# Patient Record
Sex: Male | Born: 1955
Health system: Southern US, Community
[De-identification: ages and names within clinical notes are randomized; demographics above are authoritative.]

## PROBLEM LIST (undated history)

## (undated) DIAGNOSIS — I10 Essential (primary) hypertension: Secondary | ICD-10-CM

## (undated) DIAGNOSIS — F32A Depression, unspecified: Secondary | ICD-10-CM

## (undated) DIAGNOSIS — I4821 Permanent atrial fibrillation: Secondary | ICD-10-CM

## (undated) DIAGNOSIS — I2510 Atherosclerotic heart disease of native coronary artery without angina pectoris: Secondary | ICD-10-CM

## (undated) DIAGNOSIS — Z9889 Other specified postprocedural states: Secondary | ICD-10-CM

## (undated) DIAGNOSIS — E119 Type 2 diabetes mellitus without complications: Secondary | ICD-10-CM

## (undated) DIAGNOSIS — I499 Cardiac arrhythmia, unspecified: Secondary | ICD-10-CM

## (undated) DIAGNOSIS — C4490 Unspecified malignant neoplasm of skin, unspecified: Secondary | ICD-10-CM

## (undated) DIAGNOSIS — I5030 Unspecified diastolic (congestive) heart failure: Secondary | ICD-10-CM

## (undated) DIAGNOSIS — I4891 Unspecified atrial fibrillation: Secondary | ICD-10-CM

## (undated) DIAGNOSIS — K5792 Diverticulitis of intestine, part unspecified, without perforation or abscess without bleeding: Secondary | ICD-10-CM

## (undated) DIAGNOSIS — C801 Malignant (primary) neoplasm, unspecified: Secondary | ICD-10-CM

## (undated) DIAGNOSIS — J45909 Unspecified asthma, uncomplicated: Secondary | ICD-10-CM

## (undated) DIAGNOSIS — R112 Nausea with vomiting, unspecified: Secondary | ICD-10-CM

## (undated) DIAGNOSIS — D649 Anemia, unspecified: Secondary | ICD-10-CM

## (undated) DIAGNOSIS — G2581 Restless legs syndrome: Secondary | ICD-10-CM

## (undated) DIAGNOSIS — I509 Heart failure, unspecified: Secondary | ICD-10-CM

## (undated) DIAGNOSIS — G4733 Obstructive sleep apnea (adult) (pediatric): Secondary | ICD-10-CM

## (undated) DIAGNOSIS — M1990 Unspecified osteoarthritis, unspecified site: Secondary | ICD-10-CM

## (undated) DIAGNOSIS — E785 Hyperlipidemia, unspecified: Secondary | ICD-10-CM

## (undated) DIAGNOSIS — I209 Angina pectoris, unspecified: Secondary | ICD-10-CM

## (undated) DIAGNOSIS — H269 Unspecified cataract: Secondary | ICD-10-CM

## (undated) DIAGNOSIS — F419 Anxiety disorder, unspecified: Secondary | ICD-10-CM

## (undated) DIAGNOSIS — G4700 Insomnia, unspecified: Secondary | ICD-10-CM

## (undated) DIAGNOSIS — K635 Polyp of colon: Secondary | ICD-10-CM

## (undated) DIAGNOSIS — K279 Peptic ulcer, site unspecified, unspecified as acute or chronic, without hemorrhage or perforation: Secondary | ICD-10-CM

## (undated) DIAGNOSIS — R251 Tremor, unspecified: Secondary | ICD-10-CM

## (undated) DIAGNOSIS — R0600 Dyspnea, unspecified: Secondary | ICD-10-CM

## (undated) DIAGNOSIS — J189 Pneumonia, unspecified organism: Secondary | ICD-10-CM

## (undated) DIAGNOSIS — J329 Chronic sinusitis, unspecified: Secondary | ICD-10-CM

## (undated) DIAGNOSIS — Z87442 Personal history of urinary calculi: Secondary | ICD-10-CM

## (undated) DIAGNOSIS — I480 Paroxysmal atrial fibrillation: Secondary | ICD-10-CM

## (undated) DIAGNOSIS — J449 Chronic obstructive pulmonary disease, unspecified: Secondary | ICD-10-CM

## (undated) DIAGNOSIS — T8859XA Other complications of anesthesia, initial encounter: Secondary | ICD-10-CM

## (undated) DIAGNOSIS — K219 Gastro-esophageal reflux disease without esophagitis: Secondary | ICD-10-CM

## (undated) HISTORY — DX: Insomnia, unspecified: G47.00

## (undated) HISTORY — DX: Polyp of colon: K63.5

## (undated) HISTORY — DX: Restless legs syndrome: G25.81

## (undated) HISTORY — PX: ROTATOR CUFF REPAIR: SHX139

## (undated) HISTORY — DX: Atherosclerotic heart disease of native coronary artery without angina pectoris: I25.10

## (undated) HISTORY — DX: Anxiety disorder, unspecified: F41.9

## (undated) HISTORY — DX: Unspecified cataract: H26.9

## (undated) HISTORY — DX: Chronic sinusitis, unspecified: J32.9

## (undated) HISTORY — DX: Unspecified asthma, uncomplicated: J45.909

## (undated) HISTORY — DX: Hyperlipidemia, unspecified: E78.5

## (undated) HISTORY — DX: Essential (primary) hypertension: I10

## (undated) HISTORY — DX: Obstructive sleep apnea (adult) (pediatric): G47.33

## (undated) HISTORY — DX: Peptic ulcer, site unspecified, unspecified as acute or chronic, without hemorrhage or perforation: K27.9

## (undated) HISTORY — DX: Paroxysmal atrial fibrillation: I48.0

---

## 1984-08-29 HISTORY — PX: HERNIA REPAIR: SHX51

## 1998-11-16 ENCOUNTER — Encounter: Payer: Self-pay | Admitting: Emergency Medicine

## 1998-11-16 ENCOUNTER — Inpatient Hospital Stay (HOSPITAL_COMMUNITY): Admission: EM | Admit: 1998-11-16 | Discharge: 1998-11-17 | Payer: Self-pay | Admitting: Emergency Medicine

## 2001-01-29 ENCOUNTER — Emergency Department (HOSPITAL_COMMUNITY): Admission: EM | Admit: 2001-01-29 | Discharge: 2001-01-29 | Payer: Self-pay | Admitting: *Deleted

## 2001-05-30 ENCOUNTER — Emergency Department (HOSPITAL_COMMUNITY): Admission: EM | Admit: 2001-05-30 | Discharge: 2001-05-31 | Payer: Self-pay | Admitting: Emergency Medicine

## 2004-10-27 DIAGNOSIS — I480 Paroxysmal atrial fibrillation: Secondary | ICD-10-CM

## 2004-10-27 HISTORY — DX: Paroxysmal atrial fibrillation: I48.0

## 2004-10-28 ENCOUNTER — Observation Stay (HOSPITAL_COMMUNITY): Admission: EM | Admit: 2004-10-28 | Discharge: 2004-10-29 | Payer: Self-pay | Admitting: Emergency Medicine

## 2004-10-28 ENCOUNTER — Encounter (INDEPENDENT_AMBULATORY_CARE_PROVIDER_SITE_OTHER): Payer: Self-pay | Admitting: *Deleted

## 2004-10-29 ENCOUNTER — Encounter (INDEPENDENT_AMBULATORY_CARE_PROVIDER_SITE_OTHER): Payer: Self-pay | Admitting: *Deleted

## 2004-10-29 ENCOUNTER — Ambulatory Visit: Payer: Self-pay | Admitting: Cardiology

## 2004-10-29 LAB — CONVERTED CEMR LAB
Cholesterol: 169 mg/dL
Free T4: 1.15 ng/dL
LDL Cholesterol: 109 mg/dL
TSH: 1.196 microintl units/mL

## 2004-11-04 ENCOUNTER — Encounter (HOSPITAL_COMMUNITY): Admission: RE | Admit: 2004-11-04 | Discharge: 2004-12-04 | Payer: Self-pay | Admitting: Family Medicine

## 2004-11-15 ENCOUNTER — Ambulatory Visit: Payer: Self-pay | Admitting: Cardiology

## 2005-11-10 ENCOUNTER — Emergency Department (HOSPITAL_COMMUNITY): Admission: EM | Admit: 2005-11-10 | Discharge: 2005-11-10 | Payer: Self-pay | Admitting: Emergency Medicine

## 2005-12-06 ENCOUNTER — Emergency Department (HOSPITAL_COMMUNITY): Admission: EM | Admit: 2005-12-06 | Discharge: 2005-12-07 | Payer: Self-pay | Admitting: Emergency Medicine

## 2005-12-09 ENCOUNTER — Ambulatory Visit (HOSPITAL_COMMUNITY): Admission: RE | Admit: 2005-12-09 | Discharge: 2005-12-09 | Payer: Self-pay | Admitting: Orthopedic Surgery

## 2008-10-09 ENCOUNTER — Emergency Department (HOSPITAL_COMMUNITY): Admission: EM | Admit: 2008-10-09 | Discharge: 2008-10-10 | Payer: Self-pay | Admitting: Emergency Medicine

## 2008-10-14 ENCOUNTER — Emergency Department (HOSPITAL_COMMUNITY): Admission: EM | Admit: 2008-10-14 | Discharge: 2008-10-14 | Payer: Self-pay | Admitting: Family Medicine

## 2008-10-20 ENCOUNTER — Ambulatory Visit: Payer: Self-pay | Admitting: Cardiology

## 2009-01-27 ENCOUNTER — Encounter (INDEPENDENT_AMBULATORY_CARE_PROVIDER_SITE_OTHER): Payer: Self-pay | Admitting: *Deleted

## 2009-05-27 ENCOUNTER — Emergency Department (HOSPITAL_COMMUNITY): Admission: EM | Admit: 2009-05-27 | Discharge: 2009-05-27 | Payer: Self-pay | Admitting: Family Medicine

## 2009-10-05 ENCOUNTER — Emergency Department (HOSPITAL_COMMUNITY): Admission: EM | Admit: 2009-10-05 | Discharge: 2009-10-05 | Payer: Self-pay | Admitting: Family Medicine

## 2009-12-27 DIAGNOSIS — G4733 Obstructive sleep apnea (adult) (pediatric): Secondary | ICD-10-CM

## 2009-12-27 HISTORY — DX: Obstructive sleep apnea (adult) (pediatric): G47.33

## 2010-01-13 ENCOUNTER — Encounter (INDEPENDENT_AMBULATORY_CARE_PROVIDER_SITE_OTHER): Payer: Self-pay | Admitting: *Deleted

## 2010-01-15 ENCOUNTER — Encounter: Payer: Self-pay | Admitting: Adult Health

## 2010-01-15 ENCOUNTER — Ambulatory Visit: Payer: Self-pay | Admitting: Cardiology

## 2010-01-16 ENCOUNTER — Encounter: Payer: Self-pay | Admitting: Adult Health

## 2010-01-16 ENCOUNTER — Encounter (INDEPENDENT_AMBULATORY_CARE_PROVIDER_SITE_OTHER): Payer: Self-pay | Admitting: *Deleted

## 2010-01-16 LAB — CONVERTED CEMR LAB
ALT: 26 units/L
AST: 18 units/L
AST: 18 units/L
Albumin: 4.2 g/dL
Alkaline Phosphatase: 69 units/L
HDL: 32 mg/dL
LDL Cholesterol: 100 mg/dL
Total Protein: 6.5 g/dL
Triglycerides: 190 mg/dL

## 2010-01-19 LAB — CONVERTED CEMR LAB
ALT: 26 units/L (ref 0–53)
AST: 18 units/L (ref 0–37)
Albumin: 4.2 g/dL (ref 3.5–5.2)
Alkaline Phosphatase: 69 units/L (ref 39–117)
Bilirubin, Direct: 0.1 mg/dL (ref 0.0–0.3)
Indirect Bilirubin: 0.4 mg/dL (ref 0.0–0.9)
Total Protein: 6.5 g/dL (ref 6.0–8.3)
VLDL: 38 mg/dL (ref 0–40)

## 2010-01-21 ENCOUNTER — Ambulatory Visit: Payer: Self-pay | Admitting: Cardiology

## 2010-01-21 ENCOUNTER — Ambulatory Visit (HOSPITAL_COMMUNITY): Admission: RE | Admit: 2010-01-21 | Discharge: 2010-01-21 | Payer: Self-pay | Admitting: Cardiology

## 2010-01-21 ENCOUNTER — Encounter: Payer: Self-pay | Admitting: Cardiology

## 2010-01-26 ENCOUNTER — Ambulatory Visit: Admission: RE | Admit: 2010-01-26 | Discharge: 2010-01-26 | Payer: Self-pay | Admitting: Cardiology

## 2010-01-26 ENCOUNTER — Encounter: Payer: Self-pay | Admitting: Pulmonary Disease

## 2010-01-27 ENCOUNTER — Ambulatory Visit: Payer: Self-pay | Admitting: Cardiology

## 2010-02-01 ENCOUNTER — Encounter: Payer: Self-pay | Admitting: Cardiology

## 2010-02-02 ENCOUNTER — Ambulatory Visit: Payer: Self-pay | Admitting: Pulmonary Disease

## 2010-02-05 ENCOUNTER — Encounter (INDEPENDENT_AMBULATORY_CARE_PROVIDER_SITE_OTHER): Payer: Self-pay

## 2010-02-23 ENCOUNTER — Encounter (INDEPENDENT_AMBULATORY_CARE_PROVIDER_SITE_OTHER): Payer: Self-pay | Admitting: *Deleted

## 2010-02-23 DIAGNOSIS — I482 Chronic atrial fibrillation, unspecified: Secondary | ICD-10-CM | POA: Insufficient documentation

## 2010-02-23 DIAGNOSIS — E785 Hyperlipidemia, unspecified: Secondary | ICD-10-CM

## 2010-02-24 ENCOUNTER — Ambulatory Visit: Payer: Self-pay | Admitting: Cardiology

## 2010-03-12 ENCOUNTER — Encounter: Payer: Self-pay | Admitting: Cardiology

## 2010-04-15 ENCOUNTER — Ambulatory Visit: Payer: Self-pay | Admitting: Cardiology

## 2010-04-30 ENCOUNTER — Encounter (INDEPENDENT_AMBULATORY_CARE_PROVIDER_SITE_OTHER): Payer: Self-pay | Admitting: *Deleted

## 2010-05-14 ENCOUNTER — Ambulatory Visit: Payer: Self-pay | Admitting: Internal Medicine

## 2010-05-25 ENCOUNTER — Encounter: Payer: Self-pay | Admitting: Internal Medicine

## 2010-06-02 ENCOUNTER — Emergency Department (HOSPITAL_COMMUNITY): Admission: EM | Admit: 2010-06-02 | Discharge: 2010-06-02 | Payer: Self-pay | Admitting: Family Medicine

## 2010-06-28 ENCOUNTER — Encounter: Payer: Self-pay | Admitting: Internal Medicine

## 2010-07-04 ENCOUNTER — Inpatient Hospital Stay (HOSPITAL_COMMUNITY): Admission: EM | Admit: 2010-07-04 | Discharge: 2010-07-06 | Payer: Self-pay | Admitting: Emergency Medicine

## 2010-07-04 ENCOUNTER — Encounter (INDEPENDENT_AMBULATORY_CARE_PROVIDER_SITE_OTHER): Payer: Self-pay | Admitting: *Deleted

## 2010-07-05 ENCOUNTER — Encounter (INDEPENDENT_AMBULATORY_CARE_PROVIDER_SITE_OTHER): Payer: Self-pay | Admitting: Internal Medicine

## 2010-07-05 LAB — CONVERTED CEMR LAB
BUN: 12 mg/dL
Calcium: 8.3 mg/dL
Chloride: 107 meq/L
Creatinine, Ser: 0.83 mg/dL
GFR calc non Af Amer: >60 mL/min
Glomerular Filtration Rate, Af Am: >60 mL/min/{1.73_m2}
Glucose, Bld: 117 mg/dL
HCT: 41 %
MCV: 84 fL
TSH: 0.848 microintl units/mL

## 2010-08-02 ENCOUNTER — Encounter: Payer: Self-pay | Admitting: Internal Medicine

## 2010-08-06 ENCOUNTER — Encounter (INDEPENDENT_AMBULATORY_CARE_PROVIDER_SITE_OTHER): Payer: Self-pay | Admitting: *Deleted

## 2010-08-09 ENCOUNTER — Encounter: Payer: Self-pay | Admitting: Internal Medicine

## 2010-08-25 ENCOUNTER — Telehealth (INDEPENDENT_AMBULATORY_CARE_PROVIDER_SITE_OTHER): Payer: Self-pay | Admitting: *Deleted

## 2010-09-07 ENCOUNTER — Encounter (INDEPENDENT_AMBULATORY_CARE_PROVIDER_SITE_OTHER): Payer: Self-pay | Admitting: *Deleted

## 2010-09-08 ENCOUNTER — Ambulatory Visit
Admission: RE | Admit: 2010-09-08 | Discharge: 2010-09-08 | Payer: Self-pay | Source: Home / Self Care | Attending: Cardiology | Admitting: Cardiology

## 2010-09-08 ENCOUNTER — Encounter: Payer: Self-pay | Admitting: Cardiology

## 2010-09-09 ENCOUNTER — Encounter: Payer: Self-pay | Admitting: Cardiology

## 2010-09-17 ENCOUNTER — Telehealth (INDEPENDENT_AMBULATORY_CARE_PROVIDER_SITE_OTHER): Payer: Self-pay

## 2010-09-28 NOTE — Miscellaneous (Signed)
Summary: holter moniter 01/21/2010  Clinical Lists Changes  Observations: Added new observation of ECHOINTERP:  Study Conclusions    - Left ventricle: The cavity size was normal. Wall thickness was     increased in a pattern of mild LVH. Systolic function was     vigorous. The estimated ejection fraction was in the range of 65%     to 70%. Wall motion was normal; there were no regional wall motion     abnormalities. Doppler parameters are consistent with abnormal     left ventricular relaxation (grade 1 diastolic dysfunction).   - Mitral valve: Trivial regurgitation.   - Left atrium: The atrium was mildly dilated.   - Tricuspid valve: Trivial regurgitation.   - Pericardium, extracardiac: There was no pericardial effusion.   Transthoracic echocardiography. M-mode, complete 2D, spectral   Doppler, and color Doppler. Height: Height: 172.7cm. Height: 68in.   Weight: Weight: 95.3kg. Weight: 209.6lb. Body mass index: BMI:   31.9kg/m^2. Body surface area: BSA: 2.21m^2. Patient status:   Outpatient. Location: Echo laboratory.    ------------------------------------------------------- (01/21/2010 14:03) Added new observation of HOLTERFIND:      HOLTER MONITOR      DATE OF RECORDING:  Jan 21, 2010 - Jan 23, 2010.      REFERRING PHYSICIAN:  Donna Bernard, MD      CLINICAL DATA:  A 55 year old gentleman with paroxysmal atrial   fibrillation.   1. Continuous electrocardiographic recording was maintained for 48       hours, during which the predominant rhythm was normal sinus with       modest sinus tachycardia.  There were episodes of paroxysmal atrial       fibrillation, sustained at times, with generally high heart rates       in the range of 120-150.  A peak heart rate of 177 was reached.       The lowest heart rate was 60 bpm in sinus rhythm.   2. PVCs occurred at rate between 0 and 100 per hour with a mean of 13       per hour.  Some complexes could have represented atrial   fibrillation with aberrancy, but there were definite PVCs as well       including one 3-beat run of ventricular tachycardia.   3. Frequent premature supraventricular complexes were also present       whenever the patient was in sinus rhythm.   4. The patient returned a complete diary of activity, but no symptoms       were reported.  No significant ST-segment elevation or depression       was identified.      IMPRESSION:  Abnormal continuous electrocardiographic recording,   revealing paroxysmal atrial fibrillation, lasting 4 hours at times, with   uncontrolled heart rate while in atrial fibrillation.  Otherwise,   insignificant arrhythmias are noted.  Other findings as described.               Gerrit Friends. Dietrich Pates, MD, [facility]  (01/21/2010 14:03)      Holter Monitor  Procedure date:  01/21/2010  Findings:           HOLTER MONITOR      DATE OF RECORDING:  Jan 21, 2010 - Jan 23, 2010.      REFERRING PHYSICIAN:  Donna Bernard, MD      CLINICAL DATA:  A 55 year old gentleman with paroxysmal atrial   fibrillation.   1. Continuous electrocardiographic recording was maintained for  48       hours, during which the predominant rhythm was normal sinus with       modest sinus tachycardia.  There were episodes of paroxysmal atrial       fibrillation, sustained at times, with generally high heart rates       in the range of 120-150.  A peak heart rate of 177 was reached.       The lowest heart rate was 60 bpm in sinus rhythm.   2. PVCs occurred at rate between 0 and 100 per hour with a mean of 13       per hour.  Some complexes could have represented atrial       fibrillation with aberrancy, but there were definite PVCs as well       including one 3-beat run of ventricular tachycardia.   3. Frequent premature supraventricular complexes were also present       whenever the patient was in sinus rhythm.   4. The patient returned a complete diary of activity, but no symptoms       were  reported.  No significant ST-segment elevation or depression       was identified.      IMPRESSION:  Abnormal continuous electrocardiographic recording,   revealing paroxysmal atrial fibrillation, lasting 4 hours at times, with   uncontrolled heart rate while in atrial fibrillation.  Otherwise,   insignificant arrhythmias are noted.  Other findings as described.               Gerrit Friends. Dietrich Pates, MD, [facility]   Echocardiogram  Procedure date:  01/21/2010  Findings:       Study Conclusions    - Left ventricle: The cavity size was normal. Wall thickness was     increased in a pattern of mild LVH. Systolic function was     vigorous. The estimated ejection fraction was in the range of 65%     to 70%. Wall motion was normal; there were no regional wall motion     abnormalities. Doppler parameters are consistent with abnormal     left ventricular relaxation (grade 1 diastolic dysfunction).   - Mitral valve: Trivial regurgitation.   - Left atrium: The atrium was mildly dilated.   - Tricuspid valve: Trivial regurgitation.   - Pericardium, extracardiac: There was no pericardial effusion.   Transthoracic echocardiography. M-mode, complete 2D, spectral   Doppler, and color Doppler. Height: Height: 172.7cm. Height: 68in.   Weight: Weight: 95.3kg. Weight: 209.6lb. Body mass index: BMI:   31.9kg/m^2. Body surface area: BSA: 2.34m^2. Patient status:   Outpatient. Location: Echo laboratory.    -------------------------------------------------------

## 2010-09-28 NOTE — Letter (Signed)
Summary: DME CPAP/[facility] Care Management  DME CPAP/[facility] Care Management   Imported By: Lester Kasota 06/22/2010 07:34:08  _____________________________________________________________________  External Attachment:    Type:   Image     Comment:   External Document

## 2010-09-28 NOTE — Letter (Signed)
Summary: Generic Letter  Architectural technologist at [facility]  [address], [place] [address]   Phone: [phone]  Fax: [phone]        April 30, 2010 MRN: [id number]    Shaun [name] [address] [place], [place]  [address]    Dear [name],       This is the written information that I promised you.  I hope it will help you in your efforts to stop smoking.    Sincerely, Teressa Lower RN  This letter has been electronically signed by your physician.

## 2010-09-28 NOTE — Letter (Signed)
Summary: [employer] Authorization Notification   [facility] Insurance Authorization Notification   Imported By: Roderic Ovens 03/16/2010 12:09:35  _____________________________________________________________________  External Attachment:    Type:   Image     Comment:   External Document

## 2010-09-28 NOTE — Assessment & Plan Note (Signed)
Summary: sleep apnea/apc   Primary Provider/Referring Provider:  Dr. Lubertha South  CC:  Sleep Consult-Dr. Meda Klinefelter study attached. and CHF Management.  History of Present Illness: May 14, 2010-  55 yo M referred courtesy of Dr Dietrich Pates concerned about sleep apnea. His wife is here and helps with hx. He reports years of waking short of breath- has to sit up to catch his breath. denies choking, wheezing, heartburn with this sensation. As he sits, breathing is unobstructed. he can't sleep on is back without gasping. told that he snores "terribly", falls asleep as he lies down, visibly stops breathing, jerks and gasps without waking. He denies dyspnea, cough or wheeze during the day. Bedtime 10-11PM, up at 530AM. Weight up 20 lbs in last 2 years.  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 Denies sleepiness driving car.  Dr Dietrich Pates treats for HBP and PAF, no MI or CHF. No ENT surgery. NPSG at [facility] on 01/26/10- Severe obstructive apnea, AHI 83/hr   Preventive Screening-Counseling & Management  Alcohol-Tobacco     Smoking Status: current     Smoking Cessation Counseling: yes     Packs/Day: 1ppd     Year Started: age 89     Tobacco Counseling: to quit use of tobacco products  Current Medications (verified): 1)  Diltiazem Hcl Er Beads 240 Mg Xr24h-Cap (Diltiazem Hcl Er Beads) .... Take One Capsule By Mouth Daily 2)  Daily Multi  Tabs (Multiple Vitamins-Minerals) .... Take 1 Tab Daily 3)  Metoprolol Succinate 25 Mg Xr24h-Tab (Metoprolol Succinate) .... Take One Tablet By Mouth Daily 4)  Aspirin 81 Mg Tbec (Aspirin) .... Take One Tablet By Mouth Daily 5)  Claritin 10 Mg Tabs (Loratadine) .... 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 1 pack daily x 40 yrs Alcohol Use - no Drug Use - no  Risk Factors: Smoking Status: current (05/14/2010) Packs/Day: 1ppd (05/14/2010)  Past Medical History: Paroxysmal atrial fibrillation-onset in 3/06; recurring and 09/2008 ASCVD: Cath in 2000-30--40% mid LAD and proximal RCA; normal EF.  Stress nuclear in 2006-subtle      inferoseptal hypoperfusion with reversibility; negative stress EKG; good exercise tolerance Hypertension Tobacco abuse: 40 pack years Nephrolithiasis Sinusitis Insomnia Obstructive Sleep Apnea- 01/26/10- AHI 83/hr  Past Surgical History: Left inguinal herniorrhaphy-1986 Cardiac cath Right shoulder repair  Family History: Father: Myocardial infarction at age 50 treated with primary stenting Mother: Hypertension Siblings: One brother with diabetes and nephrolithiasis; OSA  one sister   Social History: Packs/Day:  1ppd  Review of Systems      See HPI       The patient complains of shortness of breath with activity, irregular heartbeats, acid heartburn, tooth/dental problems, anxiety, hand/feet swelling, and joint stiffness or pain.  The patient denies shortness of breath at rest, productive cough, non-productive cough, coughing up blood, chest pain, indigestion, loss of appetite, weight change, abdominal pain, difficulty swallowing, sore throat, headaches, nasal congestion/difficulty breathing through nose, sneezing, itching, ear ache, depression, rash, change in color of mucus, and fever.    Vital Signs:  Patient profile:   55 year old male  Height:      68 inches Weight:      218.38 pounds BMI:     33.32 O2 Sat:      94 % on Room air Pulse rate:   86 / minute BP sitting:   112 / 72  (left arm) Cuff size:   large  Vitals Entered By: Reynaldo Minium CMA (May 14, 2010 9:20 AM)  O2 Flow:  Room air CC: Sleep Consult-Dr. Rothbart-sleep study attached., CHF Management   Physical Exam  Additional Exam:  General: A/Ox3; pleasant and cooperative, NAD, SKIN: no rash, lesions NODES: no lymphadenopathy HEENT: /AT, EOM- WNL, Conjuctivae- clear, PERRLA, TM-WNL, Nose- clear, Throat- red, tonsils 2-3+., Mallampati  III NECK: Supple w/ fair ROM, JVD- none, normal carotid impulses w/o bruits Thyroid- normal to palpation CHEST: Clear to P&A, slow, quiet airflow HEART: RRR, no m/g/r heard ABDOMEN: Soft and nl; nml bowel sounds; no organomegaly or masses noted, overweight EAV:WUJW, nl pulses, no edema  NEURO: Grossly intact to observation      Impression & Recommendations:  Problem # 1:  SLEEP APNEA (ICD-780.57)  Severe obstructive sleep apnea with insomnia complaint reflecting his frequent arousals and awakenings. Educational talk with him and wife about sleep hygeine, weight loss, driving safety, treatment and medical issues. We will try CPAP autotitration. 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 Discussed.  Problem # 2:  TOBACCO ABUSE (ICD-305.1)  Emphasis on smoking cessation. Support offered. We will get PFT for baseline pulmonary assessment.   Medications Added to Medication List This Visit: 1)  Claritin 10 Mg Tabs (Loratadine) .... Take 1 by mouth once daily 2)  Cpap Start With Auto   Other Orders: Consultation Level IV (11914) DME Referral (DME) Admin 1st Vaccine (78295) Flu Vaccine 26yrs + (62130)  CHF Assessment/Plan:      The patient's current weight is 218.38 pounds.  His previous weight was 213 pounds.    Patient Instructions: 1)  Please schedule a follow-up appointment in 1 month. 2)  See [facility] to set up CPAP and to schedule PFT 3)  It's time to work on getting off those cigarettes. Nicotine 21 mg patches may  help. 4)  cc Dr Dietrich Pates, Dr Gerda Diss Flu Vaccine Consent Questions     Do you have a history of severe allergic reactions to this vaccine? no    Any prior history of allergic reactions to egg and/or gelatin? no    Do you have a sensitivity to the preservative Thimersol? no    Do you have a past history of Guillan-Barre Syndrome? no    Do you currently have an acute febrile illness? no    Have you ever had a severe reaction to latex? no    Vaccine information given and explained to patient? yes    Are you currently pregnant? no    Lot Number:[id number]   Exp Date:02/26/2011   Site Given  Left Deltoid IMease schedule a follow-up appointment in 1 month. 2)  See [facility] to set up CPAP and to schedule PFT 3)  It's time to work on getting off those cigarettes. Nicotine 21 mg patches may help.     Marland Kitchenlbflu

## 2010-09-28 NOTE — Miscellaneous (Signed)
Summary: LABS LIPIDS,TSH,T4 10/29/2004 AND 10/28/2004  Clinical Lists Changes  Observations: Added new observation of LDL: 109 mg/dL (04/54/0981 19:14) Added new observation of HDL: 28 mg/dL (78/29/5621 30:86) Added new observation of TRIGLYC TOT: 158 mg/dL (57/84/6962 95:28) Added new observation of CHOLESTEROL: 169 mg/dL (41/32/4401 02:72) Added new observation of TSH: 1.196 microintl units/mL (10/29/2004 10:29) Added new observation of T4, FREE: 1.15 ng/dL (53/66/4403 47:42)

## 2010-09-28 NOTE — Letter (Signed)
Summary: Appointment - Reminder 2  [facility]. [address]   [place], [place] [address]   Phone: [phone]  Fax: [phone]     August 06, 2010 MRN: [id number]   Shaun [name] [address] [place], [place]  [address]   Dear [name],  Our records indicate that it is time to schedule a follow-up appointment.  Dr. Dietrich Pates         recommended that you follow up with [place] in    10.2011 PAST DUE        . It is very important that we reach you to schedule this appointment. We look forward to participating in your health care needs. Please contact us at the number listed above at your earliest convenience to schedule your appointment.  If you are unable to make an appointment at this time, give [place] a call so we can update our records.     Sincerely,   Glass blower/designer

## 2010-09-28 NOTE — Letter (Signed)
Summary: [place] Results Engineer, agricultural at [facility]  [address], [place] [address]   Phone: [phone]  Fax: [phone]      February 01, 2010 MRN: [id number]   [facility] [address] [place], [place]  [address]   Dear Mr. Beer,  Your test ordered by Selena Batten has been reviewed by your physician (or physician assistant) and was found to be normal or stable. Your physician (or physician assistant) felt no changes were needed at this time.  __X__ Echocardiogram  ____ Cardiac Stress Test  __X__ Lab Work  ____ Peripheral vascular study of arms, legs or neck  ____ CT scan or X-ray  ____ Lung or Breathing test  __X__ Other: Holter Monitor Please continue on current medical treatment.  Thank you.   [place] Bing, MD, F.A.C.C

## 2010-09-28 NOTE — Miscellaneous (Signed)
Summary: labs lipids,liver,01/16/2010  Clinical Lists Changes  Observations: Added new observation of ALBUMIN: 4.2 g/dL (16/05/9603 54:09) Added new observation of PROTEIN, TOT: 6.5 g/dL (81/19/1478 29:56) Added new observation of SGPT (ALT): 26 units/L (01/16/2010 13:59) Added new observation of SGOT (AST): 18 units/L (01/16/2010 13:59) Added new observation of ALK PHOS: 69 units/L (01/16/2010 13:59) Added new observation of BILI DIRECT: 0.1 mg/dL (21/30/8657 84:69) Added new observation of LDL: 100 mg/dL (62/95/2841 32:44) Added new observation of HDL: 32 mg/dL (08/31/7251 66:44) Added new observation of TRIGLYC TOT: 190 mg/dL (03/47/4259 56:38) Added new observation of CHOLESTEROL: 170 mg/dL (75/64/3329 51:88)

## 2010-09-28 NOTE — Assessment & Plan Note (Signed)
Summary: bp check /tmj  Nurse Visit   Vital Signs:  Patient profile:   55 year old male Height:      68 inches Weight:      213 pounds O2 Sat:      96 % on Room air Temp:     97.8 degrees F oral Pulse rate:   77 / minute BP sitting:   149 / 95  (left arm)  Vitals Entered By: Teressa Lower RN (April 15, 2010 9:43 AM)  O2 Flow:  Room air  Current Medications (verified): 1)  Diltiazem Hcl Er Beads 240 Mg Xr24h-Cap (Diltiazem Hcl Er Beads) .... Take One Capsule By Mouth Daily 2)  Daily Multi  Tabs (Multiple Vitamins-Minerals) .... Take 1 Tab Daily 3)  Metoprolol Succinate 25 Mg Xr24h-Tab (Metoprolol Succinate) .... Take One Tablet By Mouth Daily 4)  Aspirin 81 Mg Tbec (Aspirin) .... Take One Tablet By Mouth Daily  Allergies (verified): No Known Drug Allergies  Visit Type:  6 wk nurse visit Primary Provider:  Dr. Lubertha South   History of Present Illness: S: 6 week nurse visit B: no c/o, states heart out of rhythm last week A: ekg performed, NSR, pt didn't bring bp diary, did not stop smoking or start nicotine patches R: asked to bring bp diary tomorrow, handouts on nicotine step program, [place] quit line and tips on quitting smoking to be given to pt on Friday when he returns bp diary __________________________________   04/20/10      Noted.                  [place] Bing, M.D.  04/21/10 I called pt and asked him to bring in bp diary, he stated he would bring it by Friday   Teressa Lower RN  April 21, 2010 8:30 AM 04/23/10 [facility] to remind pt to bring bp diary to office   Teressa Lower RN  April 23, 2010 9:30 AM

## 2010-09-28 NOTE — Assessment & Plan Note (Signed)
Summary: f/u echo and holter monitor/tg  Medications Added DAILY MULTI  TABS (MULTIPLE VITAMINS-MINERALS) take 1 tab daily METOPROLOL SUCCINATE 25 MG XR24H-TAB (METOPROLOL SUCCINATE) Take one tablet by mouth daily ASPIRIN 81 MG TBEC (ASPIRIN) Take one tablet by mouth daily      Allergies Added: NKDA  Visit Type:  Follow-up Primary Provider:  Dr. Lubertha South   History of Present Illness: [name] returns to the office for further assessment and treatment of possible sleep apnea, paroxysmal atrial fibrillation and tobacco abuse.  Since his last visit, there has been no change in his health status.  He continues to experience daytime somnolence and fatigue.  He has no wheezing nor dyspnea.  He smokes one pack of cigarettes per day, but has stopped for up to 3 months in the past.  He has not been taking aspirin or any other antiplatelet agent or anticoagulant.  He has no risk factors for thromboembolism.  Current Medications (verified): 1)  Diltiazem Hcl Er Beads 240 Mg Xr24h-Cap (Diltiazem Hcl Er Beads) .... Take One Capsule By Mouth Daily 2)  Daily Multi  Tabs (Multiple Vitamins-Minerals) .... Take 1 Tab Daily 3)  Metoprolol Succinate 25 Mg Xr24h-Tab (Metoprolol Succinate) .... Take One Tablet By Mouth Daily 4)  Aspirin 81 Mg Tbec (Aspirin) .... Take One Tablet By Mouth Daily  Allergies (verified): No Known Drug Allergies  Past History:  PMH, FH, and Social History reviewed and updated.  Past Medical History: Paroxysmal atrial fibrillation-onset in 3/06; recurring and 09/2008 ASCVD: Cath in 2000-30--40% mid LAD and proximal RCA; normal EF.  Stress nuclear in 2006-subtle      inferoseptal hypoperfusion with reversibility; negative stress EKG; good exercise tolerance Hypertension Tobacco abuse: 40 pack years Nephrolithiasis Sinusitis Insomnia  Social History: Employment: Hospital doctor and landscaping Married with 5 children Tobacco Use - Yes. 1 pack daily x 40  yrs Alcohol Use - no Drug Use - no  Review of Systems  The patient denies anorexia, weight loss, weight gain, hoarseness, chest pain, syncope, dyspnea on exertion, peripheral edema, prolonged cough, headaches, and abdominal pain.    Vital Signs:  Patient profile:   55 year old male Weight:      211 pounds Pulse rate:   84 / minute BP sitting:   158 / 92  (right arm)  Vitals Entered By: Dreama Saa, CNA (February 24, 2010 1:15 PM)  Physical Exam  General:  Mildly overweight; well developed; no acute distress:   Neck-No JVD; no carotid bruits: Lungs-No tachypnea, no rales; mild expiratory rhonchi and prolongation of the expiratory phase Cardiovascular-normal PMI; normal S1 and S2; S4 present Abdomen-BS normal; soft and non-tender without masses or organomegaly:  Musculoskeletal-No deformities, no cyanosis or clubbing: Neurologic-Normal cranial nerves; symmetric strength and tone:  Skin-Warm, no significant lesions: Extremities-Nl distal pulses; no edema:     Impression & Recommendations:  Problem # 1:  HYPERTENSION (ICD-401.1) Blood pressure control is somewhat suboptimal at this visit, but has been fine when checked previously.  Patient will collect additional values at home or at his local pharmacy and return in one month for reassessment by the cardiology nurses.  Problem # 2:  HYPERLIPIDEMIA (ICD-272.4)  Lipid profile was acceptable last month with total cholesterol 170, triglycerides 190, HDL 32 and LDL of 100.  No pharmacologic therapy is required in the absence of documented vascular disease.     Problem # 3:  SLEEP APNEA (ICD-780.57) Sleep study was impressively positive.  Patient requests referral to a specialist  with [facility] and will be seen by Dr. Maple Hudson, who has cared for other members of his family.  Problem # 4:  ATRIAL FIBRILLATION (ICD-427.31) Holter examination revealed asymptomatic episodes of AF with a rapid ventricular response.  Episodes lasted up to 4  hours, and heart rates approached 180 beats per minute.  Metoprolol succinate 50 mg q.d. will be added to his medical regime.  He will call for recurrent symptoms.  My hope is that with treatment of sleep apnea, both paroxysmal atrial fibrillation and hypertension will improve.  Problem # 5:  TOBACCO ABUSE (ICD-305.1) Mrs. Skog  requested our assistance in encouraging her husband to discontinue  use of tobacco products.  Patient agrees to try to accomplish this.  In the past, he relapsed due to anxiety.  We will use transdermal nicotine patches for the first week and then as needed thereafter.  I will reassess this very nice gentleman in 4 months.  Other Orders: Misc. Referral (Misc. Ref)  Patient Instructions: 1)  Your physician recommends that you schedule a follow-up appointment in: 4 MONTHS 2)  You have been referred to DR. YOUNG FOR SLEEP APNEA 3)  Your physician has requested that you regularly monitor and record your blood pressure readings at home.  Please use the same machine at the same time of day to check your readings and record them to bring to your follow-up visit. 4)  Your physician discussed the hazards of tobacco use.  Tobacco use cessation is recommended and techniques and options to help you quit were discussed. 5)  [facility] QUIT LINE 6)  USE NICOTINE PATCH DAILY 21 MG X 1 WEEK  AND AS NEEDED 7)  NURSE VISIT IN 1 MONTH FOR BP CHECK- PLEASE BRING BP DIARY TO NURSE VISIT Prescriptions: METOPROLOL SUCCINATE 25 MG XR24H-TAB (METOPROLOL SUCCINATE) Take one tablet by mouth daily  #30 x 3   Entered by:   Teressa Lower RN   Authorized by:   Kathlen Brunswick, MD, [facility]   Signed by:   Teressa Lower RN on 02/24/2010   Method used:   Electronically to        [facility]* (retail)       [address]       [place], [place]  [address]       Ph: [phone]       Fax: [phone]   RxID:   [id number]   Prevention & Chronic  Care Immunizations   Influenza vaccine: Not documented    Tetanus booster: Not documented    Pneumococcal vaccine: Not documented  Colorectal Screening   Hemoccult: Not documented    Colonoscopy: Not documented  Other Screening   PSA: Not documented   Smoking status: current  (02/24/2009)  Lipids   Total Cholesterol: 170  (01/16/2010)   LDL: 100  (01/16/2010)   LDL Direct: Not documented   HDL: 32  (01/16/2010)   Triglycerides: 190  (01/16/2010)    SGOT (AST): 18  (01/16/2010)   SGPT (ALT): 26  (01/16/2010)   Alkaline phosphatase: 69  (01/16/2010)   Total bilirubin: 0.5  (01/16/2010)  Hypertension   Last Blood Pressure: 158 / 92  (02/24/2010)   Serum creatinine: Not documented   Serum potassium Not documented  Self-Management Support :    Hypertension self-management support: Not documented    Lipid self-management support: Not documented

## 2010-09-28 NOTE — Assessment & Plan Note (Signed)
Summary: rov palpitations    Visit Type:  Follow-up Primary Provider:  Drucilla Chalet  CC:  SOME CHEST DISCOMFORT.  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  He has not been followed by primary physician as well.  On last visit he was found not to need Coumandin as his CHAD's score was 0.  He was advised to have cholesterol studies completed and echo for LV fx.  He did not have those done.  He returns today with complaints of rapid iheart beat intermiitantly occuring everyday.  He knows of no precipitating or alleviating fators.  He states it occurs with and without exertion.  Sometimes occuring at night which awakens him.  He continues to take the diltiazem 240mg  XL daily but has noticed that he is having "break through" irregular HR more often which has become concerning to him.  He denies SOB, but has soreness after the episode, and mild dizziness when this occurs.  He has not had symptoms bad enough to elicit ER visit.  Current Medications (verified): 1)  Diltiazem Hcl Er Beads 240 Mg Xr24h-Cap (Diltiazem Hcl Er Beads) .... Take One Capsule By Mouth Daily PMH-FH-SH reviewed-no changes except otherwise noted  Review of Systems       Rapid HR, intermittant with chest soreness.  All other systems have been reviewed and are negative unless stated above.   Vital Signs:  Patient profile:   55 year old male Height:      68 inches Weight:      210 pounds BMI:     32.05 Pulse rate:   87 / minute BP sitting:   137 / 87  (right arm)  Vitals Entered By: Dreama Saa, CNA (Jan 15, 2010 2:51 PM)  Physical Exam  General:  Well developed, well nourished, in no acute distress. Head:  normocephalic and atraumatic Eyes:  PERRLA/EOM intact; conjunctiva and lids normal. Ears:  TM's intact and clear with normal canals and hearing Nose:  no deformity, discharge, inflammation, or lesions Mouth:  Teeth,  gums and palate normal. Oral mucosa normal. Lungs:  Clear bilaterally to auscultation and percussion. Heart:  RRR with occasional extra systole. No MRG. No carotid bruits. Abdomen:  Bowel sounds positive; abdomen soft and non-tender without masses, organomegaly, or hernias noted. No hepatosplenomegaly. Msk:  Back normal, normal gait. Muscle strength and tone normal. Extremities:  No clubbing or cyanosis. Neurologic:  Alert and oriented x 3. Psych:  Normal affect.   EKG  Procedure date:  01/15/2010  Findings:      Normal sinus rhythm with rate of 88bpm.:  Right bundle branch block.    Impression & Recommendations:  Problem # 1:  ATRIAL FIBRILLATION (ICD-427.31) He has been complaining of frequent episodes of rapid HR despite current medications.  I will place a holter moniitor on him to evaluate rhythum and rates.  He states this occurs everyday, therefore 24-48 hr monitor should be sufficient.  No medication changes at this time.  Echocardiogram will be done for LV fx to r/o tachycardic CM and to check the size fo the right atrium. If abnormaliites warrent stsess test will proceed with this.  May need EP study at some point. Orders: Holter Monitor (Holter Monitor) 2-D Echocardiogram (2D Echo)  Problem # 2:  SLEEP APNEA (ICD-780.57) He and his wife request a sleep study as he is having symptoms of apnea during the night which  is concerning to them.  We will have this conmpleted. Orders: Sleep Study Other (Sleep Study Other)  Problem # 3:  R/O HYPERLIPIDEMIA (ICD-272.4) Lipids and LFT's will be completed as he should have had these done in the past and was lost to follow-up, for resk stratification. Future Orders: T-Lipid Profile [phone]) ... 01/16/2010 T-Hepatic Function [phone]) ... 01/16/2010  Patient Instructions: 1)  Your physician recommends that you schedule a follow-up appointment in: 2 to 3 weeks 2)  Your physician recommends that you return for lab work on  Monday 3)  Your physician has requested that you have an echocardiogram.  Echocardiography is a painless test that uses sound waves to create images of your heart. It provides your doctor with information about the size and shape of your heart and how well your heart's chambers and valves are working.  This procedure takes approximately one hour. There are no restrictions for this procedure. 4)  Your physician has recommended that you wear a holter monitor.  Holter monitors are medical devices that record the heart's electrical activity. Doctors most often use these monitors to diagnose arrhythmias. Arrhythmias are problems with the speed or rhythm of the heartbeat. The monitor is a small, portable device. You can wear one while you do your normal daily activities. This is usually used to diagnose what is causing palpitations/syncope (passing out). 5)  Your physician has recommended that you have a sleep study.  This test records several body functions during sleep, including:  brain activity, eye movement, oxygen and carbon dioxide blood levels, heart rate and rhythm, breathing rate and rhythm, the flow of air through your mouth and nose, snoring, body muscle movements, and chest and belly movement.

## 2010-09-28 NOTE — Miscellaneous (Signed)
**Note De-Identified  Obfuscation** Summary: Sleep Study  Clinical Lists Changes  Observations: Added new observation of SLEEP STUDY: IMPRESSIONS-RECOMMENDATIONS:   1. Severe obstructive sleep apnea/hypopnea syndrome with an apnea-       hypopnea index of 83 events per hour and oxygen desaturation as low       as 81%.  Treatment for this degree of sleep apnea should focus       primarily on       weight loss as well as continuous positive airway pressure.   2. Frequent premature atrial contractions noted throughout.               Barbaraann Share, MD,FCCP  (01/26/2010 16:14)      Sleep Study  Procedure date:  01/26/2010  Findings:      IMPRESSIONS-RECOMMENDATIONS:   1. Severe obstructive sleep apnea/hypopnea syndrome with an apnea-       hypopnea index of 83 events per hour and oxygen desaturation as low       as 81%.  Treatment for this degree of sleep apnea should focus       primarily on       weight loss as well as continuous positive airway pressure.   2. Frequent premature atrial contractions noted throughout.               Barbaraann Share, MD,FCCP

## 2010-09-30 NOTE — Assessment & Plan Note (Signed)
Summary: F4M  Medications Added DILTIAZEM HCL ER BEADS 360 MG XR24H-CAP (DILTIAZEM HCL ER BEADS) Take one capsule by mouth daily CLARITIN 10 MG TABS (LORATADINE) take as needed DIGOXIN 0.25 MG TABS (DIGOXIN) take 2 tablets daily x3 then Take 1 tablet by mouth once a day CHLORTHALIDONE 25 MG TABS (CHLORTHALIDONE) Take 1/2  tablet by mouth daily COMBIVENT 18-103 MCG/ACT AERO (IPRATROPIUM-ALBUTEROL) 2 puffs three times a day      Allergies Added: NKDA  Visit Type:  Follow-up Primary Provider:  Dr. Lubertha South   History of Present Illness: [name] returns to the office as scheduled for continued assessment and treatment of hypertension and paroxysmal atrial fibrillation.  Since last visit, he was seen in the emergency department.  Those records were obtained and reviewed.  He presented in October with bronchitis and in November for atrial fibrillation with a rapid ventricular response.  Plans were made for hospital admission, but this never occurred, I assume because he converted spontaneously out of AF.  His medication was not changed.  Symptomatically, he is doing well.  He is able to detect episodes of atrial fibrillation, but cannot say exactly what he notices.  It sounds as if he is experiencing very minor palpitations.  He has had dyspnea when he bends over to tie his shoes, but is able to walk at a reasonable pace without symptoms.  He's had no chest discomfort, orthopnea nor PND.  He is congratulated on refraining from smoking cigarettes for the past 2 months.     EKG  Procedure date:  09/08/2010  Findings:      Rhythm Strip  Atrial fibrillation with a rapid ventricular response Heart rate of 110 bpm   Current Medications (verified): 1)  Diltiazem Hcl Er Beads 360 Mg Xr24h-Cap (Diltiazem Hcl Er Beads) .... Take One Capsule By Mouth Daily 2)  Daily Multi  Tabs (Multiple Vitamins-Minerals) .... Take 1 Tab Daily 3)  Aspirin 81 Mg Tbec (Aspirin) .... Take One  Tablet By Mouth Daily 4)  Claritin 10 Mg Tabs (Loratadine) .... Take As Needed 5)  Cpap Start With Auto 6)  Digoxin 0.25 Mg Tabs (Digoxin) .... Take 2 Tablets Daily X3 Then Take 1 Tablet By Mouth Once A Day 7)  Chlorthalidone 25 Mg Tabs (Chlorthalidone) .... Take 1/2  Tablet By Mouth Daily 8)  Combivent 18-103 Mcg/act Aero (Ipratropium-Albuterol) .... 2 Puffs Three Times A Day  Allergies (verified): No Known Drug Allergies  Comments:  Nurse/Medical Assistant: patient and i reviewed meds pharmacy is [facility]  Past History:  PMH, FH, and Social History reviewed and updated.  Review of Systems       See history of present illness.  Vital Signs:  Patient profile:   55 year old male Weight:      231 pounds BMI:     35.25 O2 Sat:      95 % Pulse rate:   83 / minute BP sitting:   151 / 79  (left arm)  Vitals Entered By: Dreama Saa, CNA (September 08, 2010 1:04 PM)  Physical Exam  General:  Overweight; well developed; no acute distress:   Neck-No JVD; no carotid bruits: Lungs-No tachypnea, no rales Cardiovascular-irregular rhythm; normal S1 and S2; Abdomen-BS normal; soft and non-tender without masses or organomegaly:  Musculoskeletal-No deformities, no cyanosis or clubbing: Neurologic-Normal cranial nerves; symmetric strength and tone:  Skin-Warm, no significant lesions: Extremities-Nl distal pulses; trace edema:     Impression & Recommendations:  Problem # 1:  TOBACCO ABUSE (ICD-305.1) Patient has discontinued cigarette smoking without pharmacologic assistance.  Unfortunately, he has gained a substantial amount of weight.  I advised him not to worry about his weight until he is clearly finished with tobacco use.  He appears to have a component of chronic obstructive pulmonary disease.  I've advised increased activity, but this may be limited by his lung problems.  I have provided him with a Combivent inhaler to be used 2 puffs t.i.d.  Metoprolol will be  discontinued in case this is causing any significant bronchospasm, although at the low dose being utilized, this is unlikely.  Problem # 2:  HYPERTENSION (ICD-401.1) Blood pressure control is fairly good, but not ideal.  Chlorthalidone 12.5 mg q.d. will be added to his medical regime.  A chemistry profile will be obtained in one month.  Problem # 3:  SLEEP APNEA (ICD-780.57) Patient has been seen by Dr. Maple Hudson, is using a CPAP device at night and notes some improvement in daytime fatigue.  Unfortunately, there does not appear to have been an effect on atrial fibrillation or hypertension.  Problem # 4:  ATRIAL FIBRILLATION (ICD-427.31) Patient is apparently experiencing frequent episodes of atrial fibrillation.  Duration is uncertain.  His risk factors include only hypertension.  In this setting, full anticoagulation is not absolutely mandatory.  I will discuss the pros and cons with him at his next office visit in one month.  Digoxin will be added in an effort to achieve better control of heart rate.  In addition, diltiazem will be increased to 360 mg q.d.  Other Orders: Future Orders: T-Comprehensive Metabolic Panel ([id number]) ... 10/11/2010  Patient Instructions: 1)  Your physician recommends that you schedule a follow-up appointment in: 2)  Your physician has recommended you make the following change in your medication: STOP METOPROLOL, INCREASE DILTIAZEM TO 360MG  DAILY WITH NEXT REFILL, DIGOXIN 0.25MG  TAKE 2 TABLET DAILY X 3 DAYS THEN 1 TABLET DAILY THEREAFTER, CHLORTHALIDONE 25MG    1/2 TABLET DAILY, COMBIVENT INHALER 2 PUFFS three times a day 3)  Your physician has requested that you regularly monitor and record your blood pressure readings at home.  Please use the same machine at the same time of day to check your readings and record them to bring to your follow-up visit. Prescriptions: COMBIVENT 18-103 MCG/ACT AERO (IPRATROPIUM-ALBUTEROL) 2 puffs three times a day  #1 x 3    Entered by:   Teressa Lower RN   Authorized by:   Kathlen Brunswick, MD, [facility]   Signed by:   Teressa Lower RN on 09/08/2010   Method used:   Electronically to        [facility]* (retail)       [address]       [address] [employer] [employer]       [place], [place]  [address]       Ph: [phone]       Fax: [phone]   RxID:   [id number] CHLORTHALIDONE 25 MG TABS (CHLORTHALIDONE) Take 1/2  tablet by mouth daily  #15 x 3   Entered by:   Teressa Lower RN   Authorized by:   Kathlen Brunswick, MD, [facility]   Signed by:   Teressa Lower RN on 09/08/2010   Method used:   Electronically to        [facility]* (retail)       [address]       [address]  Shipping/mailing       [place], [place]  [address]       Ph: [phone]       Fax: [phone]   RxID:   [id number] DIGOXIN 0.25 MG TABS (DIGOXIN) take 2 tablets daily x3 then Take 1 tablet by mouth once a day  #45 x 3   Entered by:   Teressa Lower RN   Authorized by:   Kathlen Brunswick, MD, [facility]   Signed by:   Teressa Lower RN on 09/08/2010   Method used:   Electronically to        [facility]* (retail)       [address]       [address] Shipping/mailing       [place], [place]  [address]       Ph: [phone]       Fax: [phone]   RxID:   [id number] DILTIAZEM HCL ER BEADS 360 MG XR24H-CAP (DILTIAZEM HCL ER BEADS) Take one capsule by mouth daily  #30 x 3   Entered by:   Teressa Lower RN   Authorized by:   Kathlen Brunswick, MD, [facility]   Signed by:   Teressa Lower RN on 09/08/2010   Method used:   Electronically to        [facility]* (retail)       [address]       [address] [place] [employer]mail[address]       [place], [place]  [address]       Ph: [phone]       Fax: [phone]   RxID:   [id number]

## 2010-09-30 NOTE — Progress Notes (Signed)
Summary: RX REFILL PT IS OUT  Medications Added DILT-CD 240 MG XR24H-CAP (DILTIAZEM HCL COATED BEADS) Take 1 tablet by mouth once a day       Phone Note Call from Patient Call back at Home Phone [phone]   Caller: PT Reason for Call: Refill Medication Summary of Call: PT IS OUT OF MEDS AND HAS BEEN FOR A COUPLE DAY, STATES THAT THE PHARMACY HAS BEEN TRYING TO GET THEM FILLED FOR A WHILE. DILTIAZEM 240MG  AND METOPROLOL 25MG  [facility] OUT PT PHARMACY [phone] PHONE NUMBER. Initial call taken by: Faythe Ghee,  August 25, 2010 8:49 AM    New/Updated Medications: DILT-CD 240 MG XR24H-CAP (DILTIAZEM HCL COATED BEADS) Take 1 tablet by mouth once a day Prescriptions: METOPROLOL SUCCINATE 25 MG XR24H-TAB (METOPROLOL SUCCINATE) Take one tablet by mouth daily  #30 x 1   Entered by:   Teressa Lower RN   Authorized by:   Kathlen Brunswick, MD, [facility]   Signed by:   Teressa Lower RN on 08/25/2010   Method used:   Electronically to        [facility]* (retail)       [address]       [address] [place] [employer]mailing       [place], [place]  [address]       Ph: [phone]       Fax: [phone]   RxID:   [id number] DILT-CD 240 MG XR24H-CAP (DILTIAZEM HCL COATED BEADS) Take 1 tablet by mouth once a day  #30 x 1   Entered by:   Teressa Lower RN   Authorized by:   Kathlen Brunswick, MD, [facility]   Signed by:   Teressa Lower RN on 08/25/2010   Method used:   Electronically to        [facility]* (retail)       [address]       [address] [employer]mailing       [place], [place]  [address]       Ph: [phone]       Fax: [phone]   RxID:   [id number]

## 2010-09-30 NOTE — Progress Notes (Signed)
**Note De-Identified Shaun [name] Obfuscation** Summary: cramping in legs and arms   Phone Note Call from Patient   Reason for Call: Talk to Nurse Summary of Call: S: Pt. c/o leg and arm cramps. B: On last OV with Dr. Jana Hakim on 09-08-10 pt. was advised to stop taking Metoprolol, start taking Digoxin 0.25mg  (take 2 tablets po daily X 3 days then 1 tablet daily thereafter, Chlorthalidone 12.5mg  po qd, Combivent inhaler 2 puffs tid and to increase Diltiazem to 360mg  by mouth once daily  A: Pt. states that he has been having cramps in arms and legs that has been bothersome and thinks it maybe a side effect of one of the new medications started at last visit.  R: Pt. advised that we will contact him with Dr. Marvel Plan recommendations. Initial call taken by: Larita Fife Baine Decesare LPN,  September 17, 2010 4:47 PM  Follow-up for Phone Call        Change chlorthalidone to every other day.   Follow-up by: Kathlen Brunswick, MD, Shaun [name],  September 19, 2010 3:45 PM     Appended Document: cramping in legs and arms Medications Added CHLORTHALIDONE 25 MG TABS (CHLORTHALIDONE) take 1/2 tablet by mouth every other day          Phone Note Outgoing Call   Call placed by: Larita Fife Lillian Tigges LPN,  September 20, 2010 8:52 AM Summary of Call: Shaun [name]. Initial call taken by: Larita Fife Abdulwahab Demelo LPN,  September 20, 2010 8:53 AM  Follow-up for Phone Call        Shaun [name]. Follow-up by: Larita Fife Alessander Sikorski LPN,  September 21, 2010 9:34 AM  Additional Follow-up for Phone Call Additional follow up Details #1::        LMOM. Additional Follow-up by: Larita Fife Sunita Demond LPN,  September 21, 2010 2:59 PM    Additional Follow-up for Phone Call Additional follow up Details #2::    spoke with pt, verbalized understanding in med change Follow-up by: Teressa Lower RN,  September 22, 2010 9:21 AM  New/Updated Medications: CHLORTHALIDONE 25 MG TABS (CHLORTHALIDONE) take 1/2 tablet by mouth every other day

## 2010-09-30 NOTE — Letter (Signed)
Summary: [place] [facility], agricultural at [employer]  [address], [place] [address]   Phone: [phone]  Fax: [phone]     September 08, 2010 MRN: [id number]   MCCOY TESTA [address] [place], [place]  [address]      YOUR LAB WORK IS DUE  October 11, 2010 _________________________________________  Please go to [facility], located across the street from [facility] on the second floor.  Hours are Monday - Friday 7am until 7:30pm         Saturday 8am until 12noon    __  DO NOT EAT OR DRINK AFTER MIDNIGHT EVENING PRIOR TO LABWORK  _X_ YOUR LABWORK IS NOT FASTING --YOU MAY EAT PRIOR TO LABWORK

## 2010-09-30 NOTE — Miscellaneous (Signed)
Summary: LABS LIPIDS,LIVER5/21/2011,TSH 07/04/2010  Clinical Lists Changes  Observations: Added new observation of TSH: 0.848 microintl units/mL (07/04/2010 9:48) Added new observation of ALBUMIN: 4.2 g/dL (16/05/9603 5:40) Added new observation of PROTEIN, TOT: 6.5 g/dL (98/06/9146 8:29) Added new observation of SGPT (ALT): 26 units/L (01/16/2010 9:48) Added new observation of SGOT (AST): 18 units/L (01/16/2010 9:48) Added new observation of ALK PHOS: 69 units/L (01/16/2010 9:48) Added new observation of BILI DIRECT: 0.1 mg/dL (56/21/3086 5:78) Added new observation of LDL: 100 mg/dL (46/96/2952 8:41) Added new observation of HDL: 32 mg/dL (32/44/0102 7:25) Added new observation of TRIGLYC TOT: 190 mg/dL (36/64/4034 7:42) Added new observation of CHOLESTEROL: 170 mg/dL (59/56/3875 6:43)

## 2010-10-07 ENCOUNTER — Encounter: Payer: Self-pay | Admitting: Cardiology

## 2010-10-07 LAB — CONVERTED CEMR LAB
ALT: 30 units/L
Albumin: 4.1 g/dL
Alkaline Phosphatase: 59 units/L
Alkaline Phosphatase: 59 units/L (ref 39–117)
BUN: 20 mg/dL (ref 6–23)
CO2: 27 meq/L
Calcium: 8.5 mg/dL
Chloride: 101 meq/L
Chloride: 101 meq/L (ref 96–112)
Creatinine, Ser: 0.87 mg/dL (ref 0.40–1.50)
Glucose, Bld: 180 mg/dL
Sodium: 138 meq/L
Total Protein: 6 g/dL
Total Protein: 6 g/dL (ref 6.0–8.3)

## 2010-10-11 ENCOUNTER — Ambulatory Visit (INDEPENDENT_AMBULATORY_CARE_PROVIDER_SITE_OTHER): Payer: 59 | Admitting: Cardiology

## 2010-10-11 ENCOUNTER — Encounter: Payer: Self-pay | Admitting: Cardiology

## 2010-10-11 ENCOUNTER — Encounter (INDEPENDENT_AMBULATORY_CARE_PROVIDER_SITE_OTHER): Payer: Self-pay | Admitting: *Deleted

## 2010-10-11 DIAGNOSIS — I4891 Unspecified atrial fibrillation: Secondary | ICD-10-CM

## 2010-10-12 ENCOUNTER — Encounter (INDEPENDENT_AMBULATORY_CARE_PROVIDER_SITE_OTHER): Payer: Self-pay | Admitting: *Deleted

## 2010-10-12 ENCOUNTER — Encounter: Payer: Self-pay | Admitting: Cardiology

## 2010-10-20 NOTE — Letter (Signed)
Summary: BP LOG  BP LOG   Imported By: Faythe Ghee 10/12/2010 12:52:31  _____________________________________________________________________  External Attachment:    Type:   Image     Comment:   External Document

## 2010-10-20 NOTE — Letter (Signed)
Summary: [place] [facility] Work [facility], agricultural at [employer]  [address], [place] [address]   Phone: [phone]  Fax: [phone]     October 11, 2010 MRN: [id number]   Shaun [name] [address] [place], [place]  [address]      YOUR LAB WORK IS DUE   December 10, 2010  Please go to [facility], located across the street from [facility] on the second floor.  Hours are Monday - Friday 7am until 7:30pm         Saturday 8am until 12noon      _X_ YOUR LABWORK IS NOT FASTING --YOU MAY EAT PRIOR TO LABWORK

## 2010-10-20 NOTE — Letter (Signed)
Summary: [place] Results Engineer, agricultural at [facility]  [address] [place] [address]   Phone: [phone]  Fax: [phone]      October 12, 2010 MRN: [id number]   Shaun [name] [address] [place], [place]  [address]   Dear Mr. Napoles,  Your test ordered by Selena Batten has been reviewed by your physician (or physician assistant) and was found to be normal or stable. Your physician (or physician assistant) felt no changes were needed at this time.  ____ Echocardiogram  ____ Cardiac Stress Test  __x__ Lab Work  ____ Peripheral vascular study of arms, legs or neck  ____ CT scan or X-ray  ____ Lung or Breathing test  ____ Other:  No change in medical treatment at this time, per Dr. Dietrich Pates.  Thank you, Eban Weick Allyne Gee RN    [place] Bing, MD, Lenise Arena.C.Gaylord Shih, MD, F.A.C.C Lewayne Bunting, MD, F.A.C.C Nona Dell, MD, F.A.C.C Charlton Haws, MD, Lenise Arena.C.C

## 2010-10-20 NOTE — Assessment & Plan Note (Signed)
Summary: 1 mth f/u per checkout on 09/08/10/tg/lv  Medications Added DIGOXIN 0.25 MG TABS (DIGOXIN) take 1 tab daily      Allergies Added: NKDA  Visit Type:  Follow-up Primary Provider:  Dr. Lubertha South   History of Present Illness: [name] returns to the office for continued assessment and treatment of paroxysmal atrial fibrillation, hypertension and hyperlipidemia.  Since his last visit, he has done quite well.  He reports continuing fatigue but no dyspnea.  He has had no orthopnea, PND nor peripheral edema.  He notes occasional palpitations  An increase in diltiazem dose from 240 mg to 360 mg daily was prescribed at his last visit.  Unfortunately, he exhausted his supply of this drug 10 days ago, but has not obtained a new prescription.  He quotes the pharmacist as advising him that it was unusual to take diltiazem and digoxin together.  Current Medications (verified): 1)  Diltiazem Hcl Er Beads 360 Mg Xr24h-Cap (Diltiazem Hcl Er Beads) .... Take One Capsule By Mouth Daily 2)  Daily Multi  Tabs (Multiple Vitamins-Minerals) .... Take 1 Tab Daily 3)  Aspirin 81 Mg Tbec (Aspirin) .... Take One Tablet By Mouth Daily 4)  Claritin 10 Mg Tabs (Loratadine) .... Take As Needed 5)  Cpap Start With Auto 6)  Digoxin 0.25 Mg Tabs (Digoxin) .... Take 1 Tab Daily 7)  Chlorthalidone 25 Mg Tabs (Chlorthalidone) .... Take 1/2 Tablet By Mouth Every Other Day 8)  Combivent 18-103 Mcg/act Aero (Ipratropium-Albuterol) .... 2 Puffs Three Times A Day  Allergies (verified): No Known Drug Allergies  Comments:  Nurse/Medical Assistant: no meds no list patient and i reviewed meds [facility]  Past History:  PMH, FH, and Social History reviewed and updated.  Review of Systems       See history of present illness.  Vital Signs:  Patient profile:   55 year old male Weight:      230 pounds BMI:     35.10 Pulse rate:   55 / minute BP sitting:   141 / 74  (left  arm)  Vitals Entered By: Dreama Saa, CNA (October 11, 2010 2:56 PM)  Physical Exam  General:  Overweight; well developed; no acute distress:   Neck-No JVD; no carotid bruits: Lungs-No tachypnea, no rales Cardiovascular-regular rhythm; normal S1 and S2; modest systolic murmur at the cardiac base Abdomen-BS normal; soft and non-tender without masses or organomegaly:  Musculoskeletal-No deformities, no cyanosis or clubbing: Neurologic-Normal cranial nerves; symmetric strength and tone:  Skin-Warm, no significant lesions: Extremities-Nl distal pulses; trace edema:     Impression & Recommendations:  Problem # 1:  TOBACCO ABUSE (ICD-305.1) Patient has continued to refrain from tobacco use and was warmly congratulated on this accomplishment.  He has gained 20 pounds as a result and now plans to turn his attention to that problem.  Problem # 2:  HYPERTENSION (ICD-401.1) Blood pressure control is good; current medications will be continued.  It is surprising that values are so good despite not taking diltiazem over a period more than 5 times as long as the drug half-life.  A few of his home measurements have been elevated, but values are almost all acceptable.  He will continue to monitor and report significant elevations.  BP today: 141/74 Prior BP: 151/79 (09/08/2010)  Labs Reviewed: K+: 3.7 (10/07/2010)  Creat: : 0.87 (10/07/2010)    Problem # 3:  HYPERLIPIDEMIA (ICD-272.4) Control of hyperlipidemia is excellent in the absence of pharmacologic therapy.  CHOL: 170 (  01/16/2010)   LDL: 100 (01/16/2010)   HDL: 32 (01/16/2010)   TG: 190 (01/16/2010)  Problem # 4:  SLEEP APNEA (ICD-780.57) Patient has been using a CPAP device with benefit, but continues to be troubled by insomnia.  He will discuss the possible use of hypnotics with Dr. Gerda Diss.  Problem # 5:  ATRIAL FIBRILLATION (ICD-427.31) Despite stopping diltiazem, heart rate has not been elevated; however, he appears to be back  in sinus rhythm.  He was cautioned to call for any episodes of dizziness and certainly for falls, hypotension or dizziness.  Other Orders: Future Orders: T-Basic Metabolic Panel [id number]) ... 12/10/2010 T-Digoxin ([id number]) ... 12/10/2010  Patient Instructions: 1)  Your physician recommends that you schedule a follow-up appointment in: 4 MONTHS 2)  Your physician recommends that you return for lab work in:2 MONTHS 3)  Your physician has recommended you make the following change in your medication: DILTIAZEM 360MG  DIALY 4)  Your physician has requested that you increase the amount of potassium in your diet. Please see [facility] handout. 5)  Your physician has requested that you regularly monitor and record your blood pressure readings at home.  Please use the same machine at the same time of day to check your readings and record them to bring to your follow-up visit. 6)  Your physician discussed the importance of regular exercise and recommended that you start or continue a regular exercise program for good health. Prescriptions: DILTIAZEM HCL ER BEADS 360 MG XR24H-CAP (DILTIAZEM HCL ER BEADS) Take one capsule by mouth daily  #30 x 3   Entered by:   Teressa Lower RN   Authorized by:   Kathlen Brunswick, MD, [facility]   Signed by:   Teressa Lower RN on 10/11/2010   Method used:   Electronically to        [facility]* (retail)       [address]       [address] [employer]       [place], [place]  [address]       Ph: [phone]       Fax: [phone]   RxID:   [id number]

## 2010-11-09 LAB — CBC
MCV: 83.5 fL (ref 78.0–100.0)
MCV: 84 fL (ref 78.0–100.0)
Platelets: 160 10*3/uL (ref 150–400)
RBC: 4.87 MIL/uL (ref 4.22–5.81)
RBC: 5.32 MIL/uL (ref 4.22–5.81)
WBC: 6.4 10*3/uL (ref 4.0–10.5)

## 2010-11-09 LAB — COMPREHENSIVE METABOLIC PANEL
AST: 24 U/L (ref 0–37)
Albumin: 3.6 g/dL (ref 3.5–5.2)
Alkaline Phosphatase: 66 U/L (ref 39–117)
Chloride: 107 mEq/L (ref 96–112)
GFR calc Af Amer: >60 mL/min (ref >60)
Potassium: 3.7 mEq/L (ref 3.5–5.1)
Total Bilirubin: 0.6 mg/dL (ref 0.3–1.2)

## 2010-11-09 LAB — CARDIAC PANEL(CRET KIN+CKTOT+MB+TROPI)
CK, MB: 3.1 ng/mL (ref 0.3–4.0)
CK, MB: 3.2 ng/mL (ref 0.3–4.0)
Relative Index: 2.5 (ref 0.0–2.5)
Total CK: 127 U/L (ref 7–232)
Troponin I: 0.02 ng/mL (ref 0.00–0.06)

## 2010-11-09 LAB — DIFFERENTIAL
Basophils Absolute: 0 10*3/uL (ref 0.0–0.1)
Basophils Absolute: 0 10*3/uL (ref 0.0–0.1)
Basophils Relative: 0 % (ref 0–1)
Basophils Relative: 1 % (ref 0–1)
Eosinophils Absolute: 0.1 10*3/uL (ref 0.0–0.7)
Eosinophils Relative: 1 % (ref 0–5)
Lymphocytes Relative: 18 % (ref 12–46)
Lymphs Abs: 1.4 10*3/uL (ref 0.7–4.0)
Monocytes Absolute: 0.7 10*3/uL (ref 0.1–1.0)
Monocytes Absolute: 0.7 10*3/uL (ref 0.1–1.0)
Monocytes Relative: 9 % (ref 3–12)

## 2010-11-09 LAB — BASIC METABOLIC PANEL
Calcium: 8.9 mg/dL (ref 8.4–10.5)
Creatinine, Ser: 0.93 mg/dL (ref 0.4–1.5)
GFR calc Af Amer: >60 mL/min (ref >60)
Potassium: 4.2 mEq/L (ref 3.5–5.1)
Sodium: 138 mEq/L (ref 135–145)

## 2010-11-09 LAB — APTT: aPTT: 28 seconds (ref 24–37)

## 2010-11-09 LAB — POCT CARDIAC MARKERS
CKMB, poc: 1.1 ng/mL (ref 1.0–8.0)
Myoglobin, poc: 41.5 ng/mL (ref 12–200)

## 2010-11-29 ENCOUNTER — Telehealth: Payer: Self-pay

## 2010-11-30 ENCOUNTER — Encounter: Payer: Self-pay | Admitting: Adult Health

## 2010-11-30 ENCOUNTER — Ambulatory Visit (INDEPENDENT_AMBULATORY_CARE_PROVIDER_SITE_OTHER): Payer: 59 | Admitting: Adult Health

## 2010-11-30 DIAGNOSIS — R600 Localized edema: Secondary | ICD-10-CM | POA: Insufficient documentation

## 2010-11-30 DIAGNOSIS — R609 Edema, unspecified: Secondary | ICD-10-CM

## 2010-11-30 DIAGNOSIS — I4891 Unspecified atrial fibrillation: Secondary | ICD-10-CM

## 2010-11-30 DIAGNOSIS — I10 Essential (primary) hypertension: Secondary | ICD-10-CM

## 2010-11-30 MED ORDER — POTASSIUM CHLORIDE CRYS ER 20 MEQ PO TBCR: 20.0000 meq | EXTENDED_RELEASE_TABLET | Freq: Every day | ORAL | Status: DC | PRN

## 2010-11-30 NOTE — Patient Instructions (Signed)
Your physician recommends that you schedule a follow-up appointment in: 3 MONTHS Your physician has recommended you make the following change in your medication:POTASSIUM 1 TABLET TODAY AND AS NEEDED WITH EXTRA DOSE OF DIURETIC Your physician recommends that you weigh, daily, at the same time every day, and in the same amount of clothing. Please record your daily weights on the handout provided and bring it to your next appointment.  LOW SODIUM DIET INSTRUCTIONS .

## 2010-11-30 NOTE — Progress Notes (Signed)
Subjective:    Patient ID: Shaun [name], male    DOB: May 25, 1956, 55 y.o.   MRN: [id number]  HPI Shaun [name] comes today with complaints of fluid retention and swelling in his feet and hands over the weekend. He has a history of hypertension, hyperlipidemia,and atrial fibrillation.  He states that he had swelling in his lower extremities for two days and then in his hands for one day. He called our office and was told to take a full tablet of chlorathalidone in addition to the 1/2 tablet he takes daily.  He admits to some dietary noncompliance to include eating chicken wings and sauce the day before the symptoms started.  He denies dyspnea or chest pain, increased heart rate or palpatations. The extra dose of the diuretic helped with the swelling and he is feeling back to normal.   Review of Systems    Review of systems complete and found to be negative unless listed above Objective:   Physical Exam General: Well developed, well nourished, in no acute distress Head: Eyes PERRLA, No xanthomas.   Normal cephalic and atramatic  Lungs: Clear bilaterally to auscultation and percussion. Heart: HRRR S1 S2, .  Pulses are 2+ & equal.            No carotid bruit. No JVD.  No abdominal bruits. No femoral bruits. Abdomen: Bowel sounds are positive, abdomen soft and non-tender without masses or                  Hernia's noted. Msk:  Back normal, normal gait. Normal strength and tone for age. Extremities: No clubbing, cyanosis or edema.  DP +1 Neuro: Alert and oriented X 3. Psych:  Good affect, responds appropriately    Current Outpatient Prescriptions on File Prior to Visit  Medication Sig Dispense Refill  . potassium chloride SA (K-DUR,KLOR-CON) 20 MEQ tablet Take 1 tablet (20 mEq total) by mouth daily as needed (WITH EXTRA DOSE OF DIURETIC).  30 tablet  1  . Current Outpatient Prescriptions  Medication Sig Dispense Refill  . albuterol-ipratropium (COMBIVENT) 18-103 MCG/ACT inhaler Inhale 2  puffs into the lungs every 6 (six) hours as needed.        Marland Kitchen aspirin 81 MG tablet Take 81 mg by mouth daily.        . chlorthalidone (HYGROTON) 25 MG tablet Take 25 mg by mouth daily. 1/2 tab every other day       . digoxin (LANOXIN) 0.25 MG tablet Take 250 mcg by mouth daily.        Marland Kitchen diltiazem (CARDIZEM CD) 360 MG 24 hr capsule Take 360 mg by mouth daily.        Marland Kitchen loratadine (CLARITIN) 10 MG tablet Take 10 mg by mouth as needed.        . Multiple Vitamins-Minerals (MULTIVITAMIN WITH MINERALS) tablet Take 1 tablet by mouth daily.        . potassium chloride SA (K-DUR,KLOR-CON) 20 MEQ tablet Take 1 tablet (20 mEq total) by mouth daily as needed (WITH EXTRA DOSE OF DIURETIC).  30 tablet  1  . DISCONTD: TAZTIA XT 360 MG 24 hr capsule        Past Medical History  Diagnosis Date  . Paroxysmal a-fib     on set 10/2004 ;recurring 09/2008  . ASCVD (arteriosclerotic cardiovascular disease)     cath in 2000-30-40% mid LAD and proximal RCA;normal EF. stress nuclear in 2006-subtleinferoseptal  . Hypertension   . Tobacco abuse  40 pack years  . Sinusitis   . Insomnia   . Obstructive sleep apnea     01/26/2010 AHI 83/hr   Family History  Problem Relation Age of Onset  . Hypertension Mother   . Heart attack Father   . Diabetes Brother   .Su Assessment & Plan:

## 2010-11-30 NOTE — Assessment & Plan Note (Signed)
Rate is controlled at present.  No changes in his medications.

## 2010-12-14 LAB — CBC
HCT: 46.1 % (ref 39.0–52.0)
Hemoglobin: 15.9 g/dL (ref 13.0–17.0)
RBC: 5.43 MIL/uL (ref 4.22–5.81)
RDW: 13.2 % (ref 11.5–15.5)

## 2010-12-14 LAB — DIFFERENTIAL
Basophils Absolute: 0 10*3/uL (ref 0.0–0.1)
Eosinophils Relative: 1 % (ref 0–5)
Lymphocytes Relative: 26 % (ref 12–46)
Monocytes Absolute: 0.9 10*3/uL (ref 0.1–1.0)
Monocytes Relative: 9 % (ref 3–12)

## 2010-12-14 LAB — BASIC METABOLIC PANEL
CO2: 24 mEq/L (ref 19–32)
GFR calc non Af Amer: >60 mL/min (ref >60)
Glucose, Bld: 85 mg/dL (ref 70–99)
Potassium: 3.8 mEq/L (ref 3.5–5.1)
Sodium: 141 mEq/L (ref 135–145)

## 2011-01-11 NOTE — Assessment & Plan Note (Signed)
[facility]                       [place] CARDIOLOGY OFFICE NOTE   NAME:Schenk, AVYAY COGER                   MRN:          [id number]  DATE:10/20/2008                            DOB:          Sep 11, 1955    PRIMARY CARE PHYSICIAN:  Dr. Lacretia Nicks. Simone Curia, MD   REASON FOR VISIT:  Post ER visit.   HISTORY OF PRESENT ILLNESS:  Mr. Ciampi is a 55 year old male patient  with history of paroxysmal atrial fibrillation who saw Dr. Dietrich Pates back  in March 2006.  At that time, he was noted to have had a cardiac  catheterization by Dr. Elsie Lincoln in March 2000 that demonstrated 30-40% mid  LAD stenosis and 30% proximal RCA stenosis.  He had been set up for a  stress Myoview study in 2006 after presenting with paroxysmal atrial  fibrillation.  This demonstrated a subtle area of reversible decreased  myocardial perfusion inferior septal wall of the ventricle with normal  LV wall motion.  Dr. Dietrich Pates reviewed this and felt that this was  likely representative of diaphragmatic attenuation.  The patient was  instructed to take an aspirin only as his risk for thromboembolism was  low in regards to his paroxysmal atrial fibrillation.  He was told to  come back on a p.r.n. basis.   He recently presented to the emergency room at [facility] with  shortness of breath and coughing.  He was diagnosed with bronchitis and  placed on p.r.n. albuterol as well as Tussionex cough syrup.  He  presented back a few days later with complaints of shortness of breath.  He was found to be in atrial fibrillation with rapid ventricular rate.  His blood pressure was noted to be 135/88 according to the ER records.  He was seen by Dr. Antonietta Barcelona one of our cardiology fellows.  Upon his  evaluation, the patient converted to normal sinus rhythm spontaneously.  He recommended the ER physician place him on diltiazem 240 mg a day and  he was asked to continue on aspirin.  He follows  up today for further  evaluation.   The patient notes occasional palpitations.  These have been occurring  over the last 6 months.  He describes a fluttering sensation.  He then  develops a feeling of tachy palpitations.  This will go on for probably  an hour or so and then subside.  He notes some left-sided chest  discomfort associated with this that does go up into his jaw.  He cannot  really describe it.  He notes shortness of breath with this as well.  He  denies syncope.  He does note some shortness of breath with exertion,  since he was diagnosed with bronchitis.  Typically, he is NYHA class II.  He sleeps on 1 pillow.  He does occasionally awaken short of breath.  This has only been ongoing, since his recent diagnosis of bronchitis.  He notes that his coughing and shortness of breath are improving on the  therapy that he is on at the moment.  He has had no more fever.  His  highest fever was 101.9.  His temperatures have been normal of late.   PAST MEDICAL HISTORY:  He denies any history of congestive heart  failure, diabetes, or stroke.  He has never had a formal diagnosis of  hypertension as outlined above.  His blood pressure in the emergency  room was really in the pre-hypertension range.  He is status post right  rotator cuff repair and left inguinal herniorrhaphy.   MEDICATIONS:  1. Diltiazem CD 240 mg a day.  2. Albuterol p.r.n.  3. Tussionex p.r.n.  4. Aspirin 81 mg daily.   ALLERGIES:  No known drug allergies.   SOCIAL HISTORY:  He is a smoker.  He smoked 1 pack per day for last 30  years.  He denies alcohol abuse.  He is unemployed.  He is married and  has 5 children.   FAMILY HISTORY:  Significant for CAD.  His father had myocardial  infarction, age 39.   REVIEW OF SYSTEMS:  Please see HPI.  Denies melena, hematochezia,  hematuria, or dysuria.  Rest of the review of systems are negative.   PHYSICAL EXAMINATION:  GENERAL:  He is a well-nourished and well-   developed male in no distress.  VITAL SIGNS:  Blood pressure 122/80, pulse 83, weight 211 pounds.  HEENT:  Normal.  NECK:  Without JVD.  LYMPH:  Without lymphadenopathy.  ENDOCRINE:  Without any thyromegaly.  CARDIAC:  Normal S1 and S2.  Regular rate and rhythm without murmur.  LUNGS:  Clear to auscultation bilaterally.  ABDOMEN:  Soft and nontender.  EXTREMITIES:  Without edema.  NEUROLOGIC:  He is alert and oriented x3.  Cranial nerves II through XII  are grossly intact.  VASCULAR:  Without carotid bruits bilaterally.   Electrocardiogram reveals sinus rhythm with a heart rate of 83.  Normal  axis.  No acute changes.   ASSESSMENT/PLAN:  Mr. Schulenburg is a 55 year old male with paroxysmal  atrial fibrillation.  His CHADS2 score is possibly 1.  However, he has  never really had a formal diagnosis of hypertension.  His blood pressure  is stable on his current dose of diltiazem.  Given his low  thromboembolic risk factors, he only requires continued aspirin therapy.  We will continue him on his diltiazem.  The patient was also interviewed  and examined by Dr. Dietrich Pates.  We plan to obtain a TSH to check his  thyroid function as well as fasting lipid panel.  Given his prior  history of mild nonobstructive coronary disease, if his LDL is greater  than 70, we should consider possibly placing him on a statin.  His last  echocardiogram in March 2006 demonstrated normal LV function and mild  left atrial enlargement.  He had mild LVH as well.  After further review  with  Dr. Dietrich Pates, it is not felt that repeat echocardiography is required at  this time.  He will follow up with Dr. Dietrich Pates in 6 months or sooner  p.r.n.  He has been asked to discontinue his smoking.      Tereso Newcomer, PA-C  Electronically Signed      Gerrit Friends. Dietrich Pates, MD, [facility]  Electronically Signed   SW/MedQ  DD: 10/20/2008  DT: 10/21/2008  Job #: [id number]   cc:   Donna Bernard, M.D.

## 2011-01-11 NOTE — Assessment & Plan Note (Signed)
[facility]                                 ON-CALL NOTE   NAME:PASCHALLColen, Eltzroth                     MRN:          [id number]  DATE:10/14/2008                            DOB:          [birth year]    Patient was a previous patient of Dr. Dietrich Pates.   I was called to see Mr. Moll in the emergency department for the  onset of rapid palpitations which occurred earlier in the afternoon.  Patient states that he was diagnosed with bronchitis approximately 2  weeks ago, and since that time had been treated with oral beta agonist.  Patient states that while he was at rest, he had the onset of very  rapid, fluttering sensation in his chest.  This seemed to be somewhat  paroxysmal in nature and would come and go.  This evening, an episode  came on that lasted longer, and he presented to the emergency department  for evaluation.   On arrival, his EKG revealed atrial fibrillation with rapid ventricular  response.  Prior to any specific therapy, the patient spontaneously  converted and has remained in normal sinus rhythm.  The patient does  have a prior history of atrial fibrillation; however, is currently on no  AV nodal blocking agent or anti-arrhythmic.  He has continued to take a  baby aspirin daily.   The patient does state that he occasionally feels short, brief episodes  of palpitations, maybe once a month.   RECOMMENDATIONS:  After further discussion with Mr. Perazzo, the  patient requested that he follow up as an outpatient.  I recommended  that patient be initiated on Diltiazem 240 mg Extended Release once  daily.  In addition, I have left a voice mail with the answering service  to establish an outpatient visit within the next 2-3 days.  I also  encouraged Mr. Gill to increase his aspirin dose at 325 mg once  daily.  I also instructed that he should only use his albuterol inhaler  on an as-needed basis.     Audery Amel, MD  Electronically Signed    SHG/MedQ  DD: 10/14/2008  DT: 10/14/2008  Job #: [id number]

## 2011-01-14 NOTE — Procedures (Signed)
[name], [name] NO.:  [id number]   MEDICAL RECORD NO.:  [id number]          PATIENT TYPE:  OBV   LOCATION:  IC10                          FACILITY:  [facility]   PHYSICIAN:  [place] Bing, M.D.  DATE OF BIRTH:  1955-12-04   DATE OF PROCEDURE:  10/29/2004  DATE OF DISCHARGE:  10/29/2004                                  ECHOCARDIOGRAM   REFERRING PHYSICIAN:  1.  Donna Bernard, M.D.  2.  Bricelyn Bing, M.D.   CLINICAL DATA:  A 55 year old gentleman with atrial fibrillation and  coronary disease.   M-MODE:  Aorta 3.2, left atrium 4.2, septum 1.5, posterior wall 1.2, LV  diastole 4.3, LV systole 2.5.   RESULTS:  1.  Technically adequate echocardiographic study.  2.  Mild left atrial enlargement;  normal right atrial size.  3.  Normal right ventricular size;  mild hypertrophy;  normal systolic      function.  4.  Minimal aortic valvular sclerosis.  5.  Normal tricuspid valve;  normal pulmonic valve and proximal pulmonary      artery.  6.  Delicate mitral valve;  flat coaptation without definite prolapse;  mild      to moderate annular calcification;  no regurgitation.  7.  Normal left ventricular size;  mild hypertrophy with disproportionate      involvement of the septum.  Normal regional and global left ventricular      systolic function.      RR/MEDQ  D:  10/31/2004  T:  10/31/2004  Job:  [id number]

## 2011-01-14 NOTE — H&P (Signed)
Shaun [name], Shaun [name] NO.:  [id number]   MEDICAL RECORD NO.:  [id number]          PATIENT TYPE:  OBV   LOCATION:  IC10                          FACILITY:  [facility]   PHYSICIAN:  Vania Rea, M.D. DATE OF BIRTH:  07/10/1956   DATE OF ADMISSION:  10/28/2004  DATE OF DISCHARGE:  LH                                HISTORY & PHYSICAL   CHIEF COMPLAINT:  Irregular heartbeat.   HISTORY OF PRESENT ILLNESS:  This is a 55 year old obese Caucasian man with  a past medical history significant only for chronic insomnia for which he  has been on trazodone for the past 6 to 7 years and also has a history of  consumption of large quantities of caffeinated drinks, although he takes his  coffee decaffeinated.  Was in his baseline state of health, a fairly active  gentleman, when he began to notice irregular beating of his heart with  skipping of the beat this evening.  He called his primary physician's  answering service and they advised him to come to the emergency room.  In  the emergency room, the patient was found to be in atrial fibrillation with  a rate in the 130s but cardiovascularly stable.  He was started on a  Cardizem drip and his rate is currently controlled on the same.   Patient denies any chest pain or shortness of breath.  He denies any heat  intolerance or change in appetite or weight.  He denies any fever, cough or  cold.  He does have occasional sinusitis.  He denies any alcohol  consumption.  He denies any family history of irregular heartbeat.   PAST MEDICAL HISTORY:  1.  Chronic insomnia.  2.  History of kidney stones, remote.  3.  History of herniorrhaphy, remote.  4.  Occasional sinusitis.   MEDICATIONS:  1.  Trazodone 150 mg at bedtime.  2.  Xanax episodically.  3.  Ibuprofen episodically for sinusitis.   ALLERGIES:  No known drug allergies.   SOCIAL HISTORY:  Lives with his wife in [place].  Works at [employer]  and also does  Aeronautical engineer.  Has been smoking one pack of tobacco per day for  about 30 years.  Denies any alcohol or illicit drug use.   FAMILY HISTORY:  Significant for mother living at age 48 with no medical  problems.  Father, age 3, with coronary artery disease status post MI and  diabetes.  He has one brother with diabetes and kidney stones and one sister  is healthy.  Has 5 children in good health.   REVIEW OF SYSTEMS:  CARDIAC:  A 10-point review of systems was significant  only for irregular heartbeat and chronic insomnia, as noted in the admission  history and physical.  GASTROINTESTINAL:  He has no increased stool  frequency.  No blood in the stool.  RESPIRATORY:  No chest pains or  shortness of breath.  NEUROLOGIC:  He has never had syncope or focal  weakness.  GENERAL:  He is having no pains at all currently.   PHYSICAL EXAMINATION:  GENERAL:  An obese middle-aged  Caucasian man.  Looks  somewhat older than his age.  Lying in bed, hoping to be discharged home.  VITALS:  His temperature is 97.  When first arriving in the emergency room,  it is documented as blood pressure of 170/80.  Currently, it is 104/54 on  the Cardizem drip going at 12 mg per hour.  He is saturating at 100% on two  liters.  HEENT:  His pupils are round, equal and reactive to light.  His mucous  membranes are pink, moist and anicteric.  NECK:  His neck is thick.  There is no lymphadenopathy, jugular venous  distention or thyromegaly.  CHEST:  Clear to auscultation.  He does seem to have somewhat prolonged  expirations.  CARDIOVASCULAR:  Irregular rhythm.  ABDOMEN:  Obese, soft and nontender.  EXTREMITIES:  Without edema.  There is no joint deformity.   LABORATORY:  He has a mild leukocytosis, white count of 12.3, with an  absolute neutrophil count of 9.3.  His hemoglobin is 14.8, his MCV is 8.7.  There are no other abnormalities on his CBC.  His BMET and chem-7 are  unremarkable.  His potassium is 3.6.   His  chest x-ray shows no acute abnormalities.   His EKG shows atrial fibrillation with a rate of 132 at the time it was  done.   ASSESSMENT:  1.  New-onset atrial fibrillation with no hemodynamic compromise.  2.  Chronic insomnia.  3.  Obesity.  4.  Tobacco abuse.   PLAN:  1.  Will get his coag indices and D-dimer.  If there is no contraindication,      will start him on Lovenox and Coumadin simultaneously with the plan to      discharge him home on Coumadin in the morning if there are no      contraindications.  2.  Will get a D-dimer.  If it is elevated, will go ahead and get a CT scan      of the chest, although he has no shortness of breath or chest pains.  If      it is normal, a CT scan is really not indicated.  3.  Will complete a set of cardiac enzymes and order a 2D echo.  Will leave      it to his primary care physician whether he wishes to consider a      cardiology consult in the morning for possible cardioversion if we feel      this is new-onset or whether he will be discharged on anticoagulation,      to follow up with a cardiologist in the coming week.  Will also, of      course, check his thyroid function.   Addendum:  D-dimer is normal; coags are normal;     LC/MEDQ  D:  10/28/2004  T:  10/29/2004  Job:  [id number]

## 2011-01-14 NOTE — Procedures (Signed)
NAME:  PRINTICE, HELLMER NO.:  [id number]   MEDICAL RECORD NO.:  [id number]           PATIENT TYPE:   LOCATION:                                 FACILITY:   PHYSICIAN:  Donna Bernard, M.D.     DATE OF BIRTH:   DATE OF PROCEDURE:  11/04/2004  DATE OF DISCHARGE:                                    STRESS TEST   INDICATIONS FOR PROCEDURE:  This patient is a 50-something-year-old white  male with a history of tobacco abuse, family history of heart disease, and a  recent admission to the hospital for new-onset of atrial fibrillation.  The  stress test was performed for risk stratification purposes.   The stress test was performed with a standard Bruce protocol with Cardiolite  augmentation.  Resting EKG revealed a normal sinus rhythm with no  significant ST/T changes.  The patient tolerated the first several stages  surprisingly well.  He had a hypertensive response, as expected.  During the  fourth stage, he reached his submax predicted heart rate.  Approximately one  minute prior to this, the patient was given an infusion of Cardiolite.  His  peak heart rate, which was submax predicted at 146.  There was no  significant ST changes at 0.08 seconds past the J point.   IMPRESSION:  Negative adequate stress test.   PLAN:  Await Cardiolite augmentation.      WSL/MEDQ  D:  11/04/2004  T:  11/04/2004  Job:  [id number]

## 2011-01-20 ENCOUNTER — Other Ambulatory Visit: Payer: Self-pay | Admitting: *Deleted

## 2011-01-20 MED ORDER — CHLORTHALIDONE 25 MG PO TABS: 25.0000 mg | ORAL_TABLET | Freq: Every day | ORAL | Status: DC

## 2011-01-31 ENCOUNTER — Other Ambulatory Visit: Payer: Self-pay | Admitting: *Deleted

## 2011-01-31 MED ORDER — CHLORTHALIDONE 25 MG PO TABS: ORAL_TABLET | ORAL | Status: DC

## 2011-02-03 ENCOUNTER — Emergency Department (HOSPITAL_COMMUNITY): Payer: 59

## 2011-02-03 ENCOUNTER — Inpatient Hospital Stay (HOSPITAL_COMMUNITY)
Admission: EM | Admit: 2011-02-03 | Discharge: 2011-02-05 | DRG: 558 | Disposition: A | Discharge status: Home / Self Care | Payer: 59 | Source: Ambulatory Visit | Attending: Internal Medicine | Admitting: Internal Medicine

## 2011-02-03 DIAGNOSIS — K219 Gastro-esophageal reflux disease without esophagitis: Secondary | ICD-10-CM | POA: Diagnosis present

## 2011-02-03 DIAGNOSIS — I4891 Unspecified atrial fibrillation: Secondary | ICD-10-CM | POA: Diagnosis present

## 2011-02-03 DIAGNOSIS — I10 Essential (primary) hypertension: Secondary | ICD-10-CM | POA: Diagnosis present

## 2011-02-03 DIAGNOSIS — G4733 Obstructive sleep apnea (adult) (pediatric): Secondary | ICD-10-CM | POA: Diagnosis present

## 2011-02-03 DIAGNOSIS — M6282 Rhabdomyolysis: Principal | ICD-10-CM | POA: Diagnosis present

## 2011-02-03 DIAGNOSIS — E119 Type 2 diabetes mellitus without complications: Secondary | ICD-10-CM | POA: Diagnosis present

## 2011-02-03 LAB — URINALYSIS, ROUTINE W REFLEX MICROSCOPIC
Hgb urine dipstick: NEGATIVE
Ketones, ur: NEGATIVE mg/dL
Leukocytes, UA: NEGATIVE
Protein, ur: NEGATIVE mg/dL
Urobilinogen, UA: 0.2 mg/dL (ref 0.0–1.0)

## 2011-02-03 LAB — CBC
HCT: 40.1 % (ref 39.0–52.0)
Hemoglobin: 14 g/dL (ref 13.0–17.0)
MCH: 27 pg (ref 26.0–34.0)
MCV: 77.3 fL — ABNORMAL LOW (ref 78.0–100.0)
RBC: 5.19 MIL/uL (ref 4.22–5.81)
WBC: 12.4 10*3/uL — ABNORMAL HIGH (ref 4.0–10.5)

## 2011-02-03 LAB — HEPATIC FUNCTION PANEL
ALT: 31 U/L (ref 0–53)
AST: 29 U/L (ref 0–37)
Alkaline Phosphatase: 70 U/L (ref 39–117)
Bilirubin, Direct: 0.1 mg/dL (ref 0.0–0.3)
Indirect Bilirubin: 0.4 mg/dL (ref 0.3–0.9)
Total Bilirubin: 0.5 mg/dL (ref 0.3–1.2)

## 2011-02-03 LAB — HEMOGLOBIN A1C: Mean Plasma Glucose: 206 mg/dL — ABNORMAL HIGH (ref <117)

## 2011-02-03 LAB — RAPID URINE DRUG SCREEN, HOSP PERFORMED
Amphetamines: NOT DETECTED
Benzodiazepines: NOT DETECTED
Cocaine: NOT DETECTED

## 2011-02-03 LAB — TSH: TSH: 0.55 u[IU]/mL (ref 0.350–4.500)

## 2011-02-03 LAB — BASIC METABOLIC PANEL
Chloride: 97 mEq/L (ref 96–112)
GFR calc Af Amer: >60 mL/min (ref >60)
GFR calc non Af Amer: >60 mL/min (ref >60)
Potassium: 4.1 mEq/L (ref 3.5–5.1)
Sodium: 133 mEq/L — ABNORMAL LOW (ref 135–145)

## 2011-02-03 LAB — DIFFERENTIAL
Eosinophils Absolute: 0.2 10*3/uL (ref 0.0–0.7)
Lymphocytes Relative: 16 % (ref 12–46)
Lymphs Abs: 1.9 10*3/uL (ref 0.7–4.0)
Monocytes Relative: 7 % (ref 3–12)
Neutro Abs: 9.3 10*3/uL — ABNORMAL HIGH (ref 1.7–7.7)
Neutrophils Relative %: 75 % (ref 43–77)

## 2011-02-03 LAB — CARDIAC PANEL(CRET KIN+CKTOT+MB+TROPI)
CK, MB: 7.7 ng/mL (ref 0.3–4.0)
CK, MB: 7 ng/mL (ref 0.3–4.0)
Total CK: 1408 U/L — ABNORMAL HIGH (ref 7–232)
Total CK: 1765 U/L — ABNORMAL HIGH (ref 7–232)
Troponin I: <0.3 ng/mL (ref <0.30)

## 2011-02-03 LAB — GLUCOSE, CAPILLARY: Glucose-Capillary: 205 mg/dL — ABNORMAL HIGH (ref 70–99)

## 2011-02-03 LAB — PROTIME-INR
INR: 0.92 (ref 0.00–1.49)
Prothrombin Time: 12.6 seconds (ref 11.6–15.2)

## 2011-02-03 LAB — URINE MICROSCOPIC-ADD ON: Urine-Other: NONE SEEN

## 2011-02-03 LAB — TROPONIN I: Troponin I: <0.3 ng/mL (ref <0.30)

## 2011-02-03 LAB — CK TOTAL AND CKMB (NOT AT ARMC): Relative Index: 0.6 (ref 0.0–2.5)

## 2011-02-04 LAB — CBC
HCT: 34.6 % — ABNORMAL LOW (ref 39.0–52.0)
MCH: 27.4 pg (ref 26.0–34.0)
MCHC: 34.1 g/dL (ref 30.0–36.0)
MCV: 80.5 fL (ref 78.0–100.0)
Platelets: 183 10*3/uL (ref 150–400)
RDW: 13.9 % (ref 11.5–15.5)
WBC: 6.9 10*3/uL (ref 4.0–10.5)

## 2011-02-04 LAB — COMPREHENSIVE METABOLIC PANEL
Albumin: 3.3 g/dL — ABNORMAL LOW (ref 3.5–5.2)
Alkaline Phosphatase: 67 U/L (ref 39–117)
BUN: 15 mg/dL (ref 6–23)
CO2: 28 mEq/L (ref 19–32)
Chloride: 102 mEq/L (ref 96–112)
Creatinine, Ser: 0.86 mg/dL (ref 0.4–1.5)
GFR calc non Af Amer: >60 mL/min (ref >60)
Glucose, Bld: 167 mg/dL — ABNORMAL HIGH (ref 70–99)
Potassium: 4.1 mEq/L (ref 3.5–5.1)
Total Bilirubin: 0.3 mg/dL (ref 0.3–1.2)

## 2011-02-04 LAB — DIFFERENTIAL
Eosinophils Absolute: 0.1 10*3/uL (ref 0.0–0.7)
Eosinophils Relative: 2 % (ref 0–5)
Lymphocytes Relative: 25 % (ref 12–46)
Lymphs Abs: 1.7 10*3/uL (ref 0.7–4.0)
Monocytes Absolute: 0.6 10*3/uL (ref 0.1–1.0)

## 2011-02-04 LAB — LIPID PANEL
Cholesterol: 157 mg/dL (ref 0–200)
HDL: 23 mg/dL — ABNORMAL LOW (ref >39)
Total CHOL/HDL Ratio: 6.8 RATIO

## 2011-02-04 LAB — MAGNESIUM: Magnesium: 2.2 mg/dL (ref 1.5–2.5)

## 2011-02-04 LAB — GLUCOSE, CAPILLARY: Glucose-Capillary: 169 mg/dL — ABNORMAL HIGH (ref 70–99)

## 2011-02-05 LAB — GLUCOSE, CAPILLARY

## 2011-02-05 LAB — BASIC METABOLIC PANEL
Calcium: 8.9 mg/dL (ref 8.4–10.5)
Chloride: 104 mEq/L (ref 96–112)
Creatinine, Ser: 0.9 mg/dL (ref 0.4–1.5)
GFR calc Af Amer: >60 mL/min (ref >60)

## 2011-02-05 LAB — CK: Total CK: 1024 U/L — ABNORMAL HIGH (ref 7–232)

## 2011-02-05 NOTE — Discharge Summary (Signed)
NAMEALFORD, Shaun [name] NO.:  [id number]  MEDICAL RECORD NO.:  [id number]  LOCATION:  A308                          FACILITY:  [facility]  PHYSICIAN:  Isidor Holts, M.D.  DATE OF BIRTH:  1956-03-13  DATE OF ADMISSION:  02/03/2011 DATE OF DISCHARGE:  06/09/2012LH                              DISCHARGE SUMMARY   PRIMARY MD:  Donna Bernard, MD  PRIMARY CARDIOLOGIST:  Gerrit Friends. Dietrich Pates, MD, [facility]  DISCHARGE DIAGNOSES: 1. Mild acute rhabdomyolysis, etiology unclear. Likely secondary to     physical exertion. 2. Newly diagnosed type 2 diabetes mellitus. 3. History of paroxysmal atrial fibrillation. 4. Hypertension. 5. Gastroesophageal reflux disease. 6. Obstructive sleep apnea syndrome, on nocturnal continuous positive     airway pressure.  DISCHARGE MEDICATIONS: 1. Glipizide 5 mg p.o. b.i.d. 2. Metformin 500 mg p.o. b.i.d. 3. Advil 200 mg 2 tablets p.o. p.r.n. q.6 h. for pain. 4. Enteric-coated aspirin 81 mg p.o. daily. 5. Coricidin HBP cold and flu 1 tablet p.o. p.r.n. daily for cold     symptoms. 6. Combivent inhaler 2 puffs p.r.n. b.i.d. 7. Digoxin 250 mcg p.o. daily. 8. Diltiazem CD 360 mg p.o. daily. 9. Multivitamins therapeutic 1 tablet p.o. daily.  Note: Potassium chloride and chlorthalidone have been discontinued, until reevaluated by primary MD.  PROCEDURE:  Chest x-ray on February 03, 2011, this showed no evidence of active pulmonary disease.  CONSULTATIONS:  None.  ADMISSION HISTORY:  As in H and P notes of February 03, 2011, dictated by Dr. Osvaldo Shipper.  However, in brief, this is a 55 year old male, with known history of paroxysmal atrial fibrillation, hypertension, GERD, obstructive sleep apnea syndrome on nocturnal CPAP, who works as a Museum/gallery exhibitions officer, presenting with generalized muscle cramps, worse in the lower extremities, in the thigh and right arm, of approximately 2 weeks duration.  On initial evaluation in the emergency department, he  was found to have a total CK of 1362, BUN was 19, and creatinine 0.9.  He was admitted further evaluation, investigation, and management.  CLINICAL COURSE: 1. Acute rhabdomyolysis.  The patient presented as described above and     was found to have mildly elevated total CK of 1362.  There were no     immediate obvious precipitating factors.  No evidence of viral     infection.  He has no history of drug abuse.  Urine drug screen was     negative.  HIV test was negative.  The patient was not on any     obvious culprit medications.  Likely etiology is due to physical     exertion, against background of inadequate hydration and warm     weather.  He was managed with intravenous fluid hydration.  Renal     function remained stable, he showed no evidence of metabolic     acidosis and although there was an initial trend upwards in     creatinine kinase levels to a maximum of 1849 on February 04, 2011, by     the February 05, 2011, there was a definite downward trend with total CK     of 1024.  And the patient was now asymptomatic.  During the course  of his hospitalization, he was treated symptomatically for muscle     aches with NSAIDS, with good effect.  By February 05, 2011, he was     asymptomatic, very keen to be discharged.  We believe that downward     trending CK will continue and would like his primary MD to recheck     his CK levels in the next few days.  2. Newly diagnosed type 2 diabetes mellitus.  The patient has no known     history of diabetes mellitus.  His random blood glucose at     the time of presentation was 281.  His hemoglobin A1c was 8.8.     Therefore, we were confident in establishing a diagnosis of diabetes     mellitus.  He was placed on a carbohydrate-modified diet, sliding-     scale insulin coverage and we were subsequently able to start him on     oral hypoglycemics, with good effect.  He has undergone diabetic     teaching and will follow up with outpatient diabetic  classes on     discharge.  Of note, his lipid profile was as follows, total     cholesterol 157, triglyceride 291, HDL 23, LDL 76 i.e. reasonable     lipid profile has been reassured accordingly.  3. History of paroxysmal atrial fibrillation.  The patient was in     sinus rhythm throughout the course of his hospitalization.  4. Hypertension.  He remained normotensive.  5. GERD.  There were no symptoms referable to this.  6. Obstructive sleep apnea syndrome.  The patient was stable on     nocturnal CPA.  DISPOSITION:  The patient was on February 05, 2011, asymptomatic, very keen to be discharged.  He was considered stable enough for discharge and was discharged accordingly.  ACTIVITY:  As tolerated.  Recommended to increase activity slowly.  DIET:  Heart-healthy/carbohydrate modified. recommended 2-3 liters of oral fluid intake/day.  FOLLOWUP INSTRUCTIONS:  The patient is to follow up with his primary MD, Dr. Simone Curia, in the coming week.  He is instructed to call for an appointment and has verbalized understanding.  SPECIAL INSTRUCTIONS:  Outpatient diabetic teaching classes have been arranged.  Also the patient is recommended to have his primary MD, Dr. Simone Curia, recheck his muscle enzymes in a few days.  This patient is recommended to return to regular duties, on 02/08/2011.    Isidor Holts, M.D.     CO/MEDQ  D:  02/05/2011  T:  02/05/2011  Job:  [id number]  cc:   Donna Bernard, M.D. Fax: [phone]  Gerrit Friends. Dietrich Pates, MD, [facility] [address] [place], [place] [address]  Electronically Signed by Isidor Holts M.D. on 02/05/2011 06:49:33 PM

## 2011-02-06 NOTE — H&P (Signed)
NAMETHEADORE, BLUNCK NO.:  [id number]  MEDICAL RECORD NO.:  [id number]  LOCATION:  A308                          FACILITY:  [facility]  PHYSICIAN:  Osvaldo Shipper, MD     DATE OF BIRTH:  12-02-1955  DATE OF ADMISSION:  02/03/2011 DATE OF DISCHARGE:  LH                             HISTORY & PHYSICAL   PRIMARY CARE PHYSICIAN:  Donna Bernard, MD  CARDIOLOGIST:  Gerrit Friends. Dietrich Pates, MD, [facility]  ADMISSION DIAGNOSES: 1. Acute rhabdomyolysis, etiology unclear. 2. History of atrial fibrillation, not on Coumadin. 3. History of hypertension. 4. Hyperglycemia.  CHIEF COMPLAINT:  Muscle cramps for the past 2 days.  HISTORY OF PRESENT ILLNESS:  The patient is a 55 year old Caucasian male who has a history of AFib, hypertension who was in his usual state of health until about 2 days ago when he started having pain and cramping he describes all over and since last evening they have been affecting mostly his legs, especially in the thigh area and his right arm.  The pain has been severe enough that he has had some nausea but denies any emesis.  The pain is 8/10 in intensity.  He has been having some chest pain too but he feels that it is part of the overall symptoms that he has been experiencing.  He has been a little bit short of breath. Denies any dysuria or any blood in the urine.  Denies any fever, cough, or chills recently.  Denies any exertion or extreme physical activity recently.  Denies any new medications recently.  The pain gets worse when he tries to move or walk.  Denies any trauma.  MEDICATIONS AT HOME: 1. Digoxin 250 mcg daily. 2. Aspirin 81 mg daily. 3. Taztia XT 360 mg daily. 4. Klor-Con 20 mEq. 5. Combivent 2 puffs twice daily. 6. Chlorthalidone 12.5 mg once daily.  He also takes over-the-counter vitamins and occasionally he takes cold medicine, although he has not taken it recently.  He denies being on a statin and he is not on  Coumadin.  ALLERGIES:  No known drug allergies.  PAST MEDICAL HISTORY:  Positive for acid reflux, atrial fibrillation, and hypertension.  PAST SURGICAL HISTORY:  Hernia repair in the 1980s and shoulder surgery in 2007.  SOCIAL HISTORY:  He lives in [place] with his wife and family and works as a Lobbyist.  Stopped smoking in November 2011. Occasional alcohol use.  No illicit drug use.  Does not do any exercise  FAMILY HISTORY:  Positive for heart disease in the form of coronary artery disease in his father.  Mother had breast cancer.  There is history of diabetes in the family.  REVIEW OF SYSTEMS:  GENERAL:  Positive for weakness, malaise.  HEENT: Unremarkable.  CARDIOVASCULAR:  As in HPI.  RESPIRATORY:  Unremarkable. GI:  Unremarkable.  GU:  Unremarkable.  MUSCULOSKELETAL:  As in HPI. NEUROLOGICAL:  Unremarkable.  PSYCHIATRIC:  Unremarkable. DERMATOLOGICAL:  Unremarkable.  Other systems were reviewed and found to be negative.  PHYSICAL EXAMINATION:  VITAL SIGNS:  Temperature is 98.5, blood pressure 142/98, heart rate 87, respiratory rate 22, and saturation 98% on room air. GENERAL:  This is a well-developed,  well-nourished white male in no distress. HEENT:  Head is normocephalic and atraumatic.  Pupils are equal and reacting.  No pallor, no icterus.  Mucous membranes moist.  No oral lesions noted. NECK:  Soft and supple.  No thyromegaly is appreciated. LUNGS:  Clear to auscultation bilaterally with no wheezing, rales, or rhonchi. CARDIOVASCULAR:  S1-S2 is normal and regular.  No S3, S4, rubs, murmurs, or bruits. ABDOMEN:  Soft, nontender, and nondistended.  Bowel sounds present.  No masses or organomegaly is appreciated. GU:  Deferred. MUSCULOSKELETAL:  There is tenderness over both thighs.  No rashes are present.  Peripheral pulses are palpable.  No obvious swelling is noted. NEUROLOGIC:  He is alert.  No focal neurological deficits are present. SKIN:  Does  not reveal any rashes.  LABORATORY DATA:  His white cell count is 12.4, hemoglobin is 14.0, MCV is 77, and platelet count is 231.  Sodium is 133, potassium is 4.1, chloride is 97, bicarb is 24, glucose is 281, BUN is 19, and creatinine is 0.9.  Total CK is 1362, troponin is less than 0.3, and CK-MB 7.7.  IMAGING:  He had a chest x-ray which was nonacute.  He had an EKG and there appears to be a sinus rhythm with first-degree AV block, rate is 91, intervals appear to be in the normal range, no Q-waves, no concerning ST or T-wave changes noted on this EKG.  Actually there is one ST inversion in lead I that is appreciated.  This EKG was compared to one from November and the November EKG showed AFib with RVR.  ASSESSMENT:  This is a 55 year old Caucasian male with a history of atrial fibrillation, hypertension, acid reflux who presents with muscle pain for the last 2 days.  Etiology for rhabdomyolysis is not entirely clear.  None of the medication that he is on has known to cause this. He has not had any recent illnesses or sickness.  He also has hyperglycemia which is new for him.  He does not have a history of diabetes.  PLAN: 1. Acute rhabdomyolysis will be treated with IV fluids.  The cause is     unclear.  We will check a phosphorus level.  We will check LFTs and     check CK levels on a daily basis.  We will check a urine drug     screen as well. 2. Hyperglycemia.  We will check HbA1c, monitor sugar on a daily     basis.  He could have diabetes, so he will need to be treated. 3. History of AFib, he is in sinus rhythm currently.  Continue with     Cardizem and digoxin.  We will check a digoxin level. 4. History of hypertension.  Continue with Peyton Bottoms for now and hold off     on the chlorthalidone for now. 5. DVT prophylaxis will be initiated.  Further management decisions will depend on results of further testing and patient's response to treatment.  Osvaldo Shipper,  MD     GK/MEDQ  D:  02/03/2011  T:  02/03/2011  Job:  [id number]  cc:   Donna Bernard, M.D. Fax: [phone]  Gerrit Friends. Dietrich Pates, MD, [facility] [address] [place], [place] [address]  Electronically Signed by Osvaldo Shipper MD on 02/06/2011 07:29:49 PM

## 2011-02-09 ENCOUNTER — Ambulatory Visit (INDEPENDENT_AMBULATORY_CARE_PROVIDER_SITE_OTHER): Payer: 59 | Admitting: Cardiology

## 2011-02-09 ENCOUNTER — Encounter: Payer: Self-pay | Admitting: Cardiology

## 2011-02-09 DIAGNOSIS — E1122 Type 2 diabetes mellitus with diabetic chronic kidney disease: Secondary | ICD-10-CM | POA: Insufficient documentation

## 2011-02-09 DIAGNOSIS — R252 Cramp and spasm: Secondary | ICD-10-CM | POA: Insufficient documentation

## 2011-02-09 DIAGNOSIS — I10 Essential (primary) hypertension: Secondary | ICD-10-CM

## 2011-02-09 DIAGNOSIS — F17200 Nicotine dependence, unspecified, uncomplicated: Secondary | ICD-10-CM | POA: Insufficient documentation

## 2011-02-09 DIAGNOSIS — I2510 Atherosclerotic heart disease of native coronary artery without angina pectoris: Secondary | ICD-10-CM | POA: Insufficient documentation

## 2011-02-09 DIAGNOSIS — E1165 Type 2 diabetes mellitus with hyperglycemia: Secondary | ICD-10-CM | POA: Insufficient documentation

## 2011-02-09 DIAGNOSIS — G4700 Insomnia, unspecified: Secondary | ICD-10-CM

## 2011-02-09 DIAGNOSIS — G4733 Obstructive sleep apnea (adult) (pediatric): Secondary | ICD-10-CM

## 2011-02-09 DIAGNOSIS — Z720 Tobacco use: Secondary | ICD-10-CM | POA: Insufficient documentation

## 2011-02-09 DIAGNOSIS — I480 Paroxysmal atrial fibrillation: Secondary | ICD-10-CM

## 2011-02-09 DIAGNOSIS — J329 Chronic sinusitis, unspecified: Secondary | ICD-10-CM

## 2011-02-09 DIAGNOSIS — I4891 Unspecified atrial fibrillation: Secondary | ICD-10-CM

## 2011-02-09 DIAGNOSIS — E119 Type 2 diabetes mellitus without complications: Secondary | ICD-10-CM | POA: Insufficient documentation

## 2011-02-09 DIAGNOSIS — E785 Hyperlipidemia, unspecified: Secondary | ICD-10-CM

## 2011-02-09 MED ORDER — DOXYCYCLINE HYCLATE 100 MG PO TABS: 100.0000 mg | ORAL_TABLET | Freq: Every day | ORAL | Status: DC

## 2011-02-09 MED ORDER — GUAIFENESIN-CODEINE 300-10 MG/5ML PO LIQD: 5.0000 mL | ORAL | Status: DC | PRN

## 2011-02-09 NOTE — Assessment & Plan Note (Signed)
No symptoms to suggest symptomatic coronary disease.  Our approach to this problem will continue to be optimal management of cardiovascular risk factors.

## 2011-02-09 NOTE — Assessment & Plan Note (Signed)
Lipid values have been acceptable in the past in the absence of specific pharmacologic therapy; however, with the development of diabetes, treatment with statins would be desirable.  Until his issues with possible myositis are resolved, I would not be inclined to start these medications.

## 2011-02-09 NOTE — Assessment & Plan Note (Addendum)
Blood pressure control is good, but may worsen now the diuretic has been discontinued.  In light of the patient's problems with muscle cramping, I am not inclined to restart that drug.  He will follow blood pressures at home and call for significant  elevations at which time a non-diuretic antihypertensive medication can be added.

## 2011-02-09 NOTE — Assessment & Plan Note (Addendum)
Although there were no frank abnormalities in serum electrolytes, magnesium was not assessed, and cramping may have resulted from sudden ionic shifts rather than low or elevated absolute levels.  I suspect that CPK elevation reflected damage from excessive muscular contractions rather than an underlying myopathy.  CPK will be repeated in one week to verify a return to normal levels.

## 2011-02-09 NOTE — Progress Notes (Signed)
HPI : Shaun [name] is seen as scheduled for continuing assessment and treatment of hypertension and paroxysmal atrial fibrillation, but was hospitalized just a few days ago for muscle cramping and elevated CPK.  Hospital records were retrieved and reviewed.  He was also found to have new onset diabetes with glucose values above 200 and a hemoglobin A1c of 8.8.  Appropriate therapy has been initiated.  He was advised to discontinue chlorthalidone and potassium chloride.  Except for the CPK elevation, laboratory studies were benign.  Specific gravity of his urine was low suggesting that he was not dehydrated.  He now feels as if he is back to baseline, but has developed a productive cough.  He has chronic class 2-3 dyspnea on exertion.  Current Outpatient Prescriptions on File Prior to Visit  Medication Sig Dispense Refill  . albuterol-ipratropium (COMBIVENT) 18-103 MCG/ACT inhaler Inhale 2 puffs into the lungs every 6 (six) hours as needed.        Marland Kitchen aspirin 81 MG tablet Take 81 mg by mouth daily.        . digoxin (LANOXIN) 0.25 MG tablet Take 250 mcg by mouth daily.        Marland Kitchen diltiazem (CARDIZEM CD) 360 MG 24 hr capsule Take 360 mg by mouth daily.        Marland Kitchen loratadine (CLARITIN) 10 MG tablet Take 10 mg by mouth as needed.        . Multiple Vitamins-Minerals (MULTIVITAMIN WITH MINERALS) tablet Take 1 tablet by mouth daily.        Marland Kitchen DISCONTD: chlorthalidone (HYGROTON) 25 MG tablet 1/2 tab every other day  15 tablet  3  . DISCONTD: potassium chloride SA (K-DUR,KLOR-CON) 20 MEQ tablet Take 1 tablet (20 mEq total) by mouth daily as needed (WITH EXTRA DOSE OF DIURETIC).  30 tablet  1     No Known Allergies    Past medical history, social history, and family history reviewed and updated.  ROS: See history of present illness.  PHYSICAL EXAM: BP 129/71  Pulse 73  Temp 97.8 F (36.6 C)  Ht 5\' 8"  (1.727 m)  Wt 227 lb (102.967 kg)  BMI 34.52 kg/m2  SpO2 95%  General-Well developed; no acute distress;  somewhat disheveled Body habitus-overweight Neck-No JVD; no carotid bruits Lungs-clear lung fields; resonant to percussion Cardiovascular-normal PMI; normal S1 and S2 Abdomen-normal bowel sounds; soft and non-tender without masses or organomegaly Musculoskeletal-No deformities, no cyanosis or clubbing; no muscle tenderness or fasciculations Neurologic-Normal cranial nerves; symmetric strength and tone Skin-Warm, no significant lesions Extremities-distal pulses intact; 1+ ankle edema  ASSESSMENT AND PLAN:

## 2011-02-09 NOTE — Assessment & Plan Note (Signed)
Patient is congratulated on abstaining from cigarette smoking for the past 8 months and encouraged to continue.  Unfortunately, chronic dyspnea has not improved and he now appears to have bronchitis.  A 5 day course of doxycycline was prescribed as well as an antitussive preparation.

## 2011-02-09 NOTE — Patient Instructions (Signed)
Your physician recommends that you schedule a follow-up appointment in: 5 months Your physician has recommended you make the following change in your medication: stop chlorthalidone and potassium, start  doxycycline 100mg  daily x5 days and robitussin with codiene 5cc every 3 hours as needed for cough Your physician recommends that you return for lab work in: 1 week Your physician discussed the importance of regular exercise and recommended that you start or continue a regular exercise program for good health.  Your physician has requested that you regularly monitor and record your blood pressure readings at home. Please use the same machine at the same time of day to check your readings and record them to bring to your follow-up visit. Record on your blood pressure diary- call if more than occasionally >135systolic or >85 diastolic

## 2011-02-09 NOTE — Assessment & Plan Note (Signed)
>>  ASSESSMENT AND PLAN FOR TOBACCO USE DISORDER WRITTEN ON 02/09/2011 12:41 PM BY Kathlen Brunswick, MD  Patient is congratulated on abstaining from cigarette smoking for the past 8 months and encouraged to continue.  Unfortunately, chronic dyspnea has not improved and he now appears to have bronchitis.  A 5 day course of doxycycline was prescribed as well as an antitussive preparation.

## 2011-02-09 NOTE — Assessment & Plan Note (Addendum)
Clinically apparent episodes continue to be rare.  Risk factors for thromboembolism include hypertension and diabetes.  With apparently very infrequent and brief episodes, I would not be inclined to start full anticoagulation; he will continue to take aspirin.

## 2011-03-03 ENCOUNTER — Other Ambulatory Visit: Payer: Self-pay | Admitting: Cardiology

## 2011-05-03 ENCOUNTER — Other Ambulatory Visit: Payer: Self-pay | Admitting: Cardiology

## 2011-07-08 ENCOUNTER — Other Ambulatory Visit: Payer: Self-pay | Admitting: Cardiology

## 2011-07-12 ENCOUNTER — Ambulatory Visit: Payer: 59 | Admitting: Cardiology

## 2011-09-07 ENCOUNTER — Ambulatory Visit (INDEPENDENT_AMBULATORY_CARE_PROVIDER_SITE_OTHER): Payer: 59 | Admitting: Cardiology

## 2011-09-07 ENCOUNTER — Encounter: Payer: Self-pay | Admitting: Cardiology

## 2011-09-07 ENCOUNTER — Encounter: Payer: Self-pay | Admitting: *Deleted

## 2011-09-07 DIAGNOSIS — I2510 Atherosclerotic heart disease of native coronary artery without angina pectoris: Secondary | ICD-10-CM

## 2011-09-07 DIAGNOSIS — E785 Hyperlipidemia, unspecified: Secondary | ICD-10-CM

## 2011-09-07 DIAGNOSIS — I4891 Unspecified atrial fibrillation: Secondary | ICD-10-CM

## 2011-09-07 DIAGNOSIS — Z87891 Personal history of nicotine dependence: Secondary | ICD-10-CM

## 2011-09-07 DIAGNOSIS — E119 Type 2 diabetes mellitus without complications: Secondary | ICD-10-CM

## 2011-09-07 DIAGNOSIS — I10 Essential (primary) hypertension: Secondary | ICD-10-CM

## 2011-09-07 DIAGNOSIS — F17201 Nicotine dependence, unspecified, in remission: Secondary | ICD-10-CM

## 2011-09-07 MED ORDER — SILDENAFIL CITRATE 50 MG PO TABS: ORAL_TABLET | ORAL | Status: DC

## 2011-09-07 MED ORDER — PRAVASTATIN SODIUM 20 MG PO TABS: 20.0000 mg | ORAL_TABLET | Freq: Every day | ORAL | Status: DC

## 2011-09-07 NOTE — Assessment & Plan Note (Addendum)
Home systolics reported as 130 or less. Blood pressure control is excellent; current medications will be continued.

## 2011-09-07 NOTE — Patient Instructions (Signed)
Your physician recommends that you schedule a follow-up appointment in: 1 year  Your physician has recommended you make the following change in your medication:  Viagra 50 mg (1/2 to 2 tablets) as needed Pravastatin 20 mg daily  Your physician recommends that you return for lab work in: 1 month (you will receive a letter)

## 2011-09-07 NOTE — Assessment & Plan Note (Signed)
Palpitations may or may not represent recurrent arrhythmia.  With brief episodes and symptoms limited to palpitations, no further testing or treatment is required.

## 2011-09-07 NOTE — Assessment & Plan Note (Addendum)
Despite diabetes and nonobstructive coronary disease, patient is receiving no lipid lower agent at present.  High triglycerides and low HDL level likely have improved since lipids were last assessed 7 months ago as the result of better control of diabetes.  He also apparently has a myopathy with muscle pain than minimally elevated CPK.  Nonetheless, treatment with statin would afford him benefits.  We will start with a small dose of pravastatin, 20 mg per day and follow lipids and CPK.

## 2011-09-07 NOTE — Assessment & Plan Note (Signed)
Patient continues to have no symptoms referable to coronary artery disease and may well still have nonobstructive disease.  No further diagnostic testing is required, and optimal management of cardiovascular risk factors will continue to be pursued

## 2011-09-07 NOTE — Progress Notes (Signed)
Patient ID: Shaun [name], male   DOB: 1956-01-14, 56 y.o.   MRN: [id number] HPI: Scheduled return visit for this very nice gentleman with nonobstructive coronary disease when last assessed in 2009, paroxysmal atrial fibrillation and multiple cardiovascular risk factors.  Since his last visit, he has done generally well.  He reports class II dyspnea on exertion but no chest discomfort.  Blood pressure control and control of diabetes has been excellent.  Unfortunately, he has resumed smoking cigarettes at the rate of 3/4 pack per day.  He reports recent difficulty achieving adequate erections and wonders if he is healthy enough to use Viagra.  He describes episodic palpitations, none lasting more than a few hours.    Prior to Admission medications   Medication Sig Start Date End Date Taking? Authorizing Provider  albuterol-ipratropium (COMBIVENT) 18-103 MCG/ACT inhaler Inhale 2 puffs into the lungs every 6 (six) hours as needed.     Yes Historical Provider, MD  aspirin 81 MG tablet Take 81 mg by mouth daily.     Yes Historical Provider, MD  Chlorphen-PE-Acetaminophen (CORICIDIN D COLD/FLU/SINUS PO) Take 1 tablet by mouth as needed.     Yes Historical Provider, MD  digoxin (LANOXIN) 0.25 MG tablet Take 1 tablet (250 mcg total) by mouth daily. 05/03/11  Yes Gerrit Friends. Lew Prout, MD  diltiazem (CARDIZEM CD) 360 MG 24 hr capsule Take 360 mg by mouth daily.     Yes Historical Provider, MD  ibuprofen (ADVIL,MOTRIN) 200 MG tablet Take 200 mg by mouth every 6 (six) hours as needed.     Yes Historical Provider, MD  metFORMIN (GLUCOPHAGE) 500 MG tablet Take 500 mg by mouth 2 (two) times daily.  02/05/11  Yes Historical Provider, MD  Multiple Vitamins-Minerals (MULTIVITAMIN WITH MINERALS) tablet Take 1 tablet by mouth daily.     Yes Historical Provider, MD  potassium chloride SA (K-DUR,KLOR-CON) 20 MEQ tablet Take 20 mEq by mouth as needed.   Yes Historical Provider, MD  doxycycline (VIBRA-TABS) 100 MG tablet Take 1  tablet (100 mg total) by mouth daily. 02/09/11 02/19/11  Gerrit Friends. Dietrich Pates, MD    No Known Allergies    Past medical history, social history, and family history reviewed and updated.  ROS: Denies orthopnea, PND, pedal edema, lightheadedness or syncope.  He experiences coughing and sputum production each morning and episodes of bronchitis each winter.  PHYSICAL EXAM: BP 139/72  Pulse 73  Resp 16  Ht 5\' 8"  (1.727 m)  Wt 101.606 kg (224 lb)  BMI 34.06 kg/m2  General-Well developed; no acute distress Body habitus-mildly obese Neck-No JVD; no carotid bruits Lungs-clear lung fields; resonant to percussion; normal I:E ratio Cardiovascular-normal PMI; normal S1 and S2 Abdomen-normal bowel sounds; soft and non-tender without masses or organomegaly Musculoskeletal-No deformities, no cyanosis or clubbing Neurologic-Normal cranial nerves; symmetric strength and tone Skin-Warm, no significant lesions Extremities-distal pulses intact; no edema  ASSESSMENT AND PLAN:  Tingley Bing, MD 09/07/2011 9:45 AM

## 2011-09-07 NOTE — Assessment & Plan Note (Signed)
Patient reports excellent control with CBGs consistently less than 130 and a recent A1c level of 6.6.

## 2011-12-09 ENCOUNTER — Emergency Department (HOSPITAL_COMMUNITY)
Admission: EM | Admit: 2011-12-09 | Discharge: 2011-12-10 | Disposition: A | Discharge status: Home / Self Care | Payer: 59 | Attending: Emergency Medicine | Admitting: Emergency Medicine

## 2011-12-09 ENCOUNTER — Encounter (HOSPITAL_COMMUNITY): Payer: Self-pay | Admitting: Emergency Medicine

## 2011-12-09 DIAGNOSIS — R030 Elevated blood-pressure reading, without diagnosis of hypertension: Secondary | ICD-10-CM | POA: Insufficient documentation

## 2011-12-09 DIAGNOSIS — Z79899 Other long term (current) drug therapy: Secondary | ICD-10-CM | POA: Insufficient documentation

## 2011-12-09 DIAGNOSIS — G4733 Obstructive sleep apnea (adult) (pediatric): Secondary | ICD-10-CM | POA: Insufficient documentation

## 2011-12-09 DIAGNOSIS — R51 Headache: Secondary | ICD-10-CM

## 2011-12-09 DIAGNOSIS — IMO0001 Reserved for inherently not codable concepts without codable children: Secondary | ICD-10-CM

## 2011-12-09 MED ORDER — ACETAMINOPHEN 500 MG PO TABS
1000.0000 mg | ORAL_TABLET | Freq: Once | ORAL | Status: AC
  Administered 2011-12-09: 1000 mg via ORAL
  Filled 2011-12-09: qty 2

## 2011-12-09 NOTE — ED Notes (Signed)
Patient placed on monitor, waiting for edp to see pt.

## 2011-12-09 NOTE — ED Provider Notes (Signed)
History  This chart was scribed for [employer]. Colon Branch, MD by Bennett Scrape and Temilola Ajibulu. This patient was seen in room APA07/APA07 and the patient's care was started at 11:45PM.  CSN: [id number]  Arrival date & time 12/09/11  2158   First MD Initiated Contact with Patient 12/09/11 2319      Chief Complaint  Patient presents with  . Headache     The history is provided by the patient. No language interpreter was used.   Shaun [name] is a 56 y.o. male who presents to the Emergency Department with a h/o HTN, insomnia, ASCVD and paroxysmal atrial fibrillation complaining of approximately 10 hours of gradual onset, gradually improving, constant HA described as throbbing that started while he was in a meeting. The HA is radiating from the top of the head to the frontal area. He reports taking ibuprofen approximately 3 hours after the HA started with gradual improvement in his pain. He also c/o HTN symptoms, stating that he measured his BP at 186/106 during the onset of the HA. BP was measured at 159/86 in the ED. Pt states that he normally takes medications for HTN and reports taking them today as prescribed. He denies any modifying factors but states that he hasn't eaten since breakfast this morning. He reports that he has experienced previous frontally located HAs before but states that this HA was more severe in quality. He denies fever, congestion, visual disturbance, SOB, chest pain, dysuria, back pain, abdominal pain, rash, trouble swallowing, speech difficulty, weakness and confusion as associated symptoms. Pt is an everyday smoker but has no h/o alcohol use.  Pt's PCP is Dr. Lubertha South.   Past Medical History  Diagnosis Date  . Paroxysmal atrial fibrillation 10/2004    onset in 10/2004; recurred 09/2008  . Arteriosclerotic cardiovascular disease (ASCVD)     Nonobstructive; cath in 2000: 30-40% mid LAD and proximal RCA;normal EF. stress nuclear in 2006-subtle inferoseptal  hypoperfusion with reversibility; negative stress EKG; good exercise tolerance  . Hypertension   . Tobacco abuse     40 pack years  . Sinusitis   . Insomnia   . Obstructive sleep apnea 12/2009    01/26/2010 AHI 83/hr    Past Surgical History  Procedure Date  . Hernia repair 1986    Left inguinal  . Rotator cuff repair     Right    Family History  Problem Relation Age of Onset  . Hypertension Mother   . Heart attack Father   . Diabetes Brother     History  Substance Use Topics  . Smoking status: Current Everyday Smoker -- 1.0 packs/day for 32 years    Types: Cigarettes    Last Attempt to Quit: 07/04/2010  . Smokeless tobacco: Never Used  . Alcohol Use: No      Review of Systems   A complete 10 system review of systems was obtained and all systems are negative except as noted in the HPI and PMH.   Allergies  Review of patient's allergies indicates no known allergies.  Home Medications   Current Outpatient Rx  Name Route Sig Dispense Refill  . IPRATROPIUM-ALBUTEROL 18-103 MCG/ACT IN AERO Inhalation Inhale 2 puffs into the lungs every 6 (six) hours as needed.      . ASPIRIN 81 MG PO TABS Oral Take 81 mg by mouth every morning.     Marland Kitchen DIGOXIN 0.25 MG PO TABS Oral Take 250 mcg by mouth every morning.    Marland Kitchen DILTIAZEM HCL  ER COATED BEADS 360 MG PO CP24 Oral Take 360 mg by mouth every evening.     Marland Kitchen METFORMIN HCL 500 MG PO TABS Oral Take 500 mg by mouth 2 (two) times daily.     Marland Kitchen PRAVASTATIN SODIUM 20 MG PO TABS Oral Take 20 mg by mouth every evening.    Merrilee Seashore D COLD/FLU/SINUS PO Oral Take 1 tablet by mouth as needed.      Marland Kitchen DOXYCYCLINE HYCLATE 100 MG PO TABS Oral Take 1 tablet (100 mg total) by mouth daily. 5 tablet 0  . IBUPROFEN 200 MG PO TABS Oral Take 200 mg by mouth every 6 (six) hours as needed.     . MULTI-VITAMIN/MINERALS PO TABS Oral Take 1 tablet by mouth daily.      Marland Kitchen POTASSIUM CHLORIDE CRYS ER 20 MEQ PO TBCR Oral Take 20 mEq by mouth as needed.    Marland Kitchen  SILDENAFIL CITRATE 50 MG PO TABS  Take 1/2 to 2 tablets as needed 10 tablet 6    Triage Vitals: BP 142/76  Pulse 72  Temp(Src) 97.8 F (36.6 C) (Oral)  Resp 15  Ht 5\' 8"  (1.727 m)  Wt 228 lb (103.42 kg)  BMI 34.67 kg/m2  SpO2 94%  Physical Exam  Nursing note and vitals reviewed. Constitutional: He is oriented to person, place, and time. He appears well-developed and well-nourished.  HENT:  Head: Normocephalic and atraumatic.  Eyes: Conjunctivae and EOM are normal.  Neck: Normal range of motion. Neck supple.  Cardiovascular: Normal rate and regular rhythm.  Exam reveals no friction rub.   No murmur heard. Pulmonary/Chest: Effort normal and breath sounds normal.  Abdominal: Soft. Bowel sounds are normal. There is no tenderness. There is no rebound and no guarding.  Musculoskeletal: Normal range of motion. He exhibits no edema and no tenderness.       Pt is right-handed, right hand is mildly stronger than left arm as expected  Neurological: He is alert and oriented to person, place, and time.  Skin: Skin is warm and dry.  Psychiatric: He has a normal mood and affect. His behavior is normal.    ED Course  Procedures (including critical care time)  DIAGNOSTIC STUDIES: Oxygen Saturation is 97% on room air, adequate by my interpretation.    COORDINATION OF CARE: 11:54PM: Discussed ambulating pt to see how it affects his HA and BP and pt agreed. Results for orders placed during the hospital encounter of 12/09/11  CBC      Component Value Range   WBC 9.8  4.0 - 10.5 (K/uL)   RBC 4.67  4.22 - 5.81 (MIL/uL)   Hemoglobin 12.6 (*) 13.0 - 17.0 (g/dL)   HCT 16.1 (*) 09.6 - 52.0 (%)   MCV 80.9  78.0 - 100.0 (fL)   MCH 27.0  26.0 - 34.0 (pg)   MCHC 33.3  30.0 - 36.0 (g/dL)   RDW 04.5  40.9 - 81.1 (%)   Platelets 218  150 - 400 (K/uL)  DIFFERENTIAL      Component Value Range   Neutrophils Relative 62  43 - 77 (%)   Neutro Abs 6.1  1.7 - 7.7 (K/uL)   Lymphocytes Relative 29  12 -  46 (%)   Lymphs Abs 2.9  0.7 - 4.0 (K/uL)   Monocytes Relative 7  3 - 12 (%)   Monocytes Absolute 0.7  0.1 - 1.0 (K/uL)   Eosinophils Relative 2  0 - 5 (%)   Eosinophils Absolute 0.2  0.0 - 0.7 (K/uL)   Basophils Relative 0  0 - 1 (%)   Basophils Absolute 0.0  0.0 - 0.1 (K/uL)  BASIC METABOLIC PANEL      Component Value Range   Sodium 139  135 - 145 (mEq/L)   Potassium 3.5  3.5 - 5.1 (mEq/L)   Chloride 104  96 - 112 (mEq/L)   CO2 25  19 - 32 (mEq/L)   Glucose, Bld 112 (*) 70 - 99 (mg/dL)   BUN 16  6 - 23 (mg/dL)   Creatinine, Ser 1.61  0.50 - 1.35 (mg/dL)   Calcium 8.9  8.4 - 09.6 (mg/dL)   GFR calc non Af Amer >90  >90 (mL/min)   GFR calc Af Amer >90  >90 (mL/min)  URINALYSIS, ROUTINE W REFLEX MICROSCOPIC      Component Value Range   Color, Urine YELLOW  YELLOW    APPearance CLEAR  CLEAR    Specific Gravity, Urine 1.015  1.005 - 1.030    pH 6.0  5.0 - 8.0    Glucose, UA NEGATIVE  NEGATIVE (mg/dL)   Hgb urine dipstick TRACE (*) NEGATIVE    Bilirubin Urine NEGATIVE  NEGATIVE    Ketones, ur NEGATIVE  NEGATIVE (mg/dL)   Protein, ur NEGATIVE  NEGATIVE (mg/dL)   Urobilinogen, UA 1.0  0.0 - 1.0 (mg/dL)   Nitrite NEGATIVE  NEGATIVE    Leukocytes, UA NEGATIVE  NEGATIVE   URINE MICROSCOPIC-ADD ON      Component Value Range   Squamous Epithelial / LPF RARE  RARE    WBC, UA 0-2  <3 (WBC/hpf)   RBC / HPF 0-2  <3 (RBC/hpf)   Bacteria, UA RARE  RARE      MDM  The patient presented with a headache and elevated blood pressure. Headache had improved markedly prior to arrival having taken ibuprofen. There were no focal neurologic associated findings with the headache. Blood pressure has normalized. Patient was ambulated without difficulty. Labs were unremarkable.Pt feels improved after observation and/or treatment in ED.Pt stable in ED with no significant deterioration in condition.The patient appears reasonably screened and/or stabilized for discharge and I doubt any other medical  condition or other [facility] requiring further screening, evaluation, or treatment in the ED at this time prior to discharge.  I personally performed the services described in this documentation, which was scribed in my presence. The recorded information has been reviewed and considered.   MDM Reviewed: nursing note and vitals Interpretation: labs         Nicoletta Dress. Colon Branch, MD 12/10/11 0454

## 2011-12-09 NOTE — ED Notes (Signed)
Pt with headache starting at 1430

## 2011-12-10 LAB — DIFFERENTIAL
Basophils Absolute: 0 10*3/uL (ref 0.0–0.1)
Basophils Relative: 0 % (ref 0–1)
Eosinophils Relative: 2 % (ref 0–5)
Monocytes Absolute: 0.7 10*3/uL (ref 0.1–1.0)

## 2011-12-10 LAB — URINALYSIS, ROUTINE W REFLEX MICROSCOPIC
Glucose, UA: NEGATIVE mg/dL
Ketones, ur: NEGATIVE mg/dL
Leukocytes, UA: NEGATIVE
Nitrite: NEGATIVE
Protein, ur: NEGATIVE mg/dL

## 2011-12-10 LAB — URINE MICROSCOPIC-ADD ON

## 2011-12-10 LAB — CBC
HCT: 37.8 % — ABNORMAL LOW (ref 39.0–52.0)
MCHC: 33.3 g/dL (ref 30.0–36.0)
MCV: 80.9 fL (ref 78.0–100.0)
RDW: 14.2 % (ref 11.5–15.5)

## 2011-12-10 LAB — BASIC METABOLIC PANEL
Calcium: 8.9 mg/dL (ref 8.4–10.5)
Creatinine, Ser: 0.81 mg/dL (ref 0.50–1.35)
GFR calc Af Amer: >90 mL/min (ref >90)

## 2011-12-10 MED ORDER — HYDROCODONE-ACETAMINOPHEN 5-325 MG PO TABS
ORAL_TABLET | ORAL | Status: AC
  Filled 2011-12-10: qty 2

## 2011-12-10 MED ORDER — HYDROCODONE-ACETAMINOPHEN 5-325 MG PO TABS: 1.0000 | ORAL_TABLET | ORAL | Status: AC | PRN

## 2011-12-10 MED ORDER — HYDROCODONE-ACETAMINOPHEN 5-325 MG PO TABS
2.0000 | ORAL_TABLET | Freq: Once | ORAL | Status: AC
  Administered 2011-12-10: 2 via ORAL

## 2011-12-10 NOTE — Discharge Instructions (Signed)
Your bloodwork her today was normal. Blood pressure was in the normal range without any intervention. Continuing her current home medicines. Followup with your doctor.   General Headache, Without Cause A general headache has no specific cause. These headaches are not life-threatening. They will not lead to other types of headaches. HOME CARE   Make and keep follow-up visits with your doctor.   Only take medicine as told by your doctor.   Try to relax, get a massage, or use your thoughts to control your body (biofeedback).   Apply cold or heat to the head and neck. Apply 3 or 4 times a day or as needed.  Finding out the results of your test Ask when your test results will be ready. Make sure you get your test results. GET HELP RIGHT AWAY IF:   You have problems with medicine.   Your medicine does not help relieve pain.   Your headache changes or becomes worse.   You feel sick to your stomach (nauseous) or throw up (vomit).   You have a temperature by mouth above 102 F (38.9 C), not controlled by medicine.   Your have a stiff neck.   You have vision loss.   You have muscle weakness.   You lose control of your muscles.   You lose balance or have trouble walking.   You feel like you are going to pass out (faint).  MAKE SURE YOU:   Understand these instructions.   Will watch this condition.   Will get help right away if you are not doing well or get worse.  Document Released: 05/24/2008 Document Revised: 08/04/2011 Document Reviewed: 05/24/2008 [facility] Patient Information 2012 [place], [place].

## 2012-01-06 ENCOUNTER — Other Ambulatory Visit: Payer: Self-pay | Admitting: *Deleted

## 2012-01-06 MED ORDER — DILTIAZEM HCL ER COATED BEADS 360 MG PO CP24: 360.0000 mg | ORAL_CAPSULE | Freq: Every evening | ORAL | Status: DC

## 2012-01-06 MED ORDER — DIGOXIN 250 MCG PO TABS: 250.0000 ug | ORAL_TABLET | ORAL | Status: DC

## 2012-04-11 ENCOUNTER — Ambulatory Visit (HOSPITAL_COMMUNITY)
Admission: RE | Admit: 2012-04-11 | Discharge: 2012-04-11 | Disposition: A | Discharge status: Home / Self Care | Payer: 59 | Source: Ambulatory Visit | Attending: Family Medicine | Admitting: Family Medicine

## 2012-04-11 ENCOUNTER — Other Ambulatory Visit: Payer: Self-pay | Admitting: Family Medicine

## 2012-04-11 DIAGNOSIS — R059 Cough, unspecified: Secondary | ICD-10-CM | POA: Insufficient documentation

## 2012-04-11 DIAGNOSIS — R05 Cough: Secondary | ICD-10-CM | POA: Insufficient documentation

## 2012-08-20 ENCOUNTER — Other Ambulatory Visit: Payer: Self-pay | Admitting: Cardiology

## 2012-08-20 NOTE — Telephone Encounter (Signed)
rx sent to pharmacy by e-script Per noted recall in pt chart to set up follow up apt for 08-2012

## 2012-09-17 ENCOUNTER — Other Ambulatory Visit: Payer: Self-pay | Admitting: Cardiology

## 2012-09-17 NOTE — Telephone Encounter (Signed)
rx sent to pharmacy by e-script To last pt till f/u noted in chart as one year

## 2012-10-02 ENCOUNTER — Encounter: Payer: Self-pay | Admitting: Adult Health

## 2012-10-02 ENCOUNTER — Ambulatory Visit (INDEPENDENT_AMBULATORY_CARE_PROVIDER_SITE_OTHER): Payer: 59 | Admitting: Adult Health

## 2012-10-02 ENCOUNTER — Encounter: Payer: Self-pay | Admitting: *Deleted

## 2012-10-02 VITALS — BP 160/90 | HR 84 | Wt 223.0 lb

## 2012-10-02 DIAGNOSIS — Z87891 Personal history of nicotine dependence: Secondary | ICD-10-CM

## 2012-10-02 DIAGNOSIS — I2510 Atherosclerotic heart disease of native coronary artery without angina pectoris: Secondary | ICD-10-CM

## 2012-10-02 DIAGNOSIS — E785 Hyperlipidemia, unspecified: Secondary | ICD-10-CM

## 2012-10-02 DIAGNOSIS — F17201 Nicotine dependence, unspecified, in remission: Secondary | ICD-10-CM

## 2012-10-02 DIAGNOSIS — I7090 Unspecified atherosclerosis: Secondary | ICD-10-CM

## 2012-10-02 DIAGNOSIS — I10 Essential (primary) hypertension: Secondary | ICD-10-CM

## 2012-10-02 DIAGNOSIS — I4891 Unspecified atrial fibrillation: Secondary | ICD-10-CM

## 2012-10-02 MED ORDER — VARENICLINE TARTRATE 0.5 MG PO TABS: 0.5000 mg | ORAL_TABLET | Freq: Two times a day (BID) | ORAL | Status: DC

## 2012-10-02 MED ORDER — DILTIAZEM HCL ER BEADS 360 MG PO CP24: 360.0000 mg | ORAL_CAPSULE | Freq: Every day | ORAL | Status: DC

## 2012-10-02 MED ORDER — VARENICLINE TARTRATE 1 MG PO TABS: 1.0000 mg | ORAL_TABLET | Freq: Two times a day (BID) | ORAL | Status: DC

## 2012-10-02 NOTE — Assessment & Plan Note (Signed)
>>  ASSESSMENT AND PLAN FOR TOBACCO USE DISORDER WRITTEN ON 10/02/2012  4:23 PM BY Jodelle Gross, NP  He unfortunately continues to smoke. He is requesting help in cessation. We have discussed at length the different strategies, and health benefits to quitting. He will be placed on Chantix graduated dosing for 3 months. He has no history of seizures. I have spoken to him about the side effects of nightmares associated with use. He verbalizes understanding. He is to quit smoking 10 days after starting the medication. We also spoke about electronic cigarettes to help him with the habit of hand mouth addition. He may try this as well.

## 2012-10-02 NOTE — Assessment & Plan Note (Signed)
Elevated initially. I rechecked it in the exam room manually. BP 138/84. Will make no changes on current tx regimen.

## 2012-10-02 NOTE — Assessment & Plan Note (Signed)
Currently in NSR with PAC's. He is not on anticoagulation at this time, but takes ASA daily. Will refill Taztia 360 mg daily. He is advised to cut down on caffeine intake to avoid frequent palpitations. He verbalizes understanding.

## 2012-10-02 NOTE — Progress Notes (Deleted)
Name: Shaun [name]    DOB: [birth year]  Age: 57 y.o.  MR#: [id number]       PCP:  Harlow Asa, MD      Insurance: @PAYORNAME @   CC:   No chief complaint on file.   VS BP 160/90  Pulse 84  Wt 223 lb (101.152 kg)  Weights Current Weight  10/02/12 223 lb (101.152 kg)  12/09/11 228 lb (103.42 kg)  09/07/11 224 lb (101.606 kg)    Blood Pressure  BP Readings from Last 3 Encounters:  10/02/12 160/90  12/10/11 138/75  09/07/11 139/72     Admit date:  (Not on file) Last encounter with RMR:  Visit date not found   Allergy No Known Allergies  Current Outpatient Prescriptions  Medication Sig Dispense Refill  . albuterol-ipratropium (COMBIVENT) 18-103 MCG/ACT inhaler Inhale 2 puffs into the lungs every 6 (six) hours as needed.        Marland Kitchen aspirin 81 MG tablet Take 81 mg by mouth every morning.       . Chlorphen-PE-Acetaminophen (CORICIDIN D COLD/FLU/SINUS PO) Take 1 tablet by mouth as needed.        Marland Kitchen DIGOX 0.25 MG tablet TAKE 1 TABLET BY MOUTH EVERY MORNING  30 tablet  11  . ibuprofen (ADVIL,MOTRIN) 200 MG tablet Take 200 mg by mouth every 6 (six) hours as needed.       . metFORMIN (GLUCOPHAGE) 500 MG tablet Take 500 mg by mouth 2 (two) times daily.       . Multiple Vitamins-Minerals (MULTIVITAMIN WITH MINERALS) tablet Take 1 tablet by mouth daily.        . potassium chloride SA (K-DUR,KLOR-CON) 20 MEQ tablet Take 20 mEq by mouth as needed.      . pravastatin (PRAVACHOL) 20 MG tablet TAKE 1 TABLET BY MOUTH ONCE DAILY  30 tablet  11  . rOPINIRole (REQUIP) 2 MG tablet       . sildenafil (VIAGRA) 50 MG tablet Take 1/2 to 2 tablets as needed  10 tablet  6  . TAZTIA XT 360 MG 24 hr capsule TAKE 1 CAPSULE BY MOUTH EVERY EVENING.  30 capsule  1  . [DISCONTINUED] diltiazem (CARDIZEM CD) 360 MG 24 hr capsule Take 1 capsule (360 mg total) by mouth every evening.  30 capsule  6    Discontinued Meds:    Medications Discontinued During This Encounter  Medication Reason  . doxycycline  (VIBRA-TABS) 100 MG tablet Completed Course    Patient Active Problem List  Diagnosis  . HYPERLIPIDEMIA  . Atrial fibrillation  . Arteriosclerotic cardiovascular disease (ASCVD)  . Tobacco abuse, in remission  . Obstructive sleep apnea  . Hypertension  . Diabetes mellitus    LABS No visits with results within 3 Month(s) from this visit. Latest known visit with results is:  Admission on 12/09/2011, Discharged on 12/10/2011  Component Date Value  . WBC 12/09/2011 9.8   . RBC 12/09/2011 4.67   . Hemoglobin 12/09/2011 12.6*  . HCT 12/09/2011 37.8*  . MCV 12/09/2011 80.9   . [place] 12/09/2011 27.0   . MCHC 12/09/2011 33.3   . RDW 12/09/2011 14.2   . Platelets 12/09/2011 218   . Neutrophils Relative 12/09/2011 62   . Neutro Abs 12/09/2011 6.1   . Lymphocytes Relative 12/09/2011 29   . Lymphs Abs 12/09/2011 2.9   . Monocytes Relative 12/09/2011 7   . Monocytes Absolute 12/09/2011 0.7   . Eosinophils Relative 12/09/2011 2   . Eosinophils Absolute 12/09/2011  0.2   . Basophils Relative 12/09/2011 0   . Basophils Absolute 12/09/2011 0.0   . Sodium 12/09/2011 139   . Potassium 12/09/2011 3.5   . Chloride 12/09/2011 104   . CO2 12/09/2011 25   . Glucose, Bld 12/09/2011 112*  . BUN 12/09/2011 16   . Creatinine, Ser 12/09/2011 0.81   . Calcium 12/09/2011 8.9   . GFR calc non Af Amer 12/09/2011 >90   . GFR calc Af Amer 12/09/2011 >90   . Color, Urine 12/10/2011 YELLOW   . APPearance 12/10/2011 CLEAR   . Specific Gravity, Urine 12/10/2011 1.015   . pH 12/10/2011 6.0   . Glucose, UA 12/10/2011 NEGATIVE   . Hgb urine dipstick 12/10/2011 TRACE*  . Bilirubin Urine 12/10/2011 NEGATIVE   . Ketones, ur 12/10/2011 NEGATIVE   . Protein, ur 12/10/2011 NEGATIVE   . Urobilinogen, UA 12/10/2011 1.0   . Nitrite 12/10/2011 NEGATIVE   . Leukocytes, UA 12/10/2011 NEGATIVE   . Squamous Epithelial / LPF 12/10/2011 RARE   . WBC, UA 12/10/2011 0-2   . RBC / HPF 12/10/2011 0-2   . Bacteria, UA  12/10/2011 RARE      Results for this Opt Visit:     Results for orders placed during the hospital encounter of 12/09/11  CBC      Component Value Range   WBC 9.8  4.0 - 10.5 K/uL   RBC 4.67  4.22 - 5.81 MIL/uL   Hemoglobin 12.6 (*) 13.0 - 17.0 g/dL   HCT 16.1 (*) 09.6 - 04.5 %   MCV 80.9  78.0 - 100.0 fL   MCH 27.0  26.0 - 34.0 pg   MCHC 33.3  30.0 - 36.0 g/dL   RDW 40.9  81.1 - 91.4 %   Platelets 218  150 - 400 K/uL  DIFFERENTIAL      Component Value Range   Neutrophils Relative 62  43 - 77 %   Neutro Abs 6.1  1.7 - 7.7 K/uL   Lymphocytes Relative 29  12 - 46 %   Lymphs Abs 2.9  0.7 - 4.0 K/uL   Monocytes Relative 7  3 - 12 %   Monocytes Absolute 0.7  0.1 - 1.0 K/uL   Eosinophils Relative 2  0 - 5 %   Eosinophils Absolute 0.2  0.0 - 0.7 K/uL   Basophils Relative 0  0 - 1 %   Basophils Absolute 0.0  0.0 - 0.1 K/uL  BASIC METABOLIC PANEL      Component Value Range   Sodium 139  135 - 145 mEq/L   Potassium 3.5  3.5 - 5.1 mEq/L   Chloride 104  96 - 112 mEq/L   CO2 25  19 - 32 mEq/L   Glucose, Bld 112 (*) 70 - 99 mg/dL   BUN 16  6 - 23 mg/dL   Creatinine, Ser 7.82  0.50 - 1.35 mg/dL   Calcium 8.9  8.4 - 95.6 mg/dL   GFR calc non Af Amer >90  >90 mL/min   GFR calc Af Amer >90  >90 mL/min  URINALYSIS, ROUTINE W REFLEX MICROSCOPIC      Component Value Range   Color, Urine YELLOW  YELLOW   APPearance CLEAR  CLEAR   Specific Gravity, Urine 1.015  1.005 - 1.030   pH 6.0  5.0 - 8.0   Glucose, UA NEGATIVE  NEGATIVE mg/dL   Hgb urine dipstick TRACE (*) NEGATIVE   Bilirubin Urine NEGATIVE  NEGATIVE   Ketones,  ur NEGATIVE  NEGATIVE mg/dL   Protein, ur NEGATIVE  NEGATIVE mg/dL   Urobilinogen, UA 1.0  0.0 - 1.0 mg/dL   Nitrite NEGATIVE  NEGATIVE   Leukocytes, UA NEGATIVE  NEGATIVE  URINE MICROSCOPIC-ADD ON      Component Value Range   Squamous Epithelial / LPF RARE  RARE   WBC, UA 0-2  <3 WBC/hpf   RBC / HPF 0-2  <3 RBC/hpf   Bacteria, UA RARE  RARE    EKG Orders  placed during the hospital encounter of 02/03/11  . EKG  . EKG     Prior Assessment and Plan Problem List as of 10/02/2012            Cardiology Problems   HYPERLIPIDEMIA   Last Assessment & Plan Note   09/07/2011 Office Visit Addendum 09/07/2011 11:15 AM by Kathlen Brunswick, MD    Despite diabetes and nonobstructive coronary disease, patient is receiving no lipid lower agent at present.  High triglycerides and low HDL level likely have improved since lipids were last assessed 7 months ago as the result of better control of diabetes.  He also apparently has a myopathy with muscle pain than minimally elevated CPK.  Nonetheless, treatment with statin would afford him benefits.  We will start with a small dose of pravastatin, 20 mg per day and follow lipids and CPK.    Atrial fibrillation   Last Assessment & Plan Note   09/07/2011 Office Visit Signed 09/07/2011 11:08 AM by Kathlen Brunswick, MD    Palpitations may or may not represent recurrent arrhythmia.  With brief episodes and symptoms limited to palpitations, no further testing or treatment is required.    Arteriosclerotic cardiovascular disease (ASCVD)   Last Assessment & Plan Note   09/07/2011 Office Visit Signed 09/07/2011 11:08 AM by Kathlen Brunswick, MD    Patient continues to have no symptoms referable to coronary artery disease and may well still have nonobstructive disease.  No further diagnostic testing is required, and optimal management of cardiovascular risk factors will continue to be pursued    Hypertension   Last Assessment & Plan Note   09/07/2011 Office Visit Addendum 09/07/2011 11:13 AM by Kathlen Brunswick, MD    Home systolics reported as 130 or less. Blood pressure control is excellent; current medications will be continued.      Other   Tobacco abuse, in remission   Last Assessment & Plan Note   02/09/2011 Office Visit Signed 02/09/2011 12:41 PM by Kathlen Brunswick, MD    Patient is congratulated on abstaining from cigarette  smoking for the past 8 months and encouraged to continue.  Unfortunately, chronic dyspnea has not improved and he now appears to have bronchitis.  A 5 day course of doxycycline was prescribed as well as an antitussive preparation.    Obstructive sleep apnea   Diabetes mellitus   Last Assessment & Plan Note   09/07/2011 Office Visit Signed 09/07/2011 11:09 AM by Kathlen Brunswick, MD    Patient reports excellent control with CBGs consistently less than 130 and a recent A1c level of 6.6.        Imaging: No results found.   FRS Calculation: Score not calculated. Missing: Total Cholesterol

## 2012-10-02 NOTE — Assessment & Plan Note (Signed)
He offers no complaints of recurrent chest pain. Will concentrate on risk factor management. Most importantly smoking cessation.

## 2012-10-02 NOTE — Assessment & Plan Note (Signed)
Labs are followed by Dr. Gerda Diss. He is advised to increase exercise and activity to lose weight to increase HDL level and decrease LDL.

## 2012-10-02 NOTE — Assessment & Plan Note (Signed)
He unfortunately continues to smoke. He is requesting help in cessation. We have discussed at length the different strategies, and health benefits to quitting. He will be placed on Chantix graduated dosing for 3 months. He has no history of seizures. I have spoken to him about the side effects of nightmares associated with use. He verbalizes understanding. He is to quit smoking 10 days after starting the medication. We also spoke about electronic cigarettes to help him with the habit of hand mouth addition. He may try this as well.

## 2012-10-02 NOTE — Progress Notes (Signed)
HPI: [name] is a 57 y/o patient of Dr. Dietrich Pates we are following for non-obstructive CAD, PAF, and hypertension. He unfortunately continues to smoke, with 35 pk/yr history. He comes today without cardiac complaint with the exception occasional frequent palpitations. He states he drinks a lot of caffeine, and is under a lot of stress at work. He wants to quit smoking and is requesting help with this. He denies chest pain, opr significant DOE or weakness.   No Known Allergies  Current Outpatient Prescriptions  Medication Sig Dispense Refill  . albuterol-ipratropium (COMBIVENT) 18-103 MCG/ACT inhaler Inhale 2 puffs into the lungs every 6 (six) hours as needed.        Marland Kitchen aspirin 81 MG tablet Take 81 mg by mouth every morning.       . Chlorphen-PE-Acetaminophen (CORICIDIN D COLD/FLU/SINUS PO) Take 1 tablet by mouth as needed.        Marland Kitchen DIGOX 0.25 MG tablet TAKE 1 TABLET BY MOUTH EVERY MORNING  30 tablet  11  . diltiazem (TAZTIA XT) 360 MG 24 hr capsule Take 1 capsule (360 mg total) by mouth daily.  30 capsule  6  . ibuprofen (ADVIL,MOTRIN) 200 MG tablet Take 200 mg by mouth every 6 (six) hours as needed.       . metFORMIN (GLUCOPHAGE) 500 MG tablet Take 500 mg by mouth 2 (two) times daily.       . Multiple Vitamins-Minerals (MULTIVITAMIN WITH MINERALS) tablet Take 1 tablet by mouth daily.        . potassium chloride SA (K-DUR,KLOR-CON) 20 MEQ tablet Take 20 mEq by mouth as needed.      . pravastatin (PRAVACHOL) 20 MG tablet TAKE 1 TABLET BY MOUTH ONCE DAILY  30 tablet  11  . rOPINIRole (REQUIP) 2 MG tablet       . sildenafil (VIAGRA) 50 MG tablet Take 1/2 to 2 tablets as needed  10 tablet  6  . varenicline (CHANTIX) 0.5 MG tablet Take 1 tablet (0.5 mg total) by mouth 2 (two) times daily.  60 tablet  0  . varenicline (CHANTIX) 1 MG tablet Take 1 tablet (1 mg total) by mouth 2 (two) times daily.  60 tablet  1  . [DISCONTINUED] diltiazem (CARDIZEM CD) 360 MG 24 hr capsule Take 1 capsule (360 mg  total) by mouth every evening.  30 capsule  6    Past Medical History  Diagnosis Date  . Paroxysmal atrial fibrillation 10/2004    onset in 10/2004; recurred 09/2008  . Arteriosclerotic cardiovascular disease (ASCVD)     Nonobstructive; cath in 2000: 30-40% mid LAD and proximal RCA;normal EF. stress nuclear in 2006-subtle inferoseptal hypoperfusion with reversibility; negative stress EKG; good exercise tolerance  . Hypertension   . Tobacco abuse     40 pack years  . Sinusitis   . Insomnia   . Obstructive sleep apnea 12/2009    01/26/2010 AHI 83/hr    Past Surgical History  Procedure Date  . Hernia repair 1986    Left inguinal  . Rotator cuff repair     Right    ROS: Review of systems complete and found to be negative unless listed above  PHYSICAL EXAM BP 160/90  Pulse 84  Wt 223 lb (101.152 kg)  General: Well developed, well nourished, in no acute distress Head: Eyes PERRLA, No xanthomas.   Normal cephalic and atramatic  Lungs: Clear bilaterally to auscultation and percussion. Heart: HRRR S1 S2, bradycardic with occasional irregular heart rhythm without MRG.  Pulses are 2+ & equal.            No carotid bruit. No JVD.  No abdominal bruits. No femoral bruits. Abdomen: Bowel sounds are positive, abdomen soft and non-tender without masses or                  Hernia's noted. Msk:  Back normal, normal gait. Normal strength and tone for age. Extremities: No clubbing, cyanosis or edema.  DP +1 Neuro: Alert and oriented X 3. Psych:  Good affect, responds appropriately  EKG: SR with frequent PAC's, with nonspecific T-Wave abnormalities in the lateral leads. Rate of 84 bpm.  ASSESSMENT AND PLAN

## 2012-10-03 NOTE — Addendum Note (Signed)
Addended by: Derry Lory A on: 10/03/2012 01:12 PM   Modules accepted: Orders

## 2012-12-11 ENCOUNTER — Other Ambulatory Visit: Payer: Self-pay | Admitting: *Deleted

## 2012-12-11 MED ORDER — METFORMIN HCL 500 MG PO TABS: 500.0000 mg | ORAL_TABLET | Freq: Two times a day (BID) | ORAL | Status: DC

## 2012-12-19 ENCOUNTER — Emergency Department (HOSPITAL_COMMUNITY): Payer: 59

## 2012-12-19 ENCOUNTER — Encounter (HOSPITAL_COMMUNITY): Payer: Self-pay

## 2012-12-19 ENCOUNTER — Emergency Department (HOSPITAL_COMMUNITY)
Admission: EM | Admit: 2012-12-19 | Discharge: 2012-12-20 | Disposition: A | Discharge status: Home / Self Care | Payer: 59 | Attending: Emergency Medicine | Admitting: Emergency Medicine

## 2012-12-19 DIAGNOSIS — Z7982 Long term (current) use of aspirin: Secondary | ICD-10-CM | POA: Insufficient documentation

## 2012-12-19 DIAGNOSIS — Z8679 Personal history of other diseases of the circulatory system: Secondary | ICD-10-CM | POA: Insufficient documentation

## 2012-12-19 DIAGNOSIS — R0602 Shortness of breath: Secondary | ICD-10-CM | POA: Insufficient documentation

## 2012-12-19 DIAGNOSIS — G4733 Obstructive sleep apnea (adult) (pediatric): Secondary | ICD-10-CM | POA: Insufficient documentation

## 2012-12-19 DIAGNOSIS — R079 Chest pain, unspecified: Secondary | ICD-10-CM

## 2012-12-19 DIAGNOSIS — I4891 Unspecified atrial fibrillation: Secondary | ICD-10-CM | POA: Insufficient documentation

## 2012-12-19 DIAGNOSIS — E119 Type 2 diabetes mellitus without complications: Secondary | ICD-10-CM | POA: Insufficient documentation

## 2012-12-19 DIAGNOSIS — Z79899 Other long term (current) drug therapy: Secondary | ICD-10-CM | POA: Insufficient documentation

## 2012-12-19 DIAGNOSIS — F17200 Nicotine dependence, unspecified, uncomplicated: Secondary | ICD-10-CM | POA: Insufficient documentation

## 2012-12-19 DIAGNOSIS — I10 Essential (primary) hypertension: Secondary | ICD-10-CM | POA: Insufficient documentation

## 2012-12-19 HISTORY — DX: Type 2 diabetes mellitus without complications: E11.9

## 2012-12-19 LAB — COMPREHENSIVE METABOLIC PANEL
ALT: 30 U/L (ref 0–53)
AST: 19 U/L (ref 0–37)
Calcium: 9.5 mg/dL (ref 8.4–10.5)
Creatinine, Ser: 0.78 mg/dL (ref 0.50–1.35)
GFR calc Af Amer: >90 mL/min (ref >90)
Sodium: 138 mEq/L (ref 135–145)
Total Protein: 6.8 g/dL (ref 6.0–8.3)

## 2012-12-19 LAB — CBC WITH DIFFERENTIAL/PLATELET
Basophils Absolute: 0 10*3/uL (ref 0.0–0.1)
Eosinophils Absolute: 0.2 10*3/uL (ref 0.0–0.7)
Eosinophils Relative: 2 % (ref 0–5)
HCT: 40.5 % (ref 39.0–52.0)
MCH: 27.7 pg (ref 26.0–34.0)
MCHC: 34.6 g/dL (ref 30.0–36.0)
MCV: 80 fL (ref 78.0–100.0)
Monocytes Absolute: 0.7 10*3/uL (ref 0.1–1.0)
Platelets: 201 10*3/uL (ref 150–400)
RDW: 14.1 % (ref 11.5–15.5)
WBC: 11.2 10*3/uL — ABNORMAL HIGH (ref 4.0–10.5)

## 2012-12-19 MED ORDER — MORPHINE SULFATE 4 MG/ML IJ SOLN: 2.0000 mg | Freq: Once | INTRAMUSCULAR | Status: DC

## 2012-12-19 MED ORDER — ONDANSETRON HCL 4 MG/2ML IJ SOLN: 4.0000 mg | Freq: Once | INTRAMUSCULAR | Status: DC

## 2012-12-19 NOTE — ED Notes (Signed)
Complaining of chest pain, history of atrial fib, also having shortness of breath per pt. Chest pain started last night.

## 2012-12-19 NOTE — ED Provider Notes (Addendum)
History  This chart was scribed for [employer]. Colon Branch, MD by Shari Heritage and Lacey Jensen, ED Scribes. The patient was seen in room APA05/APA05. Patient's care was started at 2258.   CSN: [id number]  Arrival date & time 12/19/12  2204   First MD Initiated Contact with Patient 12/19/12 2258      Chief Complaint  Patient presents with  . Chest Pain  . Shortness of Breath  . Atrial Fibrillation     The history is provided by the patient. No language interpreter was used.     HPI Comments: Shaun [name] is a 57 y.o. male with history of atrial fibrillation who presents to the Emergency Department complaining of sharp and pressure like, persistent left chest pain onset yesterday night. His pain level is a 2-3/10 currently. There is associated shortness of breath. Patient states that there are no aggravating or relieving factors for symptoms. Pain is worse with deep breathing that causes tightness in the chest. Patient denies cough, fever or chills. He is not on any anticoagulants to treat atrial fibrillation. Other medical history includes hypertension, ASCVD, diabetes and sleep apnea. He is a current smoker. He does not use alcohol.  PCP Dr. Gerda Diss  Past Medical History  Diagnosis Date  . Paroxysmal atrial fibrillation 10/2004    onset in 10/2004; recurred 09/2008  . Arteriosclerotic cardiovascular disease (ASCVD)     Nonobstructive; cath in 2000: 30-40% mid LAD and proximal RCA;normal EF. stress nuclear in 2006-subtle inferoseptal hypoperfusion with reversibility; negative stress EKG; good exercise tolerance  . Hypertension   . Tobacco abuse     40 pack years  . Sinusitis   . Insomnia   . Obstructive sleep apnea 12/2009    01/26/2010 AHI 83/hr  . Diabetes mellitus without complication     Past Surgical History  Procedure Laterality Date  . Hernia repair  1986    Left inguinal  . Rotator cuff repair      Right    Family History  Problem Relation Age of Onset  .  Hypertension Mother   . Heart attack Father   . Diabetes Brother     History  Substance Use Topics  . Smoking status: Current Every Day Smoker -- 1.00 packs/day for 32 years    Types: Cigarettes    Last Attempt to Quit: 07/04/2010  . Smokeless tobacco: Never Used  . Alcohol Use: No      Review of Systems  Constitutional: Negative for fever.       10 Systems reviewed and are negative for acute change except as noted in the HPI.  HENT: Negative for congestion.   Eyes: Negative for discharge and redness.  Respiratory: Negative for cough and shortness of breath.   Cardiovascular: Positive for chest pain.  Gastrointestinal: Negative for vomiting and abdominal pain.  Musculoskeletal: Negative for back pain.  Skin: Negative for rash.  Neurological: Negative for syncope, numbness and headaches.  Psychiatric/Behavioral:       No behavior change.  A complete 10 system review of systems was obtained and all systems are negative except as noted in the HPI and PMH.    Allergies  Review of patient's allergies indicates no known allergies.  Home Medications   Current Outpatient Rx  Name  Route  Sig  Dispense  Refill  . albuterol-ipratropium (COMBIVENT) 18-103 MCG/ACT inhaler   Inhalation   Inhale 2 puffs into the lungs every 6 (six) hours as needed.           Marland Kitchen  aspirin 81 MG tablet   Oral   Take 81 mg by mouth every morning.          . chlorthalidone (HYGROTON) 25 MG tablet   Oral   Take 25 mg by mouth as needed (fluid retention).         Marland Kitchen DIGOX 0.25 MG tablet      TAKE 1 TABLET BY MOUTH EVERY MORNING   30 tablet   11   . diltiazem (TAZTIA XT) 360 MG 24 hr capsule   Oral   Take 1 capsule (360 mg total) by mouth daily.   30 capsule   6   . ibuprofen (ADVIL,MOTRIN) 200 MG tablet   Oral   Take 200 mg by mouth every 6 (six) hours as needed.          . loratadine (CLARITIN) 10 MG tablet   Oral   Take 10 mg by mouth daily.         . metFORMIN (GLUCOPHAGE)  500 MG tablet   Oral   Take 1 tablet (500 mg total) by mouth 2 (two) times daily.   60 tablet   5   . Multiple Vitamins-Minerals (MULTIVITAMIN WITH MINERALS) tablet   Oral   Take 1 tablet by mouth daily.           . potassium chloride SA (K-DUR,KLOR-CON) 20 MEQ tablet   Oral   Take 20 mEq by mouth as needed.         . pravastatin (PRAVACHOL) 20 MG tablet      TAKE 1 TABLET BY MOUTH ONCE DAILY   30 tablet   11   . ranitidine (ZANTAC) 150 MG tablet   Oral   Take 150 mg by mouth daily as needed for heartburn.         Marland Kitchen rOPINIRole (REQUIP) 2 MG tablet   Oral   Take 2 mg by mouth at bedtime.          . sildenafil (VIAGRA) 50 MG tablet      Take 1/2 to 2 tablets as needed   10 tablet   6     Triage Vitals: BP 154/88  Pulse 76  Temp(Src) 97 F (36.1 C) (Oral)  Resp 16  Ht 5\' 8"  (1.727 m)  Wt 224 lb (101.606 kg)  BMI 34.07 kg/m2  SpO2 96%  Physical Exam  Constitutional: He appears well-developed and well-nourished.  HENT:  Head: Normocephalic and atraumatic.  Eyes: Conjunctivae and EOM are normal. Pupils are equal, round, and reactive to light.  Neck: Normal range of motion. Neck supple.  Cardiovascular: Normal rate, regular rhythm and normal heart sounds.   Pulmonary/Chest: Effort normal and breath sounds normal.  Abdominal: Soft. Bowel sounds are normal. There is no tenderness.  Musculoskeletal: Normal range of motion.  Neurological: He is alert.  Skin: Skin is warm and dry.    ED Course  Procedures (including critical care time) Results for orders placed during the hospital encounter of 12/19/12  CBC WITH DIFFERENTIAL      Result Value Range   WBC 11.2 (*) 4.0 - 10.5 K/uL   RBC 5.06  4.22 - 5.81 MIL/uL   Hemoglobin 14.0  13.0 - 17.0 g/dL   HCT 16.1  09.6 - 04.5 %   MCV 80.0  78.0 - 100.0 fL   MCH 27.7  26.0 - 34.0 pg   MCHC 34.6  30.0 - 36.0 g/dL   RDW 40.9  81.1 - 91.4 %   Platelets  201  150 - 400 K/uL   Neutrophils Relative 63  43 - 77 %    Neutro Abs 7.1  1.7 - 7.7 K/uL   Lymphocytes Relative 29  12 - 46 %   Lymphs Abs 3.3  0.7 - 4.0 K/uL   Monocytes Relative 6  3 - 12 %   Monocytes Absolute 0.7  0.1 - 1.0 K/uL   Eosinophils Relative 2  0 - 5 %   Eosinophils Absolute 0.2  0.0 - 0.7 K/uL   Basophils Relative 0  0 - 1 %   Basophils Absolute 0.0  0.0 - 0.1 K/uL  COMPREHENSIVE METABOLIC PANEL      Result Value Range   Sodium 138  135 - 145 mEq/L   Potassium 3.4 (*) 3.5 - 5.1 mEq/L   Chloride 102  96 - 112 mEq/L   CO2 24  19 - 32 mEq/L   Glucose, Bld 149 (*) 70 - 99 mg/dL   BUN 25 (*) 6 - 23 mg/dL   Creatinine, Ser 1.61  0.50 - 1.35 mg/dL   Calcium 9.5  8.4 - 09.6 mg/dL   Total Protein 6.8  6.0 - 8.3 g/dL   Albumin 3.7  3.5 - 5.2 g/dL   AST 19  0 - 37 U/L   ALT 30  0 - 53 U/L   Alkaline Phosphatase 71  39 - 117 U/L   Total Bilirubin 0.2 (*) 0.3 - 1.2 mg/dL   GFR calc non Af Amer >90  >90 mL/min   GFR calc Af Amer >90  >90 mL/min  TROPONIN I      Result Value Range   Troponin I <0.30  <0.30 ng/mL   DIAGNOSTIC STUDIES: Oxygen Saturation is 96% on room air, adequate by my interpretation.    COORDINATION OF CARE: 11:20 PM- Patient informed of current plan for treatment and evaluation and agrees with plan at this time.    Date: 12/20/2012   2215  Rate:109  Rhythm: atrial fibrillation  QRS Axis: normal  Intervals: atrial fib  ST/T Wave abnormalities: nonspecific ST changes  Conduction Disutrbances:none  Narrative Interpretation:   Old EKG Reviewed: changes noted c/w 02/03/11 now in atrial fib Dg Chest Portable 1 View  12/19/2012  *RADIOLOGY REPORT*  Clinical Data: Chest pain shortness of breath.  History of atrial fibrillation.  Diabetes.  Smoker.  PORTABLE CHEST - 1 VIEW  Comparison: 02/03/2011  Findings: Moderate hemidiaphragm elevation. Midline trachea.  Mild cardiomegaly.  The chin overlies the apices minimally. No pleural effusion or pneumothorax. No congestive failure.  Clear lungs.  IMPRESSION: Cardiomegaly  without congestive failure.   Original Report Authenticated By: Jeronimo Greaves, M.D.    Medications  morphine 4 MG/ML injection 2 mg (2 mg Intravenous Not Given 12/20/12 0004)  ondansetron (ZOFRAN) injection 4 mg (4 mg Intravenous Not Given 12/20/12 0004)    MDM  Patient with left sided chest pain since yesterday night (27 hours). Pain has been intermittent. Labs unremarkable. Troponin negative. EKG normal. Chest xray normal. Patient has had atrial fibrillation for three years and is not on a blood thinner. Reviewed results with patient and his wife.  Pt feels improved after observation and/or treatment in ED.Pt stable in ED with no significant deterioration in condition.The patient appears reasonably screened and/or stabilized for discharge and I doubt any other medical condition or other [facility] requiring further screening, evaluation, or treatment in the ED at this time prior to discharge.  I personally performed the services described in this  documentation, which was scribed in my presence. The recorded information has been reviewed and considered.   MDM Reviewed: nursing note and vitals Interpretation: labs, ECG and x-ray           Nicoletta Dress. Colon Branch, MD 12/20/12 Rich Fuchs  Nicoletta Dress. Colon Branch, MD 12/20/12 0024  Nicoletta Dress. Colon Branch, MD 12/20/12 [phone]

## 2013-03-08 ENCOUNTER — Encounter: Payer: Self-pay | Admitting: *Deleted

## 2013-03-13 ENCOUNTER — Ambulatory Visit (INDEPENDENT_AMBULATORY_CARE_PROVIDER_SITE_OTHER): Payer: 59 | Admitting: Family Medicine

## 2013-03-13 ENCOUNTER — Encounter: Payer: Self-pay | Admitting: Family Medicine

## 2013-03-13 VITALS — BP 120/78 | Wt 227.6 lb

## 2013-03-13 DIAGNOSIS — Z125 Encounter for screening for malignant neoplasm of prostate: Secondary | ICD-10-CM

## 2013-03-13 DIAGNOSIS — I4891 Unspecified atrial fibrillation: Secondary | ICD-10-CM

## 2013-03-13 DIAGNOSIS — E119 Type 2 diabetes mellitus without complications: Secondary | ICD-10-CM

## 2013-03-13 DIAGNOSIS — Z79899 Other long term (current) drug therapy: Secondary | ICD-10-CM

## 2013-03-13 DIAGNOSIS — G4733 Obstructive sleep apnea (adult) (pediatric): Secondary | ICD-10-CM

## 2013-03-13 DIAGNOSIS — I10 Essential (primary) hypertension: Secondary | ICD-10-CM

## 2013-03-13 DIAGNOSIS — E785 Hyperlipidemia, unspecified: Secondary | ICD-10-CM

## 2013-03-13 DIAGNOSIS — E782 Mixed hyperlipidemia: Secondary | ICD-10-CM

## 2013-03-13 LAB — POCT GLYCOSYLATED HEMOGLOBIN (HGB A1C): Hemoglobin A1C: 7.3

## 2013-03-13 MED ORDER — METFORMIN HCL 500 MG PO TABS: 1000.0000 mg | ORAL_TABLET | Freq: Two times a day (BID) | ORAL | Status: DC

## 2013-03-13 NOTE — Progress Notes (Signed)
  Subjective:    Patient ID: Shaun [name], male    DOB: October 13, 1955, 57 y.o.   MRN: [id number] Restless legs overall better.  Diabetes He has type 2 diabetes mellitus. His disease course has been worsening. Hypoglycemia symptoms include nervousness/anxiousness. Associated symptoms include fatigue and polydipsia. Pertinent negatives for diabetes include no blurred vision and no foot ulcerations. Pertinent negatives for hypoglycemia complications include no blackouts. Symptoms are worsening. Risk factors for coronary artery disease include diabetes mellitus, hypertension, dyslipidemia and sedentary lifestyle. Current diabetic treatment includes oral agent (monotherapy) and diet. His weight is increasing steadily. He is following a generally unhealthy and low fat/cholesterol diet. Meal planning includes avoidance of concentrated sweets. He has not had a previous visit with a dietician. He participates in exercise three times a week. His home blood glucose trend is increasing steadily. His breakfast blood glucose range is generally 130-140 mg/dl. An ACE inhibitor/angiotensin II receptor blocker is being taken. Eye exam is not current.   Results for orders placed in visit on 03/13/13  POCT GLYCOSYLATED HEMOGLOBIN (HGB A1C)      Result Value Range   Hemoglobin A1C 7.3      patient claims compliance with all his medications.  Dr Dietrich Pates seeing for a fib Review of Systems  Constitutional: Positive for fatigue.  Eyes: Negative for blurred vision.  Endocrine: Positive for polydipsia.  Psychiatric/Behavioral: The patient is nervous/anxious.    no chest pain no abdominal pain ROS otherwise negative     Objective:   Physical Exam  Alert no acute distress. HEENT normal. Vitals reviewed. Lungs clear. Heart rhythm irregular but controlled. Abdomen soft. Feet pulses good. Sensation intact. Trace edema with some changes of venous stasis      Assessment & Plan:  In impression #1 venous stasis  discussed. #2 type 2 diabetes suboptimum control. #3 hypertension good control. #4 atrial fibrillation stable. #5 peripheral neuropathy appears for now resolved. #6 sleep apnea. #7 hyperlipidemia. Status uncertain. Plan appropriate blood work. Diet exercise discussed. Increase metformin to 2 twice a day. Yearly eye exams. Check in 4 months. WSL

## 2013-03-16 LAB — LIPID PANEL
Cholesterol: 134 mg/dL (ref 0–200)
Total CHOL/HDL Ratio: 5.2 Ratio
Triglycerides: 191 mg/dL — ABNORMAL HIGH (ref <150)
VLDL: 38 mg/dL (ref 0–40)

## 2013-03-16 LAB — HEPATIC FUNCTION PANEL
ALT: 32 U/L (ref 0–53)
AST: 18 U/L (ref 0–37)
Bilirubin, Direct: 0.1 mg/dL (ref 0.0–0.3)
Indirect Bilirubin: 0.3 mg/dL (ref 0.0–0.9)
Total Protein: 6.3 g/dL (ref 6.0–8.3)

## 2013-03-16 LAB — BASIC METABOLIC PANEL
CO2: 24 mEq/L (ref 19–32)
Calcium: 9 mg/dL (ref 8.4–10.5)
Creat: 0.73 mg/dL (ref 0.50–1.35)
Glucose, Bld: 149 mg/dL — ABNORMAL HIGH (ref 70–99)
Sodium: 139 mEq/L (ref 135–145)

## 2013-03-16 LAB — MICROALBUMIN, URINE: Microalb, Ur: 1 mg/dL (ref 0.00–1.89)

## 2013-04-01 ENCOUNTER — Telehealth: Payer: Self-pay | Admitting: Family Medicine

## 2013-04-01 MED ORDER — METFORMIN HCL 500 MG PO TABS: 1000.0000 mg | ORAL_TABLET | Freq: Two times a day (BID) | ORAL | Status: DC

## 2013-04-01 MED ORDER — IPRATROPIUM-ALBUTEROL 18-103 MCG/ACT IN AERO: 2.0000 | INHALATION_SPRAY | Freq: Four times a day (QID) | RESPIRATORY_TRACT | Status: DC | PRN

## 2013-04-01 NOTE — Telephone Encounter (Signed)
Calling to check on patients BW results..   Also, Dr Brett Canales was supposed to call in some medications for him but [facility] states they dont have it. Metformin Inhaler and cream for legs

## 2013-04-01 NOTE — Telephone Encounter (Signed)
Reassure patient that his blood work looked good. Dr. Brett Canales did review it. If he would like a copy of it please offer to mail him a copy. As for the medication the best I can tell is that this had to do with metformin increasing it to take 2 tablets twice a day. This was reflected in his last note. Possibly it just was not sent in. You may have to go fishing with the patient regarding this to be certain that is what Dr. Brett Canales was discussing with him.

## 2013-04-01 NOTE — Telephone Encounter (Signed)
Blood work results discussed and meds sent electronically to [facility] in [place].

## 2013-04-01 NOTE — Telephone Encounter (Signed)
Patient stated that Dr. Brett Canales dx a rash that comes and goes on his legs and was going to prescribe some cream. Stated they wanted Dr. Brett Canales to see this message next week and prescribe the cream he talked to them about.

## 2013-04-08 ENCOUNTER — Other Ambulatory Visit: Payer: Self-pay

## 2013-04-08 MED ORDER — CLOTRIMAZOLE-BETAMETHASONE 1-0.05 % EX CREA: TOPICAL_CREAM | Freq: Two times a day (BID) | CUTANEOUS | Status: DC

## 2013-04-08 NOTE — Telephone Encounter (Signed)
lotrisone cr 30 g apply bid to affected area

## 2013-04-08 NOTE — Telephone Encounter (Signed)
RX sent into pharmacy. Wife was notified.  

## 2013-04-27 ENCOUNTER — Other Ambulatory Visit: Payer: Self-pay | Admitting: Family Medicine

## 2013-05-17 ENCOUNTER — Other Ambulatory Visit: Payer: Self-pay | Admitting: Adult Health

## 2013-06-07 ENCOUNTER — Ambulatory Visit (INDEPENDENT_AMBULATORY_CARE_PROVIDER_SITE_OTHER): Payer: 59 | Admitting: Adult Health

## 2013-06-07 ENCOUNTER — Encounter: Payer: Self-pay | Admitting: Adult Health

## 2013-06-07 VITALS — BP 158/89 | HR 113 | Ht 68.0 in | Wt 227.0 lb

## 2013-06-07 DIAGNOSIS — F17201 Nicotine dependence, unspecified, in remission: Secondary | ICD-10-CM

## 2013-06-07 DIAGNOSIS — I10 Essential (primary) hypertension: Secondary | ICD-10-CM

## 2013-06-07 DIAGNOSIS — I4891 Unspecified atrial fibrillation: Secondary | ICD-10-CM

## 2013-06-07 DIAGNOSIS — Z87891 Personal history of nicotine dependence: Secondary | ICD-10-CM

## 2013-06-07 NOTE — Assessment & Plan Note (Signed)
He has restarted smoking, and is unhappy about it. He is considering electronic cigarettes. I have encouraged him to do whatever he can to quit smoking.

## 2013-06-07 NOTE — Assessment & Plan Note (Signed)
>>  ASSESSMENT AND PLAN FOR TOBACCO USE DISORDER WRITTEN ON 06/07/2013  4:47 PM BY Jodelle Gross, NP  He has restarted smoking, and is unhappy about it. He is considering electronic cigarettes. I have encouraged him to do whatever he can to quit smoking.

## 2013-06-07 NOTE — Progress Notes (Signed)
HPI: [name] is a 57 year old former patient Dr. Dietrich [name], who will now be assigned to Dr. Christiane [name] branch, that we follow for nonobstructive CAD, paroxysmal atrial fibrillation, and hypertension. The patient was last seen in the office in February 2014. The patient was without cardiac complaint with the exception of occasional palpitations. Unfortunately continued to smoke. He was placed on Chantix graduated dosing for 3 months. He had had no history of seizures. Other than that, the patient was hemodynamically stable, and without any further complaints.    He has recently returned from [place], and has gained some fluid weight. He states that he came home and noticed swelling in his lower extremities, to a dose of chlorthalidone and a dose of potassium. He did get some relief of lower extremity edema but remains mildly edematous in his abdomen with weight gain of approximately 4 pounds. He denies any chest pain, dyspnea on exertion. His heart rate and BP is up a little today do to a stressful event prior to coming in.      No Known Allergies  Current Outpatient Prescriptions  Medication Sig Dispense Refill  . albuterol-ipratropium (COMBIVENT) 18-103 MCG/ACT inhaler Inhale 2 puffs into the lungs every 6 (six) hours as needed.  14.7 g  5  . aspirin 81 MG tablet Take 81 mg by mouth every morning.       . chlorthalidone (HYGROTON) 25 MG tablet Take 25 mg by mouth as needed (fluid retention).      . clotrimazole-betamethasone (LOTRISONE) cream Apply topically 2 (two) times daily.  30 g  0  . DIGOX 0.25 MG tablet TAKE 1 TABLET BY MOUTH EVERY MORNING  30 tablet  11  . ibuprofen (ADVIL,MOTRIN) 200 MG tablet Take 200 mg by mouth every 6 (six) hours as needed.       . loratadine (CLARITIN) 10 MG tablet Take 10 mg by mouth daily.      . metFORMIN (GLUCOPHAGE) 500 MG tablet Take 2 tablets (1,000 mg total) by mouth 2 (two) times daily.  60 tablet  5  . Multiple  Vitamins-Minerals (MULTIVITAMIN WITH MINERALS) tablet Take 1 tablet by mouth daily.        . mupirocin ointment (BACTROBAN) 2 %       . potassium chloride SA (K-DUR,KLOR-CON) 20 MEQ tablet Take 20 mEq by mouth as needed.      . pravastatin (PRAVACHOL) 20 MG tablet TAKE 1 TABLET BY MOUTH ONCE DAILY  30 tablet  11  . ranitidine (ZANTAC) 150 MG tablet Take 150 mg by mouth daily as needed for heartburn.      Marland Kitchen rOPINIRole (REQUIP) 2 MG tablet TAKE 1 TABLET BY MOUTH EVERY NIGHT AT BEDTIME  30 tablet  5  . sildenafil (VIAGRA) 50 MG tablet Take 1/2 to 2 tablets as needed  10 tablet  6  . TAZTIA XT 360 MG 24 hr capsule TAKE 1 CAPSULE BY MOUTH DAILY  30 capsule  1  . [DISCONTINUED] diltiazem (CARDIZEM CD) 360 MG 24 hr capsule Take 1 capsule (360 mg total) by mouth every evening.  30 capsule  6   No current facility-administered medications for this visit.    Past Medical History  Diagnosis Date  . Paroxysmal atrial fibrillation 10/2004    onset in 10/2004; recurred 09/2008  . Arteriosclerotic cardiovascular disease (ASCVD)     Nonobstructive; cath in 2000: 30-40% mid LAD and proximal RCA;normal EF. stress nuclear in 2006-subtle inferoseptal hypoperfusion with reversibility;  negative stress EKG; good exercise tolerance  . Hypertension   . Tobacco abuse     40 pack years  . Sinusitis   . Insomnia   . Obstructive sleep apnea 12/2009    01/26/2010 AHI 83/hr  . Diabetes mellitus without complication   . PUD (peptic ulcer disease)   . Hyperlipidemia   . RLS (restless legs syndrome)   . Asthma     Past Surgical History  Procedure Laterality Date  . Hernia repair  1986    Left inguinal  . Rotator cuff repair      Right    ZOX:WRUEAV of systems complete and found to be negative unless listed above   PHYSICAL EXAM BP 158/89  Pulse 113  Ht 5\' 8"  (1.727 m)  Wt 227 lb (102.967 kg)  BMI 34.52 kg/m2  General: Well developed, well nourished, in no acute distress Head: Eyes PERRLA, No xanthomas.    Normal cephalic and atramatic  Lungs: Clear bilaterally to auscultation and percussion. Heart: HRRR S1 S2, without MRG.  Pulses are 2+ & equal.            No carotid bruit. No JVD.  No abdominal bruits. No femoral bruits. Abdomen: Bowel sounds are positive, abdomen soft and non-tender without masses or                  Hernia's noted. Msk:  Back normal, normal gait. Normal strength and tone for age. Extremities: No clubbing, cyanosis or edema.  DP +1 Neuro: Alert and oriented X 3. Psych:  Good affect, responds appropriately  EKG: Atrial fib rate of 99 bpm. Incomplete RBBB. Nonspecific T-wave abnormalities.   ASSESSMENT AND PLAN

## 2013-06-07 NOTE — Assessment & Plan Note (Signed)
Slightly elevated today.He will have extra doses of chlorthalidone today and tomorrow 25 mg each day for two days with potassium, as he still has evidence of fluid retention. He will follow up in 3 months unless symptomatic.

## 2013-06-07 NOTE — Progress Notes (Deleted)
Name: Shaun [name]    DOB: 06-02-1956  Age: 57 y.o.  MR#: [id number]       PCP:  Harlow Asa, MD      Insurance: Payor: Cleatrice Burke / Plan: Advertising copywriter / Product Type: *No Product type* /   CC:    Chief Complaint  Patient presents with  . Atrial Fibrillation  . Hypertension    VS Filed Vitals:   06/07/13 1511  BP: 158/89  Pulse: 113  Height: 5\' 8"  (1.727 m)  Weight: 227 lb (102.967 kg)    Weights Current Weight  06/07/13 227 lb (102.967 kg)  03/13/13 227 lb 9.6 oz (103.239 kg)  12/19/12 224 lb (101.606 kg)    Blood Pressure  BP Readings from Last 3 Encounters:  06/07/13 158/89  03/13/13 120/78  12/20/12 119/72     Admit date:  (Not on file) Last encounter with RMR:  05/17/2013   Allergy Review of patient's allergies indicates no known allergies.  Current Outpatient Prescriptions  Medication Sig Dispense Refill  . albuterol-ipratropium (COMBIVENT) 18-103 MCG/ACT inhaler Inhale 2 puffs into the lungs every 6 (six) hours as needed.  14.7 g  5  . aspirin 81 MG tablet Take 81 mg by mouth every morning.       . chlorthalidone (HYGROTON) 25 MG tablet Take 25 mg by mouth as needed (fluid retention).      . clotrimazole-betamethasone (LOTRISONE) cream Apply topically 2 (two) times daily.  30 g  0  . DIGOX 0.25 MG tablet TAKE 1 TABLET BY MOUTH EVERY MORNING  30 tablet  11  . ibuprofen (ADVIL,MOTRIN) 200 MG tablet Take 200 mg by mouth every 6 (six) hours as needed.       . loratadine (CLARITIN) 10 MG tablet Take 10 mg by mouth daily.      . metFORMIN (GLUCOPHAGE) 500 MG tablet Take 2 tablets (1,000 mg total) by mouth 2 (two) times daily.  60 tablet  5  . Multiple Vitamins-Minerals (MULTIVITAMIN WITH MINERALS) tablet Take 1 tablet by mouth daily.        . mupirocin ointment (BACTROBAN) 2 %       . potassium chloride SA (K-DUR,KLOR-CON) 20 MEQ tablet Take 20 mEq by mouth as needed.      . pravastatin (PRAVACHOL) 20 MG tablet TAKE 1 TABLET BY MOUTH ONCE DAILY  30  tablet  11  . ranitidine (ZANTAC) 150 MG tablet Take 150 mg by mouth daily as needed for heartburn.      Marland Kitchen rOPINIRole (REQUIP) 2 MG tablet TAKE 1 TABLET BY MOUTH EVERY NIGHT AT BEDTIME  30 tablet  5  . sildenafil (VIAGRA) 50 MG tablet Take 1/2 to 2 tablets as needed  10 tablet  6  . TAZTIA XT 360 MG 24 hr capsule TAKE 1 CAPSULE BY MOUTH DAILY  30 capsule  1  . [DISCONTINUED] diltiazem (CARDIZEM CD) 360 MG 24 hr capsule Take 1 capsule (360 mg total) by mouth every evening.  30 capsule  6   No current facility-administered medications for this visit.    Discontinued Meds:   There are no discontinued medications.  Patient Active Problem List   Diagnosis Date Noted  . Diabetes mellitus 02/09/2011  . Arteriosclerotic cardiovascular disease (ASCVD)   . Tobacco abuse, in remission   . Hypertension   . HYPERLIPIDEMIA 02/23/2010  . Atrial fibrillation 02/23/2010  . Obstructive sleep apnea 12/27/2009    LABS    Component Value Date/Time   NA 139 03/16/2013  0835   NA 138 12/19/2012 2242   NA 139 12/09/2011 2351   K 4.3 03/16/2013 0835   K 3.4* 12/19/2012 2242   K 3.5 12/09/2011 2351   CL 106 03/16/2013 0835   CL 102 12/19/2012 2242   CL 104 12/09/2011 2351   CO2 24 03/16/2013 0835   CO2 24 12/19/2012 2242   CO2 25 12/09/2011 2351   GLUCOSE 149* 03/16/2013 0835   GLUCOSE 149* 12/19/2012 2242   GLUCOSE 112* 12/09/2011 2351   BUN 12 03/16/2013 0835   BUN 25* 12/19/2012 2242   BUN 16 12/09/2011 2351   CREATININE 0.73 03/16/2013 0835   CREATININE 0.78 12/19/2012 2242   CREATININE 0.81 12/09/2011 2351   CREATININE 0.90 02/05/2011 0700   CALCIUM 9.0 03/16/2013 0835   CALCIUM 9.5 12/19/2012 2242   CALCIUM 8.9 12/09/2011 2351   GFRNONAA >90 12/19/2012 2242   GFRNONAA >90 12/09/2011 2351   GFRNONAA >60 02/05/2011 0700   GFRAA >90 12/19/2012 2242   GFRAA >90 12/09/2011 2351   GFRAA >60 02/05/2011 0700   CMP     Component Value Date/Time   NA 139 03/16/2013 0835   K 4.3 03/16/2013 0835   CL 106 03/16/2013 0835    CO2 24 03/16/2013 0835   GLUCOSE 149* 03/16/2013 0835   BUN 12 03/16/2013 0835   CREATININE 0.73 03/16/2013 0835   CREATININE 0.78 12/19/2012 2242   CALCIUM 9.0 03/16/2013 0835   PROT 6.3 03/16/2013 0835   ALBUMIN 4.2 03/16/2013 0835   AST 18 03/16/2013 0835   ALT 32 03/16/2013 0835   ALKPHOS 68 03/16/2013 0835   BILITOT 0.4 03/16/2013 0835   GFRNONAA >90 12/19/2012 2242   GFRAA >90 12/19/2012 2242       Component Value Date/Time   WBC 11.2* 12/19/2012 2242   WBC 9.8 12/09/2011 2351   WBC 6.9 02/04/2011 0457   HGB 14.0 12/19/2012 2242   HGB 12.6* 12/09/2011 2351   HGB 11.8* 02/04/2011 0457   HCT 40.5 12/19/2012 2242   HCT 37.8* 12/09/2011 2351   HCT 34.6* 02/04/2011 0457   MCV 80.0 12/19/2012 2242   MCV 80.9 12/09/2011 2351   MCV 80.5 02/04/2011 0457    Lipid Panel     Component Value Date/Time   CHOL 134 03/16/2013 0835   TRIG 191* 03/16/2013 0835   HDL 26* 03/16/2013 0835   CHOLHDL 5.2 03/16/2013 0835   VLDL 38 03/16/2013 0835   LDLCALC 70 03/16/2013 0835    ABG No results found for this basename: phart, pco2, pco2art, po2, po2art, hco3, tco2, acidbasedef, o2sat     Lab Results  Component Value Date   TSH 0.550 02/03/2011   BNP (last 3 results) No results found for this basename: PROBNP,  in the last 8760 hours Cardiac Panel (last 3 results) No results found for this basename: CKTOTAL, CKMB, TROPONINI, RELINDX,  in the last 72 hours  Iron/TIBC/Ferritin No results found for this basename: iron, tibc, ferritin     EKG Orders placed in visit on 06/07/13  . EKG 12-LEAD     Prior Assessment and Plan Problem List as of 06/07/2013     Cardiovascular and Mediastinum   Atrial fibrillation   Last Assessment & Plan   10/02/2012 Office Visit Written 10/02/2012  4:20 PM by Jodelle Gross, NP     Currently in NSR with PAC's. He is not on anticoagulation at this time, but takes ASA daily. Will refill Taztia 360 mg daily. He is advised to cut  down on caffeine intake to avoid frequent  palpitations. He verbalizes understanding.     Arteriosclerotic cardiovascular disease (ASCVD)   Last Assessment & Plan   10/02/2012 Office Visit Written 10/02/2012  4:26 PM by Jodelle Gross, NP     He offers no complaints of recurrent chest pain. Will concentrate on risk factor management. Most importantly smoking cessation.    Hypertension   Last Assessment & Plan   10/02/2012 Office Visit Written 10/02/2012  4:25 PM by Jodelle Gross, NP     Elevated initially. I rechecked it in the exam room manually. BP 138/84. Will make no changes on current tx regimen.      Respiratory   Obstructive sleep apnea     Endocrine   Diabetes mellitus   Last Assessment & Plan   09/07/2011 Office Visit Written 09/07/2011 11:09 AM by Kathlen Brunswick, MD     Patient reports excellent control with CBGs consistently less than 130 and a recent A1c level of 6.6.      Other   HYPERLIPIDEMIA   Last Assessment & Plan   10/02/2012 Office Visit Written 10/02/2012  4:24 PM by Jodelle Gross, NP     Labs are followed by Dr. Gerda Diss. He is advised to increase exercise and activity to lose weight to increase HDL level and decrease LDL.     Tobacco abuse, in remission   Last Assessment & Plan   10/02/2012 Office Visit Written 10/02/2012  4:23 PM by Jodelle Gross, NP     He unfortunately continues to smoke. He is requesting help in cessation. We have discussed at length the different strategies, and health benefits to quitting. He will be placed on Chantix graduated dosing for 3 months. He has no history of seizures. I have spoken to him about the side effects of nightmares associated with use. He verbalizes understanding. He is to quit smoking 10 days after starting the medication. We also spoke about electronic cigarettes to help him with the habit of hand mouth addition. He may try this as well.         Imaging: No results found.

## 2013-06-07 NOTE — Patient Instructions (Addendum)
Your physician has recommended you make the following change in your medication: 6 months You will receive a reminder letter two months in advance reminding you to call and schedule your appointment. If you don't receive this letter, please contact our office.  Your physician has recommended you make the following change in your medication:  1. Take Chlorthalidone 25 mg 1 tablet today and 1 tablet tomorrow with Potassium.  Please call the office on Monday to see how much you have lost.  Atrial Fibrillation Your caregiver has diagnosed you with atrial fibrillation (AFib). The heart normally beats very regularly; AFib is a type of irregular heartbeat. The heart rate may be faster or slower than normal. This can prevent your heart from pumping as well as it should. AFib can be constant (chronic) or intermittent (paroxysmal). CAUSES  Atrial fibrillation may be caused by:  Heart disease, including heart attack, coronary artery disease, heart failure, diseases of the heart valves, and others.  Blood clot in the lungs (pulmonary embolism).  Pneumonia or other infections.  Chronic lung disease.  Thyroid disease.  Toxins. These include alcohol, some medications (such as decongestant medications or diet pills), and caffeine. In some people, no cause for AFib can be found. This is referred to as Lone Atrial Fibrillation. SYMPTOMS   Palpitations or a fluttering in your chest.  A vague sense of chest discomfort.  Shortness of breath.  Sudden onset of lightheadedness or weakness. Sometimes, the first sign of AFib can be a complication of the condition. This could be a stroke or heart failure. DIAGNOSIS  Your description of your condition may make your caregiver suspicious of atrial fibrillation. Your caregiver will examine your pulse to determine if fibrillation is present. An EKG (electrocardiogram) will confirm the diagnosis. Further testing may help determine what caused you to have atrial  fibrillation. This may include chest x-ray, echocardiogram, blood tests, or CT scans. PREVENTION  If you have previously had atrial fibrillation, your caregiver may advise you to avoid substances known to cause the condition (such as stimulant medications, and possibly caffeine or alcohol). You may be advised to use medications to prevent recurrence. Proper treatment of any underlying condition is important to help prevent recurrence. PROGNOSIS  Atrial fibrillation does tend to become a chronic condition over time. It can cause significant complications (see below). Atrial fibrillation is not usually immediately life-threatening, but it can shorten your life expectancy. This seems to be worse in women. If you have lone atrial fibrillation and are under 34 years old, the risk of complications is very low, and life expectancy is not shortened. RISKS AND COMPLICATIONS  Complications of atrial fibrillation can include stroke, chest pain, and heart failure. Your caregiver will recommend treatments for the atrial fibrillation, as well as for any underlying conditions, to help minimize risk of complications. TREATMENT  Treatment for AFib is divided into several categories:  Treatment of any underlying condition.  Converting you out of AFib into a regular (sinus) rhythm.  Controlling rapid heart rate.  Prevention of blood clots and stroke. Medications and procedures are available to convert your atrial fibrillation to sinus rhythm. However, recent studies have shown that this may not offer you any advantage, and cardiac experts are continuing research and debate on this topic. More important is controlling your rapid heartbeat. The rapid heartbeat causes more symptoms, and places strain on your heart. Your caregiver will advise you on the use of medications that can control your heart rate. Atrial fibrillation is a strong stroke  risk. You can lessen this risk by taking blood thinning medications such as  Coumadin (warfarin), or sometimes aspirin. These medications need close monitoring by your caregiver. Over-medication can cause bleeding. Too little medication may not protect against stroke. HOME CARE INSTRUCTIONS   If your caregiver prescribed medicine to make your heartbeat more normally, take as directed.  If blood thinners were prescribed by your caregiver, take EXACTLY as directed.  Perform blood tests EXACTLY as directed.  Quit smoking. Smoking increases your cardiac and lung (pulmonary) risks.  DO NOT drink alcohol.  DO NOT drink caffeinated drinks (e.g. coffee, soda, chocolate, and leaf teas). You may drink decaffeinated coffee, soda or tea.  If you are overweight, you should choose a reduced calorie diet to lose weight. Please see a registered dietitian if you need more information about healthy weight loss. DO NOT USE DIET PILLS as they may aggravate heart problems.  If you have other heart problems that are causing AFib, you may need to eat a low salt, fat, and cholesterol diet. Your caregiver will tell you if this is necessary.  Exercise every day to improve your physical fitness. Stay active unless advised otherwise.  If your caregiver has given you a follow-up appointment, it is very important to keep that appointment. Not keeping the appointment could result in heart failure or stroke. If there is any problem keeping the appointment, you must call back to this facility for assistance. SEEK MEDICAL CARE IF:  You notice a change in the rate, rhythm or strength of your heartbeat.  You develop an infection or any other change in your overall health status. SEEK IMMEDIATE MEDICAL CARE IF:   You develop chest pain, abdominal pain, sweating, weakness or feel sick to your stomach (nausea).  You develop shortness of breath.  You develop swollen feet and ankles.  You develop dizziness, numbness, or weakness of your face or limbs, or any change in vision or speech. MAKE SURE  YOU:   Understand these instructions.  Will watch your condition.  Will get help right away if you are not doing well or get worse. Document Released: 08/15/2005 Document Revised: 11/07/2011 Document Reviewed: 03/24/2010 ExitCare Patient Information 2014 ExitCare, [place]. 1.5 Gram Low Sodium Diet A 1.5 gram sodium diet restricts the amount of sodium in the diet to no more than 1.5 g or 1500 mg daily. The American Heart Association recommends Americans over the age of 40 to consume no more than 1500 mg of sodium each day to reduce the risk of developing high blood pressure. Research also shows that limiting sodium may reduce heart attack and stroke risk. Many foods contain sodium for flavor and sometimes as a preservative. When the amount of sodium in a diet needs to be low, it is important to know what to look for when choosing foods and drinks. The following includes some information and guidelines to help make it easier for you to adapt to a low sodium diet. QUICK TIPS  Do not add salt to food.  Avoid convenience items and fast food.  Choose unsalted snack foods.  Buy lower sodium products, often labeled as "lower sodium" or "no salt added."  Check food labels to learn how much sodium is in 1 serving.  When eating at a restaurant, ask that your food be prepared with less salt or none, if possible. READING FOOD LABELS FOR SODIUM INFORMATION The nutrition facts label is a good place to find how much sodium is in foods. Look for products with  no more than 400 mg of sodium per serving. Remember that 1.5 g = 1500 mg. The food label may also list foods as:  Sodium-free: Less than 5 mg in a serving.  Very low sodium: 35 mg or less in a serving.  Low-sodium: 140 mg or less in a serving.  Light in sodium: 50% less sodium in a serving. For example, if a food that usually has 300 mg of sodium is changed to become light in sodium, it will have 150 mg of sodium.  Reduced sodium: 25% less  sodium in a serving. For example, if a food that usually has 400 mg of sodium is changed to reduced sodium, it will have 300 mg of sodium. CHOOSING FOODS Grains  Avoid: Salted crackers and snack items. Some cereals, including instant hot cereals. Bread stuffing and biscuit mixes. Seasoned rice or pasta mixes.  Choose: Unsalted snack items. Low-sodium cereals, oats, puffed wheat and rice, shredded wheat. English muffins and bread. Pasta. Meats  Avoid: Salted, canned, smoked, spiced, pickled meats, including fish and poultry. Bacon, ham, sausage, cold cuts, hot dogs, anchovies.  Choose: Low-sodium canned tuna and salmon. Fresh or frozen meat, poultry, and fish. Dairy  Avoid: Processed cheese and spreads. Cottage cheese. Buttermilk and condensed milk. Regular cheese.  Choose: Milk. Low-sodium cottage cheese. Yogurt. Sour cream. Low-sodium cheese. Fruits and Vegetables  Avoid: Regular canned vegetables. Regular canned tomato sauce and paste. Frozen vegetables in sauces. Olives. Rosita Fire. Relishes. Sauerkraut.  Choose: Low-sodium canned vegetables. Low-sodium tomato sauce and paste. Frozen or fresh vegetables. Fresh and frozen fruit. Condiments  Avoid: Canned and packaged gravies. Worcestershire sauce. Tartar sauce. Barbecue sauce. Soy sauce. Steak sauce. Ketchup. Onion, garlic, and table salt. Meat flavorings and tenderizers.  Choose: Fresh and dried herbs and spices. Low-sodium varieties of mustard and ketchup. Lemon juice. Tabasco sauce. Horseradish. SAMPLE 1.5 GRAM SODIUM MEAL PLAN Breakfast / Sodium (mg)  1 cup low-fat milk / 143 mg  1 whole-wheat English muffin / 240 mg  1 tbs heart-healthy margarine / 153 mg  1 hard-boiled egg / 139 mg  1 small orange / 0 mg Lunch / Sodium (mg)  1 cup raw carrots / 76 mg  2 tbs no salt added peanut butter / 5 mg  2 slices whole-wheat bread / 270 mg  1 tbs jelly / 6 mg   cup red grapes / 2 mg Dinner / Sodium (mg)  1 cup  whole-wheat pasta / 2 mg  1 cup low-sodium tomato sauce / 73 mg  3 oz lean ground beef / 57 mg  1 small side salad (1 cup raw spinach leaves,  cup cucumber,  cup yellow bell pepper) with 1 tsp olive oil and 1 tsp red wine vinegar / 25 mg Snack / Sodium (mg)  1 container low-fat vanilla yogurt / 107 mg  3 graham cracker squares / 127 mg Nutrient Analysis  Calories: 1745  Protein: 75 g  Carbohydrate: 237 g  Fat: 57 g  Sodium: 1425 mg Document Released: 08/15/2005 Document Revised: 11/07/2011 Document Reviewed: 11/16/2009 ExitCare Patient Information 2014 [place], [place].

## 2013-06-07 NOTE — Assessment & Plan Note (Signed)
Rate is well controlled. He is a little stressed today from an event prior to coming in. Will not make any changes in his medications at this time.  Continue to watch salt intake. I have spent a good bit of time talking with him about salt intake.

## 2013-06-09 ENCOUNTER — Other Ambulatory Visit: Payer: Self-pay | Admitting: Family Medicine

## 2013-07-13 ENCOUNTER — Other Ambulatory Visit: Payer: Self-pay | Admitting: Family Medicine

## 2013-07-13 ENCOUNTER — Other Ambulatory Visit: Payer: Self-pay | Admitting: Adult Health

## 2013-08-14 ENCOUNTER — Other Ambulatory Visit: Payer: Self-pay | Admitting: Family Medicine

## 2013-08-19 ENCOUNTER — Telehealth: Payer: Self-pay | Admitting: *Deleted

## 2013-08-19 MED ORDER — CHLORTHALIDONE 25 MG PO TABS: 25.0000 mg | ORAL_TABLET | ORAL | Status: DC | PRN

## 2013-08-19 NOTE — Telephone Encounter (Signed)
Pt needs rx called in for chlorthalidone to [facility] in [place]/[facility]

## 2013-08-19 NOTE — Telephone Encounter (Signed)
Medication sent via escribe.  

## 2013-08-29 ENCOUNTER — Observation Stay (HOSPITAL_COMMUNITY)
Admission: EM | Admit: 2013-08-29 | Discharge: 2013-08-30 | Disposition: A | Discharge status: Home / Self Care | Payer: 59 | Attending: Internal Medicine | Admitting: Internal Medicine

## 2013-08-29 ENCOUNTER — Encounter (HOSPITAL_COMMUNITY): Payer: Self-pay | Admitting: Emergency Medicine

## 2013-08-29 DIAGNOSIS — Z87891 Personal history of nicotine dependence: Secondary | ICD-10-CM | POA: Insufficient documentation

## 2013-08-29 DIAGNOSIS — I2510 Atherosclerotic heart disease of native coronary artery without angina pectoris: Secondary | ICD-10-CM

## 2013-08-29 DIAGNOSIS — I482 Chronic atrial fibrillation, unspecified: Secondary | ICD-10-CM | POA: Diagnosis present

## 2013-08-29 DIAGNOSIS — Z9861 Coronary angioplasty status: Secondary | ICD-10-CM

## 2013-08-29 DIAGNOSIS — G4733 Obstructive sleep apnea (adult) (pediatric): Secondary | ICD-10-CM | POA: Insufficient documentation

## 2013-08-29 DIAGNOSIS — E119 Type 2 diabetes mellitus without complications: Secondary | ICD-10-CM

## 2013-08-29 DIAGNOSIS — E1165 Type 2 diabetes mellitus with hyperglycemia: Secondary | ICD-10-CM | POA: Diagnosis present

## 2013-08-29 DIAGNOSIS — I4891 Unspecified atrial fibrillation: Secondary | ICD-10-CM

## 2013-08-29 DIAGNOSIS — R079 Chest pain, unspecified: Principal | ICD-10-CM

## 2013-08-29 DIAGNOSIS — F17200 Nicotine dependence, unspecified, uncomplicated: Secondary | ICD-10-CM

## 2013-08-29 DIAGNOSIS — I10 Essential (primary) hypertension: Secondary | ICD-10-CM

## 2013-08-29 DIAGNOSIS — Z720 Tobacco use: Secondary | ICD-10-CM

## 2013-08-29 LAB — CBC WITH DIFFERENTIAL/PLATELET
BASOS PCT: 1 % (ref 0–1)
Basophils Absolute: 0 10*3/uL (ref 0.0–0.1)
Eosinophils Absolute: 0.2 10*3/uL (ref 0.0–0.7)
Eosinophils Relative: 2 % (ref 0–5)
HCT: 39.5 % (ref 39.0–52.0)
Hemoglobin: 13.4 g/dL (ref 13.0–17.0)
Lymphocytes Relative: 28 % (ref 12–46)
Lymphs Abs: 2.4 10*3/uL (ref 0.7–4.0)
MCH: 27.5 pg (ref 26.0–34.0)
MCHC: 33.9 g/dL (ref 30.0–36.0)
MCV: 81.1 fL (ref 78.0–100.0)
MONOS PCT: 6 % (ref 3–12)
Monocytes Absolute: 0.5 10*3/uL (ref 0.1–1.0)
NEUTROS ABS: 5.2 10*3/uL (ref 1.7–7.7)
NEUTROS PCT: 63 % (ref 43–77)
Platelets: 211 10*3/uL (ref 150–400)
RBC: 4.87 MIL/uL (ref 4.22–5.81)
RDW: 13.4 % (ref 11.5–15.5)
WBC: 8.3 10*3/uL (ref 4.0–10.5)

## 2013-08-29 LAB — TROPONIN I: Troponin I: <0.3 ng/mL (ref <0.30)

## 2013-08-29 LAB — PROTIME-INR
INR: 0.92 (ref 0.00–1.49)
PROTHROMBIN TIME: 12.2 s (ref 11.6–15.2)

## 2013-08-29 LAB — BASIC METABOLIC PANEL
BUN: 15 mg/dL (ref 6–23)
CHLORIDE: 98 meq/L (ref 96–112)
CO2: 25 mEq/L (ref 19–32)
Calcium: 9.1 mg/dL (ref 8.4–10.5)
Creatinine, Ser: 0.78 mg/dL (ref 0.50–1.35)
GFR calc Af Amer: >90 mL/min (ref >90)
GFR calc non Af Amer: >90 mL/min (ref >90)
Glucose, Bld: 202 mg/dL — ABNORMAL HIGH (ref 70–99)
POTASSIUM: 3.9 meq/L (ref 3.7–5.3)
Sodium: 138 mEq/L (ref 137–147)

## 2013-08-29 LAB — DIGOXIN LEVEL: DIGOXIN LVL: 0.8 ng/mL (ref 0.8–2.0)

## 2013-08-29 NOTE — ED Provider Notes (Signed)
CSN: [id number]     Arrival date & time 08/29/13  2153 History  This chart was scribed for Shaun Pollack, MD by Roxan Diesel, ED scribe.  This patient was seen in room APA14/APA14 and the patient's care was started at 10:44 PM.   Chief Complaint  Patient presents with  . Chest Pain    Patient is a 58 y.o. male presenting with chest pain. The history is provided by the patient. No language interpreter was used.  Chest Pain Pain location:  L chest Pain quality: sharp   Radiates to: across left chest. Pain radiates to the back: no   Pain severity:  Moderate Duration:  2 days Timing:  Intermittent Progression:  Worsening Chronicity:  New Associated symptoms: shortness of breath   Associated symptoms: no fever, no nausea and not vomiting     HPI Comments: Shaun [name] is a 58 y.o. male with h/o paroxysmal A-fib, DM, HTN, and hyperlipidemia who presents to the Emergency Department complaining of 2 days of intermittent worsening left-sided chest pain with associated mild SOB.  Pt describes pain as sharp and denies prior h/o the same.  He states it was coming and going initially but has been more persistent since today around 3 PM.  It occasionally radiates across the left chest.  Currently he denies pain.  He denies fevers, chills, nausea, or vomiting.  He denies h/o MI or prior heart-related pain.  He has had no recent catheterization.  Pt denies h/o DVT/PE.  He is on aspirin but is not on anticoagulants.. He took his aspirin today.  He does not know why he has never been placed on anticoagulants.  PCP is Rubbie Battiest, MD Cardiologist is Dr. Lattie Haw   Past Medical History  Diagnosis Date  . Paroxysmal atrial fibrillation 10/2004    onset in 10/2004; recurred 09/2008  . Arteriosclerotic cardiovascular disease (ASCVD)     Nonobstructive; cath in 2000: 30-40% mid LAD and proximal RCA;normal EF. stress nuclear in 2006-subtle inferoseptal hypoperfusion with reversibility; negative  stress EKG; good exercise tolerance  . Hypertension   . Tobacco abuse     40 pack years  . Sinusitis   . Insomnia   . Obstructive sleep apnea 12/2009    01/26/2010 AHI 83/hr  . Diabetes mellitus without complication   . PUD (peptic ulcer disease)   . Hyperlipidemia   . RLS (restless legs syndrome)   . Asthma     Past Surgical History  Procedure Laterality Date  . Hernia repair  1986    Left inguinal  . Rotator cuff repair      Right    Family History  Problem Relation Age of Onset  . Hypertension Mother   . Heart attack Father   . Diabetes Brother     History  Substance Use Topics  . Smoking status: Former Smoker -- 1.00 packs/day for 32 years    Types: Cigarettes    Quit date: 07/04/2010  . Smokeless tobacco: Never Used  . Alcohol Use: No     Review of Systems  Constitutional: Negative for fever and chills.  Respiratory: Positive for shortness of breath.   Cardiovascular: Positive for chest pain.  Gastrointestinal: Negative for nausea and vomiting.  All other systems reviewed and are negative.     Allergies  Review of patient's allergies indicates no known allergies.  Home Medications   Current Outpatient Rx  Name  Route  Sig  Dispense  Refill  . aspirin 81 MG tablet  Oral   Take 81 mg by mouth every morning.          . chlorthalidone (HYGROTON) 25 MG tablet   Oral   Take 1 tablet (25 mg total) by mouth as needed (fluid retention).   30 tablet   2   . digoxin (LANOXIN) 0.25 MG tablet   Oral   Take 0.25 mg by mouth daily.         Marland Kitchen diltiazem (TIAZAC) 360 MG 24 hr capsule   Oral   Take 360 mg by mouth daily.         Marland Kitchen ibuprofen (ADVIL,MOTRIN) 200 MG tablet   Oral   Take 600 mg by mouth every 6 (six) hours as needed.          . metFORMIN (GLUCOPHAGE) 500 MG tablet   Oral   Take 500 mg by mouth 2 (two) times daily.         . Multiple Vitamins-Minerals (MULTIVITAMIN WITH MINERALS) tablet   Oral   Take 1 tablet by mouth daily.            . Omega-3 Fatty Acids (FISH OIL PO)   Oral   Take 1 tablet by mouth daily.         Marland Kitchen omeprazole (PRILOSEC) 20 MG capsule   Oral   Take 20 mg by mouth daily as needed (Acid Reflux).         . potassium chloride SA (K-DUR,KLOR-CON) 20 MEQ tablet   Oral   Take 20 mEq by mouth as needed (Takes with chlorthalidone).          Marland Kitchen rOPINIRole (REQUIP) 2 MG tablet   Oral   Take 2 mg by mouth daily.         . sodium chloride (OCEAN) 0.65 % SOLN nasal spray   Each Nare   Place 1 spray into both nostrils as needed for congestion.         Marland Kitchen albuterol-ipratropium (COMBIVENT) 18-103 MCG/ACT inhaler   Inhalation   Inhale 2 puffs into the lungs every 6 (six) hours as needed.   14.7 g   5    BP 140/87  Pulse 79  Temp(Src) 97.8 F (36.6 C) (Oral)  Resp 20  SpO2 96%  Physical Exam  Nursing note and vitals reviewed. Constitutional: He is oriented to person, place, and time. He appears well-developed and well-nourished. No distress.  HENT:  Head: Normocephalic and atraumatic.  Eyes: EOM are normal.  Neck: Neck supple. No tracheal deviation present.  Cardiovascular: Normal rate and normal heart sounds.  An irregularly irregular rhythm present.  No murmur heard. Pulmonary/Chest: Effort normal and breath sounds normal. No respiratory distress. He has no wheezes. He has no rales.  Musculoskeletal: Normal range of motion.  Neurological: He is alert and oriented to person, place, and time.  Skin: Skin is warm and dry.  Psychiatric: He has a normal mood and affect. His behavior is normal.    ED Course  Procedures (including critical care time)  DIAGNOSTIC STUDIES: Oxygen Saturation is 96% on room air, normal by my interpretation.    COORDINATION OF CARE: 10:51 PM-Discussed treatment plan which includes EKG and labs with pt at bedside and pt agreed to plan.    Labs Review Labs Reviewed  BASIC METABOLIC PANEL - Abnormal; Notable for the following:    Glucose, Bld  202 (*)    All other components within normal limits  CBC WITH DIFFERENTIAL  TROPONIN I  PROTIME-INR  DIGOXIN  LEVEL    Imaging Review No results found.   EKG Interpretation   None       Date: 08/29/2013  Rate: 78  Rhythm: atrial fibrillation  QRS Axis: normal  Intervals: normal  ST/T Wave abnormalities: nonspecific ST changes  Conduction Disutrbances:none  Narrative Interpretation:   Old EKG Reviewed: unchanged    MDM  No diagnosis found. 58 y.o. Male with sharp left sided chest pain.  Patient with history of atrial fibrillation and continues in atrial fibrillation.  He is not anticoagulated.  He is pain free on my evaluation.  Plan further assessment in hospital to rule out mi and cardiology consult.  He has previously been evaluated by [facility] cardiology.    I personally performed the services described in this documentation, which was scribed in my presence. The recorded information has been reviewed and considered.   Shaun Pollack, MD 08/29/13 [phone]

## 2013-08-29 NOTE — ED Notes (Signed)
Started having chest pain 2 days ago per s/o

## 2013-08-30 ENCOUNTER — Encounter (HOSPITAL_COMMUNITY): Payer: Self-pay | Admitting: Internal Medicine

## 2013-08-30 ENCOUNTER — Encounter: Payer: Self-pay | Admitting: *Deleted

## 2013-08-30 ENCOUNTER — Encounter: Payer: Self-pay | Admitting: Family Medicine

## 2013-08-30 ENCOUNTER — Observation Stay (HOSPITAL_COMMUNITY): Payer: 59

## 2013-08-30 DIAGNOSIS — I10 Essential (primary) hypertension: Secondary | ICD-10-CM

## 2013-08-30 DIAGNOSIS — I4891 Unspecified atrial fibrillation: Secondary | ICD-10-CM

## 2013-08-30 DIAGNOSIS — E119 Type 2 diabetes mellitus without complications: Secondary | ICD-10-CM

## 2013-08-30 DIAGNOSIS — R079 Chest pain, unspecified: Secondary | ICD-10-CM | POA: Diagnosis present

## 2013-08-30 DIAGNOSIS — I2510 Atherosclerotic heart disease of native coronary artery without angina pectoris: Secondary | ICD-10-CM

## 2013-08-30 DIAGNOSIS — I7090 Unspecified atherosclerosis: Secondary | ICD-10-CM

## 2013-08-30 LAB — TROPONIN I
Troponin I: <0.3 ng/mL (ref <0.30)
Troponin I: <0.3 ng/mL (ref <0.30)

## 2013-08-30 LAB — HEPATIC FUNCTION PANEL
ALT: 38 U/L (ref 0–53)
AST: 24 U/L (ref 0–37)
Albumin: 3.9 g/dL (ref 3.5–5.2)
Alkaline Phosphatase: 59 U/L (ref 39–117)
Total Bilirubin: 0.3 mg/dL (ref 0.3–1.2)
Total Protein: 7.2 g/dL (ref 6.0–8.3)

## 2013-08-30 LAB — GLUCOSE, CAPILLARY
Glucose-Capillary: 133 mg/dL — ABNORMAL HIGH (ref 70–99)
Glucose-Capillary: 134 mg/dL — ABNORMAL HIGH (ref 70–99)
Glucose-Capillary: 134 mg/dL — ABNORMAL HIGH (ref 70–99)
Glucose-Capillary: 128 mg/dL — ABNORMAL HIGH (ref 70–99)

## 2013-08-30 MED ORDER — SODIUM CHLORIDE 0.9 % IV SOLN: 250.0000 mL | INTRAVENOUS | Status: DC | PRN

## 2013-08-30 MED ORDER — LEVOFLOXACIN 500 MG PO TABS
500.0000 mg | ORAL_TABLET | Freq: Every day | ORAL | Status: DC
  Administered 2013-08-30: 500 mg via ORAL
  Filled 2013-08-30: qty 1

## 2013-08-30 MED ORDER — PANTOPRAZOLE SODIUM 40 MG PO TBEC
40.0000 mg | DELAYED_RELEASE_TABLET | Freq: Every day | ORAL | Status: DC
  Administered 2013-08-30: 40 mg via ORAL
  Filled 2013-08-30: qty 1

## 2013-08-30 MED ORDER — ACETAMINOPHEN 325 MG PO TABS: 650.0000 mg | ORAL_TABLET | Freq: Four times a day (QID) | ORAL | Status: DC | PRN

## 2013-08-30 MED ORDER — IPRATROPIUM-ALBUTEROL 18-103 MCG/ACT IN AERO
2.0000 | INHALATION_SPRAY | Freq: Four times a day (QID) | RESPIRATORY_TRACT | Status: DC | PRN
  Filled 2013-08-30: qty 14.7

## 2013-08-30 MED ORDER — SODIUM CHLORIDE 0.9 % IJ SOLN
3.0000 mL | Freq: Two times a day (BID) | INTRAMUSCULAR | Status: DC
  Administered 2013-08-30 (×2): 3 mL via INTRAVENOUS

## 2013-08-30 MED ORDER — ROPINIROLE HCL 1 MG PO TABS
2.0000 mg | ORAL_TABLET | Freq: Every day | ORAL | Status: DC
  Administered 2013-08-30: 2 mg via ORAL
  Filled 2013-08-30 (×4): qty 2

## 2013-08-30 MED ORDER — DILTIAZEM HCL ER COATED BEADS 180 MG PO CP24
360.0000 mg | ORAL_CAPSULE | Freq: Every day | ORAL | Status: DC
  Administered 2013-08-30: 360 mg via ORAL
  Filled 2013-08-30: qty 2

## 2013-08-30 MED ORDER — SODIUM CHLORIDE 0.9 % IJ SOLN
3.0000 mL | INTRAMUSCULAR | Status: DC | PRN
Start: 2013-08-30 — End: 2013-08-30

## 2013-08-30 MED ORDER — SODIUM CHLORIDE 0.9 % IJ SOLN
3.0000 mL | Freq: Two times a day (BID) | INTRAMUSCULAR | Status: DC
  Administered 2013-08-30: 3 mL via INTRAVENOUS

## 2013-08-30 MED ORDER — GI COCKTAIL ~~LOC~~
30.0000 mL | Freq: Once | ORAL | Status: AC
  Administered 2013-08-30: 30 mL via ORAL

## 2013-08-30 MED ORDER — ONDANSETRON HCL 4 MG/2ML IJ SOLN: 4.0000 mg | Freq: Four times a day (QID) | INTRAMUSCULAR | Status: DC | PRN

## 2013-08-30 MED ORDER — ENOXAPARIN SODIUM 40 MG/0.4ML ~~LOC~~ SOLN
40.0000 mg | SUBCUTANEOUS | Status: DC
Start: 2013-08-30 — End: 2013-08-30
  Administered 2013-08-30: 40 mg via SUBCUTANEOUS
  Filled 2013-08-30: qty 0.4

## 2013-08-30 MED ORDER — DILTIAZEM HCL ER BEADS 240 MG PO CP24
360.0000 mg | ORAL_CAPSULE | Freq: Every day | ORAL | Status: DC
  Filled 2013-08-30: qty 1

## 2013-08-30 MED ORDER — ALBUTEROL SULFATE (2.5 MG/3ML) 0.083% IN NEBU
2.5000 mg | INHALATION_SOLUTION | RESPIRATORY_TRACT | Status: DC | PRN
Start: 2013-08-30 — End: 2013-08-30

## 2013-08-30 MED ORDER — INSULIN ASPART 100 UNIT/ML ~~LOC~~ SOLN
0.0000 [IU] | Freq: Three times a day (TID) | SUBCUTANEOUS | Status: DC
  Administered 2013-08-30 (×2): 2 [IU] via SUBCUTANEOUS

## 2013-08-30 MED ORDER — ASPIRIN 81 MG PO TABS
81.0000 mg | ORAL_TABLET | ORAL | Status: DC
  Administered 2013-08-30: 81 mg via ORAL
  Filled 2013-08-30 (×7): qty 1

## 2013-08-30 MED ORDER — DIGOXIN 125 MCG PO TABS
0.2500 mg | ORAL_TABLET | Freq: Every day | ORAL | Status: DC
  Administered 2013-08-30: 0.25 mg via ORAL
  Filled 2013-08-30: qty 2

## 2013-08-30 MED ORDER — INFLUENZA VAC SPLIT QUAD 0.5 ML IM SUSP
0.5000 mL | INTRAMUSCULAR | Status: DC
  Filled 2013-08-30: qty 0.5

## 2013-08-30 MED ORDER — ACETAMINOPHEN 650 MG RE SUPP: 650.0000 mg | Freq: Four times a day (QID) | RECTAL | Status: DC | PRN

## 2013-08-30 MED ORDER — ROPINIROLE HCL 1 MG PO TABS
ORAL_TABLET | ORAL | Status: AC
  Filled 2013-08-30: qty 2

## 2013-08-30 MED ORDER — ONDANSETRON HCL 4 MG PO TABS: 4.0000 mg | ORAL_TABLET | Freq: Four times a day (QID) | ORAL | Status: DC | PRN

## 2013-08-30 MED ORDER — GI COCKTAIL ~~LOC~~
ORAL | Status: AC
  Filled 2013-08-30: qty 30

## 2013-08-30 NOTE — Discharge Summary (Signed)
Physician Discharge Summary  Shaun [name] NOM:[id number] DOB: [birth year] DOA: 08/29/2013  PCP: Rubbie Battiest, MD  Admit date: 08/29/2013 Discharge date: 08/30/2013  Time spent: >35 minutes  Recommendations for Outpatient Follow-up:  F/u with PCP next week F/u with cardiology in 1 week as scheduled  Discharge Diagnoses:  Principal Problem:   Chest pain Active Problems:   Atrial fibrillation   Arteriosclerotic cardiovascular disease (ASCVD)   Tobacco abuse, in remission   Obstructive sleep apnea   Diabetes mellitus   Discharge Condition: stable   Diet recommendation: heart healthy   Filed Weights   08/30/13 0114  Weight: 103 kg (227 lb 1.2 oz)    History of present illness:  58 y.o. male with a past medical history of atrial fibrillation, not on anticoagulation, diabetes mellitus, type II, hypertension, who was in his usual state of health till a day ago, when he started noticing on and off chest pain.    Hospital Course:  Patient is seen, evaluated by cardiology who recommended outpatient stress  Test and follow up; ECG, trop remains neg; chest pain resolved; cardiology plan to discuss possible long term anticoagulation as outpatient, defer to cardiologist;    Procedures:  None  (i.e. Studies not automatically included, echos, thoracentesis, etc; not x-rays)  Consultations:  Cardiology   Discharge Exam: Filed Vitals:   08/30/13 1356  BP: 144/82  Pulse: 63  Temp: 97.5 F (36.4 C)  Resp: 18    General: alert  Cardiovascular: s1,s2 rrr Respiratory: CTA BL  Discharge Instructions  Discharge Orders   Future Appointments Provider Department Dept Phone   09/03/2013 11:00 AM Ap-[facility] Stress Lab [place] [phone]   09/05/2013 1:00 PM Ap-Cardiopul Echo Lab [place] [phone]   09/17/2013 2:20 PM Arnoldo Lenis, MD [facility] [place] [phone]   Future Orders Complete By Expires   Diet - low  sodium heart healthy  As directed    Discharge instructions  As directed    Comments:     See your cardiologist as OP Follow up with their rec's   Increase activity slowly  As directed        Medication List    STOP taking these medications       ibuprofen 200 MG tablet  Commonly known as:  ADVIL,MOTRIN      TAKE these medications       albuterol-ipratropium 18-103 MCG/ACT inhaler  Commonly known as:  COMBIVENT  Inhale 2 puffs into the lungs every 6 (six) hours as needed.     aspirin 81 MG tablet  Take 81 mg by mouth every morning.     chlorthalidone 25 MG tablet  Commonly known as:  HYGROTON  Take 1 tablet (25 mg total) by mouth as needed (fluid retention).     digoxin 0.25 MG tablet  Commonly known as:  LANOXIN  Take 0.25 mg by mouth daily.     diltiazem 360 MG 24 hr capsule  Commonly known as:  TIAZAC  Take 360 mg by mouth daily.     FISH OIL PO  Take 1 tablet by mouth daily.     metFORMIN 500 MG tablet  Commonly known as:  GLUCOPHAGE  Take 500 mg by mouth 2 (two) times daily.     multivitamin with minerals tablet  Take 1 tablet by mouth daily.     omeprazole 20 MG capsule  Commonly known as:  PRILOSEC  Take 20 mg by mouth daily as needed (Acid Reflux).  potassium chloride SA 20 MEQ tablet  Commonly known as:  K-DUR,KLOR-CON  Take 20 mEq by mouth as needed (Takes with chlorthalidone).     rOPINIRole 2 MG tablet  Commonly known as:  REQUIP  Take 2 mg by mouth daily.     sodium chloride 0.65 % Soln nasal spray  Commonly known as:  OCEAN  Place 1 spray into both nostrils as needed for congestion.       No Known Allergies     Follow-up Information   Follow up with Rubbie Battiest, MD In 1 week.   Specialty:  Family Medicine   Contact information:   [address] [place] [address] [phone]        The results of significant diagnostics from this hospitalization (including imaging, microbiology, ancillary and laboratory) are  listed below for reference.    Significant Diagnostic Studies: Dg Chest Port 1 View  08/30/2013   CLINICAL DATA:  Chest pain for the past 2 days. History of atrial fibrillation.  EXAM: PORTABLE CHEST - 1 VIEW  COMPARISON:  Chest x-ray 12/19/2012.  FINDINGS: Diffuse peribronchial cuffing. No acute consolidative airspace disease. No pleural effusions. Cephalization of the pulmonary vasculature, without frank pulmonary edema. Mild cardiomegaly. Upper mediastinal contours are within normal limits.  IMPRESSION: 1. Diffuse peribronchial cuffing suggestive of acute bronchitis. 2. Cardiomegaly with pulmonary venous congestion, but no frank pulmonary edema.   Electronically Signed   By: Vinnie Langton M.D.   On: 08/30/2013 00:34    Microbiology: No results found for this or any previous visit (from the past 240 hour(s)).   Labs: Basic Metabolic Panel:  Recent Labs Lab 08/29/13 2242  NA 138  K 3.9  CL 98  CO2 25  GLUCOSE 202*  BUN 15  CREATININE 0.78  CALCIUM 9.1   Liver Function Tests:  Recent Labs Lab 08/30/13 0034  AST 24  ALT 38  ALKPHOS 59  BILITOT 0.3  PROT 7.2  ALBUMIN 3.9   No results found for this basename: LIPASE, AMYLASE,  in the last 168 hours No results found for this basename: AMMONIA,  in the last 168 hours CBC:  Recent Labs Lab 08/29/13 2242  WBC 8.3  NEUTROABS 5.2  HGB 13.4  HCT 39.5  MCV 81.1  PLT 211   Cardiac Enzymes:  Recent Labs Lab 08/29/13 2242 08/30/13 0114 08/30/13 0703 08/30/13 1338  TROPONINI <0.30 <0.30 <0.30 <0.30   BNP: BNP (last 3 results) No results found for this basename: PROBNP,  in the last 8760 hours CBG:  Recent Labs Lab 08/30/13 0139 08/30/13 0621 08/30/13 0731 08/30/13 1134  GLUCAP 133* 134* 134* 128*       Signed:  Rowe Clack N  [facility] 08/30/2013, 3:19 PM

## 2013-08-30 NOTE — Progress Notes (Signed)
[facility] PROGRESS NOTE  Shaun [name] DSK:[id number] DOB: 12-13-1955 DOA: 08/29/2013 PCP: Shaun Battiest, MD  Assessment/Plan: Patient is seen examined; patient is pain free, not in distress; awaiting cardiology opinion; add atx for chronic COPD:  -plesase see H&P for details;   Shaun [name]  [facility] Pager [phone]. If 7PM-7AM, please contact night-coverage at [email or web address], password [facility] 08/30/2013, 8:34 AM  LOS: 1 day

## 2013-08-30 NOTE — Discharge Planning (Signed)
Pt stated he was ready to be [facility] and he had no pain.  Pt's IV was removed.  With family in room, pt was given DC instructions and told of FU appointments.  Pt was also educated on S/sx of a heart attack and when to call the doctor or return to the hospital.  Pt also educated on infection prevention.  Pt given note for work (to refraining from working  till 09/02/13.)  Pt will be wheeled to car by PCT and family once ready.

## 2013-08-30 NOTE — Consult Note (Signed)
Primary cardiologist: Dr. Carlyle Dolly Consulting cardiologist: Dr. Satira Sark  Clinical Summary [name] is a 58 y.o.male, former patient of Dr. Lattie Haw who is now establishing with Dr. Harl Bowie. Cardiac history is noted below. He was last seen in the office by Ms. Lawrence NP in October. He is now admitted to the hospital reporting a recent "cold" a few weeks ago, continued problems with cough and chest congestion since then. More recently he had a "sharp," very focal left lower chest discomfort associated with these symptoms. He has also had some associated belching. No clear-cut exertional component. His chest x-ray is suggestive of bronchitis.  ECG shows rate-controlled atrial fibrillation with no acute ST segment changes. This is a known diagnosis. It does not appear that he has been anticoagulated long-term.  He is being treated with antibiotics by the primary team. Chest pain has resolved, and his cardiac markers are normal.  No Known Allergies  Medications Scheduled Medications: . aspirin  81 mg Oral BH-q7a  . digoxin  0.25 mg Oral Daily  . diltiazem  360 mg Oral Daily  . enoxaparin (LOVENOX) injection  40 mg Subcutaneous Q24H  . [START ON 08/31/2013] influenza vac split quadrivalent PF  0.5 mL Intramuscular Tomorrow-1000  . insulin aspart  0-15 Units Subcutaneous TID WC  . levofloxacin  500 mg Oral Daily  . pantoprazole  40 mg Oral Daily  . rOPINIRole  2 mg Oral Daily  . sodium chloride  3 mL Intravenous Q12H  . sodium chloride  3 mL Intravenous Q12H     PRN Medications: sodium chloride, acetaminophen, acetaminophen, albuterol, albuterol-ipratropium, ondansetron (ZOFRAN) IV, ondansetron, sodium chloride   Past Medical History  Diagnosis Date  . Paroxysmal atrial fibrillation 10/2004    Onset in 10/2004; recurred 09/2008  . Arteriosclerotic cardiovascular disease (ASCVD)     Nonobstructive; cath in 2000: 30-40% mid LAD and proximal RCA;normal EF. stress  nuclear in 2006-subtle inferoseptal hypoperfusion with reversibility; negative stress EKG; good exercise tolerance  . Hypertension   . Sinusitis   . Insomnia   . Obstructive sleep apnea 12/2009    01/26/2010 AHI 83/hr  . Diabetes mellitus without complication   . PUD (peptic ulcer disease)   . Hyperlipidemia   . RLS (restless legs syndrome)   . Asthma     Past Surgical History  Procedure Laterality Date  . Hernia repair  1986    Left inguinal  . Rotator cuff repair      Right    Family History  Problem Relation Age of Onset  . Hypertension Mother   . Heart attack Father   . Diabetes Brother     Social History Mr. Conley reports that he quit smoking about 8 weeks ago. His smoking use included Cigarettes. He has a 32 pack-year smoking history. He has never used smokeless tobacco. Mr. Bergquist reports that he does not drink alcohol.  Review of Systems No claudication, does state that his sleep cycle is disturbed, he has been working nights recently. Feels fatigued. No reproducible exertional chest pain. Otherwise as outlined above.  Physical Examination Blood pressure 138/82, pulse 68, temperature 97.2 F (36.2 C), temperature source Oral, resp. rate 17, height 5\' 8"  (1.727 m), weight 227 lb 1.2 oz (103 kg), SpO2 97.00%.  Intake/Output Summary (Last 24 hours) at 08/30/13 1319 Last data filed at 08/30/13 1200  Gross per 24 hour  Intake      0 ml  Output    250 ml  Net   -  250 ml   Overweight male, no distress.  HEENT: Conjunctiva and lids normal, oropharynx clear. Neck: Supple, no elevated JVP or carotid bruits, no thyromegaly. Lungs: Diminished breath sounds, no egophony, nonlabored breathing at rest. Cardiac: Irregularly irregular, no S3 or significant systolic murmur, no pericardial rub. Abdomen: Soft, nontender, bowel sounds present, no guarding or rebound. Extremities: No pitting edema, distal pulses 2+. Skin: Warm and dry. Musculoskeletal: No  kyphosis. Neuropsychiatric: Alert and oriented x3, affect grossly appropriate.   Lab Results  Basic Metabolic Panel:  Recent Labs Lab 08/29/13 2242  NA 138  K 3.9  CL 98  CO2 25  GLUCOSE 202*  BUN 15  CREATININE 0.78  CALCIUM 9.1    Liver Function Tests:  Recent Labs Lab 08/30/13 0034  AST 24  ALT 38  ALKPHOS 59  BILITOT 0.3  PROT 7.2  ALBUMIN 3.9    CBC:  Recent Labs Lab 08/29/13 2242  WBC 8.3  NEUTROABS 5.2  HGB 13.4  HCT 39.5  MCV 81.1  PLT 211    Cardiac Enzymes:  Recent Labs Lab 08/29/13 2242 08/30/13 0114 08/30/13 0703  TROPONINI <0.30 <0.30 <0.30    Imaging PORTABLE CHEST - 1 VIEW  COMPARISON: Chest x-ray 12/19/2012.  FINDINGS: Diffuse peribronchial cuffing. No acute consolidative airspace disease. No pleural effusions. Cephalization of the pulmonary vasculature, without frank pulmonary edema. Mild cardiomegaly. Upper mediastinal contours are within normal limits.  IMPRESSION: 1. Diffuse peribronchial cuffing suggestive of acute bronchitis. 2. Cardiomegaly with pulmonary venous congestion, but no frank pulmonary edema.   Impression  1. Atypical chest pain as outlined. No clear evidence of ACS with normal cardiac markers and no significant ST segment changes by ECG.  2. Possible bronchitis, now on antibiotics.  3. Atrial fibrillation, presently not anticoagulated. History suggests PAF, although may be persistent. CHADSVASC score 3. Rate controlled.  4. History of mild nonobstructive CAD at cardiac catheterization in 2000. Myoview in 2006 indicated a small ischemic region in the inferoseptal wall, LVEF 71%. He has been managed medically.   Recommendations  Discussed history with the patient and family members present. He has ruled out for ACS and is clinically stable today, will likely be discharged. For now would continue current regimen which includes aspirin, Lanoxin, Cardizem CD. He will be scheduled for a followup  outpatient Lexiscan Cardiolite when he is over his bronchitis, also an echocardiogram. Office followup will then be arranged with Dr. Harl Bowie to review the studies, and discuss potential indications for considering long-term anticoagulation instead of aspirin.   Satira Sark, M.D., F.A.C.C.

## 2013-08-30 NOTE — H&P (Addendum)
[facility] History and Physical  Shaun [name] AXE:[id number] DOB: [birth year] DOA: 08/29/2013   PCP: Rubbie Battiest, MD  Specialists: Followed by [facility]  Chief Complaint: Chest pain since yesterday  HPI: Shaun [name] is a 58 y.o. male with a past medical history of atrial fibrillation, not on anticoagulation, diabetes mellitus, type II, hypertension, who was in his usual state of health till a day ago, when he started noticing on and off chest pain. He is a poor historian and is unable to describe his symptoms well, but he tells me that he felt "uncomfortable" in the left side of his chest. And then it became a sharp type pain, which was 4 of 10 in intensity. There was no radiation. No shortness of breath. No nausea, vomiting, or cough. Denies any fever or chills. Denies any leg swelling. By the time I evaluated the patient, his sharp pain had resolved and he again mentioned this uncomfortable feeling in the left side of his chest. There was no diaphoresis. No dizziness or lightheadedness. He takes aspirin on a daily basis, but didn't take any other medications apart from his usual home medications. His wife also mentioned, that along with this chest discomfort he has had a lot of belching and burping as well. Denies any abdominal pain. He reports a stress test about 4 years ago, which was unremarkable. His records suggest that he had a stress test in 2006. He tells me that his cardiac catheterization was in 1999-2000 which showed minimal coronary artery disease.  Home Medications: Prior to Admission medications   Medication Sig Start Date End Date Taking? Authorizing Provider  aspirin 81 MG tablet Take 81 mg by mouth every morning.    Yes Historical Provider, MD  chlorthalidone (HYGROTON) 25 MG tablet Take 1 tablet (25 mg total) by mouth as needed (fluid retention). 08/19/13  Yes Lendon Colonel, NP  digoxin (LANOXIN) 0.25 MG tablet Take 0.25 mg by mouth daily.   Yes  Historical Provider, MD  diltiazem (TIAZAC) 360 MG 24 hr capsule Take 360 mg by mouth daily.   Yes Historical Provider, MD  ibuprofen (ADVIL,MOTRIN) 200 MG tablet Take 600 mg by mouth every 6 (six) hours as needed.    Yes Historical Provider, MD  metFORMIN (GLUCOPHAGE) 500 MG tablet Take 500 mg by mouth 2 (two) times daily. 04/01/13  Yes Kathyrn Drown, MD  Multiple Vitamins-Minerals (MULTIVITAMIN WITH MINERALS) tablet Take 1 tablet by mouth daily.     Yes Historical Provider, MD  Omega-3 Fatty Acids (FISH OIL PO) Take 1 tablet by mouth daily.   Yes Historical Provider, MD  omeprazole (PRILOSEC) 20 MG capsule Take 20 mg by mouth daily as needed (Acid Reflux).   Yes Historical Provider, MD  potassium chloride SA (K-DUR,KLOR-CON) 20 MEQ tablet Take 20 mEq by mouth as needed (Takes with chlorthalidone).    Yes Historical Provider, MD  rOPINIRole (REQUIP) 2 MG tablet Take 2 mg by mouth daily.   Yes Historical Provider, MD  sodium chloride (OCEAN) 0.65 % SOLN nasal spray Place 1 spray into both nostrils as needed for congestion.   Yes Historical Provider, MD  albuterol-ipratropium (COMBIVENT) 18-103 MCG/ACT inhaler Inhale 2 puffs into the lungs every 6 (six) hours as needed. 04/01/13   Kathyrn Drown, MD    Allergies: No Known Allergies  Past Medical History: Past Medical History  Diagnosis Date  . Paroxysmal atrial fibrillation 10/2004    onset in 10/2004; recurred 09/2008  . Arteriosclerotic cardiovascular disease (  ASCVD)     Nonobstructive; cath in 2000: 30-40% mid LAD and proximal RCA;normal EF. stress nuclear in 2006-subtle inferoseptal hypoperfusion with reversibility; negative stress EKG; good exercise tolerance  . Hypertension   . Tobacco abuse     40 pack years  . Sinusitis   . Insomnia   . Obstructive sleep apnea 12/2009    01/26/2010 AHI 83/hr  . Diabetes mellitus without complication   . PUD (peptic ulcer disease)   . Hyperlipidemia   . RLS (restless legs syndrome)   . Asthma      Past Surgical History  Procedure Laterality Date  . Hernia repair  1986    Left inguinal  . Rotator cuff repair      Right    Social History: Patient lives with his wife. He had quit smoking but then resumed again and for the last few months he has been using the vapor ecigarettes. Denies any alcohol use. No illicit drug use independent with daily activities.  Family History:  Family History  Problem Relation Age of Onset  . Hypertension Mother   . Heart attack Father   . Diabetes Brother      Review of Systems - History obtained from the patient General ROS: negative Psychological ROS: negative Ophthalmic ROS: negative ENT ROS: negative Allergy and Immunology ROS: negative Hematological and Lymphatic ROS: negative Endocrine ROS: negative Respiratory ROS: no cough, shortness of breath, or wheezing Cardiovascular ROS: as in hpi Gastrointestinal ROS: no abdominal pain, change in bowel habits, or black or bloody stools Genito-Urinary ROS: no dysuria, trouble voiding, or hematuria Musculoskeletal ROS: negative Neurological ROS: no TIA or stroke symptoms Dermatological ROS: negative  Physical Examination  Filed Vitals:   08/29/13 2215 08/30/13 0013  BP: 140/87 137/63  Pulse: 79 66  Temp: 97.8 F (36.6 C) 97.7 F (36.5 C)  TempSrc: Oral Oral  Resp: 20 16  SpO2: 96% 96%    General appearance: alert, cooperative, appears stated age and no distress Head: Normocephalic, without obvious abnormality, atraumatic Eyes: conjunctivae/corneas clear. PERRL, EOM's intact. . Throat: lips, mucosa, and tongue normal; teeth and gums normal Neck: no adenopathy, no carotid bruit, no JVD, supple, symmetrical, trachea midline and thyroid not enlarged, symmetric, no tenderness/mass/nodules Back: symmetric, no curvature. ROM normal. No CVA tenderness. Resp: clear to auscultation bilaterally Cardio: S1-S2 is irregularly irregular. No S3, S4. No rubs, murmurs, or bruit. No pedal edema.  Chest wall, was slightly tender to palpation on the left side. However, it did not reproduce his symptoms GI: soft, non-tender; bowel sounds normal; no masses,  no organomegaly Pulses: 2+ and symmetric Skin: Skin color, texture, turgor normal. No rashes or lesions Lymph nodes: Cervical, supraclavicular, and axillary nodes normal. Neurologic: He is alert and oriented x3. No focal neurological deficits are present.  Laboratory Data: Results for orders placed during the hospital encounter of 08/29/13 (from the past 48 hour(s))  CBC WITH DIFFERENTIAL     Status: None   Collection Time    08/29/13 10:42 PM      Result Value Range   WBC 8.3  4.0 - 10.5 K/uL   RBC 4.87  4.22 - 5.81 MIL/uL   Hemoglobin 13.4  13.0 - 17.0 g/dL   HCT 39.5  39.0 - 52.0 %   MCV 81.1  78.0 - 100.0 fL   MCH 27.5  26.0 - 34.0 pg   MCHC 33.9  30.0 - 36.0 g/dL   RDW 13.4  11.5 - 15.5 %   Platelets 211  150 - 400 K/uL   Neutrophils Relative % 63  43 - 77 %   Neutro Abs 5.2  1.7 - 7.7 K/uL   Lymphocytes Relative 28  12 - 46 %   Lymphs Abs 2.4  0.7 - 4.0 K/uL   Monocytes Relative 6  3 - 12 %   Monocytes Absolute 0.5  0.1 - 1.0 K/uL   Eosinophils Relative 2  0 - 5 %   Eosinophils Absolute 0.2  0.0 - 0.7 K/uL   Basophils Relative 1  0 - 1 %   Basophils Absolute 0.0  0.0 - 0.1 K/uL  BASIC METABOLIC PANEL     Status: Abnormal   Collection Time    08/29/13 10:42 PM      Result Value Range   Sodium 138  137 - 147 mEq/L   Comment: Please note change in reference range.   Potassium 3.9  3.7 - 5.3 mEq/L   Comment: Please note change in reference range.   Chloride 98  96 - 112 mEq/L   CO2 25  19 - 32 mEq/L   Glucose, Bld 202 (*) 70 - 99 mg/dL   BUN 15  6 - 23 mg/dL   Creatinine, Ser 0.78  0.50 - 1.35 mg/dL   Calcium 9.1  8.4 - 10.5 mg/dL   GFR calc non Af Amer >90  >90 mL/min   GFR calc Af Amer >90  >90 mL/min   Comment: (NOTE)     The eGFR has been calculated using the CKD EPI equation.     This calculation has  not been validated in all clinical situations.     eGFR's persistently <90 mL/min signify possible Chronic Kidney     Disease.  TROPONIN I     Status: None   Collection Time    08/29/13 10:42 PM      Result Value Range   Troponin I <0.30  <0.30 ng/mL   Comment:            Due to the release kinetics of cTnI,     a negative result within the first hours     of the onset of symptoms does not rule out     myocardial infarction with certainty.     If myocardial infarction is still suspected,     repeat the test at appropriate intervals.  PROTIME-INR     Status: None   Collection Time    08/29/13 10:42 PM      Result Value Range   Prothrombin Time 12.2  11.6 - 15.2 seconds   INR 0.92  0.00 - 1.49  DIGOXIN LEVEL     Status: None   Collection Time    08/29/13 10:53 PM      Result Value Range   Digoxin Level 0.8  0.8 - 2.0 ng/mL    Radiology Reports: Dg Chest Port 1 View  08/30/2013   CLINICAL DATA:  Chest pain for the past 2 days. History of atrial fibrillation.  EXAM: PORTABLE CHEST - 1 VIEW  COMPARISON:  Chest x-ray 12/19/2012.  FINDINGS: Diffuse peribronchial cuffing. No acute consolidative airspace disease. No pleural effusions. Cephalization of the pulmonary vasculature, without frank pulmonary edema. Mild cardiomegaly. Upper mediastinal contours are within normal limits.  IMPRESSION: 1. Diffuse peribronchial cuffing suggestive of acute bronchitis. 2. Cardiomegaly with pulmonary venous congestion, but no frank pulmonary edema.   Electronically Signed   By: Vinnie Langton M.D.   On: 08/30/2013 00:34    Electrocardiogram: EKG shows  atrial fibrillation at 78 beats per minute. There are some flutter waves. Normal axis. Intervals are normal. No Q waves. No concerning ST or T-wave changes are noted  Problem List  Principal Problem:   Chest pain Active Problems:   Atrial fibrillation   Arteriosclerotic cardiovascular disease (ASCVD)   Tobacco abuse, in remission   Obstructive sleep  apnea   Diabetes mellitus   Assessment: This is a 58 year old, Caucasian male, with a past medical history as stated earlier, who presents with on and off chest pain since yesterday. Pain is quite atypical for cardiac etiology. Actually, it is more suggestive of a GI etiology considering his burping and belching. Pain is also not characteristic of a thromboembolic event. But he does have a history of atrial fibrillation and merits observation in the hospital.  Plan: #1 chest pain in the setting of atrial fibrillation: He will be observed overnight. Serial troponin will be obtained. EKG will be repeated in the morning. He'll be given GI cocktail and PPI. Cardiology will be consulted. He'll be kept n.p.o. in case they want to do a stress test. LFTs will be checked. He does not have any epigastric tenderness.  #2 atrial fibrillation on rate control medications: This will be continued. Aspirin will be continued. He's not on anticoagulation, presumably due to low CHADS2 score. Digoxin level was normal.  #3 history of hypertension: Monitor blood pressure closely.  #4 history of diabetes mellitus, type II: He'll be placed on sliding scale coverage. HbA1c will be obtained.  #5 history of obstructive sleep apnea: Continue with CPAP at night.   DVT Prophylaxis: Lovenox Code Status: Full code Family Communication: Discussed with the patient and his wife  Disposition Plan: Observe to telemetry   Further management decisions will depend on results of further testing and patient's response to treatment.  [facility]  [facility] Pager [phone]  If 7PM-7AM, please contact night-coverage [email or web address] Password [email or web address]  08/30/2013, 1:03 AM

## 2013-08-30 NOTE — Progress Notes (Signed)
Utilization Review Complete  

## 2013-09-03 ENCOUNTER — Encounter (HOSPITAL_COMMUNITY)
Admission: RE | Admit: 2013-09-03 | Discharge: 2013-09-03 | Disposition: A | Discharge status: Home / Self Care | Payer: 59 | Source: Ambulatory Visit | Attending: Cardiovascular Disease | Admitting: Cardiovascular Disease

## 2013-09-03 ENCOUNTER — Telehealth: Payer: Self-pay

## 2013-09-03 ENCOUNTER — Encounter (HOSPITAL_COMMUNITY): Payer: Self-pay

## 2013-09-03 ENCOUNTER — Other Ambulatory Visit: Payer: Self-pay | Admitting: Cardiovascular Disease

## 2013-09-03 DIAGNOSIS — I2510 Atherosclerotic heart disease of native coronary artery without angina pectoris: Secondary | ICD-10-CM

## 2013-09-03 DIAGNOSIS — R079 Chest pain, unspecified: Secondary | ICD-10-CM

## 2013-09-03 DIAGNOSIS — R0789 Other chest pain: Secondary | ICD-10-CM | POA: Insufficient documentation

## 2013-09-03 DIAGNOSIS — I4891 Unspecified atrial fibrillation: Secondary | ICD-10-CM

## 2013-09-03 HISTORY — DX: Heart failure, unspecified: I50.9

## 2013-09-03 MED ORDER — TECHNETIUM TC 99M SESTAMIBI - CARDIOLITE
30.0000 | Freq: Once | INTRAVENOUS | Status: AC | PRN
  Administered 2013-09-03: 10:00:00 30 via INTRAVENOUS

## 2013-09-03 MED ORDER — REGADENOSON 0.4 MG/5ML IV SOLN
INTRAVENOUS | Status: AC
  Administered 2013-09-03: 0.4 mg via INTRAVENOUS
  Filled 2013-09-03: qty 5

## 2013-09-03 MED ORDER — SODIUM CHLORIDE 0.9 % IJ SOLN
INTRAMUSCULAR | Status: AC
  Administered 2013-09-03: 10 mL via INTRAVENOUS
  Filled 2013-09-03: qty 10

## 2013-09-03 MED ORDER — AZITHROMYCIN 500 MG PO TABS: ORAL_TABLET | ORAL | Status: DC

## 2013-09-03 MED ORDER — TECHNETIUM TC 99M SESTAMIBI GENERIC - CARDIOLITE
10.0000 | Freq: Once | INTRAVENOUS | Status: AC | PRN
  Administered 2013-09-03: 10 via INTRAVENOUS

## 2013-09-03 NOTE — Telephone Encounter (Signed)
Clarified with Scot pharm d at [facility] dosing for z-pack   rx to be filled as prescribed

## 2013-09-03 NOTE — Telephone Encounter (Signed)
Verbal per K.lawrenc NP, z-pack to [facility] for bronchitis

## 2013-09-03 NOTE — Progress Notes (Signed)
Stress Lab Nurses Notes - Thang Flett [place] 09/03/2013 Reason for doing test: CAD and Chest Pain Type of test: Wille Glaser Nurse performing test: Gerrit Halls, RN Nuclear Medicine Tech: Dyanne Carrel Echo Tech: Not Applicable MD performing test: Dr. Lynann Beaver.Lawrence NP Family MD: Dr. Wolfgang Phoenix Test explained and consent signed: yes IV started: 22g jelco, Saline lock flushed, No redness or edema and Saline lock started in radiology Symptoms: Chest pain Treatment/Intervention: None Reason test stopped: protocol completed After recovery IV was: Discontinued via X-ray tech and No redness or edema Patient to return to [place]. Med at : 10:45 Patient discharged: Home Patient's Condition upon discharge was: stable Comments: During stress test BP 137/83 & HR 96.  Recovery BP 126/83 & HR 76.  Symptoms resolved in recovery. Geanie Cooley T

## 2013-09-05 ENCOUNTER — Ambulatory Visit (HOSPITAL_COMMUNITY)
Admit: 2013-09-05 | Discharge: 2013-09-05 | Disposition: A | Discharge status: Home / Self Care | Payer: 59 | Source: Ambulatory Visit | Attending: Cardiology | Admitting: Cardiology

## 2013-09-05 DIAGNOSIS — I10 Essential (primary) hypertension: Secondary | ICD-10-CM | POA: Insufficient documentation

## 2013-09-05 DIAGNOSIS — I4891 Unspecified atrial fibrillation: Secondary | ICD-10-CM | POA: Insufficient documentation

## 2013-09-05 DIAGNOSIS — E785 Hyperlipidemia, unspecified: Secondary | ICD-10-CM | POA: Insufficient documentation

## 2013-09-05 DIAGNOSIS — I2510 Atherosclerotic heart disease of native coronary artery without angina pectoris: Secondary | ICD-10-CM | POA: Insufficient documentation

## 2013-09-05 DIAGNOSIS — Z6834 Body mass index (BMI) 34.0-34.9, adult: Secondary | ICD-10-CM | POA: Insufficient documentation

## 2013-09-05 DIAGNOSIS — I517 Cardiomegaly: Secondary | ICD-10-CM

## 2013-09-05 DIAGNOSIS — Z87891 Personal history of nicotine dependence: Secondary | ICD-10-CM | POA: Insufficient documentation

## 2013-09-05 DIAGNOSIS — E119 Type 2 diabetes mellitus without complications: Secondary | ICD-10-CM | POA: Insufficient documentation

## 2013-09-05 NOTE — Progress Notes (Signed)
*  PRELIMINARY RESULTS* Echocardiogram 2D Echocardiogram has been performed.  [place], [place] 09/05/2013, 2:19 PM

## 2013-09-13 ENCOUNTER — Other Ambulatory Visit: Payer: Self-pay | Admitting: Family Medicine

## 2013-09-13 ENCOUNTER — Other Ambulatory Visit: Payer: Self-pay | Admitting: Cardiology

## 2013-09-17 ENCOUNTER — Ambulatory Visit (INDEPENDENT_AMBULATORY_CARE_PROVIDER_SITE_OTHER): Payer: 59 | Admitting: Cardiology

## 2013-09-17 ENCOUNTER — Encounter: Payer: Self-pay | Admitting: Cardiology

## 2013-09-17 VITALS — BP 143/78 | HR 90 | Ht 68.0 in | Wt 229.0 lb

## 2013-09-17 DIAGNOSIS — I2510 Atherosclerotic heart disease of native coronary artery without angina pectoris: Secondary | ICD-10-CM

## 2013-09-17 DIAGNOSIS — E785 Hyperlipidemia, unspecified: Secondary | ICD-10-CM

## 2013-09-17 DIAGNOSIS — I10 Essential (primary) hypertension: Secondary | ICD-10-CM

## 2013-09-17 DIAGNOSIS — I4891 Unspecified atrial fibrillation: Secondary | ICD-10-CM

## 2013-09-17 MED ORDER — APIXABAN 5 MG PO TABS: 5.0000 mg | ORAL_TABLET | Freq: Two times a day (BID) | ORAL | Status: DC

## 2013-09-17 MED ORDER — LISINOPRIL 5 MG PO TABS: 5.0000 mg | ORAL_TABLET | Freq: Every day | ORAL | Status: DC

## 2013-09-17 NOTE — Progress Notes (Signed)
Clinical Summary [name] is a 58 y.o.male seen today as a hopsital follow up appointment.  1. Paroxysmal afib - he has not previouslyl been on anticoag from previous cardiologist - reports palpititions, typically 2-3 times a day. Mild. + SOB. Compliant with digoxin and diltiazem. Symptoms are tolerable per his report - CHADS2Vasc score is 2, he has only been on ASA  2. CAD - recent admission with URI, also noted sharp chest pain - mild non-obstructive cath in 2000, recent MPI Jan 2015 without any ischemia - has not had any more chest pain  3. HTN - does not check regularly - compliant with meds  4. Tobacco - quit Nov 1  5. Hyperlipidemia 02/2013 TC 134 TG 191 HDL 26 LDL 70 - compliant with statin Past Medical History  Diagnosis Date  . Paroxysmal atrial fibrillation 10/2004    Onset in 10/2004; recurred 09/2008  . Arteriosclerotic cardiovascular disease (ASCVD)     Nonobstructive; cath in 2000: 30-40% mid LAD and proximal RCA;normal EF. stress nuclear in 2006-subtle inferoseptal hypoperfusion with reversibility; negative stress EKG; good exercise tolerance  . Hypertension   . Sinusitis   . Insomnia   . Obstructive sleep apnea 12/2009    01/26/2010 AHI 83/hr  . Diabetes mellitus without complication   . PUD (peptic ulcer disease)   . Hyperlipidemia   . RLS (restless legs syndrome)   . Asthma   . CHF (congestive heart failure)      No Known Allergies   Current Outpatient Prescriptions  Medication Sig Dispense Refill  . albuterol-ipratropium (COMBIVENT) 18-103 MCG/ACT inhaler Inhale 2 puffs into the lungs every 6 (six) hours as needed.  14.7 g  5  . aspirin 81 MG tablet Take 81 mg by mouth every morning.       Marland Kitchen azithromycin (ZITHROMAX) 500 MG tablet 500 mg po bid  Day 1, then 500 mg daily  6 tablet  0  . chlorthalidone (HYGROTON) 25 MG tablet Take 1 tablet (25 mg total) by mouth as needed (fluid retention).  30 tablet  2  . DIGOX 250 MCG tablet TAKE 1 TABLET  BY MOUTH EVERY MORNING  30 tablet  3  . digoxin (LANOXIN) 0.25 MG tablet Take 0.25 mg by mouth daily.      Marland Kitchen diltiazem (TIAZAC) 360 MG 24 hr capsule Take 360 mg by mouth daily.      . metFORMIN (GLUCOPHAGE) 500 MG tablet Take 500 mg by mouth 2 (two) times daily.      . metFORMIN (GLUCOPHAGE) 500 MG tablet TAKE 1 TABLET BY MOUTH TWICE DAILY  60 tablet  0  . Multiple Vitamins-Minerals (MULTIVITAMIN WITH MINERALS) tablet Take 1 tablet by mouth daily.        . Omega-3 Fatty Acids (FISH OIL PO) Take 1 tablet by mouth daily.      Marland Kitchen omeprazole (PRILOSEC) 20 MG capsule Take 20 mg by mouth daily as needed (Acid Reflux).      . potassium chloride SA (K-DUR,KLOR-CON) 20 MEQ tablet Take 20 mEq by mouth as needed (Takes with chlorthalidone).       . pravastatin (PRAVACHOL) 20 MG tablet TAKE 1 TABLET BY MOUTH ONCE DAILY  30 tablet  3  . rOPINIRole (REQUIP) 2 MG tablet Take 2 mg by mouth daily.      . sodium chloride (OCEAN) 0.65 % SOLN nasal spray Place 1 spray into both nostrils as needed for congestion.      . [DISCONTINUED] diltiazem (CARDIZEM CD)  360 MG 24 hr capsule Take 1 capsule (360 mg total) by mouth every evening.  30 capsule  6   No current facility-administered medications for this visit.     Past Surgical History  Procedure Laterality Date  . Hernia repair  1986    Left inguinal  . Rotator cuff repair      Right     No Known Allergies    Family History  Problem Relation Age of Onset  . Hypertension Mother   . Heart attack Father   . Diabetes Brother      Social History Shaun [name] reports that he quit smoking about 2 months ago. His smoking use included Cigarettes. He has a 32 pack-year smoking history. He has never used smokeless tobacco. Shaun [name] reports that he does not drink alcohol.   Review of Systems CONSTITUTIONAL: No weight loss, fever, chills, weakness or fatigue.  HEENT: Eyes: No visual loss, blurred vision, double vision or yellow sclerae.No hearing loss,  sneezing, congestion, runny nose or sore throat.  SKIN: No rash or itching.  CARDIOVASCULAR: per HPI RESPIRATORY: No shortness of breath, cough or sputum.  GASTROINTESTINAL: No anorexia, nausea, vomiting or diarrhea. No abdominal pain or blood.  GENITOURINARY: No burning on urination, no polyuria NEUROLOGICAL: No headache, dizziness, syncope, paralysis, ataxia, numbness or tingling in the extremities. No change in bowel or bladder control.  MUSCULOSKELETAL: No muscle, back pain, joint pain or stiffness.  LYMPHATICS: No enlarged nodes. No history of splenectomy.  PSYCHIATRIC: No history of depression or anxiety.  ENDOCRINOLOGIC: No reports of sweating, cold or heat intolerance. No polyuria or polydipsia.  Marland Kitchen   Physical Examination p 90 bp 150/90 Wt 229 lbs BMI 35 Gen: resting comfortably, no acute distress HEENT: no scleral icterus, pupils equal round and reactive, no palptable cervical adenopathy,  CV Resp: Clear to auscultation bilaterally GI: abdomen is soft, non-tender, non-distended, normal bowel sounds, no hepatosplenomegaly MSK: extremities are warm, no edema.  Skin: warm, no rash Neuro:  no focal deficits Psych: appropriate affect   Diagnostic Studies Jan 2015 MPI  IMPRESSION: 1. Normal Lexiscan Cardiolite stress test. 2. No evidence of ischemia or scar. 3. Normal left ventricular systolic function, calculated LVEF 62%.      Assessment and Plan  1. Paroxysmal afib - reports palpitations at times, discussed adding beta blocker however he reports symptoms are tolerable at this point and elects to hold off - talked in detail about risk of stroke and afib, the risk and benefits of ASA vs anticoagulation. His CHADs2Vasc score is 2. Will start eliquis 5mg  bid  2. HTN - above goal, in setting of diabetes goal bp <130/80. Will start lisnopril 5mg  daily with BMET in 4 weeks.   3. Hyperlipidemia - at goal, continue current statin. Consider changing to high dose statin  (atorva 80 or crestor 20) at future visits per newest lipid gudinelines.  4. CAD - no current symptoms, prevoiusly had atypical chest pain. Recent negative MPI for ischemia - continue risk factor modification, continued ASA with anticoagulation is debated with no clear consensus . I elect to continue ASA for now.    Arnoldo Lenis, M.D., F.A.C.C.

## 2013-09-17 NOTE — Patient Instructions (Addendum)
Your physician recommends that you schedule a follow-up appointment in: 3 MONTHS WITH Dr Harl Bowie AND ONE MONTH WITH LISA REID  Your physician recommends that you return for lab work in: [place], YOU WILL NOT NEED TO BE FASTING  Your physician has recommended you make the following change in your medication:   1) START LISINOPRIL 5MG  ONCE DAILY 2) START ELIQUIS 5MG  TWICE DAILY 3) CONTINUE TO TAKE YOUR ASPIRIN 81MG  ONCE DAILY

## 2013-10-15 ENCOUNTER — Other Ambulatory Visit: Payer: Self-pay | Admitting: Family Medicine

## 2013-10-17 ENCOUNTER — Ambulatory Visit (INDEPENDENT_AMBULATORY_CARE_PROVIDER_SITE_OTHER): Payer: 59 | Admitting: *Deleted

## 2013-10-17 DIAGNOSIS — Z5181 Encounter for therapeutic drug level monitoring: Secondary | ICD-10-CM

## 2013-10-17 DIAGNOSIS — I4891 Unspecified atrial fibrillation: Secondary | ICD-10-CM

## 2013-10-17 NOTE — Patient Instructions (Signed)
1 month Eliquis follow up  Labs 08/29/13 Prior to starting Eliquis:  SrCr0.78 / Hbg13.4 / Hct 39.5 / CrCl 153.52  Pt was started on Eliquis for atrial fib on 09/17/13 by Dr Harl Bowie.  Reviewed patients medication list.  Pt is not currently on any combined P-gp and strong CYP3A4 inhibitors/inducers (ketoconazole, traconazole, ritonavir, carbamazepine, phenytoin, rifampin, St. John's wort).  Reviewed labs from 09/20/13.  SCr , Weight 103.9kg, CrCl-   Dose is apropriate based on CrCl.   Hgb and HCT   A full discussion of the nature of anticoagulants has been carried out.  A benefit/risk analysis has been presented to the patient, so that they understand the justification for choosing anticoagulation with Eliquis at this time.  The need for compliance is stressed.  Pt is aware to take the medication twice daily.  Side effects of potential bleeding are discussed, including unusual colored urine or stools, coughing up blood or coffee ground emesis, nose bleeds or serious fall or head trauma.  Discussed signs and symptoms of stroke. The patient should avoid any OTC items containing aspirin or ibuprofen.  Avoid alcohol consumption.   Call if any signs of abnormal bleeding.  Discussed financial obligations and resolved any difficulty in obtaining medication.  Next lab test test in 6 months.

## 2013-10-23 ENCOUNTER — Other Ambulatory Visit: Payer: Self-pay | Admitting: Family Medicine

## 2013-11-20 ENCOUNTER — Ambulatory Visit (INDEPENDENT_AMBULATORY_CARE_PROVIDER_SITE_OTHER): Payer: 59 | Admitting: Family Medicine

## 2013-11-20 ENCOUNTER — Encounter: Payer: Self-pay | Admitting: Family Medicine

## 2013-11-20 VITALS — BP 122/74 | Ht 68.0 in | Wt 228.0 lb

## 2013-11-20 DIAGNOSIS — E119 Type 2 diabetes mellitus without complications: Secondary | ICD-10-CM

## 2013-11-20 DIAGNOSIS — Z125 Encounter for screening for malignant neoplasm of prostate: Secondary | ICD-10-CM

## 2013-11-20 DIAGNOSIS — Z79899 Other long term (current) drug therapy: Secondary | ICD-10-CM

## 2013-11-20 DIAGNOSIS — E785 Hyperlipidemia, unspecified: Secondary | ICD-10-CM

## 2013-11-20 DIAGNOSIS — I10 Essential (primary) hypertension: Secondary | ICD-10-CM

## 2013-11-20 DIAGNOSIS — I4891 Unspecified atrial fibrillation: Secondary | ICD-10-CM

## 2013-11-20 LAB — POCT GLYCOSYLATED HEMOGLOBIN (HGB A1C): HEMOGLOBIN A1C: 8.1

## 2013-11-20 MED ORDER — METFORMIN HCL 500 MG PO TABS: 1000.0000 mg | ORAL_TABLET | Freq: Two times a day (BID) | ORAL | Status: DC

## 2013-11-20 NOTE — Progress Notes (Signed)
   Subjective:    Patient ID: Shaun [name], male    DOB: Aug 02, 1956, 58 y.o.   MRN: [id number]  HPI Comments: Pt c/o of not sleeping well. He works nights now.   Diabetes He presents for his follow-up diabetic visit. He has type 2 diabetes mellitus. Hypoglycemia symptoms include sweats. There are no diabetic associated symptoms. Current diabetic treatment includes oral agent (monotherapy). He is compliant with treatment all of the time. He participates in exercise daily. He monitors blood glucose at home 1-2 x per day. Blood glucose monitoring compliance is excellent. His home blood glucose trend is increasing steadily. His breakfast blood glucose range is generally 130-140 mg/dl.   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 No obvious side effects. Taking faithfully. Admits to not watching his diet the best.  On medicine for blood pressure. Handling it well. No obvious side effects.  Sees a specialist for rapid heart rate. Associated with atrial fibrillation. Currently stable. On blood thinning medicine.  Review of Systems No chest pain no headache no back pain no abdominal pain no change in bowel habits no blood in stool    Objective:   Physical Exam Alert no apparent distress. HEENT normal. Lungs clear. Heart regular rate and rhythm. Ankles without edema. C. foot exam.       Assessment & Plan:  Impression #1 type 2 diabetes control suboptimal in discussed. Need to increase medication. #2 hypertension good control. #3 hyperlipidemia status uncertain. #4 obstructive sleep apnea ongoing plan increase metformin to 502 twice a day. Start  exercising regularly. Diet discussed. Blood work. Further recommendations based results. Recheck in several months. For a wellness exam. WSL

## 2013-11-22 ENCOUNTER — Other Ambulatory Visit: Payer: Self-pay | Admitting: Family Medicine

## 2013-12-13 ENCOUNTER — Other Ambulatory Visit: Payer: Self-pay | Admitting: Family Medicine

## 2014-01-13 ENCOUNTER — Other Ambulatory Visit: Payer: Self-pay | Admitting: Adult Health

## 2014-02-11 ENCOUNTER — Other Ambulatory Visit: Payer: Self-pay | Admitting: Adult Health

## 2014-02-19 ENCOUNTER — Ambulatory Visit (INDEPENDENT_AMBULATORY_CARE_PROVIDER_SITE_OTHER): Payer: 59 | Admitting: Family Medicine

## 2014-02-19 ENCOUNTER — Encounter: Payer: Self-pay | Admitting: Family Medicine

## 2014-02-19 VITALS — BP 130/72 | Temp 98.3°F | Ht 68.0 in | Wt 230.0 lb

## 2014-02-19 DIAGNOSIS — M542 Cervicalgia: Secondary | ICD-10-CM

## 2014-02-19 MED ORDER — HYDROCODONE-ACETAMINOPHEN 5-325 MG PO TABS: ORAL_TABLET | ORAL | Status: DC

## 2014-02-19 MED ORDER — CHLORZOXAZONE 500 MG PO TABS: 500.0000 mg | ORAL_TABLET | Freq: Three times a day (TID) | ORAL | Status: DC | PRN

## 2014-02-19 NOTE — Progress Notes (Signed)
   Subjective:    Patient ID: Shaun [name], male    DOB: 1956-04-17, 58 y.o.   MRN: [id number]  Neck Pain  This is a new problem. The current episode started today. The problem occurs constantly. The problem has been gradually worsening. The quality of the pain is described as aching and burning. The pain is at a severity of 7/10. The pain is moderate. The symptoms are aggravated by position. The pain is worse during the night. Stiffness is present at night. He has tried acetaminophen for the symptoms. The treatment provided no relief.   Patient states he has no other concerns at this time.  Recalls no injury. No loss of strength.  Review of Systems  Musculoskeletal: Positive for neck pain.   No chest pain no abdominal pain no change in bowel habits ROS otherwise negative    Objective:   Physical Exam Alert no apparent distress. Lungs clear heart rare rhythm. Pain right posterior neck to palpation. Pain with rotation. Supraclavicular and supra-scapular tenderness to deep palpation. Shoulder decent range of motion. Lungs clear heart regular in rhythm.       Assessment & Plan:  Impression para cervical strain plan local measures discussed. Hydrocodone when necessary. Chlorzoxazone 3 times a day. Avoid anti-inflammatories 22 blood thinning medicine WSL

## 2014-02-24 ENCOUNTER — Encounter: Payer: Self-pay | Admitting: Family Medicine

## 2014-02-24 ENCOUNTER — Ambulatory Visit (INDEPENDENT_AMBULATORY_CARE_PROVIDER_SITE_OTHER): Payer: 59 | Admitting: Family Medicine

## 2014-02-24 VITALS — BP 128/82 | Ht 68.0 in | Wt 224.8 lb

## 2014-02-24 DIAGNOSIS — Z79899 Other long term (current) drug therapy: Secondary | ICD-10-CM

## 2014-02-24 DIAGNOSIS — Z125 Encounter for screening for malignant neoplasm of prostate: Secondary | ICD-10-CM

## 2014-02-24 DIAGNOSIS — Z23 Encounter for immunization: Secondary | ICD-10-CM

## 2014-02-24 DIAGNOSIS — E782 Mixed hyperlipidemia: Secondary | ICD-10-CM

## 2014-02-24 DIAGNOSIS — Z0000 Encounter for general adult medical examination without abnormal findings: Secondary | ICD-10-CM

## 2014-02-24 DIAGNOSIS — E119 Type 2 diabetes mellitus without complications: Secondary | ICD-10-CM

## 2014-02-24 MED ORDER — TRAZODONE HCL 50 MG PO TABS: ORAL_TABLET | ORAL | Status: DC

## 2014-02-24 NOTE — Progress Notes (Signed)
   Subjective:    Patient ID: Shaun [name], male    DOB: 12/09/1955, 58 y.o.   MRN: [id number]  HPI Patient arrives for his routine wellness exam. Patient reports no problems or concerns.  Results for orders placed in visit on 11/20/13  POCT GLYCOSYLATED HEMOGLOBIN (HGB A1C)      Result Value Ref Range   Hemoglobin A1C 8.1    morning sugars mid 90sd tp 165  Reg exercise overall pretty good  Watches diet well  Diet plus minus not so good at work, ht and miss and junk food there Eye doc--ck up no diabetes damage  No hx of recent tet shot  Missed med rarely once out of a month or so  Notes a lot of difficulty sleeping. Also diminished energy. Also diminished reviewed. A lot of stress. Took trazodone in the past which helped considerably.  Review of Systems  Constitutional: Negative for fever, activity change and appetite change.  HENT: Negative for congestion and rhinorrhea.   Eyes: Negative for discharge.  Respiratory: Negative for cough and wheezing.   Cardiovascular: Negative for chest pain.  Gastrointestinal: Negative for vomiting, abdominal pain and blood in stool.  Genitourinary: Negative for frequency and difficulty urinating.  Musculoskeletal: Negative for neck pain.  Skin: Negative for rash.  Allergic/Immunologic: Negative for environmental allergies and food allergies.  Neurological: Negative for weakness and headaches.  Psychiatric/Behavioral: Negative for agitation.  All other systems reviewed and are negative.      Objective:   Physical Exam  Constitutional: He appears well-developed and well-nourished.  HENT:  Head: Normocephalic and atraumatic.  Right Ear: External ear normal.  Left Ear: External ear normal.  Nose: Nose normal.  Mouth/Throat: Oropharynx is clear and moist.  Eyes: EOM are normal. Pupils are equal, round, and reactive to light.  Neck: Normal range of motion. Neck supple. No thyromegaly present.  Cardiovascular: Normal rate,  regular rhythm and normal heart sounds.   No murmur heard. Pulmonary/Chest: Effort normal and breath sounds normal. No respiratory distress. He has no wheezes.  Abdominal: Soft. Bowel sounds are normal. He exhibits no distension and no mass. There is no tenderness.  Genitourinary: Penis normal.  Prostate within normal limits  Musculoskeletal: Normal range of motion. He exhibits no edema.  Lymphadenopathy:    He has no cervical adenopathy.  Neurological: He is alert. He exhibits normal muscle tone.  Skin: Skin is warm and dry. No erythema.  Psychiatric: He has a normal mood and affect. His behavior is normal. Judgment normal.          Assessment & Plan:  Impression 1 wellness exam #2 type 2 diabetes. #3 insomnia. #4 hypertension. #5 atrial fibrillation. Plan colonoscopy sheet given and strongly encouraged patient states we'll do. Tetanus vaccination. Diet exercise discussed. Medications refilled. Recheck in 4 months. Initiate trazodone to assist sleep WSL

## 2014-03-12 LAB — BASIC METABOLIC PANEL
BUN: 22 mg/dL (ref 6–23)
CHLORIDE: 102 meq/L (ref 96–112)
CO2: 24 mEq/L (ref 19–32)
Calcium: 9 mg/dL (ref 8.4–10.5)
Creat: 0.81 mg/dL (ref 0.50–1.35)
Glucose, Bld: 137 mg/dL — ABNORMAL HIGH (ref 70–99)
Potassium: 4.2 mEq/L (ref 3.5–5.3)
SODIUM: 137 meq/L (ref 135–145)

## 2014-03-12 LAB — HEPATIC FUNCTION PANEL
ALBUMIN: 4.2 g/dL (ref 3.5–5.2)
ALK PHOS: 50 U/L (ref 39–117)
ALT: 31 U/L (ref 0–53)
AST: 24 U/L (ref 0–37)
BILIRUBIN TOTAL: 0.4 mg/dL (ref 0.2–1.2)
Bilirubin, Direct: 0.1 mg/dL (ref 0.0–0.3)
Indirect Bilirubin: 0.3 mg/dL (ref 0.2–1.2)
Total Protein: 6.4 g/dL (ref 6.0–8.3)

## 2014-03-12 LAB — LIPID PANEL
CHOL/HDL RATIO: 4.3 ratio
Cholesterol: 125 mg/dL (ref 0–200)
HDL: 29 mg/dL — AB (ref >39)
LDL CALC: 58 mg/dL (ref 0–99)
Triglycerides: 192 mg/dL — ABNORMAL HIGH (ref <150)
VLDL: 38 mg/dL (ref 0–40)

## 2014-03-12 LAB — HEMOGLOBIN A1C
Hgb A1c MFr Bld: 7.2 % — ABNORMAL HIGH (ref <5.7)
Mean Plasma Glucose: 160 mg/dL — ABNORMAL HIGH (ref <117)

## 2014-03-13 LAB — PSA: PSA: 1.08 ng/mL (ref <=4.00)

## 2014-03-13 LAB — MICROALBUMIN, URINE: MICROALB UR: 1.33 mg/dL (ref 0.00–1.89)

## 2014-03-17 ENCOUNTER — Encounter: Payer: Self-pay | Admitting: Family Medicine

## 2014-03-30 LAB — HM DIABETES EYE EXAM

## 2014-04-03 ENCOUNTER — Emergency Department (HOSPITAL_COMMUNITY)
Admission: EM | Admit: 2014-04-03 | Discharge: 2014-04-03 | Disposition: A | Discharge status: Home / Self Care | Payer: 59 | Attending: Emergency Medicine | Admitting: Emergency Medicine

## 2014-04-03 ENCOUNTER — Encounter (HOSPITAL_COMMUNITY): Payer: Self-pay | Admitting: Emergency Medicine

## 2014-04-03 DIAGNOSIS — E119 Type 2 diabetes mellitus without complications: Secondary | ICD-10-CM | POA: Insufficient documentation

## 2014-04-03 DIAGNOSIS — K029 Dental caries, unspecified: Secondary | ICD-10-CM | POA: Diagnosis not present

## 2014-04-03 DIAGNOSIS — J45909 Unspecified asthma, uncomplicated: Secondary | ICD-10-CM | POA: Diagnosis not present

## 2014-04-03 DIAGNOSIS — I10 Essential (primary) hypertension: Secondary | ICD-10-CM | POA: Diagnosis not present

## 2014-04-03 DIAGNOSIS — Z8669 Personal history of other diseases of the nervous system and sense organs: Secondary | ICD-10-CM | POA: Diagnosis not present

## 2014-04-03 DIAGNOSIS — R220 Localized swelling, mass and lump, head: Secondary | ICD-10-CM | POA: Insufficient documentation

## 2014-04-03 DIAGNOSIS — Z79899 Other long term (current) drug therapy: Secondary | ICD-10-CM | POA: Diagnosis not present

## 2014-04-03 DIAGNOSIS — Z7982 Long term (current) use of aspirin: Secondary | ICD-10-CM | POA: Insufficient documentation

## 2014-04-03 DIAGNOSIS — R221 Localized swelling, mass and lump, neck: Secondary | ICD-10-CM

## 2014-04-03 DIAGNOSIS — Z87891 Personal history of nicotine dependence: Secondary | ICD-10-CM | POA: Diagnosis not present

## 2014-04-03 DIAGNOSIS — K279 Peptic ulcer, site unspecified, unspecified as acute or chronic, without hemorrhage or perforation: Secondary | ICD-10-CM | POA: Insufficient documentation

## 2014-04-03 DIAGNOSIS — I509 Heart failure, unspecified: Secondary | ICD-10-CM | POA: Diagnosis not present

## 2014-04-03 DIAGNOSIS — E785 Hyperlipidemia, unspecified: Secondary | ICD-10-CM | POA: Diagnosis not present

## 2014-04-03 MED ORDER — ONDANSETRON 4 MG PO TBDP
4.0000 mg | ORAL_TABLET | Freq: Once | ORAL | Status: AC
  Administered 2014-04-03: 4 mg via ORAL
  Filled 2014-04-03: qty 1

## 2014-04-03 MED ORDER — CLINDAMYCIN HCL 150 MG PO CAPS: 150.0000 mg | ORAL_CAPSULE | Freq: Four times a day (QID) | ORAL | Status: DC

## 2014-04-03 MED ORDER — STERILE WATER FOR INJECTION IJ SOLN
INTRAMUSCULAR | Status: AC
  Administered 2014-04-03: 10 mL
  Filled 2014-04-03: qty 10

## 2014-04-03 MED ORDER — HYDROCODONE-ACETAMINOPHEN 7.5-325 MG PO TABS
1.0000 | ORAL_TABLET | ORAL | Status: DC | PRN
Start: 2014-04-03 — End: 2014-04-29

## 2014-04-03 MED ORDER — MORPHINE SULFATE 4 MG/ML IJ SOLN
8.0000 mg | Freq: Once | INTRAMUSCULAR | Status: AC
  Administered 2014-04-03: 8 mg via INTRAMUSCULAR
  Filled 2014-04-03: qty 2

## 2014-04-03 MED ORDER — IBUPROFEN 800 MG PO TABS: 800.0000 mg | ORAL_TABLET | Freq: Once | ORAL | Status: DC

## 2014-04-03 MED ORDER — CEFTRIAXONE SODIUM 1 G IJ SOLR
1.0000 g | Freq: Once | INTRAMUSCULAR | Status: AC
  Administered 2014-04-03: 1 g via INTRAMUSCULAR
  Filled 2014-04-03: qty 10

## 2014-04-03 NOTE — ED Provider Notes (Signed)
CSN: [id number]     Arrival date & time 04/03/14  2057 History   None    Chief Complaint  Patient presents with  . Facial Swelling     (Consider location/radiation/quality/duration/timing/severity/associated sxs/prior Treatment) HPI Comments: Patient presents to the emergency department with complaint of left-sided face pain. The patient states this is been going on for 2 or 3 days. He has a dental caries present. He states he thinks he's had some temperature elevation, but is unsure as to how high the temperature may of been. Today he noticed that only pain but swelling involving the right face. He's not had any problems with breathing or swallowing. He has not been vomiting. It is of note that he has a history of diabetes mellitus, he also has a history of atrial fibrillation.  The history is provided by the patient.    Past Medical History  Diagnosis Date  . Paroxysmal atrial fibrillation 10/2004    Onset in 10/2004; recurred 09/2008  . Arteriosclerotic cardiovascular disease (ASCVD)     Nonobstructive; cath in 2000: 30-40% mid LAD and proximal RCA;normal EF. stress nuclear in 2006-subtle inferoseptal hypoperfusion with reversibility; negative stress EKG; good exercise tolerance  . Hypertension   . Sinusitis   . Insomnia   . Obstructive sleep apnea 12/2009    01/26/2010 AHI 83/hr  . Diabetes mellitus without complication   . PUD (peptic ulcer disease)   . Hyperlipidemia   . RLS (restless legs syndrome)   . Asthma   . CHF (congestive heart failure)    Past Surgical History  Procedure Laterality Date  . Hernia repair  1986    Left inguinal  . Rotator cuff repair      Right   Family History  Problem Relation Age of Onset  . Hypertension Mother   . Heart attack Father   . Diabetes Brother    History  Substance Use Topics  . Smoking status: Former Smoker -- 1.00 packs/day for 32 years    Types: Cigarettes    Quit date: 07/04/2013  . Smokeless tobacco: Never Used  . Alcohol  Use: No    Review of Systems  Constitutional: Negative for activity change.       All ROS Neg except as noted in HPI  HENT: Positive for dental problem. Negative for nosebleeds.   Eyes: Negative for photophobia and discharge.  Respiratory: Negative for cough, shortness of breath and wheezing.   Cardiovascular: Positive for palpitations. Negative for chest pain.  Gastrointestinal: Negative for abdominal pain and blood in stool.  Genitourinary: Negative for dysuria, frequency and hematuria.  Musculoskeletal: Negative for arthralgias, back pain and neck pain.  Skin: Negative.   Neurological: Negative for dizziness, seizures and speech difficulty.  Psychiatric/Behavioral: Negative for hallucinations and confusion.      Allergies  Review of patient's allergies indicates no known allergies.  Home Medications   Prior to Admission medications   Medication Sig Start Date End Date Taking? Authorizing Provider  albuterol-ipratropium (COMBIVENT) 18-103 MCG/ACT inhaler Inhale 2 puffs into the lungs every 6 (six) hours as needed. 04/01/13   Kathyrn Drown, MD  apixaban (ELIQUIS) 5 MG TABS tablet Take 1 tablet (5 mg total) by mouth 2 (two) times daily. 09/17/13   Arnoldo Lenis, MD  aspirin 81 MG tablet Take 81 mg by mouth every morning.     Historical Provider, MD  chlorthalidone (HYGROTON) 25 MG tablet Take 1 tablet (25 mg total) by mouth as needed (fluid retention). 08/19/13  Lendon Colonel, NP  chlorzoxazone (PARAFON) 500 MG tablet Take 1 tablet (500 mg total) by mouth 3 (three) times daily as needed for muscle spasms. 02/19/14   Mikey Kirschner, MD  [place] 250 MCG tablet TAKE 1 TABLET BY MOUTH EVERY MORNING 01/13/14   Lendon Colonel, NP  diltiazem [facility]) 360 MG 24 hr capsule TAKE 1 CAPSULE BY MOUTH EVERY DAY 02/11/14   Lendon Colonel, NP  HYDROcodone-acetaminophen (NORCO/VICODIN) 5-325 MG per tablet Take 1 tablet every 4-6 hours prn pain 02/19/14   Mikey Kirschner, MD  lisinopril  (PRINIVIL,ZESTRIL) 5 MG tablet Take 1 tablet (5 mg total) by mouth daily. 09/17/13   Arnoldo Lenis, MD  metFORMIN (GLUCOPHAGE) 500 MG tablet Take 2 tablets (1,000 mg total) by mouth 2 (two) times daily with a meal. 11/20/13   Mikey Kirschner, MD  Multiple Vitamins-Minerals (MULTIVITAMIN WITH MINERALS) tablet Take 1 tablet by mouth daily.      Historical Provider, MD  Omega-3 Fatty Acids (FISH OIL PO) Take 1 tablet by mouth daily.    Historical Provider, MD  omeprazole (PRILOSEC) 20 MG capsule Take 20 mg by mouth daily as needed (Acid Reflux).    Historical Provider, MD  potassium chloride SA (K-DUR,KLOR-CON) 20 MEQ tablet Take 20 mEq by mouth as needed (Takes with chlorthalidone).     Historical Provider, MD  pravastatin (PRAVACHOL) 20 MG tablet TAKE 1 TABLET BY MOUTH EVERY DAY 01/13/14   Lendon Colonel, NP  rOPINIRole (REQUIP) 2 MG tablet TAKE 1 TABLET BY MOUTH EVERY NIGHT AT BEDTIME 12/13/13   Mikey Kirschner, MD  sodium chloride (OCEAN) 0.65 % SOLN nasal spray Place 1 spray into both nostrils as needed for congestion.    Historical Provider, MD  traZODone (DESYREL) 50 MG tablet Take one tab at bedtime for 7 days then 2 tablets at bedtime 02/24/14   Mikey Kirschner, MD   BP 157/90  Pulse 93  Temp(Src) 99.6 F (37.6 C) (Oral)  Resp 20  Ht 5\' 8"  (1.727 m)  Wt 225 lb (102.059 kg)  BMI 34.22 kg/m2  SpO2 97% Physical Exam  Nursing note and vitals reviewed. Constitutional: He is oriented to person, place, and time. He appears well-developed and well-nourished.  Non-toxic appearance.  HENT:  Head: Normocephalic.    Right Ear: Tympanic membrane and external ear normal.  Left Ear: Tympanic membrane and external ear normal.  There multiple dental caries present. There are dental caries decayed to the area below the gum line on the right. There is increased swelling of the gum. There is not visible abscess. There is no swelling under the tongue. The airway is patent. The uvula is in the  midline.  Eyes: EOM and lids are normal. Pupils are equal, round, and reactive to light.  Neck: Normal range of motion. Neck supple. Carotid bruit is not present.  There is palpable lymphadenopathy involving the submental area and the cervical chain.  Cardiovascular: Normal rate, regular rhythm, normal heart sounds, intact distal pulses and normal pulses.   The patient has an irregularly irregular rhythm at 98 beats per minute.  Pulmonary/Chest: Breath sounds normal. No respiratory distress.  Abdominal: Soft. Bowel sounds are normal. There is no tenderness. There is no guarding.  Musculoskeletal: Normal range of motion.  Lymphadenopathy:       Head (right side): No submandibular adenopathy present.       Head (left side): No submandibular adenopathy present.    He has no cervical adenopathy.  Neurological: He is alert and oriented to person, place, and time. He has normal strength. No cranial nerve deficit or sensory deficit.  Skin: Skin is warm and dry.  Psychiatric: He has a normal mood and affect. His speech is normal.    ED Course  Procedures (including critical care time) Labs Review Labs Reviewed - No data to display  Imaging Review No results found.   EKG Interpretation None      MDM Temperature is 99.6, pulse rate is 93, respiratory rate is 20, blood pressure is 157/90. The pulse oximetry is 97% on room air. Within normal limits by my interpretation. The patient has multiple upper and lower jaw dental caries. There dental caries below the gum line on the right. There is swelling of the face on the right side. There is tenderness to palpation. The patient was treated in the emergency department with intramuscular Rocephin and intramuscular morphine. A prescription for clindamycin and Norco 7.5 mg given to the patient. Patient strongly encouraged to see the dentist as sone as possible especially given his history of diabetes mellitus. The patient is to see his primary physician  or return if any temperature elevations that would not respond to Tylenol, general deterioration in his condition.    Final diagnoses:  None    **I have reviewed nursing notes, vital signs, and all appropriate lab and imaging results for this patient.Lenox Ahr, PA-C 04/03/14 2217

## 2014-04-03 NOTE — Discharge Instructions (Signed)
You have a rather severe infection involving your teeth and gums. With respect to your history of diabetes, it is very important that you see a dentist as sone as possible. You were treated tonight with intramuscular Rocephin. Please start clindamycin tomorrow with each meal and at bedtime. Please use Tylenol for mild pain, use Norco for more severe pain. Norco may cause drowsiness, please use with caution. Please see Dr.Luking, or return to the emergency department if any temperature elevations that would not respond to Tylenol, or any general deterioration in your overall condition. Dental Caries Dental caries (also called tooth decay) is the most common oral disease. It can occur at any age but is more common in children and young adults.  HOW DENTAL CARIES DEVELOPS  The process of decay begins when bacteria and foods (particularly sugars and starches) combine in your mouth to produce plaque. Plaque is a substance that sticks to the hard, outer surface of a tooth (enamel). The bacteria in plaque produce acids that attack enamel. These acids may also attack the root surface of a tooth (cementum) if it is exposed. Repeated attacks dissolve these surfaces and create holes in the tooth (cavities). If left untreated, the acids destroy the other layers of the tooth.  RISK FACTORS  Frequent sipping of sugary beverages.   Frequent snacking on sugary and starchy foods, especially those that easily get stuck in the teeth.   Poor oral hygiene.   Dry mouth.   Substance abuse such as methamphetamine abuse.   Broken or poor-fitting dental restorations.   Eating disorders.   Gastroesophageal reflux disease (GERD).   Certain radiation treatments to the head and neck. SYMPTOMS In the early stages of dental caries, symptoms are seldom present. Sometimes white, chalky areas may be seen on the enamel or other tooth layers. In later stages, symptoms may include:  Pits and holes on the  enamel.  Toothache after sweet, hot, or cold foods or drinks are consumed.  Pain around the tooth.  Swelling around the tooth. DIAGNOSIS  Most of the time, dental caries is detected during a regular dental checkup. A diagnosis is made after a thorough medical and dental history is taken and the surfaces of your teeth are checked for signs of dental caries. Sometimes special instruments, such as lasers, are used to check for dental caries. Dental X-ray exams may be taken so that areas not visible to the eye (such as between the contact areas of the teeth) can be checked for cavities.  TREATMENT  If dental caries is in its early stages, it may be reversed with a fluoride treatment or an application of a remineralizing agent at the dental office. Thorough brushing and flossing at home is needed to aid these treatments. If it is in its later stages, treatment depends on the location and extent of tooth destruction:   If a small area of the tooth has been destroyed, the destroyed area will be removed and cavities will be filled with a material such as gold, silver amalgam, or composite resin.   If a large area of the tooth has been destroyed, the destroyed area will be removed and a cap (crown) will be fitted over the remaining tooth structure.   If the center part of the tooth (pulp) is affected, a procedure called a root canal will be needed before a filling or crown can be placed.   If most of the tooth has been destroyed, the tooth may need to be pulled (extracted). HOME  CARE INSTRUCTIONS You can prevent, stop, or reverse dental caries at home by practicing good oral hygiene. Good oral hygiene includes:  Thoroughly cleaning your teeth at least twice a day with a toothbrush and dental floss.   Using a fluoride toothpaste. A fluoride mouth rinse may also be used if recommended by your dentist or health care provider.   Restricting the amount of sugary and starchy foods and sugary liquids  you consume.   Avoiding frequent snacking on these foods and sipping of these liquids.   Keeping regular visits with a dentist for checkups and cleanings. PREVENTION   Practice good oral hygiene.  Consider a dental sealant. A dental sealant is a coating material that is applied by your dentist to the pits and grooves of teeth. The sealant prevents food from being trapped in them. It may protect the teeth for several years.  Ask about fluoride supplements if you live in a community without fluorinated water or with water that has a low fluoride content. Use fluoride supplements as directed by your dentist or health care provider.  Allow fluoride varnish applications to teeth if directed by your dentist or health care provider. Document Released: 05/07/2002 Document Revised: 12/30/2013 Document Reviewed: 08/17/2012 [facility] Patient Information 2015 [place], [place]. This information is not intended to replace advice given to you by your health care provider. Make sure you discuss any questions you have with your health care provider.

## 2014-04-03 NOTE — ED Notes (Signed)
Pt c/o left sided mouth pain with facial swelling on the left side x 2 days.

## 2014-04-04 ENCOUNTER — Telehealth: Payer: Self-pay | Admitting: Family Medicine

## 2014-04-04 NOTE — Telephone Encounter (Signed)
Pt seen at ER last night for abscess tooth, they told him to get in touch with [place] to refer him to an oral surgeon, explained to pt that a referral like that may need to come from his dentist, states he don't have a dentist, please advise

## 2014-04-04 NOTE — ED Provider Notes (Signed)
Medical screening examination/treatment/procedure(s) were performed by non-physician practitioner and as supervising physician I was immediately available for consultation/collaboration.   EKG Interpretation None       Ezequiel Essex, MD 04/04/14 [phone]

## 2014-04-06 NOTE — Telephone Encounter (Signed)
Please provide this patient with a phone number for 2 to 3 dentists  in this region. Typically they can take care of this type of problem. If he needs additional antibiotics we can send these in. Dr Sheryn Bison or Dr Wallace Going would do well. If they could not handle this they would set him up with oral surgeon

## 2014-04-07 NOTE — Telephone Encounter (Signed)
Provided patient with Dr. Delcie Roch and Dr. Larrie Kass phone number. Patient will call if more antibiotics are needed.

## 2014-04-14 ENCOUNTER — Ambulatory Visit (INDEPENDENT_AMBULATORY_CARE_PROVIDER_SITE_OTHER): Payer: 59 | Admitting: *Deleted

## 2014-04-14 DIAGNOSIS — I159 Secondary hypertension, unspecified: Secondary | ICD-10-CM

## 2014-04-14 DIAGNOSIS — I4891 Unspecified atrial fibrillation: Secondary | ICD-10-CM

## 2014-04-14 DIAGNOSIS — I158 Other secondary hypertension: Secondary | ICD-10-CM

## 2014-04-14 MED ORDER — APIXABAN 5 MG PO TABS: 5.0000 mg | ORAL_TABLET | Freq: Two times a day (BID) | ORAL | Status: DC

## 2014-04-14 NOTE — Patient Instructions (Addendum)
6 month Eliquis follow up:  Pt was started on Eliquis for atrial fib on 09/17/13 by Dr Harl Bowie.   Reviewed patients medication list. Pt is not currently on any combined P-gp and strong CYP3A4 inhibitors/inducers (ketoconazole, traconazole, ritonavir, carbamazepine, phenytoin, rifampin, St. John's wort). Labs 03/12/14 : SrCr 0.81  CrCl 142.62  Wt. 221  Lab 04/14/14 :  Hbg  / Hct   Dose is apropriate based on CrCl.   A full discussion of the nature of anticoagulants has been carried out. A benefit/risk analysis has been presented to the patient, so that they understand the justification for choosing anticoagulation with Eliquis at this time. The need for compliance is stressed. Pt is aware to take the medication twice daily. Side effects of potential bleeding are discussed, including unusual colored urine or stools, coughing up blood or coffee ground emesis, nose bleeds or serious fall or head trauma. Discussed signs and symptoms of stroke. The patient should avoid any OTC items containing aspirin or ibuprofen. Avoid alcohol consumption. Call if any signs of abnormal bleeding. Discussed financial obligations and resolved any difficulty in obtaining medication. Next lab test test in 6 months.   07/14/14  Pt never went to lab for CBC.

## 2014-04-22 ENCOUNTER — Other Ambulatory Visit: Payer: Self-pay | Admitting: Family Medicine

## 2014-04-29 ENCOUNTER — Ambulatory Visit (INDEPENDENT_AMBULATORY_CARE_PROVIDER_SITE_OTHER): Payer: 59 | Admitting: Gastroenterology

## 2014-04-29 ENCOUNTER — Encounter: Payer: Self-pay | Admitting: Gastroenterology

## 2014-04-29 ENCOUNTER — Telehealth: Payer: Self-pay

## 2014-04-29 VITALS — BP 134/80 | HR 71 | Temp 98.1°F | Ht 68.0 in | Wt 227.2 lb

## 2014-04-29 DIAGNOSIS — Z1211 Encounter for screening for malignant neoplasm of colon: Secondary | ICD-10-CM

## 2014-04-29 NOTE — Telephone Encounter (Signed)
Will forward to Dr. Harl Bowie please advise

## 2014-04-29 NOTE — Progress Notes (Signed)
Primary Care Physician:  Rubbie Battiest, MD Primary Gastroenterologist:  Dr. Oneida Alar   Chief Complaint  Patient presents with  . Colonoscopy    HPI:   Shaun [name] presents today to schedule an initial screening colonoscopy. On Eliquis for afib since early 2015. No abdominal pain. No constipation, diarrhea. No rectal bleeding. No melena. Rare heartburn, takes Prilosec as needed. No dysphagia. No loss of appetite or weight loss.   Past Medical History  Diagnosis Date  . Paroxysmal atrial fibrillation 10/2004    Onset in 10/2004; recurred 09/2008  . Arteriosclerotic cardiovascular disease (ASCVD)     Nonobstructive; cath in 2000: 30-40% mid LAD and proximal RCA;normal EF. stress nuclear in 2006-subtle inferoseptal hypoperfusion with reversibility; negative stress EKG; good exercise tolerance  . Hypertension   . Sinusitis   . Insomnia   . Obstructive sleep apnea 12/2009    01/26/2010 AHI 83/hr  . Diabetes mellitus without complication   . PUD (peptic ulcer disease)     1980s  . Hyperlipidemia   . RLS (restless legs syndrome)   . Asthma   . CHF (congestive heart failure)     Past Surgical History  Procedure Laterality Date  . Hernia repair  1986    Left inguinal  . Rotator cuff repair      Right    Current Outpatient Prescriptions  Medication Sig Dispense Refill  . ADVOCATE REDI-CODE test strip use once daily as directed to test blood sugar  100 each  11  . albuterol-ipratropium (COMBIVENT) 18-103 MCG/ACT inhaler Inhale 2 puffs into the lungs every 6 (six) hours as needed.  14.7 g  5  . apixaban (ELIQUIS) 5 MG TABS tablet Take 1 tablet (5 mg total) by mouth 2 (two) times daily.  60 tablet  6  . aspirin 81 MG tablet Take 81 mg by mouth every morning.       . chlorthalidone (HYGROTON) 25 MG tablet Take 1 tablet (25 mg total) by mouth as needed (fluid retention).  30 tablet  2  . chlorzoxazone (PARAFON) 500 MG tablet Take 1 tablet (500 mg total) by mouth 3 (three) times  daily as needed for muscle spasms.  30 tablet  0  . DIGOX 250 MCG tablet TAKE 1 TABLET BY MOUTH EVERY MORNING  30 tablet  6  . diltiazem (TIAZAC) 360 MG 24 hr capsule TAKE 1 CAPSULE BY MOUTH EVERY DAY  30 capsule  9  . lisinopril (PRINIVIL,ZESTRIL) 5 MG tablet Take 1 tablet (5 mg total) by mouth daily.  90 tablet  3  . metFORMIN (GLUCOPHAGE) 500 MG tablet Take 2 tablets (1,000 mg total) by mouth 2 (two) times daily with a meal.  120 tablet  2  . Multiple Vitamins-Minerals (MULTIVITAMIN WITH MINERALS) tablet Take 1 tablet by mouth daily.        . Omega-3 Fatty Acids (FISH OIL PO) Take 1 tablet by mouth daily.      Marland Kitchen omeprazole (PRILOSEC) 20 MG capsule Take 20 mg by mouth daily as needed (Acid Reflux).      . potassium chloride SA (K-DUR,KLOR-CON) 20 MEQ tablet Take 20 mEq by mouth as needed (Takes with chlorthalidone).       . pravastatin (PRAVACHOL) 20 MG tablet TAKE 1 TABLET BY MOUTH EVERY DAY  60 tablet  3  . rOPINIRole (REQUIP) 2 MG tablet TAKE 1 TABLET BY MOUTH EVERY NIGHT AT BEDTIME  30 tablet  5  . sodium chloride (OCEAN) 0.65 % SOLN  nasal spray Place 1 spray into both nostrils as needed for congestion.      . traZODone (DESYREL) 50 MG tablet Take one tab at bedtime for 7 days then 2 tablets at bedtime  60 tablet  3  . [DISCONTINUED] diltiazem (CARDIZEM CD) 360 MG 24 hr capsule Take 1 capsule (360 mg total) by mouth every evening.  30 capsule  6   No current facility-administered medications for this visit.    Allergies as of 04/29/2014  . (No Known Allergies)    Family History  Problem Relation Age of Onset  . Hypertension Mother   . Heart attack Father   . Diabetes Brother   . Colon cancer Neg Hx     History   Social History  . Marital Status: Married    Spouse Name: N/A    Number of Children: N/A  . Years of Education: N/A   Occupational History  . employed     full-time   Social History Main Topics  . Smoking status: Former Smoker -- 1.00 packs/day for 32 years     Types: Cigarettes    Quit date: 07/04/2013  . Smokeless tobacco: Never Used  . Alcohol Use: No  . Drug Use: No  . Sexual Activity: Not on file   Other Topics Concern  . Not on file   Social History Narrative  . No narrative on file    Review of Systems: Gen: see HPI  CV: +palpitations at times, known afib Resp: occasional SOB with afib  GI: see HPI GU : Denies urinary burning, urinary frequency, urinary hesitancy MS: +joint pain Derm: Denies rash, itching, dry skin Psych: Denies depression, anxiety, memory loss, and confusion Heme: Denies bruising, bleeding, and enlarged lymph nodes.  Physical Exam: BP 134/80  Pulse 71  Temp(Src) 98.1 F (36.7 C) (Oral)  Ht 5\' 8"  (1.727 m)  Wt 227 lb 3.2 oz (103.057 kg)  BMI 34.55 kg/m2 General:   Alert and oriented. Pleasant and cooperative. Well-nourished and well-developed.  Head:  Normocephalic and atraumatic. Eyes:  Without icterus, sclera clear and conjunctiva pink.  Ears:  Normal auditory acuity. Nose:  No deformity, discharge,  or lesions. Mouth:  No deformity or lesions, oral mucosa pink.  Lungs:  Clear to auscultation bilaterally. No wheezes, rales, or rhonchi. No distress.  Heart:  S1, S2 present, irregularly irregular Abdomen:  +BS, soft, non-tender and non-distended. No HSM noted. No guarding or rebound. No masses appreciated.  Rectal:  Deferred  Msk:  Symmetrical without gross deformities. Normal posture. Extremities:  Without clubbing or edema. Neurologic:  Alert and  oriented x4;  grossly normal neurologically. Skin:  Intact without significant lesions or rashes. Psych:  Alert and cooperative. Normal mood and affect.

## 2014-04-29 NOTE — Telephone Encounter (Signed)
Dr. Harl Bowie,   This pt was seen by Laban Emperor, NP in our office today to get scheduled for screening colonoscopy.  He will be scheduled in the near future with Dr. Barney Drain.  Please advise if is is OK to hold the Eliquis 48-72 hours prior to colonoscopy.

## 2014-04-29 NOTE — Patient Instructions (Signed)
We have scheduled you for a colonoscopy with Dr. Oneida Alar in the near future.  We are making sure it is ok to hold Eliquis for 3 days prior to the colonoscopy. We will let you know for sure.

## 2014-04-30 DIAGNOSIS — Z1211 Encounter for screening for malignant neoplasm of colon: Secondary | ICD-10-CM | POA: Insufficient documentation

## 2014-04-30 NOTE — Assessment & Plan Note (Addendum)
58 year old male due for initial screening colonoscopy without any upper or lower concerning GI symptoms. On Eliquis for afib. Will be requesting approval from cardiology to hold X 3 days.   Proceed with colonoscopy with Dr. Oneida Alar in the near future. The risks, benefits, and alternatives have been discussed in detail with the patient. They state understanding and desire to proceed.  Likely hold Eliquis X 3 days; discuss with cardiology  ADDENDUM: ELIQUIS WILL BE HOLD 48 HOURS PER CARDIOLOGY

## 2014-05-01 ENCOUNTER — Encounter: Payer: Self-pay | Admitting: Family Medicine

## 2014-05-01 NOTE — Progress Notes (Signed)
CC TO PCP °

## 2014-05-07 ENCOUNTER — Encounter (HOSPITAL_COMMUNITY): Payer: Self-pay | Admitting: Pharmacy Technician

## 2014-05-07 ENCOUNTER — Telehealth: Payer: Self-pay | Admitting: Cardiology

## 2014-05-07 ENCOUNTER — Ambulatory Visit (INDEPENDENT_AMBULATORY_CARE_PROVIDER_SITE_OTHER): Payer: 59 | Admitting: Cardiology

## 2014-05-07 ENCOUNTER — Encounter: Payer: Self-pay | Admitting: Cardiology

## 2014-05-07 ENCOUNTER — Other Ambulatory Visit: Payer: Self-pay | Admitting: Gastroenterology

## 2014-05-07 VITALS — BP 150/82 | HR 93 | Ht 68.0 in | Wt 225.0 lb

## 2014-05-07 DIAGNOSIS — I4891 Unspecified atrial fibrillation: Secondary | ICD-10-CM

## 2014-05-07 DIAGNOSIS — I10 Essential (primary) hypertension: Secondary | ICD-10-CM

## 2014-05-07 DIAGNOSIS — E785 Hyperlipidemia, unspecified: Secondary | ICD-10-CM

## 2014-05-07 DIAGNOSIS — Z1211 Encounter for screening for malignant neoplasm of colon: Secondary | ICD-10-CM

## 2014-05-07 MED ORDER — CHLORTHALIDONE 25 MG PO TABS: 25.0000 mg | ORAL_TABLET | ORAL | Status: DC | PRN

## 2014-05-07 MED ORDER — ATORVASTATIN CALCIUM 40 MG PO TABS: 40.0000 mg | ORAL_TABLET | Freq: Every day | ORAL | Status: DC

## 2014-05-07 MED ORDER — SILDENAFIL CITRATE 100 MG PO TABS: 100.0000 mg | ORAL_TABLET | Freq: Every day | ORAL | Status: DC | PRN

## 2014-05-07 MED ORDER — PEG 3350-KCL-NA BICARB-NACL 420 G PO SOLR: 4000.0000 mL | ORAL | Status: DC

## 2014-05-07 NOTE — Patient Instructions (Signed)
Your physician wants you to follow-up in: 6 months You will receive a reminder letter in the mail two months in advance. If you don't receive a letter, please call our office to schedule the follow-up appointment.      Your physician has recommended you make the following change in your medication:     STOP Pravastatin    START Lipitor 40 mg daily   Please keep BP log and return in 2 weeks for MD to review readings         Thank you for choosing [facility] !

## 2014-05-07 NOTE — Telephone Encounter (Signed)
Ok to hold eliquis as needed for planned colonoscopy. Typically held 48 hours prior and resumed the day after for low to intermediate bleeding risk procedures.   Zandra Abts MD

## 2014-05-07 NOTE — Telephone Encounter (Signed)
Routing to Laban Emperor, NP and Darius Bump.

## 2014-05-07 NOTE — Progress Notes (Signed)
Clinical Summary Mr. Ke is a 58 y.o.male seen today for follow up of the following medical problems.   1. Paroxysmal afib  - just occasional palpitations, which are tolerable.  - CHADS2Vasc score is 2, on eliquis without any bleeding troubles  2. Non-obstructive CAD  - mild non-obstructive cath in 2000, recent MPI Jan 2015 without any ischemia  - has not had any recent chest pain   3. HTN  - checks at home regularly, typically 130/80s - compliant with meds   4. Tobacco  - quit Nov 1, has not restarted  5. Hyperlipidemia   - compliant with statin   Past Medical History  Diagnosis Date  . Paroxysmal atrial fibrillation 10/2004    Onset in 10/2004; recurred 09/2008  . Arteriosclerotic cardiovascular disease (ASCVD)     Nonobstructive; cath in 2000: 30-40% mid LAD and proximal RCA;normal EF. stress nuclear in 2006-subtle inferoseptal hypoperfusion with reversibility; negative stress EKG; good exercise tolerance  . Hypertension   . Sinusitis   . Insomnia   . Obstructive sleep apnea 12/2009    01/26/2010 AHI 83/hr  . Diabetes mellitus without complication   . PUD (peptic ulcer disease)     1980s  . Hyperlipidemia   . RLS (restless legs syndrome)   . Asthma   . CHF (congestive heart failure)      No Known Allergies   Current Outpatient Prescriptions  Medication Sig Dispense Refill  . albuterol-ipratropium (COMBIVENT) 18-103 MCG/ACT inhaler Inhale 2 puffs into the lungs every 6 (six) hours as needed.  14.7 g  5  . apixaban (ELIQUIS) 5 MG TABS tablet Take 1 tablet (5 mg total) by mouth 2 (two) times daily.  60 tablet  6  . aspirin 81 MG tablet Take 81 mg by mouth every morning.       . chlorthalidone (HYGROTON) 25 MG tablet Take 1 tablet (25 mg total) by mouth as needed (fluid retention).  30 tablet  2  . chlorzoxazone (PARAFON) 500 MG tablet Take 1 tablet (500 mg total) by mouth 3 (three) times daily as needed for muscle spasms.  30 tablet  0  . DIGOX 250 MCG  tablet TAKE 1 TABLET BY MOUTH EVERY MORNING  30 tablet  6  . diltiazem (TIAZAC) 360 MG 24 hr capsule TAKE 1 CAPSULE BY MOUTH EVERY DAY  30 capsule  9  . lisinopril (PRINIVIL,ZESTRIL) 5 MG tablet Take 1 tablet (5 mg total) by mouth daily.  90 tablet  3  . metFORMIN (GLUCOPHAGE) 500 MG tablet Take 2 tablets (1,000 mg total) by mouth 2 (two) times daily with a meal.  120 tablet  2  . Multiple Vitamins-Minerals (MULTIVITAMIN WITH MINERALS) tablet Take 1 tablet by mouth daily.        . Omega-3 Fatty Acids (FISH OIL PO) Take 1 tablet by mouth daily.      Marland Kitchen omeprazole (PRILOSEC) 20 MG capsule Take 20 mg by mouth daily as needed (Acid Reflux).      . potassium chloride SA (K-DUR,KLOR-CON) 20 MEQ tablet Take 20 mEq by mouth as needed (Takes with chlorthalidone).       . pravastatin (PRAVACHOL) 20 MG tablet TAKE 1 TABLET BY MOUTH EVERY DAY  60 tablet  3  . rOPINIRole (REQUIP) 2 MG tablet TAKE 1 TABLET BY MOUTH EVERY NIGHT AT BEDTIME  30 tablet  5  . sodium chloride (OCEAN) 0.65 % SOLN nasal spray Place 1 spray into both nostrils as needed for congestion.      Marland Kitchen  traZODone (DESYREL) 50 MG tablet Take one tab at bedtime for 7 days then 2 tablets at bedtime  60 tablet  3  . [DISCONTINUED] diltiazem (CARDIZEM CD) 360 MG 24 hr capsule Take 1 capsule (360 mg total) by mouth every evening.  30 capsule  6   No current facility-administered medications for this visit.     Past Surgical History  Procedure Laterality Date  . Hernia repair  1986    Left inguinal  . Rotator cuff repair      Right     No Known Allergies    Family History  Problem Relation Age of Onset  . Hypertension Mother   . Heart attack Father   . Diabetes Brother   . Colon cancer Neg Hx      Social History Mr. Shirer reports that he quit smoking about 10 months ago. His smoking use included Cigarettes. He has a 32 pack-year smoking history. He has never used smokeless tobacco. Mr. Tippin reports that he does not drink  alcohol.   Review of Systems CONSTITUTIONAL: No weight loss, fever, chills, weakness or fatigue.  HEENT: Eyes: No visual loss, blurred vision, double vision or yellow sclerae.No hearing loss, sneezing, congestion, runny nose or sore throat.  SKIN: No rash or itching.  CARDIOVASCULAR: per HPI RESPIRATORY: No shortness of breath, cough or sputum.  GASTROINTESTINAL: No anorexia, nausea, vomiting or diarrhea. No abdominal pain or blood.  GENITOURINARY: No burning on urination, no polyuria NEUROLOGICAL: No headache, dizziness, syncope, paralysis, ataxia, numbness or tingling in the extremities. No change in bowel or bladder control.  MUSCULOSKELETAL: No muscle, back pain, joint pain or stiffness.  LYMPHATICS: No enlarged nodes. No history of splenectomy.  PSYCHIATRIC: No history of depression or anxiety.  ENDOCRINOLOGIC: No reports of sweating, cold or heat intolerance. No polyuria or polydipsia.  Marland Kitchen   Physical Examination Filed Vitals:   05/07/14 1610  BP: 150/82  Pulse: 93   Filed Weights   05/07/14 1610  Weight: 225 lb (102.059 kg)    Gen: resting comfortably, no acute distress HEENT: no scleral icterus, pupils equal round and reactive, no palptable cervical adenopathy,  CV: RRR, no m/r/g, no JVD, no carotid bruits Resp: Clear to auscultation bilaterally GI: abdomen is soft, non-tender, non-distended, normal bowel sounds, no hepatosplenomegaly MSK: extremities are warm, no edema.  Skin: warm, no rash Neuro:  no focal deficits Psych: appropriate affect   Diagnostic Studies Jan 2015 MPI  IMPRESSION: 1. Normal Lexiscan Cardiolite stress test. 2. No evidence of ischemia or scar. 3. Normal left ventricular systolic function, calculated LVEF 62%.      Assessment and Plan  1. Paroxysmal afib  - mild symptoms at times, overall not bothersome - continue current meds including eliquis   2. HTN  - home numbers are borderline given his DM, he is elevated here in [place] - he  will keep bp log x 2 weeks and drop off, if consistently above 130/80 will increase lisinopril to 10mg  daily  3. Hyperlipidemia  - change to atorvastatin 40mg  daily, given his hx of DM recommended dose based on most recent guidelines.             Arnoldo Lenis, M.D., F.A.C.C.

## 2014-05-07 NOTE — Telephone Encounter (Signed)
Spoke to [place] and she will address with Dr. Harl Bowie.

## 2014-05-07 NOTE — Telephone Encounter (Signed)
TCS is scheduled with [facility] on 9/21 and he is aware of intructions and I have also mailed him instructions

## 2014-05-19 ENCOUNTER — Encounter (HOSPITAL_COMMUNITY)
Admission: RE | Disposition: A | Discharge status: Home / Self Care | Payer: Self-pay | Source: Ambulatory Visit | Attending: Gastroenterology

## 2014-05-19 ENCOUNTER — Encounter (HOSPITAL_COMMUNITY): Payer: Self-pay | Admitting: *Deleted

## 2014-05-19 ENCOUNTER — Ambulatory Visit (HOSPITAL_COMMUNITY)
Admission: RE | Admit: 2014-05-19 | Discharge: 2014-05-19 | Disposition: A | Discharge status: Home / Self Care | Payer: 59 | Source: Ambulatory Visit | Attending: Gastroenterology | Admitting: Gastroenterology

## 2014-05-19 DIAGNOSIS — D128 Benign neoplasm of rectum: Secondary | ICD-10-CM | POA: Diagnosis not present

## 2014-05-19 DIAGNOSIS — K5730 Diverticulosis of large intestine without perforation or abscess without bleeding: Secondary | ICD-10-CM | POA: Diagnosis not present

## 2014-05-19 DIAGNOSIS — E119 Type 2 diabetes mellitus without complications: Secondary | ICD-10-CM | POA: Diagnosis not present

## 2014-05-19 DIAGNOSIS — Z1211 Encounter for screening for malignant neoplasm of colon: Secondary | ICD-10-CM | POA: Diagnosis present

## 2014-05-19 DIAGNOSIS — I10 Essential (primary) hypertension: Secondary | ICD-10-CM | POA: Insufficient documentation

## 2014-05-19 DIAGNOSIS — Q438 Other specified congenital malformations of intestine: Secondary | ICD-10-CM | POA: Insufficient documentation

## 2014-05-19 DIAGNOSIS — K644 Residual hemorrhoidal skin tags: Secondary | ICD-10-CM | POA: Diagnosis not present

## 2014-05-19 DIAGNOSIS — Z87891 Personal history of nicotine dependence: Secondary | ICD-10-CM | POA: Diagnosis not present

## 2014-05-19 DIAGNOSIS — Z79899 Other long term (current) drug therapy: Secondary | ICD-10-CM | POA: Insufficient documentation

## 2014-05-19 DIAGNOSIS — E785 Hyperlipidemia, unspecified: Secondary | ICD-10-CM | POA: Insufficient documentation

## 2014-05-19 DIAGNOSIS — D126 Benign neoplasm of colon, unspecified: Secondary | ICD-10-CM | POA: Insufficient documentation

## 2014-05-19 DIAGNOSIS — D129 Benign neoplasm of anus and anal canal: Secondary | ICD-10-CM

## 2014-05-19 DIAGNOSIS — I4891 Unspecified atrial fibrillation: Secondary | ICD-10-CM | POA: Insufficient documentation

## 2014-05-19 DIAGNOSIS — G4733 Obstructive sleep apnea (adult) (pediatric): Secondary | ICD-10-CM | POA: Insufficient documentation

## 2014-05-19 HISTORY — PX: COLONOSCOPY: SHX5424

## 2014-05-19 LAB — GLUCOSE, CAPILLARY: GLUCOSE-CAPILLARY: 136 mg/dL — AB (ref 70–99)

## 2014-05-19 SURGERY — COLONOSCOPY
Anesthesia: Moderate Sedation

## 2014-05-19 MED ORDER — MIDAZOLAM HCL 5 MG/5ML IJ SOLN
INTRAMUSCULAR | Status: DC | PRN
  Administered 2014-05-19 (×2): 1 mg via INTRAVENOUS
  Administered 2014-05-19 (×3): 2 mg via INTRAVENOUS

## 2014-05-19 MED ORDER — SODIUM CHLORIDE 0.9 % IV SOLN
INTRAVENOUS | Status: DC
  Administered 2014-05-19: 11:00:00 via INTRAVENOUS

## 2014-05-19 MED ORDER — MEPERIDINE HCL 100 MG/ML IJ SOLN
INTRAMUSCULAR | Status: DC | PRN
  Administered 2014-05-19 (×4): 25 mg via INTRAVENOUS

## 2014-05-19 MED ORDER — MIDAZOLAM HCL 5 MG/5ML IJ SOLN
INTRAMUSCULAR | Status: AC
  Filled 2014-05-19: qty 10

## 2014-05-19 MED ORDER — MEPERIDINE HCL 100 MG/ML IJ SOLN
INTRAMUSCULAR | Status: AC
  Filled 2014-05-19: qty 2

## 2014-05-19 MED ORDER — STERILE WATER FOR IRRIGATION IR SOLN
Status: DC | PRN
  Administered 2014-05-19: 11:00:00

## 2014-05-19 NOTE — Discharge Instructions (Signed)
You had 30 polyps removed. You have external hemorrhoids and diverticulosis IN YOUR RIGHT AND LEFT COLON.   RE-START ELIQUIS ON SEP 26.  FOLLOW A HIGH FIBER DIET. AVOID ITEMS THAT CAUSE BLOATING. SEE INFO BELOW.  YOUR BIOPSY RESULTS SHOULD BE BACK IN 14 DAYS.  YOUR SISTERS, BROTHERS, CHILDREN, AND PARENTS NEED TO HAVE A COLONOSCOPY STARTING AT THE AGE OF 40.   Next colonoscopy in 1-3 years.    Colonoscopy Care After Read the instructions outlined below and refer to this sheet in the next week. These discharge instructions provide you with general information on caring for yourself after you leave the hospital. While your treatment has been planned according to the most current medical practices available, unavoidable complications occasionally occur. If you have any problems or questions after discharge, call DR. Johnnathan Hagemeister, [phone].  ACTIVITY  You may resume your regular activity, but move at a slower pace for the next 24 hours.   Take frequent rest periods for the next 24 hours.   Walking will help get rid of the air and reduce the bloated feeling in your belly (abdomen).   No driving for 24 hours (because of the medicine (anesthesia) used during the test).   You may shower.   Do not sign any important legal documents or operate any machinery for 24 hours (because of the anesthesia used during the test).    NUTRITION  Drink plenty of fluids.   You may resume your normal diet as instructed by your doctor.   Begin with a light meal and progress to your normal diet. Heavy or fried foods are harder to digest and may make you feel sick to your stomach (nauseated).   Avoid alcoholic beverages for 24 hours or as instructed.    MEDICATIONS  You may resume your normal medications.   WHAT YOU CAN EXPECT TODAY  Some feelings of bloating in the abdomen.   Passage of more gas than usual.   Spotting of blood in your stool or on the toilet paper  .  IF YOU HAD POLYPS  REMOVED DURING THE COLONOSCOPY:  Eat a soft diet IF YOU HAVE NAUSEA, BLOATING, ABDOMINAL PAIN, OR VOMITING.    FINDING OUT THE RESULTS OF YOUR TEST Not all test results are available during your visit. DR. Oneida Alar WILL CALL YOU WITHIN 7 DAYS OF YOUR PROCEDUE WITH YOUR RESULTS. Do not assume everything is normal if you have not heard from DR. Tanylah Schnoebelen IN ONE WEEK, CALL HER OFFICE AT [phone].  SEEK IMMEDIATE MEDICAL ATTENTION AND CALL THE OFFICE: [phone] IF:  You have more than a spotting of blood in your stool.   Your belly is swollen (abdominal distention).   You are nauseated or vomiting.   You have a temperature over 101F.   You have abdominal pain or discomfort that is severe or gets worse throughout the day.  Polyps, Colon  A polyp is extra tissue that grows inside your body. Colon polyps grow in the large intestine. The large intestine, also called the colon, is part of your digestive system. It is a long, hollow tube at the end of your digestive tract where your body makes and stores stool. Most polyps are not dangerous. They are benign. This means they are not cancerous. But over time, some types of polyps can turn into cancer. Polyps that are smaller than a pea are usually not harmful. But larger polyps could someday become or may already be cancerous. To be safe, doctors remove all polyps and  test them.   WHO GETS POLYPS? Anyone can get polyps, but certain people are more likely than others. You may have a greater chance of getting polyps if:  You are over 50.   You have had polyps before.   Someone in your family has had polyps.   Someone in your family has had cancer of the large intestine.   Find out if someone in your family has had polyps. You may also be more likely to get polyps if you:   Eat a lot of fatty foods   Smoke   Drink alcohol   Do not exercise  Eat too much   TREATMENT  The caregiver will remove the polyp during sigmoidoscopy or  colonoscopy.  PREVENTION There is not one sure way to prevent polyps. You might be able to lower your risk of getting them if you:  Eat more fruits and vegetables and less fatty food.   Do not smoke.   Avoid alcohol.   Exercise every day.   Lose weight if you are overweight.   Eating more calcium and folate can also lower your risk of getting polyps. Some foods that are rich in calcium are milk, cheese, and broccoli. Some foods that are rich in folate are chickpeas, kidney beans, and spinach.   High-Fiber Diet A high-fiber diet changes your normal diet to include more whole grains, legumes, fruits, and vegetables. Changes in the diet involve replacing refined carbohydrates with unrefined foods. The calorie level of the diet is essentially unchanged. The Dietary Reference Intake (recommended amount) for adult males is 38 grams per day. For adult females, it is 25 grams per day. Pregnant and lactating women should consume 28 grams of fiber per day. Fiber is the intact part of a plant that is not broken down during digestion. Functional fiber is fiber that has been isolated from the plant to provide a beneficial effect in the body. PURPOSE  Increase stool bulk.   Ease and regulate bowel movements.   Lower cholesterol.  INDICATIONS THAT YOU NEED MORE FIBER  Constipation and hemorrhoids.   Uncomplicated diverticulosis (intestine condition) and irritable bowel syndrome.   Weight management.   As a protective measure against hardening of the arteries (atherosclerosis), diabetes, and cancer.   GUIDELINES FOR INCREASING FIBER IN THE DIET  Start adding fiber to the diet slowly. A gradual increase of about 5 more grams (2 slices of whole-wheat bread, 2 servings of most fruits or vegetables, or 1 bowl of high-fiber cereal) per day is best. Too rapid an increase in fiber may result in constipation, flatulence, and bloating.   Drink enough water and fluids to keep your urine clear or pale  yellow. Water, juice, or caffeine-free drinks are recommended. Not drinking enough fluid may cause constipation.   Eat a variety of high-fiber foods rather than one type of fiber.   Try to increase your intake of fiber through using high-fiber foods rather than fiber pills or supplements that contain small amounts of fiber.   The goal is to change the types of food eaten. Do not supplement your present diet with high-fiber foods, but replace foods in your present diet.  INCLUDE A VARIETY OF FIBER SOURCES  Replace refined and processed grains with whole grains, canned fruits with fresh fruits, and incorporate other fiber sources. White rice, white breads, and most bakery goods contain little or no fiber.   Brown whole-grain rice, buckwheat oats, and many fruits and vegetables are all good sources of fiber.  These include: broccoli, Brussels sprouts, cabbage, cauliflower, beets, sweet potatoes, white potatoes (skin on), carrots, tomatoes, eggplant, squash, berries, fresh fruits, and dried fruits.   Cereals appear to be the richest source of fiber. Cereal fiber is found in whole grains and bran. Bran is the fiber-rich outer coat of cereal grain, which is largely removed in refining. In whole-grain cereals, the bran remains. In breakfast cereals, the largest amount of fiber is found in those with "bran" in their names. The fiber content is sometimes indicated on the label.   You may need to include additional fruits and vegetables each day.   In baking, for 1 cup white flour, you may use the following substitutions:   1 cup whole-wheat flour minus 2 tablespoons.   1/2 cup white flour plus 1/2 cup whole-wheat flour.   Diverticulosis Diverticulosis is a common condition that develops when small pouches (diverticula) form in the wall of the colon. The risk of diverticulosis increases with age. It happens more often in people who eat a low-fiber diet. Most individuals with diverticulosis have no  symptoms. Those individuals with symptoms usually experience belly (abdominal) pain, constipation, or loose stools (diarrhea).  HOME CARE INSTRUCTIONS  Increase the amount of fiber in your diet as directed by your caregiver or dietician. This may reduce symptoms of diverticulosis.   Drink at least 6 to 8 glasses of water each day to prevent constipation.   Try not to strain when you have a bowel movement.   Avoiding nuts and seeds to prevent complications is still an uncertain benefit.       FOODS HAVING HIGH FIBER CONTENT INCLUDE:  Fruits. Apple, peach, pear, tangerine, raisins, prunes.   Vegetables. Brussels sprouts, asparagus, broccoli, cabbage, carrot, cauliflower, romaine lettuce, spinach, summer squash, tomato, winter squash, zucchini.   Starchy Vegetables. Baked beans, kidney beans, lima beans, split peas, lentils, potatoes (with skin).   Grains. Whole wheat bread, brown rice, bran flake cereal, plain oatmeal, white rice, shredded wheat, bran muffins.    SEEK IMMEDIATE MEDICAL CARE IF:  You develop increasing pain or severe bloating.   You have an oral temperature above 101F.   You develop vomiting or bowel movements that are bloody or black.   Hemorrhoids Hemorrhoids are dilated (enlarged) veins around the rectum. Sometimes clots will form in the veins. This makes them swollen and painful. These are called thrombosed hemorrhoids. Causes of hemorrhoids include:  Constipation.   Straining to have a bowel movement.   HEAVY LIFTING HOME CARE INSTRUCTIONS  Eat a well balanced diet and drink 6 to 8 glasses of water every day to avoid constipation. You may also use a bulk laxative.   Avoid straining to have bowel movements.   Keep anal area dry and clean.   Do not use a donut shaped pillow or sit on the toilet for long periods. This increases blood pooling and pain.   Move your bowels when your body has the urge; this will require less straining and will decrease  pain and pressure.

## 2014-05-19 NOTE — Progress Notes (Signed)
REVIEWED-NO ADDITIONAL RECOMMENDATIONS. 

## 2014-05-19 NOTE — H&P (Signed)
Primary Care Physician:  Rubbie Battiest, MD Primary Gastroenterologist:  Dr. Oneida Alar  Pre-Procedure History & Physical: HPI:  Shaun [name] is a 58 y.o. male here for COLON CANCER SCREENING.  Past Medical History  Diagnosis Date  . Paroxysmal atrial fibrillation 10/2004    Onset in 10/2004; recurred 09/2008  . Arteriosclerotic cardiovascular disease (ASCVD)     Nonobstructive; cath in 2000: 30-40% mid LAD and proximal RCA;normal EF. stress nuclear in 2006-subtle inferoseptal hypoperfusion with reversibility; negative stress EKG; good exercise tolerance  . Hypertension   . Sinusitis   . Insomnia   . Obstructive sleep apnea 12/2009    01/26/2010 AHI 83/hr  . Diabetes mellitus without complication   . PUD (peptic ulcer disease)     1980s  . Hyperlipidemia   . RLS (restless legs syndrome)   . Asthma   . CHF (congestive heart failure)     Past Surgical History  Procedure Laterality Date  . Hernia repair  1986    Left inguinal  . Rotator cuff repair      Right    Prior to Admission medications   Medication Sig Start Date End Date Taking? Authorizing Provider  apixaban (ELIQUIS) 5 MG TABS tablet Take 1 tablet (5 mg total) by mouth 2 (two) times daily. 04/14/14  Yes Lendon Colonel, NP  aspirin 81 MG tablet Take 81 mg by mouth every morning.    Yes Historical Provider, MD  atorvastatin (LIPITOR) 40 MG tablet Take 1 tablet (40 mg total) by mouth daily. 05/07/14  Yes Arnoldo Lenis, MD  chlorthalidone (HYGROTON) 25 MG tablet Take 1 tablet (25 mg total) by mouth as needed (fluid retention). 05/07/14  Yes Arnoldo Lenis, MD  chlorzoxazone (PARAFON) 500 MG tablet Take 1 tablet (500 mg total) by mouth 3 (three) times daily as needed for muscle spasms. 02/19/14  Yes Mikey Kirschner, MD  [place] 250 MCG tablet TAKE 1 TABLET BY MOUTH EVERY MORNING 01/13/14  Yes Lendon Colonel, NP  diltiazem [facility]) 360 MG 24 hr capsule TAKE 1 CAPSULE BY MOUTH EVERY DAY 02/11/14  Yes Lendon Colonel, NP   lisinopril (PRINIVIL,ZESTRIL) 5 MG tablet Take 1 tablet (5 mg total) by mouth daily. 09/17/13  Yes Arnoldo Lenis, MD  metFORMIN (GLUCOPHAGE) 500 MG tablet Take 2 tablets (1,000 mg total) by mouth 2 (two) times daily with a meal. 11/20/13  Yes Mikey Kirschner, MD  Multiple Vitamins-Minerals (MULTIVITAMIN WITH MINERALS) tablet Take 1 tablet by mouth daily.     Yes Historical Provider, MD  Omega-3 Fatty Acids (FISH OIL PO) Take 1 tablet by mouth daily.   Yes Historical Provider, MD  omeprazole (PRILOSEC) 20 MG capsule Take 20 mg by mouth daily as needed (Acid Reflux).   Yes Historical Provider, MD  rOPINIRole (REQUIP) 2 MG tablet TAKE 1 TABLET BY MOUTH EVERY NIGHT AT BEDTIME 12/13/13  Yes Mikey Kirschner, MD  sildenafil (VIAGRA) 100 MG tablet Take 1 tablet (100 mg total) by mouth daily as needed for erectile dysfunction. 05/07/14  Yes Arnoldo Lenis, MD  sodium chloride (OCEAN) 0.65 % SOLN nasal spray Place 1 spray into both nostrils as needed for congestion.   Yes Historical Provider, MD  traZODone (DESYREL) 50 MG tablet Take one tab at bedtime for 7 days then 2 tablets at bedtime 02/24/14  Yes Mikey Kirschner, MD  albuterol-ipratropium (COMBIVENT) 18-103 MCG/ACT inhaler Inhale 2 puffs into the lungs every 6 (six) hours as needed. 04/01/13   Scott  A Luking, MD  potassium chloride SA (K-DUR,KLOR-CON) 20 MEQ tablet Take 20 mEq by mouth as needed (Takes with chlorthalidone).     Historical Provider, MD    Allergies as of 05/07/2014  . (No Known Allergies)    Family History  Problem Relation Age of Onset  . Hypertension Mother   . Heart attack Father   . Diabetes Brother   . Colon cancer Neg Hx     History   Social History  . Marital Status: Married    Spouse Name: N/A    Number of Children: N/A  . Years of Education: N/A   Occupational History  . employed     full-time   Social History Main Topics  . Smoking status: Former Smoker -- 1.00 packs/day for 32 years    Types:  Cigarettes    Quit date: 07/04/2013  . Smokeless tobacco: Never Used  . Alcohol Use: No  . Drug Use: No  . Sexual Activity: Not on file   Other Topics Concern  . Not on file   Social History Narrative  . No narrative on file    Review of Systems: See HPI, otherwise negative ROS   Physical Exam: BP 124/72  Pulse 77  Temp(Src) 98 F (36.7 C) (Oral)  Resp 18  SpO2 98% General:   Alert,  pleasant and cooperative in NAD Head:  Normocephalic and atraumatic. Neck:  Supple; Lungs:  Clear throughout to auscultation.    Heart:  Regular rate and rhythm. Abdomen:  Soft, nontender and nondistended. Normal bowel sounds, without guarding, and without rebound.   Neurologic:  Alert and  oriented x4;  grossly normal neurologically.  Impression/Plan:     SCREENING  Plan:  1. TCS TODAY

## 2014-05-20 NOTE — Op Note (Signed)
[place], [id number]   COLONOSCOPY PROCEDURE REPORT     EXAM DATE: 05/19/2014  PATIENT NAME:      Shaun [name], Shaun [name]           MR #: [id number] BIRTHDATE:       [birth year]      VISIT #:     [id number] ATTENDING:     Barney Drain, MD     STATUS:     outpatient ASSISTANT:  INDICATIONS:  The patient is a 58 yr old male here for a colonoscopy due to average risk for colon cancer. PROCEDURE PERFORMED:     Colonoscopy with snare polypectomy and Colonoscopy with cold biopsy polypectomy MEDICATIONS:     Meperidine (Demerol) 100 mg IV and Versed 8 mg IV  ESTIMATED BLOOD LOSS:     None  CONSENT: The patient understands the risks and benefits of the procedure and understands that these risks include, but are not limited to: sedation, allergic reaction, infection, perforation and/or bleeding. Alternative means of evaluation and treatment include, among others: physical exam, x-rays, and/or surgical intervention. The patient elects to proceed with this endoscopic procedure.  DESCRIPTION OF PROCEDURE: During intra-op preparation period all mechanical & medical equipment was checked for proper function. Hand hygiene and appropriate measures for infection prevention was taken. After the risks, benefits and alternatives of the procedure were thoroughly explained, Informed consent was verified, confirmed and timeout was successfully executed by the treatment team. A digital exam revealed no rectal mass.      The    endoscope was introduced through the anus and advanced to the cecum. The prep was good.. The instrument was then slowly withdrawn as the colon was fully examined.   COLON FINDINGS: Multiple polyps ranging between 5-32mm in size were found in the distal transverse colon, proximal transverse colon, sigmoid colon, descending colon, rectum, at the hepatic flexure, and in the ascending colon.  A polypectomy was performed using snare cautery.    Three polyps ranging from 2 to 47mm in size were found in the rectum.  A polypectomy was performed with cold forceps.   There was mild diverticulosis noted in the sigmoid colon and descending colon with associated muscular hypertrophy and tortuosity.   Moderate sized external hemorrhoids were found.   The colon was redundant.  Manual abdominal counter-pressure was used to reach the cecum.  The patient was moved on to their back to reach the cecum.     The scope was then completely withdrawn from the patient and the procedure terminated.    ADVERSE EVENTS:      There were no complications.      CECAL WITHDRAWAL TIME: 1 HR 7 MINS IMPRESSIONS:     1.  30 POLYPS REMOVED 3.  Mild diverticulosis in the sigmoid colon and descending colon 4.  Moderate sized external hemorrhoids 5.  The SIGMOID AND TRANSVERSE colon ARE redundant  RECOMMENDATIONS:     RE-START ELIQUIS ON SEP 26. FOLLOW A HIGH FIBER DIET.  AVOID ITEMS THAT CAUSE BLOATING. BIOPSY RESULTS SHOULD BE BACK IN 14 DAYS. ALL SISTERS, BROTHERS, CHILDREN, AND PARENTS NEED TO HAVE A COLONOSCOPY STARTING AT THE AGE OF 40. Next colonoscopy in 1-3 years WITH AN OVERTUBE. RECALL:  Barney Drain, MD eSigned:  Barney Drain, MD 05/20/2014 8:38 PM   cc:  Rosemary Holms, M.D.     PATIENT NAME:  Shaun [name], Shaun [name] MR#: [id number]

## 2014-05-21 ENCOUNTER — Encounter (HOSPITAL_COMMUNITY): Payer: Self-pay | Admitting: Gastroenterology

## 2014-05-26 ENCOUNTER — Other Ambulatory Visit: Payer: Self-pay | Admitting: Family Medicine

## 2014-05-27 ENCOUNTER — Telehealth: Payer: Self-pay | Admitting: Cardiology

## 2014-05-27 MED ORDER — APIXABAN 5 MG PO TABS: 5.0000 mg | ORAL_TABLET | Freq: Two times a day (BID) | ORAL | Status: DC

## 2014-05-27 NOTE — Telephone Encounter (Signed)
Received fax refill request  Rx # [id number] Medication:  Eliquis 5 mg tablets Qty 60 Sig:  Take one tablet by mouth twice daily Physician:  Harl Bowie

## 2014-06-01 ENCOUNTER — Other Ambulatory Visit: Payer: Self-pay | Admitting: Family Medicine

## 2014-06-19 ENCOUNTER — Telehealth: Payer: Self-pay | Admitting: Gastroenterology

## 2014-06-19 NOTE — Telephone Encounter (Signed)
Reminder in epic °

## 2014-06-19 NOTE — Telephone Encounter (Signed)
Called patient TO DISCUSS RESULTS. LVM-CALL [phone] TO DISCUSS. HE had >20 simple adenomas removed.  FOLLOW A HIGH FIBER DIET. AVOID ITEMS THAT CAUSE BLOATING.  ALL SISTERS, BROTHERS, CHILDREN, AND PARENTS NEED TO HAVE A COLONOSCOPY STARTING AT THE AGE OF 40 AND EVERY 5 YEARS  HE NEEDS TO SEE A GENETIC COUNSELOR TO BE TESTED FOR FAP (FAMILAL ADENOMATOUS POLYPOSIS SYNDROME).  Next colonoscopy in 1 year WITH AN OVERTUBE.

## 2014-06-23 NOTE — Telephone Encounter (Signed)
Pt called and was informed. OK to make the referral to [facility].

## 2014-06-30 ENCOUNTER — Ambulatory Visit (INDEPENDENT_AMBULATORY_CARE_PROVIDER_SITE_OTHER): Payer: 59 | Admitting: *Deleted

## 2014-06-30 ENCOUNTER — Encounter: Payer: Self-pay | Admitting: Family Medicine

## 2014-06-30 ENCOUNTER — Ambulatory Visit (INDEPENDENT_AMBULATORY_CARE_PROVIDER_SITE_OTHER): Payer: 59 | Admitting: Family Medicine

## 2014-06-30 ENCOUNTER — Other Ambulatory Visit: Payer: Self-pay

## 2014-06-30 VITALS — BP 122/74 | Ht 68.0 in | Wt 235.0 lb

## 2014-06-30 DIAGNOSIS — I10 Essential (primary) hypertension: Secondary | ICD-10-CM

## 2014-06-30 DIAGNOSIS — E782 Mixed hyperlipidemia: Secondary | ICD-10-CM

## 2014-06-30 DIAGNOSIS — E119 Type 2 diabetes mellitus without complications: Secondary | ICD-10-CM

## 2014-06-30 DIAGNOSIS — Z23 Encounter for immunization: Secondary | ICD-10-CM

## 2014-06-30 LAB — POCT GLYCOSYLATED HEMOGLOBIN (HGB A1C): HEMOGLOBIN A1C: 8

## 2014-06-30 MED ORDER — ROPINIROLE HCL 2 MG PO TABS: ORAL_TABLET | ORAL | Status: DC

## 2014-06-30 MED ORDER — TRIAMCINOLONE ACETONIDE 0.1 % EX CREA: 1.0000 "application " | TOPICAL_CREAM | Freq: Two times a day (BID) | CUTANEOUS | Status: DC

## 2014-06-30 MED ORDER — GLIPIZIDE 5 MG PO TABS: 5.0000 mg | ORAL_TABLET | Freq: Every day | ORAL | Status: DC

## 2014-06-30 NOTE — Telephone Encounter (Signed)
Referral made 

## 2014-06-30 NOTE — Progress Notes (Signed)
   Subjective:    Patient ID: Shaun [name], male    DOB: [birth year], 58 y.o.   MRN: [id number]  Diabetes He presents for his follow-up diabetic visit. He has type 2 diabetes mellitus. Hypoglycemia symptoms include sweats. Associated symptoms include fatigue and weakness. Current diabetic treatment includes oral agent (monotherapy). He is compliant with treatment all of the time. He monitors blood glucose at home 1-2 x per day. Blood glucose monitoring compliance is good. His overall blood glucose range is 130-140 mg/dl. He does not see a podiatrist.Eye exam is current.   Results for orders placed or performed in visit on 06/30/14  POCT glycosylated hemoglobin (Hb A1C)  Result Value Ref Range   Hemoglobin A1C 8.0     Numbers are up in the morn 180n to 200  Pt had multiple polyps, now on yrly sched  Exercise not going   Compliant with lipid medication. No obvious side effects. Handling it well.  Restless leg syndrome stable on current medication.  Compliantwith blood pressure medicine. Watching salt intake.   Breathing slightly wheezy at times   Review of Systems  Constitutional: Positive for fatigue.  Neurological: Positive for weakness.       Objective:   Physical Exam Alert no apparent distress. HEENT normal. Lungs clear. Heartirregular but controlled rate and rhythm. Ankles without edema       Assessment & Plan:  Impression #1 type 2 diabetes suboptimum control #2 hypertension good control #3 hyperlipidemia good control discussed plan diet discussed. Exercise discussed. Add Glucotrol 5 mg daily. Recheck in several months. Maintain same blood pressure medicine. Maintain same lipid medication. W SL

## 2014-07-08 ENCOUNTER — Other Ambulatory Visit: Payer: Self-pay | Admitting: Family Medicine

## 2014-07-15 ENCOUNTER — Telehealth: Payer: Self-pay | Admitting: Genetic Counselor

## 2014-07-15 ENCOUNTER — Other Ambulatory Visit: Payer: Self-pay | Admitting: Family Medicine

## 2014-07-15 NOTE — Telephone Encounter (Signed)
LEFT MESSAGE FOR PATIENT TO RETURN CALL TO SCHEDULE GENETIC APPT.  °

## 2014-07-23 ENCOUNTER — Telehealth: Payer: Self-pay | Admitting: Genetic Counselor

## 2014-07-23 NOTE — Telephone Encounter (Signed)
S/W PATIENT AND GAVE GENETIC APPT FOR 12/14 @ 9 W/KAREN [employer]ACK[employer] MAILED [employer]

## 2014-07-28 ENCOUNTER — Telehealth: Payer: Self-pay | Admitting: Family Medicine

## 2014-07-28 ENCOUNTER — Ambulatory Visit (HOSPITAL_COMMUNITY)
Admission: RE | Admit: 2014-07-28 | Discharge: 2014-07-28 | Disposition: A | Discharge status: Home / Self Care | Payer: 59 | Source: Ambulatory Visit | Attending: Family Medicine | Admitting: Family Medicine

## 2014-07-28 ENCOUNTER — Ambulatory Visit (INDEPENDENT_AMBULATORY_CARE_PROVIDER_SITE_OTHER): Payer: 59 | Admitting: Family Medicine

## 2014-07-28 ENCOUNTER — Encounter: Payer: Self-pay | Admitting: Family Medicine

## 2014-07-28 VITALS — BP 126/88 | Temp 98.6°F | Ht 68.0 in | Wt 229.0 lb

## 2014-07-28 DIAGNOSIS — M79674 Pain in right toe(s): Secondary | ICD-10-CM | POA: Diagnosis present

## 2014-07-28 DIAGNOSIS — S99921A Unspecified injury of right foot, initial encounter: Secondary | ICD-10-CM

## 2014-07-28 DIAGNOSIS — M7989 Other specified soft tissue disorders: Secondary | ICD-10-CM | POA: Diagnosis present

## 2014-07-28 DIAGNOSIS — S92424A Nondisplaced fracture of distal phalanx of right great toe, initial encounter for closed fracture: Secondary | ICD-10-CM | POA: Diagnosis not present

## 2014-07-28 DIAGNOSIS — E119 Type 2 diabetes mellitus without complications: Secondary | ICD-10-CM | POA: Insufficient documentation

## 2014-07-28 DIAGNOSIS — W208XXA Other cause of strike by thrown, projected or falling object, initial encounter: Secondary | ICD-10-CM | POA: Insufficient documentation

## 2014-07-28 MED ORDER — AMOXICILLIN-POT CLAVULANATE 875-125 MG PO TABS: 1.0000 | ORAL_TABLET | Freq: Two times a day (BID) | ORAL | Status: DC

## 2014-07-28 NOTE — Telephone Encounter (Signed)
Patient believes that he broke his toe yesterday, did not go to ER.  He is a diabetic and just wants a nurse to call him back to give him signs to look for since he is a diabetic.

## 2014-07-28 NOTE — Telephone Encounter (Signed)
Patient was transferred to front desk to schedule appointment for today.

## 2014-07-28 NOTE — Progress Notes (Signed)
   Subjective:    Patient ID: Shaun [name], male    DOB: [birth year], 58 y.o.   MRN: [id number]  HPI Patient is here today for toe pain.  Right, greater toe.  Was taking down Christmas decorations, and the lid to an iron skillet dropped and fell on his toe, yesterday.  tx: ice and soaked in [facility]  Also c/o of sinus infection that started today  Sinus congestion and drainage  Got worse tod after sev days   Also cough congestion drainage headache frontal nasal congestion.  Review of Systems No vomiting no diarrhea no rash    Objective:   Physical Exam  Alert no acute distress. Vital stable lungs clear heart rare rhythm great toe impressively swollen nasal discharge congestion evident      Assessment & Plan:  Impression sinusitis with foot injury plan sent for x-rays addendum true fracture transverse distal phalanx no articular involvement. Augmentin twice a day. Surgical shoe. Expect slow resolution. WSL

## 2014-07-29 ENCOUNTER — Encounter: Payer: Self-pay | Admitting: Family Medicine

## 2014-07-29 ENCOUNTER — Telehealth: Payer: Self-pay

## 2014-07-29 MED ORDER — HYDROCODONE-ACETAMINOPHEN 5-325 MG PO TABS: 1.0000 | ORAL_TABLET | Freq: Four times a day (QID) | ORAL | Status: DC | PRN

## 2014-07-29 NOTE — Telephone Encounter (Signed)
Please give patient a reduced work excuse for the next couple of weeks. Patient will be in after 3 pm to pick this up along with his hydrocodone script.

## 2014-08-11 ENCOUNTER — Ambulatory Visit (HOSPITAL_BASED_OUTPATIENT_CLINIC_OR_DEPARTMENT_OTHER): Payer: 59 | Admitting: Genetic Counselor

## 2014-08-11 ENCOUNTER — Encounter: Payer: Self-pay | Admitting: Genetic Counselor

## 2014-08-11 ENCOUNTER — Other Ambulatory Visit: Payer: 59

## 2014-08-11 DIAGNOSIS — Z808 Family history of malignant neoplasm of other organs or systems: Secondary | ICD-10-CM

## 2014-08-11 DIAGNOSIS — Z86010 Personal history of colonic polyps: Secondary | ICD-10-CM | POA: Insufficient documentation

## 2014-08-11 DIAGNOSIS — C4490 Unspecified malignant neoplasm of skin, unspecified: Secondary | ICD-10-CM

## 2014-08-11 DIAGNOSIS — Z803 Family history of malignant neoplasm of breast: Secondary | ICD-10-CM

## 2014-08-11 DIAGNOSIS — K635 Polyp of colon: Secondary | ICD-10-CM | POA: Insufficient documentation

## 2014-08-11 DIAGNOSIS — Z315 Encounter for genetic counseling: Secondary | ICD-10-CM

## 2014-08-11 NOTE — Progress Notes (Signed)
REFERRING PROVIDER: Merlyn Albert, MD [address] [place], [place] [address]  PRIMARY PROVIDER:  Harlow Asa, MD  PRIMARY REASON FOR VISIT:  1. Colon polyps   2. Family history of breast cancer   3. Skin cancer   4. Family history of skin cancer      HISTORY OF PRESENT ILLNESS:   [name], a 58 y.o. male, was seen for a [facility] cancer genetics consultation at the request of Dr. Darrick Penna due to a personal history of colon polyps and family history of cancer.  Mr. Galicia presents to clinic today to discuss the possibility of a hereditary predisposition to cancer, genetic testing, and to further clarify his future cancer risks, as well as potential cancer risks for family members.   CANCER HISTORY:   No history exists.     HISTORY OF PRESENT ILLNESS:  Mr. Soloway is a 58 y.o. male with no personal history of cancer.  He had his first colonoscopy this past fall and was found to have a total of 30 colon polyps.  He has notified his siblings of this finding and recommended that they also get a colonoscopy.    HORMONAL RISK FACTORS:  Smoking history: 36 year history of smoking.,  Currently down to 0.25 ppd. Colonoscopy: yes; abnormal.   Past Medical History  Diagnosis Date  . Paroxysmal atrial fibrillation 10/2004    Onset in 10/2004; recurred 09/2008  . Arteriosclerotic cardiovascular disease (ASCVD)     Nonobstructive; cath in 2000: 30-40% mid LAD and proximal RCA;normal EF. stress nuclear in 2006-subtle inferoseptal hypoperfusion with reversibility; negative stress EKG; good exercise tolerance  . Hypertension   . Sinusitis   . Insomnia   . Obstructive sleep apnea 12/2009    01/26/2010 AHI 83/hr  . Diabetes mellitus without complication   . PUD (peptic ulcer disease)     1980s  . Hyperlipidemia   . RLS (restless legs syndrome)   . Asthma   . CHF (congestive heart failure)   . Colon polyps     30 colon polyps found on first colonoscopy    Past Surgical  History  Procedure Laterality Date  . Hernia repair  1986    Left inguinal  . Rotator cuff repair      Right  . Colonoscopy N/A 05/19/2014    Procedure: COLONOSCOPY;  Surgeon: West Bali, MD;  Location: [facility];  Service: Endoscopy;  Laterality: N/A;  10:45    History   Social History  . Marital Status: Married    Spouse Name: N/A    Number of Children: 5  . Years of Education: N/A   Occupational History  . employed     full-time   Social History Main Topics  . Smoking status: Former Smoker -- 0.25 packs/day for 32 years    Types: Cigarettes    Quit date: 07/04/2013  . Smokeless tobacco: Never Used  . Alcohol Use: No  . Drug Use: No  . Sexual Activity: None   Other Topics Concern  . None   Social History Narrative     FAMILY HISTORY:  We obtained a detailed, 4-generation family history.  Significant diagnoses are listed below: Family History  Problem Relation Age of Onset  . Hypertension Mother   . Breast cancer Mother 15  . Heart attack Father   . Diabetes Brother   . Colon cancer Neg Hx   . Skin cancer Sister 73  . Brain cancer Maternal Uncle   .  Cancer Maternal Uncle     NOS  . Breast cancer Cousin     maternal cousin dx <50   The patient has 5 children, one brother and one sister.  His sister had skin cancer dx around age 60.  His mother had breast cancer, and had seven brothers and sisters.  One brother had a brain tumor, and another had cancer NOS.  One maternal cousin has breast cancer dx under age 93.  His father is living. He had several siblings, but nothing is known about the siblings or their children.  Patient's maternal ancestors are of [place] descent, and paternal ancestors are of [place] descent. There is no reported Ashkenazi Jewish ancestry. There is no known consanguinity.  GENETIC COUNSELING ASSESSMENT: Shaun [name] is a 58 y.o. male with a personal history of colon polyps and family history of breast cancer and skin cancer  which somewhat suggestive of a hereditary cancer syndrome and predisposition to cancer. We, therefore, discussed and recommended the following at today's visit.   DISCUSSION: We reviewed the characteristics, features and inheritance patterns of hereditary cancer syndromes. We also discussed genetic testing, including the appropriate family members to test, the process of testing, insurance coverage and turn-around-time for results. Based on Ms. Ligas's personal history of 30 colon polyps we reviewed polyposis syndromes including APC and MUTYH.  We also discussed HNPCC/Lynch syndrome based on the family history of breast cancer and brain tumor.  We discussed the implications of a negative, positive and/or variant of uncertain significant result. We recommended Mr. Ogata pursue genetic testing for the Colonext gene panel and MSH2 inversion testing.  The genes involved in this testing include: APC, BMPR1A, CDH1, CHEK2, EPCAM, GREM1, MLH1, MSH2, MSH6, MUTYH, PMS2, POLD1, POLE, PTEN, SMAD4, STK11, and TP53.     PLAN: After considering the risks, benefits, and limitations,Mr. Brasel  provided informed consent to pursue genetic testing and the blood sample was sent to [facility] for analysis of the Colonext panel testing, including MSH2 inv testing. Results should be available within approximately 3-4 weeks' time, at which point they will be disclosed by telephone to Mr. Limes, as will any additional recommendations warranted by these results. Mr. Xiao will receive a summary of his genetic counseling visit and a copy of his results once available. This information will also be available in Epic. We encouraged Mr. Tammen to remain in contact with cancer genetics annually so that we can continuously update the family history and inform him of any changes in cancer genetics and testing that may be of benefit for his family. Mr. Lichtman questions were answered to his satisfaction today.  Our contact information was provided should additional questions or concerns arise.  Lastly, we encouraged Mr. Kiner to remain in contact with cancer genetics annually so that we can continuously update the family history and inform him of any changes in cancer genetics and testing that may be of benefit for this family.   Mr.  Vetrano questions were answered to his satisfaction today. Our contact information was provided should additional questions or concerns arise. Thank you for the referral and allowing [place] to share in the care of your patient.   Lonzy Mato P. Florene Glen, [place], [facility] Certified Genetic Counselor [email or web address] phone: [phone]  The patient was seen for a total of 60 minutes in face-to-face genetic counseling.  This patient was discussed with Drs. Magrinat, Lindi Adie and/or Burr Medico who agrees with the above.    _______________________________________________________________________ For Office Staff:  Number of people  involved in session: 1 Was an Intern/ student involved with case: no

## 2014-08-16 ENCOUNTER — Other Ambulatory Visit: Payer: Self-pay | Admitting: Family Medicine

## 2014-08-26 ENCOUNTER — Other Ambulatory Visit: Payer: Self-pay | Admitting: Adult Health

## 2014-09-09 ENCOUNTER — Encounter: Payer: Self-pay | Admitting: Genetic Counselor

## 2014-09-09 ENCOUNTER — Telehealth: Payer: Self-pay | Admitting: Genetic Counselor

## 2014-09-09 DIAGNOSIS — Z1379 Encounter for other screening for genetic and chromosomal anomalies: Secondary | ICD-10-CM

## 2014-09-09 DIAGNOSIS — Z0000 Encounter for general adult medical examination without abnormal findings: Secondary | ICD-10-CM | POA: Insufficient documentation

## 2014-09-09 HISTORY — DX: Encounter for other screening for genetic and chromosomal anomalies: Z13.79

## 2014-09-09 NOTE — Telephone Encounter (Signed)
LM with male that I called and to please call back in regards to test results.  Left phone number.

## 2014-09-10 ENCOUNTER — Encounter: Payer: Self-pay | Admitting: Genetic Counselor

## 2014-09-10 ENCOUNTER — Telehealth: Payer: Self-pay | Admitting: Genetic Counselor

## 2014-09-10 NOTE — Progress Notes (Signed)
HPI: Mr. Heacock was previously seen in the [place] clinic due to a personal history of colon polyps and family history of cancer and concerns regarding a hereditary predisposition to cancer. Please refer to our prior cancer genetics clinic note for more information regarding Mr. Lecomte medical, social and family histories, and our assessment and recommendations, at the time. Mr. Thibault recent genetic test results were disclosed to him, as were recommendations warranted by these results. These results and recommendations are discussed in more detail below.  GENETIC TEST RESULTS: At the time of Mr. Beckles's visit, we recommended he pursue genetic testing of the ColoNext gene panel. The ColoNext gene panel offered by [facility] and includes sequencing and rearrangement analysis for the following 17 genes: APC, BMPR1A, CDH1, CHEK2, EPCAM, GREM1, MLH1, MSH2, MSH6, MUTYH, PMS2, POLD1, POLE, PTEN, SMAD4, STK11, and TP53.   The report date is September 08, 2014.  Testing was performed at [facility]. Genetic testing was normal, and did not reveal a deleterious mutation in these genes. The test report has been scanned into EPIC and is located under the Media tab.   We discussed with Mr. Marvin that since the current genetic testing is not perfect, it is possible there may be a gene mutation in one of these genes that current testing cannot detect, but that chance is small. We also discussed, that it is possible that another gene that has not yet been discovered, or that we have not yet tested, is responsible for the cancer diagnoses in the family, and it is, therefore, important to remain in touch with cancer genetics in the future so that we can continue to offer Mr. Willner the most up to date genetic testing.   CANCER SCREENING RECOMMENDATIONS: This result is reassuring and suggests that Mr. Agcaoili cancer was most likely not due to an inherited  predisposition associated with one of these genes. Most cancers happen by chance and this negative test, along with details of his family history, suggests that his cancer falls into this category. We, therefore, recommended he continue to follow the cancer management and screening guidelines provided by his oncology and primary providers.   RECOMMENDATIONS FOR FAMILY MEMBERS: Women in this family might be at some increased risk of developing cancer, over the general population risk, simply due to the family history of cancer. We recommended women in this family have a yearly mammogram beginning at age 45, an an annual clinical breast exam, and perform monthly breast self-exams. Women in this family should also have a gynecological exam as recommended by their primary provider. All family members should have a colonoscopy by age 45.  FOLLOW-UP: Lastly, we discussed with Mr. Majette that cancer genetics is a rapidly advancing field and it is possible that new genetic tests will be appropriate for him and/or his family members in the future. We encouraged him to remain in contact with cancer genetics on an annual basis so we can update his personal and family histories and let him know of advances in cancer genetics that may benefit this family.   Our contact number was provided. Mr.. Muckle's questions were answered to his satisfaction, and she knows he is welcome to call us at anytime with additional questions or concerns.   Roma Kayser, MS, [facility] Certified Genetic Counselor [email or web address]

## 2014-09-10 NOTE — Telephone Encounter (Signed)
Revealed negative genetic testing on the ColoNext panel testing.

## 2014-09-24 ENCOUNTER — Other Ambulatory Visit: Payer: Self-pay | Admitting: Cardiology

## 2014-09-24 ENCOUNTER — Other Ambulatory Visit: Payer: Self-pay | Admitting: Adult Health

## 2014-09-30 ENCOUNTER — Encounter: Payer: Self-pay | Admitting: Family Medicine

## 2014-09-30 ENCOUNTER — Ambulatory Visit (INDEPENDENT_AMBULATORY_CARE_PROVIDER_SITE_OTHER): Payer: 59 | Admitting: Family Medicine

## 2014-09-30 ENCOUNTER — Other Ambulatory Visit: Payer: Self-pay | Admitting: *Deleted

## 2014-09-30 VITALS — BP 122/76 | Ht 68.0 in | Wt 227.0 lb

## 2014-09-30 DIAGNOSIS — E119 Type 2 diabetes mellitus without complications: Secondary | ICD-10-CM

## 2014-09-30 DIAGNOSIS — E785 Hyperlipidemia, unspecified: Secondary | ICD-10-CM

## 2014-09-30 DIAGNOSIS — Z79899 Other long term (current) drug therapy: Secondary | ICD-10-CM

## 2014-09-30 DIAGNOSIS — I10 Essential (primary) hypertension: Secondary | ICD-10-CM

## 2014-09-30 LAB — POCT GLYCOSYLATED HEMOGLOBIN (HGB A1C): Hemoglobin A1C: 6.8

## 2014-09-30 NOTE — Progress Notes (Signed)
   Subjective:    Patient ID: Shaun [name], male    DOB: 07/31/1956, 59 y.o.   MRN: [id number]  Diabetes He presents for his follow-up diabetic visit. He has type 2 diabetes mellitus. He is compliant with treatment all of the time. He is following a diabetic diet. He participates in exercise intermittently. His breakfast blood glucose range is generally 130-140 mg/dl. His dinner blood glucose range is generally 110-130 mg/dl. He does not see a podiatrist.Eye exam is current.    Results for orders placed or performed in visit on 09/30/14  POCT glycosylated hemoglobin (Hb A1C)  Result Value Ref Range   Hemoglobin A1C 6.8    pt stopped glipizide because blood sugar was getting low.  No concerns.   Doesn't ck bp elsewhere  Morn numbers generally good, glipizide caused trouble and stopped  Mostly walking as exercise, most five out of seven da  Pt moving to night shift  Saw an eye doc in aug neg fro dr Gershon Crane  Sticks with meds faithfully  Patient was experiencing low sugars so he stopped the Glucotrol numbers still running good.  Compliant with blood pressure medication. No obvious side effects has cut down salt intake.  Maintains compliance with atrial fibrillation medication. Notes substantial exercise intolerance.  Compliant with lipid medicine. Watching fat intake. No side effects from medicine  Review of Systems Some exertional shortness of breath no headache no chest pain no back pain abdominal pain    Objective:   Physical Exam  Alert vital stable lungs clear heart rare rhythm H&T normal ankles without edema      Assessment & Plan:  Impression 1 type 2 diabetes good control #2 hypertension good control #3 hyperlipidemia status uncertain #4 atrial fibrillation discussed plan diet exercise discussed recheck in several months. Appropriate blood work further recommendations based results. WSL

## 2014-10-09 ENCOUNTER — Emergency Department (HOSPITAL_COMMUNITY): Payer: 59

## 2014-10-09 ENCOUNTER — Encounter (HOSPITAL_COMMUNITY): Payer: Self-pay | Admitting: Emergency Medicine

## 2014-10-09 ENCOUNTER — Emergency Department (HOSPITAL_COMMUNITY)
Admission: EM | Admit: 2014-10-09 | Discharge: 2014-10-09 | Disposition: A | Discharge status: Home / Self Care | Payer: 59 | Attending: Emergency Medicine | Admitting: Emergency Medicine

## 2014-10-09 DIAGNOSIS — E119 Type 2 diabetes mellitus without complications: Secondary | ICD-10-CM | POA: Insufficient documentation

## 2014-10-09 DIAGNOSIS — J45909 Unspecified asthma, uncomplicated: Secondary | ICD-10-CM | POA: Diagnosis not present

## 2014-10-09 DIAGNOSIS — G4700 Insomnia, unspecified: Secondary | ICD-10-CM | POA: Insufficient documentation

## 2014-10-09 DIAGNOSIS — E785 Hyperlipidemia, unspecified: Secondary | ICD-10-CM | POA: Insufficient documentation

## 2014-10-09 DIAGNOSIS — Z7901 Long term (current) use of anticoagulants: Secondary | ICD-10-CM | POA: Insufficient documentation

## 2014-10-09 DIAGNOSIS — I10 Essential (primary) hypertension: Secondary | ICD-10-CM | POA: Insufficient documentation

## 2014-10-09 DIAGNOSIS — I480 Paroxysmal atrial fibrillation: Secondary | ICD-10-CM | POA: Insufficient documentation

## 2014-10-09 DIAGNOSIS — G2581 Restless legs syndrome: Secondary | ICD-10-CM | POA: Insufficient documentation

## 2014-10-09 DIAGNOSIS — Z79899 Other long term (current) drug therapy: Secondary | ICD-10-CM | POA: Diagnosis not present

## 2014-10-09 DIAGNOSIS — R079 Chest pain, unspecified: Secondary | ICD-10-CM | POA: Diagnosis present

## 2014-10-09 DIAGNOSIS — R110 Nausea: Secondary | ICD-10-CM | POA: Diagnosis not present

## 2014-10-09 DIAGNOSIS — Z87891 Personal history of nicotine dependence: Secondary | ICD-10-CM | POA: Insufficient documentation

## 2014-10-09 DIAGNOSIS — Z86010 Personal history of colonic polyps: Secondary | ICD-10-CM | POA: Insufficient documentation

## 2014-10-09 DIAGNOSIS — I509 Heart failure, unspecified: Secondary | ICD-10-CM | POA: Diagnosis not present

## 2014-10-09 DIAGNOSIS — R51 Headache: Secondary | ICD-10-CM | POA: Insufficient documentation

## 2014-10-09 DIAGNOSIS — Z8711 Personal history of peptic ulcer disease: Secondary | ICD-10-CM | POA: Insufficient documentation

## 2014-10-09 DIAGNOSIS — Z7982 Long term (current) use of aspirin: Secondary | ICD-10-CM | POA: Insufficient documentation

## 2014-10-09 DIAGNOSIS — R519 Headache, unspecified: Secondary | ICD-10-CM

## 2014-10-09 DIAGNOSIS — I2510 Atherosclerotic heart disease of native coronary artery without angina pectoris: Secondary | ICD-10-CM | POA: Insufficient documentation

## 2014-10-09 LAB — CBC WITH DIFFERENTIAL/PLATELET
Basophils Absolute: 0 10*3/uL (ref 0.0–0.1)
Basophils Relative: 0 % (ref 0–1)
EOS PCT: 2 % (ref 0–5)
Eosinophils Absolute: 0.1 10*3/uL (ref 0.0–0.7)
HCT: 37.3 % — ABNORMAL LOW (ref 39.0–52.0)
Hemoglobin: 12.1 g/dL — ABNORMAL LOW (ref 13.0–17.0)
LYMPHS ABS: 2.2 10*3/uL (ref 0.7–4.0)
LYMPHS PCT: 25 % (ref 12–46)
MCH: 25.6 pg — ABNORMAL LOW (ref 26.0–34.0)
MCHC: 32.4 g/dL (ref 30.0–36.0)
MCV: 79 fL (ref 78.0–100.0)
MONO ABS: 0.6 10*3/uL (ref 0.1–1.0)
Monocytes Relative: 7 % (ref 3–12)
Neutro Abs: 5.9 10*3/uL (ref 1.7–7.7)
Neutrophils Relative %: 66 % (ref 43–77)
Platelets: 217 10*3/uL (ref 150–400)
RBC: 4.72 MIL/uL (ref 4.22–5.81)
RDW: 14.9 % (ref 11.5–15.5)
WBC: 8.9 10*3/uL (ref 4.0–10.5)

## 2014-10-09 LAB — BASIC METABOLIC PANEL
Anion gap: 8 (ref 5–15)
BUN: 13 mg/dL (ref 6–23)
CALCIUM: 8.8 mg/dL (ref 8.4–10.5)
CO2: 25 mmol/L (ref 19–32)
Chloride: 105 mmol/L (ref 96–112)
Creatinine, Ser: 0.98 mg/dL (ref 0.50–1.35)
GFR, EST NON AFRICAN AMERICAN: 89 mL/min — AB (ref >90)
Glucose, Bld: 123 mg/dL — ABNORMAL HIGH (ref 70–99)
Potassium: 4.1 mmol/L (ref 3.5–5.1)
SODIUM: 138 mmol/L (ref 135–145)

## 2014-10-09 LAB — TROPONIN I: Troponin I: <0.03 ng/mL (ref <0.031)

## 2014-10-09 LAB — BRAIN NATRIURETIC PEPTIDE: B NATRIURETIC PEPTIDE 5: 52 pg/mL (ref 0.0–100.0)

## 2014-10-09 NOTE — ED Notes (Signed)
Patient verbalizes understanding of discharge instructions, home care, and follow up care if needed. Patient ambulatory out of department at this time with spouse.

## 2014-10-09 NOTE — Discharge Instructions (Signed)
Chest Pain (Nonspecific) °It is often hard to give a specific diagnosis for the cause of chest pain. There is always a chance that your pain could be related to something serious, such as a heart attack or a blood clot in the lungs. You need to follow up with your health care provider for further evaluation. °CAUSES  °· Heartburn. °· Pneumonia or bronchitis. °· Anxiety or stress. °· Inflammation around your heart (pericarditis) or lung (pleuritis or pleurisy). °· A blood clot in the lung. °· A collapsed lung (pneumothorax). It can develop suddenly on its own (spontaneous pneumothorax) or from trauma to the chest. °· Shingles infection (herpes zoster virus). °The chest wall is composed of bones, muscles, and cartilage. Any of these can be the source of the pain. °· The bones can be bruised by injury. °· The muscles or cartilage can be strained by coughing or overwork. °· The cartilage can be affected by inflammation and become sore (costochondritis). °DIAGNOSIS  °Lab tests or other studies may be needed to find the cause of your pain. Your health care provider may have you take a test called an ambulatory electrocardiogram (ECG). An ECG records your heartbeat patterns over a 24-hour period. You may also have other tests, such as: °· Transthoracic echocardiogram (TTE). During echocardiography, sound waves are used to evaluate how blood flows through your heart. °· Transesophageal echocardiogram (TEE). °· Cardiac monitoring. This allows your health care provider to monitor your heart rate and rhythm in real time. °· Holter monitor. This is a portable device that records your heartbeat and can help diagnose heart arrhythmias. It allows your health care provider to track your heart activity for several days, if needed. °· Stress tests by exercise or by giving medicine that makes the heart beat faster. °TREATMENT  °· Treatment depends on what may be causing your chest pain. Treatment may include: °· Acid blockers for  heartburn. °· Anti-inflammatory medicine. °· Pain medicine for inflammatory conditions. °· Antibiotics if an infection is present. °· You may be advised to change lifestyle habits. This includes stopping smoking and avoiding alcohol, caffeine, and chocolate. °· You may be advised to keep your head raised (elevated) when sleeping. This reduces the chance of acid going backward from your stomach into your esophagus. °Most of the time, nonspecific chest pain will improve within 2-3 days with rest and mild pain medicine.  °HOME CARE INSTRUCTIONS  °· If antibiotics were prescribed, take them as directed. Finish them even if you start to feel better. °· For the next few days, avoid physical activities that bring on chest pain. Continue physical activities as directed. °· Do not use any tobacco products, including cigarettes, chewing tobacco, or electronic cigarettes. °· Avoid drinking alcohol. °· Only take medicine as directed by your health care provider. °· Follow your health care provider's suggestions for further testing if your chest pain does not go away. °· Keep any follow-up appointments you made. If you do not go to an appointment, you could develop lasting (chronic) problems with pain. If there is any problem keeping an appointment, call to reschedule. °SEEK MEDICAL CARE IF:  °· Your chest pain does not go away, even after treatment. °· You have a rash with blisters on your chest. °· You have a fever. °SEEK IMMEDIATE MEDICAL CARE IF:  °· You have increased chest pain or pain that spreads to your arm, neck, jaw, back, or abdomen. °· You have shortness of breath. °· You have an increasing cough, or you cough   up blood.  You have severe back or abdominal pain.  You feel nauseous or vomit.  You have severe weakness.  You faint.  You have chills. This is an emergency. Do not wait to see if the pain will go away. Get medical help at once. Call your local emergency services (911 in [place] Do not drive  yourself to the hospital. MAKE SURE YOU:   Understand these instructions.  Will watch your condition.  Will get help right away if you are not doing well or get worse. Document Released: 05/25/2005 Document Revised: 08/20/2013 Document Reviewed: 03/20/2008 [facility] Patient Information 2015 [place], [place]. This information is not intended to replace advice given to you by your health care provider. Make sure you discuss any questions you have with your health care provider.  Migraine Headache A migraine headache is an intense, throbbing pain on one or both sides of your head. A migraine can last for 30 minutes to several hours. CAUSES  The exact cause of a migraine headache is not always known. However, a migraine may be caused when nerves in the brain become irritated and release chemicals that cause inflammation. This causes pain. Certain things may also trigger migraines, such as:  Alcohol.  Smoking.  Stress.  Menstruation.  Aged cheeses.  Foods or drinks that contain nitrates, glutamate, aspartame, or tyramine.  Lack of sleep.  Chocolate.  Caffeine.  Hunger.  Physical exertion.  Fatigue.  Medicines used to treat chest pain (nitroglycerine), birth control pills, estrogen, and some blood pressure medicines. SIGNS AND SYMPTOMS  Pain on one or both sides of your head.  Pulsating or throbbing pain.  Severe pain that prevents daily activities.  Pain that is aggravated by any physical activity.  Nausea, vomiting, or both.  Dizziness.  Pain with exposure to bright lights, loud noises, or activity.  General sensitivity to bright lights, loud noises, or smells. Before you get a migraine, you may get warning signs that a migraine is coming (aura). An aura may include:  Seeing flashing lights.  Seeing bright spots, halos, or zigzag lines.  Having tunnel vision or blurred vision.  Having feelings of numbness or tingling.  Having trouble talking.  Having  muscle weakness. DIAGNOSIS  A migraine headache is often diagnosed based on:  Symptoms.  Physical exam.  A CT scan or MRI of your head. These imaging tests cannot diagnose migraines, but they can help rule out other causes of headaches. TREATMENT Medicines may be given for pain and nausea. Medicines can also be given to help prevent recurrent migraines.  HOME CARE INSTRUCTIONS  Only take over-the-counter or prescription medicines for pain or discomfort as directed by your health care provider. The use of long-term narcotics is not recommended.  Lie down in a dark, quiet room when you have a migraine.  Keep a journal to find out what may trigger your migraine headaches. For example, write down:  What you eat and drink.  How much sleep you get.  Any change to your diet or medicines.  Limit alcohol consumption.  Quit smoking if you smoke.  Get 7-9 hours of sleep, or as recommended by your health care provider.  Limit stress.  Keep lights dim if bright lights bother you and make your migraines worse. SEEK IMMEDIATE MEDICAL CARE IF:   Your migraine becomes severe.  You have a fever.  You have a stiff neck.  You have vision loss.  You have muscular weakness or loss of muscle control.  You start losing  your balance or have trouble walking.  You feel faint or pass out.  You have severe symptoms that are different from your first symptoms. MAKE SURE YOU:   Understand these instructions.  Will watch your condition.  Will get help right away if you are not doing well or get worse. Document Released: 08/15/2005 Document Revised: 12/30/2013 Document Reviewed: 04/22/2013 [facility] Patient Information 2015 [place], [place]. This information is not intended to replace advice given to you by your health care provider. Make sure you discuss any questions you have with your health care provider.

## 2014-10-09 NOTE — ED Notes (Signed)
Patient reports left-sided chest pain that started earlier today and has worsened. Describes pain as a heaviness. Also reports shortness of breath that worsens with exertion. States chest pain is intermittent. Also reports nausea and diaphoresis at times.

## 2014-10-09 NOTE — ED Provider Notes (Signed)
CSN: [id number]     Arrival date & time 10/09/14  2126 History  This chart was scribed for [employer]. Alvino Chapel, MD by Rayfield Citizen, ED Scribe. This patient was seen in room APA12/[facility]12 and the patient's care was started at 9:45 PM.    Chief Complaint  Patient presents with  . Chest Pain   Patient is a 59 y.o. male presenting with chest pain. The history is provided by the patient and the spouse. No language interpreter was used.  Chest Pain Associated symptoms: headache and nausea   Associated symptoms: no shortness of breath      HPI Comments: Shaun [name] is a 59 y.o. male with past medical history of paroxysmal afib, ASCVD, HTN, DM, HLD, CHF who presents to the Emergency Department complaining of headache beginning earlier this afternoon. He states he couldn't "get his eyes to focus" and felt generally unwell, so he called his son-in-law to take his blood pressure, which resulted 180/102 - he gets headaches with high blood pressure, but today's symptoms felt unusual to him.   Patient also reports his "afib was really out of whack," with a slight heaviness or pressure in his chest. He explains he is "normally in afib," but that the pressure has been mild and intermittent for the past several days. He also notes nausea.   He denies SOB, confusion. Wife denies abnormal behavior.   He is still on Eliquis; he denies recent medication changes. Last stress test one year PTA; all normal.   Past Medical History  Diagnosis Date  . Paroxysmal atrial fibrillation 10/2004    Onset in 10/2004; recurred 09/2008  . Arteriosclerotic cardiovascular disease (ASCVD)     Nonobstructive; cath in 2000: 30-40% mid LAD and proximal RCA;normal EF. stress nuclear in 2006-subtle inferoseptal hypoperfusion with reversibility; negative stress EKG; good exercise tolerance  . Hypertension   . Sinusitis   . Insomnia   . Obstructive sleep apnea 12/2009    01/26/2010 AHI 83/hr  . Diabetes mellitus without  complication   . PUD (peptic ulcer disease)     1980s  . Hyperlipidemia   . RLS (restless legs syndrome)   . Asthma   . CHF (congestive heart failure)   . Colon polyps     30 colon polyps found on first colonoscopy   Past Surgical History  Procedure Laterality Date  . Hernia repair  1986    Left inguinal  . Rotator cuff repair      Right  . Colonoscopy N/A 05/19/2014    Procedure: COLONOSCOPY;  Surgeon: Danie Binder, MD;  Location: [facility];  Service: Endoscopy;  Laterality: N/A;  10:45   Family History  Problem Relation Age of Onset  . Hypertension Mother   . Breast cancer Mother 26  . Heart attack Father   . Diabetes Brother   . Colon cancer Neg Hx   . Skin cancer Sister 53  . Brain cancer Maternal Uncle   . Cancer Maternal Uncle     NOS  . Breast cancer Cousin     maternal cousin dx <50   History  Substance Use Topics  . Smoking status: Former Smoker -- 0.25 packs/day for 32 years    Types: Cigarettes    Quit date: 07/04/2013  . Smokeless tobacco: Never Used  . Alcohol Use: No    Review of Systems  Respiratory: Negative for shortness of breath.   Cardiovascular: Positive for chest pain.  Gastrointestinal: Positive for nausea.  Neurological: Positive for  headaches.  Psychiatric/Behavioral: Negative for confusion.      Allergies  Review of patient's allergies indicates no known allergies.  Home Medications   Prior to Admission medications   Medication Sig Start Date End Date Taking? Authorizing Provider  albuterol-ipratropium (COMBIVENT) 18-103 MCG/ACT inhaler Inhale 2 puffs into the lungs every 6 (six) hours as needed. Patient taking differently: Inhale 2 puffs into the lungs every 6 (six) hours as needed for wheezing or shortness of breath.  04/01/13  Yes Kathyrn Drown, MD  apixaban (ELIQUIS) 5 MG TABS tablet Take 1 tablet (5 mg total) by mouth 2 (two) times daily. 05/27/14  Yes Arnoldo Lenis, MD  aspirin 81 MG tablet Take 81 mg by mouth every  evening.    Yes Historical Provider, MD  atorvastatin (LIPITOR) 40 MG tablet Take 1 tablet (40 mg total) by mouth daily. 05/07/14  Yes Arnoldo Lenis, MD  chlorthalidone (HYGROTON) 25 MG tablet Take 1 tablet (25 mg total) by mouth as needed (fluid retention). 05/07/14  Yes Arnoldo Lenis, MD  [place] 250 MCG tablet TAKE 1 TABLET BY MOUTH EVERY MORNING 08/26/14  Yes Arnoldo Lenis, MD  diltiazem [facility]) 360 MG 24 hr capsule TAKE 1 CAPSULE BY MOUTH EVERY DAY 02/11/14  Yes Lendon Colonel, NP  lisinopril (PRINIVIL,ZESTRIL) 5 MG tablet TAKE 1 TABLET BY MOUTH DAILY 09/24/14  Yes Arnoldo Lenis, MD  metFORMIN (GLUCOPHAGE) 500 MG tablet TAKE 2 TABLETS BY MOUTH TWICE DAILY WITH A MEAL 07/09/14  Yes Mikey Kirschner, MD  Multiple Vitamins-Minerals (MULTIVITAMIN WITH MINERALS) tablet Take 1 tablet by mouth daily.     Yes Historical Provider, MD  Omega-3 Fatty Acids (FISH OIL PO) Take 1 tablet by mouth daily.   Yes Historical Provider, MD  omeprazole (PRILOSEC) 20 MG capsule Take 20 mg by mouth daily as needed (Acid Reflux).   Yes Historical Provider, MD  rOPINIRole (REQUIP) 2 MG tablet TAKE 1 TABLET BY MOUTH EVERY NIGHT AT BEDTIME 06/30/14  Yes Mikey Kirschner, MD  sildenafil (VIAGRA) 100 MG tablet Take 1 tablet (100 mg total) by mouth daily as needed for erectile dysfunction. 05/07/14  Yes Arnoldo Lenis, MD  traZODone (DESYREL) 50 MG tablet TAKE 1 TABLET BY MOUTH EVERY NIGHT AT BEDTIME FOR 7 DAYS THEN TAKE 2 TABLETS BY MOUTH EVERY NIGHT AT BEDTIME Patient taking differently: TAKE TWO TABLETS BY MOUTH DAILY AT BEDTIME 08/18/14  Yes Mikey Kirschner, MD  sodium chloride (OCEAN) 0.65 % SOLN nasal spray Place 1 spray into both nostrils as needed for congestion.    Historical Provider, MD  triamcinolone cream (KENALOG) 0.1 % Apply 1 application topically 2 (two) times daily. Patient not taking: Reported on 10/09/2014 06/30/14   Mikey Kirschner, MD   BP 121/77 mmHg  Pulse 77  Temp(Src) 98.4 F (36.9  C) (Oral)  Resp 11  Ht 5\' 8"  (1.727 m)  Wt 227 lb (102.967 kg)  BMI 34.52 kg/m2  SpO2 98% Physical Exam  Constitutional: He is oriented to person, place, and time. He appears well-developed and well-nourished. No distress.  HENT:  [name]: Normocephalic and atraumatic.  Mouth/Throat: Oropharynx is clear and moist. No oropharyngeal exudate.  No tenderness on [name]  Eyes: EOM are normal. Pupils are equal, round, and reactive to light.  Neck: Normal range of motion. Neck supple.  Cardiovascular: Normal rate, regular rhythm and normal heart sounds.  Exam reveals no gallop and no friction rub.   No murmur heard. Irregular rhythm, regular  rate  Pulmonary/Chest: Effort normal. No respiratory distress. He has no wheezes. He has no rales.  Abdominal: Soft. There is no tenderness. There is no rebound and no guarding.  Musculoskeletal: Normal range of motion. He exhibits no edema.  Neurological: He is alert and oriented to person, place, and time.  Skin: Skin is warm and dry. No rash noted.  Psychiatric: He has a normal mood and affect. His behavior is normal.  Nursing note and vitals reviewed.   ED Course  Procedures   DIAGNOSTIC STUDIES: Oxygen Saturation is 99% on RA, normal by my interpretation.    COORDINATION OF CARE: 9:51 PM Discussed treatment plan with pt at bedside and pt agreed to plan.   Labs Review Labs Reviewed  CBC WITH DIFFERENTIAL/PLATELET - Abnormal; Notable for the following:    Hemoglobin 12.1 (*)    HCT 37.3 (*)    MCH 25.6 (*)    All other components within normal limits  BASIC METABOLIC PANEL - Abnormal; Notable for the following:    Glucose, Bld 123 (*)    GFR calc non Af Amer 89 (*)    All other components within normal limits  BRAIN NATRIURETIC PEPTIDE  TROPONIN I    Imaging Review Dg Chest 2 View  10/09/2014   CLINICAL DATA:  Left-sided chest pain and shortness of breath  EXAM: CHEST  2 VIEW  COMPARISON:  08/30/2013  FINDINGS: Cardiac shadow is again  mildly enlarged. The lungs are well aerated bilaterally. Elevation of the right hemidiaphragm is again seen. No sizable effusion is noted. No bony abnormality is seen.  IMPRESSION: No acute abnormality noted.   Electronically Signed   By: Inez Catalina M.D.   On: 10/09/2014 22:47   Ct [name] Wo Contrast  10/09/2014   CLINICAL DATA:  Headache, nausea, blurry vision, and high blood pressure. History of hypertension and diabetes.  EXAM: CT [name] WITHOUT CONTRAST  TECHNIQUE: Contiguous axial images were obtained from the base of the skull through the vertex without intravenous contrast.  COMPARISON:  None.  FINDINGS: Mild diffuse cerebral atrophy. Focal low-attenuation in the left thalamus suggesting old lacunar infarct. No mass effect or midline shift. No abnormal extra-axial fluid collections. Gray-white matter junctions are distinct. Basal cisterns are not effaced. No evidence of acute intracranial hemorrhage. No depressed skull fractures. Visualized paranasal sinuses and mastoid air cells are not opacified.  IMPRESSION: No acute intracranial abnormalities. Mild diffuse atrophy. Old lacunar infarct in the left thalamus.   Electronically Signed   By: Lucienne Capers M.D.   On: 10/09/2014 22:37     EKG Interpretation   Date/Time:  Thursday October 09 2014 21:54:15 EST Ventricular Rate:  82 PR Interval:    QRS Duration: 109 QT Interval:  339 QTC Calculation: 396 R Axis:   5 Text Interpretation:  Atrial fibrillation RSR' in V1 or V2, right VCD or  RVH arm leads no longer reversed Confirmed by Alvino Chapel  MD, Ovid Curd  [phone]) on 10/09/2014 11:09:02 PM      MDM   Final diagnoses:  Chest pain, unspecified chest pain type  Acute nonintractable headache, unspecified headache type   Patient with headache and chest discomfort. Patient feels much better on reexamination. Initial hypertension had improved. Had felt his heart racing. EKG reassuring. His had symptoms since around 1:00 today. Blood pressures  improved and may have been somewhat of the cause. His artery on anticoagulation for his A. fib. Will discharge home. he did have a negative stress test around a year  ago.  I personally performed the services described in this documentation, which was scribed in my presence. The recorded information has been reviewed and is accurate.       Jasper Riling. Alvino Chapel, MD 10/09/14 2333

## 2014-10-10 ENCOUNTER — Other Ambulatory Visit: Payer: Self-pay | Admitting: *Deleted

## 2014-10-10 ENCOUNTER — Telehealth: Payer: Self-pay | Admitting: *Deleted

## 2014-10-10 NOTE — Telephone Encounter (Signed)
Per Dr. Richardson Landry, sounds viral. Increase fluids, take OTC ibu/Tylenol and imodium. Return to ER if s/s worsen. Pt notified and verbalized understanding.

## 2014-10-10 NOTE — Telephone Encounter (Signed)
Pt states I'm "doing ok" BP 122/80  Did not check HR.States he stays in afib.Has HA and then BP rises to 180/sys,confirmed by EMS ,no changes in ED   Made fu apt to see Arnold Long NP on 10/13/14 at 1:30 pm

## 2014-10-10 NOTE — Telephone Encounter (Signed)
Pt seen in ER last night for chest pain and high BP.  Today, still having high BPs, HA, and body aches.

## 2014-10-10 NOTE — Telephone Encounter (Signed)
Pt was told to touch base with [place] today. He was seen in emergency room for hight BP and AFIB.

## 2014-10-10 NOTE — Telephone Encounter (Signed)
He just started to have diarrhea and feels warm, 15 mins ago. No cough.

## 2014-10-10 NOTE — Telephone Encounter (Signed)
Pt called his card this morn at ten a m with symptoms and was given appt mon. , calls here 2:30 pm--any fever? Andy cough? Any stomach symptoms??

## 2014-10-13 ENCOUNTER — Ambulatory Visit: Payer: 59 | Admitting: Adult Health

## 2014-10-14 ENCOUNTER — Encounter: Payer: Self-pay | Admitting: Adult Health

## 2014-10-14 ENCOUNTER — Ambulatory Visit (INDEPENDENT_AMBULATORY_CARE_PROVIDER_SITE_OTHER): Payer: 59 | Admitting: Adult Health

## 2014-10-14 VITALS — BP 122/86 | HR 93 | Ht 68.0 in | Wt 228.0 lb

## 2014-10-14 DIAGNOSIS — I2510 Atherosclerotic heart disease of native coronary artery without angina pectoris: Secondary | ICD-10-CM

## 2014-10-14 NOTE — Progress Notes (Deleted)
Name: Shaun [name]    DOB: [birth year]  Age: 59 y.o.  MR#: [id number]       PCP:  Rubbie Battiest, MD      Insurance: Payor: [employer] / Plan: [employer] OTHER / Product Type: *No Product type* /   CC:    Chief Complaint  Patient presents with  . Atrial Fibrillation    Paroxysmal  . Coronary Artery Disease    Nonobstructive  . Hypertension    VS Filed Vitals:   10/14/14 1353  BP: 122/86  Pulse: 93  Height: 5\' 8"  (1.727 m)  Weight: 228 lb (103.42 kg)  SpO2: 95%    Weights Current Weight  10/14/14 228 lb (103.42 kg)  10/09/14 227 lb (102.967 kg)  09/30/14 227 lb (102.967 kg)    Blood Pressure  BP Readings from Last 3 Encounters:  10/14/14 122/86  10/09/14 125/60  09/30/14 122/76     Admit date:  (Not on file) Last encounter with RMR:  09/24/2014   Allergy Review of patient's allergies indicates no known allergies.  Current Outpatient Prescriptions  Medication Sig Dispense Refill  . albuterol-ipratropium (COMBIVENT) 18-103 MCG/ACT inhaler Inhale 2 puffs into the lungs every 6 (six) hours as needed. (Patient taking differently: Inhale 2 puffs into the lungs every 6 (six) hours as needed for wheezing or shortness of breath. ) 14.7 g 5  . apixaban (ELIQUIS) 5 MG TABS tablet Take 1 tablet (5 mg total) by mouth 2 (two) times daily. 60 tablet 3  . aspirin 81 MG tablet Take 81 mg by mouth every evening.     Marland Kitchen atorvastatin (LIPITOR) 40 MG tablet Take 1 tablet (40 mg total) by mouth daily. 30 tablet 11  . chlorthalidone (HYGROTON) 25 MG tablet Take 1 tablet (25 mg total) by mouth as needed (fluid retention). 30 tablet 11  . DIGOX 250 MCG tablet TAKE 1 TABLET BY MOUTH EVERY MORNING 30 tablet 3  . diltiazem (TIAZAC) 360 MG 24 hr capsule TAKE 1 CAPSULE BY MOUTH EVERY DAY 30 capsule 9  . lisinopril (PRINIVIL,ZESTRIL) 5 MG tablet TAKE 1 TABLET BY MOUTH DAILY 90 tablet 1  . metFORMIN (GLUCOPHAGE) 500 MG tablet TAKE 2 TABLETS BY MOUTH TWICE DAILY WITH A MEAL 120 tablet 5   . Multiple Vitamins-Minerals (MULTIVITAMIN WITH MINERALS) tablet Take 1 tablet by mouth daily.      . Omega-3 Fatty Acids (FISH OIL PO) Take 1 tablet by mouth daily.    Marland Kitchen omeprazole (PRILOSEC) 20 MG capsule Take 20 mg by mouth daily as needed (Acid Reflux).    Marland Kitchen rOPINIRole (REQUIP) 2 MG tablet TAKE 1 TABLET BY MOUTH EVERY NIGHT AT BEDTIME 30 tablet 11  . sildenafil (VIAGRA) 100 MG tablet Take 1 tablet (100 mg total) by mouth daily as needed for erectile dysfunction. 6 tablet 6  . sodium chloride (OCEAN) 0.65 % SOLN nasal spray Place 1 spray into both nostrils as needed for congestion.    . traZODone (DESYREL) 50 MG tablet TAKE 1 TABLET BY MOUTH EVERY NIGHT AT BEDTIME FOR 7 DAYS THEN TAKE 2 TABLETS BY MOUTH EVERY NIGHT AT BEDTIME (Patient taking differently: TAKE TWO TABLETS BY MOUTH DAILY AT BEDTIME) 60 tablet 5  . triamcinolone cream (KENALOG) 0.1 % Apply 1 application topically 2 (two) times daily. 60 g 1  . [DISCONTINUED] diltiazem (CARDIZEM CD) 360 MG 24 hr capsule Take 1 capsule (360 mg total) by mouth every evening. 30 capsule 6   No current facility-administered medications for this visit.  Discontinued Meds:   There are no discontinued medications.  Patient Active Problem List   Diagnosis Date Noted  . Genetic testing 09/09/2014  . History of colonic polyps 08/11/2014  . Colon polyps   . Encounter for screening colonoscopy 04/30/2014  . Chest pain 08/30/2013  . Diabetes mellitus 02/09/2011  . Arteriosclerotic cardiovascular disease (ASCVD)   . Tobacco abuse, in remission   . Hypertension   . Hyperlipidemia LDL goal <100 02/23/2010  . Atrial fibrillation 02/23/2010  . Obstructive sleep apnea 12/27/2009    LABS    Component Value Date/Time   NA 138 10/09/2014 2159   NA 137 03/12/2014 0704   NA 138 08/29/2013 2242   K 4.1 10/09/2014 2159   K 4.2 03/12/2014 0704   K 3.9 08/29/2013 2242   CL 105 10/09/2014 2159   CL 102 03/12/2014 0704   CL 98 08/29/2013 2242   CO2  25 10/09/2014 2159   CO2 24 03/12/2014 0704   CO2 25 08/29/2013 2242   GLUCOSE 123* 10/09/2014 2159   GLUCOSE 137* 03/12/2014 0704   GLUCOSE 202* 08/29/2013 2242   BUN 13 10/09/2014 2159   BUN 22 03/12/2014 0704   BUN 15 08/29/2013 2242   CREATININE 0.98 10/09/2014 2159   CREATININE 0.81 03/12/2014 0704   CREATININE 0.78 08/29/2013 2242   CREATININE 0.73 03/16/2013 0835   CREATININE 0.78 12/19/2012 2242   CALCIUM 8.8 10/09/2014 2159   CALCIUM 9.0 03/12/2014 0704   CALCIUM 9.1 08/29/2013 2242   GFRNONAA 89* 10/09/2014 2159   GFRNONAA >90 08/29/2013 2242   GFRNONAA >90 12/19/2012 2242   GFRAA >90 10/09/2014 2159   GFRAA >90 08/29/2013 2242   GFRAA >90 12/19/2012 2242   CMP     Component Value Date/Time   NA 138 10/09/2014 2159   K 4.1 10/09/2014 2159   CL 105 10/09/2014 2159   CO2 25 10/09/2014 2159   GLUCOSE 123* 10/09/2014 2159   BUN 13 10/09/2014 2159   CREATININE 0.98 10/09/2014 2159   CREATININE 0.81 03/12/2014 0704   CALCIUM 8.8 10/09/2014 2159   PROT 6.4 03/12/2014 0704   ALBUMIN 4.2 03/12/2014 0704   AST 24 03/12/2014 0704   ALT 31 03/12/2014 0704   ALKPHOS 50 03/12/2014 0704   BILITOT 0.4 03/12/2014 0704   GFRNONAA 89* 10/09/2014 2159   GFRAA >90 10/09/2014 2159       Component Value Date/Time   WBC 8.9 10/09/2014 2159   WBC 8.3 08/29/2013 2242   WBC 11.2* 12/19/2012 2242   HGB 12.1* 10/09/2014 2159   HGB 13.4 08/29/2013 2242   HGB 14.0 12/19/2012 2242   HCT 37.3* 10/09/2014 2159   HCT 39.5 08/29/2013 2242   HCT 40.5 12/19/2012 2242   MCV 79.0 10/09/2014 2159   MCV 81.1 08/29/2013 2242   MCV 80.0 12/19/2012 2242    Lipid Panel     Component Value Date/Time   CHOL 125 03/12/2014 0704   TRIG 192* 03/12/2014 0704   HDL 29* 03/12/2014 0704   CHOLHDL 4.3 03/12/2014 0704   VLDL 38 03/12/2014 0704   LDLCALC 58 03/12/2014 0704    ABG No results found for: PHART, PCO2ART, PO2ART, HCO3, TCO2, ACIDBASEDEF, O2SAT   Lab Results  Component Value  Date   TSH 0.550 02/03/2011   BNP (last 3 results)  Recent Labs  10/09/14 2159  BNP 52.0    ProBNP (last 3 results) No results for input(s): PROBNP in the last 8760 hours.  Cardiac Panel (last 3 results)  No results for input(s): CKTOTAL, CKMB, TROPONINI, RELINDX in the last 72 hours.  Iron/TIBC/Ferritin/ %Sat No results found for: IRON, TIBC, FERRITIN, IRONPCTSAT   EKG Orders placed or performed during the hospital encounter of 10/09/14  . EKG 12-Lead  . EKG 12-Lead  . EKG 12-Lead  . EKG 12-Lead  . EKG     Prior Assessment and Plan Problem List as of 10/14/2014      Cardiovascular and Mediastinum   Atrial fibrillation   Last Assessment & Plan 06/07/2013 Office Visit Written 06/07/2013  4:41 PM by Lendon Colonel, NP    Rate is well controlled. He is a little stressed today from an event prior to coming in. Will not make any changes in his medications at this time.  Continue to watch salt intake. I have spent a good bit of time talking with him about salt intake.       Arteriosclerotic cardiovascular disease (ASCVD)   Last Assessment & Plan 10/02/2012 Office Visit Written 10/02/2012  4:26 PM by Lendon Colonel, NP    He offers no complaints of recurrent chest pain. Will concentrate on risk factor management. Most importantly smoking cessation.      Hypertension   Last Assessment & Plan 06/07/2013 Office Visit Written 06/07/2013  4:46 PM by Lendon Colonel, NP    Slightly elevated today.He will have extra doses of chlorthalidone today and tomorrow 25 mg each day for two days with potassium, as he still has evidence of fluid retention. He will follow up in 3 months unless symptomatic.        Respiratory   Obstructive sleep apnea     Digestive   Colon polyps     Endocrine   Diabetes mellitus   Last Assessment & Plan 09/07/2011 Office Visit Written 09/07/2011 11:09 AM by Yehuda Savannah, MD    Patient reports excellent control with CBGs consistently less than 130  and a recent A1c level of 6.6.        Other   Hyperlipidemia LDL goal <100   Last Assessment & Plan 10/02/2012 Office Visit Written 10/02/2012  4:24 PM by Lendon Colonel, NP    Labs are followed by Dr. Wolfgang Phoenix. He is advised to increase exercise and activity to lose weight to increase HDL level and decrease LDL.       Tobacco abuse, in remission   Last Assessment & Plan 06/07/2013 Office Visit Written 06/07/2013  4:47 PM by Lendon Colonel, NP    He has restarted smoking, and is unhappy about it. He is considering electronic cigarettes. I have encouraged him to do whatever he can to quit smoking.      Chest pain   Encounter for screening colonoscopy   Last Assessment & Plan 04/29/2014 Office Visit Edited 05/07/2014  3:11 PM by Orvil Feil, NP    59 year old male due for initial screening colonoscopy without any upper or lower concerning GI symptoms. On Eliquis for afib. Will be requesting approval from cardiology to hold X 3 days.   Proceed with colonoscopy with Dr. Oneida Alar in the near future. The risks, benefits, and alternatives have been discussed in detail with the patient. They state understanding and desire to proceed.  Likely hold Eliquis X 3 days; discuss with cardiology  ADDENDUM: ELIQUIS WILL BE HOLD 48 HOURS PER CARDIOLOGY      History of colonic polyps   Genetic testing       Imaging: Dg Chest 2 View  10/09/2014  CLINICAL DATA:  Left-sided chest pain and shortness of breath  EXAM: CHEST  2 VIEW  COMPARISON:  08/30/2013  FINDINGS: Cardiac shadow is again mildly enlarged. The lungs are well aerated bilaterally. Elevation of the right hemidiaphragm is again seen. No sizable effusion is noted. No bony abnormality is seen.  IMPRESSION: No acute abnormality noted.   Electronically Signed   By: Inez Catalina M.D.   On: 10/09/2014 22:47   Ct Head Wo Contrast  10/09/2014   CLINICAL DATA:  Headache, nausea, blurry vision, and high blood pressure. History of hypertension and  diabetes.  EXAM: CT HEAD WITHOUT CONTRAST  TECHNIQUE: Contiguous axial images were obtained from the base of the skull through the vertex without intravenous contrast.  COMPARISON:  None.  FINDINGS: Mild diffuse cerebral atrophy. Focal low-attenuation in the left thalamus suggesting old lacunar infarct. No mass effect or midline shift. No abnormal extra-axial fluid collections. Gray-white matter junctions are distinct. Basal cisterns are not effaced. No evidence of acute intracranial hemorrhage. No depressed skull fractures. Visualized paranasal sinuses and mastoid air cells are not opacified.  IMPRESSION: No acute intracranial abnormalities. Mild diffuse atrophy. Old lacunar infarct in the left thalamus.   Electronically Signed   By: Lucienne Capers M.D.   On: 10/09/2014 22:37

## 2014-10-14 NOTE — Assessment & Plan Note (Signed)
Currently controlled on medication regimen. Heart rate is a little here in the office. Continue watch and wait. He will have no changes in his medicines at this time to see him in 3 months or sooner if he is symptomatic.

## 2014-10-14 NOTE — Assessment & Plan Note (Signed)
Currently well controlled.  

## 2014-10-14 NOTE — Assessment & Plan Note (Signed)
We will consider repeat stress test should he have recurrence of symptoms. Evaluate for progression of CAD.

## 2014-10-14 NOTE — Progress Notes (Signed)
Cardiology Office Note   Date:  10/14/2014   ID:  Shaun [name], DOB 10-Aug-1956, MRN [id number]  PCP:  Rubbie Battiest, MD  Cardiologist:  Cloria Spring, NP   Chief Complaint  Patient presents with  . Atrial Fibrillation    Paroxysmal  . Coronary Artery Disease    Nonobstructive  . Hypertension      History of Present Illness: Shaun [name] is a 59 y.o. male who presents for ongoing assessment and management of paroxysmal atrial fibrillation, nonobstructive CAD per cath in 2000, with recent MPI in January 2015, negative for ischemia, hypertension, hyperlipidemia, and ongoing tobacco abuse. He was last seen by Dr. Harl Bowie in September 2000 seen. He was to continue eloquence, keep a BP log, his atorvastatin was changed to 40 mg..    The patient was seen in the emergency room on 10/09/2014, with complaints of chest pain. He states initially that he had a headache and could not get his eyes to focus, he was found to be hypertensive, he also complained "A. Fib, was out of whack" with heaviness in chest pressure. Lab data his chest x-ray and EKG were negative and unremarkable.  He is better today, uncertain what triggered the event. He has had a lot of stress at work, not sleeping well, but he is medically compliant. He denies any bleeding issues.his had no recurrence of rapid heart rate or elevated blood pressure sent to ER evaluation.    Past Medical History  Diagnosis Date  . Paroxysmal atrial fibrillation 10/2004    Onset in 10/2004; recurred 09/2008  . Arteriosclerotic cardiovascular disease (ASCVD)     Nonobstructive; cath in 2000: 30-40% mid LAD and proximal RCA;normal EF. stress nuclear in 2006-subtle inferoseptal hypoperfusion with reversibility; negative stress EKG; good exercise tolerance  . Hypertension   . Sinusitis   . Insomnia   . Obstructive sleep apnea 12/2009    01/26/2010 AHI 83/hr  . Diabetes mellitus without complication   . PUD (peptic ulcer  disease)     1980s  . Hyperlipidemia   . RLS (restless legs syndrome)   . Asthma   . CHF (congestive heart failure)   . Colon polyps     30 colon polyps found on first colonoscopy    Past Surgical History  Procedure Laterality Date  . Hernia repair  1986    Left inguinal  . Rotator cuff repair      Right  . Colonoscopy N/A 05/19/2014    Procedure: COLONOSCOPY;  Surgeon: Danie Binder, MD;  [facility]: [facility];  Service: Endoscopy;  Laterality: N/A;  10:45     Current Outpatient Prescriptions  Medication Sig Dispense Refill  . albuterol-ipratropium (COMBIVENT) 18-103 MCG/ACT inhaler Inhale 2 puffs into the lungs every 6 (six) hours as needed. (Patient taking differently: Inhale 2 puffs into the lungs every 6 (six) hours as needed for wheezing or shortness of breath. ) 14.7 g 5  . apixaban (ELIQUIS) 5 MG TABS tablet Take 1 tablet (5 mg total) by mouth 2 (two) times daily. 60 tablet 3  . aspirin 81 MG tablet Take 81 mg by mouth every evening.     Marland Kitchen atorvastatin (LIPITOR) 40 MG tablet Take 1 tablet (40 mg total) by mouth daily. 30 tablet 11  . chlorthalidone (HYGROTON) 25 MG tablet Take 1 tablet (25 mg total) by mouth as needed (fluid retention). 30 tablet 11  . DIGOX 250 MCG tablet TAKE 1 TABLET BY MOUTH EVERY MORNING 30 tablet  3  . diltiazem (TIAZAC) 360 MG 24 hr capsule TAKE 1 CAPSULE BY MOUTH EVERY DAY 30 capsule 9  . lisinopril (PRINIVIL,ZESTRIL) 5 MG tablet TAKE 1 TABLET BY MOUTH DAILY 90 tablet 1  . metFORMIN (GLUCOPHAGE) 500 MG tablet TAKE 2 TABLETS BY MOUTH TWICE DAILY WITH A MEAL 120 tablet 5  . Multiple Vitamins-Minerals (MULTIVITAMIN WITH MINERALS) tablet Take 1 tablet by mouth daily.      . Omega-3 Fatty Acids (FISH OIL PO) Take 1 tablet by mouth daily.    Marland Kitchen omeprazole (PRILOSEC) 20 MG capsule Take 20 mg by mouth daily as needed (Acid Reflux).    Marland Kitchen rOPINIRole (REQUIP) 2 MG tablet TAKE 1 TABLET BY MOUTH EVERY NIGHT AT BEDTIME 30 tablet 11  . sildenafil (VIAGRA) 100 MG  tablet Take 1 tablet (100 mg total) by mouth daily as needed for erectile dysfunction. 6 tablet 6  . sodium chloride (OCEAN) 0.65 % SOLN nasal spray Place 1 spray into both nostrils as needed for congestion.    . traZODone (DESYREL) 50 MG tablet TAKE 1 TABLET BY MOUTH EVERY NIGHT AT BEDTIME FOR 7 DAYS THEN TAKE 2 TABLETS BY MOUTH EVERY NIGHT AT BEDTIME (Patient taking differently: TAKE TWO TABLETS BY MOUTH DAILY AT BEDTIME) 60 tablet 5  . triamcinolone cream (KENALOG) 0.1 % Apply 1 application topically 2 (two) times daily. 60 g 1  . [DISCONTINUED] diltiazem (CARDIZEM CD) 360 MG 24 hr capsule Take 1 capsule (360 mg total) by mouth every evening. 30 capsule 6   No current facility-administered medications for this visit.    Allergies:   Review of patient's allergies indicates no known allergies.    Social History:  The patient  reports that he quit smoking about 15 months ago. His smoking use included Cigarettes. He started smoking about 44 years ago. He has a 8 pack-year smoking history. He has never used smokeless tobacco. He reports that he does not drink alcohol or use illicit drugs.   Family History:  The patient's family history includes Brain cancer in his maternal uncle; Breast cancer in his cousin; Breast cancer (age of onset: 51) in his mother; Cancer in his maternal uncle; Diabetes in his brother; Heart attack in his father; Hypertension in his mother; Skin cancer (age of onset: 80) in his sister. There is no history of Colon cancer.    ROS:  Please see the history of present illness.   All other systems are reviewed and negative.    PHYSICAL EXAM: VS:  BP 122/86 mmHg  Pulse 93  Ht 5\' 8"  (1.727 m)  Wt 228 lb (103.42 kg)  BMI 34.68 kg/m2  SpO2 95% , BMI Body mass index is 34.68 kg/(m^2). GEN: Well nourished, well developed, in no acute distress HEENT: normal Neck: no JVD, carotid bruits, or masses Cardiac: IRRR; no murmurs, rubs, or gallops,no edema  Respiratory:  clear to  auscultation bilaterally, normal work of breathing GI: soft, nontender, nondistended, + BS. obese MS: no deformity or atrophy Skin: warm and dry, no rash Neuro:  Strength and sensation are intact Psych: euthymic mood, full affect  Recent Labs: 03/12/2014: ALT 31 10/09/2014: B Natriuretic Peptide 52.0; BUN 13; Creatinine 0.98; Hemoglobin 12.1*; Platelets 217; Potassium 4.1; Sodium 138    Lipid Panel    Component Value Date/Time   CHOL 125 03/12/2014 0704   TRIG 192* 03/12/2014 0704   HDL 29* 03/12/2014 0704   CHOLHDL 4.3 03/12/2014 0704   VLDL 38 03/12/2014 0704   LDLCALC 58 03/12/2014  8088      Wt Readings from Last 3 Encounters:  10/14/14 228 lb (103.42 kg)  10/09/14 227 lb (102.967 kg)  09/30/14 227 lb (102.967 kg)      Other studies Reviewed: Additional studies/ records that were reviewed today include: records from the emergency room, including labs Review of the above records demonstrates: all within normal limits. Blood pressure returned to normal.   ASSESSMENT AND PLAN:  1.  Atrial fibrillation with RVR: Heart rate is mildly elevated, in the 90s in this office visit. I reviewed his medications. He is on digoxin 25 mcg daily, diltiazem 360 mg daily for rate control. He remains on Eliquis 5 mg twice a day. He does have obstructive sleep apnea, but has been using his CPAP. I will not make any changes today. Uncertain etiology of his symptoms at time of ER visit. This could be exterior stresses at work, family issues, and lack of sleep. We will continue to monitor this as he is return to normal and is not symptomatic. He is advised to take his medicines daily, and let us know if he has recurrence of symptoms. May need to consider amiodarone or even low-dose metoprolol. We will see him again in 3 months unless he is symptomatic.  2. Hypertension: Excellent control of blood pressure here in the office. It was noted on review of ER records. His blood pressure was extremely  elevated.he is to keep track of this at home, and make sure he uses his CPAP and avoid salty foods. He has recurrence of rapid heart rhythm, or hypertension, would consider repeating stress test to evaluate for ischemic component as etiology.   Current medicines are reviewed at length with the patient today.    Disposition:   FU with 3 months  Signed, Jory Sims, NP  10/14/2014 2:11 PM    [facility] [place], [place], [place]  [address] Phone: [phone]; Fax: [phone]

## 2014-10-14 NOTE — Patient Instructions (Signed)
Your physician recommends that you schedule a follow-up appointment in:  3 months    Your physician recommends that you continue on your current medications as directed. Please refer to the Current Medication list given to you today.     Thank you for choosing [facility] !   

## 2014-11-26 ENCOUNTER — Other Ambulatory Visit: Payer: Self-pay | Admitting: Cardiology

## 2014-12-21 ENCOUNTER — Other Ambulatory Visit: Payer: Self-pay | Admitting: Adult Health

## 2014-12-25 ENCOUNTER — Ambulatory Visit: Payer: 59 | Admitting: Family Medicine

## 2014-12-25 ENCOUNTER — Other Ambulatory Visit: Payer: Self-pay | Admitting: Cardiology

## 2014-12-26 ENCOUNTER — Ambulatory Visit (INDEPENDENT_AMBULATORY_CARE_PROVIDER_SITE_OTHER): Payer: 59 | Admitting: Family Medicine

## 2014-12-26 ENCOUNTER — Encounter: Payer: Self-pay | Admitting: Family Medicine

## 2014-12-26 VITALS — BP 120/82 | Ht 68.0 in | Wt 220.2 lb

## 2014-12-26 DIAGNOSIS — E119 Type 2 diabetes mellitus without complications: Secondary | ICD-10-CM | POA: Diagnosis not present

## 2014-12-26 DIAGNOSIS — E785 Hyperlipidemia, unspecified: Secondary | ICD-10-CM

## 2014-12-26 DIAGNOSIS — I10 Essential (primary) hypertension: Secondary | ICD-10-CM | POA: Diagnosis not present

## 2014-12-26 LAB — POCT GLYCOSYLATED HEMOGLOBIN (HGB A1C): Hemoglobin A1C: 7

## 2014-12-26 MED ORDER — OMEPRAZOLE 20 MG PO CPDR: 20.0000 mg | DELAYED_RELEASE_CAPSULE | Freq: Every day | ORAL | Status: DC | PRN

## 2014-12-26 MED ORDER — HYDROCODONE-ACETAMINOPHEN 5-325 MG PO TABS: 0.5000 | ORAL_TABLET | Freq: Every day | ORAL | Status: DC | PRN

## 2014-12-26 MED ORDER — ROPINIROLE HCL 2 MG PO TABS: ORAL_TABLET | ORAL | Status: DC

## 2014-12-26 MED ORDER — TRAZODONE HCL 50 MG PO TABS: 100.0000 mg | ORAL_TABLET | Freq: Every day | ORAL | Status: DC

## 2014-12-26 MED ORDER — METFORMIN HCL 500 MG PO TABS: ORAL_TABLET | ORAL | Status: DC

## 2014-12-26 NOTE — Progress Notes (Signed)
   Subjective:    Patient ID: Shaun [name], male    DOB: 1955-11-10, 59 y.o.   MRN: [id number]  Diabetes He presents for his follow-up diabetic visit. He has type 2 diabetes mellitus. Risk factors for coronary artery disease include diabetes mellitus and hypertension. Current diabetic treatment includes oral agent (monotherapy). He is compliant with treatment all of the time. He does not see a podiatrist.Eye exam is current.   Having nausea in the early morn while at work on the night shift  Has led to troubles with sleep  Maybe four hrs sleep at night  (during the day),  Chronic a fib under decent control . Claims compliance with medication. Slight lightheadedness with medicine.  Compliant blood pressure medicine. Watching salt intake.   Review of Systems No headache no chest pain no back pain no abdominal pain no change in bowel habits    Objective:   Physical Exam  Alert no apparent distress vital stable blood pressure good on repeat HEENT normal. Neck supple. Lungs clear. Heart rhythm irregular but good control ankles trace edema some venous stasis changes      Assessment & Plan:  Impression 1 hypertension good control #2 type 2 diabetes recent control not as good. A1c still within good limits at 7.0 discussed a true fibrillation good control #4 fatigue and insomnia with nightshifts discussed #5 hyperlipidemia status uncertain plan maintain all medication. Appropriate blood work. Diet exercise discussed. Follow-up in several months. WSL

## 2015-01-04 ENCOUNTER — Other Ambulatory Visit: Payer: Self-pay | Admitting: Family Medicine

## 2015-01-05 ENCOUNTER — Other Ambulatory Visit: Payer: Self-pay | Admitting: Adult Health

## 2015-01-06 ENCOUNTER — Other Ambulatory Visit: Payer: Self-pay | Admitting: Cardiology

## 2015-01-30 ENCOUNTER — Encounter: Payer: Self-pay | Admitting: Cardiology

## 2015-01-30 ENCOUNTER — Ambulatory Visit (INDEPENDENT_AMBULATORY_CARE_PROVIDER_SITE_OTHER): Payer: 59 | Admitting: Cardiology

## 2015-01-30 VITALS — BP 104/64 | HR 81 | Ht 68.0 in | Wt 212.0 lb

## 2015-01-30 DIAGNOSIS — E785 Hyperlipidemia, unspecified: Secondary | ICD-10-CM

## 2015-01-30 DIAGNOSIS — I10 Essential (primary) hypertension: Secondary | ICD-10-CM

## 2015-01-30 DIAGNOSIS — I4891 Unspecified atrial fibrillation: Secondary | ICD-10-CM | POA: Diagnosis not present

## 2015-01-30 NOTE — Patient Instructions (Signed)
Your physician wants you to follow-up in:  6 months with Dr.Branch You will receive a reminder letter in the mail two months in advance. If you don't receive a letter, please call our office to schedule the follow-up appointment.  Stop Asprin   Thanks for choosing [facility]!!!

## 2015-01-30 NOTE — Progress Notes (Signed)
Clinical Summary Shaun [name] is a 59 y.o.male seen today for follow up of the following medical problems.   1. Paroxysmal afib  - denies any recent palpitations. Denies any bleeding issues on eliquis  2. Non-obstructive CAD  - mild non-obstructive cath in 2000, recent MPI Jan 2015 without any ischemia  - has not had any recent chest pain   3. HTN  - checks at home regularly, typically 120/80s - compliant with meds   4. Tobacco  - quit Nov 1, has not restarted  5. Hyperlipidemia  - compliant with statin - has labs pending with Dr Wolfgang Phoenix Past Medical History  Diagnosis Date  . Paroxysmal atrial fibrillation 10/2004    Onset in 10/2004; recurred 09/2008  . Arteriosclerotic cardiovascular disease (ASCVD)     Nonobstructive; cath in 2000: 30-40% mid LAD and proximal RCA;normal EF. stress nuclear in 2006-subtle inferoseptal hypoperfusion with reversibility; negative stress EKG; good exercise tolerance  . Hypertension   . Sinusitis   . Insomnia   . Obstructive sleep apnea 12/2009    01/26/2010 AHI 83/hr  . Diabetes mellitus without complication   . PUD (peptic ulcer disease)     1980s  . Hyperlipidemia   . RLS (restless legs syndrome)   . Asthma   . CHF (congestive heart failure)   . Colon polyps     30 colon polyps found on first colonoscopy     No Known Allergies   Current Outpatient Prescriptions  Medication Sig Dispense Refill  . albuterol-ipratropium (COMBIVENT) 18-103 MCG/ACT inhaler Inhale 2 puffs into the lungs every 6 (six) hours as needed. (Patient taking differently: Inhale 2 puffs into the lungs every 6 (six) hours as needed for wheezing or shortness of breath. ) 14.7 g 5  . apixaban (ELIQUIS) 5 MG TABS tablet Take 1 tablet (5 mg total) by mouth 2 (two) times daily. 60 tablet 3  . aspirin 81 MG tablet Take 81 mg by mouth every evening.     Marland Kitchen atorvastatin (LIPITOR) 40 MG tablet Take 1 tablet (40 mg total) by mouth daily. 30 tablet 11  .  chlorthalidone (HYGROTON) 25 MG tablet Take 1 tablet (25 mg total) by mouth as needed (fluid retention). 30 tablet 11  . DIGOX 250 MCG tablet TAKE 1 TABLET BY MOUTH EVERY MORNING 30 tablet 6  . HYDROcodone-acetaminophen (NORCO/VICODIN) 5-325 MG per tablet Take 0.5-1 tablets by mouth daily as needed. 30 tablet 0  . lisinopril (PRINIVIL,ZESTRIL) 5 MG tablet TAKE 1 TABLET BY MOUTH DAILY 90 tablet 1  . metFORMIN (GLUCOPHAGE) 500 MG tablet TAKE 2 TABLETS BY MOUTH TWICE DAILY WITH A MEAL 120 tablet 0  . Multiple Vitamins-Minerals (MULTIVITAMIN WITH MINERALS) tablet Take 1 tablet by mouth daily.      . Omega-3 Fatty Acids (FISH OIL PO) Take 1 tablet by mouth daily.    Marland Kitchen omeprazole (PRILOSEC) 20 MG capsule Take 1 capsule (20 mg total) by mouth daily as needed (Acid Reflux). 30 capsule 5  . rOPINIRole (REQUIP) 2 MG tablet TAKE 1 TABLET BY MOUTH EVERY NIGHT AT BEDTIME 30 tablet 5  . sodium chloride (OCEAN) 0.65 % SOLN nasal spray Place 1 spray into both nostrils as needed for congestion.    . TAZTIA XT 360 MG 24 hr capsule TAKE 1 CAPSULE BY MOUTH EVERY DAY 30 capsule 6  . traZODone (DESYREL) 50 MG tablet Take 2 tablets (100 mg total) by mouth at bedtime. 60 tablet 5  . triamcinolone cream (KENALOG) 0.1 % Apply  1 application topically 2 (two) times daily. 60 g 1  . VIAGRA 100 MG tablet TAKE 1 TABLET BY MOUTH EVERY DAY AS NEEDED FOR ERECTILE DYSFUNCTION 6 tablet 3  . [DISCONTINUED] diltiazem (CARDIZEM CD) 360 MG 24 hr capsule Take 1 capsule (360 mg total) by mouth every evening. 30 capsule 6   No current facility-administered medications for this visit.     Past Surgical History  Procedure Laterality Date  . Hernia repair  1986    Left inguinal  . Rotator cuff repair      Right  . Colonoscopy N/A 05/19/2014    Procedure: COLONOSCOPY;  Surgeon: Danie Binder, MD;  Location: [facility];  Service: Endoscopy;  Laterality: N/A;  10:45     No Known Allergies    Family History  Problem Relation  Age of Onset  . Hypertension Mother   . Breast cancer Mother 81  . Heart attack Father   . Diabetes Brother   . Colon cancer Neg Hx   . Skin cancer Sister 60  . Brain cancer Maternal Uncle   . Cancer Maternal Uncle     NOS  . Breast cancer Cousin     maternal cousin dx <50     Social History Shaun [name] reports that he quit smoking about 18 months ago. His smoking use included Cigarettes. He started smoking about 44 years ago. He has a 8 pack-year smoking history. He has never used smokeless tobacco. Shaun [name] reports that he does not drink alcohol.   Review of Systems CONSTITUTIONAL: No weight loss, fever, chills, weakness or fatigue.  HEENT: Eyes: No visual loss, blurred vision, double vision or yellow sclerae.No hearing loss, sneezing, congestion, runny nose or sore throat.  SKIN: No rash or itching.  CARDIOVASCULAR: per HPI RESPIRATORY: No shortness of breath, cough or sputum.  GASTROINTESTINAL: No anorexia, nausea, vomiting or diarrhea. No abdominal pain or blood.  GENITOURINARY: No burning on urination, no polyuria NEUROLOGICAL: No headache, dizziness, syncope, paralysis, ataxia, numbness or tingling in the extremities. No change in bowel or bladder control.  MUSCULOSKELETAL: No muscle, back pain, joint pain or stiffness.  LYMPHATICS: No enlarged nodes. No history of splenectomy.  PSYCHIATRIC: No history of depression or anxiety.  ENDOCRINOLOGIC: No reports of sweating, cold or heat intolerance. No polyuria or polydipsia.  Marland Kitchen   Physical Examination There were no vitals filed for this visit. Filed Weights   01/30/15 0806  Weight: 212 lb (96.163 kg)    Gen: resting comfortably, no acute distress HEENT: no scleral icterus, pupils equal round and reactive, no palptable cervical adenopathy,  CV: irreg, no m/r/g, no JVD Resp: Clear to auscultation bilaterally GI: abdomen is soft, non-tender, non-distended, normal bowel sounds, no hepatosplenomegaly MSK: extremities  are warm, no edema.  Skin: warm, no rash Neuro:  no focal deficits Psych: appropriate affect   Diagnostic Studies  Jan 2015 MPI  IMPRESSION: 1. Normal Lexiscan Cardiolite stress test. 2. No evidence of ischemia or scar. 3. Normal left ventricular systolic function, calculated LVEF 62%.   Assessment and Plan  1. Paroxysmal afib  -no current symptoms - continue current meds including eliquis   2. HTN  - at goal, continue current meds  3. Hyperlipidemia  - f/u upcoming panel with pcp, continue current statin    F/u 6 months    Arnoldo Lenis, M.D.

## 2015-02-17 ENCOUNTER — Other Ambulatory Visit: Payer: Self-pay | Admitting: Family Medicine

## 2015-03-06 ENCOUNTER — Telehealth: Payer: Self-pay | Admitting: Family Medicine

## 2015-03-06 MED ORDER — HYDROCODONE-ACETAMINOPHEN 5-325 MG PO TABS: 0.5000 | ORAL_TABLET | Freq: Every day | ORAL | Status: DC | PRN

## 2015-03-06 NOTE — Telephone Encounter (Signed)
Patient may have one refill keep appointment with Dr. Richardson Landry

## 2015-03-06 NOTE — Telephone Encounter (Signed)
Patient has appt scheduled 03/27/15

## 2015-03-06 NOTE — Telephone Encounter (Signed)
HYDROcodone-acetaminophen (NORCO/VICODIN) 5-325 MG per tablet  Pt needs refill on this med please has upcoming appt but will be out before the appt

## 2015-03-06 NOTE — Telephone Encounter (Signed)
Rx up front for patient pick up. Patient notified. 

## 2015-03-08 LAB — LIPID PANEL
Chol/HDL Ratio: 5.8 ratio units — ABNORMAL HIGH (ref 0.0–5.0)
Cholesterol, Total: 163 mg/dL (ref 100–199)
HDL: 28 mg/dL — AB (ref >39)
LDL Calculated: 102 mg/dL — ABNORMAL HIGH (ref 0–99)
Triglycerides: 165 mg/dL — ABNORMAL HIGH (ref 0–149)
VLDL Cholesterol Cal: 33 mg/dL (ref 5–40)

## 2015-03-08 LAB — HEPATIC FUNCTION PANEL
ALK PHOS: 58 IU/L (ref 39–117)
ALT: 31 IU/L (ref 0–44)
AST: 21 IU/L (ref 0–40)
Albumin: 4.4 g/dL (ref 3.5–5.5)
BILIRUBIN TOTAL: 0.2 mg/dL (ref 0.0–1.2)
Bilirubin, Direct: 0.08 mg/dL (ref 0.00–0.40)
Total Protein: 6.7 g/dL (ref 6.0–8.5)

## 2015-03-27 ENCOUNTER — Ambulatory Visit (INDEPENDENT_AMBULATORY_CARE_PROVIDER_SITE_OTHER): Payer: 59 | Admitting: Family Medicine

## 2015-03-27 VITALS — BP 122/82 | Ht 68.0 in | Wt 223.2 lb

## 2015-03-27 DIAGNOSIS — E785 Hyperlipidemia, unspecified: Secondary | ICD-10-CM | POA: Diagnosis not present

## 2015-03-27 DIAGNOSIS — E119 Type 2 diabetes mellitus without complications: Secondary | ICD-10-CM

## 2015-03-27 DIAGNOSIS — I482 Chronic atrial fibrillation, unspecified: Secondary | ICD-10-CM

## 2015-03-27 DIAGNOSIS — I10 Essential (primary) hypertension: Secondary | ICD-10-CM

## 2015-03-27 LAB — POCT GLYCOSYLATED HEMOGLOBIN (HGB A1C): Hemoglobin A1C: 6.4

## 2015-03-27 NOTE — Progress Notes (Signed)
   Subjective:    Patient ID: Shaun [name], male    DOB: February 14, 1956, 59 y.o.   MRN: [id number]  Diabetes He presents for his follow-up diabetic visit. He has type 2 diabetes mellitus. Risk factors for coronary artery disease include diabetes mellitus, dyslipidemia, obesity and hypertension. Current diabetic treatment includes oral agent (monotherapy). He is compliant with treatment all of the time. His weight is stable. He is following a diabetic diet. He has not had a previous visit with a dietitian. He does not see a podiatrist.Eye exam current: appt end of august.    Results for orders placed or performed in visit on 12/26/14  Lipid panel  Result Value Ref Range   Cholesterol, Total 163 100 - 199 mg/dL   Triglycerides 165 (H) 0 - 149 mg/dL   HDL 28 (L) >39 mg/dL   VLDL Cholesterol Cal 33 5 - 40 mg/dL   LDL Calculated 102 (H) 0 - 99 mg/dL   Chol/HDL Ratio 5.8 (H) 0.0 - 5.0 ratio units  Hepatic function panel  Result Value Ref Range   Total Protein 6.7 6.0 - 8.5 g/dL   Albumin 4.4 3.5 - 5.5 g/dL   Bilirubin Total 0.2 0.0 - 1.2 mg/dL   Bilirubin, Direct 0.08 0.00 - 0.40 mg/dL   Alkaline Phosphatase 58 39 - 117 IU/L   AST 21 0 - 40 IU/L   ALT 31 0 - 44 IU/L  POCT glycosylated hemoglobin (Hb A1C)  Result Value Ref Range   Hemoglobin A1C 7    Sleeping off and on, and a moving target  BP numbers good with the bp.  134 over 84  Sleep apnea doing well with the machine  Compliant with blood pressure medicine. No obvious side effects. Watching salt intake. Next  Compliant with lipid medicines. Watching fat intake. No obvious side effects.  Ongoing fatigue and tiredness. Him difficulty getting used to nightshifts. Uses trazodone still. Definitely seems to help.   Glu's morn numbers usually around 120 or 130 ish Review of Systems No headache no chest pain no back pain abdominal pain no change about habits no blood in stool    Objective:   Physical Exam  Alert vitals  stable H&T normal. Lungs clear. Heart rhythm irregular but good control ankles without edema.  C diabetic foot exam    Assessment & Plan:  Impression 1 hypertension good control #2 type 2 diabetes good control #3 hyperlipidemia discussed #4 asthma stable #5 A. fib stable plan recheck in 3 months. A1c then plus wellness exam. WSL

## 2015-03-30 ENCOUNTER — Encounter: Payer: Self-pay | Admitting: Gastroenterology

## 2015-04-07 ENCOUNTER — Other Ambulatory Visit: Payer: Self-pay | Admitting: Cardiology

## 2015-04-23 ENCOUNTER — Other Ambulatory Visit: Payer: Self-pay | Admitting: Adult Health

## 2015-05-06 ENCOUNTER — Other Ambulatory Visit: Payer: Self-pay | Admitting: Cardiology

## 2015-05-13 ENCOUNTER — Telehealth: Payer: Self-pay | Admitting: Family Medicine

## 2015-05-13 NOTE — Telephone Encounter (Signed)
Needing Rx for HYDROcodone-acetaminophen (NORCO/VICODIN) 5-325 MG per tablet

## 2015-05-14 ENCOUNTER — Other Ambulatory Visit: Payer: Self-pay | Admitting: *Deleted

## 2015-05-14 MED ORDER — HYDROCODONE-ACETAMINOPHEN 5-325 MG PO TABS: 0.5000 | ORAL_TABLET | Freq: Every day | ORAL | Status: DC | PRN

## 2015-05-14 NOTE — Telephone Encounter (Signed)
Last seen 7.29.16.

## 2015-05-14 NOTE — Telephone Encounter (Signed)
Ok times one 

## 2015-05-14 NOTE — Telephone Encounter (Signed)
Script ready. Pt notified.  

## 2015-05-20 LAB — HM DIABETES EYE EXAM

## 2015-05-21 ENCOUNTER — Encounter: Payer: Self-pay | Admitting: *Deleted

## 2015-05-21 ENCOUNTER — Other Ambulatory Visit: Payer: Self-pay | Admitting: Cardiology

## 2015-06-02 ENCOUNTER — Other Ambulatory Visit: Payer: Self-pay | Admitting: Family Medicine

## 2015-06-04 ENCOUNTER — Encounter: Payer: Self-pay | Admitting: Gastroenterology

## 2015-06-04 ENCOUNTER — Ambulatory Visit (INDEPENDENT_AMBULATORY_CARE_PROVIDER_SITE_OTHER): Payer: Self-pay | Admitting: Gastroenterology

## 2015-06-04 VITALS — BP 123/72 | HR 88 | Temp 97.6°F | Ht 68.0 in | Wt 222.2 lb

## 2015-06-04 DIAGNOSIS — K635 Polyp of colon: Secondary | ICD-10-CM

## 2015-06-04 NOTE — Progress Notes (Signed)
  Shaun [name] is a 59 y.o. male presenting today with a history of multiple (>20) simple adenomas removed in Sept 2015. He is due for a 1 year surveillance now. In the interim, he has seen a Dietitian and found to have negative genetic testing.   He tells me today that he is not able to pursue a colonoscopy until December. He has absolutely no GI complaints. For this reason, we will cancel his office visit today and consider triage for his next colonoscopy. However, he is on Eliquis due to a history of afib, This was held for 48 hours prior to last procedure. Will discuss with Dr. Oneida Alar triage vs office visit in December for 1 year surveillance colonoscopy.

## 2015-06-04 NOTE — Progress Notes (Signed)
CC'ED TO PCP 

## 2015-06-09 ENCOUNTER — Ambulatory Visit (INDEPENDENT_AMBULATORY_CARE_PROVIDER_SITE_OTHER): Payer: 59 | Admitting: Family Medicine

## 2015-06-09 ENCOUNTER — Encounter: Payer: Self-pay | Admitting: Family Medicine

## 2015-06-09 VITALS — BP 134/82 | Temp 98.8°F | Ht 68.0 in | Wt 224.1 lb

## 2015-06-09 DIAGNOSIS — R0600 Dyspnea, unspecified: Secondary | ICD-10-CM | POA: Diagnosis not present

## 2015-06-09 NOTE — Progress Notes (Signed)
   Subjective:    Patient ID: Shaun [name], male    DOB: 1956-03-31, 59 y.o.   MRN: [id number]  Shortness of Breath This is a new problem. The current episode started yesterday. Associated symptoms include leg swelling.   Started [employer], felt sob. yest aft hae a lot of swelling both ankles  Worsened at work  h a , felt bad, du n energy  Patient in today for edema to bilateral lower extremities with c/o sob. Onset of symptoms yesterday.   No sig wheezing    Good headache  No sig ahciness elsewhere  Patient states no other concerns this visit.  Review of Systems  Respiratory: Positive for shortness of breath.   Cardiovascular: Positive for leg swelling.       Objective:   Physical Exam Alert no apparent distress vital stable. Lungs completely clear heart irregular in rhythm but good control rate ankles trace to 1+ edema       Assessment & Plan:  Impression headache diminished energy since of shortness of breath lungs however crystal-clear. Does not appear to be frank CHF rationale discussed plan maintain same hydrochlorothiazide as needed symptom care discussed follow-up as scheduled

## 2015-06-10 NOTE — Progress Notes (Signed)
Reviewed with Dr. Oneida Alar. May triage for colonoscopy in December. HE IS ON ELIQUIS. THIS WILL NEED TO BE HELD X 48 HOURS PRIOR>

## 2015-06-10 NOTE — Progress Notes (Signed)
Noted. On my list to schedule for Dec.

## 2015-06-18 ENCOUNTER — Other Ambulatory Visit: Payer: Self-pay | Admitting: Family Medicine

## 2015-06-18 ENCOUNTER — Encounter: Payer: Self-pay | Admitting: Family Medicine

## 2015-06-18 ENCOUNTER — Ambulatory Visit (INDEPENDENT_AMBULATORY_CARE_PROVIDER_SITE_OTHER): Payer: 59 | Admitting: Family Medicine

## 2015-06-18 VITALS — BP 122/78 | Ht 68.0 in | Wt 221.2 lb

## 2015-06-18 DIAGNOSIS — E119 Type 2 diabetes mellitus without complications: Secondary | ICD-10-CM | POA: Diagnosis not present

## 2015-06-18 DIAGNOSIS — Z125 Encounter for screening for malignant neoplasm of prostate: Secondary | ICD-10-CM | POA: Diagnosis not present

## 2015-06-18 DIAGNOSIS — I4819 Other persistent atrial fibrillation: Secondary | ICD-10-CM

## 2015-06-18 DIAGNOSIS — Z23 Encounter for immunization: Secondary | ICD-10-CM | POA: Diagnosis not present

## 2015-06-18 DIAGNOSIS — E785 Hyperlipidemia, unspecified: Secondary | ICD-10-CM

## 2015-06-18 DIAGNOSIS — I481 Persistent atrial fibrillation: Secondary | ICD-10-CM | POA: Diagnosis not present

## 2015-06-18 DIAGNOSIS — Z0000 Encounter for general adult medical examination without abnormal findings: Secondary | ICD-10-CM

## 2015-06-18 MED ORDER — HYDROCODONE-ACETAMINOPHEN 5-325 MG PO TABS: 0.5000 | ORAL_TABLET | Freq: Every day | ORAL | Status: DC | PRN

## 2015-06-18 NOTE — Progress Notes (Signed)
   Subjective:    Patient ID: Shaun [name], male    DOB: 29-Mar-1956, 59 y.o.   MRN: [id number]  HPI The patient comes in today for a wellness visit.  Felt sob  A review of their health history was completed.  A review of medications was also completed.  Any needed refills; no  Eating habits: trying to eat good  Falls/  MVA accidents in past few months: none  Regular exercise: yes  Results for orders placed or performed in visit on 05/21/15  HM DIABETES EYE EXAM  Result Value Ref Range   HM Diabetic Eye Exam No Retinopathy No Retinopathy    Specialist pt sees on regular basis: dr branch twice a year  Preventative health issues were discussed.   Additional concerns: problems with right leg,lasct couple months and , sharp in nature and uncomfortable, took hard shot to ant thight a couple yrs ago. Swelled up still huritng at tie mes  Also ongoing frustration regarding atrial fibrillation. Compliant with medications. Has multiple questions about the nature of and long-term challenges with    Review of Systems  Constitutional: Negative for fever, activity change and appetite change.  HENT: Negative for congestion and rhinorrhea.   Eyes: Negative for discharge.  Respiratory: Negative for cough and wheezing.   Cardiovascular: Negative for chest pain.  Gastrointestinal: Negative for vomiting, abdominal pain and blood in stool.  Genitourinary: Negative for frequency and difficulty urinating.  Musculoskeletal: Negative for neck pain.  Skin: Negative for rash.  Allergic/Immunologic: Negative for environmental allergies and food allergies.  Neurological: Negative for weakness and headaches.  Psychiatric/Behavioral: Negative for agitation.  All other systems reviewed and are negative.      Objective:   Physical Exam  Constitutional: He appears well-developed and well-nourished.  HENT:  Head: Normocephalic and atraumatic.  Right Ear: External ear normal.  Left Ear:  External ear normal.  Nose: Nose normal.  Mouth/Throat: Oropharynx is clear and moist.  Eyes: EOM are normal. Pupils are equal, round, and reactive to light.  Neck: Normal range of motion. Neck supple. No thyromegaly present.  Cardiovascular: Normal rate and normal heart sounds.   No murmur heard. Irregular rhythm in decent control as far as rate  Pulmonary/Chest: Effort normal and breath sounds normal. No respiratory distress. He has no wheezes.  Abdominal: Soft. Bowel sounds are normal. He exhibits no distension and no mass. There is no tenderness.  Genitourinary: Penis normal.  Musculoskeletal: Normal range of motion. He exhibits no edema.  Right anterior thigh some pain to deep palpation skin sensation completely normal hip and knee exam within normal limits  Lymphadenopathy:    He has no cervical adenopathy.  Neurological: He is alert. He exhibits normal muscle tone.  Skin: Skin is warm and dry. No erythema.  Psychiatric: He has a normal mood and affect. His behavior is normal. Judgment normal.  Vitals reviewed.         Assessment & Plan:  Impression #1 wellness exam #2 atrial fibrillation at least half a dozen questions answered in regards this #3 right mid thigh pain nonspecific does not appear to be neuralgia paresthetica rather deep muscular pain discussed #3 type 2 diabetes plan appropriate blood work. Flu shot today. Diet exercise discussed. Follow-up as scheduled in several months [facility]

## 2015-06-20 LAB — MICROALBUMIN / CREATININE URINE RATIO
Creatinine, Urine: 122.9 mg/dL
MICROALB/CREAT RATIO: 4.3 mg/g{creat} (ref 0.0–30.0)
Microalbumin, Urine: 5.3 ug/mL

## 2015-06-20 LAB — HEPATIC FUNCTION PANEL
ALT: 28 IU/L (ref 0–44)
AST: 22 IU/L (ref 0–40)
Albumin: 4.5 g/dL (ref 3.5–5.5)
Alkaline Phosphatase: 64 IU/L (ref 39–117)
BILIRUBIN, DIRECT: 0.09 mg/dL (ref 0.00–0.40)
Bilirubin Total: 0.3 mg/dL (ref 0.0–1.2)
TOTAL PROTEIN: 6.8 g/dL (ref 6.0–8.5)

## 2015-06-20 LAB — LIPID PANEL
Chol/HDL Ratio: 6 ratio units — ABNORMAL HIGH (ref 0.0–5.0)
Cholesterol, Total: 163 mg/dL (ref 100–199)
HDL: 27 mg/dL — ABNORMAL LOW (ref >39)
LDL Calculated: 99 mg/dL (ref 0–99)
TRIGLYCERIDES: 186 mg/dL — AB (ref 0–149)
VLDL Cholesterol Cal: 37 mg/dL (ref 5–40)

## 2015-06-20 LAB — PSA: Prostate Specific Ag, Serum: 1 ng/mL (ref 0.0–4.0)

## 2015-06-21 ENCOUNTER — Encounter: Payer: Self-pay | Admitting: Family Medicine

## 2015-06-30 ENCOUNTER — Ambulatory Visit (INDEPENDENT_AMBULATORY_CARE_PROVIDER_SITE_OTHER): Payer: 59 | Admitting: Adult Health

## 2015-06-30 ENCOUNTER — Encounter: Payer: Self-pay | Admitting: Adult Health

## 2015-06-30 VITALS — BP 158/80 | HR 79 | Ht 68.0 in | Wt 224.0 lb

## 2015-06-30 DIAGNOSIS — R072 Precordial pain: Secondary | ICD-10-CM | POA: Diagnosis not present

## 2015-06-30 DIAGNOSIS — I2510 Atherosclerotic heart disease of native coronary artery without angina pectoris: Secondary | ICD-10-CM | POA: Diagnosis not present

## 2015-06-30 DIAGNOSIS — Z79899 Other long term (current) drug therapy: Secondary | ICD-10-CM

## 2015-06-30 DIAGNOSIS — I4819 Other persistent atrial fibrillation: Secondary | ICD-10-CM

## 2015-06-30 DIAGNOSIS — I481 Persistent atrial fibrillation: Secondary | ICD-10-CM

## 2015-06-30 DIAGNOSIS — I10 Essential (primary) hypertension: Secondary | ICD-10-CM | POA: Diagnosis not present

## 2015-06-30 MED ORDER — NITROGLYCERIN 0.4 MG SL SUBL: 0.4000 mg | SUBLINGUAL_TABLET | SUBLINGUAL | Status: DC | PRN

## 2015-06-30 NOTE — Patient Instructions (Signed)
Your physician recommends that you schedule a follow-up appointment in: with Jory Sims NP after tests   Take Nitroglycerine as directed DO NOT TAKE VIAGRA WITH NITROGLYCERINE   Nitroglycerin sublingual tablets What is this medicine? NITROGLYCERIN (nye troe GLI ser in) is a type of vasodilator. It relaxes blood vessels, increasing the blood and oxygen supply to your heart. This medicine is used to relieve chest pain caused by angina. It is also used to prevent chest pain before activities like climbing stairs, going outdoors in cold weather, or sexual activity. This medicine may be used for other purposes; ask your health care provider or pharmacist if you have questions. What should I tell my health care provider before I take this medicine? They need to know if you have any of these conditions: -anemia -head injury, recent stroke, or bleeding in the brain -liver disease -previous heart attack -an unusual or allergic reaction to nitroglycerin, other medicines, foods, dyes, or preservatives -pregnant or trying to get pregnant -breast-feeding How should I use this medicine? Take this medicine by mouth as needed. At the first sign of an angina attack (chest pain or tightness) place one tablet under your tongue. You can also take this medicine 5 to 10 minutes before an event likely to produce chest pain. Follow the directions on the prescription label. Let the tablet dissolve under the tongue. Do not swallow whole. Replace the dose if you accidentally swallow it. It will help if your mouth is not dry. Saliva around the tablet will help it to dissolve more quickly. Do not eat or drink, smoke or chew tobacco while a tablet is dissolving. If you are not better within 5 minutes after taking ONE dose of nitroglycerin, call 9-1-1 immediately to seek emergency medical care. Do not take more than 3 nitroglycerin tablets over 15 minutes. If you take this medicine often to relieve symptoms of angina,  your doctor or health care professional may provide you with different instructions to manage your symptoms. If symptoms do not go away after following these instructions, it is important to call 9-1-1 immediately. Do not take more than 3 nitroglycerin tablets over 15 minutes. Talk to your pediatrician regarding the use of this medicine in children. Special care may be needed. Overdosage: If you think you have taken too much of this medicine contact a poison control center or emergency room at once. NOTE: This medicine is only for you. Do not share this medicine with others. What if I miss a dose? This does not apply. This medicine is only used as needed. What may interact with this medicine? Do not take this medicine with any of the following medications: -certain migraine medicines like ergotamine and dihydroergotamine (DHE) -medicines used to treat erectile dysfunction like sildenafil, tadalafil, and vardenafil -riociguat This medicine may also interact with the following medications: -alteplase -aspirin -heparin -medicines for high blood pressure -medicines for mental depression -other medicines used to treat angina -phenothiazines like chlorpromazine, mesoridazine, prochlorperazine, thioridazine This list may not describe all possible interactions. Give your health care provider a list of all the medicines, herbs, non-prescription drugs, or dietary supplements you use. Also tell them if you smoke, drink alcohol, or use illegal drugs. Some items may interact with your medicine. What should I watch for while using this medicine? Tell your doctor or health care professional if you feel your medicine is no longer working. Keep this medicine with you at all times. Sit or lie down when you take your medicine to prevent falling  if you feel dizzy or faint after using it. Try to remain calm. This will help you to feel better faster. If you feel dizzy, take several deep breaths and lie down with  your feet propped up, or bend forward with your head resting between your knees. You may get drowsy or dizzy. Do not drive, use machinery, or do anything that needs mental alertness until you know how this drug affects you. Do not stand or sit up quickly, especially if you are an older patient. This reduces the risk of dizzy or fainting spells. Alcohol can make you more drowsy and dizzy. Avoid alcoholic drinks. Do not treat yourself for coughs, colds, or pain while you are taking this medicine without asking your doctor or health care professional for advice. Some ingredients may increase your blood pressure. What side effects may I notice from receiving this medicine? Side effects that you should report to your doctor or health care professional as soon as possible: -blurred vision -dry mouth -skin rash -sweating -the feeling of extreme pressure in the head -unusually weak or tired Side effects that usually do not require medical attention (report to your doctor or health care professional if they continue or are bothersome): -flushing of the face or neck -headache -irregular heartbeat, palpitations -nausea, vomiting This list may not describe all possible side effects. Call your doctor for medical advice about side effects. You may report side effects to FDA at 1-800-FDA-1088. Where should I keep my medicine? Keep out of the reach of children. Store at room temperature between 20 and 25 degrees C (68 and 77 degrees F). Store in [facility]. Protect from light and moisture. Keep tightly closed. Throw away any unused medicine after the expiration date. NOTE: This sheet is a summary. It may not cover all possible information. If you have questions about this medicine, talk to your doctor, pharmacist, or health care provider.    2016, Elsevier/Gold Standard. (2013-06-13 17:57:36)  Your physician recommends that you return for lab work in: CBC,BMET    Your physician has requested  that you have an echocardiogram. Echocardiography is a painless test that uses sound waves to create images of your heart. It provides your doctor with information about the size and shape of your heart and how well your heart's chambers and valves are working. This procedure takes approximately one hour. There are no restrictions for this procedure.    Your physician has requested that you have a lexiscan myoview. For further information please visit [email or web address]. Please follow instruction sheet, as given.      Thank you for choosing [facility] !

## 2015-06-30 NOTE — Progress Notes (Signed)
Cardiology Office Note   Date:  06/30/2015   ID:  Shaun [name], DOB 06-Aug-1956, MRN [id number]  PCP:  Shaun Hillier, MD  Cardiologist: Shaun Spring, NP   No chief complaint on file.     History of Present Illness: Shaun [name] is a 59 y.o. male who presents for ongoing assessment and management of PAF on Elquis, , CHADS VASC Score of 4, non-obstructive CAD, Hypertension and hyperlipidemia. He comes today with increasing DOE, exertional chest pain and fatigue. He states that he can do minimal exertion he begins to have pain and has to stop and rest. This is becoming more frequent. He is medically compliant.   Past Medical History  Diagnosis Date  . Paroxysmal atrial fibrillation ([place]) 10/2004    Onset in 10/2004; recurred 09/2008  . Arteriosclerotic cardiovascular disease (ASCVD)     Nonobstructive; cath in 2000: 30-40% mid LAD and proximal RCA;normal EF. stress nuclear in 2006-subtle inferoseptal hypoperfusion with reversibility; negative stress EKG; good exercise tolerance  . Hypertension   . Sinusitis   . Insomnia   . Obstructive sleep apnea 12/2009    01/26/2010 AHI 83/hr  . Diabetes mellitus without complication ([place])   . PUD (peptic ulcer disease)     1980s  . Hyperlipidemia   . RLS (restless legs syndrome)   . Asthma   . CHF (congestive heart failure) ([place])   . Colon polyps     30 colon polyps found on first colonoscopy    Past Surgical History  Procedure Laterality Date  . Hernia repair  1986    Left inguinal  . Rotator cuff repair      Right  . Colonoscopy N/A 05/19/2014    Dr. Barnie [name] diverticulosis/moderate external hemorrhoids, >20 simple adenomas. Genetic screening negative.      Current Outpatient Prescriptions  Medication Sig Dispense Refill  . albuterol-ipratropium (COMBIVENT) 18-103 MCG/ACT inhaler Inhale 2 puffs into the lungs every 6 (six) hours as needed. (Patient taking differently: Inhale 2 puffs into the lungs every 6 (six)  hours as needed for wheezing or shortness of breath. ) 14.7 g 5  . atorvastatin (LIPITOR) 40 MG tablet Take 1 tablet (40 mg total) by mouth daily. 30 tablet 11  . chlorthalidone (HYGROTON) 25 MG tablet TAKE 1 TABLET BY MOUTH AS NEEDED FOR FLUID RETENTION 30 tablet 3  . DIGOX 250 MCG tablet TAKE 1 TABLET BY MOUTH EVERY MORNING 30 tablet 6  . ELIQUIS 5 MG TABS tablet TAKE 1 TABLET BY MOUTH TWICE DAILY 60 tablet 3  . HYDROcodone-acetaminophen (NORCO/VICODIN) 5-325 MG tablet Take 0.5-1 tablets by mouth daily as needed. 30 tablet 0  . lisinopril (PRINIVIL,ZESTRIL) 5 MG tablet TAKE 1 TABLET BY MOUTH DAILY 90 tablet 3  . metFORMIN (GLUCOPHAGE) 500 MG tablet TAKE 2 TABLETS BY MOUTH TWICE DAILY WITH A MEAL 120 tablet 0  . Multiple Vitamins-Minerals (MULTIVITAMIN WITH MINERALS) tablet Take 1 tablet by mouth daily.      . Omega-3 Fatty Acids (FISH OIL PO) Take 1 tablet by mouth daily.    Marland Kitchen omeprazole (PRILOSEC) 20 MG capsule TAKE 1 CAPSULE(20 MG) BY MOUTH DAILY AS NEEDED FOR ACID REFLUX 30 capsule 5  . rOPINIRole (REQUIP) 2 MG tablet TAKE 1 TABLET BY MOUTH EVERY NIGHT AT BEDTIME 30 tablet 5  . sodium chloride (OCEAN) 0.65 % SOLN nasal spray Place 1 spray into both nostrils as needed for congestion.    . TAZTIA XT 360 MG 24 hr capsule TAKE 1 CAPSULE BY  MOUTH EVERY DAY 30 capsule 6  . traZODone (DESYREL) 50 MG tablet Take 2 tablets (100 mg total) by mouth at bedtime. 60 tablet 5  . triamcinolone cream (KENALOG) 0.1 % Apply 1 application topically 2 (two) times daily. 60 g 1  . VIAGRA 100 MG tablet TAKE 1 TABLET BY MOUTH EVERY DAY AS NEEDED FOR ERECTILE DYSFUNCTION 6 tablet 1  . [DISCONTINUED] diltiazem (CARDIZEM CD) 360 MG 24 hr capsule Take 1 capsule (360 mg total) by mouth every evening. 30 capsule 6   No current facility-administered medications for this visit.    Allergies:   Review of patient's allergies indicates no known allergies.    Social History:  The patient  reports that he quit smoking  about 1 years ago. His smoking use included Cigarettes. He started smoking about 44 years ago. He has a 8 pack-year smoking history. He has never used smokeless tobacco. He reports that he does not drink alcohol or use illicit drugs.   Family History:  The patient's family history includes Brain cancer in his maternal uncle; Breast cancer in his cousin; Breast cancer (age of onset: 67) in his mother; Cancer in his maternal uncle; Diabetes in his brother; Heart attack in his father; Hypertension in his mother; Skin cancer (age of onset: 40) in his sister. There is no history of Colon cancer.    ROS: All other systems are reviewed and negative. Unless otherwise mentioned in H&P    PHYSICAL EXAM: VS:  There were no vitals taken for this visit. , BMI There is no weight on file to calculate BMI. GEN: Well nourished, well developed, in no acute distress HEENT: normal Neck: no JVD, carotid bruits, or masses Cardiac: IRRR; no murmurs, rubs, or gallops,no edema  Respiratory:  clear to auscultation bilaterally, normal work of breathing GI: soft, nontender, nondistended, + BS MS: no deformity or atrophy Skin: warm and dry, no rash Neuro:  Strength and sensation are intact Psych: euthymic mood, full affect   EKG:  The ekg ordered today demonstrates Atrial fibrillation rate of 94 bpm with non-specific ST changes inferior. Lateral.    Recent Labs: 10/09/2014: B Natriuretic Peptide 52.0; BUN 13; Creatinine, Ser 0.98; Hemoglobin 12.1*; Platelets 217; Potassium 4.1; Sodium 138 06/19/2015: ALT 28    Lipid Panel    Component Value Date/Time   CHOL 163 06/19/2015 0810   CHOL 125 03/12/2014 0704   TRIG 186* 06/19/2015 0810   HDL 27* 06/19/2015 0810   HDL 29* 03/12/2014 0704   CHOLHDL 6.0* 06/19/2015 0810   CHOLHDL 4.3 03/12/2014 0704   VLDL 38 03/12/2014 0704   LDLCALC 99 06/19/2015 0810   LDLCALC 58 03/12/2014 0704      Wt Readings from Last 3 Encounters:  06/18/15 221 lb 3.2 oz (100.336  kg)  06/09/15 224 lb 2 oz (101.662 kg)  06/04/15 222 lb 3.2 oz (100.789 kg)      Other studies Reviewed: Additional studies/ records that were reviewed today include: Echo and stress test in January of 2015 Review of the above records demonstrates: Normal stress test, Echo with LVEF of 70% and LVH.    ASSESSMENT AND PLAN:  1. Chest Pain: Most recent cardiac cath in 2000 demonstrated non-obstructive CAD. With CVRF of diabetes, hypercholesterolemia, hypertension, obesity, I will repeat his stress test and echo. He is given Rx for NTG. If chest pain returns he is to take NTG and call EMS to be transported to ER. EKG does show some non-specific T wave abnormalities. He  may need repeat cath if he has positive ischemic testing.   2. Hypertension: States its been more difficult to control. This may be from progressive ischemia. Will continue current medications for now. Check BMET for kidney function  3. Atrial fib: Heart rate is controlled on diltiazem. He remains on Eliquis without complaints of bleeding. Will check CBC to assess for anemia.   4. Obesity: He is unable to be more active due to chest discomfort. One ischemic testing is completed, we will recommend increased activity if tests are negative.    Current medicines are reviewed at length with the patient today.    Labs/ tests ordered today include: BMET, CBC, Lexiscan Cardiolite and Echocardiogram.  No orders of the defined types were placed in this encounter.     Disposition:   FU with 2 weeks post tests.    Signed, Jory Sims, NP  06/30/2015 11:56 AM    [facility] [address] [place], [place] [address] Phone: [phone]; Fax: [phone]

## 2015-06-30 NOTE — Progress Notes (Signed)
Name: Shaun [name]    DOB: 15-Nov-1955  Age: 59 y.o.  MR#: [id number]       PCP:  Mickie Hillier, MD      Insurance: Payor: Onnie Boer / Plan: [employer] OTHER / Product Type: *No Product type* /   CC:    Chief Complaint  Patient presents with  . Atrial Fibrillation  . Hypertension    VS Filed Vitals:   06/30/15 1455  BP: 158/80  Pulse: 79  Height: 5\' 8"  (1.727 m)  Weight: 224 lb (101.606 kg)  SpO2: 97%    Weights Current Weight  06/30/15 224 lb (101.606 kg)  06/18/15 221 lb 3.2 oz (100.336 kg)  06/09/15 224 lb 2 oz (101.662 kg)    Blood Pressure  BP Readings from Last 3 Encounters:  06/30/15 158/80  06/18/15 122/78  06/09/15 134/82     Admit date:  (Not on file) Last encounter with RMR:  04/23/2015   Allergy Review of patient's allergies indicates no known allergies.  Current Outpatient Prescriptions  Medication Sig Dispense Refill  . albuterol-ipratropium (COMBIVENT) 18-103 MCG/ACT inhaler Inhale 2 puffs into the lungs every 6 (six) hours as needed. (Patient taking differently: Inhale 2 puffs into the lungs every 6 (six) hours as needed for wheezing or shortness of breath. ) 14.7 g 5  . atorvastatin (LIPITOR) 40 MG tablet Take 1 tablet (40 mg total) by mouth daily. 30 tablet 11  . chlorthalidone (HYGROTON) 25 MG tablet TAKE 1 TABLET BY MOUTH AS NEEDED FOR FLUID RETENTION 30 tablet 3  . DIGOX 250 MCG tablet TAKE 1 TABLET BY MOUTH EVERY MORNING 30 tablet 6  . ELIQUIS 5 MG TABS tablet TAKE 1 TABLET BY MOUTH TWICE DAILY 60 tablet 3  . HYDROcodone-acetaminophen (NORCO/VICODIN) 5-325 MG tablet Take 0.5-1 tablets by mouth daily as needed. 30 tablet 0  . lisinopril (PRINIVIL,ZESTRIL) 5 MG tablet TAKE 1 TABLET BY MOUTH DAILY 90 tablet 3  . metFORMIN (GLUCOPHAGE) 500 MG tablet TAKE 2 TABLETS BY MOUTH TWICE DAILY WITH A MEAL 120 tablet 0  . Multiple Vitamins-Minerals (MULTIVITAMIN WITH MINERALS) tablet Take 1 tablet by mouth daily.      . Omega-3 Fatty Acids  (FISH OIL PO) Take 1 tablet by mouth daily.    Marland Kitchen omeprazole (PRILOSEC) 20 MG capsule TAKE 1 CAPSULE(20 MG) BY MOUTH DAILY AS NEEDED FOR ACID REFLUX 30 capsule 5  . rOPINIRole (REQUIP) 2 MG tablet TAKE 1 TABLET BY MOUTH EVERY NIGHT AT BEDTIME 30 tablet 5  . sodium chloride (OCEAN) 0.65 % SOLN nasal spray Place 1 spray into both nostrils as needed for congestion.    . TAZTIA XT 360 MG 24 hr capsule TAKE 1 CAPSULE BY MOUTH EVERY DAY 30 capsule 6  . traZODone (DESYREL) 50 MG tablet Take 2 tablets (100 mg total) by mouth at bedtime. 60 tablet 5  . triamcinolone cream (KENALOG) 0.1 % Apply 1 application topically 2 (two) times daily. 60 g 1  . VIAGRA 100 MG tablet TAKE 1 TABLET BY MOUTH EVERY DAY AS NEEDED FOR ERECTILE DYSFUNCTION 6 tablet 1  . [DISCONTINUED] diltiazem (CARDIZEM CD) 360 MG 24 hr capsule Take 1 capsule (360 mg total) by mouth every evening. 30 capsule 6   No current facility-administered medications for this visit.    Discontinued Meds:   There are no discontinued medications.  Patient Active Problem List   Diagnosis Date Noted  . Genetic testing 09/09/2014  . History of colonic polyps 08/11/2014  . Colon polyps   .  Encounter for screening colonoscopy 04/30/2014  . Chest pain 08/30/2013  . Diabetes mellitus ([place]) 02/09/2011  . Arteriosclerotic cardiovascular disease (ASCVD)   . Tobacco abuse, in remission   . Hypertension   . Hyperlipidemia LDL goal <100 02/23/2010  . Atrial fibrillation ([place]) 02/23/2010  . Obstructive sleep apnea 12/27/2009    LABS    Component Value Date/Time   NA 138 10/09/2014 2159   NA 137 03/12/2014 0704   NA 138 08/29/2013 2242   K 4.1 10/09/2014 2159   K 4.2 03/12/2014 0704   K 3.9 08/29/2013 2242   CL 105 10/09/2014 2159   CL 102 03/12/2014 0704   CL 98 08/29/2013 2242   CO2 25 10/09/2014 2159   CO2 24 03/12/2014 0704   CO2 25 08/29/2013 2242   GLUCOSE 123* 10/09/2014 2159   GLUCOSE 137* 03/12/2014 0704   GLUCOSE 202* 08/29/2013  2242   BUN 13 10/09/2014 2159   BUN 22 03/12/2014 0704   BUN 15 08/29/2013 2242   CREATININE 0.98 10/09/2014 2159   CREATININE 0.81 03/12/2014 0704   CREATININE 0.78 08/29/2013 2242   CREATININE 0.73 03/16/2013 0835   CREATININE 0.78 12/19/2012 2242   CALCIUM 8.8 10/09/2014 2159   CALCIUM 9.0 03/12/2014 0704   CALCIUM 9.1 08/29/2013 2242   GFRNONAA 89* 10/09/2014 2159   GFRNONAA >90 08/29/2013 2242   GFRNONAA >90 12/19/2012 2242   GFRAA >90 10/09/2014 2159   GFRAA >90 08/29/2013 2242   GFRAA >90 12/19/2012 2242   CMP     Component Value Date/Time   NA 138 10/09/2014 2159   K 4.1 10/09/2014 2159   CL 105 10/09/2014 2159   CO2 25 10/09/2014 2159   GLUCOSE 123* 10/09/2014 2159   BUN 13 10/09/2014 2159   CREATININE 0.98 10/09/2014 2159   CREATININE 0.81 03/12/2014 0704   CALCIUM 8.8 10/09/2014 2159   PROT 6.8 06/19/2015 0810   PROT 6.4 03/12/2014 0704   ALBUMIN 4.5 06/19/2015 0810   ALBUMIN 4.2 03/12/2014 0704   AST 22 06/19/2015 0810   ALT 28 06/19/2015 0810   ALKPHOS 64 06/19/2015 0810   BILITOT 0.3 06/19/2015 0810   BILITOT 0.4 03/12/2014 0704   GFRNONAA 89* 10/09/2014 2159   GFRAA >90 10/09/2014 2159       Component Value Date/Time   WBC 8.9 10/09/2014 2159   WBC 8.3 08/29/2013 2242   WBC 11.2* 12/19/2012 2242   HGB 12.1* 10/09/2014 2159   HGB 13.4 08/29/2013 2242   HGB 14.0 12/19/2012 2242   HCT 37.3* 10/09/2014 2159   HCT 39.5 08/29/2013 2242   HCT 40.5 12/19/2012 2242   MCV 79.0 10/09/2014 2159   MCV 81.1 08/29/2013 2242   MCV 80.0 12/19/2012 2242    Lipid Panel     Component Value Date/Time   CHOL 163 06/19/2015 0810   CHOL 125 03/12/2014 0704   TRIG 186* 06/19/2015 0810   HDL 27* 06/19/2015 0810   HDL 29* 03/12/2014 0704   CHOLHDL 6.0* 06/19/2015 0810   CHOLHDL 4.3 03/12/2014 0704   VLDL 38 03/12/2014 0704   LDLCALC 99 06/19/2015 0810   LDLCALC 58 03/12/2014 0704    ABG No results found for: PHART, PCO2ART, PO2ART, HCO3, TCO2,  ACIDBASEDEF, O2SAT   Lab Results  Component Value Date   TSH 0.550 02/03/2011   BNP (last 3 results)  Recent Labs  10/09/14 2159  BNP 52.0    ProBNP (last 3 results) No results for input(s): PROBNP in the last 8760 hours.  Cardiac Panel (last 3 results) No results for input(s): CKTOTAL, CKMB, TROPONINI, RELINDX in the last 72 hours.  Iron/TIBC/Ferritin/ %Sat No results found for: IRON, TIBC, FERRITIN, IRONPCTSAT   EKG Orders placed or performed during the hospital encounter of 10/09/14  . EKG 12-Lead  . EKG 12-Lead  . EKG 12-Lead  . EKG 12-Lead  . EKG     Prior Assessment and Plan Problem List as of 06/30/2015      Cardiovascular and Mediastinum   Atrial fibrillation [facility])   Last Assessment & Plan 10/14/2014 Office Visit Written 10/14/2014  2:18 PM by Lendon Colonel, NP    Currently controlled on medication regimen. Heart rate is a little here in the office. Continue watch and wait. He will have no changes in his medicines at this time to see him in 3 months or sooner if he is symptomatic.      Arteriosclerotic cardiovascular disease (ASCVD)   Last Assessment & Plan 10/14/2014 Office Visit Written 10/14/2014  2:19 PM by Lendon Colonel, NP    We will consider repeat stress test should he have recurrence of symptoms. Evaluate for progression of CAD.      Hypertension   Last Assessment & Plan 10/14/2014 Office Visit Written 10/14/2014  2:18 PM by Lendon Colonel, NP    Currently well-controlled.        Respiratory   Obstructive sleep apnea     Digestive   Colon polyps     Endocrine   Diabetes mellitus [facility])   Last Assessment & Plan 09/07/2011 Office Visit Written 09/07/2011 11:09 AM by Yehuda Savannah, MD    Patient reports excellent control with CBGs consistently less than 130 and a recent A1c level of 6.6.        Other   Hyperlipidemia LDL goal <100   Last Assessment & Plan 10/02/2012 Office Visit Written 10/02/2012  4:24 PM by Lendon Colonel, NP     Labs are followed by Dr. Wolfgang Phoenix. He is advised to increase exercise and activity to lose weight to increase HDL level and decrease LDL.       Tobacco abuse, in remission   Last Assessment & Plan 06/07/2013 Office Visit Written 06/07/2013  4:47 PM by Lendon Colonel, NP    He has restarted smoking, and is unhappy about it. He is considering electronic cigarettes. I have encouraged him to do whatever he can to quit smoking.      Chest pain   Encounter for screening colonoscopy   Last Assessment & Plan 04/29/2014 Office Visit Edited 05/07/2014  3:11 PM by Orvil Feil, NP    59 year old male due for initial screening colonoscopy without any upper or lower concerning GI symptoms. On Eliquis for afib. Will be requesting approval from cardiology to hold X 3 days.   Proceed with colonoscopy with Dr. Oneida Alar in the near future. The risks, benefits, and alternatives have been discussed in detail with the patient. They state understanding and desire to proceed.  Likely hold Eliquis X 3 days; discuss with cardiology  ADDENDUM: ELIQUIS WILL BE HOLD 48 HOURS PER CARDIOLOGY      History of colonic polyps   Genetic testing       Imaging: No results found.

## 2015-07-01 ENCOUNTER — Other Ambulatory Visit: Payer: Self-pay | Admitting: Family Medicine

## 2015-07-09 ENCOUNTER — Ambulatory Visit (HOSPITAL_COMMUNITY)
Admission: RE | Admit: 2015-07-09 | Discharge: 2015-07-09 | Disposition: A | Discharge status: Home / Self Care | Payer: 59 | Source: Ambulatory Visit | Attending: Adult Health | Admitting: Adult Health

## 2015-07-09 ENCOUNTER — Encounter (HOSPITAL_COMMUNITY): Payer: Self-pay

## 2015-07-09 ENCOUNTER — Encounter (HOSPITAL_COMMUNITY)
Admission: RE | Admit: 2015-07-09 | Discharge: 2015-07-09 | Disposition: A | Discharge status: Home / Self Care | Payer: 59 | Source: Ambulatory Visit | Attending: Adult Health | Admitting: Adult Health

## 2015-07-09 ENCOUNTER — Inpatient Hospital Stay (HOSPITAL_COMMUNITY): Admission: RE | Admit: 2015-07-09 | Payer: 59 | Source: Ambulatory Visit

## 2015-07-09 DIAGNOSIS — R072 Precordial pain: Secondary | ICD-10-CM | POA: Diagnosis not present

## 2015-07-09 DIAGNOSIS — I4891 Unspecified atrial fibrillation: Secondary | ICD-10-CM | POA: Insufficient documentation

## 2015-07-09 DIAGNOSIS — I10 Essential (primary) hypertension: Secondary | ICD-10-CM | POA: Diagnosis not present

## 2015-07-09 LAB — NM MYOCAR MULTI W/SPECT W/WALL MOTION / EF
CHL CUP NUCLEAR SDS: 8
CHL CUP RESTING HR STRESS: 60 {beats}/min
LVDIAVOL: 132 mL
LVSYSVOL: 52 mL
NUC STRESS TID: 1.08
Peak HR: 96 {beats}/min
RATE: 0.35
SRS: 2
SSS: 10

## 2015-07-09 MED ORDER — TECHNETIUM TC 99M SESTAMIBI GENERIC - CARDIOLITE
10.0000 | Freq: Once | INTRAVENOUS | Status: AC | PRN
  Administered 2015-07-09: 10 via INTRAVENOUS

## 2015-07-09 MED ORDER — REGADENOSON 0.4 MG/5ML IV SOLN
INTRAVENOUS | Status: AC
  Administered 2015-07-09: 0.4 mg via INTRAVENOUS
  Filled 2015-07-09: qty 5

## 2015-07-09 MED ORDER — TECHNETIUM TC 99M SESTAMIBI - CARDIOLITE
30.0000 | Freq: Once | INTRAVENOUS | Status: AC | PRN
  Administered 2015-07-09: 11:00:00 30 via INTRAVENOUS

## 2015-07-09 MED ORDER — SODIUM CHLORIDE 0.9 % IJ SOLN
INTRAMUSCULAR | Status: AC
  Administered 2015-07-09: 10 mL via INTRAVENOUS
  Filled 2015-07-09: qty 3

## 2015-07-13 ENCOUNTER — Encounter: Payer: Self-pay | Admitting: Gastroenterology

## 2015-07-13 ENCOUNTER — Telehealth: Payer: Self-pay | Admitting: Adult Health

## 2015-07-13 NOTE — Telephone Encounter (Signed)
Test results / tg  °

## 2015-07-14 NOTE — Telephone Encounter (Signed)
Wife informed that both tests nuc and echo are normal even though we lm on pt's private vm of the same

## 2015-07-18 ENCOUNTER — Other Ambulatory Visit: Payer: Self-pay | Admitting: Family Medicine

## 2015-07-19 ENCOUNTER — Inpatient Hospital Stay (HOSPITAL_COMMUNITY)
Admission: EM | Admit: 2015-07-19 | Discharge: 2015-07-22 | DRG: 247 | Disposition: A | Discharge status: Home / Self Care | Payer: 59 | Attending: Internal Medicine | Admitting: Internal Medicine

## 2015-07-19 ENCOUNTER — Emergency Department (HOSPITAL_COMMUNITY): Payer: 59

## 2015-07-19 ENCOUNTER — Encounter (HOSPITAL_COMMUNITY): Payer: Self-pay | Admitting: General Practice

## 2015-07-19 DIAGNOSIS — J439 Emphysema, unspecified: Secondary | ICD-10-CM | POA: Diagnosis not present

## 2015-07-19 DIAGNOSIS — R079 Chest pain, unspecified: Secondary | ICD-10-CM | POA: Diagnosis present

## 2015-07-19 DIAGNOSIS — Z7901 Long term (current) use of anticoagulants: Secondary | ICD-10-CM

## 2015-07-19 DIAGNOSIS — E785 Hyperlipidemia, unspecified: Secondary | ICD-10-CM | POA: Diagnosis present

## 2015-07-19 DIAGNOSIS — I200 Unstable angina: Secondary | ICD-10-CM | POA: Diagnosis not present

## 2015-07-19 DIAGNOSIS — E118 Type 2 diabetes mellitus with unspecified complications: Secondary | ICD-10-CM | POA: Diagnosis not present

## 2015-07-19 DIAGNOSIS — E1165 Type 2 diabetes mellitus with hyperglycemia: Secondary | ICD-10-CM

## 2015-07-19 DIAGNOSIS — E1122 Type 2 diabetes mellitus with diabetic chronic kidney disease: Secondary | ICD-10-CM | POA: Diagnosis present

## 2015-07-19 DIAGNOSIS — I10 Essential (primary) hypertension: Secondary | ICD-10-CM | POA: Diagnosis present

## 2015-07-19 DIAGNOSIS — J449 Chronic obstructive pulmonary disease, unspecified: Secondary | ICD-10-CM | POA: Diagnosis present

## 2015-07-19 DIAGNOSIS — G4733 Obstructive sleep apnea (adult) (pediatric): Secondary | ICD-10-CM | POA: Diagnosis present

## 2015-07-19 DIAGNOSIS — I25118 Atherosclerotic heart disease of native coronary artery with other forms of angina pectoris: Secondary | ICD-10-CM | POA: Insufficient documentation

## 2015-07-19 DIAGNOSIS — I480 Paroxysmal atrial fibrillation: Secondary | ICD-10-CM | POA: Diagnosis present

## 2015-07-19 DIAGNOSIS — K219 Gastro-esophageal reflux disease without esophagitis: Secondary | ICD-10-CM | POA: Diagnosis present

## 2015-07-19 DIAGNOSIS — I482 Chronic atrial fibrillation, unspecified: Secondary | ICD-10-CM | POA: Diagnosis present

## 2015-07-19 DIAGNOSIS — I340 Nonrheumatic mitral (valve) insufficiency: Secondary | ICD-10-CM | POA: Diagnosis present

## 2015-07-19 DIAGNOSIS — E119 Type 2 diabetes mellitus without complications: Secondary | ICD-10-CM | POA: Diagnosis present

## 2015-07-19 DIAGNOSIS — Z87891 Personal history of nicotine dependence: Secondary | ICD-10-CM

## 2015-07-19 DIAGNOSIS — G2581 Restless legs syndrome: Secondary | ICD-10-CM | POA: Diagnosis present

## 2015-07-19 DIAGNOSIS — I25110 Atherosclerotic heart disease of native coronary artery with unstable angina pectoris: Secondary | ICD-10-CM | POA: Diagnosis present

## 2015-07-19 LAB — BASIC METABOLIC PANEL
Anion gap: 10 (ref 5–15)
BUN: 12 mg/dL (ref 6–20)
CHLORIDE: 105 mmol/L (ref 101–111)
CO2: 24 mmol/L (ref 22–32)
CREATININE: 0.88 mg/dL (ref 0.61–1.24)
Calcium: 8.6 mg/dL — ABNORMAL LOW (ref 8.9–10.3)
GFR calc Af Amer: >60 mL/min (ref >60)
GFR calc non Af Amer: >60 mL/min (ref >60)
GLUCOSE: 118 mg/dL — AB (ref 65–99)
POTASSIUM: 4 mmol/L (ref 3.5–5.1)
Sodium: 139 mmol/L (ref 135–145)

## 2015-07-19 LAB — CBC WITH DIFFERENTIAL/PLATELET
Basophils Absolute: 0 10*3/uL (ref 0.0–0.1)
Basophils Relative: 0 %
EOS PCT: 1 %
Eosinophils Absolute: 0.1 10*3/uL (ref 0.0–0.7)
HCT: 36.8 % — ABNORMAL LOW (ref 39.0–52.0)
HEMOGLOBIN: 11.9 g/dL — AB (ref 13.0–17.0)
LYMPHS ABS: 1.8 10*3/uL (ref 0.7–4.0)
Lymphocytes Relative: 27 %
MCH: 25.4 pg — AB (ref 26.0–34.0)
MCHC: 32.3 g/dL (ref 30.0–36.0)
MCV: 78.5 fL (ref 78.0–100.0)
MONOS PCT: 6 %
Monocytes Absolute: 0.4 10*3/uL (ref 0.1–1.0)
NEUTROS PCT: 66 %
Neutro Abs: 4.4 10*3/uL (ref 1.7–7.7)
Platelets: 108 10*3/uL — ABNORMAL LOW (ref 150–400)
RBC: 4.69 MIL/uL (ref 4.22–5.81)
RDW: 14.9 % (ref 11.5–15.5)
WBC: 6.7 10*3/uL (ref 4.0–10.5)

## 2015-07-19 LAB — I-STAT TROPONIN, ED
Troponin i, poc: 0.02 ng/mL (ref 0.00–0.08)
Troponin i, poc: 0.03 ng/mL (ref 0.00–0.08)

## 2015-07-19 LAB — GLUCOSE, CAPILLARY: GLUCOSE-CAPILLARY: 102 mg/dL — AB (ref 65–99)

## 2015-07-19 MED ORDER — NITROGLYCERIN 0.4 MG SL SUBL
0.4000 mg | SUBLINGUAL_TABLET | SUBLINGUAL | Status: DC | PRN
  Administered 2015-07-19 (×2): 0.4 mg via SUBLINGUAL
  Filled 2015-07-19: qty 1

## 2015-07-19 MED ORDER — ASPIRIN 325 MG PO TABS
325.0000 mg | ORAL_TABLET | Freq: Every day | ORAL | Status: DC
  Administered 2015-07-19 – 2015-07-21 (×3): 325 mg via ORAL
  Filled 2015-07-19 (×3): qty 1

## 2015-07-19 NOTE — ED Notes (Signed)
Pt presents with left sided chest pain that started today at 1230. Pt reports pain as pressure and is currently rating pain a 3/10. Pt took 3 nitroglycerin tablets at 1610. Pt is A/O. Pt is reporting some left neck/jaw and left shoulder pain. Pt is A/O. Pt also reporting some nausea. Pt recently underwent a stress test, and an echo that did not show any signs of heart trouble per patient. Pt has a history of a-fib and takes eliqius.

## 2015-07-19 NOTE — Consult Note (Signed)
  Primary cardiologist: Dr Branch  HPI: 58-year-old male with past medical history of paroxysmal atrial fibrillation, hypertension, diabetes mellitus, hyperlipidemia, sleep apnea for evaluation of chest pain. Patient had nonobstructive disease by cardiac catheterization in 2000. Echocardiogram in November 2016 showed normal LV function, moderate left ventricular hypertrophy, mild mitral regurgitation and mild left atrial enlargement. Nuclear study November 2016 showed ejection fraction 60%. There was a small moderate intensity inferior defect that was reversible suggestive of inferior ischemia. Patient states that for the past 2 months he has had intermittent chest pain. It is in the left chest area radiating to the neck and mouth. It is described as a sharp pain with associated pressure. There is nausea and dyspnea but no diaphoresis. The pain typically occurs with exertion and is relieved with rest. The pain awakened him from sleep today. He has very minimal pain at present. Cardiology is now asked to evaluate.   (Not in a hospital admission)  No Known Allergies  Past Medical History  Diagnosis Date  . Paroxysmal atrial fibrillation (HCC) 10/2004    Onset in 10/2004; recurred 09/2008  . Arteriosclerotic cardiovascular disease (ASCVD)     Nonobstructive; cath in 2000: 30-40% mid LAD and proximal RCA;normal EF. stress nuclear in 2006-subtle inferoseptal hypoperfusion with reversibility; negative stress EKG; good exercise tolerance  . Hypertension   . Sinusitis   . Insomnia   . Obstructive sleep apnea 12/2009    01/26/2010 AHI 83/hr  . Diabetes mellitus without complication (HCC)   . PUD (peptic ulcer disease)     1980s  . Hyperlipidemia   . RLS (restless legs syndrome)   . Asthma   . CHF (congestive heart failure) (HCC)   . Colon polyps     30 colon polyps found on first colonoscopy    Past Surgical History  Procedure Laterality Date  . Hernia repair  1986    Left inguinal  .  Rotator cuff repair      Right  . Colonoscopy N/A 05/19/2014    Dr. Fields:mild diverticulosis/moderate external hemorrhoids, >20 simple adenomas. Genetic screening negative.     Social History   Social History  . Marital Status: Married    Spouse Name: N/A  . Number of Children: 5  . Years of Education: N/A   Occupational History  . employed     full-time   Social History Main Topics  . Smoking status: Former Smoker -- 0.25 packs/day for 32 years    Types: Cigarettes    Start date: 07/24/1970    Quit date: 07/04/2013  . Smokeless tobacco: Never Used  . Alcohol Use: No  . Drug Use: No  . Sexual Activity:    Partners: Female   Other Topics Concern  . Not on file   Social History Narrative    Family History  Problem Relation Age of Onset  . Hypertension Mother   . Breast cancer Mother 68  . Heart attack Father   . Diabetes Brother   . Colon cancer Neg Hx   . Skin cancer Sister 45  . Brain cancer Maternal Uncle   . Cancer Maternal Uncle     NOS  . Breast cancer Cousin     maternal cousin dx <50    ROS:  no fevers or chills, productive cough, hemoptysis, dysphasia, odynophagia, melena, hematochezia, dysuria, hematuria, rash, seizure activity, orthopnea, PND, pedal edema, claudication. Remaining systems are negative.  Physical Exam:   Blood pressure 149/101, pulse 81, resp. rate 12, SpO2 96 %.    General:  Well developed/well nourished in NAD Skin warm/dry Patient not depressed No peripheral clubbing Back-normal HEENT-normal/normal eyelids Neck supple/normal carotid upstroke bilaterally; no bruits; no JVD; no thyromegaly chest - CTA/ normal expansion CV - RRR/normal S1 and S2; no murmurs, rubs or gallops;  PMI nondisplaced Abdomen -NT/ND, no HSM, no mass, + bowel sounds, no bruit 2+ femoral pulses, no bruits Ext-no edema, chords, 2+ DP Neuro-grossly nonfocal  ECG Atrial fibrillation, RV conduction delay, septal infarct, nonspecific ST changes.  Results  for orders placed or performed during the hospital encounter of 07/19/15 (from the past 48 hour(s))  CBC with Differential     Status: Abnormal (Preliminary result)   Collection Time: 07/19/15  5:31 PM  Result Value Ref Range   WBC 6.7 4.0 - 10.5 K/uL   RBC 4.69 4.22 - 5.81 MIL/uL   Hemoglobin 11.9 (L) 13.0 - 17.0 g/dL   HCT 36.8 (L) 39.0 - 52.0 %   MCV 78.5 78.0 - 100.0 fL   MCH 25.4 (L) 26.0 - 34.0 pg   MCHC 32.3 30.0 - 36.0 g/dL   RDW 14.9 11.5 - 15.5 %   Platelets PENDING 150 - 400 K/uL   Neutrophils Relative % 66 %   Neutro Abs 4.4 1.7 - 7.7 K/uL   Lymphocytes Relative 27 %   Lymphs Abs 1.8 0.7 - 4.0 K/uL   Monocytes Relative 6 %   Monocytes Absolute 0.4 0.1 - 1.0 K/uL   Eosinophils Relative 1 %   Eosinophils Absolute 0.1 0.0 - 0.7 K/uL   Basophils Relative 0 %   Basophils Absolute 0.0 0.0 - 0.1 K/uL  Basic metabolic panel     Status: Abnormal   Collection Time: 07/19/15  5:31 PM  Result Value Ref Range   Sodium 139 135 - 145 mmol/L   Potassium 4.0 3.5 - 5.1 mmol/L   Chloride 105 101 - 111 mmol/L   CO2 24 22 - 32 mmol/L   Glucose, Bld 118 (H) 65 - 99 mg/dL   BUN 12 6 - 20 mg/dL   Creatinine, Ser 0.88 0.61 - 1.24 mg/dL   Calcium 8.6 (L) 8.9 - 10.3 mg/dL   GFR calc non Af Amer >60 >60 mL/min   GFR calc Af Amer >60 >60 mL/min    Comment: (NOTE) The eGFR has been calculated using the CKD EPI equation. This calculation has not been validated in all clinical situations. eGFR's persistently <60 mL/min signify possible Chronic Kidney Disease.    Anion gap 10 5 - 15  I-Stat Troponin, ED - 0, 3, 6 hours (not at [facility])     Status: None   Collection Time: 07/19/15  5:37 PM  Result Value Ref Range   Troponin i, poc 0.02 0.00 - 0.08 ng/mL   Comment 3            Comment: Due to the release kinetics of cTnI, a negative result within the first hours of the onset of symptoms does not rule out myocardial infarction with certainty. If myocardial infarction is still  suspected, repeat the test at appropriate intervals.     Dg Chest 2 View  07/19/2015  CLINICAL DATA:  Left-sided chest pain with onset earlier today. Initial encounter. EXAM: CHEST  2 VIEW COMPARISON:  10/09/2014 and 08/30/2013 radiographs. FINDINGS: The heart size and mediastinal contours are stable. There is stable mild asymmetric elevation of the right hemidiaphragm. The lungs are clear. There is no pleural effusion or pneumothorax. No acute osseous findings are demonstrated. There is   evidence of previous distal right clavicle resection. IMPRESSION: Stable chest.  No active cardiopulmonary process. Electronically Signed   By: William  Veazey M.D.   On: 07/19/2015 17:45    Assessment/Plan 1 unstable angina-the patient presents with chest pain that is concerning. He also had a recent nuclear study that was abnormal. We will plan to hold his apixaban. Begin IV heparin tomorrow morning. Proceed with cardiac catheterization most likely Tuesday morning after apixaban wears off. The risks and benefits were discussed and he agrees to proceed. Add enteric-coated aspirin 81 mg daily. Continue Lipitor. 2 permanent atrial fibrillation-the patient is in atrial fibrillation and his rate is controlled. Continue Cardizem and digoxin for rate control. Hold apixaban as outlined above. We will resume once all procedures are complete. 3 hypertension-Continue preadmission medications. 4 hyperlipidemia-continue statin. 5 diabetes mellitus-management per primary care. Hold Glucophage now and for 48 hours following catheterization.    MD 07/19/2015, 6:35 PM   

## 2015-07-19 NOTE — ED Provider Notes (Signed)
CSN: [id number]     Arrival date & time 07/19/15  1644 History   First MD Initiated Contact with Patient 07/19/15 1707     Chief Complaint  Patient presents with  . Chest Pain     (Consider location/radiation/quality/duration/timing/severity/associated sxs/prior Treatment) Patient is a 59 y.o. male presenting with chest pain.  Chest Pain Pain location:  L chest Pain quality: pressure   Pain radiates to:  L jaw and L shoulder Pain radiates to the back: no   Pain severity:  Severe Onset quality:  Sudden Duration:  5 hours Timing:  Constant Progression:  Partially resolved Chronicity:  Recurrent Context: movement and at rest   Relieved by:  Nitroglycerin Worsened by:  Exertion Ineffective treatments:  Leaning forward and rest Associated symptoms: diaphoresis, nausea, shortness of breath and vomiting   Associated symptoms: no abdominal pain, no back pain, no dizziness, no fever, no headache, no near-syncope, no numbness, no palpitations, no syncope and no weakness   Risk factors: diabetes mellitus, high cholesterol, hypertension and male sex   Risk factors: no coronary artery disease and no prior DVT/PE   Risk factors comment:  Recent stress test and echo that were unremarkable   Past Medical History  Diagnosis Date  . Paroxysmal atrial fibrillation ([place]) 10/2004    Onset in 10/2004; recurred 09/2008  . Arteriosclerotic cardiovascular disease (ASCVD)     Nonobstructive; cath in 2000: 30-40% mid LAD and proximal RCA;normal EF. stress nuclear in 2006-subtle inferoseptal hypoperfusion with reversibility; negative stress EKG; good exercise tolerance  . Hypertension   . Sinusitis   . Insomnia   . Obstructive sleep apnea 12/2009    01/26/2010 AHI 83/hr  . Diabetes mellitus without complication ([place])   . PUD (peptic ulcer disease)     1980s  . Hyperlipidemia   . RLS (restless legs syndrome)   . Asthma   . CHF (congestive heart failure) ([place])   . Colon polyps     30 colon polyps  found on first colonoscopy   Past Surgical History  Procedure Laterality Date  . Hernia repair  1986    Left inguinal  . Rotator cuff repair      Right  . Colonoscopy N/A 05/19/2014    Dr. Barnie Alderman diverticulosis/moderate external hemorrhoids, >20 simple adenomas. Genetic screening negative.    Family History  Problem Relation Age of Onset  . Hypertension Mother   . Breast cancer Mother 43  . Heart attack Father   . Diabetes Brother   . Colon cancer Neg Hx   . Skin cancer Sister 72  . Brain cancer Maternal Uncle   . Cancer Maternal Uncle     NOS  . Breast cancer Cousin     maternal cousin dx <50   Social History  Substance Use Topics  . Smoking status: Former Smoker -- 1.00 packs/day for 32 years    Types: Cigarettes    Start date: 07/24/1970    Quit date: 07/04/2013  . Smokeless tobacco: Never Used  . Alcohol Use: No    Review of Systems  Constitutional: Positive for diaphoresis. Negative for fever and appetite change.  HENT: Negative for congestion.   Eyes: Negative for visual disturbance.  Respiratory: Positive for chest tightness and shortness of breath.   Cardiovascular: Negative for chest pain, palpitations, syncope and near-syncope.  Gastrointestinal: Positive for nausea and vomiting. Negative for abdominal pain and blood in stool.  Genitourinary: Negative for dysuria, hematuria, flank pain and decreased urine volume.  Musculoskeletal: Negative for back  pain and neck pain.  Skin: Negative for rash.  Neurological: Negative for dizziness, seizures, syncope, weakness, light-headedness, numbness and headaches.  Psychiatric/Behavioral: Negative for behavioral problems.      Allergies  Review of patient's allergies indicates no known allergies.  Home Medications   Prior to Admission medications   Medication Sig Start Date End Date Taking? Authorizing Provider  albuterol-ipratropium (COMBIVENT) 18-103 MCG/ACT inhaler Inhale 2 puffs into the lungs every 6  (six) hours as needed. Patient taking differently: Inhale 2 puffs into the lungs every 6 (six) hours as needed for wheezing or shortness of breath.  04/01/13  Yes Kathyrn Drown, MD  atorvastatin (LIPITOR) 40 MG tablet Take 1 tablet (40 mg total) by mouth daily. 05/07/14  Yes Arnoldo Lenis, MD  chlorthalidone (HYGROTON) 25 MG tablet TAKE 1 TABLET BY MOUTH AS NEEDED FOR FLUID RETENTION 05/22/15  Yes Arnoldo Lenis, MD  [place] 250 MCG tablet TAKE 1 TABLET BY MOUTH EVERY MORNING 01/06/15  Yes Arnoldo Lenis, MD  ELIQUIS 5 MG TABS tablet TAKE 1 TABLET BY MOUTH TWICE DAILY 05/06/15  Yes Arnoldo Lenis, MD  HYDROcodone-acetaminophen (NORCO/VICODIN) 5-325 MG tablet Take 0.5-1 tablets by mouth daily as needed. Patient taking differently: Take 0.5-1 tablets by mouth daily as needed for moderate pain.  06/18/15  Yes Mikey Kirschner, MD  lisinopril (PRINIVIL,ZESTRIL) 5 MG tablet TAKE 1 TABLET BY MOUTH DAILY 04/07/15  Yes Arnoldo Lenis, MD  metFORMIN (GLUCOPHAGE) 500 MG tablet TAKE 2 TABLETS BY MOUTH TWICE DAILY WITH A MEAL 01/05/15  Yes Mikey Kirschner, MD  Multiple Vitamins-Minerals (MULTIVITAMIN WITH MINERALS) tablet Take 1 tablet by mouth daily.     Yes Historical Provider, MD  nitroGLYCERIN (NITROSTAT) 0.4 MG SL tablet Place 1 tablet (0.4 mg total) under the tongue every 5 (five) minutes as needed. Patient taking differently: Place 0.4 mg under the tongue every 5 (five) minutes as needed for chest pain.  06/30/15  Yes Lendon Colonel, NP  Omega-3 Fatty Acids (FISH OIL PO) Take 1 tablet by mouth daily.   Yes Historical Provider, MD  omeprazole (PRILOSEC) 20 MG capsule TAKE 1 CAPSULE(20 MG) BY MOUTH DAILY AS NEEDED FOR ACID REFLUX 06/18/15  Yes Mikey Kirschner, MD  rOPINIRole (REQUIP) 2 MG tablet TAKE 1 TABLET BY MOUTH EVERY NIGHT AT BEDTIME 12/26/14  Yes Mikey Kirschner, MD  sodium chloride (OCEAN) 0.65 % SOLN nasal spray Place 1 spray into both nostrils as needed for congestion.   Yes Historical  Provider, MD  traZODone (DESYREL) 50 MG tablet Take 2 tablets (100 mg total) by mouth at bedtime. 12/26/14  Yes Mikey Kirschner, MD  VIAGRA 100 MG tablet TAKE 1 TABLET BY MOUTH EVERY DAY AS NEEDED FOR ERECTILE DYSFUNCTION 04/23/15  Yes Arnoldo Lenis, MD  rOPINIRole (REQUIP) 2 MG tablet TAKE 1 TABLET BY MOUTH EVERY NIGHT AT BEDTIME Patient not taking: Reported on 07/19/2015 07/01/15   Mikey Kirschner, MD  TAZTIA XT 360 MG 24 hr capsule TAKE 1 CAPSULE BY MOUTH EVERY DAY Patient not taking: Reported on 07/19/2015 12/22/14   Lendon Colonel, NP  triamcinolone cream (KENALOG) 0.1 % Apply 1 application topically 2 (two) times daily. Patient not taking: Reported on 07/19/2015 06/30/14   Mikey Kirschner, MD   BP 159/103 mmHg  Pulse 87  Temp(Src) 98.7 F (37.1 C) (Oral)  Resp 20  Ht 5\' 8"  (1.727 m)  Wt 220 lb 3.2 oz (99.882 kg)  BMI 33.49 kg/m2  SpO2 97% Physical Exam  Constitutional: He is oriented to person, place, and time. He appears well-developed and well-nourished.  HENT:  Head: Atraumatic.  Mouth/Throat: Oropharynx is clear and moist.  Eyes: Conjunctivae and EOM are normal. Pupils are equal, round, and reactive to light.  Neck: Normal range of motion. Neck supple. No JVD present. No tracheal deviation present.  Cardiovascular: Regular rhythm, normal heart sounds and intact distal pulses.   Pulmonary/Chest: Effort normal and breath sounds normal. No respiratory distress. He has no wheezes. He exhibits no tenderness.  Abdominal: Soft. There is no tenderness.  Musculoskeletal: Normal range of motion.  Neurological: He is alert and oriented to person, place, and time.  Skin: Skin is warm.  Psychiatric: He has a normal mood and affect.    ED Course  Procedures (including critical care time) Labs Review Labs Reviewed  CBC WITH DIFFERENTIAL/PLATELET - Abnormal; Notable for the following:    Hemoglobin 11.9 (*)    HCT 36.8 (*)    MCH 25.4 (*)    Platelets 108 (*)    All other  components within normal limits  BASIC METABOLIC PANEL - Abnormal; Notable for the following:    Glucose, Bld 118 (*)    Calcium 8.6 (*)    All other components within normal limits  GLUCOSE, CAPILLARY - Abnormal; Notable for the following:    Glucose-Capillary 102 (*)    All other components within normal limits  I-STAT TROPOININ, ED  Randolm Idol, ED    Imaging Review Dg Chest 2 View  07/19/2015  CLINICAL DATA:  Left-sided chest pain with onset earlier today. Initial encounter. EXAM: CHEST  2 VIEW COMPARISON:  10/09/2014 and 08/30/2013 radiographs. FINDINGS: The heart size and mediastinal contours are stable. There is stable mild asymmetric elevation of the right hemidiaphragm. The lungs are clear. There is no pleural effusion or pneumothorax. No acute osseous findings are demonstrated. There is evidence of previous distal right clavicle resection. IMPRESSION: Stable chest.  No active cardiopulmonary process. Electronically Signed   By: Richardean Sale M.D.   On: 07/19/2015 17:45   I have personally reviewed and evaluated these images and lab results as part of my medical decision-making.   EKG Interpretation   Date/Time:  Sunday July 19 2015 16:53:06 EST Ventricular Rate:  92 PR Interval:    QRS Duration: 98 QT Interval:  338 QTC Calculation: 418 R Axis:   37 Text Interpretation:  Atrial fibrillation Probable anteroseptal infarct,  old Borderline ST depression, diffuse leads Confirmed by ZAVITZ  MD,  JOSHUA ([id number]) on 07/19/2015 5:32:41 PM      MDM   Final diagnoses:  None    Patient is a 59 year old male with past medical history significant for HTN, DM, HLD, who presents to the emergency department with chest pain. Typical chest pain starting 4 hours ago. Resolved with nitroglycerin. On arrival no acute distress, not ill-appearing, chest pain-free. Afebrile, hemodynamically stable, SpO2 > 92% on room air.  Differential diagnosis includes ACS, pericarditis,  pulmonary embolus, pneumonia, pneumothorax, aortic dissection, cardiac tamponade, PUD. Basic labs including troponin obtained. A stress test and echo obtained a couple of months ago, showed no acute findings. Has not received a cardiac cath in multiple years. CXR showed no acute findings. EKG with no ST depression in the inferior and lateral leads, no ST elevation. Normal intervals. No chamber enlargement.   initial troponin 0.02.  Low risk Well's criteria, doubt pulmonary embolism. No back pain, intact distal pulses, doubt aortic dissection. Do not feel  that CT chest is necessary at this time.  Discussed the patient's case with cardiology who recommended admission to medicine. Patient admitted to medicine for ACS rule out. Patient is stable for the floor. Transferred to the floor in stable condition.     Nathaniel Man, MD 07/19/15 2344  Elnora Morrison, MD 07/23/15 [phone]

## 2015-07-19 NOTE — ED Notes (Addendum)
Attempted report, bed has not been assigned to a nurse yet

## 2015-07-20 DIAGNOSIS — J449 Chronic obstructive pulmonary disease, unspecified: Secondary | ICD-10-CM | POA: Diagnosis present

## 2015-07-20 DIAGNOSIS — E118 Type 2 diabetes mellitus with unspecified complications: Secondary | ICD-10-CM

## 2015-07-20 LAB — GLUCOSE, CAPILLARY
GLUCOSE-CAPILLARY: 108 mg/dL — AB (ref 65–99)
GLUCOSE-CAPILLARY: 112 mg/dL — AB (ref 65–99)
Glucose-Capillary: 132 mg/dL — ABNORMAL HIGH (ref 65–99)
Glucose-Capillary: 137 mg/dL — ABNORMAL HIGH (ref 65–99)

## 2015-07-20 LAB — CBC WITH DIFFERENTIAL/PLATELET
Basophils Absolute: 0 10*3/uL (ref 0.0–0.1)
Basophils Relative: 0 %
EOS PCT: 1 %
Eosinophils Absolute: 0.1 10*3/uL (ref 0.0–0.7)
HCT: 35.4 % — ABNORMAL LOW (ref 39.0–52.0)
Hemoglobin: 11.4 g/dL — ABNORMAL LOW (ref 13.0–17.0)
LYMPHS ABS: 2.5 10*3/uL (ref 0.7–4.0)
LYMPHS PCT: 29 %
MCH: 25.3 pg — AB (ref 26.0–34.0)
MCHC: 32.2 g/dL (ref 30.0–36.0)
MCV: 78.5 fL (ref 78.0–100.0)
MONO ABS: 0.6 10*3/uL (ref 0.1–1.0)
Monocytes Relative: 7 %
Neutro Abs: 5.4 10*3/uL (ref 1.7–7.7)
Neutrophils Relative %: 63 %
PLATELETS: 187 10*3/uL (ref 150–400)
RBC: 4.51 MIL/uL (ref 4.22–5.81)
RDW: 14.8 % (ref 11.5–15.5)
WBC: 8.6 10*3/uL (ref 4.0–10.5)

## 2015-07-20 LAB — PROTIME-INR
INR: 1.13 (ref 0.00–1.49)
Prothrombin Time: 14.7 seconds (ref 11.6–15.2)

## 2015-07-20 LAB — TROPONIN I
TROPONIN I: 0.03 ng/mL (ref <0.031)
Troponin I: 0.03 ng/mL (ref <0.031)

## 2015-07-20 LAB — APTT
APTT: 45 s — AB (ref 24–37)
APTT: 63 s — AB (ref 24–37)
aPTT: 28 seconds (ref 24–37)

## 2015-07-20 LAB — HEPARIN LEVEL (UNFRACTIONATED): Heparin Unfractionated: 0.63 IU/mL (ref 0.30–0.70)

## 2015-07-20 LAB — MAGNESIUM: Magnesium: 1.8 mg/dL (ref 1.7–2.4)

## 2015-07-20 LAB — DIGOXIN LEVEL: DIGOXIN LVL: 0.8 ng/mL (ref 0.8–2.0)

## 2015-07-20 LAB — PHOSPHORUS: Phosphorus: 3.4 mg/dL (ref 2.5–4.6)

## 2015-07-20 MED ORDER — CHLORTHALIDONE 25 MG PO TABS
25.0000 mg | ORAL_TABLET | Freq: Every day | ORAL | Status: DC
  Administered 2015-07-20 – 2015-07-21 (×2): 25 mg via ORAL
  Filled 2015-07-20 (×4): qty 1

## 2015-07-20 MED ORDER — MULTI-VITAMIN/MINERALS PO TABS: 1.0000 | ORAL_TABLET | Freq: Every day | ORAL | Status: DC

## 2015-07-20 MED ORDER — SODIUM CHLORIDE 0.9 % IJ SOLN: 3.0000 mL | INTRAMUSCULAR | Status: DC | PRN

## 2015-07-20 MED ORDER — METFORMIN HCL 500 MG PO TABS: 500.0000 mg | ORAL_TABLET | Freq: Two times a day (BID) | ORAL | Status: DC

## 2015-07-20 MED ORDER — IPRATROPIUM-ALBUTEROL 18-103 MCG/ACT IN AERO: 2.0000 | INHALATION_SPRAY | Freq: Four times a day (QID) | RESPIRATORY_TRACT | Status: DC | PRN

## 2015-07-20 MED ORDER — IPRATROPIUM-ALBUTEROL 0.5-2.5 (3) MG/3ML IN SOLN: 3.0000 mL | Freq: Four times a day (QID) | RESPIRATORY_TRACT | Status: DC | PRN

## 2015-07-20 MED ORDER — SODIUM CHLORIDE 0.9 % IJ SOLN
3.0000 mL | Freq: Two times a day (BID) | INTRAMUSCULAR | Status: DC
  Administered 2015-07-20 (×2): 3 mL via INTRAVENOUS

## 2015-07-20 MED ORDER — CETYLPYRIDINIUM CHLORIDE 0.05 % MT LIQD
7.0000 mL | Freq: Two times a day (BID) | OROMUCOSAL | Status: DC
  Administered 2015-07-20 – 2015-07-21 (×3): 7 mL via OROMUCOSAL

## 2015-07-20 MED ORDER — ROPINIROLE HCL 1 MG PO TABS
2.0000 mg | ORAL_TABLET | Freq: Every day | ORAL | Status: DC
  Administered 2015-07-20 – 2015-07-21 (×3): 2 mg via ORAL
  Filled 2015-07-20 (×4): qty 2

## 2015-07-20 MED ORDER — HEPARIN (PORCINE) IN NACL 100-0.45 UNIT/ML-% IJ SOLN
1600.0000 [IU]/h | INTRAMUSCULAR | Status: DC
  Administered 2015-07-20: 1300 [IU]/h via INTRAVENOUS
  Administered 2015-07-20: 1600 [IU]/h via INTRAVENOUS
  Filled 2015-07-20 (×2): qty 250

## 2015-07-20 MED ORDER — SODIUM CHLORIDE 0.9 % IV SOLN: 250.0000 mL | INTRAVENOUS | Status: DC | PRN

## 2015-07-20 MED ORDER — ATORVASTATIN CALCIUM 40 MG PO TABS
40.0000 mg | ORAL_TABLET | Freq: Every day | ORAL | Status: DC
  Administered 2015-07-20 – 2015-07-21 (×2): 40 mg via ORAL
  Filled 2015-07-20 (×2): qty 1

## 2015-07-20 MED ORDER — ONDANSETRON HCL 4 MG/2ML IJ SOLN: 4.0000 mg | Freq: Four times a day (QID) | INTRAMUSCULAR | Status: DC | PRN

## 2015-07-20 MED ORDER — SODIUM CHLORIDE 0.9 % WEIGHT BASED INFUSION
3.0000 mL/kg/h | INTRAVENOUS | Status: DC
  Administered 2015-07-21: 3 mL/kg/h via INTRAVENOUS

## 2015-07-20 MED ORDER — INSULIN ASPART 100 UNIT/ML ~~LOC~~ SOLN: 0.0000 [IU] | Freq: Every day | SUBCUTANEOUS | Status: DC

## 2015-07-20 MED ORDER — DIGOXIN 250 MCG PO TABS
250.0000 ug | ORAL_TABLET | Freq: Every morning | ORAL | Status: DC
  Administered 2015-07-20 – 2015-07-22 (×3): 250 ug via ORAL
  Filled 2015-07-20 (×3): qty 1

## 2015-07-20 MED ORDER — DILTIAZEM HCL 30 MG PO TABS
30.0000 mg | ORAL_TABLET | Freq: Four times a day (QID) | ORAL | Status: DC
  Administered 2015-07-20 – 2015-07-21 (×3): 30 mg via ORAL
  Filled 2015-07-20 (×3): qty 1

## 2015-07-20 MED ORDER — LISINOPRIL 5 MG PO TABS
5.0000 mg | ORAL_TABLET | Freq: Every day | ORAL | Status: DC
  Administered 2015-07-20 – 2015-07-22 (×3): 5 mg via ORAL
  Filled 2015-07-20 (×3): qty 1

## 2015-07-20 MED ORDER — ACETAMINOPHEN 325 MG PO TABS: 650.0000 mg | ORAL_TABLET | ORAL | Status: DC | PRN

## 2015-07-20 MED ORDER — SODIUM CHLORIDE 0.9 % IJ SOLN
3.0000 mL | Freq: Two times a day (BID) | INTRAMUSCULAR | Status: DC
  Administered 2015-07-20: 3 mL via INTRAVENOUS

## 2015-07-20 MED ORDER — ADULT MULTIVITAMIN W/MINERALS CH: 1.0000 | ORAL_TABLET | Freq: Every day | ORAL | Status: DC

## 2015-07-20 MED ORDER — TRAZODONE HCL 50 MG PO TABS
100.0000 mg | ORAL_TABLET | Freq: Every day | ORAL | Status: DC
  Administered 2015-07-20 – 2015-07-21 (×3): 100 mg via ORAL
  Filled 2015-07-20 (×2): qty 1
  Filled 2015-07-20: qty 2

## 2015-07-20 MED ORDER — PANTOPRAZOLE SODIUM 40 MG PO TBEC
40.0000 mg | DELAYED_RELEASE_TABLET | Freq: Every day | ORAL | Status: DC
  Administered 2015-07-20 – 2015-07-22 (×3): 40 mg via ORAL
  Filled 2015-07-20 (×3): qty 1

## 2015-07-20 MED ORDER — SODIUM CHLORIDE 0.9 % IJ SOLN: 3.0000 mL | Freq: Two times a day (BID) | INTRAMUSCULAR | Status: DC

## 2015-07-20 MED ORDER — ASPIRIN 81 MG PO CHEW
81.0000 mg | CHEWABLE_TABLET | ORAL | Status: AC
  Administered 2015-07-21: 81 mg via ORAL
  Filled 2015-07-20: qty 1

## 2015-07-20 MED ORDER — INSULIN ASPART 100 UNIT/ML ~~LOC~~ SOLN
0.0000 [IU] | Freq: Three times a day (TID) | SUBCUTANEOUS | Status: DC
  Administered 2015-07-21: 1 [IU] via SUBCUTANEOUS

## 2015-07-20 MED ORDER — SODIUM CHLORIDE 0.9 % WEIGHT BASED INFUSION
1.0000 mL/kg/h | INTRAVENOUS | Status: DC
  Administered 2015-07-21: 1 mL/kg/h via INTRAVENOUS

## 2015-07-20 NOTE — Progress Notes (Signed)
UR Completed Idali Lafever Graves-Bigelow, RN,BSN [phone]  

## 2015-07-20 NOTE — Progress Notes (Signed)
ANTICOAGULATION CONSULT NOTE - Initial Consult  Pharmacy Consult for heparin Indication: Afib and USAP  No Known Allergies  Patient Measurements: Height: 5\' 8"  (172.7 cm) Weight: 220 lb 3.2 oz (99.882 kg) IBW/kg (Calculated) : 68.4 Heparin Dosing Weight: 90kg  Vital Signs: Temp: 97.8 F (36.6 C) (11/21 0020) Temp Source: Oral (11/21 0020) BP: 141/90 mmHg (11/21 0020) Pulse Rate: 89 (11/21 0020)  Labs:  Recent Labs  07/19/15 1731  HGB 11.9*  HCT 36.8*  PLT 108*  CREATININE 0.88    Estimated Creatinine Clearance: 104.8 mL/min (by C-G formula based on Cr of 0.88).   Medical History: Past Medical History  Diagnosis Date  . Paroxysmal atrial fibrillation ([place]) 10/2004    Onset in 10/2004; recurred 09/2008  . Arteriosclerotic cardiovascular disease (ASCVD)     Nonobstructive; cath in 2000: 30-40% mid LAD and proximal RCA;normal EF. stress nuclear in 2006-subtle inferoseptal hypoperfusion with reversibility; negative stress EKG; good exercise tolerance  . Hypertension   . Sinusitis   . Insomnia   . Obstructive sleep apnea 12/2009    01/26/2010 AHI 83/hr  . Diabetes mellitus without complication ([place])   . PUD (peptic ulcer disease)     1980s  . Hyperlipidemia   . RLS (restless legs syndrome)   . Asthma   . CHF (congestive heart failure) ([place])   . Colon polyps     30 colon polyps found on first colonoscopy    Medications:  Prescriptions prior to admission  Medication Sig Dispense Refill Last Dose  . albuterol-ipratropium (COMBIVENT) 18-103 MCG/ACT inhaler Inhale 2 puffs into the lungs every 6 (six) hours as needed. (Patient taking differently: Inhale 2 puffs into the lungs every 6 (six) hours as needed for wheezing or shortness of breath. ) 14.7 g 5 Past Week at Unknown time  . atorvastatin (LIPITOR) 40 MG tablet Take 1 tablet (40 mg total) by mouth daily. 30 tablet 11 07/18/2015 at Unknown time  . chlorthalidone (HYGROTON) 25 MG tablet TAKE 1 TABLET BY MOUTH AS NEEDED  FOR FLUID RETENTION 30 tablet 3 Past Week at Unknown time  . [place]OX 250 MCG tablet TAKE 1 TABLET BY MOUTH EVERY MORNING 30 tablet 6 07/19/2015 at Unknown time  . ELIQUIS 5 MG TABS tablet TAKE 1 TABLET BY MOUTH TWICE DAILY 60 tablet 3 07/19/2015 at Unknown time  . HYDROcodone-acetaminophen (NORCO/VICODIN) 5-325 MG tablet Take 0.5-1 tablets by mouth daily as needed. (Patient taking differently: Take 0.5-1 tablets by mouth daily as needed for moderate pain. ) 30 tablet 0 Past Week at Unknown time  . lisinopril (PRINIVIL,ZESTRIL) 5 MG tablet TAKE 1 TABLET BY MOUTH DAILY 90 tablet 3 07/18/2015 at Unknown time  . metFORMIN (GLUCOPHAGE) 500 MG tablet TAKE 2 TABLETS BY MOUTH TWICE DAILY WITH A MEAL 120 tablet 0 07/19/2015 at Unknown time  . Multiple Vitamins-Minerals (MULTIVITAMIN WITH MINERALS) tablet Take 1 tablet by mouth daily.     07/19/2015 at Unknown time  . nitroGLYCERIN (NITROSTAT) 0.4 MG SL tablet Place 1 tablet (0.4 mg total) under the tongue every 5 (five) minutes as needed. (Patient taking differently: Place 0.4 mg under the tongue every 5 (five) minutes as needed for chest pain. ) 25 tablet 3 07/19/2015 at Unknown time  . Omega-3 Fatty Acids (FISH OIL PO) Take 1 tablet by mouth daily.   07/19/2015 at Unknown time  . omeprazole (PRILOSEC) 20 MG capsule TAKE 1 CAPSULE(20 MG) BY MOUTH DAILY AS NEEDED FOR ACID REFLUX 30 capsule 5 07/18/2015 at Unknown time  .  rOPINIRole (REQUIP) 2 MG tablet TAKE 1 TABLET BY MOUTH EVERY NIGHT AT BEDTIME 30 tablet 5 07/19/2015 at Unknown time  . sodium chloride (OCEAN) 0.65 % SOLN nasal spray Place 1 spray into both nostrils as needed for congestion.   07/19/2015 at Unknown time  . traZODone (DESYREL) 50 MG tablet Take 2 tablets (100 mg total) by mouth at bedtime. 60 tablet 5 07/19/2015 at Unknown time  . VIAGRA 100 MG tablet TAKE 1 TABLET BY MOUTH EVERY DAY AS NEEDED FOR ERECTILE DYSFUNCTION 6 tablet 1 unknown at unknown  . rOPINIRole (REQUIP) 2 MG tablet TAKE 1 TABLET  BY MOUTH EVERY NIGHT AT BEDTIME (Patient not taking: Reported on 07/19/2015) 30 tablet 5 Not Taking at Unknown time  . TAZTIA XT 360 MG 24 hr capsule TAKE 1 CAPSULE BY MOUTH EVERY DAY (Patient not taking: Reported on 07/19/2015) 30 capsule 6 Not Taking at Unknown time  . triamcinolone cream (KENALOG) 0.1 % Apply 1 application topically 2 (two) times daily. (Patient not taking: Reported on 07/19/2015) 60 g 1 Not Taking at Unknown time   Scheduled:  . aspirin  325 mg Oral Daily  . atorvastatin  40 mg Oral q1800  . chlorthalidone  25 mg Oral Daily  . digoxin  250 mcg Oral q morning - 10a  . lisinopril  5 mg Oral Daily  . metFORMIN  500 mg Oral BID WC  . multivitamin with minerals  1 tablet Oral Daily  . pantoprazole  40 mg Oral Daily  . rOPINIRole  2 mg Oral QHS  . sodium chloride  3 mL Intravenous Q12H  . sodium chloride  3 mL Intravenous Q12H  . traZODone  100 mg Oral QHS    Assessment: 59yo male c/o left-sided CP radiating to left neck/jaw/shoulder, had recent stress test, admitted for USAP, to begin heparin when Eliquis for Afib clears in preparation for cath likely on Tuesday; pt states last dose was taken 11/20 at 0530, MD requests to wait for heparin until am.  Goal of Therapy:  Heparin level 0.3-0.7 units/ml aPTT 66-102 seconds Monitor platelets by anticoagulation protocol: Yes   Plan:  Will obtain baseline heparin level w/ am labs, expect to need to use PTT for dosing initially.  Will start heparin gtt at 1300 units/hr at 0600 this am and monitor heparin levels, PTT, and CBC.  Wynona Neat, PharmD, BCPS  07/20/2015,12:52 AM

## 2015-07-20 NOTE — Progress Notes (Signed)
    Subjective:  Denies CP or dyspnea   Objective:  Filed Vitals:   07/20/15 0724 07/20/15 1127 07/20/15 1129 07/20/15 1552  BP: 155/85 154/86  148/97  Pulse: 88 76 77 78  Temp: 97.8 F (36.6 C) 97.9 F (36.6 C)  98.1 F (36.7 C)  TempSrc: Oral Oral  Oral  Resp: 16 15  15   Height:      Weight:      SpO2: 97% 98%  99%    Intake/Output from previous day:  Intake/Output Summary (Last 24 hours) at 07/20/15 1649 Last data filed at 07/20/15 1605  Gross per 24 hour  Intake  308.9 ml  Output    450 ml  Net -141.1 ml    Physical Exam: Physical exam: Well-developed well-nourished in no acute distress.  Skin is warm and dry.  HEENT is normal.  Neck is supple. No thyromegaly.  Chest is clear to auscultation with normal expansion.  Cardiovascular exam is regular rate and rhythm.  Abdominal exam nontender or distended. No masses palpated. Extremities show no edema. neuro grossly intact    Lab Results: Basic Metabolic Panel:  Recent Labs  07/19/15 1731 07/20/15 0114  NA 139  --   K 4.0  --   CL 105  --   CO2 24  --   GLUCOSE 118*  --   BUN 12  --   CREATININE 0.88  --   CALCIUM 8.6*  --   MG  --  1.8  PHOS  --  3.4   CBC:  Recent Labs  07/19/15 1731 07/20/15 0114  WBC 6.7 8.6  NEUTROABS 4.4 5.4  HGB 11.9* 11.4*  HCT 36.8* 35.4*  MCV 78.5 78.5  PLT 108* 187   Cardiac Enzymes:  Recent Labs  07/20/15 0114  TROPONINI 0.03     Assessment/Plan:  1 unstable angina-the patient presents with chest pain that is concerning. He also had a recent nuclear study that was abnormal. Apixaban on hold. Continue IV heparin. Proceed with cardiac catheterization in AM. The risks and benefits were discussed and he agrees to proceed. Continue ASA. Continue Lipitor. 2 permanent atrial fibrillation-the patient is in atrial fibrillation and rate mildly elevated at times. Add cardizem 30 mg every 6 and increase as needed. Continue digoxin. Hold apixaban as outlined above.  We will resume once all procedures are complete. 3 hypertension-BP elevated; add cardizem and follow. 4 hyperlipidemia-continue statin. 5 diabetes mellitus-management per primary care. Hold Glucophage now and for 48 hours following catheterization.  Kirk Ruths 07/20/2015, 4:49 PM

## 2015-07-20 NOTE — H&P (Signed)
[facility] History and Physical  Shaun [name] [id number] DOB: Dec 22, 1955 DOA: 07/19/2015  Referring physician: Nathaniel Man, MD PCP: Mickie Hillier, MD   Chief Complaint: Chest pain  HPI: Shaun [name] is a 59 y.o. male with a past medical history of atrial fibrillation, hypertension, diastolic CHF, mild CAD on the cardiac cath done in 2000 treated medically, hyperlipidemia, type 2 diabetes, sleep apnea, COPD, restless leg syndrome who comes to the emergency department due to chest pain that woke him up from sleep around 12:30 PM described as a pressure, radiates to his back neck and left shoulder, associated with dyspnea, palpitations, mild dizziness and nausea. He denies diaphoresis, orthopnea or pitting edema all the lower extremities. Earlier this month the patient had a echocardiogram with an ejection fraction of 60-65% and indeterminate diastolic dysfunction. Nuclear study done earlier showed an ejection fraction of 60% with a small partially reversible defect in the RCA distribution.   Patient is being admitted to rule out ischemia and perform cardiac catheterization on Tuesday.   Review of Systems:  Constitutional:  No weight loss, night sweats, Fevers, chills, fatigue.  HEENT:  No headaches, Difficulty swallowing,Tooth/dental problems,Sore throat,  No sneezing, itching, ear ache, nasal congestion, post nasal drip,  Cardio-vascular:  As above mentioned. GI:  No heartburn, indigestion, abdominal pain, nausea, vomiting, diarrhea, change in bowel habits, loss of appetite  Resp:  No shortness of breath with exertion or at rest. No excess mucus, no productive cough, No non-productive cough, No coughing up of blood.No change in color of mucus.No wheezing.No chest wall deformity  Skin:  no rash or lesions.  GU:  no dysuria, change in color of urine, no urgency or frequency. No flank pain.  Musculoskeletal:  No joint pain or swelling. No decreased range of  motion. No back pain.  Psych:  No change in mood or affect. No depression or anxiety. No memory loss. Positive frequent insomnia.  Past Medical History  Diagnosis Date  . Paroxysmal atrial fibrillation ([place]) 10/2004    Onset in 10/2004; recurred 09/2008  . Arteriosclerotic cardiovascular disease (ASCVD)     Nonobstructive; cath in 2000: 30-40% mid LAD and proximal RCA;normal EF. stress nuclear in 2006-subtle inferoseptal hypoperfusion with reversibility; negative stress EKG; good exercise tolerance  . Hypertension   . Sinusitis   . Insomnia   . Obstructive sleep apnea 12/2009    01/26/2010 AHI 83/hr  . Diabetes mellitus without complication ([place])   . PUD (peptic ulcer disease)     1980s  . Hyperlipidemia   . RLS (restless legs syndrome)   . Asthma   . CHF (congestive heart failure) ([place])   . Colon polyps     30 colon polyps found on first colonoscopy   Past Surgical History  Procedure Laterality Date  . Hernia repair  1986    Left inguinal  . Rotator cuff repair      Right  . Colonoscopy N/A 05/19/2014    Dr. Barnie Alderman diverticulosis/moderate external hemorrhoids, >20 simple adenomas. Genetic screening negative.    Social History:  reports that he quit smoking about 2 years ago. His smoking use included Cigarettes. He started smoking about 45 years ago. He has a 32 pack-year smoking history. He has never used smokeless tobacco. He reports that he does not drink alcohol or use illicit drugs.  No Known Allergies  Family History  Problem Relation Age of Onset  . Hypertension Mother   . Breast cancer Mother 24  . Heart attack Father   .  Diabetes Brother   . Colon cancer Neg Hx   . Skin cancer Sister 3  . Brain cancer Maternal Uncle   . Cancer Maternal Uncle     NOS  . Breast cancer Cousin     maternal cousin dx <50    Prior to Admission medications   Medication Sig Start Date End Date Taking? Authorizing Provider  albuterol-ipratropium (COMBIVENT) 18-103 MCG/ACT  inhaler Inhale 2 puffs into the lungs every 6 (six) hours as needed. Patient taking differently: Inhale 2 puffs into the lungs every 6 (six) hours as needed for wheezing or shortness of breath.  04/01/13  Yes Kathyrn Drown, MD  atorvastatin (LIPITOR) 40 MG tablet Take 1 tablet (40 mg total) by mouth daily. 05/07/14  Yes Arnoldo Lenis, MD  chlorthalidone (HYGROTON) 25 MG tablet TAKE 1 TABLET BY MOUTH AS NEEDED FOR FLUID RETENTION 05/22/15  Yes Arnoldo Lenis, MD  Porcupine 250 MCG tablet TAKE 1 TABLET BY MOUTH EVERY MORNING 01/06/15  Yes Arnoldo Lenis, MD  ELIQUIS 5 MG TABS tablet TAKE 1 TABLET BY MOUTH TWICE DAILY 05/06/15  Yes Arnoldo Lenis, MD  HYDROcodone-acetaminophen (NORCO/VICODIN) 5-325 MG tablet Take 0.5-1 tablets by mouth daily as needed. Patient taking differently: Take 0.5-1 tablets by mouth daily as needed for moderate pain.  06/18/15  Yes Mikey Kirschner, MD  lisinopril (PRINIVIL,ZESTRIL) 5 MG tablet TAKE 1 TABLET BY MOUTH DAILY 04/07/15  Yes Arnoldo Lenis, MD  metFORMIN (GLUCOPHAGE) 500 MG tablet TAKE 2 TABLETS BY MOUTH TWICE DAILY WITH A MEAL 01/05/15  Yes Mikey Kirschner, MD  Multiple Vitamins-Minerals (MULTIVITAMIN WITH MINERALS) tablet Take 1 tablet by mouth daily.     Yes Historical Provider, MD  nitroGLYCERIN (NITROSTAT) 0.4 MG SL tablet Place 1 tablet (0.4 mg total) under the tongue every 5 (five) minutes as needed. Patient taking differently: Place 0.4 mg under the tongue every 5 (five) minutes as needed for chest pain.  06/30/15  Yes Lendon Colonel, NP  Omega-3 Fatty Acids (FISH OIL PO) Take 1 tablet by mouth daily.   Yes Historical Provider, MD  omeprazole (PRILOSEC) 20 MG capsule TAKE 1 CAPSULE(20 MG) BY MOUTH DAILY AS NEEDED FOR ACID REFLUX 06/18/15  Yes Mikey Kirschner, MD  rOPINIRole (REQUIP) 2 MG tablet TAKE 1 TABLET BY MOUTH EVERY NIGHT AT BEDTIME 12/26/14  Yes Mikey Kirschner, MD  sodium chloride (OCEAN) 0.65 % SOLN nasal spray Place 1 spray into both nostrils  as needed for congestion.   Yes Historical Provider, MD  traZODone (DESYREL) 50 MG tablet Take 2 tablets (100 mg total) by mouth at bedtime. 12/26/14  Yes Mikey Kirschner, MD  VIAGRA 100 MG tablet TAKE 1 TABLET BY MOUTH EVERY DAY AS NEEDED FOR ERECTILE DYSFUNCTION 04/23/15  Yes Arnoldo Lenis, MD  rOPINIRole (REQUIP) 2 MG tablet TAKE 1 TABLET BY MOUTH EVERY NIGHT AT BEDTIME Patient not taking: Reported on 07/19/2015 07/01/15   Mikey Kirschner, MD  TAZTIA XT 360 MG 24 hr capsule TAKE 1 CAPSULE BY MOUTH EVERY DAY Patient not taking: Reported on 07/19/2015 12/22/14   Lendon Colonel, NP  triamcinolone cream (KENALOG) 0.1 % Apply 1 application topically 2 (two) times daily. Patient not taking: Reported on 07/19/2015 06/30/14   Mikey Kirschner, MD   Physical Exam: Filed Vitals:   07/19/15 2045 07/19/15 2100 07/19/15 2134 07/20/15 0020  BP: 149/92 149/94 159/103 141/90  Pulse: 85 96 87 89  Temp:   98.7 F (37.1  C) 97.8 F (36.6 C)  TempSrc:   Oral Oral  Resp: 23 17 20 16   Height:   5\' 8"  (1.727 m)   Weight:   99.882 kg (220 lb 3.2 oz)   SpO2: 96% 98% 97% 99%    Wt Readings from Last 3 Encounters:  07/19/15 99.882 kg (220 lb 3.2 oz)  06/30/15 101.606 kg (224 lb)  06/18/15 100.336 kg (221 lb 3.2 oz)    General:  Appears calm and comfortable Eyes: PERRL, normal lids, irises & conjunctiva ENT: grossly normal hearing, lips & tongue Neck: no LAD, masses or thyromegaly Cardiovascular: Irregularly irregular, no m/r/g. No LE edema. Telemetry: Atrial fibrillation Respiratory: CTA bilaterally, no w/r/r. Normal respiratory effort. Abdomen: soft, ntnd Skin: no rash or induration seen on limited exam Musculoskeletal: grossly normal tone BUE/BLE Psychiatric: grossly normal mood and affect, speech fluent and appropriate Neurologic: grossly non-focal.          Labs on Admission:  Basic Metabolic Panel:  Recent Labs Lab 07/19/15 1731  NA 139  K 4.0  CL 105  CO2 24  GLUCOSE 118*    BUN 12  CREATININE 0.88  CALCIUM 8.6*   CBC:  Recent Labs Lab 07/19/15 1731  WBC 6.7  NEUTROABS 4.4  HGB 11.9*  HCT 36.8*  MCV 78.5  PLT 108*    BNP (last 3 results)  Recent Labs  10/09/14 2159  BNP 52.0    CBG:  Recent Labs Lab 07/19/15 2144  GLUCAP 102*    Radiological Exams on Admission: Dg Chest 2 View  07/19/2015  CLINICAL DATA:  Left-sided chest pain with onset earlier today. Initial encounter. EXAM: CHEST  2 VIEW COMPARISON:  10/09/2014 and 08/30/2013 radiographs. FINDINGS: The heart size and mediastinal contours are stable. There is stable mild asymmetric elevation of the right hemidiaphragm. The lungs are clear. There is no pleural effusion or pneumothorax. No acute osseous findings are demonstrated. There is evidence of previous distal right clavicle resection. IMPRESSION: Stable chest.  No active cardiopulmonary process. Electronically Signed   By: Richardean Sale M.D.   On: 07/19/2015 17:45    Echocardiogram 07/09/2015 LV EF: 60% -  65%  ------------------------------------------------------------------- Indications:   Hypertension. Precordial pain 786.51.  ------------------------------------------------------------------- History:  PMH: OSA  Atrial fibrillation. Risk factors: Diabetes mellitus. Dyslipidemia.  ------------------------------------------------------------------- Study Conclusions  - Left ventricle: The cavity size was normal. Wall thickness was increased in a pattern of moderate LVH. Systolic function was normal. The estimated ejection fraction was in the range of 60% to 65%. Wall motion was normal; there were no regional wall motion abnormalities. The study is not technically sufficient to allow evaluation of LV diastolic function. - Mitral valve: Mildly thickened leaflets . There was mild regurgitation. - Left atrium: The atrium was mildly dilated. - Right atrium: Central venous pressure (est): 3 mm  Hg. - Tricuspid valve: There was trivial regurgitation. - Pulmonary arteries: Systolic pressure could not be accurately estimated. - Pericardium, extracardiac: A trivial pericardial effusion was identified.  Impressions:  - Moderate LVH with LVEF 60-65%. Indeterminate diastolic function. Mild left atrial enlargement. Mildly thickened mitral leaflets with mild mitral regurgitation. Trivial tricuspid regurgitation. Trivial pericardial effusion.  Nuclear medicine: Study Result      No diagnostic ST segment abnormalities to indicate ischemia with Lexiscan. Atrial fibrillation present throughout.  Small, moderate intensity, apical to basal inferior defect is partially reversible and suggestive of ischemia most likely in the RCA distribution.  Low to intermediate risk.  Nuclear stress EF: 60%.  EKG: Independently reviewed. Vent. rate 84 BPM PR interval * ms QRS duration 102 ms QT/QTc 332/392 ms P-R-T axes -1 35 16 Atrial fibrillation Probable anteroseptal infarct, old Minimal ST depression, diffuse leads  Assessment/Plan Principal Problem:   Unstable angina (HCC)   Acute chest pain Admit to telemetry. Hold Eliquis.   Active Problems:   Atrial fibrillation (HCC) Continue digoxin for rate control. Digoxin level.    Obstructive sleep apnea Continue nocturnal CPAP. His relatives will bring his own CPAP machine from home.    Hypertension Continue current antihypertensive therapy. Monitor blood pressure.    Diabetes mellitus ([place]) Continue metformin.  CBG monitoring while patient is in the hospital. Start regular insulin sliding scale if needed.     COPD (chronic obstructive pulmonary disease) (HCC) Continue bronchodilators as needed.   Cardiology has been consulted by the emergency department.  Code Status: Full code. DVT Prophylaxis:On full anticoagulation at home. Family Communication: Relatives were at bedside. Disposition Plan: Admit to  the hospital for cardiac catheterization on Tuesday.  Time spent: Over 70 minutes were spent during the process of this admission.  Reubin Milan [facility] Pager [phone].

## 2015-07-20 NOTE — Progress Notes (Signed)
ANTICOAGULATION CONSULT NOTE - Follow Up Consult  Pharmacy Consult for Heparin Indication: chest pain/ACS and atrial fibrillation  No Known Allergies  Patient Measurements: Height: 5\' 8"  (172.7 cm) Weight: 219 lb 8 oz (99.565 kg) IBW/kg (Calculated) : 68.4 Heparin Dosing Weight: 90 kg  Vital Signs: Temp: 98.1 F (36.7 C) (11/21 1552) Temp Source: Oral (11/21 1552) BP: 150/83 mmHg (11/21 1757) Pulse Rate: 78 (11/21 1552)  Labs:  Recent Labs  07/19/15 1731 07/20/15 0114 07/20/15 1410 07/20/15 1640 07/20/15 2205  HGB 11.9* 11.4*  --   --   --   HCT 36.8* 35.4*  --   --   --   PLT 108* 187  --   --   --   APTT  --  28 45*  --  63*  LABPROT  --  14.7  --   --   --   INR  --  1.13  --   --   --   HEPARINUNFRC  --  0.63  --   --   --   CREATININE 0.88  --   --   --   --   TROPONINI  --  0.03  --  0.03  --     Estimated Creatinine Clearance: 104.7 mL/min (by C-G formula based on Cr of 0.88).  Assessment:   2nd aPTT is subtherapeutic (63 seconds) on heparin drip at 1500 units/hr.  Last Eliquis dose 11/19 at 0530am.  On hold for cardiac cath, expected on 07/21/15.  Checking aPTTs since Eliquis skews heparin levels.  Goal of Therapy:  Heparin level 0.3-0.7 units/ml aPTT 66-102 seconds Monitor platelets by anticoagulation protocol: Yes   Plan:    Increase heparin drip to 1600 units/hr.   Next HL with AM labs (~ 6 hrs)   Daily aPTT and CBC while on heparin.     Heparin level in am to see if it's correlating with aPTT.   Monitor for s/sx of bleeding  Levester Fresh, PharmD, BCPS, [facility] Clinical Pharmacist Pager [phone] 07/20/2015 10:42 PM

## 2015-07-20 NOTE — Care Management Note (Signed)
Case Management Note  Patient Details  Name: BRYER ROLLI MRN: [id number] Date of Birth: Aug 12, 1956  Subjective/Objective:      Pt admitted for cp. Holding Apixaban for washout and plan for cath 07-21-15.               Action/Plan: CM will continue to monitor for disposition needs.   Expected Discharge Date:  07/21/15               Expected Discharge Plan:  Home/Self Care  In-House Referral:  NA  Discharge planning Services  CM Consult  Post Acute Care Choice:    Choice offered to:     DME Arranged:    DME Agency:     HH Arranged:    HH Agency:     Status of Service:  In process, will continue to follow  Medicare Important Message Given:    Date Medicare IM Given:    Medicare IM give by:    Date Additional Medicare IM Given:    Additional Medicare Important Message give by:     If discussed at [place] of Stay Meetings, dates discussed:    Additional Comments:  Bethena Roys, RN 07/20/2015, 1:54 PM

## 2015-07-20 NOTE — Progress Notes (Signed)
Patient watching Cath Video

## 2015-07-20 NOTE — Progress Notes (Signed)
ANTICOAGULATION CONSULT NOTE - Follow Up Consult  Pharmacy Consult for Heparin Indication: chest pain/ACS and atrial fibrillation  No Known Allergies  Patient Measurements: Height: 5\' 8"  (172.7 cm) Weight: 219 lb 8 oz (99.565 kg) IBW/kg (Calculated) : 68.4 Heparin Dosing Weight: 90 kg  Vital Signs: Temp: 97.9 F (36.6 C) (11/21 1127) Temp Source: Oral (11/21 1127) BP: 154/86 mmHg (11/21 1127) Pulse Rate: 77 (11/21 1129)  Labs:  Recent Labs  07/19/15 1731 07/20/15 0114 07/20/15 1410  HGB 11.9* 11.4*  --   HCT 36.8* 35.4*  --   PLT 108* 187  --   APTT  --  28 45*  LABPROT  --  14.7  --   INR  --  1.13  --   HEPARINUNFRC  --  0.63  --   CREATININE 0.88  --   --   TROPONINI  --  0.03  --     Estimated Creatinine Clearance: 104.7 mL/min (by C-G formula based on Cr of 0.88).  Assessment:   Initial aPTT is subtherapeutic (45 seconds) on heparin drip at 1300 units/hr.  Last Eliquis dose 11/19 at 0530am.  On hold for cardiac cath, expected on 07/21/15.  Checking aPTTs since Eliquis skews heparin levels.  Goal of Therapy:  Heparin level 0.3-0.7 units/ml aPTT 66-102 seconds Monitor platelets by anticoagulation protocol: Yes   Plan:    Increase heparin drip to 1500 units/hr.   Next aPTT in ~ 6hrs.   Daily aPTT and CBC while on heparin.     Heparin level in am to see if it's correlating with aPTT.  Arty Baumgartner, [place] Pager: [phone] 07/20/2015,3:40 PM

## 2015-07-20 NOTE — Progress Notes (Signed)
[facility] PLAN OF CARE NOTE  Patient: Shaun [name]   [id number]  PCP: Mickie Hillier, MD   DOB: 11-09-1955  DOA: 07/19/2015   DOS: 07/20/2015   Plan of care: Patient was admitted earlier in the morning on 07/20/2015. Presented with complaint of chest pain, suspicious for unstable angina. Cardiology consulted and the patient is planned to have a cardiac catheterization on 07/21/2015. Currently on heparin. Ellik was on hold. We'll also hold metformin and place the patient on sliding scale. Continuing rest of the medication and as mentioned by Dr. Tennis Must in his H&P.  Author: Berle Mull, MD [facility] Pager: [phone] 07/20/2015 2:42 PM   If 7PM-7AM, please contact night-coverage at [email or web address], password [facility]

## 2015-07-21 ENCOUNTER — Encounter (HOSPITAL_COMMUNITY)
Admission: EM | Disposition: A | Discharge status: Home / Self Care | Payer: Self-pay | Source: Home / Self Care | Attending: Internal Medicine

## 2015-07-21 DIAGNOSIS — G4733 Obstructive sleep apnea (adult) (pediatric): Secondary | ICD-10-CM

## 2015-07-21 DIAGNOSIS — I10 Essential (primary) hypertension: Secondary | ICD-10-CM

## 2015-07-21 DIAGNOSIS — R079 Chest pain, unspecified: Secondary | ICD-10-CM

## 2015-07-21 DIAGNOSIS — I482 Chronic atrial fibrillation: Secondary | ICD-10-CM

## 2015-07-21 DIAGNOSIS — I25110 Atherosclerotic heart disease of native coronary artery with unstable angina pectoris: Principal | ICD-10-CM

## 2015-07-21 HISTORY — PX: CARDIAC CATHETERIZATION: SHX172

## 2015-07-21 LAB — GLUCOSE, CAPILLARY
GLUCOSE-CAPILLARY: 132 mg/dL — AB (ref 65–99)
GLUCOSE-CAPILLARY: 111 mg/dL — AB (ref 65–99)
GLUCOSE-CAPILLARY: 131 mg/dL — AB (ref 65–99)
Glucose-Capillary: 94 mg/dL (ref 65–99)

## 2015-07-21 LAB — CBC
HCT: 37.4 % — ABNORMAL LOW (ref 39.0–52.0)
Hemoglobin: 11.9 g/dL — ABNORMAL LOW (ref 13.0–17.0)
MCH: 25 pg — ABNORMAL LOW (ref 26.0–34.0)
MCHC: 31.8 g/dL (ref 30.0–36.0)
MCV: 78.6 fL (ref 78.0–100.0)
PLATELETS: 189 10*3/uL (ref 150–400)
RBC: 4.76 MIL/uL (ref 4.22–5.81)
RDW: 14.5 % (ref 11.5–15.5)
WBC: 8.6 10*3/uL (ref 4.0–10.5)

## 2015-07-21 LAB — BASIC METABOLIC PANEL
Anion gap: 10 (ref 5–15)
BUN: 16 mg/dL (ref 6–20)
CALCIUM: 9 mg/dL (ref 8.9–10.3)
CHLORIDE: 102 mmol/L (ref 101–111)
CO2: 25 mmol/L (ref 22–32)
CREATININE: 0.97 mg/dL (ref 0.61–1.24)
GFR calc non Af Amer: >60 mL/min (ref >60)
Glucose, Bld: 139 mg/dL — ABNORMAL HIGH (ref 65–99)
Potassium: 3.9 mmol/L (ref 3.5–5.1)
SODIUM: 137 mmol/L (ref 135–145)

## 2015-07-21 LAB — PROTIME-INR
INR: 1.09 (ref 0.00–1.49)
PROTHROMBIN TIME: 14.3 s (ref 11.6–15.2)

## 2015-07-21 LAB — POCT ACTIVATED CLOTTING TIME: Activated Clotting Time: 663 seconds

## 2015-07-21 LAB — MAGNESIUM: Magnesium: 1.9 mg/dL (ref 1.7–2.4)

## 2015-07-21 LAB — HEPARIN LEVEL (UNFRACTIONATED): HEPARIN UNFRACTIONATED: 0.62 [IU]/mL (ref 0.30–0.70)

## 2015-07-21 SURGERY — LEFT HEART CATH AND CORONARY ANGIOGRAPHY

## 2015-07-21 MED ORDER — SODIUM CHLORIDE 0.9 % IV SOLN: 250.0000 mL | INTRAVENOUS | Status: DC | PRN

## 2015-07-21 MED ORDER — ONDANSETRON HCL 4 MG/2ML IJ SOLN
INTRAMUSCULAR | Status: AC
  Filled 2015-07-21: qty 2

## 2015-07-21 MED ORDER — ANGIOPLASTY BOOK
Freq: Once | Status: AC
  Administered 2015-07-21: 21:00:00
  Filled 2015-07-21: qty 1

## 2015-07-21 MED ORDER — FENTANYL CITRATE (PF) 100 MCG/2ML IJ SOLN
INTRAMUSCULAR | Status: AC
  Filled 2015-07-21: qty 2

## 2015-07-21 MED ORDER — BIVALIRUDIN 250 MG IV SOLR
INTRAVENOUS | Status: AC
  Filled 2015-07-21: qty 250

## 2015-07-21 MED ORDER — HEPARIN SODIUM (PORCINE) 1000 UNIT/ML IJ SOLN
INTRAMUSCULAR | Status: DC | PRN
  Administered 2015-07-21: 5000 [IU] via INTRAVENOUS

## 2015-07-21 MED ORDER — LIDOCAINE HCL (PF) 1 % IJ SOLN
INTRAMUSCULAR | Status: AC
  Filled 2015-07-21: qty 30

## 2015-07-21 MED ORDER — CLOPIDOGREL BISULFATE 300 MG PO TABS
ORAL_TABLET | ORAL | Status: DC | PRN
  Administered 2015-07-21: 600 mg via ORAL

## 2015-07-21 MED ORDER — SODIUM CHLORIDE 0.9 % IJ SOLN: 3.0000 mL | Freq: Two times a day (BID) | INTRAMUSCULAR | Status: DC

## 2015-07-21 MED ORDER — HEPARIN SODIUM (PORCINE) 1000 UNIT/ML IJ SOLN
INTRAMUSCULAR | Status: AC
  Filled 2015-07-21: qty 1

## 2015-07-21 MED ORDER — SODIUM CHLORIDE 0.9 % IJ SOLN: 3.0000 mL | INTRAMUSCULAR | Status: DC | PRN

## 2015-07-21 MED ORDER — BIVALIRUDIN BOLUS VIA INFUSION - CUPID
INTRAVENOUS | Status: DC | PRN
  Administered 2015-07-21: 73.725 mg via INTRAVENOUS

## 2015-07-21 MED ORDER — IOHEXOL 350 MG/ML SOLN
INTRAVENOUS | Status: DC | PRN
  Administered 2015-07-21: 175 mL via INTRACARDIAC

## 2015-07-21 MED ORDER — MIDAZOLAM HCL 2 MG/2ML IJ SOLN
INTRAMUSCULAR | Status: DC | PRN
  Administered 2015-07-21 (×2): 1 mg via INTRAVENOUS

## 2015-07-21 MED ORDER — HEPARIN (PORCINE) IN NACL 2-0.9 UNIT/ML-% IJ SOLN
INTRAMUSCULAR | Status: AC
  Filled 2015-07-21: qty 1000

## 2015-07-21 MED ORDER — FENTANYL CITRATE (PF) 100 MCG/2ML IJ SOLN
INTRAMUSCULAR | Status: DC | PRN
  Administered 2015-07-21: 50 ug via INTRAVENOUS
  Administered 2015-07-21 (×3): 25 ug via INTRAVENOUS

## 2015-07-21 MED ORDER — SODIUM CHLORIDE 0.9 % WEIGHT BASED INFUSION: 3.0000 mL/kg/h | INTRAVENOUS | Status: AC

## 2015-07-21 MED ORDER — NITROGLYCERIN 1 MG/10 ML FOR IR/CATH LAB
INTRA_ARTERIAL | Status: AC
  Filled 2015-07-21: qty 10

## 2015-07-21 MED ORDER — VERAPAMIL HCL 2.5 MG/ML IV SOLN
INTRAVENOUS | Status: DC | PRN
  Administered 2015-07-21: 17:00:00 via INTRA_ARTERIAL
  Administered 2015-07-21: 2 mL via INTRA_ARTERIAL

## 2015-07-21 MED ORDER — ASPIRIN 81 MG PO CHEW
81.0000 mg | CHEWABLE_TABLET | Freq: Every day | ORAL | Status: DC
  Administered 2015-07-22: 09:00:00 81 mg via ORAL
  Filled 2015-07-21: qty 1

## 2015-07-21 MED ORDER — MIDAZOLAM HCL 2 MG/2ML IJ SOLN
INTRAMUSCULAR | Status: AC
  Filled 2015-07-21: qty 2

## 2015-07-21 MED ORDER — VERAPAMIL HCL 2.5 MG/ML IV SOLN
INTRAVENOUS | Status: AC
  Filled 2015-07-21: qty 2

## 2015-07-21 MED ORDER — SODIUM CHLORIDE 0.9 % IV SOLN
250.0000 mg | INTRAVENOUS | Status: DC | PRN
  Administered 2015-07-21: 1.75 mg/kg/h via INTRAVENOUS

## 2015-07-21 MED ORDER — DILTIAZEM HCL ER COATED BEADS 120 MG PO CP24
120.0000 mg | ORAL_CAPSULE | Freq: Every day | ORAL | Status: DC
  Administered 2015-07-22: 11:00:00 120 mg via ORAL
  Filled 2015-07-21: qty 1

## 2015-07-21 MED ORDER — CLOPIDOGREL BISULFATE 75 MG PO TABS
75.0000 mg | ORAL_TABLET | Freq: Every day | ORAL | Status: DC
  Administered 2015-07-22: 09:00:00 75 mg via ORAL
  Filled 2015-07-21: qty 1

## 2015-07-21 MED ORDER — LIVING WELL WITH DIABETES BOOK
Freq: Once | Status: AC
  Administered 2015-07-21: 21:00:00
  Filled 2015-07-21: qty 1

## 2015-07-21 MED ORDER — NITROGLYCERIN 1 MG/10 ML FOR IR/CATH LAB
INTRA_ARTERIAL | Status: DC | PRN
  Administered 2015-07-21: 18:00:00

## 2015-07-21 MED ORDER — ONDANSETRON HCL 4 MG/2ML IJ SOLN
INTRAMUSCULAR | Status: DC | PRN
  Administered 2015-07-21: 4 mg via INTRAVENOUS

## 2015-07-21 SURGICAL SUPPLY — 20 items
BALLN EMERGE MR 2.0X12 (BALLOONS) ×2
BALLN ~~LOC~~ EMERGE MR 2.75X12 (BALLOONS) ×2
BALLOON EMERGE MR 2.0X12 (BALLOONS) ×1 IMPLANT
BALLOON ~~LOC~~ EMERGE MR 2.75X12 (BALLOONS) ×1 IMPLANT
CATH INFINITI 5 FR JL3.5 (CATHETERS) ×2 IMPLANT
CATH INFINITI 5FR ANG PIGTAIL (CATHETERS) ×2 IMPLANT
CATH INFINITI JR4 5F (CATHETERS) ×2 IMPLANT
DEVICE RAD COMP TR BAND LRG (VASCULAR PRODUCTS) ×2 IMPLANT
GLIDESHEATH SLEND SS 6F .021 (SHEATH) ×2 IMPLANT
GUIDE CATH RUNWAY 6FR CLS3.5 (CATHETERS) ×2 IMPLANT
KIT ENCORE 26 ADVANTAGE (KITS) ×2 IMPLANT
KIT HEART LEFT (KITS) ×2 IMPLANT
PACK CARDIAC CATHETERIZATION (CUSTOM PROCEDURE TRAY) ×2 IMPLANT
STENT SYNERGY DES 2.5X24 (Permanent Stent) ×2 IMPLANT
SYR MEDRAD MARK V 150ML (SYRINGE) ×2 IMPLANT
TRANSDUCER W/STOPCOCK (MISCELLANEOUS) ×2 IMPLANT
TUBING CIL FLEX 10 FLL-RA (TUBING) ×2 IMPLANT
WIRE ASAHI PROWATER 180CM (WIRE) ×2 IMPLANT
WIRE PT2 MS 185 (WIRE) ×2 IMPLANT
WIRE SAFE-T 1.5MM-J .035X260CM (WIRE) ×2 IMPLANT

## 2015-07-21 NOTE — H&P (View-Only) (Signed)
    Subjective:  Denies CP or dyspnea   Objective:  Filed Vitals:   07/21/15 0052 07/21/15 0507 07/21/15 0611 07/21/15 0727  BP: 117/74 128/71 138/72 134/74  Pulse: 89 59  71  Temp: 97.5 F (36.4 C) 97.8 F (36.6 C)  97.8 F (36.6 C)  TempSrc: Oral Oral  Oral  Resp:  18  17  Height:      Weight:  98.294 kg (216 lb 11.2 oz)    SpO2: 98% 96%  97%    Intake/Output from previous day:  Intake/Output Summary (Last 24 hours) at 07/21/15 1035 Last data filed at 07/20/15 2324  Gross per 24 hour  Intake 1191.9 ml  Output    900 ml  Net  291.9 ml    Physical Exam: Physical exam: Well-developed well-nourished in no acute distress.  Skin is warm and dry.  HEENT is normal.  Neck is supple.  Chest is clear to auscultation with normal expansion.  Cardiovascular exam is irregular Abdominal exam nontender or distended. No masses palpated. Extremities show no edema. neuro grossly intact    Lab Results: Basic Metabolic Panel:  Recent Labs  07/19/15 1731 07/20/15 0114 07/21/15 0444  NA 139  --  137  K 4.0  --  3.9  CL 105  --  102  CO2 24  --  25  GLUCOSE 118*  --  139*  BUN 12  --  16  CREATININE 0.88  --  0.97  CALCIUM 8.6*  --  9.0  MG  --  1.8 1.9  PHOS  --  3.4  --    CBC:  Recent Labs  07/19/15 1731 07/20/15 0114 07/21/15 0444  WBC 6.7 8.6 8.6  NEUTROABS 4.4 5.4  --   HGB 11.9* 11.4* 11.9*  HCT 36.8* 35.4* 37.4*  MCV 78.5 78.5 78.6  PLT 108* 187 189   Cardiac Enzymes:  Recent Labs  07/20/15 0114 07/20/15 1640  TROPONINI 0.03 0.03     Assessment/Plan:  1 unstable angina-the patient presents with chest pain that is concerning. He also had a recent nuclear study that was abnormal. Apixaban on hold. Continue IV heparin. Proceed with cardiac catheterization. The risks and benefits were discussed and he agrees to proceed. Continue ASA. Continue Lipitor. 2 permanent atrial fibrillation-the patient is in atrial fibrillation and rate controlled.  Change cardizem to 120 mg daily. Continue digoxin. Hold apixaban as outlined above. We will resume once all procedures are complete. 3 hypertension-continue present meds. 4 hyperlipidemia-continue statin. 5 diabetes mellitus-management per primary care. Hold Glucophage now and for 48 hours following catheterization.  Kirk Ruths 07/21/2015, 10:35 AM

## 2015-07-21 NOTE — Interval H&P Note (Signed)
History and Physical Interval Note:  07/21/2015 4:31 PM  Shaun [name]  has presented today for surgery, with the diagnosis of cp  The various methods of treatment have been discussed with the patient and family. After consideration of risks, benefits and other options for treatment, the patient has consented to  Procedure(s): Left Heart Cath and Coronary Angiography (N/A) as a surgical intervention .  The patient's history has been reviewed, patient examined, no change in status, stable for surgery.  I have reviewed the patient's chart and labs.  Questions were answered to the patient's satisfaction.   Cath Lab Visit (complete for each Cath Lab visit)  Clinical Evaluation Leading to the Procedure:   ACS: Yes.    Non-ACS:    Anginal Classification: CCS IV  Anti-ischemic medical therapy: Minimal Therapy (1 class of medications)  Non-Invasive Test Results: Intermediate-risk stress test findings: cardiac mortality 1-3%/year  Prior CABG: No previous CABG       Collier [facility]ina [facility] 07/21/2015 4:31 PM

## 2015-07-21 NOTE — Progress Notes (Signed)
TR BAND REMOVAL  LOCATION:    right radial  DEFLATED PER PROTOCOL:    Yes.    TIME BAND OFF / DRESSING APPLIED:    21:45   SITE UPON ARRIVAL:    Level 0  SITE AFTER BAND REMOVAL:    Level 0  CIRCULATION SENSATION AND MOVEMENT:    Within Normal Limits   Yes.    COMMENTS:

## 2015-07-21 NOTE — Progress Notes (Signed)
Patient placed on CPAP of 7 HS. 2L O2 bleed in. Patient tolerating well. RT will continue to monitor as needed.

## 2015-07-21 NOTE — Progress Notes (Signed)
Patient placed on CPAP of 7 HS. No O2 bleed in needed. Patient tolerating well. RT will continue to monitor as needed.

## 2015-07-21 NOTE — Progress Notes (Signed)
    Subjective:  Denies CP or dyspnea   Objective:  Filed Vitals:   07/21/15 0052 07/21/15 0507 07/21/15 0611 07/21/15 0727  BP: 117/74 128/71 138/72 134/74  Pulse: 89 59  71  Temp: 97.5 F (36.4 C) 97.8 F (36.6 C)  97.8 F (36.6 C)  TempSrc: Oral Oral  Oral  Resp:  18  17  Height:      Weight:  98.294 kg (216 lb 11.2 oz)    SpO2: 98% 96%  97%    Intake/Output from previous day:  Intake/Output Summary (Last 24 hours) at 07/21/15 1035 Last data filed at 07/20/15 2324  Gross per 24 hour  Intake 1191.9 ml  Output    900 ml  Net  291.9 ml    Physical Exam: Physical exam: Well-developed well-nourished in no acute distress.  Skin is warm and dry.  HEENT is normal.  Neck is supple.  Chest is clear to auscultation with normal expansion.  Cardiovascular exam is irregular Abdominal exam nontender or distended. No masses palpated. Extremities show no edema. neuro grossly intact    Lab Results: Basic Metabolic Panel:  Recent Labs  07/19/15 1731 07/20/15 0114 07/21/15 0444  NA 139  --  137  K 4.0  --  3.9  CL 105  --  102  CO2 24  --  25  GLUCOSE 118*  --  139*  BUN 12  --  16  CREATININE 0.88  --  0.97  CALCIUM 8.6*  --  9.0  MG  --  1.8 1.9  PHOS  --  3.4  --    CBC:  Recent Labs  07/19/15 1731 07/20/15 0114 07/21/15 0444  WBC 6.7 8.6 8.6  NEUTROABS 4.4 5.4  --   HGB 11.9* 11.4* 11.9*  HCT 36.8* 35.4* 37.4*  MCV 78.5 78.5 78.6  PLT 108* 187 189   Cardiac Enzymes:  Recent Labs  07/20/15 0114 07/20/15 1640  TROPONINI 0.03 0.03     Assessment/Plan:  1 unstable angina-the patient presents with chest pain that is concerning. He also had a recent nuclear study that was abnormal. Apixaban on hold. Continue IV heparin. Proceed with cardiac catheterization. The risks and benefits were discussed and he agrees to proceed. Continue ASA. Continue Lipitor. 2 permanent atrial fibrillation-the patient is in atrial fibrillation and rate controlled.  Change cardizem to 120 mg daily. Continue digoxin. Hold apixaban as outlined above. We will resume once all procedures are complete. 3 hypertension-continue present meds. 4 hyperlipidemia-continue statin. 5 diabetes mellitus-management per primary care. Hold Glucophage now and for 48 hours following catheterization.  Kirk Ruths 07/21/2015, 10:35 AM

## 2015-07-21 NOTE — Progress Notes (Signed)
[facility] Progress Note    Patient: Shaun [name]     [id number]  DOB: 07-Nov-1955     DOA: 07/19/2015 Date of Service: the patient was seen and examined on 07/21/2015 Day 2 of admission.  Subjective: No compressive chest and abdominal pain. No shortness of breath and no dizziness or lightheadedness. No active bleeding on heparin Nutrition: Able to tolerate oral diet Activity: Ambulating fairly Last BM: 07/20/2015  Assessment and Plan: 1. Unstable angina [facility]) Patient presented with complaint of chest pain. Cartilages consulted and the patient was felt to be high risk and had an abnormal nuclear stress test. Patient underwent cardiac catheterization today. The results are still pending. We will follow cartilage recommendation.  2. Atrial fibrillation. Patient is on Apixaban which has been currently on hold for the procedure. Continuing heparin for therapeutic anticoagulation. Continue Cardizem and digoxin.  3  Obstructive sleep apnea Continuing C Pap.  4  Hypertension Continue home medication.  5  Diabetes mellitus ([place]) On metformin at home which is on hold. Patient will be continued on sliding scale insulin.  DVT Prophylaxis: on therapeutic anticoagulation. Nutrition: Nothing by mouth after midnight Advance goals of care discussion: Full code  Brief Summary of Hospitalization:  HPI: From the H&P "Shaun [name] is a 59 y.o. male with a past medical history of atrial fibrillation, hypertension, diastolic CHF, mild CAD on the cardiac cath done in 2000 treated medically, hyperlipidemia, type 2 diabetes, sleep apnea, COPD, restless leg syndrome who comes to the emergency department due to chest pain that woke him up from sleep around 12:30 PM described as a pressure, radiates to his back neck and left shoulder, associated with dyspnea, palpitations, mild dizziness and nausea. He denies diaphoresis, orthopnea or pitting edema all the lower extremities. Earlier  this month the patient had a echocardiogram with an ejection fraction of 60-65% and indeterminate diastolic dysfunction. Nuclear study done earlier showed an ejection fraction of 60% with a small partially reversible defect in the RCA distribution.   Patient is being admitted to rule out ischemia and perform cardiac catheterization on Tuesday" Daily update: Underwent cardiac catheterization on 07/21/2015 Consultants: Cardiology Procedures: Cardiac catheterization Antibiotics: None  Family Communication: family was present at bedside, opportunity was given to ask question and all questions were answered satisfactorily at the time of interview.  Disposition:  Expected discharge date: 07/22/2015 Barriers to safe discharge: Cardiac catheterization results   Intake/Output Summary (Last 24 hours) at 07/21/15 1902 Last data filed at 07/20/15 2324  Gross per 24 hour  Intake    240 ml  Output      0 ml  Net    240 ml   Filed Weights   07/19/15 2134 07/20/15 0517 07/21/15 0507  Weight: 99.882 kg (220 lb 3.2 oz) 99.565 kg (219 lb 8 oz) 98.294 kg (216 lb 11.2 oz)    Objective: Physical Exam: Filed Vitals:   07/21/15 1752 07/21/15 1757 07/21/15 1802 07/21/15 1817  BP:    117/84  Pulse: 0 0 0 83  Temp:    97.7 F (36.5 C)  TempSrc:    Oral  Resp: 7 33 34 20  Height:      Weight:      SpO2: 0% 0% 0% 20%     General: Appear in mild distress, no Rash; Oral Mucosa moist. Cardiovascular: S1 and S2 Present, no Murmur, no JVD Respiratory: Bilateral Air entry present and Clear to Auscultation, no Crackles, no wheezes Abdomen: Bowel Sound present, Soft and  no tenderness Extremities: no Pedal edema, no calf tenderness Neurology: Grossly no focal neuro deficit.  Data Reviewed: CBC:  Recent Labs Lab 07/19/15 1731 07/20/15 0114 07/21/15 0444  WBC 6.7 8.6 8.6  NEUTROABS 4.4 5.4  --   HGB 11.9* 11.4* 11.9*  HCT 36.8* 35.4* 37.4*  MCV 78.5 78.5 78.6  PLT 108* 187 [id number]   Basic  Metabolic Panel:  Recent Labs Lab 07/19/15 1731 07/20/15 0114 07/21/15 0444  NA 139  --  137  K 4.0  --  3.9  CL 105  --  102  CO2 24  --  25  GLUCOSE 118*  --  139*  BUN 12  --  16  CREATININE 0.88  --  0.97  CALCIUM 8.6*  --  9.0  MG  --  1.8 1.9  PHOS  --  3.4  --    Liver Function Tests: No results for input(s): AST, ALT, ALKPHOS, BILITOT, PROT, ALBUMIN in the last 168 hours. No results for input(s): LIPASE, AMYLASE in the last 168 hours. No results for input(s): AMMONIA in the last 168 hours.  Cardiac Enzymes:  Recent Labs Lab 07/20/15 0114 07/20/15 1640  TROPONINI 0.03 0.03   BNP (last 3 results)  Recent Labs  10/09/14 2159  BNP 52.0    ProBNP (last 3 results) No results for input(s): PROBNP in the last 8760 hours.   CBG:  Recent Labs Lab 07/20/15 1636 07/20/15 2246 07/21/15 0729 07/21/15 1143 07/21/15 1857  GLUCAP 112* 137* 132* 111* 94    No results found for this or any previous visit (from the past 240 hour(s)).   Studies: No results found.   Scheduled Meds: . antiseptic oral rinse  7 mL Mouth Rinse BID  . [START ON 07/22/2015] aspirin  81 mg Oral Daily  . atorvastatin  40 mg Oral q1800  . chlorthalidone  25 mg Oral Daily  . [START ON 07/22/2015] clopidogrel  75 mg Oral Q breakfast  . digoxin  250 mcg Oral q morning - 10a  . diltiazem  120 mg Oral Daily  . insulin aspart  0-5 Units Subcutaneous QHS  . insulin aspart  0-9 Units Subcutaneous TID WC  . lisinopril  5 mg Oral Daily  . pantoprazole  40 mg Oral Daily  . rOPINIRole  2 mg Oral QHS  . sodium chloride  3 mL Intravenous Q12H  . sodium chloride  3 mL Intravenous Q12H  . sodium chloride  3 mL Intravenous Q12H  . traZODone  100 mg Oral QHS   Continuous Infusions: . sodium chloride      Time spent: 30 minutes  Author: Berle Mull, MD [facility] Pager: [phone] 07/21/2015 7:02 PM  If 7PM-7AM, please contact night-coverage at [email or web address], password [facility]

## 2015-07-22 ENCOUNTER — Encounter (HOSPITAL_COMMUNITY): Payer: Self-pay | Admitting: Cardiology

## 2015-07-22 DIAGNOSIS — J439 Emphysema, unspecified: Secondary | ICD-10-CM

## 2015-07-22 LAB — BASIC METABOLIC PANEL
ANION GAP: 9 (ref 5–15)
BUN: 14 mg/dL (ref 6–20)
CALCIUM: 8.5 mg/dL — AB (ref 8.9–10.3)
CO2: 24 mmol/L (ref 22–32)
Chloride: 104 mmol/L (ref 101–111)
Creatinine, Ser: 0.9 mg/dL (ref 0.61–1.24)
GFR calc Af Amer: >60 mL/min (ref >60)
GFR calc non Af Amer: >60 mL/min (ref >60)
GLUCOSE: 118 mg/dL — AB (ref 65–99)
POTASSIUM: 4.3 mmol/L (ref 3.5–5.1)
Sodium: 137 mmol/L (ref 135–145)

## 2015-07-22 LAB — GLUCOSE, CAPILLARY: Glucose-Capillary: 112 mg/dL — ABNORMAL HIGH (ref 65–99)

## 2015-07-22 LAB — CBC
HCT: 35.4 % — ABNORMAL LOW (ref 39.0–52.0)
Hemoglobin: 11.5 g/dL — ABNORMAL LOW (ref 13.0–17.0)
MCH: 25.5 pg — AB (ref 26.0–34.0)
MCHC: 32.5 g/dL (ref 30.0–36.0)
MCV: 78.5 fL (ref 78.0–100.0)
PLATELETS: 193 10*3/uL (ref 150–400)
RBC: 4.51 MIL/uL (ref 4.22–5.81)
RDW: 14.7 % (ref 11.5–15.5)
WBC: 8.9 10*3/uL (ref 4.0–10.5)

## 2015-07-22 MED ORDER — APIXABAN 5 MG PO TABS
5.0000 mg | ORAL_TABLET | Freq: Two times a day (BID) | ORAL | Status: DC
  Administered 2015-07-22: 09:00:00 5 mg via ORAL
  Filled 2015-07-22: qty 1

## 2015-07-22 MED ORDER — CLOPIDOGREL BISULFATE 75 MG PO TABS: 75.0000 mg | ORAL_TABLET | Freq: Every day | ORAL | Status: DC

## 2015-07-22 MED ORDER — ASPIRIN 81 MG PO CHEW: 81.0000 mg | CHEWABLE_TABLET | Freq: Every day | ORAL | Status: DC

## 2015-07-22 MED ORDER — DILTIAZEM HCL ER COATED BEADS 120 MG PO CP24: 120.0000 mg | ORAL_CAPSULE | Freq: Every day | ORAL | Status: DC

## 2015-07-22 MED ORDER — ATORVASTATIN CALCIUM 80 MG PO TABS: 80.0000 mg | ORAL_TABLET | Freq: Every day | ORAL | Status: DC

## 2015-07-22 MED ORDER — PANTOPRAZOLE SODIUM 40 MG PO TBEC: 40.0000 mg | DELAYED_RELEASE_TABLET | Freq: Every day | ORAL | Status: DC

## 2015-07-22 MED ORDER — METFORMIN HCL 500 MG PO TABS: 1000.0000 mg | ORAL_TABLET | Freq: Two times a day (BID) | ORAL | Status: DC

## 2015-07-22 NOTE — Progress Notes (Signed)
CARDIAC REHAB PHASE I   PRE:  Rate/Rhythm: 85 a. fib  BP:  Sitting: 114/71        SaO2: 97 RA  MODE:  Ambulation: 1000 ft   POST:  Rate/Rhythm: 96 a.fib  BP:  Sitting: 149/82         SaO2: 98 RA  Pt ambulated 1000 ft on RA, independent, steady gait, tolerated well.  Pt denies cp, dizziness, DOE, declined rest stop, states "I feel really good." Completed PCI/stent education.  Reviewed risk factors, anti-platelet therapy, stent card, activity restrictions, ntg, exercise, heart healthy diet, carb counting, portion control, and phase 2 cardiac rehab. Pt verbalized understanding, receptive to education. Pt agrees to phase 2 cardiac rehab. Will send referral to [facility].  Pt to recliner after walk, call bell within reach.   PR:[phone]  Lenna Sciara, RN, BSN 07/22/2015 9:31 AM

## 2015-07-22 NOTE — Discharge Summary (Signed)
Physician Discharge Summary   Patient ID: Shaun [name] MRN: [id number] DOB/AGE: [birth year] 59 y.o.  Admit date: 07/19/2015 Discharge date: 07/22/2015  Primary Care Physician:  Mickie Hillier, MD  Discharge Diagnoses:     . Unstable angina (HCC) status post PCI of left circumflex  . GERD  . Atrial fibrillation ([place]) . Hypertension . Obstructive sleep apnea . COPD (chronic obstructive pulmonary disease) ([place])  Consults:  Cardiology, Dr. Stanford Breed  Recommendations for Outpatient Follow-up:  1. Patient started on aspirin and Plavix for one month then continue Plavix daily 2. Please repeat CBC/BMET at next visit   TESTS THAT NEED FOLLOW-UP  CBC, BMET   DIET: Heart healthy diet    Allergies:  No Known Allergies   DISCHARGE MEDICATIONS: Current Discharge Medication List    START taking these medications   Details  aspirin 81 MG chewable tablet Chew 1 tablet (81 mg total) by mouth daily. For 1 month with Plavix. Qty: 30 tablet, Refills: 0    clopidogrel (PLAVIX) 75 MG tablet Take 1 tablet (75 mg total) by mouth daily with breakfast. Qty: 90 tablet, Refills: 3    diltiazem (CARDIZEM CD) 120 MG 24 hr capsule Take 1 capsule (120 mg total) by mouth daily. Qty: 30 capsule, Refills: 3    pantoprazole (PROTONIX) 40 MG tablet Take 1 tablet (40 mg total) by mouth daily. Qty: 30 tablet, Refills: 3      CONTINUE these medications which have CHANGED   Details  metFORMIN (GLUCOPHAGE) 500 MG tablet Take 2 tablets (1,000 mg total) by mouth 2 (two) times daily with a meal. Qty: 120 tablet, Refills: 0      CONTINUE these medications which have NOT CHANGED   Details  albuterol-ipratropium (COMBIVENT) 18-103 MCG/ACT inhaler Inhale 2 puffs into the lungs every 6 (six) hours as needed. Qty: 14.7 g, Refills: 5    atorvastatin (LIPITOR) 40 MG tablet Take 1 tablet (40 mg total) by mouth daily. Qty: 30 tablet, Refills: 11    chlorthalidone (HYGROTON) 25 MG tablet TAKE 1  TABLET BY MOUTH AS NEEDED FOR FLUID RETENTION Qty: 30 tablet, Refills: 3    DIGOX 250 MCG tablet TAKE 1 TABLET BY MOUTH EVERY MORNING Qty: 30 tablet, Refills: 6    ELIQUIS 5 MG TABS tablet TAKE 1 TABLET BY MOUTH TWICE DAILY Qty: 60 tablet, Refills: 3    HYDROcodone-acetaminophen (NORCO/VICODIN) 5-325 MG tablet Take 0.5-1 tablets by mouth daily as needed. Qty: 30 tablet, Refills: 0    lisinopril (PRINIVIL,ZESTRIL) 5 MG tablet TAKE 1 TABLET BY MOUTH DAILY Qty: 90 tablet, Refills: 3    Multiple Vitamins-Minerals (MULTIVITAMIN WITH MINERALS) tablet Take 1 tablet by mouth daily.      nitroGLYCERIN (NITROSTAT) 0.4 MG SL tablet Place 1 tablet (0.4 mg total) under the tongue every 5 (five) minutes as needed. Qty: 25 tablet, Refills: 3    Omega-3 Fatty Acids (FISH OIL PO) Take 1 tablet by mouth daily.    rOPINIRole (REQUIP) 2 MG tablet TAKE 1 TABLET BY MOUTH EVERY NIGHT AT BEDTIME Qty: 30 tablet, Refills: 5    sodium chloride (OCEAN) 0.65 % SOLN nasal spray Place 1 spray into both nostrils as needed for congestion.    traZODone (DESYREL) 50 MG tablet Take 2 tablets (100 mg total) by mouth at bedtime. Qty: 60 tablet, Refills: 5    triamcinolone cream (KENALOG) 0.1 % Apply 1 application topically 2 (two) times daily. Qty: 60 g, Refills: 1      STOP taking these medications  omeprazole (PRILOSEC) 20 MG capsule      VIAGRA 100 MG tablet      TAZTIA XT 360 MG 24 hr capsule          Brief H and P: For complete details please refer to admission H and P, but in brief patient is a 59 year old male with a past medical history of atrial fibrillation, hypertension, diastolic CHF, mild CAD on the cardiac cath done in 2000 treated medically, hyperlipidemia, type 2 diabetes, sleep apnea, COPD, restless leg syndrome presented to ED with chest pain that woke him up from sleep around 12:30 PM. He described as a pressure, radiates to his back neck and left shoulder, associated with dyspnea,  palpitations, mild dizziness and nausea. He denied diaphoresis, orthopnea or pitting edema all the lower extremities. Earlier this month the patient had a echocardiogram with an ejection fraction of 60-65% and indeterminate diastolic dysfunction. Nuclear study done earlier showed an ejection fraction of 60% with a small partially reversible defect in the RCA distribution. Patient was admitted as chest pain rule out.   Hospital Course:   1. Unstable angina [facility]) Patient presented with complaint of chest pain. Cardiology was consulted, patient was felt to be high risk, had an abnormal nuclear stress test. Patient underwent cardiac catheterization which showed mid circumflex lesion 95%, status post PCI of left circumflex. Per cardiology recommendations, placed on aspirin and Plavix dual antiplatelet therapy for one month and continue Plavix only. Patient will have follow-up appointment with Dr. Harl Bowie in 2 weeks. Outpatient cardiac rehabilitation referral was sent.  2. Atrial fibrillation. Per cardiology, continue Cardizem and digoxin, resume Apixaban   3 Obstructive sleep apnea Continuing CPAP.  4 Hypertension Continue home medication.  5 Diabetes mellitus ([place]) On metformin at home which is on hold due to contrast for the cardiac cath, patient will resume in 48 hours. He was placed on sliding scale insulin while inpatient.  6. GERD Omeprazole discontinued (started on Plavix this admission), placed on Protonix    Day of Discharge BP 149/82 mmHg  Pulse 80  Temp(Src) 98 F (36.7 C) (Oral)  Resp 21  Ht 5\' 8"  (1.727 m)  Wt 100 kg (220 lb 7.4 oz)  BMI 33.53 kg/m2  SpO2 98%  Physical Exam: General: Alert and awake oriented x3 not in any acute distress. HEENT: anicteric sclera, pupils reactive to light and accommodation CVS: S1-S2 clear no murmur rubs or gallops Chest: clear to auscultation bilaterally, no wheezing rales or rhonchi Abdomen: soft nontender, nondistended, normal  bowel sounds Extremities: no cyanosis, clubbing or edema noted bilaterally Neuro: Cranial nerves II-XII intact, no focal neurological deficits   The results of significant diagnostics from this hospitalization (including imaging, microbiology, ancillary and laboratory) are listed below for reference.    LAB RESULTS: Basic Metabolic Panel:  Recent Labs Lab 07/20/15 0114 07/21/15 0444 07/22/15 0451  NA  --  137 137  K  --  3.9 4.3  CL  --  102 104  CO2  --  25 24  GLUCOSE  --  139* 118*  BUN  --  16 14  CREATININE  --  0.97 0.90  CALCIUM  --  9.0 8.5*  MG 1.8 1.9  --   PHOS 3.4  --   --    Liver Function Tests: No results for input(s): AST, ALT, ALKPHOS, BILITOT, PROT, ALBUMIN in the last 168 hours. No results for input(s): LIPASE, AMYLASE in the last 168 hours. No results for input(s): AMMONIA in the last  168 hours. CBC:  Recent Labs Lab 07/20/15 0114 07/21/15 0444 07/22/15 0451  WBC 8.6 8.6 8.9  NEUTROABS 5.4  --   --   HGB 11.4* 11.9* 11.5*  HCT 35.4* 37.4* 35.4*  MCV 78.5 78.6 78.5  PLT 187 189 193   Cardiac Enzymes:  Recent Labs Lab 07/20/15 0114 07/20/15 1640  TROPONINI 0.03 0.03   BNP: Invalid input(s): POCBNP CBG:  Recent Labs Lab 07/21/15 2128 07/22/15 0637  GLUCAP 131* 112*    Significant Diagnostic Studies:  Dg Chest 2 View  07/19/2015  CLINICAL DATA:  Left-sided chest pain with onset earlier today. Initial encounter. EXAM: CHEST  2 VIEW COMPARISON:  10/09/2014 and 08/30/2013 radiographs. FINDINGS: The heart size and mediastinal contours are stable. There is stable mild asymmetric elevation of the right hemidiaphragm. The lungs are clear. There is no pleural effusion or pneumothorax. No acute osseous findings are demonstrated. There is evidence of previous distal right clavicle resection. IMPRESSION: Stable chest.  No active cardiopulmonary process. Electronically Signed   By: Richardean Sale M.D.   On: 07/19/2015 17:45    2D  ECHO:   Disposition and Follow-up: Discharge Instructions    AMB Referral to Cardiac Rehabilitation - Phase II    Complete by:  As directed   Diagnosis:  PCI     Amb Referral to Cardiac Rehabilitation    Complete by:  As directed   Diagnosis:  PCI     Diet - low sodium heart healthy    Complete by:  As directed      Diet Carb Modified    Complete by:  As directed      Discharge instructions    Complete by:  As directed   Please continue Aspirin and Plavix for 1 month. Then discontinue Aspirin and stay on plavix indefinitely or as instructed by your cardiologist.     Increase activity slowly    Complete by:  As directed             DISPOSITION: Home   DISCHARGE FOLLOW-UP Follow-up Information    Follow up with Carlyle Dolly, MD. Schedule an appointment as soon as possible for a visit in 2 weeks.   Specialty:  Cardiology   Why:  for hospital follow-up. Office will call you with appt.    Contact information:   [address] [place] [address] [phone]       Follow up with Mickie Hillier, MD. Schedule an appointment as soon as possible for a visit in 2 weeks.   Specialty:  Family Medicine   Why:  for hospital follow-up   Contact information:   [place] [address] [phone]        Time spent on Discharge: 35 minutes  Signed:   Blaike Newburn M.D. [facility] 07/22/2015, 10:33 AM Pager: [phone]

## 2015-07-22 NOTE — Progress Notes (Signed)
    Subjective:  Denies CP or dyspnea   Objective:  Filed Vitals:   07/21/15 2000 07/21/15 2111 07/22/15 0347 07/22/15 0747  BP:  130/81 123/72 114/71  Pulse: 86 76 87 80  Temp:  98.7 F (37.1 C) 97.6 F (36.4 C) 98 F (36.7 C)  TempSrc:  Oral Oral Oral  Resp: 19 17 20 21   Height:      Weight:   100 kg (220 lb 7.4 oz)   SpO2: 96% 98% 93% 98%    Intake/Output from previous day:  Intake/Output Summary (Last 24 hours) at 07/22/15 0810 Last data filed at 07/22/15 0749  Gross per 24 hour  Intake    240 ml  Output   1400 ml  Net  -1160 ml    Physical Exam: Physical exam: Well-developed well-nourished in no acute distress.  Skin is warm and dry.  HEENT is normal.  Neck is supple.  Chest is clear to auscultation with normal expansion.  Cardiovascular exam is irregular Abdominal exam nontender or distended. No masses palpated. Extremities show no edema. Radial cath site with no hematoma neuro grossly intact    Lab Results: Basic Metabolic Panel:  Recent Labs  07/20/15 0114 07/21/15 0444 07/22/15 0451  NA  --  137 137  K  --  3.9 4.3  CL  --  102 104  CO2  --  25 24  GLUCOSE  --  139* 118*  BUN  --  16 14  CREATININE  --  0.97 0.90  CALCIUM  --  9.0 8.5*  MG 1.8 1.9  --   PHOS 3.4  --   --    CBC:  Recent Labs  07/19/15 1731 07/20/15 0114 07/21/15 0444 07/22/15 0451  WBC 6.7 8.6 8.6 8.9  NEUTROABS 4.4 5.4  --   --   HGB 11.9* 11.4* 11.9* 11.5*  HCT 36.8* 35.4* 37.4* 35.4*  MCV 78.5 78.5 78.6 78.5  PLT 108* 187 189 193   Cardiac Enzymes:  Recent Labs  07/20/15 0114 07/20/15 1640  TROPONINI 0.03 0.03     Assessment/Plan:  1 unstable angina-cath results noted; s/p PCI of Lcx; Continue ASA 81 mg daily, plavix 75 mg daily for one month then DC ASA and continue plavix. Continue Lipitor. 2 permanent atrial fibrillation-the patient is in atrial fibrillation and rate controlled. Continue cardizem. Continue digoxin. Resume apixaban. 3  hypertension-continue present meds. 4 hyperlipidemia-continue lipitor but increase to 80 mg daily; check lipids and liver in 4 weeks. 5 diabetes mellitus-management per primary care. Hold Glucophage for 48 hours following catheterization. Patient can be DCed from a cardiac standpoint and fu with Dr Harl Bowie in [place] in 2 weeks Gwendolyn Fill 07/22/2015, 8:10 AM

## 2015-07-24 ENCOUNTER — Other Ambulatory Visit: Payer: Self-pay | Admitting: Adult Health

## 2015-07-27 ENCOUNTER — Other Ambulatory Visit: Payer: Self-pay | Admitting: Family Medicine

## 2015-07-30 ENCOUNTER — Telehealth: Payer: Self-pay | Admitting: Family Medicine

## 2015-07-30 ENCOUNTER — Encounter: Payer: Self-pay | Admitting: Adult Health

## 2015-07-30 ENCOUNTER — Other Ambulatory Visit: Payer: Self-pay | Admitting: *Deleted

## 2015-07-30 ENCOUNTER — Ambulatory Visit (INDEPENDENT_AMBULATORY_CARE_PROVIDER_SITE_OTHER): Payer: 59 | Admitting: Adult Health

## 2015-07-30 VITALS — BP 142/78 | HR 76 | Ht 68.0 in | Wt 217.8 lb

## 2015-07-30 DIAGNOSIS — I2510 Atherosclerotic heart disease of native coronary artery without angina pectoris: Secondary | ICD-10-CM | POA: Diagnosis not present

## 2015-07-30 DIAGNOSIS — E7800 Pure hypercholesterolemia, unspecified: Secondary | ICD-10-CM | POA: Diagnosis not present

## 2015-07-30 DIAGNOSIS — I481 Persistent atrial fibrillation: Secondary | ICD-10-CM

## 2015-07-30 DIAGNOSIS — I4819 Other persistent atrial fibrillation: Secondary | ICD-10-CM

## 2015-07-30 MED ORDER — HYDROCODONE-ACETAMINOPHEN 5-325 MG PO TABS: 0.5000 | ORAL_TABLET | Freq: Every day | ORAL | Status: DC | PRN

## 2015-07-30 MED ORDER — CLOPIDOGREL BISULFATE 75 MG PO TABS: 75.0000 mg | ORAL_TABLET | Freq: Every day | ORAL | Status: DC

## 2015-07-30 NOTE — Progress Notes (Signed)
Cardiology Office Note   Date:  07/30/2015   ID:  MACK CZAJA, DOB October 14, 1955, MRN [id number]  PCP:  Mickie Hillier, MD  Cardiologist: Cloria Spring, NP   Chief Complaint  Patient presents with  . Coronary Artery Disease  . Atrial Fibrillation      History of Present Illness: Shaun [name] is a 59 y.o. male who presents for ongoing assessment and management of atrial fibrillation, hypertension, diastolic CHF, mild CAD on the cardiac cath done in 2000 treated medically, hyperlipidemia, with recent admission for unstable angina. He underwent cardiac cath 07/19/2015, which demonstrated 95% mid circumflex stenosis, with subsequent PCI. He was continued on apixaban for atrial fib, and placed on DAPT with plavix for one month and then discontinue ASA but continue plavix. He was discharged on 07/22/2015.   He comes today tired but actually feeling better. He is without complaints of chest pain or DOE. He has not yet been started in cardiac rehab. He is medically complaint. He has questions about his cardiac cath and they are answered. I have printed him a copy of his cath report.     Past Medical History  Diagnosis Date  . Paroxysmal atrial fibrillation ([place]) 10/2004    Onset in 10/2004; recurred 09/2008  . Arteriosclerotic cardiovascular disease (ASCVD)     Nonobstructive; cath in 2000: 30-40% mid LAD and proximal RCA;normal EF. stress nuclear in 2006-subtle inferoseptal hypoperfusion with reversibility; negative stress EKG; good exercise tolerance  . Hypertension   . Sinusitis   . Insomnia   . Obstructive sleep apnea 12/2009    01/26/2010 AHI 83/hr  . Diabetes mellitus without complication ([place])   . PUD (peptic ulcer disease)     1980s  . Hyperlipidemia   . RLS (restless legs syndrome)   . Asthma   . CHF (congestive heart failure) ([place])   . Colon polyps     30 colon polyps found on first colonoscopy    Past Surgical History  Procedure Laterality Date  .  Hernia repair  1986    Left inguinal  . Rotator cuff repair      Right  . Colonoscopy N/A 05/19/2014    Dr. Barnie Alderman diverticulosis/moderate external hemorrhoids, >20 simple adenomas. Genetic screening negative.   . Cardiac catheterization N/A 07/21/2015    Procedure: Left Heart Cath and Coronary Angiography;  Surgeon: Peter M Martinique, MD;  Location: [place] [facility];  Service: Cardiovascular;  Laterality: N/A;  . Cardiac catheterization N/A 07/21/2015    Procedure: Coronary Stent Intervention;  Surgeon: Peter M Martinique, MD;  Location: [place];  Service: Cardiovascular;  Laterality: N/A;     Current Outpatient Prescriptions  Medication Sig Dispense Refill  . albuterol-ipratropium (COMBIVENT) 18-103 MCG/ACT inhaler Inhale 2 puffs into the lungs every 6 (six) hours as needed. (Patient taking differently: Inhale 2 puffs into the lungs every 6 (six) hours as needed for wheezing or shortness of breath. ) 14.7 g 5  . atorvastatin (LIPITOR) 40 MG tablet Take 1 tablet (40 mg total) by mouth daily. 30 tablet 11  . chlorthalidone (HYGROTON) 25 MG tablet TAKE 1 TABLET BY MOUTH AS NEEDED FOR FLUID RETENTION 30 tablet 3  . clopidogrel (PLAVIX) 75 MG tablet Take 1 tablet (75 mg total) by mouth daily with breakfast. 90 tablet 3  . DIGOX 250 MCG tablet TAKE 1 TABLET BY MOUTH EVERY MORNING 30 tablet 6  . diltiazem (CARDIZEM CD) 120 MG 24 hr capsule Take 1 capsule (120 mg total)  by mouth daily. 30 capsule 3  . ELIQUIS 5 MG TABS tablet TAKE 1 TABLET BY MOUTH TWICE DAILY 60 tablet 3  . HYDROcodone-acetaminophen (NORCO/VICODIN) 5-325 MG tablet Take 0.5-1 tablets by mouth daily as needed. 30 tablet 0  . lisinopril (PRINIVIL,ZESTRIL) 5 MG tablet TAKE 1 TABLET BY MOUTH DAILY 90 tablet 3  . metFORMIN (GLUCOPHAGE) 500 MG tablet Take 2 tablets (1,000 mg total) by mouth 2 (two) times daily with a meal. 120 tablet 0  . Multiple Vitamins-Minerals (MULTIVITAMIN WITH MINERALS) tablet Take 1 tablet by mouth  daily.      . nitroGLYCERIN (NITROSTAT) 0.4 MG SL tablet Place 1 tablet (0.4 mg total) under the tongue every 5 (five) minutes as needed. (Patient taking differently: Place 0.4 mg under the tongue every 5 (five) minutes as needed for chest pain. ) 25 tablet 3  . Omega-3 Fatty Acids (FISH OIL PO) Take 1 tablet by mouth daily.    . pantoprazole (PROTONIX) 40 MG tablet Take 1 tablet (40 mg total) by mouth daily. 30 tablet 3  . rOPINIRole (REQUIP) 2 MG tablet TAKE 1 TABLET BY MOUTH EVERY NIGHT AT BEDTIME 30 tablet 5  . sodium chloride (OCEAN) 0.65 % SOLN nasal spray Place 1 spray into both nostrils as needed for congestion.    . TAZTIA XT 360 MG 24 hr capsule TAKE 1 CAPSULE BY MOUTH EVERY DAY 30 capsule 3  . traZODone (DESYREL) 50 MG tablet TAKE 1 TABLET BY MOUTH EVERY NIGHT AT BEDTIME FOR 7 DAYS THEN TAKE 2 TABLETS BY MOUTH EVERY NIGHT AT BEDTIME 60 tablet 0  . triamcinolone cream (KENALOG) 0.1 % Apply 1 application topically 2 (two) times daily. 60 g 1   No current facility-administered medications for this visit.    Allergies:   Review of patient's allergies indicates no known allergies.    Social History:  The patient  reports that he quit smoking about 2 years ago. His smoking use included Cigarettes. He started smoking about 45 years ago. He has a 32 pack-year smoking history. He has never used smokeless tobacco. He reports that he does not drink alcohol or use illicit drugs.   Family History:  The patient's family history includes Brain cancer in his maternal uncle; Breast cancer in his cousin; Breast cancer (age of onset: 41) in his mother; Cancer in his maternal uncle; Diabetes in his brother; Heart attack in his father; Hypertension in his mother; Skin cancer (age of onset: 20) in his sister. There is no history of Colon cancer.    ROS: All other systems are reviewed and negative. Unless otherwise mentioned in H&P    PHYSICAL EXAM: VS:  BP 142/78 mmHg  Pulse 76  Ht 5\' 8"  (1.727 m)   Wt 217 lb 12.8 oz (98.793 kg)  BMI 33.12 kg/m2  SpO2 97% , BMI Body mass index is 33.12 kg/(m^2). GEN: Well nourished, well developed, in no acute distress HEENT: normal Neck: no JVD, carotid bruits, or masses Cardiac: IRRR; no murmurs, rubs, or gallops,no edema  Respiratory:  clear to auscultation bilaterally, normal work of breathing GI: soft, nontender, nondistended, + BS MS: no deformity or atrophy Skin: warm and dry, no rash Neuro:  Strength and sensation are intact Psych: euthymic mood, full affect   Recent Labs: 10/09/2014: B Natriuretic Peptide 52.0 06/19/2015: ALT 28 07/21/2015: Magnesium 1.9 07/22/2015: BUN 14; Creatinine, Ser 0.90; Hemoglobin 11.5*; Platelets 193; Potassium 4.3; Sodium 137    Lipid Panel    Component Value Date/Time  CHOL 163 06/19/2015 0810   CHOL 125 03/12/2014 0704   TRIG 186* 06/19/2015 0810   HDL 27* 06/19/2015 0810   HDL 29* 03/12/2014 0704   CHOLHDL 6.0* 06/19/2015 0810   CHOLHDL 4.3 03/12/2014 0704   VLDL 38 03/12/2014 0704   LDLCALC 99 06/19/2015 0810   LDLCALC 58 03/12/2014 0704      Wt Readings from Last 3 Encounters:  07/30/15 217 lb 12.8 oz (98.793 kg)  07/22/15 220 lb 7.4 oz (100 kg)  06/30/15 224 lb (101.606 kg)     ASSESSMENT AND PLAN:  1. CAD: S/P angioplasty of the mid Cx for in-stenosis. He will continue on DAPT with ASA, Plavix for another 2 weeks and then stop the ASA. He is referred to cardiac rehab. He is to be seen again in 3 months. Questions are answered and copy of cath report is provided.   2. Atrial fib: Heart rate is controlled. He is to stop the TAZTIA XT on continue the diltiazem along with Eliquis and Digoxin. He is to stop ASA in 2 weeks. CHADS VASC Score of 3.   3. Hypercholesterolemia: Continue statin therapy    Current medicines are reviewed at length with the patient today.    Labs/ tests ordered today include: none No orders of the defined types were placed in this encounter.      Disposition:   FU with 3 months.  Olena Heckle, NP  07/30/2015 2:50 PM    [facility] [address], [place],  [address] Phone: [phone]; Fax: [phone]

## 2015-07-30 NOTE — Telephone Encounter (Signed)
May have 30 tablets follow-up on a regular basis

## 2015-07-30 NOTE — Telephone Encounter (Signed)
Pt is needing a refill on his hydrocodone.    

## 2015-07-30 NOTE — Progress Notes (Deleted)
Name: Shaun [name]    DOB: 1955-11-21  Age: 59 y.o.  MR#: [id number]       PCP:  Mickie Hillier, MD      Insurance: Payor: Onnie Boer / Plan: [employer] OTHER / Product Type: *No Product type* /   CC:   No chief complaint on file.   VS Filed Vitals:   07/30/15 1402  BP: 142/78  Pulse: 76  Height: 5\' 8"  (1.727 m)  Weight: 217 lb 12.8 oz (98.793 kg)  SpO2: 97%    Weights Current Weight  07/30/15 217 lb 12.8 oz (98.793 kg)  07/22/15 220 lb 7.4 oz (100 kg)  06/30/15 224 lb (101.606 kg)    Blood Pressure  BP Readings from Last 3 Encounters:  07/30/15 142/78  07/22/15 149/82  06/30/15 158/80     Admit date:  (Not on file) Last encounter with RMR:  07/24/2015   Allergy Review of patient's allergies indicates no known allergies.  Current Outpatient Prescriptions  Medication Sig Dispense Refill  . albuterol-ipratropium (COMBIVENT) 18-103 MCG/ACT inhaler Inhale 2 puffs into the lungs every 6 (six) hours as needed. (Patient taking differently: Inhale 2 puffs into the lungs every 6 (six) hours as needed for wheezing or shortness of breath. ) 14.7 g 5  . aspirin 81 MG chewable tablet Chew 1 tablet (81 mg total) by mouth daily. For 1 month with Plavix. 30 tablet 0  . atorvastatin (LIPITOR) 40 MG tablet Take 1 tablet (40 mg total) by mouth daily. 30 tablet 11  . chlorthalidone (HYGROTON) 25 MG tablet TAKE 1 TABLET BY MOUTH AS NEEDED FOR FLUID RETENTION 30 tablet 3  . clopidogrel (PLAVIX) 75 MG tablet Take 1 tablet (75 mg total) by mouth daily with breakfast. 90 tablet 3  . DIGOX 250 MCG tablet TAKE 1 TABLET BY MOUTH EVERY MORNING 30 tablet 6  . diltiazem (CARDIZEM CD) 120 MG 24 hr capsule Take 1 capsule (120 mg total) by mouth daily. 30 capsule 3  . ELIQUIS 5 MG TABS tablet TAKE 1 TABLET BY MOUTH TWICE DAILY 60 tablet 3  . HYDROcodone-acetaminophen (NORCO/VICODIN) 5-325 MG tablet Take 0.5-1 tablets by mouth daily as needed. 30 tablet 0  . lisinopril (PRINIVIL,ZESTRIL)  5 MG tablet TAKE 1 TABLET BY MOUTH DAILY 90 tablet 3  . metFORMIN (GLUCOPHAGE) 500 MG tablet Take 2 tablets (1,000 mg total) by mouth 2 (two) times daily with a meal. 120 tablet 0  . Multiple Vitamins-Minerals (MULTIVITAMIN WITH MINERALS) tablet Take 1 tablet by mouth daily.      . nitroGLYCERIN (NITROSTAT) 0.4 MG SL tablet Place 1 tablet (0.4 mg total) under the tongue every 5 (five) minutes as needed. (Patient taking differently: Place 0.4 mg under the tongue every 5 (five) minutes as needed for chest pain. ) 25 tablet 3  . Omega-3 Fatty Acids (FISH OIL PO) Take 1 tablet by mouth daily.    . pantoprazole (PROTONIX) 40 MG tablet Take 1 tablet (40 mg total) by mouth daily. 30 tablet 3  . rOPINIRole (REQUIP) 2 MG tablet TAKE 1 TABLET BY MOUTH EVERY NIGHT AT BEDTIME 30 tablet 5  . sodium chloride (OCEAN) 0.65 % SOLN nasal spray Place 1 spray into both nostrils as needed for congestion.    . TAZTIA XT 360 MG 24 hr capsule TAKE 1 CAPSULE BY MOUTH EVERY DAY 30 capsule 3  . traZODone (DESYREL) 50 MG tablet TAKE 1 TABLET BY MOUTH EVERY NIGHT AT BEDTIME FOR 7 DAYS THEN TAKE 2 TABLETS BY  MOUTH EVERY NIGHT AT BEDTIME 60 tablet 0  . triamcinolone cream (KENALOG) 0.1 % Apply 1 application topically 2 (two) times daily. 60 g 1   No current facility-administered medications for this visit.    Discontinued Meds:   There are no discontinued medications.  Patient Active Problem List   Diagnosis Date Noted  . COPD (chronic obstructive pulmonary disease) ([place]) 07/20/2015  . Unstable angina ([place]) 07/19/2015  . Acute chest pain 07/19/2015  . Genetic testing 09/09/2014  . History of colonic polyps 08/11/2014  . Colon polyps   . Encounter for screening colonoscopy 04/30/2014  . Chest pain 08/30/2013  . Diabetes mellitus ([place]) 02/09/2011  . Arteriosclerotic cardiovascular disease (ASCVD)   . Tobacco abuse, in remission   . Hypertension   . Hyperlipidemia LDL goal <100 02/23/2010  . Atrial fibrillation ([place])  02/23/2010  . Obstructive sleep apnea 12/27/2009    LABS    Component Value Date/Time   NA 137 07/22/2015 0451   NA 137 07/21/2015 0444   NA 139 07/19/2015 1731   K 4.3 07/22/2015 0451   K 3.9 07/21/2015 0444   K 4.0 07/19/2015 1731   CL 104 07/22/2015 0451   CL 102 07/21/2015 0444   CL 105 07/19/2015 1731   CO2 24 07/22/2015 0451   CO2 25 07/21/2015 0444   CO2 24 07/19/2015 1731   GLUCOSE 118* 07/22/2015 0451   GLUCOSE 139* 07/21/2015 0444   GLUCOSE 118* 07/19/2015 1731   BUN 14 07/22/2015 0451   BUN 16 07/21/2015 0444   BUN 12 07/19/2015 1731   CREATININE 0.90 07/22/2015 0451   CREATININE 0.97 07/21/2015 0444   CREATININE 0.88 07/19/2015 1731   CREATININE 0.81 03/12/2014 0704   CREATININE 0.73 03/16/2013 0835   CALCIUM 8.5* 07/22/2015 0451   CALCIUM 9.0 07/21/2015 0444   CALCIUM 8.6* 07/19/2015 1731   GFRNONAA >60 07/22/2015 0451   GFRNONAA >60 07/21/2015 0444   GFRNONAA >60 07/19/2015 1731   GFRAA >60 07/22/2015 0451   GFRAA >60 07/21/2015 0444   GFRAA >60 07/19/2015 1731   CMP     Component Value Date/Time   NA 137 07/22/2015 0451   K 4.3 07/22/2015 0451   CL 104 07/22/2015 0451   CO2 24 07/22/2015 0451   GLUCOSE 118* 07/22/2015 0451   BUN 14 07/22/2015 0451   CREATININE 0.90 07/22/2015 0451   CREATININE 0.81 03/12/2014 0704   CALCIUM 8.5* 07/22/2015 0451   PROT 6.8 06/19/2015 0810   PROT 6.4 03/12/2014 0704   ALBUMIN 4.5 06/19/2015 0810   ALBUMIN 4.2 03/12/2014 0704   AST 22 06/19/2015 0810   ALT 28 06/19/2015 0810   ALKPHOS 64 06/19/2015 0810   BILITOT 0.3 06/19/2015 0810   BILITOT 0.4 03/12/2014 0704   GFRNONAA >60 07/22/2015 0451   GFRAA >60 07/22/2015 0451       Component Value Date/Time   WBC 8.9 07/22/2015 0451   WBC 8.6 07/21/2015 0444   WBC 8.6 07/20/2015 0114   HGB 11.5* 07/22/2015 0451   HGB 11.9* 07/21/2015 0444   HGB 11.4* 07/20/2015 0114   HCT 35.4* 07/22/2015 0451   HCT 37.4* 07/21/2015 0444   HCT 35.4* 07/20/2015 0114    MCV 78.5 07/22/2015 0451   MCV 78.6 07/21/2015 0444   MCV 78.5 07/20/2015 0114    Lipid Panel     Component Value Date/Time   CHOL 163 06/19/2015 0810   CHOL 125 03/12/2014 0704   TRIG 186* 06/19/2015 0810   HDL 27* 06/19/2015  0810   HDL 29* 03/12/2014 0704   CHOLHDL 6.0* 06/19/2015 0810   CHOLHDL 4.3 03/12/2014 0704   VLDL 38 03/12/2014 0704   LDLCALC 99 06/19/2015 0810   LDLCALC 58 03/12/2014 0704    ABG No results found for: PHART, PCO2ART, PO2ART, HCO3, TCO2, ACIDBASEDEF, O2SAT   Lab Results  Component Value Date   TSH 0.550 02/03/2011   BNP (last 3 results)  Recent Labs  10/09/14 2159  BNP 52.0    ProBNP (last 3 results) No results for input(s): PROBNP in the last 8760 hours.  Cardiac Panel (last 3 results) No results for input(s): CKTOTAL, CKMB, TROPONINI, RELINDX in the last 72 hours.  Iron/TIBC/Ferritin/ %Sat No results found for: IRON, TIBC, FERRITIN, IRONPCTSAT   EKG Orders placed or performed during the hospital encounter of 07/19/15  . EKG 12-Lead  . EKG 12-Lead  . ED EKG  . ED EKG  . EKG 12-Lead  . EKG 12-Lead  . EKG  . EKG 12-Lead immediately post procedure  . EKG 12-Lead  . EKG 12-Lead immediately post procedure  . EKG 12-Lead  . EKG 12-Lead  . EKG 12-Lead  . EKG 12-Lead  . EKG 12-Lead     Prior Assessment and Plan Problem List as of 07/30/2015      Cardiovascular and Mediastinum   Atrial fibrillation [facility])   Last Assessment & Plan 10/14/2014 Office Visit Written 10/14/2014  2:18 PM by Lendon Colonel, NP    Currently controlled on medication regimen. Heart rate is a little here in the office. Continue watch and wait. He will have no changes in his medicines at this time to see him in 3 months or sooner if he is symptomatic.      Arteriosclerotic cardiovascular disease (ASCVD)   Last Assessment & Plan 10/14/2014 Office Visit Written 10/14/2014  2:19 PM by Lendon Colonel, NP    We will consider repeat stress test should he have  recurrence of symptoms. Evaluate for progression of CAD.      Hypertension   Last Assessment & Plan 10/14/2014 Office Visit Written 10/14/2014  2:18 PM by Lendon Colonel, NP    Currently well-controlled.      Unstable angina (HCC)     Respiratory   Obstructive sleep apnea   COPD (chronic obstructive pulmonary disease) ([place])     Digestive   Colon polyps     Endocrine   Diabetes mellitus [facility])   Last Assessment & Plan 09/07/2011 Office Visit Written 09/07/2011 11:09 AM by Yehuda Savannah, MD    Patient reports excellent control with CBGs consistently less than 130 and a recent A1c level of 6.6.        Other   Hyperlipidemia LDL goal <100   Last Assessment & Plan 10/02/2012 Office Visit Written 10/02/2012  4:24 PM by Lendon Colonel, NP    Labs are followed by Dr. Wolfgang Phoenix. He is advised to increase exercise and activity to lose weight to increase HDL level and decrease LDL.       Tobacco abuse, in remission   Last Assessment & Plan 06/07/2013 Office Visit Written 06/07/2013  4:47 PM by Lendon Colonel, NP    He has restarted smoking, and is unhappy about it. He is considering electronic cigarettes. I have encouraged him to do whatever he can to quit smoking.      Chest pain   Encounter for screening colonoscopy   Last Assessment & Plan 04/29/2014 Office Visit Edited 05/07/2014  3:11  PM by Orvil Feil, NP    59 year old male due for initial screening colonoscopy without any upper or lower concerning GI symptoms. On Eliquis for afib. Will be requesting approval from cardiology to hold X 3 days.   Proceed with colonoscopy with Dr. Oneida Alar in the near future. The risks, benefits, and alternatives have been discussed in detail with the patient. They state understanding and desire to proceed.  Likely hold Eliquis X 3 days; discuss with cardiology  ADDENDUM: ELIQUIS WILL BE HOLD 48 HOURS PER CARDIOLOGY      History of colonic polyps   Genetic testing   Acute chest pain        Imaging: Dg Chest 2 View  07/19/2015  CLINICAL DATA:  Left-sided chest pain with onset earlier today. Initial encounter. EXAM: CHEST  2 VIEW COMPARISON:  10/09/2014 and 08/30/2013 radiographs. FINDINGS: The heart size and mediastinal contours are stable. There is stable mild asymmetric elevation of the right hemidiaphragm. The lungs are clear. There is no pleural effusion or pneumothorax. No acute osseous findings are demonstrated. There is evidence of previous distal right clavicle resection. IMPRESSION: Stable chest.  No active cardiopulmonary process. Electronically Signed   By: Richardean Sale M.D.   On: 07/19/2015 17:45   Nm Myocar Multi W/spect W/wall Motion / Ef  07/09/2015   No diagnostic ST segment abnormalities to indicate ischemia with Lexiscan. Atrial fibrillation present throughout.  Small, moderate intensity, apical to basal inferior defect is partially reversible and suggestive of ischemia most likely in the RCA distribution.  Low to intermediate risk.  Nuclear stress EF: 60%.

## 2015-07-30 NOTE — Patient Instructions (Addendum)
Your physician wants you to follow-up in: 3 months with Shaun Long NP You will receive a reminder letter in the mail two months in advance. If you don't receive a letter, please call our office to schedule the follow-up appointment.   STOP Aspirin in 2 1/2 weeks    If you need a refill on your cardiac medications before your next appointment, please call your pharmacy.       Thank you for choosing [facility] !

## 2015-07-30 NOTE — Telephone Encounter (Signed)
Script ready for pickup. Pt notified on vm script ready to to keep regular follow up visits.

## 2015-08-06 ENCOUNTER — Encounter: Payer: Self-pay | Admitting: Family Medicine

## 2015-08-06 ENCOUNTER — Ambulatory Visit (INDEPENDENT_AMBULATORY_CARE_PROVIDER_SITE_OTHER): Payer: 59 | Admitting: Family Medicine

## 2015-08-06 VITALS — BP 110/76 | Ht 68.0 in | Wt 216.4 lb

## 2015-08-06 DIAGNOSIS — I482 Chronic atrial fibrillation, unspecified: Secondary | ICD-10-CM

## 2015-08-06 DIAGNOSIS — Z79899 Other long term (current) drug therapy: Secondary | ICD-10-CM | POA: Diagnosis not present

## 2015-08-06 DIAGNOSIS — I2510 Atherosclerotic heart disease of native coronary artery without angina pectoris: Secondary | ICD-10-CM | POA: Diagnosis not present

## 2015-08-06 DIAGNOSIS — E785 Hyperlipidemia, unspecified: Secondary | ICD-10-CM

## 2015-08-06 DIAGNOSIS — E119 Type 2 diabetes mellitus without complications: Secondary | ICD-10-CM

## 2015-08-06 NOTE — Progress Notes (Signed)
   Subjective:    Patient ID: Shaun [name], male    DOB: 1955-12-03, 59 y.o.   MRN: [id number] Patient arrives office with follow-up from the hospital with multiple questions and concerns.   Chest Pain  This is a recurrent problem. The current episode started 1 to 4 weeks ago.   in drip number urgency room had a high-grade stenosis this was stented. Patient on blood thinning medications.  Patient is developed also atrial fibrillation. Currently on medicine for that. Feels like it's leading to some fatigue. Off plavix and now and asa  Uses pain med for hip and leg pain, helps a lot in the night as far as mobility  Glu's overlal prety good . Trying to watch diet. No low sugar spells.  Rehab begins shortly, back up the 21st, pt to be careful with     Patient in today for an ER follow up from 07/19/15 for angina  (see ER note).   States no other concerns this visit.  Review of Systems  Cardiovascular: Positive for chest pain.   no chest pain currently improved no headache no abdominal pain     Objective:   Physical Exam Alert vitals stable. HEENT normal blood pressure good on repeat lungs clear heart controlled rate but irregular rhythm ankles without edema no crackles       Assessment & Plan:  Impression 1 coronary artery disease discussed patient states somewhat frustrated that this was not picked up earlier due to his symptomatology. I reminded him how extremely difficult that can be on the front end, and gave him some samples from my own experience. Patient expressed improved understanding October #2 type 2 diabetes control good #3 hypertension controlled. Plan easily 25 minutes spent most in discussion and review of hospital records. Diet exercise discussed. Gradual exercise increase discuss lipid status uncertain will check lipid and liver panel make changes if necessary.

## 2015-08-18 ENCOUNTER — Other Ambulatory Visit: Payer: Self-pay | Admitting: Cardiology

## 2015-08-18 ENCOUNTER — Encounter (HOSPITAL_COMMUNITY): Payer: 59

## 2015-08-24 ENCOUNTER — Other Ambulatory Visit: Payer: Self-pay | Admitting: Cardiology

## 2015-08-26 ENCOUNTER — Telehealth: Payer: Self-pay | Admitting: Gastroenterology

## 2015-08-26 ENCOUNTER — Other Ambulatory Visit: Payer: Self-pay | Admitting: Family Medicine

## 2015-08-26 NOTE — Telephone Encounter (Signed)
Pt called to say he received a letter from DS to set up colonoscopy. He wanted to know if having a shunt/stent being put in recently. Please call [phone]

## 2015-08-27 NOTE — Telephone Encounter (Signed)
Tried to call pt. Many rings and no answer.  

## 2015-08-27 NOTE — Telephone Encounter (Signed)
PLEASE CALL PT. I SPOKE TO KATHRYN. HE NEEDS TO BE ON BLOOD THINNER FOR ONE YEAR BEFORE WE COULD STOP IT FOR AN ELECTIVE PROCEDURE. HE SHOULD CONSIDER TCS IN DEC 2016 UNLESS HE STARTS TO HAVE PERSISTENT RECTAL BLEEDING OR A CHANGE IN HIS BOWEL HABITS. THE BENEFITS OF STOPPING THE BLOOD THINNER SHOULD OUTWEIGH THE RISKS OF HIS HEART STENT GETTING A CLOT IN IT.

## 2015-08-27 NOTE — Telephone Encounter (Signed)
I called pt and he is concerned if he should have the colonoscopy at this time or does he need to wait a little longer. He recently had a 95% blockage and had to have a stent. Shaun [name] he is feeling great now. Please advise if OK to triage and schedule for the near future.

## 2015-09-01 NOTE — Telephone Encounter (Signed)
LMOM to call.

## 2015-09-01 NOTE — Telephone Encounter (Signed)
PT is aware. Please nic for Dec 2017.

## 2015-09-02 ENCOUNTER — Other Ambulatory Visit: Payer: Self-pay | Admitting: Family Medicine

## 2015-09-02 MED ORDER — HYDROCODONE-ACETAMINOPHEN 5-325 MG PO TABS: 0.5000 | ORAL_TABLET | Freq: Every day | ORAL | Status: DC | PRN

## 2015-09-02 NOTE — Telephone Encounter (Signed)
Last seen 09/25/14

## 2015-09-02 NOTE — Telephone Encounter (Signed)
Reminder in epic °

## 2015-09-02 NOTE — Telephone Encounter (Signed)
Patient would like refill on hydrocodone 5/325

## 2015-09-02 NOTE — Telephone Encounter (Signed)
Patient notified script is ready for pick up

## 2015-09-02 NOTE — Telephone Encounter (Signed)
Ok times one 

## 2015-09-11 ENCOUNTER — Ambulatory Visit: Payer: 59 | Admitting: Family Medicine

## 2015-09-16 ENCOUNTER — Other Ambulatory Visit: Payer: Self-pay | Admitting: Family Medicine

## 2015-09-18 ENCOUNTER — Ambulatory Visit: Payer: 59 | Admitting: Family Medicine

## 2015-09-19 ENCOUNTER — Other Ambulatory Visit: Payer: Self-pay | Admitting: Cardiology

## 2015-09-19 ENCOUNTER — Other Ambulatory Visit: Payer: Self-pay | Admitting: Adult Health

## 2015-09-19 LAB — HEPATIC FUNCTION PANEL
ALBUMIN: 4.4 g/dL (ref 3.5–5.5)
ALT: 33 IU/L (ref 0–44)
AST: 20 IU/L (ref 0–40)
Alkaline Phosphatase: 61 IU/L (ref 39–117)
BILIRUBIN TOTAL: 0.2 mg/dL (ref 0.0–1.2)
BILIRUBIN, DIRECT: 0.1 mg/dL (ref 0.00–0.40)
Total Protein: 6.9 g/dL (ref 6.0–8.5)

## 2015-09-19 LAB — LIPID PANEL
CHOLESTEROL TOTAL: 175 mg/dL (ref 100–199)
Chol/HDL Ratio: 6.3 ratio units — ABNORMAL HIGH (ref 0.0–5.0)
HDL: 28 mg/dL — ABNORMAL LOW (ref >39)
LDL CALC: 100 mg/dL — AB (ref 0–99)
TRIGLYCERIDES: 235 mg/dL — AB (ref 0–149)
VLDL Cholesterol Cal: 47 mg/dL — ABNORMAL HIGH (ref 5–40)

## 2015-09-24 ENCOUNTER — Ambulatory Visit (INDEPENDENT_AMBULATORY_CARE_PROVIDER_SITE_OTHER): Payer: 59 | Admitting: Family Medicine

## 2015-09-24 ENCOUNTER — Encounter: Payer: Self-pay | Admitting: Family Medicine

## 2015-09-24 VITALS — BP 106/80 | Ht 68.0 in | Wt 223.2 lb

## 2015-09-24 DIAGNOSIS — F32A Depression, unspecified: Secondary | ICD-10-CM

## 2015-09-24 DIAGNOSIS — E119 Type 2 diabetes mellitus without complications: Secondary | ICD-10-CM | POA: Diagnosis not present

## 2015-09-24 DIAGNOSIS — F329 Major depressive disorder, single episode, unspecified: Secondary | ICD-10-CM

## 2015-09-24 DIAGNOSIS — I10 Essential (primary) hypertension: Secondary | ICD-10-CM | POA: Diagnosis not present

## 2015-09-24 DIAGNOSIS — E785 Hyperlipidemia, unspecified: Secondary | ICD-10-CM

## 2015-09-24 LAB — POCT GLYCOSYLATED HEMOGLOBIN (HGB A1C): Hemoglobin A1C: 5.9

## 2015-09-24 MED ORDER — ESCITALOPRAM OXALATE 10 MG PO TABS: 10.0000 mg | ORAL_TABLET | Freq: Every day | ORAL | Status: DC

## 2015-09-24 MED ORDER — ATORVASTATIN CALCIUM 80 MG PO TABS: 80.0000 mg | ORAL_TABLET | Freq: Every day | ORAL | Status: DC

## 2015-09-24 NOTE — Progress Notes (Signed)
   Subjective:    Patient ID: Shaun [name], male    DOB: 09/19/1955, 60 y.o.   MRN: [id number]  patient arrives office with several distinct concerns. Diabetes He presents for his follow-up diabetic visit. He has type 2 diabetes mellitus. There are no hypoglycemic associated symptoms. There are no diabetic associated symptoms. There are no hypoglycemic complications. There are no diabetic complications. There are no known risk factors for coronary artery disease. Current diabetic treatment includes oral agent (monotherapy). He is compliant with treatment all of the time.   Patient states that he experiencing anxiety and is very quick tempered now. He would like to discuss this with the doctor today.  More irritable at times. More. Feeling down. Film more stressed out.  No suicidal thoughts no homicidal thoughts  Compliant blood pressure medicine. No obvious side effects. Meds reviewed today. Watching salt intake.  Results for orders placed or performed in visit on 09/24/15  POCT glycosylated hemoglobin (Hb A1C)  Result Value Ref Range   Hemoglobin A1C 5.9      Review of Systems  no headache no chest pain some shortness of breath with exertion no nausea no diaphoresis    Objective:   Physical Exam  alert slight depressed affect. HEENT normal. Lungs clear heart rhythm irregular but good control ankles trace edema bilateral       Assessment & Plan:   impression 1 depression/anxiety. Common postcardiac intervention discussed at length #2 type 2 diabetes good control to maintain same #3 hypertension good control maintain same meds #4 atrial fibrillation discussed #5 hyperlipidemia. LDL suboptimal for situation discussed plan increase Lipitor to 80. Symptom care discussed. Initiate Lexapro 10 daily. Recheck in several months. WSL

## 2015-10-01 ENCOUNTER — Telehealth: Payer: Self-pay | Admitting: Family Medicine

## 2015-10-01 MED ORDER — HYDROCODONE-ACETAMINOPHEN 5-325 MG PO TABS: 0.5000 | ORAL_TABLET | Freq: Every day | ORAL | Status: DC | PRN

## 2015-10-01 NOTE — Telephone Encounter (Signed)
Left message on voicemail notifying patient that script is ready for pickup.  

## 2015-10-01 NOTE — Telephone Encounter (Signed)
HYDROcodone-acetaminophen (NORCO/VICODIN) 5-325 MG tablet   Please refill an call when ready for pick up

## 2015-10-01 NOTE — Telephone Encounter (Signed)
Ref times one 

## 2015-10-29 ENCOUNTER — Telehealth: Payer: Self-pay | Admitting: Family Medicine

## 2015-10-29 NOTE — Telephone Encounter (Signed)
HYDROcodone-acetaminophen (NORCO/VICODIN) 5-325 MG tablet ° °Refill please  °

## 2015-10-30 ENCOUNTER — Other Ambulatory Visit: Payer: Self-pay | Admitting: Cardiology

## 2015-11-05 ENCOUNTER — Telehealth: Payer: Self-pay | Admitting: Family Medicine

## 2015-11-05 MED ORDER — HYDROCODONE-ACETAMINOPHEN 5-325 MG PO TABS: 0.5000 | ORAL_TABLET | Freq: Every day | ORAL | Status: DC | PRN

## 2015-11-05 NOTE — Telephone Encounter (Signed)
Patient may have a prescription for the hydrocodone but he must go ahead and schedule an office visit with Dr. Richardson Landry somewhere in the next 3 weeks to 4 weeks so that he can discuss with Dr. Richardson Landry his ongoing needs of this medication and get on a regular approach to minimize frequent calls for prescription

## 2015-11-05 NOTE — Telephone Encounter (Signed)
Rx up front for patient pick up. Patient notified and advised he needs office visit for further refills.

## 2015-11-05 NOTE — Telephone Encounter (Signed)
Pt called 3/2 I sent back message, does not seem to have been answered and was  Closed out from what I can tell. Pt would like to know if we can go ahead an get this taken  Care of today please

## 2015-11-05 NOTE — Telephone Encounter (Signed)
Message was a request for refill of hydrocodone- message was routed to Dr Richardson Landry and he did not respond-patient also requested hydrocodone last month by phone and was given script and told refill one by phone and needs office visit for pain management

## 2015-11-20 ENCOUNTER — Other Ambulatory Visit: Payer: Self-pay | Admitting: Family Medicine

## 2015-12-03 ENCOUNTER — Encounter: Payer: Self-pay | Admitting: Cardiology

## 2015-12-03 ENCOUNTER — Ambulatory Visit (INDEPENDENT_AMBULATORY_CARE_PROVIDER_SITE_OTHER): Payer: 59 | Admitting: Cardiology

## 2015-12-03 VITALS — BP 110/68 | HR 81 | Ht 68.0 in | Wt 217.0 lb

## 2015-12-03 DIAGNOSIS — I2510 Atherosclerotic heart disease of native coronary artery without angina pectoris: Secondary | ICD-10-CM | POA: Diagnosis not present

## 2015-12-03 DIAGNOSIS — I4891 Unspecified atrial fibrillation: Secondary | ICD-10-CM | POA: Diagnosis not present

## 2015-12-03 DIAGNOSIS — E785 Hyperlipidemia, unspecified: Secondary | ICD-10-CM

## 2015-12-03 DIAGNOSIS — I10 Essential (primary) hypertension: Secondary | ICD-10-CM | POA: Diagnosis not present

## 2015-12-03 MED ORDER — METOPROLOL TARTRATE 50 MG PO TABS: 75.0000 mg | ORAL_TABLET | Freq: Two times a day (BID) | ORAL | Status: DC

## 2015-12-03 NOTE — Progress Notes (Signed)
Patient ID: Shaun [name], male   DOB: November 28, 1955, 60 y.o.   MRN: [id number]     Clinical Summary Shaun [name] is a 60 y.o.male seen today for follow up of the following medical problems.    1. Paroxysmal afib  - reports just occasional palpitations, infrequent. Lasts 5-10 minutes.  - no bleeding troubles on eliquis   2. CAD - mild non-obstructive cath in 2000, recent MPI Jan 2015 without any ischemia  - repeat cath 06/2015 in setting of UA, received DES to LCX. Echo at that time showed LVEF 60-65%.   - denies any chest. No SOB or DOE - compliant with meds  3. HTN  - checks at home regularly, typically 130s/80s - compliant with meds    4. Hyperlipidemia  - compliant with statin - Jan 2017 TC 175 TG 235 HDL 28 LDL 100    SH: works as Librarian, academic at [employer] Past Medical History  Diagnosis Date  . Paroxysmal atrial fibrillation ([place]) 10/2004    Onset in 10/2004; recurred 09/2008  . Arteriosclerotic cardiovascular disease (ASCVD)     Nonobstructive; cath in 2000: 30-40% mid LAD and proximal RCA;normal EF. stress nuclear in 2006-subtle inferoseptal hypoperfusion with reversibility; negative stress EKG; good exercise tolerance  . Hypertension   . Sinusitis   . Insomnia   . Obstructive sleep apnea 12/2009    01/26/2010 AHI 83/hr  . Diabetes mellitus without complication ([place])   . PUD (peptic ulcer disease)     1980s  . Hyperlipidemia   . RLS (restless legs syndrome)   . Asthma   . CHF (congestive heart failure) ([place])   . Colon polyps     30 colon polyps found on first colonoscopy     No Known Allergies   Current Outpatient Prescriptions  Medication Sig Dispense Refill  . albuterol-ipratropium (COMBIVENT) 18-103 MCG/ACT inhaler Inhale 2 puffs into the lungs every 6 (six) hours as needed. (Patient taking differently: Inhale 2 puffs into the lungs every 6 (six) hours as needed for wheezing or shortness of breath. ) 14.7 g 5  . aspirin 81 MG  tablet Take 81 mg by mouth daily.    Marland Kitchen atorvastatin (LIPITOR) 80 MG tablet Take 1 tablet (80 mg total) by mouth daily. 30 tablet 5  . chlorthalidone (HYGROTON) 25 MG tablet TAKE 1 TABLET BY MOUTH EVERY DAY AS NEEDED FOR FLUID RETENTION 30 tablet 6  . clopidogrel (PLAVIX) 75 MG tablet Take 1 tablet (75 mg total) by mouth daily with breakfast. 90 tablet 3  . DIGOX 250 MCG tablet TAKE 1 TABLET BY MOUTH EVERY MORNING 30 tablet 6  . diltiazem (CARDIZEM CD) 120 MG 24 hr capsule Take 1 capsule (120 mg total) by mouth daily. 30 capsule 3  . ELIQUIS 5 MG TABS tablet TAKE 1 TABLET BY MOUTH TWICE DAILY 60 tablet 6  . escitalopram (LEXAPRO) 10 MG tablet Take 1 tablet (10 mg total) by mouth daily. 30 tablet 5  . HYDROcodone-acetaminophen (NORCO/VICODIN) 5-325 MG tablet Take 0.5-1 tablets by mouth daily as needed. 30 tablet 0  . lisinopril (PRINIVIL,ZESTRIL) 5 MG tablet TAKE 1 TABLET BY MOUTH DAILY 90 tablet 3  . metFORMIN (GLUCOPHAGE) 500 MG tablet TAKE 2 TABLETS BY MOUTH TWICE DAILY WITH A MEAL 120 tablet 5  . Multiple Vitamins-Minerals (MULTIVITAMIN WITH MINERALS) tablet Take 1 tablet by mouth daily.      . nitroGLYCERIN (NITROSTAT) 0.4 MG SL tablet Place 1 tablet (0.4 mg total) under the tongue every 5 (five)  minutes as needed. (Patient taking differently: Place 0.4 mg under the tongue every 5 (five) minutes as needed for chest pain. ) 25 tablet 3  . Omega-3 Fatty Acids (FISH OIL PO) Take 1 tablet by mouth daily.    . pantoprazole (PROTONIX) 40 MG tablet Take 1 tablet (40 mg total) by mouth daily. 30 tablet 3  . rOPINIRole (REQUIP) 2 MG tablet TAKE 1 TABLET BY MOUTH EVERY NIGHT AT BEDTIME 30 tablet 5  . sodium chloride (OCEAN) 0.65 % SOLN nasal spray Place 1 spray into both nostrils as needed for congestion.    . TAZTIA XT 360 MG 24 hr capsule TAKE 1 CAPSULE BY MOUTH EVERY DAY 30 capsule 3  . traZODone (DESYREL) 50 MG tablet TAKE 1 TABLET BY MOUTH EVERY NIGHT AT BEDTIME FOR 7 DAYS THEN TAKE 2 TABLETS BY  MOUTH EVERY NIGHT AT BEDTIME 60 tablet 0  . traZODone (DESYREL) 50 MG tablet TAKE 2 TABLETS(100 MG) BY MOUTH AT BEDTIME 60 tablet 0  . triamcinolone cream (KENALOG) 0.1 % Apply 1 application topically 2 (two) times daily. 60 g 1  . VIAGRA 100 MG tablet TAKE 1 TABLET BY MOUTH EVERY DAY AS NEEDED FOR ERECTILE DYSFUNCTION 6 tablet 3   No current facility-administered medications for this visit.     Past Surgical History  Procedure Laterality Date  . Hernia repair  1986    Left inguinal  . Rotator cuff repair      Right  . Colonoscopy N/A 05/19/2014    Dr. Barnie Alderman diverticulosis/moderate external hemorrhoids, >20 simple adenomas. Genetic screening negative.   . Cardiac catheterization N/A 07/21/2015    Procedure: Left Heart Cath and Coronary Angiography;  Surgeon: Peter M Martinique, MD;  Location: [facility];  Service: Cardiovascular;  Laterality: N/A;  . Cardiac catheterization N/A 07/21/2015    Procedure: Coronary Stent Intervention;  Surgeon: Peter M Martinique, MD;  Location: [facility];  Service: Cardiovascular;  Laterality: N/A;     No Known Allergies    Family History  Problem Relation Age of Onset  . Hypertension Mother   . Breast cancer Mother 65  . Heart attack Father   . Diabetes Brother   . Colon cancer Neg Hx   . Skin cancer Sister 66  . Brain cancer Maternal Uncle   . Cancer Maternal Uncle     NOS  . Breast cancer Cousin     maternal cousin dx <50     Social History Shaun [name] reports that he quit smoking about 2 years ago. His smoking use included Cigarettes. He started smoking about 45 years ago. He has a 32 pack-year smoking history. He has never used smokeless tobacco. Shaun [name] reports that he does not drink alcohol.   Review of Systems CONSTITUTIONAL: No weight loss, fever, chills, weakness or fatigue.  HEENT: Eyes: No visual loss, blurred vision, double vision or yellow sclerae.No hearing loss, sneezing, congestion, runny nose or  sore throat.  SKIN: No rash or itching.  CARDIOVASCULAR: per HPI RESPIRATORY: No shortness of breath, cough or sputum.  GASTROINTESTINAL: No anorexia, nausea, vomiting or diarrhea. No abdominal pain or blood.  GENITOURINARY: No burning on urination, no polyuria NEUROLOGICAL: No headache, dizziness, syncope, paralysis, ataxia, numbness or tingling in the extremities. No change in bowel or bladder control.  MUSCULOSKELETAL: No muscle, back pain, joint pain or stiffness.  LYMPHATICS: No enlarged nodes. No history of splenectomy.  PSYCHIATRIC: No history of depression or anxiety.  ENDOCRINOLOGIC: No reports of sweating,  cold or heat intolerance. No polyuria or polydipsia.  Marland Kitchen   Physical Examination Filed Vitals:   12/03/15 0940  BP: 110/68  Pulse: 81   Filed Vitals:   12/03/15 0940  Height: 5\' 8"  (1.727 m)  Weight: 217 lb (98.431 kg)    Gen: resting comfortably, no acute distress HEENT: no scleral icterus, pupils equal round and reactive, no palptable cervical adenopathy,  CV: RRR, no m/r/g, no Resp: Clear to auscultation bilaterally GI: abdomen is soft, non-tender, non-distended, normal bowel sounds, no hepatosplenomegaly MSK: extremities are warm, no edema.  Skin: warm, no rash Neuro:  no focal deficits Psych: appropriate affect   Diagnostic Studies Jan 2015 MPI  IMPRESSION: 1. Normal Lexiscan Cardiolite stress test. 2. No evidence of ischemia or scar. 3. Normal left ventricular systolic function, calculated LVEF 62%.   06/2015 Cath Prox RCA lesion, 20% stenosed.  Prox LAD lesion, 20% stenosed.  Ost 1st Diag to 1st Diag lesion, 30% stenosed.  Mid Cx lesion, 95% stenosed. Post intervention, there is a 0% residual stenosis. 1. Single vessel obstructive CAD. Critical stenosis in the mid LCx at the bifurcation of the second and third OM branches.  2. Successful stenting of the LCx into the third OM with a DES. Continued patency of the second OM  Plan: DAPT with ASA and  Plavix for one month then stop ASA. Continue Plavix for one year. May resume Eliquis in the am. Anticipate DC tomorrow.    06/2015 echo Study Conclusions  - Left ventricle: The cavity size was normal. Wall thickness was  increased in a pattern of moderate LVH. Systolic function was  normal. The estimated ejection fraction was in the range of 60%  to 65%. Wall motion was normal; there were no regional wall  motion abnormalities. The study is not technically sufficient to  allow evaluation of LV diastolic function. - Mitral valve: Mildly thickened leaflets . There was mild  regurgitation. - Left atrium: The atrium was mildly dilated. - Right atrium: Central venous pressure (est): 3 mm Hg. - Tricuspid valve: There was trivial regurgitation. - Pulmonary arteries: Systolic pressure could not be accurately  estimated. - Pericardium, extracardiac: A trivial pericardial effusion was  identified.  Impressions:  - Moderate LVH with LVEF 60-65%. Indeterminate diastolic function.  Mild left atrial enlargement. Mildly thickened mitral leaflets  with mild mitral regurgitation. Trivial tricuspid regurgitation.  Trivial pericardial effusion.  Assessment and Plan   1. Paroxysmal afib  -no current symptoms - given history of CAD and unstable angina would prefer beta blocker as opposed to CCB, will d/c dilt and digoxin and start lopressor 75mg  bid.   2. HTN  - at goal, follow with changes described above  3. Hyperlipidemia  - at goal, continue current meds   F/u 6 months     Arnoldo Lenis, M.D.

## 2015-12-03 NOTE — Patient Instructions (Signed)
Your physician wants you to follow-up in: 6 months with Dr Bryna Colander will receive a reminder letter in the mail two months in advance. If you don't receive a letter, please call our office to schedule the follow-up appointment.    STOP Digoxin  STOP Diltiazem   START  Lopressor 75 mg ( 1 1/2 tablets) twice a day    If you need a refill on your cardiac medications before your next appointment, please call your pharmacy.    Thank you for choosing [facility] !

## 2015-12-04 ENCOUNTER — Ambulatory Visit (HOSPITAL_COMMUNITY): Admission: RE | Admit: 2015-12-04 | Payer: 59 | Source: Ambulatory Visit

## 2015-12-04 ENCOUNTER — Ambulatory Visit (INDEPENDENT_AMBULATORY_CARE_PROVIDER_SITE_OTHER): Payer: 59 | Admitting: Family Medicine

## 2015-12-04 ENCOUNTER — Encounter: Payer: Self-pay | Admitting: Family Medicine

## 2015-12-04 ENCOUNTER — Ambulatory Visit (HOSPITAL_COMMUNITY)
Admission: RE | Admit: 2015-12-04 | Discharge: 2015-12-04 | Disposition: A | Discharge status: Home / Self Care | Payer: 59 | Source: Ambulatory Visit | Attending: Family Medicine | Admitting: Family Medicine

## 2015-12-04 VITALS — BP 130/82 | Ht 68.0 in | Wt 219.0 lb

## 2015-12-04 DIAGNOSIS — E785 Hyperlipidemia, unspecified: Secondary | ICD-10-CM

## 2015-12-04 DIAGNOSIS — E119 Type 2 diabetes mellitus without complications: Secondary | ICD-10-CM

## 2015-12-04 DIAGNOSIS — Z79899 Other long term (current) drug therapy: Secondary | ICD-10-CM

## 2015-12-04 DIAGNOSIS — F32A Depression, unspecified: Secondary | ICD-10-CM

## 2015-12-04 DIAGNOSIS — M25551 Pain in right hip: Secondary | ICD-10-CM | POA: Insufficient documentation

## 2015-12-04 DIAGNOSIS — F329 Major depressive disorder, single episode, unspecified: Secondary | ICD-10-CM | POA: Diagnosis not present

## 2015-12-04 MED ORDER — HYDROCODONE-ACETAMINOPHEN 5-325 MG PO TABS: 0.5000 | ORAL_TABLET | Freq: Three times a day (TID) | ORAL | Status: DC | PRN

## 2015-12-04 MED ORDER — ESCITALOPRAM OXALATE 20 MG PO TABS: 20.0000 mg | ORAL_TABLET | Freq: Every day | ORAL | Status: DC

## 2015-12-04 NOTE — Progress Notes (Signed)
   Subjective:    Patient ID: Shaun [name], male    DOB: Jan 12, 1956, 60 y.o.   MRN: [id number] Patient arrives office with multiple concerns Hyperlipidemia This is a chronic problem. The current episode started more than 1 year ago. Treatments tried: lipitor. There are no compliance problems.  Risk factors for coronary artery disease include dyslipidemia, hypertension and diabetes mellitus.   Results for orders placed or performed in visit on 09/24/15  POCT glycosylated hemoglobin (Hb A1C)  Result Value Ref Range   Hemoglobin A1C 5.9     Lipitor increased at last visit and, patient states overall tolerating well, no obvious side effects. Next  Patient trying hard to work on his diet. Scott sugar down.   lexapro initiated., eased some but not a lot, exercising reg. States Lexapro has helped a bit was hopeful that it would help more.  Right hip pain progressive over the last five yrs  Certain activities experiences more pain, then hurts and gels after sitting, hurts more with extension of the hip   Left leg pain is getting worse.   Review of Systems No headache, no major weight loss or weight gain, no chest pain no back pain abdominal pain no change in bowel habits complete ROS otherwise negative     Objective:   Physical Exam  Alert vital stable lungs clear heart not regular in rhythm, but controlled rate blood pressure good on repeat, right hip positive pain with internal and external rotation and flexion ankles without edema      Assessment & Plan:  Impression 1 type 2 diabetes control good discuss #2 right hip pain progressive likely arthritis discuss #3 depression ongoing challenge somewhat improved #4 chronic right hip pain and back with current amount of pain medicine not enough. Plan check appropriate blood work. Right hip x-ray. Increased pain medicine rationale discussed diet exercise discussed recheck in several months WSL

## 2015-12-22 ENCOUNTER — Other Ambulatory Visit: Payer: Self-pay | Admitting: Family Medicine

## 2015-12-24 ENCOUNTER — Ambulatory Visit: Payer: 59 | Admitting: Family Medicine

## 2015-12-28 ENCOUNTER — Other Ambulatory Visit: Payer: Self-pay | Admitting: Family Medicine

## 2016-01-01 ENCOUNTER — Encounter: Payer: Self-pay | Admitting: Family Medicine

## 2016-01-01 ENCOUNTER — Ambulatory Visit (INDEPENDENT_AMBULATORY_CARE_PROVIDER_SITE_OTHER): Payer: 59 | Admitting: Family Medicine

## 2016-01-01 VITALS — BP 122/82 | Temp 98.9°F | Ht 68.0 in | Wt 214.2 lb

## 2016-01-01 DIAGNOSIS — J329 Chronic sinusitis, unspecified: Secondary | ICD-10-CM | POA: Diagnosis not present

## 2016-01-01 DIAGNOSIS — J209 Acute bronchitis, unspecified: Secondary | ICD-10-CM

## 2016-01-01 MED ORDER — BENZONATATE 100 MG PO CAPS: 100.0000 mg | ORAL_CAPSULE | Freq: Two times a day (BID) | ORAL | Status: DC | PRN

## 2016-01-01 MED ORDER — LEVOFLOXACIN 500 MG PO TABS: 500.0000 mg | ORAL_TABLET | Freq: Every day | ORAL | Status: AC

## 2016-01-01 MED ORDER — IPRATROPIUM-ALBUTEROL 18-103 MCG/ACT IN AERO: 2.0000 | INHALATION_SPRAY | Freq: Four times a day (QID) | RESPIRATORY_TRACT | Status: DC | PRN

## 2016-01-01 NOTE — Progress Notes (Signed)
   Subjective:    Patient ID: Shaun [name], male    DOB: [birth year], 60 y.o.   MRN: [id number]  Cough This is a new problem. The current episode started in the past 7 days. Associated symptoms include a fever, headaches, nasal congestion, a sore throat and wheezing. Associated symptoms comments: vomiting. Treatments tried: cloricedn.   tue night multi vomiting spells  Sig nausea stopped  Legs cramping an dsore   Cough off and on and bad with coughing head off a bit   clora cidin prn for cough   No know n exposures at home, but some at work  Review of Systems  Constitutional: Positive for fever.  HENT: Positive for sore throat.   Respiratory: Positive for cough and wheezing.   Neurological: Positive for headaches.       Objective:   Physical Exam        Assessment & Plan:  Alert vital stable moderate malaise positive nasal congestion frontal tenderness pharynx normal lungs wheezy cough no crackles heart regular in rhythm  Impression rhinosinusitis/bronchitis doubt pneumonia positive reactive airway component plan antibiotics prescribed. Albuterol when necessary symptom care discussed For Cough Warning Signs Discussed Carefully

## 2016-01-07 ENCOUNTER — Other Ambulatory Visit: Payer: Self-pay | Admitting: Cardiology

## 2016-01-17 ENCOUNTER — Other Ambulatory Visit: Payer: Self-pay | Admitting: Family Medicine

## 2016-01-24 ENCOUNTER — Other Ambulatory Visit: Payer: Self-pay | Admitting: Family Medicine

## 2016-02-23 ENCOUNTER — Other Ambulatory Visit: Payer: Self-pay | Admitting: Family Medicine

## 2016-03-04 ENCOUNTER — Encounter: Payer: Self-pay | Admitting: Family Medicine

## 2016-03-04 ENCOUNTER — Ambulatory Visit (INDEPENDENT_AMBULATORY_CARE_PROVIDER_SITE_OTHER): Payer: 59 | Admitting: Family Medicine

## 2016-03-04 VITALS — BP 118/72 | Ht 68.0 in | Wt 220.2 lb

## 2016-03-04 DIAGNOSIS — E119 Type 2 diabetes mellitus without complications: Secondary | ICD-10-CM

## 2016-03-04 LAB — POCT GLYCOSYLATED HEMOGLOBIN (HGB A1C): Hemoglobin A1C: 6.6

## 2016-03-04 MED ORDER — HYDROCODONE-ACETAMINOPHEN 5-325 MG PO TABS: 0.5000 | ORAL_TABLET | Freq: Three times a day (TID) | ORAL | Status: DC | PRN

## 2016-03-04 MED ORDER — AZITHROMYCIN 250 MG PO TABS
ORAL_TABLET | ORAL | Status: DC
Start: 2016-03-04 — End: 2016-05-17

## 2016-03-04 NOTE — Progress Notes (Signed)
   Subjective:    Patient ID: Shaun [name], male    DOB: 1956/01/04, 60 y.o.   MRN: [id number]  Diabetes He presents for his follow-up diabetic visit. He has type 2 diabetes mellitus. No MedicAlert identification noted. He has not had a previous visit with a dietitian. He does not see a podiatrist.Eye exam is current.    Patient has concerns of congestion, and cough. Onset today. greensih phlegm, prod of gunky cough. No fever. Uses inhaler around once per wk  Patient notes ongoing compliance with antidepressant medication. No obvious side effects. Reports does not miss a dose. Overall continues to help depression substantially. No thoughts of homicide or suicide. Would like to maintain medication.  Htn overall good control, overall numbers 126 or so, usually 128 or so. Highest low number is generally good    Results for orders placed or performed in visit on 03/04/16  POCT HgB A1C  Result Value Ref Range   Hemoglobin A1C 6.6     Review of Systems No headache, no major weight loss or weight gain, no chest pain no back pain abdominal pain no change in bowel habits complete ROS otherwise negative     Objective:   Physical Exam  Alert vitals stable. No acute distress. Blood pressure good on repeat. HEENT normal. Lungs clear. Heart rhythm irregular but good controlled rate ankles no significant edema      Assessment & Plan:  Impression and plan 1 type 2 diabetes discussed maintain same meds meds reviewed #2 hypertension good control maintain same meds meds reviewed #3 depression ongoing to maintain meds discussed and reviewed #4 hyperlipidemia status uncertain prior blood work reviewed further blood work needed. Pain medication prescribed. Patient states hydrocodone definitely helping. No obvious side effects

## 2016-03-08 ENCOUNTER — Ambulatory Visit (INDEPENDENT_AMBULATORY_CARE_PROVIDER_SITE_OTHER): Payer: 59 | Admitting: Family Medicine

## 2016-03-08 ENCOUNTER — Encounter: Payer: Self-pay | Admitting: Family Medicine

## 2016-03-08 VITALS — BP 124/84 | Temp 97.5°F | Ht 68.0 in | Wt 214.0 lb

## 2016-03-08 DIAGNOSIS — J441 Chronic obstructive pulmonary disease with (acute) exacerbation: Secondary | ICD-10-CM | POA: Diagnosis not present

## 2016-03-08 DIAGNOSIS — J683 Other acute and subacute respiratory conditions due to chemicals, gases, fumes and vapors: Secondary | ICD-10-CM

## 2016-03-08 DIAGNOSIS — J209 Acute bronchitis, unspecified: Secondary | ICD-10-CM

## 2016-03-08 DIAGNOSIS — J4520 Mild intermittent asthma, uncomplicated: Secondary | ICD-10-CM | POA: Diagnosis not present

## 2016-03-08 MED ORDER — PREDNISONE 20 MG PO TABS: ORAL_TABLET | ORAL | Status: DC

## 2016-03-08 MED ORDER — LEVOFLOXACIN 500 MG PO TABS: 500.0000 mg | ORAL_TABLET | Freq: Every day | ORAL | Status: DC

## 2016-03-08 NOTE — Progress Notes (Signed)
   Subjective:    Patient ID: Shaun [name], male    DOB: 03/21/1956, 60 y.o.   MRN: [id number]  Cough This is a new problem. The current episode started in the past 7 days. The cough is productive of sputum. Associated symptoms include a fever, headaches, rhinorrhea and wheezing. Associated symptoms comments: Congestion. Treatments tried: Z Pak.   Patient states no other concerns this visit.  Review of Systems  Constitutional: Positive for fever.  HENT: Positive for rhinorrhea.   Respiratory: Positive for cough and wheezing.   Neurological: Positive for headaches.       Objective:   Physical Exam  Alert no apparent distress. Moderate malaise H&T slight nasal congestion pharynx normal lungs bilateral rhonchi plus reactive airways      Assessment & Plan:  Impression rhinosinusitis/bronchitis with reactive airways also history of COPD discussed between flares does fine so no need for long-term medicine at this time. Plan prednisone taper. Moved to Levaquin. Use Combivent every 4 hours of note patient quit smoking several years ago and has no plans to start again [facility]

## 2016-03-24 ENCOUNTER — Other Ambulatory Visit: Payer: Self-pay | Admitting: Family Medicine

## 2016-04-18 ENCOUNTER — Other Ambulatory Visit: Payer: Self-pay | Admitting: Family Medicine

## 2016-04-20 ENCOUNTER — Other Ambulatory Visit: Payer: Self-pay | Admitting: Cardiology

## 2016-04-27 ENCOUNTER — Telehealth: Payer: Self-pay | Admitting: Gastroenterology

## 2016-04-27 NOTE — Telephone Encounter (Signed)
Pt is on the Oct recall list as OV vs Triage for a colonoscopy in Dec 2017. Which one does the patient need?

## 2016-05-04 NOTE — Telephone Encounter (Signed)
I PERSONALLY REVIEWED TCS REPORT.Pt needs OPV FOR TCS IF HE'S ON ELIQUIS. OTHERWISE HE CAN BE TRIAGED FOR TCS WITHIN THE NEXT 30 DAYS. WAS SUPPOSE TO HAVE TCS DEC 2016.

## 2016-05-04 NOTE — Telephone Encounter (Signed)
Forwarding to Dr. Fields to advise! 

## 2016-05-09 NOTE — Telephone Encounter (Signed)
LMOM to call.

## 2016-05-17 ENCOUNTER — Ambulatory Visit (INDEPENDENT_AMBULATORY_CARE_PROVIDER_SITE_OTHER): Payer: 59 | Admitting: Family Medicine

## 2016-05-17 ENCOUNTER — Encounter: Payer: Self-pay | Admitting: Family Medicine

## 2016-05-17 VITALS — BP 126/78 | Ht 68.0 in | Wt 218.4 lb

## 2016-05-17 DIAGNOSIS — Z23 Encounter for immunization: Secondary | ICD-10-CM

## 2016-05-17 DIAGNOSIS — M1990 Unspecified osteoarthritis, unspecified site: Secondary | ICD-10-CM | POA: Diagnosis not present

## 2016-05-17 DIAGNOSIS — M25551 Pain in right hip: Secondary | ICD-10-CM

## 2016-05-17 DIAGNOSIS — M1611 Unilateral primary osteoarthritis, right hip: Secondary | ICD-10-CM

## 2016-05-17 MED ORDER — ETODOLAC 400 MG PO TABS: 400.0000 mg | ORAL_TABLET | Freq: Two times a day (BID) | ORAL | 0 refills | Status: DC

## 2016-05-17 NOTE — Progress Notes (Signed)
   Subjective:    Patient ID: Shaun [name], male    DOB: [birth year], 60 y.o.   MRN: [id number]  HPI Patient arrives with c/o right hip pain for several years. Right hip more painful  Had to jump pn a lifttrouble working  Deep sharp paoin and deep achey and superior thight  No night pain, worse withn omtotion   ocer did it as far as pressure on hp    Review of Systems No headache, no major weight loss or weight gain, no chest pain no back pain abdominal pain no change in bowel habits complete ROS otherwise negative     Objective:   Physical Exam Alert vitals stable, NAD. Blood pressure good on repeat. HEENT normal. Lungs clear. Heart regular rate and rhythm. Right hip negative straight leg raise positive pain with rotation no trochanteric tenderness no sciatic notch tenderness       Assessment & Plan:  Impression flare of right hip arthritis known arthritis confirmed on x-ray plan Lodine twice a day just for 5 days. Patient just got off steroids would like to not repeat with diabetes, realize patient is on L requests a just a few days of anti-inflammatory should be tolerated tolerated and couldhelp

## 2016-05-18 ENCOUNTER — Other Ambulatory Visit: Payer: Self-pay | Admitting: Family Medicine

## 2016-05-19 NOTE — Telephone Encounter (Signed)
Letter mailed to pt to call.  

## 2016-05-30 ENCOUNTER — Other Ambulatory Visit: Payer: Self-pay | Admitting: Family Medicine

## 2016-06-03 ENCOUNTER — Ambulatory Visit: Payer: 59 | Admitting: Family Medicine

## 2016-06-07 ENCOUNTER — Other Ambulatory Visit: Payer: Self-pay | Admitting: Family Medicine

## 2016-06-10 ENCOUNTER — Ambulatory Visit (INDEPENDENT_AMBULATORY_CARE_PROVIDER_SITE_OTHER): Payer: 59 | Admitting: Family Medicine

## 2016-06-10 ENCOUNTER — Encounter: Payer: Self-pay | Admitting: Family Medicine

## 2016-06-10 VITALS — BP 118/78 | Ht 68.0 in | Wt 216.1 lb

## 2016-06-10 DIAGNOSIS — F339 Major depressive disorder, recurrent, unspecified: Secondary | ICD-10-CM | POA: Diagnosis not present

## 2016-06-10 DIAGNOSIS — M1611 Unilateral primary osteoarthritis, right hip: Secondary | ICD-10-CM

## 2016-06-10 DIAGNOSIS — I10 Essential (primary) hypertension: Secondary | ICD-10-CM

## 2016-06-10 DIAGNOSIS — E785 Hyperlipidemia, unspecified: Secondary | ICD-10-CM | POA: Diagnosis not present

## 2016-06-10 DIAGNOSIS — E119 Type 2 diabetes mellitus without complications: Secondary | ICD-10-CM

## 2016-06-10 DIAGNOSIS — J4520 Mild intermittent asthma, uncomplicated: Secondary | ICD-10-CM | POA: Diagnosis not present

## 2016-06-10 DIAGNOSIS — Z125 Encounter for screening for malignant neoplasm of prostate: Secondary | ICD-10-CM | POA: Diagnosis not present

## 2016-06-10 DIAGNOSIS — J683 Other acute and subacute respiratory conditions due to chemicals, gases, fumes and vapors: Secondary | ICD-10-CM

## 2016-06-10 LAB — POCT GLYCOSYLATED HEMOGLOBIN (HGB A1C): Hemoglobin A1C: 5.5

## 2016-06-10 MED ORDER — HYDROCODONE-ACETAMINOPHEN 5-325 MG PO TABS: 0.5000 | ORAL_TABLET | Freq: Three times a day (TID) | ORAL | 0 refills | Status: DC | PRN

## 2016-06-10 MED ORDER — ESCITALOPRAM OXALATE 20 MG PO TABS: ORAL_TABLET | ORAL | 5 refills | Status: DC

## 2016-06-10 NOTE — Progress Notes (Signed)
   Subjective:    Patient ID: Shaun [name], male    DOB: [birth year], 60 y.o.   MRN: [id number]  Diabetes  He presents for his follow-up diabetic visit. He has type 2 diabetes mellitus. No MedicAlert identification noted. Eye exam is not current.   Patient states no other concerns this visit.   Results for orders placed or performed in visit on 06/10/16  POCT HgB A1C  Result Value Ref Range   Hemoglobin A1C 5.5    Hip has calmed down  Patient claims compliance with diabetes medication. No obvious side effects. Reports no substantial low sugar spells. Most numbers are generally in good range when checked fasting. Generally does not miss a dose of medication. Watching diabetic diet closely  Blood pressure medicine and blood pressure levels reviewed today with patient. Compliant with blood pressure medicine. States does not miss a dose. No obvious side effects. Blood pressure generally good when checked elsewhere. Watching salt intake.  Patient states still definitely needs to take his pain medicine. Patient notes ongoing compliance with antidepressant medication. No obvious side effects. Reports does not miss a dose. Overall continues to help depression substantially. No thoughts of homicide or suicide. Would like to maintain medication.  Went on web MD and got a hip   Walking reg off and on, Blood sugars geernally in good control 108 to 112  Highest number ususally see I genr decet  No sig low sugar splells  Results for orders placed or performed in visit on 06/10/16  POCT HgB A1C  Result Value Ref Range   Hemoglobin A1C 5.5     Notes pain medicine is crucial for ongoing function. Review of Systems No headache, no major weight loss or weight gain, no chest pain no back pain abdominal pain no change in bowel habits complete ROS otherwise negative     Objective:   Physical Exam  Alert vitals stable, NAD. Blood pressure good on repeat. HEENT normal. Lungs clear. Heart  regular rate and rhythm. Ankles trace edema pulses good sensation intact      Assessment & Plan:  Impression 1 type 2 diabetes overall good control discussed maintain same #2 hypertension good control discussed maintain same meds #3 hyperlipidemia status uncertain discuss we'll check #4 restless leg syndrome ongoing #5 depression has worsened somewhat. States feels he needs stronger dose than the 20 mg Lexapro. #6 reflux ongoing with current need for medications #7 hip pain discuss plan appropriate blood work. Pain medication refill. Diet exercise discussed. Increase Lexapro rationale discussed. Decrease metformin rationale discussed. 40 minutes spent most in discussion of this extremely complicated but nice gentleman

## 2016-07-17 ENCOUNTER — Other Ambulatory Visit: Payer: Self-pay | Admitting: Family Medicine

## 2016-08-18 ENCOUNTER — Other Ambulatory Visit: Payer: Self-pay | Admitting: Family Medicine

## 2016-08-20 NOTE — Progress Notes (Signed)
REVIEWED-NO ADDITIONAL RECOMMENDATIONS. 

## 2016-09-13 ENCOUNTER — Ambulatory Visit (INDEPENDENT_AMBULATORY_CARE_PROVIDER_SITE_OTHER): Payer: 59 | Admitting: Family Medicine

## 2016-09-13 VITALS — BP 138/90 | Temp 98.6°F | Wt 227.6 lb

## 2016-09-13 DIAGNOSIS — R6889 Other general symptoms and signs: Secondary | ICD-10-CM

## 2016-09-13 DIAGNOSIS — J209 Acute bronchitis, unspecified: Secondary | ICD-10-CM

## 2016-09-13 MED ORDER — BENZONATATE 100 MG PO CAPS: 100.0000 mg | ORAL_CAPSULE | Freq: Three times a day (TID) | ORAL | 0 refills | Status: DC | PRN

## 2016-09-13 MED ORDER — AMOXICILLIN-POT CLAVULANATE 875-125 MG PO TABS: 1.0000 | ORAL_TABLET | Freq: Two times a day (BID) | ORAL | 0 refills | Status: DC

## 2016-09-13 NOTE — Progress Notes (Signed)
   Subjective:    Patient ID: Shaun [name], male    DOB: April 28, 1956, 61 y.o.   MRN: [id number]  HPI Patient presents today c/o cough, fever (<100-101 this am), and nasal congestion x 1 week. He state symptoms have worsened over the past week. He states Corecedin has not helped. Patient denies wheezing shortness of breath. Relates a lot of head congestion sinus pressure denies high fever sweats currently states he did have some low-grade fever the past couple days. Some body aches with and fatigue and tiredness Review of Systems Relates cough head congestion sinus pressure relates some sweats and low-grade fevers denies vomiting diarrhea    Objective:   Physical Exam  No crackles no respiratory distress HEENT is benign neck no masses heart regular      Assessment & Plan:  Viral syndrome Secondary rhinosinusitis Possible flulike illness Progressive measures discussed in detail. If he has ongoing troubles or worsening illness he needs a follow-up immediately here or ER

## 2016-09-16 ENCOUNTER — Ambulatory Visit: Payer: 59 | Admitting: Family Medicine

## 2016-09-16 ENCOUNTER — Other Ambulatory Visit: Payer: Self-pay | Admitting: Family Medicine

## 2016-09-16 MED ORDER — HYDROCODONE-ACETAMINOPHEN 5-325 MG PO TABS: 0.5000 | ORAL_TABLET | Freq: Three times a day (TID) | ORAL | 0 refills | Status: DC | PRN

## 2016-09-16 NOTE — Telephone Encounter (Signed)
Ok times one 

## 2016-09-16 NOTE — Telephone Encounter (Signed)
Prescription upfront for pick up. Patient notified. 

## 2016-09-16 NOTE — Telephone Encounter (Signed)
Patient requesting refill on hydrocodone has appointment 2/2 for [facility]

## 2016-09-24 ENCOUNTER — Other Ambulatory Visit: Payer: Self-pay | Admitting: Family Medicine

## 2016-09-30 ENCOUNTER — Ambulatory Visit: Payer: 59 | Admitting: Family Medicine

## 2016-10-06 ENCOUNTER — Encounter: Payer: Self-pay | Admitting: Family Medicine

## 2016-10-06 ENCOUNTER — Ambulatory Visit (INDEPENDENT_AMBULATORY_CARE_PROVIDER_SITE_OTHER): Payer: 59 | Admitting: Family Medicine

## 2016-10-06 VITALS — BP 122/82 | Ht 68.0 in | Wt 226.6 lb

## 2016-10-06 DIAGNOSIS — I10 Essential (primary) hypertension: Secondary | ICD-10-CM

## 2016-10-06 DIAGNOSIS — E119 Type 2 diabetes mellitus without complications: Secondary | ICD-10-CM

## 2016-10-06 DIAGNOSIS — E785 Hyperlipidemia, unspecified: Secondary | ICD-10-CM

## 2016-10-06 DIAGNOSIS — M1611 Unilateral primary osteoarthritis, right hip: Secondary | ICD-10-CM | POA: Diagnosis not present

## 2016-10-06 MED ORDER — HYDROCODONE-ACETAMINOPHEN 5-325 MG PO TABS: 0.5000 | ORAL_TABLET | Freq: Four times a day (QID) | ORAL | 0 refills | Status: DC | PRN

## 2016-10-06 NOTE — Progress Notes (Signed)
   Subjective:    Patient ID: Shaun [name], male    DOB: [birth year], 61 y.o.   MRN: [id number]  Diabetes  He presents for his follow-up diabetic visit. He has type 2 diabetes mellitus. Risk factors for coronary artery disease include dyslipidemia, diabetes mellitus and hypertension. Current diabetic treatment includes oral agent (monotherapy). He is compliant with treatment all of the time. His weight is stable. He is following a diabetic diet. He has not had a previous visit with a dietitian. He does not see a podiatrist.Eye exam is not current.   Results for orders placed or performed in visit on 06/10/16  POCT HgB A1C  Result Value Ref Range   Hemoglobin A1C 5.5     Patient claims compliance with diabetes medication. No obvious side effects. Reports no substantial low sugar spells. Most numbers are generally in good range when checked fasting. Generally does not miss a dose of medication. Watching diabetic diet closely  Patient notes ongoing compliance with antidepressant medication. No obvious side effects. Reports does not miss a dose. Overall continues to help depression substantially. No thoughts of homicide or suicide. Would like to maintain medication.  Blood pressure medicine and blood pressure levels reviewed today with patient. Compliant with blood pressure medicine. States does not miss a dose. No obvious side effects. Blood pressure generally good when checked elsewhere. Watching salt intake.  Patient continues to take lipid medication regularly. No obvious side effects from it. Generally does not miss a dose. Prior blood work results are reviewed with patient. Patient continues to work on fat intake in diet  [id number] systolic most days  Good amnt of exercise  Chronic pain meds   Review of Systems No headache, no major weight loss or weight gain, no chest pain no back pain abdominal pain no change in bowel habits complete ROS otherwise negative     Objective:   Physical  Exam  Alert vitals stable, NAD. Blood pressure good on repeat. HEENT normal. Lungs clear. Heart regular rate and rhythm.       Assessment & Plan:  Impression 1 type 2 diabetes glucose better no low spells. #2 hypertension good control discussed maintain same meds #3 hyperlipidemia prior blood work reviewed needs further blood work discussed maintain same pending #4 chronic pain suboptimum patient is a 7 positive troubles #5 depression clinically stable handling meds well plan increase hydrocodone as noted one half 4 times a day. Follow-up in one month to delve into this appropriate blood work. Maintain other medications diet exercise discussed WSL

## 2016-10-13 ENCOUNTER — Other Ambulatory Visit: Payer: Self-pay | Admitting: Adult Health

## 2016-10-18 ENCOUNTER — Emergency Department (HOSPITAL_COMMUNITY)
Admission: EM | Admit: 2016-10-18 | Discharge: 2016-10-18 | Disposition: A | Discharge status: Home / Self Care | Payer: 59 | Attending: Emergency Medicine | Admitting: Emergency Medicine

## 2016-10-18 ENCOUNTER — Encounter: Payer: Self-pay | Admitting: Family Medicine

## 2016-10-18 ENCOUNTER — Ambulatory Visit (INDEPENDENT_AMBULATORY_CARE_PROVIDER_SITE_OTHER): Payer: 59 | Admitting: Family Medicine

## 2016-10-18 ENCOUNTER — Other Ambulatory Visit: Payer: Self-pay

## 2016-10-18 ENCOUNTER — Encounter (HOSPITAL_COMMUNITY): Payer: Self-pay | Admitting: Emergency Medicine

## 2016-10-18 ENCOUNTER — Emergency Department (HOSPITAL_COMMUNITY): Payer: 59

## 2016-10-18 VITALS — BP 124/84 | Temp 98.2°F | Ht 68.0 in | Wt 227.1 lb

## 2016-10-18 DIAGNOSIS — Z87891 Personal history of nicotine dependence: Secondary | ICD-10-CM | POA: Diagnosis not present

## 2016-10-18 DIAGNOSIS — R51 Headache: Secondary | ICD-10-CM | POA: Diagnosis present

## 2016-10-18 DIAGNOSIS — R42 Dizziness and giddiness: Secondary | ICD-10-CM

## 2016-10-18 DIAGNOSIS — Z7984 Long term (current) use of oral hypoglycemic drugs: Secondary | ICD-10-CM | POA: Diagnosis not present

## 2016-10-18 DIAGNOSIS — Z79899 Other long term (current) drug therapy: Secondary | ICD-10-CM | POA: Diagnosis not present

## 2016-10-18 DIAGNOSIS — R269 Unspecified abnormalities of gait and mobility: Secondary | ICD-10-CM

## 2016-10-18 DIAGNOSIS — I509 Heart failure, unspecified: Secondary | ICD-10-CM | POA: Insufficient documentation

## 2016-10-18 DIAGNOSIS — J45909 Unspecified asthma, uncomplicated: Secondary | ICD-10-CM | POA: Diagnosis not present

## 2016-10-18 DIAGNOSIS — E119 Type 2 diabetes mellitus without complications: Secondary | ICD-10-CM | POA: Insufficient documentation

## 2016-10-18 DIAGNOSIS — I110 Hypertensive heart disease with heart failure: Secondary | ICD-10-CM | POA: Diagnosis not present

## 2016-10-18 DIAGNOSIS — I2510 Atherosclerotic heart disease of native coronary artery without angina pectoris: Secondary | ICD-10-CM | POA: Diagnosis not present

## 2016-10-18 LAB — COMPREHENSIVE METABOLIC PANEL
ALT: 22 U/L (ref 17–63)
AST: 22 U/L (ref 15–41)
Albumin: 4 g/dL (ref 3.5–5.0)
Alkaline Phosphatase: 88 U/L (ref 38–126)
Anion gap: 7 (ref 5–15)
BUN: 15 mg/dL (ref 6–20)
CO2: 28 mmol/L (ref 22–32)
Calcium: 9 mg/dL (ref 8.9–10.3)
Chloride: 105 mmol/L (ref 101–111)
Creatinine, Ser: 0.9 mg/dL (ref 0.61–1.24)
GFR calc Af Amer: >60 mL/min (ref >60)
GFR calc non Af Amer: >60 mL/min (ref >60)
Glucose, Bld: 117 mg/dL — ABNORMAL HIGH (ref 65–99)
Potassium: 3.6 mmol/L (ref 3.5–5.1)
Sodium: 140 mmol/L (ref 135–145)
Total Bilirubin: 0.5 mg/dL (ref 0.3–1.2)
Total Protein: 7 g/dL (ref 6.5–8.1)

## 2016-10-18 LAB — URINALYSIS, ROUTINE W REFLEX MICROSCOPIC
BILIRUBIN URINE: NEGATIVE
Glucose, UA: NEGATIVE mg/dL
HGB URINE DIPSTICK: NEGATIVE
KETONES UR: NEGATIVE mg/dL
Leukocytes, UA: NEGATIVE
NITRITE: NEGATIVE
PH: 6 (ref 5.0–8.0)
Protein, ur: NEGATIVE mg/dL
Specific Gravity, Urine: 1.009 (ref 1.005–1.030)

## 2016-10-18 LAB — I-STAT CHEM 8, ED
BUN: 15 mg/dL (ref 6–20)
CHLORIDE: 102 mmol/L (ref 101–111)
Calcium, Ion: 1.09 mmol/L — ABNORMAL LOW (ref 1.15–1.40)
Creatinine, Ser: 1 mg/dL (ref 0.61–1.24)
Glucose, Bld: 115 mg/dL — ABNORMAL HIGH (ref 65–99)
HEMATOCRIT: 37 % — AB (ref 39.0–52.0)
Hemoglobin: 12.6 g/dL — ABNORMAL LOW (ref 13.0–17.0)
Potassium: 3.6 mmol/L (ref 3.5–5.1)
SODIUM: 143 mmol/L (ref 135–145)
TCO2: 25 mmol/L (ref 0–100)

## 2016-10-18 LAB — CBC
HCT: 37.2 % — ABNORMAL LOW (ref 39.0–52.0)
Hemoglobin: 11.9 g/dL — ABNORMAL LOW (ref 13.0–17.0)
MCH: 23.8 pg — ABNORMAL LOW (ref 26.0–34.0)
MCHC: 32 g/dL (ref 30.0–36.0)
MCV: 74.4 fL — ABNORMAL LOW (ref 78.0–100.0)
Platelets: 178 10*3/uL (ref 150–400)
RBC: 5 MIL/uL (ref 4.22–5.81)
RDW: 17.6 % — ABNORMAL HIGH (ref 11.5–15.5)
WBC: 9.7 10*3/uL (ref 4.0–10.5)

## 2016-10-18 LAB — DIFFERENTIAL
Basophils Absolute: 0 10*3/uL (ref 0.0–0.1)
Basophils Relative: 0 %
Eosinophils Absolute: 0.2 10*3/uL (ref 0.0–0.7)
Eosinophils Relative: 2 %
Lymphocytes Relative: 25 %
Lymphs Abs: 2.4 10*3/uL (ref 0.7–4.0)
Monocytes Absolute: 0.8 10*3/uL (ref 0.1–1.0)
Monocytes Relative: 9 %
Neutro Abs: 6.3 10*3/uL (ref 1.7–7.7)
Neutrophils Relative %: 64 %

## 2016-10-18 LAB — RAPID URINE DRUG SCREEN, HOSP PERFORMED
Amphetamines: NOT DETECTED
Barbiturates: NOT DETECTED
Benzodiazepines: NOT DETECTED
Cocaine: NOT DETECTED
Opiates: POSITIVE — AB
Tetrahydrocannabinol: NOT DETECTED

## 2016-10-18 LAB — PROTIME-INR
INR: 0.95
PROTHROMBIN TIME: 12.7 s (ref 11.4–15.2)

## 2016-10-18 LAB — I-STAT TROPONIN, ED: Troponin i, poc: 0 ng/mL (ref 0.00–0.08)

## 2016-10-18 LAB — ETHANOL: Alcohol, Ethyl (B): <5 mg/dL (ref <5)

## 2016-10-18 LAB — CBG MONITORING, ED: Glucose-Capillary: 128 mg/dL — ABNORMAL HIGH (ref 65–99)

## 2016-10-18 LAB — APTT: aPTT: 28 seconds (ref 24–36)

## 2016-10-18 MED ORDER — MECLIZINE HCL 25 MG PO TABS: 25.0000 mg | ORAL_TABLET | Freq: Three times a day (TID) | ORAL | 0 refills | Status: DC | PRN

## 2016-10-18 NOTE — Discharge Instructions (Signed)
Get plenty of rest, and drink a lot of fluids.  Used the  prescription medicine if needed, to help you with dizziness.

## 2016-10-18 NOTE — ED Provider Notes (Addendum)
[facility] DEPT Provider Note   CSN: [id number] Arrival date & time: 10/18/16  1652     History   Chief Complaint Chief Complaint  Patient presents with  . Blurred Vision  . Weakness    L side    HPI Shaun [name] is a 61 y.o. male.  He presents for evaluation of headache for several days, following which he developed blurred vision and dizziness upon awakening this morning at around 5 AM.  He tried to drive to work but felt bad, so went to see his doctor.  His doctor was concerned about the possibility of a CVA, so sent him here for evaluation.  Family members state that the patient has slurred speech, different from baseline.  Patient states that when he walks he feels like he drifts to the left.  His blurred vision, is mild.  He denies diplopia.  No prior similar problems.  No recent changes of medications.  He works as a Banker.  No prior similar symptoms.  He does not smoke cigarettes.  He works as a Banker.  There are no other known modifying factors.    HPI  Past Medical History:  Diagnosis Date  . Arteriosclerotic cardiovascular disease (ASCVD)    Nonobstructive; cath in 2000: 30-40% mid LAD and proximal RCA;normal EF. stress nuclear in 2006-subtle inferoseptal hypoperfusion with reversibility; negative stress EKG; good exercise tolerance  . Asthma   . CHF (congestive heart failure) ([place])   . Colon polyps    30 colon polyps found on first colonoscopy  . Diabetes mellitus without complication ([place])   . Hyperlipidemia   . Hypertension   . Insomnia   . Obstructive sleep apnea 12/2009   01/26/2010 AHI 83/hr  . Paroxysmal atrial fibrillation ([place]) 10/2004   Onset in 10/2004; recurred 09/2008  . PUD (peptic ulcer disease)    1980s  . RLS (restless legs syndrome)   . Sinusitis     Patient Active Problem List   Diagnosis Date Noted  . Depression 09/24/2015  . COPD (chronic obstructive pulmonary disease) ([place]) 07/20/2015  . Unstable  angina ([place]) 07/19/2015  . Acute chest pain 07/19/2015  . Genetic testing 09/09/2014  . History of colonic polyps 08/11/2014  . Colon polyps   . Encounter for screening colonoscopy 04/30/2014  . Chest pain 08/30/2013  . Diabetes mellitus ([place]) 02/09/2011  . Arteriosclerotic cardiovascular disease (ASCVD)   . Tobacco abuse, in remission   . Hypertension   . Hyperlipidemia LDL goal <100 02/23/2010  . Atrial fibrillation ([place]) 02/23/2010  . Obstructive sleep apnea 12/27/2009    Past Surgical History:  Procedure Laterality Date  . CARDIAC CATHETERIZATION N/A 07/21/2015   Procedure: Left Heart Cath and Coronary Angiography;  Surgeon: Peter M Martinique, MD;  Location: [facility];  Service: Cardiovascular;  Laterality: N/A;  . CARDIAC CATHETERIZATION N/A 07/21/2015   Procedure: Coronary Stent Intervention;  Surgeon: Peter M Martinique, MD;  Location: [facility];  Service: Cardiovascular;  Laterality: N/A;  . COLONOSCOPY N/A 05/19/2014   Dr. Barnie Alderman diverticulosis/moderate external hemorrhoids, >20 simple adenomas. Genetic screening negative.   Marland Kitchen HERNIA REPAIR  1986   Left inguinal  . ROTATOR CUFF REPAIR     Right       Home Medications    Prior to Admission medications   Medication Sig Start Date End Date Taking? Authorizing Provider  atorvastatin (LIPITOR) 80 MG tablet TAKE 1 TABLET BY MOUTH DAILY 09/26/16  Yes Mikey Kirschner, MD  ELIQUIS 5 MG TABS tablet TAKE 1 TABLET BY MOUTH TWICE DAILY 09/21/15  Yes Lendon Colonel, NP  escitalopram (LEXAPRO) 20 MG tablet Take 1.5 tablets by mouth daily 06/10/16  Yes Mikey Kirschner, MD  HYDROcodone-acetaminophen (NORCO/VICODIN) 5-325 MG tablet Take 0.5 tablets by mouth 4 (four) times daily as needed. Patient taking differently: Take 0.5 tablets by mouth 4 (four) times daily as needed for moderate pain or severe pain.  10/06/16  Yes Mikey Kirschner, MD  lisinopril (PRINIVIL,ZESTRIL) 5 MG tablet TAKE 1 TABLET BY MOUTH DAILY  04/20/16  Yes Arnoldo Lenis, MD  metFORMIN (GLUCOPHAGE) 500 MG tablet TAKE 2 TABLETS BY MOUTH TWICE DAILY WITH A MEAL 09/16/15  Yes Mikey Kirschner, MD  metoprolol (LOPRESSOR) 50 MG tablet Take 1.5 tablets (75 mg total) by mouth 2 (two) times daily. 12/03/15  Yes Arnoldo Lenis, MD  nitroGLYCERIN (NITROSTAT) 0.4 MG SL tablet Place 1 tablet (0.4 mg total) under the tongue every 5 (five) minutes as needed. Patient taking differently: Place 0.4 mg under the tongue every 5 (five) minutes as needed for chest pain.  06/30/15  Yes Lendon Colonel, NP  Omega-3 Fatty Acids (FISH OIL PO) Take 1 capsule by mouth daily.    Yes Historical Provider, MD  pantoprazole (PROTONIX) 40 MG tablet TAKE 1 TABLET BY MOUTH DAILY 09/16/16  Yes Mikey Kirschner, MD  rOPINIRole (REQUIP) 2 MG tablet TAKE 1 TABLET BY MOUTH EVERY NIGHT AT BEDTIME 05/31/16  Yes Mikey Kirschner, MD  sodium chloride (OCEAN) 0.65 % SOLN nasal spray Place 1 spray into both nostrils as needed for congestion.   Yes Historical Provider, MD  traZODone (DESYREL) 50 MG tablet TAKE 2 TABLETS(100 MG) BY MOUTH AT BEDTIME 09/16/16  Yes Mikey Kirschner, MD  triamcinolone cream (KENALOG) 0.1 % Apply 1 application topically 2 (two) times daily. Patient taking differently: Apply 1 application topically daily as needed (for irritation).  06/30/14  Yes Mikey Kirschner, MD  VIAGRA 100 MG tablet TAKE 1 TABLET BY MOUTH EVERY DAY AS NEEDED FOR ERECTILE DYSFUNCTION 10/30/15  Yes Lendon Colonel, NP  meclizine (ANTIVERT) 25 MG tablet Take 1 tablet (25 mg total) by mouth 3 (three) times daily as needed for dizziness. 10/18/16   Daleen Bo, MD    Family History Family History  Problem Relation Age of Onset  . Hypertension Mother   . Breast cancer Mother 21  . Heart attack Father   . Diabetes Brother   . Skin cancer Sister 11  . Brain cancer Maternal Uncle   . Cancer Maternal Uncle     NOS  . Breast cancer Cousin     maternal cousin dx <50  . Colon cancer  Neg Hx     Social History Social History  Substance Use Topics  . Smoking status: Former Smoker    Packs/day: 1.00    Years: 32.00    Types: Cigarettes    Start date: 07/24/1970    Quit date: 07/04/2013  . Smokeless tobacco: Never Used  . Alcohol use No     Allergies   Patient has no known allergies.   Review of Systems Review of Systems  All other systems reviewed and are negative.    Physical Exam Updated Vital Signs BP (!) 153/109   Pulse 82   Temp 98.5 F (36.9 C)   Resp 17   Ht 5\' 8"  (1.727 m)   Wt 227 lb (103 kg)   SpO2 96%  BMI 34.52 kg/m   Physical Exam  Constitutional: He is oriented to person, place, and time. He appears well-developed and well-nourished.  HENT:  Head: Normocephalic and atraumatic.  Right Ear: External ear normal.  Left Ear: External ear normal.  Eyes: Conjunctivae and EOM are normal. Pupils are equal, round, and reactive to light.  Neck: Normal range of motion and phonation normal. Neck supple.  Cardiovascular: Normal rate, regular rhythm and normal heart sounds.   Pulmonary/Chest: Effort normal and breath sounds normal. He exhibits no bony tenderness.  Abdominal: Soft. There is no tenderness.  Musculoskeletal: Normal range of motion.  Normal strength arms and legs bilaterally.  Neurological: He is alert and oriented to person, place, and time. No cranial nerve deficit or sensory deficit. He exhibits normal muscle tone. Coordination normal.  No dysarthria or aphasia or nystagmus.  Skin: Skin is warm, dry and intact.  Psychiatric: He has a normal mood and affect. His behavior is normal. Judgment and thought content normal.  Nursing note and vitals reviewed.    ED Treatments / Results  Labs (all labs ordered are listed, but only abnormal results are displayed) Labs Reviewed  CBC - Abnormal; Notable for the following:       Result Value   Hemoglobin 11.9 (*)    HCT 37.2 (*)    MCV 74.4 (*)    MCH 23.8 (*)    RDW 17.6 (*)     All other components within normal limits  COMPREHENSIVE METABOLIC PANEL - Abnormal; Notable for the following:    Glucose, Bld 117 (*)    All other components within normal limits  RAPID URINE DRUG SCREEN, HOSP PERFORMED - Abnormal; Notable for the following:    Opiates POSITIVE (*)    All other components within normal limits  I-STAT CHEM 8, ED - Abnormal; Notable for the following:    Glucose, Bld 115 (*)    Calcium, Ion 1.09 (*)    Hemoglobin 12.6 (*)    HCT 37.0 (*)    All other components within normal limits  CBG MONITORING, ED - Abnormal; Notable for the following:    Glucose-Capillary 128 (*)    All other components within normal limits  ETHANOL  PROTIME-INR  APTT  DIFFERENTIAL  URINALYSIS, ROUTINE W REFLEX MICROSCOPIC  I-STAT TROPOININ, ED    EKG  EKG Interpretation None        Date: 10/18/16  Rate: 96  Rhythm: atrial fibrillation  QRS Axis: normal  PR and QT Intervals: QT normal  ST/T Wave abnormalities: normal  PR and QRS Conduction Disutrbances:none  Narrative Interpretation:   Old EKG Reviewed: unchanged   Radiology Ct Head Wo Contrast  Result Date: 10/18/2016 CLINICAL DATA:  Headache. Blurred vision and left-sided weakness with slurred speech since this morning. EXAM: CT HEAD WITHOUT CONTRAST TECHNIQUE: Contiguous axial images were obtained from the base of the skull through the vertex without intravenous contrast. COMPARISON:  None. FINDINGS: Brain: No evidence of acute infarction, hemorrhage, hydrocephalus, extra-axial collection or mass lesion/mass effect. Vascular: No hyperdense vessel or unexpected calcification. Skull: Normal. Negative for fracture or focal lesion. Sinuses/Orbits: No acute finding. Other: None. IMPRESSION: No acute intracranial abnormality. Electronically Signed   By: Fidela Salisbury M.D.   On: 10/18/2016 18:12   Mr Brain Wo Contrast  Result Date: 10/18/2016 CLINICAL DATA:  Headaches, blurry vision and dizziness. History of  atrial fibrillation, hyperlipidemia. EXAM: MRI HEAD WITHOUT CONTRAST TECHNIQUE: Multiplanar, multiecho pulse sequences of the brain and surrounding structures were obtained  without intravenous contrast. COMPARISON:  CT HEAD October 26, 2016 at 5:54 p.m. FINDINGS: BRAIN: No reduced diffusion to suggest acute ischemia. No susceptibility artifact to suggest hemorrhage. The ventricles and sulci are normal for patient's age. No suspicious parenchymal signal, masses or mass effect. No abnormal extra-axial fluid collections. LEFT inferior basal ganglia perivascular space. VASCULAR: Normal major intracranial vascular flow voids present at skull base. SKULL AND UPPER CERVICAL SPINE: No abnormal sellar expansion. No suspicious calvarial bone marrow signal. Craniocervical junction maintained. SINUSES/ORBITS: Trace paranasal sinus mucosal thickening. Mastoid air cells are well aerated. The included ocular globes and orbital contents are non-suspicious. Small RIGHT periorbital lipoma. OTHER: None. IMPRESSION: Normal noncontrast MRI of the head for age. Electronically Signed   By: Elon Alas M.D.   On: 10/18/2016 18:25    Procedures Procedures (including critical care time)  Medications Ordered in ED Medications - No data to display   Initial Impression / Assessment and Plan / ED Course  I have reviewed the triage vital signs and the nursing notes.  Pertinent labs & imaging results that were available during my care of the patient were reviewed by me and considered in my medical decision making (see chart for details).  Clinical Course as of Oct 18 2345  Tue Oct 18, 2016  1955 Ambulation trial-he was able to ambulate independently but reported that he was feeling like he was drifting to the left, but was not ataxic.  [EW]    Clinical Course User Index [EW] Daleen Bo, MD    Medications - No data to display  Patient Vitals for the past 24 hrs:  BP Temp Temp src Pulse Resp SpO2 Height Weight    10/18/16 2000 (!) 153/109 - - 82 17 96 % - -  10/18/16 1934 - - - - - - 5\' 8"  (1.727 m) 227 lb (103 kg)  10/18/16 1930 149/88 - - 83 13 96 % - -  10/18/16 1900 144/92 - - 91 13 95 % - -  10/18/16 1832 144/95 - - - - - - -  10/18/16 1730 - 98.5 F (36.9 C) - - - - - -  10/18/16 1730 142/96 - - 91 17 95 % - -  10/18/16 1714 146/91 - - - 18 - - -  10/18/16 1659 - - - - - - - 227 lb (103 kg)  10/18/16 1657 161/97 98.5 F (36.9 C) Oral 105 18 97 % - -      8:10 PM Reevaluation with update and discussion. After initial assessment and treatment, an updated evaluation reveals no change in clinical status.  Findings discussed with the patient and all questions were answered. Kesley Mullens L    Final Clinical Impressions(s) / ED Diagnoses   Final diagnoses:  Dizziness     Nonspecific dizziness, and headache.  Most likely peripheral vertigo.  Mild disability at this time.  Doubt CVA, ACS, PE or pneumonia.  Nursing Notes Reviewed/ Care Coordinated Applicable Imaging Reviewed Interpretation of Laboratory Data incorporated into ED treatment  The patient appears reasonably screened and/or stabilized for discharge and I doubt any other medical condition or other [facility] requiring further screening, evaluation, or treatment in the ED at this time prior to discharge.  Plan: Home Medications-continue usual medications; Home Treatments-rest, fluids; return here if the recommended treatment, does not improve the symptoms; Recommended follow up-PCP if not better in 2 or 3 days       New Prescriptions Discharge Medication List as of 10/18/2016  8:05 PM  START taking these medications   Details  meclizine (ANTIVERT) 25 MG tablet Take 1 tablet (25 mg total) by mouth 3 (three) times daily as needed for dizziness., Starting Tue 10/18/2016, Print         Daleen Bo, MD 10/18/16 2013    Daleen Bo, MD 10/18/16 [phone]

## 2016-10-18 NOTE — ED Notes (Signed)
Ambulated pt around nurses station. Pt had an unsteady gait and stated he felt he was "drifting over to the left" as he walked.

## 2016-10-18 NOTE — ED Triage Notes (Signed)
Pt has had HA since Saturday, went to sleep at 2200 last night, woke this morning at 0500 with blurred vision and states he feels like he drifts to the L. Family at bedside states he sounds like he has slurred speech. L sided weakness, does not feel touch to L side, vision deficit to L side.

## 2016-10-18 NOTE — ED Notes (Signed)
Electronic signature pad not working, patient agree to discharge orders.

## 2016-10-18 NOTE — ED Notes (Addendum)
Pt state he now remembers coughing after breakfast this morning, MD aware, pt and family aware of NPO

## 2016-10-18 NOTE — Progress Notes (Signed)
   Subjective:    Patient ID: Shaun [name], male    DOB: 1955/09/29, 61 y.o.   MRN: [id number]  Dizziness  This is a new problem. The current episode started in the past 7 days. The problem occurs intermittently. The problem has been unchanged. Associated symptoms include headaches. Associated symptoms comments: Runny nose. Nothing aggravates the symptoms. He has tried nothing for the symptoms. The treatment provided no relief.   Patient has no other concerns at this time.   Sat startd bumping into thngs  Pt notes pulling to the left   Notes trouble with equilibrium after standiing  Trouble walking, then wobbles after walking  Some spinning at times  No queazziness  Some headache ,    No hx inner ear problems    Patient has numerous risk factors for stroke, on chronic anticoagulation, also with chronic atrial fibrillation    Review of Systems  Neurological: Positive for dizziness and headaches.       Objective:   Physical Exam Slightly Cloudy sensorium compared to baseline Oriented 3 Pos cerebellar dfindings with finger to nose, somewhat unsteady  Unsteady with walking with observation of gait Alert vitals stable, NAD. Blood pressure good on repeat. HEENT normal. Lungs clear. Heart control rate and irregular rhythm.       Assessment & Plan:  Acute onset unsteadiness, and trouble walking with arial fibrillation and chronic eliquis and plavix, suspect potential ceerebral hemorrhage vs ischemic stroke. Long discussion with patient. Also I spoke with emergency room. On to ER for urgent workup

## 2016-10-27 DIAGNOSIS — E119 Type 2 diabetes mellitus without complications: Secondary | ICD-10-CM | POA: Diagnosis not present

## 2016-10-27 DIAGNOSIS — I10 Essential (primary) hypertension: Secondary | ICD-10-CM | POA: Diagnosis not present

## 2016-10-27 DIAGNOSIS — E785 Hyperlipidemia, unspecified: Secondary | ICD-10-CM | POA: Diagnosis not present

## 2016-10-28 LAB — LIPID PANEL
CHOLESTEROL TOTAL: 138 mg/dL (ref 100–199)
Chol/HDL Ratio: 4.8 ratio units (ref 0.0–5.0)
HDL: 29 mg/dL — AB (ref >39)
LDL CALC: 69 mg/dL (ref 0–99)
Triglycerides: 199 mg/dL — ABNORMAL HIGH (ref 0–149)
VLDL CHOLESTEROL CAL: 40 mg/dL (ref 5–40)

## 2016-10-28 LAB — BASIC METABOLIC PANEL
BUN/Creatinine Ratio: 18 (ref 10–24)
BUN: 17 mg/dL (ref 8–27)
CALCIUM: 8.6 mg/dL (ref 8.6–10.2)
CHLORIDE: 100 mmol/L (ref 96–106)
CO2: 24 mmol/L (ref 18–29)
Creatinine, Ser: 0.96 mg/dL (ref 0.76–1.27)
GFR calc Af Amer: 99 mL/min/{1.73_m2} (ref >59)
GFR calc non Af Amer: 86 mL/min/{1.73_m2} (ref >59)
GLUCOSE: 105 mg/dL — AB (ref 65–99)
POTASSIUM: 4.3 mmol/L (ref 3.5–5.2)
SODIUM: 142 mmol/L (ref 134–144)

## 2016-10-28 LAB — HEPATIC FUNCTION PANEL
ALT: 23 IU/L (ref 0–44)
AST: 19 IU/L (ref 0–40)
Albumin: 4.3 g/dL (ref 3.6–4.8)
Alkaline Phosphatase: 98 IU/L (ref 39–117)
Bilirubin Total: 0.6 mg/dL (ref 0.0–1.2)
Bilirubin, Direct: 0.15 mg/dL (ref 0.00–0.40)
Total Protein: 6.5 g/dL (ref 6.0–8.5)

## 2016-10-28 LAB — PSA: Prostate Specific Ag, Serum: 0.5 ng/mL (ref 0.0–4.0)

## 2016-10-28 LAB — MICROALBUMIN / CREATININE URINE RATIO
CREATININE, UR: 110.4 mg/dL
MICROALBUM., U, RANDOM: 6.1 ug/mL
Microalb/Creat Ratio: 5.5 mg/g creat (ref 0.0–30.0)

## 2016-10-28 LAB — HGB A1C W/O EAG: HEMOGLOBIN A1C: 6.3 % — AB (ref 4.8–5.6)

## 2016-11-03 ENCOUNTER — Ambulatory Visit (INDEPENDENT_AMBULATORY_CARE_PROVIDER_SITE_OTHER): Payer: 59 | Admitting: Family Medicine

## 2016-11-03 DIAGNOSIS — M1611 Unilateral primary osteoarthritis, right hip: Secondary | ICD-10-CM

## 2016-11-03 DIAGNOSIS — M7061 Trochanteric bursitis, right hip: Secondary | ICD-10-CM

## 2016-11-03 DIAGNOSIS — R42 Dizziness and giddiness: Secondary | ICD-10-CM

## 2016-11-03 MED ORDER — HYDROCODONE-ACETAMINOPHEN 5-325 MG PO TABS: 0.5000 | ORAL_TABLET | Freq: Four times a day (QID) | ORAL | 0 refills | Status: DC | PRN

## 2016-11-03 NOTE — Progress Notes (Signed)
120 Subjective:     Patient ID: Shaun [name], male   DOB: 1956-05-27, 62 y.o.   MRN: [id number]  HPI Patient also presents stating, "I'm not sure what happened when I went into emergency room a couple weeks ago" testing patient reports dizziness lasted several days. Also unsteadiness. Worse with certain changes of motion. Had never had this before.   sTONG FAM HX OF JOINT PAIN.RIGHT HIP RADIATS TO THE RIGHT GROIN. STIFF AND PAINFUL RIGHT OFF THE BAT. 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 Pain seems s a little   Takes a half of narcotic tab up to four times per day  No hx of trouble with advil or aleave  Tries to stay active withexercise and such  Left hip painfu at times   Day number [id number] Review of Systems No headache, no major weight loss or weight gain, no chest pain no back pain abdominal pain no change in bowel habits complete ROS otherwise negative     Objective:   Physical Exam Alert vital stable HEENT normal neurological exam negative cerebellar function testing. Normal fine motor testing reflexes intact. Lungs clear. Heart rare rhythm right hip positive pain with external rotation. Positive lateral trochanteric tenderness to deep palpation left hip good range of motion minimal pain    Assessment:Greater than 50% of this 25 minute visit was spent in counseling and discussion and coordination of care regarding the above diagnosis/diagnoses        Greater than 50% of this 25 minute face to face visit was spent in counseling and discussion and coordination of care regarding the above diagnosis/diagnosies  Plan:     Impression 1 workup for potential stroke ended up being likely in her ear. Discussed at great length. All results discussed ER report discussed subsequent recovery discussed #2 right hip pain progressive with element of both of arthritis and trochanteric bursitis. Based on this. Recommend orthopedic referral. Long discussion held  in this regard.  Greater than 50% of this 25 minute face to face visit was spent in counseling and discussion and coordination of care regarding the above diagnosis/diagnosies

## 2016-11-03 NOTE — Patient Instructions (Signed)
Trochanteric Bursitis Trochanteric bursitis is a condition that causes hip pain. Trochanteric bursitis happens when fluid-filled sacs (bursae) in the hip get irritated. Normally these sacs absorb shock and help strong bands of tissue (tendons) in your hip glide smoothly over each other and over your hip bones. What are the causes? This condition results from increased friction between the hip bones and the tendons that go over them. This condition can happen if you:  Have weak hips.  Use your hip muscles too much (overuse).  Get hit in the hip. What increases the risk? This condition is more likely to develop in:  Women.  Adults who are middle-aged or older.  People with arthritis or a spinal condition.  People with weak buttocks muscles (gluteal muscles).  People who have one leg that is shorter than the other.  People who participate in certain kinds of athletic activities, such as:  Running sports, especially long-distance running.  Contact sports, like football or martial arts.  Sports in which falls may occur, like skiing. What are the signs or symptoms? The main symptom of this condition is pain and tenderness over the point of your hip. The pain may be:  Sharp and intense.  Dull and achy.  Felt on the outside of your thigh. It may increase when you:  Lie on your side.  Walk or run.  Go up on stairs.  Sit.  Stand up after sitting.  Stand for long periods of time. How is this diagnosed? This condition may be diagnosed based on:  Your symptoms.  Your medical history.  A physical exam.  Imaging tests, such as:  X-rays to check your bones.  An MRI or ultrasound to check your tendons and muscles. During your physical exam, your health care provider will check the movement and strength of your hip. He or she may press on the point of your hip to check for pain. How is this treated? This condition may be treated by:  Resting.  Reducing your  activity.  Avoiding activities that cause pain.  Using crutches, a cane, or a walker to decrease the strain on your hip.  Taking medicine to help with swelling.  Having medicine injected into the bursae to help with swelling.  Using ice, heat, and massage therapy for pain relief.  Physical therapy exercises for strength and flexibility.  Surgery (rare). Follow these instructions at home: Activity   Rest.  Avoid activities that cause pain.  Return to your normal activities as told by your health care provider. Ask your health care provider what activities are safe for you. Managing pain, stiffness, and swelling   Take over-the-counter and prescription medicines only as told by your health care provider.  If directed, apply heat to the injured area as told by your health care provider.  Place a towel between your skin and the heat source.  Leave the heat on for 20-30 minutes.  Remove the heat if your skin turns bright red. This is especially important if you are unable to feel pain, heat, or cold. You may have a greater risk of getting burned.  If directed, apply ice to the injured area:  Put ice in a plastic bag.  Place a towel between your skin and the bag.  Leave the ice on for 20 minutes, 2-3 times a day. General instructions   If the affected leg is one that you use for driving, ask your health care provider when it is safe to drive.  Use crutches, a  cane, or a walker as told by your health care provider.  If one of your legs is shorter than the other, get fitted for a shoe insert.  Lose weight if you are overweight. How is this prevented?  Wear supportive footwear that is appropriate for your sport.  If you have hip pain, start any new exercise or sport slowly.  Maintain physical fitness, including:  Strength.  Flexibility. Contact a health care provider if:  Your pain does not improve with 2-4 weeks. Get help right away if:  You develop severe  pain.  You have a fever.  You develop increased redness over your hip.  You have a change in your bowel function or bladder function.  You cannot control the muscles in your feet. This information is not intended to replace advice given to you by your health care provider. Make sure you discuss any questions you have with your health care provider. Document Released: 09/22/2004 Document Revised: 04/20/2016 Document Reviewed: 07/31/2015 Elsevier Interactive Patient Education  2017 [employer].

## 2016-11-04 ENCOUNTER — Other Ambulatory Visit: Payer: Self-pay | Admitting: Family Medicine

## 2016-11-09 ENCOUNTER — Encounter: Payer: Self-pay | Admitting: Family Medicine

## 2016-11-17 DIAGNOSIS — M7061 Trochanteric bursitis, right hip: Secondary | ICD-10-CM | POA: Diagnosis not present

## 2016-11-23 ENCOUNTER — Other Ambulatory Visit: Payer: Self-pay | Admitting: *Deleted

## 2016-11-23 ENCOUNTER — Other Ambulatory Visit: Payer: Self-pay | Admitting: Family Medicine

## 2016-11-23 MED ORDER — METOPROLOL TARTRATE 50 MG PO TABS: 75.0000 mg | ORAL_TABLET | Freq: Two times a day (BID) | ORAL | 0 refills | Status: DC

## 2016-11-24 DIAGNOSIS — M25551 Pain in right hip: Secondary | ICD-10-CM | POA: Diagnosis not present

## 2016-11-28 ENCOUNTER — Other Ambulatory Visit: Payer: Self-pay

## 2016-11-28 MED ORDER — APIXABAN 5 MG PO TABS: 5.0000 mg | ORAL_TABLET | Freq: Two times a day (BID) | ORAL | 6 refills | Status: DC

## 2016-11-28 NOTE — Telephone Encounter (Signed)
Refilled fax request for eliquis

## 2016-11-28 NOTE — Telephone Encounter (Signed)
Refilled fax for eliquis

## 2016-12-01 DIAGNOSIS — M25551 Pain in right hip: Secondary | ICD-10-CM | POA: Diagnosis not present

## 2016-12-08 DIAGNOSIS — M25551 Pain in right hip: Secondary | ICD-10-CM | POA: Diagnosis not present

## 2016-12-15 DIAGNOSIS — M25551 Pain in right hip: Secondary | ICD-10-CM | POA: Diagnosis not present

## 2016-12-23 DIAGNOSIS — M25551 Pain in right hip: Secondary | ICD-10-CM | POA: Diagnosis not present

## 2016-12-29 DIAGNOSIS — M25551 Pain in right hip: Secondary | ICD-10-CM | POA: Diagnosis not present

## 2017-01-05 DIAGNOSIS — M25551 Pain in right hip: Secondary | ICD-10-CM | POA: Diagnosis not present

## 2017-01-13 DIAGNOSIS — S92534A Nondisplaced fracture of distal phalanx of right lesser toe(s), initial encounter for closed fracture: Secondary | ICD-10-CM | POA: Diagnosis not present

## 2017-01-13 DIAGNOSIS — M7061 Trochanteric bursitis, right hip: Secondary | ICD-10-CM | POA: Diagnosis not present

## 2017-01-13 DIAGNOSIS — S99821A Other specified injuries of right foot, initial encounter: Secondary | ICD-10-CM | POA: Diagnosis not present

## 2017-01-19 DIAGNOSIS — M25551 Pain in right hip: Secondary | ICD-10-CM | POA: Diagnosis not present

## 2017-01-25 ENCOUNTER — Other Ambulatory Visit: Payer: Self-pay | Admitting: Cardiology

## 2017-01-26 ENCOUNTER — Other Ambulatory Visit: Payer: Self-pay | Admitting: Cardiology

## 2017-01-26 DIAGNOSIS — Z1283 Encounter for screening for malignant neoplasm of skin: Secondary | ICD-10-CM | POA: Diagnosis not present

## 2017-01-26 DIAGNOSIS — M25551 Pain in right hip: Secondary | ICD-10-CM | POA: Diagnosis not present

## 2017-01-26 DIAGNOSIS — D225 Melanocytic nevi of trunk: Secondary | ICD-10-CM | POA: Diagnosis not present

## 2017-01-26 DIAGNOSIS — X32XXXD Exposure to sunlight, subsequent encounter: Secondary | ICD-10-CM | POA: Diagnosis not present

## 2017-01-26 DIAGNOSIS — L570 Actinic keratosis: Secondary | ICD-10-CM | POA: Diagnosis not present

## 2017-01-27 ENCOUNTER — Ambulatory Visit: Payer: 59 | Admitting: Cardiology

## 2017-01-27 ENCOUNTER — Ambulatory Visit (INDEPENDENT_AMBULATORY_CARE_PROVIDER_SITE_OTHER): Payer: 59 | Admitting: Cardiology

## 2017-01-27 ENCOUNTER — Encounter: Payer: Self-pay | Admitting: Cardiology

## 2017-01-27 DIAGNOSIS — I10 Essential (primary) hypertension: Secondary | ICD-10-CM | POA: Diagnosis not present

## 2017-01-27 DIAGNOSIS — E785 Hyperlipidemia, unspecified: Secondary | ICD-10-CM

## 2017-01-27 DIAGNOSIS — I482 Chronic atrial fibrillation, unspecified: Secondary | ICD-10-CM

## 2017-01-27 DIAGNOSIS — G4733 Obstructive sleep apnea (adult) (pediatric): Secondary | ICD-10-CM | POA: Diagnosis not present

## 2017-01-27 DIAGNOSIS — I2510 Atherosclerotic heart disease of native coronary artery without angina pectoris: Secondary | ICD-10-CM

## 2017-01-27 DIAGNOSIS — D6859 Other primary thrombophilia: Secondary | ICD-10-CM | POA: Insufficient documentation

## 2017-01-27 DIAGNOSIS — Z9861 Coronary angioplasty status: Secondary | ICD-10-CM

## 2017-01-27 DIAGNOSIS — Z7901 Long term (current) use of anticoagulants: Secondary | ICD-10-CM

## 2017-01-27 MED ORDER — SILDENAFIL CITRATE 100 MG PO TABS: ORAL_TABLET | ORAL | 0 refills | Status: DC

## 2017-01-27 NOTE — Assessment & Plan Note (Signed)
Appears to be chronic AF. He does have some exertional symptoms and asked about treatment options.

## 2017-01-27 NOTE — Assessment & Plan Note (Signed)
B/P controlled 

## 2017-01-27 NOTE — Assessment & Plan Note (Signed)
[place] Nov 2016- cath [facility] lesion-DES

## 2017-01-27 NOTE — Assessment & Plan Note (Signed)
Eliquis. CHA2DS2 VASc=3

## 2017-01-27 NOTE — Assessment & Plan Note (Signed)
Type 2 NIDDM 

## 2017-01-27 NOTE — Assessment & Plan Note (Signed)
LDL 69 March 2018

## 2017-01-27 NOTE — Assessment & Plan Note (Signed)
Reports compliance with C-pap 

## 2017-01-27 NOTE — Patient Instructions (Addendum)
Medication Instructions:   Your physician recommends that you continue on your current medications as directed. Please refer to the Current Medication list given to you today.  Labwork:  NONE  Testing/Procedures:  NONE  Follow-Up:  Your physician recommends that you schedule a follow-up appointment in: 1 year with Dr. Harl Bowie. You will receive a reminder letter in the mail in about 10 months reminding you to call and schedule your appointment. If you don't receive this letter, please contact our office.  Any Other Special Instructions Will Be Listed Below (If Applicable).  Please call our office if your indigestion symptoms change.  Please contact your family doctor about refilling your lexapro.  If you need a refill on your cardiac medications before your next appointment, please call your pharmacy.

## 2017-01-27 NOTE — Assessment & Plan Note (Signed)
>>  ASSESSMENT AND PLAN FOR CHRONIC ANTICOAGULATION WRITTEN ON 01/27/2017  2:49 PM BY Diona FantiKILROY, LUKE K, PA-C  Eliquis. CHA2DS2 VASc=3

## 2017-01-27 NOTE — Progress Notes (Signed)
01/27/2017 Shaun [name]   1956-05-12  [id number]  Primary Physician Mikey Kirschner, MD Primary Cardiologist: Dr Harl Bowie  HPI:  61 y/o male with a history of CAF, HTN, DM, and CAD. He had a mCFX PCI with DES in Nov 2016. His LOV was April 2017. In Feb of this year he was seen in the ED with :balance problems". MRI and CT were negative. He was treated with Antivert and his symptoms resolved in a week.   He has long standing atrial fibrillation. He tells me it prevents him from being as active as he would like. He feels he is limited in doing yard work and other moderately exertional activities. He asked if there was anything that could be done about his AF and I told him I would discuss this with dr Harl Bowie.  He has intermittent "indigestion", relieved with antacids. No associated arm or jaw pain but he will let us know if his symptoms change.     Current Outpatient Prescriptions  Medication Sig Dispense Refill  . apixaban (ELIQUIS) 5 MG TABS tablet Take 1 tablet (5 mg total) by mouth 2 (two) times daily. 60 tablet 6  . atorvastatin (LIPITOR) 80 MG tablet TAKE 1 TABLET BY MOUTH DAILY 90 tablet 0  . HYDROcodone-acetaminophen (NORCO/VICODIN) 5-325 MG tablet Take 0.5 tablets by mouth 4 (four) times daily as needed for moderate pain or severe pain. 60 tablet 0  . lisinopril (PRINIVIL,ZESTRIL) 5 MG tablet TAKE 1 TABLET BY MOUTH DAILY 90 tablet 3  . meclizine (ANTIVERT) 25 MG tablet Take 1 tablet (25 mg total) by mouth 3 (three) times daily as needed for dizziness. 30 tablet 0  . metFORMIN (GLUCOPHAGE) 500 MG tablet TAKE 2 TABLETS BY MOUTH TWICE DAILY WITH A MEAL 360 tablet 0  . metoprolol tartrate (LOPRESSOR) 50 MG tablet TAKE 1 AND 1/2 TABLETS BY MOUTH TWICE DAILY 270 tablet 1  . nitroGLYCERIN (NITROSTAT) 0.4 MG SL tablet Place 1 tablet (0.4 mg total) under the tongue every 5 (five) minutes as needed. (Patient taking differently: Place 0.4 mg under the tongue every 5 (five) minutes as  needed for chest pain. ) 25 tablet 3  . Omega-3 Fatty Acids (FISH OIL PO) Take 1 capsule by mouth daily.     . pantoprazole (PROTONIX) 40 MG tablet TAKE 1 TABLET BY MOUTH DAILY 30 tablet 5  . rOPINIRole (REQUIP) 2 MG tablet TAKE 1 TABLET BY MOUTH EVERY NIGHT AT BEDTIME 30 tablet 5  . sildenafil (VIAGRA) 100 MG tablet TAKE 1 TABLET BY MOUTH EVERY DAY AS NEEDED FOR ERECTILE DYSFUNCTION 10 tablet 0  . sodium chloride (OCEAN) 0.65 % SOLN nasal spray Place 1 spray into both nostrils as needed for congestion.    . traZODone (DESYREL) 50 MG tablet TAKE 2 TABLETS(100 MG) BY MOUTH AT BEDTIME 60 tablet 5  . triamcinolone cream (KENALOG) 0.1 % Apply 1 application topically 2 (two) times daily. (Patient taking differently: Apply 1 application topically daily as needed (for irritation). ) 60 g 1   No current facility-administered medications for this visit.     No Known Allergies  Past Medical History:  Diagnosis Date  . Arteriosclerotic cardiovascular disease (ASCVD)    Nonobstructive; cath in 2000: 30-40% mid LAD and proximal RCA;normal EF. stress nuclear in 2006-subtle inferoseptal hypoperfusion with reversibility; negative stress EKG; good exercise tolerance  . Asthma   . CHF (congestive heart failure) ([place])   . Colon polyps    30 colon polyps found on first  colonoscopy  . Diabetes mellitus without complication ([place])   . Hyperlipidemia   . Hypertension   . Insomnia   . Obstructive sleep apnea 12/2009   01/26/2010 AHI 83/hr  . Paroxysmal atrial fibrillation ([place]) 10/2004   Onset in 10/2004; recurred 09/2008  . PUD (peptic ulcer disease)    1980s  . RLS (restless legs syndrome)   . Sinusitis     Social History   Social History  . Marital status: Married    Spouse name: N/A  . Number of children: 5  . Years of education: N/A   Occupational History  . employed Musician   Social History Main Topics  . Smoking status: Current Every Day Smoker    Packs/day: 1.00    Years:  32.00    Types: Cigarettes    Start date: 07/24/1970  . Smokeless tobacco: Never Used  . Alcohol use No  . Drug use: No  . Sexual activity: Yes    Partners: Female   Other Topics Concern  . Not on file   Social History Narrative  . No narrative on file     Family History  Problem Relation Age of Onset  . Hypertension Mother   . Breast cancer Mother 24  . Heart attack Father   . Diabetes Brother   . Skin cancer Sister 9  . Brain cancer Maternal Uncle   . Cancer Maternal Uncle        NOS  . Breast cancer Cousin        maternal cousin dx <50  . Colon cancer Neg Hx      Review of Systems: General: negative for chills, fever, night sweats or weight changes.  Cardiovascular: negative for chest pain, dyspnea on exertion, edema, orthopnea, palpitations, paroxysmal nocturnal dyspnea or shortness of breath Dermatological: negative for rash Respiratory: negative for cough or wheezing Urologic: negative for hematuria Abdominal: negative for nausea, vomiting, diarrhea, bright red blood per rectum, melena, or hematemesis Neurologic: negative for visual changes, syncope, or dizziness All other systems reviewed and are otherwise negative except as noted above.    Blood pressure 129/74, pulse 74, height 5\' 8"  (1.727 m), weight 229 lb (103.9 kg).  General appearance: alert, cooperative, no distress, moderately obese and poor dentition Neck: no carotid bruit and no JVD Lungs: clear to auscultation bilaterally Heart: irregularly irregular rhythm Extremities: extremities normal, atraumatic, no cyanosis or edema Neurologic: Grossly normal  EKG Feb 2018- AF with CVR-90's  ASSESSMENT AND PLAN:   CAD S/P percutaneous coronary angioplasty [place] Nov 2016- cath mCFX lesion-DES  Hypertension B/P controlled  Obstructive sleep apnea Reports compliance with C-pap  Non-insulin treated type 2 diabetes mellitus ([place]) Type 2 NIDDM  Atrial fibrillation ([place]) Appears to be chronic AF.  He does have some exertional symptoms and asked about treatment options.  Hyperlipidemia LDL goal <100 LDL 69 March 2018  Chronic anticoagulation Eliquis. CHA2DS2 VASc=3   PLAN  Will discuss AF clinic referral with Dr Harl Bowie. Pt asked fo Viagra refill which I provided with the instructions to never take if has taken NTG.   Kerin Ransom PA-C 01/27/2017 2:49 PM

## 2017-01-30 NOTE — Progress Notes (Signed)
Ok to refer to afib clinic to consider rhythm strategy given his symptoms with appropriate rate control   Zandra Abts MD

## 2017-01-31 ENCOUNTER — Telehealth (HOSPITAL_COMMUNITY): Payer: Self-pay | Admitting: *Deleted

## 2017-01-31 NOTE — Telephone Encounter (Signed)
Left message for patient to return call to set up appt.

## 2017-01-31 NOTE — Telephone Encounter (Signed)
-----   Message from Erlene Quan, [place] sent at 01/30/2017  1:42 PM EDT ----- Delrae Sawyers, could you contact this pt for evaluation in the AF clinic- referred by Dr Harl Bowie.  Kerin Ransom PA-C 01/30/2017 1:43 PM

## 2017-02-02 ENCOUNTER — Encounter (HOSPITAL_COMMUNITY): Payer: Self-pay | Admitting: *Deleted

## 2017-02-03 ENCOUNTER — Ambulatory Visit: Payer: 59 | Admitting: Family Medicine

## 2017-02-09 ENCOUNTER — Ambulatory Visit (INDEPENDENT_AMBULATORY_CARE_PROVIDER_SITE_OTHER): Payer: 59 | Admitting: Family Medicine

## 2017-02-09 ENCOUNTER — Other Ambulatory Visit: Payer: Self-pay | Admitting: Family Medicine

## 2017-02-09 ENCOUNTER — Encounter: Payer: Self-pay | Admitting: Family Medicine

## 2017-02-09 VITALS — BP 128/74 | Ht 68.0 in | Wt 232.6 lb

## 2017-02-09 DIAGNOSIS — E785 Hyperlipidemia, unspecified: Secondary | ICD-10-CM | POA: Diagnosis not present

## 2017-02-09 DIAGNOSIS — E119 Type 2 diabetes mellitus without complications: Secondary | ICD-10-CM | POA: Diagnosis not present

## 2017-02-09 DIAGNOSIS — F321 Major depressive disorder, single episode, moderate: Secondary | ICD-10-CM

## 2017-02-09 DIAGNOSIS — I10 Essential (primary) hypertension: Secondary | ICD-10-CM

## 2017-02-09 DIAGNOSIS — I482 Chronic atrial fibrillation, unspecified: Secondary | ICD-10-CM

## 2017-02-09 DIAGNOSIS — M1611 Unilateral primary osteoarthritis, right hip: Secondary | ICD-10-CM | POA: Diagnosis not present

## 2017-02-09 LAB — POCT GLYCOSYLATED HEMOGLOBIN (HGB A1C): Hemoglobin A1C: 5.7

## 2017-02-09 MED ORDER — HYDROCODONE-ACETAMINOPHEN 5-325 MG PO TABS: 0.5000 | ORAL_TABLET | Freq: Four times a day (QID) | ORAL | 0 refills | Status: DC | PRN

## 2017-02-09 MED ORDER — ESCITALOPRAM OXALATE 20 MG PO TABS: 20.0000 mg | ORAL_TABLET | Freq: Every day | ORAL | 2 refills | Status: DC

## 2017-02-09 MED ORDER — OMEPRAZOLE 40 MG PO CPDR: 40.0000 mg | DELAYED_RELEASE_CAPSULE | Freq: Every day | ORAL | 3 refills | Status: DC

## 2017-02-09 NOTE — Progress Notes (Signed)
Subjective:    Patient ID: Shaun [name], male    DOB: [birth year], 61 y.o.   MRN: [id number] Patient arrives office with a tremendous number of concerns many of which could keep him busy for a whole visit with the specialist HPI This patient was seen today for chronic pain  The medication list was reviewed and updated.   -Compliance with medication: yes  - Number patient states they take daily: one daily  -when was the last dose patient took? today  The patient was advised the importance of maintaining medication and not using illegal substances with these.  Refills needed: yes  The patient was educated that we can provide 3 monthly scripts for their medication, it is their responsibility to follow the instructions.  Side effects or complications from medications: none  Patient is aware that pain medications are meant to minimize the severity of the pain to allow their pain levels to improve to allow for better function. They are aware of that pain medications cannot totally remove their pain.  Due for UDT ( at least once per year) : due today. Pt states he cannot get sample today. Pain management contract given. Pt states he had [facility] drug screen [facility] last week.   Needs refills on lexapro 20mg . Takes one and a half daily. Pt states he has been out of med for over one month. States the medicine definitely helped her   Results for orders placed or performed in visit on 02/09/17  POCT glycosylated hemoglobin (Hb A1C)  Result Value Ref Range   Hemoglobin A1C 5.7    Patient claims compliance with diabetes medication. No obvious side effects. Reports no substantial low sugar spells. Most numbers are generally in good range when checked fasting. Generally does not miss a dose of medication. Watching diabetic diet closely  Blood pressure medicine and blood pressure levels reviewed today with patient. Compliant with blood pressure medicine. States does not miss a dose. No  obvious side effects. Blood pressure generally good when checked elsewhere. Watching salt intake.  Patient continues to take lipid medication regularly. No obvious side effects from it. Generally does not miss a dose. Prior blood work results are reviewed with patient. Patient continues to work on fat intake in diet  Chronic pain overall doing better, woith therapy has helped tremendously, taking just a pill a day, sometimetime one an a half per day   Patient notes ongoing compliance with antidepressant medication. No obvious side effects. Reports does not miss a dose. Overall continues to help depression substantially. No thoughts of homicide or suicide. Would like to maintain medication.  Eye doc last yr , no trouble with diab fro tht vision good  Patient compliant with pain medication. Continues to experience the pain which led to initiation of analgesic intervention. No significant negative side effects. States definitely needs the pain medication to maintain current level of functioning. Does not receive controlled substance pain medication elsewhere.   On anticoagulant and heart rate suppressant meds for chronic atrial fibrillation. States overall doing well. Review of Systems No headache, no major weight loss or weight gain, no chest pain no back pain abdominal pain no change in bowel habits complete ROS otherwise negative     Objective:   Physical Exam  Alert and oriented, vitals reviewed and stable, NAD ENT-TM's and ext canals WNL bilat via otoscopic exam Soft palate, tonsils and post pharynx WNL via oropharyngeal exam Neck-symmetric, no masses; thyroid nonpalpable and nontender Pulmonary-no tachypnea or accessory  muscle use; Clear without wheezes via auscultation Card--no abnrml murmurs, rhythm not reg and but controlled rate WNL Carotid pulses symmetric, without bruits  C diabetic foot exam      Assessment & Plan:  Impression 1 type 2 diabetes good control discussed  maintain same meds and approach diet exercise discussed #2 hypertension good control discussed maintain same meds #3 erectile dysfunction ongoing need for meds discussed reviewed #4 chronic anxiety/depression. Worsening recently. Out of meds importance of compliance discussed and resume Lexapro 5 chronic pain long discussion held need for medications. Meds refilled proper use discussed  Greater than 50% of this 40 minute face to face visit was spent in counseling and discussion and coordination of care regarding the above diagnosis/diagnosies

## 2017-02-16 ENCOUNTER — Ambulatory Visit (HOSPITAL_COMMUNITY)
Admission: RE | Admit: 2017-02-16 | Discharge: 2017-02-16 | Disposition: A | Discharge status: Home / Self Care | Payer: 59 | Source: Ambulatory Visit | Attending: Nurse Practitioner | Admitting: Nurse Practitioner

## 2017-02-16 ENCOUNTER — Encounter (HOSPITAL_COMMUNITY): Payer: Self-pay | Admitting: Nurse Practitioner

## 2017-02-16 VITALS — BP 114/76 | HR 73 | Ht 68.0 in | Wt 231.6 lb

## 2017-02-16 DIAGNOSIS — G4733 Obstructive sleep apnea (adult) (pediatric): Secondary | ICD-10-CM | POA: Diagnosis not present

## 2017-02-16 DIAGNOSIS — E119 Type 2 diabetes mellitus without complications: Secondary | ICD-10-CM | POA: Diagnosis not present

## 2017-02-16 DIAGNOSIS — I110 Hypertensive heart disease with heart failure: Secondary | ICD-10-CM | POA: Diagnosis not present

## 2017-02-16 DIAGNOSIS — Z7901 Long term (current) use of anticoagulants: Secondary | ICD-10-CM | POA: Diagnosis not present

## 2017-02-16 DIAGNOSIS — G2581 Restless legs syndrome: Secondary | ICD-10-CM | POA: Insufficient documentation

## 2017-02-16 DIAGNOSIS — I482 Chronic atrial fibrillation: Secondary | ICD-10-CM | POA: Insufficient documentation

## 2017-02-16 DIAGNOSIS — I2510 Atherosclerotic heart disease of native coronary artery without angina pectoris: Secondary | ICD-10-CM | POA: Diagnosis not present

## 2017-02-16 DIAGNOSIS — Z79899 Other long term (current) drug therapy: Secondary | ICD-10-CM | POA: Diagnosis not present

## 2017-02-16 DIAGNOSIS — I480 Paroxysmal atrial fibrillation: Secondary | ICD-10-CM | POA: Insufficient documentation

## 2017-02-16 DIAGNOSIS — Z8711 Personal history of peptic ulcer disease: Secondary | ICD-10-CM | POA: Diagnosis not present

## 2017-02-16 DIAGNOSIS — F17210 Nicotine dependence, cigarettes, uncomplicated: Secondary | ICD-10-CM | POA: Insufficient documentation

## 2017-02-16 DIAGNOSIS — I4891 Unspecified atrial fibrillation: Secondary | ICD-10-CM | POA: Diagnosis present

## 2017-02-16 DIAGNOSIS — Z7984 Long term (current) use of oral hypoglycemic drugs: Secondary | ICD-10-CM | POA: Diagnosis not present

## 2017-02-16 DIAGNOSIS — E785 Hyperlipidemia, unspecified: Secondary | ICD-10-CM | POA: Diagnosis not present

## 2017-02-16 DIAGNOSIS — I509 Heart failure, unspecified: Secondary | ICD-10-CM | POA: Diagnosis not present

## 2017-02-16 NOTE — Progress Notes (Signed)
Primary Care Physician: Mikey Kirschner, MD Referring Physician: Dr. Georgette Shell [name] is a 61 y.o. male with a h/o CAD, OSA tx with cpap,CHF, DM, HTN, longstanding chronic Afib x 8 years in the  afib clinic for evaluation. He had recently asked if anything could be done to restore SR. He is not symptomatic at rest but with moderate activities he gets shortness of breath. He has never had a cardioversion or tried antiarrythmic's. Had symptoms of sleep apnea for years before it was treated with cpap which may have been the trigger for afib. He does use every night now. No alcohol or excessive caffeine but he does some cigarettes. He is on Eliquis for a CHA2DS2VASc score of 3 and is on metoprolol for rate control. He is rate controlled.  Today, he denies symptoms of palpitations, chest pain, shortness of breath, orthopnea, PND, lower extremity edema, dizziness, presyncope, syncope, or neurologic sequela. The patient is tolerating medications without difficulties and is otherwise without complaint today.   Past Medical History:  Diagnosis Date  . Arteriosclerotic cardiovascular disease (ASCVD)    Nonobstructive; cath in 2000: 30-40% mid LAD and proximal RCA;normal EF. stress nuclear in 2006-subtle inferoseptal hypoperfusion with reversibility; negative stress EKG; good exercise tolerance  . Asthma   . CHF (congestive heart failure) ([place])   . Colon polyps    30 colon polyps found on first colonoscopy  . Diabetes mellitus without complication ([place])   . Hyperlipidemia   . Hypertension   . Insomnia   . Obstructive sleep apnea 12/2009   01/26/2010 AHI 83/hr  . Paroxysmal atrial fibrillation ([place]) 10/2004   Onset in 10/2004; recurred 09/2008  . PUD (peptic ulcer disease)    1980s  . RLS (restless legs syndrome)   . Sinusitis    Past Surgical History:  Procedure Laterality Date  . CARDIAC CATHETERIZATION N/A 07/21/2015   Procedure: Left Heart Cath and Coronary Angiography;  Surgeon:  Peter M Martinique, MD;  Location: [facility];  Service: Cardiovascular;  Laterality: N/A;  . CARDIAC CATHETERIZATION N/A 07/21/2015   Procedure: Coronary Stent Intervention;  Surgeon: Peter M Martinique, MD;  Location: [facility];  Service: Cardiovascular;  Laterality: N/A;  . COLONOSCOPY N/A 05/19/2014   Dr. Barnie Alderman diverticulosis/moderate external hemorrhoids, >20 simple adenomas. Genetic screening negative.   Marland Kitchen HERNIA REPAIR  1986   Left inguinal  . ROTATOR CUFF REPAIR     Right    Current Outpatient Prescriptions  Medication Sig Dispense Refill  . apixaban (ELIQUIS) 5 MG TABS tablet Take 1 tablet (5 mg total) by mouth 2 (two) times daily. 60 tablet 6  . atorvastatin (LIPITOR) 80 MG tablet TAKE 1 TABLET BY MOUTH DAILY 90 tablet 0  . escitalopram (LEXAPRO) 20 MG tablet Take 1 tablet (20 mg total) by mouth daily. 30 tablet 2  . HYDROcodone-acetaminophen (NORCO/VICODIN) 5-325 MG tablet Take 0.5 tablets by mouth 4 (four) times daily as needed for moderate pain or severe pain. 45 tablet 0  . lisinopril (PRINIVIL,ZESTRIL) 5 MG tablet TAKE 1 TABLET BY MOUTH DAILY 90 tablet 3  . meclizine (ANTIVERT) 25 MG tablet Take 1 tablet (25 mg total) by mouth 3 (three) times daily as needed for dizziness. 30 tablet 0  . metFORMIN (GLUCOPHAGE) 500 MG tablet Take 500 mg by mouth 2 (two) times daily with a meal.    . metoprolol tartrate (LOPRESSOR) 50 MG tablet TAKE 1 AND 1/2 TABLETS BY MOUTH TWICE DAILY 270 tablet 1  .  nitroGLYCERIN (NITROSTAT) 0.4 MG SL tablet Place 1 tablet (0.4 mg total) under the tongue every 5 (five) minutes as needed. (Patient taking differently: Place 0.4 mg under the tongue every 5 (five) minutes as needed for chest pain. ) 25 tablet 3  . Omega-3 Fatty Acids (FISH OIL PO) Take 1 capsule by mouth daily.     Marland Kitchen omeprazole (PRILOSEC) 40 MG capsule TAKE 1 CAPSULE BY MOUTH DAILY 90 capsule 3  . rOPINIRole (REQUIP) 2 MG tablet TAKE 1 TABLET BY MOUTH EVERY NIGHT AT BEDTIME 30  tablet 5  . sildenafil (VIAGRA) 100 MG tablet TAKE 1 TABLET BY MOUTH EVERY DAY AS NEEDED FOR ERECTILE DYSFUNCTION 10 tablet 0  . sodium chloride (OCEAN) 0.65 % SOLN nasal spray Place 1 spray into both nostrils as needed for congestion.    . traZODone (DESYREL) 50 MG tablet TAKE 2 TABLETS(100 MG) BY MOUTH AT BEDTIME 60 tablet 5  . triamcinolone cream (KENALOG) 0.1 % Apply 1 application topically 2 (two) times daily. (Patient taking differently: Apply 1 application topically daily as needed (for irritation). ) 60 g 1   No current facility-administered medications for this encounter.     No Known Allergies  Social History   Social History  . Marital status: Married    Spouse name: N/A  . Number of children: 5  . Years of education: N/A   Occupational History  . employed Musician   Social History Main Topics  . Smoking status: Current Every Day Smoker    Packs/day: 1.00    Years: 32.00    Types: Cigarettes    Start date: 07/24/1970  . Smokeless tobacco: Never Used  . Alcohol use No  . Drug use: No  . Sexual activity: Yes    Partners: Female   Other Topics Concern  . Not on file   Social History Narrative  . No narrative on file    Family History  Problem Relation Age of Onset  . Hypertension Mother   . Breast cancer Mother 65  . Heart attack Father   . Diabetes Brother   . Skin cancer Sister 70  . Brain cancer Maternal Uncle   . Cancer Maternal Uncle        NOS  . Breast cancer Cousin        maternal cousin dx <50  . Colon cancer Neg Hx     ROS- All systems are reviewed and negative except as per the HPI above  Physical Exam: Vitals:   02/16/17 0943  BP: 114/76  Pulse: 73  Weight: 231 lb 9.6 oz (105.1 kg)  Height: 5\' 8"  (1.727 m)   Wt Readings from Last 3 Encounters:  02/16/17 231 lb 9.6 oz (105.1 kg)  02/09/17 232 lb 9.6 oz (105.5 kg)  01/27/17 229 lb (103.9 kg)    Labs: Lab Results  Component Value Date   NA 142 10/27/2016   K  4.3 10/27/2016   CL 100 10/27/2016   CO2 24 10/27/2016   GLUCOSE 105 (H) 10/27/2016   BUN 17 10/27/2016   CREATININE 0.96 10/27/2016   CALCIUM 8.6 10/27/2016   PHOS 3.4 07/20/2015   MG 1.9 07/21/2015   Lab Results  Component Value Date   INR 0.95 10/18/2016   Lab Results  Component Value Date   CHOL 138 10/27/2016   HDL 29 (L) 10/27/2016   LDLCALC 69 10/27/2016   TRIG 199 (H) 10/27/2016     GEN- The patient is well appearing,  alert and oriented x 3 today.   Head- normocephalic, atraumatic Eyes-  Sclera clear, conjunctiva pink Ears- hearing intact Oropharynx- clear Neck- supple, no JVP Lymph- no cervical lymphadenopathy Lungs- Clear to ausculation bilaterally, normal work of breathing Heart-irregular rate and rhythm, no murmurs, rubs or gallops, PMI not laterally displaced GI- soft, NT, ND, + BS Extremities- no clubbing, cyanosis, or edema MS- no significant deformity or atrophy Skin- no rash or lesion Psych- euthymic mood, full affect Neuro- strength and sensation are intact  EKG-afib at 73 bpm, qrs int 90 ms, qtc 409 ms Epic records reviewed Echo-Study Conclusions  - Left ventricle: The cavity size was normal. Wall thickness was   increased in a pattern of moderate LVH. Systolic function was   normal. The estimated ejection fraction was in the range of 60%   to 65%. Wall motion was normal; there were no regional wall   motion abnormalities. The study is not technically sufficient to   allow evaluation of LV diastolic function. - Mitral valve: Mildly thickened leaflets . There was mild   regurgitation. - Left atrium: The atrium was mildly dilated. - Right atrium: Central venous pressure (est): 3 mm Hg. - Tricuspid valve: There was trivial regurgitation. - Pulmonary arteries: Systolic pressure could not be accurately   estimated. - Pericardium, extracardiac: A trivial pericardial effusion was   identified.  Impressions:  - Moderate LVH with LVEF 60-65%.  Indeterminate diastolic function.   Mild left atrial enlargement. Mildly thickened mitral leaflets   with mild mitral regurgitation. Trivial tricuspid regurgitation.   Trivial pericardial effusion.    Assessment and Plan: 1. Longstanding chronic afib with rate control Unfortunately, pt has been in afib so long( 8 yrs) with a Left atrial size of 50, that efforts to restore SR would probably be ineffective or unable to keep pt in SR long term. He is not an ablation candidate due to persistence of afib and left atrial size. I am not against trying Tikosyn but I really doubt that this would be successful to restore/ maintain SR Could consider stand alone Maze procedure but I am not sure if his symptoms are significant enough to go thru with  this extensive surgery Therefore, he will continue rate control meds and anticoagulation with Eliquis  Continue treatment of sleep apnea with cpap Smoking cessation encouraged  Butch Penny C. Caridad Silveira, [facility] [address] [place], [place] [address] [phone]

## 2017-02-23 ENCOUNTER — Other Ambulatory Visit: Payer: Self-pay | Admitting: Family Medicine

## 2017-02-27 DIAGNOSIS — M25551 Pain in right hip: Secondary | ICD-10-CM | POA: Diagnosis not present

## 2017-03-16 ENCOUNTER — Other Ambulatory Visit: Payer: Self-pay | Admitting: Family Medicine

## 2017-03-24 IMAGING — NM NM MYOCAR MULTI W/SPECT W/WALL MOTION & EF
2 series · 12 of 12 positions shown · non-contrast
Comparison: none

[Series 1: rest · 8.28mm/px · 6 of 64 frames shown]
[frame 6/64]
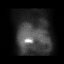
[frame 16/64]
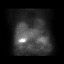
[frame 27/64]
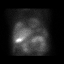
[frame 38/64]
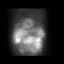
[frame 48/64]
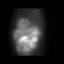
[frame 59/64]
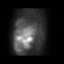

[Series 2: stress gated · 8.28mm/px · 6 of 64 frames shown]
[frame 6/64]
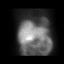
[frame 16/64]
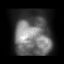
[frame 27/64]
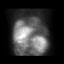
[frame 38/64]
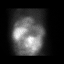
[frame 48/64]
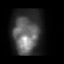
[frame 59/64]
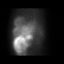

[12 of 12 positions shown; findings below may reference images not displayed]

Canned report from images found in remote index.

Refer to host system for actual result text.

## 2017-04-14 ENCOUNTER — Other Ambulatory Visit: Payer: Self-pay | Admitting: Family Medicine

## 2017-04-16 NOTE — Telephone Encounter (Signed)
Ok six mo worth 

## 2017-04-25 ENCOUNTER — Other Ambulatory Visit: Payer: Self-pay | Admitting: Family Medicine

## 2017-05-03 ENCOUNTER — Other Ambulatory Visit: Payer: Self-pay | Admitting: Cardiology

## 2017-05-08 ENCOUNTER — Other Ambulatory Visit: Payer: Self-pay | Admitting: Family Medicine

## 2017-05-08 NOTE — Telephone Encounter (Signed)
Last seen 02/09/17

## 2017-05-11 ENCOUNTER — Encounter: Payer: Self-pay | Admitting: Family Medicine

## 2017-05-11 ENCOUNTER — Ambulatory Visit (INDEPENDENT_AMBULATORY_CARE_PROVIDER_SITE_OTHER): Payer: 59 | Admitting: Family Medicine

## 2017-05-11 VITALS — BP 122/80 | Ht 68.0 in | Wt 237.0 lb

## 2017-05-11 DIAGNOSIS — I482 Chronic atrial fibrillation, unspecified: Secondary | ICD-10-CM

## 2017-05-11 DIAGNOSIS — Z79891 Long term (current) use of opiate analgesic: Secondary | ICD-10-CM | POA: Diagnosis not present

## 2017-05-11 DIAGNOSIS — Z23 Encounter for immunization: Secondary | ICD-10-CM

## 2017-05-11 DIAGNOSIS — I10 Essential (primary) hypertension: Secondary | ICD-10-CM

## 2017-05-11 DIAGNOSIS — E119 Type 2 diabetes mellitus without complications: Secondary | ICD-10-CM | POA: Diagnosis not present

## 2017-05-11 LAB — POCT GLYCOSYLATED HEMOGLOBIN (HGB A1C): Hemoglobin A1C: 5.7

## 2017-05-11 MED ORDER — HYDROCODONE-ACETAMINOPHEN 5-325 MG PO TABS: 0.5000 | ORAL_TABLET | Freq: Four times a day (QID) | ORAL | 0 refills | Status: DC | PRN

## 2017-05-11 NOTE — Progress Notes (Signed)
Subjective:    Patient ID: Shaun [name], male    DOB: 03-May-1956, 61 y.o.   MRN: [id number]  HPI This patient was seen today for chronic pain  The medication list was reviewed and updated. Takes for right hip pain   -Compliance with medication: yes  - Number patient states they take daily: one and a half   -when was the last dose patient took? Today.   The patient was advised the importance of maintaining medication and not using illegal substances with these.  Refills needed: yes - still has one of the three scripts he got last time he has not filled.   The patient was educated that we can provide 3 monthly scripts for their medication, it is their responsibility to follow the instructions.  Side effects or complications from medications: none  Patient is aware that pain medications are meant to minimize the severity of the pain to allow their pain levels to improve to allow for better function. They are aware of that pain medications cannot totally remove their pain.  Due for UDT ( at least once per year) : due today. Signed pain contract at last visit.   Diabetes. Pt states glucose has been running good. A1C done today. Pt sees eye doctor next month.  Results for orders placed or performed in visit on 05/11/17  POCT glycosylated hemoglobin (Hb A1C)  Result Value Ref Range   Hemoglobin A1C 5.7    RLS worsening somewhat, has been on dose for a long time, wonders if can incr the med, does not take til night, some days starts.  Patient claims compliance with diabetes medication. No obvious side effects. Reports no substantial low sugar spells. Most numbers are generally in good range when checked fasting. Generally does not miss a dose of medication. Watching diabetic diet closely  Blood pressure medicine and blood pressure levels reviewed today with patient. Compliant with blood pressure medicine. States does not miss a dose. No obvious side effects. Blood pressure  generally good when checked elsewhere. Watching salt intake.   Patient continues to take lipid medication regularly. No obvious side effects from it. Generally does not miss a dose. Prior blood work results are reviewed with patient. Patient continues to work on fat intake in diet  Patient notes ongoing compliance with antidepressant medication. No obvious side effects. Reports does not miss a dose. Overall continues to help depression substantially. No thoughts of homicide or suicide. Would like to maintain medication.  Patient compliant with pain medication. Continues to experience the pain which led to initiation of analgesic intervention. No significant negative side effects. States definitely needs the pain medication to maintain current level of functioning. Does not receive controlled substance pain medication elsewhere. Right hip pain th worse  Flu vaccine today.       Review of Systems No headache, no major weight loss or weight gain, no chest pain no back pain abdominal pain no change in bowel habits complete ROS otherwise negative     Objective:   Physical Exam  Alert and oriented, vitals reviewed and stable, NAD ENT-TM's and ext canals WNL bilat via otoscopic exam Soft palate, tonsils and post pharynx WNL via oropharyngeal exam Neck-symmetric, no masses; thyroid nonpalpable and nontender Pulmonary-no tachypnea or accessory muscle use; Clear without wheezes via auscultation Card--no abnrml murmurs, rhythm reg and rate WNL Carotid pulses symmetric, without bruits       Assessment & Plan:  Impression #1nd and type 2 diabetes good control discussed  maintain same meds #2 hypertension good control discussed compliance discussed blood pressures numbers discussed maintain same #3 depression clinically stable. Will maintain same medication. Diet exercise discussed. Flu shot given. Appropriate blood work.  Patient compliant with pain medication. Continues to experience the pain  which led to initiation of analgesic intervention. No significant negative side effects. States definitely needs the pain medication to maintain current level of functioning. Does not receive controlled substance pain medication elsewhere.

## 2017-05-18 LAB — TOXASSURE SELECT 13 (MW), URINE

## 2017-05-21 ENCOUNTER — Other Ambulatory Visit: Payer: Self-pay | Admitting: Family Medicine

## 2017-06-01 ENCOUNTER — Other Ambulatory Visit: Payer: Self-pay | Admitting: Family Medicine

## 2017-06-05 ENCOUNTER — Ambulatory Visit
Admission: RE | Admit: 2017-06-05 | Discharge: 2017-06-05 | Disposition: A | Discharge status: Home / Self Care | Payer: 59 | Source: Ambulatory Visit | Attending: Cardiology | Admitting: Cardiology

## 2017-06-05 ENCOUNTER — Other Ambulatory Visit: Payer: Self-pay | Admitting: Cardiology

## 2017-06-05 ENCOUNTER — Ambulatory Visit (INDEPENDENT_AMBULATORY_CARE_PROVIDER_SITE_OTHER): Payer: 59 | Admitting: Cardiology

## 2017-06-05 ENCOUNTER — Encounter: Payer: Self-pay | Admitting: Cardiology

## 2017-06-05 ENCOUNTER — Telehealth: Payer: Self-pay | Admitting: *Deleted

## 2017-06-05 VITALS — BP 124/84 | HR 73 | Ht 68.0 in | Wt 239.0 lb

## 2017-06-05 DIAGNOSIS — R0602 Shortness of breath: Secondary | ICD-10-CM | POA: Diagnosis not present

## 2017-06-05 DIAGNOSIS — R079 Chest pain, unspecified: Secondary | ICD-10-CM

## 2017-06-05 DIAGNOSIS — I482 Chronic atrial fibrillation, unspecified: Secondary | ICD-10-CM

## 2017-06-05 DIAGNOSIS — E7800 Pure hypercholesterolemia, unspecified: Secondary | ICD-10-CM

## 2017-06-05 DIAGNOSIS — R0600 Dyspnea, unspecified: Secondary | ICD-10-CM

## 2017-06-05 DIAGNOSIS — I10 Essential (primary) hypertension: Secondary | ICD-10-CM

## 2017-06-05 LAB — BASIC METABOLIC PANEL
BUN/Creatinine Ratio: 18 (ref 10–24)
BUN: 16 mg/dL (ref 8–27)
CALCIUM: 9.1 mg/dL (ref 8.6–10.2)
CHLORIDE: 102 mmol/L (ref 96–106)
CO2: 20 mmol/L (ref 20–29)
CREATININE: 0.89 mg/dL (ref 0.76–1.27)
GFR calc Af Amer: 107 mL/min/{1.73_m2} (ref >59)
GFR calc non Af Amer: 93 mL/min/{1.73_m2} (ref >59)
GLUCOSE: 90 mg/dL (ref 65–99)
Potassium: 4.4 mmol/L (ref 3.5–5.2)
Sodium: 139 mmol/L (ref 134–144)

## 2017-06-05 LAB — CBC WITH DIFFERENTIAL/PLATELET
Basophils Absolute: 0 10*3/uL (ref 0.0–0.2)
Basos: 0 %
EOS (ABSOLUTE): 0.2 10*3/uL (ref 0.0–0.4)
EOS: 2 %
HEMATOCRIT: 34.7 % — AB (ref 37.5–51.0)
Hemoglobin: 11.2 g/dL — ABNORMAL LOW (ref 13.0–17.7)
Immature Grans (Abs): 0 10*3/uL (ref 0.0–0.1)
Immature Granulocytes: 0 %
LYMPHS ABS: 2 10*3/uL (ref 0.7–3.1)
Lymphs: 25 %
MCH: 23 pg — ABNORMAL LOW (ref 26.6–33.0)
MCHC: 32.3 g/dL (ref 31.5–35.7)
MCV: 71 fL — ABNORMAL LOW (ref 79–97)
MONOCYTES: 9 %
Monocytes Absolute: 0.7 10*3/uL (ref 0.1–0.9)
NEUTROS ABS: 5 10*3/uL (ref 1.4–7.0)
Neutrophils: 64 %
PLATELETS: 214 10*3/uL (ref 150–379)
RBC: 4.88 x10E6/uL (ref 4.14–5.80)
RDW: 16.6 % — ABNORMAL HIGH (ref 12.3–15.4)
WBC: 7.9 10*3/uL (ref 3.4–10.8)

## 2017-06-05 LAB — PROTIME-INR
INR: 1 (ref 0.8–1.2)
PROTHROMBIN TIME: 10.7 s (ref 9.1–12.0)

## 2017-06-05 LAB — APTT: aPTT: 26 s (ref 24–33)

## 2017-06-05 NOTE — Telephone Encounter (Signed)
Patient was seen by Dr Stanford Breed this am. He needed to get an Echo, 8 week f/u with PA/NP, and a 3-4 month f/u with Dr Stanford Breed scheduled when he left. Patient did not schedule. Left message to call back

## 2017-06-05 NOTE — Progress Notes (Signed)
HPI: 61 year old male for follow-up of history of permanent atrial fibrillation, coronary artery disease, diabetes mellitus and hypertension. Patient is followed by Dr. Harl Bowie. Patient is status post PCI of his circumflex in November 2016. He otherwise had nonobstructive disease. Echocardiogram November 2016 showed normal LV systolic function, moderate left ventricular hypertrophy, mild mitral regurgitation and mild left atrial enlargement. Patient was seen in atrial fibrillation clinic in June 2018 to see if there are options at restoring sinus rhythm. Given long-standing atrial fibrillation and left atrial enlargement it was felt that rate control and anticoagulation indicated. Since last seen,  patient notes increasing dyspnea on exertion for one month. He has a mild chest discomfort with exertion relieved with rest. No palpitations or syncope. No pedal edema. Symptoms are similar to those prior to his previous PCI.  Current Outpatient Prescriptions  Medication Sig Dispense Refill  . apixaban (ELIQUIS) 5 MG TABS tablet Take 1 tablet (5 mg total) by mouth 2 (two) times daily. 60 tablet 6  . atorvastatin (LIPITOR) 80 MG tablet TAKE 1 TABLET BY MOUTH EVERY DAY 90 tablet 1  . escitalopram (LEXAPRO) 20 MG tablet TAKE 1 TABLET(20 MG) BY MOUTH DAILY 30 tablet 0  . HYDROcodone-acetaminophen (NORCO/VICODIN) 5-325 MG tablet Take 0.5 tablets by mouth 4 (four) times daily as needed for moderate pain or severe pain. 45 tablet 0  . lisinopril (PRINIVIL,ZESTRIL) 5 MG tablet TAKE 1 TABLET BY MOUTH DAILY 90 tablet 0  . meclizine (ANTIVERT) 25 MG tablet Take 1 tablet (25 mg total) by mouth 3 (three) times daily as needed for dizziness. 30 tablet 0  . metFORMIN (GLUCOPHAGE) 500 MG tablet Take 500 mg by mouth 2 (two) times daily with a meal.    . metFORMIN (GLUCOPHAGE) 500 MG tablet TAKE 2 TABLETS BY MOUTH TWICE DAILY WITH A MEAL 360 tablet 0  . metoprolol tartrate (LOPRESSOR) 50 MG tablet TAKE 1 AND 1/2  TABLETS BY MOUTH TWICE DAILY 270 tablet 1  . nitroGLYCERIN (NITROSTAT) 0.4 MG SL tablet Place 1 tablet (0.4 mg total) under the tongue every 5 (five) minutes as needed. (Patient taking differently: Place 0.4 mg under the tongue every 5 (five) minutes as needed for chest pain. ) 25 tablet 3  . Omega-3 Fatty Acids (FISH OIL PO) Take 1 capsule by mouth daily.     Marland Kitchen omeprazole (PRILOSEC) 40 MG capsule TAKE 1 CAPSULE BY MOUTH DAILY 90 capsule 3  . rOPINIRole (REQUIP) 2 MG tablet TAKE 1 TABLET BY MOUTH EVERY NIGHT AT BEDTIME 30 tablet 5  . sildenafil (VIAGRA) 100 MG tablet TAKE 1 TABLET BY MOUTH EVERY DAY AS NEEDED FOR ERECTILE DYSFUNCTION 10 tablet 0  . sodium chloride (OCEAN) 0.65 % SOLN nasal spray Place 1 spray into both nostrils as needed for congestion.    . traZODone (DESYREL) 50 MG tablet TAKE 2 TABLETS(100 MG) BY MOUTH AT BEDTIME 60 tablet 5  . triamcinolone cream (KENALOG) 0.1 % Apply 1 application topically 2 (two) times daily. (Patient taking differently: Apply 1 application topically daily as needed (for irritation). ) 60 g 1   No current facility-administered medications for this visit.      Past Medical History:  Diagnosis Date  . Arteriosclerotic cardiovascular disease (ASCVD)    Nonobstructive; cath in 2000: 30-40% mid LAD and proximal RCA;normal EF. stress nuclear in 2006-subtle inferoseptal hypoperfusion with reversibility; negative stress EKG; good exercise tolerance  . Asthma   . CHF (congestive heart failure) ([place])   . Colon polyps  30 colon polyps found on first colonoscopy  . Diabetes mellitus without complication ([place])   . Hyperlipidemia   . Hypertension   . Insomnia   . Obstructive sleep apnea 12/2009   01/26/2010 AHI 83/hr  . Paroxysmal atrial fibrillation ([place]) 10/2004   Onset in 10/2004; recurred 09/2008  . PUD (peptic ulcer disease)    1980s  . RLS (restless legs syndrome)   . Sinusitis     Past Surgical History:  Procedure Laterality Date  . CARDIAC  CATHETERIZATION N/A 07/21/2015   Procedure: Left Heart Cath and Coronary Angiography;  Surgeon: Peter M Martinique, MD;  Location: [facility];  Service: Cardiovascular;  Laterality: N/A;  . CARDIAC CATHETERIZATION N/A 07/21/2015   Procedure: Coronary Stent Intervention;  Surgeon: Peter M Martinique, MD;  Location: [facility];  Service: Cardiovascular;  Laterality: N/A;  . COLONOSCOPY N/A 05/19/2014   Dr. Barnie Alderman diverticulosis/moderate external hemorrhoids, >20 simple adenomas. Genetic screening negative.   Marland Kitchen HERNIA REPAIR  1986   Left inguinal  . ROTATOR CUFF REPAIR     Right    Social History   Social History  . Marital status: Married    Spouse name: N/A  . Number of children: 5  . Years of education: N/A   Occupational History  . employed Musician   Social History Main Topics  . Smoking status: Current Every Day Smoker    Packs/day: 1.00    Years: 32.00    Types: Cigarettes    Start date: 07/24/1970  . Smokeless tobacco: Never Used  . Alcohol use No  . Drug use: No  . Sexual activity: Yes    Partners: Female   Other Topics Concern  . Not on file   Social History Narrative  . No narrative on file    Family History  Problem Relation Age of Onset  . Hypertension Mother   . Breast cancer Mother 46  . Heart attack Father   . Diabetes Brother   . Skin cancer Sister 67  . Brain cancer Maternal Uncle   . Cancer Maternal Uncle        NOS  . Breast cancer Cousin        maternal cousin dx <50  . Colon cancer Neg Hx     ROS: no fevers or chills, productive cough, hemoptysis, dysphasia, odynophagia, melena, hematochezia, dysuria, hematuria, rash, seizure activity, orthopnea, PND, pedal edema, claudication. Remaining systems are negative.  Physical Exam: Well-developed obese in no acute distress.  Skin is warm and dry.  HEENT is normal.  Neck is supple.  Chest is clear to auscultation with normal expansion.  Cardiovascular exam is  irregular Abdominal exam nontender or distended. No masses palpated. Extremities show no edema. neuro grossly intact  ECG- atrial fibrillation at a rate of 75. No ST changes. personally reviewed  A/P  1 Permanent atrial fibrillation-plan is for rate control and anticoagulation. Continue metoprolol. Continue apixaban. Check hemoglobin and renal function.  2 coronary artery disease-continue statin. No aspirin given need for anticoagulation. Patient is describing increasing dyspnea on exertion and chest discomfort with exertion relieved with rest similar to those prior to his PCI. He has had no rest symptoms. Plan cardiac catheterization for definitive evaluation. The risks and benefits including myocardial infarction, CVA and death discussed and he agrees to proceed. Hold apixban 2 days prior to procedure and resume after. Hold metformin for 48 hours following procedure. Note approximately 20 minutes spent reviewing chart prior to  patient arrival. Schedule echocardiogram to assess LV function.  3 hyperlipidemia-continue statin. Laboratories from March 1 personally reviewed and showed total cholesterol 138, triglyceride 199 and LDL 69.  4 hypertension-blood pressure is controlled. Continue present medications.  5 obstructive sleep apnea-continue CPAP.  6 tobacco abuse-patient counseled on discontinuing.  Kirk Ruths, MD

## 2017-06-05 NOTE — Patient Instructions (Addendum)
Medication Instructions:  Your physician recommends that you continue on your current medications as directed. Please refer to the Current Medication list given to you today.  Labwork: CBC/BMET/PT/INT/PTT TODAY   Testing/Procedures: Your physician has requested that you have an echocardiogram. Echocardiography is a painless test that uses sound waves to create images of your heart. It provides your doctor with information about the size and shape of your heart and how well your heart's chambers and valves are working. This procedure takes approximately one hour. There are no restrictions for this procedure. [place] [address]  Your physician has requested that you have a cardiac catheterization. Cardiac catheterization is used to diagnose and/or treat various heart conditions. Doctors may recommend this procedure for a number of different reasons. The most common reason is to evaluate chest pain. Chest pain can be a symptom of coronary artery disease (CAD), and cardiac catheterization can show whether plaque is narrowing or blocking your heart's arteries. This procedure is also used to evaluate the valves, as well as measure the blood flow and oxygen levels in different parts of your heart. For further information please visit [email or web address]. Please follow instruction sheet, as given.  A chest x-ray takes a picture of the organs and structures inside the chest, including the heart, lungs, and blood vessels. This test can show several things, including, whether the heart is enlarges; whether fluid is building up in the lungs; and whether pacemaker / defibrillator leads are still in place. [facility]   Follow-Up: Your physician recommends that you schedule a follow-up appointment in: [place]  Your physician recommends that you schedule a follow-up appointment in: [address] [place] [address] [place] [address] Dept: [phone] Loc: [place]  06/05/2017  You are scheduled for a Cardiac Catheterization on Thursday, October 11 with Dr. Harrell Gave End.  1. Please arrive at the [facility] (Main Entrance A) at [facility]: [place], [place] [address] at 5:30 AM (two hours before your procedure to ensure your preparation). Free valet parking service is available.   Special note: Every effort is made to have your procedure done on time. Please understand that emergencies sometimes delay scheduled procedures.  2. Diet: Do not eat or drink anything after midnight prior to your procedure except sips of water to take medications.  3. Labs: TODAY   4. Medication instructions in preparation for your procedure:  HOLD YOUR LISINOPRIL MORNING OF   HOLD ELIQUIS 2 DAYS PRIOR  HOLD YOUR METFORMIN 1 DAY BEFORE AND 2 DAYS AFTER CATH  On the morning of your procedure, take your any morning medicines NOT listed above.  You may use sips of water.  5. Plan for one night stay--bring personal belongings. 6. Bring a current list of your medications and current insurance cards. 7. You MUST have a responsible person to drive you home. 8. Someone MUST be with you the first 24 hours after you arrive home or your discharge will be delayed. 9. Please wear clothes that are easy to get on and off and wear slip-on shoes.  Thank you for allowing [place] to care for you!   -- [facility]

## 2017-06-05 NOTE — Telephone Encounter (Signed)
Appointments for patient scheduled

## 2017-06-06 ENCOUNTER — Telehealth: Payer: Self-pay | Admitting: Cardiology

## 2017-06-06 NOTE — Telephone Encounter (Signed)
-----   Message from Lelon Perla, MD sent at 06/05/2017 12:45 PM EDT ----- No significant abnormality Kirk Ruths

## 2017-06-06 NOTE — Telephone Encounter (Signed)
-----   Message from Lelon Perla, MD sent at 06/05/2017  5:12 PM EDT ----- Pt should fu with his primary care for further WU of mild microcytic anemia Kirk Ruths

## 2017-06-06 NOTE — Telephone Encounter (Signed)
Advised patient of labs and chest xray Labs sent to [facility] and he will follow up

## 2017-06-06 NOTE — Telephone Encounter (Signed)
F/u Message  Pt states he is returning [place] call. Please call back to discuss

## 2017-06-07 ENCOUNTER — Telehealth: Payer: Self-pay

## 2017-06-07 ENCOUNTER — Other Ambulatory Visit: Payer: Self-pay | Admitting: Family Medicine

## 2017-06-07 NOTE — Telephone Encounter (Signed)
Patient contacted pre-catheterization at [facility] scheduled for:  06/08/2017 @ 0730 Verified arrival time and place:  [facility] @ 0530 Confirmed AM meds to be taken pre-cath with sip of water: Take ASA Am of procedure hold lisinopril Last dose Eliquis 10/8 evening dose Last dose Metformin 10/9 evening dose Confirmed patient has responsible person to drive home post procedure and observe patient for 24 hours:  yes Addl concerns:  Confirmed Pt had taken all medications as instructed

## 2017-06-07 NOTE — Telephone Encounter (Signed)
Last seen 05/11/17  

## 2017-06-08 ENCOUNTER — Ambulatory Visit (HOSPITAL_COMMUNITY)
Admission: RE | Admit: 2017-06-08 | Discharge: 2017-06-08 | Disposition: A | Discharge status: Home / Self Care | Payer: 59 | Source: Ambulatory Visit | Attending: Internal Medicine | Admitting: Internal Medicine

## 2017-06-08 ENCOUNTER — Other Ambulatory Visit: Payer: Self-pay

## 2017-06-08 ENCOUNTER — Other Ambulatory Visit: Payer: Self-pay | Admitting: Family Medicine

## 2017-06-08 ENCOUNTER — Encounter (HOSPITAL_COMMUNITY)
Admission: RE | Disposition: A | Discharge status: Home / Self Care | Payer: Self-pay | Source: Ambulatory Visit | Attending: Internal Medicine

## 2017-06-08 DIAGNOSIS — Z8711 Personal history of peptic ulcer disease: Secondary | ICD-10-CM | POA: Diagnosis not present

## 2017-06-08 DIAGNOSIS — G2581 Restless legs syndrome: Secondary | ICD-10-CM | POA: Insufficient documentation

## 2017-06-08 DIAGNOSIS — E119 Type 2 diabetes mellitus without complications: Secondary | ICD-10-CM | POA: Diagnosis not present

## 2017-06-08 DIAGNOSIS — F17210 Nicotine dependence, cigarettes, uncomplicated: Secondary | ICD-10-CM | POA: Insufficient documentation

## 2017-06-08 DIAGNOSIS — Z7984 Long term (current) use of oral hypoglycemic drugs: Secondary | ICD-10-CM | POA: Insufficient documentation

## 2017-06-08 DIAGNOSIS — Z7901 Long term (current) use of anticoagulants: Secondary | ICD-10-CM | POA: Diagnosis not present

## 2017-06-08 DIAGNOSIS — I509 Heart failure, unspecified: Secondary | ICD-10-CM | POA: Insufficient documentation

## 2017-06-08 DIAGNOSIS — E785 Hyperlipidemia, unspecified: Secondary | ICD-10-CM | POA: Diagnosis not present

## 2017-06-08 DIAGNOSIS — G4733 Obstructive sleep apnea (adult) (pediatric): Secondary | ICD-10-CM | POA: Diagnosis not present

## 2017-06-08 DIAGNOSIS — I340 Nonrheumatic mitral (valve) insufficiency: Secondary | ICD-10-CM | POA: Insufficient documentation

## 2017-06-08 DIAGNOSIS — J45909 Unspecified asthma, uncomplicated: Secondary | ICD-10-CM | POA: Diagnosis not present

## 2017-06-08 DIAGNOSIS — Z955 Presence of coronary angioplasty implant and graft: Secondary | ICD-10-CM | POA: Diagnosis not present

## 2017-06-08 DIAGNOSIS — I2584 Coronary atherosclerosis due to calcified coronary lesion: Secondary | ICD-10-CM | POA: Insufficient documentation

## 2017-06-08 DIAGNOSIS — I25118 Atherosclerotic heart disease of native coronary artery with other forms of angina pectoris: Secondary | ICD-10-CM | POA: Diagnosis present

## 2017-06-08 DIAGNOSIS — G4700 Insomnia, unspecified: Secondary | ICD-10-CM | POA: Diagnosis not present

## 2017-06-08 DIAGNOSIS — Z8249 Family history of ischemic heart disease and other diseases of the circulatory system: Secondary | ICD-10-CM | POA: Insufficient documentation

## 2017-06-08 DIAGNOSIS — I25110 Atherosclerotic heart disease of native coronary artery with unstable angina pectoris: Secondary | ICD-10-CM | POA: Diagnosis present

## 2017-06-08 DIAGNOSIS — I110 Hypertensive heart disease with heart failure: Secondary | ICD-10-CM | POA: Insufficient documentation

## 2017-06-08 DIAGNOSIS — I482 Chronic atrial fibrillation: Secondary | ICD-10-CM | POA: Diagnosis not present

## 2017-06-08 DIAGNOSIS — I200 Unstable angina: Secondary | ICD-10-CM | POA: Diagnosis present

## 2017-06-08 HISTORY — PX: LEFT HEART CATH AND CORONARY ANGIOGRAPHY: CATH118249

## 2017-06-08 HISTORY — PX: INTRAVASCULAR PRESSURE WIRE/FFR STUDY: CATH118243

## 2017-06-08 LAB — POCT ACTIVATED CLOTTING TIME
Activated Clotting Time: 219 seconds
Activated Clotting Time: 224 seconds

## 2017-06-08 LAB — GLUCOSE, CAPILLARY
GLUCOSE-CAPILLARY: 111 mg/dL — AB (ref 65–99)
Glucose-Capillary: 151 mg/dL — ABNORMAL HIGH (ref 65–99)

## 2017-06-08 SURGERY — LEFT HEART CATH AND CORONARY ANGIOGRAPHY
Anesthesia: LOCAL

## 2017-06-08 MED ORDER — APIXABAN 5 MG PO TABS: 5.0000 mg | ORAL_TABLET | Freq: Two times a day (BID) | ORAL | 6 refills | Status: DC

## 2017-06-08 MED ORDER — VERAPAMIL HCL 2.5 MG/ML IV SOLN
INTRAVENOUS | Status: AC
  Filled 2017-06-08: qty 2

## 2017-06-08 MED ORDER — SODIUM CHLORIDE 0.9 % WEIGHT BASED INFUSION: 1.0000 mL/kg/h | INTRAVENOUS | Status: DC

## 2017-06-08 MED ORDER — HYDRALAZINE HCL 20 MG/ML IJ SOLN: 5.0000 mg | INTRAMUSCULAR | Status: AC | PRN

## 2017-06-08 MED ORDER — SODIUM CHLORIDE 0.9% FLUSH: 3.0000 mL | Freq: Two times a day (BID) | INTRAVENOUS | Status: DC

## 2017-06-08 MED ORDER — NITROGLYCERIN 1 MG/10 ML FOR IR/CATH LAB
INTRA_ARTERIAL | Status: DC | PRN
  Administered 2017-06-08: 100 ug via INTRACORONARY

## 2017-06-08 MED ORDER — FENTANYL CITRATE (PF) 100 MCG/2ML IJ SOLN
INTRAMUSCULAR | Status: AC
  Filled 2017-06-08: qty 2

## 2017-06-08 MED ORDER — LABETALOL HCL 5 MG/ML IV SOLN: 10.0000 mg | INTRAVENOUS | Status: AC | PRN

## 2017-06-08 MED ORDER — NITROGLYCERIN 0.4 MG SL SUBL
SUBLINGUAL_TABLET | SUBLINGUAL | Status: DC | PRN
  Administered 2017-06-08: .4 mg via SUBLINGUAL

## 2017-06-08 MED ORDER — SODIUM CHLORIDE 0.9 % IV SOLN: INTRAVENOUS | Status: AC

## 2017-06-08 MED ORDER — ADENOSINE 12 MG/4ML IV SOLN
INTRAVENOUS | Status: AC
  Filled 2017-06-08: qty 16

## 2017-06-08 MED ORDER — HEPARIN SODIUM (PORCINE) 1000 UNIT/ML IJ SOLN
INTRAMUSCULAR | Status: AC
  Filled 2017-06-08: qty 1

## 2017-06-08 MED ORDER — IOPAMIDOL (ISOVUE-370) INJECTION 76%
INTRAVENOUS | Status: AC
  Filled 2017-06-08: qty 50

## 2017-06-08 MED ORDER — MIDAZOLAM HCL 2 MG/2ML IJ SOLN
INTRAMUSCULAR | Status: AC
  Filled 2017-06-08: qty 2

## 2017-06-08 MED ORDER — MIDAZOLAM HCL 2 MG/2ML IJ SOLN
INTRAMUSCULAR | Status: DC | PRN
  Administered 2017-06-08 (×3): 1 mg via INTRAVENOUS

## 2017-06-08 MED ORDER — MORPHINE SULFATE (PF) 10 MG/ML IV SOLN
INTRAVENOUS | Status: AC
  Filled 2017-06-08: qty 1

## 2017-06-08 MED ORDER — HEPARIN (PORCINE) IN NACL 2-0.9 UNIT/ML-% IJ SOLN
INTRAMUSCULAR | Status: AC
  Filled 2017-06-08: qty 1000

## 2017-06-08 MED ORDER — NITROGLYCERIN 0.4 MG SL SUBL
SUBLINGUAL_TABLET | SUBLINGUAL | Status: AC
  Filled 2017-06-08: qty 1

## 2017-06-08 MED ORDER — SODIUM CHLORIDE 0.9 % WEIGHT BASED INFUSION
3.0000 mL/kg/h | INTRAVENOUS | Status: AC
  Administered 2017-06-08: 3 mL/kg/h via INTRAVENOUS

## 2017-06-08 MED ORDER — FENTANYL CITRATE (PF) 100 MCG/2ML IJ SOLN
INTRAMUSCULAR | Status: DC | PRN
  Administered 2017-06-08 (×2): 25 ug via INTRAVENOUS
  Administered 2017-06-08: 50 ug via INTRAVENOUS

## 2017-06-08 MED ORDER — SODIUM CHLORIDE 0.9 % IV SOLN: 250.0000 mL | INTRAVENOUS | Status: DC | PRN

## 2017-06-08 MED ORDER — IOPAMIDOL (ISOVUE-370) INJECTION 76%
INTRAVENOUS | Status: DC | PRN
  Administered 2017-06-08: 155 mL via INTRA_ARTERIAL

## 2017-06-08 MED ORDER — ASPIRIN 81 MG PO CHEW: 81.0000 mg | CHEWABLE_TABLET | ORAL | Status: DC

## 2017-06-08 MED ORDER — LIDOCAINE HCL 2 % IJ SOLN
INTRAMUSCULAR | Status: AC
  Filled 2017-06-08: qty 10

## 2017-06-08 MED ORDER — ADENOSINE (DIAGNOSTIC) 140MCG/KG/MIN
INTRAVENOUS | Status: DC | PRN
  Administered 2017-06-08: 140 ug/kg/min via INTRAVENOUS

## 2017-06-08 MED ORDER — HEPARIN (PORCINE) IN NACL 2-0.9 UNIT/ML-% IJ SOLN
INTRAMUSCULAR | Status: DC | PRN
  Administered 2017-06-08: 10 mL via INTRA_ARTERIAL

## 2017-06-08 MED ORDER — IOPAMIDOL (ISOVUE-370) INJECTION 76%
INTRAVENOUS | Status: AC
  Filled 2017-06-08: qty 100

## 2017-06-08 MED ORDER — HEPARIN SODIUM (PORCINE) 1000 UNIT/ML IJ SOLN
INTRAMUSCULAR | Status: DC | PRN
  Administered 2017-06-08: 5000 [IU] via INTRAVENOUS
  Administered 2017-06-08: 3000 [IU] via INTRAVENOUS
  Administered 2017-06-08: 5000 [IU] via INTRAVENOUS

## 2017-06-08 MED ORDER — METFORMIN HCL 500 MG PO TABS: 500.0000 mg | ORAL_TABLET | Freq: Two times a day (BID) | ORAL | 0 refills | Status: DC

## 2017-06-08 MED ORDER — LIDOCAINE HCL 2 % IJ SOLN
INTRAMUSCULAR | Status: DC | PRN
  Administered 2017-06-08: 2 mL

## 2017-06-08 MED ORDER — SODIUM CHLORIDE 0.9% FLUSH: 3.0000 mL | INTRAVENOUS | Status: DC | PRN

## 2017-06-08 MED ORDER — NITROGLYCERIN 1 MG/10 ML FOR IR/CATH LAB
INTRA_ARTERIAL | Status: AC
  Filled 2017-06-08: qty 10

## 2017-06-08 MED ORDER — MORPHINE SULFATE (PF) 10 MG/ML IV SOLN
INTRAVENOUS | Status: DC | PRN
  Administered 2017-06-08: 2 mg via INTRAVENOUS

## 2017-06-08 MED ORDER — HEPARIN (PORCINE) IN NACL 2-0.9 UNIT/ML-% IJ SOLN
INTRAMUSCULAR | Status: AC | PRN
  Administered 2017-06-08: 1000 mL

## 2017-06-08 SURGICAL SUPPLY — 16 items
CATH EXPO 5FR ANG PIGTAIL 145 (CATHETERS) ×3 IMPLANT
CATH INFINITI 5 FR JL3.5 (CATHETERS) ×6 IMPLANT
CATH INFINITI JR4 5F (CATHETERS) ×6 IMPLANT
CATH LAUNCHER 6FR EBU3.5 (CATHETERS) ×3 IMPLANT
DEVICE RAD COMP TR BAND LRG (VASCULAR PRODUCTS) ×3 IMPLANT
GLIDESHEATH SLEND SS 6F .021 (SHEATH) ×3 IMPLANT
GUIDEWIRE INQWIRE 1.5J.035X260 (WIRE) ×2 IMPLANT
GUIDEWIRE PRESSURE COMET II (WIRE) ×3 IMPLANT
INQWIRE 1.5J .035X260CM (WIRE) ×3
KIT ESSENTIALS PG (KITS) ×3 IMPLANT
KIT HEART LEFT (KITS) ×3 IMPLANT
PACK CARDIAC CATHETERIZATION (CUSTOM PROCEDURE TRAY) ×3 IMPLANT
SYR MEDRAD MARK V 150ML (SYRINGE) ×3 IMPLANT
TRANSDUCER W/STOPCOCK (MISCELLANEOUS) ×3 IMPLANT
TUBING CIL FLEX 10 FLL-RA (TUBING) ×3 IMPLANT
WIRE HI TORQ VERSACORE-J 145CM (WIRE) ×3 IMPLANT

## 2017-06-08 NOTE — Brief Op Note (Signed)
Brief Cardiac Catheterization Note  Date: 06/08/2017 Time: 9:34 AM  PATIENT:  Shaun [name]  61 y.o. male  PRE-OPERATIVE DIAGNOSIS:  CAD  POST-OPERATIVE DIAGNOSIS:  Same  PROCEDURE:  Procedure(s) with comments: LEFT HEART CATH AND CORONARY ANGIOGRAPHY (N/A) INTRAVASCULAR PRESSURE WIRE/FFR STUDY (Left) - LAD and CFX  SURGEON:  Surgeon(s) and Role:    * Johney Perotti, MD - Primary  FINDINGS: 1. Non-obstructive CAD, including 50% mid LAD and mid LCx lesions that are not hemodynamically significant, as well as 40% proximal/mid RCA disease. 2. Patent stent in distal LCx. 3. Normal left ventricular filling pressure and contraction. 4. Significant chest pain occurred during diagnostic catheterization and FFR. Repeat coronary angiography did not show any change in non-obstructive CAD. Serial EKGs showed a-fib without acute ST/T changes.  RECOMMENDATIONS: 1. Medical management. 2. If acute chest pain persists in 30-60 minutes, consider CTA chest to exclude other etiology for chest pain.  Nelva Bush, MD [facility] Pager: [phone]

## 2017-06-08 NOTE — H&P (View-Only) (Signed)
HPI: 61 year old male for follow-up of history of permanent atrial fibrillation, coronary artery disease, diabetes mellitus and hypertension. Patient is followed by Dr. Harl Bowie. Patient is status post PCI of his circumflex in November 2016. He otherwise had nonobstructive disease. Echocardiogram November 2016 showed normal LV systolic function, moderate left ventricular hypertrophy, mild mitral regurgitation and mild left atrial enlargement. Patient was seen in atrial fibrillation clinic in June 2018 to see if there are options at restoring sinus rhythm. Given long-standing atrial fibrillation and left atrial enlargement it was felt that rate control and anticoagulation indicated. Since last seen,  patient notes increasing dyspnea on exertion for one month. He has a mild chest discomfort with exertion relieved with rest. No palpitations or syncope. No pedal edema. Symptoms are similar to those prior to his previous PCI.  Current Outpatient Prescriptions  Medication Sig Dispense Refill  . apixaban (ELIQUIS) 5 MG TABS tablet Take 1 tablet (5 mg total) by mouth 2 (two) times daily. 60 tablet 6  . atorvastatin (LIPITOR) 80 MG tablet TAKE 1 TABLET BY MOUTH EVERY DAY 90 tablet 1  . escitalopram (LEXAPRO) 20 MG tablet TAKE 1 TABLET(20 MG) BY MOUTH DAILY 30 tablet 0  . HYDROcodone-acetaminophen (NORCO/VICODIN) 5-325 MG tablet Take 0.5 tablets by mouth 4 (four) times daily as needed for moderate pain or severe pain. 45 tablet 0  . lisinopril (PRINIVIL,ZESTRIL) 5 MG tablet TAKE 1 TABLET BY MOUTH DAILY 90 tablet 0  . meclizine (ANTIVERT) 25 MG tablet Take 1 tablet (25 mg total) by mouth 3 (three) times daily as needed for dizziness. 30 tablet 0  . metFORMIN (GLUCOPHAGE) 500 MG tablet Take 500 mg by mouth 2 (two) times daily with a meal.    . metFORMIN (GLUCOPHAGE) 500 MG tablet TAKE 2 TABLETS BY MOUTH TWICE DAILY WITH A MEAL 360 tablet 0  . metoprolol tartrate (LOPRESSOR) 50 MG tablet TAKE 1 AND 1/2  TABLETS BY MOUTH TWICE DAILY 270 tablet 1  . nitroGLYCERIN (NITROSTAT) 0.4 MG SL tablet Place 1 tablet (0.4 mg total) under the tongue every 5 (five) minutes as needed. (Patient taking differently: Place 0.4 mg under the tongue every 5 (five) minutes as needed for chest pain. ) 25 tablet 3  . Omega-3 Fatty Acids (FISH OIL PO) Take 1 capsule by mouth daily.     Marland Kitchen omeprazole (PRILOSEC) 40 MG capsule TAKE 1 CAPSULE BY MOUTH DAILY 90 capsule 3  . rOPINIRole (REQUIP) 2 MG tablet TAKE 1 TABLET BY MOUTH EVERY NIGHT AT BEDTIME 30 tablet 5  . sildenafil (VIAGRA) 100 MG tablet TAKE 1 TABLET BY MOUTH EVERY DAY AS NEEDED FOR ERECTILE DYSFUNCTION 10 tablet 0  . sodium chloride (OCEAN) 0.65 % SOLN nasal spray Place 1 spray into both nostrils as needed for congestion.    . traZODone (DESYREL) 50 MG tablet TAKE 2 TABLETS(100 MG) BY MOUTH AT BEDTIME 60 tablet 5  . triamcinolone cream (KENALOG) 0.1 % Apply 1 application topically 2 (two) times daily. (Patient taking differently: Apply 1 application topically daily as needed (for irritation). ) 60 g 1   No current facility-administered medications for this visit.      Past Medical History:  Diagnosis Date  . Arteriosclerotic cardiovascular disease (ASCVD)    Nonobstructive; cath in 2000: 30-40% mid LAD and proximal RCA;normal EF. stress nuclear in 2006-subtle inferoseptal hypoperfusion with reversibility; negative stress EKG; good exercise tolerance  . Asthma   . CHF (congestive heart failure) ([place])   . Colon polyps  30 colon polyps found on first colonoscopy  . Diabetes mellitus without complication ([place])   . Hyperlipidemia   . Hypertension   . Insomnia   . Obstructive sleep apnea 12/2009   01/26/2010 AHI 83/hr  . Paroxysmal atrial fibrillation ([place]) 10/2004   Onset in 10/2004; recurred 09/2008  . PUD (peptic ulcer disease)    1980s  . RLS (restless legs syndrome)   . Sinusitis     Past Surgical History:  Procedure Laterality Date  . CARDIAC  CATHETERIZATION N/A 07/21/2015   Procedure: Left Heart Cath and Coronary Angiography;  Surgeon: Peter M Martinique, MD;  Location: [place] [facility];  Service: Cardiovascular;  Laterality: N/A;  . CARDIAC CATHETERIZATION N/A 07/21/2015   Procedure: Coronary Stent Intervention;  Surgeon: Peter M Martinique, MD;  Location: [place] [facility];  Service: Cardiovascular;  Laterality: N/A;  . COLONOSCOPY N/A 05/19/2014   Dr. Barnie Alderman diverticulosis/moderate external hemorrhoids, >20 simple adenomas. Genetic screening negative.   Marland Kitchen HERNIA REPAIR  1986   Left inguinal  . ROTATOR CUFF REPAIR     Right    Social History   Social History  . Marital status: Married    Spouse name: N/A  . Number of children: 5  . Years of education: N/A   Occupational History  . employed Musician   Social History Main Topics  . Smoking status: Current Every Day Smoker    Packs/day: 1.00    Years: 32.00    Types: Cigarettes    Start date: 07/24/1970  . Smokeless tobacco: Never Used  . Alcohol use No  . Drug use: No  . Sexual activity: Yes    Partners: Female   Other Topics Concern  . Not on file   Social History Narrative  . No narrative on file    Family History  Problem Relation Age of Onset  . Hypertension Mother   . Breast cancer Mother 9  . Heart attack Father   . Diabetes Brother   . Skin cancer Sister 58  . Brain cancer Maternal Uncle   . Cancer Maternal Uncle        NOS  . Breast cancer Cousin        maternal cousin dx <50  . Colon cancer Neg Hx     ROS: no fevers or chills, productive cough, hemoptysis, dysphasia, odynophagia, melena, hematochezia, dysuria, hematuria, rash, seizure activity, orthopnea, PND, pedal edema, claudication. Remaining systems are negative.  Physical Exam: Well-developed obese in no acute distress.  Skin is warm and dry.  HEENT is normal.  Neck is supple.  Chest is clear to auscultation with normal expansion.  Cardiovascular exam is  irregular Abdominal exam nontender or distended. No masses palpated. Extremities show no edema. neuro grossly intact  ECG- atrial fibrillation at a rate of 75. No ST changes. personally reviewed  A/P  1 Permanent atrial fibrillation-plan is for rate control and anticoagulation. Continue metoprolol. Continue apixaban. Check hemoglobin and renal function.  2 coronary artery disease-continue statin. No aspirin given need for anticoagulation. Patient is describing increasing dyspnea on exertion and chest discomfort with exertion relieved with rest similar to those prior to his PCI. He has had no rest symptoms. Plan cardiac catheterization for definitive evaluation. The risks and benefits including myocardial infarction, CVA and death discussed and he agrees to proceed. Hold apixban 2 days prior to procedure and resume after. Hold metformin for 48 hours following procedure. Note approximately 20 minutes spent reviewing chart prior to  patient arrival. Schedule echocardiogram to assess LV function.  3 hyperlipidemia-continue statin. Laboratories from March 1 personally reviewed and showed total cholesterol 138, triglyceride 199 and LDL 69.  4 hypertension-blood pressure is controlled. Continue present medications.  5 obstructive sleep apnea-continue CPAP.  6 tobacco abuse-patient counseled on discontinuing.  Kirk Ruths, MD

## 2017-06-08 NOTE — Interval H&P Note (Signed)
History and Physical Interval Note:  06/08/2017 6:33 AM  Shaun [name]  has presented today for cardiac catheterization, with the diagnosis of accelerating angina. The various methods of treatment have been discussed with the patient and family. After consideration of risks, benefits and other options for treatment, the patient has consented to  Procedure(s): LEFT HEART CATH AND CORONARY ANGIOGRAPHY (N/A) as a surgical intervention .  The patient's history has been reviewed, patient examined, no change in status, stable for surgery.  I have reviewed the patient's chart and labs.  Questions were answered to the patient's satisfaction.    Cath Lab Visit (complete for each Cath Lab visit)  Clinical Evaluation Leading to the Procedure:   ACS: No.  Non-ACS:    Anginal Classification: CCS III (progressive over last few months)  Anti-ischemic medical therapy: Minimal Therapy (1 class of medications)  Non-Invasive Test Results: No non-invasive testing performed  Prior CABG: No previous CABG  Shaun [name]

## 2017-06-08 NOTE — Discharge Instructions (Signed)
HOLD METFORMIN FOR 48 HOURS. MAY RESUME SUN MORNING.  Radial Site Care Refer to this sheet in the next few weeks. These instructions provide you with information about caring for yourself after your procedure. Your health care provider may also give you more specific instructions. Your treatment has been planned according to current medical practices, but problems sometimes occur. Call your health care provider if you have any problems or questions after your procedure. What can I expect after the procedure? After your procedure, it is typical to have the following:  Bruising at the radial site that usually fades within 1-2 weeks.  Blood collecting in the tissue (hematoma) that may be painful to the touch. It should usually decrease in size and tenderness within 1-2 weeks.  Follow these instructions at home:  Take medicines only as directed by your health care provider.  You may shower 24-48 hours after the procedure or as directed by your health care provider. Remove the bandage (dressing) and gently wash the site with plain soap and water. Pat the area dry with a clean towel. Do not rub the site, because this may cause bleeding.  Do not take baths, swim, or use a hot tub until your health care provider approves.  Check your insertion site every day for redness, swelling, or drainage.  Do not apply powder or lotion to the site.  Do not flex or bend the affected arm for 24 hours or as directed by your health care provider.  Do not push or pull heavy objects with the affected arm for 24 hours or as directed by your health care provider.  Do not lift over 10 lb (4.5 kg) for 5 days after your procedure or as directed by your health care provider.  Ask your health care provider when it is okay to: ? Return to work or school. ? Resume usual physical activities or sports. ? Resume sexual activity.  Do not drive home if you are discharged the same day as the procedure. Have someone else  drive you.  You may drive 24 hours after the procedure unless otherwise instructed by your health care provider.  Do not operate machinery or power tools for 24 hours after the procedure.  If your procedure was done as an outpatient procedure, which means that you went home the same day as your procedure, a responsible adult should be with you for the first 24 hours after you arrive home.  Keep all follow-up visits as directed by your health care provider. This is important. Contact a health care provider if:  You have a fever.  You have chills.  You have increased bleeding from the radial site. Hold pressure on the site. Get help right away if:  You have unusual pain at the radial site.  You have redness, warmth, or swelling at the radial site.  You have drainage (other than a small amount of blood on the dressing) from the radial site.  The radial site is bleeding, and the bleeding does not stop after 30 minutes of holding steady pressure on the site.  Your arm or hand becomes pale, cool, tingly, or numb. This information is not intended to replace advice given to you by your health care provider. Make sure you discuss any questions you have with your health care provider. Document Released: 09/17/2010 Document Revised: 01/21/2016 Document Reviewed: 03/03/2014 Elsevier Interactive Patient Education  2018 [employer].

## 2017-06-09 ENCOUNTER — Encounter (HOSPITAL_COMMUNITY): Payer: Self-pay | Admitting: Internal Medicine

## 2017-06-12 ENCOUNTER — Emergency Department (HOSPITAL_COMMUNITY)
Admission: EM | Admit: 2017-06-12 | Discharge: 2017-06-12 | Disposition: A | Discharge status: Home / Self Care | Payer: 59 | Attending: Emergency Medicine | Admitting: Emergency Medicine

## 2017-06-12 ENCOUNTER — Encounter (HOSPITAL_COMMUNITY): Payer: Self-pay | Admitting: Emergency Medicine

## 2017-06-12 ENCOUNTER — Telehealth: Payer: Self-pay

## 2017-06-12 ENCOUNTER — Emergency Department (HOSPITAL_COMMUNITY): Payer: 59

## 2017-06-12 DIAGNOSIS — F17210 Nicotine dependence, cigarettes, uncomplicated: Secondary | ICD-10-CM | POA: Insufficient documentation

## 2017-06-12 DIAGNOSIS — I509 Heart failure, unspecified: Secondary | ICD-10-CM

## 2017-06-12 DIAGNOSIS — R0609 Other forms of dyspnea: Secondary | ICD-10-CM

## 2017-06-12 DIAGNOSIS — E119 Type 2 diabetes mellitus without complications: Secondary | ICD-10-CM | POA: Diagnosis not present

## 2017-06-12 DIAGNOSIS — I10 Essential (primary) hypertension: Secondary | ICD-10-CM | POA: Insufficient documentation

## 2017-06-12 DIAGNOSIS — I110 Hypertensive heart disease with heart failure: Secondary | ICD-10-CM | POA: Diagnosis not present

## 2017-06-12 DIAGNOSIS — R0600 Dyspnea, unspecified: Secondary | ICD-10-CM | POA: Diagnosis not present

## 2017-06-12 DIAGNOSIS — Z79899 Other long term (current) drug therapy: Secondary | ICD-10-CM | POA: Diagnosis not present

## 2017-06-12 DIAGNOSIS — J449 Chronic obstructive pulmonary disease, unspecified: Secondary | ICD-10-CM | POA: Diagnosis not present

## 2017-06-12 DIAGNOSIS — R0602 Shortness of breath: Secondary | ICD-10-CM | POA: Diagnosis not present

## 2017-06-12 LAB — CBC
HEMATOCRIT: 33.7 % — AB (ref 39.0–52.0)
HEMOGLOBIN: 10.2 g/dL — AB (ref 13.0–17.0)
MCH: 22.3 pg — ABNORMAL LOW (ref 26.0–34.0)
MCHC: 30.3 g/dL (ref 30.0–36.0)
MCV: 73.6 fL — ABNORMAL LOW (ref 78.0–100.0)
Platelets: 179 10*3/uL (ref 150–400)
RBC: 4.58 MIL/uL (ref 4.22–5.81)
RDW: 17.2 % — ABNORMAL HIGH (ref 11.5–15.5)
WBC: 8.7 10*3/uL (ref 4.0–10.5)

## 2017-06-12 LAB — BASIC METABOLIC PANEL
ANION GAP: 9 (ref 5–15)
BUN: 13 mg/dL (ref 6–20)
CO2: 23 mmol/L (ref 22–32)
Calcium: 8.7 mg/dL — ABNORMAL LOW (ref 8.9–10.3)
Chloride: 107 mmol/L (ref 101–111)
Creatinine, Ser: 0.91 mg/dL (ref 0.61–1.24)
GLUCOSE: 112 mg/dL — AB (ref 65–99)
POTASSIUM: 3.7 mmol/L (ref 3.5–5.1)
Sodium: 139 mmol/L (ref 135–145)

## 2017-06-12 LAB — I-STAT TROPONIN, ED: TROPONIN I, POC: 0 ng/mL (ref 0.00–0.08)

## 2017-06-12 LAB — BRAIN NATRIURETIC PEPTIDE: B NATRIURETIC PEPTIDE 5: 109.8 pg/mL — AB (ref 0.0–100.0)

## 2017-06-12 MED ORDER — FUROSEMIDE 20 MG PO TABS: 20.0000 mg | ORAL_TABLET | Freq: Every day | ORAL | 0 refills | Status: DC

## 2017-06-12 MED ORDER — POTASSIUM CHLORIDE CRYS ER 20 MEQ PO TBCR: 20.0000 meq | EXTENDED_RELEASE_TABLET | Freq: Every day | ORAL | 0 refills | Status: DC

## 2017-06-12 MED ORDER — ROPINIROLE HCL 1 MG PO TABS
2.0000 mg | ORAL_TABLET | Freq: Once | ORAL | Status: AC
  Administered 2017-06-12: 2 mg via ORAL
  Filled 2017-06-12: qty 2

## 2017-06-12 MED ORDER — POTASSIUM CHLORIDE CRYS ER 20 MEQ PO TBCR
40.0000 meq | EXTENDED_RELEASE_TABLET | Freq: Once | ORAL | Status: AC
  Administered 2017-06-12: 40 meq via ORAL
  Filled 2017-06-12: qty 2

## 2017-06-12 MED ORDER — FUROSEMIDE 10 MG/ML IJ SOLN
40.0000 mg | INTRAMUSCULAR | Status: AC
  Administered 2017-06-12: 40 mg via INTRAVENOUS
  Filled 2017-06-12: qty 4

## 2017-06-12 NOTE — Telephone Encounter (Signed)
ok 

## 2017-06-12 NOTE — ED Notes (Signed)
Cardiology at bedside.

## 2017-06-12 NOTE — Progress Notes (Signed)
Cardiology Overnight Fellow Note  Patient seen in ED after presenting with shortness of breath, abdominal distention, and bilateral lower extremity edema worsening in the past few days. Had a cath earlier this week which was negative for obstructive disease. Denies orthopnea (sleeps with CPAP mask). No PND. No history of heart failure symptoms. Takes chlorthalidone PRN for abdominal distention. Thinks he may have been eating more sodium lately.   On exam, patient has JVP elevation to ~12 cm H2O, bilateral crackles at the lung bases, abdominal distention, and pitting edema to the knee bilaterally. His weight is up 35 pounds in the past year.   Patient expressed preference against being admitted. Has an echo scheduled for tomorrow. I recommended that patient gets 1 dose IV lasix with 40 mEq of K while in the ED. Also recommended he is given a 14 day prescription for oral lasix 20mg  daily and oral potassium 20 mEq daily. He should return for a BMP on Friday of this week. He stated that he has follow up with his cardiologist (cc'd on this note) within 1 to 2 weeks, however I do not see this appointment in [place].   Marcie Mowers, MD Cardiology Fellow, PGY-5

## 2017-06-12 NOTE — Telephone Encounter (Signed)
Patient called stating he had a heart cath preformed by Dr.End on Thursday last week, now experiencing fluid on stomach and ankles and some SOB upon exertion.He spoke with the [facility] and they told him to call PCP.I spoke w Dr. Nicki Reaper he had me tell the pt to have someone take pt to the ED or call 911 could be developing CHF. Pt aware.

## 2017-06-12 NOTE — ED Notes (Signed)
Daughter up to [facility] asking about wait time, they've been here over 5h. Reviewed Lobby and explained more rooms will be open soon, due to Admits going upstairs. VS ok. Explained Acuity 2 and patient will been getting room soon.

## 2017-06-12 NOTE — ED Notes (Signed)
Pt up to restroom. Steady gait. VSS. Family at bedside.

## 2017-06-12 NOTE — ED Notes (Signed)
Pts 02 dropped to 89 while ambulating. Pt walked independently with a steady gait.

## 2017-06-12 NOTE — ED Triage Notes (Signed)
Pt states he had had a worsening of sob and swelling for the last 2 days, pt had a clean cardiac cath on Thursday and went back to work today. Pt has a dry non productive cough with exertional sob.

## 2017-06-12 NOTE — ED Notes (Signed)
Patient denies pain and is resting comfortably.  

## 2017-06-12 NOTE — ED Provider Notes (Signed)
[facility] EMERGENCY DEPARTMENT Provider Note   CSN: [id number] Arrival date & time: 06/12/17  2024     History   Chief Complaint Chief Complaint  Patient presents with  . Shortness of Breath    HPI Shaun [name] is a 61 y.o. male.  The history is provided by the patient and medical records.    61 year old male with history ofA. Fib on [place] with, coronary artery disease with recent catheterization, depression, COPD, hypertension, hyperlipidemia, diabetes, presenting to the ED with shortness of breath.  Patient states he had a catheterization on 06/05/2017 with Dr. Saunders Revel. States this went well, showed mostly nonobstructive disease, treated medically. States he was doing well until yesterday when he became increasingly more short of breath. States this occurs with exertional activity. He does not have any exertional pain. Does report a dry cough but denies any fever or chills. Has not had any diaphoresis, nausea, or vomiting. States he does feel like he is starting to retain fluid in his abdomen and in his lower legs. He was prescribed chlorthalidone to take as needed for extremity edema, however has not taken in the past 2 days. States he did return to work today and had a hard time with this. He did call the cardiologist's office who recommended he be evaluated in the ED with concern for development of CHF. He has no history of CHF. His most recent echo was in 2016 with estimated 60-65% EF. He is scheduled for repeat echocardiogram tomorrow.  LHC on 06/05/17 FINDINGS: 1. Non-obstructive CAD, including 50% mid LAD and mid LCx lesions that are not hemodynamically significant, as well as 40% proximal/mid RCA disease. 2. Patent stent in distal LCx. 3. Normal left ventricular filling pressure and contraction. 4. Significant chest pain occurred during diagnostic catheterization and FFR. Repeat coronary angiography did not show any change in non-obstructive CAD. Serial  EKGs showed a-fib without acute ST/T changes.  RECOMMENDATIONS: 1. Medical management. 2. If acute chest pain persists in 30-60 minutes, consider CTA chest to exclude other etiology for chest pain.  Past Medical History:  Diagnosis Date  . Arteriosclerotic cardiovascular disease (ASCVD)    Nonobstructive; cath in 2000: 30-40% mid LAD and proximal RCA;normal EF. stress nuclear in 2006-subtle inferoseptal hypoperfusion with reversibility; negative stress EKG; good exercise tolerance  . Asthma   . CHF (congestive heart failure) ([place])   . Colon polyps    30 colon polyps found on first colonoscopy  . Diabetes mellitus without complication ([place])   . Hyperlipidemia   . Hypertension   . Insomnia   . Obstructive sleep apnea 12/2009   01/26/2010 AHI 83/hr  . Paroxysmal atrial fibrillation ([place]) 10/2004   Onset in 10/2004; recurred 09/2008  . PUD (peptic ulcer disease)    1980s  . RLS (restless legs syndrome)   . Sinusitis     Patient Active Problem List   Diagnosis Date Noted  . Chronic anticoagulation 01/27/2017  . Depression 09/24/2015  . COPD (chronic obstructive pulmonary disease) ([place]) 07/20/2015  . Accelerating angina ([place]) 07/19/2015  . Acute chest pain 07/19/2015  . Genetic testing 09/09/2014  . History of colonic polyps 08/11/2014  . Colon polyps   . Encounter for screening colonoscopy 04/30/2014  . Chest pain 08/30/2013  . Non-insulin treated type 2 diabetes mellitus ([place]) 02/09/2011  . CAD S/P percutaneous coronary angioplasty   . Tobacco abuse, in remission   . Hypertension   . Hyperlipidemia LDL goal <100 02/23/2010  . Atrial fibrillation ([place])  02/23/2010  . Obstructive sleep apnea 12/27/2009    Past Surgical History:  Procedure Laterality Date  . CARDIAC CATHETERIZATION N/A 07/21/2015   Procedure: Left Heart Cath and Coronary Angiography;  Surgeon: Peter M Martinique, MD;  Location: [facility];  Service: Cardiovascular;  Laterality: N/A;  . CARDIAC  CATHETERIZATION N/A 07/21/2015   Procedure: Coronary Stent Intervention;  Surgeon: Peter M Martinique, MD;  Location: [facility];  Service: Cardiovascular;  Laterality: N/A;  . COLONOSCOPY N/A 05/19/2014   Dr. Barnie Alderman diverticulosis/moderate external hemorrhoids, >20 simple adenomas. Genetic screening negative.   Marland Kitchen HERNIA REPAIR  1986   Left inguinal  . INTRAVASCULAR PRESSURE WIRE/FFR STUDY Left 06/08/2017   Procedure: INTRAVASCULAR PRESSURE WIRE/FFR STUDY;  Surgeon: Nelva Bush, MD;  Location: [facility];  Service: Cardiovascular;  Laterality: Left;  LAD and CFX  . LEFT HEART CATH AND CORONARY ANGIOGRAPHY N/A 06/08/2017   Procedure: LEFT HEART CATH AND CORONARY ANGIOGRAPHY;  Surgeon: Nelva Bush, MD;  Location: [facility];  Service: Cardiovascular;  Laterality: N/A;  . ROTATOR CUFF REPAIR     Right       Home Medications    Prior to Admission medications   Medication Sig Start Date End Date Taking? Authorizing Provider  albuterol (PROVENTIL HFA;VENTOLIN HFA) 108 (90 Base) MCG/ACT inhaler Inhale 2 puffs into the lungs every 6 (six) hours as needed for wheezing or shortness of breath.    [provider]  apixaban (ELIQUIS) 5 MG TABS tablet Take 1 tablet (5 mg total) by mouth 2 (two) times daily. 06/09/17   End, Harrell Gave, MD  atorvastatin (LIPITOR) 80 MG tablet TAKE 1 TABLET BY MOUTH EVERY DAY Patient taking differently: TAKE 1 TABLET BY MOUTH EVERY EVENING 06/01/17   Mikey Kirschner, MD  escitalopram (LEXAPRO) 20 MG tablet TAKE 1 TABLET(20 MG) BY MOUTH DAILY 06/07/17   Mikey Kirschner, MD  HYDROcodone-acetaminophen (NORCO/VICODIN) 5-325 MG tablet Take 0.5 tablets by mouth 4 (four) times daily as needed for moderate pain or severe pain. 05/11/17   Mikey Kirschner, MD  lisinopril (PRINIVIL,ZESTRIL) 5 MG tablet TAKE 1 TABLET BY MOUTH DAILY 05/03/17   Arnoldo Lenis, MD  meclizine (ANTIVERT) 25 MG tablet Take 1 tablet (25 mg total) by mouth 3  (three) times daily as needed for dizziness. 10/18/16   Daleen Bo, MD  metFORMIN (GLUCOPHAGE) 500 MG tablet Take 1 tablet (500 mg total) by mouth 2 (two) times daily with a meal. 06/11/17   End, Harrell Gave, MD  metoprolol tartrate (LOPRESSOR) 50 MG tablet TAKE 1 AND 1/2 TABLETS BY MOUTH TWICE DAILY 01/26/17   Arnoldo Lenis, MD  nitroGLYCERIN (NITROSTAT) 0.4 MG SL tablet Place 1 tablet (0.4 mg total) under the tongue every 5 (five) minutes as needed. Patient taking differently: Place 0.4 mg under the tongue every 5 (five) minutes as needed for chest pain.  06/30/15   Lendon Colonel, NP  Omega-3 Fatty Acids (FISH OIL PO) Take 1 capsule by mouth daily.     [provider]  omeprazole (PRILOSEC) 40 MG capsule TAKE 1 CAPSULE BY MOUTH DAILY 02/09/17   Mikey Kirschner, MD  omeprazole (PRILOSEC) 40 MG capsule TAKE 1 CAPSULE BY MOUTH DAILY 06/08/17   Kathyrn Drown, MD  rOPINIRole (REQUIP) 2 MG tablet TAKE 1 TABLET BY MOUTH EVERY NIGHT AT BEDTIME 05/22/17   Luking, Elayne Snare, MD  sildenafil (VIAGRA) 100 MG tablet TAKE 1 TABLET BY MOUTH EVERY DAY AS NEEDED FOR ERECTILE  DYSFUNCTION Patient taking differently: Take 50 mg by mouth as needed for erectile dysfunction. TAKE 1 TABLET BY MOUTH EVERY DAY AS NEEDED FOR ERECTILE DYSFUNCTION 01/27/17   Erlene Quan, PA-C  sodium chloride (OCEAN) 0.65 % SOLN nasal spray Place 1 spray into both nostrils as needed for congestion.    [provider]  traZODone (DESYREL) 50 MG tablet TAKE 2 TABLETS(100 MG) BY MOUTH AT BEDTIME 04/17/17   Mikey Kirschner, MD  triamcinolone cream (KENALOG) 0.1 % Apply 1 application topically 2 (two) times daily. Patient taking differently: Apply 1 application topically daily as needed (for irritation).  06/30/14   Mikey Kirschner, MD    Family History Family History  Problem Relation Age of Onset  . Hypertension Mother   . Breast cancer Mother 70  . Heart attack Father   . Diabetes Brother   . Skin cancer  Sister 67  . Brain cancer Maternal Uncle   . Cancer Maternal Uncle        NOS  . Breast cancer Cousin        maternal cousin dx <50  . Colon cancer Neg Hx     Social History Social History  Substance Use Topics  . Smoking status: Current Every Day Smoker    Packs/day: 1.00    Years: 32.00    Types: Cigarettes    Start date: 07/24/1970  . Smokeless tobacco: Never Used  . Alcohol use No     Allergies   Patient has no known allergies.   Review of Systems Review of Systems  Respiratory: Positive for cough and shortness of breath.   Cardiovascular: Positive for leg swelling (ankle edema).  All other systems reviewed and are negative.    Physical Exam Updated Vital Signs BP (!) 142/79 (BP Location: Left Arm)   Pulse 88   Temp 98.1 F (36.7 C) (Oral)   Resp 16   SpO2 96%   Physical Exam  Constitutional: He is oriented to person, place, and time. He appears well-developed and well-nourished.  HENT:  Head: Normocephalic and atraumatic.  Mouth/Throat: Oropharynx is clear and moist.  Eyes: Pupils are equal, round, and reactive to light. Conjunctivae and EOM are normal.  Neck: Normal range of motion.  No JVD  Cardiovascular: Normal rate, regular rhythm and normal heart sounds.   Pulmonary/Chest: Effort normal. He has rales.  Fine rales at bases  Abdominal: Soft. Bowel sounds are normal.  Musculoskeletal: Normal range of motion.  1+ pitting edema at the ankles  Neurological: He is alert and oriented to person, place, and time.  Skin: Skin is warm and dry.  Psychiatric: He has a normal mood and affect.  Nursing note and vitals reviewed.    ED Treatments / Results  Labs (all labs ordered are listed, but only abnormal results are displayed) Labs Reviewed  BASIC METABOLIC PANEL - Abnormal; Notable for the following:       Result Value   Glucose, Bld 112 (*)    Calcium 8.7 (*)    All other components within normal limits  CBC - Abnormal; Notable for the  following:    Hemoglobin 10.2 (*)    HCT 33.7 (*)    MCV 73.6 (*)    MCH 22.3 (*)    RDW 17.2 (*)    All other components within normal limits  BRAIN NATRIURETIC PEPTIDE  I-STAT TROPONIN, ED    EKG  EKG Interpretation  Date/Time:  Monday June 12 2017 15:03:52 EDT Ventricular Rate:  88 PR Interval:    QRS Duration: 90 QT Interval:  372 QTC Calculation: 450 R Axis:   40 Text Interpretation:  Atrial fibrillation Abnormal ECG Confirmed by Davonna Belling [phone]) on 06/12/2017 8:53:43 PM       Radiology Dg Chest 2 View  Result Date: 06/12/2017 CLINICAL DATA:  Shortness of breath. EXAM: CHEST  2 VIEW COMPARISON:  Radiographs of June 05, 2017. FINDINGS: Stable cardiomediastinal silhouette. No pneumothorax or pleural effusion is noted. Minimal bibasilar subsegmental atelectasis is noted. The visualized skeletal structures are unremarkable. IMPRESSION: Minimal bibasilar subsegmental atelectasis. Electronically Signed   By: Marijo Conception, M.D.   On: 06/12/2017 15:41    Procedures Procedures (including critical care time)  Medications Ordered in ED Medications - No data to display   Initial Impression / Assessment and Plan / ED Course  I have reviewed the triage vital signs and the nursing notes.  Pertinent labs & imaging results that were available during my care of the patient were reviewed by me and considered in my medical decision making (see chart for details).  61 year old male here with exertional shortness of breath. Has been worsening over the past 2 days. He is afebrile and nontoxic. On exam, he clinically is starting to show signs of fluid overload with some fine rales at the bases and pitting edema at the ankles. His vitals are stable on room air while sitting. Screening labs obtained from triage and are overall reassuring. Given his symptoms and exam findings, will add BNP and ambulate with pulse ox with concern of possible CHF development. Clinical suspicion  for PE is less likely given no chest pain, tachycardia, and is anticoagulated with Eliquis.  Have spoken with cardiology, Dr. Emilio Aspen-- appreciate his input.  We have discussed lab/exam findings today.  Seems weight has been trending up the past few months.  SOB may be multifactorial.  He will come evaluate in the ED and provide recommendations.  Cardiology has evaluated patient-- agrees he is volume overloaded. Patient does want to be admitted if possible.  Was given options of IP vs OP management and risks/benefits of each, patient really wants to go home.  Will plan for 40mg  IV lasix, 40mg  Kdur.  Plan to d/c home with 20mg  lasix daily with 20 MEq Kdur daily.  Has scheduled follow-up with cardiology within the next week.  11:25 PM Patient has received his lasix, urinated a few times.  Feeling better.  VS remain stable on RA.  Will d/c home with plan her cardiology.  He and wife are comfortable with this.  He understands to return here for any new/worsneing symptoms.  Patient discharged home in stable condition.  Final Clinical Impressions(s) / ED Diagnoses   Final diagnoses:  Congestive heart failure, unspecified HF chronicity, unspecified heart failure type ([place])  DOE (dyspnea on exertion)    New Prescriptions Discharge Medication List as of 06/12/2017 11:27 PM    START taking these medications   Details  furosemide (LASIX) 20 MG tablet Take 1 tablet (20 mg total) by mouth daily., Starting Mon 06/12/2017, Print    potassium chloride SA (K-DUR,KLOR-CON) 20 MEQ tablet Take 1 tablet (20 mEq total) by mouth daily., Starting Mon 06/12/2017, Print         Larene Pickett, PA-C 06/13/17 Marylou Mccoy, MD 06/13/17 [phone]

## 2017-06-12 NOTE — Discharge Instructions (Signed)
Take the prescribed medication as directed.  Still go for your echo tomorrow. Follow-up with cardiology as scheduled.   Return to the ED for new or worsening symptoms.

## 2017-06-13 ENCOUNTER — Other Ambulatory Visit: Payer: Self-pay

## 2017-06-13 ENCOUNTER — Ambulatory Visit (HOSPITAL_COMMUNITY): Payer: 59 | Attending: Cardiovascular Disease

## 2017-06-13 DIAGNOSIS — I2510 Atherosclerotic heart disease of native coronary artery without angina pectoris: Secondary | ICD-10-CM | POA: Insufficient documentation

## 2017-06-13 DIAGNOSIS — E785 Hyperlipidemia, unspecified: Secondary | ICD-10-CM | POA: Insufficient documentation

## 2017-06-13 DIAGNOSIS — J449 Chronic obstructive pulmonary disease, unspecified: Secondary | ICD-10-CM | POA: Insufficient documentation

## 2017-06-13 DIAGNOSIS — E119 Type 2 diabetes mellitus without complications: Secondary | ICD-10-CM | POA: Insufficient documentation

## 2017-06-13 DIAGNOSIS — G4733 Obstructive sleep apnea (adult) (pediatric): Secondary | ICD-10-CM | POA: Insufficient documentation

## 2017-06-13 DIAGNOSIS — R079 Chest pain, unspecified: Secondary | ICD-10-CM

## 2017-06-13 DIAGNOSIS — I119 Hypertensive heart disease without heart failure: Secondary | ICD-10-CM | POA: Diagnosis not present

## 2017-06-13 DIAGNOSIS — Z720 Tobacco use: Secondary | ICD-10-CM | POA: Diagnosis not present

## 2017-06-13 DIAGNOSIS — R0600 Dyspnea, unspecified: Secondary | ICD-10-CM | POA: Diagnosis not present

## 2017-06-13 DIAGNOSIS — I4891 Unspecified atrial fibrillation: Secondary | ICD-10-CM | POA: Insufficient documentation

## 2017-06-16 ENCOUNTER — Telehealth: Payer: Self-pay | Admitting: Cardiology

## 2017-06-16 NOTE — Telephone Encounter (Signed)
Closed Encounter  °

## 2017-07-07 ENCOUNTER — Other Ambulatory Visit: Payer: Self-pay | Admitting: Family Medicine

## 2017-07-07 NOTE — Telephone Encounter (Signed)
Last seen 05/11/17  

## 2017-07-08 NOTE — Telephone Encounter (Signed)
This plus 4 refills

## 2017-08-03 ENCOUNTER — Other Ambulatory Visit: Payer: Self-pay | Admitting: Family Medicine

## 2017-08-10 ENCOUNTER — Ambulatory Visit: Payer: 59 | Admitting: Family Medicine

## 2017-08-10 ENCOUNTER — Ambulatory Visit: Payer: 59 | Admitting: Cardiology

## 2017-08-11 ENCOUNTER — Encounter: Payer: Self-pay | Admitting: Family Medicine

## 2017-08-11 ENCOUNTER — Ambulatory Visit: Payer: 59 | Admitting: Cardiology

## 2017-08-11 ENCOUNTER — Other Ambulatory Visit: Payer: Self-pay

## 2017-08-11 ENCOUNTER — Ambulatory Visit: Payer: 59 | Admitting: Family Medicine

## 2017-08-11 VITALS — Temp 98.3°F | Ht 68.0 in | Wt 240.2 lb

## 2017-08-11 DIAGNOSIS — J329 Chronic sinusitis, unspecified: Secondary | ICD-10-CM

## 2017-08-11 MED ORDER — LISINOPRIL 5 MG PO TABS: 5.0000 mg | ORAL_TABLET | Freq: Every day | ORAL | 0 refills | Status: DC

## 2017-08-11 MED ORDER — CEFDINIR 300 MG PO CAPS: 300.0000 mg | ORAL_CAPSULE | Freq: Two times a day (BID) | ORAL | 0 refills | Status: DC

## 2017-08-11 NOTE — Patient Instructions (Signed)
Robitussin DM diabetic

## 2017-08-11 NOTE — Progress Notes (Signed)
   Subjective:    Patient ID: Shaun [name], male    DOB: May 03, 1956, 61 y.o.   MRN: [id number]  Cough  This is a new problem. The current episode started in the past 7 days. Associated symptoms include a fever, headaches, nasal congestion, a sore throat and wheezing. Treatments tried: otc meds.    Nasal disch and cough   Frontal headache  Cough prod and gunky   Dim energy/low gr fever possible  Felt warm noted some hi temp  Review of Systems  Constitutional: Positive for fever.  HENT: Positive for sore throat.   Respiratory: Positive for cough and wheezing.   Neurological: Positive for headaches.       Objective:   Physical Exam  Alert, mild malaise. Hydration good Vitals stable. frontal/ maxillary tenderness evident positive nasal congestion. pharynx normal neck supple  lungs clear/no crackles or wheezes. heart regular in rhythm       Assessment & Plan:  Impression rhinosinusitis/bronchitis likely post viral, discussed with patient. plan antibiotics prescribed. Questions answered. Symptomatic care discussed. warning signs discussed. WSL

## 2017-08-17 ENCOUNTER — Encounter: Payer: Self-pay | Admitting: Family Medicine

## 2017-08-17 ENCOUNTER — Ambulatory Visit: Payer: 59 | Admitting: Cardiology

## 2017-08-17 ENCOUNTER — Encounter: Payer: Self-pay | Admitting: Cardiology

## 2017-08-17 VITALS — BP 137/92 | HR 69 | Ht 68.0 in | Wt 242.4 lb

## 2017-08-17 DIAGNOSIS — I5033 Acute on chronic diastolic (congestive) heart failure: Secondary | ICD-10-CM | POA: Diagnosis not present

## 2017-08-17 DIAGNOSIS — I2510 Atherosclerotic heart disease of native coronary artery without angina pectoris: Secondary | ICD-10-CM

## 2017-08-17 DIAGNOSIS — E119 Type 2 diabetes mellitus without complications: Secondary | ICD-10-CM | POA: Diagnosis not present

## 2017-08-17 DIAGNOSIS — I482 Chronic atrial fibrillation, unspecified: Secondary | ICD-10-CM

## 2017-08-17 DIAGNOSIS — E785 Hyperlipidemia, unspecified: Secondary | ICD-10-CM

## 2017-08-17 DIAGNOSIS — Z7901 Long term (current) use of anticoagulants: Secondary | ICD-10-CM | POA: Diagnosis not present

## 2017-08-17 DIAGNOSIS — Z9861 Coronary angioplasty status: Secondary | ICD-10-CM

## 2017-08-17 DIAGNOSIS — I10 Essential (primary) hypertension: Secondary | ICD-10-CM | POA: Diagnosis not present

## 2017-08-17 DIAGNOSIS — G4733 Obstructive sleep apnea (adult) (pediatric): Secondary | ICD-10-CM

## 2017-08-17 DIAGNOSIS — Z720 Tobacco use: Secondary | ICD-10-CM

## 2017-08-17 MED ORDER — FUROSEMIDE 20 MG PO TABS: 20.0000 mg | ORAL_TABLET | Freq: Every day | ORAL | 3 refills | Status: DC

## 2017-08-17 MED ORDER — POTASSIUM CHLORIDE CRYS ER 20 MEQ PO TBCR: 20.0000 meq | EXTENDED_RELEASE_TABLET | Freq: Every day | ORAL | 3 refills | Status: DC

## 2017-08-17 NOTE — Assessment & Plan Note (Signed)
>>  ASSESSMENT AND PLAN FOR TOBACCO USE DISORDER WRITTEN ON 08/17/2017  2:17 PM BY Corine Shelter K, PA-C  He has cut back to less than 1/4 a pack a day

## 2017-08-17 NOTE — Assessment & Plan Note (Signed)
Eliquis. CHA2DS2 VASc=3

## 2017-08-17 NOTE — Patient Instructions (Addendum)
Follow low sodium diet   Start Lasix ( Furosemide) 20 mg every morning   Start Kdur 20 meq every morning   Lab work ( bmet ) in 4 weeks   Your physician recommends that you schedule a follow-up appointment with [facility]   Monday 11/20/17 9:20 am

## 2017-08-17 NOTE — Progress Notes (Signed)
08/17/2017 Shaun [name]   1955/12/19  [id number]  Primary Physician Mikey Kirschner, MD Primary Cardiologist: Dr Stanford Breed  HPI:  61 y/o male with a history of CAD, s/p CFX PCI in Nov 2016, NIDDM, HTN, HLD, smoking, and OSA on C-pap. He saw Dr Stanford Breed in October and complained of chest pain suspicious for angina. He was admitted for elective cath 06/08/17 which revealed a patent CFX stent and 50% mLAD. His LVF was normal, 55-65%. He was then seen in the ED on 06/12/17 with SOB. It was felt he had diastolic CHF. Lasix was added with improvement in his symptoms. He is in the office today for follow up. He has nit had any Lasix in > 4 weeks and notes increasing DOE and wgt. He denies orthopnea or PND.  Current Outpatient Medications  Medication Sig Dispense Refill  . albuterol (PROVENTIL HFA;VENTOLIN HFA) 108 (90 Base) MCG/ACT inhaler Inhale 2 puffs into the lungs every 6 (six) hours as needed for wheezing or shortness of breath.    Marland Kitchen apixaban (ELIQUIS) 5 MG TABS tablet Take 1 tablet (5 mg total) by mouth 2 (two) times daily. 60 tablet 6  . atorvastatin (LIPITOR) 80 MG tablet TAKE 1 TABLET BY MOUTH EVERY DAY (Patient taking differently: TAKE 1 TABLET BY MOUTH EVERY EVENING) 90 tablet 1  . cefdinir (OMNICEF) 300 MG capsule Take 1 capsule (300 mg total) by mouth 2 (two) times daily. 20 capsule 0  . escitalopram (LEXAPRO) 20 MG tablet TAKE 1 TABLET(20 MG) BY MOUTH DAILY 30 tablet 4  . furosemide (LASIX) 20 MG tablet Take 1 tablet (20 mg total) by mouth daily. 14 tablet 0  . HYDROcodone-acetaminophen (NORCO/VICODIN) 5-325 MG tablet Take 0.5 tablets by mouth 4 (four) times daily as needed for moderate pain or severe pain. 45 tablet 0  . lisinopril (PRINIVIL,ZESTRIL) 5 MG tablet Take 1 tablet (5 mg total) by mouth daily. 90 tablet 0  . meclizine (ANTIVERT) 25 MG tablet Take 1 tablet (25 mg total) by mouth 3 (three) times daily as needed for dizziness. 30 tablet 0  . metFORMIN (GLUCOPHAGE)  500 MG tablet Take 1 tablet (500 mg total) by mouth 2 (two) times daily with a meal. 360 tablet 0  . metFORMIN (GLUCOPHAGE) 500 MG tablet TAKE 2 TABLETS BY MOUTH TWICE DAILY WITH A MEAL 120 tablet 0  . metoprolol tartrate (LOPRESSOR) 50 MG tablet TAKE 1 AND 1/2 TABLETS BY MOUTH TWICE DAILY 270 tablet 1  . nitroGLYCERIN (NITROSTAT) 0.4 MG SL tablet Place 1 tablet (0.4 mg total) under the tongue every 5 (five) minutes as needed. (Patient taking differently: Place 0.4 mg under the tongue every 5 (five) minutes as needed for chest pain. ) 25 tablet 3  . Omega-3 Fatty Acids (FISH OIL PO) Take 1 capsule by mouth daily.     Marland Kitchen omeprazole (PRILOSEC) 40 MG capsule TAKE 1 CAPSULE BY MOUTH DAILY 30 capsule 5  . potassium chloride SA (K-DUR,KLOR-CON) 20 MEQ tablet Take 1 tablet (20 mEq total) by mouth daily. 14 tablet 0  . rOPINIRole (REQUIP) 2 MG tablet TAKE 1 TABLET BY MOUTH EVERY NIGHT AT BEDTIME 30 tablet 5  . sildenafil (VIAGRA) 100 MG tablet TAKE 1 TABLET BY MOUTH EVERY DAY AS NEEDED FOR ERECTILE DYSFUNCTION (Patient taking differently: Take 50 mg by mouth as needed for erectile dysfunction. TAKE 1 TABLET BY MOUTH EVERY DAY AS NEEDED FOR ERECTILE DYSFUNCTION) 10 tablet 0  . sodium chloride (OCEAN) 0.65 % SOLN nasal spray Place  1 spray into both nostrils as needed for congestion.    . traZODone (DESYREL) 50 MG tablet TAKE 2 TABLETS(100 MG) BY MOUTH AT BEDTIME 60 tablet 5  . triamcinolone cream (KENALOG) 0.1 % Apply 1 application topically 2 (two) times daily. (Patient taking differently: Apply 1 application topically daily as needed (for irritation). ) 60 g 1  . furosemide (LASIX) 20 MG tablet Take 1 tablet (20 mg total) by mouth daily. 90 tablet 3  . potassium chloride SA (K-DUR,KLOR-CON) 20 MEQ tablet Take 1 tablet (20 mEq total) by mouth daily. 90 tablet 3   No current facility-administered medications for this visit.     No Known Allergies  Past Medical History:  Diagnosis Date  . Arteriosclerotic  cardiovascular disease (ASCVD)    Nonobstructive; cath in 2000: 30-40% mid LAD and proximal RCA;normal EF. stress nuclear in 2006-subtle inferoseptal hypoperfusion with reversibility; negative stress EKG; good exercise tolerance  . Asthma   . CHF (congestive heart failure) ([place])   . Colon polyps    30 colon polyps found on first colonoscopy  . Diabetes mellitus without complication ([place])   . Hyperlipidemia   . Hypertension   . Insomnia   . Obstructive sleep apnea 12/2009   01/26/2010 AHI 83/hr  . Paroxysmal atrial fibrillation ([place]) 10/2004   Onset in 10/2004; recurred 09/2008  . PUD (peptic ulcer disease)    1980s  . RLS (restless legs syndrome)   . Sinusitis     Social History   Socioeconomic History  . Marital status: Married    Spouse name: Not on file  . Number of children: 5  . Years of education: Not on file  . Highest education level: Not on file  Social Needs  . Financial resource strain: Not on file  . Food insecurity - worry: Not on file  . Food insecurity - inability: Not on file  . Transportation needs - medical: Not on file  . Transportation needs - non-medical: Not on file  Occupational History  . Occupation: employed    Fish farm manager: [employer]    Comment: full-time  Tobacco Use  . Smoking status: Current Every Day Smoker    Packs/day: 1.00    Years: 32.00    Pack years: 32.00    Types: Cigarettes    Start date: 07/24/1970  . Smokeless tobacco: Never Used  Substance and Sexual Activity  . Alcohol use: No    Alcohol/week: 0.0 oz  . Drug use: No  . Sexual activity: Yes    Partners: Female  Other Topics Concern  . Not on file  Social History Narrative  . Not on file     Family History  Problem Relation Age of Onset  . Hypertension Mother   . Breast cancer Mother 69  . Heart attack Father   . Diabetes Brother   . Skin cancer Sister 56  . Brain cancer Maternal Uncle   . Cancer Maternal Uncle        NOS  . Breast cancer Cousin        maternal cousin  dx <50  . Colon cancer Neg Hx      Review of Systems: General: negative for chills, fever, night sweats or weight changes.  Cardiovascular: negative for chest pain, dyspnea on exertion, edema, orthopnea, palpitations, paroxysmal nocturnal dyspnea or shortness of breath Dermatological: negative for rash Respiratory: negative for cough or wheezing Urologic: negative for hematuria Abdominal: negative for nausea, vomiting, diarrhea, bright red blood per rectum, melena, or hematemesis Neurologic:  negative for visual changes, syncope, or dizziness All other systems reviewed and are otherwise negative except as noted above.    Blood pressure (!) 137/92, pulse 69, height 5\' 8"  (1.727 m), weight 242 lb 6.4 oz (110 kg).  General appearance: alert, cooperative, no distress and moderately obese Neck: no carotid bruit and no JVD Lungs: clear to auscultation bilaterally Heart: regular rate and rhythm Extremities: no edema  EKG AF with VR-70  ASSESSMENT AND PLAN:   Acute on chronic diastolic CHF (congestive heart failure) (H[facility]) Pt sen in the ED 06/12/17 with dyspnea and felt to have diastolic CHF (BNP was 950)  CAD S/P percutaneous coronary angioplasty [place] Nov 2016- cath [facility] lesion-DES Re look cath for chest pain 06/08/17- non obstructive CAD  Chronic atrial fibrillation ([place]) onset in 10/2004; recurred 09/2008, now permanent  Chronic anticoagulation Eliquis. CHA2DS2 VASc=3  Hypertension EF 60-65% with moderate LVH, (unable to evaluate diastolic dysfunction), and severe LAE on echo Oct 2018  Obstructive sleep apnea Compliant with C-pap  Non-insulin treated type 2 diabetes mellitus ([place]) Followed by PCP  Tobacco abuse He has cut back to less than 1/4 a pack a day   PLAN  Resume Lasix 20 mg daily with K+. Check BMP in 4 weeks, OV 3 months. We discussed smoking cessation and low sodium diet.   Kerin Ransom PA-C 08/17/2017 2:18 PM

## 2017-08-17 NOTE — Assessment & Plan Note (Signed)
>>  ASSESSMENT AND PLAN FOR CHRONIC ANTICOAGULATION WRITTEN ON 08/17/2017  2:14 PM BY Diona FantiKILROY, LUKE K, PA-C  Eliquis. CHA2DS2 VASc=3

## 2017-08-17 NOTE — Assessment & Plan Note (Signed)
He has cut back to less than 1/4 a pack a day

## 2017-08-17 NOTE — Assessment & Plan Note (Signed)
Compliant with C-pap 

## 2017-08-17 NOTE — Assessment & Plan Note (Signed)
Followed by PCP

## 2017-08-17 NOTE — Assessment & Plan Note (Signed)
onset in 10/2004; recurred 09/2008, now permanent

## 2017-08-17 NOTE — Assessment & Plan Note (Signed)
[place] Nov 2016- cath [facility] lesion-DES Re look cath for chest pain 06/08/17- non obstructive CAD

## 2017-08-17 NOTE — Assessment & Plan Note (Signed)
EF 60-65% with moderate LVH, (unable to evaluate diastolic dysfunction), and severe LAE on echo Oct 2018

## 2017-08-17 NOTE — Assessment & Plan Note (Signed)
Pt sen in the ED 06/12/17 with dyspnea and felt to have diastolic CHF (BNP was 404)

## 2017-08-23 ENCOUNTER — Encounter: Payer: Self-pay | Admitting: Family Medicine

## 2017-08-23 ENCOUNTER — Ambulatory Visit: Payer: 59 | Admitting: Family Medicine

## 2017-08-23 VITALS — BP 128/84 | Ht 68.0 in | Wt 244.0 lb

## 2017-08-23 DIAGNOSIS — I2510 Atherosclerotic heart disease of native coronary artery without angina pectoris: Secondary | ICD-10-CM | POA: Insufficient documentation

## 2017-08-23 DIAGNOSIS — G4700 Insomnia, unspecified: Secondary | ICD-10-CM | POA: Insufficient documentation

## 2017-08-23 DIAGNOSIS — I10 Essential (primary) hypertension: Secondary | ICD-10-CM

## 2017-08-23 DIAGNOSIS — I482 Chronic atrial fibrillation, unspecified: Secondary | ICD-10-CM

## 2017-08-23 DIAGNOSIS — Z79899 Other long term (current) drug therapy: Secondary | ICD-10-CM | POA: Diagnosis not present

## 2017-08-23 DIAGNOSIS — J45909 Unspecified asthma, uncomplicated: Secondary | ICD-10-CM | POA: Insufficient documentation

## 2017-08-23 DIAGNOSIS — I5032 Chronic diastolic (congestive) heart failure: Secondary | ICD-10-CM | POA: Insufficient documentation

## 2017-08-23 DIAGNOSIS — E785 Hyperlipidemia, unspecified: Secondary | ICD-10-CM

## 2017-08-23 DIAGNOSIS — K279 Peptic ulcer, site unspecified, unspecified as acute or chronic, without hemorrhage or perforation: Secondary | ICD-10-CM | POA: Insufficient documentation

## 2017-08-23 DIAGNOSIS — E119 Type 2 diabetes mellitus without complications: Secondary | ICD-10-CM | POA: Diagnosis not present

## 2017-08-23 DIAGNOSIS — J329 Chronic sinusitis, unspecified: Secondary | ICD-10-CM | POA: Insufficient documentation

## 2017-08-23 DIAGNOSIS — G2581 Restless legs syndrome: Secondary | ICD-10-CM | POA: Insufficient documentation

## 2017-08-23 DIAGNOSIS — F321 Major depressive disorder, single episode, moderate: Secondary | ICD-10-CM

## 2017-08-23 LAB — POCT GLYCOSYLATED HEMOGLOBIN (HGB A1C): Hemoglobin A1C: 6.3

## 2017-08-23 MED ORDER — HYDROCODONE-ACETAMINOPHEN 5-325 MG PO TABS: 0.5000 | ORAL_TABLET | Freq: Four times a day (QID) | ORAL | 0 refills | Status: DC | PRN

## 2017-08-23 MED ORDER — ATORVASTATIN CALCIUM 80 MG PO TABS: 80.0000 mg | ORAL_TABLET | Freq: Every day | ORAL | 1 refills | Status: DC

## 2017-08-23 MED ORDER — HYDROCODONE-ACETAMINOPHEN 5-325 MG PO TABS
0.5000 | ORAL_TABLET | Freq: Four times a day (QID) | ORAL | 0 refills | Status: DC | PRN
Start: 2017-08-23 — End: 2017-08-23

## 2017-08-23 MED ORDER — TRAZODONE HCL 50 MG PO TABS: ORAL_TABLET | ORAL | 5 refills | Status: DC

## 2017-08-23 MED ORDER — ESCITALOPRAM OXALATE 20 MG PO TABS: ORAL_TABLET | ORAL | 5 refills | Status: DC

## 2017-08-23 MED ORDER — OMEPRAZOLE 40 MG PO CPDR: 40.0000 mg | DELAYED_RELEASE_CAPSULE | Freq: Every day | ORAL | 5 refills | Status: DC

## 2017-08-23 MED ORDER — ROPINIROLE HCL 3 MG PO TABS: 3.0000 mg | ORAL_TABLET | Freq: Every day | ORAL | 1 refills | Status: DC

## 2017-08-23 NOTE — Progress Notes (Signed)
Subjective:    Patient ID: Shaun [name], male    DOB: December 02, 1955, 61 y.o.   MRN: [id number]  HPI This patient was seen today for chronic pain.  Takes pain meds for right hip pain.   The medication list was reviewed and updated.   -Compliance with medication: yes  - Number patient states they take daily: one- half bid   -when was the last dose patient took? This morning  The patient was advised the importance of maintaining medication and not using illegal substances with these.  Refills needed: yes  The patient was educated that we can provide 3 monthly scripts for their medication, it is their responsibility to follow the instructions.  Side effects or complications from medications: none  Patient is aware that pain medications are meant to minimize the severity of the pain to allow their pain levels to improve to allow for better function. They are aware of that pain medications cannot totally remove their pain.  Due for UDT ( at least once per year) : last one sept 2018  Pt states no concerns today.   Results for orders placed or performed in visit on 08/23/17  POCT glycosylated hemoglobin (Hb A1C)  Result Value Ref Range   Hemoglobin A1C 6.3    Patient claims compliance with diabetes medication. No obvious side effects. Reports no substantial low sugar spells. Most numbers are generally in good range when checked fasting. Generally does not miss a dose of medication. Watching diabetic diet closely  Blood pressure medicine and blood pressure levels reviewed today with patient. Compliant with blood pressure medicine. States does not miss a dose. No obvious side effects. Blood pressure generally good when checked elsewhere. Watching salt intake.   Patient continues to take lipid medication regularly. No obvious side effects from it. Generally does not miss a dose. Prior blood work results are reviewed with patient. Patient continues to work on fat intake in  diet    Patient compliant with insomnia medication. Generally takes most nights. No obvious morning drowsiness. Definitely helps patient sleep. Without it patient states would not get a good nights rest.   Patient compliant with pain medication. Continues to experience the pain which led to initiation of analgesic intervention. No significant negative side effects. States definitely needs the pain medication to maintain current level of functioning. Does not receive controlled substance pain medication elsewhere.    Review of Systems No headache, no major weight loss or weight gain, no chest pain no back pain abdominal pain no change in bowel habits complete ROS otherwise negative     Objective:   Physical Exam  Alert and oriented, vitals reviewed and stable, NAD ENT-TM's and ext canals WNL bilat via otoscopic exam Soft palate, tonsils and post pharynx WNL via oropharyngeal exam Neck-symmetric, no masses; thyroid nonpalpable and nontender Pulmonary-no tachypnea or accessory muscle use; Clear without wheezes via auscultation Card--no abnrml murmurs, rhythm reg and rate WNL Carotid pulses symmetric, without bruits       Assessment & Plan:  I impression #1 type 2 diabetes good control.  Compliance discussed to maintain same meds  2.  Hyperlipidemia.  Current control uncertain.  Patient did not get blood work discussed prior blood work reviewed to maintain same  3.  Hypertension good control discussed to maintain same meds  4.  Chronic pain.  With ongoing need for medications pain medicines refilled  Proper blood work.  Medications refilled.  Impression: Chronic pain. Patient compliant with medication. No substantial  side effects. [place] controlled substance registry reviewed to ensure compliance and proper use of medication. Patient aware goal of medicine is not complete resolution of pain but to control his symptoms to improve his functional capacity. Aware of potential  adverse side effects   Recheck in several months for chronic visit plus wellness

## 2017-09-01 ENCOUNTER — Other Ambulatory Visit: Payer: Self-pay | Admitting: Family Medicine

## 2017-09-04 ENCOUNTER — Other Ambulatory Visit: Payer: Self-pay

## 2017-09-04 MED ORDER — APIXABAN 5 MG PO TABS: 5.0000 mg | ORAL_TABLET | Freq: Two times a day (BID) | ORAL | 6 refills | Status: DC

## 2017-09-06 NOTE — Progress Notes (Signed)
HPI: FU permanent atrial fibrillation, coronary artery disease, diabetes mellitus and hypertension. Patient is status post PCI of his circumflex in November 2016. Patient was seen in atrial fibrillation clinic in June 2018 to see if there are options at restoring sinus rhythm. Given long-standing atrial fibrillation and left atrial enlargement it was felt that rate control and anticoagulation indicated.  Cardiac catheterization October 2018 showed a 30% distal circumflex.  LV function was normal with normal LV filling pressure.  Echocardiogram October 2018 showed normal LV function.  There was severe left atrial enlargement.  Patient seen in October with increased dyspnea and felt to have diastolic congestive heart failure.  Lasix initiated.  Since last seen,  the patient has dyspnea with more extreme activities but not with routine activities. It is relieved with rest. It is not associated with chest pain. There is no orthopnea, PND or pedal edema. There is no syncope or palpitations. There is no exertional chest pain.   Current Outpatient Medications  Medication Sig Dispense Refill  . albuterol (PROVENTIL HFA;VENTOLIN HFA) 108 (90 Base) MCG/ACT inhaler Inhale 2 puffs into the lungs every 6 (six) hours as needed for wheezing or shortness of breath.    Marland Kitchen apixaban (ELIQUIS) 5 MG TABS tablet Take 1 tablet (5 mg total) by mouth 2 (two) times daily. 60 tablet 6  . atorvastatin (LIPITOR) 80 MG tablet Take 1 tablet (80 mg total) by mouth daily. 90 tablet 1  . cefdinir (OMNICEF) 300 MG capsule Take 1 capsule (300 mg total) by mouth 2 (two) times daily. 20 capsule 0  . escitalopram (LEXAPRO) 20 MG tablet TAKE 1 TABLET(20 MG) BY MOUTH DAILY 30 tablet 5  . furosemide (LASIX) 20 MG tablet Take 1 tablet (20 mg total) by mouth daily. 90 tablet 3  . HYDROcodone-acetaminophen (NORCO/VICODIN) 5-325 MG tablet Take 0.5 tablets by mouth 4 (four) times daily as needed for moderate pain or severe pain. 45 tablet 0    . lisinopril (PRINIVIL,ZESTRIL) 5 MG tablet Take 1 tablet (5 mg total) by mouth daily. 90 tablet 0  . meclizine (ANTIVERT) 25 MG tablet Take 1 tablet (25 mg total) by mouth 3 (three) times daily as needed for dizziness. 30 tablet 0  . metFORMIN (GLUCOPHAGE) 500 MG tablet Take 1 tablet (500 mg total) by mouth 2 (two) times daily with a meal. 360 tablet 0  . metoprolol tartrate (LOPRESSOR) 50 MG tablet TAKE 1 AND 1/2 TABLETS BY MOUTH TWICE DAILY 270 tablet 1  . nitroGLYCERIN (NITROSTAT) 0.4 MG SL tablet Place 1 tablet (0.4 mg total) under the tongue every 5 (five) minutes as needed. (Patient taking differently: Place 0.4 mg under the tongue every 5 (five) minutes as needed for chest pain. ) 25 tablet 3  . Omega-3 Fatty Acids (FISH OIL PO) Take 1 capsule by mouth daily.     Marland Kitchen omeprazole (PRILOSEC) 40 MG capsule Take 1 capsule (40 mg total) by mouth daily. 30 capsule 5  . potassium chloride SA (K-DUR,KLOR-CON) 20 MEQ tablet Take 1 tablet (20 mEq total) by mouth daily. 14 tablet 0  . rOPINIRole (REQUIP) 3 MG tablet Take 1 tablet (3 mg total) by mouth at bedtime. 90 tablet 1  . sildenafil (VIAGRA) 100 MG tablet TAKE 1 TABLET BY MOUTH EVERY DAY AS NEEDED FOR ERECTILE DYSFUNCTION (Patient taking differently: Take 50 mg by mouth as needed for erectile dysfunction. TAKE 1 TABLET BY MOUTH EVERY DAY AS NEEDED FOR ERECTILE DYSFUNCTION) 10 tablet 0  . sodium  chloride (OCEAN) 0.65 % SOLN nasal spray Place 1 spray into both nostrils as needed for congestion.    . traZODone (DESYREL) 50 MG tablet TAKE 2 TABLETS(100 MG) BY MOUTH AT BEDTIME 60 tablet 5  . triamcinolone cream (KENALOG) 0.1 % Apply 1 application topically 2 (two) times daily. (Patient taking differently: Apply 1 application topically daily as needed (for irritation). ) 60 g 1   No current facility-administered medications for this visit.      Past Medical History:  Diagnosis Date  . Arteriosclerotic cardiovascular disease (ASCVD)     Nonobstructive; cath in 2000: 30-40% mid LAD and proximal RCA;normal EF. stress nuclear in 2006-subtle inferoseptal hypoperfusion with reversibility; negative stress EKG; good exercise tolerance  . Asthma   . CHF (congestive heart failure) ([place])   . Colon polyps    30 colon polyps found on first colonoscopy  . Diabetes mellitus without complication ([place])   . Hyperlipidemia   . Hypertension   . Insomnia   . Obstructive sleep apnea 12/2009   01/26/2010 AHI 83/hr  . Paroxysmal atrial fibrillation ([place]) 10/2004   Onset in 10/2004; recurred 09/2008  . PUD (peptic ulcer disease)    1980s  . RLS (restless legs syndrome)   . Sinusitis     Past Surgical History:  Procedure Laterality Date  . CARDIAC CATHETERIZATION N/A 07/21/2015   Procedure: Left Heart Cath and Coronary Angiography;  Surgeon: Peter M Martinique, MD;  Location: [facility];  Service: Cardiovascular;  Laterality: N/A;  . CARDIAC CATHETERIZATION N/A 07/21/2015   Procedure: Coronary Stent Intervention;  Surgeon: Peter M Martinique, MD;  Location: [facility];  Service: Cardiovascular;  Laterality: N/A;  . COLONOSCOPY N/A 05/19/2014   Dr. Barnie Alderman diverticulosis/moderate external hemorrhoids, >20 simple adenomas. Genetic screening negative.   Marland Kitchen HERNIA REPAIR  1986   Left inguinal  . INTRAVASCULAR PRESSURE WIRE/FFR STUDY Left 06/08/2017   Procedure: INTRAVASCULAR PRESSURE WIRE/FFR STUDY;  Surgeon: Nelva Bush, MD;  Location: [facility];  Service: Cardiovascular;  Laterality: Left;  LAD and CFX  . LEFT HEART CATH AND CORONARY ANGIOGRAPHY N/A 06/08/2017   Procedure: LEFT HEART CATH AND CORONARY ANGIOGRAPHY;  Surgeon: Nelva Bush, MD;  Location: [facility];  Service: Cardiovascular;  Laterality: N/A;  . ROTATOR CUFF REPAIR     Right    Social History   Socioeconomic History  . Marital status: Married    Spouse name: Not on file  . Number of children: 5  . Years of education: Not on file  .  Highest education level: Not on file  Social Needs  . Financial resource strain: Not on file  . Food insecurity - worry: Not on file  . Food insecurity - inability: Not on file  . Transportation needs - medical: Not on file  . Transportation needs - non-medical: Not on file  Occupational History  . Occupation: employed    Fish farm manager: [employer]    Comment: full-time  Tobacco Use  . Smoking status: Current Every Day Smoker    Packs/day: 1.00    Years: 32.00    Pack years: 32.00    Types: Cigarettes    Start date: 07/24/1970  . Smokeless tobacco: Never Used  Substance and Sexual Activity  . Alcohol use: No    Alcohol/week: 0.0 oz  . Drug use: No  . Sexual activity: Yes    Partners: Female  Other Topics Concern  . Not on file  Social History Narrative  . Not on  file    Family History  Problem Relation Age of Onset  . Hypertension Mother   . Breast cancer Mother 79  . Heart attack Father   . Diabetes Brother   . Skin cancer Sister 25  . Brain cancer Maternal Uncle   . Cancer Maternal Uncle        NOS  . Breast cancer Cousin        maternal cousin dx <50  . Colon cancer Neg Hx     ROS: no fevers or chills, productive cough, hemoptysis, dysphasia, odynophagia, melena, hematochezia, dysuria, hematuria, rash, seizure activity, orthopnea, PND, claudication. Remaining systems are negative.  Physical Exam: Well-developed well-nourished in no acute distress.  Skin is warm and dry.  HEENT is normal.  Neck is supple.  Chest is clear to auscultation with normal expansion.  Cardiovascular exam is irregular Abdominal exam nontender or distended. No masses palpated. Extremities show trace edema. neuro grossly intact   A/P  1 permanent atrial fibrillation-plan to continue beta blocker for rate control. Continue apixaban. Check hemoglobin and renal function.  2 coronary artery disease-last catheterization as outlined. Plan medical therapy. Continue statin. No aspirin given  need for anticoagulation.  3 hyperlipidemia-continue statin. Check lipids and liver.  4 hypertension-pressure is controlled. Continue present medications.  5 chronic diastolic congestive heart failure-patient is euvolemic on examination. Continue present dose of diuretic. Check potassium and renal function.  6 obstructive sleep apnea-patient is using CPAP.  7 tobacco abuse-patient counseled on discontinuing.  Kirk Ruths, MD

## 2017-09-13 ENCOUNTER — Ambulatory Visit: Payer: 59 | Admitting: Cardiology

## 2017-09-13 ENCOUNTER — Encounter: Payer: Self-pay | Admitting: Cardiology

## 2017-09-13 VITALS — BP 125/76 | HR 82 | Ht 68.0 in | Wt 244.0 lb

## 2017-09-13 DIAGNOSIS — I2510 Atherosclerotic heart disease of native coronary artery without angina pectoris: Secondary | ICD-10-CM

## 2017-09-13 DIAGNOSIS — Z720 Tobacco use: Secondary | ICD-10-CM

## 2017-09-13 DIAGNOSIS — I5032 Chronic diastolic (congestive) heart failure: Secondary | ICD-10-CM

## 2017-09-13 DIAGNOSIS — I482 Chronic atrial fibrillation: Secondary | ICD-10-CM | POA: Diagnosis not present

## 2017-09-13 DIAGNOSIS — I4821 Permanent atrial fibrillation: Secondary | ICD-10-CM

## 2017-09-13 NOTE — Patient Instructions (Signed)
Medication Instructions:   NO CHANGE  Labwork:  Your physician recommends that you HAVE LAB WORK WITH DR Wolfgang Phoenix  Follow-Up:  Your physician wants you to follow-up in: [place] will receive a reminder letter in the mail two months in advance. If you don't receive a letter, please call our office to schedule the follow-up appointment.   If you need a refill on your cardiac medications before your next appointment, please call your pharmacy.

## 2017-09-15 ENCOUNTER — Encounter: Payer: Self-pay | Admitting: Family Medicine

## 2017-09-15 ENCOUNTER — Ambulatory Visit: Payer: 59 | Admitting: Family Medicine

## 2017-09-15 VITALS — BP 132/90 | Temp 98.4°F | Ht 68.0 in | Wt 242.0 lb

## 2017-09-15 DIAGNOSIS — I482 Chronic atrial fibrillation, unspecified: Secondary | ICD-10-CM

## 2017-09-15 DIAGNOSIS — J441 Chronic obstructive pulmonary disease with (acute) exacerbation: Secondary | ICD-10-CM

## 2017-09-15 DIAGNOSIS — J019 Acute sinusitis, unspecified: Secondary | ICD-10-CM

## 2017-09-15 MED ORDER — AMOXICILLIN-POT CLAVULANATE 875-125 MG PO TABS
1.0000 | ORAL_TABLET | Freq: Two times a day (BID) | ORAL | 0 refills | Status: DC
Start: 2017-09-15 — End: 2017-10-20

## 2017-09-15 MED ORDER — PREDNISONE 20 MG PO TABS: ORAL_TABLET | ORAL | 0 refills | Status: DC

## 2017-09-15 MED ORDER — BENZONATATE 100 MG PO CAPS: 100.0000 mg | ORAL_CAPSULE | Freq: Three times a day (TID) | ORAL | 0 refills | Status: DC | PRN

## 2017-09-15 NOTE — Progress Notes (Addendum)
   Subjective:    Patient ID: Shaun [name], male    DOB: Jul 05, 1956, 62 y.o.   MRN: [id number]  Sinusitis  This is a new problem. Episode onset: 4 days. Associated symptoms include congestion, coughing, headaches and a sore throat. Pertinent negatives include no ear pain. (Fever, wheezing) Treatments tried: otc meds.  Symptoms over the past for 5 days head congestion sore throat drainage coughing sinus pressure pain discomfort denies high fever chills but does relate low-grade fevers relates some fatigue he has a history of COPD and heart disease    Review of Systems  Constitutional: Positive for fatigue and fever. Negative for activity change.  HENT: Positive for congestion, rhinorrhea and sore throat. Negative for ear pain.   Eyes: Negative for discharge.  Respiratory: Positive for cough. Negative for wheezing.   Cardiovascular: Negative for chest pain.  Neurological: Positive for headaches.       Objective:   Physical Exam  Constitutional: He appears well-developed.  HENT:  Head: Normocephalic.  Mouth/Throat: Oropharynx is clear and moist. No oropharyngeal exudate.  Neck: Normal range of motion.  Cardiovascular: Normal rate and normal heart sounds.  No murmur heard. Pulmonary/Chest: Effort normal and breath sounds normal. He has no wheezes.  Lymphadenopathy:    He has no cervical adenopathy.  Neurological: He exhibits normal muscle tone.  Skin: Skin is warm and dry.  Nursing note and vitals reviewed.   Patient has history of atrial fibrillation he is still in atrial fib he is on his blood thinner no bleeding issues heart rate controlled Patient does not appear toxic     Assessment & Plan:  Viral syndrome Secondary sinus.salsin Patient was seen today for upper respiratory illness. It is felt that the patient is dealing with sinusitis. Antibiotics were prescribed today. Importance of compliance with medication was discussed. Symptoms should gradually resolve over  the course of the next several days. If high fevers, progressive illness, difficulty breathing, worsening condition or failure for symptoms to improve over the next several days then the patient is to follow-up. If any emergent conditions the patient is to follow-up in the emergency department otherwise to follow-up in the office. Patient was warned regarding progression of illness x-rays lab work not indicated Continue albuterol prednisone taper because of COPD history and frequent use of albuterol  Atrial fib continue his blood thinner no tachycardia with all these other medicines currently warnings were discussed

## 2017-09-18 ENCOUNTER — Encounter: Payer: Self-pay | Admitting: Family Medicine

## 2017-10-02 ENCOUNTER — Telehealth: Payer: Self-pay | Admitting: Family Medicine

## 2017-10-02 NOTE — Telephone Encounter (Signed)
Rx prior auth APPROVED for pt's HYDROcodone-acetaminophen (NORCO/VICODIN) 5-325 MG tablet Take 0.5 tablets by mouth 4 (four) times daily as needed for moderate pain or severe pain #45/30 days  Auth# [id number], valid 09/29/17-03/29/18 through [employer]  Faxed approval to [place], sent to be scanned & filed

## 2017-10-05 ENCOUNTER — Other Ambulatory Visit: Payer: Self-pay | Admitting: Cardiology

## 2017-10-05 NOTE — Telephone Encounter (Signed)
REFILL 

## 2017-10-12 ENCOUNTER — Other Ambulatory Visit: Payer: Self-pay | Admitting: Family Medicine

## 2017-10-20 ENCOUNTER — Encounter: Payer: Self-pay | Admitting: Family Medicine

## 2017-10-20 ENCOUNTER — Ambulatory Visit: Payer: 59 | Admitting: Family Medicine

## 2017-10-20 VITALS — BP 148/90 | Ht 68.0 in | Wt 239.1 lb

## 2017-10-20 DIAGNOSIS — R300 Dysuria: Secondary | ICD-10-CM

## 2017-10-20 DIAGNOSIS — J111 Influenza due to unidentified influenza virus with other respiratory manifestations: Secondary | ICD-10-CM | POA: Diagnosis not present

## 2017-10-20 LAB — POCT URINALYSIS DIPSTICK
Blood, UA: NEGATIVE
LEUKOCYTES UA: NEGATIVE
PH UA: 6 (ref 5.0–8.0)
Spec Grav, UA: 1.02 (ref 1.010–1.025)

## 2017-10-20 MED ORDER — OSELTAMIVIR PHOSPHATE 75 MG PO CAPS: 75.0000 mg | ORAL_CAPSULE | Freq: Two times a day (BID) | ORAL | 0 refills | Status: DC

## 2017-10-20 MED ORDER — HYDROCODONE-HOMATROPINE 5-1.5 MG/5ML PO SYRP: 5.0000 mL | ORAL_SOLUTION | Freq: Four times a day (QID) | ORAL | 0 refills | Status: DC | PRN

## 2017-10-20 MED ORDER — ALBUTEROL SULFATE HFA 108 (90 BASE) MCG/ACT IN AERS: 2.0000 | INHALATION_SPRAY | Freq: Four times a day (QID) | RESPIRATORY_TRACT | 0 refills | Status: DC | PRN

## 2017-10-20 NOTE — Progress Notes (Signed)
   Subjective:    Patient ID: Shaun [name], male    DOB: 02-16-1956, 62 y.o.   MRN: [id number]  HPI  Patient is here today with complaints of cough,wheezing,diarrhea,runny nose,headache,painful urination,sore throat,fever for two days. He has been taking otc medications for this.  Drainage got bad  Then developed into sig cough   Used the iinhaker   dysuriea with urintation   Headache severe achie thruou out      Wheezing with the cough  Review of Systems     Results for orders placed or performed in visit on 10/20/17  POCT urinalysis dipstick  Result Value Ref Range   Color, UA     Clarity, UA     Glucose, UA     Bilirubin, UA     Ketones, UA     Spec Grav, UA 1.020 1.010 - 1.025   Blood, UA Negative    pH, UA 6.0 5.0 - 8.0   Protein, UA     Urobilinogen, UA  0.2 or 1.0 E.U./dL   Nitrite, UA     Leukocytes, UA Negative Negative   Appearance     Odor      Objective:   Physical Exam  Alert vitals reviewed, moderate malaise. Hydration good. Positive nasal congestion lungs no crackles or wheezes, no tachypnea, intermittent bronchial cough during exam heart regular rate and rhythm.       Assessment & Plan:  Impression influenza discussed at length. Petra Kuba of illness and potential sequela discussed. Plan Tamiflu prescribed if indicated and timing appropriate. Symptom care discussed. Warning signs discussed. WSL

## 2017-11-03 ENCOUNTER — Other Ambulatory Visit: Payer: Self-pay | Admitting: Family Medicine

## 2017-11-03 NOTE — Telephone Encounter (Signed)
I called and left a message asked that she r/c. 

## 2017-11-09 ENCOUNTER — Other Ambulatory Visit: Payer: Self-pay | Admitting: Cardiology

## 2017-11-10 ENCOUNTER — Other Ambulatory Visit: Payer: Self-pay | Admitting: Family Medicine

## 2017-11-11 ENCOUNTER — Other Ambulatory Visit: Payer: Self-pay | Admitting: Family Medicine

## 2017-11-20 ENCOUNTER — Ambulatory Visit: Payer: 59 | Admitting: Cardiology

## 2017-11-23 ENCOUNTER — Ambulatory Visit: Payer: 59 | Admitting: Family Medicine

## 2017-11-23 ENCOUNTER — Encounter: Payer: Self-pay | Admitting: Family Medicine

## 2017-11-23 ENCOUNTER — Encounter: Payer: 59 | Admitting: Family Medicine

## 2017-11-23 VITALS — Ht 68.0 in

## 2017-11-23 DIAGNOSIS — Z79891 Long term (current) use of opiate analgesic: Secondary | ICD-10-CM

## 2017-11-23 DIAGNOSIS — E119 Type 2 diabetes mellitus without complications: Secondary | ICD-10-CM | POA: Diagnosis not present

## 2017-11-23 DIAGNOSIS — F321 Major depressive disorder, single episode, moderate: Secondary | ICD-10-CM | POA: Diagnosis not present

## 2017-11-23 DIAGNOSIS — I10 Essential (primary) hypertension: Secondary | ICD-10-CM | POA: Diagnosis not present

## 2017-11-23 DIAGNOSIS — Z0001 Encounter for general adult medical examination with abnormal findings: Secondary | ICD-10-CM

## 2017-11-23 DIAGNOSIS — Z0000 Encounter for general adult medical examination without abnormal findings: Secondary | ICD-10-CM

## 2017-11-23 DIAGNOSIS — Z125 Encounter for screening for malignant neoplasm of prostate: Secondary | ICD-10-CM

## 2017-11-23 DIAGNOSIS — E785 Hyperlipidemia, unspecified: Secondary | ICD-10-CM

## 2017-11-23 LAB — POCT GLYCOSYLATED HEMOGLOBIN (HGB A1C): Hemoglobin A1C: 6.1

## 2017-11-23 MED ORDER — ZOSTER VAC RECOMB ADJUVANTED 50 MCG/0.5ML IM SUSR: 0.5000 mL | Freq: Once | INTRAMUSCULAR | 1 refills | Status: AC

## 2017-11-23 MED ORDER — HYDROCODONE-ACETAMINOPHEN 5-325 MG PO TABS: 0.5000 | ORAL_TABLET | Freq: Four times a day (QID) | ORAL | 0 refills | Status: DC | PRN

## 2017-11-23 NOTE — Progress Notes (Signed)
Subjective:    Patient ID: Shaun [name], male    DOB: 04-06-1956, 62 y.o.   MRN: [id number]  HPI The patient comes in today for a wellness visit.    A review of their health history was completed.  A review of medications was also completed.  Any needed refills; yes  Eating habits: eating healthy at times  Falls/  MVA accidents in past few months: no  Regular exercise: yes-walking  Specialist pt sees on regular basis: Dr Stanford Breed- heart dr  Preventative health issues were discussed.   Additional concerns: been ill and anxious lately Results for orders placed or performed in visit on 11/23/17  POCT glycosylated hemoglobin (Hb A1C)  Result Value Ref Range   Hemoglobin A1C 6.1     Blood pressure medicine and blood pressure levels reviewed today with patient. Compliant with blood pressure medicine. States does not miss a dose. No obvious side effects. Blood pressure generally good when checked elsewhere. Watching salt intake.   Patient claims compliance with diabetes medication. No obvious side effects. Reports no substantial low sugar spells. Most numbers are generally in good range when checked fasting. Generally does not miss a dose of medication. Watching diabetic diet closely  Patient continues to take lipid medication regularly. No obvious side effects from it. Generally does not miss a dose. Prior blood work results are reviewed with patient. Patient continues to work on fat intake in diet   numbrs up a bit at times  Exercise has strted slking three d per week    Patient compliant with pain medication. Continues to experience the pain which led to initiation of analgesic intervention. No significant negative side effects. States definitely needs the pain medication to maintain current level of functioning. Does not receive controlled substance pain medication elsewhere.  Patient notes ongoing compliance with antidepressant medication. No obvious side effects.  Reports does not miss a dose. Overall continues to help depression substantially. No thoughts of homicide or suicide. Would like to maintain medication.   Review of Systems No headache, no major weight loss or weight gain, no chest pain no back pain abdominal pain no change in bowel habits complete ROS otherwise negative     Objective:   Physical Exam  Constitutional: He appears well-developed and well-nourished.  HENT:  Head: Normocephalic and atraumatic.  Right Ear: External ear normal.  Left Ear: External ear normal.  Nose: Nose normal.  Mouth/Throat: Oropharynx is clear and moist.  Eyes: Right eye exhibits no discharge. Left eye exhibits no discharge. No scleral icterus.  Neck: Normal range of motion. Neck supple. No thyromegaly present.  Cardiovascular: Normal rate, regular rhythm and normal heart sounds.  No murmur heard. Pulmonary/Chest: Effort normal and breath sounds normal. No respiratory distress. He has no wheezes.  Abdominal: Soft. Bowel sounds are normal. He exhibits no distension and no mass. There is no tenderness.  Genitourinary: Penis normal.  Musculoskeletal: Normal range of motion. He exhibits no edema.  Lymphadenopathy:    He has no cervical adenopathy.  Neurological: He is alert. He exhibits normal muscle tone. Coordination normal.  Skin: Skin is warm and dry. No erythema.  Psychiatric: He has a normal mood and affect. His behavior is normal. Judgment normal.  Vitals reviewed.         Assessment & Plan:  Last colonoscopy 2015 Impression #1 wellness exam.  Last colonoscopy reviewed numerous polyps discussed definitely needs one will work to help him out with this get it scheduled.  Diet exercise  discussed.  Shingles vaccine discussed and prescription given  2.  Type 2 diabetes A1c good discussed maintain same meds  3.  Chronic pain discussed  Impression: Chronic pain. Patient compliant with medication. No substantial side effects. [place]  controlled substance registry reviewed to ensure compliance and proper use of medication. Patient aware goal of medicine is not complete resolution of pain but to control his symptoms to improve his functional capacity. Aware of potential adverse side effects   4.  Depression clinically stable ongoing challenges benefits from medicines  5 hypertension discussed good control compliance discussed maintain same  Further recommendations based on blood work results.  GI referral.  Medications refilled.  Diet exercise discussed shingles vaccine   5.  Hypertension good control hyperlipidemia good control discussed maintain same

## 2017-12-04 ENCOUNTER — Ambulatory Visit: Payer: 59 | Admitting: Family Medicine

## 2017-12-04 ENCOUNTER — Encounter: Payer: Self-pay | Admitting: Family Medicine

## 2017-12-04 VITALS — BP 126/90 | HR 92 | Temp 98.3°F | Ht 68.0 in | Wt 236.0 lb

## 2017-12-04 DIAGNOSIS — J329 Chronic sinusitis, unspecified: Secondary | ICD-10-CM | POA: Diagnosis not present

## 2017-12-04 DIAGNOSIS — I480 Paroxysmal atrial fibrillation: Secondary | ICD-10-CM | POA: Diagnosis not present

## 2017-12-04 DIAGNOSIS — J310 Chronic rhinitis: Secondary | ICD-10-CM

## 2017-12-04 DIAGNOSIS — I10 Essential (primary) hypertension: Secondary | ICD-10-CM | POA: Diagnosis not present

## 2017-12-04 MED ORDER — AMOXICILLIN-POT CLAVULANATE 875-125 MG PO TABS: ORAL_TABLET | ORAL | 0 refills | Status: DC

## 2017-12-04 NOTE — Progress Notes (Signed)
   Subjective:    Patient ID: Shaun [name], male    DOB: 1956/01/23, 62 y.o.   MRN: [id number]  Sinusitis  This is a new problem. Episode onset: one week. Associated symptoms include congestion, coughing and headaches. (Wheezing) Treatments tried: zyrtec.   Bilateral foot swelling for the past 2 days.   Shortness of breath for about one week. 02 96%.    Review of Systems  HENT: Positive for congestion.   Respiratory: Positive for cough.   Neurological: Positive for headaches.       Objective:   Physical Exam Alert, mild malaise. Hydration good Vitals stable. frontal/ maxillary tenderness evident positive nasal congestion. pharynx normal neck supple  lungs clear/no crackles or wheezes. heart regular in rhythm  Base of the lungs clear.  Atrial fib in good control.  Trace edema.  Review of last echocardiogram reveals excellent ejection fraction      Assessment & Plan:  Impression rhinosinusitis likely post viral, discussed with patient. plan antibiotics prescribed. Questions answered. Symptomatic care discussed. warning signs discussed. WSL Patient here to get blood work as orderedb patient noting cramps, importance of getting tests encouraged.  Patient.  A bit diagnostically tricky.  Old records reviewed at length.  Despite trace swelling in both ankles doubt exacerbation of CHF.  Lungs completely clear.  No orthopnea.  Patient reports most of his respiratory discomfort from upper respiratory congestion will hold off on CHF workup rationale discussed  Greater than 50% of this 25 minute face to face visit was spent in counseling and discussion and coordination of care regarding the above diagnosis/diagnosies

## 2017-12-06 ENCOUNTER — Other Ambulatory Visit: Payer: Self-pay | Admitting: Family Medicine

## 2017-12-07 ENCOUNTER — Other Ambulatory Visit: Payer: Self-pay | Admitting: Family Medicine

## 2017-12-09 ENCOUNTER — Other Ambulatory Visit: Payer: Self-pay | Admitting: Family Medicine

## 2017-12-15 ENCOUNTER — Other Ambulatory Visit: Payer: Self-pay | Admitting: Family Medicine

## 2017-12-15 DIAGNOSIS — E119 Type 2 diabetes mellitus without complications: Secondary | ICD-10-CM | POA: Diagnosis not present

## 2017-12-15 DIAGNOSIS — Z125 Encounter for screening for malignant neoplasm of prostate: Secondary | ICD-10-CM | POA: Diagnosis not present

## 2017-12-15 DIAGNOSIS — I10 Essential (primary) hypertension: Secondary | ICD-10-CM | POA: Diagnosis not present

## 2017-12-15 DIAGNOSIS — E785 Hyperlipidemia, unspecified: Secondary | ICD-10-CM | POA: Diagnosis not present

## 2017-12-16 LAB — BASIC METABOLIC PANEL WITH GFR
BUN: 12 mg/dL (ref 7–25)
CALCIUM: 9.1 mg/dL (ref 8.6–10.3)
CO2: 28 mmol/L (ref 20–32)
CREATININE: 0.92 mg/dL (ref 0.70–1.25)
Chloride: 105 mmol/L (ref 98–110)
GFR, EST AFRICAN AMERICAN: 104 mL/min/{1.73_m2} (ref >=60)
GFR, EST NON AFRICAN AMERICAN: 89 mL/min/{1.73_m2} (ref >=60)
Glucose, Bld: 142 mg/dL — ABNORMAL HIGH (ref 65–99)
POTASSIUM: 4.7 mmol/L (ref 3.5–5.3)
Sodium: 140 mmol/L (ref 135–146)

## 2017-12-16 LAB — HEPATIC FUNCTION PANEL
AG RATIO: 2 (calc) (ref 1.0–2.5)
ALBUMIN MSPROF: 4 g/dL (ref 3.6–5.1)
ALT: 18 U/L (ref 9–46)
AST: 18 U/L (ref 10–35)
Alkaline phosphatase (APISO): 97 U/L (ref 40–115)
BILIRUBIN DIRECT: 0.1 mg/dL (ref 0.0–0.2)
BILIRUBIN TOTAL: 0.5 mg/dL (ref 0.2–1.2)
GLOBULIN: 2 g/dL (ref 1.9–3.7)
Indirect Bilirubin: 0.4 mg/dL (calc) (ref 0.2–1.2)
Total Protein: 6 g/dL — ABNORMAL LOW (ref 6.1–8.1)

## 2017-12-16 LAB — LIPID PANEL
CHOL/HDL RATIO: 4.8 (calc) (ref <5.0)
CHOLESTEROL: 133 mg/dL (ref <200)
HDL: 28 mg/dL — AB (ref >40)
LDL Cholesterol (Calc): 75 mg/dL (calc)
Non-HDL Cholesterol (Calc): 105 mg/dL (calc) (ref <130)
Triglycerides: 206 mg/dL — ABNORMAL HIGH (ref <150)

## 2017-12-16 LAB — MICROALBUMIN / CREATININE URINE RATIO
CREATININE, URINE: 282 mg/dL (ref 20–320)
MICROALB UR: 3.5 mg/dL
MICROALB/CREAT RATIO: 12 ug/mg{creat} (ref <30)

## 2017-12-16 LAB — PSA: PSA: 0.4 ng/mL (ref <=4.0)

## 2017-12-24 ENCOUNTER — Encounter: Payer: Self-pay | Admitting: Family Medicine

## 2018-01-04 ENCOUNTER — Other Ambulatory Visit: Payer: Self-pay | Admitting: Family Medicine

## 2018-01-26 ENCOUNTER — Other Ambulatory Visit: Payer: Self-pay | Admitting: Family Medicine

## 2018-02-05 ENCOUNTER — Other Ambulatory Visit: Payer: Self-pay | Admitting: Family Medicine

## 2018-02-13 ENCOUNTER — Encounter: Payer: Self-pay | Admitting: Family Medicine

## 2018-02-13 ENCOUNTER — Ambulatory Visit: Payer: 59 | Admitting: Family Medicine

## 2018-02-13 VITALS — BP 128/86 | Temp 98.2°F | Ht 68.0 in | Wt 241.6 lb

## 2018-02-13 DIAGNOSIS — J329 Chronic sinusitis, unspecified: Secondary | ICD-10-CM

## 2018-02-13 MED ORDER — BENZONATATE 100 MG PO CAPS: 100.0000 mg | ORAL_CAPSULE | Freq: Two times a day (BID) | ORAL | 0 refills | Status: DC | PRN

## 2018-02-13 MED ORDER — AMOXICILLIN-POT CLAVULANATE 875-125 MG PO TABS: ORAL_TABLET | ORAL | 0 refills | Status: DC

## 2018-02-13 NOTE — Progress Notes (Signed)
   Subjective:    Patient ID: Shaun [name], male    DOB: November 23, 1955, 62 y.o.   MRN: [id number]  Cough  This is a new problem. The current episode started yesterday. Associated symptoms include a fever, headaches, nasal congestion, a sore throat and wheezing. Treatments tried: tylenol.   Pos cough and cong and headache  Frontal headache   Used inhaler prn for wheezing   Dim energy  Bad cough triggering vomiting , dry heave      Review of Systems  Constitutional: Positive for fever.  HENT: Positive for sore throat.   Respiratory: Positive for cough and wheezing.   Neurological: Positive for headaches.       Objective:   Physical Exam  Alert, mild malaise. Hydration good Vitals stable. frontal/ maxillary tenderness evident positive nasal congestion. pharynx normal neck supple  lungs clear/no crackles or wheezes. heart regular in rhythm       Assessment & Plan:  Impression rhinosinusitis likely post viral, discussed with patient. plan antibiotics prescribed. Questions answered. Symptomatic care discussed. warning signs discussed. WSL

## 2018-02-16 ENCOUNTER — Encounter: Payer: Self-pay | Admitting: Family Medicine

## 2018-02-23 ENCOUNTER — Ambulatory Visit: Payer: 59 | Admitting: Family Medicine

## 2018-02-26 DIAGNOSIS — H40013 Open angle with borderline findings, low risk, bilateral: Secondary | ICD-10-CM | POA: Diagnosis not present

## 2018-02-26 LAB — HM DIABETES EYE EXAM

## 2018-02-27 ENCOUNTER — Ambulatory Visit: Payer: 59 | Admitting: Family Medicine

## 2018-02-27 ENCOUNTER — Encounter: Payer: Self-pay | Admitting: Family Medicine

## 2018-02-27 VITALS — BP 128/76 | Ht 68.0 in | Wt 237.0 lb

## 2018-02-27 DIAGNOSIS — E119 Type 2 diabetes mellitus without complications: Secondary | ICD-10-CM | POA: Diagnosis not present

## 2018-02-27 DIAGNOSIS — I10 Essential (primary) hypertension: Secondary | ICD-10-CM | POA: Diagnosis not present

## 2018-02-27 DIAGNOSIS — E785 Hyperlipidemia, unspecified: Secondary | ICD-10-CM | POA: Diagnosis not present

## 2018-02-27 DIAGNOSIS — I480 Paroxysmal atrial fibrillation: Secondary | ICD-10-CM | POA: Diagnosis not present

## 2018-02-27 LAB — POCT GLYCOSYLATED HEMOGLOBIN (HGB A1C): Hemoglobin A1C: 6.8 % — AB (ref 4.0–5.6)

## 2018-02-27 MED ORDER — HYDROCODONE-ACETAMINOPHEN 5-325 MG PO TABS: ORAL_TABLET | ORAL | 0 refills | Status: DC

## 2018-02-27 NOTE — Progress Notes (Signed)
   Subjective:    Patient ID: Shaun [name], male    DOB: 12/04/1955, 62 y.o.   MRN: [id number]  Diabetes  He presents for his follow-up diabetic visit. He has type 2 diabetes mellitus. Home blood sugar record trend: low 100's. He does not see a podiatrist.Eye exam is current (yesterday).   Results for orders placed or performed in visit on 02/27/18  POCT glycosylated hemoglobin (Hb A1C)  Result Value Ref Range   Hemoglobin A1C 6.8 (A) 4.0 - 5.6 %   HbA1c POC (<> result, manual entry)  4.0 - 5.6 %   HbA1c, POC (prediabetic range)  5.7 - 6.4 %   HbA1c, POC (controlled diabetic range)  0.0 - 7.0 %  HM DIABETES EYE EXAM  Result Value Ref Range   HM Diabetic Eye Exam No Retinopathy No Retinopathy    Patient claims compliance with diabetes medication. No obvious side effects. Reports no substantial low sugar spells. Most numbers are generally in good range when checked fasting. Generally does not miss a dose of medication. Watching diabetic diet closely  Blood pressure medicine and blood pressure levels reviewed today with patient. Compliant with blood pressure medicine. States does not miss a dose. No obvious side effects. Blood pressure generally good when checked elsewhere. Watching salt intake.   Patient continues to take lipid medication regularly. No obvious side effects from it. Generally does not miss a dose. Prior blood work results are reviewed with patient. Patient continues to work on fat intake in diet  Glu a little high   Seemed hgher on the abx,   More eercise with work   Review of Systems No headache, no major weight loss or weight gain, no chest pain no back pain abdominal pain no change in bowel habits complete ROS otherwise negative     Objective:   Physical Exam  Alert and oriented, vitals reviewed and stable, NAD ENT-TM's and ext canals WNL bilat via otoscopic exam Soft palate, tonsils and post pharynx WNL via oropharyngeal exam Neck-symmetric, no  masses; thyroid nonpalpable and nontender Pulmonary-no tachypnea or accessory muscle use; Clear without wheezes via auscultation Card--no abnrml murmurs, rhythm reg and rate WNL Carotid pulses symmetric, without bruits       Assessment & Plan:  Pain med, needs to go back to two per day Impression: Chronic pain. Patient compliant with medication. No substantial side effects. [place] controlled substance registry reviewed to ensure compliance and proper use of medication. Patient aware goal of medicine is not complete resolution of pain but to control his symptoms to improve his functional capacity. Aware of potential adverse side effects  Type 2 diabetes.  Good control discussed to maintain same meds rationale discussed  Hypertension good control discussed medication compliance discussed maintain same  Hyperlipidemia.  Patient concerned about numbers discussed overall good to maintain same  Atrial fibrillation discussed with patient answers given  Greater than 50% of this 25 minute face to face visit was spent in counseling and discussion and coordination of care regarding the above diagnosis/diagnosies

## 2018-03-07 ENCOUNTER — Other Ambulatory Visit: Payer: Self-pay | Admitting: Family Medicine

## 2018-04-06 ENCOUNTER — Other Ambulatory Visit: Payer: Self-pay | Admitting: Cardiology

## 2018-04-13 DIAGNOSIS — H40013 Open angle with borderline findings, low risk, bilateral: Secondary | ICD-10-CM | POA: Diagnosis not present

## 2018-04-16 ENCOUNTER — Ambulatory Visit: Payer: 59 | Admitting: Family Medicine

## 2018-04-16 ENCOUNTER — Encounter: Payer: Self-pay | Admitting: Family Medicine

## 2018-04-16 VITALS — BP 140/92 | Ht 68.0 in | Wt 240.6 lb

## 2018-04-16 DIAGNOSIS — R1310 Dysphagia, unspecified: Secondary | ICD-10-CM | POA: Diagnosis not present

## 2018-04-16 DIAGNOSIS — E119 Type 2 diabetes mellitus without complications: Secondary | ICD-10-CM | POA: Diagnosis not present

## 2018-04-16 DIAGNOSIS — F339 Major depressive disorder, recurrent, unspecified: Secondary | ICD-10-CM

## 2018-04-16 DIAGNOSIS — R079 Chest pain, unspecified: Secondary | ICD-10-CM

## 2018-04-16 DIAGNOSIS — R110 Nausea: Secondary | ICD-10-CM | POA: Diagnosis not present

## 2018-04-16 DIAGNOSIS — I10 Essential (primary) hypertension: Secondary | ICD-10-CM

## 2018-04-16 MED ORDER — ONDANSETRON 4 MG PO TBDP: ORAL_TABLET | ORAL | 0 refills | Status: DC

## 2018-04-16 MED ORDER — OMEPRAZOLE 40 MG PO CPDR: DELAYED_RELEASE_CAPSULE | ORAL | 5 refills | Status: DC

## 2018-04-16 MED ORDER — SUCRALFATE 1 G PO TABS: ORAL_TABLET | ORAL | 0 refills | Status: DC

## 2018-04-16 MED ORDER — BUPROPION HCL ER (SR) 150 MG PO TB12: ORAL_TABLET | ORAL | 5 refills | Status: DC

## 2018-04-16 NOTE — Progress Notes (Signed)
   Subjective:    Patient ID: Shaun [name], male    DOB: Feb 02, 1956, 62 y.o.   MRN: [id number] Patient arrives with numerous concerns HPI Pt here today for nausea and sweating going on for about one week; has became worse. Wheezing and abdominal pain, gas,fatigue, mood swings  Pt feeling sig nausea and burping ane gas  Twice so bad almost thought about the ambulance  Has been occasional in the pas but now severe   Pt gets to feeling dizzy with it   And also   Sweating sensation  Pt notes epigastric discomfort  Pt physically active at work, not hankling well   Pt has had an upper endoccpy  Pt occsionally gets food dysphagia, swallow s water   Felt really bad when the spell hits   Loses his color   Least littl thing "set him off "      Review of Systems No headache, no major weight loss or weight gain, no chest pain no back pain abdominal pain no change in bowel habits complete ROS otherwise negative     Objective:   Physical Exam  Alert and oriented, vitals reviewed and stable, intermittent belching burping during exam with not serious distress but clearly concerned and aggravated by ENT-TM's and ext canals WNL bilat via otoscopic exam Soft palate, tonsils and post pharynx WNL via oropharyngeal exam Neck-symmetric, no masses; thyroid nonpalpable and nontender Pulmonary-no tachypnea or accessory muscle use; Clear without wheezes via auscultation Card--no abnrml murmurs, rhythm reg and rate WNL Carotid pulses symmetric, without bruits Distinct epigastric discomfort.      Assessment & Plan:  Impression progressive GI symptomatology.  Solid food dysphasia.  Epigastric tenderness.  Worsening despite omeprazole.  Long discussion held.  Will double omeprazole.  Add Zofran and Carafate.  GI referral rationale discussed  2.  Depression clinically worsening discussed we will add Wellbutrin benefits discussed  Greater than 50% of this 25 minute face to face visit  was spent in counseling and discussion and coordination of care regarding the above diagnosis/diagnosies  Of note EKG stable atrial fibrillation controlled rate no significant ST changes

## 2018-04-17 ENCOUNTER — Telehealth: Payer: Self-pay | Admitting: *Deleted

## 2018-04-17 ENCOUNTER — Other Ambulatory Visit: Payer: Self-pay | Admitting: *Deleted

## 2018-04-17 MED ORDER — OMEPRAZOLE 40 MG PO CPDR: 40.0000 mg | DELAYED_RELEASE_CAPSULE | Freq: Every day | ORAL | 5 refills | Status: DC

## 2018-04-17 MED ORDER — RANITIDINE HCL 300 MG PO TABS: 300.0000 mg | ORAL_TABLET | Freq: Every day | ORAL | 5 refills | Status: DC

## 2018-04-17 NOTE — Telephone Encounter (Signed)
Fax from [employer]. Omeprazole 40mg  2 capsules daily is denied. His plan only covers 1 capsule per day. See decision note in dr steve's folder

## 2018-04-17 NOTE — Telephone Encounter (Signed)
Ok add zantac 300 qd and cont omep just once per day plus the carafate tid

## 2018-04-17 NOTE — Telephone Encounter (Signed)
Discussed with pt. Pt verbalized understanding. New rx for omeprazole one daily and zantac 300mg  sent to pharm.

## 2018-04-19 ENCOUNTER — Ambulatory Visit: Payer: 59 | Admitting: Family Medicine

## 2018-04-20 ENCOUNTER — Encounter: Payer: Self-pay | Admitting: Family Medicine

## 2018-04-27 ENCOUNTER — Other Ambulatory Visit: Payer: Self-pay | Admitting: Family Medicine

## 2018-05-03 ENCOUNTER — Other Ambulatory Visit: Payer: Self-pay | Admitting: Family Medicine

## 2018-05-05 ENCOUNTER — Other Ambulatory Visit: Payer: Self-pay | Admitting: Family Medicine

## 2018-05-05 ENCOUNTER — Other Ambulatory Visit: Payer: Self-pay | Admitting: Cardiology

## 2018-05-06 ENCOUNTER — Other Ambulatory Visit: Payer: Self-pay | Admitting: Family Medicine

## 2018-05-07 NOTE — Telephone Encounter (Signed)
Rx request sent to pharmacy.  

## 2018-05-07 NOTE — Telephone Encounter (Signed)
Due to medication interactions please forward this to Dr. Richardson Landry for his review

## 2018-05-08 NOTE — Telephone Encounter (Signed)
Please review per Dr. Nicki Reaper.

## 2018-05-14 ENCOUNTER — Other Ambulatory Visit: Payer: Self-pay | Admitting: Family Medicine

## 2018-05-21 ENCOUNTER — Encounter: Payer: Self-pay | Admitting: Gastroenterology

## 2018-05-21 ENCOUNTER — Telehealth: Payer: Self-pay | Admitting: *Deleted

## 2018-05-21 ENCOUNTER — Ambulatory Visit: Payer: 59 | Admitting: Gastroenterology

## 2018-05-21 ENCOUNTER — Encounter: Payer: Self-pay | Admitting: *Deleted

## 2018-05-21 ENCOUNTER — Telehealth: Payer: Self-pay | Admitting: Gastroenterology

## 2018-05-21 ENCOUNTER — Other Ambulatory Visit: Payer: Self-pay | Admitting: *Deleted

## 2018-05-21 VITALS — BP 121/83 | HR 74 | Temp 96.9°F | Ht 68.0 in | Wt 244.6 lb

## 2018-05-21 DIAGNOSIS — K219 Gastro-esophageal reflux disease without esophagitis: Secondary | ICD-10-CM

## 2018-05-21 DIAGNOSIS — Z86010 Personal history of colonic polyps: Secondary | ICD-10-CM | POA: Diagnosis not present

## 2018-05-21 DIAGNOSIS — R1310 Dysphagia, unspecified: Secondary | ICD-10-CM

## 2018-05-21 DIAGNOSIS — D649 Anemia, unspecified: Secondary | ICD-10-CM

## 2018-05-21 DIAGNOSIS — R1013 Epigastric pain: Secondary | ICD-10-CM

## 2018-05-21 DIAGNOSIS — K279 Peptic ulcer, site unspecified, unspecified as acute or chronic, without hemorrhage or perforation: Secondary | ICD-10-CM | POA: Insufficient documentation

## 2018-05-21 DIAGNOSIS — R1319 Other dysphagia: Secondary | ICD-10-CM

## 2018-05-21 MED ORDER — NA SULFATE-K SULFATE-MG SULF 17.5-3.13-1.6 GM/177ML PO SOLN: 1.0000 | ORAL | 0 refills | Status: DC

## 2018-05-21 NOTE — Telephone Encounter (Signed)
PA for procedures has been approved via [facility] website. reference number is [id number]

## 2018-05-21 NOTE — Assessment & Plan Note (Signed)
Several month history of epigastric pain, reflux, dysphagia.  Symptoms reminiscent to when he had a previous ulcer years ago.  Plan for EGD with possible esophageal dilation with deep sedation given polypharmacy.  I have discussed the risks, alternatives, benefits with regards to but not limited to the risk of reaction to medication, bleeding, infection, perforation and the patient is agreeable to proceed. Written consent to be obtained.  Patient is on Eliquis.  He has permanent A. fib, last cardiac stent 2016.  Plan to hold for 48 hours prior to EGD.  We will also discuss plans with his cardiologist due to history of numerous colon polyps, see assessment and plan for adenomatous colon polyps.

## 2018-05-21 NOTE — Telephone Encounter (Signed)
PT's wife, Jeannene Patella, is aware.

## 2018-05-21 NOTE — Progress Notes (Addendum)
Primary Care Physician:  Mikey Kirschner, MD  Primary Gastroenterologist:  Barney Drain, MD  REVIEWED-NO ADDITIONAL RECOMMENDATIONS.  Chief Complaint  Patient presents with  . Colonoscopy    consult  . Abdominal Pain    mid upper; Omeprazole and Carafate helping    HPI:  Shaun [name] is a 62 y.o. male here at the request of Dr. Wolfgang Phoenix for further evaluation of epigastric pain, dysphagia.  Patient is also overdue for surveillance colonoscopy.  His last colonoscopy was in September 2015.  He had 30 polyps removed.  Multiple tubular adenomas.  He was advised to have another colonoscopy in October 2016.  He was referred to genetic counseling where his genetic work-up was negative.  Patient presented back for additional colonoscopy in October 2016 however due to recent cardiac stenting it was advised to wait at least one year to avoid interruption of blood thinner.  Fortunately patient was lost to follow-up and was not able to be reached in August 2017.  He presents with couple month history of epigastric pain, reflux, indigestion.  Similar symptoms 20 years ago when he was diagnosed with an ulcer.  Epigastric pain worse with meals.  Associated with nausea but no vomiting.  Somewhat afraid to eat.  Some improvement on omeprazole and Carafate.  No unintentional weight loss.  Complains of difficulty swallowing with raw vegetables and sometimes pills.  Does okay with liquids.  Bowel movements are regular.  No melena or rectal bleeding.  Last CBC 1 year ago, microcytic anemia noted.    Current Outpatient Medications  Medication Sig Dispense Refill  . apixaban (ELIQUIS) 5 MG TABS tablet Take 1 tablet (5 mg total) by mouth 2 (two) times daily. 60 tablet 6  . atorvastatin (LIPITOR) 80 MG tablet Take 1 tablet (80 mg total) by mouth daily. 90 tablet 1  . benzonatate (TESSALON) 100 MG capsule Take 1 capsule (100 mg total) by mouth 2 (two) times daily as needed for cough. 30 capsule 0  . buPROPion  (WELLBUTRIN SR) 150 MG 12 hr tablet Take one each morning for 3 days then take one twice daily 60 tablet 5  . escitalopram (LEXAPRO) 20 MG tablet TAKE 1 TABLET(20 MG) BY MOUTH DAILY 30 tablet 5  . furosemide (LASIX) 20 MG tablet Take 1 tablet (20 mg total) by mouth daily. 90 tablet 3  . HYDROcodone-acetaminophen (NORCO/VICODIN) 5-325 MG tablet Take one bid prn pain 60 tablet 0  . HYDROcodone-homatropine (HYCODAN) 5-1.5 MG/5ML syrup Take 5 mLs by mouth every 6 (six) hours as needed for cough. 90 mL 0  . lisinopril (PRINIVIL,ZESTRIL) 5 MG tablet TAKE 1 TABLET BY MOUTH EVERY DAY 90 tablet 1  . meclizine (ANTIVERT) 25 MG tablet Take 1 tablet (25 mg total) by mouth 3 (three) times daily as needed for dizziness. 30 tablet 0  . metFORMIN (GLUCOPHAGE) 500 MG tablet Take 1 tablet (500 mg total) by mouth 2 (two) times daily with a meal. 360 tablet 0  . metFORMIN (GLUCOPHAGE) 500 MG tablet TAKE 1 TABLET(500 MG) BY MOUTH TWICE DAILY WITH A MEAL 60 tablet 5  . metoprolol tartrate (LOPRESSOR) 50 MG tablet TAKE 1 AND 1/2 TABLETS BY MOUTH TWICE DAILY 270 tablet 3  . nitroGLYCERIN (NITROSTAT) 0.4 MG SL tablet Place 1 tablet (0.4 mg total) under the tongue every 5 (five) minutes as needed. (Patient taking differently: Place 0.4 mg under the tongue every 5 (five) minutes as needed for chest pain. ) 25 tablet 3  . Omega-3 Fatty Acids (  FISH OIL PO) Take 1 capsule by mouth daily.     Marland Kitchen omeprazole (PRILOSEC) 40 MG capsule Take 1 capsule (40 mg total) by mouth daily. 30 capsule 5  . ondansetron (ZOFRAN ODT) 4 MG disintegrating tablet Take one every 8 hours as needed for nausea 20 tablet 0  . potassium chloride SA (K-DUR,KLOR-CON) 20 MEQ tablet Take 1 tablet (20 mEq total) by mouth daily. 14 tablet 0  . ranitidine (ZANTAC) 300 MG tablet Take 1 tablet (300 mg total) by mouth daily. 30 tablet 5  . rOPINIRole (REQUIP) 3 MG tablet TAKE 1 TABLET BY MOUTH EVERY NIGHT AT BEDTIME 90 tablet 1  . sildenafil (VIAGRA) 100 MG tablet  TAKE 1 TABLET BY MOUTH EVERY DAY AS NEEDED FOR ERECTILE DYSFUNCTION (Patient taking differently: Take 50 mg by mouth as needed for erectile dysfunction. TAKE 1 TABLET BY MOUTH EVERY DAY AS NEEDED FOR ERECTILE DYSFUNCTION) 10 tablet 0  . sodium chloride (OCEAN) 0.65 % SOLN nasal spray Place 1 spray into both nostrils as needed for congestion.    . sucralfate (CARAFATE) 1 g tablet TAKE 1 TABLET BY MOUTH THREE TIMES DAILY BEFORE MEALS 42 tablet 0  . traZODone (DESYREL) 50 MG tablet TAKE 2 TABLETS(100 MG) BY MOUTH AT BEDTIME 60 tablet 0  . traZODone (DESYREL) 50 MG tablet TAKE 2 TABLETS(100 MG) BY MOUTH AT BEDTIME 60 tablet 5  . triamcinolone cream (KENALOG) 0.1 % Apply 1 application topically 2 (two) times daily. (Patient taking differently: Apply 1 application topically daily as needed (for irritation). ) 60 g 1  . VENTOLIN HFA 108 (90 Base) MCG/ACT inhaler INHALE 2 PUFFS INTO THE LUNGS EVERY 6 HOURS AS NEEDED FOR WHEEZING OR SHORTNESS OF BREATH 18 g 0   No current facility-administered medications for this visit.     Allergies as of 05/21/2018  . (No Known Allergies)    Past Medical History:  Diagnosis Date  . Arteriosclerotic cardiovascular disease (ASCVD)    Nonobstructive; cath in 2000: 30-40% mid LAD and proximal RCA;normal EF. stress nuclear in 2006-subtle inferoseptal hypoperfusion with reversibility; negative stress EKG; good exercise tolerance  . Asthma   . CHF (congestive heart failure) ([place])   . Colon polyps    30 colon polyps found on first colonoscopy  . Diabetes mellitus without complication ([place])   . Hyperlipidemia   . Hypertension   . Insomnia   . Obstructive sleep apnea 12/2009   01/26/2010 AHI 83/hr  . Paroxysmal atrial fibrillation ([place]) 10/2004   Onset in 10/2004; recurred 09/2008  . PUD (peptic ulcer disease)    1980s  . RLS (restless legs syndrome)   . Sinusitis     Past Surgical History:  Procedure Laterality Date  . CARDIAC CATHETERIZATION N/A 07/21/2015    Procedure: Left Heart Cath and Coronary Angiography;  Surgeon: Peter M Martinique, MD;  Location: [facility];  Service: Cardiovascular;  Laterality: N/A;  . CARDIAC CATHETERIZATION N/A 07/21/2015   Procedure: Coronary Stent Intervention;  Surgeon: Peter M Martinique, MD;  Location: [facility];  Service: Cardiovascular;  Laterality: N/A;  . COLONOSCOPY N/A 05/19/2014   Dr. Barnie Alderman diverticulosis/moderate external hemorrhoids, >20 simple adenomas. Genetic screening negative.   Marland Kitchen HERNIA REPAIR  1986   Left inguinal  . INTRAVASCULAR PRESSURE WIRE/FFR STUDY Left 06/08/2017   Procedure: INTRAVASCULAR PRESSURE WIRE/FFR STUDY;  Surgeon: Nelva Bush, MD;  Location: [facility];  Service: Cardiovascular;  Laterality: Left;  LAD and CFX  . LEFT HEART CATH AND CORONARY  ANGIOGRAPHY N/A 06/08/2017   Procedure: LEFT HEART CATH AND CORONARY ANGIOGRAPHY;  Surgeon: Nelva Bush, MD;  Location: [facility];  Service: Cardiovascular;  Laterality: N/A;  . ROTATOR CUFF REPAIR     Right    Family History  Problem Relation Age of Onset  . Hypertension Mother   . Breast cancer Mother 21  . Heart attack Father   . Diabetes Brother   . Skin cancer Sister 3  . Brain cancer Maternal Uncle   . Cancer Maternal Uncle        NOS  . Breast cancer Cousin        maternal cousin dx <50  . Colon cancer Neg Hx     Social History   Socioeconomic History  . Marital status: Married    Spouse name: Not on file  . Number of children: 5  . Years of education: Not on file  . Highest education level: Not on file  Occupational History  . Occupation: employed    Fish farm manager: [employer]    Comment: full-time  Social Needs  . Financial resource strain: Not on file  . Food insecurity:    Worry: Not on file    Inability: Not on file  . Transportation needs:    Medical: Not on file    Non-medical: Not on file  Tobacco Use  . Smoking status: Current Every Day Smoker    Packs/day: 1.00     Years: 32.00    Pack years: 32.00    Types: Cigarettes    Start date: 07/24/1970  . Smokeless tobacco: Never Used  Substance and Sexual Activity  . Alcohol use: No    Alcohol/week: 0.0 standard drinks  . Drug use: No  . Sexual activity: Yes    Partners: Female  Lifestyle  . Physical activity:    Days per week: Not on file    Minutes per session: Not on file  . Stress: Not on file  Relationships  . Social connections:    Talks on phone: Not on file    Gets together: Not on file    Attends religious service: Not on file    Active member of club or organization: Not on file    Attends meetings of clubs or organizations: Not on file    Relationship status: Not on file  . Intimate partner violence:    Fear of current or ex partner: Not on file    Emotionally abused: Not on file    Physically abused: Not on file    Forced sexual activity: Not on file  Other Topics Concern  . Not on file  Social History Narrative  . Not on file      ROS:  General: Negative for anorexia, weight loss, fever, chills, fatigue, weakness. Eyes: Negative for vision changes.  ENT: Negative for hoarseness,nasal congestion.  See HPI CV: Negative for chest pain, angina, palpitations, dyspnea on exertion, peripheral edema.  Respiratory: Negative for dyspnea at rest, dyspnea on exertion, cough, sputum, wheezing.  GI: See history of present illness. GU:  Negative for dysuria, hematuria, urinary incontinence, urinary frequency, nocturnal urination.  MS: Negative for joint pain, low back pain.  Derm: Negative for rash or itching.  Neuro: Negative for weakness, abnormal sensation, seizure, frequent headaches, memory loss, confusion.  Psych: Negative for anxiety, depression, suicidal ideation, hallucinations.  Endo: Negative for unusual weight change.  Heme: Negative for bruising or bleeding. Allergy: Negative for rash or hives.    Physical Examination:  BP  121/83   Pulse 74   Temp (!) 96.9 F (36.1  C) (Oral)   Ht 5\' 8"  (1.727 m)   Wt 244 lb 9.6 oz (110.9 kg)   BMI 37.19 kg/m    General: Well-nourished, well-developed in no acute distress.  Head: Normocephalic, atraumatic.   Eyes: Conjunctiva pink, no icterus. Mouth: Oropharyngeal mucosa moist and pink , no lesions erythema or exudate. Neck: Supple without thyromegaly, masses, or lymphadenopathy.  Lungs: Clear to auscultation bilaterally.  Heart: irregularly irregular rate and rhythm, no murmurs rubs or gallops.  Abdomen: Bowel sounds are normal, mild epigastric tenderness, nondistended, no hepatosplenomegaly or masses, no abdominal bruits or    hernia , no rebound or guarding.   Rectal: Not performed Extremities: No lower extremity edema. No clubbing or deformities.  Neuro: Alert and oriented x 4 , grossly normal neurologically.  Skin: Warm and dry, no rash or jaundice.   Psych: Alert and cooperative, normal mood and affect.  Labs: Lab Results  Component Value Date   CREATININE 0.92 12/15/2017   BUN 12 12/15/2017   NA 140 12/15/2017   K 4.7 12/15/2017   CL 105 12/15/2017   CO2 28 12/15/2017   Lab Results  Component Value Date   ALT 18 12/15/2017   AST 18 12/15/2017   ALKPHOS 98 10/27/2016   BILITOT 0.5 12/15/2017   Lab Results  Component Value Date   WBC 8.7 06/12/2017   HGB 10.2 (L) 06/12/2017   HCT 33.7 (L) 06/12/2017   MCV 73.6 (L) 06/12/2017   PLT 179 06/12/2017   Lab Results  Component Value Date   HGBA1C 6.8 (A) 02/27/2018     Imaging Studies: No results found.

## 2018-05-21 NOTE — Assessment & Plan Note (Signed)
>>  ASSESSMENT AND PLAN FOR H/O ADENOMATOUS POLYP OF COLON WRITTEN ON 05/21/2018 10:27 AM BY LEWIS, LESLIE S, PA-C  62 year old gentleman adenomatous colon polyps, greater than 20 polyps removed at time of colonoscopy in 2015, overdue for surveillance procedure.  He presented in 2016 for one-year follow-up but due to recent cardiac stent at that time and the need to avoid interrupted blood thinners, we had to postpone his procedure.  He was lost to follow-up for 1 year follow-up in 2017.  One year ago he had microcytic anemia.  Will update labs.  Plan to pursue colonoscopy in the near future.  Plan for deep sedation due to polypharmacy.  I have discussed the risks, alternatives, benefits with regards to but not limited to the risk of reaction to medication, bleeding, infection, perforation and the patient is agreeable to proceed. Written consent to be obtained.  He is on Eliquis, permanent A. fib.  Last cardiac stent in 2016.  He would need to hold Eliquis 48 hours prior to colonoscopy and based on colonoscopy findings recommendations for resuming Eliquis to be provided. Potential delay if polypectomy performed. Will address with his cardiologist.    Microcytic anemia last year.  Update labs.

## 2018-05-21 NOTE — Telephone Encounter (Signed)
Patient is scheduled to have upper endoscopy and colonoscopy in November. We need him to be off Eliquis 48 hours prior to the procedure and possibly longer after the procedure if he has numerous colon polyps removed (last time he had 30 removed). Because of his permanent Afib, I am reaching out to cardiology to make aware of anticoagulation disruptions.

## 2018-05-21 NOTE — Patient Instructions (Signed)
Colonoscopy and upper endoscopy as scheduled. See separate instructions.

## 2018-05-21 NOTE — Telephone Encounter (Signed)
Please let patient know that Dr. Stanford Breed is aware of our plans to hold Eliquis 48 hours prior to procedure.

## 2018-05-21 NOTE — Progress Notes (Signed)
cc'd  Too pcp

## 2018-05-21 NOTE — Assessment & Plan Note (Addendum)
62 year old gentleman adenomatous colon polyps, greater than 20 polyps removed at time of colonoscopy in 2015, overdue for surveillance procedure.  He presented in 2016 for one-year follow-up but due to recent cardiac stent at that time and the need to avoid interrupted blood thinners, we had to postpone his procedure.  He was lost to follow-up for 1 year follow-up in 2017.  One year ago he had microcytic anemia.  Will update labs.  Plan to pursue colonoscopy in the near future.  Plan for deep sedation due to polypharmacy.  I have discussed the risks, alternatives, benefits with regards to but not limited to the risk of reaction to medication, bleeding, infection, perforation and the patient is agreeable to proceed. Written consent to be obtained.  He is on Eliquis, permanent A. fib.  Last cardiac stent in 2016.  He would need to hold Eliquis 48 hours prior to colonoscopy and based on colonoscopy findings recommendations for resuming Eliquis to be provided. Potential delay if polypectomy performed. Will address with his cardiologist.    Microcytic anemia last year.  Update labs.

## 2018-05-21 NOTE — Telephone Encounter (Signed)
Pre-op scheduled for 07/10/18 at 9:00am. Letter mailed. LMOVM.

## 2018-05-21 NOTE — Telephone Encounter (Signed)
DC apixaban 2 days prior to procedure and resume following as soon as possible Kirk Ruths

## 2018-05-25 ENCOUNTER — Other Ambulatory Visit: Payer: Self-pay | Admitting: Family Medicine

## 2018-06-05 ENCOUNTER — Other Ambulatory Visit: Payer: Self-pay | Admitting: Family Medicine

## 2018-06-05 DIAGNOSIS — M1611 Unilateral primary osteoarthritis, right hip: Secondary | ICD-10-CM | POA: Diagnosis not present

## 2018-06-06 ENCOUNTER — Telehealth: Payer: Self-pay | Admitting: Family Medicine

## 2018-06-06 ENCOUNTER — Other Ambulatory Visit: Payer: Self-pay | Admitting: Family Medicine

## 2018-06-06 NOTE — Telephone Encounter (Signed)
Patient needs refill on hydrocodone 5/325 ,he had to reschedule appointment from 10/10 to 10/24 for 3 month follow up

## 2018-06-07 ENCOUNTER — Ambulatory Visit: Payer: 59 | Admitting: Family Medicine

## 2018-06-07 ENCOUNTER — Other Ambulatory Visit: Payer: Self-pay | Admitting: *Deleted

## 2018-06-07 MED ORDER — HYDROCODONE-ACETAMINOPHEN 5-325 MG PO TABS: ORAL_TABLET | ORAL | 0 refills | Status: DC

## 2018-06-07 NOTE — Telephone Encounter (Signed)
Script printed. Await dr Serena Croissant

## 2018-06-07 NOTE — Telephone Encounter (Signed)
Pt notified script at the front for pickup.

## 2018-06-07 NOTE — Telephone Encounter (Signed)
One mo ok 

## 2018-06-10 ENCOUNTER — Other Ambulatory Visit: Payer: Self-pay | Admitting: Family Medicine

## 2018-06-20 DIAGNOSIS — M1611 Unilateral primary osteoarthritis, right hip: Secondary | ICD-10-CM | POA: Insufficient documentation

## 2018-06-21 ENCOUNTER — Ambulatory Visit: Payer: 59 | Admitting: Family Medicine

## 2018-06-21 ENCOUNTER — Encounter: Payer: Self-pay | Admitting: Family Medicine

## 2018-06-21 VITALS — BP 126/82 | Ht 68.0 in | Wt 241.0 lb

## 2018-06-21 DIAGNOSIS — E785 Hyperlipidemia, unspecified: Secondary | ICD-10-CM

## 2018-06-21 DIAGNOSIS — F321 Major depressive disorder, single episode, moderate: Secondary | ICD-10-CM

## 2018-06-21 DIAGNOSIS — Z23 Encounter for immunization: Secondary | ICD-10-CM

## 2018-06-21 DIAGNOSIS — Z79899 Other long term (current) drug therapy: Secondary | ICD-10-CM

## 2018-06-21 DIAGNOSIS — I10 Essential (primary) hypertension: Secondary | ICD-10-CM | POA: Diagnosis not present

## 2018-06-21 DIAGNOSIS — E119 Type 2 diabetes mellitus without complications: Secondary | ICD-10-CM | POA: Diagnosis not present

## 2018-06-21 LAB — POCT GLYCOSYLATED HEMOGLOBIN (HGB A1C): HEMOGLOBIN A1C: 6.9 % — AB (ref 4.0–5.6)

## 2018-06-21 MED ORDER — OMEPRAZOLE 40 MG PO CPDR: 40.0000 mg | DELAYED_RELEASE_CAPSULE | Freq: Every day | ORAL | 5 refills | Status: DC

## 2018-06-21 MED ORDER — HYDROCODONE-ACETAMINOPHEN 5-325 MG PO TABS: ORAL_TABLET | ORAL | 0 refills | Status: DC

## 2018-06-21 MED ORDER — BUPROPION HCL ER (SR) 150 MG PO TB12: ORAL_TABLET | ORAL | 5 refills | Status: DC

## 2018-06-21 MED ORDER — ATORVASTATIN CALCIUM 80 MG PO TABS: 80.0000 mg | ORAL_TABLET | Freq: Every day | ORAL | 1 refills | Status: DC

## 2018-06-21 MED ORDER — ROPINIROLE HCL 3 MG PO TABS: 3.0000 mg | ORAL_TABLET | Freq: Every day | ORAL | 1 refills | Status: DC

## 2018-06-21 NOTE — Progress Notes (Signed)
Subjective:    Patient ID: Shaun [name], male    DOB: [birth year], 62 y.o.   MRN: [id number]  Diabetes  He presents for his follow-up diabetic visit. He has type 2 diabetes mellitus. Current diabetic treatment includes oral agent (monotherapy). He is compliant with treatment all of the time. His weight is stable. He is following a diabetic diet. He has not had a previous visit with a dietitian. He does not see a podiatrist.Eye exam is current.   Results for orders placed or performed in visit on 06/21/18  POCT glycosylated hemoglobin (Hb A1C)  Result Value Ref Range   Hemoglobin A1C 6.9 (A) 4.0 - 5.6 %   HbA1c POC (<> result, manual entry)     HbA1c, POC (prediabetic range)     HbA1c, POC (controlled diabetic range)      Rising up to numbers fasting such as 120s and 130s , a bit higher than usual    Patient continues to take lipid medication regularly. No obvious side effects from it. Generally does not miss a dose. Prior blood work results are reviewed with patient. Patient continues to work on fat intake in diet  Blood pressure medicine and blood pressure levels reviewed today with patient. Compliant with blood pressure medicine. States does not miss a dose. No obvious side effects. Blood pressure generally good when checked elsewhere. Watching salt intake.   Patient notes ongoing compliance with antidepressant medication. No obvious side effects. Reports does not miss a dose. Overall continues to help depression substantially. No thoughts of homicide or suicide. Would like to maintain medication.  Patient claims compliance with diabetes medication. No obvious side effects. Reports no substantial low sugar spells. Most numbers are generally in good range when checked fasting. Generally does not miss a dose of medication. Watching diabetic diet closely  Diet overall is better   Exercise not the best  Seeing ortho for the hip and hoping for improvement   Work oderately  stressful, leaning  Pt a bit   Patient compliant with pain medication. Continues to experience the pain which led to initiation of analgesic intervention. No significant negative side effects. States definitely needs the pain medication to maintain current level of functioning. Does not receive controlled substance pain medication elsewhere.   gkids busy, but poveral fine, has developed more of  Review of Systems No headache, no major weight loss or weight gain, no chest pain no back pain abdominal pain no change in bowel habits complete ROS otherwise negative     Objective:   Physical Exam Alert and oriented, vitals reviewed and stable, NAD ENT-TM's and ext canals WNL bilat via otoscopic exam Soft palate, tonsils and post pharynx WNL via oropharyngeal exam Neck-symmetric, no masses; thyroid nonpalpable and nontender Pulmonary-no tachypnea or accessory muscle use; Clear without wheezes via auscultation Card--no abnrml murmurs, rhythm not reg and rate controlledWNL Carotid pulses symmetric, without bruits        Assessment & Plan:  Impression 1 type 2 diabetes.  Good control discussed maintain same therapy  2.  Hypertension.  Good control.  Compliance discussed maintain same  3.  Hyperlipidemia.  Prior blood work reviewed appropriate blood work ordered compliance discussed maintain meds  4.  Depression clinically stable but not ideal.  To medication.  Discussion held.  Advised patient if worsens concern to psychiatrist.  Currently working full-time plus health issues also taking care of 2 g children no suicidal thoughts  5.Impression: Chronic pain. Patient compliant with medication. No substantial  side effects. [place] controlled substance registry reviewed to ensure compliance and proper use of medication. Patient aware goal of medicine is not complete resolution of pain but to control his symptoms to improve his functional capacity. Aware of potential adverse side  effects  6.  Chronic paroxysmal atrial fibrillation.  Followed by cardiologist  Greater than 50% of this 25 minute face to face visit was spent in counseling and discussion and coordination of care regarding the above diagnosis/diagnosies  Further recommendations based on blood work

## 2018-06-22 ENCOUNTER — Other Ambulatory Visit: Payer: Self-pay | Admitting: Family Medicine

## 2018-07-04 NOTE — Patient Instructions (Signed)
Shaun [name]  07/04/2018     @PREFPERIOPPHARMACY @   Your procedure is scheduled on  07/17/2018 .  Report to [facility] at  [place]   A.M.  Call this number if you have problems the morning of surgery:  [phone]   Remember:                        Take these medicines the morning of surgery with A SIP OF WATER  Wellbutrin, lexapro, hydrocodone ( if needed), lisinopril, antivert ( if needed), metoprolol, prilosec, zofran( if needed), zantac. Use your inhaler before you come. Follow any instructions given to you by Dr Nona Dell office.    Do not wear jewelry, make-up or nail polish.  Do not wear lotions, powders, or perfumes, or deodorant.  Do not shave 48 hours prior to surgery.  Men may shave face and neck.  Do not bring valuables to the hospital.  [facility] is not responsible for any belongings or valuables.  Contacts, dentures or bridgework may not be worn into surgery.  Leave your suitcase in the car.  After surgery it may be brought to your room.  For patients admitted to the hospital, discharge time will be determined by your treatment team.  Patients discharged the day of surgery will not be allowed to drive home.   Name and phone number of your driver:   None Special instructions:  None  Please read over the following fact sheets that you were given. Anesthesia Post-op Instructions and Care and Recovery After Surgery       Esophagogastroduodenoscopy Esophagogastroduodenoscopy (EGD) is a procedure to examine the lining of the esophagus, stomach, and first part of the small intestine (duodenum). This procedure is done to check for problems such as inflammation, bleeding, ulcers, or growths. During this procedure, a long, flexible, lighted tube with a camera attached (endoscope) is inserted down the throat. Tell a health care provider about:  Any allergies you have.  All medicines you are taking, including vitamins, herbs, eye drops,  creams, and over-the-counter medicines.  Any problems you or family members have had with anesthetic medicines.  Any blood disorders you have.  Any surgeries you have had.  Any medical conditions you have.  Whether you are pregnant or may be pregnant. What are the risks? Generally, this is a safe procedure. However, problems may occur, including:  Infection.  Bleeding.  A tear (perforation) in the esophagus, stomach, or duodenum.  Trouble breathing.  Excessive sweating.  Spasms of the larynx.  A slowed heartbeat.  Low blood pressure.  What happens before the procedure?  Follow instructions from your health care provider about eating or drinking restrictions.  Ask your health care provider about: ? Changing or stopping your regular medicines. This is especially important if you are taking diabetes medicines or blood thinners. ? Taking medicines such as aspirin and ibuprofen. These medicines can thin your blood. Do not take these medicines before your procedure if your health care provider instructs you not to.  Plan to have someone take you home after the procedure.  If you wear dentures, be ready to remove them before the procedure. What happens during the procedure?  To reduce your risk of infection, your health care team will wash or sanitize their hands.  An IV tube will be put in a vein in your hand or arm. You will get  medicines and fluids through this tube.  You will be given one or more of the following: ? A medicine to help you relax (sedative). ? A medicine to numb the area (local anesthetic). This medicine may be sprayed into your throat. It will make you feel more comfortable and keep you from gagging or coughing during the procedure. ? A medicine for pain.  A mouth guard may be placed in your mouth to protect your teeth and to keep you from biting on the endoscope.  You will be asked to lie on your left side.  The endoscope will be lowered down your  throat into your esophagus, stomach, and duodenum.  Air will be put into the endoscope. This will help your health care provider see better.  The lining of your esophagus, stomach, and duodenum will be examined.  Your health care provider may: ? Take a tissue sample so it can be looked at in a lab (biopsy). ? Remove growths. ? Remove objects (foreign bodies) that are stuck. ? Treat any bleeding with medicines or other devices that stop tissue from bleeding. ? Widen (dilate) or stretch narrowed areas of your esophagus and stomach.  The endoscope will be taken out. The procedure may vary among health care providers and hospitals. What happens after the procedure?  Your blood pressure, heart rate, breathing rate, and blood oxygen level will be monitored often until the medicines you were given have worn off.  Do not eat or drink anything until the numbing medicine has worn off and your gag reflex has returned. This information is not intended to replace advice given to you by your health care provider. Make sure you discuss any questions you have with your health care provider. Document Released: 12/16/2004 Document Revised: 01/21/2016 Document Reviewed: 07/09/2015 Elsevier Interactive Patient Education  2018 [employer]. Esophagogastroduodenoscopy, Care After Refer to this sheet in the next few weeks. These instructions provide you with information about caring for yourself after your procedure. Your health care provider may also give you more specific instructions. Your treatment has been planned according to current medical practices, but problems sometimes occur. Call your health care provider if you have any problems or questions after your procedure. What can I expect after the procedure? After the procedure, it is common to have:  A sore throat.  Nausea.  Bloating.  Dizziness.  Fatigue.  Follow these instructions at home:  Do not eat or drink anything until the numbing  medicine (local anesthetic) has worn off and your gag reflex has returned. You will know that the local anesthetic has worn off when you can swallow comfortably.  Do not drive for 24 hours if you received a medicine to help you relax (sedative).  If your health care provider took a tissue sample for testing during the procedure, make sure to get your test results. This is your responsibility. Ask your health care provider or the department performing the test when your results will be ready.  Keep all follow-up visits as told by your health care provider. This is important. Contact a health care provider if:  You cannot stop coughing.  You are not urinating.  You are urinating less than usual. Get help right away if:  You have trouble swallowing.  You cannot eat or drink.  You have throat or chest pain that gets worse.  You are dizzy or light-headed.  You faint.  You have nausea or vomiting.  You have chills.  You have a fever.  You  have severe abdominal pain.  You have black, tarry, or bloody stools. This information is not intended to replace advice given to you by your health care provider. Make sure you discuss any questions you have with your health care provider. Document Released: 08/01/2012 Document Revised: 01/21/2016 Document Reviewed: 07/09/2015 Elsevier Interactive Patient Education  2018 [employer].  Esophageal Dilatation Esophageal dilatation is a procedure to open a blocked or narrowed part of the esophagus. The esophagus is the long tube in your throat that carries food and liquid from your mouth to your stomach. The procedure is also called esophageal dilation. You may need this procedure if you have a buildup of scar tissue in your esophagus that makes it difficult, painful, or even impossible to swallow. This can be caused by gastroesophageal reflux disease (GERD). In rare cases, people need this procedure because they have cancer of the esophagus or a  problem with the way food moves through the esophagus. Sometimes you may need to have another dilatation to enlarge the opening of the esophagus gradually. Tell a health care provider about:  Any allergies you have.  All medicines you are taking, including vitamins, herbs, eye drops, creams, and over-the-counter medicines.  Any problems you or family members have had with anesthetic medicines.  Any blood disorders you have.  Any surgeries you have had.  Any medical conditions you have.  Any antibiotic medicines you are required to take before dental procedures. What are the risks? Generally, this is a safe procedure. However, problems can occur and include:  Bleeding from a tear in the lining of the esophagus.  A hole (perforation) in the esophagus.  What happens before the procedure?  Do not eat or drink anything after midnight on the night before the procedure or as directed by your health care provider.  Ask your health care provider about changing or stopping your regular medicines. This is especially important if you are taking diabetes medicines or blood thinners.  Plan to have someone take you home after the procedure. What happens during the procedure?  You will be given a medicine that makes you relaxed and sleepy (sedative).  A medicine may be sprayed or gargled to numb the back of the throat.  Your health care provider can use various instruments to do an esophageal dilatation. During the procedure, the instrument used will be placed in your mouth and passed down into your esophagus. Options include: ? Simple dilators. This instrument is carefully placed in the esophagus to stretch it. ? Guided wire bougies. In this method, a flexible tube (endoscope) is used to insert a wire into the esophagus. The dilator is passed over this wire to enlarge the esophagus. Then the wire is removed. ? Balloon dilators. An endoscope with a small balloon at the end is passed down into  the esophagus. Inflating the balloon gently stretches the esophagus and opens it up. What happens after the procedure?  Your blood pressure, heart rate, breathing rate, and blood oxygen level will be monitored often until the medicines you were given have worn off.  Your throat may feel slightly sore and will probably still feel numb. This will improve slowly over time.  You will not be allowed to eat or drink until the throat numbness has resolved.  If this is a same-day procedure, you may be allowed to go home once you have been able to drink, urinate, and sit on the edge of the bed without nausea or dizziness.  If this is a  same-day procedure, you should have a friend or family member with you for the next 24 hours after the procedure. This information is not intended to replace advice given to you by your health care provider. Make sure you discuss any questions you have with your health care provider. Document Released: 10/06/2005 Document Revised: 01/21/2016 Document Reviewed: 12/25/2013 Elsevier Interactive Patient Education  [employer].  Colonoscopy, Adult A colonoscopy is an exam to look at the large intestine. It is done to check for problems, such as:  Lumps (tumors).  Growths (polyps).  Swelling (inflammation).  Bleeding.  What happens before the procedure? Eating and drinking Follow instructions from your doctor about eating and drinking. These instructions may include:  A few days before the procedure - follow a low-fiber diet. ? Avoid nuts. ? Avoid seeds. ? Avoid dried fruit. ? Avoid raw fruits. ? Avoid vegetables.  1-3 days before the procedure - follow a clear liquid diet. Avoid liquids that have red or purple dye. Drink only clear liquids, such as: ? Clear broth or bouillon. ? Black coffee or tea. ? Clear juice. ? Clear soft drinks or sports drinks. ? Gelatin dessert. ? Popsicles.  On the day of the procedure - do not eat or drink anything  during the 2 hours before the procedure.  Bowel prep If you were prescribed an oral bowel prep:  Take it as told by your doctor. Starting the day before your procedure, you will need to drink a lot of liquid. The liquid will cause you to poop (have bowel movements) until your poop is almost clear or light green.  If your skin or butt gets irritated from diarrhea, you may: ? Wipe the area with wipes that have medicine in them, such as adult wet wipes with aloe and vitamin E. ? Put something on your skin that soothes the area, such as petroleum jelly.  If you throw up (vomit) while drinking the bowel prep, take a break for up to 60 minutes. Then begin the bowel prep again. If you keep throwing up and you cannot take the bowel prep without throwing up, call your doctor.  General instructions  Ask your doctor about changing or stopping your normal medicines. This is important if you take diabetes medicines or blood thinners.  Plan to have someone take you home from the hospital or clinic. What happens during the procedure?  An IV tube may be put into one of your veins.  You will be given medicine to help you relax (sedative).  To reduce your risk of infection: ? Your doctors will wash their hands. ? Your anal area will be washed with soap.  You will be asked to lie on your side with your knees bent.  Your doctor will get a long, thin, flexible tube ready. The tube will have a camera and a light on the end.  The tube will be put into your anus.  The tube will be gently put into your large intestine.  Air will be delivered into your large intestine to keep it open. You may feel some pressure or cramping.  The camera will be used to take photos.  A small tissue sample may be removed from your body to be looked at under a microscope (biopsy). If any possible problems are found, the tissue will be sent to a lab for testing.  If small growths are found, your doctor may remove them and  have them checked for cancer.  The tube that was  put into your anus will be slowly removed. The procedure may vary among doctors and hospitals. What happens after the procedure?  Your doctor will check on you often until the medicines you were given have worn off.  Do not drive for 24 hours after the procedure.  You may have a small amount of blood in your poop.  You may pass gas.  You may have mild cramps or bloating in your belly (abdomen).  It is up to you to get the results of your procedure. Ask your doctor, or the department performing the procedure, when your results will be ready. This information is not intended to replace advice given to you by your health care provider. Make sure you discuss any questions you have with your health care provider. Document Released: 09/17/2010 Document Revised: 06/15/2016 Document Reviewed: 10/27/2015 Elsevier Interactive Patient Education  2017 [employer]  Colonoscopy, Adult, Care After This sheet gives you information about how to care for yourself after your procedure. Your health care provider may also give you more specific instructions. If you have problems or questions, contact your health care provider. What can I expect after the procedure? After the procedure, it is common to have:  A small amount of blood in your stool for 24 hours after the procedure.  Some gas.  Mild abdominal cramping or bloating.  Follow these instructions at home: General instructions   For the first 24 hours after the procedure: ? Do not drive or use machinery. ? Do not sign important documents. ? Do not drink alcohol. ? Do your regular daily activities at a slower pace than normal. ? Eat soft, easy-to-digest foods. ? Rest often.  Take over-the-counter or prescription medicines only as told by your health care provider.  It is up to you to get the results of your procedure. Ask your health care provider, or the department performing the  procedure, when your results will be ready. Relieving cramping and bloating  Try walking around when you have cramps or feel bloated.  Apply heat to your abdomen as told by your health care provider. Use a heat source that your health care provider recommends, such as a moist heat pack or a heating pad. ? Place a towel between your skin and the heat source. ? Leave the heat on for 20-30 minutes. ? Remove the heat if your skin turns bright red. This is especially important if you are unable to feel pain, heat, or cold. You may have a greater risk of getting burned. Eating and drinking  Drink enough fluid to keep your urine clear or pale yellow.  Resume your normal diet as instructed by your health care provider. Avoid heavy or fried foods that are hard to digest.  Avoid drinking alcohol for as long as instructed by your health care provider. Contact a health care provider if:  You have blood in your stool 2-3 days after the procedure. Get help right away if:  You have more than a small spotting of blood in your stool.  You pass large blood clots in your stool.  Your abdomen is swollen.  You have nausea or vomiting.  You have a fever.  You have increasing abdominal pain that is not relieved with medicine. This information is not intended to replace advice given to you by your health care provider. Make sure you discuss any questions you have with your health care provider. Document Released: 03/29/2004 Document Revised: 05/09/2016 Document Reviewed: 10/27/2015 Elsevier Interactive Patient Education  2018  [place] Anesthesia is a term that refers to techniques, procedures, and medicines that help a person stay safe and comfortable during a medical procedure. Monitored anesthesia care, or sedation, is one type of anesthesia. Your anesthesia specialist may recommend sedation if you will be having a procedure that does not require you to be unconscious,  such as:  Cataract surgery.  A dental procedure.  A biopsy.  A colonoscopy.  During the procedure, you may receive a medicine to help you relax (sedative). There are three levels of sedation:  Mild sedation. At this level, you may feel awake and relaxed. You will be able to follow directions.  Moderate sedation. At this level, you will be sleepy. You may not remember the procedure.  Deep sedation. At this level, you will be asleep. You will not remember the procedure.  The more medicine you are given, the deeper your level of sedation will be. Depending on how you respond to the procedure, the anesthesia specialist may change your level of sedation or the type of anesthesia to fit your needs. An anesthesia specialist will monitor you closely during the procedure. Let your health care provider know about:  Any allergies you have.  All medicines you are taking, including vitamins, herbs, eye drops, creams, and over-the-counter medicines.  Any use of steroids (by mouth or as a cream).  Any problems you or family members have had with sedatives and anesthetic medicines.  Any blood disorders you have.  Any surgeries you have had.  Any medical conditions you have, such as sleep apnea.  Whether you are pregnant or may be pregnant.  Any use of cigarettes, alcohol, or street drugs. What are the risks? Generally, this is a safe procedure. However, problems may occur, including:  Getting too much medicine (oversedation).  Nausea.  Allergic reaction to medicines.  Trouble breathing. If this happens, a breathing tube may be used to help with breathing. It will be removed when you are awake and breathing on your own.  Heart trouble.  Lung trouble.  Before the procedure Staying hydrated Follow instructions from your health care provider about hydration, which may include:  Up to 2 hours before the procedure - you may continue to drink clear liquids, such as water, clear  fruit juice, black coffee, and plain tea.  Eating and drinking restrictions Follow instructions from your health care provider about eating and drinking, which may include:  8 hours before the procedure - stop eating heavy meals or foods such as meat, fried foods, or fatty foods.  6 hours before the procedure - stop eating light meals or foods, such as toast or cereal.  6 hours before the procedure - stop drinking milk or drinks that contain milk.  2 hours before the procedure - stop drinking clear liquids.  Medicines Ask your health care provider about:  Changing or stopping your regular medicines. This is especially important if you are taking diabetes medicines or blood thinners.  Taking medicines such as aspirin and ibuprofen. These medicines can thin your blood. Do not take these medicines before your procedure if your health care provider instructs you not to.  Tests and exams  You will have a physical exam.  You may have blood tests done to show: ? How well your kidneys and liver are working. ? How well your blood can clot.  General instructions  Plan to have someone take you home from the hospital or clinic.  If you will be going  home right after the procedure, plan to have someone with you for 24 hours.  What happens during the procedure?  Your blood pressure, heart rate, breathing, level of pain and overall condition will be monitored.  An IV tube will be inserted into one of your veins.  Your anesthesia specialist will give you medicines as needed to keep you comfortable during the procedure. This may mean changing the level of sedation.  The procedure will be performed. After the procedure  Your blood pressure, heart rate, breathing rate, and blood oxygen level will be monitored until the medicines you were given have worn off.  Do not drive for 24 hours if you received a sedative.  You may: ? Feel sleepy, clumsy, or nauseous. ? Feel forgetful about what  happened after the procedure. ? Have a sore throat if you had a breathing tube during the procedure. ? Vomit. This information is not intended to replace advice given to you by your health care provider. Make sure you discuss any questions you have with your health care provider. Document Released: 05/11/2005 Document Revised: 01/22/2016 Document Reviewed: 12/06/2015 Elsevier Interactive Patient Education  2018 [place], Care After These instructions provide you with information about caring for yourself after your procedure. Your health care provider may also give you more specific instructions. Your treatment has been planned according to current medical practices, but problems sometimes occur. Call your health care provider if you have any problems or questions after your procedure. What can I expect after the procedure? After your procedure, it is common to:  Feel sleepy for several hours.  Feel clumsy and have poor balance for several hours.  Feel forgetful about what happened after the procedure.  Have poor judgment for several hours.  Feel nauseous or vomit.  Have a sore throat if you had a breathing tube during the procedure.  Follow these instructions at home: For at least 24 hours after the procedure:   Do not: ? Participate in activities in which you could fall or become injured. ? Drive. ? Use heavy machinery. ? Drink alcohol. ? Take sleeping pills or medicines that cause drowsiness. ? Make important decisions or sign legal documents. ? Take care of children on your own.  Rest. Eating and drinking  Follow the diet that is recommended by your health care provider.  If you vomit, drink water, juice, or soup when you can drink without vomiting.  Make sure you have little or no nausea before eating solid foods. General instructions  Have a responsible adult stay with you until you are awake and alert.  Take over-the-counter and  prescription medicines only as told by your health care provider.  If you smoke, do not smoke without supervision.  Keep all follow-up visits as told by your health care provider. This is important. Contact a health care provider if:  You keep feeling nauseous or you keep vomiting.  You feel light-headed.  You develop a rash.  You have a fever. Get help right away if:  You have trouble breathing. This information is not intended to replace advice given to you by your health care provider. Make sure you discuss any questions you have with your health care provider. Document Released: 12/06/2015 Document Revised: 04/06/2016 Document Reviewed: 12/06/2015 Elsevier Interactive Patient Education  [employer].

## 2018-07-06 DIAGNOSIS — R1013 Epigastric pain: Secondary | ICD-10-CM | POA: Diagnosis not present

## 2018-07-06 DIAGNOSIS — D649 Anemia, unspecified: Secondary | ICD-10-CM | POA: Diagnosis not present

## 2018-07-06 DIAGNOSIS — K219 Gastro-esophageal reflux disease without esophagitis: Secondary | ICD-10-CM | POA: Diagnosis not present

## 2018-07-06 DIAGNOSIS — R1310 Dysphagia, unspecified: Secondary | ICD-10-CM | POA: Diagnosis not present

## 2018-07-09 LAB — CBC WITH DIFFERENTIAL/PLATELET
BASOS ABS: 0.1 10*3/uL (ref 0.0–0.2)
BASOS: 1 %
EOS (ABSOLUTE): 0.1 10*3/uL (ref 0.0–0.4)
Eos: 1 %
HEMOGLOBIN: 10.8 g/dL — AB (ref 13.0–17.7)
Hematocrit: 34.1 % — ABNORMAL LOW (ref 37.5–51.0)
IMMATURE GRANS (ABS): 0 10*3/uL (ref 0.0–0.1)
IMMATURE GRANULOCYTES: 0 %
Lymphocytes Absolute: 2.3 10*3/uL (ref 0.7–3.1)
Lymphs: 27 %
MCH: 22.2 pg — AB (ref 26.6–33.0)
MCHC: 31.7 g/dL (ref 31.5–35.7)
MCV: 70 fL — AB (ref 79–97)
MONOCYTES: 7 %
Monocytes Absolute: 0.6 10*3/uL (ref 0.1–0.9)
NEUTROS ABS: 5.2 10*3/uL (ref 1.4–7.0)
NEUTROS PCT: 64 %
PLATELETS: 190 10*3/uL (ref 150–450)
RBC: 4.86 x10E6/uL (ref 4.14–5.80)
RDW: 18.1 % — ABNORMAL HIGH (ref 12.3–15.4)
WBC: 8.3 10*3/uL (ref 3.4–10.8)

## 2018-07-09 LAB — IRON,TIBC AND FERRITIN PANEL
FERRITIN: 6 ng/mL — AB (ref 30–400)
IRON: 40 ug/dL (ref 38–169)
Iron Saturation: 9 % — CL (ref 15–55)
TIBC: 467 ug/dL — AB (ref 250–450)
UIBC: 427 ug/dL — AB (ref 111–343)

## 2018-07-10 ENCOUNTER — Encounter (HOSPITAL_COMMUNITY): Payer: Self-pay

## 2018-07-10 ENCOUNTER — Encounter (HOSPITAL_COMMUNITY)
Admission: RE | Admit: 2018-07-10 | Discharge: 2018-07-10 | Disposition: A | Discharge status: Home / Self Care | Payer: 59 | Source: Ambulatory Visit | Attending: Gastroenterology | Admitting: Gastroenterology

## 2018-07-10 ENCOUNTER — Other Ambulatory Visit: Payer: Self-pay

## 2018-07-10 DIAGNOSIS — Z01812 Encounter for preprocedural laboratory examination: Secondary | ICD-10-CM | POA: Diagnosis not present

## 2018-07-10 HISTORY — DX: Personal history of urinary calculi: Z87.442

## 2018-07-10 HISTORY — DX: Gastro-esophageal reflux disease without esophagitis: K21.9

## 2018-07-10 LAB — HEPATIC FUNCTION PANEL
ALT: 22 U/L (ref 0–44)
AST: 18 U/L (ref 15–41)
Albumin: 3.8 g/dL (ref 3.5–5.0)
Alkaline Phosphatase: 90 U/L (ref 38–126)
BILIRUBIN INDIRECT: 0.5 mg/dL (ref 0.3–0.9)
Bilirubin, Direct: 0.1 mg/dL (ref 0.0–0.2)
Total Bilirubin: 0.6 mg/dL (ref 0.3–1.2)
Total Protein: 6.9 g/dL (ref 6.5–8.1)

## 2018-07-10 LAB — BASIC METABOLIC PANEL
ANION GAP: 9 (ref 5–15)
BUN: 14 mg/dL (ref 8–23)
CALCIUM: 8.7 mg/dL — AB (ref 8.9–10.3)
CO2: 22 mmol/L (ref 22–32)
Chloride: 107 mmol/L (ref 98–111)
Creatinine, Ser: 1.06 mg/dL (ref 0.61–1.24)
GLUCOSE: 150 mg/dL — AB (ref 70–99)
Potassium: 4.3 mmol/L (ref 3.5–5.1)
SODIUM: 138 mmol/L (ref 135–145)

## 2018-07-10 LAB — CBC
HCT: 35.8 % — ABNORMAL LOW (ref 39.0–52.0)
Hemoglobin: 10.3 g/dL — ABNORMAL LOW (ref 13.0–17.0)
MCH: 21.2 pg — AB (ref 26.0–34.0)
MCHC: 28.8 g/dL — ABNORMAL LOW (ref 30.0–36.0)
MCV: 73.7 fL — ABNORMAL LOW (ref 80.0–100.0)
NRBC: 0 % (ref 0.0–0.2)
PLATELETS: 211 10*3/uL (ref 150–400)
RBC: 4.86 MIL/uL (ref 4.22–5.81)
RDW: 19.2 % — ABNORMAL HIGH (ref 11.5–15.5)
WBC: 6.9 10*3/uL (ref 4.0–10.5)

## 2018-07-10 LAB — LIPID PANEL
Cholesterol: 136 mg/dL (ref 0–200)
HDL: 28 mg/dL — ABNORMAL LOW (ref >40)
LDL Cholesterol: 59 mg/dL (ref 0–99)
TRIGLYCERIDES: 247 mg/dL — AB (ref <150)
Total CHOL/HDL Ratio: 4.9 RATIO
VLDL: 49 mg/dL — ABNORMAL HIGH (ref 0–40)

## 2018-07-10 LAB — IRON AND TIBC
Iron: 27 ug/dL — ABNORMAL LOW (ref 45–182)
SATURATION RATIOS: 5 % — AB (ref 17.9–39.5)
TIBC: 524 ug/dL — AB (ref 250–450)
UIBC: 497 ug/dL

## 2018-07-10 LAB — FERRITIN: FERRITIN: 4 ng/mL — AB (ref 24–336)

## 2018-07-10 NOTE — Progress Notes (Signed)
PT is aware.

## 2018-07-11 ENCOUNTER — Telehealth: Payer: Self-pay | Admitting: Gastroenterology

## 2018-07-11 NOTE — Progress Notes (Signed)
PT is aware.

## 2018-07-11 NOTE — Telephone Encounter (Signed)
See result note. Pt is aware.  

## 2018-07-11 NOTE — Progress Notes (Signed)
LMOM to call.

## 2018-07-11 NOTE — Telephone Encounter (Signed)
Pt was returning a call from [place]. [phone]

## 2018-07-13 ENCOUNTER — Other Ambulatory Visit: Payer: Self-pay | Admitting: Cardiology

## 2018-07-17 ENCOUNTER — Encounter (HOSPITAL_COMMUNITY)
Admission: RE | Disposition: A | Discharge status: Home / Self Care | Payer: Self-pay | Source: Ambulatory Visit | Attending: Gastroenterology

## 2018-07-17 ENCOUNTER — Other Ambulatory Visit: Payer: Self-pay

## 2018-07-17 ENCOUNTER — Ambulatory Visit (HOSPITAL_COMMUNITY): Payer: 59 | Admitting: Anesthesiology

## 2018-07-17 ENCOUNTER — Encounter (HOSPITAL_COMMUNITY): Payer: Self-pay | Admitting: *Deleted

## 2018-07-17 ENCOUNTER — Ambulatory Visit (HOSPITAL_COMMUNITY)
Admission: RE | Admit: 2018-07-17 | Discharge: 2018-07-17 | Disposition: A | Discharge status: Home / Self Care | Payer: 59 | Source: Ambulatory Visit | Attending: Gastroenterology | Admitting: Gastroenterology

## 2018-07-17 DIAGNOSIS — D123 Benign neoplasm of transverse colon: Secondary | ICD-10-CM | POA: Diagnosis not present

## 2018-07-17 DIAGNOSIS — G4700 Insomnia, unspecified: Secondary | ICD-10-CM | POA: Insufficient documentation

## 2018-07-17 DIAGNOSIS — Z7901 Long term (current) use of anticoagulants: Secondary | ICD-10-CM | POA: Insufficient documentation

## 2018-07-17 DIAGNOSIS — K219 Gastro-esophageal reflux disease without esophagitis: Secondary | ICD-10-CM | POA: Diagnosis not present

## 2018-07-17 DIAGNOSIS — K319 Disease of stomach and duodenum, unspecified: Secondary | ICD-10-CM | POA: Diagnosis not present

## 2018-07-17 DIAGNOSIS — G4733 Obstructive sleep apnea (adult) (pediatric): Secondary | ICD-10-CM | POA: Insufficient documentation

## 2018-07-17 DIAGNOSIS — Z86010 Personal history of colonic polyps: Secondary | ICD-10-CM | POA: Insufficient documentation

## 2018-07-17 DIAGNOSIS — F17210 Nicotine dependence, cigarettes, uncomplicated: Secondary | ICD-10-CM | POA: Insufficient documentation

## 2018-07-17 DIAGNOSIS — I509 Heart failure, unspecified: Secondary | ICD-10-CM | POA: Insufficient documentation

## 2018-07-17 DIAGNOSIS — R1319 Other dysphagia: Secondary | ICD-10-CM

## 2018-07-17 DIAGNOSIS — J45909 Unspecified asthma, uncomplicated: Secondary | ICD-10-CM | POA: Diagnosis not present

## 2018-07-17 DIAGNOSIS — Z48815 Encounter for surgical aftercare following surgery on the digestive system: Secondary | ICD-10-CM | POA: Insufficient documentation

## 2018-07-17 DIAGNOSIS — I480 Paroxysmal atrial fibrillation: Secondary | ICD-10-CM | POA: Insufficient documentation

## 2018-07-17 DIAGNOSIS — D122 Benign neoplasm of ascending colon: Secondary | ICD-10-CM | POA: Diagnosis not present

## 2018-07-17 DIAGNOSIS — Z7984 Long term (current) use of oral hypoglycemic drugs: Secondary | ICD-10-CM | POA: Insufficient documentation

## 2018-07-17 DIAGNOSIS — K2970 Gastritis, unspecified, without bleeding: Secondary | ICD-10-CM | POA: Diagnosis not present

## 2018-07-17 DIAGNOSIS — R1310 Dysphagia, unspecified: Secondary | ICD-10-CM | POA: Diagnosis not present

## 2018-07-17 DIAGNOSIS — K222 Esophageal obstruction: Secondary | ICD-10-CM | POA: Diagnosis not present

## 2018-07-17 DIAGNOSIS — E119 Type 2 diabetes mellitus without complications: Secondary | ICD-10-CM | POA: Diagnosis not present

## 2018-07-17 DIAGNOSIS — D125 Benign neoplasm of sigmoid colon: Secondary | ICD-10-CM | POA: Insufficient documentation

## 2018-07-17 DIAGNOSIS — K30 Functional dyspepsia: Secondary | ICD-10-CM | POA: Insufficient documentation

## 2018-07-17 DIAGNOSIS — I2510 Atherosclerotic heart disease of native coronary artery without angina pectoris: Secondary | ICD-10-CM | POA: Insufficient documentation

## 2018-07-17 DIAGNOSIS — K648 Other hemorrhoids: Secondary | ICD-10-CM | POA: Insufficient documentation

## 2018-07-17 DIAGNOSIS — D124 Benign neoplasm of descending colon: Secondary | ICD-10-CM | POA: Insufficient documentation

## 2018-07-17 DIAGNOSIS — D509 Iron deficiency anemia, unspecified: Secondary | ICD-10-CM | POA: Diagnosis not present

## 2018-07-17 DIAGNOSIS — K5730 Diverticulosis of large intestine without perforation or abscess without bleeding: Secondary | ICD-10-CM | POA: Diagnosis not present

## 2018-07-17 DIAGNOSIS — Z6836 Body mass index (BMI) 36.0-36.9, adult: Secondary | ICD-10-CM | POA: Insufficient documentation

## 2018-07-17 DIAGNOSIS — E785 Hyperlipidemia, unspecified: Secondary | ICD-10-CM | POA: Insufficient documentation

## 2018-07-17 DIAGNOSIS — Z955 Presence of coronary angioplasty implant and graft: Secondary | ICD-10-CM | POA: Insufficient documentation

## 2018-07-17 DIAGNOSIS — K644 Residual hemorrhoidal skin tags: Secondary | ICD-10-CM | POA: Diagnosis not present

## 2018-07-17 DIAGNOSIS — E669 Obesity, unspecified: Secondary | ICD-10-CM | POA: Insufficient documentation

## 2018-07-17 DIAGNOSIS — I110 Hypertensive heart disease with heart failure: Secondary | ICD-10-CM | POA: Insufficient documentation

## 2018-07-17 DIAGNOSIS — G2581 Restless legs syndrome: Secondary | ICD-10-CM | POA: Insufficient documentation

## 2018-07-17 DIAGNOSIS — Z8711 Personal history of peptic ulcer disease: Secondary | ICD-10-CM | POA: Insufficient documentation

## 2018-07-17 DIAGNOSIS — R1013 Epigastric pain: Secondary | ICD-10-CM

## 2018-07-17 HISTORY — PX: BIOPSY: SHX5522

## 2018-07-17 HISTORY — PX: COLONOSCOPY WITH PROPOFOL: SHX5780

## 2018-07-17 HISTORY — PX: ESOPHAGOGASTRODUODENOSCOPY (EGD) WITH PROPOFOL: SHX5813

## 2018-07-17 HISTORY — PX: POLYPECTOMY: SHX5525

## 2018-07-17 HISTORY — PX: SAVORY DILATION: SHX5439

## 2018-07-17 LAB — GLUCOSE, CAPILLARY
GLUCOSE-CAPILLARY: 102 mg/dL — AB (ref 70–99)
Glucose-Capillary: 112 mg/dL — ABNORMAL HIGH (ref 70–99)

## 2018-07-17 SURGERY — COLONOSCOPY WITH PROPOFOL
Anesthesia: General

## 2018-07-17 MED ORDER — MINERAL OIL PO OIL
TOPICAL_OIL | ORAL | Status: AC
  Filled 2018-07-17: qty 30

## 2018-07-17 MED ORDER — EPHEDRINE SULFATE 50 MG/ML IJ SOLN
INTRAMUSCULAR | Status: AC
  Filled 2018-07-17: qty 1

## 2018-07-17 MED ORDER — RANITIDINE HCL 300 MG PO TABS: 300.0000 mg | ORAL_TABLET | Freq: Every day | ORAL | 5 refills | Status: DC

## 2018-07-17 MED ORDER — STERILE WATER FOR IRRIGATION IR SOLN
Status: DC | PRN
  Administered 2018-07-17: 1.5 mL

## 2018-07-17 MED ORDER — PROPOFOL 500 MG/50ML IV EMUL
INTRAVENOUS | Status: DC | PRN
  Administered 2018-07-17: 12:00:00 via INTRAVENOUS
  Administered 2018-07-17: 75 ug/kg/min via INTRAVENOUS
  Administered 2018-07-17: 13:00:00 via INTRAVENOUS

## 2018-07-17 MED ORDER — PROPOFOL 10 MG/ML IV BOLUS
INTRAVENOUS | Status: DC | PRN
  Administered 2018-07-17: 15 mg via INTRAVENOUS
  Administered 2018-07-17: 20 mg via INTRAVENOUS
  Administered 2018-07-17: 15 mg via INTRAVENOUS

## 2018-07-17 MED ORDER — SODIUM CHLORIDE (PF) 0.9 % IJ SOLN
PREFILLED_SYRINGE | INTRAMUSCULAR | Status: DC | PRN
  Administered 2018-07-17: 2 mL

## 2018-07-17 MED ORDER — CHLORHEXIDINE GLUCONATE CLOTH 2 % EX PADS: 6.0000 | MEDICATED_PAD | Freq: Once | CUTANEOUS | Status: DC

## 2018-07-17 MED ORDER — HYDROCODONE-ACETAMINOPHEN 7.5-325 MG PO TABS: 1.0000 | ORAL_TABLET | Freq: Once | ORAL | Status: DC | PRN

## 2018-07-17 MED ORDER — HYDROMORPHONE HCL 1 MG/ML IJ SOLN: 0.2500 mg | INTRAMUSCULAR | Status: DC | PRN

## 2018-07-17 MED ORDER — PROMETHAZINE HCL 25 MG/ML IJ SOLN: 6.2500 mg | INTRAMUSCULAR | Status: DC | PRN

## 2018-07-17 MED ORDER — KETAMINE HCL 10 MG/ML IJ SOLN
INTRAMUSCULAR | Status: DC | PRN
  Administered 2018-07-17 (×4): 10 mg via INTRAVENOUS

## 2018-07-17 MED ORDER — EPHEDRINE SULFATE 50 MG/ML IJ SOLN
INTRAMUSCULAR | Status: DC | PRN
  Administered 2018-07-17: 5 mg via INTRAVENOUS

## 2018-07-17 MED ORDER — MIDAZOLAM HCL 2 MG/2ML IJ SOLN: 0.5000 mg | Freq: Once | INTRAMUSCULAR | Status: DC | PRN

## 2018-07-17 MED ORDER — KETAMINE HCL 50 MG/5ML IJ SOSY
PREFILLED_SYRINGE | INTRAMUSCULAR | Status: AC
  Filled 2018-07-17: qty 5

## 2018-07-17 MED ORDER — PROPOFOL 10 MG/ML IV BOLUS
INTRAVENOUS | Status: AC
  Filled 2018-07-17: qty 40

## 2018-07-17 MED ORDER — SODIUM CHLORIDE (PF) 0.9 % IJ SOLN
INTRAMUSCULAR | Status: AC
  Filled 2018-07-17: qty 10

## 2018-07-17 MED ORDER — EPINEPHRINE PF 1 MG/10ML IJ SOSY
PREFILLED_SYRINGE | INTRAMUSCULAR | Status: AC
  Filled 2018-07-17: qty 10

## 2018-07-17 MED ORDER — MIDAZOLAM HCL 5 MG/5ML IJ SOLN
INTRAMUSCULAR | Status: DC | PRN
  Administered 2018-07-17 (×2): 1 mg via INTRAVENOUS

## 2018-07-17 MED ORDER — POLYSACCHARIDE IRON COMPLEX 150 MG PO CAPS: ORAL_CAPSULE | ORAL | 0 refills | Status: DC

## 2018-07-17 MED ORDER — LACTATED RINGERS IV SOLN
INTRAVENOUS | Status: DC
  Administered 2018-07-17 (×2): via INTRAVENOUS

## 2018-07-17 MED ORDER — MIDAZOLAM HCL 2 MG/2ML IJ SOLN
INTRAMUSCULAR | Status: AC
  Filled 2018-07-17: qty 2

## 2018-07-17 NOTE — Op Note (Addendum)
[facility] Patient Name: Shaun [name] Procedure Date: 07/17/2018 12:51 PM MRN: [id number] Date of Birth: [birth year] Attending MD: Barney Drain MD, MD CSN: [id number] Age: 62 Admit Type: Outpatient Procedure:                Upper GI endoscopy WITH COLD FORCEPS/ESOPHAGEAL                            DILATION Indications:              Functional Dyspepsia, Dysphagia Providers:                Barney Drain MD, MD, Gwenlyn Fudge RN, RN, Janeece Riggers, RN, Gerome Sam, RN, Randa Spike,                            Technician, Nelma Rothman, Technician Referring MD:             Rosemary Holms, MD Medicines:                Propofol per Anesthesia Complications:            No immediate complications. Estimated Blood Loss:     Estimated blood loss was minimal. Procedure:                Pre-Anesthesia Assessment:                           - Prior to the procedure, a History and Physical                            was performed, and patient medications and                            allergies were reviewed. The patient's tolerance of                            previous anesthesia was also reviewed. The risks                            and benefits of the procedure and the sedation                            options and risks were discussed with the patient.                            All questions were answered, and informed consent                            was obtained. Prior Anticoagulants: The patient has                            taken Eliquis (apixaban), last dose was 2 days  prior to procedure. ASA Grade Assessment: II - A                            patient with mild systemic disease. After reviewing                            the risks and benefits, the patient was deemed in                            satisfactory condition to undergo the procedure.                            After obtaining informed consent, the endoscope was                             passed under direct vision. Throughout the                            procedure, the patient's blood pressure, pulse, and                            oxygen saturations were monitored continuously. The                            [id number]-H190 ([id number]) scope was introduced through the                            mouth, and advanced to the second part of duodenum.                            The upper GI endoscopy was accomplished without                            difficulty. The patient tolerated the procedure                            well. Scope In: 12:55:54 PM Scope Out: 1:02:06 PM Total Procedure Duration: 0 hours 6 minutes 12 seconds  Findings:      A low-grade of narrowing Schatzki ring was found at the gastroesophageal       junction. A guidewire was placed and the scope was withdrawn. Dilation       was performed with a Savary dilator with mild resistance at 16 mm and 17       mm. NO HEME,      Patchy mild inflammation characterized by congestion (edema) and       erythema was found in the gastric antrum. Biopsies were taken with a       cold forceps for Helicobacter pylori testing.      The examined duodenum was normal. Impression:               - DYSPHAGIA DUE TO Low-grade of narrowing Schatzki                            ring/GERD. Dilated.                           -  Gastritis. Biopsied. Moderate Sedation:      Per Anesthesia Care Recommendation:           - Patient has a contact number available for                            emergencies. The signs and symptoms of potential                            delayed complications were discussed with the                            patient. Return to normal activities tomorrow.                            Written discharge instructions were provided to the                            patient.                           - High fiber diet and low fat diet.                           - Continue present medications.  RESTART ELIQUIS NOV                            20                           - Await pathology results.                           - Return to my office in 4 months. NEEDS MANOMETRY                            IF DYSPHAGIA PERSISTS. Procedure Code(s):        --- Professional ---                           [phone], Esophagogastroduodenoscopy, flexible,                            transoral; with insertion of guide wire followed by                            passage of dilator(s) through esophagus over guide                            wire                           72536, 55, Esophagogastroduodenoscopy, flexible,                            transoral; with biopsy, single or multiple Diagnosis Code(s):        --- Professional ---  K22.2, Esophageal obstruction                           K29.70, Gastritis, unspecified, without bleeding                           K30, Functional dyspepsia                           R13.10, Dysphagia, unspecified CPT copyright 2018 American Medical Association. All rights reserved. The codes documented in this report are preliminary and upon coder review may  be revised to meet current compliance requirements. Barney Drain, MD Barney Drain MD, MD 07/17/2018 1:41:00 PM This report has been signed electronically. Number of Addenda: 0

## 2018-07-17 NOTE — Anesthesia Preprocedure Evaluation (Signed)
Anesthesia Evaluation  Patient identified by MRN, date of birth, ID band Patient awake    Reviewed: Allergy & Precautions, NPO status , Patient's Chart, lab work & pertinent test results  Airway Mallampati: III     Mouth opening: Limited Mouth Opening  Dental  (+) Poor Dentition, Missing   Pulmonary asthma , sleep apnea and Continuous Positive Airway Pressure Ventilation , COPD,  COPD inhaler, Current Smoker,  C/w smoking  Smoked earlier  today  Rarely uses inhalers -states last ~4 months ago    Pulmonary exam normal        Cardiovascular Exercise Tolerance: Poor hypertension, Pt. on medications + angina with exertion + CAD and +CHF  + dysrhythmias Atrial Fibrillation II  Limited ET States never cardioverted  Latest EF 10/18 ~ 60% States last stent 2016 States last SL NTG over a year ago    Neuro/Psych Depression negative neurological ROS  negative psych ROS   GI/Hepatic Neg liver ROS, PUD, GERD  Medicated and Controlled,  Endo/Other  negative endocrine ROSdiabetes, Type 2  Renal/GU negative Renal ROS  negative genitourinary   Musculoskeletal negative musculoskeletal ROS (+)   Abdominal (+) + obese,   Peds negative pediatric ROS (+)  Hematology negative hematology ROS (+)   Anesthesia Other Findings   Reproductive/Obstetrics negative OB ROS                             Anesthesia Physical Anesthesia Plan  ASA: IV  Anesthesia Plan: General   Post-op Pain Management:    Induction: Intravenous  PONV Risk Score and Plan:   Airway Management Planned: Nasal Cannula and Simple Face Mask  Additional Equipment:   Intra-op Plan:   Post-operative Plan:   Informed Consent: I have reviewed the patients History and Physical, chart, labs and discussed the procedure including the risks, benefits and alternatives for the proposed anesthesia with the patient or authorized representative  who has indicated his/her understanding and acceptance.   Dental advisory given  Plan Discussed with: CRNA  Anesthesia Plan Comments:         Anesthesia Quick Evaluation

## 2018-07-17 NOTE — H&P (Signed)
Primary Care Physician:  Mikey Kirschner, MD Primary Gastroenterologist:  Dr. Oneida Alar  Pre-Procedure History & Physical: HPI:  Shaun [name] is a 62 y.o. male here for [facility] DEFICIENCY ANEMIA.  Past Medical History:  Diagnosis Date  . Arteriosclerotic cardiovascular disease (ASCVD)    Nonobstructive; cath in 2000: 30-40% mid LAD and proximal RCA;normal EF. stress nuclear in 2006-subtle inferoseptal hypoperfusion with reversibility; negative stress EKG; good exercise tolerance  . Asthma   . CHF (congestive heart failure) ([place])   . Colon polyps    30 colon polyps found on first colonoscopy  . Diabetes mellitus without complication ([place])   . GERD (gastroesophageal reflux disease)   . History of kidney stones   . Hyperlipidemia   . Hypertension   . Insomnia   . Obstructive sleep apnea 12/2009   01/26/2010 AHI 83/hr  . Paroxysmal atrial fibrillation ([place]) 10/2004   Onset in 10/2004; recurred 09/2008  . PUD (peptic ulcer disease)    1980s  . RLS (restless legs syndrome)   . Sinusitis     Past Surgical History:  Procedure Laterality Date  . CARDIAC CATHETERIZATION N/A 07/21/2015   Procedure: Left Heart Cath and Coronary Angiography;  Surgeon: Peter M Martinique, MD;  Location: [place] [facility];  Service: Cardiovascular;  Laterality: N/A;  . CARDIAC CATHETERIZATION N/A 07/21/2015   Procedure: Coronary Stent Intervention;  Surgeon: Peter M Martinique, MD;  Location: [place] [facility];  Service: Cardiovascular;  Laterality: N/A;  . COLONOSCOPY N/A 05/19/2014   Dr. Barnie Alderman diverticulosis/moderate external hemorrhoids, >20 simple adenomas. Genetic screening negative.   Marland Kitchen HERNIA REPAIR  1986   Left inguinal  . INTRAVASCULAR PRESSURE WIRE/FFR STUDY Left 06/08/2017   Procedure: INTRAVASCULAR PRESSURE WIRE/FFR STUDY;  Surgeon: Nelva Bush, MD;  Location: [place] [facility];  Service: Cardiovascular;  Laterality: Left;  LAD and CFX  . LEFT HEART CATH AND CORONARY  ANGIOGRAPHY N/A 06/08/2017   Procedure: LEFT HEART CATH AND CORONARY ANGIOGRAPHY;  Surgeon: Nelva Bush, MD;  Location: [place] [facility];  Service: Cardiovascular;  Laterality: N/A;  . ROTATOR CUFF REPAIR     Right    Prior to Admission medications   Medication Sig Start Date End Date Taking? Authorizing Provider  apixaban (ELIQUIS) 5 MG TABS tablet Take 1 tablet (5 mg total) by mouth 2 (two) times daily. 09/04/17  Yes BranchAlphonse Guild, MD  atorvastatin (LIPITOR) 80 MG tablet Take 1 tablet (80 mg total) by mouth daily. 06/21/18  Yes Mikey Kirschner, MD  buPROPion [facility] SR) 150 MG 12 hr tablet Take one each morning for 3 days then take one twice daily Patient taking differently: Take 150 mg by mouth 2 (two) times daily.  06/21/18  Yes Mikey Kirschner, MD  escitalopram (LEXAPRO) 20 MG tablet TAKE 1 TABLET(20 MG) BY MOUTH DAILY Patient taking differently: Take 20 mg by mouth daily.  06/07/18  Yes Mikey Kirschner, MD  furosemide (LASIX) 20 MG tablet Take 1 tablet (20 mg total) by mouth daily. 08/17/17 07/04/18 Yes Kilroy, Doreene Burke, PA-C  HYDROcodone-acetaminophen (NORCO/VICODIN) 5-325 MG tablet Take one bid prn pain Patient taking differently: Take 0.5 tablets by mouth 2 (two) times daily as needed for moderate pain.  06/21/18  Yes Mikey Kirschner, MD  lisinopril (PRINIVIL,ZESTRIL) 5 MG tablet TAKE 1 TABLET BY MOUTH EVERY DAY Patient taking differently: Take 5 mg by mouth daily.  05/07/18  Yes Lelon Perla, MD  meclizine (ANTIVERT) 25 MG tablet Take 1 tablet (25  mg total) by mouth 3 (three) times daily as needed for dizziness. 10/18/16  Yes Daleen Bo, MD  metFORMIN (GLUCOPHAGE) 500 MG tablet Take 1 tablet (500 mg total) by mouth 2 (two) times daily with a meal. 06/11/17  Yes End, Harrell Gave, MD  metoprolol tartrate (LOPRESSOR) 50 MG tablet TAKE 1 AND 1/2 TABLETS BY MOUTH TWICE DAILY Patient taking differently: Take 75 mg by mouth 2 (two) times daily.  04/06/18  Yes Branch,  Alphonse Guild, MD  Na Sulfate-K Sulfate-Mg Sulf 17.5-3.13-1.6 GM/177ML SOLN Take 1 kit by mouth as directed. 05/21/18  Yes Rettie Laird, Marga Melnick, MD  Omega-3 Fatty Acids (FISH OIL PO) Take 1 capsule by mouth daily.    Yes [provider]  omeprazole (PRILOSEC) 40 MG capsule Take 1 capsule (40 mg total) by mouth daily. 06/21/18  Yes Mikey Kirschner, MD  ondansetron (ZOFRAN ODT) 4 MG disintegrating tablet Take one every 8 hours as needed for nausea Patient taking differently: Take 4 mg by mouth every 8 (eight) hours as needed for nausea or vomiting.  04/16/18  Yes Mikey Kirschner, MD  potassium chloride SA (K-DUR,KLOR-CON) 20 MEQ tablet Take 1 tablet (20 mEq total) by mouth daily. 06/12/17  Yes Larene Pickett, PA-C  rOPINIRole (REQUIP) 3 MG tablet Take 1 tablet (3 mg total) by mouth at bedtime. 06/21/18  Yes Mikey Kirschner, MD  sildenafil (VIAGRA) 100 MG tablet TAKE 1 TABLET BY MOUTH EVERY DAY AS NEEDED FOR ERECTILE DYSFUNCTION 07/13/18  Yes Crenshaw, Denice Bors, MD  sodium chloride (OCEAN) 0.65 % SOLN nasal spray Place 1 spray into both nostrils daily as needed for congestion.    Yes [provider]  sucralfate (CARAFATE) 1 g tablet TAKE 1 TABLET BY MOUTH THREE TIMES DAILY BEFORE MEALS Patient taking differently: Take 1 g by mouth 3 (three) times daily before meals.  06/22/18  Yes Mikey Kirschner, MD  tetrahydrozoline (VISINE) 0.05 % ophthalmic solution Place 2 drops into both eyes daily as needed (for dry eyes).   Yes [provider]  traZODone (DESYREL) 50 MG tablet TAKE 2 TABLETS(100 MG) BY MOUTH AT BEDTIME Patient taking differently: Take 100 mg by mouth at bedtime.  05/08/18  Yes Mikey Kirschner, MD  triamcinolone cream (KENALOG) 0.1 % Apply 1 application topically 2 (two) times daily. Patient taking differently: Apply 1 application topically daily as needed (for irritation).  06/30/14  Yes Mikey Kirschner, MD  VENTOLIN HFA 108 (90 Base) MCG/ACT inhaler INHALE 2 PUFFS  INTO THE LUNGS EVERY 6 HOURS AS NEEDED FOR WHEEZING OR SHORTNESS OF BREATH Patient taking differently: Inhale 2 puffs into the lungs every 6 (six) hours as needed for wheezing or shortness of breath.  01/26/18  Yes Mikey Kirschner, MD  nitroGLYCERIN (NITROSTAT) 0.4 MG SL tablet Place 1 tablet (0.4 mg total) under the tongue every 5 (five) minutes as needed. Patient taking differently: Place 0.4 mg under the tongue every 5 (five) minutes as needed for chest pain.  06/30/15   Lendon Colonel, NP  ranitidine (ZANTAC) 300 MG tablet Take 1 tablet (300 mg total) by mouth daily. Patient not taking: Reported on 07/04/2018 04/17/18   Mikey Kirschner, MD    Allergies as of 05/21/2018  . (No Known Allergies)    Family History  Problem Relation Age of Onset  . Hypertension Mother   . Breast cancer Mother 61  . Heart attack Father   . Diabetes Brother   . Skin cancer Sister 43  .  Brain cancer Maternal Uncle   . Cancer Maternal Uncle        NOS  . Breast cancer Cousin        maternal cousin dx <50  . Colon cancer Neg Hx     Social History   Socioeconomic History  . Marital status: Married    Spouse name: Not on file  . Number of children: 5  . Years of education: Not on file  . Highest education level: Not on file  Occupational History  . Occupation: employed    Fish farm manager: [employer]    Comment: full-time  Social Needs  . Financial resource strain: Not on file  . Food insecurity:    Worry: Not on file    Inability: Not on file  . Transportation needs:    Medical: Not on file    Non-medical: Not on file  Tobacco Use  . Smoking status: Current Every Day Smoker    Packs/day: 0.50    Years: 32.00    Pack years: 16.00    Types: Cigarettes    Start date: 07/24/1970  . Smokeless tobacco: Never Used  Substance and Sexual Activity  . Alcohol use: No    Alcohol/week: 0.0 standard drinks  . Drug use: No  . Sexual activity: Yes    Partners: Female  Lifestyle  . Physical activity:     Days per week: Not on file    Minutes per session: Not on file  . Stress: Not on file  Relationships  . Social connections:    Talks on phone: Not on file    Gets together: Not on file    Attends religious service: Not on file    Active member of club or organization: Not on file    Attends meetings of clubs or organizations: Not on file    Relationship status: Not on file  . Intimate partner violence:    Fear of current or ex partner: Not on file    Emotionally abused: Not on file    Physically abused: Not on file    Forced sexual activity: Not on file  Other Topics Concern  . Not on file  Social History Narrative  . Not on file    Review of Systems: See HPI, otherwise negative ROS   Physical Exam: BP 133/83   Pulse 77   Temp 98.3 F (36.8 C) (Oral)   Resp (!) 21   Ht 5' 8"  (1.727 m)   Wt 109.3 kg   SpO2 98%   BMI 36.64 kg/m  General:   Alert,  pleasant and cooperative in NAD Head:  Normocephalic and atraumatic. Neck:  Supple; Lungs:  Clear throughout to auscultation.    Heart:  Regular rate and rhythm. Abdomen:  Soft, nontender and nondistended. Normal bowel sounds, without guarding, and without rebound.   Neurologic:  Alert and  oriented x4;  grossly normal neurologically.  Impression/Plan:     DYSPHAGIA/dyspepsia/IRON DEFICIENCY ANEMIA.   PLAN:  EGD/DIL/tcs TODAY. DISCUSSED PROCEDURE, BENEFITS, & RISKS: < 1% chance of medication reaction, bleeding, perforation, or rupture of spleen/liver.

## 2018-07-17 NOTE — Op Note (Addendum)
[facility] Patient Name: Shaun [name] Procedure Date: 07/17/2018 11:23 AM MRN: [id number] Date of Birth: [birth year] Attending MD: Barney Drain MD, MD CSN: [id number] Age: 62 Admit Type: Outpatient Procedure:                Colonoscopy with COLD FORCEPS/SNARE & SNARE                            POLYPECTOMY/INJECTION/CLIP x1 Indications:              Personal history of colonic polyps Providers:                Barney Drain MD, MD, Otis Peak B. Sharon Seller, RN,                            Randa Spike, Technician Referring MD:             Rosemary Holms, MD Medicines:                Propofol per Anesthesia Complications:            No immediate complications. Estimated Blood Loss:     Estimated blood loss was minimal. Procedure:                Pre-Anesthesia Assessment:                           - Prior to the procedure, a History and Physical                            was performed, and patient medications and                            allergies were reviewed. The patient's tolerance of                            previous anesthesia was also reviewed. The risks                            and benefits of the procedure and the sedation                            options and risks were discussed with the patient.                            All questions were answered, and informed consent                            was obtained. Prior Anticoagulants: The patient has                            taken Eliquis (apixaban), last dose was 2 days                            prior to procedure. ASA Grade Assessment: II - A  patient with mild systemic disease. After reviewing                            the risks and benefits, the patient was deemed in                            satisfactory condition to undergo the procedure.                            After obtaining informed consent, the colonoscope                            was passed under direct vision.  Throughout the                            procedure, the patient's blood pressure, pulse, and                            oxygen saturations were monitored continuously. The                            [id number]-[id number] ([id number] scope was introduced through                            the anus and advanced to the the cecum, identified                            by appendiceal orifice and ileocecal valve. The                            colonoscopy was somewhat difficult due to a                            tortuous colon. Successful completion of the                            procedure was aided by straightening and shortening                            the scope to obtain bowel loop reduction and                            COLOWRAP. The patient tolerated the procedure well.                            The quality of the bowel preparation was good. The                            ileocecal valve, appendiceal orifice, and rectum                            were photographed. Scope In: 11:58:41 AM Scope Out: 12:47:24 PM Total Procedure Duration: 0 hours 48 minutes 43 seconds  Findings:      Two sessile polyps were found in the transverse colon and ascending       colon. The polyps were 2 to 3 mm in size. These polyps were removed with       a cold biopsy forceps. Resection and retrieval were complete.      A 10 mm polyp was found in the ascending colon. The polyp was       carpet-like and sessile. Area was successfully injected with 2 mL of a       1:10,000 solution of epinephrine for drug delivery. The polyp was       removed with a hot snare. Resection and retrieval were complete. To       prevent bleeding after the polypectomy, one hemostatic clip was       successfully placed (MR conditional). There was no bleeding at the end       of the procedure.      Multiple sessile polyps(29) were found in the sigmoid colon(1_,       transverse colon(1) and ascending colon. The polyps were 3 to 8 mm in        size. These polyps were removed with a cold snare. Resection and       retrieval were complete.      Multiple small and large-mouthed diverticula were found in the entire       colon.      External and internal hemorrhoids were found. The hemorrhoids were       moderate. Impression:               - 32 COLON POLYPS Resected and retrieved.                           - MODERATE Diverticulosis in the entire examined                            colon.                           - External and internal hemorrhoids. Moderate Sedation:      Per Anesthesia Care Recommendation:           - Patient has a contact number available for                            emergencies. The signs and symptoms of potential                            delayed complications were discussed with the                            patient. Return to normal activities tomorrow.                            Written discharge instructions were provided to the                            patient.                           - High fiber  diet and low fat diet.                           - Continue present medications. RESTART ELIQUIS NOV                            20.                           - Await pathology results.                           - Repeat colonoscopy 1-3 YEARS for surveillance.                            .ALL FIRST DEGREE RELATIVES NEED TCS AT AGE 60.                           - Return to my office in 4 months. Procedure Code(s):        --- Professional ---                           [id number], Colonoscopy, flexible; with removal of                            tumor(s), polyp(s), or other lesion(s) by snare                            technique                           45380, 59, Colonoscopy, flexible; with biopsy,                            single or multiple                           45381, Colonoscopy, flexible; with directed                            submucosal injection(s), any substance Diagnosis Code(s):        ---  Professional ---                           D12.5, Benign neoplasm of sigmoid colon                           D12.3, Benign neoplasm of transverse colon (hepatic                            flexure or splenic flexure)                           D12.2, Benign neoplasm of ascending colon                           K64.8, Other hemorrhoids  Z86.010, Personal history of colonic polyps                           K57.30, Diverticulosis of large intestine without                            perforation or abscess without bleeding CPT copyright 2018 American Medical Association. All rights reserved. The codes documented in this report are preliminary and upon coder review may  be revised to meet current compliance requirements. Barney Drain, MD Barney Drain MD, MD 07/17/2018 1:26:56 PM This report has been signed electronically. Number of Addenda: 0

## 2018-07-17 NOTE — Discharge Instructions (Signed)
You had 32 polyps removed. You have moderate internal hemorrhoids and diverticulosis IN YOUR RIGHT AND LEFT COLON. I dilated your esophagus DUE TO A STRICTURE. You have MILD gastritis. I biopsied your stomach.   HOLD ELIQUIS. RE-START NOV 20.  DRINK WATER TO KEEP YOUR URINE LIGHT YELLOW.  CONTINUE YOUR WEIGHT LOSS EFFORTS. YOUR BODY MASS INDEX IS OVER 30 WHICH MEANS YOU ARE OBESE. OBESITY IS ASSOCIATED WITH AN INCREASED FOR CIRRHOSIS AND ALL CANCERS, INCLUDING ESOPHAGEAL AND COLON CANCER. A WEIGHT OF 195 LBS OR LESS  WILL GET YOUR BODY MASS INDEX(BMI) UNDER 30.  FOLLOW A HIGH FIBER/LOW FAT DIET. AVOID ITEMS THAT CAUSE BLOATING. SEE INFO BELOW.  AVOID REFLUX TRIGGERS. SEE INFO BELOW.   TAKE IRON TWICE DAILY FOR 3 MOS.  CONTINUE OMEPRAZOLE. TAKE 30 MINUTES PRIOR TO A MEAL ONCE DAILY.   YOUR BIOPSY RESULTS WILL BE BACK IN 5 BUSINESS DAYS.  FOLLOW UP IN 3 MOS.  Next colonoscopy in 1-3 years. YOUR SISTERS, BROTHERS, CHILDREN, AND PARENTS NEED TO HAVE A COLONOSCOPY STARTING AT THE AGE OF 40.   ENDOSCOPY Care After Read the instructions outlined below and refer to this sheet in the next week. These discharge instructions provide you with general information on caring for yourself after you leave the hospital. While your treatment has been planned according to the most current medical practices available, unavoidable complications occasionally occur. If you have any problems or questions after discharge, call DR. Henrietta Cieslewicz, [phone].  ACTIVITY  You may resume your regular activity, but move at a slower pace for the next 24 hours.   Take frequent rest periods for the next 24 hours.   Walking will help get rid of the air and reduce the bloated feeling in your belly (abdomen).   No driving for 24 hours (because of the medicine (anesthesia) used during the test).   You may shower.   Do not sign any important legal documents or operate any machinery for 24 hours (because of the  anesthesia used during the test).    NUTRITION  Drink plenty of fluids.   You may resume your normal diet as instructed by your doctor.   Begin with a light meal and progress to your normal diet. Heavy or fried foods are harder to digest and may make you feel sick to your stomach (nauseated).   Avoid alcoholic beverages for 24 hours or as instructed.    MEDICATIONS  You may resume your normal medications.   WHAT YOU CAN EXPECT TODAY  Some feelings of bloating in the abdomen.   Passage of more gas than usual.   Spotting of blood in your stool or on the toilet paper  .  IF YOU HAD POLYPS REMOVED DURING THE ENDOSCOPY:  Eat a soft diet IF YOU HAVE NAUSEA, BLOATING, ABDOMINAL PAIN, OR VOMITING.    FINDING OUT THE RESULTS OF YOUR TEST Not all test results are available during your visit. DR. Oneida Alar WILL CALL YOU WITHIN 14 DAYS OF YOUR PROCEDUE WITH YOUR RESULTS. Do not assume everything is normal if you have not heard from DR. Darrel Baroni, CALL HER OFFICE AT [phone].  SEEK IMMEDIATE MEDICAL ATTENTION AND CALL THE OFFICE: [phone] IF:  You have more than a spotting of blood in your stool.   Your belly is swollen (abdominal distention).   You are nauseated or vomiting.   You have a temperature over 101F.   You have abdominal pain or discomfort that is severe or gets worse throughout the day.  Lifestyle  and home remedies TO MANAGE REFLUX/CHEST PAIN  You may eliminate or reduce the frequency of heartburn by making the following lifestyle changes:   Control your weight. Being overweight is a major risk factor for heartburn and GERD. Excess pounds put pressure on your abdomen, pushing up your stomach and causing acid to back up into your esophagus.    Eat smaller meals. 4 TO 6 MEALS A DAY. This reduces pressure on the lower esophageal sphincter, helping to prevent the valve from opening and acid from washing back into your esophagus.    Loosen your belt. Clothes  that fit tightly around your waist put pressure on your abdomen and the lower esophageal sphincter.    Eliminate heartburn triggers. Everyone has specific triggers. Common triggers such as fatty or fried foods, spicy food, tomato sauce, carbonated beverages, alcohol, chocolate, mint, garlic, onion, caffeine and nicotine may make heartburn worse.    Avoid stooping or bending. Tying your shoes is OK. Bending over for longer periods to weed your garden isn't, especially soon after eating.    Don't lie down after a meal. Wait at least three to four hours after eating before going to bed, and don't lie down right after eating.    PUT THE HEAD OF YOUR BED ON 6 INCH BLOCKS.   Alternative medicine  Several home remedies exist for treating GERD, but they provide only temporary relief. They include drinking baking soda (sodium bicarbonate) added to water or drinking other fluids such as baking soda mixed with cream of tartar and water.   Although these liquids create temporary relief by neutralizing, washing away or buffering acids, eventually they aggravate the situation by adding gas and fluid to your stomach, increasing pressure and causing more acid reflux. Further, adding more sodium to your diet may increase your blood pressure and add stress to your heart, and excessive bicarbonate ingestion can alter the acid-base balance in your body.   Low-Fat Diet BREADS, CEREALS, PASTA, RICE, DRIED PEAS, AND BEANS These products are high in carbohydrates and most are low in fat. Therefore, they can be increased in the diet as substitutes for fatty foods. They too, however, contain calories and should not be eaten in excess. Cereals can be eaten for snacks as well as for breakfast.   FRUITS AND VEGETABLES It is good to eat fruits and vegetables. Besides being sources of fiber, both are rich in vitamins and some minerals. They help you get the daily allowances of these nutrients. Fruits and vegetables  can be used for snacks and desserts.  MEATS Limit lean meat, chicken, [place], and fish to no more than 6 ounces per day. Beef, Pork, and Lamb Use lean cuts of beef, pork, and lamb. Lean cuts include:  Extra-lean ground beef.  Arm roast.  Sirloin tip.  Center-cut ham.  Round steak.  Loin chops.  Rump roast.  Tenderloin.  Trim all fat off the outside of meats before cooking. It is not necessary to severely decrease the intake of red meat, but lean choices should be made. Lean meat is rich in protein and contains a highly absorbable form of iron. Premenopausal women, in particular, should avoid reducing lean red meat because this could increase the risk for low red blood cells (iron-deficiency anemia).  Chicken and [place] These are good sources of protein. The fat of poultry can be reduced by removing the skin and underlying fat layers before cooking. Chicken and [place] can be substituted for lean red meat in the diet. Poultry  should not be fried or covered with high-fat sauces. Fish and Shellfish Fish is a good source of protein. Shellfish contain cholesterol, but they usually are low in saturated fatty acids. The preparation of fish is important. Like chicken and [place], they should not be fried or covered with high-fat sauces. EGGS Egg whites contain no fat or cholesterol. They can be eaten often. Try 1 to 2 egg whites instead of whole eggs in recipes or use egg substitutes that do not contain yolk. MILK AND DAIRY PRODUCTS Use skim or 1% milk instead of 2% or whole milk. Decrease whole milk, natural, and processed cheeses. Use nonfat or low-fat (2%) cottage cheese or low-fat cheeses made from vegetable oils. Choose nonfat or low-fat (1 to 2%) yogurt. Experiment with evaporated skim milk in recipes that call for heavy cream. Substitute low-fat yogurt or low-fat cottage cheese for sour cream in dips and salad dressings. Have at least 2 servings of low-fat dairy products, such as 2 glasses of  skim (or 1%) milk each day to help get your daily calcium intake. FATS AND OILS Reduce the total intake of fats, especially saturated fat. Butterfat, lard, and beef fats are high in saturated fat and cholesterol. These should be avoided as much as possible. Vegetable fats do not contain cholesterol, but certain vegetable fats, such as coconut oil, palm oil, and palm kernel oil are very high in saturated fats. These should be limited. These fats are often used in bakery goods, processed foods, popcorn, oils, and nondairy creamers. Vegetable shortenings and some peanut butters contain hydrogenated oils, which are also saturated fats. Read the labels on these foods and check for saturated vegetable oils. Unsaturated vegetable oils and fats do not raise blood cholesterol. However, they should be limited because they are fats and are high in calories. Total fat should still be limited to 30% of your daily caloric intake. Desirable liquid vegetable oils are corn oil, cottonseed oil, olive oil, canola oil, safflower oil, soybean oil, and sunflower oil. Peanut oil is not as good, but small amounts are acceptable. Buy a heart-healthy tub margarine that has no partially hydrogenated oils in the ingredients. Mayonnaise and salad dressings often are made from unsaturated fats, but they should also be limited because of their high calorie and fat content. Seeds, nuts, peanut butter, olives, and avocados are high in fat, but the fat is mainly the unsaturated type. These foods should be limited mainly to avoid excess calories and fat. OTHER EATING TIPS Snacks  Most sweets should be limited as snacks. They tend to be rich in calories and fats, and their caloric content outweighs their nutritional value. Some good choices in snacks are graham crackers, melba toast, soda crackers, bagels (no egg), English muffins, fruits, and vegetables. These snacks are preferable to snack crackers, [place] fries, TORTILLA CHIPS, and POTATO  chips. Popcorn should be air-popped or cooked in small amounts of liquid vegetable oil. Desserts Eat fruit, low-fat yogurt, and fruit ices instead of pastries, cake, and cookies. Sherbet, angel food cake, gelatin dessert, frozen low-fat yogurt, or other frozen products that do not contain saturated fat (pure fruit juice bars, frozen ice pops) are also acceptable.  COOKING METHODS Choose those methods that use little or no fat. They include: Poaching.  Braising.  Steaming.  Grilling.  Baking.  Stir-frying.  Broiling.  Microwaving.  Foods can be cooked in a nonstick pan without added fat, or use a nonfat cooking spray in regular cookware. Limit fried foods and avoid frying  in saturated fat. Add moisture to lean meats by using water, broth, cooking wines, and other nonfat or low-fat sauces along with the cooking methods mentioned above. Soups and stews should be chilled after cooking. The fat that forms on top after a few hours in the refrigerator should be skimmed off. When preparing meals, avoid using excess salt. Salt can contribute to raising blood pressure in some people.  EATING AWAY FROM HOME Order entres, potatoes, and vegetables without sauces or butter. When meat exceeds the size of a deck of cards (3 to 4 ounces), the rest can be taken home for another meal. Choose vegetable or fruit salads and ask for low-calorie salad dressings to be served on the side. Use dressings sparingly. Limit high-fat toppings, such as bacon, crumbled eggs, cheese, sunflower seeds, and olives. Ask for heart-healthy tub margarine instead of butter.   High-Fiber Diet A high-fiber diet changes your normal diet to include more whole grains, legumes, fruits, and vegetables. Changes in the diet involve replacing refined carbohydrates with unrefined foods. The calorie level of the diet is essentially unchanged. The Dietary Reference Intake (recommended amount) for adult males is 38 grams per day. For adult females,  it is 25 grams per day. Pregnant and lactating women should consume 28 grams of fiber per day. Fiber is the intact part of a plant that is not broken down during digestion. Functional fiber is fiber that has been isolated from the plant to provide a beneficial effect in the body.  PURPOSE  Increase stool bulk.   Ease and regulate bowel movements.   Lower cholesterol.  REDUCE RISK OF COLON CANCER  INDICATIONS THAT YOU NEED MORE FIBER  Constipation and hemorrhoids.   Uncomplicated diverticulosis (intestine condition) and irritable bowel syndrome.   Weight management.   As a protective measure against hardening of the arteries (atherosclerosis), diabetes, and cancer.   GUIDELINES FOR INCREASING FIBER IN THE DIET  Start adding fiber to the diet slowly. A gradual increase of about 5 more grams (2 slices of whole-wheat bread, 2 servings of most fruits or vegetables, or 1 bowl of high-fiber cereal) per day is best. Too rapid an increase in fiber may result in constipation, flatulence, and bloating.   Drink enough water and fluids to keep your urine clear or pale yellow. Water, juice, or caffeine-free drinks are recommended. Not drinking enough fluid may cause constipation.   Eat a variety of high-fiber foods rather than one type of fiber.   Try to increase your intake of fiber through using high-fiber foods rather than fiber pills or supplements that contain small amounts of fiber.   The goal is to change the types of food eaten. Do not supplement your present diet with high-fiber foods, but replace foods in your present diet.   INCLUDE A VARIETY OF FIBER SOURCES  Replace refined and processed grains with whole grains, canned fruits with fresh fruits, and incorporate other fiber sources. White rice, white breads, and most bakery goods contain little or no fiber.   Brown whole-grain rice, buckwheat oats, and many fruits and vegetables are all good sources of fiber. These include:  broccoli, Brussels sprouts, cabbage, cauliflower, beets, sweet potatoes, white potatoes (skin on), carrots, tomatoes, eggplant, squash, berries, fresh fruits, and dried fruits.   Cereals appear to be the richest source of fiber. Cereal fiber is found in whole grains and bran. Bran is the fiber-rich outer coat of cereal grain, which is largely removed in refining. In whole-grain cereals, the bran remains.  In breakfast cereals, the largest amount of fiber is found in those with "bran" in their names. The fiber content is sometimes indicated on the label.   You may need to include additional fruits and vegetables each day.   In baking, for 1 cup white flour, you may use the following substitutions:   1 cup whole-wheat flour minus 2 tablespoons.   1/2 cup white flour plus 1/2 cup whole-wheat flour.    Polyps, Colon  A polyp is extra tissue that grows inside your body. Colon polyps grow in the large intestine. The large intestine, also called the colon, is part of your digestive system. It is a long, hollow tube at the end of your digestive tract where your body makes and stores stool. Most polyps are not dangerous. They are benign. This means they are not cancerous. But over time, some types of polyps can turn into cancer. Polyps that are smaller than a pea are usually not harmful. But larger polyps could someday become or may already be cancerous. To be safe, doctors remove all polyps and test them.    PREVENTION There is not one sure way to prevent polyps. You might be able to lower your risk of getting them if you:  Eat more fruits and vegetables and less fatty food.   Do not smoke.   Avoid alcohol.   Exercise every day.   Lose weight if you are overweight.   Eating more calcium and folate can also lower your risk of getting polyps. Some foods that are rich in calcium are milk, cheese, and broccoli. Some foods that are rich in folate are chickpeas, kidney beans, and spinach.    Diverticulosis Diverticulosis is a common condition that develops when small pouches (diverticula) form in the wall of the colon. The risk of diverticulosis increases with age. It happens more often in people who eat a low-fiber diet. Most individuals with diverticulosis have no symptoms. Those individuals with symptoms usually experience belly (abdominal) pain, constipation, or loose stools (diarrhea).  HOME CARE INSTRUCTIONS  Increase the amount of fiber in your diet as directed by your caregiver or dietician. This may reduce symptoms of diverticulosis.   Drink at least 6 to 8 glasses of water each day to prevent constipation.   Try not to strain when you have a bowel movement.   Avoiding nuts and seeds to prevent complications is NOT NECESSARY.    SEEK IMMEDIATE MEDICAL CARE IF:  You develop increasing pain or severe bloating.   You have an oral temperature above 101F.   You develop vomiting or bowel movements that are bloody or black.   Gastritis  Gastritis is an inflammation (the body's way of reacting to injury and/or infection) of the stomach. It is often caused by viral or bacterial (germ) infections. It can also be caused BY ASPIRIN, BC/GOODY POWDER'S, (IBUPROFEN) MOTRIN, OR ALEVE (NAPROXEN), chemicals (including alcohol), SPICY FOODS, and medications. This illness may be associated with generalized malaise (feeling tired, not well), UPPER ABDOMINAL STOMACH cramps, and fever. One common bacterial cause of gastritis is an organism known as H. Pylori. This can be treated with antibiotics.

## 2018-07-17 NOTE — Transfer of Care (Signed)
Immediate Anesthesia Transfer of Care Note  Patient: Shaun [name]  Procedure(s) Performed: COLONOSCOPY WITH PROPOFOL (N/A ) ESOPHAGOGASTRODUODENOSCOPY (EGD) WITH PROPOFOL (N/A ) SAVORY DILATION (N/A ) POLYPECTOMY BIOPSY  Patient Location: PACU  Anesthesia Type:General  Level of Consciousness: awake and patient cooperative  Airway & Oxygen Therapy: Patient Spontanous Breathing and Patient connected to nasal cannula oxygen  Post-op Assessment: Report given to RN, Post -op Vital signs reviewed and stable and Patient moving all extremities  Post vital signs: Reviewed and stable  Last Vitals:  Vitals Value Taken Time  BP    Temp    Pulse 52 07/17/2018  1:10 PM  Resp    SpO2 84 % 07/17/2018  1:10 PM  Vitals shown include unvalidated device data.  Last Pain:  Vitals:   07/17/18 1146  TempSrc:   PainSc: 3          Complications: No apparent anesthesia complications

## 2018-07-18 DIAGNOSIS — Z1283 Encounter for screening for malignant neoplasm of skin: Secondary | ICD-10-CM | POA: Diagnosis not present

## 2018-07-18 DIAGNOSIS — X32XXXD Exposure to sunlight, subsequent encounter: Secondary | ICD-10-CM | POA: Diagnosis not present

## 2018-07-18 DIAGNOSIS — D225 Melanocytic nevi of trunk: Secondary | ICD-10-CM | POA: Diagnosis not present

## 2018-07-18 DIAGNOSIS — B078 Other viral warts: Secondary | ICD-10-CM | POA: Diagnosis not present

## 2018-07-18 DIAGNOSIS — L570 Actinic keratosis: Secondary | ICD-10-CM | POA: Diagnosis not present

## 2018-07-19 NOTE — Anesthesia Postprocedure Evaluation (Signed)
Anesthesia Post Note  Patient: Shaun [name]  Procedure(s) Performed: COLONOSCOPY WITH PROPOFOL (N/A ) ESOPHAGOGASTRODUODENOSCOPY (EGD) WITH PROPOFOL (N/A ) SAVORY DILATION (N/A ) POLYPECTOMY BIOPSY  Patient location during evaluation: PACU Anesthesia Type: General Level of consciousness: awake and alert and oriented Pain management: pain level controlled Vital Signs Assessment: post-procedure vital signs reviewed and stable Respiratory status: spontaneous breathing Cardiovascular status: blood pressure returned to baseline and stable Postop Assessment: no apparent nausea or vomiting Anesthetic complications: no     Last Vitals:  Vitals:   07/17/18 1330 07/17/18 1338  BP: (!) 128/95 (!) 144/88  Pulse: 74 85  Resp: 18 18  Temp:  36.9 C  SpO2: 94% 93%    Last Pain:  Vitals:   07/17/18 1338  TempSrc: Oral  PainSc: 0-No pain                 [name]

## 2018-07-19 NOTE — Progress Notes (Signed)
LMOM to call.

## 2018-07-20 ENCOUNTER — Encounter (HOSPITAL_COMMUNITY): Payer: Self-pay | Admitting: Gastroenterology

## 2018-07-23 NOTE — Progress Notes (Signed)
PT is aware.

## 2018-07-24 DIAGNOSIS — M1611 Unilateral primary osteoarthritis, right hip: Secondary | ICD-10-CM | POA: Diagnosis not present

## 2018-08-13 ENCOUNTER — Other Ambulatory Visit: Payer: Self-pay | Admitting: Cardiology

## 2018-08-13 MED ORDER — POTASSIUM CHLORIDE CRYS ER 20 MEQ PO TBCR: 20.0000 meq | EXTENDED_RELEASE_TABLET | Freq: Every day | ORAL | 0 refills | Status: DC

## 2018-08-13 MED ORDER — FUROSEMIDE 20 MG PO TABS: 20.0000 mg | ORAL_TABLET | Freq: Every day | ORAL | 0 refills | Status: DC

## 2018-08-30 ENCOUNTER — Encounter: Payer: Self-pay | Admitting: Family Medicine

## 2018-08-30 ENCOUNTER — Ambulatory Visit (INDEPENDENT_AMBULATORY_CARE_PROVIDER_SITE_OTHER): Payer: 59 | Admitting: Family Medicine

## 2018-08-30 VITALS — BP 118/78 | Temp 98.1°F | Ht 68.0 in | Wt 237.4 lb

## 2018-08-30 DIAGNOSIS — J111 Influenza due to unidentified influenza virus with other respiratory manifestations: Secondary | ICD-10-CM | POA: Diagnosis not present

## 2018-08-30 MED ORDER — HYDROCODONE-HOMATROPINE 5-1.5 MG/5ML PO SYRP: 5.0000 mL | ORAL_SOLUTION | Freq: Four times a day (QID) | ORAL | 0 refills | Status: DC | PRN

## 2018-08-30 MED ORDER — OSELTAMIVIR PHOSPHATE 75 MG PO CAPS: 75.0000 mg | ORAL_CAPSULE | Freq: Two times a day (BID) | ORAL | 0 refills | Status: DC

## 2018-08-30 NOTE — Progress Notes (Signed)
   Subjective:    Patient ID: Shaun [name], male    DOB: [birth year], 63 y.o.   MRN: [id number]  Fever   This is a new problem. The current episode started in the past 7 days. Associated symptoms include abdominal pain, congestion, coughing, headaches, a sore throat and wheezing. Associated symptoms comments: fever. Treatments tried: otc cold meds.   Started tue morn    Had body aches   Fever , cough   Dim enrgy    No energy  No appetite   Cough off and on sore, felt real bad with it      Review of Systems  Constitutional: Positive for fever.  HENT: Positive for congestion and sore throat.   Respiratory: Positive for cough and wheezing.   Gastrointestinal: Positive for abdominal pain.  Neurological: Positive for headaches.       Objective:   Physical Exam  Alert vitals reviewed, moderate malaise. Hydration good. Positive nasal congestion lungs no crackles or wheezes, no tachypnea, intermittent bronchial cough during exam heart regular rate and rhythm.       Assessment & Plan:  Impression influenza discussed at length. Petra Kuba of illness and potential sequela discussed. Plan Tamiflu prescribed if indicated and timing appropriate. Symptom care discussed. Warning signs discussed. WSL Encouraged patient to use albuterol faithfully  Hycodan 1 teaspoon every 6 as needed for cough symptom care discussed

## 2018-08-31 DIAGNOSIS — I4891 Unspecified atrial fibrillation: Secondary | ICD-10-CM | POA: Diagnosis not present

## 2018-08-31 DIAGNOSIS — R0789 Other chest pain: Secondary | ICD-10-CM | POA: Diagnosis not present

## 2018-08-31 DIAGNOSIS — R079 Chest pain, unspecified: Secondary | ICD-10-CM | POA: Diagnosis not present

## 2018-09-03 ENCOUNTER — Encounter: Payer: Self-pay | Admitting: Family Medicine

## 2018-09-03 ENCOUNTER — Telehealth: Payer: Self-pay | Admitting: Family Medicine

## 2018-09-03 MED ORDER — AZITHROMYCIN 250 MG PO TABS: ORAL_TABLET | ORAL | 0 refills | Status: DC

## 2018-09-03 NOTE — Telephone Encounter (Signed)
Prescription sent electronically to pharmacy. Patient notified. 

## 2018-09-03 NOTE — Telephone Encounter (Signed)
Pt was seen and diagnosed with the flu on Jan 2nd. Since then he has been running a fever between 100.4 -101. He just wants to make sure it is normal for him to still have a fever. He is starting to feel better but cant get his fever to break. CB# [phone]

## 2018-09-03 NOTE — Telephone Encounter (Signed)
Could still be the flu, add z pk just in case some secondary bact illness

## 2018-09-09 ENCOUNTER — Other Ambulatory Visit: Payer: Self-pay | Admitting: Cardiology

## 2018-09-17 ENCOUNTER — Encounter: Payer: Self-pay | Admitting: Family Medicine

## 2018-09-17 ENCOUNTER — Emergency Department (HOSPITAL_COMMUNITY): Payer: 59 | Admitting: Anesthesiology

## 2018-09-17 ENCOUNTER — Other Ambulatory Visit: Payer: Self-pay

## 2018-09-17 ENCOUNTER — Encounter (HOSPITAL_COMMUNITY)
Admission: EM | Disposition: A | Discharge status: Home / Self Care | Payer: Self-pay | Source: Home / Self Care | Attending: General Surgery

## 2018-09-17 ENCOUNTER — Encounter (HOSPITAL_COMMUNITY): Payer: Self-pay

## 2018-09-17 ENCOUNTER — Emergency Department (HOSPITAL_COMMUNITY): Payer: 59

## 2018-09-17 ENCOUNTER — Inpatient Hospital Stay (HOSPITAL_COMMUNITY)
Admission: EM | Admit: 2018-09-17 | Discharge: 2018-09-22 | DRG: 853 | Disposition: A | Discharge status: Home / Self Care | Payer: 59 | Attending: General Surgery | Admitting: General Surgery

## 2018-09-17 ENCOUNTER — Ambulatory Visit: Payer: 59 | Admitting: Family Medicine

## 2018-09-17 VITALS — BP 122/70 | Temp 98.2°F | Ht 68.0 in | Wt 231.0 lb

## 2018-09-17 DIAGNOSIS — R100 Acute abdomen: Secondary | ICD-10-CM | POA: Diagnosis not present

## 2018-09-17 DIAGNOSIS — K56609 Unspecified intestinal obstruction, unspecified as to partial versus complete obstruction: Secondary | ICD-10-CM

## 2018-09-17 DIAGNOSIS — G4733 Obstructive sleep apnea (adult) (pediatric): Secondary | ICD-10-CM | POA: Diagnosis present

## 2018-09-17 DIAGNOSIS — Z87891 Personal history of nicotine dependence: Secondary | ICD-10-CM | POA: Diagnosis not present

## 2018-09-17 DIAGNOSIS — G2581 Restless legs syndrome: Secondary | ICD-10-CM | POA: Diagnosis present

## 2018-09-17 DIAGNOSIS — K651 Peritoneal abscess: Secondary | ICD-10-CM | POA: Diagnosis not present

## 2018-09-17 DIAGNOSIS — K631 Perforation of intestine (nontraumatic): Secondary | ICD-10-CM | POA: Diagnosis present

## 2018-09-17 DIAGNOSIS — J45909 Unspecified asthma, uncomplicated: Secondary | ICD-10-CM | POA: Diagnosis not present

## 2018-09-17 DIAGNOSIS — Z8249 Family history of ischemic heart disease and other diseases of the circulatory system: Secondary | ICD-10-CM | POA: Diagnosis not present

## 2018-09-17 DIAGNOSIS — K219 Gastro-esophageal reflux disease without esophagitis: Secondary | ICD-10-CM | POA: Diagnosis present

## 2018-09-17 DIAGNOSIS — A419 Sepsis, unspecified organism: Secondary | ICD-10-CM | POA: Diagnosis not present

## 2018-09-17 DIAGNOSIS — K4090 Unilateral inguinal hernia, without obstruction or gangrene, not specified as recurrent: Secondary | ICD-10-CM | POA: Diagnosis not present

## 2018-09-17 DIAGNOSIS — J449 Chronic obstructive pulmonary disease, unspecified: Secondary | ICD-10-CM | POA: Diagnosis present

## 2018-09-17 DIAGNOSIS — I4891 Unspecified atrial fibrillation: Secondary | ICD-10-CM | POA: Diagnosis not present

## 2018-09-17 DIAGNOSIS — Z7901 Long term (current) use of anticoagulants: Secondary | ICD-10-CM

## 2018-09-17 DIAGNOSIS — Z79899 Other long term (current) drug therapy: Secondary | ICD-10-CM

## 2018-09-17 DIAGNOSIS — R0602 Shortness of breath: Secondary | ICD-10-CM | POA: Diagnosis not present

## 2018-09-17 DIAGNOSIS — E785 Hyperlipidemia, unspecified: Secondary | ICD-10-CM | POA: Diagnosis present

## 2018-09-17 DIAGNOSIS — K529 Noninfective gastroenteritis and colitis, unspecified: Secondary | ICD-10-CM | POA: Diagnosis present

## 2018-09-17 DIAGNOSIS — R188 Other ascites: Secondary | ICD-10-CM | POA: Diagnosis present

## 2018-09-17 DIAGNOSIS — I2510 Atherosclerotic heart disease of native coronary artery without angina pectoris: Secondary | ICD-10-CM | POA: Diagnosis present

## 2018-09-17 DIAGNOSIS — E119 Type 2 diabetes mellitus without complications: Secondary | ICD-10-CM | POA: Diagnosis present

## 2018-09-17 DIAGNOSIS — R0902 Hypoxemia: Secondary | ICD-10-CM

## 2018-09-17 DIAGNOSIS — Z7951 Long term (current) use of inhaled steroids: Secondary | ICD-10-CM

## 2018-09-17 DIAGNOSIS — K5730 Diverticulosis of large intestine without perforation or abscess without bleeding: Secondary | ICD-10-CM | POA: Diagnosis not present

## 2018-09-17 DIAGNOSIS — N471 Phimosis: Secondary | ICD-10-CM | POA: Diagnosis present

## 2018-09-17 DIAGNOSIS — I480 Paroxysmal atrial fibrillation: Secondary | ICD-10-CM | POA: Diagnosis present

## 2018-09-17 DIAGNOSIS — G8929 Other chronic pain: Secondary | ICD-10-CM | POA: Diagnosis present

## 2018-09-17 DIAGNOSIS — I110 Hypertensive heart disease with heart failure: Secondary | ICD-10-CM | POA: Diagnosis present

## 2018-09-17 DIAGNOSIS — K659 Peritonitis, unspecified: Secondary | ICD-10-CM | POA: Diagnosis not present

## 2018-09-17 DIAGNOSIS — I509 Heart failure, unspecified: Secondary | ICD-10-CM | POA: Diagnosis present

## 2018-09-17 DIAGNOSIS — R109 Unspecified abdominal pain: Secondary | ICD-10-CM | POA: Diagnosis not present

## 2018-09-17 DIAGNOSIS — I482 Chronic atrial fibrillation, unspecified: Secondary | ICD-10-CM | POA: Diagnosis present

## 2018-09-17 DIAGNOSIS — Z7984 Long term (current) use of oral hypoglycemic drugs: Secondary | ICD-10-CM

## 2018-09-17 DIAGNOSIS — Z833 Family history of diabetes mellitus: Secondary | ICD-10-CM | POA: Diagnosis not present

## 2018-09-17 DIAGNOSIS — G4730 Sleep apnea, unspecified: Secondary | ICD-10-CM | POA: Diagnosis not present

## 2018-09-17 DIAGNOSIS — K55029 Acute infarction of small intestine, extent unspecified: Secondary | ICD-10-CM | POA: Diagnosis not present

## 2018-09-17 HISTORY — DX: Unspecified osteoarthritis, unspecified site: M19.90

## 2018-09-17 HISTORY — PX: LAPAROTOMY: SHX154

## 2018-09-17 HISTORY — DX: Diverticulitis of intestine, part unspecified, without perforation or abscess without bleeding: K57.92

## 2018-09-17 HISTORY — PX: BOWEL RESECTION: SHX1257

## 2018-09-17 LAB — URINALYSIS, ROUTINE W REFLEX MICROSCOPIC
Bilirubin Urine: NEGATIVE
Glucose, UA: NEGATIVE mg/dL
HGB URINE DIPSTICK: NEGATIVE
KETONES UR: NEGATIVE mg/dL
Leukocytes, UA: NEGATIVE
Nitrite: NEGATIVE
PH: 7 (ref 5.0–8.0)
PROTEIN: NEGATIVE mg/dL
Specific Gravity, Urine: 1.008 (ref 1.005–1.030)

## 2018-09-17 LAB — COMPREHENSIVE METABOLIC PANEL
ALT: 28 U/L (ref 0–44)
AST: 19 U/L (ref 15–41)
Albumin: 3.2 g/dL — ABNORMAL LOW (ref 3.5–5.0)
Alkaline Phosphatase: 95 U/L (ref 38–126)
Anion gap: 11 (ref 5–15)
BUN: 10 mg/dL (ref 8–23)
CO2: 23 mmol/L (ref 22–32)
CREATININE: 0.91 mg/dL (ref 0.61–1.24)
Calcium: 8.6 mg/dL — ABNORMAL LOW (ref 8.9–10.3)
Chloride: 101 mmol/L (ref 98–111)
GFR calc non Af Amer: >60 mL/min (ref >60)
GLUCOSE: 132 mg/dL — AB (ref 70–99)
Potassium: 3.7 mmol/L (ref 3.5–5.1)
SODIUM: 135 mmol/L (ref 135–145)
Total Bilirubin: 1.3 mg/dL — ABNORMAL HIGH (ref 0.3–1.2)
Total Protein: 6.9 g/dL (ref 6.5–8.1)

## 2018-09-17 LAB — CBC
HEMATOCRIT: 34 % — AB (ref 39.0–52.0)
Hemoglobin: 10.4 g/dL — ABNORMAL LOW (ref 13.0–17.0)
MCH: 22.5 pg — AB (ref 26.0–34.0)
MCHC: 30.6 g/dL (ref 30.0–36.0)
MCV: 73.4 fL — ABNORMAL LOW (ref 80.0–100.0)
Platelets: 278 10*3/uL (ref 150–400)
RBC: 4.63 MIL/uL (ref 4.22–5.81)
RDW: 19 % — AB (ref 11.5–15.5)
WBC: 13.1 10*3/uL — ABNORMAL HIGH (ref 4.0–10.5)
nRBC: 0 % (ref 0.0–0.2)

## 2018-09-17 LAB — LIPASE, BLOOD: LIPASE: 17 U/L (ref 11–51)

## 2018-09-17 LAB — ABO/RH: ABO/RH(D): B POS

## 2018-09-17 LAB — LACTIC ACID, PLASMA
Lactic Acid, Venous: 1.9 mmol/L (ref 0.5–1.9)
Lactic Acid, Venous: 1.6 mmol/L (ref 0.5–1.9)

## 2018-09-17 LAB — GLUCOSE, CAPILLARY: Glucose-Capillary: 129 mg/dL — ABNORMAL HIGH (ref 70–99)

## 2018-09-17 LAB — PREPARE RBC (CROSSMATCH)

## 2018-09-17 SURGERY — LAPAROTOMY, EXPLORATORY
Anesthesia: General | Site: Abdomen

## 2018-09-17 MED ORDER — DEXMEDETOMIDINE HCL IN NACL 400 MCG/100ML IV SOLN
INTRAVENOUS | Status: DC | PRN
  Administered 2018-09-17: 20 ug/kg/h via INTRAVENOUS

## 2018-09-17 MED ORDER — METOPROLOL TARTRATE 5 MG/5ML IV SOLN
INTRAVENOUS | Status: AC
  Filled 2018-09-17: qty 5

## 2018-09-17 MED ORDER — FENTANYL CITRATE (PF) 100 MCG/2ML IJ SOLN
50.0000 ug | INTRAMUSCULAR | Status: DC | PRN
  Administered 2018-09-17 (×2): 50 ug via INTRAVENOUS
  Filled 2018-09-17 (×2): qty 2

## 2018-09-17 MED ORDER — SUCCINYLCHOLINE CHLORIDE 200 MG/10ML IV SOSY
PREFILLED_SYRINGE | INTRAVENOUS | Status: AC
  Filled 2018-09-17: qty 10

## 2018-09-17 MED ORDER — FENTANYL CITRATE (PF) 250 MCG/5ML IJ SOLN
INTRAMUSCULAR | Status: AC
  Filled 2018-09-17: qty 5

## 2018-09-17 MED ORDER — PANTOPRAZOLE SODIUM 40 MG IV SOLR
40.0000 mg | Freq: Every day | INTRAVENOUS | Status: DC
  Administered 2018-09-17 – 2018-09-21 (×5): 40 mg via INTRAVENOUS
  Filled 2018-09-17 (×5): qty 40

## 2018-09-17 MED ORDER — IPRATROPIUM-ALBUTEROL 0.5-2.5 (3) MG/3ML IN SOLN
3.0000 mL | Freq: Three times a day (TID) | RESPIRATORY_TRACT | Status: DC
  Administered 2018-09-18 – 2018-09-22 (×13): 3 mL via RESPIRATORY_TRACT
  Filled 2018-09-17 (×13): qty 3

## 2018-09-17 MED ORDER — SODIUM CHLORIDE 0.9 % IV BOLUS (SEPSIS)
1000.0000 mL | Freq: Once | INTRAVENOUS | Status: AC
  Administered 2018-09-17: 1000 mL via INTRAVENOUS

## 2018-09-17 MED ORDER — SODIUM CHLORIDE 0.9 % IV SOLN
2.0000 g | Freq: Two times a day (BID) | INTRAVENOUS | Status: AC
  Administered 2018-09-18 – 2018-09-19 (×3): 2 g via INTRAVENOUS
  Filled 2018-09-17 (×3): qty 2

## 2018-09-17 MED ORDER — ONDANSETRON HCL 4 MG/2ML IJ SOLN: 4.0000 mg | Freq: Once | INTRAMUSCULAR | Status: DC | PRN

## 2018-09-17 MED ORDER — LACTATED RINGERS IV SOLN
INTRAVENOUS | Status: DC
  Administered 2018-09-17: 17:00:00 via INTRAVENOUS

## 2018-09-17 MED ORDER — GLYCOPYRROLATE PF 0.2 MG/ML IJ SOSY
PREFILLED_SYRINGE | INTRAMUSCULAR | Status: AC
  Filled 2018-09-17: qty 1

## 2018-09-17 MED ORDER — KETOROLAC TROMETHAMINE 30 MG/ML IJ SOLN: 30.0000 mg | Freq: Once | INTRAMUSCULAR | Status: DC | PRN

## 2018-09-17 MED ORDER — BUPIVACAINE LIPOSOME 1.3 % IJ SUSP
INTRAMUSCULAR | Status: AC
  Filled 2018-09-17: qty 20

## 2018-09-17 MED ORDER — SUCCINYLCHOLINE CHLORIDE 20 MG/ML IJ SOLN
INTRAMUSCULAR | Status: DC | PRN
  Administered 2018-09-17: 100 mg via INTRAVENOUS

## 2018-09-17 MED ORDER — MORPHINE SULFATE (PF) 2 MG/ML IV SOLN
2.0000 mg | INTRAVENOUS | Status: DC | PRN
  Administered 2018-09-17 – 2018-09-20 (×17): 2 mg via INTRAVENOUS
  Filled 2018-09-17 (×17): qty 1

## 2018-09-17 MED ORDER — DIPHENHYDRAMINE HCL 12.5 MG/5ML PO ELIX: 12.5000 mg | ORAL_SOLUTION | Freq: Four times a day (QID) | ORAL | Status: DC | PRN

## 2018-09-17 MED ORDER — CHLORHEXIDINE GLUCONATE CLOTH 2 % EX PADS: 6.0000 | MEDICATED_PAD | Freq: Once | CUTANEOUS | Status: DC

## 2018-09-17 MED ORDER — ONDANSETRON HCL 4 MG/2ML IJ SOLN: 4.0000 mg | Freq: Four times a day (QID) | INTRAMUSCULAR | Status: DC | PRN

## 2018-09-17 MED ORDER — DEXAMETHASONE SODIUM PHOSPHATE 4 MG/ML IJ SOLN
INTRAMUSCULAR | Status: DC | PRN
  Administered 2018-09-17: 8 mg via INTRAVENOUS

## 2018-09-17 MED ORDER — METOPROLOL TARTRATE 5 MG/5ML IV SOLN
INTRAVENOUS | Status: DC | PRN
  Administered 2018-09-17: 5 mg via INTRAVENOUS

## 2018-09-17 MED ORDER — DEXAMETHASONE SODIUM PHOSPHATE 10 MG/ML IJ SOLN
INTRAMUSCULAR | Status: AC
  Filled 2018-09-17: qty 1

## 2018-09-17 MED ORDER — ROCURONIUM BROMIDE 100 MG/10ML IV SOLN
INTRAVENOUS | Status: DC | PRN
  Administered 2018-09-17 (×2): 20 mg via INTRAVENOUS

## 2018-09-17 MED ORDER — MEPERIDINE HCL 50 MG/ML IJ SOLN: 6.2500 mg | INTRAMUSCULAR | Status: DC | PRN

## 2018-09-17 MED ORDER — NITROGLYCERIN 0.4 MG SL SUBL: 0.4000 mg | SUBLINGUAL_TABLET | SUBLINGUAL | Status: DC | PRN

## 2018-09-17 MED ORDER — METRONIDAZOLE IN NACL 5-0.79 MG/ML-% IV SOLN
500.0000 mg | Freq: Three times a day (TID) | INTRAVENOUS | Status: DC
  Administered 2018-09-17: 500 mg via INTRAVENOUS
  Filled 2018-09-17: qty 100

## 2018-09-17 MED ORDER — NEOSTIGMINE METHYLSULFATE 3 MG/3ML IV SOSY
PREFILLED_SYRINGE | INTRAVENOUS | Status: AC
  Filled 2018-09-17: qty 3

## 2018-09-17 MED ORDER — LIDOCAINE 2% (20 MG/ML) 5 ML SYRINGE
INTRAMUSCULAR | Status: AC
  Filled 2018-09-17: qty 5

## 2018-09-17 MED ORDER — PROPOFOL 10 MG/ML IV BOLUS
INTRAVENOUS | Status: DC | PRN
  Administered 2018-09-17: 150 mg via INTRAVENOUS
  Administered 2018-09-17: 50 mg via INTRAVENOUS

## 2018-09-17 MED ORDER — HYDROMORPHONE HCL 1 MG/ML IJ SOLN: 0.2500 mg | INTRAMUSCULAR | Status: DC | PRN

## 2018-09-17 MED ORDER — ESMOLOL HCL 100 MG/10ML IV SOLN
INTRAVENOUS | Status: AC
  Filled 2018-09-17: qty 10

## 2018-09-17 MED ORDER — ONDANSETRON 4 MG PO TBDP: 4.0000 mg | ORAL_TABLET | Freq: Four times a day (QID) | ORAL | Status: DC | PRN

## 2018-09-17 MED ORDER — SODIUM CHLORIDE 0.9 % IV SOLN
Freq: Once | INTRAVENOUS | Status: AC
  Administered 2018-09-17: 16:00:00 via INTRAVENOUS

## 2018-09-17 MED ORDER — KETOROLAC TROMETHAMINE 30 MG/ML IJ SOLN
INTRAMUSCULAR | Status: AC
  Filled 2018-09-17: qty 1

## 2018-09-17 MED ORDER — KETOROLAC TROMETHAMINE 30 MG/ML IJ SOLN
INTRAMUSCULAR | Status: DC | PRN
  Administered 2018-09-17: 30 mg via INTRAVENOUS

## 2018-09-17 MED ORDER — SODIUM CHLORIDE 0.9 % IV BOLUS (SEPSIS)
500.0000 mL | Freq: Once | INTRAVENOUS | Status: AC
  Administered 2018-09-17: 500 mL via INTRAVENOUS

## 2018-09-17 MED ORDER — METOPROLOL TARTRATE 5 MG/5ML IV SOLN
5.0000 mg | Freq: Four times a day (QID) | INTRAVENOUS | Status: DC
  Administered 2018-09-17 – 2018-09-20 (×13): 5 mg via INTRAVENOUS
  Filled 2018-09-17 (×13): qty 5

## 2018-09-17 MED ORDER — SODIUM CHLORIDE 0.9 % IV BOLUS: 1000.0000 mL | Freq: Once | INTRAVENOUS | Status: DC

## 2018-09-17 MED ORDER — BUPIVACAINE LIPOSOME 1.3 % IJ SUSP
INTRAMUSCULAR | Status: DC | PRN
  Administered 2018-09-17: 20 mL

## 2018-09-17 MED ORDER — FENTANYL CITRATE (PF) 100 MCG/2ML IJ SOLN
INTRAMUSCULAR | Status: DC | PRN
  Administered 2018-09-17: 150 ug via INTRAVENOUS
  Administered 2018-09-17 (×2): 50 ug via INTRAVENOUS

## 2018-09-17 MED ORDER — ESMOLOL HCL 100 MG/10ML IV SOLN
INTRAVENOUS | Status: DC | PRN
  Administered 2018-09-17: 50 mg via INTRAVENOUS

## 2018-09-17 MED ORDER — PHENYLEPHRINE 40 MCG/ML (10ML) SYRINGE FOR IV PUSH (FOR BLOOD PRESSURE SUPPORT)
PREFILLED_SYRINGE | INTRAVENOUS | Status: AC
  Filled 2018-09-17: qty 50

## 2018-09-17 MED ORDER — LIDOCAINE HCL (CARDIAC) PF 100 MG/5ML IV SOSY
PREFILLED_SYRINGE | INTRAVENOUS | Status: DC | PRN
  Administered 2018-09-17: 50 mg via INTRAVENOUS

## 2018-09-17 MED ORDER — DIPHENHYDRAMINE HCL 50 MG/ML IJ SOLN: 12.5000 mg | Freq: Four times a day (QID) | INTRAMUSCULAR | Status: DC | PRN

## 2018-09-17 MED ORDER — SODIUM CHLORIDE 0.9 % IV SOLN
2.0000 g | Freq: Three times a day (TID) | INTRAVENOUS | Status: DC
  Administered 2018-09-17 – 2018-09-18 (×3): 2 g via INTRAVENOUS
  Filled 2018-09-17 (×9): qty 2

## 2018-09-17 MED ORDER — ALBUTEROL SULFATE (2.5 MG/3ML) 0.083% IN NEBU: 2.5000 mg | INHALATION_SOLUTION | RESPIRATORY_TRACT | Status: DC | PRN

## 2018-09-17 MED ORDER — IOPAMIDOL (ISOVUE-300) INJECTION 61%
100.0000 mL | Freq: Once | INTRAVENOUS | Status: AC | PRN
  Administered 2018-09-17: 100 mL via INTRAVENOUS

## 2018-09-17 MED ORDER — HYDROCODONE-ACETAMINOPHEN 7.5-325 MG PO TABS: 1.0000 | ORAL_TABLET | Freq: Once | ORAL | Status: DC | PRN

## 2018-09-17 MED ORDER — IPRATROPIUM-ALBUTEROL 0.5-2.5 (3) MG/3ML IN SOLN
RESPIRATORY_TRACT | Status: AC
  Filled 2018-09-17: qty 3

## 2018-09-17 MED ORDER — PHENYLEPHRINE HCL 10 MG/ML IJ SOLN
INTRAMUSCULAR | Status: DC | PRN
  Administered 2018-09-17 (×3): 100 ug via INTRAVENOUS

## 2018-09-17 MED ORDER — NEOSTIGMINE METHYLSULFATE 10 MG/10ML IV SOLN
INTRAVENOUS | Status: DC | PRN
  Administered 2018-09-17: 3 mg via INTRAVENOUS

## 2018-09-17 MED ORDER — SODIUM CHLORIDE 0.9 % IR SOLN
Status: DC | PRN
  Administered 2018-09-17 (×2): 1000 mL

## 2018-09-17 MED ORDER — GLYCOPYRROLATE 0.2 MG/ML IJ SOLN
INTRAMUSCULAR | Status: DC | PRN
  Administered 2018-09-17: 0.2 mg via INTRAVENOUS

## 2018-09-17 MED ORDER — ONDANSETRON HCL 4 MG/2ML IJ SOLN
INTRAMUSCULAR | Status: AC
  Filled 2018-09-17: qty 2

## 2018-09-17 MED ORDER — SODIUM CHLORIDE 0.9 % IV SOLN
2.0000 g | INTRAVENOUS | Status: AC
  Administered 2018-09-17: 2 g via INTRAVENOUS
  Filled 2018-09-17: qty 2

## 2018-09-17 MED ORDER — IPRATROPIUM-ALBUTEROL 0.5-2.5 (3) MG/3ML IN SOLN
3.0000 mL | RESPIRATORY_TRACT | Status: DC
  Administered 2018-09-17: 3 mL via RESPIRATORY_TRACT

## 2018-09-17 MED ORDER — LACTATED RINGERS IV SOLN
INTRAVENOUS | Status: DC
  Administered 2018-09-17 – 2018-09-19 (×4): via INTRAVENOUS

## 2018-09-17 MED ORDER — MORPHINE SULFATE (PF) 4 MG/ML IV SOLN
4.0000 mg | Freq: Once | INTRAVENOUS | Status: AC
  Administered 2018-09-17: 4 mg via INTRAVENOUS
  Filled 2018-09-17: qty 1

## 2018-09-17 MED ORDER — PROPOFOL 10 MG/ML IV BOLUS
INTRAVENOUS | Status: AC
  Filled 2018-09-17: qty 20

## 2018-09-17 MED ORDER — ONDANSETRON HCL 4 MG/2ML IJ SOLN
INTRAMUSCULAR | Status: DC | PRN
  Administered 2018-09-17: 4 mg via INTRAVENOUS

## 2018-09-17 MED ORDER — ROCURONIUM BROMIDE 10 MG/ML (PF) SYRINGE
PREFILLED_SYRINGE | INTRAVENOUS | Status: AC
  Filled 2018-09-17: qty 10

## 2018-09-17 MED ORDER — ONDANSETRON HCL 4 MG/2ML IJ SOLN
4.0000 mg | Freq: Once | INTRAMUSCULAR | Status: AC
  Administered 2018-09-17: 4 mg via INTRAVENOUS
  Filled 2018-09-17: qty 2

## 2018-09-17 MED ORDER — METOPROLOL TARTRATE 5 MG/5ML IV SOLN
5.0000 mg | Freq: Four times a day (QID) | INTRAVENOUS | Status: DC | PRN
  Filled 2018-09-17: qty 5

## 2018-09-17 SURGICAL SUPPLY — 79 items
APPLIER CLIP 11 MED OPEN (CLIP)
APPLIER CLIP 13 LRG OPEN (CLIP)
APR CLP MED 11 20 MLT OPN (CLIP)
BARRIER SKIN 2 3/4 (OSTOMY) IMPLANT
BARRIER SKIN OD2.25 2 3/4 FLNG (OSTOMY) IMPLANT
BRR SKN FLT 2.75X2.25 2 PC (OSTOMY)
CELLS DAT CNTRL 66122 CELL SVR (MISCELLANEOUS) IMPLANT
CHLORAPREP W/TINT 26ML (MISCELLANEOUS) ×3 IMPLANT
CLAMP POUCH DRAINAGE QUIET (OSTOMY) IMPLANT
CLIP APPLIE 11 MED OPEN (CLIP) IMPLANT
CLIP APPLIE 13 LRG OPEN (CLIP) IMPLANT
CLOTH BEACON ORANGE TIMEOUT ST (SAFETY) ×3 IMPLANT
COVER LIGHT HANDLE STERIS (MISCELLANEOUS) ×6 IMPLANT
COVER WAND RF STERILE (DRAPES) ×1 IMPLANT
DRAPE WARM FLUID 44X44 (DRAPE) ×3 IMPLANT
DRSG OPSITE POSTOP 4X10 (GAUZE/BANDAGES/DRESSINGS) ×3 IMPLANT
ELECT BLADE 6 FLAT ULTRCLN (ELECTRODE) IMPLANT
ELECT REM PT RETURN 9FT ADLT (ELECTROSURGICAL) ×3
ELECTRODE REM PT RTRN 9FT ADLT (ELECTROSURGICAL) ×2 IMPLANT
GAUZE SPONGE 4X4 12PLY STRL (GAUZE/BANDAGES/DRESSINGS) ×3 IMPLANT
GLOVE BIO SURGEON STRL SZ 6.5 (GLOVE) ×4 IMPLANT
GLOVE BIO SURGEON STRL SZ7 (GLOVE) ×1 IMPLANT
GLOVE BIOGEL M 7.0 STRL (GLOVE) ×1 IMPLANT
GLOVE BIOGEL PI IND STRL 6.5 (GLOVE) ×2 IMPLANT
GLOVE BIOGEL PI IND STRL 7.0 (GLOVE) ×4 IMPLANT
GLOVE BIOGEL PI IND STRL 7.5 (GLOVE) IMPLANT
GLOVE BIOGEL PI INDICATOR 6.5 (GLOVE) ×2
GLOVE BIOGEL PI INDICATOR 7.0 (GLOVE) ×3
GLOVE BIOGEL PI INDICATOR 7.5 (GLOVE) ×1
GLOVE SURG SS PI 6.5 STRL IVOR (GLOVE) ×1 IMPLANT
GLOVE SURG SS PI 7.5 STRL IVOR (GLOVE) ×3 IMPLANT
GOWN STRL REUS W/TWL LRG LVL3 (GOWN DISPOSABLE) ×9 IMPLANT
HANDLE SUCTION POOLE (INSTRUMENTS) ×2 IMPLANT
INST SET MAJOR GENERAL (KITS) ×3 IMPLANT
KIT REMOVER STAPLE SKIN (MISCELLANEOUS) IMPLANT
KIT TURNOVER KIT A (KITS) ×3 IMPLANT
LIGASURE IMPACT 36 18CM CVD LR (INSTRUMENTS) ×3 IMPLANT
MANIFOLD NEPTUNE II (INSTRUMENTS) ×3 IMPLANT
NDL HYPO 18GX1.5 BLUNT FILL (NEEDLE) ×2 IMPLANT
NDL HYPO 21X1.5 SAFETY (NEEDLE) ×2 IMPLANT
NEEDLE HYPO 18GX1.5 BLUNT FILL (NEEDLE) ×3 IMPLANT
NEEDLE HYPO 21X1.5 SAFETY (NEEDLE) ×3 IMPLANT
NS IRRIG 1000ML POUR BTL (IV SOLUTION) ×7 IMPLANT
PACK ABDOMINAL MAJOR (CUSTOM PROCEDURE TRAY) ×3 IMPLANT
PAD ABD 5X9 TENDERSORB (GAUZE/BANDAGES/DRESSINGS) ×1 IMPLANT
PAD ARMBOARD 7.5X6 YLW CONV (MISCELLANEOUS) ×3 IMPLANT
POUCH OSTOMY 2 3/4  H 3804 (WOUND CARE)
POUCH OSTOMY 2 3/4 H 3804 (WOUND CARE)
POUCH OSTOMY 2 PC DRNBL 2.75 (WOUND CARE) IMPLANT
RELOAD LINEAR CUT PROX 55 BLUE (ENDOMECHANICALS) IMPLANT
RELOAD PROXIMATE 75MM BLUE (ENDOMECHANICALS) ×9 IMPLANT
RELOAD STAPLE 55 3.8 BLU REG (ENDOMECHANICALS) IMPLANT
RELOAD STAPLE 75 3.8 BLU REG (ENDOMECHANICALS) IMPLANT
RETRACTOR WND ALEXIS 18 MED (MISCELLANEOUS) ×2 IMPLANT
RETRACTOR WND ALEXIS 25 LRG (MISCELLANEOUS) IMPLANT
RETRACTOR WND ALEXIS-O 25 LRG (MISCELLANEOUS) IMPLANT
RTRCTR WOUND ALEXIS 18CM MED (MISCELLANEOUS)
RTRCTR WOUND ALEXIS 25CM LRG (MISCELLANEOUS)
RTRCTR WOUND ALEXIS O 25CM LRG (MISCELLANEOUS) ×3
SET BASIN LINEN APH (SET/KITS/TRAYS/PACK) ×3 IMPLANT
SPONGE LAP 18X18 RF (DISPOSABLE) ×3 IMPLANT
STAPLER GUN LINEAR PROX 60 (STAPLE) ×1 IMPLANT
STAPLER PROXIMATE 55 BLUE (STAPLE) IMPLANT
STAPLER PROXIMATE 75MM BLUE (STAPLE) ×1 IMPLANT
STAPLER VISISTAT (STAPLE) ×3 IMPLANT
SUCTION POOLE HANDLE (INSTRUMENTS) ×3
SUT CHROMIC 0 SH (SUTURE) IMPLANT
SUT CHROMIC 2 0 SH (SUTURE) IMPLANT
SUT CHROMIC 3 0 SH 27 (SUTURE) ×3 IMPLANT
SUT PDS AB CT VIOLET #0 27IN (SUTURE) ×6 IMPLANT
SUT PROLENE 2 0 SH 30 (SUTURE) ×3 IMPLANT
SUT SILK 2 0 (SUTURE) ×3
SUT SILK 2-0 18XBRD TIE 12 (SUTURE) ×2 IMPLANT
SUT SILK 3 0 SH CR/8 (SUTURE) ×1 IMPLANT
SUT VIC AB 3-0 SH 27 (SUTURE) ×6
SUT VIC AB 3-0 SH 27X BRD (SUTURE) IMPLANT
SYR 20CC LL (SYRINGE) ×3 IMPLANT
TRAY FOLEY W/BAG SLVR 16FR (SET/KITS/TRAYS/PACK) ×3
TRAY FOLEY W/BAG SLVR 16FR ST (SET/KITS/TRAYS/PACK) ×2 IMPLANT

## 2018-09-17 NOTE — Anesthesia Preprocedure Evaluation (Addendum)
Anesthesia Evaluation  Patient identified by MRN, date of birth, ID band Patient awake    Reviewed: Allergy & Precautions, NPO status , Patient's Chart, lab work & pertinent test results  Airway Mallampati: III     Mouth opening: Limited Mouth Opening  Dental  (+) Poor Dentition, Missing   Pulmonary asthma , sleep apnea and Continuous Positive Airway Pressure Ventilation , COPD,  COPD inhaler, Current Smoker, former smoker,  C/w smoking  Smoked earlier  today  Rarely uses inhalers -states last ~4 months ago    Pulmonary exam normal        Cardiovascular Exercise Tolerance: Poor hypertension, Pt. on medications + angina with exertion + CAD and +CHF  + dysrhythmias Atrial Fibrillation II Rhythm:Irregular  Limited ET States never cardioverted  Latest EF 10/18 ~ 60% States last stent 2016 States last SL NTG over a year ago    Neuro/Psych PSYCHIATRIC DISORDERS Depression negative neurological ROS     GI/Hepatic Neg liver ROS, PUD, GERD  Medicated and Controlled,  Endo/Other  negative endocrine ROSdiabetes, Type 2  Renal/GU negative Renal ROS  negative genitourinary   Musculoskeletal negative musculoskeletal ROS (+)   Abdominal (+) + obese,   Peds negative pediatric ROS (+)  Hematology negative hematology ROS (+)   Anesthesia Other Findings   Reproductive/Obstetrics negative OB ROS                            Anesthesia Physical  Anesthesia Plan  ASA: IV and emergent  Anesthesia Plan: General   Post-op Pain Management:    Induction: Intravenous  PONV Risk Score and Plan:   Airway Management Planned: Oral ETT  Additional Equipment:   Intra-op Plan:   Post-operative Plan:   Informed Consent: I have reviewed the patients History and Physical, chart, labs and discussed the procedure including the risks, benefits and alternatives for the proposed anesthesia with the patient or  authorized representative who has indicated his/her understanding and acceptance.     Dental advisory given  Plan Discussed with: CRNA  Anesthesia Plan Comments:         Anesthesia Quick Evaluation

## 2018-09-17 NOTE — Progress Notes (Signed)
   Subjective:    Patient ID: Shaun [name], male    DOB: [birth year], 63 y.o.   MRN: [id number]  HPI Patient is here today with complaints of what he think is a torn hernia.He has some mid and lower abd pain. He states he thinks he tore in on Friday as this was when the pain started.He states he was in horrible pain yesterday after picking up a large object.   He states he is nauseated and that started yesterday,he has not appetite and has not ate since Saturday.  He states he did not go to the ed as he was hard headed.  He states on a scale of 1-10 pain scale he says it will be a 5 and jump to 8. He has been using Hydrocodone 5-325 one po bid prn pain.   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   [employer] ic occur in the [place] of hernia   Has felt nause no vom, appetite off    Last nigth pain was rough, felt horrible, felt soe relief with propping up,  Using hydrocod  Worked full day yest er Review of Systems No headache, no major weight loss or weight gain, no chest pain no back pain a no change in bowel habits complete ROS otherwise negative     Objective:   Physical Exam  Alert and oriented, vitals reviewed and stable, patient in clear distress hunched over table ENT-TM's and ext canals WNL bilat via otoscopic exam Soft palate, tonsils and post pharynx WNL via oropharyngeal exam Neck-symmetric, no masses; thyroid nonpalpable and nontender Pulmonary-no tachypnea or accessory muscle use; Clear without wheezes via auscultation Card--no abnrml murmurs, rhythm reg and rate WNL Carotid pulses symmetric, without bruits Abdomen bowel sounds present diffuse substantial tenderness all quadrants.  Increased in the lower abdomen  Groin area no palpable incarcerated hernia, though region is somewhat compromised by obesity.   Historical addendum positive history of diverticulosis      Assessment & Plan:  #1 #1 acute abdomen by both exam and  description.  Very concerning.  Discussed with patient.  Discussed with family.  I called the ER physician.  Will get a get patient over for any emergent work-up.  Rationale discussed with family.  Greater than 50% of this 25 minute face to face visit was spent in counseling and discussion and coordination of care regarding the above diagnosis/diagnosies

## 2018-09-17 NOTE — ED Notes (Signed)
Patient transported to CT 

## 2018-09-17 NOTE — Progress Notes (Signed)
Pharmacy Antibiotic Note  Shaun [name] is a 63 y.o. male admitted on 09/17/2018 with intra-abdominal infection.  Pharmacy has been consulted for Cefepime dosing.  Plan: Cefepime 2000 mg IV every 8 hours.  Flagyl 500 mg IV every 8 hours. Monitor labs, c/s, and patient improvement.  Height: 5\' 8"  (172.7 cm) Weight: 230 lb (104.3 kg) IBW/kg (Calculated) : 68.4  Temp (24hrs), Avg:98.9 F (37.2 C), Min:98.2 F (36.8 C), Max:99.6 F (37.6 C)  Recent Labs  Lab 09/17/18 1229  WBC 13.1*  CREATININE 0.91    Estimated Creatinine Clearance: 98.6 mL/min (by C-G formula based on SCr of 0.91 mg/dL).    No Known Allergies  Antimicrobials this admission: Cefepime 1/20 >>  Flagyl 1/20 >>   Dose adjustments this admission: N/A  Microbiology results: 1/20 BCx: pending 1/20 UCx: pending    Thank you for allowing pharmacy to be a part of this patient's care.  Ramond Craver 09/17/2018 1:30 PM

## 2018-09-17 NOTE — Transfer of Care (Signed)
Immediate Anesthesia Transfer of Care Note  Patient: Shaun [name]  Procedure(s) Performed: EXPLORATORY LAPAROTOMY (N/A ) SMALL BOWEL RESECTION (Abdomen)  Patient Location: PACU  Anesthesia Type:General  Level of Consciousness: awake, alert , oriented and drowsy  Airway & Oxygen Therapy: Patient Spontanous Breathing and Patient connected to nasal cannula oxygen  Post-op Assessment: Report given to RN and Post -op Vital signs reviewed and stable  Post vital signs: Reviewed  Last Vitals:  Vitals Value Taken Time  BP 109/70 09/17/2018  7:15 PM  Temp    Pulse 85 09/17/2018  7:16 PM  Resp 19 09/17/2018  7:16 PM  SpO2 91 % 09/17/2018  7:16 PM  Vitals shown include unvalidated device data.  Last Pain:  Vitals:   09/17/18 1216  TempSrc: Oral  PainSc:          Complications: No apparent anesthesia complications

## 2018-09-17 NOTE — H&P (Addendum)
[facility] History and Physical  Reason for Referral: Perforated bowel  Referring Physician:  Dr. Eulis Foster   Chief Complaint    Abdominal Pain      Shaun [name] is a 63 y.o. male.  HPI: Shaun [name] is a 63 yo who has been having some abdominal pain and melena since Friday but the pain worsened and he went to his PCP, Dr. Wolfgang Phoenix. The pain is throughout his abdomen and his sharp and severe in nature. He was thought to possibly have diverticulitis and was sent to the ED for evaluation. He has had a poor appetite and weakness but no reported nausea/vomiting.   He reports regular BMs and he had a colonoscopy recently with findings of diverticulosis. He takes Eliquis for A fib and has not had a MI but had a stent placed in 2016. He denies any recent Chest pain or SOB.   Three weeks ago he and his wife had the flu and completed tamiflu.  Past Medical History:  Diagnosis Date  . Arteriosclerotic cardiovascular disease (ASCVD)    Nonobstructive; cath in 2000: 30-40% mid LAD and proximal RCA;normal EF. stress nuclear in 2006-subtle inferoseptal hypoperfusion with reversibility; negative stress EKG; good exercise tolerance  . Asthma   . CHF (congestive heart failure) ([place])   . Colon polyps    30 colon polyps found on first colonoscopy  . Diabetes mellitus without complication ([place])   . Diverticulitis   . DJD (degenerative joint disease)   . GERD (gastroesophageal reflux disease)   . History of kidney stones   . Hyperlipidemia   . Hypertension   . Insomnia   . Obstructive sleep apnea 12/2009   01/26/2010 AHI 83/hr  . Paroxysmal atrial fibrillation ([place]) 10/2004   Onset in 10/2004; recurred 09/2008  . PUD (peptic ulcer disease)    1980s  . RLS (restless legs syndrome)   . Sinusitis     Past Surgical History:  Procedure Laterality Date  . BIOPSY  07/17/2018   Procedure: BIOPSY;  Surgeon: Danie Binder, MD;  [facility];  Service: Endoscopy;;  colon  .  CARDIAC CATHETERIZATION N/A 07/21/2015   Procedure: Left Heart Cath and Coronary Angiography;  Surgeon: Peter M Martinique, MD;  Location: [place] [facility];  Service: Cardiovascular;  Laterality: N/A;  . CARDIAC CATHETERIZATION N/A 07/21/2015   Procedure: Coronary Stent Intervention;  Surgeon: Peter M Martinique, MD;  Location: [place] [facility];  Service: Cardiovascular;  Laterality: N/A;  . COLONOSCOPY N/A 05/19/2014   Dr. Barnie Alderman diverticulosis/moderate external hemorrhoids, >20 simple adenomas. Genetic screening negative.   . COLONOSCOPY WITH PROPOFOL N/A 07/17/2018   Procedure: COLONOSCOPY WITH PROPOFOL;  Surgeon: Danie Binder, MD;  [facility];  Service: Endoscopy;  Laterality: N/A;  12:15pm  . ESOPHAGOGASTRODUODENOSCOPY (EGD) WITH PROPOFOL N/A 07/17/2018   Procedure: ESOPHAGOGASTRODUODENOSCOPY (EGD) WITH PROPOFOL;  Surgeon: Danie Binder, MD;  [facility];  Service: Endoscopy;  Laterality: N/A;  . HERNIA REPAIR  1986   Left inguinal  . INTRAVASCULAR PRESSURE WIRE/FFR STUDY Left 06/08/2017   Procedure: INTRAVASCULAR PRESSURE WIRE/FFR STUDY;  Surgeon: Nelva Bush, MD;  Location: [place] [facility];  Service: Cardiovascular;  Laterality: Left;  LAD and CFX  . LEFT HEART CATH AND CORONARY ANGIOGRAPHY N/A 06/08/2017   Procedure: LEFT HEART CATH AND CORONARY ANGIOGRAPHY;  Surgeon: Nelva Bush, MD;  Location: [place] [facility];  Service: Cardiovascular;  Laterality: N/A;  . POLYPECTOMY  07/17/2018   Procedure: POLYPECTOMY;  Surgeon: Danie Binder, MD;  [facility];  Service: Endoscopy;;  colon  . ROTATOR CUFF REPAIR     Right  . SAVORY DILATION N/A 07/17/2018   Procedure: SAVORY DILATION;  Surgeon: Danie Binder, MD;  [facility];  Service: Endoscopy;  Laterality: N/A;    Family History  Problem Relation Age of Onset  . Hypertension Mother   . Breast cancer Mother 32  . Heart attack Father   . Diabetes Brother   . Skin  cancer Sister 3  . Brain cancer Maternal Uncle   . Cancer Maternal Uncle        NOS  . Breast cancer Cousin        maternal cousin dx <50  . Colon cancer Neg Hx     Social History   Tobacco Use  . Smoking status: Former Smoker    Packs/day: 0.50    Years: 32.00    Pack years: 16.00    Types: Cigarettes    Start date: 07/24/1970    Last attempt to quit: 08/29/2018    Years since quitting: 0.0  . Smokeless tobacco: Never Used  Substance Use Topics  . Alcohol use: No    Alcohol/week: 0.0 standard drinks  . Drug use: No    Medications: I have reviewed the patient's current medications. Current Facility-Administered Medications  Medication Dose Route Frequency Provider Last Rate Last Dose  . ceFEPIme (MAXIPIME) 2 g in sodium chloride 0.9 % 100 mL IVPB  2 g Intravenous Q8H Daleen Bo, MD   Stopped at 09/17/18 1416  . [START ON 09/18/2018] cefoTEtan (CEFOTAN) 2 g in sodium chloride 0.9 % 100 mL IVPB  2 g Intravenous On Call to [place], MD      . Chlorhexidine Gluconate Cloth 2 % PADS 6 each  6 each Topical Once Virl Cagey, MD      . fentaNYL (SUBLIMAZE) injection 50 mcg  50 mcg Intravenous Q20 Min PRN Daleen Bo, MD   50 mcg at 09/17/18 1458  . metroNIDAZOLE (FLAGYL) IVPB 500 mg  500 mg Intravenous Q8H Daleen Bo, MD   Stopped at 09/17/18 1523   Current Outpatient Medications  Medication Sig Dispense Refill Last Dose  . apixaban (ELIQUIS) 5 MG TABS tablet Take 1 tablet (5 mg total) by mouth 2 (two) times daily. 60 tablet 6 09/16/2018 at 1930  . atorvastatin (LIPITOR) 80 MG tablet Take 1 tablet (80 mg total) by mouth daily. 90 tablet 1 09/16/2018 at Unknown time  . buPROPion (WELLBUTRIN SR) 150 MG 12 hr tablet Take one each morning for 3 days then take one twice daily (Patient taking differently: Take 150 mg by mouth 2 (two) times daily. ) 60 tablet 5 09/16/2018 at Unknown time  . escitalopram (LEXAPRO) 20 MG tablet TAKE 1 TABLET(20 MG) BY MOUTH DAILY  (Patient taking differently: Take 20 mg by mouth daily. ) 30 tablet 5 09/16/2018 at Unknown time  . furosemide (LASIX) 20 MG tablet TAKE 1 TABLET BY MOUTH EVERY DAY 90 tablet 1 09/16/2018 at Unknown time  . HYDROcodone-acetaminophen (NORCO/VICODIN) 5-325 MG tablet Take one bid prn pain (Patient taking differently: Take 0.5 tablets by mouth 2 (two) times daily as needed for moderate pain. ) 60 tablet 0 09/17/2018 at Unknown time  . iron polysaccharides (NU-IRON) 150 MG capsule 1 PO BID FOR 3 MOS 90 capsule 0 09/16/2018 at Unknown time  . lisinopril (PRINIVIL,ZESTRIL) 5 MG tablet TAKE 1 TABLET BY MOUTH EVERY  DAY (Patient taking differently: Take 5 mg by mouth daily. ) 90 tablet 1 09/16/2018 at Unknown time  . meclizine (ANTIVERT) 25 MG tablet Take 1 tablet (25 mg total) by mouth 3 (three) times daily as needed for dizziness. 30 tablet 0 Taking  . metFORMIN (GLUCOPHAGE) 500 MG tablet Take 1 tablet (500 mg total) by mouth 2 (two) times daily with a meal. 360 tablet 0 09/16/2018 at Unknown time  . metoprolol tartrate (LOPRESSOR) 50 MG tablet TAKE 1 AND 1/2 TABLETS BY MOUTH TWICE DAILY (Patient taking differently: Take 75 mg by mouth 2 (two) times daily. ) 270 tablet 3 09/16/2018 at 2130  . potassium chloride SA (K-DUR,KLOR-CON) 20 MEQ tablet TAKE 1 TABLET BY MOUTH EVERY DAY 90 tablet 1 09/16/2018 at Unknown time  . ranitidine (ZANTAC) 300 MG tablet Take 1 tablet (300 mg total) by mouth daily. WHEN NEEDED TO CONTROL HEARTBURN. 30 tablet 5 09/16/2018 at Unknown time  . rOPINIRole (REQUIP) 3 MG tablet Take 1 tablet (3 mg total) by mouth at bedtime. 90 tablet 1 09/16/2018 at Unknown time  . sildenafil (VIAGRA) 100 MG tablet TAKE 1 TABLET BY MOUTH EVERY DAY AS NEEDED FOR ERECTILE DYSFUNCTION 10 tablet 0 Taking  . sodium chloride (OCEAN) 0.65 % SOLN nasal spray Place 1 spray into both nostrils daily as needed for congestion.    09/16/2018 at Unknown time  . tetrahydrozoline (VISINE) 0.05 % ophthalmic solution Place 2 drops  into both eyes daily as needed (for dry eyes).   09/16/2018 at Unknown time  . traZODone (DESYREL) 50 MG tablet TAKE 2 TABLETS(100 MG) BY MOUTH AT BEDTIME (Patient taking differently: Take 100 mg by mouth at bedtime. ) 60 tablet 5 09/16/2018 at Unknown time  . triamcinolone cream (KENALOG) 0.1 % Apply 1 application topically 2 (two) times daily. (Patient taking differently: Apply 1 application topically daily as needed (for irritation). ) 60 g 1 Taking  . VENTOLIN HFA 108 (90 Base) MCG/ACT inhaler INHALE 2 PUFFS INTO THE LUNGS EVERY 6 HOURS AS NEEDED FOR WHEEZING OR SHORTNESS OF BREATH (Patient taking differently: Inhale 2 puffs into the lungs every 6 (six) hours as needed for wheezing or shortness of breath. ) 18 g 0 Past Week at Unknown time  . azithromycin (ZITHROMAX Z-PAK) 250 MG tablet Take 2 tablets (500 mg) on  Day 1,  followed by 1 tablet (250 mg) once daily on Days 2 through 5. (Patient not taking: Reported on 09/17/2018) 6 each 0 Completed Course at Unknown time  . HYDROcodone-homatropine (HYCODAN) 5-1.5 MG/5ML syrup Take 5 mLs by mouth every 6 (six) hours as needed for cough. (Patient not taking: Reported on 09/17/2018) 90 mL 0 Completed Course at Unknown time  . Na Sulfate-K Sulfate-Mg Sulf 17.5-3.13-1.6 GM/177ML SOLN Take 1 kit by mouth as directed. (Patient not taking: Reported on 09/17/2018) 1 Bottle 0 Completed Course at Unknown time  . nitroGLYCERIN (NITROSTAT) 0.4 MG SL tablet Place 1 tablet (0.4 mg total) under the tongue every 5 (five) minutes as needed. (Patient taking differently: Place 0.4 mg under the tongue every 5 (five) minutes as needed for chest pain. ) 25 tablet 3 Taking  . omeprazole (PRILOSEC) 40 MG capsule Take 1 capsule (40 mg total) by mouth daily. (Patient not taking: Reported on 09/17/2018) 30 capsule 5 Not Taking at Unknown time  . ondansetron (ZOFRAN ODT) 4 MG disintegrating tablet Take one every 8 hours as needed for nausea (Patient taking differently: Take 4 mg by mouth  every 8 (eight) hours  as needed for nausea or vomiting. ) 20 tablet 0 Taking  . oseltamivir (TAMIFLU) 75 MG capsule Take 1 capsule (75 mg total) by mouth 2 (two) times daily. (Patient not taking: Reported on 09/17/2018) 10 capsule 0 Completed Course at Unknown time    No Known Allergies    ROS:  A comprehensive review of systems was negative except for: Constitutional: positive for anorexia and weight loss Gastrointestinal: positive for abdominal pain and melena  Blood pressure (!) 144/83, pulse (!) 129, temperature 99.6 F (37.6 C), temperature source Oral, resp. rate 20, height 5' 8"  (1.727 m), weight 104.3 kg, SpO2 96 %. Physical Exam Vitals signs reviewed.  HENT:     Head: Normocephalic.  Eyes:     Extraocular Movements: Extraocular movements intact.  Cardiovascular:     Rate and Rhythm: Tachycardia present. Rhythm irregular.  Pulmonary:     Effort: Pulmonary effort is normal.  Abdominal:     General: There is distension.     Palpations: Abdomen is soft.     Tenderness: There is generalized abdominal tenderness. There is guarding.  Musculoskeletal: Normal range of motion.     Right lower leg: No edema.     Left lower leg: No edema.  Skin:    General: Skin is warm and dry.  Neurological:     General: No focal deficit present.     Mental Status: He is alert and oriented to person, place, and time.  Psychiatric:        Mood and Affect: Mood normal.        Behavior: Behavior normal.     Results: Results for orders placed or performed during the hospital encounter of 09/17/18 (from the past 48 hour(s))  Lipase, blood     Status: None   Collection Time: 09/17/18 12:29 PM  Result Value Ref Range   Lipase 17 11 - 51 U/L    Comment: Performed at [facility], [address], [place], [place] [address]  Comprehensive metabolic panel     Status: Abnormal   Collection Time: 09/17/18 12:29 PM  Result Value Ref Range   Sodium 135 135 - 145 mmol/L   Potassium 3.7 3.5 - 5.1  mmol/L   Chloride 101 98 - 111 mmol/L   CO2 23 22 - 32 mmol/L   Glucose, Bld 132 (H) 70 - 99 mg/dL   BUN 10 8 - 23 mg/dL   Creatinine, Ser 0.91 0.61 - 1.24 mg/dL   Calcium 8.6 (L) 8.9 - 10.3 mg/dL   Total Protein 6.9 6.5 - 8.1 g/dL   Albumin 3.2 (L) 3.5 - 5.0 g/dL   AST 19 15 - 41 U/L   ALT 28 0 - 44 U/L   Alkaline Phosphatase 95 38 - 126 U/L   Total Bilirubin 1.3 (H) 0.3 - 1.2 mg/dL   GFR calc non Af Amer >60 >60 mL/min   GFR calc Af Amer >60 >60 mL/min   Anion gap 11 5 - 15    Comment: Performed at [facility], [address], [place], [place] [address]  CBC     Status: Abnormal   Collection Time: 09/17/18 12:29 PM  Result Value Ref Range   WBC 13.1 (H) 4.0 - 10.5 K/uL   RBC 4.63 4.22 - 5.81 MIL/uL   Hemoglobin 10.4 (L) 13.0 - 17.0 g/dL   HCT 34.0 (L) 39.0 - 52.0 %   MCV 73.4 (L) 80.0 - 100.0 fL   MCH 22.5 (L) 26.0 - 34.0 pg  MCHC 30.6 30.0 - 36.0 g/dL   RDW 19.0 (H) 11.5 - 15.5 %   Platelets 278 150 - 400 K/uL   nRBC 0.0 0.0 - 0.2 %    Comment: Performed at [facility], [address], [place], [place] [address]  Blood Culture (routine x 2)     Status: None (Preliminary result)   Collection Time: 09/17/18  1:01 PM  Result Value Ref Range   Specimen Description LEFT ANTECUBITAL    Special Requests      BOTTLES DRAWN AEROBIC AND ANAEROBIC Blood Culture adequate volume Performed at [facility], [address], [place], [place] [address]    Culture PENDING    Report Status PENDING   Lactic acid, plasma     Status: None   Collection Time: 09/17/18  1:01 PM  Result Value Ref Range   Lactic Acid, Venous 1.9 0.5 - 1.9 mmol/L    Comment: Performed at [facility], [address] [place], [place] [address]  Blood Culture (routine x 2)     Status: None (Preliminary result)   Collection Time: 09/17/18  1:03 PM  Result Value Ref Range   Specimen Description RIGHT ANTECUBITAL    Special Requests      BOTTLES DRAWN AEROBIC AND ANAEROBIC Blood Culture adequate volume Performed  at [facility], [address] [place], [place] [address]    Culture PENDING    Report Status PENDING   Urinalysis, Routine w reflex microscopic     Status: None   Collection Time: 09/17/18  1:47 PM  Result Value Ref Range   Color, Urine YELLOW YELLOW   APPearance CLEAR CLEAR   Specific Gravity, Urine 1.008 1.005 - 1.030   pH 7.0 5.0 - 8.0   Glucose, UA NEGATIVE NEGATIVE mg/dL   Hgb urine dipstick NEGATIVE NEGATIVE   Bilirubin Urine NEGATIVE NEGATIVE   Ketones, ur NEGATIVE NEGATIVE mg/dL   Protein, ur NEGATIVE NEGATIVE mg/dL   Nitrite NEGATIVE NEGATIVE   Leukocytes, UA NEGATIVE NEGATIVE    Comment: Performed at [facility], [address] [place],  [address]  Lactic acid, plasma     Status: None   Collection Time: 09/17/18  2:34 PM  Result Value Ref Range   Lactic Acid, Venous 1.6 0.5 - 1.9 mmol/L    Comment: Performed at [facility], [address], [place],  [address]   Reviewed CT with Dr. Thornton Papas- looks too proximal to be meckel's no obvious foreign body, could have some closed loop too  Ct Abdomen Pelvis W Contrast  Result Date: 09/17/2018 CLINICAL DATA:  Lower abdominal pain, fever and nausea since 09/14/2018. EXAM: CT ABDOMEN AND PELVIS WITH CONTRAST TECHNIQUE: Multidetector CT imaging of the abdomen and pelvis was performed using the standard protocol following bolus administration of intravenous contrast. CONTRAST:  100 mL ISOVUE-300 IOPAMIDOL (ISOVUE-300) INJECTION 61% COMPARISON:  None. FINDINGS: Lower chest: Mild dependent atelectasis is seen in the lung bases. Heart size is mildly enlarged. Calcific coronary artery disease is noted. No pleural or pericardial effusion. Hepatobiliary: 2-3 tiny stones are seen layering dependently in the gallbladder. No cholecystitis. 2 simple hepatic cysts are identified. The larger is 3.3 cm in diameter. The liver is otherwise unremarkable. Biliary tree appears. Pancreas: Unremarkable. No pancreatic ductal dilatation or  surrounding inflammatory changes. Spleen: Normal in size without focal abnormality. Adrenals/Urinary Tract: Tiny right adrenal adenoma is noted. The left adrenal gland is normal. 3-4 left renal cysts are identified measuring up to 5 cm in diameter. A single small cyst  in the lower pole of the right kidney is noted. Stomach/Bowel: A cluster of loops of jejunum are adhesed together in the left upper quadrant of the abdomen with extensive surrounding inflammatory change. There is a focal perforation of a loop of bowel with contained extraluminal air. More superiorly and to the left, there is an abscess measuring 2.2 cm AP by 3.3 cm transverse by 2.1 cm craniocaudal. Inferior to these inflamed loops, infiltration mesenteric fat without focal fluid collection is consistent with phlegmon. There is no pneumatosis or portal venous gas. There is fairly extensive colonic diverticulosis, most notable in the descending and sigmoid without diverticulitis. The appendix is normal. Stomach appears normal. Vascular/Lymphatic: Aortic atherosclerosis. No enlarged abdominal or pelvic lymph nodes. Reproductive: Prostate is unremarkable. Other: Fat containing bilateral inguinal hernias are seen. There is a small volume of free pelvic fluid. Musculoskeletal: No acute or focal bony abnormality. Lumbar spondylosis and right much worse than left hip osteoarthritis are seen. IMPRESSION: Contained small bowel perforation in the left upper quadrant involving loops of jejunum with both an associated small abscess and phlegmon. No CT signs of bowel ischemia. Calcific aortic and coronary atherosclerosis. Mild cholelithiasis without cholecystitis. Diverticulosis without diverticulitis. Fat containing inguinal hernias. Critical Value/emergent results were called by telephone at the time of interpretation on 09/17/2018 at 3:06 pm to Dr. Daleen Bo , who verbally acknowledged these results. Electronically Signed   By: Inge Rise M.D.   On:  09/17/2018 15:11   Dg Chest Port 1 View  Result Date: 09/17/2018 CLINICAL DATA:  Severe abdominal pain worsening since this past Saturday. Nausea. EXAM: PORTABLE CHEST 1 VIEW COMPARISON:  06/13/2017; 06/05/2017 FINDINGS: Examination is degraded secondary to patient body habitus and portable technique Grossly unchanged enlarged cardiac silhouette and mediastinal contours given persistently reduced lung volumes. There is persistent mild elevation/eventration of the right hemidiaphragm. No focal airspace opacities. No evidence of edema. No pleural effusion or pneumothorax. No acute osseous abnormalities. The bilateral humeral heads appear high-riding in relation to the undersurface of the acromion, unchanged, as could be seen in setting of rotator cuff pathology. IMPRESSION: Cardiomegaly without superimposed acute cardiopulmonary disease on this AP portable examination. Electronically Signed   By: Sandi Mariscal M.D.   On: 09/17/2018 13:44     Assessment & Plan:  KAMEL HAVEN is a 63 y.o. male with a small bowel perforation that is contained but he is fairly tender on exam. He is tachycardic with Chronic A fib and currently BP 694W systolic.  He says he is in considerable pain. We discussed the possible etiologies of this perforation including but not limited to Crohn's or infection, foreign body, Meckel's, Volvulus / closed loop, or a mass/ cancer. We discussed the risk of surgery including but not limited to bleeding, infection, risk of needing resection.  We have crossed him for blood given the Eliquis use.    OR for Exploration  Consent for OR and Blood obtained   All questions were answered to the satisfaction of the patient and family.   Virl Cagey 09/17/2018, 4:18 PM

## 2018-09-17 NOTE — ED Triage Notes (Addendum)
Pt reports severe abdominal pain. Pain began on Saturday and has increased. No vomiting just nauseated. Tender to touch lower abd. Reports black stool. Sent from Dr Malachy Moan office

## 2018-09-17 NOTE — ED Provider Notes (Signed)
[facility] EMERGENCY DEPARTMENT Provider Note   CSN: [id number] Arrival date & time: 09/17/18  1145     History   Chief Complaint Chief Complaint  Patient presents with  . Abdominal Pain    HPI Shaun [name] is a 63 y.o. male.  HPI   Patient presents for evaluation from his doctor's office because of abdominal pain and suspected "diverticulitis."  Reports decreased appetite for several days, neuro weakness, and severe abdominal pain.  He states his stool has been black in color.  No prior similar problems.  No history of diverticulitis.  He has been told that he has diverticulosis, in the past.  He has chronic hip pain requiring daily use of hydrocodone 1/2 pill in the morning and one half in the evening.  No recent constipation.  He has been taking his medicines as prescribed.  There are no other known modifying factors. Past Medical History:  Diagnosis Date  . Arteriosclerotic cardiovascular disease (ASCVD)    Nonobstructive; cath in 2000: 30-40% mid LAD and proximal RCA;normal EF. stress nuclear in 2006-subtle inferoseptal hypoperfusion with reversibility; negative stress EKG; good exercise tolerance  . Asthma   . CHF (congestive heart failure) ([place])   . Colon polyps    30 colon polyps found on first colonoscopy  . Diabetes mellitus without complication ([place])   . Diverticulitis   . DJD (degenerative joint disease)   . GERD (gastroesophageal reflux disease)   . History of kidney stones   . Hyperlipidemia   . Hypertension   . Insomnia   . Obstructive sleep apnea 12/2009   01/26/2010 AHI 83/hr  . Paroxysmal atrial fibrillation ([place]) 10/2004   Onset in 10/2004; recurred 09/2008  . PUD (peptic ulcer disease)    1980s  . RLS (restless legs syndrome)   . Sinusitis     Patient Active Problem List   Diagnosis Date Noted  . Small bowel perforation ([place])   . Peritonitis ([place])   . Abdominal pain, epigastric 05/21/2018  . Esophageal dysphagia 05/21/2018  . GERD  (gastroesophageal reflux disease) 05/21/2018  . Sinusitis   . RLS (restless legs syndrome)   . PUD (peptic ulcer disease)   . Insomnia   . Hyperlipidemia   . Diabetes mellitus without complication ([place])   . CHF (congestive heart failure) ([place])   . Asthma   . Arteriosclerotic cardiovascular disease (ASCVD)   . Acute on chronic diastolic CHF (congestive heart failure) ([place]) 08/17/2017  . Chronic anticoagulation 01/27/2017  . Depression 09/24/2015  . COPD (chronic obstructive pulmonary disease) ([place]) 07/20/2015  . Accelerating angina ([place]) 07/19/2015  . Acute chest pain 07/19/2015  . Genetic testing 09/09/2014  . H/O adenomatous polyp of colon 08/11/2014  . Colon polyps   . Encounter for screening colonoscopy 04/30/2014  . Chest pain 08/30/2013  . Non-insulin treated type 2 diabetes mellitus ([place]) 02/09/2011  . CAD S/P percutaneous coronary angioplasty   . Tobacco abuse   . Hypertension   . Hyperlipidemia LDL goal <100 02/23/2010  . Chronic atrial fibrillation 02/23/2010  . Obstructive sleep apnea 12/27/2009  . Paroxysmal atrial fibrillation ([place]) 10/27/2004    Past Surgical History:  Procedure Laterality Date  . BIOPSY  07/17/2018   Procedure: BIOPSY;  Surgeon: Danie Binder, MD;  Location[facility];  Service: Endoscopy;;  colon  . CARDIAC CATHETERIZATION N/A 07/21/2015   Procedure: Left Heart Cath and Coronary Angiography;  Surgeon: Peter M Martinique, MD;  Location: [place] CV LAB;  Service: Cardiovascular;  Laterality:  N/A;  . CARDIAC CATHETERIZATION N/A 07/21/2015   Procedure: Coronary Stent Intervention;  Surgeon: Peter M Martinique, MD;  Location: [place] CV LAB;  Service: Cardiovascular;  Laterality: N/A;  . COLONOSCOPY N/A 05/19/2014   Dr. Barnie Alderman diverticulosis/moderate external hemorrhoids, >20 simple adenomas. Genetic screening negative.   . COLONOSCOPY WITH PROPOFOL N/A 07/17/2018   Procedure: COLONOSCOPY WITH PROPOFOL;  Surgeon: Danie Binder, MD;   Location: [facility] END[facility] SU[facility];  Service: Endoscopy;  Laterality: N/A;  12:15pm  . ESOPHAGOGASTRODUODENOSCOPY (EGD) WITH PROPOFOL N/A 07/17/2018   Procedure: ESOPHAGOGASTRODUODENOSCOPY (EGD) WITH PROPOFOL;  Surgeon: Danie Binder, MD;  Location: [facility];  Service: Endoscopy;  Laterality: N/A;  . HERNIA REPAIR  1986   Left inguinal  . INTRAVASCULAR PRESSURE WIRE/FFR STUDY Left 06/08/2017   Procedure: INTRAVASCULAR PRESSURE WIRE/FFR STUDY;  Surgeon: Nelva Bush, MD;  Location: [place] CV LAB;  Service: Cardiovascular;  Laterality: Left;  LAD and CFX  . LEFT HEART CATH AND CORONARY ANGIOGRAPHY N/A 06/08/2017   Procedure: LEFT HEART CATH AND CORONARY ANGIOGRAPHY;  Surgeon: Nelva Bush, MD;  Location: [place] CV LAB;  Service: Cardiovascular;  Laterality: N/A;  . POLYPECTOMY  07/17/2018   Procedure: POLYPECTOMY;  Surgeon: Danie Binder, MD;  Location[facility];  Service: Endoscopy;;  colon  . ROTATOR CUFF REPAIR     Right  . SAVORY DILATION N/A 07/17/2018   Procedure: SAVORY DILATION;  Surgeon: Danie Binder, MD;  Location[facility];  Service: Endoscopy;  Laterality: N/A;        Home Medications    Prior to Admission medications   Medication Sig Start Date End Date Taking? Authorizing Provider  apixaban (ELIQUIS) 5 MG TABS tablet Take 1 tablet (5 mg total) by mouth 2 (two) times daily. 09/04/17  Yes BranchAlphonse Guild, MD  atorvastatin (LIPITOR) 80 MG tablet Take 1 tablet (80 mg total) by mouth daily. 06/21/18  Yes Mikey Kirschner, MD  buPROPion [facility] SR) 150 MG 12 hr tablet Take one each morning for 3 days then take one twice daily Patient taking differently: Take 150 mg by mouth 2 (two) times daily.  06/21/18  Yes Mikey Kirschner, MD  escitalopram (LEXAPRO) 20 MG tablet TAKE 1 TABLET(20 MG) BY MOUTH DAILY Patient taking differently: Take 20 mg by mouth daily.  06/07/18  Yes Mikey Kirschner, MD  furosemide (LASIX) 20 MG tablet TAKE 1 TABLET BY  MOUTH EVERY DAY 09/11/18  Yes Crenshaw, Denice Bors, MD  HYDROcodone-acetaminophen (NORCO/VICODIN) 5-325 MG tablet Take one bid prn pain Patient taking differently: Take 0.5 tablets by mouth 2 (two) times daily as needed for moderate pain.  06/21/18  Yes Mikey Kirschner, MD  iron polysaccharides (NU-IRON) 150 MG capsule 1 PO BID FOR 3 MOS 07/17/18  Yes Fields, Sandi L, MD  lisinopril (PRINIVIL,ZESTRIL) 5 MG tablet TAKE 1 TABLET BY MOUTH EVERY DAY Patient taking differently: Take 5 mg by mouth daily.  05/07/18  Yes Lelon Perla, MD  meclizine (ANTIVERT) 25 MG tablet Take 1 tablet (25 mg total) by mouth 3 (three) times daily as needed for dizziness. 10/18/16  Yes Daleen Bo, MD  metFORMIN (GLUCOPHAGE) 500 MG tablet Take 1 tablet (500 mg total) by mouth 2 (two) times daily with a meal. 06/11/17  Yes End, Harrell Gave, MD  metoprolol tartrate (LOPRESSOR) 50 MG tablet TAKE 1 AND 1/2 TABLETS BY MOUTH TWICE DAILY Patient taking differently: Take 75 mg by mouth 2 (two) times daily.  04/06/18  Yes  Arnoldo Lenis, MD  potassium chloride SA (K-DUR,KLOR-CON) 20 MEQ tablet TAKE 1 TABLET BY MOUTH EVERY DAY 09/11/18  Yes Lelon Perla, MD  ranitidine (ZANTAC) 300 MG tablet Take 1 tablet (300 mg total) by mouth daily. WHEN NEEDED TO CONTROL HEARTBURN. 07/17/18  Yes Fields, Sandi L, MD  rOPINIRole (REQUIP) 3 MG tablet Take 1 tablet (3 mg total) by mouth at bedtime. 06/21/18  Yes Mikey Kirschner, MD  sildenafil (VIAGRA) 100 MG tablet TAKE 1 TABLET BY MOUTH EVERY DAY AS NEEDED FOR ERECTILE DYSFUNCTION 07/13/18  Yes Crenshaw, Denice Bors, MD  sodium chloride (OCEAN) 0.65 % SOLN nasal spray Place 1 spray into both nostrils daily as needed for congestion.    Yes [provider]  tetrahydrozoline (VISINE) 0.05 % ophthalmic solution Place 2 drops into both eyes daily as needed (for dry eyes).   Yes [provider]  traZODone (DESYREL) 50 MG tablet TAKE 2 TABLETS(100 MG) BY MOUTH AT BEDTIME Patient  taking differently: Take 100 mg by mouth at bedtime.  05/08/18  Yes Mikey Kirschner, MD  triamcinolone cream (KENALOG) 0.1 % Apply 1 application topically 2 (two) times daily. Patient taking differently: Apply 1 application topically daily as needed (for irritation).  06/30/14  Yes Mikey Kirschner, MD  VENTOLIN HFA 108 (90 Base) MCG/ACT inhaler INHALE 2 PUFFS INTO THE LUNGS EVERY 6 HOURS AS NEEDED FOR WHEEZING OR SHORTNESS OF BREATH Patient taking differently: Inhale 2 puffs into the lungs every 6 (six) hours as needed for wheezing or shortness of breath.  01/26/18  Yes Mikey Kirschner, MD  azithromycin (ZITHROMAX Z-PAK) 250 MG tablet Take 2 tablets (500 mg) on  Day 1,  followed by 1 tablet (250 mg) once daily on Days 2 through 5. Patient not taking: Reported on 09/17/2018 09/03/18   Mikey Kirschner, MD  HYDROcodone-homatropine [facility]) 5-1.5 MG/5ML syrup Take 5 mLs by mouth every 6 (six) hours as needed for cough. Patient not taking: Reported on 09/17/2018 08/30/18   Mikey Kirschner, MD  Na Sulfate-K Sulfate-Mg Sulf 17.5-3.13-1.6 GM/177ML SOLN Take 1 kit by mouth as directed. Patient not taking: Reported on 09/17/2018 05/21/18   Danie Binder, MD  nitroGLYCERIN (NITROSTAT) 0.4 MG SL tablet Place 1 tablet (0.4 mg total) under the tongue every 5 (five) minutes as needed. Patient taking differently: Place 0.4 mg under the tongue every 5 (five) minutes as needed for chest pain.  06/30/15   Lendon Colonel, NP  omeprazole (PRILOSEC) 40 MG capsule Take 1 capsule (40 mg total) by mouth daily. Patient not taking: Reported on 09/17/2018 06/21/18   Mikey Kirschner, MD  ondansetron (ZOFRAN ODT) 4 MG disintegrating tablet Take one every 8 hours as needed for nausea Patient taking differently: Take 4 mg by mouth every 8 (eight) hours as needed for nausea or vomiting.  04/16/18   Mikey Kirschner, MD  oseltamivir (TAMIFLU) 75 MG capsule Take 1 capsule (75 mg total) by mouth 2 (two) times daily. Patient  not taking: Reported on 09/17/2018 08/30/18   Mikey Kirschner, MD    Family History Family History  Problem Relation Age of Onset  . Hypertension Mother   . Breast cancer Mother 49  . Heart attack Father   . Diabetes Brother   . Skin cancer Sister 41  . Brain cancer Maternal Uncle   . Cancer Maternal Uncle        NOS  . Breast cancer Cousin  maternal cousin dx <50  . Colon cancer Neg Hx     Social History Social History   Tobacco Use  . Smoking status: Former Smoker    Packs/day: 0.50    Years: 32.00    Pack years: 16.00    Types: Cigarettes    Start date: 07/24/1970    Last attempt to quit: 08/29/2018    Years since quitting: 0.0  . Smokeless tobacco: Never Used  Substance Use Topics  . Alcohol use: No    Alcohol/week: 0.0 standard drinks  . Drug use: No     Allergies   Patient has no known allergies.   Review of Systems Review of Systems  All other systems reviewed and are negative.    Physical Exam Updated Vital Signs BP (!) 134/120   Pulse (!) 126   Temp 99.6 F (37.6 C) (Oral)   Resp 19   Ht _0  (1.727 m)   Wt 104.3 kg   SpO2 94%   BMI 34.97 kg/m   Physical Exam Vitals signs and nursing note reviewed.  Constitutional:      General: He is in acute distress.     Appearance: He is well-developed. He is obese. He is ill-appearing. He is not diaphoretic.  HENT:     Head: Normocephalic and atraumatic.     Right Ear: External ear normal.     Left Ear: External ear normal.     Mouth/Throat:     Mouth: Mucous membranes are moist.     Pharynx: No pharyngeal swelling or oropharyngeal exudate.  Eyes:     General: No scleral icterus.    Extraocular Movements: Extraocular movements intact.     Conjunctiva/sclera: Conjunctivae normal.     Pupils: Pupils are equal, round, and reactive to light.  Neck:     Musculoskeletal: Normal range of motion and neck supple.     Trachea: Phonation normal.  Cardiovascular:     Rate and Rhythm: Regular  rhythm. Tachycardia present.     Heart sounds: Normal heart sounds.  Pulmonary:     Effort: Pulmonary effort is normal.     Breath sounds: Normal breath sounds.  Abdominal:     General: There is distension.     Palpations: Abdomen is soft. There is no mass.     Tenderness: There is abdominal tenderness (Diffuse, moderate). There is guarding. There is no rebound.     Hernia: No hernia is present.     Comments: No pulsatile mass.  Hypoactive bowel sounds.  Musculoskeletal: Normal range of motion.  Skin:    General: Skin is warm and dry.     Coloration: Skin is not pale.     Findings: No erythema.  Neurological:     Mental Status: He is alert and oriented to person, place, and time.     Cranial Nerves: No cranial nerve deficit.     Sensory: No sensory deficit.     Motor: No abnormal muscle tone.     Coordination: Coordination normal.  Psychiatric:        Mood and Affect: Mood normal.        Behavior: Behavior normal.        Thought Content: Thought content normal.        Judgment: Judgment normal.      ED Treatments / Results  Labs (all labs ordered are listed, but only abnormal results are displayed) Labs Reviewed  COMPREHENSIVE METABOLIC PANEL - Abnormal; Notable for the following components:  Result Value   Glucose, Bld 132 (*)    Calcium 8.6 (*)    Albumin 3.2 (*)    Total Bilirubin 1.3 (*)    All other components within normal limits  CBC - Abnormal; Notable for the following components:   WBC 13.1 (*)    Hemoglobin 10.4 (*)    HCT 34.0 (*)    MCV 73.4 (*)    MCH 22.5 (*)    RDW 19.0 (*)    All other components within normal limits  CULTURE, BLOOD (ROUTINE X 2)  CULTURE, BLOOD (ROUTINE X 2)  URINE CULTURE  LIPASE, BLOOD  URINALYSIS, ROUTINE W REFLEX MICROSCOPIC  LACTIC ACID, PLASMA  LACTIC ACID, PLASMA  POC OCCULT BLOOD, ED  PREPARE RBC (CROSSMATCH)  TYPE AND SCREEN  ABO/RH    EKG EKG Interpretation  Date/Time:  Monday September 17 2018 12:16:14  EST Ventricular Rate:  155 PR Interval:    QRS Duration: 96 QT Interval:  300 QTC Calculation: 482 R Axis:   1 Text Interpretation:  Atrial fibrillation ST depression, probably rate related Borderline prolonged QT interval Baseline wander in lead(s) V3 Since last tracing rate faster Confirmed by Daleen Bo [id number]) on 09/17/2018 12:59:22 PM   Radiology Ct Abdomen Pelvis W Contrast  Result Date: 09/17/2018 CLINICAL DATA:  Lower abdominal pain, fever and nausea since 09/14/2018. EXAM: CT ABDOMEN AND PELVIS WITH CONTRAST TECHNIQUE: Multidetector CT imaging of the abdomen and pelvis was performed using the standard protocol following bolus administration of intravenous contrast. CONTRAST:  100 mL ISOVUE-300 IOPAMIDOL (ISOVUE-300) INJECTION 61% COMPARISON:  None. FINDINGS: Lower chest: Mild dependent atelectasis is seen in the lung bases. Heart size is mildly enlarged. Calcific coronary artery disease is noted. No pleural or pericardial effusion. Hepatobiliary: 2-3 tiny stones are seen layering dependently in the gallbladder. No cholecystitis. 2 simple hepatic cysts are identified. The larger is 3.3 cm in diameter. The liver is otherwise unremarkable. Biliary tree appears. Pancreas: Unremarkable. No pancreatic ductal dilatation or surrounding inflammatory changes. Spleen: Normal in size without focal abnormality. Adrenals/Urinary Tract: Tiny right adrenal adenoma is noted. The left adrenal gland is normal. 3-4 left renal cysts are identified measuring up to 5 cm in diameter. A single small cyst in the lower pole of the right kidney is noted. Stomach/Bowel: A cluster of loops of jejunum are adhesed together in the left upper quadrant of the abdomen with extensive surrounding inflammatory change. There is a focal perforation of a loop of bowel with contained extraluminal air. More superiorly and to the left, there is an abscess measuring 2.2 cm AP by 3.3 cm transverse by 2.1 cm craniocaudal. Inferior to  these inflamed loops, infiltration mesenteric fat without focal fluid collection is consistent with phlegmon. There is no pneumatosis or portal venous gas. There is fairly extensive colonic diverticulosis, most notable in the descending and sigmoid without diverticulitis. The appendix is normal. Stomach appears normal. Vascular/Lymphatic: Aortic atherosclerosis. No enlarged abdominal or pelvic lymph nodes. Reproductive: Prostate is unremarkable. Other: Fat containing bilateral inguinal hernias are seen. There is a small volume of free pelvic fluid. Musculoskeletal: No acute or focal bony abnormality. Lumbar spondylosis and right much worse than left hip osteoarthritis are seen. IMPRESSION: Contained small bowel perforation in the left upper quadrant involving loops of jejunum with both an associated small abscess and phlegmon. No CT signs of bowel ischemia. Calcific aortic and coronary atherosclerosis. Mild cholelithiasis without cholecystitis. Diverticulosis without diverticulitis. Fat containing inguinal hernias. Critical Value/emergent results were called by telephone at  the time of interpretation on 09/17/2018 at 3:06 pm to Dr. Daleen Bo , who verbally acknowledged these results. Electronically Signed   By: Inge Rise M.D.   On: 09/17/2018 15:11   Dg Chest Port 1 View  Result Date: 09/17/2018 CLINICAL DATA:  Severe abdominal pain worsening since this past Saturday. Nausea. EXAM: PORTABLE CHEST 1 VIEW COMPARISON:  06/13/2017; 06/05/2017 FINDINGS: Examination is degraded secondary to patient body habitus and portable technique Grossly unchanged enlarged cardiac silhouette and mediastinal contours given persistently reduced lung volumes. There is persistent mild elevation/eventration of the right hemidiaphragm. No focal airspace opacities. No evidence of edema. No pleural effusion or pneumothorax. No acute osseous abnormalities. The bilateral humeral heads appear high-riding in relation to the  undersurface of the acromion, unchanged, as could be seen in setting of rotator cuff pathology. IMPRESSION: Cardiomegaly without superimposed acute cardiopulmonary disease on this AP portable examination. Electronically Signed   By: Sandi Mariscal M.D.   On: 09/17/2018 13:44    Procedures .Critical Care Performed by: Daleen Bo, MD Authorized by: Daleen Bo, MD   Critical care provider statement:    Critical care time (minutes):  35   Critical care start time:  09/17/2018 12:20 PM   Critical care end time:  09/17/2018 3:39 PM   Critical care time was exclusive of:  Separately billable procedures and treating other patients   Critical care was necessary to treat or prevent imminent or life-threatening deterioration of the following conditions:  Sepsis   Critical care was time spent personally by me on the following activities:  Blood draw for specimens, development of treatment plan with patient or surrogate, discussions with consultants, evaluation of patient's response to treatment, examination of patient, obtaining history from patient or surrogate, ordering and performing treatments and interventions, ordering and review of laboratory studies, pulse oximetry, re-evaluation of patient's condition, review of old charts and ordering and review of radiographic studies   (including critical care time)  Medications Ordered in ED Medications  metroNIDAZOLE (FLAGYL) IVPB 500 mg ( Intravenous Automatically Held 09/25/18 2045)  ceFEPIme (MAXIPIME) 2 g in sodium chloride 0.9 % 100 mL IVPB ( Intravenous Automatically Held 09/25/18 2200)  Chlorhexidine Gluconate Cloth 2 % PADS 6 each (has no administration in time range)  sodium chloride 0.9 % bolus 1,000 mL (0 mLs Intravenous Stopped 09/17/18 1415)    And  sodium chloride 0.9 % bolus 1,000 mL (0 mLs Intravenous Stopped 09/17/18 1346)    And  sodium chloride 0.9 % bolus 1,000 mL (0 mLs Intravenous Stopped 09/17/18 1500)    And  sodium chloride 0.9 %  bolus 500 mL (0 mLs Intravenous Stopped 09/17/18 1523)  ondansetron (ZOFRAN) injection 4 mg (4 mg Intravenous Given 09/17/18 1310)  iopamidol (ISOVUE-300) 61 % injection 100 mL (100 mLs Intravenous Contrast Given 09/17/18 1350)  0.9 %  sodium chloride infusion ( Intravenous New Bag/Given 09/17/18 1550)  cefoTEtan (CEFOTAN) 2 g in sodium chloride 0.9 % 100 mL IVPB (2 g Intravenous New Bag/Given 09/17/18 1728)  morphine 4 MG/ML injection 4 mg (4 mg Intravenous Given 09/17/18 1626)     Initial Impression / Assessment and Plan / ED Course  I have reviewed the triage vital signs and the nursing notes.  Pertinent labs & imaging results that were available during my care of the patient were reviewed by me and considered in my medical decision making (see chart for details).  Clinical Course as of Sep 17 1746  Mon Sep 17, 2018  1300 No  perforation, CHF or infiltrate  DG Chest Port 1 View [EW]  1516 Normal  Lactic acid, plasma [EW]  1516 Normal except glucose high, calcium low, albumin low, total bilirubin high  Comprehensive metabolic panel(!) [EW]  6629 Normal  Urinalysis, Routine w reflex microscopic [EW]  1516 Normal except white count high, hemoglobin low  CBC(!) [EW]  1517 Normal  Lipase, blood [EW]  1517 No pneumonia or perforation, images reviewed by me  EKG 12-Lead [EW]  1517 Small bowel perforation with small bowel inflammatory changes and abscess, images reviewed by me  CT Abdomen Pelvis W Contrast [EW]  1528 Discussed with general surgery, Dr. Constance Haw who will the patient in the ED and arrange for operative management.   [EW]    Clinical Course User Index [EW] Daleen Bo, MD     Patient Vitals for the past 24 hrs:  BP Temp Temp src Pulse Resp SpO2 Height Weight  09/17/18 1630 (!) 134/120 - - (!) 126 19 94 % - -  09/17/18 1600 (!) 136/122 - - (!) 150 (!) 22 95 % - -  09/17/18 1530 (!) 144/83 - - (!) 129 20 96 % - -  09/17/18 1500 137/77 - - (!) 133 (!) 23 95 % - -    09/17/18 1451 137/83 - - (!) 126 (!) 24 98 % - -  09/17/18 1427 (!) 133/99 - - (!) 135 15 96 % - -  09/17/18 1400 (!) 133/99 - - (!) 139 20 95 % - -  09/17/18 1330 125/87 - - (!) 135 16 93 % - -  09/17/18 1305 (!) 130/101 - - - 15 - - -  09/17/18 1219 99/71 - - - - - - -  09/17/18 1216 (!) 75/43 99.6 F (37.6 C) Oral (!) 144 (!) 22 97 % - -  09/17/18 1212 - - - - - - _0  (1.727 m) 104.3 kg    3:30 PM Reevaluation with update and discussion. After initial assessment and treatment, an updated evaluation reveals he is more comfortable.  Findings discussed with the patient and his wife, all questions answered. Daleen Bo   Medical Decision Making: Acute abdominal pain with peritonitis, and bowel perforation on CT imaging.  Also phlegmon with partial small bowel obstruction.  Patient acutely ill, requires aggressive management and treatment by general surgery.  Perforated viscus will dispose the patient to potential sepsis and hemodynamic instability.  CRITICAL CARE-yes Performed by: Daleen Bo  Nursing Notes Reviewed/ Care Coordinated Applicable Imaging Reviewed Interpretation of Laboratory Data incorporated into ED treatment  Plan: Admit  Final Clinical Impressions(s) / ED Diagnoses   Final diagnoses:  Sepsis, due to unspecified organism, unspecified whether acute organ dysfunction present [facility])  Perforated intestine ([place])  Small bowel obstruction [facility])    ED Discharge Orders    None       Daleen Bo, MD 09/17/18 1749

## 2018-09-17 NOTE — ED Notes (Signed)
O2 sats ranging 88-89% following fentanyl. O2 applied per [place] at 2L. O2 sats now 92-95%

## 2018-09-17 NOTE — Op Note (Signed)
[facility] Operative Note  09/17/18  Preoperative Diagnosis: Small bowel perforation, peritonitis    Postoperative Diagnosis: Small bowel perforation with abscess and purulent drainage, phimosis of the penis    Procedure(s) Performed: Exploratory laparotomy, small bowel resection, primary side to side stapled anastomosis    Surgeon: Lanell Matar. Constance Haw, MD   Assistants: No qualified resident was available    Anesthesia: General endotracheal   Anesthesiologist: Nicanor Alcon, MD    Specimens: Jejunum   Estimated Blood Loss: 100cc    Blood Replacement: None    Complications: None   Wound Class: Dirty/ Infected    Operative Indications: Mr. Baby is a 63 yo who presented to the Ed with abdominal pain and some reported melena since Friday. He had been suspected of having diverticulitis and underwent a CT scan that actually showed some dilated small bowel with abscess and perforation. He was peritoneal on exam and tachycardic (A fib +/- septic response). Given these findings and his exam, he was taken to the OR for exploration. We discussed the unknown etiology of the perforation and the possibility of a Meckel's diverticulum, foreign body, mass/ cancer, or Crohn's. He had no prior history of any IBD/ Crohn's or UC in himself or his family.  We discussed the risk and benefits of surgery including but not limited to bleeding, especially given his Eliquis, infection, risk of needing a bowel resection, and possibility of septic shock and ICU admission .  Findings: Inflamed small bowel with purulent drainage and abscess, hard mass in a conglomeration of bowel loops    Procedure: The patient was taken to the operating room and placed supine. General endotracheal anesthesia was induced. Intravenous antibiotics were  administered per protocol.  An orogastric tube positioned to decompress the stomach.  A foley catheter was placed after some difficulty as he has phimosis of  the penis. The abdomen was prepared and draped in the usual sterile fashion.   A midline incision was made and carried down through the fascia with electrocautery. The abdomen was entered bluntly with a hemostat through a fatty peritoneal lining.  On entry, murky ascites was noted.  The small bowel was eviscerated and ran. A proximal loop of jejunum was noted to dilated and stuck together was a hardened mass between the loops. There was some nodular deposits on the mesentery of unknown significance.  The remaining distal bowel was ran and no further pathology was noted aside from the nodular deposits on the mesentery.    A linear cutting stapler was used to transect healthy bowel proximally and distally to the conglomeration of jejunum with the that was affected by the perforation.  The mesentery was ligated with a Ligasure.  The two ends of small bowel were re-approximated and enterotomies were made after laparotomy pads were laid down to prevent spillage. In proper alignment without any twisting of the mesentery a linear cutting stapler was used to create a side to side anastomosis. A TIA stapler was used to close the common enterotomy. The staple line was oversewn with 3-0 silk suture and a crotch stitch was placed.   The abdomen was irrigated. Hemostasis was confirmed. The small bowel was ran again and no additional pathology was noted. There was a small mesenteric rent at the distal ileum but the ileum as healthy and no signs of any ischemia were noted.  The bowel was placed in the abdomen without twisting. The fascia was closed with running 0 PDS suture. The skin was closed at  the umbilicus to create two open wounds due to the patient's body habitus and the purulent drainage.  The wounds were packed with Kerlix and an ABD pad was placed over the area.   Final inspection revealed acceptable hemostasis. All counts were correct at the end of the case. The patient was awakened from anesthesia and extubate  without complication.  The patient went to the PACU in stable condition.   Curlene Labrum, MD [facility] [address] [place], [place] [address] [phone] (office)

## 2018-09-17 NOTE — Anesthesia Procedure Notes (Signed)
Procedure Name: Intubation Date/Time: 09/17/2018 5:40 PM Performed by: Nicanor Alcon, MD Pre-anesthesia Checklist: Patient identified, Patient being monitored, Timeout performed, Emergency Drugs available and Suction available Patient Re-evaluated:Patient Re-evaluated prior to induction Oxygen Delivery Method: Circle System Utilized Preoxygenation: Pre-oxygenation with 100% oxygen Induction Type: IV induction Ventilation: Mask ventilation without difficulty Laryngoscope Size: Mac and 4 Grade View: Grade II Tube type: Oral Tube size: 7.0 mm Number of attempts: 1 Airway Equipment and Method: Stylet Placement Confirmation: ETT inserted through vocal cords under direct vision,  positive ETCO2 and breath sounds checked- equal and bilateral Secured at: 21 cm Tube secured with: Tape Dental Injury: Teeth and Oropharynx as per pre-operative assessment

## 2018-09-17 NOTE — Anesthesia Postprocedure Evaluation (Signed)
Anesthesia Post Note  Patient: Shaun [name]  Procedure(s) Performed: EXPLORATORY LAPAROTOMY (N/A ) SMALL BOWEL RESECTION (Abdomen)  Patient location during evaluation: PACU Anesthesia Type: General Level of consciousness: awake and alert Pain management: pain level controlled Vital Signs Assessment: post-procedure vital signs reviewed and stable Respiratory status: spontaneous breathing, nonlabored ventilation, respiratory function stable and patient connected to nasal cannula oxygen Cardiovascular status: blood pressure returned to baseline and stable Postop Assessment: no apparent nausea or vomiting Anesthetic complications: no     Last Vitals:  Vitals:   09/17/18 1909 09/17/18 1925  BP: 109/70   Pulse: 90   Resp: 17   Temp: 37.1 C   SpO2: 92% 93%    Last Pain:  Vitals:   09/17/18 1216  TempSrc: Oral  PainSc:                  Nicanor Alcon

## 2018-09-18 ENCOUNTER — Encounter (HOSPITAL_COMMUNITY): Payer: Self-pay | Admitting: General Surgery

## 2018-09-18 LAB — CBC WITH DIFFERENTIAL/PLATELET
Abs Immature Granulocytes: 0.07 10*3/uL (ref 0.00–0.07)
BASOS ABS: 0 10*3/uL (ref 0.0–0.1)
Basophils Relative: 0 %
Eosinophils Absolute: 0 10*3/uL (ref 0.0–0.5)
Eosinophils Relative: 0 %
HEMATOCRIT: 31 % — AB (ref 39.0–52.0)
Hemoglobin: 9.1 g/dL — ABNORMAL LOW (ref 13.0–17.0)
Immature Granulocytes: 1 %
LYMPHS ABS: 0.7 10*3/uL (ref 0.7–4.0)
Lymphocytes Relative: 7 %
MCH: 22.7 pg — ABNORMAL LOW (ref 26.0–34.0)
MCHC: 29.4 g/dL — ABNORMAL LOW (ref 30.0–36.0)
MCV: 77.3 fL — ABNORMAL LOW (ref 80.0–100.0)
Monocytes Absolute: 0.5 10*3/uL (ref 0.1–1.0)
Monocytes Relative: 5 %
Neutro Abs: 9.4 10*3/uL — ABNORMAL HIGH (ref 1.7–7.7)
Neutrophils Relative %: 87 %
Platelets: 230 10*3/uL (ref 150–400)
RBC: 4.01 MIL/uL — ABNORMAL LOW (ref 4.22–5.81)
RDW: 18.9 % — ABNORMAL HIGH (ref 11.5–15.5)
WBC: 10.7 10*3/uL — ABNORMAL HIGH (ref 4.0–10.5)
nRBC: 0 % (ref 0.0–0.2)

## 2018-09-18 LAB — BASIC METABOLIC PANEL
Anion gap: 8 (ref 5–15)
BUN: 15 mg/dL (ref 8–23)
CO2: 21 mmol/L — ABNORMAL LOW (ref 22–32)
Calcium: 7.6 mg/dL — ABNORMAL LOW (ref 8.9–10.3)
Chloride: 110 mmol/L (ref 98–111)
Creatinine, Ser: 0.94 mg/dL (ref 0.61–1.24)
GFR calc Af Amer: >60 mL/min (ref >60)
GFR calc non Af Amer: >60 mL/min (ref >60)
Glucose, Bld: 170 mg/dL — ABNORMAL HIGH (ref 70–99)
Potassium: 4.6 mmol/L (ref 3.5–5.1)
Sodium: 139 mmol/L (ref 135–145)

## 2018-09-18 LAB — PHOSPHORUS: Phosphorus: 2.6 mg/dL (ref 2.5–4.6)

## 2018-09-18 LAB — MRSA PCR SCREENING: MRSA BY PCR: NEGATIVE

## 2018-09-18 LAB — MAGNESIUM: Magnesium: 1.9 mg/dL (ref 1.7–2.4)

## 2018-09-18 NOTE — Progress Notes (Signed)
[facility] Progress Note  1 Day Post-Op  Subjective: No major issues. NG in place. Some minor flatus but distended. Foley in place.  HD doing well.   Objective: Vital signs in last 24 hours: Temp:  [97.5 F (36.4 C)-99.4 F (37.4 C)] 99.4 F (37.4 C) (01/21 1658) Pulse Rate:  [41-156] 90 (01/21 1842) Resp:  [13-20] 19 (01/21 1842) BP: (98-134)/(64-88) 131/87 (01/21 1842) SpO2:  [88 %-98 %] 93 % (01/21 1842) FiO2 (%):  [0 %] 0 % (01/20 1958) Weight:  [106.9 kg] 106.9 kg (01/21 0500) Last BM Date: 09/17/18  Intake/Output from previous day: 01/20 0701 - 01/21 0700 In: 6180.2 [I.V.:2264.5; IV Piggyback:3915.7] Out: 625 [Urine:550; Blood:75] Intake/Output this shift: No intake/output data recorded.  General appearance: alert, cooperative and no distress Resp: normal work breathing GI: soft, distended, wound clean and fascia intact, abd binder  Lab Results:  Recent Labs    09/17/18 1229 09/18/18 1038  WBC 13.1* 10.7*  HGB 10.4* 9.1*  HCT 34.0* 31.0*  PLT 278 230   BMET Recent Labs    09/17/18 1229 09/18/18 0427  NA 135 139  K 3.7 4.6  CL 101 110  CO2 23 21*  GLUCOSE 132* 170*  BUN 10 15  CREATININE 0.91 0.94  CALCIUM 8.6* 7.6*    Studies/Results: Ct Abdomen Pelvis W Contrast  Result Date: 09/17/2018 CLINICAL DATA:  Lower abdominal pain, fever and nausea since 09/14/2018. EXAM: CT ABDOMEN AND PELVIS WITH CONTRAST TECHNIQUE: Multidetector CT imaging of the abdomen and pelvis was performed using the standard protocol following bolus administration of intravenous contrast. CONTRAST:  100 mL ISOVUE-300 IOPAMIDOL (ISOVUE-300) INJECTION 61% COMPARISON:  None. FINDINGS: Lower chest: Mild dependent atelectasis is seen in the lung bases. Heart size is mildly enlarged. Calcific coronary artery disease is noted. No pleural or pericardial effusion. Hepatobiliary: 2-3 tiny stones are seen layering dependently in the gallbladder. No cholecystitis. 2 simple  hepatic cysts are identified. The larger is 3.3 cm in diameter. The liver is otherwise unremarkable. Biliary tree appears. Pancreas: Unremarkable. No pancreatic ductal dilatation or surrounding inflammatory changes. Spleen: Normal in size without focal abnormality. Adrenals/Urinary Tract: Tiny right adrenal adenoma is noted. The left adrenal gland is normal. 3-4 left renal cysts are identified measuring up to 5 cm in diameter. A single small cyst in the lower pole of the right kidney is noted. Stomach/Bowel: A cluster of loops of jejunum are adhesed together in the left upper quadrant of the abdomen with extensive surrounding inflammatory change. There is a focal perforation of a loop of bowel with contained extraluminal air. More superiorly and to the left, there is an abscess measuring 2.2 cm AP by 3.3 cm transverse by 2.1 cm craniocaudal. Inferior to these inflamed loops, infiltration mesenteric fat without focal fluid collection is consistent with phlegmon. There is no pneumatosis or portal venous gas. There is fairly extensive colonic diverticulosis, most notable in the descending and sigmoid without diverticulitis. The appendix is normal. Stomach appears normal. Vascular/Lymphatic: Aortic atherosclerosis. No enlarged abdominal or pelvic lymph nodes. Reproductive: Prostate is unremarkable. Other: Fat containing bilateral inguinal hernias are seen. There is a small volume of free pelvic fluid. Musculoskeletal: No acute or focal bony abnormality. Lumbar spondylosis and right much worse than left hip osteoarthritis are seen. IMPRESSION: Contained small bowel perforation in the left upper quadrant involving loops of jejunum with both an associated small abscess and phlegmon. No CT signs of bowel ischemia. Calcific aortic and coronary atherosclerosis. Mild cholelithiasis without cholecystitis. Diverticulosis  without diverticulitis. Fat containing inguinal hernias. Critical Value/emergent results were called by  telephone at the time of interpretation on 09/17/2018 at 3:06 pm to Dr. Daleen Bo , who verbally acknowledged these results. Electronically Signed   By: Inge Rise M.D.   On: 09/17/2018 15:11   Dg Chest Port 1 View  Result Date: 09/17/2018 CLINICAL DATA:  Severe abdominal pain worsening since this past Saturday. Nausea. EXAM: PORTABLE CHEST 1 VIEW COMPARISON:  06/13/2017; 06/05/2017 FINDINGS: Examination is degraded secondary to patient body habitus and portable technique Grossly unchanged enlarged cardiac silhouette and mediastinal contours given persistently reduced lung volumes. There is persistent mild elevation/eventration of the right hemidiaphragm. No focal airspace opacities. No evidence of edema. No pleural effusion or pneumothorax. No acute osseous abnormalities. The bilateral humeral heads appear high-riding in relation to the undersurface of the acromion, unchanged, as could be seen in setting of rotator cuff pathology. IMPRESSION: Cardiomegaly without superimposed acute cardiopulmonary disease on this AP portable examination. Electronically Signed   By: Sandi Mariscal M.D.   On: 09/17/2018 13:44    Anti-infectives: Anti-infectives (From admission, onward)   Start     Dose/Rate Route Frequency Ordered Stop   09/18/18 0600  cefoTEtan (CEFOTAN) 2 g in sodium chloride 0.9 % 100 mL IVPB     2 g 200 mL/hr over 30 Minutes Intravenous On call to O.R. 09/17/18 1621 09/18/18 0626   09/18/18 0600  cefoTEtan (CEFOTAN) 2 g in sodium chloride 0.9 % 100 mL IVPB     2 g 200 mL/hr over 30 Minutes Intravenous Every 12 hours 09/17/18 1958 09/19/18 1759   09/17/18 1300  ceFEPIme (MAXIPIME) 2 g in sodium chloride 0.9 % 100 mL IVPB  Status:  Discontinued     2 g 200 mL/hr over 30 Minutes Intravenous Every 8 hours 09/17/18 1249 09/18/18 0940   09/17/18 1245  metroNIDAZOLE (FLAGYL) IVPB 500 mg  Status:  Discontinued     500 mg 100 mL/hr over 60 Minutes Intravenous Every 8 hours 09/17/18 1233 09/17/18  1958      Assessment/Plan: Mr. Traweek is POD 1 s/p Ex lap, SBR for unknown perforation. Doing fair. NO signs of bleeding. NG in place.  Continue cardiac monitor NPO, NG until bowel function H&H drifting, monitor / holding anticoagulation for now  PRN for pain IVF running  Cefotetan for post operative prophylaxis    LOS: 1 day    Virl Cagey 09/18/2018

## 2018-09-18 NOTE — Progress Notes (Addendum)
Pt abdominal incision was changed and packed with wet to dry dressing. Wound bed looked good and showed no sign of infection. abd binder was reapplied and pt received pain medication. Family member was at bed side and watched and verbalized understanding of dressing.

## 2018-09-19 ENCOUNTER — Ambulatory Visit: Payer: 59 | Admitting: Family Medicine

## 2018-09-19 ENCOUNTER — Inpatient Hospital Stay (HOSPITAL_COMMUNITY): Payer: 59

## 2018-09-19 LAB — BLOOD GAS, ARTERIAL
Acid-Base Excess: 0.9 mmol/L (ref 0.0–2.0)
Bicarbonate: 25.3 mmol/L (ref 20.0–28.0)
O2 Content: 10 L/min
O2 Saturation: 92.7 %
PATIENT TEMPERATURE: 37
PO2 ART: 69.9 mmHg — AB (ref 83.0–108.0)
pCO2 arterial: 35.5 mmHg (ref 32.0–48.0)
pH, Arterial: 7.452 — ABNORMAL HIGH (ref 7.350–7.450)

## 2018-09-19 LAB — URINE CULTURE: Culture: NO GROWTH

## 2018-09-19 LAB — CBC WITH DIFFERENTIAL/PLATELET
Abs Immature Granulocytes: 0.06 10*3/uL (ref 0.00–0.07)
Basophils Absolute: 0 10*3/uL (ref 0.0–0.1)
Basophils Relative: 0 %
Eosinophils Absolute: 0 10*3/uL (ref 0.0–0.5)
Eosinophils Relative: 0 %
HCT: 31.5 % — ABNORMAL LOW (ref 39.0–52.0)
Hemoglobin: 8.9 g/dL — ABNORMAL LOW (ref 13.0–17.0)
Immature Granulocytes: 1 %
Lymphocytes Relative: 12 %
Lymphs Abs: 1.5 10*3/uL (ref 0.7–4.0)
MCH: 22.1 pg — ABNORMAL LOW (ref 26.0–34.0)
MCHC: 28.3 g/dL — ABNORMAL LOW (ref 30.0–36.0)
MCV: 78.4 fL — ABNORMAL LOW (ref 80.0–100.0)
MONO ABS: 0.7 10*3/uL (ref 0.1–1.0)
Monocytes Relative: 6 %
Neutro Abs: 10.1 10*3/uL — ABNORMAL HIGH (ref 1.7–7.7)
Neutrophils Relative %: 81 %
Platelets: 249 10*3/uL (ref 150–400)
RBC: 4.02 MIL/uL — ABNORMAL LOW (ref 4.22–5.81)
RDW: 19.3 % — ABNORMAL HIGH (ref 11.5–15.5)
WBC: 12.4 10*3/uL — AB (ref 4.0–10.5)
nRBC: 0 % (ref 0.0–0.2)

## 2018-09-19 LAB — BASIC METABOLIC PANEL
Anion gap: 10 (ref 5–15)
BUN: 21 mg/dL (ref 8–23)
CO2: 23 mmol/L (ref 22–32)
Calcium: 7.9 mg/dL — ABNORMAL LOW (ref 8.9–10.3)
Chloride: 105 mmol/L (ref 98–111)
Creatinine, Ser: 0.92 mg/dL (ref 0.61–1.24)
GFR calc Af Amer: >60 mL/min (ref >60)
GFR calc non Af Amer: >60 mL/min (ref >60)
Glucose, Bld: 109 mg/dL — ABNORMAL HIGH (ref 70–99)
Potassium: 3.8 mmol/L (ref 3.5–5.1)
SODIUM: 138 mmol/L (ref 135–145)

## 2018-09-19 LAB — PHOSPHORUS: Phosphorus: 3.3 mg/dL (ref 2.5–4.6)

## 2018-09-19 LAB — MAGNESIUM: Magnesium: 2 mg/dL (ref 1.7–2.4)

## 2018-09-19 MED ORDER — HEPARIN SODIUM (PORCINE) 5000 UNIT/ML IJ SOLN
5000.0000 [IU] | Freq: Three times a day (TID) | INTRAMUSCULAR | Status: DC
  Administered 2018-09-19 – 2018-09-20 (×3): 5000 [IU] via SUBCUTANEOUS
  Filled 2018-09-19 (×3): qty 1

## 2018-09-19 MED ORDER — METOPROLOL TARTRATE 5 MG/5ML IV SOLN
7.5000 mg | Freq: Four times a day (QID) | INTRAVENOUS | Status: DC | PRN
  Administered 2018-09-19: 7.5 mg via INTRAVENOUS
  Filled 2018-09-19: qty 10

## 2018-09-19 MED ORDER — FUROSEMIDE 10 MG/ML IJ SOLN
20.0000 mg | Freq: Once | INTRAMUSCULAR | Status: AC
  Administered 2018-09-19: 20 mg via INTRAVENOUS
  Filled 2018-09-19: qty 2

## 2018-09-19 MED ORDER — TRAZODONE HCL 50 MG PO TABS
100.0000 mg | ORAL_TABLET | Freq: Every day | ORAL | Status: DC
  Administered 2018-09-19 – 2018-09-21 (×3): 100 mg via ORAL
  Filled 2018-09-19 (×4): qty 2

## 2018-09-19 MED ORDER — ORAL CARE MOUTH RINSE
15.0000 mL | Freq: Two times a day (BID) | OROMUCOSAL | Status: DC
  Administered 2018-09-19 – 2018-09-22 (×7): 15 mL via OROMUCOSAL

## 2018-09-19 MED ORDER — ROPINIROLE HCL 1 MG PO TABS
3.0000 mg | ORAL_TABLET | Freq: Every day | ORAL | Status: DC
  Administered 2018-09-19 – 2018-09-21 (×3): 3 mg via ORAL
  Filled 2018-09-19 (×6): qty 3

## 2018-09-19 NOTE — Progress Notes (Signed)
Pt requested use of a hospital CPAP with his home settings so he could try to sleep. Pt placed on CPAP with O2 inline

## 2018-09-19 NOTE — Addendum Note (Signed)
Addendum  created 09/19/18 1531 by Vista Deck, CRNA   Clinical Note Signed

## 2018-09-19 NOTE — Progress Notes (Signed)
Abdominal dressing changed with no issues. Patient tolerated well. Small amount of drainage on gauze, serosanguinous. Abdominal binder in place.

## 2018-09-19 NOTE — Progress Notes (Addendum)
[facility] Progress Note  2 Days Post-Op  Subjective: Requiring O2 esp at night. NG output but no flatus or BM. Feels like he needs to have BM. Up in the chair position of the bed. Looks some what uncomfortable.   Objective: Vital signs in last 24 hours: Temp:  [97.7 F (36.5 C)-99.4 F (37.4 C)] 98.3 F (36.8 C) (01/22 1123) Pulse Rate:  [65-138] 102 (01/22 1200) Resp:  [13-25] 22 (01/22 1200) BP: (113-157)/(73-104) 140/85 (01/22 1200) SpO2:  [86 %-94 %] 86 % (01/22 1200) Weight:  [107.2 kg] 107.2 kg (01/22 0500) Last BM Date: 09/17/18  Intake/Output from previous day: 01/21 0701 - 01/22 0700 In: 2563 [I.V.:2278.7; IV Piggyback:284.4] Out: 2919 [Urine:1275] Intake/Output this shift: No intake/output data recorded.  General appearance: alert, cooperative and no distress Resp: increased work breathing; low lung volumes GI: soft, distended, dressing packed, healthy and fascia intact, appropriately tender Extremities: extremities normal, atraumatic, no cyanosis or edema  Lab Results:  Recent Labs    09/18/18 1038 09/19/18 0422  WBC 10.7* 12.4*  HGB 9.1* 8.9*  HCT 31.0* 31.5*  PLT 230 249   BMET Recent Labs    09/18/18 0427 09/19/18 0422  NA 139 138  K 4.6 3.8  CL 110 105  CO2 21* 23  GLUCOSE 170* 109*  BUN 15 21  CREATININE 0.94 0.92  CALCIUM 7.6* 7.9*     Assessment/Plan: Mr. Blodgett is a 63 POD 2 s/p Ex lap, SBR for perforation from enteritis and abscess. Pathology without malignancy.  PRN For pain IS, OOB/ to chair position HD with A fib, rate control not adequate, home med is metoprolol 75 BID, on lopressor 5q6, this is not equivalent, spoke with pharmacy and changed to metoprolol 7.5q6 IV  BP wnl  NPO, NG in place until have bowel function See GI in a few weeks, will ask them to weigh in on any if they have any concern for any Crohn's but nothing on the pathology points to this  WBC up slightly, H&H stable, monitor  Resolving  sepsis related to small bowel perforation with intraabdominal abscess, completed post operative antibiotics  SCDs, adding heparin sq for now, will hold on restarting Eliquis until taking po and one more day of H&H stable    LOS: 2 days    Virl Cagey 09/19/2018

## 2018-09-19 NOTE — Anesthesia Postprocedure Evaluation (Signed)
Anesthesia Post Note  Patient: Shaun [name]  Procedure(s) Performed: EXPLORATORY LAPAROTOMY (N/A ) SMALL BOWEL RESECTION (Abdomen)  Patient location during evaluation: ICU Anesthesia Type: General Level of consciousness: awake and alert and patient cooperative Pain management: satisfactory to patient Vital Signs Assessment: post-procedure vital signs reviewed and stable Respiratory status: spontaneous breathing and patient connected to nasal cannula oxygen Cardiovascular status: stable Postop Assessment: no apparent nausea or vomiting Anesthetic complications: no     Last Vitals:  Vitals:   09/19/18 1200 09/19/18 1511  BP: 140/85   Pulse: (!) 102   Resp: (!) 22   Temp:    SpO2: (!) 86% 94%    Last Pain:  Vitals:   09/19/18 1143  TempSrc:   PainSc: 2                  [name]

## 2018-09-20 LAB — CBC WITH DIFFERENTIAL/PLATELET
Abs Immature Granulocytes: 0.04 10*3/uL (ref 0.00–0.07)
Basophils Absolute: 0 10*3/uL (ref 0.0–0.1)
Basophils Relative: 0 %
EOS ABS: 0.1 10*3/uL (ref 0.0–0.5)
Eosinophils Relative: 1 %
HEMATOCRIT: 30.2 % — AB (ref 39.0–52.0)
Hemoglobin: 9 g/dL — ABNORMAL LOW (ref 13.0–17.0)
Immature Granulocytes: 1 %
Lymphocytes Relative: 16 %
Lymphs Abs: 1.3 10*3/uL (ref 0.7–4.0)
MCH: 22.8 pg — ABNORMAL LOW (ref 26.0–34.0)
MCHC: 29.8 g/dL — ABNORMAL LOW (ref 30.0–36.0)
MCV: 76.5 fL — AB (ref 80.0–100.0)
Monocytes Absolute: 0.7 10*3/uL (ref 0.1–1.0)
Monocytes Relative: 9 %
Neutro Abs: 5.9 10*3/uL (ref 1.7–7.7)
Neutrophils Relative %: 73 %
PLATELETS: 244 10*3/uL (ref 150–400)
RBC: 3.95 MIL/uL — ABNORMAL LOW (ref 4.22–5.81)
RDW: 19 % — AB (ref 11.5–15.5)
WBC: 8 10*3/uL (ref 4.0–10.5)
nRBC: 0 % (ref 0.0–0.2)

## 2018-09-20 LAB — BASIC METABOLIC PANEL
Anion gap: 7 (ref 5–15)
BUN: 22 mg/dL (ref 8–23)
CO2: 28 mmol/L (ref 22–32)
Calcium: 8.1 mg/dL — ABNORMAL LOW (ref 8.9–10.3)
Chloride: 105 mmol/L (ref 98–111)
Creatinine, Ser: 0.9 mg/dL (ref 0.61–1.24)
GFR calc Af Amer: >60 mL/min (ref >60)
Glucose, Bld: 120 mg/dL — ABNORMAL HIGH (ref 70–99)
POTASSIUM: 4.4 mmol/L (ref 3.5–5.1)
Sodium: 140 mmol/L (ref 135–145)

## 2018-09-20 LAB — MAGNESIUM: Magnesium: 2.1 mg/dL (ref 1.7–2.4)

## 2018-09-20 LAB — PHOSPHORUS: Phosphorus: 4 mg/dL (ref 2.5–4.6)

## 2018-09-20 MED ORDER — BUPROPION HCL ER (SR) 150 MG PO TB12
150.0000 mg | ORAL_TABLET | Freq: Two times a day (BID) | ORAL | Status: DC
  Administered 2018-09-20 – 2018-09-22 (×4): 150 mg via ORAL
  Filled 2018-09-20 (×4): qty 1

## 2018-09-20 MED ORDER — FUROSEMIDE 10 MG/ML IJ SOLN
20.0000 mg | Freq: Once | INTRAMUSCULAR | Status: AC
  Administered 2018-09-20: 20 mg via INTRAVENOUS
  Filled 2018-09-20: qty 2

## 2018-09-20 MED ORDER — FUROSEMIDE 20 MG PO TABS
20.0000 mg | ORAL_TABLET | Freq: Every day | ORAL | Status: DC
  Administered 2018-09-20 – 2018-09-22 (×3): 20 mg via ORAL
  Filled 2018-09-20 (×3): qty 1

## 2018-09-20 MED ORDER — APIXABAN 5 MG PO TABS
5.0000 mg | ORAL_TABLET | Freq: Two times a day (BID) | ORAL | Status: DC
  Administered 2018-09-20 – 2018-09-22 (×4): 5 mg via ORAL
  Filled 2018-09-20 (×4): qty 1

## 2018-09-20 MED ORDER — ESCITALOPRAM OXALATE 10 MG PO TABS
20.0000 mg | ORAL_TABLET | Freq: Every day | ORAL | Status: DC
  Administered 2018-09-20 – 2018-09-22 (×3): 20 mg via ORAL
  Filled 2018-09-20 (×3): qty 2

## 2018-09-20 MED ORDER — METOPROLOL TARTRATE 50 MG PO TABS
75.0000 mg | ORAL_TABLET | Freq: Two times a day (BID) | ORAL | Status: DC
  Administered 2018-09-20 – 2018-09-22 (×4): 75 mg via ORAL
  Filled 2018-09-20 (×4): qty 1

## 2018-09-20 MED ORDER — HYDROCODONE-ACETAMINOPHEN 5-325 MG PO TABS
1.0000 | ORAL_TABLET | ORAL | Status: DC | PRN
  Administered 2018-09-21: 1 via ORAL
  Filled 2018-09-20: qty 1

## 2018-09-20 NOTE — Progress Notes (Signed)
[facility] Progress Note  3 Days Post-Op  Subjective: Still requiring some O2. Giving more lasix. Doing well otherwise, having flatus, NG output minimal.   Objective: Vital signs in last 24 hours: Temp:  [97.4 F (36.3 C)-98.9 F (37.2 C)] 98 F (36.7 C) (01/23 1619) Pulse Rate:  [64-118] 96 (01/23 1619) Resp:  [12-24] 24 (01/23 1619) BP: (114-153)/(74-100) 142/98 (01/23 1100) SpO2:  [88 %-98 %] 91 % (01/23 1619) Last BM Date: 09/17/18  Intake/Output from previous day: 01/22 0701 - 01/23 0700 In: 1537.4 [I.V.:1537.4] Out: 1100 [Urine:1100] Intake/Output this shift: Total I/O In: 138.3 [I.V.:138.3] Out: 1450 [Urine:1450]  General appearance: alert, cooperative and no distress Resp: increased work of breathing, O2 in place GI: soft, minimally distended, appropriately tender, wound with packing, clean and intact Extremities: extremities normal, atraumatic, no cyanosis or edema  Lab Results:  Recent Labs    09/19/18 0422 09/20/18 0443  WBC 12.4* 8.0  HGB 8.9* 9.0*  HCT 31.5* 30.2*  PLT 249 244   BMET Recent Labs    09/19/18 0422 09/20/18 0443  NA 138 140  K 3.8 4.4  CL 105 105  CO2 23 28  GLUCOSE 109* 120*  BUN 21 22  CREATININE 0.92 0.90  CALCIUM 7.9* 8.1*   PT/INR No results for input(s): LABPROT, INR in the last 72 hours.  Studies/Results: Dg Chest Port 1 View  Result Date: 09/19/2018 CLINICAL DATA:  Hypoxia EXAM: PORTABLE CHEST 1 VIEW COMPARISON:  09/17/2018 FINDINGS: Cardiac shadow is stable. Gastric catheter is noted extending towards the stomach. The tip is not well visualized. Central vascular congestion and hypoinflation is noted. No bony abnormality is seen. IMPRESSION: Gastric catheter extending towards the stomach. Mild central vascular congestion and poor inspiratory effort. Electronically Signed   By: Inez Catalina M.D.   On: 09/19/2018 10:41    Anti-infectives: Anti-infectives (From admission, onward)   Start      Dose/Rate Route Frequency Ordered Stop   09/18/18 0600  cefoTEtan (CEFOTAN) 2 g in sodium chloride 0.9 % 100 mL IVPB     2 g 200 mL/hr over 30 Minutes Intravenous On call to O.R. 09/17/18 1621 09/18/18 0626   09/18/18 0600  cefoTEtan (CEFOTAN) 2 g in sodium chloride 0.9 % 100 mL IVPB     2 g 200 mL/hr over 30 Minutes Intravenous Every 12 hours 09/17/18 1958 09/19/18 0601   09/17/18 1300  ceFEPIme (MAXIPIME) 2 g in sodium chloride 0.9 % 100 mL IVPB  Status:  Discontinued     2 g 200 mL/hr over 30 Minutes Intravenous Every 8 hours 09/17/18 1249 09/18/18 0940   09/17/18 1245  metroNIDAZOLE (FLAGYL) IVPB 500 mg  Status:  Discontinued     500 mg 100 mL/hr over 60 Minutes Intravenous Every 8 hours 09/17/18 1233 09/17/18 1958      Assessment/Plan: Mr. Casher is a 63 yo POD 3 s/p Ex lap, SBR for small bowel perforation related to enteritis Tresa Garter. Doing fair.  PRN for pain IS, OOB, ambulate HD w/ A fib, rate control better on IV metoprolol, adding back home oral meds  NG out, clears today Will get GI follow up as outpatient to determine any concern for IBD  Leukocytosis down, monitor  Resolving sepsis related to intraabdominal abscess SCDs, will start back Eliquis, H&H in AM    LOS: 3 days    Virl Cagey 09/20/2018

## 2018-09-20 NOTE — Progress Notes (Signed)
RN informed RT that patient requested to come off CPAP 30 minutes after he was put on due to mask being too uncomfortable with NG tube and machine alarming from leak. Patient was placed back on HFNC by RN.

## 2018-09-21 ENCOUNTER — Inpatient Hospital Stay (HOSPITAL_COMMUNITY): Payer: 59

## 2018-09-21 LAB — TYPE AND SCREEN
ABO/RH(D): B POS
Antibody Screen: NEGATIVE
Unit division: 0
Unit division: 0

## 2018-09-21 LAB — BPAM RBC
Blood Product Expiration Date: 202002222359
Blood Product Expiration Date: 202002222359
Unit Type and Rh: 5100
Unit Type and Rh: 5100

## 2018-09-21 MED ORDER — LISINOPRIL 5 MG PO TABS
5.0000 mg | ORAL_TABLET | Freq: Every day | ORAL | Status: DC
  Administered 2018-09-22: 5 mg via ORAL
  Filled 2018-09-21: qty 1

## 2018-09-21 MED ORDER — FUROSEMIDE 10 MG/ML IJ SOLN
20.0000 mg | Freq: Once | INTRAMUSCULAR | Status: AC
  Administered 2018-09-21: 20 mg via INTRAVENOUS
  Filled 2018-09-21: qty 2

## 2018-09-21 NOTE — Progress Notes (Signed)
[facility] Progress Note  4 Days Post-Op  Subjective: Weaned off O2. Feeling better. Ambulated. Tolerating diet and had BMs. More lasix this AM after CXR with congestion.  Objective: Vital signs in last 24 hours: Temp:  [98 F (36.7 C)-98.3 F (36.8 C)] 98.3 F (36.8 C) (01/24 0800) Pulse Rate:  [65-134] 75 (01/24 1137) Resp:  [11-24] 15 (01/24 1137) BP: (139-154)/(80-95) 154/95 (01/24 1100) SpO2:  [89 %-98 %] 93 % (01/24 1452) Last BM Date: 09/21/18  Intake/Output from previous day: 01/23 0701 - 01/24 0700 In: 545 [I.V.:545] Out: 1650 [Urine:1650] Intake/Output this shift: Total I/O In: 895.2 [I.V.:895.2] Out: 500 [Urine:500]  General appearance: alert, cooperative and no distress Resp: normal work breathing GI: soft, dressing in place  Lab Results:  Recent Labs    09/19/18 0422 09/20/18 0443  WBC 12.4* 8.0  HGB 8.9* 9.0*  HCT 31.5* 30.2*  PLT 249 244   BMET Recent Labs    09/19/18 0422 09/20/18 0443  NA 138 140  K 3.8 4.4  CL 105 105  CO2 23 28  GLUCOSE 109* 120*  BUN 21 22  CREATININE 0.92 0.90  CALCIUM 7.9* 8.1*    Studies/Results: Dg Chest Port 1 View  Result Date: 09/21/2018 CLINICAL DATA:  Shortness of breath. EXAM: PORTABLE CHEST 1 VIEW COMPARISON:  09/19/2018 FINDINGS: Interval removal of orogastric tube. Low volumes and interstitial coarsening which is likely vascular congestion. No effusion or pneumothorax. Normal heart size. Asymmetric elevation of the right diaphragm. IMPRESSION: Stable vascular congestion and low lung volumes. Electronically Signed   By: Monte Fantasia M.D.   On: 09/21/2018 07:00    Anti-infectives: Anti-infectives (From admission, onward)   Start     Dose/Rate Route Frequency Ordered Stop   09/18/18 0600  cefoTEtan (CEFOTAN) 2 g in sodium chloride 0.9 % 100 mL IVPB     2 g 200 mL/hr over 30 Minutes Intravenous On call to O.R. 09/17/18 1621 09/18/18 0626   09/18/18 0600  cefoTEtan (CEFOTAN) 2 g in  sodium chloride 0.9 % 100 mL IVPB     2 g 200 mL/hr over 30 Minutes Intravenous Every 12 hours 09/17/18 1958 09/19/18 0601   09/17/18 1300  ceFEPIme (MAXIPIME) 2 g in sodium chloride 0.9 % 100 mL IVPB  Status:  Discontinued     2 g 200 mL/hr over 30 Minutes Intravenous Every 8 hours 09/17/18 1249 09/18/18 0940   09/17/18 1245  metroNIDAZOLE (FLAGYL) IVPB 500 mg  Status:  Discontinued     500 mg 100 mL/hr over 60 Minutes Intravenous Every 8 hours 09/17/18 1233 09/17/18 1958      Assessment/Plan: Mr. Trageser is a  63 yo POD 4 s/p Ex lap, SBR for small bowel perforation related to enteritis Tresa Garter.  PRN for pain Ambulate, IS Lasix Home meds Soft diet Dressing BID Leukocytosis resolved  SCDs, Eliquis, H&H stable DC IVF, lytes good Home likely in next 2 days   LOS: 4 days    Virl Cagey 09/21/2018

## 2018-09-21 NOTE — Care Management Note (Signed)
Case Management Note  Patient Details  Name: Shaun [name] MRN: [id number] Date of Birth: 08/29/1956  Subjective/Objective:                  3 s/p Ex lap, SBR for small bowel perforation related to enteritis Tresa Garter. From home. Independent. Works Full time. Up to chair currently on room air.   Action/Plan: Anticipate no needs.   Expected Discharge Date:     unk             Expected Discharge Plan:  Home/Self Care  In-House Referral:     Discharge planning Services  CM Consult  Post Acute Care Choice:  NA Choice offered to:  NA  DME Arranged:    DME Agency:     HH Arranged:    HH Agency:     Status of Service:  Completed, signed off  If discussed at [employer] of Stay Meetings, dates discussed:    Additional Comments:  Samaya Boardley, Chauncey Reading, RN 09/21/2018, 11:24 AM

## 2018-09-22 MED ORDER — DOCUSATE SODIUM 100 MG PO CAPS: 100.0000 mg | ORAL_CAPSULE | Freq: Two times a day (BID) | ORAL | 2 refills | Status: DC

## 2018-09-22 MED ORDER — HYDROCODONE-ACETAMINOPHEN 5-325 MG PO TABS: 1.0000 | ORAL_TABLET | ORAL | 0 refills | Status: DC | PRN

## 2018-09-22 MED ORDER — ONDANSETRON 4 MG PO TBDP: 4.0000 mg | ORAL_TABLET | Freq: Four times a day (QID) | ORAL | 0 refills | Status: DC | PRN

## 2018-09-22 NOTE — Discharge Instructions (Signed)
Discharge Open Abdominal Surgery Instructions:  Common Complaints: Pain at the incision site is common. This will improve with time. Take your pain medications as described below. Some nausea is common and poor appetite. The main goal is to stay hydrated the first few days after surgery.   Diet/ Activity: Diet as tolerated. You have started and tolerated a diet in the hospital, and should continue to increase what you are able to eat.   You may not have a large appetite, but it is important to stay hydrated. Drink 64 ounces of water a day. Your appetite will return with time.  Pack your wound with damp to dry gauze daily to twice daily as shown.  Cover with a pad and tape.   Shower per your regular routine daily.  Keep the incision covered and replace the packing and dressing after the shower.   Pat the wound dry after showering and before replacing the gauze.  Wear an abdominal binder daily with activity. You do not have to wear this while sleeping or sitting.  Rest and listen to your body, but do not remain in bed all day.  Walk everyday for at least 15-20 minutes. Deep cough and move around every 1-2 hours in the first few days after surgery.  Do not lift > 10 lbs, perform excessive bending, pushing, pulling, squatting for 6-8 weeks after surgery.  The activity restrictions and the abdominal binder are to prevent hernia formation at your incision while you are healing.  Do not place lotions or balms on your incision unless instructed to specifically by Dr. Constance Haw.   Medication: Take tylenol and ibuprofen as needed for pain control, alternating every 4-6 hours.  Example:  Tylenol 1000mg  @ 6am, 12noon, 6pm, 14midnight (Do not exceed 4000mg  of tylenol a day). Ibuprofen 800mg  @ 9am, 3pm, 9pm, 3am (Do not exceed 3600mg  of ibuprofen a day).  Take your Norco for breakthrough pain every 4 hours. Your Norco has 325 mg of Tylenol in it, so if you take Tylenol in addition, do not exceed 4000mg )    Take Colace for constipation related to narcotic pain medication. If you do not have a bowel movement in 2 days, take Miralax over the counter.  Drink plenty of water to also prevent constipation.   Contact Information: If you have questions or concerns, please call our office, [phone], Monday- Thursday 8AM-5PM and Friday 8AM-12Noon.  If it is after hours or on the weekend, please call [facility], [phone], and ask to speak to the surgeon on call for Dr. Constance Haw at [facility].    Open Small Bowel Resection, Care After This sheet gives you information about how to care for yourself after your procedure. Your health care provider may also give you more specific instructions. If you have problems or questions, contact your health care provider. What can I expect after the procedure? After the procedure, it is common to have:  Pain in your abdomen, especially along your incision. You will be given pain medicines to control this.  Tiredness. This is a normal part of the recovery process. Your energy level will return to normal over the next several weeks.  Constipation. You may be given a stool softener to help prevent this. Follow these instructions at home: Medicines  Take over-the-counter and prescription medicines only as told by your health care provider.  Do not drive or use heavy machinery while taking prescription pain medicine.  If you were prescribed an antibiotic medicine, use it as told  by your health care provider. Do not stop using the antibiotic even if you start to feel better. Activity  Return to your normal activities as told by your health care provider. Ask your health care provider what activities are safe for you.  Do not lift anything that is heavier than 10 lb (4.5 kg) until your health care provider says that it is safe.  Take frequent rest breaks during the day as needed.  Try to take short walks every day for the amount of time that your  health care provider suggests.  Avoid activities that require a lot of effort (are strenuous) for as long as told by your health care provider. Incision care   Follow instructions from your health care provider about how to take care of your incision. Make sure you: ? Wash your hands with soap and water before you change your bandage (dressing). If soap and water are not available, use hand sanitizer. ? Change your dressing as told by your health care provider.  Check your incision area every day for signs of infection. Check for: ? More redness, swelling, or pain. ? More fluid or blood. ? Warmth. ? Pus or a bad smell.  Do not take baths, swim, or use a hot tub until your health care provider approves. Ask your health care provider if you can take showers. You may only be allowed to take sponge baths for bathing. General instructions  Continue to practice deep breathing and coughing. If it hurts to cough, try holding a pillow against your abdomen as you cough.  To prevent or treat constipation while you are taking prescription pain medicine, your health care provider may recommend that you: ? Drink enough fluid to keep your urine clear or pale yellow. ? Take over-the-counter or prescription medicines. ? Eat foods that are high in fiber, such as fresh fruits and vegetables, whole grains, and beans. ? Limit foods that are high in fat and processed sugars, such as fried and sweet foods.  Do not use any products that contain nicotine or tobacco as told by your health care provider. These include cigarettes and e-cigarettes. If you need help quitting, ask your health care provider.  Keep all follow-up visits as told by your health care provider. This is important. Contact a health care provider if:  You have pain that is not relieved with medicine.  You do not feel like eating.  You feel nauseous or you vomit.  You have constipation that is not relieved with prescribed stool  softeners.  You have more redness, swelling, or pain around your incision.  You have more fluid or blood coming from your incision.  Your incision feels warm to the touch.  You have pus or a bad smell coming from your incision.  You have a fever. Get help right away if:  Your pain gets worse, even after you take pain medicine.  Your legs or arms hurt or become red or swollen.  You have chest pain.  You have trouble breathing. This information is not intended to replace advice given to you by your health care provider. Make sure you discuss any questions you have with your health care provider. Document Released: 01/17/2011 Document Revised: 05/24/2016 Document Reviewed: 05/16/2016 Elsevier Interactive Patient Education  [employer].

## 2018-09-22 NOTE — Discharge Summary (Signed)
Physician Discharge Summary  Patient ID: Shaun [name] MRN: [id number] DOB/AGE: Jan 07, 1956 63 y.o.  Admit date: 09/17/2018 Discharge date: 09/22/2018  Admission Diagnoses: Pneumoperitoneum   Discharge Diagnoses:  Active Problems:   Small bowel perforation [facility])   Discharged Condition: Good   Hospital Course: [name] is 63 yo who presented with abdominal pain, nausea/vomiting and findings of small bowel perforation with abscess. He was very tender on exam and was taken to the OR for exploration and small bowel resection. The pathology came back with inflammation but no signs of malignancy or foreign body.  The area was more jejunal in nature and he had no prior evidence or other signs of anything like Crohn's disease.   Post operatively, he did well and was transitioned from an NG to a clear diet to a soft diet. He was having bowel function, ambulating, and urinating after surgery prior to going home.  He was packing the wound due to the purulent contamination.  During his stay, he received some lasix due to desaturations and need for oxygen.  As he diuresed, the oxygen came off.   Consults: None   Significant Diagnostic Studies: CT a/p - perforated small bowl with abscess   Treatments: Ex lap, SBR with primary anastomosis   Discharge Exam: Blood pressure 139/84, pulse 63, temperature 97.6 F (36.4 C), temperature source Oral, resp. rate (!) 21, height 5' 8"  (1.727 m), weight 103.3 kg, SpO2 94 %. General appearance: alert, cooperative and no distress Resp: normal work of breathing GI: soft, minimally distended, appropriately tender, wound packed with beefy pink tissue at base, no drainage or erythema Extremities: extremities normal, atraumatic, no cyanosis or edema  Disposition:    Discharge Instructions    Call MD for:  difficulty breathing, headache or visual disturbances   Complete by:  As directed    Call MD for:  extreme fatigue   Complete by:  As directed    Call  MD for:  persistant dizziness or light-headedness   Complete by:  As directed    Call MD for:  persistant nausea and vomiting   Complete by:  As directed    Call MD for:  redness, tenderness, or signs of infection (pain, swelling, redness, odor or green/yellow discharge around incision site)   Complete by:  As directed    Call MD for:  severe uncontrolled pain   Complete by:  As directed    Call MD for:  temperature >100.4   Complete by:  As directed    Diet - low sodium heart healthy   Complete by:  As directed    Driving Restrictions   Complete by:  As directed    No driving until seen by Dr. Constance [name] in clinic   Increase activity slowly   Complete by:  As directed      Allergies as of 09/22/2018   No Known Allergies     Medication List    STOP taking these medications   oseltamivir 75 MG capsule Commonly known as:  TAMIFLU     TAKE these medications   apixaban 5 MG Tabs tablet Commonly known as:  ELIQUIS Take 1 tablet (5 mg total) by mouth 2 (two) times daily.   atorvastatin 80 MG tablet Commonly known as:  LIPITOR Take 1 tablet (80 mg total) by mouth daily.   azithromycin 250 MG tablet Commonly known as:  ZITHROMAX Z-PAK Take 2 tablets (500 mg) on  Day 1,  followed by 1 tablet (250 mg) once daily  on Days 2 through 5.   buPROPion 150 MG 12 hr tablet Commonly known as:  WELLBUTRIN SR Take one each morning for 3 days then take one twice daily What changed:    how much to take  how to take this  when to take this  additional instructions   docusate sodium 100 MG capsule Commonly known as:  COLACE Take 1 capsule (100 mg total) by mouth 2 (two) times daily.   escitalopram 20 MG tablet Commonly known as:  LEXAPRO TAKE 1 TABLET(20 MG) BY MOUTH DAILY What changed:  See the new instructions.   furosemide 20 MG tablet Commonly known as:  LASIX TAKE 1 TABLET BY MOUTH EVERY DAY   HYDROcodone-acetaminophen 5-325 MG tablet Commonly known as:   NORCO/VICODIN Take 1-2 tablets by mouth every 4 (four) hours as needed for severe pain. What changed:    how much to take  how to take this  when to take this  reasons to take this  additional instructions   HYDROcodone-homatropine 5-1.5 MG/5ML syrup Commonly known as:  HYCODAN Take 5 mLs by mouth every 6 (six) hours as needed for cough.   iron polysaccharides 150 MG capsule Commonly known as:  NU-IRON 1 PO BID FOR 3 MOS   lisinopril 5 MG tablet Commonly known as:  PRINIVIL,ZESTRIL TAKE 1 TABLET BY MOUTH EVERY DAY   meclizine 25 MG tablet Commonly known as:  ANTIVERT Take 1 tablet (25 mg total) by mouth 3 (three) times daily as needed for dizziness.   metFORMIN 500 MG tablet Commonly known as:  GLUCOPHAGE Take 1 tablet (500 mg total) by mouth 2 (two) times daily with a meal.   metoprolol tartrate 50 MG tablet Commonly known as:  LOPRESSOR TAKE 1 AND 1/2 TABLETS BY MOUTH TWICE DAILY   Na Sulfate-K Sulfate-Mg Sulf 17.5-3.13-1.6 GM/177ML Soln Take 1 kit by mouth as directed.   nitroGLYCERIN 0.4 MG SL tablet Commonly known as:  NITROSTAT Place 1 tablet (0.4 mg total) under the tongue every 5 (five) minutes as needed. What changed:  reasons to take this   omeprazole 40 MG capsule Commonly known as:  PRILOSEC Take 1 capsule (40 mg total) by mouth daily.   ondansetron 4 MG disintegrating tablet Commonly known as:  ZOFRAN-ODT Take 1 tablet (4 mg total) by mouth every 6 (six) hours as needed for nausea. What changed:    how much to take  how to take this  when to take this  reasons to take this  additional instructions   potassium chloride SA 20 MEQ tablet Commonly known as:  K-DUR,KLOR-CON TAKE 1 TABLET BY MOUTH EVERY DAY   ranitidine 300 MG tablet Commonly known as:  ZANTAC Take 1 tablet (300 mg total) by mouth daily. WHEN NEEDED TO CONTROL HEARTBURN.   rOPINIRole 3 MG tablet Commonly known as:  REQUIP Take 1 tablet (3 mg total) by mouth at  bedtime.   sildenafil 100 MG tablet Commonly known as:  VIAGRA TAKE 1 TABLET BY MOUTH EVERY DAY AS NEEDED FOR ERECTILE DYSFUNCTION   sodium chloride 0.65 % Soln nasal spray Commonly known as:  OCEAN Place 1 spray into both nostrils daily as needed for congestion.   traZODone 50 MG tablet Commonly known as:  DESYREL TAKE 2 TABLETS(100 MG) BY MOUTH AT BEDTIME What changed:  See the new instructions.   triamcinolone cream 0.1 % Commonly known as:  KENALOG Apply 1 application topically 2 (two) times daily. What changed:    when to take  this  reasons to take this   VENTOLIN HFA 108 (90 Base) MCG/ACT inhaler Generic drug:  albuterol INHALE 2 PUFFS INTO THE LUNGS EVERY 6 HOURS AS NEEDED FOR WHEEZING OR SHORTNESS OF BREATH What changed:  See the new instructions.   VISINE 0.05 % ophthalmic solution Generic drug:  tetrahydrozoline Place 2 drops into both eyes daily as needed (for dry eyes).      Follow-up Information    Virl Cagey, MD Follow up on 09/27/2018.   Specialty:  General Surgery Contact information: [address] [place] [address] [phone]           Signed: Virl Cagey 09/22/2018, 5:33 PM

## 2018-09-23 LAB — CULTURE, BLOOD (ROUTINE X 2)
CULTURE: NO GROWTH
Culture: NO GROWTH
Special Requests: ADEQUATE
Special Requests: ADEQUATE

## 2018-09-27 ENCOUNTER — Ambulatory Visit (INDEPENDENT_AMBULATORY_CARE_PROVIDER_SITE_OTHER): Payer: Self-pay | Admitting: General Surgery

## 2018-09-27 ENCOUNTER — Encounter: Payer: Self-pay | Admitting: General Surgery

## 2018-09-27 VITALS — BP 105/62 | HR 64 | Temp 97.1°F | Resp 20 | Wt 226.8 lb

## 2018-09-27 DIAGNOSIS — K631 Perforation of intestine (nontraumatic): Secondary | ICD-10-CM

## 2018-09-27 DIAGNOSIS — N471 Phimosis: Secondary | ICD-10-CM

## 2018-09-27 NOTE — Patient Instructions (Addendum)
Do not lift > 10 lbs, perform excessive bending, pushing, pulling, squatting for 6-8 weeks after surgery.  The activity restrictions and the abdominal binder are to prevent hernia formation at your incision while you are healing.  Continue daily dressing changes as you are doing.  Diet as tolerated. Keep stools soft. Wife to go back to work on Wednesday, October 03, 2018.

## 2018-09-27 NOTE — Progress Notes (Signed)
[facility] Note   HPI:  63 y.o. Male presents to clinic for post-op follow-up evaluation after small bowel resection for perforation with abscess. He has been doing well. No fever or chills. Moving more but sleeping in the chair. He has been tolerating his diet and having BMs. Wound packing is going well.   Review of Systems:  NO fever or chills NO drainage from wound All other review of systems: otherwise negative   Vital Signs:  BP 105/62 (BP Location: Left Arm, Patient Position: Sitting, Cuff Size: Large)   Pulse 64   Temp (!) 97.1 F (36.2 C) (Temporal)   Resp 20   Wt 226 lb 12.8 oz (102.9 kg)   BMI 34.48 kg/m    Physical Exam:  Physical Exam Vitals signs reviewed.  Constitutional:      Appearance: Normal appearance.  Neck:     Musculoskeletal: Normal range of motion.  Cardiovascular:     Rate and Rhythm: Normal rate.  Pulmonary:     Effort: Pulmonary effort is normal.  Abdominal:     General: There is no distension.     Palpations: Abdomen is soft.     Tenderness: There is no abdominal tenderness.     Comments: Midline repacked, great granulation tissue, no erythema or drainage, staples removed and steri placed to the mid portion of the wound  Neurological:     Mental Status: He is alert.     Assessment:  63 y.o. yo Male s/p Ex lap and SBR for perforation with abscess. No history of any Crohn's or IBD, unsure of etiology other than enteritis. Will make Neil Crouch aware of this for future appointments with GI.   Plan:  Do not lift > 10 lbs, perform excessive bending, pushing, pulling, squatting for 6-8 weeks after surgery.  The activity restrictions and the abdominal binder are to prevent hernia formation at your incision while you are healing.  Continue daily dressing changes as you are doing.  Diet as tolerated. Keep stools soft. Wife to go back to work on Wednesday, October 03, 2018.   All of the above recommendations were discussed  with the patient and patient's family, and all of patient's and family's questions were answered to their expressed satisfaction.  Future Appointments  Date Time Provider [place]  10/11/2018  1:30 PM Virl Cagey, MD [facility] None  10/17/2018  2:30 PM Mahala Menghini, PA-C [facility] [place], MD [facility] [address] [place], [place] [address] [phone] (office)

## 2018-09-28 ENCOUNTER — Encounter: Payer: Self-pay | Admitting: Gastroenterology

## 2018-10-01 ENCOUNTER — Other Ambulatory Visit: Payer: Self-pay | Admitting: Cardiology

## 2018-10-09 ENCOUNTER — Ambulatory Visit: Payer: 59 | Admitting: Family Medicine

## 2018-10-09 ENCOUNTER — Encounter: Payer: Self-pay | Admitting: Family Medicine

## 2018-10-09 VITALS — BP 114/68 | Ht 68.0 in | Wt 223.0 lb

## 2018-10-09 DIAGNOSIS — I10 Essential (primary) hypertension: Secondary | ICD-10-CM | POA: Diagnosis not present

## 2018-10-09 DIAGNOSIS — E119 Type 2 diabetes mellitus without complications: Secondary | ICD-10-CM | POA: Diagnosis not present

## 2018-10-09 DIAGNOSIS — Z79891 Long term (current) use of opiate analgesic: Secondary | ICD-10-CM

## 2018-10-09 LAB — POCT GLYCOSYLATED HEMOGLOBIN (HGB A1C): Hemoglobin A1C: 5.5 % (ref 4.0–5.6)

## 2018-10-09 MED ORDER — HYDROCODONE-ACETAMINOPHEN 5-325 MG PO TABS: 1.0000 | ORAL_TABLET | ORAL | 0 refills | Status: DC | PRN

## 2018-10-09 MED ORDER — HYDROCODONE-ACETAMINOPHEN 5-325 MG PO TABS: ORAL_TABLET | ORAL | 0 refills | Status: DC

## 2018-10-09 NOTE — Progress Notes (Signed)
Subjective:    Patient ID: Shaun [name], male    DOB: 1955/09/23, 63 y.o.   MRN: [id number]  HPI This patient was seen today for chronic pain  Takes hydrocodone 5/325 one bid for right hip pain.   The medication list was reviewed and updated.   -Compliance with medication: takes two daily  - Number patient states they take daily: 2  -when was the last dose patient took? today  The patient was advised the importance of maintaining medication and not using illegal substances with these.  Here for refills and follow up  The patient was educated that we can provide 3 monthly scripts for their medication, it is their responsibility to follow the instructions.  Side effects or complications from medications: none  Patient is aware that pain medications are meant to minimize the severity of the pain to allow their pain levels to improve to allow for better function. They are aware of that pain medications cannot totally remove their pain.  Due for UDT ( at least once per year) : 04/2017. One done today.   Results for orders placed or performed in visit on 10/09/18  POCT glycosylated hemoglobin (Hb A1C)  Result Value Ref Range   Hemoglobin A1C 5.5 4.0 - 5.6 %   HbA1c POC (<> result, manual entry)     HbA1c, POC (prediabetic range)     HbA1c, POC (controlled diabetic range)     Pt seems like recovering form the surgery, still drainin pt wa hoing it would uit by now  Following up with the [facility] folks on the  19th, due to se dr bridges  thurs   Patient claims compliance with diabetes medication. No obvious side effects. Reports no substantial low sugar spells. Most numbers are generally in good range when checked fasting. Generally does not miss a dose of medication. Watching diabetic diet closely   Blood pressure medicine and blood pressure levels reviewed today with patient. Compliant with blood pressure medicine. States does not miss a dose. No obvious side effects. Blood  pressure generally good when checked elsewhere. Watching salt intake.    Blood pressure medicine and blood pressure levels reviewed today with patient. Compliant with blood pressure medicine. States does not miss a dose. No obvious side effects. Blood pressure generally good when checked elsewhere. Watching salt intake.   Impression: Chronic pain. Patient compliant with medication. No substantial side effects. [place] controlled substance registry reviewed to ensure compliance and proper use of medication. Patient aware goal of medicine is not complete resolution of pain but to control his symptoms to improve his functional capacity. Aware of potential adverse side effects             Review of Systems No headache, no major weight loss or weight gain, no chest pain no change in bowel habits complete ROS otherwise negative     Objective:   Physical Exam  Alert and oriented, vitals reviewed and stable, NAD ENT-TM's and ext canals WNL bilat via otoscopic exam Soft palate, tonsils and post pharynx WNL via oropharyngeal exam Neck-symmetric, no masses; thyroid nonpalpable and nontender Pulmonary-no tachypnea or accessory muscle use; Clear without wheezes via auscultation Card--no abnrml murmurs, rhythm reg and rate WNL Carotid pulses symmetric, without bruits       Assessment & Plan:  Impression #1 type 2 diabetes.  Tight control discussed to maintain same meds  2.  Hypertension.  Control good maintain same meds  3.  Status post hospitalization for  bowel obstruction secondary to enteritis.  Patient having slow improvement of energy.  Still has draining and wound currently followed by specialist.  GI visit to next week.  4.  Chronic pain.  Ongoing need for refills on medications  5.  Disability question.  Patient wonders with all his medical concerns if he would qualify for disability.  He had serious potentially yes however this is very much of bureaucratic  determination he states.  Discussed with patient  Follow-up in several months

## 2018-10-11 ENCOUNTER — Telehealth: Payer: Self-pay | Admitting: *Deleted

## 2018-10-11 ENCOUNTER — Other Ambulatory Visit: Payer: Self-pay | Admitting: *Deleted

## 2018-10-11 ENCOUNTER — Ambulatory Visit (INDEPENDENT_AMBULATORY_CARE_PROVIDER_SITE_OTHER): Payer: Self-pay | Admitting: General Surgery

## 2018-10-11 ENCOUNTER — Encounter: Payer: Self-pay | Admitting: General Surgery

## 2018-10-11 VITALS — BP 105/66 | HR 86 | Temp 98.7°F | Resp 18 | Wt 226.8 lb

## 2018-10-11 DIAGNOSIS — N471 Phimosis: Secondary | ICD-10-CM

## 2018-10-11 DIAGNOSIS — K631 Perforation of intestine (nontraumatic): Secondary | ICD-10-CM

## 2018-10-11 MED ORDER — HYDROCODONE-ACETAMINOPHEN 5-325 MG PO TABS: ORAL_TABLET | ORAL | 0 refills | Status: DC

## 2018-10-11 MED ORDER — TAMSULOSIN HCL 0.4 MG PO CAPS: 0.4000 mg | ORAL_CAPSULE | Freq: Every day | ORAL | 2 refills | Status: DC

## 2018-10-11 NOTE — Progress Notes (Signed)
[facility] Note   HPI:  63 y.o. Male presents to clinic for a wound check. He has been doing well. He is eating and having Bms. He is feeling stronger each day. He is complaining of some urinary dribbling and getting up frequently at night. No burning or irritation.   Review of Systems:  No fever or chills Wound not draining  Some constipation at times  All other review of systems: otherwise negative   Vital Signs:  BP 105/66 (BP Location: Left Arm, Patient Position: Sitting, Cuff Size: Normal)   Pulse 86   Temp 98.7 F (37.1 C) (Temporal)   Resp 18   Wt 226 lb 12.8 oz (102.9 kg)   BMI 34.48 kg/m    Physical Exam:  Physical Exam Vitals signs reviewed.  HENT:     Head: Normocephalic.  Cardiovascular:     Rate and Rhythm: Normal rate.  Pulmonary:     Effort: Pulmonary effort is normal.  Abdominal:     General: There is no distension.     Tenderness: There is no abdominal tenderness.     Comments: Wound with great granulation, healing and shrinking, no erythema or drainage, repacked with saline dampened gauze   Neurological:     Mental Status: He is alert.     Assessment:  63 y.o. yo Male s/p Ex lap and SBR for a small bowel perforation. Wound is healing with wet to dry dressings. He is doing well overall.   Plan:  Continue wound packing daily and can transition to a dry dressing over the wound as the wound becomes more shallow.  Diet as tolerated. Metamucil/ benefiber for constipation. Miralax if needed if no BM in 2 days.  Flomax for BPH symptoms, still needs urology follow up for Phimosis, and not for BPH symptoms, Our staff are looking into this referral  Future Appointments  Date Time Provider [place]  10/17/2018  2:30 PM Mahala Menghini, PA-C [facility]  10/25/2018  1:00 PM Virl Cagey, MD [facility] None  01/17/2019 10:00 AM Mikey Kirschner, MD [facility] [place]     All of the above recommendations were discussed with the patient  and patient's family, and all of patient's and family's questions were answered to their expressed satisfaction.  Curlene Labrum, MD [facility] [address] [place], [place] [address] [phone] (office)

## 2018-10-11 NOTE — Telephone Encounter (Signed)
Call from [facility] [address]  3 scripts of hydrocodone 5/325 sent in with 3 different directions  First one had take one to two q 4 prn pain and take one bid prn pain #45  2nd script with directions of one bid pain and the 3rd script had no directions just may fill 90 days ( sorry Dr. Richardson Landry I sent these in wrong) pharm canceled all three scripts and I pended three new scripts to be signed.

## 2018-10-11 NOTE — Patient Instructions (Addendum)
Continue wound packing daily and can transition to a dry dressing over the wound as the wound becomes more shallow.  Diet as tolerated. Metamucil/ benefiber for constipation. Miralax if needed if no BM in 2 days.   Tamsulosin capsules What is this medicine? TAMSULOSIN (tam SOO loe sin) is an alpha blocker. It is used to treat the signs and symptoms of an enlarged prostate in men. This condition is also called benign prostatic hyperplasia (BPH). This medicine may be used for other purposes; ask your health care provider or pharmacist if you have questions. COMMON BRAND NAME(S): Flomax What should I tell my health care provider before I take this medicine? They need to know if you have any of the following conditions: -advanced kidney disease -advanced liver disease -low blood pressure -prostate cancer -an unusual or allergic reaction to tamsulosin, sulfa drugs, other medicines, foods, dyes, or preservatives -pregnant or trying to get pregnant -breast-feeding How should I use this medicine? Take this medicine by mouth about 30 minutes after the same meal every day. Follow the directions on the prescription label. Swallow the capsules whole with a glass of water. Do not crush, chew, or open capsules. Do not take your medicine more often than directed. Do not stop taking your medicine unless your doctor tells you to. Talk to your pediatrician regarding the use of this medicine in children. Special care may be needed. Overdosage: If you think you have taken too much of this medicine contact a poison control center or emergency room at once. NOTE: This medicine is only for you. Do not share this medicine with others. What if I miss a dose? If you miss a dose, take it as soon as you can. If it is almost time for your next dose, take only that dose. Do not take double or extra doses. If you stop taking your medicine for several days or more, ask your doctor or health care professional what dose you  should start back on. What may interact with this medicine? -cimetidine -fluoxetine -ketoconazole -medicines for erectile disfunction like sildenafil, tadalafil, vardenafil -medicines for high blood pressure -other alpha-blockers like alfuzosin, doxazosin, phentolamine, phenoxybenzamine, prazosin, terazosin -warfarin This list may not describe all possible interactions. Give your health care provider a list of all the medicines, herbs, non-prescription drugs, or dietary supplements you use. Also tell them if you smoke, drink alcohol, or use illegal drugs. Some items may interact with your medicine. What should I watch for while using this medicine? Visit your doctor or health care professional for regular check ups. You will need lab work done before you start this medicine and regularly while you are taking it. Check your blood pressure as directed. Ask your health care professional what your blood pressure should be, and when you should contact him or her. This medicine may make you feel dizzy or lightheaded. This is more likely to happen after the first dose, after an increase in dose, or during hot weather or exercise. Drinking alcohol and taking some medicines can make this worse. Do not drive, use machinery, or do anything that needs mental alertness until you know how this medicine affects you. Do not sit or stand up quickly. If you begin to feel dizzy, sit down until you feel better. These effects can decrease once your body adjusts to the medicine. Contact your doctor or health care professional right away if you have an erection that lasts longer than 4 hours or if it becomes painful. This may be a  sign of a serious problem and must be treated right away to prevent permanent damage. If you are thinking of having cataract surgery, tell your eye surgeon that you have taken this medicine. What side effects may I notice from receiving this medicine? Side effects that you should report to your  doctor or health care professional as soon as possible: -allergic reactions like skin rash or itching, hives, swelling of the lips, mouth, tongue, or throat -breathing problems -change in vision -feeling faint or lightheaded -irregular heartbeat -prolonged or painful erection -weakness Side effects that usually do not require medical attention (report to your doctor or health care professional if they continue or are bothersome): -back pain -change in sex drive or performance -constipation, nausea or vomiting -cough -drowsy -runny or stuffy nose -trouble sleeping This list may not describe all possible side effects. Call your doctor for medical advice about side effects. You may report side effects to FDA at 1-800-FDA-1088. Where should I keep my medicine? Keep out of the reach of children. Store at room temperature between 15 and 30 degrees C (59 and 86 degrees F). Throw away any unused medicine after the expiration date. NOTE: This sheet is a summary. It may not cover all possible information. If you have questions about this medicine, talk to your doctor, pharmacist, or health care provider.  2019 Elsevier/Gold Standard (2018-01-18 12:54:06) Benign Prostatic Hyperplasia  Benign prostatic hyperplasia (BPH) is an enlarged prostate gland that is caused by the normal aging process and not by cancer. The prostate is a walnut-sized gland that is involved in the production of semen. It is located in front of the rectum and below the bladder. The bladder stores urine and the urethra is the tube that carries the urine out of the body. The prostate may get bigger as a man gets older. An enlarged prostate can press on the urethra. This can make it harder to pass urine. The build-up of urine in the bladder can cause infection. Back pressure and infection may progress to bladder damage and kidney (renal) failure. What are the causes? This condition is part of a normal aging process. However, not  all men develop problems from this condition. If the prostate enlarges away from the urethra, urine flow will not be blocked. If it enlarges toward the urethra and compresses it, there will be problems passing urine. What increases the risk? This condition is more likely to develop in men over the age of 39 years. What are the signs or symptoms? Symptoms of this condition include:  Getting up often during the night to urinate.  Needing to urinate frequently during the day.  Difficulty starting urine flow.  Decrease in size and strength of your urine stream.  Leaking (dribbling) after urinating.  Inability to pass urine. This needs immediate treatment.  Inability to completely empty your bladder.  Pain when you pass urine. This is more common if there is also an infection.  Urinary tract infection (UTI). How is this diagnosed? This condition is diagnosed based on your medical history, a physical exam, and your symptoms. Tests will also be done, such as:  A post-void bladder scan. This measures any amount of urine that may remain in your bladder after you finish urinating.  A digital rectal exam. In a rectal exam, your health care provider checks your prostate by putting a lubricated, gloved finger into your rectum to feel the back of your prostate gland. This exam detects the size of your gland and any abnormal  lumps or growths.  An exam of your urine (urinalysis).  A prostate specific antigen (PSA) screening. This is a blood test used to screen for prostate cancer.  An ultrasound. This test uses sound waves to electronically produce a picture of your prostate gland. Your health care provider may refer you to a specialist in kidney and prostate diseases (urologist). How is this treated? Once symptoms begin, your health care provider will monitor your condition (active surveillance or watchful waiting). Treatment for this condition will depend on the severity of your condition.  Treatment may include:  Observation and yearly exams. This may be the only treatment needed if your condition and symptoms are mild.  Medicines to relieve your symptoms, including: ? Medicines to shrink the prostate. ? Medicines to relax the muscle of the prostate.  Surgery in severe cases. Surgery may include: ? Prostatectomy. In this procedure, the prostate tissue is removed completely through an open incision or with a laparascope or robotics. ? Transurethral resection of the prostate (TURP). In this procedure, a tool is inserted through the opening at the tip of the penis (urethra). It is used to cut away tissue of the inner core of the prostate. The pieces are removed through the same opening of the penis. This removes the blockage. ? Transurethral incision (TUIP). In this procedure, small cuts are made in the prostate. This lessens the prostate's pressure on the urethra. ? Transurethral microwave thermotherapy (TUMT). This procedure uses microwaves to create heat. The heat destroys and removes a small amount of prostate tissue. ? Transurethral needle ablation (TUNA). This procedure uses radio frequencies to destroy and remove a small amount of prostate tissue. ? Interstitial laser coagulation ([place]). This procedure uses a laser to destroy and remove a small amount of prostate tissue. ? Transurethral electrovaporization (TUVP). This procedure uses electrodes to destroy and remove a small amount of prostate tissue. ? Prostatic urethral lift. This procedure inserts an implant to push the lobes of the prostate away from the urethra. Follow these instructions at home:  Take over-the-counter and prescription medicines only as told by your health care provider.  Monitor your symptoms for any changes. Contact your health care provider with any changes.  Avoid drinking large amounts of liquid before going to bed or out in public.  Avoid or reduce how much caffeine or alcohol you drink.  Give  yourself time when you urinate.  Keep all follow-up visits as told by your health care provider. This is important. Contact a health care provider if:  You have unexplained back pain.  Your symptoms do not get better with treatment.  You develop side effects from the medicine you are taking.  Your urine becomes very dark or has a bad smell.  Your lower abdomen becomes distended and you have trouble passing your urine. Get help right away if:  You have a fever or chills.  You suddenly cannot urinate.  You feel lightheaded, or very dizzy, or you faint.  There are large amounts of blood or clots in the urine.  Your urinary problems become hard to manage.  You develop moderate to severe low back or flank pain. The flank is the side of your body between the ribs and the hip. These symptoms may represent a serious problem that is an emergency. Do not wait to see if the symptoms will go away. Get medical help right away. Call your local emergency services (911 in the [place] Do not drive yourself to the hospital. Summary  Benign  prostatic hyperplasia (BPH) is an enlarged prostate that is caused by the normal aging process and not by cancer.  An enlarged prostate can press on the urethra. This can make it hard to pass urine.  This condition is part of a normal aging process and is more likely to develop in men over the age of 33 years.  Get help right away if you suddenly cannot urinate. This information is not intended to replace advice given to you by your health care provider. Make sure you discuss any questions you have with your health care provider. Document Released: 08/15/2005 Document Revised: 09/19/2016 Document Reviewed: 09/19/2016 Elsevier Interactive Patient Education  2019 [employer].

## 2018-10-13 ENCOUNTER — Other Ambulatory Visit: Payer: Self-pay | Admitting: Family Medicine

## 2018-10-13 LAB — TOXASSURE SELECT 13 (MW), URINE

## 2018-10-13 LAB — SPECIMEN STATUS REPORT

## 2018-10-15 ENCOUNTER — Other Ambulatory Visit: Payer: Self-pay | Admitting: Family Medicine

## 2018-10-17 ENCOUNTER — Ambulatory Visit: Payer: 59 | Admitting: Gastroenterology

## 2018-10-17 ENCOUNTER — Encounter: Payer: Self-pay | Admitting: Gastroenterology

## 2018-10-17 VITALS — BP 113/72 | HR 92 | Temp 98.4°F | Ht 68.0 in | Wt 228.8 lb

## 2018-10-17 DIAGNOSIS — K219 Gastro-esophageal reflux disease without esophagitis: Secondary | ICD-10-CM

## 2018-10-17 DIAGNOSIS — Z86010 Personal history of colonic polyps: Secondary | ICD-10-CM | POA: Diagnosis not present

## 2018-10-17 DIAGNOSIS — R1319 Other dysphagia: Secondary | ICD-10-CM

## 2018-10-17 DIAGNOSIS — R1310 Dysphagia, unspecified: Secondary | ICD-10-CM | POA: Diagnosis not present

## 2018-10-17 DIAGNOSIS — D649 Anemia, unspecified: Secondary | ICD-10-CM | POA: Insufficient documentation

## 2018-10-17 NOTE — Patient Instructions (Addendum)
1. Continue omeprazole once daily before breakfast.  2. Continue iron.  3. Recheck anemia labs in 10/2018. 4. For gas you can try Gas-X or Beano which are bought over the counter.  5. You can try a probiotic for gas as well. Good options are Restora, [employer], or [employer].  6. Limit foods from the HIGH FODMAP column to see if this helps your gas.  7. Do not suck on candies, chew gum, use straws as these behaviors will increase swallowed air and belching. Carbonated beverages also cause increased air/belching. 8. Return to the office in six months.        Low-FODMAP Eating Plan  FODMAPs (fermentable oligosaccharides, disaccharides, monosaccharides, and polyols) are sugars that are hard for some people to digest. A low-FODMAP eating plan may help some people who have bowel (intestinal) diseases to manage their symptoms. This meal plan can be complicated to follow. Work with a diet and nutrition specialist (dietitian) to make a low-FODMAP eating plan that is right for you. A dietitian can make sure that you get enough nutrition from this diet. What are tips for following this plan? Reading food labels  Check labels for hidden FODMAPs such as: ? High-fructose syrup. ? Honey. ? Agave. ? Natural fruit flavors. ? Onion or garlic powder.  Choose low-FODMAP foods that contain 3-4 grams of fiber per serving.  Check food labels for serving sizes. Eat only one serving at a time to make sure FODMAP levels stay low. Meal planning  Follow a low-FODMAP eating plan for up to 6 weeks, or as told by your health care provider or dietitian.  To follow the eating plan: 1. Eliminate high-FODMAP foods from your diet completely. 2. Gradually reintroduce high-FODMAP foods into your diet one at a time. Most people should wait a few days after introducing one high-FODMAP food before they introduce the next high-FODMAP food. Your dietitian can recommend how quickly you may reintroduce foods. 3. Keep  a daily record of what you eat and drink, and make note of any symptoms that you have after eating. 4. Review your daily record with a dietitian regularly. Your dietitian can help you identify which foods you can eat and which foods you should avoid. General tips  Drink enough fluid each day to keep your urine pale yellow.  Avoid processed foods. These often have added sugar and may be high in FODMAPs.  Avoid most dairy products, whole grains, and sweeteners.  Work with a dietitian to make sure you get enough fiber in your diet. Recommended foods Grains  Gluten-free grains, such as rice, oats, buckwheat, quinoa, corn, polenta, and millet. Gluten-free pasta, bread, or cereal. Rice noodles. Corn tortillas. Vegetables  Eggplant, zucchini, cucumber, peppers, green beans, Brussels sprouts, bean sprouts, lettuce, arugula, kale, Swiss chard, spinach, collard greens, bok choy, summer squash, potato, and tomato. Limited amounts of corn, carrot, and sweet potato. Green parts of scallions. Fruits  Bananas, oranges, lemons, limes, blueberries, raspberries, strawberries, grapes, cantaloupe, honeydew melon, kiwi, papaya, passion fruit, and pineapple. Limited amounts of dried cranberries, banana chips, and shredded coconut. Dairy  Lactose-free milk, yogurt, and kefir. Lactose-free cottage cheese and ice cream. Non-dairy milks, such as almond, coconut, hemp, and rice milk. Yogurts made of non-dairy milks. Limited amounts of goat cheese, brie, mozzarella, parmesan, swiss, and other hard cheeses. Meats and other protein foods  Unseasoned beef, pork, poultry, or fish. Eggs. Berniece Salines. Tofu (firm) and tempeh. Limited amounts of nuts and seeds, such as almonds, walnuts, [place] nuts, pecans,  peanuts, pumpkin seeds, chia seeds, and sunflower seeds. Fats and oils  Butter-free spreads. Vegetable oils, such as olive, canola, and sunflower oil. Seasoning and other foods  Artificial sweeteners with names that do not  end in "ol" such as aspartame, saccharine, and stevia. Maple syrup, white table sugar, raw sugar, brown sugar, and molasses. Fresh basil, coriander, parsley, rosemary, and thyme. Beverages  Water and mineral water. Sugar-sweetened soft drinks. Small amounts of orange juice or cranberry juice. Black and green tea. Most dry wines. Coffee. This may not be a complete list of low-FODMAP foods. Talk with your dietitian for more information. Foods to avoid Grains  Wheat, including kamut, durum, and semolina. Barley and bulgur. Couscous. Wheat-based cereals. Wheat noodles, bread, crackers, and pastries. Vegetables  Chicory root, artichoke, asparagus, cabbage, snow peas, sugar snap peas, mushrooms, and cauliflower. Onions, garlic, leeks, and the white part of scallions. Fruits  Fresh, dried, and juiced forms of apple, pear, watermelon, peach, plum, cherries, apricots, blackberries, boysenberries, figs, nectarines, and mango. Avocado. Dairy  Milk, yogurt, ice cream, and soft cheese. Cream and sour cream. Milk-based sauces. Custard. Meats and other protein foods  Fried or fatty meat. Sausage. Cashews and pistachios. Soybeans, baked beans, black beans, chickpeas, kidney beans, fava beans, navy beans, lentils, and split peas. Seasoning and other foods  Any sugar-free gum or candy. Foods that contain artificial sweeteners such as sorbitol, mannitol, isomalt, or xylitol. Foods that contain honey, high-fructose corn syrup, or agave. Bouillon, vegetable stock, beef stock, and chicken stock. Garlic and onion powder. Condiments made with onion, such as hummus, chutney, pickles, relish, salad dressing, and salsa. Tomato paste. Beverages  Chicory-based drinks. Coffee substitutes. Chamomile tea. Fennel tea. Sweet or fortified wines such as port or sherry. Diet soft drinks made with isomalt, mannitol, maltitol, sorbitol, or xylitol. Apple, pear, and mango juice. Juices with high-fructose corn syrup. This may not  be a complete list of high-FODMAP foods. Talk with your dietitian to discuss what dietary choices are best for you.  Summary  A low-FODMAP eating plan is a short-term diet that eliminates FODMAPs from your diet to help ease symptoms of certain bowel diseases.  The eating plan usually lasts up to 6 weeks. After that, high-FODMAP foods are restarted gradually, one at a time, so you can find out which may be causing symptoms.  A low-FODMAP eating plan can be complicated. It is best to work with a dietitian who has experience with this type of plan. This information is not intended to replace advice given to you by your health care provider. Make sure you discuss any questions you have with your health care provider. Document Released: 04/11/2017 Document Revised: 04/11/2017 Document Reviewed: 04/11/2017 Elsevier Interactive Patient Education  [employer].

## 2018-10-17 NOTE — Assessment & Plan Note (Addendum)
Next colonoscopy due November 2022.  Advised patient to let his siblings and children know their first colonoscopy should have started at age 63 and every 5 years to follow. Patient underwent genetic testing in 2015 which was negative

## 2018-10-17 NOTE — Assessment & Plan Note (Signed)
Iron deficiency IDA.  He will continue iron for now.  We will plan to follow-up labs next month now that he has recovered from surgery as well as had numerous colon polyps removed.  Hopefully we will see an improvement in his microcytic anemia.  Further recommendations to follow.

## 2018-10-17 NOTE — Assessment & Plan Note (Signed)
Status post dilation of Schatzki ring.

## 2018-10-17 NOTE — Progress Notes (Signed)
Primary Care Physician: Mikey Kirschner, MD  Primary Gastroenterologist:  Barney Drain, MD   Chief Complaint  Patient presents with  . Dysphagia    pp f/u; doing ok    HPI: Shaun [name] is a 63 y.o. male here for follow-up.  He was seen in September for epigastric pain and dysphagia.  Also overdue for surveillance colonoscopy for history of multiple tubular adenomas removed, over 30 polyps total.  He underwent EGD and colonoscopy.  He was found to have a low-grade narrowing Schatzki ring which was dilated.  Gastritis.  32 polyps removed from the colon, 10 were tubular adenomas.  Next colonoscopy planned for 3 years.  Since we last saw him he presented to the emergency department January 20 abdominal pain, black stools.  He was determined to have a small bowel perforation that was contained, fairly tender on exam.  He underwent emergent exploratory laparotomy with small bowel resection, primary side-to-side stapled anastomosis.  Overall he is recovering from his surgery.  Really not having any abdominal discomfort.  Complains of excessive belching.  Seems to be worse in the evening.  No nausea or vomiting.  Dysphagia resolved after dilation of his esophagus.  Bowel movements are okay.  No blood in the stool or melena.  We discussed EGD and colonoscopy findings at length.  He was reminded that his siblings, children should all have colonoscopy starting at age 4 and then every 5 years.  He also has a history of chronic microcytic anemia.  In November his hemoglobin was 10.3, MCV 73.7.  January 20 his hemoglobin was 10.4, MCV 73.4.  Postoperatively and at time of discharge his hemoglobin was 9, MCV 76.5.  Anemia labs back in November consistent with IDA.  Current Outpatient Medications  Medication Sig Dispense Refill  . atorvastatin (LIPITOR) 80 MG tablet Take 1 tablet (80 mg total) by mouth daily. 90 tablet 1  . buPROPion (WELLBUTRIN SR) 150 MG 12 hr tablet TAKE 1 TABLET BY  MOUTH EVERY MORNING FOR 3 DAYS THEN TAKE 1 TABLET TWICE DAILY 60 tablet 5  . docusate sodium (COLACE) 100 MG capsule Take 1 capsule (100 mg total) by mouth 2 (two) times daily. 60 capsule 2  . ELIQUIS 5 MG TABS tablet TAKE 1 TABLET(5 MG) BY MOUTH TWICE DAILY 60 tablet 6  . escitalopram (LEXAPRO) 20 MG tablet TAKE 1 TABLET(20 MG) BY MOUTH DAILY (Patient taking differently: Take 20 mg by mouth daily. ) 30 tablet 5  . furosemide (LASIX) 20 MG tablet TAKE 1 TABLET BY MOUTH EVERY DAY 90 tablet 1  . HYDROcodone-acetaminophen (NORCO/VICODIN) 5-325 MG tablet Take one tablet bid prn pain 60 tablet 0  . HYDROcodone-acetaminophen (NORCO/VICODIN) 5-325 MG tablet Take tablet bid prn pain 60 tablet 0  . iron polysaccharides (NU-IRON) 150 MG capsule 1 PO BID FOR 3 MOS (Patient taking differently: Take 150 mg by mouth daily. 1 PO BID FOR 3 MOS) 90 capsule 0  . lisinopril (PRINIVIL,ZESTRIL) 5 MG tablet TAKE 1 TABLET BY MOUTH EVERY DAY (Patient taking differently: Take 5 mg by mouth daily. ) 90 tablet 1  . meclizine (ANTIVERT) 25 MG tablet Take 1 tablet (25 mg total) by mouth 3 (three) times daily as needed for dizziness. 30 tablet 0  . metFORMIN (GLUCOPHAGE) 500 MG tablet Take 1 tablet (500 mg total) by mouth 2 (two) times daily with a meal. 360 tablet 0  . metoprolol tartrate (LOPRESSOR) 50 MG tablet TAKE 1 AND 1/2 TABLETS BY  MOUTH TWICE DAILY (Patient taking differently: Take 75 mg by mouth 2 (two) times daily. ) 270 tablet 3  . nitroGLYCERIN (NITROSTAT) 0.4 MG SL tablet Place 1 tablet (0.4 mg total) under the tongue every 5 (five) minutes as needed. (Patient taking differently: Place 0.4 mg under the tongue every 5 (five) minutes as needed for chest pain. ) 25 tablet 3  . omeprazole (PRILOSEC) 40 MG capsule Take 1 capsule (40 mg total) by mouth daily. 30 capsule 5  . ondansetron (ZOFRAN-ODT) 4 MG disintegrating tablet Take 1 tablet (4 mg total) by mouth every 6 (six) hours as needed for nausea. 20 tablet 0  .  potassium chloride SA (K-DUR,KLOR-CON) 20 MEQ tablet TAKE 1 TABLET BY MOUTH EVERY DAY 90 tablet 1  . rOPINIRole (REQUIP) 3 MG tablet Take 1 tablet (3 mg total) by mouth at bedtime. 90 tablet 1  . sildenafil (VIAGRA) 100 MG tablet TAKE 1 TABLET BY MOUTH EVERY DAY AS NEEDED FOR ERECTILE DYSFUNCTION 10 tablet 0  . sodium chloride (OCEAN) 0.65 % SOLN nasal spray Place 1 spray into both nostrils daily as needed for congestion.     . tamsulosin (FLOMAX) 0.4 MG CAPS capsule Take 1 capsule (0.4 mg total) by mouth daily after breakfast. 30 capsule 2  . tetrahydrozoline (VISINE) 0.05 % ophthalmic solution Place 2 drops into both eyes daily as needed (for dry eyes).    . traZODone (DESYREL) 50 MG tablet TAKE 2 TABLETS(100 MG) BY MOUTH AT BEDTIME (Patient taking differently: Take 100 mg by mouth at bedtime. ) 60 tablet 5  . triamcinolone cream (KENALOG) 0.1 % Apply 1 application topically 2 (two) times daily. (Patient taking differently: Apply 1 application topically daily as needed (for irritation). ) 60 g 1  . VENTOLIN HFA 108 (90 Base) MCG/ACT inhaler INHALE 2 PUFFS INTO THE LUNGS EVERY 6 HOURS AS NEEDED FOR WHEEZING OR SHORTNESS OF BREATH (Patient taking differently: Inhale 2 puffs into the lungs every 6 (six) hours as needed for wheezing or shortness of breath. ) 18 g 0   No current facility-administered medications for this visit.     Allergies as of 10/17/2018  . (No Known Allergies)    ROS:  General: Negative for anorexia, weight loss, fever, chills, fatigue, weakness. ENT: Negative for hoarseness, difficulty swallowing , nasal congestion. CV: Negative for chest pain, angina, palpitations, dyspnea on exertion, peripheral edema.  Respiratory: Negative for dyspnea at rest, dyspnea on exertion, cough, sputum, wheezing.  GI: See history of present illness. GU:  Negative for dysuria, hematuria, urinary incontinence, urinary frequency, nocturnal urination.  Endo: Negative for unusual weight change.     Physical Examination:   BP 113/72   Pulse 92   Temp 98.4 F (36.9 C) (Oral)   Ht 5\' 8"  (1.727 m)   Wt 228 lb 12.8 oz (103.8 kg)   BMI 34.79 kg/m   General: Well-nourished, well-developed in no acute distress.  Eyes: No icterus. Mouth: Oropharyngeal mucosa moist and pink , no lesions erythema or exudate. Lungs: Clear to auscultation bilaterally.  Heart: Regular rate and rhythm, no murmurs rubs or gallops.  Abdomen: Bowel sounds are normal, nontender, nondistended, no hepatosplenomegaly or masses, no abdominal bruits or hernia , no rebound or guarding.   Extremities: No lower extremity edema. No clubbing or deformities. Neuro: Alert and oriented x 4   Skin: Warm and dry, no jaundice.   Psych: Alert and cooperative, normal mood and affect.  Labs:  Lab Results  Component Value Date  CREATININE 0.90 09/20/2018   BUN 22 09/20/2018   NA 140 09/20/2018   K 4.4 09/20/2018   CL 105 09/20/2018   CO2 28 09/20/2018   Lab Results  Component Value Date   ALT 28 09/17/2018   AST 19 09/17/2018   ALKPHOS 95 09/17/2018   BILITOT 1.3 (H) 09/17/2018   Lab Results  Component Value Date   WBC 8.0 09/20/2018   HGB 9.0 (L) 09/20/2018   HCT 30.2 (L) 09/20/2018   MCV 76.5 (L) 09/20/2018   PLT 244 09/20/2018   Lab Results  Component Value Date   IRON 27 (L) 07/10/2018   TIBC 524 (H) 07/10/2018   FERRITIN 4 (L) 07/10/2018    Imaging Studies: Dg Chest Port 1 View  Result Date: 09/21/2018 CLINICAL DATA:  Shortness of breath. EXAM: PORTABLE CHEST 1 VIEW COMPARISON:  09/19/2018 FINDINGS: Interval removal of orogastric tube. Low volumes and interstitial coarsening which is likely vascular congestion. No effusion or pneumothorax. Normal heart size. Asymmetric elevation of the right diaphragm. IMPRESSION: Stable vascular congestion and low lung volumes. Electronically Signed   By: Monte Fantasia M.D.   On: 09/21/2018 07:00   Dg Chest Port 1 View  Result Date: 09/19/2018 CLINICAL DATA:   Hypoxia EXAM: PORTABLE CHEST 1 VIEW COMPARISON:  09/17/2018 FINDINGS: Cardiac shadow is stable. Gastric catheter is noted extending towards the stomach. The tip is not well visualized. Central vascular congestion and hypoinflation is noted. No bony abnormality is seen. IMPRESSION: Gastric catheter extending towards the stomach. Mild central vascular congestion and poor inspiratory effort. Electronically Signed   By: Inez Catalina M.D.   On: 09/19/2018 10:41

## 2018-10-17 NOTE — Assessment & Plan Note (Signed)
Typical heartburn symptoms well controlled on omeprazole.  He complains of excessive belching.  No other alarm symptoms.  Discussed lifestyle factors that can increase swallowed air and contribute to belching.  Limit high FODMAP foods.  Trial of probiotic, Gas-X or Beano.

## 2018-10-17 NOTE — Assessment & Plan Note (Signed)
>>  ASSESSMENT AND PLAN FOR GERD (GASTROESOPHAGEAL REFLUX DISEASE) WRITTEN ON 10/17/2018  4:26 PM BY LEWIS, LESLIE S, PA-C  Typical heartburn symptoms well controlled on omeprazole.  He complains of excessive belching.  No other alarm symptoms.  Discussed lifestyle factors that can increase swallowed air and contribute to belching.  Limit high FODMAP foods.  Trial of probiotic, Gas-X or Beano.

## 2018-10-18 NOTE — Progress Notes (Signed)
cc'ed to pcp °

## 2018-10-22 ENCOUNTER — Other Ambulatory Visit: Payer: Self-pay | Admitting: Family Medicine

## 2018-10-25 ENCOUNTER — Encounter: Payer: Self-pay | Admitting: General Surgery

## 2018-10-25 ENCOUNTER — Ambulatory Visit (INDEPENDENT_AMBULATORY_CARE_PROVIDER_SITE_OTHER): Payer: Self-pay | Admitting: General Surgery

## 2018-10-25 VITALS — BP 128/92 | HR 93 | Temp 98.0°F | Resp 18 | Wt 232.0 lb

## 2018-10-25 DIAGNOSIS — K631 Perforation of intestine (nontraumatic): Secondary | ICD-10-CM

## 2018-10-25 NOTE — Progress Notes (Signed)
[facility] Note   HPI:  63 y.o. Male presents to clinic for follow-up evaluation for his wound. It is healing nicely. He is starting to feel less pain and is having Bms that are sort of irregular. He is still taking colace and not on metamucil / fiber.   Review of Systems:  No fever or chills Some upper epigastric reflux/ fullness Irregular BM from soft/ loose and multiple a day to nothing the next  All other review of systems: otherwise negative   Vital Signs:  BP (!) 128/92 (BP Location: Left Arm, Patient Position: Sitting, Cuff Size: Large)   Pulse 93   Temp 98 F (36.7 C) (Temporal)   Resp 18   Wt 232 lb (105.2 kg)   BMI 35.28 kg/m    Physical Exam:  Physical Exam Vitals signs reviewed.  Cardiovascular:     Rate and Rhythm: Normal rate.  Pulmonary:     Effort: Pulmonary effort is normal.  Abdominal:     General: There is no distension.     Palpations: Abdomen is soft.     Tenderness: There is no abdominal tenderness.     Comments: Midline almost healed, superficial granulation line, no drainage or erythema  Neurological:     Mental Status: He is alert.     Assessment:  63 y.o. yo Male with a history of a small bowel perforation s/p resection and primary anastomosis for enteritis.   Plan:  Dry dressing the the abdomen daily. Start to increase activity slowly. Still no lifting > 10 lbs until 6 weeks post op.  Take metamucil/ benefiber and get off the colace.  Follow up in 2 weeks to discuss return to work plan (patient works 12-13 hr shifts).    All of the above recommendations were discussed with the patient and all of patient's were answered to his expressed satisfaction.  Curlene Labrum, MD [facility] [address] [place], [place] [address] [phone] (office)

## 2018-10-25 NOTE — Patient Instructions (Signed)
Dry dressing the the abdomen daily. Start to increase activity slowly. Still no lifting > 10 lbs until 6 weeks post op.  Take metamucil/ benefiber and get off the colace.

## 2018-10-30 ENCOUNTER — Other Ambulatory Visit: Payer: Self-pay | Admitting: Cardiology

## 2018-10-30 ENCOUNTER — Other Ambulatory Visit: Payer: Self-pay | Admitting: Family Medicine

## 2018-10-31 ENCOUNTER — Telehealth: Payer: Self-pay | Admitting: Family Medicine

## 2018-10-31 MED ORDER — HYDROCODONE-ACETAMINOPHEN 5-325 MG PO TABS: ORAL_TABLET | ORAL | 0 refills | Status: DC

## 2018-10-31 NOTE — Telephone Encounter (Signed)
Patient notified

## 2018-10-31 NOTE — Telephone Encounter (Signed)
Patient requesting refill for HYDROcodone-acetaminophen (NORCO/VICODIN) 5-325 MG tablet   Next refill for 60 days after 10/03/18. Patient states only has enough medication to last today only.  Pharmacy:  [facility] [place], [place] [address]

## 2018-10-31 NOTE — Telephone Encounter (Signed)
The prescription says may fill in 60 and 90 days but no script sent for 30 days from 10/03/18

## 2018-10-31 NOTE — Telephone Encounter (Signed)
done

## 2018-10-31 NOTE — Telephone Encounter (Signed)
May do two more rx's plz

## 2018-11-05 ENCOUNTER — Other Ambulatory Visit: Payer: Self-pay | Admitting: Family Medicine

## 2018-11-08 ENCOUNTER — Encounter: Payer: Self-pay | Admitting: General Surgery

## 2018-11-08 ENCOUNTER — Other Ambulatory Visit: Payer: Self-pay

## 2018-11-08 ENCOUNTER — Ambulatory Visit (INDEPENDENT_AMBULATORY_CARE_PROVIDER_SITE_OTHER): Payer: Self-pay | Admitting: General Surgery

## 2018-11-08 VITALS — BP 108/72 | HR 76 | Temp 96.4°F | Resp 20 | Wt 233.0 lb

## 2018-11-08 DIAGNOSIS — K631 Perforation of intestine (nontraumatic): Secondary | ICD-10-CM

## 2018-11-08 NOTE — Progress Notes (Signed)
[facility] Note   HPI:  63 y.o. Male presents to clinic for follow-up evaluation after his SBR. He is doing fair. His stools are now hard. He is getting more active but is not doing much and has not lifted anything.   Review of Systems:  Pain improving but still some with activity  Stools hard Weakness generalized  All other review of systems: otherwise negative   Vital Signs:  BP 108/72 (BP Location: Left Arm, Patient Position: Sitting, Cuff Size: Large)   Pulse 76   Temp (!) 96.4 F (35.8 C) (Temporal)   Resp 20   Wt 233 lb (105.7 kg)   BMI 35.43 kg/m    Physical Exam:  Physical Exam Vitals signs reviewed.  Cardiovascular:     Rate and Rhythm: Normal rate.  Pulmonary:     Effort: Pulmonary effort is normal.  Abdominal:     General: There is no distension.     Palpations: Abdomen is soft.     Tenderness: There is no abdominal tenderness.     Comments: Midline healed, no hernia noted   Musculoskeletal:        General: No swelling.  Skin:    General: Skin is warm and dry.  Neurological:     Mental Status: He is alert.     Assessment:  63 y.o. yo Male with a history of a small bowel perforation s/p resection and primary repair for enteritis. Doing fair but having hard stools. He has not gotten in metamucil.   Plan:  - Increase your activity, you can start to lift > 10 lbs, improve your stamina  - Take metamucil and drink 64 ounces of water to get stools regular and soft  - Follow up in the next 2 weeks to see if getting stronger and pain improving, will make plan for return to work as he is just 6-7 weeks out at this time   Future Appointments  Date Time Provider [place]  11/20/2018  9:45 AM Virl Cagey, MD [facility] None  01/17/2019 10:00 AM Mikey Kirschner, MD [facility]  04/17/2019  1:30 PM Mahala Menghini, PA-C [facility]     All of the above recommendations were discussed with the patient and patient's family, and all of  patient's and family's questions were answered to their expressed satisfaction.  Curlene Labrum, MD [facility] [address] [place], [place] [address] [phone] (office)

## 2018-11-08 NOTE — Patient Instructions (Signed)
Take Metamucil daily to see if that helps with the stools. Drink 64 ounces of water a day. Activity as tolerated. You can lift as you are able to.

## 2018-11-13 ENCOUNTER — Telehealth: Payer: Self-pay | Admitting: Cardiology

## 2018-11-13 DIAGNOSIS — I5032 Chronic diastolic (congestive) heart failure: Secondary | ICD-10-CM

## 2018-11-13 NOTE — Telephone Encounter (Signed)
Needs follow-up office visit.  Increase Lasix to 40 mg daily.  Check potassium and renal function in 1 week. Kirk Ruths, MD

## 2018-11-13 NOTE — Telephone Encounter (Signed)
New message   Pt c/o medication issue:  1. Name of Medication: furosemide (LASIX) 20 MG tablet  2. How are you currently taking this medication (dosage and times per day)? 1 time daily  3. Are you having a reaction (difficulty breathing--STAT)?no   4. What is your medication issue? Patient wants to know if he should take 2 of the tablets due to additional fluid buildup. Please advise.

## 2018-11-13 NOTE — Telephone Encounter (Signed)
Returned call to pt. He states he has been experiencing swelling around his abdomen and ankles. He report he does weigh himself daily and has gained 12 pounds since 3/11.  He reports SOB only with activity but denies any other symptoms. Will route to MD

## 2018-11-13 NOTE — Telephone Encounter (Signed)
New Message        Pt c/o swelling: STAT is pt has developed SOB within 24 hours  1) How much weight have you gained and in what time span? 10.6 pds in From the 11th 13  2) If swelling, where is the swelling located? Belly   3) Are you currently taking a fluid pill?Yes   4) Are you currently SOB? Yes  5) Do you have a log of your daily weights (if so, list)? Yes  6) Have you gained 3 pounds in a day or 5 pounds in a week? Yes  7) Have you traveled recently? Unknown          Juliann Pulse with [employer] heart failure program called and will fax over information, she would like someone to call the patient.

## 2018-11-14 MED ORDER — FUROSEMIDE 20 MG PO TABS: 40.0000 mg | ORAL_TABLET | Freq: Every day | ORAL | 1 refills | Status: DC

## 2018-11-14 NOTE — Telephone Encounter (Signed)
Spoke with pt, Aware of dr crenshaw's recommendations.  Lab orders mailed to the pt  

## 2018-11-18 ENCOUNTER — Other Ambulatory Visit: Payer: Self-pay | Admitting: Family Medicine

## 2018-11-19 ENCOUNTER — Telehealth: Payer: Self-pay | Admitting: General Surgery

## 2018-11-19 NOTE — Telephone Encounter (Signed)
[facility]  The patient is being called or seen in a Webex encounter due to the current Covid 19 virus and attempts at limiting patient clinic visits in the spirit of social distancing.    Patient s/p Exploratory laparotomy, small bowel resection, primary side to side stapled anastomosis 09/17/2018.  He has been healing his midline wound but this was looking almost healed at the last visit. Scab in the area now.  Stools still somewhat hard. Hasn't tried fiber yet. Patient has been out of work since the surgery. He does 13 hour shifts at his work.  He works 3 -13 hour shifts, and then has off about 3 days off and then works 4 days -13 hour shifts.  At this time we are a little worried about his stamina but he thinks he can go back to work.  Mr. Arnone can return to work starting 11/19/2018.  He will need to be on a restricted schedule for 8 hours for the first 3 shifts he is back at work.  He can then graduate to his normal 13 hour shifts.  He may need more frequent breaks for those first 3 shifts back.  He otherwise has no lifting restrictions or physical activity restrictions.    We cancel the appointment for tomorrow as he is doing well. Will get this paperwork filled out for going back to work.   Mr. Amadon has a form that must be filled out, and they will bring this to the office.  Curlene Labrum, MD [facility] [address] [place], [place] [address] [phone] (office)

## 2018-11-20 ENCOUNTER — Ambulatory Visit: Payer: Self-pay | Admitting: General Surgery

## 2018-11-21 ENCOUNTER — Telehealth: Payer: Self-pay | Admitting: Cardiology

## 2018-11-21 NOTE — Telephone Encounter (Signed)
   Primary Cardiologist:  Kirk Ruths, MD   Patient contacted.  History reviewed.  No symptoms to suggest any unstable cardiac conditions.  Based on discussion, with current pandemic situation, we will be postponing this appointment for Shaun [name] with a plan for f/u in 6-12 wks or sooner if feasible/necessary.  If symptoms change, he has been instructed to contact our office.   Routing to C19 CANCEL pool for tracking (P CV DIV CV19 CANCEL - reason for visit "other.") and assigning priority (1 = 4-6 wks, 2 = 6-12 wks, 3 = >12 wks).   Kerin Ransom, [place]  11/21/2018 10:37 AM         .

## 2018-11-22 ENCOUNTER — Ambulatory Visit: Payer: 59 | Admitting: Cardiology

## 2018-11-28 ENCOUNTER — Other Ambulatory Visit: Payer: Self-pay | Admitting: Family Medicine

## 2018-11-29 ENCOUNTER — Telehealth: Payer: Self-pay | Admitting: Family Medicine

## 2018-11-29 NOTE — Telephone Encounter (Signed)
Patient is requesting refill on hydrocodone 5/325 to [facility]

## 2018-11-29 NOTE — Telephone Encounter (Signed)
Last office visit for pain management 10/09/2018. Please advise. Thank you

## 2018-11-30 NOTE — Telephone Encounter (Signed)
Pharmacy does have the prescription ready to fill. Patient notified.

## 2018-11-30 NOTE — Telephone Encounter (Signed)
Did we not do three r's?

## 2018-12-02 ENCOUNTER — Other Ambulatory Visit: Payer: Self-pay | Admitting: Family Medicine

## 2018-12-03 ENCOUNTER — Telehealth: Payer: Self-pay | Admitting: Family Medicine

## 2018-12-03 NOTE — Telephone Encounter (Signed)
Left message to return call 

## 2018-12-03 NOTE — Telephone Encounter (Signed)
Pt wants to know if he should be working right now with everything going on he works for a [employer] and [employer].   CB# [phone].

## 2018-12-04 ENCOUNTER — Other Ambulatory Visit: Payer: Self-pay | Admitting: Cardiology

## 2018-12-04 NOTE — Telephone Encounter (Signed)
Lisinopril refilled.

## 2018-12-04 NOTE — Telephone Encounter (Signed)
Pt returned call and states that his work place has had confirmed cases, one was yesterday. Pt states he is not having any symptoms at this time but he doesn't want to catch anything. Pt states he has several health conditions. Pt states he does not know if his company has FMLA or are allowing people to take off time due to virus. Please advise. Thank you

## 2018-12-05 ENCOUNTER — Telehealth: Payer: Self-pay | Admitting: *Deleted

## 2018-12-05 NOTE — Telephone Encounter (Signed)
Pt's letter for work is ready. I called pt and he said to mail it. Letter placed in mail and copy sent to medical records to be scanned into chart.

## 2018-12-07 ENCOUNTER — Telehealth: Payer: Self-pay | Admitting: Cardiology

## 2018-12-07 NOTE — Telephone Encounter (Signed)
12/07/18 @ 2:40 pm lm for patient to call & reschedule 11/22/18 visit that was cancelled due to the pandemic

## 2018-12-11 ENCOUNTER — Telehealth: Payer: Self-pay

## 2018-12-11 ENCOUNTER — Ambulatory Visit: Payer: 59 | Admitting: Cardiology

## 2018-12-11 NOTE — Telephone Encounter (Signed)
   Cardiac Questionnaire:    Since your last visit or hospitalization:    1. Have you been having new or worsening chest pain? NO   2. Have you been having new or worsening shortness of breath? NO 3. Have you been having new or worsening leg swelling, wt gain, or increase in abdominal girth (pants fitting more tightly)? NO   4. Have you had any passing out spells? NO    *A YES to any of these questions would result in the appointment being kept. *If all the answers to these questions are NO, we should indicate that given the current situation regarding the worldwide coronarvirus pandemic, at the recommendation of the CDC, we are looking to limit gatherings in our waiting area, and thus will reschedule their appointment beyond four weeks from today.   _____________   COVID-19 Pre-Screening Questions:  . Do you currently have a fever? NO (yes = cancel and refer to pcp for e-visit) . Have you recently travelled on a cruise, internationally, or to [place], [place], [place], [place], [place], or [place], [place] ([employer]) ? NO (yes = cancel, stay home, monitor symptoms, and contact pcp or initiate e-visit if symptoms develop) . Have you been in contact with someone that is currently pending confirmation of Covid19 testing or has been confirmed to have the Covid19 virus?  NO (yes = cancel, stay home, away from tested individual, monitor symptoms, and contact pcp or initiate e-visit if symptoms develop) . Are you currently experiencing fatigue or cough? NO (yes = pt should be prepared to have a mask placed at the time of their visit).           

## 2018-12-11 NOTE — Telephone Encounter (Signed)
Virtual Visit Pre-Appointment Phone Call  Steps For Call:  1. Confirm consent - "In the setting of the current Covid19 crisis, you are scheduled for a PHONE visit with your provider on 12/12/2018 at 9:00AM.  Just as we do with many in-office visits, in order for you to participate in this visit, we must obtain consent.  If you'd like, I can send this to your mychart (if signed up) or email for you to review.  Otherwise, I can obtain your verbal consent now.  All virtual visits are billed to your insurance company just like a normal visit would be.  By agreeing to a virtual visit, we'd like you to understand that the technology does not allow for your provider to perform an examination, and thus may limit your provider's ability to fully assess your condition.  Finally, though the technology is pretty good, we cannot assure that it will always work on either your or our end, and in the setting of a video visit, we may have to convert it to a phone-only visit.  In either situation, we cannot ensure that we have a secure connection.  Are you willing to proceed?" STAFF: Did the patient verbally acknowledge consent to telehealth visit? Document YES/NO: YES  2. Confirm the BEST phone number to call the day of the visit by including in appointment notes 8:45AM  3. Give patient instructions for WebEx/MyChart download to smartphone as below or Doximity/Doxy.me if video visit (depending on what platform provider is using)  4. Advise patient to be prepared with their blood pressure, heart rate, weight, any heart rhythm information, their current medicines, and a piece of paper and pen handy for any instructions they may receive the day of their visit  5. Inform patient they will receive a phone call 15 minutes prior to their appointment time (may be from unknown caller ID) so they should be prepared to answer  6. Confirm that appointment type is correct in Epic appointment notes (VIDEO vs PHONE)      TELEPHONE CALL NOTE  Shaun [name] has been deemed a candidate for a follow-up tele-health visit to limit community exposure during the Covid-19 pandemic. I spoke with the patient via phone to ensure availability of phone/video source, confirm preferred email & phone number, and discuss instructions and expectations.  I reminded Shaun [name] to be prepared with any vital sign and/or heart rhythm information that could potentially be obtained via home monitoring, at the time of his visit. I reminded Shaun [name] to expect a phone call at the time of his visit if his visit.  Harold Hedge, CMA 12/11/2018 3:39 PM   INSTRUCTIONS FOR DOWNLOADING THE [place] APP TO SMARTPHONE  - If Apple, ask patient to go to [employer] and type in WebEx in the search bar. [place] [employer], the blue/green circle. If Android, go to [employer] and type in [employer] in the search bar. The app is free but as with any other app downloads, their phone may require them to verify saved payment information or Apple/Android password.  - The patient does NOT have to create an account. - On the day of the visit, the assist will walk the patient through joining the meeting with the meeting number/password.  INSTRUCTIONS FOR DOWNLOADING THE MYCHART APP TO SMARTPHONE  - The patient must first make sure to have activated MyChart and know their login information - If Apple, go to [employer] and type in [employer]  in the search bar and download the app. If Android, ask patient to go to [employer] and type in [place] in the search bar and download the app. The app is free but as with any other app downloads, their phone may require them to verify saved payment information or Apple/Android password.  - The patient will need to then log into the app with their MyChart username and password, and select [facility] as their healthcare provider to link the account. When it is time for your visit, go to the  MyChart app, find appointments, and click Begin Video Visit. Be sure to Select Allow for your device to access the Microphone and Camera for your visit. You will then be connected, and your provider will be with you shortly.  **If they have any issues connecting, or need assistance please contact MyChart service desk [phone])**  **If using a computer, in order to ensure the best quality for their visit they will need to use either of the following Internet Browsers: [employer], or Google Chrome**  IF USING DOXIMITY or DOXY.ME - The patient will receive a link just prior to their visit, either by text or email (to be determined day of appointment depending on if it's doxy.me or Doximity).     FULL LENGTH CONSENT FOR TELE-HEALTH VISIT   I hereby voluntarily request, consent and authorize [facility] and its employed or contracted physicians, physician assistants, nurse practitioners or other licensed health care professionals (the Practitioner), to provide me with telemedicine health care services (the "Services") as deemed necessary by the treating Practitioner. I acknowledge and consent to receive the Services by the Practitioner via telemedicine. I understand that the telemedicine visit will involve communicating with the Practitioner through live audiovisual communication technology and the disclosure of certain medical information by electronic transmission. I acknowledge that I have been given the opportunity to request an in-person assessment or other available alternative prior to the telemedicine visit and am voluntarily participating in the telemedicine visit.  I understand that I have the right to withhold or withdraw my consent to the use of telemedicine in the course of my care at any time, without affecting my right to future care or treatment, and that the Practitioner or I may terminate the telemedicine visit at any time. I understand that I have the right to  inspect all information obtained and/or recorded in the course of the telemedicine visit and may receive copies of available information for a reasonable fee.  I understand that some of the potential risks of receiving the Services via telemedicine include:  Marland Kitchen Delay or interruption in medical evaluation due to technological equipment failure or disruption; . Information transmitted may not be sufficient (e.g. poor resolution of images) to allow for appropriate medical decision making by the Practitioner; and/or  . In rare instances, security protocols could fail, causing a breach of personal health information.  Furthermore, I acknowledge that it is my responsibility to provide information about my medical history, conditions and care that is complete and accurate to the best of my ability. I acknowledge that Practitioner's advice, recommendations, and/or decision may be based on factors not within their control, such as incomplete or inaccurate data provided by me or distortions of diagnostic images or specimens that may result from electronic transmissions. I understand that the practice of medicine is not an exact science and that Practitioner makes no warranties or guarantees regarding treatment outcomes. I acknowledge that I will receive a copy of  this consent concurrently upon execution via email to the email address I last provided but may also request a printed copy by calling the office of [facility].    I understand that my insurance will be billed for this visit.   I have read or had this consent read to me. . I understand the contents of this consent, which adequately explains the benefits and risks of the Services being provided via telemedicine.  . I have been provided ample opportunity to ask questions regarding this consent and the Services and have had my questions answered to my satisfaction. . I give my informed consent for the services to be provided through the use of  telemedicine in my medical care  By participating in this telemedicine visit I agree to the above.

## 2018-12-12 ENCOUNTER — Telehealth: Payer: Self-pay

## 2018-12-12 ENCOUNTER — Encounter: Payer: Self-pay | Admitting: Cardiology

## 2018-12-12 ENCOUNTER — Telehealth (INDEPENDENT_AMBULATORY_CARE_PROVIDER_SITE_OTHER): Payer: 59 | Admitting: Cardiology

## 2018-12-12 ENCOUNTER — Other Ambulatory Visit: Payer: Self-pay | Admitting: Family Medicine

## 2018-12-12 VITALS — BP 106/82 | HR 76 | Ht 68.0 in | Wt 230.0 lb

## 2018-12-12 DIAGNOSIS — D649 Anemia, unspecified: Secondary | ICD-10-CM

## 2018-12-12 DIAGNOSIS — I482 Chronic atrial fibrillation, unspecified: Secondary | ICD-10-CM

## 2018-12-12 DIAGNOSIS — I2510 Atherosclerotic heart disease of native coronary artery without angina pectoris: Secondary | ICD-10-CM | POA: Diagnosis not present

## 2018-12-12 DIAGNOSIS — E119 Type 2 diabetes mellitus without complications: Secondary | ICD-10-CM

## 2018-12-12 DIAGNOSIS — Z9861 Coronary angioplasty status: Secondary | ICD-10-CM

## 2018-12-12 DIAGNOSIS — E785 Hyperlipidemia, unspecified: Secondary | ICD-10-CM

## 2018-12-12 DIAGNOSIS — I10 Essential (primary) hypertension: Secondary | ICD-10-CM

## 2018-12-12 DIAGNOSIS — Z7901 Long term (current) use of anticoagulants: Secondary | ICD-10-CM

## 2018-12-12 MED ORDER — SILDENAFIL CITRATE 100 MG PO TABS: ORAL_TABLET | ORAL | 3 refills | Status: DC

## 2018-12-12 NOTE — Patient Instructions (Signed)
Medication Instructions:  Your physician recommends that you continue on your current medications as directed. Please refer to the Current Medication list given to you today. If you need a refill on your cardiac medications before your next appointment, please call your pharmacy.   Lab work: Your physician recommends that you return for lab work in: [place]: BMET, CBC If you have labs (blood work) drawn today and your tests are completely normal, you will receive your results only by: Marland Kitchen MyChart Message (if you have MyChart) OR . A paper copy in the mail If you have any lab test that is abnormal or we need to change your treatment, we will call you to review the results.  Testing/Procedures: None   Follow-Up: At [facility], you and your health needs are our priority.  As part of our continuing mission to provide you with exceptional heart care, we have created designated Provider Care Teams.  These Care Teams include your primary Cardiologist (physician) and Advanced Practice Providers (APPs -  Physician Assistants and Nurse Practitioners) who all work together to provide you with the care you need, when you need it. You will need a follow up appointment in 3-4 months.  Please call our office 2 months in advance to schedule this appointment.  You may see Kirk Ruths, MD or one of the following Advanced Practice Providers on your designated Care Team:   Kerin Ransom, PA-C Roby Lofts, [place] . Sande Rives, PA-C  Any Other Special Instructions Will Be Listed Below (If Applicable).

## 2018-12-12 NOTE — Telephone Encounter (Signed)
3 mo both

## 2018-12-12 NOTE — Telephone Encounter (Signed)
Called patient to review AVS instructions. Patient requested his AVS not be mailed to him and that he pick it up when he comes to the office on Friday for Lab work. Advised patient that I will have someone place AVS at the front desk. He voiced understanding.

## 2018-12-12 NOTE — Addendum Note (Signed)
Addended by: Ulice Brilliant T on: 12/12/2018 10:10 AM   Modules accepted: Orders

## 2018-12-12 NOTE — Progress Notes (Signed)
Virtual Visit via Telephone Note   This visit type was conducted due to national recommendations for restrictions regarding the COVID-19 Pandemic (e.g. social distancing) in an effort to limit this patient's exposure and mitigate transmission in our community.  Due to his co-morbid illnesses, this patient is at least at moderate risk for complications without adequate follow up.  This format is felt to be most appropriate for this patient at this time.  The patient did not have access to video technology/had technical difficulties with video requiring transitioning to audio format only (telephone).  All issues noted in this document were discussed and addressed.  No physical exam could be performed with this format.  Please refer to the patient's chart for his  consent to telehealth for Shaun [name].  Evaluation Performed:  Follow-up visit  This visit type was conducted due to national recommendations for restrictions regarding the COVID-19 Pandemic (e.g. social distancing).  This format is felt to be most appropriate for this patient at this time.  All issues noted in this document were discussed and addressed.  No physical exam was performed (except for noted visual exam findings with Video Visits).  Please refer to the patient's chart (Shaun [name] for video visits and phone note for telephone visits) for the patient's consent to telehealth for Shaun [name].  Date:  12/12/2018   ID:  Shaun [name], DOB [birth year], MRN [id number]  Patient Location: [place] [address]   Provider location:   Home- [place]  PCP:  Shaun Kirschner, MD  Cardiologist:  Shaun Ruths, MD  Electrophysiologist:  None   Chief Complaint:  F/U from 11/13/18 OV  History of Present Illness:    Shaun [name] is a 63 y.o. male who presents via audio/video conferencing for a telehealth visit today.  The patient is a pleasant 63 year old male who works in Scientist, research (life sciences) and  receiving for [employer].  He has a history of chronic atrial fibrillation on Eliquis, hypertension, non-insulin-dependent diabetes, and coronary disease.  He had a circumflex stent placed in November 2016.  Catheterization in October 2018 revealed a patent circumflex stent and a 50% LAD.  His ejection fraction at that time was 55 to 65%.  In addition to the above he has a history of gastroesophageal reflux disease.  In September 2019 he had a Schatzki's ring dilated.  He had documented gastritis and multiple polyps removed.  In January 2020 he had a small bowel perforation and underwent surgery for that.  He is followed by Shaun [name] in [place].  The patient was seen by Shaun [name] November 13, 2018.  At that time Shaun [name] felt he was volume overloaded and his Lasix was increased.  The patient's contacted today for follow-up.  He tells me that his edema has resolved.  He denies any chest pain or shortness of breath.  He has not used SL NTG. He also told me that his primary care provider had arranged for him to be out of work.  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  The patient does not symptoms concerning for COVID-19 infection (fever, chills, cough, or new SHORTNESS OF BREATH).    Prior CV studies:   The following studies were reviewed today:   Past Medical History:  Diagnosis Date  . Arteriosclerotic cardiovascular disease (ASCVD)    Nonobstructive; cath in 2000: 30-40% mid LAD and  proximal RCA;normal EF. stress nuclear in 2006-subtle inferoseptal hypoperfusion with reversibility; negative stress EKG; good exercise tolerance  . Asthma   . CHF (congestive heart failure) ([place])   . Colon polyps    30 colon polyps found on first colonoscopy  . Diabetes mellitus without complication ([place])   . Diverticulitis   . DJD (degenerative joint disease)   . GERD (gastroesophageal reflux  disease)   . History of kidney stones   . Hyperlipidemia   . Hypertension   . Insomnia   . Obstructive sleep apnea 12/2009   01/26/2010 AHI 83/hr  . Paroxysmal atrial fibrillation ([place]) 10/2004   Onset in 10/2004; recurred 09/2008  . PUD (peptic ulcer disease)    1980s  . RLS (restless legs syndrome)   . Sinusitis    Past Surgical History:  Procedure Laterality Date  . BIOPSY  07/17/2018   Procedure: BIOPSY;  Surgeon: Shaun Binder, MD;  [facility];  Service: Endoscopy;;  colon  . BOWEL RESECTION  09/17/2018   SMALL BOWEL RESECTION: 71 CM   . CARDIAC CATHETERIZATION N/A 07/21/2015   Procedure: Left Heart Cath and Coronary Angiography;  Surgeon: Shaun M Martinique, MD;  Location: [facility] CV LAB;  Service: Cardiovascular;  Laterality: N/A;  . CARDIAC CATHETERIZATION N/A 07/21/2015   Procedure: Coronary Stent Intervention;  Surgeon: Shaun M Martinique, MD;  Location: [facility] CV LAB;  Service: Cardiovascular;  Laterality: N/A;  . COLONOSCOPY N/A 05/19/2014   Dr. Barnie [name] diverticulosis/moderate external hemorrhoids, >20 simple adenomas. Genetic screening negative.   . COLONOSCOPY WITH PROPOFOL N/A 07/17/2018   Shaun [name]: Diverticulosis, external/internal hemorrhoids, 32 colon polyps removed.  ten tubular adenomas removed with no high-grade dysplasia.  Advised to have surveillance colonoscopy in 3 years.  . ESOPHAGOGASTRODUODENOSCOPY (EGD) WITH PROPOFOL N/A 07/17/2018   Shaun [name]: Low-grade narrowing Schatzki ring at the GE junction status post dilation.  Gastritis.  Biopsy with mild nonspecific reactive gastropathy.  No H. pylori.  Marland Kitchen HERNIA REPAIR  1986   Left inguinal  . INTRAVASCULAR PRESSURE WIRE/FFR STUDY Left 06/08/2017   Procedure: INTRAVASCULAR PRESSURE WIRE/FFR STUDY;  Surgeon: Shaun Bush, MD;  Location: [facility] CV LAB;  Service: Cardiovascular;  Laterality: Left;  LAD and CFX  . LAPAROTOMY N/A 09/17/2018   Procedure: EXPLORATORY LAPAROTOMY;  Surgeon:  Shaun Cagey, MD;  Location: [facility] ORS;  Service: General;  Laterality: N/A;  . LEFT HEART CATH AND CORONARY ANGIOGRAPHY N/A 06/08/2017   Procedure: LEFT HEART CATH AND CORONARY ANGIOGRAPHY;  Surgeon: Shaun Bush, MD;  Location: [facility] CV LAB;  Service: Cardiovascular;  Laterality: N/A;  . POLYPECTOMY  07/17/2018   Procedure: POLYPECTOMY;  Surgeon: Shaun Binder, MD;  [facility];  Service: Endoscopy;;  colon  . ROTATOR CUFF REPAIR     Right  . SAVORY DILATION N/A 07/17/2018   Procedure: SAVORY DILATION;  Surgeon: Shaun Binder, MD;  [facility];  Service: Endoscopy;  Laterality: N/A;     Current Meds  Medication Sig  . atorvastatin (LIPITOR) 80 MG tablet TAKE 1 TABLET(80 MG) BY MOUTH EVERY EVENING  . buPROPion (WELLBUTRIN SR) 150 MG 12 hr tablet Take 150 mg by mouth 2 (two) times daily.  Marland Kitchen docusate sodium (COLACE) 100 MG capsule Take 1 capsule (100 mg total) by mouth 2 (two) times daily.  Marland Kitchen ELIQUIS 5 MG TABS tablet TAKE 1 TABLET(5 MG) BY MOUTH TWICE DAILY  . escitalopram (LEXAPRO) 20 MG tablet TAKE 1 TABLET(20 MG) BY MOUTH  DAILY  . furosemide (LASIX) 20 MG tablet Take 2 tablets (40 mg total) by mouth daily.  Marland Kitchen HYDROcodone-acetaminophen (NORCO/VICODIN) 5-325 MG tablet Take one tablet bid prn pain  . iron polysaccharides (NU-IRON) 150 MG capsule 1 PO BID FOR 3 MOS (Patient taking differently: Take 150 mg by mouth daily. 1 PO BID FOR 3 MOS)  . lisinopril (PRINIVIL,ZESTRIL) 5 MG tablet TAKE 1 TABLET(5 MG) BY MOUTH DAILY MUST MAKE APPOINTMENT  . meclizine (ANTIVERT) 25 MG tablet Take 1 tablet (25 mg total) by mouth 3 (three) times daily as needed for dizziness.  . metFORMIN (GLUCOPHAGE) 500 MG tablet TAKE 1 TABLET(500 MG) BY MOUTH TWICE DAILY WITH A MEAL  . metoprolol tartrate (LOPRESSOR) 50 MG tablet TAKE 1 AND 1/2 TABLETS BY MOUTH TWICE DAILY (Patient taking differently: Take 75 mg by mouth 2 (two) times daily. )  . nitroGLYCERIN (NITROSTAT) 0.4 MG SL  tablet Place 1 tablet (0.4 mg total) under the tongue every 5 (five) minutes as needed. (Patient taking differently: Place 0.4 mg under the tongue every 5 (five) minutes as needed for chest pain. )  . omeprazole (PRILOSEC) 40 MG capsule Take 1 capsule (40 mg total) by mouth daily.  . ondansetron (ZOFRAN-ODT) 4 MG disintegrating tablet Take 1 tablet (4 mg total) by mouth every 6 (six) hours as needed for nausea.  . potassium chloride SA (K-DUR,KLOR-CON) 20 MEQ tablet TAKE 1 TABLET BY MOUTH EVERY DAY  . rOPINIRole (REQUIP) 3 MG tablet Take 1 tablet (3 mg total) by mouth at bedtime.  . sildenafil (VIAGRA) 100 MG tablet TAKE 1 TABLET BY MOUTH EVERY DAY AS NEEDED FOR ERECTILE DYSFUNCTION  . sodium chloride (OCEAN) 0.65 % SOLN nasal spray Place 1 spray into both nostrils daily as needed for congestion.   . sucralfate (CARAFATE) 1 g tablet TAKE 1 TABLET BY MOUTH THREE TIMES DAILY BEFORE MEALS  . tamsulosin (FLOMAX) 0.4 MG CAPS capsule Take 1 capsule (0.4 mg total) by mouth daily after breakfast.  . tetrahydrozoline (VISINE) 0.05 % ophthalmic solution Place 2 drops into both eyes daily as needed (for dry eyes).  . traZODone (DESYREL) 50 MG tablet TAKE 2 TABLETS(100 MG) BY MOUTH AT BEDTIME (Patient taking differently: Take 100 mg by mouth at bedtime. )  . triamcinolone cream (KENALOG) 0.1 % Apply 1 application topically 2 (two) times daily. (Patient taking differently: Apply 1 application topically daily as needed (for irritation). )  . VENTOLIN HFA 108 (90 Base) MCG/ACT inhaler INHALE 2 PUFFS INTO THE LUNGS EVERY 6 HOURS AS NEEDED FOR WHEEZING OR SHORTNESS OF BREATH (Patient taking differently: Inhale 2 puffs into the lungs every 6 (six) hours as needed for wheezing or shortness of breath. )     Allergies:   Patient has no known allergies.   Social History   Tobacco Use  . Smoking status: Current Every Day Smoker    Packs/day: 0.50    Years: 32.00    Pack years: 16.00    Types: Cigarettes    Start  date: 07/24/1970    Last attempt to quit: 08/29/2018    Years since quitting: 0.2  . Smokeless tobacco: Never Used  Substance Use Topics  . Alcohol use: No    Alcohol/week: 0.0 standard drinks  . Drug use: No     Family Hx: The patient's family history includes Brain cancer in his maternal uncle; Breast cancer in his cousin; Breast cancer (age of onset: 40) in his mother; Cancer in his cousin and maternal uncle; Diabetes in  his brother; Heart attack in his father; Hypertension in his mother; Skin cancer (age of onset: 23) in his sister. There is no history of Colon cancer.  ROS:   Please see the history of present illness.    All other systems reviewed and are negative.   Labs/Other Tests and Data Reviewed:    Recent Labs: 09/17/2018: ALT 28 09/20/2018: BUN 22; Creatinine, Ser 0.90; Hemoglobin 9.0; Magnesium 2.1; Platelets 244; Potassium 4.4; Sodium 140   Recent Lipid Panel Lab Results  Component Value Date/Time   CHOL 136 07/10/2018 09:23 AM   CHOL 138 10/27/2016 08:16 AM   TRIG 247 (H) 07/10/2018 09:23 AM   HDL 28 (L) 07/10/2018 09:23 AM   HDL 29 (L) 10/27/2016 08:16 AM   CHOLHDL 4.9 07/10/2018 09:23 AM   LDLCALC 59 07/10/2018 09:23 AM   LDLCALC 75 12/15/2017 07:44 AM    Wt Readings from Last 3 Encounters:  12/12/18 230 lb (104.3 kg)  11/08/18 233 lb (105.7 kg)  10/25/18 232 lb (105.2 kg)     Exam:    Vital Signs:  BP 106/82   Pulse 76   Ht 5\' 8"  (1.727 m)   Wt 230 lb (104.3 kg)   BMI 34.97 kg/m     ASSESSMENT & PLAN:    Chronic diastolic CHF (congestive heart failure) (HCC) Pt sen in the ED 06/12/17 with dyspnea and felt to have diastolic CHF (BNP was 741). Lasix recently increased for LE edema.  No symptoms currently.   CAD S/P percutaneous coronary angioplasty [place] Nov 2016- cath [name] lesion-DES Re look cath for chest pain 06/08/17- non obstructive CAD  Chronic atrial fibrillation ([place]) onset in 10/2004; recurred 09/2008, now permanent  Chronic  anticoagulation Eliquis. CHA2DS2 VASc=3  Hypertension EF 60-65% with moderate LVH, (unable to evaluate diastolic dysfunction), and severe LAE on echo Oct 2018  Obstructive sleep apnea Compliant with C-pap  Non-insulin treated type 2 diabetes mellitus ([place]) Followed by PCP  COVID-19 Education: The signs and symptoms of COVID-19 were discussed with the patient and how to seek care for testing (follow up with PCP or arrange E-visit).  The importance of social distancing was discussed today.  Patient Risk:   After full review of this patients clinical status, I feel that they are at least moderate risk at this time.  Time:   Today, I have spent 15 minutes with the patient with telehealth technology discussing CHF.     Medication Adjustments/Labs and Tests Ordered: Current medicines are reviewed at length with the patient today.  Concerns regarding medicines are outlined above.  Tests Ordered: No orders of the defined types were placed in this encounter.  Medication Changes: No orders of the defined types were placed in this encounter.   Disposition:  Pt asked for a refill of his Viagra which I provided.  He needs a BMP and CBC.  F/U with Dr Shaun [name] in 3-4 months.   Angelena Form, PA-C  12/12/2018 9:01 AM    [place]

## 2018-12-14 DIAGNOSIS — I482 Chronic atrial fibrillation, unspecified: Secondary | ICD-10-CM | POA: Diagnosis not present

## 2018-12-14 DIAGNOSIS — Z9861 Coronary angioplasty status: Secondary | ICD-10-CM | POA: Diagnosis not present

## 2018-12-14 DIAGNOSIS — I2510 Atherosclerotic heart disease of native coronary artery without angina pectoris: Secondary | ICD-10-CM | POA: Diagnosis not present

## 2018-12-14 LAB — CBC
Hematocrit: 35 % — ABNORMAL LOW (ref 37.5–51.0)
Hemoglobin: 10.9 g/dL — ABNORMAL LOW (ref 13.0–17.7)
MCH: 23.4 pg — ABNORMAL LOW (ref 26.6–33.0)
MCHC: 31.1 g/dL — ABNORMAL LOW (ref 31.5–35.7)
MCV: 75 fL — ABNORMAL LOW (ref 79–97)
Platelets: 186 10*3/uL (ref 150–450)
RBC: 4.66 x10E6/uL (ref 4.14–5.80)
RDW: 17.2 % — ABNORMAL HIGH (ref 11.6–15.4)
WBC: 7.2 10*3/uL (ref 3.4–10.8)

## 2018-12-14 LAB — BASIC METABOLIC PANEL
BUN/Creatinine Ratio: 10 (ref 10–24)
BUN: 9 mg/dL (ref 8–27)
CO2: 20 mmol/L (ref 20–29)
Calcium: 8.9 mg/dL (ref 8.6–10.2)
Chloride: 104 mmol/L (ref 96–106)
Creatinine, Ser: 0.9 mg/dL (ref 0.76–1.27)
GFR calc Af Amer: 105 mL/min/{1.73_m2} (ref >59)
GFR calc non Af Amer: 91 mL/min/{1.73_m2} (ref >59)
Glucose: 118 mg/dL — ABNORMAL HIGH (ref 65–99)
Potassium: 4.1 mmol/L (ref 3.5–5.2)
Sodium: 140 mmol/L (ref 134–144)

## 2018-12-19 ENCOUNTER — Telehealth: Payer: Self-pay | Admitting: Family Medicine

## 2018-12-19 DIAGNOSIS — Z029 Encounter for administrative examinations, unspecified: Secondary | ICD-10-CM

## 2018-12-19 NOTE — Telephone Encounter (Signed)
Patient brought in FMLA to be filled out but was last seen 10/09/18.He states was told by you over the phone that he could get a letter stating needing to be out of work for the next 45 days and now his job is wanting to to fill out FMLA for being out for Covid 19 their are no notes for me to go by only a letter that attacked to paper work. Please advise. In your yellow folder.

## 2018-12-22 ENCOUNTER — Other Ambulatory Visit: Payer: Self-pay | Admitting: Family Medicine

## 2018-12-25 ENCOUNTER — Other Ambulatory Visit: Payer: Self-pay | Admitting: Family Medicine

## 2018-12-25 DIAGNOSIS — M1611 Unilateral primary osteoarthritis, right hip: Secondary | ICD-10-CM | POA: Diagnosis not present

## 2018-12-26 NOTE — Telephone Encounter (Signed)
Six mo worth 

## 2018-12-27 ENCOUNTER — Other Ambulatory Visit: Payer: Self-pay | Admitting: Cardiology

## 2018-12-27 DIAGNOSIS — I5032 Chronic diastolic (congestive) heart failure: Secondary | ICD-10-CM

## 2018-12-27 MED ORDER — FUROSEMIDE 20 MG PO TABS: 40.0000 mg | ORAL_TABLET | Freq: Every day | ORAL | 1 refills | Status: DC

## 2018-12-27 NOTE — Telephone Encounter (Signed)
Furosemide 20 mg refilled. 

## 2018-12-27 NOTE — Telephone Encounter (Signed)
°*  STAT* If patient is at the pharmacy, call can be transferred to refill team.   1. Which medications need to be refilled? (please list name of each medication and dose if known) furosemide (LASIX) 20 MG tablet  2. Which pharmacy/location (including street and city if local pharmacy) is medication to be sent to? [id number] - [place], [place] - [address]  3. Do they need a 30 day or 90 day supply? 90 days   Patient has run out of medication

## 2018-12-31 ENCOUNTER — Telehealth: Payer: Self-pay | Admitting: Family Medicine

## 2018-12-31 MED ORDER — HYDROCODONE-ACETAMINOPHEN 5-325 MG PO TABS: ORAL_TABLET | ORAL | 0 refills | Status: DC

## 2018-12-31 NOTE — Telephone Encounter (Signed)
Ok times one 

## 2018-12-31 NOTE — Telephone Encounter (Signed)
done

## 2018-12-31 NOTE — Telephone Encounter (Signed)
Patient notified

## 2018-12-31 NOTE — Telephone Encounter (Signed)
Patient is requesting refill on hydrocodone 5/325

## 2018-12-31 NOTE — Telephone Encounter (Signed)
Med pended ready for signature then will call and let pt know

## 2019-01-05 ENCOUNTER — Other Ambulatory Visit: Payer: Self-pay | Admitting: Family Medicine

## 2019-01-07 ENCOUNTER — Other Ambulatory Visit: Payer: Self-pay | Admitting: General Surgery

## 2019-01-16 ENCOUNTER — Other Ambulatory Visit: Payer: Self-pay

## 2019-01-16 ENCOUNTER — Ambulatory Visit: Payer: 59 | Admitting: Family Medicine

## 2019-01-16 ENCOUNTER — Encounter: Payer: Self-pay | Admitting: Family Medicine

## 2019-01-16 VITALS — BP 118/80 | Temp 98.3°F | Ht 68.0 in | Wt 232.0 lb

## 2019-01-16 DIAGNOSIS — H9190 Unspecified hearing loss, unspecified ear: Secondary | ICD-10-CM

## 2019-01-16 DIAGNOSIS — E119 Type 2 diabetes mellitus without complications: Secondary | ICD-10-CM | POA: Diagnosis not present

## 2019-01-16 DIAGNOSIS — Z79891 Long term (current) use of opiate analgesic: Secondary | ICD-10-CM | POA: Diagnosis not present

## 2019-01-16 DIAGNOSIS — H9319 Tinnitus, unspecified ear: Secondary | ICD-10-CM

## 2019-01-16 DIAGNOSIS — J111 Influenza due to unidentified influenza virus with other respiratory manifestations: Secondary | ICD-10-CM

## 2019-01-16 DIAGNOSIS — F339 Major depressive disorder, recurrent, unspecified: Secondary | ICD-10-CM

## 2019-01-16 DIAGNOSIS — I10 Essential (primary) hypertension: Secondary | ICD-10-CM

## 2019-01-16 MED ORDER — HYDROCODONE-ACETAMINOPHEN 5-325 MG PO TABS: ORAL_TABLET | ORAL | 0 refills | Status: DC

## 2019-01-16 NOTE — Progress Notes (Signed)
   Subjective:    Patient ID: Shaun [name], male    DOB: March 31, 1956, 63 y.o.   MRN: [id number]  HPI This patient was seen today for chronic pain. Takes for right hip pain  The medication list was reviewed and updated.   -Compliance with medication: takes one bid  - Number patient states they take daily: 2  -when was the last dose patient took? yesterday  The patient was advised the importance of maintaining medication and not using illegal substances with these.  Here for refills and follow up  The patient was educated that we can provide 3 monthly scripts for their medication, it is their responsibility to follow the instructions.  Side effects or complications from medications: none  Patient is aware that pain medications are meant to minimize the severity of the pain to allow their pain levels to improve to allow for better function. They are aware of that pain medications cannot totally remove their pain.  Due for UDT ( at least once per year) : last one 10/09/18  Pt states no concerns today.   Patient notes pain has worsened substantially.  Feels that he needs to take more pain medicine.  Also notes progressive hearing in both ears.  Worse over the past year.  Now with accompanying difficulty hearing   Patient claims compliance with diabetes medication. No obvious side effects. Reports no substantial low sugar spells. Most numbers are generally in good range when checked fasting. Generally does not miss a dose of medication. Watching diabetic diet closely  Patient compliant with insomnia medication. Generally takes most nights. No obvious morning drowsiness. Definitely helps patient sleep. Without it patient states would not get a good nights rest.      Review of Systems No headache, no major weight loss or weight gain, no chest pain no back pain abdominal pain no change in bowel habits complete ROS otherwise negative     Objective:   Physical Exam  Alert and  oriented, vitals reviewed and stable, NAD ENT-TM's and ext canals WNL bilat via otoscopic exam Soft palate, tonsils and post pharynx WNL via oropharyngeal exam Neck-symmetric, no masses; thyroid nonpalpable and nontender Pulmonary-no tachypnea or accessory muscle use; Clear without wheezes via auscultation Card--no abnrml murmurs, irregular rhythm not reg and controlled rate WNL Carotid pulses symmetric, without bruits       Assessment & Plan:  Impression 1 chronic pain.  Suboptimum management discussed will increase narcotics rationale discussed.  2.  Progressive tinnitus with hearing loss discussed at length time for ENT referral  3.  Type 2 diabetes disease control discussed maintain same  4.  Chronic atrial fibrillation decent control of heart rate.  5.  General disability concerns patient really is pretty much worn out with all his chronic difficulties.  I supported 100% is desire to attain disabled declaration.  I hope the patient will qualify because he should  6.  Obstructive sleep apnea ongoing utilizes machine faithfully  7.  Handicap placard discussion would like 1 discussed we will go ahead and intervene with this  Greater than 50% of this 40 minute face to face visit was spent in counseling and discussion and coordination of care regarding the above diagnosis/diagnosies

## 2019-01-17 ENCOUNTER — Ambulatory Visit: Payer: 59 | Admitting: Family Medicine

## 2019-01-23 ENCOUNTER — Other Ambulatory Visit: Payer: Self-pay | Admitting: Family Medicine

## 2019-01-23 ENCOUNTER — Telehealth: Payer: Self-pay | Admitting: Family Medicine

## 2019-01-23 NOTE — Telephone Encounter (Signed)
Patient brought in [place] back in  April and was out of work for 45 days and now has to go back on Sunday 5/30 and he is having no issues but wants to know if its ok to return to work. Please advise.

## 2019-01-23 NOTE — Telephone Encounter (Signed)
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 I would advise he not do it, but I will honor his choices. The choice for many is econmic and I understand that, but from a health standpoint it is a risky proposition. we can extend this another 90 d

## 2019-01-23 NOTE — Telephone Encounter (Signed)
Please advise 

## 2019-01-23 NOTE — Telephone Encounter (Signed)
I spoke with the patient he states he would like to extend it for another 90 days please.

## 2019-01-25 NOTE — Telephone Encounter (Signed)
Letter for work ready for pickup and pt notified.

## 2019-02-03 ENCOUNTER — Other Ambulatory Visit: Payer: Self-pay | Admitting: Family Medicine

## 2019-02-11 ENCOUNTER — Encounter: Payer: Self-pay | Admitting: Family Medicine

## 2019-02-18 ENCOUNTER — Other Ambulatory Visit: Payer: Self-pay | Admitting: Family Medicine

## 2019-02-20 ENCOUNTER — Other Ambulatory Visit: Payer: Self-pay | Admitting: Family Medicine

## 2019-03-04 ENCOUNTER — Other Ambulatory Visit: Payer: Self-pay | Admitting: Cardiology

## 2019-03-04 NOTE — Telephone Encounter (Signed)
Rx(s) sent to pharmacy electronically.  

## 2019-03-15 ENCOUNTER — Ambulatory Visit (INDEPENDENT_AMBULATORY_CARE_PROVIDER_SITE_OTHER): Payer: 59 | Admitting: Urology

## 2019-03-15 ENCOUNTER — Other Ambulatory Visit: Payer: Self-pay

## 2019-03-15 DIAGNOSIS — N471 Phimosis: Secondary | ICD-10-CM

## 2019-03-16 ENCOUNTER — Other Ambulatory Visit: Payer: Self-pay | Admitting: Family Medicine

## 2019-03-19 ENCOUNTER — Telehealth: Payer: Self-pay | Admitting: Cardiology

## 2019-03-19 ENCOUNTER — Other Ambulatory Visit: Payer: Self-pay | Admitting: Urology

## 2019-03-19 ENCOUNTER — Other Ambulatory Visit: Payer: Self-pay | Admitting: Cardiology

## 2019-03-19 NOTE — Telephone Encounter (Signed)
° °  [facility] Pre-operative Risk Assessment    Request for surgical clearance:  1. What type of surgery is being performed?  Circumcision   2. When is this surgery scheduled? TBD   3. What type of clearance is required (medical clearance vs. Pharmacy clearance to hold med vs. Both)? Both*  4. Are there any medications that need to be held prior to surgery and how long? Eliquis   5. Practice name and name of physician performing surgery? Dr Irine Seal   6. What is your office phone number [phone]    7.   What is your office fax number [phone]  8.   Anesthesia type (None, local, MAC, general) ? General   Shaun [name] 03/19/2019, 9:33 AM  _________________________________________________________________   (provider comments below)

## 2019-03-19 NOTE — Telephone Encounter (Signed)
Patient with diagnosis of Afib on Eliquis for anticoagulation.    Procedure: Circumcision  Date of procedure: TBD  CHADS2-VASc score of  4 (CHF, HTN,  DM2, CAD)  CrCl 100 ml/min  Per office protocol, patient can hold Eliquis for 1-2 days prior to procedure.

## 2019-03-19 NOTE — Telephone Encounter (Signed)
   Primary Cardiologist: Kirk Ruths, MD  Chart reviewed as part of pre-operative protocol coverage. Patient was contacted 03/19/2019 in reference to pre-operative risk assessment for pending surgery as outlined below.  Shaun [name] was last seen on 12/12/2018 by Kerin Ransom, PA. At that time,  Shaun [name] was doing well from a cardiac perspective. His previous edema had improved and he was without complaints of chest pain or SOB. He had not used his SL NTG. Last cath 06/08/2017 reassuring with non-obstructive CAD. His AF was noted to ne controlled and he was compliant with his Eliquis.   Per pharmacy: Procedure: Circumcision Date of procedure: TBD  CHADS2-VASc score of  4 (CHF, HTN,  DM2, CAD)  CrCl 100 ml/min  Per office protocol and pharmacy recommendations, patient can hold Eliquis for 1-2 days prior to procedure.  Therefore, based on ACC/AHA guidelines, the patient would be at acceptable risk for the planned procedure without further cardiovascular testing.   I will route this recommendation to the requesting party via Epic fax function and remove from pre-op pool.  Please call with questions.  Kathyrn Drown, NP 03/19/2019, 1:09 PM

## 2019-03-22 ENCOUNTER — Other Ambulatory Visit: Payer: Self-pay | Admitting: Cardiology

## 2019-03-22 ENCOUNTER — Telehealth: Payer: Self-pay | Admitting: Family Medicine

## 2019-03-22 DIAGNOSIS — I5032 Chronic diastolic (congestive) heart failure: Secondary | ICD-10-CM

## 2019-03-22 NOTE — Telephone Encounter (Signed)
Patient called [facility] on [address] and they said he needed approval to fill his hydrocodone.  I show he should have one refill left.  Patient said he talked to someone and didn't do the automated refill thing and they said he needed to call us.  Next appt is august 12th.

## 2019-03-22 NOTE — Telephone Encounter (Signed)
Called [facility] and they did have the script on file and told me they would get it ready for him. Pt notified.

## 2019-03-25 NOTE — Progress Notes (Signed)
HPI: FU permanent atrial fibrillation, coronary artery disease, diabetes mellitus and hypertension. Patient is status post PCI of his circumflex in November 2016. Patient was seen in atrial fibrillation clinic in June 2018 to see if there were options for restoring sinus rhythm. Given long-standing atrial fibrillation and left atrial enlargement it was felt that rate control and anticoagulation indicated.  Cardiac catheterization October 2018 showed a 30% distal circumflex.  LV function was normal with normal LV filling pressure.  Echocardiogram October 2018 showed normal LV function.  There was severe left atrial enlargement.  Also with chronic diastolic CHF. Since last seen,patient states that when he ambulates he feels his heart rate race which causes him to be fatigued and dyspneic.  He does not have orthopnea, PND or pedal edema.  No chest pain or syncope.  Current Outpatient Medications  Medication Sig Dispense Refill   atorvastatin (LIPITOR) 80 MG tablet TAKE 1 TABLET(80 MG) BY MOUTH EVERY EVENING 90 tablet 1   buPROPion (WELLBUTRIN SR) 150 MG 12 hr tablet Take 150 mg by mouth 2 (two) times daily.     docusate sodium (COLACE) 100 MG capsule Take 1 capsule (100 mg total) by mouth 2 (two) times daily. 60 capsule 2   ELIQUIS 5 MG TABS tablet TAKE 1 TABLET(5 MG) BY MOUTH TWICE DAILY 60 tablet 6   escitalopram (LEXAPRO) 20 MG tablet TAKE 1 TABLET(20 MG) BY MOUTH DAILY 30 tablet 5   furosemide (LASIX) 20 MG tablet TAKE 2 TABLETS(40 MG) BY MOUTH DAILY 90 tablet 1   HYDROcodone-acetaminophen (NORCO/VICODIN) 5-325 MG tablet Take one tablet tid prn pain 90 tablet 0   HYDROcodone-acetaminophen (NORCO/VICODIN) 5-325 MG tablet Take one tid prn pain 90 tablet 0   HYDROcodone-acetaminophen (NORCO/VICODIN) 5-325 MG tablet Take one tid prn pain 90 tablet 0   iron polysaccharides (NU-IRON) 150 MG capsule 1 PO BID FOR 3 MOS (Patient taking differently: Take 150 mg by mouth daily. 1 PO BID FOR 3  MOS) 90 capsule 0   lisinopril (PRINIVIL,ZESTRIL) 5 MG tablet TAKE 1 TABLET(5 MG) BY MOUTH DAILY MUST MAKE APPOINTMENT 30 tablet 3   meclizine (ANTIVERT) 25 MG tablet Take 1 tablet (25 mg total) by mouth 3 (three) times daily as needed for dizziness. 30 tablet 0   metFORMIN (GLUCOPHAGE) 500 MG tablet TAKE 1 TABLET(500 MG) BY MOUTH TWICE DAILY WITH A MEAL 60 tablet 5   metoprolol tartrate (LOPRESSOR) 50 MG tablet TAKE 1 AND 1/2 TABLETS BY MOUTH TWICE DAILY 270 tablet 1   nitroGLYCERIN (NITROSTAT) 0.4 MG SL tablet Place 1 tablet (0.4 mg total) under the tongue every 5 (five) minutes as needed. (Patient taking differently: Place 0.4 mg under the tongue every 5 (five) minutes as needed for chest pain. ) 25 tablet 3   omeprazole (PRILOSEC) 40 MG capsule Take 1 capsule (40 mg total) by mouth daily. 30 capsule 5   ondansetron (ZOFRAN-ODT) 4 MG disintegrating tablet Take 1 tablet (4 mg total) by mouth every 6 (six) hours as needed for nausea. 20 tablet 0   potassium chloride SA (K-DUR) 20 MEQ tablet Take 1 tablet (20 mEq total) by mouth daily. 90 tablet 2   rOPINIRole (REQUIP) 3 MG tablet TAKE 1 TABLET(3 MG) BY MOUTH AT BEDTIME 90 tablet 0   sildenafil (VIAGRA) 100 MG tablet Take 1 tablet 30 minutes before activity 10 tablet 3   sodium chloride (OCEAN) 0.65 % SOLN nasal spray Place 1 spray into both nostrils daily as needed for congestion.  sucralfate (CARAFATE) 1 g tablet TAKE 1 TABLET BY MOUTH THREE TIMES DAILY BEFORE MEALS 42 tablet 1   tamsulosin (FLOMAX) 0.4 MG CAPS capsule Take 1 capsule (0.4 mg total) by mouth daily after breakfast. 30 capsule 2   tetrahydrozoline (VISINE) 0.05 % ophthalmic solution Place 2 drops into both eyes daily as needed (for dry eyes).     traZODone (DESYREL) 50 MG tablet TAKE 2 TABLETS(100 MG) BY MOUTH AT BEDTIME 60 tablet 2   triamcinolone cream (KENALOG) 0.1 % Apply 1 application topically 2 (two) times daily. (Patient taking differently: Apply 1  application topically daily as needed (for irritation). ) 60 g 1   VENTOLIN HFA 108 (90 Base) MCG/ACT inhaler INHALE 2 PUFFS INTO THE LUNGS EVERY 6 HOURS AS NEEDED FOR WHEEZING OR SHORTNESS OF BREATH (Patient taking differently: Inhale 2 puffs into the lungs every 6 (six) hours as needed for wheezing or shortness of breath. ) 18 g 0   No current facility-administered medications for this visit.      Past Medical History:  Diagnosis Date   Arteriosclerotic cardiovascular disease (ASCVD)    Nonobstructive; cath in 2000: 30-40% mid LAD and proximal RCA;normal EF. stress nuclear in 2006-subtle inferoseptal hypoperfusion with reversibility; negative stress EKG; good exercise tolerance   Asthma    CHF (congestive heart failure) (HCC)    Colon polyps    30 colon polyps found on first colonoscopy   Diabetes mellitus without complication (HCC)    Diverticulitis    DJD (degenerative joint disease)    GERD (gastroesophageal reflux disease)    History of kidney stones    Hyperlipidemia    Hypertension    Insomnia    Obstructive sleep apnea 12/2009   01/26/2010 AHI 83/hr   Paroxysmal atrial fibrillation ([place]) 10/2004   Onset in 10/2004; recurred 09/2008   PUD (peptic ulcer disease)    1980s   RLS (restless legs syndrome)    Sinusitis     Past Surgical History:  Procedure Laterality Date   BIOPSY  07/17/2018   Procedure: BIOPSY;  Surgeon: Danie Binder, MD;  [facility];  Service: Endoscopy;;  colon   BOWEL RESECTION  09/17/2018   SMALL BOWEL RESECTION: 71 CM    CARDIAC CATHETERIZATION N/A 07/21/2015   Procedure: Left Heart Cath and Coronary Angiography;  Surgeon: Peter M Martinique, MD;  Location: [place] [facility];  Service: Cardiovascular;  Laterality: N/A;   CARDIAC CATHETERIZATION N/A 07/21/2015   Procedure: Coronary Stent Intervention;  Surgeon: Peter M Martinique, MD;  Location: [place] [facility];  Service: Cardiovascular;  Laterality: N/A;   COLONOSCOPY  N/A 05/19/2014   Dr. Barnie Alderman diverticulosis/moderate external hemorrhoids, >20 simple adenomas. Genetic screening negative.    COLONOSCOPY WITH PROPOFOL N/A 07/17/2018   Dr. Oneida Alar: Diverticulosis, external/internal hemorrhoids, 32 colon polyps removed.  ten tubular adenomas removed with no high-grade dysplasia.  Advised to have surveillance colonoscopy in 3 years.   ESOPHAGOGASTRODUODENOSCOPY (EGD) WITH PROPOFOL N/A 07/17/2018   Dr. Oneida Alar: Low-grade narrowing Schatzki ring at the GE junction status post dilation.  Gastritis.  Biopsy with mild nonspecific reactive gastropathy.  No H. pylori.   HERNIA REPAIR  1986   Left inguinal   INTRAVASCULAR PRESSURE WIRE/FFR STUDY Left 06/08/2017   Procedure: INTRAVASCULAR PRESSURE WIRE/FFR STUDY;  Surgeon: Nelva Bush, MD;  Location: [place] [facility];  Service: Cardiovascular;  Laterality: Left;  LAD and CFX   LAPAROTOMY N/A 09/17/2018   Procedure: EXPLORATORY LAPAROTOMY;  Surgeon: Virl Cagey, MD;  Location: [facility];  Service: General;  Laterality: N/A;   LEFT HEART CATH AND CORONARY ANGIOGRAPHY N/A 06/08/2017   Procedure: LEFT HEART CATH AND CORONARY ANGIOGRAPHY;  Surgeon: Nelva Bush, MD;  Location: [place] [facility];  Service: Cardiovascular;  Laterality: N/A;   POLYPECTOMY  07/17/2018   Procedure: POLYPECTOMY;  Surgeon: Danie Binder, MD;  [facility];  Service: Endoscopy;;  colon   ROTATOR CUFF REPAIR     Right   SAVORY DILATION N/A 07/17/2018   Procedure: SAVORY DILATION;  Surgeon: Danie Binder, MD;  [facility];  Service: Endoscopy;  Laterality: N/A;    Social History   Socioeconomic History   Marital status: Married    Spouse name: Not on file   Number of children: 5   Years of education: Not on file   Highest education level: Not on file  Occupational History   Occupation: employed    Employer: [employer]: full-time  Scientist, product/process development strain:  Not on file   Food insecurity    Worry: Not on file    Inability: Not on file   Transportation needs    Medical: Not on file    Non-medical: Not on file  Tobacco Use   Smoking status: Current Every Day Smoker    Packs/day: 0.50    Years: 32.00    Pack years: 16.00    Types: Cigarettes    Start date: 07/24/1970    Last attempt to quit: 08/29/2018    Years since quitting: 0.5   Smokeless tobacco: Never Used  Substance and Sexual Activity   Alcohol use: No    Alcohol/week: 0.0 standard drinks   Drug use: No   Sexual activity: Yes    Partners: Female  Lifestyle   Physical activity    Days per week: Not on file    Minutes per session: Not on file   Stress: Not on file  Relationships   Social connections    Talks on phone: Not on file    Gets together: Not on file    Attends religious service: Not on file    Active member of club or organization: Not on file    Attends meetings of clubs or organizations: Not on file    Relationship status: Not on file   Intimate partner violence    Fear of current or ex partner: Not on file    Emotionally abused: Not on file    Physically abused: Not on file    Forced sexual activity: Not on file  Other Topics Concern   Not on file  Social History Narrative   Not on file    Family History  Problem Relation Age of Onset   Hypertension Mother    Breast cancer Mother 75       brain/bone    Heart attack Father    Diabetes Brother    Skin cancer Sister 65   Brain cancer Maternal Uncle    Cancer Maternal Uncle        NOS   Breast cancer Cousin        maternal cousin dx <50   Cancer Cousin    Colon cancer Neg Hx     ROS: Back pain and fatigue but no fevers or chills, productive cough, hemoptysis, dysphasia, odynophagia, melena, hematochezia, dysuria, hematuria, rash, seizure activity, orthopnea, PND, pedal edema, claudication. Remaining systems are negative.  Physical Exam: Well-developed well-nourished in no  acute  distress.  Skin is warm and dry.  HEENT is normal.  Neck is supple.  Chest is clear to auscultation with normal expansion.  Cardiovascular exam is irregular Abdominal exam nontender or distended. No masses palpated. Extremities show no edema. neuro grossly intact  ECG-atrial fibrillation at a rate of 88, no ST changes.  Personally reviewed  A/P  1 permanent atrial fibrillation-patient complains of elevated heart rate with ambulation which leads to fatigue and dyspnea on exertion.  I will increase metoprolol to 100 mg twice daily.  In 1 to 2 weeks we will check 24-hour Holter monitor to make sure that he is adequately controlled.  Continue apixaban.  I will repeat echocardiogram.  Check BNP given dyspnea.  2 coronary artery disease-plan medical therapy as patient is not having symptoms. Continue statin.  He is not on aspirin given need for apixaban.  3 hyperlipidemia-continue statin.  Check lipids and liver.  4 hypertension-patient's blood pressure is controlled.  Occasional orthostatic symptoms.  Since we are increasing his metoprolol I will decrease lisinopril to 2.5 mg daily.  Follow blood pressure and adjust regimen as needed.  5 chronic diastolic congestive heart failure-patient appears to be doing well from a symptomatic standpoint.  He is not volume overloaded on examination.  Continue present dose of diuretic.  Check BNP given dyspnea on exertion.  6 tobacco abuse-patient counseled on discontinuing.  7 obstructive sleep apnea-continue CPAP.  Kirk Ruths, MD

## 2019-03-28 ENCOUNTER — Other Ambulatory Visit: Payer: Self-pay

## 2019-03-28 ENCOUNTER — Telehealth: Payer: Self-pay | Admitting: *Deleted

## 2019-03-28 ENCOUNTER — Encounter: Payer: Self-pay | Admitting: Cardiology

## 2019-03-28 ENCOUNTER — Ambulatory Visit (INDEPENDENT_AMBULATORY_CARE_PROVIDER_SITE_OTHER): Payer: 59 | Admitting: Cardiology

## 2019-03-28 VITALS — BP 124/74 | HR 88 | Temp 98.2°F | Ht 68.0 in | Wt 227.0 lb

## 2019-03-28 DIAGNOSIS — R0600 Dyspnea, unspecified: Secondary | ICD-10-CM

## 2019-03-28 DIAGNOSIS — I2510 Atherosclerotic heart disease of native coronary artery without angina pectoris: Secondary | ICD-10-CM

## 2019-03-28 DIAGNOSIS — E785 Hyperlipidemia, unspecified: Secondary | ICD-10-CM | POA: Diagnosis not present

## 2019-03-28 DIAGNOSIS — I4821 Permanent atrial fibrillation: Secondary | ICD-10-CM

## 2019-03-28 DIAGNOSIS — I10 Essential (primary) hypertension: Secondary | ICD-10-CM

## 2019-03-28 DIAGNOSIS — Z9861 Coronary angioplasty status: Secondary | ICD-10-CM

## 2019-03-28 LAB — HEPATIC FUNCTION PANEL
ALT: 20 IU/L (ref 0–44)
AST: 18 IU/L (ref 0–40)
Albumin: 4.3 g/dL (ref 3.8–4.8)
Alkaline Phosphatase: 111 IU/L (ref 39–117)
Bilirubin Total: 0.4 mg/dL (ref 0.0–1.2)
Bilirubin, Direct: 0.13 mg/dL (ref 0.00–0.40)
Total Protein: 6.5 g/dL (ref 6.0–8.5)

## 2019-03-28 LAB — LIPID PANEL
Chol/HDL Ratio: 5 ratio (ref 0.0–5.0)
Cholesterol, Total: 134 mg/dL (ref 100–199)
HDL: 27 mg/dL — ABNORMAL LOW (ref >39)
LDL Calculated: 67 mg/dL (ref 0–99)
Triglycerides: 202 mg/dL — ABNORMAL HIGH (ref 0–149)
VLDL Cholesterol Cal: 40 mg/dL (ref 5–40)

## 2019-03-28 LAB — PRO B NATRIURETIC PEPTIDE: NT-Pro BNP: 177 pg/mL (ref 0–210)

## 2019-03-28 MED ORDER — METOPROLOL TARTRATE 100 MG PO TABS: 100.0000 mg | ORAL_TABLET | Freq: Two times a day (BID) | ORAL | 3 refills | Status: DC

## 2019-03-28 MED ORDER — LISINOPRIL 2.5 MG PO TABS: 2.5000 mg | ORAL_TABLET | Freq: Every day | ORAL | 3 refills | Status: DC

## 2019-03-28 NOTE — Patient Instructions (Signed)
Medication Instructions:  INCREASE METOPROLOL TO 100 MG TWICE DAILY=2 OF THE 50 MG TABLETS TWICE DAILY  DECREASE LISINOPRIL TO 2.5 MG ONCE DAILY= 1/2 OF THE 5 MG TABLET ONCE DAILY  If you need a refill on your cardiac medications before your next appointment, please call your pharmacy.   Lab work: Your physician recommends that you HAVE LAB WORK TODAY If you have labs (blood work) drawn today and your tests are completely normal, you will receive your results only by: [facility] MyChart Message (if you have MyChart) OR . A paper copy in the mail If you have any lab test that is abnormal or we need to change your treatment, we will call you to review the results.  Testing/Procedures: Your physician has requested that you have an echocardiogram. Echocardiography is a painless test that uses sound waves to create images of your heart. It provides your doctor with information about the size and shape of your heart and how well your heart's chambers and valves are working. This procedure takes approximately one hour. There are no restrictions for this procedure.[place] has recommended that you wear a 24 HOURholter monitor. Holter monitors are medical devices that record the heart's electrical activity. Doctors most often use these monitors to diagnose arrhythmias. Arrhythmias are problems with the speed or rhythm of the heartbeat. The monitor is a small, portable device. You can wear one while you do your normal daily activities. This is usually used to diagnose what is causing palpitations/syncope (passing out).SCHEDULE IN 2 WEEKS  Follow-Up: At [facility], you and your health needs are our priority.  As part of our continuing mission to provide you with exceptional heart care, we have created designated Provider Care Teams.  These Care Teams include your primary Cardiologist (physician) and Advanced Practice Providers (APPs -  Physician Assistants and Nurse Practitioners)  who all work together to provide you with the care you need, when you need it. . Your physician recommends that you schedule a follow-up appointment in: [place] physician recommends that you schedule a follow-up appointment in: [place]

## 2019-03-28 NOTE — Telephone Encounter (Signed)
3 day ZIO XT long term holter monitor to be mailed to patients home.  Patient to apply monitor in 2 weeks (start date 04/12/2019), and wear at least 24 hours.  Instructions briefly reviewed as they are included in the monitor kit.

## 2019-03-29 ENCOUNTER — Encounter: Payer: Self-pay | Admitting: *Deleted

## 2019-04-01 NOTE — Patient Instructions (Signed)
Shaun [name]  04/01/2019     @PREFPERIOPPHARMACY @   Your procedure is scheduled on 04/05/19  Report to [facility] at 10:45 A.M.  Call this number if you have problems the morning of surgery:  [phone]   Remember:  Do not eat or drink after midnight.      Take these medicines the morning of surgery with A SIP OF WATER Wellbutrin, Lexapro, Norco, Lopressor, Prilosec, Requip, Carafate, Flomax    Do not wear jewelry, make-up or nail polish.  Do not wear lotions, powders, or perfumes, or deodorant.  Do not shave 48 hours prior to surgery.  Men may shave face and neck.  Do not bring valuables to the hospital.  [facility] is not responsible for any belongings or valuables.  Contacts, dentures or bridgework may not be worn into surgery.  Leave your suitcase in the car.  After surgery it may be brought to your room.  For patients admitted to the hospital, discharge time will be determined by your treatment team.  Patients discharged the day of surgery will not be allowed to drive home.    Please read over the following fact sheets that you were given. Surgical Site Infection Prevention and Anesthesia Post-op Instructions     PATIENT INSTRUCTIONS POST-ANESTHESIA  IMMEDIATELY FOLLOWING SURGERY:  Do not drive or operate machinery for the first twenty four hours after surgery.  Do not make any important decisions for twenty four hours after surgery or while taking narcotic pain medications or sedatives.  If you develop intractable nausea and vomiting or a severe headache please notify your doctor immediately.  FOLLOW-UP:  Please make an appointment with your surgeon as instructed. You do not need to follow up with anesthesia unless specifically instructed to do so.  WOUND CARE INSTRUCTIONS (if applicable):  Keep a dry clean dressing on the anesthesia/puncture wound site if there is drainage.  Once the wound has quit draining you may leave it open to air.  Generally you should  leave the bandage intact for twenty four hours unless there is drainage.  If the epidural site drains for more than 36-48 hours please call the anesthesia department.  QUESTIONS?:  Please feel free to call your physician or the hospital operator if you have any questions, and they will be happy to assist you.      Circumcision, Adult  Circumcision is a surgery to remove the foreskin of the penis or to cut the foreskin so the space between skin and the tip of the penis is larger. When only the foreskin is cut, it is called a dorsal incision. A dorsal circumcision leaves the entire foreskin but makes the end of the foreskin looser so it can be pulled back over the head of the penis. Tell a health care provider about:  Any allergies you have.  All medicines you are taking, including vitamins, herbs, eye drops, creams, and over-the-counter medicines.  Any problems you or family members have had with anesthetic medicines.  Any blood disorders you have.  Any surgeries you have had.  Any medical conditions you have, including a cold or other infection. What are the risks? Generally, this is a safe procedure. However, problems may occur, including:  Bleeding.  Infection.  Pain.  Urethral injury. The urethra is a tube that ends at the tip of the penis and carries urine out of your body.  Opening of the surgical wound. This can occur from an unwanted erection after surgery.  Allergic reactions  to medicines. What happens before the procedure? Staying hydrated Follow instructions from your health care provider about hydration, which may include:  Up to 2 hours before the procedure - you may continue to drink clear liquids, such as water, clear fruit juice, black coffee, and plain tea. Eating and drinking restrictions Follow instructions from your health care provider about eating and drinking, which may include:  8 hours before the procedure - stop eating heavy meals or foods such as  meat, fried foods, or fatty foods.  6 hours before the procedure - stop eating light meals or foods, such as toast or cereal.  6 hours before the procedure - stop drinking milk or drinks that contain milk.  2 hours before the procedure - stop drinking clear liquids. Medicines  Ask your health care provider about: ? Changing or stopping your regular medicines. This is especially important if you take diabetes medicines or blood thinners. ? Taking medicines such as aspirin and ibuprofen. These medicines can thin your blood. Do not take these medicines before your procedure if your doctor instructs you not to.  You may be given antibiotic medicine to help prevent infection. General instructions  If you will be going home right after the procedure, plan to have someone with you for 24 hours.  Plan to have someone take you home from the hospital or clinic.  Ask your health care provider how your surgical site will be marked or identified.  You may be asked to shower with a germ-killing soap. What happens during the procedure?  To reduce your risk of infection: ? Your health care team will wash or sanitize their hands. ? Your skin will be washed with soap.  An IV tube may be inserted into one of your veins.  You may be given medicine to help you relax (sedative).  An anesthetic will be injected with a needle into the skin of your penis (local anesthetic) to numb the nerves of your foreskin.  An incision will be made to remove or cut the foreskin.  Absorbable stitches (sutures) may be used to close the incision.  A bandage (dressing) will be applied to the incision site. The procedure may vary among health care providers and hospitals. What happens after the procedure?  Your blood pressure, heart rate, breathing rate, and blood oxygen level will be monitored until the medicines you were given have worn off.  Do not get out of bed until your health care provider approves.  Do  not drive for 24 hours after the procedure if you were given a sedative. Summary  Circumcision is a surgery to remove the foreskin of the penis or to cut the foreskin so the opening is larger.  Absorbable sutures may be used to close the incision after the foreskin has been removed or cut.  If you will be going home right after the procedure, plan to have someone with you for 24 hours. This information is not intended to replace advice given to you by your health care provider. Make sure you discuss any questions you have with your health care provider. Document Released: 09/04/2007 Document Revised: 07/28/2017 Document Reviewed: 07/04/2016 Elsevier Patient Education  2020 [employer].

## 2019-04-03 ENCOUNTER — Encounter (HOSPITAL_COMMUNITY): Payer: Self-pay

## 2019-04-03 ENCOUNTER — Encounter (HOSPITAL_COMMUNITY)
Admission: RE | Admit: 2019-04-03 | Discharge: 2019-04-03 | Disposition: A | Discharge status: Home / Self Care | Payer: 59 | Source: Ambulatory Visit | Attending: Urology | Admitting: Urology

## 2019-04-03 ENCOUNTER — Other Ambulatory Visit (HOSPITAL_COMMUNITY)
Admission: RE | Admit: 2019-04-03 | Discharge: 2019-04-03 | Disposition: A | Discharge status: Home / Self Care | Payer: 59 | Source: Ambulatory Visit | Attending: Urology | Admitting: Urology

## 2019-04-03 ENCOUNTER — Ambulatory Visit (INDEPENDENT_AMBULATORY_CARE_PROVIDER_SITE_OTHER): Payer: 59

## 2019-04-03 ENCOUNTER — Other Ambulatory Visit: Payer: Self-pay

## 2019-04-03 DIAGNOSIS — F329 Major depressive disorder, single episode, unspecified: Secondary | ICD-10-CM | POA: Diagnosis not present

## 2019-04-03 DIAGNOSIS — Z79891 Long term (current) use of opiate analgesic: Secondary | ICD-10-CM | POA: Diagnosis not present

## 2019-04-03 DIAGNOSIS — I2510 Atherosclerotic heart disease of native coronary artery without angina pectoris: Secondary | ICD-10-CM | POA: Diagnosis not present

## 2019-04-03 DIAGNOSIS — E119 Type 2 diabetes mellitus without complications: Secondary | ICD-10-CM | POA: Diagnosis not present

## 2019-04-03 DIAGNOSIS — I509 Heart failure, unspecified: Secondary | ICD-10-CM | POA: Diagnosis not present

## 2019-04-03 DIAGNOSIS — K219 Gastro-esophageal reflux disease without esophagitis: Secondary | ICD-10-CM | POA: Diagnosis not present

## 2019-04-03 DIAGNOSIS — Z7984 Long term (current) use of oral hypoglycemic drugs: Secondary | ICD-10-CM | POA: Diagnosis not present

## 2019-04-03 DIAGNOSIS — J449 Chronic obstructive pulmonary disease, unspecified: Secondary | ICD-10-CM | POA: Diagnosis not present

## 2019-04-03 DIAGNOSIS — G4730 Sleep apnea, unspecified: Secondary | ICD-10-CM | POA: Diagnosis not present

## 2019-04-03 DIAGNOSIS — Z87891 Personal history of nicotine dependence: Secondary | ICD-10-CM | POA: Diagnosis not present

## 2019-04-03 DIAGNOSIS — Z7901 Long term (current) use of anticoagulants: Secondary | ICD-10-CM | POA: Diagnosis not present

## 2019-04-03 DIAGNOSIS — I4821 Permanent atrial fibrillation: Secondary | ICD-10-CM

## 2019-04-03 DIAGNOSIS — F419 Anxiety disorder, unspecified: Secondary | ICD-10-CM | POA: Diagnosis not present

## 2019-04-03 DIAGNOSIS — N480 Leukoplakia of penis: Secondary | ICD-10-CM | POA: Diagnosis not present

## 2019-04-03 DIAGNOSIS — N471 Phimosis: Secondary | ICD-10-CM | POA: Diagnosis not present

## 2019-04-03 DIAGNOSIS — Z9049 Acquired absence of other specified parts of digestive tract: Secondary | ICD-10-CM | POA: Diagnosis not present

## 2019-04-03 DIAGNOSIS — Z79899 Other long term (current) drug therapy: Secondary | ICD-10-CM | POA: Diagnosis not present

## 2019-04-03 DIAGNOSIS — I4891 Unspecified atrial fibrillation: Secondary | ICD-10-CM | POA: Diagnosis not present

## 2019-04-03 DIAGNOSIS — I252 Old myocardial infarction: Secondary | ICD-10-CM | POA: Diagnosis not present

## 2019-04-03 DIAGNOSIS — Z20828 Contact with and (suspected) exposure to other viral communicable diseases: Secondary | ICD-10-CM | POA: Diagnosis not present

## 2019-04-03 DIAGNOSIS — Z955 Presence of coronary angioplasty implant and graft: Secondary | ICD-10-CM | POA: Diagnosis not present

## 2019-04-03 DIAGNOSIS — I110 Hypertensive heart disease with heart failure: Secondary | ICD-10-CM | POA: Diagnosis not present

## 2019-04-03 HISTORY — DX: Anemia, unspecified: D64.9

## 2019-04-03 HISTORY — DX: Chronic obstructive pulmonary disease, unspecified: J44.9

## 2019-04-03 HISTORY — DX: Malignant (primary) neoplasm, unspecified: C80.1

## 2019-04-03 LAB — BASIC METABOLIC PANEL
Anion gap: 9 (ref 5–15)
BUN: 21 mg/dL (ref 8–23)
CO2: 21 mmol/L — ABNORMAL LOW (ref 22–32)
Calcium: 8.9 mg/dL (ref 8.9–10.3)
Chloride: 106 mmol/L (ref 98–111)
Creatinine, Ser: 1.08 mg/dL (ref 0.61–1.24)
GFR calc Af Amer: >60 mL/min (ref >60)
GFR calc non Af Amer: >60 mL/min (ref >60)
Glucose, Bld: 140 mg/dL — ABNORMAL HIGH (ref 70–99)
Potassium: 4.2 mmol/L (ref 3.5–5.1)
Sodium: 136 mmol/L (ref 135–145)

## 2019-04-03 LAB — SARS CORONAVIRUS 2 (TAT 6-24 HRS): SARS Coronavirus 2: NEGATIVE

## 2019-04-03 LAB — HEMOGLOBIN A1C
Hgb A1c MFr Bld: 6.5 % — ABNORMAL HIGH (ref 4.8–5.6)
Mean Plasma Glucose: 139.85 mg/dL

## 2019-04-04 NOTE — Pre-Procedure Instructions (Signed)
HgbA1C routed to PCP. 

## 2019-04-05 ENCOUNTER — Ambulatory Visit (HOSPITAL_COMMUNITY): Payer: 59 | Admitting: Anesthesiology

## 2019-04-05 ENCOUNTER — Ambulatory Visit (HOSPITAL_COMMUNITY)
Admission: RE | Admit: 2019-04-05 | Discharge: 2019-04-05 | Disposition: A | Discharge status: Home / Self Care | Payer: 59 | Attending: Urology | Admitting: Urology

## 2019-04-05 ENCOUNTER — Encounter (HOSPITAL_COMMUNITY)
Admission: RE | Disposition: A | Discharge status: Home / Self Care | Payer: Self-pay | Source: Home / Self Care | Attending: Urology

## 2019-04-05 DIAGNOSIS — E119 Type 2 diabetes mellitus without complications: Secondary | ICD-10-CM | POA: Insufficient documentation

## 2019-04-05 DIAGNOSIS — J449 Chronic obstructive pulmonary disease, unspecified: Secondary | ICD-10-CM | POA: Insufficient documentation

## 2019-04-05 DIAGNOSIS — G4730 Sleep apnea, unspecified: Secondary | ICD-10-CM | POA: Insufficient documentation

## 2019-04-05 DIAGNOSIS — Z955 Presence of coronary angioplasty implant and graft: Secondary | ICD-10-CM | POA: Insufficient documentation

## 2019-04-05 DIAGNOSIS — I4891 Unspecified atrial fibrillation: Secondary | ICD-10-CM | POA: Insufficient documentation

## 2019-04-05 DIAGNOSIS — I2510 Atherosclerotic heart disease of native coronary artery without angina pectoris: Secondary | ICD-10-CM | POA: Insufficient documentation

## 2019-04-05 DIAGNOSIS — Z87891 Personal history of nicotine dependence: Secondary | ICD-10-CM | POA: Insufficient documentation

## 2019-04-05 DIAGNOSIS — N471 Phimosis: Secondary | ICD-10-CM | POA: Diagnosis not present

## 2019-04-05 DIAGNOSIS — F329 Major depressive disorder, single episode, unspecified: Secondary | ICD-10-CM | POA: Insufficient documentation

## 2019-04-05 DIAGNOSIS — I252 Old myocardial infarction: Secondary | ICD-10-CM | POA: Insufficient documentation

## 2019-04-05 DIAGNOSIS — Z79891 Long term (current) use of opiate analgesic: Secondary | ICD-10-CM | POA: Insufficient documentation

## 2019-04-05 DIAGNOSIS — I110 Hypertensive heart disease with heart failure: Secondary | ICD-10-CM | POA: Insufficient documentation

## 2019-04-05 DIAGNOSIS — F419 Anxiety disorder, unspecified: Secondary | ICD-10-CM | POA: Insufficient documentation

## 2019-04-05 DIAGNOSIS — Z79899 Other long term (current) drug therapy: Secondary | ICD-10-CM | POA: Insufficient documentation

## 2019-04-05 DIAGNOSIS — Z7901 Long term (current) use of anticoagulants: Secondary | ICD-10-CM | POA: Insufficient documentation

## 2019-04-05 DIAGNOSIS — K219 Gastro-esophageal reflux disease without esophagitis: Secondary | ICD-10-CM | POA: Insufficient documentation

## 2019-04-05 DIAGNOSIS — Z7984 Long term (current) use of oral hypoglycemic drugs: Secondary | ICD-10-CM | POA: Insufficient documentation

## 2019-04-05 DIAGNOSIS — Z9049 Acquired absence of other specified parts of digestive tract: Secondary | ICD-10-CM | POA: Insufficient documentation

## 2019-04-05 DIAGNOSIS — Z20828 Contact with and (suspected) exposure to other viral communicable diseases: Secondary | ICD-10-CM | POA: Insufficient documentation

## 2019-04-05 DIAGNOSIS — I509 Heart failure, unspecified: Secondary | ICD-10-CM | POA: Insufficient documentation

## 2019-04-05 DIAGNOSIS — N480 Leukoplakia of penis: Secondary | ICD-10-CM | POA: Insufficient documentation

## 2019-04-05 HISTORY — PX: CIRCUMCISION: SHX1350

## 2019-04-05 LAB — GLUCOSE, CAPILLARY
Glucose-Capillary: 132 mg/dL — ABNORMAL HIGH (ref 70–99)
Glucose-Capillary: 110 mg/dL — ABNORMAL HIGH (ref 70–99)

## 2019-04-05 SURGERY — CIRCUMCISION, ADULT
Anesthesia: General | Site: Penis

## 2019-04-05 MED ORDER — LIDOCAINE HCL (PF) 1 % IJ SOLN
INTRAMUSCULAR | Status: AC
  Filled 2019-04-05: qty 30

## 2019-04-05 MED ORDER — SODIUM CHLORIDE 0.9% FLUSH: 3.0000 mL | INTRAVENOUS | Status: DC | PRN

## 2019-04-05 MED ORDER — METOPROLOL TARTRATE 5 MG/5ML IV SOLN
INTRAVENOUS | Status: AC
  Filled 2019-04-05: qty 5

## 2019-04-05 MED ORDER — LACTATED RINGERS IV SOLN
INTRAVENOUS | Status: DC | PRN
  Administered 2019-04-05: 12:00:00 via INTRAVENOUS

## 2019-04-05 MED ORDER — MORPHINE SULFATE (PF) 2 MG/ML IV SOLN: 2.0000 mg | INTRAVENOUS | Status: DC | PRN

## 2019-04-05 MED ORDER — SODIUM CHLORIDE 0.9 % IV SOLN: 250.0000 mL | INTRAVENOUS | Status: DC | PRN

## 2019-04-05 MED ORDER — BUPIVACAINE HCL (PF) 0.25 % IJ SOLN
INTRAMUSCULAR | Status: AC
  Filled 2019-04-05: qty 30

## 2019-04-05 MED ORDER — LIDOCAINE HCL 1 % IJ SOLN
INTRAMUSCULAR | Status: DC | PRN
  Administered 2019-04-05: 60 mg via INTRADERMAL

## 2019-04-05 MED ORDER — OXYCODONE HCL 5 MG PO TABS: 5.0000 mg | ORAL_TABLET | ORAL | Status: DC | PRN

## 2019-04-05 MED ORDER — ACETAMINOPHEN 650 MG RE SUPP
650.0000 mg | RECTAL | Status: DC | PRN
  Filled 2019-04-05: qty 1

## 2019-04-05 MED ORDER — BUPIVACAINE HCL (PF) 0.25 % IJ SOLN
INTRAMUSCULAR | Status: DC | PRN
  Administered 2019-04-05: 7 mL

## 2019-04-05 MED ORDER — METOPROLOL TARTRATE 5 MG/5ML IV SOLN
INTRAVENOUS | Status: DC | PRN
  Administered 2019-04-05 (×2): 2.5 mg via INTRAVENOUS

## 2019-04-05 MED ORDER — MIDAZOLAM HCL 5 MG/5ML IJ SOLN
INTRAMUSCULAR | Status: DC | PRN
  Administered 2019-04-05: 2 mg via INTRAVENOUS

## 2019-04-05 MED ORDER — FENTANYL CITRATE (PF) 100 MCG/2ML IJ SOLN: 25.0000 ug | INTRAMUSCULAR | Status: DC | PRN

## 2019-04-05 MED ORDER — SODIUM CHLORIDE 0.9% FLUSH: 3.0000 mL | Freq: Two times a day (BID) | INTRAVENOUS | Status: DC

## 2019-04-05 MED ORDER — FENTANYL CITRATE (PF) 100 MCG/2ML IJ SOLN
INTRAMUSCULAR | Status: AC
  Filled 2019-04-05: qty 2

## 2019-04-05 MED ORDER — FENTANYL CITRATE (PF) 100 MCG/2ML IJ SOLN
INTRAMUSCULAR | Status: DC | PRN
  Administered 2019-04-05 (×3): 50 ug via INTRAVENOUS

## 2019-04-05 MED ORDER — PROPOFOL 10 MG/ML IV BOLUS
INTRAVENOUS | Status: DC | PRN
  Administered 2019-04-05: 150 mg via INTRAVENOUS

## 2019-04-05 MED ORDER — MIDAZOLAM HCL 2 MG/2ML IJ SOLN
INTRAMUSCULAR | Status: AC
  Filled 2019-04-05: qty 2

## 2019-04-05 MED ORDER — ONDANSETRON HCL 4 MG/2ML IJ SOLN: 4.0000 mg | Freq: Once | INTRAMUSCULAR | Status: DC | PRN

## 2019-04-05 MED ORDER — MEPERIDINE HCL 50 MG/ML IJ SOLN: 6.2500 mg | INTRAMUSCULAR | Status: DC | PRN

## 2019-04-05 MED ORDER — LIDOCAINE 2% (20 MG/ML) 5 ML SYRINGE
INTRAMUSCULAR | Status: AC
  Filled 2019-04-05: qty 5

## 2019-04-05 MED ORDER — METOCLOPRAMIDE HCL 5 MG/ML IJ SOLN
INTRAMUSCULAR | Status: DC | PRN
  Administered 2019-04-05: 10 mg via INTRAVENOUS

## 2019-04-05 MED ORDER — SODIUM CHLORIDE 0.9 % IR SOLN
Status: DC | PRN
  Administered 2019-04-05: 1000 mL

## 2019-04-05 MED ORDER — METOCLOPRAMIDE HCL 5 MG/ML IJ SOLN
INTRAMUSCULAR | Status: AC
  Filled 2019-04-05: qty 2

## 2019-04-05 MED ORDER — CEFAZOLIN SODIUM-DEXTROSE 2-4 GM/100ML-% IV SOLN
2.0000 g | INTRAVENOUS | Status: AC
  Administered 2019-04-05: 13:00:00 2 g via INTRAVENOUS
  Filled 2019-04-05: qty 100

## 2019-04-05 MED ORDER — ACETAMINOPHEN 325 MG PO TABS: 650.0000 mg | ORAL_TABLET | ORAL | Status: DC | PRN

## 2019-04-05 SURGICAL SUPPLY — 22 items
BANDAGE COBAN STERILE 2 (GAUZE/BANDAGES/DRESSINGS) ×2 IMPLANT
BNDG CONFORM 2 STRL LF (GAUZE/BANDAGES/DRESSINGS) ×2 IMPLANT
CLOTH BEACON ORANGE TIMEOUT ST (SAFETY) ×2 IMPLANT
COVER LIGHT HANDLE STERIS (MISCELLANEOUS) ×4 IMPLANT
DRSG XEROFORM 1X8 (GAUZE/BANDAGES/DRESSINGS) ×2 IMPLANT
GLOVE BIOGEL M 7.0 STRL (GLOVE) ×1 IMPLANT
GLOVE BIOGEL PI IND STRL 7.0 (GLOVE) ×1 IMPLANT
GLOVE BIOGEL PI INDICATOR 7.0 (GLOVE) ×1
GLOVE SURG SS PI 8.0 STRL IVOR (GLOVE) ×2 IMPLANT
GOWN STRL REUS W/TWL LRG LVL3 (GOWN DISPOSABLE) ×2 IMPLANT
GOWN STRL REUS W/TWL XL LVL3 (GOWN DISPOSABLE) ×2 IMPLANT
KIT TURNOVER CYSTO (KITS) ×2 IMPLANT
MANIFOLD NEPTUNE II (INSTRUMENTS) ×2 IMPLANT
NDL HYPO 25X1 1.5 SAFETY (NEEDLE) ×1 IMPLANT
NEEDLE HYPO 25X1 1.5 SAFETY (NEEDLE) ×2 IMPLANT
NS IRRIG 1000ML POUR BTL (IV SOLUTION) ×2 IMPLANT
PACK MINOR (CUSTOM PROCEDURE TRAY) ×2 IMPLANT
PAD ARMBOARD 7.5X6 YLW CONV (MISCELLANEOUS) ×2 IMPLANT
SET BASIN LINEN APH (SET/KITS/TRAYS/PACK) ×2 IMPLANT
SOL PREP PROV IODINE SCRUB 4OZ (MISCELLANEOUS) ×2 IMPLANT
SUT CHROMIC 4 0 PS 2 18 (SUTURE) ×4 IMPLANT
SYR CONTROL 10ML LL (SYRINGE) ×2 IMPLANT

## 2019-04-05 NOTE — Discharge Instructions (Signed)
Circumcision, Adult, Care After This sheet gives you information about how to care for yourself after your procedure. Your doctor may also give you more specific instructions. If you have problems or questions, contact your doctor. Follow these instructions at home: Cut care   Follow instructions from your doctor about how to take care of your cut from surgery (incision). Make sure you: ? Wash your hands with soap and water before you change your bandage (dressing). If you cannot use soap and water, use hand sanitizer. ? Change your bandage as told by your doctor. ? Leave stitches (sutures), skin glue, or skin tape (adhesive) strips in place. They may need to stay in place for 2 weeks or longer. If tape strips get loose and curl up, you may trim the loose edges. Do not remove tape strips completely unless your doctor says it is okay  Check your cut area every day for signs of infection. Check for: ? More redness, swelling, or pain. ? More fluid or blood. ? Warmth. ? Pus or a bad smell. Bathing  Do not take baths, swim, or use a hot tub until your doctor says it is okay. Ask your doctor if you can take showers. You may only be allowed to take sponge baths for bathing.  Do not get your cut area wet during the 24 hours after the procedure, or as long as told by your doctor.  When you can shower, do not rub the cut area. Gently pat it dry. General instructions  Avoid heavy lifting, contact sports, biking, or swimming until your doctor says it is okay.  Do not have sex until your doctor says it is okay.  Take or apply over-the-counter and prescription medicines only as told by your doctor.  Keep all follow-up visits as told by your doctor. This is important. Contact a doctor if:  Medicine does not help your pain.  You have more redness, swelling, or pain around your cut.  You have more fluid or blood coming from your cut.  Your cut feels warm to the touch.  You have pus or a bad  smell coming from your cut.  You have a fever. Get help right away if:  You cannot pee (urinate).  It hurts to pee.  Your pain is not helped by medicines.  There is redness, swelling, and soreness that spreads up the shaft of your penis, your thighs, or your lower belly (abdomen).  You have bleeding that does not stop when you press on it. Summary  Follow instructions from your doctor about how to take care of your cut from surgery (incision).  Check your cut area every day for signs of infection.  Do not have sex until your doctor says it is okay. This information is not intended to replace advice given to you by your health care provider. Make sure you discuss any questions you have with your health care provider. Document Released: 02/01/2008 Document Revised: 07/28/2017 Document Reviewed: 08/23/2016 Elsevier Patient Education  2020 [employer].

## 2019-04-05 NOTE — Anesthesia Postprocedure Evaluation (Signed)
Anesthesia Post Note  Patient: Shaun [name]  Procedure(s) Performed: CIRCUMCISION ADULT (N/A Penis)  Patient location during evaluation: PACU Anesthesia Type: General Level of consciousness: awake and alert and patient cooperative Pain management: satisfactory to patient Vital Signs Assessment: post-procedure vital signs reviewed and stable Respiratory status: spontaneous breathing Cardiovascular status: stable Postop Assessment: no apparent nausea or vomiting Anesthetic complications: no     Last Vitals:  Vitals:   04/05/19 1411 04/05/19 1415  BP: 127/88 123/79  Pulse: 91 60  Resp: 18 14  Temp:    SpO2: 93% 96%    Last Pain:  Vitals:   04/05/19 1415  PainSc: (P) Asleep                 Deboraha Goar

## 2019-04-05 NOTE — H&P (Signed)
CC/HPI: I have trouble rolling my foreskin back.     Shaun [name] is a 63 yo WM who is sent in consultation by Dr. Blake Divine for phimosis. He has had a problem with the phimosis for a couple of years. He can't retract the foreskin. He has no irritation or bleeding. He had a recent small bowel resection for a perforated viscus. He had stones in the 80's but no other GU history. He has been diabetic for about 8 years.     CC: AUA Questions Scoring.  HPI: Shaun [name] is a 63 year-old male patient who was referred by Dr. Curlene Labrum, MD who is here for evaluation.      AUA Symptom Score: Less than 50% of the time he has the sensation of not emptying his bladder completely when finished urinating. 50% of the time he has to urinate again fewer than two hours after he has finished urinating. Less than 50% of the time he has to start and stop again several times when he urinates. More than 50% of the time he finds it difficult to postpone urination. Less than 50% of the time he has a weak urinary stream. Less than 50% of the time he has to push or strain to begin urination. He has to get up to urinate 3 times from the time he goes to bed until the time he gets up in the morning.   Calculated AUA Symptom Score: 18    ALLERGIES: None   MEDICATIONS: Lisinopril 5 mg tablet  Metformin Hcl 500 mg tablet  Metoprolol Tartrate 50 mg tablet  Omeprazole 40 mg capsule,delayed release  Tamsulosin Hcl 0.4 mg capsule  Albuterol Sulfate  Atorvastatin Calcium 80 mg tablet  Bupropion Hcl Sr 150 mg tablet, extended release 12 hr  Docusate Sodium 100 mg capsule  Eliquis 5 mg tablet  Escitalopram Oxalate 20 mg tablet  Furosemide 20 mg tablet  Hydrocodone-Acetaminophen 5 mg-325 mg tablet  Meclizine Hcl 25 mg tablet  Nitroglycerin 0.4 mg tablet, sublingual  Ondansetron Hcl 4 mg tablet  Polysaccharide Iron 150 mg iron capsule  Potassium Chloride 20 meq tablet, extended release  Ropinirole Hcl 3 mg  tablet  Sildenafil Citrate 100 mg tablet  Sucralfate 1 gram tablet  Tetrahydrozoline Hcl 0.05 % drops  Trazodone Hcl 50 mg tablet  Triamcinolone Acetonide 0.1 % cream     GU PSH: None     PSH Notes: Intestinal surgery to repair lesion.  Cardiac stent   NON-GU PSH: None   GU PMH: History of urolithiasis    NON-GU PMH: Anxiety Arrhythmia Asthma Atrial Fibrillation Congestive heart failure Coronary Artery Disease Depression Diabetes Type 2 GERD Hypertension Myocardial Infarction Sleep Apnea    FAMILY HISTORY: 2 daughters - Runs in Family 3 Son's - Runs in Family Cancer - Mother Death of family member - Father, Mother Heart Disease - Father, Mother Kidney Stones - Runs in Family   SOCIAL HISTORY: Marital Status: Married Preferred Language: English; Ethnicity: Not Hispanic Or Latino; Race: White Current Smoking Status: Patient does not smoke anymore. Has not smoked since 02/27/2016. Smoked for 35 years. Smoked 1/2 pack per day.   Tobacco Use Assessment Completed: Used Tobacco in last 30 days? Has never drank.  Drinks 2 caffeinated drinks per day.    REVIEW OF SYSTEMS:    GU Review Male:   Patient reports frequent urination, hard to postpone urination, get up at night to urinate, leakage of urine, stream starts and stops, and erection problems. Patient denies  burning/ pain with urination, trouble starting your stream, have to strain to urinate , and penile pain.  Gastrointestinal (Upper):   Patient denies nausea, vomiting, and indigestion/ heartburn.  Gastrointestinal (Lower):   Patient denies diarrhea and constipation.  Constitutional:   Patient reports fatigue. Patient denies fever, night sweats, and weight loss.  Skin:   Patient reports itching. Patient denies skin rash/ lesion.  Eyes:   Patient denies blurred vision and double vision.  Ears/ Nose/ Throat:   Patient denies sore throat and sinus problems.  Hematologic/Lymphatic:   Patient reports easy bruising.  Patient denies swollen glands.  Cardiovascular:   Patient denies leg swelling and chest pains.  Respiratory:   Patient reports shortness of breath. Patient denies cough.  Endocrine:   Patient denies excessive thirst.  Musculoskeletal:   Patient reports back pain and joint pain.   Neurological:   Patient denies headaches and dizziness.  Psychologic:   Patient reports depression and anxiety.    VITAL SIGNS:      03/15/2019 08:56 AM  Weight 227 lb / 102.97 kg  Height 68 in / 172.72 cm  BP 130/73 mmHg  Heart Rate 83 /min  Temperature 98.9 F / 37.1 C  BMI 34.5 kg/m   GU PHYSICAL EXAMINATION:    Scrotum: No lesions. No edema. No cysts. No warts.  Epididymides: Right: no spermatocele, no masses, no cysts, no tenderness, no induration, no enlargement. Left: no spermatocele, no masses, no cysts, no tenderness, no induration, no enlargement.  Testes: No tenderness, no swelling, no enlargement left testes. No tenderness, no swelling, no enlargement right testes. Normal location left testes. Normal location right testes. No mass, no cyst, no varicocele, no hydrocele left testes. No mass, no cyst, no varicocele, no hydrocele right testes.  Penis: Penis uncircumcised, phimosis severe with a moderately buried penis. No foreskin warts, no cracks. No balanitis, Meatus not seen.    MULTI-SYSTEM PHYSICAL EXAMINATION:    Constitutional: Obese. No physical deformities. Normally developed. Good grooming.   Neck: Neck symmetrical, not swollen. Normal tracheal position.  Respiratory: Normal breath sounds. No labored breathing, no use of accessory muscles.   Cardiovascular: Regular rate and rhythm. No murmur, no gallop. no swelling, no varicosities.   Lymphatic: No enlargement, no tenderness of supraclavicular or neck lymph nodes.  Skin: No paleness, no jaundice, no cyanosis. No lesion, no ulcer, no rash.  Neurologic / Psychiatric: Oriented to time, oriented to place, oriented to person. No depression, no  anxiety, no agitation.  Gastrointestinal: Obese abdomen. No mass, no tenderness, no rigidity.   Musculoskeletal: Normal gait and station of head and neck.     PAST DATA REVIEWED:  Source Of History:  Patient  Lab Test Review:   PSA  Records Review:   AUA Symptom Score  Notes:                     Recent labs reviewed. PSA is 0.4.    PROCEDURES: None   ASSESSMENT:      ICD-10 Details  1 GU:   Phimosis - N47.1 He has severe phimosis without balanitis and will need a circumcision. He has obesity and the penis is somewhat buried as a result. I have reviewed the risks of the procedure including bleeding, infection, penile injury, scarring, insufficient residual skin, redundant residual skin, thrombotic events and anesthetic risks. I will get him cleared to come off of eliquis by Dr. Stanford Breed.    PLAN:  Schedule Return Visit/Planned Activity: Next Available Appointment - Schedule Surgery

## 2019-04-05 NOTE — Interval H&P Note (Signed)
History and Physical Interval Note:  04/05/2019 12:33 PM  Shaun [name]  has presented today for surgery, with the diagnosis of PHIMOSIS.  The various methods of treatment have been discussed with the patient and family. After consideration of risks, benefits and other options for treatment, the patient has consented to  Procedure(s): CIRCUMCISION ADULT (N/A) as a surgical intervention.  The patient's history has been reviewed, patient examined, no change in status, stable for surgery.  I have reviewed the patient's chart and labs.  Questions were answered to the patient's satisfaction.     Irine Seal

## 2019-04-05 NOTE — Op Note (Addendum)
Procedure: Circumcision.  Preop diagnosis: Phimosis.  Postop diagnosis: Same.  Surgeon: Dr. Irine Seal.  Anesthesia: General.  Specimen: Foreskin.  Drain: None.  EBL: Minimal.  Complications: None.  Indications: The patient is a 63 year old male with severe phimosis who is elected circumcision for management.  Procedure: He was given 2 g of Ancef.  He was placed on the table in the supine position and a general anesthetic was induced.  He was fitted with PAS hose.  His genitalia was prepped with Betadine solution and he was draped in usual sterile fashion.  A penile block was performed with quarter percent Marcaine.  Dorsal and ventral slits were performed and the redundant foreskin was then excised.  Hemostasis was achieved.  The skin edges were reapproximated with running 4-0 chromic tied at the quadrants. He had a moderately buried penis which will cause some redundancy of the residual foreskin but the skin was taylored to the stretched penile length to avoid excess excision.  A dressing of Xeroform, Kling and Coban was applied.  His anesthetic was reversed and he was moved to the recovery room in stable condition.  There were no complications.

## 2019-04-05 NOTE — Anesthesia Preprocedure Evaluation (Addendum)
Anesthesia Evaluation  Patient identified by MRN, date of birth, ID band Patient awake    Reviewed: Allergy & Precautions, NPO status , Patient's Chart, lab work & pertinent test results, reviewed documented beta blocker date and time   Airway Mallampati: III  TM Distance: >3 FB Neck ROM: Full    Dental  (+) Poor Dentition, Missing, Chipped,    Pulmonary asthma , sleep apnea , COPD,  COPD inhaler, Current Smoker and Patient abstained from smoking.,    breath sounds clear to auscultation       Cardiovascular Exercise Tolerance: Poor hypertension (Last dose of metoprolol - 04/04/19), Pt. on medications and Pt. on home beta blockers + angina + CAD, + Cardiac Stents (stent in 4 years ago) and +CHF  Normal cardiovascular exam+ dysrhythmias   05/2017 - Conclusions: 1.Mild to moderate non-obstructive coronary artery disease, as detailed below. LAD and LCx disease is not hemodynamically significant by FFR. 2.Patent stent in the distal LCx. 3.Normal left ventricular contraction and filling pressure. 4.Severe pain at the end of diagnostic catheterization without clear etiology. EKG's were unchanged from baseline, and pain resolved after the patient was able to sit up.  Recommendations: 1.Medical management and secondary prevention. 2.Consider evaluation for non-cardiac causes of chest pain.  Nelva Bush, MD [facility] Pager: [phone]   Neuro/Psych PSYCHIATRIC DISORDERS Depression    GI/Hepatic PUD, GERD (took omeprazole - 08/06.20)  Medicated and Controlled,  Endo/Other  diabetes, Well Controlled, Type 2  Renal/GU      Musculoskeletal  (+) Arthritis  (back pain), Osteoarthritis,    Abdominal   Peds  Hematology  (+) anemia ,   Anesthesia Other Findings GU PSH: None     PSH Notes: Intestinal surgery to repair lesion.  Cardiac stent   NON-GU PSH: None   GU PMH: History of urolithiasis    NON-GU PMH:  Anxiety Arrhythmia Asthma Atrial Fibrillation Congestive heart failure Coronary Artery Disease Depression Diabetes Type 2 GERD Hypertension Myocardial Infarction Sleep Apnea  Reproductive/Obstetrics                            Anesthesia Physical Anesthesia Plan  ASA: III  Anesthesia Plan: General   Post-op Pain Management:    Induction: Intravenous  PONV Risk Score and Plan: 2 and Ondansetron and Metaclopromide  Airway Management Planned: LMA  Additional Equipment:   Intra-op Plan:   Post-operative Plan:   Informed Consent: I have reviewed the patients History and Physical, chart, labs and discussed the procedure including the risks, benefits and alternatives for the proposed anesthesia with the patient or authorized representative who has indicated his/her understanding and acceptance.     Dental advisory given  Plan Discussed with: CRNA  Anesthesia Plan Comments:         Anesthesia Quick Evaluation

## 2019-04-05 NOTE — Anesthesia Procedure Notes (Signed)
Procedure Name: LMA Insertion Date/Time: 04/05/2019 1:04 PM Performed by: Charmaine Downs, CRNA Pre-anesthesia Checklist: Patient identified, Patient being monitored, Emergency Drugs available, Timeout performed and Suction available Patient Re-evaluated:Patient Re-evaluated prior to induction Oxygen Delivery Method: Circle System Utilized Preoxygenation: Pre-oxygenation with 100% oxygen Induction Type: IV induction Ventilation: Mask ventilation without difficulty LMA: LMA inserted LMA Size: 4.0 Number of attempts: 1 Placement Confirmation: positive ETCO2 and breath sounds checked- equal and bilateral Tube secured with: Tape Dental Injury: Teeth and Oropharynx as per pre-operative assessment

## 2019-04-05 NOTE — Transfer of Care (Signed)
Immediate Anesthesia Transfer of Care Note  Patient: Shaun [name]  Procedure(s) Performed: CIRCUMCISION ADULT (N/A Penis)  Patient Location: PACU  Anesthesia Type:General  Level of Consciousness: drowsy and patient cooperative  Airway & Oxygen Therapy: Patient Spontanous Breathing and Patient connected to face mask oxygen  Post-op Assessment: Report given to RN, Post -op Vital signs reviewed and stable and Patient moving all extremities  Post vital signs: Reviewed and stable  Last Vitals:  Vitals Value Taken Time  BP    Temp    Pulse    Resp    SpO2      Last Pain:  Vitals:   04/05/19 1127  PainSc: 0-No pain         Complications: No apparent anesthesia complications

## 2019-04-08 ENCOUNTER — Ambulatory Visit (HOSPITAL_COMMUNITY): Payer: 59 | Attending: Cardiology

## 2019-04-08 ENCOUNTER — Other Ambulatory Visit: Payer: Self-pay

## 2019-04-08 ENCOUNTER — Encounter (HOSPITAL_COMMUNITY): Payer: Self-pay | Admitting: Urology

## 2019-04-08 DIAGNOSIS — R0600 Dyspnea, unspecified: Secondary | ICD-10-CM | POA: Diagnosis not present

## 2019-04-10 ENCOUNTER — Other Ambulatory Visit: Payer: Self-pay | Admitting: Family Medicine

## 2019-04-10 ENCOUNTER — Encounter: Payer: Self-pay | Admitting: Family Medicine

## 2019-04-10 ENCOUNTER — Other Ambulatory Visit: Payer: Self-pay

## 2019-04-10 ENCOUNTER — Ambulatory Visit (INDEPENDENT_AMBULATORY_CARE_PROVIDER_SITE_OTHER): Payer: 59 | Admitting: Family Medicine

## 2019-04-10 VITALS — BP 120/74 | Temp 98.1°F | Ht 68.0 in | Wt 224.6 lb

## 2019-04-10 DIAGNOSIS — E119 Type 2 diabetes mellitus without complications: Secondary | ICD-10-CM

## 2019-04-10 DIAGNOSIS — R062 Wheezing: Secondary | ICD-10-CM | POA: Diagnosis not present

## 2019-04-10 DIAGNOSIS — I10 Essential (primary) hypertension: Secondary | ICD-10-CM

## 2019-04-10 DIAGNOSIS — R0600 Dyspnea, unspecified: Secondary | ICD-10-CM | POA: Diagnosis not present

## 2019-04-10 DIAGNOSIS — F339 Major depressive disorder, recurrent, unspecified: Secondary | ICD-10-CM

## 2019-04-10 DIAGNOSIS — Z79891 Long term (current) use of opiate analgesic: Secondary | ICD-10-CM

## 2019-04-10 MED ORDER — HYDROCODONE-ACETAMINOPHEN 5-325 MG PO TABS: ORAL_TABLET | ORAL | 0 refills | Status: DC

## 2019-04-10 MED ORDER — ROPINIROLE HCL 3 MG PO TABS: ORAL_TABLET | ORAL | 1 refills | Status: DC

## 2019-04-10 MED ORDER — OMEPRAZOLE 40 MG PO CPDR: 40.0000 mg | DELAYED_RELEASE_CAPSULE | Freq: Every day | ORAL | 1 refills | Status: DC

## 2019-04-10 MED ORDER — ATORVASTATIN CALCIUM 80 MG PO TABS: ORAL_TABLET | ORAL | 1 refills | Status: DC

## 2019-04-10 MED ORDER — ESCITALOPRAM OXALATE 20 MG PO TABS: 20.0000 mg | ORAL_TABLET | Freq: Every day | ORAL | 1 refills | Status: DC

## 2019-04-10 MED ORDER — METFORMIN HCL 500 MG PO TABS: ORAL_TABLET | ORAL | 1 refills | Status: DC

## 2019-04-10 MED ORDER — BUPROPION HCL ER (XL) 150 MG PO TB24: ORAL_TABLET | ORAL | 1 refills | Status: DC

## 2019-04-10 NOTE — Progress Notes (Signed)
Subjective:    Patient ID: Shaun [name], male    DOB: 1956/07/06, 63 y.o.   MRN: [id number]  HPI This patient was seen today for chronic pain.   Takes hydrocodone 5/325mg  for right hip pain  The medication list was reviewed and updated.   -Compliance with medication: takes 3 a day  - Number patient states they take daily: 3   -when was the last dose patient took? yesterday  The patient was advised the importance of maintaining medication and not using illegal substances with these.  Here for refills and follow up  The patient was educated that we can provide 3 monthly scripts for their medication, it is their responsibility to follow the instructions.  Side effects or complications from medications: none  Patient is aware that pain medications are meant to minimize the severity of the pain to allow their pain levels to improve to allow for better function. They are aware of that pain medications cannot totally remove their pain.  Due for UDT ( at least once per year) : last one 10/09/18  A1C done in hospital on 04/03/19. Result was 6.5.  Results for orders placed or performed during the hospital encounter of 04/05/19  Glucose, capillary  Result Value Ref Range   Glucose-Capillary 132 (H) 70 - 99 mg/dL  Glucose, capillary  Result Value Ref Range   Glucose-Capillary 110 (H) 70 - 99 mg/dL   Patient claims compliance with diabetes medication. No obvious side effects. Reports no substantial low sugar spells. Most numbers are generally in good range when checked fasting. Generally does not miss a dose of medication. Watching diabetic diet closely  Patient notes ongoing compliance with antidepressant medication. No obvious side effects. Reports does not miss a dose. Overall continues to help depression substantially. No thoughts of homicide or suicide. Would like to maintain medication.  Blood pressure medicine and blood pressure levels reviewed today with patient. Compliant  with blood pressure medicine. States does not miss a dose. No obvious side effects. Blood pressure generally good when checked elsewhere. Watching salt intake.   Patient notes ongoing challenges with dyspnea.  Complicated by his chronic atrial fibrillation and reactive airways and general deconditioning   Patient compliant with pain medication. Continues to experience the pain which led to initiation of analgesic intervention. No significant negative side effects. States definitely needs the pain medication to maintain current level of functioning. Does not receive controlled substance pain medication elsewhere.    Review of Systems No headache, no major weight loss or weight gain, no chest pain no back pain abdominal pain no change in bowel habits complete ROS otherwise negative     Objective:   Physical Exam  Alert and oriented, vitals reviewed and stable, NAD ENT-TM's and ext canals WNL bilat via otoscopic exam Soft palate, tonsils and post pharynx WNL via oropharyngeal exam Neck-symmetric, no masses; thyroid nonpalpable and nontender Pulmonary-no tachypnea or accessory muscle use; Clear without wheezes via auscultation Card--no abnrml murmurs, rhythm reg and rate WNL Carotid pulses symmetric, without bruits       Assessment & Plan:  Impression 1 type 2 diabetes.  Control good discussed to maintain same meds  2.  Hypertension.  Good control discussed to maintain same meds  3.  Depression.  Patient states a bit more irritability of late.  Long discussion held in this regard.  Options discussed in this regard.  Patient elects to increase his Wellbutrin to 450 mg daily.  Hold off on mental health referral  at this time.  4.  Exertional dyspnea.  Likely multifactorial.  Reactive airways certainly component.  But also shows atrial fibrillation and deconditioning and perhaps even his suboptimum social situation with her grandchildren  5.  Chronic pain  Impression: Chronic pain.  Patient compliant with medication. No substantial side effects. [place] controlled substance registry reviewed to ensure compliance and proper use of medication. Patient aware goal of medicine is not complete resolution of pain but to control his symptoms to improve his functional capacity. Aware of potential adverse side effects   Remarkably full 40 minutes spent with this patient discussing his numerous numerous issues.  I have advised him I need some help in order to try to help maintain his health appropriately.  I will be referring him to a pulmonologist to take over his breathing issues and further investigate and make recommendations

## 2019-04-12 ENCOUNTER — Other Ambulatory Visit: Payer: Self-pay | Admitting: Cardiology

## 2019-04-12 ENCOUNTER — Ambulatory Visit: Payer: 59 | Admitting: Family Medicine

## 2019-04-17 ENCOUNTER — Other Ambulatory Visit: Payer: Self-pay

## 2019-04-17 ENCOUNTER — Encounter: Payer: Self-pay | Admitting: Gastroenterology

## 2019-04-17 ENCOUNTER — Ambulatory Visit: Payer: 59 | Admitting: Gastroenterology

## 2019-04-17 VITALS — BP 102/68 | HR 72 | Temp 97.0°F | Ht 68.0 in | Wt 226.2 lb

## 2019-04-17 DIAGNOSIS — D509 Iron deficiency anemia, unspecified: Secondary | ICD-10-CM

## 2019-04-17 DIAGNOSIS — K219 Gastro-esophageal reflux disease without esophagitis: Secondary | ICD-10-CM

## 2019-04-17 NOTE — Progress Notes (Signed)
Primary Care Physician: Mikey Kirschner, MD  Primary Gastroenterologist:  Barney Drain, MD   Chief Complaint  Patient presents with  . ida  . Gastroesophageal Reflux    occas flare    HPI: Shaun [name] is a 63 y.o. male here for follow-up. Last seen in 09/2018. EGD/TCS 09/2018, he had low-grade narrowing Schatzki ring which was dilated.  Gastritis.  32 polyps removed from the colon, 10 were tubular adenomas.  Next colonoscopy 3 years.  History of small bowel perforation requiring exploratory laparotomy with small bowel resection in January 2020.  History of chronic microcytic anemia labs consistent with IDA in November 2019.  Anemia dating back at least to 2018.  Hemoglobin improved in April, 10.9, MCV low at 75.  Complains of fatigue.  Gives out after walking 20 to 30 yards.  Some dyspnea on exertion as well.  Denies any abdominal pain.  Appetite is good.  Intentional 14 pound weight loss recently.  Bowel movements regular.  No blood in the stool or melena.  Heartburn is reasonably well controlled.  Rare dysphagia.  No vomiting. No longer on iron.     Current Outpatient Medications  Medication Sig Dispense Refill  . atorvastatin (LIPITOR) 80 MG tablet TAKE 1 TABLET(80 MG) BY MOUTH EVERY EVENING 90 tablet 1  . buPROPion (WELLBUTRIN XL) 150 MG 24 hr tablet Take 3 tablets once daily 270 tablet 1  . docusate sodium (COLACE) 100 MG capsule Take 1 capsule (100 mg total) by mouth 2 (two) times daily. (Patient taking differently: Take 100 mg by mouth daily as needed for mild constipation. ) 60 capsule 2  . ELIQUIS 5 MG TABS tablet TAKE 1 TABLET(5 MG) BY MOUTH TWICE DAILY (Patient taking differently: Take 5 mg by mouth 2 (two) times daily. ) 60 tablet 6  . escitalopram (LEXAPRO) 20 MG tablet Take 1 tablet (20 mg total) by mouth daily. TAKE 1 TABLET(20 MG) BY MOUTH DAILY 90 tablet 1  . furosemide (LASIX) 20 MG tablet TAKE 2 TABLETS(40 MG) BY MOUTH DAILY (Patient taking differently:  Take 40 mg by mouth daily. ) 90 tablet 1  . HYDROcodone-acetaminophen (NORCO/VICODIN) 5-325 MG tablet Take one tid prn pain 90 tablet 0  . HYDROcodone-acetaminophen (NORCO/VICODIN) 5-325 MG tablet Take one tid prn pain 90 tablet 0  . HYDROcodone-acetaminophen (NORCO/VICODIN) 5-325 MG tablet Take one tid prn pain 90 tablet 0  . lisinopril (ZESTRIL) 2.5 MG tablet Take 1 tablet (2.5 mg total) by mouth daily. 90 tablet 3  . meclizine (ANTIVERT) 25 MG tablet Take 1 tablet (25 mg total) by mouth 3 (three) times daily as needed for dizziness. 30 tablet 0  . metFORMIN (GLUCOPHAGE) 500 MG tablet TAKE 1 TABLET(500 MG) BY MOUTH TWICE DAILY WITH A MEAL 180 tablet 1  . metoprolol tartrate (LOPRESSOR) 100 MG tablet Take 1 tablet (100 mg total) by mouth 2 (two) times daily. 180 tablet 3  . nitroGLYCERIN (NITROSTAT) 0.4 MG SL tablet Place 1 tablet (0.4 mg total) under the tongue every 5 (five) minutes as needed. (Patient taking differently: Place 0.4 mg under the tongue every 5 (five) minutes as needed for chest pain. ) 25 tablet 3  . omeprazole (PRILOSEC) 40 MG capsule Take 1 capsule (40 mg total) by mouth daily. 90 capsule 1  . ondansetron (ZOFRAN-ODT) 4 MG disintegrating tablet Take 1 tablet (4 mg total) by mouth every 6 (six) hours as needed for nausea. 20 tablet 0  . potassium chloride SA (K-DUR) 20  MEQ tablet Take 1 tablet (20 mEq total) by mouth daily. (Patient taking differently: Take 20 mEq by mouth 2 (two) times daily. ) 90 tablet 2  . rOPINIRole (REQUIP) 3 MG tablet TAKE 1 TABLET(3 MG) BY MOUTH AT BEDTIME 270 tablet 1  . sildenafil (VIAGRA) 100 MG tablet Take 1 tablet 30 minutes before activity 10 tablet 3  . sodium chloride (OCEAN) 0.65 % SOLN nasal spray Place 1 spray into both nostrils daily as needed for congestion.     . sucralfate (CARAFATE) 1 g tablet TAKE 1 TABLET BY MOUTH THREE TIMES DAILY BEFORE MEALS 42 tablet 1  . tetrahydrozoline (VISINE) 0.05 % ophthalmic solution Place 2 drops into both  eyes daily as needed (for dry eyes).    . traZODone (DESYREL) 50 MG tablet TAKE 2 TABLETS(100 MG) BY MOUTH AT BEDTIME (Patient taking differently: Take 100 mg by mouth at bedtime. ) 60 tablet 2  . triamcinolone cream (KENALOG) 0.1 % Apply 1 application topically 2 (two) times daily. (Patient taking differently: Apply 1 application topically daily as needed (for irritation). ) 60 g 1  . VENTOLIN HFA 108 (90 Base) MCG/ACT inhaler INHALE 2 PUFFS INTO THE LUNGS EVERY 6 HOURS AS NEEDED FOR WHEEZING OR SHORTNESS OF BREATH (Patient taking differently: Inhale 2 puffs into the lungs every 6 (six) hours as needed for wheezing or shortness of breath. ) 18 g 0   No current facility-administered medications for this visit.     Allergies as of 04/17/2019  . (No Known Allergies)    ROS:  General: Negative for anorexia, weight loss, fever, chills, fatigue, weakness. ENT: Negative for hoarseness, difficulty swallowing , nasal congestion. CV: Negative for chest pain, angina, palpitations, dyspnea on exertion, peripheral edema.  Respiratory: Negative for dyspnea at rest, dyspnea on exertion, cough, sputum, wheezing.  GI: See history of present illness. GU:  Negative for dysuria, hematuria, urinary incontinence, urinary frequency, nocturnal urination.  Endo: Negative for unusual weight change.    Physical Examination:   BP 102/68   Pulse 72   Temp (!) 97 F (36.1 C) (Oral)   Ht 5\' 8"  (1.727 m)   Wt 226 lb 3.2 oz (102.6 kg)   BMI 34.39 kg/m   General: Well-nourished, well-developed in no acute distress.  Eyes: No icterus. Mouth: Oropharyngeal mucosa moist and pink , no lesions erythema or exudate. Abdomen: Bowel sounds are normal, nontender, nondistended, no hepatosplenomegaly or masses, no abdominal bruits or hernia , no rebound or guarding.   Extremities: No lower extremity edema. No clubbing or deformities. Neuro: Alert and oriented x 4   Skin: Warm and dry, no jaundice.   Psych: Alert and  cooperative, normal mood and affect.  Labs:  Lab Results  Component Value Date   CREATININE 1.08 04/03/2019   BUN 21 04/03/2019   NA 136 04/03/2019   K 4.2 04/03/2019   CL 106 04/03/2019   CO2 21 (L) 04/03/2019  ' Lab Results  Component Value Date   ALT 20 03/28/2019   AST 18 03/28/2019   ALKPHOS 111 03/28/2019   BILITOT 0.4 03/28/2019   Lab Results  Component Value Date   WBC 7.2 12/14/2018   HGB 10.9 (L) 12/14/2018   HCT 35.0 (L) 12/14/2018   MCV 75 (L) 12/14/2018   PLT 186 12/14/2018   Lab Results  Component Value Date   IRON 27 (L) 07/10/2018   TIBC 524 (H) 07/10/2018   FERRITIN 4 (L) 07/10/2018     Imaging Studies: No results  found.

## 2019-04-17 NOTE — Assessment & Plan Note (Signed)
>>  ASSESSMENT AND PLAN FOR GERD (GASTROESOPHAGEAL REFLUX DISEASE) WRITTEN ON 04/17/2019  1:58 PM BY LEWIS, LESLIE S, PA-C  Relatively well controlled.  Continue omeprazole 20 mg daily.  Reinforced antireflux measures.

## 2019-04-17 NOTE — Assessment & Plan Note (Signed)
Relatively well controlled.  Continue omeprazole 20 mg daily.  Reinforced antireflux measures.

## 2019-04-17 NOTE — Patient Instructions (Signed)
1. Go for labs anytime in the next two weeks to follow up on your anemia. 2. Continue omeprazole daily.

## 2019-04-17 NOTE — Progress Notes (Signed)
CC'ED TO PCP 

## 2019-04-17 NOTE — Assessment & Plan Note (Signed)
Iron deficiency anemia, no longer on oral iron supplement.  On chronic Eliquis.  Repeat labs in the near future.  Persistent IDA, would recommend small bowel capsule endoscopy to complete GI work-up.

## 2019-04-20 ENCOUNTER — Other Ambulatory Visit: Payer: Self-pay | Admitting: Cardiology

## 2019-04-24 ENCOUNTER — Telehealth: Payer: Self-pay | Admitting: Family Medicine

## 2019-04-24 DIAGNOSIS — Z029 Encounter for administrative examinations, unspecified: Secondary | ICD-10-CM

## 2019-04-24 NOTE — Telephone Encounter (Signed)
Patient brought in disability form to be completed by physican .Last seen 04/18/19  Virtually. Form is in your box.

## 2019-04-26 ENCOUNTER — Ambulatory Visit (INDEPENDENT_AMBULATORY_CARE_PROVIDER_SITE_OTHER): Payer: 59 | Admitting: Urology

## 2019-04-26 DIAGNOSIS — N471 Phimosis: Secondary | ICD-10-CM | POA: Diagnosis not present

## 2019-05-02 ENCOUNTER — Ambulatory Visit (INDEPENDENT_AMBULATORY_CARE_PROVIDER_SITE_OTHER): Payer: 59 | Admitting: Family Medicine

## 2019-05-02 ENCOUNTER — Other Ambulatory Visit: Payer: Self-pay

## 2019-05-02 DIAGNOSIS — R0600 Dyspnea, unspecified: Secondary | ICD-10-CM

## 2019-05-02 DIAGNOSIS — E119 Type 2 diabetes mellitus without complications: Secondary | ICD-10-CM | POA: Diagnosis not present

## 2019-05-02 DIAGNOSIS — I10 Essential (primary) hypertension: Secondary | ICD-10-CM

## 2019-05-02 NOTE — Progress Notes (Signed)
   Subjective:  Audio only  Patient ID: SHAPHAN KENDRICK, male    DOB: [birth year], 63 y.o.   MRN: [id number]  HPIpt needs forms for work completed. Pt states no concerns today.   Virtual Visit via Telephone Note  I connected with Kyros Kremin Lafoy on 05/02/19 at 11:00 AM EDT by telephone and verified that I am speaking with the correct person using two identifiers.  Location: Patient: home Provider: office   I discussed the limitations, risks, security and privacy concerns of performing an evaluation and management service by telephone and the availability of in person appointments. I also discussed with the patient that there may be a patient responsible charge related to this service. The patient expressed understanding and agreed to proceed.   History of Present Illness:    Observations/Objective:   Assessment and Plan:   Follow Up Instructions:    I discussed the assessment and treatment plan with the patient. The patient was provided an opportunity to ask questions and all were answered. The patient agreed with the plan and demonstrated an understanding of the instructions.   The patient was advised to call back or seek an in-person evaluation if the symptoms worsen or if the condition fails to improve as anticipated.  I provided 28 minutes of non-face-to-face time during this encounter.   Patient arrives to the office for discussion regarding his form.  Patient has multiple risk factors for coronavirus.  See assessment below.  His company is now insisting that he does not FMLA assessment in this regard.     Review of Systems No headache, no major weight loss or weight gain, no chest pain no back pain abdominal pain no change in bowel habits complete ROS otherwise negative     Objective:   Physical Exam Virtual       Assessment & Plan:  Impression patient has tremendous number of risk factors for serious COVID-19.  Does include a history of congestive  heart failure.  Known atrial fibrillation.  Known coronary artery disease.  Known asthma.  Known diabetes.  And now progressive dyspnea etiology unclear.  Pulmonary referral pending.  Very long discussion held.  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  He would be a very high risk for hospitalization or death.  Discussed.  Form filled out in patient's presence.  30 minutes total spent with patient today.  Greater than 50% of this greater than 25 minute face to face visit was spent in counseling and discussion and coordination of care regarding the above diagnosis/diagnosies

## 2019-05-03 NOTE — Telephone Encounter (Signed)
Patient forms are up front for pickup and they have been faxed

## 2019-05-05 ENCOUNTER — Other Ambulatory Visit: Payer: Self-pay | Admitting: Family Medicine

## 2019-05-07 ENCOUNTER — Encounter: Payer: Self-pay | Admitting: Pulmonary Disease

## 2019-05-07 ENCOUNTER — Ambulatory Visit: Payer: 59 | Admitting: Pulmonary Disease

## 2019-05-07 ENCOUNTER — Other Ambulatory Visit: Payer: Self-pay

## 2019-05-07 VITALS — BP 140/82 | HR 96 | Temp 97.8°F | Ht 68.0 in | Wt 228.0 lb

## 2019-05-07 DIAGNOSIS — R062 Wheezing: Secondary | ICD-10-CM | POA: Diagnosis not present

## 2019-05-07 DIAGNOSIS — Z720 Tobacco use: Secondary | ICD-10-CM

## 2019-05-07 DIAGNOSIS — R0600 Dyspnea, unspecified: Secondary | ICD-10-CM

## 2019-05-07 DIAGNOSIS — Z7901 Long term (current) use of anticoagulants: Secondary | ICD-10-CM

## 2019-05-07 DIAGNOSIS — R0609 Other forms of dyspnea: Secondary | ICD-10-CM

## 2019-05-07 MED ORDER — ANORO ELLIPTA 62.5-25 MCG/INH IN AEPB: 1.0000 | INHALATION_SPRAY | Freq: Every day | RESPIRATORY_TRACT | 0 refills | Status: DC

## 2019-05-07 NOTE — Progress Notes (Signed)
Patient seen in the office today and instructed on use of Anoro.  Patient expressed understanding and demonstrated technique. 

## 2019-05-07 NOTE — Progress Notes (Signed)
Synopsis: Referred in September 2020 for dyspnea wheezing by Mikey Kirschner, MD  Subjective:   PATIENT ID: Shaun [name] GENDER: male DOB: 09/16/1955, MRN: [id number]  Chief Complaint  Patient presents with  . Consult    Consult for SOB. He reports he developed DOE about a year ago. He reports sometimes at rest. Denies chest pain. Reports heaviness, tightnes,, and pressure. Also increased wheezing with productive cough with clear mucous. Currently using ventolin 1-2x/day. Smokes about 1/2 pack/day per pt.      63 year old gentleman past medical history CAD, CHF, asthma/COPD no prior pulmonary function tests August 2028, echocardiogram preserved ejection fraction, severe basal septal hypertrophy normal right ventricular function.  History of small bowel perforation requiring exploratory laparotomy and resection in January 2020.  OV 05/07/2019: SOB, wheezing for the past year, wheezing in early morning and sometimes at night. He can walk about 30 yards before he feels like he has to rest. He works as a Banker at [employer], [employer]. Not working currently due to covid risk at work and not wanting be exposed. No allergies. Never hospitalized for respiratory illness. Brother and mother asthma. Mother with lung cancer, she was a non-smoker. Father DMII and heart disease. Associated with coughing and sputum production. Current smoker, 41 years of smoking, 1-2 years stopped, >30 pack year history, highest 1 ppd.    Past Medical History:  Diagnosis Date  . Anemia   . Arteriosclerotic cardiovascular disease (ASCVD)    Nonobstructive; cath in 2000: 30-40% mid LAD and proximal RCA;normal EF. stress nuclear in 2006-subtle inferoseptal hypoperfusion with reversibility; negative stress EKG; good exercise tolerance  . Asthma   . Cancer ([place])    skin cancer  . CHF (congestive heart failure) ([place])   . Colon polyps    30 colon polyps found on first colonoscopy  . COPD (chronic  obstructive pulmonary disease) ([place])   . Diabetes mellitus without complication ([place])   . Diverticulitis   . DJD (degenerative joint disease)   . GERD (gastroesophageal reflux disease)   . History of kidney stones   . Hyperlipidemia   . Hypertension   . Insomnia   . Obstructive sleep apnea 12/2009   01/26/2010 AHI 83/hr  . Paroxysmal atrial fibrillation ([place]) 10/2004   Onset in 10/2004; recurred 09/2008  . PUD (peptic ulcer disease)    1980s  . RLS (restless legs syndrome)   . Sinusitis      Family History  Problem Relation Age of Onset  . Hypertension Mother   . Breast cancer Mother 27       brain/bone   . Heart attack Father   . Diabetes Brother   . Skin cancer Sister 53  . Brain cancer Maternal Uncle   . Cancer Maternal Uncle        NOS  . Breast cancer Cousin        maternal cousin dx <50  . Cancer Cousin   . Colon cancer Neg Hx      Past Surgical History:  Procedure Laterality Date  . BIOPSY  07/17/2018   Procedure: BIOPSY;  Surgeon: Danie Binder, MD;  [facility];  Service: Endoscopy;;  colon  . BOWEL RESECTION  09/17/2018   SMALL BOWEL RESECTION: 71 CM   . CARDIAC CATHETERIZATION N/A 07/21/2015   Procedure: Left Heart Cath and Coronary Angiography;  Surgeon: Peter M Martinique, MD;  Location: [place] [facility];  Service: Cardiovascular;  Laterality: N/A;  . CARDIAC CATHETERIZATION N/A  07/21/2015   Procedure: Coronary Stent Intervention;  Surgeon: Peter M Martinique, MD;  Location: [facility];  Service: Cardiovascular;  Laterality: N/A;  . CIRCUMCISION N/A 04/05/2019   Procedure: CIRCUMCISION ADULT;  Surgeon: Irine Seal, MD;  Location: [facility];  Service: Urology;  Laterality: N/A;  . COLONOSCOPY N/A 05/19/2014   Dr. Barnie Alderman diverticulosis/moderate external hemorrhoids, >20 simple adenomas. Genetic screening negative.   . COLONOSCOPY WITH PROPOFOL N/A 07/17/2018   Dr. Oneida Alar: Diverticulosis, external/internal hemorrhoids, 32 colon polyps removed.   ten tubular adenomas removed with no high-grade dysplasia.  Advised to have surveillance colonoscopy in 3 years.  . ESOPHAGOGASTRODUODENOSCOPY (EGD) WITH PROPOFOL N/A 07/17/2018   Dr. Oneida Alar: Low-grade narrowing Schatzki ring at the GE junction status post dilation.  Gastritis.  Biopsy with mild nonspecific reactive gastropathy.  No H. pylori.  Marland Kitchen HERNIA REPAIR  1986   Left inguinal  . INTRAVASCULAR PRESSURE WIRE/FFR STUDY Left 06/08/2017   Procedure: INTRAVASCULAR PRESSURE WIRE/FFR STUDY;  Surgeon: Nelva Bush, MD;  Location: [facility];  Service: Cardiovascular;  Laterality: Left;  LAD and CFX  . LAPAROTOMY N/A 09/17/2018   Procedure: EXPLORATORY LAPAROTOMY;  Surgeon: Virl Cagey, MD;  Location: [facility];  Service: General;  Laterality: N/A;  . LEFT HEART CATH AND CORONARY ANGIOGRAPHY N/A 06/08/2017   Procedure: LEFT HEART CATH AND CORONARY ANGIOGRAPHY;  Surgeon: Nelva Bush, MD;  Location: [facility];  Service: Cardiovascular;  Laterality: N/A;  . POLYPECTOMY  07/17/2018   Procedure: POLYPECTOMY;  Surgeon: Danie Binder, MD;  [facility];  Service: Endoscopy;;  colon  . ROTATOR CUFF REPAIR     Right  . SAVORY DILATION N/A 07/17/2018   Procedure: SAVORY DILATION;  Surgeon: Danie Binder, MD;  [facility];  Service: Endoscopy;  Laterality: N/A;    Social History   Socioeconomic History  . Marital status: Married    Spouse name: Not on file  . Number of children: 5  . Years of education: Not on file  . Highest education level: Not on file  Occupational History  . Occupation: employed    Fish farm manager: [employer]    Comment: full-time  Social Needs  . Financial resource strain: Not on file  . Food insecurity    Worry: Not on file    Inability: Not on file  . Transportation needs    Medical: Not on file    Non-medical: Not on file  Tobacco Use  . Smoking status: Current Every Day Smoker    Packs/day: 0.50    Years: 32.00     Pack years: 16.00    Types: Cigarettes    Start date: 07/24/1970  . Smokeless tobacco: Never Used  Substance and Sexual Activity  . Alcohol use: No    Alcohol/week: 0.0 standard drinks  . Drug use: No  . Sexual activity: Yes    Partners: Female  Lifestyle  . Physical activity    Days per week: Not on file    Minutes per session: Not on file  . Stress: Not on file  Relationships  . Social Herbalist on phone: Not on file    Gets together: Not on file    Attends religious service: Not on file    Active member of club or organization: Not on file    Attends meetings of clubs or organizations: Not on file    Relationship status: Not on file  . Intimate partner violence  Fear of current or ex partner: Not on file    Emotionally abused: Not on file    Physically abused: Not on file    Forced sexual activity: Not on file  Other Topics Concern  . Not on file  Social History Narrative  . Not on file     No Known Allergies   Outpatient Medications Prior to Visit  Medication Sig Dispense Refill  . atorvastatin (LIPITOR) 80 MG tablet TAKE 1 TABLET(80 MG) BY MOUTH EVERY EVENING 90 tablet 1  . buPROPion (WELLBUTRIN XL) 150 MG 24 hr tablet Take 3 tablets once daily 270 tablet 1  . docusate sodium (COLACE) 100 MG capsule Take 1 capsule (100 mg total) by mouth 2 (two) times daily. (Patient taking differently: Take 100 mg by mouth daily as needed for mild constipation. ) 60 capsule 2  . ELIQUIS 5 MG TABS tablet TAKE 1 TABLET(5 MG) BY MOUTH TWICE DAILY 60 tablet 6  . escitalopram (LEXAPRO) 20 MG tablet Take 1 tablet (20 mg total) by mouth daily. TAKE 1 TABLET(20 MG) BY MOUTH DAILY 90 tablet 1  . furosemide (LASIX) 20 MG tablet TAKE 2 TABLETS(40 MG) BY MOUTH DAILY (Patient taking differently: Take 40 mg by mouth daily. ) 90 tablet 1  . HYDROcodone-acetaminophen (NORCO/VICODIN) 5-325 MG tablet Take one tid prn pain 90 tablet 0  . lisinopril (ZESTRIL) 2.5 MG tablet Take 1 tablet  (2.5 mg total) by mouth daily. 90 tablet 3  . meclizine (ANTIVERT) 25 MG tablet Take 1 tablet (25 mg total) by mouth 3 (three) times daily as needed for dizziness. 30 tablet 0  . metFORMIN (GLUCOPHAGE) 500 MG tablet TAKE 1 TABLET(500 MG) BY MOUTH TWICE DAILY WITH A MEAL 180 tablet 1  . metoprolol tartrate (LOPRESSOR) 100 MG tablet Take 1 tablet (100 mg total) by mouth 2 (two) times daily. 180 tablet 3  . nitroGLYCERIN (NITROSTAT) 0.4 MG SL tablet Place 1 tablet (0.4 mg total) under the tongue every 5 (five) minutes as needed. (Patient taking differently: Place 0.4 mg under the tongue every 5 (five) minutes as needed for chest pain. ) 25 tablet 3  . omeprazole (PRILOSEC) 40 MG capsule Take 1 capsule (40 mg total) by mouth daily. 90 capsule 1  . ondansetron (ZOFRAN-ODT) 4 MG disintegrating tablet Take 1 tablet (4 mg total) by mouth every 6 (six) hours as needed for nausea. 20 tablet 0  . potassium chloride SA (K-DUR) 20 MEQ tablet Take 1 tablet (20 mEq total) by mouth daily. (Patient taking differently: Take 20 mEq by mouth 2 (two) times daily. ) 90 tablet 2  . rOPINIRole (REQUIP) 3 MG tablet TAKE 1 TABLET(3 MG) BY MOUTH AT BEDTIME 270 tablet 1  . sildenafil (VIAGRA) 100 MG tablet Take 1 tablet 30 minutes before activity 10 tablet 3  . sodium chloride (OCEAN) 0.65 % SOLN nasal spray Place 1 spray into both nostrils daily as needed for congestion.     . sucralfate (CARAFATE) 1 g tablet TAKE 1 TABLET BY MOUTH THREE TIMES DAILY BEFORE MEALS 42 tablet 1  . tetrahydrozoline (VISINE) 0.05 % ophthalmic solution Place 2 drops into both eyes daily as needed (for dry eyes).    . traZODone (DESYREL) 50 MG tablet TAKE 2 TABLETS(100 MG) BY MOUTH AT BEDTIME (Patient taking differently: Take 100 mg by mouth at bedtime. ) 60 tablet 2  . triamcinolone cream (KENALOG) 0.1 % Apply 1 application topically 2 (two) times daily. (Patient taking differently: Apply 1 application topically daily as  needed (for irritation). ) 60 g  1  . VENTOLIN HFA 108 (90 Base) MCG/ACT inhaler INHALE 2 PUFFS INTO THE LUNGS EVERY 6 HOURS AS NEEDED FOR WHEEZING OR SHORTNESS OF BREATH (Patient taking differently: Inhale 2 puffs into the lungs every 6 (six) hours as needed for wheezing or shortness of breath. ) 18 g 0  . HYDROcodone-acetaminophen (NORCO/VICODIN) 5-325 MG tablet Take one tid prn pain (Patient not taking: Reported on 05/07/2019) 90 tablet 0  . HYDROcodone-acetaminophen (NORCO/VICODIN) 5-325 MG tablet Take one tid prn pain (Patient not taking: Reported on 05/07/2019) 90 tablet 0   No facility-administered medications prior to visit.     Review of Systems  Constitutional: Negative for chills, fever, malaise/fatigue and weight loss.  HENT: Negative for hearing loss, sore throat and tinnitus.   Eyes: Negative for blurred vision and double vision.  Respiratory: Positive for cough, sputum production, shortness of breath and wheezing. Negative for hemoptysis and stridor.   Cardiovascular: Negative for chest pain, palpitations, orthopnea, leg swelling and PND.  Gastrointestinal: Negative for abdominal pain, constipation, diarrhea, heartburn, nausea and vomiting.  Genitourinary: Negative for dysuria, hematuria and urgency.  Musculoskeletal: Negative for joint pain and myalgias.  Skin: Negative for itching and rash.  Neurological: Negative for dizziness, tingling, weakness and headaches.  Endo/Heme/Allergies: Negative for environmental allergies. Does not bruise/bleed easily.  Psychiatric/Behavioral: Negative for depression. The patient is not nervous/anxious and does not have insomnia.   All other systems reviewed and are negative.    Objective:  Physical Exam Vitals signs reviewed.  Constitutional:      General: He is not in acute distress.    Appearance: He is well-developed. He is obese.  HENT:     Head: Normocephalic and atraumatic.  Eyes:     General: No scleral icterus.    Conjunctiva/sclera: Conjunctivae normal.      Pupils: Pupils are equal, round, and reactive to light.  Neck:     Musculoskeletal: Neck supple.     Vascular: No JVD.     Trachea: No tracheal deviation.  Cardiovascular:     Rate and Rhythm: Normal rate and regular rhythm.     Heart sounds: Normal heart sounds. No murmur.  Pulmonary:     Effort: Pulmonary effort is normal. No tachypnea, accessory muscle usage or respiratory distress.     Breath sounds: Normal breath sounds. No stridor. No wheezing, rhonchi or rales.  Abdominal:     General: Bowel sounds are normal. There is no distension.     Palpations: Abdomen is soft.     Tenderness: There is no abdominal tenderness.  Musculoskeletal:        General: No tenderness.  Lymphadenopathy:     Cervical: No cervical adenopathy.  Skin:    General: Skin is warm and dry.     Capillary Refill: Capillary refill takes less than 2 seconds.     Findings: No rash.  Neurological:     Mental Status: He is alert and oriented to person, place, and time.  Psychiatric:        Behavior: Behavior normal.      Vitals:   05/07/19 0908  BP: 140/82  Pulse: 96  Temp: 97.8 F (36.6 C)  TempSrc: Temporal  SpO2: 98%  Weight: 228 lb (103.4 kg)  Height: 5\' 8"  (1.727 m)   98% on RA BMI Readings from Last 3 Encounters:  05/07/19 34.67 kg/m  04/17/19 34.39 kg/m  04/10/19 34.15 kg/m   Wt Readings from Last 3 Encounters:  05/07/19 228 lb (103.4 kg)  04/17/19 226 lb 3.2 oz (102.6 kg)  04/10/19 224 lb 9.6 oz (101.9 kg)     CBC    Component Value Date/Time   WBC 7.2 12/14/2018 0832   WBC 8.0 09/20/2018 0443   RBC 4.66 12/14/2018 0832   RBC 3.95 (L) 09/20/2018 0443   HGB 10.9 (L) 12/14/2018 0832   HCT 35.0 (L) 12/14/2018 0832   PLT 186 12/14/2018 0832   MCV 75 (L) 12/14/2018 0832   MCH 23.4 (L) 12/14/2018 0832   MCH 22.8 (L) 09/20/2018 0443   MCHC 31.1 (L) 12/14/2018 0832   MCHC 29.8 (L) 09/20/2018 0443   RDW 17.2 (H) 12/14/2018 0832   LYMPHSABS 1.3 09/20/2018 0443   LYMPHSABS 2.3  07/06/2018 1127   MONOABS 0.7 09/20/2018 0443   EOSABS 0.1 09/20/2018 0443   EOSABS 0.1 07/06/2018 1127   BASOSABS 0.0 09/20/2018 0443   BASOSABS 0.1 07/06/2018 1127      Chest Imaging: Chest x-ray 09/21/2018: Vascular congestion, taken during hospitalization. The patient's images have been independently reviewed by me.    Pulmonary Functions Testing Results: No flowsheet data found.  FeNO: None   Pathology: None   Echocardiogram:  August 2020: Preserved ejection fraction, severe basal hypertrophy, normal right ventricular function.  Heart Catheterization:   October 2018: Dr. Saunders Revel  Dist Cx lesion, 30 %stenosed. Conclusions: 1. Mild to moderate non-obstructive coronary artery disease, as detailed below. LAD and LCx disease is not hemodynamically significant by FFR. 2. Patent stent in the distal LCx. 3. Normal left ventricular contraction and filling pressure. 4. Severe pain at the end of diagnostic catheterization without clear etiology. EKG's were unchanged from baseline, and pain resolved after the patient was able to sit up. Recommendations: 1. Medical management and secondary prevention. 2. Consider evaluation for non-cardiac causes of chest pain.    Assessment & Plan:     ICD-10-CM   1. DOE (dyspnea on exertion)  R06.09 Pulmonary Function Test  2. Wheezing  R06.2 Pulmonary Function Test  3. On continuous oral anticoagulation  Z79.01   4. Tobacco abuse  Z72.0 Ambulatory Referral for Lung Cancer Scre    Pulmonary Function Test    Discussion:  63 year old gentleman ongoing dyspnea on exertion, wheezing on occasion.  Significant tobacco abuse history.  Mother with lung cancer history.  Likely has underlying smoking-related lung disease such as COPD.  No significant atopic or allergy history.  Plan: We will obtain full PFTs prior to next office visit. He will need prescreening COVID testing. Due to family history of lung cancer and tobacco abuse history will  refer to our lung cancer screening program. Start Anoro Ellipta once daily.  Samples given.  Inhaler training complete. Continue albuterol as needed for shortness of breath and wheezing We will supply refills as needed. Patient to follow-up in our clinic in 4 weeks Can be seen by app or myself on next office visit.  Greater than 50% of this patient's 45-minute of visit was been face-to-face discussing the recommendations and treatment plan.   Current Outpatient Medications:  .  atorvastatin (LIPITOR) 80 MG tablet, TAKE 1 TABLET(80 MG) BY MOUTH EVERY EVENING, Disp: 90 tablet, Rfl: 1 .  buPROPion (WELLBUTRIN XL) 150 MG 24 hr tablet, Take 3 tablets once daily, Disp: 270 tablet, Rfl: 1 .  docusate sodium (COLACE) 100 MG capsule, Take 1 capsule (100 mg total) by mouth 2 (two) times daily. (Patient taking differently: Take 100 mg by mouth daily as needed for mild  constipation. ), Disp: 60 capsule, Rfl: 2 .  ELIQUIS 5 MG TABS tablet, TAKE 1 TABLET(5 MG) BY MOUTH TWICE DAILY, Disp: 60 tablet, Rfl: 6 .  escitalopram (LEXAPRO) 20 MG tablet, Take 1 tablet (20 mg total) by mouth daily. TAKE 1 TABLET(20 MG) BY MOUTH DAILY, Disp: 90 tablet, Rfl: 1 .  furosemide (LASIX) 20 MG tablet, TAKE 2 TABLETS(40 MG) BY MOUTH DAILY (Patient taking differently: Take 40 mg by mouth daily. ), Disp: 90 tablet, Rfl: 1 .  HYDROcodone-acetaminophen (NORCO/VICODIN) 5-325 MG tablet, Take one tid prn pain, Disp: 90 tablet, Rfl: 0 .  lisinopril (ZESTRIL) 2.5 MG tablet, Take 1 tablet (2.5 mg total) by mouth daily., Disp: 90 tablet, Rfl: 3 .  meclizine (ANTIVERT) 25 MG tablet, Take 1 tablet (25 mg total) by mouth 3 (three) times daily as needed for dizziness., Disp: 30 tablet, Rfl: 0 .  metFORMIN (GLUCOPHAGE) 500 MG tablet, TAKE 1 TABLET(500 MG) BY MOUTH TWICE DAILY WITH A MEAL, Disp: 180 tablet, Rfl: 1 .  metoprolol tartrate (LOPRESSOR) 100 MG tablet, Take 1 tablet (100 mg total) by mouth 2 (two) times daily., Disp: 180 tablet, Rfl:  3 .  nitroGLYCERIN (NITROSTAT) 0.4 MG SL tablet, Place 1 tablet (0.4 mg total) under the tongue every 5 (five) minutes as needed. (Patient taking differently: Place 0.4 mg under the tongue every 5 (five) minutes as needed for chest pain. ), Disp: 25 tablet, Rfl: 3 .  omeprazole (PRILOSEC) 40 MG capsule, Take 1 capsule (40 mg total) by mouth daily., Disp: 90 capsule, Rfl: 1 .  ondansetron (ZOFRAN-ODT) 4 MG disintegrating tablet, Take 1 tablet (4 mg total) by mouth every 6 (six) hours as needed for nausea., Disp: 20 tablet, Rfl: 0 .  potassium chloride SA (K-DUR) 20 MEQ tablet, Take 1 tablet (20 mEq total) by mouth daily. (Patient taking differently: Take 20 mEq by mouth 2 (two) times daily. ), Disp: 90 tablet, Rfl: 2 .  rOPINIRole (REQUIP) 3 MG tablet, TAKE 1 TABLET(3 MG) BY MOUTH AT BEDTIME, Disp: 270 tablet, Rfl: 1 .  sildenafil (VIAGRA) 100 MG tablet, Take 1 tablet 30 minutes before activity, Disp: 10 tablet, Rfl: 3 .  sodium chloride (OCEAN) 0.65 % SOLN nasal spray, Place 1 spray into both nostrils daily as needed for congestion. , Disp: , Rfl:  .  sucralfate (CARAFATE) 1 g tablet, TAKE 1 TABLET BY MOUTH THREE TIMES DAILY BEFORE MEALS, Disp: 42 tablet, Rfl: 1 .  tetrahydrozoline (VISINE) 0.05 % ophthalmic solution, Place 2 drops into both eyes daily as needed (for dry eyes)., Disp: , Rfl:  .  traZODone (DESYREL) 50 MG tablet, TAKE 2 TABLETS(100 MG) BY MOUTH AT BEDTIME (Patient taking differently: Take 100 mg by mouth at bedtime. ), Disp: 60 tablet, Rfl: 2 .  triamcinolone cream (KENALOG) 0.1 %, Apply 1 application topically 2 (two) times daily. (Patient taking differently: Apply 1 application topically daily as needed (for irritation). ), Disp: 60 g, Rfl: 1 .  VENTOLIN HFA 108 (90 Base) MCG/ACT inhaler, INHALE 2 PUFFS INTO THE LUNGS EVERY 6 HOURS AS NEEDED FOR WHEEZING OR SHORTNESS OF BREATH (Patient taking differently: Inhale 2 puffs into the lungs every 6 (six) hours as needed for wheezing or  shortness of breath. ), Disp: 18 g, Rfl: 0   Garner Nash, DO [facility] 05/07/2019 9:14 AM

## 2019-05-07 NOTE — Patient Instructions (Addendum)
Thank you for visiting Dr. Valeta Harms at [facility]. Today we recommend the following:  Orders Placed This Encounter  Procedures  . Ambulatory Referral for Lung Cancer Scre  . Pulmonary Function Test   Start Anoro Ellipta.  [place] will show you how to use this today in the office.  Please continue use of albuterol for shortness of breath and wheezing as needed.  Return in about 4 weeks (around 06/04/2019) for with APP or Dr. Valeta Harms.    Please do your part to reduce the spread of COVID-19.

## 2019-05-10 ENCOUNTER — Other Ambulatory Visit: Payer: Self-pay | Admitting: *Deleted

## 2019-05-10 DIAGNOSIS — Z87891 Personal history of nicotine dependence: Secondary | ICD-10-CM

## 2019-05-10 DIAGNOSIS — Z122 Encounter for screening for malignant neoplasm of respiratory organs: Secondary | ICD-10-CM

## 2019-05-10 DIAGNOSIS — F17210 Nicotine dependence, cigarettes, uncomplicated: Secondary | ICD-10-CM

## 2019-05-21 LAB — CBC WITH DIFFERENTIAL/PLATELET
Absolute Monocytes: 453 cells/uL (ref 200–950)
Basophils Absolute: 29 cells/uL (ref 0–200)
Basophils Relative: 0.4 %
Eosinophils Absolute: 73 cells/uL (ref 15–500)
Eosinophils Relative: 1 %
HCT: 37.2 % — ABNORMAL LOW (ref 38.5–50.0)
Hemoglobin: 12 g/dL — ABNORMAL LOW (ref 13.2–17.1)
Lymphs Abs: 1497 cells/uL (ref 850–3900)
MCH: 25.1 pg — ABNORMAL LOW (ref 27.0–33.0)
MCHC: 32.3 g/dL (ref 32.0–36.0)
MCV: 77.7 fL — ABNORMAL LOW (ref 80.0–100.0)
MPV: 10.9 fL (ref 7.5–12.5)
Monocytes Relative: 6.2 %
Neutro Abs: 5249 cells/uL (ref 1500–7800)
Neutrophils Relative %: 71.9 %
Platelets: 171 10*3/uL (ref 140–400)
RBC: 4.79 10*6/uL (ref 4.20–5.80)
RDW: 16.2 % — ABNORMAL HIGH (ref 11.0–15.0)
Total Lymphocyte: 20.5 %
WBC: 7.3 10*3/uL (ref 3.8–10.8)

## 2019-05-21 LAB — IRON,TIBC AND FERRITIN PANEL
%SAT: 9 % (calc) — ABNORMAL LOW (ref 20–48)
Ferritin: 7 ng/mL — ABNORMAL LOW (ref 24–380)
Iron: 44 ug/dL — ABNORMAL LOW (ref 50–180)
TIBC: 464 mcg/dL (calc) — ABNORMAL HIGH (ref 250–425)

## 2019-05-22 ENCOUNTER — Other Ambulatory Visit: Payer: Self-pay

## 2019-05-22 ENCOUNTER — Ambulatory Visit
Admission: RE | Admit: 2019-05-22 | Discharge: 2019-05-22 | Disposition: A | Discharge status: Home / Self Care | Payer: 59 | Source: Ambulatory Visit | Attending: Acute Care | Admitting: Acute Care

## 2019-05-22 ENCOUNTER — Encounter: Payer: Self-pay | Admitting: Acute Care

## 2019-05-22 ENCOUNTER — Ambulatory Visit: Payer: 59 | Admitting: Acute Care

## 2019-05-22 VITALS — BP 128/68 | HR 95 | Temp 97.8°F | Ht 68.0 in | Wt 227.8 lb

## 2019-05-22 DIAGNOSIS — Z122 Encounter for screening for malignant neoplasm of respiratory organs: Secondary | ICD-10-CM

## 2019-05-22 DIAGNOSIS — Z87891 Personal history of nicotine dependence: Secondary | ICD-10-CM

## 2019-05-22 DIAGNOSIS — F17210 Nicotine dependence, cigarettes, uncomplicated: Secondary | ICD-10-CM

## 2019-05-22 NOTE — Patient Instructions (Signed)
Thank you for participating in the [facility]. It was our pleasure to meet you today. We will call you with the results of your scan within the next few days. Your scan will be assigned a Lung RADS category score by the physicians reading the scans.  This Lung RADS score determines follow up scanning.  See below for description of categories, and follow up screening recommendations. We will be in touch to schedule your follow up screening annually or based on recommendations of our providers. We will fax a copy of your scan results to your Primary Care Physician, or the physician who referred you to the program, to ensure they have the results. Please call the office if you have any questions or concerns regarding your scanning experience or results.  Our office number is [phone]. Please speak with Denise Phelps, RN. She is our Lung Cancer Screening RN. If she is unavailable when you call, please have the office staff send her a message. She will return your call at her earliest convenience. Remember, if your scan is normal, we will scan you annually as long as you continue to meet the criteria for the program. (Age 55-77, Current smoker or smoker who has quit within the last 15 years). If you are a smoker, remember, quitting is the single most powerful action that you can take to decrease your risk of lung cancer and other pulmonary, breathing related problems. We know quitting is hard, and we are here to help.  Please let us know if there is anything we can do to help you meet your goal of quitting. If you are a former smoker, congratulations. We are proud of you! Remain smoke free! Remember you can refer friends or family members through the number above.  We will screen them to make sure they meet criteria for the program. Thank you for helping us take better care of you by participating in Lung Screening.  Lung RADS Categories:  Lung RADS 1: no nodules  or definitely non-concerning nodules.  Recommendation is for a repeat annual scan in 12 months.  Lung RADS 2:  nodules that are non-concerning in appearance and behavior with a very low likelihood of becoming an active cancer. Recommendation is for a repeat annual scan in 12 months.  Lung RADS 3: nodules that are probably non-concerning , includes nodules with a low likelihood of becoming an active cancer.  Recommendation is for a 6-month repeat screening scan. Often noted after an upper respiratory illness. We will be in touch to make sure you have no questions, and to schedule your 6-month scan.  Lung RADS 4 A: nodules with concerning findings, recommendation is most often for a follow up scan in 3 months or additional testing based on our provider's assessment of the scan. We will be in touch to make sure you have no questions and to schedule the recommended 3 month follow up scan.  Lung RADS 4 B:  indicates findings that are concerning. We will be in touch with you to schedule additional diagnostic testing based on our provider's  assessment of the scan.   

## 2019-05-22 NOTE — Progress Notes (Signed)
Shared Decision Making Visit Lung Cancer Screening Program [phone])   Eligibility:  Age 63 y.o.  Pack Years Smoking History Calculation 39 pack year smoking history (# packs/per year x # years smoked)  Recent History of coughing up blood  no  Unexplained weight loss? no ( >Than 15 pounds within the last 6 months )  Prior History Lung / other cancer no (Diagnosis within the last 5 years already requiring surveillance chest CT Scans).  Smoking Status Current Smoker  Former Smokers: Years since quit: NA  Quit Date: NA  Visit Components:  Discussion included one or more decision making aids. yes  Discussion included risk/benefits of screening. yes  Discussion included potential follow up diagnostic testing for abnormal scans. yes  Discussion included meaning and risk of over diagnosis. yes  Discussion included meaning and risk of False Positives. yes  Discussion included meaning of total radiation exposure. yes  Counseling Included:  Importance of adherence to annual lung cancer LDCT screening. yes  Impact of comorbidities on ability to participate in the program. yes  Ability and willingness to under diagnostic treatment. yes  Smoking Cessation Counseling:  Current Smokers:   Discussed importance of smoking cessation. yes  Information about tobacco cessation classes and interventions provided to patient. yes  Patient provided with "ticket" for LDCT Scan. yes  Symptomatic Patient. no  CounselingNA  Diagnosis Code: Tobacco Use Z72.0  Asymptomatic Patient yes  Counseling (Intermediate counseling: > three minutes counseling) [id number]  Former Smokers:   Discussed the importance of maintaining cigarette abstinence. yes  Diagnosis Code: Personal History of Nicotine Dependence. [id number]  Information about tobacco cessation classes and interventions provided to patient. Yes  Patient provided with "ticket" for LDCT Scan. yes  Written Order for Lung Cancer  Screening with LDCT placed in Epic. Yes (CT Chest Lung Cancer Screening Low Dose W/O CM) [id number] Z12.2-Screening of respiratory organs Z87.891-Personal history of nicotine dependence  BP 128/68 (BP Location: Left Arm, Cuff Size: Large)   Pulse 95   Temp 97.8 F (36.6 C) (Oral)   Ht 5\' 8"  (1.727 m)   Wt 227 lb 12.8 oz (103.3 kg)   SpO2 97%   BMI 34.64 kg/m    I have spent 25 minutes of face to face time with Shaun [name] discussing the risks and benefits of lung cancer screening. We viewed a power point together that explained in detail the above noted topics. We paused at intervals to allow for questions to be asked and answered to ensure understanding.We discussed that the single most powerful action that he can take to decrease his risk of developing lung cancer is to quit smoking. We discussed whether or not he is ready to commit to setting a quit date. We discussed options for tools to aid in quitting smoking including nicotine replacement therapy, non-nicotine medications, support groups, Quit Smart classes, and behavior modification. We discussed that often times setting smaller, more achievable goals, such as eliminating 1 cigarette a day for a week and then 2 cigarettes a day for a week can be helpful in slowly decreasing the number of cigarettes smoked. This allows for a sense of accomplishment as well as providing a clinical benefit. I gave him the " Be Stronger Than Your Excuses" card with contact information for community resources, classes, free nicotine replacement therapy, and access to mobile apps, text messaging, and on-line smoking cessation help. I have also given him my card and contact information in the event he needs to contact me. We discussed the  time and location of the scan, and that either Doroteo Glassman RN or I will call with the results within 24-48 hours of receiving them. I have offered him  a copy of the power point we viewed  as a resource in the event they need  reinforcement of the concepts we discussed today in the office. The patient verbalized understanding of [name] of  the above and had no further questions upon leaving the office. They have my contact information in the event they have any further questions.  I spent 3 minutes counseling on smoking cessation and the health risks of continued tobacco abuse.  I explained to the patient that there has been a high incidence of coronary artery disease noted on these exams. I explained that this is a non-gated exam therefore degree or severity cannot be determined. This patient is currently on statin therapy. I have asked the patient to follow-up with their PCP regarding any incidental finding of coronary artery disease and management with diet or medication as their PCP  feels is clinically indicated. The patient verbalized understanding of the above and had no further questions upon completion of the visit.      Magdalen Spatz, NP 05/22/2019 9:40 AM

## 2019-05-23 ENCOUNTER — Telehealth: Payer: Self-pay | Admitting: Pulmonary Disease

## 2019-05-23 MED ORDER — ANORO ELLIPTA 62.5-25 MCG/INH IN AEPB: 1.0000 | INHALATION_SPRAY | Freq: Every day | RESPIRATORY_TRACT | 3 refills | Status: DC

## 2019-05-23 NOTE — Telephone Encounter (Signed)
Call made to patient, confirmed DOB, medication, and pharmacy. Refill sent. Nothing further needed at this time.

## 2019-05-24 ENCOUNTER — Encounter: Payer: Self-pay | Admitting: Acute Care

## 2019-05-27 ENCOUNTER — Ambulatory Visit: Payer: 59 | Admitting: Cardiology

## 2019-06-03 ENCOUNTER — Telehealth: Payer: Self-pay | Admitting: *Deleted

## 2019-06-03 ENCOUNTER — Other Ambulatory Visit: Payer: Self-pay

## 2019-06-03 ENCOUNTER — Telehealth: Payer: Self-pay | Admitting: Gastroenterology

## 2019-06-03 ENCOUNTER — Other Ambulatory Visit: Payer: Self-pay | Admitting: Family Medicine

## 2019-06-03 ENCOUNTER — Other Ambulatory Visit: Payer: Self-pay | Admitting: *Deleted

## 2019-06-03 DIAGNOSIS — D509 Iron deficiency anemia, unspecified: Secondary | ICD-10-CM

## 2019-06-03 NOTE — Telephone Encounter (Signed)
Pt called for his results ( see lab results). He was informed of results and plan.  Ok to schedule the [employer]. He is aware to go back on iron ( Ferrous Sulfate 325 mg bid). We will repeat CBC and Ferritin in 8 weeks and mail those orders to him and he is aware. He is also aware not to take the iron within one week of capsule study.  Forwarding to [facility] to schedule G[facility].

## 2019-06-03 NOTE — Telephone Encounter (Signed)
Pt called asking for DS. Please call him back at [phone]

## 2019-06-03 NOTE — Telephone Encounter (Signed)
REVIEWED-NO ADDITIONAL RECOMMENDATIONS. 

## 2019-06-03 NOTE — Telephone Encounter (Signed)
Called patient. Offered several dates for GIVENS. Patient decided to schedule for 10/26 at 7:30am. Patient aware will need to stop iron 1 week prior. I have mailed instructions to him (confirmed mailing address). FYI to Dr. Oneida Alar.

## 2019-06-03 NOTE — Addendum Note (Signed)
Addended by: Cheron Every on: 06/03/2019 11:01 AM   Modules accepted: Orders

## 2019-06-03 NOTE — Telephone Encounter (Signed)
Notes recorded by Mahala Menghini, PA-C on 05/27/2019 at 5:16 PM EDT  Some improvement in anemia but persistent IDA. Iron stores remain quite low.  Would recommend Givens capsule to complete gi work up of Shaun [name].  He should go back on iron (but none within one week of capsule study). FERROUS SULFATE 325MG  BID.  Repeat cbc, ferritin in 8 weeks.

## 2019-06-03 NOTE — Telephone Encounter (Signed)
Per [facility] website for [facility]: Notification or Prior Authorization is not required for the requested services  This [employer] plan does not currently require a prior authorization for these services. If you have general questions about the prior authorization requirements, please call us at [phone] or visit [email or web address] > Clinician Resources > Advance and Admission Notification Requirements. The number above acknowledges your notification. Please write this number down for future reference. Notification is not a guarantee of coverage or payment.  Decision ID [id number]

## 2019-06-04 ENCOUNTER — Telehealth: Payer: Self-pay | Admitting: Pulmonary Disease

## 2019-06-04 DIAGNOSIS — F17210 Nicotine dependence, cigarettes, uncomplicated: Secondary | ICD-10-CM

## 2019-06-04 DIAGNOSIS — Z87891 Personal history of nicotine dependence: Secondary | ICD-10-CM

## 2019-06-04 DIAGNOSIS — Z122 Encounter for screening for malignant neoplasm of respiratory organs: Secondary | ICD-10-CM

## 2019-06-04 NOTE — Telephone Encounter (Signed)
Pt informed of CT results per Eric Form, NP.  PT verbalized understanding.  Copy sent to PCP.  Order placed for 1 yr f/u CT. Televisit scheduled with Eric Form, NP to discuss CT results.

## 2019-06-05 ENCOUNTER — Other Ambulatory Visit: Payer: Self-pay

## 2019-06-05 ENCOUNTER — Ambulatory Visit (INDEPENDENT_AMBULATORY_CARE_PROVIDER_SITE_OTHER): Payer: 59 | Admitting: Acute Care

## 2019-06-05 ENCOUNTER — Encounter: Payer: Self-pay | Admitting: Acute Care

## 2019-06-05 DIAGNOSIS — F17210 Nicotine dependence, cigarettes, uncomplicated: Secondary | ICD-10-CM

## 2019-06-05 DIAGNOSIS — J439 Emphysema, unspecified: Secondary | ICD-10-CM | POA: Diagnosis not present

## 2019-06-05 NOTE — Patient Instructions (Addendum)
It was good to talk with you today We will schedule PFT's November 20th at 2 pm  You will be instructed regarding COVID testing prior Continue Anoro Ellipta once daily.   Rinse after use  Continue albuterol as needed for shortness of breath and wheezing We will schedule you with Dr. Valeta Harms  Nov 20th at 3 pm.  Sputum Culture to be collected Nov 20th .for baseline Follow up with GI Please contact office for sooner follow up if symptoms do not improve or worsen or seek emergency care   Follow Up Instructions: PFT's July 10, 2019 at 2 pm Follow up with Dr. Valeta Harms Jul 10, 2019 at 3 pm

## 2019-06-05 NOTE — Progress Notes (Signed)
Virtual Visit via Telephone Note  I connected with Shaun [name] on 06/05/19 at  3:30 PM EDT by telephone and verified that I am speaking with the correct person using two identifiers.  Location: Patient: At home Provider: At the office at [address], [place], [place], [address]   I discussed the limitations, risks, security and privacy concerns of performing an evaluation and management service by telephone and the availability of in person appointments. I also discussed with the patient that there may be a patient responsible charge related to this service. The patient expressed understanding and agreed to proceed.   History of Present Illness: Pt seen through the lung cancer screening program.He is a current every day smoker with a 39 pack year smoking history. His scan showed Lung RADS 2: nodules that are benign in appearance and behavior with a very low likelihood of becoming a clinically active cancer due to size or lack of growth. Recommendation per radiology is for a repeat LDCT in 12 months. There was an incidental finding of some abnormal clusters of micro nodularity in the deep posterior right costophrenic sulcus, likely sequelae of atypical infection. I called him today to discuss this, as I was concerned he may be silently aspirating. 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 He states that since he had this done he has had no further issues with his swallow. He does go for regular follow up.  He denies any clinical symptoms concerning for infection. We will culture sputum for micro ID . He has follow up with Dr. Valeta Harms 07/19/2019, with PFT's first. He endorses improvement in his dyspnea with initiation of Anoro Therapy.He denies fever, chest pain, orthopnea or hemoptysis.   Observations/Objective: 05/22/2019 CT Chest for Lung Cancer Screening Lung-RADS 2, benign  appearance or behavior. Continue annual screening with low-dose chest CT without contrast in 12 months. 2. Small subtle cluster of micro nodularity in the deep posterior right costophrenic sulcus, likely sequelae of atypical infection. Aspiration could have this appearance in the appropriate clinical setting. 3.  Aortic Atherosclerois (ICD10-170.0) 4.  Emphysema. [id number])  Assessment and Plan: Dyspnea on Exertion  Plan Please work on quitting smoking We will schedule PFT's November 20th at 2 pm  You will be instructed regarding COVID testing prior Continue Anoro Ellipta once daily.   Rinse after use  Continue albuterol as needed for shortness of breath and wheezing We will schedule you with Dr. Valeta Harms  Nov 20th at 3 pm.  Sputum Culture to be collected Nov 20th .for baseline  Follow Up Instructions: PFT's July 10, 2019 at 2 pm Follow up with Dr. Valeta Harms Jul 10, 2019 at 3 pm   I discussed the assessment and treatment plan with the patient. The patient was provided an opportunity to ask questions and all were answered. The patient agreed with the plan and demonstrated an understanding of the instructions.   The patient was advised to call back or seek an in-person evaluation if the symptoms worsen or if the condition fails to improve as anticipated.  I provided 30 minutes of non-face-to-face time during this encounter.   Magdalen Spatz, NP 06/05/2019 5:19 PM

## 2019-06-10 ENCOUNTER — Ambulatory Visit: Payer: 59 | Admitting: Family Medicine

## 2019-06-13 ENCOUNTER — Other Ambulatory Visit: Payer: Self-pay | Admitting: Family Medicine

## 2019-06-18 ENCOUNTER — Other Ambulatory Visit: Payer: Self-pay

## 2019-06-18 ENCOUNTER — Encounter: Payer: Self-pay | Admitting: Cardiology

## 2019-06-18 ENCOUNTER — Ambulatory Visit: Payer: 59 | Admitting: Cardiology

## 2019-06-18 VITALS — BP 118/72 | HR 82 | Ht 68.0 in | Wt 230.0 lb

## 2019-06-18 DIAGNOSIS — I10 Essential (primary) hypertension: Secondary | ICD-10-CM

## 2019-06-18 DIAGNOSIS — I2510 Atherosclerotic heart disease of native coronary artery without angina pectoris: Secondary | ICD-10-CM | POA: Diagnosis not present

## 2019-06-18 DIAGNOSIS — E785 Hyperlipidemia, unspecified: Secondary | ICD-10-CM

## 2019-06-18 DIAGNOSIS — Z7901 Long term (current) use of anticoagulants: Secondary | ICD-10-CM | POA: Diagnosis not present

## 2019-06-18 DIAGNOSIS — I482 Chronic atrial fibrillation, unspecified: Secondary | ICD-10-CM | POA: Diagnosis not present

## 2019-06-18 DIAGNOSIS — I5032 Chronic diastolic (congestive) heart failure: Secondary | ICD-10-CM

## 2019-06-18 DIAGNOSIS — K219 Gastro-esophageal reflux disease without esophagitis: Secondary | ICD-10-CM

## 2019-06-18 DIAGNOSIS — J439 Emphysema, unspecified: Secondary | ICD-10-CM

## 2019-06-18 DIAGNOSIS — Z9861 Coronary angioplasty status: Secondary | ICD-10-CM

## 2019-06-18 DIAGNOSIS — E119 Type 2 diabetes mellitus without complications: Secondary | ICD-10-CM

## 2019-06-18 DIAGNOSIS — G4733 Obstructive sleep apnea (adult) (pediatric): Secondary | ICD-10-CM

## 2019-06-18 MED ORDER — FUROSEMIDE 20 MG PO TABS: ORAL_TABLET | ORAL | 1 refills | Status: DC

## 2019-06-18 NOTE — Patient Instructions (Signed)
Medication Instructions:  Your physician recommends that you continue on your current medications as directed. Please refer to the Current Medication list given to you today. *If you need a refill on your cardiac medications before your next appointment, please call your pharmacy*  Lab Work: NONE  If you have labs (blood work) drawn today and your tests are completely normal, you will receive your results only by: Marland Kitchen MyChart Message (if you have MyChart) OR . A paper copy in the mail If you have any lab test that is abnormal or we need to change your treatment, we will call you to review the results.  Testing/Procedures: NONE  Follow-Up: At [facility], you and your health needs are our priority.  As part of our continuing mission to provide you with exceptional heart care, we have created designated Provider Care Teams.  These Care Teams include your primary Cardiologist (physician) and Advanced Practice Providers (APPs -  Physician Assistants and Nurse Practitioners) who all work together to provide you with the care you need, when you need it.  Your next appointment:   6 months  The format for your next appointment:   In Person  Provider:   You may see Kirk Ruths, MD or one of the following Advanced Practice Providers on your designated Care Team:    Kerin Ransom, PA-C  [place], [place]  Coletta Memos, [place]   Other Instructions

## 2019-06-18 NOTE — Assessment & Plan Note (Signed)
On C-pap 

## 2019-06-18 NOTE — Assessment & Plan Note (Signed)
Eliquis. CHA2DS2 VASc=3

## 2019-06-18 NOTE — Assessment & Plan Note (Signed)
Dr Valeta Harms saw in  Sept 2020- CT and PFTs pending

## 2019-06-18 NOTE — Assessment & Plan Note (Signed)
Followed by Dr Oneida Alar in [place]- pt to have capsul endoscopy for persistent anemia.

## 2019-06-18 NOTE — Assessment & Plan Note (Signed)
PCP follows 

## 2019-06-18 NOTE — Assessment & Plan Note (Addendum)
onset in 10/2004; recurred 09/2008- rate and symptoms of DOE improved on increased beta blocker

## 2019-06-18 NOTE — Assessment & Plan Note (Signed)
[place] Nov 2016- cath [facility] lesion-DES Re look cath for chest pain 06/08/17- non obstructive CAD

## 2019-06-18 NOTE — Assessment & Plan Note (Signed)
>>  ASSESSMENT AND PLAN FOR CHRONIC ANTICOAGULATION WRITTEN ON 06/18/2019 10:26 AM BY KILROY, LUKE K, PA-C  Eliquis. [id number] VASc=3

## 2019-06-18 NOTE — Assessment & Plan Note (Signed)
>>  ASSESSMENT AND PLAN FOR GERD (GASTROESOPHAGEAL REFLUX DISEASE) WRITTEN ON 06/18/2019 10:28 AM BY Kerin Ransom K, PA-C  Followed by Dr Oneida Alar in [place]- pt to have capsul endoscopy for persistent anemia.

## 2019-06-18 NOTE — Progress Notes (Signed)
Cardiology Office Note:    Date:  06/18/2019   ID:  Shaun [name], DOB May 18, 1956, MRN [id number]  PCP:  Mikey Kirschner, MD  Cardiologist:  Kirk Ruths, MD  Electrophysiologist:  None   Referring MD: Mikey Kirschner, MD   No chief complaint on file.   History of Present Illness:    Shaun [name] is a 63 y.o. male with a hx of chronic atrial fibrillation on Eliquis, hypertension, non-insulin-dependent diabetes, COPD, and coronary disease.  He had a circumflex stent placed in November 2016.  Catheterization in October 2018 revealed a patent circumflex stent and a 50% LAD.  In addition to the above he has a history of gastroesophageal reflux disease.  In September 2019 he had a Schatzki's ring dilated.  He had documented gastritis and multiple polyps removed.  In January 2020 he had a small bowel perforation and underwent surgery for that.  He is followed by Dr. Oneida Alar in [place].   The patient last saw Dr. Stanford Breed in July 2020.  He had been having some dyspnea on exertion.  Previously this had been attributed to to diastolic heart failure although his proBNP was not elevated.  Dr. Stanford Breed increase the patient's metoprolol.  Follow-up echocardiogram was done in August 2020 which showed his ejection fraction to be 60 to 65% with severe basilar septal hypertrophy and mild to moderate LA enlargement.  An outpatient Holter was done in August 2020 after his metoprolol was increased.  Dr. Stanford Breed reviewed this and felt like his rate was pretty well controlled.  Since we saw him last the patient has done well from a cardiac standpoint.  He has had other issues, he is being seen by [facility] for COPD, and is being followed closely by GI for anemia.  He tells me he will be scheduled for a capsule endoscopy.  He also told me he is decided to apply for disability because of his multiple medical problems.  Past Medical History:  Diagnosis Date  . Anemia   .  Arteriosclerotic cardiovascular disease (ASCVD)    Nonobstructive; cath in 2000: 30-40% mid LAD and proximal RCA;normal EF. stress nuclear in 2006-subtle inferoseptal hypoperfusion with reversibility; negative stress EKG; good exercise tolerance  . Asthma   . Cancer ([place])    skin cancer  . CHF (congestive heart failure) ([place])   . Colon polyps    30 colon polyps found on first colonoscopy  . COPD (chronic obstructive pulmonary disease) ([place])   . Diabetes mellitus without complication ([place])   . Diverticulitis   . DJD (degenerative joint disease)   . GERD (gastroesophageal reflux disease)   . History of kidney stones   . Hyperlipidemia   . Hypertension   . Insomnia   . Obstructive sleep apnea 12/2009   01/26/2010 AHI 83/hr  . Paroxysmal atrial fibrillation ([place]) 10/2004   Onset in 10/2004; recurred 09/2008  . PUD (peptic ulcer disease)    1980s  . RLS (restless legs syndrome)   . Sinusitis     Past Surgical History:  Procedure Laterality Date  . BIOPSY  07/17/2018   Procedure: BIOPSY;  Surgeon: Danie Binder, MD;  [facility];  Service: Endoscopy;;  colon  . BOWEL RESECTION  09/17/2018   SMALL BOWEL RESECTION: 71 CM   . CARDIAC CATHETERIZATION N/A 07/21/2015   Procedure: Left Heart Cath and Coronary Angiography;  Surgeon: Peter M Martinique, MD;  Location: [place] [facility];  Service: Cardiovascular;  Laterality: N/A;  .  CARDIAC CATHETERIZATION N/A 07/21/2015   Procedure: Coronary Stent Intervention;  Surgeon: Peter M Martinique, MD;  Location: [place] [facility];  Service: Cardiovascular;  Laterality: N/A;  . CIRCUMCISION N/A 04/05/2019   Procedure: CIRCUMCISION ADULT;  Surgeon: Irine Seal, MD;  Location: [facility];  Service: Urology;  Laterality: N/A;  . COLONOSCOPY N/A 05/19/2014   Dr. Barnie Alderman diverticulosis/moderate external hemorrhoids, >20 simple adenomas. Genetic screening negative.   . COLONOSCOPY WITH PROPOFOL N/A 07/17/2018   Dr. Oneida Alar: Diverticulosis,  external/internal hemorrhoids, 32 colon polyps removed.  ten tubular adenomas removed with no high-grade dysplasia.  Advised to have surveillance colonoscopy in 3 years.  . ESOPHAGOGASTRODUODENOSCOPY (EGD) WITH PROPOFOL N/A 07/17/2018   Dr. Oneida Alar: Low-grade narrowing Schatzki ring at the GE junction status post dilation.  Gastritis.  Biopsy with mild nonspecific reactive gastropathy.  No H. pylori.  Marland Kitchen HERNIA REPAIR  1986   Left inguinal  . INTRAVASCULAR PRESSURE WIRE/FFR STUDY Left 06/08/2017   Procedure: INTRAVASCULAR PRESSURE WIRE/FFR STUDY;  Surgeon: Nelva Bush, MD;  Location: [place] [facility];  Service: Cardiovascular;  Laterality: Left;  LAD and CFX  . LAPAROTOMY N/A 09/17/2018   Procedure: EXPLORATORY LAPAROTOMY;  Surgeon: Virl Cagey, MD;  Location: [facility];  Service: General;  Laterality: N/A;  . LEFT HEART CATH AND CORONARY ANGIOGRAPHY N/A 06/08/2017   Procedure: LEFT HEART CATH AND CORONARY ANGIOGRAPHY;  Surgeon: Nelva Bush, MD;  Location: [place] [facility];  Service: Cardiovascular;  Laterality: N/A;  . POLYPECTOMY  07/17/2018   Procedure: POLYPECTOMY;  Surgeon: Danie Binder, MD;  [facility];  Service: Endoscopy;;  colon  . ROTATOR CUFF REPAIR     Right  . SAVORY DILATION N/A 07/17/2018   Procedure: SAVORY DILATION;  Surgeon: Danie Binder, MD;  [facility];  Service: Endoscopy;  Laterality: N/A;    Current Medications: Current Meds  Medication Sig  . atorvastatin (LIPITOR) 80 MG tablet TAKE 1 TABLET(80 MG) BY MOUTH EVERY EVENING  . buPROPion (WELLBUTRIN XL) 150 MG 24 hr tablet Take 3 tablets once daily  . docusate sodium (COLACE) 100 MG capsule Take 100 mg by mouth as needed for mild constipation.  Marland Kitchen ELIQUIS 5 MG TABS tablet TAKE 1 TABLET(5 MG) BY MOUTH TWICE DAILY  . escitalopram (LEXAPRO) 20 MG tablet Take 1 tablet (20 mg total) by mouth daily. TAKE 1 TABLET(20 MG) BY MOUTH DAILY  . furosemide (LASIX) 20 MG tablet TAKE 2  TABLETS(40 MG) BY MOUTH DAILY  . HYDROcodone-acetaminophen (NORCO/VICODIN) 5-325 MG tablet Take one tid prn pain  . lisinopril (ZESTRIL) 2.5 MG tablet Take 1 tablet (2.5 mg total) by mouth daily.  . meclizine (ANTIVERT) 25 MG tablet Take 1 tablet (25 mg total) by mouth 3 (three) times daily as needed for dizziness.  . metFORMIN (GLUCOPHAGE) 500 MG tablet TAKE 1 TABLET(500 MG) BY MOUTH TWICE DAILY WITH A MEAL  . metoprolol tartrate (LOPRESSOR) 100 MG tablet Take 1 tablet (100 mg total) by mouth 2 (two) times daily.  . nitroGLYCERIN (NITROSTAT) 0.4 MG SL tablet Place 1 tablet (0.4 mg total) under the tongue every 5 (five) minutes as needed.  Marland Kitchen omeprazole (PRILOSEC) 40 MG capsule Take 1 capsule (40 mg total) by mouth daily.  . ondansetron (ZOFRAN-ODT) 4 MG disintegrating tablet Take 1 tablet (4 mg total) by mouth every 6 (six) hours as needed for nausea.  . potassium chloride SA (K-DUR) 20 MEQ tablet Take 1 tablet (20 mEq total) by mouth daily.  Marland Kitchen rOPINIRole (  REQUIP) 3 MG tablet TAKE 1 TABLET(3 MG) BY MOUTH AT BEDTIME  . sildenafil (VIAGRA) 100 MG tablet Take 1 tablet 30 minutes before activity  . sodium chloride (OCEAN) 0.65 % SOLN nasal spray Place 1 spray into both nostrils daily as needed for congestion.   . sucralfate (CARAFATE) 1 g tablet TAKE 1 TABLET BY MOUTH THREE TIMES DAILY BEFORE MEALS  . tetrahydrozoline (VISINE) 0.05 % ophthalmic solution Place 2 drops into both eyes daily as needed (for dry eyes).  . traZODone (DESYREL) 50 MG tablet TAKE 2 TABLETS(100 MG) BY MOUTH AT BEDTIME  . triamcinolone cream (KENALOG) 0.1 % Apply 1 application topically 2 (two) times daily.  Marland Kitchen umeclidinium-vilanterol (ANORO ELLIPTA) 62.5-25 MCG/INH AEPB Inhale 1 puff into the lungs daily.  . VENTOLIN HFA 108 (90 Base) MCG/ACT inhaler INHALE 2 PUFFS INTO THE LUNGS EVERY 6 HOURS AS NEEDED FOR WHEEZING OR SHORTNESS OF BREATH     Allergies:   Patient has no known allergies.   Social History   Socioeconomic  History  . Marital status: Married    Spouse name: Not on file  . Number of children: 5  . Years of education: Not on file  . Highest education level: Not on file  Occupational History  . Occupation: employed    Fish farm manager: [employer]    Comment: full-time  Social Needs  . Financial resource strain: Not on file  . Food insecurity    Worry: Not on file    Inability: Not on file  . Transportation needs    Medical: Not on file    Non-medical: Not on file  Tobacco Use  . Smoking status: Current Every Day Smoker    Packs/day: 1.00    Years: 39.00    Pack years: 39.00    Types: Cigarettes    Start date: 07/24/1970  . Smokeless tobacco: Never Used  Substance and Sexual Activity  . Alcohol use: No    Alcohol/week: 0.0 standard drinks  . Drug use: No  . Sexual activity: Yes    Partners: Female  Lifestyle  . Physical activity    Days per week: Not on file    Minutes per session: Not on file  . Stress: Not on file  Relationships  . Social Herbalist on phone: Not on file    Gets together: Not on file    Attends religious service: Not on file    Active member of club or organization: Not on file    Attends meetings of clubs or organizations: Not on file    Relationship status: Not on file  Other Topics Concern  . Not on file  Social History Narrative  . Not on file     Family History: The patient's family history includes Brain cancer in his maternal uncle; Breast cancer in his cousin; Breast cancer (age of onset: 80) in his mother; Cancer in his cousin and maternal uncle; Diabetes in his brother; Heart attack in his father; Hypertension in his mother; Skin cancer (age of onset: 70) in his sister. There is no history of Colon cancer.  ROS:   Please see the history of present illness.     All other systems reviewed and are negative.  EKGs/Labs/Other Studies Reviewed:    The following studies were reviewed today: Echo Aug 2020 Holter Aug 2020  EKG:  EKG is  ordered today.  The ekg ordered today demonstrates AF with CVR- 82  Recent Labs: 09/20/2018: Magnesium 2.1 03/28/2019: ALT 20; NT-Pro  BNP 177 04/03/2019: BUN 21; Creatinine, Ser 1.08; Potassium 4.2; Sodium 136 05/21/2019: Hemoglobin 12.0; Platelets 171  Recent Lipid Panel    Component Value Date/Time   CHOL 134 03/28/2019 0830   TRIG 202 (H) 03/28/2019 0830   HDL 27 (L) 03/28/2019 0830   CHOLHDL 5.0 03/28/2019 0830   CHOLHDL 4.9 07/10/2018 0923   VLDL 49 (H) 07/10/2018 0923   LDLCALC 67 03/28/2019 0830   LDLCALC 75 12/15/2017 0744    Physical Exam:    VS:  BP 118/72   Pulse 82   Ht 5\' 8"  (1.727 m)   Wt 230 lb (104.3 kg)   SpO2 98%   BMI 34.97 kg/m     Wt Readings from Last 3 Encounters:  06/18/19 230 lb (104.3 kg)  05/22/19 227 lb 12.8 oz (103.3 kg)  05/07/19 228 lb (103.4 kg)     GEN: Overweight Caucasian male, well developed in no acute distress HEENT: Normal NECK: No JVD; No carotid bruits LYMPHATICS: No lymphadenopathy CARDIAC: RRR, no murmurs, rubs, gallops RESPIRATORY:  Clear to auscultation without rales, wheezing or rhonchi  ABDOMEN: Soft, non-tender, non-distended MUSCULOSKELETAL:  No edema; No deformity  SKIN: Warm and dry NEUROLOGIC:  Alert and oriented x 3 PSYCHIATRIC:  Normal affect   ASSESSMENT:    Chronic atrial fibrillation ([place]) onset in 10/2004; recurred 09/2008- rate and symptoms of DOE improved on increased beta blocker   Chronic anticoagulation Eliquis. CHA2DS2 VASc=3  CAD S/P percutaneous coronary angioplasty [place] Nov 2016- cath [facility] lesion-DES Re look cath for chest pain 06/08/17- non obstructive CAD  COPD (chronic obstructive pulmonary disease) ([place]) Dr Valeta Harms saw in  Sept 2020- CT and PFTs pending  Non-insulin treated type 2 diabetes mellitus ([place]) PCP follows  Obstructive sleep apnea On C-pap  GERD (gastroesophageal reflux disease) Followed by Dr Oneida Alar in [place]- pt to have capsul endoscopy for persistent anemia.   PLAN:     Same cardiac Rx.  Hopefully he will continue to tolerate current beta blocker from a pulmonary standpoint.  F/U Dr Stanford Breed in 6 months.   Medication Adjustments/Labs and Tests Ordered: Current medicines are reviewed at length with the patient today.  Concerns regarding medicines are outlined above.  Orders Placed This Encounter  Procedures  . EKG 12-Lead   No orders of the defined types were placed in this encounter.   Patient Instructions  Medication Instructions:  Your physician recommends that you continue on your current medications as directed. Please refer to the Current Medication list given to you today. *If you need a refill on your cardiac medications before your next appointment, please call your pharmacy*  Lab Work: NONE  If you have labs (blood work) drawn today and your tests are completely normal, you will receive your results only by: Marland Kitchen MyChart Message (if you have MyChart) OR . A paper copy in the mail If you have any lab test that is abnormal or we need to change your treatment, we will call you to review the results.  Testing/Procedures: NONE  Follow-Up: At [facility], you and your health needs are our priority.  As part of our continuing mission to provide you with exceptional heart care, we have created designated Provider Care Teams.  These Care Teams include your primary Cardiologist (physician) and Advanced Practice Providers (APPs -  Physician Assistants and Nurse Practitioners) who all work together to provide you with the care you need, when you need it.  Your next appointment:   6 months  The format for  your next appointment:   In Person  Provider:   You may see Kirk Ruths, MD or one of the following Advanced Practice Providers on your designated Care Team:    Kerin Ransom, PA-C  [place], [place]  Coletta Memos, FNP   Other Instructions     Signed, Kerin Ransom, [place]  06/18/2019 10:28 AM    [place]

## 2019-06-24 ENCOUNTER — Encounter (HOSPITAL_COMMUNITY)
Admission: RE | Disposition: A | Discharge status: Home / Self Care | Payer: Self-pay | Source: Home / Self Care | Attending: Gastroenterology

## 2019-06-24 ENCOUNTER — Other Ambulatory Visit: Payer: Self-pay

## 2019-06-24 ENCOUNTER — Ambulatory Visit (HOSPITAL_COMMUNITY)
Admission: RE | Admit: 2019-06-24 | Discharge: 2019-06-24 | Disposition: A | Discharge status: Home / Self Care | Payer: 59 | Attending: Gastroenterology | Admitting: Gastroenterology

## 2019-06-24 DIAGNOSIS — D509 Iron deficiency anemia, unspecified: Secondary | ICD-10-CM | POA: Diagnosis present

## 2019-06-24 DIAGNOSIS — K635 Polyp of colon: Secondary | ICD-10-CM | POA: Insufficient documentation

## 2019-06-24 DIAGNOSIS — K922 Gastrointestinal hemorrhage, unspecified: Secondary | ICD-10-CM | POA: Diagnosis present

## 2019-06-24 HISTORY — PX: GIVENS CAPSULE STUDY: SHX5432

## 2019-06-24 SURGERY — IMAGING PROCEDURE, GI TRACT, INTRALUMINAL, VIA CAPSULE

## 2019-06-25 ENCOUNTER — Other Ambulatory Visit: Payer: Self-pay | Admitting: *Deleted

## 2019-06-25 MED ORDER — SILDENAFIL CITRATE 100 MG PO TABS: ORAL_TABLET | ORAL | 3 refills | Status: DC

## 2019-06-27 ENCOUNTER — Encounter (HOSPITAL_COMMUNITY): Payer: Self-pay | Admitting: Gastroenterology

## 2019-06-29 ENCOUNTER — Telehealth: Payer: Self-pay | Admitting: Gastroenterology

## 2019-06-29 NOTE — Procedures (Addendum)
INDICATION:  OBSCURE GI BLEED/ANEMIA  PATIENT DATA: GASTRIC PASSAGE TIME: 10 m, SB PASSAGE TIME: 1 H 76 m  RESULTS: LIMITED views of gastric mucosa due to retained contents.  No ULCERS, masses or AVMs seen.  LIMITED VIEWS OF THE COLON DUE TO RETAINED CONTENTS. No old blood or fresh blood in the stomach, small bowel, or colon.  DIAGNOSIS: NORMAL GIVENS STUDY. MICROCYTIC ANEMIA DUE TO COLON POLYPS IN THE SETTING OF NOACs.  Plan: 1. CBC IN 3 MOS 2. OPV IN  MOS.

## 2019-06-29 NOTE — Telephone Encounter (Signed)
PLEASE CALL PT. HIS GIVENS STUDY IS NORMAL. HIS LAST HB WAS 12(NORMAL) IN SEP 2020. CHECK CBC IN JAN 2021. FOLLOW UP IN 6 MOS.

## 2019-07-01 ENCOUNTER — Other Ambulatory Visit: Payer: Self-pay

## 2019-07-01 ENCOUNTER — Encounter: Payer: Self-pay | Admitting: Gastroenterology

## 2019-07-01 DIAGNOSIS — D649 Anemia, unspecified: Secondary | ICD-10-CM

## 2019-07-01 NOTE — Telephone Encounter (Signed)
PT is aware of results. Lab order on file for Jan 2021.

## 2019-07-01 NOTE — Progress Notes (Signed)
cbc

## 2019-07-01 NOTE — Telephone Encounter (Signed)
PATIENT SCHEDULED AND LETTER SENT  °

## 2019-07-05 ENCOUNTER — Other Ambulatory Visit: Payer: Self-pay | Admitting: Family Medicine

## 2019-07-10 ENCOUNTER — Telehealth: Payer: Self-pay | Admitting: Family Medicine

## 2019-07-10 MED ORDER — HYDROCODONE-ACETAMINOPHEN 5-325 MG PO TABS: ORAL_TABLET | ORAL | 0 refills | Status: DC

## 2019-07-10 NOTE — Telephone Encounter (Signed)
Patient is requesting refill on hydrocodone 5/325 will be out before his appointment on 11/13. [facility] Please Advise

## 2019-07-10 NOTE — Telephone Encounter (Signed)
Last seen 05/02/19

## 2019-07-10 NOTE — Telephone Encounter (Signed)
Script pended and ready to sign and then I will call pt

## 2019-07-10 NOTE — Telephone Encounter (Signed)
ok 

## 2019-07-11 ENCOUNTER — Ambulatory Visit: Payer: 59 | Admitting: Family Medicine

## 2019-07-12 ENCOUNTER — Ambulatory Visit (INDEPENDENT_AMBULATORY_CARE_PROVIDER_SITE_OTHER): Payer: 59 | Admitting: Family Medicine

## 2019-07-12 ENCOUNTER — Other Ambulatory Visit: Payer: Self-pay

## 2019-07-12 DIAGNOSIS — Z79891 Long term (current) use of opiate analgesic: Secondary | ICD-10-CM | POA: Diagnosis not present

## 2019-07-12 MED ORDER — ROPINIROLE HCL 3 MG PO TABS: ORAL_TABLET | ORAL | 1 refills | Status: DC

## 2019-07-12 MED ORDER — ATORVASTATIN CALCIUM 80 MG PO TABS: ORAL_TABLET | ORAL | 1 refills | Status: DC

## 2019-07-12 MED ORDER — METFORMIN HCL 500 MG PO TABS: ORAL_TABLET | ORAL | 1 refills | Status: DC

## 2019-07-12 MED ORDER — BUPROPION HCL ER (XL) 150 MG PO TB24: ORAL_TABLET | ORAL | 1 refills | Status: DC

## 2019-07-12 MED ORDER — HYDROCODONE-ACETAMINOPHEN 5-325 MG PO TABS: ORAL_TABLET | ORAL | 0 refills | Status: DC

## 2019-07-12 MED ORDER — ESCITALOPRAM OXALATE 20 MG PO TABS: 20.0000 mg | ORAL_TABLET | Freq: Every day | ORAL | 1 refills | Status: DC

## 2019-07-12 MED ORDER — OMEPRAZOLE 40 MG PO CPDR: 40.0000 mg | DELAYED_RELEASE_CAPSULE | Freq: Every day | ORAL | 1 refills | Status: DC

## 2019-07-12 NOTE — Progress Notes (Signed)
   Subjective:    Patient ID: Shaun [name], male    DOB: Sep 19, 1955, 63 y.o.   MRN: [id number]  HPI This patient was seen today for chronic pain. Takes  For hip and back pain  The medication list was reviewed and updated.   -Compliance with medication: takes 1 -3 a day  - Number patient states they take daily: 1 -3   -when was the last dose patient took? Yesterday  The patient was advised the importance of maintaining medication and not using illegal substances with these.  Here for refills and follow up  The patient was educated that we can provide 3 monthly scripts for their medication, it is their responsibility to follow the instructions.  Side effects or complications from medications: none  Patient is aware that pain medications are meant to minimize the severity of the pain to allow their pain levels to improve to allow for better function. They are aware of that pain medications cannot totally remove their pain.  Due for UDT ( at least once per year) : last one 10/09/18  Wants to extend work note. Has been out of work since covid started here and he was told it was worse at his job.   Blood sugar has been running good. Highest was 142. Normally 122-128.  Virtual Visit via Telephone Note  I connected with Allah Slaten Swarm on 07/12/19 at 10:00 AM EST by telephone and verified that I am speaking with the correct person using two identifiers.  Location: Patient: home Provider: office    I discussed the limitations, risks, security and privacy concerns of performing an evaluation and management service by telephone and the availability of in person appointments. I also discussed with the patient that there may be a patient responsible charge related to this service. The patient expressed understanding and agreed to proceed.   History of Present Illness:    Observations/Objective:   Assessment and Plan:   Follow Up Instructions:    I discussed the  assessment and treatment plan with the patient. The patient was provided an opportunity to ask questions and all were answered. The patient agreed with the plan and demonstrated an understanding of the instructions.   The patient was advised to call back or seek an in-person evaluation if the symptoms worsen or if the condition fails to improve as anticipated.  I provided 20 minutes of non-face-to-face time during this encounter.        Review of Systems No headache, no major weight loss or weight gain, no chest pain no back pain abdominal pain no change in bowel habits complete ROS otherwise negative     Objective:   Physical Exam  Virtual      Assessment & Plan:  Impression chronic pain  Impression: Chronic pain. Patient compliant with medication. No substantial side effects. [place] controlled substance registry reviewed to ensure compliance and proper use of medication. Patient aware goal of medicine is not complete resolution of pain but to control his symptoms to improve his functional capacity. Aware of potential adverse side effects/follow-up in 3 months

## 2019-07-14 ENCOUNTER — Encounter: Payer: Self-pay | Admitting: Family Medicine

## 2019-07-16 ENCOUNTER — Other Ambulatory Visit: Payer: Self-pay

## 2019-07-16 ENCOUNTER — Other Ambulatory Visit (HOSPITAL_COMMUNITY)
Admission: RE | Admit: 2019-07-16 | Discharge: 2019-07-16 | Disposition: A | Discharge status: Home / Self Care | Payer: 59 | Source: Ambulatory Visit | Attending: Pulmonary Disease | Admitting: Pulmonary Disease

## 2019-07-16 DIAGNOSIS — Z01812 Encounter for preprocedural laboratory examination: Secondary | ICD-10-CM | POA: Insufficient documentation

## 2019-07-16 DIAGNOSIS — Z20828 Contact with and (suspected) exposure to other viral communicable diseases: Secondary | ICD-10-CM | POA: Insufficient documentation

## 2019-07-16 LAB — SARS CORONAVIRUS 2 (TAT 6-24 HRS): SARS Coronavirus 2: NEGATIVE

## 2019-07-19 ENCOUNTER — Ambulatory Visit: Payer: 59 | Admitting: Pulmonary Disease

## 2019-07-19 ENCOUNTER — Ambulatory Visit (INDEPENDENT_AMBULATORY_CARE_PROVIDER_SITE_OTHER): Payer: 59 | Admitting: Pulmonary Disease

## 2019-07-19 ENCOUNTER — Other Ambulatory Visit: Payer: Self-pay

## 2019-07-19 VITALS — BP 118/78 | HR 100 | Temp 97.2°F | Ht 68.0 in | Wt 228.0 lb

## 2019-07-19 DIAGNOSIS — R0609 Other forms of dyspnea: Secondary | ICD-10-CM

## 2019-07-19 DIAGNOSIS — R0600 Dyspnea, unspecified: Secondary | ICD-10-CM

## 2019-07-19 DIAGNOSIS — Z720 Tobacco use: Secondary | ICD-10-CM

## 2019-07-19 DIAGNOSIS — Z9989 Dependence on other enabling machines and devices: Secondary | ICD-10-CM

## 2019-07-19 DIAGNOSIS — R062 Wheezing: Secondary | ICD-10-CM

## 2019-07-19 DIAGNOSIS — I482 Chronic atrial fibrillation, unspecified: Secondary | ICD-10-CM | POA: Diagnosis not present

## 2019-07-19 DIAGNOSIS — R0602 Shortness of breath: Secondary | ICD-10-CM | POA: Diagnosis not present

## 2019-07-19 DIAGNOSIS — Z7901 Long term (current) use of anticoagulants: Secondary | ICD-10-CM

## 2019-07-19 DIAGNOSIS — J439 Emphysema, unspecified: Secondary | ICD-10-CM

## 2019-07-19 DIAGNOSIS — G4733 Obstructive sleep apnea (adult) (pediatric): Secondary | ICD-10-CM

## 2019-07-19 LAB — PULMONARY FUNCTION TEST
DL/VA % pred: 86 %
DL/VA: 3.65 ml/min/mmHg/L
DLCO unc % pred: 78 %
DLCO unc: 20.18 ml/min/mmHg
FEF 25-75 Post: 2.71 L/sec
FEF 25-75 Pre: 1.88 L/sec
FEF2575-%Change-Post: 43 %
FEF2575-%Pred-Post: 100 %
FEF2575-%Pred-Pre: 70 %
FEV1-%Change-Post: 8 %
FEV1-%Pred-Post: 92 %
FEV1-%Pred-Pre: 85 %
FEV1-Post: 3.05 L
FEV1-Pre: 2.8 L
FEV1FVC-%Change-Post: 7 %
FEV1FVC-%Pred-Pre: 94 %
FEV6-%Change-Post: 3 %
FEV6-%Pred-Post: 96 %
FEV6-%Pred-Pre: 92 %
FEV6-Post: 4 L
FEV6-Pre: 3.85 L
FEV6FVC-%Change-Post: 1 %
FEV6FVC-%Pred-Post: 105 %
FEV6FVC-%Pred-Pre: 103 %
FVC-%Change-Post: 1 %
FVC-%Pred-Post: 91 %
FVC-%Pred-Pre: 89 %
FVC-Post: 4 L
FVC-Pre: 3.93 L
Post FEV1/FVC ratio: 76 %
Post FEV6/FVC ratio: 100 %
Pre FEV1/FVC ratio: 71 %
Pre FEV6/FVC Ratio: 99 %
RV % pred: 80 %
RV: 1.76 L
TLC % pred: 87 %
TLC: 5.76 L

## 2019-07-19 MED ORDER — ALBUTEROL SULFATE HFA 108 (90 BASE) MCG/ACT IN AERS: INHALATION_SPRAY | RESPIRATORY_TRACT | 11 refills | Status: DC

## 2019-07-19 NOTE — Patient Instructions (Addendum)
Thank you for visiting Dr. Valeta Harms at [facility]. Today we recommend the following:  Please set patient up with Dr. Ander Slade or [facility] for next available sleep consultation for OSA on CPAP. Needs new machine has afib.   Continue albuterol as needed but be in mind that it may effect your heart rate. [facility].   Stop anoro ellipta. Reviewed PFTs today and you do not have COPD.   Return in about 10 months (around 05/18/2020), or if symptoms worsen or fail to improve. After LDCT completed.     Please do your part to reduce the spread of COVID-19.

## 2019-07-19 NOTE — Progress Notes (Signed)
Full PFT performed today. °

## 2019-07-19 NOTE — Progress Notes (Signed)
Synopsis: Referred in September 2020 for dyspnea wheezing by Mikey Kirschner, MD  Subjective:   PATIENT ID: Shaun [name] GENDER: male DOB: 10-Jul-1956, MRN: [id number]  Chief Complaint  Patient presents with  . Follow-up    PFT today. Reports being dizzy. States the PFT may have aggravated his A-Fib. He feels very faint and light headed.     63 year old gentleman past medical history CAD, CHF, asthma/COPD no prior pulmonary function tests August 2028, echocardiogram preserved ejection fraction, severe basal septal hypertrophy normal right ventricular function.  History of small bowel perforation requiring exploratory laparotomy and resection in January 2020.  OV 05/07/2019: SOB, wheezing for the past year, wheezing in early morning and sometimes at night. He can walk about 30 yards before he feels like he has to rest. He works as a Banker at [employer], [employer]. Not working currently due to covid risk at work and not wanting be exposed. No allergies. Never hospitalized for respiratory illness. Brother and mother asthma. Mother with lung cancer, she was a non-smoker. Father DMII and heart disease. Associated with coughing and sputum production. Current smoker, 41 years of smoking, 1-2 years stopped, >30 pack year history, highest 1 ppd.   OV 07/19/2019: Patient is in the office today following pulmonary function test.  PFTs completed prior to the office visit which reveals FEV1 3.0 L, 92% predicted, no significant bronchodilator response normal ratio, TLC 87% predicted, ERV 70% predicted, DLCO 78% predicted.  Today in the office otherwise breathing is stable in comparison to where he has been before.  He states that his atrial fibrillation has been having difficulty with changes in his heart rate recently.  Patient has a prior history of asthma.  And currently stable with albuterol use.  He was using Anoro Ellipta as samples did not see much difference in his symptoms.   Past  Medical History:  Diagnosis Date  . Anemia   . Arteriosclerotic cardiovascular disease (ASCVD)    Nonobstructive; cath in 2000: 30-40% mid LAD and proximal RCA;normal EF. stress nuclear in 2006-subtle inferoseptal hypoperfusion with reversibility; negative stress EKG; good exercise tolerance  . Asthma   . Cancer ([place])    skin cancer  . CHF (congestive heart failure) ([place])   . Colon polyps    30 colon polyps found on first colonoscopy  . COPD (chronic obstructive pulmonary disease) ([place])   . Diabetes mellitus without complication ([place])   . Diverticulitis   . DJD (degenerative joint disease)   . GERD (gastroesophageal reflux disease)   . History of kidney stones   . Hyperlipidemia   . Hypertension   . Insomnia   . Obstructive sleep apnea 12/2009   01/26/2010 AHI 83/hr  . Paroxysmal atrial fibrillation ([place]) 10/2004   Onset in 10/2004; recurred 09/2008  . PUD (peptic ulcer disease)    1980s  . RLS (restless legs syndrome)   . Sinusitis      Family History  Problem Relation Age of Onset  . Hypertension Mother   . Breast cancer Mother 6       brain/bone   . Heart attack Father   . Diabetes Brother   . Skin cancer Sister 24  . Brain cancer Maternal Uncle   . Cancer Maternal Uncle        NOS  . Breast cancer Cousin        maternal cousin dx <50  . Cancer Cousin   . Colon cancer Neg Hx  Past Surgical History:  Procedure Laterality Date  . BIOPSY  07/17/2018   Procedure: BIOPSY;  Surgeon: Danie Binder, MD;  [facility];  Service: Endoscopy;;  colon  . BOWEL RESECTION  09/17/2018   SMALL BOWEL RESECTION: 71 CM   . CARDIAC CATHETERIZATION N/A 07/21/2015   Procedure: Left Heart Cath and Coronary Angiography;  Surgeon: Peter M Martinique, MD;  Location: [facility];  Service: Cardiovascular;  Laterality: N/A;  . CARDIAC CATHETERIZATION N/A 07/21/2015   Procedure: Coronary Stent Intervention;  Surgeon: Peter M Martinique, MD;  Location: [facility];   Service: Cardiovascular;  Laterality: N/A;  . CIRCUMCISION N/A 04/05/2019   Procedure: CIRCUMCISION ADULT;  Surgeon: Irine Seal, MD;  Location: [facility];  Service: Urology;  Laterality: N/A;  . COLONOSCOPY N/A 05/19/2014   Dr. Barnie Alderman diverticulosis/moderate external hemorrhoids, >20 simple adenomas. Genetic screening negative.   . COLONOSCOPY WITH PROPOFOL N/A 07/17/2018   Dr. Oneida Alar: Diverticulosis, external/internal hemorrhoids, 32 colon polyps removed.  ten tubular adenomas removed with no high-grade dysplasia.  Advised to have surveillance colonoscopy in 3 years.  . ESOPHAGOGASTRODUODENOSCOPY (EGD) WITH PROPOFOL N/A 07/17/2018   Dr. Oneida Alar: Low-grade narrowing Schatzki ring at the GE junction status post dilation.  Gastritis.  Biopsy with mild nonspecific reactive gastropathy.  No H. pylori.  Marland Kitchen GIVENS CAPSULE STUDY N/A 06/24/2019   Procedure: GIVENS CAPSULE STUDY;  Surgeon: Danie Binder, MD;  [facility];  Service: Endoscopy;  Laterality: N/A;  7:30am  . HERNIA REPAIR  1986   Left inguinal  . INTRAVASCULAR PRESSURE WIRE/FFR STUDY Left 06/08/2017   Procedure: INTRAVASCULAR PRESSURE WIRE/FFR STUDY;  Surgeon: Nelva Bush, MD;  Location: [facility];  Service: Cardiovascular;  Laterality: Left;  LAD and CFX  . LAPAROTOMY N/A 09/17/2018   Procedure: EXPLORATORY LAPAROTOMY;  Surgeon: Virl Cagey, MD;  Location: [facility];  Service: General;  Laterality: N/A;  . LEFT HEART CATH AND CORONARY ANGIOGRAPHY N/A 06/08/2017   Procedure: LEFT HEART CATH AND CORONARY ANGIOGRAPHY;  Surgeon: Nelva Bush, MD;  Location: [facility];  Service: Cardiovascular;  Laterality: N/A;  . POLYPECTOMY  07/17/2018   Procedure: POLYPECTOMY;  Surgeon: Danie Binder, MD;  [facility];  Service: Endoscopy;;  colon  . ROTATOR CUFF REPAIR     Right  . SAVORY DILATION N/A 07/17/2018   Procedure: SAVORY DILATION;  Surgeon: Danie Binder, MD;  [facility];   Service: Endoscopy;  Laterality: N/A;    Social History   Socioeconomic History  . Marital status: Married    Spouse name: Not on file  . Number of children: 5  . Years of education: Not on file  . Highest education level: Not on file  Occupational History  . Occupation: employed    Fish farm manager: [employer]    Comment: full-time  Social Needs  . Financial resource strain: Not on file  . Food insecurity    Worry: Not on file    Inability: Not on file  . Transportation needs    Medical: Not on file    Non-medical: Not on file  Tobacco Use  . Smoking status: Current Every Day Smoker    Packs/day: 1.00    Years: 39.00    Pack years: 39.00    Types: Cigarettes    Start date: 07/24/1970  . Smokeless tobacco: Never Used  Substance and Sexual Activity  . Alcohol use: No    Alcohol/week: 0.0 standard drinks  .  Drug use: No  . Sexual activity: Yes    Partners: Female  Lifestyle  . Physical activity    Days per week: Not on file    Minutes per session: Not on file  . Stress: Not on file  Relationships  . Social Herbalist on phone: Not on file    Gets together: Not on file    Attends religious service: Not on file    Active member of club or organization: Not on file    Attends meetings of clubs or organizations: Not on file    Relationship status: Not on file  . Intimate partner violence    Fear of current or ex partner: Not on file    Emotionally abused: Not on file    Physically abused: Not on file    Forced sexual activity: Not on file  Other Topics Concern  . Not on file  Social History Narrative  . Not on file     No Known Allergies   Outpatient Medications Prior to Visit  Medication Sig Dispense Refill  . atorvastatin (LIPITOR) 80 MG tablet TAKE 1 TABLET(80 MG) BY MOUTH EVERY EVENING 90 tablet 1  . buPROPion (WELLBUTRIN XL) 150 MG 24 hr tablet Take 3 tablets once daily 270 tablet 1  . docusate sodium (COLACE) 100 MG capsule Take 100 mg by mouth as  needed for mild constipation.    Marland Kitchen ELIQUIS 5 MG TABS tablet TAKE 1 TABLET(5 MG) BY MOUTH TWICE DAILY 60 tablet 6  . escitalopram (LEXAPRO) 20 MG tablet Take 1 tablet (20 mg total) by mouth daily. TAKE 1 TABLET(20 MG) BY MOUTH DAILY 90 tablet 1  . furosemide (LASIX) 20 MG tablet TAKE 2 TABLETS(40 MG) BY MOUTH DAILY 90 tablet 1  . HYDROcodone-acetaminophen (NORCO/VICODIN) 5-325 MG tablet Take one tid prn pain 90 tablet 0  . HYDROcodone-acetaminophen (NORCO/VICODIN) 5-325 MG tablet Take one tablet tid prn pain 90 tablet 0  . HYDROcodone-acetaminophen (NORCO/VICODIN) 5-325 MG tablet Take one tablet tid prn pain 90 tablet 0  . lisinopril (ZESTRIL) 2.5 MG tablet Take 1 tablet (2.5 mg total) by mouth daily. 90 tablet 3  . meclizine (ANTIVERT) 25 MG tablet Take 1 tablet (25 mg total) by mouth 3 (three) times daily as needed for dizziness. 30 tablet 0  . metFORMIN (GLUCOPHAGE) 500 MG tablet TAKE 1 TABLET(500 MG) BY MOUTH TWICE DAILY WITH A MEAL 180 tablet 1  . metoprolol tartrate (LOPRESSOR) 100 MG tablet Take 1 tablet (100 mg total) by mouth 2 (two) times daily. 180 tablet 3  . nitroGLYCERIN (NITROSTAT) 0.4 MG SL tablet Place 1 tablet (0.4 mg total) under the tongue every 5 (five) minutes as needed. 25 tablet 3  . omeprazole (PRILOSEC) 40 MG capsule Take 1 capsule (40 mg total) by mouth daily. 90 capsule 1  . ondansetron (ZOFRAN-ODT) 4 MG disintegrating tablet Take 1 tablet (4 mg total) by mouth every 6 (six) hours as needed for nausea. 20 tablet 0  . potassium chloride SA (K-DUR) 20 MEQ tablet Take 1 tablet (20 mEq total) by mouth daily. 90 tablet 2  . rOPINIRole (REQUIP) 3 MG tablet TAKE 1 TABLET(3 MG) BY MOUTH AT BEDTIME 270 tablet 1  . sildenafil (VIAGRA) 100 MG tablet Take 1 tablet 30 minutes before activity 10 tablet 3  . sodium chloride (OCEAN) 0.65 % SOLN nasal spray Place 1 spray into both nostrils daily as needed for congestion.     . sucralfate (CARAFATE) 1 g tablet TAKE  1 TABLET BY MOUTH THREE  TIMES DAILY BEFORE MEALS 42 tablet 1  . tetrahydrozoline (VISINE) 0.05 % ophthalmic solution Place 2 drops into both eyes daily as needed (for dry eyes).    . traZODone (DESYREL) 50 MG tablet TAKE 2 TABLETS(100 MG) BY MOUTH AT BEDTIME 60 tablet 2  . triamcinolone cream (KENALOG) 0.1 % Apply 1 application topically 2 (two) times daily. 60 g 1  . umeclidinium-vilanterol (ANORO ELLIPTA) 62.5-25 MCG/INH AEPB Inhale 1 puff into the lungs daily. 90 each 3  . VENTOLIN HFA 108 (90 Base) MCG/ACT inhaler INHALE 2 PUFFS INTO THE LUNGS EVERY 6 HOURS AS NEEDED FOR WHEEZING OR SHORTNESS OF BREATH 18 g 0   No facility-administered medications prior to visit.     Review of Systems  Constitutional: Negative for chills, fever, malaise/fatigue and weight loss.  HENT: Negative for hearing loss, sore throat and tinnitus.   Eyes: Negative for blurred vision and double vision.  Respiratory: Positive for shortness of breath. Negative for cough, hemoptysis, sputum production, wheezing and stridor.   Cardiovascular: Negative for chest pain, palpitations, orthopnea, leg swelling and PND.  Gastrointestinal: Negative for abdominal pain, constipation, diarrhea, heartburn, nausea and vomiting.  Genitourinary: Negative for dysuria, hematuria and urgency.  Musculoskeletal: Negative for joint pain and myalgias.  Skin: Negative for itching and rash.  Neurological: Negative for dizziness, tingling, weakness and headaches.  Endo/Heme/Allergies: Negative for environmental allergies. Does not bruise/bleed easily.  Psychiatric/Behavioral: Negative for depression. The patient is not nervous/anxious and does not have insomnia.   All other systems reviewed and are negative.    Objective:  Physical Exam Vitals signs reviewed.  Constitutional:      General: He is not in acute distress.    Appearance: He is well-developed. He is obese.  HENT:     Head: Normocephalic and atraumatic.  Eyes:     General: No scleral icterus.     Conjunctiva/sclera: Conjunctivae normal.     Pupils: Pupils are equal, round, and reactive to light.  Neck:     Musculoskeletal: Neck supple.     Vascular: No JVD.     Trachea: No tracheal deviation.  Cardiovascular:     Rate and Rhythm: Normal rate. Rhythm irregular.     Heart sounds: Normal heart sounds. No murmur.  Pulmonary:     Effort: Pulmonary effort is normal. No tachypnea, accessory muscle usage or respiratory distress.     Breath sounds: Normal breath sounds. No stridor. No wheezing, rhonchi or rales.  Abdominal:     General: Bowel sounds are normal. There is no distension.     Palpations: Abdomen is soft.     Tenderness: There is no abdominal tenderness.  Musculoskeletal:        General: No tenderness.  Lymphadenopathy:     Cervical: No cervical adenopathy.  Skin:    General: Skin is warm and dry.     Capillary Refill: Capillary refill takes less than 2 seconds.     Findings: No rash.  Neurological:     Mental Status: He is alert and oriented to person, place, and time.  Psychiatric:        Behavior: Behavior normal.      Vitals:   07/19/19 1454  BP: 118/78  Pulse: 100  Temp: (!) 97.2 F (36.2 C)  TempSrc: Temporal  SpO2: 98%  Weight: 228 lb (103.4 kg)  Height: 5\' 8"  (1.727 m)   98% on RA BMI Readings from Last 3 Encounters:  07/19/19 34.67 kg/m  06/24/19 34.30 kg/m  06/18/19 34.97 kg/m   Wt Readings from Last 3 Encounters:  07/19/19 228 lb (103.4 kg)  06/24/19 225 lb 9.6 oz (102.3 kg)  06/18/19 230 lb (104.3 kg)     CBC    Component Value Date/Time   WBC 7.3 05/21/2019 0718   RBC 4.79 05/21/2019 0718   HGB 12.0 (L) 05/21/2019 0718   HGB 10.9 (L) 12/14/2018 0832   HCT 37.2 (L) 05/21/2019 0718   HCT 35.0 (L) 12/14/2018 0832   PLT 171 05/21/2019 0718   PLT 186 12/14/2018 0832   MCV 77.7 (L) 05/21/2019 0718   MCV 75 (L) 12/14/2018 0832   MCH 25.1 (L) 05/21/2019 0718   MCHC 32.3 05/21/2019 0718   RDW 16.2 (H) 05/21/2019 0718   RDW 17.2  (H) 12/14/2018 0832   LYMPHSABS 1,497 05/21/2019 0718   LYMPHSABS 2.3 07/06/2018 1127   MONOABS 0.7 09/20/2018 0443   EOSABS 73 05/21/2019 0718   EOSABS 0.1 07/06/2018 1127   BASOSABS 29 05/21/2019 0718   BASOSABS 0.1 07/06/2018 1127      Chest Imaging: Chest x-ray 09/21/2018: Vascular congestion, taken during hospitalization. The patient's images have been independently reviewed by me.    Pulmonary Functions Testing Results: PFT Results Latest Ref Rng & Units 07/19/2019  FVC-Pre L 3.93  FVC-Predicted Pre % 89  FVC-Post L 4.00  FVC-Predicted Post % 91  Pre FEV1/FVC % % 71  Post FEV1/FCV % % 76  FEV1-Pre L 2.80  FEV1-Predicted Pre % 85  FEV1-Post L 3.05  DLCO UNC% % 78  DLCO COR %Predicted % 86  TLC L 5.76  TLC % Predicted % 87  RV % Predicted % 80    FeNO: None   Pathology: None   Echocardiogram:  August 2020: Preserved ejection fraction, severe basal hypertrophy, normal right ventricular function.  Heart Catheterization:   October 2018: Dr. Saunders Revel  Dist Cx lesion, 30 %stenosed. Conclusions: 1. Mild to moderate non-obstructive coronary artery disease, as detailed below. LAD and LCx disease is not hemodynamically significant by FFR. 2. Patent stent in the distal LCx. 3. Normal left ventricular contraction and filling pressure. 4. Severe pain at the end of diagnostic catheterization without clear etiology. EKG's were unchanged from baseline, and pain resolved after the patient was able to sit up. Recommendations: 1. Medical management and secondary prevention. 2. Consider evaluation for non-cardiac causes of chest pain.    Assessment & Plan:     ICD-10-CM   1. DOE (dyspnea on exertion)  R06.00   2. SOB (shortness of breath)  R06.02   3. Pulmonary emphysema, unspecified emphysema type ([place])  J43.9   4. On continuous oral anticoagulation  Z79.01   5. Chronic atrial fibrillation (HCC)  I48.20   6. OSA on CPAP  G47.33    Z99.89     Discussion:  63 year old  gentleman still with ongoing dyspnea on exertion.  Albuterol improves his wheeze.  Has a history of asthma from a long time ago.  Pulmonary function test with no COPD.  Normal FEV1.  Recent lung cancer screening CT lung RADS 2.  With a planned 1 year follow-up.  Plan: I do not believe patient needs to be on any long maintenance inhalers. She he can continue use of albuterol as needed for shortness of breath and wheezing. Patient can follow-up with cardiology and primary care regarding atrial fibrillation management. Encouraged weight loss and diet modifications. Encouraged him to attempt to increase his exercise threshold as much as he  could include walking. Patient was informed that albuterol may in fact increase the risk of more tachycardia related events with his atrial fibrillation. He has obstructive sleep apnea and is a CPAP machine that is greater than 48 years old.  He would like to have a new machine. Therefore I will refer to one of our sleep physicians here in the office to get established for a new machine and possible sleep study.  Patient return in approximately 1 year following his low-dose lung cancer screening CT.  Greater than 50% of this patient's 15-minute office visit was been face-to-face discussing above recommendations and treatment plan.     Current Outpatient Medications:  .  albuterol (VENTOLIN HFA) 108 (90 Base) MCG/ACT inhaler, INHALE 2 PUFFS INTO THE LUNGS EVERY 6 HOURS AS NEEDED FOR WHEEZING OR SHORTNESS OF BREATH, Disp: 18 g, Rfl: 11 .  atorvastatin (LIPITOR) 80 MG tablet, TAKE 1 TABLET(80 MG) BY MOUTH EVERY EVENING, Disp: 90 tablet, Rfl: 1 .  buPROPion (WELLBUTRIN XL) 150 MG 24 hr tablet, Take 3 tablets once daily, Disp: 270 tablet, Rfl: 1 .  docusate sodium (COLACE) 100 MG capsule, Take 100 mg by mouth as needed for mild constipation., Disp: , Rfl:  .  ELIQUIS 5 MG TABS tablet, TAKE 1 TABLET(5 MG) BY MOUTH TWICE DAILY, Disp: 60 tablet, Rfl: 6 .  escitalopram  (LEXAPRO) 20 MG tablet, Take 1 tablet (20 mg total) by mouth daily. TAKE 1 TABLET(20 MG) BY MOUTH DAILY, Disp: 90 tablet, Rfl: 1 .  furosemide (LASIX) 20 MG tablet, TAKE 2 TABLETS(40 MG) BY MOUTH DAILY, Disp: 90 tablet, Rfl: 1 .  HYDROcodone-acetaminophen (NORCO/VICODIN) 5-325 MG tablet, Take one tid prn pain, Disp: 90 tablet, Rfl: 0 .  HYDROcodone-acetaminophen (NORCO/VICODIN) 5-325 MG tablet, Take one tablet tid prn pain, Disp: 90 tablet, Rfl: 0 .  HYDROcodone-acetaminophen (NORCO/VICODIN) 5-325 MG tablet, Take one tablet tid prn pain, Disp: 90 tablet, Rfl: 0 .  lisinopril (ZESTRIL) 2.5 MG tablet, Take 1 tablet (2.5 mg total) by mouth daily., Disp: 90 tablet, Rfl: 3 .  meclizine (ANTIVERT) 25 MG tablet, Take 1 tablet (25 mg total) by mouth 3 (three) times daily as needed for dizziness., Disp: 30 tablet, Rfl: 0 .  metFORMIN (GLUCOPHAGE) 500 MG tablet, TAKE 1 TABLET(500 MG) BY MOUTH TWICE DAILY WITH A MEAL, Disp: 180 tablet, Rfl: 1 .  metoprolol tartrate (LOPRESSOR) 100 MG tablet, Take 1 tablet (100 mg total) by mouth 2 (two) times daily., Disp: 180 tablet, Rfl: 3 .  nitroGLYCERIN (NITROSTAT) 0.4 MG SL tablet, Place 1 tablet (0.4 mg total) under the tongue every 5 (five) minutes as needed., Disp: 25 tablet, Rfl: 3 .  omeprazole (PRILOSEC) 40 MG capsule, Take 1 capsule (40 mg total) by mouth daily., Disp: 90 capsule, Rfl: 1 .  ondansetron (ZOFRAN-ODT) 4 MG disintegrating tablet, Take 1 tablet (4 mg total) by mouth every 6 (six) hours as needed for nausea., Disp: 20 tablet, Rfl: 0 .  potassium chloride SA (K-DUR) 20 MEQ tablet, Take 1 tablet (20 mEq total) by mouth daily., Disp: 90 tablet, Rfl: 2 .  rOPINIRole (REQUIP) 3 MG tablet, TAKE 1 TABLET(3 MG) BY MOUTH AT BEDTIME, Disp: 270 tablet, Rfl: 1 .  sildenafil (VIAGRA) 100 MG tablet, Take 1 tablet 30 minutes before activity, Disp: 10 tablet, Rfl: 3 .  sodium chloride (OCEAN) 0.65 % SOLN nasal spray, Place 1 spray into both nostrils daily as needed for  congestion. , Disp: , Rfl:  .  sucralfate (CARAFATE) 1 g  tablet, TAKE 1 TABLET BY MOUTH THREE TIMES DAILY BEFORE MEALS, Disp: 42 tablet, Rfl: 1 .  tetrahydrozoline (VISINE) 0.05 % ophthalmic solution, Place 2 drops into both eyes daily as needed (for dry eyes)., Disp: , Rfl:  .  traZODone (DESYREL) 50 MG tablet, TAKE 2 TABLETS(100 MG) BY MOUTH AT BEDTIME, Disp: 60 tablet, Rfl: 2 .  triamcinolone cream (KENALOG) 0.1 %, Apply 1 application topically 2 (two) times daily., Disp: 60 g, Rfl: 1   Garner Nash, DO [facility] 07/19/2019 3:11 PM

## 2019-07-23 ENCOUNTER — Other Ambulatory Visit: Payer: Self-pay

## 2019-07-23 ENCOUNTER — Ambulatory Visit (INDEPENDENT_AMBULATORY_CARE_PROVIDER_SITE_OTHER): Payer: 59 | Admitting: Pulmonary Disease

## 2019-07-23 ENCOUNTER — Encounter: Payer: Self-pay | Admitting: Pulmonary Disease

## 2019-07-23 VITALS — BP 122/70 | HR 85 | Temp 97.3°F | Ht 68.0 in | Wt 227.6 lb

## 2019-07-23 DIAGNOSIS — G4733 Obstructive sleep apnea (adult) (pediatric): Secondary | ICD-10-CM

## 2019-07-23 NOTE — Progress Notes (Signed)
home

## 2019-07-23 NOTE — Patient Instructions (Signed)
History of obstructive sleep apnea  Obtain a home sleep study Set you up with a medical supply company to supply a new machine  We will see you back in the office in 3 months Sleep Apnea Sleep apnea is a condition in which breathing pauses or becomes shallow during sleep. Episodes of sleep apnea usually last 10 seconds or longer, and they may occur as many as 20 times an hour. Sleep apnea disrupts your sleep and keeps your body from getting the rest that it needs. This condition can increase your risk of certain health problems, including:  Heart attack.  Stroke.  Obesity.  Diabetes.  Heart failure.  Irregular heartbeat. What are the causes? There are three kinds of sleep apnea:  Obstructive sleep apnea. This kind is caused by a blocked or collapsed airway.  Central sleep apnea. This kind happens when the part of the brain that controls breathing does not send the correct signals to the muscles that control breathing.  Mixed sleep apnea. This is a combination of obstructive and central sleep apnea. The most common cause of this condition is a collapsed or blocked airway. An airway can collapse or become blocked if:  Your throat muscles are abnormally relaxed.  Your tongue and tonsils are larger than normal.  You are overweight.  Your airway is smaller than normal. What increases the risk? You are more likely to develop this condition if you:  Are overweight.  Smoke.  Have a smaller than normal airway.  Are elderly.  Are male.  Drink alcohol.  Take sedatives or tranquilizers.  Have a family history of sleep apnea. What are the signs or symptoms? Symptoms of this condition include:  Trouble staying asleep.  Daytime sleepiness and tiredness.  Irritability.  Loud snoring.  Morning headaches.  Trouble concentrating.  Forgetfulness.  Decreased interest in sex.  Unexplained sleepiness.  Mood swings.  Personality changes.  Feelings of  depression.  Waking up often during the night to urinate.  Dry mouth.  Sore throat. How is this diagnosed? This condition may be diagnosed with:  A medical history.  A physical exam.  A series of tests that are done while you are sleeping (sleep study). These tests are usually done in a sleep lab, but they may also be done at home. How is this treated? Treatment for this condition aims to restore normal breathing and to ease symptoms during sleep. It may involve managing health issues that can affect breathing, such as high blood pressure or obesity. Treatment may include:  Sleeping on your side.  Using a decongestant if you have nasal congestion.  Avoiding the use of depressants, including alcohol, sedatives, and narcotics.  Losing weight if you are overweight.  Making changes to your diet.  Quitting smoking.  Using a device to open your airway while you sleep, such as: ? An oral appliance. This is a custom-made mouthpiece that shifts your lower jaw forward. ? A continuous positive airway pressure (CPAP) device. This device blows air through a mask when you breathe out (exhale). ? A nasal expiratory positive airway pressure (EPAP) device. This device has valves that you put into each nostril. ? A bi-level positive airway pressure (BPAP) device. This device blows air through a mask when you breathe in (inhale) and breathe out (exhale).  Having surgery if other treatments do not work. During surgery, excess tissue is removed to create a wider airway. It is important to get treatment for sleep apnea. Without treatment, this condition can lead  to:  High blood pressure.  Coronary artery disease.  In men, an inability to achieve or maintain an erection (impotence).  Reduced thinking abilities. Follow these instructions at home: Lifestyle  Make any lifestyle changes that your health care provider recommends.  Eat a healthy, well-balanced diet.  Take steps to lose weight  if you are overweight.  Avoid using depressants, including alcohol, sedatives, and narcotics.  Do not use any products that contain nicotine or tobacco, such as cigarettes, e-cigarettes, and chewing tobacco. If you need help quitting, ask your health care provider. General instructions  Take over-the-counter and prescription medicines only as told by your health care provider.  If you were given a device to open your airway while you sleep, use it only as told by your health care provider.  If you are having surgery, make sure to tell your health care provider you have sleep apnea. You may need to bring your device with you.  Keep all follow-up visits as told by your health care provider. This is important. Contact a health care provider if:  The device that you received to open your airway during sleep is uncomfortable or does not seem to be working.  Your symptoms do not improve.  Your symptoms get worse. Get help right away if:  You develop: ? Chest pain. ? Shortness of breath. ? Discomfort in your back, arms, or stomach.  You have: ? Trouble speaking. ? Weakness on one side of your body. ? Drooping in your face. These symptoms may represent a serious problem that is an emergency. Do not wait to see if the symptoms will go away. Get medical help right away. Call your local emergency services (911 in the [place] Do not drive yourself to the hospital. Summary  Sleep apnea is a condition in which breathing pauses or becomes shallow during sleep.  The most common cause is a collapsed or blocked airway.  The goal of treatment is to restore normal breathing and to ease symptoms during sleep. This information is not intended to replace advice given to you by your health care provider. Make sure you discuss any questions you have with your health care provider. Document Released: 08/05/2002 Document Revised: 06/01/2018 Document Reviewed: 04/10/2018 Elsevier Patient Education   2020 [employer].

## 2019-07-23 NOTE — Progress Notes (Signed)
Shaun [name]    [id number]    1956-02-19  Primary Care Physician:Luking, Grace Bushy, MD  Referring Physician: Mikey Kirschner, [address] [place],  [place] [address]  Chief complaint:   Patient with a history of obstructive sleep apnea  HPI:  Previous sleep study at [facility] over 7 years ago has been on CPAP since Machine is becoming dated and does not feel he is benefiting as he used to  Usually goes to bed about 10 to 10:30 PM Falls asleep in about 15 minutes 2-3 awakenings at night Final awakening time about 5:45 AM  Denies any dryness of his mouth in the mornings No headaches in the mornings Memory is fair  Father had obstructive sleep apnea  Benefited significantly from using CPAP in the past and still benefits  Multiple comorbidities including hypertension, angina, heart failure, emphysema  Outpatient Encounter Medications as of 07/23/2019  Medication Sig  . albuterol (VENTOLIN HFA) 108 (90 Base) MCG/ACT inhaler INHALE 2 PUFFS INTO THE LUNGS EVERY 6 HOURS AS NEEDED FOR WHEEZING OR SHORTNESS OF BREATH  . atorvastatin (LIPITOR) 80 MG tablet TAKE 1 TABLET(80 MG) BY MOUTH EVERY EVENING  . buPROPion (WELLBUTRIN XL) 150 MG 24 hr tablet Take 3 tablets once daily  . docusate sodium (COLACE) 100 MG capsule Take 100 mg by mouth as needed for mild constipation.  Marland Kitchen ELIQUIS 5 MG TABS tablet TAKE 1 TABLET(5 MG) BY MOUTH TWICE DAILY  . escitalopram (LEXAPRO) 20 MG tablet Take 1 tablet (20 mg total) by mouth daily. TAKE 1 TABLET(20 MG) BY MOUTH DAILY  . furosemide (LASIX) 20 MG tablet TAKE 2 TABLETS(40 MG) BY MOUTH DAILY  . HYDROcodone-acetaminophen (NORCO/VICODIN) 5-325 MG tablet Take one tid prn pain  . HYDROcodone-acetaminophen (NORCO/VICODIN) 5-325 MG tablet Take one tablet tid prn pain  . HYDROcodone-acetaminophen (NORCO/VICODIN) 5-325 MG tablet Take one tablet tid prn pain  . lisinopril (ZESTRIL) 2.5 MG tablet Take 1 tablet (2.5 mg total) by  mouth daily.  . meclizine (ANTIVERT) 25 MG tablet Take 1 tablet (25 mg total) by mouth 3 (three) times daily as needed for dizziness.  . metFORMIN (GLUCOPHAGE) 500 MG tablet TAKE 1 TABLET(500 MG) BY MOUTH TWICE DAILY WITH A MEAL  . metoprolol tartrate (LOPRESSOR) 100 MG tablet Take 1 tablet (100 mg total) by mouth 2 (two) times daily.  . nitroGLYCERIN (NITROSTAT) 0.4 MG SL tablet Place 1 tablet (0.4 mg total) under the tongue every 5 (five) minutes as needed.  Marland Kitchen omeprazole (PRILOSEC) 40 MG capsule Take 1 capsule (40 mg total) by mouth daily.  . ondansetron (ZOFRAN-ODT) 4 MG disintegrating tablet Take 1 tablet (4 mg total) by mouth every 6 (six) hours as needed for nausea.  . potassium chloride SA (K-DUR) 20 MEQ tablet Take 1 tablet (20 mEq total) by mouth daily.  Marland Kitchen rOPINIRole (REQUIP) 3 MG tablet TAKE 1 TABLET(3 MG) BY MOUTH AT BEDTIME  . sildenafil (VIAGRA) 100 MG tablet Take 1 tablet 30 minutes before activity  . sodium chloride (OCEAN) 0.65 % SOLN nasal spray Place 1 spray into both nostrils daily as needed for congestion.   . sucralfate (CARAFATE) 1 g tablet TAKE 1 TABLET BY MOUTH THREE TIMES DAILY BEFORE MEALS  . tetrahydrozoline (VISINE) 0.05 % ophthalmic solution Place 2 drops into both eyes daily as needed (for dry eyes).  . traZODone (DESYREL) 50 MG tablet TAKE 2 TABLETS(100 MG) BY MOUTH AT BEDTIME  . triamcinolone cream (KENALOG)  0.1 % Apply 1 application topically 2 (two) times daily.   No facility-administered encounter medications on file as of 07/23/2019.     Allergies as of 07/23/2019  . (No Known Allergies)    Past Medical History:  Diagnosis Date  . Anemia   . Arteriosclerotic cardiovascular disease (ASCVD)    Nonobstructive; cath in 2000: 30-40% mid LAD and proximal RCA;normal EF. stress nuclear in 2006-subtle inferoseptal hypoperfusion with reversibility; negative stress EKG; good exercise tolerance  . Asthma   . Cancer ([place])    skin cancer  . CHF (congestive heart  failure) ([place])   . Colon polyps    30 colon polyps found on first colonoscopy  . COPD (chronic obstructive pulmonary disease) ([place])   . Diabetes mellitus without complication ([place])   . Diverticulitis   . DJD (degenerative joint disease)   . GERD (gastroesophageal reflux disease)   . History of kidney stones   . Hyperlipidemia   . Hypertension   . Insomnia   . Obstructive sleep apnea 12/2009   01/26/2010 AHI 83/hr  . Paroxysmal atrial fibrillation ([place]) 10/2004   Onset in 10/2004; recurred 09/2008  . PUD (peptic ulcer disease)    1980s  . RLS (restless legs syndrome)   . Sinusitis     Past Surgical History:  Procedure Laterality Date  . BIOPSY  07/17/2018   Procedure: BIOPSY;  Surgeon: Danie Binder, MD;  [facility];  Service: Endoscopy;;  colon  . BOWEL RESECTION  09/17/2018   SMALL BOWEL RESECTION: 71 CM   . CARDIAC CATHETERIZATION N/A 07/21/2015   Procedure: Left Heart Cath and Coronary Angiography;  Surgeon: Peter M Martinique, MD;  Location: [facility];  Service: Cardiovascular;  Laterality: N/A;  . CARDIAC CATHETERIZATION N/A 07/21/2015   Procedure: Coronary Stent Intervention;  Surgeon: Peter M Martinique, MD;  Location: [facility];  Service: Cardiovascular;  Laterality: N/A;  . CIRCUMCISION N/A 04/05/2019   Procedure: CIRCUMCISION ADULT;  Surgeon: Irine Seal, MD;  Location: [facility];  Service: Urology;  Laterality: N/A;  . COLONOSCOPY N/A 05/19/2014   Dr. Barnie Alderman diverticulosis/moderate external hemorrhoids, >20 simple adenomas. Genetic screening negative.   . COLONOSCOPY WITH PROPOFOL N/A 07/17/2018   Dr. Oneida Alar: Diverticulosis, external/internal hemorrhoids, 32 colon polyps removed.  ten tubular adenomas removed with no high-grade dysplasia.  Advised to have surveillance colonoscopy in 3 years.  . ESOPHAGOGASTRODUODENOSCOPY (EGD) WITH PROPOFOL N/A 07/17/2018   Dr. Oneida Alar: Low-grade narrowing Schatzki ring at the GE junction status post dilation.   Gastritis.  Biopsy with mild nonspecific reactive gastropathy.  No H. pylori.  Marland Kitchen GIVENS CAPSULE STUDY N/A 06/24/2019   Procedure: GIVENS CAPSULE STUDY;  Surgeon: Danie Binder, MD;  [facility];  Service: Endoscopy;  Laterality: N/A;  7:30am  . HERNIA REPAIR  1986   Left inguinal  . INTRAVASCULAR PRESSURE WIRE/FFR STUDY Left 06/08/2017   Procedure: INTRAVASCULAR PRESSURE WIRE/FFR STUDY;  Surgeon: Nelva Bush, MD;  Location: [facility];  Service: Cardiovascular;  Laterality: Left;  LAD and CFX  . LAPAROTOMY N/A 09/17/2018   Procedure: EXPLORATORY LAPAROTOMY;  Surgeon: Virl Cagey, MD;  Location: [facility];  Service: General;  Laterality: N/A;  . LEFT HEART CATH AND CORONARY ANGIOGRAPHY N/A 06/08/2017   Procedure: LEFT HEART CATH AND CORONARY ANGIOGRAPHY;  Surgeon: Nelva Bush, MD;  Location: [facility];  Service: Cardiovascular;  Laterality: N/A;  . POLYPECTOMY  07/17/2018   Procedure: POLYPECTOMY;  Surgeon: Danie Binder,  MD;  [facility];  Service: Endoscopy;;  colon  . ROTATOR CUFF REPAIR     Right  . SAVORY DILATION N/A 07/17/2018   Procedure: SAVORY DILATION;  Surgeon: Danie Binder, MD;  [facility];  Service: Endoscopy;  Laterality: N/A;    Family History  Problem Relation Age of Onset  . Hypertension Mother   . Breast cancer Mother 21       brain/bone   . Heart attack Father   . Diabetes Brother   . Skin cancer Sister 71  . Brain cancer Maternal Uncle   . Cancer Maternal Uncle        NOS  . Breast cancer Cousin        maternal cousin dx <50  . Cancer Cousin   . Colon cancer Neg Hx     Social History   Socioeconomic History  . Marital status: Married    Spouse name: Not on file  . Number of children: 5  . Years of education: Not on file  . Highest education level: Not on file  Occupational History  . Occupation: employed    Fish farm manager: [employer]    Comment: full-time  Social Needs  . Financial  resource strain: Not on file  . Food insecurity    Worry: Not on file    Inability: Not on file  . Transportation needs    Medical: Not on file    Non-medical: Not on file  Tobacco Use  . Smoking status: Current Every Day Smoker    Packs/day: 1.00    Years: 39.00    Pack years: 39.00    Types: Cigarettes    Start date: 07/24/1970  . Smokeless tobacco: Never Used  Substance and Sexual Activity  . Alcohol use: No    Alcohol/week: 0.0 standard drinks  . Drug use: No  . Sexual activity: Yes    Partners: Female  Lifestyle  . Physical activity    Days per week: Not on file    Minutes per session: Not on file  . Stress: Not on file  Relationships  . Social Herbalist on phone: Not on file    Gets together: Not on file    Attends religious service: Not on file    Active member of club or organization: Not on file    Attends meetings of clubs or organizations: Not on file    Relationship status: Not on file  . Intimate partner violence    Fear of current or ex partner: Not on file    Emotionally abused: Not on file    Physically abused: Not on file    Forced sexual activity: Not on file  Other Topics Concern  . Not on file  Social History Narrative  . Not on file    Review of Systems  Constitutional: Negative.   HENT: Negative.   Respiratory: Positive for apnea.   Cardiovascular: Negative.   Psychiatric/Behavioral: Positive for sleep disturbance.  All other systems reviewed and are negative.   Vitals:   07/23/19 1501  BP: 122/70  Pulse: 85  Temp: (!) 97.3 F (36.3 C)  SpO2: 95%     Physical Exam  Constitutional: He appears well-developed and well-nourished.  HENT:  Head: Normocephalic and atraumatic.  Eyes: Pupils are equal, round, and reactive to light. Right eye exhibits no discharge. Left eye exhibits no discharge.  Neck: Normal range of motion. Neck supple. No tracheal deviation present. No thyromegaly present.  Cardiovascular: Normal rate  and regular rhythm.  Pulmonary/Chest: No respiratory distress. He has no wheezes. He has no rales.  Abdominal: Soft. Bowel sounds are normal. He exhibits no distension. There is no abdominal tenderness.     Results of the Epworth flowsheet 07/23/2019  Sitting and reading 2  Watching TV 2  Sitting, inactive in a public place (e.g. a theatre or a meeting) 2  As a passenger in a car for an hour without a break 2  Lying down to rest in the afternoon when circumstances permit 3  Sitting and talking to someone 2  Sitting quietly after a lunch without alcohol 2  In a car, while stopped for a few minutes in traffic 1  Total score 16   Assessment:   History of obstructive sleep apnea -Compliant with CPAP -No significant difficulty with CPAP tolerance  Excessive daytime sleepiness -May be related to undertreated sleep disordered breathing  History of emphysema History of hypertension History of heart failure  Pathophysiology of sleep disordered breathing discussed  Treatment options for sleep disordered breathing discussed  Plan/Recommendations:  Schedule patient for home sleep study  We will set the patient up with CPAP therapy once study becomes available  Importance of weight loss and regular exercise discussed  I will see the patient back in the office in about 3 months  Encouraged to call with any significant concerns  Sherrilyn Rist MD [facility] 07/23/2019, 3:17 PM  CC: Mikey Kirschner, MD

## 2019-08-03 ENCOUNTER — Other Ambulatory Visit: Payer: Self-pay | Admitting: Family Medicine

## 2019-08-04 NOTE — Progress Notes (Signed)
REVIEWED-NO ADDITIONAL RECOMMENDATIONS. 

## 2019-08-12 ENCOUNTER — Other Ambulatory Visit: Payer: Self-pay

## 2019-08-12 DIAGNOSIS — D649 Anemia, unspecified: Secondary | ICD-10-CM

## 2019-08-21 ENCOUNTER — Telehealth: Payer: Self-pay | Admitting: Pulmonary Disease

## 2019-08-21 NOTE — Telephone Encounter (Signed)
Pt's order for hst was placed on 11/24.  I called him and told him pt's we are scheduling now had orders placed 2nd week in Nov and we will try to get to him as quick as we can but probably won't be before end of year.  He states ok.  Nothing further needed.

## 2019-08-27 ENCOUNTER — Telehealth: Payer: Self-pay | Admitting: Family Medicine

## 2019-08-27 NOTE — Telephone Encounter (Signed)
Let's do 

## 2019-08-27 NOTE — Telephone Encounter (Signed)
Gosh then im not sure whyu pt would even  call, plz explain to pt that it would be best to allo0w yhe process to carry thru for a more accurate rx

## 2019-08-27 NOTE — Telephone Encounter (Signed)
Patient advised and will let Pulmonology handle prescription. Pulmonology is in the process of setting patient up for a new sleep study since it has been 7 years since his last test. The note from pulmonology states they will order new CPAP when results are back. Patient verbalized understanding.

## 2019-08-27 NOTE — Telephone Encounter (Signed)
Script was wrote earlier this year for new cpap and patient was unable to get it at the time. He is now able to but the prescription has expired. He would like an new script sent to [facility], [place]

## 2019-09-01 ENCOUNTER — Other Ambulatory Visit: Payer: Self-pay | Admitting: Family Medicine

## 2019-09-11 ENCOUNTER — Other Ambulatory Visit: Payer: Self-pay | Admitting: Family Medicine

## 2019-09-16 ENCOUNTER — Ambulatory Visit: Payer: 59

## 2019-09-16 ENCOUNTER — Other Ambulatory Visit: Payer: Self-pay

## 2019-09-16 DIAGNOSIS — G4733 Obstructive sleep apnea (adult) (pediatric): Secondary | ICD-10-CM

## 2019-09-18 ENCOUNTER — Other Ambulatory Visit: Payer: Self-pay

## 2019-09-18 DIAGNOSIS — G4733 Obstructive sleep apnea (adult) (pediatric): Secondary | ICD-10-CM

## 2019-09-18 DIAGNOSIS — I5032 Chronic diastolic (congestive) heart failure: Secondary | ICD-10-CM

## 2019-09-18 MED ORDER — FUROSEMIDE 20 MG PO TABS: ORAL_TABLET | ORAL | 1 refills | Status: DC

## 2019-09-20 ENCOUNTER — Telehealth: Payer: Self-pay | Admitting: General Surgery

## 2019-09-20 DIAGNOSIS — G4733 Obstructive sleep apnea (adult) (pediatric): Secondary | ICD-10-CM

## 2019-09-20 NOTE — Telephone Encounter (Signed)
Called the patient to make him aware of the results from his sleep study dated 09/16/19.  Results "recommened CPAP therapy for moderate OSA. Auto titrating CPAP with pressure settings of 5-15.  Patient scheduled for video visit with Rexene Edison, NP 11/04/19 at 10:30 for compliance follow up.  Patient voiced understanding. Order placed with [facility] at the patient's request. Nothing further needed at this time.

## 2019-10-02 ENCOUNTER — Other Ambulatory Visit: Payer: Self-pay | Admitting: Family Medicine

## 2019-10-03 ENCOUNTER — Encounter: Payer: Self-pay | Admitting: Family Medicine

## 2019-10-03 ENCOUNTER — Other Ambulatory Visit: Payer: Self-pay | Admitting: Family Medicine

## 2019-10-04 ENCOUNTER — Telehealth: Payer: Self-pay | Admitting: Family Medicine

## 2019-10-04 MED ORDER — HYDROCODONE-ACETAMINOPHEN 5-325 MG PO TABS: ORAL_TABLET | ORAL | 0 refills | Status: DC

## 2019-10-04 NOTE — Telephone Encounter (Signed)
Ok times one 

## 2019-10-04 NOTE — Telephone Encounter (Signed)
Last seen 07/12/2019. Please advise. Thank you

## 2019-10-04 NOTE — Telephone Encounter (Signed)
Medication pended and pt is aware 

## 2019-10-04 NOTE — Telephone Encounter (Signed)
Pt is requesting refill on HYDROcodone-acetaminophen (NORCO/VICODIN) 5-325 MG tablet. Pt has virtual set up for med check on 10/08/19  [facility], [place] - [address]

## 2019-10-08 ENCOUNTER — Ambulatory Visit (INDEPENDENT_AMBULATORY_CARE_PROVIDER_SITE_OTHER): Payer: 59 | Admitting: Family Medicine

## 2019-10-08 DIAGNOSIS — E119 Type 2 diabetes mellitus without complications: Secondary | ICD-10-CM | POA: Diagnosis not present

## 2019-10-08 DIAGNOSIS — I10 Essential (primary) hypertension: Secondary | ICD-10-CM

## 2019-10-08 DIAGNOSIS — E785 Hyperlipidemia, unspecified: Secondary | ICD-10-CM

## 2019-10-08 DIAGNOSIS — Z79899 Other long term (current) drug therapy: Secondary | ICD-10-CM

## 2019-10-08 MED ORDER — HYDROCODONE-ACETAMINOPHEN 5-325 MG PO TABS: ORAL_TABLET | ORAL | 0 refills | Status: DC

## 2019-10-08 NOTE — Progress Notes (Signed)
   Subjective:    Patient ID: Shaun [name], male    DOB: 22-Jun-1956, 64 y.o.   MRN: [id number]  HPI This patient was seen today for chronic pain Chronic hip n back The medication list was reviewed and updated.   -Compliance with medication: Hydrocodone 5-325  - Number patient states they take daily: 3-4  -when was the last dose patient took? 6 pm last night  The patient was advised the importance of maintaining medication and not using illegal substances with these.  Here for refills and follow up  The patient was educated that we can provide 3 monthly scripts for their medication, it is their responsibility to follow the instructions.  Side effects or complications from medications: none  Patient is aware that pain medications are meant to minimize the severity of the pain to allow their pain levels to improve to allow for better function. They are aware of that pain medications cannot totally remove their pain.  Due for UDT ( at least once per year) :   Virtual Visit via Video Note  I connected with Shaun [name] on 10/08/19 at  8:30 AM EST by a video enabled telemedicine application and verified that I am speaking with the correct person using two identifiers.  Location: Patient: home Provider: office   I discussed the limitations of evaluation and management by telemedicine and the availability of in person appointments. The patient expressed understanding and agreed to proceed.  History of Present Illness:    Observations/Objective:   Assessment and Plan:   Follow Up Instructions:    I discussed the assessment and treatment plan with the patient. The patient was provided an opportunity to ask questions and all were answered. The patient agreed with the plan and demonstrated an understanding of the instructions.   The patient was advised to call back or seek an in-person evaluation if the symptoms worsen or if the condition fails to improve as  anticipated.  I provided67minutes of non-face-to-face time during this encounter.    Patient compliant with pain medication. Continues to experience the pain which led to initiation of analgesic intervention. No significant negative side effects. States definitely needs the pain medication to maintain current level of functioning. Does not receive controlled substance pain medication elsewhere. Patient reports his 3 pain tablets per day to control his pain.  Definitely needs 4/day.      Review of Systems No headache no chest pain no shortness of breath    Objective:   Physical Exam  Virtual      Assessment & Plan:  Impression: Chronic pain. Patient compliant with medication. No substantial side effects. [place] controlled substance registry reviewed to ensure compliance and proper use of medication. Patient aware goal of medicine is not complete resolution of pain but to control his symptoms to improve his functional capacity. Aware of potential adverse side effects Pain medication increased to 120 tablets/month may average one 4 times per day rationale discussed follow-up in 3 months for wellness plus chronic/time for blood work discussed

## 2019-10-10 ENCOUNTER — Telehealth: Payer: Self-pay | Admitting: Family Medicine

## 2019-10-10 NOTE — Telephone Encounter (Signed)
Patient is needing labs done for physical coming up in May.

## 2019-10-10 NOTE — Telephone Encounter (Signed)
Blood work was ordered at office visit 10/08/2019. Left message to return call to notify patient.

## 2019-10-10 NOTE — Telephone Encounter (Signed)
Pt called back, I informed him that the labs were ordered, pt verbalized understanding

## 2019-11-04 ENCOUNTER — Encounter: Payer: Self-pay | Admitting: Adult Health

## 2019-11-04 ENCOUNTER — Telehealth (INDEPENDENT_AMBULATORY_CARE_PROVIDER_SITE_OTHER): Payer: 59 | Admitting: Adult Health

## 2019-11-04 ENCOUNTER — Other Ambulatory Visit: Payer: Self-pay | Admitting: Family Medicine

## 2019-11-04 ENCOUNTER — Telehealth: Payer: Self-pay | Admitting: Adult Health

## 2019-11-04 DIAGNOSIS — G4733 Obstructive sleep apnea (adult) (pediatric): Secondary | ICD-10-CM

## 2019-11-04 DIAGNOSIS — Z9989 Dependence on other enabling machines and devices: Secondary | ICD-10-CM

## 2019-11-04 NOTE — Progress Notes (Signed)
Virtual Visit via Telephone Note  I connected with Shaun [name] on 11/04/19 at 10:30 AM EST by telephone and verified that I am speaking with the correct person using two identifiers.  Location: Patient: Home  Provider: Office    I discussed the limitations, risks, security and privacy concerns of performing an evaluation and management service by telephone and the availability of in person appointments. I also discussed with the patient that there may be a patient responsible charge related to this service. The patient expressed understanding and agreed to proceed.   History of Present Illness: 64 year old male former smoker with known emphysema and obstructive sleep apnea  Today's televisit is a 33-month follow-up for sleep apnea.  Patient established for sleep in November 2020 , he was set up for a home sleep study that showed moderate sleep apnea and started on CPAP.  Patient says he is doing very well on CPAP.  He says he feels rested with no significant daytime sleepiness.  CPAP download shows excellent compliance with 100% usage.  Daily average usage at 9 hours.  Patient is on auto CPAP 5 to 15 cm H2O.  Daily average CPAP pressure 10 cm H2O.  AHI 0.4.   Observations/Objective: Speaks in full sentences.   Assessment and Plan: Moderate obstructive sleep apnea with excellent control and compliance on nocturnal CPAP.  Plan  Patient Instructions  Continue on CPAP, keep up the good work Work on healthy weight Do not drive if sleepy Follow up with Dr. Hermina Staggers in 1 year and As needed       Follow Up Instructions:    I discussed the assessment and treatment plan with the patient. The patient was provided an opportunity to ask questions and all were answered. The patient agreed with the plan and demonstrated an understanding of the instructions.   The patient was advised to call back or seek an in-person evaluation if the symptoms worsen or if the condition fails to improve as  anticipated.  I provided 15  minutes of non-face-to-face time during this encounter.   Rexene Edison, NP

## 2019-11-04 NOTE — Telephone Encounter (Signed)
Seen for med check up 10/08/19

## 2019-11-04 NOTE — Patient Instructions (Signed)
Continue on CPAP, keep up the good work Work on [employer] Do not drive if sleepy Follow up with Dr. Hermina Staggers in 1 year and As needed

## 2019-11-05 NOTE — Telephone Encounter (Signed)
Nothing noted in pts chart. Will sign off on encounter as an error.

## 2019-11-19 ENCOUNTER — Other Ambulatory Visit: Payer: Self-pay | Admitting: Cardiology

## 2019-11-27 ENCOUNTER — Other Ambulatory Visit: Payer: Self-pay

## 2019-11-27 MED ORDER — POTASSIUM CHLORIDE CRYS ER 20 MEQ PO TBCR: 20.0000 meq | EXTENDED_RELEASE_TABLET | Freq: Every day | ORAL | 0 refills | Status: DC

## 2019-11-28 ENCOUNTER — Telehealth: Payer: Self-pay | Admitting: Family Medicine

## 2019-11-28 NOTE — Telephone Encounter (Signed)
Patient is wanting another extension on work note from 07/14/2019 because of Covid. His letter from 2020 is in your folder or will patient need a follow up visit to renew letter. Please advise

## 2019-11-28 NOTE — Telephone Encounter (Signed)
Contacted patient to get dates. Pt will call back with dates

## 2019-11-28 NOTE — Telephone Encounter (Signed)
Pt states that he has a work excuse that ends on 12/10/2019 that he received from [employer]. Please advise. Thank you

## 2019-11-28 NOTE — Telephone Encounter (Signed)
Pt wants extension on work note because he is high risk for covid.

## 2019-12-04 ENCOUNTER — Encounter: Payer: Self-pay | Admitting: Family Medicine

## 2019-12-05 ENCOUNTER — Other Ambulatory Visit: Payer: Self-pay | Admitting: Family Medicine

## 2019-12-10 ENCOUNTER — Other Ambulatory Visit: Payer: Self-pay | Admitting: Family Medicine

## 2019-12-11 NOTE — Progress Notes (Signed)
HPI: FUpermanent atrial fibrillation, coronary artery disease, diabetes mellitus and hypertension. Patient is status post PCI of his circumflex in November 2016. Patient was seen in atrial fibrillation clinic in June 2018 to see if there were options for restoring sinus rhythm. Given long-standing atrial fibrillation and left atrial enlargement it was felt that rate control and anticoagulation indicated.Cardiac catheterization October 2018 showed a 30% distal circumflex. LV function was normal with normal LV filling pressure. Monitor 8/20 showed atrial fibrillation with PVCs or aberrantly conducted beats, rate controlled.  Echo 8/10 showed normal LV function, mild to moderate LAE. Also with chronic diastolic CHF.Since last seen,he has dyspnea on exertion but no orthopnea or PND.  No pedal edema.  Occasional sharp pain in his chest for 1 to 2 seconds but no exertional chest pain.  No syncope or bleeding.  Current Outpatient Medications  Medication Sig Dispense Refill  . albuterol (VENTOLIN HFA) 108 (90 Base) MCG/ACT inhaler INHALE 2 PUFFS INTO THE LUNGS EVERY 6 HOURS AS NEEDED FOR WHEEZING OR SHORTNESS OF BREATH 18 g 11  . atorvastatin (LIPITOR) 80 MG tablet TAKE 1 TABLET(80 MG) BY MOUTH EVERY EVENING 90 tablet 1  . buPROPion (WELLBUTRIN XL) 150 MG 24 hr tablet Take 3 tablets once daily 270 tablet 1  . docusate sodium (COLACE) 100 MG capsule Take 100 mg by mouth as needed for mild constipation.    Marland Kitchen ELIQUIS 5 MG TABS tablet TAKE 1 TABLET(5 MG) BY MOUTH TWICE DAILY 60 tablet 6  . escitalopram (LEXAPRO) 20 MG tablet TAKE 1 TABLET(20 MG) BY MOUTH DAILY 90 tablet 1  . furosemide (LASIX) 20 MG tablet TAKE 2 TABLETS(40 MG) BY MOUTH DAILY 90 tablet 1  . HYDROcodone-acetaminophen (NORCO/VICODIN) 5-325 MG tablet Take one tablet up to qid prn pain 120 tablet 0  . HYDROcodone-acetaminophen (NORCO/VICODIN) 5-325 MG tablet Take one tablet up to qid prn pain 120 tablet 0  . HYDROcodone-acetaminophen  (NORCO/VICODIN) 5-325 MG tablet Take one up to qid prn pain 120 tablet 0  . lisinopril (ZESTRIL) 2.5 MG tablet Take 1 tablet (2.5 mg total) by mouth daily. 90 tablet 3  . meclizine (ANTIVERT) 25 MG tablet Take 1 tablet (25 mg total) by mouth 3 (three) times daily as needed for dizziness. 30 tablet 0  . metFORMIN (GLUCOPHAGE) 500 MG tablet TAKE 1 TABLET(500 MG) BY MOUTH TWICE DAILY WITH A MEAL 180 tablet 1  . metoprolol tartrate (LOPRESSOR) 100 MG tablet Take 1 tablet (100 mg total) by mouth 2 (two) times daily. 180 tablet 3  . nitroGLYCERIN (NITROSTAT) 0.4 MG SL tablet Place 1 tablet (0.4 mg total) under the tongue every 5 (five) minutes as needed. 25 tablet 3  . omeprazole (PRILOSEC) 40 MG capsule TAKE 1 CAPSULE(40 MG) BY MOUTH DAILY 90 capsule 1  . ondansetron (ZOFRAN-ODT) 4 MG disintegrating tablet Take 1 tablet (4 mg total) by mouth every 6 (six) hours as needed for nausea. 20 tablet 0  . potassium chloride SA (KLOR-CON) 20 MEQ tablet Take 1 tablet (20 mEq total) by mouth daily. 90 tablet 0  . rOPINIRole (REQUIP) 3 MG tablet TAKE 1 TABLET(3 MG) BY MOUTH AT BEDTIME 270 tablet 1  . sildenafil (VIAGRA) 100 MG tablet Take 1 tablet 30 minutes before activity 10 tablet 3  . sodium chloride (OCEAN) 0.65 % SOLN nasal spray Place 1 spray into both nostrils daily as needed for congestion.     . sucralfate (CARAFATE) 1 g tablet TAKE 1 TABLET BY MOUTH THREE TIMES  DAILY BEFORE MEALS 42 tablet 1  . tetrahydrozoline (VISINE) 0.05 % ophthalmic solution Place 2 drops into both eyes daily as needed (for dry eyes).    . traZODone (DESYREL) 50 MG tablet TAKE 2 TABLETS(100 MG) BY MOUTH AT BEDTIME 60 tablet 0  . triamcinolone cream (KENALOG) 0.1 % Apply 1 application topically 2 (two) times daily. 60 g 1  . umeclidinium-vilanterol (ANORO ELLIPTA) 62.5-25 MCG/INH AEPB Anoro Ellipta 62.5 mcg-25 mcg/actuation powder for inhalation  INL 1 PUFF ITL D     No current facility-administered medications for this visit.      Past Medical History:  Diagnosis Date  . Anemia   . Arteriosclerotic cardiovascular disease (ASCVD)    Nonobstructive; cath in 2000: 30-40% mid LAD and proximal RCA;normal EF. stress nuclear in 2006-subtle inferoseptal hypoperfusion with reversibility; negative stress EKG; good exercise tolerance  . Asthma   . Cancer ([place])    skin cancer  . CHF (congestive heart failure) ([place])   . Colon polyps    30 colon polyps found on first colonoscopy  . COPD (chronic obstructive pulmonary disease) ([place])   . Diabetes mellitus without complication ([place])   . Diverticulitis   . DJD (degenerative joint disease)   . GERD (gastroesophageal reflux disease)   . History of kidney stones   . Hyperlipidemia   . Hypertension   . Insomnia   . Obstructive sleep apnea 12/2009   01/26/2010 AHI 83/hr  . Paroxysmal atrial fibrillation ([place]) 10/2004   Onset in 10/2004; recurred 09/2008  . PUD (peptic ulcer disease)    1980s  . RLS (restless legs syndrome)   . Sinusitis     Past Surgical History:  Procedure Laterality Date  . BIOPSY  07/17/2018   Procedure: BIOPSY;  Surgeon: Danie Binder, MD;  [facility];  Service: Endoscopy;;  colon  . BOWEL RESECTION  09/17/2018   SMALL BOWEL RESECTION: 71 CM   . CARDIAC CATHETERIZATION N/A 07/21/2015   Procedure: Left Heart Cath and Coronary Angiography;  Surgeon: Peter M Martinique, MD;  Location: [place] [facility];  Service: Cardiovascular;  Laterality: N/A;  . CARDIAC CATHETERIZATION N/A 07/21/2015   Procedure: Coronary Stent Intervention;  Surgeon: Peter M Martinique, MD;  Location: [place] [facility];  Service: Cardiovascular;  Laterality: N/A;  . CIRCUMCISION N/A 04/05/2019   Procedure: CIRCUMCISION ADULT;  Surgeon: Irine Seal, MD;  Location: [facility];  Service: Urology;  Laterality: N/A;  . COLONOSCOPY N/A 05/19/2014   Dr. Barnie Alderman diverticulosis/moderate external hemorrhoids, >20 simple adenomas. Genetic screening negative.   . COLONOSCOPY WITH PROPOFOL  N/A 07/17/2018   Dr. Oneida Alar: Diverticulosis, external/internal hemorrhoids, 32 colon polyps removed.  ten tubular adenomas removed with no high-grade dysplasia.  Advised to have surveillance colonoscopy in 3 years.  . ESOPHAGOGASTRODUODENOSCOPY (EGD) WITH PROPOFOL N/A 07/17/2018   Dr. Oneida Alar: Low-grade narrowing Schatzki ring at the GE junction status post dilation.  Gastritis.  Biopsy with mild nonspecific reactive gastropathy.  No H. pylori.  Marland Kitchen GIVENS CAPSULE STUDY N/A 06/24/2019   Procedure: GIVENS CAPSULE STUDY;  Surgeon: Danie Binder, MD;  [facility];  Service: Endoscopy;  Laterality: N/A;  7:30am  . HERNIA REPAIR  1986   Left inguinal  . INTRAVASCULAR PRESSURE WIRE/FFR STUDY Left 06/08/2017   Procedure: INTRAVASCULAR PRESSURE WIRE/FFR STUDY;  Surgeon: Nelva Bush, MD;  Location: [place] [facility];  Service: Cardiovascular;  Laterality: Left;  LAD and CFX  . LAPAROTOMY N/A 09/17/2018   Procedure: EXPLORATORY LAPAROTOMY;  Surgeon:  Virl Cagey, MD;  Location: [facility];  Service: General;  Laterality: N/A;  . LEFT HEART CATH AND CORONARY ANGIOGRAPHY N/A 06/08/2017   Procedure: LEFT HEART CATH AND CORONARY ANGIOGRAPHY;  Surgeon: Nelva Bush, MD;  Location: [place] [facility];  Service: Cardiovascular;  Laterality: N/A;  . POLYPECTOMY  07/17/2018   Procedure: POLYPECTOMY;  Surgeon: Danie Binder, MD;  [facility];  Service: Endoscopy;;  colon  . ROTATOR CUFF REPAIR     Right  . SAVORY DILATION N/A 07/17/2018   Procedure: SAVORY DILATION;  Surgeon: Danie Binder, MD;  [facility];  Service: Endoscopy;  Laterality: N/A;    Social History   Socioeconomic History  . Marital status: Married    Spouse name: Not on file  . Number of children: 5  . Years of education: Not on file  . Highest education level: Not on file  Occupational History  . Occupation: employed    Fish farm manager: Education officer, [employer]    Comment: full-time  Tobacco Use  . Smoking  status: Current Every Day Smoker    Packs/day: 1.00    Years: 39.00    Pack years: 39.00    Types: Cigarettes    Start date: 07/24/1970  . Smokeless tobacco: Never Used  Substance and Sexual Activity  . Alcohol use: No    Alcohol/week: 0.0 standard drinks  . Drug use: No  . Sexual activity: Yes    Partners: Female  Other Topics Concern  . Not on file  Social History Narrative  . Not on file   Social Determinants of Health   Financial Resource Strain:   . Difficulty of Paying Living Expenses:   Food Insecurity:   . Worried About Charity fundraiser in the Last Year:   . Arboriculturist in the Last Year:   Transportation Needs:   . Film/video editor (Medical):   Marland Kitchen Lack of Transportation (Non-Medical):   Physical Activity:   . Days of Exercise per Week:   . Minutes of Exercise per Session:   Stress:   . Feeling of Stress :   Social Connections:   . Frequency of Communication with Friends and Family:   . Frequency of Social Gatherings with Friends and Family:   . Attends Religious Services:   . Active Member of Clubs or Organizations:   . Attends Archivist Meetings:   Marland Kitchen Marital Status:   Intimate Partner Violence:   . Fear of Current or Ex-Partner:   . Emotionally Abused:   Marland Kitchen Physically Abused:   . Sexually Abused:     Family History  Problem Relation Age of Onset  . Hypertension Mother   . Breast cancer Mother 41       brain/bone   . Heart attack Father   . Diabetes Brother   . Skin cancer Sister 66  . Brain cancer Maternal Uncle   . Cancer Maternal Uncle        NOS  . Breast cancer Cousin        maternal cousin dx <50  . Cancer Cousin   . Colon cancer Neg Hx     ROS: no fevers or chills, productive cough, hemoptysis, dysphasia, odynophagia, melena, hematochezia, dysuria, hematuria, rash, seizure activity, orthopnea, PND, pedal edema, claudication. Remaining systems are negative.  Physical Exam: Well-developed well-nourished in no acute  distress.  Skin is warm and dry.  HEENT is normal.  Neck is supple.  Chest is clear to auscultation  with normal expansion.  Cardiovascular exam is irregular Abdominal exam nontender or distended. No masses palpated. Extremities show no edema. neuro grossly intact  A/P  1 permanent atrial fibrillation-heart rate is controlled.  Continue present dose of metoprolol.  Continue apixaban.  Check hemoglobin and renal function.  2 coronary artery disease-patient has not had recurrent chest pain.  Continue statin.  No aspirin given need for apixaban.  3 hypertension-blood pressure controlled.  Continue present medical regimen.  4 hyperlipidemia-continue statin.  Check lipids and liver.  5 chronic diastolic congestive heart failure-patient is euvolemic.  Continue diuretic at present dose.  He does describe increased dyspnea on exertion.  Check BNP.  If elevated may need additional diuretic.  6 tobacco abuse-patient counseled on discontinuing.  7 obstructive sleep apnea-I encouraged continued CPAP use.  Kirk Ruths, MD

## 2019-12-12 ENCOUNTER — Other Ambulatory Visit: Payer: Self-pay

## 2019-12-12 DIAGNOSIS — I5032 Chronic diastolic (congestive) heart failure: Secondary | ICD-10-CM

## 2019-12-12 MED ORDER — FUROSEMIDE 20 MG PO TABS: ORAL_TABLET | ORAL | 1 refills | Status: DC

## 2019-12-13 ENCOUNTER — Other Ambulatory Visit: Payer: Self-pay

## 2019-12-16 ENCOUNTER — Other Ambulatory Visit: Payer: Self-pay

## 2019-12-16 ENCOUNTER — Ambulatory Visit: Payer: 59 | Admitting: Cardiology

## 2019-12-16 ENCOUNTER — Encounter: Payer: Self-pay | Admitting: Cardiology

## 2019-12-16 VITALS — BP 126/82 | HR 62 | Temp 97.9°F | Resp 20 | Ht 68.0 in | Wt 226.2 lb

## 2019-12-16 DIAGNOSIS — E785 Hyperlipidemia, unspecified: Secondary | ICD-10-CM

## 2019-12-16 DIAGNOSIS — I5032 Chronic diastolic (congestive) heart failure: Secondary | ICD-10-CM | POA: Diagnosis not present

## 2019-12-16 DIAGNOSIS — I482 Chronic atrial fibrillation, unspecified: Secondary | ICD-10-CM | POA: Diagnosis not present

## 2019-12-16 DIAGNOSIS — I2510 Atherosclerotic heart disease of native coronary artery without angina pectoris: Secondary | ICD-10-CM

## 2019-12-16 DIAGNOSIS — Z9861 Coronary angioplasty status: Secondary | ICD-10-CM

## 2019-12-16 NOTE — Patient Instructions (Signed)
Medication Instructions:  NO CHANGE *If you need a refill on your cardiac medications before your next appointment, please call your pharmacy*   Lab Work: Your physician recommends that you HAVE LAB WORK TODAY If you have labs (blood work) drawn today and your tests are completely normal, you will receive your results only by: . MyChart Message (if you have MyChart) OR . A paper copy in the mail If you have any lab test that is abnormal or we need to change your treatment, we will call you to review the results.   Follow-Up: At [facility], you and your health needs are our priority.  As part of our continuing mission to provide you with exceptional heart care, we have created designated Provider Care Teams.  These Care Teams include your primary Cardiologist (physician) and Advanced Practice Providers (APPs -  Physician Assistants and Nurse Practitioners) who all work together to provide you with the care you need, when you need it.  We recommend signing up for the patient portal called "MyChart".  Sign up information is provided on this After Visit Summary.  MyChart is used to connect with patients for Virtual Visits (Telemedicine).  Patients are able to view lab/test results, encounter notes, upcoming appointments, etc.  Non-urgent messages can be sent to your provider as well.   To learn more about what you can do with MyChart, go to [email or web address].    Your next appointment:   6 month(s)  The format for your next appointment:   Either In Person or Virtual  Provider:   You may see Brian Crenshaw, MD or one of the following Advanced Practice Providers on your designated Care Team:    Luke Kilroy, PA-C  Callie Goodrich, PA-C  Jesse Cleaver, FNP     

## 2019-12-17 LAB — COMPREHENSIVE METABOLIC PANEL
ALT: 19 IU/L (ref 0–44)
AST: 18 IU/L (ref 0–40)
Albumin/Globulin Ratio: 2 (ref 1.2–2.2)
Albumin: 4.1 g/dL (ref 3.8–4.8)
Alkaline Phosphatase: 106 IU/L (ref 39–117)
BUN/Creatinine Ratio: 18 (ref 10–24)
BUN: 15 mg/dL (ref 8–27)
Bilirubin Total: 0.3 mg/dL (ref 0.0–1.2)
CO2: 17 mmol/L — ABNORMAL LOW (ref 20–29)
Calcium: 8.9 mg/dL (ref 8.6–10.2)
Chloride: 105 mmol/L (ref 96–106)
Creatinine, Ser: 0.82 mg/dL (ref 0.76–1.27)
GFR calc Af Amer: 109 mL/min/{1.73_m2} (ref >59)
GFR calc non Af Amer: 94 mL/min/{1.73_m2} (ref >59)
Globulin, Total: 2.1 g/dL (ref 1.5–4.5)
Glucose: 113 mg/dL — ABNORMAL HIGH (ref 65–99)
Potassium: 4.8 mmol/L (ref 3.5–5.2)
Sodium: 141 mmol/L (ref 134–144)
Total Protein: 6.2 g/dL (ref 6.0–8.5)

## 2019-12-17 LAB — LIPID PANEL
Chol/HDL Ratio: 5.2 ratio — ABNORMAL HIGH (ref 0.0–5.0)
Cholesterol, Total: 129 mg/dL (ref 100–199)
HDL: 25 mg/dL — ABNORMAL LOW (ref >39)
LDL Chol Calc (NIH): 74 mg/dL (ref 0–99)
Triglycerides: 171 mg/dL — ABNORMAL HIGH (ref 0–149)
VLDL Cholesterol Cal: 30 mg/dL (ref 5–40)

## 2019-12-17 LAB — CBC
Hematocrit: 38 % (ref 37.5–51.0)
Hemoglobin: 12.7 g/dL — ABNORMAL LOW (ref 13.0–17.7)
MCH: 26.9 pg (ref 26.6–33.0)
MCHC: 33.4 g/dL (ref 31.5–35.7)
MCV: 81 fL (ref 79–97)
Platelets: 181 10*3/uL (ref 150–450)
RBC: 4.72 x10E6/uL (ref 4.14–5.80)
RDW: 14.6 % (ref 11.6–15.4)
WBC: 6.3 10*3/uL (ref 3.4–10.8)

## 2019-12-17 LAB — PRO B NATRIURETIC PEPTIDE: NT-Pro BNP: 197 pg/mL (ref 0–210)

## 2019-12-31 ENCOUNTER — Ambulatory Visit: Payer: 59 | Admitting: Gastroenterology

## 2019-12-31 ENCOUNTER — Encounter: Payer: Self-pay | Admitting: Gastroenterology

## 2019-12-31 ENCOUNTER — Other Ambulatory Visit: Payer: Self-pay

## 2019-12-31 VITALS — BP 142/92 | HR 105 | Temp 97.1°F | Ht 68.0 in | Wt 229.2 lb

## 2019-12-31 DIAGNOSIS — K219 Gastro-esophageal reflux disease without esophagitis: Secondary | ICD-10-CM

## 2019-12-31 DIAGNOSIS — R112 Nausea with vomiting, unspecified: Secondary | ICD-10-CM | POA: Diagnosis not present

## 2019-12-31 DIAGNOSIS — D500 Iron deficiency anemia secondary to blood loss (chronic): Secondary | ICD-10-CM

## 2019-12-31 MED ORDER — ONDANSETRON HCL 4 MG PO TABS: 4.0000 mg | ORAL_TABLET | Freq: Three times a day (TID) | ORAL | 1 refills | Status: DC | PRN

## 2019-12-31 NOTE — Patient Instructions (Addendum)
Continue Prilosec daily, making sure to take 30 minutes before breakfast on an empty stomach.   I have sent in a nausea medication called Zofran to take as needed for nausea.  You may have slowed stomach emptying due to diabetes and pain medication: make sure to eat smaller, frequent meals throughout the day. Call us if worsening symptoms despite eating healthy foods, changing portions sizes, any weight loss or abdominal pain, etc.   We will see you in 6 months!  It was a pleasure to see you today. I want to create trusting relationships with patients to provide genuine, compassionate, and quality care. I value your feedback. If you receive a survey regarding your visit,  I greatly appreciate you taking time to fill this out.   Annitta Needs, PhD, ANP-BC [facility]    Gastroparesis  Gastroparesis is a condition in which food takes longer than normal to empty from the stomach. The condition is usually long-lasting (chronic). It may also be called delayed gastric emptying. There is no cure, but there are treatments and things that you can do at home to help relieve symptoms. Treating the underlying condition that causes gastroparesis can also help relieve symptoms. What are the causes? In many cases, the cause of this condition is not known. Possible causes include:  A hormone (endocrine) disorder, such as hypothyroidism or diabetes.  A nervous system disease, such as Parkinson's disease or multiple sclerosis.  Cancer, infection, or surgery that affects the stomach or vagus nerve. The vagus nerve runs from your chest, through your neck, to the lower part of your brain.  A connective tissue disorder, such as scleroderma.  Certain medicines. What increases the risk? You are more likely to develop this condition if you:  Have certain disorders or diseases, including: ? An endocrine disorder. ? An eating disorder. ? Amyloidosis. ? Scleroderma. ? Parkinson's  disease. ? Multiple sclerosis. ? Cancer or infection of the stomach or the vagus nerve.  Have had surgery on the stomach or vagus nerve.  Take certain medicines.  Are male. What are the signs or symptoms? Symptoms of this condition include:  Feeling full after eating very little.  Nausea.  Vomiting.  Heartburn.  Abdominal bloating.  Inconsistent blood sugar (glucose) levels on blood tests.  Lack of appetite.  Weight loss.  Acid from the stomach coming up into the esophagus (gastroesophageal reflux).  Sudden tightening (spasm) of the stomach, which can be painful. Symptoms may come and go. Some people may not notice any symptoms. How is this diagnosed? This condition is diagnosed with tests, such as:  Tests that check how long it takes food to move through the stomach and intestines. These tests include: ? Upper gastrointestinal (GI) series. For this test, you drink a liquid that shows up well on X-rays, and then X-rays will be taken of your intestines. ? Gastric emptying scintigraphy. For this test, you eat food that contains a small amount of radioactive material, and then scans are taken. ? Wireless capsule GI monitoring system. For this test, you swallow a pill (capsule) that records information about how foods and fluid move through your stomach.  Gastric manometry. For this test, a tube is passed down your throat and into your stomach to measure electrical and muscular activity.  Endoscopy. For this test, a long, thin tube is passed down your throat and into your stomach to check for problems in your stomach lining.  Ultrasound. This test uses sound waves to create images of inside  the body. This can help rule out gallbladder disease or pancreatitis as a cause of your symptoms. How is this treated? There is no cure for gastroparesis. Treatment may include:  Treating the underlying cause.  Managing your symptoms by making changes to your diet and exercise  habits.  Taking medicines to control nausea and vomiting and to stimulate stomach muscles.  Getting food through a feeding tube in the hospital. This may be done in severe cases.  Having surgery to insert a device into your body that helps improve stomach emptying and control nausea and vomiting (gastric neurostimulator). Follow these instructions at home:  Take over-the-counter and prescription medicines only as told by your health care provider.  Follow instructions from your health care provider about eating or drinking restrictions. Your health care provider may recommend that you: ? Eat smaller meals more often. ? Eat low-fat foods. ? Eat low-fiber forms of high-fiber foods. For example, eat cooked vegetables instead of raw vegetables. ? Have only liquid foods instead of solid foods. Liquid foods are easier to digest.  Drink enough fluid to keep your urine pale yellow.  Exercise as often as told by your health care provider.  Keep all follow-up visits as told by your health care provider. This is important. Contact a health care provider if you:  Notice that your symptoms do not improve with treatment.  Have new symptoms. Get help right away if you:  Have severe abdominal pain that does not improve with treatment.  Have nausea that is severe or does not go away.  Cannot drink fluids without vomiting. Summary  Gastroparesis is a chronic condition in which food takes longer than normal to empty from the stomach.  Symptoms include nausea, vomiting, heartburn, abdominal bloating, and loss of appetite.  Eating smaller portions, and low-fat, low-fiber foods may help you manage your symptoms.  Get help right away if you have severe abdominal pain. This information is not intended to replace advice given to you by your health care provider. Make sure you discuss any questions you have with your health care provider. Document Revised: 11/13/2017 Document Reviewed:  06/20/2017 Elsevier Patient Education  2020 [employer].

## 2019-12-31 NOTE — [place] Notes (Signed)
Referring Provider: Mikey Kirschner, MD Primary Care Physician:  Mikey Kirschner, MD  Primary GI: Dr. Oneida Alar   Chief Complaint  Patient presents with  . Gastroesophageal Reflux    HPI:   Shaun [name] is a 64 y.o. male presenting today with a history of chronic microcytic anemia, with labs consistent with IDA. EGD/TCS 06/2018: low-grade narrowing Schatzki ring which was dilated.  Gastritis.  32 polyps removed from the colon, 10 were tubular adenomas.  Next colonoscopy 3 years (2022). Capsule study to complete GI work-up late 2020 was normal. On chronic Eliquis. Chronic GERD. Hgb continues to improve, with most recent 12.7. Normocytic.   Prilosec 40 mg once daily. Feels like symptoms are controlled for most part. Flare of GERD about once a week. Unable to find any triggers.  Nausea with vomiting once every 7-10 days. No triggers. No pain when this happens. Can happen in mornings or at night. Unsure if reflux is worse at that time. Good appetite. Some early satiety lately. Very little snacking during day. Small meals. Last A1c on file in this system 6.5 in Aug 2020. No dysphagia and has noted improvement since EGD in 2020. No abdominal pain. No weight loss. Has gained weight. Has had several bdays in last week and got off track. Chronic narcotics and recently increased.   No overt GI bleeding.   Past Medical History:  Diagnosis Date  . Anemia   . Arteriosclerotic cardiovascular disease (ASCVD)    Nonobstructive; cath in 2000: 30-40% mid LAD and proximal RCA;normal EF. stress nuclear in 2006-subtle inferoseptal hypoperfusion with reversibility; negative stress EKG; good exercise tolerance  . Asthma   . Cancer ([place])    skin cancer  . CHF (congestive heart failure) ([place])   . Colon polyps    30 colon polyps found on first colonoscopy  . COPD (chronic obstructive pulmonary disease) ([place])   . Diabetes mellitus without complication ([place])   . Diverticulitis   . DJD  (degenerative joint disease)   . GERD (gastroesophageal reflux disease)   . History of kidney stones   . Hyperlipidemia   . Hypertension   . Insomnia   . Obstructive sleep apnea 12/2009   01/26/2010 AHI 83/hr  . Paroxysmal atrial fibrillation ([place]) 10/2004   Onset in 10/2004; recurred 09/2008  . PUD (peptic ulcer disease)    1980s  . RLS (restless legs syndrome)   . Sinusitis     Past Surgical History:  Procedure Laterality Date  . BIOPSY  07/17/2018   Procedure: BIOPSY;  Surgeon: Danie Binder, MD;  [facility];  Service: Endoscopy;;  colon  . BOWEL RESECTION  09/17/2018   SMALL BOWEL RESECTION: 71 CM   . CARDIAC CATHETERIZATION N/A 07/21/2015   Procedure: Left Heart Cath and Coronary Angiography;  Surgeon: Peter M Martinique, MD;  Location: [place] [facility];  Service: Cardiovascular;  Laterality: N/A;  . CARDIAC CATHETERIZATION N/A 07/21/2015   Procedure: Coronary Stent Intervention;  Surgeon: Peter M Martinique, MD;  Location: [facility] [facility];  Service: Cardiovascular;  Laterality: N/A;  . CIRCUMCISION N/A 04/05/2019   Procedure: CIRCUMCISION ADULT;  Surgeon: Irine Seal, MD;  Location: [facility];  Service: Urology;  Laterality: N/A;  . COLONOSCOPY N/A 05/19/2014   Dr. Barnie Alderman diverticulosis/moderate external hemorrhoids, >20 simple adenomas. Genetic screening negative.   . COLONOSCOPY WITH PROPOFOL N/A 07/17/2018   Dr. Oneida Alar: Diverticulosis, external/internal hemorrhoids, 32 colon polyps removed.  ten tubular adenomas removed with  no high-grade dysplasia.  Advised to have surveillance colonoscopy in 3 years.  . ESOPHAGOGASTRODUODENOSCOPY (EGD) WITH PROPOFOL N/A 07/17/2018   Dr. Oneida Alar: Low-grade narrowing Schatzki ring at the GE junction status post dilation.  Gastritis.  Biopsy with mild nonspecific reactive gastropathy.  No H. pylori.  Marland Kitchen GIVENS CAPSULE STUDY N/A 06/24/2019   normal  . HERNIA REPAIR  1986   Left inguinal  . INTRAVASCULAR PRESSURE WIRE/FFR STUDY Left  06/08/2017   Procedure: INTRAVASCULAR PRESSURE WIRE/FFR STUDY;  Surgeon: Nelva Bush, MD;  Location: [place] [facility];  Service: Cardiovascular;  Laterality: Left;  LAD and CFX  . LAPAROTOMY N/A 09/17/2018   Procedure: EXPLORATORY LAPAROTOMY;  Surgeon: Virl Cagey, MD;  Location: [facility];  Service: General;  Laterality: N/A;  . LEFT HEART CATH AND CORONARY ANGIOGRAPHY N/A 06/08/2017   Procedure: LEFT HEART CATH AND CORONARY ANGIOGRAPHY;  Surgeon: Nelva Bush, MD;  Location: [place] [facility];  Service: Cardiovascular;  Laterality: N/A;  . POLYPECTOMY  07/17/2018   Procedure: POLYPECTOMY;  Surgeon: Danie Binder, MD;  [facility];  Service: Endoscopy;;  colon  . ROTATOR CUFF REPAIR     Right  . SAVORY DILATION N/A 07/17/2018   Procedure: SAVORY DILATION;  Surgeon: Danie Binder, MD;  [facility];  Service: Endoscopy;  Laterality: N/A;    Current Outpatient Medications  Medication Sig Dispense Refill  . albuterol (VENTOLIN HFA) 108 (90 Base) MCG/ACT inhaler INHALE 2 PUFFS INTO THE LUNGS EVERY 6 HOURS AS NEEDED FOR WHEEZING OR SHORTNESS OF BREATH 18 g 11  . atorvastatin (LIPITOR) 80 MG tablet TAKE 1 TABLET(80 MG) BY MOUTH EVERY EVENING 90 tablet 1  . buPROPion (WELLBUTRIN XL) 150 MG 24 hr tablet Take 3 tablets once daily 270 tablet 1  . docusate sodium (COLACE) 100 MG capsule Take 100 mg by mouth as needed for mild constipation.    Marland Kitchen ELIQUIS 5 MG TABS tablet TAKE 1 TABLET(5 MG) BY MOUTH TWICE DAILY 60 tablet 6  . escitalopram (LEXAPRO) 20 MG tablet TAKE 1 TABLET(20 MG) BY MOUTH DAILY 90 tablet 1  . furosemide (LASIX) 20 MG tablet TAKE 2 TABLETS(40 MG) BY MOUTH DAILY 90 tablet 1  . HYDROcodone-acetaminophen (NORCO/VICODIN) 5-325 MG tablet Take one tablet up to qid prn pain 120 tablet 0  . HYDROcodone-acetaminophen (NORCO/VICODIN) 5-325 MG tablet Take one tablet up to qid prn pain 120 tablet 0  . HYDROcodone-acetaminophen (NORCO/VICODIN) 5-325 MG  tablet Take one up to qid prn pain 120 tablet 0  . lisinopril (ZESTRIL) 2.5 MG tablet Take 1 tablet (2.5 mg total) by mouth daily. 90 tablet 3  . meclizine (ANTIVERT) 25 MG tablet Take 1 tablet (25 mg total) by mouth 3 (three) times daily as needed for dizziness. 30 tablet 0  . metFORMIN (GLUCOPHAGE) 500 MG tablet TAKE 1 TABLET(500 MG) BY MOUTH TWICE DAILY WITH A MEAL 180 tablet 1  . metoprolol tartrate (LOPRESSOR) 100 MG tablet Take 1 tablet (100 mg total) by mouth 2 (two) times daily. 180 tablet 3  . nitroGLYCERIN (NITROSTAT) 0.4 MG SL tablet Place 1 tablet (0.4 mg total) under the tongue every 5 (five) minutes as needed. 25 tablet 3  . omeprazole (PRILOSEC) 40 MG capsule TAKE 1 CAPSULE(40 MG) BY MOUTH DAILY 90 capsule 1  . ondansetron (ZOFRAN-ODT) 4 MG disintegrating tablet Take 1 tablet (4 mg total) by mouth every 6 (six) hours as needed for nausea. 20 tablet 0  . potassium chloride SA (KLOR-CON) 20 MEQ  tablet Take 1 tablet (20 mEq total) by mouth daily. 90 tablet 0  . rOPINIRole (REQUIP) 3 MG tablet TAKE 1 TABLET(3 MG) BY MOUTH AT BEDTIME 270 tablet 1  . sildenafil (VIAGRA) 100 MG tablet Take 1 tablet 30 minutes before activity 10 tablet 3  . sodium chloride (OCEAN) 0.65 % SOLN nasal spray Place 1 spray into both nostrils daily as needed for congestion.     . sucralfate (CARAFATE) 1 g tablet TAKE 1 TABLET BY MOUTH THREE TIMES DAILY BEFORE MEALS 42 tablet 1  . tetrahydrozoline (VISINE) 0.05 % ophthalmic solution Place 2 drops into both eyes daily as needed (for dry eyes).    . traZODone (DESYREL) 50 MG tablet TAKE 2 TABLETS(100 MG) BY MOUTH AT BEDTIME 60 tablet 0  . triamcinolone cream (KENALOG) 0.1 % Apply 1 application topically 2 (two) times daily. 60 g 1  . ondansetron (ZOFRAN) 4 MG tablet Take 1 tablet (4 mg total) by mouth every 8 (eight) hours as needed for nausea or vomiting. 30 tablet 1  . umeclidinium-vilanterol (ANORO ELLIPTA) 62.5-25 MCG/INH AEPB Anoro Ellipta 62.5 mcg-25  mcg/actuation powder for inhalation  INL 1 PUFF ITL D     No current facility-administered medications for this visit.    Allergies as of 12/31/2019  . (No Known Allergies)    Family History  Problem Relation Age of Onset  . Hypertension Mother   . Breast cancer Mother 33       brain/bone   . Heart attack Father   . Diabetes Brother   . Skin cancer Sister 96  . Brain cancer Maternal Uncle   . Cancer Maternal Uncle        NOS  . Breast cancer Cousin        maternal cousin dx <50  . Cancer Cousin   . Colon cancer Neg Hx     Social History   Socioeconomic History  . Marital status: Married    Spouse name: Not on file  . Number of children: 5  . Years of education: Not on file  . Highest education level: Not on file  Occupational History  . Occupation: employed    Fish farm manager: Education officer, [employer]    Comment: full-time  Tobacco Use  . Smoking status: Current Every Day Smoker    Packs/day: 1.00    Years: 39.00    Pack years: 39.00    Types: Cigarettes    Start date: 07/24/1970  . Smokeless tobacco: Never Used  Substance and Sexual Activity  . Alcohol use: No    Alcohol/week: 0.0 standard drinks  . Drug use: No  . Sexual activity: Yes    Partners: Female  Other Topics Concern  . Not on file  Social History Narrative  . Not on file   Social Determinants of Health   Financial Resource Strain:   . Difficulty of Paying Living Expenses:   Food Insecurity:   . Worried About Charity fundraiser in the Last Year:   . Arboriculturist in the Last Year:   Transportation Needs:   . Film/video editor (Medical):   Marland Kitchen Lack of Transportation (Non-Medical):   Physical Activity:   . Days of Exercise per Week:   . Minutes of Exercise per Session:   Stress:   . Feeling of Stress :   Social Connections:   . Frequency of Communication with Friends and Family:   . Frequency of Social Gatherings with Friends and Family:   . Attends Religious Services:   .  Active Member of Clubs or  Organizations:   . Attends Archivist Meetings:   Marland Kitchen Marital Status:     Review of Systems: Gen: Denies fever, chills, anorexia. Denies fatigue, weakness, weight loss.  CV: Denies chest pain, palpitations, syncope, peripheral edema, and claudication. Resp: Denies dyspnea at rest, cough, wheezing, coughing up blood, and pleurisy. GI: see HPI Derm: Denies rash, itching, dry skin Psych: Denies depression, anxiety, memory loss, confusion. No homicidal or suicidal ideation.  Heme: Denies bruising, bleeding, and enlarged lymph nodes.  Physical Exam: BP (!) 142/92   Pulse (!) 105   Temp (!) 97.1 F (36.2 C) (Temporal)   Ht 5\' 8"  (1.727 m)   Wt 229 lb 3.2 oz (104 kg)   BMI 34.85 kg/m  General:   Alert and oriented. No distress noted. Pleasant and cooperative.  Head:  Normocephalic and atraumatic. Eyes:  Conjuctiva clear without scleral icterus. Mouth:  Mask in place Abdomen:  +BS, soft, non-tender and non-distended. No rebound or guarding. No HSM or masses noted. Msk:  Symmetrical without gross deformities. Normal posture. Extremities:  Without edema. Neurologic:  Alert and  oriented x4 Psych:  Alert and cooperative. Normal mood and affect.  ASSESSMENT: Shaun [name] is a 64 y.o. male presenting today with history of IDA in setting of Eliquis, numerous colon polyps, gastritis, with capsule study normal. Hgb continues to improve and most recently 12.7. No overt GI bleeding. Due to numerous polyps, next colonoscopy will be in 2022.   GERD: Prilosec 40 mg daily. Occasional flares likely due to dietary choices. I do note he has had nausea with vomiting about once every 7-10 days. Gaining weight, no abdominal pain, no other alarm features. In setting of diabetes and chronic narcotics, likely dealing with delayed gastric emptying. He would like to follow this supportively for now. Discussed diet choices, will provide Zofran prn, and he is to call with any concerning persistent  symptoms. Will pursue GES at that time.    PLAN:   Continue PPI daily  Gastroparesis diet provided to start due to concern for delayed gastric emptying  Zofran prn  Call if no improvement and will pursue GES  Colonoscopy in 2022.  Return in 6 months or sooner if needed  Recheck CBC at next visit if not already done with PCP  Annitta Needs, PhD, ANP-BC [facility]

## 2020-01-02 LAB — BASIC METABOLIC PANEL
BUN/Creatinine Ratio: 14 (ref 10–24)
BUN: 13 mg/dL (ref 8–27)
CO2: 20 mmol/L (ref 20–29)
Calcium: 9.1 mg/dL (ref 8.6–10.2)
Chloride: 103 mmol/L (ref 96–106)
Creatinine, Ser: 0.95 mg/dL (ref 0.76–1.27)
GFR calc Af Amer: 98 mL/min/{1.73_m2} (ref >59)
GFR calc non Af Amer: 85 mL/min/{1.73_m2} (ref >59)
Glucose: 139 mg/dL — ABNORMAL HIGH (ref 65–99)
Potassium: 4.7 mmol/L (ref 3.5–5.2)
Sodium: 138 mmol/L (ref 134–144)

## 2020-01-02 LAB — LIPID PANEL
Chol/HDL Ratio: 6.4 ratio — ABNORMAL HIGH (ref 0.0–5.0)
Cholesterol, Total: 167 mg/dL (ref 100–199)
HDL: 26 mg/dL — ABNORMAL LOW (ref >39)
LDL Chol Calc (NIH): 91 mg/dL (ref 0–99)
Triglycerides: 295 mg/dL — ABNORMAL HIGH (ref 0–149)
VLDL Cholesterol Cal: 50 mg/dL — ABNORMAL HIGH (ref 5–40)

## 2020-01-02 LAB — CBC WITH DIFFERENTIAL/PLATELET
Basophils Absolute: 0 10*3/uL (ref 0.0–0.2)
Basos: 1 %
EOS (ABSOLUTE): 0.1 10*3/uL (ref 0.0–0.4)
Eos: 1 %
Hematocrit: 45.8 % (ref 37.5–51.0)
Hemoglobin: 14.6 g/dL (ref 13.0–17.7)
Immature Grans (Abs): 0 10*3/uL (ref 0.0–0.1)
Immature Granulocytes: 0 %
Lymphocytes Absolute: 1.7 10*3/uL (ref 0.7–3.1)
Lymphs: 27 %
MCH: 26.1 pg — ABNORMAL LOW (ref 26.6–33.0)
MCHC: 31.9 g/dL (ref 31.5–35.7)
MCV: 82 fL (ref 79–97)
Monocytes Absolute: 0.4 10*3/uL (ref 0.1–0.9)
Monocytes: 7 %
Neutrophils Absolute: 4.1 10*3/uL (ref 1.4–7.0)
Neutrophils: 64 %
Platelets: 180 10*3/uL (ref 150–450)
RBC: 5.6 x10E6/uL (ref 4.14–5.80)
RDW: 15 % (ref 11.6–15.4)
WBC: 6.4 10*3/uL (ref 3.4–10.8)

## 2020-01-02 LAB — HEPATIC FUNCTION PANEL
ALT: 24 IU/L (ref 0–44)
AST: 20 IU/L (ref 0–40)
Albumin: 4.1 g/dL (ref 3.8–4.8)
Alkaline Phosphatase: 95 IU/L (ref 39–117)
Bilirubin Total: 0.4 mg/dL (ref 0.0–1.2)
Bilirubin, Direct: 0.1 mg/dL (ref 0.00–0.40)
Total Protein: 6.5 g/dL (ref 6.0–8.5)

## 2020-01-02 LAB — HEMOGLOBIN A1C
Est. average glucose Bld gHb Est-mCnc: 148 mg/dL
Hgb A1c MFr Bld: 6.8 % — ABNORMAL HIGH (ref 4.8–5.6)

## 2020-01-02 LAB — MICROALBUMIN / CREATININE URINE RATIO
Creatinine, Urine: 259.9 mg/dL
Microalb/Creat Ratio: 8 mg/g creat (ref 0–29)
Microalbumin, Urine: 21.4 ug/mL

## 2020-01-02 LAB — PSA: Prostate Specific Ag, Serum: 0.5 ng/mL (ref 0.0–4.0)

## 2020-01-03 ENCOUNTER — Other Ambulatory Visit: Payer: Self-pay | Admitting: Family Medicine

## 2020-01-08 ENCOUNTER — Other Ambulatory Visit: Payer: Self-pay | Admitting: Family Medicine

## 2020-01-08 ENCOUNTER — Other Ambulatory Visit: Payer: Self-pay

## 2020-01-08 ENCOUNTER — Ambulatory Visit: Payer: 59 | Admitting: Family Medicine

## 2020-01-08 VITALS — BP 128/88 | Temp 96.8°F | Ht 68.0 in | Wt 225.0 lb

## 2020-01-08 DIAGNOSIS — E119 Type 2 diabetes mellitus without complications: Secondary | ICD-10-CM | POA: Diagnosis not present

## 2020-01-08 DIAGNOSIS — I10 Essential (primary) hypertension: Secondary | ICD-10-CM

## 2020-01-08 DIAGNOSIS — E785 Hyperlipidemia, unspecified: Secondary | ICD-10-CM

## 2020-01-08 DIAGNOSIS — Z0000 Encounter for general adult medical examination without abnormal findings: Secondary | ICD-10-CM | POA: Diagnosis not present

## 2020-01-08 DIAGNOSIS — Z79891 Long term (current) use of opiate analgesic: Secondary | ICD-10-CM

## 2020-01-08 MED ORDER — HYDROCODONE-ACETAMINOPHEN 5-325 MG PO TABS: ORAL_TABLET | ORAL | 0 refills | Status: DC

## 2020-01-08 MED ORDER — OMEPRAZOLE 40 MG PO CPDR: DELAYED_RELEASE_CAPSULE | ORAL | 1 refills | Status: DC

## 2020-01-08 MED ORDER — METFORMIN HCL 500 MG PO TABS: ORAL_TABLET | ORAL | 1 refills | Status: DC

## 2020-01-08 MED ORDER — ESCITALOPRAM OXALATE 20 MG PO TABS: ORAL_TABLET | ORAL | 1 refills | Status: DC

## 2020-01-08 MED ORDER — BUPROPION HCL ER (XL) 150 MG PO TB24: ORAL_TABLET | ORAL | 1 refills | Status: DC

## 2020-01-08 MED ORDER — ROPINIROLE HCL 3 MG PO TABS: ORAL_TABLET | ORAL | 1 refills | Status: DC

## 2020-01-08 MED ORDER — ATORVASTATIN CALCIUM 80 MG PO TABS: ORAL_TABLET | ORAL | 1 refills | Status: DC
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
## 2020-01-08 NOTE — Telephone Encounter (Signed)
Seen today. 

## 2020-01-08 NOTE — Telephone Encounter (Signed)
42 one bid prn five ref

## 2020-01-29 ENCOUNTER — Other Ambulatory Visit: Payer: Self-pay

## 2020-01-29 ENCOUNTER — Other Ambulatory Visit: Payer: Self-pay | Admitting: Cardiology

## 2020-01-29 MED ORDER — SILDENAFIL CITRATE 100 MG PO TABS: ORAL_TABLET | ORAL | 3 refills | Status: DC

## 2020-02-26 ENCOUNTER — Other Ambulatory Visit: Payer: Self-pay

## 2020-02-26 MED ORDER — POTASSIUM CHLORIDE CRYS ER 20 MEQ PO TBCR
20.0000 meq | EXTENDED_RELEASE_TABLET | Freq: Every day | ORAL | 3 refills | Status: DC
  Filled 2020-12-31: qty 90, 90d supply, fill #0

## 2020-03-07 ENCOUNTER — Other Ambulatory Visit (HOSPITAL_COMMUNITY): Payer: Self-pay | Admitting: Cardiology

## 2020-03-10 ENCOUNTER — Other Ambulatory Visit: Payer: Self-pay

## 2020-03-10 DIAGNOSIS — I4821 Permanent atrial fibrillation: Secondary | ICD-10-CM

## 2020-03-10 MED ORDER — METOPROLOL TARTRATE 100 MG PO TABS: 100.0000 mg | ORAL_TABLET | Freq: Two times a day (BID) | ORAL | 3 refills | Status: DC

## 2020-03-12 ENCOUNTER — Other Ambulatory Visit: Payer: Self-pay | Admitting: Cardiology

## 2020-03-12 MED FILL — traZODone HCL 50 MG TABS: 50 | 30 days supply | Qty: 60 | Fill #0

## 2020-03-13 ENCOUNTER — Other Ambulatory Visit: Payer: Self-pay

## 2020-03-13 ENCOUNTER — Telehealth: Payer: Self-pay | Admitting: Family Medicine

## 2020-03-13 ENCOUNTER — Other Ambulatory Visit: Payer: Self-pay | Admitting: Cardiology

## 2020-03-13 DIAGNOSIS — I5032 Chronic diastolic (congestive) heart failure: Secondary | ICD-10-CM

## 2020-03-13 MED ORDER — ATORVASTATIN CALCIUM 80 MG PO TABS: ORAL_TABLET | ORAL | 0 refills | Status: DC

## 2020-03-13 MED ORDER — METFORMIN HCL 500 MG PO TABS: ORAL_TABLET | ORAL | 0 refills | Status: DC

## 2020-03-13 MED ORDER — ESCITALOPRAM OXALATE 20 MG PO TABS: ORAL_TABLET | ORAL | 0 refills | Status: DC

## 2020-03-13 MED ORDER — FUROSEMIDE 20 MG PO TABS: 40.0000 mg | ORAL_TABLET | Freq: Every day | ORAL | 2 refills | Status: DC

## 2020-03-13 MED ORDER — OMEPRAZOLE 40 MG PO CPDR: DELAYED_RELEASE_CAPSULE | ORAL | 0 refills | Status: DC

## 2020-03-13 MED ORDER — BUPROPION HCL ER (XL) 150 MG PO TB24: ORAL_TABLET | ORAL | 0 refills | Status: DC

## 2020-03-13 NOTE — Telephone Encounter (Signed)
Pls send in 3 month supply of meds listed and have pt f/u for visit in next 2-3 months.  Thx. Dr. Lovena Le

## 2020-03-13 NOTE — Telephone Encounter (Signed)
Refills sent in to [facility]. Left detailed message on home phone(per DPR)

## 2020-03-13 NOTE — Telephone Encounter (Signed)
Please advise. Thank you (I will be off this afternoon; please sent to clinical pool. Thank you)

## 2020-03-13 NOTE — Telephone Encounter (Signed)
Rx(s) sent to pharmacy electronically.  

## 2020-03-13 NOTE — Telephone Encounter (Signed)
[facility] has called and requested Pt meds be transferred to them. RX needing refills omeprazole (PRILOSEC) 40 MG capsule,  buPROPion (WELLBUTRIN XL) 150 MG 24 hr tablet, metFORMIN (GLUCOPHAGE) 500 MG tablet,  escitalopram (LEXAPRO) 20 MG tablet, atorvastatin (LIPITOR) 80 MG tablet

## 2020-03-23 DIAGNOSIS — G4733 Obstructive sleep apnea (adult) (pediatric): Secondary | ICD-10-CM | POA: Diagnosis not present

## 2020-03-30 ENCOUNTER — Other Ambulatory Visit: Payer: Self-pay | Admitting: *Deleted

## 2020-03-30 MED ORDER — METFORMIN HCL 500 MG PO TABS: ORAL_TABLET | ORAL | 0 refills | Status: DC

## 2020-04-06 MED FILL — SILDENAFIL CITRATE 100 MG T: 100 | 50 days supply | Qty: 10 | Fill #0

## 2020-04-06 MED FILL — rOPINIRole HCL 3 MG TABS: 3 | 90 days supply | Qty: 90 | Fill #0

## 2020-04-21 ENCOUNTER — Ambulatory Visit: Payer: 59 | Admitting: Family Medicine

## 2020-04-23 DIAGNOSIS — G4733 Obstructive sleep apnea (adult) (pediatric): Secondary | ICD-10-CM | POA: Diagnosis not present

## 2020-04-28 ENCOUNTER — Other Ambulatory Visit: Payer: Self-pay

## 2020-04-28 ENCOUNTER — Ambulatory Visit: Payer: 59 | Admitting: Family Medicine

## 2020-04-28 ENCOUNTER — Encounter: Payer: Self-pay | Admitting: Family Medicine

## 2020-04-28 ENCOUNTER — Telehealth: Payer: Self-pay | Admitting: Family Medicine

## 2020-04-28 VITALS — BP 136/88 | HR 101 | Temp 97.5°F | Ht 68.0 in | Wt 218.0 lb

## 2020-04-28 DIAGNOSIS — E119 Type 2 diabetes mellitus without complications: Secondary | ICD-10-CM | POA: Diagnosis not present

## 2020-04-28 DIAGNOSIS — M545 Low back pain, unspecified: Secondary | ICD-10-CM

## 2020-04-28 DIAGNOSIS — G8929 Other chronic pain: Secondary | ICD-10-CM

## 2020-04-28 LAB — POCT GLYCOSYLATED HEMOGLOBIN (HGB A1C): Hemoglobin A1C: 6 % — AB (ref 4.0–5.6)

## 2020-04-28 NOTE — Progress Notes (Signed)
Patient ID: Shaun [name], male    DOB: [birth year], 64 y.o.   MRN: [id number]   Chief Complaint  Patient presents with  . pain management   Subjective:    HPI This patient was seen today for chronic pain  The medication list was reviewed and updated.   -Compliance with medication: takes one qid  - Number patient states they take daily: 4  -when was the last dose patient took? today  The patient was advised the importance of maintaining medication and not using illegal substances with these.  Here for refills and follow up  The patient was educated that we can provide 3 monthly scripts for their medication, it is their responsibility to follow the instructions.  Side effects or complications from medications: none  Patient is aware that pain medications are meant to minimize the severity of the pain to allow their pain levels to improve to allow for better function. They are aware of that pain medications cannot totally remove their pain.  Due for UDT ( at least once per year) : due today  Scale of 1 to 10 ( 1 is least 10 is most) Your pain level without the medicine: 7-8 Your pain level with medication 3-4  Scale 1 to 10 ( 1-helps very little, 10 helps very well) How well does your pain medication reduce your pain so you can function better through out the day? 3-4  Results for orders placed or performed in visit on 04/28/20  POCT glycosylated hemoglobin (Hb A1C)  Result Value Ref Range   Hemoglobin A1C 6.0 (A) 4.0 - 5.6 %   HbA1c POC (<> result, manual entry)     HbA1c, POC (prediabetic range)     HbA1c, POC (controlled diabetic range)           Medical History Naman has a past medical history of Anemia, Arteriosclerotic cardiovascular disease (ASCVD), Asthma, Cancer ([place]), CHF (congestive heart failure) ([place]), Colon polyps, COPD (chronic obstructive pulmonary disease) ([place]), Diabetes mellitus without complication ([place]), Diverticulitis, DJD  (degenerative joint disease), GERD (gastroesophageal reflux disease), History of kidney stones, Hyperlipidemia, Hypertension, Insomnia, Obstructive sleep apnea (12/2009), Paroxysmal atrial fibrillation ([place]) (10/2004), PUD (peptic ulcer disease), RLS (restless legs syndrome), and Sinusitis.   Outpatient Encounter Medications as of 04/28/2020  Medication Sig  . albuterol (VENTOLIN HFA) 108 (90 Base) MCG/ACT inhaler INHALE 2 PUFFS INTO THE LUNGS EVERY 6 HOURS AS NEEDED FOR WHEEZING OR SHORTNESS OF BREATH  . atorvastatin (LIPITOR) 80 MG tablet TAKE 1 TABLET(80 MG) BY MOUTH EVERY EVENING  . buPROPion (WELLBUTRIN XL) 150 MG 24 hr tablet Take 3 tablets once daily  . docusate sodium (COLACE) 100 MG capsule Take 100 mg by mouth as needed for mild constipation.  Marland Kitchen ELIQUIS 5 MG TABS tablet TAKE 1 TABLET(5 MG) BY MOUTH TWICE DAILY  . escitalopram (LEXAPRO) 20 MG tablet TAKE 1 TABLET(20 MG) BY MOUTH DAILY  . furosemide (LASIX) 20 MG tablet Take 2 tablets (40 mg total) by mouth daily.  Marland Kitchen HYDROcodone-acetaminophen (NORCO/VICODIN) 5-325 MG tablet Take one tablet up to qid prn pain  . HYDROcodone-acetaminophen (NORCO/VICODIN) 5-325 MG tablet Take one tablet up to qid prn pain  . HYDROcodone-acetaminophen (NORCO/VICODIN) 5-325 MG tablet Take one up to qid prn pain  . lisinopril (ZESTRIL) 2.5 MG tablet TAKE 1 TABLET(2.5 MG) BY MOUTH DAILY  . meclizine (ANTIVERT) 25 MG tablet Take 1 tablet (25 mg total) by mouth 3 (three) times daily as needed for dizziness.  . metFORMIN (GLUCOPHAGE) 500  MG tablet Take one tablet (500 mg) by mouth twice daily with a meal  . metoprolol tartrate (LOPRESSOR) 100 MG tablet Take 1 tablet (100 mg total) by mouth 2 (two) times daily.  . nitroGLYCERIN (NITROSTAT) 0.4 MG SL tablet Place 1 tablet (0.4 mg total) under the tongue every 5 (five) minutes as needed.  Marland Kitchen omeprazole (PRILOSEC) 40 MG capsule TAKE 1 CAPSULE(40 MG) BY MOUTH DAILY  . ondansetron (ZOFRAN) 4 MG tablet Take 1 tablet (4 mg  total) by mouth every 8 (eight) hours as needed for nausea or vomiting.  . potassium chloride SA (KLOR-CON) 20 MEQ tablet Take 1 tablet (20 mEq total) by mouth daily.  Marland Kitchen rOPINIRole (REQUIP) 3 MG tablet TAKE 1 TABLET(3 MG) BY MOUTH AT BEDTIME  . sildenafil (VIAGRA) 100 MG tablet Take 1 tablet 30 minutes before activity  . sodium chloride (OCEAN) 0.65 % SOLN nasal spray Place 1 spray into both nostrils daily as needed for congestion.   . sucralfate (CARAFATE) 1 g tablet Take one tablet po BID PRN  . tetrahydrozoline (VISINE) 0.05 % ophthalmic solution Place 2 drops into both eyes daily as needed (for dry eyes).  . traZODone (DESYREL) 50 MG tablet TAKE 2 TABLETS(100 MG) BY MOUTH AT BEDTIME  . triamcinolone cream (KENALOG) 0.1 % Apply 1 application topically 2 (two) times daily.  . [DISCONTINUED] ondansetron (ZOFRAN-ODT) 4 MG disintegrating tablet Take 1 tablet (4 mg total) by mouth every 6 (six) hours as needed for nausea.   No facility-administered encounter medications on file as of 04/28/2020.     Review of Systems  Constitutional: Negative for chills and fever.  HENT: Negative for congestion, rhinorrhea and sore throat.   Respiratory: Negative for cough, shortness of breath and wheezing.   Cardiovascular: Negative for chest pain and leg swelling.  Gastrointestinal: Negative for abdominal pain, diarrhea, nausea and vomiting.  Genitourinary: Negative for dysuria and frequency.  Musculoskeletal: Positive for back pain (chronic).  Skin: Negative for rash.  Neurological: Negative for dizziness, weakness and headaches.     Vitals BP 136/88   Pulse (!) 101   Temp (!) 97.5 F (36.4 C)   Ht 5\' 8"  (1.727 m)   Wt 218 lb (98.9 kg)   SpO2 100%   BMI 33.15 kg/m   Objective:   Physical Exam Pt left before exam completed.  Assessment and Plan   1. Chronic low back pain, unspecified back pain laterality, unspecified whether sciatica present  2. Diabetes mellitus without complication  ([place]) - POCT glycosylated hemoglobin (Hb A1C)   Long discussion about chronic pain medications, and the need to taper down, alternatives, or see pain specialist.   Pt was being disrespectful to me when discussing the need to refer to pain or ortho specialist for his chronic hip/backpain. Pt asking why I wouldn't continue at current dose.  When explaining this that there are policy with [place] and [employer] board of medicine that explain that I have the right to decrease, taper, or discontinue as needed.  he kept saying, "it's BS" and telling me to "stop talking and to move on."  As I was trying to give him a referral to pain management. He said he may have to find another provider.  And as he kept telling me to stop talking, I told him" he's not going to disrespect me in the office," and told him he could leave.   Would recommend pt to be dismissed and to seek another provider for care.   Dr. Lovena Le

## 2020-04-30 ENCOUNTER — Encounter: Payer: Self-pay | Admitting: Family Medicine

## 2020-05-05 NOTE — Telephone Encounter (Signed)
Certified letter sent on 04/30/20

## 2020-05-13 MED FILL — METFORMIN HCL 500 MG TABS: 500 | 90 days supply | Qty: 180 | Fill #0

## 2020-05-13 MED FILL — traZODone HCL 50 MG TABS: 50 | 30 days supply | Qty: 60 | Fill #1

## 2020-05-24 DIAGNOSIS — G4733 Obstructive sleep apnea (adult) (pediatric): Secondary | ICD-10-CM | POA: Diagnosis not present

## 2020-05-26 ENCOUNTER — Other Ambulatory Visit: Payer: Self-pay

## 2020-05-26 ENCOUNTER — Ambulatory Visit (INDEPENDENT_AMBULATORY_CARE_PROVIDER_SITE_OTHER): Payer: 59 | Admitting: Physician Assistant

## 2020-05-26 ENCOUNTER — Encounter: Payer: Self-pay | Admitting: Physician Assistant

## 2020-05-26 VITALS — BP 118/80 | HR 83 | Temp 98.1°F | Resp 16 | Ht 68.0 in | Wt 219.0 lb

## 2020-05-26 DIAGNOSIS — Z23 Encounter for immunization: Secondary | ICD-10-CM | POA: Diagnosis not present

## 2020-05-26 DIAGNOSIS — E119 Type 2 diabetes mellitus without complications: Secondary | ICD-10-CM

## 2020-05-26 DIAGNOSIS — I10 Essential (primary) hypertension: Secondary | ICD-10-CM | POA: Diagnosis not present

## 2020-05-26 DIAGNOSIS — I2510 Atherosclerotic heart disease of native coronary artery without angina pectoris: Secondary | ICD-10-CM | POA: Diagnosis not present

## 2020-05-26 DIAGNOSIS — F321 Major depressive disorder, single episode, moderate: Secondary | ICD-10-CM

## 2020-05-26 DIAGNOSIS — M1611 Unilateral primary osteoarthritis, right hip: Secondary | ICD-10-CM | POA: Diagnosis not present

## 2020-05-26 DIAGNOSIS — G4733 Obstructive sleep apnea (adult) (pediatric): Secondary | ICD-10-CM | POA: Diagnosis not present

## 2020-05-26 DIAGNOSIS — I482 Chronic atrial fibrillation, unspecified: Secondary | ICD-10-CM

## 2020-05-26 DIAGNOSIS — Z9861 Coronary angioplasty status: Secondary | ICD-10-CM

## 2020-05-26 DIAGNOSIS — G2581 Restless legs syndrome: Secondary | ICD-10-CM | POA: Diagnosis not present

## 2020-05-26 NOTE — Progress Notes (Signed)
Patient presents to clinic today to establish care. Was previously followed by Dr. Wolfgang Phoenix in [place] who recently retired.   Patient with history of ASCVD s/p coronary stent placement, CHF, HTN and Chronic atrial fibrillation, currently followed by Cardiology. Is currently on a regimen of Lopressor 100 mg BID, Lisinopril 2.5 mg QD, Furosemide 20 mg QD, Eliquis 5 mg BID and Atorvastatin 80 mg daily. Endorses taking daily as directed. Patient denies chest pain, palpitations, lightheadedness, dizziness, vision changes or frequent headaches.  Patient also with history of DM II, controlled without known complication. Is currently taking Metformin BID. Does not check glucose at home. . Notes has never had a foot examination. Denies numbness or tingling of hands/feet. Denies history of foot ulcer. Walking without difficulty. .   Patient also on a regimen of Requip for RLS with noted breakthrough symptoms. Notes remote history of iron deficiency but is not currently on supplementation.     Health Maintenance: Colonoscopy -- UTD.   Past Medical History:  Diagnosis Date  . Anemia   . Anxiety   . Arteriosclerotic cardiovascular disease (ASCVD)    Nonobstructive; cath in 2000: 30-40% mid LAD and proximal RCA;normal EF. stress nuclear in 2006-subtle inferoseptal hypoperfusion with reversibility; negative stress EKG; good exercise tolerance  . Asthma   . Cancer ([place])    skin cancer  . Cataract   . CHF (congestive heart failure) ([place])   . Colon polyps    30 colon polyps found on first colonoscopy  . COPD (chronic obstructive pulmonary disease) ([place])   . Diabetes mellitus without complication ([place])   . Diverticulitis   . DJD (degenerative joint disease)   . GERD (gastroesophageal reflux disease)   . History of kidney stones   . Hyperlipidemia   . Hypertension   . Insomnia   . Obstructive sleep apnea 12/2009   01/26/2010 AHI 83/hr  . Paroxysmal atrial fibrillation ([place]) 10/2004   Onset in  10/2004; recurred 09/2008  . PUD (peptic ulcer disease)    1980s  . RLS (restless legs syndrome)   . Sinusitis     Past Surgical History:  Procedure Laterality Date  . BIOPSY  07/17/2018   Procedure: BIOPSY;  Surgeon: Danie Binder, MD;  [facility];  Service: Endoscopy;;  colon  . BOWEL RESECTION  09/17/2018   SMALL BOWEL RESECTION: 71 CM   . CARDIAC CATHETERIZATION N/A 07/21/2015   Procedure: Left Heart Cath and Coronary Angiography;  Surgeon: Peter M Martinique, MD;  Location: [facility];  Service: Cardiovascular;  Laterality: N/A;  . CARDIAC CATHETERIZATION N/A 07/21/2015   Procedure: Coronary Stent Intervention;  Surgeon: Peter M Martinique, MD;  Location: [facility];  Service: Cardiovascular;  Laterality: N/A;  . CIRCUMCISION N/A 04/05/2019   Procedure: CIRCUMCISION ADULT;  Surgeon: Irine Seal, MD;  Location: [facility];  Service: Urology;  Laterality: N/A;  . COLONOSCOPY N/A 05/19/2014   Dr. Barnie Alderman diverticulosis/moderate external hemorrhoids, >20 simple adenomas. Genetic screening negative.   . COLONOSCOPY WITH PROPOFOL N/A 07/17/2018   Dr. Oneida Alar: Diverticulosis, external/internal hemorrhoids, 32 colon polyps removed.  ten tubular adenomas removed with no high-grade dysplasia.  Advised to have surveillance colonoscopy in 3 years.  . ESOPHAGOGASTRODUODENOSCOPY (EGD) WITH PROPOFOL N/A 07/17/2018   Dr. Oneida Alar: Low-grade narrowing Schatzki ring at the GE junction status post dilation.  Gastritis.  Biopsy with mild nonspecific reactive gastropathy.  No H. pylori.  Marland Kitchen GIVENS CAPSULE STUDY N/A 06/24/2019   normal  . HERNIA REPAIR  1986   Left inguinal  . INTRAVASCULAR PRESSURE WIRE/FFR STUDY Left 06/08/2017   Procedure: INTRAVASCULAR PRESSURE WIRE/FFR STUDY;  Surgeon: Nelva Bush, MD;  Location: [facility];  Service: Cardiovascular;  Laterality: Left;  LAD and CFX  . LAPAROTOMY N/A 09/17/2018   Procedure: EXPLORATORY LAPAROTOMY;  Surgeon: Virl Cagey, MD;  Location: [facility];  Service: General;  Laterality: N/A;  . LEFT HEART CATH AND CORONARY ANGIOGRAPHY N/A 06/08/2017   Procedure: LEFT HEART CATH AND CORONARY ANGIOGRAPHY;  Surgeon: Nelva Bush, MD;  Location: [facility];  Service: Cardiovascular;  Laterality: N/A;  . POLYPECTOMY  07/17/2018   Procedure: POLYPECTOMY;  Surgeon: Danie Binder, MD;  [facility];  Service: Endoscopy;;  colon  . ROTATOR CUFF REPAIR     Right  . SAVORY DILATION N/A 07/17/2018   Procedure: SAVORY DILATION;  Surgeon: Danie Binder, MD;  [facility];  Service: Endoscopy;  Laterality: N/A;    Current Outpatient Medications on File Prior to Visit  Medication Sig Dispense Refill  . albuterol (VENTOLIN HFA) 108 (90 Base) MCG/ACT inhaler INHALE 2 PUFFS INTO THE LUNGS EVERY 6 HOURS AS NEEDED FOR WHEEZING OR SHORTNESS OF BREATH 18 g 11  . atorvastatin (LIPITOR) 80 MG tablet TAKE 1 TABLET(80 MG) BY MOUTH EVERY EVENING 90 tablet 0  . buPROPion (WELLBUTRIN XL) 150 MG 24 hr tablet Take 3 tablets once daily 270 tablet 0  . docusate sodium (COLACE) 100 MG capsule Take 100 mg by mouth as needed for mild constipation.    Marland Kitchen ELIQUIS 5 MG TABS tablet TAKE 1 TABLET(5 MG) BY MOUTH TWICE DAILY 60 tablet 6  . escitalopram (LEXAPRO) 20 MG tablet TAKE 1 TABLET(20 MG) BY MOUTH DAILY 90 tablet 0  . furosemide (LASIX) 20 MG tablet Take 2 tablets (40 mg total) by mouth daily. 90 tablet 2  . HYDROcodone-acetaminophen (NORCO/VICODIN) 5-325 MG tablet Take one tablet up to qid prn pain 120 tablet 0  . HYDROcodone-acetaminophen (NORCO/VICODIN) 5-325 MG tablet Take one tablet up to qid prn pain 120 tablet 0  . HYDROcodone-acetaminophen (NORCO/VICODIN) 5-325 MG tablet Take one up to qid prn pain 120 tablet 0  . lisinopril (ZESTRIL) 2.5 MG tablet TAKE 1 TABLET(2.5 MG) BY MOUTH DAILY 90 tablet 3  . meclizine (ANTIVERT) 25 MG tablet Take 1 tablet (25 mg total) by mouth 3 (three) times daily as needed  for dizziness. 30 tablet 0  . metFORMIN (GLUCOPHAGE) 500 MG tablet Take one tablet (500 mg) by mouth twice daily with a meal 180 tablet 0  . metoprolol tartrate (LOPRESSOR) 100 MG tablet Take 1 tablet (100 mg total) by mouth 2 (two) times daily. 180 tablet 3  . nitroGLYCERIN (NITROSTAT) 0.4 MG SL tablet Place 1 tablet (0.4 mg total) under the tongue every 5 (five) minutes as needed. 25 tablet 3  . omeprazole (PRILOSEC) 40 MG capsule TAKE 1 CAPSULE(40 MG) BY MOUTH DAILY 90 capsule 0  . ondansetron (ZOFRAN) 4 MG tablet Take 1 tablet (4 mg total) by mouth every 8 (eight) hours as needed for nausea or vomiting. 30 tablet 1  . potassium chloride SA (KLOR-CON) 20 MEQ tablet Take 1 tablet (20 mEq total) by mouth daily. 90 tablet 3  . rOPINIRole (REQUIP) 3 MG tablet TAKE 1 TABLET(3 MG) BY MOUTH AT BEDTIME 270 tablet 1  . sildenafil (VIAGRA) 100 MG tablet Take 1 tablet 30 minutes before activity 10 tablet 3  . sodium chloride (OCEAN) 0.65 % SOLN  nasal spray Place 1 spray into both nostrils daily as needed for congestion.     . sucralfate (CARAFATE) 1 g tablet Take one tablet po BID PRN 42 tablet 5  . tetrahydrozoline (VISINE) 0.05 % ophthalmic solution Place 2 drops into both eyes daily as needed (for dry eyes).    . traZODone (DESYREL) 50 MG tablet TAKE 2 TABLETS(100 MG) BY MOUTH AT BEDTIME 60 tablet 5  . triamcinolone cream (KENALOG) 0.1 % Apply 1 application topically 2 (two) times daily. 60 g 1   No current facility-administered medications on file prior to visit.    No Known Allergies  Family History  Problem Relation Age of Onset  . Hypertension Mother   . Breast cancer Mother 66       brain/bone   . Heart attack Father   . Diabetes Brother   . Skin cancer Sister 46  . Brain cancer Maternal Uncle   . Cancer Maternal Uncle        NOS  . Breast cancer Cousin        maternal cousin dx <50  . Cancer Cousin   . Colon cancer Neg Hx     Social History   Socioeconomic History  . Marital  status: Married    Spouse name: Not on file  . Number of children: 5  . Years of education: Not on file  . Highest education level: Not on file  Occupational History  . Occupation: employed    Fish farm manager: [employer]    Comment: full-time  Tobacco Use  . Smoking status: Current Every Day Smoker    Packs/day: 0.50    Years: 39.00    Pack years: 19.50    Types: Cigarettes    Start date: 07/24/1970  . Smokeless tobacco: Never Used  Vaping Use  . Vaping Use: Never used  Substance and Sexual Activity  . Alcohol use: No    Alcohol/week: 0.0 standard drinks  . Drug use: No  . Sexual activity: Yes    Partners: Female  Other Topics Concern  . Not on file  Social History Narrative  . Not on file   Social Determinants of Health   Financial Resource Strain:   . Difficulty of Paying Living Expenses: Not on file  Food Insecurity:   . Worried About Charity fundraiser in the Last Year: Not on file  . Ran Out of Food in the Last Year: Not on file  Transportation Needs:   . Lack of Transportation (Medical): Not on file  . Lack of Transportation (Non-Medical): Not on file  Physical Activity:   . Days of Exercise per Week: Not on file  . Minutes of Exercise per Session: Not on file  Stress:   . Feeling of Stress : Not on file  Social Connections:   . Frequency of Communication with Friends and Family: Not on file  . Frequency of Social Gatherings with Friends and Family: Not on file  . Attends Religious Services: Not on file  . Active Member of Clubs or Organizations: Not on file  . Attends Archivist Meetings: Not on file  . Marital Status: Not on file  Intimate Partner Violence:   . Fear of Current or Ex-Partner: Not on file  . Emotionally Abused: Not on file  . Physically Abused: Not on file  . Sexually Abused: Not on file   ROS Pertinent ROS is listed in the HPI.   BP 118/80   Pulse 83   Temp 98.1  F (36.7 C) (Temporal)   Resp 16   Ht 5\' 8"  (1.727 m)   Wt 219  lb (99.3 kg)   SpO2 98%   BMI 33.30 kg/m   Physical Exam Vitals reviewed.  Constitutional:      Appearance: Normal appearance.  HENT:     Head: Normocephalic and atraumatic.     Right Ear: Tympanic membrane, ear canal and external ear normal.     Left Ear: Tympanic membrane, ear canal and external ear normal.  Eyes:     Conjunctiva/sclera: Conjunctivae normal.  Cardiovascular:     Heart sounds: Normal heart sounds.     Comments: Chronic irregularly irregular rhythm of atrial fibrillation noted. Rate-controlled.  Pulmonary:     Effort: Pulmonary effort is normal.     Breath sounds: Normal breath sounds.  Musculoskeletal:     Cervical back: Neck supple.  Neurological:     General: No focal deficit present.     Mental Status: He is alert and oriented to person, place, and time.  Psychiatric:        Mood and Affect: Mood normal.    Diabetic Foot Form - Detailed   Diabetic Foot Exam - detailed Diabetic Foot exam was performed with the following findings: Yes 05/26/2020  4:00 PM  Visual Foot Exam completed.: Yes  Can the patient see the bottom of their feet?: No Are the shoes appropriate in style and fit?: Yes Is there swelling or and abnormal foot shape?: No Is there a claw toe deformity?: No Is there elevated skin temparature?: No Is there foot or ankle muscle weakness?: No Normal Range of Motion: Yes Pulse Foot Exam completed.: Yes  Right posterior Tibialias: Present Left posterior Tibialias: Present  Right Dorsalis Pedis: Present Left Dorsalis Pedis: Present  Sensory Foot Exam Completed.: Yes Semmes-Weinstein Monofilament Test R Site 1-Great Toe: Pos L Site 1-Great Toe: Pos       Recent Results (from the past 2160 hour(s))  POCT glycosylated hemoglobin (Hb A1C)     Status: Abnormal   Collection Time: 04/28/20 10:11 AM  Result Value Ref Range   Hemoglobin A1C 6.0 (A) 4.0 - 5.6 %   HbA1c POC (<> result, manual entry)     HbA1c, POC (prediabetic range)     HbA1c,  POC (controlled diabetic range)     Assessment/Plan: 1. Chronic atrial fibrillation ([place]) 2. CAD S/P percutaneous coronary angioplasty 3. Essential hypertension BP normotensive. Asymptomatic. Continue management per Cardiology.   4. Obstructive sleep apnea Compliant with CPAP. Dietary and exercise recommendations for weight loss reviewed.   5. Non-insulin treated type 2 diabetes mellitus ([place]) Foot exam updated today. No abnormal findings. Continue current medication regimen. Plan for follow-up and A1C ever 6 months as long as A1C remains < 8.   6. RLS (restless legs syndrome) Will check CBC and iron panel today to see if low iron a contributory factor. If so, will start iron supplementation. If normal will consider adjusting requip or switching to alternative medication.  - CBC with Differential/Platelet - Comprehensive metabolic panel - Iron  7. Current moderate episode of major depressive disorder without prior episode ([place]) Stable. Continue current medication regimen.   8. Need for immunization against influenza - Flu Vaccine QUAD 36+ mos IM  9. Osteoarthritis of right hip, unspecified osteoarthritis type Referral to pain management placed as we do not prescribe chronic narcotic medications.   This visit occurred during the SARS-CoV-2 public health emergency.  Safety protocols were in place, including screening questions  prior to the visit, additional usage of staff PPE, and extensive cleaning of exam room while observing appropriate contact time as indicated for disinfecting solutions.     Leeanne Rio, PA-C

## 2020-05-26 NOTE — Patient Instructions (Signed)
I will get you set up with a pain specialist.  Your flu shot was updated today.  Your diabetic foot exam was updated today.  We will work on getting all of your previous records. Follow-up with Cardiology as scheduled.   You will be contacted for assessment by Pain Management.   Please go to the lab today for blood work.  I will call you with your results. We will alter treatment regimen(s) if indicated by your results.  If your iron is low we will work on supplementing to help with RLS symptoms. If normal we will adjust your regimen -- considering adding on Gabapentin which can help with RLS and chronic hip pain.

## 2020-05-27 LAB — CBC WITH DIFFERENTIAL/PLATELET
Basophils Absolute: 0.1 10*3/uL (ref 0.0–0.1)
Basophils Relative: 1 % (ref 0.0–3.0)
Eosinophils Absolute: 0.1 10*3/uL (ref 0.0–0.7)
Eosinophils Relative: 1.4 % (ref 0.0–5.0)
HCT: 41.6 % (ref 39.0–52.0)
Hemoglobin: 14 g/dL (ref 13.0–17.0)
Lymphocytes Relative: 23.1 % (ref 12.0–46.0)
Lymphs Abs: 2 10*3/uL (ref 0.7–4.0)
MCHC: 33.6 g/dL (ref 30.0–36.0)
MCV: 83.2 fl (ref 78.0–100.0)
Monocytes Absolute: 0.6 10*3/uL (ref 0.1–1.0)
Monocytes Relative: 7 % (ref 3.0–12.0)
Neutro Abs: 5.8 10*3/uL (ref 1.4–7.7)
Neutrophils Relative %: 67.5 % (ref 43.0–77.0)
Platelets: 168 10*3/uL (ref 150.0–400.0)
RBC: 5 Mil/uL (ref 4.22–5.81)
RDW: 14.5 % (ref 11.5–15.5)
WBC: 8.6 10*3/uL (ref 4.0–10.5)

## 2020-05-27 LAB — IRON: Iron: 61 ug/dL (ref 42–165)

## 2020-05-27 LAB — COMPREHENSIVE METABOLIC PANEL
ALT: 28 U/L (ref 0–53)
AST: 19 U/L (ref 0–37)
Albumin: 4.4 g/dL (ref 3.5–5.2)
Alkaline Phosphatase: 76 U/L (ref 39–117)
BUN: 14 mg/dL (ref 6–23)
CO2: 24 mEq/L (ref 19–32)
Calcium: 8.9 mg/dL (ref 8.4–10.5)
Chloride: 104 mEq/L (ref 96–112)
Creatinine, Ser: 1.05 mg/dL (ref 0.40–1.50)
GFR: 71.15 mL/min (ref >60.00)
Glucose, Bld: 94 mg/dL (ref 70–99)
Potassium: 4.2 mEq/L (ref 3.5–5.1)
Sodium: 138 mEq/L (ref 135–145)
Total Bilirubin: 0.4 mg/dL (ref 0.2–1.2)
Total Protein: 6.9 g/dL (ref 6.0–8.3)

## 2020-06-02 MED FILL — SILDENAFIL CITRATE 100 MG T: 100 | 50 days supply | Qty: 10 | Fill #1

## 2020-06-03 ENCOUNTER — Encounter: Payer: Self-pay | Admitting: Physical Medicine & Rehabilitation

## 2020-06-10 MED FILL — traZODone HCL 50 MG TABS: 50 | 30 days supply | Qty: 60 | Fill #2

## 2020-06-17 NOTE — Progress Notes (Signed)
HPI: FUpermanent atrial fibrillation, coronary artery disease, diabetes mellitus and hypertension. Patient is status post PCI of his circumflex in November 2016. Patient was seen in atrial fibrillation clinic in June 2018 to see if therewere optionsforrestoring sinus rhythm. Given long-standing atrial fibrillation and left atrial enlargement it was felt that rate control and anticoagulation indicated.Cardiac catheterization October 2018 showed a 30% distal circumflex. LV function was normal with normal LV filling pressure. Monitor 8/20 showed atrial fibrillation with PVCs or aberrantly conducted beats, rate controlled.  Echo 8/10 showed normal LV function, mild to moderate LAE. Also with chronic diastolic CHF.Since last seen,he denies increased dyspnea, chest pain, palpitations or syncope.  No bleeding.  Current Outpatient Medications  Medication Sig Dispense Refill  . albuterol (VENTOLIN HFA) 108 (90 Base) MCG/ACT inhaler INHALE 2 PUFFS INTO THE LUNGS EVERY 6 HOURS AS NEEDED FOR WHEEZING OR SHORTNESS OF BREATH 18 g 11  . atorvastatin (LIPITOR) 80 MG tablet TAKE 1 TABLET(80 MG) BY MOUTH EVERY EVENING 90 tablet 0  . buPROPion (WELLBUTRIN XL) 150 MG 24 hr tablet Take 3 tablets once daily 270 tablet 0  . docusate sodium (COLACE) 100 MG capsule Take 100 mg by mouth as needed for mild constipation.    Marland Kitchen ELIQUIS 5 MG TABS tablet TAKE 1 TABLET(5 MG) BY MOUTH TWICE DAILY 60 tablet 6  . escitalopram (LEXAPRO) 20 MG tablet TAKE 1 TABLET(20 MG) BY MOUTH DAILY 90 tablet 0  . furosemide (LASIX) 20 MG tablet Take 2 tablets (40 mg total) by mouth daily. 90 tablet 2  . HYDROcodone-acetaminophen (NORCO/VICODIN) 5-325 MG tablet Take one up to qid prn pain 120 tablet 0  . lisinopril (ZESTRIL) 2.5 MG tablet TAKE 1 TABLET(2.5 MG) BY MOUTH DAILY 90 tablet 3  . meclizine (ANTIVERT) 25 MG tablet Take 1 tablet (25 mg total) by mouth 3 (three) times daily as needed for dizziness. 30 tablet 0  . metFORMIN  (GLUCOPHAGE) 500 MG tablet Take one tablet (500 mg) by mouth twice daily with a meal 180 tablet 0  . metoprolol tartrate (LOPRESSOR) 100 MG tablet Take 1 tablet (100 mg total) by mouth 2 (two) times daily. 180 tablet 3  . nitroGLYCERIN (NITROSTAT) 0.4 MG SL tablet Place 1 tablet (0.4 mg total) under the tongue every 5 (five) minutes as needed. 25 tablet 3  . omeprazole (PRILOSEC) 40 MG capsule TAKE 1 CAPSULE(40 MG) BY MOUTH DAILY 90 capsule 0  . ondansetron (ZOFRAN) 4 MG tablet Take 1 tablet (4 mg total) by mouth every 8 (eight) hours as needed for nausea or vomiting. 30 tablet 1  . potassium chloride SA (KLOR-CON) 20 MEQ tablet Take 1 tablet (20 mEq total) by mouth daily. 90 tablet 3  . rOPINIRole (REQUIP) 3 MG tablet TAKE 1 TABLET(3 MG) BY MOUTH AT BEDTIME 270 tablet 1  . sildenafil (VIAGRA) 100 MG tablet Take 1 tablet 30 minutes before activity 10 tablet 3  . sodium chloride (OCEAN) 0.65 % SOLN nasal spray Place 1 spray into both nostrils daily as needed for congestion.     . sucralfate (CARAFATE) 1 g tablet Take one tablet po BID PRN 42 tablet 5  . tetrahydrozoline (VISINE) 0.05 % ophthalmic solution Place 2 drops into both eyes daily as needed (for dry eyes).    . traZODone (DESYREL) 50 MG tablet TAKE 2 TABLETS(100 MG) BY MOUTH AT BEDTIME 60 tablet 5  . triamcinolone cream (KENALOG) 0.1 % Apply 1 application topically 2 (two) times daily. (Patient taking differently: Apply  1 application topically 2 (two) times daily. Pt takes as needed.) 60 g 1   No current facility-administered medications for this visit.     Past Medical History:  Diagnosis Date  . Anemia   . Anxiety   . Arteriosclerotic cardiovascular disease (ASCVD)    Nonobstructive; cath in 2000: 30-40% mid LAD and proximal RCA;normal EF. stress nuclear in 2006-subtle inferoseptal hypoperfusion with reversibility; negative stress EKG; good exercise tolerance  . Asthma   . Cancer ([place])    skin cancer  . Cataract   . CHF  (congestive heart failure) ([place])   . Colon polyps    30 colon polyps found on first colonoscopy  . COPD (chronic obstructive pulmonary disease) ([place])   . Diabetes mellitus without complication ([place])   . Diverticulitis   . DJD (degenerative joint disease)   . GERD (gastroesophageal reflux disease)   . History of kidney stones   . Hyperlipidemia   . Hypertension   . Insomnia   . Obstructive sleep apnea 12/2009   01/26/2010 AHI 83/hr  . Paroxysmal atrial fibrillation ([place]) 10/2004   Onset in 10/2004; recurred 09/2008  . PUD (peptic ulcer disease)    1980s  . RLS (restless legs syndrome)   . Sinusitis     Past Surgical History:  Procedure Laterality Date  . BIOPSY  07/17/2018   Procedure: BIOPSY;  Surgeon: Danie Binder, MD;  [facility];  Service: Endoscopy;;  colon  . BOWEL RESECTION  09/17/2018   SMALL BOWEL RESECTION: 71 CM   . CARDIAC CATHETERIZATION N/A 07/21/2015   Procedure: Left Heart Cath and Coronary Angiography;  Surgeon: Peter M Martinique, MD;  Location: [place] [facility];  Service: Cardiovascular;  Laterality: N/A;  . CARDIAC CATHETERIZATION N/A 07/21/2015   Procedure: Coronary Stent Intervention;  Surgeon: Peter M Martinique, MD;  Location: [place] [facility];  Service: Cardiovascular;  Laterality: N/A;  . CIRCUMCISION N/A 04/05/2019   Procedure: CIRCUMCISION ADULT;  Surgeon: Irine Seal, MD;  Location: [facility];  Service: Urology;  Laterality: N/A;  . COLONOSCOPY N/A 05/19/2014   Dr. Barnie Alderman diverticulosis/moderate external hemorrhoids, >20 simple adenomas. Genetic screening negative.   . COLONOSCOPY WITH PROPOFOL N/A 07/17/2018   Dr. Oneida Alar: Diverticulosis, external/internal hemorrhoids, 32 colon polyps removed.  ten tubular adenomas removed with no high-grade dysplasia.  Advised to have surveillance colonoscopy in 3 years.  . ESOPHAGOGASTRODUODENOSCOPY (EGD) WITH PROPOFOL N/A 07/17/2018   Dr. Oneida Alar: Low-grade narrowing Schatzki ring at the GE junction status  post dilation.  Gastritis.  Biopsy with mild nonspecific reactive gastropathy.  No H. pylori.  Marland Kitchen GIVENS CAPSULE STUDY N/A 06/24/2019   normal  . HERNIA REPAIR  1986   Left inguinal  . INTRAVASCULAR PRESSURE WIRE/FFR STUDY Left 06/08/2017   Procedure: INTRAVASCULAR PRESSURE WIRE/FFR STUDY;  Surgeon: Nelva Bush, MD;  Location: [place] [facility];  Service: Cardiovascular;  Laterality: Left;  LAD and CFX  . LAPAROTOMY N/A 09/17/2018   Procedure: EXPLORATORY LAPAROTOMY;  Surgeon: Virl Cagey, MD;  Location: [facility];  Service: General;  Laterality: N/A;  . LEFT HEART CATH AND CORONARY ANGIOGRAPHY N/A 06/08/2017   Procedure: LEFT HEART CATH AND CORONARY ANGIOGRAPHY;  Surgeon: Nelva Bush, MD;  Location: [place] [facility];  Service: Cardiovascular;  Laterality: N/A;  . POLYPECTOMY  07/17/2018   Procedure: POLYPECTOMY;  Surgeon: Danie Binder, MD;  [facility];  Service: Endoscopy;;  colon  . ROTATOR CUFF REPAIR     Right  . SAVORY  DILATION N/A 07/17/2018   Procedure: SAVORY DILATION;  Surgeon: Danie Binder, MD;  [facility];  Service: Endoscopy;  Laterality: N/A;    Social History   Socioeconomic History  . Marital status: Married    Spouse name: Not on file  . Number of children: 5  . Years of education: Not on file  . Highest education level: Not on file  Occupational History  . Occupation: employed    Fish farm manager: Education officer, [employer]    Comment: full-time  Tobacco Use  . Smoking status: Current Every Day Smoker    Packs/day: 0.50    Years: 39.00    Pack years: 19.50    Types: Cigarettes    Start date: 07/24/1970  . Smokeless tobacco: Never Used  Vaping Use  . Vaping Use: Never used  Substance and Sexual Activity  . Alcohol use: No    Alcohol/week: 0.0 standard drinks  . Drug use: No  . Sexual activity: Yes    Partners: Female  Other Topics Concern  . Not on file  Social History Narrative  . Not on file   Social Determinants of Health    Financial Resource Strain:   . Difficulty of Paying Living Expenses: Not on file  Food Insecurity:   . Worried About Charity fundraiser in the Last Year: Not on file  . Ran Out of Food in the Last Year: Not on file  Transportation Needs:   . Lack of Transportation (Medical): Not on file  . Lack of Transportation (Non-Medical): Not on file  Physical Activity:   . Days of Exercise per Week: Not on file  . Minutes of Exercise per Session: Not on file  Stress:   . Feeling of Stress : Not on file  Social Connections:   . Frequency of Communication with Friends and Family: Not on file  . Frequency of Social Gatherings with Friends and Family: Not on file  . Attends Religious Services: Not on file  . Active Member of Clubs or Organizations: Not on file  . Attends Archivist Meetings: Not on file  . Marital Status: Not on file  Intimate Partner Violence:   . Fear of Current or Ex-Partner: Not on file  . Emotionally Abused: Not on file  . Physically Abused: Not on file  . Sexually Abused: Not on file    Family History  Problem Relation Age of Onset  . Hypertension Mother   . Breast cancer Mother 20       brain/bone   . Heart attack Father   . Diabetes Brother   . Skin cancer Sister 27  . Brain cancer Maternal Uncle   . Cancer Maternal Uncle        NOS  . Breast cancer Cousin        maternal cousin dx <50  . Cancer Cousin   . Colon cancer Neg Hx     ROS: no fevers or chills, productive cough, hemoptysis, dysphasia, odynophagia, melena, hematochezia, dysuria, hematuria, rash, seizure activity, orthopnea, PND, pedal edema, claudication. Remaining systems are negative.  Physical Exam: Well-developed well-nourished in no acute distress.  Skin is warm and dry.  HEENT is normal.  Neck is supple.  Chest is clear to auscultation with normal expansion.  Cardiovascular exam is irregular Abdominal exam nontender or distended. No masses palpated.  Bruit  noted Extremities show no edema. neuro grossly intact  ECG-atrial fibrillation at a rate of 73, RV conduction delay.  Personally reviewed  A/P  1 permanent atrial fibrillation-patient's heart rate is controlled at present.  Continue metoprolol.  Continue apixaban.  2 chronic diastolic congestive heart failure-he appears to be euvolemic on examination.  Continue diuretic at present dose.   3 coronary artery disease-patient denies chest pain.  Continue medical therapy.  He is not on aspirin given need for apixaban.  Continue statin.  4 hypertension-patient's blood pressure is controlled today.  Continue present medications and follow.  5 hyperlipidemia-continue statin.  Most recent LDL 91.  Add Zetia 10 mg daily.  Check lipids and liver in 12 weeks.  6 tobacco abuse-patient counseled on discontinuing.  7 obstructive sleep apnea-continue CPAP.  8 bruit-schedule abdominal ultrasound to exclude aneurysm.  Kirk Ruths, MD

## 2020-06-23 ENCOUNTER — Encounter: Payer: Self-pay | Admitting: Cardiology

## 2020-06-23 ENCOUNTER — Other Ambulatory Visit: Payer: Self-pay

## 2020-06-23 ENCOUNTER — Encounter: Payer: Self-pay | Admitting: Physical Medicine & Rehabilitation

## 2020-06-23 ENCOUNTER — Encounter: Payer: 59 | Attending: Physical Medicine & Rehabilitation | Admitting: Physical Medicine & Rehabilitation

## 2020-06-23 ENCOUNTER — Other Ambulatory Visit: Payer: Self-pay | Admitting: Cardiology

## 2020-06-23 ENCOUNTER — Ambulatory Visit: Payer: 59 | Admitting: Cardiology

## 2020-06-23 VITALS — BP 108/75 | HR 75 | Temp 98.2°F | Ht 68.0 in | Wt 222.0 lb

## 2020-06-23 VITALS — BP 102/78 | HR 73 | Ht 68.0 in | Wt 223.0 lb

## 2020-06-23 DIAGNOSIS — I2510 Atherosclerotic heart disease of native coronary artery without angina pectoris: Secondary | ICD-10-CM | POA: Diagnosis not present

## 2020-06-23 DIAGNOSIS — G894 Chronic pain syndrome: Secondary | ICD-10-CM | POA: Insufficient documentation

## 2020-06-23 DIAGNOSIS — E785 Hyperlipidemia, unspecified: Secondary | ICD-10-CM

## 2020-06-23 DIAGNOSIS — Z5181 Encounter for therapeutic drug level monitoring: Secondary | ICD-10-CM | POA: Diagnosis not present

## 2020-06-23 DIAGNOSIS — Z79891 Long term (current) use of opiate analgesic: Secondary | ICD-10-CM | POA: Insufficient documentation

## 2020-06-23 DIAGNOSIS — I4821 Permanent atrial fibrillation: Secondary | ICD-10-CM

## 2020-06-23 DIAGNOSIS — Z9861 Coronary angioplasty status: Secondary | ICD-10-CM

## 2020-06-23 DIAGNOSIS — G4733 Obstructive sleep apnea (adult) (pediatric): Secondary | ICD-10-CM | POA: Diagnosis not present

## 2020-06-23 MED ORDER — EZETIMIBE 10 MG PO TABS: 10.0000 mg | ORAL_TABLET | Freq: Every day | ORAL | 3 refills | Status: DC

## 2020-06-23 MED FILL — rOPINIRole HCL 3 MG TABS: 3 | 90 days supply | Qty: 90 | Fill #1

## 2020-06-23 NOTE — Patient Instructions (Signed)
Medication Instructions:   START EZETIMIBE 10 MG ONCE DAILY  *If you need a refill on your cardiac medications before your next appointment, please call your pharmacy*   Lab Work:  Your physician recommends that you return for lab work in: 12 WEEKS -FASTING  If you have labs (blood work) drawn today and your tests are completely normal, you will receive your results only by: . MyChart Message (if you have MyChart) OR . A paper copy in the mail If you have any lab test that is abnormal or we need to change your treatment, we will call you to review the results.   Follow-Up: At [facility], you and your health needs are our priority.  As part of our continuing mission to provide you with exceptional heart care, we have created designated Provider Care Teams.  These Care Teams include your primary Cardiologist (physician) and Advanced Practice Providers (APPs -  Physician Assistants and Nurse Practitioners) who all work together to provide you with the care you need, when you need it.  We recommend signing up for the patient portal called "MyChart".  Sign up information is provided on this After Visit Summary.  MyChart is used to connect with patients for Virtual Visits (Telemedicine).  Patients are able to view lab/test results, encounter notes, upcoming appointments, etc.  Non-urgent messages can be sent to your provider as well.   To learn more about what you can do with MyChart, go to [email or web address].    Your next appointment:   12 month(s)  The format for your next appointment:   In Person  Provider:   Brian Crenshaw, MD    

## 2020-06-23 NOTE — Patient Instructions (Signed)
Will do xray guided RIght hip injection  Check oral swab , if ok try tramadol

## 2020-06-23 NOTE — Progress Notes (Signed)
Subjective:    Patient ID: Shaun [name], male    DOB: Apr 28, 1956, 64 y.o.   MRN: [id number]  HPI CC:  RIght hip and Right low back pain   64 yo with 4-5 yr history of hip and low back pain.  No injury .  No prior surgery.  Pain worsened over the last 1 year.  Dr prescribed  hydrocodone 5mg  3 times a day but this was only for a month or 2.  He saw his primary physician at that time and there apparently was a disagreement as to the appropriateness of narcotic analgesics which resulted in discharge from practice.  The patient states that he really did not like the way hydrocodone made him feel.  He is open to other treatment alternatives.  The patient states that it does not feel like the low back pain and hip pain are necessarily connected.  There is no numbness or tingling in the right lower extremity.  No progressive weakness in the right lower extremity.  No new bowel or bladder dysfunction Currently taking tylenol 4 tabs per day, minimal pain relief  occ uses cane for a short period of pain Right hip x-rays showed minimal degenerative changes in 2018. Pain Inventory Average Pain 6 Pain Right Now 4 My pain is sharp and stabbing  In the last 24 hours, has pain interfered with the following? General activity 5 Relation with others 3 Enjoyment of life 5 What TIME of day is your pain at its worst? daytime Sleep (in general) Fair  Pain is worse with: walking, bending, sitting and standing Pain improves with: heat/ice and medication Relief from Meds: 8  walk without assistance use a cane how many minutes can you walk? 10-15 ability to climb steps?  yes do you drive?  yes  not employed: date last employed 12/17/18  weakness trouble walking dizziness  Any changes since last visit?  no  Any changes since last visit?  no Primary care Raiford Noble PA    Family History  Problem Relation Age of Onset  . Hypertension Mother   . Breast cancer Mother 1       brain/bone    . Heart attack Father   . Diabetes Brother   . Skin cancer Sister 42  . Brain cancer Maternal Uncle   . Cancer Maternal Uncle        NOS  . Breast cancer Cousin        maternal cousin dx <50  . Cancer Cousin   . Colon cancer Neg Hx    Social History   Socioeconomic History  . Marital status: Married    Spouse name: Not on file  . Number of children: 5  . Years of education: Not on file  . Highest education level: Not on file  Occupational History  . Occupation: employed    Fish farm manager: Education officer, [employer]    Comment: full-time  Tobacco Use  . Smoking status: Current Every Day Smoker    Packs/day: 0.50    Years: 39.00    Pack years: 19.50    Types: Cigarettes    Start date: 07/24/1970  . Smokeless tobacco: Never Used  Vaping Use  . Vaping Use: Never used  Substance and Sexual Activity  . Alcohol use: No    Alcohol/week: 0.0 standard drinks  . Drug use: No  . Sexual activity: Yes    Partners: Female  Other Topics Concern  . Not on file  Social History Narrative  . Not on  file   Social Determinants of Health   Financial Resource Strain:   . Difficulty of Paying Living Expenses: Not on file  Food Insecurity:   . Worried About Charity fundraiser in the Last Year: Not on file  . Ran Out of Food in the Last Year: Not on file  Transportation Needs:   . Lack of Transportation (Medical): Not on file  . Lack of Transportation (Non-Medical): Not on file  Physical Activity:   . Days of Exercise per Week: Not on file  . Minutes of Exercise per Session: Not on file  Stress:   . Feeling of Stress : Not on file  Social Connections:   . Frequency of Communication with Friends and Family: Not on file  . Frequency of Social Gatherings with Friends and Family: Not on file  . Attends Religious Services: Not on file  . Active Member of Clubs or Organizations: Not on file  . Attends Archivist Meetings: Not on file  . Marital Status: Not on file   Past Surgical History:    Procedure Laterality Date  . BIOPSY  07/17/2018   Procedure: BIOPSY;  Surgeon: Danie Binder, MD;  [facility];  Service: Endoscopy;;  colon  . BOWEL RESECTION  09/17/2018   SMALL BOWEL RESECTION: 71 CM   . CARDIAC CATHETERIZATION N/A 07/21/2015   Procedure: Left Heart Cath and Coronary Angiography;  Surgeon: Peter M Martinique, MD;  Location: [place] [facility];  Service: Cardiovascular;  Laterality: N/A;  . CARDIAC CATHETERIZATION N/A 07/21/2015   Procedure: Coronary Stent Intervention;  Surgeon: Peter M Martinique, MD;  Location: [place] [facility];  Service: Cardiovascular;  Laterality: N/A;  . CIRCUMCISION N/A 04/05/2019   Procedure: CIRCUMCISION ADULT;  Surgeon: Irine Seal, MD;  Location: [facility];  Service: Urology;  Laterality: N/A;  . COLONOSCOPY N/A 05/19/2014   Dr. Barnie Alderman diverticulosis/moderate external hemorrhoids, >20 simple adenomas. Genetic screening negative.   . COLONOSCOPY WITH PROPOFOL N/A 07/17/2018   Dr. Oneida Alar: Diverticulosis, external/internal hemorrhoids, 32 colon polyps removed.  ten tubular adenomas removed with no high-grade dysplasia.  Advised to have surveillance colonoscopy in 3 years.  . ESOPHAGOGASTRODUODENOSCOPY (EGD) WITH PROPOFOL N/A 07/17/2018   Dr. Oneida Alar: Low-grade narrowing Schatzki ring at the GE junction status post dilation.  Gastritis.  Biopsy with mild nonspecific reactive gastropathy.  No H. pylori.  Marland Kitchen GIVENS CAPSULE STUDY N/A 06/24/2019   normal  . HERNIA REPAIR  1986   Left inguinal  . INTRAVASCULAR PRESSURE WIRE/FFR STUDY Left 06/08/2017   Procedure: INTRAVASCULAR PRESSURE WIRE/FFR STUDY;  Surgeon: Nelva Bush, MD;  Location: [place] [facility];  Service: Cardiovascular;  Laterality: Left;  LAD and CFX  . LAPAROTOMY N/A 09/17/2018   Procedure: EXPLORATORY LAPAROTOMY;  Surgeon: Virl Cagey, MD;  Location: [facility];  Service: General;  Laterality: N/A;  . LEFT HEART CATH AND CORONARY ANGIOGRAPHY N/A 06/08/2017   Procedure: LEFT  HEART CATH AND CORONARY ANGIOGRAPHY;  Surgeon: Nelva Bush, MD;  Location: [place] [facility];  Service: Cardiovascular;  Laterality: N/A;  . POLYPECTOMY  07/17/2018   Procedure: POLYPECTOMY;  Surgeon: Danie Binder, MD;  [facility];  Service: Endoscopy;;  colon  . ROTATOR CUFF REPAIR     Right  . SAVORY DILATION N/A 07/17/2018   Procedure: SAVORY DILATION;  Surgeon: Danie Binder, MD;  [facility];  Service: Endoscopy;  Laterality: N/A;   Past Medical History:  Diagnosis Date  . Anemia   .  Anxiety   . Arteriosclerotic cardiovascular disease (ASCVD)    Nonobstructive; cath in 2000: 30-40% mid LAD and proximal RCA;normal EF. stress nuclear in 2006-subtle inferoseptal hypoperfusion with reversibility; negative stress EKG; good exercise tolerance  . Asthma   . Cancer ([place])    skin cancer  . Cataract   . CHF (congestive heart failure) ([place])   . Colon polyps    30 colon polyps found on first colonoscopy  . COPD (chronic obstructive pulmonary disease) ([place])   . Diabetes mellitus without complication ([place])   . Diverticulitis   . DJD (degenerative joint disease)   . GERD (gastroesophageal reflux disease)   . History of kidney stones   . Hyperlipidemia   . Hypertension   . Insomnia   . Obstructive sleep apnea 12/2009   01/26/2010 AHI 83/hr  . Paroxysmal atrial fibrillation ([place]) 10/2004   Onset in 10/2004; recurred 09/2008  . PUD (peptic ulcer disease)    1980s  . RLS (restless legs syndrome)   . Sinusitis    BP 108/75   Pulse 75   Temp 98.2 F (36.8 C)   Ht 5\' 8"  (1.727 m)   Wt 222 lb (100.7 kg)   SpO2 93%   BMI 33.75 kg/m   Opioid Risk Score:   Fall Risk Score:  `1  Depression screen PHQ 2/9  Depression screen [facility] 2/9 06/23/2020 05/01/2020 01/08/2020 06/21/2018 05/11/2017  Decreased Interest 1 3 1 2  0  Down, Depressed, Hopeless 0 1 1 2  0  PHQ - 2 Score 1 4 2 4  0  Altered sleeping 1 1 1 2  0  Tired, decreased energy 1 2 2 2 1   Change in  appetite 0 2 1 2 1   Feeling bad or failure about yourself  0 2 1 0 1  Trouble concentrating 0 2 1 1 1   Moving slowly or fidgety/restless 0 2 2 0 0  Suicidal thoughts 0 0 0 0 0  PHQ-9 Score 3 15 10 11 4   Difficult doing work/chores - Very difficult Somewhat difficult Somewhat difficult Somewhat difficult  Some recent data might be hidden    Review of Systems  Constitutional: Negative.   HENT: Negative.   Eyes: Negative.   Respiratory: Positive for apnea, cough, shortness of breath and wheezing.   Cardiovascular: Negative.   Gastrointestinal: Negative.   Endocrine:       High blood sugars  Genitourinary: Negative.   Musculoskeletal:       Right hip pain  Skin: Negative.   Allergic/Immunologic: Negative.   Neurological: Negative.   Hematological: Bruises/bleeds easily.       Eliquis  Psychiatric/Behavioral: Negative.   All other systems reviewed and are negative.      Objective:   Physical Exam Vitals and nursing note reviewed.  Constitutional:      Appearance: Normal appearance.  HENT:     Head: Normocephalic and atraumatic.  Eyes:     Extraocular Movements: Extraocular movements intact.     Conjunctiva/sclera: Conjunctivae normal.     Pupils: Pupils are equal, round, and reactive to light.  Cardiovascular:     Rate and Rhythm: Normal rate and regular rhythm.     Heart sounds: Normal heart sounds. No murmur heard.   Pulmonary:     Effort: Pulmonary effort is normal. No respiratory distress.     Breath sounds: Normal breath sounds.  Abdominal:     General: Abdomen is flat. Bowel sounds are normal. There is no distension.     Palpations: Abdomen  is soft.  Musculoskeletal:        General: No tenderness.     Cervical back: Normal range of motion and neck supple.     Comments: There is pain and reduced right hip internal rotation passive range of motion.  Fabers negative  Skin:    General: Skin is warm and dry.  Neurological:     Mental Status: He is alert and  oriented to person, place, and time.     Cranial Nerves: No cranial nerve deficit, dysarthria or facial asymmetry.     Sensory: No sensory deficit.     Motor: No weakness, tremor, atrophy or abnormal muscle tone.     Coordination: Coordination is intact.     Deep Tendon Reflexes:     Reflex Scores:      Patellar reflexes are 2+ on the right side and 2+ on the left side.      Achilles reflexes are 1+ on the right side and 1+ on the left side.    Comments: Motor strength: 5/5 bilateral hip flexor knee extensor ankle dorsiflexor and plantar flexor 5/5 bilateral deltoid, bicep, tricep, grip  Sensation intact light touch in the lower extremities Negative straight leg raising test           Assessment & Plan:  1.  Chronic low back and hip pain, appears to be mainly related to right hip joint OA.  Will schedule for fluoroscopic guided right hip injection.  If this is not helpful may need to to sacroiliac versus lumbar facet injections. In terms of medication management, will need to check toxicology and if consistent with reported medications, we can trial low-dose tramadol

## 2020-06-24 ENCOUNTER — Ambulatory Visit: Payer: 59 | Admitting: Physician Assistant

## 2020-06-24 ENCOUNTER — Encounter: Payer: Self-pay | Admitting: Physician Assistant

## 2020-06-24 ENCOUNTER — Other Ambulatory Visit: Payer: Self-pay | Admitting: Physician Assistant

## 2020-06-24 VITALS — BP 124/80 | HR 86 | Temp 98.4°F | Resp 16 | Ht 68.0 in | Wt 224.0 lb

## 2020-06-24 DIAGNOSIS — G2581 Restless legs syndrome: Secondary | ICD-10-CM

## 2020-06-24 MED ORDER — ROPINIROLE HCL 0.25 MG PO TABS: 0.2500 mg | ORAL_TABLET | Freq: Every day | ORAL | 1 refills | Status: DC

## 2020-06-24 NOTE — Progress Notes (Signed)
Patient presents to clinic today to follow-up regarding RLS symptoms.  At last visit patient's iron level was noted to be at the low end of normal.  Was started on a daily iron supplement in hopes this would improve his RLS symptoms.  Patient endorses taking daily ferrous sulfate as directed.  Tolerating well without any issue with constipation.  Has noted some improvement in his RLS symptoms.  Is still taking his Requip 3 mg nightly.  Notes a combination of asthma and typically is keeping symptoms under control but occasionally will have breakthrough symptoms.  This is improved from before when he was still having nighttime breakthrough symptoms despite Requip.  Past Medical History:  Diagnosis Date  . Anemia   . Anxiety   . Arteriosclerotic cardiovascular disease (ASCVD)    Nonobstructive; cath in 2000: 30-40% mid LAD and proximal RCA;normal EF. stress nuclear in 2006-subtle inferoseptal hypoperfusion with reversibility; negative stress EKG; good exercise tolerance  . Asthma   . Cancer ([place])    skin cancer  . Cataract   . CHF (congestive heart failure) ([place])   . Colon polyps    30 colon polyps found on first colonoscopy  . COPD (chronic obstructive pulmonary disease) ([place])   . Diabetes mellitus without complication ([place])   . Diverticulitis   . DJD (degenerative joint disease)   . GERD (gastroesophageal reflux disease)   . History of kidney stones   . Hyperlipidemia   . Hypertension   . Insomnia   . Obstructive sleep apnea 12/2009   01/26/2010 AHI 83/hr  . Paroxysmal atrial fibrillation ([place]) 10/2004   Onset in 10/2004; recurred 09/2008  . PUD (peptic ulcer disease)    1980s  . RLS (restless legs syndrome)   . Sinusitis     Current Outpatient Medications on File Prior to Visit  Medication Sig Dispense Refill  . albuterol (VENTOLIN HFA) 108 (90 Base) MCG/ACT inhaler INHALE 2 PUFFS INTO THE LUNGS EVERY 6 HOURS AS NEEDED FOR WHEEZING OR SHORTNESS OF BREATH 18 g 11  . atorvastatin  (LIPITOR) 80 MG tablet TAKE 1 TABLET(80 MG) BY MOUTH EVERY EVENING 90 tablet 0  . buPROPion (WELLBUTRIN XL) 150 MG 24 hr tablet Take 3 tablets once daily 270 tablet 0  . docusate sodium (COLACE) 100 MG capsule Take 100 mg by mouth as needed for mild constipation.    Marland Kitchen ELIQUIS 5 MG TABS tablet TAKE 1 TABLET(5 MG) BY MOUTH TWICE DAILY 60 tablet 6  . escitalopram (LEXAPRO) 20 MG tablet TAKE 1 TABLET(20 MG) BY MOUTH DAILY 90 tablet 0  . ezetimibe (ZETIA) 10 MG tablet Take 1 tablet (10 mg total) by mouth daily. 90 tablet 3  . ferrous sulfate 325 (65 FE) MG tablet Take 325 mg by mouth daily with breakfast.    . furosemide (LASIX) 20 MG tablet Take 2 tablets (40 mg total) by mouth daily. 90 tablet 2  . lisinopril (ZESTRIL) 2.5 MG tablet TAKE 1 TABLET(2.5 MG) BY MOUTH DAILY 90 tablet 3  . meclizine (ANTIVERT) 25 MG tablet Take 1 tablet (25 mg total) by mouth 3 (three) times daily as needed for dizziness. 30 tablet 0  . metFORMIN (GLUCOPHAGE) 500 MG tablet Take one tablet (500 mg) by mouth twice daily with a meal 180 tablet 0  . metoprolol tartrate (LOPRESSOR) 100 MG tablet Take 1 tablet (100 mg total) by mouth 2 (two) times daily. 180 tablet 3  . nitroGLYCERIN (NITROSTAT) 0.4 MG SL tablet Place 1 tablet (0.4 mg total) under  the tongue every 5 (five) minutes as needed. 25 tablet 3  . omeprazole (PRILOSEC) 40 MG capsule TAKE 1 CAPSULE(40 MG) BY MOUTH DAILY 90 capsule 0  . ondansetron (ZOFRAN) 4 MG tablet Take 1 tablet (4 mg total) by mouth every 8 (eight) hours as needed for nausea or vomiting. 30 tablet 1  . potassium chloride SA (KLOR-CON) 20 MEQ tablet Take 1 tablet (20 mEq total) by mouth daily. 90 tablet 3  . rOPINIRole (REQUIP) 3 MG tablet TAKE 1 TABLET(3 MG) BY MOUTH AT BEDTIME 270 tablet 1  . sildenafil (VIAGRA) 100 MG tablet Take 1 tablet 30 minutes before activity 10 tablet 3  . sodium chloride (OCEAN) 0.65 % SOLN nasal spray Place 1 spray into both nostrils daily as needed for congestion.     .  sucralfate (CARAFATE) 1 g tablet Take one tablet po BID PRN 42 tablet 5  . tetrahydrozoline (VISINE) 0.05 % ophthalmic solution Place 2 drops into both eyes daily as needed (for dry eyes).    . traZODone (DESYREL) 50 MG tablet TAKE 2 TABLETS(100 MG) BY MOUTH AT BEDTIME 60 tablet 5  . triamcinolone cream (KENALOG) 0.1 % Apply 1 application topically 2 (two) times daily. (Patient taking differently: Apply 1 application topically 2 (two) times daily. Pt takes as needed.) 60 g 1   No current facility-administered medications on file prior to visit.    No Known Allergies  Family History  Problem Relation Age of Onset  . Hypertension Mother   . Breast cancer Mother 78       brain/bone   . Heart attack Father   . Diabetes Brother   . Skin cancer Sister 21  . Brain cancer Maternal Uncle   . Cancer Maternal Uncle        NOS  . Breast cancer Cousin        maternal cousin dx <50  . Cancer Cousin   . Colon cancer Neg Hx     Social History   Socioeconomic History  . Marital status: Married    Spouse name: Not on file  . Number of children: 5  . Years of education: Not on file  . Highest education level: Not on file  Occupational History  . Occupation: employed    Fish farm manager: [employer]    Comment: full-time  Tobacco Use  . Smoking status: Current Every Day Smoker    Packs/day: 0.50    Years: 39.00    Pack years: 19.50    Types: Cigarettes    Start date: 07/24/1970  . Smokeless tobacco: Never Used  Vaping Use  . Vaping Use: Never used  Substance and Sexual Activity  . Alcohol use: No    Alcohol/week: 0.0 standard drinks  . Drug use: No  . Sexual activity: Yes    Partners: Female  Other Topics Concern  . Not on file  Social History Narrative  . Not on file   Social Determinants of Health   Financial Resource Strain:   . Difficulty of Paying Living Expenses: Not on file  Food Insecurity:   . Worried About Charity fundraiser in the Last Year: Not on file  . Ran Out of  Food in the Last Year: Not on file  Transportation Needs:   . Lack of Transportation (Medical): Not on file  . Lack of Transportation (Non-Medical): Not on file  Physical Activity:   . Days of Exercise per Week: Not on file  . Minutes of Exercise per Session: Not on  file  Stress:   . Feeling of Stress : Not on file  Social Connections:   . Frequency of Communication with Friends and Family: Not on file  . Frequency of Social Gatherings with Friends and Family: Not on file  . Attends Religious Services: Not on file  . Active Member of Clubs or Organizations: Not on file  . Attends Archivist Meetings: Not on file  . Marital Status: Not on file   Review of Systems - See HPI.  All other ROS are negative.  BP 124/80   Pulse 86   Temp 98.4 F (36.9 C) (Temporal)   Resp 16   Ht 5\' 8"  (1.727 m)   Wt 224 lb (101.6 kg)   SpO2 98%   BMI 34.06 kg/m   Physical Exam Vitals reviewed.  Constitutional:      Appearance: Normal appearance.  HENT:     Head: Normocephalic and atraumatic.  Cardiovascular:     Rate and Rhythm: Normal rate and regular rhythm.     Pulses: Normal pulses.     Heart sounds: Normal heart sounds.  Musculoskeletal:     Cervical back: Neck supple.  Neurological:     General: No focal deficit present.     Mental Status: He is alert and oriented to person, place, and time.  Psychiatric:        Mood and Affect: Mood normal.     Recent Results (from the past 2160 hour(s))  POCT glycosylated hemoglobin (Hb A1C)     Status: Abnormal   Collection Time: 04/28/20 10:11 AM  Result Value Ref Range   Hemoglobin A1C 6.0 (A) 4.0 - 5.6 %   HbA1c POC (<> result, manual entry)     HbA1c, POC (prediabetic range)     HbA1c, POC (controlled diabetic range)    CBC with Differential/Platelet     Status: None   Collection Time: 05/26/20  3:51 PM  Result Value Ref Range   WBC 8.6 4.0 - 10.5 K/uL   RBC 5.00 4.22 - 5.81 Mil/uL   Hemoglobin 14.0 13.0 - 17.0 g/dL    HCT 41.6 39 - 52 %   MCV 83.2 78.0 - 100.0 fl   MCHC 33.6 30.0 - 36.0 g/dL   RDW 14.5 11.5 - 15.5 %   Platelets 168.0 150 - 400 K/uL   Neutrophils Relative % 67.5 43 - 77 %   Lymphocytes Relative 23.1 12 - 46 %   Monocytes Relative 7.0 3 - 12 %   Eosinophils Relative 1.4 0 - 5 %   Basophils Relative 1.0 0 - 3 %   Neutro Abs 5.8 1.4 - 7.7 K/uL   Lymphs Abs 2.0 0.7 - 4.0 K/uL   Monocytes Absolute 0.6 0.1 - 1.0 K/uL   Eosinophils Absolute 0.1 0.0 - 0.7 K/uL   Basophils Absolute 0.1 0.0 - 0.1 K/uL  Comprehensive metabolic panel     Status: None   Collection Time: 05/26/20  3:51 PM  Result Value Ref Range   Sodium 138 135 - 145 mEq/L   Potassium 4.2 3.5 - 5.1 mEq/L   Chloride 104 96 - 112 mEq/L   CO2 24 19 - 32 mEq/L   Glucose, Bld 94 70 - 99 mg/dL   BUN 14 6 - 23 mg/dL   Creatinine, Ser 1.05 0.40 - 1.50 mg/dL   Total Bilirubin 0.4 0.2 - 1.2 mg/dL   Alkaline Phosphatase 76 39 - 117 U/L   AST 19 0 - 37 U/L  ALT 28 0 - 53 U/L   Total Protein 6.9 6.0 - 8.3 g/dL   Albumin 4.4 3.5 - 5.2 g/dL   GFR 71.15 >60.00 mL/min   Calcium 8.9 8.4 - 10.5 mg/dL  Iron     Status: None   Collection Time: 05/26/20  3:51 PM  Result Value Ref Range   Iron 61 42 - 165 ug/dL    Assessment/Plan: 1. RLS (restless legs syndrome) Improved with addition of daily iron supplement.  Continue current dose of ferrous sulfate.  Given occasional breakthrough symptoms we will add on 0.25 mg Requip 3 milligrams nightly to be used on a as needed basis.  This visit occurred during the SARS-CoV-2 public health emergency.  Safety protocols were in place, including screening questions prior to the visit, additional usage of staff PPE, and extensive cleaning of exam room while observing appropriate contact time as indicated for disinfecting solutions.    Leeanne Rio, PA-C

## 2020-06-24 NOTE — Patient Instructions (Signed)
I am glad things are improving. Please continue the daily iron supplement.  Continue current dose of Requip (3 mg) at night. I have sent in an additional prescription for 0.25 mg to use in addition at night when you have breakthrough symptoms.  Keep working on smoking cessation as I am hoping this will also help with your RLS symptoms a bit.   Message or call me in 4 weeks to let me know how thing are going.  Hang in there!

## 2020-06-26 LAB — DRUG TOX MONITOR 1 W/CONF, ORAL FLD
Amphetamines: NEGATIVE ng/mL (ref <10)
Barbiturates: NEGATIVE ng/mL (ref <10)
Benzodiazepines: NEGATIVE ng/mL (ref <0.50)
Buprenorphine: NEGATIVE ng/mL (ref <0.10)
Cocaine: NEGATIVE ng/mL (ref <5.0)
Cotinine: 202.1 ng/mL — ABNORMAL HIGH (ref <5.0)
Fentanyl: NEGATIVE ng/mL (ref <0.10)
Heroin Metabolite: NEGATIVE ng/mL (ref <1.0)
MARIJUANA: NEGATIVE ng/mL (ref <2.5)
MDMA: NEGATIVE ng/mL (ref <10)
Meprobamate: NEGATIVE ng/mL (ref <2.5)
Methadone: NEGATIVE ng/mL (ref <5.0)
Nicotine Metabolite: POSITIVE ng/mL — AB (ref <5.0)
Opiates: NEGATIVE ng/mL (ref <2.5)
Phencyclidine: NEGATIVE ng/mL (ref <10)
Tapentadol: NEGATIVE ng/mL (ref <5.0)
Tramadol: NEGATIVE ng/mL (ref <5.0)
Zolpidem: NEGATIVE ng/mL (ref <5.0)

## 2020-06-26 LAB — DRUG TOX ALC METAB W/CON, ORAL FLD: Alcohol Metabolite: NEGATIVE ng/mL (ref <25)

## 2020-06-29 ENCOUNTER — Telehealth: Payer: Self-pay | Admitting: *Deleted

## 2020-06-29 MED ORDER — TRAMADOL HCL 50 MG PO TABS: 50.0000 mg | ORAL_TABLET | Freq: Two times a day (BID) | ORAL | 0 refills | Status: DC | PRN

## 2020-06-29 NOTE — Telephone Encounter (Addendum)
Oral swab drug screen is consistent for having no controlled medications present. I have notified him and will call in the tramadol. ----- Message from Charlett Blake, MD sent at 06/29/2020  1:24 PM EDT ----- If pt calls back requesting pain meds may rx tramadol 50mg  BID

## 2020-07-01 NOTE — Progress Notes (Signed)
Referring Provider: Mikey Kirschner, MD Primary Care Physician:  Brunetta Jeans, PA-C Primary GI: Dr. Abbey Chatters  Chief Complaint  Patient presents with   Gastroesophageal Reflux    doing ok   Anemia    HPI:   Shaun [name] is a 64 y.o. male presenting today with a history of chronic microcytic anemia, with labs consistent with IDA. EGD/TCS 06/2018: low-grade narrowing Schatzki ring which was dilated. Gastritis. 32 polyps removed from the colon, 10 were tubular adenomas. Next colonoscopy 3 years (2022). Capsule study to complete GI work-up late 2020 was normal. On chronic Eliquis. Chronic GERD  Vomiting at last appt, felt to be dealing with delayed gastric emptying in setting of diabetes and chronic narcotics. He wanted to treat conservatively. On ferrous sulfate once daily per PCP. Omeprazole once daily.   Symptoms improved since last seen. He has tweaked his diet. Trying to choose healthier foods. No dysphagia. Vomiting every 4-6 weeks. Unable to pinpoint triggers. Majority of time in the morning. Undigested food. No dysphagia. No abdominal pain. Trying to watch what he eats. Not eating late at night. Blood sugars running well, better than it used to be. Rare reflux. No overt GI bleeding.   Past Medical History:  Diagnosis Date   Anemia    Anxiety    Arteriosclerotic cardiovascular disease (ASCVD)    Nonobstructive; cath in 2000: 30-40% mid LAD and proximal RCA;normal EF. stress nuclear in 2006-subtle inferoseptal hypoperfusion with reversibility; negative stress EKG; good exercise tolerance   Asthma    Cancer ([facility])    skin cancer   Cataract    CHF (congestive heart failure) ([facility])    Colon polyps    30 colon polyps found on first colonoscopy   COPD (chronic obstructive pulmonary disease) ([facility])    Diabetes mellitus without complication ([facility])    Diverticulitis    DJD (degenerative joint disease)    GERD (gastroesophageal reflux disease)     History of kidney stones    Hyperlipidemia    Hypertension    Insomnia    Obstructive sleep apnea 12/2009   01/26/2010 AHI 83/hr   Paroxysmal atrial fibrillation ([place]) 10/2004   Onset in 10/2004; recurred 09/2008   PUD (peptic ulcer disease)    1980s   RLS (restless legs syndrome)    Sinusitis     Past Surgical History:  Procedure Laterality Date   BIOPSY  07/17/2018   Procedure: BIOPSY;  Surgeon: Danie Binder, MD;  [facility];  Service: Endoscopy;;  colon   BOWEL RESECTION  09/17/2018   SMALL BOWEL RESECTION: 71 CM    CARDIAC CATHETERIZATION N/A 07/21/2015   Procedure: Left Heart Cath and Coronary Angiography;  Surgeon: Peter M Martinique, MD;  Location: [facility];  Service: Cardiovascular;  Laterality: N/A;   CARDIAC CATHETERIZATION N/A 07/21/2015   Procedure: Coronary Stent Intervention;  Surgeon: Peter M Martinique, MD;  Location: [facility];  Service: Cardiovascular;  Laterality: N/A;   CIRCUMCISION N/A 04/05/2019   Procedure: CIRCUMCISION ADULT;  Surgeon: Irine Seal, MD;  Location: [facility];  Service: Urology;  Laterality: N/A;   COLONOSCOPY N/A 05/19/2014   Dr. Barnie Alderman diverticulosis/moderate external hemorrhoids, >20 simple adenomas. Genetic screening negative.    COLONOSCOPY WITH PROPOFOL N/A 07/17/2018   Dr. Oneida Alar: Diverticulosis, external/internal hemorrhoids, 32 colon polyps removed.  ten tubular adenomas removed with no high-grade dysplasia.  Advised to have surveillance colonoscopy in 3 years.   ESOPHAGOGASTRODUODENOSCOPY (EGD) WITH PROPOFOL  N/A 07/17/2018   Dr. Oneida Alar: Low-grade narrowing Schatzki ring at the GE junction status post dilation.  Gastritis.  Biopsy with mild nonspecific reactive gastropathy.  No H. pylori.   GIVENS CAPSULE STUDY N/A 06/24/2019   normal   HERNIA REPAIR  1986   Left inguinal   INTRAVASCULAR PRESSURE WIRE/FFR STUDY Left 06/08/2017   Procedure: INTRAVASCULAR PRESSURE WIRE/FFR STUDY;  Surgeon: Nelva Bush, MD;  Location: [facility];  Service: Cardiovascular;  Laterality: Left;  LAD and CFX   LAPAROTOMY N/A 09/17/2018   Procedure: EXPLORATORY LAPAROTOMY;  Surgeon: Virl Cagey, MD;  Location: [facility];  Service: General;  Laterality: N/A;   LEFT HEART CATH AND CORONARY ANGIOGRAPHY N/A 06/08/2017   Procedure: LEFT HEART CATH AND CORONARY ANGIOGRAPHY;  Surgeon: Nelva Bush, MD;  Location: [facility];  Service: Cardiovascular;  Laterality: N/A;   POLYPECTOMY  07/17/2018   Procedure: POLYPECTOMY;  Surgeon: Danie Binder, MD;  [facility];  Service: Endoscopy;;  colon   ROTATOR CUFF REPAIR     Right   SAVORY DILATION N/A 07/17/2018   Procedure: SAVORY DILATION;  Surgeon: Danie Binder, MD;  [facility];  Service: Endoscopy;  Laterality: N/A;    Current Outpatient Medications  Medication Sig Dispense Refill   albuterol (VENTOLIN HFA) 108 (90 Base) MCG/ACT inhaler INHALE 2 PUFFS INTO THE LUNGS EVERY 6 HOURS AS NEEDED FOR WHEEZING OR SHORTNESS OF BREATH 18 g 11   atorvastatin (LIPITOR) 80 MG tablet TAKE 1 TABLET(80 MG) BY MOUTH EVERY EVENING 90 tablet 0   buPROPion (WELLBUTRIN XL) 150 MG 24 hr tablet Take 3 tablets once daily 270 tablet 0   docusate sodium (COLACE) 100 MG capsule Take 100 mg by mouth as needed for mild constipation.     ELIQUIS 5 MG TABS tablet TAKE 1 TABLET(5 MG) BY MOUTH TWICE DAILY 60 tablet 6   escitalopram (LEXAPRO) 20 MG tablet TAKE 1 TABLET(20 MG) BY MOUTH DAILY 90 tablet 0   ezetimibe (ZETIA) 10 MG tablet Take 1 tablet (10 mg total) by mouth daily. 90 tablet 3   ferrous sulfate 325 (65 FE) MG tablet Take 325 mg by mouth daily with breakfast.     furosemide (LASIX) 20 MG tablet Take 2 tablets (40 mg total) by mouth daily. 90 tablet 2   lisinopril (ZESTRIL) 2.5 MG tablet TAKE 1 TABLET(2.5 MG) BY MOUTH DAILY 90 tablet 3   meclizine (ANTIVERT) 25 MG tablet Take 1 tablet (25 mg total) by mouth 3 (three)  times daily as needed for dizziness. 30 tablet 0   metFORMIN (GLUCOPHAGE) 500 MG tablet Take one tablet (500 mg) by mouth twice daily with a meal 180 tablet 0   metoprolol tartrate (LOPRESSOR) 100 MG tablet Take 1 tablet (100 mg total) by mouth 2 (two) times daily. 180 tablet 3   nitroGLYCERIN (NITROSTAT) 0.4 MG SL tablet Place 1 tablet (0.4 mg total) under the tongue every 5 (five) minutes as needed. 25 tablet 3   omeprazole (PRILOSEC) 40 MG capsule TAKE 1 CAPSULE(40 MG) BY MOUTH DAILY 90 capsule 0   ondansetron (ZOFRAN) 4 MG tablet Take 1 tablet (4 mg total) by mouth every 8 (eight) hours as needed for nausea or vomiting. 30 tablet 1   potassium chloride SA (KLOR-CON) 20 MEQ tablet Take 1 tablet (20 mEq total) by mouth daily. 90 tablet 3   rOPINIRole (REQUIP) 0.25 MG tablet Take 1 tablet (0.25 mg total) by mouth at bedtime. When  needed for bad nights -- to use with 3 mg dose 30 tablet 1   rOPINIRole (REQUIP) 3 MG tablet TAKE 1 TABLET(3 MG) BY MOUTH AT BEDTIME 270 tablet 1   sildenafil (VIAGRA) 100 MG tablet Take 1 tablet 30 minutes before activity 10 tablet 3   sodium chloride (OCEAN) 0.65 % SOLN nasal spray Place 1 spray into both nostrils daily as needed for congestion.      sucralfate (CARAFATE) 1 g tablet Take one tablet po BID PRN 42 tablet 5   tetrahydrozoline (VISINE) 0.05 % ophthalmic solution Place 2 drops into both eyes daily as needed (for dry eyes).     traMADol (ULTRAM) 50 MG tablet Take 1 tablet (50 mg total) by mouth 2 (two) times daily as needed for moderate pain. 60 tablet 0   traZODone (DESYREL) 50 MG tablet TAKE 2 TABLETS(100 MG) BY MOUTH AT BEDTIME 60 tablet 5   triamcinolone cream (KENALOG) 0.1 % Apply 1 application topically 2 (two) times daily. (Patient taking differently: Apply 1 application topically 2 (two) times daily. Pt takes as needed.) 60 g 1   No current facility-administered medications for this visit.    Allergies as of 07/02/2020   (No Known  Allergies)    Family History  Problem Relation Age of Onset   Hypertension Mother    Breast cancer Mother 69       brain/bone    Heart attack Father    Diabetes Brother    Skin cancer Sister 65   Brain cancer Maternal Uncle    Cancer Maternal Uncle        NOS   Breast cancer Cousin        maternal cousin dx <50   Cancer Cousin    Colon cancer Neg Hx     Social History   Socioeconomic History   Marital status: Married    Spouse name: Not on file   Number of children: 5   Years of education: Not on file   Highest education level: Not on file  Occupational History   Occupation: employed    Employer: [employer]: full-time  Tobacco Use   Smoking status: Current Every Day Smoker    Packs/day: 0.50    Years: 39.00    Pack years: 19.50    Types: Cigarettes    Start date: 07/24/1970   Smokeless tobacco: Never Used  Vaping Use   Vaping Use: Never used  Substance and Sexual Activity   Alcohol use: No    Alcohol/week: 0.0 standard drinks   Drug use: No   Sexual activity: Yes    Partners: Female  Other Topics Concern   Not on file  Social History Narrative   Not on file   Social Determinants of Health   Financial Resource Strain:    Difficulty of Paying Living Expenses: Not on file  Food Insecurity:    Worried About Charity fundraiser in the Last Year: Not on file   [employer] of Food in the Last Year: Not on file  Transportation [name]:    Lack of Transportation (Medical): Not on file   Lack of Transportation (Non-Medical): Not on file  Physical Activity:    Days of Exercise per Week: Not on file   Minutes of Exercise per Session: Not on file  Stress:    Feeling of Stress : Not on file  Social Connections:    Frequency of Communication with Friends and Family: Not on file  Frequency of Social Gatherings with Friends and Family: Not on file   Attends Religious Services: Not on file   Active Member of Clubs or  Organizations: Not on file   Attends Archivist Meetings: Not on file   Marital Status: Not on file    Review of Systems: Gen: Denies fever, chills, anorexia. Denies fatigue, weakness, weight loss.  CV: Denies chest pain, palpitations, syncope, peripheral edema, and claudication. Resp: Denies dyspnea at rest, cough, wheezing, coughing up blood, and pleurisy. GI: see HPI Derm: Denies rash, itching, dry skin Psych: Denies depression, anxiety, memory loss, confusion. No homicidal or suicidal ideation.  Heme: Denies bruising, bleeding, and enlarged lymph nodes.  Physical Exam: BP 131/83    Pulse 85    Temp (!) 96.9 F (36.1 C) (Temporal)    Ht 5\' 8"  (1.727 m)    Wt 218 lb 12.8 oz (99.2 kg)    BMI 33.27 kg/m  General:   Alert and oriented. No distress noted. Pleasant and cooperative.  Head:  Normocephalic and atraumatic. Eyes:  Conjuctiva clear without scleral icterus. Mouth:  Mask in place Abdomen:  +BS, soft, non-tender and non-distended. No rebound or guarding. No HSM or masses noted. Query ventral/umbilical hernia Msk:  Symmetrical without gross deformities. Normal posture. Extremities:  Without edema. Neurologic:  Alert and  oriented x4 Psych:  Alert and cooperative. Normal mood and affect.  ASSESSMENT: DEVINN HURWITZ is a 64 y.o. male presenting today with a history of chronic microcytic anemia, with labs consistent with IDA and improved. EGD/TCS and capsule study on file. Due to multiple polyps (32), his next colonoscopy will be in 2022.   Chronic GERD controlled with omeprazole once daily. He had noted N/V at last appointment and was felt to likely have delayed gastric emptying in setting of diabetes and chronic narcotics. This has improved since last visit, happening only every 4-6 weeks. No dysphagia, abdominal pain, or overt GI bleeding. As EGD fairly recent, we have held off on repeat EGD unless alarm signs/symptoms. He has modified his diet as well.   Anemia:  Hgb 14 recently from PCP. Iron low normal but improved. No ferritin on file. Started on ferrous sulfate per PCP. He is being followed closely with them.   PLAN:  Continue omeprazole daily  Call if any worsening or persistent symptoms  Colonoscopy 2022  Return 6 months  Shaun Needs, PhD, [facility]

## 2020-07-02 ENCOUNTER — Encounter: Payer: Self-pay | Admitting: Gastroenterology

## 2020-07-02 ENCOUNTER — Other Ambulatory Visit: Payer: Self-pay

## 2020-07-02 ENCOUNTER — Ambulatory Visit: Payer: 59 | Admitting: Gastroenterology

## 2020-07-02 VITALS — BP 131/83 | HR 85 | Temp 96.9°F | Ht 68.0 in | Wt 218.8 lb

## 2020-07-02 DIAGNOSIS — R112 Nausea with vomiting, unspecified: Secondary | ICD-10-CM

## 2020-07-02 DIAGNOSIS — K219 Gastro-esophageal reflux disease without esophagitis: Secondary | ICD-10-CM | POA: Diagnosis not present

## 2020-07-02 NOTE — Patient Instructions (Signed)
Please call if you have frequent vomiting, nausea, any abdominal pain, problems swallowing.  I recommend avoiding eating before laying down, wait at least 2-3 hours before laying down. Continue omeprazole as you are doing.  We will see you in 6 months or sooner if needed!  I enjoyed seeing you again today! As you know, I value our relationship and want to provide genuine, compassionate, and quality care. I welcome your feedback. If you receive a survey regarding your visit,  I greatly appreciate you taking time to fill this out. See you next time!  Annitta Needs, PhD, ANP-BC [facility]

## 2020-07-09 MED FILL — traZODone HCL 50 MG TABS: 50 | 30 days supply | Qty: 60 | Fill #3

## 2020-07-16 ENCOUNTER — Other Ambulatory Visit: Payer: Self-pay

## 2020-07-16 ENCOUNTER — Encounter: Payer: Self-pay | Admitting: Physical Medicine & Rehabilitation

## 2020-07-16 ENCOUNTER — Encounter: Payer: 59 | Attending: Physical Medicine & Rehabilitation | Admitting: Physical Medicine & Rehabilitation

## 2020-07-16 VITALS — BP 113/80 | HR 78 | Temp 98.6°F | Ht 68.0 in | Wt 218.0 lb

## 2020-07-16 DIAGNOSIS — M1611 Unilateral primary osteoarthritis, right hip: Secondary | ICD-10-CM | POA: Insufficient documentation

## 2020-07-16 MED ORDER — TRAMADOL HCL 50 MG PO TABS: 50.0000 mg | ORAL_TABLET | Freq: Two times a day (BID) | ORAL | 0 refills | Status: DC | PRN

## 2020-07-16 MED FILL — SILDENAFIL CITRATE 100 MG T: 100 | 50 days supply | Qty: 10 | Fill #2

## 2020-07-16 NOTE — Progress Notes (Signed)
RIght hip intra-articular injection under fluoro guidance  Indication osteoarthritis unresponsive to conservative care including exercise and oral medications  Informed consent was obtained after describing risks and benefits of the procedure, this includes bleeding bruising and infection. The patient elected to proceed and has given written consent  Placed supine on fluoroscopy table.  Betadine prep to groin area.  Imaging to identify the intertrochanteric line at the base of the neck of the femur. 5 cc of 1% lidocaine were infiltrated into the skin and subcu tissue using 25-gauge 1.5 inch needle.  Then a 22-gauge 5" needle was inserted under fluoroscopic guidance targeting the junction of the  Right  femoral head and femoral neck area.  Bone contact was made.  Isovue 200 times a total of 3 cc were injected needle was adjusted to achieve intra-articular location. Then a solution containing 1 cc of 6 mg/cc Celestone and 4 cc of 1% lidocaine were injected.  Patient tolerated procedure well post procedure instructions given

## 2020-07-16 NOTE — Progress Notes (Signed)
  [facility]   Name: Shaun [name] DOB:10/28/1955 MRN: [id number]  Date:07/16/2020  Physician: Alysia Penna, MD    Nurse/CMA: Truman Hayward, CMA  Allergies: No Known Allergies  Consent Signed: Yes.    Is patient diabetic? Yes.    CBG today? 38  Pregnant: No. LMP: No LMP for male patient. (age 64-55)  Anticoagulants: yes (Eliquis) Anti-inflammatory: no Antibiotics: no  Procedure: Right hip injection  Position: Supine Start Time: 3:19pm  End Time: 3:28pm Fluoro Time: 31  RN/CMA Truman Hayward, CMA Jaion Lagrange, CMA    Time 3:04pm 3:30pm    BP 113/80 127/83    Pulse 78 86    Respirations 16 16    O2 Sat 94 95    S/S 6 6    Pain Level 4/10 2/10     D/C home with no one, patient A & O X 3, D/C instructions reviewed, and sits independently.

## 2020-07-16 NOTE — Patient Instructions (Signed)
We injected the right hip with a solution containing lidocaine and Celestone.  This should help with pain related to osteoarthritis of the hip.  I would like to see him back in 6 weeks to see how this did for you.  In the meantime please continue your tramadol 50 mg twice a day.

## 2020-07-27 MED FILL — EZETIMIBE 10 MG TABS: 10 | 90 days supply | Qty: 90 | Fill #0

## 2020-07-27 MED FILL — FUROSEMIDE 20 MG TABS: 20 | 45 days supply | Qty: 90 | Fill #0

## 2020-07-30 ENCOUNTER — Ambulatory Visit: Payer: 59 | Attending: Internal Medicine

## 2020-07-30 DIAGNOSIS — Z23 Encounter for immunization: Secondary | ICD-10-CM

## 2020-07-30 NOTE — Progress Notes (Signed)
   Covid-19 Vaccination Clinic  Name:  Shaun [name]    MRN: [id number] DOB: 06-28-1956  07/30/2020  Shaun [name] was observed post Covid-19 immunization for 15 minutes without incident. He was provided with Vaccine Information Sheet and instruction to access the V-Safe system.   Shaun [name] was instructed to call 911 with any severe reactions post vaccine: Marland Kitchen Difficulty breathing  . Swelling of face and throat  . A fast heartbeat  . A bad rash all over body  . Dizziness and weakness   Immunizations Administered    Name Date Dose VIS Date Route   Pfizer COVID-19 Vaccine 07/30/2020  3:04 PM 0.3 mL 06/17/2020 Intramuscular   Manufacturer: [place]   Lot: [id number]   NDC: [address]-0

## 2020-08-03 ENCOUNTER — Other Ambulatory Visit: Payer: Self-pay | Admitting: Physical Medicine & Rehabilitation

## 2020-08-03 ENCOUNTER — Telehealth: Payer: Self-pay | Admitting: *Deleted

## 2020-08-03 MED ORDER — TRAMADOL HCL 50 MG PO TABS: 50.0000 mg | ORAL_TABLET | Freq: Two times a day (BID) | ORAL | 0 refills | Status: DC | PRN

## 2020-08-03 MED FILL — rOPINIRole HCL 0.25 MG TABS: 0.25 | 30 days supply | Qty: 30 | Fill #0

## 2020-08-03 MED FILL — traMADol HCL 50 MG TABS: 50 | 30 days supply | Qty: 60 | Fill #0

## 2020-08-03 NOTE — Telephone Encounter (Signed)
Shaun [name] called and asked for a refill on his tramadol.  Per PMP last refill was 06/30/20.  There was a refill sent in on 07/16/20 but it was sent to [facility] and he wants to use [facility] only.  I have called the refill to the requested pharmacy.

## 2020-08-11 ENCOUNTER — Telehealth: Payer: Self-pay | Admitting: Physician Assistant

## 2020-08-11 ENCOUNTER — Other Ambulatory Visit: Payer: Self-pay | Admitting: Physician Assistant

## 2020-08-11 DIAGNOSIS — F5101 Primary insomnia: Secondary | ICD-10-CM

## 2020-08-11 MED ORDER — TRAZODONE HCL 50 MG PO TABS: ORAL_TABLET | ORAL | 1 refills | Status: DC

## 2020-08-11 MED FILL — traZODone HCL 50 MG TABS: 50 | 90 days supply | Qty: 180 | Fill #0

## 2020-08-11 NOTE — Telephone Encounter (Signed)
Rx sent to the preferred patient pharmacy  

## 2020-08-11 NOTE — Telephone Encounter (Signed)
Pt called in asking for a new script to be sent to the [facility] pharmacy of the Trazodone 50mg  tabs.  Please advise

## 2020-08-17 DIAGNOSIS — M7061 Trochanteric bursitis, right hip: Secondary | ICD-10-CM | POA: Diagnosis not present

## 2020-08-17 DIAGNOSIS — M1611 Unilateral primary osteoarthritis, right hip: Secondary | ICD-10-CM | POA: Diagnosis not present

## 2020-08-19 DIAGNOSIS — M7061 Trochanteric bursitis, right hip: Secondary | ICD-10-CM | POA: Diagnosis not present

## 2020-08-19 DIAGNOSIS — R2689 Other abnormalities of gait and mobility: Secondary | ICD-10-CM | POA: Diagnosis not present

## 2020-08-19 DIAGNOSIS — M6281 Muscle weakness (generalized): Secondary | ICD-10-CM | POA: Diagnosis not present

## 2020-08-19 DIAGNOSIS — R52 Pain, unspecified: Secondary | ICD-10-CM | POA: Diagnosis not present

## 2020-08-24 DIAGNOSIS — R2689 Other abnormalities of gait and mobility: Secondary | ICD-10-CM | POA: Diagnosis not present

## 2020-08-24 DIAGNOSIS — R52 Pain, unspecified: Secondary | ICD-10-CM | POA: Diagnosis not present

## 2020-08-24 DIAGNOSIS — M7061 Trochanteric bursitis, right hip: Secondary | ICD-10-CM | POA: Diagnosis not present

## 2020-08-24 DIAGNOSIS — M6281 Muscle weakness (generalized): Secondary | ICD-10-CM | POA: Diagnosis not present

## 2020-08-25 ENCOUNTER — Telehealth: Payer: Self-pay | Admitting: Cardiology

## 2020-08-25 ENCOUNTER — Other Ambulatory Visit: Payer: Self-pay | Admitting: Cardiology

## 2020-08-25 NOTE — Telephone Encounter (Signed)
Follow Up:     Please call concerning pt's Sildenafil prescription.

## 2020-08-26 ENCOUNTER — Other Ambulatory Visit (HOSPITAL_COMMUNITY): Payer: Self-pay | Admitting: Cardiology

## 2020-08-26 MED FILL — SILDENAFIL CITRATE 100 MG T: 100 | 30 days supply | Qty: 6 | Fill #0

## 2020-08-26 NOTE — Telephone Encounter (Signed)
Spoke with  out pt pharmacy about the pt refill request.

## 2020-08-27 ENCOUNTER — Encounter: Payer: 59 | Admitting: Physical Medicine & Rehabilitation

## 2020-08-27 DIAGNOSIS — M6281 Muscle weakness (generalized): Secondary | ICD-10-CM | POA: Diagnosis not present

## 2020-08-27 DIAGNOSIS — M7061 Trochanteric bursitis, right hip: Secondary | ICD-10-CM | POA: Diagnosis not present

## 2020-08-27 DIAGNOSIS — R2689 Other abnormalities of gait and mobility: Secondary | ICD-10-CM | POA: Diagnosis not present

## 2020-08-27 DIAGNOSIS — R52 Pain, unspecified: Secondary | ICD-10-CM | POA: Diagnosis not present

## 2020-09-03 DIAGNOSIS — R2689 Other abnormalities of gait and mobility: Secondary | ICD-10-CM | POA: Diagnosis not present

## 2020-09-03 DIAGNOSIS — M6281 Muscle weakness (generalized): Secondary | ICD-10-CM | POA: Diagnosis not present

## 2020-09-03 DIAGNOSIS — R52 Pain, unspecified: Secondary | ICD-10-CM | POA: Diagnosis not present

## 2020-09-03 DIAGNOSIS — M7061 Trochanteric bursitis, right hip: Secondary | ICD-10-CM | POA: Diagnosis not present

## 2020-09-09 ENCOUNTER — Other Ambulatory Visit: Payer: Self-pay | Admitting: Physical Medicine & Rehabilitation

## 2020-09-09 MED FILL — traMADol HCL 50 MG TABS: 50 | 30 days supply | Qty: 60 | Fill #0

## 2020-09-10 MED FILL — FUROSEMIDE 20 MG TABS: 20 | 15 days supply | Qty: 30 | Fill #0

## 2020-09-11 ENCOUNTER — Encounter: Payer: 59 | Admitting: Physical Medicine & Rehabilitation

## 2020-09-16 MED FILL — rOPINIRole HCL 3 MG TABS: 3 | 90 days supply | Qty: 90 | Fill #2

## 2020-09-24 MED FILL — SILDENAFIL CITRATE 100 MG T: 100 | 30 days supply | Qty: 6 | Fill #1

## 2020-09-29 ENCOUNTER — Encounter: Payer: 59 | Attending: Physical Medicine & Rehabilitation | Admitting: Physical Medicine & Rehabilitation

## 2020-09-29 DIAGNOSIS — M1611 Unilateral primary osteoarthritis, right hip: Secondary | ICD-10-CM | POA: Insufficient documentation

## 2020-10-02 DIAGNOSIS — R2689 Other abnormalities of gait and mobility: Secondary | ICD-10-CM | POA: Diagnosis not present

## 2020-10-02 DIAGNOSIS — R52 Pain, unspecified: Secondary | ICD-10-CM | POA: Diagnosis not present

## 2020-10-02 DIAGNOSIS — M7061 Trochanteric bursitis, right hip: Secondary | ICD-10-CM | POA: Diagnosis not present

## 2020-10-02 DIAGNOSIS — M6281 Muscle weakness (generalized): Secondary | ICD-10-CM | POA: Diagnosis not present

## 2020-10-06 ENCOUNTER — Encounter: Payer: Self-pay | Admitting: *Deleted

## 2020-10-06 DIAGNOSIS — M1611 Unilateral primary osteoarthritis, right hip: Secondary | ICD-10-CM | POA: Diagnosis not present

## 2020-10-07 DIAGNOSIS — M7061 Trochanteric bursitis, right hip: Secondary | ICD-10-CM | POA: Diagnosis not present

## 2020-10-07 DIAGNOSIS — M6281 Muscle weakness (generalized): Secondary | ICD-10-CM | POA: Diagnosis not present

## 2020-10-07 DIAGNOSIS — R52 Pain, unspecified: Secondary | ICD-10-CM | POA: Diagnosis not present

## 2020-10-07 DIAGNOSIS — R2689 Other abnormalities of gait and mobility: Secondary | ICD-10-CM | POA: Diagnosis not present

## 2020-10-08 ENCOUNTER — Other Ambulatory Visit: Payer: Self-pay | Admitting: Cardiology

## 2020-10-08 ENCOUNTER — Other Ambulatory Visit: Payer: Self-pay | Admitting: Physical Medicine & Rehabilitation

## 2020-10-08 DIAGNOSIS — I5032 Chronic diastolic (congestive) heart failure: Secondary | ICD-10-CM

## 2020-10-08 MED FILL — FUROSEMIDE 20 MG TABS: 20 | 15 days supply | Qty: 30 | Fill #0

## 2020-10-08 MED FILL — traMADol HCL 50 MG TABS: 50 | 30 days supply | Qty: 60 | Fill #0

## 2020-10-13 ENCOUNTER — Other Ambulatory Visit: Payer: Self-pay

## 2020-10-13 ENCOUNTER — Encounter: Payer: Self-pay | Admitting: Physical Medicine & Rehabilitation

## 2020-10-13 ENCOUNTER — Encounter: Payer: 59 | Admitting: Physical Medicine & Rehabilitation

## 2020-10-13 VITALS — BP 143/93 | HR 87 | Temp 98.3°F | Ht 68.0 in | Wt 217.0 lb

## 2020-10-13 DIAGNOSIS — M1611 Unilateral primary osteoarthritis, right hip: Secondary | ICD-10-CM | POA: Diagnosis not present

## 2020-10-13 MED ORDER — TRAMADOL HCL 50 MG PO TABS: 50.0000 mg | ORAL_TABLET | Freq: Two times a day (BID) | ORAL | 5 refills | Status: DC

## 2020-10-13 NOTE — Patient Instructions (Signed)
Please call if you need [place] to repeat Right hip injection

## 2020-10-13 NOTE — Progress Notes (Signed)
Subjective:    Patient ID: Shaun [name], male    DOB: 03-09-1956, 65 y.o.   MRN: [id number]  HPI 65 year old male with history of chronic hip and low back pain.  Patient returns for follow-up visit.  He felt the right hip intra-articular injection under fluoroscopic guidance was helpful in alleviating his right-sided groin pain.  A previous bursa injection performed at orthopedic office was not helpful.  He is no longer going to the orthopedics office. He is doing well on his tramadol 50 mg twice daily Side effects noted. Patient is approximately 3 months post right hip intra-articular injection under fluoroscopic guidance and feels it is still effective although possibly starting to wear off. Pain Inventory Average Pain 4 Pain Right Now 5 My pain is stabbing  In the last 24 hours, has pain interfered with the following? General activity 3 Relation with others 3 Enjoyment of life 3 What TIME of day is your pain at its worst? morning  and daytime Sleep (in general) Fair  Pain is worse with: walking, bending and standing Pain improves with: pacing activities and medication Relief from Meds: 5  Family History  Problem Relation Age of Onset  . Hypertension Mother   . Breast cancer Mother 8       brain/bone   . Heart attack Father   . Diabetes Brother   . Skin cancer Sister 76  . Brain cancer Maternal Uncle   . Cancer Maternal Uncle        NOS  . Breast cancer Cousin        maternal cousin dx <50  . Cancer Cousin   . Colon cancer Neg Hx    Social History   Socioeconomic History  . Marital status: Married    Spouse name: Not on file  . Number of children: 5  . Years of education: Not on file  . Highest education level: Not on file  Occupational History  . Occupation: employed    Fish farm manager: [employer], [employer]    Comment: full-time  Tobacco Use  . Smoking status: Current Every Day Smoker    Packs/day: 0.50    Years: 39.00    Pack years: 19.50    Types: Cigarettes     Start date: 07/24/1970  . Smokeless tobacco: Never Used  Vaping Use  . Vaping Use: Never used  Substance and Sexual Activity  . Alcohol use: No    Alcohol/week: 0.0 standard drinks  . Drug use: No  . Sexual activity: Yes    Partners: Female  Other Topics Concern  . Not on file  Social History Narrative  . Not on file   Social Determinants of Health   Financial Resource Strain: Not on file  Food Insecurity: Not on file  Transportation Needs: Not on file  Physical Activity: Not on file  Stress: Not on file  Social Connections: Not on file   Past Surgical History:  Procedure Laterality Date  . BIOPSY  07/17/2018   Procedure: BIOPSY;  Surgeon: Danie Binder, MD;  [facility];  Service: Endoscopy;;  colon  . BOWEL RESECTION  09/17/2018   SMALL BOWEL RESECTION: 71 CM   . CARDIAC CATHETERIZATION N/A 07/21/2015   Procedure: Left Heart Cath and Coronary Angiography;  Surgeon: Peter M Martinique, MD;  Location: [facility];  Service: Cardiovascular;  Laterality: N/A;  . CARDIAC CATHETERIZATION N/A 07/21/2015   Procedure: Coronary Stent Intervention;  Surgeon: Peter M Martinique, MD;  Location: [facility];  Service: Cardiovascular;  Laterality: N/A;  . CIRCUMCISION N/A 04/05/2019   Procedure: CIRCUMCISION ADULT;  Surgeon: Irine Seal, MD;  Location: [facility];  Service: Urology;  Laterality: N/A;  . COLONOSCOPY N/A 05/19/2014   Dr. Barnie Alderman diverticulosis/moderate external hemorrhoids, >20 simple adenomas. Genetic screening negative.   . COLONOSCOPY WITH PROPOFOL N/A 07/17/2018   Dr. Oneida Alar: Diverticulosis, external/internal hemorrhoids, 32 colon polyps removed.  ten tubular adenomas removed with no high-grade dysplasia.  Advised to have surveillance colonoscopy in 3 years.  . ESOPHAGOGASTRODUODENOSCOPY (EGD) WITH PROPOFOL N/A 07/17/2018   Dr. Oneida Alar: Low-grade narrowing Schatzki ring at the GE junction status post dilation.  Gastritis.  Biopsy with mild nonspecific  reactive gastropathy.  No H. pylori.  Marland Kitchen GIVENS CAPSULE STUDY N/A 06/24/2019   normal  . HERNIA REPAIR  1986   Left inguinal  . INTRAVASCULAR PRESSURE WIRE/FFR STUDY Left 06/08/2017   Procedure: INTRAVASCULAR PRESSURE WIRE/FFR STUDY;  Surgeon: Nelva Bush, MD;  Location: [facility];  Service: Cardiovascular;  Laterality: Left;  LAD and CFX  . LAPAROTOMY N/A 09/17/2018   Procedure: EXPLORATORY LAPAROTOMY;  Surgeon: Virl Cagey, MD;  Location: [facility];  Service: General;  Laterality: N/A;  . LEFT HEART CATH AND CORONARY ANGIOGRAPHY N/A 06/08/2017   Procedure: LEFT HEART CATH AND CORONARY ANGIOGRAPHY;  Surgeon: Nelva Bush, MD;  Location: [facility];  Service: Cardiovascular;  Laterality: N/A;  . POLYPECTOMY  07/17/2018   Procedure: POLYPECTOMY;  Surgeon: Danie Binder, MD;  [facility];  Service: Endoscopy;;  colon  . ROTATOR CUFF REPAIR     Right  . SAVORY DILATION N/A 07/17/2018   Procedure: SAVORY DILATION;  Surgeon: Danie Binder, MD;  [facility];  Service: Endoscopy;  Laterality: N/A;   Past Surgical History:  Procedure Laterality Date  . BIOPSY  07/17/2018   Procedure: BIOPSY;  Surgeon: Danie Binder, MD;  [facility];  Service: Endoscopy;;  colon  . BOWEL RESECTION  09/17/2018   SMALL BOWEL RESECTION: 71 CM   . CARDIAC CATHETERIZATION N/A 07/21/2015   Procedure: Left Heart Cath and Coronary Angiography;  Surgeon: Peter M Martinique, MD;  Location: [facility];  Service: Cardiovascular;  Laterality: N/A;  . CARDIAC CATHETERIZATION N/A 07/21/2015   Procedure: Coronary Stent Intervention;  Surgeon: Peter M Martinique, MD;  Location: [facility];  Service: Cardiovascular;  Laterality: N/A;  . CIRCUMCISION N/A 04/05/2019   Procedure: CIRCUMCISION ADULT;  Surgeon: Irine Seal, MD;  Location: [facility];  Service: Urology;  Laterality: N/A;  . COLONOSCOPY N/A 05/19/2014   Dr. Barnie Alderman diverticulosis/moderate  external hemorrhoids, >20 simple adenomas. Genetic screening negative.   . COLONOSCOPY WITH PROPOFOL N/A 07/17/2018   Dr. Oneida Alar: Diverticulosis, external/internal hemorrhoids, 32 colon polyps removed.  ten tubular adenomas removed with no high-grade dysplasia.  Advised to have surveillance colonoscopy in 3 years.  . ESOPHAGOGASTRODUODENOSCOPY (EGD) WITH PROPOFOL N/A 07/17/2018   Dr. Oneida Alar: Low-grade narrowing Schatzki ring at the GE junction status post dilation.  Gastritis.  Biopsy with mild nonspecific reactive gastropathy.  No H. pylori.  Marland Kitchen GIVENS CAPSULE STUDY N/A 06/24/2019   normal  . HERNIA REPAIR  1986   Left inguinal  . INTRAVASCULAR PRESSURE WIRE/FFR STUDY Left 06/08/2017   Procedure: INTRAVASCULAR PRESSURE WIRE/FFR STUDY;  Surgeon: Nelva Bush, MD;  Location: [facility];  Service: Cardiovascular;  Laterality: Left;  LAD and CFX  . LAPAROTOMY N/A 09/17/2018   Procedure: EXPLORATORY LAPAROTOMY;  Surgeon: Constance Haw,  Lanell Matar, MD;  Location: [facility];  Service: General;  Laterality: N/A;  . LEFT HEART CATH AND CORONARY ANGIOGRAPHY N/A 06/08/2017   Procedure: LEFT HEART CATH AND CORONARY ANGIOGRAPHY;  Surgeon: Nelva Bush, MD;  Location: [place] [facility];  Service: Cardiovascular;  Laterality: N/A;  . POLYPECTOMY  07/17/2018   Procedure: POLYPECTOMY;  Surgeon: Danie Binder, MD;  [facility];  Service: Endoscopy;;  colon  . ROTATOR CUFF REPAIR     Right  . SAVORY DILATION N/A 07/17/2018   Procedure: SAVORY DILATION;  Surgeon: Danie Binder, MD;  [facility];  Service: Endoscopy;  Laterality: N/A;   Past Medical History:  Diagnosis Date  . Anemia   . Anxiety   . Arteriosclerotic cardiovascular disease (ASCVD)    Nonobstructive; cath in 2000: 30-40% mid LAD and proximal RCA;normal EF. stress nuclear in 2006-subtle inferoseptal hypoperfusion with reversibility; negative stress EKG; good exercise tolerance  . Asthma   . Cancer ([place])     skin cancer  . Cataract   . CHF (congestive heart failure) ([place])   . Colon polyps    30 colon polyps found on first colonoscopy  . COPD (chronic obstructive pulmonary disease) ([place])   . Diabetes mellitus without complication ([place])   . Diverticulitis   . DJD (degenerative joint disease)   . GERD (gastroesophageal reflux disease)   . History of kidney stones   . Hyperlipidemia   . Hypertension   . Insomnia   . Obstructive sleep apnea 12/2009   01/26/2010 AHI 83/hr  . Paroxysmal atrial fibrillation ([place]) 10/2004   Onset in 10/2004; recurred 09/2008  . PUD (peptic ulcer disease)    1980s  . RLS (restless legs syndrome)   . Sinusitis    BP (!) 143/93   Pulse 87   Temp 98.3 F (36.8 C)   Ht 5\' 8"  (1.727 m)   Wt 217 lb (98.4 kg)   SpO2 96%   BMI 32.99 kg/m   Opioid Risk Score:   Fall Risk Score:  `1  Depression screen PHQ 2/9  Depression screen [facility] 2/9 06/23/2020 05/01/2020 01/08/2020 06/21/2018 05/11/2017  Decreased Interest 1 3 1 2  0  Down, Depressed, Hopeless 0 1 1 2  0  PHQ - 2 Score 1 4 2 4  0  Altered sleeping 1 1 1 2  0  Tired, decreased energy 1 2 2 2 1   Change in appetite 0 2 1 2 1   Feeling bad or failure about yourself  0 2 1 0 1  Trouble concentrating 0 2 1 1 1   Moving slowly or fidgety/restless 0 2 2 0 0  Suicidal thoughts 0 0 0 0 0  PHQ-9 Score 3 15 10 11 4   Difficult doing work/chores - Very difficult Somewhat difficult Somewhat difficult Somewhat difficult  Some recent data might be hidden    Review of Systems  Constitutional: Negative.   HENT: Negative.   Eyes: Negative.   Respiratory: Negative.   Gastrointestinal: Negative.   Endocrine: Negative.   Genitourinary: Negative.   Musculoskeletal: Positive for back pain.  Skin: Negative.   Allergic/Immunologic: Negative.   Neurological: Negative.   Hematological: Negative.   Psychiatric/Behavioral: Negative.   All other systems reviewed and are negative.      Objective:   Physical Exam Vitals and  nursing note reviewed.  Constitutional:      Appearance: He is obese.  HENT:     Head: Normocephalic and atraumatic.  Eyes:     Extraocular  Movements: Extraocular movements intact.     Conjunctiva/sclera: Conjunctivae normal.     Pupils: Pupils are equal, round, and reactive to light.  Abdominal:     General: Abdomen is flat. Bowel sounds are normal.     Palpations: Abdomen is soft.  Musculoskeletal:        General: No tenderness.     Cervical back: Normal range of motion.     Right lower leg: No edema.     Left lower leg: No edema.     Comments: Reduced right hip internal and external rotation Normal left hip internal and external rotation  Skin:    General: Skin is warm and dry.  Neurological:     General: No focal deficit present.     Mental Status: He is alert and oriented to person, place, and time.     Comments: Motor strength is 5/5 bilateral hip flexor knee extensor ankle dorsiflexor plantar flexor. Negative straight leg raise test bilaterally.  Ambulates without assistive device no evidence of toe drag or knee instability  Psychiatric:        Mood and Affect: Mood normal.        Behavior: Behavior normal.           Assessment & Plan:  #1.  Chronic right hip as well as low back pain, he had good relief for his right groin pain with right hip intra-articular injection under fluoroscopic guidance.  We discussed that it still appears to be effective and if it feels like it is starting to wear off he can call this office to make an appointment for repeat We discussed that we cannot do it any sooner than every 3 months.  Patient with chronic low back pain, continue tramadol 50 mg twice daily no signs of side effects, reviewed PDMP.  Will need to see every 6 months.  Patient signed CSA as well as opioid consent, will need UDS 1-2 times per year

## 2020-10-23 MED FILL — SILDENAFIL CITRATE 100 MG T: 100 | 30 days supply | Qty: 6 | Fill #2

## 2020-10-29 ENCOUNTER — Telehealth: Payer: Self-pay | Admitting: Physician Assistant

## 2020-10-29 DIAGNOSIS — I2510 Atherosclerotic heart disease of native coronary artery without angina pectoris: Secondary | ICD-10-CM | POA: Diagnosis not present

## 2020-10-29 DIAGNOSIS — I10 Essential (primary) hypertension: Secondary | ICD-10-CM

## 2020-10-29 DIAGNOSIS — E119 Type 2 diabetes mellitus without complications: Secondary | ICD-10-CM

## 2020-10-29 DIAGNOSIS — E785 Hyperlipidemia, unspecified: Secondary | ICD-10-CM | POA: Diagnosis not present

## 2020-10-29 DIAGNOSIS — Z9861 Coronary angioplasty status: Secondary | ICD-10-CM | POA: Diagnosis not present

## 2020-10-29 LAB — HEPATIC FUNCTION PANEL
ALT: 26 IU/L (ref 0–44)
AST: 18 IU/L (ref 0–40)
Albumin: 4.4 g/dL (ref 3.8–4.8)
Alkaline Phosphatase: 100 IU/L (ref 44–121)
Bilirubin Total: 0.5 mg/dL (ref 0.0–1.2)
Bilirubin, Direct: 0.17 mg/dL (ref 0.00–0.40)
Total Protein: 6.8 g/dL (ref 6.0–8.5)

## 2020-10-29 LAB — LIPID PANEL
Chol/HDL Ratio: 4 ratio (ref 0.0–5.0)
Cholesterol, Total: 115 mg/dL (ref 100–199)
HDL: 29 mg/dL — ABNORMAL LOW (ref >39)
LDL Chol Calc (NIH): 58 mg/dL (ref 0–99)
Triglycerides: 162 mg/dL — ABNORMAL HIGH (ref 0–149)
VLDL Cholesterol Cal: 28 mg/dL (ref 5–40)

## 2020-10-29 NOTE — Telephone Encounter (Signed)
..  Medication Refills  Last OV:  Medication:  Metformin & Lisinopril Pharmacy:  [facility] Let patient know to contact pharmacy at the end of the day to make sure medication is ready.   Please notify patient to allow 48-72 hours to process.  Encourage patient to contact the pharmacy for refills or they can request refills through [place] out below:   Last refill:  QTY:  Refill Date:    Other Comments:   Okay for refill?  Please advise.

## 2020-10-30 ENCOUNTER — Other Ambulatory Visit: Payer: Self-pay | Admitting: Cardiology

## 2020-10-30 ENCOUNTER — Other Ambulatory Visit: Payer: Self-pay | Admitting: Family

## 2020-10-30 DIAGNOSIS — I5032 Chronic diastolic (congestive) heart failure: Secondary | ICD-10-CM

## 2020-10-30 MED ORDER — METFORMIN HCL 500 MG PO TABS: ORAL_TABLET | ORAL | 0 refills | Status: DC

## 2020-10-30 MED ORDER — LISINOPRIL 2.5 MG PO TABS: ORAL_TABLET | ORAL | 0 refills | Status: DC

## 2020-10-30 MED FILL — FUROSEMIDE 20 MG TABS: 20 | 15 days supply | Qty: 30 | Fill #0

## 2020-10-30 MED FILL — LISINOPRIL 2.5 MG TABLET: 2.5 | 90 days supply | Qty: 90 | Fill #0

## 2020-10-30 MED FILL — EZETIMIBE 10 MG TABS: 10 | 90 days supply | Qty: 90 | Fill #1

## 2020-10-30 MED FILL — METFORMIN HCL 500 MG TABS: 500 | 90 days supply | Qty: 180 | Fill #0

## 2020-10-30 NOTE — Telephone Encounter (Signed)
Rx sent to the preferred patient pharmacy  

## 2020-11-02 ENCOUNTER — Other Ambulatory Visit: Payer: Self-pay | Admitting: Physical Medicine & Rehabilitation

## 2020-11-03 ENCOUNTER — Other Ambulatory Visit: Payer: Self-pay | Admitting: Physical Medicine & Rehabilitation

## 2020-11-17 MED FILL — SILDENAFIL CITRATE 100 MG T: 100 | 30 days supply | Qty: 6 | Fill #3

## 2020-12-02 ENCOUNTER — Other Ambulatory Visit (HOSPITAL_COMMUNITY): Payer: Self-pay

## 2020-12-02 ENCOUNTER — Other Ambulatory Visit (HOSPITAL_BASED_OUTPATIENT_CLINIC_OR_DEPARTMENT_OTHER): Payer: Self-pay

## 2020-12-02 MED FILL — Furosemide Tab 20 MG: ORAL | 15 days supply | Qty: 30 | Fill #0 | Status: AC

## 2020-12-02 MED FILL — Furosemide Tab 20 MG: ORAL | 15 days supply | Qty: 30 | Fill #0 | Status: CN

## 2020-12-03 ENCOUNTER — Other Ambulatory Visit (HOSPITAL_BASED_OUTPATIENT_CLINIC_OR_DEPARTMENT_OTHER): Payer: Self-pay

## 2020-12-03 MED FILL — Ropinirole Hydrochloride Tab 3 MG: ORAL | 90 days supply | Qty: 90 | Fill #0 | Status: CN

## 2020-12-09 ENCOUNTER — Other Ambulatory Visit (HOSPITAL_BASED_OUTPATIENT_CLINIC_OR_DEPARTMENT_OTHER): Payer: Self-pay

## 2020-12-09 ENCOUNTER — Other Ambulatory Visit: Payer: Self-pay | Admitting: Physical Medicine & Rehabilitation

## 2020-12-09 MED ORDER — TRAMADOL HCL 50 MG PO TABS: ORAL_TABLET | ORAL | 4 refills | Status: DC

## 2020-12-09 MED ORDER — TRAMADOL HCL 50 MG PO TABS
ORAL_TABLET | ORAL | 4 refills | Status: DC
  Filled 2020-12-09: qty 60, 30d supply, fill #0
  Filled 2021-01-07: qty 60, 30d supply, fill #1
  Filled 2021-02-08: qty 60, 30d supply, fill #2
  Filled 2021-03-10: qty 60, 30d supply, fill #3

## 2020-12-09 NOTE — Telephone Encounter (Signed)
Called to [employer].

## 2020-12-10 ENCOUNTER — Other Ambulatory Visit (HOSPITAL_BASED_OUTPATIENT_CLINIC_OR_DEPARTMENT_OTHER): Payer: Self-pay

## 2020-12-17 ENCOUNTER — Other Ambulatory Visit (HOSPITAL_BASED_OUTPATIENT_CLINIC_OR_DEPARTMENT_OTHER): Payer: Self-pay

## 2020-12-17 ENCOUNTER — Other Ambulatory Visit (HOSPITAL_COMMUNITY): Payer: Self-pay

## 2020-12-17 ENCOUNTER — Other Ambulatory Visit: Payer: Self-pay | Admitting: Cardiology

## 2020-12-17 MED ORDER — SILDENAFIL CITRATE 100 MG PO TABS
100.0000 mg | ORAL_TABLET | ORAL | 0 refills | Status: DC
Start: 2020-12-17 — End: 2021-04-13
  Filled 2020-12-17: qty 5, 30d supply, fill #0
  Filled 2021-01-14: qty 5, 30d supply, fill #1
  Filled 2021-02-08: qty 5, 30d supply, fill #2
  Filled 2021-03-10 – 2021-03-11 (×2): qty 5, 30d supply, fill #3
  Filled 2021-03-18 – 2021-04-07 (×2): qty 5, 30d supply, fill #0

## 2020-12-17 MED FILL — Sildenafil Citrate Tab 100 MG: ORAL | 1 days supply | Qty: 1 | Fill #0 | Status: CN

## 2020-12-17 MED FILL — Ropinirole Hydrochloride Tab 3 MG: ORAL | 90 days supply | Qty: 90 | Fill #0 | Status: AC

## 2020-12-18 ENCOUNTER — Other Ambulatory Visit (HOSPITAL_BASED_OUTPATIENT_CLINIC_OR_DEPARTMENT_OTHER): Payer: Self-pay

## 2020-12-18 ENCOUNTER — Other Ambulatory Visit (HOSPITAL_COMMUNITY): Payer: Self-pay

## 2020-12-18 MED FILL — Furosemide Tab 20 MG: ORAL | 15 days supply | Qty: 30 | Fill #1 | Status: AC

## 2020-12-31 ENCOUNTER — Other Ambulatory Visit: Payer: Self-pay

## 2020-12-31 ENCOUNTER — Other Ambulatory Visit (HOSPITAL_BASED_OUTPATIENT_CLINIC_OR_DEPARTMENT_OTHER): Payer: Self-pay

## 2020-12-31 ENCOUNTER — Ambulatory Visit: Payer: 59 | Admitting: Gastroenterology

## 2020-12-31 ENCOUNTER — Encounter: Payer: Self-pay | Admitting: Gastroenterology

## 2020-12-31 ENCOUNTER — Other Ambulatory Visit: Payer: Self-pay | Admitting: *Deleted

## 2020-12-31 ENCOUNTER — Telehealth: Payer: Self-pay | Admitting: Gastroenterology

## 2020-12-31 VITALS — BP 118/84 | HR 72 | Temp 97.5°F | Ht 68.0 in | Wt 216.8 lb

## 2020-12-31 DIAGNOSIS — K219 Gastro-esophageal reflux disease without esophagitis: Secondary | ICD-10-CM | POA: Diagnosis not present

## 2020-12-31 DIAGNOSIS — K439 Ventral hernia without obstruction or gangrene: Secondary | ICD-10-CM | POA: Diagnosis not present

## 2020-12-31 DIAGNOSIS — K429 Umbilical hernia without obstruction or gangrene: Secondary | ICD-10-CM

## 2020-12-31 DIAGNOSIS — K432 Incisional hernia without obstruction or gangrene: Secondary | ICD-10-CM | POA: Diagnosis present

## 2020-12-31 DIAGNOSIS — Z86010 Personal history of colonic polyps: Secondary | ICD-10-CM | POA: Diagnosis not present

## 2020-12-31 MED ORDER — OMEPRAZOLE 40 MG PO CPDR
DELAYED_RELEASE_CAPSULE | ORAL | 3 refills | Status: DC
  Filled 2020-12-31: qty 90, 90d supply, fill #0
  Filled 2021-04-01: qty 90, 90d supply, fill #1
  Filled 2021-07-08: qty 90, 90d supply, fill #2
  Filled 2021-10-06: qty 90, 90d supply, fill #3

## 2020-12-31 MED FILL — Furosemide Tab 20 MG: ORAL | 30 days supply | Qty: 60 | Fill #2 | Status: AC

## 2020-12-31 NOTE — Progress Notes (Signed)
Referring Provider: Delorse Limber Primary Care Physician:  Brunetta Jeans, PA-C Primary GI: Dr. Abbey Chatters  Chief Complaint  Patient presents with  . Gastroesophageal Reflux    Doing ok    HPI:   Shaun [name] is a 65 y.o. male presenting today with a history of chronic microcytic anemia, with labs consistent with IDA but improved and now followed by PCP, on oral iron. EGD/TCS11/2019:low-grade narrowing Schatzki ring which was dilated. Gastritis. 32 polyps removed from the colon, 10 were tubular adenomas. Next colonoscopy 3 years(2022).Capsule study to complete GI work-up late 2020 was normal. On chronic Eliquis. Chronic GERD.   Rare vomiting. Has felt to have delayed gastric emptying in setting of diabetes. Only once every few months.  Has definitely improved. No significant abdominal pain. Omeprazole once daily. Zofran just as needed. No overt GI bleeding. Has a ventral hernia that bulges and is uncomfortable. States this happens frequently. Reduces on own. Would like referral to surgeon.    Past Medical History:  Diagnosis Date  . Anemia   . Anxiety   . Arteriosclerotic cardiovascular disease (ASCVD)    Nonobstructive; cath in 2000: 30-40% mid LAD and proximal RCA;normal EF. stress nuclear in 2006-subtle inferoseptal hypoperfusion with reversibility; negative stress EKG; good exercise tolerance  . Asthma   . Cancer ([place])    skin cancer  . Cataract   . CHF (congestive heart failure) ([place])   . Colon polyps    30 colon polyps found on first colonoscopy  . COPD (chronic obstructive pulmonary disease) ([place])   . Diabetes mellitus without complication ([place])   . Diverticulitis   . DJD (degenerative joint disease)   . GERD (gastroesophageal reflux disease)   . History of kidney stones   . Hyperlipidemia   . Hypertension   . Insomnia   . Obstructive sleep apnea 12/2009   01/26/2010 AHI 83/hr  . Paroxysmal atrial fibrillation ([place]) 10/2004   Onset in 10/2004;  recurred 09/2008  . PUD (peptic ulcer disease)    1980s  . RLS (restless legs syndrome)   . Sinusitis     Past Surgical History:  Procedure Laterality Date  . BIOPSY  07/17/2018   Procedure: BIOPSY;  Surgeon: Danie Binder, MD;  [facility];  Service: Endoscopy;;  colon  . BOWEL RESECTION  09/17/2018   SMALL BOWEL RESECTION: 71 CM   . CARDIAC CATHETERIZATION N/A 07/21/2015   Procedure: Left Heart Cath and Coronary Angiography;  Surgeon: Peter M Martinique, MD;  Location: [facility];  Service: Cardiovascular;  Laterality: N/A;  . CARDIAC CATHETERIZATION N/A 07/21/2015   Procedure: Coronary Stent Intervention;  Surgeon: Peter M Martinique, MD;  Location: [facility];  Service: Cardiovascular;  Laterality: N/A;  . CIRCUMCISION N/A 04/05/2019   Procedure: CIRCUMCISION ADULT;  Surgeon: Irine Seal, MD;  Location: [facility];  Service: Urology;  Laterality: N/A;  . COLONOSCOPY N/A 05/19/2014   Dr. Barnie Alderman diverticulosis/moderate external hemorrhoids, >20 simple adenomas. Genetic screening negative.   . COLONOSCOPY WITH PROPOFOL N/A 07/17/2018   Dr. Oneida Alar: Diverticulosis, external/internal hemorrhoids, 32 colon polyps removed.  ten tubular adenomas removed with no high-grade dysplasia.  Advised to have surveillance colonoscopy in 3 years.  . ESOPHAGOGASTRODUODENOSCOPY (EGD) WITH PROPOFOL N/A 07/17/2018   Dr. Oneida Alar: Low-grade narrowing Schatzki ring at the GE junction status post dilation.  Gastritis.  Biopsy with mild nonspecific reactive gastropathy.  No H. pylori.  Marland Kitchen GIVENS CAPSULE STUDY N/A 06/24/2019   normal  .  HERNIA REPAIR  1986   Left inguinal  . INTRAVASCULAR PRESSURE WIRE/FFR STUDY Left 06/08/2017   Procedure: INTRAVASCULAR PRESSURE WIRE/FFR STUDY;  Surgeon: Nelva Bush, MD;  Location: [facility];  Service: Cardiovascular;  Laterality: Left;  LAD and CFX  . LAPAROTOMY N/A 09/17/2018   Procedure: EXPLORATORY LAPAROTOMY;  Surgeon: Virl Cagey, MD;   Location: [facility];  Service: General;  Laterality: N/A;  . LEFT HEART CATH AND CORONARY ANGIOGRAPHY N/A 06/08/2017   Procedure: LEFT HEART CATH AND CORONARY ANGIOGRAPHY;  Surgeon: Nelva Bush, MD;  Location: [facility];  Service: Cardiovascular;  Laterality: N/A;  . POLYPECTOMY  07/17/2018   Procedure: POLYPECTOMY;  Surgeon: Danie Binder, MD;  [facility];  Service: Endoscopy;;  colon  . ROTATOR CUFF REPAIR     Right  . SAVORY DILATION N/A 07/17/2018   Procedure: SAVORY DILATION;  Surgeon: Danie Binder, MD;  [facility];  Service: Endoscopy;  Laterality: N/A;    Current Outpatient Medications  Medication Sig Dispense Refill  . albuterol (VENTOLIN HFA) 108 (90 Base) MCG/ACT inhaler INHALE 2 PUFFS INTO THE LUNGS EVERY 6 HOURS AS NEEDED FOR WHEEZING OR SHORTNESS OF BREATH 18 g 11  . atorvastatin (LIPITOR) 80 MG tablet TAKE 1 TABLET(80 MG) BY MOUTH EVERY EVENING 90 tablet 0  . buPROPion (WELLBUTRIN XL) 150 MG 24 hr tablet Take 3 tablets once daily 270 tablet 0  . docusate sodium (COLACE) 100 MG capsule Take 100 mg by mouth as needed for mild constipation.    Marland Kitchen ELIQUIS 5 MG TABS tablet TAKE 1 TABLET(5 MG) BY MOUTH TWICE DAILY 60 tablet 6  . escitalopram (LEXAPRO) 20 MG tablet TAKE 1 TABLET(20 MG) BY MOUTH DAILY 90 tablet 0  . ezetimibe (ZETIA) 10 MG tablet TAKE 1 TABLET BY MOUTH EVERY DAY 330 tablet 0  . ferrous sulfate 325 (65 FE) MG tablet Take 325 mg by mouth daily with breakfast.    . furosemide (LASIX) 20 MG tablet TAKE 2 TABLETS BY MOUTH DAILY 30 tablet 8  . furosemide (LASIX) 20 MG tablet TAKE 2 TABLETS BY MOUTH DAILY 30 tablet 0  . lisinopril (ZESTRIL) 2.5 MG tablet TAKE 1 TABLET(2.5 MG) BY MOUTH DAILY. 90 tablet 0  . meclizine (ANTIVERT) 25 MG tablet Take 1 tablet (25 mg total) by mouth 3 (three) times daily as needed for dizziness. 30 tablet 0  . metFORMIN (GLUCOPHAGE) 500 MG tablet TAKE ONE TABLET (500 MG) BY MOUTH TWICE DAILY WITH A MEAL. 180  tablet 0  . metoprolol tartrate (LOPRESSOR) 100 MG tablet Take 1 tablet (100 mg total) by mouth 2 (two) times daily. 180 tablet 3  . nitroGLYCERIN (NITROSTAT) 0.4 MG SL tablet Place 1 tablet (0.4 mg total) under the tongue every 5 (five) minutes as needed. 25 tablet 3  . omeprazole (PRILOSEC) 40 MG capsule TAKE 1 CAPSULE(40 MG) BY MOUTH DAILY 90 capsule 0  . ondansetron (ZOFRAN) 4 MG tablet Take 1 tablet (4 mg total) by mouth every 8 (eight) hours as needed for nausea or vomiting. 30 tablet 1  . potassium chloride SA (KLOR-CON) 20 MEQ tablet Take 1 tablet (20 mEq total) by mouth daily. 90 tablet 3  . rOPINIRole (REQUIP) 0.25 MG tablet TAKE 1 TABLET BY MOUTH EVERY NIGHT AT BEDTIME (WHEN NEEDED FOR BAD NIGHTS USE 3MG  TABLET) 30 tablet 0  . rOPINIRole (REQUIP) 3 MG tablet TAKE 1 TABLET BY MOUTH EVERY NIGHT AT BEDTIME 450 tablet 0  . sildenafil (  VIAGRA) 100 MG tablet TAKE 1 TABLET BY MOUTH 30 MINUTES BEFORE ACTIVITY 2 tablet 0  . sildenafil (VIAGRA) 100 MG tablet TAKE 1 TABLET BY MOUTH 30 MINUTES BEFORE ACTIVITY 25 tablet 0  . sodium chloride (OCEAN) 0.65 % SOLN nasal spray Place 1 spray into both nostrils daily as needed for congestion.     . sucralfate (CARAFATE) 1 g tablet Take one tablet po BID PRN 42 tablet 5  . tetrahydrozoline 0.05 % ophthalmic solution Place 2 drops into both eyes daily as needed (for dry eyes).    . traMADol (ULTRAM) 50 MG tablet TAKE 1 TABLET BY MOUTH TWICE DAILY AS NEEDED FOR MODERATE PAIN. 60 tablet 4  . traZODone (DESYREL) 50 MG tablet TAKE 2 TABLETS (100 MG) BY MOUTH AT BEDTIME 180 tablet 1  . triamcinolone cream (KENALOG) 0.1 % Apply 1 application topically 2 (two) times daily. (Patient taking differently: Apply 1 application topically 2 (two) times daily. Pt takes as needed.) 60 g 1   No current facility-administered medications for this visit.    Allergies as of 12/31/2020  . (No Known Allergies)    Family History  Problem Relation Age of Onset  . Hypertension  Mother   . Breast cancer Mother 78       brain/bone   . Heart attack Father   . Diabetes Brother   . Skin cancer Sister 29  . Brain cancer Maternal Uncle   . Cancer Maternal Uncle        NOS  . Breast cancer Cousin        maternal cousin dx <50  . Cancer Cousin   . Colon cancer Neg Hx     Social History   Socioeconomic History  . Marital status: Married    Spouse name: Not on file  . Number of children: 5  . Years of education: Not on file  . Highest education level: Not on file  Occupational History  . Occupation: employed    Fish farm manager: [employer]    Comment: full-time  Tobacco Use  . Smoking status: Current Every Day Smoker    Packs/day: 0.50    Years: 39.00    Pack years: 19.50    Types: Cigarettes    Start date: 07/24/1970  . Smokeless tobacco: Never Used  Vaping Use  . Vaping Use: Never used  Substance and Sexual Activity  . Alcohol use: No    Alcohol/week: 0.0 standard drinks  . Drug use: No  . Sexual activity: Yes    Partners: Female  Other Topics Concern  . Not on file  Social History Narrative  . Not on file   Social Determinants of Health   Financial Resource Strain: Not on file  Food Insecurity: Not on file  Transportation Needs: Not on file  Physical Activity: Not on file  Stress: Not on file  Social Connections: Not on file    Review of Systems: Gen: Denies fever, chills, anorexia. Denies fatigue, weakness, weight loss.  CV: Denies chest pain, palpitations, syncope, peripheral edema, and claudication. Resp: Denies dyspnea at rest, cough, wheezing, coughing up blood, and pleurisy. GI: see HPI Derm: Denies rash, itching, dry skin Psych: Denies depression, anxiety, memory loss, confusion. No homicidal or suicidal ideation.  Heme: Denies bruising, bleeding, and enlarged lymph nodes.  Physical Exam: BP 118/84   Pulse 72   Temp (!) 97.5 F (36.4 C) (Temporal)   Ht 5\' 8"  (1.727 m)   Wt 216 lb 12.8 oz (98.3 kg)   BMI  32.96 kg/m  General:    Alert and oriented. No distress noted. Pleasant and cooperative.  Head:  Normocephalic and atraumatic. Eyes:  Conjuctiva clear without scleral icterus. Mouth:  Mask in place Abdomen:  +BS, soft, non-tender and non-distended. No rebound or guarding. Umbilical hernia, reducible Msk:  Symmetrical without gross deformities. Normal posture. Extremities:  Without edema. Neurologic:  Alert and  oriented x4 Psych:  Alert and cooperative. Normal mood and affect.  ASSESSMENT: Shaun [name] is a 65 y.o. male presenting today with a history of microcytic anemia in the past and labs consistent with IDA but improved and now followed by PCP, on oral iron. EGD/TCS11/2019:low-grade narrowing Schatzki ring which was dilated. Gastritis. 32 polyps removed from the colon, 10 were tubular adenomas. Surveillance colonoscopy due later this year(2022).Capsule study to complete GI work-up late 2020 was normal. On chronic Eliquis. Chronic GERD.  GERD overall well controlled with omeprazole daily. At last visit, he had noted occasional vomiting of undigested food but this has improved significantly.   Ventral hernia at umbilicus has been bothersome to patient. Reducible on exam. We discussed referral to Gen Surg. With his cardiac history, will refer to [facility].   Colonoscopy due later this year; will discuss triaging to avoid another office visit if nothing has changed health-wise. He will need to hold Eliquis 2 days prior.    PLAN:  Prilosec daily Referral to Gen Surgery for hernia Will arrange colonoscopy later this year due to history of multiple polyps. Hold Eliquis 2 days prior Return in 1 year otherwise  Annitta Needs, PhD, ANP-BC [facility] Gastroenterology

## 2020-12-31 NOTE — Telephone Encounter (Signed)
Angie,  It's ok to triage patient when it gets closer to fall 2022 (Sept/October) for colonoscopy. I spoke with Dr. Abbey Chatters about it. He doesn't need another office visit.   Thanks!

## 2020-12-31 NOTE — Telephone Encounter (Signed)
Noted  

## 2020-12-31 NOTE — Patient Instructions (Signed)
I have refilled omeprazole for you. Please call if you have frequent vomiting, abdominal pain, weight loss, etc.  I am referring you to [facility] for consideration of hernia repair.  I will see if we can triage you for colonoscopy in the fall.   We will see you back in 1 year otherwise!  I enjoyed seeing you again today! As you know, I value our relationship and want to provide genuine, compassionate, and quality care. I welcome your feedback. If you receive a survey regarding your visit,  I greatly appreciate you taking time to fill this out. See you next time!  Annitta Needs, PhD, ANP-BC [facility] Gastroenterology

## 2021-01-06 NOTE — Progress Notes (Signed)
Cc'ed to pcp °

## 2021-01-07 ENCOUNTER — Other Ambulatory Visit (HOSPITAL_BASED_OUTPATIENT_CLINIC_OR_DEPARTMENT_OTHER): Payer: Self-pay

## 2021-01-07 NOTE — Telephone Encounter (Addendum)
Spoke with pt.  He scheduled nurse triage visit for 05/11/2021 at 9:00.

## 2021-01-11 ENCOUNTER — Encounter: Payer: 59 | Admitting: Registered Nurse

## 2021-01-14 ENCOUNTER — Other Ambulatory Visit (HOSPITAL_COMMUNITY): Payer: Self-pay

## 2021-01-27 ENCOUNTER — Telehealth: Payer: Self-pay | Admitting: *Deleted

## 2021-01-27 ENCOUNTER — Ambulatory Visit: Payer: Self-pay | Admitting: Surgery

## 2021-01-27 DIAGNOSIS — K432 Incisional hernia without obstruction or gangrene: Secondary | ICD-10-CM | POA: Diagnosis not present

## 2021-01-27 NOTE — Telephone Encounter (Signed)
   [facility] Pre-operative Risk Assessment    Patient Name: Shaun [name]  DOB: 1956-01-01  MRN: [id number]   HEARTCARE STAFF: - Please ensure there is not already an duplicate clearance open for this procedure. - Under Visit Info/Reason for Call, type in Other and utilize the format Clearance MM/DD/YY or Clearance TBD. Do not use dashes or single digits. - If request is for dental extraction, please clarify the # of teeth to be extracted. - If the patient is currently at the dentist's office, call Pre-Op APP to address. If the patient is not currently in the dentist office, please route to the Pre-Op pool  Request for surgical clearance:  1. What type of surgery is being performed? Ventral hernia repair with mesh   2. When is this surgery scheduled? TBD   3. What type of clearance is required (medical clearance vs. Pharmacy clearance to hold med vs. Both)? both  4. Are there any medications that need to be held prior to surgery and how long?eliquis-need direction   5. Practice name and name of physician performing surgery? [facility]   6. What is the office phone number? [phone]   7.   What is the office fax number? [phone]  8.   Anesthesia type (None, local, MAC, general) ? general   Fredia Beets 01/27/2021, 5:22 PM  _________________________________________________________________   (provider comments below)

## 2021-01-27 NOTE — H&P (Signed)
History of Present Illness (Shaun Grim L. Zenia Resides MD; 01/27/2021 3:33 PM) The patient is a 65 year old male who presents with an incisional hernia.Shaun [name] is a 65 yo male who was referred for evaluation of a hernia. He had a laparotomy about 2 years ago for a bowel perforation with abscess, and underwent a small bowel resection. After surgery he developed a hernia near his incision, which he says has been present for over a year now. It is most prominent when he stands and causes him frequent pain and discomfort. He has chronic nausea secondary to delayed gastric emptying but otherwise no obstructive symptoms. He also reports a hernia in his left flank that he says he developed after a previous surgery in the 1980s. He says he has had a bulge in the left flank since the late 80s, but it doesn't really bother him.  Of note, patient has a history of CAD and had stents placed in 2016. He is not on Plavix. He also has a history of chronic a-fib and is on Eliquis, as well as metoprolol for rate-control. He has diastolic heart failure and is on lasix. He reports mild dyspnea on exertion but is able to climb a flight of stairs and complete his ADLs. His cardiologist is Dr. Stanford Breed, who he last saw in October 2021. His last echo in August 2020 showed normal LV function. He has OSA. PFTs Dec 2020 showed no airflow obstruction.  PMH: a-fib, OSA (on CPAP), HTN, HLD, GERD, DM, COPD PSH: exploratory laparotomy and small bowel resection (Jan 2020), L inguinal hernia repair 1986 FHx: HTN, heart disease SocHx: No EtOH. Smokes 0.5ppd.   Past Surgical History Shaun [name], [place]; 01/27/2021 2:17 PM) Colon Polyp Removal - Colonoscopy  Open Inguinal Hernia Surgery  Left. Shoulder Surgery  Right.  Allergies Shaun [name], [place]; 01/27/2021 2:19 PM) No Known Drug Allergies  [01/27/2021]: Allergies Reconciled   Medication History Shaun [name], CMA; 01/27/2021 2:20 PM) Albuterol Sulfate HFA (108 (90 Base)MCG/ACT  Aerosol Soln, Inhalation) Active. Atorvastatin Calcium (80MG  Tablet, Oral) Active. Eliquis (5MG  Tablet, Oral) Active. Escitalopram Oxalate (20MG  Tablet, Oral) Active. Ezetimibe (10MG  Tablet, Oral) Active. Furosemide (20MG  Tablet, Oral) Active. Lisinopril (2.5MG  Tablet, Oral) Active. metFORMIN HCl (500MG  Tablet, Oral) Active. Metoprolol Tartrate (100MG  Tablet, Oral) Active. Omeprazole (40MG  Capsule DR, Oral) Active. Potassium Chloride Crys ER (20MEQ Tablet ER, Oral) Active. rOPINIRole HCl (3MG  Tablet, Oral) Active. Sildenafil Citrate (100MG  Tablet, Oral) Active. traMADol HCl (50MG  Tablet, Oral) Active. traZODone HCl (50MG  Tablet, Oral) Active. Medications Reconciled  Social History Shaun [name], [place]; 01/27/2021 2:17 PM) Alcohol use  Occasional alcohol use. Caffeine use  Carbonated beverages, Coffee. No drug use  Tobacco use  Current every day smoker.  Family History Shaun [name], [place]; 01/27/2021 2:17 PM) Breast Cancer  Mother. Heart Disease  Father. Hypertension  Father.  Other Problems Shaun [name], CMA; 01/27/2021 2:17 PM) Anxiety Disorder  Atrial Fibrillation  Depression  Diabetes Mellitus  Diverticulosis  Gastroesophageal Reflux Disease  High blood pressure  Hypercholesterolemia  Kidney Stone  Melanoma  Sleep Apnea     Review of Systems Shaun [name] CMA; 01/27/2021 2:17 PM) General Not Present- Appetite Loss, Chills, Fatigue, Fever, Night Sweats, Weight Gain and Weight Loss. Skin Present- Change in Wart/Mole. Not Present- Dryness, Hives, Jaundice, New Lesions, Non-Healing Wounds, Rash and Ulcer. HEENT Present- Seasonal Allergies. Not Present- Earache, Hearing Loss, Hoarseness, Nose Bleed, Oral Ulcers, Ringing in the Ears, Sinus Pain, Sore Throat, Visual Disturbances, Wears glasses/contact lenses and Yellow Eyes. Respiratory Not Present- Bloody  sputum, Chronic Cough, Difficulty Breathing, Snoring and Wheezing. Cardiovascular  Present- Difficulty Breathing Lying Down, Rapid Heart Rate and Shortness of Breath. Not Present- Chest Pain, Leg Cramps, Palpitations and Swelling of Extremities. Gastrointestinal Present- Difficulty Swallowing, Excessive gas and Nausea. Not Present- Abdominal Pain, Bloating, Bloody Stool, Change in Bowel Habits, Chronic diarrhea, Constipation, Gets full quickly at meals, Hemorrhoids, Indigestion, Rectal Pain and Vomiting. Male Genitourinary Not Present- Blood in Urine, Change in Urinary Stream, Frequency, Impotence, Nocturia, Painful Urination, Urgency and Urine Leakage. Musculoskeletal Present- Back Pain and Joint Pain. Not Present- Joint Stiffness, Muscle Pain, Muscle Weakness and Swelling of Extremities. Neurological Not Present- Decreased Memory, Fainting, Headaches, Numbness, Seizures, Tingling, Tremor, Trouble walking and Weakness. Psychiatric Present- Anxiety and Depression. Not Present- Bipolar, Change in Sleep Pattern, Fearful and Frequent crying. Endocrine Not Present- Cold Intolerance, Excessive Hunger, Hair Changes, Heat Intolerance, Hot flashes and New Diabetes. Hematology Present- Blood Thinners. Not Present- Easy Bruising, Excessive bleeding, Gland problems, HIV and Persistent Infections.  Vitals Shaun [name] CMA; 01/27/2021 2:19 PM) 01/27/2021 2:18 PM Weight: 215.2 lb Height: 68in Body Surface Area: 2.11 m Body Mass Index: 32.72 kg/m  Temp.: 98.87F  Pulse: 90 (Regular)  BP: 136/90(Sitting, Left Arm, Standard)       Physical Exam (Keiarah Orlowski L. Zenia Resides MD; 01/27/2021 3:35 PM) The physical exam findings are as follows: Note: Constitutional: No acute distress; conversant; no deformities Neuro: alert and oriented; cranial nerves grossly in tact; no focal deficits Eyes: Moist conjunctiva; anicteric sclerae; extraocular movements in tact Neck: Trachea midline Lungs: Normal respiratory effort; lungs clear to auscultation bilaterally; symmetric chest wall expansion CV:  Regular rate and rhythm; no murmurs; no pitting edema GI: Abdomen soft, nontender, nondistended; well-healed midline surgical scar at the umbilicus with an adjacent hernia, just to the right of the incision. This area is tender to palpation and partially reduces in the supine position, fascial defect feels to be about 6cm. No hernia defects or surgical scars in the left flank. MSK: Normal gait and station; no clubbing/cyanosis Psychiatric: Appropriate affect; alert and oriented 3    Assessment & Plan ([place] L. Zenia Resides MD; 01/27/2021 3:42 PM) VENTRAL INCISIONAL HERNIA WITHOUT OBSTRUCTION OR GANGRENE (K43.2) Story: 65 yo male with a reducible ventral incisional hernia. Given the degree of symptoms, I offered him surgical repair, which I will plan to do via his previous incision with placement of mesh. I discussed the details of the surgery. Given his cardiac history he will need cardiac clearance prior to surgery. Will also need to hold his Eliquis for 72 hours prior to surgery. I will likely admit him for overnight observation. Will also obtain noncon CT scan prior to surgery to better delineate anatomy for surgical planning. Patient will be contacted to schedule a surgery date once cardiac clearance is obtained. Patient was encouraged to attempt smoking cessation, as nicotine inhibits wound healing and increases risk of hernia recurrence.  Michaelle Birks, [facility] General, Hepatobiliary and Pancreatic Surgery 01/27/21 4:33 PM

## 2021-01-28 NOTE — Telephone Encounter (Signed)
    Shaun [name] DOB:  12-21-1955  MRN:  [id number]   Primary Cardiologist: Kirk Ruths, MD  Chart reviewed as part of pre-operative protocol coverage. Await results of CT abdomen to assess aorta prior to providing clearance recommendations. CT abdomen/pelvis has been ordered by [facility] and will be available in Epic when completed.   Will route to requesting party so they are updated that we are awaiting imaging prior to recommendations.   Loel Dubonnet, NP 01/28/2021, 2:47 PM

## 2021-01-28 NOTE — Telephone Encounter (Signed)
Patient with diagnosis of  on Eliquis for anticoagulation.    1. Procedure: Ventral hernia repair with mesh   Date of procedure: TBD   CHA2DS2-VASc Score = 4  This indicates a 4.8% annual risk of stroke. The patient's score is based upon: CHF History: Yes HTN History: Yes Diabetes History: Yes Stroke History: No Vascular Disease History: Yes Age Score: 0 Gender Score: 0   CrCl  98 mL/min Platelet count  168K  Per office protocol, patient can hold Eliquis for 2 days prior to procedure.

## 2021-01-28 NOTE — Telephone Encounter (Signed)
   Name: Shaun [name]  DOB: October 30, 1955  MRN: [id number]   Primary Cardiologist: Kirk Ruths, MD  Chart reviewed as part of pre-operative protocol coverage. [name] was last seen on 06/23/20 by Dr. Stanford Breed. He has a history of permanent atrial fibrillation maintained on metoprolol and apixaban, CAD, HTN, HLD, chronic diastolic heart failure, OSA. At last visit abdominal bruit was noted with recommendation for abdominal ultrasound to exclude aneurysm. It does not appear this was completed.  [facility] to see if abdominal ultrasound was collected as part of surgery workup. No noted ultrasound. Nurse shares with me that he has had previous small bowel resection with post operative development of hernia. He was just seen yesterday and a CT abdomen and pelvis w/o contrast has been ordered.   Will route to Dr. Stanford Breed for recommendations as likely will wait for these results to evaluate aorta prior to providing clearance. The results will be available in Epic, test date not yet set and insurance approval is pending.  His most recent cardiac testing includes echo 03/2019 LVEF 60-65%, severe basal septal hypertrophy, RV normal size and function, LA mild-mod dilated, normal aorta. LHC 05/2017 with mild to moderate nonobstructive disease recommended for medical management.   Loel Dubonnet, NP 01/28/2021, 1:33 PM

## 2021-02-01 ENCOUNTER — Other Ambulatory Visit (HOSPITAL_COMMUNITY): Payer: Self-pay

## 2021-02-01 ENCOUNTER — Other Ambulatory Visit: Payer: Self-pay | Admitting: Family

## 2021-02-01 ENCOUNTER — Other Ambulatory Visit (HOSPITAL_BASED_OUTPATIENT_CLINIC_OR_DEPARTMENT_OTHER): Payer: Self-pay

## 2021-02-01 DIAGNOSIS — E119 Type 2 diabetes mellitus without complications: Secondary | ICD-10-CM

## 2021-02-01 DIAGNOSIS — I10 Essential (primary) hypertension: Secondary | ICD-10-CM

## 2021-02-01 MED FILL — Ezetimibe Tab 10 MG: ORAL | 90 days supply | Qty: 90 | Fill #0 | Status: CN

## 2021-02-01 MED FILL — Furosemide Tab 20 MG: ORAL | 30 days supply | Qty: 60 | Fill #3 | Status: AC

## 2021-02-01 MED FILL — Ezetimibe Tab 10 MG: ORAL | 90 days supply | Qty: 90 | Fill #0 | Status: AC

## 2021-02-02 ENCOUNTER — Other Ambulatory Visit (HOSPITAL_BASED_OUTPATIENT_CLINIC_OR_DEPARTMENT_OTHER): Payer: Self-pay

## 2021-02-02 ENCOUNTER — Other Ambulatory Visit: Payer: Self-pay | Admitting: Surgery

## 2021-02-02 ENCOUNTER — Other Ambulatory Visit: Payer: Self-pay | Admitting: Family

## 2021-02-02 DIAGNOSIS — I10 Essential (primary) hypertension: Secondary | ICD-10-CM

## 2021-02-02 DIAGNOSIS — K432 Incisional hernia without obstruction or gangrene: Secondary | ICD-10-CM

## 2021-02-03 ENCOUNTER — Encounter: Payer: 59 | Admitting: Registered Nurse

## 2021-02-03 ENCOUNTER — Other Ambulatory Visit (HOSPITAL_BASED_OUTPATIENT_CLINIC_OR_DEPARTMENT_OTHER): Payer: Self-pay

## 2021-02-04 ENCOUNTER — Other Ambulatory Visit: Payer: Self-pay | Admitting: Cardiology

## 2021-02-04 ENCOUNTER — Other Ambulatory Visit: Payer: Self-pay | Admitting: Family

## 2021-02-04 ENCOUNTER — Other Ambulatory Visit (HOSPITAL_BASED_OUTPATIENT_CLINIC_OR_DEPARTMENT_OTHER): Payer: Self-pay

## 2021-02-04 DIAGNOSIS — I10 Essential (primary) hypertension: Secondary | ICD-10-CM

## 2021-02-04 DIAGNOSIS — E119 Type 2 diabetes mellitus without complications: Secondary | ICD-10-CM

## 2021-02-04 MED ORDER — LISINOPRIL 2.5 MG PO TABS
ORAL_TABLET | ORAL | 3 refills | Status: DC
  Filled 2021-02-04: qty 90, 90d supply, fill #0
  Filled 2021-05-11: qty 90, 90d supply, fill #1

## 2021-02-05 ENCOUNTER — Other Ambulatory Visit (HOSPITAL_COMMUNITY): Payer: Self-pay

## 2021-02-05 ENCOUNTER — Other Ambulatory Visit: Payer: Self-pay | Admitting: Physician Assistant

## 2021-02-05 DIAGNOSIS — F5101 Primary insomnia: Secondary | ICD-10-CM

## 2021-02-08 ENCOUNTER — Telehealth: Payer: Self-pay | Admitting: Internal Medicine

## 2021-02-08 ENCOUNTER — Other Ambulatory Visit: Payer: Self-pay | Admitting: Internal Medicine

## 2021-02-08 ENCOUNTER — Other Ambulatory Visit (HOSPITAL_COMMUNITY): Payer: Self-pay

## 2021-02-08 ENCOUNTER — Other Ambulatory Visit (HOSPITAL_BASED_OUTPATIENT_CLINIC_OR_DEPARTMENT_OTHER): Payer: Self-pay

## 2021-02-08 DIAGNOSIS — E119 Type 2 diabetes mellitus without complications: Secondary | ICD-10-CM

## 2021-02-08 MED ORDER — METFORMIN HCL 500 MG PO TABS
ORAL_TABLET | ORAL | 0 refills | Status: DC
Start: 2021-02-08 — End: 2021-02-10
  Filled 2021-02-08: qty 30, 15d supply, fill #0

## 2021-02-08 NOTE — Telephone Encounter (Signed)
Needs a Rf on metformin, used to see Elyn Aquas, he doesn't work @ [employer] any longer as far as I know. CBGs elevated in the 300s. Last A1c 6.0 and wnl renal fx.  Plan, d/w on call RN ER if severe blurred vision, N,V, feeling poorly Send a 2 week supply of metformin  Anderson Malta or Mitzo, please help pt reestablish w/ the practice at [facility]

## 2021-02-09 ENCOUNTER — Other Ambulatory Visit: Payer: Self-pay | Admitting: Registered Nurse

## 2021-02-09 ENCOUNTER — Other Ambulatory Visit (HOSPITAL_BASED_OUTPATIENT_CLINIC_OR_DEPARTMENT_OTHER): Payer: Self-pay

## 2021-02-09 DIAGNOSIS — F5101 Primary insomnia: Secondary | ICD-10-CM

## 2021-02-09 MED ORDER — TRAZODONE HCL 50 MG PO TABS
ORAL_TABLET | ORAL | 1 refills | Status: DC
  Filled 2021-02-09: qty 180, 90d supply, fill #0
  Filled 2021-05-11: qty 180, 90d supply, fill #1

## 2021-02-10 ENCOUNTER — Other Ambulatory Visit (HOSPITAL_COMMUNITY): Payer: Self-pay

## 2021-02-10 ENCOUNTER — Other Ambulatory Visit (HOSPITAL_BASED_OUTPATIENT_CLINIC_OR_DEPARTMENT_OTHER): Payer: Self-pay

## 2021-02-10 ENCOUNTER — Ambulatory Visit: Payer: 59 | Admitting: Registered Nurse

## 2021-02-10 ENCOUNTER — Other Ambulatory Visit: Payer: Self-pay

## 2021-02-10 ENCOUNTER — Encounter: Payer: Self-pay | Admitting: Registered Nurse

## 2021-02-10 VITALS — BP 146/87 | HR 78 | Temp 98.4°F | Resp 18 | Ht 68.0 in | Wt 216.8 lb

## 2021-02-10 DIAGNOSIS — E119 Type 2 diabetes mellitus without complications: Secondary | ICD-10-CM | POA: Diagnosis not present

## 2021-02-10 DIAGNOSIS — F339 Major depressive disorder, recurrent, unspecified: Secondary | ICD-10-CM | POA: Diagnosis not present

## 2021-02-10 DIAGNOSIS — E785 Hyperlipidemia, unspecified: Secondary | ICD-10-CM | POA: Diagnosis not present

## 2021-02-10 DIAGNOSIS — I10 Essential (primary) hypertension: Secondary | ICD-10-CM

## 2021-02-10 LAB — TSH: TSH: 0.4 u[IU]/mL (ref 0.35–4.50)

## 2021-02-10 LAB — LIPID PANEL
Cholesterol: 156 mg/dL (ref 0–200)
HDL: 28.9 mg/dL — ABNORMAL LOW (ref >39.00)
LDL Cholesterol: 92 mg/dL (ref 0–99)
NonHDL: 126.9
Total CHOL/HDL Ratio: 5
Triglycerides: 173 mg/dL — ABNORMAL HIGH (ref 0.0–149.0)
VLDL: 34.6 mg/dL (ref 0.0–40.0)

## 2021-02-10 LAB — CBC WITH DIFFERENTIAL/PLATELET
Basophils Absolute: 0 10*3/uL (ref 0.0–0.1)
Basophils Relative: 0.6 % (ref 0.0–3.0)
Eosinophils Absolute: 0.1 10*3/uL (ref 0.0–0.7)
Eosinophils Relative: 1.1 % (ref 0.0–5.0)
HCT: 44.2 % (ref 39.0–52.0)
Hemoglobin: 15.1 g/dL (ref 13.0–17.0)
Lymphocytes Relative: 22.4 % (ref 12.0–46.0)
Lymphs Abs: 1.5 10*3/uL (ref 0.7–4.0)
MCHC: 34.2 g/dL (ref 30.0–36.0)
MCV: 82.8 fl (ref 78.0–100.0)
Monocytes Absolute: 0.3 10*3/uL (ref 0.1–1.0)
Monocytes Relative: 4.9 % (ref 3.0–12.0)
Neutro Abs: 4.9 10*3/uL (ref 1.4–7.7)
Neutrophils Relative %: 71 % (ref 43.0–77.0)
Platelets: 169 10*3/uL (ref 150.0–400.0)
RBC: 5.34 Mil/uL (ref 4.22–5.81)
RDW: 14 % (ref 11.5–15.5)
WBC: 6.9 10*3/uL (ref 4.0–10.5)

## 2021-02-10 LAB — COMPREHENSIVE METABOLIC PANEL
ALT: 25 U/L (ref 0–53)
AST: 17 U/L (ref 0–37)
Albumin: 4.2 g/dL (ref 3.5–5.2)
Alkaline Phosphatase: 80 U/L (ref 39–117)
BUN: 15 mg/dL (ref 6–23)
CO2: 23 mEq/L (ref 19–32)
Calcium: 9 mg/dL (ref 8.4–10.5)
Chloride: 104 mEq/L (ref 96–112)
Creatinine, Ser: 0.9 mg/dL (ref 0.40–1.50)
GFR: 90.23 mL/min (ref >60.00)
Glucose, Bld: 133 mg/dL — ABNORMAL HIGH (ref 70–99)
Potassium: 4.2 mEq/L (ref 3.5–5.1)
Sodium: 137 mEq/L (ref 135–145)
Total Bilirubin: 0.7 mg/dL (ref 0.2–1.2)
Total Protein: 6.6 g/dL (ref 6.0–8.3)

## 2021-02-10 LAB — HEMOGLOBIN A1C: Hgb A1c MFr Bld: 6.8 % — ABNORMAL HIGH (ref 4.6–6.5)

## 2021-02-10 MED ORDER — ARIPIPRAZOLE 2 MG PO TABS
2.0000 mg | ORAL_TABLET | Freq: Every day | ORAL | 0 refills | Status: DC
  Filled 2021-02-10: qty 90, 90d supply, fill #0

## 2021-02-10 MED ORDER — METFORMIN HCL 500 MG PO TABS
ORAL_TABLET | ORAL | 0 refills | Status: DC
  Filled 2021-02-10: qty 180, fill #0

## 2021-02-10 MED ORDER — BUPROPION HCL ER (XL) 150 MG PO TB24
ORAL_TABLET | ORAL | 3 refills | Status: DC
  Filled 2021-02-10: qty 270, 90d supply, fill #0

## 2021-02-10 NOTE — Progress Notes (Addendum)
Established Patient Office Visit  Subjective:  Patient ID: Shaun [name], male    DOB: Jan 03, 1956  Age: 65 y.o. MRN: [id number]  CC:  Chief Complaint  Patient presents with   Medication Refill    Patient states he is here for a medication refill and discus medications.    HPI Shaun [name] presents for med refill  Formerly a patient of Elyn Aquas, [place].   Depression Has been on wellbutrin 450mg  XL PO qd and escitalopram 20mg  po qd Reports diminishing effect. No Aes. Hoping for something to help Denies hi/si Continues trazodone 25-50mg  PO qhs prn for sleep  T2dm Has run out of metformin Last a1c was 6.0 on 04/28/20 No new symptoms of complications Would like to continue current regimen  Past Medical History:  Diagnosis Date   Anemia    Anxiety    Arteriosclerotic cardiovascular disease (ASCVD)    Nonobstructive; cath in 2000: 30-40% mid LAD and proximal RCA;normal EF. stress nuclear in 2006-subtle inferoseptal hypoperfusion with reversibility; negative stress EKG; good exercise tolerance   Asthma    Cancer (HCC)    skin cancer   Cataract    CHF (congestive heart failure) (HCC)    Colon polyps    30 colon polyps found on first colonoscopy   COPD (chronic obstructive pulmonary disease) (HCC)    Diabetes mellitus without complication (HCC)    Diverticulitis    DJD (degenerative joint disease)    GERD (gastroesophageal reflux disease)    History of kidney stones    Hyperlipidemia    Hypertension    Insomnia    Obstructive sleep apnea 12/2009   01/26/2010 AHI 83/hr   Paroxysmal atrial fibrillation ([place]) 10/2004   Onset in 10/2004; recurred 09/2008   PUD (peptic ulcer disease)    1980s   RLS (restless legs syndrome)    Sinusitis     Past Surgical History:  Procedure Laterality Date   BIOPSY  07/17/2018   Procedure: BIOPSY;  Surgeon: Danie Binder, MD;  [facility];  Service: Endoscopy;;  colon   BOWEL RESECTION  09/17/2018   SMALL BOWEL  RESECTION: 71 CM    CARDIAC CATHETERIZATION N/A 07/21/2015   Procedure: Left Heart Cath and Coronary Angiography;  Surgeon: Peter M Martinique, MD;  Location: [facility];  Service: Cardiovascular;  Laterality: N/A;   CARDIAC CATHETERIZATION N/A 07/21/2015   Procedure: Coronary Stent Intervention;  Surgeon: Peter M Martinique, MD;  Location: [place] [facility];  Service: Cardiovascular;  Laterality: N/A;   CIRCUMCISION N/A 04/05/2019   Procedure: CIRCUMCISION ADULT;  Surgeon: Irine Seal, MD;  Location: [facility];  Service: Urology;  Laterality: N/A;   COLONOSCOPY N/A 05/19/2014   Dr. Barnie Alderman diverticulosis/moderate external hemorrhoids, >20 simple adenomas. Genetic screening negative.    COLONOSCOPY WITH PROPOFOL N/A 07/17/2018   Dr. Oneida Alar: Diverticulosis, external/internal hemorrhoids, 32 colon polyps removed.  ten tubular adenomas removed with no high-grade dysplasia.  Advised to have surveillance colonoscopy in 3 years.   ESOPHAGOGASTRODUODENOSCOPY (EGD) WITH PROPOFOL N/A 07/17/2018   Dr. Oneida Alar: Low-grade narrowing Schatzki ring at the GE junction status post dilation.  Gastritis.  Biopsy with mild nonspecific reactive gastropathy.  No H. pylori.   GIVENS CAPSULE STUDY N/A 06/24/2019   normal   HERNIA REPAIR  1986   Left inguinal   INTRAVASCULAR PRESSURE WIRE/FFR STUDY Left 06/08/2017   Procedure: INTRAVASCULAR PRESSURE WIRE/FFR STUDY;  Surgeon: Nelva Bush, MD;  Location: [place] [facility];  Service: Cardiovascular;  Laterality:  Left;  LAD and CFX   LAPAROTOMY N/A 09/17/2018   Procedure: EXPLORATORY LAPAROTOMY;  Surgeon: Virl Cagey, MD;  Location: [facility];  Service: General;  Laterality: N/A;   LEFT HEART CATH AND CORONARY ANGIOGRAPHY N/A 06/08/2017   Procedure: LEFT HEART CATH AND CORONARY ANGIOGRAPHY;  Surgeon: Nelva Bush, MD;  Location: [facility];  Service: Cardiovascular;  Laterality: N/A;   POLYPECTOMY  07/17/2018   Procedure: POLYPECTOMY;  Surgeon: Danie Binder, MD;  [facility];  Service: Endoscopy;;  colon   ROTATOR CUFF REPAIR     Right   SAVORY DILATION N/A 07/17/2018   Procedure: SAVORY DILATION;  Surgeon: Danie Binder, MD;  [facility];  Service: Endoscopy;  Laterality: N/A;    Family History  Problem Relation Age of Onset   Hypertension Mother    Breast cancer Mother 40       brain/bone    Heart attack Father    Diabetes Brother    Skin cancer Sister 52   Brain cancer Maternal Uncle    Cancer Maternal Uncle        NOS   Breast cancer Cousin        maternal cousin dx <50   Cancer Cousin    Colon cancer Neg Hx     Social History   Socioeconomic History   Marital status: Married    Spouse name: Not on file   Number of children: 5   Years of education: Not on file   Highest education level: Not on file  Occupational History   Occupation: employed    Employer: [employer]: full-time  Tobacco Use   Smoking status: Every Day    Packs/day: 0.50    Years: 39.00    Pack years: 19.50    Types: Cigarettes    Start date: 07/24/1970   Smokeless tobacco: Never  Vaping Use   Vaping Use: Never used  Substance and Sexual Activity   Alcohol use: No    Alcohol/week: 0.0 standard drinks   Drug use: No   Sexual activity: Yes    Partners: Female  Other Topics Concern   Not on file  Social History Narrative   Not on file   Social Determinants of Health   Financial Resource Strain: Not on file  Food Insecurity: Not on file  Transportation Needs: Not on file  Physical Activity: Not on file  Stress: Not on file  Social Connections: Not on file  Intimate Partner Violence: Not on file    Outpatient Medications Prior to Visit  Medication Sig Dispense Refill   albuterol (VENTOLIN HFA) 108 (90 Base) MCG/ACT inhaler INHALE 2 PUFFS INTO THE LUNGS EVERY 6 HOURS AS NEEDED FOR WHEEZING OR SHORTNESS OF BREATH 18 g 11   atorvastatin (LIPITOR) 80 MG tablet TAKE 1 TABLET(80 MG) BY MOUTH EVERY  EVENING 90 tablet 0   buPROPion (WELLBUTRIN XL) 150 MG 24 hr tablet Take 3 tablets once daily 270 tablet 0   docusate sodium (COLACE) 100 MG capsule Take 100 mg by mouth as needed for mild constipation.     ELIQUIS 5 MG TABS tablet TAKE 1 TABLET(5 MG) BY MOUTH TWICE DAILY 60 tablet 6   escitalopram (LEXAPRO) 20 MG tablet TAKE 1 TABLET(20 MG) BY MOUTH DAILY 90 tablet 0   ezetimibe (ZETIA) 10 MG tablet TAKE 1 TABLET BY MOUTH EVERY DAY 330 tablet 0   ferrous sulfate 325 (65 FE) MG tablet Take 325  mg by mouth daily with breakfast.     furosemide (LASIX) 20 MG tablet TAKE 2 TABLETS BY MOUTH DAILY 30 tablet 8   furosemide (LASIX) 20 MG tablet TAKE 2 TABLETS BY MOUTH DAILY 30 tablet 0   lisinopril (ZESTRIL) 2.5 MG tablet TAKE 1 TABLET(2.5 MG) BY MOUTH DAILY. 90 tablet 3   meclizine (ANTIVERT) 25 MG tablet Take 1 tablet (25 mg total) by mouth 3 (three) times daily as needed for dizziness. 30 tablet 0   metFORMIN (GLUCOPHAGE) 500 MG tablet TAKE ONE TABLET (500 MG) BY MOUTH TWICE DAILY WITH A MEAL. 30 tablet 0   metoprolol tartrate (LOPRESSOR) 100 MG tablet Take 1 tablet (100 mg total) by mouth 2 (two) times daily. 180 tablet 3   nitroGLYCERIN (NITROSTAT) 0.4 MG SL tablet Place 1 tablet (0.4 mg total) under the tongue every 5 (five) minutes as needed. 25 tablet 3   omeprazole (PRILOSEC) 40 MG capsule TAKE 1 CAPSULE(40 MG) BY MOUTH DAILY 30 minutes before breakfast 90 capsule 3   ondansetron (ZOFRAN) 4 MG tablet Take 1 tablet (4 mg total) by mouth every 8 (eight) hours as needed for nausea or vomiting. 30 tablet 1   potassium chloride SA (KLOR-CON) 20 MEQ tablet Take 1 tablet (20 mEq total) by mouth daily. 90 tablet 3   rOPINIRole (REQUIP) 0.25 MG tablet TAKE 1 TABLET BY MOUTH EVERY NIGHT AT BEDTIME (WHEN NEEDED FOR BAD NIGHTS USE 3MG  TABLET) 30 tablet 0   rOPINIRole (REQUIP) 3 MG tablet TAKE 1 TABLET BY MOUTH EVERY NIGHT AT BEDTIME 450 tablet 0   sildenafil (VIAGRA) 100 MG tablet TAKE 1 TABLET BY MOUTH 30  MINUTES BEFORE ACTIVITY 2 tablet 0   sildenafil (VIAGRA) 100 MG tablet TAKE 1 TABLET BY MOUTH 30 MINUTES BEFORE ACTIVITY 25 tablet 0   traMADol (ULTRAM) 50 MG tablet TAKE 1 TABLET BY MOUTH TWICE DAILY AS NEEDED FOR MODERATE PAIN. 60 tablet 4   traZODone (DESYREL) 50 MG tablet TAKE 2 TABLETS (100 MG) BY MOUTH AT BEDTIME 180 tablet 1   triamcinolone cream (KENALOG) 0.1 % Apply 1 application topically 2 (two) times daily. (Patient taking differently: Apply 1 application topically 2 (two) times daily. Pt takes as needed.) 60 g 1   sodium chloride (OCEAN) 0.65 % SOLN nasal spray Place 1 spray into both nostrils daily as needed for congestion.  (Patient not taking: Reported on 02/10/2021)     sucralfate (CARAFATE) 1 g tablet Take one tablet po BID PRN (Patient not taking: Reported on 02/10/2021) 42 tablet 5   tetrahydrozoline 0.05 % ophthalmic solution Place 2 drops into both eyes daily as needed (for dry eyes). (Patient not taking: Reported on 02/10/2021)     No facility-administered medications prior to visit.    Not on File  ROS Review of Systems    Objective:    Physical Exam Constitutional:      General: He is not in acute distress.    Appearance: Normal appearance. He is normal weight. He is not ill-appearing, toxic-appearing or diaphoretic.  Cardiovascular:     Rate and Rhythm: Normal rate and regular rhythm.     Heart sounds: Normal heart sounds. No murmur heard.   No friction rub. No gallop.  Pulmonary:     Effort: Pulmonary effort is normal. No respiratory distress.     Breath sounds: Normal breath sounds. No stridor. No wheezing, rhonchi or rales.  Chest:     Chest wall: No tenderness.  Neurological:     General:  No focal deficit present.     Mental Status: He is alert and oriented to person, place, and time. Mental status is at baseline.  Psychiatric:        Mood and Affect: Mood normal.        Behavior: Behavior normal.        Thought Content: Thought content normal.         Judgment: Judgment normal.    BP (!) 146/87   Pulse 78   Temp 98.4 F (36.9 C) (Temporal)   Resp 18   Ht 5\' 8"  (1.727 m)   Wt 216 lb 12.8 oz (98.3 kg)   SpO2 99%   BMI 32.96 kg/m  Wt Readings from Last 3 Encounters:  02/10/21 216 lb 12.8 oz (98.3 kg)  12/31/20 216 lb 12.8 oz (98.3 kg)  10/13/20 217 lb (98.4 kg)     Health Maintenance Due  Topic Date Due   HEMOGLOBIN A1C  10/26/2020    There are no preventive care reminders to display for this patient.  Lab Results  Component Value Date   TSH 0.550 02/03/2011   Lab Results  Component Value Date   WBC 8.6 05/26/2020   HGB 14.0 05/26/2020   HCT 41.6 05/26/2020   MCV 83.2 05/26/2020   PLT 168.0 05/26/2020   Lab Results  Component Value Date   NA 138 05/26/2020   K 4.2 05/26/2020   CO2 24 05/26/2020   GLUCOSE 94 05/26/2020   BUN 14 05/26/2020   CREATININE 1.05 05/26/2020   BILITOT 0.5 10/29/2020   ALKPHOS 100 10/29/2020   AST 18 10/29/2020   ALT 26 10/29/2020   PROT 6.8 10/29/2020   ALBUMIN 4.4 10/29/2020   CALCIUM 8.9 05/26/2020   ANIONGAP 9 04/03/2019   GFR 71.15 05/26/2020   Lab Results  Component Value Date   CHOL 115 10/29/2020   Lab Results  Component Value Date   HDL 29 (L) 10/29/2020   Lab Results  Component Value Date   LDLCALC 58 10/29/2020   Lab Results  Component Value Date   TRIG 162 (H) 10/29/2020   Lab Results  Component Value Date   CHOLHDL 4.0 10/29/2020   Lab Results  Component Value Date   HGBA1C 6.0 (A) 04/28/2020      Assessment & Plan:   Problem List Items Addressed This Visit       Cardiovascular and Mediastinum   Essential hypertension   Relevant Orders   CBC with Differential/Platelet (Completed)   Comprehensive metabolic panel (Completed)   Hemoglobin A1c (Completed)   TSH (Completed)     Endocrine   Non-insulin treated type 2 diabetes mellitus ([place])     Other   Depression - Primary   Relevant Medications   ARIPiprazole (ABILIFY) 2 MG tablet    buPROPion (WELLBUTRIN XL) 150 MG 24 hr tablet   Other Visit Diagnoses     Hyperlipidemia, unspecified hyperlipidemia type       Relevant Orders   Lipid panel (Completed)       Meds ordered this encounter  Medications   ARIPiprazole (ABILIFY) 2 MG tablet    Sig: Take 1 tablet (2 mg total) by mouth daily.    Dispense:  90 tablet    Refill:  0    Order Specific Question:   Supervising Provider    Answer:   Carlota Raspberry, JEFFREY R [[id number]]   buPROPion (WELLBUTRIN XL) 150 MG 24 hr tablet    Sig: Take 3 tablets once daily  Dispense:  270 tablet    Refill:  3    Cancel wellbutrin 150mg  sr one bid    Order Specific Question:   Supervising Provider    Answer:   Carlota Raspberry, JEFFREY R [[id number]]   DISCONTD: metFORMIN (GLUCOPHAGE) 500 MG tablet    Sig: TAKE ONE TABLET (500 MG) BY MOUTH TWICE DAILY WITH A MEAL.    Dispense:  180 tablet    Refill:  0    Order Specific Question:   Supervising Provider    Answer:   Carlota Raspberry, JEFFREY R [[id number]]    Follow-up: Return in about 6 months (around 08/12/2021) for chronic conditions.   PLAN Refill metformin for t2dm Labs collected. Will follow up with the patient as warranted. Add 2mg  abilify to regimen for worsening depression. May help boost effect of wellbutrin. Discussed risks, benefits, alternatives with patient who voices understanding Med check at [facility] in August Follow up in 6 mo for other chronic conditions Patient encouraged to call clinic with any questions, comments, or concerns. Maximiano Coss, NP

## 2021-02-10 NOTE — Patient Instructions (Addendum)
Shaun [name] -   Doristine Devoid to meet you. Med refills have been sent  Let me know if anything gets worse or doesn't get better.  Aripiprazole can help the wellbutrin and lexapro have better effect. If you have side effects, stop this medication and let me know  Let's plan on following up in 6 mo at the latest on these labs. Let's keep your appt with me in August to check in on the Aripiprazole and it's effect.   Thank you  Shaun [name]    If you have lab work done today you will be contacted with your lab results within the next 2 weeks.  If you have not heard from [place] then please contact us. The fastest way to get your results is to register for My Chart.   IF you received an x-ray today, you will receive an invoice from [facility]. Please contact [facility] at [phone] with questions or concerns regarding your invoice.   IF you received labwork today, you will receive an invoice from [facility]. Please contact [facility] at [phone] with questions or concerns regarding your invoice.   Our billing staff will not be able to assist you with questions regarding bills from these companies.  You will be contacted with the lab results as soon as they are available. The fastest way to get your results is to activate your My Chart account. Instructions are located on the last page of this paperwork. If you have not heard from [place] regarding the results in 2 weeks, please contact this office.

## 2021-02-11 ENCOUNTER — Other Ambulatory Visit (HOSPITAL_BASED_OUTPATIENT_CLINIC_OR_DEPARTMENT_OTHER): Payer: Self-pay

## 2021-02-26 ENCOUNTER — Other Ambulatory Visit (HOSPITAL_BASED_OUTPATIENT_CLINIC_OR_DEPARTMENT_OTHER): Payer: Self-pay

## 2021-02-26 ENCOUNTER — Telehealth: Payer: Self-pay

## 2021-02-26 ENCOUNTER — Other Ambulatory Visit: Payer: Self-pay

## 2021-02-26 DIAGNOSIS — E119 Type 2 diabetes mellitus without complications: Secondary | ICD-10-CM

## 2021-02-26 MED ORDER — METFORMIN HCL 500 MG PO TABS
ORAL_TABLET | ORAL | 0 refills | Status: DC
  Filled 2021-02-26: qty 180, 90d supply, fill #0

## 2021-02-26 NOTE — Telephone Encounter (Signed)
Medication sent to pharmacy  

## 2021-02-26 NOTE — Telephone Encounter (Signed)
Pt needs refill on metFORMIN (GLUCOPHAGE) 500 MG tablet [place] at [facility]   Pt call back [phone]

## 2021-03-03 ENCOUNTER — Other Ambulatory Visit (HOSPITAL_BASED_OUTPATIENT_CLINIC_OR_DEPARTMENT_OTHER): Payer: Self-pay

## 2021-03-03 ENCOUNTER — Other Ambulatory Visit: Payer: Self-pay | Admitting: Cardiology

## 2021-03-03 MED FILL — Furosemide Tab 20 MG: ORAL | 15 days supply | Qty: 30 | Fill #4 | Status: AC

## 2021-03-03 NOTE — Telephone Encounter (Signed)
Will route back to the requesting surgeon's office to see if they have scheduled her CT, that we are waiting on that in order to see if pt will be cleared for surgery.

## 2021-03-03 NOTE — Telephone Encounter (Addendum)
   Patient Name: Shaun [name]  DOB: [birth year]  MRN: [id number]   Primary Cardiologist: Kirk Ruths, MD  Chart reviewed as part of pre-op protocol, coming on board the pool today. As outlined below, patient is awaiting CT abdomen/pelvis as ordered back in June by surgical team. Will route to callback to reach out to ordering provider (general surgery team) to ensure this is pending scheduling. Do not see it scheduled yet, just "ready for scheduling." Thanks.   FYI to preop team following -I did speak with Dr. Stanford Breed whether aforementioned CT would be able to eval aorta as suggested. He recommends we follow for result and if aorta size unable to be quantified by this study, consider alternative imaging.  Charlie Pitter, PA-C 03/03/2021, 10:41 AM

## 2021-03-04 ENCOUNTER — Other Ambulatory Visit (HOSPITAL_BASED_OUTPATIENT_CLINIC_OR_DEPARTMENT_OTHER): Payer: Self-pay

## 2021-03-04 MED ORDER — FUROSEMIDE 20 MG PO TABS
ORAL_TABLET | Freq: Every day | ORAL | 0 refills | Status: DC
  Filled 2021-03-04: qty 30, fill #0
  Filled 2021-04-20: qty 30, 30d supply, fill #0

## 2021-03-10 ENCOUNTER — Other Ambulatory Visit (HOSPITAL_BASED_OUTPATIENT_CLINIC_OR_DEPARTMENT_OTHER): Payer: Self-pay

## 2021-03-10 ENCOUNTER — Other Ambulatory Visit (HOSPITAL_COMMUNITY): Payer: Self-pay

## 2021-03-10 NOTE — Telephone Encounter (Signed)
CT has been scheduled for 03/22/2021.  Will route back to preop pool to make them aware.

## 2021-03-11 ENCOUNTER — Other Ambulatory Visit (HOSPITAL_COMMUNITY): Payer: Self-pay

## 2021-03-17 ENCOUNTER — Other Ambulatory Visit: Payer: Self-pay | Admitting: Family Medicine

## 2021-03-17 ENCOUNTER — Other Ambulatory Visit (HOSPITAL_BASED_OUTPATIENT_CLINIC_OR_DEPARTMENT_OTHER): Payer: Self-pay

## 2021-03-17 ENCOUNTER — Other Ambulatory Visit: Payer: Self-pay | Admitting: Registered Nurse

## 2021-03-17 MED ORDER — ROPINIROLE HCL 3 MG PO TABS
ORAL_TABLET | Freq: Every day | ORAL | 0 refills | Status: DC
  Filled 2021-03-17: qty 90, 90d supply, fill #0
  Filled 2021-06-14: qty 90, 90d supply, fill #1
  Filled 2021-09-29: qty 90, 90d supply, fill #2
  Filled 2021-12-28: qty 90, 90d supply, fill #3

## 2021-03-18 ENCOUNTER — Other Ambulatory Visit (HOSPITAL_BASED_OUTPATIENT_CLINIC_OR_DEPARTMENT_OTHER): Payer: Self-pay

## 2021-03-18 ENCOUNTER — Telehealth: Payer: Self-pay | Admitting: Registered Nurse

## 2021-03-18 NOTE — Telephone Encounter (Signed)
Medication was already sent to the pharmacy

## 2021-03-18 NOTE — Telephone Encounter (Signed)
..  Medication Refills  Last OV:  Medication:  ropinirole  Pharmacy:  [facility]  Let patient know to contact pharmacy at the end of the day to make sure medication is ready.   Please notify patient to allow 48-72 hours to process.  Encourage patient to contact the pharmacy for refills or they can request refills through [facility] out below:   Last refill:  QTY:  Refill Date:    Other Comments:   Okay for refill?  Please advise.

## 2021-03-19 ENCOUNTER — Other Ambulatory Visit: Payer: Self-pay | Admitting: Cardiology

## 2021-03-19 ENCOUNTER — Other Ambulatory Visit (HOSPITAL_BASED_OUTPATIENT_CLINIC_OR_DEPARTMENT_OTHER): Payer: Self-pay

## 2021-03-19 DIAGNOSIS — I5032 Chronic diastolic (congestive) heart failure: Secondary | ICD-10-CM

## 2021-03-19 MED ORDER — FUROSEMIDE 20 MG PO TABS
ORAL_TABLET | Freq: Every day | ORAL | 8 refills | Status: DC
  Filled 2021-03-19: qty 60, 30d supply, fill #0

## 2021-03-22 ENCOUNTER — Other Ambulatory Visit: Payer: Self-pay

## 2021-03-22 ENCOUNTER — Ambulatory Visit
Admission: RE | Admit: 2021-03-22 | Discharge: 2021-03-22 | Disposition: A | Discharge status: Home / Self Care | Payer: 59 | Source: Ambulatory Visit | Attending: Surgery | Admitting: Surgery

## 2021-03-22 DIAGNOSIS — K5730 Diverticulosis of large intestine without perforation or abscess without bleeding: Secondary | ICD-10-CM | POA: Diagnosis not present

## 2021-03-22 DIAGNOSIS — N281 Cyst of kidney, acquired: Secondary | ICD-10-CM | POA: Diagnosis not present

## 2021-03-22 DIAGNOSIS — K432 Incisional hernia without obstruction or gangrene: Secondary | ICD-10-CM

## 2021-03-22 DIAGNOSIS — K8020 Calculus of gallbladder without cholecystitis without obstruction: Secondary | ICD-10-CM | POA: Diagnosis not present

## 2021-03-23 ENCOUNTER — Telehealth: Payer: Self-pay

## 2021-03-23 NOTE — Telephone Encounter (Signed)
   CT performed, results reviewed with Dr Maree Erie. Although not in the original report, he was able to size the aorta at 3.1 cm for the thoracic aorta and 2.3 cm for the abdominal aorta. No aneurysmal dilatation seen.   Will route to J. Cleaver, NP, he will address.  Rosaria Ferries, PA-C 03/23/2021 1:19 PM    CT ABDOMEN PELVIS WO CONTRAST  Result Date: 03/23/2021 CLINICAL DATA:  Right ventral incisional hernia a few years ago with worsening pain. History of bowel resection. History of inguinal hernia repair. EXAM: CT ABDOMEN AND PELVIS WITHOUT CONTRAST TECHNIQUE: Multidetector CT imaging of the abdomen and pelvis was performed following the standard protocol without IV contrast. COMPARISON:  09/17/2018 FINDINGS: Lower chest: Lung bases are clear except for mild scarring. No pleural effusion. Hepatobiliary: Small calcified gallstones dependent in the gallbladder similar to the previous study. 13 mm cyst at the dome of the liver, smaller than on the previous study. 5.4 cm cyst the lateral edge of lateral segment of the left lobe, enlarged since the previous study. Pancreas: Normal Spleen: Normal Adrenals/Urinary Tract: Adrenal glands are normal. Right kidney is normal. Left renal cysts as seen previously, the largest projecting medially measuring 4.4 cm in size, slightly enlarged since the prior study. No obstruction. Parapelvic cyst previously seen has gotten smaller. Stomach/Bowel: Stomach appears normal. Previous small bowel anastomosis without complicating feature. Diverticulosis of the colon without evidence of diverticulitis. Ventral hernia defect adjacent to the umbilicus with the defect itself measuring 5 cm. The hernia contains fat and small intestine but there is no evidence of incarceration. There are bilateral inguinal hernias containing only fat, larger on the right than the left. Vascular/Lymphatic: Aortic atherosclerosis. No aneurysm. IVC is normal. No retroperitoneal adenopathy.  Reproductive: Normal Other: No free fluid or air. Musculoskeletal: Ordinary lower lumbar degenerative changes. Degenerative arthritis the hips, right worse than left. IMPRESSION: Periumbilical ventral hernia with the actual defect measuring 5 cm. The hernia contains small intestine and mesentery. No evidence of obstruction/incarceration. Previous small bowel anastomosis without complicating feature by CT. Bilateral inguinal hernias containing fat, larger on the right than the left. Chololithiasis without CT evidence of cholecystitis or obstruction. Liver and renal cysts as outlined above. Aortic Atherosclerosis (ICD10-I70.0). Lower lumbar degenerative changes. Osteoarthritis of the hips, right worse than left. Electronically Signed   By: Nelson Chimes M.D.   On: 03/23/2021 08:21

## 2021-03-23 NOTE — Telephone Encounter (Signed)
Primary Cardiologist:Brian Stanford Breed, MD  Chart reviewed as part of pre-operative protocol coverage. Because of Shaun [name]'s past medical history and time since last visit, he/she will require a follow-up visit in order to better assess preoperative cardiovascular risk.  Pre-op covering staff: - Please schedule appointment and call patient to inform them. - Please contact requesting surgeon's office via preferred method (i.e, phone, fax) to inform them of need for appointment prior to surgery.  If applicable, this message will also be routed to pharmacy pool and/or primary cardiologist for input on holding anticoagulant/antiplatelet agent as requested below so that this information is available at time of patient's appointment.   Deberah Pelton, NP  03/23/2021, 1:33 PM

## 2021-03-23 NOTE — Telephone Encounter (Signed)
Left message on home and cell phone advising patient to contact the office to schedule a follow up appointment.

## 2021-03-24 NOTE — Telephone Encounter (Signed)
Pt has been scheduled to see Laurann Montana, NP, 05/04/2021.  Will route this back to the requesting surgeon's office to make them aware that surgical clearance will be addressed at that time.

## 2021-03-24 NOTE — Telephone Encounter (Signed)
2nd attempt to reach pt regarding an appointment for surgical clearance.  Left detailed message to call the office back to schedule an appointment.

## 2021-04-01 ENCOUNTER — Other Ambulatory Visit: Payer: Self-pay | Admitting: Cardiology

## 2021-04-01 ENCOUNTER — Other Ambulatory Visit (HOSPITAL_BASED_OUTPATIENT_CLINIC_OR_DEPARTMENT_OTHER): Payer: Self-pay

## 2021-04-01 MED ORDER — POTASSIUM CHLORIDE CRYS ER 20 MEQ PO TBCR
20.0000 meq | EXTENDED_RELEASE_TABLET | Freq: Every day | ORAL | 3 refills | Status: DC
  Filled 2021-04-01: qty 90, 90d supply, fill #0
  Filled 2021-07-20: qty 90, 90d supply, fill #1
  Filled 2021-10-18: qty 90, 90d supply, fill #2
  Filled 2022-01-25: qty 90, 90d supply, fill #3

## 2021-04-07 ENCOUNTER — Other Ambulatory Visit (HOSPITAL_BASED_OUTPATIENT_CLINIC_OR_DEPARTMENT_OTHER): Payer: Self-pay

## 2021-04-08 ENCOUNTER — Other Ambulatory Visit (HOSPITAL_BASED_OUTPATIENT_CLINIC_OR_DEPARTMENT_OTHER): Payer: Self-pay

## 2021-04-08 ENCOUNTER — Other Ambulatory Visit: Payer: Self-pay

## 2021-04-08 ENCOUNTER — Telehealth: Payer: Self-pay

## 2021-04-08 DIAGNOSIS — I4821 Permanent atrial fibrillation: Secondary | ICD-10-CM

## 2021-04-08 MED ORDER — APIXABAN 5 MG PO TABS
ORAL_TABLET | ORAL | 0 refills | Status: DC
  Filled 2021-04-08: qty 180, 90d supply, fill #0

## 2021-04-08 MED ORDER — METOPROLOL TARTRATE 100 MG PO TABS
100.0000 mg | ORAL_TABLET | Freq: Two times a day (BID) | ORAL | 0 refills | Status: DC
  Filled 2021-04-08: qty 180, 90d supply, fill #0

## 2021-04-08 NOTE — Telephone Encounter (Signed)
Pt needs refill on ELIQUIS 5 MG TABS tablet , metoprolol tartrate (LOPRESSOR) 100 MG tablet   Pt wants these RX sent to [facility] at [place] call back [phone]

## 2021-04-08 NOTE — Telephone Encounter (Signed)
Medication sent to pharmacy  

## 2021-04-09 ENCOUNTER — Other Ambulatory Visit (HOSPITAL_BASED_OUTPATIENT_CLINIC_OR_DEPARTMENT_OTHER): Payer: Self-pay

## 2021-04-13 ENCOUNTER — Other Ambulatory Visit: Payer: Self-pay

## 2021-04-13 ENCOUNTER — Encounter: Payer: Self-pay | Admitting: Physical Medicine & Rehabilitation

## 2021-04-13 ENCOUNTER — Ambulatory Visit: Payer: 59 | Admitting: Physical Medicine & Rehabilitation

## 2021-04-13 ENCOUNTER — Encounter: Payer: 59 | Attending: Physical Medicine & Rehabilitation | Admitting: Physical Medicine & Rehabilitation

## 2021-04-13 VITALS — BP 120/76 | HR 75 | Temp 98.2°F | Ht 68.0 in | Wt 211.0 lb

## 2021-04-13 DIAGNOSIS — M1611 Unilateral primary osteoarthritis, right hip: Secondary | ICD-10-CM | POA: Diagnosis not present

## 2021-04-13 MED ORDER — TRAMADOL HCL 50 MG PO TABS: ORAL_TABLET | ORAL | 4 refills | Status: DC

## 2021-04-13 NOTE — Progress Notes (Signed)
Subjective:    Patient ID: Shaun [name], male    DOB: [birth year], 65 y.o.   MRN: [id number]  HPI 65 year old male with severe degenerative arthritis of the right hip. Righ hip intra articular injection is starting to wear off, was vey helpful.  Ambulation distance improved post injection which was last performed 07/16/20.  Started to wear off first of June  Tramadol '50mg'$  BID continues. Pain Inventory Average Pain 5 Pain Right Now 5 My pain is constant, sharp, and stabbing  In the last 24 hours, has pain interfered with the following? General activity 3 Relation with others 3 Enjoyment of life    What TIME of day is your pain at its worst? morning , daytime, and evening Sleep (in general) Fair  Pain is worse with: walking, bending, standing, and some activites Pain improves with: medication and streching out Relief from Meds:  good to fair  Family History  Problem Relation Age of Onset   Hypertension Mother    Breast cancer Mother 78       brain/bone    Heart attack Father    Diabetes Brother    Skin cancer Sister 25   Brain cancer Maternal Uncle    Cancer Maternal Uncle        NOS   Breast cancer Cousin        maternal cousin dx <50   Cancer Cousin    Colon cancer Neg Hx    Social History   Socioeconomic History   Marital status: Married    Spouse name: Not on file   Number of children: 5   Years of education: Not on file   Highest education level: Not on file  Occupational History   Occupation: employed    Employer: [employer]    Comment: full-time  Tobacco Use   Smoking status: Every Day    Packs/day: 0.50    Years: 39.00    Pack years: 19.50    Types: Cigarettes    Start date: 07/24/1970   Smokeless tobacco: Never  Vaping Use   Vaping Use: Never used  Substance and Sexual Activity   Alcohol use: No    Alcohol/week: 0.0 standard drinks   Drug use: No   Sexual activity: Yes    Partners: Female  Other Topics Concern   Not on file  Social  History Narrative   Not on file   Social Determinants of Health   Financial Resource Strain: Not on file  Food Insecurity: Not on file  Transportation Needs: Not on file  Physical Activity: Not on file  Stress: Not on file  Social Connections: Not on file   Past Surgical History:  Procedure Laterality Date   BIOPSY  07/17/2018   Procedure: BIOPSY;  Surgeon: Danie Binder, MD;  [facility];  Service: Endoscopy;;  colon   BOWEL RESECTION  09/17/2018   SMALL BOWEL RESECTION: 71 CM    CARDIAC CATHETERIZATION N/A 07/21/2015   Procedure: Left Heart Cath and Coronary Angiography;  Surgeon: Peter M Martinique, MD;  Location: [place] [facility];  Service: Cardiovascular;  Laterality: N/A;   CARDIAC CATHETERIZATION N/A 07/21/2015   Procedure: Coronary Stent Intervention;  Surgeon: Peter M Martinique, MD;  Location: [place] [facility];  Service: Cardiovascular;  Laterality: N/A;   CIRCUMCISION N/A 04/05/2019   Procedure: CIRCUMCISION ADULT;  Surgeon: Irine Seal, MD;  Location: [facility];  Service: Urology;  Laterality: N/A;   COLONOSCOPY N/A 05/19/2014   Dr. Barnie Alderman diverticulosis/moderate external  hemorrhoids, >20 simple adenomas. Genetic screening negative.    COLONOSCOPY WITH PROPOFOL N/A 07/17/2018   Dr. Oneida Alar: Diverticulosis, external/internal hemorrhoids, 32 colon polyps removed.  ten tubular adenomas removed with no high-grade dysplasia.  Advised to have surveillance colonoscopy in 3 years.   ESOPHAGOGASTRODUODENOSCOPY (EGD) WITH PROPOFOL N/A 07/17/2018   Dr. Oneida Alar: Low-grade narrowing Schatzki ring at the GE junction status post dilation.  Gastritis.  Biopsy with mild nonspecific reactive gastropathy.  No H. pylori.   GIVENS CAPSULE STUDY N/A 06/24/2019   normal   HERNIA REPAIR  1986   Left inguinal   INTRAVASCULAR PRESSURE WIRE/FFR STUDY Left 06/08/2017   Procedure: INTRAVASCULAR PRESSURE WIRE/FFR STUDY;  Surgeon: Nelva Bush, MD;  Location: [place] [facility];  Service:  Cardiovascular;  Laterality: Left;  LAD and CFX   LAPAROTOMY N/A 09/17/2018   Procedure: EXPLORATORY LAPAROTOMY;  Surgeon: Virl Cagey, MD;  Location: [facility];  Service: General;  Laterality: N/A;   LEFT HEART CATH AND CORONARY ANGIOGRAPHY N/A 06/08/2017   Procedure: LEFT HEART CATH AND CORONARY ANGIOGRAPHY;  Surgeon: Nelva Bush, MD;  Location: [place] [facility];  Service: Cardiovascular;  Laterality: N/A;   POLYPECTOMY  07/17/2018   Procedure: POLYPECTOMY;  Surgeon: Danie Binder, MD;  [facility];  Service: Endoscopy;;  colon   ROTATOR CUFF REPAIR     Right   SAVORY DILATION N/A 07/17/2018   Procedure: SAVORY DILATION;  Surgeon: Danie Binder, MD;  [facility];  Service: Endoscopy;  Laterality: N/A;   Past Surgical History:  Procedure Laterality Date   BIOPSY  07/17/2018   Procedure: BIOPSY;  Surgeon: Danie Binder, MD;  [facility];  Service: Endoscopy;;  colon   BOWEL RESECTION  09/17/2018   SMALL BOWEL RESECTION: 71 CM    CARDIAC CATHETERIZATION N/A 07/21/2015   Procedure: Left Heart Cath and Coronary Angiography;  Surgeon: Peter M Martinique, MD;  Location: [place] [facility];  Service: Cardiovascular;  Laterality: N/A;   CARDIAC CATHETERIZATION N/A 07/21/2015   Procedure: Coronary Stent Intervention;  Surgeon: Peter M Martinique, MD;  Location: [place] [facility];  Service: Cardiovascular;  Laterality: N/A;   CIRCUMCISION N/A 04/05/2019   Procedure: CIRCUMCISION ADULT;  Surgeon: Irine Seal, MD;  [facility];  Service: Urology;  Laterality: N/A;   COLONOSCOPY N/A 05/19/2014   Dr. Barnie Alderman diverticulosis/moderate external hemorrhoids, >20 simple adenomas. Genetic screening negative.    COLONOSCOPY WITH PROPOFOL N/A 07/17/2018   Dr. Oneida Alar: Diverticulosis, external/internal hemorrhoids, 32 colon polyps removed.  ten tubular adenomas removed with no high-grade dysplasia.  Advised to have surveillance colonoscopy in 3 years.    ESOPHAGOGASTRODUODENOSCOPY (EGD) WITH PROPOFOL N/A 07/17/2018   Dr. Oneida Alar: Low-grade narrowing Schatzki ring at the GE junction status post dilation.  Gastritis.  Biopsy with mild nonspecific reactive gastropathy.  No H. pylori.   GIVENS CAPSULE STUDY N/A 06/24/2019   normal   HERNIA REPAIR  1986   Left inguinal   INTRAVASCULAR PRESSURE WIRE/FFR STUDY Left 06/08/2017   Procedure: INTRAVASCULAR PRESSURE WIRE/FFR STUDY;  Surgeon: Nelva Bush, MD;  Location: [place] [facility];  Service: Cardiovascular;  Laterality: Left;  LAD and CFX   LAPAROTOMY N/A 09/17/2018   Procedure: EXPLORATORY LAPAROTOMY;  Surgeon: Virl Cagey, MD;  [facility];  Service: General;  Laterality: N/A;   LEFT HEART CATH AND CORONARY ANGIOGRAPHY N/A 06/08/2017   Procedure: LEFT HEART CATH AND CORONARY ANGIOGRAPHY;  Surgeon: Nelva Bush, MD;  Location: [place]  [facility];  Service: Cardiovascular;  Laterality: N/A;   POLYPECTOMY  07/17/2018   Procedure: POLYPECTOMY;  Surgeon: Danie Binder, MD;  [facility];  Service: Endoscopy;;  colon   ROTATOR CUFF REPAIR     Right   SAVORY DILATION N/A 07/17/2018   Procedure: SAVORY DILATION;  Surgeon: Danie Binder, MD;  [facility];  Service: Endoscopy;  Laterality: N/A;   Past Medical History:  Diagnosis Date   Anemia    Anxiety    Arteriosclerotic cardiovascular disease (ASCVD)    Nonobstructive; cath in 2000: 30-40% mid LAD and proximal RCA;normal EF. stress nuclear in 2006-subtle inferoseptal hypoperfusion with reversibility; negative stress EKG; good exercise tolerance   Asthma    Cancer (HCC)    skin cancer   Cataract    CHF (congestive heart failure) (HCC)    Colon polyps    30 colon polyps found on first colonoscopy   COPD (chronic obstructive pulmonary disease) (HCC)    Diabetes mellitus without complication (HCC)    Diverticulitis    DJD (degenerative joint disease)    GERD (gastroesophageal reflux disease)     History of kidney stones    Hyperlipidemia    Hypertension    Insomnia    Obstructive sleep apnea 12/2009   01/26/2010 AHI 83/hr   Paroxysmal atrial fibrillation ([place]) 10/2004   Onset in 10/2004; recurred 09/2008   PUD (peptic ulcer disease)    1980s   RLS (restless legs syndrome)    Sinusitis    BP 120/76   Pulse 75   Temp 98.2 F (36.8 C)   Ht '5\' 8"'$  (1.727 m)   Wt 211 lb (95.7 kg)   SpO2 96%   BMI 32.08 kg/m   Opioid Risk Score:   Fall Risk Score:  `1  Depression screen PHQ 2/9  Depression screen [facility] 2/9 02/10/2021 02/10/2021 06/23/2020 05/01/2020 01/08/2020 06/21/2018 05/11/2017  Decreased Interest 0 0 '1 3 1 2 '$ 0  Down, Depressed, Hopeless 0 - 0 '1 1 2 '$ 0  PHQ - 2 Score 0 0 '1 4 2 4 '$ 0  Altered sleeping 0 - '1 1 1 2 '$ 0  Tired, decreased energy 0 - '1 2 2 2 1  '$ Change in appetite 0 - 0 '2 1 2 1  '$ Feeling bad or failure about yourself  0 - 0 2 1 0 1  Trouble concentrating 0 - 0 '2 1 1 1  '$ Moving slowly or fidgety/restless 0 - 0 2 2 0 0  Suicidal thoughts 0 - 0 0 0 0 0  PHQ-9 Score 0 - '3 15 10 11 4  '$ Difficult doing work/chores Not difficult at all - - Very difficult Somewhat difficult Somewhat difficult Somewhat difficult  Some recent data might be hidden    Review of Systems  Musculoskeletal:        Right Hernia  on abdomen  & right hip  All other systems reviewed and are negative.     Objective:   Physical Exam Vitals and nursing note reviewed.  Constitutional:      Appearance: He is obese.  HENT:     Head: Normocephalic and atraumatic.  Eyes:     Extraocular Movements: Extraocular movements intact.     Conjunctiva/sclera: Conjunctivae normal.     Pupils: Pupils are equal, round, and reactive to light.  Musculoskeletal:     Comments: Limited range of motion right hip with neutral external rotation as well as with extension  Skin:  General: Skin is warm and dry.  Neurological:     Mental Status: He is alert and oriented to person, place, and time.  Psychiatric:         Mood and Affect: Mood normal.        Behavior: Behavior normal.    There is reduced right hip internal and external rotation Normal left hip internal and external rotation Gait is with shortened step length as well as stance phase on the right side        Assessment & Plan:  #1.  Right hip osteoarthritis with chronic pain continue tramadol 50 mg twice daily Repeat right hip intra-articular injection.  This will be under fluoroscopic guidance with contrast enhancement

## 2021-04-16 ENCOUNTER — Other Ambulatory Visit (HOSPITAL_BASED_OUTPATIENT_CLINIC_OR_DEPARTMENT_OTHER): Payer: Self-pay

## 2021-04-16 ENCOUNTER — Other Ambulatory Visit: Payer: Self-pay | Admitting: Physical Medicine & Rehabilitation

## 2021-04-16 MED ORDER — TRAMADOL HCL 50 MG PO TABS
50.0000 mg | ORAL_TABLET | ORAL | 4 refills | Status: DC
  Filled 2021-04-16: qty 60, 30d supply, fill #0

## 2021-04-19 ENCOUNTER — Ambulatory Visit: Payer: 59 | Admitting: Registered Nurse

## 2021-04-19 ENCOUNTER — Other Ambulatory Visit: Payer: Self-pay

## 2021-04-19 ENCOUNTER — Encounter: Payer: Self-pay | Admitting: Registered Nurse

## 2021-04-19 VITALS — BP 118/60 | HR 84 | Temp 98.3°F | Ht 68.0 in | Wt 213.8 lb

## 2021-04-19 DIAGNOSIS — F321 Major depressive disorder, single episode, moderate: Secondary | ICD-10-CM | POA: Diagnosis not present

## 2021-04-19 DIAGNOSIS — I482 Chronic atrial fibrillation, unspecified: Secondary | ICD-10-CM

## 2021-04-19 DIAGNOSIS — I10 Essential (primary) hypertension: Secondary | ICD-10-CM

## 2021-04-19 DIAGNOSIS — E119 Type 2 diabetes mellitus without complications: Secondary | ICD-10-CM

## 2021-04-19 DIAGNOSIS — R251 Tremor, unspecified: Secondary | ICD-10-CM

## 2021-04-19 NOTE — Patient Instructions (Addendum)
Mr. Duncanson -   Doristine Devoid to see you  Labs stable enough - no need to repeat until next visit.  Referral to neurology placed. I will see if I can send you some info on Paraquat and ties to movement disorders like Parkinson's on MyChart. Tremor is present but no other major red flags today.   See you in 6 mo  Thank you  Rich

## 2021-04-19 NOTE — Progress Notes (Signed)
Established Patient Office Visit  Subjective:  Patient ID: Shaun [name], male    DOB: 11/12/1955  Age: 65 y.o. MRN: [id number]  CC:  Chief Complaint  Patient presents with   Transitions Of Care    From [facility]   Tremors    Hands/lower jaw; Pt complains of worsening symptoms after 2 years. He denies pain, but it is constant. He states that he cannot hold a cup steady. It has become very bothersome.     HPI Shaun [name] presents for transfer of care from [facility]  Tremor Ongoing for two years At rest and with intention Not assoc with any spinal or head injury, change in meds, or other changes Mostly in hands bilaterally, now affecting jaw as well Not as much lower extremities Trouble holding cups, woodworking  Of note, a lot of exposure to Paraquat in the 80s and 90s. Established connection with Parkinson's Disease.  Mental Health Improved with abilify. Stable No new concerns.    Past Medical History:  Diagnosis Date   Anemia    Anxiety    Arteriosclerotic cardiovascular disease (ASCVD)    Nonobstructive; cath in 2000: 30-40% mid LAD and proximal RCA;normal EF. stress nuclear in 2006-subtle inferoseptal hypoperfusion with reversibility; negative stress EKG; good exercise tolerance   Asthma    Cancer (HCC)    skin cancer   Cataract    CHF (congestive heart failure) (HCC)    Colon polyps    30 colon polyps found on first colonoscopy   COPD (chronic obstructive pulmonary disease) (HCC)    Diabetes mellitus without complication (HCC)    Diverticulitis    DJD (degenerative joint disease)    GERD (gastroesophageal reflux disease)    History of kidney stones    Hyperlipidemia    Hypertension    Insomnia    Obstructive sleep apnea 12/2009   01/26/2010 AHI 83/hr   Paroxysmal atrial fibrillation ([place]) 10/2004   Onset in 10/2004; recurred 09/2008   PUD (peptic ulcer disease)    1980s   RLS (restless legs syndrome)    Sinusitis     Past Surgical History:   Procedure Laterality Date   BIOPSY  07/17/2018   Procedure: BIOPSY;  Surgeon: Danie Binder, MD;  [facility];  Service: Endoscopy;;  colon   BOWEL RESECTION  09/17/2018   SMALL BOWEL RESECTION: 71 CM    CARDIAC CATHETERIZATION N/A 07/21/2015   Procedure: Left Heart Cath and Coronary Angiography;  Surgeon: Peter M Martinique, MD;  Location: [place] [facility];  Service: Cardiovascular;  Laterality: N/A;   CARDIAC CATHETERIZATION N/A 07/21/2015   Procedure: Coronary Stent Intervention;  Surgeon: Peter M Martinique, MD;  Location: [place] [facility];  Service: Cardiovascular;  Laterality: N/A;   CIRCUMCISION N/A 04/05/2019   Procedure: CIRCUMCISION ADULT;  Surgeon: Irine Seal, MD;  Location[facility];  Service: Urology;  Laterality: N/A;   COLONOSCOPY N/A 05/19/2014   Dr. Barnie Alderman diverticulosis/moderate external hemorrhoids, >20 simple adenomas. Genetic screening negative.    COLONOSCOPY WITH PROPOFOL N/A 07/17/2018   Dr. Oneida Alar: Diverticulosis, external/internal hemorrhoids, 32 colon polyps removed.  ten tubular adenomas removed with no high-grade dysplasia.  Advised to have surveillance colonoscopy in 3 years.   ESOPHAGOGASTRODUODENOSCOPY (EGD) WITH PROPOFOL N/A 07/17/2018   Dr. Oneida Alar: Low-grade narrowing Schatzki ring at the GE junction status post dilation.  Gastritis.  Biopsy with mild nonspecific reactive gastropathy.  No H. pylori.   GIVENS CAPSULE STUDY N/A 06/24/2019   normal  HERNIA REPAIR  1986   Left inguinal   INTRAVASCULAR PRESSURE WIRE/FFR STUDY Left 06/08/2017   Procedure: INTRAVASCULAR PRESSURE WIRE/FFR STUDY;  Surgeon: Nelva Bush, MD;  Location: [place] [facility];  Service: Cardiovascular;  Laterality: Left;  LAD and CFX   LAPAROTOMY N/A 09/17/2018   Procedure: EXPLORATORY LAPAROTOMY;  Surgeon: Virl Cagey, MD;  Location: [facility];  Service: General;  Laterality: N/A;   LEFT HEART CATH AND CORONARY ANGIOGRAPHY N/A 06/08/2017   Procedure: LEFT HEART CATH  AND CORONARY ANGIOGRAPHY;  Surgeon: Nelva Bush, MD;  Location: [place] [facility];  Service: Cardiovascular;  Laterality: N/A;   POLYPECTOMY  07/17/2018   Procedure: POLYPECTOMY;  Surgeon: Danie Binder, MD;  [facility];  Service: Endoscopy;;  colon   ROTATOR CUFF REPAIR     Right   SAVORY DILATION N/A 07/17/2018   Procedure: SAVORY DILATION;  Surgeon: Danie Binder, MD;  [facility];  Service: Endoscopy;  Laterality: N/A;    Family History  Problem Relation Age of Onset   Hypertension Mother    Breast cancer Mother 38       brain/bone    Heart attack Father    Diabetes Brother    Skin cancer Sister 81   Brain cancer Maternal Uncle    Cancer Maternal Uncle        NOS   Breast cancer Cousin        maternal cousin dx <50   Cancer Cousin    Colon cancer Neg Hx     Social History   Socioeconomic History   Marital status: Married    Spouse name: Not on file   Number of children: 5   Years of education: Not on file   Highest education level: Not on file  Occupational History   Occupation: employed    Employer: [employer]: full-time  Tobacco Use   Smoking status: Every Day    Packs/day: 0.50    Years: 39.00    Pack years: 19.50    Types: Cigarettes    Start date: 07/24/1970   Smokeless tobacco: Never  Vaping Use   Vaping Use: Never used  Substance and Sexual Activity   Alcohol use: No    Alcohol/week: 0.0 standard drinks   Drug use: No   Sexual activity: Yes    Partners: Female  Other Topics Concern   Not on file  Social History Narrative   Not on file   Social Determinants of Health   Financial Resource Strain: Not on file  Food Insecurity: Not on file  Transportation Needs: Not on file  Physical Activity: Not on file  Stress: Not on file  Social Connections: Not on file  Intimate Partner Violence: Not on file    Outpatient Medications Prior to Visit  Medication Sig Dispense Refill   albuterol (VENTOLIN HFA)  108 (90 Base) MCG/ACT inhaler INHALE 2 PUFFS INTO THE LUNGS EVERY 6 HOURS AS NEEDED FOR WHEEZING OR SHORTNESS OF BREATH 18 g 11   apixaban (ELIQUIS) 5 MG TABS tablet TAKE 1 TABLET(5 MG) BY MOUTH TWICE DAILY 180 tablet 0   ARIPiprazole (ABILIFY) 2 MG tablet Take 1 tablet (2 mg total) by mouth daily. 90 tablet 0   atorvastatin (LIPITOR) 80 MG tablet TAKE 1 TABLET(80 MG) BY MOUTH EVERY EVENING 90 tablet 0   buPROPion (WELLBUTRIN XL) 150 MG 24 hr tablet Take 3 tablets once daily 270 tablet 3   docusate sodium (COLACE) 100  MG capsule Take 100 mg by mouth as needed for mild constipation.     escitalopram (LEXAPRO) 20 MG tablet TAKE 1 TABLET(20 MG) BY MOUTH DAILY 90 tablet 0   ezetimibe (ZETIA) 10 MG tablet TAKE 1 TABLET BY MOUTH EVERY DAY 330 tablet 0   ferrous sulfate 325 (65 FE) MG tablet Take 325 mg by mouth daily with breakfast.     furosemide (LASIX) 20 MG tablet TAKE 2 TABLETS BY MOUTH DAILY 30 tablet 0   lisinopril (ZESTRIL) 2.5 MG tablet TAKE 1 TABLET(2.5 MG) BY MOUTH DAILY. 90 tablet 3   meclizine (ANTIVERT) 25 MG tablet Take 1 tablet (25 mg total) by mouth 3 (three) times daily as needed for dizziness. 30 tablet 0   metFORMIN (GLUCOPHAGE) 500 MG tablet TAKE ONE TABLET (500 MG) BY MOUTH TWICE DAILY WITH A MEAL. 180 tablet 0   metoprolol tartrate (LOPRESSOR) 100 MG tablet Take 1 tablet (100 mg total) by mouth 2 (two) times daily. 180 tablet 0   nitroGLYCERIN (NITROSTAT) 0.4 MG SL tablet Place 1 tablet (0.4 mg total) under the tongue every 5 (five) minutes as needed. 25 tablet 3   omeprazole (PRILOSEC) 40 MG capsule TAKE 1 CAPSULE(40 MG) BY MOUTH DAILY 30 minutes before breakfast 90 capsule 3   ondansetron (ZOFRAN) 4 MG tablet Take 1 tablet (4 mg total) by mouth every 8 (eight) hours as needed for nausea or vomiting. 30 tablet 1   potassium chloride SA (KLOR-CON) 20 MEQ tablet Take 1 tablet (20 mEq total) by mouth daily. 90 tablet 3   rOPINIRole (REQUIP) 0.25 MG tablet TAKE 1 TABLET BY MOUTH EVERY  NIGHT AT BEDTIME (WHEN NEEDED FOR BAD NIGHTS USE '3MG'$  TABLET) 30 tablet 0   rOPINIRole (REQUIP) 3 MG tablet TAKE 1 TABLET BY MOUTH EVERY NIGHT AT BEDTIME 450 tablet 0   sildenafil (VIAGRA) 100 MG tablet TAKE 1 TABLET BY MOUTH 30 MINUTES BEFORE ACTIVITY 2 tablet 0   sucralfate (CARAFATE) 1 g tablet Take one tablet po BID PRN 42 tablet 5   traMADol (ULTRAM) 50 MG tablet TAKE 1 TABLET BY MOUTH TWICE DAILY AS NEEDED FOR MODERATE PAIN. 60 tablet 4   traZODone (DESYREL) 50 MG tablet TAKE 2 TABLETS (100 MG) BY MOUTH AT BEDTIME 180 tablet 1   umeclidinium-vilanterol (ANORO ELLIPTA) 62.5-25 MCG/INH AEPB Anoro Ellipta 62.5 mcg-25 mcg/actuation powder for inhalation  INHALE 1 PUFF INTO THE LUNGS DAILY     traMADol (ULTRAM) 50 MG tablet Take 1 tablet (50 mg total) by mouth twice daily as needed for moderate pain 60 tablet 4   No facility-administered medications prior to visit.    No Known Allergies  ROS Review of Systems  Constitutional: Negative.   HENT: Negative.    Eyes: Negative.   Respiratory: Negative.    Cardiovascular: Negative.   Gastrointestinal: Negative.   Genitourinary: Negative.   Musculoskeletal: Negative.   Skin: Negative.   Neurological: Negative.   Psychiatric/Behavioral: Negative.    All other systems reviewed and are negative.    Objective:    Physical Exam Constitutional:      General: He is not in acute distress.    Appearance: Normal appearance. He is normal weight. He is not ill-appearing, toxic-appearing or diaphoretic.  Cardiovascular:     Rate and Rhythm: Normal rate and regular rhythm.     Heart sounds: Normal heart sounds. No murmur heard.   No friction rub. No gallop.  Pulmonary:     Effort: Pulmonary effort is normal. No respiratory  distress.     Breath sounds: Normal breath sounds. No stridor. No wheezing, rhonchi or rales.  Chest:     Chest wall: No tenderness.  Neurological:     General: No focal deficit present.     Mental Status: He is alert and  oriented to person, place, and time. Mental status is at baseline.     Cranial Nerves: Cranial nerves are intact.     Sensory: Sensation is intact.     Motor: Tremor present. No weakness, atrophy, abnormal muscle tone, seizure activity or pronator drift (while maintaining position for pronator drift, did elicit jaw tremor).     Coordination: Coordination is intact. Romberg sign negative. Coordination normal. Finger-Nose-Finger Test and Heel to [facility] Test normal. Rapid alternating movements normal.     Gait: Gait abnormal.     Deep Tendon Reflexes: Reflexes are normal and symmetric.     Comments: Shuffling gait - notable for OA through multiple joints though  Psychiatric:        Mood and Affect: Mood normal.        Behavior: Behavior normal.        Thought Content: Thought content normal.        Judgment: Judgment normal.    BP 118/60   Pulse 84   Temp 98.3 F (36.8 C) (Temporal)   Ht '5\' 8"'$  (1.727 m)   Wt 213 lb 12.8 oz (97 kg)   SpO2 97%   BMI 32.51 kg/m  Wt Readings from Last 3 Encounters:  04/19/21 213 lb 12.8 oz (97 kg)  04/13/21 211 lb (95.7 kg)  02/10/21 216 lb 12.8 oz (98.3 kg)     Health Maintenance Due  Topic Date Due   OPHTHALMOLOGY EXAM  06/29/2020   INFLUENZA VACCINE  03/29/2021    There are no preventive care reminders to display for this patient.  Lab Results  Component Value Date   TSH 0.40 02/10/2021   Lab Results  Component Value Date   WBC 6.9 02/10/2021   HGB 15.1 02/10/2021   HCT 44.2 02/10/2021   MCV 82.8 02/10/2021   PLT 169.0 02/10/2021   Lab Results  Component Value Date   NA 137 02/10/2021   K 4.2 02/10/2021   CO2 23 02/10/2021   GLUCOSE 133 (H) 02/10/2021   BUN 15 02/10/2021   CREATININE 0.90 02/10/2021   BILITOT 0.7 02/10/2021   ALKPHOS 80 02/10/2021   AST 17 02/10/2021   ALT 25 02/10/2021   PROT 6.6 02/10/2021   ALBUMIN 4.2 02/10/2021   CALCIUM 9.0 02/10/2021   ANIONGAP 9 04/03/2019   GFR 90.23 02/10/2021   Lab Results   Component Value Date   CHOL 156 02/10/2021   Lab Results  Component Value Date   HDL 28.90 (L) 02/10/2021   Lab Results  Component Value Date   LDLCALC 92 02/10/2021   Lab Results  Component Value Date   TRIG 173.0 (H) 02/10/2021   Lab Results  Component Value Date   CHOLHDL 5 02/10/2021   Lab Results  Component Value Date   HGBA1C 6.8 (H) 02/10/2021      Assessment & Plan:   Problem List Items Addressed This Visit       Cardiovascular and Mediastinum   Chronic atrial fibrillation ([place])   Essential hypertension     Endocrine   Non-insulin treated type 2 diabetes mellitus ([place])     Other   Depression   Other Visit Diagnoses     Tremor    -  Primary   Relevant Orders   Ambulatory referral to Neurology       No orders of the defined types were placed in this encounter.   Follow-up: Return in about 6 months (around 10/20/2021) for chronic conditions.   PLAN Concern for PD - will refer to neuro - given symptoms and Paraquat exposure, would want to rule out PD before others.  Continue current meds for depression. Can consider continuing to adjust - likely lower wellbutrin and increase abilify - as tremor gets investigated. Unlikely that the dopaminergic effects of Wellbutrin providing much relief from tremor, but will defer to neurology on that question Otherwise continue regimen as prescribed, continue to follow up with cardiology regularly. Reviewed last set of labs. Results reassuring. Repeat in 6 mo Patient encouraged to call clinic with any questions, comments, or concerns.  Maximiano Coss, NP

## 2021-04-20 ENCOUNTER — Other Ambulatory Visit (HOSPITAL_BASED_OUTPATIENT_CLINIC_OR_DEPARTMENT_OTHER): Payer: Self-pay

## 2021-05-03 NOTE — Progress Notes (Addendum)
Office Visit    Patient Name: Shaun [name] Date of Encounter: 05/04/2021  PCP:  Maximiano Coss, NP   [facility]  Cardiologist:  Kirk Ruths, MD  Advanced Practice Provider:  No care team member to display Electrophysiologist:  None   Chief Complaint    Shaun [name] is a 65 y.o. male with a hx of permanent atrial fibrillation maintained on metoprolol and apixaban, CAD, HTN, HLD, chronic diastolic heart failure, OSA  presents today for cardiac clearance   Past Medical History    Past Medical History:  Diagnosis Date   Anemia    Anxiety    Arteriosclerotic cardiovascular disease (ASCVD)    Nonobstructive; cath in 2000: 30-40% mid LAD and proximal RCA;normal EF. stress nuclear in 2006-subtle inferoseptal hypoperfusion with reversibility; negative stress EKG; good exercise tolerance   Asthma    Cancer ([place])    skin cancer   Cataract    CHF (congestive heart failure) ([place])    Colon polyps    30 colon polyps found on first colonoscopy   COPD (chronic obstructive pulmonary disease) ([place])    Diabetes mellitus without complication ([place])    Diverticulitis    DJD (degenerative joint disease)    GERD (gastroesophageal reflux disease)    History of kidney stones    Hyperlipidemia    Hypertension    Insomnia    Obstructive sleep apnea 12/2009   01/26/2010 AHI 83/hr   Paroxysmal atrial fibrillation ([place]) 10/2004   Onset in 10/2004; recurred 09/2008   PUD (peptic ulcer disease)    1980s   RLS (restless legs syndrome)    Sinusitis    Past Surgical History:  Procedure Laterality Date   BIOPSY  07/17/2018   Procedure: BIOPSY;  Surgeon: Danie Binder, MD;  [facility];  Service: Endoscopy;;  colon   BOWEL RESECTION  09/17/2018   SMALL BOWEL RESECTION: 71 CM    CARDIAC CATHETERIZATION N/A 07/21/2015   Procedure: Left Heart Cath and Coronary Angiography;  Surgeon: Peter M Martinique, MD;  Location: [place] [facility];  Service:  Cardiovascular;  Laterality: N/A;   CARDIAC CATHETERIZATION N/A 07/21/2015   Procedure: Coronary Stent Intervention;  Surgeon: Peter M Martinique, MD;  Location: [place] [facility];  Service: Cardiovascular;  Laterality: N/A;   CIRCUMCISION N/A 04/05/2019   Procedure: CIRCUMCISION ADULT;  Surgeon: Irine Seal, MD;  Location: [facility];  Service: Urology;  Laterality: N/A;   COLONOSCOPY N/A 05/19/2014   Dr. Barnie Alderman diverticulosis/moderate external hemorrhoids, >20 simple adenomas. Genetic screening negative.    COLONOSCOPY WITH PROPOFOL N/A 07/17/2018   Dr. Oneida Alar: Diverticulosis, external/internal hemorrhoids, 32 colon polyps removed.  ten tubular adenomas removed with no high-grade dysplasia.  Advised to have surveillance colonoscopy in 3 years.   ESOPHAGOGASTRODUODENOSCOPY (EGD) WITH PROPOFOL N/A 07/17/2018   Dr. Oneida Alar: Low-grade narrowing Schatzki ring at the GE junction status post dilation.  Gastritis.  Biopsy with mild nonspecific reactive gastropathy.  No H. pylori.   GIVENS CAPSULE STUDY N/A 06/24/2019   normal   HERNIA REPAIR  1986   Left inguinal   INTRAVASCULAR PRESSURE WIRE/FFR STUDY Left 06/08/2017   Procedure: INTRAVASCULAR PRESSURE WIRE/FFR STUDY;  Surgeon: Nelva Bush, MD;  Location: [place] [facility];  Service: Cardiovascular;  Laterality: Left;  LAD and CFX   LAPAROTOMY N/A 09/17/2018   Procedure: EXPLORATORY LAPAROTOMY;  Surgeon: Virl Cagey, MD;  Location: [facility];  Service: General;  Laterality: N/A;   LEFT HEART CATH AND  CORONARY ANGIOGRAPHY N/A 06/08/2017   Procedure: LEFT HEART CATH AND CORONARY ANGIOGRAPHY;  Surgeon: Nelva Bush, MD;  Location: [place] [facility];  Service: Cardiovascular;  Laterality: N/A;   POLYPECTOMY  07/17/2018   Procedure: POLYPECTOMY;  Surgeon: Danie Binder, MD;  [facility];  Service: Endoscopy;;  colon   ROTATOR CUFF REPAIR     Right   SAVORY DILATION N/A 07/17/2018   Procedure: SAVORY DILATION;  Surgeon: Danie Binder, MD;  [facility];  Service: Endoscopy;  Laterality: N/A;    Allergies  No Known Allergies  History of Present Illness    Shaun [name] is a 65 y.o. male with a hx of permanent atrial fibrillation maintained on metoprolol and apixaban, CAD, HTN, HLD, chronic diastolic heart failure, OSA  last seen 06/23/20 by Dr. Stanford Breed.   LHC 05/2017 with mild to moderate nonobstructive disease recommended for medical management. 03/2019 LVEF 60-65%, severe basal septal hypertrophy, RV normal size and function, LA mild-mod dilated, normal aorta.  Last seen 06/23/20 by Dr. Stanford Breed. Zetia '10mg'$  daily was added for lipid control. Given abdominal bruit, abdominal ultrasound was ordered. Cardiac clearance request received for ventral hernia repair with mesh and direction regarding holding Eliquis. Per pharmacy review and office protocols patient can hold Eliquis 2 days prior to planned procedure. He had CT abd ordered by surgical team 03/23/21 with largest measurement or aorta 3.1cm for thoracic aorta and 2.3 cm for abdominal aorta. No aneurysmal dilation seen.   Presents today for follow up. Tells me he is also due for routine colonoscopy with [facility] gastroenterology as well as ventral hernia repair. Tells me he is aware of his atrial fibrillation when he sits really still or when he tries to do more than his usual physical activity. Tells me he [employer] puppy who is 4 months. He has been walking with the dog for exercise. Notes no chest pain, pressure, tightness.  Blood pressure at home 124/64-134/78.  Reports no shortness of breath at rest or dyspnea on exertion.  No edema, orthopnea, PND.  Recently diagnosed with early onset Parkinson's and we discussed that this could in the future cause some autonomic dysfunction and low blood pressure and he will make [place] aware if this happens.  EKGs/Labs/Other Studies Reviewed:   The following studies were reviewed today:  EKG:  EKG  is ordered today.  The ekg ordered today demonstrates rate controlled atrial fibrillation 84 bpm with no acute St/T wave changes.  Recent Labs: 02/10/2021: ALT 25; BUN 15; Creatinine, Ser 0.90; Hemoglobin 15.1; Platelets 169.0; Potassium 4.2; Sodium 137; TSH 0.40  Recent Lipid Panel    Component Value Date/Time   CHOL 156 02/10/2021 0853   CHOL 115 10/29/2020 0800   TRIG 173.0 (H) 02/10/2021 0853   HDL 28.90 (L) 02/10/2021 0853   HDL 29 (L) 10/29/2020 0800   CHOLHDL 5 02/10/2021 0853   VLDL 34.6 02/10/2021 0853   LDLCALC 92 02/10/2021 0853   LDLCALC 58 10/29/2020 0800   LDLCALC 75 12/15/2017 0744    Home Medications   Current Meds  Medication Sig   albuterol (VENTOLIN HFA) 108 (90 Base) MCG/ACT inhaler INHALE 2 PUFFS INTO THE LUNGS EVERY 6 HOURS AS NEEDED FOR WHEEZING OR SHORTNESS OF BREATH   apixaban (ELIQUIS) 5 MG TABS tablet TAKE 1 TABLET(5 MG) BY MOUTH TWICE DAILY   ARIPiprazole (ABILIFY) 2 MG tablet Take 1 tablet (2 mg total) by mouth daily.   atorvastatin (LIPITOR) 80 MG tablet  TAKE 1 TABLET(80 MG) BY MOUTH EVERY EVENING   buPROPion (WELLBUTRIN XL) 150 MG 24 hr tablet Take 3 tablets once daily   docusate sodium (COLACE) 100 MG capsule Take 100 mg by mouth as needed for mild constipation.   escitalopram (LEXAPRO) 20 MG tablet TAKE 1 TABLET(20 MG) BY MOUTH DAILY   ezetimibe (ZETIA) 10 MG tablet TAKE 1 TABLET BY MOUTH EVERY DAY   ferrous sulfate 325 (65 FE) MG tablet Take 325 mg by mouth daily with breakfast.   furosemide (LASIX) 20 MG tablet TAKE 2 TABLETS BY MOUTH DAILY   lisinopril (ZESTRIL) 2.5 MG tablet TAKE 1 TABLET(2.5 MG) BY MOUTH DAILY.   meclizine (ANTIVERT) 25 MG tablet Take 1 tablet (25 mg total) by mouth 3 (three) times daily as needed for dizziness.   metFORMIN (GLUCOPHAGE) 500 MG tablet TAKE ONE TABLET (500 MG) BY MOUTH TWICE DAILY WITH A MEAL.   metoprolol tartrate (LOPRESSOR) 100 MG tablet Take 1 tablet (100 mg total) by mouth 2 (two) times daily.    nitroGLYCERIN (NITROSTAT) 0.4 MG SL tablet Place 1 tablet (0.4 mg total) under the tongue every 5 (five) minutes as needed.   omeprazole (PRILOSEC) 40 MG capsule TAKE 1 CAPSULE(40 MG) BY MOUTH DAILY 30 minutes before breakfast   ondansetron (ZOFRAN) 4 MG tablet Take 1 tablet (4 mg total) by mouth every 8 (eight) hours as needed for nausea or vomiting.   potassium chloride SA (KLOR-CON) 20 MEQ tablet Take 1 tablet (20 mEq total) by mouth daily.   rOPINIRole (REQUIP) 0.25 MG tablet TAKE 1 TABLET BY MOUTH EVERY NIGHT AT BEDTIME (WHEN NEEDED FOR BAD NIGHTS USE '3MG'$  TABLET)   rOPINIRole (REQUIP) 3 MG tablet TAKE 1 TABLET BY MOUTH EVERY NIGHT AT BEDTIME   sildenafil (VIAGRA) 100 MG tablet TAKE 1 TABLET BY MOUTH 30 MINUTES BEFORE ACTIVITY   sucralfate (CARAFATE) 1 g tablet Take one tablet po BID PRN   traMADol (ULTRAM) 50 MG tablet TAKE 1 TABLET BY MOUTH TWICE DAILY AS NEEDED FOR MODERATE PAIN.   traMADol (ULTRAM) 50 MG tablet Take 1 tablet (50 mg total) by mouth twice daily as needed for moderate pain   traZODone (DESYREL) 50 MG tablet TAKE 2 TABLETS (100 MG) BY MOUTH AT BEDTIME   umeclidinium-vilanterol (ANORO ELLIPTA) 62.5-25 MCG/INH AEPB Anoro Ellipta 62.5 mcg-25 mcg/actuation powder for inhalation  INHALE 1 PUFF INTO THE LUNGS DAILY     Review of Systems      All other systems reviewed and are otherwise negative except as noted above.  Physical Exam    VS:  BP 132/76   Pulse 84   Ht '5\' 8"'$  (1.727 m)   Wt 210 lb (95.3 kg)   SpO2 95%   BMI 31.93 kg/m  , BMI Body mass index is 31.93 kg/m.  Wt Readings from Last 3 Encounters:  05/04/21 210 lb (95.3 kg)  04/19/21 213 lb 12.8 oz (97 kg)  04/13/21 211 lb (95.7 kg)    GEN: Well nourished, well developed, in no acute distress. HEENT: normal. Neck: Supple, no JVD, carotid bruits, or masses. Cardiac: Irregularly irregular, no murmurs, rubs, or gallops. No clubbing, cyanosis, edema.  Radials/PT 2+ and equal bilaterally.  Respiratory:   Respirations regular and unlabored, clear to auscultation bilaterally. GI: Soft, nontender, nondistended. MS: No deformity or atrophy. Skin: Warm and dry, no rash. Neuro:  Strength and sensation are intact. Psych: Normal affect.  Assessment & Plan    Preop clearance -upcoming colonoscopy as well as ventral hernia  repair. According to the Revised Cardiac Risk Index (RCRI), his Perioperative Risk of Major Cardiac Event is (%): 6.6. His  Functional Capacity in METs is: 6.36 according to the Duke Activity Status Index (DASI).  He is deemed acceptable risk for the planned procedures without additional cardiovascular testing.  Per pharmacy review he may hold Eliquis 2 days prior to his ventral hernia repair.  Will route message to pharmacy team to ensure that the hold length is is same for colonoscopy.  Addendum 05/05/2021: Per pharmacy team hold Eliquis 2 days prior to colonoscopy.  Resume at discretion of performing provider.  Permanent atrial fibrillation / Chronic anticoagulation -rate controlled today by EKG.  Denies bleeding complications.  Continue Eliquis 5 mg twice daily.  Does not meet dose reduction criteria.  Continue metoprolol tartrate 100 mg twice daily.  HTN - BP well controlled. Continue current antihypertensive regimen.    HLD - Lipid panel 02/10/21 total cholesterol 156, HDL 28.9, LDL 92, triglycerides 173.  Does endorse missing doses of his atorvastatin.  We discussed the importance of taking regularly.  When he was taking that and Zetia regularly LDL was 50 and I anticipate compliance with the daily atorvastatin will get his LDL to goal quickly.  DM2 - Continue to follow with PCP.   Disposition: Follow up in 1 year(s) with Dr. Stanford Breed or APP.  Signed, Loel Dubonnet, NP 05/04/2021, 3:25 PM [facility]

## 2021-05-04 ENCOUNTER — Other Ambulatory Visit: Payer: Self-pay

## 2021-05-04 ENCOUNTER — Other Ambulatory Visit (HOSPITAL_BASED_OUTPATIENT_CLINIC_OR_DEPARTMENT_OTHER): Payer: Self-pay

## 2021-05-04 ENCOUNTER — Encounter (HOSPITAL_BASED_OUTPATIENT_CLINIC_OR_DEPARTMENT_OTHER): Payer: Self-pay | Admitting: Family

## 2021-05-04 ENCOUNTER — Ambulatory Visit (HOSPITAL_BASED_OUTPATIENT_CLINIC_OR_DEPARTMENT_OTHER): Payer: 59 | Admitting: Family

## 2021-05-04 VITALS — BP 132/76 | HR 84 | Ht 68.0 in | Wt 210.0 lb

## 2021-05-04 DIAGNOSIS — E785 Hyperlipidemia, unspecified: Secondary | ICD-10-CM | POA: Diagnosis not present

## 2021-05-04 DIAGNOSIS — I4821 Permanent atrial fibrillation: Secondary | ICD-10-CM | POA: Diagnosis not present

## 2021-05-04 DIAGNOSIS — Z7901 Long term (current) use of anticoagulants: Secondary | ICD-10-CM

## 2021-05-04 DIAGNOSIS — Z01810 Encounter for preprocedural cardiovascular examination: Secondary | ICD-10-CM

## 2021-05-04 DIAGNOSIS — I5032 Chronic diastolic (congestive) heart failure: Secondary | ICD-10-CM | POA: Diagnosis not present

## 2021-05-04 MED ORDER — FUROSEMIDE 40 MG PO TABS
40.0000 mg | ORAL_TABLET | Freq: Every day | ORAL | 3 refills | Status: DC
  Filled 2021-05-04: qty 90, 90d supply, fill #0
  Filled 2021-08-25: qty 90, 90d supply, fill #1
  Filled 2021-11-17: qty 90, 90d supply, fill #2
  Filled 2022-02-11: qty 90, 90d supply, fill #3

## 2021-05-04 MED ORDER — ATORVASTATIN CALCIUM 80 MG PO TABS
80.0000 mg | ORAL_TABLET | Freq: Every day | ORAL | 3 refills | Status: DC
  Filled 2021-05-04: qty 90, 90d supply, fill #0
  Filled 2021-08-25: qty 90, 90d supply, fill #1
  Filled 2021-11-17: qty 90, 90d supply, fill #2
  Filled 2022-02-11: qty 90, 90d supply, fill #3

## 2021-05-04 NOTE — Patient Instructions (Signed)
Medication Instructions:  Your physician has recommended you make the following change in your medication:   CHANGE Furosemide to one '40mg'$  tablet daily  Try to take your Atorvastatin regularly to help reduce your LDL (bad cholesterol) to goal of less than 70. You can take it whatever time of day works best for you.   Eliquis and procedures:  For your ventral hernia repair - HOLD you Eliquis 2 days prior to procedure. Ask the surgeon when to resume. Loel Dubonnet, NP will reach out to our pharmacists about how long to hold Eliquis prior to colonoscopy and will send you a MyChart message with an update. *If you need a refill on your cardiac medications before your next appointment, please call your pharmacy*   Lab Work: None ordered today.   Testing/Procedures: Your EKG today showed rate controlled atrial fibrillation.   Follow-Up: At [facility], you and your health needs are our priority.  As part of our continuing mission to provide you with exceptional heart care, we have created designated Provider Care Teams.  These Care Teams include your primary Cardiologist (physician) and Advanced Practice Providers (APPs -  Physician Assistants and Nurse Practitioners) who all work together to provide you with the care you need, when you need it.  We recommend signing up for the patient portal called "MyChart".  Sign up information is provided on this After Visit Summary.  MyChart is used to connect with patients for Virtual Visits (Telemedicine).  Patients are able to view lab/test results, encounter notes, upcoming appointments, etc.  Non-urgent messages can be sent to your provider as well.   To learn more about what you can do with MyChart, go to [email or web address].    Your next appointment:   1 year(s)  The format for your next appointment:   In Person  Provider:   You may see Kirk Ruths, MD or one of the following Advanced Practice Providers on your designated Care Team:    Sande Rives, PA-C Coletta Memos, FNP Loel Dubonnet, NP    Other Instructions  Heart Healthy Diet Recommendations: A low-salt diet is recommended. Meats should be grilled, baked, or boiled. Avoid fried foods. Focus on lean protein sources like fish or chicken with vegetables and fruits. The American Heart Association is a Microbiologist!   Exercise recommendations: The American Heart Association recommends 150 minutes of moderate intensity exercise weekly. Try 30 minutes of moderate intensity exercise 4-5 times per week. This could include walking, jogging, or swimming.

## 2021-05-05 ENCOUNTER — Other Ambulatory Visit (HOSPITAL_BASED_OUTPATIENT_CLINIC_OR_DEPARTMENT_OTHER): Payer: Self-pay

## 2021-05-05 ENCOUNTER — Encounter (HOSPITAL_BASED_OUTPATIENT_CLINIC_OR_DEPARTMENT_OTHER): Payer: Self-pay

## 2021-05-05 MED ORDER — APIXABAN 5 MG PO TABS
ORAL_TABLET | ORAL | 1 refills | Status: DC
  Filled 2021-05-05 – 2021-08-25 (×2): qty 180, 90d supply, fill #0
  Filled 2021-11-17: qty 180, 90d supply, fill #1

## 2021-05-05 NOTE — Addendum Note (Signed)
Addended by: Mead Slane E on: 05/05/2021 09:11 AM   Modules accepted: Orders

## 2021-05-11 ENCOUNTER — Ambulatory Visit: Payer: 59

## 2021-05-11 ENCOUNTER — Other Ambulatory Visit: Payer: Self-pay | Admitting: Registered Nurse

## 2021-05-11 ENCOUNTER — Other Ambulatory Visit: Payer: Self-pay | Admitting: Cardiology

## 2021-05-11 ENCOUNTER — Other Ambulatory Visit (HOSPITAL_BASED_OUTPATIENT_CLINIC_OR_DEPARTMENT_OTHER): Payer: Self-pay

## 2021-05-11 DIAGNOSIS — F339 Major depressive disorder, recurrent, unspecified: Secondary | ICD-10-CM

## 2021-05-11 MED ORDER — ARIPIPRAZOLE 2 MG PO TABS
2.0000 mg | ORAL_TABLET | Freq: Every day | ORAL | 0 refills | Status: DC
Start: 2021-05-11 — End: 2021-08-25
  Filled 2021-05-11: qty 90, 90d supply, fill #0

## 2021-05-11 MED FILL — Ezetimibe Tab 10 MG: ORAL | 60 days supply | Qty: 60 | Fill #1 | Status: AC

## 2021-05-12 ENCOUNTER — Other Ambulatory Visit (HOSPITAL_BASED_OUTPATIENT_CLINIC_OR_DEPARTMENT_OTHER): Payer: Self-pay

## 2021-05-14 ENCOUNTER — Ambulatory Visit (INDEPENDENT_AMBULATORY_CARE_PROVIDER_SITE_OTHER): Payer: Self-pay | Admitting: *Deleted

## 2021-05-14 VITALS — Ht 68.0 in | Wt 210.0 lb

## 2021-05-14 DIAGNOSIS — Z86010 Personal history of colonic polyps: Secondary | ICD-10-CM

## 2021-05-14 NOTE — Progress Notes (Signed)
Okay to schedule as Shaun [name] has already reviewed this with Dr. Abbey Chatters. ASA III  Will need to reach out to cardiology to get the okay to hold Eliquis x48 hours prior to procedure. Hold iron x7 days prior to procedure. Hold metformin morning of procedure.

## 2021-05-14 NOTE — Progress Notes (Addendum)
Gastroenterology Pre-Procedure Review  Request Date: 05/14/2021 Requesting Physician: 3 year recall, Last TCS 07/17/2018 done by Dr. Oneida Alar, tubular adenomas (x10), hyperplastic polyps, triage per Roseanne Kaufman, NP (see telephone note 12/31/2020)  PATIENT REVIEW QUESTIONS: The patient responded to the following health history questions as indicated:    1. Diabetes Melitis: yes, type II  2. Joint replacements in the past 12 months: no 3. Major health problems in the past 3 months: no 4. Has an artificial valve or MVP: no 5. Has a defibrillator: no 6. Has been advised in past to take antibiotics in advance of a procedure like teeth cleaning: no 7. Family history of colon cancer: no  8. Alcohol Use: no 9. Illicit drug Use: no 10. History of sleep apnea: yes, CPAP  11. History of coronary artery or other vascular stents placed within the last 12 months: no 12. History of any prior anesthesia complications: no 13. Body mass index is 31.93 kg/m.    MEDICATIONS & ALLERGIES:    Patient reports the following regarding taking any blood thinners:   Plavix? no Aspirin? no Coumadin? no Brilinta? no Xarelto? no Eliquis? yes Pradaxa? no Savaysa? no Effient? no  Patient confirms/reports the following medications:  Current Outpatient Medications  Medication Sig Dispense Refill   albuterol (VENTOLIN HFA) 108 (90 Base) MCG/ACT inhaler INHALE 2 PUFFS INTO THE LUNGS EVERY 6 HOURS AS NEEDED FOR WHEEZING OR SHORTNESS OF BREATH (Patient taking differently: as needed. INHALE 2 PUFFS INTO THE LUNGS EVERY 6 HOURS AS NEEDED FOR WHEEZING OR SHORTNESS OF BREATH) 18 g 11   apixaban (ELIQUIS) 5 MG TABS tablet TAKE 1 TABLET(5 MG) BY MOUTH TWICE DAILY 180 tablet 1   ARIPiprazole (ABILIFY) 2 MG tablet Take 1 tablet (2 mg total) by mouth daily. 90 tablet 0   atorvastatin (LIPITOR) 80 MG tablet Take 1 tablet (80 mg total) by mouth daily. TAKE 1 TABLET(80 MG) BY MOUTH EVERY EVENING 90 tablet 3   buPROPion (WELLBUTRIN  XL) 150 MG 24 hr tablet Take 3 tablets once daily 270 tablet 3   docusate sodium (COLACE) 100 MG capsule Take 100 mg by mouth as needed for mild constipation.     escitalopram (LEXAPRO) 20 MG tablet TAKE 1 TABLET(20 MG) BY MOUTH DAILY 90 tablet 0   ezetimibe (ZETIA) 10 MG tablet TAKE 1 TABLET BY MOUTH EVERY DAY 330 tablet 0   ferrous sulfate 325 (65 FE) MG tablet Take 325 mg by mouth daily with breakfast.     furosemide (LASIX) 40 MG tablet Take 1 tablet (40 mg total) by mouth daily. 90 tablet 3   lisinopril (ZESTRIL) 2.5 MG tablet TAKE 1 TABLET(2.5 MG) BY MOUTH DAILY. 90 tablet 3   meclizine (ANTIVERT) 25 MG tablet Take 1 tablet (25 mg total) by mouth 3 (three) times daily as needed for dizziness. (Patient taking differently: Take 25 mg by mouth as needed for dizziness.) 30 tablet 0   metFORMIN (GLUCOPHAGE) 500 MG tablet TAKE ONE TABLET (500 MG) BY MOUTH TWICE DAILY WITH A MEAL. 180 tablet 0   metoprolol tartrate (LOPRESSOR) 100 MG tablet Take 1 tablet (100 mg total) by mouth 2 (two) times daily. 180 tablet 0   nitroGLYCERIN (NITROSTAT) 0.4 MG SL tablet Place 1 tablet (0.4 mg total) under the tongue every 5 (five) minutes as needed. 25 tablet 3   omeprazole (PRILOSEC) 40 MG capsule TAKE 1 CAPSULE(40 MG) BY MOUTH DAILY 30 minutes before breakfast 90 capsule 3   ondansetron (ZOFRAN) 4 MG tablet Take  1 tablet (4 mg total) by mouth every 8 (eight) hours as needed for nausea or vomiting. (Patient taking differently: Take 4 mg by mouth as needed for nausea or vomiting.) 30 tablet 1   potassium chloride SA (KLOR-CON) 20 MEQ tablet Take 1 tablet (20 mEq total) by mouth daily. 90 tablet 3   rOPINIRole (REQUIP) 3 MG tablet TAKE 1 TABLET BY MOUTH EVERY NIGHT AT BEDTIME 450 tablet 0   sildenafil (VIAGRA) 100 MG tablet TAKE 1 TABLET BY MOUTH 30 MINUTES BEFORE ACTIVITY (Patient taking differently: as needed. TAKE 1 TABLET BY MOUTH 30 MINUTES BEFORE ACTIVITY) 2 tablet 0   sucralfate (CARAFATE) 1 g tablet Take one  tablet po BID PRN 42 tablet 5   traMADol (ULTRAM) 50 MG tablet TAKE 1 TABLET BY MOUTH TWICE DAILY AS NEEDED FOR MODERATE PAIN. 60 tablet 4   traZODone (DESYREL) 50 MG tablet TAKE 2 TABLETS (100 MG) BY MOUTH AT BEDTIME 180 tablet 1   umeclidinium-vilanterol (ANORO ELLIPTA) 62.5-25 MCG/INH AEPB as needed.     No current facility-administered medications for this visit.    Patient confirms/reports the following allergies:  No Known Allergies  No orders of the defined types were placed in this encounter.   AUTHORIZATION INFORMATION Primary Insurance: [employer] [place],  [place] #: [id number],  Group #: [id number] Pre-Cert / Josem Kaufmann required: No, not required per Bernardo Heater / Auth #: [id number]  SCHEDULE INFORMATION: Procedure has been scheduled as follows:  Date: 06/07/2021, Time: 9:30 Location: [facility] with Dr. Abbey Chatters  This Gastroenterology Pre-Precedure Review Form is being routed to the following provider(s):  Aliene Altes, PA-C

## 2021-05-17 ENCOUNTER — Other Ambulatory Visit (HOSPITAL_BASED_OUTPATIENT_CLINIC_OR_DEPARTMENT_OTHER): Payer: Self-pay

## 2021-05-17 ENCOUNTER — Encounter: Payer: Self-pay | Admitting: *Deleted

## 2021-05-17 ENCOUNTER — Telehealth: Payer: Self-pay | Admitting: Internal Medicine

## 2021-05-17 ENCOUNTER — Other Ambulatory Visit: Payer: Self-pay | Admitting: *Deleted

## 2021-05-17 MED ORDER — CLENPIQ 10-3.5-12 MG-GM -GM/160ML PO SOLN
1.0000 | Freq: Once | ORAL | 0 refills | Status: AC
  Filled 2021-05-17: qty 320, 2d supply, fill #0

## 2021-05-17 NOTE — Telephone Encounter (Signed)
Pt calling for Durward Fortes, RMA. [phone]

## 2021-05-17 NOTE — Telephone Encounter (Signed)
Reviewed. OK to hold Eliquis x48 hours prior to colonoscopy. We can proceed with scheduling.

## 2021-05-17 NOTE — Patient Instructions (Signed)
[place]   Patient Name:  Shaun [name] Date of procedure:  06/07/2021 Time to register at [place] [facility]:  To be told at Pre-op Provider:  Dr. Abbey Chatters  Please notify us immediately if you are diabetic, take iron supplements, or if you are on coumadin or any blood thinners.  Please hold the following medications: Hold Eliquis x 48 hours prior to procedure.  Hold Iron x 7 days prior to procedure.  Hold Metformin morning of procedure.  (See letter)  Note: Do NOT refrigerate or freeze CLENPIQ. CLENPIQ is ready to drink. There is no need to add any other liquid or mix the medicine in the bottle before you start dosing.   06/06/2021-  1 Day prior to procedure: Sunday  CLEAR LIQUIDS ALL DAY--NO SOLID FOODS OR DAIRY PRODUCTS! See list of liquids that are allowed and items that are NOT allowed below.  Diabetic Medication Instructions:  See letter.  You must drink plenty of CLEAR LIQUIDS starting before your bowel prep. It is important to stay adequately hydrated before, during, and after your bowel prep for the prep to work effectively!  At 4:00 PM Begin the prep as follows:    Drink one bottle of pre-mixed CLENPIQ right from the bottle.  Drink at least five (5) 8-ounce drinks of clear liquids of your choice within the next 5 hours  At 10:00 PM: Drink the second bottle of pre-mixed CLENPIQ right from the bottle.   Drink at least three (3) 8-ounce drinks of clear liquids of your choice within the next 3 hours before going to bed.  Continue clear liquids.    06/07/2021-  Day of Procedure: Monday  Diabetic medications adjustments:  No metformin morning of procedure.  You may take TYLENOL products.  Please continue your regular medications unless we have instructed you otherwise.    At 3 hours before procedure @ 6:30 am: Stop drinking all liquids, nothing by mouth at this point.   Please note, on the day of your procedure you MUST be accompanied by an adult who is  willing to assume responsibility for you at time of discharge. If you do not have such person with you, your procedure will have to be rescheduled.                                                                                                                     Please leave ALL jewelry at home prior to coming to the hospital for your procedure.   *It is your responsibility to check with your insurance company for the benefits of coverage you have for this procedure. Unfortunately, not all insurance companies have benefits to cover all or part of these types of procedures. It is your responsibility to check your benefits, however we will be glad to assist you with any codes your insurance company may need.   Please note that most insurance companies will not cover a screening colonoscopy for people under the age of 63  For example,  with some insurance companies you may have benefits for a screening colonoscopy, but if polyps are found the diagnosis will change and then you may have a deductible that will need to be met. Please make sure you check your benefits for screening colonoscopy as well as a diagnostic colonoscopy.    CLEAR LIQUIDS: (NO RED or PURPLE) Water  Jello   Apple Juice  White Grape Juice   Kool-Aid Soft drinks  Banana popsicles Sports Drink  Black coffee (No cream or milk) Tea (No cream or milk)  Broth (fat free beef/chicken/vegetable)  Clear liquids allow you to see your fingers on the other side of the glass.  Be sure they are NOT RED or PURPLE in color, cloudy, but CLEAR.  Do Not Eat: Dairy products of any kind Cranberry juice Tomato or V8 Juice  Orange Juice   Grapefruit Juice Red Grape Juice Alcohol   Non-dairy creamer Solid foods like cereal, oatmeal, yogurt, fruits, vegetables, creamed soups, eggs, bread, etc   HELPFUL HINTS TO MAKE DRINKING EASIER: -Trying drinking through a straw. -If you become nauseated, try consuming smaller amounts or stretch out the time  between glasses.  Stop for 30 minutes & slowly start back drinking.  Call our office with any questions or concerns at [phone].  Thank You,   , RMA 

## 2021-05-17 NOTE — Telephone Encounter (Signed)
Returned pt's call.  Went over all prep instructions with pt, diabetes medication adjustments, Iron adjustments, and Eliquis adjustments.  Pt made aware of Pre-op appt.  Mailing all information out to pt.

## 2021-05-17 NOTE — Telephone Encounter (Signed)
Noted.  Went over all prep instructions with pt, diabetes medication adjustments, Iron adjustments, and Eliquis adjustments.  Pt made aware of Pre-op appt.  Mailing all information out to pt.

## 2021-05-17 NOTE — Progress Notes (Signed)
Went over all prep instructions with pt, diabetes medication adjustments, Iron adjustments, and Eliquis adjustments.  Pt made aware of Pre-op appt.  Mailing all information out to pt.

## 2021-05-17 NOTE — Addendum Note (Signed)
Addended by: Metro Kung on: 05/17/2021 09:15 AM   Modules accepted: Orders

## 2021-05-18 ENCOUNTER — Other Ambulatory Visit: Payer: Self-pay | Admitting: *Deleted

## 2021-05-18 ENCOUNTER — Other Ambulatory Visit (HOSPITAL_BASED_OUTPATIENT_CLINIC_OR_DEPARTMENT_OTHER): Payer: Self-pay

## 2021-05-20 ENCOUNTER — Other Ambulatory Visit (HOSPITAL_BASED_OUTPATIENT_CLINIC_OR_DEPARTMENT_OTHER): Payer: Self-pay

## 2021-05-20 ENCOUNTER — Other Ambulatory Visit: Payer: Self-pay | Admitting: Physical Medicine & Rehabilitation

## 2021-05-20 MED ORDER — TRAMADOL HCL 50 MG PO TABS
50.0000 mg | ORAL_TABLET | ORAL | 4 refills | Status: DC
  Filled 2021-05-20: qty 60, 30d supply, fill #0
  Filled 2021-06-25: qty 60, 30d supply, fill #1
  Filled 2021-07-29: qty 60, 30d supply, fill #2

## 2021-05-28 ENCOUNTER — Other Ambulatory Visit (HOSPITAL_BASED_OUTPATIENT_CLINIC_OR_DEPARTMENT_OTHER): Payer: Self-pay

## 2021-05-28 DIAGNOSIS — H52223 Regular astigmatism, bilateral: Secondary | ICD-10-CM | POA: Diagnosis not present

## 2021-05-28 LAB — HM DIABETES EYE EXAM

## 2021-06-01 NOTE — Patient Instructions (Signed)
Shaun [name]  06/01/2021     @PREFPERIOPPHARMACY @   Your procedure is scheduled on  06/07/2021.   Report to [facility] at  0730  A.M.    Call this number if you have problems the morning of surgery:  [phone]   Remember:  Follow the diet and prep instructions given to you by the office.    Your last dose of eliquis should be on  06/04/2021.     DO NOT take any medications for diabetes the morning of your procedure.      Use your inhaler before you cpme and bring your rescue inhaler with you.    Take these medicines the morning of surgery with A SIP OF WATER        abilify, wellbutrin, lexapro, antivert(if needed), metoprolol, prilosec, zofran (if needed), tramadol (if needed).     Do not wear jewelry, make-up or nail polish.  Do not wear lotions, powders, or perfumes, or deodorant.  Do not shave 48 hours prior to surgery.  Men may shave face and neck.  Do not bring valuables to the hospital.  [facility] is not responsible for any belongings or valuables.  Contacts, dentures or bridgework may not be worn into surgery.  Leave your suitcase in the car.  After surgery it may be brought to your room.  For patients admitted to the hospital, discharge time will be determined by your treatment team.  Patients discharged the day of surgery will not be allowed to drive home and must have someone with them for 24 hours.    Special instructions:   DO NOT smoke tobacco or vape fore 24 hours before your procedure.  Please read over the following fact sheets that you were given. Anesthesia Post-op Instructions and Care and Recovery After Surgery      Colonoscopy, Adult, Care After This sheet gives you information about how to care for yourself after your procedure. Your health care provider may also give you more specific instructions. If you have problems or questions, contact your health care provider. What can I expect after the procedure? After the  procedure, it is common to have: A small amount of blood in your stool for 24 hours after the procedure. Some gas. Mild cramping or bloating of your abdomen. Follow these instructions at home: Eating and drinking  Drink enough fluid to keep your urine pale yellow. Follow instructions from your health care provider about eating or drinking restrictions. Resume your normal diet as instructed by your health care provider. Avoid heavy or fried foods that are hard to digest. Activity Rest as told by your health care provider. Avoid sitting for a long time without moving. Get up to take short walks every 1-2 hours. This is important to improve blood flow and breathing. Ask for help if you feel weak or unsteady. Return to your normal activities as told by your health care provider. Ask your health care provider what activities are safe for you. Managing cramping and bloating  Try walking around when you have cramps or feel bloated. Apply heat to your abdomen as told by your health care provider. Use the heat source that your health care provider recommends, such as a moist heat pack or a heating pad. Place a towel between your skin and the heat source. Leave the heat on for 20-30 minutes. Remove the heat if your skin turns bright red. This is especially important if you are unable to feel pain,  heat, or cold. You may have a greater risk of getting burned. General instructions If you were given a sedative during the procedure, it can affect you for several hours. Do not drive or operate machinery until your health care provider says that it is safe. For the first 24 hours after the procedure: Do not sign important documents. Do not drink alcohol. Do your regular daily activities at a slower pace than normal. Eat soft foods that are easy to digest. Take over-the-counter and prescription medicines only as told by your health care provider. Keep all follow-up visits as told by your health care  provider. This is important. Contact a health care provider if: You have blood in your stool 2-3 days after the procedure. Get help right away if you have: More than a small spotting of blood in your stool. Large blood clots in your stool. Swelling of your abdomen. Nausea or vomiting. A fever. Increasing pain in your abdomen that is not relieved with medicine. Summary After the procedure, it is common to have a small amount of blood in your stool. You may also have mild cramping and bloating of your abdomen. If you were given a sedative during the procedure, it can affect you for several hours. Do not drive or operate machinery until your health care provider says that it is safe. Get help right away if you have a lot of blood in your stool, nausea or vomiting, a fever, or increased pain in your abdomen. This information is not intended to replace advice given to you by your health care provider. Make sure you discuss any questions you have with your health care provider. Document Revised: 08/09/2019 Document Reviewed: 03/11/2019 Elsevier Patient Education  [place] After This sheet gives you information about how to care for yourself after your procedure. Your health care provider may also give you more specific instructions. If you have problems or questions, contact your health care provider. What can I expect after the procedure? After the procedure, it is common to have: Tiredness. Forgetfulness about what happened after the procedure. Impaired judgment for important decisions. Nausea or vomiting. Some difficulty with balance. Follow these instructions at home: For the time period you were told by your health care provider:   Rest as needed. Do not participate in activities where you could fall or become injured. Do not drive or use machinery. Do not drink alcohol. Do not take sleeping pills or medicines that cause drowsiness. Do not  make important decisions or sign legal documents. Do not take care of children on your own. Eating and drinking Follow the diet that is recommended by your health care provider. Drink enough fluid to keep your urine pale yellow. If you vomit: Drink water, juice, or soup when you can drink without vomiting. Make sure you have little or no nausea before eating solid foods. General instructions Have a responsible adult stay with you for the time you are told. It is important to have someone help care for you until you are awake and alert. Take over-the-counter and prescription medicines only as told by your health care provider. If you have sleep apnea, surgery and certain medicines can increase your risk for breathing problems. Follow instructions from your health care provider about wearing your sleep device: Anytime you are sleeping, including during daytime naps. While taking prescription pain medicines, sleeping medicines, or medicines that make you drowsy. Avoid smoking. Keep all follow-up visits as told by your health care  provider. This is important. Contact a health care provider if: You keep feeling nauseous or you keep vomiting. You feel light-headed. You are still sleepy or having trouble with balance after 24 hours. You develop a rash. You have a fever. You have redness or swelling around the IV site. Get help right away if: You have trouble breathing. You have new-onset confusion at home. Summary For several hours after your procedure, you may feel tired. You may also be forgetful and have poor judgment. Have a responsible adult stay with you for the time you are told. It is important to have someone help care for you until you are awake and alert. Rest as told. Do not drive or operate machinery. Do not drink alcohol or take sleeping pills. Get help right away if you have trouble breathing, or if you suddenly become confused. This information is not intended to replace  advice given to you by your health care provider. Make sure you discuss any questions you have with your health care provider. Document Revised: 04/30/2020 Document Reviewed: 07/18/2019 Elsevier Patient Education  2022 [employer].

## 2021-06-02 ENCOUNTER — Encounter (HOSPITAL_COMMUNITY)
Admission: RE | Admit: 2021-06-02 | Discharge: 2021-06-02 | Disposition: A | Discharge status: Home / Self Care | Payer: 59 | Source: Ambulatory Visit | Attending: Internal Medicine | Admitting: Internal Medicine

## 2021-06-02 DIAGNOSIS — Z01812 Encounter for preprocedural laboratory examination: Secondary | ICD-10-CM | POA: Diagnosis not present

## 2021-06-02 LAB — BASIC METABOLIC PANEL
Anion gap: 7 (ref 5–15)
BUN: 15 mg/dL (ref 8–23)
CO2: 24 mmol/L (ref 22–32)
Calcium: 8.4 mg/dL — ABNORMAL LOW (ref 8.9–10.3)
Chloride: 104 mmol/L (ref 98–111)
Creatinine, Ser: 0.87 mg/dL (ref 0.61–1.24)
GFR, Estimated: >60 mL/min (ref >60)
Glucose, Bld: 98 mg/dL (ref 70–99)
Potassium: 3.8 mmol/L (ref 3.5–5.1)
Sodium: 135 mmol/L (ref 135–145)

## 2021-06-02 LAB — CBC WITH DIFFERENTIAL/PLATELET
Abs Immature Granulocytes: 0.02 10*3/uL (ref 0.00–0.07)
Basophils Absolute: 0 10*3/uL (ref 0.0–0.1)
Basophils Relative: 1 %
Eosinophils Absolute: 0.1 10*3/uL (ref 0.0–0.5)
Eosinophils Relative: 1 %
HCT: 42.6 % (ref 39.0–52.0)
Hemoglobin: 14.4 g/dL (ref 13.0–17.0)
Immature Granulocytes: 0 %
Lymphocytes Relative: 24 %
Lymphs Abs: 1.6 10*3/uL (ref 0.7–4.0)
MCH: 29.3 pg (ref 26.0–34.0)
MCHC: 33.8 g/dL (ref 30.0–36.0)
MCV: 86.6 fL (ref 80.0–100.0)
Monocytes Absolute: 0.5 10*3/uL (ref 0.1–1.0)
Monocytes Relative: 7 %
Neutro Abs: 4.6 10*3/uL (ref 1.7–7.7)
Neutrophils Relative %: 67 %
Platelets: 166 10*3/uL (ref 150–400)
RBC: 4.92 MIL/uL (ref 4.22–5.81)
RDW: 13.4 % (ref 11.5–15.5)
WBC: 6.8 10*3/uL (ref 4.0–10.5)
nRBC: 0 % (ref 0.0–0.2)

## 2021-06-03 ENCOUNTER — Other Ambulatory Visit (HOSPITAL_BASED_OUTPATIENT_CLINIC_OR_DEPARTMENT_OTHER): Payer: Self-pay

## 2021-06-03 ENCOUNTER — Other Ambulatory Visit: Payer: Self-pay | Admitting: Registered Nurse

## 2021-06-03 DIAGNOSIS — E119 Type 2 diabetes mellitus without complications: Secondary | ICD-10-CM

## 2021-06-03 MED ORDER — METFORMIN HCL 500 MG PO TABS
ORAL_TABLET | ORAL | 0 refills | Status: DC
Start: 2021-06-03 — End: 2021-08-25
  Filled 2021-06-03: qty 180, 90d supply, fill #0

## 2021-06-07 ENCOUNTER — Other Ambulatory Visit: Payer: Self-pay

## 2021-06-07 ENCOUNTER — Ambulatory Visit (HOSPITAL_COMMUNITY): Payer: 59 | Admitting: Anesthesiology

## 2021-06-07 ENCOUNTER — Encounter (HOSPITAL_COMMUNITY): Payer: Self-pay

## 2021-06-07 ENCOUNTER — Ambulatory Visit (HOSPITAL_COMMUNITY)
Admission: RE | Admit: 2021-06-07 | Discharge: 2021-06-07 | Disposition: A | Discharge status: Home / Self Care | Payer: 59 | Attending: Internal Medicine | Admitting: Internal Medicine

## 2021-06-07 ENCOUNTER — Encounter (HOSPITAL_COMMUNITY)
Admission: RE | Disposition: A | Discharge status: Home / Self Care | Payer: Self-pay | Source: Home / Self Care | Attending: Internal Medicine

## 2021-06-07 DIAGNOSIS — Z7984 Long term (current) use of oral hypoglycemic drugs: Secondary | ICD-10-CM | POA: Diagnosis not present

## 2021-06-07 DIAGNOSIS — K635 Polyp of colon: Secondary | ICD-10-CM | POA: Diagnosis not present

## 2021-06-07 DIAGNOSIS — D123 Benign neoplasm of transverse colon: Secondary | ICD-10-CM | POA: Diagnosis not present

## 2021-06-07 DIAGNOSIS — Z86010 Personal history of colonic polyps: Secondary | ICD-10-CM | POA: Insufficient documentation

## 2021-06-07 DIAGNOSIS — I509 Heart failure, unspecified: Secondary | ICD-10-CM | POA: Insufficient documentation

## 2021-06-07 DIAGNOSIS — I2510 Atherosclerotic heart disease of native coronary artery without angina pectoris: Secondary | ICD-10-CM | POA: Insufficient documentation

## 2021-06-07 DIAGNOSIS — K648 Other hemorrhoids: Secondary | ICD-10-CM | POA: Insufficient documentation

## 2021-06-07 DIAGNOSIS — Z1211 Encounter for screening for malignant neoplasm of colon: Secondary | ICD-10-CM | POA: Diagnosis not present

## 2021-06-07 DIAGNOSIS — F17210 Nicotine dependence, cigarettes, uncomplicated: Secondary | ICD-10-CM | POA: Diagnosis not present

## 2021-06-07 DIAGNOSIS — J449 Chronic obstructive pulmonary disease, unspecified: Secondary | ICD-10-CM | POA: Insufficient documentation

## 2021-06-07 DIAGNOSIS — D124 Benign neoplasm of descending colon: Secondary | ICD-10-CM | POA: Diagnosis not present

## 2021-06-07 DIAGNOSIS — Z955 Presence of coronary angioplasty implant and graft: Secondary | ICD-10-CM | POA: Insufficient documentation

## 2021-06-07 DIAGNOSIS — E119 Type 2 diabetes mellitus without complications: Secondary | ICD-10-CM | POA: Diagnosis not present

## 2021-06-07 DIAGNOSIS — K5730 Diverticulosis of large intestine without perforation or abscess without bleeding: Secondary | ICD-10-CM | POA: Insufficient documentation

## 2021-06-07 DIAGNOSIS — D1779 Benign lipomatous neoplasm of other sites: Secondary | ICD-10-CM | POA: Diagnosis not present

## 2021-06-07 DIAGNOSIS — K219 Gastro-esophageal reflux disease without esophagitis: Secondary | ICD-10-CM | POA: Insufficient documentation

## 2021-06-07 DIAGNOSIS — I110 Hypertensive heart disease with heart failure: Secondary | ICD-10-CM | POA: Insufficient documentation

## 2021-06-07 HISTORY — PX: POLYPECTOMY: SHX149

## 2021-06-07 HISTORY — PX: COLONOSCOPY WITH PROPOFOL: SHX5780

## 2021-06-07 LAB — GLUCOSE, CAPILLARY: Glucose-Capillary: 142 mg/dL — ABNORMAL HIGH (ref 70–99)

## 2021-06-07 SURGERY — COLONOSCOPY WITH PROPOFOL
Anesthesia: General

## 2021-06-07 MED ORDER — LIDOCAINE HCL (CARDIAC) PF 100 MG/5ML IV SOSY
PREFILLED_SYRINGE | INTRAVENOUS | Status: DC | PRN
  Administered 2021-06-07: 80 mg via INTRAVENOUS

## 2021-06-07 MED ORDER — STERILE WATER FOR IRRIGATION IR SOLN
Status: DC | PRN
  Administered 2021-06-07: 200 mL

## 2021-06-07 MED ORDER — LACTATED RINGERS IV SOLN: INTRAVENOUS | Status: DC

## 2021-06-07 MED ORDER — PROPOFOL 10 MG/ML IV BOLUS
INTRAVENOUS | Status: DC | PRN
Start: 2021-06-07 — End: 2021-06-07
  Administered 2021-06-07: 30 mg via INTRAVENOUS
  Administered 2021-06-07: 20 mg via INTRAVENOUS
  Administered 2021-06-07: 30 mg via INTRAVENOUS
  Administered 2021-06-07: 40 mg via INTRAVENOUS
  Administered 2021-06-07: 100 mg via INTRAVENOUS
  Administered 2021-06-07: 20 mg via INTRAVENOUS

## 2021-06-07 NOTE — Anesthesia Postprocedure Evaluation (Signed)
Anesthesia Post Note  Patient: Shaun [name]  Procedure(s) Performed: COLONOSCOPY WITH PROPOFOL POLYPECTOMY INTESTINAL  Patient location during evaluation: Phase II Anesthesia Type: General Level of consciousness: awake and alert and oriented Pain management: pain level controlled Vital Signs Assessment: post-procedure vital signs reviewed and stable Respiratory status: spontaneous breathing and respiratory function stable Cardiovascular status: blood pressure returned to baseline and stable Postop Assessment: no apparent nausea or vomiting Anesthetic complications: no   No notable events documented.   Last Vitals:  Vitals:   06/07/21 0800 06/07/21 0921  BP: (!) 143/94 (!) 104/58  Resp: 17 (!) 8  Temp: 37.1 C 36.4 C  SpO2: 96% 95%    Last Pain:  Vitals:   06/07/21 0921  TempSrc: Oral  PainSc: 0-No pain                 Sahra Converse C Syd Manges

## 2021-06-07 NOTE — H&P (Signed)
Primary Care Physician:  Maximiano Coss, NP Primary Gastroenterologist:  Dr. Abbey Chatters  Pre-Procedure History & Physical: HPI:  Shaun [name] is a 65 y.o. male is here for a colonoscopy for surveillance purposes. 3 year recall, Last TCS 07/17/2018 done by Dr. Oneida Alar, tubular adenomas (x10), hyperplastic polyps,  Past Medical History:  Diagnosis Date   Anemia    Anxiety    Arteriosclerotic cardiovascular disease (ASCVD)    Nonobstructive; cath in 2000: 30-40% mid LAD and proximal RCA;normal EF. stress nuclear in 2006-subtle inferoseptal hypoperfusion with reversibility; negative stress EKG; good exercise tolerance   Asthma    Cancer ([place])    skin cancer   Cataract    CHF (congestive heart failure) (HCC)    Colon polyps    30 colon polyps found on first colonoscopy   COPD (chronic obstructive pulmonary disease) ([place])    Diabetes mellitus without complication ([place])    Diverticulitis    DJD (degenerative joint disease)    GERD (gastroesophageal reflux disease)    History of kidney stones    Hyperlipidemia    Hypertension    Insomnia    Obstructive sleep apnea 12/2009   01/26/2010 AHI 83/hr   Paroxysmal atrial fibrillation ([place]) 10/2004   Onset in 10/2004; recurred 09/2008   PUD (peptic ulcer disease)    1980s   RLS (restless legs syndrome)    Sinusitis     Past Surgical History:  Procedure Laterality Date   BIOPSY  07/17/2018   Procedure: BIOPSY;  Surgeon: Danie Binder, MD;  [facility];  Service: Endoscopy;;  colon   BOWEL RESECTION  09/17/2018   SMALL BOWEL RESECTION: 71 CM    CARDIAC CATHETERIZATION N/A 07/21/2015   Procedure: Left Heart Cath and Coronary Angiography;  Surgeon: Peter M Martinique, MD;  Location: [place] [facility];  Service: Cardiovascular;  Laterality: N/A;   CARDIAC CATHETERIZATION N/A 07/21/2015   Procedure: Coronary Stent Intervention;  Surgeon: Peter M Martinique, MD;  Location: [place] [facility];  Service: Cardiovascular;  Laterality: N/A;    CIRCUMCISION N/A 04/05/2019   Procedure: CIRCUMCISION ADULT;  Surgeon: Irine Seal, MD;  Location: [facility];  Service: Urology;  Laterality: N/A;   COLONOSCOPY N/A 05/19/2014   Dr. Barnie Alderman diverticulosis/moderate external hemorrhoids, >20 simple adenomas. Genetic screening negative.    COLONOSCOPY WITH PROPOFOL N/A 07/17/2018   Dr. Oneida Alar: Diverticulosis, external/internal hemorrhoids, 32 colon polyps removed.  ten tubular adenomas removed with no high-grade dysplasia.  Advised to have surveillance colonoscopy in 3 years.   ESOPHAGOGASTRODUODENOSCOPY (EGD) WITH PROPOFOL N/A 07/17/2018   Dr. Oneida Alar: Low-grade narrowing Schatzki ring at the GE junction status post dilation.  Gastritis.  Biopsy with mild nonspecific reactive gastropathy.  No H. pylori.   GIVENS CAPSULE STUDY N/A 06/24/2019   normal   HERNIA REPAIR  1986   Left inguinal   INTRAVASCULAR PRESSURE WIRE/FFR STUDY Left 06/08/2017   Procedure: INTRAVASCULAR PRESSURE WIRE/FFR STUDY;  Surgeon: Nelva Bush, MD;  Location: [place] [facility];  Service: Cardiovascular;  Laterality: Left;  LAD and CFX   LAPAROTOMY N/A 09/17/2018   Procedure: EXPLORATORY LAPAROTOMY;  Surgeon: Virl Cagey, MD;  Location: [facility];  Service: General;  Laterality: N/A;   LEFT HEART CATH AND CORONARY ANGIOGRAPHY N/A 06/08/2017   Procedure: LEFT HEART CATH AND CORONARY ANGIOGRAPHY;  Surgeon: Nelva Bush, MD;  Location: [place] [facility];  Service: Cardiovascular;  Laterality: N/A;   POLYPECTOMY  07/17/2018   Procedure: POLYPECTOMY;  Surgeon: Danie Binder, MD;  [facility];  Service: Endoscopy;;  colon   ROTATOR CUFF REPAIR     Right   SAVORY DILATION N/A 07/17/2018   Procedure: SAVORY DILATION;  Surgeon: Danie Binder, MD;  [facility];  Service: Endoscopy;  Laterality: N/A;    Prior to Admission medications   Medication Sig Start Date End Date Taking? Authorizing Provider  apixaban (ELIQUIS) 5 MG TABS tablet TAKE 1  TABLET(5 MG) BY MOUTH TWICE DAILY 05/05/21  Yes Crenshaw, Denice Bors, MD  ARIPiprazole (ABILIFY) 2 MG tablet Take 1 tablet (2 mg total) by mouth daily. 05/11/21  Yes Maximiano Coss, NP  atorvastatin (LIPITOR) 80 MG tablet Take 1 tablet (80 mg total) by mouth daily. TAKE 1 TABLET(80 MG) BY MOUTH EVERY EVENING 05/04/21  Yes Loel Dubonnet, NP  buPROPion (WELLBUTRIN XL) 150 MG 24 hr tablet Take 3 tablets once daily Patient taking differently: Take 150 mg by mouth 3 (three) times daily with meals. 02/10/21  Yes Maximiano Coss, NP  docusate sodium (COLACE) 100 MG capsule Take 100 mg by mouth daily as needed for mild constipation.   Yes [provider]  escitalopram (LEXAPRO) 20 MG tablet TAKE 1 TABLET(20 MG) BY MOUTH DAILY 03/13/20  Yes Lovena Le, Malena M, DO  ezetimibe (ZETIA) 10 MG tablet TAKE 1 TABLET BY MOUTH EVERY DAY 06/23/20 07/11/21 Yes Lelon Perla, MD  ferrous sulfate 325 (65 FE) MG tablet Take 325 mg by mouth daily with breakfast.   Yes [provider]  furosemide (LASIX) 40 MG tablet Take 1 tablet (40 mg total) by mouth daily. 05/04/21 04/29/22 Yes Loel Dubonnet, NP  lisinopril (ZESTRIL) 2.5 MG tablet TAKE 1 TABLET(2.5 MG) BY MOUTH DAILY. 02/04/21 02/04/22 Yes Crenshaw, Denice Bors, MD  metFORMIN (GLUCOPHAGE) 500 MG tablet TAKE ONE TABLET (500 MG) BY MOUTH TWICE DAILY WITH A MEAL. 06/03/21 06/03/22 Yes Maximiano Coss, NP  metoprolol tartrate (LOPRESSOR) 100 MG tablet Take 1 tablet (100 mg total) by mouth 2 (two) times daily. 04/08/21  Yes Maximiano Coss, NP  omeprazole (PRILOSEC) 40 MG capsule TAKE 1 CAPSULE(40 MG) BY MOUTH DAILY 30 minutes before breakfast 12/31/20  Yes Annitta Needs, NP  potassium chloride SA (KLOR-CON) 20 MEQ tablet Take 1 tablet (20 mEq total) by mouth daily. 04/01/21  Yes Lelon Perla, MD  rOPINIRole (REQUIP) 3 MG tablet TAKE 1 TABLET BY MOUTH EVERY NIGHT AT BEDTIME 03/17/21 03/17/22 Yes Maximiano Coss, NP  sildenafil (VIAGRA) 100 MG tablet TAKE 1 TABLET BY MOUTH  30 MINUTES BEFORE ACTIVITY Patient taking differently: Take 100 mg by mouth daily as needed for erectile dysfunction. 08/25/20  Yes Lelon Perla, MD  traMADol (ULTRAM) 50 MG tablet Take 1 tablet (50 mg total) by mouth twice daily as needed for moderate pain 05/20/21  Yes Kirsteins, Luanna Salk, MD  traZODone (DESYREL) 50 MG tablet TAKE 2 TABLETS (100 MG) BY MOUTH AT BEDTIME 02/09/21 02/09/22 Yes Maximiano Coss, NP  albuterol (VENTOLIN HFA) 108 (90 Base) MCG/ACT inhaler INHALE 2 PUFFS INTO THE LUNGS EVERY 6 HOURS AS NEEDED FOR WHEEZING OR SHORTNESS OF BREATH 07/19/19   Icard, Octavio Graves, DO  meclizine (ANTIVERT) 25 MG tablet Take 1 tablet (25 mg total) by mouth 3 (three) times daily as needed for dizziness. 10/18/16   Daleen Bo, MD  nitroGLYCERIN (NITROSTAT) 0.4 MG SL tablet Place 1 tablet (0.4 mg total) under the tongue every 5 (five) minutes as needed. 06/30/15   Lendon Colonel, NP  ondansetron (ZOFRAN) 4 MG  tablet Take 1 tablet (4 mg total) by mouth every 8 (eight) hours as needed for nausea or vomiting. 12/31/19   Annitta Needs, NP  pseudoephedrine (SUDAFED) 30 MG tablet Take 30 mg by mouth every 6 (six) hours as needed for congestion.    [provider]  sucralfate (CARAFATE) 1 g tablet Take one tablet po BID PRN Patient taking differently: Take 1 g by mouth 2 (two) times daily as needed (acid reflux). 01/08/20   Mikey Kirschner, MD    Allergies as of 05/17/2021   (No Known Allergies)    Family History  Problem Relation Age of Onset   Hypertension Mother    Breast cancer Mother 80       brain/bone    Heart attack Father    Diabetes Brother    Skin cancer Sister 69   Brain cancer Maternal Uncle    Cancer Maternal Uncle        NOS   Breast cancer Cousin        maternal cousin dx <50   Cancer Cousin    Colon cancer Neg Hx     Social History   Socioeconomic History   Marital status: Married    Spouse name: Not on file   Number of children: 5   Years of  education: Not on file   Highest education level: Not on file  Occupational History   Occupation: employed    Employer: [employer]: full-time  Tobacco Use   Smoking status: Every Day    Packs/day: 0.50    Years: 39.00    Pack years: 19.50    Types: Cigarettes    Start date: 07/24/1970   Smokeless tobacco: Never  Vaping Use   Vaping Use: Never used  Substance and Sexual Activity   Alcohol use: No    Alcohol/week: 0.0 standard drinks   Drug use: No   Sexual activity: Yes    Partners: Female  Other Topics Concern   Not on file  Social History Narrative   Not on file   Social Determinants of Health   Financial Resource Strain: Not on file  Food Insecurity: Not on file  Transportation Needs: Not on file  Physical Activity: Not on file  Stress: Not on file  Social Connections: Not on file  Intimate Partner Violence: Not on file    Review of Systems: See HPI, otherwise negative ROS  Physical Exam: Vital signs in last 24 hours: Temp:  [98.8 F (37.1 C)] 98.8 F (37.1 C) (10/10 0800) Resp:  [17] 17 (10/10 0800) BP: (143)/(94) 143/94 (10/10 0800) SpO2:  [96 %] 96 % (10/10 0800) Weight:  [92.5 kg] 92.5 kg (10/10 0757)   General:   Alert,  Well-developed, well-nourished, pleasant and cooperative in NAD Head:  Normocephalic and atraumatic. Eyes:  Sclera clear, no icterus.   Conjunctiva pink. Ears:  Normal auditory acuity. Nose:  No deformity, discharge,  or lesions. Mouth:  No deformity or lesions, dentition normal. Neck:  Supple; no masses or thyromegaly. Lungs:  Clear throughout to auscultation.   No wheezes, crackles, or rhonchi. No acute distress. Heart:  Regular rate and rhythm; no murmurs, clicks, rubs,  or gallops. Abdomen:  Soft, nontender and nondistended. No masses, hepatosplenomegaly or hernias noted. Normal bowel sounds, without guarding, and without rebound.   Msk:  Symmetrical without gross deformities. Normal posture. Extremities:  Without  clubbing or edema. Neurologic:  Alert and  oriented x4;  grossly normal neurologically. Skin:  Intact without significant lesions or rashes. Cervical Nodes:  No significant cervical adenopathy. Psych:  Alert and cooperative. Normal mood and affect.  Impression/Plan: Shaun [name] is here for a colonoscopy for surveillance purposes. 3 year recall, Last TCS 07/17/2018 done by Dr. Oneida Alar, tubular adenomas (x10), hyperplastic polyps,  The risks of the procedure including infection, bleed, or perforation as well as benefits, limitations, alternatives and imponderables have been reviewed with the patient. Questions have been answered. All parties agreeable.

## 2021-06-07 NOTE — Op Note (Signed)
[facility] Patient Name: Shaun [name] Procedure Date: 06/07/2021 8:55 AM MRN: [id number] Date of Birth: June 04, 1956 Attending MD: Elon Alas. Abbey Chatters DO CSN: [id number] Age: 65 Admit Type: Outpatient Procedure:                Colonoscopy Indications:              Surveillance: Personal history of adenomatous                            polyps on last colonoscopy 3 years ago Providers:                Elon Alas. Abbey Chatters, DO, Janeece Riggers, RN, Raphael Gibney, Technician Referring MD:              Medicines:                See the Anesthesia note for documentation of the                            administered medications Complications:            No immediate complications. Estimated Blood Loss:     Estimated blood loss was minimal. Procedure:                Pre-Anesthesia Assessment:                           - The anesthesia plan was to use monitored                            anesthesia care (MAC).                           After obtaining informed consent, the colonoscope                            was passed under direct vision. Throughout the                            procedure, the patient's blood pressure, pulse, and                            oxygen saturations were monitored continuously. The                            [id number]-HQ190L ([id number]) was introduced through the                            anus and advanced to the the cecum, identified by                            appendiceal orifice and ileocecal valve. The                            colonoscopy was performed without difficulty. The  patient tolerated the procedure well. The quality                            of the bowel preparation was evaluated using the                            BBPS [facility] Bowel Preparation Scale) with scores                            of: Right Colon = 2 (minor amount of residual                            staining, small fragments of  stool and/or opaque                            liquid, but mucosa seen well), Transverse Colon = 3                            (entire mucosa seen well with no residual staining,                            small fragments of stool or opaque liquid) and Left                            Colon = 3 (entire mucosa seen well with no residual                            staining, small fragments of stool or opaque                            liquid). The total BBPS score equals 8. The quality                            of the bowel preparation was good. Scope In: 9:04:15 AM Scope Out: 9:18:57 AM Scope Withdrawal Time: 0 hours 12 minutes 7 seconds  Total Procedure Duration: 0 hours 14 minutes 42 seconds  Findings:      The perianal and digital rectal examinations were normal.      Non-bleeding internal hemorrhoids were found during endoscopy.      Multiple small and large-mouthed diverticula were found in the sigmoid       colon and descending colon.      There was a medium-sized lipoma, in the ascending colon.      Two sessile polyps were found in the transverse colon. The polyps were 2       mm in size. These polyps were removed with a cold biopsy forceps.       Resection and retrieval were complete.      Two sessile polyps were found in the descending colon and transverse       colon. The polyps were 4 to 6 mm in size. These polyps were removed with       a cold snare. Resection and retrieval were complete. Impression:               -  Non-bleeding internal hemorrhoids.                           - Diverticulosis in the sigmoid colon and in the                            descending colon.                           - Medium-sized lipoma in the ascending colon.                           - Two 2 mm polyps in the transverse colon, removed                            with a cold biopsy forceps. Resected and retrieved.                           - Two 4 to 6 mm polyps in the descending colon and                             in the transverse colon, removed with a cold snare.                            Resected and retrieved. Moderate Sedation:      Per Anesthesia Care Recommendation:           - Patient has a contact number available for                            emergencies. The signs and symptoms of potential                            delayed complications were discussed with the                            patient. Return to normal activities tomorrow.                            Written discharge instructions were provided to the                            patient.                           - Resume previous diet.                           - Continue present medications.                           - Await pathology results.                           - Repeat colonoscopy in 5 years for surveillance.                           -  Return to GI clinic PRN. Procedure Code(s):        --- Professional ---                           [phone], Colonoscopy, flexible; with removal of                            tumor(s), polyp(s), or other lesion(s) by snare                            technique                           [id number], 46, Colonoscopy, flexible; with biopsy,                            single or multiple Diagnosis Code(s):        --- Professional ---                           Z86.010, Personal history of colonic polyps                           K64.8, Other hemorrhoids                           D17.5, Benign lipomatous neoplasm of                            intra-abdominal organs                           K63.5, Polyp of colon                           K57.30, Diverticulosis of large intestine without                            perforation or abscess without bleeding CPT copyright 2019 [employer] Association. All rights reserved. The codes documented in this report are preliminary and upon coder review may  be revised to meet current compliance requirements. Elon Alas. Abbey Chatters, DO [place], DO 06/07/2021 9:22:03 AM This report has been signed electronically. Number of Addenda: 0

## 2021-06-07 NOTE — Anesthesia Procedure Notes (Signed)
Date/Time: 06/07/2021 9:03 AM Performed by: Tacy Learn, CRNA Pre-anesthesia Checklist: Patient identified, Emergency Drugs available, Suction available and Patient being monitored Patient Re-evaluated:Patient Re-evaluated prior to induction Oxygen Delivery Method: Nasal cannula Induction Type: IV induction Placement Confirmation: positive ETCO2

## 2021-06-07 NOTE — Discharge Instructions (Addendum)
  Colonoscopy Discharge Instructions  Read the instructions outlined below and refer to this sheet in the next few weeks. These discharge instructions provide you with general information on caring for yourself after you leave the hospital. Your doctor may also give you specific instructions. While your treatment has been planned according to the most current medical practices available, unavoidable complications occasionally occur.   ACTIVITY You may resume your regular activity, but move at a slower pace for the next 24 hours.  Take frequent rest periods for the next 24 hours.  Walking will help get rid of the air and reduce the bloated feeling in your belly (abdomen).  No driving for 24 hours (because of the medicine (anesthesia) used during the test).   Do not sign any important legal documents or operate any machinery for 24 hours (because of the anesthesia used during the test).  NUTRITION Drink plenty of fluids.  You may resume your normal diet as instructed by your doctor.  Begin with a light meal and progress to your normal diet. Heavy or fried foods are harder to digest and may make you feel sick to your stomach (nauseated).  Avoid alcoholic beverages for 24 hours or as instructed.  MEDICATIONS You may resume your normal medications unless your doctor tells you otherwise.  WHAT YOU CAN EXPECT TODAY Some feelings of bloating in the abdomen.  Passage of more gas than usual.  Spotting of blood in your stool or on the toilet paper.  IF YOU HAD POLYPS REMOVED DURING THE COLONOSCOPY: No aspirin products for 7 days or as instructed.  No alcohol for 7 days or as instructed.  Eat a soft diet for the next 24 hours.  FINDING OUT THE RESULTS OF YOUR TEST Not all test results are available during your visit. If your test results are not back during the visit, make an appointment with your caregiver to find out the results. Do not assume everything is normal if you have not heard from your  caregiver or the medical facility. It is important for you to follow up on all of your test results.  SEEK IMMEDIATE MEDICAL ATTENTION IF: You have more than a spotting of blood in your stool.  Your belly is swollen (abdominal distention).  You are nauseated or vomiting.  You have a temperature over 101.  You have abdominal pain or discomfort that is severe or gets worse throughout the day.   Your colonoscopy revealed 4 polyp(s) which I removed successfully. Await pathology results, my office will contact you. I recommend repeating colonoscopy in 5 years for surveillance purposes.   You also have diverticulosis and internal hemorrhoids. I would recommend increasing fiber in your diet or adding OTC Benefiber/Metamucil. Be sure to drink at least 4 to 6 glasses of water daily. Follow-up with GI as needed.   I hope you have a great rest of your week!  Charles K. Carver, D.O. Gastroenterology and Hepatology [facility]  

## 2021-06-07 NOTE — Anesthesia Preprocedure Evaluation (Signed)
Anesthesia Evaluation  Patient identified by MRN, date of birth, ID band Patient awake    Reviewed: Allergy & Precautions, NPO status , Patient's Chart, lab work & pertinent test results, reviewed documented beta blocker date and time   Airway Mallampati: III  TM Distance: >3 FB Neck ROM: Full    Dental  (+) Dental Advisory Given, Missing, Chipped   Pulmonary neg shortness of breath, asthma , sleep apnea and Continuous Positive Airway Pressure Ventilation , COPD,  COPD inhaler, Current Smoker and Patient abstained from smoking.,    Pulmonary exam normal breath sounds clear to auscultation       Cardiovascular Exercise Tolerance: Good hypertension, Pt. on home beta blockers and Pt. on medications + angina + CAD, + Cardiac Stents and +CHF  + dysrhythmias Atrial Fibrillation  Rhythm:Irregular Rate:Normal     Neuro/Psych PSYCHIATRIC DISORDERS Anxiety Depression  Neuromuscular disease (RLS)    GI/Hepatic Neg liver ROS, PUD, GERD  Medicated and Controlled,  Endo/Other  diabetes, Well Controlled, Type 2, Oral Hypoglycemic Agents  Renal/GU      Musculoskeletal  (+) Arthritis ,   Abdominal   Peds  Hematology  (+) anemia ,   Anesthesia Other Findings 1. The left ventricle has normal systolic function with an ejection  fraction of 60-65%. The cavity size was normal. Severe basal septal  hypertrophy. Left ventricular diastolic function could not be evaluated  secondary to atrial fibrillation. Elevated  left ventricular end-diastolic pressure.  2. The right ventricle has normal systolic function. The cavity was  normal. There is no increase in right ventricular wall thickness. Right  ventricular systolic pressure could not be assessed.  3. Left atrial size was mild-moderately dilated.  4. The aorta is normal in size and structure  Reproductive/Obstetrics                            Anesthesia  Physical Anesthesia Plan  ASA: 3  Anesthesia Plan: General   Post-op Pain Management:    Induction: Intravenous  PONV Risk Score and Plan: TIVA  Airway Management Planned: Nasal Cannula and Natural Airway  Additional Equipment:   Intra-op Plan:   Post-operative Plan:   Informed Consent: I have reviewed the patients History and Physical, chart, labs and discussed the procedure including the risks, benefits and alternatives for the proposed anesthesia with the patient or authorized representative who has indicated his/her understanding and acceptance.     Dental advisory given  Plan Discussed with: CRNA and Surgeon  Anesthesia Plan Comments:         Anesthesia Quick Evaluation

## 2021-06-07 NOTE — Transfer of Care (Signed)
Immediate Anesthesia Transfer of Care Note  Patient: Shaun [name]  Procedure(s) Performed: COLONOSCOPY WITH PROPOFOL POLYPECTOMY INTESTINAL  Patient Location: Short Stay  Anesthesia Type:General  Level of Consciousness: awake, alert , oriented and patient cooperative  Airway & Oxygen Therapy: Patient Spontanous Breathing  Post-op Assessment: Report given to RN, Post -op Vital signs reviewed and stable and Patient moving all extremities X 4  Post vital signs: Reviewed and stable  Last Vitals:  Vitals Value Taken Time  BP    Temp    Pulse    Resp    SpO2      Last Pain:  Vitals:   06/07/21 0901  TempSrc:   PainSc: 0-No pain      Patients Stated Pain Goal: 7 (16/10/96 0454)  Complications: No notable events documented.

## 2021-06-08 LAB — SURGICAL PATHOLOGY

## 2021-06-10 ENCOUNTER — Encounter (HOSPITAL_COMMUNITY): Payer: Self-pay | Admitting: Internal Medicine

## 2021-06-14 ENCOUNTER — Encounter: Payer: Self-pay | Admitting: *Deleted

## 2021-06-14 ENCOUNTER — Other Ambulatory Visit (HOSPITAL_BASED_OUTPATIENT_CLINIC_OR_DEPARTMENT_OTHER): Payer: Self-pay

## 2021-06-15 ENCOUNTER — Encounter: Payer: 59 | Attending: Physical Medicine & Rehabilitation | Admitting: Physical Medicine & Rehabilitation

## 2021-06-15 ENCOUNTER — Encounter: Payer: Self-pay | Admitting: Physical Medicine & Rehabilitation

## 2021-06-15 ENCOUNTER — Other Ambulatory Visit: Payer: Self-pay

## 2021-06-15 VITALS — BP 129/70 | Temp 98.0°F | Ht 68.0 in | Wt 210.0 lb

## 2021-06-15 DIAGNOSIS — M1611 Unilateral primary osteoarthritis, right hip: Secondary | ICD-10-CM | POA: Diagnosis not present

## 2021-06-15 NOTE — Patient Instructions (Signed)
Hip injection on RIght Celestone 6mg  and Lidocaine 1% x 39ml injected

## 2021-06-15 NOTE — Progress Notes (Signed)
RIght hip intra-articular injection under fluoro guidance  Indication osteoarthritis unresponsive to conservative care including exercise and oral medications  Informed consent was obtained after describing risks and benefits of the procedure, this includes bleeding bruising and infection. The patient elected to proceed and has given written consent  Placed supine on fluoroscopy table.  Betadine prep to groin area.  Imaging to identify the intertrochanteric line at the base of the neck of the femur. 5 cc of 1% lidocaine were infiltrated into the skin and subcu tissue using 25-gauge 1.5 inch needle.  Then a 22-gauge 5" needle was inserted under fluoroscopic guidance targeting the junction of the  Right  femoral head and femoral neck area.  Bone contact was made.  Isovue 200 times a total of 3 cc were injected needle was adjusted to achieve intra-articular location. Then a solution containing 1 cc of 6 mg/cc Celestone and 4 cc of 1% lidocaine were injected.  Patient tolerated procedure well post procedure instructions given

## 2021-06-15 NOTE — Progress Notes (Signed)
  PROCEDURE RECORD [facility]   Name: Shaun [name] DOB:01-13-1956 MRN: [id number]  Date:06/15/2021  Physician: Alysia Penna, MD    Nurse/CMA: Truman Hayward, CMA  Allergies: No Known Allergies  Consent Signed: Yes.    Is patient diabetic? Yes.    CBG today? 147  Pregnant: No. LMP: No LMP for male patient. (age 65-55)  Anticoagulants: yes (Eliquis, no hold) Anti-inflammatory: no Antibiotics: no  Procedure: Right Hip Joint Injection  Position: Supine Start Time: 3:14 pm  End Time: 3:23 pm  Fluoro Time: 25  RN/CMA Truman Hayward, CMA [name], CMA    Time 2:53 pm 3:32 pm    BP 129/70  146/90       Pulse 77 79    Respirations 16 16    O2 Sat 96 94    S/S 6 6    Pain Level 4/10 2/10     D/C home with self, patient A & O X 3, D/C instructions reviewed, and sits independently.

## 2021-06-24 NOTE — Progress Notes (Signed)
HPI: FU permanent atrial fibrillation, coronary artery disease, diabetes mellitus and hypertension. Patient is status post PCI of his circumflex in November 2016. Patient was seen in atrial fibrillation clinic in June 2018 to see if there were options for restoring sinus rhythm. Given long-standing atrial fibrillation and left atrial enlargement it was felt that rate control and anticoagulation indicated.  Cardiac catheterization October 2018 showed a 30% distal circumflex.  LV function was normal with normal LV filling pressure. Monitor 8/20 showed atrial fibrillation with PVCs or aberrantly conducted beats, rate controlled.  Echo 8/10 showed normal LV function, mild to moderate LAE. Also with chronic diastolic CHF.  Abdominal CT July 2022 showed no aneurysm.  Since last seen, there is dyspnea with more vigorous activities but not routine activities.  No orthopnea, PND, pedal edema, chest pain, palpitations, syncope or bleeding.  Current Outpatient Medications  Medication Sig Dispense Refill   albuterol (VENTOLIN HFA) 108 (90 Base) MCG/ACT inhaler INHALE 2 PUFFS INTO THE LUNGS EVERY 6 HOURS AS NEEDED FOR WHEEZING OR SHORTNESS OF BREATH 18 g 11   apixaban (ELIQUIS) 5 MG TABS tablet TAKE 1 TABLET(5 MG) BY MOUTH TWICE DAILY 180 tablet 1   ARIPiprazole (ABILIFY) 2 MG tablet Take 1 tablet (2 mg total) by mouth daily. 90 tablet 0   atorvastatin (LIPITOR) 80 MG tablet Take 1 tablet (80 mg total) by mouth daily. TAKE 1 TABLET(80 MG) BY MOUTH EVERY EVENING 90 tablet 3   buPROPion (WELLBUTRIN XL) 150 MG 24 hr tablet Take 3 tablets once daily (Patient taking differently: Take 150 mg by mouth 3 (three) times daily with meals.) 270 tablet 3   docusate sodium (COLACE) 100 MG capsule Take 100 mg by mouth daily as needed for mild constipation.     escitalopram (LEXAPRO) 20 MG tablet TAKE 1 TABLET(20 MG) BY MOUTH DAILY 90 tablet 0   ezetimibe (ZETIA) 10 MG tablet TAKE 1 TABLET BY MOUTH EVERY DAY 330 tablet 0    ferrous sulfate 325 (65 FE) MG tablet Take 325 mg by mouth daily with breakfast.     furosemide (LASIX) 40 MG tablet Take 1 tablet (40 mg total) by mouth daily. 90 tablet 3   lisinopril (ZESTRIL) 2.5 MG tablet TAKE 1 TABLET(2.5 MG) BY MOUTH DAILY. 90 tablet 3   meclizine (ANTIVERT) 25 MG tablet Take 1 tablet (25 mg total) by mouth 3 (three) times daily as needed for dizziness. 30 tablet 0   metFORMIN (GLUCOPHAGE) 500 MG tablet TAKE ONE TABLET (500 MG) BY MOUTH TWICE DAILY WITH A MEAL. 180 tablet 0   metoprolol tartrate (LOPRESSOR) 100 MG tablet Take 1 tablet (100 mg total) by mouth 2 (two) times daily. 180 tablet 0   nitroGLYCERIN (NITROSTAT) 0.4 MG SL tablet Place 1 tablet (0.4 mg total) under the tongue every 5 (five) minutes as needed. 25 tablet 3   omeprazole (PRILOSEC) 40 MG capsule TAKE 1 CAPSULE(40 MG) BY MOUTH DAILY 30 minutes before breakfast 90 capsule 3   ondansetron (ZOFRAN) 4 MG tablet Take 1 tablet (4 mg total) by mouth every 8 (eight) hours as needed for nausea or vomiting. 30 tablet 1   potassium chloride SA (KLOR-CON) 20 MEQ tablet Take 1 tablet (20 mEq total) by mouth daily. 90 tablet 3   pseudoephedrine (SUDAFED) 30 MG tablet Take 30 mg by mouth every 6 (six) hours as needed for congestion.     rOPINIRole (REQUIP) 3 MG tablet TAKE 1 TABLET BY MOUTH EVERY NIGHT AT BEDTIME  450 tablet 0   sildenafil (VIAGRA) 100 MG tablet TAKE 1 TABLET BY MOUTH 30 MINUTES BEFORE ACTIVITY (Patient taking differently: Take 100 mg by mouth daily as needed for erectile dysfunction.) 2 tablet 0   sucralfate (CARAFATE) 1 g tablet Take one tablet po BID PRN (Patient taking differently: Take 1 g by mouth 2 (two) times daily as needed (acid reflux).) 42 tablet 5   traMADol (ULTRAM) 50 MG tablet Take 1 tablet (50 mg total) by mouth twice daily as needed for moderate pain 60 tablet 4   traZODone (DESYREL) 50 MG tablet TAKE 2 TABLETS (100 MG) BY MOUTH AT BEDTIME 180 tablet 1   No current facility-administered  medications for this visit.     Past Medical History:  Diagnosis Date   Anemia    Anxiety    Arteriosclerotic cardiovascular disease (ASCVD)    Nonobstructive; cath in 2000: 30-40% mid LAD and proximal RCA;normal EF. stress nuclear in 2006-subtle inferoseptal hypoperfusion with reversibility; negative stress EKG; good exercise tolerance   Asthma    Cancer (HCC)    skin cancer   Cataract    CHF (congestive heart failure) (HCC)    Colon polyps    30 colon polyps found on first colonoscopy   COPD (chronic obstructive pulmonary disease) (HCC)    Diabetes mellitus without complication (HCC)    Diverticulitis    DJD (degenerative joint disease)    GERD (gastroesophageal reflux disease)    History of kidney stones    Hyperlipidemia    Hypertension    Insomnia    Obstructive sleep apnea 12/2009   01/26/2010 AHI 83/hr   Paroxysmal atrial fibrillation ([place]) 10/2004   Onset in 10/2004; recurred 09/2008   PUD (peptic ulcer disease)    1980s   RLS (restless legs syndrome)    Sinusitis     Past Surgical History:  Procedure Laterality Date   BIOPSY  07/17/2018   Procedure: BIOPSY;  Surgeon: Danie Binder, MD;  [facility];  Service: Endoscopy;;  colon   BOWEL RESECTION  09/17/2018   SMALL BOWEL RESECTION: 71 CM    CARDIAC CATHETERIZATION N/A 07/21/2015   Procedure: Left Heart Cath and Coronary Angiography;  Surgeon: Peter M Martinique, MD;  Location: [facility];  Service: Cardiovascular;  Laterality: N/A;   CARDIAC CATHETERIZATION N/A 07/21/2015   Procedure: Coronary Stent Intervention;  Surgeon: Peter M Martinique, MD;  Location: [facility];  Service: Cardiovascular;  Laterality: N/A;   CIRCUMCISION N/A 04/05/2019   Procedure: CIRCUMCISION ADULT;  Surgeon: Irine Seal, MD;  Location: [facility];  Service: Urology;  Laterality: N/A;   COLONOSCOPY N/A 05/19/2014   Dr. Barnie Alderman diverticulosis/moderate external hemorrhoids, >20 simple adenomas. Genetic screening negative.     COLONOSCOPY WITH PROPOFOL N/A 07/17/2018   Dr. Oneida Alar: Diverticulosis, external/internal hemorrhoids, 32 colon polyps removed.  ten tubular adenomas removed with no high-grade dysplasia.  Advised to have surveillance colonoscopy in 3 years.   COLONOSCOPY WITH PROPOFOL N/A 06/07/2021   Procedure: COLONOSCOPY WITH PROPOFOL;  Surgeon: Eloise Harman, DO;  [facility];  Service: Endoscopy;  Laterality: N/A;  9:30 / ASA 3  (Pt was told that his time will be given at Pre-op)   ESOPHAGOGASTRODUODENOSCOPY (EGD) WITH PROPOFOL N/A 07/17/2018   Dr. Oneida Alar: Low-grade narrowing Schatzki ring at the GE junction status post dilation.  Gastritis.  Biopsy with mild nonspecific reactive gastropathy.  No H. pylori.   GIVENS CAPSULE STUDY N/A 06/24/2019   normal  HERNIA REPAIR  1986   Left inguinal   INTRAVASCULAR PRESSURE WIRE/FFR STUDY Left 06/08/2017   Procedure: INTRAVASCULAR PRESSURE WIRE/FFR STUDY;  Surgeon: Nelva Bush, MD;  Location: [facility];  Service: Cardiovascular;  Laterality: Left;  LAD and CFX   LAPAROTOMY N/A 09/17/2018   Procedure: EXPLORATORY LAPAROTOMY;  Surgeon: Virl Cagey, MD;  Location: [facility];  Service: General;  Laterality: N/A;   LEFT HEART CATH AND CORONARY ANGIOGRAPHY N/A 06/08/2017   Procedure: LEFT HEART CATH AND CORONARY ANGIOGRAPHY;  Surgeon: Nelva Bush, MD;  Location: [facility];  Service: Cardiovascular;  Laterality: N/A;   POLYPECTOMY  07/17/2018   Procedure: POLYPECTOMY;  Surgeon: Danie Binder, MD;  [facility];  Service: Endoscopy;;  colon   POLYPECTOMY  06/07/2021   Procedure: POLYPECTOMY INTESTINAL;  Surgeon: Eloise Harman, DO;  [facility];  Service: Endoscopy;;   ROTATOR CUFF REPAIR     Right   SAVORY DILATION N/A 07/17/2018   Procedure: SAVORY DILATION;  Surgeon: Danie Binder, MD;  [facility];  Service: Endoscopy;  Laterality: N/A;    Social History   Socioeconomic  History   Marital status: Married    Spouse name: Not on file   Number of children: 5   Years of education: Not on file   Highest education level: Not on file  Occupational History   Occupation: employed    Employer: [employer]: full-time  Tobacco Use   Smoking status: Every Day    Packs/day: 0.50    Years: 39.00    Pack years: 19.50    Types: Cigarettes    Start date: 07/24/1970   Smokeless tobacco: Never  Vaping Use   Vaping Use: Never used  Substance and Sexual Activity   Alcohol use: No    Alcohol/week: 0.0 standard drinks   Drug use: No   Sexual activity: Yes    Partners: Female  Other Topics Concern   Not on file  Social History Narrative   Not on file   Social Determinants of Health   Financial Resource Strain: Not on file  Food Insecurity: Not on file  Transportation Needs: Not on file  Physical Activity: Not on file  Stress: Not on file  Social Connections: Not on file  Intimate Partner Violence: Not on file    Family History  Problem Relation Age of Onset   Hypertension Mother    Breast cancer Mother 27       brain/bone    Heart attack Father    Skin cancer Sister 109   Diabetes Brother    Parkinson's disease Brother    Brain cancer Maternal Uncle    Cancer Maternal Uncle        NOS   Breast cancer Cousin        maternal cousin dx <50   Cancer Cousin    Colon cancer Neg Hx     ROS: no fevers or chills, productive cough, hemoptysis, dysphasia, odynophagia, melena, hematochezia, dysuria, hematuria, rash, seizure activity, orthopnea, PND, pedal edema, claudication. Remaining systems are negative.  Physical Exam: Well-developed well-nourished in no acute distress.  Skin is warm and dry.  HEENT is normal.  Neck is supple.  Chest is clear to auscultation with normal expansion.  Cardiovascular exam is irregular Abdominal exam nontender or distended. No masses palpated. Extremities show no edema. neuro grossly intact  ECG- personally  reviewed  A/P  1 permanent atrial fibrillation-continue beta-blocker  for rate control.  Continue apixaban.  2 coronary artery disease-he denies chest pain.  Continue statin.  He is not on aspirin given need for anticoagulation.  3 chronic diastolic congestive heart failure-patient is euvolemic today.  We will continue diuretic at present dose.  Plan  4 hyperlipidemia-continue statin and Zetia.  Last LDL was 92 but he was missing doses of atorvastatin.  Will repeat in the future.  5 hypertension-blood pressure elevated; however he has not taken his medications this morning.  He states his systolic typically runs in the 128 range.  We will continue present medications and follow.  6 tobacco abuse-patient again counseled on discontinuing.  7 obstructive sleep apnea-continue CPAP.  8 preoperative evaluation prior to ventral hernia repair-he has reasonable functional capacity with no chest pain.  He may proceed without further evaluation.  Discontinue apixaban 2 days prior to procedure and resume after when hemostasis achieved.   Kirk Ruths, MD

## 2021-06-25 ENCOUNTER — Other Ambulatory Visit (HOSPITAL_BASED_OUTPATIENT_CLINIC_OR_DEPARTMENT_OTHER): Payer: Self-pay

## 2021-06-28 ENCOUNTER — Other Ambulatory Visit: Payer: Self-pay

## 2021-06-28 ENCOUNTER — Encounter: Payer: Self-pay | Admitting: Neurology

## 2021-06-28 ENCOUNTER — Ambulatory Visit: Payer: 59 | Admitting: Neurology

## 2021-06-28 VITALS — BP 136/97 | HR 80 | Ht 68.0 in | Wt 218.0 lb

## 2021-06-28 DIAGNOSIS — R2689 Other abnormalities of gait and mobility: Secondary | ICD-10-CM

## 2021-06-28 DIAGNOSIS — G250 Essential tremor: Secondary | ICD-10-CM

## 2021-06-28 NOTE — Progress Notes (Addendum)
Subjective:    Patient ID: Shaun [name] is a 65 y.o. male.  HPI    Star Age, MD, PhD [facility] [address] [place], [place] [address]  Dear [name],   I saw your patient, Shaun [name], on your kind request dermatology clinic today for initial consultation of his tremors.  The patient is unaccompanied today. As you know, [name] is a 65 year old right-handed gentleman with an underlying complex medical history of hypertension, hyperlipidemia, congestive heart failure, A. fib, smoking, COPD, diabetes, diverticulitis, degenerative joint disease, reflux disease, kidney stones, obstructive sleep apnea, obesity, and mood disorder, abdominal hernia with surgery pending December 2022, who reports 4 to 5-year history of bilateral hand tremors.  Tremors have become worse over time.  In the past year he has noticed a lower jaw tremor.  He is not aware of any family history of tremors but his brother has Parkinson's disease and does have a tremor affecting both hands.  Patient reports problems with his balance.  He has not had any falls thankfully but does feel like sometimes he gets pulled to the left.  He has significant right hip pain and needs surgery to the hip he reports.  He does use a cane occasionally for stability.  Of note, currently he is on multiple medications including psychotropic medications.  He is currently on Abilify 2 mg daily, ropinirole 3 mg at bedtime, bupropion long-acting 450 mg daily, trazodone, 100 mg at bedtime, Lexapro 20 mg daily.  He is also on a beta-blocker, namely Metoprolol 100 mg twice daily.  He is on Eliquis.  His TSH on 02/10/2021 was 0.4.  He had recent blood work through his gastroenterologist.  His tremor is notable particularly when he reaches for something.  He does not drink any alcohol.  He smokes half a pack to three quarters of a pack per day.  He is currently not working on smoking cessation  but needs to quit he admits.  He does not drink caffeine per se, drinks decaf coffee and decaf unsweetened tea.  He does not drink a whole lot of water by self admission, maybe 1-1/2 cups to 2 cups/day on average.  He reports that [place] and had exposure to chemicals including paraquat.  His Past Medical History Is Significant For: Past Medical History:  Diagnosis Date   Anemia    Anxiety    Arteriosclerotic cardiovascular disease (ASCVD)    Nonobstructive; cath in 2000: 30-40% mid LAD and proximal RCA;normal EF. stress nuclear in 2006-subtle inferoseptal hypoperfusion with reversibility; negative stress EKG; good exercise tolerance   Asthma    Cancer (HCC)    skin cancer   Cataract    CHF (congestive heart failure) (HCC)    Colon polyps    30 colon polyps found on first colonoscopy   COPD (chronic obstructive pulmonary disease) (HCC)    Diabetes mellitus without complication (HCC)    Diverticulitis    DJD (degenerative joint disease)    GERD (gastroesophageal reflux disease)    History of kidney stones    Hyperlipidemia    Hypertension    Insomnia    Obstructive sleep apnea 12/2009   01/26/2010 AHI 83/hr   Paroxysmal atrial fibrillation ([place]) 10/2004   Onset in 10/2004; recurred 09/2008   PUD (peptic ulcer disease)    1980s   RLS (restless legs syndrome)    Sinusitis     His Past Surgical History Is Significant For:  Past Surgical History:  Procedure Laterality Date   BIOPSY  07/17/2018   Procedure: BIOPSY;  Surgeon: Danie Binder, MD;  [facility];  Service: Endoscopy;;  colon   BOWEL RESECTION  09/17/2018   SMALL BOWEL RESECTION: 71 CM    CARDIAC CATHETERIZATION N/A 07/21/2015   Procedure: Left Heart Cath and Coronary Angiography;  Surgeon: Peter M Martinique, MD;  Location: [facility];  Service: Cardiovascular;  Laterality: N/A;   CARDIAC CATHETERIZATION N/A 07/21/2015   Procedure: Coronary Stent Intervention;  Surgeon: Peter M Martinique, MD;   Location: [facility];  Service: Cardiovascular;  Laterality: N/A;   CIRCUMCISION N/A 04/05/2019   Procedure: CIRCUMCISION ADULT;  Surgeon: Irine Seal, MD;  Location: [facility];  Service: Urology;  Laterality: N/A;   COLONOSCOPY N/A 05/19/2014   Dr. Barnie Alderman diverticulosis/moderate external hemorrhoids, >20 simple adenomas. Genetic screening negative.    COLONOSCOPY WITH PROPOFOL N/A 07/17/2018   Dr. Oneida Alar: Diverticulosis, external/internal hemorrhoids, 32 colon polyps removed.  ten tubular adenomas removed with no high-grade dysplasia.  Advised to have surveillance colonoscopy in 3 years.   COLONOSCOPY WITH PROPOFOL N/A 06/07/2021   Procedure: COLONOSCOPY WITH PROPOFOL;  Surgeon: Eloise Harman, DO;  [facility];  Service: Endoscopy;  Laterality: N/A;  9:30 / ASA 3  (Pt was told that his time will be given at Pre-op)   ESOPHAGOGASTRODUODENOSCOPY (EGD) WITH PROPOFOL N/A 07/17/2018   Dr. Oneida Alar: Low-grade narrowing Schatzki ring at the GE junction status post dilation.  Gastritis.  Biopsy with mild nonspecific reactive gastropathy.  No H. pylori.   GIVENS CAPSULE STUDY N/A 06/24/2019   normal   HERNIA REPAIR  1986   Left inguinal   INTRAVASCULAR PRESSURE WIRE/FFR STUDY Left 06/08/2017   Procedure: INTRAVASCULAR PRESSURE WIRE/FFR STUDY;  Surgeon: Nelva Bush, MD;  Location: [facility];  Service: Cardiovascular;  Laterality: Left;  LAD and CFX   LAPAROTOMY N/A 09/17/2018   Procedure: EXPLORATORY LAPAROTOMY;  Surgeon: Virl Cagey, MD;  Location: [facility];  Service: General;  Laterality: N/A;   LEFT HEART CATH AND CORONARY ANGIOGRAPHY N/A 06/08/2017   Procedure: LEFT HEART CATH AND CORONARY ANGIOGRAPHY;  Surgeon: Nelva Bush, MD;  Location: [facility];  Service: Cardiovascular;  Laterality: N/A;   POLYPECTOMY  07/17/2018   Procedure: POLYPECTOMY;  Surgeon: Danie Binder, MD;  [facility];  Service: Endoscopy;;  colon   POLYPECTOMY   06/07/2021   Procedure: POLYPECTOMY INTESTINAL;  Surgeon: Eloise Harman, DO;  [facility];  Service: Endoscopy;;   ROTATOR CUFF REPAIR     Right   SAVORY DILATION N/A 07/17/2018   Procedure: SAVORY DILATION;  Surgeon: Danie Binder, MD;  [facility];  Service: Endoscopy;  Laterality: N/A;    His Family History Is Significant For: Family History  Problem Relation Age of Onset   Hypertension Mother    Breast cancer Mother 36       brain/bone    Heart attack Father    Skin cancer Sister 61   Diabetes Brother    Parkinson's disease Brother    Brain cancer Maternal Uncle    Cancer Maternal Uncle        NOS   Breast cancer Cousin        maternal cousin dx <50   Cancer Cousin    Colon cancer Neg Hx     His Social History Is Significant For: Social History   Socioeconomic  History   Marital status: Married    Spouse name: Not on file   Number of children: 5   Years of education: Not on file   Highest education level: Not on file  Occupational History   Occupation: employed    Employer: [employer]: full-time  Tobacco Use   Smoking status: Every Day    Packs/day: 0.50    Years: 39.00    Pack years: 19.50    Types: Cigarettes    Start date: 07/24/1970   Smokeless tobacco: Never  Vaping Use   Vaping Use: Never used  Substance and Sexual Activity   Alcohol use: No    Alcohol/week: 0.0 standard drinks   Drug use: No   Sexual activity: Yes    Partners: Female  Other Topics Concern   Not on file  Social History Narrative   Not on file   Social Determinants of Health   Financial Resource Strain: Not on file  Food Insecurity: Not on file  Transportation Needs: Not on file  Physical Activity: Not on file  Stress: Not on file  Social Connections: Not on file    His Allergies Are:  No Known Allergies:   His Current Medications Are:  Outpatient Encounter Medications as of 06/28/2021  Medication Sig   albuterol (VENTOLIN HFA)  108 (90 Base) MCG/ACT inhaler INHALE 2 PUFFS INTO THE LUNGS EVERY 6 HOURS AS NEEDED FOR WHEEZING OR SHORTNESS OF BREATH   apixaban (ELIQUIS) 5 MG TABS tablet TAKE 1 TABLET(5 MG) BY MOUTH TWICE DAILY   ARIPiprazole (ABILIFY) 2 MG tablet Take 1 tablet (2 mg total) by mouth daily.   atorvastatin (LIPITOR) 80 MG tablet Take 1 tablet (80 mg total) by mouth daily. TAKE 1 TABLET(80 MG) BY MOUTH EVERY EVENING   buPROPion (WELLBUTRIN XL) 150 MG 24 hr tablet Take 3 tablets once daily (Patient taking differently: Take 150 mg by mouth 3 (three) times daily with meals.)   docusate sodium (COLACE) 100 MG capsule Take 100 mg by mouth daily as needed for mild constipation.   escitalopram (LEXAPRO) 20 MG tablet TAKE 1 TABLET(20 MG) BY MOUTH DAILY   ezetimibe (ZETIA) 10 MG tablet TAKE 1 TABLET BY MOUTH EVERY DAY   ferrous sulfate 325 (65 FE) MG tablet Take 325 mg by mouth daily with breakfast.   furosemide (LASIX) 40 MG tablet Take 1 tablet (40 mg total) by mouth daily.   lisinopril (ZESTRIL) 2.5 MG tablet TAKE 1 TABLET(2.5 MG) BY MOUTH DAILY.   meclizine (ANTIVERT) 25 MG tablet Take 1 tablet (25 mg total) by mouth 3 (three) times daily as needed for dizziness.   metFORMIN (GLUCOPHAGE) 500 MG tablet TAKE ONE TABLET (500 MG) BY MOUTH TWICE DAILY WITH A MEAL.   metoprolol tartrate (LOPRESSOR) 100 MG tablet Take 1 tablet (100 mg total) by mouth 2 (two) times daily.   nitroGLYCERIN (NITROSTAT) 0.4 MG SL tablet Place 1 tablet (0.4 mg total) under the tongue every 5 (five) minutes as needed.   omeprazole (PRILOSEC) 40 MG capsule TAKE 1 CAPSULE(40 MG) BY MOUTH DAILY 30 minutes before breakfast   ondansetron (ZOFRAN) 4 MG tablet Take 1 tablet (4 mg total) by mouth every 8 (eight) hours as needed for nausea or vomiting.   potassium chloride SA (KLOR-CON) 20 MEQ tablet Take 1 tablet (20 mEq total) by mouth daily.   pseudoephedrine (SUDAFED) 30 MG tablet Take 30 mg by mouth every 6 (six) hours as needed for congestion.    rOPINIRole (REQUIP)  3 MG tablet TAKE 1 TABLET BY MOUTH EVERY NIGHT AT BEDTIME   sildenafil (VIAGRA) 100 MG tablet TAKE 1 TABLET BY MOUTH 30 MINUTES BEFORE ACTIVITY (Patient taking differently: Take 100 mg by mouth daily as needed for erectile dysfunction.)   sucralfate (CARAFATE) 1 g tablet Take one tablet po BID PRN (Patient taking differently: Take 1 g by mouth 2 (two) times daily as needed (acid reflux).)   traMADol (ULTRAM) 50 MG tablet Take 1 tablet (50 mg total) by mouth twice daily as needed for moderate pain   traZODone (DESYREL) 50 MG tablet TAKE 2 TABLETS (100 MG) BY MOUTH AT BEDTIME   No facility-administered encounter medications on file as of 06/28/2021.  :   Review of Systems:  Out of a complete 14 point review of systems, all are reviewed and negative with the exception of these symptoms as listed below:   Review of Systems  Neurological:        Pt is here for tremors. Pt states his tremors are in both hands and Jaw. Pt this has been going on for about a year.Pt states that tremors increase when he tries to pick up something or when he writes    Objective:  Neurological Exam  Physical Exam Physical Examination:   Vitals:   06/28/21 0806  BP: (!) 136/97  Pulse: 80    General Examination: The patient is a very pleasant 65 y.o. male in no acute distress. He appears well-developed and well-nourished and well groomed.   HEENT: Normocephalic, atraumatic, pupils are equal, round and reactive to light, extraocular tracking is good without limitation to gaze excursion or nystagmus noted. Hearing is grossly intact. Face is symmetric with normal facial animation and normal facial sensation to light touch, temperature and vibration.  He has no obvious dysarthria or voice tremor.  He has a fairly consistent lower jaw tremor, neck is supple with full range of motion, no carotid bruits. Oropharynx exam reveals: mild mouth dryness, adequate dental hygiene. Tongue protrudes centrally  and palate elevates symmetrically.   Chest: Clear to auscultation without wheezing, rhonchi or crackles noted.  Heart: S1+S2+0, slightly irregular.    Abdomen: Soft, non-tender and non-distended, hernia protuberance noted in the right anterior abdominal area.  Extremities: There is no pitting edema in the distal lower extremities bilaterally.   Skin: Warm and dry without trophic changes noted.   Musculoskeletal: exam reveals decreased range of motion in the right hip.    Neurologically:  Mental status: The patient is awake, alert and oriented in all 4 spheres. His immediate and remote memory, attention, language skills and fund of knowledge are appropriate. There is no evidence of aphasia, agnosia, apraxia or anomia. Speech is clear with normal prosody and enunciation. Thought process is linear. Mood is normal and affect is normal.  Cranial nerves II - XII are as described above under HEENT exam.  Motor exam: Normal bulk, strength and tone is noted. There is no drift or rebound.   On 06/28/2021: on Archimedes spiral drawing he has mild to moderate trembling with the left hand, mild to moderate trembling with the right hand, handwriting is tremulous, somewhat difficult to read, not micrographic.   He has a mild postural tremor in both upper extremities, mild action tremor, no significant intention tremor.  Slight resting tremor noted in both hands intermittently, particularly in the thumb areas.  Romberg is negative but reports insecurity with his eyes closed. Reflexes are 1+ throughout.  Toes are downgoing bilaterally.  Fine motor  skills and coordination: grossly intact with finger taps, hand movements and sleeping helps but he does have trembling with finger taps, no significant decrement in amplitude noted, no lateralization noted.  Cerebellar testing: No dysmetria or intention tremor. There is no truncal or gait ataxia.  Heel-to-shin unremarkable. Sensory exam: intact to light touch,  temperature, and vibration sense in the upper and lower extremities.  Gait, station and balance: He stands without difficulty, reports right hip pain and walks with a limp on the right, no shuffling, preserved arm swing noted.  He has no walking aid.    Assessment and Plan:   In summary, MATEUSZ NEILAN is a very pleasant 65 y.o.-year old male with an underlying complex medical history of hypertension, hyperlipidemia, congestive heart failure, A. fib, smoking, COPD, diabetes, diverticulitis, degenerative joint disease, reflux disease, kidney stones, obstructive sleep apnea, obesity, and mood disorder, abdominal hernia with surgery pending December 2022, who presents for evaluation of his tremor disorder of approximately 4 to 5 years duration.  He has bilateral hand tremors with a postural and action component and a slight resting component as well as a lower jaw tremor.  No evidence of parkinsonism.  He is reassured in that regard.  His history and examination supports a diagnosis of essential tremor.  He is advised that certain medications can contribute or exacerbate tremors, in particular, Abilify and Lexapro in his case.  Abilify can also cause parkinsonism.  He is advised that certain triggers exist that make tremors worse including dehydration, thyroid dysfunction and for example.  He does not typically overdo caffeine and drinks mostly decaf drinks.  He does not hydrate very well and is encouraged to drink more water if possible.  He is advised that there are only a few symptomatic medications for this type of tremor and he is already on a beta-blocker which we do utilize for symptomatic tremor control.  I would not necessarily recommend increasing his beta-blocker at this time.  We also use Mysoline for tremor control but he is advised that Mysoline can cause balance issues.  It may also have an interaction with his Eliquis unfortunately.  At this moment, I do not recommend any medications to treat  his tremor.  I did suggest we proceed with a brain MRI to rule out a structural cause of his tremor.  He is advised to get his chemistry panel checked today to make sure his kidney function is okay to proceed with the brain MRI with and without contrast.  He is agreeable to this.  We will call him with his MRI results and so long as the MRI shows age-appropriate and nonacute findings, we will play it by ear and follow him in this clinic on an as-needed basis.  He is encouraged to work on smoking cessation, good hydration and encouraged to use his cane for gait safety.   I answered all his questions today and he was in agreement with the plan.  Thank you very much for allowing me to participate in the care of this nice patient. If I can be of any further assistance to you please do not hesitate to call me at [phone].  Sincerely,   Star Age, MD, PhD

## 2021-06-28 NOTE — Telephone Encounter (Signed)
Opened in error

## 2021-06-28 NOTE — Patient Instructions (Signed)
It was nice to meet you today.  You have a tremor affecting both hands and the lower jaw area.  Your history and examination supports a diagnosis of essential tremor.   I do not see any signs or symptoms of parkinson's like disease or what we call parkinsonism.   For your tremor, I would not recommend any new medication for fear of side effects (especially sleepiness and balance problems) or medication interactions, especially in light of your current list of medications. We will do a brain scan, called MRI and call you with the test results. We will have to schedule you for this on a separate date. This test requires authorization from your insurance, and we will take care of the insurance process.  Please remember, that any kind of tremor may be exacerbated by anxiety, anger, nervousness, excitement, thyroid disease, dehydration, sleep deprivation, by caffeine, and low blood sugar values or blood sugar fluctuations. Some medications can exacerbate tremors, this includes Abilify and Lexapro.  So long as your brain MRI shows no acute findings or no structural cause for your tremor or balance issues, we can see you in this clinic on an as-needed basis.

## 2021-06-29 ENCOUNTER — Other Ambulatory Visit (HOSPITAL_BASED_OUTPATIENT_CLINIC_OR_DEPARTMENT_OTHER): Payer: Self-pay

## 2021-06-29 ENCOUNTER — Ambulatory Visit (INDEPENDENT_AMBULATORY_CARE_PROVIDER_SITE_OTHER): Payer: 59 | Admitting: Cardiology

## 2021-06-29 ENCOUNTER — Telehealth: Payer: Self-pay | Admitting: *Deleted

## 2021-06-29 ENCOUNTER — Other Ambulatory Visit: Payer: Self-pay

## 2021-06-29 ENCOUNTER — Encounter: Payer: Self-pay | Admitting: Cardiology

## 2021-06-29 VITALS — BP 162/70 | HR 81 | Ht 68.0 in | Wt 218.0 lb

## 2021-06-29 DIAGNOSIS — I4821 Permanent atrial fibrillation: Secondary | ICD-10-CM | POA: Diagnosis not present

## 2021-06-29 DIAGNOSIS — E785 Hyperlipidemia, unspecified: Secondary | ICD-10-CM | POA: Diagnosis not present

## 2021-06-29 DIAGNOSIS — Z01810 Encounter for preprocedural cardiovascular examination: Secondary | ICD-10-CM

## 2021-06-29 DIAGNOSIS — I2510 Atherosclerotic heart disease of native coronary artery without angina pectoris: Secondary | ICD-10-CM

## 2021-06-29 DIAGNOSIS — Z9861 Coronary angioplasty status: Secondary | ICD-10-CM | POA: Diagnosis not present

## 2021-06-29 LAB — COMPREHENSIVE METABOLIC PANEL
ALT: 27 IU/L (ref 0–44)
AST: 20 IU/L (ref 0–40)
Albumin/Globulin Ratio: 1.7 (ref 1.2–2.2)
Albumin: 3.8 g/dL (ref 3.8–4.8)
Alkaline Phosphatase: 111 IU/L (ref 44–121)
BUN/Creatinine Ratio: 17 (ref 10–24)
BUN: 15 mg/dL (ref 8–27)
Bilirubin Total: 0.5 mg/dL (ref 0.0–1.2)
CO2: 24 mmol/L (ref 20–29)
Calcium: 8.6 mg/dL (ref 8.6–10.2)
Chloride: 106 mmol/L (ref 96–106)
Creatinine, Ser: 0.89 mg/dL (ref 0.76–1.27)
Globulin, Total: 2.2 g/dL (ref 1.5–4.5)
Glucose: 126 mg/dL — ABNORMAL HIGH (ref 70–99)
Potassium: 4.2 mmol/L (ref 3.5–5.2)
Sodium: 144 mmol/L (ref 134–144)
Total Protein: 6 g/dL (ref 6.0–8.5)
eGFR: 96 mL/min/{1.73_m2} (ref >59)

## 2021-06-29 MED ORDER — SILDENAFIL CITRATE 100 MG PO TABS
ORAL_TABLET | ORAL | 0 refills | Status: DC
  Filled 2021-06-29: qty 6, 30d supply, fill #0
  Filled 2021-07-29: qty 2, 10d supply, fill #1

## 2021-06-29 NOTE — Telephone Encounter (Signed)
Spoke with patient and advised his labs are fine including kidney function.  Okay to proceed with brain MRI that will include contrast.  Patient verbalized understanding and appreciation for the call.

## 2021-06-29 NOTE — Telephone Encounter (Signed)
-----   Message from Star Age, MD sent at 06/29/2021  8:17 AM EDT ----- Labs are benign including kidney function, okay to proceed with the brain MRI with and without contrast as planned.  Please update patient.

## 2021-06-29 NOTE — Patient Instructions (Signed)

## 2021-06-30 ENCOUNTER — Telehealth: Payer: Self-pay | Admitting: Neurology

## 2021-06-30 NOTE — Telephone Encounter (Signed)
LVM for pt to call back to schedule  [facility] auth: [place] Ref # [id number]

## 2021-07-07 NOTE — Telephone Encounter (Signed)
Patient returned my call he is scheduled at [facility] for 07/14/21.

## 2021-07-08 ENCOUNTER — Other Ambulatory Visit (HOSPITAL_BASED_OUTPATIENT_CLINIC_OR_DEPARTMENT_OTHER): Payer: Self-pay

## 2021-07-13 NOTE — Telephone Encounter (Signed)
Patient informed me he also has Medicare, I informed him he will not be able to have his MRI here in the office because we do not do medicare. I informed him I will send the order to [facility] and they will reach out to the patient to schedule.

## 2021-07-14 ENCOUNTER — Other Ambulatory Visit: Payer: 59

## 2021-07-20 ENCOUNTER — Other Ambulatory Visit (HOSPITAL_BASED_OUTPATIENT_CLINIC_OR_DEPARTMENT_OTHER): Payer: Self-pay

## 2021-07-20 ENCOUNTER — Other Ambulatory Visit: Payer: Self-pay | Admitting: Cardiology

## 2021-07-20 DIAGNOSIS — I2510 Atherosclerotic heart disease of native coronary artery without angina pectoris: Secondary | ICD-10-CM

## 2021-07-20 DIAGNOSIS — Z9861 Coronary angioplasty status: Secondary | ICD-10-CM

## 2021-07-21 ENCOUNTER — Other Ambulatory Visit (HOSPITAL_BASED_OUTPATIENT_CLINIC_OR_DEPARTMENT_OTHER): Payer: Self-pay

## 2021-07-21 MED ORDER — EZETIMIBE 10 MG PO TABS
ORAL_TABLET | Freq: Every day | ORAL | 3 refills | Status: DC
  Filled 2021-07-21: qty 90, 90d supply, fill #0
  Filled 2021-10-18: qty 90, 90d supply, fill #1
  Filled 2022-01-25: qty 90, 90d supply, fill #2
  Filled 2022-05-04: qty 90, 90d supply, fill #3

## 2021-07-28 ENCOUNTER — Other Ambulatory Visit: Payer: Self-pay | Admitting: Registered Nurse

## 2021-07-28 DIAGNOSIS — I4821 Permanent atrial fibrillation: Secondary | ICD-10-CM

## 2021-07-29 ENCOUNTER — Other Ambulatory Visit (HOSPITAL_BASED_OUTPATIENT_CLINIC_OR_DEPARTMENT_OTHER): Payer: Self-pay

## 2021-07-29 ENCOUNTER — Ambulatory Visit: Payer: 59 | Admitting: Internal Medicine

## 2021-07-29 ENCOUNTER — Other Ambulatory Visit: Payer: Self-pay | Admitting: Cardiology

## 2021-07-29 MED ORDER — METOPROLOL TARTRATE 100 MG PO TABS
100.0000 mg | ORAL_TABLET | Freq: Two times a day (BID) | ORAL | 0 refills | Status: DC
Start: 2021-07-29 — End: 2021-08-30
  Filled 2021-07-29: qty 180, 90d supply, fill #0

## 2021-07-30 ENCOUNTER — Other Ambulatory Visit (HOSPITAL_BASED_OUTPATIENT_CLINIC_OR_DEPARTMENT_OTHER): Payer: Self-pay

## 2021-07-30 MED ORDER — SILDENAFIL CITRATE 100 MG PO TABS: ORAL_TABLET | ORAL | 0 refills | Status: DC

## 2021-08-01 ENCOUNTER — Telehealth: Payer: 59 | Admitting: Family

## 2021-08-01 DIAGNOSIS — J45909 Unspecified asthma, uncomplicated: Secondary | ICD-10-CM

## 2021-08-01 DIAGNOSIS — R6889 Other general symptoms and signs: Secondary | ICD-10-CM | POA: Diagnosis not present

## 2021-08-01 DIAGNOSIS — E119 Type 2 diabetes mellitus without complications: Secondary | ICD-10-CM

## 2021-08-01 DIAGNOSIS — J441 Chronic obstructive pulmonary disease with (acute) exacerbation: Secondary | ICD-10-CM | POA: Diagnosis not present

## 2021-08-01 MED ORDER — BENZONATATE 100 MG PO CAPS: 100.0000 mg | ORAL_CAPSULE | Freq: Three times a day (TID) | ORAL | 0 refills | Status: DC | PRN

## 2021-08-01 MED ORDER — ALBUTEROL SULFATE HFA 108 (90 BASE) MCG/ACT IN AERS: INHALATION_SPRAY | RESPIRATORY_TRACT | 11 refills | Status: DC

## 2021-08-01 MED ORDER — PREDNISONE 10 MG (21) PO TBPK: ORAL_TABLET | ORAL | 0 refills | Status: DC

## 2021-08-01 NOTE — Progress Notes (Signed)
Virtual Visit Consent   Shaun [name], you are scheduled for a virtual visit with a [facility] provider today.     Just as with appointments in the office, your consent must be obtained to participate.  Your consent will be active for this visit and any virtual visit you may have with one of our providers in the next 365 days.     If you have a MyChart account, a copy of this consent can be sent to you electronically.  All virtual visits are billed to your insurance company just like a traditional visit in the office.    As this is a virtual visit, video technology does not allow for your provider to perform a traditional examination.  This may limit your provider's ability to fully assess your condition.  If your provider identifies any concerns that need to be evaluated in person or the need to arrange testing (such as labs, EKG, etc.), we will make arrangements to do so.     Although advances in technology are sophisticated, we cannot ensure that it will always work on either your end or our end.  If the connection with a video visit is poor, the visit may have to be switched to a telephone visit.  With either a video or telephone visit, we are not always able to ensure that we have a secure connection.     I need to obtain your verbal consent now.   Are you willing to proceed with your visit today?    Shaun [name] has provided verbal consent on 08/01/2021 for a virtual visit (video or telephone).   Evelina Dun, FNP   Date: 08/01/2021 8:23 AM   Virtual Visit via Video Note   I, Evelina Dun, connected with  Shaun [name]  ([id number], February 06, 1956) on 08/01/21 at  8:15 AM EST by a video-enabled telemedicine application and verified that I am speaking with the correct person using two identifiers.  Location: Patient: Virtual Visit Location Patient: Home Provider: Virtual Visit Location Provider: Home Office   I discussed the limitations of evaluation and management by  telemedicine and the availability of in person appointments. The patient expressed understanding and agreed to proceed.    History of Present Illness: Shaun [name] is a 65 y.o. who identifies as a male who was assigned male at birth, and is being seen today for cough and congestion. He had a negative COVID test at home. He has DM that is controlled and his last A1C was 6.8. He has COPD and Asthma that he uses albuterol as needed.   HPI: Cough This is a new problem. The current episode started in the past 7 days. The problem has been gradually worsening. The problem occurs every few minutes. The cough is Productive of sputum. Associated symptoms include chills, a fever, headaches, myalgias, nasal congestion, postnasal drip, shortness of breath and wheezing. Pertinent negatives include no ear congestion or ear pain. Risk factors for lung disease include smoking/tobacco exposure. He has tried rest, steroid inhaler and OTC cough suppressant for the symptoms. The treatment provided mild relief.   Problems:  Patient Active Problem List   Diagnosis Date Noted   Ventral hernia 12/31/2020   Nausea with vomiting 12/31/2019   Dyslipidemia, goal LDL below 70 06/18/2019   Hx of adenomatous colonic polyps 10/17/2018   Anemia 10/17/2018   Phimosis of penis 09/27/2018   Osteoarthritis of right hip 06/20/2018   Esophageal dysphagia 05/21/2018   GERD (gastroesophageal reflux disease)  05/21/2018   RLS (restless legs syndrome)    PUD (peptic ulcer disease)    Insomnia    Chronic diastolic CHF (congestive heart failure) (HCC)    Asthma    Arteriosclerotic cardiovascular disease (ASCVD)    Chronic anticoagulation 01/27/2017   Depression 09/24/2015   COPD (chronic obstructive pulmonary disease) ([place]) 07/20/2015   Accelerating angina ([place]) 07/19/2015   Genetic testing 09/09/2014   H/O adenomatous polyp of colon 08/11/2014   Non-insulin treated type 2 diabetes mellitus ([place]) 02/09/2011   CAD S/P  percutaneous coronary angioplasty    Tobacco abuse    Essential hypertension    Chronic atrial fibrillation ([place]) 02/23/2010   Obstructive sleep apnea 12/27/2009    Allergies: No Known Allergies Medications:  Current Outpatient Medications:    benzonatate (TESSALON PERLES) 100 MG capsule, Take 1 capsule (100 mg total) by mouth 3 (three) times daily as needed., Disp: 20 capsule, Rfl: 0   predniSONE (STERAPRED UNI-PAK 21 TAB) 10 MG (21) TBPK tablet, Use as directed, Disp: 21 tablet, Rfl: 0   albuterol (VENTOLIN HFA) 108 (90 Base) MCG/ACT inhaler, INHALE 2 PUFFS INTO THE LUNGS EVERY 6 HOURS AS NEEDED FOR WHEEZING OR SHORTNESS OF BREATH, Disp: 18 g, Rfl: 11   apixaban (ELIQUIS) 5 MG TABS tablet, TAKE 1 TABLET(5 MG) BY MOUTH TWICE DAILY, Disp: 180 tablet, Rfl: 1   ARIPiprazole (ABILIFY) 2 MG tablet, Take 1 tablet (2 mg total) by mouth daily., Disp: 90 tablet, Rfl: 0   atorvastatin (LIPITOR) 80 MG tablet, Take 1 tablet (80 mg total) by mouth daily. TAKE 1 TABLET(80 MG) BY MOUTH EVERY EVENING, Disp: 90 tablet, Rfl: 3   buPROPion (WELLBUTRIN XL) 150 MG 24 hr tablet, Take 3 tablets once daily (Patient taking differently: Take 150 mg by mouth 3 (three) times daily with meals.), Disp: 270 tablet, Rfl: 3   docusate sodium (COLACE) 100 MG capsule, Take 100 mg by mouth daily as needed for mild constipation., Disp: , Rfl:    escitalopram (LEXAPRO) 20 MG tablet, TAKE 1 TABLET(20 MG) BY MOUTH DAILY, Disp: 90 tablet, Rfl: 0   ezetimibe (ZETIA) 10 MG tablet, TAKE 1 TABLET BY MOUTH EVERY DAY, Disp: 90 tablet, Rfl: 3   ferrous sulfate 325 (65 FE) MG tablet, Take 325 mg by mouth daily with breakfast., Disp: , Rfl:    furosemide (LASIX) 40 MG tablet, Take 1 tablet (40 mg total) by mouth daily., Disp: 90 tablet, Rfl: 3   lisinopril (ZESTRIL) 2.5 MG tablet, TAKE 1 TABLET(2.5 MG) BY MOUTH DAILY., Disp: 90 tablet, Rfl: 3   meclizine (ANTIVERT) 25 MG tablet, Take 1 tablet (25 mg total) by mouth 3 (three) times daily as  needed for dizziness., Disp: 30 tablet, Rfl: 0   metFORMIN (GLUCOPHAGE) 500 MG tablet, TAKE ONE TABLET (500 MG) BY MOUTH TWICE DAILY WITH A MEAL., Disp: 180 tablet, Rfl: 0   metoprolol tartrate (LOPRESSOR) 100 MG tablet, Take 1 tablet (100 mg total) by mouth 2 (two) times daily., Disp: 180 tablet, Rfl: 0   nitroGLYCERIN (NITROSTAT) 0.4 MG SL tablet, Place 1 tablet (0.4 mg total) under the tongue every 5 (five) minutes as needed., Disp: 25 tablet, Rfl: 3   omeprazole (PRILOSEC) 40 MG capsule, Take 1 capsule by mouth once daily 30 minutes before breakfast, Disp: 90 capsule, Rfl: 3   ondansetron (ZOFRAN) 4 MG tablet, Take 1 tablet (4 mg total) by mouth every 8 (eight) hours as needed for nausea or vomiting., Disp: 30 tablet, Rfl: 1   potassium  chloride SA (KLOR-CON) 20 MEQ tablet, Take 1 tablet (20 mEq total) by mouth daily., Disp: 90 tablet, Rfl: 3   pseudoephedrine (SUDAFED) 30 MG tablet, Take 30 mg by mouth every 6 (six) hours as needed for congestion., Disp: , Rfl:    rOPINIRole (REQUIP) 3 MG tablet, TAKE 1 TABLET BY MOUTH EVERY NIGHT AT BEDTIME, Disp: 450 tablet, Rfl: 0   sildenafil (VIAGRA) 100 MG tablet, TAKE 1 TABLET BY MOUTH 30 MINUTES BEFORE ACTIVITY, Disp: 8 tablet, Rfl: 0   sucralfate (CARAFATE) 1 g tablet, Take one tablet po BID PRN (Patient taking differently: Take 1 g by mouth 2 (two) times daily as needed (acid reflux).), Disp: 42 tablet, Rfl: 5   traMADol (ULTRAM) 50 MG tablet, Take 1 tablet (50 mg total) by mouth twice daily as needed for moderate pain, Disp: 60 tablet, Rfl: 4   traZODone (DESYREL) 50 MG tablet, TAKE 2 TABLETS (100 MG) BY MOUTH AT BEDTIME, Disp: 180 tablet, Rfl: 1  Observations/Objective: Patient is well-developed, well-nourished in no acute distress.  Resting comfortably  at home.  Head is normocephalic, atraumatic.  No labored breathing.  Speech is clear and coherent with logical content.  Patient is alert and oriented at baseline.  Intermittent coarse  cough  Assessment and Plan: 1. COPD exacerbation (HCC) - albuterol (VENTOLIN HFA) 108 (90 Base) MCG/ACT inhaler; INHALE 2 PUFFS INTO THE LUNGS EVERY 6 HOURS AS NEEDED FOR WHEEZING OR SHORTNESS OF BREATH  Dispense: 18 g; Refill: 11 - benzonatate (TESSALON PERLES) 100 MG capsule; Take 1 capsule (100 mg total) by mouth 3 (three) times daily as needed.  Dispense: 20 capsule; Refill: 0 - predniSONE (STERAPRED UNI-PAK 21 TAB) 10 MG (21) TBPK tablet; Use as directed  Dispense: 21 tablet; Refill: 0  2. Flu-like symptoms - benzonatate (TESSALON PERLES) 100 MG capsule; Take 1 capsule (100 mg total) by mouth 3 (three) times daily as needed.  Dispense: 20 capsule; Refill: 0 - predniSONE (STERAPRED UNI-PAK 21 TAB) 10 MG (21) TBPK tablet; Use as directed  Dispense: 21 tablet; Refill: 0  3. Non-insulin treated type 2 diabetes mellitus ([place])  4. Moderate asthma without complication, unspecified whether persistent   Rest Force fluids Strict low carb, continue to monitor glucose while taking prednisone  Tylenol as needed Albuterol as needed  Mucinex BID Follow up if symptoms worsen or do not improve  Follow Up Instructions: I discussed the assessment and treatment plan with the patient. The patient was provided an opportunity to ask questions and all were answered. The patient agreed with the plan and demonstrated an understanding of the instructions.  A copy of instructions were sent to the patient via MyChart unless otherwise noted below.     The patient was advised to call back or seek an in-person evaluation if the symptoms worsen or if the condition fails to improve as anticipated.  Time:  I spent 12 minutes with the patient via telehealth technology discussing the above problems/concerns.    Evelina Dun, FNP

## 2021-08-01 NOTE — Patient Instructions (Signed)
Influenza, Adult °Influenza, also called "the flu," is a viral infection that mainly affects the respiratory tract. This includes the lungs, nose, and throat. The flu spreads easily from person to person (is contagious). It causes common cold symptoms, along with high fever and body aches. °What are the causes? °This condition is caused by the influenza virus. You can get the virus by: °Breathing in droplets that are in the air from an infected person's cough or sneeze. °Touching something that has the virus on it (has been contaminated) and then touching your mouth, nose, or eyes. °What increases the risk? °The following factors may make you more likely to get the flu: °Not washing or sanitizing your hands often. °Having close contact with many people during cold and flu season. °Touching your mouth, eyes, or nose without first washing or sanitizing your hands. °Not getting an annual flu shot. °You may have a higher risk for the flu, including serious problems, such as a lung infection (pneumonia), if you: °Are older than 65. °Are pregnant. °Have a weakened disease-fighting system (immune system). This includes people who have HIV or AIDS, are on chemotherapy, or are taking medicines that reduce (suppress) the immune system. °Have a long-term (chronic) illness, such as heart disease, kidney disease, diabetes, or lung disease. °Have a liver disorder. °Are severely overweight (morbidly obese). °Have anemia. °Have asthma. °What are the signs or symptoms? °Symptoms of this condition usually begin suddenly and last 4-14 days. These may include: °Fever and chills. °Headaches, body aches, or muscle aches. °Sore throat. °Cough. °Runny or stuffy (congested) nose. °Chest discomfort. °Poor appetite. °Weakness or fatigue. °Dizziness. °Nausea or vomiting. °How is this diagnosed? °This condition may be diagnosed based on: °Your symptoms and medical history. °A physical exam. °Swabbing your nose or throat and testing the fluid  for the influenza virus. °How is this treated? °If the flu is diagnosed early, you can be treated with antiviral medicine that is given by mouth (orally) or through an IV. This can help reduce how severe the illness is and how long it lasts. °Taking care of yourself at home can help relieve symptoms. Your health care provider may recommend: °Taking over-the-counter medicines. °Drinking plenty of fluids. °In many cases, the flu goes away on its own. If you have severe symptoms or complications, you may be treated in a hospital. °Follow these instructions at home: °Activity °Rest as needed and get plenty of sleep. °Stay home from work or school as told by your health care provider. Unless you are visiting your health care provider, avoid leaving home until your fever has been gone for 24 hours without taking medicine. °Eating and drinking °Take an oral rehydration solution (ORS). This is a drink that is sold at pharmacies and retail stores. °Drink enough fluid to keep your urine pale yellow. °Drink clear fluids in small amounts as you are able. Clear fluids include water, ice chips, fruit juice mixed with water, and low-calorie sports drinks. °Eat bland, easy-to-digest foods in small amounts as you are able. These foods include bananas, applesauce, rice, lean meats, toast, and crackers. °Avoid drinking fluids that contain a lot of sugar or caffeine, such as energy drinks, regular sports drinks, and soda. °Avoid alcohol. °Avoid spicy or fatty foods. °General instructions °  °Take over-the-counter and prescription medicines only as told by your health care provider. °Use a cool mist humidifier to add humidity to the air in your home. This can make it easier to breathe. °When using a cool mist humidifier,   clean it daily. Empty the water and replace it with clean water. °Cover your mouth and nose when you cough or sneeze. °Wash your hands with soap and water often and for at least 20 seconds, especially after you cough or  sneeze. If soap and water are not available, use alcohol-based hand sanitizer. °Keep all follow-up visits. This is important. °How is this prevented? ° °Get an annual flu shot. This is usually available in late summer, fall, or winter. Ask your health care provider when you should get your flu shot. °Avoid contact with people who are sick during cold and flu season. This is generally fall and winter. °Contact a health care provider if: °You develop new symptoms. °You have: °Chest pain. °Diarrhea. °A fever. °Your cough gets worse. °You produce more mucus. °You feel nauseous or you vomit. °Get help right away if you: °Develop shortness of breath or have difficulty breathing. °Have skin or nails that turn a bluish color. °Have severe pain or stiffness in your neck. °Develop a sudden headache or sudden pain in your face or ear. °Cannot eat or drink without vomiting. °These symptoms may represent a serious problem that is an emergency. Do not wait to see if the symptoms will go away. Get medical help right away. Call your local emergency services (911 in the [place] Do not drive yourself to the hospital. °Summary °Influenza, also called "the flu," is a viral infection that primarily affects your respiratory tract. °Symptoms of the flu usually begin suddenly and last 4-14 days. °Getting an annual flu shot is the best way to prevent getting the flu. °Stay home from work or school as told by your health care provider. Unless you are visiting your health care provider, avoid leaving home until your fever has been gone for 24 hours without taking medicine. °Keep all follow-up visits. This is important. °This information is not intended to replace advice given to you by your health care provider. Make sure you discuss any questions you have with your health care provider. °Document Revised: 04/03/2020 Document Reviewed: 04/03/2020 °[employer]vier Patient Education © 2022 [employer] ° °

## 2021-08-04 ENCOUNTER — Ambulatory Visit: Payer: Self-pay | Admitting: Surgery

## 2021-08-04 ENCOUNTER — Other Ambulatory Visit: Payer: 59

## 2021-08-04 DIAGNOSIS — K439 Ventral hernia without obstruction or gangrene: Secondary | ICD-10-CM

## 2021-08-06 ENCOUNTER — Other Ambulatory Visit: Payer: Self-pay

## 2021-08-06 ENCOUNTER — Inpatient Hospital Stay (HOSPITAL_COMMUNITY)
Admission: EM | Admit: 2021-08-06 | Discharge: 2021-08-25 | DRG: 870 | Disposition: A | Discharge status: Rehabilitation Facility | Payer: 59 | Attending: Internal Medicine | Admitting: Internal Medicine

## 2021-08-06 ENCOUNTER — Emergency Department (HOSPITAL_COMMUNITY): Payer: 59

## 2021-08-06 ENCOUNTER — Encounter (HOSPITAL_COMMUNITY): Payer: Self-pay | Admitting: Emergency Medicine

## 2021-08-06 DIAGNOSIS — I10 Essential (primary) hypertension: Secondary | ICD-10-CM | POA: Diagnosis present

## 2021-08-06 DIAGNOSIS — I4821 Permanent atrial fibrillation: Secondary | ICD-10-CM | POA: Diagnosis present

## 2021-08-06 DIAGNOSIS — F05 Delirium due to known physiological condition: Secondary | ICD-10-CM | POA: Diagnosis not present

## 2021-08-06 DIAGNOSIS — I2510 Atherosclerotic heart disease of native coronary artery without angina pectoris: Secondary | ICD-10-CM | POA: Diagnosis present

## 2021-08-06 DIAGNOSIS — Z79899 Other long term (current) drug therapy: Secondary | ICD-10-CM

## 2021-08-06 DIAGNOSIS — J159 Unspecified bacterial pneumonia: Secondary | ICD-10-CM | POA: Diagnosis not present

## 2021-08-06 DIAGNOSIS — Z8249 Family history of ischemic heart disease and other diseases of the circulatory system: Secondary | ICD-10-CM

## 2021-08-06 DIAGNOSIS — E785 Hyperlipidemia, unspecified: Secondary | ICD-10-CM | POA: Diagnosis present

## 2021-08-06 DIAGNOSIS — E860 Dehydration: Secondary | ICD-10-CM | POA: Diagnosis present

## 2021-08-06 DIAGNOSIS — Z720 Tobacco use: Secondary | ICD-10-CM | POA: Diagnosis present

## 2021-08-06 DIAGNOSIS — R0600 Dyspnea, unspecified: Secondary | ICD-10-CM

## 2021-08-06 DIAGNOSIS — R6521 Severe sepsis with septic shock: Secondary | ICD-10-CM | POA: Diagnosis not present

## 2021-08-06 DIAGNOSIS — F17210 Nicotine dependence, cigarettes, uncomplicated: Secondary | ICD-10-CM | POA: Diagnosis present

## 2021-08-06 DIAGNOSIS — J440 Chronic obstructive pulmonary disease with acute lower respiratory infection: Secondary | ICD-10-CM | POA: Diagnosis present

## 2021-08-06 DIAGNOSIS — J9600 Acute respiratory failure, unspecified whether with hypoxia or hypercapnia: Secondary | ICD-10-CM | POA: Diagnosis not present

## 2021-08-06 DIAGNOSIS — E119 Type 2 diabetes mellitus without complications: Secondary | ICD-10-CM

## 2021-08-06 DIAGNOSIS — I5033 Acute on chronic diastolic (congestive) heart failure: Secondary | ICD-10-CM | POA: Diagnosis not present

## 2021-08-06 DIAGNOSIS — G479 Sleep disorder, unspecified: Secondary | ICD-10-CM | POA: Diagnosis not present

## 2021-08-06 DIAGNOSIS — Z803 Family history of malignant neoplasm of breast: Secondary | ICD-10-CM

## 2021-08-06 DIAGNOSIS — R1310 Dysphagia, unspecified: Secondary | ICD-10-CM

## 2021-08-06 DIAGNOSIS — I110 Hypertensive heart disease with heart failure: Secondary | ICD-10-CM | POA: Diagnosis present

## 2021-08-06 DIAGNOSIS — Z9911 Dependence on respirator [ventilator] status: Secondary | ICD-10-CM | POA: Diagnosis not present

## 2021-08-06 DIAGNOSIS — E875 Hyperkalemia: Secondary | ICD-10-CM | POA: Diagnosis not present

## 2021-08-06 DIAGNOSIS — R0602 Shortness of breath: Secondary | ICD-10-CM | POA: Diagnosis not present

## 2021-08-06 DIAGNOSIS — D6959 Other secondary thrombocytopenia: Secondary | ICD-10-CM | POA: Diagnosis present

## 2021-08-06 DIAGNOSIS — Z452 Encounter for adjustment and management of vascular access device: Secondary | ICD-10-CM

## 2021-08-06 DIAGNOSIS — Z0189 Encounter for other specified special examinations: Secondary | ICD-10-CM

## 2021-08-06 DIAGNOSIS — N179 Acute kidney failure, unspecified: Secondary | ICD-10-CM | POA: Diagnosis present

## 2021-08-06 DIAGNOSIS — Z820 Family history of epilepsy and other diseases of the nervous system: Secondary | ICD-10-CM

## 2021-08-06 DIAGNOSIS — J09X1 Influenza due to identified novel influenza A virus with pneumonia: Secondary | ICD-10-CM | POA: Diagnosis not present

## 2021-08-06 DIAGNOSIS — J449 Chronic obstructive pulmonary disease, unspecified: Secondary | ICD-10-CM | POA: Diagnosis present

## 2021-08-06 DIAGNOSIS — I5032 Chronic diastolic (congestive) heart failure: Secondary | ICD-10-CM | POA: Diagnosis present

## 2021-08-06 DIAGNOSIS — D6489 Other specified anemias: Secondary | ICD-10-CM | POA: Diagnosis not present

## 2021-08-06 DIAGNOSIS — Z20822 Contact with and (suspected) exposure to covid-19: Secondary | ICD-10-CM | POA: Diagnosis not present

## 2021-08-06 DIAGNOSIS — Z4659 Encounter for fitting and adjustment of other gastrointestinal appliance and device: Secondary | ICD-10-CM

## 2021-08-06 DIAGNOSIS — E876 Hypokalemia: Secondary | ICD-10-CM | POA: Diagnosis not present

## 2021-08-06 DIAGNOSIS — Z6831 Body mass index (BMI) 31.0-31.9, adult: Secondary | ICD-10-CM | POA: Diagnosis not present

## 2021-08-06 DIAGNOSIS — A419 Sepsis, unspecified organism: Secondary | ICD-10-CM | POA: Diagnosis not present

## 2021-08-06 DIAGNOSIS — R109 Unspecified abdominal pain: Secondary | ICD-10-CM

## 2021-08-06 DIAGNOSIS — I4891 Unspecified atrial fibrillation: Secondary | ICD-10-CM | POA: Diagnosis not present

## 2021-08-06 DIAGNOSIS — Z85828 Personal history of other malignant neoplasm of skin: Secondary | ICD-10-CM

## 2021-08-06 DIAGNOSIS — G2581 Restless legs syndrome: Secondary | ICD-10-CM | POA: Diagnosis present

## 2021-08-06 DIAGNOSIS — Z978 Presence of other specified devices: Secondary | ICD-10-CM | POA: Diagnosis not present

## 2021-08-06 DIAGNOSIS — J9691 Respiratory failure, unspecified with hypoxia: Secondary | ICD-10-CM | POA: Diagnosis not present

## 2021-08-06 DIAGNOSIS — Z23 Encounter for immunization: Secondary | ICD-10-CM

## 2021-08-06 DIAGNOSIS — F17200 Nicotine dependence, unspecified, uncomplicated: Secondary | ICD-10-CM | POA: Diagnosis present

## 2021-08-06 DIAGNOSIS — J101 Influenza due to other identified influenza virus with other respiratory manifestations: Secondary | ICD-10-CM | POA: Diagnosis not present

## 2021-08-06 DIAGNOSIS — I482 Chronic atrial fibrillation, unspecified: Secondary | ICD-10-CM | POA: Diagnosis present

## 2021-08-06 DIAGNOSIS — J111 Influenza due to unidentified influenza virus with other respiratory manifestations: Secondary | ICD-10-CM | POA: Diagnosis present

## 2021-08-06 DIAGNOSIS — E669 Obesity, unspecified: Secondary | ICD-10-CM | POA: Diagnosis present

## 2021-08-06 DIAGNOSIS — I4819 Other persistent atrial fibrillation: Secondary | ICD-10-CM | POA: Diagnosis not present

## 2021-08-06 DIAGNOSIS — E870 Hyperosmolality and hypernatremia: Secondary | ICD-10-CM | POA: Diagnosis present

## 2021-08-06 DIAGNOSIS — I25119 Atherosclerotic heart disease of native coronary artery with unspecified angina pectoris: Secondary | ICD-10-CM | POA: Diagnosis not present

## 2021-08-06 DIAGNOSIS — E1165 Type 2 diabetes mellitus with hyperglycemia: Secondary | ICD-10-CM | POA: Diagnosis present

## 2021-08-06 DIAGNOSIS — E1122 Type 2 diabetes mellitus with diabetic chronic kidney disease: Secondary | ICD-10-CM | POA: Diagnosis present

## 2021-08-06 DIAGNOSIS — R1313 Dysphagia, pharyngeal phase: Secondary | ICD-10-CM | POA: Diagnosis present

## 2021-08-06 DIAGNOSIS — J1000 Influenza due to other identified influenza virus with unspecified type of pneumonia: Secondary | ICD-10-CM | POA: Diagnosis not present

## 2021-08-06 DIAGNOSIS — R5381 Other malaise: Secondary | ICD-10-CM | POA: Diagnosis not present

## 2021-08-06 DIAGNOSIS — E872 Acidosis, unspecified: Secondary | ICD-10-CM | POA: Diagnosis present

## 2021-08-06 DIAGNOSIS — J1008 Influenza due to other identified influenza virus with other specified pneumonia: Secondary | ICD-10-CM | POA: Diagnosis present

## 2021-08-06 DIAGNOSIS — J9601 Acute respiratory failure with hypoxia: Secondary | ICD-10-CM | POA: Diagnosis present

## 2021-08-06 DIAGNOSIS — Z833 Family history of diabetes mellitus: Secondary | ICD-10-CM

## 2021-08-06 DIAGNOSIS — Z955 Presence of coronary angioplasty implant and graft: Secondary | ICD-10-CM

## 2021-08-06 DIAGNOSIS — R0902 Hypoxemia: Secondary | ICD-10-CM

## 2021-08-06 DIAGNOSIS — Z8711 Personal history of peptic ulcer disease: Secondary | ICD-10-CM

## 2021-08-06 DIAGNOSIS — G4733 Obstructive sleep apnea (adult) (pediatric): Secondary | ICD-10-CM | POA: Diagnosis not present

## 2021-08-06 DIAGNOSIS — Z808 Family history of malignant neoplasm of other organs or systems: Secondary | ICD-10-CM

## 2021-08-06 HISTORY — DX: Type 2 diabetes mellitus without complications: E11.9

## 2021-08-06 HISTORY — DX: Atherosclerotic heart disease of native coronary artery without angina pectoris: I25.10

## 2021-08-06 HISTORY — DX: Permanent atrial fibrillation: I48.21

## 2021-08-06 HISTORY — DX: Unspecified malignant neoplasm of skin, unspecified: C44.90

## 2021-08-06 HISTORY — DX: Unspecified diastolic (congestive) heart failure: I50.30

## 2021-08-06 LAB — CBC
HCT: 44.7 % (ref 39.0–52.0)
Hemoglobin: 15 g/dL (ref 13.0–17.0)
MCH: 29 pg (ref 26.0–34.0)
MCHC: 33.6 g/dL (ref 30.0–36.0)
MCV: 86.5 fL (ref 80.0–100.0)
Platelets: 141 10*3/uL — ABNORMAL LOW (ref 150–400)
RBC: 5.17 MIL/uL (ref 4.22–5.81)
RDW: 13.3 % (ref 11.5–15.5)
WBC: 10.3 10*3/uL (ref 4.0–10.5)
nRBC: 0 % (ref 0.0–0.2)

## 2021-08-06 LAB — BLOOD GAS, VENOUS
Acid-Base Excess: 1.2 mmol/L (ref 0.0–2.0)
Bicarbonate: 23.1 mmol/L (ref 20.0–28.0)
Drawn by: 5212
FIO2: 28
O2 Saturation: 24.9 %
Patient temperature: 37.6
pCO2, Ven: 48.5 mmHg (ref 44.0–60.0)
pH, Ven: 7.353 (ref 7.250–7.430)
pO2, Ven: <31 mmHg — CL (ref 32.0–45.0)

## 2021-08-06 LAB — BASIC METABOLIC PANEL
Anion gap: 9 (ref 5–15)
BUN: 24 mg/dL — ABNORMAL HIGH (ref 8–23)
CO2: 21 mmol/L — ABNORMAL LOW (ref 22–32)
Calcium: 8.1 mg/dL — ABNORMAL LOW (ref 8.9–10.3)
Chloride: 102 mmol/L (ref 98–111)
Creatinine, Ser: 1.43 mg/dL — ABNORMAL HIGH (ref 0.61–1.24)
GFR, Estimated: 54 mL/min — ABNORMAL LOW (ref >60)
Glucose, Bld: 158 mg/dL — ABNORMAL HIGH (ref 70–99)
Potassium: 3.9 mmol/L (ref 3.5–5.1)
Sodium: 132 mmol/L — ABNORMAL LOW (ref 135–145)

## 2021-08-06 LAB — HEPATIC FUNCTION PANEL
ALT: 53 U/L — ABNORMAL HIGH (ref 0–44)
AST: 36 U/L (ref 15–41)
Albumin: 3.5 g/dL (ref 3.5–5.0)
Alkaline Phosphatase: 96 U/L (ref 38–126)
Bilirubin, Direct: 0.3 mg/dL — ABNORMAL HIGH (ref 0.0–0.2)
Indirect Bilirubin: 0.4 mg/dL (ref 0.3–0.9)
Total Bilirubin: 0.7 mg/dL (ref 0.3–1.2)
Total Protein: 6.8 g/dL (ref 6.5–8.1)

## 2021-08-06 LAB — RESP PANEL BY RT-PCR (FLU A&B, COVID) ARPGX2
Influenza A by PCR: POSITIVE — AB
Influenza B by PCR: NEGATIVE
SARS Coronavirus 2 by RT PCR: NEGATIVE

## 2021-08-06 LAB — TROPONIN I (HIGH SENSITIVITY): Troponin I (High Sensitivity): 4 ng/L (ref <18)

## 2021-08-06 LAB — PROTIME-INR
INR: 1 (ref 0.8–1.2)
Prothrombin Time: 13.6 seconds (ref 11.4–15.2)

## 2021-08-06 LAB — BRAIN NATRIURETIC PEPTIDE: B Natriuretic Peptide: 75 pg/mL (ref 0.0–100.0)

## 2021-08-06 MED ORDER — METHYLPREDNISOLONE SODIUM SUCC 125 MG IJ SOLR
125.0000 mg | Freq: Once | INTRAMUSCULAR | Status: AC
  Administered 2021-08-06: 125 mg via INTRAVENOUS
  Filled 2021-08-06: qty 2

## 2021-08-06 MED ORDER — METOPROLOL TARTRATE 5 MG/5ML IV SOLN
5.0000 mg | Freq: Once | INTRAVENOUS | Status: AC
  Administered 2021-08-06: 5 mg via INTRAVENOUS
  Filled 2021-08-06: qty 5

## 2021-08-06 NOTE — ED Notes (Signed)
EKG repeated after metoprolol admin and given to EDP

## 2021-08-06 NOTE — ED Triage Notes (Signed)
SOB x 1 week with flu. Normally RA at home, presents to ER at 84%, provided 6L [place] improved to 93%. H/o CHF, afib (eliquis). Notes increased fluid this week, takes daily lasix40mg 

## 2021-08-06 NOTE — ED Provider Notes (Signed)
[facility] EMERGENCY DEPARTMENT Provider Note   CSN: [id number] Arrival date & time: 08/06/21  1845     History Chief Complaint  Patient presents with   Shortness of Breath    Shaun [name] is a 65 y.o. male.  Patient complains of cough and shortness of breath.  Patient has a history of COPD and congestive heart failure.  Patient had an O2 sat of 84% on room air.  The history is provided by the patient and medical records. No language interpreter was used.  Shortness of Breath Severity:  Moderate Onset quality:  Sudden Timing:  Constant Progression:  Worsening Chronicity:  New Context: not activity   Relieved by:  Nothing Associated symptoms: no abdominal pain, no chest pain, no cough, no headaches and no rash       Past Medical History:  Diagnosis Date   Anemia    Anxiety    Arteriosclerotic cardiovascular disease (ASCVD)    Nonobstructive; cath in 2000: 30-40% mid LAD and proximal RCA;normal EF. stress nuclear in 2006-subtle inferoseptal hypoperfusion with reversibility; negative stress EKG; good exercise tolerance   Asthma    Cancer (HCC)    skin cancer   Cataract    CHF (congestive heart failure) (HCC)    Colon polyps    30 colon polyps found on first colonoscopy   COPD (chronic obstructive pulmonary disease) (HCC)    Diabetes mellitus without complication (HCC)    Diverticulitis    DJD (degenerative joint disease)    GERD (gastroesophageal reflux disease)    History of kidney stones    Hyperlipidemia    Hypertension    Insomnia    Obstructive sleep apnea 12/2009   01/26/2010 AHI 83/hr   Paroxysmal atrial fibrillation ([place]) 10/2004   Onset in 10/2004; recurred 09/2008   PUD (peptic ulcer disease)    1980s   RLS (restless legs syndrome)    Sinusitis     Patient Active Problem List   Diagnosis Date Noted   Acute respiratory failure with hypoxia ([place]) 08/06/2021   Ventral hernia 12/31/2020   Nausea with vomiting 12/31/2019   Dyslipidemia, goal LDL  below 70 06/18/2019   Hx of adenomatous colonic polyps 10/17/2018   Anemia 10/17/2018   Phimosis of penis 09/27/2018   Osteoarthritis of right hip 06/20/2018   Esophageal dysphagia 05/21/2018   GERD (gastroesophageal reflux disease) 05/21/2018   RLS (restless legs syndrome)    PUD (peptic ulcer disease)    Insomnia    Chronic diastolic CHF (congestive heart failure) (HCC)    Asthma    Arteriosclerotic cardiovascular disease (ASCVD)    Chronic anticoagulation 01/27/2017   Depression 09/24/2015   COPD (chronic obstructive pulmonary disease) ([place]) 07/20/2015   Accelerating angina ([place]) 07/19/2015   Genetic testing 09/09/2014   H/O adenomatous polyp of colon 08/11/2014   Non-insulin treated type 2 diabetes mellitus ([place]) 02/09/2011   CAD S/P percutaneous coronary angioplasty    Tobacco abuse    Essential hypertension    Chronic atrial fibrillation ([place]) 02/23/2010   Obstructive sleep apnea 12/27/2009    Past Surgical History:  Procedure Laterality Date   BIOPSY  07/17/2018   Procedure: BIOPSY;  Surgeon: Danie Binder, MD;  [facility];  Service: Endoscopy;;  colon   BOWEL RESECTION  09/17/2018   SMALL BOWEL RESECTION: 71 CM    CARDIAC CATHETERIZATION N/A 07/21/2015   Procedure: Left Heart Cath and Coronary Angiography;  Surgeon: Peter M Martinique, MD;  Location: [place] [facility];  Service: Cardiovascular;  Laterality: N/A;   CARDIAC CATHETERIZATION N/A 07/21/2015   Procedure: Coronary Stent Intervention;  Surgeon: Peter M Martinique, MD;  Location: [place] [facility];  Service: Cardiovascular;  Laterality: N/A;   CIRCUMCISION N/A 04/05/2019   Procedure: CIRCUMCISION ADULT;  Surgeon: Irine Seal, MD;  Location: [facility];  Service: Urology;  Laterality: N/A;   COLONOSCOPY N/A 05/19/2014   Dr. Barnie Alderman diverticulosis/moderate external hemorrhoids, >20 simple adenomas. Genetic screening negative.    COLONOSCOPY WITH PROPOFOL N/A 07/17/2018   Dr. Oneida Alar: Diverticulosis,  external/internal hemorrhoids, 32 colon polyps removed.  ten tubular adenomas removed with no high-grade dysplasia.  Advised to have surveillance colonoscopy in 3 years.   COLONOSCOPY WITH PROPOFOL N/A 06/07/2021   Procedure: COLONOSCOPY WITH PROPOFOL;  Surgeon: Eloise Harman, DO;  [facility];  Service: Endoscopy;  Laterality: N/A;  9:30 / ASA 3  (Pt was told that his time will be given at Pre-op)   ESOPHAGOGASTRODUODENOSCOPY (EGD) WITH PROPOFOL N/A 07/17/2018   Dr. Oneida Alar: Low-grade narrowing Schatzki ring at the GE junction status post dilation.  Gastritis.  Biopsy with mild nonspecific reactive gastropathy.  No H. pylori.   GIVENS CAPSULE STUDY N/A 06/24/2019   normal   HERNIA REPAIR  1986   Left inguinal   INTRAVASCULAR PRESSURE WIRE/FFR STUDY Left 06/08/2017   Procedure: INTRAVASCULAR PRESSURE WIRE/FFR STUDY;  Surgeon: Nelva Bush, MD;  Location: [place] [facility];  Service: Cardiovascular;  Laterality: Left;  LAD and CFX   LAPAROTOMY N/A 09/17/2018   Procedure: EXPLORATORY LAPAROTOMY;  Surgeon: Virl Cagey, MD;  Location: [facility];  Service: General;  Laterality: N/A;   LEFT HEART CATH AND CORONARY ANGIOGRAPHY N/A 06/08/2017   Procedure: LEFT HEART CATH AND CORONARY ANGIOGRAPHY;  Surgeon: Nelva Bush, MD;  Location: [place] [facility];  Service: Cardiovascular;  Laterality: N/A;   POLYPECTOMY  07/17/2018   Procedure: POLYPECTOMY;  Surgeon: Danie Binder, MD;  [facility];  Service: Endoscopy;;  colon   POLYPECTOMY  06/07/2021   Procedure: POLYPECTOMY INTESTINAL;  Surgeon: Eloise Harman, DO;  [facility];  Service: Endoscopy;;   ROTATOR CUFF REPAIR     Right   SAVORY DILATION N/A 07/17/2018   Procedure: SAVORY DILATION;  Surgeon: Danie Binder, MD;  [facility];  Service: Endoscopy;  Laterality: N/A;       Family History  Problem Relation Age of Onset   Hypertension Mother    Breast cancer Mother 49        brain/bone    Heart attack Father    Skin cancer Sister 3   Diabetes Brother    Parkinson's disease Brother    Brain cancer Maternal Uncle    Cancer Maternal Uncle        NOS   Breast cancer Cousin        maternal cousin dx <50   Cancer Cousin    Colon cancer Neg Hx     Social History   Tobacco Use   Smoking status: Every Day    Packs/day: 0.50    Years: 39.00    Pack years: 19.50    Types: Cigarettes    Start date: 07/24/1970   Smokeless tobacco: Never  Vaping Use   Vaping Use: Never used  Substance Use Topics   Alcohol use: No    Alcohol/week: 0.0 standard drinks   Drug use: No    Home Medications Prior to Admission medications   Medication Sig Start  Date End Date Taking? Authorizing Provider  albuterol (VENTOLIN HFA) 108 (90 Base) MCG/ACT inhaler INHALE 2 PUFFS INTO THE LUNGS EVERY 6 HOURS AS NEEDED FOR WHEEZING OR SHORTNESS OF BREATH 08/01/21  Yes Hawks, Christy A, FNP  apixaban (ELIQUIS) 5 MG TABS tablet TAKE 1 TABLET(5 MG) BY MOUTH TWICE DAILY 05/05/21  Yes Lelon Perla, MD  ARIPiprazole (ABILIFY) 2 MG tablet Take 1 tablet (2 mg total) by mouth daily. 05/11/21  Yes Maximiano Coss, NP  atorvastatin (LIPITOR) 80 MG tablet Take 1 tablet (80 mg total) by mouth daily. TAKE 1 TABLET(80 MG) BY MOUTH EVERY EVENING 05/04/21  Yes Loel Dubonnet, NP  benzonatate (TESSALON PERLES) 100 MG capsule Take 1 capsule (100 mg total) by mouth 3 (three) times daily as needed. 08/01/21  Yes Hawks, Christy A, FNP  buPROPion (WELLBUTRIN XL) 150 MG 24 hr tablet Take 3 tablets once daily Patient taking differently: Take 150 mg by mouth 3 (three) times daily with meals. 02/10/21  Yes Maximiano Coss, NP  docusate sodium (COLACE) 100 MG capsule Take 100 mg by mouth daily as needed for mild constipation.   Yes [provider]  escitalopram (LEXAPRO) 20 MG tablet TAKE 1 TABLET(20 MG) BY MOUTH DAILY 03/13/20  Yes Lovena Le, Malena M, DO  ezetimibe (ZETIA) 10 MG tablet TAKE 1 TABLET BY MOUTH  EVERY DAY 07/21/21 07/21/22 Yes Lelon Perla, MD  ferrous sulfate 325 (65 FE) MG tablet Take 325 mg by mouth daily with breakfast.   Yes [provider]  furosemide (LASIX) 40 MG tablet Take 1 tablet (40 mg total) by mouth daily. 05/04/21 04/29/22 Yes Loel Dubonnet, NP  lisinopril (ZESTRIL) 2.5 MG tablet TAKE 1 TABLET(2.5 MG) BY MOUTH DAILY. 02/04/21 02/04/22 Yes Crenshaw, Denice Bors, MD  meclizine (ANTIVERT) 25 MG tablet Take 1 tablet (25 mg total) by mouth 3 (three) times daily as needed for dizziness. 10/18/16  Yes Daleen Bo, MD  metFORMIN (GLUCOPHAGE) 500 MG tablet TAKE ONE TABLET (500 MG) BY MOUTH TWICE DAILY WITH A MEAL. 06/03/21 06/03/22 Yes Maximiano Coss, NP  metoprolol tartrate (LOPRESSOR) 100 MG tablet Take 1 tablet (100 mg total) by mouth 2 (two) times daily. 07/29/21  Yes Maximiano Coss, NP  nitroGLYCERIN (NITROSTAT) 0.4 MG SL tablet Place 1 tablet (0.4 mg total) under the tongue every 5 (five) minutes as needed. 06/30/15  Yes Lendon Colonel, NP  omeprazole (PRILOSEC) 40 MG capsule Take 1 capsule by mouth once daily 30 minutes before breakfast 12/31/20  Yes Annitta Needs, NP  ondansetron (ZOFRAN) 4 MG tablet Take 1 tablet (4 mg total) by mouth every 8 (eight) hours as needed for nausea or vomiting. 12/31/19  Yes Annitta Needs, NP  potassium chloride SA (KLOR-CON) 20 MEQ tablet Take 1 tablet (20 mEq total) by mouth daily. 04/01/21  Yes Lelon Perla, MD  rOPINIRole (REQUIP) 3 MG tablet TAKE 1 TABLET BY MOUTH EVERY NIGHT AT BEDTIME 03/17/21 03/17/22 Yes Maximiano Coss, NP  sildenafil (VIAGRA) 100 MG tablet TAKE 1 TABLET BY MOUTH 30 MINUTES BEFORE ACTIVITY 07/30/21  Yes Lelon Perla, MD  sucralfate (CARAFATE) 1 g tablet Take one tablet po BID PRN Patient taking differently: Take 1 g by mouth 2 (two) times daily as needed (acid reflux). 01/08/20  Yes Mikey Kirschner, MD  traMADol (ULTRAM) 50 MG tablet Take 1 tablet (50 mg total) by mouth twice daily as needed for moderate pain  05/20/21  Yes Kirsteins, Luanna Salk, MD  traZODone (DESYREL) 50 MG  tablet TAKE 2 TABLETS (100 MG) BY MOUTH AT BEDTIME 02/09/21 02/09/22 Yes Maximiano Coss, NP  pseudoephedrine (SUDAFED) 30 MG tablet Take 30 mg by mouth every 6 (six) hours as needed for congestion. Patient not taking: Reported on 08/06/2021    [provider]    Allergies    Patient has no known allergies.  Review of Systems   Review of Systems  Constitutional:  Negative for appetite change and fatigue.  HENT:  Negative for congestion, ear discharge and sinus pressure.   Eyes:  Negative for discharge.  Respiratory:  Positive for shortness of breath. Negative for cough.   Cardiovascular:  Negative for chest pain.  Gastrointestinal:  Negative for abdominal pain and diarrhea.  Genitourinary:  Negative for frequency and hematuria.  Musculoskeletal:  Negative for back pain.  Skin:  Negative for rash.  Neurological:  Negative for seizures and headaches.  Psychiatric/Behavioral:  Negative for hallucinations.    Physical Exam Updated Vital Signs BP 125/88   Pulse 99   Temp 99.7 F (37.6 C) (Oral)   Resp (!) 26   Wt 95.3 kg   SpO2 95%   BMI 31.93 kg/m   Physical Exam Vitals and nursing note reviewed.  Constitutional:      Appearance: He is well-developed.  HENT:     Head: Normocephalic.     Nose: Nose normal.  Eyes:     General: No scleral icterus.    Conjunctiva/sclera: Conjunctivae normal.  Neck:     Thyroid: No thyromegaly.  Cardiovascular:     Rate and Rhythm: Normal rate and regular rhythm.     Heart sounds: No murmur heard.   No friction rub. No gallop.  Pulmonary:     Breath sounds: No stridor. Wheezing present. No rales.  Chest:     Chest wall: No tenderness.  Abdominal:     General: There is no distension.     Tenderness: There is no abdominal tenderness. There is no rebound.  Musculoskeletal:        General: Normal range of motion.     Cervical back: Neck supple.  Lymphadenopathy:      Cervical: No cervical adenopathy.  Skin:    Findings: No erythema or rash.  Neurological:     Mental Status: He is alert and oriented to person, place, and time.     Motor: No abnormal muscle tone.     Coordination: Coordination normal.  Psychiatric:        Behavior: Behavior normal.    ED Results / Procedures / Treatments   Labs (all labs ordered are listed, but only abnormal results are displayed) Labs Reviewed  RESP PANEL BY RT-PCR (FLU A&B, COVID) ARPGX2 - Abnormal; Notable for the following components:      Result Value   Influenza A by PCR POSITIVE (*)    All other components within normal limits  BASIC METABOLIC PANEL - Abnormal; Notable for the following components:   Sodium 132 (*)    CO2 21 (*)    Glucose, Bld 158 (*)    BUN 24 (*)    Creatinine, Ser 1.43 (*)    Calcium 8.1 (*)    GFR, Estimated 54 (*)    All other components within normal limits  CBC - Abnormal; Notable for the following components:   Platelets 141 (*)    All other components within normal limits  HEPATIC FUNCTION PANEL - Abnormal; Notable for the following components:   ALT 53 (*)    Bilirubin, Direct  0.3 (*)    All other components within normal limits  PROTIME-INR  BRAIN NATRIURETIC PEPTIDE    EKG None  Radiology DG Chest Port 1 View  Result Date: 08/06/2021 CLINICAL DATA:  Shortness of breath EXAM: PORTABLE CHEST 1 VIEW COMPARISON:  09/21/2018 FINDINGS: Cardiomegaly. No confluent opacities, effusions or edema. No acute bony abnormality. IMPRESSION: Cardiomegaly.  No active disease. Electronically Signed   By: Rolm Baptise M.D.   On: 08/06/2021 19:45    Procedures Procedures   Medications Ordered in ED Medications  metoprolol tartrate (LOPRESSOR) injection 5 mg (5 mg Intravenous Given 08/06/21 2002)  methylPREDNISolone sodium succinate (SOLU-MEDROL) 125 mg/2 mL injection 125 mg (125 mg Intravenous Given 08/06/21 2109)    ED Course  I have reviewed the triage vital signs and the  nursing notes.  Pertinent labs & imaging results that were available during my care of the patient were reviewed by me and considered in my medical decision making (see chart for details).    MDM Rules/Calculators/A&P                           Patient with hypoxia secondary to influenza with exacerbation of COPD.  He will be admitted to medicine Final Clinical Impression(s) / ED Diagnoses Final diagnoses:  Influenza    Rx / DC Orders ED Discharge Orders     None        Milton Ferguson, MD 08/07/21 1334

## 2021-08-07 ENCOUNTER — Encounter (HOSPITAL_COMMUNITY): Payer: Self-pay | Admitting: Family Medicine

## 2021-08-07 ENCOUNTER — Inpatient Hospital Stay (HOSPITAL_COMMUNITY): Payer: 59

## 2021-08-07 DIAGNOSIS — J9601 Acute respiratory failure with hypoxia: Secondary | ICD-10-CM | POA: Diagnosis not present

## 2021-08-07 LAB — COMPREHENSIVE METABOLIC PANEL
ALT: 50 U/L — ABNORMAL HIGH (ref 0–44)
AST: 24 U/L (ref 15–41)
Albumin: 3.4 g/dL — ABNORMAL LOW (ref 3.5–5.0)
Alkaline Phosphatase: 96 U/L (ref 38–126)
Anion gap: 6 (ref 5–15)
BUN: 24 mg/dL — ABNORMAL HIGH (ref 8–23)
CO2: 26 mmol/L (ref 22–32)
Calcium: 8.6 mg/dL — ABNORMAL LOW (ref 8.9–10.3)
Chloride: 102 mmol/L (ref 98–111)
Creatinine, Ser: 1.08 mg/dL (ref 0.61–1.24)
GFR, Estimated: >60 mL/min (ref >60)
Glucose, Bld: 194 mg/dL — ABNORMAL HIGH (ref 70–99)
Potassium: 5 mmol/L (ref 3.5–5.1)
Sodium: 134 mmol/L — ABNORMAL LOW (ref 135–145)
Total Bilirubin: 1 mg/dL (ref 0.3–1.2)
Total Protein: 6.9 g/dL (ref 6.5–8.1)

## 2021-08-07 LAB — HIV ANTIBODY (ROUTINE TESTING W REFLEX): HIV Screen 4th Generation wRfx: NONREACTIVE

## 2021-08-07 LAB — HEMOGLOBIN A1C
Hgb A1c MFr Bld: 6.9 % — ABNORMAL HIGH (ref 4.8–5.6)
Mean Plasma Glucose: 151.33 mg/dL

## 2021-08-07 LAB — TROPONIN I (HIGH SENSITIVITY): Troponin I (High Sensitivity): 4 ng/L (ref <18)

## 2021-08-07 LAB — GLUCOSE, CAPILLARY
Glucose-Capillary: 147 mg/dL — ABNORMAL HIGH (ref 70–99)
Glucose-Capillary: 190 mg/dL — ABNORMAL HIGH (ref 70–99)
Glucose-Capillary: 118 mg/dL — ABNORMAL HIGH (ref 70–99)

## 2021-08-07 LAB — MAGNESIUM: Magnesium: 2.3 mg/dL (ref 1.7–2.4)

## 2021-08-07 MED ORDER — ACETAMINOPHEN 650 MG RE SUPP: 650.0000 mg | Freq: Four times a day (QID) | RECTAL | Status: DC | PRN

## 2021-08-07 MED ORDER — INFLUENZA VAC A&B SA ADJ QUAD 0.5 ML IM PRSY
0.5000 mL | PREFILLED_SYRINGE | INTRAMUSCULAR | Status: AC
  Administered 2021-08-08: 0.5 mL via INTRAMUSCULAR
  Filled 2021-08-07: qty 0.5

## 2021-08-07 MED ORDER — ONDANSETRON HCL 4 MG PO TABS: 4.0000 mg | ORAL_TABLET | Freq: Four times a day (QID) | ORAL | Status: DC | PRN

## 2021-08-07 MED ORDER — METHYLPREDNISOLONE SODIUM SUCC 40 MG IJ SOLR
40.0000 mg | Freq: Two times a day (BID) | INTRAMUSCULAR | Status: DC
Start: 2021-08-07 — End: 2021-08-09
  Administered 2021-08-07 – 2021-08-09 (×5): 40 mg via INTRAVENOUS
  Filled 2021-08-07 (×6): qty 1

## 2021-08-07 MED ORDER — MECLIZINE HCL 12.5 MG PO TABS: 25.0000 mg | ORAL_TABLET | Freq: Three times a day (TID) | ORAL | Status: DC | PRN

## 2021-08-07 MED ORDER — LEVALBUTEROL HCL 1.25 MG/0.5ML IN NEBU
1.2500 mg | INHALATION_SOLUTION | Freq: Four times a day (QID) | RESPIRATORY_TRACT | Status: DC
  Administered 2021-08-07 – 2021-08-08 (×7): 1.25 mg via RESPIRATORY_TRACT
  Filled 2021-08-07 (×7): qty 0.5

## 2021-08-07 MED ORDER — ALBUTEROL SULFATE (2.5 MG/3ML) 0.083% IN NEBU: 2.5000 mg | INHALATION_SOLUTION | RESPIRATORY_TRACT | Status: DC | PRN

## 2021-08-07 MED ORDER — ONDANSETRON HCL 4 MG/2ML IJ SOLN: 4.0000 mg | Freq: Four times a day (QID) | INTRAMUSCULAR | Status: DC | PRN

## 2021-08-07 MED ORDER — ARIPIPRAZOLE 2 MG PO TABS
2.0000 mg | ORAL_TABLET | Freq: Every day | ORAL | Status: DC
  Administered 2021-08-07 – 2021-08-09 (×3): 2 mg via ORAL
  Filled 2021-08-07 (×5): qty 1

## 2021-08-07 MED ORDER — INSULIN ASPART 100 UNIT/ML IJ SOLN: 0.0000 [IU] | Freq: Every day | INTRAMUSCULAR | Status: DC

## 2021-08-07 MED ORDER — EZETIMIBE 10 MG PO TABS
10.0000 mg | ORAL_TABLET | Freq: Every day | ORAL | Status: DC
  Administered 2021-08-07 – 2021-08-09 (×3): 10 mg via ORAL
  Filled 2021-08-07 (×4): qty 1

## 2021-08-07 MED ORDER — APIXABAN 5 MG PO TABS
5.0000 mg | ORAL_TABLET | Freq: Two times a day (BID) | ORAL | Status: DC
  Administered 2021-08-07 – 2021-08-09 (×6): 5 mg via ORAL
  Filled 2021-08-07 (×6): qty 1

## 2021-08-07 MED ORDER — BUPROPION HCL ER (XL) 150 MG PO TB24
150.0000 mg | ORAL_TABLET | Freq: Three times a day (TID) | ORAL | Status: DC
  Administered 2021-08-07 – 2021-08-09 (×8): 150 mg via ORAL
  Filled 2021-08-07 (×8): qty 1

## 2021-08-07 MED ORDER — OSELTAMIVIR PHOSPHATE 75 MG PO CAPS
75.0000 mg | ORAL_CAPSULE | Freq: Two times a day (BID) | ORAL | Status: DC
  Administered 2021-08-07 – 2021-08-09 (×5): 75 mg via ORAL
  Filled 2021-08-07 (×6): qty 1

## 2021-08-07 MED ORDER — METOPROLOL TARTRATE 50 MG PO TABS
100.0000 mg | ORAL_TABLET | Freq: Two times a day (BID) | ORAL | Status: DC
  Administered 2021-08-07 – 2021-08-09 (×6): 100 mg via ORAL
  Filled 2021-08-07 (×6): qty 2

## 2021-08-07 MED ORDER — ROPINIROLE HCL 1 MG PO TABS
1.0000 mg | ORAL_TABLET | Freq: Every day | ORAL | Status: DC
  Administered 2021-08-07 – 2021-08-08 (×3): 1 mg via ORAL
  Filled 2021-08-07 (×5): qty 1

## 2021-08-07 MED ORDER — INSULIN ASPART 100 UNIT/ML IJ SOLN
0.0000 [IU] | Freq: Three times a day (TID) | INTRAMUSCULAR | Status: DC
  Administered 2021-08-07 (×2): 2 [IU] via SUBCUTANEOUS
  Administered 2021-08-07 – 2021-08-08 (×2): 3 [IU] via SUBCUTANEOUS
  Administered 2021-08-08: 8 [IU] via SUBCUTANEOUS
  Administered 2021-08-09: 1 [IU] via SUBCUTANEOUS
  Administered 2021-08-09: 2 [IU] via SUBCUTANEOUS

## 2021-08-07 MED ORDER — PNEUMOCOCCAL VAC POLYVALENT 25 MCG/0.5ML IJ INJ
0.5000 mL | INJECTION | INTRAMUSCULAR | Status: AC
  Administered 2021-08-08: 0.5 mL via INTRAMUSCULAR
  Filled 2021-08-07: qty 0.5

## 2021-08-07 MED ORDER — GUAIFENESIN ER 600 MG PO TB12
600.0000 mg | ORAL_TABLET | Freq: Two times a day (BID) | ORAL | Status: DC
  Administered 2021-08-07: 600 mg via ORAL
  Filled 2021-08-07: qty 1

## 2021-08-07 MED ORDER — ACETAMINOPHEN 325 MG PO TABS: 650.0000 mg | ORAL_TABLET | Freq: Four times a day (QID) | ORAL | Status: DC | PRN

## 2021-08-07 MED ORDER — GUAIFENESIN-DM 100-10 MG/5ML PO SYRP
10.0000 mL | ORAL_SOLUTION | Freq: Three times a day (TID) | ORAL | Status: DC
  Administered 2021-08-07 – 2021-08-09 (×7): 10 mL via ORAL
  Filled 2021-08-07 (×7): qty 10

## 2021-08-07 MED ORDER — BENZONATATE 100 MG PO CAPS
100.0000 mg | ORAL_CAPSULE | Freq: Three times a day (TID) | ORAL | Status: DC | PRN
  Administered 2021-08-08 – 2021-08-09 (×3): 100 mg via ORAL
  Filled 2021-08-07 (×4): qty 1

## 2021-08-07 MED ORDER — IPRATROPIUM BROMIDE 0.02 % IN SOLN
0.5000 mg | Freq: Four times a day (QID) | RESPIRATORY_TRACT | Status: DC
Start: 2021-08-07 — End: 2021-08-08
  Administered 2021-08-07 – 2021-08-08 (×7): 0.5 mg via RESPIRATORY_TRACT
  Filled 2021-08-07 (×7): qty 2.5

## 2021-08-07 MED ORDER — ATORVASTATIN CALCIUM 40 MG PO TABS
80.0000 mg | ORAL_TABLET | Freq: Every day | ORAL | Status: DC
  Administered 2021-08-07 – 2021-08-09 (×3): 80 mg via ORAL
  Filled 2021-08-07 (×4): qty 2

## 2021-08-07 MED ORDER — OXYCODONE HCL 5 MG PO TABS
5.0000 mg | ORAL_TABLET | ORAL | Status: DC | PRN
  Administered 2021-08-07 – 2021-08-09 (×4): 5 mg via ORAL
  Filled 2021-08-07 (×4): qty 1

## 2021-08-07 MED ORDER — ESCITALOPRAM OXALATE 10 MG PO TABS
20.0000 mg | ORAL_TABLET | Freq: Every day | ORAL | Status: DC
  Administered 2021-08-07 – 2021-08-09 (×3): 20 mg via ORAL
  Filled 2021-08-07 (×4): qty 2

## 2021-08-07 MED ORDER — TRAZODONE HCL 50 MG PO TABS
100.0000 mg | ORAL_TABLET | Freq: Every day | ORAL | Status: DC
  Administered 2021-08-07 – 2021-08-08 (×3): 100 mg via ORAL
  Filled 2021-08-07 (×3): qty 2

## 2021-08-07 NOTE — Progress Notes (Signed)
PROGRESS NOTE    Patient: Shaun [name]                            PCP: Maximiano Coss, NP                    DOB: 05/18/1956            DOA: 08/06/2021 FAO:[id number]             DOS: 08/07/2021, 9:47 AM   LOS: 1 day   Date of Service: The patient was seen and examined on 08/07/2021  Subjective:   The patient was seen and examined this morning. Stable at this time. Dry cough shortness of breath, on 5 L of oxygen, satting 98% Brief Narrative:   Shaun [name]  is a 65 y.o. male, with history of anxiety, ASCVD, CHF, COPD, diabetes mellitus type 2, hyperlipidemia, hypertension, obstructive sleep apnea, paroxysmal atrial fibrillation, and more presents ED with chief complaint of dyspnea.  Patient reports he has had fever and dyspnea for 1 week.  He reports that he was diagnosed with the flu 6 days ago.  He was given a steroid and Tessalon Perles per his report.  He reports that it was not helpful.  He had a T-max at home of 102.  He tried Tylenol and thinks that he had temporary relief.  He has a cough but is only occasionally productive with green sputum.   He does not wear oxygen at home.  He does sleep with a CPAP at home.  Pa   Patient smokes half a pack a day but declines a nicotine patch as he has not had a cigarette since he has been ill.   Patient is vaccinated for COVID but not flu.  Patient is full code.  ED Temp 99.7, heart rate 101-125, respiratory rate 16-25, blood pressure 125/88, satting 96% on 3 L nasal cannula No leukocytosis with white blood cell count 10.3, hemoglobin 15 Chemistry panel reveals a decreased bicarb at 21 and an AKI with a creatinine of 1.43 Patient is fluid positive Chest x-ray shows no active disease    Assessment & Plan:   Principal Problem:   Acute respiratory failure with hypoxia (HCC) Active Problems:   Chronic atrial fibrillation (HCC)   Tobacco abuse   Obstructive sleep apnea   Essential hypertension   Non-insulin treated type 2  diabetes mellitus (HCC)   COPD (chronic obstructive pulmonary disease) (HCC)   Chronic diastolic CHF (congestive heart failure) (HCC)      Principal Problem:   Acute respiratory failure with hypoxia (HCC)-due to influenza A  -Chest x-ray clear no signs of pneumonia, WBC 10.3 -ABG 7.35/48.5/<31/23.1 -Continue aggressive treatment with IV steroids, DuoNeb bronchodilators, mucolytic, -Although patient has symptoms > 6 days still symptomatic with low-grade fever initiating Tamiflu for 5 days -Symptom may be superimposed by COPD, CHF, OSA,--although no signs of exacerbation (BNP 75, -We will monitor closely -Currently requiring 5 L of oxygen maintaining saturation of 98%  Acute renal insufficiency -Baseline creatinine around 0.89, Cr. On admission>> 1.43 >>> 1.08 -Holding ACE inhibitor's and Lasix -We will try to avoid nephrotoxins -Monitor kidney function closely   Active Problems:  Chronic atrial fibrillation (HCC) -Currently stable, continue home medication Lopressor and Eliquis  Tobacco abuse -advise regarding smoking cessation, declined NicoDerm patch  Obstructive sleep apnea -currently on 5 L oxygen, as needed nightly CPAP    Essential hypertension -stable resuming home medication  Non-insulin treated type 2 diabetes mellitus (HCC) -CBG QA CHS, SSI coverage    COPD (chronic obstructive pulmonary disease) ([place]) -management as above   Chronic diastolic CHF (congestive heart failure) (HCC) -monitoring daily weights, I's and O's, with holding patient on Lasix for now   ------------------------------------------------------------------------------------------------------------------------------------------ Cultures; Blood Cultures x 2 >> NGT   Antimicrobials: Tamiflu   Consultants: None  -------------------------------------------------------------------------------------------------------------------------------------  DVT prophylaxis:  SCD/Compression  stockings and RD:EYCXKGY Code Status:   Code Status: Full Code  Family Communication: Wife present at bedside updated The above findings and plan of care has been discussed with patient (and family)  in detail,  they expressed understanding and agreement of above. -Advance care planning has been discussed.   Admission status:   Status is: Inpatient  Remains inpatient appropriate because: As patient in respiratory failure requiring supplemental oxygen 5 L hypoxic... Needing aggressive treatment for underlying influenza A treatment, supportive therapy      Level of care: Telemetry   Procedures:   No admission procedures for hospital encounter.    Antimicrobials:  Anti-infectives (From admission, onward)    Start     Dose/Rate Route Frequency Ordered Stop   08/07/21 0800  oseltamivir (TAMIFLU) capsule 75 mg        75 mg Oral 2 times daily 08/07/21 0711 08/12/21 0959        Medication:   apixaban  5 mg Oral BID   ARIPiprazole  2 mg Oral Daily   atorvastatin  80 mg Oral Daily   buPROPion  150 mg Oral TID WC   escitalopram  20 mg Oral Daily   ezetimibe  10 mg Oral Daily   guaiFENesin-dextromethorphan  10 mL Oral Q8H   [START ON 08/08/2021] influenza vaccine adjuvanted  0.5 mL Intramuscular Tomorrow-1000   insulin aspart  0-15 Units Subcutaneous TID WC   insulin aspart  0-5 Units Subcutaneous QHS   ipratropium  0.5 mg Nebulization Q6H   levalbuterol  1.25 mg Nebulization Q6H   methylPREDNISolone (SOLU-MEDROL) injection  40 mg Intravenous Q12H   metoprolol tartrate  100 mg Oral BID   oseltamivir  75 mg Oral BID   [START ON 08/08/2021] pneumococcal 23 valent vaccine  0.5 mL Intramuscular Tomorrow-1000   rOPINIRole  1 mg Oral QHS   traZODone  100 mg Oral QHS    acetaminophen **OR** acetaminophen, benzonatate, meclizine, ondansetron **OR** ondansetron (ZOFRAN) IV, oxyCODONE   Objective:   Vitals:   08/07/21 0744 08/07/21 0817 08/07/21 0837 08/07/21 0903  BP:   101/75 122/89   Pulse:  86 92   Resp:  (!) 21 20   Temp:  97.8 F (36.6 C) 97.7 F (36.5 C)   TempSrc:  Oral Oral   SpO2: 95% 95% 98%   Weight:    94.4 kg  Height:    5\' 8"  (1.727 m)    Intake/Output Summary (Last 24 hours) at 08/07/2021 0947 Last data filed at 08/07/2021 0421 Gross per 24 hour  Intake --  Output 600 ml  Net -600 ml   Filed Weights   08/06/21 1904 08/07/21 0903  Weight: 95.3 kg 94.4 kg     Examination:   Physical Exam  Constitution: Shortness of breath, dry cough, alert, cooperative, no distress,  Appears calm and comfortable  Psychiatric: Normal and stable mood and affect, cognition intact,   HEENT: Normocephalic, PERRL, otherwise with in Normal limits  Chest:Chest symmetric Cardio vascular:  S1/S2, RRR, No murmure, No Rubs or Gallops  pulmonary: Diffuse rhonchi, diminished breath sounds  mid to lower lobes respirations unlabored, negative wheezes / crackles Abdomen: Soft, non-tender, non-distended, bowel sounds,no masses, no organomegaly Muscular skeletal: Limited exam - in bed, able to move all 4 extremities, Normal strength,  Neuro: CNII-XII intact. , normal motor and sensation, reflexes intact  Extremities: No pitting edema lower extremities, +2 pulses  Skin: Dry, warm to touch, negative for any Rashes, No open wounds Wounds: per nursing documentation    ------------------------------------------------------------------------------------------------------------------------------------------    LABs:  CBC Latest Ref Rng & Units 08/06/2021 06/02/2021 02/10/2021  WBC 4.0 - 10.5 K/uL 10.3 6.8 6.9  Hemoglobin 13.0 - 17.0 g/dL 15.0 14.4 15.1  Hematocrit 39.0 - 52.0 % 44.7 42.6 44.2  Platelets 150 - 400 K/uL 141(L) 166 169.0   CMP Latest Ref Rng & Units 08/07/2021 08/06/2021 06/28/2021  Glucose 70 - 99 mg/dL 194(H) 158(H) 126(H)  BUN 8 - 23 mg/dL 24(H) 24(H) 15  Creatinine 0.61 - 1.24 mg/dL 1.08 1.43(H) 0.89  Sodium 135 - 145 mmol/L 134(L) 132(L) 144   Potassium 3.5 - 5.1 mmol/L 5.0 3.9 4.2  Chloride 98 - 111 mmol/L 102 102 106  CO2 22 - 32 mmol/L 26 21(L) 24  Calcium 8.9 - 10.3 mg/dL 8.6(L) 8.1(L) 8.6  Total Protein 6.5 - 8.1 g/dL 6.9 6.8 6.0  Total Bilirubin 0.3 - 1.2 mg/dL 1.0 0.7 0.5  Alkaline Phos 38 - 126 U/L 96 96 111  AST 15 - 41 U/L 24 36 20  ALT 0 - 44 U/L 50(H) 53(H) 27       Micro Results Recent Results (from the past 240 hour(s))  Resp Panel by RT-PCR (Flu A&B, Covid) Nasopharyngeal Swab     Status: Abnormal   Collection Time: 08/06/21  7:28 PM   Specimen: Nasopharyngeal Swab; Nasopharyngeal(NP) swabs in vial transport medium  Result Value Ref Range Status   SARS Coronavirus 2 by RT PCR NEGATIVE NEGATIVE Final    Comment: (NOTE) SARS-CoV-2 target nucleic acids are NOT DETECTED.  The SARS-CoV-2 RNA is generally detectable in upper respiratory specimens during the acute phase of infection. The lowest concentration of SARS-CoV-2 viral copies this assay can detect is 138 copies/mL. A negative result does not preclude SARS-Cov-2 infection and should not be used as the sole basis for treatment or other patient management decisions. A negative result may occur with  improper specimen collection/handling, submission of specimen other than nasopharyngeal swab, presence of viral mutation(s) within the areas targeted by this assay, and inadequate number of viral copies(<138 copies/mL). A negative result must be combined with clinical observations, patient history, and epidemiological information. The expected result is Negative.  Fact Sheet for Patients:  EntrepreneurPulse.com.au  Fact Sheet for Healthcare Providers:  IncredibleEmployment.be  This test is no t yet approved or cleared by the [place] FDA and  has been authorized for detection and/or diagnosis of SARS-CoV-2 by FDA under an Emergency Use Authorization (EUA). This EUA will remain  in effect (meaning this test can  be used) for the duration of the COVID-19 declaration under Section 564(b)(1) of the Act, 21 U.S.C.section 360bbb-3(b)(1), unless the authorization is terminated  or revoked sooner.       Influenza A by PCR POSITIVE (A) NEGATIVE Final   Influenza B by PCR NEGATIVE NEGATIVE Final    Comment: (NOTE) The Xpert Xpress SARS-CoV-2/FLU/RSV plus assay is intended as an aid in the diagnosis of influenza from Nasopharyngeal swab specimens and should not be used as a sole basis for treatment. Nasal washings and aspirates are unacceptable for Xpert  Xpress SARS-CoV-2/FLU/RSV testing.  Fact Sheet for Patients: EntrepreneurPulse.com.au  Fact Sheet for Healthcare Providers: IncredibleEmployment.be  This test is not yet approved or cleared by the [place] FDA and has been authorized for detection and/or diagnosis of SARS-CoV-2 by FDA under an Emergency Use Authorization (EUA). This EUA will remain in effect (meaning this test can be used) for the duration of the COVID-19 declaration under Section 564(b)(1) of the Act, 21 U.S.C. section 360bbb-3(b)(1), unless the authorization is terminated or revoked.  Performed at [facility], [name], [place], [place] [address]     Radiology Reports Portable chest 1 View  Result Date: 08/07/2021 CLINICAL DATA:  65 year old male with respiratory failure.  Smoker. EXAM: PORTABLE CHEST 1 VIEW COMPARISON:  Portable chest 08/06/2021 and earlier. FINDINGS: Portable AP upright view at 0439 hours. Stable lung volumes and mediastinal contours. Borderline cardiomegaly. Stable lung markings. Allowing for portable technique the lungs are clear. No pneumothorax or pleural effusion. Visualized tracheal air column is within normal limits. No acute osseous abnormality identified. IMPRESSION: No acute cardiopulmonary abnormality. Electronically Signed   By: Genevie Ann M.D.   On: 08/07/2021 04:59   DG Chest Port 1 View  Result  Date: 08/06/2021 CLINICAL DATA:  Shortness of breath EXAM: PORTABLE CHEST 1 VIEW COMPARISON:  09/21/2018 FINDINGS: Cardiomegaly. No confluent opacities, effusions or edema. No acute bony abnormality. IMPRESSION: Cardiomegaly.  No active disease. Electronically Signed   By: Rolm Baptise M.D.   On: 08/06/2021 19:45    SIGNED: Deatra James, MD, FHM. [facility],  Pager (please use [email or web address] to page/text) Please use Epic Secure Chat for non-urgent communication (7AM-7PM)  If 7PM-7AM, please contact night-coverage [email or web address], 08/07/2021, 9:47 AM

## 2021-08-07 NOTE — H&P (Signed)
TRH H&P    Patient Demographics:    Shaun [name], is a 65 y.o. male  MRN: [id number]  DOB - 1956-03-31  Admit Date - 08/06/2021  Referring MD/NP/PA: Roderic Palau  Outpatient Primary MD for the patient is Maximiano Coss, NP  Patient coming from: home  Chief complaint- Dyspnea   HPI:    Luismario Coston  is a 65 y.o. male, with history of anxiety, ASCVD, CHF, COPD, diabetes mellitus type 2, hyperlipidemia, hypertension, obstructive sleep apnea, paroxysmal atrial fibrillation, and more presents ED with chief complaint of dyspnea.  Patient reports he has had fever and dyspnea for 1 week.  He reports that he was diagnosed with the flu 6 days ago.  He was given a steroid and Tessalon Perles per his report.  He reports that it was not helpful.  He had a T-max at home of 102.  He tried Tylenol and thinks that he had temporary relief.  Patient does admit to orthopnea but no increase in peripheral edema.  He has a cough but is only occasionally productive with green sputum.  No hemoptysis.  He does have some chest tightness that is been going on for the whole week.  It is worse with exertion, better with rest.  Worse with laying flat as well.  Patient reports feeling lightheaded when he has the chest tightness.  He has not felt near syncopal.  He does not wear oxygen at home.  Patient reports that he has been insomniac all week because of the illness.  He does sleep with a CPAP at home.  Patient does report decreased p.o. intake.  Yesterday all he had all day was chicken soup.  Last bowel movement was yesterday.  Patient has no other complaints at this time.  Patient smokes half a pack a day but declines a nicotine patch as he has not had a cigarette since he has been ill.  He does not drink alcohol.  He does not use illicit drugs.  Patient is vaccinated for COVID but not flu.  Patient is full code.  The ED Temp 99.7, heart  rate 101-125, respiratory rate 16-25, blood pressure 125/88, satting 96% on 3 L nasal cannula No leukocytosis with white blood cell count 10.3, hemoglobin 15 Chemistry panel reveals a decreased bicarb at 21 and an AKI with a creatinine of 1.43 Patient is fluid positive Chest x-ray shows no active disease Admission requested for hypoxia    Review of systems:    In addition to the HPI above,  Admits to fever No Headache, No changes with Vision or hearing, No problems swallowing food or Liquids, Admits to chest tightness, cough, shortness of breath No Abdominal pain, No Nausea or Vomiting, bowel movements are regular, No Blood in stool or Urine, No dysuria, No new skin rashes or bruises, No new joints pains-aches,  No new weakness, tingling, numbness in any extremity, No recent weight gain or loss, No polyuria, polydypsia or polyphagia, No significant Mental Stressors.  All other systems reviewed and are negative.    Past History of the  following :    Past Medical History:  Diagnosis Date   Anemia    Anxiety    Arteriosclerotic cardiovascular disease (ASCVD)    Nonobstructive; cath in 2000: 30-40% mid LAD and proximal RCA;normal EF. stress nuclear in 2006-subtle inferoseptal hypoperfusion with reversibility; negative stress EKG; good exercise tolerance   Asthma    Cancer (HCC)    skin cancer   Cataract    CHF (congestive heart failure) (HCC)    Colon polyps    30 colon polyps found on first colonoscopy   COPD (chronic obstructive pulmonary disease) (HCC)    Diabetes mellitus without complication (HCC)    Diverticulitis    DJD (degenerative joint disease)    GERD (gastroesophageal reflux disease)    History of kidney stones    Hyperlipidemia    Hypertension    Insomnia    Obstructive sleep apnea 12/2009   01/26/2010 AHI 83/hr   Paroxysmal atrial fibrillation ([place]) 10/2004   Onset in 10/2004; recurred 09/2008   PUD (peptic ulcer disease)    1980s   RLS (restless legs  syndrome)    Sinusitis       Past Surgical History:  Procedure Laterality Date   BIOPSY  07/17/2018   Procedure: BIOPSY;  Surgeon: Danie Binder, MD;  [facility];  Service: Endoscopy;;  colon   BOWEL RESECTION  09/17/2018   SMALL BOWEL RESECTION: 71 CM    CARDIAC CATHETERIZATION N/A 07/21/2015   Procedure: Left Heart Cath and Coronary Angiography;  Surgeon: Peter M Martinique, MD;  Location: [place] [facility];  Service: Cardiovascular;  Laterality: N/A;   CARDIAC CATHETERIZATION N/A 07/21/2015   Procedure: Coronary Stent Intervention;  Surgeon: Peter M Martinique, MD;  Location: [place] [facility];  Service: Cardiovascular;  Laterality: N/A;   CIRCUMCISION N/A 04/05/2019   Procedure: CIRCUMCISION ADULT;  Surgeon: Irine Seal, MD;  Location: [facility];  Service: Urology;  Laterality: N/A;   COLONOSCOPY N/A 05/19/2014   Dr. Barnie Alderman diverticulosis/moderate external hemorrhoids, >20 simple adenomas. Genetic screening negative.    COLONOSCOPY WITH PROPOFOL N/A 07/17/2018   Dr. Oneida Alar: Diverticulosis, external/internal hemorrhoids, 32 colon polyps removed.  ten tubular adenomas removed with no high-grade dysplasia.  Advised to have surveillance colonoscopy in 3 years.   COLONOSCOPY WITH PROPOFOL N/A 06/07/2021   Procedure: COLONOSCOPY WITH PROPOFOL;  Surgeon: Eloise Harman, DO;  [facility];  Service: Endoscopy;  Laterality: N/A;  9:30 / ASA 3  (Pt was told that his time will be given at Pre-op)   ESOPHAGOGASTRODUODENOSCOPY (EGD) WITH PROPOFOL N/A 07/17/2018   Dr. Oneida Alar: Low-grade narrowing Schatzki ring at the GE junction status post dilation.  Gastritis.  Biopsy with mild nonspecific reactive gastropathy.  No H. pylori.   GIVENS CAPSULE STUDY N/A 06/24/2019   normal   HERNIA REPAIR  1986   Left inguinal   INTRAVASCULAR PRESSURE WIRE/FFR STUDY Left 06/08/2017   Procedure: INTRAVASCULAR PRESSURE WIRE/FFR STUDY;  Surgeon: Nelva Bush, MD;  Location: [place] [facility];  Service: Cardiovascular;  Laterality: Left;  LAD and CFX   LAPAROTOMY N/A 09/17/2018   Procedure: EXPLORATORY LAPAROTOMY;  Surgeon: Virl Cagey, MD;  Location: [facility];  Service: General;  Laterality: N/A;   LEFT HEART CATH AND CORONARY ANGIOGRAPHY N/A 06/08/2017   Procedure: LEFT HEART CATH AND CORONARY ANGIOGRAPHY;  Surgeon: Nelva Bush, MD;  Location: [place] [facility];  Service: Cardiovascular;  Laterality: N/A;   POLYPECTOMY  07/17/2018   Procedure: POLYPECTOMY;  Surgeon:  Danie Binder, MD;  [facility];  Service: Endoscopy;;  colon   POLYPECTOMY  06/07/2021   Procedure: POLYPECTOMY INTESTINAL;  Surgeon: Eloise Harman, DO;  [facility];  Service: Endoscopy;;   ROTATOR CUFF REPAIR     Right   SAVORY DILATION N/A 07/17/2018   Procedure: SAVORY DILATION;  Surgeon: Danie Binder, MD;  [facility];  Service: Endoscopy;  Laterality: N/A;      Social History:      Social History   Tobacco Use   Smoking status: Every Day    Packs/day: 0.50    Years: 39.00    Pack years: 19.50    Types: Cigarettes    Start date: 07/24/1970   Smokeless tobacco: Never  Substance Use Topics   Alcohol use: No    Alcohol/week: 0.0 standard drinks       Family History :     Family History  Problem Relation Age of Onset   Hypertension Mother    Breast cancer Mother 42       brain/bone    Heart attack Father    Skin cancer Sister 28   Diabetes Brother    Parkinson's disease Brother    Brain cancer Maternal Uncle    Cancer Maternal Uncle        NOS   Breast cancer Cousin        maternal cousin dx <50   Cancer Cousin    Colon cancer Neg Hx       Home Medications:   Prior to Admission medications   Medication Sig Start Date End Date Taking? Authorizing Provider  albuterol (VENTOLIN HFA) 108 (90 Base) MCG/ACT inhaler INHALE 2 PUFFS INTO THE LUNGS EVERY 6 HOURS AS NEEDED FOR WHEEZING OR SHORTNESS OF BREATH 08/01/21  Yes Hawks,  Christy A, FNP  apixaban (ELIQUIS) 5 MG TABS tablet TAKE 1 TABLET(5 MG) BY MOUTH TWICE DAILY 05/05/21  Yes Lelon Perla, MD  ARIPiprazole (ABILIFY) 2 MG tablet Take 1 tablet (2 mg total) by mouth daily. 05/11/21  Yes Maximiano Coss, NP  atorvastatin (LIPITOR) 80 MG tablet Take 1 tablet (80 mg total) by mouth daily. TAKE 1 TABLET(80 MG) BY MOUTH EVERY EVENING 05/04/21  Yes Loel Dubonnet, NP  benzonatate (TESSALON PERLES) 100 MG capsule Take 1 capsule (100 mg total) by mouth 3 (three) times daily as needed. 08/01/21  Yes Hawks, Christy A, FNP  buPROPion (WELLBUTRIN XL) 150 MG 24 hr tablet Take 3 tablets once daily Patient taking differently: Take 150 mg by mouth 3 (three) times daily with meals. 02/10/21  Yes Maximiano Coss, NP  docusate sodium (COLACE) 100 MG capsule Take 100 mg by mouth daily as needed for mild constipation.   Yes [provider]  escitalopram (LEXAPRO) 20 MG tablet TAKE 1 TABLET(20 MG) BY MOUTH DAILY 03/13/20  Yes Lovena Le, Malena M, DO  ezetimibe (ZETIA) 10 MG tablet TAKE 1 TABLET BY MOUTH EVERY DAY 07/21/21 07/21/22 Yes Lelon Perla, MD  ferrous sulfate 325 (65 FE) MG tablet Take 325 mg by mouth daily with breakfast.   Yes [provider]  furosemide (LASIX) 40 MG tablet Take 1 tablet (40 mg total) by mouth daily. 05/04/21 04/29/22 Yes Loel Dubonnet, NP  lisinopril (ZESTRIL) 2.5 MG tablet TAKE 1 TABLET(2.5 MG) BY MOUTH DAILY. 02/04/21 02/04/22 Yes Crenshaw, Denice Bors, MD  meclizine (ANTIVERT) 25 MG tablet Take 1 tablet (25 mg total) by mouth 3 (three) times daily as needed  for dizziness. 10/18/16  Yes Daleen Bo, MD  metFORMIN (GLUCOPHAGE) 500 MG tablet TAKE ONE TABLET (500 MG) BY MOUTH TWICE DAILY WITH A MEAL. 06/03/21 06/03/22 Yes Maximiano Coss, NP  metoprolol tartrate (LOPRESSOR) 100 MG tablet Take 1 tablet (100 mg total) by mouth 2 (two) times daily. 07/29/21  Yes Maximiano Coss, NP  nitroGLYCERIN (NITROSTAT) 0.4 MG SL tablet Place 1 tablet (0.4 mg total)  under the tongue every 5 (five) minutes as needed. 06/30/15  Yes Lendon Colonel, NP  omeprazole (PRILOSEC) 40 MG capsule Take 1 capsule by mouth once daily 30 minutes before breakfast 12/31/20  Yes Annitta Needs, NP  ondansetron (ZOFRAN) 4 MG tablet Take 1 tablet (4 mg total) by mouth every 8 (eight) hours as needed for nausea or vomiting. 12/31/19  Yes Annitta Needs, NP  potassium chloride SA (KLOR-CON) 20 MEQ tablet Take 1 tablet (20 mEq total) by mouth daily. 04/01/21  Yes Lelon Perla, MD  rOPINIRole (REQUIP) 3 MG tablet TAKE 1 TABLET BY MOUTH EVERY NIGHT AT BEDTIME 03/17/21 03/17/22 Yes Maximiano Coss, NP  sildenafil (VIAGRA) 100 MG tablet TAKE 1 TABLET BY MOUTH 30 MINUTES BEFORE ACTIVITY 07/30/21  Yes Lelon Perla, MD  sucralfate (CARAFATE) 1 g tablet Take one tablet po BID PRN Patient taking differently: Take 1 g by mouth 2 (two) times daily as needed (acid reflux). 01/08/20  Yes Mikey Kirschner, MD  traMADol (ULTRAM) 50 MG tablet Take 1 tablet (50 mg total) by mouth twice daily as needed for moderate pain 05/20/21  Yes Kirsteins, Luanna Salk, MD  traZODone (DESYREL) 50 MG tablet TAKE 2 TABLETS (100 MG) BY MOUTH AT BEDTIME 02/09/21 02/09/22 Yes Maximiano Coss, NP  pseudoephedrine (SUDAFED) 30 MG tablet Take 30 mg by mouth every 6 (six) hours as needed for congestion. Patient not taking: Reported on 08/06/2021    [provider]     Allergies:    No Known Allergies   Physical Exam:   Vitals  Blood pressure 94/65, pulse 89, temperature 99.7 F (37.6 C), temperature source Oral, resp. rate 20, weight 95.3 kg, SpO2 97 %.   1.  General: Patient lying supine in bed, with head of bed at 90 degrees, visibly ill   2. Psychiatric: Alert and oriented x 3, mood and behavior normal for situation, pleasant and cooperative with exam   3. Neurologic: Speech and language are normal, face is symmetric, moves all 4 extremities voluntarily, at baseline without acute deficits on limited  exam   4. HEENMT:  Head is atraumatic, normocephalic, pupils reactive to light, neck is supple, trachea is midline, mucous membranes are moist   5. Respiratory : Diminished in the lower lung fields, without  rhonchi, rales, no cyanosis, tachypnea, increased work of breathing when speaking, speaking full sentences   6. Cardiovascular : Heart rate tachycardic, rhythm is irregularly irregular, no murmurs, rubs or gallops, no peripheral edema, peripheral pulses palpated   7. Gastrointestinal:  Abdomen is soft, nondistended, nontender to palpation bowel sounds active, no masses or organomegaly palpated   8. Skin:  Skin is warm, dry and intact without rashes, acute lesions, or ulcers on limited exam   9.Musculoskeletal:  No acute deformities or trauma, no asymmetry in tone, no peripheral edema, peripheral pulses palpated, no tenderness to palpation in the extremities     Data Review:    CBC Recent Labs  Lab 08/06/21 1908  WBC 10.3  HGB 15.0  HCT 44.7  PLT 141*  MCV 86.5  MCH 29.0  MCHC 33.6  RDW 13.3   ------------------------------------------------------------------------------------------------------------------  Results for orders placed or performed during the hospital encounter of 08/06/21 (from the past 48 hour(s))  Basic metabolic panel     Status: Abnormal   Collection Time: 08/06/21  7:08 PM  Result Value Ref Range   Sodium 132 (L) 135 - 145 mmol/L   Potassium 3.9 3.5 - 5.1 mmol/L   Chloride 102 98 - 111 mmol/L   CO2 21 (L) 22 - 32 mmol/L   Glucose, Bld 158 (H) 70 - 99 mg/dL    Comment: Glucose reference range applies only to samples taken after fasting for at least 8 hours.   BUN 24 (H) 8 - 23 mg/dL   Creatinine, Ser 1.43 (H) 0.61 - 1.24 mg/dL   Calcium 8.1 (L) 8.9 - 10.3 mg/dL   GFR, Estimated 54 (L) >60 mL/min    Comment: (NOTE) Calculated using the CKD-EPI Creatinine Equation (2021)    Anion gap 9 5 - 15    Comment: Performed at [facility],  [address], [place], [place] [address]  CBC     Status: Abnormal   Collection Time: 08/06/21  7:08 PM  Result Value Ref Range   WBC 10.3 4.0 - 10.5 K/uL   RBC 5.17 4.22 - 5.81 MIL/uL   Hemoglobin 15.0 13.0 - 17.0 g/dL   HCT 44.7 39.0 - 52.0 %   MCV 86.5 80.0 - 100.0 fL   MCH 29.0 26.0 - 34.0 pg   MCHC 33.6 30.0 - 36.0 g/dL   RDW 13.3 11.5 - 15.5 %   Platelets 141 (L) 150 - 400 K/uL   nRBC 0.0 0.0 - 0.2 %    Comment: Performed at [facility], [address] [place], [place] [address]  Protime-INR (order if Patient is taking Coumadin / Warfarin)     Status: None   Collection Time: 08/06/21  7:08 PM  Result Value Ref Range   Prothrombin Time 13.6 11.4 - 15.2 seconds   INR 1.0 0.8 - 1.2    Comment: (NOTE) INR goal varies based on device and disease states. Performed at [facility], [address], [place], [place] [address]   Hepatic function panel     Status: Abnormal   Collection Time: 08/06/21  7:08 PM  Result Value Ref Range   Total Protein 6.8 6.5 - 8.1 g/dL   Albumin 3.5 3.5 - 5.0 g/dL   AST 36 15 - 41 U/L   ALT 53 (H) 0 - 44 U/L   Alkaline Phosphatase 96 38 - 126 U/L   Total Bilirubin 0.7 0.3 - 1.2 mg/dL   Bilirubin, Direct 0.3 (H) 0.0 - 0.2 mg/dL   Indirect Bilirubin 0.4 0.3 - 0.9 mg/dL    Comment: Performed at [facility], [address] [place], [place] [address]  Brain natriuretic peptide     Status: None   Collection Time: 08/06/21  7:08 PM  Result Value Ref Range   B Natriuretic Peptide 75.0 0.0 - 100.0 pg/mL    Comment: Performed at [facility], [address] [place],  [address]  Resp Panel by RT-PCR (Flu A&B, Covid) Nasopharyngeal Swab     Status: Abnormal   Collection Time: 08/06/21  7:28 PM   Specimen: Nasopharyngeal Swab; Nasopharyngeal(NP) swabs in vial transport medium  Result Value Ref Range   SARS Coronavirus 2 by RT PCR NEGATIVE NEGATIVE    Comment: (NOTE) SARS-CoV-2 target nucleic acids are NOT DETECTED.  The SARS-CoV-2 RNA is  generally  detectable in upper respiratory specimens during the acute phase of infection. The lowest concentration of SARS-CoV-2 viral copies this assay can detect is 138 copies/mL. A negative result does not preclude SARS-Cov-2 infection and should not be used as the sole basis for treatment or other patient management decisions. A negative result may occur with  improper specimen collection/handling, submission of specimen other than nasopharyngeal swab, presence of viral mutation(s) within the areas targeted by this assay, and inadequate number of viral copies(<138 copies/mL). A negative result must be combined with clinical observations, patient history, and epidemiological information. The expected result is Negative.  Fact Sheet for Patients:  EntrepreneurPulse.com.au  Fact Sheet for Healthcare Providers:  IncredibleEmployment.be  This test is no t yet approved or cleared by the [place] FDA and  has been authorized for detection and/or diagnosis of SARS-CoV-2 by FDA under an Emergency Use Authorization (EUA). This EUA will remain  in effect (meaning this test can be used) for the duration of the COVID-19 declaration under Section 564(b)(1) of the Act, 21 U.S.C.section 360bbb-3(b)(1), unless the authorization is terminated  or revoked sooner.       Influenza A by PCR POSITIVE (A) NEGATIVE   Influenza B by PCR NEGATIVE NEGATIVE    Comment: (NOTE) The Xpert Xpress SARS-CoV-2/FLU/RSV plus assay is intended as an aid in the diagnosis of influenza from Nasopharyngeal swab specimens and should not be used as a sole basis for treatment. Nasal washings and aspirates are unacceptable for Xpert Xpress SARS-CoV-2/FLU/RSV testing.  Fact Sheet for Patients: EntrepreneurPulse.com.au  Fact Sheet for Healthcare Providers: IncredibleEmployment.be  This test is not yet approved or cleared by the [place] FDA and has  been authorized for detection and/or diagnosis of SARS-CoV-2 by FDA under an Emergency Use Authorization (EUA). This EUA will remain in effect (meaning this test can be used) for the duration of the COVID-19 declaration under Section 564(b)(1) of the Act, 21 U.S.C. section 360bbb-3(b)(1), unless the authorization is terminated or revoked.  Performed at [facility], [address], [place], [place] [address]   Troponin I (High Sensitivity)     Status: None   Collection Time: 08/06/21  9:40 PM  Result Value Ref Range   Troponin I (High Sensitivity) 4 <18 ng/L    Comment: (NOTE) Elevated high sensitivity troponin I (hsTnI) values and significant  changes across serial measurements may suggest ACS but many other  chronic and acute conditions are known to elevate hsTnI results.  Refer to the "Links" section for chest pain algorithms and additional  guidance. Performed at [facility], [address], [place], [place] [address]   Blood gas, venous     Status: Abnormal   Collection Time: 08/06/21  9:40 PM  Result Value Ref Range   FIO2 28.00    pH, Ven 7.353 7.250 - 7.430   pCO2, Ven 48.5 44.0 - 60.0 mmHg   pO2, Ven <31.0 (LL) 32.0 - 45.0 mmHg    Comment: CRITICAL RESULT CALLED TO, READ BACK BY AND VERIFIED WITH: BRAME,M AT 2200 ON 12.9.22 BY RUCINSKI,B    Bicarbonate 23.1 20.0 - 28.0 mmol/L   Acid-Base Excess 1.2 0.0 - 2.0 mmol/L   O2 Saturation 24.9 %   Patient temperature 37.6    Collection site LEFT ANTECUBITAL    Drawn by 1540    Sample type VENOUS     Comment: Performed at [facility], [address] [place], [place] [address]  Troponin I (High Sensitivity)     Status:  None   Collection Time: 08/06/21 11:48 PM  Result Value Ref Range   Troponin I (High Sensitivity) 4 <18 ng/L    Comment: (NOTE) Elevated high sensitivity troponin I (hsTnI) values and significant  changes across serial measurements may suggest ACS but many other  chronic and acute conditions are known to  elevate hsTnI results.  Refer to the "Links" section for chest pain algorithms and additional  guidance. Performed at [facility], [address], [place], [place] [address]     Chemistries  Recent Labs  Lab 08/06/21 1908  NA 132*  K 3.9  CL 102  CO2 21*  GLUCOSE 158*  BUN 24*  CREATININE 1.43*  CALCIUM 8.1*  AST 36  ALT 53*  ALKPHOS 96  BILITOT 0.7   ------------------------------------------------------------------------------------------------------------------  ------------------------------------------------------------------------------------------------------------------ GFR: Estimated Creatinine Clearance: 57.7 mL/min (A) (by C-G formula based on SCr of 1.43 mg/dL (H)). Liver Function Tests: Recent Labs  Lab 08/06/21 1908  AST 36  ALT 53*  ALKPHOS 96  BILITOT 0.7  PROT 6.8  ALBUMIN 3.5   No results for input(s): LIPASE, AMYLASE in the last 168 hours. No results for input(s): AMMONIA in the last 168 hours. Coagulation Profile: Recent Labs  Lab 08/06/21 1908  INR 1.0   Cardiac Enzymes: No results for input(s): CKTOTAL, CKMB, CKMBINDEX, TROPONINI in the last 168 hours. BNP (last 3 results) No results for input(s): PROBNP in the last 8760 hours. HbA1C: No results for input(s): HGBA1C in the last 72 hours. CBG: No results for input(s): GLUCAP in the last 168 hours. Lipid Profile: No results for input(s): CHOL, HDL, LDLCALC, TRIG, CHOLHDL, LDLDIRECT in the last 72 hours. Thyroid Function Tests: No results for input(s): TSH, T4TOTAL, FREET4, T3FREE, THYROIDAB in the last 72 hours. Anemia Panel: No results for input(s): VITAMINB12, FOLATE, FERRITIN, TIBC, IRON, RETICCTPCT in the last 72 hours.  --------------------------------------------------------------------------------------------------------------- Urine analysis:    Component Value Date/Time   COLORURINE YELLOW 09/17/2018 [place] 09/17/2018 1347   LABSPEC 1.008 09/17/2018  1347   PHURINE 7.0 09/17/2018 1347   GLUCOSEU NEGATIVE 09/17/2018 1347   HGBUR NEGATIVE 09/17/2018 1347   BILIRUBINUR NEGATIVE 09/17/2018 [place] 09/17/2018 1347   PROTEINUR NEGATIVE 09/17/2018 1347   UROBILINOGEN 1.0 12/10/2011 0011   NITRITE NEGATIVE 09/17/2018 1347   LEUKOCYTESUR NEGATIVE 09/17/2018 1347      Imaging Results:    DG Chest Port 1 View  Result Date: 08/06/2021 CLINICAL DATA:  Shortness of breath EXAM: PORTABLE CHEST 1 VIEW COMPARISON:  09/21/2018 FINDINGS: Cardiomegaly. No confluent opacities, effusions or edema. No acute bony abnormality. IMPRESSION: Cardiomegaly.  No active disease. Electronically Signed   By: Rolm Baptise M.D.   On: 08/06/2021 19:45       Assessment & Plan:    Principal Problem:   Acute respiratory failure with hypoxia (HCC) Active Problems:   Chronic atrial fibrillation (HCC)   Tobacco abuse   Obstructive sleep apnea   Essential hypertension   Non-insulin treated type 2 diabetes mellitus (HCC)   COPD (chronic obstructive pulmonary disease) (HCC)   Chronic diastolic CHF (congestive heart failure) (HCC)   Acute respiratory failure with hypoxia Likely secondary to flu Patient has history of COPD, but no CO2 retention or indication of flare on VBG, no wheezing History of CHF-patient is complaining of orthopnea which is normal for him, no increase in peripheral edema COVID-negative Chest x-ray shows no active disease Patient is out of Tamiflu window Continue supportive care Wean off O2  as tolerated Influenza A See plan above AKI Creatinine 0.89 baseline, today 1.43 Hold ACE, hold Lasix Hold other nephrotoxic agents when possible Due to history of CHF holding off on IV hydration, but encouraging p.o. intake Continue to monitor Chronic atrial fibrillation Continue Lopressor and Eliquis Chronic diastolic CHF Not currently in exacerbation Daily weights Currently encouraging p.o. intake secondary to  AKI/dehydration Continue to monitor Obstructive sleep apnea Continue CPAP COPD No indication of CO2 retention on VBG Continue breathing treatments as needed Solu-Medrol 125 given in the ED Diabetes mellitus type 2 Hold oral hypoglycemics Continue sliding scale coverage Tobacco use disorder Counseled patient on importance of cessation   DVT Prophylaxis-   Eliquis- SCDs   AM Labs Ordered, also please review Full Orders  Family Communication: No family at bedside  Code Status: Full  Admission status: Inpatient :The appropriate admission status for this patient is INPATIENT. Inpatient status is judged to be reasonable and necessary in order to provide the required intensity of service to ensure the patient's safety. The patient's presenting symptoms, physical exam findings, and initial radiographic and laboratory data in the context of their chronic comorbidities is felt to place them at high risk for further clinical deterioration. Furthermore, it is not anticipated that the patient will be medically stable for discharge from the hospital within 2 midnights of admission. The following factors support the admission status of inpatient.     The patient's presenting symptoms include dyspnea and cough. The worrisome physical exam findings include tachycardia, tachypnea, hypoxia. The initial radiographic and laboratory data are worrisome because of influenza A positive. The chronic co-morbidities include diabetes mellitus type 2, hypertension, hyperlipidemia, obstructive sleep apnea, atrial fibrillation.       * I certify that at the point of admission it is my clinical judgment that the patient will require inpatient hospital care spanning beyond 2 midnights from the point of admission due to high intensity of service, high risk for further deterioration and high frequency of surveillance required.*  Disposition: Anticipated Discharge date 72 hours discharge to home  Time spent in  minutes : [place] DO

## 2021-08-07 NOTE — Progress Notes (Signed)
PT arrived to room #307 via [facility] from ED. Pt ambulated to bed from chair with some dyspnea noted with exertion. Pt on 5lpm [place] O2 at this time. Congested cough, small amount green phlegm expectorated. VSS. Pt with hx afib, heart rate irregular to apical auscultation. Tele shows afib. Pt oriented to room and safety procedures, states understanding. Bed alarm on for safety, urinal at bedside. Pt tolerating clear liquids. Family at bedside.

## 2021-08-08 DIAGNOSIS — J9601 Acute respiratory failure with hypoxia: Secondary | ICD-10-CM | POA: Diagnosis not present

## 2021-08-08 LAB — BASIC METABOLIC PANEL
Anion gap: 9 (ref 5–15)
BUN: 28 mg/dL — ABNORMAL HIGH (ref 8–23)
CO2: 27 mmol/L (ref 22–32)
Calcium: 9 mg/dL (ref 8.9–10.3)
Chloride: 100 mmol/L (ref 98–111)
Creatinine, Ser: 0.93 mg/dL (ref 0.61–1.24)
GFR, Estimated: >60 mL/min (ref >60)
Glucose, Bld: 152 mg/dL — ABNORMAL HIGH (ref 70–99)
Potassium: 4.8 mmol/L (ref 3.5–5.1)
Sodium: 136 mmol/L (ref 135–145)

## 2021-08-08 LAB — GLUCOSE, CAPILLARY
Glucose-Capillary: 269 mg/dL — ABNORMAL HIGH (ref 70–99)
Glucose-Capillary: 153 mg/dL — ABNORMAL HIGH (ref 70–99)
Glucose-Capillary: 154 mg/dL — ABNORMAL HIGH (ref 70–99)

## 2021-08-08 LAB — CBC
HCT: 45.2 % (ref 39.0–52.0)
Hemoglobin: 14.7 g/dL (ref 13.0–17.0)
MCH: 28.3 pg (ref 26.0–34.0)
MCHC: 32.5 g/dL (ref 30.0–36.0)
MCV: 86.9 fL (ref 80.0–100.0)
Platelets: 187 10*3/uL (ref 150–400)
RBC: 5.2 MIL/uL (ref 4.22–5.81)
RDW: 13.4 % (ref 11.5–15.5)
WBC: 14.1 10*3/uL — ABNORMAL HIGH (ref 4.0–10.5)
nRBC: 0 % (ref 0.0–0.2)

## 2021-08-08 MED ORDER — FUROSEMIDE 40 MG PO TABS
40.0000 mg | ORAL_TABLET | Freq: Every day | ORAL | Status: DC
  Administered 2021-08-08 – 2021-08-09 (×2): 40 mg via ORAL
  Filled 2021-08-08 (×2): qty 1

## 2021-08-08 MED ORDER — LEVALBUTEROL HCL 1.25 MG/0.5ML IN NEBU
1.2500 mg | INHALATION_SOLUTION | Freq: Two times a day (BID) | RESPIRATORY_TRACT | Status: DC
  Administered 2021-08-09 – 2021-08-11 (×5): 1.25 mg via RESPIRATORY_TRACT
  Filled 2021-08-08 (×5): qty 0.5

## 2021-08-08 MED ORDER — LEVALBUTEROL HCL 1.25 MG/0.5ML IN NEBU
1.2500 mg | INHALATION_SOLUTION | Freq: Four times a day (QID) | RESPIRATORY_TRACT | Status: DC | PRN
  Administered 2021-08-09: 1.25 mg via RESPIRATORY_TRACT
  Filled 2021-08-08: qty 0.5

## 2021-08-08 MED ORDER — IPRATROPIUM BROMIDE 0.02 % IN SOLN
0.5000 mg | Freq: Two times a day (BID) | RESPIRATORY_TRACT | Status: DC
  Administered 2021-08-09 – 2021-08-11 (×5): 0.5 mg via RESPIRATORY_TRACT
  Filled 2021-08-08 (×5): qty 2.5

## 2021-08-08 MED ORDER — COVID-19MRNA BIVAL VACC PFIZER 30 MCG/0.3ML IM SUSP
0.3000 mL | Freq: Once | INTRAMUSCULAR | Status: DC
  Filled 2021-08-08: qty 0.3

## 2021-08-08 NOTE — Progress Notes (Signed)
PROGRESS NOTE    Patient: Shaun [name]                            PCP: Maximiano Coss, NP                    DOB: 1955/11/12            DOA: 08/06/2021 CNO:[id number]             DOS: 08/08/2021, 10:43 AM   LOS: 2 days   Date of Service: The patient was seen and examined on 08/08/2021  Subjective:   The patient was seen and examined this morning, sitting in the side of bed, still requiring 5 L of oxygen, satting 95%, Worsening shortness of breath with minimal exertion Brief Narrative:   Shaun [name]  is a 65 y.o. male, with history of anxiety, ASCVD, CHF, COPD, diabetes mellitus type 2, hyperlipidemia, hypertension, obstructive sleep apnea, paroxysmal atrial fibrillation, and more presents ED with chief complaint of dyspnea.  Patient reports he has had fever and dyspnea for 1 week.  He reports that he was diagnosed with the flu 6 days ago.  He was given a steroid and Tessalon Perles per his report.  He reports that it was not helpful.  He had a T-max at home of 102.  He tried Tylenol and thinks that he had temporary relief.  He has a cough but is only occasionally productive with green sputum.   He does not wear oxygen at home.  He does sleep with a CPAP at home.  Pa   Patient smokes half a pack a day but declines a nicotine patch as he has not had a cigarette since he has been ill.   Patient is vaccinated for COVID but not flu.  Patient is full code.  ED Temp 99.7, heart rate 101-125, respiratory rate 16-25, blood pressure 125/88, satting 96% on 3 L nasal cannula No leukocytosis with white blood cell count 10.3, hemoglobin 15 Chemistry panel reveals a decreased bicarb at 21 and an AKI with a creatinine of 1.43 Patient is fluid positive Chest x-ray shows no active disease    Assessment & Plan:   Principal Problem:   Acute respiratory failure with hypoxia (HCC) Active Problems:   Chronic atrial fibrillation (HCC)   Tobacco abuse   Obstructive sleep apnea   Essential  hypertension   Non-insulin treated type 2 diabetes mellitus (HCC)   COPD (chronic obstructive pulmonary disease) (HCC)   Chronic diastolic CHF (congestive heart failure) (HCC)  Principal Problem:    Acute respiratory failure with hypoxia (HCC)-due to influenza A  -Still requiring 5 L of oxygen, satting 95%  -Continue supportive care, pulmonary toiletry  -Chest x-ray clear no signs of pneumonia, WBC 10.3 -ABG 7.35/48.5/<31/23.1 -Continue aggressive treatment with IV steroids, DuoNeb bronchodilators, mucolytic, -Although patient has symptoms > 6 days still symptomatic with low-grade fever initiating Tamiflu for 5 days -Symptom may be superimposed by COPD, CHF, OSA,--although no signs of exacerbation (BNP 75, -We will monitor closely   Acute renal insufficiency -Improving -Baseline creatinine around 0.89, Cr. On admission>> 1.43 >>> 1.08 >> 0.93 -Holding ACE inhibitor's and Lasix -We will try to avoid nephrotoxins -Monitor kidney function closely   Active Problems:  Chronic atrial fibrillation (HCC) -Stable, continue home medication Lopressor and Eliquis  Tobacco abuse -advise regarding smoking cessation, declined NicoDerm patch  Obstructive sleep apnea -remains on 5 L oxygen, as needed nightly  CPAP    Essential hypertension -stable, resuming home medication    Non-insulin treated type 2 diabetes mellitus (HCC) -CBG QA CHS, SSI coverage    COPD (chronic obstructive pulmonary disease) (HCC) -management as above   Chronic diastolic CHF (congestive heart failure) (HCC) -monitoring daily weights, I's and O's, resuming home dose Lasix   ------------------------------------------------------------------------------------------------------------------------------------------ Cultures; Blood Cultures x 2 >> NGT   Antimicrobials: Tamiflu   Consultants:  None  -------------------------------------------------------------------------------------------------------------------------------------  DVT prophylaxis:  SCD/Compression stockings and MV:HQIONGE Code Status:   Code Status: Full Code  Family Communication: Wife present at bedside updated The above findings and plan of care has been discussed with patient (and family)  in detail,  they expressed understanding and agreement of above. -Advance care planning has been discussed.   Admission status:   Status is: Inpatient  Remains inpatient appropriate because: As patient in respiratory failure requiring supplemental oxygen 5 L hypoxic... Needing aggressive treatment for underlying influenza A treatment, supportive therapy      Level of care: Telemetry   Procedures:   No admission procedures for hospital encounter.    Antimicrobials:  Anti-infectives (From admission, onward)    Start     Dose/Rate Route Frequency Ordered Stop   08/07/21 0800  oseltamivir (TAMIFLU) capsule 75 mg        75 mg Oral 2 times daily 08/07/21 0711 08/12/21 0959        Medication:   apixaban  5 mg Oral BID   ARIPiprazole  2 mg Oral Daily   atorvastatin  80 mg Oral Daily   buPROPion  150 mg Oral TID WC   escitalopram  20 mg Oral Daily   ezetimibe  10 mg Oral Daily   guaiFENesin-dextromethorphan  10 mL Oral Q8H   insulin aspart  0-15 Units Subcutaneous TID WC   insulin aspart  0-5 Units Subcutaneous QHS   ipratropium  0.5 mg Nebulization Q6H   levalbuterol  1.25 mg Nebulization Q6H   methylPREDNISolone (SOLU-MEDROL) injection  40 mg Intravenous Q12H   metoprolol tartrate  100 mg Oral BID   oseltamivir  75 mg Oral BID   rOPINIRole  1 mg Oral QHS   traZODone  100 mg Oral QHS    acetaminophen **OR** acetaminophen, benzonatate, meclizine, ondansetron **OR** ondansetron (ZOFRAN) IV, oxyCODONE   Objective:   Vitals:   08/08/21 0225 08/08/21 0526 08/08/21 0545 08/08/21 0819  BP:    126/80   Pulse:   85   Resp:   17   Temp:      TempSrc:      SpO2: 94%  95% 95%  Weight:  94.6 kg    Height:        Intake/Output Summary (Last 24 hours) at 08/08/2021 1043 Last data filed at 08/08/2021 0900 Gross per 24 hour  Intake 840 ml  Output 1200 ml  Net -360 ml   Filed Weights   08/06/21 1904 08/07/21 0903 08/08/21 0526  Weight: 95.3 kg 94.4 kg 94.6 kg     Examination:      Physical Exam:   General:  Alert, oriented, cooperative, mild-moderate acute distress distress;  Shortness of breath on 5 L oxygen  HEENT:  Normocephalic, PERRL, otherwise with in Normal limits   Neuro:  CNII-XII intact. , normal motor and sensation, reflexes intact   Lungs:   Diffuse rhonchi, no wheezes / or crackles  Cardio:    S1/S2, RRR, No murmure, No Rubs or Gallops   Abdomen:   Soft, non-tender, bowel sounds  active all four quadrants,  no guarding or peritoneal signs.  Muscular skeletal:  Limited exam - in bed, able to move all 4 extremities, Normal strength,  2+ pulses,  symmetric, No pitting edema  Skin:  Dry, warm to touch, negative for any Rashes,  Wounds: Please see nursing documentation        ---------------------------------------------------------------------------------------------------------------------------------    LABs:  CBC Latest Ref Rng & Units 08/08/2021 08/06/2021 06/02/2021  WBC 4.0 - 10.5 K/uL 14.1(H) 10.3 6.8  Hemoglobin 13.0 - 17.0 g/dL 14.7 15.0 14.4  Hematocrit 39.0 - 52.0 % 45.2 44.7 42.6  Platelets 150 - 400 K/uL 187 141(L) 166   CMP Latest Ref Rng & Units 08/08/2021 08/07/2021 08/06/2021  Glucose 70 - 99 mg/dL 152(H) 194(H) 158(H)  BUN 8 - 23 mg/dL 28(H) 24(H) 24(H)  Creatinine 0.61 - 1.24 mg/dL 0.93 1.08 1.43(H)  Sodium 135 - 145 mmol/L 136 134(L) 132(L)  Potassium 3.5 - 5.1 mmol/L 4.8 5.0 3.9  Chloride 98 - 111 mmol/L 100 102 102  CO2 22 - 32 mmol/L 27 26 21(L)  Calcium 8.9 - 10.3 mg/dL 9.0 8.6(L) 8.1(L)  Total Protein 6.5 - 8.1 g/dL - 6.9  6.8  Total Bilirubin 0.3 - 1.2 mg/dL - 1.0 0.7  Alkaline Phos 38 - 126 U/L - 96 96  AST 15 - 41 U/L - 24 36  ALT 0 - 44 U/L - 50(H) 53(H)       Micro Results Recent Results (from the past 240 hour(s))  Resp Panel by RT-PCR (Flu A&B, Covid) Nasopharyngeal Swab     Status: Abnormal   Collection Time: 08/06/21  7:28 PM   Specimen: Nasopharyngeal Swab; Nasopharyngeal(NP) swabs in vial transport medium  Result Value Ref Range Status   SARS Coronavirus 2 by RT PCR NEGATIVE NEGATIVE Final    Comment: (NOTE) SARS-CoV-2 target nucleic acids are NOT DETECTED.  The SARS-CoV-2 RNA is generally detectable in upper respiratory specimens during the acute phase of infection. The lowest concentration of SARS-CoV-2 viral copies this assay can detect is 138 copies/mL. A negative result does not preclude SARS-Cov-2 infection and should not be used as the sole basis for treatment or other patient management decisions. A negative result may occur with  improper specimen collection/handling, submission of specimen other than nasopharyngeal swab, presence of viral mutation(s) within the areas targeted by this assay, and inadequate number of viral copies(<138 copies/mL). A negative result must be combined with clinical observations, patient history, and epidemiological information. The expected result is Negative.  Fact Sheet for Patients:  EntrepreneurPulse.com.au  Fact Sheet for Healthcare Providers:  IncredibleEmployment.be  This test is no t yet approved or cleared by the [place] FDA and  has been authorized for detection and/or diagnosis of SARS-CoV-2 by FDA under an Emergency Use Authorization (EUA). This EUA will remain  in effect (meaning this test can be used) for the duration of the COVID-19 declaration under Section 564(b)(1) of the Act, 21 U.S.C.section 360bbb-3(b)(1), unless the authorization is terminated  or revoked sooner.        Influenza A by PCR POSITIVE (A) NEGATIVE Final   Influenza B by PCR NEGATIVE NEGATIVE Final    Comment: (NOTE) The Xpert Xpress SARS-CoV-2/FLU/RSV plus assay is intended as an aid in the diagnosis of influenza from Nasopharyngeal swab specimens and should not be used as a sole basis for treatment. Nasal washings and aspirates are unacceptable for Xpert Xpress SARS-CoV-2/FLU/RSV testing.  Fact Sheet for Patients: EntrepreneurPulse.com.au  Fact Sheet for Healthcare Providers:  IncredibleEmployment.be  This test is not yet approved or cleared by the [place] and has been authorized for detection and/or diagnosis of SARS-CoV-2 by FDA under an Emergency Use Authorization (EUA). This EUA will remain in effect (meaning this test can be used) for the duration of the COVID-19 declaration under Section 564(b)(1) of the Act, 21 U.S.C. section 360bbb-3(b)(1), unless the authorization is terminated or revoked.  Performed at [facility], [address] [place], [place] [address]     Radiology Reports Portable chest 1 View  Result Date: 08/07/2021 CLINICAL DATA:  65 year old male with respiratory failure.  Smoker. EXAM: PORTABLE CHEST 1 VIEW COMPARISON:  Portable chest 08/06/2021 and earlier. FINDINGS: Portable AP upright view at 0439 hours. Stable lung volumes and mediastinal contours. Borderline cardiomegaly. Stable lung markings. Allowing for portable technique the lungs are clear. No pneumothorax or pleural effusion. Visualized tracheal air column is within normal limits. No acute osseous abnormality identified. IMPRESSION: No acute cardiopulmonary abnormality. Electronically Signed   By: Genevie Ann M.D.   On: 08/07/2021 04:59   DG Chest Port 1 View  Result Date: 08/06/2021 CLINICAL DATA:  Shortness of breath EXAM: PORTABLE CHEST 1 VIEW COMPARISON:  09/21/2018 FINDINGS: Cardiomegaly. No confluent opacities, effusions or edema. No acute bony  abnormality. IMPRESSION: Cardiomegaly.  No active disease. Electronically Signed   By: Rolm Baptise M.D.   On: 08/06/2021 19:45    SIGNED: Deatra James, MD, FHM. [facility],  Pager (please use [email or web address] to page/text) Please use Epic Secure Chat for non-urgent communication (7AM-7PM)  If 7PM-7AM, please contact night-coverage [email or web address], 08/08/2021, 10:43 AM

## 2021-08-08 NOTE — Progress Notes (Signed)
Patient stated he wanted to come off CPAP because he was having trouble sleeping anyway.  Placed patient back on 5L [place] after breathing treatment.

## 2021-08-09 ENCOUNTER — Inpatient Hospital Stay (HOSPITAL_COMMUNITY): Payer: 59

## 2021-08-09 ENCOUNTER — Encounter (HOSPITAL_COMMUNITY): Payer: Self-pay | Admitting: Family Medicine

## 2021-08-09 ENCOUNTER — Inpatient Hospital Stay (HOSPITAL_COMMUNITY): Payer: 59 | Admitting: Anesthesiology

## 2021-08-09 DIAGNOSIS — J111 Influenza due to unidentified influenza virus with other respiratory manifestations: Secondary | ICD-10-CM | POA: Diagnosis present

## 2021-08-09 DIAGNOSIS — I10 Essential (primary) hypertension: Secondary | ICD-10-CM

## 2021-08-09 DIAGNOSIS — I4891 Unspecified atrial fibrillation: Secondary | ICD-10-CM

## 2021-08-09 DIAGNOSIS — J9601 Acute respiratory failure with hypoxia: Secondary | ICD-10-CM

## 2021-08-09 DIAGNOSIS — G4733 Obstructive sleep apnea (adult) (pediatric): Secondary | ICD-10-CM

## 2021-08-09 LAB — CBC WITH DIFFERENTIAL/PLATELET
Abs Immature Granulocytes: 0.24 10*3/uL — ABNORMAL HIGH (ref 0.00–0.07)
Basophils Absolute: 0.1 10*3/uL (ref 0.0–0.1)
Basophils Relative: 0 %
Eosinophils Absolute: 0 10*3/uL (ref 0.0–0.5)
Eosinophils Relative: 0 %
HCT: 43.3 % (ref 39.0–52.0)
Hemoglobin: 14.3 g/dL (ref 13.0–17.0)
Immature Granulocytes: 2 %
Lymphocytes Relative: 6 %
Lymphs Abs: 1 10*3/uL (ref 0.7–4.0)
MCH: 28.6 pg (ref 26.0–34.0)
MCHC: 33 g/dL (ref 30.0–36.0)
MCV: 86.6 fL (ref 80.0–100.0)
Monocytes Absolute: 1.3 10*3/uL — ABNORMAL HIGH (ref 0.1–1.0)
Monocytes Relative: 8 %
Neutro Abs: 13.5 10*3/uL — ABNORMAL HIGH (ref 1.7–7.7)
Neutrophils Relative %: 84 %
Platelets: 195 10*3/uL (ref 150–400)
RBC: 5 MIL/uL (ref 4.22–5.81)
RDW: 13.8 % (ref 11.5–15.5)
WBC: 16.1 10*3/uL — ABNORMAL HIGH (ref 4.0–10.5)
nRBC: 0 % (ref 0.0–0.2)

## 2021-08-09 LAB — COMPREHENSIVE METABOLIC PANEL
ALT: 47 U/L — ABNORMAL HIGH (ref 0–44)
AST: 28 U/L (ref 15–41)
Albumin: 3.3 g/dL — ABNORMAL LOW (ref 3.5–5.0)
Alkaline Phosphatase: 101 U/L (ref 38–126)
Anion gap: 9 (ref 5–15)
BUN: 28 mg/dL — ABNORMAL HIGH (ref 8–23)
CO2: 24 mmol/L (ref 22–32)
Calcium: 8.1 mg/dL — ABNORMAL LOW (ref 8.9–10.3)
Chloride: 98 mmol/L (ref 98–111)
Creatinine, Ser: 1.06 mg/dL (ref 0.61–1.24)
GFR, Estimated: >60 mL/min (ref >60)
Glucose, Bld: 223 mg/dL — ABNORMAL HIGH (ref 70–99)
Potassium: 5 mmol/L (ref 3.5–5.1)
Sodium: 131 mmol/L — ABNORMAL LOW (ref 135–145)
Total Bilirubin: 2.1 mg/dL — ABNORMAL HIGH (ref 0.3–1.2)
Total Protein: 7 g/dL (ref 6.5–8.1)

## 2021-08-09 LAB — CBC
HCT: 42.3 % (ref 39.0–52.0)
Hemoglobin: 13.9 g/dL (ref 13.0–17.0)
MCH: 28.4 pg (ref 26.0–34.0)
MCHC: 32.9 g/dL (ref 30.0–36.0)
MCV: 86.5 fL (ref 80.0–100.0)
Platelets: 190 10*3/uL (ref 150–400)
RBC: 4.89 MIL/uL (ref 4.22–5.81)
RDW: 13.7 % (ref 11.5–15.5)
WBC: 13.4 10*3/uL — ABNORMAL HIGH (ref 4.0–10.5)
nRBC: 0 % (ref 0.0–0.2)

## 2021-08-09 LAB — PROCALCITONIN: Procalcitonin: <0.1 ng/mL

## 2021-08-09 LAB — BLOOD GAS, ARTERIAL
Acid-base deficit: 0.6 mmol/L (ref 0.0–2.0)
Acid-base deficit: 0.7 mmol/L (ref 0.0–2.0)
Acid-base deficit: 1.4 mmol/L (ref 0.0–2.0)
Bicarbonate: 24.5 mmol/L (ref 20.0–28.0)
Bicarbonate: 24.1 mmol/L (ref 20.0–28.0)
Bicarbonate: 23.3 mmol/L (ref 20.0–28.0)
Drawn by: 41977
Drawn by: 41977
Drawn by: 41977
FIO2: 48
FIO2: 100
FIO2: 100
O2 Saturation: 88.1 %
O2 Saturation: 98.1 %
O2 Saturation: 94.9 %
Patient temperature: 36.9
Patient temperature: 36.9
Patient temperature: 38
pCO2 arterial: 29.7 mmHg — ABNORMAL LOW (ref 32.0–48.0)
pCO2 arterial: 36.2 mmHg (ref 32.0–48.0)
pCO2 arterial: 40.2 mmHg (ref 32.0–48.0)
pH, Arterial: 7.489 — ABNORMAL HIGH (ref 7.350–7.450)
pH, Arterial: 7.421 (ref 7.350–7.450)
pH, Arterial: 7.378 (ref 7.350–7.450)
pO2, Arterial: 53.6 mmHg — ABNORMAL LOW (ref 83.0–108.0)
pO2, Arterial: 135 mmHg — ABNORMAL HIGH (ref 83.0–108.0)
pO2, Arterial: 89.6 mmHg (ref 83.0–108.0)

## 2021-08-09 LAB — BASIC METABOLIC PANEL
Anion gap: 7 (ref 5–15)
BUN: 28 mg/dL — ABNORMAL HIGH (ref 8–23)
CO2: 24 mmol/L (ref 22–32)
Calcium: 8.3 mg/dL — ABNORMAL LOW (ref 8.9–10.3)
Chloride: 100 mmol/L (ref 98–111)
Creatinine, Ser: 0.96 mg/dL (ref 0.61–1.24)
GFR, Estimated: >60 mL/min (ref >60)
Glucose, Bld: 149 mg/dL — ABNORMAL HIGH (ref 70–99)
Potassium: 4.6 mmol/L (ref 3.5–5.1)
Sodium: 131 mmol/L — ABNORMAL LOW (ref 135–145)

## 2021-08-09 LAB — LACTIC ACID, PLASMA
Lactic Acid, Venous: 2.9 mmol/L (ref 0.5–1.9)
Lactic Acid, Venous: 2.4 mmol/L (ref 0.5–1.9)

## 2021-08-09 LAB — APTT: aPTT: 24 seconds (ref 24–36)

## 2021-08-09 LAB — PROTIME-INR
INR: 1.5 — ABNORMAL HIGH (ref 0.8–1.2)
Prothrombin Time: 17.9 seconds — ABNORMAL HIGH (ref 11.4–15.2)

## 2021-08-09 LAB — GLUCOSE, CAPILLARY
Glucose-Capillary: 127 mg/dL — ABNORMAL HIGH (ref 70–99)
Glucose-Capillary: 135 mg/dL — ABNORMAL HIGH (ref 70–99)
Glucose-Capillary: 137 mg/dL — ABNORMAL HIGH (ref 70–99)
Glucose-Capillary: 247 mg/dL — ABNORMAL HIGH (ref 70–99)
Glucose-Capillary: 234 mg/dL — ABNORMAL HIGH (ref 70–99)

## 2021-08-09 LAB — BRAIN NATRIURETIC PEPTIDE: B Natriuretic Peptide: 78 pg/mL (ref 0.0–100.0)

## 2021-08-09 MED ORDER — FENTANYL 2500MCG IN NS 250ML (10MCG/ML) PREMIX INFUSION: 0.0000 ug/h | INTRAVENOUS | Status: DC

## 2021-08-09 MED ORDER — MIDAZOLAM HCL (PF) 5 MG/ML IJ SOLN
INTRAMUSCULAR | Status: DC | PRN
  Administered 2021-08-09 (×2): 5 mg via INTRAVENOUS

## 2021-08-09 MED ORDER — SUCCINYLCHOLINE CHLORIDE 200 MG/10ML IV SOSY
PREFILLED_SYRINGE | INTRAVENOUS | Status: DC | PRN
Start: 2021-08-09 — End: 2021-08-09
  Administered 2021-08-09: 200 mg via INTRAVENOUS

## 2021-08-09 MED ORDER — ETOMIDATE 2 MG/ML IV SOLN
INTRAVENOUS | Status: AC
  Filled 2021-08-09: qty 10

## 2021-08-09 MED ORDER — PHENYLEPHRINE HCL-NACL 20-0.9 MG/250ML-% IV SOLN
INTRAVENOUS | Status: AC
  Filled 2021-08-09: qty 250

## 2021-08-09 MED ORDER — ETOMIDATE 2 MG/ML IV SOLN
INTRAVENOUS | Status: DC | PRN
  Administered 2021-08-09: 16 mg via INTRAVENOUS

## 2021-08-09 MED ORDER — SODIUM CHLORIDE 0.9 % IV BOLUS
500.0000 mL | Freq: Once | INTRAVENOUS | Status: AC
  Administered 2021-08-09: 500 mL via INTRAVENOUS

## 2021-08-09 MED ORDER — ENOXAPARIN SODIUM 100 MG/ML IJ SOSY
95.0000 mg | PREFILLED_SYRINGE | Freq: Two times a day (BID) | INTRAMUSCULAR | Status: DC
  Administered 2021-08-10 – 2021-08-18 (×17): 95 mg via SUBCUTANEOUS
  Filled 2021-08-09 (×2): qty 0.95
  Filled 2021-08-09 (×2): qty 1
  Filled 2021-08-09: qty 0.95
  Filled 2021-08-09: qty 1
  Filled 2021-08-09: qty 0.95
  Filled 2021-08-09 (×2): qty 1
  Filled 2021-08-09 (×2): qty 0.95
  Filled 2021-08-09 (×2): qty 1
  Filled 2021-08-09: qty 0.95
  Filled 2021-08-09: qty 1
  Filled 2021-08-09 (×2): qty 0.95

## 2021-08-09 MED ORDER — LORAZEPAM 2 MG/ML IJ SOLN
INTRAMUSCULAR | Status: AC
  Administered 2021-08-09: 2 mg
  Filled 2021-08-09: qty 1

## 2021-08-09 MED ORDER — PANTOPRAZOLE SODIUM 40 MG IV SOLR
40.0000 mg | INTRAVENOUS | Status: DC
  Administered 2021-08-09 – 2021-08-19 (×11): 40 mg via INTRAVENOUS
  Filled 2021-08-09 (×11): qty 40

## 2021-08-09 MED ORDER — FUROSEMIDE 10 MG/ML IJ SOLN
40.0000 mg | Freq: Once | INTRAMUSCULAR | Status: AC
  Administered 2021-08-09: 40 mg via INTRAVENOUS
  Filled 2021-08-09: qty 4

## 2021-08-09 MED ORDER — METOPROLOL TARTRATE 5 MG/5ML IV SOLN
INTRAVENOUS | Status: AC
  Administered 2021-08-09: 5 mg
  Filled 2021-08-09: qty 5

## 2021-08-09 MED ORDER — PROPOFOL 10 MG/ML IV BOLUS
INTRAVENOUS | Status: AC
  Filled 2021-08-09: qty 20

## 2021-08-09 MED ORDER — LORAZEPAM 2 MG/ML IJ SOLN
1.0000 mg | Freq: Once | INTRAMUSCULAR | Status: AC
  Administered 2021-08-09: 1 mg via INTRAVENOUS

## 2021-08-09 MED ORDER — MIDAZOLAM HCL (PF) 10 MG/2ML IJ SOLN
INTRAMUSCULAR | Status: AC
  Filled 2021-08-09: qty 2

## 2021-08-09 MED ORDER — SUCCINYLCHOLINE CHLORIDE 200 MG/10ML IV SOSY
PREFILLED_SYRINGE | INTRAVENOUS | Status: AC
  Filled 2021-08-09: qty 10

## 2021-08-09 MED ORDER — OSELTAMIVIR PHOSPHATE 75 MG PO CAPS
75.0000 mg | ORAL_CAPSULE | Freq: Two times a day (BID) | ORAL | Status: AC
  Administered 2021-08-09 – 2021-08-11 (×4): 75 mg
  Filled 2021-08-09 (×3): qty 1

## 2021-08-09 MED ORDER — PHENYLEPHRINE HCL-NACL 20-0.9 MG/250ML-% IV SOLN
0.0000 ug/min | INTRAVENOUS | Status: AC
  Administered 2021-08-09 – 2021-08-10 (×2): 10 ug/min via INTRAVENOUS
  Filled 2021-08-09: qty 250

## 2021-08-09 MED ORDER — HALOPERIDOL LACTATE 5 MG/ML IJ SOLN
INTRAMUSCULAR | Status: AC
  Administered 2021-08-09: 5 mg
  Filled 2021-08-09: qty 1

## 2021-08-09 MED ORDER — PROPOFOL 1000 MG/100ML IV EMUL
INTRAVENOUS | Status: AC
  Filled 2021-08-09: qty 100

## 2021-08-09 MED ORDER — AMIODARONE HCL IN DEXTROSE 360-4.14 MG/200ML-% IV SOLN
60.0000 mg/h | INTRAVENOUS | Status: AC
  Administered 2021-08-09 (×2): 60 mg/h via INTRAVENOUS
  Filled 2021-08-09 (×2): qty 200

## 2021-08-09 MED ORDER — ENOXAPARIN SODIUM 100 MG/ML IJ SOSY: 95.0000 mg | PREFILLED_SYRINGE | Freq: Two times a day (BID) | INTRAMUSCULAR | Status: DC

## 2021-08-09 MED ORDER — PROPOFOL 1000 MG/100ML IV EMUL
5.0000 ug/kg/min | INTRAVENOUS | Status: DC
  Administered 2021-08-09: 70 ug/kg/min via INTRAVENOUS
  Administered 2021-08-09 (×3): 75 ug/kg/min via INTRAVENOUS
  Administered 2021-08-10: 55 ug/kg/min via INTRAVENOUS
  Administered 2021-08-10: 60 ug/kg/min via INTRAVENOUS
  Administered 2021-08-10: 40 ug/kg/min via INTRAVENOUS
  Filled 2021-08-09: qty 200
  Filled 2021-08-09 (×4): qty 100

## 2021-08-09 MED ORDER — HALOPERIDOL LACTATE 5 MG/ML IJ SOLN: 5.0000 mg | Freq: Once | INTRAMUSCULAR | Status: AC

## 2021-08-09 MED ORDER — DILTIAZEM HCL-DEXTROSE 125-5 MG/125ML-% IV SOLN (PREMIX): 5.0000 mg/h | INTRAVENOUS | Status: DC

## 2021-08-09 MED ORDER — SODIUM CHLORIDE 0.9 % IV SOLN: 2.0000 g | Freq: Once | INTRAVENOUS | Status: DC

## 2021-08-09 MED ORDER — AMIODARONE HCL IN DEXTROSE 360-4.14 MG/200ML-% IV SOLN
30.0000 mg/h | INTRAVENOUS | Status: DC
  Administered 2021-08-10 – 2021-08-20 (×21): 30 mg/h via INTRAVENOUS
  Filled 2021-08-09 (×22): qty 200

## 2021-08-09 MED ORDER — METHYLPREDNISOLONE SODIUM SUCC 125 MG IJ SOLR
80.0000 mg | Freq: Two times a day (BID) | INTRAMUSCULAR | Status: DC
  Administered 2021-08-09 – 2021-08-11 (×4): 80 mg via INTRAVENOUS
  Filled 2021-08-09 (×4): qty 2

## 2021-08-09 MED ORDER — CHLORHEXIDINE GLUCONATE CLOTH 2 % EX PADS
6.0000 | MEDICATED_PAD | Freq: Every day | CUTANEOUS | Status: DC
  Administered 2021-08-09 – 2021-08-25 (×18): 6 via TOPICAL

## 2021-08-09 MED ORDER — CHLORHEXIDINE GLUCONATE 0.12% ORAL RINSE (MEDLINE KIT)
15.0000 mL | Freq: Two times a day (BID) | OROMUCOSAL | Status: DC
  Administered 2021-08-09 – 2021-08-20 (×22): 15 mL via OROMUCOSAL

## 2021-08-09 MED ORDER — LORAZEPAM 2 MG/ML IJ SOLN
1.0000 mg | Freq: Once | INTRAMUSCULAR | Status: AC
  Administered 2021-08-09: 1 mg via INTRAVENOUS
  Filled 2021-08-09: qty 1

## 2021-08-09 MED ORDER — ALPRAZOLAM 0.5 MG PO TABS
1.0000 mg | ORAL_TABLET | Freq: Three times a day (TID) | ORAL | Status: DC | PRN
  Administered 2021-08-09: 1 mg via ORAL
  Filled 2021-08-09: qty 1

## 2021-08-09 MED ORDER — FENTANYL 2500MCG IN NS 250ML (10MCG/ML) PREMIX INFUSION
0.0000 ug/h | INTRAVENOUS | Status: AC
  Administered 2021-08-09: 25 ug/h via INTRAVENOUS
  Administered 2021-08-11: 150 ug/h via INTRAVENOUS
  Administered 2021-08-12: 100 ug/h via INTRAVENOUS
  Filled 2021-08-09 (×2): qty 250

## 2021-08-09 MED ORDER — ENOXAPARIN SODIUM 100 MG/ML IJ SOSY
95.0000 mg | PREFILLED_SYRINGE | Freq: Two times a day (BID) | INTRAMUSCULAR | Status: DC
Start: 2021-08-09 — End: 2021-08-09

## 2021-08-09 MED ORDER — ORAL CARE MOUTH RINSE
15.0000 mL | OROMUCOSAL | Status: DC
  Administered 2021-08-09 – 2021-08-20 (×103): 15 mL via OROMUCOSAL

## 2021-08-09 MED ORDER — PHENYLEPHRINE HCL-NACL 20-0.9 MG/250ML-% IV SOLN: 0.0000 ug/min | INTRAVENOUS | Status: DC

## 2021-08-09 MED ORDER — METOPROLOL TARTRATE 5 MG/5ML IV SOLN: 5.0000 mg | Freq: Once | INTRAVENOUS | Status: AC

## 2021-08-09 MED ORDER — SODIUM CHLORIDE 0.9 % IV SOLN
2.0000 g | Freq: Three times a day (TID) | INTRAVENOUS | Status: AC
  Administered 2021-08-09 – 2021-08-16 (×21): 2 g via INTRAVENOUS
  Filled 2021-08-09 (×21): qty 2

## 2021-08-09 MED ORDER — LACTATED RINGERS IV BOLUS (SEPSIS)
1000.0000 mL | Freq: Once | INTRAVENOUS | Status: AC
  Administered 2021-08-09: 1000 mL via INTRAVENOUS

## 2021-08-09 MED ORDER — INSULIN ASPART 100 UNIT/ML IJ SOLN
0.0000 [IU] | INTRAMUSCULAR | Status: DC
  Administered 2021-08-09 – 2021-08-10 (×3): 5 [IU] via SUBCUTANEOUS
  Administered 2021-08-10 (×2): 3 [IU] via SUBCUTANEOUS
  Administered 2021-08-10 (×3): 5 [IU] via SUBCUTANEOUS
  Administered 2021-08-11: 09:00:00 3 [IU] via SUBCUTANEOUS
  Administered 2021-08-11 (×4): 5 [IU] via SUBCUTANEOUS
  Administered 2021-08-11: 12:00:00 3 [IU] via SUBCUTANEOUS
  Administered 2021-08-11: 5 [IU] via SUBCUTANEOUS
  Administered 2021-08-12: 11 [IU] via SUBCUTANEOUS
  Administered 2021-08-12: 8 [IU] via SUBCUTANEOUS
  Administered 2021-08-12: 5 [IU] via SUBCUTANEOUS
  Administered 2021-08-12 – 2021-08-13 (×6): 8 [IU] via SUBCUTANEOUS
  Administered 2021-08-13: 5 [IU] via SUBCUTANEOUS
  Administered 2021-08-13: 8 [IU] via SUBCUTANEOUS
  Administered 2021-08-13: 5 [IU] via SUBCUTANEOUS
  Administered 2021-08-14: 11 [IU] via SUBCUTANEOUS

## 2021-08-09 MED ORDER — PROPOFOL 10 MG/ML IV BOLUS
5.0000 mg | Freq: Once | INTRAVENOUS | Status: AC
  Administered 2021-08-09: 5 mg via INTRAVENOUS

## 2021-08-09 MED ORDER — LACTATED RINGERS IV SOLN: INTRAVENOUS | Status: DC

## 2021-08-09 MED ORDER — MIDAZOLAM-SODIUM CHLORIDE 100-0.9 MG/100ML-% IV SOLN: 1.0000 mg/h | INTRAVENOUS | Status: DC

## 2021-08-09 NOTE — Progress Notes (Signed)
[facility]  Cxr with cvl in good position and no ptx. Can use. Official read pending.  Curlene Labrum, MD [facility] [address] [place], [place] [address] [phone] (office)

## 2021-08-09 NOTE — Progress Notes (Addendum)
Rapid response: Patient was seen and examined multiple times throughout the day including now in ICU bed 6.  Patient was consistently tachypneic, tachycardic, hypoxic Agitated, confused, HEENT-within normal limits Lungs :poor air exchange mid to lower lobes,  diffuse rhonchi, no wheezing Abdomen soft, nontender Moving all 4 extremities, lower extremity cold, positive pulses  Earlier this morning patient was given 1 mg of Ativan, was placed on 7 L of oxygen  Patient continued to have altered mental status, with agitation, hypoxia.  Subsequently patient was transferred to ICU where he became agitated, confused Taken off the lines, and a mask where he was satting in 58s With a heart rate around 140  1 mg of Ativan and 5 mg of Haldol was given.... Patient calmed down a bit but continue to be in respiratory distress, gasping for breath O2 saturations slowly improved.  PCCM Dr. Leonides Schanz and cardiology Dr. Domenic Polite consulted as patient remains in respiratory failure, and A. fib with RVR.  As patient continued to decline decision was made to secure the airway and sedate the patient.    Family including his wife son and daughters were present at bedside... They agreed with the plan.   Anesthesia team was called, patient was successfully intubated, sedation was initiated.  Blood pressure 125/84, pulse (!) 121, temperature (!) 101.3 F (38.5 C), temperature source Axillary, resp. rate (!) 36, height 5\' 8"  (1.727 m), weight 94 kg, SpO2 98 %.  Patient has subsequently been stabilized: On event, continue with sedation medication, repeating ABGs, labs, Cardiology recommended amiodarone drip. Increasing Solu-Medrol 80 twice daily. Medical management per Dr. Leonides Schanz.   SIGNED: Deatra James, MD, FHM. [facility],  Pager (please use [email or web address] to page/text)  Total of 75 min of critical time was spent in evaluating the patient, stabilizing the patient, discussing plan of care with consultants  PCCM, cardiology, anesthesiology .Family discussion and Obtaining consent  please use Epic Secure Chat for non-urgent communication (7AM-7PM) If 7PM-7AM, please contact night-coverage [email or web address],  08/09/2021, 4:42 PM

## 2021-08-09 NOTE — Progress Notes (Signed)
Pt administered Lasix per order, has already urinated 133ml via pure wick. Pt alternates between resting with eyes closed and restlessness with pulling at cpcp mask and tele box. Pt alert but oriented to person and place only. Resp rate remains 38-40/min with grunting and occasional congested cough. CPAP on with O2 @b  9lpm. Breath sounds diminished bilaterally. HR remains 120's. Wife and daughter at bedside, aware of pending transfer to ICU and rationale.

## 2021-08-09 NOTE — Progress Notes (Signed)
Pt transported to room ICU 06 via bed on 9 lpm HFNC. Pt became diaphoretic with increased restlessness during transport off CPAP. Bedside update given to ICU staff. Pt's wife and daughter instructed to wait in ICU waiting room and ICU staff will notify them when they can come in. Both stated understanding.

## 2021-08-09 NOTE — Progress Notes (Signed)
eLink Physician-Brief Progress Note Patient Name: Shaun [name] DOB: March 25, 1956 MRN: [id number]   Date of Service  08/09/2021  HPI/Events of Note  Patient with Influenza A pneumonia, acute respiratory failure, on the ventilator. Patient is hypotensive on Propofol, also has atrial fibrillation with RVR (rate 120's).  eICU Interventions  Phenylephrine gtt ordered, Fentanyl gtt ordered to serve as a Propofol sparing second sedation agent, echocardiogram ordered as patient has not had an echocardiogram in a few years.        Kerry Kass Chrissi Crow 08/09/2021, 7:45 PM

## 2021-08-09 NOTE — Progress Notes (Signed)
Anesthesia was called to intubate the patient. Patient has influenza A infection, tachypnea on bipap. Sedation was given intubated with glidescope, atraumatic, CXR reviewed. Endorsed to ICU team.

## 2021-08-09 NOTE — Progress Notes (Signed)
Pharmacy Antibiotic Note  Shaun [name] is a 65 y.o. male admitted on 08/06/2021 with pneumonia.  Pharmacy has been consulted for cefepime dosing.  Plan: Cefepime 2000 mg IV every 8 hours. Monitor labs, c/s, and patient improvement.  Height: 5\' 8"  (172.7 cm) Weight: 94 kg (207 lb 3.7 oz) IBW/kg (Calculated) : 68.4  Temp (24hrs), Avg:99 F (37.2 C), Min:98 F (36.7 C), Max:101.3 F (38.5 C)  Recent Labs  Lab 08/06/21 1908 08/07/21 0556 08/08/21 0423 08/09/21 0429 08/09/21 1623  WBC 10.3  --  14.1* 13.4* 16.1*  CREATININE 1.43* 1.08 0.93 0.96  --   LATICACIDVEN  --   --   --   --  2.9*    Estimated Creatinine Clearance: 85.3 mL/min (by C-G formula based on SCr of 0.96 mg/dL).    No Known Allergies  Antimicrobials this admission: Cefepime 12/12 >>  Microbiology results: 12/12 BCx: pending  12/12 Sputum: pending  12/12 MRSA PCR: pending  Thank you for allowing pharmacy to be a part of this patient's care.  Ramond Craver 08/09/2021 5:03 PM

## 2021-08-09 NOTE — Consult Note (Signed)
NAME:  Shaun [name], MRN:  [id number], DOB:  1956/07/10, LOS: 3 ADMISSION DATE:  08/06/2021, CONSULTATION DATE:  12/12 REFERRING MD:  Richarda Blade, CHIEF COMPLAINT:  resp distress    History of Present Illness:     17 yowm active smoker with nl pfts 06/2019 with history of anxiety, ASCVD, CHF,  diabetes mellitus type 2, hyperlipidemia, hypertension, obstructive sleep apnea, paroxysmal atrial fibrillation, and more presented to ED with chief complaint of dyspnea.  Patient reports he has had fever and dyspnea for 1 week PTA. He reports that he was diagnosed with the flu 6 days PTA.  He was given a steroid and Tessalon Perles per his report.   He had a T-max at home of 102.    Patient does admit to orthopnea but no increase in peripheral edema.  He has a cough but is only occasionally productive with green sputum.  No hemoptysis.  He does have some chest tightness that is been going on for the whole week.  It is worse with exertion, better with rest.  Worse lying  flat as well.  Patient reports feeling lightheaded when he has the chest tightness.    He does not wear oxygen at home.    He does sleep with a CPAP at home.  Patient does report decreased p.o. intake.  Yesterday all he had all day was chicken soup.  Last bowel movement was yesterday.      The ED Temp 99.7, heart rate 101-125, respiratory rate 16-25, blood pressure 125/88, satting 96% on 3 L nasal cannula  Was improving but rapidly worse sob / desats 12/12 requiring bipap and then intubation by anesthesia         Pertinent  Medical History  CAF - no recent echo, lat one 03/2019  MOd LAE with elevated LVEDP HBP DM OSA on CPAP    Significant Hospital Events: Including procedures, antibiotic start and stop dates in addition to other pertinent events   Oral ET  12/12    Scheduled Meds:  apixaban  5 mg Oral BID   ARIPiprazole  2 mg Oral Daily   atorvastatin  80 mg Oral Daily   buPROPion  150 mg Oral TID WC   Chlorhexidine  Gluconate Cloth  6 each Topical Daily   COVID-19 mRNA bivalent vaccine (Pfizer)  0.3 mL Intramuscular ONCE-1600   escitalopram  20 mg Oral Daily   ezetimibe  10 mg Oral Daily   furosemide  40 mg Oral Daily   guaiFENesin-dextromethorphan  10 mL Oral Q8H   haloperidol lactate       haloperidol lactate  5 mg Intravenous Once   insulin aspart  0-15 Units Subcutaneous TID WC   insulin aspart  0-5 Units Subcutaneous QHS   ipratropium  0.5 mg Nebulization BID   levalbuterol  1.25 mg Nebulization BID   LORazepam       methylPREDNISolone (SOLU-MEDROL) injection  40 mg Intravenous Q12H   metoprolol tartrate       metoprolol tartrate  5 mg Intravenous Once   metoprolol tartrate  100 mg Oral BID   oseltamivir  75 mg Oral BID   rOPINIRole  1 mg Oral QHS   traZODone  100 mg Oral QHS   Continuous Infusions:  diltiazem (CARDIZEM) infusion     PRN Meds:.acetaminophen **OR** acetaminophen, ALPRAZolam, benzonatate, levalbuterol, meclizine, ondansetron **OR** ondansetron (ZOFRAN) IV, oxyCODONE    Interim History / Subjective:  Resp distress / being intubated by anesthesia on my arrival   Objective  Blood pressure 125/84, pulse (!) 116, temperature 98.4 F (36.9 C), temperature source Oral, resp. rate (!) 28, height 5\' 8"  (1.727 m), weight 94 kg, SpO2 (!) 87 %.        Intake/Output Summary (Last 24 hours) at 08/09/2021 1542 Last data filed at 08/08/2021 1700 Gross per 24 hour  Intake 240 ml  Output 300 ml  Net -60 ml   Filed Weights   08/08/21 0526 08/09/21 0439 08/09/21 1456  Weight: 94.6 kg 95.3 kg 94 kg    Examination: Tmax 99  General appearance:    elderly wm resp ditress   No jvd Oropharynx  et  Neck supple Lungs with   min exp > insp rhonchi bilaterally IRIR at 140   Abd obese with poor  excursion  Extr warm with no edema or clubbing noted     I personally reviewed images and agree with radiology impression as follows:  CXR:   12/12 pre ET portable  Probable fluid  overload/low level congestive heart failure. Cardiomegaly, venous hypertension and mild interstitial edema.  Question also bronchial thickening that could go along with bronchitis.      Assessment & Plan:  1) Acute resp distress in pt with Influenza A infection/? Pna and component of acute on chronic chf with diastolic dysfunction likely esp in RAF  >>> intubation/ sedation and mech vent overnight   2) Prob underlying asthma in this smoker but no evidence of COPD      Best Practice (right click and "Reselect all SmartList Selections" daily)   Diet/type: NPO DVT prophylaxis: LMWH GI prophylaxis: PPI Lines: N/A Foley:  Yes, and it is still needed Code Status:  full code Last date of multidisciplinary goals of care discussion  per [facility]   Labs   CBC: Recent Labs  Lab 08/06/21 1908 08/08/21 0423 08/09/21 0429  WBC 10.3 14.1* 13.4*  HGB 15.0 14.7 13.9  HCT 44.7 45.2 42.3  MCV 86.5 86.9 86.5  PLT 141* 187 694    Basic Metabolic Panel: Recent Labs  Lab 08/06/21 1908 08/07/21 0556 08/08/21 0423 08/09/21 0429  NA 132* 134* 136 131*  K 3.9 5.0 4.8 4.6  CL 102 102 100 100  CO2 21* 26 27 24   GLUCOSE 158* 194* 152* 149*  BUN 24* 24* 28* 28*  CREATININE 1.43* 1.08 0.93 0.96  CALCIUM 8.1* 8.6* 9.0 8.3*  MG  --  2.3  --   --    GFR: Estimated Creatinine Clearance: 85.3 mL/min (by C-G formula based on SCr of 0.96 mg/dL). Recent Labs  Lab 08/06/21 1908 08/08/21 0423 08/09/21 0429  WBC 10.3 14.1* 13.4*    Liver Function Tests: Recent Labs  Lab 08/06/21 1908 08/07/21 0556  AST 36 24  ALT 53* 50*  ALKPHOS 96 96  BILITOT 0.7 1.0  PROT 6.8 6.9  ALBUMIN 3.5 3.4*   No results for input(s): LIPASE, AMYLASE in the last 168 hours. No results for input(s): AMMONIA in the last 168 hours.  ABG    Component Value Date/Time   PHART 7.489 (H) 08/09/2021 1259   PCO2ART 29.7 (L) 08/09/2021 1259   PO2ART 53.6 (L) 08/09/2021 1259   HCO3 24.5 08/09/2021 1259   TCO2  25 10/18/2016 1729   ACIDBASEDEF 0.6 08/09/2021 1259   O2SAT 88.1 08/09/2021 1259     Coagulation Profile: Recent Labs  Lab 08/06/21 1908  INR 1.0    Cardiac Enzymes: No results for input(s): CKTOTAL, CKMB, CKMBINDEX, TROPONINI in the last 168 hours.  HbA1C: Hgb A1c MFr Bld  Date/Time Value Ref Range Status  08/07/2021 05:56 AM 6.9 (H) 4.8 - 5.6 % Final    Comment:    (NOTE) Pre diabetes:          5.7%-6.4%  Diabetes:              >6.4%  Glycemic control for   <7.0% adults with diabetes   02/10/2021 08:53 AM 6.8 (H) 4.6 - 6.5 % Final    Comment:    Glycemic Control Guidelines for People with Diabetes:Non Diabetic:  <6%Goal of Therapy: <7%Additional Action Suggested:  >8%     CBG: Recent Labs  Lab 08/08/21 1121 08/08/21 1623 08/08/21 2102 08/09/21 0802 08/09/21 1139  GLUCAP 269* 153* 154* 135* 137*      Past Medical History:  He,  has a past medical history of Anemia, Anxiety, Arteriosclerotic cardiovascular disease (ASCVD), Asthma, Cancer ([place]), Cataract, CHF (congestive heart failure) ([place]), Colon polyps, COPD (chronic obstructive pulmonary disease) ([place]), Diabetes mellitus without complication ([place]), Diverticulitis, DJD (degenerative joint disease), GERD (gastroesophageal reflux disease), History of kidney stones, Hyperlipidemia, Hypertension, Insomnia, Obstructive sleep apnea (12/2009), Paroxysmal atrial fibrillation ([place]) (10/2004), PUD (peptic ulcer disease), RLS (restless legs syndrome), and Sinusitis.   Surgical History:   Past Surgical History:  Procedure Laterality Date   BIOPSY  07/17/2018   Procedure: BIOPSY;  Surgeon: Danie Binder, MD;  [facility];  Service: Endoscopy;;  colon   BOWEL RESECTION  09/17/2018   SMALL BOWEL RESECTION: 71 CM    CARDIAC CATHETERIZATION N/A 07/21/2015   Procedure: Left Heart Cath and Coronary Angiography;  Surgeon: Peter M Martinique, MD;  Location: [facility];  Service: Cardiovascular;  Laterality: N/A;    CARDIAC CATHETERIZATION N/A 07/21/2015   Procedure: Coronary Stent Intervention;  Surgeon: Peter M Martinique, MD;  Location: [facility];  Service: Cardiovascular;  Laterality: N/A;   CIRCUMCISION N/A 04/05/2019   Procedure: CIRCUMCISION ADULT;  Surgeon: Irine Seal, MD;  Location: [facility];  Service: Urology;  Laterality: N/A;   COLONOSCOPY N/A 05/19/2014   Dr. Barnie Alderman diverticulosis/moderate external hemorrhoids, >20 simple adenomas. Genetic screening negative.    COLONOSCOPY WITH PROPOFOL N/A 07/17/2018   Dr. Oneida Alar: Diverticulosis, external/internal hemorrhoids, 32 colon polyps removed.  ten tubular adenomas removed with no high-grade dysplasia.  Advised to have surveillance colonoscopy in 3 years.   COLONOSCOPY WITH PROPOFOL N/A 06/07/2021   Procedure: COLONOSCOPY WITH PROPOFOL;  Surgeon: Eloise Harman, DO;  [facility];  Service: Endoscopy;  Laterality: N/A;  9:30 / ASA 3  (Pt was told that his time will be given at Pre-op)   ESOPHAGOGASTRODUODENOSCOPY (EGD) WITH PROPOFOL N/A 07/17/2018   Dr. Oneida Alar: Low-grade narrowing Schatzki ring at the GE junction status post dilation.  Gastritis.  Biopsy with mild nonspecific reactive gastropathy.  No H. pylori.   GIVENS CAPSULE STUDY N/A 06/24/2019   normal   HERNIA REPAIR  1986   Left inguinal   INTRAVASCULAR PRESSURE WIRE/FFR STUDY Left 06/08/2017   Procedure: INTRAVASCULAR PRESSURE WIRE/FFR STUDY;  Surgeon: Nelva Bush, MD;  Location: [facility];  Service: Cardiovascular;  Laterality: Left;  LAD and CFX   LAPAROTOMY N/A 09/17/2018   Procedure: EXPLORATORY LAPAROTOMY;  Surgeon: Virl Cagey, MD;  Location: [facility];  Service: General;  Laterality: N/A;   LEFT HEART CATH AND CORONARY ANGIOGRAPHY N/A 06/08/2017   Procedure: LEFT HEART CATH AND CORONARY ANGIOGRAPHY;  Surgeon: Nelva Bush, MD;  Location: [facility];  Service: Cardiovascular;  Laterality: N/A;   POLYPECTOMY  07/17/2018   Procedure:  POLYPECTOMY;  Surgeon: Danie Binder, MD;  [facility];  Service: Endoscopy;;  colon   POLYPECTOMY  06/07/2021   Procedure: POLYPECTOMY INTESTINAL;  Surgeon: Eloise Harman, DO;  [facility];  Service: Endoscopy;;   ROTATOR CUFF REPAIR     Right   SAVORY DILATION N/A 07/17/2018   Procedure: SAVORY DILATION;  Surgeon: Danie Binder, MD;  [facility];  Service: Endoscopy;  Laterality: N/A;     Social History:   reports that he has been smoking cigarettes. He started smoking about 51 years ago. He has a 19.50 pack-year smoking history. He has never used smokeless tobacco. He reports that he does not drink alcohol and does not use drugs.   Family History:  His family history includes Brain cancer in his maternal uncle; Breast cancer in his cousin; Breast cancer (age of onset: 64) in his mother; Cancer in his cousin and maternal uncle; Diabetes in his brother; Heart attack in his father; Hypertension in his mother; Parkinson's disease in his brother; Skin cancer (age of onset: 77) in his sister. There is no history of Colon cancer.   Allergies No Known Allergies   Home Medications  Prior to Admission medications   Medication Sig Start Date End Date Taking? Authorizing Provider  albuterol (VENTOLIN HFA) 108 (90 Base) MCG/ACT inhaler INHALE 2 PUFFS INTO THE LUNGS EVERY 6 HOURS AS NEEDED FOR WHEEZING OR SHORTNESS OF BREATH 08/01/21  Yes Hawks, Christy A, FNP  apixaban (ELIQUIS) 5 MG TABS tablet TAKE 1 TABLET(5 MG) BY MOUTH TWICE DAILY 05/05/21  Yes Lelon Perla, MD  ARIPiprazole (ABILIFY) 2 MG tablet Take 1 tablet (2 mg total) by mouth daily. 05/11/21  Yes Maximiano Coss, NP  atorvastatin (LIPITOR) 80 MG tablet Take 1 tablet (80 mg total) by mouth daily. TAKE 1 TABLET(80 MG) BY MOUTH EVERY EVENING 05/04/21  Yes Loel Dubonnet, NP  benzonatate (TESSALON PERLES) 100 MG capsule Take 1 capsule (100 mg total) by mouth 3 (three) times daily as needed. 08/01/21   Yes Hawks, Christy A, FNP  buPROPion (WELLBUTRIN XL) 150 MG 24 hr tablet Take 3 tablets once daily Patient taking differently: Take 150 mg by mouth 3 (three) times daily with meals. 02/10/21  Yes Maximiano Coss, NP  docusate sodium (COLACE) 100 MG capsule Take 100 mg by mouth daily as needed for mild constipation.   Yes [provider]  escitalopram (LEXAPRO) 20 MG tablet TAKE 1 TABLET(20 MG) BY MOUTH DAILY 03/13/20  Yes Lovena Le, Malena M, DO  ezetimibe (ZETIA) 10 MG tablet TAKE 1 TABLET BY MOUTH EVERY DAY 07/21/21 07/21/22 Yes Lelon Perla, MD  ferrous sulfate 325 (65 FE) MG tablet Take 325 mg by mouth daily with breakfast.   Yes [provider]  furosemide (LASIX) 40 MG tablet Take 1 tablet (40 mg total) by mouth daily. 05/04/21 04/29/22 Yes Loel Dubonnet, NP  lisinopril (ZESTRIL) 2.5 MG tablet TAKE 1 TABLET(2.5 MG) BY MOUTH DAILY. 02/04/21 02/04/22 Yes Crenshaw, Denice Bors, MD  meclizine (ANTIVERT) 25 MG tablet Take 1 tablet (25 mg total) by mouth 3 (three) times daily as needed for dizziness. 10/18/16  Yes Daleen Bo, MD  metFORMIN (GLUCOPHAGE) 500 MG tablet TAKE ONE TABLET (500 MG) BY MOUTH TWICE DAILY WITH A MEAL. 06/03/21 06/03/22 Yes Maximiano Coss, NP  metoprolol tartrate (LOPRESSOR) 100 MG tablet Take 1 tablet (100 mg  total) by mouth 2 (two) times daily. 07/29/21  Yes Maximiano Coss, NP  nitroGLYCERIN (NITROSTAT) 0.4 MG SL tablet Place 1 tablet (0.4 mg total) under the tongue every 5 (five) minutes as needed. 06/30/15  Yes Lendon Colonel, NP  omeprazole (PRILOSEC) 40 MG capsule Take 1 capsule by mouth once daily 30 minutes before breakfast 12/31/20  Yes Annitta Needs, NP  ondansetron (ZOFRAN) 4 MG tablet Take 1 tablet (4 mg total) by mouth every 8 (eight) hours as needed for nausea or vomiting. 12/31/19  Yes Annitta Needs, NP  potassium chloride SA (KLOR-CON) 20 MEQ tablet Take 1 tablet (20 mEq total) by mouth daily. 04/01/21  Yes Lelon Perla, MD  rOPINIRole (REQUIP) 3  MG tablet TAKE 1 TABLET BY MOUTH EVERY NIGHT AT BEDTIME 03/17/21 03/17/22 Yes Maximiano Coss, NP  sildenafil (VIAGRA) 100 MG tablet TAKE 1 TABLET BY MOUTH 30 MINUTES BEFORE ACTIVITY 07/30/21  Yes Lelon Perla, MD  sucralfate (CARAFATE) 1 g tablet Take one tablet po BID PRN Patient taking differently: Take 1 g by mouth 2 (two) times daily as needed (acid reflux). 01/08/20  Yes Mikey Kirschner, MD  traMADol (ULTRAM) 50 MG tablet Take 1 tablet (50 mg total) by mouth twice daily as needed for moderate pain 05/20/21  Yes Kirsteins, Luanna Salk, MD  traZODone (DESYREL) 50 MG tablet TAKE 2 TABLETS (100 MG) BY MOUTH AT BEDTIME 02/09/21 02/09/22 Yes Maximiano Coss, NP  pseudoephedrine (SUDAFED) 30 MG tablet Take 30 mg by mouth every 6 (six) hours as needed for congestion. Patient not taking: Reported on 08/06/2021    [provider]      The patient is critically ill with multiple organ systems failure and requires high complexity decision making for assessment and support, frequent evaluation and titration of therapies, application of advanced monitoring technologies and extensive interpretation of multiple databases. Critical Care Time devoted to patient care services described in this note is 40  minutes.   Christinia Gully, MD [facility] [place] [phone]   After 7:00 pm call Elink  [phone]

## 2021-08-09 NOTE — Progress Notes (Signed)
PROGRESS NOTE    Patient: Shaun [name]                            PCP: Maximiano Coss, NP                    DOB: 12-Apr-1956            DOA: 08/06/2021 [id number]             DOS: 08/09/2021, 10:15 AM   LOS: 3 days   Date of Service: The patient was seen and examined on 08/09/2021  Subjective:   The patient was seen and examined this morning, still complaining of cough, shortness of breath with exertion Down from 5 L to 3 L of oxygen via nasal cannula, satting 97% Hemodynamically stable afebrile normotensive   Brief Narrative:   Shaun [name]  is a 65 y.o. male, with history of anxiety, ASCVD, CHF, COPD, diabetes mellitus type 2, hyperlipidemia, hypertension, obstructive sleep apnea, paroxysmal atrial fibrillation, and more presents ED with chief complaint of dyspnea.  Patient reports he has had fever and dyspnea for 1 week.  He reports that he was diagnosed with the flu 6 days ago.  He was given a steroid and Tessalon Perles per his report.  He reports that it was not helpful.  He had a T-max at home of 102.  He tried Tylenol and thinks that he had temporary relief.  He has a cough but is only occasionally productive with green sputum.   He does not wear oxygen at home.  He does sleep with a CPAP at home.  Pa   Patient smokes half a pack a day but declines a nicotine patch as he has not had a cigarette since he has been ill.   Patient is vaccinated for COVID but not flu.  Patient is full code.  ED Temp 99.7, heart rate 101-125, respiratory rate 16-25, blood pressure 125/88, satting 96% on 3 L nasal cannula No leukocytosis with white blood cell count 10.3, hemoglobin 15 Chemistry panel reveals a decreased bicarb at 21 and an AKI with a creatinine of 1.43 Patient is fluid positive Chest x-ray shows no active disease    Assessment & Plan:   Principal Problem:   Acute respiratory failure with hypoxia (HCC) Active Problems:   Chronic atrial fibrillation (HCC)    Tobacco abuse   Obstructive sleep apnea   Essential hypertension   Non-insulin treated type 2 diabetes mellitus (HCC)   COPD (chronic obstructive pulmonary disease) (HCC)   Chronic diastolic CHF (congestive heart failure) (HCC)  Principal Problem:    Acute respiratory failure with hypoxia (HCC)-due to influenza A  --Still requiring supplemental oxygen, down from 5 L to 3 L of oxygen, satting 97% Still complaining of cough congestion, but some improvement  -Continue supportive care, pulmonary toiletry  -Chest x-ray clear no signs of pneumonia, WBC 10.3 >> 13.4 -ABG 7.35/48.5/<31/23.1 -Continue aggressive treatment with IV steroids, DuoNeb bronchodilators, mucolytic, -Although patient has symptoms > 6 days still symptomatic with low-grade fever initiating Tamiflu for 5 days -Symptom may be superimposed by COPD, CHF, OSA,--although no signs of exacerbation (BNP 75, -We will monitor closely -Continue Solu-Medrol 40 mg twice daily   Acute renal insufficiency -Resolved -Baseline creatinine around 0.89, Cr. On admission>> 1.43 >>> 1.08 >> 0.93, 0.96 -Holding ACE inhibitor's and Lasix -We will try to avoid nephrotoxins -Monitor kidney function closely   Active Problems:  Chronic  atrial fibrillation (HCC) -Remained stable, continue home medication Lopressor and Eliquis  Tobacco abuse -advise regarding smoking cessation, declined NicoDerm patch  Obstructive sleep apnea -currently on 3 L of oxygen, CPAP nightly     Essential hypertension -stable,  resuming home medication    Non-insulin treated type 2 diabetes mellitus (HCC) -CBG QA CHS, SSI coverage    COPD (chronic obstructive pulmonary disease) ([place]) -management as above   Chronic diastolic CHF (congestive heart failure) (HCC) -monitoring daily weights, I's and O's, resuming home dose  Lasix   ------------------------------------------------------------------------------------------------------------------------------------------ Cultures; Blood Cultures x 2 >> NGT   Antimicrobials: Tamiflu   Consultants: None  -------------------------------------------------------------------------------------------------------------------------------------  DVT prophylaxis:  SCD/Compression stockings and PP:IRJJOAC Code Status:   Code Status: Full Code  Family Communication: Wife present at bedside updated The above findings and plan of care has been discussed with patient (and family)  in detail,  they expressed understanding and agreement of above. -Advance care planning has been discussed.   Admission status:   Status is: Inpatient  Remains inpatient appropriate because: As patient in respiratory failure requiring supplemental oxygen 5 L hypoxic... Needing aggressive treatment for underlying influenza A treatment, supportive therapy      Level of care: Telemetry   Procedures:   No admission procedures for hospital encounter.    Antimicrobials:  Anti-infectives (From admission, onward)    Start     Dose/Rate Route Frequency Ordered Stop   08/07/21 0800  oseltamivir (TAMIFLU) capsule 75 mg        75 mg Oral 2 times daily 08/07/21 0711 08/12/21 0959        Medication:   apixaban  5 mg Oral BID   ARIPiprazole  2 mg Oral Daily   atorvastatin  80 mg Oral Daily   buPROPion  150 mg Oral TID WC   COVID-19 mRNA bivalent vaccine (Pfizer)  0.3 mL Intramuscular ONCE-1600   escitalopram  20 mg Oral Daily   ezetimibe  10 mg Oral Daily   furosemide  40 mg Oral Daily   guaiFENesin-dextromethorphan  10 mL Oral Q8H   insulin aspart  0-15 Units Subcutaneous TID WC   insulin aspart  0-5 Units Subcutaneous QHS   ipratropium  0.5 mg Nebulization BID   levalbuterol  1.25 mg Nebulization BID   methylPREDNISolone (SOLU-MEDROL) injection  40 mg Intravenous Q12H    metoprolol tartrate  100 mg Oral BID   oseltamivir  75 mg Oral BID   rOPINIRole  1 mg Oral QHS   traZODone  100 mg Oral QHS    acetaminophen **OR** acetaminophen, benzonatate, levalbuterol, meclizine, ondansetron **OR** ondansetron (ZOFRAN) IV, oxyCODONE   Objective:   Vitals:   08/08/21 2229 08/09/21 0420 08/09/21 0439 08/09/21 0727  BP:  115/80    Pulse: 98 87    Resp: 17 20    Temp:  98 F (36.7 C)    TempSrc:      SpO2: 93% 92%  97%  Weight:   95.3 kg   Height:        Intake/Output Summary (Last 24 hours) at 08/09/2021 1015 Last data filed at 08/08/2021 1700 Gross per 24 hour  Intake 480 ml  Output 1450 ml  Net -970 ml   Filed Weights   08/07/21 0903 08/08/21 0526 08/09/21 0439  Weight: 94.4 kg 94.6 kg 95.3 kg     Examination:     Physical Exam:   General:  Alert, oriented, cooperative, no distress; cough, congestion Shortness of breath, on 3 L of oxygen  HEENT:  Normocephalic, PERRL, otherwise with in Normal limits   Neuro:  CNII-XII intact. , normal motor and sensation, reflexes intact   Lungs:   Diffuse rhonchi no wheezes / crackles  Cardio:    S1/S2, RRR, No murmure, No Rubs or Gallops   Abdomen:   Soft, non-tender, bowel sounds active all four quadrants,  no guarding or peritoneal signs.  Muscular skeletal:  Limited exam - in bed, able to move all 4 extremities, global generalized weakness 2+ pulses,  symmetric, No pitting edema  Skin:  Dry, warm to touch, negative for any Rashes,  Wounds: Please see nursing documentation        -----------------------------------------------------------------------------------------------------------------------------    LABs:  CBC Latest Ref Rng & Units 08/09/2021 08/08/2021 08/06/2021  WBC 4.0 - 10.5 K/uL 13.4(H) 14.1(H) 10.3  Hemoglobin 13.0 - 17.0 g/dL 13.9 14.7 15.0  Hematocrit 39.0 - 52.0 % 42.3 45.2 44.7  Platelets 150 - 400 K/uL 190 187 141(L)   CMP Latest Ref Rng & Units 08/09/2021 08/08/2021  08/07/2021  Glucose 70 - 99 mg/dL 149(H) 152(H) 194(H)  BUN 8 - 23 mg/dL 28(H) 28(H) 24(H)  Creatinine 0.61 - 1.24 mg/dL 0.96 0.93 1.08  Sodium 135 - 145 mmol/L 131(L) 136 134(L)  Potassium 3.5 - 5.1 mmol/L 4.6 4.8 5.0  Chloride 98 - 111 mmol/L 100 100 102  CO2 22 - 32 mmol/L 24 27 26   Calcium 8.9 - 10.3 mg/dL 8.3(L) 9.0 8.6(L)  Total Protein 6.5 - 8.1 g/dL - - 6.9  Total Bilirubin 0.3 - 1.2 mg/dL - - 1.0  Alkaline Phos 38 - 126 U/L - - 96  AST 15 - 41 U/L - - 24  ALT 0 - 44 U/L - - 50(H)       Micro Results Recent Results (from the past 240 hour(s))  Resp Panel by RT-PCR (Flu A&B, Covid) Nasopharyngeal Swab     Status: Abnormal   Collection Time: 08/06/21  7:28 PM   Specimen: Nasopharyngeal Swab; Nasopharyngeal(NP) swabs in vial transport medium  Result Value Ref Range Status   SARS Coronavirus 2 by RT PCR NEGATIVE NEGATIVE Final    Comment: (NOTE) SARS-CoV-2 target nucleic acids are NOT DETECTED.  The SARS-CoV-2 RNA is generally detectable in upper respiratory specimens during the acute phase of infection. The lowest concentration of SARS-CoV-2 viral copies this assay can detect is 138 copies/mL. A negative result does not preclude SARS-Cov-2 infection and should not be used as the sole basis for treatment or other patient management decisions. A negative result may occur with  improper specimen collection/handling, submission of specimen other than nasopharyngeal swab, presence of viral mutation(s) within the areas targeted by this assay, and inadequate number of viral copies(<138 copies/mL). A negative result must be combined with clinical observations, patient history, and epidemiological information. The expected result is Negative.  Fact Sheet for Patients:  EntrepreneurPulse.com.au  Fact Sheet for Healthcare Providers:  IncredibleEmployment.be  This test is no t yet approved or cleared by the [place] FDA and  has been  authorized for detection and/or diagnosis of SARS-CoV-2 by FDA under an Emergency Use Authorization (EUA). This EUA will remain  in effect (meaning this test can be used) for the duration of the COVID-19 declaration under Section 564(b)(1) of the Act, 21 U.S.C.section 360bbb-3(b)(1), unless the authorization is terminated  or revoked sooner.       Influenza A by PCR POSITIVE (A) NEGATIVE Final   Influenza B by PCR NEGATIVE NEGATIVE Final    Comment: (  NOTE) The Xpert Xpress SARS-CoV-2/FLU/RSV plus assay is intended as an aid in the diagnosis of influenza from Nasopharyngeal swab specimens and should not be used as a sole basis for treatment. Nasal washings and aspirates are unacceptable for Xpert Xpress SARS-CoV-2/FLU/RSV testing.  Fact Sheet for Patients: EntrepreneurPulse.com.au  Fact Sheet for Healthcare Providers: IncredibleEmployment.be  This test is not yet approved or cleared by the [place] FDA and has been authorized for detection and/or diagnosis of SARS-CoV-2 by FDA under an Emergency Use Authorization (EUA). This EUA will remain in effect (meaning this test can be used) for the duration of the COVID-19 declaration under Section 564(b)(1) of the Act, 21 U.S.C. section 360bbb-3(b)(1), unless the authorization is terminated or revoked.  Performed at [facility], [address] [place], [place] [address]     Radiology Reports Portable chest 1 View  Result Date: 08/07/2021 CLINICAL DATA:  65 year old male with respiratory failure.  Smoker. EXAM: PORTABLE CHEST 1 VIEW COMPARISON:  Portable chest 08/06/2021 and earlier. FINDINGS: Portable AP upright view at 0439 hours. Stable lung volumes and mediastinal contours. Borderline cardiomegaly. Stable lung markings. Allowing for portable technique the lungs are clear. No pneumothorax or pleural effusion. Visualized tracheal air column is within normal limits. No acute osseous abnormality  identified. IMPRESSION: No acute cardiopulmonary abnormality. Electronically Signed   By: Genevie Ann M.D.   On: 08/07/2021 04:59   DG Chest Port 1 View  Result Date: 08/06/2021 CLINICAL DATA:  Shortness of breath EXAM: PORTABLE CHEST 1 VIEW COMPARISON:  09/21/2018 FINDINGS: Cardiomegaly. No confluent opacities, effusions or edema. No acute bony abnormality. IMPRESSION: Cardiomegaly.  No active disease. Electronically Signed   By: Rolm Baptise M.D.   On: 08/06/2021 19:45    SIGNED: Deatra James, MD, FHM. [facility],  Pager (please use [email or web address] to page/text) Please use Epic Secure Chat for non-urgent communication (7AM-7PM)  If 7PM-7AM, please contact night-coverage [email or web address], 08/09/2021, 10:15 AM

## 2021-08-09 NOTE — Anesthesia Procedure Notes (Signed)
Procedure Name: Intubation Date/Time: 08/09/2021 3:58 PM Performed by: Denese Killings, MD Pre-anesthesia Checklist: Patient identified, Emergency Drugs available, Suction available and Patient being monitored Patient Re-evaluated:Patient Re-evaluated prior to induction Oxygen Delivery Method: Circle system utilized Preoxygenation: Pre-oxygenation with 100% oxygen Induction Type: IV induction and Rapid sequence Laryngoscope Size: Glidescope and 4 Tube type: Oral Tube size: 8.0 mm Number of attempts: 1 Airway Equipment and Method: Stylet Placement Confirmation: ETT inserted through vocal cords under direct vision, positive ETCO2, breath sounds checked- equal and bilateral and CO2 detector Secured at: 24 (at lips) cm Tube secured with: Tape Dental Injury: Teeth and Oropharynx as per pre-operative assessment

## 2021-08-09 NOTE — Progress Notes (Signed)
12:28: RT called to patients room per patients sats dropping. Upon entering the room patients sats were high 80's on 6LNC. RT switched O2 device out from nasal cannula to high flow nasal cannula and increased flow to 7L with improvement in sats to 90-91%. Patient had increased WOB so RT placed patient on CPAP with a 7L O2 bleed in. MD was notified and placed order for ABG. Results are in chart. Patient complained of not being able to breath well on CPAP so mask was removed and patient was placed back on 7L nasal cannula.  1:25: RT called again to come and assess patient. Patients sats were 90% but increased WOB remained. RT placed patient back on CPAP but increased pressure to 10cm H2O from 6cm H2O. Patient expresses the increase in pressure felt better. RT informed patient if he has any trouble or discomfort to have RN contact RT.

## 2021-08-09 NOTE — Progress Notes (Signed)
   08/09/21 1323  Assess: MEWS Score  BP 108/90  Pulse Rate (!) 112  Resp (!) 32  SpO2 91 %  O2 Device Nasal Cannula  O2 Flow Rate (L/min) 9 L/min  Assess: MEWS Score  MEWS Temp 0  MEWS Systolic 0  MEWS Pulse 2  MEWS RR 2  MEWS LOC 0  MEWS Score 4  MEWS Score Color Red  Treat  Pain Scale 0-10  Pain Score 0  Notify: Provider  Provider Name/Title Dr. Roger [facility]  Date Provider Notified 08/09/21  Time Provider Notified 0141  Notification Type Page  Notification Reason Change in status;Critical result  Provider response See new orders  Date of Provider Response 08/09/21  Time of Provider Response [id number]

## 2021-08-09 NOTE — Progress Notes (Signed)
ANTICOAGULATION CONSULT NOTE - Initial Consult  Pharmacy Consult for lovenox Indication: atrial fibrillation  No Known Allergies  Patient Measurements: Height: 5\' 8"  (172.7 cm) Weight: 94 kg (207 lb 3.7 oz) IBW/kg (Calculated) : 68.4 Heparin Dosing Weight:   Vital Signs: Temp: 98.4 F (36.9 C) (12/12 1456) Temp Source: Oral (12/12 1456) BP: 125/84 (12/12 1417) Pulse Rate: 116 (12/12 1456)  Labs: Recent Labs    08/06/21 1908 08/06/21 2140 08/06/21 2348 08/07/21 0556 08/08/21 0423 08/09/21 0429  HGB 15.0  --   --   --  14.7 13.9  HCT 44.7  --   --   --  45.2 42.3  PLT 141*  --   --   --  187 190  LABPROT 13.6  --   --   --   --   --   INR 1.0  --   --   --   --   --   CREATININE 1.43*  --   --  1.08 0.93 0.96  TROPONINIHS  --  4 4  --   --   --     Estimated Creatinine Clearance: 85.3 mL/min (by C-G formula based on SCr of 0.96 mg/dL).   Medical History: Past Medical History:  Diagnosis Date   Anemia    Anxiety    Arteriosclerotic cardiovascular disease (ASCVD)    Nonobstructive; cath in 2000: 30-40% mid LAD and proximal RCA;normal EF. stress nuclear in 2006-subtle inferoseptal hypoperfusion with reversibility; negative stress EKG; good exercise tolerance   Asthma    Cataract    CHF (congestive heart failure) (HCC)    Colon polyps    30 colon polyps found on first colonoscopy   COPD (chronic obstructive pulmonary disease) (HCC)    Diverticulitis    DJD (degenerative joint disease)    GERD (gastroesophageal reflux disease)    History of kidney stones    Hyperlipidemia    Hypertension    Insomnia    Obstructive sleep apnea 12/2009   01/26/2010 AHI 83/hr   Permanent atrial fibrillation ([place])    Onset 2006 paroxysmal then progressive to persistent   PUD (peptic ulcer disease)    1980s   RLS (restless legs syndrome)    Sinusitis    Skin cancer    Type 2 diabetes mellitus (HCC)     Medications:  Medications Prior to Admission  Medication Sig Dispense  Refill Last Dose   albuterol (VENTOLIN HFA) 108 (90 Base) MCG/ACT inhaler INHALE 2 PUFFS INTO THE LUNGS EVERY 6 HOURS AS NEEDED FOR WHEEZING OR SHORTNESS OF BREATH 18 g 11 08/06/2021   apixaban (ELIQUIS) 5 MG TABS tablet TAKE 1 TABLET(5 MG) BY MOUTH TWICE DAILY 180 tablet 1 08/05/2021 at 01200   ARIPiprazole (ABILIFY) 2 MG tablet Take 1 tablet (2 mg total) by mouth daily. 90 tablet 0 08/05/2021   atorvastatin (LIPITOR) 80 MG tablet Take 1 tablet (80 mg total) by mouth daily. TAKE 1 TABLET(80 MG) BY MOUTH EVERY EVENING 90 tablet 3 08/05/2021   benzonatate (TESSALON PERLES) 100 MG capsule Take 1 capsule (100 mg total) by mouth 3 (three) times daily as needed. 20 capsule 0 08/05/2021   buPROPion (WELLBUTRIN XL) 150 MG 24 hr tablet Take 3 tablets once daily (Patient taking differently: Take 150 mg by mouth 3 (three) times daily with meals.) 270 tablet 3 08/05/2021   docusate sodium (COLACE) 100 MG capsule Take 100 mg by mouth daily as needed for mild constipation.   08/05/2021   escitalopram (LEXAPRO) 20  MG tablet TAKE 1 TABLET(20 MG) BY MOUTH DAILY 90 tablet 0 08/05/2021   ezetimibe (ZETIA) 10 MG tablet TAKE 1 TABLET BY MOUTH EVERY DAY 90 tablet 3 08/05/2021   ferrous sulfate 325 (65 FE) MG tablet Take 325 mg by mouth daily with breakfast.   08/05/2021   furosemide (LASIX) 40 MG tablet Take 1 tablet (40 mg total) by mouth daily. 90 tablet 3 08/05/2021   lisinopril (ZESTRIL) 2.5 MG tablet TAKE 1 TABLET(2.5 MG) BY MOUTH DAILY. 90 tablet 3 08/05/2021   meclizine (ANTIVERT) 25 MG tablet Take 1 tablet (25 mg total) by mouth 3 (three) times daily as needed for dizziness. 30 tablet 0 unknown   metFORMIN (GLUCOPHAGE) 500 MG tablet TAKE ONE TABLET (500 MG) BY MOUTH TWICE DAILY WITH A MEAL. 180 tablet 0 08/05/2021   metoprolol tartrate (LOPRESSOR) 100 MG tablet Take 1 tablet (100 mg total) by mouth 2 (two) times daily. 180 tablet 0 08/05/2021 at 01200   nitroGLYCERIN (NITROSTAT) 0.4 MG SL tablet Place 1 tablet (0.4 mg total)  under the tongue every 5 (five) minutes as needed. 25 tablet 3 unknown   omeprazole (PRILOSEC) 40 MG capsule Take 1 capsule by mouth once daily 30 minutes before breakfast 90 capsule 3 08/05/2021   ondansetron (ZOFRAN) 4 MG tablet Take 1 tablet (4 mg total) by mouth every 8 (eight) hours as needed for nausea or vomiting. 30 tablet 1 unknown   potassium chloride SA (KLOR-CON) 20 MEQ tablet Take 1 tablet (20 mEq total) by mouth daily. 90 tablet 3 08/05/2021   rOPINIRole (REQUIP) 3 MG tablet TAKE 1 TABLET BY MOUTH EVERY NIGHT AT BEDTIME 450 tablet 0 08/05/2021   sildenafil (VIAGRA) 100 MG tablet TAKE 1 TABLET BY MOUTH 30 MINUTES BEFORE ACTIVITY 8 tablet 0 unknown   sucralfate (CARAFATE) 1 g tablet Take one tablet po BID PRN (Patient taking differently: Take 1 g by mouth 2 (two) times daily as needed (acid reflux).) 42 tablet 5 08/05/2021   traMADol (ULTRAM) 50 MG tablet Take 1 tablet (50 mg total) by mouth twice daily as needed for moderate pain 60 tablet 4 08/05/2021   traZODone (DESYREL) 50 MG tablet TAKE 2 TABLETS (100 MG) BY MOUTH AT BEDTIME 180 tablet 1 08/05/2021   pseudoephedrine (SUDAFED) 30 MG tablet Take 30 mg by mouth every 6 (six) hours as needed for congestion. (Patient not taking: Reported on 08/06/2021)   Not Taking    Assessment: Pharmacy consulted to dose lovenox in patient with atrial fibrillation.  Patient is on apixaban prior to admission with last dose given 12/12 0944.    CBC WNL  Goal of Therapy:   Monitor platelets by anticoagulation protocol: Yes   Plan:  Start lovenox 95 mg subq every 12 hours. First dose 12/12 2200. Monitor H&H and platelets.  Margot Ables, PharmD Clinical Pharmacist 08/09/2021 4:11 PM

## 2021-08-09 NOTE — Procedures (Addendum)
Procedure Note  08/09/21   Preoperative Diagnosis: Hypoxic respiratory failure, Type A Influenza, septic shock    Postoperative Diagnosis: Same   Procedure(s) Performed: Central Line placement, left internal jugular    Surgeon: Lanell Matar. Constance Haw, MD   Assistants: None   Anesthesia: 1% lidocaine    Complications: None    Indications:Shaun [name] is a 65 y.o. with hypoxic respiratory failure, flu who had a respiratory event and required intubation. He has worsening hypotension and tachycardia. I discussed the risk and benefits of placement of the central line with his wife, including but not limited to bleeding, infection, and risk of pneumothorax. He is on eliquis and the risk of bleeding is higher. She has given consent for the procedure.    Procedure: The patient placed supine. The left chest and neck was prepped and draped in the usual sterile fashion.  Wearing full gown and gloves, I performed the procedure.  One percent lidocaine was used for local anesthesia. An ultrasound was utilized to assess the jugular vein.  The needle with syringe was advanced into the vein with dark venous return, and a wire was placed using the Seldinger technique without difficulty.  Ectopia was not noted.  The skin was knicked and a dilator was placed, and the three lumen catheter was placed over the wire with continued control of the wire.  There was good draw back of blood from all three lumens and each flushed easily with saline.  The catheter was secured in 4 points with 2-0 silk and a biopatch and dressing was placed.     The patient tolerated the procedure well, and the CXR was ordered to confirm position of the central line.   Curlene Labrum, MD [facility] [address] [place], [place] [address] [phone] (office)

## 2021-08-09 NOTE — Progress Notes (Signed)
   08/09/21 1000  Vitals  Temp 98.4 F (36.9 C)  Temp Source Oral  BP 117/81  MAP (mmHg) 94  BP Location Left Arm  BP Method Automatic  Patient Position (if appropriate) Sitting  Pulse Rate (!) 116  Pulse Rate Source Monitor  Resp (!) 24  Level of Consciousness  Level of Consciousness Alert  MEWS COLOR  MEWS Score Color Yellow  Oxygen Therapy  SpO2 94 %  O2 Device Nasal Cannula  MEWS Score  MEWS Temp 0  MEWS Systolic 0  MEWS Pulse 2  MEWS RR 1  MEWS LOC 0  MEWS Score 3

## 2021-08-09 NOTE — Consult Note (Signed)
Cardiology Consultation:   Patient ID: Shaun [name]; [id number]; March 16, 1956   Admit date: 08/06/2021 Date of Consult: 08/09/2021  Primary Care Provider: Maximiano Coss, NP Primary Cardiologist: Kirk Ruths, MD Primary Electrophysiologist: None   Patient Profile:   Shaun [name] is a 65 y.o. male with a history of permanent atrial fibrillation, chronic diastolic heart failure, CAD status post DES to the distal circumflex in 2016, type 2 diabetes mellitus, hypertension, and hyperlipidemia who is being seen today for the evaluation of atrial fibrillation with RVR in the setting of influenza at the request of Dr. Roger Shelter.  History of Present Illness:   Shaun [name] is currently admitted with worsening shortness of breath and fevers in the setting of influenza A.  He has had progressive hypoxic respiratory failure despite high flow oxygen via nasal cannula and ultimately CPAP, intubated this afternoon and placed on ventilator.  Pulmonary/CCM now following.  Chest x-ray earlier today showed mild interstitial edema and also bronchial thickening.    During course of illness he has also had rapid atrial fibrillation, history of permanent atrial fibrillation at baseline and on Eliquis and Lopressor as an outpatient.  He follows regularly with Dr. Stanford Breed, was last seen in November.  He has a history of CAD status post DES to the distal circumflex in 2016.  LVEF was 60 to 65% with severe basal septal hypertrophy by echocardiography in August 2020.  Past Medical History:  Diagnosis Date   Anemia    Anxiety    Asthma    CAD (coronary artery disease)    DES to distal circumflex 2016   Cataract    Colon polyps    30 colon polyps found on first colonoscopy   Diastolic heart failure (HCC)    Diverticulitis    DJD (degenerative joint disease)    GERD (gastroesophageal reflux disease)    History of kidney stones    Hyperlipidemia    Hypertension    Insomnia    Obstructive  sleep apnea 12/2009   01/26/2010 AHI 83/hr   Permanent atrial fibrillation ([place])    Onset 2006 paroxysmal then progressive to persistent   PUD (peptic ulcer disease)    1980s   RLS (restless legs syndrome)    Sinusitis    Skin cancer    Type 2 diabetes mellitus ([place])     Past Surgical History:  Procedure Laterality Date   BIOPSY  07/17/2018   Procedure: BIOPSY;  Surgeon: Danie Binder, MD;  [facility];  Service: Endoscopy;;  colon   BOWEL RESECTION  09/17/2018   SMALL BOWEL RESECTION: 71 CM    CARDIAC CATHETERIZATION N/A 07/21/2015   Procedure: Left Heart Cath and Coronary Angiography;  Surgeon: Peter M Martinique, MD;  Location: [facility];  Service: Cardiovascular;  Laterality: N/A;   CARDIAC CATHETERIZATION N/A 07/21/2015   Procedure: Coronary Stent Intervention;  Surgeon: Peter M Martinique, MD;  Location: [facility];  Service: Cardiovascular;  Laterality: N/A;   CIRCUMCISION N/A 04/05/2019   Procedure: CIRCUMCISION ADULT;  Surgeon: Irine Seal, MD;  Location: [facility];  Service: Urology;  Laterality: N/A;   COLONOSCOPY N/A 05/19/2014   Dr. Barnie Alderman diverticulosis/moderate external hemorrhoids, >20 simple adenomas. Genetic screening negative.    COLONOSCOPY WITH PROPOFOL N/A 07/17/2018   Dr. Oneida Alar: Diverticulosis, external/internal hemorrhoids, 32 colon polyps removed.  ten tubular adenomas removed with no high-grade dysplasia.  Advised to have surveillance colonoscopy in 3 years.   COLONOSCOPY WITH PROPOFOL N/A 06/07/2021  Procedure: COLONOSCOPY WITH PROPOFOL;  Surgeon: Eloise Harman, DO;  [facility];  Service: Endoscopy;  Laterality: N/A;  9:30 / ASA 3  (Pt was told that his time will be given at Pre-op)   ESOPHAGOGASTRODUODENOSCOPY (EGD) WITH PROPOFOL N/A 07/17/2018   Dr. Oneida Alar: Low-grade narrowing Schatzki ring at the GE junction status post dilation.  Gastritis.  Biopsy with mild nonspecific reactive gastropathy.  No H. pylori.   GIVENS  CAPSULE STUDY N/A 06/24/2019   normal   HERNIA REPAIR  1986   Left inguinal   INTRAVASCULAR PRESSURE WIRE/FFR STUDY Left 06/08/2017   Procedure: INTRAVASCULAR PRESSURE WIRE/FFR STUDY;  Surgeon: Nelva Bush, MD;  Location: [facility];  Service: Cardiovascular;  Laterality: Left;  LAD and CFX   LAPAROTOMY N/A 09/17/2018   Procedure: EXPLORATORY LAPAROTOMY;  Surgeon: Virl Cagey, MD;  Location: [facility];  Service: General;  Laterality: N/A;   LEFT HEART CATH AND CORONARY ANGIOGRAPHY N/A 06/08/2017   Procedure: LEFT HEART CATH AND CORONARY ANGIOGRAPHY;  Surgeon: Nelva Bush, MD;  Location: [place];  Service: Cardiovascular;  Laterality: N/A;   POLYPECTOMY  07/17/2018   Procedure: POLYPECTOMY;  Surgeon: Danie Binder, MD;  [facility];  Service: Endoscopy;;  colon   POLYPECTOMY  06/07/2021   Procedure: POLYPECTOMY INTESTINAL;  Surgeon: Eloise Harman, DO;  [facility];  Service: Endoscopy;;   ROTATOR CUFF REPAIR     Right   SAVORY DILATION N/A 07/17/2018   Procedure: SAVORY DILATION;  Surgeon: Danie Binder, MD;  [facility];  Service: Endoscopy;  Laterality: N/A;     Inpatient Medications: Scheduled Meds:  ARIPiprazole  2 mg Oral Daily   atorvastatin  80 mg Oral Daily   buPROPion  150 mg Oral TID WC   Chlorhexidine Gluconate Cloth  6 each Topical Daily   COVID-19 mRNA bivalent vaccine (Pfizer)  0.3 mL Intramuscular ONCE-1600   enoxaparin (LOVENOX) injection  95 mg Subcutaneous Q12H   escitalopram  20 mg Oral Daily   etomidate       ezetimibe  10 mg Oral Daily   haloperidol lactate       haloperidol lactate  5 mg Intravenous Once   insulin aspart  0-15 Units Subcutaneous Q4H   ipratropium  0.5 mg Nebulization BID   levalbuterol  1.25 mg Nebulization BID   LORazepam       methylPREDNISolone (SOLU-MEDROL) injection  80 mg Intravenous Q12H   metoprolol tartrate       metoprolol tartrate  5 mg Intravenous Once    midazolam PF       oseltamivir  75 mg Oral BID   pantoprazole (PROTONIX) IV  40 mg Intravenous Q24H   propofol       rOPINIRole  1 mg Oral QHS   succinylcholine       traZODone  100 mg Oral QHS   Continuous Infusions:  amiodarone     amiodarone     diltiazem (CARDIZEM) infusion     fentaNYL infusion INTRAVENOUS     midazolam     propofol     propofol (DIPRIVAN) infusion     PRN Meds: acetaminophen **OR** acetaminophen, ALPRAZolam, levalbuterol, meclizine, ondansetron **OR** ondansetron (ZOFRAN) IV, oxyCODONE  Allergies:   No Known Allergies  Social History:   Social History   Tobacco Use   Smoking status: Every Day    Packs/day: 0.50    Years: 39.00    Pack years: 19.50  Types: Cigarettes    Start date: 07/24/1970   Smokeless tobacco: Never  Substance Use Topics   Alcohol use: No    Alcohol/week: 0.0 standard drinks     Family History:   The patient's family history includes Brain cancer in his maternal uncle; Breast cancer in his cousin; Breast cancer (age of onset: 44) in his mother; Cancer in his cousin and maternal uncle; Diabetes in his brother; Heart attack in his father; Hypertension in his mother; Parkinson's disease in his brother; Skin cancer (age of onset: 50) in his sister. There is no history of Colon cancer.  ROS:  Please see the history of present illness.  Unable to be obtained at present.  Physical Exam/Data:   Vitals:   08/09/21 1323 08/09/21 1401 08/09/21 1417 08/09/21 1456  BP: 108/90 (!) 132/97 125/84   Pulse: (!) 112  (!) 128 (!) 116  Resp: (!) 32 (!) 36 (!) 38 (!) 28  Temp:    98.4 F (36.9 C)  TempSrc:    Oral  SpO2: 91%  (!) 89% (!) 87%  Weight:    94 kg  Height:    5\' 8"  (1.727 m)    Intake/Output Summary (Last 24 hours) at 08/09/2021 1613 Last data filed at 08/08/2021 1700 Gross per 24 hour  Intake 240 ml  Output 300 ml  Net -60 ml   Filed Weights   08/08/21 0526 08/09/21 0439 08/09/21 1456  Weight: 94.6 kg 95.3 kg  94 kg   Body mass index is 31.51 kg/m.   Gen: Acutely ill-appearing. HEENT: Conjunctiva and lids normal, ET tube in place. Neck: Supple, difficult to assess JVP. Lungs: Clear anteriorly without wheezing. Cardiac: Irregularly irregular without obvious gallop or rub.. Abdomen: Soft, nontender, bowel sounds diminished. Extremities: No pitting edema, distal pulses 2+. Skin: Warm and dry. Musculoskeletal: No kyphosis. Neuropsychiatric: Sedated on ventilator.  EKG:  An ECG dated 08/06/2021 was personally reviewed today and demonstrated:  Atrial fibrillation with RVR.  Telemetry:  I personally reviewed telemetry which shows rapid atrial fibrillation  Relevant CV Studies:  Echocardiogram 04/08/2019:  1. The left ventricle has normal systolic function with an ejection  fraction of 60-65%. The cavity size was normal. Severe basal septal  hypertrophy. Left ventricular diastolic function could not be evaluated  secondary to atrial fibrillation. Elevated  left ventricular end-diastolic pressure.   2. The right ventricle has normal systolic function. The cavity was  normal. There is no increase in right ventricular wall thickness. Right  ventricular systolic pressure could not be assessed.   3. Left atrial size was mild-moderately dilated.   4. The aorta is normal in size and structure.  Laboratory Data:  Chemistry Recent Labs  Lab 08/07/21 0556 08/08/21 0423 08/09/21 0429  NA 134* 136 131*  K 5.0 4.8 4.6  CL 102 100 100  CO2 26 27 24   GLUCOSE 194* 152* 149*  BUN 24* 28* 28*  CREATININE 1.08 0.93 0.96  CALCIUM 8.6* 9.0 8.3*  GFRNONAA >60 >60 >60  ANIONGAP 6 9 7     Recent Labs  Lab 08/06/21 1908 08/07/21 0556  PROT 6.8 6.9  ALBUMIN 3.5 3.4*  AST 36 24  ALT 53* 50*  ALKPHOS 96 96  BILITOT 0.7 1.0   Hematology Recent Labs  Lab 08/06/21 1908 08/08/21 0423 08/09/21 0429  WBC 10.3 14.1* 13.4*  RBC 5.17 5.20 4.89  HGB 15.0 14.7 13.9  HCT 44.7 45.2 42.3  MCV 86.5  86.9 86.5  MCH 29.0 28.3 28.4  MCHC 33.6 32.5 32.9  RDW 13.3 13.4 13.7  PLT 141* 187 190   Cardiac Enzymes Recent Labs  Lab 08/06/21 2140 08/06/21 2348  TROPONINIHS 4 4   BNP Recent Labs  Lab 08/06/21 1908 08/09/21 0429  BNP 75.0 78.0     Radiology/Studies:  DG CHEST PORT 1 VIEW  Result Date: 08/09/2021 CLINICAL DATA:  Shortness of breath EXAM: PORTABLE CHEST 1 VIEW COMPARISON:  08/07/2021.  09/19/2018. FINDINGS: Chronic cardiomegaly and aortic atherosclerosis. Worsened venous hypertension, possibly with mild interstitial edema. No visible effusion. There could also be a bronchial thickening pattern consistent with bronchitis. IMPRESSION: Probable fluid overload/low level congestive heart failure. Cardiomegaly, venous hypertension and mild interstitial edema. Question also bronchial thickening that could go along with bronchitis. Electronically Signed   By: Nelson Chimes M.D.   On: 08/09/2021 13:17   Portable chest 1 View  Result Date: 08/07/2021 CLINICAL DATA:  65 year old male with respiratory failure.  Smoker. EXAM: PORTABLE CHEST 1 VIEW COMPARISON:  Portable chest 08/06/2021 and earlier. FINDINGS: Portable AP upright view at 0439 hours. Stable lung volumes and mediastinal contours. Borderline cardiomegaly. Stable lung markings. Allowing for portable technique the lungs are clear. No pneumothorax or pleural effusion. Visualized tracheal air column is within normal limits. No acute osseous abnormality identified. IMPRESSION: No acute cardiopulmonary abnormality. Electronically Signed   By: Genevie Ann M.D.   On: 08/07/2021 04:59   DG Chest Port 1 View  Result Date: 08/06/2021 CLINICAL DATA:  Shortness of breath EXAM: PORTABLE CHEST 1 VIEW COMPARISON:  09/21/2018 FINDINGS: Cardiomegaly. No confluent opacities, effusions or edema. No acute bony abnormality. IMPRESSION: Cardiomegaly.  No active disease. Electronically Signed   By: Rolm Baptise M.D.   On: 08/06/2021 19:45    Assessment  and Plan:   1.  Atrial fibrillation with RVR in the setting of acute hypoxic respiratory failure with influenza A.  He has permanent atrial fibrillation at baseline with CHA2DS2-VASc score of 4-5.  On Lopressor and Eliquis as an outpatient.  Recent systolics 720-947.  2.  Possible acute on chronic diastolic heart failure with interstitial edema by chest x-ray.  Last echocardiogram was in 2020 at which point LVEF was 60 to 65%.  BNP only 78 and initial high-sensitivity troponin I levels were normal however.  Was on Lasix with potassium supplement as an outpatient.  3.  History of CAD status post DES to the distal circumflex in 2016.  Follow-up cardiac catheterization in 2018 revealed patent stent site with otherwise mild to moderate nonobstructive disease.  Has been on Lopressor, lisinopril, and Lipitor as an outpatient.  4.  Acute renal insufficiency with creatinine up to 1.43, down to 0.93 today.  Patient intubated and placed on ventilator this afternoon for respiratory support.  Would start IV amiodarone for heart rate control, can initiate either divided dose IV Lopressor or intravenous diltiazem if blood pressure tolerates and additional heart rate control required.  Would eventually plan a follow-up echocardiogram ideally when heart rate is better controlled to make sure no decrease in LVEF. At this point do not suspect myocarditis or ACS.  Check follow-up cardiac enzymes in the morning with ECG.  Signed, Rozann Lesches, MD  08/09/2021 4:13 PM

## 2021-08-09 NOTE — TOC Initial Note (Signed)
Transition of Care [facility]) - Initial/Assessment Note    Patient Details  Name: Shaun [name] MRN: [id number] Date of Birth: 1955/12/11  Transition of [facility]) CM/SW Contact:    Salome Arnt, [place] Phone Number: 08/09/2021, 8:33 AM  Clinical Narrative:  Pt admitted with acute respiratory failure with hypoxia. Assessment completed due to high risk readmission score. Pt reports he lives with his wife and is independent with ADLs. He occasionally uses a cane to ambulate. Pt drives himself to appointments. He plans to return home when medically stable. No needs reported at this time. TOC will continue to follow.                  Expected Discharge Plan: Home/Self Care Barriers to Discharge: Continued Medical Work up   Patient Goals and CMS Choice Patient states their goals for this hospitalization and ongoing recovery are:: return home   Choice offered to / list presented to : Patient  Expected Discharge Plan and Services Expected Discharge Plan: Home/Self Care In-house Referral: Clinical Social Work     Living arrangements for the past 2 months: Single Family Home                 DME Arranged: N/A                    Prior Living Arrangements/Services Living arrangements for the past 2 months: Single Family Home Lives with:: Spouse Patient language and need for interpreter reviewed:: Yes Do you feel safe going back to the place where you live?: Yes      Need for Family Participation in Patient Care: No (Comment)   Current home services: DME (cane) Criminal Activity/Legal Involvement Pertinent to Current Situation/Hospitalization: No - Comment as needed  Activities of Daily Living Home Assistive Devices/Equipment: Cane (specify quad or straight) ADL Screening (condition at time of admission) Patient's cognitive ability adequate to safely complete daily activities?: Yes Is the patient deaf or have difficulty hearing?: No Does the patient have  difficulty seeing, even when wearing glasses/contacts?: No Does the patient have difficulty concentrating, remembering, or making decisions?: No Patient able to express need for assistance with ADLs?: Yes Does the patient have difficulty dressing or bathing?: No Independently performs ADLs?: Yes (appropriate for developmental age) Does the patient have difficulty walking or climbing stairs?: Yes Weakness of Legs: Both Weakness of Arms/Hands: None  Permission Sought/Granted                  Emotional Assessment   Attitude/Demeanor/Rapport: Engaged Affect (typically observed): Appropriate Orientation: : Oriented to Self, Oriented to Place, Oriented to  Time, Oriented to Situation Alcohol / Substance Use: Tobacco Use Psych Involvement: No (comment)  Admission diagnosis:  SOB (shortness of breath) [R06.02] Acute respiratory failure with hypoxia ([place]) [J96.01] Influenza [J11.1] Patient Active Problem List   Diagnosis Date Noted   Acute respiratory failure with hypoxia ([place]) 08/06/2021   Ventral hernia 12/31/2020   Nausea with vomiting 12/31/2019   Dyslipidemia, goal LDL below 70 06/18/2019   Hx of adenomatous colonic polyps 10/17/2018   Anemia 10/17/2018   Phimosis of penis 09/27/2018   Osteoarthritis of right hip 06/20/2018   Esophageal dysphagia 05/21/2018   GERD (gastroesophageal reflux disease) 05/21/2018   RLS (restless legs syndrome)    PUD (peptic ulcer disease)    Insomnia    Chronic diastolic CHF (congestive heart failure) (HCC)    Asthma    Arteriosclerotic cardiovascular disease (ASCVD)    Chronic anticoagulation  01/27/2017   Depression 09/24/2015   COPD (chronic obstructive pulmonary disease) ([place]) 07/20/2015   Accelerating angina ([place]) 07/19/2015   Genetic testing 09/09/2014   H/O adenomatous polyp of colon 08/11/2014   Non-insulin treated type 2 diabetes mellitus ([place]) 02/09/2011   CAD S/P percutaneous coronary angioplasty    Tobacco abuse    Essential  hypertension    Chronic atrial fibrillation ([place]) 02/23/2010   Obstructive sleep apnea 12/27/2009   PCP:  Maximiano Coss, NP Pharmacy:   [facility] [place] [address] Phone: [phone] Fax: [place], [place] - [address] [place] [address] [place] [address] [place] [address] Phone: [phone] Fax: [phone]     Social Determinants of Health (SDOH) Interventions    Readmission Risk Interventions Readmission Risk Prevention Plan 08/09/2021  Transportation Screening Complete  HRI or Home Care Consult Complete  Social Work Consult for [place] Planning/Counseling Complete  Palliative Care Screening Not Applicable  Medication Review Press photographer) Complete  Some recent data might be hidden

## 2021-08-10 ENCOUNTER — Other Ambulatory Visit (HOSPITAL_COMMUNITY): Payer: Medicare HMO

## 2021-08-10 ENCOUNTER — Ambulatory Visit: Payer: 59 | Admitting: Registered Nurse

## 2021-08-10 ENCOUNTER — Inpatient Hospital Stay (HOSPITAL_COMMUNITY): Payer: 59

## 2021-08-10 ENCOUNTER — Other Ambulatory Visit (HOSPITAL_COMMUNITY): Payer: Self-pay | Admitting: *Deleted

## 2021-08-10 DIAGNOSIS — I4891 Unspecified atrial fibrillation: Secondary | ICD-10-CM

## 2021-08-10 DIAGNOSIS — I5033 Acute on chronic diastolic (congestive) heart failure: Secondary | ICD-10-CM

## 2021-08-10 LAB — BASIC METABOLIC PANEL WITH GFR
Anion gap: 8 (ref 5–15)
BUN: 29 mg/dL — ABNORMAL HIGH (ref 8–23)
CO2: 24 mmol/L (ref 22–32)
Calcium: 7.9 mg/dL — ABNORMAL LOW (ref 8.9–10.3)
Chloride: 99 mmol/L (ref 98–111)
Creatinine, Ser: 0.92 mg/dL (ref 0.61–1.24)
GFR, Estimated: >60 mL/min
Glucose, Bld: 239 mg/dL — ABNORMAL HIGH (ref 70–99)
Potassium: 3.9 mmol/L (ref 3.5–5.1)
Sodium: 131 mmol/L — ABNORMAL LOW (ref 135–145)

## 2021-08-10 LAB — URINALYSIS, ROUTINE W REFLEX MICROSCOPIC
Bilirubin Urine: NEGATIVE
Glucose, UA: 50 mg/dL — AB
Ketones, ur: NEGATIVE mg/dL
Leukocytes,Ua: NEGATIVE
Nitrite: NEGATIVE
Protein, ur: 30 mg/dL — AB
RBC / HPF: >50 RBC/hpf — ABNORMAL HIGH (ref 0–5)
Specific Gravity, Urine: 1.027 (ref 1.005–1.030)
pH: 5 (ref 5.0–8.0)

## 2021-08-10 LAB — CBC
HCT: 37.9 % — ABNORMAL LOW (ref 39.0–52.0)
Hemoglobin: 12.6 g/dL — ABNORMAL LOW (ref 13.0–17.0)
MCH: 28.6 pg (ref 26.0–34.0)
MCHC: 33.2 g/dL (ref 30.0–36.0)
MCV: 86.1 fL (ref 80.0–100.0)
Platelets: 197 10*3/uL (ref 150–400)
RBC: 4.4 MIL/uL (ref 4.22–5.81)
RDW: 13.9 % (ref 11.5–15.5)
WBC: 15.2 10*3/uL — ABNORMAL HIGH (ref 4.0–10.5)
nRBC: 0 % (ref 0.0–0.2)

## 2021-08-10 LAB — GLUCOSE, CAPILLARY
Glucose-Capillary: 190 mg/dL — ABNORMAL HIGH (ref 70–99)
Glucose-Capillary: 193 mg/dL — ABNORMAL HIGH (ref 70–99)
Glucose-Capillary: 207 mg/dL — ABNORMAL HIGH (ref 70–99)
Glucose-Capillary: 211 mg/dL — ABNORMAL HIGH (ref 70–99)
Glucose-Capillary: 229 mg/dL — ABNORMAL HIGH (ref 70–99)
Glucose-Capillary: 245 mg/dL — ABNORMAL HIGH (ref 70–99)

## 2021-08-10 LAB — PHOSPHORUS: Phosphorus: 3.1 mg/dL (ref 2.5–4.6)

## 2021-08-10 LAB — BLOOD GAS, ARTERIAL
Acid-Base Excess: 0.8 mmol/L (ref 0.0–2.0)
Bicarbonate: 25.2 mmol/L (ref 20.0–28.0)
Drawn by: 21310
FIO2: 100
O2 Saturation: 97.4 %
Patient temperature: 36.7
pCO2 arterial: 39.4 mmHg (ref 32.0–48.0)
pH, Arterial: 7.416 (ref 7.350–7.450)
pO2, Arterial: 120 mmHg — ABNORMAL HIGH (ref 83.0–108.0)

## 2021-08-10 LAB — ECHOCARDIOGRAM COMPLETE
Area-P 1/2: 4.15 cm2
Height: 68 in
S' Lateral: 2.9 cm
Weight: 3245.17 oz

## 2021-08-10 LAB — EXPECTORATED SPUTUM ASSESSMENT W GRAM STAIN, RFLX TO RESP C

## 2021-08-10 LAB — MRSA NEXT GEN BY PCR, NASAL: MRSA by PCR Next Gen: DETECTED — AB

## 2021-08-10 LAB — LACTIC ACID, PLASMA: Lactic Acid, Venous: 1.4 mmol/L (ref 0.5–1.9)

## 2021-08-10 LAB — MAGNESIUM: Magnesium: 2.4 mg/dL (ref 1.7–2.4)

## 2021-08-10 LAB — TRIGLYCERIDES: Triglycerides: 238 mg/dL — ABNORMAL HIGH (ref <150)

## 2021-08-10 LAB — TROPONIN I (HIGH SENSITIVITY): Troponin I (High Sensitivity): 4 ng/L (ref <18)

## 2021-08-10 MED ORDER — MUPIROCIN 2 % EX OINT
TOPICAL_OINTMENT | Freq: Two times a day (BID) | CUTANEOUS | Status: DC
  Administered 2021-08-10 – 2021-08-23 (×3): 1 via NASAL
  Filled 2021-08-10 (×3): qty 22

## 2021-08-10 MED ORDER — ARIPIPRAZOLE 2 MG PO TABS
2.0000 mg | ORAL_TABLET | Freq: Every day | ORAL | Status: DC
  Administered 2021-08-10 – 2021-08-18 (×9): 2 mg
  Filled 2021-08-10 (×13): qty 1

## 2021-08-10 MED ORDER — ESCITALOPRAM OXALATE 10 MG PO TABS
20.0000 mg | ORAL_TABLET | Freq: Every day | ORAL | Status: DC
  Administered 2021-08-12 – 2021-08-18 (×7): 20 mg
  Filled 2021-08-10 (×7): qty 2

## 2021-08-10 MED ORDER — ATORVASTATIN CALCIUM 80 MG PO TABS
80.0000 mg | ORAL_TABLET | Freq: Every day | ORAL | Status: DC
  Administered 2021-08-10 – 2021-08-18 (×9): 80 mg
  Filled 2021-08-10: qty 2
  Filled 2021-08-10 (×3): qty 1
  Filled 2021-08-10 (×3): qty 2
  Filled 2021-08-10 (×2): qty 1

## 2021-08-10 MED ORDER — FUROSEMIDE 10 MG/ML IJ SOLN
40.0000 mg | Freq: Once | INTRAMUSCULAR | Status: AC
  Administered 2021-08-10: 40 mg via INTRAVENOUS
  Filled 2021-08-10: qty 4

## 2021-08-10 MED ORDER — ESCITALOPRAM OXALATE 10 MG PO TABS: 20.0000 mg | ORAL_TABLET | Freq: Every day | ORAL | Status: DC

## 2021-08-10 MED ORDER — MIDAZOLAM-SODIUM CHLORIDE 100-0.9 MG/100ML-% IV SOLN
1.0000 mg/h | INTRAVENOUS | Status: DC
  Administered 2021-08-10: 0.5 mg/h via INTRAVENOUS
  Administered 2021-08-11: 23:00:00 2 mg/h via INTRAVENOUS
  Administered 2021-08-13: 3.5 mg/h via INTRAVENOUS
  Filled 2021-08-10 (×4): qty 100

## 2021-08-10 NOTE — Progress Notes (Signed)
NAME:  Shaun [name], MRN:  [id number], DOB:  1956/01/09, LOS: 4 ADMISSION DATE:  08/06/2021, CONSULTATION DATE:  12/12 REFERRING MD:  Richarda Blade, CHIEF COMPLAINT:  resp distress    History of Present Illness:     84 yowm active smoker with nl pfts 06/2019 with history of anxiety, ASCVD, CHF,  diabetes mellitus type 2, hyperlipidemia, hypertension, obstructive sleep apnea, CAF and more presented to ED with chief complaint of dyspnea.  Patient reports he has had fever and dyspnea for 1 week PTA. He reports that he was diagnosed with the flu 6 days PTA.  He was given a steroid and Tessalon Perles per his report.   He had a T-max at home of 102.    Patient does admit to orthopnea but no increase in peripheral edema.  He has a cough but is only occasionally productive with green sputum.  No hemoptysis.  He does have some chest tightness that is been going on for the whole week.  It is worse with exertion, better with rest.  Worse lying  flat as well.  Patient reports feeling lightheaded when he has the chest tightness.    He does not wear oxygen at home.    He does sleep with a CPAP at home.  Patient does report decreased p.o. intake.  Yesterday all he had all day was chicken soup.  Last bowel movement was yesterday.      The ED Temp 99.7, heart rate 101-125, respiratory rate 16-25, blood pressure 125/88, satting 96% on 3 L nasal cannula  Was improving but rapidly worse sob / desats 12/12 requiring bipap and then intubation by anesthesia         Pertinent  Medical History  CAF - no recent echo, lat one 03/2019  Mod LAE with elevated LVEDP HBP DM OSA on CPAP    Significant Hospital Events: Including procedures, antibiotic start and stop dates in addition to other pertinent events   Oral ET  12/12  L IJ  CVL 12/12 Placed on Amiodarone drip 12/12   Scheduled Meds:  ARIPiprazole  2 mg Per Tube Daily   atorvastatin  80 mg Per Tube Daily   chlorhexidine gluconate (MEDLINE KIT)  15 mL  Mouth Rinse BID   Chlorhexidine Gluconate Cloth  6 each Topical Daily   enoxaparin (LOVENOX) injection  95 mg Subcutaneous Q12H   [START ON 08/12/2021] escitalopram  20 mg Per Tube Daily   insulin aspart  0-15 Units Subcutaneous Q4H   ipratropium  0.5 mg Nebulization BID   levalbuterol  1.25 mg Nebulization BID   mouth rinse  15 mL Mouth Rinse 10 times per day   methylPREDNISolone (SOLU-MEDROL) injection  80 mg Intravenous Q12H   mupirocin ointment   Nasal BID   oseltamivir  75 mg Per Tube BID   pantoprazole (PROTONIX) IV  40 mg Intravenous Q24H   Continuous Infusions:  amiodarone 30 mg/hr (08/10/21 1105)   ceFEPime (MAXIPIME) IV Stopped (08/10/21 1041)   fentaNYL infusion INTRAVENOUS 75 mcg/hr (08/10/21 1105)   midazolam 3 mg/hr (08/10/21 1105)   phenylephrine (NEO-SYNEPHRINE) Adult infusion Stopped (08/10/21 1101)   PRN Meds:.acetaminophen **OR** acetaminophen, levalbuterol, ondansetron **OR** ondansetron (ZOFRAN) IV    Interim History / Subjective:  Sedated on diprovan/fent / breathing slt over back up ratestill borerline bp on neo low dose   Objective   Blood pressure 120/67, pulse (!) 110, temperature 98.1 F (36.7 C), temperature source Axillary, resp. rate 18, height 5' 8"  (1.727 m), weight 92  kg, SpO2 94 %. CVP:  [8 mmHg-10 mmHg] 9 mmHg  Vent Mode: PRVC FiO2 (%):  [80 %-100 %] 80 % Set Rate:  [18 bmp] 18 bmp Vt Set:  [510 mL-540 mL] 540 mL PEEP:  [5 cmH20] 5 cmH20 Plateau Pressure:  [10 cmH20-13 cmH20] 10 cmH20   Intake/Output Summary (Last 24 hours) at 08/10/2021 1122 Last data filed at 08/10/2021 1105 Gross per 24 hour  Intake 3028.81 ml  Output 1475 ml  Net 1553.81 ml   Filed Weights   08/09/21 0439 08/09/21 1456 08/10/21 0315  Weight: 95.3 kg 94 kg 92 kg   CVP:  [8 mmHg-10 mmHg] 9 mmHg   Examination: Tmax  101.3 General appearance:    acutely ill elderly wm on vent    No jvd Oropharynx et/og Neck supple Lungs with min distant exp rhonchi  bilaterall IRIR pulse down to low 100s Abd obese with limted excursion  Extr warm with no edema or clubbing noted Neuro  Sensorium sedated,  no apparent motor deficits    I personally reviewed images and agree with radiology impression as follows:  CXR:   portable am 12/13 Cardiomegaly. Interstitial markings in the parahilar regions and lower lung fields appear more prominent. This may suggest mild interstitial edema or interstitial pneumonitis or an artifact due to differences in the techniques.          Assessment & Plan:  1) Acute resp distress in pt with Influenza A infection/? Pna and component of acute on chronic chf with diastolic dysfunction likely esp in RAF  >>> intubation/ sedation and mech vent   >>> adjust pee/fio2 per ards protocol, not air trapping so can use higher RR if needed  >>> keep on dry side if bun/ creat/uop allow  2) Prob underlying asthma in this smoker but no evidence of COPD  3 ) CAF better controlled but on Amiodarone drip since 12/12 >>> rx per cards  4)  hypotensive with adequate CVP c/w low grade SIRS though PCT low not c/w bacterial sepsis more likely viremia >>> hold further diuresis, esp if bp/bun/ creat limit  >>>> agree Neo drip best choice/ monitoring cvp q shift as well       Best Practice (right click and "Reselect all SmartList Selections" daily)   Diet/type: NPO DVT prophylaxis: LMWH GI prophylaxis: PPI Lines: N/A Foley:  Yes, and it is still needed Code Status:  full code Last date of multidisciplinary goals of care discussion  per [facility]   Labs   CBC: Recent Labs  Lab 08/06/21 1908 08/08/21 0423 08/09/21 0429 08/09/21 1623 08/10/21 0350  WBC 10.3 14.1* 13.4* 16.1* 15.2*  NEUTROABS  --   --   --  13.5*  --   HGB 15.0 14.7 13.9 14.3 12.6*  HCT 44.7 45.2 42.3 43.3 37.9*  MCV 86.5 86.9 86.5 86.6 86.1  PLT 141* 187 190 195 801    Basic Metabolic Panel: Recent Labs  Lab 08/07/21 0556 08/08/21 0423  08/09/21 0429 08/09/21 1623 08/10/21 0350  NA 134* 136 131* 131* 131*  K 5.0 4.8 4.6 5.0 3.9  CL 102 100 100 98 99  CO2 26 27 24 24 24   GLUCOSE 194* 152* 149* 223* 239*  BUN 24* 28* 28* 28* 29*  CREATININE 1.08 0.93 0.96 1.06 0.92  CALCIUM 8.6* 9.0 8.3* 8.1* 7.9*  MG 2.3  --   --   --  2.4  PHOS  --   --   --   --  3.1   GFR: Estimated Creatinine Clearance: 88.1 mL/min (by C-G formula based on SCr of 0.92 mg/dL). Recent Labs  Lab 08/08/21 0423 08/09/21 0429 08/09/21 1623 08/09/21 1833 08/10/21 0350 08/10/21 0753  PROCALCITON  --   --  <0.10  --   --   --   WBC 14.1* 13.4* 16.1*  --  15.2*  --   LATICACIDVEN  --   --  2.9* 2.4*  --  1.4    Liver Function Tests: Recent Labs  Lab 08/06/21 1908 08/07/21 0556 08/09/21 1623  AST 36 24 28  ALT 53* 50* 47*  ALKPHOS 96 96 101  BILITOT 0.7 1.0 2.1*  PROT 6.8 6.9 7.0  ALBUMIN 3.5 3.4* 3.3*   No results for input(s): LIPASE, AMYLASE in the last 168 hours. No results for input(s): AMMONIA in the last 168 hours.  ABG    Component Value Date/Time   PHART 7.416 08/10/2021 0650   PCO2ART 39.4 08/10/2021 0650   PO2ART 120 (H) 08/10/2021 0650   HCO3 25.2 08/10/2021 0650   TCO2 25 10/18/2016 1729   ACIDBASEDEF 1.4 08/09/2021 1738   O2SAT 97.4 08/10/2021 0650     Coagulation Profile: Recent Labs  Lab 08/06/21 1908 08/09/21 1722  INR 1.0 1.5*    Cardiac Enzymes: No results for input(s): CKTOTAL, CKMB, CKMBINDEX, TROPONINI in the last 168 hours.  HbA1C: Hgb A1c MFr Bld  Date/Time Value Ref Range Status  08/07/2021 05:56 AM 6.9 (H) 4.8 - 5.6 % Final    Comment:    (NOTE) Pre diabetes:          5.7%-6.4%  Diabetes:              >6.4%  Glycemic control for   <7.0% adults with diabetes   02/10/2021 08:53 AM 6.8 (H) 4.6 - 6.5 % Final    Comment:    Glycemic Control Guidelines for People with Diabetes:Non Diabetic:  <6%Goal of Therapy: <7%Additional Action Suggested:  >8%     CBG: Recent Labs  Lab  08/09/21 2000 08/10/21 0003 08/10/21 0342 08/10/21 0729 08/10/21 1113  GLUCAP 234* 245* 229* 211* 190*       The patient is critically ill with multiple organ systems failure and requires high complexity decision making for assessment and support, frequent evaluation and titration of therapies, application of advanced monitoring technologies and extensive interpretation of multiple databases. Critical Care Time devoted to patient care services described in this note is 58mn  minutes.   MChristinia Gully MD Pulmonary and [phone]  After 7:00 pm call Elink  [phone]

## 2021-08-10 NOTE — Progress Notes (Signed)
Progress Note  Patient Name: Shaun [name] Date of Encounter: 08/10/2021  Primary Cardiologist: Kirk Ruths, MD  Subjective   Sedated on the ventilator.  Inpatient Medications    Scheduled Meds:  ARIPiprazole  2 mg Oral Daily   atorvastatin  80 mg Oral Daily   chlorhexidine gluconate (MEDLINE KIT)  15 mL Mouth Rinse BID   Chlorhexidine Gluconate Cloth  6 each Topical Daily   enoxaparin (LOVENOX) injection  95 mg Subcutaneous Q12H   [START ON 08/12/2021] escitalopram  20 mg Oral Daily   insulin aspart  0-15 Units Subcutaneous Q4H   ipratropium  0.5 mg Nebulization BID   levalbuterol  1.25 mg Nebulization BID   mouth rinse  15 mL Mouth Rinse 10 times per day   methylPREDNISolone (SOLU-MEDROL) injection  80 mg Intravenous Q12H   mupirocin ointment   Nasal BID   oseltamivir  75 mg Per Tube BID   pantoprazole (PROTONIX) IV  40 mg Intravenous Q24H   Continuous Infusions:  amiodarone 30 mg/hr (08/10/21 0700)   ceFEPime (MAXIPIME) IV Stopped (08/10/21 0104)   fentaNYL infusion INTRAVENOUS 25 mcg/hr (08/10/21 0700)   midazolam     phenylephrine (NEO-SYNEPHRINE) Adult infusion 10 mcg/min (08/10/21 0700)   PRN Meds: acetaminophen **OR** acetaminophen, levalbuterol, ondansetron **OR** ondansetron (ZOFRAN) IV   Vital Signs    Vitals:   08/10/21 0656 08/10/21 0700 08/10/21 0725 08/10/21 0754  BP:  104/76    Pulse:  85 (!) 143   Resp:  15 14   Temp: 98 F (36.7 C)  (!) 97.4 F (36.3 C)   TempSrc: Oral  Axillary   SpO2:  98% 97% 97%  Weight:      Height:        Intake/Output Summary (Last 24 hours) at 08/10/2021 0847 Last data filed at 08/10/2021 0700 Gross per 24 hour  Intake 2745.22 ml  Output 1475 ml  Net 1270.22 ml   Filed Weights   08/09/21 0439 08/09/21 1456 08/10/21 0315  Weight: 95.3 kg 94 kg 92 kg    Telemetry    Rate controlled atrial fibrillation.  Personally reviewed.  ECG    An ECG dated 08/10/2021 was personally reviewed today and  demonstrated:  Atrial fibrillation.  Physical Exam   GEN: No acute distress.   Neck: No JVD. Cardiac: Irregularly irregular without gallop.  Respiratory: Nonlabored. Clear to auscultation anteriorly. GI: Soft, nontender, bowel sounds present. MS: No edema; No deformity. Neuro: Sedated. Psych: Sedated.  Labs    Chemistry Recent Labs  Lab 08/06/21 1908 08/07/21 0556 08/08/21 0423 08/09/21 0429 08/09/21 1623 08/10/21 0350  NA 132* 134*   < > 131* 131* 131*  K 3.9 5.0   < > 4.6 5.0 3.9  CL 102 102   < > 100 98 99  CO2 21* 26   < > 24 24 24   GLUCOSE 158* 194*   < > 149* 223* 239*  BUN 24* 24*   < > 28* 28* 29*  CREATININE 1.43* 1.08   < > 0.96 1.06 0.92  CALCIUM 8.1* 8.6*   < > 8.3* 8.1* 7.9*  PROT 6.8 6.9  --   --  7.0  --   ALBUMIN 3.5 3.4*  --   --  3.3*  --   AST 36 24  --   --  28  --   ALT 53* 50*  --   --  47*  --   ALKPHOS 96 96  --   --  101  --   BILITOT 0.7 1.0  --   --  2.1*  --   GFRNONAA 54* >60   < > >60 >60 >60  ANIONGAP 9 6   < > 7 9 8    < > = values in this interval not displayed.     Hematology Recent Labs  Lab 08/09/21 0429 08/09/21 1623 08/10/21 0350  WBC 13.4* 16.1* 15.2*  RBC 4.89 5.00 4.40  HGB 13.9 14.3 12.6*  HCT 42.3 43.3 37.9*  MCV 86.5 86.6 86.1  MCH 28.4 28.6 28.6  MCHC 32.9 33.0 33.2  RDW 13.7 13.8 13.9  PLT 190 195 197    Cardiac Enzymes Recent Labs  Lab 08/06/21 2140 08/06/21 2348 08/10/21 0350  TROPONINIHS 4 4 4     BNP Recent Labs  Lab 08/06/21 1908 08/09/21 0429  BNP 75.0 78.0     Radiology    DG CHEST PORT 1 VIEW  Result Date: 08/09/2021 CLINICAL DATA:  Shortness of breath EXAM: PORTABLE CHEST 1 VIEW COMPARISON:  08/07/2021.  09/19/2018. FINDINGS: Chronic cardiomegaly and aortic atherosclerosis. Worsened venous hypertension, possibly with mild interstitial edema. No visible effusion. There could also be a bronchial thickening pattern consistent with bronchitis. IMPRESSION: Probable fluid overload/low  level congestive heart failure. Cardiomegaly, venous hypertension and mild interstitial edema. Question also bronchial thickening that could go along with bronchitis. Electronically Signed   By: Nelson Chimes M.D.   On: 08/09/2021 13:17   DG Chest Port 1V same Day  Result Date: 08/09/2021 CLINICAL DATA:  Central line placement. EXAM: PORTABLE CHEST 1 VIEW COMPARISON:  Chest radiograph dated 08/09/2021. FINDINGS: Endotracheal tube with tip approximately 4.5 cm above the carina. Enteric tube extends below the diaphragm with tip in the left upper abdomen, likely in the proximal stomach. Left IJ central venous line with tip over central SVC. Mild cardiomegaly with mild vascular congestion. Left lung base density may represent atelectasis. Developing infiltrate is not excluded. No large pleural effusion. No pneumothorax. No acute osseous pathology. Degenerative changes of spine. IMPRESSION: 1. Endotracheal tube above the carina. 2. Left IJ central venous line with tip over central SVC. No pneumothorax. Mild cardiomegaly and vascular congestion. Electronically Signed   By: Anner Crete M.D.   On: 08/09/2021 19:59   DG Chest Port 1V same Day  Result Date: 08/09/2021 CLINICAL DATA:  65 year old male with intubation EXAM: PORTABLE CHEST 1 VIEW COMPARISON:  08/09/2021, 08/07/2021 FINDINGS: Cardiomediastinal silhouette unchanged in size and contour. No evidence of central vascular congestion. No interlobular septal thickening. Interval placement of endotracheal tube, terminating suitably above the carina, 3.9 cm. Interval placement of gastric tube projecting over the mediastinum and terminating within the left upper quadrant Low lung volumes with no confluent airspace disease. Coarsened interstitial markings similar to the recent comparison chest x-rays. No pneumothorax or pleural effusion. No acute displaced fracture. Similar appearance lytic changes at the distal right clavicle IMPRESSION: Interval placement of  endotracheal tube, terminating suitably above the carina. Gastric tube terminates in the stomach. Low lung volumes with likely atelectasis at the bases Electronically Signed   By: Corrie Mckusick D.O.   On: 08/09/2021 16:28    Cardiac Studies   Follow-up echocardiogram pending.  Assessment & Plan    1.  Permanent atrial fibrillation with CHA2DS2-VASc score of 4-5.  He has had recent RVR in the setting of acute hypoxic respiratory failure with influenza A.  Heart rate under better control now on IV amiodarone.  Standing Lopressor held in the setting of  low blood pressure and pressor requirement.  Eliquis transitioned to Lovenox.  High-sensitivity troponin I level was normal, doubt associated myocarditis or ACS.  2.  Hypoxic respiratory failure now with ventilator requirement in the setting of influenza A and possibly associated acute on chronic diastolic heart failure.  Mild vascular congestion by chest x-ray.  Recent BNP 78.  Currently on empiric antibiotics with Maxipime, so Tamiflu.  Requiring phenylephrine intermittently for blood pressure support.  3.  History of CAD status post DES to the distal circumflex in 2016, patent at angiography in 2018 with mild to moderate residual CAD.  Continue Lipitor, Lopressor presently on hold.  Follow-up repeat echocardiogram.  Continue IV amiodarone, Lovenox, and eventually work back toward addition of beta-blocker depending on clinical course and blood pressure.  Will give dose of IV Lasix today and follow urine output, intake more than output last 24 hours.  Signed, Rozann Lesches, MD  08/10/2021, 8:47 AM

## 2021-08-10 NOTE — Progress Notes (Signed)
PROGRESS NOTE    Patient: Shaun [name]                            PCP: Maximiano Coss, NP                    DOB: 1955-09-12            DOA: 08/06/2021 [id number]             DOS: 08/10/2021, 11:39 AM   LOS: 4 days   Date of Service: The patient was seen and examined on 08/10/2021  Subjective:   The patient was seen and examined this morning, still intubated on the vent, on amiodarone drip... Tachycardic,   Note: On 08/09/2021 Patient had become progressively hypoxic, restless, worsening A. fib with RVR yesterday evening.  Subsequently became obtunded, Patient was successfully intubated/central line was placed Vent assist was initiated along with amiodarone drip   Brief Narrative:   Shaun [name]  is a 65 y.o. male, with history of anxiety, ASCVD, CHF, COPD, diabetes mellitus type 2, hyperlipidemia, hypertension, obstructive sleep apnea, paroxysmal atrial fibrillation, and more presents ED with chief complaint of dyspnea.  Patient reports he has had fever and dyspnea for 1 week.  He reports that he was diagnosed with the flu 6 days ago.  He was given a steroid and Tessalon Perles per his report.  He reports that it was not helpful.  He had a T-max at home of 102.  He tried Tylenol and thinks that he had temporary relief.  He has a cough but is only occasionally productive with green sputum.   He does not wear oxygen at home.  He does sleep with a CPAP at home.  Pa   Patient smokes half a pack a day but declines a nicotine patch as he has not had a cigarette since he has been ill.   Patient is vaccinated for COVID but not flu.  Patient is full code.  ED Temp 99.7, heart rate 101-125, respiratory rate 16-25, blood pressure 125/88, satting 96% on 3 L nasal cannula No leukocytosis with white blood cell count 10.3, hemoglobin 15 Chemistry panel reveals a decreased bicarb at 21 and an AKI with a creatinine of 1.43 Patient is fluid positive Chest x-ray shows no active  disease    Assessment & Plan:   Principal Problem:   Acute respiratory failure with hypoxia (HCC) Active Problems:   Chronic atrial fibrillation (HCC)   Tobacco abuse   Obstructive sleep apnea   Essential hypertension   Non-insulin treated type 2 diabetes mellitus (HCC)   COPD (chronic obstructive pulmonary disease) (HCC)   Chronic diastolic CHF (congestive heart failure) (HCC)   Influenza  Principal Problem:    Acute respiratory failure with hypoxia (HCC)-due to influenza A  -08/09/2021-worsening respiratory failure, hypoxia  - 12/12 -status post intubation -on the vent -12/12 -left IJ placed  On vent PRVC 18/5/10 0.5/5 -FiO2 100%-90-75 ABG: 7.4/39.4/120/12.5  -Subsequent ABGs chest x-rays reviewed -Continue high-dose IV steroid Solu-Medrol 80 mg twice daily - DuoNeb bronchodilators, mucolytic, -Continue Tamiflu  -Symptom may be superimposed by COPD, CHF, OSA,--although no signs of exacerbation (BNP 75 )  PCCM Dr. Melvyn Novas consulted appreciate his close follow-up    Sepsis/SIRS -Met sepsis criteria-due to respiratory failure, fever, leukocytosis, tachycardic, tachypneic with lactic acidosis - T-max yesterday 101.3, lactic acid 2.4, 1.4, Pro-Cal<0.10 -underlying source influenza A, cannot rule out superimposed infection at this time -  Status post IV fluid resuscitation, continue antiviral Tamiflu -Empiric IV antibiotics have been initiated 08/09/2021--may be de-escalated as patient status improves -Blood cultures obtained 08/09/2021 -Urinalysis negative for any leukocyte Estrace WBC 0-5   A. fib with RVR -Cardiologist Dr. Domenic Polite consulted, following closely, Per recommendation patient remains on amiodarone drip -Lovenox therapeutic dose initiated  Acute renal insufficiency -Baseline creatinine around 0.89, Cr. On admission>> 1.43 >>> 1.08 >> 0.93, 0.92 -Holding ACE inhibitor's and Lasix -We will try to avoid nephrotoxins -Monitor kidney function  closely   Active Problems:  Chronic atrial fibrillation (HCC) -home medication Lopressor and Eliquis  Tobacco abuse -advise regarding smoking cessation, declined NicoDerm patch  Obstructive sleep apnea -currently intubated on the vent    Essential hypertension -holding home BP meds    Non-insulin treated type 2 diabetes mellitus (HCC) -CBG every 4 hours    COPD (chronic obstructive pulmonary disease) (HCC) -management as above   Chronic diastolic CHF (congestive heart failure) (HCC) -monitoring daily weights, I's and O's, holding Lasix   ------------------------------------------------------------------------------------------------------------------------------------------ Cultures; Blood Cultures x 2 >> NGT Sputum cultures >>   Antimicrobials: Tamiflu Cefepime 08/09/2021  Consultants: None  -------------------------------------------------------------------------------------------------------------------------------------  DVT prophylaxis:  SCD/Compression stockings and HO:ZYYQMGN Code Status:   Code Status: Full Code  Family Communication: Discussed with daughters, wife at bedside The above findings and plan of care has been discussed with patient (and family)  in detail,  they expressed understanding and agreement of above. -Advance care planning has been discussed.   Admission status:   Status is: Inpatient  Remains inpatient appropriate because: In acute respiratory failure, meeting sepsis criteria, intubated, on vent, hypotensive, A. fib with RVR... Requiring vent management, blood pressure management, heart rate management, treating underlying infection..   Level of care: ICU   Procedures:   No admission procedures for hospital encounter.    Antimicrobials:  Anti-infectives (From admission, onward)    Start     Dose/Rate Route Frequency Ordered Stop   08/10/21 0015  oseltamivir (TAMIFLU) capsule 75 mg        75 mg Per Tube 2 times daily 08/09/21  2316 08/11/21 2159   08/09/21 1745  ceFEPIme (MAXIPIME) 2 g in sodium chloride 0.9 % 100 mL IVPB  Status:  Discontinued        2 g 200 mL/hr over 30 Minutes Intravenous  Once 08/09/21 1657 08/09/21 1702   08/09/21 1730  ceFEPIme (MAXIPIME) 2 g in sodium chloride 0.9 % 100 mL IVPB        2 g 200 mL/hr over 30 Minutes Intravenous Every 8 hours 08/09/21 1702     08/07/21 0800  oseltamivir (TAMIFLU) capsule 75 mg  Status:  Discontinued        75 mg Oral 2 times daily 08/07/21 0711 08/09/21 2316        Medication:   ARIPiprazole  2 mg Per Tube Daily   atorvastatin  80 mg Per Tube Daily   chlorhexidine gluconate (MEDLINE KIT)  15 mL Mouth Rinse BID   Chlorhexidine Gluconate Cloth  6 each Topical Daily   enoxaparin (LOVENOX) injection  95 mg Subcutaneous Q12H   [START ON 08/12/2021] escitalopram  20 mg Per Tube Daily   insulin aspart  0-15 Units Subcutaneous Q4H   ipratropium  0.5 mg Nebulization BID   levalbuterol  1.25 mg Nebulization BID   mouth rinse  15 mL Mouth Rinse 10 times per day   methylPREDNISolone (SOLU-MEDROL) injection  80 mg Intravenous Q12H   mupirocin ointment   Nasal  BID   oseltamivir  75 mg Per Tube BID   pantoprazole (PROTONIX) IV  40 mg Intravenous Q24H    acetaminophen **OR** acetaminophen, levalbuterol, ondansetron **OR** ondansetron (ZOFRAN) IV   Objective:   Vitals:   08/10/21 1045 08/10/21 1100 08/10/21 1111 08/10/21 1115  BP: 132/75 (!) 152/88  120/67  Pulse: (!) 111 (!) 106 (!) 104 (!) 110  Resp: (!) 23 (!) 26 19 18   Temp:   98.1 F (36.7 C)   TempSrc:   Axillary   SpO2: 99% 100% 95% 94%  Weight:      Height:        Intake/Output Summary (Last 24 hours) at 08/10/2021 1139 Last data filed at 08/10/2021 1122 Gross per 24 hour  Intake 3037.11 ml  Output 1475 ml  Net 1562.11 ml   Filed Weights   08/09/21 0439 08/09/21 1456 08/10/21 0315  Weight: 95.3 kg 94 kg 92 kg     Examination:     Physical Exam:   General:  Sedated-intubated   HEENT:  ET tube in good position on the vent  Neuro:  Sedated on IV  propofol gtt   Lungs:   Positive air sounds throughout the lung fields, mild rhonchi diffusely no wheezing or crackles  Cardio:    Tachycardic A. fib on amiodarone drip, negative for  rubs or clicks  Abdomen:   Soft, hypoactive bowel sounds,  peritoneal signs.  Muscular skeletal:  Definite peripheral edema, positive pulses, sedated  Skin:  Dry, warm to touch, negative for any Rashes,  Wounds: Please see nursing documentation            -----------------------------------------------------------------------------------------------------------------------------    LABs:  CBC Latest Ref Rng & Units 08/10/2021 08/09/2021 08/09/2021  WBC 4.0 - 10.5 K/uL 15.2(H) 16.1(H) 13.4(H)  Hemoglobin 13.0 - 17.0 g/dL 12.6(L) 14.3 13.9  Hematocrit 39.0 - 52.0 % 37.9(L) 43.3 42.3  Platelets 150 - 400 K/uL 197 195 190   CMP Latest Ref Rng & Units 08/10/2021 08/09/2021 08/09/2021  Glucose 70 - 99 mg/dL 239(H) 223(H) 149(H)  BUN 8 - 23 mg/dL 29(H) 28(H) 28(H)  Creatinine 0.61 - 1.24 mg/dL 0.92 1.06 0.96  Sodium 135 - 145 mmol/L 131(L) 131(L) 131(L)  Potassium 3.5 - 5.1 mmol/L 3.9 5.0 4.6  Chloride 98 - 111 mmol/L 99 98 100  CO2 22 - 32 mmol/L 24 24 24   Calcium 8.9 - 10.3 mg/dL 7.9(L) 8.1(L) 8.3(L)  Total Protein 6.5 - 8.1 g/dL - 7.0 -  Total Bilirubin 0.3 - 1.2 mg/dL - 2.1(H) -  Alkaline Phos 38 - 126 U/L - 101 -  AST 15 - 41 U/L - 28 -  ALT 0 - 44 U/L - 47(H) -       Micro Results Recent Results (from the past 240 hour(s))  Resp Panel by RT-PCR (Flu A&B, Covid) Nasopharyngeal Swab     Status: Abnormal   Collection Time: 08/06/21  7:28 PM   Specimen: Nasopharyngeal Swab; Nasopharyngeal(NP) swabs in vial transport medium  Result Value Ref Range Status   SARS Coronavirus 2 by RT PCR NEGATIVE NEGATIVE Final    Comment: (NOTE) SARS-CoV-2 target nucleic acids are NOT DETECTED.  The SARS-CoV-2 RNA is generally detectable  in upper respiratory specimens during the acute phase of infection. The lowest concentration of SARS-CoV-2 viral copies this assay can detect is 138 copies/mL. A negative result does not preclude SARS-Cov-2 infection and should not be used as the sole basis for treatment or other patient management decisions. A negative  result may occur with  improper specimen collection/handling, submission of specimen other than nasopharyngeal swab, presence of viral mutation(s) within the areas targeted by this assay, and inadequate number of viral copies(<138 copies/mL). A negative result must be combined with clinical observations, patient history, and epidemiological information. The expected result is Negative.  Fact Sheet for Patients:  EntrepreneurPulse.com.au  Fact Sheet for Healthcare Providers:  IncredibleEmployment.be  This test is no t yet approved or cleared by the [place] FDA and  has been authorized for detection and/or diagnosis of SARS-CoV-2 by FDA under an Emergency Use Authorization (EUA). This EUA will remain  in effect (meaning this test can be used) for the duration of the COVID-19 declaration under Section 564(b)(1) of the Act, 21 U.S.C.section 360bbb-3(b)(1), unless the authorization is terminated  or revoked sooner.       Influenza A by PCR POSITIVE (A) NEGATIVE Final   Influenza B by PCR NEGATIVE NEGATIVE Final    Comment: (NOTE) The Xpert Xpress SARS-CoV-2/FLU/RSV plus assay is intended as an aid in the diagnosis of influenza from Nasopharyngeal swab specimens and should not be used as a sole basis for treatment. Nasal washings and aspirates are unacceptable for Xpert Xpress SARS-CoV-2/FLU/RSV testing.  Fact Sheet for Patients: EntrepreneurPulse.com.au  Fact Sheet for Healthcare Providers: IncredibleEmployment.be  This test is not yet approved or cleared by the [place] FDA  and has been authorized for detection and/or diagnosis of SARS-CoV-2 by FDA under an Emergency Use Authorization (EUA). This EUA will remain in effect (meaning this test can be used) for the duration of the COVID-19 declaration under Section 564(b)(1) of the Act, 21 U.S.C. section 360bbb-3(b)(1), unless the authorization is terminated or revoked.  Performed at [facility], [address], [place], [place] [address]   MRSA Next Gen by PCR, Nasal     Status: Abnormal   Collection Time: 08/09/21  3:03 PM   Specimen: Nasal Mucosa; Nasal Swab  Result Value Ref Range Status   MRSA by PCR Next Gen DETECTED (A) NOT DETECTED Final    Comment: RESULT CALLED TO, READ BACK BY AND VERIFIED WITH: KARA, RN @ [phone] ON 08/10/21 C VARNER (NOTE) The GeneXpert MRSA Assay (FDA approved for NASAL specimens only), is one component of a comprehensive MRSA colonization surveillance program. It is not intended to diagnose MRSA infection nor to guide or monitor treatment for MRSA infections. Test performance is not FDA approved in patients less than 62 years old. Performed at [facility], [address], [place], [place] [address]   Culture, blood (routine x 2)     Status: None (Preliminary result)   Collection Time: 08/09/21  5:22 PM   Specimen: BLOOD  Result Value Ref Range Status   Specimen Description BLOOD  Final   Special Requests NONE  Final   Culture   Final    NO GROWTH < 12 HOURS Performed at [facility], [address] [place], [place] [address]    Report Status PENDING  Incomplete  Culture, blood (routine x 2)     Status: None (Preliminary result)   Collection Time: 08/09/21  5:22 PM   Specimen: BLOOD  Result Value Ref Range Status   Specimen Description BLOOD  Final   Special Requests NONE  Final   Culture   Final    NO GROWTH < 12 HOURS Performed at [facility], [address], [place], [place] [address]    Report Status PENDING  Incomplete    Radiology Reports DG CHEST PORT  1  VIEW  Result Date: 08/10/2021 CLINICAL DATA:  Difficulty breathing EXAM: PORTABLE CHEST 1 VIEW COMPARISON:  Previous studies including the examination is done on 08/09/2021 FINDINGS: Transverse diameter of heart is increased. Central pulmonary vessels are slightly more prominent. Interstitial markings in the left parahilar region and lower lung fields appear more prominent. There is no focal consolidation. There is no significant pleural effusion or pneumothorax. Tip of endotracheal is 5.1 cm above the carina. Tip of central venous catheter is seen in the superior vena cava. Enteric tube is noted traversing the esophagus. Distal portion of the enteric tube is seen in the stomach. Tip is not included in the radiograph. IMPRESSION: Cardiomegaly. Interstitial markings in the parahilar regions and lower lung fields appear more prominent. This may suggest mild interstitial edema or interstitial pneumonitis or an artifact due to differences in the techniques. Electronically Signed   By: Elmer Picker M.D.   On: 08/10/2021 09:52   DG CHEST PORT 1 VIEW  Result Date: 08/09/2021 CLINICAL DATA:  Shortness of breath EXAM: PORTABLE CHEST 1 VIEW COMPARISON:  08/07/2021.  09/19/2018. FINDINGS: Chronic cardiomegaly and aortic atherosclerosis. Worsened venous hypertension, possibly with mild interstitial edema. No visible effusion. There could also be a bronchial thickening pattern consistent with bronchitis. IMPRESSION: Probable fluid overload/low level congestive heart failure. Cardiomegaly, venous hypertension and mild interstitial edema. Question also bronchial thickening that could go along with bronchitis. Electronically Signed   By: Nelson Chimes M.D.   On: 08/09/2021 13:17   Portable chest 1 View  Result Date: 08/07/2021 CLINICAL DATA:  65 year old male with respiratory failure.  Smoker. EXAM: PORTABLE CHEST 1 VIEW COMPARISON:  Portable chest 08/06/2021 and earlier. FINDINGS: Portable AP upright view at  0439 hours. Stable lung volumes and mediastinal contours. Borderline cardiomegaly. Stable lung markings. Allowing for portable technique the lungs are clear. No pneumothorax or pleural effusion. Visualized tracheal air column is within normal limits. No acute osseous abnormality identified. IMPRESSION: No acute cardiopulmonary abnormality. Electronically Signed   By: Genevie Ann M.D.   On: 08/07/2021 04:59   DG Chest Port 1 View  Result Date: 08/06/2021 CLINICAL DATA:  Shortness of breath EXAM: PORTABLE CHEST 1 VIEW COMPARISON:  09/21/2018 FINDINGS: Cardiomegaly. No confluent opacities, effusions or edema. No acute bony abnormality. IMPRESSION: Cardiomegaly.  No active disease. Electronically Signed   By: Rolm Baptise M.D.   On: 08/06/2021 19:45   DG Chest Port 1V same Day  Result Date: 08/09/2021 CLINICAL DATA:  Central line placement. EXAM: PORTABLE CHEST 1 VIEW COMPARISON:  Chest radiograph dated 08/09/2021. FINDINGS: Endotracheal tube with tip approximately 4.5 cm above the carina. Enteric tube extends below the diaphragm with tip in the left upper abdomen, likely in the proximal stomach. Left IJ central venous line with tip over central SVC. Mild cardiomegaly with mild vascular congestion. Left lung base density may represent atelectasis. Developing infiltrate is not excluded. No large pleural effusion. No pneumothorax. No acute osseous pathology. Degenerative changes of spine. IMPRESSION: 1. Endotracheal tube above the carina. 2. Left IJ central venous line with tip over central SVC. No pneumothorax. Mild cardiomegaly and vascular congestion. Electronically Signed   By: Anner Crete M.D.   On: 08/09/2021 19:59   DG Chest Port 1V same Day  Result Date: 08/09/2021 CLINICAL DATA:  65 year old male with intubation EXAM: PORTABLE CHEST 1 VIEW COMPARISON:  08/09/2021, 08/07/2021 FINDINGS: Cardiomediastinal silhouette unchanged in size and contour. No evidence of central vascular congestion. No  interlobular septal thickening. Interval placement of endotracheal  tube, terminating suitably above the carina, 3.9 cm. Interval placement of gastric tube projecting over the mediastinum and terminating within the left upper quadrant Low lung volumes with no confluent airspace disease. Coarsened interstitial markings similar to the recent comparison chest x-rays. No pneumothorax or pleural effusion. No acute displaced fracture. Similar appearance lytic changes at the distal right clavicle IMPRESSION: Interval placement of endotracheal tube, terminating suitably above the carina. Gastric tube terminates in the stomach. Low lung volumes with likely atelectasis at the bases Electronically Signed   By: Corrie Mckusick D.O.   On: 08/09/2021 16:28    SIGNED: Deatra James, MD, FHM. Critical care time >70 min was spent evaluating the patient, ordering labs, interpreting data labs medication, discussing plan of care with ICU team, PCCM, cardiology..  Family meeting  [facility],  Pager (please use [email or web address] to page/text) Please use Epic Secure Chat for non-urgent communication (7AM-7PM)  If 7PM-7AM, please contact night-coverage [email or web address], 08/10/2021, 11:39 AM

## 2021-08-10 NOTE — Progress Notes (Signed)
ANTICOAGULATION CONSULT NOTE - Initial Consult  Pharmacy Consult for lovenox Indication: atrial fibrillation  No Known Allergies  Patient Measurements: Height: 5\' 8"  (172.7 cm) Weight: 92 kg (202 lb 13.2 oz) IBW/kg (Calculated) : 68.4 Heparin Dosing Weight:   Vital Signs: Temp: 97.4 F (36.3 C) (12/13 0725) Temp Source: Axillary (12/13 0725) BP: 104/76 (12/13 0700) Pulse Rate: 143 (12/13 0725)  Labs: Recent Labs    08/09/21 0429 08/09/21 1623 08/09/21 1722 08/10/21 0350  HGB 13.9 14.3  --  12.6*  HCT 42.3 43.3  --  37.9*  PLT 190 195  --  197  APTT  --   --  24  --   LABPROT  --   --  17.9*  --   INR  --   --  1.5*  --   CREATININE 0.96 1.06  --  0.92  TROPONINIHS  --   --   --  4     Estimated Creatinine Clearance: 88.1 mL/min (by C-G formula based on SCr of 0.92 mg/dL).   Medical History: Past Medical History:  Diagnosis Date   Anemia    Anxiety    Asthma    CAD (coronary artery disease)    DES to distal circumflex 2016   Cataract    Colon polyps    30 colon polyps found on first colonoscopy   Diastolic heart failure (HCC)    Diverticulitis    DJD (degenerative joint disease)    GERD (gastroesophageal reflux disease)    History of kidney stones    Hyperlipidemia    Hypertension    Insomnia    Obstructive sleep apnea 12/2009   01/26/2010 AHI 83/hr   Permanent atrial fibrillation ([place])    Onset 2006 paroxysmal then progressive to persistent   PUD (peptic ulcer disease)    1980s   RLS (restless legs syndrome)    Sinusitis    Skin cancer    Type 2 diabetes mellitus (HCC)     Medications:  Medications Prior to Admission  Medication Sig Dispense Refill Last Dose   albuterol (VENTOLIN HFA) 108 (90 Base) MCG/ACT inhaler INHALE 2 PUFFS INTO THE LUNGS EVERY 6 HOURS AS NEEDED FOR WHEEZING OR SHORTNESS OF BREATH 18 g 11 08/06/2021   apixaban (ELIQUIS) 5 MG TABS tablet TAKE 1 TABLET(5 MG) BY MOUTH TWICE DAILY 180 tablet 1 08/05/2021 at 01200    ARIPiprazole (ABILIFY) 2 MG tablet Take 1 tablet (2 mg total) by mouth daily. 90 tablet 0 08/05/2021   atorvastatin (LIPITOR) 80 MG tablet Take 1 tablet (80 mg total) by mouth daily. TAKE 1 TABLET(80 MG) BY MOUTH EVERY EVENING 90 tablet 3 08/05/2021   benzonatate (TESSALON PERLES) 100 MG capsule Take 1 capsule (100 mg total) by mouth 3 (three) times daily as needed. 20 capsule 0 08/05/2021   buPROPion (WELLBUTRIN XL) 150 MG 24 hr tablet Take 3 tablets once daily (Patient taking differently: Take 150 mg by mouth 3 (three) times daily with meals.) 270 tablet 3 08/05/2021   docusate sodium (COLACE) 100 MG capsule Take 100 mg by mouth daily as needed for mild constipation.   08/05/2021   escitalopram (LEXAPRO) 20 MG tablet TAKE 1 TABLET(20 MG) BY MOUTH DAILY 90 tablet 0 08/05/2021   ezetimibe (ZETIA) 10 MG tablet TAKE 1 TABLET BY MOUTH EVERY DAY 90 tablet 3 08/05/2021   ferrous sulfate 325 (65 FE) MG tablet Take 325 mg by mouth daily with breakfast.   08/05/2021   furosemide (LASIX) 40 MG tablet Take  1 tablet (40 mg total) by mouth daily. 90 tablet 3 08/05/2021   lisinopril (ZESTRIL) 2.5 MG tablet TAKE 1 TABLET(2.5 MG) BY MOUTH DAILY. 90 tablet 3 08/05/2021   meclizine (ANTIVERT) 25 MG tablet Take 1 tablet (25 mg total) by mouth 3 (three) times daily as needed for dizziness. 30 tablet 0 unknown   metFORMIN (GLUCOPHAGE) 500 MG tablet TAKE ONE TABLET (500 MG) BY MOUTH TWICE DAILY WITH A MEAL. 180 tablet 0 08/05/2021   metoprolol tartrate (LOPRESSOR) 100 MG tablet Take 1 tablet (100 mg total) by mouth 2 (two) times daily. 180 tablet 0 08/05/2021 at 01200   nitroGLYCERIN (NITROSTAT) 0.4 MG SL tablet Place 1 tablet (0.4 mg total) under the tongue every 5 (five) minutes as needed. 25 tablet 3 unknown   omeprazole (PRILOSEC) 40 MG capsule Take 1 capsule by mouth once daily 30 minutes before breakfast 90 capsule 3 08/05/2021   ondansetron (ZOFRAN) 4 MG tablet Take 1 tablet (4 mg total) by mouth every 8 (eight) hours as needed  for nausea or vomiting. 30 tablet 1 unknown   potassium chloride SA (KLOR-CON) 20 MEQ tablet Take 1 tablet (20 mEq total) by mouth daily. 90 tablet 3 08/05/2021   rOPINIRole (REQUIP) 3 MG tablet TAKE 1 TABLET BY MOUTH EVERY NIGHT AT BEDTIME 450 tablet 0 08/05/2021   sildenafil (VIAGRA) 100 MG tablet TAKE 1 TABLET BY MOUTH 30 MINUTES BEFORE ACTIVITY 8 tablet 0 unknown   sucralfate (CARAFATE) 1 g tablet Take one tablet po BID PRN (Patient taking differently: Take 1 g by mouth 2 (two) times daily as needed (acid reflux).) 42 tablet 5 08/05/2021   traMADol (ULTRAM) 50 MG tablet Take 1 tablet (50 mg total) by mouth twice daily as needed for moderate pain 60 tablet 4 08/05/2021   traZODone (DESYREL) 50 MG tablet TAKE 2 TABLETS (100 MG) BY MOUTH AT BEDTIME 180 tablet 1 08/05/2021   pseudoephedrine (SUDAFED) 30 MG tablet Take 30 mg by mouth every 6 (six) hours as needed for congestion. (Patient not taking: Reported on 08/06/2021)   Not Taking    Assessment: Pharmacy consulted to dose lovenox in patient with atrial fibrillation.  Patient is on apixaban prior to admission with last dose given 12/12 0944.    Hgb 12.6 plt WNL  Goal of Therapy:   Monitor platelets by anticoagulation protocol: Yes   Plan:  Continue lovenox 95 mg subq every 12 hours Monitor H&H and platelets.  Thomasenia Sales, PharmD, [facility] Clinical Pharmacist  08/10/2021 7:58 AM

## 2021-08-10 NOTE — Progress Notes (Signed)
Date and time results received: 08/10/21 0730 (use smartphrase ".now" to insert current time)  Test: MRSA of nares Critical Value: Positive  Name of Provider Notified: Dr Roger Shelter  Orders Received? Or Actions Taken?: Actions Taken: ordered [place]

## 2021-08-10 NOTE — Progress Notes (Signed)
Wake up assessment completed this AM, patient opened eyes, nodded yes/no and followed commands. Patient able to move extremities and squeezed both hands. Patient started coughing and became a little restless, sedation resumed. Family at bedside and educated and expressed understanding. Patient appearing to be comfortable at this time.

## 2021-08-10 NOTE — Progress Notes (Signed)
[facility] Progress Note Patient Name: Shaun [name] DOB: May 23, 1956 MRN: [id number]   Date of Service  08/10/2021  HPI/Events of Note  Patient needs AM ABG.  eICU Interventions  ABG ordered.        Kerry Kass Sherril Heyward 08/10/2021, 6:39 AM

## 2021-08-10 NOTE — Progress Notes (Signed)
*  PRELIMINARY RESULTS* Echocardiogram 2D Echocardiogram has been performed.  Samuel Germany 08/10/2021, 4:48 PM

## 2021-08-10 NOTE — Progress Notes (Signed)
Inpatient Diabetes Program Recommendations  AACE/ADA: New Consensus Statement on Inpatient Glycemic Control   Target Ranges:  Prepandial:   less than 140 mg/dL      Peak postprandial:   less than 180 mg/dL (1-2 hours)      Critically ill patients:  140 - 180 mg/dL    Latest Reference Range & Units 08/10/21 00:03 08/10/21 03:42 08/10/21 07:29  Glucose-Capillary 70 - 99 mg/dL 245 (H) 229 (H) 211 (H)    Latest Reference Range & Units 08/09/21 08:02 08/09/21 11:39 08/09/21 16:22 08/09/21 20:00  Glucose-Capillary 70 - 99 mg/dL 135 (H) 137 (H) 247 (H) 234 (H)   Review of Glycemic Control  Diabetes history: DM2 Outpatient Diabetes medications: Metformin 500 mg BID Current orders for Inpatient glycemic control: Novolog 0-15 units Q4H; Solumedrol 80 mg Q12H  Inpatient Diabetes Program Recommendations:    Insulin: If steroids are continued as ordered, please consider ordering Semglee 9 units Q24H.   Thanks, Barnie Alderman, RN, MSN, CDE Diabetes Coordinator Inpatient Diabetes Program [phone] (Team Pager from 8am to 5pm)

## 2021-08-10 NOTE — Progress Notes (Signed)
Patient appearing to be comfortable in bed. Responds to calling his name and tapping arm by nodding yes when asked if he was ok and briefly opened eyes.

## 2021-08-10 NOTE — Progress Notes (Signed)
Initial Nutrition Assessment  DOCUMENTATION CODES:   Obesity unspecified  INTERVENTION:   If pt unable to wean 24-48 hr recommend:  -Initiate Vital 1.2 @ 40 ml/hr via OGT and increase by 10 ml every 6 hours to goal rate of 65 ml/hr.   -Add 45 ml ProSource TF BID per OGT.    Tube feeding regimen provides 1952 kcal, 139 grams of protein, and 1265 ml of H2O.     NUTRITION DIAGNOSIS:   Inadequate oral intake related to inability to eat as evidenced by NPO status.   GOAL:  Provide needs based on ASPEN/SCCM guidelines   MONITOR:  Vent status, TF, I/O's   REASON FOR ASSESSMENT:   Ventilator    ASSESSMENT: Patient is a 65 yo with history of CHF, HTN, DM2, anxiety who reports with dyspnea. Patient and spouse have the flu per nursing.   Cardiomegaly per MD. Central line placed.   Patient has an NG/OG vented Dual Lumen Oral external length of 62 cm. Low intermittent suction (50 ml- bile, green OP). Lopez valve changed.  Patient intubated on ventilator support 12/12 @ 1558. MAP-78. Discussed pt with nursing.   MV: 10.8 L/min Temp (24hrs), Avg:98.7 F (37.1 C), Min:97.4 F (36.3 C), Max:101.3 F (38.5 C)  Fentanyl: 10 ml/hr   Medications: lipitor, lexapro, insulin, solumedrol, protonix.    Intake/Output Summary (Last 24 hours) at 08/10/2021 1152 Last data filed at 08/10/2021 1122 Gross per 24 hour  Intake 3037.11 ml  Output 1475 ml  Net 1562.11 ml    Weights fluctuating between 203-210 lb this hospitalization.    Labs: BMP Latest Ref Rng & Units 08/10/2021 08/09/2021 08/09/2021  Glucose 70 - 99 mg/dL 239(H) 223(H) 149(H)  BUN 8 - 23 mg/dL 29(H) 28(H) 28(H)  Creatinine 0.61 - 1.24 mg/dL 0.92 1.06 0.96  BUN/Creat Ratio 10 - 24 - - -  Sodium 135 - 145 mmol/L 131(L) 131(L) 131(L)  Potassium 3.5 - 5.1 mmol/L 3.9 5.0 4.6  Chloride 98 - 111 mmol/L 99 98 100  CO2 22 - 32 mmol/L 24 24 24   Calcium 8.9 - 10.3 mg/dL 7.9(L) 8.1(L) 8.3(L)      NUTRITION - FOCUSED  PHYSICAL EXAM: Unable to complete Nutrition-Focused physical exam at this time.     Diet Order:   Diet Order             Diet NPO time specified  Diet effective now                   EDUCATION NEEDS:  Not appropriate for education at this time  Skin:  Skin Assessment: Reviewed RN Assessment  Last BM:  12/11  Height:   Ht Readings from Last 1 Encounters:  08/09/21 5\' 8"  (1.727 m)    Weight:   Wt Readings from Last 1 Encounters:  08/10/21 92 kg    Ideal Body Weight:   70 kg   BMI:  Body mass index is 30.84 kg/m.  Estimated Nutritional Needs:   Kcal:  1700-1900  Protein:  135-143  Fluid:  < 2liters daily  Colman Cater MS,RD,CSG,LDN Contact: AMION

## 2021-08-11 DIAGNOSIS — J111 Influenza due to unidentified influenza virus with other respiratory manifestations: Secondary | ICD-10-CM

## 2021-08-11 DIAGNOSIS — J9601 Acute respiratory failure with hypoxia: Secondary | ICD-10-CM | POA: Diagnosis not present

## 2021-08-11 DIAGNOSIS — Z978 Presence of other specified devices: Secondary | ICD-10-CM | POA: Diagnosis not present

## 2021-08-11 LAB — BLOOD GAS, ARTERIAL
Acid-Base Excess: 2.1 mmol/L — ABNORMAL HIGH (ref 0.0–2.0)
Acid-Base Excess: 1 mmol/L (ref 0.0–2.0)
Bicarbonate: 25.7 mmol/L (ref 20.0–28.0)
Bicarbonate: 24.7 mmol/L (ref 20.0–28.0)
Drawn by: 41977
Drawn by: 41977
FIO2: 75
FIO2: 75
O2 Saturation: 93.4 %
O2 Saturation: 93.7 %
Patient temperature: 37
Patient temperature: 37
pCO2 arterial: 47.3 mmHg (ref 32.0–48.0)
pCO2 arterial: 48 mmHg (ref 32.0–48.0)
pH, Arterial: 7.372 (ref 7.350–7.450)
pH, Arterial: 7.353 (ref 7.350–7.450)
pO2, Arterial: 76.8 mmHg — ABNORMAL LOW (ref 83.0–108.0)
pO2, Arterial: 78.9 mmHg — ABNORMAL LOW (ref 83.0–108.0)

## 2021-08-11 LAB — CBC
HCT: 37 % — ABNORMAL LOW (ref 39.0–52.0)
Hemoglobin: 12.1 g/dL — ABNORMAL LOW (ref 13.0–17.0)
MCH: 28.6 pg (ref 26.0–34.0)
MCHC: 32.7 g/dL (ref 30.0–36.0)
MCV: 87.5 fL (ref 80.0–100.0)
Platelets: 182 10*3/uL (ref 150–400)
RBC: 4.23 MIL/uL (ref 4.22–5.81)
RDW: 14 % (ref 11.5–15.5)
WBC: 17.4 10*3/uL — ABNORMAL HIGH (ref 4.0–10.5)
nRBC: 0 % (ref 0.0–0.2)

## 2021-08-11 LAB — MAGNESIUM: Magnesium: 2.4 mg/dL (ref 1.7–2.4)

## 2021-08-11 LAB — COMPREHENSIVE METABOLIC PANEL
ALT: 31 U/L (ref 0–44)
AST: 15 U/L (ref 15–41)
Albumin: 2.5 g/dL — ABNORMAL LOW (ref 3.5–5.0)
Alkaline Phosphatase: 75 U/L (ref 38–126)
Anion gap: 9 (ref 5–15)
BUN: 40 mg/dL — ABNORMAL HIGH (ref 8–23)
CO2: 25 mmol/L (ref 22–32)
Calcium: 7.9 mg/dL — ABNORMAL LOW (ref 8.9–10.3)
Chloride: 100 mmol/L (ref 98–111)
Creatinine, Ser: 0.97 mg/dL (ref 0.61–1.24)
GFR, Estimated: >60 mL/min (ref >60)
Glucose, Bld: 208 mg/dL — ABNORMAL HIGH (ref 70–99)
Potassium: 4.2 mmol/L (ref 3.5–5.1)
Sodium: 134 mmol/L — ABNORMAL LOW (ref 135–145)
Total Bilirubin: 0.7 mg/dL (ref 0.3–1.2)
Total Protein: 5.7 g/dL — ABNORMAL LOW (ref 6.5–8.1)

## 2021-08-11 LAB — GLUCOSE, CAPILLARY
Glucose-Capillary: 181 mg/dL — ABNORMAL HIGH (ref 70–99)
Glucose-Capillary: 181 mg/dL — ABNORMAL HIGH (ref 70–99)
Glucose-Capillary: 201 mg/dL — ABNORMAL HIGH (ref 70–99)
Glucose-Capillary: 202 mg/dL — ABNORMAL HIGH (ref 70–99)
Glucose-Capillary: 211 mg/dL — ABNORMAL HIGH (ref 70–99)
Glucose-Capillary: 237 mg/dL — ABNORMAL HIGH (ref 70–99)
Glucose-Capillary: 242 mg/dL — ABNORMAL HIGH (ref 70–99)

## 2021-08-11 LAB — PHOSPHORUS: Phosphorus: 3.8 mg/dL (ref 2.5–4.6)

## 2021-08-11 MED ORDER — FUROSEMIDE 10 MG/ML IJ SOLN
40.0000 mg | Freq: Once | INTRAMUSCULAR | Status: AC
  Administered 2021-08-11: 10:00:00 40 mg via INTRAVENOUS
  Filled 2021-08-11: qty 4

## 2021-08-11 MED ORDER — ACETYLCYSTEINE 20 % IN SOLN
2.0000 mL | Freq: Four times a day (QID) | RESPIRATORY_TRACT | Status: DC
Start: 2021-08-11 — End: 2021-08-13
  Administered 2021-08-11 – 2021-08-13 (×9): 2 mL via RESPIRATORY_TRACT
  Filled 2021-08-11 (×9): qty 4

## 2021-08-11 MED ORDER — SODIUM CHLORIDE 0.9% FLUSH
10.0000 mL | Freq: Two times a day (BID) | INTRAVENOUS | Status: DC
  Administered 2021-08-11 – 2021-08-19 (×16): 10 mL

## 2021-08-11 MED ORDER — LEVALBUTEROL HCL 1.25 MG/0.5ML IN NEBU
1.2500 mg | INHALATION_SOLUTION | Freq: Four times a day (QID) | RESPIRATORY_TRACT | Status: DC
Start: 2021-08-11 — End: 2021-08-21
  Administered 2021-08-11 – 2021-08-21 (×39): 1.25 mg via RESPIRATORY_TRACT
  Filled 2021-08-11 (×40): qty 0.5

## 2021-08-11 MED ORDER — DOCUSATE SODIUM 100 MG PO CAPS: 100.0000 mg | ORAL_CAPSULE | Freq: Two times a day (BID) | ORAL | Status: DC | PRN

## 2021-08-11 MED ORDER — VITAL AF 1.2 CAL PO LIQD
1000.0000 mL | ORAL | Status: DC
Start: 2021-08-11 — End: 2021-08-12
  Administered 2021-08-11: 18:00:00 1000 mL

## 2021-08-11 MED ORDER — BISACODYL 5 MG PO TBEC: 5.0000 mg | DELAYED_RELEASE_TABLET | Freq: Every day | ORAL | Status: DC | PRN

## 2021-08-11 MED ORDER — METHYLPREDNISOLONE SODIUM SUCC 40 MG IJ SOLR
40.0000 mg | Freq: Two times a day (BID) | INTRAMUSCULAR | Status: DC
Start: 2021-08-11 — End: 2021-08-15
  Administered 2021-08-11 – 2021-08-15 (×8): 40 mg via INTRAVENOUS
  Filled 2021-08-11 (×8): qty 1

## 2021-08-11 MED ORDER — PROSOURCE TF PO LIQD
45.0000 mL | Freq: Two times a day (BID) | ORAL | Status: DC
Start: 2021-08-11 — End: 2021-08-19
  Administered 2021-08-11 – 2021-08-18 (×15): 45 mL
  Filled 2021-08-11 (×15): qty 45

## 2021-08-11 MED ORDER — DOCUSATE SODIUM 50 MG/5ML PO LIQD
100.0000 mg | Freq: Two times a day (BID) | ORAL | Status: DC
Start: 2021-08-11 — End: 2021-08-19
  Administered 2021-08-11 – 2021-08-18 (×16): 100 mg
  Filled 2021-08-11 (×16): qty 10

## 2021-08-11 MED ORDER — BISACODYL 10 MG RE SUPP: 10.0000 mg | Freq: Every day | RECTAL | Status: DC | PRN

## 2021-08-11 MED ORDER — SODIUM CHLORIDE 0.9% FLUSH: 10.0000 mL | INTRAVENOUS | Status: DC | PRN

## 2021-08-11 NOTE — Progress Notes (Signed)
°  Transition of Care (TOC) Screening Note   Patient Details  Name: Shaun [name] Date of Birth: 1956/05/07   Transition of Care [facility]) CM/SW Contact:    Boneta Lucks, RN Phone Number: 08/11/2021, 4:12 PM  Patient intubated   Transition of Care Department [facility]) has reviewed patient and no TOC needs have been identified at this time. We will continue to monitor patient advancement through interdisciplinary progression rounds. If new patient transition needs arise, please place a TOC consult.

## 2021-08-11 NOTE — Progress Notes (Signed)
Progress Note  Patient Name: Shaun [name] Date of Encounter: 08/11/2021  Primary Cardiologist: Kirk Ruths, MD  Subjective   Remains sedated and appears comfortable on the ventilator.  Inpatient Medications    Scheduled Meds:  ARIPiprazole  2 mg Per Tube Daily   atorvastatin  80 mg Per Tube Daily   chlorhexidine gluconate (MEDLINE KIT)  15 mL Mouth Rinse BID   Chlorhexidine Gluconate Cloth  6 each Topical Daily   docusate  100 mg Per Tube BID   enoxaparin (LOVENOX) injection  95 mg Subcutaneous Q12H   [START ON 08/12/2021] escitalopram  20 mg Per Tube Daily   insulin aspart  0-15 Units Subcutaneous Q4H   ipratropium  0.5 mg Nebulization BID   levalbuterol  1.25 mg Nebulization BID   mouth rinse  15 mL Mouth Rinse 10 times per day   methylPREDNISolone (SOLU-MEDROL) injection  80 mg Intravenous Q12H   mupirocin ointment   Nasal BID   oseltamivir  75 mg Per Tube BID   pantoprazole (PROTONIX) IV  40 mg Intravenous Q24H   sodium chloride flush  10-40 mL Intracatheter Q12H   Continuous Infusions:  amiodarone 30 mg/hr (08/11/21 0700)   ceFEPime (MAXIPIME) IV Stopped (08/11/21 0115)   fentaNYL infusion INTRAVENOUS 100 mcg/hr (08/11/21 0700)   midazolam 2 mg/hr (08/11/21 0700)   phenylephrine (NEO-SYNEPHRINE) Adult infusion Stopped (08/11/21 0538)   PRN Meds: acetaminophen **OR** acetaminophen, bisacodyl, levalbuterol, ondansetron **OR** ondansetron (ZOFRAN) IV, sodium chloride flush   Vital Signs    Vitals:   08/11/21 0700 08/11/21 0730 08/11/21 0742 08/11/21 0807  BP: (!) 109/59 112/70    Pulse: 84 85 80   Resp: 17 18 18    Temp:   97.6 F (36.4 C)   TempSrc:   Axillary   SpO2: 92% 93% 92% 92%  Weight:      Height:        Intake/Output Summary (Last 24 hours) at 08/11/2021 0850 Last data filed at 08/11/2021 0700 Gross per 24 hour  Intake 1256.5 ml  Output 1270 ml  Net -13.5 ml   Filed Weights   08/09/21 1456 08/10/21 0315 08/11/21 0400  Weight: 94  kg 92 kg 91.7 kg    Telemetry    Atrial fibrillation.  Personally reviewed.  ECG    An ECG dated 08/10/2021 was personally reviewed today and demonstrated:  Atrial fibrillation.  Physical Exam   GEN: No acute distress.   Neck: No JVD. Cardiac: Irregularly irregular without gallop or rub.  Respiratory: Nonlabored.  No wheezing anteriorly. GI: Soft, nontender, bowel sounds present. MS: No pitting edema. Neuro: Sedated. Psych: Sedated.  Labs    Chemistry Recent Labs  Lab 08/07/21 0556 08/08/21 0423 08/09/21 1623 08/10/21 0350 08/11/21 0356  NA 134*   < > 131* 131* 134*  K 5.0   < > 5.0 3.9 4.2  CL 102   < > 98 99 100  CO2 26   < > 24 24 25   GLUCOSE 194*   < > 223* 239* 208*  BUN 24*   < > 28* 29* 40*  CREATININE 1.08   < > 1.06 0.92 0.97  CALCIUM 8.6*   < > 8.1* 7.9* 7.9*  PROT 6.9  --  7.0  --  5.7*  ALBUMIN 3.4*  --  3.3*  --  2.5*  AST 24  --  28  --  15  ALT 50*  --  47*  --  31  ALKPHOS 96  --  101  --  75  BILITOT 1.0  --  2.1*  --  0.7  GFRNONAA >60   < > >60 >60 >60  ANIONGAP 6   < > 9 8 9    < > = values in this interval not displayed.     Hematology Recent Labs  Lab 08/09/21 1623 08/10/21 0350 08/11/21 0356  WBC 16.1* 15.2* 17.4*  RBC 5.00 4.40 4.23  HGB 14.3 12.6* 12.1*  HCT 43.3 37.9* 37.0*  MCV 86.6 86.1 87.5  MCH 28.6 28.6 28.6  MCHC 33.0 33.2 32.7  RDW 13.8 13.9 14.0  PLT 195 197 182    Cardiac Enzymes Recent Labs  Lab 08/06/21 2140 08/06/21 2348 08/10/21 0350  TROPONINIHS 4 4 4     BNP Recent Labs  Lab 08/06/21 1908 08/09/21 0429  BNP 75.0 78.0     Radiology    DG CHEST PORT 1 VIEW  Result Date: 08/11/2021 CLINICAL DATA:  Hypoxia and respiratory failure. EXAM: PORTABLE CHEST 1 VIEW COMPARISON:  Portable chest earlier today at 6:25 a.m. FINDINGS: 08/10/2021 at 11:44 p.m. the heart is enlarged. Central vessels are normal caliber. Emphysematous and chronic interstitial changes are present. Again noted is a small  infiltrate in the right upper lobe apex, and hazy atelectasis or pneumonitis in the right base. On the current exam there is increasingly dense left lower lobe consolidation, and small increased left pleural effusion. No other new opacity is seen. ETT tip is 5 cm from the carina, NGT tip in the body of stomach based on the positioning of the proximal side hole with the tip not included, left IJ central line tip in the SVC. IMPRESSION: 1. Increasingly dense left lower lobe consolidation and small left pleural effusion. 2. Unchanged small infiltrate in the right apex, with hazy right basilar atelectasis or pneumonitis. 3. Cardiomegaly without findings of CHF. Electronically Signed   By: Telford Nab M.D.   On: 08/11/2021 00:17   DG CHEST PORT 1 VIEW  Result Date: 08/10/2021 CLINICAL DATA:  Difficulty breathing EXAM: PORTABLE CHEST 1 VIEW COMPARISON:  Previous studies including the examination is done on 08/09/2021 FINDINGS: Transverse diameter of heart is increased. Central pulmonary vessels are slightly more prominent. Interstitial markings in the left parahilar region and lower lung fields appear more prominent. There is no focal consolidation. There is no significant pleural effusion or pneumothorax. Tip of endotracheal is 5.1 cm above the carina. Tip of central venous catheter is seen in the superior vena cava. Enteric tube is noted traversing the esophagus. Distal portion of the enteric tube is seen in the stomach. Tip is not included in the radiograph. IMPRESSION: Cardiomegaly. Interstitial markings in the parahilar regions and lower lung fields appear more prominent. This may suggest mild interstitial edema or interstitial pneumonitis or an artifact due to differences in the techniques. Electronically Signed   By: Elmer Picker M.D.   On: 08/10/2021 09:52   DG CHEST PORT 1 VIEW  Result Date: 08/09/2021 CLINICAL DATA:  Shortness of breath EXAM: PORTABLE CHEST 1 VIEW COMPARISON:  08/07/2021.   09/19/2018. FINDINGS: Chronic cardiomegaly and aortic atherosclerosis. Worsened venous hypertension, possibly with mild interstitial edema. No visible effusion. There could also be a bronchial thickening pattern consistent with bronchitis. IMPRESSION: Probable fluid overload/low level congestive heart failure. Cardiomegaly, venous hypertension and mild interstitial edema. Question also bronchial thickening that could go along with bronchitis. Electronically Signed   By: Nelson Chimes M.D.   On: 08/09/2021 13:17   DG Chest Port 1V  same Day  Result Date: 08/09/2021 CLINICAL DATA:  Central line placement. EXAM: PORTABLE CHEST 1 VIEW COMPARISON:  Chest radiograph dated 08/09/2021. FINDINGS: Endotracheal tube with tip approximately 4.5 cm above the carina. Enteric tube extends below the diaphragm with tip in the left upper abdomen, likely in the proximal stomach. Left IJ central venous line with tip over central SVC. Mild cardiomegaly with mild vascular congestion. Left lung base density may represent atelectasis. Developing infiltrate is not excluded. No large pleural effusion. No pneumothorax. No acute osseous pathology. Degenerative changes of spine. IMPRESSION: 1. Endotracheal tube above the carina. 2. Left IJ central venous line with tip over central SVC. No pneumothorax. Mild cardiomegaly and vascular congestion. Electronically Signed   By: Anner Crete M.D.   On: 08/09/2021 19:59   DG Chest Port 1V same Day  Result Date: 08/09/2021 CLINICAL DATA:  65 year old male with intubation EXAM: PORTABLE CHEST 1 VIEW COMPARISON:  08/09/2021, 08/07/2021 FINDINGS: Cardiomediastinal silhouette unchanged in size and contour. No evidence of central vascular congestion. No interlobular septal thickening. Interval placement of endotracheal tube, terminating suitably above the carina, 3.9 cm. Interval placement of gastric tube projecting over the mediastinum and terminating within the left upper quadrant Low lung  volumes with no confluent airspace disease. Coarsened interstitial markings similar to the recent comparison chest x-rays. No pneumothorax or pleural effusion. No acute displaced fracture. Similar appearance lytic changes at the distal right clavicle IMPRESSION: Interval placement of endotracheal tube, terminating suitably above the carina. Gastric tube terminates in the stomach. Low lung volumes with likely atelectasis at the bases Electronically Signed   By: Corrie Mckusick D.O.   On: 08/09/2021 16:28   ECHOCARDIOGRAM COMPLETE  Result Date: 08/10/2021    ECHOCARDIOGRAM REPORT   Patient Name:   Shaun [name] Date of Exam: 08/10/2021 Medical Rec #:  [id number]          Height:       68.0 in Accession #:    [id number]         Weight:       202.8 lb Date of Birth:  Mar 15, 1956         BSA:          2.056 m Patient Age:    65 years           BP:           106/68 mmHg Patient Gender: M                  HR:           97 bpm. Exam Location:  [facility] Procedure: 2D Echo, Cardiac Doppler and Color Doppler Indications:    Atrial Fibrillation I48.91  History:        Patient has prior history of Echocardiogram examinations, most                 recent 04/08/2019. CHF, CAD; Risk Factors:Hypertension and                 Dyslipidemia. Sleep Apnea.  Sonographer:    Alvino Chapel RCS Referring Phys: [place] Comments: Patient on mechanical ventilator at time of echo exam. IMPRESSIONS  1. Left ventricular ejection fraction, by estimation, is 60 to 65%. The left ventricle has normal function. The left ventricle has no regional wall motion abnormalities. There is moderate concentric left ventricular hypertrophy. Left ventricular diastolic parameters are indeterminate.  2. Right ventricular systolic function is normal. The right  ventricular size is normal. Tricuspid regurgitation signal is inadequate for assessing PA pressure.  3. Left atrial size was mildly dilated.  4. The mitral valve is grossly  normal. Trivial mitral valve regurgitation.  5. The aortic valve is tricuspid. Aortic valve regurgitation is not visualized.  6. Unable to estimate CVP. Comparison(s): Prior images reviewed side by side. LVEF stable at 60-65%. FINDINGS  Left Ventricle: Left ventricular ejection fraction, by estimation, is 60 to 65%. The left ventricle has normal function. The left ventricle has no regional wall motion abnormalities. The left ventricular internal cavity size was normal in size. There is  moderate concentric left ventricular hypertrophy. Left ventricular diastolic function could not be evaluated due to atrial fibrillation. Left ventricular diastolic parameters are indeterminate. Right Ventricle: The right ventricular size is normal. No increase in right ventricular wall thickness. Right ventricular systolic function is normal. Tricuspid regurgitation signal is inadequate for assessing PA pressure. Left Atrium: Left atrial size was mildly dilated. Right Atrium: Right atrial size was normal in size. Pericardium: There is no evidence of pericardial effusion. Presence of epicardial fat layer. Mitral Valve: The mitral valve is grossly normal. Trivial mitral valve regurgitation. Tricuspid Valve: The tricuspid valve is grossly normal. Tricuspid valve regurgitation is trivial. Aortic Valve: The aortic valve is tricuspid. There is mild to moderate aortic valve annular calcification. Aortic valve regurgitation is not visualized. Pulmonic Valve: The pulmonic valve was not well visualized. Pulmonic valve regurgitation is trivial. Aorta: The aortic root is normal in size and structure. Venous: Unable to estimate CVP. IVC assessment for right atrial pressure unable to be performed due to mechanical ventilation. IAS/Shunts: No atrial level shunt detected by color flow Doppler.  LEFT VENTRICLE PLAX 2D LVIDd:         4.60 cm LVIDs:         2.90 cm LV PW:         1.30 cm LV IVS:        1.40 cm LVOT diam:     1.90 cm LV SV:         46  LV SV Index:   22 LVOT Area:     2.84 cm  RIGHT VENTRICLE RV S prime:     10.70 cm/s TAPSE (M-mode): 1.8 cm LEFT ATRIUM             Index        RIGHT ATRIUM           Index LA diam:        4.10 cm 1.99 cm/m   RA Area:     18.50 cm LA Vol (A2C):   84.4 ml 41.05 ml/m  RA Volume:   52.00 ml  25.29 ml/m LA Vol (A4C):   75.7 ml 36.82 ml/m LA Biplane Vol: 79.8 ml 38.81 ml/m  AORTIC VALVE LVOT Vmax:   91.70 cm/s LVOT Vmean:  58.300 cm/s LVOT VTI:    0.161 m  AORTA Ao Root diam: 3.30 cm MITRAL VALVE MV Area (PHT): 4.15 cm    SHUNTS MV Decel Time: 183 msec    Systemic VTI:  0.16 m MV E velocity: 86.40 cm/s  Systemic Diam: 1.90 cm Rozann Lesches MD Electronically signed by Rozann Lesches MD Signature Date/Time: 08/10/2021/8:24:44 PM    Final     Assessment & Plan    1.  Permanent atrial fibrillation with CHA2DS2-VASc score of 4-5.  He has had recent RVR in the setting of acute hypoxic respiratory failure with influenza A.  He  continues on IV amiodarone for heart rate control, beta-blocker held at this point. Eliquis transitioned to Lovenox.  High-sensitivity troponin I levels normal.  Follow-up echocardiogram yesterday revealed preserved LVEF at 60 to 65% with moderate LVH.  2.  Hypoxic respiratory failure now with ventilator requirement in the setting of influenza A and possibly associated acute on chronic diastolic heart failure.  Mild vascular congestion by chest x-ray.  Recent BNP 78.  Currently on empiric antibiotics with Maxipime, so Tamiflu.  Not requiring pressors at this time.  Intake and output relatively even last 24 hours.  3.  History of CAD status post DES to the distal circumflex in 2016, patent at angiography in 2018 with mild to moderate residual CAD.  Continue Lipitor, Lopressor presently on hold.  Continue IV amiodarone, Lovenox, and eventually work back toward addition of beta-blocker depending on clinical course and blood pressure.  Will give another dose of Lasix today and try to  keep intake and output fairly matched.  Signed, Rozann Lesches, MD  08/11/2021, 8:50 AM

## 2021-08-11 NOTE — Progress Notes (Signed)
NAME:  Shaun [name], MRN:  [id number], DOB:  1956-02-21, LOS: 5 ADMISSION DATE:  08/06/2021, CONSULTATION DATE:  12/12 REFERRING MD:  Richarda Blade, CHIEF COMPLAINT:  resp distress    History of Present Illness:     29 yowm active smoker with nl pfts 06/2019 with history of anxiety, ASCVD, CHF,  diabetes mellitus type 2, hyperlipidemia, hypertension, obstructive sleep apnea, CAF and more presented to ED with chief complaint of dyspnea.  Patient reports he has had fever and dyspnea for 1 week PTA. He reports that he was diagnosed with the flu 6 days PTA.  He was given a steroid and Tessalon Perles per his report.   He had a T-max at home of 102.    Patient does admit to orthopnea but no increase in peripheral edema.  He has a cough but is only occasionally productive with green sputum.  No hemoptysis.  He does have some chest tightness that is been going on for the whole week.  It is worse with exertion, better with rest.  Worse lying  flat as well.  Patient reports feeling lightheaded when he has the chest tightness.    He does not wear oxygen at home.    He does sleep with a CPAP at home.  Patient does report decreased p.o. intake.  Yesterday all he had all day was chicken soup.  Last bowel movement was yesterday.      The ED Temp 99.7, heart rate 101-125, respiratory rate 16-25, blood pressure 125/88, satting 96% on 3 L nasal cannula  Was improving but rapidly worse sob / desats 12/12 requiring bipap and then intubation by anesthesia         Pertinent  Medical History  CAF - no recent echo, lat one 03/2019  Mod LAE with elevated LVEDP HBP DM OSA on CPAP    Significant Hospital Events: Including procedures, antibiotic start and stop dates in addition to other pertinent events   Resp viral panel 12/9 >  POS FLU Oral ET  12/12  L IJ  CVL 12/12 Placed on Amiodarone drip 12/12 MRSA screen 12/12   POS BC x 2 >>> Trach sample  12/13 mixed orgs, ambundant wbc>>> 12/13 Echo Mod lvh, mild  LAE 12/14 added mucomyst for LLL Atx   Scheduled Meds:  acetylcysteine  2 mL Nebulization QID   ARIPiprazole  2 mg Per Tube Daily   atorvastatin  80 mg Per Tube Daily   chlorhexidine gluconate (MEDLINE KIT)  15 mL Mouth Rinse BID   Chlorhexidine Gluconate Cloth  6 each Topical Daily   docusate  100 mg Per Tube BID   enoxaparin (LOVENOX) injection  95 mg Subcutaneous Q12H   [START ON 08/12/2021] escitalopram  20 mg Per Tube Daily   insulin aspart  0-15 Units Subcutaneous Q4H   levalbuterol  1.25 mg Nebulization QID   mouth rinse  15 mL Mouth Rinse 10 times per day   methylPREDNISolone (SOLU-MEDROL) injection  80 mg Intravenous Q12H   mupirocin ointment   Nasal BID   pantoprazole (PROTONIX) IV  40 mg Intravenous Q24H   sodium chloride flush  10-40 mL Intracatheter Q12H   Continuous Infusions:  amiodarone 30 mg/hr (08/11/21 0700)   ceFEPime (MAXIPIME) IV 2 g (08/11/21 0956)   fentaNYL infusion INTRAVENOUS 100 mcg/hr (08/11/21 0700)   midazolam 2 mg/hr (08/11/21 0700)   phenylephrine (NEO-SYNEPHRINE) Adult infusion Stopped (08/11/21 0538)   PRN Meds:.acetaminophen **OR** acetaminophen, bisacodyl, ondansetron **OR** ondansetron (ZOFRAN) IV, sodium chloride flush  Interim History / Subjective:  Versed/ fentanyl drips, f/c / synchronous with vent   Objective   Blood pressure 109/73, pulse 97, temperature 97.8 F (36.6 C), temperature source Axillary, resp. rate 17, height $RemoveBe'5\' 8"'IFDUCEjha$  (1.727 m), weight 91.7 kg, SpO2 94 %. CVP:  [5 mmHg-11 mmHg] 10 mmHg  Vent Mode: PRVC FiO2 (%):  [75 %] 75 % Set Rate:  [18 bmp] 18 bmp Vt Set:  [540 mL] 540 mL PEEP:  [5 cmH20] 5 cmH20 Plateau Pressure:  [13 cmH20-15 cmH20] 15 cmH20   Intake/Output Summary (Last 24 hours) at 08/11/2021 1248 Last data filed at 08/11/2021 0700 Gross per 24 hour  Intake 964.61 ml  Output 1270 ml  Net -305.39 ml   Filed Weights   08/09/21 1456 08/10/21 0315 08/11/21 0400  Weight: 94 kg 92 kg 91.7 kg   CVP:  [5  mmHg-11 mmHg] 10 mmHg   Examination: Tmax   99 General appearance:    appears alert/ f/c   No jvd Oropharynx og/et Neck supple Lungs with a few scattered exp > insp rhonchi bilaterally and decreased L base  IRIR at 100 / no s3 or or sign murmur Abd obese with limited  excursion  Extr warm with no edema or clubbing noted Neuro  Sensorium appears alert/ fc,  no apparent motor deficits;          I personally reviewed images and agree with radiology impression as follows:  CXR:   12/13  23:35 1. Increasingly dense left lower lobe consolidation and small left pleural effusion. 2. Unchanged small infiltrate in the right apex, with hazy right basilar atelectasis or pneumonitis. 3. Cardiomegaly without findings of CHF.    Assessment & Plan:  1) Acute resp distress in pt with Influenza A infection/? Pna and component of acute on chronic chf with diastolic dysfunction likely esp in RAF  >>> try decrease Vt  7 cc /kg/ increaese PEEP per ARDS protocol   >>> keep on dry side if bun/ creat/uop allow  2) Prob underlying asthma in this smoker but no evidence of COPD >>> continue xopenex/ added mucomyst noon 12/14   3 ) CAF better controlled but on Amiodarone drip since 12/12 >>> rx per cards  4)  hypotensive resolved, off neo am 12/14  >>> continue to keep on dry side f bp/bun/ creat allow but be aware that increasing cpap will artificially raise CVP  and alb is very low       Best Practice (right click and "Reselect all SmartList Selections" daily)   Diet/type: NPO DVT prophylaxis: LMWH GI prophylaxis: PPI Lines: N/A Foley:  Yes, and it is still needed Code Status:  full code Last date of multidisciplinary goals of care discussion  per [facility]  - updated fm at bedside 12/14 may need tx to GS0 if not making progress toward w/e   Labs   CBC: Recent Labs  Lab 08/08/21 0423 08/09/21 0429 08/09/21 1623 08/10/21 0350 08/11/21 0356  WBC 14.1* 13.4* 16.1* 15.2* 17.4*   NEUTROABS  --   --  13.5*  --   --   HGB 14.7 13.9 14.3 12.6* 12.1*  HCT 45.2 42.3 43.3 37.9* 37.0*  MCV 86.9 86.5 86.6 86.1 87.5  PLT 187 190 195 197 914    Basic Metabolic Panel: Recent Labs  Lab 08/07/21 0556 08/08/21 0423 08/09/21 0429 08/09/21 1623 08/10/21 0350 08/11/21 0356  NA 134* 136 131* 131* 131* 134*  K 5.0 4.8 4.6 5.0 3.9 4.2  CL 102  100 100 98 99 100  CO2 26 27 24 24 24 25   GLUCOSE 194* 152* 149* 223* 239* 208*  BUN 24* 28* 28* 28* 29* 40*  CREATININE 1.08 0.93 0.96 1.06 0.92 0.97  CALCIUM 8.6* 9.0 8.3* 8.1* 7.9* 7.9*  MG 2.3  --   --   --  2.4 2.4  PHOS  --   --   --   --  3.1 3.8   GFR: Estimated Creatinine Clearance: 83.4 mL/min (by C-G formula based on SCr of 0.97 mg/dL). Recent Labs  Lab 08/09/21 0429 08/09/21 1623 08/09/21 1833 08/10/21 0350 08/10/21 0753 08/11/21 0356  PROCALCITON  --  <0.10  --   --   --   --   WBC 13.4* 16.1*  --  15.2*  --  17.4*  LATICACIDVEN  --  2.9* 2.4*  --  1.4  --     Liver Function Tests: Recent Labs  Lab 08/06/21 1908 08/07/21 0556 08/09/21 1623 08/11/21 0356  AST 36 24 28 15   ALT 53* 50* 47* 31  ALKPHOS 96 96 101 75  BILITOT 0.7 1.0 2.1* 0.7  PROT 6.8 6.9 7.0 5.7*  ALBUMIN 3.5 3.4* 3.3* 2.5*   No results for input(s): LIPASE, AMYLASE in the last 168 hours. No results for input(s): AMMONIA in the last 168 hours.  ABG    Component Value Date/Time   PHART 7.372 08/11/2021 0815   PCO2ART 47.3 08/11/2021 0815   PO2ART 76.8 (L) 08/11/2021 0815   HCO3 25.7 08/11/2021 0815   TCO2 25 10/18/2016 1729   ACIDBASEDEF 1.4 08/09/2021 1738   O2SAT 93.4 08/11/2021 0815     Coagulation Profile: Recent Labs  Lab 08/06/21 1908 08/09/21 1722  INR 1.0 1.5*    Cardiac Enzymes: No results for input(s): CKTOTAL, CKMB, CKMBINDEX, TROPONINI in the last 168 hours.  HbA1C: Hgb A1c MFr Bld  Date/Time Value Ref Range Status  08/07/2021 05:56 AM 6.9 (H) 4.8 - 5.6 % Final    Comment:    (NOTE) Pre diabetes:           5.7%-6.4%  Diabetes:              >6.4%  Glycemic control for   <7.0% adults with diabetes   02/10/2021 08:53 AM 6.8 (H) 4.6 - 6.5 % Final    Comment:    Glycemic Control Guidelines for People with Diabetes:Non Diabetic:  <6%Goal of Therapy: <7%Additional Action Suggested:  >8%     CBG: Recent Labs  Lab 08/10/21 1950 08/10/21 2350 08/11/21 0407 08/11/21 0731 08/11/21 1127  GLUCAP 193* 202* 201* 181* 181*       The patient is critically ill with multiple organ systems failure and requires high complexity decision making for assessment and support, frequent evaluation and titration of therapies, application of advanced monitoring technologies and extensive interpretation of multiple databases. Critical Care Time devoted to patient care services described in this note is 40 min  minutes.   Christinia Gully, MD [facility] [phone]   After 7:00 pm call Elink  [phone]

## 2021-08-11 NOTE — Progress Notes (Addendum)
PROGRESS NOTE    Patient: Shaun [name]                            PCP: Maximiano Coss, NP                    DOB: 18-Jun-1956            DOA: 08/06/2021 EHU:[id number]             DOS: 08/11/2021, 5:23 PM   LOS: 5 days   Date of Service: The patient was seen and examined on 08/11/2021  Subjective:   The patient was seen and examined this morning, still intubated on the vent, on amiodarone drip... Tachycardic,   Note: 08/11/21--remains intubated and sedated, vent settings adjusted, discussed with pulmonologist, wife at bedside, -Weaned off pressors -Remains on IV amiodarone   Brief Narrative:   Shaun [name]  is a 65 y.o. male, with history of anxiety, ASCVD, CHF, COPD, diabetes mellitus type 2, hyperlipidemia, hypertension, obstructive sleep apnea, paroxysmal atrial fibrillation, and more presents ED with chief complaint of dyspnea.  Patient reports he has had fever and dyspnea for 1 week.  He reports that he was diagnosed with the flu 6 days ago.  He was given a steroid and Tessalon Perles per his report.  He reports that it was not helpful.  He had a T-max at home of 102.  He tried Tylenol and thinks that he had temporary relief.  He has a cough but is only occasionally productive with green sputum.   He does not wear oxygen at home.  He does sleep with a CPAP at home.  Pa   Patient smokes half a pack a day but declines a nicotine patch as he has not had a cigarette since he has been ill.   Patient is vaccinated for COVID but not flu.  Patient is full code.  ED Temp 99.7, heart rate 101-125, respiratory rate 16-25, blood pressure 125/88, satting 96% on 3 L nasal cannula No leukocytosis with white blood cell count 10.3, hemoglobin 15 Chemistry panel reveals a decreased bicarb at 21 and an AKI with a creatinine of 1.43 Patient is fluid positive Chest x-ray shows no active disease    Assessment & Plan:   Principal Problem:   Acute respiratory failure with hypoxemia  (HCC) Active Problems:   Chronic atrial fibrillation (HCC)   Tobacco abuse   Obstructive sleep apnea   Essential hypertension   Non-insulin treated type 2 diabetes mellitus (HCC)   COPD (chronic obstructive pulmonary disease) (HCC)   Chronic diastolic CHF (congestive heart failure) (HCC)   Influenza   Endotracheally intubated  Principal Problem:    Acute respiratory failure with hypoxia (HCC)-due to influenza A  -08/09/2021-worsening respiratory failure, hypoxia  - 12/12 -status post intubation -on the vent -12/12 -left IJ placed  08/11/21--remains intubated and sedated, vent settings adjusted, discussed with pulmonologist, wife at bedside, -Weaned off pressors -Remains on IV amiodarone  On vent PRVC  75%/24/10/480 ABG: 7.4/39.4/120/12.5 -Subsequent ABGs chest x-rays reviewed -Continue high-dose IV steroid Solu-Medrol 80 mg twice daily - DuoNeb bronchodilators, mucolytic, -Continue Tamiflu PCCM Dr. Melvyn Novas consulted appreciate his close follow-up   Sepsis due to bacterial pneumonia superimposed on Influenza -Met Sepsis criteria-due to respiratory failure, fever, leukocytosis, tachycardic, tachypneic with lactic acidosis - -Status post IV fluid resuscitation, continue antiviral Tamiflu -Empiric IV antibiotics have been initiated 08/09/2021--may be de-escalated as patient status improves -Blood cultures  obtained 08/09/2021 -Urinalysis negative for any leukocyte Estrace WBC 0-5   A. fib with RVR -Cardiologist Dr. Domenic Polite consulted, following closely, Per recommendation patient remains on amiodarone drip -Lovenox therapeutic dose initiated  Acute renal insufficiency -Baseline creatinine around 0.89, Cr. On admission>> 1.43 >>> 1.08 >> 0.93, 0.92 -Holding ACE inhibitor's and Lasix -We will try to avoid nephrotoxins -Monitor kidney function closely   Active Problems:  Chronic atrial fibrillation (HCC) -home medication Lopressor and Eliquis  Tobacco abuse - would  benefit from smoking cessation  Obstructive sleep apnea -currently intubated on the vent    Essential hypertension -holding home BP meds    Non-insulin treated type 2 diabetes mellitus (HCC) -Use Novolog/Humalog Sliding scale insulin with Accu-Cheks/Fingersticks as ordered     COPD (chronic obstructive pulmonary disease) (HCC) -management as above   Chronic diastolic CHF (congestive heart failure) (HCC) -monitoring daily weights, I's and O's, holding Lasix   CRITICAL CARE Performed by: Roxan Hockey   Total critical care time: 43 minutes  Critical care time was exclusive of separately billable procedures and treating other patients.  Critical care was necessary to treat or prevent imminent or life-threatening deterioration. -  08/11/21--remains intubated and sedated, vent settings adjusted, discussed with pulmonologist, wife at bedside, -Weaned off pressors -Remains on IV amiodarone  Critical care was time spent personally by me on the following activities: development of treatment plan with patient and/or surrogate as well as nursing, discussions with consultants, evaluation of patient's response to treatment, examination of patient, obtaining history from patient or surrogate, ordering and performing treatments and interventions, ordering and review of laboratory studies, ordering and review of radiographic studies, pulse oximetry and re-evaluation of patient's condition.   ------------------------------------------------------------------------------------------------------------------------------------------ Cultures; Blood Cultures x 2 >> NGTD Sputum cultures >>   Antimicrobials: Tamiflu Cefepime 08/09/2021  Consultants: PCCM--Dr Melvyn Novas  -------------------------------------------------------------------------------------------------------------------------------------  DVT prophylaxis:  SCD/Compression stockings and NL:ZJQBHAL Code Status:   Code Status: Full  Code  Family Communication: Discussed with   wife at bedside   Admission status:   Status is: Inpatient  Remains inpatient appropriate because: In acute respiratory failure, meeting sepsis criteria, intubated, on vent, hypotensive, A. fib with RVR... Requiring vent management, blood pressure management, heart rate management, treating underlying infection..   Level of care: ICU   Procedures:   No admission procedures for hospital encounter.    Antimicrobials:  Anti-infectives (From admission, onward)    Start     Dose/Rate Route Frequency Ordered Stop   08/10/21 0015  oseltamivir (TAMIFLU) capsule 75 mg        75 mg Per Tube 2 times daily 08/09/21 2316 08/11/21 0944   08/09/21 1745  ceFEPIme (MAXIPIME) 2 g in sodium chloride 0.9 % 100 mL IVPB  Status:  Discontinued        2 g 200 mL/hr over 30 Minutes Intravenous  Once 08/09/21 1657 08/09/21 1702   08/09/21 1730  ceFEPIme (MAXIPIME) 2 g in sodium chloride 0.9 % 100 mL IVPB        2 g 200 mL/hr over 30 Minutes Intravenous Every 8 hours 08/09/21 1702     08/07/21 0800  oseltamivir (TAMIFLU) capsule 75 mg  Status:  Discontinued        75 mg Oral 2 times daily 08/07/21 0711 08/09/21 2316        Medication:   acetylcysteine  2 mL Nebulization QID   ARIPiprazole  2 mg Per Tube Daily   atorvastatin  80 mg Per Tube Daily   chlorhexidine gluconate (MEDLINE  KIT)  15 mL Mouth Rinse BID   Chlorhexidine Gluconate Cloth  6 each Topical Daily   docusate  100 mg Per Tube BID   enoxaparin (LOVENOX) injection  95 mg Subcutaneous Q12H   [START ON 08/12/2021] escitalopram  20 mg Per Tube Daily   feeding supplement (PROSource TF)  45 mL Per Tube BID   insulin aspart  0-15 Units Subcutaneous Q4H   levalbuterol  1.25 mg Nebulization QID   mouth rinse  15 mL Mouth Rinse 10 times per day   methylPREDNISolone (SOLU-MEDROL) injection  40 mg Intravenous Q12H   mupirocin ointment   Nasal BID   pantoprazole (PROTONIX) IV  40 mg Intravenous  Q24H   sodium chloride flush  10-40 mL Intracatheter Q12H    acetaminophen **OR** acetaminophen, bisacodyl, ondansetron **OR** ondansetron (ZOFRAN) IV, sodium chloride flush   Objective:   Vitals:   08/11/21 1500 08/11/21 1558 08/11/21 1602 08/11/21 1716  BP: 105/65     Pulse: 87     Resp: (!) 26     Temp:    97.6 F (36.4 C)  TempSrc:    Axillary  SpO2: 94% 95% 96%   Weight:      Height:        Intake/Output Summary (Last 24 hours) at 08/11/2021 1723 Last data filed at 08/11/2021 1504 Gross per 24 hour  Intake 1658.65 ml  Output 1270 ml  Net 388.65 ml   Filed Weights   08/09/21 1456 08/10/21 0315 08/11/21 0400  Weight: 94 kg 92 kg 91.7 kg     Examination:    Physical Exam:   General:  Sedated-intubated  HEENT:  ET tube in good position on the vent  Neuro:  Sedated on IV  propofol gtt   Lungs:   Positive air sounds throughout the lung fields, mild rhonchi diffusely no wheezing or crackles  Cardio:    Tachycardic A. fib on amiodarone drip, negative for  rubs or clicks  Abdomen:   Soft, hypoactive bowel sounds,  peritoneal signs.  Muscular skeletal:  Definite peripheral edema, positive pulses, sedated  Skin:  Dry, warm to touch, negative for any Rashes,  Wounds: Please see nursing documentation    Lt IJ central Line Foley        -----------------------------------------------------------------------------------------------------------------------------    LABs:  CBC Latest Ref Rng & Units 08/11/2021 08/10/2021 08/09/2021  WBC 4.0 - 10.5 K/uL 17.4(H) 15.2(H) 16.1(H)  Hemoglobin 13.0 - 17.0 g/dL 12.1(L) 12.6(L) 14.3  Hematocrit 39.0 - 52.0 % 37.0(L) 37.9(L) 43.3  Platelets 150 - 400 K/uL 182 197 195   CMP Latest Ref Rng & Units 08/11/2021 08/10/2021 08/09/2021  Glucose 70 - 99 mg/dL 208(H) 239(H) 223(H)  BUN 8 - 23 mg/dL 40(H) 29(H) 28(H)  Creatinine 0.61 - 1.24 mg/dL 0.97 0.92 1.06  Sodium 135 - 145 mmol/L 134(L) 131(L) 131(L)  Potassium 3.5 - 5.1  mmol/L 4.2 3.9 5.0  Chloride 98 - 111 mmol/L 100 99 98  CO2 22 - 32 mmol/L 25 24 24   Calcium 8.9 - 10.3 mg/dL 7.9(L) 7.9(L) 8.1(L)  Total Protein 6.5 - 8.1 g/dL 5.7(L) - 7.0  Total Bilirubin 0.3 - 1.2 mg/dL 0.7 - 2.1(H)  Alkaline Phos 38 - 126 U/L 75 - 101  AST 15 - 41 U/L 15 - 28  ALT 0 - 44 U/L 31 - 47(H)       Micro Results Recent Results (from the past 240 hour(s))  Resp Panel by RT-PCR (Flu A&B, Covid) Nasopharyngeal Swab  Status: Abnormal   Collection Time: 08/06/21  7:28 PM   Specimen: Nasopharyngeal Swab; Nasopharyngeal(NP) swabs in vial transport medium  Result Value Ref Range Status   SARS Coronavirus 2 by RT PCR NEGATIVE NEGATIVE Final    Comment: (NOTE) SARS-CoV-2 target nucleic acids are NOT DETECTED.  The SARS-CoV-2 RNA is generally detectable in upper respiratory specimens during the acute phase of infection. The lowest concentration of SARS-CoV-2 viral copies this assay can detect is 138 copies/mL. A negative result does not preclude SARS-Cov-2 infection and should not be used as the sole basis for treatment or other patient management decisions. A negative result may occur with  improper specimen collection/handling, submission of specimen other than nasopharyngeal swab, presence of viral mutation(s) within the areas targeted by this assay, and inadequate number of viral copies(<138 copies/mL). A negative result must be combined with clinical observations, patient history, and epidemiological information. The expected result is Negative.  Fact Sheet for Patients:  EntrepreneurPulse.com.au  Fact Sheet for Healthcare Providers:  IncredibleEmployment.be  This test is no t yet approved or cleared by the [place] FDA and  has been authorized for detection and/or diagnosis of SARS-CoV-2 by FDA under an Emergency Use Authorization (EUA). This EUA will remain  in effect (meaning this test can be used) for the duration  of the COVID-19 declaration under Section 564(b)(1) of the Act, 21 U.S.C.section 360bbb-3(b)(1), unless the authorization is terminated  or revoked sooner.       Influenza A by PCR POSITIVE (A) NEGATIVE Final   Influenza B by PCR NEGATIVE NEGATIVE Final    Comment: (NOTE) The Xpert Xpress SARS-CoV-2/FLU/RSV plus assay is intended as an aid in the diagnosis of influenza from Nasopharyngeal swab specimens and should not be used as a sole basis for treatment. Nasal washings and aspirates are unacceptable for Xpert Xpress SARS-CoV-2/FLU/RSV testing.  Fact Sheet for Patients: EntrepreneurPulse.com.au  Fact Sheet for Healthcare Providers: IncredibleEmployment.be  This test is not yet approved or cleared by the [place] FDA and has been authorized for detection and/or diagnosis of SARS-CoV-2 by FDA under an Emergency Use Authorization (EUA). This EUA will remain in effect (meaning this test can be used) for the duration of the COVID-19 declaration under Section 564(b)(1) of the Act, 21 U.S.C. section 360bbb-3(b)(1), unless the authorization is terminated or revoked.  Performed at [facility], [address], [place], [place] [address]   MRSA Next Gen by PCR, Nasal     Status: Abnormal   Collection Time: 08/09/21  3:03 PM   Specimen: Nasal Mucosa; Nasal Swab  Result Value Ref Range Status   MRSA by PCR Next Gen DETECTED (A) NOT DETECTED Final    Comment: RESULT CALLED TO, READ BACK BY AND VERIFIED WITH: KARA, RN @ [phone] ON 08/10/21 C VARNER (NOTE) The GeneXpert MRSA Assay (FDA approved for NASAL specimens only), is one component of a comprehensive MRSA colonization surveillance program. It is not intended to diagnose MRSA infection nor to guide or monitor treatment for MRSA infections. Test performance is not FDA approved in patients less than 69 years old. Performed at [facility], [address], [place], [place] [address]   Culture,  blood (routine x 2)     Status: None (Preliminary result)   Collection Time: 08/09/21  5:22 PM   Specimen: BLOOD  Result Value Ref Range Status   Specimen Description BLOOD  Final   Special Requests NONE  Final   Culture   Final    NO GROWTH <  12 HOURS Performed at [facility], [address] [place], [place] [address]    Report Status PENDING  Incomplete  Culture, blood (routine x 2)     Status: None (Preliminary result)   Collection Time: 08/09/21  5:22 PM   Specimen: BLOOD  Result Value Ref Range Status   Specimen Description BLOOD  Final   Special Requests NONE  Final   Culture   Final    NO GROWTH < 12 HOURS Performed at [facility], [address] [place], [place] [address]    Report Status PENDING  Incomplete  Expectorated Sputum Assessment w Gram Stain, Rflx to Resp Cult     Status: None   Collection Time: 08/10/21 11:10 AM   Specimen: Expectorated Sputum  Result Value Ref Range Status   Specimen Description EXPECTORATED SPUTUM  Final   Special Requests NONE  Final   Sputum evaluation   Final    THIS SPECIMEN IS ACCEPTABLE FOR SPUTUM CULTURE Performed at [facility], [address], [place], [place] [address]    Report Status 08/10/2021 FINAL  Final  Culture, Respiratory w Gram Stain     Status: None (Preliminary result)   Collection Time: 08/10/21 11:10 AM  Result Value Ref Range Status   Specimen Description   Final    EXPECTORATED SPUTUM Performed at [facility], [address] [place], [place] [address]    Special Requests   Final    NONE Reflexed from [id number] Performed at [facility], [address] [place], [place] [address]    Gram Stain   Final    ABUNDANT WBC PRESENT,BOTH PMN AND MONONUCLEAR RARE GRAM POSITIVE COCCI RARE GRAM NEGATIVE RODS    Culture   Final    CULTURE REINCUBATED FOR BETTER GROWTH Performed at [facility], [place] [address], [place], [place] [address]    Report Status PENDING  Incomplete    Radiology Reports DG CHEST  PORT 1 VIEW  Result Date: 08/11/2021 CLINICAL DATA:  Hypoxia and respiratory failure. EXAM: PORTABLE CHEST 1 VIEW COMPARISON:  Portable chest earlier today at 6:25 a.m. FINDINGS: 08/10/2021 at 11:44 p.m. the heart is enlarged. Central vessels are normal caliber. Emphysematous and chronic interstitial changes are present. Again noted is a small infiltrate in the right upper lobe apex, and hazy atelectasis or pneumonitis in the right base. On the current exam there is increasingly dense left lower lobe consolidation, and small increased left pleural effusion. No other new opacity is seen. ETT tip is 5 cm from the carina, NGT tip in the body of stomach based on the positioning of the proximal side hole with the tip not included, left IJ central line tip in the SVC. IMPRESSION: 1. Increasingly dense left lower lobe consolidation and small left pleural effusion. 2. Unchanged small infiltrate in the right apex, with hazy right basilar atelectasis or pneumonitis. 3. Cardiomegaly without findings of CHF. Electronically Signed   By: Telford Nab M.D.   On: 08/11/2021 00:17   DG CHEST PORT 1 VIEW  Result Date: 08/10/2021 CLINICAL DATA:  Difficulty breathing EXAM: PORTABLE CHEST 1 VIEW COMPARISON:  Previous studies including the examination is done on 08/09/2021 FINDINGS: Transverse diameter of heart is increased. Central pulmonary vessels are slightly more prominent. Interstitial markings in the left parahilar region and lower lung fields appear more prominent. There is no focal consolidation. There is no significant pleural effusion or pneumothorax. Tip of endotracheal is 5.1 cm above the carina. Tip of central venous catheter is seen  in the superior vena cava. Enteric tube is noted traversing the esophagus. Distal portion of the enteric tube is seen in the stomach. Tip is not included in the radiograph. IMPRESSION: Cardiomegaly. Interstitial markings in the parahilar regions and lower lung fields appear more  prominent. This may suggest mild interstitial edema or interstitial pneumonitis or an artifact due to differences in the techniques. Electronically Signed   By: Elmer Picker M.D.   On: 08/10/2021 09:52   DG CHEST PORT 1 VIEW  Result Date: 08/09/2021 CLINICAL DATA:  Shortness of breath EXAM: PORTABLE CHEST 1 VIEW COMPARISON:  08/07/2021.  09/19/2018. FINDINGS: Chronic cardiomegaly and aortic atherosclerosis. Worsened venous hypertension, possibly with mild interstitial edema. No visible effusion. There could also be a bronchial thickening pattern consistent with bronchitis. IMPRESSION: Probable fluid overload/low level congestive heart failure. Cardiomegaly, venous hypertension and mild interstitial edema. Question also bronchial thickening that could go along with bronchitis. Electronically Signed   By: Nelson Chimes M.D.   On: 08/09/2021 13:17   Portable chest 1 View  Result Date: 08/07/2021 CLINICAL DATA:  65 year old male with respiratory failure.  Smoker. EXAM: PORTABLE CHEST 1 VIEW COMPARISON:  Portable chest 08/06/2021 and earlier. FINDINGS: Portable AP upright view at 0439 hours. Stable lung volumes and mediastinal contours. Borderline cardiomegaly. Stable lung markings. Allowing for portable technique the lungs are clear. No pneumothorax or pleural effusion. Visualized tracheal air column is within normal limits. No acute osseous abnormality identified. IMPRESSION: No acute cardiopulmonary abnormality. Electronically Signed   By: Genevie Ann M.D.   On: 08/07/2021 04:59   DG Chest Port 1 View  Result Date: 08/06/2021 CLINICAL DATA:  Shortness of breath EXAM: PORTABLE CHEST 1 VIEW COMPARISON:  09/21/2018 FINDINGS: Cardiomegaly. No confluent opacities, effusions or edema. No acute bony abnormality. IMPRESSION: Cardiomegaly.  No active disease. Electronically Signed   By: Rolm Baptise M.D.   On: 08/06/2021 19:45   DG Chest Port 1V same Day  Result Date: 08/09/2021 CLINICAL DATA:  Central  line placement. EXAM: PORTABLE CHEST 1 VIEW COMPARISON:  Chest radiograph dated 08/09/2021. FINDINGS: Endotracheal tube with tip approximately 4.5 cm above the carina. Enteric tube extends below the diaphragm with tip in the left upper abdomen, likely in the proximal stomach. Left IJ central venous line with tip over central SVC. Mild cardiomegaly with mild vascular congestion. Left lung base density may represent atelectasis. Developing infiltrate is not excluded. No large pleural effusion. No pneumothorax. No acute osseous pathology. Degenerative changes of spine. IMPRESSION: 1. Endotracheal tube above the carina. 2. Left IJ central venous line with tip over central SVC. No pneumothorax. Mild cardiomegaly and vascular congestion. Electronically Signed   By: Anner Crete M.D.   On: 08/09/2021 19:59   DG Chest Port 1V same Day  Result Date: 08/09/2021 CLINICAL DATA:  65 year old male with intubation EXAM: PORTABLE CHEST 1 VIEW COMPARISON:  08/09/2021, 08/07/2021 FINDINGS: Cardiomediastinal silhouette unchanged in size and contour. No evidence of central vascular congestion. No interlobular septal thickening. Interval placement of endotracheal tube, terminating suitably above the carina, 3.9 cm. Interval placement of gastric tube projecting over the mediastinum and terminating within the left upper quadrant Low lung volumes with no confluent airspace disease. Coarsened interstitial markings similar to the recent comparison chest x-rays. No pneumothorax or pleural effusion. No acute displaced fracture. Similar appearance lytic changes at the distal right clavicle IMPRESSION: Interval placement of endotracheal tube, terminating suitably above the carina. Gastric tube terminates in the stomach. Low lung volumes with likely atelectasis at the  bases Electronically Signed   By: Corrie Mckusick D.O.   On: 08/09/2021 16:28   ECHOCARDIOGRAM COMPLETE  Result Date: 08/10/2021    ECHOCARDIOGRAM REPORT   Patient Name:    Shaun [name] Date of Exam: 08/10/2021 Medical Rec #:  [id number]          Height:       68.0 in Accession #:    [id number]         Weight:       202.8 lb Date of Birth:  12-16-1955         BSA:          2.056 m Patient Age:    71 years           BP:           106/68 mmHg Patient Gender: M                  HR:           97 bpm. Exam Location:  [facility] Procedure: 2D Echo, Cardiac Doppler and Color Doppler Indications:    Atrial Fibrillation I48.91  History:        Patient has prior history of Echocardiogram examinations, most                 recent 04/08/2019. CHF, CAD; Risk Factors:Hypertension and                 Dyslipidemia. Sleep Apnea.  Sonographer:    Alvino Chapel RCS Referring Phys: Raemon Comments: Patient on mechanical ventilator at time of echo exam. IMPRESSIONS  1. Left ventricular ejection fraction, by estimation, is 60 to 65%. The left ventricle has normal function. The left ventricle has no regional wall motion abnormalities. There is moderate concentric left ventricular hypertrophy. Left ventricular diastolic parameters are indeterminate.  2. Right ventricular systolic function is normal. The right ventricular size is normal. Tricuspid regurgitation signal is inadequate for assessing PA pressure.  3. Left atrial size was mildly dilated.  4. The mitral valve is grossly normal. Trivial mitral valve regurgitation.  5. The aortic valve is tricuspid. Aortic valve regurgitation is not visualized.  6. Unable to estimate CVP. Comparison(s): Prior images reviewed side by side. LVEF stable at 60-65%. FINDINGS  Left Ventricle: Left ventricular ejection fraction, by estimation, is 60 to 65%. The left ventricle has normal function. The left ventricle has no regional wall motion abnormalities. The left ventricular internal cavity size was normal in size. There is  moderate concentric left ventricular hypertrophy. Left ventricular diastolic function could not be evaluated due  to atrial fibrillation. Left ventricular diastolic parameters are indeterminate. Right Ventricle: The right ventricular size is normal. No increase in right ventricular wall thickness. Right ventricular systolic function is normal. Tricuspid regurgitation signal is inadequate for assessing PA pressure. Left Atrium: Left atrial size was mildly dilated. Right Atrium: Right atrial size was normal in size. Pericardium: There is no evidence of pericardial effusion. Presence of epicardial fat layer. Mitral Valve: The mitral valve is grossly normal. Trivial mitral valve regurgitation. Tricuspid Valve: The tricuspid valve is grossly normal. Tricuspid valve regurgitation is trivial. Aortic Valve: The aortic valve is tricuspid. There is mild to moderate aortic valve annular calcification. Aortic valve regurgitation is not visualized. Pulmonic Valve: The pulmonic valve was not well visualized. Pulmonic valve regurgitation is trivial. Aorta: The aortic root is normal in size and structure. Venous: Unable to estimate CVP. IVC assessment for  right atrial pressure unable to be performed due to mechanical ventilation. IAS/Shunts: No atrial level shunt detected by color flow Doppler.  LEFT VENTRICLE PLAX 2D LVIDd:         4.60 cm LVIDs:         2.90 cm LV PW:         1.30 cm LV IVS:        1.40 cm LVOT diam:     1.90 cm LV SV:         46 LV SV Index:   22 LVOT Area:     2.84 cm  RIGHT VENTRICLE RV S prime:     10.70 cm/s TAPSE (M-mode): 1.8 cm LEFT ATRIUM             Index        RIGHT ATRIUM           Index LA diam:        4.10 cm 1.99 cm/m   RA Area:     18.50 cm LA Vol (A2C):   84.4 ml 41.05 ml/m  RA Volume:   52.00 ml  25.29 ml/m LA Vol (A4C):   75.7 ml 36.82 ml/m LA Biplane Vol: 79.8 ml 38.81 ml/m  AORTIC VALVE LVOT Vmax:   91.70 cm/s LVOT Vmean:  58.300 cm/s LVOT VTI:    0.161 m  AORTA Ao Root diam: 3.30 cm MITRAL VALVE MV Area (PHT): 4.15 cm    SHUNTS MV Decel Time: 183 msec    Systemic VTI:  0.16 m MV E velocity:  86.40 cm/s  Systemic Diam: 1.90 cm Rozann Lesches MD Electronically signed by Rozann Lesches MD Signature Date/Time: 08/10/2021/8:24:44 PM    Final     SIGNED: Roxan Hockey, MD,   [facility],  Pager (please use [email or web address] to page/text) Please use Epic Secure Chat for non-urgent communication (7AM-7PM)  If 7PM-7AM, please contact night-coverage [email or web address], 08/11/2021, 5:23 PM

## 2021-08-11 NOTE — Progress Notes (Signed)
Inpatient Diabetes Program Recommendations  AACE/ADA: New Consensus Statement on Inpatient Glycemic Control   Target Ranges:  Prepandial:   less than 140 mg/dL      Peak postprandial:   less than 180 mg/dL (1-2 hours)      Critically ill patients:  140 - 180 mg/dL    Latest Reference Range & Units 08/11/21 04:07 08/11/21 07:31  Glucose-Capillary 70 - 99 mg/dL 201 (H) 181 (H)    Latest Reference Range & Units 08/10/21 07:29 08/10/21 11:13 08/10/21 16:29 08/10/21 19:50 08/10/21 23:50  Glucose-Capillary 70 - 99 mg/dL 211 (H) 190 (H) 207 (H) 193 (H) 202 (H)   Review of Glycemic Control  Diabetes history: DM2 Outpatient Diabetes medications: Metformin 500 mg BID Current orders for Inpatient glycemic control: Novolog 0-15 units Q4H; Solumedrol 80 mg Q12H   Inpatient Diabetes Program Recommendations:     Insulin: If steroids are continued as ordered, please consider ordering Semglee 9 units Q24H.    Thanks Barnie Alderman, RN, MSN, CDE Diabetes Coordinator Inpatient Diabetes Program [phone] (Team Pager from 8am to 5pm)

## 2021-08-11 NOTE — Progress Notes (Signed)
[facility] Progress Note Patient Name: Shaun [name] DOB: [birth year] MRN: [id number]   Date of Service  08/11/2021  HPI/Events of Note  Patient without a bowel movement in two days.  eICU Interventions  Dulcolax tablets 5 mg po daily PRN, Colace 100 mg tablets po BID PRN.        Kerry Kass Deem Marmol 08/11/2021, 6:31 AM

## 2021-08-12 ENCOUNTER — Inpatient Hospital Stay (HOSPITAL_COMMUNITY): Payer: 59

## 2021-08-12 ENCOUNTER — Inpatient Hospital Stay (HOSPITAL_COMMUNITY)
Admission: RE | Admit: 2021-08-12 | Discharge: 2021-08-12 | Disposition: A | Discharge status: Home / Self Care | Payer: 59 | Source: Ambulatory Visit

## 2021-08-12 DIAGNOSIS — Z720 Tobacco use: Secondary | ICD-10-CM

## 2021-08-12 DIAGNOSIS — I5032 Chronic diastolic (congestive) heart failure: Secondary | ICD-10-CM

## 2021-08-12 LAB — GLUCOSE, CAPILLARY
Glucose-Capillary: 240 mg/dL — ABNORMAL HIGH (ref 70–99)
Glucose-Capillary: 276 mg/dL — ABNORMAL HIGH (ref 70–99)
Glucose-Capillary: 280 mg/dL — ABNORMAL HIGH (ref 70–99)
Glucose-Capillary: 291 mg/dL — ABNORMAL HIGH (ref 70–99)
Glucose-Capillary: 291 mg/dL — ABNORMAL HIGH (ref 70–99)
Glucose-Capillary: 315 mg/dL — ABNORMAL HIGH (ref 70–99)

## 2021-08-12 LAB — BASIC METABOLIC PANEL
Anion gap: 9 (ref 5–15)
BUN: 62 mg/dL — ABNORMAL HIGH (ref 8–23)
CO2: 25 mmol/L (ref 22–32)
Calcium: 8.1 mg/dL — ABNORMAL LOW (ref 8.9–10.3)
Chloride: 103 mmol/L (ref 98–111)
Creatinine, Ser: 1.32 mg/dL — ABNORMAL HIGH (ref 0.61–1.24)
GFR, Estimated: 60 mL/min — ABNORMAL LOW (ref >60)
Glucose, Bld: 286 mg/dL — ABNORMAL HIGH (ref 70–99)
Potassium: 4.4 mmol/L (ref 3.5–5.1)
Sodium: 137 mmol/L (ref 135–145)

## 2021-08-12 LAB — CBC
HCT: 37.5 % — ABNORMAL LOW (ref 39.0–52.0)
Hemoglobin: 12 g/dL — ABNORMAL LOW (ref 13.0–17.0)
MCH: 28.3 pg (ref 26.0–34.0)
MCHC: 32 g/dL (ref 30.0–36.0)
MCV: 88.4 fL (ref 80.0–100.0)
Platelets: 167 10*3/uL (ref 150–400)
RBC: 4.24 MIL/uL (ref 4.22–5.81)
RDW: 14.1 % (ref 11.5–15.5)
WBC: 21.6 10*3/uL — ABNORMAL HIGH (ref 4.0–10.5)
nRBC: 0 % (ref 0.0–0.2)

## 2021-08-12 LAB — CULTURE, RESPIRATORY W GRAM STAIN: Culture: NORMAL

## 2021-08-12 MED ORDER — FREE WATER
250.0000 mL | Status: DC
Start: 2021-08-12 — End: 2021-08-15
  Administered 2021-08-12 – 2021-08-15 (×19): 250 mL

## 2021-08-12 MED ORDER — FENTANYL 2500MCG IN NS 250ML (10MCG/ML) PREMIX INFUSION
0.0000 ug/h | INTRAVENOUS | Status: DC
  Administered 2021-08-12: 125 ug/h via INTRAVENOUS
  Administered 2021-08-13: 50 ug/h via INTRAVENOUS
  Administered 2021-08-13 (×2): 150 ug/h via INTRAVENOUS
  Administered 2021-08-14 (×2): 100 ug/h via INTRAVENOUS
  Administered 2021-08-15: 22:00:00 125 ug/h via INTRAVENOUS
  Administered 2021-08-16: 22:00:00 75 ug/h via INTRAVENOUS
  Administered 2021-08-18: 06:00:00 25 ug/h via INTRAVENOUS
  Filled 2021-08-12 (×6): qty 250

## 2021-08-12 MED ORDER — VITAL AF 1.2 CAL PO LIQD
1000.0000 mL | ORAL | Status: DC
Start: 2021-08-12 — End: 2021-08-19
  Administered 2021-08-12 – 2021-08-18 (×8): 1000 mL

## 2021-08-12 MED ORDER — ALBUMIN HUMAN 25 % IV SOLN
12.5000 g | Freq: Once | INTRAVENOUS | Status: AC
  Administered 2021-08-12: 12.5 g via INTRAVENOUS
  Filled 2021-08-12: qty 50

## 2021-08-12 MED ORDER — FUROSEMIDE 10 MG/ML IJ SOLN
40.0000 mg | Freq: Once | INTRAMUSCULAR | Status: AC
  Administered 2021-08-12: 40 mg via INTRAVENOUS
  Filled 2021-08-12: qty 4

## 2021-08-12 NOTE — Progress Notes (Addendum)
Nutrition Follow up  INTERVENTION:   -Vital 1.2 @ 50 ml/hr via OGT and increase by 10 ml every 6 hours to goal rate of 65 ml/hr. 45 ml ProSource TF BID per OGT.    Tube feeding regimen provides 1952 kcal, 139 grams of protein, and 1265 ml of H2O.    Last BM 12/11.  NUTRITION DIAGNOSIS:   Inadequate oral intake related to inability to eat as evidenced by NPO status.  -addressed with enteral feeding  GOAL:  Provide needs based on ASPEN/SCCM guidelines  -progressing  MONITOR:  Vent status, TF, I/O's     ASSESSMENT: Patient is a 65 yo with history of CHF, HTN, DM2, anxiety who reports with dyspnea. Patient with hypoxic respiratory failure, influenza A.    Cardiomegaly per MD. Central line placed.   Patient has an NG/OG vented Dual Lumen Oral external length of 62 cm. Low intermittent suction (50 ml- bile, green OP). Lopez valve changed.  Patient intubated on ventilator support 12/12 @ 1558. MAP-78. Discussed pt with nursing.   MV: 10.8 L/min Temp (24hrs), Avg:98.1 F (36.7 C), Min:97.6 F (36.4 C), Max:98.7 F (37.1 C) Fentanyl: 10 ml/hr  Medications: lipitor, lexapro, insulin, solumedrol, protonix.    12/15 Patient remains intubated. Enteral nutrition started 12/14- he is tolerating Vital 1.2@ 50 ml/hr. Talked with nursing. CBG's and labs reviewed. Last BM 12/11. Free water added 250 ml QID.    Intake/Output Summary (Last 24 hours) at 08/12/2021 1221 Last data filed at 08/12/2021 1052 Gross per 24 hour  Intake 1285.94 ml  Output 1425 ml  Net -139.06 ml    Weights fluctuating between 203-210 lb this hospitalization.Current wt 94.3 kg (207.4 lb).    CBG (last 3)  Recent Labs    08/12/21 0358 08/12/21 0749 08/12/21 1205  GLUCAP 280* 240* 291*    Labs: BMP Latest Ref Rng & Units 08/12/2021 08/11/2021 08/10/2021  Glucose 70 - 99 mg/dL 286(H) 208(H) 239(H)  BUN 8 - 23 mg/dL 62(H) 40(H) 29(H)  Creatinine 0.61 - 1.24 mg/dL 1.32(H) 0.97 0.92  BUN/Creat Ratio 10  - 24 - - -  Sodium 135 - 145 mmol/L 137 134(L) 131(L)  Potassium 3.5 - 5.1 mmol/L 4.4 4.2 3.9  Chloride 98 - 111 mmol/L 103 100 99  CO2 22 - 32 mmol/L 25 25 24   Calcium 8.9 - 10.3 mg/dL 8.1(L) 7.9(L) 7.9(L)      NUTRITION - FOCUSED PHYSICAL EXAM: Unable to complete Nutrition-Focused physical exam at this time.     Diet Order:   Diet Order             Diet NPO time specified  Diet effective now                   EDUCATION NEEDS:  Not appropriate for education at this time  Skin:  Skin Assessment: Reviewed RN Assessment  Last BM:  12/11  Height:   Ht Readings from Last 1 Encounters:  08/09/21 5\' 8"  (1.727 m)    Weight:   Wt Readings from Last 1 Encounters:  08/12/21 94.3 kg    Ideal Body Weight:   70 kg   BMI:  Body mass index is 31.61 kg/m.  Estimated Nutritional Needs:   Kcal:  1700-1900  Protein:  135-143  Fluid:  < 2liters daily  Colman Cater MS,RD,CSG,LDN Contact: AMION

## 2021-08-12 NOTE — Progress Notes (Signed)
Inpatient Diabetes Program Recommendations  AACE/ADA: New Consensus Statement on Inpatient Glycemic Control   Target Ranges:  Prepandial:   less than 140 mg/dL      Peak postprandial:   less than 180 mg/dL (1-2 hours)      Critically ill patients:  140 - 180 mg/dL    Latest Reference Range & Units 08/12/21 03:58 08/12/21 07:49  Glucose-Capillary 70 - 99 mg/dL 280 (H)  Novolog 8 units 240 (H)  Novolog 5 units    Latest Reference Range & Units 08/11/21 07:31 08/11/21 11:27 08/11/21 15:54 08/11/21 19:31 08/11/21 23:08  Glucose-Capillary 70 - 99 mg/dL 181 (H)  Novolog 3 units 181 (H)  Novolog 3 units 211 (H)  Novolog 5 units 237 (H)  Novolog 5 units 242 (H)  Novolog 5 units   Review of Glycemic Control  Diabetes history: DM2 Outpatient Diabetes medications: Metformin 500 mg BID Current orders for Inpatient glycemic control: Novolog 0-15 units Q4H; Solumedrol 40 mg Q12H, Vital @ 40 ml/hr   Inpatient Diabetes Program Recommendations:     Insulin: If steroids are continued as ordered, please consider ordering Semglee 9 units Q24H and Novolog 3 units Q4H for tube feeding coverage. If tube feeding is stopped or held then Novolog tube feeding coverage should also be stopped or held. .    Thanks, Barnie Alderman, RN, MSN, CDE Diabetes Coordinator Inpatient Diabetes Program [phone] (Team Pager from 8am to 5pm)

## 2021-08-12 NOTE — Consult Note (Signed)
° °  [facility] Inpatient Consult   08/12/2021  Selig Wampole Morocho [birth year] [id number]   [facility] [[facility]] Patient: Shaun [name]   Primary Care Provider:  Maximiano Coss, NP, [facility], is an Embedded provider with a chronic care management program and team Patient noted on roster and showing for high risk scores       [name]: Patient will be followed for post hospital support needs for transition of care.   For additional questions or referrals please contact:   Natividad Brood, RN BSN [facility] Liaison  [phone] business mobile phone Toll free office [phone]  Fax number: [phone] [email or web address]

## 2021-08-12 NOTE — Progress Notes (Addendum)
Progress Note  Patient Name: Shaun [name] Date of Encounter: 08/12/2021  Primary Cardiologist: Kirk Ruths, MD  Subjective   Continues sedated on the ventilator.  Inpatient Medications    Scheduled Meds:  acetylcysteine  2 mL Nebulization QID   ARIPiprazole  2 mg Per Tube Daily   atorvastatin  80 mg Per Tube Daily   chlorhexidine gluconate (MEDLINE KIT)  15 mL Mouth Rinse BID   Chlorhexidine Gluconate Cloth  6 each Topical Daily   docusate  100 mg Per Tube BID   enoxaparin (LOVENOX) injection  95 mg Subcutaneous Q12H   escitalopram  20 mg Per Tube Daily   feeding supplement (PROSource TF)  45 mL Per Tube BID   free water  250 mL Per Tube Q4H   insulin aspart  0-15 Units Subcutaneous Q4H   levalbuterol  1.25 mg Nebulization QID   mouth rinse  15 mL Mouth Rinse 10 times per day   methylPREDNISolone (SOLU-MEDROL) injection  40 mg Intravenous Q12H   mupirocin ointment   Nasal BID   pantoprazole (PROTONIX) IV  40 mg Intravenous Q24H   sodium chloride flush  10-40 mL Intracatheter Q12H   Continuous Infusions:  amiodarone 30 mg/hr (08/12/21 0656)   ceFEPime (MAXIPIME) IV Stopped (08/12/21 0258)   feeding supplement (VITAL AF 1.2 CAL) 50 mL/hr at 08/11/21 2310   midazolam 2 mg/hr (08/12/21 0656)   PRN Meds: acetaminophen **OR** acetaminophen, bisacodyl, ondansetron **OR** ondansetron (ZOFRAN) IV, sodium chloride flush   Vital Signs    Vitals:   08/12/21 0645 08/12/21 0700 08/12/21 0702 08/12/21 0740  BP: 103/66 102/68    Pulse: 99 98 100   Resp: 19 (!) 22 18   Temp:      TempSrc:      SpO2: 96% 96%  95%  Weight:      Height:        Intake/Output Summary (Last 24 hours) at 08/12/2021 0851 Last data filed at 08/12/2021 0656 Gross per 24 hour  Intake 1032.56 ml  Output 1100 ml  Net -67.44 ml   Filed Weights   08/10/21 0315 08/11/21 0400 08/12/21 0401  Weight: 92 kg 91.7 kg 94.3 kg    Telemetry    Atrial fibrillation.  Personally reviewed.  ECG     An ECG dated 08/10/2021 was personally reviewed today and demonstrated:  Atrial fibrillation.  Physical Exam   GEN: No acute distress.   Neck: No JVD. Cardiac: Irregularly irregular without gallop.  Respiratory: Nonlabored.  No wheezing. GI: Soft, nontender, bowel sounds present. MS: No pitting edema. Neuro: Sedated. Psych: Sedated.  Labs    Chemistry Recent Labs  Lab 08/07/21 0556 08/08/21 0423 08/09/21 1623 08/10/21 0350 08/11/21 0356 08/12/21 0510  NA 134*   < > 131* 131* 134* 137  K 5.0   < > 5.0 3.9 4.2 4.4  CL 102   < > 98 99 100 103  CO2 26   < > _0 GLUCOSE 194*   < > 223* 239* 208* 286*  BUN 24*   < > 28* 29* 40* 62*  CREATININE 1.08   < > 1.06 0.92 0.97 1.32*  CALCIUM 8.6*   < > 8.1* 7.9* 7.9* 8.1*  PROT 6.9  --  7.0  --  5.7*  --   ALBUMIN 3.4*  --  3.3*  --  2.5*  --   AST 24  --  28  --  15  --   ALT 50*  --  47*  --  31  --   ALKPHOS 96  --  101  --  75  --   BILITOT 1.0  --  2.1*  --  0.7  --   GFRNONAA >60   < > >60 >60 >60 60*  ANIONGAP 6   < > _0 < > = values in this interval not displayed.     Hematology Recent Labs  Lab 08/10/21 0350 08/11/21 0356 08/12/21 0510  WBC 15.2* 17.4* 21.6*  RBC 4.40 4.23 4.24  HGB 12.6* 12.1* 12.0*  HCT 37.9* 37.0* 37.5*  MCV 86.1 87.5 88.4  MCH 28.6 28.6 28.3  MCHC 33.2 32.7 32.0  RDW 13.9 14.0 14.1  PLT 197 182 167    Cardiac Enzymes Recent Labs  Lab 08/06/21 2140 08/06/21 2348 08/10/21 0350  TROPONINIHS _1 BNP Recent Labs  Lab 08/06/21 1908 08/09/21 0429  BNP 75.0 78.0     Radiology    DG CHEST PORT 1 VIEW  Result Date: 08/11/2021 CLINICAL DATA:  Hypoxia and respiratory failure. EXAM: PORTABLE CHEST 1 VIEW COMPARISON:  Portable chest earlier today at 6:25 a.m. FINDINGS: 08/10/2021 at 11:44 p.m. the heart is enlarged. Central vessels are normal caliber. Emphysematous and chronic interstitial changes are present. Again noted is a small infiltrate in the right  upper lobe apex, and hazy atelectasis or pneumonitis in the right base. On the current exam there is increasingly dense left lower lobe consolidation, and small increased left pleural effusion. No other new opacity is seen. ETT tip is 5 cm from the carina, NGT tip in the body of stomach based on the positioning of the proximal side hole with the tip not included, left IJ central line tip in the SVC. IMPRESSION: 1. Increasingly dense left lower lobe consolidation and small left pleural effusion. 2. Unchanged small infiltrate in the right apex, with hazy right basilar atelectasis or pneumonitis. 3. Cardiomegaly without findings of CHF. Electronically Signed   By: Telford Nab M.D.   On: 08/11/2021 00:17   ECHOCARDIOGRAM COMPLETE  Result Date: 08/10/2021    ECHOCARDIOGRAM REPORT   Patient Name:   Shaun [name] Date of Exam: 08/10/2021 Medical Rec #:  [id number]          Height:       68.0 in Accession #:    [id number]         Weight:       202.8 lb Date of Birth:  1956-02-27         BSA:          2.056 m Patient Age:    65 years           BP:           106/68 mmHg Patient Gender: M                  HR:           97 bpm. Exam Location:  [facility] Procedure: 2D Echo, Cardiac Doppler and Color Doppler Indications:    Atrial Fibrillation I48.91  History:        Patient has prior history of Echocardiogram examinations, most                 recent 04/08/2019. CHF, CAD; Risk Factors:Hypertension and                 Dyslipidemia. Sleep Apnea.  Sonographer:    Ilona Sorrel  White RCS Referring Phys: [place] Comments: Patient on mechanical ventilator at time of echo exam. IMPRESSIONS  1. Left ventricular ejection fraction, by estimation, is 60 to 65%. The left ventricle has normal function. The left ventricle has no regional wall motion abnormalities. There is moderate concentric left ventricular hypertrophy. Left ventricular diastolic parameters are indeterminate.  2. Right ventricular systolic  function is normal. The right ventricular size is normal. Tricuspid regurgitation signal is inadequate for assessing PA pressure.  3. Left atrial size was mildly dilated.  4. The mitral valve is grossly normal. Trivial mitral valve regurgitation.  5. The aortic valve is tricuspid. Aortic valve regurgitation is not visualized.  6. Unable to estimate CVP. Comparison(s): Prior images reviewed side by side. LVEF stable at 60-65%. FINDINGS  Left Ventricle: Left ventricular ejection fraction, by estimation, is 60 to 65%. The left ventricle has normal function. The left ventricle has no regional wall motion abnormalities. The left ventricular internal cavity size was normal in size. There is  moderate concentric left ventricular hypertrophy. Left ventricular diastolic function could not be evaluated due to atrial fibrillation. Left ventricular diastolic parameters are indeterminate. Right Ventricle: The right ventricular size is normal. No increase in right ventricular wall thickness. Right ventricular systolic function is normal. Tricuspid regurgitation signal is inadequate for assessing PA pressure. Left Atrium: Left atrial size was mildly dilated. Right Atrium: Right atrial size was normal in size. Pericardium: There is no evidence of pericardial effusion. Presence of epicardial fat layer. Mitral Valve: The mitral valve is grossly normal. Trivial mitral valve regurgitation. Tricuspid Valve: The tricuspid valve is grossly normal. Tricuspid valve regurgitation is trivial. Aortic Valve: The aortic valve is tricuspid. There is mild to moderate aortic valve annular calcification. Aortic valve regurgitation is not visualized. Pulmonic Valve: The pulmonic valve was not well visualized. Pulmonic valve regurgitation is trivial. Aorta: The aortic root is normal in size and structure. Venous: Unable to estimate CVP. IVC assessment for right atrial pressure unable to be performed due to mechanical ventilation. IAS/Shunts: No atrial  level shunt detected by color flow Doppler.  LEFT VENTRICLE PLAX 2D LVIDd:         4.60 cm LVIDs:         2.90 cm LV PW:         1.30 cm LV IVS:        1.40 cm LVOT diam:     1.90 cm LV SV:         46 LV SV Index:   22 LVOT Area:     2.84 cm  RIGHT VENTRICLE RV S prime:     10.70 cm/s TAPSE (M-mode): 1.8 cm LEFT ATRIUM             Index        RIGHT ATRIUM           Index LA diam:        4.10 cm 1.99 cm/m   RA Area:     18.50 cm LA Vol (A2C):   84.4 ml 41.05 ml/m  RA Volume:   52.00 ml  25.29 ml/m LA Vol (A4C):   75.7 ml 36.82 ml/m LA Biplane Vol: 79.8 ml 38.81 ml/m  AORTIC VALVE LVOT Vmax:   91.70 cm/s LVOT Vmean:  58.300 cm/s LVOT VTI:    0.161 m  AORTA Ao Root diam: 3.30 cm MITRAL VALVE MV Area (PHT): 4.15 cm    SHUNTS MV Decel Time: 183 msec  Systemic VTI:  0.16 m MV E velocity: 86.40 cm/s  Systemic Diam: 1.90 cm Rozann Lesches MD Electronically signed by Rozann Lesches MD Signature Date/Time: 08/10/2021/8:24:44 PM    Final     Assessment & Plan    1.  Permanent atrial fibrillation with CHA2DS2-VASc score of 4-5.  He has had recent RVR in the setting of acute hypoxic respiratory failure with influenza A.  He continues on IV amiodarone for heart rate control, beta-blocker held at this point. Eliquis transitioned to Lovenox.  High-sensitivity troponin I levels normal.  Follow-up echocardiogram reveals reserved LVEF at 60 to 65% with moderate LVH.  Heart rate 90-110 on average.  2.  Hypoxic respiratory failure with ventilator requirement in the setting of influenza A and possibly associated acute on chronic diastolic heart failure.  Follow-up chest x-ray pending.  Currently on empiric antibiotics with Maxipime, completed course of Tamiflu.  Intake and output relatively even last 24 hours with Lasix IV 40 mg.  Not requiring pressors at this time.  3.  History of CAD status post DES to the distal circumflex in 2016, patent at angiography in 2018 with mild to moderate residual CAD.  Continue  Lipitor, Lopressor presently on hold.  Continue IV amiodarone and Lovenox.  Repeat Lasix 40 IV today.  Pulmonary/CCM continues to follow, working on transfer to [facility].  Cardiology service will continue to follow.  Signed, Rozann Lesches, MD  08/12/2021, 8:51 AM

## 2021-08-12 NOTE — Progress Notes (Signed)
PROGRESS NOTE    Patient: Shaun [name]                            PCP: Maximiano Coss, NP                    DOB: October 19, 1955            DOA: 08/06/2021 UXN:[id number]             DOS: 08/12/2021, 6:22 PM   LOS: 6 days   Date of Service: The patient was seen and examined on 08/12/2021  Subjective:   The patient was seen and examined this morning, still intubated on the vent, on amiodarone drip... Tachycardic,   Note: 08/12/21--remains intubated and sedated, vent settings adjusted, discussed with pulmonologist, wife and daughter at bedside, -Weaned off pressors -Remains on IV amiodarone  -Patient's wife and daughter aware of plan to transfer to [facility] ICU when bed becomes available   Brief Narrative:   Shaun [name]  is a 65 y.o. male, with history of anxiety, ASCVD, CHF, COPD, diabetes mellitus type 2, hyperlipidemia, hypertension, obstructive sleep apnea, paroxysmal atrial fibrillation, and more presents ED with chief complaint of dyspnea.  Patient reports he has had fever and dyspnea for 1 week.  He reports that he was diagnosed with the flu 6 days ago.  He was given a steroid and Tessalon Perles per his report.  He reports that it was not helpful.  He had a T-max at home of 102.  He tried Tylenol and thinks that he had temporary relief.  He has a cough but is only occasionally productive with green sputum.   He does not wear oxygen at home.  He does sleep with a CPAP at home.  Pa   Patient smokes half a pack a day but declines a nicotine patch as he has not had a cigarette since he has been ill.   Patient is vaccinated for COVID but not flu.  Patient is full code.  ED Temp 99.7, heart rate 101-125, respiratory rate 16-25, blood pressure 125/88, satting 96% on 3 L nasal cannula No leukocytosis with white blood cell count 10.3, hemoglobin 15 Chemistry panel reveals a decreased bicarb at 21 and an AKI with a creatinine of 1.43 Patient is fluid positive Chest x-ray  shows no active disease    Assessment & Plan:   Principal Problem:   Acute respiratory failure with hypoxemia (HCC) Active Problems:   Chronic atrial fibrillation (HCC)   Tobacco abuse   Obstructive sleep apnea   Essential hypertension   Non-insulin treated type 2 diabetes mellitus (HCC)   COPD (chronic obstructive pulmonary disease) (HCC)   Chronic diastolic CHF (congestive heart failure) (HCC)   Influenza   Endotracheally intubated  Principal Problem:    Acute respiratory failure with hypoxia (HCC)-due to influenza A  -08/09/2021-worsening respiratory failure, hypoxia  - 12/12 -status post intubation -on the vent -12/12 -left IJ placed 08/12/21--remains intubated and sedated, vent settings adjusted, discussed with pulmonologist, , -Weaned off pressors -Remains on IV amiodarone -On vent PRVC  75%/24/10/480 -Subsequent ABGs chest x-rays reviewed -Continue  iv Solu-Medrol 40 mg twice daily - DuoNeb bronchodilators, mucolytic, -Continue Tamiflu PCCM Dr. Melvyn Novas consulted appreciate his close follow-up --Patient's wife and daughter aware of plan to transfer to [facility] ICU when bed becomes available   Sepsis due to bacterial pneumonia superimposed on Influenza -Met Sepsis criteria-due to respiratory failure, fever,  leukocytosis, tachycardic, tachypneic with lactic acidosis - -Status post IV fluid resuscitation, treated with  Tamiflu -Continue IV cefepime which was initiated 08/09/2021--may be de-escalated as patient status improves -Blood cultures obtained 08/09/2021-NGTD -Urinalysis negative for any leukocyte Estrace WBC 0-5   A. fib with RVR -Cardiologist Dr. Domenic Polite consulted, following closely, Per recommendation patient remains on amiodarone drip -Lovenox therapeutic dose initiated  Acute renal insufficiency -Baseline creatinine around 0.89, Cr. On admission>> 1.43 >>> 1.08 >> 0.93, 0.92 -Holding ACE inhibitor's and Lasix -We will try to avoid  nephrotoxins -Monitor kidney function closely   Active Problems:  Chronic atrial fibrillation (HCC) -home medication Lopressor and Eliquis  Tobacco abuse - would benefit from smoking cessation  Obstructive sleep apnea -currently intubated on the vent    Essential hypertension -holding home BP meds    Non-insulin treated type 2 diabetes mellitus (HCC) -Use Novolog/Humalog Sliding scale insulin with Accu-Cheks/Fingersticks as ordered     COPD (chronic obstructive pulmonary disease) (HCC) -management as above   Chronic diastolic CHF (congestive heart failure) (HCC) -monitoring daily weights, I's and O's, holding Lasix   CRITICAL CARE Performed by: Roxan Hockey   Total critical care time: 46 minutes  Critical care time was exclusive of separately billable procedures and treating other patients.  Critical care was necessary to treat or prevent imminent or life-threatening deterioration. -  08/12/21--remains intubated and sedated, vent settings adjusted, discussed with pulmonologist, wife at bedside, -Weaned off pressors -Remains on IV amiodarone --Patient's wife and daughter aware of plan to transfer to [facility] ICU when bed becomes available  Critical care was time spent personally by me on the following activities: development of treatment plan with patient and/or surrogate as well as nursing, discussions with consultants, evaluation of patient's response to treatment, examination of patient, obtaining history from patient or surrogate, ordering and performing treatments and interventions, ordering and review of laboratory studies, ordering and review of radiographic studies, pulse oximetry and re-evaluation of patient's condition.   ------------------------------------------------------------------------------------------------------------------------------------------ Cultures; Blood Cultures x 2 >> NGTD Sputum cultures >>   Antimicrobials: Tamiflu Cefepime  08/09/2021  Consultants: PCCM--Dr Melvyn Novas and Cardiology Dr Domenic Polite  -------------------------------------------------------------------------------------------------------------------------------------  DVT prophylaxis:  SCD/Compression stockings and NU:UVOZDGU Code Status:   Code Status: Full Code  Family Communication: Discussed with   wife at bedside   Admission status:   Status is: Inpatient  Remains inpatient appropriate because: In acute respiratory failure, meeting sepsis criteria, intubated, on vent, hypotensive, A. fib with RVR... Requiring vent management, blood pressure management, heart rate management, treating underlying infection..   Level of care: ICU --Patient's wife and daughter aware of plan to transfer to [facility] ICU when bed becomes available  Procedures:   No admission procedures for hospital encounter.    Antimicrobials:  Anti-infectives (From admission, onward)    Start     Dose/Rate Route Frequency Ordered Stop   08/10/21 0015  oseltamivir (TAMIFLU) capsule 75 mg        75 mg Per Tube 2 times daily 08/09/21 2316 08/11/21 0944   08/09/21 1745  ceFEPIme (MAXIPIME) 2 g in sodium chloride 0.9 % 100 mL IVPB  Status:  Discontinued        2 g 200 mL/hr over 30 Minutes Intravenous  Once 08/09/21 1657 08/09/21 1702   08/09/21 1730  ceFEPIme (MAXIPIME) 2 g in sodium chloride 0.9 % 100 mL IVPB        2 g 200 mL/hr over 30 Minutes Intravenous Every 8 hours 08/09/21 1702  08/07/21 0800  oseltamivir (TAMIFLU) capsule 75 mg  Status:  Discontinued        75 mg Oral 2 times daily 08/07/21 0711 08/09/21 2316        Medication:   acetylcysteine  2 mL Nebulization QID   ARIPiprazole  2 mg Per Tube Daily   atorvastatin  80 mg Per Tube Daily   chlorhexidine gluconate (MEDLINE KIT)  15 mL Mouth Rinse BID   Chlorhexidine Gluconate Cloth  6 each Topical Daily   docusate  100 mg Per Tube BID   enoxaparin (LOVENOX) injection  95 mg Subcutaneous Q12H    escitalopram  20 mg Per Tube Daily   feeding supplement (PROSource TF)  45 mL Per Tube BID   free water  250 mL Per Tube Q4H   insulin aspart  0-15 Units Subcutaneous Q4H   levalbuterol  1.25 mg Nebulization QID   mouth rinse  15 mL Mouth Rinse 10 times per day   methylPREDNISolone (SOLU-MEDROL) injection  40 mg Intravenous Q12H   mupirocin ointment   Nasal BID   pantoprazole (PROTONIX) IV  40 mg Intravenous Q24H   sodium chloride flush  10-40 mL Intracatheter Q12H    acetaminophen **OR** acetaminophen, bisacodyl, ondansetron **OR** ondansetron (ZOFRAN) IV, sodium chloride flush   Objective:   Vitals:   08/12/21 1641 08/12/21 1700 08/12/21 1701 08/12/21 1704  BP:  111/71    Pulse:  (!) 112  (!) 108  Resp:   19   Temp: 98.4 F (36.9 C)     TempSrc: Axillary     SpO2:  94%    Weight:      Height:        Intake/Output Summary (Last 24 hours) at 08/12/2021 1822 Last data filed at 08/12/2021 1628 Gross per 24 hour  Intake 939.24 ml  Output 1700 ml  Net -760.76 ml   Filed Weights   08/10/21 0315 08/11/21 0400 08/12/21 0401  Weight: 92 kg 91.7 kg 94.3 kg     Examination:    Physical Exam:   General:  Sedated-intubated  HEENT:  ET tube in good position on the vent  Neuro:  Sedated on IV  propofol gtt   Lungs:   Positive air sounds throughout the lung fields, mild rhonchi diffusely no wheezing or crackles  Cardio:    Tachycardic A. fib on amiodarone drip, negative for  rubs or clicks  Abdomen:   Soft, hypoactive bowel sounds,  peritoneal signs.  Muscular skeletal:  Definite peripheral edema, positive pulses, sedated  Skin:  Dry, warm to touch, negative for any Rashes,  Wounds: Please see nursing documentation    Lt IJ central Line Foley        -----------------------------------------------------------------------------------------------------------------------------    LABs:  CBC Latest Ref Rng & Units 08/12/2021 08/11/2021 08/10/2021  WBC 4.0 - 10.5 K/uL  21.6(H) 17.4(H) 15.2(H)  Hemoglobin 13.0 - 17.0 g/dL 12.0(L) 12.1(L) 12.6(L)  Hematocrit 39.0 - 52.0 % 37.5(L) 37.0(L) 37.9(L)  Platelets 150 - 400 K/uL 167 182 197   CMP Latest Ref Rng & Units 08/12/2021 08/11/2021 08/10/2021  Glucose 70 - 99 mg/dL 286(H) 208(H) 239(H)  BUN 8 - 23 mg/dL 62(H) 40(H) 29(H)  Creatinine 0.61 - 1.24 mg/dL 1.32(H) 0.97 0.92  Sodium 135 - 145 mmol/L 137 134(L) 131(L)  Potassium 3.5 - 5.1 mmol/L 4.4 4.2 3.9  Chloride 98 - 111 mmol/L 103 100 99  CO2 22 - 32 mmol/L _0 Calcium 8.9 - 10.3 mg/dL 8.1(L) 7.9(L) 7.9(L)  Total Protein 6.5 - 8.1 g/dL - 5.7(L) -  Total Bilirubin 0.3 - 1.2 mg/dL - 0.7 -  Alkaline Phos 38 - 126 U/L - 75 -  AST 15 - 41 U/L - 15 -  ALT 0 - 44 U/L - 31 -       Micro Results Recent Results (from the past 240 hour(s))  Resp Panel by RT-PCR (Flu A&B, Covid) Nasopharyngeal Swab     Status: Abnormal   Collection Time: 08/06/21  7:28 PM   Specimen: Nasopharyngeal Swab; Nasopharyngeal(NP) swabs in vial transport medium  Result Value Ref Range Status   SARS Coronavirus 2 by RT PCR NEGATIVE NEGATIVE Final    Comment: (NOTE) SARS-CoV-2 target nucleic acids are NOT DETECTED.  The SARS-CoV-2 RNA is generally detectable in upper respiratory specimens during the acute phase of infection. The lowest concentration of SARS-CoV-2 viral copies this assay can detect is 138 copies/mL. A negative result does not preclude SARS-Cov-2 infection and should not be used as the sole basis for treatment or other patient management decisions. A negative result may occur with  improper specimen collection/handling, submission of specimen other than nasopharyngeal swab, presence of viral mutation(s) within the areas targeted by this assay, and inadequate number of viral copies(<138 copies/mL). A negative result must be combined with clinical observations, patient history, and epidemiological information. The expected result is Negative.  Fact Sheet  for Patients:  EntrepreneurPulse.com.au  Fact Sheet for Healthcare Providers:  IncredibleEmployment.be  This test is no t yet approved or cleared by the [place] FDA and  has been authorized for detection and/or diagnosis of SARS-CoV-2 by FDA under an Emergency Use Authorization (EUA). This EUA will remain  in effect (meaning this test can be used) for the duration of the COVID-19 declaration under Section 564(b)(1) of the Act, 21 U.S.C.section 360bbb-3(b)(1), unless the authorization is terminated  or revoked sooner.       Influenza A by PCR POSITIVE (A) NEGATIVE Final   Influenza B by PCR NEGATIVE NEGATIVE Final    Comment: (NOTE) The Xpert Xpress SARS-CoV-2/FLU/RSV plus assay is intended as an aid in the diagnosis of influenza from Nasopharyngeal swab specimens and should not be used as a sole basis for treatment. Nasal washings and aspirates are unacceptable for Xpert Xpress SARS-CoV-2/FLU/RSV testing.  Fact Sheet for Patients: EntrepreneurPulse.com.au  Fact Sheet for Healthcare Providers: IncredibleEmployment.be  This test is not yet approved or cleared by the [place] FDA and has been authorized for detection and/or diagnosis of SARS-CoV-2 by FDA under an Emergency Use Authorization (EUA). This EUA will remain in effect (meaning this test can be used) for the duration of the COVID-19 declaration under Section 564(b)(1) of the Act, 21 U.S.C. section 360bbb-3(b)(1), unless the authorization is terminated or revoked.  Performed at [facility], [address] [place], [place] [address]   MRSA Next Gen by PCR, Nasal     Status: Abnormal   Collection Time: 08/09/21  3:03 PM   Specimen: Nasal Mucosa; Nasal Swab  Result Value Ref Range Status   MRSA by PCR Next Gen DETECTED (A) NOT DETECTED Final    Comment: RESULT CALLED TO, READ BACK BY AND VERIFIED WITH: KARA, RN @ [phone] ON 08/10/21 C  VARNER (NOTE) The GeneXpert MRSA Assay (FDA approved for NASAL specimens only), is one component of a comprehensive MRSA colonization surveillance program. It is not intended to diagnose MRSA infection nor to guide or monitor treatment for MRSA infections. Test performance is not FDA approved in  patients less than 45 years old. Performed at [facility] [address], [place], [place] [address]   Culture, blood (routine x 2)     Status: None (Preliminary result)   Collection Time: 08/09/21  5:22 PM   Specimen: BLOOD  Result Value Ref Range Status   Specimen Description BLOOD  Final   Special Requests NONE  Final   Culture   Final    NO GROWTH < 12 HOURS Performed at [facility], [address] [place], [place] [address]    Report Status PENDING  Incomplete  Culture, blood (routine x 2)     Status: None (Preliminary result)   Collection Time: 08/09/21  5:22 PM   Specimen: BLOOD  Result Value Ref Range Status   Specimen Description BLOOD  Final   Special Requests NONE  Final   Culture   Final    NO GROWTH < 12 HOURS Performed at [facility], [address], [place], [place] [address]    Report Status PENDING  Incomplete  Expectorated Sputum Assessment w Gram Stain, Rflx to Resp Cult     Status: None   Collection Time: 08/10/21 11:10 AM   Specimen: Expectorated Sputum  Result Value Ref Range Status   Specimen Description EXPECTORATED SPUTUM  Final   Special Requests NONE  Final   Sputum evaluation   Final    THIS SPECIMEN IS ACCEPTABLE FOR SPUTUM CULTURE Performed at [facility], [address] [place], [place] [address]    Report Status 08/10/2021 FINAL  Final  Culture, Respiratory w Gram Stain     Status: None   Collection Time: 08/10/21 11:10 AM  Result Value Ref Range Status   Specimen Description   Final    EXPECTORATED SPUTUM Performed at [facility], [address], [place], [place] [address]    Special Requests   Final    NONE Reflexed from [id number] Performed  at [facility], [address] [place], [place] [address]    Gram Stain   Final    ABUNDANT WBC PRESENT,BOTH PMN AND MONONUCLEAR RARE GRAM POSITIVE COCCI RARE GRAM NEGATIVE RODS    Culture   Final    FEW Normal respiratory flora-no Staph aureus or Pseudomonas seen Performed at [facility], [place] [address], [place], [place] [address]    Report Status 08/12/2021 FINAL  Final    Radiology Reports DG Chest Port 1 View  Result Date: 08/12/2021 CLINICAL DATA:  Acute respiratory failure with hypoxemia. EXAM: PORTABLE CHEST 1 VIEW COMPARISON:  08/10/2021 FINDINGS: An endotracheal tube remains in place terminating proximally 5 cm above the carina. An enteric tube courses into the abdomen with tip not imaged. A left jugular catheter terminates over the upper SVC, unchanged. The patient is rotated to the right with persistent mild enlargement of the cardiac silhouette. Lung volumes remain low with mild central pulmonary vascular congestion. Patchy airspace opacities are present in the left greater than right lung bases with improved aeration of the lung bases compared to the prior study, particularly on the left. No sizable pleural effusion or pneumothorax is identified. IMPRESSION: Improved aeration of the lung bases. Electronically Signed   By: Logan Bores M.D.   On: 08/12/2021 09:06   DG CHEST PORT 1 VIEW  Result Date: 08/11/2021 CLINICAL DATA:  Hypoxia and respiratory failure. EXAM: PORTABLE CHEST 1 VIEW COMPARISON:  Portable chest earlier today at 6:25 a.m. FINDINGS: 08/10/2021 at 11:44 p.m. the heart is enlarged. Central vessels are normal caliber. Emphysematous and  chronic interstitial changes are present. Again noted is a small infiltrate in the right upper lobe apex, and hazy atelectasis or pneumonitis in the right base. On the current exam there is increasingly dense left lower lobe consolidation, and small increased left pleural effusion. No other new opacity is seen. ETT tip is 5 cm  from the carina, NGT tip in the body of stomach based on the positioning of the proximal side hole with the tip not included, left IJ central line tip in the SVC. IMPRESSION: 1. Increasingly dense left lower lobe consolidation and small left pleural effusion. 2. Unchanged small infiltrate in the right apex, with hazy right basilar atelectasis or pneumonitis. 3. Cardiomegaly without findings of CHF. Electronically Signed   By: Telford Nab M.D.   On: 08/11/2021 00:17   DG CHEST PORT 1 VIEW  Result Date: 08/10/2021 CLINICAL DATA:  Difficulty breathing EXAM: PORTABLE CHEST 1 VIEW COMPARISON:  Previous studies including the examination is done on 08/09/2021 FINDINGS: Transverse diameter of heart is increased. Central pulmonary vessels are slightly more prominent. Interstitial markings in the left parahilar region and lower lung fields appear more prominent. There is no focal consolidation. There is no significant pleural effusion or pneumothorax. Tip of endotracheal is 5.1 cm above the carina. Tip of central venous catheter is seen in the superior vena cava. Enteric tube is noted traversing the esophagus. Distal portion of the enteric tube is seen in the stomach. Tip is not included in the radiograph. IMPRESSION: Cardiomegaly. Interstitial markings in the parahilar regions and lower lung fields appear more prominent. This may suggest mild interstitial edema or interstitial pneumonitis or an artifact due to differences in the techniques. Electronically Signed   By: Elmer Picker M.D.   On: 08/10/2021 09:52   DG CHEST PORT 1 VIEW  Result Date: 08/09/2021 CLINICAL DATA:  Shortness of breath EXAM: PORTABLE CHEST 1 VIEW COMPARISON:  08/07/2021.  09/19/2018. FINDINGS: Chronic cardiomegaly and aortic atherosclerosis. Worsened venous hypertension, possibly with mild interstitial edema. No visible effusion. There could also be a bronchial thickening pattern consistent with bronchitis. IMPRESSION: Probable fluid  overload/low level congestive heart failure. Cardiomegaly, venous hypertension and mild interstitial edema. Question also bronchial thickening that could go along with bronchitis. Electronically Signed   By: Nelson Chimes M.D.   On: 08/09/2021 13:17   Portable chest 1 View  Result Date: 08/07/2021 CLINICAL DATA:  65 year old male with respiratory failure.  Smoker. EXAM: PORTABLE CHEST 1 VIEW COMPARISON:  Portable chest 08/06/2021 and earlier. FINDINGS: Portable AP upright view at 0439 hours. Stable lung volumes and mediastinal contours. Borderline cardiomegaly. Stable lung markings. Allowing for portable technique the lungs are clear. No pneumothorax or pleural effusion. Visualized tracheal air column is within normal limits. No acute osseous abnormality identified. IMPRESSION: No acute cardiopulmonary abnormality. Electronically Signed   By: Genevie Ann M.D.   On: 08/07/2021 04:59   DG Chest Port 1 View  Result Date: 08/06/2021 CLINICAL DATA:  Shortness of breath EXAM: PORTABLE CHEST 1 VIEW COMPARISON:  09/21/2018 FINDINGS: Cardiomegaly. No confluent opacities, effusions or edema. No acute bony abnormality. IMPRESSION: Cardiomegaly.  No active disease. Electronically Signed   By: Rolm Baptise M.D.   On: 08/06/2021 19:45   DG Chest Port 1V same Day  Result Date: 08/09/2021 CLINICAL DATA:  Central line placement. EXAM: PORTABLE CHEST 1 VIEW COMPARISON:  Chest radiograph dated 08/09/2021. FINDINGS: Endotracheal tube with tip approximately 4.5 cm above the carina. Enteric tube extends below the diaphragm with tip in the  left upper abdomen, likely in the proximal stomach. Left IJ central venous line with tip over central SVC. Mild cardiomegaly with mild vascular congestion. Left lung base density may represent atelectasis. Developing infiltrate is not excluded. No large pleural effusion. No pneumothorax. No acute osseous pathology. Degenerative changes of spine. IMPRESSION: 1. Endotracheal tube above the  carina. 2. Left IJ central venous line with tip over central SVC. No pneumothorax. Mild cardiomegaly and vascular congestion. Electronically Signed   By: Anner Crete M.D.   On: 08/09/2021 19:59   DG Chest Port 1V same Day  Result Date: 08/09/2021 CLINICAL DATA:  65 year old male with intubation EXAM: PORTABLE CHEST 1 VIEW COMPARISON:  08/09/2021, 08/07/2021 FINDINGS: Cardiomediastinal silhouette unchanged in size and contour. No evidence of central vascular congestion. No interlobular septal thickening. Interval placement of endotracheal tube, terminating suitably above the carina, 3.9 cm. Interval placement of gastric tube projecting over the mediastinum and terminating within the left upper quadrant Low lung volumes with no confluent airspace disease. Coarsened interstitial markings similar to the recent comparison chest x-rays. No pneumothorax or pleural effusion. No acute displaced fracture. Similar appearance lytic changes at the distal right clavicle IMPRESSION: Interval placement of endotracheal tube, terminating suitably above the carina. Gastric tube terminates in the stomach. Low lung volumes with likely atelectasis at the bases Electronically Signed   By: Corrie Mckusick D.O.   On: 08/09/2021 16:28   ECHOCARDIOGRAM COMPLETE  Result Date: 08/10/2021    ECHOCARDIOGRAM REPORT   Patient Name:   CLIFFTON SPRADLEY Date of Exam: 08/10/2021 Medical Rec #:  [id number]          Height:       68.0 in Accession #:    [id number]         Weight:       202.8 lb Date of Birth:  [birth year]         BSA:          2.056 m Patient Age:    65 years           BP:           106/68 mmHg Patient Gender: M                  HR:           97 bpm. Exam Location:  [facility] Procedure: 2D Echo, Cardiac Doppler and Color Doppler Indications:    Atrial Fibrillation I48.91  History:        Patient has prior history of Echocardiogram examinations, most                 recent 04/08/2019. CHF, CAD; Risk Factors:Hypertension and                  Dyslipidemia. Sleep Apnea.  Sonographer:    Alvino Chapel RCS Referring Phys: [place] Comments: Patient on mechanical ventilator at time of echo exam. IMPRESSIONS  1. Left ventricular ejection fraction, by estimation, is 60 to 65%. The left ventricle has normal function. The left ventricle has no regional wall motion abnormalities. There is moderate concentric left ventricular hypertrophy. Left ventricular diastolic parameters are indeterminate.  2. Right ventricular systolic function is normal. The right ventricular size is normal. Tricuspid regurgitation signal is inadequate for assessing PA pressure.  3. Left atrial size was mildly dilated.  4. The mitral valve is grossly normal. Trivial mitral valve regurgitation.  5. The aortic valve is tricuspid. Aortic valve regurgitation  is not visualized.  6. Unable to estimate CVP. Comparison(s): Prior images reviewed side by side. LVEF stable at 60-65%. FINDINGS  Left Ventricle: Left ventricular ejection fraction, by estimation, is 60 to 65%. The left ventricle has normal function. The left ventricle has no regional wall motion abnormalities. The left ventricular internal cavity size was normal in size. There is  moderate concentric left ventricular hypertrophy. Left ventricular diastolic function could not be evaluated due to atrial fibrillation. Left ventricular diastolic parameters are indeterminate. Right Ventricle: The right ventricular size is normal. No increase in right ventricular wall thickness. Right ventricular systolic function is normal. Tricuspid regurgitation signal is inadequate for assessing PA pressure. Left Atrium: Left atrial size was mildly dilated. Right Atrium: Right atrial size was normal in size. Pericardium: There is no evidence of pericardial effusion. Presence of epicardial fat layer. Mitral Valve: The mitral valve is grossly normal. Trivial mitral valve regurgitation. Tricuspid Valve: The tricuspid  valve is grossly normal. Tricuspid valve regurgitation is trivial. Aortic Valve: The aortic valve is tricuspid. There is mild to moderate aortic valve annular calcification. Aortic valve regurgitation is not visualized. Pulmonic Valve: The pulmonic valve was not well visualized. Pulmonic valve regurgitation is trivial. Aorta: The aortic root is normal in size and structure. Venous: Unable to estimate CVP. IVC assessment for right atrial pressure unable to be performed due to mechanical ventilation. IAS/Shunts: No atrial level shunt detected by color flow Doppler.  LEFT VENTRICLE PLAX 2D LVIDd:         4.60 cm LVIDs:         2.90 cm LV PW:         1.30 cm LV IVS:        1.40 cm LVOT diam:     1.90 cm LV SV:         46 LV SV Index:   22 LVOT Area:     2.84 cm  RIGHT VENTRICLE RV S prime:     10.70 cm/s TAPSE (M-mode): 1.8 cm LEFT ATRIUM             Index        RIGHT ATRIUM           Index LA diam:        4.10 cm 1.99 cm/m   RA Area:     18.50 cm LA Vol (A2C):   84.4 ml 41.05 ml/m  RA Volume:   52.00 ml  25.29 ml/m LA Vol (A4C):   75.7 ml 36.82 ml/m LA Biplane Vol: 79.8 ml 38.81 ml/m  AORTIC VALVE LVOT Vmax:   91.70 cm/s LVOT Vmean:  58.300 cm/s LVOT VTI:    0.161 m  AORTA Ao Root diam: 3.30 cm MITRAL VALVE MV Area (PHT): 4.15 cm    SHUNTS MV Decel Time: 183 msec    Systemic VTI:  0.16 m MV E velocity: 86.40 cm/s  Systemic Diam: 1.90 cm Rozann Lesches MD Electronically signed by Rozann Lesches MD Signature Date/Time: 08/10/2021/8:24:44 PM    Final     SIGNED: Roxan Hockey, MD,   [facility],  Pager (please use [email or web address] to page/text) Please use Epic Secure Chat for non-urgent communication (7AM-7PM)  If 7PM-7AM, please contact night-coverage [email or web address], 08/12/2021, 6:22 PM

## 2021-08-12 NOTE — Progress Notes (Signed)
NAME:  Shaun [name], MRN:  [id number], DOB:  Oct 06, 1955, LOS: 6 ADMISSION DATE:  08/06/2021, CONSULTATION DATE:  12/12 REFERRING MD:  Richarda Blade, CHIEF COMPLAINT:  resp distress    History of Present Illness:     79 yowm active smoker with nl pfts 06/2019 with history of anxiety, ASCVD, CHF,  diabetes mellitus type 2, hyperlipidemia, hypertension, obstructive sleep apnea, CAF and more presented to ED with chief complaint of dyspnea.  Patient reports he has had fever and dyspnea for 1 week PTA. He reports that he was diagnosed with the flu 6 days PTA.  He was given a steroid and Tessalon Perles per his report.   He had a T-max at home of 102.    Patient does admit to orthopnea but no increase in peripheral edema.  He has a cough but is only occasionally productive with green sputum.  No hemoptysis.  He does have some chest tightness that is been going on for the whole week.  It is worse with exertion, better with rest.  Worse lying  flat as well.  Patient reports feeling lightheaded when he has the chest tightness.    He does not wear oxygen at home.    He does sleep with a CPAP at home.  Patient does report decreased p.o. intake.  Yesterday all he had all day was chicken soup.  Last bowel movement was yesterday.      The ED Temp 99.7, heart rate 101-125, respiratory rate 16-25, blood pressure 125/88, satting 96% on 3 L nasal cannula  Was improving but rapidly worse sob / desats 12/12 requiring bipap and then intubation by anesthesia         Pertinent  Medical History  CAF - no recent echo, lat one 03/2019  Mod LAE with elevated LVEDP HBP DM OSA on CPAP    Significant Hospital Events: Including procedures, antibiotic start and stop dates in addition to other pertinent events   Resp viral panel 12/9 >  POS FLU Oral ET  12/12  L IJ  CVL 12/12 Placed on Amiodarone drip 12/12 MRSA screen 12/12   POS BC x 2 12/12 >>> Trach sample  12/13 mixed orgs, ambundant wbc>>> nl flora 12/13  Echo Mod lvh, mild LAE 12/14 added mucomyst for LLL Atx> improved 12/15    Scheduled Meds:  acetylcysteine  2 mL Nebulization QID   ARIPiprazole  2 mg Per Tube Daily   atorvastatin  80 mg Per Tube Daily   chlorhexidine gluconate (MEDLINE KIT)  15 mL Mouth Rinse BID   Chlorhexidine Gluconate Cloth  6 each Topical Daily   docusate  100 mg Per Tube BID   enoxaparin (LOVENOX) injection  95 mg Subcutaneous Q12H   escitalopram  20 mg Per Tube Daily   feeding supplement (PROSource TF)  45 mL Per Tube BID   free water  250 mL Per Tube Q4H   insulin aspart  0-15 Units Subcutaneous Q4H   levalbuterol  1.25 mg Nebulization QID   mouth rinse  15 mL Mouth Rinse 10 times per day   methylPREDNISolone (SOLU-MEDROL) injection  40 mg Intravenous Q12H   mupirocin ointment   Nasal BID   pantoprazole (PROTONIX) IV  40 mg Intravenous Q24H   sodium chloride flush  10-40 mL Intracatheter Q12H   Continuous Infusions:  amiodarone 30 mg/hr (08/12/21 1052)   ceFEPime (MAXIPIME) IV Stopped (08/12/21 1006)   feeding supplement (VITAL AF 1.2 CAL) 50 mL/hr at 08/11/21 2310   midazolam 2.5  mg/hr (08/12/21 1052)   PRN Meds:.acetaminophen **OR** acetaminophen, bisacodyl, ondansetron **OR** ondansetron (ZOFRAN) IV, sodium chloride flush    Interim History / Subjective:  Still requiring high peep/ fio2   Objective   Blood pressure 105/60, pulse 90, temperature 98.2 F (36.8 C), temperature source Axillary, resp. rate 19, height _0  (1.727 m), weight 94.3 kg, SpO2 94 %. CVP:  [0 mmHg-33 mmHg] 9 mmHg  Vent Mode: PRVC FiO2 (%):  [60 %-75 %] 70 % Set Rate:  [24 bmp] 24 bmp Vt Set:  [480 mL] 480 mL PEEP:  [5 cmH20-10 cmH20] 10 cmH20 Plateau Pressure:  [16 cmH20-18 cmH20] 18 cmH20   Intake/Output Summary (Last 24 hours) at 08/12/2021 1158 Last data filed at 08/12/2021 1052 Gross per 24 hour  Intake 1285.94 ml  Output 1425 ml  Net -139.06 ml   Filed Weights   08/10/21 0315 08/11/21 0400 08/12/21 0401   Weight: 92 kg 91.7 kg 94.3 kg   CVP:  [0 mmHg-33 mmHg] 9 mmHg   Examination: Tmax    98.7  General appearance:    sedated on vent but breathing above backup rate  No jvd Oropharynx et/og  Neck supple Lungs with a few scattered exp > insp rhonchi bilaterally RRR no s3 or or sign murmur Abd obese with limited excursion  Extr warm with no edema or clubbing noted Neuro  Sensorium sedated ,  no apparent motor deficits    I personally reviewed images and agree with radiology impression as follows:  CXR:   portable 12/15 Improved aeration of the lung bases.       Assessment & Plan:  1) Acute resp distress in pt with Influenza A infection/? Pna and component of acute on chronic chf with diastolic dysfunction likely esp in RAF  >>>continue  Vt  7 cc /kg/ adjust PEEP per ARDS protocol   >>> keep on dry side if bun/ creat/uop allow  2) Prob underlying asthma in this smoker but no evidence of COPD >>> continue xopenex/ added mucomyst noon 12/14   3 ) CAF better controlled > controlled Amiodarone drip since 12/12 >>> rx per cards  4)  hypotension resolved, off neo since am 12/14  but worse azotemia 12/15  >>> check cvp again, need to back off diuresis   5) AKI Lab Results  Component Value Date   CREATININE 1.32 (H) 08/12/2021   CREATININE 0.97 08/11/2021   CREATININE 0.92 08/10/2021   >>> May need albumin to build back intrasvasc volume though limited evidence that this is effective so defer to [facility]/cards       Best Practice (right click and "Reselect all SmartList Selections" daily)   Diet/type: NPO DVT prophylaxis: LMWH GI prophylaxis: PPI Lines: N/A Foley:  Yes, and it is still needed Code Status:  full code Last date of multidisciplinary goals of care discussion  per [facility]  - updated fm at bedside 12/15 no need for fob but still likely will need transfer to [facility] before weekend if not making more progress toward weaning   Labs   CBC: Recent Labs  Lab  08/09/21 0429 08/09/21 1623 08/10/21 0350 08/11/21 0356 08/12/21 0510  WBC 13.4* 16.1* 15.2* 17.4* 21.6*  NEUTROABS  --  13.5*  --   --   --   HGB 13.9 14.3 12.6* 12.1* 12.0*  HCT 42.3 43.3 37.9* 37.0* 37.5*  MCV 86.5 86.6 86.1 87.5 88.4  PLT 190 195 197 182 032    Basic Metabolic Panel: Recent Labs  Lab  08/07/21 0556 08/08/21 0423 08/09/21 0429 08/09/21 1623 08/10/21 0350 08/11/21 0356 08/12/21 0510  NA 134*   < > 131* 131* 131* 134* 137  K 5.0   < > 4.6 5.0 3.9 4.2 4.4  CL 102   < > 100 98 99 100 103  CO2 26   < > _0 GLUCOSE 194*   < > 149* 223* 239* 208* 286*  BUN 24*   < > 28* 28* 29* 40* 62*  CREATININE 1.08   < > 0.96 1.06 0.92 0.97 1.32*  CALCIUM 8.6*   < > 8.3* 8.1* 7.9* 7.9* 8.1*  MG 2.3  --   --   --  2.4 2.4  --   PHOS  --   --   --   --  3.1 3.8  --    < > = values in this interval not displayed.   GFR: Estimated Creatinine Clearance: 62.2 mL/min (A) (by C-G formula based on SCr of 1.32 mg/dL (H)). Recent Labs  Lab 08/09/21 1623 08/09/21 1833 08/10/21 0350 08/10/21 0753 08/11/21 0356 08/12/21 0510  PROCALCITON <0.10  --   --   --   --   --   WBC 16.1*  --  15.2*  --  17.4* 21.6*  LATICACIDVEN 2.9* 2.4*  --  1.4  --   --     Liver Function Tests: Recent Labs  Lab 08/06/21 1908 08/07/21 0556 08/09/21 1623 08/11/21 0356  AST 36 _1 ALT 53* 50* 47* 31  ALKPHOS 96 96 101 75  BILITOT 0.7 1.0 2.1* 0.7  PROT 6.8 6.9 7.0 5.7*  ALBUMIN 3.5 3.4* 3.3* 2.5*   No results for input(s): LIPASE, AMYLASE in the last 168 hours. No results for input(s): AMMONIA in the last 168 hours.  ABG    Component Value Date/Time   PHART 7.353 08/11/2021 1412   PCO2ART 48.0 08/11/2021 1412   PO2ART 78.9 (L) 08/11/2021 1412   HCO3 24.7 08/11/2021 1412   TCO2 25 10/18/2016 1729   ACIDBASEDEF 1.4 08/09/2021 1738   O2SAT 93.7 08/11/2021 1412     Coagulation Profile: Recent Labs  Lab 08/06/21 1908 08/09/21 1722  INR 1.0 1.5*    Cardiac  Enzymes: No results for input(s): CKTOTAL, CKMB, CKMBINDEX, TROPONINI in the last 168 hours.  HbA1C: Hgb A1c MFr Bld  Date/Time Value Ref Range Status  08/07/2021 05:56 AM 6.9 (H) 4.8 - 5.6 % Final    Comment:    (NOTE) Pre diabetes:          5.7%-6.4%  Diabetes:              >6.4%  Glycemic control for   <7.0% adults with diabetes   02/10/2021 08:53 AM 6.8 (H) 4.6 - 6.5 % Final    Comment:    Glycemic Control Guidelines for People with Diabetes:Non Diabetic:  <6%Goal of Therapy: <7%Additional Action Suggested:  >8%     CBG: Recent Labs  Lab 08/11/21 1554 08/11/21 1931 08/11/21 2308 08/12/21 0358 08/12/21 0749  GLUCAP 211* 237* 242* 280* 240*          The patient is critically ill with multiple organ systems failure and requires high complexity decision making for assessment and support, frequent evaluation and titration of therapies, application of advanced monitoring technologies and extensive interpretation of multiple databases. Critical Care Time devoted to patient care services described in this note is 40 minutes.   Christinia Gully, MD Pulmonary and  [facility] [phone]   After 7:00 pm call Elink  [phone]

## 2021-08-13 DIAGNOSIS — I482 Chronic atrial fibrillation, unspecified: Secondary | ICD-10-CM

## 2021-08-13 DIAGNOSIS — R6521 Severe sepsis with septic shock: Secondary | ICD-10-CM | POA: Diagnosis not present

## 2021-08-13 DIAGNOSIS — A419 Sepsis, unspecified organism: Secondary | ICD-10-CM | POA: Diagnosis not present

## 2021-08-13 DIAGNOSIS — Z978 Presence of other specified devices: Secondary | ICD-10-CM | POA: Diagnosis not present

## 2021-08-13 DIAGNOSIS — I25119 Atherosclerotic heart disease of native coronary artery with unspecified angina pectoris: Secondary | ICD-10-CM | POA: Diagnosis not present

## 2021-08-13 DIAGNOSIS — I5033 Acute on chronic diastolic (congestive) heart failure: Secondary | ICD-10-CM | POA: Diagnosis not present

## 2021-08-13 DIAGNOSIS — Z20822 Contact with and (suspected) exposure to covid-19: Secondary | ICD-10-CM | POA: Diagnosis not present

## 2021-08-13 DIAGNOSIS — I4891 Unspecified atrial fibrillation: Secondary | ICD-10-CM | POA: Diagnosis not present

## 2021-08-13 DIAGNOSIS — J1008 Influenza due to other identified influenza virus with other specified pneumonia: Secondary | ICD-10-CM | POA: Diagnosis not present

## 2021-08-13 DIAGNOSIS — Z23 Encounter for immunization: Secondary | ICD-10-CM | POA: Diagnosis not present

## 2021-08-13 DIAGNOSIS — J9601 Acute respiratory failure with hypoxia: Secondary | ICD-10-CM | POA: Diagnosis not present

## 2021-08-13 DIAGNOSIS — E870 Hyperosmolality and hypernatremia: Secondary | ICD-10-CM | POA: Diagnosis not present

## 2021-08-13 DIAGNOSIS — J159 Unspecified bacterial pneumonia: Secondary | ICD-10-CM | POA: Diagnosis not present

## 2021-08-13 LAB — TRIGLYCERIDES: Triglycerides: 118 mg/dL (ref <150)

## 2021-08-13 LAB — GLUCOSE, CAPILLARY
Glucose-Capillary: 207 mg/dL — ABNORMAL HIGH (ref 70–99)
Glucose-Capillary: 240 mg/dL — ABNORMAL HIGH (ref 70–99)
Glucose-Capillary: 258 mg/dL — ABNORMAL HIGH (ref 70–99)
Glucose-Capillary: 260 mg/dL — ABNORMAL HIGH (ref 70–99)
Glucose-Capillary: 277 mg/dL — ABNORMAL HIGH (ref 70–99)
Glucose-Capillary: 280 mg/dL — ABNORMAL HIGH (ref 70–99)

## 2021-08-13 MED ORDER — INSULIN DETEMIR 100 UNIT/ML ~~LOC~~ SOLN
15.0000 [IU] | Freq: Two times a day (BID) | SUBCUTANEOUS | Status: DC
  Administered 2021-08-13: 15 [IU] via SUBCUTANEOUS
  Filled 2021-08-13 (×2): qty 0.15

## 2021-08-13 MED ORDER — SODIUM CHLORIDE 0.9 % IV SOLN: INTRAVENOUS | Status: DC | PRN

## 2021-08-13 NOTE — Progress Notes (Signed)
PROGRESS NOTE    Patient: Shaun [name]                            PCP: Maximiano Coss, NP                    DOB: 1956/05/20            DOA: 08/06/2021 [id number]             DOS: 08/13/2021, 3:11 PM   LOS: 7 days   Date of Service: The patient was seen and examined on 08/13/2021  Subjective:   The patient was seen and examined this morning, still intubated on the vent, on amiodarone drip... Tachycardic,   Note: 08/13/21--remains intubated and sedated, vent settings adjusted (F102 down to 45 % from 75% discussed with pulmonologist, wife and daughter KIm as well as grand daughter Abby at bedside, -Weaned off pressors -Remains on IV amiodarone--tachycardic still  Did ok with sedation vacation  -Patient's wife, daughter and grand-daughter aware of plan to transfer to [facility] ICU   Brief Narrative:   Shaun [name]  is a 65 y.o. male, with history of anxiety, ASCVD, CHF, COPD, diabetes mellitus type 2, hyperlipidemia, hypertension, obstructive sleep apnea, paroxysmal atrial fibrillation, and more presents ED with chief complaint of dyspnea.  Patient reports he has had fever and dyspnea for 1 week.  He reports that he was diagnosed with the flu 6 days ago.  He was given a steroid and Tessalon Perles per his report.  He reports that it was not helpful.  He had a T-max at home of 102.  He tried Tylenol and thinks that he had temporary relief.  He has a cough but is only occasionally productive with green sputum.   He does not wear oxygen at home.  He does sleep with a CPAP at home.  Pa   Patient smokes half a pack a day but declines a nicotine patch as he has not had a cigarette since he has been ill.   Patient is vaccinated for COVID but not flu.  Patient is full code.  ED Temp 99.7, heart rate 101-125, respiratory rate 16-25, blood pressure 125/88, satting 96% on 3 L nasal cannula No leukocytosis with white blood cell count 10.3, hemoglobin 15 Chemistry panel reveals a  decreased bicarb at 21 and an AKI with a creatinine of 1.43 Patient is fluid positive Chest x-ray shows no active disease    Assessment & Plan:   Principal Problem:   Acute respiratory failure with hypoxemia (HCC) Active Problems:   Chronic atrial fibrillation (HCC)   Tobacco abuse   Obstructive sleep apnea   Essential hypertension   Non-insulin treated type 2 diabetes mellitus (HCC)   COPD (chronic obstructive pulmonary disease) (HCC)   Chronic diastolic CHF (congestive heart failure) (HCC)   Influenza   Endotracheally intubated  Principal Problem:    Acute respiratory failure with hypoxia (HCC)-due to influenza A  -08/09/2021-worsening respiratory failure, hypoxia  - 12/12 -status post intubation -on the vent -12/12 -left IJ placed 08/12/21--remains intubated and sedated, vent settings adjusted, discussed with pulmonologist, , -Weaned off pressors -Remains on IV amiodarone -On vent PRVC  45%/24/10/480 -Subsequent ABGs chest x-rays reviewed -Continue  iv Solu-Medrol 40 mg twice daily - DuoNeb bronchodilators, mucolytic, -Continue Tamiflu PCCM Dr. Melvyn Novas consulted appreciate his close follow-up -08/13/21--remains intubated and sedated, vent settings adjusted (F102 down to 45 % from 75% discussed with  pulmonologist, wife and daughter KIm as well as grand daughter Abby at bedside, -Weaned off pressors -Remains on IV amiodarone--tachycardic still Did ok with sedation vacation   Sepsis due to bacterial pneumonia superimposed on Influenza -Met Sepsis criteria-due to respiratory failure, fever, leukocytosis, tachycardic, tachypneic with lactic acidosis - -Status post IV fluid resuscitation, treated with  Tamiflu -Continue IV cefepime which was initiated 08/09/2021--may be de-escalated as patient status improves -Blood cultures obtained 08/09/2021-NGTD -Urinalysis negative for any leukocyte Estrace WBC 0-5   A. fib with RVR -Cardiologist Dr. Domenic Polite consulted, following  closely, Per recommendation patient remains on amiodarone drip -Lovenox therapeutic dose initiated (Eliquis on hold while intubated)  Acute renal insufficiency -Baseline creatinine around 0.89, Cr. On admission>> 1.43 >>> 1.08 >> 0.93, 0.92 -Holding ACE inhibitor's and Lasix - renally adjust medications, avoid nephrotoxic agents / dehydration  / hypotension    Active Problems:  Chronic atrial fibrillation (HCC) -home medication Lopressor and Eliquis -Continue amiodarone for rate control and subcu therapeutic Lovenox for stroke prophylaxis  Tobacco abuse - would benefit from smoking cessation  Obstructive sleep apnea -currently intubated on the vent    Essential hypertension -holding home BP meds    Non-insulin treated type 2 diabetes mellitus (HCC) -Use Novolog/Humalog Sliding scale insulin with Accu-Cheks/Fingersticks as ordered     COPD (chronic obstructive pulmonary disease) ([place]) -management as above--intubated   Chronic diastolic CHF (congestive heart failure) (HCC) -monitoring daily weights, I's and O's, holding Lasix   CRITICAL CARE Performed by: Roxan Hockey   Total critical care time: 42 minutes  Critical care time was exclusive of separately billable procedures and treating other patients.  Critical care was necessary to treat or prevent imminent or life-threatening deterioration. -  08/13/21--remains intubated and sedated, vent settings adjusted, discussed with pulmonologist, wife at bedside, -Weaned off pressors -Remains on IV amiodarone --Patient's wife,  daughter and grand-daughter aware of plan to transfer to [facility] ICU when bed becomes available -Overall improving, did okay with sedation vacation -FiO2 reduced to 45% from 75% --On vent PRVC  45%/24/10/480  Critical care was time spent personally by me on the following activities: development of treatment plan with patient and/or surrogate as well as nursing, discussions with consultants,  evaluation of patient's response to treatment, examination of patient, obtaining history from patient or surrogate, ordering and performing treatments and interventions, ordering and review of laboratory studies, ordering and review of radiographic studies, pulse oximetry and re-evaluation of patient's condition.   ------------------------------------------------------------------------------------------------------------------------------------------ Cultures; Blood Cultures x 2 >> NGTD Sputum cultures >>   Antimicrobials: Tamiflu Cefepime 08/09/2021  Consultants: PCCM--Dr Melvyn Novas and Cardiology Dr Domenic Polite  -------------------------------------------------------------------------------------------------------------------------------------  DVT prophylaxis:  SCD/Compression stockings and UD:JSHFWYO Code Status:   Code Status: Full Code  Family Communication: Discussed with   wife at bedside   Admission status:   Status is: Inpatient  Remains inpatient appropriate because: In acute respiratory failure, meeting sepsis criteria, intubated, on vent, hypotensive, A. fib with RVR... Requiring vent management, blood pressure management, heart rate management, treating underlying infection..   Level of care: ICU --Patient's wife and daughter aware of plan to transfer to [facility] ICU when bed becomes available  Procedures:   No admission procedures for hospital encounter.    Antimicrobials:  Anti-infectives (From admission, onward)    Start     Dose/Rate Route Frequency Ordered Stop   08/10/21 0015  oseltamivir (TAMIFLU) capsule 75 mg        75 mg Per Tube 2 times daily 08/09/21 2316 08/11/21 0944  08/09/21 1745  ceFEPIme (MAXIPIME) 2 g in sodium chloride 0.9 % 100 mL IVPB  Status:  Discontinued        2 g 200 mL/hr over 30 Minutes Intravenous  Once 08/09/21 1657 08/09/21 1702   08/09/21 1730  ceFEPIme (MAXIPIME) 2 g in sodium chloride 0.9 % 100 mL IVPB        2 g 200 mL/hr  over 30 Minutes Intravenous Every 8 hours 08/09/21 1702     08/07/21 0800  oseltamivir (TAMIFLU) capsule 75 mg  Status:  Discontinued        75 mg Oral 2 times daily 08/07/21 0711 08/09/21 2316        Medication:   ARIPiprazole  2 mg Per Tube Daily   atorvastatin  80 mg Per Tube Daily   chlorhexidine gluconate (MEDLINE KIT)  15 mL Mouth Rinse BID   Chlorhexidine Gluconate Cloth  6 each Topical Daily   docusate  100 mg Per Tube BID   enoxaparin (LOVENOX) injection  95 mg Subcutaneous Q12H   escitalopram  20 mg Per Tube Daily   feeding supplement (PROSource TF)  45 mL Per Tube BID   free water  250 mL Per Tube Q4H   insulin aspart  0-15 Units Subcutaneous Q4H   levalbuterol  1.25 mg Nebulization QID   mouth rinse  15 mL Mouth Rinse 10 times per day   methylPREDNISolone (SOLU-MEDROL) injection  40 mg Intravenous Q12H   mupirocin ointment   Nasal BID   pantoprazole (PROTONIX) IV  40 mg Intravenous Q24H   sodium chloride flush  10-40 mL Intracatheter Q12H    acetaminophen **OR** acetaminophen, bisacodyl, ondansetron **OR** ondansetron (ZOFRAN) IV, sodium chloride flush   Objective:   Vitals:   08/13/21 1345 08/13/21 1400 08/13/21 1415 08/13/21 1430  BP: (!) 126/57 (!) 122/54 121/62 (!) 115/53  Pulse: (!) 101 85 93 96  Resp: (!) 23 (!) 24 (!) 24 (!) 26  Temp:      TempSrc:      SpO2: 94% 93% 92% 91%  Weight:      Height:        Intake/Output Summary (Last 24 hours) at 08/13/2021 1511 Last data filed at 08/13/2021 1003 Gross per 24 hour  Intake 1119.05 ml  Output 950 ml  Net 169.05 ml   Filed Weights   08/11/21 0400 08/12/21 0401 08/13/21 0409  Weight: 91.7 kg 94.3 kg 94.9 kg     Examination:    Physical Exam:  Physical Exam  Gen:-Intubated, sedated, wakes up okay with sedation vacation HEENT:-ET and OG tube Neck-left IJ central line Lungs-diminished breath sounds, no significant wheezing CV- S1, S2 normal, irregularly irregular and tachycardic abd-  +ve  B.Sounds, Abd Soft,ND   Extremity/Skin:- No  edema,   good pedal pulses  Neuropsych-intubated and sedated, able to follow commands with sedation vacation GU-Foley in situ    Lt IJ central Line Foley     -----------------------------------------------------------------------------------------------------------------------------    LABs:  CBC Latest Ref Rng & Units 08/12/2021 08/11/2021 08/10/2021  WBC 4.0 - 10.5 K/uL 21.6(H) 17.4(H) 15.2(H)  Hemoglobin 13.0 - 17.0 g/dL 12.0(L) 12.1(L) 12.6(L)  Hematocrit 39.0 - 52.0 % 37.5(L) 37.0(L) 37.9(L)  Platelets 150 - 400 K/uL 167 182 197   CMP Latest Ref Rng & Units 08/12/2021 08/11/2021 08/10/2021  Glucose 70 - 99 mg/dL 286(H) 208(H) 239(H)  BUN 8 - 23 mg/dL 62(H) 40(H) 29(H)  Creatinine 0.61 - 1.24 mg/dL 1.32(H) 0.97 0.92  Sodium 135 - 145 mmol/L  137 134(L) 131(L)  Potassium 3.5 - 5.1 mmol/L 4.4 4.2 3.9  Chloride 98 - 111 mmol/L 103 100 99  CO2 22 - 32 mmol/L _0 Calcium 8.9 - 10.3 mg/dL 8.1(L) 7.9(L) 7.9(L)  Total Protein 6.5 - 8.1 g/dL - 5.7(L) -  Total Bilirubin 0.3 - 1.2 mg/dL - 0.7 -  Alkaline Phos 38 - 126 U/L - 75 -  AST 15 - 41 U/L - 15 -  ALT 0 - 44 U/L - 31 -    Micro Results Recent Results (from the past 240 hour(s))  Resp Panel by RT-PCR (Flu A&B, Covid) Nasopharyngeal Swab     Status: Abnormal   Collection Time: 08/06/21  7:28 PM   Specimen: Nasopharyngeal Swab; Nasopharyngeal(NP) swabs in vial transport medium  Result Value Ref Range Status   SARS Coronavirus 2 by RT PCR NEGATIVE NEGATIVE Final    Comment: (NOTE) SARS-CoV-2 target nucleic acids are NOT DETECTED.  The SARS-CoV-2 RNA is generally detectable in upper respiratory specimens during the acute phase of infection. The lowest concentration of SARS-CoV-2 viral copies this assay can detect is 138 copies/mL. A negative result does not preclude SARS-Cov-2 infection and should not be used as the sole basis for treatment or other patient management  decisions. A negative result may occur with  improper specimen collection/handling, submission of specimen other than nasopharyngeal swab, presence of viral mutation(s) within the areas targeted by this assay, and inadequate number of viral copies(<138 copies/mL). A negative result must be combined with clinical observations, patient history, and epidemiological information. The expected result is Negative.  Fact Sheet for Patients:  EntrepreneurPulse.com.au  Fact Sheet for Healthcare Providers:  IncredibleEmployment.be  This test is no t yet approved or cleared by the [place] FDA and  has been authorized for detection and/or diagnosis of SARS-CoV-2 by FDA under an Emergency Use Authorization (EUA). This EUA will remain  in effect (meaning this test can be used) for the duration of the COVID-19 declaration under Section 564(b)(1) of the Act, 21 U.S.C.section 360bbb-3(b)(1), unless the authorization is terminated  or revoked sooner.       Influenza A by PCR POSITIVE (A) NEGATIVE Final   Influenza B by PCR NEGATIVE NEGATIVE Final    Comment: (NOTE) The Xpert Xpress SARS-CoV-2/FLU/RSV plus assay is intended as an aid in the diagnosis of influenza from Nasopharyngeal swab specimens and should not be used as a sole basis for treatment. Nasal washings and aspirates are unacceptable for Xpert Xpress SARS-CoV-2/FLU/RSV testing.  Fact Sheet for Patients: EntrepreneurPulse.com.au  Fact Sheet for Healthcare Providers: IncredibleEmployment.be  This test is not yet approved or cleared by the [place] FDA and has been authorized for detection and/or diagnosis of SARS-CoV-2 by FDA under an Emergency Use Authorization (EUA). This EUA will remain in effect (meaning this test can be used) for the duration of the COVID-19 declaration under Section 564(b)(1) of the Act, 21 U.S.C. section 360bbb-3(b)(1), unless  the authorization is terminated or revoked.  Performed at [facility], [address] [place], [place] [address]   MRSA Next Gen by PCR, Nasal     Status: Abnormal   Collection Time: 08/09/21  3:03 PM   Specimen: Nasal Mucosa; Nasal Swab  Result Value Ref Range Status   MRSA by PCR Next Gen DETECTED (A) NOT DETECTED Final    Comment: RESULT CALLED TO, READ BACK BY AND VERIFIED WITH: KARA, RN @ [phone] ON 08/10/21 C VARNER (NOTE) The GeneXpert MRSA Assay (FDA approved for  NASAL specimens only), is one component of a comprehensive MRSA colonization surveillance program. It is not intended to diagnose MRSA infection nor to guide or monitor treatment for MRSA infections. Test performance is not FDA approved in patients less than 20 years old. Performed at [facility], [address] [place], [place] [address]   Culture, blood (routine x 2)     Status: None (Preliminary result)   Collection Time: 08/09/21  5:22 PM   Specimen: BLOOD  Result Value Ref Range Status   Specimen Description BLOOD  Final   Special Requests NONE  Final   Culture   Final    NO GROWTH 4 DAYS Performed at [facility], [address] [place], [place] [address]    Report Status PENDING  Incomplete  Culture, blood (routine x 2)     Status: None (Preliminary result)   Collection Time: 08/09/21  5:22 PM   Specimen: BLOOD  Result Value Ref Range Status   Specimen Description BLOOD  Final   Special Requests NONE  Final   Culture   Final    NO GROWTH 4 DAYS Performed at [facility], [address] [place], [place] [address]    Report Status PENDING  Incomplete  Expectorated Sputum Assessment w Gram Stain, Rflx to Resp Cult     Status: None   Collection Time: 08/10/21 11:10 AM   Specimen: Expectorated Sputum  Result Value Ref Range Status   Specimen Description EXPECTORATED SPUTUM  Final   Special Requests NONE  Final   Sputum evaluation   Final    THIS SPECIMEN IS ACCEPTABLE FOR SPUTUM CULTURE Performed at  [facility], [address], [place], [place] [address]    Report Status 08/10/2021 FINAL  Final  Culture, Respiratory w Gram Stain     Status: None   Collection Time: 08/10/21 11:10 AM  Result Value Ref Range Status   Specimen Description   Final    EXPECTORATED SPUTUM Performed at [facility], [address], [place], [place] [address]    Special Requests   Final    NONE Reflexed from [id number] Performed at [facility], [address] [place], [place] [address]    Gram Stain   Final    ABUNDANT WBC PRESENT,BOTH PMN AND MONONUCLEAR RARE GRAM POSITIVE COCCI RARE GRAM NEGATIVE RODS    Culture   Final    FEW Normal respiratory flora-no Staph aureus or Pseudomonas seen Performed at [facility], [place] [address], [place], [place] [address]    Report Status 08/12/2021 FINAL  Final    Radiology Reports DG Chest Port 1 View  Result Date: 08/12/2021 CLINICAL DATA:  Acute respiratory failure with hypoxemia. EXAM: PORTABLE CHEST 1 VIEW COMPARISON:  08/10/2021 FINDINGS: An endotracheal tube remains in place terminating proximally 5 cm above the carina. An enteric tube courses into the abdomen with tip not imaged. A left jugular catheter terminates over the upper SVC, unchanged. The patient is rotated to the right with persistent mild enlargement of the cardiac silhouette. Lung volumes remain low with mild central pulmonary vascular congestion. Patchy airspace opacities are present in the left greater than right lung bases with improved aeration of the lung bases compared to the prior study, particularly on the left. No sizable pleural effusion or pneumothorax is identified. IMPRESSION: Improved aeration of the lung bases. Electronically Signed   By: Logan Bores M.D.   On: 08/12/2021 09:06   DG CHEST PORT 1 VIEW  Result Date: 08/11/2021 CLINICAL DATA:  Hypoxia and respiratory failure. EXAM: PORTABLE CHEST 1 VIEW COMPARISON:  Portable chest earlier today at 6:25 a.m. FINDINGS: 08/10/2021 at  11:44 p.m. the heart is enlarged. Central vessels are normal caliber. Emphysematous and chronic interstitial changes are present. Again noted is a small infiltrate in the right upper lobe apex, and hazy atelectasis or pneumonitis in the right base. On the current exam there is increasingly dense left lower lobe consolidation, and small increased left pleural effusion. No other new opacity is seen. ETT tip is 5 cm from the carina, NGT tip in the body of stomach based on the positioning of the proximal side hole with the tip not included, left IJ central line tip in the SVC. IMPRESSION: 1. Increasingly dense left lower lobe consolidation and small left pleural effusion. 2. Unchanged small infiltrate in the right apex, with hazy right basilar atelectasis or pneumonitis. 3. Cardiomegaly without findings of CHF. Electronically Signed   By: Telford Nab M.D.   On: 08/11/2021 00:17   DG CHEST PORT 1 VIEW  Result Date: 08/10/2021 CLINICAL DATA:  Difficulty breathing EXAM: PORTABLE CHEST 1 VIEW COMPARISON:  Previous studies including the examination is done on 08/09/2021 FINDINGS: Transverse diameter of heart is increased. Central pulmonary vessels are slightly more prominent. Interstitial markings in the left parahilar region and lower lung fields appear more prominent. There is no focal consolidation. There is no significant pleural effusion or pneumothorax. Tip of endotracheal is 5.1 cm above the carina. Tip of central venous catheter is seen in the superior vena cava. Enteric tube is noted traversing the esophagus. Distal portion of the enteric tube is seen in the stomach. Tip is not included in the radiograph. IMPRESSION: Cardiomegaly. Interstitial markings in the parahilar regions and lower lung fields appear more prominent. This may suggest mild interstitial edema or interstitial pneumonitis or an artifact due to differences in the techniques. Electronically Signed   By: Elmer Picker M.D.   On:  08/10/2021 09:52   DG CHEST PORT 1 VIEW  Result Date: 08/09/2021 CLINICAL DATA:  Shortness of breath EXAM: PORTABLE CHEST 1 VIEW COMPARISON:  08/07/2021.  09/19/2018. FINDINGS: Chronic cardiomegaly and aortic atherosclerosis. Worsened venous hypertension, possibly with mild interstitial edema. No visible effusion. There could also be a bronchial thickening pattern consistent with bronchitis. IMPRESSION: Probable fluid overload/low level congestive heart failure. Cardiomegaly, venous hypertension and mild interstitial edema. Question also bronchial thickening that could go along with bronchitis. Electronically Signed   By: Nelson Chimes M.D.   On: 08/09/2021 13:17   Portable chest 1 View  Result Date: 08/07/2021 CLINICAL DATA:  65 year old male with respiratory failure.  Smoker. EXAM: PORTABLE CHEST 1 VIEW COMPARISON:  Portable chest 08/06/2021 and earlier. FINDINGS: Portable AP upright view at 0439 hours. Stable lung volumes and mediastinal contours. Borderline cardiomegaly. Stable lung markings. Allowing for portable technique the lungs are clear. No pneumothorax or pleural effusion. Visualized tracheal air column is within normal limits. No acute osseous abnormality identified. IMPRESSION: No acute cardiopulmonary abnormality. Electronically Signed   By: Genevie Ann M.D.   On: 08/07/2021 04:59   DG Chest Port 1 View  Result Date: 08/06/2021 CLINICAL DATA:  Shortness of breath EXAM: PORTABLE CHEST 1 VIEW COMPARISON:  09/21/2018 FINDINGS: Cardiomegaly. No confluent opacities, effusions or edema. No acute bony abnormality. IMPRESSION: Cardiomegaly.  No active disease. Electronically Signed   By: Rolm Baptise M.D.   On: 08/06/2021 19:45   DG Chest Port 1V same Day  Result Date: 08/09/2021 CLINICAL DATA:  Central  line placement. EXAM: PORTABLE CHEST 1 VIEW COMPARISON:  Chest radiograph dated 08/09/2021. FINDINGS: Endotracheal tube with tip approximately 4.5 cm above the carina. Enteric tube extends below  the diaphragm with tip in the left upper abdomen, likely in the proximal stomach. Left IJ central venous line with tip over central SVC. Mild cardiomegaly with mild vascular congestion. Left lung base density may represent atelectasis. Developing infiltrate is not excluded. No large pleural effusion. No pneumothorax. No acute osseous pathology. Degenerative changes of spine. IMPRESSION: 1. Endotracheal tube above the carina. 2. Left IJ central venous line with tip over central SVC. No pneumothorax. Mild cardiomegaly and vascular congestion. Electronically Signed   By: Anner Crete M.D.   On: 08/09/2021 19:59   DG Chest Port 1V same Day  Result Date: 08/09/2021 CLINICAL DATA:  65 year old male with intubation EXAM: PORTABLE CHEST 1 VIEW COMPARISON:  08/09/2021, 08/07/2021 FINDINGS: Cardiomediastinal silhouette unchanged in size and contour. No evidence of central vascular congestion. No interlobular septal thickening. Interval placement of endotracheal tube, terminating suitably above the carina, 3.9 cm. Interval placement of gastric tube projecting over the mediastinum and terminating within the left upper quadrant Low lung volumes with no confluent airspace disease. Coarsened interstitial markings similar to the recent comparison chest x-rays. No pneumothorax or pleural effusion. No acute displaced fracture. Similar appearance lytic changes at the distal right clavicle IMPRESSION: Interval placement of endotracheal tube, terminating suitably above the carina. Gastric tube terminates in the stomach. Low lung volumes with likely atelectasis at the bases Electronically Signed   By: Corrie Mckusick D.O.   On: 08/09/2021 16:28   ECHOCARDIOGRAM COMPLETE  Result Date: 08/10/2021    ECHOCARDIOGRAM REPORT   Patient Name:   SHIVA SAHAGIAN Date of Exam: 08/10/2021 Medical Rec #:  [id number]          Height:       68.0 in Accession #:    [id number]         Weight:       202.8 lb Date of Birth:  1956/02/09          BSA:          2.056 m Patient Age:    65 years           BP:           106/68 mmHg Patient Gender: M                  HR:           97 bpm. Exam Location:  [facility] Procedure: 2D Echo, Cardiac Doppler and Color Doppler Indications:    Atrial Fibrillation I48.91  History:        Patient has prior history of Echocardiogram examinations, most                 recent 04/08/2019. CHF, CAD; Risk Factors:Hypertension and                 Dyslipidemia. Sleep Apnea.  Sonographer:    Alvino Chapel RCS Referring Phys: Teller Comments: Patient on mechanical ventilator at time of echo exam. IMPRESSIONS  1. Left ventricular ejection fraction, by estimation, is 60 to 65%. The left ventricle has normal function. The left ventricle has no regional wall motion abnormalities. There is moderate concentric left ventricular hypertrophy. Left ventricular diastolic parameters are indeterminate.  2. Right ventricular systolic function is normal. The right ventricular size is normal. Tricuspid regurgitation signal is inadequate  for assessing PA pressure.  3. Left atrial size was mildly dilated.  4. The mitral valve is grossly normal. Trivial mitral valve regurgitation.  5. The aortic valve is tricuspid. Aortic valve regurgitation is not visualized.  6. Unable to estimate CVP. Comparison(s): Prior images reviewed side by side. LVEF stable at 60-65%. FINDINGS  Left Ventricle: Left ventricular ejection fraction, by estimation, is 60 to 65%. The left ventricle has normal function. The left ventricle has no regional wall motion abnormalities. The left ventricular internal cavity size was normal in size. There is  moderate concentric left ventricular hypertrophy. Left ventricular diastolic function could not be evaluated due to atrial fibrillation. Left ventricular diastolic parameters are indeterminate. Right Ventricle: The right ventricular size is normal. No increase in right ventricular wall thickness. Right  ventricular systolic function is normal. Tricuspid regurgitation signal is inadequate for assessing PA pressure. Left Atrium: Left atrial size was mildly dilated. Right Atrium: Right atrial size was normal in size. Pericardium: There is no evidence of pericardial effusion. Presence of epicardial fat layer. Mitral Valve: The mitral valve is grossly normal. Trivial mitral valve regurgitation. Tricuspid Valve: The tricuspid valve is grossly normal. Tricuspid valve regurgitation is trivial. Aortic Valve: The aortic valve is tricuspid. There is mild to moderate aortic valve annular calcification. Aortic valve regurgitation is not visualized. Pulmonic Valve: The pulmonic valve was not well visualized. Pulmonic valve regurgitation is trivial. Aorta: The aortic root is normal in size and structure. Venous: Unable to estimate CVP. IVC assessment for right atrial pressure unable to be performed due to mechanical ventilation. IAS/Shunts: No atrial level shunt detected by color flow Doppler.  LEFT VENTRICLE PLAX 2D LVIDd:         4.60 cm LVIDs:         2.90 cm LV PW:         1.30 cm LV IVS:        1.40 cm LVOT diam:     1.90 cm LV SV:         46 LV SV Index:   22 LVOT Area:     2.84 cm  RIGHT VENTRICLE RV S prime:     10.70 cm/s TAPSE (M-mode): 1.8 cm LEFT ATRIUM             Index        RIGHT ATRIUM           Index LA diam:        4.10 cm 1.99 cm/m   RA Area:     18.50 cm LA Vol (A2C):   84.4 ml 41.05 ml/m  RA Volume:   52.00 ml  25.29 ml/m LA Vol (A4C):   75.7 ml 36.82 ml/m LA Biplane Vol: 79.8 ml 38.81 ml/m  AORTIC VALVE LVOT Vmax:   91.70 cm/s LVOT Vmean:  58.300 cm/s LVOT VTI:    0.161 m  AORTA Ao Root diam: 3.30 cm MITRAL VALVE MV Area (PHT): 4.15 cm    SHUNTS MV Decel Time: 183 msec    Systemic VTI:  0.16 m MV E velocity: 86.40 cm/s  Systemic Diam: 1.90 cm Rozann Lesches MD Electronically signed by Rozann Lesches MD Signature Date/Time: 08/10/2021/8:24:44 PM    Final     SIGNED: Roxan Hockey, MD,   [facility],  Pager (please use [email or web address] to page/text) Please use Epic Secure Chat for non-urgent communication (7AM-7PM)  If 7PM-7AM, please contact night-coverage [email or web address], 08/13/2021, 3:11 PM

## 2021-08-13 NOTE — Progress Notes (Signed)
° °Progress Note ° °Patient Name: Shaun [name] °Date of Encounter: 08/14/2021 ° °[facility] Cardiologist: Brian Crenshaw, MD  ° °Subjective  ° °Transferred to [facility] for acute hypoxic respiratory failure due to Influenza A. Cardiology following for Afib. ° °Remains intubated and sedated currently. On 50% FiO2. HR controlled on amiodarone gtt.  ° °Inpatient Medications  °  °Scheduled Meds: ° ARIPiprazole  2 mg Per Tube Daily  ° atorvastatin  80 mg Per Tube Daily  ° chlorhexidine gluconate (MEDLINE KIT)  15 mL Mouth Rinse BID  ° Chlorhexidine Gluconate Cloth  6 each Topical Daily  ° docusate  100 mg Per Tube BID  ° enoxaparin (LOVENOX) injection  95 mg Subcutaneous Q12H  ° escitalopram  20 mg Per Tube Daily  ° feeding supplement (PROSource TF)  45 mL Per Tube BID  ° free water  250 mL Per Tube Q4H  ° insulin aspart  0-20 Units Subcutaneous Q4H  ° insulin detemir  20 Units Subcutaneous BID  ° levalbuterol  1.25 mg Nebulization QID  ° mouth rinse  15 mL Mouth Rinse 10 times per day  ° methylPREDNISolone (SOLU-MEDROL) injection  40 mg Intravenous Q12H  ° mupirocin ointment   Nasal BID  ° pantoprazole (PROTONIX) IV  40 mg Intravenous Q24H  ° sodium chloride flush  10-40 mL Intracatheter Q12H  ° °Continuous Infusions: ° sodium chloride 10 mL/hr at 08/13/21 1848  ° amiodarone 30 mg/hr (08/14/21 0813)  ° ceFEPime (MAXIPIME) IV 2 g (08/14/21 0911)  ° feeding supplement (VITAL AF 1.2 CAL) 1,000 mL (08/13/21 2000)  ° fentaNYL infusion INTRAVENOUS 75 mcg/hr (08/14/21 0813)  ° midazolam 3 mg/hr (08/14/21 0813)  ° °PRN Meds: °sodium chloride, acetaminophen **OR** acetaminophen, bisacodyl, ondansetron **OR** ondansetron (ZOFRAN) IV, sodium chloride flush  ° °Vital Signs  °  °Vitals:  ° 08/14/21 0732 08/14/21 0800 08/14/21 0807 08/14/21 0900  °BP:  110/65  123/71  °Pulse:  98  97  °Resp:  (!) 21  18  °Temp:   98.4 °F (36.9 °C)   °TempSrc:   Axillary   °SpO2: 95% 94%  94%  °Weight:      °Height:      ° ° °Intake/Output Summary  (Last 24 hours) at 08/14/2021 0932 °Last data filed at 08/14/2021 0813 °Gross per 24 hour  °Intake 4588.16 ml  °Output 2485 ml  °Net 2103.16 ml  ° °Last 3 Weights 08/14/2021 08/13/2021 08/13/2021  °Weight (lbs) 209 lb 7 oz 210 lb 5.1 oz 209 lb 3.5 oz  °Weight (kg) 95 kg 95.4 kg 94.9 kg  °   ° °Telemetry  °  °Afib with rates overall controlled 80-90s - Personally Reviewed ° °ECG  °  °No new tracing - Personally Reviewed ° °Physical Exam  ° °GEN: Intubated, sedated  °Neck: No JVD °Cardiac: Irregularly irregular, no murmurs °Respiratory: Coarse vent sounds °GI: Soft, nontender, non-distended  °MS: No edema; No deformity. °Neuro:  Nonfocal  °Psych: Normal affect  ° °Labs  °  °High Sensitivity Troponin:   °Recent Labs  °Lab 08/06/21 °2140 08/06/21 °2348 08/10/21 °0350  °TROPONINIHS 4 4 4  °   °Chemistry °Recent Labs  °Lab 08/09/21 °1623 08/10/21 °0350 08/11/21 °0356 08/12/21 °0510 08/14/21 °0258  °NA 131* 131* 134* 137 137  °K 5.0 3.9 4.2 4.4 5.5*  °CL 98 99 100 103 106  °CO2 24 24 25 25 28  °GLUCOSE 223* 239* 208* 286* 318*  °BUN 28* 29* 40* 62* 57*  °CREATININE 1.06 0.92 0.97 1.32*   1.08  °CALCIUM 8.1* 7.9* 7.9* 8.1* 8.0*  °MG  --  2.4 2.4  --  2.7*  °PROT 7.0  --  5.7*  --   --   °ALBUMIN 3.3*  --  2.5*  --   --   °AST 28  --  15  --   --   °ALT 47*  --  31  --   --   °ALKPHOS 101  --  75  --   --   °BILITOT 2.1*  --  0.7  --   --   °GFRNONAA >60 >60 >60 60* >60  °ANIONGAP 9 8 9 9 3*  °  °Lipids  °Recent Labs  °Lab 08/13/21 °0418  °TRIG 118  °  °Hematology °Recent Labs  °Lab 08/11/21 °0356 08/12/21 °0510 08/14/21 °0258  °WBC 17.4* 21.6* 15.7*  °RBC 4.23 4.24 3.93*  °HGB 12.1* 12.0* 10.9*  °HCT 37.0* 37.5* 34.6*  °MCV 87.5 88.4 88.0  °MCH 28.6 28.3 27.7  °MCHC 32.7 32.0 31.5  °RDW 14.0 14.1 14.4  °PLT 182 167 146*  ° °Thyroid No results for input(s): TSH, FREET4 in the last 168 hours.  °BNP °Recent Labs  °Lab 08/09/21 °0429  °BNP 78.0  °  °DDimer No results for input(s): DDIMER in the last 168 hours.  ° °Radiology  °   °DG CHEST PORT 1 VIEW ° °Result Date: 08/14/2021 °CLINICAL DATA:  Short of breath.  Follow-up exam. EXAM: PORTABLE CHEST 1 VIEW COMPARISON:  08/12/2021 and older exams. FINDINGS: Interstitial and mild patchy airspace opacities at the lung bases are without significant change from the most recent prior exam. No new lung abnormalities. Endotracheal tube and nasal/orogastric tube are stable. Left internal jugular central venous line is stable. No pneumothorax. IMPRESSION: 1. No significant interval change in lung base opacities when compared to the most recent prior study, and when allowing for differences in patient positioning and technique. No new lung abnormalities. 2. Stable support apparatus. Electronically Signed   By: David  Ormond M.D.   On: 08/14/2021 09:04   ° °Cardiac Studies  ° °TTE 08/10/21 °IMPRESSIONS  ° 1. Left ventricular ejection fraction, by estimation, is 60 to 65%. The  °left ventricle has normal function. The left ventricle has no regional  °wall motion abnormalities. There is moderate concentric left ventricular  °hypertrophy. Left ventricular  °diastolic parameters are indeterminate.  ° 2. Right ventricular systolic function is normal. The right ventricular  °size is normal. Tricuspid regurgitation signal is inadequate for assessing  °PA pressure.  ° 3. Left atrial size was mildly dilated.  ° 4. The mitral valve is grossly normal. Trivial mitral valve  °regurgitation.  ° 5. The aortic valve is tricuspid. Aortic valve regurgitation is not  °visualized.  ° 6. Unable to estimate CVP.  ° °Comparison(s): Prior images reviewed side by side. LVEF stable at 60-65%.  ° °Patient Profile  °   °65 y.o. male CAD s/p DES to Lcx (2016), permanent Afib, COPD, DMII, HTN, HHLD, OSA who presented to [facility] with dyspnea found to have Influenza A. Course complicated by acute hypoxic respiratory failure requiring intubation and Afib with RVR for which Cardiology is consulted.  ° °Assessment & Plan  °  °#Acute  hypoxic respiratory failure: °#Influenza A: °Patient presented to [facility] with worsening dyspnea found to be hypoxic requiring intubation. Now transferred to [facility] for further management. °-Vent management per ICU °-Completed tamiflu x5 days ° °#Permanent Afib: °CHADs-vasc 4-5. Has had recent RVR in the   the setting of acute hypoxic respiratory failure with influenza A.  He continues on IV amiodarone for heart rate control, beta-blocker held at this point. Eliquis transitioned to Lovenox.  -Continue amiodarone gtt for now; will plan to transition to PO amio once extubated -On lovenox for [facility] -Will plan to transition back to apixban once able  #CAD s/p DES to Lcx in 2016: Patent at angiography in 2018 with mild to moderate residual CAD. EF normal 60-65% in 08/10/21. -Not on ASA due to need for [facility] -Continue lipitor 76m daily -Will add back BB/ARB once able      For questions or updates, please contact CIliffPlease consult [email or web address] for contact info under        Signed, HFreada Bergeron MD  08/14/2021, 9:32 AM

## 2021-08-13 NOTE — Progress Notes (Signed)
Progress Note  Patient Name: Shaun [name] Date of Encounter: 08/13/2021  Primary Cardiologist: Kirk Ruths, MD  Subjective   Sedated on the ventilator as before.  Inpatient Medications    Scheduled Meds:  acetylcysteine  2 mL Nebulization QID   ARIPiprazole  2 mg Per Tube Daily   atorvastatin  80 mg Per Tube Daily   chlorhexidine gluconate (MEDLINE KIT)  15 mL Mouth Rinse BID   Chlorhexidine Gluconate Cloth  6 each Topical Daily   docusate  100 mg Per Tube BID   enoxaparin (LOVENOX) injection  95 mg Subcutaneous Q12H   escitalopram  20 mg Per Tube Daily   feeding supplement (PROSource TF)  45 mL Per Tube BID   free water  250 mL Per Tube Q4H   insulin aspart  0-15 Units Subcutaneous Q4H   levalbuterol  1.25 mg Nebulization QID   mouth rinse  15 mL Mouth Rinse 10 times per day   methylPREDNISolone (SOLU-MEDROL) injection  40 mg Intravenous Q12H   mupirocin ointment   Nasal BID   pantoprazole (PROTONIX) IV  40 mg Intravenous Q24H   sodium chloride flush  10-40 mL Intracatheter Q12H   Continuous Infusions:  amiodarone 30 mg/hr (08/13/21 0733)   ceFEPime (MAXIPIME) IV 2 g (08/13/21 0823)   feeding supplement (VITAL AF 1.2 CAL) 1,000 mL (08/12/21 1415)   fentaNYL infusion INTRAVENOUS 150 mcg/hr (08/13/21 0733)   midazolam 3 mg/hr (08/13/21 0733)   PRN Meds: acetaminophen **OR** acetaminophen, bisacodyl, ondansetron **OR** ondansetron (ZOFRAN) IV, sodium chloride flush   Vital Signs    Vitals:   08/13/21 0645 08/13/21 0700 08/13/21 0759 08/13/21 0800  BP: 107/66 109/69 105/62 105/62  Pulse: 97 94 (!) 101 (!) 104  Resp: 19 (!) 21 (!) 25 (!) 24  Temp:   98.7 F (37.1 C)   TempSrc:   Axillary   SpO2: 94% 95% 95% 95%  Weight:      Height:  _0  (1.727 m)      Intake/Output Summary (Last 24 hours) at 08/13/2021 0823 Last data filed at 08/13/2021 0733 Gross per 24 hour  Intake 1141.58 ml  Output 2200 ml  Net -1058.42 ml   Filed Weights   08/11/21  0400 08/12/21 0401 08/13/21 0409  Weight: 91.7 kg 94.3 kg 94.9 kg    Telemetry    Atrial fibrillation.  Personally reviewed.  ECG    An ECG dated 08/10/2021 was personally reviewed today and demonstrated:  Atrial fibrillation.  Physical Exam   GEN: No acute distress.   Neck: No JVD. Cardiac: Irregularly irregular without gallop.  Respiratory: Nonlabored on ventilator, no wheezing. GI: Soft, nontender, bowel sounds present. MS: No pitting edema. Neuro: Sedated. Psych: Sedated.  Labs    Chemistry Recent Labs  Lab 08/07/21 0556 08/08/21 0423 08/09/21 1623 08/10/21 0350 08/11/21 0356 08/12/21 0510  NA 134*   < > 131* 131* 134* 137  K 5.0   < > 5.0 3.9 4.2 4.4  CL 102   < > 98 99 100 103  CO2 26   < > _1 GLUCOSE 194*   < > 223* 239* 208* 286*  BUN 24*   < > 28* 29* 40* 62*  CREATININE 1.08   < > 1.06 0.92 0.97 1.32*  CALCIUM 8.6*   < > 8.1* 7.9* 7.9* 8.1*  PROT 6.9  --  7.0  --  5.7*  --   ALBUMIN 3.4*  --  3.3*  --  2.5*  --   AST 24  --  28  --  15  --   ALT 50*  --  47*  --  31  --   ALKPHOS 96  --  101  --  75  --   BILITOT 1.0  --  2.1*  --  0.7  --   GFRNONAA >60   < > >60 >60 >60 60*  ANIONGAP 6   < > _0 < > = values in this interval not displayed.     Hematology Recent Labs  Lab 08/10/21 0350 08/11/21 0356 08/12/21 0510  WBC 15.2* 17.4* 21.6*  RBC 4.40 4.23 4.24  HGB 12.6* 12.1* 12.0*  HCT 37.9* 37.0* 37.5*  MCV 86.1 87.5 88.4  MCH 28.6 28.6 28.3  MCHC 33.2 32.7 32.0  RDW 13.9 14.0 14.1  PLT 197 182 167    Cardiac Enzymes Recent Labs  Lab 08/06/21 2140 08/06/21 2348 08/10/21 0350  TROPONINIHS _1 BNP Recent Labs  Lab 08/06/21 1908 08/09/21 0429  BNP 75.0 78.0     Radiology    DG Chest Port 1 View  Result Date: 08/12/2021 CLINICAL DATA:  Acute respiratory failure with hypoxemia. EXAM: PORTABLE CHEST 1 VIEW COMPARISON:  08/10/2021 FINDINGS: An endotracheal tube remains in place terminating proximally 5  cm above the carina. An enteric tube courses into the abdomen with tip not imaged. A left jugular catheter terminates over the upper SVC, unchanged. The patient is rotated to the right with persistent mild enlargement of the cardiac silhouette. Lung volumes remain low with mild central pulmonary vascular congestion. Patchy airspace opacities are present in the left greater than right lung bases with improved aeration of the lung bases compared to the prior study, particularly on the left. No sizable pleural effusion or pneumothorax is identified. IMPRESSION: Improved aeration of the lung bases. Electronically Signed   By: Logan Bores M.D.   On: 08/12/2021 09:06    Assessment & Plan    1.  Permanent atrial fibrillation with CHA2DS2-VASc score of 4-5.  He has had recent RVR in the setting of acute hypoxic respiratory failure with influenza A.  He continues on IV amiodarone for heart rate control, beta-blocker held at this point. Eliquis transitioned to Lovenox.  High-sensitivity troponin I levels normal.  Follow-up echocardiogram reveals reserved LVEF at 60 to 65% with moderate LVH.  Heart rate control remains reasonable.  2.  Hypoxic respiratory failure with ventilator requirement in the setting of influenza A and possibly associated acute on chronic diastolic heart failure.  Chest x-ray shows some improvement, hopefully getting closer to attempt at weaning.  Currently on empiric antibiotics with Maxipime, completed course of Tamiflu.  Approximately 1000 cc out more than yesterday.  3.  History of CAD status post DES to the distal circumflex in 2016, patent at angiography in 2018 with mild to moderate residual CAD.  Continue Lipitor, Lopressor presently on hold.  Plan to continue IV amiodarone and Lovenox.  Hold off on IV Lasix today.  Pulmonary/CCM continues to follow with plan to transfer to [facility] when bed becomes available.  Cardiology service will continue to follow.  Signed, Rozann Lesches, MD  08/13/2021, 8:23 AM

## 2021-08-13 NOTE — Progress Notes (Signed)
Shaun [name] has arrived from [facility] this evening. FiO2 45>> 60% with transport. Unknown transport vent settings. Sedated.  BP 121/68    Pulse 96    Temp 98.2 F (36.8 C) (Axillary)    Resp (!) 24    Ht 5\' 8"  (1.727 m)    Wt 94.9 kg    SpO2 94%    BMI 31.81 kg/m  Critically ill appearing man lying in bed intubated, sedated PERRL, RASS -4, breathing over the vent, weak cough thin clear secretions from ETT, CTAB, Pplat ~21 on PEEP 10 S1S2, irreg rhythm, reg rate Abd soft, NT Skin warm, slightly red in sun exposed areas, no rashes  BGs all >200  A&P: Flu A pneumonia, acute respiratory failure with hypoxia -LTVV-- meeting all goals on 8cc -CXR in AM -FiO2 back down to 50%; wean FiO2 & PEEP per ARDS ladder. D/w RT -PAD protocol for sedation; no weaning overnight until down on FiO2 -completed 5 days of tamiflu -con't cefepime  Uncontrolled hyperglycemia -adding levemir 15 units BID -SSI PRN -goal BG 140-180  AKI -monitor -strict I/Os -renally dose meds, avoid nephrotoxics  Anemia- stable -transfuse for Hb<7 or hemodynamically significant bleeding  Reviewed case with bedside RNs.   Julian Hy, DO 08/13/21 7:00 PM [facility] [place] [facility]

## 2021-08-13 NOTE — Progress Notes (Addendum)
NAME:  Shaun [name], MRN:  [id number], DOB:  [birth year], LOS: 7 ADMISSION DATE:  08/06/2021, CONSULTATION DATE:  12/12 REFERRING MD:  Richarda Blade, CHIEF COMPLAINT:  resp distress    History of Present Illness:     11 yowm active smoker with nl pfts 06/2019 with history of anxiety, ASCVD, CHF,  diabetes mellitus type 2, hyperlipidemia, hypertension, obstructive sleep apnea, CAF and more presented to ED with chief complaint of dyspnea.  Patient reports he has had fever and dyspnea for 1 week PTA. He reports that he was diagnosed with the flu 6 days PTA.  He was given a steroid and Tessalon Perles per his report.   He had a T-max at home of 102.    Patient does admit to orthopnea but no increase in peripheral edema.  He has a cough but is only occasionally productive with green sputum.  No hemoptysis.  He does have some chest tightness that is been going on for the whole week.  It is worse with exertion, better with rest.  Worse lying  flat as well.  Patient reports feeling lightheaded when he has the chest tightness.    He does not wear oxygen at home.    He does sleep with a CPAP at home.  Patient does report decreased p.o. intake.  Yesterday all he had all day was chicken soup.  Last bowel movement was yesterday.      The ED Temp 99.7, heart rate 101-125, respiratory rate 16-25, blood pressure 125/88, satting 96% on 3 L nasal cannula  Was improving but rapidly worse sob / desats 12/12 requiring bipap and then intubation by anesthesia         Pertinent  Medical History  CAF - no recent echo, lat one 03/2019  Mod LAE with elevated LVEDP HBP DM OSA on CPAP    Significant Hospital Events: Including procedures, antibiotic start and stop dates in addition to other pertinent events   Resp viral panel 12/9 >  POS FLU - tamiflu  rx 12/10 - 12/14  Oral ET  12/12  L IJ  CVL 12/12 Placed on Amiodarone drip 12/12>>> MRSA screen 12/12   POS BC x 2 12/12 >>> Maxepime per [facility] 12/12 >>> Trach  sample  12/13 mixed orgs, ambundant wbc>>> nl flora 12/13 Echo Mod lvh, mild LAE 12/14 added mucomyst for LLL Atx> improved 12/15 - d/c 12/16    Scheduled Meds:  acetylcysteine  2 mL Nebulization QID   ARIPiprazole  2 mg Per Tube Daily   atorvastatin  80 mg Per Tube Daily   chlorhexidine gluconate (MEDLINE KIT)  15 mL Mouth Rinse BID   Chlorhexidine Gluconate Cloth  6 each Topical Daily   docusate  100 mg Per Tube BID   enoxaparin (LOVENOX) injection  95 mg Subcutaneous Q12H   escitalopram  20 mg Per Tube Daily   feeding supplement (PROSource TF)  45 mL Per Tube BID   free water  250 mL Per Tube Q4H   insulin aspart  0-15 Units Subcutaneous Q4H   levalbuterol  1.25 mg Nebulization QID   mouth rinse  15 mL Mouth Rinse 10 times per day   methylPREDNISolone (SOLU-MEDROL) injection  40 mg Intravenous Q12H   mupirocin ointment   Nasal BID   pantoprazole (PROTONIX) IV  40 mg Intravenous Q24H   sodium chloride flush  10-40 mL Intracatheter Q12H   Continuous Infusions:  amiodarone 30 mg/hr (08/13/21 1421)   ceFEPime (MAXIPIME) IV Stopped (08/13/21 0854)  feeding supplement (VITAL AF 1.2 CAL) 1,000 mL (08/13/21 0830)   fentaNYL infusion INTRAVENOUS 150 mcg/hr (08/13/21 1003)   midazolam 3.5 mg/hr (08/13/21 1400)   PRN Meds:.acetaminophen **OR** acetaminophen, bisacodyl, ondansetron **OR** ondansetron (ZOFRAN) IV, sodium chloride flush    Interim History / Subjective:  Still requiring peep 10 with borderline sats on 50% 02   Objective   Blood pressure (!) 115/53, pulse 96, temperature 98.4 F (36.9 C), temperature source Axillary, resp. rate (!) 26, height _0  (1.727 m), weight 94.9 kg, SpO2 91 %.    Vent Mode: PRVC FiO2 (%):  [45 %-70 %] 45 % Set Rate:  [24 bmp] 24 bmp Vt Set:  [480 mL] 480 mL PEEP:  [5 cmH20-10 cmH20] 10 cmH20 Plateau Pressure:  [16 cmH20-24 cmH20] 22 cmH20   Intake/Output Summary (Last 24 hours) at 08/13/2021 1501 Last data filed at 08/13/2021 1003 Gross  per 24 hour  Intake 1119.05 ml  Output 950 ml  Net 169.05 ml   Filed Weights   08/11/21 0400 08/12/21 0401 08/13/21 0409  Weight: 91.7 kg 94.3 kg 94.9 kg       Examination: Tmax    98.8 General appearance:    sedated on vent / breathing at back up rate  No jvd Oropharynx oral et/ og Neck supple Lungs with a few scattered exp > insp rhonchi bilaterally IRIR  no s3 or or sign murmur rate around 90-100 Abd obese with limited  excursion  Extr warm with no edema or clubbing noted Neuro  Sensorium sedated ,  no apparent motor deficits      I personally reviewed images and agree with radiology impression as follows:  CXR:   portable 12/15 Improved aeration of the lung bases.       Assessment & Plan:  1) Acute resp distress in pt with Influenza A infection/? Pna and component of acute on chronic chf with diastolic dysfunction likely esp in RAF  >>>continue  Vt  7 cc /kg/ adjust PEEP per ARDS protocol   >>> keep on dry side if bun/ creat/uop allow  2) Prob underlyingAB in this smoker but no evidence of COPD >>> continue xopenex/ added mucomyst noon 12/14 - noon 12/16 > LLL atx resolved so d/c'd   3 ) CAF better controlled > controlled Amiodarone drip since 12/12 >>> rx per cards  4)  hypotension resolved, off neo since am 12/14  but worse azotemia 12/15     5) AKI Lab Results  Component Value Date   CREATININE 1.32 (H) 08/12/2021   CREATININE 0.97 08/11/2021   CREATININE 0.92 08/10/2021   >>> May need albumin to build back intrasvasc volume though limited evidence that this is effective so defer to [facility]/cards       Best Practice (right click and "Reselect all SmartList Selections" daily)   Diet/type: tube feeding  DVT prophylaxis: LMWH GI prophylaxis: PPI Lines: N/A Foley:  Yes, and it is still needed Code Status:  full code Last date of multidisciplinary goals of care discussion   wife at bedside  12/16  advised awaiting bed   Labs   CBC: Recent Labs   Lab 08/09/21 0429 08/09/21 1623 08/10/21 0350 08/11/21 0356 08/12/21 0510  WBC 13.4* 16.1* 15.2* 17.4* 21.6*  NEUTROABS  --  13.5*  --   --   --   HGB 13.9 14.3 12.6* 12.1* 12.0*  HCT 42.3 43.3 37.9* 37.0* 37.5*  MCV 86.5 86.6 86.1 87.5 88.4  PLT 190 195 197 182 167  Basic Metabolic Panel: Recent Labs  Lab 08/07/21 0556 08/08/21 0423 08/09/21 0429 08/09/21 1623 08/10/21 0350 08/11/21 0356 08/12/21 0510  NA 134*   < > 131* 131* 131* 134* 137  K 5.0   < > 4.6 5.0 3.9 4.2 4.4  CL 102   < > 100 98 99 100 103  CO2 26   < > _0 GLUCOSE 194*   < > 149* 223* 239* 208* 286*  BUN 24*   < > 28* 28* 29* 40* 62*  CREATININE 1.08   < > 0.96 1.06 0.92 0.97 1.32*  CALCIUM 8.6*   < > 8.3* 8.1* 7.9* 7.9* 8.1*  MG 2.3  --   --   --  2.4 2.4  --   PHOS  --   --   --   --  3.1 3.8  --    < > = values in this interval not displayed.   GFR: Estimated Creatinine Clearance: 62.3 mL/min (A) (by C-G formula based on SCr of 1.32 mg/dL (H)). Recent Labs  Lab 08/09/21 1623 08/09/21 1833 08/10/21 0350 08/10/21 0753 08/11/21 0356 08/12/21 0510  PROCALCITON <0.10  --   --   --   --   --   WBC 16.1*  --  15.2*  --  17.4* 21.6*  LATICACIDVEN 2.9* 2.4*  --  1.4  --   --     Liver Function Tests: Recent Labs  Lab 08/06/21 1908 08/07/21 0556 08/09/21 1623 08/11/21 0356  AST 36 _1 ALT 53* 50* 47* 31  ALKPHOS 96 96 101 75  BILITOT 0.7 1.0 2.1* 0.7  PROT 6.8 6.9 7.0 5.7*  ALBUMIN 3.5 3.4* 3.3* 2.5*   No results for input(s): LIPASE, AMYLASE in the last 168 hours. No results for input(s): AMMONIA in the last 168 hours.  ABG    Component Value Date/Time   PHART 7.353 08/11/2021 1412   PCO2ART 48.0 08/11/2021 1412   PO2ART 78.9 (L) 08/11/2021 1412   HCO3 24.7 08/11/2021 1412   TCO2 25 10/18/2016 1729   ACIDBASEDEF 1.4 08/09/2021 1738   O2SAT 93.7 08/11/2021 1412     Coagulation Profile: Recent Labs  Lab 08/06/21 1908 08/09/21 1722  INR 1.0 1.5*     Cardiac Enzymes: No results for input(s): CKTOTAL, CKMB, CKMBINDEX, TROPONINI in the last 168 hours.  HbA1C: Hgb A1c MFr Bld  Date/Time Value Ref Range Status  08/07/2021 05:56 AM 6.9 (H) 4.8 - 5.6 % Final    Comment:    (NOTE) Pre diabetes:          5.7%-6.4%  Diabetes:              >6.4%  Glycemic control for   <7.0% adults with diabetes   02/10/2021 08:53 AM 6.8 (H) 4.6 - 6.5 % Final    Comment:    Glycemic Control Guidelines for People with Diabetes:Non Diabetic:  <6%Goal of Therapy: <7%Additional Action Suggested:  >8%     CBG: Recent Labs  Lab 08/12/21 1936 08/12/21 2336 08/13/21 0403 08/13/21 0733 08/13/21 1147  GLUCAP 291* 276* 280* 240* 207*          The patient is critically ill with multiple organ systems failure and requires high complexity decision making for assessment and support, frequent evaluation and titration of therapies, application of advanced monitoring technologies and extensive interpretation of multiple databases. Critical Care Time devoted to patient care services described in this note is 40  minutes.   Christinia Gully, MD [facility] [phone]   After 7:00 pm call Elink  [phone]

## 2021-08-13 NOTE — Progress Notes (Addendum)
Inpatient Diabetes Program Recommendations  AACE/ADA: New Consensus Statement on Inpatient Glycemic Control (2015)  Target Ranges:  Prepandial:   less than 140 mg/dL      Peak postprandial:   less than 180 mg/dL (1-2 hours)      Critically ill patients:  140 - 180 mg/dL    Latest Reference Range & Units 08/11/21 23:08 08/12/21 03:58 08/12/21 07:49 08/12/21 12:05 08/12/21 16:39 08/12/21 19:36  Glucose-Capillary 70 - 99 mg/dL 242 (H) 280 (H) 240 (H) 291 (H) 315 (H) 291 (H)    Latest Reference Range & Units 08/12/21 23:36 08/13/21 04:03 08/13/21 07:33  Glucose-Capillary 70 - 99 mg/dL 276 (H) 280 (H) 240 (H)     Home DM Meds: Metformin 500 mg BID  Current Orders: Novolog 0-15 units Q4H    Getting Solumedrol 40 mg Q12H  Vital tube feeds @ 65 ml/hr    MD- Please consider:  1. Start Semglee 10 units Daily (0.1 units/kg)  2. Start Novolog 4 units Q4 hours for Tube Feed Coverage HOLD if tube feeds HELD for any reason     --Will follow patient during hospitalization--  Wyn Quaker RN, MSN, CDE Diabetes Coordinator Inpatient Glycemic Control Team Team Pager: [phone] (8a-5p)

## 2021-08-14 ENCOUNTER — Inpatient Hospital Stay (HOSPITAL_COMMUNITY): Payer: 59

## 2021-08-14 DIAGNOSIS — I5033 Acute on chronic diastolic (congestive) heart failure: Secondary | ICD-10-CM | POA: Diagnosis not present

## 2021-08-14 DIAGNOSIS — E669 Obesity, unspecified: Secondary | ICD-10-CM | POA: Diagnosis not present

## 2021-08-14 DIAGNOSIS — Z20822 Contact with and (suspected) exposure to covid-19: Secondary | ICD-10-CM | POA: Diagnosis not present

## 2021-08-14 DIAGNOSIS — I25119 Atherosclerotic heart disease of native coronary artery with unspecified angina pectoris: Secondary | ICD-10-CM | POA: Diagnosis not present

## 2021-08-14 DIAGNOSIS — J1008 Influenza due to other identified influenza virus with other specified pneumonia: Secondary | ICD-10-CM | POA: Diagnosis not present

## 2021-08-14 DIAGNOSIS — R6521 Severe sepsis with septic shock: Secondary | ICD-10-CM | POA: Diagnosis not present

## 2021-08-14 DIAGNOSIS — E870 Hyperosmolality and hypernatremia: Secondary | ICD-10-CM | POA: Diagnosis not present

## 2021-08-14 DIAGNOSIS — G2581 Restless legs syndrome: Secondary | ICD-10-CM | POA: Diagnosis not present

## 2021-08-14 DIAGNOSIS — J101 Influenza due to other identified influenza virus with other respiratory manifestations: Secondary | ICD-10-CM | POA: Diagnosis not present

## 2021-08-14 DIAGNOSIS — G4733 Obstructive sleep apnea (adult) (pediatric): Secondary | ICD-10-CM | POA: Diagnosis not present

## 2021-08-14 DIAGNOSIS — J9601 Acute respiratory failure with hypoxia: Secondary | ICD-10-CM | POA: Diagnosis not present

## 2021-08-14 DIAGNOSIS — J09X1 Influenza due to identified novel influenza A virus with pneumonia: Secondary | ICD-10-CM | POA: Diagnosis not present

## 2021-08-14 DIAGNOSIS — Z23 Encounter for immunization: Secondary | ICD-10-CM | POA: Diagnosis not present

## 2021-08-14 DIAGNOSIS — E875 Hyperkalemia: Secondary | ICD-10-CM | POA: Diagnosis not present

## 2021-08-14 DIAGNOSIS — J159 Unspecified bacterial pneumonia: Secondary | ICD-10-CM | POA: Diagnosis not present

## 2021-08-14 DIAGNOSIS — I10 Essential (primary) hypertension: Secondary | ICD-10-CM | POA: Diagnosis not present

## 2021-08-14 DIAGNOSIS — R1310 Dysphagia, unspecified: Secondary | ICD-10-CM | POA: Diagnosis not present

## 2021-08-14 DIAGNOSIS — I5032 Chronic diastolic (congestive) heart failure: Secondary | ICD-10-CM | POA: Diagnosis not present

## 2021-08-14 DIAGNOSIS — Z9911 Dependence on respirator [ventilator] status: Secondary | ICD-10-CM | POA: Diagnosis not present

## 2021-08-14 DIAGNOSIS — E119 Type 2 diabetes mellitus without complications: Secondary | ICD-10-CM | POA: Diagnosis not present

## 2021-08-14 DIAGNOSIS — A419 Sepsis, unspecified organism: Secondary | ICD-10-CM | POA: Diagnosis not present

## 2021-08-14 DIAGNOSIS — I482 Chronic atrial fibrillation, unspecified: Secondary | ICD-10-CM | POA: Diagnosis not present

## 2021-08-14 LAB — BASIC METABOLIC PANEL
Anion gap: 3 — ABNORMAL LOW (ref 5–15)
BUN: 57 mg/dL — ABNORMAL HIGH (ref 8–23)
CO2: 28 mmol/L (ref 22–32)
Calcium: 8 mg/dL — ABNORMAL LOW (ref 8.9–10.3)
Chloride: 106 mmol/L (ref 98–111)
Creatinine, Ser: 1.08 mg/dL (ref 0.61–1.24)
GFR, Estimated: >60 mL/min (ref >60)
Glucose, Bld: 318 mg/dL — ABNORMAL HIGH (ref 70–99)
Potassium: 5.5 mmol/L — ABNORMAL HIGH (ref 3.5–5.1)
Sodium: 137 mmol/L (ref 135–145)

## 2021-08-14 LAB — CBC
HCT: 34.6 % — ABNORMAL LOW (ref 39.0–52.0)
Hemoglobin: 10.9 g/dL — ABNORMAL LOW (ref 13.0–17.0)
MCH: 27.7 pg (ref 26.0–34.0)
MCHC: 31.5 g/dL (ref 30.0–36.0)
MCV: 88 fL (ref 80.0–100.0)
Platelets: 146 10*3/uL — ABNORMAL LOW (ref 150–400)
RBC: 3.93 MIL/uL — ABNORMAL LOW (ref 4.22–5.81)
RDW: 14.4 % (ref 11.5–15.5)
WBC: 15.7 10*3/uL — ABNORMAL HIGH (ref 4.0–10.5)
nRBC: 0 % (ref 0.0–0.2)

## 2021-08-14 LAB — CULTURE, BLOOD (ROUTINE X 2)
Culture: NO GROWTH
Culture: NO GROWTH

## 2021-08-14 LAB — GLUCOSE, CAPILLARY
Glucose-Capillary: 235 mg/dL — ABNORMAL HIGH (ref 70–99)
Glucose-Capillary: 250 mg/dL — ABNORMAL HIGH (ref 70–99)
Glucose-Capillary: 270 mg/dL — ABNORMAL HIGH (ref 70–99)
Glucose-Capillary: 283 mg/dL — ABNORMAL HIGH (ref 70–99)
Glucose-Capillary: 292 mg/dL — ABNORMAL HIGH (ref 70–99)
Glucose-Capillary: 308 mg/dL — ABNORMAL HIGH (ref 70–99)
Glucose-Capillary: 344 mg/dL — ABNORMAL HIGH (ref 70–99)

## 2021-08-14 LAB — PHOSPHORUS: Phosphorus: 3.3 mg/dL (ref 2.5–4.6)

## 2021-08-14 LAB — MAGNESIUM: Magnesium: 2.7 mg/dL — ABNORMAL HIGH (ref 1.7–2.4)

## 2021-08-14 MED ORDER — PROPOFOL 1000 MG/100ML IV EMUL
5.0000 ug/kg/min | INTRAVENOUS | Status: DC
  Administered 2021-08-14: 10 ug/kg/min via INTRAVENOUS
  Administered 2021-08-14: 30 ug/kg/min via INTRAVENOUS
  Administered 2021-08-15 (×2): 40 ug/kg/min via INTRAVENOUS
  Administered 2021-08-15: 05:00:00 35 ug/kg/min via INTRAVENOUS
  Administered 2021-08-15 (×2): 40 ug/kg/min via INTRAVENOUS
  Administered 2021-08-15 – 2021-08-16 (×2): 35 ug/kg/min via INTRAVENOUS
  Administered 2021-08-16 (×3): 40 ug/kg/min via INTRAVENOUS
  Administered 2021-08-16: 15:00:00 45 ug/kg/min via INTRAVENOUS
  Administered 2021-08-16 – 2021-08-17 (×3): 40 ug/kg/min via INTRAVENOUS
  Administered 2021-08-18: 06:00:00 20 ug/kg/min via INTRAVENOUS
  Administered 2021-08-18: 01:00:00 35 ug/kg/min via INTRAVENOUS
  Filled 2021-08-14 (×21): qty 100

## 2021-08-14 MED ORDER — SODIUM ZIRCONIUM CYCLOSILICATE 10 G PO PACK
10.0000 g | PACK | Freq: Once | ORAL | Status: AC
  Administered 2021-08-14: 10 g
  Filled 2021-08-14: qty 1

## 2021-08-14 MED ORDER — INSULIN ASPART 100 UNIT/ML IJ SOLN
0.0000 [IU] | INTRAMUSCULAR | Status: DC
  Administered 2021-08-14: 11 [IU] via SUBCUTANEOUS
  Administered 2021-08-14: 15 [IU] via SUBCUTANEOUS
  Administered 2021-08-14: 11 [IU] via SUBCUTANEOUS
  Administered 2021-08-14: 7 [IU] via SUBCUTANEOUS
  Administered 2021-08-14: 11 [IU] via SUBCUTANEOUS
  Administered 2021-08-14: 7 [IU] via SUBCUTANEOUS
  Administered 2021-08-15: 16:00:00 15 [IU] via SUBCUTANEOUS
  Administered 2021-08-15 (×4): 11 [IU] via SUBCUTANEOUS
  Administered 2021-08-15: 04:00:00 15 [IU] via SUBCUTANEOUS
  Administered 2021-08-16: 08:00:00 7 [IU] via SUBCUTANEOUS
  Administered 2021-08-16: 17:00:00 20 [IU] via SUBCUTANEOUS
  Administered 2021-08-16: 7 [IU] via SUBCUTANEOUS
  Administered 2021-08-16: 12:00:00 11 [IU] via SUBCUTANEOUS
  Administered 2021-08-16: 20:00:00 15 [IU] via SUBCUTANEOUS
  Administered 2021-08-16: 03:00:00 7 [IU] via SUBCUTANEOUS
  Administered 2021-08-17: 20:00:00 11 [IU] via SUBCUTANEOUS
  Administered 2021-08-17: 05:00:00 4 [IU] via SUBCUTANEOUS
  Administered 2021-08-17: 09:00:00 7 [IU] via SUBCUTANEOUS
  Administered 2021-08-17 (×2): 11 [IU] via SUBCUTANEOUS
  Administered 2021-08-18 (×2): 4 [IU] via SUBCUTANEOUS
  Administered 2021-08-18: 17:00:00 15 [IU] via SUBCUTANEOUS
  Administered 2021-08-18: 20:00:00 11 [IU] via SUBCUTANEOUS
  Administered 2021-08-18: 09:00:00 7 [IU] via SUBCUTANEOUS
  Administered 2021-08-18 (×2): 4 [IU] via SUBCUTANEOUS
  Administered 2021-08-19: 04:00:00 7 [IU] via SUBCUTANEOUS
  Administered 2021-08-19 (×2): 4 [IU] via SUBCUTANEOUS
  Administered 2021-08-20: 04:00:00 3 [IU] via SUBCUTANEOUS
  Administered 2021-08-20: 08:00:00 4 [IU] via SUBCUTANEOUS

## 2021-08-14 MED ORDER — INSULIN DETEMIR 100 UNIT/ML ~~LOC~~ SOLN
20.0000 [IU] | Freq: Two times a day (BID) | SUBCUTANEOUS | Status: DC
  Administered 2021-08-14 – 2021-08-18 (×10): 20 [IU] via SUBCUTANEOUS
  Filled 2021-08-14 (×12): qty 0.2

## 2021-08-14 MED ORDER — FUROSEMIDE 10 MG/ML IJ SOLN
40.0000 mg | Freq: Once | INTRAMUSCULAR | Status: AC
  Administered 2021-08-14: 40 mg via INTRAVENOUS
  Filled 2021-08-14: qty 4

## 2021-08-14 MED ORDER — INSULIN ASPART 100 UNIT/ML IJ SOLN
2.0000 [IU] | INTRAMUSCULAR | Status: DC
Start: 2021-08-14 — End: 2021-08-15
  Administered 2021-08-14 – 2021-08-15 (×6): 2 [IU] via SUBCUTANEOUS

## 2021-08-14 NOTE — Progress Notes (Signed)
NAME:  Shaun [name], MRN:  [id number], DOB:  1956-05-21, LOS: 8 ADMISSION DATE:  08/06/2021, CONSULTATION DATE:  12/12 REFERRING MD:  Richarda Blade, CHIEF COMPLAINT:  resp distress    History of Present Illness:     Shaun [name] is a 65 y/o active smoker with normal PFTs 06/2019 with history of anxiety, ASCVD, CHF,  diabetes mellitus type 2, hyperlipidemia, hypertension, obstructive sleep apnea, CAF and more presented to ED with chief complaint of dyspnea.  Patient reports he has had fever and dyspnea for 1 week PTA. He reports that he was diagnosed with the flu 6 days PTA.  He was given a steroid and Tessalon Perles per his report.   He had a T-max at home of 102.    Patient does admit to orthopnea but no increase in peripheral edema.  He has a cough but is only occasionally productive with green sputum.  No hemoptysis.  He does have some chest tightness that is been going on for the whole week.  It is worse with exertion, better with rest.  Worse lying  flat as well.  Patient reports feeling lightheaded when he has the chest tightness.    He does not wear oxygen at home.    He does sleep with a CPAP at home.  Patient does report decreased p.o. intake.  Yesterday all he had all day was chicken soup.  Last bowel movement was yesterday.      The ED Temp 99.7, heart rate 101-125, respiratory rate 16-25, blood pressure 125/88, satting 96% on 3 L nasal cannula  Was improving but rapidly worse sob / desats 12/12 requiring bipap and then intubation by anesthesia    Pertinent  Medical History  CAF - no recent echo, lat one 03/2019  Mod LAE with elevated LVEDP HBP DM OSA on CPAP    Significant Hospital Events: Including procedures, antibiotic start and stop dates in addition to other pertinent events   Resp viral panel 12/9 >  POS FLU - tamiflu  rx 12/10 - 12/14  Oral ET  12/12  L IJ  CVL 12/12 Placed on Amiodarone drip 12/12>>> MRSA screen 12/12   POS BC x 2 12/12 >>> Maxepime per [facility]  12/12 >>> Trach sample  12/13 mixed orgs, ambundant wbc>>> nl flora 12/13 Echo Mod lvh, mild LAE 12/14 added mucomyst for LLL Atx> improved 12/15 - d/c 12/16  12/17 transfer to [facility]   Interim History / Subjective:  This morning he endorses discomfort and wants to be repositioned.  Objective   Blood pressure 118/68, pulse 96, temperature 98.6 F (37 C), temperature source Axillary, resp. rate (!) 21, height 5\' 8"  (1.727 m), weight 95 kg, SpO2 92 %.    Vent Mode: PRVC FiO2 (%):  [50 %-60 %] 50 % Set Rate:  [24 bmp] 24 bmp Vt Set:  [540 mL] 540 mL PEEP:  [10 cmH20] 10 cmH20 Plateau Pressure:  [14 cmH20-20 cmH20] 19 cmH20   Intake/Output Summary (Last 24 hours) at 08/14/2021 1314 Last data filed at 08/14/2021 0813 Gross per 24 hour  Intake 4357.31 ml  Output 2485 ml  Net 1872.31 ml   Filed Weights   08/13/21 0409 08/13/21 1846 08/14/21 0423  Weight: 94.9 kg 95.4 kg 95 kg     Examination: General appearance:  critically ill appearing man lying in bed in NAD, intubated, sedated HEENT: [place]/AT, eyes anicteric, ETT in place Neck LIJ CVC Resp: CTAB, synchronous with MV. Pplat 25, thin secretions from eTT Cardio: S1S2,  irreg rhythm, reg rate Abd: obese, soft, NT Extremities mild peripheral edema, no cyanosis Neuro: RASS -1, nodding to answer questions. Moving all extremities, following some commands.   CXR personally reviewed>bibasilar opacities improving, ETT, LIJ CVC in appropriate position  BUN 57 Cr 1.08 WBC 15.7 H/H 10.9/34.6 Platelets 146 Resp cx> normal flora  Assessment & Plan:  Acute respiratory failure with hypoxia in pt with Influenza A. Concern for post-flu bacterial pneumonia, possibly acute pulmonary edema. -LTVV, 3-8cc/kg IBW, meeting [place] and DP goals -VAP prevention protocol -PAD protocol for sedation> switching versed to propofol today, con't fent -SAT & SBT daily as appropriate> hopefully tomorrow. Weaning vent as able to 40%/5 before SBT -wean FiO2  and PEEP per ARDS ladder -con't steroids -lasix; goal net neg fluid balance -needs to quit smoking -cefepime  Chronic Afib - controlled on Amiodarone IV since 12/12 -con't amiodarone -holding PTA apixaban, metoprolol  Shock resolved, off neo since am 12/14  -can resume norepi PRN if needed with propofol  AKI, improved -strict I/Os -renally dose meds, avoid nephrotoxic meds -monitor  Hyperglycemia, uncontrolled -levemir increased to 20 units BID -con't SSI PRN -adding TF coverage with hold parameters -goal BG 140-180  Hyperkalemia -lasix -lokelma  -increasing insulin today -con't to monitor  Anemia -transfuse from Hb<7 or hemodynamically significant bleeding  Thrombocytopenia, potentially due to antibiotics -monitor  Tobacco abuse -discussed with wife how important it will be to quit smoking; can counsel him when more appropriate  Wife updated at bedside.  Best Practice (right click and "Reselect all SmartList Selections" daily)   Diet/type: tube feeding  DVT prophylaxis: LMWH GI prophylaxis: PPI Lines: Central line Foley:  Yes, and it is still needed Code Status:  full code Last date of multidisciplinary goals of care discussion  12/17   Labs   CBC: Recent Labs  Lab 08/09/21 1623 08/10/21 0350 08/11/21 0356 08/12/21 0510 08/14/21 0258  WBC 16.1* 15.2* 17.4* 21.6* 15.7*  NEUTROABS 13.5*  --   --   --   --   HGB 14.3 12.6* 12.1* 12.0* 10.9*  HCT 43.3 37.9* 37.0* 37.5* 34.6*  MCV 86.6 86.1 87.5 88.4 88.0  PLT 195 197 182 167 146*    Basic Metabolic Panel: Recent Labs  Lab 08/09/21 1623 08/10/21 0350 08/11/21 0356 08/12/21 0510 08/14/21 0258  NA 131* 131* 134* 137 137  K 5.0 3.9 4.2 4.4 5.5*  CL 98 99 100 103 106  CO2 24 24 25 25 28   GLUCOSE 223* 239* 208* 286* 318*  BUN 28* 29* 40* 62* 57*  CREATININE 1.06 0.92 0.97 1.32* 1.08  CALCIUM 8.1* 7.9* 7.9* 8.1* 8.0*  MG  --  2.4 2.4  --  2.7*  PHOS  --  3.1 3.8  --  3.3   GFR: Estimated  Creatinine Clearance: 76.2 mL/min (by C-G formula based on SCr of 1.08 mg/dL). Recent Labs  Lab 08/09/21 1623 08/09/21 1833 08/10/21 0350 08/10/21 0753 08/11/21 0356 08/12/21 0510 08/14/21 0258  PROCALCITON <0.10  --   --   --   --   --   --   WBC 16.1*  --  15.2*  --  17.4* 21.6* 15.7*  LATICACIDVEN 2.9* 2.4*  --  1.4  --   --   --     This patient is critically ill with multiple organ system failure which requires frequent high complexity decision making, assessment, support, evaluation, and titration of therapies. This was completed through the application of advanced monitoring technologies and extensive interpretation  of multiple databases. During this encounter critical care time was devoted to patient care services described in this note for 40 minutes.  Julian Hy, DO 08/14/21 1:16 PM [facility]

## 2021-08-14 NOTE — Progress Notes (Signed)
[facility] Progress Note Patient Name: Shaun [name] DOB: Jul 14, 1956 MRN: [id number]   Date of Service  08/14/2021  HPI/Events of Note  CBGs high  eICU Interventions  Increase levemir 20 bid Resistant scale     Intervention Category Intermediate Interventions: Hyperglycemia - evaluation and treatment  Zelma Snead V. Marirose Deveney 08/14/2021, 4:12 AM

## 2021-08-14 NOTE — Progress Notes (Signed)
10 cc versed wasted with Bobbye Morton RN.

## 2021-08-14 NOTE — Progress Notes (Signed)
Progress Note  Patient Name: Shaun [name] Date of Encounter: 08/15/2021  [facility] Cardiologist: Kirk Ruths, MD   Subjective   Remains intubated and sedated this AM. HR 100-120s. Remains on amiodarone gtt.  Inpatient Medications    Scheduled Meds:  ARIPiprazole  2 mg Per Tube Daily   atorvastatin  80 mg Per Tube Daily   chlorhexidine gluconate (MEDLINE KIT)  15 mL Mouth Rinse BID   Chlorhexidine Gluconate Cloth  6 each Topical Daily   docusate  100 mg Per Tube BID   enoxaparin (LOVENOX) injection  95 mg Subcutaneous Q12H   escitalopram  20 mg Per Tube Daily   feeding supplement (PROSource TF)  45 mL Per Tube BID   free water  250 mL Per Tube Q4H   insulin aspart  0-20 Units Subcutaneous Q4H   insulin aspart  5 Units Subcutaneous Q4H   insulin detemir  20 Units Subcutaneous BID   levalbuterol  1.25 mg Nebulization QID   mouth rinse  15 mL Mouth Rinse 10 times per day   methylPREDNISolone (SOLU-MEDROL) injection  40 mg Intravenous Q12H   mupirocin ointment   Nasal BID   pantoprazole (PROTONIX) IV  40 mg Intravenous Q24H   sodium chloride flush  10-40 mL Intracatheter Q12H   Continuous Infusions:  sodium chloride 10 mL/hr at 08/13/21 1848   amiodarone 30 mg/hr (08/15/21 0700)   ceFEPime (MAXIPIME) IV Stopped (08/15/21 0134)   feeding supplement (VITAL AF 1.2 CAL) 1,000 mL (08/14/21 1151)   fentaNYL infusion INTRAVENOUS 50 mcg/hr (08/15/21 0700)   propofol (DIPRIVAN) infusion 30 mcg/kg/min (08/15/21 0700)   PRN Meds: sodium chloride, acetaminophen **OR** acetaminophen, bisacodyl, ondansetron **OR** ondansetron (ZOFRAN) IV, sodium chloride flush   Vital Signs    Vitals:   08/15/21 0600 08/15/21 0700 08/15/21 0735 08/15/21 0740  BP: 108/71 122/75    Pulse: 90 94 87   Resp: (!) 24 (!) 24 (!) 24   Temp:    98.9 F (37.2 C)  TempSrc:    Oral  SpO2: 93% 94% 93%   Weight:      Height:        Intake/Output Summary (Last 24 hours) at 08/15/2021  0857 Last data filed at 08/15/2021 0700 Gross per 24 hour  Intake 6549.23 ml  Output 2575 ml  Net 3974.23 ml   Last 3 Weights 08/15/2021 08/14/2021 08/13/2021  Weight (lbs) 209 lb 7 oz 209 lb 7 oz 210 lb 5.1 oz  Weight (kg) 95 kg 95 kg 95.4 kg      Telemetry    Afib with intermittent episodes of RVR with HR 120s - Personally Reviewed  ECG    No new tracing - Personally Reviewed  Physical Exam   GEN: Intubated, sedated  Neck: JVD to mid-neck Cardiac: Irregularly irregular, no murmurs Respiratory: Coarse vent sounds GI: Soft, nontender, non-distended  MS: No edema; No deformity. Neuro:  Nonfocal  Psych: Normal affect   Labs    High Sensitivity Troponin:   Recent Labs  Lab 08/06/21 2140 08/06/21 2348 08/10/21 0350  TROPONINIHS _0 Chemistry Recent Labs  Lab 08/09/21 1623 08/10/21 0350 08/11/21 0356 08/12/21 0510 08/14/21 0258 08/15/21 0351  NA 131* 131* 134* 137 137 140  K 5.0 3.9 4.2 4.4 5.5* 5.1  CL 98 99 100 103 106 106  CO2 _1 GLUCOSE 223* 239* 208* 286* 318* 326*  BUN 28* 29* 40* 62* 57* 59*  CREATININE 1.06 0.92 0.97 1.32* 1.08 1.02  CALCIUM 8.1* 7.9* 7.9* 8.1* 8.0* 8.4*  MG  --  2.4 2.4  --  2.7*  --   PROT 7.0  --  5.7*  --   --   --   ALBUMIN 3.3*  --  2.5*  --   --   --   AST 28  --  15  --   --   --   ALT 47*  --  31  --   --   --   ALKPHOS 101  --  75  --   --   --   BILITOT 2.1*  --  0.7  --   --   --   GFRNONAA >60 >60 >60 60* >60 >60  ANIONGAP _0 3* 6    Lipids  Recent Labs  Lab 08/15/21 0351  TRIG 182*    Hematology Recent Labs  Lab 08/12/21 0510 08/14/21 0258 08/15/21 0351  WBC 21.6* 15.7* 16.0*  RBC 4.24 3.93* 4.00*  HGB 12.0* 10.9* 11.1*  HCT 37.5* 34.6* 35.8*  MCV 88.4 88.0 89.5  MCH 28.3 27.7 27.8  MCHC 32.0 31.5 31.0  RDW 14.1 14.4 14.6  PLT 167 146* 148*   Thyroid No results for input(s): TSH, FREET4 in the last 168 hours.  BNP Recent Labs  Lab 08/09/21 0429  BNP 78.0     DDimer No results for input(s): DDIMER in the last 168 hours.   Radiology    DG CHEST PORT 1 VIEW  Result Date: 08/14/2021 CLINICAL DATA:  Short of breath.  Follow-up exam. EXAM: PORTABLE CHEST 1 VIEW COMPARISON:  08/12/2021 and older exams. FINDINGS: Interstitial and mild patchy airspace opacities at the lung bases are without significant change from the most recent prior exam. No new lung abnormalities. Endotracheal tube and nasal/orogastric tube are stable. Left internal jugular central venous line is stable. No pneumothorax. IMPRESSION: 1. No significant interval change in lung base opacities when compared to the most recent prior study, and when allowing for differences in patient positioning and technique. No new lung abnormalities. 2. Stable support apparatus. Electronically Signed   By: Lajean Manes M.D.   On: 08/14/2021 09:04    Cardiac Studies   TTE 08/10/21 IMPRESSIONS   1. Left ventricular ejection fraction, by estimation, is 60 to 65%. The  left ventricle has normal function. The left ventricle has no regional  wall motion abnormalities. There is moderate concentric left ventricular  hypertrophy. Left ventricular  diastolic parameters are indeterminate.   2. Right ventricular systolic function is normal. The right ventricular  size is normal. Tricuspid regurgitation signal is inadequate for assessing  PA pressure.   3. Left atrial size was mildly dilated.   4. The mitral valve is grossly normal. Trivial mitral valve  regurgitation.   5. The aortic valve is tricuspid. Aortic valve regurgitation is not  visualized.   6. Unable to estimate CVP.   Comparison(s): Prior images reviewed side by side. LVEF stable at 60-65%.   Patient Profile     65 y.o. male CAD s/p DES to Lcx (2016), permanent Afib, COPD, DMII, HTN, HHLD, OSA who [facility] with dyspnea found to have Influenza A. Course complicated by acute hypoxic respiratory failure requiring intubation and  Afib with RVR for which Cardiology is consulted.   Assessment & Plan    #Acute hypoxic respiratory failure: #Influenza A: Patient presented to [facility] with worsening dyspnea found to be  hypoxic requiring intubation. Now transferred to [facility] hospital for further management. -Vent management per ICU -Completed tamiflu x5 days  #Permanent Afib: CHADs-vasc 4-5. Has had recent RVR in the setting of acute hypoxic respiratory failure with influenza A.  He continues on IV amiodarone for heart rate control, beta-blocker held at this point. Eliquis transitioned to Lovenox.  -Continue amiodarone gtt for now; will plan to transition to PO amio once extubated and more stable from a respiratory standpoint -On lovenox for [facility] -Will plan to transition back to apixban once able  #CAD s/p DES to Lcx in 2016: Patent at angiography in 2018 with mild to moderate residual CAD. EF normal 60-65% in 08/10/21. -Not on ASA due to need for [facility] -Continue lipitor 13m daily -Will add back BB/ARB once able   Cardiology will sign-off at this time. Please feel free to call with questions or concerns.   For questions or updates, please contact [place]Please consult [email or web address] for contact info under        Signed, HFreada Bergeron MD  08/15/2021, 8:57 AM

## 2021-08-15 ENCOUNTER — Other Ambulatory Visit: Payer: 59

## 2021-08-15 DIAGNOSIS — Z9911 Dependence on respirator [ventilator] status: Secondary | ICD-10-CM

## 2021-08-15 LAB — GLUCOSE, CAPILLARY
Glucose-Capillary: 252 mg/dL — ABNORMAL HIGH (ref 70–99)
Glucose-Capillary: 270 mg/dL — ABNORMAL HIGH (ref 70–99)
Glucose-Capillary: 279 mg/dL — ABNORMAL HIGH (ref 70–99)
Glucose-Capillary: 292 mg/dL — ABNORMAL HIGH (ref 70–99)
Glucose-Capillary: 308 mg/dL — ABNORMAL HIGH (ref 70–99)
Glucose-Capillary: 310 mg/dL — ABNORMAL HIGH (ref 70–99)

## 2021-08-15 LAB — BASIC METABOLIC PANEL
Anion gap: 6 (ref 5–15)
BUN: 59 mg/dL — ABNORMAL HIGH (ref 8–23)
CO2: 28 mmol/L (ref 22–32)
Calcium: 8.4 mg/dL — ABNORMAL LOW (ref 8.9–10.3)
Chloride: 106 mmol/L (ref 98–111)
Creatinine, Ser: 1.02 mg/dL (ref 0.61–1.24)
GFR, Estimated: >60 mL/min (ref >60)
Glucose, Bld: 326 mg/dL — ABNORMAL HIGH (ref 70–99)
Potassium: 5.1 mmol/L (ref 3.5–5.1)
Sodium: 140 mmol/L (ref 135–145)

## 2021-08-15 LAB — CBC
HCT: 35.8 % — ABNORMAL LOW (ref 39.0–52.0)
Hemoglobin: 11.1 g/dL — ABNORMAL LOW (ref 13.0–17.0)
MCH: 27.8 pg (ref 26.0–34.0)
MCHC: 31 g/dL (ref 30.0–36.0)
MCV: 89.5 fL (ref 80.0–100.0)
Platelets: 148 10*3/uL — ABNORMAL LOW (ref 150–400)
RBC: 4 MIL/uL — ABNORMAL LOW (ref 4.22–5.81)
RDW: 14.6 % (ref 11.5–15.5)
WBC: 16 10*3/uL — ABNORMAL HIGH (ref 4.0–10.5)
nRBC: 0 % (ref 0.0–0.2)

## 2021-08-15 LAB — TRIGLYCERIDES: Triglycerides: 182 mg/dL — ABNORMAL HIGH (ref <150)

## 2021-08-15 MED ORDER — METHYLPREDNISOLONE SODIUM SUCC 40 MG IJ SOLR
40.0000 mg | Freq: Every day | INTRAMUSCULAR | Status: DC
Start: 2021-08-16 — End: 2021-08-17
  Administered 2021-08-16 – 2021-08-17 (×2): 40 mg via INTRAVENOUS
  Filled 2021-08-15 (×2): qty 1

## 2021-08-15 MED ORDER — FREE WATER
100.0000 mL | Status: DC
Start: 2021-08-15 — End: 2021-08-16
  Administered 2021-08-15 – 2021-08-16 (×6): 100 mL

## 2021-08-15 MED ORDER — FUROSEMIDE 10 MG/ML IJ SOLN
40.0000 mg | Freq: Two times a day (BID) | INTRAMUSCULAR | Status: DC
  Administered 2021-08-15 (×2): 40 mg via INTRAVENOUS
  Filled 2021-08-15 (×2): qty 4

## 2021-08-15 MED ORDER — SODIUM ZIRCONIUM CYCLOSILICATE 10 G PO PACK
10.0000 g | PACK | Freq: Once | ORAL | Status: AC
  Administered 2021-08-15: 11:00:00 10 g
  Filled 2021-08-15: qty 1

## 2021-08-15 MED ORDER — INSULIN ASPART 100 UNIT/ML IJ SOLN
5.0000 [IU] | INTRAMUSCULAR | Status: DC
Start: 2021-08-15 — End: 2021-08-16
  Administered 2021-08-15 – 2021-08-16 (×6): 5 [IU] via SUBCUTANEOUS

## 2021-08-15 NOTE — Progress Notes (Signed)
ANTICOAGULATION CONSULT NOTE - Initial Consult  Pharmacy Consult for lovenox Indication: atrial fibrillation  No Known Allergies  Patient Measurements: Height: 5\' 8"  (172.7 cm) Weight: 95 kg (209 lb 7 oz) IBW/kg (Calculated) : 68.4 Heparin Dosing Weight:   Vital Signs: Temp: 98 F (36.7 C) (12/18 0400) Temp Source: Oral (12/18 0400) BP: 122/75 (12/18 0700) Pulse Rate: 94 (12/18 0700)  Labs: Recent Labs    08/14/21 0258 08/15/21 0351  HGB 10.9* 11.1*  HCT 34.6* 35.8*  PLT 146* 148*  CREATININE 1.08 1.02     Estimated Creatinine Clearance: 80.7 mL/min (by C-G formula based on SCr of 1.02 mg/dL).   Medical History: Past Medical History:  Diagnosis Date   Anemia    Anxiety    Asthma    CAD (coronary artery disease)    DES to distal circumflex 2016   Cataract    Colon polyps    30 colon polyps found on first colonoscopy   Diastolic heart failure (HCC)    Diverticulitis    DJD (degenerative joint disease)    GERD (gastroesophageal reflux disease)    History of kidney stones    Hyperlipidemia    Hypertension    Insomnia    Obstructive sleep apnea 12/2009   01/26/2010 AHI 83/hr   Permanent atrial fibrillation ([place])    Onset 2006 paroxysmal then progressive to persistent   PUD (peptic ulcer disease)    1980s   RLS (restless legs syndrome)    Sinusitis    Skin cancer    Type 2 diabetes mellitus (HCC)     Medications:  Medications Prior to Admission  Medication Sig Dispense Refill Last Dose   albuterol (VENTOLIN HFA) 108 (90 Base) MCG/ACT inhaler INHALE 2 PUFFS INTO THE LUNGS EVERY 6 HOURS AS NEEDED FOR WHEEZING OR SHORTNESS OF BREATH 18 g 11 08/06/2021   apixaban (ELIQUIS) 5 MG TABS tablet TAKE 1 TABLET(5 MG) BY MOUTH TWICE DAILY 180 tablet 1 08/05/2021 at 01200   ARIPiprazole (ABILIFY) 2 MG tablet Take 1 tablet (2 mg total) by mouth daily. 90 tablet 0 08/05/2021   atorvastatin (LIPITOR) 80 MG tablet Take 1 tablet (80 mg total) by mouth daily. TAKE 1  TABLET(80 MG) BY MOUTH EVERY EVENING 90 tablet 3 08/05/2021   benzonatate (TESSALON PERLES) 100 MG capsule Take 1 capsule (100 mg total) by mouth 3 (three) times daily as needed. 20 capsule 0 08/05/2021   buPROPion (WELLBUTRIN XL) 150 MG 24 hr tablet Take 3 tablets once daily (Patient taking differently: Take 150 mg by mouth 3 (three) times daily with meals.) 270 tablet 3 08/05/2021   docusate sodium (COLACE) 100 MG capsule Take 100 mg by mouth daily as needed for mild constipation.   08/05/2021   escitalopram (LEXAPRO) 20 MG tablet TAKE 1 TABLET(20 MG) BY MOUTH DAILY 90 tablet 0 08/05/2021   ezetimibe (ZETIA) 10 MG tablet TAKE 1 TABLET BY MOUTH EVERY DAY 90 tablet 3 08/05/2021   ferrous sulfate 325 (65 FE) MG tablet Take 325 mg by mouth daily with breakfast.   08/05/2021   furosemide (LASIX) 40 MG tablet Take 1 tablet (40 mg total) by mouth daily. 90 tablet 3 08/05/2021   lisinopril (ZESTRIL) 2.5 MG tablet TAKE 1 TABLET(2.5 MG) BY MOUTH DAILY. 90 tablet 3 08/05/2021   meclizine (ANTIVERT) 25 MG tablet Take 1 tablet (25 mg total) by mouth 3 (three) times daily as needed for dizziness. 30 tablet 0 unknown   metFORMIN (GLUCOPHAGE) 500 MG tablet TAKE ONE TABLET (500  MG) BY MOUTH TWICE DAILY WITH A MEAL. 180 tablet 0 08/05/2021   metoprolol tartrate (LOPRESSOR) 100 MG tablet Take 1 tablet (100 mg total) by mouth 2 (two) times daily. 180 tablet 0 08/05/2021 at 01200   nitroGLYCERIN (NITROSTAT) 0.4 MG SL tablet Place 1 tablet (0.4 mg total) under the tongue every 5 (five) minutes as needed. 25 tablet 3 unknown   omeprazole (PRILOSEC) 40 MG capsule Take 1 capsule by mouth once daily 30 minutes before breakfast 90 capsule 3 08/05/2021   ondansetron (ZOFRAN) 4 MG tablet Take 1 tablet (4 mg total) by mouth every 8 (eight) hours as needed for nausea or vomiting. 30 tablet 1 unknown   potassium chloride SA (KLOR-CON) 20 MEQ tablet Take 1 tablet (20 mEq total) by mouth daily. 90 tablet 3 08/05/2021   rOPINIRole (REQUIP) 3 MG  tablet TAKE 1 TABLET BY MOUTH EVERY NIGHT AT BEDTIME 450 tablet 0 08/05/2021   sildenafil (VIAGRA) 100 MG tablet TAKE 1 TABLET BY MOUTH 30 MINUTES BEFORE ACTIVITY 8 tablet 0 unknown   sucralfate (CARAFATE) 1 g tablet Take one tablet po BID PRN (Patient taking differently: Take 1 g by mouth 2 (two) times daily as needed (acid reflux).) 42 tablet 5 08/05/2021   traMADol (ULTRAM) 50 MG tablet Take 1 tablet (50 mg total) by mouth twice daily as needed for moderate pain 60 tablet 4 08/05/2021   traZODone (DESYREL) 50 MG tablet TAKE 2 TABLETS (100 MG) BY MOUTH AT BEDTIME 180 tablet 1 08/05/2021   pseudoephedrine (SUDAFED) 30 MG tablet Take 30 mg by mouth every 6 (six) hours as needed for congestion. (Patient not taking: Reported on 08/06/2021)   Not Taking    Assessment: Pharmacy consulted to dose lovenox in patient with atrial fibrillation.  Patient is on apixaban prior to admission with last dose given 12/12 0944 and has been transitioned to LMWH.  Scr stable with CrCl > 30. CBC stable, no issues with bleeding per RN.  Goal of Therapy:   Monitor platelets by anticoagulation protocol: Yes   Plan:  Continue lovenox 95 mg subq every 12 hours Monitor H&H and platelets. Follow-up ability to transition back to [place] as appropriate based on clinical course  Cathrine Muster, PharmD PGY2 Cardiology Pharmacy Resident Phone: [phone] 08/15/2021  7:36 AM  Please check [email or web address] for unit-specific pharmacy phone numbers.

## 2021-08-15 NOTE — Progress Notes (Signed)
NAME:  Shaun [name], MRN:  [id number], DOB:  Aug 27, 1956, LOS: 9 ADMISSION DATE:  08/06/2021, CONSULTATION DATE:  12/12 REFERRING MD:  Richarda Blade, CHIEF COMPLAINT:  resp distress    History of Present Illness:     Shaun [name] is a 65 y/o active smoker with normal PFTs 06/2019 with history of anxiety, ASCVD, CHF,  diabetes mellitus type 2, hyperlipidemia, hypertension, obstructive sleep apnea, CAF and more presented to ED with chief complaint of dyspnea.  Patient reports he has had fever and dyspnea for 1 week PTA. He reports that he was diagnosed with the flu 6 days PTA.  He was given a steroid and Tessalon Perles per his report.   He had a T-max at home of 102.    Patient does admit to orthopnea but no increase in peripheral edema.  He has a cough but is only occasionally productive with green sputum.  No hemoptysis.  He does have some chest tightness that is been going on for the whole week.  It is worse with exertion, better with rest.  Worse lying  flat as well.  Patient reports feeling lightheaded when he has the chest tightness.    He does not wear oxygen at home.    He does sleep with a CPAP at home.  Patient does report decreased p.o. intake.  Yesterday all he had all day was chicken soup.  Last bowel movement was yesterday.      The ED Temp 99.7, heart rate 101-125, respiratory rate 16-25, blood pressure 125/88, satting 96% on 3 L nasal cannula  Was improving but rapidly worse sob / desats 12/12 requiring bipap and then intubation by anesthesia    Pertinent  Medical History  CAF - no recent echo, lat one 03/2019  Mod LAE with elevated LVEDP HBP DM OSA on CPAP   Significant Hospital Events: Including procedures, antibiotic start and stop dates in addition to other pertinent events   Resp viral panel 12/9 >  POS FLU - tamiflu  rx 12/10 - 12/14  Oral ET  12/12  L IJ  CVL 12/12 Placed on Amiodarone drip 12/12>>> MRSA screen 12/12   POS BC x 2 12/12 >>> Maxepime per [facility] 12/12  >>> Trach sample  12/13 mixed orgs, ambundant wbc>>> nl flora 12/13 Echo Mod lvh, mild LAE 12/14 added mucomyst for LLL Atx> improved 12/15 - d/c 12/16  12[facility]   Interim History / Subjective:  Agitated this morning during bathing.   Objective   Blood pressure 122/75, pulse 87, temperature 98.9 F (37.2 C), temperature source Oral, resp. rate (!) 24, height 5\' 8"  (1.727 m), weight 95 kg, SpO2 93 %.    Vent Mode: PRVC FiO2 (%):  [40 %-50 %] 40 % Set Rate:  [24 bmp] 24 bmp Vt Set:  [540 mL] 540 mL PEEP:  [10 cmH20] 10 cmH20 Plateau Pressure:  [13 cmH20-21 cmH20] 13 cmH20   Intake/Output Summary (Last 24 hours) at 08/15/2021 0847 Last data filed at 08/15/2021 0700 Gross per 24 hour  Intake 6549.23 ml  Output 2575 ml  Net 3974.23 ml    Filed Weights   08/13/21 1846 08/14/21 0423 08/15/21 0441  Weight: 95.4 kg 95 kg 95 kg     Examination: General appearance:  critically ill appearing man lying in bed in NAD HEENT: [place]/AT, eyes anicteric Neck LIJ CVC Resp: Pplat 14, synchronous with MV, minimal ETT secretions, faint rhales bilaterally Cardio: S1S2, RRR Abd: soft, NT Extremities UE>LE edema, no  cyanosis Neuro: RASS -3, follows simple commands, not nodding to answer questions today  I/O +3.8L  BUN 59 Cr 1.02 WBC 16 H/H 11.1/35.8 Platelets 148 Bgs 200-300s  Assessment & Plan:  Acute respiratory failure with hypoxia in pt with Influenza A. Concern for post-flu bacterial pneumonia, possibly acute pulmonary edema. -LTVV, 4-8cc/kg IBW with goal Pplat<30 and DP<15 -VAP prevention protocol -PAD protocol- con't propofol and fent -wean PEEP & FIO2 per ARDS ladder; no room to titrate down today with saturations. -wean steroids -lasix BID; goal net neg fluid balance -needs to quit smoking -complete 7 days cefepime  Chronic Afib - controlled on Amiodarone IV since 12/12 -con't amiodarone infusion -lovenox BID -holding PTA apixaban, metoprolol  Shock  resolved, off neo since am 12/14  -can resume norepi PRN if needed with propofol  AKI, improved -renally dose meds, avoid nephrotoxic meds -strict I/Os -con't to monitor  Hyperglycemia, remains uncontrolled -con't levemir 20 units BID -con't SSI PRN -increase TF coverage to 5 units Q4h with hold parameters -goal BG 140-180  Hyperkalemia, resolved -lasix bID -additional dose of lokelma today -increase insulin again today -con't to monitor  Acute anemia -transfuse for Hb<7 or hemodynamically significant bleeding  Thrombocytopenia, potentially due to antibiotics- stable -con't to monitor  Tobacco abuse -I have previously discussed with wife how important it will be to quit smoking; can counsel him when more appropriate  Ms. Adamek was updated at bedside.  Best Practice (right click and "Reselect all SmartList Selections" daily)   Diet/type: tube feeding  DVT prophylaxis: LMWH GI prophylaxis: PPI Lines: Central line Foley:  Yes, and it is still needed Code Status:  full code Last date of multidisciplinary goals of care discussion  12/18   Labs   CBC: Recent Labs  Lab 08/09/21 1623 08/10/21 0350 08/11/21 0356 08/12/21 0510 08/14/21 0258 08/15/21 0351  WBC 16.1* 15.2* 17.4* 21.6* 15.7* 16.0*  NEUTROABS 13.5*  --   --   --   --   --   HGB 14.3 12.6* 12.1* 12.0* 10.9* 11.1*  HCT 43.3 37.9* 37.0* 37.5* 34.6* 35.8*  MCV 86.6 86.1 87.5 88.4 88.0 89.5  PLT 195 197 182 167 146* 148*     Basic Metabolic Panel: Recent Labs  Lab 08/10/21 0350 08/11/21 0356 08/12/21 0510 08/14/21 0258 08/15/21 0351  NA 131* 134* 137 137 140  K 3.9 4.2 4.4 5.5* 5.1  CL 99 100 103 106 106  CO2 24 25 25 28 28   GLUCOSE 239* 208* 286* 318* 326*  BUN 29* 40* 62* 57* 59*  CREATININE 0.92 0.97 1.32* 1.08 1.02  CALCIUM 7.9* 7.9* 8.1* 8.0* 8.4*  MG 2.4 2.4  --  2.7*  --   PHOS 3.1 3.8  --  3.3  --     GFR: Estimated Creatinine Clearance: 80.7 mL/min (by C-G formula based on  SCr of 1.02 mg/dL). Recent Labs  Lab 08/09/21 1623 08/09/21 1833 08/10/21 0350 08/10/21 0753 08/11/21 0356 08/12/21 0510 08/14/21 0258 08/15/21 0351  PROCALCITON <0.10  --   --   --   --   --   --   --   WBC 16.1*  --    < >  --  17.4* 21.6* 15.7* 16.0*  LATICACIDVEN 2.9* 2.4*  --  1.4  --   --   --   --    < > = values in this interval not displayed.     This patient is critically ill with multiple organ system failure which requires  frequent high complexity decision making, assessment, support, evaluation, and titration of therapies. This was completed through the application of advanced monitoring technologies and extensive interpretation of multiple databases. During this encounter critical care time was devoted to patient care services described in this note for 39 minutes.  Julian Hy, DO 08/15/21 9:20 AM [facility]

## 2021-08-16 ENCOUNTER — Ambulatory Visit: Admit: 2021-08-16 | Payer: Medicare HMO | Admitting: Surgery

## 2021-08-16 DIAGNOSIS — J09X1 Influenza due to identified novel influenza A virus with pneumonia: Secondary | ICD-10-CM

## 2021-08-16 LAB — BASIC METABOLIC PANEL
Anion gap: 8 (ref 5–15)
Anion gap: 7 (ref 5–15)
BUN: 65 mg/dL — ABNORMAL HIGH (ref 8–23)
BUN: 60 mg/dL — ABNORMAL HIGH (ref 8–23)
CO2: 28 mmol/L (ref 22–32)
CO2: 29 mmol/L (ref 22–32)
Calcium: 8.2 mg/dL — ABNORMAL LOW (ref 8.9–10.3)
Calcium: 8.2 mg/dL — ABNORMAL LOW (ref 8.9–10.3)
Chloride: 102 mmol/L (ref 98–111)
Chloride: 105 mmol/L (ref 98–111)
Creatinine, Ser: 1.08 mg/dL (ref 0.61–1.24)
Creatinine, Ser: 1.07 mg/dL (ref 0.61–1.24)
GFR, Estimated: >60 mL/min (ref >60)
GFR, Estimated: >60 mL/min (ref >60)
Glucose, Bld: 241 mg/dL — ABNORMAL HIGH (ref 70–99)
Glucose, Bld: 351 mg/dL — ABNORMAL HIGH (ref 70–99)
Potassium: 4.5 mmol/L (ref 3.5–5.1)
Potassium: 5.2 mmol/L — ABNORMAL HIGH (ref 3.5–5.1)
Sodium: 138 mmol/L (ref 135–145)
Sodium: 141 mmol/L (ref 135–145)

## 2021-08-16 LAB — GLUCOSE, CAPILLARY
Glucose-Capillary: 215 mg/dL — ABNORMAL HIGH (ref 70–99)
Glucose-Capillary: 223 mg/dL — ABNORMAL HIGH (ref 70–99)
Glucose-Capillary: 231 mg/dL — ABNORMAL HIGH (ref 70–99)
Glucose-Capillary: 266 mg/dL — ABNORMAL HIGH (ref 70–99)
Glucose-Capillary: 307 mg/dL — ABNORMAL HIGH (ref 70–99)
Glucose-Capillary: 371 mg/dL — ABNORMAL HIGH (ref 70–99)

## 2021-08-16 LAB — CBC
HCT: 35.7 % — ABNORMAL LOW (ref 39.0–52.0)
Hemoglobin: 11.2 g/dL — ABNORMAL LOW (ref 13.0–17.0)
MCH: 27.9 pg (ref 26.0–34.0)
MCHC: 31.4 g/dL (ref 30.0–36.0)
MCV: 89 fL (ref 80.0–100.0)
Platelets: 134 10*3/uL — ABNORMAL LOW (ref 150–400)
RBC: 4.01 MIL/uL — ABNORMAL LOW (ref 4.22–5.81)
RDW: 14.4 % (ref 11.5–15.5)
WBC: 14 10*3/uL — ABNORMAL HIGH (ref 4.0–10.5)
nRBC: 0 % (ref 0.0–0.2)

## 2021-08-16 SURGERY — REPAIR, HERNIA, VENTRAL
Anesthesia: General

## 2021-08-16 MED ORDER — FUROSEMIDE 10 MG/ML IJ SOLN
40.0000 mg | Freq: Three times a day (TID) | INTRAMUSCULAR | Status: AC
  Administered 2021-08-16 – 2021-08-17 (×3): 40 mg via INTRAVENOUS
  Filled 2021-08-16 (×3): qty 4

## 2021-08-16 MED ORDER — SODIUM ZIRCONIUM CYCLOSILICATE 10 G PO PACK
10.0000 g | PACK | Freq: Once | ORAL | Status: AC
  Administered 2021-08-16: 19:00:00 10 g via ORAL
  Filled 2021-08-16: qty 1

## 2021-08-16 MED ORDER — INSULIN ASPART 100 UNIT/ML IJ SOLN
7.0000 [IU] | INTRAMUSCULAR | Status: DC
Start: 2021-08-16 — End: 2021-08-19
  Administered 2021-08-16 – 2021-08-19 (×18): 7 [IU] via SUBCUTANEOUS

## 2021-08-16 NOTE — Progress Notes (Signed)
FMLA paperwork for wife completed and returned to her.  Julian Hy, DO 08/16/21 3:49 PM [facility]

## 2021-08-16 NOTE — Progress Notes (Signed)
Progress Note  Patient Name: Shaun [name] Date of Encounter: 08/16/2021  [facility] Cardiologist: Kirk Ruths, MD   Subjective   Intubated and sedated  Inpatient Medications    Scheduled Meds:  ARIPiprazole  2 mg Per Tube Daily   atorvastatin  80 mg Per Tube Daily   chlorhexidine gluconate (MEDLINE KIT)  15 mL Mouth Rinse BID   Chlorhexidine Gluconate Cloth  6 each Topical Daily   docusate  100 mg Per Tube BID   enoxaparin (LOVENOX) injection  95 mg Subcutaneous Q12H   escitalopram  20 mg Per Tube Daily   feeding supplement (PROSource TF)  45 mL Per Tube BID   free water  100 mL Per Tube Q4H   furosemide  40 mg Intravenous Q12H   insulin aspart  0-20 Units Subcutaneous Q4H   insulin aspart  5 Units Subcutaneous Q4H   insulin detemir  20 Units Subcutaneous BID   levalbuterol  1.25 mg Nebulization QID   mouth rinse  15 mL Mouth Rinse 10 times per day   methylPREDNISolone (SOLU-MEDROL) injection  40 mg Intravenous Daily   mupirocin ointment   Nasal BID   pantoprazole (PROTONIX) IV  40 mg Intravenous Q24H   sodium chloride flush  10-40 mL Intracatheter Q12H   Continuous Infusions:  sodium chloride 10 mL/hr at 08/13/21 1848   amiodarone 30 mg/hr (08/16/21 0600)   ceFEPime (MAXIPIME) IV Stopped (08/16/21 0103)   feeding supplement (VITAL AF 1.2 CAL) 1,000 mL (08/15/21 2000)   fentaNYL infusion INTRAVENOUS 75 mcg/hr (08/16/21 0600)   propofol (DIPRIVAN) infusion 30 mcg/kg/min (08/16/21 0600)   PRN Meds: sodium chloride, acetaminophen **OR** acetaminophen, bisacodyl, ondansetron **OR** ondansetron (ZOFRAN) IV, sodium chloride flush   Vital Signs    Vitals:   08/16/21 0400 08/16/21 0500 08/16/21 0600 08/16/21 0700  BP: 96/61 98/61 106/66 127/76  Pulse: (!) 103 96 100 (!) 111  Resp: (!) 24 (!) 24 (!) 22 (!) 24  Temp:      TempSrc:      SpO2: 91% 92% 92% 92%  Weight:      Height:        Intake/Output Summary (Last 24 hours) at 08/16/2021 0728 Last  data filed at 08/16/2021 0600 Gross per 24 hour  Intake 2954.34 ml  Output 4275 ml  Net -1320.66 ml   Last 3 Weights 08/16/2021 08/15/2021 08/14/2021  Weight (lbs) 209 lb 3.5 oz 209 lb 7 oz 209 lb 7 oz  Weight (kg) 94.9 kg 95 kg 95 kg      Telemetry    Atrial fibrillation, rate elevated - Personally Reviewed  Physical Exam   GEN: Intubated and sedated Neck: No JVD Cardiac: irregular Respiratory: Clear to auscultation bilaterally anteriorly GI: Soft, nontender, non-distended  MS: No edema Neuro:  Not assessed as pt sedated Psych: Not assessed as pt sedated  Labs    High Sensitivity Troponin:   Recent Labs  Lab 08/06/21 2140 08/06/21 2348 08/10/21 0350  TROPONINIHS 4 4 4      Chemistry Recent Labs  Lab 08/09/21 1623 08/10/21 0350 08/11/21 0356 08/12/21 0510 08/14/21 0258 08/15/21 0351 08/16/21 0316  NA 131* 131* 134*   < > 137 140 138  K 5.0 3.9 4.2   < > 5.5* 5.1 4.5  CL 98 99 100   < > 106 106 102  CO2 24 24 25    < > 28 28 28   GLUCOSE 223* 239* 208*   < > 318* 326* 241*  BUN 28*  29* 40*   < > 57* 59* 65*  CREATININE 1.06 0.92 0.97   < > 1.08 1.02 1.08  CALCIUM 8.1* 7.9* 7.9*   < > 8.0* 8.4* 8.2*  MG  --  2.4 2.4  --  2.7*  --   --   PROT 7.0  --  5.7*  --   --   --   --   ALBUMIN 3.3*  --  2.5*  --   --   --   --   AST 28  --  15  --   --   --   --   ALT 47*  --  31  --   --   --   --   ALKPHOS 101  --  75  --   --   --   --   BILITOT 2.1*  --  0.7  --   --   --   --   GFRNONAA >60 >60 >60   < > >60 >60 >60  ANIONGAP 9 8 9    < > 3* 6 8   < > = values in this interval not displayed.    Lipids  Recent Labs  Lab 08/15/21 0351  TRIG 182*    Hematology Recent Labs  Lab 08/14/21 0258 08/15/21 0351 08/16/21 0316  WBC 15.7* 16.0* 14.0*  RBC 3.93* 4.00* 4.01*  HGB 10.9* 11.1* 11.2*  HCT 34.6* 35.8* 35.7*  MCV 88.0 89.5 89.0  MCH 27.7 27.8 27.9  MCHC 31.5 31.0 31.4  RDW 14.4 14.6 14.4  PLT 146* 148* 134*     Patient Profile     65 y.o.  male CAD s/p DES to Lcx (2016), permanent Afib, COPD, DMII, HTN, HHLD, OSA who [facility] with dyspnea found to have Influenza A. Course complicated by acute hypoxic respiratory failure requiring intubation and Afib with RVR for which Cardiology is consulted.  Echocardiogram this admission shows normal LV function, mild left atrial enlargement.  Assessment & Plan    1 permanent atrial fibrillation-heart rate is reasonably well controlled.  We will continue IV amiodarone for now.  Can transition to metoprolol later as blood pressure continues to improve.  Continue Lovenox.  Will transition to apixaban prior to discharge.  Note LV function normal on echocardiogram.  2 VDRF/influenza A/pneumonia-continue antibiotics and ventilator management per critical care medicine.  3 coronary artery disease-continue statin.  For questions or updates, please contact [place] Please consult [email or web address] for contact info under        Signed, Kirk Ruths, MD  08/16/2021, 7:28 AM

## 2021-08-16 NOTE — Progress Notes (Signed)
NAME:  Shaun [name], MRN:  [id number], DOB:  1956/02/15, LOS: 66 ADMISSION DATE:  08/06/2021, CONSULTATION DATE:  12/12 REFERRING MD:  Richarda Blade, CHIEF COMPLAINT:  resp distress    History of Present Illness:     Shaun [name] is a 65 y/o active smoker with normal PFTs 06/2019 with history of anxiety, ASCVD, CHF,  diabetes mellitus type 2, hyperlipidemia, hypertension, obstructive sleep apnea, CAF and more presented to ED with chief complaint of dyspnea.  Patient reports he has had fever and dyspnea for 1 week PTA. He reports that he was diagnosed with the flu 6 days PTA.  He was given a steroid and Tessalon Perles per his report.   He had a T-max at home of 102.    Patient does admit to orthopnea but no increase in peripheral edema.  He has a cough but is only occasionally productive with green sputum.  No hemoptysis.  He does have some chest tightness that is been going on for the whole week.  It is worse with exertion, better with rest.  Worse lying  flat as well.  Patient reports feeling lightheaded when he has the chest tightness.    He does not wear oxygen at home.    He does sleep with a CPAP at home.  Patient does report decreased p.o. intake.  Yesterday all he had all day was chicken soup.  Last bowel movement was yesterday.      The ED Temp 99.7, heart rate 101-125, respiratory rate 16-25, blood pressure 125/88, satting 96% on 3 L nasal cannula  Was improving but rapidly worse sob / desats 12/12 requiring bipap and then intubation by anesthesia    Pertinent  Medical History  CAF - no recent echo, lat one 03/2019  Mod LAE with elevated LVEDP HBP DM OSA on CPAP   Significant Hospital Events: Including procedures, antibiotic start and stop dates in addition to other pertinent events   Resp viral panel 12/9 >  POS FLU - tamiflu  rx 12/10 - 12/14  Oral ET  12/12  L IJ  CVL 12/12 Placed on Amiodarone drip 12/12>>> MRSA screen 12/12   POS BC x 2 12/12 >>> Maxepime per [facility]  12/12 >>> Trach sample  12/13 mixed orgs, ambundant wbc>>> nl flora 12/13 Echo Mod lvh, mild LAE 12/14 added mucomyst for LLL Atx> improved 12/15 - d/c 12/16  12/17 transfer to [facility]   Interim History / Subjective:  Shaun [name] remains on the vent, increased FiO2.  Objective   Blood pressure 127/76, pulse 99, temperature 98.9 F (37.2 C), temperature source Axillary, resp. rate (!) 24, height 5\' 8"  (1.727 m), weight 94.9 kg, SpO2 92 %.    Vent Mode: PRVC FiO2 (%):  [40 %-45 %] 45 % Set Rate:  [24 bmp] 24 bmp Vt Set:  [540 mL] 540 mL PEEP:  [10 cmH20] 10 cmH20 Plateau Pressure:  [16 cmH20-19 cmH20] 18 cmH20   Intake/Output Summary (Last 24 hours) at 08/16/2021 0915 Last data filed at 08/16/2021 0600 Gross per 24 hour  Intake 2742.59 ml  Output 4075 ml  Net -1332.41 ml    Filed Weights   08/14/21 0423 08/15/21 0441 08/16/21 0320  Weight: 95 kg 95 kg 94.9 kg     Examination: General appearance:  ill appearing man intubated, sedated HEENT: [place]/AT, eyes anicteric, ETT in place Neck LIJ CVC, mild bruising, no erythema. Resp: Pplat 18, DP 8, minimal secretions, no wheezing or rhonchi, bilateral faint rhales Cardio: s1S2, RRR  Abd: soft, NT Extremities persistent edema, slightly improved, no cyanosis Neuro: RASS -4, opens eyes to tracheal suctioning, not following commands  I/O -1.3L Net +2.5L for admission  BUN 65 Cr 1.08 WBC 14 H/H 11.2/35.7 Platelets 134 Bgs 200s  Assessment & Plan:  Acute respiratory failure with hypoxia in pt with Influenza A. Concern for post-flu bacterial pneumonia, possibly acute pulmonary edema. -LTVV -titrate PEEP & FiO2 per ARDS protocol -PAD protocol for sedation -VAP prevention protocol -daily SAT & SBT once improving from oxygenation standpoint -con't steroids daily; con't to deescalate over next few days -needs to quit smoking -complete 7 day course of cefepime -lasix Q8h -CXR tomorrow morning & BNP level  Chronic Afib -  controlled on Amiodarone IV since 12/12 -con't IV amiodarone -con't lovenox twice daily -holding PTA apixaban, metoprolol  Shock- resolved, off neo since am 12/14   AKI, improved -renally dose meds, avoid nephrotoxic meds -strict I/Os -monitor -con't foley  Hyperglycemia, remains uncontrolled -con't levemir 20 units BID -con't SSI PRN -increase TF coverage to 7 units Q4h with hold parameters -goal BG 140-180 -decreasing steroids  Hyperkalemia, resolved -con't to monitor -received lokelma the past 2 days; can hold today -con't lasix  Acute anemia due to critical illness -transfuse for Hb<7 or hemodynamically significant bleeding  Thrombocytopenia, potentially due to antibiotics- stable -monitor  Tobacco abuse -I have previously discussed with wife how important it will be to quit smoking; can counsel him when more appropriate   Wife updated at bedside today.  Best Practice (right click and "Reselect all SmartList Selections" daily)   Diet/type: tube feeding  DVT prophylaxis: LMWH GI prophylaxis: PPI Lines: Central line Foley:  Yes, and it is still needed Code Status:  full code Last date of multidisciplinary goals of care discussion  12/18   Labs   CBC: Recent Labs  Lab 08/09/21 1623 08/10/21 0350 08/11/21 0356 08/12/21 0510 08/14/21 0258 08/15/21 0351 08/16/21 0316  WBC 16.1*   < > 17.4* 21.6* 15.7* 16.0* 14.0*  NEUTROABS 13.5*  --   --   --   --   --   --   HGB 14.3   < > 12.1* 12.0* 10.9* 11.1* 11.2*  HCT 43.3   < > 37.0* 37.5* 34.6* 35.8* 35.7*  MCV 86.6   < > 87.5 88.4 88.0 89.5 89.0  PLT 195   < > 182 167 146* 148* 134*   < > = values in this interval not displayed.     Basic Metabolic Panel: Recent Labs  Lab 08/10/21 0350 08/11/21 0356 08/12/21 0510 08/14/21 0258 08/15/21 0351 08/16/21 0316  NA 131* 134* 137 137 140 138  K 3.9 4.2 4.4 5.5* 5.1 4.5  CL 99 100 103 106 106 102  CO2 24 25 25 28 28 28   GLUCOSE 239* 208* 286* 318* 326*  241*  BUN 29* 40* 62* 57* 59* 65*  CREATININE 0.92 0.97 1.32* 1.08 1.02 1.08  CALCIUM 7.9* 7.9* 8.1* 8.0* 8.4* 8.2*  MG 2.4 2.4  --  2.7*  --   --   PHOS 3.1 3.8  --  3.3  --   --     GFR: Estimated Creatinine Clearance: 76.2 mL/min (by C-G formula based on SCr of 1.08 mg/dL). Recent Labs  Lab 08/09/21 1623 08/09/21 1833 08/10/21 0350 08/10/21 0753 08/11/21 0356 08/12/21 0510 08/14/21 0258 08/15/21 0351 08/16/21 0316  PROCALCITON <0.10  --   --   --   --   --   --   --   --  WBC 16.1*  --    < >  --    < > 21.6* 15.7* 16.0* 14.0*  LATICACIDVEN 2.9* 2.4*  --  1.4  --   --   --   --   --    < > = values in this interval not displayed.     This patient is critically ill with multiple organ system failure which requires frequent high complexity decision making, assessment, support, evaluation, and titration of therapies. This was completed through the application of advanced monitoring technologies and extensive interpretation of multiple databases. During this encounter critical care time was devoted to patient care services described in this note for 41 minutes.  Julian Hy, DO 08/16/21 1:09 PM [facility]

## 2021-08-17 ENCOUNTER — Inpatient Hospital Stay (HOSPITAL_COMMUNITY): Payer: 59

## 2021-08-17 LAB — BRAIN NATRIURETIC PEPTIDE: B Natriuretic Peptide: 26.2 pg/mL (ref 0.0–100.0)

## 2021-08-17 LAB — GLUCOSE, CAPILLARY
Glucose-Capillary: 167 mg/dL — ABNORMAL HIGH (ref 70–99)
Glucose-Capillary: 178 mg/dL — ABNORMAL HIGH (ref 70–99)
Glucose-Capillary: 240 mg/dL — ABNORMAL HIGH (ref 70–99)
Glucose-Capillary: 256 mg/dL — ABNORMAL HIGH (ref 70–99)
Glucose-Capillary: 276 mg/dL — ABNORMAL HIGH (ref 70–99)
Glucose-Capillary: 294 mg/dL — ABNORMAL HIGH (ref 70–99)

## 2021-08-17 LAB — CBC
HCT: 35 % — ABNORMAL LOW (ref 39.0–52.0)
Hemoglobin: 11.3 g/dL — ABNORMAL LOW (ref 13.0–17.0)
MCH: 28.5 pg (ref 26.0–34.0)
MCHC: 32.3 g/dL (ref 30.0–36.0)
MCV: 88.2 fL (ref 80.0–100.0)
Platelets: 116 10*3/uL — ABNORMAL LOW (ref 150–400)
RBC: 3.97 MIL/uL — ABNORMAL LOW (ref 4.22–5.81)
RDW: 14.2 % (ref 11.5–15.5)
WBC: 13.6 10*3/uL — ABNORMAL HIGH (ref 4.0–10.5)
nRBC: 0 % (ref 0.0–0.2)

## 2021-08-17 LAB — MAGNESIUM: Magnesium: 2.5 mg/dL — ABNORMAL HIGH (ref 1.7–2.4)

## 2021-08-17 LAB — BASIC METABOLIC PANEL
Anion gap: 7 (ref 5–15)
BUN: 66 mg/dL — ABNORMAL HIGH (ref 8–23)
CO2: 32 mmol/L (ref 22–32)
Calcium: 8.1 mg/dL — ABNORMAL LOW (ref 8.9–10.3)
Chloride: 104 mmol/L (ref 98–111)
Creatinine, Ser: 1.06 mg/dL (ref 0.61–1.24)
GFR, Estimated: >60 mL/min (ref >60)
Glucose, Bld: 168 mg/dL — ABNORMAL HIGH (ref 70–99)
Potassium: 4.4 mmol/L (ref 3.5–5.1)
Sodium: 143 mmol/L (ref 135–145)

## 2021-08-17 MED ORDER — FUROSEMIDE 10 MG/ML IJ SOLN
40.0000 mg | Freq: Once | INTRAMUSCULAR | Status: AC
  Administered 2021-08-17: 19:00:00 40 mg via INTRAVENOUS
  Filled 2021-08-17: qty 4

## 2021-08-17 NOTE — Progress Notes (Signed)
NAME:  Shaun [name], MRN:  [id number], DOB:  [birth year], LOS: 43 ADMISSION DATE:  08/06/2021, CONSULTATION DATE:  12/12 REFERRING MD:  Richarda Blade, CHIEF COMPLAINT:  resp distress    History of Present Illness:     Shaun [name] is a 65 y/o active smoker with normal PFTs 06/2019 with history of anxiety, ASCVD, CHF,  diabetes mellitus type 2, hyperlipidemia, hypertension, obstructive sleep apnea, CAF and more presented to ED with chief complaint of dyspnea.  Patient reports he has had fever and dyspnea for 1 week PTA. He reports that he was diagnosed with the flu 6 days PTA.  He was given a steroid and Tessalon Perles per his report.   He had a T-max at home of 102.    Patient does admit to orthopnea but no increase in peripheral edema.  He has a cough but is only occasionally productive with green sputum.  No hemoptysis.  He does have some chest tightness that is been going on for the whole week.  It is worse with exertion, better with rest.  Worse lying  flat as well.  Patient reports feeling lightheaded when he has the chest tightness.    He does not wear oxygen at home.    He does sleep with a CPAP at home.  Patient does report decreased p.o. intake.  Yesterday all he had all day was chicken soup.  Last bowel movement was yesterday.      The ED Temp 99.7, heart rate 101-125, respiratory rate 16-25, blood pressure 125/88, satting 96% on 3 L nasal cannula  Was improving but rapidly worse sob / desats 12/12 requiring bipap and then intubation by anesthesia    Pertinent  Medical History  CAF - no recent echo, lat one 03/2019  Mod LAE with elevated LVEDP HBP DM OSA on CPAP   Significant Hospital Events: Including procedures, antibiotic start and stop dates in addition to other pertinent events   Resp viral panel 12/9 >  POS FLU - tamiflu  rx 12/10 - 12/14  Oral ET  12/12  L IJ  CVL 12/12 Placed on Amiodarone drip 12/12>>> MRSA screen 12/12   POS BC x 2 12/12 >>> Maxepime per [facility]  12/12 >>> Trach sample  12/13 mixed orgs, ambundant wbc>>> nl flora 12/13 Echo Mod lvh, mild LAE 12/14 added mucomyst for LLL Atx> improved 12/15 - d/c 12/16  12/17 transfer to [facility] 12/20 trial PEEP reduction and SAT today   Interim History / Subjective:  Able to decrease FiO2 to 40% PEEP remains at 10 CXR with continued patchy infiltrates  Objective   Blood pressure 111/77, pulse 97, temperature 99 F (37.2 C), resp. rate (!) 24, height 5\' 8"  (1.727 m), weight 94.3 kg, SpO2 93 %.    Vent Mode: PRVC FiO2 (%):  [40 %] 40 % Set Rate:  [24 bmp] 24 bmp Vt Set:  [540 mL] 540 mL PEEP:  [10 cmH20] 10 cmH20 Plateau Pressure:  [18 cmH20-20 cmH20] 18 cmH20   Intake/Output Summary (Last 24 hours) at 08/17/2021 0817 Last data filed at 08/17/2021 0600 Gross per 24 hour  Intake 2185.09 ml  Output 3775 ml  Net -1589.91 ml    Filed Weights   08/15/21 0441 08/16/21 0320 08/17/21 0558  Weight: 95 kg 94.9 kg 94.3 kg     General:  critically ill-appearing M intubated and sedated HEENT: MM pink/moist, sclera anicteric, pupils equal Neuro: examined on propofol and fentanyl, RASS -3 CV: s1s2 irregular, no m/r/g PULM:  mechanical vent sounds bilaterally, no rhonchi or wheezing and synchronous with vent GI: soft, bsx4 active  Extremities: warm/dry, no edema or cyanosis   Skin: no rashes or lesions    I/O -1.6L Net +952Lcc for admission  BUN 66 Cr 1.06 WBC 13 H/H 11.2/35.7 Platelets 116 Bgs 168  Assessment & Plan:  Acute respiratory failure with hypoxia in pt with Influenza A. Concern for post-flu bacterial pneumonia, possibly acute pulmonary edema. Diuresing well Echo with preserved EF  -attempt SAT and reducing PEEP gradually from 10 to 5 today, if tolerates then attempt weaning tomorrow  -LTVV -titrate PEEP & FiO2 per ARDS protocol -PAD protocol for sedation -VAP prevention protocol -daily SAT & SBT as is improving from oxygenation standpoint -d/c steroids today -needs to  quit smoking -Cefepime course completed  -was receiving lasix q8hrs and has diuresed well. Will see how he weans today, may need further diuresis   Chronic Afib -  controlled on Amiodarone IV since 12/12 -con't IV amiodarone -con't lovenox twice daily -continue holding PTA apixaban, metoprolol  Shock- resolved -remains stable off pressors   AKI, improved Creatinine 1.06, BUN 66 -continue to monitor  -renally dose meds, avoid nephrotoxic meds -strict I/Os -monitor -con't foley  Hyperglycemia,  remains poorly uncontrolled -hopefully will improve with stopping steroids  -con't levemir 20 units BID -con't SSI PRN -increase TF coverage to 7 units Q4h with hold parameters -goal BG 140-180   Hyperkalemia, resolved Improved today -continue to monitor  Acute anemia due to critical illness -transfuse for Hb<7 or hemodynamically significant bleeding  Thrombocytopenia,  Slightly decreased today to 116 -suspect 2/2 critical illness  -monitor  Tobacco abuse -I have previously discussed with wife how important it will be to quit smoking; can counsel him when more appropriate   Wife and daughter updated at bedside today.  Best Practice (right click and "Reselect all SmartList Selections" daily)   Diet/type: tube feeding  DVT prophylaxis: LMWH GI prophylaxis: PPI Lines: Central line Foley:  Yes, and it is still needed Code Status:  full code Last date of multidisciplinary goals of care discussion  12/18   Labs   CBC: Recent Labs  Lab 08/12/21 0510 08/14/21 0258 08/15/21 0351 08/16/21 0316 08/17/21 0432  WBC 21.6* 15.7* 16.0* 14.0* 13.6*  HGB 12.0* 10.9* 11.1* 11.2* 11.3*  HCT 37.5* 34.6* 35.8* 35.7* 35.0*  MCV 88.4 88.0 89.5 89.0 88.2  PLT 167 146* 148* 134* 116*     Basic Metabolic Panel: Recent Labs  Lab 08/11/21 0356 08/12/21 0510 08/14/21 0258 08/15/21 0351 08/16/21 0316 08/16/21 1459 08/17/21 0432  NA 134*   < > 137 140 138 141 143  K 4.2    < > 5.5* 5.1 4.5 5.2* 4.4  CL 100   < > 106 106 102 105 104  CO2 25   < > 28 28 28 29  32  GLUCOSE 208*   < > 318* 326* 241* 351* 168*  BUN 40*   < > 57* 59* 65* 60* 66*  CREATININE 0.97   < > 1.08 1.02 1.08 1.07 1.06  CALCIUM 7.9*   < > 8.0* 8.4* 8.2* 8.2* 8.1*  MG 2.4  --  2.7*  --   --   --  2.5*  PHOS 3.8  --  3.3  --   --   --   --    < > = values in this interval not displayed.    GFR: Estimated Creatinine Clearance: 77.4 mL/min (by C-G formula based on  SCr of 1.06 mg/dL). Recent Labs  Lab 08/14/21 0258 08/15/21 0351 08/16/21 0316 08/17/21 0432  WBC 15.7* 16.0* 14.0* 13.6*    CRITICAL CARE Performed by: Otilio Carpen Tykisha Areola   Total critical care time: 42 minutes  Critical care time was exclusive of separately billable procedures and treating other patients.  Critical care was necessary to treat or prevent imminent or life-threatening deterioration.  Critical care was time spent personally by me on the following activities: development of treatment plan with patient and/or surrogate as well as nursing, discussions with consultants, evaluation of patient's response to treatment, examination of patient, obtaining history from patient or surrogate, ordering and performing treatments and interventions, ordering and review of laboratory studies, ordering and review of radiographic studies, pulse oximetry and re-evaluation of patient's condition.   Otilio Carpen Tabari Volkert, PA-C [facility] See Amion for pager If no response to pager , please call [phone] until 7pm After 7:00 pm call [place]  [phone]

## 2021-08-17 NOTE — TOC Initial Note (Signed)
Transition of Care [facility]) - Initial/Assessment Note    Patient Details  Name: Shaun [name] MRN: [id number] Date of Birth: March 24, 1956  Transition [facility]) CM/SW Contact:    Bethena Roys, RN Phone Number: 08/17/2021, 2:34 PM  Clinical Narrative: Risk for readmission assessment completed. Patient presented for dyspnea. Prior to arrival patient was from home with spouse. Patient uses durable medical equipment (DME) cane at home. Case Manager will continue to follow for disposition and additional DME needs as the patient progresses.           Expected Discharge Plan: [place] Barriers to Discharge: Continued Medical Work up   Patient Goals and CMS Choice Patient states their goals for this hospitalization and ongoing recovery are:: return home   Choice offered to / list presented to : Patient  Expected Discharge Plan and Services Expected Discharge Plan: [place] In-house Referral: NA Discharge Planning Services: CM Consult Post Acute Care Choice: [place] arrangements for the past 2 months: Single Family Home                 DME Arranged: N/A DME Agency: NA      Prior Living Arrangements/Services Living arrangements for the past 2 months: Single Family Home Lives with:: Spouse Patient language and need for interpreter reviewed:: Yes Do you feel safe going back to the place where you live?: Yes      Need for Family Participation in Patient Care: Yes (Comment) Care giver support system in place?: Yes (comment) Current home services: DME (Patient has cane at home.) Criminal Activity/Legal Involvement Pertinent to Current Situation/Hospitalization: No - Comment as needed  Activities of Daily Living Home Assistive Devices/Equipment: Cane (specify quad or straight) ADL Screening (condition at time of admission) Patient's cognitive ability adequate to safely complete daily activities?: Yes Is the patient  deaf or have difficulty hearing?: No Does the patient have difficulty seeing, even when wearing glasses/contacts?: No Does the patient have difficulty concentrating, remembering, or making decisions?: No Patient able to express need for assistance with ADLs?: Yes Does the patient have difficulty dressing or bathing?: No Independently performs ADLs?: Yes (appropriate for developmental age) Does the patient have difficulty walking or climbing stairs?: Yes Weakness of Legs: Both Weakness of Arms/Hands: None   Emotional Assessment   Attitude/Demeanor/Rapport: Unable to Assess Affect (typically observed): Unable to Assess Orientation: : Oriented to Self, Oriented to Place, Oriented to  Time, Oriented to Situation Alcohol / Substance Use: Not Applicable Psych Involvement: No (comment)  Admission diagnosis:  SOB (shortness of breath) [R06.02] Acute respiratory failure with hypoxia (HCC) [J96.01] Influenza [J11.1] Patient Active Problem List   Diagnosis Date Noted   Hypoxia    Endotracheally intubated    Influenza    Acute respiratory failure with hypoxemia ([place]) 08/06/2021   Ventral hernia 12/31/2020   Nausea with vomiting 12/31/2019   Dyslipidemia, goal LDL below 70 06/18/2019   Hx of adenomatous colonic polyps 10/17/2018   Anemia 10/17/2018   Phimosis of penis 09/27/2018   Osteoarthritis of right hip 06/20/2018   Esophageal dysphagia 05/21/2018   GERD (gastroesophageal reflux disease) 05/21/2018   RLS (restless legs syndrome)    PUD (peptic ulcer disease)    Insomnia    Chronic diastolic CHF (congestive heart failure) (HCC)    Asthma    Arteriosclerotic cardiovascular disease (ASCVD)    Chronic anticoagulation 01/27/2017   Depression 09/24/2015   COPD (chronic obstructive pulmonary disease) ([place]) 07/20/2015  Accelerating angina ([place]) 07/19/2015   Genetic testing 09/09/2014   H/O adenomatous polyp of colon 08/11/2014   Non-insulin treated type 2 diabetes mellitus ([place])  02/09/2011   CAD S/P percutaneous coronary angioplasty    Tobacco abuse    Essential hypertension    Chronic atrial fibrillation ([place]) 02/23/2010   Obstructive sleep apnea 12/27/2009   PCP:  Maximiano Coss, NP Pharmacy:   [place] [facility] [place] [address] Phone: [phone] Fax: [place] [phone] - [place], [place] - [address] [place] [address] [place] [address] Phone: [phone] Fax: [phone]   Readmission Risk Interventions Readmission Risk Prevention Plan 08/17/2021 08/09/2021  Transportation Screening Complete Complete  PCP or Specialist Appt within 3-5 Days Complete -  HRI or Home Care Consult Complete Complete  Social Work Consult for [place] Planning/Counseling Complete Complete  Palliative Care Screening Not Applicable Not Applicable  Medication Review Press photographer) Complete Complete  Some recent data might be hidden

## 2021-08-17 NOTE — Progress Notes (Signed)
° °Progress Note ° °Patient Name: Shaun [name] °Date of Encounter: 08/17/2021 ° °[facility] Cardiologist:  , MD  ° °Subjective  ° °Pt remains intubated and sedated ° °Inpatient Medications  °  °Scheduled Meds: ° ARIPiprazole  2 mg Per Tube Daily  ° atorvastatin  80 mg Per Tube Daily  ° chlorhexidine gluconate (MEDLINE KIT)  15 mL Mouth Rinse BID  ° Chlorhexidine Gluconate Cloth  6 each Topical Daily  ° docusate  100 mg Per Tube BID  ° enoxaparin (LOVENOX) injection  95 mg Subcutaneous Q12H  ° escitalopram  20 mg Per Tube Daily  ° feeding supplement (PROSource TF)  45 mL Per Tube BID  ° insulin aspart  0-20 Units Subcutaneous Q4H  ° insulin aspart  7 Units Subcutaneous Q4H  ° insulin detemir  20 Units Subcutaneous BID  ° levalbuterol  1.25 mg Nebulization QID  ° mouth rinse  15 mL Mouth Rinse 10 times per day  ° methylPREDNISolone (SOLU-MEDROL) injection  40 mg Intravenous Daily  ° mupirocin ointment   Nasal BID  ° pantoprazole (PROTONIX) IV  40 mg Intravenous Q24H  ° sodium chloride flush  10-40 mL Intracatheter Q12H  ° °Continuous Infusions: ° sodium chloride 10 mL/hr at 08/13/21 1848  ° amiodarone 30 mg/hr (08/17/21 0500)  ° feeding supplement (VITAL AF 1.2 CAL) 65 mL/hr at 08/17/21 0600  ° fentaNYL infusion INTRAVENOUS 75 mcg/hr (08/17/21 0500)  ° propofol (DIPRIVAN) infusion 40 mcg/kg/min (08/17/21 0559)  ° °PRN Meds: °sodium chloride, acetaminophen **OR** acetaminophen, bisacodyl, ondansetron **OR** ondansetron (ZOFRAN) IV, sodium chloride flush  ° °Vital Signs  °  °Vitals:  ° 08/17/21 0400 08/17/21 0500 08/17/21 0558 08/17/21 0600  °BP: (!) 86/66 92/71  113/73  °Pulse: 95 97  93  °Resp: (!) 24 (!) 24  (!) 24  °Temp: 99 °F (37.2 °C)     °TempSrc:      °SpO2: 92% 93%  93%  °Weight:   94.3 kg   °Height:      ° ° °Intake/Output Summary (Last 24 hours) at 08/17/2021 0730 °Last data filed at 08/17/2021 0600 °Gross per 24 hour  °Intake 2329.42 ml  °Output 3925 ml  °Net -1595.58 ml  ° ° °Last 3  Weights 08/17/2021 08/16/2021 08/15/2021  °Weight (lbs) 207 lb 14.3 oz 209 lb 3.5 oz 209 lb 7 oz  °Weight (kg) 94.3 kg 94.9 kg 95 kg  °   ° °Telemetry  °  °Atrial fibrillation, rate controlled - Personally Reviewed ° °Physical Exam  ° °GEN: Intubated and sedated; no response to verbal stimuli °Neck: No JVD °Cardiac: irregular, no murmur °Respiratory: Mild rhonchi °GI: Soft, ND °MS: No edema °Neuro:  Not assessed as pt sedated °Psych: Not assessed as pt sedated ° °Labs  °  °High Sensitivity Troponin:   °Recent Labs  °Lab 08/06/21 °2140 08/06/21 °2348 08/10/21 °0350  °TROPONINIHS 4 4 4  ° °   °Chemistry °Recent Labs  °Lab 08/11/21 °0356 08/12/21 °0510 08/14/21 °0258 08/15/21 °0351 08/16/21 °0316 08/16/21 °1459 08/17/21 °0432  °NA 134*   < > 137   < > 138 141 143  °K 4.2   < > 5.5*   < > 4.5 5.2* 4.4  °CL 100   < > 106   < > 102 105 104  °CO2 25   < > 28   < > 28 29 32  °GLUCOSE 208*   < > 318*   < > 241* 351* 168*  °BUN 40*   < >   57*   < > 65* 60* 66*  °CREATININE 0.97   < > 1.08   < > 1.08 1.07 1.06  °CALCIUM 7.9*   < > 8.0*   < > 8.2* 8.2* 8.1*  °MG 2.4  --  2.7*  --   --   --  2.5*  °PROT 5.7*  --   --   --   --   --   --   °ALBUMIN 2.5*  --   --   --   --   --   --   °AST 15  --   --   --   --   --   --   °ALT 31  --   --   --   --   --   --   °ALKPHOS 75  --   --   --   --   --   --   °BILITOT 0.7  --   --   --   --   --   --   °GFRNONAA >60   < > >60   < > >60 >60 >60  °ANIONGAP 9   < > 3*   < > 8 7 7  ° < > = values in this interval not displayed.  ° °  °Lipids  °Recent Labs  °Lab 08/15/21 °0351  °TRIG 182*  ° °  °Hematology °Recent Labs  °Lab 08/15/21 °0351 08/16/21 °0316 08/17/21 °0432  °WBC 16.0* 14.0* 13.6*  °RBC 4.00* 4.01* 3.97*  °HGB 11.1* 11.2* 11.3*  °HCT 35.8* 35.7* 35.0*  °MCV 89.5 89.0 88.2  °MCH 27.8 27.9 28.5  °MCHC 31.0 31.4 32.3  °RDW 14.6 14.4 14.2  °PLT 148* 134* 116*  ° ° ° ° °Patient Profile  °   °65 y.o. male CAD s/p DES to Lcx (2016), permanent Afib, COPD, DMII, HTN, HHLD, OSA who  [facility]ed [facility] with dyspnea found to have Influenza A. Course complicated by acute hypoxic respiratory failure requiring intubation and Afib with RVR for which Cardiology is consulted.  Echocardiogram this admission shows normal LV function, mild left atrial enlargement. ° °Assessment & Plan  °  °1 permanent atrial fibrillation-heart rate is controlled.  Continue IV amiodarone and transition to metoprolol later once he is extubated and tolerating from a hemodynamic standpoint.  Continue Lovenox and transition to apixaban prior to discharge.   ° °2 VDRF/influenza A/pneumonia-continue antibiotics and ventilator management per critical care medicine.  Patient appears to be euvolemic on examination to dry.  Would hold on further diuresis for now. ° °3 coronary artery disease-continue statin. ° °For questions or updates, please contact [facility] °Please consult [email or web address] for contact info under  ° °  °   °Signed, ° , MD  °08/17/2021, 7:30 AM   ° ° °

## 2021-08-18 LAB — GLUCOSE, CAPILLARY
Glucose-Capillary: 153 mg/dL — ABNORMAL HIGH (ref 70–99)
Glucose-Capillary: 202 mg/dL — ABNORMAL HIGH (ref 70–99)
Glucose-Capillary: 211 mg/dL — ABNORMAL HIGH (ref 70–99)
Glucose-Capillary: 193 mg/dL — ABNORMAL HIGH (ref 70–99)
Glucose-Capillary: 320 mg/dL — ABNORMAL HIGH (ref 70–99)
Glucose-Capillary: 252 mg/dL — ABNORMAL HIGH (ref 70–99)
Glucose-Capillary: 192 mg/dL — ABNORMAL HIGH (ref 70–99)

## 2021-08-18 LAB — BASIC METABOLIC PANEL
Anion gap: 5 (ref 5–15)
BUN: 68 mg/dL — ABNORMAL HIGH (ref 8–23)
CO2: 32 mmol/L (ref 22–32)
Calcium: 8 mg/dL — ABNORMAL LOW (ref 8.9–10.3)
Chloride: 104 mmol/L (ref 98–111)
Creatinine, Ser: 0.99 mg/dL (ref 0.61–1.24)
GFR, Estimated: >60 mL/min (ref >60)
Glucose, Bld: 166 mg/dL — ABNORMAL HIGH (ref 70–99)
Potassium: 4.2 mmol/L (ref 3.5–5.1)
Sodium: 141 mmol/L (ref 135–145)

## 2021-08-18 LAB — CBC
HCT: 35.9 % — ABNORMAL LOW (ref 39.0–52.0)
Hemoglobin: 11.8 g/dL — ABNORMAL LOW (ref 13.0–17.0)
MCH: 28.9 pg (ref 26.0–34.0)
MCHC: 32.9 g/dL (ref 30.0–36.0)
MCV: 87.8 fL (ref 80.0–100.0)
Platelets: 111 10*3/uL — ABNORMAL LOW (ref 150–400)
RBC: 4.09 MIL/uL — ABNORMAL LOW (ref 4.22–5.81)
RDW: 14.2 % (ref 11.5–15.5)
WBC: 13 10*3/uL — ABNORMAL HIGH (ref 4.0–10.5)
nRBC: 0 % (ref 0.0–0.2)

## 2021-08-18 LAB — TRIGLYCERIDES: Triglycerides: 153 mg/dL — ABNORMAL HIGH (ref <150)

## 2021-08-18 MED ORDER — ONDANSETRON HCL 4 MG PO TABS: 4.0000 mg | ORAL_TABLET | Freq: Four times a day (QID) | ORAL | Status: DC | PRN

## 2021-08-18 MED ORDER — ACETAMINOPHEN 325 MG PO TABS
650.0000 mg | ORAL_TABLET | Freq: Four times a day (QID) | ORAL | Status: DC | PRN
  Administered 2021-08-20: 21:00:00 650 mg via ORAL
  Filled 2021-08-18: qty 2

## 2021-08-18 MED ORDER — DEXMEDETOMIDINE HCL IN NACL 400 MCG/100ML IV SOLN
0.4000 ug/kg/h | INTRAVENOUS | Status: DC
  Administered 2021-08-18: 19:00:00 0.4 ug/kg/h via INTRAVENOUS
  Administered 2021-08-18: 12:00:00 0.8 ug/kg/h via INTRAVENOUS
  Administered 2021-08-19: 04:00:00 0.5 ug/kg/h via INTRAVENOUS
  Filled 2021-08-18 (×3): qty 100

## 2021-08-18 MED ORDER — ACETAMINOPHEN 650 MG RE SUPP: 650.0000 mg | Freq: Four times a day (QID) | RECTAL | Status: DC | PRN

## 2021-08-18 MED ORDER — ENOXAPARIN SODIUM 100 MG/ML IJ SOSY
90.0000 mg | PREFILLED_SYRINGE | Freq: Two times a day (BID) | INTRAMUSCULAR | Status: DC
  Administered 2021-08-18 – 2021-08-20 (×4): 90 mg via SUBCUTANEOUS
  Filled 2021-08-18 (×5): qty 0.9

## 2021-08-18 MED ORDER — FUROSEMIDE 10 MG/ML IJ SOLN
60.0000 mg | Freq: Once | INTRAMUSCULAR | Status: AC
Start: 2021-08-18 — End: 2021-08-18
  Administered 2021-08-18: 12:00:00 60 mg via INTRAVENOUS
  Filled 2021-08-18: qty 6

## 2021-08-18 MED ORDER — ACETAMINOPHEN 160 MG/5ML PO SOLN
650.0000 mg | Freq: Four times a day (QID) | ORAL | Status: DC | PRN
  Administered 2021-08-18 – 2021-08-19 (×3): 650 mg
  Filled 2021-08-18 (×3): qty 20.3

## 2021-08-18 MED ORDER — ONDANSETRON HCL 4 MG/2ML IJ SOLN
4.0000 mg | Freq: Four times a day (QID) | INTRAMUSCULAR | Status: DC | PRN
  Administered 2021-08-20: 23:00:00 4 mg via INTRAVENOUS
  Filled 2021-08-18: qty 2

## 2021-08-18 NOTE — Progress Notes (Signed)
ANTICOAGULATION CONSULT NOTE - Initial Consult  Pharmacy Consult for lovenox Indication: atrial fibrillation  No Known Allergies  Patient Measurements: Height: 5\' 8"  (172.7 cm) Weight: 91.1 kg (200 lb 13.4 oz) IBW/kg (Calculated) : 68.4 Heparin Dosing Weight:   Vital Signs: Temp: 98.7 F (37.1 C) (12/21 0827) Temp Source: Oral (12/21 0827) BP: 122/77 (12/21 0900) Pulse Rate: 106 (12/21 0900)  Labs: Recent Labs    08/16/21 0316 08/16/21 1459 08/17/21 0432 08/18/21 0336  HGB 11.2*  --  11.3* 11.8*  HCT 35.7*  --  35.0* 35.9*  PLT 134*  --  116* 111*  CREATININE 1.08 1.07 1.06 0.99     Estimated Creatinine Clearance: 81.5 mL/min (by C-G formula based on SCr of 0.99 mg/dL).   Medical History: Past Medical History:  Diagnosis Date   Anemia    Anxiety    Asthma    CAD (coronary artery disease)    DES to distal circumflex 2016   Cataract    Colon polyps    30 colon polyps found on first colonoscopy   Diastolic heart failure (HCC)    Diverticulitis    DJD (degenerative joint disease)    GERD (gastroesophageal reflux disease)    History of kidney stones    Hyperlipidemia    Hypertension    Insomnia    Obstructive sleep apnea 12/2009   01/26/2010 AHI 83/hr   Permanent atrial fibrillation ([place])    Onset 2006 paroxysmal then progressive to persistent   PUD (peptic ulcer disease)    1980s   RLS (restless legs syndrome)    Sinusitis    Skin cancer    Type 2 diabetes mellitus (HCC)     Medications:  Medications Prior to Admission  Medication Sig Dispense Refill Last Dose   albuterol (VENTOLIN HFA) 108 (90 Base) MCG/ACT inhaler INHALE 2 PUFFS INTO THE LUNGS EVERY 6 HOURS AS NEEDED FOR WHEEZING OR SHORTNESS OF BREATH 18 g 11 08/06/2021   apixaban (ELIQUIS) 5 MG TABS tablet TAKE 1 TABLET(5 MG) BY MOUTH TWICE DAILY 180 tablet 1 08/05/2021 at 01200   ARIPiprazole (ABILIFY) 2 MG tablet Take 1 tablet (2 mg total) by mouth daily. 90 tablet 0 08/05/2021   atorvastatin  (LIPITOR) 80 MG tablet Take 1 tablet (80 mg total) by mouth daily. TAKE 1 TABLET(80 MG) BY MOUTH EVERY EVENING 90 tablet 3 08/05/2021   benzonatate (TESSALON PERLES) 100 MG capsule Take 1 capsule (100 mg total) by mouth 3 (three) times daily as needed. 20 capsule 0 08/05/2021   buPROPion (WELLBUTRIN XL) 150 MG 24 hr tablet Take 3 tablets once daily (Patient taking differently: Take 150 mg by mouth 3 (three) times daily with meals.) 270 tablet 3 08/05/2021   docusate sodium (COLACE) 100 MG capsule Take 100 mg by mouth daily as needed for mild constipation.   08/05/2021   escitalopram (LEXAPRO) 20 MG tablet TAKE 1 TABLET(20 MG) BY MOUTH DAILY 90 tablet 0 08/05/2021   ezetimibe (ZETIA) 10 MG tablet TAKE 1 TABLET BY MOUTH EVERY DAY 90 tablet 3 08/05/2021   ferrous sulfate 325 (65 FE) MG tablet Take 325 mg by mouth daily with breakfast.   08/05/2021   furosemide (LASIX) 40 MG tablet Take 1 tablet (40 mg total) by mouth daily. 90 tablet 3 08/05/2021   lisinopril (ZESTRIL) 2.5 MG tablet TAKE 1 TABLET(2.5 MG) BY MOUTH DAILY. 90 tablet 3 08/05/2021   meclizine (ANTIVERT) 25 MG tablet Take 1 tablet (25 mg total) by mouth 3 (three) times daily as  needed for dizziness. 30 tablet 0 unknown   metFORMIN (GLUCOPHAGE) 500 MG tablet TAKE ONE TABLET (500 MG) BY MOUTH TWICE DAILY WITH A MEAL. 180 tablet 0 08/05/2021   metoprolol tartrate (LOPRESSOR) 100 MG tablet Take 1 tablet (100 mg total) by mouth 2 (two) times daily. 180 tablet 0 08/05/2021 at 01200   nitroGLYCERIN (NITROSTAT) 0.4 MG SL tablet Place 1 tablet (0.4 mg total) under the tongue every 5 (five) minutes as needed. 25 tablet 3 unknown   omeprazole (PRILOSEC) 40 MG capsule Take 1 capsule by mouth once daily 30 minutes before breakfast 90 capsule 3 08/05/2021   ondansetron (ZOFRAN) 4 MG tablet Take 1 tablet (4 mg total) by mouth every 8 (eight) hours as needed for nausea or vomiting. 30 tablet 1 unknown   potassium chloride SA (KLOR-CON) 20 MEQ tablet Take 1 tablet (20 mEq  total) by mouth daily. 90 tablet 3 08/05/2021   rOPINIRole (REQUIP) 3 MG tablet TAKE 1 TABLET BY MOUTH EVERY NIGHT AT BEDTIME 450 tablet 0 08/05/2021   sildenafil (VIAGRA) 100 MG tablet TAKE 1 TABLET BY MOUTH 30 MINUTES BEFORE ACTIVITY 8 tablet 0 unknown   sucralfate (CARAFATE) 1 g tablet Take one tablet po BID PRN (Patient taking differently: Take 1 g by mouth 2 (two) times daily as needed (acid reflux).) 42 tablet 5 08/05/2021   traMADol (ULTRAM) 50 MG tablet Take 1 tablet (50 mg total) by mouth twice daily as needed for moderate pain 60 tablet 4 08/05/2021   traZODone (DESYREL) 50 MG tablet TAKE 2 TABLETS (100 MG) BY MOUTH AT BEDTIME 180 tablet 1 08/05/2021   pseudoephedrine (SUDAFED) 30 MG tablet Take 30 mg by mouth every 6 (six) hours as needed for congestion. (Patient not taking: Reported on 08/06/2021)   Not Taking    Assessment: Pharmacy consulted to dose lovenox in patient with atrial fibrillation.  Patient is on apixaban prior to admission with last dose given 12/12 0944 and has been transitioned to LMWH.    Scr remains stable at 0.99 (CrCl 81 mL/min). Hgb 11.8, plt downtrending slightly to 111 (will continue to monitor). Wt is 91 kg today.   Goal of Therapy:  Monitor platelets by anticoagulation protocol: Yes   Plan:  Reduce lovenox to 90 mg subq every 12 hours Monitor H&H and platelets. Follow-up ability to transition back to [place] as appropriate based on clinical course  Antonietta Jewel, PharmD, [place] Pharmacist  Phone: [phone] 08/18/2021 10:10 AM  Please check AMION for all [place] phone numbers After 10:00 PM, call [place] [phone]

## 2021-08-18 NOTE — Progress Notes (Signed)
NAME:  Shaun [name], MRN:  [id number], DOB:  1956/03/02, LOS: 12 ADMISSION DATE:  08/06/2021, CONSULTATION DATE:  12/12 REFERRING MD:  Richarda Blade, CHIEF COMPLAINT:  resp distress    History of Present Illness:     Shaun [name] is a 65 y/o active smoker with normal PFTs 06/2019 with history of anxiety, ASCVD, CHF,  diabetes mellitus type 2, hyperlipidemia, hypertension, obstructive sleep apnea, CAF and more presented to ED with chief complaint of dyspnea.  Patient reports he has had fever and dyspnea for 1 week PTA. He reports that he was diagnosed with the flu 6 days PTA.  He was given a steroid and Tessalon Perles per his report.   He had a T-max at home of 102.    Patient does admit to orthopnea but no increase in peripheral edema.  He has a cough but is only occasionally productive with green sputum.  No hemoptysis.  He does have some chest tightness that is been going on for the whole week.  It is worse with exertion, better with rest.  Worse lying  flat as well.  Patient reports feeling lightheaded when he has the chest tightness.    He does not wear oxygen at home.    He does sleep with a CPAP at home.  Patient does report decreased p.o. intake.  Yesterday all he had all day was chicken soup.  Last bowel movement was yesterday.      The ED Temp 99.7, heart rate 101-125, respiratory rate 16-25, blood pressure 125/88, satting 96% on 3 L nasal cannula  Was improving but rapidly worse sob / desats 12/12 requiring bipap and then intubation by anesthesia    Pertinent  Medical History  CAF - no recent echo, lat one 03/2019  Mod LAE with elevated LVEDP HBP DM OSA on CPAP   Significant Hospital Events: Including procedures, antibiotic start and stop dates in addition to other pertinent events   Resp viral panel 12/9 >  POS FLU - tamiflu  rx 12/10 - 12/14  Oral ET  12/12  L IJ  CVL 12/12 Placed on Amiodarone drip 12/12>>> MRSA screen 12/12   POS BC x 2 12/12 >>> Maxepime per [facility]  12/12 >>> Trach sample  12/13 mixed orgs, ambundant wbc>>> nl flora 12/13 Echo Mod lvh, mild LAE 12/14 added mucomyst for LLL Atx> improved 12/15 - d/c 12/16  12/17 transfer to [facility] 12/20 trial PEEP reduction and SAT today  Interim History / Subjective:    Objective   Blood pressure 99/61, pulse 91, temperature 98.7 F (37.1 C), temperature source Oral, resp. rate (!) 24, height 5\' 8"  (1.727 m), weight 91.1 kg, SpO2 93 %.    Vent Mode: PRVC FiO2 (%):  [40 %-50 %] 45 % Set Rate:  [24 bmp] 24 bmp Vt Set:  [540 mL] 540 mL PEEP:  [6 cmH20-8 cmH20] 6 cmH20 Plateau Pressure:  [15 cmH20-17 cmH20] 15 cmH20   Intake/Output Summary (Last 24 hours) at 08/18/2021 0857 Last data filed at 08/18/2021 0700 Gross per 24 hour  Intake 2603.3 ml  Output 2325 ml  Net 278.3 ml    Filed Weights   08/16/21 0320 08/17/21 0558 08/18/21 0500  Weight: 94.9 kg 94.3 kg 91.1 kg    General:  critically ill-appearing M intubated and sedated HEENT: MM pink/moist, sclera anicteric, pupils equal Neuro: examined on propofol and fentanyl, RASS -3 CV: s1s2 irregular, no m/r/g PULM:  mechanical vent sounds bilaterally, no rhonchi or wheezing and synchronous with  vent GI: soft, bsx4 active  Extremities: warm/dry, no edema or cyanosis   Skin: no rashes or lesions  I/O -1.6L Net +952Lcc for admission  BUN 66 Cr 0.99 WBC 13 H/H 11.8 Platelets 111 Bgs 168  Assessment & Plan:  Acute respiratory failure with hypoxia in pt with Influenza A. Concern for post-flu bacterial pneumonia, possibly acute pulmonary edema. -Wake up more today - Diurese again - Attempt to wean  Chronic Afib -  controlled on Amiodarone IV since 12/12 -con't IV amiodarone -con't lovenox twice daily -continue holding PTA apixaban, metoprolol  Hyperglycemia, now controlled.  - Continue current insulin regimen  Acute anemia due to critical illness -transfuse for Hb<7 or hemodynamically significant bleeding  Thrombocytopenia of  critical illness - continue to monitor  Tobacco abuse -I have previously discussed with wife how important it will be to quit smoking; can counsel him when more appropriate  Wife and daughter updated at bedside today.  Best Practice (right click and "Reselect all SmartList Selections" daily)   Diet/type: tube feeding  DVT prophylaxis: LMWH GI prophylaxis: PPI Lines: Central line Foley:  Yes, and it is still needed Code Status:  full code Last date of multidisciplinary goals of care discussion  12/18   Labs   CBC: Recent Labs  Lab 08/14/21 0258 08/15/21 0351 08/16/21 0316 08/17/21 0432 08/18/21 0336  WBC 15.7* 16.0* 14.0* 13.6* 13.0*  HGB 10.9* 11.1* 11.2* 11.3* 11.8*  HCT 34.6* 35.8* 35.7* 35.0* 35.9*  MCV 88.0 89.5 89.0 88.2 87.8  PLT 146* 148* 134* 116* 111*     Basic Metabolic Panel: Recent Labs  Lab 08/14/21 0258 08/15/21 0351 08/16/21 0316 08/16/21 1459 08/17/21 0432 08/18/21 0336  NA 137 140 138 141 143 141  K 5.5* 5.1 4.5 5.2* 4.4 4.2  CL 106 106 102 105 104 104  CO2 28 28 28 29  32 32  GLUCOSE 318* 326* 241* 351* 168* 166*  BUN 57* 59* 65* 60* 66* 68*  CREATININE 1.08 1.02 1.08 1.07 1.06 0.99  CALCIUM 8.0* 8.4* 8.2* 8.2* 8.1* 8.0*  MG 2.7*  --   --   --  2.5*  --   PHOS 3.3  --   --   --   --   --     GFR: Estimated Creatinine Clearance: 81.5 mL/min (by C-G formula based on SCr of 0.99 mg/dL). Recent Labs  Lab 08/15/21 0351 08/16/21 0316 08/17/21 0432 08/18/21 0336  WBC 16.0* 14.0* 13.6* 13.0*    CRITICAL CARE Performed by: Kipp Brood   Total critical care time: 40 minutes  Critical care time was exclusive of separately billable procedures and treating other patients.  Critical care was necessary to treat or prevent imminent or life-threatening deterioration.  Critical care was time spent personally by me on the following activities: development of treatment plan with patient and/or surrogate as well as nursing, discussions with  consultants, evaluation of patient's response to treatment, examination of patient, obtaining history from patient or surrogate, ordering and performing treatments and interventions, ordering and review of laboratory studies, ordering and review of radiographic studies, pulse oximetry and re-evaluation of patient's condition.   Kipp Brood, MD [facility] ICU Physician [place]  Pager: [phone] Or Epic Secure Chat After hours: [phone].  08/18/2021, 9:16 AM

## 2021-08-18 NOTE — Progress Notes (Signed)
Nutrition Follow-up  DOCUMENTATION CODES:   Obesity unspecified  INTERVENTION:   Tube Feeding via OG:  Vital AF 1.2 at 65 ml/hr Pro-Source TF 45 mL BID Provides 1952 kcals, 139 g of protein and 1264 mL of free water  Additional kcals from propofol   NUTRITION DIAGNOSIS:   Inadequate oral intake related to inability to eat as evidenced by NPO status.  Being addressed via TF   GOAL:   Provide needs based on ASPEN/SCCM guidelines  Progressing  MONITOR:   Vent status  REASON FOR ASSESSMENT:   Ventilator    ASSESSMENT:   Patient is a 65 yo with history of CHF, HTN, DM2, anxiety who reports with dyspnea. Patient and spouse have the flu per nursing.  Pt remains sedated on vent support Propofol: 11.5 ml/hr  Tolerating Vital AF 1.2 at 65 ml/hr, Pro-Source TF 45 mL BID via OG  Weight has fluctuated up and down but overall relatively stable  Labs: CBGs 153-294 Meds: ss novolog, colace, novolog q 4 hours, levemir   Diet Order:   Diet Order             Diet NPO time specified  Diet effective now                   EDUCATION NEEDS:   Not appropriate for education at this time  Skin:  Skin Assessment: Reviewed RN Assessment  Last BM:  12/16  Height:   Ht Readings from Last 1 Encounters:  08/13/21 5\' 8"  (1.727 m)    Weight:   Wt Readings from Last 1 Encounters:  08/18/21 91.1 kg     BMI:  Body mass index is 30.54 kg/m.  Estimated Nutritional Needs:   Kcal:  1700-1900  Protein:  135-143  Fluid:  < 2liters daily   Kerman Passey MS, RDN, LDN, CNSC Registered Dietitian III Clinical Nutrition RD Pager and On-Call Pager Number Located in [place]

## 2021-08-19 ENCOUNTER — Inpatient Hospital Stay (HOSPITAL_COMMUNITY): Payer: 59

## 2021-08-19 LAB — BASIC METABOLIC PANEL
Anion gap: 6 (ref 5–15)
BUN: 63 mg/dL — ABNORMAL HIGH (ref 8–23)
CO2: 32 mmol/L (ref 22–32)
Calcium: 7.9 mg/dL — ABNORMAL LOW (ref 8.9–10.3)
Chloride: 104 mmol/L (ref 98–111)
Creatinine, Ser: 0.93 mg/dL (ref 0.61–1.24)
GFR, Estimated: >60 mL/min (ref >60)
Glucose, Bld: 241 mg/dL — ABNORMAL HIGH (ref 70–99)
Potassium: 3.7 mmol/L (ref 3.5–5.1)
Sodium: 142 mmol/L (ref 135–145)

## 2021-08-19 LAB — CBC
HCT: 36.4 % — ABNORMAL LOW (ref 39.0–52.0)
Hemoglobin: 11.7 g/dL — ABNORMAL LOW (ref 13.0–17.0)
MCH: 28.2 pg (ref 26.0–34.0)
MCHC: 32.1 g/dL (ref 30.0–36.0)
MCV: 87.7 fL (ref 80.0–100.0)
Platelets: 110 10*3/uL — ABNORMAL LOW (ref 150–400)
RBC: 4.15 MIL/uL — ABNORMAL LOW (ref 4.22–5.81)
RDW: 14.3 % (ref 11.5–15.5)
WBC: 10.7 10*3/uL — ABNORMAL HIGH (ref 4.0–10.5)
nRBC: 0 % (ref 0.0–0.2)

## 2021-08-19 LAB — GLUCOSE, CAPILLARY
Glucose-Capillary: 208 mg/dL — ABNORMAL HIGH (ref 70–99)
Glucose-Capillary: 185 mg/dL — ABNORMAL HIGH (ref 70–99)
Glucose-Capillary: 82 mg/dL (ref 70–99)

## 2021-08-19 MED ORDER — ESCITALOPRAM OXALATE 20 MG PO TABS
20.0000 mg | ORAL_TABLET | Freq: Every day | ORAL | Status: DC
  Administered 2021-08-20 – 2021-08-25 (×6): 20 mg via ORAL
  Filled 2021-08-19: qty 1
  Filled 2021-08-19: qty 2
  Filled 2021-08-19 (×4): qty 1

## 2021-08-19 MED ORDER — DEXTROSE IN LACTATED RINGERS 5 % IV SOLN
INTRAVENOUS | Status: DC
Start: 2021-08-19 — End: 2021-08-23

## 2021-08-19 MED ORDER — ARIPIPRAZOLE 2 MG PO TABS
2.0000 mg | ORAL_TABLET | Freq: Every day | ORAL | Status: DC
  Administered 2021-08-20 – 2021-08-25 (×6): 2 mg via ORAL
  Filled 2021-08-19 (×6): qty 1

## 2021-08-19 MED ORDER — MORPHINE SULFATE (PF) 2 MG/ML IV SOLN
2.0000 mg | INTRAVENOUS | Status: DC | PRN
  Administered 2021-08-19 – 2021-08-20 (×4): 2 mg via INTRAVENOUS
  Filled 2021-08-19 (×4): qty 1

## 2021-08-19 MED ORDER — POTASSIUM CHLORIDE 20 MEQ PO PACK
40.0000 meq | PACK | Freq: Once | ORAL | Status: AC
  Administered 2021-08-19: 06:00:00 40 meq
  Filled 2021-08-19: qty 2

## 2021-08-19 MED ORDER — DIPHENHYDRAMINE HCL 50 MG/ML IJ SOLN
50.0000 mg | Freq: Once | INTRAMUSCULAR | Status: AC | PRN
  Administered 2021-08-19: 20:00:00 50 mg via INTRAVENOUS
  Filled 2021-08-19: qty 1

## 2021-08-19 MED ORDER — ATORVASTATIN CALCIUM 80 MG PO TABS
80.0000 mg | ORAL_TABLET | Freq: Every day | ORAL | Status: DC
  Administered 2021-08-21 – 2021-08-24 (×4): 80 mg via ORAL
  Filled 2021-08-19 (×5): qty 1

## 2021-08-19 MED ORDER — DOCUSATE SODIUM 100 MG PO CAPS
100.0000 mg | ORAL_CAPSULE | Freq: Every day | ORAL | Status: DC
  Administered 2021-08-21 – 2021-08-25 (×2): 100 mg via ORAL
  Filled 2021-08-19 (×3): qty 1

## 2021-08-19 NOTE — Procedures (Signed)
Extubation Procedure Note  Patient Details:   Name: Shaun [name] DOB: 16-May-1956 MRN: [id number]   Airway Documentation:  + cuff leak test prior to extubation.     Vent end date: 08/19/21 Vent end time: 0955   Evaluation  O2 sats: stable throughout Complications: No apparent complications Patient did tolerate procedure well. Bilateral Breath Sounds: Diminished (coarse)   Yes pt able to speak, no distress noted.  Pt was on 45% fio2 prior to extubation, sat 91-95% (MD aware). Pt was placed on 11 lpm salter HFNC for extubation.  Sat 94%.  No stridor noted, no distress noted.  PT states he's breathing "okay".    Lenna Sciara 08/19/2021, 9:58 AM

## 2021-08-19 NOTE — Progress Notes (Signed)
[facility] ADULT ICU REPLACEMENT PROTOCOL   The patient does apply for the [facility] Adult ICU Electrolyte Replacment Protocol based on the criteria listed below:   1.Exclusion criteria: TCTS patients, ECMO patients, and Dialysis patients 2. Is GFR >/= 30 ml/min? Yes.    Patient's GFR today is >60 3. Is SCr </= 2? No. Patient's SCr is 0.93 mg/dL 4. Did SCr increase >/= 0.5 in 24 hours? No. 5.Pt's weight >40kg  Yes.   6. Abnormal electrolyte(s): K 3.7  7. Electrolytes replaced per protocol   Christeen Douglas 08/19/2021 5:26 AM

## 2021-08-19 NOTE — Progress Notes (Signed)
NAME:  Shaun [name], MRN:  [id number], DOB:  [birth year], LOS: 55 ADMISSION DATE:  08/06/2021, CONSULTATION DATE:  12/12 REFERRING MD:  Richarda Blade, CHIEF COMPLAINT:  resp distress    History of Present Illness:     Shaun [name] is a 65 y/o active smoker with normal PFTs 06/2019 with history of anxiety, ASCVD, CHF,  diabetes mellitus type 2, hyperlipidemia, hypertension, obstructive sleep apnea, CAF and more presented to ED with chief complaint of dyspnea.  Patient reports he has had fever and dyspnea for 1 week PTA. He reports that he was diagnosed with the flu 6 days PTA.  He was given a steroid and Tessalon Perles per his report.   He had a T-max at home of 102.    Patient does admit to orthopnea but no increase in peripheral edema.  He has a cough but is only occasionally productive with green sputum.  No hemoptysis.  He does have some chest tightness that is been going on for the whole week.  It is worse with exertion, better with rest.  Worse lying  flat as well.  Patient reports feeling lightheaded when he has the chest tightness.    He does not wear oxygen at home.    He does sleep with a CPAP at home.  Patient does report decreased p.o. intake.  Yesterday all he had all day was chicken soup.  Last bowel movement was yesterday.      The ED Temp 99.7, heart rate 101-125, respiratory rate 16-25, blood pressure 125/88, satting 96% on 3 L nasal cannula  Was improving but rapidly worse sob / desats 12/12 requiring bipap and then intubation by anesthesia    Pertinent  Medical History  CAF - no recent echo, lat one 03/2019  Mod LAE with elevated LVEDP HBP DM OSA on CPAP   Significant Hospital Events: Including procedures, antibiotic start and stop dates in addition to other pertinent events   Resp viral panel 12/9 >  POS FLU - tamiflu  rx 12/10 - 12/14  Oral ET  12/12  L IJ  CVL 12/12 Placed on Amiodarone drip 12/12>>> MRSA screen 12/12   POS BC x 2 12/12 >>> Maxepime per [facility]  12/12 >>> Trach sample  12/13 mixed orgs, ambundant wbc>>> nl flora 12/13 Echo Mod lvh, mild LAE 12/14 added mucomyst for LLL Atx> improved 12/15 - d/c 12/16  12/17 transfer to [facility] 12/20 trial PEEP reduction and SAT today  Interim History / Subjective:   Awake today and tolerating SBT with strong cough.   Objective   Blood pressure 111/80, pulse 84, temperature 98.3 F (36.8 C), temperature source Axillary, resp. rate (!) 23, height 5\' 8"  (1.727 m), weight 91.3 kg, SpO2 94 %.    Vent Mode: PSV;CPAP FiO2 (%):  [40 %-45 %] 45 % Set Rate:  [24 bmp] 24 bmp Vt Set:  [540 mL] 540 mL PEEP:  [5 cmH20] 5 cmH20 Pressure Support:  [5 cmH20] 5 cmH20 Plateau Pressure:  [10 cmH20-14 cmH20] 13 cmH20   Intake/Output Summary (Last 24 hours) at 08/19/2021 0934 Last data filed at 08/19/2021 0800 Gross per 24 hour  Intake 2461.96 ml  Output 3750 ml  Net -1288.04 ml    Filed Weights   08/17/21 0558 08/18/21 0500 08/19/21 0200  Weight: 94.3 kg 91.1 kg 91.3 kg    General:  critically ill-appearing M intubated and sedated HEENT: MM pink/moist, sclera anicteric, pupils equal Neuro: sedation is off. Patient has reasonable limb and core strength.  Following commands and trying to communicate CV: s1s2 irregular, no m/r/g PULM:  mechanical vent sounds bilaterally, no rhonchi or wheezing and synchronous with vent . Tolerating SBT.  GI: soft, bsx4 active  Extremities: warm/dry, no edema or cyanosis   Skin: no rashes or lesions  I/O -1.6L Net +952Lcc for admission  Cr 0.93 WBC  10.7 H/H 11.7 Platelets 110  Assessment & Plan:  Acute respiratory failure with hypoxia in pt with Influenza A. Concern for post-flu bacterial pneumonia, possibly acute pulmonary edema. -extubate today  Chronic Afib -  controlled on Amiodarone IV since 12/12 -transition to oral amiodarone post extubation.  -con't lovenox twice daily -continue holding PTA apixaban, metoprolol  Hyperglycemia, now controlled.  -  Continue current insulin regimen  Acute anemia due to critical illness -transfuse for Hb<7 or hemodynamically significant bleeding  Thrombocytopenia of critical illness - continue to monitor  Tobacco abuse -I have previously discussed with wife how important it will be to quit smoking; can counsel him when more appropriate  Wife and daughter updated at bedside today.  Best Practice (right click and "Reselect all SmartList Selections" daily)   Diet/type: tube feeding  DVT prophylaxis: LMWH GI prophylaxis: PPI Lines: Central line Foley:  Yes, and it is still needed Code Status:  full code Last date of multidisciplinary goals of care discussion  12/18   Labs   CBC: Recent Labs  Lab 08/15/21 0351 08/16/21 0316 08/17/21 0432 08/18/21 0336 08/19/21 0339  WBC 16.0* 14.0* 13.6* 13.0* 10.7*  HGB 11.1* 11.2* 11.3* 11.8* 11.7*  HCT 35.8* 35.7* 35.0* 35.9* 36.4*  MCV 89.5 89.0 88.2 87.8 87.7  PLT 148* 134* 116* 111* 110*     Basic Metabolic Panel: Recent Labs  Lab 08/14/21 0258 08/15/21 0351 08/16/21 0316 08/16/21 1459 08/17/21 0432 08/18/21 0336 08/19/21 0339  NA 137   < > 138 141 143 141 142  K 5.5*   < > 4.5 5.2* 4.4 4.2 3.7  CL 106   < > 102 105 104 104 104  CO2 28   < > 28 29 32 32 32  GLUCOSE 318*   < > 241* 351* 168* 166* 241*  BUN 57*   < > 65* 60* 66* 68* 63*  CREATININE 1.08   < > 1.08 1.07 1.06 0.99 0.93  CALCIUM 8.0*   < > 8.2* 8.2* 8.1* 8.0* 7.9*  MG 2.7*  --   --   --  2.5*  --   --   PHOS 3.3  --   --   --   --   --   --    < > = values in this interval not displayed.    GFR: Estimated Creatinine Clearance: 86.9 mL/min (by C-G formula based on SCr of 0.93 mg/dL). Recent Labs  Lab 08/16/21 0316 08/17/21 0432 08/18/21 0336 08/19/21 0339  WBC 14.0* 13.6* 13.0* 10.7*    CRITICAL CARE Performed by: Kipp Brood   Total critical care time: 40 minutes  Critical care time was exclusive of separately billable procedures and treating other  patients.  Critical care was necessary to treat or prevent imminent or life-threatening deterioration.  Critical care was time spent personally by me on the following activities: development of treatment plan with patient and/or surrogate as well as nursing, discussions with consultants, evaluation of patient's response to treatment, examination of patient, obtaining history from patient or surrogate, ordering and performing treatments and interventions, ordering and review of laboratory studies, ordering and review of radiographic studies,  pulse oximetry and re-evaluation of patient's condition.   Kipp Brood, MD [facility] ICU Physician [place]  Pager: [phone] Or Epic Secure Chat After hours: [phone].  08/19/2021, 9:34 AM

## 2021-08-19 NOTE — Progress Notes (Signed)
eLink Physician-Brief Progress Note Patient Name: Shaun [name] DOB: 08/29/1956 MRN: [id number]   Date of Service  08/19/2021  HPI/Events of Note  Request to confirm OGT position after replacement.   eICU Interventions  Plan: Will order portable abdominal film STAT.     Intervention Category Major Interventions: Other:  Rether Rison Cornelia Copa 08/19/2021, 6:06 AM

## 2021-08-19 NOTE — Progress Notes (Signed)
[place] Progress Note Patient Name: Shaun [name] DOB: [birth year] MRN: [id number]   Date of Service  08/19/2021  HPI/Events of Note  RN reports pt is extremely anxious & voicing concerns that he can't get comfortable or won't be able to sleep.  Pt takes several anxiolytics at home.  Failed swallow study w/speech therapy today, so is NPO.  RN asking if he can get order for low dose precedex gtt.  Patient and wife at bedside states he takes trazodone for sleep, oxycodone for hip pain and ropirinole for restless leg. He is receiving  morphine prn but when asked if this helps patient shook his head meaning no. Offered Benadryl and he agreed.  eICU Interventions  Ordered a trial of Benadryl 50 mg IV for insonmia     Intervention Category Minor Interventions: Agitation / anxiety - evaluation and management  Shaun [name] 08/19/2021, 7:54 PM

## 2021-08-19 NOTE — Evaluation (Signed)
Clinical/Bedside Swallow Evaluation Patient Details  Name: Shaun [name] MRN: [id number] Date of Birth: 1956-07-09  Today's Date: 08/19/2021 Time: SLP Start Time (ACUTE ONLY): 38 SLP Stop Time (ACUTE ONLY): 1445 SLP Time Calculation (min) (ACUTE ONLY): 15 min  Past Medical History:  Past Medical History:  Diagnosis Date   Anemia    Anxiety    Asthma    CAD (coronary artery disease)    DES to distal circumflex 2016   Cataract    Colon polyps    30 colon polyps found on first colonoscopy   Diastolic heart failure (HCC)    Diverticulitis    DJD (degenerative joint disease)    GERD (gastroesophageal reflux disease)    History of kidney stones    Hyperlipidemia    Hypertension    Insomnia    Obstructive sleep apnea 12/2009   01/26/2010 AHI 83/hr   Permanent atrial fibrillation ([place])    Onset 2006 paroxysmal then progressive to persistent   PUD (peptic ulcer disease)    1980s   RLS (restless legs syndrome)    Sinusitis    Skin cancer    Type 2 diabetes mellitus ([place])    Past Surgical History:  Past Surgical History:  Procedure Laterality Date   BIOPSY  07/17/2018   Procedure: BIOPSY;  Surgeon: Danie Binder, MD;  [facility];  Service: Endoscopy;;  colon   BOWEL RESECTION  09/17/2018   SMALL BOWEL RESECTION: 71 CM    CARDIAC CATHETERIZATION N/A 07/21/2015   Procedure: Left Heart Cath and Coronary Angiography;  Surgeon: Peter M Martinique, MD;  Location: [place] CV LAB;  Service: Cardiovascular;  Laterality: N/A;   CARDIAC CATHETERIZATION N/A 07/21/2015   Procedure: Coronary Stent Intervention;  Surgeon: Peter M Martinique, MD;  Location: [place] CV LAB;  Service: Cardiovascular;  Laterality: N/A;   CIRCUMCISION N/A 04/05/2019   Procedure: CIRCUMCISION ADULT;  Surgeon: Irine Seal, MD;  Location: [facility] OR[facility];  Service: Urology;  Laterality: N/A;   COLONOSCOPY N/A 05/19/2014   Dr. Barnie Alderman diverticulosis/moderate external hemorrhoids, >20 simple adenomas.  Genetic screening negative.    COLONOSCOPY WITH PROPOFOL N/A 07/17/2018   Dr. Oneida Alar: Diverticulosis, external/internal hemorrhoids, 32 colon polyps removed.  ten tubular adenomas removed with no high-grade dysplasia.  Advised to have surveillance colonoscopy in 3 years.   COLONOSCOPY WITH PROPOFOL N/A 06/07/2021   Procedure: COLONOSCOPY WITH PROPOFOL;  Surgeon: Eloise Harman, DO;  [facility]: [facility];  Service: Endoscopy;  Laterality: N/A;  9:30 / ASA 3  (Pt was told that his time will be given at Pre-op)   ESOPHAGOGASTRODUODENOSCOPY (EGD) WITH PROPOFOL N/A 07/17/2018   Dr. Oneida Alar: Low-grade narrowing Schatzki ring at the GE junction status post dilation.  Gastritis.  Biopsy with mild nonspecific reactive gastropathy.  No H. pylori.   GIVENS CAPSULE STUDY N/A 06/24/2019   normal   HERNIA REPAIR  1986   Left inguinal   INTRAVASCULAR PRESSURE WIRE/FFR STUDY Left 06/08/2017   Procedure: INTRAVASCULAR PRESSURE WIRE/FFR STUDY;  Surgeon: Nelva Bush, MD;  Location: [place] CV LAB;  Service: Cardiovascular;  Laterality: Left;  LAD and CFX   LAPAROTOMY N/A 09/17/2018   Procedure: EXPLORATORY LAPAROTOMY;  Surgeon: Virl Cagey, MD;  Location: [facility];  Service: General;  Laterality: N/A;   LEFT HEART CATH AND CORONARY ANGIOGRAPHY N/A 06/08/2017   Procedure: LEFT HEART CATH AND CORONARY ANGIOGRAPHY;  Surgeon: Nelva Bush, MD;  Location: [place] [facility] LAB;  Service: Cardiovascular;  Laterality: N/A;  POLYPECTOMY  07/17/2018   Procedure: POLYPECTOMY;  Surgeon: Danie Binder, MD;  [facility];  Service: Endoscopy;;  colon   POLYPECTOMY  06/07/2021   Procedure: POLYPECTOMY INTESTINAL;  Surgeon: Eloise Harman, DO;  [facility];  Service: Endoscopy;;   ROTATOR CUFF REPAIR     Right   SAVORY DILATION N/A 07/17/2018   Procedure: SAVORY DILATION;  Surgeon: Danie Binder, MD;  [facility];  Service: Endoscopy;  Laterality: N/A;   HPI:   65 y/o male presented to ED 12/9 with flu/fever/dyspnea; desaturated 12/12 requiring bipap then intubation; extubated 12/22.  PMHx of active smoker, anxiety, CAD, permanent afib,  COPD, DMII, HTN, HHLD, OSA. Nov 2019 EGD with dilation of low grade Schatzi's ring.    Assessment / Plan / Recommendation  Clinical Impression  Pr presents with indications of a post-extubation dysphagia, not unexpected given ten-day oral intubation.  Voice is hypophonic and hoarse.  Coughing was elicited after all trials of thin liquid, concerning for aspiration related to glottal insufficiency.  D/W pt, wife, and son - recommend continuing NPO for today, excluding occasional ice chips.  SLP will return next date to re-assess - likely will need to proceed with FEES at bedside. D/W RN. Will follow. SLP Visit Diagnosis: Dysphagia, pharyngeal phase (R13.13)    Aspiration Risk    TBA   Diet Recommendation    NPO except occasional ice chips for today      Other  Recommendations Oral Care Recommendations: Oral care QID;Oral care prior to ice chip/H20    Recommendations for follow up therapy are one component of a multi-disciplinary discharge planning process, led by the attending physician.  Recommendations may be updated based on patient status, additional functional criteria and insurance authorization.  Follow up Recommendations Other (comment) (tba)              Frequency and Duration min 3x week  2 weeks       Prognosis Prognosis for Safe Diet Advancement: Good      Swallow Study   General Date of Onset: 08/09/21 HPI: 65 y/o male presented to ED 12/9 with flu/fever/dyspnea; desaturated 12/12 requiring bipap then intubation; extubated 12/22.  PMHx of active smoker, anxiety, CAD, permanent afib,  COPD, DMII, HTN, HHLD, OSA. Type of Study: Bedside Swallow Evaluation Previous Swallow Assessment: no Diet Prior to this Study: NPO Temperature Spikes Noted: Yes Respiratory Status: Nasal cannula (10 L  HFNC) History of Recent Intubation: Yes Length of Intubations (days): 10 days Date extubated: 08/19/21 Behavior/Cognition: Alert;Cooperative Oral Cavity Assessment: Within Functional Limits Oral Care Completed by SLP: Recent completion by staff Self-Feeding Abilities: Able to feed self Patient Positioning: Upright in bed Baseline Vocal Quality: Hoarse;Low vocal intensity Volitional Cough: Strong Volitional Swallow: Able to elicit    Oral/Motor/Sensory Function Overall Oral Motor/Sensory Function: Within functional limits   Ice Chips Ice chips: Within functional limits Presentation: Spoon   Thin Liquid Thin Liquid: Impaired Presentation: Cup;Straw Pharyngeal  Phase Impairments: Multiple swallows;Cough - Immediate    Nectar Thick Nectar Thick Liquid: Not tested   Honey Thick Honey Thick Liquid: Not tested   Puree Puree: Not tested   Solid     Solid: Not tested      Juan Quam Laurice 08/19/2021,3:17 PM  Estill Bamberg L. Tivis Ringer, [place] Office number [phone] Pager [phone]

## 2021-08-19 NOTE — Progress Notes (Signed)
[place] Progress Note Patient Name: Shaun [name] DOB: 06-02-1956 MRN: [id number]   Date of Service  08/19/2021  HPI/Events of Note  RN reporting pt's chest is red & hot, from nipple line up thru neck.  Pt c/o feeling hot & has placed ice packs to his body.  Normothermic.  BLE cold & mottled, palpable PT & DP pulses bil.  Pt received morphine at 20:21, also had a 2 other doses today.  This is the only new med he's received.  RN did just give benadryl you ordered.  eICU Interventions  Possible morphine reaction which is dose dependent. Discussed with bedside RN to give morphine slowly if needed which he reports he did. If persistent will need alternative for pain relief ideally oral pending swallow evaluation.     Intervention Category Intermediate Interventions: Other:  Judd Lien 08/19/2021, 9:23 PM

## 2021-08-20 ENCOUNTER — Other Ambulatory Visit (HOSPITAL_BASED_OUTPATIENT_CLINIC_OR_DEPARTMENT_OTHER): Payer: Self-pay

## 2021-08-20 DIAGNOSIS — I482 Chronic atrial fibrillation, unspecified: Secondary | ICD-10-CM | POA: Diagnosis not present

## 2021-08-20 LAB — BASIC METABOLIC PANEL
Anion gap: 3 — ABNORMAL LOW (ref 5–15)
BUN: 45 mg/dL — ABNORMAL HIGH (ref 8–23)
CO2: 34 mmol/L — ABNORMAL HIGH (ref 22–32)
Calcium: 8.1 mg/dL — ABNORMAL LOW (ref 8.9–10.3)
Chloride: 110 mmol/L (ref 98–111)
Creatinine, Ser: 0.78 mg/dL (ref 0.61–1.24)
GFR, Estimated: >60 mL/min (ref >60)
Glucose, Bld: 167 mg/dL — ABNORMAL HIGH (ref 70–99)
Potassium: 3.5 mmol/L (ref 3.5–5.1)
Sodium: 147 mmol/L — ABNORMAL HIGH (ref 135–145)

## 2021-08-20 LAB — CBC WITH DIFFERENTIAL/PLATELET
Abs Immature Granulocytes: 0.12 10*3/uL — ABNORMAL HIGH (ref 0.00–0.07)
Basophils Absolute: 0 10*3/uL (ref 0.0–0.1)
Basophils Relative: 0 %
Eosinophils Absolute: 0 10*3/uL (ref 0.0–0.5)
Eosinophils Relative: 0 %
HCT: 39 % (ref 39.0–52.0)
Hemoglobin: 12.4 g/dL — ABNORMAL LOW (ref 13.0–17.0)
Immature Granulocytes: 1 %
Lymphocytes Relative: 13 %
Lymphs Abs: 1.6 10*3/uL (ref 0.7–4.0)
MCH: 28.5 pg (ref 26.0–34.0)
MCHC: 31.8 g/dL (ref 30.0–36.0)
MCV: 89.7 fL (ref 80.0–100.0)
Monocytes Absolute: 0.5 10*3/uL (ref 0.1–1.0)
Monocytes Relative: 4 %
Neutro Abs: 10 10*3/uL — ABNORMAL HIGH (ref 1.7–7.7)
Neutrophils Relative %: 82 %
Platelets: 120 10*3/uL — ABNORMAL LOW (ref 150–400)
RBC: 4.35 MIL/uL (ref 4.22–5.81)
RDW: 14.3 % (ref 11.5–15.5)
WBC: 12.2 10*3/uL — ABNORMAL HIGH (ref 4.0–10.5)
nRBC: 0 % (ref 0.0–0.2)

## 2021-08-20 LAB — CBC
HCT: 39 % (ref 39.0–52.0)
Hemoglobin: 12.4 g/dL — ABNORMAL LOW (ref 13.0–17.0)
MCH: 28.3 pg (ref 26.0–34.0)
MCHC: 31.8 g/dL (ref 30.0–36.0)
MCV: 89 fL (ref 80.0–100.0)
Platelets: 114 10*3/uL — ABNORMAL LOW (ref 150–400)
RBC: 4.38 MIL/uL (ref 4.22–5.81)
RDW: 14.2 % (ref 11.5–15.5)
WBC: 12.4 10*3/uL — ABNORMAL HIGH (ref 4.0–10.5)
nRBC: 0 % (ref 0.0–0.2)

## 2021-08-20 LAB — GLUCOSE, CAPILLARY
Glucose-Capillary: 106 mg/dL — ABNORMAL HIGH (ref 70–99)
Glucose-Capillary: 163 mg/dL — ABNORMAL HIGH (ref 70–99)
Glucose-Capillary: 113 mg/dL — ABNORMAL HIGH (ref 70–99)
Glucose-Capillary: 150 mg/dL — ABNORMAL HIGH (ref 70–99)
Glucose-Capillary: 146 mg/dL — ABNORMAL HIGH (ref 70–99)
Glucose-Capillary: 133 mg/dL — ABNORMAL HIGH (ref 70–99)

## 2021-08-20 MED ORDER — APIXABAN 5 MG PO TABS
5.0000 mg | ORAL_TABLET | Freq: Two times a day (BID) | ORAL | Status: DC
  Administered 2021-08-20 – 2021-08-25 (×10): 5 mg via ORAL
  Filled 2021-08-20 (×10): qty 1

## 2021-08-20 MED ORDER — METOPROLOL TARTRATE 100 MG PO TABS
100.0000 mg | ORAL_TABLET | Freq: Two times a day (BID) | ORAL | Status: DC
  Administered 2021-08-20 – 2021-08-24 (×7): 100 mg via ORAL
  Filled 2021-08-20 (×4): qty 1
  Filled 2021-08-20: qty 2
  Filled 2021-08-20 (×5): qty 1

## 2021-08-20 MED ORDER — INSULIN ASPART 100 UNIT/ML IJ SOLN
0.0000 [IU] | Freq: Three times a day (TID) | INTRAMUSCULAR | Status: DC
  Administered 2021-08-20: 12:00:00 3 [IU] via SUBCUTANEOUS
  Administered 2021-08-20: 18:00:00 4 [IU] via SUBCUTANEOUS
  Administered 2021-08-21: 18:00:00 3 [IU] via SUBCUTANEOUS
  Administered 2021-08-21: 13:00:00 4 [IU] via SUBCUTANEOUS
  Administered 2021-08-21: 09:00:00 7 [IU] via SUBCUTANEOUS
  Administered 2021-08-22: 18:00:00 3 [IU] via SUBCUTANEOUS
  Administered 2021-08-22: 09:00:00 4 [IU] via SUBCUTANEOUS
  Administered 2021-08-22: 13:00:00 3 [IU] via SUBCUTANEOUS
  Administered 2021-08-23: 13:00:00 4 [IU] via SUBCUTANEOUS
  Administered 2021-08-23 – 2021-08-24 (×4): 3 [IU] via SUBCUTANEOUS
  Administered 2021-08-25: 08:00:00 4 [IU] via SUBCUTANEOUS
  Administered 2021-08-25: 13:00:00 3 [IU] via SUBCUTANEOUS

## 2021-08-20 MED ORDER — POTASSIUM CHLORIDE 20 MEQ PO PACK: 40.0000 meq | PACK | Freq: Once | ORAL | Status: DC

## 2021-08-20 MED ORDER — DIPHENHYDRAMINE HCL 50 MG/ML IJ SOLN
25.0000 mg | Freq: Once | INTRAMUSCULAR | Status: AC | PRN
  Administered 2021-08-20: 07:00:00 25 mg via INTRAVENOUS
  Filled 2021-08-20: qty 1

## 2021-08-20 MED ORDER — POTASSIUM CHLORIDE 10 MEQ/50ML IV SOLN
INTRAVENOUS | Status: AC
  Administered 2021-08-20: 05:00:00 10 meq via INTRAVENOUS
  Filled 2021-08-20: qty 50

## 2021-08-20 MED ORDER — POTASSIUM CHLORIDE 10 MEQ/50ML IV SOLN
10.0000 meq | INTRAVENOUS | Status: DC
  Administered 2021-08-20 (×3): 10 meq via INTRAVENOUS
  Filled 2021-08-20 (×3): qty 50

## 2021-08-20 MED ORDER — PANTOPRAZOLE SODIUM 40 MG PO TBEC
40.0000 mg | DELAYED_RELEASE_TABLET | Freq: Every day | ORAL | Status: DC
  Administered 2021-08-20 – 2021-08-25 (×6): 40 mg via ORAL
  Filled 2021-08-20 (×6): qty 1

## 2021-08-20 NOTE — Progress Notes (Addendum)
Progress Note  Patient Name: Shaun [name] Date of Encounter: 08/20/2021  [facility] Cardiologist: Kirk Ruths, MD   Subjective   Extubated; mild dyspnea; CP with inspiration  Inpatient Medications    Scheduled Meds:  ARIPiprazole  2 mg Oral Daily   atorvastatin  80 mg Oral Daily   chlorhexidine gluconate (MEDLINE KIT)  15 mL Mouth Rinse BID   Chlorhexidine Gluconate Cloth  6 each Topical Daily   docusate sodium  100 mg Oral Daily   enoxaparin (LOVENOX) injection  90 mg Subcutaneous Q12H   escitalopram  20 mg Oral Daily   insulin aspart  0-20 Units Subcutaneous Q4H   levalbuterol  1.25 mg Nebulization QID   mouth rinse  15 mL Mouth Rinse 10 times per day   mupirocin ointment   Nasal BID   pantoprazole (PROTONIX) IV  40 mg Intravenous Q24H   sodium chloride flush  10-40 mL Intracatheter Q12H   Continuous Infusions:  sodium chloride 10 mL/hr at 08/13/21 1848   amiodarone 30 mg/hr (08/20/21 0500)   dextrose 5% lactated ringers 50 mL/hr at 08/20/21 0500   potassium chloride 10 mEq (08/20/21 0642)   PRN Meds: sodium chloride, acetaminophen **OR** acetaminophen (TYLENOL) oral liquid 160 mg/5 mL **OR** acetaminophen, bisacodyl, morphine injection, ondansetron **OR** ondansetron (ZOFRAN) IV, sodium chloride flush   Vital Signs    Vitals:   08/20/21 0400 08/20/21 0500 08/20/21 0549 08/20/21 0600  BP: 118/72 131/74  124/75  Pulse: 85 93  86  Resp: _0 Temp: 98.6 F (37 C)  98.5 F (36.9 C)   TempSrc: Oral  Oral   SpO2: 95% 94%  93%  Weight:  94.1 kg    Height:        Intake/Output Summary (Last 24 hours) at 08/20/2021 0720 Last data filed at 08/20/2021 0500 Gross per 24 hour  Intake 1181.87 ml  Output 1150 ml  Net 31.87 ml    Last 3 Weights 08/20/2021 08/19/2021 08/18/2021  Weight (lbs) 207 lb 7.3 oz 201 lb 4.5 oz 200 lb 13.4 oz  Weight (kg) 94.1 kg 91.3 kg 91.1 kg      Telemetry    Atrial fibrillation with PVCs or aberrantly conducted  beats, rate controlled - Personally Reviewed  Physical Exam   GEN:WD, NAD Neck: supple Cardiac: irregular Respiratory: Diminished BS GI: Soft, ND, NT MS: No edema Neuro:  Grossly intact Psych: Normal  Labs    High Sensitivity Troponin:   Recent Labs  Lab 08/06/21 2140 08/06/21 2348 08/10/21 0350  TROPONINIHS _1 Chemistry Recent Labs  Lab 08/14/21 0258 08/15/21 0351 08/17/21 0432 08/18/21 0336 08/19/21 0339 08/20/21 0338  NA 137   < > 143 141 142 147*  K 5.5*   < > 4.4 4.2 3.7 3.5  CL 106   < > 104 104 104 110  CO2 28   < > 32 32 32 34*  GLUCOSE 318*   < > 168* 166* 241* 167*  BUN 57*   < > 66* 68* 63* 45*  CREATININE 1.08   < > 1.06 0.99 0.93 0.78  CALCIUM 8.0*   < > 8.1* 8.0* 7.9* 8.1*  MG 2.7*  --  2.5*  --   --   --   GFRNONAA >60   < > >60 >60 >60 >60  ANIONGAP 3*   < > _2 3*   < > = values in this interval not displayed.  Lipids  Recent Labs  Lab 08/18/21 0336  TRIG 153*     Hematology Recent Labs  Lab 08/18/21 0336 08/19/21 0339 08/20/21 0338  WBC 13.0* 10.7* 12.4*  RBC 4.09* 4.15* 4.38  HGB 11.8* 11.7* 12.4*  HCT 35.9* 36.4* 39.0  MCV 87.8 87.7 89.0  MCH 28.9 28.2 28.3  MCHC 32.9 32.1 31.8  RDW 14.2 14.3 14.2  PLT 111* 110* 114*      Patient Profile     65 y.o. male CAD s/p DES to Lcx (2016), permanent Afib, COPD, DMII, HTN, HHLD, OSA who presented [facility] with dyspnea found to have Influenza A. Course complicated by acute hypoxic respiratory failure requiring intubation and Afib with RVR for which Cardiology is consulted.  Echocardiogram this admission shows normal LV function, mild left atrial enlargement.  Assessment & Plan    1 permanent atrial fibrillation-heart rate is controlled.  Patient now extubated.  When he is tolerating oral medications would discontinue IV amiodarone and resume home dose of metoprolol.  We will also change Lovenox to apixaban.  2 influenza A/pneumonia-management per primary  service.  3 coronary artery disease-continue statin.  We will see again Monday.  Please call over weekend with questions.  For questions or updates, please contact [place] Please consult [email or web address] for contact info under        Signed, Kirk Ruths, MD  08/20/2021, 7:20 AM

## 2021-08-20 NOTE — Progress Notes (Addendum)
NAME:  SHELDEN RABORN, MRN:  [id number], DOB:  [birth year], LOS: 66 ADMISSION DATE:  08/06/2021, CONSULTATION DATE:  12/12 REFERRING MD:  Richarda Blade, CHIEF COMPLAINT:  resp distress    History of Present Illness:     [name] is a 65 y/o active smoker with normal PFTs 06/2019 with history of anxiety, ASCVD, CHF,  diabetes mellitus type 2, hyperlipidemia, hypertension, obstructive sleep apnea, CAF and more presented to ED with chief complaint of dyspnea.  Patient reports he has had fever and dyspnea for 1 week PTA. He reports that he was diagnosed with the flu 6 days PTA.  He was given a steroid and Tessalon Perles per his report.   He had a T-max at home of 102.    Patient does admit to orthopnea but no increase in peripheral edema.  He has a cough but is only occasionally productive with green sputum.  No hemoptysis.  He does have some chest tightness that is been going on for the whole week.  It is worse with exertion, better with rest.  Worse lying  flat as well.  Patient reports feeling lightheaded when he has the chest tightness.    He does not wear oxygen at home.    He does sleep with a CPAP at home.  Patient does report decreased p.o. intake.  Yesterday all he had all day was chicken soup.  Last bowel movement was yesterday.      The ED Temp 99.7, heart rate 101-125, respiratory rate 16-25, blood pressure 125/88, satting 96% on 3 L nasal cannula  Was improving but rapidly worse sob / desats 12/12 requiring bipap and then intubation by anesthesia    Pertinent  Medical History  CAF - no recent echo, lat one 03/2019  Mod LAE with elevated LVEDP HBP DM OSA on CPAP   Significant Hospital Events: Including procedures, antibiotic start and stop dates in addition to other pertinent events   Resp viral panel 12/9 >  POS FLU - tamiflu  rx 12/10 - 12/14  Oral ET  12/12  L IJ  CVL 12/12 Placed on Amiodarone drip 12/12>>> MRSA screen 12/12   POS BC x 2 12/12 >>> Maxepime per [facility]  12/12 >>> Trach sample  12/13 mixed orgs, ambundant wbc>>> nl flora 12/13 Echo Mod lvh, mild LAE 12/14 added mucomyst for LLL Atx> improved 12/15 - d/c 12/16  12/17 transfer to [facility] 12/20 trial PEEP reduction and SAT today 12/21 extubated today.   Interim History / Subjective:  Generally weak, PT is recommending CIR  Objective   Blood pressure 124/87, pulse (!) 102, temperature 97.8 F (36.6 C), temperature source Oral, resp. rate (!) 21, height 5\' 8"  (1.727 m), weight 94.1 kg, SpO2 95 %.        Intake/Output Summary (Last 24 hours) at 08/20/2021 0937 Last data filed at 08/20/2021 0700 Gross per 24 hour  Intake 1348.12 ml  Output 1150 ml  Net 198.12 ml    Filed Weights   08/18/21 0500 08/19/21 0200 08/20/21 0500  Weight: 91.1 kg 91.3 kg 94.1 kg    General:  critically ill-appearing man sitting in chair.  HEENT: MM pink/moist, sclera anicteric, pupils equal Neuro: No focal deficits, general 4/5 strength.  CV: s1s2 irregular, no m/r/g PULM:  chest clear GI: soft, bsx4 active  Extremities: warm/dry, no edema or cyanosis   Skin: no rashes or lesions  I/O -1.6L Net +952Lcc for admission  Cr 0.78 WBC  12.4 H/H 11.7 Platelets 110  Assessment & Plan:  Was critically ill due to acute respiratory failure with hypoxia in pt with Influenza A. Concern for post-flu bacterial pneumonia, possibly acute pulmonary edema. Chronic Afib -  Weakness of critical illness Anxiety Type 2 diabetes  Plan:   - ready to transfer to med/surg, orders reconciled and [facility] notified.  - transition all meds to oral or via tube if fails swallow evaluation.  - will need CIR - Restart home metoprolol and Eliquis for AF  Best Practice (right click and "Reselect all SmartList Selections" daily)   Diet/type: tube feeding  DVT prophylaxis: LMWH GI prophylaxis: PPI Lines: Central line and No longer needed.  Order written to d/c  Foley:  removal ordered  Code Status:  full code Last date of  multidisciplinary goals of care discussion  12/23  35 min spent with >50% of time in counseling and coordination of care.   Kipp Brood, MD [facility] ICU Physician [place]  Pager: [phone] Or Epic Secure Chat After hours: [phone].  08/20/2021, 9:37 AM

## 2021-08-20 NOTE — Progress Notes (Signed)
[facility] ADULT ICU REPLACEMENT PROTOCOL   The patient does apply for the [facility] Adult ICU Electrolyte Replacment Protocol based on the criteria listed below:   1.Exclusion criteria: TCTS patients, ECMO patients, and Dialysis patients 2. Is GFR >/= 30 ml/min? Yes.    Patient's GFR today is >60 3. Is SCr </= 2? Yes.   Patient's SCr is 0.78 mg/dL 4. Did SCr increase >/= 0.5 in 24 hours? No. 5.Pt's weight >40kg  Yes.   6. Abnormal electrolyte(s):  K 3.5  7. Electrolytes replaced per protocol 8.  Call MD STAT for K+ </= 2.5, Phos </= 1, or Mag </= 1 Physician:  E. Margy Clarks Alexandre Lightsey 08/20/2021 4:47 AM

## 2021-08-20 NOTE — Progress Notes (Signed)
[place] Progress Note Patient Name: Shaun [name] DOB: 06-Jul-1956 MRN: [id number]   Date of Service  08/20/2021  HPI/Events of Note  RN reporting pt's chest rash is worse, now on face & legs.  Pt still c/o feeling hot, though normothermic.  Has not received any meds recently per RN.  VSS.  Please camera in for assessment.  Amiodarone drip ongoing and reportedly has been running for 10 days  eICU Interventions  Ordered Benadryl for itching Added differentials to CBC. If with eosinophilia consider discontinuing amiodarone     Intervention Category Intermediate Interventions: Other:  Judd Lien 08/20/2021, 6:31 AM

## 2021-08-20 NOTE — Progress Notes (Signed)
Speech Language Pathology Treatment: Dysphagia  Patient Details Name: Shaun [name] MRN: [id number] DOB: 03-28-1956 Today's Date: 08/20/2021 Time: 4010-2725 SLP Time Calculation (min) (ACUTE ONLY): 15 min  Assessment / Plan / Recommendation Clinical Impression  Pt has persistent dysphonia, although he and his wife report subjective improvement from previous date. He says the ice chips he's been trying occasionally "get hung" and make him cough. During PO trials with SLP, he has frequent coughing that is mostly observed with thin liquids, but not so much with purees. Although anticipate that he is still experiencing a post-extubation dysphagia, will plan to proceed with FEES this afternoon to better evaluate oropharyngeal swallow and assist in determining if PO diet and/or meds can be initiated. Plan discussed with pt/wife, MD, nursing, and pharmacist who are all in agreement.     HPI HPI: 65 y/o male presented to ED 12/9 with flu/fever/dyspnea; desaturated 12/12 requiring bipap then intubation; extubated 12/22.  PMHx of active smoker, anxiety, CAD, permanent afib,  COPD, DMII, HTN, HHLD, OSA. Nov 2019 EGD with dilation of low grade Schatzi's ring.      SLP Plan  Other (Comment) (FEES)      Recommendations for follow up therapy are one component of a multi-disciplinary discharge planning process, led by the attending physician.  Recommendations may be updated based on patient status, additional functional criteria and insurance authorization.    Recommendations  Diet recommendations: NPO;Other(comment) (few ice chips after oral care pending FEES)                Oral Care Recommendations: Oral care QID;Oral care prior to ice chip/H20 Follow Up Recommendations: Acute inpatient rehab (3hours/day) SLP Visit Diagnosis: Dysphagia, pharyngeal phase (R13.13) Plan: Other (Comment) (FEES)           Osie Bond., M.A. [facility] Pager [phone] Office  [phone]  08/20/2021, 11:47 AM

## 2021-08-20 NOTE — Evaluation (Signed)
Physical Therapy Evaluation Patient Details Name: Shaun [name] MRN: [id number] DOB: 11/29/1955 Today's Date: 08/20/2021  History of Present Illness  65 y/o male presented to ED 12/9 with flu/fever/dyspnea; desaturated 12/12 requiring bipap then intubation; extubated 12/22. Rash on body. PMHx of active smoker, anxiety, CAD, permanent afib,  COPD, DMII, HTN, HHLD, OSA. Nov 2019 EGD with dilation of low grade Schatzi's ring.   Clinical Impression  Pt in bed upon arrival of PT, agreeable to evaluation at this time. Prior to admission the pt was independent without need for DME, reports he is independent with all ADLs, and enjoys wood working. The pt now presents with limitations in functional mobility, strength, muscular endurance, power, coordination, and stability due to above dx, and will continue to benefit from skilled PT to address these deficits. The pt was able to demo good initiation of movements with LE and UE against gravity in bed, but required significant assist to coordinate movements of all extremities and core to manage bed mobility or sitting EOB. The pt required constant assist to maintain static sitting, and totalA of 2 to complete sit-stand and stand-pivot to recliner at this time due to poor trunk control, strength, and power in LE. Given the pt's prior level of mobility and independence as well as good family support, recommend acute inpatient rehab after d/c to maximize functional recovery.         Recommendations for follow up therapy are one component of a multi-disciplinary discharge planning process, led by the attending physician.  Recommendations may be updated based on patient status, additional functional criteria and insurance authorization.  Follow Up Recommendations Acute inpatient rehab (3hours/day)    Assistance Recommended at Discharge Frequent or constant Supervision/Assistance  Functional Status Assessment Patient has had a recent decline in their  functional status and demonstrates the ability to make significant improvements in function in a reasonable and predictable amount of time.  Equipment Recommendations  Other (comment) (defer to post acute)    Recommendations for Other Services Rehab consult     Precautions / Restrictions Precautions Precautions: Fall Restrictions Weight Bearing Restrictions: No      Mobility  Bed Mobility Overal bed mobility: Needs Assistance Bed Mobility: Rolling;Sidelying to Sit Rolling: Mod assist;+2 for physical assistance Sidelying to sit: Mod assist;+2 for physical assistance       General bed mobility comments: A at legs and trunk    Transfers Overall transfer level: Needs assistance Equipment used: 2 person hand held assist Transfers: Bed to chair/wheelchair/BSC       Squat pivot transfers: Total assist;+2 physical assistance     General transfer comment: use of bed pad and gait belt for transfer, pt able to bare ~1/2 his weight on legs but unable to pivot    Ambulation/Gait               General Gait Details: pt unable to generate steps at this time      Balance Overall balance assessment: Needs assistance Sitting-balance support: Bilateral upper extremity supported;Feet supported Sitting balance-Leahy Scale: Zero Sitting balance - Comments: Pt not really attemptimg to hold himself up with his arms despite he does have some strength in them (espcially elbow distally), limited in shouder ROM pta Postural control: Posterior lean;Right lateral lean Standing balance support: Bilateral upper extremity supported Standing balance-Leahy Scale: Zero Standing balance comment: unable to achieve full standing with +2 A  Pertinent Vitals/Pain Pain Assessment: No/denies pain    Home Living Family/patient expects to be discharged to:: Inpatient rehab Living Arrangements: Spouse/significant other Available Help at Discharge:  Family;Available 24 hours/day Type of Home: House Home Access: Stairs to enter   [employer] of Steps: 1   Home Layout: One level Home Equipment: Cane - single point Additional Comments: Does wood working in his shop, could be up and about walking 50 yards before he got tired.    Prior Function Prior Level of Function : Independent/Modified Independent;Driving             Mobility Comments: pt mobilizing ~50 yards at a time before he gets fatigued, no DME ADLs Comments: pt reports independent but unable to complete UE dressing due to rotator cuff issues     Hand Dominance   Dominant Hand: Right    Extremity/Trunk Assessment   Upper Extremity Assessment Upper Extremity Assessment: Defer to OT evaluation RUE Deficits / Details: prior rotator cuff issues RUE Coordination: decreased gross motor LUE Deficits / Details: prior rotator cuff issues LUE Coordination: decreased gross motor    Lower Extremity Assessment Lower Extremity Assessment: Generalized weakness (pt able to move and hold angainst gravity, poor functional power and muscular endurance)    Cervical / Trunk Assessment Cervical / Trunk Assessment: Other exceptions;Kyphotic Cervical / Trunk Exceptions: significant truncal weakness  Communication   Communication: No difficulties  Cognition Arousal/Alertness: Awake/alert Behavior During Therapy: WFL for tasks assessed/performed Overall Cognitive Status: Impaired/Different from baseline                                 General Comments: When asked initially if he had any problems with his shoulders he said no, but as we talked and worked with him he did report he had Bil rotator cuff issues        General Comments General comments (skin integrity, edema, etc.): VSS on 6L    Exercises     Assessment/Plan    PT Assessment Patient needs continued PT services  PT Problem List Decreased strength;Decreased range of motion;Decreased  activity tolerance;Decreased balance;Decreased mobility       PT Treatment Interventions DME instruction;Stair training;Gait training;Functional mobility training;Therapeutic activities;Therapeutic exercise;Balance training;Patient/family education    PT Goals (Current goals can be found in the Care Plan section)  Acute Rehab PT Goals Patient Stated Goal: return to working in his workshop (wood working) PT Goal Formulation: With patient Time For Goal Achievement: 09/03/21 Potential to Achieve Goals: Good    Frequency Min 3X/week   Barriers to discharge        Co-evaluation PT/OT/SLP Co-Evaluation/Treatment: Yes Reason for Co-Treatment: Necessary to address cognition/behavior during functional activity;For patient/therapist safety;To address functional/ADL transfers PT goals addressed during session: Mobility/safety with mobility;Balance;Proper use of DME OT goals addressed during session: Strengthening/ROM;ADL's and self-care       AM-PAC PT "6 Clicks" Mobility  Outcome Measure Help needed turning from your back to your side while in a flat bed without using bedrails?: A Lot Help needed moving from lying on your back to sitting on the side of a flat bed without using bedrails?: A Lot Help needed moving to and from a bed to a chair (including a wheelchair)?: Total Help needed standing up from a chair using your arms (e.g., wheelchair or bedside chair)?: Total Help needed to walk in hospital room?: Total Help needed climbing 3-5 steps with a railing? : Total 6 Click  Score: 8    End of Session Equipment Utilized During Treatment: Gait belt;Oxygen Activity Tolerance: Patient limited by fatigue Patient left: in chair;with call bell/phone within reach;with family/visitor present Nurse Communication: Mobility status PT Visit Diagnosis: Other abnormalities of gait and mobility (R26.89);Muscle weakness (generalized) (M62.81)    Time: 6063-0160 PT Time Calculation (min) (ACUTE  ONLY): 27 min   Charges:   PT Evaluation $PT Eval Moderate Complexity: 1 Mod          [place] Carbo, PT, DPT   Acute Rehabilitation Department Pager #: [phone]  Sandra Cockayne 08/20/2021, 11:42 AM

## 2021-08-20 NOTE — Progress Notes (Signed)
RN aware of DC central line removal

## 2021-08-20 NOTE — Procedures (Signed)
Objective Swallowing Evaluation: Type of Study: FEES-Fiberoptic Endoscopic Evaluation of Swallow   Patient Details  Name: Shaun [name] MRN: [id number] Date of Birth: [birth year]  Today's Date: 08/20/2021 Time: SLP Start Time (ACUTE ONLY): 1352 -SLP Stop Time (ACUTE ONLY): 1424  SLP Time Calculation (min) (ACUTE ONLY): 32 min   Past Medical History:  Past Medical History:  Diagnosis Date   Anemia    Anxiety    Asthma    CAD (coronary artery disease)    DES to distal circumflex 2016   Cataract    Colon polyps    30 colon polyps found on first colonoscopy   Diastolic heart failure (HCC)    Diverticulitis    DJD (degenerative joint disease)    GERD (gastroesophageal reflux disease)    History of kidney stones    Hyperlipidemia    Hypertension    Insomnia    Obstructive sleep apnea 12/2009   01/26/2010 AHI 83/hr   Permanent atrial fibrillation ([place])    Onset 2006 paroxysmal then progressive to persistent   PUD (peptic ulcer disease)    1980s   RLS (restless legs syndrome)    Sinusitis    Skin cancer    Type 2 diabetes mellitus ([place])    Past Surgical History:  Past Surgical History:  Procedure Laterality Date   BIOPSY  07/17/2018   Procedure: BIOPSY;  Surgeon: Danie Binder, MD;  [facility];  Service: Endoscopy;;  colon   BOWEL RESECTION  09/17/2018   SMALL BOWEL RESECTION: 71 CM    CARDIAC CATHETERIZATION N/A 07/21/2015   Procedure: Left Heart Cath and Coronary Angiography;  Surgeon: Peter M Martinique, MD;  Location: [place] [facility];  Service: Cardiovascular;  Laterality: N/A;   CARDIAC CATHETERIZATION N/A 07/21/2015   Procedure: Coronary Stent Intervention;  Surgeon: Peter M Martinique, MD;  Location: [place] [facility];  Service: Cardiovascular;  Laterality: N/A;   CIRCUMCISION N/A 04/05/2019   Procedure: CIRCUMCISION ADULT;  Surgeon: Irine Seal, MD;  Location: [facility];  Service: Urology;  Laterality: N/A;   COLONOSCOPY N/A 05/19/2014   Dr. Barnie Alderman  diverticulosis/moderate external hemorrhoids, >20 simple adenomas. Genetic screening negative.    COLONOSCOPY WITH PROPOFOL N/A 07/17/2018   Dr. Oneida Alar: Diverticulosis, external/internal hemorrhoids, 32 colon polyps removed.  ten tubular adenomas removed with no high-grade dysplasia.  Advised to have surveillance colonoscopy in 3 years.   COLONOSCOPY WITH PROPOFOL N/A 06/07/2021   Procedure: COLONOSCOPY WITH PROPOFOL;  Surgeon: Eloise Harman, DO;  [facility];  Service: Endoscopy;  Laterality: N/A;  9:30 / ASA 3  (Pt was told that his time will be given at Pre-op)   ESOPHAGOGASTRODUODENOSCOPY (EGD) WITH PROPOFOL N/A 07/17/2018   Dr. Oneida Alar: Low-grade narrowing Schatzki ring at the GE junction status post dilation.  Gastritis.  Biopsy with mild nonspecific reactive gastropathy.  No H. pylori.   GIVENS CAPSULE STUDY N/A 06/24/2019   normal   HERNIA REPAIR  1986   Left inguinal   INTRAVASCULAR PRESSURE WIRE/FFR STUDY Left 06/08/2017   Procedure: INTRAVASCULAR PRESSURE WIRE/FFR STUDY;  Surgeon: Nelva Bush, MD;  Location: [place] [facility];  Service: Cardiovascular;  Laterality: Left;  LAD and CFX   LAPAROTOMY N/A 09/17/2018   Procedure: EXPLORATORY LAPAROTOMY;  Surgeon: Virl Cagey, MD;  Location: [facility];  Service: General;  Laterality: N/A;   LEFT HEART CATH AND CORONARY ANGIOGRAPHY N/A 06/08/2017   Procedure: LEFT HEART CATH AND CORONARY ANGIOGRAPHY;  Surgeon: Nelva Bush, MD;  Location:  [facility];  Service: Cardiovascular;  Laterality: N/A;   POLYPECTOMY  07/17/2018   Procedure: POLYPECTOMY;  Surgeon: Danie Binder, MD;  [facility];  Service: Endoscopy;;  colon   POLYPECTOMY  06/07/2021   Procedure: POLYPECTOMY INTESTINAL;  Surgeon: Eloise Harman, DO;  [facility];  Service: Endoscopy;;   ROTATOR CUFF REPAIR     Right   SAVORY DILATION N/A 07/17/2018   Procedure: SAVORY DILATION;  Surgeon: Danie Binder, MD;  [facility];  Service: Endoscopy;  Laterality: N/A;   HPI: 65 y/o male presented to ED 12/9 with flu/fever/dyspnea; desaturated 12/12 requiring bipap then intubation; extubated 12/22.  PMHx of active smoker, anxiety, CAD, permanent afib,  COPD, DMII, HTN, HHLD, OSA. Nov 2019 EGD with dilation of low grade Schatzi's ring.   Subjective: pt alert, pleasant    Recommendations for follow up therapy are one component of a multi-disciplinary discharge planning process, led by the attending physician.  Recommendations may be updated based on patient status, additional functional criteria and insurance authorization.  Assessment / Plan / Recommendation  Clinical Impressions 08/20/2021  Clinical Impression Pt presents with a pharyngeal dysphagia with anatomical changes to the true vocal folds that are suspected to be related to presence of ETT. The vocal folds have a bilateral indentation, although with two areas of bowing on the L true vocal fold and only one area on the R. Pt appears to have functional timing for swallow initiation but he has generalized weakness that results in mild but diffuse residue and incomplete laryngeal vestibule closure. As a result, pt has consistent penetration to the vocal folds with nectar thick liquids and larger volumes of honey thick liquids. Cued coughs and multiple swallows help to reduce penetration and residue, but given the amount that is present around the opening of the airway, as well as limited visibility of the subglottis, aspiration cannot be ruled out. With spoonfuls of honey thick liquids and purees, penetration primarily occurs along the outer rim of the laryngeal vestibule, and his cough is more efficient at consistently expelling it. Recommend starting with snacks of purees and honey thick liquids by spoon, using hard swallows and volitional coughing to try to increase airway protection while still allowing him to utilize his swallowing musculature. Anticipate  good prognosis for return to PO diet, particularly as his strength and endurance improve, although he will also likely need time for his true vocal folds to normalize. Discussed findings and recommendaitons with pharmacist and MD, as well as consideration for Cortrak for more adequate nutrition.  SLP Visit Diagnosis Dysphagia, pharyngeal phase (R13.13)  Attention and concentration deficit following --  Frontal lobe and executive function deficit following --  Impact on safety and function Moderate aspiration risk;Risk for inadequate nutrition/hydration      Treatment Recommendations 08/20/2021  Treatment Recommendations Therapy as outlined in treatment plan below     Prognosis 08/20/2021  Prognosis for Safe Diet Advancement Good  Barriers to Reach Goals --  Barriers/Prognosis Comment --    Diet Recommendations 08/20/2021  SLP Diet Recommendations NPO;Alternative means - temporary;Other (Comment)  Liquid Administration via Spoon  Medication Administration Crushed with puree  Compensations Slow rate;Small sips/bites;Multiple dry swallows after each bite/sip;Hard cough after swallow  Postural Changes Seated upright at 90 degrees      Other Recommendations 08/20/2021  Recommended Consults --  Oral Care Recommendations Oral care QID  Other Recommendations Order thickener from pharmacy;Prohibited food (jello, ice cream, thin soups);Remove  water pitcher;Have oral suction available  Follow Up Recommendations Acute inpatient rehab (3hours/day)  Assistance recommended at discharge Intermittent Supervision/Assistance  Functional Status Assessment Patient has had a recent decline in their functional status and demonstrates the ability to make significant improvements in function in a reasonable and predictable amount of time.    Frequency and Duration  08/20/2021  Speech Therapy Frequency (ACUTE ONLY) min 3x week  Treatment Duration 2 weeks      Oral Phase 08/20/2021  Oral Phase WFL  Oral  - Pudding Teaspoon --  Oral - Pudding Cup --  Oral - Honey Teaspoon --  Oral - Honey Cup --  Oral - Nectar Teaspoon --  Oral - Nectar Cup --  Oral - Nectar Straw --  Oral - Thin Teaspoon --  Oral - Thin Cup --  Oral - Thin Straw --  Oral - Puree --  Oral - Mech Soft --  Oral - Regular --  Oral - Multi-Consistency --  Oral - Pill --  Oral Phase - Comment --    Pharyngeal Phase 08/20/2021  Pharyngeal Phase Impaired  Pharyngeal- Pudding Teaspoon --  Pharyngeal --  Pharyngeal- Pudding Cup --  Pharyngeal --  Pharyngeal- Honey Teaspoon Reduced pharyngeal peristalsis;Reduced epiglottic inversion;Reduced anterior laryngeal mobility;Reduced laryngeal elevation;Reduced airway/laryngeal closure;Reduced tongue base retraction;Pharyngeal residue - valleculae;Pharyngeal residue - pyriform;Lateral channel residue;Pharyngeal residue - posterior pharnyx;Penetration/Aspiration during swallow  Pharyngeal Material enters airway, remains ABOVE vocal cords and not ejected out  Pharyngeal- Honey Cup Reduced pharyngeal peristalsis;Reduced epiglottic inversion;Reduced anterior laryngeal mobility;Reduced laryngeal elevation;Reduced airway/laryngeal closure;Reduced tongue base retraction;Pharyngeal residue - valleculae;Pharyngeal residue - pyriform;Lateral channel residue;Pharyngeal residue - posterior pharnyx;Penetration/Aspiration during swallow  Pharyngeal Material enters airway, CONTACTS cords and not ejected out  Pharyngeal- Nectar Teaspoon Reduced pharyngeal peristalsis;Reduced epiglottic inversion;Reduced anterior laryngeal mobility;Reduced laryngeal elevation;Reduced airway/laryngeal closure;Reduced tongue base retraction;Pharyngeal residue - valleculae;Pharyngeal residue - pyriform;Lateral channel residue;Pharyngeal residue - posterior pharnyx;Penetration/Aspiration during swallow  Pharyngeal Material enters airway, CONTACTS cords and not ejected out  Pharyngeal- Nectar Cup Reduced pharyngeal  peristalsis;Reduced epiglottic inversion;Reduced anterior laryngeal mobility;Reduced laryngeal elevation;Reduced airway/laryngeal closure;Reduced tongue base retraction;Pharyngeal residue - valleculae;Pharyngeal residue - pyriform;Lateral channel residue;Pharyngeal residue - posterior pharnyx;Penetration/Aspiration during swallow  Pharyngeal Material enters airway, CONTACTS cords and not ejected out  Pharyngeal- Nectar Straw --  Pharyngeal --  Pharyngeal- Thin Teaspoon --  Pharyngeal --  Pharyngeal- Thin Cup --  Pharyngeal --  Pharyngeal- Thin Straw --  Pharyngeal --  Pharyngeal- Puree Reduced pharyngeal peristalsis;Reduced epiglottic inversion;Reduced anterior laryngeal mobility;Reduced laryngeal elevation;Reduced airway/laryngeal closure;Reduced tongue base retraction;Pharyngeal residue - valleculae;Pharyngeal residue - pyriform;Lateral channel residue;Pharyngeal residue - posterior pharnyx;Penetration/Aspiration during swallow  Pharyngeal Material enters airway, remains ABOVE vocal cords and not ejected out  Pharyngeal- Mechanical Soft --  Pharyngeal --  Pharyngeal- Regular --  Pharyngeal --  Pharyngeal- Multi-consistency --  Pharyngeal --  Pharyngeal- Pill --  Pharyngeal --  Pharyngeal Comment --     Cervical Esophageal Phase  08/20/2021  Cervical Esophageal Phase WFL  Pudding Teaspoon --  Pudding Cup --  Honey Teaspoon --  Honey Cup --  Nectar Teaspoon --  Nectar Cup --  Nectar Straw --  Thin Teaspoon --  Thin Cup --  Thin Straw --  Puree --  Mechanical Soft --  Regular --  Multi-consistency --  Pill --  Cervical Esophageal Comment --     Osie Bond., M.A. [facility] Pager [phone] Office [phone]  08/20/2021, 2:44 PM

## 2021-08-20 NOTE — Progress Notes (Signed)
RT placed patient on CPAP HS. 6L O2 bleed in needed. Patient tolerating well at this time.

## 2021-08-20 NOTE — Evaluation (Signed)
Occupational Therapy Evaluation Patient Details Name: Shaun [name] MRN: [id number] DOB: April 07, 1956 Today's Date: 08/20/2021   History of Present Illness 65 y/o male presented to ED 12/9 with flu/fever/dyspnea; desaturated 12/12 requiring bipap then intubation; extubated 12/22. Rash on body. PMHx of active smoker, anxiety, CAD, permanent afib,  COPD, DMII, HTN, HHLD, OSA. Nov 2019 EGD with dilation of low grade Schatzi's ring.   Clinical Impression   This 65 yo male admitted with above presents to acute OT with PLOF until he got the flu of being totally independent with basic ADLs, IADLs, driving, and working in his woodshop making reindeer. He currently is min-total A for basic ADLs, cannot hold himself up on EOB, is Max A +2 sit<>stand and total A +2 stand pivot. He will continue to benefit from acute OT with follow up on CIR.      Recommendations for follow up therapy are one component of a multi-disciplinary discharge planning process, led by the attending physician.  Recommendations may be updated based on patient status, additional functional criteria and insurance authorization.   Follow Up Recommendations  Acute inpatient rehab (3hours/day)    Assistance Recommended at Discharge Frequent or constant Supervision/Assistance  Functional Status Assessment  Patient has had a recent decline in their functional status and demonstrates the ability to make significant improvements in function in a reasonable and predictable amount of time.  Equipment Recommendations  Other (comment) (TBD next venue)       Precautions / Restrictions Precautions Precautions: Fall Restrictions Weight Bearing Restrictions: No      Mobility Bed Mobility Overal bed mobility: Needs Assistance Bed Mobility: Rolling;Sidelying to Sit Rolling: Mod assist;+2 for physical assistance Sidelying to sit: Mod assist;+2 for physical assistance       General bed mobility comments: A at legs and trunk     Transfers Overall transfer level: Needs assistance   Transfers: Bed to chair/wheelchair/BSC     Squat pivot transfers: Total assist;+2 physical assistance       General transfer comment: use of bed pad and gait belt for transfer, pt able to bare ~1/2 his weight on legs but unable to pivot      Balance Overall balance assessment: Needs assistance Sitting-balance support: Bilateral upper extremity supported;Feet supported Sitting balance-Leahy Scale: Zero Sitting balance - Comments: Pt not really attemptimg to hold himself up with his arms despite he does have some strength in them (espcially elbow distally), limited in shouder ROM pta Postural control: Posterior lean;Right lateral lean Standing balance support: Bilateral upper extremity supported Standing balance-Leahy Scale: Zero Standing balance comment: unable to achieve full standing with +2 A                           ADL either performed or assessed with clinical judgement   ADL Overall ADL's : Needs assistance/impaired Eating/Feeding: NPO   Grooming: Minimal assistance;Bed level   Upper Body Bathing: Moderate assistance;Bed level   Lower Body Bathing: Total assistance;Bed level   Upper Body Dressing : Total assistance;Bed level   Lower Body Dressing: Total assistance;Bed level   Toilet Transfer: Total assistance;+2 for physical assistance;Squat-pivot Toilet Transfer Details (indicate cue type and reason): simulated bed to recliner with use of gait belt and bed pad Toileting- Clothing Manipulation and Hygiene: Total assistance;Bed level               Vision Baseline Vision/History: 1 Wears glasses (bi-focals) Ability to See in Adequate Light: 0 Adequate Patient Visual  Report: No change from baseline              Pertinent Vitals/Pain Pain Assessment: No/denies pain     Hand Dominance Right   Extremity/Trunk Assessment Upper Extremity Assessment Upper Extremity Assessment: RUE  deficits/detail;LUE deficits/detail RUE Deficits / Details: prior rotator cuff issues RUE Coordination: decreased gross motor LUE Deficits / Details: prior rotator cuff issues LUE Coordination: decreased gross motor           Communication Communication Communication: No difficulties   Cognition Arousal/Alertness: Awake/alert Behavior During Therapy: WFL for tasks assessed/performed Overall Cognitive Status: Impaired/Different from baseline                                 General Comments: When asked initially if he had any problems with his shoulders he said no, but as we talked and worked with him he did report he had Bil rotator cuff issues     General Comments  VSS            Home Living Family/patient expects to be discharged to:: Inpatient rehab Living Arrangements: Spouse/significant other Available Help at Discharge: Family;Available 24 hours/day Type of Home: House Home Access: Stairs to enter [employer] of Steps: 1   Home Layout: One level     Bathroom Shower/Tub: Walk-in shower;Door   [employer] Toilet: Standard     Home Equipment: Kasandra Knudsen - single point   Additional Comments: Does wood working in his shop, could be up and about walking 50 yards before he got tired.      Prior Functioning/Environment Prior Level of Function : Independent/Modified Independent;Driving                        OT Problem List: Decreased strength;Decreased range of motion;Impaired balance (sitting and/or standing);Decreased activity tolerance;Decreased safety awareness;Decreased knowledge of use of DME or AE      OT Treatment/Interventions: Self-care/ADL training;Balance training;DME and/or AE instruction;Patient/family education;Therapeutic exercise;Therapeutic activities    OT Goals(Current goals can be found in the care plan section) Acute Rehab OT Goals Patient Stated Goal: to 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 OT Goal  Formulation: With patient/family Time For Goal Achievement: 09/03/21 Potential to Achieve Goals: Good  OT Frequency: Min 2X/week           Co-evaluation PT/OT/SLP Co-Evaluation/Treatment: Yes Reason for Co-Treatment: For patient/therapist safety PT goals addressed during session: Mobility/safety with mobility;Balance;Strengthening/ROM OT goals addressed during session: Strengthening/ROM;ADL's and self-care      AM-PAC OT "6 Clicks" Daily Activity     Outcome Measure Help from another person eating meals?: Total Help from another person taking care of personal grooming?: A Lot Help from another person toileting, which includes using toliet, bedpan, or urinal?: Total Help from another person bathing (including washing, rinsing, drying)?: A Lot Help from another person to put on and taking off regular upper body clothing?: Total Help from another person to put on and taking off regular lower body clothing?: Total 6 Click Score: 8   End of Session Equipment Utilized During Treatment: Gait belt Nurse Communication: Mobility status;Need for lift equipment (maxi sky)  Activity Tolerance: Patient limited by fatigue Patient left: in chair;with call bell/phone within reach  OT Visit Diagnosis: Unsteadiness on feet (R26.81);Other abnormalities of gait and mobility (R26.89);Muscle weakness (generalized) (M62.81)                Time: 9381-0175  OT Time Calculation (min): 33 min Charges:  OT General Charges $OT Visit: 1 Visit OT Evaluation $OT Eval Moderate Complexity: 1 Mod  Golden Circle, OTR/L Acute [employer] Pager [phone] Office [phone]    Almon Register 08/20/2021, 11:20 AM

## 2021-08-20 NOTE — Progress Notes (Signed)
Nutrition Follow-up  DOCUMENTATION CODES:   Obesity unspecified  INTERVENTION:   -RD will follow for diet advancement and add supplements as appropriate -If pt unable to progress to PO diet, consider initiation of enteral nutrition support:  Initiate Osmolite 1.5 @ 25 ml/hr and increase by 10 ml every 4 hours to goal rate of 55 ml/hr.   45 ml Prosource TF BID.  If no IVFS, 150 ml free water flush every 4 hours    Tube feeding regimen provides 2060 kcal (100% of needs), 105 grams of protein, and 1006 ml of H2O.  Total free water: 1906 ml daily  NUTRITION DIAGNOSIS:   Inadequate oral intake related to inability to eat as evidenced by NPO status.  Ongoing  GOAL:   Patient will meet greater than or equal to 90% of their needs  Unmet  MONITOR:   Diet advancement, Labs, Weight trends, Skin, I & O's  REASON FOR ASSESSMENT:   Ventilator    ASSESSMENT:   Patient is a 65 yo with history of CHF, HTN, DM2, anxiety who reports with dyspnea. Patient and spouse have the flu per nursing.  12/22- extubated, s/p BSE- NPO 12/23- s/p FEE- NPO with purees and honey thick liquids via spoon   Reviewed I/O's: +230 ml x 24 hours and -106 ml x 24 hours  UOP: 1.2 L x 24 hours  Per SLP notes, pt with good prognosis to advance to PO diet (dysphagia may be related to ETT). Plan to provide purees and honey thick liquids from floor via spoon. If pt unable to advance to PO diet, consider cortrak tube.   Medications reviewed and include dextrose 5% in lactated ringers infusion @ 50 ml/hr.   Labs reviewed: Na: 147, CBGS: 82-163 (inpatient orders for glycemic control are 0-20 units insulin aspart TID with meals).    Diet Order:   Diet Order             Diet NPO time specified  Diet effective now                   EDUCATION NEEDS:   Not appropriate for education at this time  Skin:  Skin Assessment: Reviewed RN Assessment  Last BM:  08/19/21  Height:   Ht Readings from Last  1 Encounters:  08/13/21 5\' 8"  (1.727 m)    Weight:   Wt Readings from Last 1 Encounters:  08/20/21 94.1 kg   BMI:  Body mass index is 31.54 kg/m.  Estimated Nutritional Needs:   Kcal:  1900-2100  Protein:  105-120 grams  Fluid:  > 1.9 L    Loistine Chance, RD, LDN, [place] Registered Dietitian II Certified Diabetes Care and Education Specialist Please refer to [facility] for RD and/or RD on-call/weekend/after hours pager

## 2021-08-21 ENCOUNTER — Inpatient Hospital Stay (HOSPITAL_COMMUNITY): Payer: 59

## 2021-08-21 DIAGNOSIS — E669 Obesity, unspecified: Secondary | ICD-10-CM | POA: Diagnosis not present

## 2021-08-21 DIAGNOSIS — E119 Type 2 diabetes mellitus without complications: Secondary | ICD-10-CM

## 2021-08-21 DIAGNOSIS — I482 Chronic atrial fibrillation, unspecified: Secondary | ICD-10-CM | POA: Diagnosis not present

## 2021-08-21 DIAGNOSIS — G2581 Restless legs syndrome: Secondary | ICD-10-CM

## 2021-08-21 DIAGNOSIS — J9601 Acute respiratory failure with hypoxia: Secondary | ICD-10-CM | POA: Diagnosis not present

## 2021-08-21 LAB — BASIC METABOLIC PANEL
Anion gap: 8 (ref 5–15)
BUN: 41 mg/dL — ABNORMAL HIGH (ref 8–23)
CO2: 28 mmol/L (ref 22–32)
Calcium: 8.2 mg/dL — ABNORMAL LOW (ref 8.9–10.3)
Chloride: 111 mmol/L (ref 98–111)
Creatinine, Ser: 0.85 mg/dL (ref 0.61–1.24)
GFR, Estimated: >60 mL/min (ref >60)
Glucose, Bld: 172 mg/dL — ABNORMAL HIGH (ref 70–99)
Potassium: 4.1 mmol/L (ref 3.5–5.1)
Sodium: 147 mmol/L — ABNORMAL HIGH (ref 135–145)

## 2021-08-21 LAB — GLUCOSE, CAPILLARY
Glucose-Capillary: 130 mg/dL — ABNORMAL HIGH (ref 70–99)
Glucose-Capillary: 183 mg/dL — ABNORMAL HIGH (ref 70–99)
Glucose-Capillary: 185 mg/dL — ABNORMAL HIGH (ref 70–99)
Glucose-Capillary: 188 mg/dL — ABNORMAL HIGH (ref 70–99)
Glucose-Capillary: 206 mg/dL — ABNORMAL HIGH (ref 70–99)
Glucose-Capillary: 227 mg/dL — ABNORMAL HIGH (ref 70–99)

## 2021-08-21 MED ORDER — LEVALBUTEROL HCL 1.25 MG/0.5ML IN NEBU
1.2500 mg | INHALATION_SOLUTION | Freq: Four times a day (QID) | RESPIRATORY_TRACT | Status: DC | PRN
  Filled 2021-08-21: qty 0.5

## 2021-08-21 MED ORDER — LEVALBUTEROL HCL 1.25 MG/0.5ML IN NEBU
1.2500 mg | INHALATION_SOLUTION | Freq: Two times a day (BID) | RESPIRATORY_TRACT | Status: DC
  Administered 2021-08-21 – 2021-08-25 (×6): 1.25 mg via RESPIRATORY_TRACT
  Filled 2021-08-21 (×9): qty 0.5

## 2021-08-21 NOTE — Plan of Care (Signed)

## 2021-08-21 NOTE — Assessment & Plan Note (Signed)
Counseled.  Nicotine patch ?

## 2021-08-21 NOTE — Hospital Course (Addendum)
65 year old smoker with history of anxiety, CHF, obesity, diabetes mellitus, obstructive sleep apnea and hypertension and diagnosis of flu 1 week prior admitted for worsening shortness of breath.  Initially admitted to hospitalist service, but within 24 hours, patient started to decompensate was intubated and placed on ventilator by 12/12.  Also required pressors for septic shock.  At that time also went into rapid atrial fibrillation.  Placed on antibiotics for concern for post fluid bacterial pneumonia.  Patient able to be weaned off of pressors.  [facility]17 from [facility].  Extubated on 12/21 and down to 3.5 L nasal cannula at 98%.  Seen by cardiology and had been on IV amiodarone, changed to p.o. metoprolol following extubation.  Apixaban started.  Transferred to hospitalist service starting 12/24.  Physical therapy recommending inpatient rehab, although it looks like insurance will not cover this.  Possible skilled nursing versus home health.    In the interim, found to have some dysphagia, secondary to deconditioning.  Has had NG tube placed and Osmolite initiated on 12/25.  Not tolerating NG tube and it was removed.  Therapy did modified barium swallow and patient on 12/27 and swelling has since improved and restrictions discontinued.  Patient developed some shortness of breath on 12/25 and found to be mildly volume overloaded and responded to IV Lasix.

## 2021-08-21 NOTE — Progress Notes (Signed)
Inpatient Rehab Admissions Coordinator:   I spoke with pt.'s wife regarding potential CIR admit. She states interest and that she is able to work from home and provide 24/7 support at discharge. She did note that his insurance is to change 08/29/2021 to [place]. I will pursue for CIR admission pending insurance auth and bed availability and clarify if insurance change will be an issue.  Clemens Catholic, [place], [place] Admissions Coordinator  [phone] ([place]) [phone] (office)

## 2021-08-21 NOTE — Progress Notes (Signed)
[facility] Progress Note  Patient: Shaun [name]    JEH:[id number]  DOA: 08/06/2021    Date of Service: the patient was seen and examined on 08/21/2021  Brief hospital course: 65 year old smoker with history of anxiety, CHF, obesity, diabetes mellitus, obstructive sleep apnea and hypertension and diagnosis of flu 1 week prior admitted for worsening shortness of breath.  Initially admitted to hospitalist service, but within 24 hours, patient started to decompensate was intubated and placed on ventilator by 12/12.  Also required pressors for septic shock.  At that time also went into rapid atrial fibrillation.  Placed on antibiotics for concern for post fluid bacterial pneumonia.  Patient able to be weaned off of pressors.  [facility] to [facility] ICU on 12/17 from [facility].  Extubated on 12/21 and currently on 6 L nasal cannula at 98%.  Seen by cardiology and had been on IV amiodarone, changed to p.o. metoprolol following extubation.  Apixaban started.  Transferred to hospitalist service starting 12/24.  Seen by physical therapy who are recommending inpatient rehab.  Assessment and Plan: Cardiovascular and Mediastinum Chronic diastolic CHF (congestive heart failure) (HCC) Assessment & Plan Euvolemic.  BNP at 26  Essential hypertension Assessment & Plan Blood pressure stable.  Chronic atrial fibrillation (HCC) Assessment & Plan Rate controlled.  Now metoprolol.  Lovenox changed to apixaban  Respiratory Influenza Assessment & Plan Improving.  On Tamiflu.  COPD (chronic obstructive pulmonary disease) ([place]) Assessment & Plan Stable at this time.  Continue nebulizers, oxygen and steroids  Obstructive sleep apnea Assessment & Plan Continue nightly CPAP  * Acute respiratory failure with hypoxemia [facility]) Assessment & Plan Improving.  Extubated, down from 6 L to 3.5 currently  Endocrine Non-insulin treated type 2 diabetes mellitus ([place]) Assessment & Plan CBG stable, under  200  Other Obesity (BMI 30-39.9) Assessment & Plan Meets criteria for BMI greater than 30  Endotracheally intubated Assessment & Plan Off of ventilator.  RLS (restless legs syndrome) Assessment & Plan Have restarted Requip and trazodone at night.  Tobacco abuse Assessment & Plan Counseled.  Nicotine patch.    Body mass index is 31.71 kg/m.  Nutrition Problem: Inadequate oral intake Etiology: inability to eat     Consultants: Critical care Cardiology  Procedures: On ventilator 12/12-12/21 Echocardiogram: Preserved ejection fraction, indeterminate diastolic function  Antimicrobials: Completed course of Tamiflu  Code Status: Full code   Subjective: Patient complains of restless legs.  Breathing easier  Objective: Vital signs were reviewed and unremarkable. Vitals:   08/21/21 1159 08/21/21 1634  BP: 105/72 107/69  Pulse: 81 82  Resp: 20 18  Temp: 97.7 F (36.5 C) 98.3 F (36.8 C)  SpO2: 97% 95%    Intake/Output Summary (Last 24 hours) at 08/21/2021 1756 Last data filed at 08/21/2021 1746 Gross per 24 hour  Intake 724.5 ml  Output 700 ml  Net 24.5 ml   Filed Weights   08/19/21 0200 08/20/21 0500 08/21/21 0500  Weight: 91.3 kg 94.1 kg 94.6 kg   Body mass index is 31.71 kg/m.  Exam:  General: Alert and oriented x3, no acute distress HEENT: Normocephalic and atraumatic, mucous membranes are moist Cardiovascular: Irregular rhythm, rate controlled Respiratory: Decreased breath sounds throughout Abdomen: Soft, nontender, nondistended, positive bowel sounds Musculoskeletal: No clubbing or cyanosis or edema Skin: No skin breaks, tears or lesions Psychiatry: Appropriate, no evidence of psychoses Neurology: No focal deficits  Data Reviewed: My review of labs, imaging, notes and other tests shows no new significant findings.   Disposition:  Status is: Inpatient  Remains inpatient appropriate because: Still requiring supplemental oxygen.  Needs  inpatient rehab   Family Communication: Wife at bedside DVT Prophylaxis: SCDs Start: 08/07/21 0018 apixaban (ELIQUIS) tablet 5 mg    Author: Annita Brod ,MD 08/21/2021 5:56 PM  To reach On-call, see care teams to locate the attending and reach out via [email or web address]. Between 7PM-7AM, please contact night-coverage If you still have difficulty reaching the attending provider, please page the [facility] (Director on Call) for [facility] on amion for assistance.

## 2021-08-21 NOTE — Assessment & Plan Note (Signed)
Stable at this time.  Continue nebulizers, oxygen and steroids

## 2021-08-21 NOTE — Assessment & Plan Note (Signed)
>>  ASSESSMENT AND PLAN FOR TOBACCO USE DISORDER WRITTEN ON 08/21/2021  5:52 PM BY Hollice Espy, MD  Counseled.  Nicotine patch.

## 2021-08-21 NOTE — Assessment & Plan Note (Addendum)
Improving.  Extubated, continues to improve, currently down to 2 L nasal cannula.  Some of this has been in response to diuresing.  Try to wean off today.

## 2021-08-21 NOTE — Assessment & Plan Note (Signed)
CBG stable, under 200

## 2021-08-21 NOTE — Progress Notes (Signed)
RT NOTE:  Pt currently has NG tube in place. This will prevent nasal CPAP mask from sealing to face. This was explained to patient and he decided to wear oxygen via [place] tonight.

## 2021-08-21 NOTE — Assessment & Plan Note (Addendum)
BNP on admission normal, although with diastolic failure, not always elevated.  Episodes on the night of 12/25 of shortness of breath and cough and with Lasix, significantly improved.  Continue few more doses of IV Lasix

## 2021-08-21 NOTE — Assessment & Plan Note (Addendum)
Improving.  Finished course of Tamiflu.

## 2021-08-21 NOTE — Assessment & Plan Note (Signed)
Blood pressure stable ? ?

## 2021-08-21 NOTE — Assessment & Plan Note (Signed)
Meets criteria for BMI greater than 30 

## 2021-08-21 NOTE — Assessment & Plan Note (Addendum)
Have restarted Requip and trazodone at night.  Patient having improvement

## 2021-08-21 NOTE — Assessment & Plan Note (Signed)
-  Continue nightly CPAP °

## 2021-08-21 NOTE — Assessment & Plan Note (Signed)
Off of ventilator.

## 2021-08-21 NOTE — Assessment & Plan Note (Signed)
Rate controlled.  Now metoprolol.  Lovenox changed to apixaban

## 2021-08-21 NOTE — Progress Notes (Signed)
NG Tube noted to be in fundus of stomach , tube advanced 1 inch per Dr. [place] Pink request. Patient tolerated well.

## 2021-08-22 ENCOUNTER — Inpatient Hospital Stay (HOSPITAL_COMMUNITY): Payer: 59

## 2021-08-22 DIAGNOSIS — R1310 Dysphagia, unspecified: Secondary | ICD-10-CM

## 2021-08-22 DIAGNOSIS — E870 Hyperosmolality and hypernatremia: Secondary | ICD-10-CM | POA: Diagnosis not present

## 2021-08-22 DIAGNOSIS — J9601 Acute respiratory failure with hypoxia: Secondary | ICD-10-CM | POA: Diagnosis not present

## 2021-08-22 DIAGNOSIS — I5032 Chronic diastolic (congestive) heart failure: Secondary | ICD-10-CM | POA: Diagnosis not present

## 2021-08-22 DIAGNOSIS — I482 Chronic atrial fibrillation, unspecified: Secondary | ICD-10-CM | POA: Diagnosis not present

## 2021-08-22 HISTORY — DX: Hyperosmolality and hypernatremia: E87.0

## 2021-08-22 LAB — BASIC METABOLIC PANEL
Anion gap: 6 (ref 5–15)
BUN: 27 mg/dL — ABNORMAL HIGH (ref 8–23)
CO2: 27 mmol/L (ref 22–32)
Calcium: 7.8 mg/dL — ABNORMAL LOW (ref 8.9–10.3)
Chloride: 108 mmol/L (ref 98–111)
Creatinine, Ser: 0.76 mg/dL (ref 0.61–1.24)
GFR, Estimated: >60 mL/min (ref >60)
Glucose, Bld: 159 mg/dL — ABNORMAL HIGH (ref 70–99)
Potassium: 3.2 mmol/L — ABNORMAL LOW (ref 3.5–5.1)
Sodium: 141 mmol/L (ref 135–145)

## 2021-08-22 LAB — GLUCOSE, CAPILLARY
Glucose-Capillary: 217 mg/dL — ABNORMAL HIGH (ref 70–99)
Glucose-Capillary: 168 mg/dL — ABNORMAL HIGH (ref 70–99)
Glucose-Capillary: 145 mg/dL — ABNORMAL HIGH (ref 70–99)
Glucose-Capillary: 143 mg/dL — ABNORMAL HIGH (ref 70–99)
Glucose-Capillary: 145 mg/dL — ABNORMAL HIGH (ref 70–99)
Glucose-Capillary: 133 mg/dL — ABNORMAL HIGH (ref 70–99)

## 2021-08-22 MED ORDER — POTASSIUM CHLORIDE CRYS ER 20 MEQ PO TBCR
40.0000 meq | EXTENDED_RELEASE_TABLET | Freq: Once | ORAL | Status: AC
  Administered 2021-08-22: 18:00:00 40 meq via ORAL
  Filled 2021-08-22: qty 2

## 2021-08-22 MED ORDER — OSMOLITE 1.5 CAL PO LIQD
1000.0000 mL | ORAL | Status: DC
  Administered 2021-08-22: 15:00:00 1000 mL
  Filled 2021-08-22: qty 1000

## 2021-08-22 MED ORDER — SODIUM CHLORIDE 0.9 % IV SOLN: INTRAVENOUS | Status: DC | PRN

## 2021-08-22 MED ORDER — ROPINIROLE HCL 0.5 MG PO TABS
1.0000 mg | ORAL_TABLET | Freq: Every day | ORAL | Status: DC
  Administered 2021-08-22 – 2021-08-24 (×3): 1 mg via ORAL
  Filled 2021-08-22 (×3): qty 2

## 2021-08-22 MED ORDER — TRAZODONE HCL 100 MG PO TABS
100.0000 mg | ORAL_TABLET | Freq: Every evening | ORAL | Status: DC | PRN
  Administered 2021-08-22 – 2021-08-24 (×3): 100 mg via ORAL
  Filled 2021-08-22 (×3): qty 1

## 2021-08-22 NOTE — Progress Notes (Addendum)
Patient and wife at bedside is concerned that the NGT is in the wrong place because the patient is coughing more than he was after they started the feeding. Patient also has some SOB. Patient is on 3L/97% on nasal cannula, HOB currently elevated and TF on HOLD. Rush Landmark, NP made aware. STAT Chest X-ray was ordered. Will continue to close monitor patient.  0016 Xray results are in. Notified X. Blount,NP. New orders received. D/C IV fluids, Lasix 40mg  IV X1 and 35meq K+, administered. Patient wants to remain off of the TF. NP made aware.   Call bell within reach and will continue to close monitor. Wife at bedside.

## 2021-08-22 NOTE — Assessment & Plan Note (Addendum)
Due to overall deconditioning, time on ventilator.  Seen by speech therapy and initially patient placed on restrictions.  NG tube placed on 12/24 started Osmolite.  However, patient could not tolerate and tube was discontinued on 12/26.  Speech therapy evaluated patient on 12/27 and swallowing much improved.  Dietary restrictions have been discontinued.

## 2021-08-22 NOTE — Progress Notes (Signed)
[facility] Progress Note  Patient: Shaun [name]    KCL:[id number]  DOA: 08/06/2021    Date of Service: the patient was seen and examined on 08/22/2021  Brief hospital course: 65 year old smoker with history of anxiety, CHF, obesity, diabetes mellitus, obstructive sleep apnea and hypertension and diagnosis of flu 1 week prior admitted for worsening shortness of breath.  Initially admitted to hospitalist service, but within 24 hours, patient started to decompensate was intubated and placed on ventilator by 12/12.  Also required pressors for septic shock.  At that time also went into rapid atrial fibrillation.  Placed on antibiotics for concern for post fluid bacterial pneumonia.  Patient able to be weaned off of pressors.  [facility] 12[facility]17 from [facility].  Extubated on 12/21 and down to 3.5 L nasal cannula at 98%.  Seen by cardiology and had been on IV amiodarone, changed to p.o. metoprolol following extubation.  Apixaban started.  Transferred to hospitalist service starting 12/24.  Physical therapy recommending inpatient rehab.  Approval pending.  In the interim, found to have some dysphagia, secondary to deconditioning.  Has had NG tube placed and Osmolite initiated on 12/25.  Assessment and Plan: Cardiovascular and Mediastinum Essential hypertension Assessment & Plan Blood pressure stable.  Chronic diastolic CHF (congestive heart failure) (HCC) Assessment & Plan Euvolemic.  BNP at 26  Chronic atrial fibrillation (HCC) Assessment & Plan Rate controlled.  Now metoprolol.  Lovenox changed to apixaban  Respiratory Obstructive sleep apnea Assessment & Plan Continue nightly CPAP  COPD (chronic obstructive pulmonary disease) ([place]) Assessment & Plan Stable at this time.  Continue nebulizers, oxygen and steroids  Influenza Assessment & Plan Improving.  On Tamiflu.  * Acute respiratory failure with hypoxemia [facility]) Assessment & Plan Improving.  Extubated,  down from 6 L to 3.5 currently  Digestive Dysphagia Assessment & Plan Due to overall deconditioning, time on ventilator.  Seen by speech therapy who expects over time, patient should improve.  In the meantime, appreciate nutrition consult.  NG tube placed on 12/24.  Will initiate Osmolite as per nutrition recommendations.  Endocrine Non-insulin treated type 2 diabetes mellitus ([place]) Assessment & Plan CBG stable, under 200  Other Tobacco abuse Assessment & Plan Counseled.  Nicotine patch.  RLS (restless legs syndrome) Assessment & Plan Have restarted Requip and trazodone at night.  Obesity (BMI 30-39.9) Assessment & Plan Meets criteria for BMI greater than 30  Hypernatremia Assessment & Plan Sodium on 12/24 at 147.  Likely from decreased p.o. intake.  Gently hydrating and rechecking labs  Endotracheally intubated Assessment & Plan Off of ventilator.    Body mass index is 31.41 kg/m.  Nutrition Problem: Inadequate oral intake Etiology: inability to eat     Consultants: Critical care Cardiology  Procedures: On ventilator 12/12-12/21 Echocardiogram: Preserved ejection fraction, indeterminate diastolic function  Antimicrobials: Completed course of Tamiflu  Code Status: Full code   Subjective: Patient still having pain with restless legs.  NG tube in place and is uncomfortable, but he is tolerating this  Objective: Vital signs were reviewed and unremarkable. Vitals:   08/22/21 0350 08/22/21 0754  BP: 120/81 121/74  Pulse: 78 83  Resp: 18 19  Temp: 97.6 F (36.4 C) 98.5 F (36.9 C)  SpO2: 94% 92%    Intake/Output Summary (Last 24 hours) at 08/22/2021 1517 Last data filed at 08/22/2021 0500 Gross per 24 hour  Intake 1562.9 ml  Output 900 ml  Net 662.9 ml    [employer]  08/20/21 0500 08/21/21 0500 08/22/21 0426  Weight: 94.1 kg 94.6 kg 93.7 kg   Body mass index is 31.41 kg/m.  Exam:  General: Alert and oriented x3, no acute  distress HEENT: Normocephalic and atraumatic, mucous membranes are moist Cardiovascular: Irregular rhythm, rate controlled Respiratory: Decreased breath sounds throughout Abdomen: Soft, nontender, nondistended, positive bowel sounds Musculoskeletal: No clubbing or cyanosis or edema Skin: No skin breaks, tears or lesions Psychiatry: Appropriate, no evidence of psychoses Neurology: No focal deficits  Data Reviewed: My review of labs, imaging, notes and other tests shows no new significant findings.   Disposition:  Status is: Inpatient  Remains inpatient appropriate because: Still requiring supplemental oxygen.  Needs inpatient rehab   Family Communication: Daughter at the bedside DVT Prophylaxis: SCDs Start: 08/07/21 0018 apixaban (ELIQUIS) tablet 5 mg    Author: Annita Brod ,MD 08/22/2021 3:17 PM  To reach On-call, see care teams to locate the attending and reach out via [email or web address]. Between 7PM-7AM, please contact night-coverage If you still have difficulty reaching the attending provider, please page the [facility] (Director on Call) for [facility] on amion for assistance.

## 2021-08-22 NOTE — Assessment & Plan Note (Addendum)
Sodium on 12/24 at 147.  Improved and since normalized with IV fluids.

## 2021-08-22 NOTE — Progress Notes (Signed)
Pt has NG tube placed and RT explained about the leak. Pt comfortable at this time on 3L [place].

## 2021-08-23 ENCOUNTER — Inpatient Hospital Stay (HOSPITAL_COMMUNITY): Payer: 59

## 2021-08-23 DIAGNOSIS — G2581 Restless legs syndrome: Secondary | ICD-10-CM | POA: Diagnosis not present

## 2021-08-23 DIAGNOSIS — E870 Hyperosmolality and hypernatremia: Secondary | ICD-10-CM | POA: Diagnosis not present

## 2021-08-23 DIAGNOSIS — R6521 Severe sepsis with septic shock: Secondary | ICD-10-CM | POA: Diagnosis not present

## 2021-08-23 DIAGNOSIS — J1008 Influenza due to other identified influenza virus with other specified pneumonia: Secondary | ICD-10-CM | POA: Diagnosis not present

## 2021-08-23 DIAGNOSIS — Z20822 Contact with and (suspected) exposure to covid-19: Secondary | ICD-10-CM | POA: Diagnosis not present

## 2021-08-23 DIAGNOSIS — Z23 Encounter for immunization: Secondary | ICD-10-CM | POA: Diagnosis not present

## 2021-08-23 DIAGNOSIS — A419 Sepsis, unspecified organism: Secondary | ICD-10-CM | POA: Diagnosis not present

## 2021-08-23 DIAGNOSIS — E669 Obesity, unspecified: Secondary | ICD-10-CM | POA: Diagnosis not present

## 2021-08-23 DIAGNOSIS — G4733 Obstructive sleep apnea (adult) (pediatric): Secondary | ICD-10-CM | POA: Diagnosis not present

## 2021-08-23 DIAGNOSIS — J159 Unspecified bacterial pneumonia: Secondary | ICD-10-CM | POA: Diagnosis not present

## 2021-08-23 DIAGNOSIS — J9601 Acute respiratory failure with hypoxia: Secondary | ICD-10-CM | POA: Diagnosis not present

## 2021-08-23 DIAGNOSIS — I482 Chronic atrial fibrillation, unspecified: Secondary | ICD-10-CM | POA: Diagnosis not present

## 2021-08-23 DIAGNOSIS — F05 Delirium due to known physiological condition: Secondary | ICD-10-CM | POA: Clinically undetermined

## 2021-08-23 DIAGNOSIS — I5033 Acute on chronic diastolic (congestive) heart failure: Secondary | ICD-10-CM | POA: Diagnosis not present

## 2021-08-23 DIAGNOSIS — I5032 Chronic diastolic (congestive) heart failure: Secondary | ICD-10-CM | POA: Diagnosis not present

## 2021-08-23 DIAGNOSIS — E119 Type 2 diabetes mellitus without complications: Secondary | ICD-10-CM | POA: Diagnosis not present

## 2021-08-23 DIAGNOSIS — R1310 Dysphagia, unspecified: Secondary | ICD-10-CM | POA: Diagnosis not present

## 2021-08-23 LAB — BASIC METABOLIC PANEL
Anion gap: 10 (ref 5–15)
BUN: 23 mg/dL (ref 8–23)
CO2: 25 mmol/L (ref 22–32)
Calcium: 7.6 mg/dL — ABNORMAL LOW (ref 8.9–10.3)
Chloride: 107 mmol/L (ref 98–111)
Creatinine, Ser: 0.69 mg/dL (ref 0.61–1.24)
GFR, Estimated: >60 mL/min (ref >60)
Glucose, Bld: 133 mg/dL — ABNORMAL HIGH (ref 70–99)
Potassium: 3.7 mmol/L (ref 3.5–5.1)
Sodium: 142 mmol/L (ref 135–145)

## 2021-08-23 LAB — GLUCOSE, CAPILLARY
Glucose-Capillary: 145 mg/dL — ABNORMAL HIGH (ref 70–99)
Glucose-Capillary: 136 mg/dL — ABNORMAL HIGH (ref 70–99)
Glucose-Capillary: 164 mg/dL — ABNORMAL HIGH (ref 70–99)
Glucose-Capillary: 169 mg/dL — ABNORMAL HIGH (ref 70–99)
Glucose-Capillary: 171 mg/dL — ABNORMAL HIGH (ref 70–99)
Glucose-Capillary: 173 mg/dL — ABNORMAL HIGH (ref 70–99)
Glucose-Capillary: 171 mg/dL — ABNORMAL HIGH (ref 70–99)
Glucose-Capillary: 110 mg/dL — ABNORMAL HIGH (ref 70–99)
Glucose-Capillary: 153 mg/dL — ABNORMAL HIGH (ref 70–99)

## 2021-08-23 LAB — CBC
HCT: 36 % — ABNORMAL LOW (ref 39.0–52.0)
Hemoglobin: 11.6 g/dL — ABNORMAL LOW (ref 13.0–17.0)
MCH: 27.8 pg (ref 26.0–34.0)
MCHC: 32.2 g/dL (ref 30.0–36.0)
MCV: 86.1 fL (ref 80.0–100.0)
Platelets: 143 10*3/uL — ABNORMAL LOW (ref 150–400)
RBC: 4.18 MIL/uL — ABNORMAL LOW (ref 4.22–5.81)
RDW: 14.1 % (ref 11.5–15.5)
WBC: 10.4 10*3/uL (ref 4.0–10.5)
nRBC: 0 % (ref 0.0–0.2)

## 2021-08-23 LAB — BRAIN NATRIURETIC PEPTIDE: B Natriuretic Peptide: 82.2 pg/mL (ref 0.0–100.0)

## 2021-08-23 LAB — MAGNESIUM: Magnesium: 1.8 mg/dL (ref 1.7–2.4)

## 2021-08-23 MED ORDER — PHENOL 1.4 % MT LIQD
1.0000 | OROMUCOSAL | Status: DC | PRN
  Administered 2021-08-23 (×2): 1 via OROMUCOSAL
  Filled 2021-08-23: qty 177

## 2021-08-23 MED ORDER — LOPERAMIDE HCL 2 MG PO CAPS
4.0000 mg | ORAL_CAPSULE | Freq: Once | ORAL | Status: AC
  Administered 2021-08-23: 01:00:00 4 mg via ORAL
  Filled 2021-08-23: qty 2

## 2021-08-23 MED ORDER — POTASSIUM CHLORIDE CRYS ER 20 MEQ PO TBCR
40.0000 meq | EXTENDED_RELEASE_TABLET | Freq: Once | ORAL | Status: AC
  Administered 2021-08-23: 01:00:00 40 meq via ORAL
  Filled 2021-08-23 (×2): qty 2

## 2021-08-23 MED ORDER — FOOD THICKENER (SIMPLYTHICK HONEY): 1.0000 | ORAL | Status: DC | PRN

## 2021-08-23 MED ORDER — FUROSEMIDE 10 MG/ML IJ SOLN
40.0000 mg | Freq: Once | INTRAMUSCULAR | Status: AC
Start: 2021-08-23 — End: 2021-08-23
  Administered 2021-08-23: 01:00:00 40 mg via INTRAVENOUS
  Filled 2021-08-23: qty 4

## 2021-08-23 MED ORDER — EZETIMIBE 10 MG PO TABS
10.0000 mg | ORAL_TABLET | Freq: Every day | ORAL | Status: DC
  Administered 2021-08-23 – 2021-08-25 (×3): 10 mg via ORAL
  Filled 2021-08-23 (×3): qty 1

## 2021-08-23 NOTE — Progress Notes (Signed)
Speech Language Pathology Treatment: Dysphagia  Patient Details Name: RAZI HICKLE MRN: [id number] DOB: 03/30/1956 Today's Date: 08/23/2021 Time: 6606-3016 SLP Time Calculation (min) (ACUTE ONLY): 15 min  Assessment / Plan / Recommendation Clinical Impression  Pt up in chiar, daughter and pt report significant improvement in vocal quality over the weekend. Pt has been drinking some thin liquids due to no access to thickener. SLP supplied some thickener and ordered a bag for pt from pharmacy, also requested restock from [employer]. Pt has also requested NG tube out. Trials of thin and honey thick liquids still result in delayed coughing. MBS warranted tomorrow after NG tube out to reassess swallowing and determine readiness for potential upgraded diet.   HPI HPI: 65 y/o male presented to ED 12/9 with flu/fever/dyspnea; desaturated 12/12 requiring bipap then intubation; extubated 12/22.  PMHx of active smoker, anxiety, CAD, permanent afib,  COPD, DMII, HTN, HHLD, OSA. Nov 2019 EGD with dilation of low grade Schatzi's ring.      SLP Plan  MBS      Recommendations for follow up therapy are one component of a multi-disciplinary discharge planning process, led by the attending physician.  Recommendations may be updated based on patient status, additional functional criteria and insurance authorization.    Recommendations  Diet recommendations: Honey-thick liquid Liquids provided via: Teaspoon                Follow Up Recommendations: Acute inpatient rehab (3hours/day) Plan: MBS           Sterling Ucci, Katherene Ponto  08/23/2021, 3:08 PM

## 2021-08-23 NOTE — Progress Notes (Signed)
[facility] Progress Note  Patient: Shaun [name]    YPP:[id number]  DOA: 08/06/2021    Date of Service: the patient was seen and examined on 08/23/2021  Brief hospital course: 65 year old smoker with history of anxiety, CHF, obesity, diabetes mellitus, obstructive sleep apnea and hypertension and diagnosis of flu 1 week prior admitted for worsening shortness of breath.  Initially admitted to hospitalist service, but within 24 hours, patient started to decompensate was intubated and placed on ventilator by 12/12.  Also required pressors for septic shock.  At that time also went into rapid atrial fibrillation.  Placed on antibiotics for concern for post fluid bacterial pneumonia.  Patient able to be weaned off of pressors.  [facility] on 12[facility]17 from [facility].  Extubated on 12/21 and down to 3.5 L nasal cannula at 98%.  Seen by cardiology and had been on IV amiodarone, changed to p.o. metoprolol following extubation.  Apixaban started.  Transferred to hospitalist service starting 12/24.  Physical therapy recommending inpatient rehab.  Approval pending.  In the interim, found to have some dysphagia, secondary to deconditioning.  Has had NG tube placed and Osmolite initiated on 12/25.  Assessment and Plan: Cardiovascular and Mediastinum Essential hypertension Assessment & Plan Blood pressure stable.  Chronic diastolic CHF (congestive heart failure) (HCC) Assessment & Plan BNP on admission okay.  Episodes of the night of 12/25 of shortness of breath and cough and with Lasix, significantly improved  Chronic atrial fibrillation (HCC) Assessment & Plan Rate controlled.  Now metoprolol.  Lovenox changed to apixaban  Respiratory Obstructive sleep apnea Assessment & Plan Continue nightly CPAP  COPD (chronic obstructive pulmonary disease) ([place]) Assessment & Plan Stable at this time.  Continue nebulizers, oxygen and steroids  Influenza Assessment & Plan Improving.  On  Tamiflu.  * Acute respiratory failure with hypoxemia [facility]) Assessment & Plan Improving.  Extubated, continues to improve, currently down to 2 L nasal cannula.  Some of this has been in response to diuresing  Digestive Dysphagia Assessment & Plan Due to overall deconditioning, time on ventilator.  Seen by speech therapy who expects over time, patient should improve.  In the meantime, appreciate nutrition consult.  NG tube placed on 12/24 started Osmolite.  However, he has not been able to tolerate tube and requesting it be discontinued.  We will do so and have speech therapy reevaluate.  He appears to be more alert and stronger  Endocrine Non-insulin treated type 2 diabetes mellitus ([place]) Assessment & Plan CBG stable, under 200  Other Tobacco abuse Assessment & Plan Counseled.  Nicotine patch.  RLS (restless legs syndrome) Assessment & Plan Have restarted Requip and trazodone at night.  Patient having improvement  Obesity (BMI 30-39.9) Assessment & Plan Meets criteria for BMI greater than 30  Sundowning Assessment & Plan Wife noted patient gets confused sometimes at night saying bizarre things.  Encourage that he be kept from napping during the day and that blinds be lifted to show sunlight.  Patient alert and oriented x3 during daytime.  Monitor closely.  Patient amenable to this plan.  Hypernatremia Assessment & Plan Sodium on 12/24 at 147.  Improved and down to 142.  Endotracheally intubated Assessment & Plan Off of ventilator.    Body mass index is 31.41 kg/m.  Nutrition Problem: Inadequate oral intake Etiology: inability to eat     Consultants: Critical care Cardiology  Procedures: On ventilator 12/12-12/21 Echocardiogram: Preserved ejection fraction, indeterminate diastolic function  Antimicrobials: Completed course of Tamiflu  Code Status: Full code   Subjective: Restless legs much improved.  Patient would like NG tube removed  Objective: Vital  signs were reviewed and unremarkable. Vitals:   08/23/21 0750 08/23/21 0820  BP: 102/70   Pulse: 77 76  Resp: 16 16  Temp: 97.8 F (36.6 C)   SpO2: 95% 94%    Intake/Output Summary (Last 24 hours) at 08/23/2021 1455 Last data filed at 08/23/2021 0401 Gross per 24 hour  Intake 712.54 ml  Output 1450 ml  Net -737.46 ml    Filed Weights   08/20/21 0500 08/21/21 0500 08/22/21 0426  Weight: 94.1 kg 94.6 kg 93.7 kg   Body mass index is 31.41 kg/m.  Exam:  General: Alert and oriented x3, no acute distress HEENT: Normocephalic and atraumatic, mucous membranes are moist Cardiovascular: Irregular rhythm, rate controlled Respiratory: Decreased breath sounds throughout Abdomen: Soft, nontender, nondistended, positive bowel sounds Musculoskeletal: No clubbing or cyanosis or edema Skin: No skin breaks, tears or lesions Psychiatry: Appropriate, no evidence of psychoses Neurology: No focal deficits  Data Reviewed: My review of labs, imaging, notes and other tests shows no new significant findings.   Disposition:  Status is: Inpatient  Remains inpatient appropriate because: Still requiring supplemental oxygen.  Needs inpatient rehab   Family Communication: Daughter and wife at the bedside DVT Prophylaxis: SCDs Start: 08/07/21 0018 apixaban (ELIQUIS) tablet 5 mg    Author: Annita Brod ,MD 08/23/2021 2:55 PM  To reach On-call, see care teams to locate the attending and reach out via [email or web address]. Between 7PM-7AM, please contact night-coverage If you still have difficulty reaching the attending provider, please page the [facility] (Director on Call) for [facility] on amion for assistance.

## 2021-08-23 NOTE — Progress Notes (Signed)
Inpatient Rehab Admissions Coordinator:   I do not have insurance auth or a bed for this pt. Today. I will open a case with his insurance once therapy notes are in today.  Clemens Catholic, [place], [place] Admissions Coordinator  [phone] ([place]) [phone] (office)

## 2021-08-23 NOTE — Progress Notes (Signed)
Physical Therapy Treatment Patient Details Name: Shaun [name] MRN: [id number] DOB: [birth year] Today's Date: 08/23/2021   History of Present Illness 65 y/o male presented to ED 12/9 with flu/fever/dyspnea; desaturated 12/12 requiring bipap then intubation; extubated 12/22. Rash on body; NG 12/24. PMHx of active smoker, anxiety, CAD, permanent afib,  COPD, DMII, HTN, HHLD, OSA. Nov 2019 EGD with dilation of low grade Schatzi's ring.    PT Comments    Pt seen for mobility progression. He remains very motivated and eager to make progress with therapy. He tolerated several sit<>stands from the recliner chair with use of the STEDY. His right hip was causing him pain in standing and limited his standing endurance today. Pt would continue to benefit from skilled physical therapy services at this time while admitted and after d/c to address the below listed limitations in order to improve overall safety and independence with functional mobility.    Recommendations for follow up therapy are one component of a multi-disciplinary discharge planning process, led by the attending physician.  Recommendations may be updated based on patient status, additional functional criteria and insurance authorization.  Follow Up Recommendations  Acute inpatient rehab (3hours/day)     Assistance Recommended at Discharge Frequent or constant Supervision/Assistance  Equipment Recommendations  Other (comment) (defer to next venue of care)    Recommendations for Other Services       Precautions / Restrictions Precautions Precautions: Fall Precaution Comments: NG; L shoulder weakness     Mobility  Bed Mobility Overal bed mobility: Needs Assistance Bed Mobility: Supine to Sit     Supine to sit: Min assist;HOB elevated     General bed mobility comments: pt OOB sitting in recliner chair upon PT arrival    Transfers Overall transfer level: Needs assistance Equipment used:  (STEDY) Transfers: Sit  to/from Stand Sit to Stand: Min assist;+2 physical assistance;+2 safety/equipment           General transfer comment: pt performing sit-to-stand x1 from recliner chair using the STEDY and min A x2 to power up into a full standing position with verbal cueing to correct posture. He then performed sit<>stand x4 in the STEDY from the pads of the equipment with min guard. He was limited secondary to R hip pain with standing endurance and amount of times performing the transfer Transfer via Lift Equipment: Stedy  Ambulation/Gait               General Gait Details: unable   Chief Strategy Officer    Modified Rankin (Stroke Patients Only)       Balance Overall balance assessment: Needs assistance Sitting-balance support: Feet supported Sitting balance-Leahy Scale: Poor     Standing balance support: Bilateral upper extremity supported Standing balance-Leahy Scale: Poor                              Cognition Arousal/Alertness: Awake/alert Behavior During Therapy: WFL for tasks assessed/performed Overall Cognitive Status: Impaired/Different from baseline Area of Impairment: Safety/judgement;Problem solving                 Orientation Level: Disoriented to;Time Current Attention Level: Selective Memory: Decreased short-term memory   Safety/Judgement: Decreased awareness of safety;Decreased awareness of deficits Awareness: Emergent Problem Solving: Slow processing;Difficulty sequencing;Requires verbal cues          Exercises General Exercises - Upper Extremity Shoulder Flexion: AAROM;Both;15 reps;Seated Elbow Flexion:  AROM;AAROM;Both;15 reps;Seated Elbow Extension: AAROM;Both;15 reps;Seated Digit Composite Flexion: Both;AROM;10 reps Composite Extension: AROM;Both;10 reps General Exercises - Lower Extremity Heel Slides: Both;10 reps;Supine Hip Flexion/Marching: Both;10 reps;Seated    General Comments General comments  (skin integrity, edema, etc.): VSS on 2      Pertinent Vitals/Pain Pain Assessment: Faces Faces Pain Scale: Hurts little more Pain Location: R hip Pain Descriptors / Indicators: Aching;Discomfort Pain Intervention(s): Monitored during session;Repositioned    Home Living                          Prior Function            PT Goals (current goals can now be found in the care plan section) Acute Rehab PT Goals PT Goal Formulation: With patient Time For Goal Achievement: 09/03/21 Potential to Achieve Goals: Good Progress towards PT goals: Progressing toward goals    Frequency    Min 3X/week      PT Plan Current plan remains appropriate    Co-evaluation              AM-PAC PT "6 Clicks" Mobility   Outcome Measure  Help needed turning from your back to your side while in a flat bed without using bedrails?: A Lot Help needed moving from lying on your back to sitting on the side of a flat bed without using bedrails?: A Lot Help needed moving to and from a bed to a chair (including a wheelchair)?: Total Help needed standing up from a chair using your arms (e.g., wheelchair or bedside chair)?: A Lot Help needed to walk in hospital room?: Total Help needed climbing 3-5 steps with a railing? : Total 6 Click Score: 9    End of Session Equipment Utilized During Treatment: Gait belt;Oxygen Activity Tolerance: Patient limited by fatigue;Patient limited by pain Patient left: in chair;with call bell/phone within reach;with chair alarm set;with family/visitor present Nurse Communication: Mobility status PT Visit Diagnosis: Other abnormalities of gait and mobility (R26.89);Muscle weakness (generalized) (M62.81)     Time: 6045-4098 PT Time Calculation (min) (ACUTE ONLY): 16 min  Charges:  $Therapeutic Activity: 8-22 mins                     Anastasio Champion, DPT  Acute Rehabilitation Services Office [place] 08/23/2021, 12:03 PM

## 2021-08-23 NOTE — Progress Notes (Signed)
Occupational Therapy Treatment Patient Details Name: Shaun [name] MRN: [id number] DOB: Jan 29, 1956 Today's Date: 08/23/2021   History of present illness 65 y/o male presented to ED 12/9 with flu/fever/dyspnea; desaturated 12/12 requiring bipap then intubation; extubated 12/22. Rash on body; NG 12/24. PMHx of active smoker, anxiety, CAD, permanent afib,  COPD, DMII, HTN, HHLD, OSA. Nov 2019 EGD with dilation of low grade Schatzi's ring.   OT comments  Pt making excellent progress toward goals as noted below. Increased activity tolerance and ability to sit EOB with S. Stedy used with mod A to mobilize OOB to chair. Able to stand from paddles with min A. VSS on 2L throughout session. Family encouraged to complete BU/LE with pt. Pt very motivated to improve and supportive family available to help after DC. Continue to recommend AIR for rehab.    Recommendations for follow up therapy are one component of a multi-disciplinary discharge planning process, led by the attending physician.  Recommendations may be updated based on patient status, additional functional criteria and insurance authorization.    Follow Up Recommendations  Acute inpatient rehab (3hours/day)    Assistance Recommended at Discharge Frequent or constant Supervision/Assistance  Equipment Recommendations  Other (comment);BSC/3in1    Recommendations for Other Services      Precautions / Restrictions Precautions Precautions: Fall Precaution Comments: NG; L shoulder weakness       Mobility Bed Mobility Overal bed mobility: Needs Assistance Bed Mobility: Supine to Sit     Supine to sit: Min assist;HOB elevated          Transfers Overall transfer level: Needs assistance                 General transfer comment: Mod A +2 form bed; min A +1 from paddles Transfer via Lift Equipment: Stedy   Balance     Sitting balance-Leahy Scale: Poor                                     ADL either  performed or assessed with clinical judgement   ADL Overall ADL's : Needs assistance/impaired         Upper Body Bathing: Moderate assistance   Lower Body Bathing: Maximal assistance       Lower Body Dressing: Total assistance       Toileting- Clothing Manipulation and Hygiene: Total assistance       Functional mobility during ADLs: Total assistance (use of Stedy; pt alb eto stand form paddles with min A)      Extremity/Trunk Assessment Upper Extremity Assessment RUE Coordination: decreased gross motor (genralized weakness) LUE Deficits / Details: prior RTC issues, however pt states he was able to use his arm " normally; greater weakenss proximaly; elbow/wrist/hand within gross limits with decreased in-hand manipulation skills; FF - pt unable to lift arm off bed   Lower Extremity Assessment Lower Extremity Assessment: Defer to PT evaluation        Vision       Perception     Praxis      Cognition Arousal/Alertness: Awake/alert Behavior During Therapy: WFL for tasks assessed/performed Overall Cognitive Status: Impaired/Different from baseline Area of Impairment: Orientation;Attention;Memory;Safety/judgement;Awareness;Problem solving                 Orientation Level: Disoriented to;Time Current Attention Level: Selective Memory: Decreased short-term memory   Safety/Judgement: Decreased awareness of safety;Decreased awareness of deficits Awareness: Emergent Problem Solving: Slow processing  Exercises Exercises: General Upper Extremity;General Lower Extremity General Exercises - Upper Extremity Shoulder Flexion: AAROM;Both;15 reps;Seated Elbow Flexion: AROM;AAROM;Both;15 reps;Seated Elbow Extension: AAROM;Both;15 reps;Seated Digit Composite Flexion: Both;AROM;10 reps Composite Extension: AROM;Both;10 reps General Exercises - Lower Extremity Heel Slides: Both;10 reps;Supine Hip Flexion/Marching: Both;10 reps;Seated   Shoulder  Instructions       General Comments VSS on 2; BP 90/73 (initially to chair); 93/68 ( after 2 min in chair)    Pertinent Vitals/ Pain       Pain Assessment: Faces Faces Pain Scale: Hurts little more Pain Location: hip Pain Descriptors / Indicators: Aching;Discomfort Pain Intervention(s): Limited activity within patient's tolerance  Home Living                                          Prior Functioning/Environment              Frequency  Min 2X/week        Progress Toward Goals  OT Goals(current goals can now be found in the care plan section)  Progress towards OT goals: Progressing toward goals  Acute Rehab OT Goals Patient Stated Goal: to go to rehab OT Goal Formulation: With patient/family Time For Goal Achievement: 09/03/21 Potential to Achieve Goals: Good ADL Goals Pt Will Perform Grooming: with min assist Pt Will Transfer to Toilet: with min assist;stand pivot transfer;squat pivot transfer;bedside commode Pt/caregiver will Perform Home Exercise Program: Increased ROM;Increased strength;Both right and left upper extremity;With written HEP provided Additional ADL Goal #1: Pt will be able to roll left and right with min A to A with basic ADLs and in prep to get up to EOB Additional ADL Goal #2: Pt will be able to come up to sit with min A from sidelying position Additional ADL Goal #3: Pt will be able to maintain sitting balance at EOB with min A for >5 minutes  Plan Discharge plan remains appropriate    Co-evaluation                 AM-PAC OT "6 Clicks" Daily Activity     Outcome Measure   Help from another person eating meals?: A Little Help from another person taking care of personal grooming?: A Little Help from another person toileting, which includes using toliet, bedpan, or urinal?: Total Help from another person bathing (including washing, rinsing, drying)?: A Lot Help from another person to put on and taking off regular  upper body clothing?: A Lot Help from another person to put on and taking off regular lower body clothing?: Total 6 Click Score: 12    End of Session Equipment Utilized During Treatment: Gait belt;Oxygen (2L)  OT Visit Diagnosis: Unsteadiness on feet (R26.81);Other abnormalities of gait and mobility (R26.89);Muscle weakness (generalized) (M62.81);Other symptoms and signs involving cognitive function;Pain   Activity Tolerance Patient tolerated treatment well   Patient Left in chair;with call bell/phone within reach;with chair alarm set;with family/visitor present   Nurse Communication Mobility status;Need for lift equipment        Time: [phone] OT Time Calculation (min): 38 min  Charges: OT General Charges $OT Visit: 1 Visit OT Treatments $Self Care/Home Management : 8-22 mins $Therapeutic Activity: 8-22 mins $Neuromuscular Re-education: 8-22 mins  Maurie Boettcher, OT/L   Acute OT Clinical Specialist Acute Rehabilitation Services Pager [phone] Office [phone]   [facility] 08/23/2021, 10:48 AM

## 2021-08-23 NOTE — Progress Notes (Signed)
Progress Note  Patient Name: Shaun [name] Date of Encounter: 08/23/2021  [facility] Cardiologist: Kirk Ruths, MD   Subjective   Received IV lasix this AM. Reports that he takes lasix at home, holds on to fluid mostly in his abdomen. Worked with PT this AM, hopeful to have NG tube removed. No chest pain, breathing is stable.  Inpatient Medications    Scheduled Meds:  apixaban  5 mg Oral BID   ARIPiprazole  2 mg Oral Daily   atorvastatin  80 mg Oral Daily   Chlorhexidine Gluconate Cloth  6 each Topical Daily   docusate sodium  100 mg Oral Daily   escitalopram  20 mg Oral Daily   ezetimibe  10 mg Oral Daily   insulin aspart  0-20 Units Subcutaneous TID WC   levalbuterol  1.25 mg Nebulization BID   metoprolol tartrate  100 mg Oral BID   mupirocin ointment   Nasal BID   pantoprazole  40 mg Oral Daily   rOPINIRole  1 mg Oral QHS   Continuous Infusions:  sodium chloride Stopped (08/23/21 0102)   feeding supplement (OSMOLITE 1.5 CAL) Stopped (08/22/21 2240)   PRN Meds: sodium chloride, acetaminophen **OR** acetaminophen (TYLENOL) oral liquid 160 mg/5 mL **OR** acetaminophen, bisacodyl, levalbuterol, morphine injection, ondansetron **OR** ondansetron (ZOFRAN) IV, phenol, traZODone   Vital Signs    Vitals:   08/22/21 2038 08/23/21 0400 08/23/21 0750 08/23/21 0820  BP: 123/73 101/66 102/70   Pulse: 84 77 77 76  Resp: 19  16 16   Temp: 97.9 F (36.6 C) 98.6 F (37 C) 97.8 F (36.6 C)   TempSrc: Oral Oral Oral   SpO2: 97% 94% 95% 94%  Weight:      Height:        Intake/Output Summary (Last 24 hours) at 08/23/2021 1037 Last data filed at 08/23/2021 0401 Gross per 24 hour  Intake 712.54 ml  Output 1450 ml  Net -737.46 ml   Last 3 Weights 08/22/2021 08/21/2021 08/20/2021  Weight (lbs) 206 lb 9.1 oz 208 lb 8.9 oz 207 lb 7.3 oz  Weight (kg) 93.7 kg 94.6 kg 94.1 kg      Telemetry    Not currently on telemetry - Personally Reviewed  ECG    No new  since 12/13 - Personally Reviewed  Physical Exam   GEN: Well nourished, well developed in no acute distress HEENT: Normal, moist mucous membranes. NG tube in place NECK: No JVD CARDIAC: irregularly irregular rhythm, normal S1 and S2, no rubs or gallops. No murmur. VASCULAR: Radial and DP pulses 2+ bilaterally.  RESPIRATORY:  Coarse throughout ABDOMEN: Soft, non-tender, non-distended MUSCULOSKELETAL:  Ambulates independently SKIN: Warm and dry, no edema NEUROLOGIC:  Alert and oriented x 3. No focal neuro deficits noted. PSYCHIATRIC:  Normal affect    Labs    High Sensitivity Troponin:   Recent Labs  Lab 08/06/21 2140 08/06/21 2348 08/10/21 0350  TROPONINIHS 4 4 4      Chemistry Recent Labs  Lab 08/17/21 0432 08/18/21 0336 08/21/21 0053 08/22/21 1049 08/23/21 0031  NA 143   < > 147* 141 142  K 4.4   < > 4.1 3.2* 3.7  CL 104   < > 111 108 107  CO2 32   < > 28 27 25   GLUCOSE 168*   < > 172* 159* 133*  BUN 66*   < > 41* 27* 23  CREATININE 1.06   < > 0.85 0.76 0.69  CALCIUM 8.1*   < >  8.2* 7.8* 7.6*  MG 2.5*  --   --   --  1.8  GFRNONAA >60   < > >60 >60 >60  ANIONGAP 7   < > 8 6 10    < > = values in this interval not displayed.    Lipids  Recent Labs  Lab 08/18/21 0336  TRIG 153*    Hematology Recent Labs  Lab 08/19/21 0339 08/20/21 0338 08/23/21 0031  WBC 10.7* 12.2*   12.4* 10.4  RBC 4.15* 4.35   4.38 4.18*  HGB 11.7* 12.4*   12.4* 11.6*  HCT 36.4* 39.0   39.0 36.0*  MCV 87.7 89.7   89.0 86.1  MCH 28.2 28.5   28.3 27.8  MCHC 32.1 31.8   31.8 32.2  RDW 14.3 14.3   14.2 14.1  PLT 110* 120*   114* 143*   Thyroid No results for input(s): TSH, FREET4 in the last 168 hours.  BNP Recent Labs  Lab 08/17/21 0432  BNP 26.2    DDimer No results for input(s): DDIMER in the last 168 hours.   Radiology    DG CHEST PORT 1 VIEW  Result Date: 08/23/2021 CLINICAL DATA:  Shortness of breath. EXAM: PORTABLE CHEST 1 VIEW COMPARISON:  Chest radiograph dated  08/17/2021. FINDINGS: Enteric tube extends below the diaphragm with tip beyond the inferior margin of the image. Interval removal of the endotracheal tube and left IJ central venous line. Cardiomegaly with vascular congestion. Bibasilar atelectasis/scarring. No focal consolidation, pleural effusion, pneumothorax. No acute osseous pathology. IMPRESSION: Cardiomegaly with vascular congestion. No focal consolidation. Electronically Signed   By: Anner Crete M.D.   On: 08/23/2021 00:04   DG Abd Portable 1V  Result Date: 08/23/2021 CLINICAL DATA:  Abdominal pain EXAM: PORTABLE ABDOMEN - 1 VIEW COMPARISON:  08/21/2021 FINDINGS: Enteric tube terminates in the distal gastric antrum/pyloric region. Nonobstructive bowel gas pattern. Degenerative changes of the right hip. IMPRESSION: Enteric tube terminates in the distal gastric antrum/pyloric region. Electronically Signed   By: Julian Hy M.D.   On: 08/23/2021 05:36   DG Abd Portable 1V  Result Date: 08/21/2021 CLINICAL DATA:  Nasogastric placement EXAM: PORTABLE ABDOMEN - 1 VIEW COMPARISON:  08/19/2021 FINDINGS: Nasogastric tube enters the stomach with its tip in the fundus. Gas pattern unremarkable. IMPRESSION: Nasogastric tube in the fundus of the stomach. Electronically Signed   By: Nelson Chimes M.D.   On: 08/21/2021 16:11    Cardiac Studies   Echo 08/10/21  1. Left ventricular ejection fraction, by estimation, is 60 to 65%. The  left ventricle has normal function. The left ventricle has no regional  wall motion abnormalities. There is moderate concentric left ventricular  hypertrophy. Left ventricular  diastolic parameters are indeterminate.   2. Right ventricular systolic function is normal. The right ventricular  size is normal. Tricuspid regurgitation signal is inadequate for assessing  PA pressure.   3. Left atrial size was mildly dilated.   4. The mitral valve is grossly normal. Trivial mitral valve  regurgitation.   5. The  aortic valve is tricuspid. Aortic valve regurgitation is not  visualized.   6. Unable to estimate CVP.   Comparison(s): Prior images reviewed side by side. LVEF stable at 60-65%.   Patient Profile     65 y.o. male with PMH CAD s/p DES to Lcx (2016), permanent Afib, COPD, DMII, HTN, HHLD, OSA who [facility] with dyspnea found to have Influenza A. Course complicated by acute hypoxic respiratory failure requiring intubation  and Afib with RVR for which Cardiology is consulted.  Echocardiogram this admission shows normal LV function, mild left atrial enlargement.  Assessment & Plan    Permanent atrial fibrillation Afib RVR with sepsis, now resolved -now rate controlled -CHA2DS2/VAS Stroke Risk Points= 5 -continue apixaban 5 mg BID -he is tolerating metoprolol tartrate 100 mg BID and is off amiodarone  Chronic diastolic heart failure -appears nearly euvolemic on exam, coarse breath sounds. Carries his fluid in his abdomen typically -consider additional dose of IV lasix this afternoon, suspect oral lasix can be restarted tomorrow  Hyeprcholesterolemia: -restart ezetimibe  Influenza A Pneumonia -per primary team  Dispo: awaiting inpatient rehab  [place] will sign off.   Medication Recommendations:  continue metoprolol, apixaban, ezetimibe. Restart oral lasix tomorrow Other recommendations (labs, testing, etc):  none Follow up as an outpatient:  We will arrange for outpatient follow up with Dr. Stanford Breed or a member of his team  For questions or updates, please contact [facility] Please consult [email or web address] for contact info under     Signed, Buford Dresser, MD  08/23/2021, 10:37 AM

## 2021-08-23 NOTE — Assessment & Plan Note (Signed)
Wife noted patient gets confused sometimes at night saying bizarre things.  Encourage that he be kept from napping during the day and that blinds be lifted to show sunlight.  Patient alert and oriented x3 during daytime.  Monitor closely.  Patient amenable to this plan.

## 2021-08-24 ENCOUNTER — Inpatient Hospital Stay (HOSPITAL_COMMUNITY): Payer: 59

## 2021-08-24 DIAGNOSIS — I5032 Chronic diastolic (congestive) heart failure: Secondary | ICD-10-CM | POA: Diagnosis not present

## 2021-08-24 DIAGNOSIS — J9601 Acute respiratory failure with hypoxia: Secondary | ICD-10-CM | POA: Diagnosis not present

## 2021-08-24 DIAGNOSIS — I482 Chronic atrial fibrillation, unspecified: Secondary | ICD-10-CM | POA: Diagnosis not present

## 2021-08-24 LAB — BASIC METABOLIC PANEL
Anion gap: 8 (ref 5–15)
BUN: 22 mg/dL (ref 8–23)
CO2: 24 mmol/L (ref 22–32)
Calcium: 7.4 mg/dL — ABNORMAL LOW (ref 8.9–10.3)
Chloride: 104 mmol/L (ref 98–111)
Creatinine, Ser: 0.73 mg/dL (ref 0.61–1.24)
GFR, Estimated: >60 mL/min (ref >60)
Glucose, Bld: 127 mg/dL — ABNORMAL HIGH (ref 70–99)
Potassium: 4 mmol/L (ref 3.5–5.1)
Sodium: 136 mmol/L (ref 135–145)

## 2021-08-24 LAB — GLUCOSE, CAPILLARY
Glucose-Capillary: 124 mg/dL — ABNORMAL HIGH (ref 70–99)
Glucose-Capillary: 130 mg/dL — ABNORMAL HIGH (ref 70–99)
Glucose-Capillary: 132 mg/dL — ABNORMAL HIGH (ref 70–99)
Glucose-Capillary: 136 mg/dL — ABNORMAL HIGH (ref 70–99)
Glucose-Capillary: 136 mg/dL — ABNORMAL HIGH (ref 70–99)
Glucose-Capillary: 147 mg/dL — ABNORMAL HIGH (ref 70–99)

## 2021-08-24 MED ORDER — FUROSEMIDE 10 MG/ML IJ SOLN
20.0000 mg | Freq: Two times a day (BID) | INTRAMUSCULAR | Status: DC
  Administered 2021-08-24 – 2021-08-25 (×2): 20 mg via INTRAVENOUS
  Filled 2021-08-24 (×2): qty 2

## 2021-08-24 NOTE — Progress Notes (Signed)
Inpatient Rehab Admissions Coordinator:   I opened a case for insurance auth for CIR this morning and assistant asked representative what happens if Pt.'s insurance changes after already admitted to CIR (Pt. To switch to [facility] Medicare 08/29/20) Current insurance states they will stop paying 12/31 and we will have to get authorization for his rehab from  the new payor. [employer] medicare (the new payor) is unlikely to approve rehab for Pt.'s diagnosis, so pt. Would be at risk of having to pay out of pocket for CIR days after 1//1/22. I explained this to Pt. And his wife and they want a day to think about their options (they are also considering SNF or HH). I will follow up with them tomorrow.   Clemens Catholic, [place], [place] Admissions Coordinator  [phone] ([place]) [phone] (office)

## 2021-08-24 NOTE — Progress Notes (Signed)
[facility] Progress Note  Patient: Shaun [name]    WNI:[id number]  DOA: 08/06/2021    Date of Service: the patient was seen and examined on 08/24/2021  Brief hospital course: 65 year old smoker with history of anxiety, CHF, obesity, diabetes mellitus, obstructive sleep apnea and hypertension and diagnosis of flu 1 week prior admitted for worsening shortness of breath.  Initially admitted to hospitalist service, but within 24 hours, patient started to decompensate was intubated and placed on ventilator by 12/12.  Also required pressors for septic shock.  At that time also went into rapid atrial fibrillation.  Placed on antibiotics for concern for post fluid bacterial pneumonia.  Patient able to be weaned off of pressors.  Transfer to [facility] ICU on 12/17 from [facility].  Extubated on 12/21 and down to 3.5 L nasal cannula at 98%.  Seen by cardiology and had been on IV amiodarone, changed to p.o. metoprolol following extubation.  Apixaban started.  Transferred to hospitalist service starting 12/24.  Physical therapy recommending inpatient rehab, although it looks like insurance will not cover this.  Possible skilled nursing versus home health.    In the interim, found to have some dysphagia, secondary to deconditioning.  Has had NG tube placed and Osmolite initiated on 12/25.  Not tolerating NG tube and it was removed.  Therapy did modified barium swallow and patient on 12/27 and swelling has since improved and restrictions discontinued.  Patient developed some shortness of breath on 12/25 and found to be mildly volume overloaded and responded to IV Lasix.  Assessment and Plan: Cardiovascular and Mediastinum Essential hypertension Assessment & Plan Blood pressure stable.  Chronic diastolic CHF (congestive heart failure) (HCC) Assessment & Plan BNP on admission normal, although with diastolic failure, not always elevated.  Episodes on the night of 12/25 of shortness of breath and cough  and with Lasix, significantly improved.  Continue few more doses of IV Lasix  Chronic atrial fibrillation (HCC) Assessment & Plan Rate controlled.  Now metoprolol.  Lovenox changed to apixaban  Respiratory Obstructive sleep apnea Assessment & Plan Continue nightly CPAP  COPD (chronic obstructive pulmonary disease) ([place]) Assessment & Plan Stable at this time.  Continue nebulizers, oxygen and steroids  Influenza Assessment & Plan Improving.  Finished course of Tamiflu.  * Acute respiratory failure with hypoxemia [facility]) Assessment & Plan Improving.  Extubated, continues to improve, currently down to 2 L nasal cannula.  Some of this has been in response to diuresing.  Try to wean off today.  Digestive Dysphagia Assessment & Plan Due to overall deconditioning, time on ventilator.  Seen by speech therapy and initially patient placed on restrictions.  NG tube placed on 12/24 started Osmolite.  However, patient could not tolerate and tube was discontinued on 12/26.  Speech therapy evaluated patient on 12/27 and swallowing much improved.  Dietary restrictions have been discontinued.  Endocrine Non-insulin treated type 2 diabetes mellitus ([place]) Assessment & Plan CBG stable, under 200  Other Tobacco abuse Assessment & Plan Counseled.  Nicotine patch.  RLS (restless legs syndrome) Assessment & Plan Have restarted Requip and trazodone at night.  Patient having improvement  Obesity (BMI 30-39.9) Assessment & Plan Meets criteria for BMI greater than 30  Sundowning Assessment & Plan Wife noted patient gets confused sometimes at night saying bizarre things.  Encourage that he be kept from napping during the day and that blinds be lifted to show sunlight.  Patient alert and oriented x3 during daytime.  Monitor closely.  Patient amenable  to this plan.  Hypernatremia Assessment & Plan Sodium on 12/24 at 147.  Improved and since normalized with IV fluids.  Endotracheally  intubated Assessment & Plan Off of ventilator.    Body mass index is 31.41 kg/m.  Nutrition Problem: Inadequate oral intake Etiology: inability to eat     Consultants: Critical care Cardiology  Procedures: On ventilator 12/12-12/21 Echocardiogram: Preserved ejection fraction, indeterminate diastolic function  Antimicrobials: Completed course of Tamiflu  Code Status: Full code   Subjective: Patient happy about NG tube being out and being able to eat solid food.  Objective: Vital signs were reviewed and unremarkable. Vitals:   08/24/21 0502 08/24/21 0824  BP: 106/73 118/88  Pulse: 73 91  Resp: 16 19  Temp: 98.2 F (36.8 C) (!) 97.5 F (36.4 C)  SpO2: 96% 95%    Intake/Output Summary (Last 24 hours) at 08/24/2021 1359 Last data filed at 08/24/2021 0300 Gross per 24 hour  Intake --  Output 1100 ml  Net -1100 ml    Filed Weights   08/20/21 0500 08/21/21 0500 08/22/21 0426  Weight: 94.1 kg 94.6 kg 93.7 kg   Body mass index is 31.41 kg/m.  Exam:  General: Alert and oriented x3, no acute distress HEENT: Normocephalic and atraumatic, mucous membranes are moist Cardiovascular: Irregular rhythm, rate controlled Respiratory: Decreased breath sounds throughout Abdomen: Soft, nontender, nondistended, positive bowel sounds Musculoskeletal: No clubbing or cyanosis or edema Skin: No skin breaks, tears or lesions Psychiatry: Appropriate, no evidence of psychoses Neurology: No focal deficits  Data Reviewed: My review of labs, imaging, notes and other tests shows no new significant findings.   Disposition:  Status is: Inpatient  Remains inpatient appropriate because: Determination for disposition, possibly skilled nursing   Family Communication: Wife at the bedside DVT Prophylaxis: SCDs Start: 08/07/21 0018 apixaban (ELIQUIS) tablet 5 mg    Author: Annita Brod ,MD 08/24/2021 1:59 PM  To reach On-call, see care teams to locate the attending and  reach out via [email or web address]. Between 7PM-7AM, please contact night-coverage If you still have difficulty reaching the attending provider, please page the [facility] (Director on Call) for [facility] on amion for assistance.

## 2021-08-24 NOTE — Progress Notes (Signed)
Modified Barium Swallow Progress Note  Patient Details  Name: Shaun [name] MRN: [id number] Date of Birth: 01/30/1956  Today's Date: 08/24/2021  Modified Barium Swallow completed.  Full report located under Chart Review in the Imaging Section.  Brief recommendations include the following:  Clinical Impression  Pt demonstrates significant improvement in swallowing. Only mild pharyngeal weakness resuling in trace high vestibular penetration and mild vallecular residue without pt awareness. He was primarily in a chin tuck position during test, but it did not appear particularly beneficial. Pt cued to take dry swallows and use a liquid wash, which was helpful. Pt recommended to consume a regular diet and thin liquids with basic precautions.   Swallow Evaluation Recommendations       SLP Diet Recommendations: Regular solids;Thin liquid   Liquid Administration via: Cup;Straw   Medication Administration: Whole meds with liquid   Supervision: Patient able to self feed   Compensations: Slow rate;Small sips/bites;Multiple dry swallows after each bite/sip;Hard cough after swallow       Oral Care Recommendations: Oral care BID        Laberta Wilbon, Katherene Ponto 08/24/2021,10:09 AM

## 2021-08-24 NOTE — Progress Notes (Signed)
Placed patient on CPAP for HS use. Auto settings, with 2L O2 bleed in.

## 2021-08-24 NOTE — Progress Notes (Signed)
Pt has NG tube placed and RT explained about the leak. Pt comfortable at this time on 3L [place]

## 2021-08-25 ENCOUNTER — Encounter (HOSPITAL_COMMUNITY): Payer: Self-pay | Admitting: Physical Medicine & Rehabilitation

## 2021-08-25 ENCOUNTER — Other Ambulatory Visit: Payer: Self-pay | Admitting: Registered Nurse

## 2021-08-25 ENCOUNTER — Inpatient Hospital Stay (HOSPITAL_COMMUNITY)
Admission: RE | Admit: 2021-08-25 | Discharge: 2021-08-30 | DRG: 945 | Disposition: A | Discharge status: Home Health | Payer: 59 | Source: Intra-hospital | Attending: Physical Medicine and Rehabilitation | Admitting: Physical Medicine and Rehabilitation

## 2021-08-25 ENCOUNTER — Other Ambulatory Visit: Payer: Self-pay | Admitting: Cardiology

## 2021-08-25 ENCOUNTER — Other Ambulatory Visit: Payer: Self-pay

## 2021-08-25 ENCOUNTER — Other Ambulatory Visit (HOSPITAL_BASED_OUTPATIENT_CLINIC_OR_DEPARTMENT_OTHER): Payer: Self-pay

## 2021-08-25 DIAGNOSIS — F32A Depression, unspecified: Secondary | ICD-10-CM | POA: Diagnosis present

## 2021-08-25 DIAGNOSIS — J449 Chronic obstructive pulmonary disease, unspecified: Secondary | ICD-10-CM | POA: Diagnosis present

## 2021-08-25 DIAGNOSIS — E119 Type 2 diabetes mellitus without complications: Secondary | ICD-10-CM | POA: Diagnosis not present

## 2021-08-25 DIAGNOSIS — I110 Hypertensive heart disease with heart failure: Secondary | ICD-10-CM | POA: Diagnosis not present

## 2021-08-25 DIAGNOSIS — Z8711 Personal history of peptic ulcer disease: Secondary | ICD-10-CM

## 2021-08-25 DIAGNOSIS — Z955 Presence of coronary angioplasty implant and graft: Secondary | ICD-10-CM

## 2021-08-25 DIAGNOSIS — G2581 Restless legs syndrome: Secondary | ICD-10-CM | POA: Diagnosis present

## 2021-08-25 DIAGNOSIS — G4700 Insomnia, unspecified: Secondary | ICD-10-CM | POA: Diagnosis present

## 2021-08-25 DIAGNOSIS — F419 Anxiety disorder, unspecified: Secondary | ICD-10-CM | POA: Diagnosis present

## 2021-08-25 DIAGNOSIS — I10 Essential (primary) hypertension: Secondary | ICD-10-CM

## 2021-08-25 DIAGNOSIS — E785 Hyperlipidemia, unspecified: Secondary | ICD-10-CM | POA: Diagnosis present

## 2021-08-25 DIAGNOSIS — Z833 Family history of diabetes mellitus: Secondary | ICD-10-CM | POA: Diagnosis not present

## 2021-08-25 DIAGNOSIS — I2510 Atherosclerotic heart disease of native coronary artery without angina pectoris: Secondary | ICD-10-CM | POA: Diagnosis present

## 2021-08-25 DIAGNOSIS — R269 Unspecified abnormalities of gait and mobility: Secondary | ICD-10-CM | POA: Diagnosis present

## 2021-08-25 DIAGNOSIS — J9600 Acute respiratory failure, unspecified whether with hypoxia or hypercapnia: Secondary | ICD-10-CM | POA: Diagnosis present

## 2021-08-25 DIAGNOSIS — E876 Hypokalemia: Secondary | ICD-10-CM | POA: Diagnosis not present

## 2021-08-25 DIAGNOSIS — R1310 Dysphagia, unspecified: Secondary | ICD-10-CM | POA: Diagnosis not present

## 2021-08-25 DIAGNOSIS — F5101 Primary insomnia: Secondary | ICD-10-CM

## 2021-08-25 DIAGNOSIS — J9601 Acute respiratory failure with hypoxia: Secondary | ICD-10-CM | POA: Diagnosis not present

## 2021-08-25 DIAGNOSIS — F339 Major depressive disorder, recurrent, unspecified: Secondary | ICD-10-CM

## 2021-08-25 DIAGNOSIS — Z716 Tobacco abuse counseling: Secondary | ICD-10-CM

## 2021-08-25 DIAGNOSIS — E669 Obesity, unspecified: Secondary | ICD-10-CM | POA: Diagnosis not present

## 2021-08-25 DIAGNOSIS — K219 Gastro-esophageal reflux disease without esophagitis: Secondary | ICD-10-CM | POA: Diagnosis present

## 2021-08-25 DIAGNOSIS — Z7901 Long term (current) use of anticoagulants: Secondary | ICD-10-CM | POA: Diagnosis not present

## 2021-08-25 DIAGNOSIS — G4733 Obstructive sleep apnea (adult) (pediatric): Secondary | ICD-10-CM | POA: Diagnosis present

## 2021-08-25 DIAGNOSIS — F17210 Nicotine dependence, cigarettes, uncomplicated: Secondary | ICD-10-CM | POA: Diagnosis not present

## 2021-08-25 DIAGNOSIS — I4821 Permanent atrial fibrillation: Secondary | ICD-10-CM | POA: Diagnosis present

## 2021-08-25 DIAGNOSIS — Z8249 Family history of ischemic heart disease and other diseases of the circulatory system: Secondary | ICD-10-CM

## 2021-08-25 DIAGNOSIS — I4819 Other persistent atrial fibrillation: Secondary | ICD-10-CM | POA: Diagnosis not present

## 2021-08-25 DIAGNOSIS — I482 Chronic atrial fibrillation, unspecified: Secondary | ICD-10-CM | POA: Diagnosis not present

## 2021-08-25 DIAGNOSIS — R5381 Other malaise: Secondary | ICD-10-CM | POA: Diagnosis not present

## 2021-08-25 DIAGNOSIS — Z79899 Other long term (current) drug therapy: Secondary | ICD-10-CM | POA: Diagnosis not present

## 2021-08-25 DIAGNOSIS — M25551 Pain in right hip: Secondary | ICD-10-CM | POA: Diagnosis present

## 2021-08-25 DIAGNOSIS — Z85828 Personal history of other malignant neoplasm of skin: Secondary | ICD-10-CM | POA: Diagnosis not present

## 2021-08-25 DIAGNOSIS — I5032 Chronic diastolic (congestive) heart failure: Secondary | ICD-10-CM | POA: Diagnosis not present

## 2021-08-25 DIAGNOSIS — G479 Sleep disorder, unspecified: Secondary | ICD-10-CM | POA: Diagnosis not present

## 2021-08-25 LAB — GLUCOSE, CAPILLARY
Glucose-Capillary: 123 mg/dL — ABNORMAL HIGH (ref 70–99)
Glucose-Capillary: 123 mg/dL — ABNORMAL HIGH (ref 70–99)
Glucose-Capillary: 125 mg/dL — ABNORMAL HIGH (ref 70–99)
Glucose-Capillary: 129 mg/dL — ABNORMAL HIGH (ref 70–99)
Glucose-Capillary: 147 mg/dL — ABNORMAL HIGH (ref 70–99)
Glucose-Capillary: 175 mg/dL — ABNORMAL HIGH (ref 70–99)

## 2021-08-25 MED ORDER — INSULIN ASPART 100 UNIT/ML IJ SOLN
0.0000 [IU] | Freq: Three times a day (TID) | INTRAMUSCULAR | Status: DC
  Administered 2021-08-25 – 2021-08-26 (×3): 3 [IU] via SUBCUTANEOUS

## 2021-08-25 MED ORDER — LIVING WELL WITH DIABETES BOOK
Freq: Once | Status: AC
  Filled 2021-08-25: qty 1

## 2021-08-25 MED ORDER — ACETAMINOPHEN 325 MG PO TABS
650.0000 mg | ORAL_TABLET | Freq: Four times a day (QID) | ORAL | Status: DC | PRN
  Administered 2021-08-25 – 2021-08-30 (×2): 650 mg via ORAL
  Filled 2021-08-25 (×2): qty 2

## 2021-08-25 MED ORDER — BISACODYL 10 MG RE SUPP: 10.0000 mg | Freq: Every day | RECTAL | Status: DC | PRN

## 2021-08-25 MED ORDER — ALBUTEROL SULFATE (2.5 MG/3ML) 0.083% IN NEBU: 2.5000 mg | INHALATION_SOLUTION | RESPIRATORY_TRACT | Status: DC | PRN

## 2021-08-25 MED ORDER — ARIPIPRAZOLE 2 MG PO TABS
2.0000 mg | ORAL_TABLET | Freq: Every day | ORAL | Status: DC
  Administered 2021-08-26 – 2021-08-30 (×5): 2 mg via ORAL
  Filled 2021-08-25 (×5): qty 1

## 2021-08-25 MED ORDER — FUROSEMIDE 20 MG PO TABS
20.0000 mg | ORAL_TABLET | Freq: Two times a day (BID) | ORAL | Status: DC
  Administered 2021-08-25: 18:00:00 20 mg via ORAL
  Filled 2021-08-25: qty 1

## 2021-08-25 MED ORDER — DOCUSATE SODIUM 100 MG PO CAPS
100.0000 mg | ORAL_CAPSULE | Freq: Every day | ORAL | Status: DC
  Administered 2021-08-26 – 2021-08-30 (×2): 100 mg via ORAL
  Filled 2021-08-25 (×5): qty 1

## 2021-08-25 MED ORDER — ARIPIPRAZOLE 2 MG PO TABS
2.0000 mg | ORAL_TABLET | Freq: Every day | ORAL | 0 refills | Status: DC
  Filled 2021-08-25: qty 90, 90d supply, fill #0

## 2021-08-25 MED ORDER — METFORMIN HCL 500 MG PO TABS
ORAL_TABLET | ORAL | 0 refills | Status: DC
  Filled 2021-08-25: qty 180, 90d supply, fill #0

## 2021-08-25 MED ORDER — APIXABAN 5 MG PO TABS
5.0000 mg | ORAL_TABLET | Freq: Two times a day (BID) | ORAL | Status: DC
  Administered 2021-08-25 – 2021-08-30 (×10): 5 mg via ORAL
  Filled 2021-08-25 (×10): qty 1

## 2021-08-25 MED ORDER — COVID-19MRNA BIVAL VACC PFIZER 30 MCG/0.3ML IM SUSP: 0.3000 mL | Freq: Once | INTRAMUSCULAR | Status: DC

## 2021-08-25 MED ORDER — ESCITALOPRAM OXALATE 10 MG PO TABS
20.0000 mg | ORAL_TABLET | Freq: Every day | ORAL | Status: DC
  Administered 2021-08-26 – 2021-08-30 (×5): 20 mg via ORAL
  Filled 2021-08-25 (×5): qty 2

## 2021-08-25 MED ORDER — ACETAMINOPHEN 650 MG RE SUPP: 650.0000 mg | Freq: Four times a day (QID) | RECTAL | Status: DC | PRN

## 2021-08-25 MED ORDER — TRAZODONE HCL 50 MG PO TABS
ORAL_TABLET | ORAL | 1 refills | Status: DC
  Filled 2021-08-25: qty 180, 90d supply, fill #0

## 2021-08-25 MED ORDER — BLOOD PRESSURE CONTROL BOOK
Freq: Once | Status: AC
  Filled 2021-08-25: qty 1

## 2021-08-25 MED ORDER — ATORVASTATIN CALCIUM 80 MG PO TABS
80.0000 mg | ORAL_TABLET | Freq: Every day | ORAL | Status: DC
  Administered 2021-08-25 – 2021-08-29 (×5): 80 mg via ORAL
  Filled 2021-08-25 (×5): qty 1

## 2021-08-25 MED ORDER — EXERCISE FOR HEART AND HEALTH BOOK
Freq: Once | Status: AC
Start: 2021-08-25 — End: 2021-08-25
  Filled 2021-08-25: qty 1

## 2021-08-25 MED ORDER — METOPROLOL TARTRATE 50 MG PO TABS
100.0000 mg | ORAL_TABLET | Freq: Two times a day (BID) | ORAL | Status: DC
  Administered 2021-08-25 – 2021-08-28 (×4): 100 mg via ORAL
  Filled 2021-08-25 (×7): qty 2

## 2021-08-25 MED ORDER — ACETAMINOPHEN 160 MG/5ML PO SOLN: 650.0000 mg | Freq: Four times a day (QID) | ORAL | Status: DC | PRN

## 2021-08-25 MED ORDER — PANTOPRAZOLE SODIUM 40 MG PO TBEC
40.0000 mg | DELAYED_RELEASE_TABLET | Freq: Every day | ORAL | Status: DC
  Administered 2021-08-26 – 2021-08-30 (×5): 40 mg via ORAL
  Filled 2021-08-25 (×5): qty 1

## 2021-08-25 MED ORDER — EZETIMIBE 10 MG PO TABS
10.0000 mg | ORAL_TABLET | Freq: Every day | ORAL | Status: DC
  Administered 2021-08-26 – 2021-08-30 (×5): 10 mg via ORAL
  Filled 2021-08-25 (×5): qty 1

## 2021-08-25 MED ORDER — TRAZODONE HCL 50 MG PO TABS: 100.0000 mg | ORAL_TABLET | Freq: Every evening | ORAL | Status: DC | PRN

## 2021-08-25 MED ORDER — ADULT MULTIVITAMIN W/MINERALS CH
1.0000 | ORAL_TABLET | Freq: Every day | ORAL | Status: DC
  Administered 2021-08-25: 13:00:00 1 via ORAL
  Filled 2021-08-25: qty 1

## 2021-08-25 MED ORDER — ROPINIROLE HCL 1 MG PO TABS
1.0000 mg | ORAL_TABLET | Freq: Every day | ORAL | Status: DC
  Administered 2021-08-25 – 2021-08-29 (×5): 1 mg via ORAL
  Filled 2021-08-25 (×6): qty 1

## 2021-08-25 NOTE — H&P (Signed)
Physical Medicine and Rehabilitation Admission H&P  CC: pulmonary debility   HPI: Shaun [name]. Shaun [name] is a 65 year old right-handed male with history of anxiety, diastolic congestive heart failure, diabetes mellitus, hyperlipidemia, hypertension, obstructive sleep apnea/CPAP, PAF, COPD/tobacco use, atrial fibrillation maintained on Eliquis.,  November 2019 EGD with dilation of low-grade Schatzki's ring.  Per chart review patient lives with spouse.  1 level home one-step to entry.  Modified independent prior to admission.  Presented 08/06/2021 to [facility] with recent diagnosis of flu.  She was given steroid and Tessalon Perles per her report.  She spiked a fever to 102 with minimal relief.  She did have a cough only occasionally productive with green sputum.  No hemoptysis.  He did have some chest tightness and mild shortness of breath with exertion.  Patient had been vaccinated for COVID but not flu.  He was placed on Tamiflu in the ED temperature 99.7 heart rate 101-125 oxygen saturation 96% on 3 L no leukocytosis noted hemoglobin 15.  Chest x-ray no active disease.  Patient did require intubation for a short time.  She was transferred to [facility].  His Tamiflu has since been completed.  Hospital course complicated by her atrial fibrillation requiring IV amiodarone.  Echocardiogram with ejection fraction of 60 to 65% no wall motion abnormalities.  Initial nasogastric tube for nutritional support his diet was advanced to regular.  Therapy evaluations completed due to patient decreased functional mobility was admitted for a comprehensive rehab program. His wife is at bedside and says she will be present throughout his rehab stay.  Review of Systems  Constitutional:  Positive for fever and malaise/fatigue.  HENT:  Negative for hearing loss.   Eyes:  Negative for blurred vision and double vision.  Respiratory:  Positive for cough and shortness of breath. Negative for wheezing.    Cardiovascular:  Positive for chest pain, palpitations and leg swelling.  Gastrointestinal:  Positive for constipation. Negative for heartburn, nausea and vomiting.       GERD  Genitourinary:  Positive for urgency. Negative for dysuria, flank pain and hematuria.  Musculoskeletal:  Positive for joint pain and myalgias.  Skin:  Negative for rash.  Neurological:  Positive for weakness.  Psychiatric/Behavioral:  Positive for depression. The patient has insomnia.        Anxiety  All other systems reviewed and are negative. Past Medical History:  Diagnosis Date   Anemia    Anxiety    Asthma    CAD (coronary artery disease)    DES to distal circumflex 2016   Cataract    Colon polyps    30 colon polyps found on first colonoscopy   Diastolic heart failure (HCC)    Diverticulitis    DJD (degenerative joint disease)    GERD (gastroesophageal reflux disease)    History of kidney stones    Hyperlipidemia    Hypertension    Insomnia    Obstructive sleep apnea 12/2009   01/26/2010 AHI 83/hr   Permanent atrial fibrillation ([place])    Onset 2006 paroxysmal then progressive to persistent   PUD (peptic ulcer disease)    1980s   RLS (restless legs syndrome)    Sinusitis    Skin cancer    Type 2 diabetes mellitus ([place])    Past Surgical History:  Procedure Laterality Date   BIOPSY  07/17/2018   Procedure: BIOPSY;  Surgeon: Danie Binder, MD;  [facility];  Service: Endoscopy;;  colon   BOWEL  RESECTION  09/17/2018   SMALL BOWEL RESECTION: 71 CM    CARDIAC CATHETERIZATION N/A 07/21/2015   Procedure: Left Heart Cath and Coronary Angiography;  Surgeon: Peter M Martinique, MD;  Location: [place] [facility];  Service: Cardiovascular;  Laterality: N/A;   CARDIAC CATHETERIZATION N/A 07/21/2015   Procedure: Coronary Stent Intervention;  Surgeon: Peter M Martinique, MD;  Location: [place] [facility];  Service: Cardiovascular;  Laterality: N/A;   CIRCUMCISION N/A 04/05/2019   Procedure:  CIRCUMCISION ADULT;  Surgeon: Irine Seal, MD;  Location: [facility];  Service: Urology;  Laterality: N/A;   COLONOSCOPY N/A 05/19/2014   Dr. Barnie Alderman diverticulosis/moderate external hemorrhoids, >20 simple adenomas. Genetic screening negative.    COLONOSCOPY WITH PROPOFOL N/A 07/17/2018   Dr. Oneida Alar: Diverticulosis, external/internal hemorrhoids, 32 colon polyps removed.  ten tubular adenomas removed with no high-grade dysplasia.  Advised to have surveillance colonoscopy in 3 years.   COLONOSCOPY WITH PROPOFOL N/A 06/07/2021   Procedure: COLONOSCOPY WITH PROPOFOL;  Surgeon: Eloise Harman, DO;  [facility];  Service: Endoscopy;  Laterality: N/A;  9:30 / ASA 3  (Pt was told that his time will be given at Pre-op)   ESOPHAGOGASTRODUODENOSCOPY (EGD) WITH PROPOFOL N/A 07/17/2018   Dr. Oneida Alar: Low-grade narrowing Schatzki ring at the GE junction status post dilation.  Gastritis.  Biopsy with mild nonspecific reactive gastropathy.  No H. pylori.   GIVENS CAPSULE STUDY N/A 06/24/2019   normal   HERNIA REPAIR  1986   Left inguinal   INTRAVASCULAR PRESSURE WIRE/FFR STUDY Left 06/08/2017   Procedure: INTRAVASCULAR PRESSURE WIRE/FFR STUDY;  Surgeon: Nelva Bush, MD;  Location: [place] [facility];  Service: Cardiovascular;  Laterality: Left;  LAD and CFX   LAPAROTOMY N/A 09/17/2018   Procedure: EXPLORATORY LAPAROTOMY;  Surgeon: Virl Cagey, MD;  Location: [facility];  Service: General;  Laterality: N/A;   LEFT HEART CATH AND CORONARY ANGIOGRAPHY N/A 06/08/2017   Procedure: LEFT HEART CATH AND CORONARY ANGIOGRAPHY;  Surgeon: Nelva Bush, MD;  Location: [place] [facility];  Service: Cardiovascular;  Laterality: N/A;   POLYPECTOMY  07/17/2018   Procedure: POLYPECTOMY;  Surgeon: Danie Binder, MD;  [facility];  Service: Endoscopy;;  colon   POLYPECTOMY  06/07/2021   Procedure: POLYPECTOMY INTESTINAL;  Surgeon: Eloise Harman, DO;  [facility];  Service:  Endoscopy;;   ROTATOR CUFF REPAIR     Right   SAVORY DILATION N/A 07/17/2018   Procedure: SAVORY DILATION;  Surgeon: Danie Binder, MD;  [facility];  Service: Endoscopy;  Laterality: N/A;   Family History  Problem Relation Age of Onset   Hypertension Mother    Breast cancer Mother 79       brain/bone    Heart attack Father    Skin cancer Sister 38   Diabetes Brother    Parkinson's disease Brother    Brain cancer Maternal Uncle    Cancer Maternal Uncle        NOS   Breast cancer Cousin        maternal cousin dx <50   Cancer Cousin    Colon cancer Neg Hx    Social History:  reports that he has been smoking cigarettes. He started smoking about 51 years ago. He has a 19.50 pack-year smoking history. He has never used smokeless tobacco. He reports that he does not drink alcohol and does not use drugs. Allergies: No Known Allergies Medications Prior to Admission  Medication Sig  Dispense Refill   albuterol (VENTOLIN HFA) 108 (90 Base) MCG/ACT inhaler INHALE 2 PUFFS INTO THE LUNGS EVERY 6 HOURS AS NEEDED FOR WHEEZING OR SHORTNESS OF BREATH 18 g 11   apixaban (ELIQUIS) 5 MG TABS tablet TAKE 1 TABLET (5 MG) BY MOUTH TWICE DAILY 180 tablet 1   atorvastatin (LIPITOR) 80 MG tablet Take 1 tablet (80 mg total) by mouth daily. TAKE 1 TABLET(80 MG) BY MOUTH EVERY EVENING 90 tablet 3   benzonatate (TESSALON PERLES) 100 MG capsule Take 1 capsule (100 mg total) by mouth 3 (three) times daily as needed. 20 capsule 0   docusate sodium (COLACE) 100 MG capsule Take 100 mg by mouth daily as needed for mild constipation.     escitalopram (LEXAPRO) 20 MG tablet TAKE 1 TABLET(20 MG) BY MOUTH DAILY 90 tablet 0   ezetimibe (ZETIA) 10 MG tablet TAKE 1 TABLET BY MOUTH EVERY DAY 90 tablet 3   ferrous sulfate 325 (65 FE) MG tablet Take 325 mg by mouth daily with breakfast.     furosemide (LASIX) 40 MG tablet Take 1 tablet (40 mg total) by mouth daily. 90 tablet 3   meclizine (ANTIVERT) 25 MG tablet  Take 1 tablet (25 mg total) by mouth 3 (three) times daily as needed for dizziness. 30 tablet 0   metoprolol tartrate (LOPRESSOR) 100 MG tablet Take 1 tablet (100 mg total) by mouth 2 (two) times daily. 180 tablet 0   nitroGLYCERIN (NITROSTAT) 0.4 MG SL tablet Place 1 tablet (0.4 mg total) under the tongue every 5 (five) minutes as needed. 25 tablet 3   omeprazole (PRILOSEC) 40 MG capsule Take 1 capsule by mouth once daily 30 minutes before breakfast 90 capsule 3   ondansetron (ZOFRAN) 4 MG tablet Take 1 tablet (4 mg total) by mouth every 8 (eight) hours as needed for nausea or vomiting. 30 tablet 1   potassium chloride SA (KLOR-CON) 20 MEQ tablet Take 1 tablet (20 mEq total) by mouth daily. 90 tablet 3   rOPINIRole (REQUIP) 3 MG tablet TAKE 1 TABLET BY MOUTH EVERY NIGHT AT BEDTIME 450 tablet 0   sucralfate (CARAFATE) 1 g tablet Take one tablet po BID PRN (Patient taking differently: Take 1 g by mouth 2 (two) times daily as needed (acid reflux).) 42 tablet 5    Drug Regimen Review Drug regimen was reviewed and remains appropriate with no significant issues identified.  Home: Home Living Family/patient expects to be discharged to:: Inpatient rehab Living Arrangements: Spouse/significant other Available Help at Discharge: Family, Available 24 hours/day Type of Home: House Home Access: Stairs to enter [employer] of Steps: 1 Home Layout: One level Bathroom Shower/Tub: Gaffer, Door Bathroom Toilet: [place] - single point Additional Comments: Does wood working in his shop, could be up and about walking 50 yards before he got tired.   Functional History: Prior Function Prior Level of Function : Independent/Modified Independent, Driving Mobility Comments: pt mobilizing ~50 yards at a time before he gets fatigued, no DME ADLs Comments: pt reports independent but unable to complete UE dressing due to rotator cuff issues   Functional Status:   Mobility: Bed Mobility Overal bed mobility: Needs Assistance Bed Mobility: Supine to Sit Rolling: Mod assist, +2 for physical assistance Sidelying to sit: Mod assist, +2 for physical assistance Supine to sit: Min assist, HOB elevated General bed mobility comments: pt OOB sitting in recliner chair upon PT arrival Transfers Overall transfer level: Needs assistance Equipment used:  (STEDY) Transfers: Sit  to/from Stand Sit to Stand: Min assist, +2 physical assistance, +2 safety/equipment Bed to/from chair/wheelchair/BSC transfer type:: Via Lift equipment Squat pivot transfers: Total assist, +2 physical assistance Transfer via Lift Equipment: Stedy General transfer comment: pt performing sit-to-stand x1 from recliner chair using the STEDY and min A x2 to power up into a full standing position with verbal cueing to correct posture. He then performed sit<>stand x4 in the STEDY from the pads of the equipment with min guard. He was limited secondary to R hip pain with standing endurance and amount of times performing the transfer Ambulation/Gait General Gait Details: unable   ADL: ADL Overall ADL's : Needs assistance/impaired Eating/Feeding: NPO Grooming: Minimal assistance, Bed level Upper Body Bathing: Moderate assistance Lower Body Bathing: Maximal assistance Upper Body Dressing : Total assistance, Bed level Lower Body Dressing: Total assistance Toilet Transfer: Total assistance, +2 for physical assistance, Squat-pivot Toilet Transfer Details (indicate cue type and reason): simulated bed to recliner with use of gait belt and bed pad Toileting- Clothing Manipulation and Hygiene: Total assistance Functional mobility during ADLs: Total assistance (use of Stedy; pt alb eto stand form paddles with min A)   Cognition: Cognition Overall Cognitive Status: Impaired/Different from baseline Orientation Level: Oriented X4 Cognition Arousal/Alertness: Awake/alert Behavior During Therapy: WFL  for tasks assessed/performed Overall Cognitive Status: Impaired/Different from baseline Area of Impairment: Safety/judgement, Problem solving Orientation Level: Disoriented to, Time Current Attention Level: Selective Memory: Decreased short-term memory Safety/Judgement: Decreased awareness of safety, Decreased awareness of deficits Awareness: Emergent Problem Solving: Slow processing, Difficulty sequencing, Requires verbal cues General Comments: When asked initially if he had any problems with his shoulders he said no, but as we talked and worked with him he did report he had Bil rotator cuff issues  Physical Exam: Blood pressure 99/70, pulse 91, temperature 98.1 F (36.7 C), temperature source Oral, resp. rate 20, height 5\' 7"  (1.702 m), weight 89.7 kg, SpO2 92 %. Gen: no distress, normal appearing. BMI 30.98. Wife is at bedside.  HEENT: oral mucosa pink and moist, NCAT, [place] in place at 2L Cardio: Irregular rhythm, rate controlled Chest: normal effort, normal rate of breathing Abd: soft, non-distended Ext: no edema Psych: pleasant, normal affect Skin: intact Neurological:     Comments: Patient is alert.  No acute distress except mild anxiety.  Oriented x4. Slow processing. Impaired short term memory. Diffuse weakness without focal deficits.   Results for orders placed or performed during the hospital encounter of 08/25/21 (from the past 48 hour(s))  Glucose, capillary     Status: Abnormal   Collection Time: 08/25/21  5:27 PM  Result Value Ref Range   Glucose-Capillary 125 (H) 70 - 99 mg/dL    Comment: Glucose reference range applies only to samples taken after fasting for at least 8 hours.   DG Swallowing Func-Speech Pathology  Result Date: 08/24/2021 Table formatting from the original result was not included. Objective Swallowing Evaluation: Type of Study: MBS-Modified Barium Swallow Study  Patient Details Name: Shaun [name] MRN: [id number] Date of Birth: Nov 18, 1955 Today's  Date: 08/24/2021 Time: SLP Start Time (ACUTE ONLY): 0845 -SLP Stop Time (ACUTE ONLY): 0915 SLP Time Calculation (min) (ACUTE ONLY): 30 min Past Medical History: Past Medical History: Diagnosis Date  Anemia   Anxiety   Asthma   CAD (coronary artery disease)   DES to distal circumflex 2016  Cataract   Colon polyps   30 colon polyps found on first colonoscopy  Diastolic heart failure (HCC)   Diverticulitis   DJD (degenerative  joint disease)   GERD (gastroesophageal reflux disease)   History of kidney stones   Hyperlipidemia   Hypertension   Insomnia   Obstructive sleep apnea 12/2009  01/26/2010 AHI 83/hr  Permanent atrial fibrillation ([place])   Onset 2006 paroxysmal then progressive to persistent  PUD (peptic ulcer disease)   1980s  RLS (restless legs syndrome)   Sinusitis   Skin cancer   Type 2 diabetes mellitus ([place])  Past Surgical History: Past Surgical History: Procedure Laterality Date  BIOPSY  07/17/2018  Procedure: BIOPSY;  Surgeon: Danie Binder, MD;  [facility];  Service: Endoscopy;;  colon  BOWEL RESECTION  09/17/2018  SMALL BOWEL RESECTION: 71 CM   CARDIAC CATHETERIZATION N/A 07/21/2015  Procedure: Left Heart Cath and Coronary Angiography;  Surgeon: Peter M Martinique, MD;  Location: [place] [facility];  Service: Cardiovascular;  Laterality: N/A;  CARDIAC CATHETERIZATION N/A 07/21/2015  Procedure: Coronary Stent Intervention;  Surgeon: Peter M Martinique, MD;  Location: [place] [facility];  Service: Cardiovascular;  Laterality: N/A;  CIRCUMCISION N/A 04/05/2019  Procedure: CIRCUMCISION ADULT;  Surgeon: Irine Seal, MD;  Location: [facility];  Service: Urology;  Laterality: N/A;  COLONOSCOPY N/A 05/19/2014  Dr. Barnie Alderman diverticulosis/moderate external hemorrhoids, >20 simple adenomas. Genetic screening negative.   COLONOSCOPY WITH PROPOFOL N/A 07/17/2018  Dr. Oneida Alar: Diverticulosis, external/internal hemorrhoids, 32 colon polyps removed.  ten tubular adenomas removed with no high-grade dysplasia.  Advised to  have surveillance colonoscopy in 3 years.  COLONOSCOPY WITH PROPOFOL N/A 06/07/2021  Procedure: COLONOSCOPY WITH PROPOFOL;  Surgeon: Eloise Harman, DO;  [facility];  Service: Endoscopy;  Laterality: N/A;  9:30 / ASA 3  (Pt was told that his time will be given at Pre-op)  ESOPHAGOGASTRODUODENOSCOPY (EGD) WITH PROPOFOL N/A 07/17/2018  Dr. Oneida Alar: Low-grade narrowing Schatzki ring at the GE junction status post dilation.  Gastritis.  Biopsy with mild nonspecific reactive gastropathy.  No H. pylori.  GIVENS CAPSULE STUDY N/A 06/24/2019  normal  HERNIA REPAIR  1986  Left inguinal  INTRAVASCULAR PRESSURE WIRE/FFR STUDY Left 06/08/2017  Procedure: INTRAVASCULAR PRESSURE WIRE/FFR STUDY;  Surgeon: Nelva Bush, MD;  Location: [place] [facility];  Service: Cardiovascular;  Laterality: Left;  LAD and CFX  LAPAROTOMY N/A 09/17/2018  Procedure: EXPLORATORY LAPAROTOMY;  Surgeon: Virl Cagey, MD;  [facility];  Service: General;  Laterality: N/A;  LEFT HEART CATH AND CORONARY ANGIOGRAPHY N/A 06/08/2017  Procedure: LEFT HEART CATH AND CORONARY ANGIOGRAPHY;  Surgeon: Nelva Bush, MD;  Location: [place] [facility];  Service: Cardiovascular;  Laterality: N/A;  POLYPECTOMY  07/17/2018  Procedure: POLYPECTOMY;  Surgeon: Danie Binder, MD;  [facility];  Service: Endoscopy;;  colon  POLYPECTOMY  06/07/2021  Procedure: POLYPECTOMY INTESTINAL;  Surgeon: Eloise Harman, DO;  [facility];  Service: Endoscopy;;  ROTATOR CUFF REPAIR    Right  SAVORY DILATION N/A 07/17/2018  Procedure: SAVORY DILATION;  Surgeon: Danie Binder, MD;  [facility];  Service: Endoscopy;  Laterality: N/A; HPI: 65 y/o male presented to ED 12/9 with flu/fever/dyspnea; desaturated 12/12 requiring bipap then intubation; extubated 12/22.  PMHx of active smoker, anxiety, CAD, permanent afib,  COPD, DMII, HTN, HHLD, OSA. Nov 2019 EGD with dilation of low grade Schatzi's ring.  Subjective: pt  alert, pleasant  Recommendations for follow up therapy are one component of a multi-disciplinary discharge planning process, led by the attending physician.  Recommendations may be updated based on patient status, additional functional criteria  and insurance authorization. Assessment / Plan / Recommendation Clinical Impressions 08/24/2021 Clinical Impression Pt demonstrates significant improvement in swallowing. Only mild pharyngeal weakness resuling in trace high vestibular penetration and mild vallecular residue without pt awareness. He was primarily in a chin tuck position during test, but it did not appear particularly beneficial. Pt cued to take dry swallows and use a liquid wash, which was helpful. Pt recommended to consume a regular diet and thin liquids with basic precautions. SLP Visit Diagnosis Dysphagia, pharyngeal phase (R13.13) Attention and concentration deficit following -- Frontal lobe and executive function deficit following -- Impact on safety and function Mild aspiration risk   Treatment Recommendations 08/24/2021 Treatment Recommendations Therapy as outlined in treatment plan below   Prognosis 08/20/2021 Prognosis for Safe Diet Advancement Good Barriers to Reach Goals -- Barriers/Prognosis Comment -- Diet Recommendations 08/24/2021 SLP Diet Recommendations Regular solids;Thin liquid Liquid Administration via Cup;Straw Medication Administration Whole meds with liquid Compensations Slow rate;Small sips/bites;Multiple dry swallows after each bite/sip;Hard cough after swallow Postural Changes --   Other Recommendations 08/24/2021 Recommended Consults -- Oral Care Recommendations Oral care BID Other Recommendations -- Follow Up Recommendations Acute inpatient rehab (3hours/day) Assistance recommended at discharge Intermittent Supervision/Assistance Functional Status Assessment Patient has had a recent decline in their functional status and demonstrates the ability to make significant improvements in  function in a reasonable and predictable amount of time. Frequency and Duration  08/24/2021 Speech Therapy Frequency (ACUTE ONLY) min 2x/week Treatment Duration 2 weeks   Oral Phase 08/24/2021 Oral Phase WFL Oral - Pudding Teaspoon -- Oral - Pudding Cup -- Oral - Honey Teaspoon -- Oral - Honey Cup -- Oral - Nectar Teaspoon -- Oral - Nectar Cup -- Oral - Nectar Straw -- Oral - Thin Teaspoon -- Oral - Thin Cup -- Oral - Thin Straw -- Oral - Puree -- Oral - Mech Soft -- Oral - Regular -- Oral - Multi-Consistency -- Oral - Pill -- Oral Phase - Comment --  Pharyngeal Phase 08/24/2021 Pharyngeal Phase Impaired Pharyngeal- Pudding Teaspoon -- Pharyngeal -- Pharyngeal- Pudding Cup -- Pharyngeal -- Pharyngeal- Honey Teaspoon NT Pharyngeal -- Pharyngeal- Honey Cup NT Pharyngeal -- Pharyngeal- Nectar Teaspoon NT Pharyngeal -- Pharyngeal- Nectar Cup Reduced tongue base retraction;Reduced epiglottic inversion;Pharyngeal residue - valleculae Pharyngeal Material does not enter airway Pharyngeal- Nectar Straw -- Pharyngeal -- Pharyngeal- Thin Teaspoon -- Pharyngeal -- Pharyngeal- Thin Cup Reduced epiglottic inversion;Reduced tongue base retraction;Penetration/Aspiration during swallow;Pharyngeal residue - valleculae Pharyngeal Material enters airway, remains ABOVE vocal cords and not ejected out;Material does not enter airway Pharyngeal- Thin Straw Reduced epiglottic inversion;Reduced tongue base retraction;Penetration/Aspiration during swallow;Pharyngeal residue - valleculae Pharyngeal Material enters airway, remains ABOVE vocal cords and not ejected out;Material does not enter airway Pharyngeal- Puree Reduced tongue base retraction;Reduced epiglottic inversion;Pharyngeal residue - valleculae Pharyngeal Material does not enter airway Pharyngeal- Mechanical Soft -- Pharyngeal -- Pharyngeal- Regular Reduced epiglottic inversion;Reduced tongue base retraction;Pharyngeal residue - valleculae Pharyngeal -- Pharyngeal- Multi-consistency  -- Pharyngeal -- Pharyngeal- Pill Pharyngeal residue - pyriform;Reduced tongue base retraction Pharyngeal -- Pharyngeal Comment --  Cervical Esophageal Phase  08/20/2021 Cervical Esophageal Phase WFL Pudding Teaspoon -- Pudding Cup -- Honey Teaspoon -- Honey Cup -- Nectar Teaspoon -- Nectar Cup -- Nectar Straw -- Thin Teaspoon -- Thin Cup -- Thin Straw -- Puree -- Mechanical Soft -- Regular -- Multi-consistency -- Pill -- Cervical Esophageal Comment -- Shaun Baltimore, MA CCC-SLP Acute Rehabilitation Services Office [phone] Lynann Beaver 08/24/2021, 10:10 AM  Medical Problem List and Plan: 1.  Debility functional deficits secondary to acute respiratory failure/COPD/OSA  -patient may shower  -ELOS/Goals: 14-21 days MinA 2.  Antithrombotics: -DVT/anticoagulation:  Pharmaceutical: Other (comment) Eliquis  -antiplatelet therapy: N/A 3. Pain Management: Tylenol as needed 4. Depression:  Continue Lexapro 20 mg daily  -antipsychotic agents: Abilify 2 mg daily 5. Neuropsych: This patient is capable of making decisions on his own behalf. 6. Skin/Wound Care: Routine skin checks 7. Fluids/Electrolytes/Nutrition: Routine in and outs with follow-up chemistries 8.  Diastolic congestive heart failure.  Lasix 20 mg twice daily.  Monitor for any signs of fluid overload 9.  Hyperlipidemia Lipitor/Zetia 10.  Diabetes mellitus.  Hemoglobin A1c 6.9.  SSI 11.  Atrial fibrillation.  Continue Eliquis.  Lopressor 100 mg twice daily.  Cardiac rate controlled.  Follow-up cardiology service. 12.  COPD/tobacco use/OSA.  Continue CPAP.  Provide counsel regarding cessation of nicotine products. 13.  GERD.  Continue Protonix 14. Insomnia: continue trazodone 100mg  HS prn  I have personally performed a face to face diagnostic evaluation, including, but not limited to relevant history and physical exam findings, of this patient and developed relevant assessment and plan.  Additionally, I  have reviewed and concur with the physician assistant's documentation above.   Izora Ribas, MD 08/25/2021   Lavon Paganini Angiulli, PA-C

## 2021-08-25 NOTE — Progress Notes (Signed)
Patient transferred to 4MW room 1 via wheelchair with all belongings.

## 2021-08-25 NOTE — Discharge Summary (Signed)
Physician Discharge Summary   Patient: Shaun [name] MRN: [id number] DOB: @DOB   Admit date:     08/06/2021  Discharge date: 08/25/21  Discharge Physician: Annita Brod   PCP: Maximiano Coss, NP   Recommendations at discharge: 1.  Medication change: Wellbutrin 450 mg p.o. daily discontinue 2. medication change: Viagra discontinued (patient on as needed nitroglycerin) 3. medication change: Lisinopril discontinued 4. patient being discharged to inpatient rehab 5.  Please note that prior to discharge, patient requested and received COVID booster.  Hospital Course   65 year old smoker with history of anxiety, CHF, obesity, diabetes mellitus, obstructive sleep apnea and hypertension and diagnosis of flu 1 week prior admitted for worsening shortness of breath.  Initially admitted to hospitalist service, but within 24 hours, patient started to decompensate was intubated and placed on ventilator by 12/12.  Also required pressors for septic shock.  At that time also went into rapid atrial fibrillation.  Placed on antibiotics for concern for post fluid bacterial pneumonia.  Patient able to be weaned off of pressors.  [facility] ICU on 12[facility]17 from [facility].  Extubated on 12/21 and down to 3.5 L nasal cannula at 98%.  Seen by cardiology and had been on IV amiodarone, changed to p.o. metoprolol following extubation.  Apixaban started.  Transferred to hospitalist service starting 12/24.  Physical therapy recommending inpatient rehab, although it looks like insurance will not cover this.  Possible skilled nursing versus home health.    In the interim, found to have some dysphagia, secondary to deconditioning.  Has had NG tube placed and Osmolite initiated on 12/25.  Not tolerating NG tube and it was removed.  Therapy did modified barium swallow and patient on 12/27 and swelling has since improved and restrictions discontinued.  Patient developed some shortness of breath on 12/25 and found  to be mildly volume overloaded and responded to IV Lasix.  He was accepted to inpatient rehab on 12/28.  Cardiovascular and Mediastinum Essential hypertension Assessment & Plan Blood pressure stable.  Chronic diastolic CHF (congestive heart failure) (HCC) Assessment & Plan BNP on admission normal, although with diastolic failure, not always elevated.  Episodes on the night of 12/25 of shortness of breath and cough and with Lasix, significantly improved.  Diuresed -1.5 L by time of discharge.  Chronic atrial fibrillation (HCC) Assessment & Plan Rate controlled.  Now metoprolol.  Lovenox changed to apixaban  Respiratory Obstructive sleep apnea Assessment & Plan Continue nightly CPAP  COPD (chronic obstructive pulmonary disease) ([place]) Assessment & Plan Stable at this time.  Continue nebulizers, oxygen and steroids.  Steroids tapered off, oxygen discontinued.  Influenza Assessment & Plan Improving.  Finished course of Tamiflu.  * Acute respiratory failure with hypoxemia [facility]) Assessment & Plan Improving.  Extubated, continues to improve, currently down to 2 L nasal cannula.  Some of this has been in response to diuresing.  By 12/28, on room air.  Digestive Dysphagia Assessment & Plan Due to overall deconditioning, time on ventilator.  Seen by speech therapy and initially patient placed on restrictions.  NG tube placed on 12/24 started Osmolite.  However, patient could not tolerate and tube was discontinued on 12/26.  Speech therapy evaluated patient on 12/27 and swallowing much improved.  Dietary restrictions have been discontinued.  Endocrine Non-insulin treated type 2 diabetes mellitus ([place]) Assessment & Plan CBG stable, under 200  Other Tobacco abuse Assessment & Plan Counseled.  Nicotine patch.  RLS (restless legs syndrome) Assessment & Plan Have restarted Requip  and trazodone at night.  Patient having improvement  Obesity (BMI 30-39.9) Assessment & Plan Meets  criteria for BMI greater than 30  Sundowning Assessment & Plan Wife noted patient gets confused sometimes at night saying bizarre things.  Encourage that he be kept from napping during the day and that blinds be lifted to show sunlight.  Patient alert and oriented x3 during daytime.  This seemed to help.  Hypernatremia Assessment & Plan Sodium on 12/24 at 147.  Improved and since normalized with IV fluids.  Endotracheally intubated Assessment & Plan Off of ventilator.     Consultants: Critical care, cardiology Procedures performed:  Echocardiogram: Indeterminate diastolic function, preserved ejection fraction Patient on ventilator 12/12-12/21  Disposition: Rehabilitation facility Diet recommendation: Cardiac diet  DISCHARGE MEDICATION: Allergies as of 08/25/2021   No Known Allergies      Medication List     STOP taking these medications    buPROPion 150 MG 24 hr tablet Commonly known as: Wellbutrin XL   lisinopril 2.5 MG tablet Commonly known as: ZESTRIL   sildenafil 100 MG tablet Commonly known as: VIAGRA   traMADol 50 MG tablet Commonly known as: ULTRAM       TAKE these medications    albuterol 108 (90 Base) MCG/ACT inhaler Commonly known as: Ventolin HFA INHALE 2 PUFFS INTO THE LUNGS EVERY 6 HOURS AS NEEDED FOR WHEEZING OR SHORTNESS OF BREATH   apixaban 5 MG Tabs tablet Commonly known as: Eliquis TAKE 1 TABLET(5 MG) BY MOUTH TWICE DAILY   ARIPiprazole 2 MG tablet Commonly known as: Abilify Take 1 tablet (2 mg total) by mouth daily.   atorvastatin 80 MG tablet Commonly known as: LIPITOR Take 1 tablet (80 mg total) by mouth once daily in the evening.  Please follow up in 2-3 months. (Take 1 tablet (80 mg total) by mouth daily. TAKE 1 TABLET(80 MG) BY MOUTH EVERY EVENING)   benzonatate 100 MG capsule Commonly known as: Tessalon Perles Take 1 capsule (100 mg total) by mouth 3 (three) times daily as needed.   docusate sodium 100 MG  capsule Commonly known as: COLACE Take 100 mg by mouth daily as needed for mild constipation.   escitalopram 20 MG tablet Commonly known as: LEXAPRO TAKE 1 TABLET(20 MG) BY MOUTH DAILY   ezetimibe 10 MG tablet Commonly known as: ZETIA TAKE 1 TABLET BY MOUTH EVERY DAY   ferrous sulfate 325 (65 FE) MG tablet Take 325 mg by mouth daily with breakfast.   furosemide 40 MG tablet Commonly known as: LASIX Take 1 tablet (40 mg total) by mouth daily.   meclizine 25 MG tablet Commonly known as: ANTIVERT Take 1 tablet (25 mg total) by mouth 3 (three) times daily as needed for dizziness.   metFORMIN 500 MG tablet Commonly known as: GLUCOPHAGE TAKE ONE TABLET (500 MG) BY MOUTH TWICE DAILY WITH A MEAL.   metoprolol tartrate 100 MG tablet Commonly known as: LOPRESSOR Take 1 tablet (100 mg total) by mouth 2 (two) times daily.   nitroGLYCERIN 0.4 MG SL tablet Commonly known as: NITROSTAT Place 1 tablet (0.4 mg total) under the tongue every 5 (five) minutes as needed.   omeprazole 40 MG capsule Commonly known as: PRILOSEC Take 1 capsule by mouth once daily 30 minutes before breakfast   ondansetron 4 MG tablet Commonly known as: ZOFRAN Take 1 tablet (4 mg total) by mouth every 8 (eight) hours as needed for nausea or vomiting.   potassium chloride SA 20 MEQ tablet Commonly known as: KLOR-CON  M Take 1 tablet (20 mEq total) by mouth daily.   rOPINIRole 3 MG tablet Commonly known as: REQUIP TAKE 1 TABLET BY MOUTH EVERY NIGHT AT BEDTIME   sucralfate 1 g tablet Commonly known as: CARAFATE Take one tablet po BID PRN What changed:  how much to take how to take this when to take this reasons to take this additional instructions   traZODone 50 MG tablet Commonly known as: DESYREL TAKE 2 TABLETS (100 MG) BY MOUTH AT BEDTIME         Discharge Exam: Filed Weights   08/21/21 0500 08/22/21 0426 08/25/21 0517  Weight: 94.6 kg 93.7 kg 93.8 kg   General: Alert and oriented x3,  no acute distress Cardiovascular: Irregular rhythm, rate controlled Lungs: Clear to auscultation bilaterally  Condition at discharge: improving  The results of significant diagnostics from this hospitalization (including imaging, microbiology, ancillary and laboratory) are listed below for reference.   Imaging Studies: DG CHEST PORT 1 VIEW  Result Date: 08/23/2021 CLINICAL DATA:  Shortness of breath. EXAM: PORTABLE CHEST 1 VIEW COMPARISON:  Chest radiograph dated 08/17/2021. FINDINGS: Enteric tube extends below the diaphragm with tip beyond the inferior margin of the image. Interval removal of the endotracheal tube and left IJ central venous line. Cardiomegaly with vascular congestion. Bibasilar atelectasis/scarring. No focal consolidation, pleural effusion, pneumothorax. No acute osseous pathology. IMPRESSION: Cardiomegaly with vascular congestion. No focal consolidation. Electronically Signed   By: Anner Crete M.D.   On: 08/23/2021 00:04   DG CHEST PORT 1 VIEW  Result Date: 08/17/2021 CLINICAL DATA:  ETT, respiratory failure with hypoxemia. EXAM: PORTABLE CHEST 1 VIEW COMPARISON:  Comparison is made with August 14, 2021. FINDINGS: LEFT IJ central venous line terminates at the proximal portion of the superior vena cava perhaps advanced slightly since the previous study. Endotracheal tube approximately 3.7 cm from the carina. Gastric tube courses through in off the field of the radiograph. EKG leads project over the chest. Cardiomediastinal contours and hilar structures are stable. No signs of lobar consolidative process or gross evidence of effusion. Subtle patchy LEFT basilar airspace disease. No visible pneumothorax. On limited assessment there is no acute skeletal process. IMPRESSION: LEFT IJ central venous line terminates at the proximal portion of the superior vena cava. Subtle patchy LEFT basilar airspace disease, correlate with any signs of infection. This finding is unchanged since  the recent exam. Electronically Signed   By: Zetta Bills M.D.   On: 08/17/2021 08:38   DG CHEST PORT 1 VIEW  Result Date: 08/14/2021 CLINICAL DATA:  Short of breath.  Follow-up exam. EXAM: PORTABLE CHEST 1 VIEW COMPARISON:  08/12/2021 and older exams. FINDINGS: Interstitial and mild patchy airspace opacities at the lung bases are without significant change from the most recent prior exam. No new lung abnormalities. Endotracheal tube and nasal/orogastric tube are stable. Left internal jugular central venous line is stable. No pneumothorax. IMPRESSION: 1. No significant interval change in lung base opacities when compared to the most recent prior study, and when allowing for differences in patient positioning and technique. No new lung abnormalities. 2. Stable support apparatus. Electronically Signed   By: Lajean Manes M.D.   On: 08/14/2021 09:04   DG Chest Port 1 View  Result Date: 08/12/2021 CLINICAL DATA:  Acute respiratory failure with hypoxemia. EXAM: PORTABLE CHEST 1 VIEW COMPARISON:  08/10/2021 FINDINGS: An endotracheal tube remains in place terminating proximally 5 cm above the carina. An enteric tube courses into the abdomen with tip not imaged. A left  jugular catheter terminates over the upper SVC, unchanged. The patient is rotated to the right with persistent mild enlargement of the cardiac silhouette. Lung volumes remain low with mild central pulmonary vascular congestion. Patchy airspace opacities are present in the left greater than right lung bases with improved aeration of the lung bases compared to the prior study, particularly on the left. No sizable pleural effusion or pneumothorax is identified. IMPRESSION: Improved aeration of the lung bases. Electronically Signed   By: Logan Bores M.D.   On: 08/12/2021 09:06   DG CHEST PORT 1 VIEW  Result Date: 08/11/2021 CLINICAL DATA:  Hypoxia and respiratory failure. EXAM: PORTABLE CHEST 1 VIEW COMPARISON:  Portable chest earlier today at  6:25 a.m. FINDINGS: 08/10/2021 at 11:44 p.m. the heart is enlarged. Central vessels are normal caliber. Emphysematous and chronic interstitial changes are present. Again noted is a small infiltrate in the right upper lobe apex, and hazy atelectasis or pneumonitis in the right base. On the current exam there is increasingly dense left lower lobe consolidation, and small increased left pleural effusion. No other new opacity is seen. ETT tip is 5 cm from the carina, NGT tip in the body of stomach based on the positioning of the proximal side hole with the tip not included, left IJ central line tip in the SVC. IMPRESSION: 1. Increasingly dense left lower lobe consolidation and small left pleural effusion. 2. Unchanged small infiltrate in the right apex, with hazy right basilar atelectasis or pneumonitis. 3. Cardiomegaly without findings of CHF. Electronically Signed   By: Telford Nab M.D.   On: 08/11/2021 00:17   DG CHEST PORT 1 VIEW  Result Date: 08/10/2021 CLINICAL DATA:  Difficulty breathing EXAM: PORTABLE CHEST 1 VIEW COMPARISON:  Previous studies including the examination is done on 08/09/2021 FINDINGS: Transverse diameter of heart is increased. Central pulmonary vessels are slightly more prominent. Interstitial markings in the left parahilar region and lower lung fields appear more prominent. There is no focal consolidation. There is no significant pleural effusion or pneumothorax. Tip of endotracheal is 5.1 cm above the carina. Tip of central venous catheter is seen in the superior vena cava. Enteric tube is noted traversing the esophagus. Distal portion of the enteric tube is seen in the stomach. Tip is not included in the radiograph. IMPRESSION: Cardiomegaly. Interstitial markings in the parahilar regions and lower lung fields appear more prominent. This may suggest mild interstitial edema or interstitial pneumonitis or an artifact due to differences in the techniques. Electronically Signed   By: Elmer Picker M.D.   On: 08/10/2021 09:52   DG CHEST PORT 1 VIEW  Result Date: 08/09/2021 CLINICAL DATA:  Shortness of breath EXAM: PORTABLE CHEST 1 VIEW COMPARISON:  08/07/2021.  09/19/2018. FINDINGS: Chronic cardiomegaly and aortic atherosclerosis. Worsened venous hypertension, possibly with mild interstitial edema. No visible effusion. There could also be a bronchial thickening pattern consistent with bronchitis. IMPRESSION: Probable fluid overload/low level congestive heart failure. Cardiomegaly, venous hypertension and mild interstitial edema. Question also bronchial thickening that could go along with bronchitis. Electronically Signed   By: Nelson Chimes M.D.   On: 08/09/2021 13:17   Portable chest 1 View  Result Date: 08/07/2021 CLINICAL DATA:  65 year old male with respiratory failure.  Smoker. EXAM: PORTABLE CHEST 1 VIEW COMPARISON:  Portable chest 08/06/2021 and earlier. FINDINGS: Portable AP upright view at 0439 hours. Stable lung volumes and mediastinal contours. Borderline cardiomegaly. Stable lung markings. Allowing for portable technique the lungs are clear. No pneumothorax or pleural effusion.  Visualized tracheal air column is within normal limits. No acute osseous abnormality identified. IMPRESSION: No acute cardiopulmonary abnormality. Electronically Signed   By: Genevie Ann M.D.   On: 08/07/2021 04:59   DG Chest Port 1 View  Result Date: 08/06/2021 CLINICAL DATA:  Shortness of breath EXAM: PORTABLE CHEST 1 VIEW COMPARISON:  09/21/2018 FINDINGS: Cardiomegaly. No confluent opacities, effusions or edema. No acute bony abnormality. IMPRESSION: Cardiomegaly.  No active disease. Electronically Signed   By: Rolm Baptise M.D.   On: 08/06/2021 19:45   DG Chest Port 1V same Day  Result Date: 08/09/2021 CLINICAL DATA:  Central line placement. EXAM: PORTABLE CHEST 1 VIEW COMPARISON:  Chest radiograph dated 08/09/2021. FINDINGS: Endotracheal tube with tip approximately 4.5 cm above the carina.  Enteric tube extends below the diaphragm with tip in the left upper abdomen, likely in the proximal stomach. Left IJ central venous line with tip over central SVC. Mild cardiomegaly with mild vascular congestion. Left lung base density may represent atelectasis. Developing infiltrate is not excluded. No large pleural effusion. No pneumothorax. No acute osseous pathology. Degenerative changes of spine. IMPRESSION: 1. Endotracheal tube above the carina. 2. Left IJ central venous line with tip over central SVC. No pneumothorax. Mild cardiomegaly and vascular congestion. Electronically Signed   By: Anner Crete M.D.   On: 08/09/2021 19:59   DG Chest Port 1V same Day  Result Date: 08/09/2021 CLINICAL DATA:  65 year old male with intubation EXAM: PORTABLE CHEST 1 VIEW COMPARISON:  08/09/2021, 08/07/2021 FINDINGS: Cardiomediastinal silhouette unchanged in size and contour. No evidence of central vascular congestion. No interlobular septal thickening. Interval placement of endotracheal tube, terminating suitably above the carina, 3.9 cm. Interval placement of gastric tube projecting over the mediastinum and terminating within the left upper quadrant Low lung volumes with no confluent airspace disease. Coarsened interstitial markings similar to the recent comparison chest x-rays. No pneumothorax or pleural effusion. No acute displaced fracture. Similar appearance lytic changes at the distal right clavicle IMPRESSION: Interval placement of endotracheal tube, terminating suitably above the carina. Gastric tube terminates in the stomach. Low lung volumes with likely atelectasis at the bases Electronically Signed   By: Corrie Mckusick D.O.   On: 08/09/2021 16:28   DG Abd Portable 1V  Result Date: 08/23/2021 CLINICAL DATA:  Abdominal pain EXAM: PORTABLE ABDOMEN - 1 VIEW COMPARISON:  08/21/2021 FINDINGS: Enteric tube terminates in the distal gastric antrum/pyloric region. Nonobstructive bowel gas pattern. Degenerative  changes of the right hip. IMPRESSION: Enteric tube terminates in the distal gastric antrum/pyloric region. Electronically Signed   By: Julian Hy M.D.   On: 08/23/2021 05:36   DG Abd Portable 1V  Result Date: 08/21/2021 CLINICAL DATA:  Nasogastric placement EXAM: PORTABLE ABDOMEN - 1 VIEW COMPARISON:  08/19/2021 FINDINGS: Nasogastric tube enters the stomach with its tip in the fundus. Gas pattern unremarkable. IMPRESSION: Nasogastric tube in the fundus of the stomach. Electronically Signed   By: Nelson Chimes M.D.   On: 08/21/2021 16:11   DG Abd Portable 1V  Result Date: 08/19/2021 CLINICAL DATA:  OG tube placement. EXAM: PORTABLE ABDOMEN - 1 VIEW COMPARISON:  None. FINDINGS: OG tube tip is in the mid stomach with proximal side port just above the GE junction. Bowel gas pattern is nonspecific. IMPRESSION: OG tube tip is in the mid stomach with proximal side port just above the GE junction. Tube could be advanced 4-5 cm to place the proximal side port below the GE junction. Electronically Signed   By: Misty Stanley  M.D.   On: 08/19/2021 06:37   DG Swallowing Func-Speech Pathology  Result Date: 08/24/2021 Table formatting from the original result was not included. Objective Swallowing Evaluation: Type of Study: MBS-Modified Barium Swallow Study  Patient Details Name: LEIAM HOPWOOD MRN: [id number] Date of Birth: Jan 20, 1956 Today's Date: 08/24/2021 Time: SLP Start Time (ACUTE ONLY): 0845 -SLP Stop Time (ACUTE ONLY): 0915 SLP Time Calculation (min) (ACUTE ONLY): 30 min Past Medical History: Past Medical History: Diagnosis Date  Anemia   Anxiety   Asthma   CAD (coronary artery disease)   DES to distal circumflex 2016  Cataract   Colon polyps   30 colon polyps found on first colonoscopy  Diastolic heart failure (HCC)   Diverticulitis   DJD (degenerative joint disease)   GERD (gastroesophageal reflux disease)   History of kidney stones   Hyperlipidemia   Hypertension   Insomnia   Obstructive sleep  apnea 12/2009  01/26/2010 AHI 83/hr  Permanent atrial fibrillation ([place])   Onset 2006 paroxysmal then progressive to persistent  PUD (peptic ulcer disease)   1980s  RLS (restless legs syndrome)   Sinusitis   Skin cancer   Type 2 diabetes mellitus ([place])  Past Surgical History: Past Surgical History: Procedure Laterality Date  BIOPSY  07/17/2018  Procedure: BIOPSY;  Surgeon: Danie Binder, MD;  [facility];  Service: Endoscopy;;  colon  BOWEL RESECTION  09/17/2018  SMALL BOWEL RESECTION: 71 CM   CARDIAC CATHETERIZATION N/A 07/21/2015  Procedure: Left Heart Cath and Coronary Angiography;  Surgeon: Peter M Martinique, MD;  Location: [place] [facility];  Service: Cardiovascular;  Laterality: N/A;  CARDIAC CATHETERIZATION N/A 07/21/2015  Procedure: Coronary Stent Intervention;  Surgeon: Peter M Martinique, MD;  Location: [place] [facility];  Service: Cardiovascular;  Laterality: N/A;  CIRCUMCISION N/A 04/05/2019  Procedure: CIRCUMCISION ADULT;  Surgeon: Irine Seal, MD;  Location: [facility];  Service: Urology;  Laterality: N/A;  COLONOSCOPY N/A 05/19/2014  Dr. Barnie Alderman diverticulosis/moderate external hemorrhoids, >20 simple adenomas. Genetic screening negative.   COLONOSCOPY WITH PROPOFOL N/A 07/17/2018  Dr. Oneida Alar: Diverticulosis, external/internal hemorrhoids, 32 colon polyps removed.  ten tubular adenomas removed with no high-grade dysplasia.  Advised to have surveillance colonoscopy in 3 years.  COLONOSCOPY WITH PROPOFOL N/A 06/07/2021  Procedure: COLONOSCOPY WITH PROPOFOL;  Surgeon: Eloise Harman, DO;  [facility];  Service: Endoscopy;  Laterality: N/A;  9:30 / ASA 3  (Pt was told that his time will be given at Pre-op)  ESOPHAGOGASTRODUODENOSCOPY (EGD) WITH PROPOFOL N/A 07/17/2018  Dr. Oneida Alar: Low-grade narrowing Schatzki ring at the GE junction status post dilation.  Gastritis.  Biopsy with mild nonspecific reactive gastropathy.  No H. pylori.  GIVENS CAPSULE STUDY N/A 06/24/2019  normal  HERNIA  REPAIR  1986  Left inguinal  INTRAVASCULAR PRESSURE WIRE/FFR STUDY Left 06/08/2017  Procedure: INTRAVASCULAR PRESSURE WIRE/FFR STUDY;  Surgeon: Nelva Bush, MD;  Location: [place] [facility];  Service: Cardiovascular;  Laterality: Left;  LAD and CFX  LAPAROTOMY N/A 09/17/2018  Procedure: EXPLORATORY LAPAROTOMY;  Surgeon: Virl Cagey, MD;  Location: [facility];  Service: General;  Laterality: N/A;  LEFT HEART CATH AND CORONARY ANGIOGRAPHY N/A 06/08/2017  Procedure: LEFT HEART CATH AND CORONARY ANGIOGRAPHY;  Surgeon: Nelva Bush, MD;  Location: [place] [facility];  Service: Cardiovascular;  Laterality: N/A;  POLYPECTOMY  07/17/2018  Procedure: POLYPECTOMY;  Surgeon: Danie Binder, MD;  [facility];  Service: Endoscopy;;  colon  POLYPECTOMY  06/07/2021  Procedure: POLYPECTOMY INTESTINAL;  Surgeon: Eloise Harman, DO;  [facility];  Service: Endoscopy;;  ROTATOR CUFF REPAIR    Right  SAVORY DILATION N/A 07/17/2018  Procedure: SAVORY DILATION;  Surgeon: Danie Binder, MD;  [facility];  Service: Endoscopy;  Laterality: N/A; HPI: 65 y/o male presented to ED 12/9 with flu/fever/dyspnea; desaturated 12/12 requiring bipap then intubation; extubated 12/22.  PMHx of active smoker, anxiety, CAD, permanent afib,  COPD, DMII, HTN, HHLD, OSA. Nov 2019 EGD with dilation of low grade Schatzi's ring.  Subjective: pt alert, pleasant  Recommendations for follow up therapy are one component of a multi-disciplinary discharge planning process, led by the attending physician.  Recommendations may be updated based on patient status, additional functional criteria and insurance authorization. Assessment / Plan / Recommendation Clinical Impressions 08/24/2021 Clinical Impression Pt demonstrates significant improvement in swallowing. Only mild pharyngeal weakness resuling in trace high vestibular penetration and mild vallecular residue without pt awareness. He was primarily in a chin tuck  position during test, but it did not appear particularly beneficial. Pt cued to take dry swallows and use a liquid wash, which was helpful. Pt recommended to consume a regular diet and thin liquids with basic precautions. SLP Visit Diagnosis Dysphagia, pharyngeal phase (R13.13) Attention and concentration deficit following -- Frontal lobe and executive function deficit following -- Impact on safety and function Mild aspiration risk   Treatment Recommendations 08/24/2021 Treatment Recommendations Therapy as outlined in treatment plan below   Prognosis 08/20/2021 Prognosis for Safe Diet Advancement Good Barriers to Reach Goals -- Barriers/Prognosis Comment -- Diet Recommendations 08/24/2021 SLP Diet Recommendations Regular solids;Thin liquid Liquid Administration via Cup;Straw Medication Administration Whole meds with liquid Compensations Slow rate;Small sips/bites;Multiple dry swallows after each bite/sip;Hard cough after swallow Postural Changes --   Other Recommendations 08/24/2021 Recommended Consults -- Oral Care Recommendations Oral care BID Other Recommendations -- Follow Up Recommendations Acute inpatient rehab (3hours/day) Assistance recommended at discharge Intermittent Supervision/Assistance Functional Status Assessment Patient has had a recent decline in their functional status and demonstrates the ability to make significant improvements in function in a reasonable and predictable amount of time. Frequency and Duration  08/24/2021 Speech Therapy Frequency (ACUTE ONLY) min 2x/week Treatment Duration 2 weeks   Oral Phase 08/24/2021 Oral Phase WFL Oral - Pudding Teaspoon -- Oral - Pudding Cup -- Oral - Honey Teaspoon -- Oral - Honey Cup -- Oral - Nectar Teaspoon -- Oral - Nectar Cup -- Oral - Nectar Straw -- Oral - Thin Teaspoon -- Oral - Thin Cup -- Oral - Thin Straw -- Oral - Puree -- Oral - Mech Soft -- Oral - Regular -- Oral - Multi-Consistency -- Oral - Pill -- Oral Phase - Comment --  Pharyngeal Phase  08/24/2021 Pharyngeal Phase Impaired Pharyngeal- Pudding Teaspoon -- Pharyngeal -- Pharyngeal- Pudding Cup -- Pharyngeal -- Pharyngeal- Honey Teaspoon NT Pharyngeal -- Pharyngeal- Honey Cup NT Pharyngeal -- Pharyngeal- Nectar Teaspoon NT Pharyngeal -- Pharyngeal- Nectar Cup Reduced tongue base retraction;Reduced epiglottic inversion;Pharyngeal residue - valleculae Pharyngeal Material does not enter airway Pharyngeal- Nectar Straw -- Pharyngeal -- Pharyngeal- Thin Teaspoon -- Pharyngeal -- Pharyngeal- Thin Cup Reduced epiglottic inversion;Reduced tongue base retraction;Penetration/Aspiration during swallow;Pharyngeal residue - valleculae Pharyngeal Material enters airway, remains ABOVE vocal cords and not ejected out;Material does not enter airway Pharyngeal- Thin Straw Reduced epiglottic inversion;Reduced tongue base retraction;Penetration/Aspiration during swallow;Pharyngeal residue - valleculae Pharyngeal Material enters airway, remains ABOVE vocal cords and not ejected out;Material does not enter airway Pharyngeal- Puree Reduced tongue base retraction;Reduced epiglottic inversion;Pharyngeal  residue - valleculae Pharyngeal Material does not enter airway Pharyngeal- Mechanical Soft -- Pharyngeal -- Pharyngeal- Regular Reduced epiglottic inversion;Reduced tongue base retraction;Pharyngeal residue - valleculae Pharyngeal -- Pharyngeal- Multi-consistency -- Pharyngeal -- Pharyngeal- Pill Pharyngeal residue - pyriform;Reduced tongue base retraction Pharyngeal -- Pharyngeal Comment --  Cervical Esophageal Phase  08/20/2021 Cervical Esophageal Phase WFL Pudding Teaspoon -- Pudding Cup -- Honey Teaspoon -- Honey Cup -- Nectar Teaspoon -- Nectar Cup -- Nectar Straw -- Thin Teaspoon -- Thin Cup -- Thin Straw -- Puree -- Mechanical Soft -- Regular -- Multi-consistency -- Pill -- Cervical Esophageal Comment -- Herbie Baltimore, MA CCC-SLP Acute Rehabilitation Services Office [phone] Lynann Beaver 08/24/2021,  10:10 AM                     ECHOCARDIOGRAM COMPLETE  Result Date: 08/10/2021    ECHOCARDIOGRAM REPORT   Patient Name:   YANCARLOS BERTHOLD Date of Exam: 08/10/2021 Medical Rec #:  [id number]          Height:       68.0 in Accession #:    [id number]         Weight:       202.8 lb Date of Birth:  07/18/1956         BSA:          2.056 m Patient Age:    71 years           BP:           106/68 mmHg Patient Gender: M                  HR:           97 bpm. Exam Location:  [facility] Procedure: 2D Echo, Cardiac Doppler and Color Doppler Indications:    Atrial Fibrillation I48.91  History:        Patient has prior history of Echocardiogram examinations, most                 recent 04/08/2019. CHF, CAD; Risk Factors:Hypertension and                 Dyslipidemia. Sleep Apnea.  Sonographer:    Alvino Chapel RCS Referring Phys: [place] Comments: Patient on mechanical ventilator at time of echo exam. IMPRESSIONS  1. Left ventricular ejection fraction, by estimation, is 60 to 65%. The left ventricle has normal function. The left ventricle has no regional wall motion abnormalities. There is moderate concentric left ventricular hypertrophy. Left ventricular diastolic parameters are indeterminate.  2. Right ventricular systolic function is normal. The right ventricular size is normal. Tricuspid regurgitation signal is inadequate for assessing PA pressure.  3. Left atrial size was mildly dilated.  4. The mitral valve is grossly normal. Trivial mitral valve regurgitation.  5. The aortic valve is tricuspid. Aortic valve regurgitation is not visualized.  6. Unable to estimate CVP. Comparison(s): Prior images reviewed side by side. LVEF stable at 60-65%. FINDINGS  Left Ventricle: Left ventricular ejection fraction, by estimation, is 60 to 65%. The left ventricle has normal function. The left ventricle has no regional wall motion abnormalities. The left ventricular internal cavity size was normal in size.  There is  moderate concentric left ventricular hypertrophy. Left ventricular diastolic function could not be evaluated due to atrial fibrillation. Left ventricular diastolic parameters are indeterminate. Right Ventricle: The right ventricular size is normal. No increase in right ventricular wall thickness. Right ventricular systolic function  is normal. Tricuspid regurgitation signal is inadequate for assessing PA pressure. Left Atrium: Left atrial size was mildly dilated. Right Atrium: Right atrial size was normal in size. Pericardium: There is no evidence of pericardial effusion. Presence of epicardial fat layer. Mitral Valve: The mitral valve is grossly normal. Trivial mitral valve regurgitation. Tricuspid Valve: The tricuspid valve is grossly normal. Tricuspid valve regurgitation is trivial. Aortic Valve: The aortic valve is tricuspid. There is mild to moderate aortic valve annular calcification. Aortic valve regurgitation is not visualized. Pulmonic Valve: The pulmonic valve was not well visualized. Pulmonic valve regurgitation is trivial. Aorta: The aortic root is normal in size and structure. Venous: Unable to estimate CVP. IVC assessment for right atrial pressure unable to be performed due to mechanical ventilation. IAS/Shunts: No atrial level shunt detected by color flow Doppler.  LEFT VENTRICLE PLAX 2D LVIDd:         4.60 cm LVIDs:         2.90 cm LV PW:         1.30 cm LV IVS:        1.40 cm LVOT diam:     1.90 cm LV SV:         46 LV SV Index:   22 LVOT Area:     2.84 cm  RIGHT VENTRICLE RV S prime:     10.70 cm/s TAPSE (M-mode): 1.8 cm LEFT ATRIUM             Index        RIGHT ATRIUM           Index LA diam:        4.10 cm 1.99 cm/m   RA Area:     18.50 cm LA Vol (A2C):   84.4 ml 41.05 ml/m  RA Volume:   52.00 ml  25.29 ml/m LA Vol (A4C):   75.7 ml 36.82 ml/m LA Biplane Vol: 79.8 ml 38.81 ml/m  AORTIC VALVE LVOT Vmax:   91.70 cm/s LVOT Vmean:  58.300 cm/s LVOT VTI:    0.161 m  AORTA Ao Root diam:  3.30 cm MITRAL VALVE MV Area (PHT): 4.15 cm    SHUNTS MV Decel Time: 183 msec    Systemic VTI:  0.16 m MV E velocity: 86.40 cm/s  Systemic Diam: 1.90 cm Rozann Lesches MD Electronically signed by Rozann Lesches MD Signature Date/Time: 08/10/2021/8:24:44 PM    Final     Microbiology: Results for orders placed or performed during the hospital encounter of 08/06/21  Resp Panel by RT-PCR (Flu A&B, Covid) Nasopharyngeal Swab     Status: Abnormal   Collection Time: 08/06/21  7:28 PM   Specimen: Nasopharyngeal Swab; Nasopharyngeal(NP) swabs in vial transport medium  Result Value Ref Range Status   SARS Coronavirus 2 by RT PCR NEGATIVE NEGATIVE Final    Comment: (NOTE) SARS-CoV-2 target nucleic acids are NOT DETECTED.  The SARS-CoV-2 RNA is generally detectable in upper respiratory specimens during the acute phase of infection. The lowest concentration of SARS-CoV-2 viral copies this assay can detect is 138 copies/mL. A negative result does not preclude SARS-Cov-2 infection and should not be used as the sole basis for treatment or other patient management decisions. A negative result may occur with  improper specimen collection/handling, submission of specimen other than nasopharyngeal swab, presence of viral mutation(s) within the areas targeted by this assay, and inadequate number of viral copies(<138 copies/mL). A negative result must be combined with clinical observations, patient history, and epidemiological information. The expected  result is Negative.  Fact Sheet for Patients:  EntrepreneurPulse.com.au  Fact Sheet for Healthcare Providers:  IncredibleEmployment.be  This test is no t yet approved or cleared by the [place] FDA and  has been authorized for detection and/or diagnosis of SARS-CoV-2 by FDA under an Emergency Use Authorization (EUA). This EUA will remain  in effect (meaning this test can be used) for the duration of the COVID-19  declaration under Section 564(b)(1) of the Act, 21 U.S.C.section 360bbb-3(b)(1), unless the authorization is terminated  or revoked sooner.       Influenza A by PCR POSITIVE (A) NEGATIVE Final   Influenza B by PCR NEGATIVE NEGATIVE Final    Comment: (NOTE) The Xpert Xpress SARS-CoV-2/FLU/RSV plus assay is intended as an aid in the diagnosis of influenza from Nasopharyngeal swab specimens and should not be used as a sole basis for treatment. Nasal washings and aspirates are unacceptable for Xpert Xpress SARS-CoV-2/FLU/RSV testing.  Fact Sheet for Patients: EntrepreneurPulse.com.au  Fact Sheet for Healthcare Providers: IncredibleEmployment.be  This test is not yet approved or cleared by the [place] FDA and has been authorized for detection and/or diagnosis of SARS-CoV-2 by FDA under an Emergency Use Authorization (EUA). This EUA will remain in effect (meaning this test can be used) for the duration of the COVID-19 declaration under Section 564(b)(1) of the Act, 21 U.S.C. section 360bbb-3(b)(1), unless the authorization is terminated or revoked.  Performed at [facility], [address], [place], [place] [address]   MRSA Next Gen by PCR, Nasal     Status: Abnormal   Collection Time: 08/09/21  3:03 PM   Specimen: Nasal Mucosa; Nasal Swab  Result Value Ref Range Status   MRSA by PCR Next Gen DETECTED (A) NOT DETECTED Final    Comment: RESULT CALLED TO, READ BACK BY AND VERIFIED WITH: KARA, RN @ [phone] ON 08/10/21 C VARNER (NOTE) The GeneXpert MRSA Assay (FDA approved for NASAL specimens only), is one component of a comprehensive MRSA colonization surveillance program. It is not intended to diagnose MRSA infection nor to guide or monitor treatment for MRSA infections. Test performance is not FDA approved in patients less than 69 years old. Performed at [facility], [address] [place], [place] [address]   Culture, blood (routine x 2)      Status: None   Collection Time: 08/09/21  5:22 PM   Specimen: BLOOD  Result Value Ref Range Status   Specimen Description BLOOD  Final   Special Requests NONE  Final   Culture   Final    NO GROWTH 5 DAYS Performed at [facility], [address], [place], [place] [address]    Report Status 08/14/2021 FINAL  Final  Culture, blood (routine x 2)     Status: None   Collection Time: 08/09/21  5:22 PM   Specimen: BLOOD  Result Value Ref Range Status   Specimen Description BLOOD  Final   Special Requests NONE  Final   Culture   Final    NO GROWTH 5 DAYS Performed at [facility], [address], [place],  [address]    Report Status 08/14/2021 FINAL  Final  Expectorated Sputum Assessment w Gram Stain, Rflx to Resp Cult     Status: None   Collection Time: 08/10/21 11:10 AM   Specimen: Expectorated Sputum  Result Value Ref Range Status   Specimen Description EXPECTORATED SPUTUM  Final   Special Requests NONE  Final   Sputum evaluation   Final  THIS SPECIMEN IS ACCEPTABLE FOR SPUTUM CULTURE Performed at [facility], [address], [place], [place] [address]    Report Status 08/10/2021 FINAL  Final  Culture, Respiratory w Gram Stain     Status: None   Collection Time: 08/10/21 11:10 AM  Result Value Ref Range Status   Specimen Description   Final    EXPECTORATED SPUTUM Performed at [facility], [address], [place], [place] [address]    Special Requests   Final    NONE Reflexed from [id number] Performed at [facility], [address] [place], [place] [address]    Gram Stain   Final    ABUNDANT WBC PRESENT,BOTH PMN AND MONONUCLEAR RARE GRAM POSITIVE COCCI RARE GRAM NEGATIVE RODS    Culture   Final    FEW Normal respiratory flora-no Staph aureus or Pseudomonas seen Performed at [facility], [place] [address], [place], [place] [address]    Report Status 08/12/2021 FINAL  Final    Labs: CBC: Recent Labs  Lab 08/19/21 0339 08/20/21 0338 08/23/21 0031  WBC  10.7* 12.2*   12.4* 10.4  NEUTROABS  --  10.0*  --   HGB 11.7* 12.4*   12.4* 11.6*  HCT 36.4* 39.0   39.0 36.0*  MCV 87.7 89.7   89.0 86.1  PLT 110* 120*   114* 419*   Basic Metabolic Panel: Recent Labs  Lab 08/20/21 0338 08/21/21 0053 08/22/21 1049 08/23/21 0031 08/24/21 0055  NA 147* 147* 141 142 136  K 3.5 4.1 3.2* 3.7 4.0  CL 110 111 108 107 104  CO2 34* 28 27 25 24   GLUCOSE 167* 172* 159* 133* 127*  BUN 45* 41* 27* 23 22  CREATININE 0.78 0.85 0.76 0.69 0.73  CALCIUM 8.1* 8.2* 7.8* 7.6* 7.4*  MG  --   --   --  1.8  --    Liver Function Tests: No results for input(s): AST, ALT, ALKPHOS, BILITOT, PROT, ALBUMIN in the last 168 hours. CBG: Recent Labs  Lab 08/24/21 1625 08/24/21 2104 08/25/21 0010 08/25/21 0526 08/25/21 0730  GLUCAP 147* 130* 123* 129* 175*    Discharge time spent: less than 30 minutes.  Signed:  Annita Brod MD.  [facility] 08/25/2021

## 2021-08-25 NOTE — Progress Notes (Signed)
Inpatient Rehabilitation Admission Medication Review by a Pharmacist  A complete drug regimen review was completed for this patient to identify any potential clinically significant medication issues.  High Risk Drug Classes Is patient taking? Indication by Medication  Antipsychotic Yes Abilify- MDD  Anticoagulant Yes Apixaban- A. fib  Antibiotic No   Opioid No   Antiplatelet No   Hypoglycemics/insulin Yes iSS- T2DM  Vasoactive Medication Yes Lasix, Lopressor- hypertension  Chemotherapy No   Other Yes Lipitor- HLD Lexapro- MDD Zetia- HLD Protonix- GERD Requip- RLS Trazodone- sleep     Type of Medication Issue Identified Description of Issue Recommendation(s)  Drug Interaction(s) (clinically significant)     Duplicate Therapy     Allergy     No Medication Administration End Date     Incorrect Dose     Additional Drug Therapy Needed     Significant med changes from prior encounter (inform family/care partners about these prior to discharge).    Other  PTA meds:  Metformin Lisinopril Carafate Wellbutrin Restart PTA meds when and if necessary during CIR admission or at discharge from CIR    Clinically significant medication issues were identified that warrant physician communication and completion of prescribed/recommended actions by midnight of the next day:  No   Time spent performing this drug regimen review (minutes):  30   Shanira Tine BS, PharmD, BCPS Clinical Pharmacist 08/25/2021 2:45 PM

## 2021-08-25 NOTE — H&P (Incomplete)
Physical Medicine and Rehabilitation Admission H&P    Chief Complaint  Patient presents with   Shortness of Breath  : HPI: Shaun [name] is a 65 year old right-handed male with history of anxiety, diastolic congestive heart failure, diabetes mellitus, hyperlipidemia, hypertension, obstructive sleep apnea/CPAP, PAF, COPD/tobacco use, atrial fibrillation maintained on Eliquis.,  November 2019 EGD with dilation of low-grade Schatzki's ring.  Per chart review patient lives with spouse.  1 level home one-step to entry.  Modified independent prior to admission.  Presented 08/06/2021 to [facility] with recent diagnosis of flu.  She was given steroid and Tessalon Perles per her report.  She spiked a fever to 102 with minimal relief.  She did have a cough only occasionally productive with green sputum.  No hemoptysis.  He did have some chest tightness and mild shortness of breath with exertion.  Patient had been vaccinated for COVID but not flu.  He was placed on Tamiflu in the ED temperature 99.7 heart rate 101-125 oxygen saturation 96% on 3 L no leukocytosis noted hemoglobin 15.  Chest x-ray no active disease.  Patient did require intubation for a short time.  She was transferred to [facility].  His Tamiflu has since been completed.  Hospital course complicated by her atrial fibrillation requiring IV amiodarone.  Echocardiogram with ejection fraction of 60 to 65% no wall motion abnormalities.  Initial nasogastric tube for nutritional support his diet was advanced to regular.  Therapy evaluations completed due to patient decreased functional mobility was admitted for a comprehensive rehab program.  Review of Systems  Constitutional:  Positive for fever and malaise/fatigue.  HENT:  Negative for hearing loss.   Eyes:  Negative for blurred vision and double vision.  Respiratory:  Positive for cough and shortness of breath. Negative for wheezing.   Cardiovascular:  Positive for chest  pain, palpitations and leg swelling.  Gastrointestinal:  Positive for constipation. Negative for heartburn, nausea and vomiting.       GERD  Genitourinary:  Positive for urgency. Negative for dysuria, flank pain and hematuria.  Musculoskeletal:  Positive for joint pain and myalgias.  Skin:  Negative for rash.  Neurological:  Positive for weakness.  Psychiatric/Behavioral:  Positive for depression. The patient has insomnia.        Anxiety  All other systems reviewed and are negative. Past Medical History:  Diagnosis Date   Anemia    Anxiety    Asthma    CAD (coronary artery disease)    DES to distal circumflex 2016   Cataract    Colon polyps    30 colon polyps found on first colonoscopy   Diastolic heart failure (HCC)    Diverticulitis    DJD (degenerative joint disease)    GERD (gastroesophageal reflux disease)    History of kidney stones    Hyperlipidemia    Hypertension    Insomnia    Obstructive sleep apnea 12/2009   01/26/2010 AHI 83/hr   Permanent atrial fibrillation ([place])    Onset 2006 paroxysmal then progressive to persistent   PUD (peptic ulcer disease)    1980s   RLS (restless legs syndrome)    Sinusitis    Skin cancer    Type 2 diabetes mellitus ([place])    Past Surgical History:  Procedure Laterality Date   BIOPSY  07/17/2018   Procedure: BIOPSY;  Surgeon: Danie Binder, MD;  [facility];  Service: Endoscopy;;  colon   BOWEL RESECTION  09/17/2018  SMALL BOWEL RESECTION: 71 CM    CARDIAC CATHETERIZATION N/A 07/21/2015   Procedure: Left Heart Cath and Coronary Angiography;  Surgeon: Peter M Martinique, MD;  Location: [place] [facility];  Service: Cardiovascular;  Laterality: N/A;   CARDIAC CATHETERIZATION N/A 07/21/2015   Procedure: Coronary Stent Intervention;  Surgeon: Peter M Martinique, MD;  Location: [place] [facility];  Service: Cardiovascular;  Laterality: N/A;   CIRCUMCISION N/A 04/05/2019   Procedure: CIRCUMCISION ADULT;  Surgeon: Irine Seal, MD;   Location: [facility];  Service: Urology;  Laterality: N/A;   COLONOSCOPY N/A 05/19/2014   Dr. Barnie Alderman diverticulosis/moderate external hemorrhoids, >20 simple adenomas. Genetic screening negative.    COLONOSCOPY WITH PROPOFOL N/A 07/17/2018   Dr. Oneida Alar: Diverticulosis, external/internal hemorrhoids, 32 colon polyps removed.  ten tubular adenomas removed with no high-grade dysplasia.  Advised to have surveillance colonoscopy in 3 years.   COLONOSCOPY WITH PROPOFOL N/A 06/07/2021   Procedure: COLONOSCOPY WITH PROPOFOL;  Surgeon: Eloise Harman, DO;  [facility];  Service: Endoscopy;  Laterality: N/A;  9:30 / ASA 3  (Pt was told that his time will be given at Pre-op)   ESOPHAGOGASTRODUODENOSCOPY (EGD) WITH PROPOFOL N/A 07/17/2018   Dr. Oneida Alar: Low-grade narrowing Schatzki ring at the GE junction status post dilation.  Gastritis.  Biopsy with mild nonspecific reactive gastropathy.  No H. pylori.   GIVENS CAPSULE STUDY N/A 06/24/2019   normal   HERNIA REPAIR  1986   Left inguinal   INTRAVASCULAR PRESSURE WIRE/FFR STUDY Left 06/08/2017   Procedure: INTRAVASCULAR PRESSURE WIRE/FFR STUDY;  Surgeon: Nelva Bush, MD;  Location: [place] [facility];  Service: Cardiovascular;  Laterality: Left;  LAD and CFX   LAPAROTOMY N/A 09/17/2018   Procedure: EXPLORATORY LAPAROTOMY;  Surgeon: Virl Cagey, MD;  Location: [facility];  Service: General;  Laterality: N/A;   LEFT HEART CATH AND CORONARY ANGIOGRAPHY N/A 06/08/2017   Procedure: LEFT HEART CATH AND CORONARY ANGIOGRAPHY;  Surgeon: Nelva Bush, MD;  Location: [place] [facility];  Service: Cardiovascular;  Laterality: N/A;   POLYPECTOMY  07/17/2018   Procedure: POLYPECTOMY;  Surgeon: Danie Binder, MD;  [facility];  Service: Endoscopy;;  colon   POLYPECTOMY  06/07/2021   Procedure: POLYPECTOMY INTESTINAL;  Surgeon: Eloise Harman, DO;  [facility];  Service: Endoscopy;;   ROTATOR CUFF REPAIR     Right    SAVORY DILATION N/A 07/17/2018   Procedure: SAVORY DILATION;  Surgeon: Danie Binder, MD;  [facility];  Service: Endoscopy;  Laterality: N/A;   Family History  Problem Relation Age of Onset   Hypertension Mother    Breast cancer Mother 24       brain/bone    Heart attack Father    Skin cancer Sister 21   Diabetes Brother    Parkinson's disease Brother    Brain cancer Maternal Uncle    Cancer Maternal Uncle        NOS   Breast cancer Cousin        maternal cousin dx <50   Cancer Cousin    Colon cancer Neg Hx    Social History:  reports that he has been smoking cigarettes. He started smoking about 51 years ago. He has a 19.50 pack-year smoking history. He has never used smokeless tobacco. He reports that he does not drink alcohol and does not use drugs. Allergies: No Known Allergies Medications Prior to Admission  Medication Sig Dispense Refill   albuterol (  VENTOLIN HFA) 108 (90 Base) MCG/ACT inhaler INHALE 2 PUFFS INTO THE LUNGS EVERY 6 HOURS AS NEEDED FOR WHEEZING OR SHORTNESS OF BREATH 18 g 11   apixaban (ELIQUIS) 5 MG TABS tablet TAKE 1 TABLET(5 MG) BY MOUTH TWICE DAILY 180 tablet 1   ARIPiprazole (ABILIFY) 2 MG tablet Take 1 tablet (2 mg total) by mouth daily. 90 tablet 0   atorvastatin (LIPITOR) 80 MG tablet Take 1 tablet (80 mg total) by mouth daily. TAKE 1 TABLET(80 MG) BY MOUTH EVERY EVENING 90 tablet 3   benzonatate (TESSALON PERLES) 100 MG capsule Take 1 capsule (100 mg total) by mouth 3 (three) times daily as needed. 20 capsule 0   buPROPion (WELLBUTRIN XL) 150 MG 24 hr tablet Take 3 tablets once daily (Patient taking differently: Take 150 mg by mouth 3 (three) times daily with meals.) 270 tablet 3   docusate sodium (COLACE) 100 MG capsule Take 100 mg by mouth daily as needed for mild constipation.     escitalopram (LEXAPRO) 20 MG tablet TAKE 1 TABLET(20 MG) BY MOUTH DAILY 90 tablet 0   ezetimibe (ZETIA) 10 MG tablet TAKE 1 TABLET BY MOUTH EVERY DAY 90 tablet  3   ferrous sulfate 325 (65 FE) MG tablet Take 325 mg by mouth daily with breakfast.     furosemide (LASIX) 40 MG tablet Take 1 tablet (40 mg total) by mouth daily. 90 tablet 3   lisinopril (ZESTRIL) 2.5 MG tablet TAKE 1 TABLET(2.5 MG) BY MOUTH DAILY. 90 tablet 3   meclizine (ANTIVERT) 25 MG tablet Take 1 tablet (25 mg total) by mouth 3 (three) times daily as needed for dizziness. 30 tablet 0   metFORMIN (GLUCOPHAGE) 500 MG tablet TAKE ONE TABLET (500 MG) BY MOUTH TWICE DAILY WITH A MEAL. 180 tablet 0   metoprolol tartrate (LOPRESSOR) 100 MG tablet Take 1 tablet (100 mg total) by mouth 2 (two) times daily. 180 tablet 0   nitroGLYCERIN (NITROSTAT) 0.4 MG SL tablet Place 1 tablet (0.4 mg total) under the tongue every 5 (five) minutes as needed. 25 tablet 3   omeprazole (PRILOSEC) 40 MG capsule Take 1 capsule by mouth once daily 30 minutes before breakfast 90 capsule 3   ondansetron (ZOFRAN) 4 MG tablet Take 1 tablet (4 mg total) by mouth every 8 (eight) hours as needed for nausea or vomiting. 30 tablet 1   potassium chloride SA (KLOR-CON) 20 MEQ tablet Take 1 tablet (20 mEq total) by mouth daily. 90 tablet 3   rOPINIRole (REQUIP) 3 MG tablet TAKE 1 TABLET BY MOUTH EVERY NIGHT AT BEDTIME 450 tablet 0   sildenafil (VIAGRA) 100 MG tablet TAKE 1 TABLET BY MOUTH 30 MINUTES BEFORE ACTIVITY 8 tablet 0   sucralfate (CARAFATE) 1 g tablet Take one tablet po BID PRN (Patient taking differently: Take 1 g by mouth 2 (two) times daily as needed (acid reflux).) 42 tablet 5   traMADol (ULTRAM) 50 MG tablet Take 1 tablet (50 mg total) by mouth twice daily as needed for moderate pain 60 tablet 4   traZODone (DESYREL) 50 MG tablet TAKE 2 TABLETS (100 MG) BY MOUTH AT BEDTIME 180 tablet 1    Drug Regimen Review Drug regimen was reviewed and remains appropriate with no significant issues identified.  Home: Home Living Family/patient expects to be discharged to:: Inpatient rehab Living Arrangements:  Spouse/significant other Available Help at Discharge: Family, Available 24 hours/day Type of Home: House Home Access: Stairs to enter [employer] of Steps: 1 Home Layout:  One level Bathroom Shower/Tub: Gaffer, Charity fundraiser: Standard Home Equipment: Kasandra Knudsen - single point Additional Comments: Does wood working in his shop, could be up and about walking 50 yards before he got tired.   Functional History: Prior Function Prior Level of Function : Independent/Modified Independent, Driving Mobility Comments: pt mobilizing ~50 yards at a time before he gets fatigued, no DME ADLs Comments: pt reports independent but unable to complete UE dressing due to rotator cuff issues  Functional Status:  Mobility: Bed Mobility Overal bed mobility: Needs Assistance Bed Mobility: Supine to Sit Rolling: Mod assist, +2 for physical assistance Sidelying to sit: Mod assist, +2 for physical assistance Supine to sit: Min assist, HOB elevated General bed mobility comments: pt OOB sitting in recliner chair upon PT arrival Transfers Overall transfer level: Needs assistance Equipment used:  (STEDY) Transfers: Sit to/from Stand Sit to Stand: Min assist, +2 physical assistance, +2 safety/equipment Bed to/from chair/wheelchair/BSC transfer type:: Via Lift equipment Squat pivot transfers: Total assist, +2 physical assistance Transfer via Lift Equipment: Stedy General transfer comment: pt performing sit-to-stand x1 from recliner chair using the STEDY and min A x2 to power up into a full standing position with verbal cueing to correct posture. He then performed sit<>stand x4 in the STEDY from the pads of the equipment with min guard. He was limited secondary to R hip pain with standing endurance and amount of times performing the transfer Ambulation/Gait General Gait Details: unable    ADL: ADL Overall ADL's : Needs assistance/impaired Eating/Feeding: NPO Grooming: Minimal assistance,  Bed level Upper Body Bathing: Moderate assistance Lower Body Bathing: Maximal assistance Upper Body Dressing : Total assistance, Bed level Lower Body Dressing: Total assistance Toilet Transfer: Total assistance, +2 for physical assistance, Squat-pivot Toilet Transfer Details (indicate cue type and reason): simulated bed to recliner with use of gait belt and bed pad Toileting- Clothing Manipulation and Hygiene: Total assistance Functional mobility during ADLs: Total assistance (use of Stedy; pt alb eto stand form paddles with min A)  Cognition: Cognition Overall Cognitive Status: Impaired/Different from baseline Orientation Level: Oriented X4 Cognition Arousal/Alertness: Awake/alert Behavior During Therapy: WFL for tasks assessed/performed Overall Cognitive Status: Impaired/Different from baseline Area of Impairment: Safety/judgement, Problem solving Orientation Level: Disoriented to, Time Current Attention Level: Selective Memory: Decreased short-term memory Safety/Judgement: Decreased awareness of safety, Decreased awareness of deficits Awareness: Emergent Problem Solving: Slow processing, Difficulty sequencing, Requires verbal cues General Comments: When asked initially if he had any problems with his shoulders he said no, but as we talked and worked with him he did report he had Bil rotator cuff issues  Physical Exam: Blood pressure 101/63, pulse 81, temperature 97.7 F (36.5 C), resp. rate 17, height 5\' 8"  (1.727 m), weight 93.8 kg, SpO2 97 %. Physical Exam Neurological:     Comments: Patient is alert.  No acute distress except mild anxiety.  Oriented x3.    Results for orders placed or performed during the hospital encounter of 08/06/21 (from the past 48 hour(s))  Glucose, capillary     Status: Abnormal   Collection Time: 08/23/21 12:00 PM  Result Value Ref Range   Glucose-Capillary 171 (H) 70 - 99 mg/dL    Comment: Glucose reference range applies only to samples taken  after fasting for at least 8 hours.   Comment 1 Notify RN   Brain natriuretic peptide     Status: None   Collection Time: 08/23/21  2:38 PM  Result Value Ref Range   B Natriuretic Peptide 82.2  0.0 - 100.0 pg/mL    Comment: Performed at [facility], [place] [address] [place], [place] [address]  Glucose, capillary     Status: Abnormal   Collection Time: 08/23/21  5:25 PM  Result Value Ref Range   Glucose-Capillary 110 (H) 70 - 99 mg/dL    Comment: Glucose reference range applies only to samples taken after fasting for at least 8 hours.   Comment 1 Notify RN   Glucose, capillary     Status: Abnormal   Collection Time: 08/23/21  9:21 PM  Result Value Ref Range   Glucose-Capillary 153 (H) 70 - 99 mg/dL    Comment: Glucose reference range applies only to samples taken after fasting for at least 8 hours.  Glucose, capillary     Status: Abnormal   Collection Time: 08/24/21 12:27 AM  Result Value Ref Range   Glucose-Capillary 124 (H) 70 - 99 mg/dL    Comment: Glucose reference range applies only to samples taken after fasting for at least 8 hours.  Basic metabolic panel     Status: Abnormal   Collection Time: 08/24/21 12:55 AM  Result Value Ref Range   Sodium 136 135 - 145 mmol/L   Potassium 4.0 3.5 - 5.1 mmol/L   Chloride 104 98 - 111 mmol/L   CO2 24 22 - 32 mmol/L   Glucose, Bld 127 (H) 70 - 99 mg/dL    Comment: Glucose reference range applies only to samples taken after fasting for at least 8 hours.   BUN 22 8 - 23 mg/dL   Creatinine, Ser 0.73 0.61 - 1.24 mg/dL   Calcium 7.4 (L) 8.9 - 10.3 mg/dL   GFR, Estimated >60 >60 mL/min    Comment: (NOTE) Calculated using the CKD-EPI Creatinine Equation (2021)    Anion gap 8 5 - 15    Comment: Performed at [facility] [address] [place], [place] [address]  Glucose, capillary     Status: Abnormal   Collection Time: 08/24/21  5:00 AM  Result Value Ref Range   Glucose-Capillary 132 (H) 70 - 99 mg/dL    Comment: Glucose  reference range applies only to samples taken after fasting for at least 8 hours.  Glucose, capillary     Status: Abnormal   Collection Time: 08/24/21  8:26 AM  Result Value Ref Range   Glucose-Capillary 136 (H) 70 - 99 mg/dL    Comment: Glucose reference range applies only to samples taken after fasting for at least 8 hours.  Glucose, capillary     Status: Abnormal   Collection Time: 08/24/21 11:31 AM  Result Value Ref Range   Glucose-Capillary 136 (H) 70 - 99 mg/dL    Comment: Glucose reference range applies only to samples taken after fasting for at least 8 hours.  Glucose, capillary     Status: Abnormal   Collection Time: 08/24/21  4:25 PM  Result Value Ref Range   Glucose-Capillary 147 (H) 70 - 99 mg/dL    Comment: Glucose reference range applies only to samples taken after fasting for at least 8 hours.  Glucose, capillary     Status: Abnormal   Collection Time: 08/24/21  9:04 PM  Result Value Ref Range   Glucose-Capillary 130 (H) 70 - 99 mg/dL    Comment: Glucose reference range applies only to samples taken after fasting for at least 8 hours.  Glucose, capillary     Status: Abnormal   Collection Time: 08/25/21 12:10 AM  Result Value  Ref Range   Glucose-Capillary 123 (H) 70 - 99 mg/dL    Comment: Glucose reference range applies only to samples taken after fasting for at least 8 hours.  Glucose, capillary     Status: Abnormal   Collection Time: 08/25/21  5:26 AM  Result Value Ref Range   Glucose-Capillary 129 (H) 70 - 99 mg/dL    Comment: Glucose reference range applies only to samples taken after fasting for at least 8 hours.  Glucose, capillary     Status: Abnormal   Collection Time: 08/25/21  7:30 AM  Result Value Ref Range   Glucose-Capillary 175 (H) 70 - 99 mg/dL    Comment: Glucose reference range applies only to samples taken after fasting for at least 8 hours.   DG Swallowing Func-Speech Pathology  Result Date: 08/24/2021 Table formatting from the original result  was not included. Objective Swallowing Evaluation: Type of Study: MBS-Modified Barium Swallow Study  Patient Details Name: Shaun [name] MRN: [id number] Date of Birth: September 26, 1955 Today's Date: 08/24/2021 Time: SLP Start Time (ACUTE ONLY): 0845 -SLP Stop Time (ACUTE ONLY): 0915 SLP Time Calculation (min) (ACUTE ONLY): 30 min Past Medical History: Past Medical History: Diagnosis Date  Anemia   Anxiety   Asthma   CAD (coronary artery disease)   DES to distal circumflex 2016  Cataract   Colon polyps   30 colon polyps found on first colonoscopy  Diastolic heart failure (HCC)   Diverticulitis   DJD (degenerative joint disease)   GERD (gastroesophageal reflux disease)   History of kidney stones   Hyperlipidemia   Hypertension   Insomnia   Obstructive sleep apnea 12/2009  01/26/2010 AHI 83/hr  Permanent atrial fibrillation ([place])   Onset 2006 paroxysmal then progressive to persistent  PUD (peptic ulcer disease)   1980s  RLS (restless legs syndrome)   Sinusitis   Skin cancer   Type 2 diabetes mellitus ([place])  Past Surgical History: Past Surgical History: Procedure Laterality Date  BIOPSY  07/17/2018  Procedure: BIOPSY;  Surgeon: Danie Binder, MD;  [facility];  Service: Endoscopy;;  colon  BOWEL RESECTION  09/17/2018  SMALL BOWEL RESECTION: 71 CM   CARDIAC CATHETERIZATION N/A 07/21/2015  Procedure: Left Heart Cath and Coronary Angiography;  Surgeon: Peter M Martinique, MD;  Location: [place] [facility];  Service: Cardiovascular;  Laterality: N/A;  CARDIAC CATHETERIZATION N/A 07/21/2015  Procedure: Coronary Stent Intervention;  Surgeon: Peter M Martinique, MD;  Location: [place] [facility];  Service: Cardiovascular;  Laterality: N/A;  CIRCUMCISION N/A 04/05/2019  Procedure: CIRCUMCISION ADULT;  Surgeon: Irine Seal, MD;  [facility]: [facility];  Service: Urology;  Laterality: N/A;  COLONOSCOPY N/A 05/19/2014  Dr. Barnie Alderman diverticulosis/moderate external hemorrhoids, >20 simple adenomas. Genetic screening negative.    COLONOSCOPY WITH PROPOFOL N/A 07/17/2018  Dr. Oneida Alar: Diverticulosis, external/internal hemorrhoids, 32 colon polyps removed.  ten tubular adenomas removed with no high-grade dysplasia.  Advised to have surveillance colonoscopy in 3 years.  COLONOSCOPY WITH PROPOFOL N/A 06/07/2021  Procedure: COLONOSCOPY WITH PROPOFOL;  Surgeon: Eloise Harman, DO;  [facility];  Service: Endoscopy;  Laterality: N/A;  9:30 / ASA 3  (Pt was told that his time will be given at Pre-op)  ESOPHAGOGASTRODUODENOSCOPY (EGD) WITH PROPOFOL N/A 07/17/2018  Dr. Oneida Alar: Low-grade narrowing Schatzki ring at the GE junction status post dilation.  Gastritis.  Biopsy with mild nonspecific reactive gastropathy.  No H. pylori.  GIVENS CAPSULE STUDY N/A 06/24/2019  normal  HERNIA REPAIR  1986  Left  inguinal  INTRAVASCULAR PRESSURE WIRE/FFR STUDY Left 06/08/2017  Procedure: INTRAVASCULAR PRESSURE WIRE/FFR STUDY;  Surgeon: Nelva Bush, MD;  Location: [place] [facility];  Service: Cardiovascular;  Laterality: Left;  LAD and CFX  LAPAROTOMY N/A 09/17/2018  Procedure: EXPLORATORY LAPAROTOMY;  Surgeon: Virl Cagey, MD;  Location[facility];  Service: General;  Laterality: N/A;  LEFT HEART CATH AND CORONARY ANGIOGRAPHY N/A 06/08/2017  Procedure: LEFT HEART CATH AND CORONARY ANGIOGRAPHY;  Surgeon: Nelva Bush, MD;  Location: [place] [facility];  Service: Cardiovascular;  Laterality: N/A;  POLYPECTOMY  07/17/2018  Procedure: POLYPECTOMY;  Surgeon: Danie Binder, MD;  [facility];  Service: Endoscopy;;  colon  POLYPECTOMY  06/07/2021  Procedure: POLYPECTOMY INTESTINAL;  Surgeon: Eloise Harman, DO;  [facility];  Service: Endoscopy;;  ROTATOR CUFF REPAIR    Right  SAVORY DILATION N/A 07/17/2018  Procedure: SAVORY DILATION;  Surgeon: Danie Binder, MD;  [facility];  Service: Endoscopy;  Laterality: N/A; HPI: 65 y/o male presented to ED 12/9 with flu/fever/dyspnea; desaturated 12/12 requiring  bipap then intubation; extubated 12/22.  PMHx of active smoker, anxiety, CAD, permanent afib,  COPD, DMII, HTN, HHLD, OSA. Nov 2019 EGD with dilation of low grade Schatzi's ring.  Subjective: pt alert, pleasant  Recommendations for follow up therapy are one component of a multi-disciplinary discharge planning process, led by the attending physician.  Recommendations may be updated based on patient status, additional functional criteria and insurance authorization. Assessment / Plan / Recommendation Clinical Impressions 08/24/2021 Clinical Impression Pt demonstrates significant improvement in swallowing. Only mild pharyngeal weakness resuling in trace high vestibular penetration and mild vallecular residue without pt awareness. He was primarily in a chin tuck position during test, but it did not appear particularly beneficial. Pt cued to take dry swallows and use a liquid wash, which was helpful. Pt recommended to consume a regular diet and thin liquids with basic precautions. SLP Visit Diagnosis Dysphagia, pharyngeal phase (R13.13) Attention and concentration deficit following -- Frontal lobe and executive function deficit following -- Impact on safety and function Mild aspiration risk   Treatment Recommendations 08/24/2021 Treatment Recommendations Therapy as outlined in treatment plan below   Prognosis 08/20/2021 Prognosis for Safe Diet Advancement Good Barriers to Reach Goals -- Barriers/Prognosis Comment -- Diet Recommendations 08/24/2021 SLP Diet Recommendations Regular solids;Thin liquid Liquid Administration via Cup;Straw Medication Administration Whole meds with liquid Compensations Slow rate;Small sips/bites;Multiple dry swallows after each bite/sip;Hard cough after swallow Postural Changes --   Other Recommendations 08/24/2021 Recommended Consults -- Oral Care Recommendations Oral care BID Other Recommendations -- Follow Up Recommendations Acute inpatient rehab (3hours/day) Assistance recommended at  discharge Intermittent Supervision/Assistance Functional Status Assessment Patient has had a recent decline in their functional status and demonstrates the ability to make significant improvements in function in a reasonable and predictable amount of time. Frequency and Duration  08/24/2021 Speech Therapy Frequency (ACUTE ONLY) min 2x/week Treatment Duration 2 weeks   Oral Phase 08/24/2021 Oral Phase WFL Oral - Pudding Teaspoon -- Oral - Pudding Cup -- Oral - Honey Teaspoon -- Oral - Honey Cup -- Oral - Nectar Teaspoon -- Oral - Nectar Cup -- Oral - Nectar Straw -- Oral - Thin Teaspoon -- Oral - Thin Cup -- Oral - Thin Straw -- Oral - Puree -- Oral - Mech Soft -- Oral - Regular -- Oral - Multi-Consistency -- Oral - Pill -- Oral Phase - Comment --  Pharyngeal Phase 08/24/2021 Pharyngeal Phase Impaired Pharyngeal- Pudding Teaspoon --  Pharyngeal -- Pharyngeal- Pudding Cup -- Pharyngeal -- Pharyngeal- Honey Teaspoon NT Pharyngeal -- Pharyngeal- Honey Cup NT Pharyngeal -- Pharyngeal- Nectar Teaspoon NT Pharyngeal -- Pharyngeal- Nectar Cup Reduced tongue base retraction;Reduced epiglottic inversion;Pharyngeal residue - valleculae Pharyngeal Material does not enter airway Pharyngeal- Nectar Straw -- Pharyngeal -- Pharyngeal- Thin Teaspoon -- Pharyngeal -- Pharyngeal- Thin Cup Reduced epiglottic inversion;Reduced tongue base retraction;Penetration/Aspiration during swallow;Pharyngeal residue - valleculae Pharyngeal Material enters airway, remains ABOVE vocal cords and not ejected out;Material does not enter airway Pharyngeal- Thin Straw Reduced epiglottic inversion;Reduced tongue base retraction;Penetration/Aspiration during swallow;Pharyngeal residue - valleculae Pharyngeal Material enters airway, remains ABOVE vocal cords and not ejected out;Material does not enter airway Pharyngeal- Puree Reduced tongue base retraction;Reduced epiglottic inversion;Pharyngeal residue - valleculae Pharyngeal Material does not enter airway  Pharyngeal- Mechanical Soft -- Pharyngeal -- Pharyngeal- Regular Reduced epiglottic inversion;Reduced tongue base retraction;Pharyngeal residue - valleculae Pharyngeal -- Pharyngeal- Multi-consistency -- Pharyngeal -- Pharyngeal- Pill Pharyngeal residue - pyriform;Reduced tongue base retraction Pharyngeal -- Pharyngeal Comment --  Cervical Esophageal Phase  08/20/2021 Cervical Esophageal Phase WFL Pudding Teaspoon -- Pudding Cup -- Honey Teaspoon -- Honey Cup -- Nectar Teaspoon -- Nectar Cup -- Nectar Straw -- Thin Teaspoon -- Thin Cup -- Thin Straw -- Puree -- Mechanical Soft -- Regular -- Multi-consistency -- Pill -- Cervical Esophageal Comment -- Herbie Baltimore, MA CCC-SLP Acute Rehabilitation Services Office [phone] Lynann Beaver 08/24/2021, 10:10 AM                         Medical Problem List and Plan: 1.  Debility functional deficits secondary to acute respiratory failure/COPD/OSA  -patient may *** shower  -ELOS/Goals: *** 2.  Antithrombotics: -DVT/anticoagulation:  Pharmaceutical: Other (comment) Eliquis  -antiplatelet therapy: N/A 3. Pain Management: Tylenol as needed 4. Mood:  Lexapro 20 mg daily, trazodone 100 mg nightly as needed  -antipsychotic agents: Abilify 2 mg daily 5. Neuropsych: This patient is capable of making decisions on his own behalf. 6. Skin/Wound Care: Routine skin checks 7. Fluids/Electrolytes/Nutrition: Routine in and outs with follow-up chemistries 8.  Diastolic congestive heart failure.  Lasix 20 mg twice daily.  Monitor for any signs of fluid overload 9.  Hyperlipidemia Lipitor/Zetia 10.  Diabetes mellitus.  Hemoglobin A1c 6.9.  SSI 11.  Atrial fibrillation.  Continue Eliquis.  Lopressor 100 mg twice daily.  Cardiac rate controlled.  Follow-up cardiology service. 12.  COPD/tobacco use/OSA.  Continue CPAP.  Provide counsel regarding cessation of nicotine products. 13.  GERD.  Protonix   Cathlyn Parsons, PA-C 08/25/2021

## 2021-08-25 NOTE — Progress Notes (Addendum)
Nutrition Follow-up  DOCUMENTATION CODES:   Obesity unspecified  INTERVENTION:   Encourage good PO intake Multivitamin w/ minerals daily Menu ordering w/ assist   NUTRITION DIAGNOSIS:   Inadequate oral intake related to inability to eat as evidenced by NPO status. - Ongoing  GOAL:   Patient will meet greater than or equal to 90% of their needs - Progressing   MONITOR:   PO intake, Labs, Weight trends  REASON FOR ASSESSMENT:   Ventilator    ASSESSMENT:   Patient is a 65 yo with history of CHF, HTN, DM2, anxiety who reports with dyspnea. Patient and spouse have the flu per nursing.  12/22- extubated, s/p BSE- NPO 12/23- s/p FEE- NPO with purees and honey thick liquids via spoon 12/24 - NGT placed; TF initiated 12/27 - NGT removed; MBS w/ SLP - diet advanced to Regular, thin liquids  Pt reports that his appetite is better and he is happy to have the NG out. Pt reports that he was eating ok at home but it was not great. Family member at bedside reports that they tried to follow a low sodium diet at home.  Per EMR, pt intake includes 100% lunch on 12/27 and 100% breakfast on 12/28.  Pt denies any nausea or vomiting.   Pt reports intentional weight loss prior to admission. Pt believes that he has lost additional weight while in the hospital from being unable to eat for so long. Pt reports that he on occasion will use a cane to ambulate at home.   Pt and family with no other questions or concerns at this time.   Planned to transfer to CIR today.   Medications reviewed and include: Colace, Lasix, SSI 0-20 units TID, Protonix Labs reviewed: 24 hr CBG 123-175  Nutrition Focused Physical Exam  Flowsheet Row Most Recent Value  Orbital Region No depletion  Upper Arm Region No depletion  Thoracic and Lumbar Region No depletion  Buccal Region No depletion  Temple Region No depletion  Clavicle Bone Region No depletion  Clavicle and Acromion Bone Region No depletion   Scapular Bone Region No depletion  Dorsal Hand Mild depletion  Patellar Region Moderate depletion  Anterior Thigh Region Moderate depletion  Posterior Calf Region Moderate depletion  Edema (RD Assessment) None  Hair Reviewed  Eyes Reviewed  Mouth Reviewed  Skin Reviewed  Nails Reviewed       Diet Order:   Diet Order             Diet - low sodium heart healthy           Diet regular Room service appropriate? Yes; Fluid consistency: Thin  Diet effective now                   EDUCATION NEEDS:   No education needs have been identified at this time  Skin:  Skin Assessment: Reviewed RN Assessment  Last BM:  08/23/2021  Height:   Ht Readings from Last 1 Encounters:  08/13/21 5\' 8"  (1.727 m)    Weight:   Wt Readings from Last 1 Encounters:  08/25/21 93.8 kg   BMI:  Body mass index is 31.44 kg/m.  Estimated Nutritional Needs:   Kcal:  1900-2100  Protein:  105-120 grams  Fluid:  > 1.9 L   Darriel Utter Louie Casa, RD, LDN Clinical Dietitian See [facility] for contact information.

## 2021-08-25 NOTE — Progress Notes (Signed)
Pt inquiring about tramadol for pain. He thought the doctor was prescribing it. He was content with tylenol and would like to speak to the doctor tomorrow morning to get it ordered. Offered to call on call but patient stated he will wait until the am. No other occnerns

## 2021-08-25 NOTE — Progress Notes (Signed)
Patient arrived on unit, oriented to unit. Reviewed medications, therapy schedule, rehab routine and plan of care. States an understanding of information reviewed. No complications noted at this time. Patient reports no pain and is AX4 Shaun [name] Shaun [name]  

## 2021-08-25 NOTE — Progress Notes (Signed)
RT NOTE:  CPAP setup @ bedside. RN ok to try putting patient on when ready. If assistance is needed, RT will be available.

## 2021-08-25 NOTE — PMR Pre-admission (Signed)
PMR Admission Coordinator Pre-Admission Assessment  Patient: Shaun [name] is an 65 y.o., male MRN: [id number] DOB: August 11, 1956 Height: 5' 8"  (172.7 cm) Weight: 93.8 kg  Insurance Information HMO:     PPO:  yes    PCP:      IPA:      80/20:       OTHER:  PRIMARY:  [facility] 93/23      Policy#: [id number]      Subscriber: Pt CM Name:       Phone#: [phone]     Fax#: [phone].  Per voicemail from [facility] [place] is approved for 7 days 07/2721-08/31/21 with updated due 1/3 Pre-Cert#: [id number]       Employer: [employer]  Benefits:  Phone #:      Name:  Irene Shipper Date: 02/27/2020 - 08/28/2021 Deductible: $300 ($300 met) OOP Max: $7,900 ($3,232.11 met) [facility] CIR: 80% coverage, 20% co-insurance SNF: 80% coverage, 20% co-insurance, limited to 120 days/cal yr. Outpatient: $20 copay after deductible Home Health: 80% coverage, 20% co-insurance DME: 80% coverage, 20% co-insurance Providers: in network  SECONDARY: Bernadene Person       Policy#: [id number]      Financial Counselor:       Phone#:   The Data Collection Information Summary for patients in Inpatient Rehabilitation Facilities with attached Privacy Act [place] Records was provided and verbally reviewed with: Patient  Emergency Contact Information Contact Information     Name Relation Home Work Bibb Spouse [place] [phone]   Takach,Kim Daughter [phone]  [phone]       Current Medical History  Patient Admitting Diagnosis: Respiratory Failure History of Present Illness: apnea/CPAP, PAF, COPD/tobacco use, atrial fibrillation maintained on Eliquis.,  November 2019 EGD with dilation of low-grade Schatzki's ring.  Per chart review patient lives with spouse.  1 level home one-step to entry.  Modified independent prior to admission.  Presented [facility]/06/[facility] to [facility] with recent diagnosis of flu.  She was given  steroid and Tessalon Perles per her report.  She spiked a fever to 102 with minimal relief.  She did have a cough only occasionally productive with green sputum.  No hemoptysis.  He did have some chest tightness and mild shortness of breath with exertion.  Patient had been vaccinated for COVID but not flu.  He was placed on Tamiflu in the ED temperature 99.7 heart rate 101-125 oxygen saturation 96% on 3 L no leukocytosis noted hemoglobin 15.  Chest x-ray no active disease.  Patient did require intubation for a short time.  She was [facility] to [facility].  His Tamiflu has since been completed.  Hospital course complicated by her atrial fibrillation requiring IV amiodarone.  Echocardiogram with ejection fraction of 60 to 65% no wall motion abnormalities.  Initial nasogastric tube for nutritional support his diet was advanced to regular.  Therapy evaluations completed due to patient decreased functional mobility was admitted for a comprehensive rehab program.    Patient's medical record from [facility]  has been reviewed by the rehabilitation admission coordinator and physician.  Past Medical History  Past Medical History:  Diagnosis Date   Anemia    Anxiety    Asthma    CAD (coronary artery disease)    DES to distal circumflex 2016   Cataract    Colon polyps    30 colon polyps found on first colonoscopy   Diastolic heart failure ([place])  Diverticulitis    DJD (degenerative joint disease)    GERD (gastroesophageal reflux disease)    History of kidney stones    Hyperlipidemia    Hypertension    Insomnia    Obstructive sleep apnea 12/2009   01/26/2010 AHI 83/hr   Permanent atrial fibrillation ([place])    Onset 2006 paroxysmal then progressive to persistent   PUD (peptic ulcer disease)    1980s   RLS (restless legs syndrome)    Sinusitis    Skin cancer    Type 2 diabetes mellitus ([place])     Has the patient had major surgery during 100 days prior to admission?  Yes  Family History   family history includes Brain cancer in his maternal uncle; Breast cancer in his cousin; Breast cancer (age of onset: 35) in his mother; Cancer in his cousin and maternal uncle; Diabetes in his brother; Heart attack in his father; Hypertension in his mother; Parkinson's disease in his brother; Skin cancer (age of onset: 87) in his sister.  Current Medications  Current Facility-Administered Medications:    0.9 %  sodium chloride infusion, , Intravenous, PRN, Annita Brod, MD, Stopped at 08/23/21 0102   acetaminophen (TYLENOL) tablet 650 mg, 650 mg, Oral, Q6H PRN, 650 mg at 08/20/21 2129 **OR** acetaminophen (TYLENOL) 160 MG/5ML solution 650 mg, 650 mg, Per Tube, Q6H PRN, 650 mg at 08/19/21 0535 **OR** acetaminophen (TYLENOL) suppository 650 mg, 650 mg, Rectal, Q6H PRN, Agarwala, Ravi, MD   apixaban (ELIQUIS) tablet 5 mg, 5 mg, Oral, BID, Agarwala, Ravi, MD, 5 mg at 08/25/21 0946   ARIPiprazole (ABILIFY) tablet 2 mg, 2 mg, Oral, Daily, Agarwala, Ravi, MD, 2 mg at 08/25/21 0946   atorvastatin (LIPITOR) tablet 80 mg, 80 mg, Oral, Daily, Agarwala, Ravi, MD, 80 mg at 08/24/21 1733   bisacodyl (DULCOLAX) suppository 10 mg, 10 mg, Rectal, Daily PRN, Lucile Shutters, Kerry Kass, MD   Chlorhexidine Gluconate Cloth 2 % PADS 6 each, 6 each, Topical, Daily, Shahmehdi, Seyed A, MD, 6 each at 08/25/21 0947   docusate sodium (COLACE) capsule 100 mg, 100 mg, Oral, Daily, Agarwala, Ravi, MD, 100 mg at 08/25/21 0946   escitalopram (LEXAPRO) tablet 20 mg, 20 mg, Oral, Daily, Agarwala, Ravi, MD, 20 mg at 08/25/21 0946   ezetimibe (ZETIA) tablet 10 mg, 10 mg, Oral, Daily, Buford Dresser, MD, 10 mg at 08/25/21 0946   food thickener (SIMPLYTHICK (HONEY/LEVEL 3/MODERATELY THICK)) 1 packet, 1 packet, Oral, PRN, Annita Brod, MD   furosemide (LASIX) injection 20 mg, 20 mg, Intravenous, BID, Gevena Barre K, MD, 20 mg at 08/25/21 0751   insulin aspart (novoLOG) injection 0-20 Units, 0-20  Units, Subcutaneous, TID WC, Agarwala, Ravi, MD, 4 Units at 08/25/21 0751   levalbuterol (XOPENEX) nebulizer solution 1.25 mg, 1.25 mg, Nebulization, BID, Gevena Barre K, MD, 1.25 mg at 08/25/21 1884   levalbuterol (XOPENEX) nebulizer solution 1.25 mg, 1.25 mg, Nebulization, Q6H PRN, Annita Brod, MD   metoprolol tartrate (LOPRESSOR) tablet 100 mg, 100 mg, Oral, BID, Agarwala, Ravi, MD, 100 mg at 08/24/21 2111   morphine 2 MG/ML injection 2 mg, 2 mg, Intravenous, Q3H PRN, Kipp Brood, MD, 2 mg at 08/20/21 0042   mupirocin ointment (BACTROBAN) 2 %, , Nasal, BID, Shahmehdi, Seyed A, MD, Given at 08/25/21 0947   ondansetron (ZOFRAN) tablet 4 mg, 4 mg, Per Tube, Q6H PRN **OR** ondansetron (ZOFRAN) injection 4 mg, 4 mg, Intravenous, Q6H PRN, Agarwala, Ravi, MD, 4 mg at 08/20/21 2301   pantoprazole (PROTONIX) EC  tablet 40 mg, 40 mg, Oral, Daily, Agarwala, Ravi, MD, 40 mg at 08/25/21 0946   phenol (CHLORASEPTIC) mouth spray 1 spray, 1 spray, Mouth/Throat, PRN, Annita Brod, MD, 1 spray at 08/23/21 0998   rOPINIRole (REQUIP) tablet 1 mg, 1 mg, Oral, QHS, Blount, Xenia T, NP, 1 mg at 08/24/21 2111   traZODone (DESYREL) tablet 100 mg, 100 mg, Oral, QHS PRN, Lovey Newcomer T, NP, 100 mg at 08/24/21 2111  Patients Current Diet:  Diet Order             Diet regular Room service appropriate? Yes; Fluid consistency: Thin  Diet effective now                   Precautions / Restrictions Precautions Precautions: Fall Precaution Comments: NG; L shoulder weakness Restrictions Weight Bearing Restrictions: No   Has the patient had 2 or more falls or a fall with injury in the past year? No  Prior Activity Level Community (5-7x/wk): Pt. active in the community PTA  Prior Functional Level Self Care: Did the patient need help bathing, dressing, using the toilet or eating? Independent  Indoor Mobility: Did the patient need assistance with walking from room to room (with or without  device)? Independent  Stairs: Did the patient need assistance with internal or external stairs (with or without device)? Independent  Functional Cognition: Did the patient need help planning regular tasks such as shopping or remembering to take medications? Independent  Patient Information Are you of Hispanic, Latino/a,or Spanish origin?: A. No, not of Hispanic, Latino/a, or Spanish origin What is your race?: A. White Do you need or want an interpreter to communicate with a doctor or health care staff?: 0. No  Patient's Response To:  Health Literacy and Transportation Is the patient able to respond to health literacy and transportation needs?: Yes Health Literacy - How often do you need to have someone help you when you read instructions, pamphlets, or other written material from your doctor or pharmacy?: Never In the past 12 months, has lack of transportation kept you from medical appointments or from getting medications?: No In the past 12 months, has lack of transportation kept you from meetings, work, or from getting things needed for daily living?: No  Home Assistive Devices / [place] Devices/Equipment: Radio producer (specify quad or straight) Home Equipment: Cane - single point  Prior Device Use: Indicate devices/aids used by the patient prior to current illness, exacerbation or injury? None of the above  Current Functional Level Cognition  Overall Cognitive Status: Impaired/Different from baseline Current Attention Level: Selective Orientation Level: Oriented X4 Safety/Judgement: Decreased awareness of safety, Decreased awareness of deficits General Comments: When asked initially if he had any problems with his shoulders he said no, but as we talked and worked with him he did report he had Bil rotator cuff issues    Extremity Assessment (includes Sensation/Coordination)  Upper Extremity Assessment: Defer to OT evaluation RUE Deficits / Details: prior rotator cuff  issues RUE Coordination: decreased gross motor (genralized weakness) LUE Deficits / Details: prior RTC issues, however pt states he was able to use his arm " normally; greater weakenss proximaly; elbow/wrist/hand within gross limits with decreased in-hand manipulation skills; FF - pt unable to lift arm off bed LUE Coordination: decreased gross motor  Lower Extremity Assessment: Defer to PT evaluation    ADLs  Overall ADL's : Needs assistance/impaired Eating/Feeding: NPO Grooming: Minimal assistance, Bed level Upper Body Bathing: Moderate assistance Lower Body Bathing: Maximal  assistance Upper Body Dressing : Total assistance, Bed level Lower Body Dressing: Total assistance Toilet Transfer: Total assistance, +2 for physical assistance, Squat-pivot Toilet Transfer Details (indicate cue type and reason): simulated bed to recliner with use of gait belt and bed pad Toileting- Clothing Manipulation and Hygiene: Total assistance Functional mobility during ADLs: Total assistance (use of Stedy; pt alb eto stand form paddles with min A)    Mobility  Overal bed mobility: Needs Assistance Bed Mobility: Supine to Sit Rolling: Mod assist, +2 for physical assistance Sidelying to sit: Mod assist, +2 for physical assistance Supine to sit: Min assist, HOB elevated General bed mobility comments: pt OOB sitting in recliner chair upon PT arrival    Transfers  Overall transfer level: Needs assistance Equipment used:  (STEDY) Transfers: Sit to/from Stand Sit to Stand: Min assist, +2 physical assistance, +2 safety/equipment Bed to/from chair/wheelchair/BSC transfer type:: Via Lift equipment Squat pivot transfers: Total assist, +2 physical assistance Transfer via Lift Equipment: Stedy General transfer comment: pt performing sit-to-stand x1 from recliner chair using the STEDY and min A x2 to power up into a full standing position with verbal cueing to correct posture. He then performed sit<>stand x4 in the  STEDY from the pads of the equipment with min guard. He was limited secondary to R hip pain with standing endurance and amount of times performing the transfer    Ambulation / Gait / Stairs / Wheelchair Mobility  Ambulation/Gait General Gait Details: unable    Posture / Balance Dynamic Sitting Balance Sitting balance - Comments: Pt not really attemptimg to hold himself up with his arms despite he does have some strength in them (espcially elbow distally), limited in shouder ROM pta Balance Overall balance assessment: Needs assistance Sitting-balance support: Feet supported Sitting balance-Leahy Scale: Poor Sitting balance - Comments: Pt not really attemptimg to hold himself up with his arms despite he does have some strength in them (espcially elbow distally), limited in shouder ROM pta Postural control: Posterior lean, Right lateral lean Standing balance support: Bilateral upper extremity supported Standing balance-Leahy Scale: Poor Standing balance comment: unable to achieve full standing with +2 A    Special needs/care consideration Skin Ecchymosis to BUEs   Previous Home Environment (from acute therapy documentation) Living Arrangements: Spouse/significant other Available Help at Discharge: Family, Available 24 hours/day Type of Home: House Home Layout: One level Home Access: Stairs to enter [employer] of Steps: 1 Bathroom Shower/Tub: Gaffer, Charity fundraiser: [place]: No Additional Comments: Does wood working in his shop, could be up and about walking 50 yards before he got tired.  Discharge Living Setting Plans for Discharge Living Setting: Patient's home Type of Home at Discharge: House Discharge Home Layout: One level Discharge Home Access: Stairs to enter Entrance Stairs-Rails: None Entrance Stairs-Number of Steps: 1 Discharge Bathroom Shower/Tub: Walk-in shower Discharge Bathroom Toilet: Standard Discharge Bathroom  Accessibility: Yes How Accessible: Accessible via walker Does the patient have any problems obtaining your medications?: No  Social/Family/Support Systems Patient Roles: Spouse Contact Information: [phone] Anticipated Caregiver: Olin Hauser (wife) Ability/Limitations of Caregiver: Can provide Min A Caregiver Availability: 24/7 Discharge Plan Discussed with Primary Caregiver: Yes Is Caregiver In Agreement with Plan?: Yes Does Caregiver/Family have Issues with Lodging/Transportation while Pt is in Rehab?: No  Goals Patient/Family Goal for Rehab: PT/OT/SLP Supervision Expected length of stay: 10-12 days Pt/Family Agrees to Admission and willing to participate: Yes Program Orientation Provided & Reviewed with Pt/Caregiver Including Roles  & Responsibilities: Yes  Decrease burden  of Care through IP rehab admission: Specialzed equipment needs, Decrease number of caregivers, and Patient/family education  Possible need for SNF placement upon discharge: not anticipated  Patient Condition: I have reviewed medical records from [facility], spoken with CM, and patient. I met with patient at the bedside for inpatient rehabilitation assessment.  Patient will benefit from ongoing PT and OT, can actively participate in 3 hours of therapy a day 5 days of the week, and can make measurable gains during the admission.  Patient will also benefit from the coordinated team approach during an Inpatient Acute Rehabilitation admission.  The patient will receive intensive therapy as well as Rehabilitation physician, nursing, social worker, and care management interventions.  Due to safety, skin/wound care, disease management, medication administration, pain management, and patient education the patient requires 24 hour a day rehabilitation nursing.  The patient is currently min A +2 with mobility and basic ADLs.  Discharge setting and therapy post discharge at home with home health is anticipated.   Patient has agreed to participate in the Acute Inpatient Rehabilitation Program and will admit today.  Preadmission Screen Completed By:  Genella Mech, 08/25/2021 10:19 AM ______________________________________________________________________   Discussed status with Dr. Ranell Patrick on 08/25/21 at 41 and received approval for admission today.  Admission Coordinator:  Genella Mech, CCC-SLP, time 1040/Date 08/25/21   Assessment/Plan: Diagnosis: Pulmonary debility Does the need for close, 24 hr/day Medical supervision in concert with the patient's rehab needs make it unreasonable for this patient to be served in a less intensive setting? Yes Co-Morbidities requiring supervision/potential complications: acute respiratory failure with hypoxemia, chronic atrial fibrillation, tobacco use, OSA, obesity BMI 31.44 Due to bladder management, bowel management, safety, skin/wound care, disease management, medication administration, pain management, and patient education, does the patient require 24 hr/day rehab nursing? Yes Does the patient require coordinated care of a physician, rehab nurse, PT, OT, and SLP to address physical and functional deficits in the context of the above medical diagnosis(es)? Yes Addressing deficits in the following areas: balance, endurance, locomotion, strength, transferring, bowel/bladder control, bathing, dressing, feeding, grooming, toileting, swallowing, and psychosocial support Can the patient actively participate in an intensive therapy program of at least 3 hrs of therapy 5 days a week? Yes The potential for patient to make measurable gains while on inpatient rehab is excellent Anticipated functional outcomes upon discharge from inpatient rehab: min assist PT, min assist OT, min assist SLP Estimated rehab length of stay to reach the above functional goals is: 2-3 weeks Anticipated discharge destination: Home 10. Overall Rehab/Functional Prognosis: good   MD  Signature: Leeroy Cha, MD

## 2021-08-25 NOTE — Progress Notes (Signed)
PMR Admission Coordinator Pre-Admission Assessment   Patient: Shaun [name] is an 65 y.o., male MRN: [id number] DOB: 1956-06-14 Height: _0  (172.7 cm) Weight: 93.8 kg   Insurance Information HMO:     PPO:  yes    PCP:      IPA:      80/20:       OTHER:  PRIMARY:  [facility] 00/51      Policy#: [id number]      Subscriber: Pt CM Name:       Phone#: [phone]     Fax#: [phone].  Per voicemail from [facility] is approved for 7 days 07/2721-08/31/21 with updated due 1/3 Pre-Cert#: [id number]       Employer: [employer]  Benefits:  Phone #:      Name:  Irene Shipper Date: 02/27/2020 - 08/28/2021 Deductible: $300 ($300 met) OOP Max: $7,900 ($3,232.11 met) [facility] CIR: 80% coverage, 20% co-insurance SNF: 80% coverage, 20% co-insurance, limited to 120 days/cal yr. Outpatient: $20 copay after deductible Home Health: 80% coverage, 20% co-insurance DME: 80% coverage, 20% co-insurance Providers: in network  SECONDARY: Bernadene Person       Policy#: [id number]      Financial Counselor:       Phone#:    The Data Collection Information Summary for patients in Inpatient Rehabilitation Facilities with attached Privacy Act [place] Records was provided and verbally reviewed with: Patient   Emergency Contact Information Contact Information       Name Relation Home [place] Spouse [place] [phone]    K[place],Kim Daughter [phone]   [phone]           Current Medical History  Patient Admitting Diagnosis: Respiratory Failure History of Present Illness: apnea/CPAP, PAF, COPD/tobacco use, atrial fibrillation maintained on Eliquis.,  November 2019 EGD with dilation of low-grade Schatzki's ring.  Per chart review patient lives with spouse.  1 level home one-step to entry.  Modified independent prior to admission.  Presented 08/06/2021 to [facility] with recent diagnosis of flu.   She was given steroid and Tessalon Perles per her report.  She spiked a fever to 102 with minimal relief.  She did have a cough only occasionally productive with green sputum.  No hemoptysis.  He did have some chest tightness and mild shortness of breath with exertion.  Patient had been vaccinated for COVID but not flu.  He was placed on Tamiflu in the ED temperature 99.7 heart rate 101-125 oxygen saturation 96% on 3 L no leukocytosis noted hemoglobin 15.  Chest x-ray no active disease.  Patient did require intubation for a short time.  She was transferred to [facility].  His Tamiflu has since been completed.  Hospital course complicated by her atrial fibrillation requiring IV amiodarone.  Echocardiogram with ejection fraction of 60 to 65% no wall motion abnormalities.  Initial nasogastric tube for nutritional support his diet was advanced to regular.  Therapy evaluations completed due to patient decreased functional mobility was admitted for a comprehensive rehab program.   Patient's medical record from [facility]  has been reviewed by the rehabilitation admission coordinator and physician.   Past Medical History      Past Medical History:  Diagnosis Date   Anemia     Anxiety     Asthma     CAD (coronary artery disease)      DES to distal circumflex 2016   Cataract  Colon polyps      30 colon polyps found on first colonoscopy   Diastolic heart failure (HCC)     Diverticulitis     DJD (degenerative joint disease)     GERD (gastroesophageal reflux disease)     History of kidney stones     Hyperlipidemia     Hypertension     Insomnia     Obstructive sleep apnea 12/2009    01/26/2010 AHI 83/hr   Permanent atrial fibrillation ([place])      Onset 2006 paroxysmal then progressive to persistent   PUD (peptic ulcer disease)      1980s   RLS (restless legs syndrome)     Sinusitis     Skin cancer     Type 2 diabetes mellitus ([place])        Has the patient had major  surgery during 100 days prior to admission? Yes   Family History   family history includes Brain cancer in his maternal uncle; Breast cancer in his cousin; Breast cancer (age of onset: 41) in his mother; Cancer in his cousin and maternal uncle; Diabetes in his brother; Heart attack in his father; Hypertension in his mother; Parkinson's disease in his brother; Skin cancer (age of onset: 57) in his sister.   Current Medications   Current Facility-Administered Medications:    0.9 %  sodium chloride infusion, , Intravenous, PRN, Annita Brod, MD, Stopped at 08/23/21 0102   acetaminophen (TYLENOL) tablet 650 mg, 650 mg, Oral, Q6H PRN, 650 mg at 08/20/21 2129 **OR** acetaminophen (TYLENOL) 160 MG/5ML solution 650 mg, 650 mg, Per Tube, Q6H PRN, 650 mg at 08/19/21 0535 **OR** acetaminophen (TYLENOL) suppository 650 mg, 650 mg, Rectal, Q6H PRN, Agarwala, Ravi, MD   apixaban (ELIQUIS) tablet 5 mg, 5 mg, Oral, BID, Agarwala, Ravi, MD, 5 mg at 08/25/21 0946   ARIPiprazole (ABILIFY) tablet 2 mg, 2 mg, Oral, Daily, Agarwala, Ravi, MD, 2 mg at 08/25/21 0946   atorvastatin (LIPITOR) tablet 80 mg, 80 mg, Oral, Daily, Agarwala, Ravi, MD, 80 mg at 08/24/21 1733   bisacodyl (DULCOLAX) suppository 10 mg, 10 mg, Rectal, Daily PRN, Lucile Shutters, Kerry Kass, MD   Chlorhexidine Gluconate Cloth 2 % PADS 6 each, 6 each, Topical, Daily, Shahmehdi, Seyed A, MD, 6 each at 08/25/21 0947   docusate sodium (COLACE) capsule 100 mg, 100 mg, Oral, Daily, Agarwala, Ravi, MD, 100 mg at 08/25/21 0946   escitalopram (LEXAPRO) tablet 20 mg, 20 mg, Oral, Daily, Agarwala, Ravi, MD, 20 mg at 08/25/21 0946   ezetimibe (ZETIA) tablet 10 mg, 10 mg, Oral, Daily, Buford Dresser, MD, 10 mg at 08/25/21 0946   food thickener (SIMPLYTHICK (HONEY/LEVEL 3/MODERATELY THICK)) 1 packet, 1 packet, Oral, PRN, Annita Brod, MD   furosemide (LASIX) injection 20 mg, 20 mg, Intravenous, BID, Gevena Barre K, MD, 20 mg at 08/25/21 0751    insulin aspart (novoLOG) injection 0-20 Units, 0-20 Units, Subcutaneous, TID WC, Agarwala, Ravi, MD, 4 Units at 08/25/21 0751   levalbuterol (XOPENEX) nebulizer solution 1.25 mg, 1.25 mg, Nebulization, BID, Gevena Barre K, MD, 1.25 mg at 08/25/21 5465   levalbuterol (XOPENEX) nebulizer solution 1.25 mg, 1.25 mg, Nebulization, Q6H PRN, Annita Brod, MD   metoprolol tartrate (LOPRESSOR) tablet 100 mg, 100 mg, Oral, BID, Agarwala, Ravi, MD, 100 mg at 08/24/21 2111   morphine 2 MG/ML injection 2 mg, 2 mg, Intravenous, Q3H PRN, Kipp Brood, MD, 2 mg at 08/20/21 0042   mupirocin ointment (BACTROBAN) 2 %, , Nasal, BID,  Deatra James, MD, Given at 08/25/21 0947   ondansetron [facility]) tablet 4 mg, 4 mg, Per Tube, Q6H PRN **OR** ondansetron (ZOFRAN) injection 4 mg, 4 mg, Intravenous, Q6H PRN, Agarwala, Ravi, MD, 4 mg at 08/20/21 2301   pantoprazole (PROTONIX) EC tablet 40 mg, 40 mg, Oral, Daily, Agarwala, Ravi, MD, 40 mg at 08/25/21 0946   phenol (CHLORASEPTIC) mouth spray 1 spray, 1 spray, Mouth/Throat, PRN, Annita Brod, MD, 1 spray at 08/23/21 4270   rOPINIRole (REQUIP) tablet 1 mg, 1 mg, Oral, QHS, Blount, Xenia T, NP, 1 mg at 08/24/21 2111   traZODone (DESYREL) tablet 100 mg, 100 mg, Oral, QHS PRN, Lovey Newcomer T, NP, 100 mg at 08/24/21 2111   Patients Current Diet:  Diet Order                  Diet regular Room service appropriate? Yes; Fluid consistency: Thin  Diet effective now                         Precautions / Restrictions Precautions Precautions: Fall Precaution Comments: NG; L shoulder weakness Restrictions Weight Bearing Restrictions: No    Has the patient had 2 or more falls or a fall with injury in the past year? No   Prior Activity Level Community (5-7x/wk): Pt. active in the community PTA   Prior Functional Level Self Care: Did the patient need help bathing, dressing, using the toilet or eating? Independent   Indoor Mobility: Did the patient  need assistance with walking from room to room (with or without device)? Independent   Stairs: Did the patient need assistance with internal or external stairs (with or without device)? Independent   Functional Cognition: Did the patient need help planning regular tasks such as shopping or remembering to take medications? Independent   Patient Information Are you of Hispanic, Latino/a,or Spanish origin?: A. No, not of Hispanic, Latino/a, or Spanish origin What is your race?: A. White Do you need or want an interpreter to communicate with a doctor or health care staff?: 0. No   Patient's Response To:  Health Literacy and Transportation Is the patient able to respond to health literacy and transportation needs?: Yes Health Literacy - How often do you need to have someone help you when you read instructions, pamphlets, or other written material from your doctor or pharmacy?: Never In the past 12 months, has lack of transportation kept you from medical appointments or from getting medications?: No In the past 12 months, has lack of transportation kept you from meetings, work, or from getting things needed for daily living?: No   Home Assistive Devices / [place] Devices/Equipment: Radio producer (specify quad or straight) Home Equipment: Cane - single point   Prior Device Use: Indicate devices/aids used by the patient prior to current illness, exacerbation or injury? None of the above   Current Functional Level Cognition   Overall Cognitive Status: Impaired/Different from baseline Current Attention Level: Selective Orientation Level: Oriented X4 Safety/Judgement: Decreased awareness of safety, Decreased awareness of deficits General Comments: When asked initially if he had any problems with his shoulders he said no, but as we talked and worked with him he did report he had Bil rotator cuff issues    Extremity Assessment (includes Sensation/Coordination)   Upper Extremity  Assessment: Defer to OT evaluation RUE Deficits / Details: prior rotator cuff issues RUE Coordination: decreased gross motor (genralized weakness) LUE Deficits / Details: prior RTC issues, however pt  states he was able to use his arm " normally; greater weakenss proximaly; elbow/wrist/hand within gross limits with decreased in-hand manipulation skills; FF - pt unable to lift arm off bed LUE Coordination: decreased gross motor  Lower Extremity Assessment: Defer to PT evaluation     ADLs   Overall ADL's : Needs assistance/impaired Eating/Feeding: NPO Grooming: Minimal assistance, Bed level Upper Body Bathing: Moderate assistance Lower Body Bathing: Maximal assistance Upper Body Dressing : Total assistance, Bed level Lower Body Dressing: Total assistance Toilet Transfer: Total assistance, +2 for physical assistance, Squat-pivot Toilet Transfer Details (indicate cue type and reason): simulated bed to recliner with use of gait belt and bed pad Toileting- Clothing Manipulation and Hygiene: Total assistance Functional mobility during ADLs: Total assistance (use of Stedy; pt alb eto stand form paddles with min A)     Mobility   Overal bed mobility: Needs Assistance Bed Mobility: Supine to Sit Rolling: Mod assist, +2 for physical assistance Sidelying to sit: Mod assist, +2 for physical assistance Supine to sit: Min assist, HOB elevated General bed mobility comments: pt OOB sitting in recliner chair upon PT arrival     Transfers   Overall transfer level: Needs assistance Equipment used:  (STEDY) Transfers: Sit to/from Stand Sit to Stand: Min assist, +2 physical assistance, +2 safety/equipment Bed to/from chair/wheelchair/BSC transfer type:: Via Lift equipment Squat pivot transfers: Total assist, +2 physical assistance Transfer via Lift Equipment: Stedy General transfer comment: pt performing sit-to-stand x1 from recliner chair using the STEDY and min A x2 to power up into a full standing  position with verbal cueing to correct posture. He then performed sit<>stand x4 in the STEDY from the pads of the equipment with min guard. He was limited secondary to R hip pain with standing endurance and amount of times performing the transfer     Ambulation / Gait / Stairs / Wheelchair Mobility   Ambulation/Gait General Gait Details: unable     Posture / Balance Dynamic Sitting Balance Sitting balance - Comments: Pt not really attemptimg to hold himself up with his arms despite he does have some strength in them (espcially elbow distally), limited in shouder ROM pta Balance Overall balance assessment: Needs assistance Sitting-balance support: Feet supported Sitting balance-Leahy Scale: Poor Sitting balance - Comments: Pt not really attemptimg to hold himself up with his arms despite he does have some strength in them (espcially elbow distally), limited in shouder ROM pta Postural control: Posterior lean, Right lateral lean Standing balance support: Bilateral upper extremity supported Standing balance-Leahy Scale: Poor Standing balance comment: unable to achieve full standing with +2 A     Special needs/care consideration Skin Ecchymosis to BUEs    Previous Home Environment (from acute therapy documentation) Living Arrangements: Spouse/significant other Available Help at Discharge: Family, Available 24 hours/day Type of Home: House Home Layout: One level Home Access: Stairs to enter [employer] of Steps: 1 Bathroom Shower/Tub: Gaffer, Charity fundraiser: [place]: No Additional Comments: Does wood working in his shop, could be up and about walking 50 yards before he got tired.   Discharge Living Setting Plans for Discharge Living Setting: Patient's home Type of Home at Discharge: House Discharge Home Layout: One level Discharge Home Access: Stairs to enter Entrance Stairs-Rails: None Entrance Stairs-Number of Steps: 1 Discharge  Bathroom Shower/Tub: Walk-in shower Discharge Bathroom Toilet: Standard Discharge Bathroom Accessibility: Yes How Accessible: Accessible via walker Does the patient have any problems obtaining your medications?: No   Social/Family/Support Systems Patient Roles:  Spouse Contact Information: [phone] Anticipated Caregiver: Olin Hauser (wife) Ability/Limitations of Caregiver: Can provide Min A Caregiver Availability: 24/7 Discharge Plan Discussed with Primary Caregiver: Yes Is Caregiver In Agreement with Plan?: Yes Does Caregiver/Family have Issues with Lodging/Transportation while Pt is in Rehab?: No   Goals Patient/Family Goal for Rehab: PT/OT/SLP Supervision Expected length of stay: 10-12 days Pt/Family Agrees to Admission and willing to participate: Yes Program Orientation Provided & Reviewed with Pt/Caregiver Including Roles  & Responsibilities: Yes   Decrease burden of Care through IP rehab admission: Specialzed equipment needs, Decrease number of caregivers, and Patient/family education   Possible need for SNF placement upon discharge: not anticipated   Patient Condition: I have reviewed medical records from [facility], spoken with CM, and patient. I met with patient at the bedside for inpatient rehabilitation assessment.  Patient will benefit from ongoing PT and OT, can actively participate in 3 hours of therapy a day 5 days of the week, and can make measurable gains during the admission.  Patient will also benefit from the coordinated team approach during an Inpatient Acute Rehabilitation admission.  The patient will receive intensive therapy as well as Rehabilitation physician, nursing, social worker, and care management interventions.  Due to safety, skin/wound care, disease management, medication administration, pain management, and patient education the patient requires 24 hour a day rehabilitation nursing.  The patient is currently min A +2 with mobility and basic  ADLs.  Discharge setting and therapy post discharge at home with home health is anticipated.  Patient has agreed to participate in the Acute Inpatient Rehabilitation Program and will admit today.   Preadmission Screen Completed By:  Genella Mech, 08/25/2021 10:19 AM ______________________________________________________________________   Discussed status with Dr. Ranell Patrick on 08/25/21 at 5 and received approval for admission today.   Admission Coordinator:  Genella Mech, CCC-SLP, time 1040/Date 08/25/21    Assessment/Plan: Diagnosis: Pulmonary debility Does the need for close, 24 hr/day Medical supervision in concert with the patient's rehab needs make it unreasonable for this patient to be served in a less intensive setting? Yes Co-Morbidities requiring supervision/potential complications: acute respiratory failure with hypoxemia, chronic atrial fibrillation, tobacco use, OSA, obesity BMI 31.44 Due to bladder management, bowel management, safety, skin/wound care, disease management, medication administration, pain management, and patient education, does the patient require 24 hr/day rehab nursing? Yes Does the patient require coordinated care of a physician, rehab nurse, PT, OT, and SLP to address physical and functional deficits in the context of the above medical diagnosis(es)? Yes Addressing deficits in the following areas: balance, endurance, locomotion, strength, transferring, bowel/bladder control, bathing, dressing, feeding, grooming, toileting, swallowing, and psychosocial support Can the patient actively participate in an intensive therapy program of at least 3 hrs of therapy 5 days a week? Yes The potential for patient to make measurable gains while on inpatient rehab is excellent Anticipated functional outcomes upon discharge from inpatient rehab: min assist PT, min assist OT, min assist SLP Estimated rehab length of stay to reach the above functional goals is: 2-3  weeks Anticipated discharge destination: Home 10. Overall Rehab/Functional Prognosis: good     MD Signature: Leeroy Cha, MD

## 2021-08-25 NOTE — Progress Notes (Addendum)
Physical Therapy Treatment Patient Details Name: Shaun [name] MRN: [id number] DOB: 1956/08/19 Today's Date: 08/25/2021   History of Present Illness 65 y/o male presented to ED 12/9 with flu/fever/dyspnea; desaturated 12/12 requiring bipap then intubation; extubated 12/22. Rash on body; NG 12/24. PMHx of active smoker, anxiety, CAD, permanent afib,  COPD, DMII, HTN, HHLD, OSA. Nov 2019 EGD with dilation of low grade Schatzi's ring.    PT Comments    Pt progressing well with mobility. He required min assist bed mobility, +2 min assist sit to stand with RW, and +2 mod assist SPT with RW. Pt has required stedy for transfers during previous sessions. He demonstrates fair sitting balance and poor standing balance. Pt in recliner with feet elevated at end of session. Pt on 2L O2 throughout session with SpO2 maintained in 90s.    Recommendations for follow up therapy are one component of a multi-disciplinary discharge planning process, led by the attending physician.  Recommendations may be updated based on patient status, additional functional criteria and insurance authorization.  Follow Up Recommendations  Acute inpatient rehab (3hours/day)     Assistance Recommended at Discharge Frequent or constant Supervision/Assistance  Equipment Recommendations  Other (comment) (defer to next venue)    Recommendations for Other Services       Precautions / Restrictions Precautions Precautions: Fall Restrictions Weight Bearing Restrictions: No     Mobility  Bed Mobility Overal bed mobility: Needs Assistance Bed Mobility: Supine to Sit     Supine to sit: Min assist;HOB elevated     General bed mobility comments: +rail, cues for sequencing    Transfers Overall transfer level: Needs assistance Equipment used: Rolling walker (2 wheels) Transfers: Sit to/from Stand;Bed to chair/wheelchair/BSC Sit to Stand: Min assist;+2 physical assistance;+2 safety/equipment     Step pivot  transfers: +2 physical assistance;+2 safety/equipment;Mod assist     General transfer comment: pivot steps with RW bed to recliner. Shuffle steps. Flexed posture. Cues for sequencing. Sit to stand with RW x 4 trials. Able to maintain static stance 20 seconds/trial.    Ambulation/Gait               General Gait Details: unable   Stairs             Wheelchair Mobility    Modified Rankin (Stroke Patients Only)       Balance Overall balance assessment: Needs assistance Sitting-balance support: Feet supported Sitting balance-Leahy Scale: Fair     Standing balance support: Bilateral upper extremity supported;During functional activity;Reliant on assistive device for balance Standing balance-Leahy Scale: Poor                              Cognition Arousal/Alertness: Awake/alert Behavior During Therapy: WFL for tasks assessed/performed Overall Cognitive Status: Impaired/Different from baseline Area of Impairment: Safety/judgement;Problem solving;Orientation;Attention;Memory                 Orientation Level: Disoriented to;Time Current Attention Level: Selective Memory: Decreased short-term memory   Safety/Judgement: Decreased awareness of safety;Decreased awareness of deficits Awareness: Emergent Problem Solving: Slow processing;Difficulty sequencing;Requires verbal cues          Exercises General Exercises - Lower Extremity Ankle Circles/Pumps: AROM;Both;10 reps    General Comments General comments (skin integrity, edema, etc.): After mobility, HR 100, SpO2 95% on 2L      Pertinent Vitals/Pain Pain Assessment: Faces Faces Pain Scale: Hurts a little bit Pain Location: R hip Pain Descriptors / Indicators:  Aching;Discomfort Pain Intervention(s): Monitored during session;Repositioned;Limited activity within patient's tolerance    Home Living                          Prior Function            PT Goals (current goals  can now be found in the care plan section) Acute Rehab PT Goals Patient Stated Goal: get stronger and not be a burden on his wife Progress towards PT goals: Progressing toward goals    Frequency    Min 3X/week      PT Plan Current plan remains appropriate    Co-evaluation              AM-PAC PT "6 Clicks" Mobility   Outcome Measure  Help needed turning from your back to your side while in a flat bed without using bedrails?: A Little Help needed moving from lying on your back to sitting on the side of a flat bed without using bedrails?: A Lot Help needed moving to and from a bed to a chair (including a wheelchair)?: Total Help needed standing up from a chair using your arms (e.g., wheelchair or bedside chair)?: A Lot Help needed to walk in hospital room?: Total Help needed climbing 3-5 steps with a railing? : Total 6 Click Score: 10    End of Session Equipment Utilized During Treatment: Gait belt;Oxygen Activity Tolerance: Patient tolerated treatment well Patient left: in chair;with call bell/phone within reach;with family/visitor present Nurse Communication: Mobility status PT Visit Diagnosis: Other abnormalities of gait and mobility (R26.89);Muscle weakness (generalized) (M62.81)     Time: 1103-1594 PT Time Calculation (min) (ACUTE ONLY): 26 min  Charges:  $Gait Training: 8-22 mins $Therapeutic Activity: 8-22 mins                     Lorrin Goodell, PT  Office # [phone] Pager [phone]    Lorriane Shire 08/25/2021, 10:46 AM

## 2021-08-25 NOTE — Progress Notes (Signed)
Inpatient Rehab Admissions Coordinator:   I have a CIR bed for this Pt.today. RN may call report to [phone].  Koula Venier, MS, CCC-SLP Rehab Admissions Coordinator  [phone] (celll) [phone] (office)  

## 2021-08-26 ENCOUNTER — Other Ambulatory Visit (HOSPITAL_BASED_OUTPATIENT_CLINIC_OR_DEPARTMENT_OTHER): Payer: Self-pay

## 2021-08-26 DIAGNOSIS — I4819 Other persistent atrial fibrillation: Secondary | ICD-10-CM | POA: Diagnosis not present

## 2021-08-26 DIAGNOSIS — E876 Hypokalemia: Secondary | ICD-10-CM

## 2021-08-26 DIAGNOSIS — R5381 Other malaise: Principal | ICD-10-CM

## 2021-08-26 DIAGNOSIS — G479 Sleep disorder, unspecified: Secondary | ICD-10-CM | POA: Diagnosis not present

## 2021-08-26 DIAGNOSIS — I5032 Chronic diastolic (congestive) heart failure: Secondary | ICD-10-CM

## 2021-08-26 LAB — COMPREHENSIVE METABOLIC PANEL
ALT: 75 U/L — ABNORMAL HIGH (ref 0–44)
AST: 39 U/L (ref 15–41)
Albumin: 1.9 g/dL — ABNORMAL LOW (ref 3.5–5.0)
Alkaline Phosphatase: 86 U/L (ref 38–126)
Anion gap: 7 (ref 5–15)
BUN: 19 mg/dL (ref 8–23)
CO2: 25 mmol/L (ref 22–32)
Calcium: 7.7 mg/dL — ABNORMAL LOW (ref 8.9–10.3)
Chloride: 107 mmol/L (ref 98–111)
Creatinine, Ser: 0.81 mg/dL (ref 0.61–1.24)
GFR, Estimated: >60 mL/min (ref >60)
Glucose, Bld: 140 mg/dL — ABNORMAL HIGH (ref 70–99)
Potassium: 3.4 mmol/L — ABNORMAL LOW (ref 3.5–5.1)
Sodium: 139 mmol/L (ref 135–145)
Total Bilirubin: 0.8 mg/dL (ref 0.3–1.2)
Total Protein: 5 g/dL — ABNORMAL LOW (ref 6.5–8.1)

## 2021-08-26 LAB — CBC WITH DIFFERENTIAL/PLATELET
Abs Immature Granulocytes: 0.02 10*3/uL (ref 0.00–0.07)
Basophils Absolute: 0 10*3/uL (ref 0.0–0.1)
Basophils Relative: 0 %
Eosinophils Absolute: 0.1 10*3/uL (ref 0.0–0.5)
Eosinophils Relative: 2 %
HCT: 36.4 % — ABNORMAL LOW (ref 39.0–52.0)
Hemoglobin: 11.9 g/dL — ABNORMAL LOW (ref 13.0–17.0)
Immature Granulocytes: 0 %
Lymphocytes Relative: 32 %
Lymphs Abs: 1.7 10*3/uL (ref 0.7–4.0)
MCH: 28.1 pg (ref 26.0–34.0)
MCHC: 32.7 g/dL (ref 30.0–36.0)
MCV: 86.1 fL (ref 80.0–100.0)
Monocytes Absolute: 0.4 10*3/uL (ref 0.1–1.0)
Monocytes Relative: 7 %
Neutro Abs: 3 10*3/uL (ref 1.7–7.7)
Neutrophils Relative %: 59 %
Platelets: 150 10*3/uL (ref 150–400)
RBC: 4.23 MIL/uL (ref 4.22–5.81)
RDW: 14.3 % (ref 11.5–15.5)
WBC: 5.2 10*3/uL (ref 4.0–10.5)
nRBC: 0 % (ref 0.0–0.2)

## 2021-08-26 LAB — GLUCOSE, CAPILLARY
Glucose-Capillary: 107 mg/dL — ABNORMAL HIGH (ref 70–99)
Glucose-Capillary: 119 mg/dL — ABNORMAL HIGH (ref 70–99)
Glucose-Capillary: 124 mg/dL — ABNORMAL HIGH (ref 70–99)
Glucose-Capillary: 124 mg/dL — ABNORMAL HIGH (ref 70–99)
Glucose-Capillary: 137 mg/dL — ABNORMAL HIGH (ref 70–99)
Glucose-Capillary: 143 mg/dL — ABNORMAL HIGH (ref 70–99)
Glucose-Capillary: 180 mg/dL — ABNORMAL HIGH (ref 70–99)

## 2021-08-26 MED ORDER — METFORMIN HCL 500 MG PO TABS
500.0000 mg | ORAL_TABLET | Freq: Every day | ORAL | Status: DC
  Administered 2021-08-26 – 2021-08-30 (×5): 500 mg via ORAL
  Filled 2021-08-26 (×5): qty 1

## 2021-08-26 MED ORDER — POTASSIUM CHLORIDE CRYS ER 20 MEQ PO TBCR
20.0000 meq | EXTENDED_RELEASE_TABLET | Freq: Every day | ORAL | Status: DC
  Administered 2021-08-26 – 2021-08-30 (×5): 20 meq via ORAL
  Filled 2021-08-26 (×5): qty 1

## 2021-08-26 MED ORDER — TRAZODONE HCL 50 MG PO TABS
100.0000 mg | ORAL_TABLET | Freq: Every day | ORAL | Status: DC
  Administered 2021-08-26 – 2021-08-29 (×4): 100 mg via ORAL
  Filled 2021-08-26 (×4): qty 2

## 2021-08-26 MED ORDER — TRAMADOL HCL 50 MG PO TABS
50.0000 mg | ORAL_TABLET | Freq: Four times a day (QID) | ORAL | Status: DC | PRN
  Administered 2021-08-26 – 2021-08-29 (×7): 50 mg via ORAL
  Filled 2021-08-26 (×7): qty 1

## 2021-08-26 MED ORDER — FUROSEMIDE 40 MG PO TABS
40.0000 mg | ORAL_TABLET | Freq: Every day | ORAL | Status: DC
  Administered 2021-08-26 – 2021-08-30 (×4): 40 mg via ORAL
  Filled 2021-08-26 (×5): qty 1

## 2021-08-26 NOTE — Progress Notes (Signed)
Patient ID: Shaun [name], male   DOB: Jul 08, 1956, 65 y.o.   MRN: [id number] Met with the patient and wife to review role, rehab routine, team conference and plan of care. Discussed secondary risk factoring including HTN, HLD (LDL 92) DM (A1C 6.9). HF (on lasix) and A-fib with COPD. Reviewed medication, and dietary modification recommendations. Reviewed increased protein and calcium food sources and smoking cessation. Continue to follow along to discharge to address educational needs to facilitate preparation for discharge. Margarito Liner

## 2021-08-26 NOTE — Progress Notes (Signed)
PROGRESS NOTE   Subjective/Complaints: Was a little restless last night. Couldn't get settled. Has chronic right hip pain which didn't help  ROS: Patient denies fever, rash, sore throat, blurred vision, nausea, vomiting, diarrhea, cough, shortness of breath or chest pain,  , headache, or mood change.    Objective:   DG Swallowing Func-Speech Pathology  Result Date: 08/24/2021 Table formatting from the original result was not included. Objective Swallowing Evaluation: Type of Study: MBS-Modified Barium Swallow Study  Patient Details Name: Shaun [name] MRN: [id number] Date of Birth: 06-08-1956 Today's Date: 08/24/2021 Time: SLP Start Time (ACUTE ONLY): 0845 -SLP Stop Time (ACUTE ONLY): 0915 SLP Time Calculation (min) (ACUTE ONLY): 30 min Past Medical History: Past Medical History: Diagnosis Date  Anemia   Anxiety   Asthma   CAD (coronary artery disease)   DES to distal circumflex 2016  Cataract   Colon polyps   30 colon polyps found on first colonoscopy  Diastolic heart failure (HCC)   Diverticulitis   DJD (degenerative joint disease)   GERD (gastroesophageal reflux disease)   History of kidney stones   Hyperlipidemia   Hypertension   Insomnia   Obstructive sleep apnea 12/2009  01/26/2010 AHI 83/hr  Permanent atrial fibrillation ([place])   Onset 2006 paroxysmal then progressive to persistent  PUD (peptic ulcer disease)   1980s  RLS (restless legs syndrome)   Sinusitis   Skin cancer   Type 2 diabetes mellitus ([place])  Past Surgical History: Past Surgical History: Procedure Laterality Date  BIOPSY  07/17/2018  Procedure: BIOPSY;  Surgeon: Danie Binder, MD;  [facility];  Service: Endoscopy;;  colon  BOWEL RESECTION  09/17/2018  SMALL BOWEL RESECTION: 71 CM   CARDIAC CATHETERIZATION N/A 07/21/2015  Procedure: Left Heart Cath and Coronary Angiography;  Surgeon: Peter M Martinique, MD;  Location: [facility];  Service: Cardiovascular;   Laterality: N/A;  CARDIAC CATHETERIZATION N/A 07/21/2015  Procedure: Coronary Stent Intervention;  Surgeon: Peter M Martinique, MD;  Location: [place] [facility];  Service: Cardiovascular;  Laterality: N/A;  CIRCUMCISION N/A 04/05/2019  Procedure: CIRCUMCISION ADULT;  Surgeon: Irine Seal, MD;  Location: [facility];  Service: Urology;  Laterality: N/A;  COLONOSCOPY N/A 05/19/2014  Dr. Barnie Alderman diverticulosis/moderate external hemorrhoids, >20 simple adenomas. Genetic screening negative.   COLONOSCOPY WITH PROPOFOL N/A 07/17/2018  Dr. Oneida Alar: Diverticulosis, external/internal hemorrhoids, 32 colon polyps removed.  ten tubular adenomas removed with no high-grade dysplasia.  Advised to have surveillance colonoscopy in 3 years.  COLONOSCOPY WITH PROPOFOL N/A 06/07/2021  Procedure: COLONOSCOPY WITH PROPOFOL;  Surgeon: Eloise Harman, DO;  [facility];  Service: Endoscopy;  Laterality: N/A;  9:30 / ASA 3  (Pt was told that his time will be given at Pre-op)  ESOPHAGOGASTRODUODENOSCOPY (EGD) WITH PROPOFOL N/A 07/17/2018  Dr. Oneida Alar: Low-grade narrowing Schatzki ring at the GE junction status post dilation.  Gastritis.  Biopsy with mild nonspecific reactive gastropathy.  No H. pylori.  GIVENS CAPSULE STUDY N/A 06/24/2019  normal  HERNIA REPAIR  1986  Left inguinal  INTRAVASCULAR PRESSURE WIRE/FFR STUDY Left 06/08/2017  Procedure: INTRAVASCULAR PRESSURE WIRE/FFR STUDY;  Surgeon: Nelva Bush, MD;  Location: [facility];  Service: Cardiovascular;  Laterality: Left;  LAD and CFX  LAPAROTOMY N/A 09/17/2018  Procedure: EXPLORATORY LAPAROTOMY;  Surgeon: Virl Cagey, MD;  Location: [facility];  Service: General;  Laterality: N/A;  LEFT HEART CATH AND CORONARY ANGIOGRAPHY N/A 06/08/2017  Procedure: LEFT HEART CATH AND CORONARY ANGIOGRAPHY;  Surgeon: Nelva Bush, MD;  Location: [place] [facility];  Service: Cardiovascular;  Laterality: N/A;  POLYPECTOMY  07/17/2018  Procedure: POLYPECTOMY;  Surgeon: Danie Binder, MD;  [facility];  Service: Endoscopy;;  colon  POLYPECTOMY  06/07/2021  Procedure: POLYPECTOMY INTESTINAL;  Surgeon: Eloise Harman, DO;  [facility];  Service: Endoscopy;;  ROTATOR CUFF REPAIR    Right  SAVORY DILATION N/A 07/17/2018  Procedure: SAVORY DILATION;  Surgeon: Danie Binder, MD;  [facility];  Service: Endoscopy;  Laterality: N/A; HPI: 64 y/o male presented to ED 12/9 with flu/fever/dyspnea; desaturated 12/12 requiring bipap then intubation; extubated 12/22.  PMHx of active smoker, anxiety, CAD, permanent afib,  COPD, DMII, HTN, HHLD, OSA. Nov 2019 EGD with dilation of low grade Schatzi's ring.  Subjective: pt alert, pleasant  Recommendations for follow up therapy are one component of a multi-disciplinary discharge planning process, led by the attending physician.  Recommendations may be updated based on patient status, additional functional criteria and insurance authorization. Assessment / Plan / Recommendation Clinical Impressions 08/24/2021 Clinical Impression Pt demonstrates significant improvement in swallowing. Only mild pharyngeal weakness resuling in trace high vestibular penetration and mild vallecular residue without pt awareness. He was primarily in a chin tuck position during test, but it did not appear particularly beneficial. Pt cued to take dry swallows and use a liquid wash, which was helpful. Pt recommended to consume a regular diet and thin liquids with basic precautions. SLP Visit Diagnosis Dysphagia, pharyngeal phase (R13.13) Attention and concentration deficit following -- Frontal lobe and executive function deficit following -- Impact on safety and function Mild aspiration risk   Treatment Recommendations 08/24/2021 Treatment Recommendations Therapy as outlined in treatment plan below   Prognosis 08/20/2021 Prognosis for Safe Diet Advancement Good Barriers to Reach Goals -- Barriers/Prognosis Comment -- Diet Recommendations  08/24/2021 SLP Diet Recommendations Regular solids;Thin liquid Liquid Administration via Cup;Straw Medication Administration Whole meds with liquid Compensations Slow rate;Small sips/bites;Multiple dry swallows after each bite/sip;Hard cough after swallow Postural Changes --   Other Recommendations 08/24/2021 Recommended Consults -- Oral Care Recommendations Oral care BID Other Recommendations -- Follow Up Recommendations Acute inpatient rehab (3hours/day) Assistance recommended at discharge Intermittent Supervision/Assistance Functional Status Assessment Patient has had a recent decline in their functional status and demonstrates the ability to make significant improvements in function in a reasonable and predictable amount of time. Frequency and Duration  08/24/2021 Speech Therapy Frequency (ACUTE ONLY) min 2x/week Treatment Duration 2 weeks   Oral Phase 08/24/2021 Oral Phase WFL Oral - Pudding Teaspoon -- Oral - Pudding Cup -- Oral - Honey Teaspoon -- Oral - Honey Cup -- Oral - Nectar Teaspoon -- Oral - Nectar Cup -- Oral - Nectar Straw -- Oral - Thin Teaspoon -- Oral - Thin Cup -- Oral - Thin Straw -- Oral - Puree -- Oral - Mech Soft -- Oral - Regular -- Oral - Multi-Consistency -- Oral - Pill -- Oral Phase - Comment --  Pharyngeal Phase 08/24/2021 Pharyngeal Phase Impaired Pharyngeal- Pudding Teaspoon -- Pharyngeal -- Pharyngeal- Pudding Cup -- Pharyngeal -- Pharyngeal- Honey Teaspoon NT Pharyngeal -- Pharyngeal- Honey Cup NT Pharyngeal -- Pharyngeal- Nectar Teaspoon  NT Pharyngeal -- Pharyngeal- Nectar Cup Reduced tongue base retraction;Reduced epiglottic inversion;Pharyngeal residue - valleculae Pharyngeal Material does not enter airway Pharyngeal- Nectar Straw -- Pharyngeal -- Pharyngeal- Thin Teaspoon -- Pharyngeal -- Pharyngeal- Thin Cup Reduced epiglottic inversion;Reduced tongue base retraction;Penetration/Aspiration during swallow;Pharyngeal residue - valleculae Pharyngeal Material enters airway, remains  ABOVE vocal cords and not ejected out;Material does not enter airway Pharyngeal- Thin Straw Reduced epiglottic inversion;Reduced tongue base retraction;Penetration/Aspiration during swallow;Pharyngeal residue - valleculae Pharyngeal Material enters airway, remains ABOVE vocal cords and not ejected out;Material does not enter airway Pharyngeal- Puree Reduced tongue base retraction;Reduced epiglottic inversion;Pharyngeal residue - valleculae Pharyngeal Material does not enter airway Pharyngeal- Mechanical Soft -- Pharyngeal -- Pharyngeal- Regular Reduced epiglottic inversion;Reduced tongue base retraction;Pharyngeal residue - valleculae Pharyngeal -- Pharyngeal- Multi-consistency -- Pharyngeal -- Pharyngeal- Pill Pharyngeal residue - pyriform;Reduced tongue base retraction Pharyngeal -- Pharyngeal Comment --  Cervical Esophageal Phase  08/20/2021 Cervical Esophageal Phase WFL Pudding Teaspoon -- Pudding Cup -- Honey Teaspoon -- Honey Cup -- Nectar Teaspoon -- Nectar Cup -- Nectar Straw -- Thin Teaspoon -- Thin Cup -- Thin Straw -- Puree -- Mechanical Soft -- Regular -- Multi-consistency -- Pill -- Cervical Esophageal Comment -- Herbie Baltimore, MA CCC-SLP Acute Rehabilitation Services Office [phone] Lynann Beaver 08/24/2021, 10:10 AM                     Recent Labs    08/26/21 0526  WBC 5.2  HGB 11.9*  HCT 36.4*  PLT 150   Recent Labs    08/24/21 0055 08/26/21 0526  NA 136 139  K 4.0 3.4*  CL 104 107  CO2 24 25  GLUCOSE 127* 140*  BUN 22 19  CREATININE 0.73 0.81  CALCIUM 7.4* 7.7*    Intake/Output Summary (Last 24 hours) at 08/26/2021 9678 Last data filed at 08/26/2021 0700 Gross per 24 hour  Intake 380 ml  Output 400 ml  Net -20 ml        Physical Exam: Vital Signs Blood pressure 104/74, pulse 69, temperature 97.6 F (36.4 C), temperature source Oral, resp. rate 18, height 5\' 7"  (1.702 m), weight 89.7 kg, SpO2 97 %.  General: Alert and oriented x 3, No apparent  distress HEENT: Head is normocephalic, atraumatic, PERRLA, EOMI, sclera anicteric, oral mucosa pink and moist, dentition intact, ext ear canals clear,  Neck: Supple without JVD or lymphadenopathy Heart: Reg rate and rhythm. No murmurs rubs or gallops Chest: CTA bilaterally without wheezes, rales, or rhonchi; no distress Abdomen: Soft, non-tender, non-distended, bowel sounds positive. Extremities: No clubbing, cyanosis, or edema. Pulses are 2+ Psych: Pt's affect is appropriate. Pt is cooperative Skin: Clean and intact without signs of breakdown Neuro:  Alert and oriented x 3. Normal insight and awareness. Mild STM deficits?. Normal language and speech. Cranial nerve exam unremarkable. UE motor 5/5. LE 4-/5 prox to 5-/5 distally. No sensory deficits.normal cerebellar exam Musculoskeletal: right hip pain with AROM/PROM.     Assessment/Plan: 1. Functional deficits which require 3+ hours per day of interdisciplinary therapy in a comprehensive inpatient rehab setting. Physiatrist is providing close team supervision and 24 hour management of active medical problems listed below. Physiatrist and rehab team continue to assess barriers to discharge/monitor patient progress toward functional and medical goals  Care Tool:  Bathing              Bathing assist       Upper Body Dressing/Undressing Upper body dressing   What is the patient wearing?: Hospital  gown only    Upper body assist      Lower Body Dressing/Undressing Lower body dressing      What is the patient wearing?: Incontinence brief     Lower body assist       Toileting Toileting    Toileting assist Assist for toileting: Moderate Assistance - Patient 50 - 74%     Transfers Chair/bed transfer  Transfers assist           Locomotion Ambulation   Ambulation assist              Walk 10 feet activity   Assist           Walk 50 feet activity   Assist           Walk 150 feet  activity   Assist           Walk 10 feet on uneven surface  activity   Assist           Wheelchair     Assist               Wheelchair 50 feet with 2 turns activity    Assist            Wheelchair 150 feet activity     Assist          Blood pressure 104/74, pulse 69, temperature 97.6 F (36.4 C), temperature source Oral, resp. rate 18, height 5\' 7"  (1.702 m), weight 89.7 kg, SpO2 97 %.  Medical Problem List and Plan: 1.  Debility functional deficits secondary to acute respiratory failure/COPD/OSA             -patient may shower             -ELOS/Goals: 14-21 days MinA  Patient is beginning CIR therapies today including PT and OT  2.  Antithrombotics: -DVT/anticoagulation:  Pharmaceutical: Other (comment) Eliquis             -antiplatelet therapy: N/A 3. Pain Management: Tylenol as needed  -add tramadol prn per previous routine 4. Depression:  Continue Lexapro 20 mg daily             -antipsychotic agents: Abilify 2 mg daily 5. Neuropsych: This patient is capable of making decisions on his own behalf. 6. Skin/Wound Care: Routine skin checks 7. Fluids/Electrolytes/Nutrition: encourage po  -K+ 3.4--> supplement  -recheck monday 8.  Diastolic congestive heart failure.  Lasix 20 mg twice daily.  Monitor for any signs of fluid overload   Filed Weights   08/25/21 1444 08/25/21 1508  Weight: 89.7 kg 89.7 kg    9.  Hyperlipidemia Lipitor/Zetia 10.  Diabetes mellitus.  Hemoglobin A1c 6.9.  SSI 11.  Atrial fibrillation.  Continue Eliquis.  Lopressor 100 mg twice daily.  Cardiac rate controlled.  Follow-up cardiology service.  -HR controlled 12.  COPD/tobacco use/OSA.  Continue CPAP.  Provide counsel regarding cessation of nicotine products. 13.  GERD.  Continue Protonix 14. Insomnia: pt has trazodone ordered prn but did not receive   -will schedule it tonight    LOS: 1 days A FACE TO FACE EVALUATION WAS PERFORMED  Meredith Staggers 08/26/2021, 9:03 AM

## 2021-08-26 NOTE — Progress Notes (Signed)
Inpatient Rehabilitation  Patient information reviewed and entered into eRehab system by Jermya Dowding Tangy Drozdowski, OTR/L.   Information including medical coding, functional ability and quality indicators will be reviewed and updated through discharge.    

## 2021-08-26 NOTE — Progress Notes (Signed)
[facility] Individual Statement of Services  Patient Name:  RUDI BUNYARD  Date:  08/26/2021  Welcome to the [facility].  Our goal is to provide you with an individualized program based on your diagnosis and situation, designed to meet your specific needs.  With this comprehensive rehabilitation program, you will be expected to participate in at least 3 hours of rehabilitation therapies Monday-Friday, with modified therapy programming on the weekends.  Your rehabilitation program will include the following services:  Physical Therapy (PT), Occupational Therapy (OT), 24 hour per day rehabilitation nursing, Therapeutic Recreaction (TR), Neuropsychology, Care Coordinator, Rehabilitation Medicine, Nutrition Services, and Pharmacy Services  Weekly team conferences will be held on Tuesday to discuss your progress.  Your Inpatient Rehabilitation Care Coordinator will talk with you frequently to get your input and to update you on team discussions.  Team conferences with you and your family in attendance may also be held.  Expected length of stay: 7-10 days  Overall anticipated outcome: mod/I-supervision level  Depending on your progress and recovery, your program may change. Your Inpatient Rehabilitation Care Coordinator will coordinate services and will keep you informed of any changes. Your Inpatient Rehabilitation Care Coordinator's name and contact numbers are listed  below.  The following services may also be recommended but are not provided by the [facility] will be made to provide these services after discharge if needed.  Arrangements include referral to agencies that provide these services.  Your insurance has been verified to be:  [facility] to [facility] 08/29/2021 Your primary doctor is:  Maximiano Coss  Pertinent information will be shared with your doctor and your insurance company.  Inpatient Rehabilitation Care Coordinator:  Ovidio Kin, [place] or ([phone]  Information discussed with and copy given to patient by: Elease Hashimoto, 08/26/2021, 11:01 AM

## 2021-08-26 NOTE — Evaluation (Signed)
Occupational Therapy Assessment and Plan  Patient Details  Name: Shaun [name] MRN: [id number] Date of Birth: 1956-03-09  OT Diagnosis: muscle weakness (generalized) Rehab Potential: Rehab Potential (ACUTE ONLY): Excellent ELOS: 7-10 days   Today's Date: 08/26/2021 OT Individual Time: 0737-1062 OT Individual Time Calculation (min): 65 min     [name] Problem: Principal Problem:   Acute respiratory failure (Shaun [name])   Past Medical History:  Past Medical History:  Diagnosis Date   Anemia    Anxiety    Asthma    CAD (coronary artery disease)    DES to distal circumflex 2016   Cataract    Colon polyps    30 colon polyps found on first colonoscopy   Diastolic heart failure (HCC)    Diverticulitis    DJD (degenerative joint disease)    GERD (gastroesophageal reflux disease)    History of kidney stones    Hyperlipidemia    Hypertension    Insomnia    Obstructive sleep apnea 12/2009   01/26/2010 AHI 83/hr   Permanent atrial fibrillation (Shaun [name])    Onset 2006 paroxysmal then progressive to persistent   PUD (peptic ulcer disease)    1980s   RLS (restless legs syndrome)    Sinusitis    Skin cancer    Type 2 diabetes mellitus ([place])    Past Surgical History:  Past Surgical History:  Procedure Laterality Date   BIOPSY  07/17/2018   Procedure: BIOPSY;  Surgeon: Shaun Binder, MD;  [facility];  Service: Endoscopy;;  colon   BOWEL RESECTION  09/17/2018   SMALL BOWEL RESECTION: 71 CM    CARDIAC CATHETERIZATION N/A 07/21/2015   Procedure: Left Heart Cath and Coronary Angiography;  Surgeon: Shaun M Martinique, MD;  Location: [place] [facility];  Service: Cardiovascular;  Laterality: N/A;   CARDIAC CATHETERIZATION N/A 07/21/2015   Procedure: Coronary Stent Intervention;  Surgeon: Shaun M Martinique, MD;  Location: [place] [facility];  Service: Cardiovascular;  Laterality: N/A;   CIRCUMCISION N/A 04/05/2019   Procedure: CIRCUMCISION ADULT;  Surgeon: Shaun Seal, MD;   Location: [facility];  Service: Urology;  Laterality: N/A;   COLONOSCOPY N/A 05/19/2014   Dr. Barnie [name] diverticulosis/moderate external hemorrhoids, >20 simple adenomas. Genetic screening negative.    COLONOSCOPY WITH PROPOFOL N/A 07/17/2018   Dr. Oneida [name]: Diverticulosis, external/internal hemorrhoids, 32 colon polyps removed.  ten tubular adenomas removed with no high-grade dysplasia.  Advised to have surveillance colonoscopy in 3 years.   COLONOSCOPY WITH PROPOFOL N/A 06/07/2021   Procedure: COLONOSCOPY WITH PROPOFOL;  Surgeon: Shaun Harman, DO;  [facility];  Service: Endoscopy;  Laterality: N/A;  9:30 / ASA 3  (Pt was told that his time will be given at Pre-op)   ESOPHAGOGASTRODUODENOSCOPY (EGD) WITH PROPOFOL N/A 07/17/2018   Dr. Oneida [name]: Low-grade narrowing Schatzki ring at the GE junction status post dilation.  Gastritis.  Biopsy with mild nonspecific reactive gastropathy.  No H. pylori.   GIVENS CAPSULE STUDY N/A 06/24/2019   normal   HERNIA REPAIR  1986   Left inguinal   INTRAVASCULAR PRESSURE WIRE/FFR STUDY Left 06/08/2017   Procedure: INTRAVASCULAR PRESSURE WIRE/FFR STUDY;  Surgeon: Shaun Bush, MD;  Location: [place] [facility];  Service: Cardiovascular;  Laterality: Left;  LAD and CFX   LAPAROTOMY N/A 09/17/2018   Procedure: EXPLORATORY LAPAROTOMY;  Surgeon: Virl Cagey, MD;  Location: [facility];  Service: General;  Laterality: N/A;   LEFT HEART CATH AND CORONARY ANGIOGRAPHY N/A 06/08/2017   Procedure:  LEFT HEART CATH AND CORONARY ANGIOGRAPHY;  Surgeon: Shaun Bush, MD;  Location: [place] [facility];  Service: Cardiovascular;  Laterality: N/A;   POLYPECTOMY  07/17/2018   Procedure: POLYPECTOMY;  Surgeon: Shaun Binder, MD;  [facility];  Service: Endoscopy;;  colon   POLYPECTOMY  06/07/2021   Procedure: POLYPECTOMY INTESTINAL;  Surgeon: Shaun Harman, DO;  [facility];  Service: Endoscopy;;   ROTATOR CUFF REPAIR     Right    SAVORY DILATION N/A 07/17/2018   Procedure: SAVORY DILATION;  Surgeon: Shaun Binder, MD;  [facility];  Service: Endoscopy;  Laterality: N/A;    Assessment & Plan Clinical Impression: [name]. [name] is a 65 year old right-handed male with history of anxiety, diastolic congestive heart failure, diabetes mellitus, hyperlipidemia, hypertension, obstructive sleep apnea/CPAP, PAF, COPD/tobacco use, atrial fibrillation maintained on Eliquis.,  November 2019 EGD with dilation of low-grade Schatzki's ring.  Per chart review patient lives with spouse.  1 level home one-step to entry.  Modified independent prior to admission.  Presented 08/06/2021 to [name] with recent diagnosis of flu.  She was given steroid and Tessalon Perles per her report.  She spiked a fever to 102 with minimal relief.  She did have a cough only occasionally productive with green sputum.  No hemoptysis.  He did have some chest tightness and mild shortness of breath with exertion.  Patient had been vaccinated for COVID but not flu.  He was placed on Tamiflu in the ED temperature 99.7 heart rate 101-125 oxygen saturation 96% on 3 L no leukocytosis noted hemoglobin 15.  Chest x-ray no active disease.  Patient did require intubation for a short time.  She was transferred to [name].  His Tamiflu has since been completed.  [name] course complicated by her atrial fibrillation requiring IV amiodarone.  Echocardiogram with ejection fraction of 60 to 65% no wall motion abnormalities.  Initial nasogastric tube for nutritional support his diet was advanced to regular.  Therapy evaluations completed due to patient decreased functional mobility was admitted for a comprehensive rehab program. His wife is at bedside and says she will be present throughout his rehab stay.  Patient transferred to Shaun [name] on 08/25/2021 .    Patient currently requires min with basic self-care skills secondary to muscle weakness, decreased  cardiorespiratoy endurance, and decreased standing balance, decreased postural control, and decreased balance strategies.  Prior to hospitalization, patient could complete self care  with mod A for dressing and mod I for toileting.  Patient will benefit from skilled intervention to increase independence with basic self-care skills prior to discharge home with care partner.  Anticipate patient will require intermittent supervision and follow up home health.  OT - End of Session Activity Tolerance: Tolerates 10 - 20 min activity with multiple rests Endurance Deficit: Yes Endurance Deficit Description: some dyspnea on exertion but vital signs WNL. Benefited from brief seated rest breaks b/w functional mobility tasks OT Assessment Rehab Potential (ACUTE ONLY): Excellent OT Patient demonstrates impairments in the following area(s): Balance;Endurance;Motor;Pain OT Basic ADL's Functional Problem(s): Grooming;Bathing;Dressing;Toileting OT Transfers Functional Problem(s): Toilet;Tub/Shower OT Additional Impairment(s): None OT Plan OT Intensity: Minimum of 1-2 x/day, 45 to 90 minutes OT Frequency: 5 out of 7 days OT Duration/Estimated Length of Stay: 7-10 days OT Treatment/Interventions: Balance/vestibular training;Discharge planning;DME/adaptive equipment instruction;Functional mobility training;Patient/family education;Pain management;Self Care/advanced ADL retraining;Psychosocial support;Therapeutic Activities;Therapeutic Exercise OT Self Feeding Anticipated Outcome(s): independent OT Basic Self-Care Anticipated Outcome(s): supervision OT Toileting Anticipated Outcome(s): mod  I OT Bathroom Transfers Anticipated Outcome(s): mod I toilet transfers, supervision with shower transfers OT Recommendation Patient destination: Home Follow Up Recommendations: Home health OT Equipment Recommended: Tub/shower seat Equipment Details: wife plans to get a shower seat on her own   OT  Evaluation Precautions/Restrictions  Precautions Precautions: Fall Restrictions Weight Bearing Restrictions: No   Vital Signs  O2 sats 97-99% on room air with ADLs (toileting, shower, dressing) Pain Pain Assessment Pain Scale: 0-10 Pain Score: 7  Pain Type: Chronic pain Pain Location: Hip Pain Orientation: Right Pain Descriptors / Indicators: Aching Pain Intervention(s): RN made aware Multiple Pain Sites: Yes 2nd Pain Site Pain Type: Chronic pain Pain Location: Shoulder Pain Orientation: Left Pain Descriptors / Indicators: Aching Pain Intervention(s): Repositioned;Emotional support;Ambulation/increased activity Home Living/Prior Functioning Home Living Family/patient expects to be discharged to:: Private residence Living Arrangements: Spouse/significant other, Other relatives Available Help at Discharge: Family, Available 24 hours/day Type of Home: House Home Access: Stairs to enter [employer] of Steps: 2 Entrance Stairs-Rails: None Home Layout: One level Bathroom Shower/Tub: Door, Multimedia programmer: Standard Additional Comments: Ambulating short community (50 yards, per patient) distances mod I with cane (sometimes wouldn't use the cane)  Lives With: Spouse, Family (x2 grand children, 58 and 76 y/o) Prior Function Level of Independence: Other (comment), Needs assistance with ADLs Dressing: Moderate (socks, shoes, and shirt)  Able to Take Stairs?: Yes Driving: Yes Vocation: Retired Biomedical scientist: Logistics Leisure: Hobbies-yes (Comment) Clydene Laming working) Vision Baseline Vision/History: 1 Wears glasses (tri-focals) Ability to See in Adequate Light: 0 Adequate Patient Visual Report: No change from baseline Vision Assessment?: No apparent visual deficits Perception  Perception: Within Functional Limits Praxis Praxis: Intact Cognition Overall Cognitive Status: Within Functional Limits for tasks assessed Arousal/Alertness:  Awake/alert Orientation Level: Person;Place;Situation Person: Oriented Place: Oriented Situation: Oriented Year: 2022 Month: December Day of Week: Correct Memory: Appears intact Immediate Memory Recall: Sock;Blue;Bed Memory Recall Sock: Without Cue Memory Recall Blue: Without Cue Memory Recall Bed: Without Cue Awareness: Appears intact Problem Solving: Appears intact Safety/Judgment: Appears intact Sensation Sensation Light Touch: Appears Intact Hot/Cold: Appears Intact Proprioception: Appears Intact Stereognosis: Appears Intact Coordination Gross Motor Movements are Fluid and Coordinated: No Coordination and Movement Description: Antalgic movement patterns related to chronic pain in R hip and L shoulder Heel Shin Test: WNL, limited some by hip flexor weakness Motor  Motor Motor: Other (comment) Motor - Skilled Clinical Observations: Generalized weakness and deconditioning  Trunk/Postural Assessment  Cervical Assessment Cervical Assessment: Exceptions to [facility] (forward head) Thoracic Assessment Thoracic Assessment: Exceptions to [name] (rounded shoulders) Lumbar Assessment Lumbar Assessment: Exceptions to [name] (posterior pelvic tilt) Postural Control Postural Control: Within Functional Limits  Balance Balance Balance Assessed: Yes Static Sitting Balance Static Sitting - Balance Support: Feet supported;No upper extremity supported Static Sitting - Level of Assistance: 5: Stand by assistance Dynamic Sitting Balance Dynamic Sitting - Balance Support: Feet unsupported;During functional activity Dynamic Sitting - Level of Assistance: 4: Min assist Static Standing Balance Static Standing - Balance Support: Bilateral upper extremity supported Static Standing - Level of Assistance: 4: Min assist Dynamic Standing Balance Dynamic Standing - Balance Support: Bilateral upper extremity supported;During functional activity Dynamic Standing - Level of Assistance: 4: Min  assist Extremity/Trunk Assessment RUE Assessment Active Range of Motion (AROM) Comments: functional for basic ADLS, has history of shoulder surgery LUE Assessment Active Range of Motion (AROM) Comments: limited due to pain, pt is planning on having shoulder replacement surgery in the future General Strength Comments: Pt able to use LUE  as an active A during ADLS and uses adaptive strategies  Care Tool Care Tool Self Care Eating   Eating Assist Level: Set up assist    Oral Care    Oral Care Assist Level: Set up assist    Bathing   Body parts bathed by patient: Chest;Left arm;Right arm;Abdomen;Front perineal area;Buttocks;Right upper leg;Left upper leg;Face;Left lower leg;Right lower leg     Assist Level: Contact Guard/Touching assist    Upper Body Dressing(including orthotics)   What is the patient wearing?: Pull over shirt   Assist Level: Contact Guard/Touching assist    Lower Body Dressing (excluding footwear)   What is the patient wearing?: Underwear/pull up;Pants Assist for lower body dressing: Moderate Assistance - Patient 50 - 74%    Putting on/Taking off footwear   What is the patient wearing?: Ted hose;Non-skid slipper socks Assist for footwear: Moderate Assistance - Patient 50 - 74%       Care Tool Toileting Toileting activity   Assist for toileting: Minimal Assistance - Patient > 75%     Care Tool Bed Mobility Roll left and right activity   Roll left and right assist level: Supervision/Verbal cueing    Sit to lying activity   Sit to lying assist level: Moderate Assistance - Patient 50 - 74%    Lying to sitting on side of bed activity   Lying to sitting on side of bed assist level: the ability to move from lying on the back to sitting on the side of the bed with no back support.: Moderate Assistance - Patient 50 - 74%     Care Tool Transfers Sit to stand transfer   Sit to stand assist level: Minimal Assistance - Patient > 75%    Chair/bed transfer    Chair/bed transfer assist level: Minimal Assistance - Patient > 75%     Toilet transfer   Assist Level: Minimal Assistance - Patient > 75%     Care Tool Cognition  Expression of Ideas and Wants Expression of Ideas and Wants: 4. Without difficulty (complex and basic) - expresses complex messages without difficulty and with speech that is clear and easy to understand  Understanding Verbal and Non-Verbal Content Understanding Verbal and Non-Verbal Content: 4. Understands (complex and basic) - clear comprehension without cues or repetitions   Memory/Recall Ability Memory/Recall Ability : Current season;Location of own room;That he or she is in a [name]/[name] unit;Staff names and faces   Refer to Care Plan for [place] 1 OT Short Term Goal 1 (Week 1): STGs = LTGs  Recommendations for other services: None    Skilled Therapeutic Intervention ADL ADL Eating: Set up Grooming: Setup Upper Body Bathing: Supervision/safety Where Assessed-Upper Body Bathing: Shower Lower Body Bathing: Contact guard Where Assessed-Lower Body Bathing: Shower Upper Body Dressing: Contact guard Where Assessed-Upper Body Dressing: Standing at sink;Sitting at sink Lower Body Dressing: Moderate assistance Where Assessed-Lower Body Dressing: Standing at sink;Sitting at sink Toileting: Minimal assistance Where Assessed-Toileting: Glass blower/designer: Psychiatric nurse Method: Counselling psychologist: Grab bars;Raised Counselling psychologist: Environmental education officer Method: Heritage manager: Transfer tub bench;Grab bars Mobility  Bed Mobility Bed Mobility: Rolling Right;Rolling Left;Supine to [employer] Right: Supervision/verbal cueing Rolling Left: Supervision/Verbal cueing Supine to Sit: Moderate Assistance - Patient 50-74% (for trunk support) Transfers Sit to Stand: Minimal Assistance -  Patient > 75% Stand to Sit: Minimal Assistance - Patient > 75%  Pt seen for initial  evaluation and ADL training. Explained role of OT, discussed his goals with his wife and pt.  Pt eager to use toilet, with RW and cues to push up with BOTH hands from wc, pt stood with CGA and then ambulated to toilet with min A.  Provided pt with elevated toilet seat.  He does need cues to not push walker too far forward with ambulation. Pt able to have a bowel movement and self cleanse in sitting.   He then transferred to shower and used long sponge for feet and bathed in sitting.  Discussed DME needs with pt and wife for home.  Transferred to wc to dress.  Had pt learn how to use a reacher and a sock aid as he has R hip pain with forward flexion and will be getting a R THA in the near future.  With AE and cues he was able to dress with light A. See ADL doc above. Reviewed AE needs and discussed possible LOS.  Donned TEDs on pt.  Pt resting in wc with wife and dtr in the room. All needs met.    Discharge Criteria: Patient will be discharged from OT if patient refuses treatment 3 consecutive times without medical reason, if treatment goals not met, if there is a change in medical status, if patient makes no progress towards goals or if patient is discharged from [name].  The above assessment, treatment plan, treatment alternatives and goals were discussed and mutually agreed upon: by patient and by family  Ryota Treece 08/26/2021, 12:23 PM

## 2021-08-26 NOTE — Progress Notes (Signed)
Physical Therapy Session Note  Patient Details  Name: Shaun [name] MRN: [id number] Date of Birth: 12-09-1955  Today's Date: 08/26/2021 PT Individual Time: 0757-3225 PT Individual Time Calculation (min): 53 min   Short Term Goals: Week 1:  PT Short Term Goal 1 (Week 1): STG = LTG due to ELOS  Skilled Therapeutic Interventions/Progress Updates: Pt presented in bed with family present agreeable to therapy. Pt states pain currently 6/10, recently premedicated and rest breaks provided as needed throughout session. Pt performed supine to sit with CGA and use of bed features. Performed Sit to stand and stand pivot transfer with CGA to w/c with RW. Pt transported to day room for energy conservation. Pt performed stand pivot transfer to high/low mat with CGA in same manner as prior. Pt participated in seated LE therex with seated breaks provided as needed. Performed LAQ with 2.5lb weights 2 x 10, adduction squeezes 2 x 10, and standing hip abd/add x 5 bilaterally. Pt becoming increasingly fatigued with activity. PTA discussed with pt an dgt significant change  between acute and CIR therapies and that pt is significantly deconditioned at this time. PTA also discussed continued strengthening if hips as would help with increased ambulation and improved recovery when pt receives THA. Pt verbalized understanding. Pt then performed ambulatory transfer to w/c with CGA and transported to [employer]. Pt participated in [place] 50cm/sec 1 min x 2 bouts for general conditioning. Pt transported back to room at end of session and performed ambulatory transfer to bed with RW and CGA. Pt required minA for BLE management to return to bed. Pt left in bed at end of session with bed alarm on, call bell within reach and needs met.    Therapy Documentation Precautions:  Precautions Precautions: Fall Restrictions Weight Bearing Restrictions: No General:   Vital Signs: Therapy Vitals Temp: 98.3 F (36.8  C) Temp Source: Oral Pulse Rate: 75 Resp: 16 BP: (!) 88/56 Patient Position (if appropriate): Sitting Oxygen Therapy SpO2: 96 % O2 Device: Room Air Pain: Pain Assessment Pain Score: 5  Exercises:   Other Treatments:      Therapy/Group: Individual Therapy  Brighid Koch 08/26/2021, 4:15 PM

## 2021-08-26 NOTE — Progress Notes (Signed)
Inpatient Rehabilitation Care Coordinator Assessment and Plan Patient Details  Name: Shaun [name] MRN: [id number] Date of Birth: [birth year]  Today's Date: 08/26/2021  Hospital Problems: Principal Problem:   Acute respiratory failure [facility])  Past Medical History:  Past Medical History:  Diagnosis Date   Anemia    Anxiety    Asthma    CAD (coronary artery disease)    DES to distal circumflex 2016   Cataract    Colon polyps    30 colon polyps found on first colonoscopy   Diastolic heart failure (HCC)    Diverticulitis    DJD (degenerative joint disease)    GERD (gastroesophageal reflux disease)    History of kidney stones    Hyperlipidemia    Hypertension    Insomnia    Obstructive sleep apnea 12/2009   01/26/2010 AHI 83/hr   Permanent atrial fibrillation ([place])    Onset 2006 paroxysmal then progressive to persistent   PUD (peptic ulcer disease)    1980s   RLS (restless legs syndrome)    Sinusitis    Skin cancer    Type 2 diabetes mellitus ([place])    Past Surgical History:  Past Surgical History:  Procedure Laterality Date   BIOPSY  07/17/2018   Procedure: BIOPSY;  Surgeon: Danie Binder, MD;  [facility];  Service: Endoscopy;;  colon   BOWEL RESECTION  09/17/2018   SMALL BOWEL RESECTION: 71 CM    CARDIAC CATHETERIZATION N/A 07/21/2015   Procedure: Left Heart Cath and Coronary Angiography;  Surgeon: Peter M Martinique, MD;  Location: [place] [facility];  Service: Cardiovascular;  Laterality: N/A;   CARDIAC CATHETERIZATION N/A 07/21/2015   Procedure: Coronary Stent Intervention;  Surgeon: Peter M Martinique, MD;  Location: [place] [facility];  Service: Cardiovascular;  Laterality: N/A;   CIRCUMCISION N/A 04/05/2019   Procedure: CIRCUMCISION ADULT;  Surgeon: Irine Seal, MD;  Location: [facility];  Service: Urology;  Laterality: N/A;   COLONOSCOPY N/A 05/19/2014   Dr. Barnie Alderman diverticulosis/moderate external hemorrhoids, >20 simple adenomas. Genetic screening  negative.    COLONOSCOPY WITH PROPOFOL N/A 07/17/2018   Dr. Oneida Alar: Diverticulosis, external/internal hemorrhoids, 32 colon polyps removed.  ten tubular adenomas removed with no high-grade dysplasia.  Advised to have surveillance colonoscopy in 3 years.   COLONOSCOPY WITH PROPOFOL N/A 06/07/2021   Procedure: COLONOSCOPY WITH PROPOFOL;  Surgeon: Eloise Harman, DO;  [facility];  Service: Endoscopy;  Laterality: N/A;  9:30 / ASA 3  (Pt was told that his time will be given at Pre-op)   ESOPHAGOGASTRODUODENOSCOPY (EGD) WITH PROPOFOL N/A 07/17/2018   Dr. Oneida Alar: Low-grade narrowing Schatzki ring at the GE junction status post dilation.  Gastritis.  Biopsy with mild nonspecific reactive gastropathy.  No H. pylori.   GIVENS CAPSULE STUDY N/A 06/24/2019   normal   HERNIA REPAIR  1986   Left inguinal   INTRAVASCULAR PRESSURE WIRE/FFR STUDY Left 06/08/2017   Procedure: INTRAVASCULAR PRESSURE WIRE/FFR STUDY;  Surgeon: Nelva Bush, MD;  Location: [place] [facility];  Service: Cardiovascular;  Laterality: Left;  LAD and CFX   LAPAROTOMY N/A 09/17/2018   Procedure: EXPLORATORY LAPAROTOMY;  Surgeon: Virl Cagey, MD;  Location: [facility];  Service: General;  Laterality: N/A;   LEFT HEART CATH AND CORONARY ANGIOGRAPHY N/A 06/08/2017   Procedure: LEFT HEART CATH AND CORONARY ANGIOGRAPHY;  Surgeon: Nelva Bush, MD;  Location: [place] [facility];  Service: Cardiovascular;  Laterality: N/A;   POLYPECTOMY  07/17/2018   Procedure:  POLYPECTOMY;  Surgeon: Danie Binder, MD;  [facility];  Service: Endoscopy;;  colon   POLYPECTOMY  06/07/2021   Procedure: POLYPECTOMY INTESTINAL;  Surgeon: Eloise Harman, DO;  [facility];  Service: Endoscopy;;   ROTATOR CUFF REPAIR     Right   SAVORY DILATION N/A 07/17/2018   Procedure: SAVORY DILATION;  Surgeon: Danie Binder, MD;  [facility];  Service: Endoscopy;  Laterality: N/A;   Social History:  reports  that he has been smoking cigarettes. He started smoking about 51 years ago. He has a 19.50 pack-year smoking history. He has never used smokeless tobacco. He reports that he does not drink alcohol and does not use drugs.  Family / Support Systems Marital Status: Married Patient Roles: Spouse, Parent, Caregiver Spouse/Significant Other: [name] [phone]-cell [phone]-home Children: has custody of 2 and 20 yo grandsons Other Supports: Friends and church members Anticipated Caregiver: [name] Ability/Limitations of Caregiver: [name] works from home in [employer] here at [place] Availability: 24/7 Family Dynamics: Close knit with family and extended family. Pt really wants to recover and regain his independent level. He feels good and is glad to be here on rehab  Social History Preferred language: English Religion: Non-Denominational Cultural Background: No issues Education: [place] - How often do you need to have someone help you when you read instructions, pamphlets, or other written material from your doctor or pharmacy?: Never Writes: Yes Employment Status: Retired Public relations account executive Issues: No issues Guardian/Conservator: None-according to MD pt is capable of making his own decisions while here. Wife plans to be here daily also for support   Abuse/Neglect Abuse/Neglect Assessment Can Be Completed: Yes Physical Abuse: Denies Verbal Abuse: Denies Sexual Abuse: Denies Exploitation of patient/patient's resources: Denies Self-Neglect: Denies  Patient response to: Social Isolation - How often do you feel lonely or isolated from those around you?: Never  Emotional Status Pt's affect, behavior and adjustment status: Pt is motivated and feels did well in his therapy evaluation today. He has always been independent and taken care of himself, he is not one to ask for assist from others. Wife confirms this Recent Psychosocial Issues: other health issues-managed by  PCP Psychiatric History: History of anxiety takes medications for this and finds it helpful Substance Abuse History: Tobacco plans on quitting now due to lung issues and knows it would be better for his health. Aware of resources out there to assist with this.  Patient / Family Perceptions, Expectations & Goals Pt/Family understanding of illness & functional limitations: Pt and wife can explain his pneumonia and flu which he came in with. Both talk with the MD daily and feel have a good understanding of his treatment plan moving forward. Hopeful he will be here a short time and be home soon Premorbid pt/family roles/activities: Husband, father, grandfather, caregiver, friend, etc Anticipated changes in roles/activities/participation: resume Pt/family expectations/goals: Pt states: " I feel good today and I did good in PT session. Just glad I am still here."  Wife states: " I am hopeful he has done well these past few days."  [employer]: None Premorbid Home Care/DME Agencies: Other (Comment) (cane and CPAP machine at home) Transportation available at discharge: Pt did drive now will rely upon wife until cleared to drive again Is the patient able to respond to transportation needs?: Yes In the past 12 months, has lack of transportation kept you from medical appointments or from getting medications?: No In the  past 12 months, has lack of transportation kept you from meetings, work, or from getting things needed for daily living?: No Resource referrals recommended: Neuropsychology  Discharge Planning Living Arrangements: Spouse/significant other, Other relatives Support Systems: Spouse/significant other, Other relatives, Children, Friends/neighbors Type of Residence: Private residence Insurance Resources: Multimedia programmer (specify) ([employer] and [employer] Medicare switching to [facility] Medicare 08/29/2021) Financial Resources: Fish farm manager, Family Support Financial Screen  Referred: No Living Expenses: Medical laboratory scientific officer Management: Patient, Spouse Does the patient have any problems obtaining your medications?: No Home Management: Wife Patient/Family Preliminary Plans: Return home with wife who does work from home and can provide assist if needed. They also have custody of their two teenage grandsons ( 42 & 58 ) who live with them. Concern is once his insurance changes it may not cover for him to be here on CIR. Will ask he have therpay both days this weekend in order for him to make the most progress in the shortest period of time in case needs to discharge home sooner than expected. Care Coordinator Barriers to Discharge: Insurance for SNF coverage, Other (comments) Care Coordinator Barriers to Discharge Comments: Insurance switching on 08/29/2021 Care Coordinator Anticipated Follow Up Needs: HH/OP  Clinical Impression Pleasant gentleman who is motivated and grateful to be doing as well as he is. He plans to make some lifestyle changes-quitting smoking and eating better. His wife is very involved and here daily. She works from home and can be there with him. Concern is the insurance coverage changing as of 08/29/2021 and will try to get authorization with [employer], if per-chance does not aware of bill and will have another conversation regarding readiness for discharge. Have asked for both weekend therapies for this gentleman and wife will be participating in his therapies also.  Elease Hashimoto 08/26/2021, 10:53 AM

## 2021-08-26 NOTE — Evaluation (Signed)
Physical Therapy Assessment and Plan  Patient Details  Name: Shaun [name] MRN: [id number] Date of Birth: 04/03/1956  PT Diagnosis: Abnormal posture, Abnormality of gait, Difficulty walking, and Pain in R hip and L shoulder Rehab Potential: Good ELOS: 7-10 days   Today's Date: 08/26/2021 PT Individual Time: 0900-0958 PT Individual Time Calculation (min): 100 min    Hospital Problem: Principal Problem:   Acute respiratory failure ([place])   Past Medical History:  Past Medical History:  Diagnosis Date   Anemia    Anxiety    Asthma    CAD (coronary artery disease)    DES to distal circumflex 2016   Cataract    Colon polyps    30 colon polyps found on first colonoscopy   Diastolic heart failure (H[facility])    Diverticulitis    DJD (degenerative joint disease)    GERD (gastroesophageal reflux disease)    History of kidney stones    Hyperlipidemia    Hypertension    Insomnia    Obstructive sleep apnea 12/2009   01/26/2010 AHI 83/hr   Permanent atrial fibrillation ([place])    Onset 2006 paroxysmal then progressive to persistent   PUD (peptic ulcer disease)    1980s   RLS (restless legs syndrome)    Sinusitis    Skin cancer    Type 2 diabetes mellitus ([place])    Past Surgical History:  Past Surgical History:  Procedure Laterality Date   BIOPSY  07/17/2018   Procedure: BIOPSY;  Surgeon: Danie Binder, MD;  [facility];  Service: Endoscopy;;  colon   BOWEL RESECTION  09/17/2018   SMALL BOWEL RESECTION: 71 CM    CARDIAC CATHETERIZATION N/A 07/21/2015   Procedure: Left Heart Cath and Coronary Angiography;  Surgeon: Peter M Martinique, MD;  Location: [facility];  Service: Cardiovascular;  Laterality: N/A;   CARDIAC CATHETERIZATION N/A 07/21/2015   Procedure: Coronary Stent Intervention;  Surgeon: Peter M Martinique, MD;  Location: [facility];  Service: Cardiovascular;  Laterality: N/A;   CIRCUMCISION N/A 04/05/2019   Procedure: CIRCUMCISION ADULT;  Surgeon:  Irine Seal, MD;  Location: [facility];  Service: Urology;  Laterality: N/A;   COLONOSCOPY N/A 05/19/2014   Dr. Barnie Alderman diverticulosis/moderate external hemorrhoids, >20 simple adenomas. Genetic screening negative.    COLONOSCOPY WITH PROPOFOL N/A 07/17/2018   Dr. Oneida Alar: Diverticulosis, external/internal hemorrhoids, 32 colon polyps removed.  ten tubular adenomas removed with no high-grade dysplasia.  Advised to have surveillance colonoscopy in 3 years.   COLONOSCOPY WITH PROPOFOL N/A 06/07/2021   Procedure: COLONOSCOPY WITH PROPOFOL;  Surgeon: Eloise Harman, DO;  [facility];  Service: Endoscopy;  Laterality: N/A;  9:30 / ASA 3  (Pt was told that his time will be given at Pre-op)   ESOPHAGOGASTRODUODENOSCOPY (EGD) WITH PROPOFOL N/A 07/17/2018   Dr. Oneida Alar: Low-grade narrowing Schatzki ring at the GE junction status post dilation.  Gastritis.  Biopsy with mild nonspecific reactive gastropathy.  No H. pylori.   GIVENS CAPSULE STUDY N/A 06/24/2019   normal   HERNIA REPAIR  1986   Left inguinal   INTRAVASCULAR PRESSURE WIRE/FFR STUDY Left 06/08/2017   Procedure: INTRAVASCULAR PRESSURE WIRE/FFR STUDY;  Surgeon: Nelva Bush, MD;  Location: [place] [facility];  Service: Cardiovascular;  Laterality: Left;  LAD and CFX   LAPAROTOMY N/A 09/17/2018   Procedure: EXPLORATORY LAPAROTOMY;  Surgeon: Virl Cagey, MD;  Location: [facility];  Service: General;  Laterality: N/A;   LEFT HEART CATH AND  CORONARY ANGIOGRAPHY N/A 06/08/2017   Procedure: LEFT HEART CATH AND CORONARY ANGIOGRAPHY;  Surgeon: Nelva Bush, MD;  Location: [place] [facility];  Service: Cardiovascular;  Laterality: N/A;   POLYPECTOMY  07/17/2018   Procedure: POLYPECTOMY;  Surgeon: Danie Binder, MD;  [facility];  Service: Endoscopy;;  colon   POLYPECTOMY  06/07/2021   Procedure: POLYPECTOMY INTESTINAL;  Surgeon: Eloise Harman, DO;  [facility];  Service: Endoscopy;;   ROTATOR CUFF  REPAIR     Right   SAVORY DILATION N/A 07/17/2018   Procedure: SAVORY DILATION;  Surgeon: Danie Binder, MD;  [facility];  Service: Endoscopy;  Laterality: N/A;    Assessment & Plan Clinical Impression: Patient is a 65 year old right-handed male with history of anxiety, diastolic congestive heart failure, diabetes mellitus, hyperlipidemia, hypertension, obstructive sleep apnea/CPAP, PAF, COPD/tobacco use, atrial fibrillation maintained on Eliquis.,  November 2019 EGD with dilation of low-grade Schatzki's ring.  Per chart review patient lives with spouse.  1 level home one-step to entry.  Modified independent prior to admission.  Presented 08/06/2021 to [facility] with recent diagnosis of flu.  She was given steroid and Tessalon Perles per her report.  She spiked a fever to 102 with minimal relief.  She did have a cough only occasionally productive with green sputum.  No hemoptysis.  He did have some chest tightness and mild shortness of breath with exertion.  Patient had been vaccinated for COVID but not flu.  He was placed on Tamiflu in the ED temperature 99.7 heart rate 101-125 oxygen saturation 96% on 3 L no leukocytosis noted hemoglobin 15.  Chest x-ray no active disease.  Patient did require intubation for a short time.  She was transferred to [facility].  His Tamiflu has since been completed.  Hospital course complicated by her atrial fibrillation requiring IV amiodarone.  Echocardiogram with ejection fraction of 60 to 65% no wall motion abnormalities.  Initial nasogastric tube for nutritional support his diet was advanced to regular.  Therapy evaluations completed due to patient decreased functional mobility was admitted for a comprehensive rehab program. Patient transferred to CIR on 08/25/2021.   Patient currently requires min with mobility secondary to muscle weakness, decreased cardiorespiratoy endurance and decreased oxygen support, and decreased standing balance  and decreased balance strategies.  Prior to hospitalization, patient was modified independent  with mobility and lived with Spouse, Family (x2 grand children, 40 and 47 y/o) in a House home.  Home access is 2Stairs to enter.  Patient will benefit from skilled PT intervention to maximize safe functional mobility, minimize fall risk, and decrease caregiver burden for planned discharge home with 24 hour supervision.  Anticipate patient will benefit from follow up OP at discharge.  PT - End of Session Activity Tolerance: Tolerates 10 - 20 min activity with multiple rests Endurance Deficit: Yes Endurance Deficit Description: some dyspnea on exertion but vital signs WNL. Benefited from brief seated rest breaks b/w functional mobility tasks PT Assessment Rehab Potential (ACUTE/IP ONLY): Good PT Barriers to Discharge: Insurance for SNF coverage;Weight;New oxygen PT Patient demonstrates impairments in the following area(s): Balance;Motor;Endurance;Safety PT Transfers Functional Problem(s): Bed Mobility;Bed to Chair;Furniture;Car PT Locomotion Functional Problem(s): Ambulation;Stairs PT Plan PT Intensity: Minimum of 1-2 x/day ,45 to 90 minutes PT Frequency: 5 out of 7 days PT Duration Estimated Length of Stay: 7-10 days PT Treatment/Interventions: Ambulation/gait training;Balance/vestibular training;Cognitive remediation/compensation;Community reintegration;Discharge planning;Disease management/prevention;DME/adaptive equipment instruction;Functional electrical stimulation;Functional mobility training;Neuromuscular re-education;Pain management;Patient/family education;Psychosocial support;Skin care/wound  management;Splinting/orthotics;Stair training;Therapeutic Activities;Therapeutic Exercise;UE/LE Strength taining/ROM;UE/LE Coordination activities;Visual/perceptual remediation/compensation;Wheelchair propulsion/positioning PT Transfers Anticipated Outcome(s): mod I with LRAD PT Locomotion Anticipated  Outcome(s): mod I with LRAD PT Recommendation Follow Up Recommendations: Outpatient PT;24 hour supervision/assistance Patient destination: Home Equipment Recommended: To be determined   PT Evaluation Precautions/Restrictions Precautions Precautions: Fall Restrictions Weight Bearing Restrictions: No General Chart Reviewed: Yes Family/Caregiver Present: Yes (wife) Pain Pain Assessment Pain Scale: 0-10 Pain Score: 7  Pain Type: Chronic pain Pain Location: Hip Pain Orientation: Right Pain Descriptors / Indicators: Aching Pain Intervention(s): RN made aware Multiple Pain Sites: Yes 2nd Pain Site Pain Type: Chronic pain Pain Location: Shoulder Pain Orientation: Left Pain Descriptors / Indicators: Aching Pain Intervention(s): Repositioned;Emotional support;Ambulation/increased activity Pain Interference Pain Interference Pain Effect on Sleep: 4. Almost constantly Pain Interference with Therapy Activities: 4. Almost constantly Pain Interference with Day-to-Day Activities: 4. Almost constantly Home Living/Prior Functioning Home Living Living Arrangements: Spouse/significant other;Other relatives Available Help at Discharge: Family;Available 24 hours/day Type of Home: House Home Access: Stairs to enter [employer] of Steps: 2 Entrance Stairs-Rails: None Home Layout: One level Bathroom Shower/Tub: Door;Walk-in Radio producer: Standard Additional Comments: Ambulating short community (50 yards, per patient) distances mod I with cane (sometimes wouldn't use the cane)  Lives With: Spouse;Family (x2 grand children, 25 and 19 y/o) Prior Function Level of Independence: Other (comment);Needs assistance with ADLs Dressing: Moderate (socks, shoes, and shirt)  Able to Take Stairs?: Yes Driving: Yes Vocation: Retired Biomedical scientist: [employer] Leisure: Hobbies-yes (Comment) Clydene Laming working) Vision/Perception  Vision - History Ability to See in Adequate  Light: 0 Adequate Perception Perception: Within Functional Limits Praxis Praxis: Intact  Cognition Overall Cognitive Status: Within Functional Limits for tasks assessed Arousal/Alertness: Awake/alert Orientation Level: Oriented X4 Awareness: Appears intact Problem Solving: Appears intact Safety/Judgment: Appears intact Sensation Sensation Light Touch: Appears Intact Hot/Cold: Appears Intact Proprioception: Appears Intact Stereognosis: Appears Intact Coordination Gross Motor Movements are Fluid and Coordinated: No Coordination and Movement Description: Antalgic movement patterns related to chronic pain in R hip and L shoulder Heel Shin Test: WNL, limited some by hip flexor weakness Motor  Motor Motor: Other (comment) Motor - Skilled Clinical Observations: Generalized weakness and deconditioning   Trunk/Postural Assessment  Cervical Assessment Cervical Assessment: Exceptions to [facility] (forward head) Thoracic Assessment Thoracic Assessment: Exceptions to [facility] (rounded shoulders) Lumbar Assessment Lumbar Assessment: Exceptions to [facility] (posterior pelvic tilt) Postural Control Postural Control: Within Functional Limits  Balance Balance Balance Assessed: Yes Static Sitting Balance Static Sitting - Balance Support: Feet supported;No upper extremity supported Static Sitting - Level of Assistance: 5: Stand by assistance Dynamic Sitting Balance Dynamic Sitting - Balance Support: Feet unsupported;During functional activity Dynamic Sitting - Level of Assistance: 4: Min assist Static Standing Balance Static Standing - Balance Support: Bilateral upper extremity supported Static Standing - Level of Assistance: 4: Min assist Dynamic Standing Balance Dynamic Standing - Balance Support: Bilateral upper extremity supported;During functional activity Dynamic Standing - Level of Assistance: 4: Min assist Extremity Assessment      RLE Assessment RLE Assessment: Exceptions to [facility] RLE  Strength RLE Overall Strength: Deficits Right Hip Flexion: 2+/5 Right Knee Flexion: 3+/5 Right Knee Extension: 4-/5 Right Ankle Dorsiflexion: 4/5 LLE Assessment LLE Assessment: Exceptions to WFL LLE Strength LLE Overall Strength: Deficits Left Hip Flexion: 2+/5 Left Knee Flexion: 3+/5 Left Knee Extension: 4-/5 Left Ankle Dorsiflexion: 4/5  Care Tool Care Tool Bed Mobility Roll left and right activity   Roll left and right assist level: Supervision/Verbal cueing    Sit to  lying activity   Sit to lying assist level: Moderate Assistance - Patient 50 - 74%    Lying to sitting on side of bed activity   Lying to sitting on side of bed assist level: the ability to move from lying on the back to sitting on the side of the bed with no back support.: Moderate Assistance - Patient 50 - 74%     Care Tool Transfers Sit to stand transfer   Sit to stand assist level: Minimal Assistance - Patient > 75%    Chair/bed transfer   Chair/bed transfer assist level: Minimal Assistance - Patient > 75%     Physiological scientist transfer assist level: Minimal Assistance - Patient > 75%      Care Tool Locomotion Ambulation   Assist level: Minimal Assistance - Patient > 75% Assistive device: Walker-rolling Max distance: 8f  Walk 10 feet activity   Assist level: Minimal Assistance - Patient > 75% Assistive device: Walker-rolling   Walk 50 feet with 2 turns activity Walk 50 feet with 2 turns activity did not occur: Safety/medical concerns (fatigue and R hip pain)      Walk 150 feet activity Walk 150 feet activity did not occur: Safety/medical concerns      Walk 10 feet on uneven surfaces activity Walk 10 feet on uneven surfaces activity did not occur: Safety/medical concerns      Stairs Stair activity did not occur: Safety/medical concerns (fatigue and R hip pain)        Walk up/down 1 step activity Walk up/down 1 step or curb (drop down) activity did not occur:  Safety/medical concerns      Walk up/down 4 steps activity Walk up/down 4 steps activity did not occur: Safety/medical concerns      Walk up/down 12 steps activity Walk up/down 12 steps activity did not occur: Safety/medical concerns      Pick up small objects from floor Pick up small object from the floor (from standing position) activity did not occur: Safety/medical concerns      Wheelchair Is the patient using a wheelchair?: No          Wheel 50 feet with 2 turns activity      Wheel 150 feet activity        Refer to Care Plan for Long Term Goals  SHORT TERM GOAL WEEK 1 PT Short Term Goal 1 (Week 1): STG = LTG due to ELOS  Recommendations for other services: None   Skilled Therapeutic Intervention Mobility Bed Mobility Bed Mobility: Rolling Right;Rolling Left;Supine to Sit Rolling Right: Supervision/verbal cueing Rolling Left: Supervision/Verbal cueing Supine to Sit: Moderate Assistance - Patient 50-74% (for trunk support) Transfers Transfers: Sit to Stand;Stand to Sit;Stand Pivot Transfers Sit to Stand: Minimal Assistance - Patient > 75% Stand to Sit: Minimal Assistance - Patient > 75% Stand Pivot Transfers: Minimal Assistance - Patient > 75% Stand Pivot Transfer Details: Tactile cues for weight shifting;Tactile cues for posture;Verbal cues for gait pattern;Verbal cues for precautions/safety;Verbal cues for safe use of DME/AE;Verbal cues for technique;Verbal cues for sequencing Transfer (Assistive device): Rolling walker Locomotion  Gait Ambulation: Yes Gait Assistance: Minimal Assistance - Patient > 75% Gait Distance (Feet): 25 Feet Assistive device: Rolling walker Gait Assistance Details: Verbal cues for gait pattern;Verbal cues for precautions/safety;Verbal cues for safe use of DME/AE;Verbal cues for technique;Verbal cues for sequencing;Tactile cues for weight shifting;Tactile cues for posture;Visual cues for safe use of DME/AE Gait  Gait: Yes Gait Pattern:  Impaired Gait Pattern: Step-through pattern;Decreased step length - left;Decreased stance time - right;Decreased hip/knee flexion - right;Decreased stride length;Decreased weight shift to right;Antalgic;Trunk flexed Gait velocity: decreased Stairs / Additional Locomotion Stairs: No Wheelchair Mobility Wheelchair Mobility: No  Skilled Intervention: Pt presents supine in bed with wife at bedside. Pt agreeable to PT evaluation. See pain section above but pt has chronic R hip and L shoulder pains related to arthritis. Reports he was planned to have R hip replacement in March 2023. Retrieved 20x18 w/c from DME closet to assist with transportation b/w rehab gyms and room for energy conservation. Pt on RA, resting 90-92%. With mobility, including gait, he maintained oxygen saturations 95-98% on RA. Overall, he completes rolling in bed with supervision, supine<>sit with modA (for trunk support), minA for sit<>stand transfers to RW, stand<>pivot transfers with minA and RW, car transfers with minA, and gait up to 78f with minA and RW. Primary gait deficits include forward flexed trunk, decreased weight shift and stance time on R, antalgic due R hip pain, and some dyspnea on exertion despite vital signs WNL. Pt concluded session seated in w/c with chair alarm on, wife at bedside, all needs within reach.   Instructed pt in results of PT evaluation as detailed above, PT POC, rehab potential, rehab goals, and discharge recommendations. Additionally discussed CIR's policies regarding fall safety and use of chair alarm and/or quick release belt. Pt verbalized understanding and in agreement.   Discharge Criteria: Patient will be discharged from PT if patient refuses treatment 3 consecutive times without medical reason, if treatment goals not met, if there is a change in medical status, if patient makes no progress towards goals or if patient is discharged from hospital.  The above assessment, treatment plan,  treatment alternatives and goals were discussed and mutually agreed upon: by patient  CBarstowPT 08/26/2021, 10:52 AM

## 2021-08-27 DIAGNOSIS — R5381 Other malaise: Secondary | ICD-10-CM | POA: Diagnosis not present

## 2021-08-27 DIAGNOSIS — I5032 Chronic diastolic (congestive) heart failure: Secondary | ICD-10-CM | POA: Diagnosis not present

## 2021-08-27 DIAGNOSIS — G479 Sleep disorder, unspecified: Secondary | ICD-10-CM | POA: Diagnosis not present

## 2021-08-27 DIAGNOSIS — I4819 Other persistent atrial fibrillation: Secondary | ICD-10-CM | POA: Diagnosis not present

## 2021-08-27 LAB — GLUCOSE, CAPILLARY
Glucose-Capillary: 108 mg/dL — ABNORMAL HIGH (ref 70–99)
Glucose-Capillary: 113 mg/dL — ABNORMAL HIGH (ref 70–99)
Glucose-Capillary: 126 mg/dL — ABNORMAL HIGH (ref 70–99)
Glucose-Capillary: 124 mg/dL — ABNORMAL HIGH (ref 70–99)

## 2021-08-27 NOTE — IPOC Note (Signed)
Overall Plan of Care [facility]) Patient Details Name: Shaun [name] MRN: [id number] DOB: 04/26/1956  Admitting Diagnosis: Acute respiratory failure [facility])  Hospital Problems: Principal Problem:   Acute respiratory failure ([place])     Functional Problem List: Nursing Medication Management, Safety, Bowel, Nutrition, Endurance  PT Balance, Motor, Endurance, Safety  OT Balance, Endurance, Motor, Pain  SLP    TR         Basic ADLs: OT Grooming, Bathing, Dressing, Toileting     Advanced  ADLs: OT       Transfers: PT Bed Mobility, Bed to Chair, Furniture, Teacher, early years/pre, Metallurgist: PT Ambulation, Stairs     Additional Impairments: OT None  SLP        TR      Anticipated Outcomes Item Anticipated Outcome  Self Feeding independent  Swallowing      Basic self-care  supervision  Toileting  mod I   Bathroom Transfers mod I toilet transfers, supervision with shower transfers  Bowel/Bladder  Manage bowel w  mod I assist  Transfers  mod I with LRAD  Locomotion  mod I with LRAD  Communication     Cognition     Pain  N/A  Safety/Judgment  maintain safety with cues   Therapy Plan: PT Intensity: Minimum of 1-2 x/day ,45 to 90 minutes PT Frequency: 5 out of 7 days PT Duration Estimated Length of Stay: 7-10 days OT Intensity: Minimum of 1-2 x/day, 45 to 90 minutes OT Frequency: 5 out of 7 days OT Duration/Estimated Length of Stay: 7-10 days     Due to the current state of emergency, patients may not be receiving their 3-hours of Medicare-mandated therapy.   Team Interventions: Nursing Interventions Disease Management/Prevention, Medication Management, Discharge Planning, Bowel Management, Patient/Family Education  PT interventions Ambulation/gait training, Training and development officer, Cognitive remediation/compensation, Community reintegration, Discharge planning, Disease management/prevention, DME/adaptive equipment instruction, Functional  electrical stimulation, Functional mobility training, Neuromuscular re-education, Pain management, Patient/family education, Psychosocial support, Skin care/wound management, Splinting/orthotics, Stair training, Therapeutic Activities, Therapeutic Exercise, UE/LE Strength taining/ROM, UE/LE Coordination activities, Visual/perceptual remediation/compensation, Wheelchair propulsion/positioning  OT Interventions Balance/vestibular training, Discharge planning, DME/adaptive equipment instruction, Functional mobility training, Patient/family education, Pain management, Self Care/advanced ADL retraining, Psychosocial support, Therapeutic Activities, Therapeutic Exercise  SLP Interventions    TR Interventions    SW/CM Interventions Discharge Planning, Psychosocial Support, Patient/Family Education   Barriers to Discharge MD  Medical stability  Nursing Decreased caregiver support, Weight 1 level 1 ste with wife  PT Insurance for SNF coverage, Weight, New oxygen    OT      SLP      SW Insurance for SNF coverage, Other (comments) Insurance switching on 08/29/2021   Team Discharge Planning: Destination: PT-Home ,OT- Home , SLP-  Projected Follow-up: PT-Outpatient PT, 24 hour supervision/assistance, OT-  Home health OT, SLP-  Projected Equipment Needs: PT-To be determined, OT- Tub/shower seat, SLP-  Equipment Details: PT- , OT-wife plans to get a shower seat on her own Patient/family involved in discharge planning: PT- Patient, Family Midwife,  OT-Patient, Family member/caregiver, SLP-   MD ELOS: 7-10 days Medical Rehab Prognosis:  Excellent Assessment: The patient has been admitted for CIR therapies with the diagnosis of debility after respiratory failure. The team will be addressing functional mobility, strength, stamina, balance, safety, adaptive techniques and equipment, self-care, bowel and bladder mgt, patient and caregiver education, pain mgt, breathing techniques, community reentry.  Goals have been set at mod I to supervision with self-care  and mod I with mobility for short dx (given left hip).   Due to the current state of emergency, patients may not be receiving their 3 hours per day of Medicare-mandated therapy.    Meredith Staggers, MD, FAAPMR     See Team Conference Notes for weekly updates to the plan of care

## 2021-08-27 NOTE — Progress Notes (Signed)
Physical Therapy Session Note  Patient Details  Name: Shaun [name] MRN: [id number] Date of Birth: November 07, 1955  Today's Date: 08/27/2021 PT Individual Time: 7619-5093 PT Individual Time Calculation (min): 60 min   Short Term Goals: Week 1:  PT Short Term Goal 1 (Week 1): STG = LTG due to ELOS  Skilled Therapeutic Interventions/Progress Updates:    Pt seated in w/c on arrival and agreeable to therapy. Pt's wife [name] present throughout session. Pt reports 4/10 hip pain at start of session, premedicated. Rest and positioning provided as needed. Pt directed in 5xSTS as noted below. Pt then ambulated 2 x 40 ft with seated rest break. Extensive conversation about energy conservation, both at home and during rehab stay, as well as analogies to describe RPE (gas tank). Pt expressed understanding and utilized this language to communicate with therapist. Attempted 6MWT, but pt had "hip catch" that prevented him from continuing. At this time, pt asked therapist about exercises he could on his own since hip exercise had improved his pain the previous day. Pt instructed on and performed the following: isometric LAQ with bicep curl, seated marches, seated abduction/adduction, seated hamstring curl with yellow theraband, glute sets. Pt remained in chair at end of session and remained with his wife present.   Five times Sit to Stand Test (FTSS) Method: Use a straight back chair with a solid seat that is 16-18 high. Ask participant to sit on the chair with arms folded across their chest.   Instructions: Stand up and sit down as quickly as possible 5 times, keeping your arms folded across your chest.   Measurement: Stop timing when the participant stands the 5th time.  TIME: ___24___ (in seconds)  Times > 13.6 seconds is associated with increased disability and morbidity (Guralnik, 2000) Times > 15 seconds is predictive of recurrent falls in healthy individuals aged 50 and older (Buatois, et al.,  2008) Normal performance values in community dwelling individuals aged 14 and older (Bohannon, 2006): 60-69 years: 11.4 seconds 70-79 years: 12.6 seconds 80-89 years: 14.8 seconds  MCID: ? 2.3 seconds for Vestibular Disorders Mariah Milling, 2006)   Therapy Documentation Precautions:  Precautions Precautions: Fall Restrictions Weight Bearing Restrictions: No    Therapy/Group: Individual Therapy  Mickel Fuchs 08/27/2021, 12:52 PM

## 2021-08-27 NOTE — Progress Notes (Signed)
Physical Therapy Session Note  Patient Details  Name: Shaun [name] MRN: [id number] Date of Birth: Jul 05, 1956  Today's Date: 08/27/2021 PT Individual Time: 1315-1400 PT Individual Time Calculation (min): 45 min   Short Term Goals: Week 1:  PT Short Term Goal 1 (Week 1): STG = LTG due to ELOS  Skilled Therapeutic Interventions/Progress Updates: Pt presented in w/c agreeable to therapy. Pt states pain currently controled in hip and shoulder, not rated but rest breaks provided throughout session as needed. Pt transported to rehab gym for energy conservation. Pt participate din ambulation for endurance approx 76f x 2 with RW and CGA with w/c follow for rest as needed. Pt then participated in seated and standing use of rebounder with soccer ball pt was able to perform x 20 in sitting and x 10 in standing with RW. Pt did require increased rest breaks between bouts for recovery. During rest breaks discussed importance of energy conservation during day. Pt also participated in x 1 sanding round of horseshoes with RW support but only able to toss x7 before requiring seated rest. With increased fatigue pt noted to have increased tremors. Pt transported back to room and performed ambulatory transfer to bed and sit to supine with supervision on flat bed. Pt left in bed at end of session resting comfortably with bed alarm on, call bell within reach and needs met.      Therapy Documentation Precautions:  Precautions Precautions: Fall Restrictions Weight Bearing Restrictions: No General: PT Amount of Missed Time (min): 15 Minutes PT Missed Treatment Reason: Other (Comment) Vital Signs: Therapy Vitals Temp: 98.1 F (36.7 C) Temp Source: Oral Pulse Rate: 88 Resp: 16 BP: 95/65 Patient Position (if appropriate): Sitting Oxygen Therapy SpO2: 95 % O2 Device: Room Air Pain:   Mobility:   Locomotion :    Trunk/Postural Assessment :    Balance:   Exercises:   Other Treatments:       Therapy/Group: Individual Therapy  Marnee Sherrard 08/27/2021, 4:03 PM

## 2021-08-27 NOTE — Progress Notes (Signed)
PROGRESS NOTE   Subjective/Complaints: Had a much better night with trazodone. Up with OT this morning. Right hip still a problem (chronic)  ROS: Patient denies fever, rash, sore throat, blurred vision, nausea, vomiting, diarrhea, cough, shortness of breath or chest pain,  headache, or mood change.    Objective:   No results found. Recent Labs    08/26/21 0526  WBC 5.2  HGB 11.9*  HCT 36.4*  PLT 150   Recent Labs    08/26/21 0526  NA 139  K 3.4*  CL 107  CO2 25  GLUCOSE 140*  BUN 19  CREATININE 0.81  CALCIUM 7.7*    Intake/Output Summary (Last 24 hours) at 08/27/2021 0920 Last data filed at 08/27/2021 0757 Gross per 24 hour  Intake 280 ml  Output 400 ml  Net -120 ml        Physical Exam: Vital Signs Blood pressure 106/78, pulse 71, temperature 97.7 F (36.5 C), temperature source Axillary, resp. rate 18, height 5\' 7"  (1.702 m), weight 89.6 kg, SpO2 97 %.  Constitutional: No distress . Vital signs reviewed. HEENT: NCAT, EOMI, oral membranes moist Neck: supple Cardiovascular: RRR without murmur. No JVD    Respiratory/Chest: CTA Bilaterally without wheezes or rales. Better air movement today   GI/Abdomen: BS +, non-tender, non-distended Ext: no clubbing, cyanosis, or edema Psych: pleasant and cooperative  Skin: Clean and intact without signs of breakdown Neuro:  Alert and oriented x 3. Normal insight and awareness. Mild STM deficits?. Normal language and speech. Cranial nerve exam unremarkable. UE motor 5/5. LE 4-/5 prox to 5-/5 distally. No sensory deficits.normal cerebellar exam Musculoskeletal: right hip pain with AROM/PROM.     Assessment/Plan: 1. Functional deficits which require 3+ hours per day of interdisciplinary therapy in a comprehensive inpatient rehab setting. Physiatrist is providing close team supervision and 24 hour management of active medical problems listed below. Physiatrist and  rehab team continue to assess barriers to discharge/monitor patient progress toward functional and medical goals  Care Tool:  Bathing    Body parts bathed by patient: Chest, Left arm, Right arm, Abdomen, Front perineal area, Buttocks, Right upper leg, Left upper leg, Face, Left lower leg, Right lower leg         Bathing assist Assist Level: Contact Guard/Touching assist     Upper Body Dressing/Undressing Upper body dressing   What is the patient wearing?: Pull over shirt    Upper body assist Assist Level: Contact Guard/Touching assist    Lower Body Dressing/Undressing Lower body dressing      What is the patient wearing?: Underwear/pull up, Pants     Lower body assist Assist for lower body dressing: Minimal Assistance - Patient > 75%     Toileting Toileting    Toileting assist Assist for toileting: Minimal Assistance - Patient > 75%     Transfers Chair/bed transfer  Transfers assist     Chair/bed transfer assist level: Minimal Assistance - Patient > 75%     Locomotion Ambulation   Ambulation assist      Assist level: Minimal Assistance - Patient > 75% Assistive device: Walker-rolling Max distance: 21ft   Walk 10 feet activity   Assist  Assist level: Minimal Assistance - Patient > 75% Assistive device: Walker-rolling   Walk 50 feet activity   Assist Walk 50 feet with 2 turns activity did not occur: Safety/medical concerns (fatigue and R hip pain)         Walk 150 feet activity   Assist Walk 150 feet activity did not occur: Safety/medical concerns         Walk 10 feet on uneven surface  activity   Assist Walk 10 feet on uneven surfaces activity did not occur: Safety/medical concerns         Wheelchair     Assist Is the patient using a wheelchair?: No             Wheelchair 50 feet with 2 turns activity    Assist            Wheelchair 150 feet activity     Assist          Blood pressure  106/78, pulse 71, temperature 97.7 F (36.5 C), temperature source Axillary, resp. rate 18, height 5\' 7"  (1.702 m), weight 89.6 kg, SpO2 97 %.  Medical Problem List and Plan: 1.  Debility functional deficits secondary to acute respiratory failure/COPD/OSA             -patient may shower             -ELOS/Goals: ?7-9 days/ MinA  -Continue CIR therapies including PT, OT  2.  Antithrombotics: -DVT/anticoagulation:  Pharmaceutical: Other (comment) Eliquis             -antiplatelet therapy: N/A 3. Pain Management: Tylenol as needed  -add tramadol prn per previous routine 4. Depression:  Continue Lexapro 20 mg daily             -antipsychotic agents: Abilify 2 mg daily 5. Neuropsych: This patient is capable of making decisions on his own behalf. 6. Skin/Wound Care: Routine skin checks 7. Fluids/Electrolytes/Nutrition: encourage po  -K+ 3.4--> supplementing  -recheck bmet monday 8.  Diastolic congestive heart failure.  Lasix 20 mg twice daily.  Monitor for any signs of fluid overload   Filed Weights   08/25/21 1444 08/25/21 1508 08/27/21 0400  Weight: 89.7 kg 89.7 kg 89.6 kg    12/30 weights stable 9.  Hyperlipidemia Lipitor/Zetia 10.  Diabetes mellitus.  Hemoglobin A1c 6.9.  SSI 11.  Atrial fibrillation.  Continue Eliquis.  Lopressor 100 mg twice daily.  Cardiac rate controlled.  Follow-up cardiology service.  -HR regular, controlled at present,  12.  COPD/tobacco use/OSA.  Continue CPAP.  Provide counsel regarding cessation of nicotine products. 13.  GERD.  Continue Protonix 14. Insomnia: improved with scheduled trazodone    LOS: 2 days A FACE TO FACE EVALUATION WAS PERFORMED  Meredith Staggers 08/27/2021, 9:20 AM

## 2021-08-27 NOTE — Progress Notes (Signed)
Occupational Therapy Session Note  Patient Details  Name: Shaun [name] MRN: [id number] Date of Birth: November 22, 1955  Today's Date: 08/27/2021 OT Individual Time: 0830-0930 OT Individual Time Calculation (min): 60 min    Short Term Goals: Week 1:  OT Short Term Goal 1 (Week 1): STGs = LTGs  Skilled Therapeutic Interventions/Progress Updates:    Pt seen this session for pt and family education with his wife.  Pt stated he had already completed self care this morning and had clean clothing on.  His wife purchased a hip kit from [employer] along with a bed step lift.   Pt stated that at home he always gets out of bed on the Right side so he can push up to sit with his R hand.  Had pt do it to that side, and he was able to sit up with S.  He did feel a little dizzy so cued pt to sit for a few minutes before standing.  Because the R side of bed is near the wall, he did not have a lot of space to move with the Rw. Took advantage of this situation to have pt work on side stepping through small spaces with RW.  He did very well with this with light CGA.   He ambulated to his wc and then was taken to ADL apt to work on sit to stands from low surfaces.  He will likely sit in recliner at home.  He practiced sitting down in recliner and followed cues to lean forward and shift his weight to get up from low seat. He did well with CGA.  While in apartment, discussed grab bars with pt and wife.  Demonstrated use of suction cup bar but did recommend she use standard bars. She said she would get them installed.  Pt feels he is doing well and wife stated he is doing better than when he was home.   Discussed his health history and his smoking. Pt stated he has no desire to smoke.  Discussed and made suggestions of healthy substitute habits in case his desire to smoke should return. Discussed possible discharge date.  Will plan on pt going home on Wednesday of next week as long as team agrees in team conference next  week.   Pt returned to room with all needs met.   Therapy Documentation Precautions:  Precautions Precautions: Fall Restrictions Weight Bearing Restrictions: No  Pain: 2/10 R hip- premedicated    Therapy/Group: Individual Therapy  [place] 08/27/2021, 12:13 PM

## 2021-08-27 NOTE — Progress Notes (Signed)
Physical Therapy Session Note  Patient Details  Name: Shaun [name] MRN: [id number] Date of Birth: 31-Mar-1956  Today's Date: 08/27/2021 PT Individual Time: 1132-1201 PT Individual Time Calculation (min): 29 min   Short Term Goals: Week 1:  PT Short Term Goal 1 (Week 1): STG = LTG due to ELOS  Skilled Therapeutic Interventions/Progress Updates:  Patient seated upright in w/c on entrance to room. Patient alert and agreeable to PT session.   Mild pain complaint this session re: R hip arthritic pain while initiating treatment on NuStep; pain managed with movement and repositioning.   Therapeutic Activity: Transfers: Patient performed sit<>stand and stand pivot transfers throughout session with supervision using RW and progressed to no AD. Provided verbal cues for increasing forward lean and positioning/ progression of BUE.  Therapeutic Exercise: Pt guided in continuous reciprocation of BUE and BLE using NuStep L3 x 15 min with focus on improving cardiorespiratory endurance and overall activity tolerance. Pt maintains good pace between 36-40 steps/ min, 1.8 to 2.0 METs, and only requires very brief rest breaks throughout.    Patient seated upright in w/c at end of session with brakes locked, seat pad alarm set, and all needs within reach.     Therapy Documentation Precautions:  Precautions Precautions: Fall Restrictions Weight Bearing Restrictions: No  Pain: Mild pain complaint this session re: R hip arthritic pain, managed with movement and repositioning.   Therapy/Group: Individual Therapy  Alger Simons PT, DPT 08/27/2021, 4:55 PM

## 2021-08-28 DIAGNOSIS — J9600 Acute respiratory failure, unspecified whether with hypoxia or hypercapnia: Secondary | ICD-10-CM | POA: Diagnosis not present

## 2021-08-28 LAB — GLUCOSE, CAPILLARY
Glucose-Capillary: 90 mg/dL (ref 70–99)
Glucose-Capillary: 149 mg/dL — ABNORMAL HIGH (ref 70–99)
Glucose-Capillary: 105 mg/dL — ABNORMAL HIGH (ref 70–99)
Glucose-Capillary: 82 mg/dL (ref 70–99)
Glucose-Capillary: 153 mg/dL — ABNORMAL HIGH (ref 70–99)

## 2021-08-28 MED ORDER — MAGNESIUM GLUCONATE 500 MG PO TABS
250.0000 mg | ORAL_TABLET | Freq: Every day | ORAL | Status: DC
  Administered 2021-08-28 – 2021-08-29 (×2): 250 mg via ORAL
  Filled 2021-08-28 (×3): qty 1

## 2021-08-28 MED ORDER — METOPROLOL TARTRATE 50 MG PO TABS
75.0000 mg | ORAL_TABLET | Freq: Two times a day (BID) | ORAL | Status: DC
  Administered 2021-08-29 – 2021-08-30 (×3): 75 mg via ORAL
  Filled 2021-08-28 (×3): qty 1

## 2021-08-28 NOTE — Progress Notes (Signed)
PROGRESS NOTE   Subjective/Complaints: No new complaints His wife is very pleased with his progress and she was told he may d/c on Monday or Tues. She asks about home therapy  ROS: Patient deniesfever, rash, sore throat, blurred vision, nausea, vomiting, diarrhea, cough, shortness of breath or chest pain,  headache, or mood change.    Objective:   No results found. Recent Labs    08/26/21 0526  WBC 5.2  HGB 11.9*  HCT 36.4*  PLT 150   Recent Labs    08/26/21 0526  NA 139  K 3.4*  CL 107  CO2 25  GLUCOSE 140*  BUN 19  CREATININE 0.81  CALCIUM 7.7*    Intake/Output Summary (Last 24 hours) at 08/28/2021 1139 Last data filed at 08/28/2021 0701 Gross per 24 hour  Intake 480 ml  Output 1350 ml  Net -870 ml        Physical Exam: Vital Signs Blood pressure 108/75, pulse 84, temperature 97.8 F (36.6 C), temperature source Oral, resp. rate 17, height 5\' 7"  (1.702 m), weight 89.5 kg, SpO2 97 %. Gen: no distress, normal appearing HEENT: oral mucosa pink and moist, NCAT Cardio: Reg rate Chest: normal effort, normal rate of breathing Abd: soft, non-distended Ext: no edema Psych: pleasant, normal affect Skin: no breakdown Neuro:  Alert and oriented x 3. Normal insight and awareness. Mild STM deficits?. Normal language and speech. Cranial nerve exam unremarkable. UE motor 5/5. LE 4-/5 prox to 5-/5 distally. No sensory deficits.normal cerebellar exam Musculoskeletal: right hip pain with AROM/PROM.     Assessment/Plan: 1. Functional deficits which require 3+ hours per day of interdisciplinary therapy in a comprehensive inpatient rehab setting. Physiatrist is providing close team supervision and 24 hour management of active medical problems listed below. Physiatrist and rehab team continue to assess barriers to discharge/monitor patient progress toward functional and medical goals  Care Tool:  Bathing    Body  parts bathed by patient: Chest, Left arm, Right arm, Abdomen, Front perineal area, Buttocks, Right upper leg, Left upper leg, Face, Left lower leg, Right lower leg         Bathing assist Assist Level: Contact Guard/Touching assist     Upper Body Dressing/Undressing Upper body dressing   What is the patient wearing?: Pull over shirt    Upper body assist Assist Level: Contact Guard/Touching assist    Lower Body Dressing/Undressing Lower body dressing      What is the patient wearing?: Underwear/pull up, Pants     Lower body assist Assist for lower body dressing: Minimal Assistance - Patient > 75%     Toileting Toileting    Toileting assist Assist for toileting: Minimal Assistance - Patient > 75%     Transfers Chair/bed transfer  Transfers assist     Chair/bed transfer assist level: Contact Guard/Touching assist     Locomotion Ambulation   Ambulation assist      Assist level: Contact Guard/Touching assist Assistive device: Walker-rolling Max distance: 46ft   Walk 10 feet activity   Assist     Assist level: Contact Guard/Touching assist Assistive device: Walker-rolling   Walk 50 feet activity   Assist Walk 50 feet with  2 turns activity did not occur: Safety/medical concerns (fatigue and R hip pain)         Walk 150 feet activity   Assist Walk 150 feet activity did not occur: Safety/medical concerns         Walk 10 feet on uneven surface  activity   Assist Walk 10 feet on uneven surfaces activity did not occur: Safety/medical concerns         Wheelchair     Assist Is the patient using a wheelchair?: No             Wheelchair 50 feet with 2 turns activity    Assist            Wheelchair 150 feet activity     Assist          Blood pressure 108/75, pulse 84, temperature 97.8 F (36.6 C), temperature source Oral, resp. rate 17, height 5\' 7"  (1.702 m), weight 89.5 kg, SpO2 97 %.  Medical Problem List  and Plan: 1.  Debility functional deficits secondary to acute respiratory failure/COPD/OSA             -patient may shower             -ELOS/Goals: 6 days/ MinA  Continue CIR therapies including PT, OT  2.  Impaired mobility: discussed home therapy 3. Pain Management: Tylenol as needed  -add tramadol prn per previous routine 4. Depression: Continue Lexapro 20 mg daily             -antipsychotic agents: Abilify 2 mg daily 5. Neuropsych: This patient is capable of making decisions on his own behalf. 6. Skin/Wound Care: Routine skin checks 7. Fluids/Electrolytes/Nutrition: encourage po  -K+ 3.4--> supplementing  -recheck bmet monday 8.  Diastolic congestive heart failure.  Lasix 20 mg twice daily.  Monitor for any signs of fluid overload. Check vitamin D level on Monday   Filed Weights   08/25/21 1508 08/27/21 0400 08/28/21 0500  Weight: 89.7 kg 89.6 kg 89.5 kg    12/30 weights stable 9.  Hyperlipidemia Lipitor/Zetia 10.  Diabetes mellitus.  Hemoglobin A1c 6.9.  SSI 11.  Atrial fibrillation.  Continue Eliquis.  Lopressor 100 mg twice daily.  Cardiac rate controlled.  Follow-up cardiology service. Magnesium level 1.8 on 12/26. Supplement started  -HR regular, controlled at present,  12.  COPD/tobacco use/OSA.  Continue CPAP.  Provide counsel regarding cessation of nicotine products. 13.  GERD.  Continue Protonix 14. Insomnia: improved with scheduled trazodone    LOS: 3 days A FACE TO FACE EVALUATION WAS PERFORMED  Clide Deutscher Jance Siek 08/28/2021, 11:39 AM

## 2021-08-28 NOTE — Progress Notes (Signed)
Occupational Therapy Session Note  Patient Details  Name: Shaun [name] MRN: [id number] Date of Birth: [birth year]  Today's Date: 08/28/2021 OT Individual Time: 1593-0123 OT Individual Time Calculation (min): 47 min  and Today's Date: 08/28/2021 OT Missed Time: 10 Minutes Missed Time Reason: Patient fatigue   Short Term Goals: Week 1:  OT Short Term Goal 1 (Week 1): STGs = LTGs  Skilled Therapeutic Interventions/Progress Updates:    Pt received seated in w/c with son present, no c/o pain, agreeable to therapy. Session focus on activity tolerance, BUE/BLE strengthening, energy conservation education in prep for improved ADL/IADL/func mobility performance + decreased caregiver burden. Declines need for ADL this date and requesting to work on Nustep/kinetron. Feels his endurance is his primary limiting factor. Total A w/c transport to and from gym for energy conservation. Pt completed x2 short ambulatory transfer with RW and CGA.   Completed 12.5 total min on nustep in 2.5 min bouts at level /10 resistance and use of BUE/BLE. Averaged step length of 40 steps/ minute. Pt rates RPE at 11/20, but reports that his "legs feel like jelly." HR in 70-80s bpm throughout and satO2 at 97+% on RA.  Completed 3 min total on kinetron in 1.5 min before ceasing activity due to fatigue. Declines further activity stating "I've given you all I have." Reports 12/20 on RPE post activity.  Educated throughout on importance of self-monitoring fatigue level to decrease falls risk, but encouraged active/ supervised ADL/IADL participation upon DC to maintain well-being/activity tolerance. Pt reports having nustep at home.  Pt left seated in w/c with chair pad alarm engaged, call bell in reach, and all immediate needs met.    Therapy Documentation Precautions:  Precautions Precautions: Fall Restrictions Weight Bearing Restrictions: No  Pain: no c/o ADL: See Care Tool for more details.  Therapy/Group:  Individual Therapy  Volanda Napoleon MS, OTR/L  08/28/2021, 6:52 AM

## 2021-08-28 NOTE — Progress Notes (Signed)
Physical Therapy Session Note  Patient Details  Name: Shaun [name] MRN: [id number] Date of Birth: 03-01-1956  Today's Date: 08/28/2021 PT Individual Time: 0938-1050 PT Individual Time Calculation (min): 72 min   Short Term Goals: Week 1:  PT Short Term Goal 1 (Week 1): STG = LTG due to ELOS  Skilled Therapeutic Interventions/Progress Updates:   Pt received sitting in WC and agreeable to PT.   Gait training in rehab gym with RW 2 x 38f with CGA for safety. Min cues for posture and safety for AD management in transition to .    Standing balance/tolerance while engaged in Wii bowling. Able to tolerate 3 +2 frames in standing, then performed addition frames in sitting for active rest break Step management training on 2 inch step, 4 inch step and 6 inch sttep in parallel bars. Each performed 4 x 2 with UE support. Noted to have mild ankle restriction limited to neutral  Supine therex on mat table. SLR, SAQ, hip/knee flexion/extension, hip abduction, bridges ankle PF, DF. Each performed 2 x 10 with cues for full ROM and AAROM on the LLE intermittently. Prolonged heel cord stretch 2 x 1 min on BLE bed mobility with min assist into and out of supine with assist at trunk.   [facility] returned to [facility] and [facility] and all needs met.        Therapy Documentation Precautions:  Precautions Precautions: Fall Restrictions Weight Bearing Restrictions: No    Pain: Pain Assessment Pain Scale: 0-10 Pain Score: 5  Pain Type: Acute pain Pain Location: Hip Pain Orientation: Right Pain Intervention(s): Medication (See eMAR)    Therapy/Group: Individual Therapy  ALorie Phenix12/31/2022, 10:58 AM

## 2021-08-28 NOTE — Progress Notes (Signed)
Physical Therapy Session Note  Patient Details  Name: Shaun [name] MRN: [id number] Date of Birth: 03-18-1956  Today's Date: 08/28/2021 PT Individual Time: 0804-0858 PT Individual Time Calculation (min): 54 min   Short Term Goals: Week 1:  PT Short Term Goal 1 (Week 1): STG = LTG due to ELOS  Skilled Therapeutic Interventions/Progress Updates:     Pt received supine in bed and agrees to therapy. Reports pain in L shoulder and R hip but had reported recently taking pain meds. PT provides rest breaks needed to manage pain. Supine to sit with bed features and cues for positioning at EOB and taking time to allow for vitals to stabilize once upright. Sit to stand and stand step transfer to [facility] with RW, CGA, and cues for sequencing and positioning. WC transport to gym for time management. Pt attempts ambulation from dayroom around RN station with RW and PT providing minA, with cues for increasing proximity to RW for safety, decreasing WB through RW for energy conservation, and maintaining upright posture for improved body mechanics. After ambulating 120', pt stops and verbalizes increasing fatigue and SOB. Pt's wife brings WC for unplanned seated rest break. Vitals monitored with O2 measuring at 99% on room air and HR variable due to chronic afib. PT educates on importance of safety awareness and knowing limitations. Following extended seated rest breaks, pt ambulates self selected distance, ~80', prior to requiring seated rest breaks. Again O2 measuring in upper 90s.   Pt transfers to Nustep and performs for strength and endurance training. Pt does not utilize L upper extremity due to chronic rotator cuff issues. Pt completes at workload of 6 and able to perform in 2:00 bouts with average steps per minute ~40. 8:00 active time on Nustep total. Pt performs stand step transfer back to [facility] with CGA. Left seated in WC with alarm intact and all needs within reach.  Therapy Documentation Precautions:   Precautions Precautions: Fall Restrictions Weight Bearing Restrictions: No    Therapy/Group: Individual Therapy  Breck Coons, PT, DPT 08/28/2021, 12:58 PM

## 2021-08-29 LAB — GLUCOSE, CAPILLARY
Glucose-Capillary: 102 mg/dL — ABNORMAL HIGH (ref 70–99)
Glucose-Capillary: 91 mg/dL (ref 70–99)
Glucose-Capillary: 103 mg/dL — ABNORMAL HIGH (ref 70–99)
Glucose-Capillary: 140 mg/dL — ABNORMAL HIGH (ref 70–99)

## 2021-08-30 ENCOUNTER — Other Ambulatory Visit (HOSPITAL_BASED_OUTPATIENT_CLINIC_OR_DEPARTMENT_OTHER): Payer: Self-pay

## 2021-08-30 LAB — BASIC METABOLIC PANEL
Anion gap: 11 (ref 5–15)
BUN: 16 mg/dL (ref 8–23)
CO2: 22 mmol/L (ref 22–32)
Calcium: 7.8 mg/dL — ABNORMAL LOW (ref 8.9–10.3)
Chloride: 105 mmol/L (ref 98–111)
Creatinine, Ser: 0.97 mg/dL (ref 0.61–1.24)
GFR, Estimated: >60 mL/min (ref >60)
Glucose, Bld: 136 mg/dL — ABNORMAL HIGH (ref 70–99)
Potassium: 3.6 mmol/L (ref 3.5–5.1)
Sodium: 138 mmol/L (ref 135–145)

## 2021-08-30 LAB — VITAMIN D 25 HYDROXY (VIT D DEFICIENCY, FRACTURES): Vit D, 25-Hydroxy: 45.34 ng/mL (ref 30–100)

## 2021-08-30 LAB — GLUCOSE, CAPILLARY
Glucose-Capillary: 117 mg/dL — ABNORMAL HIGH (ref 70–99)
Glucose-Capillary: 96 mg/dL (ref 70–99)

## 2021-08-30 MED ORDER — MAGNESIUM GLUCONATE 500 MG PO TABS
250.0000 mg | ORAL_TABLET | Freq: Every day | ORAL | 0 refills | Status: DC
  Filled 2021-08-30 – 2022-05-20 (×3): qty 30, 60d supply, fill #0

## 2021-08-30 MED ORDER — METOPROLOL TARTRATE 75 MG PO TABS
75.0000 mg | ORAL_TABLET | Freq: Two times a day (BID) | ORAL | 0 refills | Status: DC
Start: 2021-08-30 — End: 2021-09-29
  Filled 2021-08-30: qty 60, 30d supply, fill #0

## 2021-08-30 MED ORDER — TRAMADOL HCL 50 MG PO TABS: 50.0000 mg | ORAL_TABLET | Freq: Four times a day (QID) | ORAL | Status: DC | PRN

## 2021-08-30 MED ORDER — ACETAMINOPHEN 325 MG PO TABS: 650.0000 mg | ORAL_TABLET | Freq: Four times a day (QID) | ORAL | Status: DC | PRN

## 2021-08-30 NOTE — Discharge Summary (Signed)
Physician Discharge Summary  Patient ID: Shaun [name] MRN: [id number] DOB/AGE: 66-Jan-1957 66 y.o.  Admit date: 08/25/2021 Discharge date: 08/30/2021  Discharge Diagnoses:  Principal Problem:   Acute respiratory failure ([place]) Diastolic congestive heart failure Mood stabilization Hyperlipidemia Diabetes mellitus Atrial fibrillation COPD/tobacco abuse/OSA GERD  Discharged Condition: Stable  Significant Diagnostic Studies: DG CHEST PORT 1 VIEW  Result Date: 08/23/2021 CLINICAL DATA:  Shortness of breath. EXAM: PORTABLE CHEST 1 VIEW COMPARISON:  Chest radiograph dated 08/17/2021. FINDINGS: Enteric tube extends below the diaphragm with tip beyond the inferior margin of the image. Interval removal of the endotracheal tube and left IJ central venous line. Cardiomegaly with vascular congestion. Bibasilar atelectasis/scarring. No focal consolidation, pleural effusion, pneumothorax. No acute osseous pathology. IMPRESSION: Cardiomegaly with vascular congestion. No focal consolidation. Electronically Signed   By: Anner Crete M.D.   On: 08/23/2021 00:04   DG CHEST PORT 1 VIEW  Result Date: 08/17/2021 CLINICAL DATA:  ETT, respiratory failure with hypoxemia. EXAM: PORTABLE CHEST 1 VIEW COMPARISON:  Comparison is made with August 14, 2021. FINDINGS: LEFT IJ central venous line terminates at the proximal portion of the superior vena cava perhaps advanced slightly since the previous study. Endotracheal tube approximately 3.7 cm from the carina. Gastric tube courses through in off the field of the radiograph. EKG leads project over the chest. Cardiomediastinal contours and hilar structures are stable. No signs of lobar consolidative process or gross evidence of effusion. Subtle patchy LEFT basilar airspace disease. No visible pneumothorax. On limited assessment there is no acute skeletal process. IMPRESSION: LEFT IJ central venous line terminates at the proximal portion of the superior vena  cava. Subtle patchy LEFT basilar airspace disease, correlate with any signs of infection. This finding is unchanged since the recent exam. Electronically Signed   By: Zetta Bills M.D.   On: 08/17/2021 08:38   DG CHEST PORT 1 VIEW  Result Date: 08/14/2021 CLINICAL DATA:  Short of breath.  Follow-up exam. EXAM: PORTABLE CHEST 1 VIEW COMPARISON:  08/12/2021 and older exams. FINDINGS: Interstitial and mild patchy airspace opacities at the lung bases are without significant change from the most recent prior exam. No new lung abnormalities. Endotracheal tube and nasal/orogastric tube are stable. Left internal jugular central venous line is stable. No pneumothorax. IMPRESSION: 1. No significant interval change in lung base opacities when compared to the most recent prior study, and when allowing for differences in patient positioning and technique. No new lung abnormalities. 2. Stable support apparatus. Electronically Signed   By: Lajean Manes M.D.   On: 08/14/2021 09:04   DG Chest Port 1 View  Result Date: 08/12/2021 CLINICAL DATA:  Acute respiratory failure with hypoxemia. EXAM: PORTABLE CHEST 1 VIEW COMPARISON:  08/10/2021 FINDINGS: An endotracheal tube remains in place terminating proximally 5 cm above the carina. An enteric tube courses into the abdomen with tip not imaged. A left jugular catheter terminates over the upper SVC, unchanged. The patient is rotated to the right with persistent mild enlargement of the cardiac silhouette. Lung volumes remain low with mild central pulmonary vascular congestion. Patchy airspace opacities are present in the left greater than right lung bases with improved aeration of the lung bases compared to the prior study, particularly on the left. No sizable pleural effusion or pneumothorax is identified. IMPRESSION: Improved aeration of the lung bases. Electronically Signed   By: Logan Bores M.D.   On: 08/12/2021 09:06   DG CHEST PORT 1 VIEW  Result Date:  08/11/2021 CLINICAL DATA:  Hypoxia  and respiratory failure. EXAM: PORTABLE CHEST 1 VIEW COMPARISON:  Portable chest earlier today at 6:25 a.m. FINDINGS: 08/10/2021 at 11:44 p.m. the heart is enlarged. Central vessels are normal caliber. Emphysematous and chronic interstitial changes are present. Again noted is a small infiltrate in the right upper lobe apex, and hazy atelectasis or pneumonitis in the right base. On the current exam there is increasingly dense left lower lobe consolidation, and small increased left pleural effusion. No other new opacity is seen. ETT tip is 5 cm from the carina, NGT tip in the body of stomach based on the positioning of the proximal side hole with the tip not included, left IJ central line tip in the SVC. IMPRESSION: 1. Increasingly dense left lower lobe consolidation and small left pleural effusion. 2. Unchanged small infiltrate in the right apex, with hazy right basilar atelectasis or pneumonitis. 3. Cardiomegaly without findings of CHF. Electronically Signed   By: Telford Nab M.D.   On: 08/11/2021 00:17   DG CHEST PORT 1 VIEW  Result Date: 08/10/2021 CLINICAL DATA:  Difficulty breathing EXAM: PORTABLE CHEST 1 VIEW COMPARISON:  Previous studies including the examination is done on 08/09/2021 FINDINGS: Transverse diameter of heart is increased. Central pulmonary vessels are slightly more prominent. Interstitial markings in the left parahilar region and lower lung fields appear more prominent. There is no focal consolidation. There is no significant pleural effusion or pneumothorax. Tip of endotracheal is 5.1 cm above the carina. Tip of central venous catheter is seen in the superior vena cava. Enteric tube is noted traversing the esophagus. Distal portion of the enteric tube is seen in the stomach. Tip is not included in the radiograph. IMPRESSION: Cardiomegaly. Interstitial markings in the parahilar regions and lower lung fields appear more prominent. This may suggest mild  interstitial edema or interstitial pneumonitis or an artifact due to differences in the techniques. Electronically Signed   By: Elmer Picker M.D.   On: 08/10/2021 09:52   DG CHEST PORT 1 VIEW  Result Date: 08/09/2021 CLINICAL DATA:  Shortness of breath EXAM: PORTABLE CHEST 1 VIEW COMPARISON:  08/07/2021.  09/19/2018. FINDINGS: Chronic cardiomegaly and aortic atherosclerosis. Worsened venous hypertension, possibly with mild interstitial edema. No visible effusion. There could also be a bronchial thickening pattern consistent with bronchitis. IMPRESSION: Probable fluid overload/low level congestive heart failure. Cardiomegaly, venous hypertension and mild interstitial edema. Question also bronchial thickening that could go along with bronchitis. Electronically Signed   By: Nelson Chimes M.D.   On: 08/09/2021 13:17   Portable chest 1 View  Result Date: 08/07/2021 CLINICAL DATA:  66 year old male with respiratory failure.  Smoker. EXAM: PORTABLE CHEST 1 VIEW COMPARISON:  Portable chest 08/06/2021 and earlier. FINDINGS: Portable AP upright view at 0439 hours. Stable lung volumes and mediastinal contours. Borderline cardiomegaly. Stable lung markings. Allowing for portable technique the lungs are clear. No pneumothorax or pleural effusion. Visualized tracheal air column is within normal limits. No acute osseous abnormality identified. IMPRESSION: No acute cardiopulmonary abnormality. Electronically Signed   By: Genevie Ann M.D.   On: 08/07/2021 04:59   DG Chest Port 1 View  Result Date: 08/06/2021 CLINICAL DATA:  Shortness of breath EXAM: PORTABLE CHEST 1 VIEW COMPARISON:  09/21/2018 FINDINGS: Cardiomegaly. No confluent opacities, effusions or edema. No acute bony abnormality. IMPRESSION: Cardiomegaly.  No active disease. Electronically Signed   By: Rolm Baptise M.D.   On: 08/06/2021 19:45   DG Chest Port 1V same Day  Result Date: 08/09/2021 CLINICAL DATA:  Central line placement.  EXAM: PORTABLE CHEST  1 VIEW COMPARISON:  Chest radiograph dated 08/09/2021. FINDINGS: Endotracheal tube with tip approximately 4.5 cm above the carina. Enteric tube extends below the diaphragm with tip in the left upper abdomen, likely in the proximal stomach. Left IJ central venous line with tip over central SVC. Mild cardiomegaly with mild vascular congestion. Left lung base density may represent atelectasis. Developing infiltrate is not excluded. No large pleural effusion. No pneumothorax. No acute osseous pathology. Degenerative changes of spine. IMPRESSION: 1. Endotracheal tube above the carina. 2. Left IJ central venous line with tip over central SVC. No pneumothorax. Mild cardiomegaly and vascular congestion. Electronically Signed   By: Anner Crete M.D.   On: 08/09/2021 19:59   DG Chest Port 1V same Day  Result Date: 08/09/2021 CLINICAL DATA:  66 year old male with intubation EXAM: PORTABLE CHEST 1 VIEW COMPARISON:  08/09/2021, 08/07/2021 FINDINGS: Cardiomediastinal silhouette unchanged in size and contour. No evidence of central vascular congestion. No interlobular septal thickening. Interval placement of endotracheal tube, terminating suitably above the carina, 3.9 cm. Interval placement of gastric tube projecting over the mediastinum and terminating within the left upper quadrant Low lung volumes with no confluent airspace disease. Coarsened interstitial markings similar to the recent comparison chest x-rays. No pneumothorax or pleural effusion. No acute displaced fracture. Similar appearance lytic changes at the distal right clavicle IMPRESSION: Interval placement of endotracheal tube, terminating suitably above the carina. Gastric tube terminates in the stomach. Low lung volumes with likely atelectasis at the bases Electronically Signed   By: Corrie Mckusick D.O.   On: 08/09/2021 16:28   DG Abd Portable 1V  Result Date: 08/23/2021 CLINICAL DATA:  Abdominal pain EXAM: PORTABLE ABDOMEN - 1 VIEW COMPARISON:   08/21/2021 FINDINGS: Enteric tube terminates in the distal gastric antrum/pyloric region. Nonobstructive bowel gas pattern. Degenerative changes of the right hip. IMPRESSION: Enteric tube terminates in the distal gastric antrum/pyloric region. Electronically Signed   By: Julian Hy M.D.   On: 08/23/2021 05:36   DG Abd Portable 1V  Result Date: 08/21/2021 CLINICAL DATA:  Nasogastric placement EXAM: PORTABLE ABDOMEN - 1 VIEW COMPARISON:  08/19/2021 FINDINGS: Nasogastric tube enters the stomach with its tip in the fundus. Gas pattern unremarkable. IMPRESSION: Nasogastric tube in the fundus of the stomach. Electronically Signed   By: Nelson Chimes M.D.   On: 08/21/2021 16:11   DG Abd Portable 1V  Result Date: 08/19/2021 CLINICAL DATA:  OG tube placement. EXAM: PORTABLE ABDOMEN - 1 VIEW COMPARISON:  None. FINDINGS: OG tube tip is in the mid stomach with proximal side port just above the GE junction. Bowel gas pattern is nonspecific. IMPRESSION: OG tube tip is in the mid stomach with proximal side port just above the GE junction. Tube could be advanced 4-5 cm to place the proximal side port below the GE junction. Electronically Signed   By: Misty Stanley M.D.   On: 08/19/2021 06:37   DG Swallowing Func-Speech Pathology  Result Date: 08/24/2021 Table formatting from the original result was not included. Objective Swallowing Evaluation: Type of Study: MBS-Modified Barium Swallow Study  Patient Details Name: GILMAN OLAZABAL MRN: [id number] Date of Birth: June 12, 1956 Today's Date: 08/24/2021 Time: SLP Start Time (ACUTE ONLY): 0845 -SLP Stop Time (ACUTE ONLY): 0915 SLP Time Calculation (min) (ACUTE ONLY): 30 min Past Medical History: Past Medical History: Diagnosis Date  Anemia   Anxiety   Asthma   CAD (coronary artery disease)   DES to distal circumflex 2016  Cataract   Colon polyps  30 colon polyps found on first colonoscopy  Diastolic heart failure (HCC)   Diverticulitis   DJD (degenerative joint  disease)   GERD (gastroesophageal reflux disease)   History of kidney stones   Hyperlipidemia   Hypertension   Insomnia   Obstructive sleep apnea 12/2009  01/26/2010 AHI 83/hr  Permanent atrial fibrillation ([place])   Onset 2006 paroxysmal then progressive to persistent  PUD (peptic ulcer disease)   1980s  RLS (restless legs syndrome)   Sinusitis   Skin cancer   Type 2 diabetes mellitus ([place])  Past Surgical History: Past Surgical History: Procedure Laterality Date  BIOPSY  07/17/2018  Procedure: BIOPSY;  Surgeon: Danie Binder, MD;  [facility];  Service: Endoscopy;;  colon  BOWEL RESECTION  09/17/2018  SMALL BOWEL RESECTION: 71 CM   CARDIAC CATHETERIZATION N/A 07/21/2015  Procedure: Left Heart Cath and Coronary Angiography;  Surgeon: Peter M Martinique, MD;  Location: [facility];  Service: Cardiovascular;  Laterality: N/A;  CARDIAC CATHETERIZATION N/A 07/21/2015  Procedure: Coronary Stent Intervention;  Surgeon: Peter M Martinique, MD;  Location: [place];  Service: Cardiovascular;  Laterality: N/A;  CIRCUMCISION N/A 04/05/2019  Procedure: CIRCUMCISION ADULT;  Surgeon: Irine Seal, MD;  Location: [facility];  Service: Urology;  Laterality: N/A;  COLONOSCOPY N/A 05/19/2014  Dr. Barnie Alderman diverticulosis/moderate external hemorrhoids, >20 simple adenomas. Genetic screening negative.   COLONOSCOPY WITH PROPOFOL N/A 07/17/2018  Dr. Oneida Alar: Diverticulosis, external/internal hemorrhoids, 32 colon polyps removed.  ten tubular adenomas removed with no high-grade dysplasia.  Advised to have surveillance colonoscopy in 3 years.  COLONOSCOPY WITH PROPOFOL N/A 06/07/2021  Procedure: COLONOSCOPY WITH PROPOFOL;  Surgeon: Eloise Harman, DO;  [facility];  Service: Endoscopy;  Laterality: N/A;  9:30 / ASA 3  (Pt was told that his time will be given at Pre-op)  ESOPHAGOGASTRODUODENOSCOPY (EGD) WITH PROPOFOL N/A 07/17/2018  Dr. Oneida Alar: Low-grade narrowing Schatzki ring at the GE junction status post  dilation.  Gastritis.  Biopsy with mild nonspecific reactive gastropathy.  No H. pylori.  GIVENS CAPSULE STUDY N/A 06/24/2019  normal  HERNIA REPAIR  1986  Left inguinal  INTRAVASCULAR PRESSURE WIRE/FFR STUDY Left 06/08/2017  Procedure: INTRAVASCULAR PRESSURE WIRE/FFR STUDY;  Surgeon: Nelva Bush, MD;  Location: [facility];  Service: Cardiovascular;  Laterality: Left;  LAD and CFX  LAPAROTOMY N/A 09/17/2018  Procedure: EXPLORATORY LAPAROTOMY;  Surgeon: Virl Cagey, MD;  Location: [facility];  Service: General;  Laterality: N/A;  LEFT HEART CATH AND CORONARY ANGIOGRAPHY N/A 06/08/2017  Procedure: LEFT HEART CATH AND CORONARY ANGIOGRAPHY;  Surgeon: Nelva Bush, MD;  Location: [facility];  Service: Cardiovascular;  Laterality: N/A;  POLYPECTOMY  07/17/2018  Procedure: POLYPECTOMY;  Surgeon: Danie Binder, MD;  [facility];  Service: Endoscopy;;  colon  POLYPECTOMY  06/07/2021  Procedure: POLYPECTOMY INTESTINAL;  Surgeon: Eloise Harman, DO;  [facility];  Service: Endoscopy;;  ROTATOR CUFF REPAIR    Right  SAVORY DILATION N/A 07/17/2018  Procedure: SAVORY DILATION;  Surgeon: Danie Binder, MD;  [facility];  Service: Endoscopy;  Laterality: N/A; HPI: 66 y/o male presented to ED 12/9 with flu/fever/dyspnea; desaturated 12/12 requiring bipap then intubation; extubated 12/22.  PMHx of active smoker, anxiety, CAD, permanent afib,  COPD, DMII, HTN, HHLD, OSA. Nov 2019 EGD with dilation of low grade Schatzi's ring.  Subjective: pt alert, pleasant  Recommendations for follow up therapy are one component of a multi-disciplinary discharge  planning process, led by the attending physician.  Recommendations may be updated based on patient status, additional functional criteria and insurance authorization. Assessment / Plan / Recommendation Clinical Impressions 08/24/2021 Clinical Impression Pt demonstrates significant improvement in swallowing. Only mild  pharyngeal weakness resuling in trace high vestibular penetration and mild vallecular residue without pt awareness. He was primarily in a chin tuck position during test, but it did not appear particularly beneficial. Pt cued to take dry swallows and use a liquid wash, which was helpful. Pt recommended to consume a regular diet and thin liquids with basic precautions. SLP Visit Diagnosis Dysphagia, pharyngeal phase (R13.13) Attention and concentration deficit following -- Frontal lobe and executive function deficit following -- Impact on safety and function Mild aspiration risk   Treatment Recommendations 08/24/2021 Treatment Recommendations Therapy as outlined in treatment plan below   Prognosis 08/20/2021 Prognosis for Safe Diet Advancement Good Barriers to Reach Goals -- Barriers/Prognosis Comment -- Diet Recommendations 08/24/2021 SLP Diet Recommendations Regular solids;Thin liquid Liquid Administration via Cup;Straw Medication Administration Whole meds with liquid Compensations Slow rate;Small sips/bites;Multiple dry swallows after each bite/sip;Hard cough after swallow Postural Changes --   Other Recommendations 08/24/2021 Recommended Consults -- Oral Care Recommendations Oral care BID Other Recommendations -- Follow Up Recommendations Acute inpatient rehab (3hours/day) Assistance recommended at discharge Intermittent Supervision/Assistance Functional Status Assessment Patient has had a recent decline in their functional status and demonstrates the ability to make significant improvements in function in a reasonable and predictable amount of time. Frequency and Duration  08/24/2021 Speech Therapy Frequency (ACUTE ONLY) min 2x/week Treatment Duration 2 weeks   Oral Phase 08/24/2021 Oral Phase WFL Oral - Pudding Teaspoon -- Oral - Pudding Cup -- Oral - Honey Teaspoon -- Oral - Honey Cup -- Oral - Nectar Teaspoon -- Oral - Nectar Cup -- Oral - Nectar Straw -- Oral - Thin Teaspoon -- Oral - Thin Cup -- Oral - Thin  Straw -- Oral - Puree -- Oral - Mech Soft -- Oral - Regular -- Oral - Multi-Consistency -- Oral - Pill -- Oral Phase - Comment --  Pharyngeal Phase 08/24/2021 Pharyngeal Phase Impaired Pharyngeal- Pudding Teaspoon -- Pharyngeal -- Pharyngeal- Pudding Cup -- Pharyngeal -- Pharyngeal- Honey Teaspoon NT Pharyngeal -- Pharyngeal- Honey Cup NT Pharyngeal -- Pharyngeal- Nectar Teaspoon NT Pharyngeal -- Pharyngeal- Nectar Cup Reduced tongue base retraction;Reduced epiglottic inversion;Pharyngeal residue - valleculae Pharyngeal Material does not enter airway Pharyngeal- Nectar Straw -- Pharyngeal -- Pharyngeal- Thin Teaspoon -- Pharyngeal -- Pharyngeal- Thin Cup Reduced epiglottic inversion;Reduced tongue base retraction;Penetration/Aspiration during swallow;Pharyngeal residue - valleculae Pharyngeal Material enters airway, remains ABOVE vocal cords and not ejected out;Material does not enter airway Pharyngeal- Thin Straw Reduced epiglottic inversion;Reduced tongue base retraction;Penetration/Aspiration during swallow;Pharyngeal residue - valleculae Pharyngeal Material enters airway, remains ABOVE vocal cords and not ejected out;Material does not enter airway Pharyngeal- Puree Reduced tongue base retraction;Reduced epiglottic inversion;Pharyngeal residue - valleculae Pharyngeal Material does not enter airway Pharyngeal- Mechanical Soft -- Pharyngeal -- Pharyngeal- Regular Reduced epiglottic inversion;Reduced tongue base retraction;Pharyngeal residue - valleculae Pharyngeal -- Pharyngeal- Multi-consistency -- Pharyngeal -- Pharyngeal- Pill Pharyngeal residue - pyriform;Reduced tongue base retraction Pharyngeal -- Pharyngeal Comment --  Cervical Esophageal Phase  08/20/2021 Cervical Esophageal Phase WFL Pudding Teaspoon -- Pudding Cup -- Honey Teaspoon -- Honey Cup -- Nectar Teaspoon -- Nectar Cup -- Nectar Straw -- Thin Teaspoon -- Thin Cup -- Thin Straw -- Puree -- Mechanical Soft -- Regular -- Multi-consistency -- Pill --  Cervical Esophageal Comment -- Herbie Baltimore, MA CCC-SLP Acute  Rehabilitation Services Office [phone] Lynann Beaver 08/24/2021, 10:10 AM                     ECHOCARDIOGRAM COMPLETE  Result Date: 08/10/2021    ECHOCARDIOGRAM REPORT   Patient Name:   NATHAN MOCTEZUMA Date of Exam: 08/10/2021 Medical Rec #:  [id number]          Height:       68.0 in Accession #:    [id number]         Weight:       202.8 lb Date of Birth:  [birth year]         BSA:          2.056 m Patient Age:    62 years           BP:           106/68 mmHg Patient Gender: M                  HR:           97 bpm. Exam Location:  [facility] Procedure: 2D Echo, Cardiac Doppler and Color Doppler Indications:    Atrial Fibrillation I48.91  History:        Patient has prior history of Echocardiogram examinations, most                 recent 04/08/2019. CHF, CAD; Risk Factors:Hypertension and                 Dyslipidemia. Sleep Apnea.  Sonographer:    Alvino Chapel RCS Referring Phys: [place] Comments: Patient on mechanical ventilator at time of echo exam. IMPRESSIONS  1. Left ventricular ejection fraction, by estimation, is 60 to 65%. The left ventricle has normal function. The left ventricle has no regional wall motion abnormalities. There is moderate concentric left ventricular hypertrophy. Left ventricular diastolic parameters are indeterminate.  2. Right ventricular systolic function is normal. The right ventricular size is normal. Tricuspid regurgitation signal is inadequate for assessing PA pressure.  3. Left atrial size was mildly dilated.  4. The mitral valve is grossly normal. Trivial mitral valve regurgitation.  5. The aortic valve is tricuspid. Aortic valve regurgitation is not visualized.  6. Unable to estimate CVP. Comparison(s): Prior images reviewed side by side. LVEF stable at 60-65%. FINDINGS  Left Ventricle: Left ventricular ejection fraction, by estimation, is 60 to 65%. The left ventricle  has normal function. The left ventricle has no regional wall motion abnormalities. The left ventricular internal cavity size was normal in size. There is  moderate concentric left ventricular hypertrophy. Left ventricular diastolic function could not be evaluated due to atrial fibrillation. Left ventricular diastolic parameters are indeterminate. Right Ventricle: The right ventricular size is normal. No increase in right ventricular wall thickness. Right ventricular systolic function is normal. Tricuspid regurgitation signal is inadequate for assessing PA pressure. Left Atrium: Left atrial size was mildly dilated. Right Atrium: Right atrial size was normal in size. Pericardium: There is no evidence of pericardial effusion. Presence of epicardial fat layer. Mitral Valve: The mitral valve is grossly normal. Trivial mitral valve regurgitation. Tricuspid Valve: The tricuspid valve is grossly normal. Tricuspid valve regurgitation is trivial. Aortic Valve: The aortic valve is tricuspid. There is mild to moderate aortic valve annular calcification. Aortic valve regurgitation is not visualized. Pulmonic Valve: The pulmonic valve was not well visualized. Pulmonic valve regurgitation is trivial. Aorta: The aortic root is normal  in size and structure. Venous: Unable to estimate CVP. IVC assessment for right atrial pressure unable to be performed due to mechanical ventilation. IAS/Shunts: No atrial level shunt detected by color flow Doppler.  LEFT VENTRICLE PLAX 2D LVIDd:         4.60 cm LVIDs:         2.90 cm LV PW:         1.30 cm LV IVS:        1.40 cm LVOT diam:     1.90 cm LV SV:         46 LV SV Index:   22 LVOT Area:     2.84 cm  RIGHT VENTRICLE RV S prime:     10.70 cm/s TAPSE (M-mode): 1.8 cm LEFT ATRIUM             Index        RIGHT ATRIUM           Index LA diam:        4.10 cm 1.99 cm/m   RA Area:     18.50 cm LA Vol (A2C):   84.4 ml 41.05 ml/m  RA Volume:   52.00 ml  25.29 ml/m LA Vol (A4C):   75.7 ml 36.82  ml/m LA Biplane Vol: 79.8 ml 38.81 ml/m  AORTIC VALVE LVOT Vmax:   91.70 cm/s LVOT Vmean:  58.300 cm/s LVOT VTI:    0.161 m  AORTA Ao Root diam: 3.30 cm MITRAL VALVE MV Area (PHT): 4.15 cm    SHUNTS MV Decel Time: 183 msec    Systemic VTI:  0.16 m MV E velocity: 86.40 cm/s  Systemic Diam: 1.90 cm Rozann Lesches MD Electronically signed by Rozann Lesches MD Signature Date/Time: 08/10/2021/8:24:44 PM    Final     Labs:  Basic Metabolic Panel: Recent Labs  Lab 08/24/21 0055 08/26/21 0526 08/30/21 0738  NA 136 139 138  K 4.0 3.4* 3.6  CL 104 107 105  CO2 24 25 22   GLUCOSE 127* 140* 136*  BUN 22 19 16   CREATININE 0.73 0.81 0.97  CALCIUM 7.4* 7.7* 7.8*    CBC: Recent Labs  Lab 08/26/21 0526  WBC 5.2  NEUTROABS 3.0  HGB 11.9*  HCT 36.4*  MCV 86.1  PLT 150    CBG: Recent Labs  Lab 08/29/21 1146 08/29/21 1642 08/29/21 2102 08/30/21 0549 08/30/21 1134  GLUCAP 91 103* 140* 117* 96   Family history.  Mother with hypertension as well as breast cancer.  Sister with skin cancer Brother with diabetes and Parkinson's disease.  Negative for colon cancer esophageal cancer or rectal cancer  Brief HPI:   LUCAH PETTA is a 66 y.o. right-handed male with history of anxiety diastolic congestive heart failure diabetes mellitus hyperlipidemia hypertension obstructive sleep apnea/CPAP, PAF, COPD with tobacco abuse, atrial fibrillation maintained on Eliquis.  Per chart review lives with spouse.  Modified independent prior to admission.  Presented 08/06/2021 to [facility] with recent diagnosis of flu.  He was given steroid and Tessalon Perles per report.  He spiked a fever to 102 with minimal relief.  He did have a cough occasionally productive of green sputum.  He did have some chest tightness and mild shortness of breath.  Patient had been vaccinated for COVID but not flu.  He was placed on Tamiflu in the ED heart rate 101-125 oxygen saturation 96% on 3 L no leukocytosis.  Chest  x-ray no active disease.  Patient did require intubation  for a short time.  He was transferred to [facility].  His Tamiflu and since been completed.  Hospital course complicated by atrial fibrillation requiring intravenous amiodarone.  Echocardiogram with ejection fraction of 60 to 65% no wall motion abnormalities.  Initial nasogastric tube for nutritional support diet advanced to regular.  Therapy evaluations completed due to patient decreased functional mobility was admitted for a comprehensive rehab program.   Hospital Course: GUILFORD SHANNAHAN was admitted to rehab 08/25/2021 for inpatient therapies to consist of PT, ST and OT at least three hours five days a week. Past admission physiatrist, therapy team and rehab RN have worked together to provide customized collaborative inpatient rehab.  Pertain to patient's debility related to acute respiratory failure/COPD.  Patient attending therapies with progressive gains working with energy and conservation endurance.  Mood stabilization with use of Lexapro as well as Abilify he was attending full therapies.  Emotional support provided.  Diastolic congestive heart failure Lasix as directed monitor for any signs of fluid overload.  Lipitor as well as Zetia ongoing for hyperlipidemia.  Blood sugars controlled hemoglobin A1c 6.9 diabetic diet as well as maintained on metformin.  History of atrial fibrillation maintained on Eliquis as well as Lopressor follow-up cardiology services no chest pain or shortness of breath.  Noted history of COPD tobacco abuse patient did receive counts regards to cessation of nicotine products.   Blood pressures were monitored on TID basis and controlled  Diabetes has been monitored with ac/hs CBG checks and SSI was use prn for tighter BS control.    Rehab course: During patient's stay in rehab weekly team conferences were held to monitor patient's progress, set goals and discuss barriers to discharge. At admission,  patient required total assist squat pivot transfers moderate assist side-lying to sitting minimal assist supine to sit  Physical exam.  Blood pressure 99/70 pulse 91 temperature 98.1 respirations 20 oxygen saturations 92% room air Constitutional.  No acute distress HEENT Head.  Normocephalic and atraumatic Eyes.  Pupils round and reactive to light no discharge without nystagmus Neck.  Supple nontender no JVD without thyromegaly Cardiac irregular irregular rate controlled Abdomen.  Soft nontender positive bowel sounds without rebound Respiratory effort normal no respiratory distress without wheeze Skin.  Intact Neurologic.  Alert oriented x3 follows commands  He/She  has had improvement in activity tolerance, balance, postural control as well as ability to compensate for deficits. He/She has had improvement in functional use RUE/LUE  and RLE/LLE as well as improvement in awareness.  Working with activity tolerance showing improved balance discharged amatory level modified independent supervision with rolling walker.  He can gather his belongings for activities day living homemaking.  Full family teaching completed plan discharged to home       Disposition: Discharged home   Diet: Diabetic diet  Special Instructions: No driving smoking or alcohol  Medications at discharge 1.  Tylenol as needed 2.  Albuterol inhaler 2 puffs every 6 hours as needed 3.  Abilify 2 mg daily 4.  Lipitor 80 mg daily 5.  Colace 100 mg daily as needed constipation 6.  Eliquis 5 mg p.o. twice daily 7.  Lexapro 20 mg p.o. daily 8.  Zetia 10 mg p.o. daily 9.  Ferrous sulfate 3 and 25 mg p.o. daily 10.  Lasix 40 mg daily 11.  Magnesium gluconate 250 mg nightly 12.  Metformin 500 mg p.o. twice daily 13.  Metoprolol 75 mg p.o. twice daily 14.  Nitroglycerin as needed chest pain 15.  Prilosec 40 mg daily 16.  Potassium chloride 20 mg p.o. daily 17.  Requip 3 mg p.o. nightly 18.  Carafate 1 g twice daily as  needed 19.  Tramadol 50 mg every 6 hours as needed pain 20.  Trazodone 100 mg nightly  30-35 minutes were spent completing discharge summary and discharge planning  Allergies as of 08/30/2021   No Known Allergies      Medication List     STOP taking these medications    benzonatate 100 MG capsule Commonly known as: Tessalon Perles   meclizine 25 MG tablet Commonly known as: ANTIVERT   ondansetron 4 MG tablet Commonly known as: ZOFRAN       TAKE these medications    acetaminophen 325 MG tablet Commonly known as: TYLENOL Take 2 tablets (650 mg total) by mouth every 6 (six) hours as needed for mild pain.   albuterol 108 (90 Base) MCG/ACT inhaler Commonly known as: Ventolin HFA INHALE 2 PUFFS INTO THE LUNGS EVERY 6 HOURS AS NEEDED FOR WHEEZING OR SHORTNESS OF BREATH   ARIPiprazole 2 MG tablet Commonly known as: Abilify Take 1 tablet (2 mg total) by mouth daily.   atorvastatin 80 MG tablet Commonly known as: LIPITOR Take 1 tablet (80 mg total) by mouth once daily in the evening.  Please follow up in 2-3 months. (Take 1 tablet (80 mg total) by mouth daily. TAKE 1 TABLET(80 MG) BY MOUTH EVERY EVENING)   docusate sodium 100 MG capsule Commonly known as: COLACE Take 100 mg by mouth daily as needed for mild constipation.   Eliquis 5 MG Tabs tablet Generic drug: apixaban TAKE 1 TABLET (5 MG) BY MOUTH TWICE DAILY   escitalopram 20 MG tablet Commonly known as: LEXAPRO TAKE 1 TABLET(20 MG) BY MOUTH DAILY   ezetimibe 10 MG tablet Commonly known as: ZETIA TAKE 1 TABLET BY MOUTH EVERY DAY   ferrous sulfate 325 (65 FE) MG tablet Take 325 mg by mouth daily with breakfast.   furosemide 40 MG tablet Commonly known as: LASIX Take 1 tablet (40 mg total) by mouth daily.   magnesium gluconate 500 MG tablet Commonly known as: MAGONATE Take 0.5 tablets (250 mg total) by mouth at bedtime.   metFORMIN 500 MG tablet Commonly known as: GLUCOPHAGE TAKE 1 TABLET (500 MG) BY  MOUTH TWICE DAILY WITH A MEAL.   Metoprolol Tartrate 75 MG Tabs Take 75 mg by mouth 2 (two) times daily. What changed:  medication strength how much to take   nitroGLYCERIN 0.4 MG SL tablet Commonly known as: NITROSTAT Place 1 tablet (0.4 mg total) under the tongue every 5 (five) minutes as needed.   omeprazole 40 MG capsule Commonly known as: PRILOSEC Take 1 capsule by mouth once daily 30 minutes before breakfast   potassium chloride SA 20 MEQ tablet Commonly known as: KLOR-CON M Take 1 tablet (20 mEq total) by mouth daily.   rOPINIRole 3 MG tablet Commonly known as: REQUIP TAKE 1 TABLET BY MOUTH EVERY NIGHT AT BEDTIME   sucralfate 1 g tablet Commonly known as: CARAFATE Take one tablet po BID PRN What changed:  how much to take how to take this when to take this reasons to take this additional instructions   traMADol 50 MG tablet Commonly known as: ULTRAM Take 1 tablet (50 mg total) by mouth every 6 (six) hours as needed for moderate pain or severe pain. You should have 20 day supply of medication at home.   traZODone 50 MG tablet Commonly known  as: DESYREL TAKE 2 TABLETS (100 MG) BY MOUTH AT BEDTIME        Follow-up Information     Meredith Staggers, MD Follow up.   Specialty: Physical Medicine and Rehabilitation Why: Was seen by Dr.Raulker. Keep follow up appointment with Dr. Aretta Nip. Contact information: [address] [place] [address] [phone]         Loel Dubonnet, NP Follow up on 09/23/2021.   Specialty: Cardiology Why: Keep appointment Contact information: [address] [place] [address] [phone]         Maximiano Coss, NP. Call.   Specialty: Adult Health Nurse Practitioner Why: for post hospital follow up Contact information: [address] [place] [address] [phone]         Lelon Perla, MD .   Specialty: Cardiology Contact information: [address] [place] [address] [phone]                 Signed: Lavon Paganini [place] 08/30/2021, 2:46 PM

## 2021-08-30 NOTE — Progress Notes (Signed)
Physical Therapy Session Note  Patient Details  Name: Shaun [name] MRN: [id number] Date of Birth: [birth year]  Today's Date: 08/30/2021 PT Individual Time: 0800-0924 PT Individual Time Calculation (min): 84 min   Short Term Goals: Week 1:  PT Short Term Goal 1 (Week 1): STG = LTG due to ELOS  Skilled Therapeutic Interventions/Progress Updates:  Pt received supine in bed, wife present. Pt reported 5/10 pain in R hip and was premedicated. Offered repositioning, light stretch and distraction throughout session for pain modulation. Emphasis of session on family training w/stairs and preparation for DC home. Pt and wife requested to go home today, care team notified. Pt performed bed mobility mod I and donned shoes at EOB w/total A and help of wife. Pt performed sit <>stands throughout session mod I with and without RW. Pt ambulated 100' from room to ortho gym w/RW and S*, noted kyphotic posture, decreased step length of LLE, decreased step clearance of RLE, antalgic pattern and downward gaze. Min verbal cues to maintain proper distance to RW to facilitate upright posture and reduce stress on LUE and R hip. Pt required long seated rest breaks throughout session due to fatigue, reported this is baseline for him. Vitals obtained while seated after gait: HR: 68-83 bpm, SPO2 97% on RA.   Pt performed car transfer w/RW and S*, min verbal cues for proper hand positioning to assist in pulling into car as pt will be riding in [place] w/a tall seat height. Pt demonstrated no difficulty swinging legs into/out of car. Pt then transported to main gym w/total A in [facility] for energy conservation.    Stair training: Pt ascended/descended 4 6" steps w/BUE support on rails, step-to pattern and CGA. Noted decreased step clearance w/RLE. Provided visual demonstration of proper BLE sequencing while ascending/descending, which pt able to demonstrate and teach back while performing task. Attempted to ascend/descend 2 6"  steps w/HHA on RUE, as pt will not have rails at home. Pt reported significant unsteadiness and exhibited fear-avoidance behavior due to fear that R hip would "give out" on him. Spent majority of session educating pt and his wife on strategies to ascend/descend their two steps at home. Pt able to demonstrate ascending first step using retro technique w/RW and turning 180 degrees around on step to reach for doorway to brace and complete second step into house w/CGA. Pt practiced technique w/wife x2 and both wife and pt reported contempt w/strategy and confidence w/performing at home.   Pt transported back to room w/total A for time management and was provided HEP (see below). Lengthy discussion regarding importance of positioning of R hip and proper use of RW to promote upright positioning and reduced stress on LUE and R hip, pt and wife verbalized understanding. Pt was left seated in WC in room, all needs in reach.   Access Code: [email or web address] URL: [email or web address] Date: 08/30/2021 Prepared by: Mickie Bail Zadkiel Dragan  Exercises  Lying on your stomach - 1 x daily - 7 x weekly Seated knee extension - 1 x daily - 7 x weekly - 3 sets - 10 reps Seated March - 1 x daily - 7 x weekly - 3 sets - 10 reps Seated march with opposite arm raise - 1 x daily - 7 x weekly - 3 sets - 10 reps Seated Hip Abduction with Resistance - 1 x daily - 7 x weekly - 3 sets - 10 reps Seated Hip Adduction Squeeze with Ball - 1 x daily - 7 x weekly - 3  sets - 10 reps Supine Bridge - 1 x daily - 7 x weekly - 3 sets - 10 reps Standing Hip Abduction with Counter Support - 1 x daily - 7 x weekly - 3 sets - 10 reps  Therapy Documentation Precautions:  Precautions Precautions: Fall Restrictions Weight Bearing Restrictions: No   Therapy/Group: Individual Therapy Cruzita Lederer Keeva Reisen, PT, DPT  08/30/2021, 7:43 AM

## 2021-08-30 NOTE — Progress Notes (Signed)
PROGRESS NOTE   Subjective/Complaints:  LBM yesterday.  Pt using CPAP- no issues- ready to get home.     ROS:  Pt denies SOB, abd pain, CP, N/V/C/D, and vision changes   Objective:   No results found. No results for input(s): WBC, HGB, HCT, PLT in the last 72 hours.  Recent Labs    08/30/21 0738  NA 138  K 3.6  CL 105  CO2 22  GLUCOSE 136*  BUN 16  CREATININE 0.97  CALCIUM 7.8*    Intake/Output Summary (Last 24 hours) at 08/30/2021 0853 Last data filed at 08/30/2021 0819 Gross per 24 hour  Intake 600 ml  Output 1300 ml  Net -700 ml        Physical Exam: Vital Signs Blood pressure 126/80, pulse 73, temperature 97.8 F (36.6 C), resp. rate 17, height 5\' 7"  (1.702 m), weight 89.9 kg, SpO2 96 %.   General: awake, alert, appropriate, asleep; but woke easily- wearing CPAP; NAD HENT: conjugate gaze; oropharynx moist CV: regular rate; no JVD Pulmonary: CTA B/L; no W/R/R- good air movement- sounds good GI: soft, NT, ND, (+)BS Psychiatric: appropriate Neurological: Ox3  Skin: no breakdown Neuro:  Alert and oriented x 3. Normal insight and awareness. Mild STM deficits?. Normal language and speech. Cranial nerve exam unremarkable. UE motor 5/5. LE 4-/5 prox to 5-/5 distally. No sensory deficits.normal cerebellar exam Musculoskeletal: right hip pain with AROM/PROM.     Assessment/Plan: 1. Functional deficits which require 3+ hours per day of interdisciplinary therapy in a comprehensive inpatient rehab setting. Physiatrist is providing close team supervision and 24 hour management of active medical problems listed below. Physiatrist and rehab team continue to assess barriers to discharge/monitor patient progress toward functional and medical goals  Care Tool:  Bathing    Body parts bathed by patient: Chest, Left arm, Right arm, Abdomen, Front perineal area, Buttocks, Right upper leg, Left upper leg, Face,  Left lower leg, Right lower leg         Bathing assist Assist Level: Contact Guard/Touching assist     Upper Body Dressing/Undressing Upper body dressing   What is the patient wearing?: Pull over shirt    Upper body assist Assist Level: Contact Guard/Touching assist    Lower Body Dressing/Undressing Lower body dressing      What is the patient wearing?: Underwear/pull up, Pants     Lower body assist Assist for lower body dressing: Minimal Assistance - Patient > 75%     Toileting Toileting    Toileting assist Assist for toileting: Minimal Assistance - Patient > 75%     Transfers Chair/bed transfer  Transfers assist     Chair/bed transfer assist level: Contact Guard/Touching assist     Locomotion Ambulation   Ambulation assist      Assist level: Contact Guard/Touching assist Assistive device: Walker-rolling Max distance: 53ft   Walk 10 feet activity   Assist     Assist level: Contact Guard/Touching assist Assistive device: Walker-rolling   Walk 50 feet activity   Assist Walk 50 feet with 2 turns activity did not occur: Safety/medical concerns (fatigue and R hip pain)         Walk  150 feet activity   Assist Walk 150 feet activity did not occur: Safety/medical concerns         Walk 10 feet on uneven surface  activity   Assist Walk 10 feet on uneven surfaces activity did not occur: Safety/medical concerns         Wheelchair     Assist Is the patient using a wheelchair?: No             Wheelchair 50 feet with 2 turns activity    Assist            Wheelchair 150 feet activity     Assist          Blood pressure 126/80, pulse 73, temperature 97.8 F (36.6 C), resp. rate 17, height 5\' 7"  (1.702 m), weight 89.9 kg, SpO2 96 %.  Medical Problem List and Plan: 1.  Debility functional deficits secondary to acute respiratory failure/COPD/OSA             -patient may shower             -ELOS/Goals: 6 days/  MinA  Continue CIR therapies including PT, OT   Will check in D/C date- con't PT and OT- still very fatigued after 120 ft and SOB.  2.  Impaired mobility: discussed home therapy 3. Pain Management: Tylenol as needed  -add tramadol prn per previous routine 4. Depression: Continue Lexapro 20 mg daily             -antipsychotic agents: Abilify 2 mg daily 5. Neuropsych: This patient is capable of making decisions on his own behalf. 6. Skin/Wound Care: Routine skin checks 7. Fluids/Electrolytes/Nutrition: encourage po  -K+ 3.4--> supplementing  -recheck bmet Monday  1/2- K+ 3.6- will be OK 8.  Diastolic congestive heart failure.  Lasix 20 mg twice daily.  Monitor for any signs of fluid overload. Check vitamin D level on Monday   Filed Weights   08/28/21 0500 08/29/21 0500 08/30/21 0550  Weight: 89.5 kg 89.7 kg 89.9 kg    1/2- weight stable- con't regimen 9.  Hyperlipidemia Lipitor/Zetia 10.  Diabetes mellitus.  Hemoglobin A1c 6.9.  SSI 1/2- 91-140- con't regimen well controlled 11.  Atrial fibrillation.  Continue Eliquis.  Lopressor 100 mg twice daily.  Cardiac rate controlled.  Follow-up cardiology service. Magnesium level 1.8 on 12/26. Supplement started  -HR regular, controlled at present,  12.  COPD/tobacco use/OSA.  Continue CPAP.  Provide counsel regarding cessation of nicotine products. 13.  GERD.  Continue Protonix 14. Insomnia: improved with scheduled trazodone  1/2- sleeping well- con't regimen    LOS: 5 days A FACE TO FACE EVALUATION WAS PERFORMED  Nakea Gouger 08/30/2021, 8:53 AM

## 2021-08-30 NOTE — Discharge Instructions (Addendum)
Inpatient Rehab Discharge Instructions  Shaun [name] Discharge date and time: 08/30/21   Activities/Precautions/ Functional Status: Activity: no lifting, driving, or strenuous exercise till cleared by MD Diet: regular diet Wound Care: none needed  Functional status:  ___ No restrictions     ___ Walk up steps independently _X__ 24/7 supervision/assistance   ___ Walk up steps with assistance ___ Intermittent supervision/assistance  ___ Bathe/dress independently ___ Walk with walker     _X__ Bathe/dress with assistance ___ Walk Independently    ___ Shower independently ___ Walk with assistance    ___ Shower with assistance __X_ No alcohol     ___ Return to work/school ________   Special Instructions:  COMMUNITY REFERRALS UPON DISCHARGE:    Home Health:   PT                 Agency: [facility] Phone: [phone]  Medical Equipment/Items Ordered: [place]                                                 Agency/Supplier: [facility]   [phone]   My questions have been answered and I understand these instructions. I will adhere to these goals and the provided educational materials after my discharge from the hospital.  Patient/Caregiver Signature _______________________________ Date __________  Clinician Signature _______________________________________ Date __________  Please bring this form and your medication list with you to all your follow-up doctor's appointments.

## 2021-08-30 NOTE — Plan of Care (Signed)
Problem: RH Balance Goal: LTG Patient will maintain dynamic standing balance (PT) Description: LTG:  Patient will maintain dynamic standing balance with assistance during mobility activities (PT) Outcome: Completed/Met   Problem: Sit to Stand Goal: LTG:  Patient will perform sit to stand with assistance level (PT) Description: LTG:  Patient will perform sit to stand with assistance level (PT) Outcome: Completed/Met   Problem: RH Bed Mobility Goal: LTG Patient will perform bed mobility with assist (PT) Description: LTG: Patient will perform bed mobility with assistance, with/without cues (PT). Outcome: Completed/Met   Problem: RH Bed to Chair Transfers Goal: LTG Patient will perform bed/chair transfers w/assist (PT) Description: LTG: Patient will perform bed to chair transfers with assistance (PT). Outcome: Completed/Met   Problem: RH Car Transfers Goal: LTG Patient will perform car transfers with assist (PT) Description: LTG: Patient will perform car transfers with assistance (PT). Outcome: Completed/Met   Problem: RH Ambulation Goal: LTG Patient will ambulate in controlled environment (PT) Description: LTG: Patient will ambulate in a controlled environment, # of feet with assistance (PT). Outcome: Completed/Met Goal: LTG Patient will ambulate in home environment (PT) Description: LTG: Patient will ambulate in home environment, # of feet with assistance (PT). Outcome: Completed/Met

## 2021-08-30 NOTE — Progress Notes (Signed)
Occupational Therapy Session Note  Patient Details  Name: Shaun [name] MRN: [id number] Date of Birth: [birth year]  Today's Date: 08/30/2021 OT Individual Time: 0955-1030 OT Individual Time Calculation (min): 35 min    Short Term Goals: Week 1:  OT Short Term Goal 1 (Week 1): STGs = LTGs  Skilled Therapeutic Interventions/Progress Updates:    Pt and his wife received in room, they had discussed with team about leaving today as they feel ready and pt is at a mod I/S level.  His wife will be with him 24/7.   Today to prepare for discharge this morning, reviewed with pt and family: -gentle LUE AROM -dressing strategies to compensate for weak L shoulder and painful R hip with AE -sit to stand from very low surfaces with repeat demonstration from pt's wife -stepping into shower stall - pt does best with stepping in backwards leading with R foot going in and R foot coming out. -education on continuing his exercises so his endurance is stronger for upcoming surgeries  Pt and family state they are ready to go and feel very prepared for home .  They are just waiting on d/c paperwork.   Therapy Documentation Precautions:  Precautions Precautions: Fall Restrictions Weight Bearing Restrictions: No       Pain:  Pain Assessment Pain Scale: 0-10 Pain Score: 5  Pain Type: Chronic pain Pain Location: Back Pain Descriptors / Indicators: Discomfort Pain Intervention(s): Medication (See eMAR)    Therapy/Group: Individual Therapy  [place] 08/30/2021, 10:47 AM

## 2021-08-30 NOTE — Progress Notes (Signed)
Occupational Therapy Discharge Summary  Patient Details  Name: Shaun [name] MRN: [id number] Date of Birth: June 01, 1956   Patient has met 8 of 8 long term goals due to improved activity tolerance, improved balance, and ability to compensate for deficits.  Patient to discharge at overall Modified Independent level for toileting, toilet transfers, standing balance. Recommend S for getting in and out of shower, bathing and dressing to remind him of adaptive dressing strategies as needed.    Patient's care partner is independent to provide the necessary physical assistance at discharge.    Family education occurred daily as wife present at all therapy sessions.   Reasons goals not met: n/a  Recommendation:  Patient will benefit from ongoing skilled OT services in home health setting to continue to advance functional skills in the area of iADL.  Equipment: BSC  Reasons for discharge: treatment goals met  Patient/family agrees with progress made and goals achieved: Yes  OT Discharge Precautions/Restrictions  Precautions Precautions: Fall Restrictions Weight Bearing Restrictions: No  ADL ADL Eating: Independent Grooming: Independent Upper Body Bathing: Supervision/safety Where Assessed-Upper Body Bathing: Shower Lower Body Bathing: Supervision/safety Where Assessed-Lower Body Bathing: Shower Upper Body Dressing: Supervision/safety Where Assessed-Upper Body Dressing: Standing at sink, Sitting at sink Lower Body Dressing: Supervision/safety Where Assessed-Lower Body Dressing: Standing at sink, Sitting at sink Toileting: Modified independent Where Assessed-Toileting: Risk analyst Method: Counselling psychologist: Grab bars, Raised toilet seat Social research officer, government: Close supervision Social research officer, government Method: Heritage manager: Civil engineer, contracting with back, Grab bars Vision Baseline  Vision/History: 1 Wears glasses Patient Visual Report: No change from baseline Vision Assessment?: No apparent visual deficits Perception  Perception: Within Functional Limits Praxis Praxis: Intact Cognition Overall Cognitive Status: Within Functional Limits for tasks assessed Arousal/Alertness: Awake/alert Orientation Level: Oriented X4 Year: 2022 Month: January Day of Week: Correct Memory: Appears intact Immediate Memory Recall: Sock;Blue;Bed Memory Recall Sock: Without Cue Memory Recall Blue: Without Cue Memory Recall Bed: Without Cue Sensation Sensation Light Touch: Appears Intact Hot/Cold: Appears Intact Proprioception: Appears Intact Stereognosis: Appears Intact Coordination Gross Motor Movements are Fluid and Coordinated: No Fine Motor Movements are Fluid and Coordinated: Yes Coordination and Movement Description: Antalgic movement patterns related to chronic pain in R hip and L shoulder Motor  Motor Motor - Discharge Observations: Generalized weakness and deconditioning Mobility  Bed Mobility Rolling Right: Independent Rolling Left: Independent Supine to Sit: Independent Transfers Sit to Stand: Independent with assistive device Stand to Sit: Independent with assistive device  Trunk/Postural Assessment  Postural Control Postural Control: Within Functional Limits  Balance Static Sitting Balance Static Sitting - Level of Assistance: 7: Independent Dynamic Sitting Balance Dynamic Sitting - Level of Assistance: 7: Independent Static Standing Balance Static Standing - Level of Assistance: 6: Modified independent (Device/Increase time) Dynamic Standing Balance Dynamic Standing - Level of Assistance: 6: Modified independent (Device/Increase time) Extremity/Trunk Assessment RUE Assessment Active Range of Motion (AROM) Comments: functional for basic ADLS, has history of shoulder surgery LUE Assessment Active Range of Motion (AROM) Comments: limited due to pain,  pt is planning on having shoulder replacement surgery in the future General Strength Comments: Pt able to use LUE as an active A during ADLS and uses adaptive strategies   Wake Conlee 08/30/2021, 10:54 AM

## 2021-08-30 NOTE — Progress Notes (Signed)
Inpatient Rehabilitation Discharge Medication Review by a Pharmacist  A complete drug regimen review was completed for this patient to identify any potential clinically significant medication issues.  High Risk Drug Classes Is patient taking? Indication by Medication  Antipsychotic Yes Abilify for mood  Anticoagulant Yes Eliquis for Afib  Antibiotic No   Opioid Yes Tramadol for pain  Antiplatelet No   Hypoglycemics/insulin Yes Metformin for DM  Vasoactive Medication Yes Lasix, metoprolol for BP  Chemotherapy No   Other Yes Lipitor, Zetia for HLD Lexapro for mood Protonix for Gerd Trazodone for sleep Requip for RLS     Type of Medication Issue Identified Description of Issue Recommendation(s)  Drug Interaction(s) (clinically significant)     Duplicate Therapy     Allergy     No Medication Administration End Date     Incorrect Dose     Additional Drug Therapy Needed     Significant med changes from prior encounter (inform family/care partners about these prior to discharge).    Other       Clinically significant medication issues were identified that warrant physician communication and completion of prescribed/recommended actions by midnight of the next day:  No    Time spent performing this drug regimen review (minutes):  20 minutes   Tad Moore 08/30/2021 10:09 AM

## 2021-08-30 NOTE — Progress Notes (Addendum)
Patient ID: Shaun [name], male   DOB: Nov 22, 1955, 66 y.o.   MRN: [id number] Received message pt and wife want to discharge home today. [employer] closed today will try another equipment company most closed or only delivering emergency home O2. Will talk with pt and wife regarding what they would like to do.  9:32 AM Pt and wife report they have her father's rolling walker to use until he can get his own. MD is ok with this. Discussed home health and they have a niece who works for [facility]. Will see if they will take referral. Team and MD agreeable to discharge today. Both pt and wife happy with due to may be private pay here since insurance changed 08/29/2021

## 2021-08-30 NOTE — Progress Notes (Signed)
Physical Therapy Discharge Summary  Patient Details  Name: Shaun [name] MRN: [id number] Date of Birth: 03-06-1956   Patient has met 8 of 8 long term goals due to improved activity tolerance, improved balance, improved postural control, increased strength, decreased pain, ability to compensate for deficits, and improved coordination.  Patient to discharge at an ambulatory level mod I- Supervision w/RW.   Patient's care partner is independent to provide the necessary physical assistance at discharge and has participated in family training.   Recommendation:  Patient will benefit from ongoing skilled PT services in home health setting to continue to advance safe functional mobility, address ongoing impairments in endurance, global strength and minimize fall risk.  Equipment: RW  Reasons for discharge: treatment goals met and discharge from hospital  Patient/family agrees with progress made and goals achieved: Yes  PT Discharge Precautions/Restrictions Precautions Precautions: Fall Restrictions Weight Bearing Restrictions: No Pain Interference Pain Interference Pain Effect on Sleep: 4. Almost constantly Pain Interference with Therapy Activities: 4. Almost constantly Pain Interference with Day-to-Day Activities: 4. Almost constantly Vision/Perception  Vision - History Ability to See in Adequate Light: 0 Adequate Perception Perception: Within Functional Limits Praxis Praxis: Intact  Cognition Overall Cognitive Status: Within Functional Limits for tasks assessed Arousal/Alertness: Awake/alert Orientation Level: Oriented X4 Year: 2022 Month: January Day of Week: Correct Memory: Appears intact Immediate Memory Recall: Sock;Blue;Bed Memory Recall Sock: Without Cue Memory Recall Blue: Without Cue Memory Recall Bed: Without Cue Safety/Judgment: Appears intact Sensation Sensation Light Touch: Appears Intact Hot/Cold: Appears Intact Proprioception: Appears  Intact Stereognosis: Appears Intact Coordination Gross Motor Movements are Fluid and Coordinated: No Fine Motor Movements are Fluid and Coordinated: Yes Coordination and Movement Description: Antalgic movement patterns related to chronic pain in R hip and L shoulder Heel Shin Test: WNL, limited some by hip flexor weakness Motor  Motor Motor: Other (comment) Motor - Discharge Observations: Generalized weakness and deconditioning  Mobility Bed Mobility Bed Mobility: Supine to Sit;Sit to Supine;Rolling Right;Rolling Left Rolling Right: Independent Rolling Left: Independent Supine to Sit: Independent Sitting - Scoot to Edge of Bed: Independent Sit to Supine: Independent Transfers Transfers: Sit to Stand;Stand to [employer] Transfers Sit to Stand: Independent with assistive device Stand to Sit: Independent with assistive device Stand Pivot Transfers: Independent with assistive device Transfer (Assistive device): Rolling walker Locomotion  Gait Ambulation: Yes Gait Assistance: Supervision/Verbal cueing Gait Distance (Feet): 100 Feet Assistive device: Rolling walker Gait Assistance Details: Verbal cues for safe use of DME/AE Gait Gait: Yes Gait Pattern: Impaired Gait Pattern: Step-through pattern;Decreased step length - left;Decreased stance time - right;Decreased hip/knee flexion - right;Decreased stride length;Decreased weight shift to right;Antalgic;Trunk flexed Gait velocity: decreased Stairs / Additional Locomotion Stairs: Yes Stairs Assistance: Contact Guard/Touching assist Stair Management Technique: No rails;With walker Number of Stairs: 2 Height of Stairs: 6 Curb: Contact Guard/Touching assist Pick up small object from the floor assist level: Contact Guard/Touching assist Pick up small object from the floor assistive device: RW and Runner, broadcasting/film/video Wheelchair Mobility: No  Trunk/Postural Assessment  Cervical Assessment Cervical Assessment:  Exceptions to [facility] (Forward head) Thoracic Assessment Thoracic Assessment: Exceptions to [facility] (Kyphotic) Lumbar Assessment Lumbar Assessment: Exceptions to [facility] (Posterior pelvic tilt) Postural Control Postural Control: Within Functional Limits  Balance Balance Balance Assessed: Yes Static Sitting Balance Static Sitting - Balance Support: Feet supported;No upper extremity supported Static Sitting - Level of Assistance: 7: Independent Dynamic Sitting Balance Dynamic Sitting - Balance Support: Feet supported;During functional activity Dynamic Sitting - Level of Assistance: 7: Independent  Static Standing Balance Static Standing - Balance Support: Bilateral upper extremity supported;During functional activity Static Standing - Level of Assistance: 6: Modified independent (Device/Increase time) Dynamic Standing Balance Dynamic Standing - Balance Support: During functional activity;Bilateral upper extremity supported Dynamic Standing - Level of Assistance: 6: Modified independent (Device/Increase time) Extremity Assessment  RUE Assessment Active Range of Motion (AROM) Comments: functional for basic ADLS, has history of shoulder surgery LUE Assessment Active Range of Motion (AROM) Comments: limited due to pain, pt is planning on having shoulder replacement surgery in the future General Strength Comments: Pt able to use LUE as an active A during ADLS and uses adaptive strategies RLE Assessment RLE Assessment: Exceptions to [facility] RLE Strength RLE Overall Strength: Deficits Right Hip Flexion: 3/5 Right Hip Extension: 3/5 Right Hip ABduction: 3/5 Right Hip ADduction: 3+/5 Right Knee Flexion: 4-/5 Right Knee Extension: 4/5 Right Ankle Dorsiflexion: 4/5 Right Ankle Plantar Flexion: 4/5 LLE Assessment LLE Assessment: Exceptions to [facility] LLE Strength LLE Overall Strength: Deficits Left Hip Flexion: 3-/5 Left Hip Extension: 3/5 Left Hip ABduction: 3/5 Left Hip ADduction: 3/5 Left Knee  Flexion: 3+/5 Left Knee Extension: 4/5 Left Ankle Dorsiflexion: 4/5 Left Ankle Plantar Flexion: 4/5   Khori Underberg E Shaka Cardin, PT, DPT 08/30/2021, 12:30 PM

## 2021-08-30 NOTE — Plan of Care (Signed)
Problem: RH Stairs °Goal: LTG Patient will ambulate up and down stairs w/assist (PT) °Description: LTG: Patient will ambulate up and down # of stairs with assistance (PT) °Outcome: Completed/Met °  °

## 2021-08-30 NOTE — Progress Notes (Signed)
Inpatient Rehabilitation Care Coordinator Discharge Note   Patient Details  Name: Shaun [name] MRN: [id number] Date of Birth: Jul 12, 1956   Discharge location: HOME WITH WIFE WHO HAS BEEN HERE DAILY TO SEE HIM IN THERAPIES  Length of Stay: 4 DAYS  Discharge activity level: SUPERVISION-MIN LEVEL  Home/community participation: ACTIVE  Patient response GS:UPJSRP Literacy - How often do you need to have someone help you when you read instructions, pamphlets, or other written material from your doctor or pharmacy?: Never  Patient response RX:YVOPFY Isolation - How often do you feel lonely or isolated from those around you?: Never  Services provided included: MD, RD, PT, OT, RN, CM, Pharmacy, SW  Financial Services:  Charity fundraiser Utilized: Private Insurance [place] [facility] 08/29/2021  Choices offered to/list presented to: PT AND WIFE  Follow-up services arranged:  Home Health, DME, Patient/Family request agency HH/DME Home Health Agency: [facility]    DME : [facility] [place] HH/DME Requested Agency: [place]  Patient response to transportation need: Is the patient able to respond to transportation needs?: Yes In the past 12 months, has lack of transportation kept you from medical appointments or from getting medications?: No In the past 12 months, has lack of transportation kept you from meetings, work, or from getting things needed for daily living?: No    Comments (or additional information):PT AND WIFE REQUESTED TO [facility] 08/29/2021. WIFE FEELS ABLE TO PROVIDE CARE. WILL USE FATHER;S WALKER UNTIL CAN GET ONE DELIVERED FROM [employer].  Patient/Family verbalized understanding of follow-up arrangements:  Yes  Individual responsible for coordination of the follow-up plan: [name]-WIFE  [phone]  Confirmed correct DME delivered: Elease Hashimoto 08/30/2021    Elease Hashimoto

## 2021-08-30 NOTE — Progress Notes (Signed)
Patient discharged this shift from unit. Patient and family voices understanding of discharge teaching. Patient discharged via wheelchair by staff

## 2021-08-31 ENCOUNTER — Other Ambulatory Visit (HOSPITAL_BASED_OUTPATIENT_CLINIC_OR_DEPARTMENT_OTHER): Payer: Self-pay

## 2021-08-31 ENCOUNTER — Other Ambulatory Visit: Payer: Self-pay

## 2021-08-31 DIAGNOSIS — J9600 Acute respiratory failure, unspecified whether with hypoxia or hypercapnia: Secondary | ICD-10-CM

## 2021-08-31 DIAGNOSIS — I5032 Chronic diastolic (congestive) heart failure: Secondary | ICD-10-CM

## 2021-08-31 DIAGNOSIS — E119 Type 2 diabetes mellitus without complications: Secondary | ICD-10-CM

## 2021-09-02 ENCOUNTER — Telehealth: Payer: Self-pay | Admitting: *Deleted

## 2021-09-02 DIAGNOSIS — F17210 Nicotine dependence, cigarettes, uncomplicated: Secondary | ICD-10-CM | POA: Diagnosis not present

## 2021-09-02 DIAGNOSIS — G4700 Insomnia, unspecified: Secondary | ICD-10-CM | POA: Diagnosis not present

## 2021-09-02 DIAGNOSIS — I4821 Permanent atrial fibrillation: Secondary | ICD-10-CM | POA: Diagnosis not present

## 2021-09-02 DIAGNOSIS — I5030 Unspecified diastolic (congestive) heart failure: Secondary | ICD-10-CM | POA: Diagnosis not present

## 2021-09-02 DIAGNOSIS — J9601 Acute respiratory failure with hypoxia: Secondary | ICD-10-CM | POA: Diagnosis not present

## 2021-09-02 DIAGNOSIS — J449 Chronic obstructive pulmonary disease, unspecified: Secondary | ICD-10-CM | POA: Diagnosis not present

## 2021-09-02 DIAGNOSIS — E119 Type 2 diabetes mellitus without complications: Secondary | ICD-10-CM | POA: Diagnosis not present

## 2021-09-02 DIAGNOSIS — F32A Depression, unspecified: Secondary | ICD-10-CM | POA: Diagnosis not present

## 2021-09-02 DIAGNOSIS — K219 Gastro-esophageal reflux disease without esophagitis: Secondary | ICD-10-CM | POA: Diagnosis not present

## 2021-09-02 DIAGNOSIS — Z7901 Long term (current) use of anticoagulants: Secondary | ICD-10-CM | POA: Diagnosis not present

## 2021-09-02 DIAGNOSIS — D649 Anemia, unspecified: Secondary | ICD-10-CM | POA: Diagnosis not present

## 2021-09-02 DIAGNOSIS — E785 Hyperlipidemia, unspecified: Secondary | ICD-10-CM | POA: Diagnosis not present

## 2021-09-02 DIAGNOSIS — M1990 Unspecified osteoarthritis, unspecified site: Secondary | ICD-10-CM | POA: Diagnosis not present

## 2021-09-02 DIAGNOSIS — G4733 Obstructive sleep apnea (adult) (pediatric): Secondary | ICD-10-CM | POA: Diagnosis not present

## 2021-09-02 DIAGNOSIS — Z79891 Long term (current) use of opiate analgesic: Secondary | ICD-10-CM | POA: Diagnosis not present

## 2021-09-02 DIAGNOSIS — I110 Hypertensive heart disease with heart failure: Secondary | ICD-10-CM | POA: Diagnosis not present

## 2021-09-02 DIAGNOSIS — G2581 Restless legs syndrome: Secondary | ICD-10-CM | POA: Diagnosis not present

## 2021-09-02 DIAGNOSIS — I2510 Atherosclerotic heart disease of native coronary artery without angina pectoris: Secondary | ICD-10-CM | POA: Diagnosis not present

## 2021-09-02 NOTE — Chronic Care Management (AMB) (Signed)
Chronic Care Management   Note  09/02/2021 Name: WILMON CONOVER MRN: [id number] DOB: 17-Aug-1956  Fortunato Curling Knobloch is a 66 y.o. year old male who is a primary care patient of Maximiano Coss, NP. I reached out to Leta Jungling by phone today in response to a referral sent by Mr. Christphor Groft Burtt's PCP.  Mr. Parcell was given information about Chronic Care Management services today including:  CCM service includes personalized support from designated clinical staff supervised by his physician, including individualized plan of care and coordination with other care providers 24/7 contact phone numbers for assistance for urgent and routine care needs. Service will only be billed when office clinical staff spend 20 minutes or more in a month to coordinate care. Only one practitioner may furnish and bill the service in a calendar month. The patient may stop CCM services at any time (effective at the end of the month) by phone call to the office staff. The patient is responsible for co-pay (up to 20% after annual deductible is met) if co-pay is required by the individual health plan.   Spouse, Duke Salvia DPR on file  verbally agreed to assistance and services provided by embedded care coordination/care management team today.  Follow up plan: Telephone appointment with care management team member scheduled for:09/22/21  [place]: [phone]

## 2021-09-06 ENCOUNTER — Telehealth: Payer: Self-pay

## 2021-09-06 ENCOUNTER — Ambulatory Visit (INDEPENDENT_AMBULATORY_CARE_PROVIDER_SITE_OTHER): Payer: Medicare Other | Admitting: Registered Nurse

## 2021-09-06 ENCOUNTER — Encounter: Payer: Self-pay | Admitting: Registered Nurse

## 2021-09-06 VITALS — BP 102/63 | HR 84 | Temp 98.1°F | Ht 67.0 in | Wt 200.0 lb

## 2021-09-06 DIAGNOSIS — R270 Ataxia, unspecified: Secondary | ICD-10-CM | POA: Diagnosis not present

## 2021-09-06 DIAGNOSIS — Z0279 Encounter for issue of other medical certificate: Secondary | ICD-10-CM

## 2021-09-06 DIAGNOSIS — Z09 Encounter for follow-up examination after completed treatment for conditions other than malignant neoplasm: Secondary | ICD-10-CM

## 2021-09-06 LAB — COMPREHENSIVE METABOLIC PANEL
ALT: 41 U/L (ref 0–53)
AST: 27 U/L (ref 0–37)
Albumin: 3.5 g/dL (ref 3.5–5.2)
Alkaline Phosphatase: 114 U/L (ref 39–117)
BUN: 14 mg/dL (ref 6–23)
CO2: 21 mEq/L (ref 19–32)
Calcium: 8.5 mg/dL (ref 8.4–10.5)
Chloride: 105 mEq/L (ref 96–112)
Creatinine, Ser: 0.78 mg/dL (ref 0.40–1.50)
GFR: 93.84 mL/min (ref >60.00)
Glucose, Bld: 92 mg/dL (ref 70–99)
Potassium: 4.5 mEq/L (ref 3.5–5.1)
Sodium: 139 mEq/L (ref 135–145)
Total Bilirubin: 0.5 mg/dL (ref 0.2–1.2)
Total Protein: 6.1 g/dL (ref 6.0–8.3)

## 2021-09-06 NOTE — Progress Notes (Signed)
Established Patient Office Visit  Subjective:  Patient ID: Shaun [name], male    DOB: [birth year]  Age: 66 y.o. MRN: [id number]  CC:  Chief Complaint  Patient presents with   Hospitalization Follow-up    Patient state he is here for a HFU. Patient would also like to discuss his hands shaking     HPI SHOUA RESSLER presents for HFU  Presented to ED on 08/06/21 with dyspnea. Hx of ASCVD, CHF, COPD, t2dm, hld, htn, OSA, paroxysmal afib, and more. Had tested positive for flu A on 08/01/21 - seen by Evelina Dun, FNP on 08/01/21 with VV: dx with COPD exacerbation, given albuterol, tessalon, prednisone. Unfortunately not helpful. Tylenol helped with fever, tmax at home 102.  Had started to feel lightheaded and chest tightness. Increase in orthopnea without evidence of fluid overload. Poor sleep and appetite due to illness  On arrival to ED mild temp to 99.7, tachy to 101-125, BP 125/88, sat 96% on 3L o2 via [place]. WBC 10.3, hgb 15 Decrease bicarb to 21, AKI with Cr 1.43 (previously 0.89) Fluid positive Cxr clear Admitted for hypoxia.   Unfortunately decompensated within 24 hours of admission, intubated by 12/12. Required pressors for septic shock. Entered afib requiring IV amiodarone. Placed on abx as suspected secondary pna.  Echocardiogram on 08/10/21 showed EF 60-65% w/o wall abnormalities. Able to be weaned off of pressors, extubated on 12/21, down to 3.5L o2 via [place] at 98%.  Po metoprolol and apixaban started for afib.   Did have some dysphagia due to deconditioning, could not tolerate NG tube - improved in coming days.  Did develop some fluid overload with shob on 12/25, started on lasix, resolved.  Admitted to inpatient rehab on 08/25/21. Improved well over course of 5 day stay. D/c on 08/30/21.  Has follow ups: Today with myself, 09/14/21 with rehab dr. Letta Pate, 09/23/21 with Laurann Montana NP cardiology.  Since discharge, pt reports: Doing well. No concerns. Had occ  lightheadedness but seems to be getting better. Mostly assoc with exertion.  Normal GI function. No cardiac concerns or pulmonary concerns Some weakness, likely atrophy. Continues to mildly exercise as tolerated.  Has home health therapy starting tomorrow. Optimistic for this.  Olin Hauser has been taking care of him - out continuously dec9-jan9, needs intermittent fmla through follow up  Past Medical History:  Diagnosis Date   Anemia    Anxiety    Asthma    CAD (coronary artery disease)    DES to distal circumflex 2016   Cataract    Colon polyps    30 colon polyps found on first colonoscopy   Diastolic heart failure (HCC)    Diverticulitis    DJD (degenerative joint disease)    GERD (gastroesophageal reflux disease)    History of kidney stones    Hyperlipidemia    Hypertension    Insomnia    Obstructive sleep apnea 12/2009   01/26/2010 AHI 83/hr   Permanent atrial fibrillation ([place])    Onset 2006 paroxysmal then progressive to persistent   PUD (peptic ulcer disease)    1980s   RLS (restless legs syndrome)    Sinusitis    Skin cancer    Type 2 diabetes mellitus ([place])     Past Surgical History:  Procedure Laterality Date   BIOPSY  07/17/2018   Procedure: BIOPSY;  Surgeon: Danie Binder, MD;  [facility];  Service: Endoscopy;;  colon   BOWEL RESECTION  09/17/2018   SMALL BOWEL RESECTION:  71 CM    CARDIAC CATHETERIZATION N/A 07/21/2015   Procedure: Left Heart Cath and Coronary Angiography;  Surgeon: Peter M Martinique, MD;  Location: [place] [facility];  Service: Cardiovascular;  Laterality: N/A;   CARDIAC CATHETERIZATION N/A 07/21/2015   Procedure: Coronary Stent Intervention;  Surgeon: Peter M Martinique, MD;  Location: [place] [facility];  Service: Cardiovascular;  Laterality: N/A;   CIRCUMCISION N/A 04/05/2019   Procedure: CIRCUMCISION ADULT;  Surgeon: Irine Seal, MD;  Location: [facility];  Service: Urology;  Laterality: N/A;   COLONOSCOPY N/A 05/19/2014   Dr.  Barnie Alderman diverticulosis/moderate external hemorrhoids, >20 simple adenomas. Genetic screening negative.    COLONOSCOPY WITH PROPOFOL N/A 07/17/2018   Dr. Oneida Alar: Diverticulosis, external/internal hemorrhoids, 32 colon polyps removed.  ten tubular adenomas removed with no high-grade dysplasia.  Advised to have surveillance colonoscopy in 3 years.   COLONOSCOPY WITH PROPOFOL N/A 06/07/2021   Procedure: COLONOSCOPY WITH PROPOFOL;  Surgeon: Eloise Harman, DO;  [facility];  Service: Endoscopy;  Laterality: N/A;  9:30 / ASA 3  (Pt was told that his time will be given at Pre-op)   ESOPHAGOGASTRODUODENOSCOPY (EGD) WITH PROPOFOL N/A 07/17/2018   Dr. Oneida Alar: Low-grade narrowing Schatzki ring at the GE junction status post dilation.  Gastritis.  Biopsy with mild nonspecific reactive gastropathy.  No H. pylori.   GIVENS CAPSULE STUDY N/A 06/24/2019   normal   HERNIA REPAIR  1986   Left inguinal   INTRAVASCULAR PRESSURE WIRE/FFR STUDY Left 06/08/2017   Procedure: INTRAVASCULAR PRESSURE WIRE/FFR STUDY;  Surgeon: Nelva Bush, MD;  Location: [place] [facility];  Service: Cardiovascular;  Laterality: Left;  LAD and CFX   LAPAROTOMY N/A 09/17/2018   Procedure: EXPLORATORY LAPAROTOMY;  Surgeon: Virl Cagey, MD;  Location[facility];  Service: General;  Laterality: N/A;   LEFT HEART CATH AND CORONARY ANGIOGRAPHY N/A 06/08/2017   Procedure: LEFT HEART CATH AND CORONARY ANGIOGRAPHY;  Surgeon: Nelva Bush, MD;  Location: [place] [facility];  Service: Cardiovascular;  Laterality: N/A;   POLYPECTOMY  07/17/2018   Procedure: POLYPECTOMY;  Surgeon: Danie Binder, MD;  [facility];  Service: Endoscopy;;  colon   POLYPECTOMY  06/07/2021   Procedure: POLYPECTOMY INTESTINAL;  Surgeon: Eloise Harman, DO;  [facility];  Service: Endoscopy;;   ROTATOR CUFF REPAIR     Right   SAVORY DILATION N/A 07/17/2018   Procedure: SAVORY DILATION;  Surgeon: Danie Binder,  MD;  [facility];  Service: Endoscopy;  Laterality: N/A;    Family History  Problem Relation Age of Onset   Hypertension Mother    Breast cancer Mother 57       brain/bone    Heart attack Father    Skin cancer Sister 73   Diabetes Brother    Parkinson's disease Brother    Brain cancer Maternal Uncle    Cancer Maternal Uncle        NOS   Breast cancer Cousin        maternal cousin dx <50   Cancer Cousin    Colon cancer Neg Hx     Social History   Socioeconomic History   Marital status: Married    Spouse name: Not on file   Number of children: 5   Years of education: Not on file   Highest education level: Not on file  Occupational History   Occupation: employed    Employer: [employer]: full-time  Tobacco  Use   Smoking status: Every Day    Packs/day: 0.50    Years: 39.00    Pack years: 19.50    Types: Cigarettes    Start date: 07/24/1970   Smokeless tobacco: Never  Vaping Use   Vaping Use: Never used  Substance and Sexual Activity   Alcohol use: No    Alcohol/week: 0.0 standard drinks   Drug use: No   Sexual activity: Yes    Partners: Female  Other Topics Concern   Not on file  Social History Narrative   Not on file   Social Determinants of Health   Financial Resource Strain: Not on file  Food Insecurity: Not on file  Transportation Needs: Not on file  Physical Activity: Not on file  Stress: Not on file  Social Connections: Not on file  Intimate Partner Violence: Not on file    Outpatient Medications Prior to Visit  Medication Sig Dispense Refill   acetaminophen (TYLENOL) 325 MG tablet Take 2 tablets (650 mg total) by mouth every 6 (six) hours as needed for mild pain.     albuterol (VENTOLIN HFA) 108 (90 Base) MCG/ACT inhaler INHALE 2 PUFFS INTO THE LUNGS EVERY 6 HOURS AS NEEDED FOR WHEEZING OR SHORTNESS OF BREATH 18 g 11   apixaban (ELIQUIS) 5 MG TABS tablet TAKE 1 TABLET (5 MG) BY MOUTH TWICE DAILY 180 tablet 1   ARIPiprazole  (ABILIFY) 2 MG tablet Take 1 tablet (2 mg total) by mouth daily. 90 tablet 0   atorvastatin (LIPITOR) 80 MG tablet Take 1 tablet (80 mg total) by mouth daily. TAKE 1 TABLET(80 MG) BY MOUTH EVERY EVENING 90 tablet 3   docusate sodium (COLACE) 100 MG capsule Take 100 mg by mouth daily as needed for mild constipation.     escitalopram (LEXAPRO) 20 MG tablet TAKE 1 TABLET(20 MG) BY MOUTH DAILY 90 tablet 0   ezetimibe (ZETIA) 10 MG tablet TAKE 1 TABLET BY MOUTH EVERY DAY 90 tablet 3   ferrous sulfate 325 (65 FE) MG tablet Take 325 mg by mouth daily with breakfast.     furosemide (LASIX) 40 MG tablet Take 1 tablet (40 mg total) by mouth daily. 90 tablet 3   magnesium gluconate (MAGONATE) 500 MG tablet Take 0.5 tablets (250 mg total) by mouth at bedtime. 30 tablet 0   metFORMIN (GLUCOPHAGE) 500 MG tablet TAKE 1 TABLET (500 MG) BY MOUTH TWICE DAILY WITH A MEAL. 180 tablet 0   Metoprolol Tartrate 75 MG TABS Take 75 mg by mouth 2 (two) times daily. 60 tablet 0   nitroGLYCERIN (NITROSTAT) 0.4 MG SL tablet Place 1 tablet (0.4 mg total) under the tongue every 5 (five) minutes as needed. 25 tablet 3   omeprazole (PRILOSEC) 40 MG capsule Take 1 capsule by mouth once daily 30 minutes before breakfast 90 capsule 3   potassium chloride SA (KLOR-CON) 20 MEQ tablet Take 1 tablet (20 mEq total) by mouth daily. 90 tablet 3   rOPINIRole (REQUIP) 3 MG tablet TAKE 1 TABLET BY MOUTH EVERY NIGHT AT BEDTIME 450 tablet 0   sucralfate (CARAFATE) 1 g tablet Take one tablet po BID PRN (Patient taking differently: Take 1 g by mouth 2 (two) times daily as needed (acid reflux).) 42 tablet 5   traMADol (ULTRAM) 50 MG tablet Take 1 tablet (50 mg total) by mouth every 6 (six) hours as needed for moderate pain or severe pain. You should have 20 day supply of medication at home. 30 tablet  traZODone (DESYREL) 50 MG tablet TAKE 2 TABLETS (100 MG) BY MOUTH AT BEDTIME 180 tablet 1   No facility-administered medications prior to visit.     No Known Allergies  ROS Review of Systems  Constitutional: Negative.   HENT: Negative.    Eyes: Negative.   Respiratory: Negative.    Cardiovascular: Negative.   Gastrointestinal: Negative.   Genitourinary: Negative.   Musculoskeletal: Negative.   Skin: Negative.   Neurological: Negative.   Psychiatric/Behavioral: Negative.    All other systems reviewed and are negative.    Objective:    Physical Exam Constitutional:      General: He is not in acute distress.    Appearance: Normal appearance. He is normal weight. He is not ill-appearing, toxic-appearing or diaphoretic.  Cardiovascular:     Rate and Rhythm: Normal rate and regular rhythm.     Heart sounds: Normal heart sounds. No murmur heard.   No friction rub. No gallop.  Pulmonary:     Effort: Pulmonary effort is normal. No respiratory distress.     Breath sounds: Normal breath sounds. No stridor. No wheezing, rhonchi or rales.  Chest:     Chest wall: No tenderness.  Neurological:     General: No focal deficit present.     Mental Status: He is alert and oriented to person, place, and time. Mental status is at baseline.  Psychiatric:        Mood and Affect: Mood normal.        Behavior: Behavior normal.        Thought Content: Thought content normal.        Judgment: Judgment normal.    BP 102/63    Pulse 84    Temp 98.1 F (36.7 C) (Temporal)    Ht 5' 7"  (1.702 m)    Wt 200 lb (90.7 kg)    SpO2 97%    BMI 31.32 kg/m  Wt Readings from Last 3 Encounters:  09/06/21 200 lb (90.7 kg)  08/30/21 198 lb 3.1 oz (89.9 kg)  08/25/21 206 lb 12.7 oz (93.8 kg)     Health Maintenance Due  Topic Date Due   Zoster Vaccines- Shingrix (2 of 2) 05/25/2018   COVID-19 Vaccine (4 - Booster for Pfizer series) 09/24/2020   URINE MICROALBUMIN  12/31/2020   FOOT EXAM  05/26/2021    There are no preventive care reminders to display for this patient.  Lab Results  Component Value Date   TSH 0.40 02/10/2021   Lab Results   Component Value Date   WBC 5.2 08/26/2021   HGB 11.9 (L) 08/26/2021   HCT 36.4 (L) 08/26/2021   MCV 86.1 08/26/2021   PLT 150 08/26/2021   Lab Results  Component Value Date   NA 138 08/30/2021   K 3.6 08/30/2021   CO2 22 08/30/2021   GLUCOSE 136 (H) 08/30/2021   BUN 16 08/30/2021   CREATININE 0.97 08/30/2021   BILITOT 0.8 08/26/2021   ALKPHOS 86 08/26/2021   AST 39 08/26/2021   ALT 75 (H) 08/26/2021   PROT 5.0 (L) 08/26/2021   ALBUMIN 1.9 (L) 08/26/2021   CALCIUM 7.8 (L) 08/30/2021   ANIONGAP 11 08/30/2021   EGFR 96 06/28/2021   GFR 90.23 02/10/2021   Lab Results  Component Value Date   CHOL 156 02/10/2021   Lab Results  Component Value Date   HDL 28.90 (L) 02/10/2021   Lab Results  Component Value Date   LDLCALC 92 02/10/2021   Lab Results  Component Value Date   TRIG 153 (H) 08/18/2021   Lab Results  Component Value Date   CHOLHDL 5 02/10/2021   Lab Results  Component Value Date   HGBA1C 6.9 (H) 08/07/2021      Assessment & Plan:   Problem List Items Addressed This Visit   None Visit Diagnoses     Hospital discharge follow-up    -  Primary   Relevant Orders   CBC with Differential/Platelet   Comprehensive metabolic panel   Ataxia       Relevant Orders   MR Brain W Wo Contrast       No orders of the defined types were placed in this encounter.   Follow-up: Return if symptoms worsen or fail to improve, for as scheduled.Olin Hauser - FMLA - Dec 9 - Jan 9 continuous, Jan 9 - Jul 9 intermittent   PLAN Pt stable since dc Continue home therapy, deep breathing exercises, incentive spirometry Return as scheduled Replace order for missed MRI. Defer hernia repair Cbc and cmp ordered Patient encouraged to call clinic with any questions, comments, or concerns.  Maximiano Coss, NP

## 2021-09-06 NOTE — Telephone Encounter (Signed)
Patient paperwork has been put in the back sign folder.

## 2021-09-06 NOTE — Telephone Encounter (Signed)
Caller name:Markavious Bourbon   On DPR? :Yes  Call back number:[phone]  Provider they see: Maximiano Coss   Reason for call:FMLA dropped off placed in bin to be completed

## 2021-09-06 NOTE — Addendum Note (Signed)
Addended by: Veneda Melter on: 09/06/2021 03:40 PM   Modules accepted: Orders

## 2021-09-06 NOTE — Patient Instructions (Addendum)
Mr. Vandehei -  Doristine Devoid to see you. Glad you're better.  Let's keep the momentum going with home health.  Repeating CBC and cmp today. I'll call if anything worrisome pops up.  Olin Hauser - drop off the FMLA form at your convenience - [employer] is generally pretty flexible with this stuff! I'll let you all know when it's filled out. We can fax from here or you can come and pick it up.  I have replaced MRI order. I'll CC Dr. Rexene Alberts on the results.  Thank you!  Rich     If you have lab work done today you will be contacted with your lab results within the next 2 weeks.  If you have not heard from [place] then please contact us. The fastest way to get your results is to register for My Chart.   IF you received an x-ray today, you will receive an invoice from [facility]. Please contact [facility] at [phone] with questions or concerns regarding your invoice.   IF you received labwork today, you will receive an invoice from [facility]. Please contact [facility] at [phone] with questions or concerns regarding your invoice.   Our billing staff will not be able to assist you with questions regarding bills from these companies.  You will be contacted with the lab results as soon as they are available. The fastest way to get your results is to activate your My Chart account. Instructions are located on the last page of this paperwork. If you have not heard from [place] regarding the results in 2 weeks, please contact this office.

## 2021-09-07 DIAGNOSIS — I110 Hypertensive heart disease with heart failure: Secondary | ICD-10-CM | POA: Diagnosis not present

## 2021-09-07 DIAGNOSIS — D649 Anemia, unspecified: Secondary | ICD-10-CM | POA: Diagnosis not present

## 2021-09-07 DIAGNOSIS — K219 Gastro-esophageal reflux disease without esophagitis: Secondary | ICD-10-CM | POA: Diagnosis not present

## 2021-09-07 DIAGNOSIS — J9601 Acute respiratory failure with hypoxia: Secondary | ICD-10-CM | POA: Diagnosis not present

## 2021-09-07 DIAGNOSIS — F32A Depression, unspecified: Secondary | ICD-10-CM | POA: Diagnosis not present

## 2021-09-07 DIAGNOSIS — J449 Chronic obstructive pulmonary disease, unspecified: Secondary | ICD-10-CM | POA: Diagnosis not present

## 2021-09-07 DIAGNOSIS — M1990 Unspecified osteoarthritis, unspecified site: Secondary | ICD-10-CM | POA: Diagnosis not present

## 2021-09-07 DIAGNOSIS — I5030 Unspecified diastolic (congestive) heart failure: Secondary | ICD-10-CM | POA: Diagnosis not present

## 2021-09-07 DIAGNOSIS — G4733 Obstructive sleep apnea (adult) (pediatric): Secondary | ICD-10-CM | POA: Diagnosis not present

## 2021-09-07 DIAGNOSIS — G4700 Insomnia, unspecified: Secondary | ICD-10-CM | POA: Diagnosis not present

## 2021-09-07 DIAGNOSIS — I4821 Permanent atrial fibrillation: Secondary | ICD-10-CM | POA: Diagnosis not present

## 2021-09-07 DIAGNOSIS — E785 Hyperlipidemia, unspecified: Secondary | ICD-10-CM | POA: Diagnosis not present

## 2021-09-07 DIAGNOSIS — Z79891 Long term (current) use of opiate analgesic: Secondary | ICD-10-CM | POA: Diagnosis not present

## 2021-09-07 DIAGNOSIS — G2581 Restless legs syndrome: Secondary | ICD-10-CM | POA: Diagnosis not present

## 2021-09-07 DIAGNOSIS — E119 Type 2 diabetes mellitus without complications: Secondary | ICD-10-CM | POA: Diagnosis not present

## 2021-09-07 DIAGNOSIS — I2510 Atherosclerotic heart disease of native coronary artery without angina pectoris: Secondary | ICD-10-CM | POA: Diagnosis not present

## 2021-09-07 DIAGNOSIS — Z7901 Long term (current) use of anticoagulants: Secondary | ICD-10-CM | POA: Diagnosis not present

## 2021-09-07 DIAGNOSIS — F17210 Nicotine dependence, cigarettes, uncomplicated: Secondary | ICD-10-CM | POA: Diagnosis not present

## 2021-09-08 NOTE — Telephone Encounter (Signed)
Shaun [name] called in to check on the FMLA forms.  Please advise

## 2021-09-08 NOTE — Telephone Encounter (Signed)
Her FMLA forms is still with PCP. I will fax them soon as they are completed.

## 2021-09-09 ENCOUNTER — Telehealth: Payer: Self-pay | Admitting: Registered Nurse

## 2021-09-09 ENCOUNTER — Other Ambulatory Visit (HOSPITAL_BASED_OUTPATIENT_CLINIC_OR_DEPARTMENT_OTHER): Payer: Self-pay

## 2021-09-09 DIAGNOSIS — I5030 Unspecified diastolic (congestive) heart failure: Secondary | ICD-10-CM | POA: Diagnosis not present

## 2021-09-09 DIAGNOSIS — Z7901 Long term (current) use of anticoagulants: Secondary | ICD-10-CM | POA: Diagnosis not present

## 2021-09-09 DIAGNOSIS — F17210 Nicotine dependence, cigarettes, uncomplicated: Secondary | ICD-10-CM | POA: Diagnosis not present

## 2021-09-09 DIAGNOSIS — F32A Depression, unspecified: Secondary | ICD-10-CM | POA: Diagnosis not present

## 2021-09-09 DIAGNOSIS — I2510 Atherosclerotic heart disease of native coronary artery without angina pectoris: Secondary | ICD-10-CM | POA: Diagnosis not present

## 2021-09-09 DIAGNOSIS — G4700 Insomnia, unspecified: Secondary | ICD-10-CM | POA: Diagnosis not present

## 2021-09-09 DIAGNOSIS — E785 Hyperlipidemia, unspecified: Secondary | ICD-10-CM | POA: Diagnosis not present

## 2021-09-09 DIAGNOSIS — G4733 Obstructive sleep apnea (adult) (pediatric): Secondary | ICD-10-CM | POA: Diagnosis not present

## 2021-09-09 DIAGNOSIS — M1990 Unspecified osteoarthritis, unspecified site: Secondary | ICD-10-CM | POA: Diagnosis not present

## 2021-09-09 DIAGNOSIS — J9601 Acute respiratory failure with hypoxia: Secondary | ICD-10-CM | POA: Diagnosis not present

## 2021-09-09 DIAGNOSIS — J449 Chronic obstructive pulmonary disease, unspecified: Secondary | ICD-10-CM | POA: Diagnosis not present

## 2021-09-09 DIAGNOSIS — K219 Gastro-esophageal reflux disease without esophagitis: Secondary | ICD-10-CM | POA: Diagnosis not present

## 2021-09-09 DIAGNOSIS — E119 Type 2 diabetes mellitus without complications: Secondary | ICD-10-CM | POA: Diagnosis not present

## 2021-09-09 DIAGNOSIS — I4821 Permanent atrial fibrillation: Secondary | ICD-10-CM | POA: Diagnosis not present

## 2021-09-09 DIAGNOSIS — D649 Anemia, unspecified: Secondary | ICD-10-CM | POA: Diagnosis not present

## 2021-09-09 DIAGNOSIS — Z79891 Long term (current) use of opiate analgesic: Secondary | ICD-10-CM | POA: Diagnosis not present

## 2021-09-09 DIAGNOSIS — G2581 Restless legs syndrome: Secondary | ICD-10-CM | POA: Diagnosis not present

## 2021-09-09 DIAGNOSIS — I110 Hypertensive heart disease with heart failure: Secondary | ICD-10-CM | POA: Diagnosis not present

## 2021-09-09 NOTE — Telephone Encounter (Signed)
Pt reports fasting BG 456 has been taking metformin reports did have fruits and breads before bed last night, please advise any changes. Some recent lab work was done as well

## 2021-09-09 NOTE — Telephone Encounter (Signed)
Raquel Sarna the Home health PT called in stating that pt's blood sugar this morning was 456 fasting. Pt ate fruit and bread last night and has only been taken Metformin.   Please advise if pt needs to do anything different Raquel Sarna states we can call her with the information or call the pt.  Raquel Sarna # [phone]

## 2021-09-10 ENCOUNTER — Other Ambulatory Visit: Payer: Self-pay

## 2021-09-10 ENCOUNTER — Encounter (HOSPITAL_BASED_OUTPATIENT_CLINIC_OR_DEPARTMENT_OTHER): Payer: Self-pay | Admitting: Pharmacist

## 2021-09-10 ENCOUNTER — Other Ambulatory Visit (HOSPITAL_BASED_OUTPATIENT_CLINIC_OR_DEPARTMENT_OTHER): Payer: Self-pay

## 2021-09-10 ENCOUNTER — Other Ambulatory Visit: Payer: Self-pay | Admitting: Registered Nurse

## 2021-09-10 DIAGNOSIS — E1165 Type 2 diabetes mellitus with hyperglycemia: Secondary | ICD-10-CM

## 2021-09-10 MED ORDER — TOUJEO MAX SOLOSTAR 300 UNIT/ML ~~LOC~~ SOPN
12.0000 [IU] | PEN_INJECTOR | Freq: Every evening | SUBCUTANEOUS | 0 refills | Status: DC
  Filled 2021-09-10: qty 3, 56d supply, fill #0

## 2021-09-10 NOTE — Telephone Encounter (Signed)
Called to inform pt he states monitor had gone bad, got new monitor and rechecked and was within range, will call and cancel Rx at the pharmacy.

## 2021-09-10 NOTE — Telephone Encounter (Signed)
Going to send Toujeo to take 12 units nightly. Should be easy to use. Low risk for hypoglycemia. Avoid high glycemic index foods when possible - prefer fruits to breads. Keep an eye on sugars - check 2-3 times daily. Let me know if symptoms arise   Thanks,  Denice Paradise

## 2021-09-13 ENCOUNTER — Telehealth: Payer: Self-pay | Admitting: Registered Nurse

## 2021-09-13 NOTE — Telephone Encounter (Signed)
Glad to hear.  Thanks,  Denice Paradise

## 2021-09-13 NOTE — Telephone Encounter (Signed)
Appears pt was advised to go to [facility], no subsequent notes in pt chart. Would you like to see pt or would you also advise urgent visit?

## 2021-09-13 NOTE — Telephone Encounter (Signed)
Chief Complaint Weakness, Generalized Reason for Call Symptomatic / Request for [place] states husband came out of hospital 1 week ago. His blood pressure, 95/63. He is a diabetic. BS after 120. Feelling weak. [place] Not Listed [facility] Translation No Nurse Assessment Nurse: Lenon Curt, RN, Melanie Date/Time (Eastern Time): 09/11/2021 6:43:24 PM Confirm and document reason for call. If symptomatic, describe symptoms. ---Caller states husband came out of hospital 2 week ago (flu, PNA, acute resp distress - vented - rehab). His blood pressure, 95/63 around 6pm, BS 125. Woke up feeling weak, sluggish all day, voice going in and out, eating and drinking fine.. This am 113/65. Metoprolol BID Does the patient have any new or worsening symptoms? ---Yes Will a triage be completed? ---Yes Related visit to physician within the last 2 weeks? ---Yes Does the PT have any chronic conditions? (i.e. diabetes, asthma, this includes High risk factors for pregnancy, etc.) ---Yes List chronic conditions. ---DM, abilify, water and potassium, eliquis, HTN Is this a behavioral health or substance abuse call? ---No PLEASE NOTE: All timestamps contained within this report are represented as [place] Standard Time. CONFIDENTIALTY NOTICE: This fax transmission is intended only for the addressee. It contains information that is legally privileged, confidential or otherwise protected from use or disclosure. If you are not the intended recipient, you are strictly prohibited from reviewing, disclosing, copying using or disseminating any of this information or taking any action in reliance on or regarding this information. If you have received this fax in error, please notify us immediately by telephone so that we can arrange for its return to [place]. Phone: [phone], Toll-Free: [phone], Fax: [phone] Page: 2 of 2 Call Id: [id number] Guidelines Guideline Title  Affirmed Question Affirmed Notes Nurse Date/Time Eilene Ghazi Time) Blood Pressure - Low [4] Systolic BP 62-703 AND [5] taking blood pressure medications AND [3] dizzy, lightheaded or weak Lenon Curt, RN, Threasa Beards 09/11/2021 6:49:02 PM Disp. Time Eilene Ghazi Time) Disposition Final User 09/11/2021 6:52:45 PM See HCP within 4 Hours (or PCP triage) Yes Lenon Curt, RN, Threasa Beards Caller Disagree/Comply Comply Caller Understands Yes PreDisposition Call Doctor Care Advice Given Per Guideline SEE HCP (OR PCP TRIAGE) WITHIN 4 HOURS: * ED: Patients who may need surgery or hospital admission need to be sent to an ED. So do most patients with serious symptoms or complex medical problems. * UCC: Some UCCs can manage patients who are stable and have less serious symptoms (e.g., minor illnesses and injuries). The triager must know the [facility] capabilities before sending a patient there. If unsure, call ahead. CALL BACK IF: * You become worse CARE ADVICE given per Low Blood Pressure (Adult) guideline. Comments User: Erik Obey, RN Date/Time Eilene Ghazi Time): 09/11/2021 6:51:27 PM 118/68 normal Referrals GO TO FACILITY OTHER - SPECIFY

## 2021-09-13 NOTE — Telephone Encounter (Signed)
Doing some what better, will monitor and go to urgent care or ER if drops again with fatugie/weakness

## 2021-09-13 NOTE — Telephone Encounter (Signed)
Would advise urgent care or ER visit -  [facility] would be good choice if symptoms have persisted.   Thank you  Rich

## 2021-09-14 ENCOUNTER — Encounter: Payer: Medicare Other | Admitting: Physical Medicine & Rehabilitation

## 2021-09-14 DIAGNOSIS — G2581 Restless legs syndrome: Secondary | ICD-10-CM | POA: Diagnosis not present

## 2021-09-14 DIAGNOSIS — J449 Chronic obstructive pulmonary disease, unspecified: Secondary | ICD-10-CM | POA: Diagnosis not present

## 2021-09-14 DIAGNOSIS — Z7901 Long term (current) use of anticoagulants: Secondary | ICD-10-CM | POA: Diagnosis not present

## 2021-09-14 DIAGNOSIS — G4700 Insomnia, unspecified: Secondary | ICD-10-CM | POA: Diagnosis not present

## 2021-09-14 DIAGNOSIS — F17210 Nicotine dependence, cigarettes, uncomplicated: Secondary | ICD-10-CM | POA: Diagnosis not present

## 2021-09-14 DIAGNOSIS — I4821 Permanent atrial fibrillation: Secondary | ICD-10-CM | POA: Diagnosis not present

## 2021-09-14 DIAGNOSIS — J9601 Acute respiratory failure with hypoxia: Secondary | ICD-10-CM | POA: Diagnosis not present

## 2021-09-14 DIAGNOSIS — I110 Hypertensive heart disease with heart failure: Secondary | ICD-10-CM | POA: Diagnosis not present

## 2021-09-14 DIAGNOSIS — G4733 Obstructive sleep apnea (adult) (pediatric): Secondary | ICD-10-CM | POA: Diagnosis not present

## 2021-09-14 DIAGNOSIS — E785 Hyperlipidemia, unspecified: Secondary | ICD-10-CM | POA: Diagnosis not present

## 2021-09-14 DIAGNOSIS — F32A Depression, unspecified: Secondary | ICD-10-CM | POA: Diagnosis not present

## 2021-09-14 DIAGNOSIS — D649 Anemia, unspecified: Secondary | ICD-10-CM | POA: Diagnosis not present

## 2021-09-14 DIAGNOSIS — I2510 Atherosclerotic heart disease of native coronary artery without angina pectoris: Secondary | ICD-10-CM | POA: Diagnosis not present

## 2021-09-14 DIAGNOSIS — E119 Type 2 diabetes mellitus without complications: Secondary | ICD-10-CM | POA: Diagnosis not present

## 2021-09-14 DIAGNOSIS — K219 Gastro-esophageal reflux disease without esophagitis: Secondary | ICD-10-CM | POA: Diagnosis not present

## 2021-09-14 DIAGNOSIS — Z79891 Long term (current) use of opiate analgesic: Secondary | ICD-10-CM | POA: Diagnosis not present

## 2021-09-14 DIAGNOSIS — M1990 Unspecified osteoarthritis, unspecified site: Secondary | ICD-10-CM | POA: Diagnosis not present

## 2021-09-14 DIAGNOSIS — I5030 Unspecified diastolic (congestive) heart failure: Secondary | ICD-10-CM | POA: Diagnosis not present

## 2021-09-16 DIAGNOSIS — E119 Type 2 diabetes mellitus without complications: Secondary | ICD-10-CM | POA: Diagnosis not present

## 2021-09-16 DIAGNOSIS — K219 Gastro-esophageal reflux disease without esophagitis: Secondary | ICD-10-CM | POA: Diagnosis not present

## 2021-09-16 DIAGNOSIS — Z7901 Long term (current) use of anticoagulants: Secondary | ICD-10-CM | POA: Diagnosis not present

## 2021-09-16 DIAGNOSIS — G4733 Obstructive sleep apnea (adult) (pediatric): Secondary | ICD-10-CM | POA: Diagnosis not present

## 2021-09-16 DIAGNOSIS — G4700 Insomnia, unspecified: Secondary | ICD-10-CM | POA: Diagnosis not present

## 2021-09-16 DIAGNOSIS — F32A Depression, unspecified: Secondary | ICD-10-CM | POA: Diagnosis not present

## 2021-09-16 DIAGNOSIS — J449 Chronic obstructive pulmonary disease, unspecified: Secondary | ICD-10-CM | POA: Diagnosis not present

## 2021-09-16 DIAGNOSIS — E785 Hyperlipidemia, unspecified: Secondary | ICD-10-CM | POA: Diagnosis not present

## 2021-09-16 DIAGNOSIS — J9601 Acute respiratory failure with hypoxia: Secondary | ICD-10-CM | POA: Diagnosis not present

## 2021-09-16 DIAGNOSIS — Z79891 Long term (current) use of opiate analgesic: Secondary | ICD-10-CM | POA: Diagnosis not present

## 2021-09-16 DIAGNOSIS — D649 Anemia, unspecified: Secondary | ICD-10-CM | POA: Diagnosis not present

## 2021-09-16 DIAGNOSIS — I2510 Atherosclerotic heart disease of native coronary artery without angina pectoris: Secondary | ICD-10-CM | POA: Diagnosis not present

## 2021-09-16 DIAGNOSIS — I110 Hypertensive heart disease with heart failure: Secondary | ICD-10-CM | POA: Diagnosis not present

## 2021-09-16 DIAGNOSIS — I4821 Permanent atrial fibrillation: Secondary | ICD-10-CM | POA: Diagnosis not present

## 2021-09-16 DIAGNOSIS — I5030 Unspecified diastolic (congestive) heart failure: Secondary | ICD-10-CM | POA: Diagnosis not present

## 2021-09-16 DIAGNOSIS — F17210 Nicotine dependence, cigarettes, uncomplicated: Secondary | ICD-10-CM | POA: Diagnosis not present

## 2021-09-16 DIAGNOSIS — M1990 Unspecified osteoarthritis, unspecified site: Secondary | ICD-10-CM | POA: Diagnosis not present

## 2021-09-16 DIAGNOSIS — G2581 Restless legs syndrome: Secondary | ICD-10-CM | POA: Diagnosis not present

## 2021-09-17 ENCOUNTER — Ambulatory Visit
Admission: RE | Admit: 2021-09-17 | Discharge: 2021-09-17 | Disposition: A | Discharge status: Home / Self Care | Payer: Medicare Other | Source: Ambulatory Visit | Attending: Registered Nurse | Admitting: Registered Nurse

## 2021-09-17 ENCOUNTER — Other Ambulatory Visit: Payer: Self-pay

## 2021-09-17 DIAGNOSIS — R270 Ataxia, unspecified: Secondary | ICD-10-CM

## 2021-09-17 MED ORDER — GADOBENATE DIMEGLUMINE 529 MG/ML IV SOLN
19.0000 mL | Freq: Once | INTRAVENOUS | Status: AC | PRN
  Administered 2021-09-17: 19 mL via INTRAVENOUS

## 2021-09-21 DIAGNOSIS — D649 Anemia, unspecified: Secondary | ICD-10-CM | POA: Diagnosis not present

## 2021-09-21 DIAGNOSIS — F17210 Nicotine dependence, cigarettes, uncomplicated: Secondary | ICD-10-CM | POA: Diagnosis not present

## 2021-09-21 DIAGNOSIS — I2510 Atherosclerotic heart disease of native coronary artery without angina pectoris: Secondary | ICD-10-CM | POA: Diagnosis not present

## 2021-09-21 DIAGNOSIS — J9601 Acute respiratory failure with hypoxia: Secondary | ICD-10-CM | POA: Diagnosis not present

## 2021-09-21 DIAGNOSIS — K219 Gastro-esophageal reflux disease without esophagitis: Secondary | ICD-10-CM | POA: Diagnosis not present

## 2021-09-21 DIAGNOSIS — F32A Depression, unspecified: Secondary | ICD-10-CM | POA: Diagnosis not present

## 2021-09-21 DIAGNOSIS — G4733 Obstructive sleep apnea (adult) (pediatric): Secondary | ICD-10-CM | POA: Diagnosis not present

## 2021-09-21 DIAGNOSIS — I110 Hypertensive heart disease with heart failure: Secondary | ICD-10-CM | POA: Diagnosis not present

## 2021-09-21 DIAGNOSIS — M1990 Unspecified osteoarthritis, unspecified site: Secondary | ICD-10-CM | POA: Diagnosis not present

## 2021-09-21 DIAGNOSIS — I5030 Unspecified diastolic (congestive) heart failure: Secondary | ICD-10-CM | POA: Diagnosis not present

## 2021-09-21 DIAGNOSIS — Z7901 Long term (current) use of anticoagulants: Secondary | ICD-10-CM | POA: Diagnosis not present

## 2021-09-21 DIAGNOSIS — G4700 Insomnia, unspecified: Secondary | ICD-10-CM | POA: Diagnosis not present

## 2021-09-21 DIAGNOSIS — J449 Chronic obstructive pulmonary disease, unspecified: Secondary | ICD-10-CM | POA: Diagnosis not present

## 2021-09-21 DIAGNOSIS — E785 Hyperlipidemia, unspecified: Secondary | ICD-10-CM | POA: Diagnosis not present

## 2021-09-21 DIAGNOSIS — I4821 Permanent atrial fibrillation: Secondary | ICD-10-CM | POA: Diagnosis not present

## 2021-09-21 DIAGNOSIS — E119 Type 2 diabetes mellitus without complications: Secondary | ICD-10-CM | POA: Diagnosis not present

## 2021-09-21 DIAGNOSIS — Z79891 Long term (current) use of opiate analgesic: Secondary | ICD-10-CM | POA: Diagnosis not present

## 2021-09-21 DIAGNOSIS — G2581 Restless legs syndrome: Secondary | ICD-10-CM | POA: Diagnosis not present

## 2021-09-22 ENCOUNTER — Ambulatory Visit (INDEPENDENT_AMBULATORY_CARE_PROVIDER_SITE_OTHER): Payer: Medicare Other

## 2021-09-22 DIAGNOSIS — I5032 Chronic diastolic (congestive) heart failure: Secondary | ICD-10-CM

## 2021-09-22 DIAGNOSIS — I482 Chronic atrial fibrillation, unspecified: Secondary | ICD-10-CM

## 2021-09-22 DIAGNOSIS — J439 Emphysema, unspecified: Secondary | ICD-10-CM

## 2021-09-22 DIAGNOSIS — E785 Hyperlipidemia, unspecified: Secondary | ICD-10-CM

## 2021-09-22 DIAGNOSIS — I2510 Atherosclerotic heart disease of native coronary artery without angina pectoris: Secondary | ICD-10-CM

## 2021-09-22 DIAGNOSIS — E1165 Type 2 diabetes mellitus with hyperglycemia: Secondary | ICD-10-CM

## 2021-09-22 NOTE — Chronic Care Management (AMB) (Signed)
Chronic Care Management   CCM RN Visit Note  09/22/2021 Name: Shaun [name] MRN: [id number] DOB: February 19, 1956  Subjective: Fortunato Curling Gordin is a 66 y.o. year old male who is a primary care patient of Maximiano Coss, NP. The care management team was consulted for assistance with disease management and care coordination needs.    Engaged with patient by telephone for initial visit in response to provider referral for case management and/or care coordination services.   Consent to Services:  The patient was given the following information about Chronic Care Management services today, agreed to services, and gave verbal consent: 1. CCM service includes personalized support from designated clinical staff supervised by the primary care provider, including individualized plan of care and coordination with other care providers 2. 24/7 contact phone numbers for assistance for urgent and routine care needs. 3. Service will only be billed when office clinical staff spend 20 minutes or more in a month to coordinate care. 4. Only one practitioner may furnish and bill the service in a calendar month. 5.The patient may stop CCM services at any time (effective at the end of the month) by phone call to the office staff. 6. The patient will be responsible for cost sharing (co-pay) of up to 20% of the service fee (after annual deductible is met). Patient agreed to services and consent obtained.  Patient agreed to services and verbal consent obtained.   Assessment: Review of patient past medical history, allergies, medications, health status, including review of consultants reports, laboratory and other test data, was performed as part of comprehensive evaluation and provision of chronic care management services.   SDOH (Social Determinants of Health) assessments and interventions performed:  SDOH Interventions    Flowsheet Row Most Recent Value  SDOH Interventions   Food Insecurity Interventions  Intervention Not Indicated  Financial Strain Interventions Intervention Not Indicated  Housing Interventions Intervention Not Indicated  Stress Interventions Intervention Not Indicated  Transportation Interventions Intervention Not Indicated  Depression Interventions/Treatment  Currently on Treatment, Medication        CCM Care Plan  No Known Allergies  Outpatient Encounter Medications as of 09/22/2021  Medication Sig   acetaminophen (TYLENOL) 325 MG tablet Take 2 tablets (650 mg total) by mouth every 6 (six) hours as needed for mild pain.   albuterol (VENTOLIN HFA) 108 (90 Base) MCG/ACT inhaler INHALE 2 PUFFS INTO THE LUNGS EVERY 6 HOURS AS NEEDED FOR WHEEZING OR SHORTNESS OF BREATH   apixaban (ELIQUIS) 5 MG TABS tablet TAKE 1 TABLET (5 MG) BY MOUTH TWICE DAILY   ARIPiprazole (ABILIFY) 2 MG tablet Take 1 tablet (2 mg total) by mouth daily.   atorvastatin (LIPITOR) 80 MG tablet Take 1 tablet (80 mg total) by mouth daily. TAKE 1 TABLET(80 MG) BY MOUTH EVERY EVENING   docusate sodium (COLACE) 100 MG capsule Take 100 mg by mouth daily as needed for mild constipation.   escitalopram (LEXAPRO) 20 MG tablet TAKE 1 TABLET(20 MG) BY MOUTH DAILY   ezetimibe (ZETIA) 10 MG tablet TAKE 1 TABLET BY MOUTH EVERY DAY   ferrous sulfate 325 (65 FE) MG tablet Take 325 mg by mouth daily with breakfast.   furosemide (LASIX) 40 MG tablet Take 1 tablet (40 mg total) by mouth daily.   magnesium gluconate (MAGONATE) 500 MG tablet Take 0.5 tablets (250 mg total) by mouth at bedtime.   metFORMIN (GLUCOPHAGE) 500 MG tablet TAKE 1 TABLET (500 MG) BY MOUTH TWICE DAILY WITH A MEAL.   Metoprolol Tartrate 75  MG TABS Take 75 mg by mouth 2 (two) times daily.   nitroGLYCERIN (NITROSTAT) 0.4 MG SL tablet Place 1 tablet (0.4 mg total) under the tongue every 5 (five) minutes as needed.   omeprazole (PRILOSEC) 40 MG capsule Take 1 capsule by mouth once daily 30 minutes before breakfast   potassium chloride SA (KLOR-CON) 20  MEQ tablet Take 1 tablet (20 mEq total) by mouth daily.   rOPINIRole (REQUIP) 3 MG tablet TAKE 1 TABLET BY MOUTH EVERY NIGHT AT BEDTIME   sucralfate (CARAFATE) 1 g tablet Take one tablet po BID PRN (Patient taking differently: Take 1 g by mouth 2 (two) times daily as needed (acid reflux).)   traMADol (ULTRAM) 50 MG tablet Take 1 tablet (50 mg total) by mouth every 6 (six) hours as needed for moderate pain or severe pain. You should have 20 day supply of medication at home.   traZODone (DESYREL) 50 MG tablet TAKE 2 TABLETS (100 MG) BY MOUTH AT BEDTIME   No facility-administered encounter medications on file as of 09/22/2021.    Patient Active Problem List   Diagnosis Date Noted   Acute respiratory failure ([place]) 08/25/2021   Sundowning 08/23/2021   Dysphagia 08/22/2021   Hypernatremia 08/22/2021   Obesity (BMI 30-39.9) 08/21/2021   Endotracheally intubated    Influenza    Acute respiratory failure with hypoxemia ([place]) 08/06/2021   Ventral hernia 12/31/2020   Nausea with vomiting 12/31/2019   Dyslipidemia, goal LDL below 70 06/18/2019   Hx of adenomatous colonic polyps 10/17/2018   Anemia 10/17/2018   Phimosis of penis 09/27/2018   Osteoarthritis of right hip 06/20/2018   Esophageal dysphagia 05/21/2018   GERD (gastroesophageal reflux disease) 05/21/2018   RLS (restless legs syndrome)    PUD (peptic ulcer disease)    Insomnia    Chronic diastolic CHF (congestive heart failure) ([place])    Asthma    Arteriosclerotic cardiovascular disease (ASCVD)    Chronic anticoagulation 01/27/2017   Depression 09/24/2015   COPD (chronic obstructive pulmonary disease) ([place]) 07/20/2015   Accelerating angina ([place]) 07/19/2015   Genetic testing 09/09/2014   H/O adenomatous polyp of colon 08/11/2014   Non-insulin treated type 2 diabetes mellitus ([place]) 02/09/2011   CAD S/P percutaneous coronary angioplasty    Tobacco abuse    Essential hypertension    Chronic atrial fibrillation ([place]) 02/23/2010    Obstructive sleep apnea 12/27/2009    Conditions to be addressed/monitored:Atrial Fibrillation, CHF, CAD, HTN, HLD, COPD, DMII, and Depression  Care Plan : RN Care Manager Plan of Care  Updates made by Dimitri Ped, RN since 09/22/2021 12:00 AM     Problem: Chronic Disease Management and Care Coordination Needs (DM,CHF, CAD, HTN, Atrial Fib,COPD, depression, HLD)   Priority: High     Long-Range Goal: Establish Plan of Care for Chronic Disease Management Needs (DM,CHF, CAD, HTN, Atrial Fib,COPD, depression, HLD)   Start Date: 09/22/2021  Expected End Date: 09/19/2022  Priority: High  Note:   Current Barriers:  Knowledge Deficits related to plan of care for management of Atrial Fibrillation, CHF, CAD, HTN, HLD, COPD, DMII, and Depression  Chronic Disease Management support and education needs related to Atrial Fibrillation, CHF, CAD, HTN, HLD, COPD, DMII, and Depression  No Advanced Directives in place States that he is getting stronger as time goes by.  States he is getting home health PT twice a week.  States he is trying to walk and do the exercises they have taught him.  States his CBGs have ranged  120-140 most days.  Denies any low readings.  States he is weighting daily.  Denies any chest pains or swelling.  States he gets winded if he walks too fast and thinks it is from his A Fib not his lungs.  Denies any coughing or choking. States his mood is getting better as he gets stronger.  States he is trying to eat healthy and follows a low sodium diet.  STAtes his B/P has been on the lower side and was 110/70 today but has been down to 92/60.  States he did feel  more tired when his B/P was lower  RNCM Clinical Goal(s):  Patient will verbalize understanding of plan for management of Atrial Fibrillation, CHF, CAD, HTN, HLD, COPD, DMII, and Depression as evidenced by voiced adherence to plan of care verbalize basic understanding of  Atrial Fibrillation, CHF, CAD, HTN, HLD, COPD, DMII, and  Depression disease process and self health management plan as evidenced by voiced understanding and teach back take all medications exactly as prescribed and will call provider for medication related questions as evidenced by dispense report and pt verbalization attend all scheduled medical appointments: Cardiology 09/23/21, Kathrin Ruddy NP 10/20/21, Dr. Letta Pate 10/21/21 as evidenced by medical records demonstrate Improved adherence to prescribed treatment plan for Atrial Fibrillation, CHF, CAD, HTN, HLD, COPD, DMII, and Depression as evidenced by readings within limits, voiced adherence to plan of care continue to work with RN Care Manager to address care management and care coordination needs related to  Atrial Fibrillation, CHF, CAD, HTN, HLD, COPD, DMII, and Depression as evidenced by adherence to CM Team Scheduled appointments through collaboration with RN Care manager, provider, and care team.   Interventions: 1:1 collaboration with primary care provider regarding development and update of comprehensive plan of care as evidenced by provider attestation and co-signature Inter-disciplinary care team collaboration (see longitudinal plan of care) Evaluation of current treatment plan related to  self management and patient's adherence to plan as established by provider   AFIB Interventions: (Status:  New goal.) Long Term Goal   Counseled on increased risk of stroke due to Afib and benefits of anticoagulation for stroke prevention Reviewed importance of adherence to anticoagulant exactly as prescribed Counseled on bleeding risk associated with Eliquis and importance of self-monitoring for signs/symptoms of bleeding Counseled on avoidance of NSAIDs due to increased bleeding risk with anticoagulants Afib action plan reviewed   CAD Interventions: (Status:  New goal.) Long Term Goal Assessed understanding of CAD diagnosis Medications reviewed including medications utilized in CAD treatment  plan Provided education on importance of blood pressure control in management of CAD Provided education on Importance of limiting foods high in cholesterol Reviewed Importance of taking all medications as prescribed Reviewed to continue to remain tobacco free   Heart Failure Interventions:  (Status:  New goal.) Long Term Goal Basic overview and discussion of pathophysiology of Heart Failure reviewed Provided education on low sodium diet Reviewed Heart Failure Action Plan in depth and provided written copy Discussed importance of daily weight and advised patient to weigh and record daily Reviewed role of diuretics in prevention of fluid overload and management of heart failure; Discussed the importance of keeping all appointments with provider Screening for signs and symptoms of depression related to chronic disease state  Assessed social determinant of health barriers  Mailed Advanced Directive packet and HF packet  COPD Interventions:  (Status:  New goal.) Long Term Goal Advised patient to track and manage COPD triggers Provided instruction about proper use of  medications used for management of COPD including inhalers   Diabetes Interventions:  (Status:  New goal.) Long Term Goal Assessed patient's understanding of A1c goal: <7% Provided education to patient about basic DM disease process Reviewed medications with patient and discussed importance of medication adherence Discussed plans with patient for ongoing care management follow up and provided patient with direct contact information for care management team Provided patient with written educational materials related to hypo and hyperglycemia and importance of correct treatment Advised patient, providing education and rationale, to check cbg daily and record, calling provider for findings outside established parameters Lab Results  Component Value Date   HGBA1C 6.9 (H) 08/07/2021   Hyperlipidemia Interventions:  (Status:  New  goal.) Long Term Goal Medication review performed; medication list updated in electronic medical record.  Provider established cholesterol goals reviewed Counseled on importance of regular laboratory monitoring as prescribed Reviewed role and benefits of statin for ASCVD risk reduction  Hypertension Interventions:  (Status:  New goal.) Long Term Goal Last practice recorded BP readings:  BP Readings from Last 3 Encounters:  09/06/21 102/63  08/30/21 126/80  08/25/21 101/63  Most recent eGFR/CrCl:  Lab Results  Component Value Date   EGFR 96 06/28/2021    No components found for: CRCL  Evaluation of current treatment plan related to hypertension self management and patient's adherence to plan as established by provider Provided education to patient re: stroke prevention, s/s of heart attack and stroke Discussed plans with patient for ongoing care management follow up and provided patient with direct contact information for care management team Advised patient, providing education and rationale, to monitor blood pressure daily and record, calling PCP for findings outside established parameters Provided education on prescribed diet low sodium low CHO heart healthy  Patient Goals/Self-Care Activities: Take all medications as prescribed Attend all scheduled provider appointments Perform all self care activities independently  Call provider office for new concerns or questions  call office if I gain more than 2 pounds in one day or 5 pounds in one week keep legs up while sitting watch for swelling in feet, ankles and legs every day weigh myself daily follow rescue plan if symptoms flare-up keep appointment with eye doctor check blood sugar at prescribed times: once daily and when you have symptoms of low or high blood sugar check feet daily for cuts, sores or redness take the blood sugar log to all doctor visits drink 6 to 8 glasses of water each day fill half of plate with  vegetables manage portion size switch to sugar-free drinks eliminate smoking in my home identify and remove indoor air pollutants limit outdoor activity during cold weather eliminate symptom triggers at home check pulse (heart) rate once a day make a plan to exercise regularly make a plan to eat healthy check blood pressure daily choose a place to take my blood pressure (home, clinic or office, retail store) take blood pressure log to all doctor appointments take medications for blood pressure exactly as prescribed report new symptoms to your doctor eat more whole grains, fruits and vegetables, lean meats and healthy fats limit salt intake to 2345m/day call for medicine refill 2 or 3 days before it runs out take all medications exactly as prescribed call doctor with any symptoms you believe are related to your medicine  Follow Up Plan:  Telephone follow up appointment with care management team member scheduled for:  10/27/21 The patient has been provided with contact information for the care management team and has  been advised to call with any health related questions or concerns.       Plan:Telephone follow up appointment with care management team member scheduled for:  10/27/21 The patient has been provided with contact information for the care management team and has been advised to call with any health related questions or concerns.  Peter Garter RN, Jackquline Denmark, CDE Care Management Coordinator [facility] [phone]

## 2021-09-22 NOTE — Patient Instructions (Signed)
Visit Information   Thank you for taking time to visit with me today. Please don't hesitate to contact me if I can be of assistance to you before our next scheduled telephone appointment.  Following are the goals we discussed today:  Take all medications as prescribed Attend all scheduled provider appointments Perform all self care activities independently  Call provider office for new concerns or questions  call office if I gain more than 2 pounds in one day or 5 pounds in one week keep legs up while sitting watch for swelling in feet, ankles and legs every day weigh myself daily follow rescue plan if symptoms flare-up keep appointment with eye doctor check blood sugar at prescribed times: once daily and when you have symptoms of low or high blood sugar check feet daily for cuts, sores or redness take the blood sugar log to all doctor visits drink 6 to 8 glasses of water each day fill half of plate with vegetables manage portion size switch to sugar-free drinks eliminate smoking in my home identify and remove indoor air pollutants limit outdoor activity during cold weather eliminate symptom triggers at home check pulse (heart) rate once a day make a plan to exercise regularly make a plan to eat healthy check blood pressure daily choose a place to take my blood pressure (home, clinic or office, retail store) take blood pressure log to all doctor appointments take medications for blood pressure exactly as prescribed report new symptoms to your doctor eat more whole grains, fruits and vegetables, lean meats and healthy fats limit salt intake to 2371m/day call for medicine refill 2 or 3 days before it runs out take all medications exactly as prescribed call doctor with any symptoms you believe are related to your medicine Living With Diabetes Diabetes (type 1 diabetes mellitus or type 2 diabetes mellitus) is a condition in which the body does not have enough of a hormone called  insulin, or the body does not respond properly to insulin. Normally, insulin allows sugars (glucose) to enter cells in the body. With diabetes, extra glucose builds up in the blood instead of going into cells. This results in high blood glucose (hyperglycemia). How to manage lifestyle changes Managing diabetes includes medical treatments as well as lifestyle changes. If diabetes is not managed well, serious physical and emotional complications can occur. Taking good care of yourself means that you are responsible for: Monitoring glucose regularly. Eating a healthy diet. Exercising regularly. Meeting with health care providers. Taking medicines as directed. Most people feel some stress about managing their diabetes. When this stress becomes too much, it is known as diabetes-related distress. This is very common. Living with diabetes can place you at risk for diabetes distress, depression, or anxiety. These disorders can make diabetes more difficult to manage. How to recognize stress You may have diabetes distress if you: Avoid or ignore your daily diabetes care. This includes glucose testing, following a meal plan, and taking medications. Feel overwhelmed by your daily diabetes care. Experience emotional reactions such as anger, sadness, or fear related to your daily diabetes care. Feel fear or shame about not doing everything perfectly that you have been told to do. Emotional distress Symptoms of diabetes distress include: Anger about having a diagnosis of diabetes. Fear or frustration about your diagnosis and the changes you need to make to manage the condition. Being overly worried about the care that you need or the cost of the care that you need. Feeling like you caused your condition  by doing something wrong. Fear about unpredictable fluctuations in your blood glucose, like low or high blood glucose. Feeling judged by your health care providers. Feeling very alone with the  disease. Depression Having diabetes means that you are at a higher risk for depression. Your health care provider may test (screen) you for symptoms of depression. It is important to recognize symptoms and to start treatment for depression soon after it is diagnosed. The following are some symptoms of depression: Loss of interest in things that you used to enjoy. Feeling depressed much or most of the time. A change in appetite. Trouble getting to sleep or staying asleep. Feeling tired most of the day. Feeling nervous and anxious. Feeling guilty and worrying that you are a burden to others. Having thoughts of hurting yourself or feeling that you want to die. If you have any of these symptoms, more days than not, for 2 weeks or longer, you may have depression. This would be a good time to contact your health care provider. Follow these instructions at home: Managing diabetes distress The following are some ways to manage emotional distress: Learn as much as you can about diabetes and its treatment. Take one step at a time to improve your management. Meet with a certified diabetes care and education specialist. Take a class to learn how to manage your condition. Consider working with a counselor or therapist. Keep a journal of your thoughts and concerns. Accept that some things are out of your control. Talk with other people who have diabetes. It can help to talk about the distress that you feel. Find ways to manage stress that work for you. These may include art or music therapy, exercise, meditation, and hobbies. Seek support from spiritual leaders, family, and friends.  General instructions Do your best to follow your diabetes management plan. If you are struggling to follow your plan, talk with a certified diabetes care and education specialist, or with someone else who has diabetes. They may have ideas that will help. Forgive yourself for not being perfect. Almost everyone struggles with  the tasks of diabetes. Keep all follow-up visits. This is important. Where to find support Search for information and support from the American Diabetes Association: www.diabetes.org Find a certified diabetes education and care specialist. Make an appointment through the Association of Diabetes Care & Education Specialists: www.diabeteseducator.org Contact a health care provider if: You believe your diabetes is getting out of control. You are concerned you may be depressed. You think your medications are not helping control your diabetes. You are feeling overwhelmed with your diabetes. Get help right away if: You have thoughts about hurting yourself or others. If you ever feel like you may hurt yourself or others, or have thoughts about taking your own life, get help right away. You can go to your nearest emergency department or call: Your local emergency services (911 in the [place] A suicide crisis helpline, such as the [facility] at 8152148403 or 988 in the [place]. This is open 24 hours a day. Summary Diabetes (type 1 diabetes mellitus or type 2 diabetes mellitus) is a condition in which the body does not have enough of a hormone called insulin, or the body does not respond properly to insulin. Living with diabetes puts you at risk for medical and emotional issues, such as diabetes distress, depression, and anxiety. Recognizing the symptoms of diabetes distress and depression may help you avoid problems with your diabetes control. If you experience symptoms, it is important  to discuss this with your health care provider, certified diabetes care and education specialist, or therapist. It is important to start treatment for diabetes distress and depression soon after diagnosis. Ask your health care provider to recommend a therapist who understands both depression and diabetes. This information is not intended to replace advice given to you by your health care provider.  Make sure you discuss any questions you have with your health care provider. Document Revised: 03/10/2021 Document Reviewed: 12/26/2019 Elsevier Patient Education  2022 [place] next appointment is by telephone on 10/27/21 at 2:15 PM  Please call the care guide team at [phone] if you need to cancel or reschedule your appointment.   If you are experiencing a Mental Health or [place] or need someone to talk to, please call the Suicide and Crisis Lifeline: 988 call the [place] National Suicide Prevention Lifeline: 808-179-3661 or TTY: 416-415-6162 TTY (458)620-9384) to talk to a trained counselor call 1-800-273-TALK (toll free, 24 hour hotline) call 911   Following is a copy of your full care plan:  Care Plan : [place] of Care  Updates made by Dimitri Ped, RN since 09/22/2021 12:00 AM     Problem: Chronic Disease Management and Care Coordination Needs (DM,CHF, CAD, HTN, Atrial Fib,COPD, depression, HLD)   Priority: High     Long-Range Goal: Establish Plan of Care for Chronic Disease Management Needs (DM,CHF, CAD, HTN, Atrial Fib,COPD, depression, HLD)   Start Date: 09/22/2021  Expected End Date: 09/19/2022  Priority: High  Note:   Current Barriers:  Knowledge Deficits related to plan of care for management of Atrial Fibrillation, CHF, CAD, HTN, HLD, COPD, DMII, and Depression  Chronic Disease Management support and education needs related to Atrial Fibrillation, CHF, CAD, HTN, HLD, COPD, DMII, and Depression  No Advanced Directives in place States that he is getting stronger as time goes by.  States he is getting home health PT twice a week.  States he is trying to walk and do the exercises they have taught him.  States his CBGs have ranged 120-140 most days.  Denies any low readings.  States he is weighting daily.  Denies any chest pains or swelling.  States he gets winded if he walks too fast and thinks it is from his A Fib not his lungs.   Denies any coughing or choking. States his mood is getting better as he gets stronger.  States he is trying to eat healthy and follows a low sodium diet.  STAtes his B/P has been on the lower side and was 110/70 today but has been down to 92/60.  States he did feel  more tired when his B/P was lower  RNCM Clinical Goal(s):  Patient will verbalize understanding of plan for management of Atrial Fibrillation, CHF, CAD, HTN, HLD, COPD, DMII, and Depression as evidenced by voiced adherence to plan of care verbalize basic understanding of  Atrial Fibrillation, CHF, CAD, HTN, HLD, COPD, DMII, and Depression disease process and self health management plan as evidenced by voiced understanding and teach back take all medications exactly as prescribed and will call provider for medication related questions as evidenced by dispense report and pt verbalization attend all scheduled medical appointments: Cardiology 09/23/21, Kathrin Ruddy NP 10/20/21, Dr. Letta Pate 10/21/21 as evidenced by medical records demonstrate Improved adherence to prescribed treatment plan for Atrial Fibrillation, CHF, CAD, HTN, HLD, COPD, DMII, and Depression as evidenced by readings within limits, voiced adherence to plan of care continue  to work with RN Care Manager to address care management and care coordination needs related to  Atrial Fibrillation, CHF, CAD, HTN, HLD, COPD, DMII, and Depression as evidenced by adherence to CM Team Scheduled appointments through collaboration with RN Care manager, provider, and care team.   Interventions: 1:1 collaboration with primary care provider regarding development and update of comprehensive plan of care as evidenced by provider attestation and co-signature Inter-disciplinary care team collaboration (see longitudinal plan of care) Evaluation of current treatment plan related to  self management and patient's adherence to plan as established by provider   AFIB Interventions: (Status:  New goal.) Long  Term Goal   Counseled on increased risk of stroke due to Afib and benefits of anticoagulation for stroke prevention Reviewed importance of adherence to anticoagulant exactly as prescribed Counseled on bleeding risk associated with Eliquis and importance of self-monitoring for signs/symptoms of bleeding Counseled on avoidance of NSAIDs due to increased bleeding risk with anticoagulants Afib action plan reviewed   CAD Interventions: (Status:  New goal.) Long Term Goal Assessed understanding of CAD diagnosis Medications reviewed including medications utilized in CAD treatment plan Provided education on importance of blood pressure control in management of CAD Provided education on Importance of limiting foods high in cholesterol Reviewed Importance of taking all medications as prescribed Reviewed to continue to remain tobacco free   Heart Failure Interventions:  (Status:  New goal.) Long Term Goal Basic overview and discussion of pathophysiology of Heart Failure reviewed Provided education on low sodium diet Reviewed Heart Failure Action Plan in depth and provided written copy Discussed importance of daily weight and advised patient to weigh and record daily Reviewed role of diuretics in prevention of fluid overload and management of heart failure; Discussed the importance of keeping all appointments with provider Screening for signs and symptoms of depression related to chronic disease state  Assessed social determinant of health barriers  Mailed Advanced Directive packet and HF packet  COPD Interventions:  (Status:  New goal.) Long Term Goal Advised patient to track and manage COPD triggers Provided instruction about proper use of medications used for management of COPD including inhalers   Diabetes Interventions:  (Status:  New goal.) Long Term Goal Assessed patient's understanding of A1c goal: <7% Provided education to patient about basic DM disease process Reviewed medications  with patient and discussed importance of medication adherence Discussed plans with patient for ongoing care management follow up and provided patient with direct contact information for care management team Provided patient with written educational materials related to hypo and hyperglycemia and importance of correct treatment Advised patient, providing education and rationale, to check cbg daily and record, calling provider for findings outside established parameters Lab Results  Component Value Date   HGBA1C 6.9 (H) 08/07/2021   Hyperlipidemia Interventions:  (Status:  New goal.) Long Term Goal Medication review performed; medication list updated in electronic medical record.  Provider established cholesterol goals reviewed Counseled on importance of regular laboratory monitoring as prescribed Reviewed role and benefits of statin for ASCVD risk reduction  Hypertension Interventions:  (Status:  New goal.) Long Term Goal Last practice recorded BP readings:  BP Readings from Last 3 Encounters:  09/06/21 102/63  08/30/21 126/80  08/25/21 101/63  Most recent eGFR/CrCl:  Lab Results  Component Value Date   EGFR 96 06/28/2021    No components found for: CRCL  Evaluation of current treatment plan related to hypertension self management and patient's adherence to plan as established by provider Provided education to  patient re: stroke prevention, s/s of heart attack and stroke Discussed plans with patient for ongoing care management follow up and provided patient with direct contact information for care management team Advised patient, providing education and rationale, to monitor blood pressure daily and record, calling PCP for findings outside established parameters Provided education on prescribed diet low sodium low CHO heart healthy  Patient Goals/Self-Care Activities: Take all medications as prescribed Attend all scheduled provider appointments Perform all self care activities  independently  Call provider office for new concerns or questions  call office if I gain more than 2 pounds in one day or 5 pounds in one week keep legs up while sitting watch for swelling in feet, ankles and legs every day weigh myself daily follow rescue plan if symptoms flare-up keep appointment with eye doctor check blood sugar at prescribed times: once daily and when you have symptoms of low or high blood sugar check feet daily for cuts, sores or redness take the blood sugar log to all doctor visits drink 6 to 8 glasses of water each day fill half of plate with vegetables manage portion size switch to sugar-free drinks eliminate smoking in my home identify and remove indoor air pollutants limit outdoor activity during cold weather eliminate symptom triggers at home check pulse (heart) rate once a day make a plan to exercise regularly make a plan to eat healthy check blood pressure daily choose a place to take my blood pressure (home, clinic or office, retail store) take blood pressure log to all doctor appointments take medications for blood pressure exactly as prescribed report new symptoms to your doctor eat more whole grains, fruits and vegetables, lean meats and healthy fats limit salt intake to 2373m/day call for medicine refill 2 or 3 days before it runs out take all medications exactly as prescribed call doctor with any symptoms you believe are related to your medicine  Follow Up Plan:  Telephone follow up appointment with care management team member scheduled for:  10/27/21 The patient has been provided with contact information for the care management team and has been advised to call with any health related questions or concerns.       Consent to CCM Services: Mr. PMcquarriewas given information about Chronic Care Management services including:  CCM service includes personalized support from designated clinical staff supervised by his physician, including  individualized plan of care and coordination with other care providers 24/7 contact phone numbers for assistance for urgent and routine care needs. Service will only be billed when office clinical staff spend 20 minutes or more in a month to coordinate care. Only one practitioner may furnish and bill the service in a calendar month. The patient may stop CCM services at any time (effective at the end of the month) by phone call to the office staff. The patient will be responsible for cost sharing (co-pay) of up to 20% of the service fee (after annual deductible is met).  Patient agreed to services and verbal consent obtained.   Patient verbalizes understanding of instructions and care plan provided today and agrees to view in [place] My[facility] status confirmed with patient.    Telephone follow up appointment with care management team member scheduled for: 10/27/21 at 2:15 PM MPeter GarterRN, BJackquline Denmark CDE Care Management Coordinator [facility] ([phone]

## 2021-09-23 ENCOUNTER — Other Ambulatory Visit: Payer: Self-pay

## 2021-09-23 ENCOUNTER — Ambulatory Visit (HOSPITAL_BASED_OUTPATIENT_CLINIC_OR_DEPARTMENT_OTHER): Payer: Medicare Other | Admitting: Family

## 2021-09-23 ENCOUNTER — Encounter (HOSPITAL_BASED_OUTPATIENT_CLINIC_OR_DEPARTMENT_OTHER): Payer: Self-pay | Admitting: Family

## 2021-09-23 VITALS — BP 110/80 | HR 93 | Ht 67.0 in | Wt 202.6 lb

## 2021-09-23 DIAGNOSIS — G2581 Restless legs syndrome: Secondary | ICD-10-CM | POA: Diagnosis not present

## 2021-09-23 DIAGNOSIS — I10 Essential (primary) hypertension: Secondary | ICD-10-CM | POA: Diagnosis not present

## 2021-09-23 DIAGNOSIS — I2510 Atherosclerotic heart disease of native coronary artery without angina pectoris: Secondary | ICD-10-CM | POA: Diagnosis not present

## 2021-09-23 DIAGNOSIS — F32A Depression, unspecified: Secondary | ICD-10-CM | POA: Diagnosis not present

## 2021-09-23 DIAGNOSIS — D649 Anemia, unspecified: Secondary | ICD-10-CM | POA: Diagnosis not present

## 2021-09-23 DIAGNOSIS — E782 Mixed hyperlipidemia: Secondary | ICD-10-CM

## 2021-09-23 DIAGNOSIS — E119 Type 2 diabetes mellitus without complications: Secondary | ICD-10-CM | POA: Diagnosis not present

## 2021-09-23 DIAGNOSIS — E785 Hyperlipidemia, unspecified: Secondary | ICD-10-CM

## 2021-09-23 DIAGNOSIS — I5032 Chronic diastolic (congestive) heart failure: Secondary | ICD-10-CM | POA: Diagnosis not present

## 2021-09-23 DIAGNOSIS — I4821 Permanent atrial fibrillation: Secondary | ICD-10-CM | POA: Diagnosis not present

## 2021-09-23 DIAGNOSIS — D6859 Other primary thrombophilia: Secondary | ICD-10-CM

## 2021-09-23 DIAGNOSIS — I25118 Atherosclerotic heart disease of native coronary artery with other forms of angina pectoris: Secondary | ICD-10-CM

## 2021-09-23 DIAGNOSIS — I110 Hypertensive heart disease with heart failure: Secondary | ICD-10-CM | POA: Diagnosis not present

## 2021-09-23 DIAGNOSIS — J9601 Acute respiratory failure with hypoxia: Secondary | ICD-10-CM | POA: Diagnosis not present

## 2021-09-23 DIAGNOSIS — G4733 Obstructive sleep apnea (adult) (pediatric): Secondary | ICD-10-CM | POA: Diagnosis not present

## 2021-09-23 DIAGNOSIS — Z79891 Long term (current) use of opiate analgesic: Secondary | ICD-10-CM | POA: Diagnosis not present

## 2021-09-23 DIAGNOSIS — I5030 Unspecified diastolic (congestive) heart failure: Secondary | ICD-10-CM | POA: Diagnosis not present

## 2021-09-23 DIAGNOSIS — G4700 Insomnia, unspecified: Secondary | ICD-10-CM | POA: Diagnosis not present

## 2021-09-23 DIAGNOSIS — K219 Gastro-esophageal reflux disease without esophagitis: Secondary | ICD-10-CM | POA: Diagnosis not present

## 2021-09-23 DIAGNOSIS — J449 Chronic obstructive pulmonary disease, unspecified: Secondary | ICD-10-CM | POA: Diagnosis not present

## 2021-09-23 DIAGNOSIS — Z7901 Long term (current) use of anticoagulants: Secondary | ICD-10-CM | POA: Diagnosis not present

## 2021-09-23 DIAGNOSIS — M1990 Unspecified osteoarthritis, unspecified site: Secondary | ICD-10-CM | POA: Diagnosis not present

## 2021-09-23 DIAGNOSIS — F17210 Nicotine dependence, cigarettes, uncomplicated: Secondary | ICD-10-CM | POA: Diagnosis not present

## 2021-09-23 NOTE — Patient Instructions (Addendum)
Medication Instructions:  Continue your current medications.   *If you need a refill on your cardiac medications before your next appointment, please call your pharmacy*   Lab Work: None ordered today.   If you have labs (blood work) drawn today and your tests are completely normal, you will receive your results only by: [place] (if you have MyChart) OR A paper copy in the mail If you have any lab test that is abnormal or we need to change your treatment, we will call you to review the results.   Testing/Procedures: Your EKG today shows rate controlled atrial fibrillation   Follow-Up: At [facility], you and your health needs are our priority.  As part of our continuing mission to provide you with exceptional heart care, we have created designated Provider Care Teams.  These Care Teams include your primary Cardiologist (physician) and Advanced Practice Providers (APPs -  Physician Assistants and Nurse Practitioners) who all work together to provide you with the care you need, when you need it.  We recommend signing up for the patient portal called "MyChart".  Sign up information is provided on this After Visit Summary.  MyChart is used to connect with patients for Virtual Visits (Telemedicine).  Patients are able to view lab/test results, encounter notes, upcoming appointments, etc.  Non-urgent messages can be sent to your provider as well.   To learn more about what you can do with MyChart, go to [email or web address].    Your next appointment:   02/25/22 at 8:20AM with Dr. Stanford Breed   Other Instructions  Heart Healthy Diet Recommendations: A low-salt diet is recommended. Meats should be grilled, baked, or boiled. Avoid fried foods. Focus on lean protein sources like fish or chicken with vegetables and fruits. The American Heart Association is a Microbiologist!  American Heart Association Diet and Lifeystyle Recommendations    Exercise recommendations: The American  Heart Association recommends 150 minutes of moderate intensity exercise weekly. Try 30 minutes of moderate intensity exercise 4-5 times per week. This could include walking, jogging, or swimming. Try a membership to Immunologist through [employer]. The [place] also has lots of exercises classes available even via Zoom!

## 2021-09-23 NOTE — Progress Notes (Signed)
Office Visit    Patient Name: Shaun [name] Date of Encounter: 09/23/2021  PCP:  Maximiano Coss, NP   [facility]  Cardiologist:  Kirk Ruths, MD  Advanced Practice Provider:  No care team member to display Electrophysiologist:  None   Chief Complaint    Shaun [name] is a 66 y.o. male with a hx of permanent atrial fibrillation maintained on metoprolol and apixaban, CAD, HTN, HLD, chronic diastolic heart failure, OSA  presents today for hospital follow-up  Past Medical History    Past Medical History:  Diagnosis Date   Anemia    Anxiety    Asthma    CAD (coronary artery disease)    DES to distal circumflex 2016   Cataract    Colon polyps    30 colon polyps found on first colonoscopy   Diastolic heart failure ([place])    Diverticulitis    DJD (degenerative joint disease)    GERD (gastroesophageal reflux disease)    History of kidney stones    Hyperlipidemia    Hypertension    Insomnia    Obstructive sleep apnea 12/2009   01/26/2010 AHI 83/hr   Permanent atrial fibrillation ([place])    Onset 2006 paroxysmal then progressive to persistent   PUD (peptic ulcer disease)    1980s   RLS (restless legs syndrome)    Sinusitis    Skin cancer    Type 2 diabetes mellitus ([place])    Past Surgical History:  Procedure Laterality Date   BIOPSY  07/17/2018   Procedure: BIOPSY;  Surgeon: Danie Binder, MD;  [facility];  Service: Endoscopy;;  colon   BOWEL RESECTION  09/17/2018   SMALL BOWEL RESECTION: 71 CM    CARDIAC CATHETERIZATION N/A 07/21/2015   Procedure: Left Heart Cath and Coronary Angiography;  Surgeon: Peter M Martinique, MD;  Location: [place] [facility];  Service: Cardiovascular;  Laterality: N/A;   CARDIAC CATHETERIZATION N/A 07/21/2015   Procedure: Coronary Stent Intervention;  Surgeon: Peter M Martinique, MD;  Location: [place] [facility];  Service: Cardiovascular;  Laterality: N/A;   CIRCUMCISION N/A 04/05/2019   Procedure:  CIRCUMCISION ADULT;  Surgeon: Irine Seal, MD;  Location: [facility];  Service: Urology;  Laterality: N/A;   COLONOSCOPY N/A 05/19/2014   Dr. Barnie Alderman diverticulosis/moderate external hemorrhoids, >20 simple adenomas. Genetic screening negative.    COLONOSCOPY WITH PROPOFOL N/A 07/17/2018   Dr. Oneida Alar: Diverticulosis, external/internal hemorrhoids, 32 colon polyps removed.  ten tubular adenomas removed with no high-grade dysplasia.  Advised to have surveillance colonoscopy in 3 years.   COLONOSCOPY WITH PROPOFOL N/A 06/07/2021   Procedure: COLONOSCOPY WITH PROPOFOL;  Surgeon: Eloise Harman, DO;  [facility];  Service: Endoscopy;  Laterality: N/A;  9:30 / ASA 3  (Pt was told that his time will be given at Pre-op)   ESOPHAGOGASTRODUODENOSCOPY (EGD) WITH PROPOFOL N/A 07/17/2018   Dr. Oneida Alar: Low-grade narrowing Schatzki ring at the GE junction status post dilation.  Gastritis.  Biopsy with mild nonspecific reactive gastropathy.  No H. pylori.   GIVENS CAPSULE STUDY N/A 06/24/2019   normal   HERNIA REPAIR  1986   Left inguinal   INTRAVASCULAR PRESSURE WIRE/FFR STUDY Left 06/08/2017   Procedure: INTRAVASCULAR PRESSURE WIRE/FFR STUDY;  Surgeon: Nelva Bush, MD;  Location: [place] [facility];  Service: Cardiovascular;  Laterality: Left;  LAD and CFX   LAPAROTOMY N/A 09/17/2018   Procedure: EXPLORATORY LAPAROTOMY;  Surgeon: Virl Cagey, MD;  Location:  [facility];  Service: General;  Laterality: N/A;   LEFT HEART CATH AND CORONARY ANGIOGRAPHY N/A 06/08/2017   Procedure: LEFT HEART CATH AND CORONARY ANGIOGRAPHY;  Surgeon: Nelva Bush, MD;  Location: [place] [facility];  Service: Cardiovascular;  Laterality: N/A;   POLYPECTOMY  07/17/2018   Procedure: POLYPECTOMY;  Surgeon: Danie Binder, MD;  [facility];  Service: Endoscopy;;  colon   POLYPECTOMY  06/07/2021   Procedure: POLYPECTOMY INTESTINAL;  Surgeon: Eloise Harman, DO;  [facility];  Service:  Endoscopy;;   ROTATOR CUFF REPAIR     Right   SAVORY DILATION N/A 07/17/2018   Procedure: SAVORY DILATION;  Surgeon: Danie Binder, MD;  [facility];  Service: Endoscopy;  Laterality: N/A;    Allergies  No Known Allergies  History of Present Illness    Shaun STGERMAINE is a 66 y.o. male with a hx of permanent atrial fibrillation maintained on metoprolol and apixaban, CAD, HTN, HLD, chronic diastolic heart failure, OSA  last seen while hospitalized   LHC 05/2017 with mild to moderate nonobstructive disease recommended for medical management. 03/2019 LVEF 60-65%, severe basal septal hypertrophy, RV normal size and function, LA mild-mod dilated, normal aorta.  He was seen October 2021 and Zetia added for cholesterol control. Given abdominal bruit, abdominal ultrasound was ordered. He had CT abd ordered by surgical team 03/23/21 with largest measurement or aorta 3.1cm for thoracic aorta and 2.3 cm for abdominal aorta. No aneurysmal dilation seen.   He was seen 04/2021 for clearance for colonoscopy and ventral hernia repair and was doing well from cardiac perspective.  He was admitted 08/06/21-08/25/21 with flu and worsening shortness of breath which progressed to septic shock. He had atrial fib with RVR during admission for which cardiology was consulted. He did require NG tube placement. He was discharged to inpatient rehab and was there 08/25/21-08/30/21.   He presents today for follow-up with his wife.  He has been participating in home physical therapy.  Anticipates he will graduate soon.  Encouraged to continue with exercise afterwards.  He plans to walk his [place].  We also discussed [employer].  His blood pressure at home is routinely in the 110s/60s.  He did have one BP 90/50 associated with some lightheadedness.  He continues to follow with neurology regarding a tremor and had an MRI last Friday.  He has been told that he does not have  Parkinson's and is pleased with the result.  EKGs/Labs/Other Studies Reviewed:   The following studies were reviewed today:  Echo 08/10/21  1. Left ventricular ejection fraction, by estimation, is 60 to 65%. The  left ventricle has normal function. The left ventricle has no regional  wall motion abnormalities. There is moderate concentric left ventricular  hypertrophy. Left ventricular  diastolic parameters are indeterminate.   2. Right ventricular systolic function is normal. The right ventricular  size is normal. Tricuspid regurgitation signal is inadequate for assessing  PA pressure.   3. Left atrial size was mildly dilated.   4. The mitral valve is grossly normal. Trivial mitral valve  regurgitation.   5. The aortic valve is tricuspid. Aortic valve regurgitation is not  visualized.   6. Unable to estimate CVP.   CHA2DS2-VASc Score = 4  This indicates a 4.8% annual risk of stroke. The patient's score is based upon: CHF History: 1 HTN History: 1 Diabetes History: 1 Stroke History: 0 Vascular Disease History: 0 Age  Score: 1 Gender Score: 0   EKG:  EKG is ordered today.  The ekg ordered today demonstrates rate controlled atrial fibrillation 93 bpm with no acute St/T wave changes.  Recent Labs: 02/10/2021: TSH 0.40 08/23/2021: B Natriuretic Peptide 82.2; Magnesium 1.8 08/26/2021: Hemoglobin 11.9; Platelets 150 09/06/2021: ALT 41; BUN 14; Creatinine, Ser 0.78; Potassium 4.5; Sodium 139  Recent Lipid Panel    Component Value Date/Time   CHOL 156 02/10/2021 0853   CHOL 115 10/29/2020 0800   TRIG 153 (H) 08/18/2021 0336   HDL 28.90 (L) 02/10/2021 0853   HDL 29 (L) 10/29/2020 0800   CHOLHDL 5 02/10/2021 0853   VLDL 34.6 02/10/2021 0853   LDLCALC 92 02/10/2021 0853   LDLCALC 58 10/29/2020 0800   LDLCALC 75 12/15/2017 0744    Home Medications   Current Meds  Medication Sig   acetaminophen (TYLENOL) 325 MG tablet Take 2 tablets (650 mg total) by mouth every 6 (six)  hours as needed for mild pain.   albuterol (VENTOLIN HFA) 108 (90 Base) MCG/ACT inhaler INHALE 2 PUFFS INTO THE LUNGS EVERY 6 HOURS AS NEEDED FOR WHEEZING OR SHORTNESS OF BREATH   apixaban (ELIQUIS) 5 MG TABS tablet TAKE 1 TABLET (5 MG) BY MOUTH TWICE DAILY   ARIPiprazole (ABILIFY) 2 MG tablet Take 1 tablet (2 mg total) by mouth daily.   atorvastatin (LIPITOR) 80 MG tablet Take 1 tablet (80 mg total) by mouth daily. TAKE 1 TABLET(80 MG) BY MOUTH EVERY EVENING   docusate sodium (COLACE) 100 MG capsule Take 100 mg by mouth daily as needed for mild constipation.   escitalopram (LEXAPRO) 20 MG tablet TAKE 1 TABLET(20 MG) BY MOUTH DAILY   ezetimibe (ZETIA) 10 MG tablet TAKE 1 TABLET BY MOUTH EVERY DAY   ferrous sulfate 325 (65 FE) MG tablet Take 325 mg by mouth daily with breakfast.   furosemide (LASIX) 40 MG tablet Take 1 tablet (40 mg total) by mouth daily.   magnesium gluconate (MAGONATE) 500 MG tablet Take 0.5 tablets (250 mg total) by mouth at bedtime.   metFORMIN (GLUCOPHAGE) 500 MG tablet TAKE 1 TABLET (500 MG) BY MOUTH TWICE DAILY WITH A MEAL.   Metoprolol Tartrate 75 MG TABS Take 75 mg by mouth 2 (two) times daily.   nitroGLYCERIN (NITROSTAT) 0.4 MG SL tablet Place 1 tablet (0.4 mg total) under the tongue every 5 (five) minutes as needed.   omeprazole (PRILOSEC) 40 MG capsule Take 1 capsule by mouth once daily 30 minutes before breakfast   potassium chloride SA (KLOR-CON) 20 MEQ tablet Take 1 tablet (20 mEq total) by mouth daily.   rOPINIRole (REQUIP) 3 MG tablet TAKE 1 TABLET BY MOUTH EVERY NIGHT AT BEDTIME   sucralfate (CARAFATE) 1 g tablet Take one tablet po BID PRN (Patient taking differently: Take 1 g by mouth 2 (two) times daily as needed (acid reflux).)   traMADol (ULTRAM) 50 MG tablet Take 1 tablet (50 mg total) by mouth every 6 (six) hours as needed for moderate pain or severe pain. You should have 20 day supply of medication at home.   traZODone (DESYREL) 50 MG tablet TAKE 2 TABLETS  (100 MG) BY MOUTH AT BEDTIME     Review of Systems      All other systems reviewed and are otherwise negative except as noted above.  Physical Exam    VS:  BP 110/80 (BP Location: Right Arm, Patient Position: Sitting, Cuff Size: Normal)    Pulse 93    Ht 5'  7" (1.702 m)    Wt 202 lb 9.6 oz (91.9 kg)    BMI 31.73 kg/m  , BMI Body mass index is 31.73 kg/m.  Wt Readings from Last 3 Encounters:  09/23/21 202 lb 9.6 oz (91.9 kg)  09/06/21 200 lb (90.7 kg)  08/30/21 198 lb 3.1 oz (89.9 kg)    GEN: Well nourished, well developed, in no acute distress. HEENT: normal. Neck: Supple, no JVD, carotid bruits, or masses. Cardiac: Irregularly irregular, no murmurs, rubs, or gallops. No clubbing, cyanosis, edema.  Radials/PT 2+ and equal bilaterally.  Respiratory:  Respirations regular and unlabored, clear to auscultation bilaterally. GI: Soft, nontender, nondistended. MS: No deformity or atrophy. Skin: Warm and dry, no rash. Neuro:  Strength and sensation are intact. Psych: Normal affect.  Assessment & Plan    Permanent atrial fibrillation / Chronic anticoagulation -rate controlled today by EKG.  Denies bleeding complications.  Continue Eliquis 5 mg twice daily.  Does not meet dose reduction criteria.  Continue metoprolol tartrate 75 mg twice daily.  Chronic diastolic heart failure -Echo 07/2021 normal LVEF and indeterminate diastolic dysfunction in the setting of atrial fibrillation. Euvolemic and well compensated on exam. Continue Lasix 40mg  QD.  Low-salt diet, fluid restriction less than 2 L encouraged.  HTN - BP well controlled. Continue current antihypertensive regimen.  He did have isolated hypotensive reading at home - if that recurs encouraged to eat/drink something to elevate BP and sit to rest. If recurrent hypotension, he will contact our office.   HLD - Lipid panel 02/10/21 total cholesterol 156, HDL 28.9, LDL 92, triglycerides 173. LDL elevated in setting of missed doses of  Atorvasatin at the time. Previously LDL in the 50s. He will continue Atorvastatin, Zetia at present doses. Consider repeat lipid panel with next fasting labs.  DM2 - Continue to follow with PCP. If additional agent needed consider SGLT2i for cardioprotective benefit.   CAD - Stable with no anginal symptoms. No indication for ischemic evaluation.  GDMT includes Metoprolol, Atorvastatin. No ASA due to chronic anticoagulation. Heart healthy diet and regular cardiovascular exercise encouraged.    Disposition: Follow up in 6 months with Dr. Stanford Breed or APP.  Signed, Loel Dubonnet, NP 09/23/2021, 11:04 AM [place]

## 2021-09-27 ENCOUNTER — Other Ambulatory Visit (HOSPITAL_BASED_OUTPATIENT_CLINIC_OR_DEPARTMENT_OTHER): Payer: Self-pay

## 2021-09-28 DIAGNOSIS — E1165 Type 2 diabetes mellitus with hyperglycemia: Secondary | ICD-10-CM

## 2021-09-28 DIAGNOSIS — E785 Hyperlipidemia, unspecified: Secondary | ICD-10-CM | POA: Diagnosis not present

## 2021-09-28 DIAGNOSIS — Z9861 Coronary angioplasty status: Secondary | ICD-10-CM | POA: Diagnosis not present

## 2021-09-28 DIAGNOSIS — I2510 Atherosclerotic heart disease of native coronary artery without angina pectoris: Secondary | ICD-10-CM

## 2021-09-28 DIAGNOSIS — J439 Emphysema, unspecified: Secondary | ICD-10-CM | POA: Diagnosis not present

## 2021-09-28 DIAGNOSIS — I5032 Chronic diastolic (congestive) heart failure: Secondary | ICD-10-CM

## 2021-09-28 DIAGNOSIS — I482 Chronic atrial fibrillation, unspecified: Secondary | ICD-10-CM | POA: Diagnosis not present

## 2021-09-29 ENCOUNTER — Other Ambulatory Visit: Payer: Self-pay | Admitting: Registered Nurse

## 2021-09-29 ENCOUNTER — Other Ambulatory Visit (HOSPITAL_BASED_OUTPATIENT_CLINIC_OR_DEPARTMENT_OTHER): Payer: Self-pay

## 2021-09-29 ENCOUNTER — Other Ambulatory Visit: Payer: Self-pay | Admitting: Physical Medicine and Rehabilitation

## 2021-09-29 ENCOUNTER — Other Ambulatory Visit: Payer: Self-pay | Admitting: Physical Medicine & Rehabilitation

## 2021-09-29 DIAGNOSIS — F339 Major depressive disorder, recurrent, unspecified: Secondary | ICD-10-CM

## 2021-09-29 MED ORDER — METOPROLOL TARTRATE 75 MG PO TABS
75.0000 mg | ORAL_TABLET | Freq: Two times a day (BID) | ORAL | 0 refills | Status: DC
  Filled 2021-09-29: qty 60, 30d supply, fill #0

## 2021-09-29 MED ORDER — BUPROPION HCL ER (XL) 150 MG PO TB24
ORAL_TABLET | ORAL | 3 refills | Status: DC
Start: 2021-09-29 — End: 2022-07-29
  Filled 2021-09-29: qty 270, 90d supply, fill #0
  Filled 2021-12-28: qty 270, 90d supply, fill #1
  Filled 2022-05-04: qty 270, 90d supply, fill #2
  Filled 2022-07-28: qty 270, 90d supply, fill #3

## 2021-09-30 ENCOUNTER — Other Ambulatory Visit (HOSPITAL_BASED_OUTPATIENT_CLINIC_OR_DEPARTMENT_OTHER): Payer: Self-pay

## 2021-09-30 MED ORDER — TRAMADOL HCL 50 MG PO TABS
ORAL_TABLET | ORAL | 4 refills | Status: DC
  Filled 2021-09-30: qty 60, 30d supply, fill #0
  Filled 2021-10-29: qty 60, 30d supply, fill #1
  Filled 2021-12-01: qty 60, 30d supply, fill #2
  Filled 2021-12-30: qty 60, 30d supply, fill #3
  Filled 2022-01-31: qty 60, 30d supply, fill #4

## 2021-10-06 ENCOUNTER — Other Ambulatory Visit (HOSPITAL_BASED_OUTPATIENT_CLINIC_OR_DEPARTMENT_OTHER): Payer: Self-pay

## 2021-10-12 ENCOUNTER — Other Ambulatory Visit: Payer: Self-pay

## 2021-10-12 ENCOUNTER — Telehealth (INDEPENDENT_AMBULATORY_CARE_PROVIDER_SITE_OTHER): Payer: Medicare Other | Admitting: Registered Nurse

## 2021-10-12 ENCOUNTER — Other Ambulatory Visit (HOSPITAL_BASED_OUTPATIENT_CLINIC_OR_DEPARTMENT_OTHER): Payer: Self-pay

## 2021-10-12 ENCOUNTER — Encounter: Payer: Self-pay | Admitting: Registered Nurse

## 2021-10-12 VITALS — BP 145/100 | Temp 100.2°F | Wt 206.0 lb

## 2021-10-12 DIAGNOSIS — J069 Acute upper respiratory infection, unspecified: Secondary | ICD-10-CM

## 2021-10-12 MED ORDER — AZELASTINE HCL 0.1 % NA SOLN
1.0000 | Freq: Two times a day (BID) | NASAL | 12 refills | Status: DC
  Filled 2021-10-12: qty 30, 50d supply, fill #0
  Filled 2022-05-04: qty 30, 100d supply, fill #0

## 2021-10-12 MED ORDER — AMOXICILLIN-POT CLAVULANATE 875-125 MG PO TABS
1.0000 | ORAL_TABLET | Freq: Two times a day (BID) | ORAL | 0 refills | Status: DC
  Filled 2021-10-12: qty 20, 10d supply, fill #0

## 2021-10-12 MED ORDER — PREDNISONE 20 MG PO TABS
20.0000 mg | ORAL_TABLET | Freq: Every day | ORAL | 0 refills | Status: DC
  Filled 2021-10-12: qty 5, 5d supply, fill #0

## 2021-10-12 NOTE — Patient Instructions (Signed)
° ° ° °  If you have lab work done today you will be contacted with your lab results within the next 2 weeks.  If you have not heard from us then please contact us. The fastest way to get your results is to register for My Chart. ° ° °IF you received an x-ray today, you will receive an invoice from [facility]. Please contact [facility] at [phone] with questions or concerns regarding your invoice.  ° °IF you received labwork today, you will receive an invoice from [facility]. Please contact [facility] at [phone] with questions or concerns regarding your invoice.  ° °Our billing staff will not be able to assist you with questions regarding bills from these companies. ° °You will be contacted with the lab results as soon as they are available. The fastest way to get your results is to activate your My Chart account. Instructions are located on the last page of this paperwork. If you have not heard from us regarding the results in 2 weeks, please contact this office. °  ° ° ° °

## 2021-10-12 NOTE — Progress Notes (Signed)
Telemedicine Encounter- SOAP NOTE Established Patient  This telephone encounter was conducted with the patient's (or proxy's) verbal consent via audio telecommunications: yes/no: Yes Patient was instructed to have this encounter in a suitably private space; and to only have persons present to whom they give permission to participate. In addition, patient identity was confirmed by use of name plus two identifiers (DOB and address).  I discussed the limitations, risks, security and privacy concerns of performing an evaluation and management service by telephone and the availability of in person appointments. I also discussed with the patient that there may be a patient responsible charge related to this service. The patient expressed understanding and agreed to proceed.  I spent a total of  14 minutes talking with the patient or their proxy.  Patient at home Provider in office  Participants: Kathrin Ruddy, NP and Fortunato Curling Dusing  Chief Complaint  Patient presents with   Fever    Patient states he has been having a headaches , congestion , body aches , fever of 100.2 and so fatigue since yesterday. He has took some tylenol for head    Subjective   Shaun [name] is a 66 y.o. established patient. Telephone visit today for congestion  HPI Onset yesterday Fever, chills, headaches, congestion, fever 100.55F No nvd, shob, doe, acute sensory changes.  His wife has had bronchitis x 1 week.  Taking tylenol for symptoms with some relief.   No other sick contacts.   Negative home covid test.  Patient Active Problem List   Diagnosis Date Noted   Acute respiratory failure ([place]) 08/25/2021   Sundowning 08/23/2021   Dysphagia 08/22/2021   Hypernatremia 08/22/2021   Obesity (BMI 30-39.9) 08/21/2021   Endotracheally intubated    Influenza    Acute respiratory failure with hypoxemia ([place]) 08/06/2021   Ventral hernia 12/31/2020   Nausea with vomiting 12/31/2019   Dyslipidemia, goal  LDL below 70 06/18/2019   Hx of adenomatous colonic polyps 10/17/2018   Anemia 10/17/2018   Phimosis of penis 09/27/2018   Osteoarthritis of right hip 06/20/2018   Esophageal dysphagia 05/21/2018   GERD (gastroesophageal reflux disease) 05/21/2018   RLS (restless legs syndrome)    PUD (peptic ulcer disease)    Insomnia    Chronic diastolic CHF (congestive heart failure) ([place])    Asthma    Arteriosclerotic cardiovascular disease (ASCVD)    Chronic anticoagulation 01/27/2017   Depression 09/24/2015   COPD (chronic obstructive pulmonary disease) ([place]) 07/20/2015   Accelerating angina ([place]) 07/19/2015   Genetic testing 09/09/2014   H/O adenomatous polyp of colon 08/11/2014   Non-insulin treated type 2 diabetes mellitus ([place]) 02/09/2011   CAD S/P percutaneous coronary angioplasty    Tobacco abuse    Essential hypertension    Chronic atrial fibrillation ([place]) 02/23/2010   Obstructive sleep apnea 12/27/2009    Past Medical History:  Diagnosis Date   Anemia    Anxiety    Asthma    CAD (coronary artery disease)    DES to distal circumflex 2016   Cataract    Colon polyps    30 colon polyps found on first colonoscopy   Diastolic heart failure ([facility])    Diverticulitis    DJD (degenerative joint disease)    GERD (gastroesophageal reflux disease)    History of kidney stones    Hyperlipidemia    Hypertension    Insomnia    Obstructive sleep apnea 12/2009   01/26/2010 AHI 83/hr   Permanent atrial fibrillation ([place])  Onset 2006 paroxysmal then progressive to persistent   PUD (peptic ulcer disease)    1980s   RLS (restless legs syndrome)    Sinusitis    Skin cancer    Type 2 diabetes mellitus ([facility])     Current Outpatient Medications  Medication Sig Dispense Refill   acetaminophen (TYLENOL) 325 MG tablet Take 2 tablets (650 mg total) by mouth every 6 (six) hours as needed for mild pain.     albuterol (VENTOLIN HFA) 108 (90 Base) MCG/ACT inhaler INHALE 2 PUFFS INTO THE LUNGS  EVERY 6 HOURS AS NEEDED FOR WHEEZING OR SHORTNESS OF BREATH 18 g 11   amoxicillin-clavulanate (AUGMENTIN) 875-125 MG tablet Take 1 tablet by mouth 2 (two) times daily. 20 tablet 0   apixaban (ELIQUIS) 5 MG TABS tablet TAKE 1 TABLET (5 MG) BY MOUTH TWICE DAILY 180 tablet 1   ARIPiprazole (ABILIFY) 2 MG tablet Take 1 tablet (2 mg total) by mouth daily. 90 tablet 0   atorvastatin (LIPITOR) 80 MG tablet Take 1 tablet (80 mg total) by mouth daily. TAKE 1 TABLET(80 MG) BY MOUTH EVERY EVENING 90 tablet 3   azelastine (ASTELIN) 0.1 % nasal spray Place 1 spray into both nostrils 2 (two) times daily. Use in each nostril as directed 30 mL 12   buPROPion (WELLBUTRIN XL) 150 MG 24 hr tablet Take 3 tablets once daily 270 tablet 3   docusate sodium (COLACE) 100 MG capsule Take 100 mg by mouth daily as needed for mild constipation.     escitalopram (LEXAPRO) 20 MG tablet TAKE 1 TABLET(20 MG) BY MOUTH DAILY 90 tablet 0   ezetimibe (ZETIA) 10 MG tablet TAKE 1 TABLET BY MOUTH EVERY DAY 90 tablet 3   ferrous sulfate 325 (65 FE) MG tablet Take 325 mg by mouth daily with breakfast.     furosemide (LASIX) 40 MG tablet Take 1 tablet (40 mg total) by mouth daily. 90 tablet 3   magnesium gluconate (MAGONATE) 500 MG tablet Take 0.5 tablets (250 mg total) by mouth at bedtime. 30 tablet 0   metFORMIN (GLUCOPHAGE) 500 MG tablet TAKE 1 TABLET (500 MG) BY MOUTH TWICE DAILY WITH A MEAL. 180 tablet 0   Metoprolol Tartrate 75 MG TABS Take 75 mg by mouth 2 (two) times daily. 60 tablet 0   nitroGLYCERIN (NITROSTAT) 0.4 MG SL tablet Place 1 tablet (0.4 mg total) under the tongue every 5 (five) minutes as needed. 25 tablet 3   omeprazole (PRILOSEC) 40 MG capsule Take 1 capsule by mouth once daily 30 minutes before breakfast 90 capsule 3   potassium chloride SA (KLOR-CON) 20 MEQ tablet Take 1 tablet (20 mEq total) by mouth daily. 90 tablet 3   predniSONE (DELTASONE) 20 MG tablet Take 1 tablet (20 mg total) by mouth daily with breakfast.  5 tablet 0   rOPINIRole (REQUIP) 3 MG tablet TAKE 1 TABLET BY MOUTH EVERY NIGHT AT BEDTIME 450 tablet 0   sucralfate (CARAFATE) 1 g tablet Take one tablet po BID PRN (Patient taking differently: Take 1 g by mouth 2 (two) times daily as needed (acid reflux).) 42 tablet 5   traMADol (ULTRAM) 50 MG tablet Take 1 tablet (50 mg total) by mouth every 6 (six) hours as needed for moderate pain or severe pain. You should have 20 day supply of medication at home. 30 tablet    traMADol (ULTRAM) 50 MG tablet TAKE 1 TABLET BY MOUTH TWICE DAILY AS NEEDED FOR MODERATE PAIN. 60 tablet 4   traZODone (  DESYREL) 50 MG tablet TAKE 2 TABLETS (100 MG) BY MOUTH AT BEDTIME 180 tablet 1   No current facility-administered medications for this visit.    No Known Allergies  Social History   Socioeconomic History   Marital status: Married    Spouse name: Not on file   Number of children: 5   Years of education: Not on file   Highest education level: Not on file  Occupational History   Occupation: employed    Employer: [employer]: full-time  Tobacco Use   Smoking status: Former    Packs/day: 0.50    Years: 39.00    Pack years: 19.50    Types: Cigarettes    Start date: 07/24/1970    Quit date: 08/06/2021    Years since quitting: 0.1   Smokeless tobacco: Never  Vaping Use   Vaping Use: Never used  Substance and Sexual Activity   Alcohol use: No    Alcohol/week: 0.0 standard drinks   Drug use: No   Sexual activity: Yes    Partners: Female  Other Topics Concern   Not on file  Social History Narrative   Not on file   Social Determinants of Health   Financial Resource Strain: Low Risk    Difficulty of Paying Living Expenses: Not hard at all  Food Insecurity: No Food Insecurity   Worried About Charity fundraiser in the Last Year: Never true   [place] in the Last Year: Never true  Transportation Needs: No Transportation Needs   Lack of Transportation (Medical): No   Lack of  Transportation (Non-Medical): No  Physical Activity: Not on file  Stress: No Stress Concern Present   Feeling of Stress : Only a little  Social Connections: Not on file  Intimate Partner Violence: Not on file    ROS Per hpi   Objective   Vitals as reported by the patient: Today's Vitals   10/12/21 1215  BP: (!) 145/100  Temp: 100.2 F (37.9 C)  TempSrc: Temporal  Weight: 206 lb (93.4 kg)    Eryx was seen today for fever.  Diagnoses and all orders for this visit:  Acute upper respiratory infection -     amoxicillin-clavulanate (AUGMENTIN) 875-125 MG tablet; Take 1 tablet by mouth 2 (two) times daily. -     predniSONE (DELTASONE) 20 MG tablet; Take 1 tablet (20 mg total) by mouth daily with breakfast. -     azelastine (ASTELIN) 0.1 % nasal spray; Place 1 spray into both nostrils 2 (two) times daily. Use in each nostril as directed    PLAN Extreme concern for him developing a lower respiratory infection given his recent history. Will treat early with abx and prednisone as above Continue supportive care at home. Return if worsening or failing to improve Low threshold for seeking ER care. Patient encouraged to call clinic with any questions, comments, or concerns.  I discussed the assessment and treatment plan with the patient. The patient was provided an opportunity to ask questions and all were answered. The patient agreed with the plan and demonstrated an understanding of the instructions.   The patient was advised to call back or seek an in-person evaluation if the symptoms worsen or if the condition fails to improve as anticipated.  I provided 14 minutes of non-face-to-face time during this encounter.  Maximiano Coss, NP

## 2021-10-15 ENCOUNTER — Other Ambulatory Visit (HOSPITAL_BASED_OUTPATIENT_CLINIC_OR_DEPARTMENT_OTHER): Payer: Self-pay

## 2021-10-18 ENCOUNTER — Other Ambulatory Visit (HOSPITAL_BASED_OUTPATIENT_CLINIC_OR_DEPARTMENT_OTHER): Payer: Self-pay

## 2021-10-20 ENCOUNTER — Other Ambulatory Visit (HOSPITAL_BASED_OUTPATIENT_CLINIC_OR_DEPARTMENT_OTHER): Payer: Self-pay

## 2021-10-20 ENCOUNTER — Encounter: Payer: Self-pay | Admitting: Registered Nurse

## 2021-10-20 ENCOUNTER — Ambulatory Visit (INDEPENDENT_AMBULATORY_CARE_PROVIDER_SITE_OTHER): Payer: Medicare Other | Admitting: Registered Nurse

## 2021-10-20 ENCOUNTER — Other Ambulatory Visit: Payer: Self-pay

## 2021-10-20 VITALS — BP 109/70 | HR 60 | Temp 97.6°F | Resp 18 | Ht 67.0 in | Wt 202.6 lb

## 2021-10-20 DIAGNOSIS — I5032 Chronic diastolic (congestive) heart failure: Secondary | ICD-10-CM | POA: Diagnosis not present

## 2021-10-20 DIAGNOSIS — R251 Tremor, unspecified: Secondary | ICD-10-CM

## 2021-10-20 DIAGNOSIS — F5101 Primary insomnia: Secondary | ICD-10-CM

## 2021-10-20 DIAGNOSIS — E1165 Type 2 diabetes mellitus with hyperglycemia: Secondary | ICD-10-CM

## 2021-10-20 DIAGNOSIS — E785 Hyperlipidemia, unspecified: Secondary | ICD-10-CM

## 2021-10-20 LAB — CBC WITH DIFFERENTIAL/PLATELET
Basophils Absolute: 0 10*3/uL (ref 0.0–0.1)
Basophils Relative: 0.4 % (ref 0.0–3.0)
Eosinophils Absolute: 0 10*3/uL (ref 0.0–0.7)
Eosinophils Relative: 0.5 % (ref 0.0–5.0)
HCT: 42.5 % (ref 39.0–52.0)
Hemoglobin: 14.1 g/dL (ref 13.0–17.0)
Lymphocytes Relative: 28.3 % (ref 12.0–46.0)
Lymphs Abs: 1.7 10*3/uL (ref 0.7–4.0)
MCHC: 33.3 g/dL (ref 30.0–36.0)
MCV: 84 fl (ref 78.0–100.0)
Monocytes Absolute: 0.5 10*3/uL (ref 0.1–1.0)
Monocytes Relative: 7.9 % (ref 3.0–12.0)
Neutro Abs: 3.8 10*3/uL (ref 1.4–7.7)
Neutrophils Relative %: 62.9 % (ref 43.0–77.0)
Platelets: 135 10*3/uL — ABNORMAL LOW (ref 150.0–400.0)
RBC: 5.06 Mil/uL (ref 4.22–5.81)
RDW: 15.6 % — ABNORMAL HIGH (ref 11.5–15.5)
WBC: 6.1 10*3/uL (ref 4.0–10.5)

## 2021-10-20 LAB — LIPID PANEL
Cholesterol: 124 mg/dL (ref 0–200)
HDL: 29.2 mg/dL — ABNORMAL LOW (ref >39.00)
NonHDL: 95.05
Total CHOL/HDL Ratio: 4
Triglycerides: 227 mg/dL — ABNORMAL HIGH (ref 0.0–149.0)
VLDL: 45.4 mg/dL — ABNORMAL HIGH (ref 0.0–40.0)

## 2021-10-20 LAB — COMPREHENSIVE METABOLIC PANEL
ALT: 38 U/L (ref 0–53)
AST: 24 U/L (ref 0–37)
Albumin: 4 g/dL (ref 3.5–5.2)
Alkaline Phosphatase: 75 U/L (ref 39–117)
BUN: 15 mg/dL (ref 6–23)
CO2: 28 mEq/L (ref 19–32)
Calcium: 8.7 mg/dL (ref 8.4–10.5)
Chloride: 104 mEq/L (ref 96–112)
Creatinine, Ser: 0.89 mg/dL (ref 0.40–1.50)
GFR: 90.1 mL/min (ref >60.00)
Glucose, Bld: 108 mg/dL — ABNORMAL HIGH (ref 70–99)
Potassium: 4.2 mEq/L (ref 3.5–5.1)
Sodium: 138 mEq/L (ref 135–145)
Total Bilirubin: 0.7 mg/dL (ref 0.2–1.2)
Total Protein: 6.2 g/dL (ref 6.0–8.3)

## 2021-10-20 LAB — LDL CHOLESTEROL, DIRECT: Direct LDL: 68 mg/dL

## 2021-10-20 LAB — HEMOGLOBIN A1C: Hgb A1c MFr Bld: 6.3 % (ref 4.6–6.5)

## 2021-10-20 MED ORDER — QUETIAPINE FUMARATE 50 MG PO TABS
50.0000 mg | ORAL_TABLET | Freq: Every day | ORAL | 0 refills | Status: DC
  Filled 2021-10-20: qty 90, 90d supply, fill #0

## 2021-10-20 NOTE — Progress Notes (Signed)
Established Patient Office Visit  Subjective:  Patient ID: Shaun [name], male    DOB: [birth year]  Age: 66 y.o. MRN: [id number]  CC:  Chief Complaint  Patient presents with   Follow-up    Patient states he is here for a 6 month follow up. Patient states he has been having some problems going to sleep at night.    HPI Shaun [name] presents for 6 mo follow up  Stable since last visit on 09/06/21 for hfu.  Hypertension: Patient Currently taking: furosemide 68m po qd,  Good effect. No AEs. Denies CV symptoms including: chest pain, shob, doe, headache, visual changes, fatigue, claudication, and dependent edema.   Previous readings and labs: BP Readings from Last 3 Encounters:  10/20/21 109/70  10/12/21 (!) 145/100  09/23/21 110/80   Lab Results  Component Value Date   CREATININE 0.78 09/06/2021    T2dm Last A1c:  Lab Results  Component Value Date   HGBA1C 6.9 (H) 08/07/2021    Currently taking: metformin 5047mpo bid ac,  No new complications Reports good compliance with medications Diet has been healthy Exercise habits have been steady  Mental health Takes abilify 60m68mo qd, wellbutrin 150m51m po qd, lexapro 20mg24mqd, trazodone 100mg 29mhs prn.  Notes poor sleep. Trouble falling asleep and staying asleep.  Trazodone - unsure if effective.  Tremor Ongoing. MRI of brain largely unremarkable for cause of tremor.  He is on lexapro and abilify - both can contribute to tremor, he is aware of this. Interested in continuing to follow with neuro Dr. Athar.Rexene Albertsst Medical History:  Diagnosis Date   Anemia    Anxiety    Asthma    CAD (coronary artery disease)    DES to distal circumflex 2016   Cataract    Colon polyps    30 colon polyps found on first colonoscopy   Diastolic heart failure (HCC)    Diverticulitis    DJD (degenerative joint disease)    GERD (gastroesophageal reflux disease)    History of kidney stones    Hyperlipidemia     Hypertension    Insomnia    Obstructive sleep apnea 12/2009   01/26/2010 AHI 83/hr   Permanent atrial fibrillation (HCC)  [place] paroxysmal then progressive to persistent   PUD (peptic ulcer disease)    1980s   RLS (restless legs syndrome)    Sinusitis    Skin cancer    Type 2 diabetes mellitus (HCC)  [place]:  Procedure Laterality Date   BIOPSY  07/17/2018   Procedure: BIOPSY;  Surgeon: FieldsDanie Binder [facility];  Service: Endoscopy;;  colon   BOWEL RESECTION  09/17/2018   SMALL BOWEL RESECTION: 71 CM    CARDIAC CATHETERIZATION N/A 07/21/2015   Procedure: Left Heart Cath and Coronary Angiography;  Surgeon: Peter M JordanMartinique [facility];  Service: Cardiovascular;  Laterality: N/A;   CARDIAC CATHETERIZATION N/A 07/21/2015   Procedure: Coronary Stent Intervention;  Surgeon: Peter M JordanMartinique [facility];  Service: Cardiovascular;  Laterality: N/A;   CIRCUMCISION N/A 04/05/2019   Procedure: CIRCUMCISION ADULT;  Surgeon: Wrenn,Irine Seal Location: [facility];  Service: Urology;  Laterality: N/A;   COLONOSCOPY N/A 05/19/2014   Dr. FieldsBarnie Aldermanticulosis/moderate external hemorrhoids, >20 simple adenomas. Genetic screening negative.    COLONOSCOPY WITH PROPOFOL N/A 07/17/2018   Dr. fieldsOneida Alar  Diverticulosis, external/internal hemorrhoids, 32 colon polyps removed.  ten tubular adenomas removed with no high-grade dysplasia.  Advised to have surveillance colonoscopy in 3 years.   COLONOSCOPY WITH PROPOFOL N/A 06/07/2021   Procedure: COLONOSCOPY WITH PROPOFOL;  Surgeon: Eloise Harman, DO;  [facility];  Service: Endoscopy;  Laterality: N/A;  9:30 / ASA 3  (Pt was told that his time will be given at Pre-op)   ESOPHAGOGASTRODUODENOSCOPY (EGD) WITH PROPOFOL N/A 07/17/2018   Dr. Oneida Alar: Low-grade narrowing Schatzki ring at the GE junction status post dilation.  Gastritis.  Biopsy with mild nonspecific  reactive gastropathy.  No H. pylori.   GIVENS CAPSULE STUDY N/A 06/24/2019   normal   HERNIA REPAIR  1986   Left inguinal   INTRAVASCULAR PRESSURE WIRE/FFR STUDY Left 06/08/2017   Procedure: INTRAVASCULAR PRESSURE WIRE/FFR STUDY;  Surgeon: Nelva Bush, MD;  Location: [place] [facility];  Service: Cardiovascular;  Laterality: Left;  LAD and CFX   LAPAROTOMY N/A 09/17/2018   Procedure: EXPLORATORY LAPAROTOMY;  Surgeon: Virl Cagey, MD;  Location: [facility];  Service: General;  Laterality: N/A;   LEFT HEART CATH AND CORONARY ANGIOGRAPHY N/A 06/08/2017   Procedure: LEFT HEART CATH AND CORONARY ANGIOGRAPHY;  Surgeon: Nelva Bush, MD;  Location: [place] [facility];  Service: Cardiovascular;  Laterality: N/A;   POLYPECTOMY  07/17/2018   Procedure: POLYPECTOMY;  Surgeon: Danie Binder, MD;  [facility];  Service: Endoscopy;;  colon   POLYPECTOMY  06/07/2021   Procedure: POLYPECTOMY INTESTINAL;  Surgeon: Eloise Harman, DO;  [facility];  Service: Endoscopy;;   ROTATOR CUFF REPAIR     Right   SAVORY DILATION N/A 07/17/2018   Procedure: SAVORY DILATION;  Surgeon: Danie Binder, MD;  [facility];  Service: Endoscopy;  Laterality: N/A;    Family History  Problem Relation Age of Onset   Hypertension Mother    Breast cancer Mother 5       brain/bone    Heart attack Father    Skin cancer Sister 51   Diabetes Brother    Parkinson's disease Brother    Brain cancer Maternal Uncle    Cancer Maternal Uncle        NOS   Breast cancer Cousin        maternal cousin dx <50   Cancer Cousin    Colon cancer Neg Hx     Social History   Socioeconomic History   Marital status: Married    Spouse name: Not on file   Number of children: 5   Years of education: Not on file   Highest education level: Not on file  Occupational History   Occupation: employed    Employer: [employer]: full-time  Tobacco Use   Smoking status: Former     Packs/day: 0.50    Years: 39.00    Pack years: 19.50    Types: Cigarettes    Start date: 07/24/1970    Quit date: 08/06/2021    Years since quitting: 0.2   Smokeless tobacco: Never  Vaping Use   Vaping Use: Never used  Substance and Sexual Activity   Alcohol use: No    Alcohol/week: 0.0 standard drinks   Drug use: No   Sexual activity: Yes    Partners: Female  Other Topics Concern   Not on file  Social History Narrative   Not on file   Social Determinants of Health   Financial Resource Strain:  Low Risk    Difficulty of Paying Living Expenses: Not hard at all  Food Insecurity: No Food Insecurity   Worried About Running Out of Food in the Last Year: Never true   Ran Out of Food in the Last Year: Never true  Transportation Needs: No Transportation Needs   Lack of Transportation (Medical): No   Lack of Transportation (Non-Medical): No  Physical Activity: Not on file  Stress: No Stress Concern Present   Feeling of Stress : Only a little  Social Connections: Not on file  Intimate Partner Violence: Not on file    Outpatient Medications Prior to Visit  Medication Sig Dispense Refill   acetaminophen (TYLENOL) 325 MG tablet Take 2 tablets (650 mg total) by mouth every 6 (six) hours as needed for mild pain.     albuterol (VENTOLIN HFA) 108 (90 Base) MCG/ACT inhaler INHALE 2 PUFFS INTO THE LUNGS EVERY 6 HOURS AS NEEDED FOR WHEEZING OR SHORTNESS OF BREATH 18 g 11   amoxicillin-clavulanate (AUGMENTIN) 875-125 MG tablet Take 1 tablet by mouth 2 (two) times daily. 20 tablet 0   apixaban (ELIQUIS) 5 MG TABS tablet TAKE 1 TABLET (5 MG) BY MOUTH TWICE DAILY 180 tablet 1   atorvastatin (LIPITOR) 80 MG tablet Take 1 tablet (80 mg total) by mouth daily. TAKE 1 TABLET(80 MG) BY MOUTH EVERY EVENING 90 tablet 3   azelastine (ASTELIN) 0.1 % nasal spray Place 1 spray into both nostrils 2 (two) times daily. Use in each nostril as directed 30 mL 12   buPROPion (WELLBUTRIN XL) 150 MG 24 hr tablet Take  3 tablets once daily 270 tablet 3   docusate sodium (COLACE) 100 MG capsule Take 100 mg by mouth daily as needed for mild constipation.     escitalopram (LEXAPRO) 20 MG tablet TAKE 1 TABLET(20 MG) BY MOUTH DAILY 90 tablet 0   ezetimibe (ZETIA) 10 MG tablet TAKE 1 TABLET BY MOUTH EVERY DAY 90 tablet 3   ferrous sulfate 325 (65 FE) MG tablet Take 325 mg by mouth daily with breakfast.     furosemide (LASIX) 40 MG tablet Take 1 tablet (40 mg total) by mouth daily. 90 tablet 3   magnesium gluconate (MAGONATE) 500 MG tablet Take 0.5 tablets (250 mg total) by mouth at bedtime. 30 tablet 0   metFORMIN (GLUCOPHAGE) 500 MG tablet TAKE 1 TABLET (500 MG) BY MOUTH TWICE DAILY WITH A MEAL. 180 tablet 0   Metoprolol Tartrate 75 MG TABS Take 75 mg by mouth 2 (two) times daily. 60 tablet 0   nitroGLYCERIN (NITROSTAT) 0.4 MG SL tablet Place 1 tablet (0.4 mg total) under the tongue every 5 (five) minutes as needed. 25 tablet 3   omeprazole (PRILOSEC) 40 MG capsule Take 1 capsule by mouth once daily 30 minutes before breakfast 90 capsule 3   potassium chloride SA (KLOR-CON M) 20 MEQ tablet Take 1 tablet (20 mEq total) by mouth daily. 90 tablet 3   predniSONE (DELTASONE) 20 MG tablet Take 1 tablet (20 mg total) by mouth daily with breakfast. 5 tablet 0   rOPINIRole (REQUIP) 3 MG tablet TAKE 1 TABLET BY MOUTH EVERY NIGHT AT BEDTIME 450 tablet 0   sucralfate (CARAFATE) 1 g tablet Take one tablet po BID PRN (Patient taking differently: Take 1 g by mouth 2 (two) times daily as needed (acid reflux).) 42 tablet 5   traMADol (ULTRAM) 50 MG tablet Take 1 tablet (50 mg total) by mouth every 6 (six) hours as needed for moderate  pain or severe pain. You should have 20 day supply of medication at home. 30 tablet    traMADol (ULTRAM) 50 MG tablet TAKE 1 TABLET BY MOUTH TWICE DAILY AS NEEDED FOR MODERATE PAIN. 60 tablet 4   ARIPiprazole (ABILIFY) 2 MG tablet Take 1 tablet (2 mg total) by mouth daily. 90 tablet 0   traZODone  (DESYREL) 50 MG tablet TAKE 2 TABLETS (100 MG) BY MOUTH AT BEDTIME 180 tablet 1   No facility-administered medications prior to visit.    No Known Allergies  ROS Review of Systems  Constitutional: Negative.   HENT: Negative.    Eyes: Negative.   Respiratory: Negative.    Cardiovascular: Negative.   Gastrointestinal: Negative.   Endocrine: Negative.   Genitourinary: Negative.   Musculoskeletal: Negative.   Skin: Negative.   Allergic/Immunologic: Negative.   Neurological:  Positive for tremors.  Psychiatric/Behavioral:  Positive for sleep disturbance.   All other systems reviewed and are negative.    Objective:    Physical Exam Constitutional:      General: He is not in acute distress.    Appearance: Normal appearance. He is normal weight. He is not ill-appearing, toxic-appearing or diaphoretic.  Cardiovascular:     Rate and Rhythm: Normal rate and regular rhythm.     Heart sounds: Normal heart sounds. No murmur heard.   No friction rub. No gallop.  Pulmonary:     Effort: Pulmonary effort is normal. No respiratory distress.     Breath sounds: Normal breath sounds. No stridor. No wheezing, rhonchi or rales.  Chest:     Chest wall: No tenderness.  Neurological:     General: No focal deficit present.     Mental Status: He is alert and oriented to person, place, and time. Mental status is at baseline.  Psychiatric:        Mood and Affect: Mood normal.        Behavior: Behavior normal.        Thought Content: Thought content normal.        Judgment: Judgment normal.    BP 109/70    Pulse 60    Temp 97.6 F (36.4 C) (Temporal)    Resp 18    Ht 5' 7"  (1.702 m)    Wt 202 lb 9.6 oz (91.9 kg)    SpO2 100%    BMI 31.73 kg/m  Wt Readings from Last 3 Encounters:  10/20/21 202 lb 9.6 oz (91.9 kg)  10/12/21 206 lb (93.4 kg)  09/23/21 202 lb 9.6 oz (91.9 kg)     Health Maintenance Due  Topic Date Due   Zoster Vaccines- Shingrix (2 of 2) 05/25/2018   URINE MICROALBUMIN   12/31/2020    There are no preventive care reminders to display for this patient.  Lab Results  Component Value Date   TSH 0.40 02/10/2021   Lab Results  Component Value Date   WBC 5.2 08/26/2021   HGB 11.9 (L) 08/26/2021   HCT 36.4 (L) 08/26/2021   MCV 86.1 08/26/2021   PLT 150 08/26/2021   Lab Results  Component Value Date   NA 139 09/06/2021   K 4.5 09/06/2021   CO2 21 09/06/2021   GLUCOSE 92 09/06/2021   BUN 14 09/06/2021   CREATININE 0.78 09/06/2021   BILITOT 0.5 09/06/2021   ALKPHOS 114 09/06/2021   AST 27 09/06/2021   ALT 41 09/06/2021   PROT 6.1 09/06/2021   ALBUMIN 3.5 09/06/2021   CALCIUM 8.5 09/06/2021  ANIONGAP 11 08/30/2021   EGFR 96 06/28/2021   GFR 93.84 09/06/2021   Lab Results  Component Value Date   CHOL 156 02/10/2021   Lab Results  Component Value Date   HDL 28.90 (L) 02/10/2021   Lab Results  Component Value Date   LDLCALC 92 02/10/2021   Lab Results  Component Value Date   TRIG 153 (H) 08/18/2021   Lab Results  Component Value Date   CHOLHDL 5 02/10/2021   Lab Results  Component Value Date   HGBA1C 6.9 (H) 08/07/2021      Assessment & Plan:   Problem List Items Addressed This Visit       Cardiovascular and Mediastinum   Chronic diastolic CHF (congestive heart failure) (HCC)   Relevant Orders   Comprehensive metabolic panel   Hemoglobin A1c   CBC with Differential/Platelet   Lipid panel     Other   Insomnia   Relevant Medications   QUEtiapine (SEROQUEL) 50 MG tablet   Dyslipidemia, goal LDL below 70   Relevant Orders   Comprehensive metabolic panel   Hemoglobin A1c   CBC with Differential/Platelet   Lipid panel   Other Visit Diagnoses     Type 2 diabetes mellitus with hyperglycemia, without long-term current use of insulin (HCC)    -  Primary   Relevant Orders   Comprehensive metabolic panel   Hemoglobin A1c   CBC with Differential/Platelet   Lipid panel   Tremor       Relevant Orders   Comprehensive  metabolic panel   Hemoglobin A1c   CBC with Differential/Platelet   Lipid panel       Meds ordered this encounter  Medications   QUEtiapine (SEROQUEL) 50 MG tablet    Sig: Take 1 tablet (50 mg total) by mouth at bedtime.    Dispense:  90 tablet    Refill:  0    Order Specific Question:   Supervising Provider    Answer:   Carlota Raspberry, JEFFREY R [[id number]]    Follow-up: Return in about 6 months (around 04/19/2022) for htn, t2dm, insomnia.   PLAN Stop abilify. Stop trazodone. Start seroquel 73m po qhs. Will work on titration of dose to appropriate effect over next few weeks. Discussed that there is still a risk of Parkinsonism with seroquel. Pt voices understanding. Labs collected. Will follow up with the patient as warranted. Discussed sleep hygiene, healthy lifestyle Patient encouraged to call clinic with any questions, comments, or concerns.  RMaximiano Coss NP

## 2021-10-20 NOTE — Patient Instructions (Addendum)
Mr. Agostino -   Doristine Devoid to see you  Stop abilify Stop trazodone Start quetiapine 50mg  about an hour before bed. If ineffective after 1-2 weeks, can increase to 100mg  nightly. May still contribute to tremor   I will see if I can have Dr. Guadelupe Sabin office reach out to schedule.  See you in 6 mo unless labs are looking concerning  Thank you  Rich    If you have lab work done today you will be contacted with your lab results within the next 2 weeks.  If you have not heard from [place] then please contact us. The fastest way to get your results is to register for My Chart.   IF you received an x-ray today, you will receive an invoice from [facility]. Please contact [facility] at [phone] with questions or concerns regarding your invoice.   IF you received labwork today, you will receive an invoice from [facility]. Please contact [facility] at [phone] with questions or concerns regarding your invoice.   Our billing staff will not be able to assist you with questions regarding bills from these companies.  You will be contacted with the lab results as soon as they are available. The fastest way to get your results is to activate your My Chart account. Instructions are located on the last page of this paperwork. If you have not heard from [place] regarding the results in 2 weeks, please contact this office.

## 2021-10-21 ENCOUNTER — Encounter: Payer: Self-pay | Admitting: Physical Medicine & Rehabilitation

## 2021-10-21 ENCOUNTER — Encounter: Payer: Medicare Other | Attending: Physical Medicine & Rehabilitation | Admitting: Physical Medicine & Rehabilitation

## 2021-10-21 ENCOUNTER — Other Ambulatory Visit: Payer: Self-pay

## 2021-10-21 VITALS — BP 122/75 | HR 73 | Ht 67.0 in | Wt 204.0 lb

## 2021-10-21 DIAGNOSIS — R5381 Other malaise: Secondary | ICD-10-CM | POA: Insufficient documentation

## 2021-10-21 DIAGNOSIS — M1611 Unilateral primary osteoarthritis, right hip: Secondary | ICD-10-CM | POA: Diagnosis not present

## 2021-10-21 NOTE — Progress Notes (Signed)
Subjective:    Patient ID: Shaun [name], male    DOB: 09/09/1955, 66 y.o.   MRN: [id number]  HPI 66 year old male with history of right hip osteoarthritis he has been treated at this clinic mainly for right hip pain.  Unfortunately he has had a severe bout of influenza which caused pneumonia and respiratory failure requiring ICU admission, prolonged hospitalization and postacute rehabilitation at [facility] inpatient rehab unit.  His inpatient stay was only 5 days he did quite well.  He was discharged at a modified independent level.  Is still modified independent but he feels very weak compared to his usual.  He is back to driving.  He is completed home health therapy but not any outpatient therapy  RIght hip OA injection in 06/15/21  Pain Inventory Average Pain 5 Pain Right Now 6 My pain is intermittent and sharp  In the last 24 hours, has pain interfered with the following? General activity 5 Relation with others 0 Enjoyment of life 0 What TIME of day is your pain at its worst? varies Sleep (in general) Poor  Pain is worse with: walking, bending, standing, and some activites Pain improves with: rest, heat/ice, therapy/exercise, medication, TENS, and injections Relief from Meds: 3  Family History  Problem Relation Age of Onset   Hypertension Mother    Breast cancer Mother 10       brain/bone    Heart attack Father    Skin cancer Sister 42   Diabetes Brother    Parkinson's disease Brother    Brain cancer Maternal Uncle    Cancer Maternal Uncle        NOS   Breast cancer Cousin        maternal cousin dx <50   Cancer Cousin    Colon cancer Neg Hx    Social History   Socioeconomic History   Marital status: Married    Spouse name: Not on file   Number of children: 5   Years of education: Not on file   Highest education level: Not on file  Occupational History   Occupation: employed    Employer: [employer]    Comment: full-time  Tobacco Use   Smoking status:  Former    Packs/day: 0.50    Years: 39.00    Pack years: 19.50    Types: Cigarettes    Start date: 07/24/1970    Quit date: 08/06/2021    Years since quitting: 0.2   Smokeless tobacco: Never  Vaping Use   Vaping Use: Never used  Substance and Sexual Activity   Alcohol use: No    Alcohol/week: 0.0 standard drinks   Drug use: No   Sexual activity: Yes    Partners: Female  Other Topics Concern   Not on file  Social History Narrative   Not on file   Social Determinants of Health   Financial Resource Strain: Low Risk    Difficulty of Paying Living Expenses: Not hard at all  Food Insecurity: No Food Insecurity   Worried About Charity fundraiser in the Last Year: Never true   [place] in the Last Year: Never true  Transportation Needs: No Transportation Needs   Lack of Transportation (Medical): No   Lack of Transportation (Non-Medical): No  Physical Activity: Not on file  Stress: No Stress Concern Present   Feeling of Stress : Only a little  Social Connections: Not on file   Past Surgical History:  Procedure Laterality Date  BIOPSY  07/17/2018   Procedure: BIOPSY;  Surgeon: Danie Binder, MD;  [facility];  Service: Endoscopy;;  colon   BOWEL RESECTION  09/17/2018   SMALL BOWEL RESECTION: 71 CM    CARDIAC CATHETERIZATION N/A 07/21/2015   Procedure: Left Heart Cath and Coronary Angiography;  Surgeon: Peter M Martinique, MD;  Location: [place] [facility];  Service: Cardiovascular;  Laterality: N/A;   CARDIAC CATHETERIZATION N/A 07/21/2015   Procedure: Coronary Stent Intervention;  Surgeon: Peter M Martinique, MD;  Location: [place] [facility];  Service: Cardiovascular;  Laterality: N/A;   CIRCUMCISION N/A 04/05/2019   Procedure: CIRCUMCISION ADULT;  Surgeon: Irine Seal, MD;  Location: [facility];  Service: Urology;  Laterality: N/A;   COLONOSCOPY N/A 05/19/2014   Dr. Barnie Alderman diverticulosis/moderate external hemorrhoids, >20 simple adenomas. Genetic screening  negative.    COLONOSCOPY WITH PROPOFOL N/A 07/17/2018   Dr. Oneida Alar: Diverticulosis, external/internal hemorrhoids, 32 colon polyps removed.  ten tubular adenomas removed with no high-grade dysplasia.  Advised to have surveillance colonoscopy in 3 years.   COLONOSCOPY WITH PROPOFOL N/A 06/07/2021   Procedure: COLONOSCOPY WITH PROPOFOL;  Surgeon: Eloise Harman, DO;  [facility];  Service: Endoscopy;  Laterality: N/A;  9:30 / ASA 3  (Pt was told that his time will be given at Pre-op)   ESOPHAGOGASTRODUODENOSCOPY (EGD) WITH PROPOFOL N/A 07/17/2018   Dr. Oneida Alar: Low-grade narrowing Schatzki ring at the GE junction status post dilation.  Gastritis.  Biopsy with mild nonspecific reactive gastropathy.  No H. pylori.   GIVENS CAPSULE STUDY N/A 06/24/2019   normal   HERNIA REPAIR  1986   Left inguinal   INTRAVASCULAR PRESSURE WIRE/FFR STUDY Left 06/08/2017   Procedure: INTRAVASCULAR PRESSURE WIRE/FFR STUDY;  Surgeon: Nelva Bush, MD;  Location: [place] [facility];  Service: Cardiovascular;  Laterality: Left;  LAD and CFX   LAPAROTOMY N/A 09/17/2018   Procedure: EXPLORATORY LAPAROTOMY;  Surgeon: Virl Cagey, MD;  [facility];  Service: General;  Laterality: N/A;   LEFT HEART CATH AND CORONARY ANGIOGRAPHY N/A 06/08/2017   Procedure: LEFT HEART CATH AND CORONARY ANGIOGRAPHY;  Surgeon: Nelva Bush, MD;  Location: [name] [facility];  Service: Cardiovascular;  Laterality: N/A;   POLYPECTOMY  07/17/2018   Procedure: POLYPECTOMY;  Surgeon: Danie Binder, MD;  [facility];  Service: Endoscopy;;  colon   POLYPECTOMY  06/07/2021   Procedure: POLYPECTOMY INTESTINAL;  Surgeon: Eloise Harman, DO;  [facility];  Service: Endoscopy;;   ROTATOR CUFF REPAIR     Right   SAVORY DILATION N/A 07/17/2018   Procedure: SAVORY DILATION;  Surgeon: Danie Binder, MD;  [facility];  Service: Endoscopy;  Laterality: N/A;   Past Surgical History:   Procedure Laterality Date   BIOPSY  07/17/2018   Procedure: BIOPSY;  Surgeon: Danie Binder, MD;  [facility];  Service: Endoscopy;;  colon   BOWEL RESECTION  09/17/2018   SMALL BOWEL RESECTION: 71 CM    CARDIAC CATHETERIZATION N/A 07/21/2015   Procedure: Left Heart Cath and Coronary Angiography;  Surgeon: Peter M Martinique, MD;  Location: [place] [facility];  Service: Cardiovascular;  Laterality: N/A;   CARDIAC CATHETERIZATION N/A 07/21/2015   Procedure: Coronary Stent Intervention;  Surgeon: Peter M Martinique, MD;  Location: [place] [facility];  Service: Cardiovascular;  Laterality: N/A;   CIRCUMCISION N/A 04/05/2019   Procedure: CIRCUMCISION ADULT;  Surgeon: Irine Seal, MD;  [facility];  Service: Urology;  Laterality: N/A;   COLONOSCOPY N/A 05/19/2014   Dr. Barnie Alderman diverticulosis/moderate external hemorrhoids, >20 simple adenomas. Genetic screening negative.    COLONOSCOPY WITH PROPOFOL N/A 07/17/2018   Dr. Oneida Alar: Diverticulosis, external/internal hemorrhoids, 32 colon polyps removed.  ten tubular adenomas removed with no high-grade dysplasia.  Advised to have surveillance colonoscopy in 3 years.   COLONOSCOPY WITH PROPOFOL N/A 06/07/2021   Procedure: COLONOSCOPY WITH PROPOFOL;  Surgeon: Eloise Harman, DO;  [facility];  Service: Endoscopy;  Laterality: N/A;  9:30 / ASA 3  (Pt was told that his time will be given at Pre-op)   ESOPHAGOGASTRODUODENOSCOPY (EGD) WITH PROPOFOL N/A 07/17/2018   Dr. Oneida Alar: Low-grade narrowing Schatzki ring at the GE junction status post dilation.  Gastritis.  Biopsy with mild nonspecific reactive gastropathy.  No H. pylori.   GIVENS CAPSULE STUDY N/A 06/24/2019   normal   HERNIA REPAIR  1986   Left inguinal   INTRAVASCULAR PRESSURE WIRE/FFR STUDY Left 06/08/2017   Procedure: INTRAVASCULAR PRESSURE WIRE/FFR STUDY;  Surgeon: Nelva Bush, MD;  Location: [place] [facility];  Service: Cardiovascular;  Laterality: Left;  LAD and  CFX   LAPAROTOMY N/A 09/17/2018   Procedure: EXPLORATORY LAPAROTOMY;  Surgeon: Virl Cagey, MD;  [facility];  Service: General;  Laterality: N/A;   LEFT HEART CATH AND CORONARY ANGIOGRAPHY N/A 06/08/2017   Procedure: LEFT HEART CATH AND CORONARY ANGIOGRAPHY;  Surgeon: Nelva Bush, MD;  Location: [place] [facility];  Service: Cardiovascular;  Laterality: N/A;   POLYPECTOMY  07/17/2018   Procedure: POLYPECTOMY;  Surgeon: Danie Binder, MD;  [facility];  Service: Endoscopy;;  colon   POLYPECTOMY  06/07/2021   Procedure: POLYPECTOMY INTESTINAL;  Surgeon: Eloise Harman, DO;  [facility];  Service: Endoscopy;;   ROTATOR CUFF REPAIR     Right   SAVORY DILATION N/A 07/17/2018   Procedure: SAVORY DILATION;  Surgeon: Danie Binder, MD;  [facility];  Service: Endoscopy;  Laterality: N/A;   Past Medical History:  Diagnosis Date   Anemia    Anxiety    Asthma    CAD (coronary artery disease)    DES to distal circumflex 2016   Cataract    Colon polyps    30 colon polyps found on first colonoscopy   Diastolic heart failure (HCC)    Diverticulitis    DJD (degenerative joint disease)    GERD (gastroesophageal reflux disease)    History of kidney stones    Hyperlipidemia    Hypertension    Insomnia    Obstructive sleep apnea 12/2009   01/26/2010 AHI 83/hr   Permanent atrial fibrillation ([place])    Onset 2006 paroxysmal then progressive to persistent   PUD (peptic ulcer disease)    1980s   RLS (restless legs syndrome)    Sinusitis    Skin cancer    Type 2 diabetes mellitus (HCC)    BP 122/75    Pulse 73    Ht 5\' 7"  (1.702 m)    Wt 204 lb (92.5 kg)    SpO2 94%    BMI 31.95 kg/m   Opioid Risk Score:   Fall Risk Score:  `1  Depression screen PHQ 2/9  Depression screen [facility] 2/9 10/21/2021 10/20/2021 09/22/2021 09/06/2021 04/19/2021 04/13/2021 02/10/2021  Decreased Interest 2 2 1 2 2  0 0  Down, Depressed, Hopeless 0 2 1 1 1  0 0  PHQ - 2  Score 2 4  2 3 3  0 0  Altered sleeping - 3 3 3 1  - 0  Tired, decreased energy - 3 1 1 2  - 0  Change in appetite - 2 0 0 1 - 0  Feeling bad or failure about yourself  - 2 1 1 1  - 0  Trouble concentrating - 3 2 2 2  - 0  Moving slowly or fidgety/restless - 1 0 0 0 - 0  Suicidal thoughts - 0 0 0 0 - 0  PHQ-9 Score - 18 9 10 10  - 0  Difficult doing work/chores - Very difficult Not difficult at all Not difficult at all Somewhat difficult - Not difficult at all  Some recent data might be hidden     Review of Systems  Musculoskeletal:        Right hip pain going down front of leg to knee  Neurological:  Positive for tremors, weakness and numbness.  All other systems reviewed and are negative.     Objective:   Physical Exam Vitals and nursing note reviewed.  Constitutional:      Appearance: He is obese.  HENT:     Head: Normocephalic and atraumatic.  Eyes:     Extraocular Movements: Extraocular movements intact.     Conjunctiva/sclera: Conjunctivae normal.     Pupils: Pupils are equal, round, and reactive to light.  Musculoskeletal:     Right lower leg: No edema.     Left lower leg: No edema.     Comments: There is pain with right hip internal extra rotation as well as limited range of motion less than 25% of normal. Negative straight leg raising No joint swelling in the knee. Back is mild tenderness along the lumbar paraspinals but nothing severe.  His lumbar range of motion is about 50% flexion extension. Ambulates without assist device he does have a forward flexed posture no evidence of toe drag or knee instability.  Skin:    General: Skin is warm and dry.  Neurological:     Mental Status: He is alert and oriented to person, place, and time.  Psychiatric:        Mood and Affect: Mood normal.        Behavior: Behavior normal.          Assessment & Plan:   #1.  Right hip osteoarthritis has worsening pain both due to debility and overall weakness as well as he is greater  than 3 months post hip injection under fluoroscopic guidance 2.  Debility post respiratory failure associated with influenza pneumonia.  Will order outpatient PT twice a week.

## 2021-10-21 NOTE — Patient Instructions (Signed)
[employer] Whey protein powder , drink within 73min of PT exercise

## 2021-10-27 ENCOUNTER — Other Ambulatory Visit (HOSPITAL_BASED_OUTPATIENT_CLINIC_OR_DEPARTMENT_OTHER): Payer: Self-pay

## 2021-10-27 ENCOUNTER — Ambulatory Visit (INDEPENDENT_AMBULATORY_CARE_PROVIDER_SITE_OTHER): Payer: Medicare Other

## 2021-10-27 DIAGNOSIS — E785 Hyperlipidemia, unspecified: Secondary | ICD-10-CM

## 2021-10-27 DIAGNOSIS — F321 Major depressive disorder, single episode, moderate: Secondary | ICD-10-CM

## 2021-10-27 DIAGNOSIS — J439 Emphysema, unspecified: Secondary | ICD-10-CM

## 2021-10-27 DIAGNOSIS — I482 Chronic atrial fibrillation, unspecified: Secondary | ICD-10-CM

## 2021-10-27 DIAGNOSIS — I5032 Chronic diastolic (congestive) heart failure: Secondary | ICD-10-CM

## 2021-10-27 DIAGNOSIS — E1165 Type 2 diabetes mellitus with hyperglycemia: Secondary | ICD-10-CM

## 2021-10-27 DIAGNOSIS — Z9861 Coronary angioplasty status: Secondary | ICD-10-CM

## 2021-10-27 DIAGNOSIS — F5101 Primary insomnia: Secondary | ICD-10-CM

## 2021-10-27 DIAGNOSIS — I2510 Atherosclerotic heart disease of native coronary artery without angina pectoris: Secondary | ICD-10-CM

## 2021-10-27 NOTE — Patient Instructions (Signed)
Visit Information ? ?Thank you for taking time to visit with me today. Please don't hesitate to contact me if I can be of assistance to you before our next scheduled telephone appointment. ? ?Following are the goals we discussed today:  ?Take all medications as prescribed ?Attend all scheduled provider appointments ?Perform all self care activities independently  ?Call provider office for new concerns or questions  ?call office if I gain more than 2 pounds in one day or 5 pounds in one week ?keep legs up while sitting ?watch for swelling in feet, ankles and legs every day ?weigh myself daily ?follow rescue plan if symptoms flare-up ?keep appointment with eye doctor ?check blood sugar at prescribed times: once daily and when you have symptoms of low or high blood sugar ?check feet daily for cuts, sores or redness ?take the blood sugar log to all doctor visits ?drink 6 to 8 glasses of water each day ?fill half of plate with vegetables ?manage portion size ?switch to sugar-free drinks ?keep feet up while sitting ?wash and dry feet carefully every day ?eliminate smoking in my home ?identify and remove indoor air pollutants ?limit outdoor activity during cold weather ?eliminate symptom triggers at home ?check pulse (heart) rate once a day ?make a plan to exercise regularly ?make a plan to eat healthy ?check blood pressure daily ?choose a place to take my blood pressure (home, clinic or office, retail store) ?take blood pressure log to all doctor appointments ?take medications for blood pressure exactly as prescribed ?report new symptoms to your doctor ?eat more whole grains, fruits and vegetables, lean meats and healthy fats ?limit salt intake to 2300mg /day ?call for medicine refill 2 or 3 days before it runs out ?take all medications exactly as prescribed ?call doctor with any symptoms you believe are related to your medicine ? ?Our next appointment is by telephone on 12/15/21 at 2:15 PM ? ?Please call the care guide  team at [phone] if you need to cancel or reschedule your appointment.  ? ?If you are experiencing a Mental Health or [place] or need someone to talk to, please call the Suicide and Crisis Lifeline: 988 ?call the [place] National Suicide Prevention Lifeline: 762-872-8864 or TTY: 8581685919 TTY (403)722-2694) to talk to a trained counselor ?call 1-800-273-TALK (toll free, 24 hour hotline) ?go to [facility] [address] [place] [phone]) ?call 911  ? ?Patient verbalizes understanding of instructions and care plan provided today and agrees to view in [place]. Active MyChart status confirmed with patient.   ?Peter Garter RN, BSN,CCM, CDE ?Care Management Coordinator ? [facility] ?[phone]   ?

## 2021-10-27 NOTE — Chronic Care Management (AMB) (Signed)
Chronic Care Management   CCM RN Visit Note  10/27/2021 Name: Shaun [name] MRN: [id number] DOB: 1955-10-06  Subjective: Shaun [name] is a 66 y.o. year old male who is a primary care patient of Shaun Coss, NP. The care management team was consulted for assistance with disease management and care coordination needs.    Engaged with patient by telephone for follow up visit in response to provider referral for case management and/or care coordination services.   Consent to Services:  The patient was given information about Chronic Care Management services, agreed to services, and gave verbal consent prior to initiation of services.  Please see initial visit note for detailed documentation.   Patient agreed to services and verbal consent obtained.   Assessment: Review of patient past medical history, allergies, medications, health status, including review of consultants reports, laboratory and other test data, was performed as part of comprehensive evaluation and provision of chronic care management services.   SDOH (Social Determinants of Health) assessments and interventions performed:    CCM Care Plan  No Known Allergies  Outpatient Encounter Medications as of 10/27/2021  Medication Sig Note   acetaminophen (TYLENOL) 325 MG tablet Take 2 tablets (650 mg total) by mouth every 6 (six) hours as needed for mild pain.    albuterol (VENTOLIN HFA) 108 (90 Base) MCG/ACT inhaler INHALE 2 PUFFS INTO THE LUNGS EVERY 6 HOURS AS NEEDED FOR WHEEZING OR SHORTNESS OF BREATH    amoxicillin-clavulanate (AUGMENTIN) 875-125 MG tablet Take 1 tablet by mouth 2 (two) times daily.    apixaban (ELIQUIS) 5 MG TABS tablet TAKE 1 TABLET (5 MG) BY MOUTH TWICE DAILY    atorvastatin (LIPITOR) 80 MG tablet Take 1 tablet (80 mg total) by mouth daily. TAKE 1 TABLET(80 MG) BY MOUTH EVERY EVENING    azelastine (ASTELIN) 0.1 % nasal spray Place 1 spray into both nostrils 2 (two) times daily. Use in each  nostril as directed    buPROPion (WELLBUTRIN XL) 150 MG 24 hr tablet Take 3 tablets once daily    docusate sodium (COLACE) 100 MG capsule Take 100 mg by mouth daily as needed for mild constipation.    escitalopram (LEXAPRO) 20 MG tablet TAKE 1 TABLET(20 MG) BY MOUTH DAILY    ezetimibe (ZETIA) 10 MG tablet TAKE 1 TABLET BY MOUTH EVERY DAY    ferrous sulfate 325 (65 FE) MG tablet Take 325 mg by mouth daily with breakfast.    furosemide (LASIX) 40 MG tablet Take 1 tablet (40 mg total) by mouth daily.    magnesium gluconate (MAGONATE) 500 MG tablet Take 0.5 tablets (250 mg total) by mouth at bedtime.    metFORMIN (GLUCOPHAGE) 500 MG tablet TAKE 1 TABLET (500 MG) BY MOUTH TWICE DAILY WITH A MEAL.    Metoprolol Tartrate 75 MG TABS Take 75 mg by mouth 2 (two) times daily.    nitroGLYCERIN (NITROSTAT) 0.4 MG SL tablet Place 1 tablet (0.4 mg total) under the tongue every 5 (five) minutes as needed.    omeprazole (PRILOSEC) 40 MG capsule Take 1 capsule by mouth once daily 30 minutes before breakfast    potassium chloride SA (KLOR-CON M) 20 MEQ tablet Take 1 tablet (20 mEq total) by mouth daily.    predniSONE (DELTASONE) 20 MG tablet Take 1 tablet (20 mg total) by mouth daily with breakfast.    QUEtiapine (SEROQUEL) 50 MG tablet Take 1 tablet (50 mg total) by mouth at bedtime.    rOPINIRole (REQUIP) 3 MG tablet TAKE  1 TABLET BY MOUTH EVERY NIGHT AT BEDTIME    sucralfate (CARAFATE) 1 g tablet Take one tablet po BID PRN (Patient taking differently: Take 1 g by mouth 2 (two) times daily as needed (acid reflux).)    traMADol (ULTRAM) 50 MG tablet TAKE 1 TABLET BY MOUTH TWICE DAILY AS NEEDED FOR MODERATE PAIN. 10/21/2021: Didn't bring bottle, LD 10/20/2021   No facility-administered encounter medications on file as of 10/27/2021.    Patient Active Problem List   Diagnosis Date Noted   Debility 10/21/2021   Acute respiratory failure ([place]) 08/25/2021   Sundowning 08/23/2021   Dysphagia 08/22/2021    Hypernatremia 08/22/2021   Obesity (BMI 30-39.9) 08/21/2021   Endotracheally intubated    Influenza    Acute respiratory failure with hypoxemia ([place]) 08/06/2021   Ventral hernia 12/31/2020   Nausea with vomiting 12/31/2019   Dyslipidemia, goal LDL below 70 06/18/2019   Hx of adenomatous colonic polyps 10/17/2018   Anemia 10/17/2018   Phimosis of penis 09/27/2018   Primary osteoarthritis of right hip 06/20/2018   Esophageal dysphagia 05/21/2018   GERD (gastroesophageal reflux disease) 05/21/2018   RLS (restless legs syndrome)    PUD (peptic ulcer disease)    Insomnia    Chronic diastolic CHF (congestive heart failure) ([place])    Asthma    Arteriosclerotic cardiovascular disease (ASCVD)    Chronic anticoagulation 01/27/2017   Depression 09/24/2015   COPD (chronic obstructive pulmonary disease) ([place]) 07/20/2015   Accelerating angina ([place]) 07/19/2015   Genetic testing 09/09/2014   H/O adenomatous polyp of colon 08/11/2014   Non-insulin treated type 2 diabetes mellitus ([place]) 02/09/2011   CAD S/P percutaneous coronary angioplasty    Tobacco abuse    Essential hypertension    Chronic atrial fibrillation ([place]) 02/23/2010   Obstructive sleep apnea 12/27/2009    Conditions to be addressed/monitored:Atrial Fibrillation, CHF, CAD, HTN, HLD, COPD, DMII, and Depression  Care Plan : RN Care Manager Plan of Care  Updates made by Dimitri Ped, RN since 10/27/2021 12:00 AM     Problem: Chronic Disease Management and Care Coordination Needs (DM,CHF, CAD, HTN, Atrial Fib,COPD, depression, HLD)   Priority: High     Long-Range Goal: Establish Plan of Care for Chronic Disease Management Needs (DM,CHF, CAD, HTN, Atrial Fib,COPD, depression, HLD)   Start Date: 09/22/2021  Expected End Date: 09/19/2022  Priority: High  Note:   Current Barriers:  Knowledge Deficits related to plan of care for management of Atrial Fibrillation, CHF, CAD, HTN, HLD, COPD, DMII, and Depression  Chronic Disease  Management support and education needs related to Atrial Fibrillation, CHF, CAD, HTN, HLD, COPD, DMII, and Depression  No Advanced Directives in place States that he has been having sleeping at night.  States that the Seroquel that his provider ordered has not helped.  States he goes to bed at 10 PM and wakes up at 12:30 and can not go to sleep.  States he sometimes dozes in the recliner during the night.  States his insomnia has been worse since his hospitalization and he has thoughts about his hospitalization.  States he is trying to walk his dog and do the exercises they have taught him.  States his CBGs have ranged 110-115 in the morning and 150-165 later in the day.  Denies any low readings.  States he is weighting daily.  Denies any chest pains or swelling.  States he gets winded if he walks too fast and thinks it is from his A Fib not his lungs.  Denies any coughing or choking.  States he is trying to eat healthy and follows a low sodium diet.  States his B/P has ranging 105-120/70-75.  States he did feel  more tired when his B/P was lower.  States he is going to start going to PT in a few weeks to see if that will help with his hip pain  RNCM Clinical Goal(s):  Patient will verbalize understanding of plan for management of Atrial Fibrillation, CHF, CAD, HTN, HLD, COPD, DMII, and Depression as evidenced by voiced adherence to plan of care verbalize basic understanding of  Atrial Fibrillation, CHF, CAD, HTN, HLD, COPD, DMII, and Depression disease process and self health management plan as evidenced by voiced understanding and teach back take all medications exactly as prescribed and will call provider for medication related questions as evidenced by dispense report and pt verbalization attend all scheduled medical appointments: PT evaluation 11/11/21,Cardiology 02/25/22, Kathrin Ruddy NP 04/19/22, Dr. Letta Pate 12/07/21 as evidenced by medical records demonstrate Improved adherence to prescribed treatment  plan for Atrial Fibrillation, CHF, CAD, HTN, HLD, COPD, DMII, and Depression as evidenced by readings within limits, voiced adherence to plan of care continue to work with RN Care Manager to address care management and care coordination needs related to  Atrial Fibrillation, CHF, CAD, HTN, HLD, COPD, DMII, and Depression as evidenced by adherence to CM Team Scheduled appointments work with Education officer, museum to address  related to the management of Mental Health Concerns  related to the management of Atrial Fibrillation, CHF, CAD, HTN, HLD, COPD, DMII, and Depression as evidenced by review of EMR and patient or Education officer, museum report through collaboration with Consulting civil engineer, provider, and care team.   Interventions: 1:1 collaboration with primary care provider regarding development and update of comprehensive plan of care as evidenced by provider attestation and co-signature Inter-disciplinary care team collaboration (see longitudinal plan of care) Evaluation of current treatment plan related to  self management and patient's adherence to plan as established by provider   Insomnia/depression  (Status:  New goal.)  Long Term Goal Evaluation of current treatment plan related to Depression and insomnia , Mental Health Concerns  self-management and patient's adherence to plan as established by provider. Discussed plans with patient for ongoing care management follow up and provided patient with direct contact information for care management team Evaluation of current treatment plan related to Insomnia and patient's adherence to plan as established by provider Provided education to patient re: insomnia and sleep hygeine Social Work referral for depression and insomnia post hospitalization Discussed with pt his feelings since his hospitalization and how talking to LCSW could help. Agreeable to referral to LCSW    AFIB Interventions: (Status:  Goal on track:  Yes.) Long Term Goal   Counseled on increased risk  of stroke due to Afib and benefits of anticoagulation for stroke prevention Reviewed importance of adherence to anticoagulant exactly as prescribed Counseled on bleeding risk associated with Eliquis and importance of self-monitoring for signs/symptoms of bleeding Counseled on avoidance of NSAIDs due to increased bleeding risk with anticoagulants Afib action plan reviewed Reviewed to check pulse regularly and notify cardiology if pulse remains elevated    CAD Interventions: (Status:  Goal on track:  Yes.) Long Term Goal Assessed understanding of CAD diagnosis Medications reviewed including medications utilized in CAD treatment plan Provided education on importance of blood pressure control in management of CAD Provided education on Importance of limiting foods high in cholesterol Reviewed Importance of taking all medications as  prescribed Reinforced to continue to remain tobacco free   Heart Failure Interventions:  (Status:  Goal on track:  Yes.) Long Term Goal Basic overview and discussion of pathophysiology of Heart Failure reviewed Provided education on low sodium diet Reviewed Heart Failure Action Plan in depth and provided written copy Discussed importance of daily weight and advised patient to weigh and record daily Reviewed role of diuretics in prevention of fluid overload and management of heart failure; Discussed the importance of keeping all appointments with provider Reviewed s/sx of HF and when to call provider  COPD Interventions:  (Status:  Goal on track:  Yes.) Long Term Goal Advised patient to track and manage COPD triggers Provided instruction about proper use of medications used for management of COPD including inhalers Advised patient to engage in light exercise as tolerated 3-5 days a week to aid in the the management of COPD Reinforced to remain tobacco free   Diabetes Interventions:  (Status:  Goal on track:  Yes.) Long Term Goal Assessed patient's understanding  of A1c goal: <7% Provided education to patient about basic DM disease process Reviewed medications with patient and discussed importance of medication adherence Discussed plans with patient for ongoing care management follow up and provided patient with direct contact information for care management team Provided patient with written educational materials related to hypo and hyperglycemia and importance of correct treatment Advised patient, providing education and rationale, to check cbg daily and record, calling provider for findings outside established parameters Reviewed importance of regular exercise to help with blood sugar control Lab Results  Component Value Date   HGBA1C 6.3 10/20/2021   Hyperlipidemia Interventions:  (Status:  Goal on track:  Yes.) Long Term Goal Medication review performed; medication list updated in electronic medical record.  Provider established cholesterol goals reviewed Counseled on importance of regular laboratory monitoring as prescribed Reviewed role and benefits of statin for ASCVD risk reduction Reviewed importance of limiting foods high in cholesterol Reviewed exercise goals and target of 150 minutes per week  Hypertension Interventions:  (Status:  New goal.) Long Term Goal Last practice recorded BP readings:  BP Readings from Last 3 Encounters:  10/21/21 122/75  10/20/21 109/70  10/12/21 (!) 145/100  Most recent eGFR/CrCl:  Lab Results  Component Value Date   EGFR 96 06/28/2021    No components found for: CRCL  Evaluation of current treatment plan related to hypertension self management and patient's adherence to plan as established by provider Provided education to patient re: stroke prevention, s/s of heart attack and stroke Discussed plans with patient for ongoing care management follow up and provided patient with direct contact information for care management team Advised patient, providing education and rationale, to monitor blood  pressure daily and record, calling PCP for findings outside established parameters Provided education on prescribed diet low sodium low CHO heart healthy  Patient Goals/Self-Care Activities: Take all medications as prescribed Attend all scheduled provider appointments Perform all self care activities independently  Call provider office for new concerns or questions  call office if I gain more than 2 pounds in one day or 5 pounds in one week keep legs up while sitting watch for swelling in feet, ankles and legs every day weigh myself daily follow rescue plan if symptoms flare-up keep appointment with eye doctor check blood sugar at prescribed times: once daily and when you have symptoms of low or high blood sugar check feet daily for cuts, sores or redness take the blood sugar log to all  doctor visits drink 6 to 8 glasses of water each day fill half of plate with vegetables manage portion size switch to sugar-free drinks keep feet up while sitting wash and dry feet carefully every day eliminate smoking in my home identify and remove indoor air pollutants limit outdoor activity during cold weather eliminate symptom triggers at home check pulse (heart) rate once a day make a plan to exercise regularly make a plan to eat healthy check blood pressure daily choose a place to take my blood pressure (home, clinic or office, retail store) take blood pressure log to all doctor appointments take medications for blood pressure exactly as prescribed report new symptoms to your doctor eat more whole grains, fruits and vegetables, lean meats and healthy fats limit salt intake to 2314m/day call for medicine refill 2 or 3 days before it runs out take all medications exactly as prescribed call doctor with any symptoms you believe are related to your medicine  Follow Up Plan:  Telephone follow up appointment with care management team member scheduled for:  12/15/21 The patient has been  provided with contact information for the care management team and has been advised to call with any health related questions or concerns.       Plan:Telephone follow up appointment with care management team member scheduled for:  12/15/21 The patient has been provided with contact information for the care management team and has been advised to call with any health related questions or concerns.  MPeter GarterRN, BJackquline Denmark CDE Care Management Coordinator [facility] [phone]

## 2021-10-28 NOTE — Progress Notes (Signed)
Scheduled 3/9 ? ?Laverda Sorenson  ?Care Guide, Embedded Care Coordination ?[facility]  Care Management  ?Direct Dial: [phone] ? ?

## 2021-10-29 ENCOUNTER — Other Ambulatory Visit (HOSPITAL_BASED_OUTPATIENT_CLINIC_OR_DEPARTMENT_OTHER): Payer: Self-pay

## 2021-11-04 ENCOUNTER — Ambulatory Visit: Payer: Medicare Other | Admitting: *Deleted

## 2021-11-04 DIAGNOSIS — Z720 Tobacco use: Secondary | ICD-10-CM

## 2021-11-04 DIAGNOSIS — R251 Tremor, unspecified: Secondary | ICD-10-CM

## 2021-11-04 DIAGNOSIS — I482 Chronic atrial fibrillation, unspecified: Secondary | ICD-10-CM

## 2021-11-04 DIAGNOSIS — I10 Essential (primary) hypertension: Secondary | ICD-10-CM

## 2021-11-04 DIAGNOSIS — I5032 Chronic diastolic (congestive) heart failure: Secondary | ICD-10-CM

## 2021-11-04 DIAGNOSIS — I2510 Atherosclerotic heart disease of native coronary artery without angina pectoris: Secondary | ICD-10-CM

## 2021-11-04 DIAGNOSIS — F321 Major depressive disorder, single episode, moderate: Secondary | ICD-10-CM

## 2021-11-04 DIAGNOSIS — J439 Emphysema, unspecified: Secondary | ICD-10-CM

## 2021-11-04 DIAGNOSIS — J069 Acute upper respiratory infection, unspecified: Secondary | ICD-10-CM

## 2021-11-04 DIAGNOSIS — R270 Ataxia, unspecified: Secondary | ICD-10-CM

## 2021-11-04 DIAGNOSIS — E1165 Type 2 diabetes mellitus with hyperglycemia: Secondary | ICD-10-CM

## 2021-11-05 NOTE — Chronic Care Management (AMB) (Signed)
Chronic Care Management    Clinical Social Work Note  11/05/2021 Name: Shaun [name] MRN: [id number] DOB: July 17, 1956  Shaun [name] is a 66 y.o. year old male who is a primary care patient of Shaun Coss, NP. The CCM team was consulted to assist the patient with chronic disease management and/or care coordination needs related to: [employer], Mental Health Counseling and Resources, and Caregiver Stress.   Engaged with patient and wife by telephone for initial visit in response to provider referral for social work chronic care management and care coordination services.   Consent to Services:  The patient was given information about Chronic Care Management services, agreed to services, and gave verbal consent prior to initiation of services.  Please see initial visit note for detailed documentation.   Patient agreed to services and consent obtained.   Assessment: Review of patient past medical history, allergies, medications, and health status, including review of relevant consultants reports was performed today as part of a comprehensive evaluation and provision of chronic care management and care coordination services.     SDOH (Social Determinants of Health) assessments and interventions performed:  SDOH Interventions    Flowsheet Row Most Recent Value  SDOH Interventions   Food Insecurity Interventions Intervention Not Indicated  Financial Strain Interventions Intervention Not Indicated  Housing Interventions Intervention Not Indicated  Intimate Partner Violence Interventions Intervention Not Indicated  Physical Activity Interventions Intervention Not Indicated  Stress Interventions Intervention Not Indicated  Social Connections Interventions Intervention Not Indicated  Transportation Interventions Intervention Not Indicated  Depression Interventions/Treatment  Referral to Psychiatry, Medication, Counseling, Currently on Treatment        Advanced  Directives Status: See Care Plan for related entries.  CCM Care Plan  No Known Allergies  Outpatient Encounter Medications as of 11/04/2021  Medication Sig Note   acetaminophen (TYLENOL) 325 MG tablet Take 2 tablets (650 mg total) by mouth every 6 (six) hours as needed for mild pain.    albuterol (VENTOLIN HFA) 108 (90 Base) MCG/ACT inhaler INHALE 2 PUFFS INTO THE LUNGS EVERY 6 HOURS AS NEEDED FOR WHEEZING OR SHORTNESS OF BREATH    amoxicillin-clavulanate (AUGMENTIN) 875-125 MG tablet Take 1 tablet by mouth 2 (two) times daily.    apixaban (ELIQUIS) 5 MG TABS tablet TAKE 1 TABLET (5 MG) BY MOUTH TWICE DAILY    atorvastatin (LIPITOR) 80 MG tablet Take 1 tablet (80 mg total) by mouth daily. TAKE 1 TABLET(80 MG) BY MOUTH EVERY EVENING    azelastine (ASTELIN) 0.1 % nasal spray Place 1 spray into both nostrils 2 (two) times daily. Use in each nostril as directed    buPROPion (WELLBUTRIN XL) 150 MG 24 hr tablet Take 3 tablets once daily    docusate sodium (COLACE) 100 MG capsule Take 100 mg by mouth daily as needed for mild constipation.    escitalopram (LEXAPRO) 20 MG tablet TAKE 1 TABLET(20 MG) BY MOUTH DAILY    ezetimibe (ZETIA) 10 MG tablet TAKE 1 TABLET BY MOUTH EVERY DAY    ferrous sulfate 325 (65 FE) MG tablet Take 325 mg by mouth daily with breakfast.    furosemide (LASIX) 40 MG tablet Take 1 tablet (40 mg total) by mouth daily.    magnesium gluconate (MAGONATE) 500 MG tablet Take 0.5 tablets (250 mg total) by mouth at bedtime.    metFORMIN (GLUCOPHAGE) 500 MG tablet TAKE 1 TABLET (500 MG) BY MOUTH TWICE DAILY WITH A MEAL.    Metoprolol Tartrate 75 MG TABS Take 75 mg  by mouth 2 (two) times daily.    nitroGLYCERIN (NITROSTAT) 0.4 MG SL tablet Place 1 tablet (0.4 mg total) under the tongue every 5 (five) minutes as needed.    omeprazole (PRILOSEC) 40 MG capsule Take 1 capsule by mouth once daily 30 minutes before breakfast    potassium chloride SA (KLOR-CON M) 20 MEQ tablet Take 1 tablet (20  mEq total) by mouth daily.    predniSONE (DELTASONE) 20 MG tablet Take 1 tablet (20 mg total) by mouth daily with breakfast.    QUEtiapine (SEROQUEL) 50 MG tablet Take 1 tablet (50 mg total) by mouth at bedtime.    rOPINIRole (REQUIP) 3 MG tablet TAKE 1 TABLET BY MOUTH EVERY NIGHT AT BEDTIME    sucralfate (CARAFATE) 1 g tablet Take one tablet po BID PRN (Patient taking differently: Take 1 g by mouth 2 (two) times daily as needed (acid reflux).)    traMADol (ULTRAM) 50 MG tablet TAKE 1 TABLET BY MOUTH TWICE DAILY AS NEEDED FOR MODERATE PAIN. 10/21/2021: Didn't bring bottle, LD 10/20/2021   No facility-administered encounter medications on file as of 11/04/2021.    Patient Active Problem List   Diagnosis Date Noted   Debility 10/21/2021   Acute respiratory failure ([place]) 08/25/2021   Sundowning 08/23/2021   Dysphagia 08/22/2021   Hypernatremia 08/22/2021   Obesity (BMI 30-39.9) 08/21/2021   Endotracheally intubated    Influenza    Acute respiratory failure with hypoxemia ([place]) 08/06/2021   Ventral hernia 12/31/2020   Nausea with vomiting 12/31/2019   Dyslipidemia, goal LDL below 70 06/18/2019   Hx of adenomatous colonic polyps 10/17/2018   Anemia 10/17/2018   Phimosis of penis 09/27/2018   Primary osteoarthritis of right hip 06/20/2018   Esophageal dysphagia 05/21/2018   GERD (gastroesophageal reflux disease) 05/21/2018   RLS (restless legs syndrome)    PUD (peptic ulcer disease)    Insomnia    Chronic diastolic CHF (congestive heart failure) ([place])    Asthma    Arteriosclerotic cardiovascular disease (ASCVD)    Chronic anticoagulation 01/27/2017   Depression 09/24/2015   COPD (chronic obstructive pulmonary disease) ([place]) 07/20/2015   Accelerating angina ([place]) 07/19/2015   Genetic testing 09/09/2014   H/O adenomatous polyp of colon 08/11/2014   Non-insulin treated type 2 diabetes mellitus ([place]) 02/09/2011   CAD S/P percutaneous coronary angioplasty    Tobacco abuse    Essential  hypertension    Chronic atrial fibrillation ([place]) 02/23/2010   Obstructive sleep apnea 12/27/2009    Conditions to be addressed/monitored: CHF, CAD, HTN, HLD, DMII, and Depression.  Mental Health Concerns, Caregiver Stress, and Lacks Knowledge of [employer].  Care Plan : LCSW Plan of Care  Updates made by Francis Gaines, LCSW since 11/05/2021 12:00 AM     Problem: Reduce and Manage My Symptoms of Depression.   Priority: High     Goal: Reduce and Manage My Symptoms of Depression.   Start Date: 11/04/2021  Expected End Date: 02/04/2022  This Visit's Progress: On track  Priority: High  Note:   Current Barriers:   Acute Mental Health needs related to DM2, CHF, CAD, Atrial Fib, HTN, HLD, COPD, Depression, Obesity, Debility, Sundowning, Tobacco Abuse, and Chronic Anticoagulation, requires Support, Education, Resources, Referrals, Advocacy, and Care Coordination, in order to meet unmet Acute Mental Health needs. Clinical Goal(s):  Patient will work with LCSW, to reduce and manage symptoms of Depression, until well-controlled.     Patient will increase knowledge and/or ability of:  Coping Skills, Healthy Habits, Self-Management Skills, Stress Reduction, Home Safety and Utilizing [employer] and Resources.   Interventions: Collaboration with Primary Care Provider, Shaun [name] regarding development and update of comprehensive plan of care, as evidenced by provider attestation and co-signature. Inter-disciplinary care team collaboration (see longitudinal plan of care). Clinical Interventions:  Assessed patient's previous treatment, needs, coping skills, current treatment, support system, and barriers to care. PHQ-2 and PHQ-9 Depression Screening Tool performed, and results reviewed with patient and wife. Mindfulness Meditation Strategies, Relaxation Techniques and Deep Breathing Exercises taught, and encouraged daily. Solution-Focused Therapy performed. Verbalization  of Feelings encouraged. Emotional Support provided. Problem Solving Solutions developed. Brief Cognitive Behavioral Therapy initiated. Quality of Sleep assessed, and Sleep Hygiene Techniques promoted. Support Group Participation encouraged. Increase Level of Activity/Exercise emphasized.  Caregiver Stress acknowledged.   Discussed plans with patient for ongoing care management follow-up, and provided patient with direct contact information for care management team. Discussed several options for long-term counseling based on need and insurance through [employer], but patient denies the need for long-term counseling, nor is he interested in being referred to [facility], or any other community mental health provider, for counseling and supportive services.   Patient Goals/Self-Care Activities: Begin personal counseling with LCSW, on a bi-weekly basis, in an effort to reduce and manage symptoms of Depression, until well-controlled.   Incorporate into daily practice - relaxation techniques, deep breathing exercises and mindfulness meditation strategies. Consider self-enrollment in a support group of interest, from the list provided Contact LCSW directly (# [phone]), if you have questions, need assistance, or if additional social work needs are identified between now and our next scheduled telephone outreach call.  Follow-Up:  11/08/2021 at 3:30 pm  Nat Christen LCSW Licensed Clinical Social Worker [place] [phone]

## 2021-11-05 NOTE — Progress Notes (Signed)
Scheduled

## 2021-11-05 NOTE — Patient Instructions (Signed)
Visit Information  ? ?Thank you for taking time to visit with me today. Please don't hesitate to contact me if I can be of assistance to you before our next scheduled telephone appointment. ? ?Following are the goals we discussed today:  ?Patient Goals/Self-Care Activities: ?Begin personal counseling with LCSW, on a bi-weekly basis, in an effort to reduce and manage symptoms of Depression, until well-controlled.   ?Incorporate into daily practice - relaxation techniques, deep breathing exercises and mindfulness meditation strategies. ?Consider self-enrollment in a support group of interest, from the list provided ?Contact LCSW directly (# [phone]), if you have questions, need assistance, or if additional social work needs are identified between now and our next scheduled telephone outreach call.  ?Follow-Up:  11/08/2021 at 3:30 pm ? ?Please call the care guide team at [phone] if you need to cancel or reschedule your appointment.  ? ?If you are experiencing a Mental Health or [place] or need someone to talk to, please call the Suicide and Crisis Lifeline: 988 ?call the [place] National Suicide Prevention Lifeline: 402-455-9677 or TTY: 575-595-9739 TTY 573-866-4077) to talk to a trained counselor ?call 1-800-273-TALK (toll free, 24 hour hotline) ?go to [facility] [address] [place] [phone]) ?call the [facility]: [phone] ?call 911  ? ?Following is a copy of your full care plan:  ?Care Plan : [place]  ?Updates made by Francis Gaines, LCSW since 11/05/2021 12:00 AM  ?  ? ?Problem: Reduce and Manage My Symptoms of Depression.   ?Priority: High  ?  ? ?Goal: Reduce and Manage My Symptoms of Depression.   ?Start Date: 11/04/2021  ?Expected End Date: 02/04/2022  ?This Visit's Progress: On track  ?Priority: High  ?Note:   ?Current Barriers:   ?Acute Mental Health needs related to DM2, CHF, CAD, Atrial Fib, HTN, HLD,  COPD, Depression, Obesity, Debility, Sundowning, Tobacco Abuse, and Chronic Anticoagulation, requires Support, Education, Resources, Referrals, Advocacy, and Care Coordination, in order to meet unmet Acute Mental Health needs. ?Clinical Goal(s):  ?Patient will work with LCSW, to reduce and manage symptoms of Depression, until well-controlled.     ?Patient will increase knowledge and/or ability of:  ?      Coping Skills, Healthy Habits, Self-Management Skills, Stress Reduction, Home Safety and Utilizing [employer] and Resources.   ?Interventions: ?Collaboration with Primary Care Provider, Maximiano Coss regarding development and update of comprehensive plan of care, as evidenced by provider attestation and co-signature. ?Inter-disciplinary care team collaboration (see longitudinal plan of care). ?Clinical Interventions:  ?Assessed patient's previous treatment, needs, coping skills, current treatment, support system, and barriers to care. ?PHQ-2 and PHQ-9 Depression Screening Tool performed, and results reviewed with patient and wife. ?Mindfulness Meditation Strategies, Relaxation Techniques and Deep Breathing Exercises taught, and encouraged daily. ?Solution-Focused Therapy performed. ?Verbalization of Feelings encouraged. ?Emotional Support provided. ?Problem Solving Solutions developed. ?Brief Cognitive Behavioral Therapy initiated. ?Quality of Sleep assessed, and Sleep Hygiene Techniques promoted. ?Support Group Participation encouraged. ?Increase Level of Activity/Exercise emphasized.  ?Caregiver Stress acknowledged.   ?Discussed plans with patient for ongoing care management follow-up, and provided patient with direct contact information for care management team. ?Discussed several options for long-term counseling based on need and insurance through [employer], but patient denies the need for long-term counseling, nor is he interested in being referred to [employer], or any other  community mental health provider, for counseling and supportive services.   ?Patient Goals/Self-Care Activities: ?Begin personal counseling with  LCSW, on a bi-weekly basis, in an effort to reduce and manage symptoms of Depression, until well-controlled.   ?Incorporate into daily practice - relaxation techniques, deep breathing exercises and mindfulness meditation strategies. ?Consider self-enrollment in a support group of interest, from the list provided ?Contact LCSW directly (# [phone]), if you have questions, need assistance, or if additional social work needs are identified between now and our next scheduled telephone outreach call.  ?Follow-Up:  11/08/2021 at 3:30 pm ? ? ? ?  ? ? ?Consent to CCM Services: ?Mr. Nalepa was given information about Chronic Care Management services including:  ?CCM service includes personalized support from designated clinical staff supervised by his physician, including individualized plan of care and coordination with other care providers ?24/7 contact phone numbers for assistance for urgent and routine care needs. ?Service will only be billed when office clinical staff spend 20 minutes or more in a month to coordinate care. ?Only one practitioner may furnish and bill the service in a calendar month. ?The patient may stop CCM services at any time (effective at the end of the month) by phone call to the office staff. ?The patient will be responsible for cost sharing (co-pay) of up to 20% of the service fee (after annual deductible is met). ? ?Patient agreed to services and verbal consent obtained.  ? ?Patient verbalizes understanding of instructions and care plan provided today and agrees to view in [place]. Active MyChart status confirmed with patient.   ? ?Telephone follow up appointment with care management team member scheduled for:  11/08/2021 at 3:30 pm. ? ?Nat Christen LCSW ?Licensed Clinical Social Worker ?[facility] ?[phone]  ? ? ?  ?

## 2021-11-08 ENCOUNTER — Ambulatory Visit: Payer: Medicare Other | Admitting: *Deleted

## 2021-11-08 ENCOUNTER — Other Ambulatory Visit (HOSPITAL_BASED_OUTPATIENT_CLINIC_OR_DEPARTMENT_OTHER): Payer: Self-pay

## 2021-11-08 ENCOUNTER — Encounter: Payer: Self-pay | Admitting: *Deleted

## 2021-11-08 DIAGNOSIS — I5032 Chronic diastolic (congestive) heart failure: Secondary | ICD-10-CM

## 2021-11-08 DIAGNOSIS — R270 Ataxia, unspecified: Secondary | ICD-10-CM

## 2021-11-08 DIAGNOSIS — E1165 Type 2 diabetes mellitus with hyperglycemia: Secondary | ICD-10-CM

## 2021-11-08 DIAGNOSIS — Z720 Tobacco use: Secondary | ICD-10-CM

## 2021-11-08 DIAGNOSIS — I10 Essential (primary) hypertension: Secondary | ICD-10-CM

## 2021-11-08 DIAGNOSIS — I200 Unstable angina: Secondary | ICD-10-CM

## 2021-11-08 DIAGNOSIS — F321 Major depressive disorder, single episode, moderate: Secondary | ICD-10-CM

## 2021-11-08 DIAGNOSIS — J439 Emphysema, unspecified: Secondary | ICD-10-CM

## 2021-11-08 NOTE — Patient Instructions (Signed)
Visit Information ? ?Thank you for taking time to visit with me today. Please don't hesitate to contact me if I can be of assistance to you before our next scheduled telephone appointment. ? ?Following are the goals we discussed today:  ?Patient Goals/Self-Care Activities: ?Continue to receive personal counseling with LCSW, on a bi-weekly basis, in an effort to reduce and manage symptoms of Depression, until well-controlled.   ?Continue to incorporate into daily practice - relaxation techniques, deep breathing exercises, and mindfulness meditation strategies. ?Review [employer] Educational Material on "Depression in Adults" and "Tips for How to Help Your Mood", mailed to your home on 11/09/2021, and be prepared to discuss next week. ?Contact LCSW directly (# [phone]), if you have questions, need assistance, or if additional social work needs are identified between now and our next scheduled telephone outreach call.  ?Follow-Up:  11/18/2021 at 11:30 am ?Please call the care guide team at [phone] if you need to cancel or reschedule your appointment.  ? ?If you are experiencing a Mental Health or [place] or need someone to talk to, please call the Suicide and Crisis Lifeline: 988 ?call the [place] National Suicide Prevention Lifeline: 573 776 4762 or TTY: 475-533-5196 TTY (985)250-2966) to talk to a trained counselor ?call 1-800-273-TALK (toll free, 24 hour hotline) ?go to [facility] [address] [place] [phone]) ?call the [facility]: [phone] ?call 911  ? ?Patient verbalizes understanding of instructions and care plan provided today and agrees to view in [place]. Active MyChart status confirmed with patient.   ? ?Nat Christen LCSW ?Licensed Clinical Social Worker ?[facility] ?[phone]  ?

## 2021-11-08 NOTE — Chronic Care Management (AMB) (Cosign Needed)
Chronic Care Management    Clinical Social Work Note  11/08/2021 Name: ADRIN [name] MRN: [id number] DOB: 07-05-1956  Shaun [name] is a 66 y.o. year old male who is a primary care patient of Shaun Coss, NP. The CCM team was consulted to assist the patient with chronic disease management and/or care coordination needs related to: Shaun [name].   Engaged with patient by telephone for follow up visit in response to provider referral for social work chronic care management and care coordination services.   Consent to Services:  The patient was given information about Chronic Care Management services, agreed to services, and gave verbal consent prior to initiation of services.  Please see initial visit note for detailed documentation.   Patient agreed to services and consent obtained.   Assessment: Review of patient past medical history, allergies, medications, and health status, including review of relevant consultants reports was performed today as part of a comprehensive evaluation and provision of chronic care management and care coordination services.     SDOH (Social Determinants of Health) assessments and interventions performed:    Advanced Directives Status: Not addressed in this encounter.  CCM Care Plan  No Known Allergies  Outpatient Encounter Medications as of 11/08/2021  Medication Sig Note   acetaminophen (TYLENOL) 325 MG tablet Take 2 tablets (650 mg total) by mouth every 6 (six) hours as needed for mild pain.    albuterol (VENTOLIN HFA) 108 (90 Base) MCG/ACT inhaler INHALE 2 PUFFS INTO THE LUNGS EVERY 6 HOURS AS NEEDED FOR WHEEZING OR SHORTNESS OF BREATH    amoxicillin-clavulanate (AUGMENTIN) 875-125 MG tablet Take 1 tablet by mouth 2 (two) times daily.    apixaban (ELIQUIS) 5 MG TABS tablet TAKE 1 TABLET (5 MG) BY MOUTH TWICE DAILY    atorvastatin (LIPITOR) 80 MG tablet Take 1 tablet (80 mg total) by mouth  daily. TAKE 1 TABLET(80 MG) BY MOUTH EVERY EVENING    azelastine (ASTELIN) 0.1 % nasal spray Place 1 spray into both nostrils 2 (two) times daily. Use in each nostril as directed    buPROPion (WELLBUTRIN XL) 150 MG 24 hr tablet Take 3 tablets once daily    docusate sodium (COLACE) 100 MG capsule Take 100 mg by mouth daily as needed for mild constipation.    escitalopram (LEXAPRO) 20 MG tablet TAKE 1 TABLET(20 MG) BY MOUTH DAILY    ezetimibe (ZETIA) 10 MG tablet TAKE 1 TABLET BY MOUTH EVERY DAY    ferrous sulfate 325 (65 FE) MG tablet Take 325 mg by mouth daily with breakfast.    furosemide (LASIX) 40 MG tablet Take 1 tablet (40 mg total) by mouth daily.    magnesium gluconate (MAGONATE) 500 MG tablet Take 0.5 tablets (250 mg total) by mouth at bedtime.    metFORMIN (GLUCOPHAGE) 500 MG tablet TAKE 1 TABLET (500 MG) BY MOUTH TWICE DAILY WITH A MEAL.    Metoprolol Tartrate 75 MG TABS Take 75 mg by mouth 2 (two) times daily.    nitroGLYCERIN (NITROSTAT) 0.4 MG SL tablet Place 1 tablet (0.4 mg total) under the tongue every 5 (five) minutes as needed.    omeprazole (PRILOSEC) 40 MG capsule Take 1 capsule by mouth once daily 30 minutes before breakfast    potassium chloride SA (KLOR-CON M) 20 MEQ tablet Take 1 tablet (20 mEq total) by mouth daily.    predniSONE (DELTASONE) 20 MG tablet Take 1 tablet (20 mg total) by mouth daily with breakfast.  QUEtiapine (SEROQUEL) 50 MG tablet Take 1 tablet (50 mg total) by mouth at bedtime.    rOPINIRole (REQUIP) 3 MG tablet TAKE 1 TABLET BY MOUTH EVERY NIGHT AT BEDTIME    sucralfate (CARAFATE) 1 g tablet Take one tablet po BID PRN (Patient taking differently: Take 1 g by mouth 2 (two) times daily as needed (acid reflux).)    traMADol (ULTRAM) 50 MG tablet TAKE 1 TABLET BY MOUTH TWICE DAILY AS NEEDED FOR MODERATE PAIN. 10/21/2021: Didn't bring bottle, LD 10/20/2021   No facility-administered encounter medications on file as of 11/08/2021.    Patient Active Problem  List   Diagnosis Date Noted   Debility 10/21/2021   Acute respiratory failure ([place]) 08/25/2021   Sundowning 08/23/2021   Dysphagia 08/22/2021   Hypernatremia 08/22/2021   Obesity (BMI 30-39.9) 08/21/2021   Endotracheally intubated    Influenza    Acute respiratory failure with hypoxemia ([place]) 08/06/2021   Ventral hernia 12/31/2020   Nausea with vomiting 12/31/2019   Dyslipidemia, goal LDL below 70 06/18/2019   Hx of adenomatous colonic polyps 10/17/2018   Anemia 10/17/2018   Phimosis of penis 09/27/2018   Primary osteoarthritis of right hip 06/20/2018   Esophageal dysphagia 05/21/2018   GERD (gastroesophageal reflux disease) 05/21/2018   RLS (restless legs syndrome)    PUD (peptic ulcer disease)    Insomnia    Chronic diastolic CHF (congestive heart failure) ([place])    Asthma    Arteriosclerotic cardiovascular disease (ASCVD)    Chronic anticoagulation 01/27/2017   Depression 09/24/2015   COPD (chronic obstructive pulmonary disease) ([place]) 07/20/2015   Accelerating angina ([place]) 07/19/2015   Genetic testing 09/09/2014   H/O adenomatous polyp of colon 08/11/2014   Non-insulin treated type 2 diabetes mellitus ([place]) 02/09/2011   CAD S/P percutaneous coronary angioplasty    Tobacco abuse    Essential hypertension    Chronic atrial fibrillation ([place]) 02/23/2010   Obstructive sleep apnea 12/27/2009    Conditions to be addressed/monitored: DM2, CHF, CAD, Atrial Fib, HTN, HLD, COPD, Depression, Obesity, Debility, Sundowning, Tobacco Abuse, and Chronic Anticoagulation.  Limited Social Support, Mental Health Concerns, Social Isolation, Limited Access to Caregiver, Cognitive Deficits, Memory Deficits, and Lacks Knowledge of [employer].  Care Plan : LCSW Plan of Care  Updates made by Francis Gaines, LCSW since 11/08/2021 12:00 AM     Problem: Reduce and Manage My Symptoms of Depression.   Priority: High     Goal: Reduce and Manage My Symptoms of Depression.   Start  Date: 11/04/2021  Expected End Date: 02/04/2022  This Visit's Progress: On track  Recent Progress: On track  Priority: High  Note:   Current Barriers:   Acute Mental Health needs related to DM2, CHF, CAD, Atrial Fib, HTN, HLD, COPD, Depression, Obesity, Debility, Sundowning, Tobacco Abuse, and Chronic Anticoagulation, requires Support, Education, [name], Referrals, Advocacy, and Care Coordination, in order to meet unmet Acute Mental Health needs. Clinical Goal(s):  Patient will work with LCSW, to reduce and manage symptoms of Depression, until well-controlled.     Patient will increase knowledge and/or ability of:        Coping Skills, Healthy Habits, Self-Management Skills, Stress Reduction, Home Safety, and Utilizing [employer] and [name].   Interventions: Collaboration with Primary Care Provider, Shaun [name] regarding development and update of comprehensive plan of care, as evidenced by provider attestation and co-signature. Inter-disciplinary care team collaboration (see longitudinal plan of care). Clinical Interventions:  Mindfulness Meditation Strategies, Relaxation Techniques and Deep Breathing Exercises  reviewed, and encouraged daily. Solution-Focused Therapy performed. Verbalization of Feelings encouraged. Emotional Support provided. Client-Centered Therapy initiated. Support Group Participation encouraged. Increase Level of Activity/Exercise emphasized.  Patient Goals/Self-Care Activities: Continue to receive personal counseling with LCSW, on a bi-weekly basis, in an effort to reduce and manage symptoms of Depression, until well-controlled.   Continue to incorporate into daily practice - relaxation techniques, deep breathing exercises, and mindfulness meditation strategies. Review EMMI Educational Material on "Depression in Adults" and "Tips for How to Help Your Mood", mailed to your home on 11/09/2021, and be prepared to discuss next week. Contact LCSW directly (#  [phone]), if you have questions, need assistance, or if additional social work needs are identified between now and our next scheduled telephone outreach call.  Follow-Up:  11/18/2021 at 11:30 am  Patrick Clinical Social Worker [place] [phone]

## 2021-11-11 ENCOUNTER — Other Ambulatory Visit: Payer: Self-pay

## 2021-11-11 ENCOUNTER — Encounter (HOSPITAL_COMMUNITY): Payer: Self-pay | Admitting: Physical Therapy

## 2021-11-11 ENCOUNTER — Ambulatory Visit (HOSPITAL_COMMUNITY): Payer: Medicare Other | Attending: Physical Medicine & Rehabilitation | Admitting: Physical Therapy

## 2021-11-11 DIAGNOSIS — R5381 Other malaise: Secondary | ICD-10-CM | POA: Insufficient documentation

## 2021-11-11 DIAGNOSIS — M1611 Unilateral primary osteoarthritis, right hip: Secondary | ICD-10-CM | POA: Insufficient documentation

## 2021-11-11 DIAGNOSIS — M25551 Pain in right hip: Secondary | ICD-10-CM | POA: Diagnosis not present

## 2021-11-11 DIAGNOSIS — R2689 Other abnormalities of gait and mobility: Secondary | ICD-10-CM | POA: Insufficient documentation

## 2021-11-11 DIAGNOSIS — R29898 Other symptoms and signs involving the musculoskeletal system: Secondary | ICD-10-CM | POA: Diagnosis not present

## 2021-11-11 DIAGNOSIS — M6281 Muscle weakness (generalized): Secondary | ICD-10-CM | POA: Insufficient documentation

## 2021-11-11 NOTE — Therapy (Signed)
?OUTPATIENT PHYSICAL THERAPY LOWER EXTREMITY EVALUATION ? ? ?Patient Name: Shaun [name] ?MRN: [id number] ?DOB:01-25-1956, 66 y.o., male ?Today's Date: 11/11/2021 ? ? PT End of Session - 11/11/21 1051   ? ? Visit Number 1   ? Number of Visits 12   ? Date for PT Re-Evaluation 12/23/21   ? Authorization Type [employer] Medicare   ? Progress Note Due on Visit 10   ? PT Start Time 1052   ? PT Stop Time 1128   ? PT Time Calculation (min) 36 min   ? Activity Tolerance Patient tolerated treatment well   ? Behavior During Therapy [facility] for tasks assessed/performed   ? ?  ?  ? ?  ? ? ?Past Medical History:  ?Diagnosis Date  ? Anemia   ? Anxiety   ? Asthma   ? CAD (coronary artery disease)   ? DES to distal circumflex 2016  ? Cataract   ? Colon polyps   ? 30 colon polyps found on first colonoscopy  ? Diastolic heart failure ([place])   ? Diverticulitis   ? DJD (degenerative joint disease)   ? GERD (gastroesophageal reflux disease)   ? History of kidney stones   ? Hyperlipidemia   ? Hypertension   ? Insomnia   ? Obstructive sleep apnea 12/2009  ? 01/26/2010 AHI 83/hr  ? Permanent atrial fibrillation ([place])   ? Onset 2006 paroxysmal then progressive to persistent  ? PUD (peptic ulcer disease)   ? 1980s  ? RLS (restless legs syndrome)   ? Sinusitis   ? Skin cancer   ? Type 2 diabetes mellitus ([place])   ? ?Past Surgical History:  ?Procedure Laterality Date  ? BIOPSY  07/17/2018  ? Procedure: BIOPSY;  Surgeon: Danie Binder, MD;  [facility];  Service: Endoscopy;;  colon  ? BOWEL RESECTION  09/17/2018  ? SMALL BOWEL RESECTION: 71 CM   ? CARDIAC CATHETERIZATION N/A 07/21/2015  ? Procedure: Left Heart Cath and Coronary Angiography;  Surgeon: Peter M Martinique, MD;  Location: [place] CV LAB;  Service: Cardiovascular;  Laterality: N/A;  ? CARDIAC CATHETERIZATION N/A 07/21/2015  ? Procedure: Coronary Stent Intervention;  Surgeon: Peter M Martinique, MD;  Location: [place] CV LAB;  Service: Cardiovascular;  Laterality: N/A;  ?  CIRCUMCISION N/A 04/05/2019  ? Procedure: CIRCUMCISION ADULT;  Surgeon: Irine Seal, MD;  Location: [facility];  Service: Urology;  Laterality: N/A;  ? COLONOSCOPY N/A 05/19/2014  ? Dr. Barnie Alderman diverticulosis/moderate external hemorrhoids, >20 simple adenomas. Genetic screening negative.   ? COLONOSCOPY WITH PROPOFOL N/A 07/17/2018  ? Dr. Oneida Alar: Diverticulosis, external/internal hemorrhoids, 32 colon polyps removed.  ten tubular adenomas removed with no high-grade dysplasia.  Advised to have surveillance colonoscopy in 3 years.  ? COLONOSCOPY WITH PROPOFOL N/A 06/07/2021  ? Procedure: COLONOSCOPY WITH PROPOFOL;  Surgeon: Eloise Harman, DO;  [facility];  Service: Endoscopy;  Laterality: N/A;  9:30 / ASA 3  (Pt was told that his time will be given at Pre-op)  ? ESOPHAGOGASTRODUODENOSCOPY (EGD) WITH PROPOFOL N/A 07/17/2018  ? Dr. Oneida Alar: Low-grade narrowing Schatzki ring at the GE junction status post dilation.  Gastritis.  Biopsy with mild nonspecific reactive gastropathy.  No H. pylori.  ? GIVENS CAPSULE STUDY N/A 06/24/2019  ? normal  ? HERNIA REPAIR  1986  ? Left inguinal  ? INTRAVASCULAR PRESSURE WIRE/FFR STUDY Left 06/08/2017  ? Procedure: INTRAVASCULAR PRESSURE WIRE/FFR STUDY;  Surgeon: Nelva Bush, MD;  Location: [place] CV LAB;  Service: Cardiovascular;  Laterality: Left;  LAD and CFX  ? LAPAROTOMY N/A 09/17/2018  ? Procedure: EXPLORATORY LAPAROTOMY;  Surgeon: Virl Cagey, MD;  Location: [facility];  Service: General;  Laterality: N/A;  ? LEFT HEART CATH AND CORONARY ANGIOGRAPHY N/A 06/08/2017  ? Procedure: LEFT HEART CATH AND CORONARY ANGIOGRAPHY;  Surgeon: Nelva Bush, MD;  Location: [place] CV LAB;  Service: Cardiovascular;  Laterality: N/A;  ? POLYPECTOMY  07/17/2018  ? Procedure: POLYPECTOMY;  Surgeon: Danie Binder, MD;  [facility];  Service: Endoscopy;;  colon  ? POLYPECTOMY  06/07/2021  ? Procedure: POLYPECTOMY INTESTINAL;  Surgeon: Eloise Harman,  DO;  [facility];  Service: Endoscopy;;  ? ROTATOR CUFF REPAIR    ? Right  ? SAVORY DILATION N/A 07/17/2018  ? Procedure: SAVORY DILATION;  Surgeon: Danie Binder, MD;  [facility];  Service: Endoscopy;  Laterality: N/A;  ? ?Patient Active Problem List  ? Diagnosis Date Noted  ? Debility 10/21/2021  ? Acute respiratory failure ([place]) 08/25/2021  ? Sundowning 08/23/2021  ? Dysphagia 08/22/2021  ? Hypernatremia 08/22/2021  ? Obesity (BMI 30-39.9) 08/21/2021  ? Endotracheally intubated   ? Influenza   ? Acute respiratory failure with hypoxemia ([place]) 08/06/2021  ? Ventral hernia 12/31/2020  ? Nausea with vomiting 12/31/2019  ? Dyslipidemia, goal LDL below 70 06/18/2019  ? Hx of adenomatous colonic polyps 10/17/2018  ? Anemia 10/17/2018  ? Phimosis of penis 09/27/2018  ? Primary osteoarthritis of right hip 06/20/2018  ? Esophageal dysphagia 05/21/2018  ? GERD (gastroesophageal reflux disease) 05/21/2018  ? RLS (restless legs syndrome)   ? PUD (peptic ulcer disease)   ? Insomnia   ? Chronic diastolic CHF (congestive heart failure) ([place])   ? Asthma   ? Arteriosclerotic cardiovascular disease (ASCVD)   ? Chronic anticoagulation 01/27/2017  ? Depression 09/24/2015  ? COPD (chronic obstructive pulmonary disease) ([place]) 07/20/2015  ? Accelerating angina ([place]) 07/19/2015  ? Genetic testing 09/09/2014  ? H/O adenomatous polyp of colon 08/11/2014  ? Non-insulin treated type 2 diabetes mellitus ([place]) 02/09/2011  ? CAD S/P percutaneous coronary angioplasty   ? Tobacco abuse   ? Essential hypertension   ? Chronic atrial fibrillation ([place]) 02/23/2010  ? Obstructive sleep apnea 12/27/2009  ? ? ?PCP: Maximiano Coss, NP ? ?REFERRING PROVIDER: Charlett Blake, MD ? ?REFERRING DIAG: M16.11 (ICD-10-CM) - Primary osteoarthritis of right hip R53.81 (ICD-10-CM) - Debility  ? ?THERAPY DIAG:  ?Pain in right hip ? ?Muscle weakness (generalized) ? ?Other abnormalities of gait and mobility ? ?Other symptoms and signs  involving the musculoskeletal system ? ?Debility ? ?ONSET DATE: 08/06/21 ? ?SUBJECTIVE:  ? ?SUBJECTIVE STATEMENT: ?Patient was admitted for pneumonia last December 2022 with weakness following. Has had worsening hip pain due to R hip OA. He states chronic hip pain. He was on a vent for 10 days. He was in the hospital for 28 days.  ? ?PERTINENT HISTORY: ?Pneumonia 12/22, afib, COPD, CHF, DM, anxiety, depression ? ?PAIN:  ?Are you having pain? Yes: NPRS scale: 6/10 ?Pain location: R lateral hip ?Pain description: sharp ?Aggravating factors: rest and movement ?Relieving factors: recliner ? ?PRECAUTIONS: Fall ? ?WEIGHT BEARING RESTRICTIONS No ? ?FALLS:  ?Has patient fallen in last 6 months? Yes, Number of falls: 2 in Jan 2023 ? ?LIVING ENVIRONMENT: ?Lives with: lives with their spouse ?Lives in: House/apartment ?Stairs: Yes; External: 1 steps; none ?Has following equipment at home: Single point cane ? ?OCCUPATION: Retired -  was in Logistics at [employer] ? ?PLOF: Independent ? ?PATIENT GOALS Improve mobility ? ? ?OBJECTIVE:  ? ? ? ?PATIENT SURVEYS:  ?FOTO 42% function ? ?COGNITION: ? Overall cognitive status: Within functional limits for tasks assessed   ?  ?SENSATION: ?WFL ? ?OBSERVATION:  Ambulates without AD, unsteady with wide BOS, antalgic on L  ? ?POSTURE:  ?Frequently slouched with lateral lean in seated  ? ?PALPATION: ?TTP R glute med/min and max ? ?LE ROM: decreased R hip abd and extension > L  ? ?Active ROM Right ?11/11/2021 Left ?11/11/2021  ?Hip flexion    ?Hip extension    ?Hip abduction    ?Hip adduction    ?Hip internal rotation    ?Hip external rotation    ?Knee flexion    ?Knee extension    ?Ankle dorsiflexion    ?Ankle plantarflexion    ?Ankle inversion    ?Ankle eversion    ? (Blank rows = not tested) ? ?LE MMT: extension not tested as patient unable to lay prone ? ?MMT Right ?11/11/2021 Left ?11/11/2021  ?Hip flexion 4/5 4+/5  ?Hip extension    ?Hip abduction 3/5 3/5  ?Hip adduction    ?Hip internal rotation     ?Hip external rotation    ?Knee flexion 4+/5 5/5  ?Knee extension 4+/5 5/5  ?Ankle dorsiflexion 5/5 5/5  ?Ankle plantarflexion    ?Ankle inversion    ?Ankle eversion    ? (Blank rows = not tested) ? ? ? ?FUNCT

## 2021-11-12 ENCOUNTER — Other Ambulatory Visit (HOSPITAL_BASED_OUTPATIENT_CLINIC_OR_DEPARTMENT_OTHER): Payer: Self-pay

## 2021-11-15 ENCOUNTER — Other Ambulatory Visit: Payer: Self-pay

## 2021-11-15 ENCOUNTER — Ambulatory Visit (HOSPITAL_COMMUNITY): Payer: Medicare Other

## 2021-11-15 DIAGNOSIS — R2689 Other abnormalities of gait and mobility: Secondary | ICD-10-CM | POA: Diagnosis not present

## 2021-11-15 DIAGNOSIS — R29898 Other symptoms and signs involving the musculoskeletal system: Secondary | ICD-10-CM | POA: Diagnosis not present

## 2021-11-15 DIAGNOSIS — M6281 Muscle weakness (generalized): Secondary | ICD-10-CM | POA: Diagnosis not present

## 2021-11-15 DIAGNOSIS — R5381 Other malaise: Secondary | ICD-10-CM | POA: Diagnosis not present

## 2021-11-15 DIAGNOSIS — M25551 Pain in right hip: Secondary | ICD-10-CM | POA: Diagnosis not present

## 2021-11-15 DIAGNOSIS — M1611 Unilateral primary osteoarthritis, right hip: Secondary | ICD-10-CM | POA: Diagnosis not present

## 2021-11-15 NOTE — Therapy (Signed)
?OUTPATIENT PHYSICAL THERAPY TREATMENT NOTE ? ? ?Patient Name: Shaun [name] ?MRN: [id number] ?DOB:February 01, 1956, 66 y.o., male ?Today's Date: 11/15/2021 ? ?PCP: Maximiano Coss, NP ?REFERRING PROVIDER: Charlett Blake, MD ? ? PT End of Session - 11/15/21 1028   ? ? Visit Number 2   ? Number of Visits 12   ? Date for PT Re-Evaluation 12/23/21   ? Authorization Type [employer] Medicare   ? Progress Note Due on Visit 10   ? PT Start Time [phone]   ? PT Stop Time 1023   ? PT Time Calculation (min) 36 min   ? Activity Tolerance Patient tolerated treatment well   ? Behavior During Therapy [facility] for tasks assessed/performed   ? ?  ?  ? ?  ? ? ?Past Medical History:  ?Diagnosis Date  ? Anemia   ? Anxiety   ? Asthma   ? CAD (coronary artery disease)   ? DES to distal circumflex 2016  ? Cataract   ? Colon polyps   ? 30 colon polyps found on first colonoscopy  ? Diastolic heart failure ([place])   ? Diverticulitis   ? DJD (degenerative joint disease)   ? GERD (gastroesophageal reflux disease)   ? History of kidney stones   ? Hyperlipidemia   ? Hypertension   ? Insomnia   ? Obstructive sleep apnea 12/2009  ? 01/26/2010 AHI 83/hr  ? Permanent atrial fibrillation ([place])   ? Onset 2006 paroxysmal then progressive to persistent  ? PUD (peptic ulcer disease)   ? 1980s  ? RLS (restless legs syndrome)   ? Sinusitis   ? Skin cancer   ? Type 2 diabetes mellitus ([place])   ? ?Past Surgical History:  ?Procedure Laterality Date  ? BIOPSY  07/17/2018  ? Procedure: BIOPSY;  Surgeon: Danie Binder, MD;  [facility];  Service: Endoscopy;;  colon  ? BOWEL RESECTION  09/17/2018  ? SMALL BOWEL RESECTION: 71 CM   ? CARDIAC CATHETERIZATION N/A 07/21/2015  ? Procedure: Left Heart Cath and Coronary Angiography;  Surgeon: Peter M Martinique, MD;  Location: [place] [facility] LAB;  Service: Cardiovascular;  Laterality: N/A;  ? CARDIAC CATHETERIZATION N/A 07/21/2015  ? Procedure: Coronary Stent Intervention;  Surgeon: Peter M Martinique, MD;  Location: [place] CV  LAB;  Service: Cardiovascular;  Laterality: N/A;  ? CIRCUMCISION N/A 04/05/2019  ? Procedure: CIRCUMCISION ADULT;  Surgeon: Irine Seal, MD;  [facility]: [facility];  Service: Urology;  Laterality: N/A;  ? COLONOSCOPY N/A 05/19/2014  ? Dr. Barnie Alderman diverticulosis/moderate external hemorrhoids, >20 simple adenomas. Genetic screening negative.   ? COLONOSCOPY WITH PROPOFOL N/A 07/17/2018  ? Dr. Oneida Alar: Diverticulosis, external/internal hemorrhoids, 32 colon polyps removed.  ten tubular adenomas removed with no high-grade dysplasia.  Advised to have surveillance colonoscopy in 3 years.  ? COLONOSCOPY WITH PROPOFOL N/A 06/07/2021  ? Procedure: COLONOSCOPY WITH PROPOFOL;  Surgeon: Eloise Harman, DO;  [facility];  Service: Endoscopy;  Laterality: N/A;  9:30 / ASA 3  (Pt was told that his time will be given at Pre-op)  ? ESOPHAGOGASTRODUODENOSCOPY (EGD) WITH PROPOFOL N/A 07/17/2018  ? Dr. Oneida Alar: Low-grade narrowing Schatzki ring at the GE junction status post dilation.  Gastritis.  Biopsy with mild nonspecific reactive gastropathy.  No H. pylori.  ? GIVENS CAPSULE STUDY N/A 06/24/2019  ? normal  ? HERNIA REPAIR  1986  ? Left inguinal  ? INTRAVASCULAR PRESSURE WIRE/FFR STUDY Left 06/08/2017  ? Procedure: INTRAVASCULAR PRESSURE WIRE/FFR STUDY;  Surgeon:  End, Harrell Gave, MD;  Location: [place] CV LAB;  Service: Cardiovascular;  Laterality: Left;  LAD and CFX  ? LAPAROTOMY N/A 09/17/2018  ? Procedure: EXPLORATORY LAPAROTOMY;  Surgeon: Virl Cagey, MD;  [facility]: [facility];  Service: General;  Laterality: N/A;  ? LEFT HEART CATH AND CORONARY ANGIOGRAPHY N/A 06/08/2017  ? Procedure: LEFT HEART CATH AND CORONARY ANGIOGRAPHY;  Surgeon: Nelva Bush, MD;  Location: [place] [facility] LAB;  Service: Cardiovascular;  Laterality: N/A;  ? POLYPECTOMY  07/17/2018  ? Procedure: POLYPECTOMY;  Surgeon: Danie Binder, MD;  [facility];  Service: Endoscopy;;  colon  ? POLYPECTOMY  06/07/2021  ? Procedure:  POLYPECTOMY INTESTINAL;  Surgeon: Eloise Harman, DO;  [facility];  Service: Endoscopy;;  ? ROTATOR CUFF REPAIR    ? Right  ? SAVORY DILATION N/A 07/17/2018  ? Procedure: SAVORY DILATION;  Surgeon: Danie Binder, MD;  [facility];  Service: Endoscopy;  Laterality: N/A;  ? ?Patient Active Problem List  ? Diagnosis Date Noted  ? Debility 10/21/2021  ? Acute respiratory failure ([place]) 08/25/2021  ? Sundowning 08/23/2021  ? Dysphagia 08/22/2021  ? Hypernatremia 08/22/2021  ? Obesity (BMI 30-39.9) 08/21/2021  ? Endotracheally intubated   ? Influenza   ? Acute respiratory failure with hypoxemia ([place]) 08/06/2021  ? Ventral hernia 12/31/2020  ? Nausea with vomiting 12/31/2019  ? Dyslipidemia, goal LDL below 70 06/18/2019  ? Hx of adenomatous colonic polyps 10/17/2018  ? Anemia 10/17/2018  ? Phimosis of penis 09/27/2018  ? Primary osteoarthritis of right hip 06/20/2018  ? Esophageal dysphagia 05/21/2018  ? GERD (gastroesophageal reflux disease) 05/21/2018  ? RLS (restless legs syndrome)   ? PUD (peptic ulcer disease)   ? Insomnia   ? Chronic diastolic CHF (congestive heart failure) ([place])   ? Asthma   ? Arteriosclerotic cardiovascular disease (ASCVD)   ? Chronic anticoagulation 01/27/2017  ? Depression 09/24/2015  ? COPD (chronic obstructive pulmonary disease) ([place]) 07/20/2015  ? Accelerating angina ([place]) 07/19/2015  ? Genetic testing 09/09/2014  ? H/O adenomatous polyp of colon 08/11/2014  ? Non-insulin treated type 2 diabetes mellitus ([place]) 02/09/2011  ? CAD S/P percutaneous coronary angioplasty   ? Tobacco abuse   ? Essential hypertension   ? Chronic atrial fibrillation ([place]) 02/23/2010  ? Obstructive sleep apnea 12/27/2009  ? ? ?REFERRING DIAG: M16.11 (ICD-10-CM) - Primary osteoarthritis of right hip R53.81 (ICD-10-CM) - Debility   ? ?THERAPY DIAG:  ?Pain in right hip ? ?Muscle weakness (generalized) ? ?Other abnormalities of gait and mobility ? ?Other symptoms and signs involving the  musculoskeletal system ? ?PERTINENT HISTORY: Pneumonia 12/22, afib, COPD, CHF, DM, anxiety, depression ? ?PRECAUTIONS: Fall ? ?SUBJECTIVE: Patient had a [place] watching grandson play baseball and then more sore today. "It didn't both me much at all yesterday" HEP going well ? ?PAIN:  ?Are you having pain? Yes: NPRS scale: 6/10 ?Pain location: lateral/posterior  ?Pain description: sharp ?Aggravating factors: movement, sleep position ?Relieving factors: recliner ? ? ? ?OBJECTIVE:  ?  ?Below in italics are FROM EVALUATION =   ? PATIENT SURVEYS:  ?FOTO 42% function ?  ?OBSERVATION:  Ambulates without AD, unsteady with wide BOS, antalgic on L  ?  ?POSTURE:  ?Frequently slouched with lateral lean in seated  ?  ?PALPATION: ?TTP R glute med/min and max ?  ?LE ROM: decreased R hip abd and extension > L  ?  ?Active ROM Right ?11/11/2021 Left ?  11/11/2021  ?Hip flexion      ?Hip extension      ?Hip abduction      ?Hip adduction      ?Hip internal rotation      ?Hip external rotation      ?Knee flexion      ?Knee extension      ?Ankle dorsiflexion      ?Ankle plantarflexion      ?Ankle inversion      ?Ankle eversion      ? (Blank rows = not tested) ?  ?LE MMT: extension not tested as patient unable to lay prone ?  ?MMT Right ?11/11/2021 Left ?11/11/2021  ?Hip flexion 4/5 4+/5  ?Hip extension      ?Hip abduction 3/5 3/5  ?Hip adduction      ?Hip internal rotation      ?Hip external rotation      ?Knee flexion 4+/5 5/5  ?Knee extension 4+/5 5/5  ?Ankle dorsiflexion 5/5 5/5  ?Ankle plantarflexion      ?Ankle inversion      ?Ankle eversion      ? (Blank rows = not tested) ?  ?  ?  ?FUNCTIONAL TESTS:  ?5 times sit to stand: 13.15 with heavy bilateral UE support ?2 minute walk test: 345 feet ?Transfers: Bilateral UE use, labored ?  ?GAIT: ?Distance walked: 345 feet ?Assistive device utilized: None ?Level of assistance: Modified independence ?Comments: 2MWT, antalgic, decreased R hip extension, slight trunk  flexion for limited r hip extension, antalgic; worsening mechanics with fatigue ?  ?  ?  ?TODAY'S TREATMENT: ?11/15/21:  ?Supine/Sidelying =  ?Clam 2x 10 RLE  cue for slow pace ?Bridge  2x 10  cue for feet apart ?LT

## 2021-11-17 ENCOUNTER — Ambulatory Visit (HOSPITAL_COMMUNITY): Payer: Medicare Other

## 2021-11-17 ENCOUNTER — Other Ambulatory Visit (HOSPITAL_BASED_OUTPATIENT_CLINIC_OR_DEPARTMENT_OTHER): Payer: Self-pay

## 2021-11-17 ENCOUNTER — Other Ambulatory Visit: Payer: Self-pay

## 2021-11-17 ENCOUNTER — Encounter (HOSPITAL_COMMUNITY): Payer: Self-pay

## 2021-11-17 DIAGNOSIS — M25551 Pain in right hip: Secondary | ICD-10-CM | POA: Diagnosis not present

## 2021-11-17 DIAGNOSIS — R2689 Other abnormalities of gait and mobility: Secondary | ICD-10-CM

## 2021-11-17 DIAGNOSIS — R29898 Other symptoms and signs involving the musculoskeletal system: Secondary | ICD-10-CM | POA: Diagnosis not present

## 2021-11-17 DIAGNOSIS — M1611 Unilateral primary osteoarthritis, right hip: Secondary | ICD-10-CM | POA: Diagnosis not present

## 2021-11-17 DIAGNOSIS — R5381 Other malaise: Secondary | ICD-10-CM | POA: Diagnosis not present

## 2021-11-17 DIAGNOSIS — M6281 Muscle weakness (generalized): Secondary | ICD-10-CM | POA: Diagnosis not present

## 2021-11-17 NOTE — Patient Instructions (Signed)
Tandem Stance ? ? ? ?Right foot in front of left, heel touching toe both feet "straight ahead". Stand on Foot Triangle of Support with both feet.  ?Balance in this position 30 seconds. ?Do with left foot in front of right. ? ?Copyright ? VHI. All rights reserved.  ? ?Toe / Heel Raise (Standing) ? ? ? ?Standing with support, raise heels, then rock back on heels and raise toes. ?Repeat 15 times. ? ?Copyright ? VHI. All rights reserved.  ? ? ? ?

## 2021-11-17 NOTE — Therapy (Signed)
?OUTPATIENT PHYSICAL THERAPY TREATMENT NOTE ? ? ?Patient Name: Shaun [name] ?MRN: [id number] ?DOB:1955-11-17, 66 y.o., male ?Today's Date: 11/17/2021 ? ?PCP: Maximiano Coss, NP ?REFERRING PROVIDER: Charlett Blake, MD ? ? PT End of Session - 11/17/21 1032   ? ? Visit Number 3   ? Number of Visits 12   ? Date for PT Re-Evaluation 12/23/21   ? Authorization Type UHC Medicare   ? Progress Note Due on Visit 10   ? PT Start Time 1003   ? PT Stop Time 1224   ? PT Time Calculation (min) 39 min   ? Activity Tolerance Patient tolerated treatment well   ? Behavior During Therapy [facility] for tasks assessed/performed   ? ?  ?  ? ?  ? ? ? ?Past Medical History:  ?Diagnosis Date  ? Anemia   ? Anxiety   ? Asthma   ? CAD (coronary artery disease)   ? DES to distal circumflex 2016  ? Cataract   ? Colon polyps   ? 30 colon polyps found on first colonoscopy  ? Diastolic heart failure ([place])   ? Diverticulitis   ? DJD (degenerative joint disease)   ? GERD (gastroesophageal reflux disease)   ? History of kidney stones   ? Hyperlipidemia   ? Hypertension   ? Insomnia   ? Obstructive sleep apnea 12/2009  ? 01/26/2010 AHI 83/hr  ? Permanent atrial fibrillation ([place])   ? Onset 2006 paroxysmal then progressive to persistent  ? PUD (peptic ulcer disease)   ? 1980s  ? RLS (restless legs syndrome)   ? Sinusitis   ? Skin cancer   ? Type 2 diabetes mellitus ([place])   ? ?Past Surgical History:  ?Procedure Laterality Date  ? BIOPSY  07/17/2018  ? Procedure: BIOPSY;  Surgeon: Danie Binder, MD;  [facility];  Service: Endoscopy;;  colon  ? BOWEL RESECTION  09/17/2018  ? SMALL BOWEL RESECTION: 71 CM   ? CARDIAC CATHETERIZATION N/A 07/21/2015  ? Procedure: Left Heart Cath and Coronary Angiography;  Surgeon: Peter M Martinique, MD;  Location: [place] CV LAB;  Service: Cardiovascular;  Laterality: N/A;  ? CARDIAC CATHETERIZATION N/A 07/21/2015  ? Procedure: Coronary Stent Intervention;  Surgeon: Peter M Martinique, MD;  Location: [place]  CV LAB;  Service: Cardiovascular;  Laterality: N/A;  ? CIRCUMCISION N/A 04/05/2019  ? Procedure: CIRCUMCISION ADULT;  Surgeon: Irine Seal, MD;  [facility];  Service: Urology;  Laterality: N/A;  ? COLONOSCOPY N/A 05/19/2014  ? Dr. Barnie Alderman diverticulosis/moderate external hemorrhoids, >20 simple adenomas. Genetic screening negative.   ? COLONOSCOPY WITH PROPOFOL N/A 07/17/2018  ? Dr. Oneida Alar: Diverticulosis, external/internal hemorrhoids, 32 colon polyps removed.  ten tubular adenomas removed with no high-grade dysplasia.  Advised to have surveillance colonoscopy in 3 years.  ? COLONOSCOPY WITH PROPOFOL N/A 06/07/2021  ? Procedure: COLONOSCOPY WITH PROPOFOL;  Surgeon: Eloise Harman, DO;  [facility];  Service: Endoscopy;  Laterality: N/A;  9:30 / ASA 3  (Pt was told that his time will be given at Pre-op)  ? ESOPHAGOGASTRODUODENOSCOPY (EGD) WITH PROPOFOL N/A 07/17/2018  ? Dr. Oneida Alar: Low-grade narrowing Schatzki ring at the GE junction status post dilation.  Gastritis.  Biopsy with mild nonspecific reactive gastropathy.  No H. pylori.  ? GIVENS CAPSULE STUDY N/A 06/24/2019  ? normal  ? HERNIA REPAIR  1986  ? Left inguinal  ? INTRAVASCULAR PRESSURE WIRE/FFR STUDY Left 06/08/2017  ? Procedure: INTRAVASCULAR PRESSURE WIRE/FFR STUDY;  Surgeon: Nelva Bush, MD;  Location: [name] [facility] LAB;  Service: Cardiovascular;  Laterality: Left;  LAD and CFX  ? LAPAROTOMY N/A 09/17/2018  ? Procedure: EXPLORATORY LAPAROTOMY;  Surgeon: Virl Cagey, MD;  Location: [facility];  Service: General;  Laterality: N/A;  ? LEFT HEART CATH AND CORONARY ANGIOGRAPHY N/A 06/08/2017  ? Procedure: LEFT HEART CATH AND CORONARY ANGIOGRAPHY;  Surgeon: Nelva Bush, MD;  Location: [place] [facility];  Service: Cardiovascular;  Laterality: N/A;  ? POLYPECTOMY  07/17/2018  ? Procedure: POLYPECTOMY;  Surgeon: Danie Binder, MD;  [facility];  Service: Endoscopy;;  colon  ? POLYPECTOMY  06/07/2021  ? Procedure:  POLYPECTOMY INTESTINAL;  Surgeon: Eloise Harman, DO;  [facility];  Service: Endoscopy;;  ? ROTATOR CUFF REPAIR    ? Right  ? SAVORY DILATION N/A 07/17/2018  ? Procedure: SAVORY DILATION;  Surgeon: Danie Binder, MD;  [facility];  Service: Endoscopy;  Laterality: N/A;  ? ?Patient Active Problem List  ? Diagnosis Date Noted  ? Debility 10/21/2021  ? Acute respiratory failure ([place]) 08/25/2021  ? Sundowning 08/23/2021  ? Dysphagia 08/22/2021  ? Hypernatremia 08/22/2021  ? Obesity (BMI 30-39.9) 08/21/2021  ? Endotracheally intubated   ? Influenza   ? Acute respiratory failure with hypoxemia ([place]) 08/06/2021  ? Ventral hernia 12/31/2020  ? Nausea with vomiting 12/31/2019  ? Dyslipidemia, goal LDL below 70 06/18/2019  ? Hx of adenomatous colonic polyps 10/17/2018  ? Anemia 10/17/2018  ? Phimosis of penis 09/27/2018  ? Primary osteoarthritis of right hip 06/20/2018  ? Esophageal dysphagia 05/21/2018  ? GERD (gastroesophageal reflux disease) 05/21/2018  ? RLS (restless legs syndrome)   ? PUD (peptic ulcer disease)   ? Insomnia   ? Chronic diastolic CHF (congestive heart failure) ([place])   ? Asthma   ? Arteriosclerotic cardiovascular disease (ASCVD)   ? Chronic anticoagulation 01/27/2017  ? Depression 09/24/2015  ? COPD (chronic obstructive pulmonary disease) ([place]) 07/20/2015  ? Accelerating angina ([place]) 07/19/2015  ? Genetic testing 09/09/2014  ? H/O adenomatous polyp of colon 08/11/2014  ? Non-insulin treated type 2 diabetes mellitus ([place]) 02/09/2011  ? CAD S/P percutaneous coronary angioplasty   ? Tobacco abuse   ? Essential hypertension   ? Chronic atrial fibrillation ([place]) 02/23/2010  ? Obstructive sleep apnea 12/27/2009  ? ? ?REFERRING DIAG: M16.11 (ICD-10-CM) - Primary osteoarthritis of right hip R53.81 (ICD-10-CM) - Debility   ? ?THERAPY DIAG:  ?Pain in right hip ? ?Muscle weakness (generalized) ? ?Other abnormalities of gait and mobility ? ?Other symptoms and signs involving the  musculoskeletal system ? ?PERTINENT HISTORY: Pneumonia 12/22, afib, COPD, CHF, DM, anxiety, depression ? ?PRECAUTIONS: Fall ? ?SUBJECTIVE: Pt stated he is sore with some intermittent stabbing movements with longer periods standing/walking.  Reports compliance with HEP. ? ?PAIN:  ?Are you having pain? Yes: NPRS scale: 6/10 ?Pain location: lateral/posterior  ?Pain description: sharp ?Aggravating factors: movement, sleep position ?Relieving factors: recliner ? ? ? ?OBJECTIVE:  ?  ?Below in italics are FROM EVALUATION =   ? PATIENT SURVEYS:  ?FOTO 42% function ?  ?OBSERVATION:  Ambulates without AD, unsteady with wide BOS, antalgic on L  ?  ?POSTURE:  ?Frequently slouched with lateral lean in seated  ?  ?PALPATION: ?TTP R glute med/min and max ?  ?LE ROM: decreased R hip abd and extension > L  ?  ?Active ROM Right ?11/11/2021 Left ?11/11/2021  ?Hip flexion      ?Hip  extension      ?Hip abduction      ?Hip adduction      ?Hip internal rotation      ?Hip external rotation      ?Knee flexion      ?Knee extension      ?Ankle dorsiflexion      ?Ankle plantarflexion      ?Ankle inversion      ?Ankle eversion      ? (Blank rows = not tested) ?  ?LE MMT: extension not tested as patient unable to lay prone ?  ?MMT Right ?11/11/2021 Left ?11/11/2021  ?Hip flexion 4/5 4+/5  ?Hip extension      ?Hip abduction 3/5 3/5  ?Hip adduction      ?Hip internal rotation      ?Hip external rotation      ?Knee flexion 4+/5 5/5  ?Knee extension 4+/5 5/5  ?Ankle dorsiflexion 5/5 5/5  ?Ankle plantarflexion      ?Ankle inversion      ?Ankle eversion      ? (Blank rows = not tested) ?  ?  ?  ?FUNCTIONAL TESTS:  ?5 times sit to stand: 13.15 with heavy bilateral UE support ?2 minute walk test: 345 feet ?Transfers: Bilateral UE use, labored ?  ?GAIT: ?Distance walked: 345 feet ?Assistive device utilized: None ?Level of assistance: Modified independence ?Comments: 2MWT, antalgic, decreased R hip extension, slight trunk flexion for limited r hip extension,  antalgic; worsening mechanics with fatigue ?  ?  ?  ?TODAY'S TREATMENT: ?11/17/21: ?Standing: ? Rockerboard 1 min lateral ? Heel/toe raises 10x ? Abd BLE 10x ? Hip extension 10x 3" ? 3D hip excursion  (weight

## 2021-11-18 ENCOUNTER — Ambulatory Visit: Payer: Medicare Other | Admitting: *Deleted

## 2021-11-18 DIAGNOSIS — J439 Emphysema, unspecified: Secondary | ICD-10-CM

## 2021-11-18 DIAGNOSIS — E1165 Type 2 diabetes mellitus with hyperglycemia: Secondary | ICD-10-CM

## 2021-11-18 DIAGNOSIS — F321 Major depressive disorder, single episode, moderate: Secondary | ICD-10-CM

## 2021-11-18 DIAGNOSIS — R270 Ataxia, unspecified: Secondary | ICD-10-CM

## 2021-11-18 DIAGNOSIS — E785 Hyperlipidemia, unspecified: Secondary | ICD-10-CM

## 2021-11-18 DIAGNOSIS — Z720 Tobacco use: Secondary | ICD-10-CM

## 2021-11-18 DIAGNOSIS — I5032 Chronic diastolic (congestive) heart failure: Secondary | ICD-10-CM

## 2021-11-18 DIAGNOSIS — I10 Essential (primary) hypertension: Secondary | ICD-10-CM

## 2021-11-18 NOTE — Chronic Care Management (AMB) (Signed)
?Chronic Care Management  ? ? Clinical Social Work Note ? ?11/18/2021 ?Name: Shaun [name] MRN: [id number] DOB: 08/19/1956 ? ?Shaun [name] is a 66 y.o. year old male who is a primary care patient of Maximiano Coss, NP. The CCM team was consulted to assist the patient with chronic disease management and/or care coordination needs related to: Mental Health Counseling and Resources.  ? ?Engaged with patient by telephone for follow up visit in response to provider referral for social work chronic care management and care coordination services.  ? ?Consent to Services:  ?The patient was given information about Chronic Care Management services, agreed to services, and gave verbal consent prior to initiation of services.  Please see initial visit note for detailed documentation.  ? ?Patient agreed to services and consent obtained.  ? ?Assessment: Review of patient past medical history, allergies, medications, and health status, including review of relevant consultants reports was performed today as part of a comprehensive evaluation and provision of chronic care management and care coordination services.    ? ?SDOH (Social Determinants of Health) assessments and interventions performed:   ? ?Advanced Directives Status: Not addressed in this encounter. ? ?CCM Care Plan ? ?No Known Allergies ? ?Outpatient Encounter Medications as of 11/18/2021  ?Medication Sig Note  ? acetaminophen (TYLENOL) 325 MG tablet Take 2 tablets (650 mg total) by mouth every 6 (six) hours as needed for mild pain.   ? albuterol (VENTOLIN HFA) 108 (90 Base) MCG/ACT inhaler INHALE 2 PUFFS INTO THE LUNGS EVERY 6 HOURS AS NEEDED FOR WHEEZING OR SHORTNESS OF BREATH   ? amoxicillin-clavulanate (AUGMENTIN) 875-125 MG tablet Take 1 tablet by mouth 2 (two) times daily.   ? apixaban (ELIQUIS) 5 MG TABS tablet TAKE 1 TABLET (5 MG) BY MOUTH TWICE DAILY   ? atorvastatin (LIPITOR) 80 MG tablet Take 1 tablet (80 mg total) by mouth daily. TAKE 1 TABLET(80  MG) BY MOUTH EVERY EVENING   ? azelastine (ASTELIN) 0.1 % nasal spray Place 1 spray into both nostrils 2 (two) times daily. Use in each nostril as directed   ? buPROPion (WELLBUTRIN XL) 150 MG 24 hr tablet Take 3 tablets once daily   ? docusate sodium (COLACE) 100 MG capsule Take 100 mg by mouth daily as needed for mild constipation.   ? escitalopram (LEXAPRO) 20 MG tablet TAKE 1 TABLET(20 MG) BY MOUTH DAILY   ? ezetimibe (ZETIA) 10 MG tablet TAKE 1 TABLET BY MOUTH EVERY DAY   ? ferrous sulfate 325 (65 FE) MG tablet Take 325 mg by mouth daily with breakfast.   ? furosemide (LASIX) 40 MG tablet Take 1 tablet (40 mg total) by mouth daily.   ? magnesium gluconate (MAGONATE) 500 MG tablet Take 0.5 tablets (250 mg total) by mouth at bedtime.   ? metFORMIN (GLUCOPHAGE) 500 MG tablet TAKE 1 TABLET (500 MG) BY MOUTH TWICE DAILY WITH A MEAL.   ? Metoprolol Tartrate 75 MG TABS Take 75 mg by mouth 2 (two) times daily.   ? nitroGLYCERIN (NITROSTAT) 0.4 MG SL tablet Place 1 tablet (0.4 mg total) under the tongue every 5 (five) minutes as needed.   ? omeprazole (PRILOSEC) 40 MG capsule Take 1 capsule by mouth once daily 30 minutes before breakfast   ? potassium chloride SA (KLOR-CON M) 20 MEQ tablet Take 1 tablet (20 mEq total) by mouth daily.   ? predniSONE (DELTASONE) 20 MG tablet Take 1 tablet (20 mg total) by mouth daily with breakfast.   ? QUEtiapine (  SEROQUEL) 50 MG tablet Take 1 tablet (50 mg total) by mouth at bedtime.   ? rOPINIRole (REQUIP) 3 MG tablet TAKE 1 TABLET BY MOUTH EVERY NIGHT AT BEDTIME   ? sucralfate (CARAFATE) 1 g tablet Take one tablet po BID PRN (Patient taking differently: Take 1 g by mouth 2 (two) times daily as needed (acid reflux).)   ? traMADol (ULTRAM) 50 MG tablet TAKE 1 TABLET BY MOUTH TWICE DAILY AS NEEDED FOR MODERATE PAIN. 10/21/2021: Didn't bring bottle, LD 10/20/2021  ? ?No facility-administered encounter medications on file as of 11/18/2021.  ? ? ?Patient Active Problem List  ? Diagnosis Date  Noted  ? Debility 10/21/2021  ? Acute respiratory failure ([place]) 08/25/2021  ? Sundowning 08/23/2021  ? Dysphagia 08/22/2021  ? Hypernatremia 08/22/2021  ? Obesity (BMI 30-39.9) 08/21/2021  ? Endotracheally intubated   ? Influenza   ? Acute respiratory failure with hypoxemia ([place]) 08/06/2021  ? Ventral hernia 12/31/2020  ? Nausea with vomiting 12/31/2019  ? Dyslipidemia, goal LDL below 70 06/18/2019  ? Hx of adenomatous colonic polyps 10/17/2018  ? Anemia 10/17/2018  ? Phimosis of penis 09/27/2018  ? Primary osteoarthritis of right hip 06/20/2018  ? Esophageal dysphagia 05/21/2018  ? GERD (gastroesophageal reflux disease) 05/21/2018  ? RLS (restless legs syndrome)   ? PUD (peptic ulcer disease)   ? Insomnia   ? Chronic diastolic CHF (congestive heart failure) ([place])   ? Asthma   ? Arteriosclerotic cardiovascular disease (ASCVD)   ? Chronic anticoagulation 01/27/2017  ? Depression 09/24/2015  ? COPD (chronic obstructive pulmonary disease) ([place]) 07/20/2015  ? Accelerating angina ([place]) 07/19/2015  ? Genetic testing 09/09/2014  ? H/O adenomatous polyp of colon 08/11/2014  ? Non-insulin treated type 2 diabetes mellitus ([place]) 02/09/2011  ? CAD S/P percutaneous coronary angioplasty   ? Tobacco abuse   ? Essential hypertension   ? Chronic atrial fibrillation ([place]) 02/23/2010  ? Obstructive sleep apnea 12/27/2009  ? ? ?Conditions to be addressed/monitored:  DM2, CHF, CAD, Atrial Fib, HTN, HLD, COPD, Depression, Obesity, Debility, Sundowning, Tobacco Abuse, and Chronic Anticoagulation.  Limited Social Support, Mental Health Concerns, Cognitive Deficits, Memory Deficits, and Lacks Knowledge of [employer]. ? ?Care Plan : LCSW Plan of Care  ?Updates made by Francis Gaines, LCSW since 11/18/2021 12:00 AM  ?  ? ?Problem: Reduce and Manage My Symptoms of Depression.   ?Priority: High  ?  ? ?Goal: Reduce and Manage My Symptoms of Depression.   ?Start Date: 11/04/2021  ?Expected End Date: 02/04/2022  ?This Visit's Progress: On  track  ?Recent Progress: On track  ?Priority: High  ?Note:   ?Current Barriers:   ?Acute Mental Health needs related to DM2, CHF, CAD, Atrial Fib, HTN, HLD, COPD, Depression, Obesity, Debility, Sundowning, Tobacco Abuse, and Chronic Anticoagulation, requires Support, Education, Resources, Referrals, Advocacy, and Care Coordination, in order to meet unmet Acute Mental Health needs. ?Clinical Goal(s):  ?Patient will work with LCSW, to reduce and manage symptoms of Depression, until well-controlled.     ?Patient will increase knowledge and/or ability of:  ?      Coping Skills, Healthy Habits, Self-Management Skills, Stress Reduction, Home Safety, and Utilizing [employer] and Resources.   ?Interventions: ?Collaboration with Primary Care Provider, Maximiano Coss regarding development and update of comprehensive plan of care, as evidenced by provider attestation and co-signature. ?Inter-disciplinary care team collaboration (see longitudinal plan of care). ?Clinical Interventions:  ?Mindfulness Meditation Strategies, Relaxation Techniques and Deep Breathing Exercises reinforced daily. ?Verbalization of Feelings encouraged. ?  Emotional Support provided. ?Client-Centered Therapy initiated. ?Cognitive Behavioral Therapy integrated. ?Support Group Participation encouraged. ?Increase Level of Activity/Exercise emphasized.  ?Patient Goals/Self-Care Activities: ?Continue to receive personal counseling with LCSW, on a bi-weekly basis, in an effort to reduce and manage symptoms of Depression, until well-controlled.   ?Continue to incorporate into daily practice - relaxation techniques, deep breathing exercises, and mindfulness meditation strategies. ?Thorough review of [employer] Educational Material on "Depression in Adults" and "Tips for How to Help Your Mood", to ensure understanding.   ?Contact LCSW directly (# [phone]), if you have questions, need assistance, or if additional social work needs are identified between now  and our next scheduled telephone outreach call.  ?Follow-Up:  12/09/2021 at 12:45 pm  ?Nat Christen LCSW ?Licensed Clinical Social Worker ?[facility] ?[phone]  ? ?

## 2021-11-18 NOTE — Patient Instructions (Signed)
Visit Information ? ?Thank you for taking time to visit with me today. Please don't hesitate to contact me if I can be of assistance to you before our next scheduled telephone appointment. ? ?Following are the goals we discussed today:  ?Patient Goals/Self-Care Activities: ?Continue to receive personal counseling with LCSW, on a bi-weekly basis, in an effort to reduce and manage symptoms of Depression, until well-controlled.   ?Continue to incorporate into daily practice - relaxation techniques, deep breathing exercises, and mindfulness meditation strategies. ?Thorough review of EMMI Educational Material on "Depression in Adults" and "Tips for How to Help Your Mood", to ensure understanding.   ?Contact LCSW directly (# [phone]), if you have questions, need assistance, or if additional social work needs are identified between now and our next scheduled telephone outreach call.  ?Follow-Up:  12/09/2021 at 12:45 pm ? ?Please call the care guide team at [phone] if you need to cancel or reschedule your appointment.  ? ?If you are experiencing a Mental Health or [place] or need someone to talk to, please call the Suicide and Crisis Lifeline: 988 ?call the [place] National Suicide Prevention Lifeline: (501) 167-3082 or TTY: (810)299-0192 TTY 463-347-0468) to talk to a trained counselor ?call 1-800-273-TALK (toll free, 24 hour hotline) ?go to [facility] [address] [place] [phone]) ?call the [facility]: [phone] ?call 911  ? ?Patient verbalizes understanding of instructions and care plan provided today and agrees to view in [place]. Active MyChart status confirmed with patient.   ? ?Nat Christen LCSW ?Licensed Clinical Social Worker ?[facility] ?[phone]  ?

## 2021-11-22 ENCOUNTER — Ambulatory Visit (HOSPITAL_COMMUNITY): Payer: Medicare Other

## 2021-11-22 ENCOUNTER — Other Ambulatory Visit: Payer: Self-pay

## 2021-11-22 ENCOUNTER — Other Ambulatory Visit: Payer: Self-pay | Admitting: Registered Nurse

## 2021-11-22 ENCOUNTER — Other Ambulatory Visit (HOSPITAL_BASED_OUTPATIENT_CLINIC_OR_DEPARTMENT_OTHER): Payer: Self-pay

## 2021-11-22 ENCOUNTER — Encounter (HOSPITAL_COMMUNITY): Payer: Self-pay

## 2021-11-22 DIAGNOSIS — M1611 Unilateral primary osteoarthritis, right hip: Secondary | ICD-10-CM | POA: Diagnosis not present

## 2021-11-22 DIAGNOSIS — R5381 Other malaise: Secondary | ICD-10-CM | POA: Diagnosis not present

## 2021-11-22 DIAGNOSIS — M25551 Pain in right hip: Secondary | ICD-10-CM | POA: Diagnosis not present

## 2021-11-22 DIAGNOSIS — R2689 Other abnormalities of gait and mobility: Secondary | ICD-10-CM | POA: Diagnosis not present

## 2021-11-22 DIAGNOSIS — R29898 Other symptoms and signs involving the musculoskeletal system: Secondary | ICD-10-CM | POA: Diagnosis not present

## 2021-11-22 DIAGNOSIS — M6281 Muscle weakness (generalized): Secondary | ICD-10-CM | POA: Diagnosis not present

## 2021-11-22 DIAGNOSIS — E119 Type 2 diabetes mellitus without complications: Secondary | ICD-10-CM

## 2021-11-22 MED ORDER — METFORMIN HCL 500 MG PO TABS
ORAL_TABLET | ORAL | 0 refills | Status: DC
  Filled 2021-11-22: qty 180, 90d supply, fill #0

## 2021-11-22 NOTE — Therapy (Addendum)
?OUTPATIENT PHYSICAL THERAPY TREATMENT NOTE ? ? ?Patient Name: Shaun [name] ?MRN: [id number] ?DOB:July 16, 1956, 66 y.o., male ?Today's Date: 11/25/2021 ? ?PCP: Maximiano Coss, NP ?REFERRING PROVIDER: Charlett Blake, MD ? ?PT visits - number 4 ?Number of visits 12 ?Date for PT re-evaluation 12/23/21 ?[place] Medicare with 10 visit progress note ?PT time calculation 0948a to 1028a  40 mins ?PT end of session - Patient tolerated treatment well, WFL for tasks assessed/preformed ? ? ? ?Past Medical History:  ?Diagnosis Date  ? Anemia   ? Anxiety   ? Asthma   ? CAD (coronary artery disease)   ? DES to distal circumflex 2016  ? Cataract   ? Colon polyps   ? 30 colon polyps found on first colonoscopy  ? Diastolic heart failure ([place])   ? Diverticulitis   ? DJD (degenerative joint disease)   ? GERD (gastroesophageal reflux disease)   ? History of kidney stones   ? Hyperlipidemia   ? Hypertension   ? Insomnia   ? Obstructive sleep apnea 12/2009  ? 01/26/2010 AHI 83/hr  ? Permanent atrial fibrillation ([place])   ? Onset 2006 paroxysmal then progressive to persistent  ? PUD (peptic ulcer disease)   ? 1980s  ? RLS (restless legs syndrome)   ? Sinusitis   ? Skin cancer   ? Type 2 diabetes mellitus ([place])   ? ?Past Surgical History:  ?Procedure Laterality Date  ? BIOPSY  07/17/2018  ? Procedure: BIOPSY;  Surgeon: Danie Binder, MD;  [facility];  Service: Endoscopy;;  colon  ? BOWEL RESECTION  09/17/2018  ? SMALL BOWEL RESECTION: 71 CM   ? CARDIAC CATHETERIZATION N/A 07/21/2015  ? Procedure: Left Heart Cath and Coronary Angiography;  Surgeon: Peter M Martinique, MD;  Location: [facility];  Service: Cardiovascular;  Laterality: N/A;  ? CARDIAC CATHETERIZATION N/A 07/21/2015  ? Procedure: Coronary Stent Intervention;  Surgeon: Peter M Martinique, MD;  Location: [facility];  Service: Cardiovascular;  Laterality: N/A;  ? CIRCUMCISION N/A 04/05/2019  ? Procedure: CIRCUMCISION ADULT;  Surgeon: Irine Seal, MD;   Location: [facility];  Service: Urology;  Laterality: N/A;  ? COLONOSCOPY N/A 05/19/2014  ? Dr. Barnie Alderman diverticulosis/moderate external hemorrhoids, >20 simple adenomas. Genetic screening negative.   ? COLONOSCOPY WITH PROPOFOL N/A 07/17/2018  ? Dr. Oneida Alar: Diverticulosis, external/internal hemorrhoids, 32 colon polyps removed.  ten tubular adenomas removed with no high-grade dysplasia.  Advised to have surveillance colonoscopy in 3 years.  ? COLONOSCOPY WITH PROPOFOL N/A 06/07/2021  ? Procedure: COLONOSCOPY WITH PROPOFOL;  Surgeon: Eloise Harman, DO;  [facility];  Service: Endoscopy;  Laterality: N/A;  9:30 / ASA 3  (Pt was told that his time will be given at Pre-op)  ? ESOPHAGOGASTRODUODENOSCOPY (EGD) WITH PROPOFOL N/A 07/17/2018  ? Dr. Oneida Alar: Low-grade narrowing Schatzki ring at the GE junction status post dilation.  Gastritis.  Biopsy with mild nonspecific reactive gastropathy.  No H. pylori.  ? GIVENS CAPSULE STUDY N/A 06/24/2019  ? normal  ? HERNIA REPAIR  1986  ? Left inguinal  ? INTRAVASCULAR PRESSURE WIRE/FFR STUDY Left 06/08/2017  ? Procedure: INTRAVASCULAR PRESSURE WIRE/FFR STUDY;  Surgeon: Nelva Bush, MD;  Location: [place];  Service: Cardiovascular;  Laterality: Left;  LAD and CFX  ? LAPAROTOMY N/A 09/17/2018  ? Procedure: EXPLORATORY LAPAROTOMY;  Surgeon: Virl Cagey, MD;  Location: [facility];  Service: General;  Laterality: N/A;  ? LEFT HEART CATH AND CORONARY  ANGIOGRAPHY N/A 06/08/2017  ? Procedure: LEFT HEART CATH AND CORONARY ANGIOGRAPHY;  Surgeon: Nelva Bush, MD;  Location: [place] [facility];  Service: Cardiovascular;  Laterality: N/A;  ? POLYPECTOMY  07/17/2018  ? Procedure: POLYPECTOMY;  Surgeon: Danie Binder, MD;  [facility];  Service: Endoscopy;;  colon  ? POLYPECTOMY  06/07/2021  ? Procedure: POLYPECTOMY INTESTINAL;  Surgeon: Eloise Harman, DO;  [facility];  Service: Endoscopy;;  ? ROTATOR CUFF REPAIR    ? Right  ?  SAVORY DILATION N/A 07/17/2018  ? Procedure: SAVORY DILATION;  Surgeon: Danie Binder, MD;  [facility];  Service: Endoscopy;  Laterality: N/A;  ? ?Patient Active Problem List  ? Diagnosis Date Noted  ? Debility 10/21/2021  ? Acute respiratory failure ([place]) 08/25/2021  ? Sundowning 08/23/2021  ? Dysphagia 08/22/2021  ? Hypernatremia 08/22/2021  ? Obesity (BMI 30-39.9) 08/21/2021  ? Endotracheally intubated   ? Influenza   ? Acute respiratory failure with hypoxemia ([place]) 08/06/2021  ? Ventral hernia 12/31/2020  ? Nausea with vomiting 12/31/2019  ? Dyslipidemia, goal LDL below 70 06/18/2019  ? Hx of adenomatous colonic polyps 10/17/2018  ? Anemia 10/17/2018  ? Phimosis of penis 09/27/2018  ? Primary osteoarthritis of right hip 06/20/2018  ? Esophageal dysphagia 05/21/2018  ? GERD (gastroesophageal reflux disease) 05/21/2018  ? RLS (restless legs syndrome)   ? PUD (peptic ulcer disease)   ? Insomnia   ? Chronic diastolic CHF (congestive heart failure) ([place])   ? Asthma   ? Arteriosclerotic cardiovascular disease (ASCVD)   ? Chronic anticoagulation 01/27/2017  ? Depression 09/24/2015  ? COPD (chronic obstructive pulmonary disease) ([place]) 07/20/2015  ? Accelerating angina ([place]) 07/19/2015  ? Genetic testing 09/09/2014  ? H/O adenomatous polyp of colon 08/11/2014  ? Non-insulin treated type 2 diabetes mellitus ([place]) 02/09/2011  ? CAD S/P percutaneous coronary angioplasty   ? Tobacco abuse   ? Essential hypertension   ? Chronic atrial fibrillation ([place]) 02/23/2010  ? Obstructive sleep apnea 12/27/2009  ? ? ?REFERRING DIAG: M16.11 (ICD-10-CM) - Primary osteoarthritis of right hip R53.81 (ICD-10-CM) - Debility   ? ?THERAPY DIAG:  ?Pain in right hip ? ?Muscle weakness (generalized) ? ?Other abnormalities of gait and mobility ? ?Other symptoms and signs involving the musculoskeletal system ? ?PERTINENT HISTORY: Pneumonia 12/22, afib, COPD, CHF, DM, anxiety, depression ? ?PRECAUTIONS: Fall ? ?SUBJECTIVE: Pt stated  he had some next day soreness after last session, overall pain doesn't seem to be as bad but mornings are the hardest and cold weather isn't helping  ? ?PAIN:  ?Are you having pain? Yes: NPRS scale: 6/10 ?Pain location: lateral/posterior  ?Pain description: sharp ?Aggravating factors: movement, sleep position ?Relieving factors: recliner ? ? ? ?OBJECTIVE:  ?  ?Below in italics are FROM EVALUATION =   ? PATIENT SURVEYS:  ?FOTO 42% function ?  ?OBSERVATION:  Ambulates without AD, unsteady with wide BOS, antalgic on L  ?  ?POSTURE:  ?Frequently slouched with lateral lean in seated  ?  ?PALPATION: ?TTP R glute med/min and max ?  ?LE ROM: decreased R hip abd and extension > L  ?  ?Active ROM Right ?11/11/2021 Left ?11/11/2021  ?Hip flexion      ?Hip extension      ?Hip abduction      ?Hip adduction      ?Hip internal rotation      ?Hip external rotation      ?Knee flexion      ?  Knee extension      ?Ankle dorsiflexion      ?Ankle plantarflexion      ?Ankle inversion      ?Ankle eversion      ? (Blank rows = not tested) ?  ?LE MMT: extension not tested as patient unable to lay prone ?  ?MMT Right ?11/11/2021 Left ?11/11/2021  ?Hip flexion 4/5 4+/5  ?Hip extension      ?Hip abduction 3/5 3/5  ?Hip adduction      ?Hip internal rotation      ?Hip external rotation      ?Knee flexion 4+/5 5/5  ?Knee extension 4+/5 5/5  ?Ankle dorsiflexion 5/5 5/5  ?Ankle plantarflexion      ?Ankle inversion      ?Ankle eversion      ? (Blank rows = not tested) ?  ?  ?  ?FUNCTIONAL TESTS:  ?5 times sit to stand: 13.15 with heavy bilateral UE support ?2 minute walk test: 345 feet ?Transfers: Bilateral UE use, labored ?  ?GAIT: ?Distance walked: 345 feet ?Assistive device utilized: None ?Level of assistance: Modified independence ?Comments: 2MWT, antalgic, decreased R hip extension, slight trunk flexion for limited r hip extension, antalgic; worsening mechanics with fatigue ?  ?  ?  ?TODAY'S TREATMENT: ? 11/22/21:   ? Supine =   ?LTR 1 x 10 smooth and  slow and then 5 x 20 seconds ?Bridge with cue for anterior hip open 2 x 10  ?Supine hip adductor stretch open books 1 x 10 smooth and slow and then 5 x 20 seconds  ?Seated =  ?Hip abduction verse green

## 2021-11-23 ENCOUNTER — Other Ambulatory Visit (HOSPITAL_BASED_OUTPATIENT_CLINIC_OR_DEPARTMENT_OTHER): Payer: Self-pay

## 2021-11-25 ENCOUNTER — Ambulatory Visit (HOSPITAL_COMMUNITY): Payer: Medicare Other | Admitting: Physical Therapy

## 2021-11-25 ENCOUNTER — Other Ambulatory Visit: Payer: Self-pay | Admitting: Registered Nurse

## 2021-11-25 ENCOUNTER — Telehealth: Payer: Medicare Other

## 2021-11-25 ENCOUNTER — Other Ambulatory Visit (HOSPITAL_BASED_OUTPATIENT_CLINIC_OR_DEPARTMENT_OTHER): Payer: Self-pay

## 2021-11-25 ENCOUNTER — Encounter (HOSPITAL_COMMUNITY): Payer: Self-pay | Admitting: Physical Therapy

## 2021-11-25 DIAGNOSIS — M1611 Unilateral primary osteoarthritis, right hip: Secondary | ICD-10-CM | POA: Diagnosis not present

## 2021-11-25 DIAGNOSIS — R2689 Other abnormalities of gait and mobility: Secondary | ICD-10-CM | POA: Diagnosis not present

## 2021-11-25 DIAGNOSIS — R5381 Other malaise: Secondary | ICD-10-CM

## 2021-11-25 DIAGNOSIS — M25551 Pain in right hip: Secondary | ICD-10-CM

## 2021-11-25 DIAGNOSIS — R29898 Other symptoms and signs involving the musculoskeletal system: Secondary | ICD-10-CM

## 2021-11-25 DIAGNOSIS — M6281 Muscle weakness (generalized): Secondary | ICD-10-CM

## 2021-11-25 MED ORDER — SILDENAFIL CITRATE 100 MG PO TABS
ORAL_TABLET | ORAL | 0 refills | Status: DC
  Filled 2021-11-25: qty 8, 30d supply, fill #0

## 2021-11-25 NOTE — Patient Instructions (Signed)
Access Code: [email or web address] ?URL: [email or web address] ?Date: 11/25/2021 ?Prepared by: Mitzi Hansen Parvin Stetzer ? ?Exercises ?- Lunge with Counter Support (Mirrored)  - 1 x daily - 7 x weekly - 2 sets - 10 reps ?- Squat with Counter Support  - 1 x daily - 7 x weekly - 2 sets - 10 reps ?

## 2021-11-25 NOTE — Therapy (Signed)
?OUTPATIENT PHYSICAL THERAPY TREATMENT NOTE ? ? ?Patient Name: Shaun [name] ?MRN: [id number] ?DOB:03-24-1956, 66 y.o., male ?Today's Date: 11/25/2021 ? ?PCP: Maximiano Coss, NP ?REFERRING PROVIDER: Charlett Blake, MD ? ? PT End of Session - 11/25/21 0350   ? ? Visit Number 5   ? Number of Visits 12   ? Date for PT Re-Evaluation 12/23/21   ? Authorization Type UHC Medicare   ? Progress Note Due on Visit 10   ? PT Start Time [phone]   ? PT Stop Time 0917   ? PT Time Calculation (min) 43 min   ? Activity Tolerance Patient tolerated treatment well   ? Behavior During Therapy [facility] for tasks assessed/performed   ? ?  ?  ? ?  ? ? ? ?Past Medical History:  ?Diagnosis Date  ? Anemia   ? Anxiety   ? Asthma   ? CAD (coronary artery disease)   ? DES to distal circumflex 2016  ? Cataract   ? Colon polyps   ? 30 colon polyps found on first colonoscopy  ? Diastolic heart failure ([place])   ? Diverticulitis   ? DJD (degenerative joint disease)   ? GERD (gastroesophageal reflux disease)   ? History of kidney stones   ? Hyperlipidemia   ? Hypertension   ? Insomnia   ? Obstructive sleep apnea 12/2009  ? 01/26/2010 AHI 83/hr  ? Permanent atrial fibrillation ([place])   ? Onset 2006 paroxysmal then progressive to persistent  ? PUD (peptic ulcer disease)   ? 1980s  ? RLS (restless legs syndrome)   ? Sinusitis   ? Skin cancer   ? Type 2 diabetes mellitus ([place])   ? ?Past Surgical History:  ?Procedure Laterality Date  ? BIOPSY  07/17/2018  ? Procedure: BIOPSY;  Surgeon: Danie Binder, MD;  [facility];  Service: Endoscopy;;  colon  ? BOWEL RESECTION  09/17/2018  ? SMALL BOWEL RESECTION: 71 CM   ? CARDIAC CATHETERIZATION N/A 07/21/2015  ? Procedure: Left Heart Cath and Coronary Angiography;  Surgeon: Peter M Martinique, MD;  Location: [place] CV LAB;  Service: Cardiovascular;  Laterality: N/A;  ? CARDIAC CATHETERIZATION N/A 07/21/2015  ? Procedure: Coronary Stent Intervention;  Surgeon: Peter M Martinique, MD;  Location: [place]  CV LAB;  Service: Cardiovascular;  Laterality: N/A;  ? CIRCUMCISION N/A 04/05/2019  ? Procedure: CIRCUMCISION ADULT;  Surgeon: Irine Seal, MD;  [facility]: [facility];  Service: Urology;  Laterality: N/A;  ? COLONOSCOPY N/A 05/19/2014  ? Dr. Barnie Alderman diverticulosis/moderate external hemorrhoids, >20 simple adenomas. Genetic screening negative.   ? COLONOSCOPY WITH PROPOFOL N/A 07/17/2018  ? Dr. Oneida Alar: Diverticulosis, external/internal hemorrhoids, 32 colon polyps removed.  ten tubular adenomas removed with no high-grade dysplasia.  Advised to have surveillance colonoscopy in 3 years.  ? COLONOSCOPY WITH PROPOFOL N/A 06/07/2021  ? Procedure: COLONOSCOPY WITH PROPOFOL;  Surgeon: Eloise Harman, DO;  [facility];  Service: Endoscopy;  Laterality: N/A;  9:30 / ASA 3  (Pt was told that his time will be given at Pre-op)  ? ESOPHAGOGASTRODUODENOSCOPY (EGD) WITH PROPOFOL N/A 07/17/2018  ? Dr. Oneida Alar: Low-grade narrowing Schatzki ring at the GE junction status post dilation.  Gastritis.  Biopsy with mild nonspecific reactive gastropathy.  No H. pylori.  ? GIVENS CAPSULE STUDY N/A 06/24/2019  ? normal  ? HERNIA REPAIR  1986  ? Left inguinal  ? INTRAVASCULAR PRESSURE WIRE/FFR STUDY Left 06/08/2017  ? Procedure: INTRAVASCULAR PRESSURE WIRE/FFR STUDY;  Surgeon: Nelva Bush, MD;  Location: [place] CV LAB;  Service: Cardiovascular;  Laterality: Left;  LAD and CFX  ? LAPAROTOMY N/A 09/17/2018  ? Procedure: EXPLORATORY LAPAROTOMY;  Surgeon: Virl Cagey, MD;  [facility];  Service: General;  Laterality: N/A;  ? LEFT HEART CATH AND CORONARY ANGIOGRAPHY N/A 06/08/2017  ? Procedure: LEFT HEART CATH AND CORONARY ANGIOGRAPHY;  Surgeon: Nelva Bush, MD;  Location: [place] CV LAB;  Service: Cardiovascular;  Laterality: N/A;  ? POLYPECTOMY  07/17/2018  ? Procedure: POLYPECTOMY;  Surgeon: Danie Binder, MD;  [facility];  Service: Endoscopy;;  colon  ? POLYPECTOMY  06/07/2021  ? Procedure:  POLYPECTOMY INTESTINAL;  Surgeon: Eloise Harman, DO;  [facility];  Service: Endoscopy;;  ? ROTATOR CUFF REPAIR    ? Right  ? SAVORY DILATION N/A 07/17/2018  ? Procedure: SAVORY DILATION;  Surgeon: Danie Binder, MD;  [facility];  Service: Endoscopy;  Laterality: N/A;  ? ?Patient Active Problem List  ? Diagnosis Date Noted  ? Debility 10/21/2021  ? Acute respiratory failure ([place]) 08/25/2021  ? Sundowning 08/23/2021  ? Dysphagia 08/22/2021  ? Hypernatremia 08/22/2021  ? Obesity (BMI 30-39.9) 08/21/2021  ? Endotracheally intubated   ? Influenza   ? Acute respiratory failure with hypoxemia ([place]) 08/06/2021  ? Ventral hernia 12/31/2020  ? Nausea with vomiting 12/31/2019  ? Dyslipidemia, goal LDL below 70 06/18/2019  ? Hx of adenomatous colonic polyps 10/17/2018  ? Anemia 10/17/2018  ? Phimosis of penis 09/27/2018  ? Primary osteoarthritis of right hip 06/20/2018  ? Esophageal dysphagia 05/21/2018  ? GERD (gastroesophageal reflux disease) 05/21/2018  ? RLS (restless legs syndrome)   ? PUD (peptic ulcer disease)   ? Insomnia   ? Chronic diastolic CHF (congestive heart failure) ([place])   ? Asthma   ? Arteriosclerotic cardiovascular disease (ASCVD)   ? Chronic anticoagulation 01/27/2017  ? Depression 09/24/2015  ? COPD (chronic obstructive pulmonary disease) ([place]) 07/20/2015  ? Accelerating angina ([place]) 07/19/2015  ? Genetic testing 09/09/2014  ? H/O adenomatous polyp of colon 08/11/2014  ? Non-insulin treated type 2 diabetes mellitus ([place]) 02/09/2011  ? CAD S/P percutaneous coronary angioplasty   ? Tobacco abuse   ? Essential hypertension   ? Chronic atrial fibrillation ([place]) 02/23/2010  ? Obstructive sleep apnea 12/27/2009  ? ? ?REFERRING DIAG: M16.11 (ICD-10-CM) - Primary osteoarthritis of right hip R53.81 (ICD-10-CM) - Debility   ? ?THERAPY DIAG:  ?Pain in right hip ? ?Muscle weakness (generalized) ? ?Other abnormalities of gait and mobility ? ?Other symptoms and signs involving the  musculoskeletal system ? ?Debility ? ?PERTINENT HISTORY: Pneumonia 12/22, afib, COPD, CHF, DM, anxiety, depression ? ?PRECAUTIONS: Fall ? ?SUBJECTIVE: Increased hip pain today. Used weed eater yesterday so he thinks that may have caused it.  ? ?PAIN:  ?Are you having pain? Yes: NPRS scale: 8/10 ?Pain location: lateral/posterior  ?Pain description: sharp ?Aggravating factors: movement, sleep position ?Relieving factors: recliner ? ? ? ?OBJECTIVE:  ?  ?Below in italics are FROM EVALUATION =   ? PATIENT SURVEYS:  ?FOTO 42% function ?  ?OBSERVATION:  Ambulates without AD, unsteady with wide BOS, antalgic on L  ?  ?POSTURE:  ?Frequently slouched with lateral lean in seated  ?  ?PALPATION: ?TTP R glute med/min and max ?  ?LE ROM: decreased R hip abd and extension > L  ?  ?Active ROM Right ?11/11/2021 Left ?11/11/2021  ?Hip flexion      ?Hip  extension      ?Hip abduction      ?Hip adduction      ?Hip internal rotation      ?Hip external rotation      ?Knee flexion      ?Knee extension      ?Ankle dorsiflexion      ?Ankle plantarflexion      ?Ankle inversion      ?Ankle eversion      ? (Blank rows = not tested) ?  ?LE MMT: extension not tested as patient unable to lay prone ?  ?MMT Right ?11/11/2021 Left ?11/11/2021  ?Hip flexion 4/5 4+/5  ?Hip extension      ?Hip abduction 3/5 3/5  ?Hip adduction      ?Hip internal rotation      ?Hip external rotation      ?Knee flexion 4+/5 5/5  ?Knee extension 4+/5 5/5  ?Ankle dorsiflexion 5/5 5/5  ?Ankle plantarflexion      ?Ankle inversion      ?Ankle eversion      ? (Blank rows = not tested) ?  ?  ?  ?FUNCTIONAL TESTS:  ?5 times sit to stand: 13.15 with heavy bilateral UE support ?2 minute walk test: 345 feet ?Transfers: Bilateral UE use, labored ?  ?GAIT: ?Distance walked: 345 feet ?Assistive device utilized: None ?Level of assistance: Modified independence ?Comments: 2MWT, antalgic, decreased R hip extension, slight trunk flexion for limited r hip extension, antalgic; worsening  mechanics with fatigue ?  ?  ?  ?TODAY'S TREATMENT: ?11/25/21 ?Nustep 5 minutes for dynamic warm up and conditioning - Level 4 seat 10  ?Alternating march 2x 10 bilateral with cueing for prior glute activation on sta

## 2021-11-26 DIAGNOSIS — J439 Emphysema, unspecified: Secondary | ICD-10-CM

## 2021-11-26 DIAGNOSIS — I5032 Chronic diastolic (congestive) heart failure: Secondary | ICD-10-CM

## 2021-11-26 DIAGNOSIS — I10 Essential (primary) hypertension: Secondary | ICD-10-CM | POA: Diagnosis not present

## 2021-11-26 DIAGNOSIS — I2510 Atherosclerotic heart disease of native coronary artery without angina pectoris: Secondary | ICD-10-CM | POA: Diagnosis not present

## 2021-11-26 DIAGNOSIS — E785 Hyperlipidemia, unspecified: Secondary | ICD-10-CM

## 2021-11-26 DIAGNOSIS — E1165 Type 2 diabetes mellitus with hyperglycemia: Secondary | ICD-10-CM

## 2021-11-26 DIAGNOSIS — I200 Unstable angina: Secondary | ICD-10-CM | POA: Diagnosis not present

## 2021-11-26 DIAGNOSIS — F321 Major depressive disorder, single episode, moderate: Secondary | ICD-10-CM | POA: Diagnosis not present

## 2021-11-26 DIAGNOSIS — I482 Chronic atrial fibrillation, unspecified: Secondary | ICD-10-CM | POA: Diagnosis not present

## 2021-11-26 DIAGNOSIS — Z9861 Coronary angioplasty status: Secondary | ICD-10-CM | POA: Diagnosis not present

## 2021-11-26 DIAGNOSIS — H40013 Open angle with borderline findings, low risk, bilateral: Secondary | ICD-10-CM | POA: Diagnosis not present

## 2021-11-30 ENCOUNTER — Encounter (HOSPITAL_COMMUNITY): Payer: Self-pay

## 2021-11-30 ENCOUNTER — Ambulatory Visit (HOSPITAL_COMMUNITY): Payer: Medicare Other | Attending: Physical Medicine & Rehabilitation

## 2021-11-30 DIAGNOSIS — M6281 Muscle weakness (generalized): Secondary | ICD-10-CM | POA: Insufficient documentation

## 2021-11-30 DIAGNOSIS — M25551 Pain in right hip: Secondary | ICD-10-CM | POA: Insufficient documentation

## 2021-11-30 DIAGNOSIS — R2689 Other abnormalities of gait and mobility: Secondary | ICD-10-CM | POA: Insufficient documentation

## 2021-11-30 DIAGNOSIS — R5381 Other malaise: Secondary | ICD-10-CM | POA: Diagnosis not present

## 2021-11-30 DIAGNOSIS — R29898 Other symptoms and signs involving the musculoskeletal system: Secondary | ICD-10-CM | POA: Insufficient documentation

## 2021-11-30 NOTE — Therapy (Signed)
?OUTPATIENT PHYSICAL THERAPY TREATMENT NOTE ? ? ?Patient Name: Shaun [name] ?MRN: [id number] ?DOB:[birth year], 66 y.o., male ?Today's Date: 11/30/2021 ? ?PCP: Maximiano Coss, NP ?REFERRING PROVIDER: Charlett Blake, MD ? ? PT End of Session - 11/30/21 1213   ? ? Visit Number 6   ? Number of Visits 12   ? Date for PT Re-Evaluation 12/23/21   ? Authorization Type UHC Medicare   ? Progress Note Due on Visit 10   ? PT Start Time 1134   ? PT Stop Time 1212   ? PT Time Calculation (min) 38 min   ? Activity Tolerance Patient tolerated treatment well   ? Behavior During Therapy [facility] for tasks assessed/performed   ? ?  ?  ? ?  ? ? ? ? ?Past Medical History:  ?Diagnosis Date  ? Anemia   ? Anxiety   ? Asthma   ? CAD (coronary artery disease)   ? DES to distal circumflex 2016  ? Cataract   ? Colon polyps   ? 30 colon polyps found on first colonoscopy  ? Diastolic heart failure ([place])   ? Diverticulitis   ? DJD (degenerative joint disease)   ? GERD (gastroesophageal reflux disease)   ? History of kidney stones   ? Hyperlipidemia   ? Hypertension   ? Insomnia   ? Obstructive sleep apnea 12/2009  ? 01/26/2010 AHI 83/hr  ? Permanent atrial fibrillation ([place])   ? Onset 2006 paroxysmal then progressive to persistent  ? PUD (peptic ulcer disease)   ? 1980s  ? RLS (restless legs syndrome)   ? Sinusitis   ? Skin cancer   ? Type 2 diabetes mellitus ([place])   ? ?Past Surgical History:  ?Procedure Laterality Date  ? BIOPSY  07/17/2018  ? Procedure: BIOPSY;  Surgeon: Danie Binder, MD;  [facility];  Service: Endoscopy;;  colon  ? BOWEL RESECTION  09/17/2018  ? SMALL BOWEL RESECTION: 71 CM   ? CARDIAC CATHETERIZATION N/A 07/21/2015  ? Procedure: Left Heart Cath and Coronary Angiography;  Surgeon: Peter M Martinique, MD;  Location: [place] [facility] LAB;  Service: Cardiovascular;  Laterality: N/A;  ? CARDIAC CATHETERIZATION N/A 07/21/2015  ? Procedure: Coronary Stent Intervention;  Surgeon: Peter M Martinique, MD;  Location: [place]  CV LAB;  Service: Cardiovascular;  Laterality: N/A;  ? CIRCUMCISION N/A 04/05/2019  ? Procedure: CIRCUMCISION ADULT;  Surgeon: Irine Seal, MD;  [facility];  Service: Urology;  Laterality: N/A;  ? COLONOSCOPY N/A 05/19/2014  ? Dr. Barnie Alderman diverticulosis/moderate external hemorrhoids, >20 simple adenomas. Genetic screening negative.   ? COLONOSCOPY WITH PROPOFOL N/A 07/17/2018  ? Dr. Oneida Alar: Diverticulosis, external/internal hemorrhoids, 32 colon polyps removed.  ten tubular adenomas removed with no high-grade dysplasia.  Advised to have surveillance colonoscopy in 3 years.  ? COLONOSCOPY WITH PROPOFOL N/A 06/07/2021  ? Procedure: COLONOSCOPY WITH PROPOFOL;  Surgeon: Eloise Harman, DO;  [facility];  Service: Endoscopy;  Laterality: N/A;  9:30 / ASA 3  (Pt was told that his time will be given at Pre-op)  ? ESOPHAGOGASTRODUODENOSCOPY (EGD) WITH PROPOFOL N/A 07/17/2018  ? Dr. Oneida Alar: Low-grade narrowing Schatzki ring at the GE junction status post dilation.  Gastritis.  Biopsy with mild nonspecific reactive gastropathy.  No H. pylori.  ? GIVENS CAPSULE STUDY N/A 06/24/2019  ? normal  ? HERNIA REPAIR  1986  ? Left inguinal  ? INTRAVASCULAR PRESSURE WIRE/FFR STUDY Left 06/08/2017  ? Procedure: INTRAVASCULAR PRESSURE WIRE/FFR STUDY;  Surgeon: Nelva Bush, MD;  Location: [place] CV LAB;  Service: Cardiovascular;  Laterality: Left;  LAD and CFX  ? LAPAROTOMY N/A 09/17/2018  ? Procedure: EXPLORATORY LAPAROTOMY;  Surgeon: Virl Cagey, MD;  [facility];  Service: General;  Laterality: N/A;  ? LEFT HEART CATH AND CORONARY ANGIOGRAPHY N/A 06/08/2017  ? Procedure: LEFT HEART CATH AND CORONARY ANGIOGRAPHY;  Surgeon: Nelva Bush, MD;  Location: [place] CV LAB;  Service: Cardiovascular;  Laterality: N/A;  ? POLYPECTOMY  07/17/2018  ? Procedure: POLYPECTOMY;  Surgeon: Danie Binder, MD;  [facility];  Service: Endoscopy;;  colon  ? POLYPECTOMY  06/07/2021  ? Procedure:  POLYPECTOMY INTESTINAL;  Surgeon: Eloise Harman, DO;  [facility];  Service: Endoscopy;;  ? ROTATOR CUFF REPAIR    ? Right  ? SAVORY DILATION N/A 07/17/2018  ? Procedure: SAVORY DILATION;  Surgeon: Danie Binder, MD;  [facility];  Service: Endoscopy;  Laterality: N/A;  ? ?Patient Active Problem List  ? Diagnosis Date Noted  ? Debility 10/21/2021  ? Acute respiratory failure () 08/25/2021  ? Sundowning 08/23/2021  ? Dysphagia 08/22/2021  ? Hypernatremia 08/22/2021  ? Obesity (BMI 30-39.9) 08/21/2021  ? Endotracheally intubated   ? Influenza   ? Acute respiratory failure with hypoxemia ([place]) 08/06/2021  ? Ventral hernia 12/31/2020  ? Nausea with vomiting 12/31/2019  ? Dyslipidemia, goal LDL below 70 06/18/2019  ? Hx of adenomatous colonic polyps 10/17/2018  ? Anemia 10/17/2018  ? Phimosis of penis 09/27/2018  ? Primary osteoarthritis of right hip 06/20/2018  ? Esophageal dysphagia 05/21/2018  ? GERD (gastroesophageal reflux disease) 05/21/2018  ? RLS (restless legs syndrome)   ? PUD (peptic ulcer disease)   ? Insomnia   ? Chronic diastolic CHF (congestive heart failure) ([place])   ? Asthma   ? Arteriosclerotic cardiovascular disease (ASCVD)   ? Chronic anticoagulation 01/27/2017  ? Depression 09/24/2015  ? COPD (chronic obstructive pulmonary disease) ([place]) 07/20/2015  ? Accelerating angina ([place]) 07/19/2015  ? Genetic testing 09/09/2014  ? H/O adenomatous polyp of colon 08/11/2014  ? Non-insulin treated type 2 diabetes mellitus ([place]) 02/09/2011  ? CAD S/P percutaneous coronary angioplasty   ? Tobacco abuse   ? Essential hypertension   ? Chronic atrial fibrillation ([place]) 02/23/2010  ? Obstructive sleep apnea 12/27/2009  ? ? ?REFERRING DIAG: M16.11 (ICD-10-CM) - Primary osteoarthritis of right hip R53.81 (ICD-10-CM) - Debility   ? ?THERAPY DIAG:  ?Pain in right hip ? ?Muscle weakness (generalized) ? ?Other abnormalities of gait and mobility ? ?Other symptoms and signs involving the  musculoskeletal system ? ?PERTINENT HISTORY: Pneumonia 12/22, afib, COPD, CHF, DM, anxiety, depression ? ?PRECAUTIONS: Fall ? ?SUBJECTIVE: Pt stated he is feeling good today, no reports of pain or recent event.  Reports most difficulty with getting up from chairs. ? ?PAIN:  ?Are you having pain? Yes: NPRS scale: 8/10 ?Pain location: lateral/posterior  ?Pain description: sharp ?Aggravating factors: movement, sleep position ?Relieving factors: recliner ? ? ? ?OBJECTIVE:  ?  ?Below in italics are FROM EVALUATION =   ? PATIENT SURVEYS:  ?FOTO 42% function ?  ?OBSERVATION:  Ambulates without AD, unsteady with wide BOS, antalgic on L  ?  ?POSTURE:  ?Frequently slouched with lateral lean in seated  ?  ?PALPATION: ?TTP R glute med/min and max ?  ?LE ROM: decreased R hip abd and extension > L  ?  ?Active ROM Right ?11/11/2021 Left ?11/11/2021  ?Hip flexion      ?  Hip extension      ?Hip abduction      ?Hip adduction      ?Hip internal rotation      ?Hip external rotation      ?Knee flexion      ?Knee extension      ?Ankle dorsiflexion      ?Ankle plantarflexion      ?Ankle inversion      ?Ankle eversion      ? (Blank rows = not tested) ?  ?LE MMT: extension not tested as patient unable to lay prone ?  ?MMT Right ?11/11/2021 Left ?11/11/2021  ?Hip flexion 4/5 4+/5  ?Hip extension      ?Hip abduction 3/5 3/5  ?Hip adduction      ?Hip internal rotation      ?Hip external rotation      ?Knee flexion 4+/5 5/5  ?Knee extension 4+/5 5/5  ?Ankle dorsiflexion 5/5 5/5  ?Ankle plantarflexion      ?Ankle inversion      ?Ankle eversion      ? (Blank rows = not tested) ?  ?  ?  ?FUNCTIONAL TESTS:  ?5 times sit to stand: 13.15 with heavy bilateral UE support ?2 minute walk test: 345 feet ?Transfers: Bilateral UE use, labored ?  ?GAIT: ?Distance walked: 345 feet ?Assistive device utilized: None ?Level of assistance: Modified independence ?Comments: 2MWT, antalgic, decreased R hip extension, slight trunk flexion for limited r hip extension,  antalgic; worsening mechanics with fatigue ?  ?  ?  ?TODAY'S TREATMENT: ?11/30/21: ? 2MWT 434f no AD ? STS 20x  ? Squat ? SLS Rt 32", Lt 35" ? Tandem stnace 1x30 on floor ? Tandem stance 2x 30" on foam ? Alternat

## 2021-12-01 ENCOUNTER — Other Ambulatory Visit (HOSPITAL_BASED_OUTPATIENT_CLINIC_OR_DEPARTMENT_OTHER): Payer: Self-pay

## 2021-12-02 ENCOUNTER — Ambulatory Visit (HOSPITAL_COMMUNITY): Payer: Medicare Other | Admitting: Physical Therapy

## 2021-12-02 ENCOUNTER — Encounter (HOSPITAL_COMMUNITY): Payer: Self-pay | Admitting: Physical Therapy

## 2021-12-02 DIAGNOSIS — R5381 Other malaise: Secondary | ICD-10-CM | POA: Diagnosis not present

## 2021-12-02 DIAGNOSIS — M25551 Pain in right hip: Secondary | ICD-10-CM

## 2021-12-02 DIAGNOSIS — M6281 Muscle weakness (generalized): Secondary | ICD-10-CM

## 2021-12-02 DIAGNOSIS — R2689 Other abnormalities of gait and mobility: Secondary | ICD-10-CM | POA: Diagnosis not present

## 2021-12-02 DIAGNOSIS — R29898 Other symptoms and signs involving the musculoskeletal system: Secondary | ICD-10-CM | POA: Diagnosis not present

## 2021-12-02 NOTE — Therapy (Signed)
?OUTPATIENT PHYSICAL THERAPY TREATMENT NOTE ? ? ?Patient Name: Shaun [name] ?MRN: [id number] ?DOB:Nov 04, 1955, 66 y.o., male ?Today's Date: 12/02/2021 ? ?PCP: Maximiano Coss, NP ?REFERRING PROVIDER: Charlett Blake, MD ? ? PT End of Session - 12/02/21 0958   ? ? Visit Number 7   ? Number of Visits 12   ? Date for PT Re-Evaluation 12/23/21   ? Authorization Type UHC Medicare   ? Progress Note Due on Visit 10   ? PT Start Time 1000   ? PT Stop Time 4166   ? PT Time Calculation (min) 40 min   ? Activity Tolerance Patient tolerated treatment well   ? Behavior During Therapy [facility] for tasks assessed/performed   ? ?  ?  ? ?  ? ? ? ? ?Past Medical History:  ?Diagnosis Date  ? Anemia   ? Anxiety   ? Asthma   ? CAD (coronary artery disease)   ? DES to distal circumflex 2016  ? Cataract   ? Colon polyps   ? 30 colon polyps found on first colonoscopy  ? Diastolic heart failure ([place])   ? Diverticulitis   ? DJD (degenerative joint disease)   ? GERD (gastroesophageal reflux disease)   ? History of kidney stones   ? Hyperlipidemia   ? Hypertension   ? Insomnia   ? Obstructive sleep apnea 12/2009  ? 01/26/2010 AHI 83/hr  ? Permanent atrial fibrillation ([place])   ? Onset 2006 paroxysmal then progressive to persistent  ? PUD (peptic ulcer disease)   ? 1980s  ? RLS (restless legs syndrome)   ? Sinusitis   ? Skin cancer   ? Type 2 diabetes mellitus ([place])   ? ?Past Surgical History:  ?Procedure Laterality Date  ? BIOPSY  07/17/2018  ? Procedure: BIOPSY;  Surgeon: Danie Binder, MD;  [facility];  Service: Endoscopy;;  colon  ? BOWEL RESECTION  09/17/2018  ? SMALL BOWEL RESECTION: 71 CM   ? CARDIAC CATHETERIZATION N/A 07/21/2015  ? Procedure: Left Heart Cath and Coronary Angiography;  Surgeon: Peter M Martinique, MD;  Location: [place] CV [facility];  Service: Cardiovascular;  Laterality: N/A;  ? CARDIAC CATHETERIZATION N/A 07/21/2015  ? Procedure: Coronary Stent Intervention;  Surgeon: Peter M Martinique, MD;  Location: [place]  CV LAB;  Service: Cardiovascular;  Laterality: N/A;  ? CIRCUMCISION N/A 04/05/2019  ? Procedure: CIRCUMCISION ADULT;  Surgeon: Irine Seal, MD;  Location: [facility];  Service: Urology;  Laterality: N/A;  ? COLONOSCOPY N/A 05/19/2014  ? Dr. Barnie Alderman diverticulosis/moderate external hemorrhoids, >20 simple adenomas. Genetic screening negative.   ? COLONOSCOPY WITH PROPOFOL N/A 07/17/2018  ? Dr. Oneida Alar: Diverticulosis, external/internal hemorrhoids, 32 colon polyps removed.  ten tubular adenomas removed with no high-grade dysplasia.  Advised to have surveillance colonoscopy in 3 years.  ? COLONOSCOPY WITH PROPOFOL N/A 06/07/2021  ? Procedure: COLONOSCOPY WITH PROPOFOL;  Surgeon: Eloise Harman, DO;  [facility];  Service: Endoscopy;  Laterality: N/A;  9:30 / ASA 3  (Pt was told that his time will be given at Pre-op)  ? ESOPHAGOGASTRODUODENOSCOPY (EGD) WITH PROPOFOL N/A 07/17/2018  ? Dr. Oneida Alar: Low-grade narrowing Schatzki ring at the GE junction status post dilation.  Gastritis.  Biopsy with mild nonspecific reactive gastropathy.  No H. pylori.  ? GIVENS CAPSULE STUDY N/A 06/24/2019  ? normal  ? HERNIA REPAIR  1986  ? Left inguinal  ? INTRAVASCULAR PRESSURE WIRE/FFR STUDY Left 06/08/2017  ? Procedure: INTRAVASCULAR PRESSURE WIRE/FFR STUDY;  Surgeon: Nelva Bush, MD;  Location: [place] [facility];  Service: Cardiovascular;  Laterality: Left;  LAD and CFX  ? LAPAROTOMY N/A 09/17/2018  ? Procedure: EXPLORATORY LAPAROTOMY;  Surgeon: Virl Cagey, MD;  [facility]: [facility];  Service: General;  Laterality: N/A;  ? LEFT HEART CATH AND CORONARY ANGIOGRAPHY N/A 06/08/2017  ? Procedure: LEFT HEART CATH AND CORONARY ANGIOGRAPHY;  Surgeon: Nelva Bush, MD;  Location: [place] [facility] LAB;  Service: Cardiovascular;  Laterality: N/A;  ? POLYPECTOMY  07/17/2018  ? Procedure: POLYPECTOMY;  Surgeon: Danie Binder, MD;  [facility];  Service: Endoscopy;;  colon  ? POLYPECTOMY  06/07/2021  ? Procedure:  POLYPECTOMY INTESTINAL;  Surgeon: Eloise Harman, DO;  [facility];  Service: Endoscopy;;  ? ROTATOR CUFF REPAIR    ? Right  ? SAVORY DILATION N/A 07/17/2018  ? Procedure: SAVORY DILATION;  Surgeon: Danie Binder, MD;  [facility];  Service: Endoscopy;  Laterality: N/A;  ? ?Patient Active Problem List  ? Diagnosis Date Noted  ? Debility 10/21/2021  ? Acute respiratory failure ([place]) 08/25/2021  ? Sundowning 08/23/2021  ? Dysphagia 08/22/2021  ? Hypernatremia 08/22/2021  ? Obesity (BMI 30-39.9) 08/21/2021  ? Endotracheally intubated   ? Influenza   ? Acute respiratory failure with hypoxemia ([place]) 08/06/2021  ? Ventral hernia 12/31/2020  ? Nausea with vomiting 12/31/2019  ? Dyslipidemia, goal LDL below 70 06/18/2019  ? Hx of adenomatous colonic polyps 10/17/2018  ? Anemia 10/17/2018  ? Phimosis of penis 09/27/2018  ? Primary osteoarthritis of right hip 06/20/2018  ? Esophageal dysphagia 05/21/2018  ? GERD (gastroesophageal reflux disease) 05/21/2018  ? RLS (restless legs syndrome)   ? PUD (peptic ulcer disease)   ? Insomnia   ? Chronic diastolic CHF (congestive heart failure) ([place])   ? Asthma   ? Arteriosclerotic cardiovascular disease (ASCVD)   ? Chronic anticoagulation 01/27/2017  ? Depression 09/24/2015  ? COPD (chronic obstructive pulmonary disease) ([place]) 07/20/2015  ? Accelerating angina ([place]) 07/19/2015  ? Genetic testing 09/09/2014  ? H/O adenomatous polyp of colon 08/11/2014  ? Non-insulin treated type 2 diabetes mellitus ([place]) 02/09/2011  ? CAD S/P percutaneous coronary angioplasty   ? Tobacco abuse   ? Essential hypertension   ? Chronic atrial fibrillation ([place]) 02/23/2010  ? Obstructive sleep apnea 12/27/2009  ? ? ?REFERRING DIAG: M16.11 (ICD-10-CM) - Primary osteoarthritis of right hip R53.81 (ICD-10-CM) - Debility   ? ?THERAPY DIAG:  ?Pain in right hip ? ?Muscle weakness (generalized) ? ?Other abnormalities of gait and mobility ? ?Other symptoms and signs involving the  musculoskeletal system ? ?Debility ? ?PERTINENT HISTORY: Pneumonia 12/22, afib, COPD, CHF, DM, anxiety, depression ? ?PRECAUTIONS: Fall ? ?SUBJECTIVE: Hip is sore again from a lot of string trimming.  ? ?PAIN:  ?Are you having pain? Yes: NPRS scale: 5-6/10 ?Pain location: lateral/posterior  ?Pain description: sharp ?Aggravating factors: movement, sleep position ?Relieving factors: recliner ? ? ? ?OBJECTIVE:  ?  ?Below in italics are FROM EVALUATION =   ? PATIENT SURVEYS:  ?FOTO 42% function ?  ?OBSERVATION:  Ambulates without AD, unsteady with wide BOS, antalgic on L  ?  ?POSTURE:  ?Frequently slouched with lateral lean in seated  ?  ?PALPATION: ?TTP R glute med/min and max ?  ?LE ROM: decreased R hip abd and extension > L  ?  ?Active ROM Right ?11/11/2021 Left ?11/11/2021  ?Hip flexion      ?Hip extension      ?  Hip abduction      ?Hip adduction      ?Hip internal rotation      ?Hip external rotation      ?Knee flexion      ?Knee extension      ?Ankle dorsiflexion      ?Ankle plantarflexion      ?Ankle inversion      ?Ankle eversion      ? (Blank rows = not tested) ?  ?LE MMT: extension not tested as patient unable to lay prone ?  ?MMT Right ?11/11/2021 Left ?11/11/2021  ?Hip flexion 4/5 4+/5  ?Hip extension      ?Hip abduction 3/5 3/5  ?Hip adduction      ?Hip internal rotation      ?Hip external rotation      ?Knee flexion 4+/5 5/5  ?Knee extension 4+/5 5/5  ?Ankle dorsiflexion 5/5 5/5  ?Ankle plantarflexion      ?Ankle inversion      ?Ankle eversion      ? (Blank rows = not tested) ?  ?  ?  ?FUNCTIONAL TESTS:  ?5 times sit to stand: 13.15 with heavy bilateral UE support ?2 minute walk test: 345 feet ?Transfers: Bilateral UE use, labored ?  ?GAIT: ?Distance walked: 345 feet ?Assistive device utilized: None ?Level of assistance: Modified independence ?Comments: 2MWT, antalgic, decreased R hip extension, slight trunk flexion for limited r hip extension, antalgic; worsening mechanics with fatigue ?  ?  ?  ?TODAY'S  TREATMENT: ?12/02/21 ?Nustep 5 minutes for dynamic warm up and conditioning - Level 4 seat 10  ?Gait with SPC 226 feet ?SLS with vectors 5 x 5 second holds bilateral with unilateral HHA ?Step up 6 inch step 2x 10 b

## 2021-12-06 ENCOUNTER — Ambulatory Visit (HOSPITAL_COMMUNITY): Payer: Medicare Other

## 2021-12-06 ENCOUNTER — Other Ambulatory Visit (HOSPITAL_BASED_OUTPATIENT_CLINIC_OR_DEPARTMENT_OTHER): Payer: Self-pay

## 2021-12-06 DIAGNOSIS — R2689 Other abnormalities of gait and mobility: Secondary | ICD-10-CM | POA: Diagnosis not present

## 2021-12-06 DIAGNOSIS — R29898 Other symptoms and signs involving the musculoskeletal system: Secondary | ICD-10-CM | POA: Diagnosis not present

## 2021-12-06 DIAGNOSIS — M6281 Muscle weakness (generalized): Secondary | ICD-10-CM | POA: Diagnosis not present

## 2021-12-06 DIAGNOSIS — M25551 Pain in right hip: Secondary | ICD-10-CM

## 2021-12-06 DIAGNOSIS — R5381 Other malaise: Secondary | ICD-10-CM

## 2021-12-06 NOTE — Therapy (Signed)
OUTPATIENT PHYSICAL THERAPY TREATMENT NOTE   Patient Name: Shaun [name] MRN: [id number] DOB:01-Nov-1955, 66 y.o., male Today's Date: 12/06/2021  PCP: Janeece Agee, NP REFERRING PROVIDER: Erick Colace, MD   PT End of Session - 12/06/21 [phone]     Visit Number 8    Number of Visits 12    Date for PT Re-Evaluation 12/23/21    Authorization Type UHC Medicare    Progress Note Due on Visit 10    PT Start Time 0947    PT Stop Time 1035    PT Time Calculation (min) 48 min    Activity Tolerance Patient tolerated treatment well    Behavior During Therapy [facility] for tasks assessed/performed                Past Medical History:  Diagnosis Date   Anemia    Anxiety    Asthma    CAD (coronary artery disease)    DES to distal circumflex 2016   Cataract    Colon polyps    30 colon polyps found on first colonoscopy   Diastolic heart failure ([facility])    Diverticulitis    DJD (degenerative joint disease)    GERD (gastroesophageal reflux disease)    History of kidney stones    Hyperlipidemia    Hypertension    Insomnia    Obstructive sleep apnea 12/2009   01/26/2010 AHI 83/hr   Permanent atrial fibrillation ([facility])    Onset 2006 paroxysmal then progressive to persistent   PUD (peptic ulcer disease)    1980s   RLS (restless legs syndrome)    Sinusitis    Skin cancer    Type 2 diabetes mellitus ([facility])    Past Surgical History:  Procedure Laterality Date   BIOPSY  07/17/2018   Procedure: BIOPSY;  Surgeon: West Bali, MD;  [facility];  Service: Endoscopy;;  colon   BOWEL RESECTION  09/17/2018   SMALL BOWEL RESECTION: 71 CM    CARDIAC CATHETERIZATION N/A 07/21/2015   Procedure: Left Heart Cath and Coronary Angiography;  Surgeon: Peter M Swaziland, MD;  Location: [facility] INVASIVE CV LAB;  Service: Cardiovascular;  Laterality: N/A;   CARDIAC CATHETERIZATION N/A 07/21/2015   Procedure: Coronary Stent Intervention;  Surgeon: Peter M Swaziland, MD;  Location: [facility]  INVASIVE CV LAB;  Service: Cardiovascular;  Laterality: N/A;   CIRCUMCISION N/A 04/05/2019   Procedure: CIRCUMCISION ADULT;  Surgeon: Bjorn Pippin, MD;  Location: [facility];  Service: Urology;  Laterality: N/A;   COLONOSCOPY N/A 05/19/2014   Dr. Cyndi Bender diverticulosis/moderate external hemorrhoids, >20 simple adenomas. Genetic screening negative.    COLONOSCOPY WITH PROPOFOL N/A 07/17/2018   Dr. Darrick Penna: Diverticulosis, external/internal hemorrhoids, 32 colon polyps removed.  ten tubular adenomas removed with no high-grade dysplasia.  Advised to have surveillance colonoscopy in 3 years.   COLONOSCOPY WITH PROPOFOL N/A 06/07/2021   Procedure: COLONOSCOPY WITH PROPOFOL;  Surgeon: Lanelle Bal, DO;  [facility];  Service: Endoscopy;  Laterality: N/A;  9:30 / ASA 3  (Pt was told that his time will be given at Pre-op)   ESOPHAGOGASTRODUODENOSCOPY (EGD) WITH PROPOFOL N/A 07/17/2018   Dr. Darrick Penna: Low-grade narrowing Schatzki ring at the GE junction status post dilation.  Gastritis.  Biopsy with mild nonspecific reactive gastropathy.  No H. pylori.   GIVENS CAPSULE STUDY N/A 06/24/2019   normal   HERNIA REPAIR  1986   Left inguinal   INTRAVASCULAR PRESSURE WIRE/FFR STUDY Left 06/08/2017   Procedure: INTRAVASCULAR PRESSURE WIRE/FFR  STUDY;  Surgeon: Yvonne Kendall, MD;  [facility]: [facility];  Service: Cardiovascular;  Laterality: Left;  LAD and CFX   LAPAROTOMY N/A 09/17/2018   Procedure: EXPLORATORY LAPAROTOMY;  Surgeon: Lucretia Roers, MD;  Location: [facility];  Service: General;  Laterality: N/A;   LEFT HEART CATH AND CORONARY ANGIOGRAPHY N/A 06/08/2017   Procedure: LEFT HEART CATH AND CORONARY ANGIOGRAPHY;  Surgeon: Yvonne Kendall, MD;  Location: [facility] INV[facility] LAB;  Service: Cardiovascular;  Laterality: N/A;   POLYPECTOMY  07/17/2018   Procedure: POLYPECTOMY;  Surgeon: West Bali, MD;  [facility];  Service: Endoscopy;;  colon   POLYPECTOMY  06/07/2021    Procedure: POLYPECTOMY INTESTINAL;  Surgeon: Lanelle Bal, DO;  [facility];  Service: Endoscopy;;   ROTATOR CUFF REPAIR     Right   SAVORY DILATION N/A 07/17/2018   Procedure: SAVORY DILATION;  Surgeon: West Bali, MD;  [facility];  Service: Endoscopy;  Laterality: N/A;   Patient Active Problem List   Diagnosis Date Noted   Debility 10/21/2021   Acute respiratory failure (HCC) 08/25/2021   Sundowning 08/23/2021   Dysphagia 08/22/2021   Hypernatremia 08/22/2021   Obesity (BMI 30-39.9) 08/21/2021   Endotracheally intubated    Influenza    Acute respiratory failure with hypoxemia (HCC) 08/06/2021   Ventral hernia 12/31/2020   Nausea with vomiting 12/31/2019   Dyslipidemia, goal LDL below 70 06/18/2019   Hx of adenomatous colonic polyps 10/17/2018   Anemia 10/17/2018   Phimosis of penis 09/27/2018   Primary osteoarthritis of right hip 06/20/2018   Esophageal dysphagia 05/21/2018   GERD (gastroesophageal reflux disease) 05/21/2018   RLS (restless legs syndrome)    PUD (peptic ulcer disease)    Insomnia    Chronic diastolic CHF (congestive heart failure) (HCC)    Asthma    Arteriosclerotic cardiovascular disease (ASCVD)    Chronic anticoagulation 01/27/2017   Depression 09/24/2015   COPD (chronic obstructive pulmonary disease) (HCC) 07/20/2015   Accelerating angina (HCC) 07/19/2015   Genetic testing 09/09/2014   H/O adenomatous polyp of colon 08/11/2014   Non-insulin treated type 2 diabetes mellitus (HCC) 02/09/2011   CAD S/P percutaneous coronary angioplasty    Tobacco abuse    Essential hypertension    Chronic atrial fibrillation (H[facility]) 02/23/2010   Obstructive sleep apnea 12/27/2009    REFERRING DIAG: M16.11 (ICD-10-CM) - Primary osteoarthritis of right hip R53.81 (ICD-10-CM) - Debility    THERAPY DIAG:  Pain in right hip  Debility  Muscle weakness (generalized)  Other abnormalities of gait and mobility  Other symptoms and signs  involving the musculoskeletal system  PERTINENT HISTORY: Pneumonia 12/22, afib, COPD, CHF, DM, anxiety, depression  PRECAUTIONS: Fall  SUBJECTIVE: Hip is still sore but that's pretty normal for him  PAIN:  Are you having pain? Yes: NPRS scale: 4-5/10 Pain location: lateral/posterior  Pain description: aching Aggravating factors: movement, sleep position Relieving factors: recliner    OBJECTIVE:    Below in italics are FROM EVALUATION =    PATIENT SURVEYS:  FOTO 42% function   OBSERVATION:  Ambulates without AD, unsteady with wide BOS, antalgic on L    POSTURE:  Frequently slouched with lateral lean in seated    PALPATION: TTP R glute med/min and max   LE ROM: decreased R hip abd and extension > L    Active ROM Right 11/11/2021 Left 11/11/2021  Hip flexion      Hip extension  Hip abduction      Hip adduction      Hip internal rotation      Hip external rotation      Knee flexion      Knee extension      Ankle dorsiflexion      Ankle plantarflexion      Ankle inversion      Ankle eversion       (Blank rows = not tested)   LE MMT: extension not tested as patient unable to lay prone   MMT Right 11/11/2021 Left 11/11/2021  Hip flexion 4/5 4+/5  Hip extension      Hip abduction 3/5 3/5  Hip adduction      Hip internal rotation      Hip external rotation      Knee flexion 4+/5 5/5  Knee extension 4+/5 5/5  Ankle dorsiflexion 5/5 5/5  Ankle plantarflexion      Ankle inversion      Ankle eversion       (Blank rows = not tested)       FUNCTIONAL TESTS:  5 times sit to stand: 13.15 with heavy bilateral UE support 2 minute walk test: 345 feet Transfers: Bilateral UE use, labored   GAIT: Distance walked: 345 feet Assistive device utilized: None Level of assistance: Modified independence Comments: , antalgic, decreased R hip extension, slight trunk flexion for limited r hip extension, antalgic; worsening mechanics with fatigue       TODAY'S  TREATMENT: 12/06/21 Nustep 5 minutes for dynamic warm up and conditioning - Level 4 seat 10  SLS with vectors 5 x 5 second holds bilateral with unilateral HHA Step up 8 inch step 2x 10 bilateral with unilateral UE support Lateral step up 8 inch step 2 x 10 bilateral  Standing hip abduction with 3# at knees 2x 10 bilateral  Standing hip extension with 3# at knees 2x 10 bilateral  STS 2x10 Heel/toe walking blue line down and back x 2 Sidestepping blue line down and back x 2 Standing Lumbar extension x 10  12/02/21 Nustep 5 minutes for dynamic warm up and conditioning - Level 4 seat 10  Gait with SPC 226 feet SLS with vectors 5 x 5 second holds bilateral with unilateral HHA Step up 6 inch step 2x 10 bilateral with unilateral UE support Lateral step up 6 inch step 2x 10 bilateral  Standing hip abduction with green band at knees 2x 10 bilateral  STS 2x10   11/30/21:  422ft no AD  STS 20x   Squat  SLS Rt 32", Lt 35"  Tandem stnace 1x30 on floor  Tandem stance 2x 30" on foam  Alternating toe tapping 8in step  Vector stance 3x 5" Standing hip abduction 2x 10 bilateral  Standing hip extension 2x 10 bilateral  Lunge 2x 10 bilateral minimal HHA  11/25/21 Nustep 5 minutes for dynamic warm up and conditioning - Level 4 seat 10  Alternating march 2x 10 bilateral with cueing for prior glute activation on stance  Standing hip abduction 2x 10 bilateral  Standing hip extension 2x 10 bilateral  Squat 2x 10  Tandem stance 2x 30 seconds bilateral STS 2x 10  Lunge 2x 10 bilateral        PATIENT EDUCATION:  Education details: HEP, use of cane to improve gait mechanics Person educated: Patient Education method: Explanation, Demonstration Education comprehension: verbalized understanding and returned demonstration     HOME EXERCISE PROGRAM: Access Code: [email or web address]   Clamshell - 2  x daily - 7 x weekly - 2 sets - 10 reps Supine Bridge - 2 x daily - 7 x weekly - 2 sets - 10 reps Supine  Lower Trunk Rotation - 2 x daily - 7 x weekly - 2 sets - 10 reps Standing Hip Abduction - 2 x daily - 7 x weekly - 2 sets - 10 reps 3/22: tandem stance, 3D hip excursion, heel/toe raises 3/30 Squat, lunge      ASSESSMENT:   CLINICAL IMPRESSION: Today's session focused on continued hip strengthening and mobility. He has noticeable limp coming into therapy but much improved gait pattern after treatment noted.  Added 3# to hip ABD and added hip ext and lumbar extension to treatment today; increased step height to 8" for all step ups. Patient will continue to benefit from physical therapy in order to improve function and reduce impairment.    OBJECTIVE IMPAIRMENTS Abnormal gait, decreased activity tolerance, decreased balance, decreased endurance, decreased knowledge of use of DME, decreased mobility, difficulty walking, decreased ROM, decreased strength, impaired flexibility, improper body mechanics, and pain.    ACTIVITY LIMITATIONS cleaning, community activity, meal prep, yard work, shopping, and yard work.    PERSONAL FACTORS Fitness, Time since onset of injury/illness/exacerbation, and 3+ comorbidities: Pneumonia 12/22, afib, COPD, CHF, DM, anxiety, depression  are also affecting patient's functional outcome.      REHAB POTENTIAL: Good   CLINICAL DECISION MAKING: Stable/uncomplicated   EVALUATION COMPLEXITY: Low     GOALS: Goals reviewed with patient? Yes   SHORT TERM GOALS: Target date:  12/02/21   Patient will be independent with HEP in order to improve functional outcomes. Baseline:  Goal status: ONGOING   2.  Patient will report at least 25% improvement in symptoms for improved quality of life. Baseline:  Goal status: ONGOING       LONG TERM GOALS: Target date: 12/23/21   Patient will report at least 75% improvement in symptoms for improved quality of life. Baseline:  Goal status: ONGOING   2.  Patient will be able to complete 5x STS in under 11.4 seconds without UE  support in order to reduce the risk of falls. Baseline:  Goal status:ONGOING   3.  Patient will be able to ambulate at least 400 feet in in order to demonstrate improved gait speed for community ambulation.  Baseline:  Goal status: ONGOING   4.  Patient will demonstrate grade of 5/5 MMT grade in all tested musculature as evidence of improved strength to assist with stair ambulation and gait.   Baseline:  Goal status: ONGOING   5.  Patient will report ability to ambulate for  at least 20 minutes with least restrictive assistive device with no greater than 3/10 pain. Baseline:  Goal status: ONGOING         PLAN: PT FREQUENCY: 2x/week   PT DURATION: 6 weeks   PLANNED INTERVENTIONS: Therapeutic exercises, Therapeutic activity, Neuromuscular re-education, Balance training, Gait training, Patient/Family education, Joint manipulation, Joint mobilization, Stair training, Orthotic/Fit training, DME instructions, Aquatic Therapy, Dry Needling, Electrical stimulation, Spinal manipulation, Spinal mobilization, Cryotherapy, Moist heat, Compression bandaging, scar mobilization, Splintting, Taping, Traction, Ultrasound, Ionotophoresis 4mg /ml Dexamethasone, and Manual therapy     PLAN FOR NEXT SESSION: Continue glute and begin functional strengthening as able, Add more gait, standing and balance training, progress as tolerated. Try loading his squats  10:42 AM, 12/06/21 Yarelin Reichardt Small Ruther Ephraim MPT [place] [facility] [place] [phone]

## 2021-12-07 ENCOUNTER — Encounter: Payer: Medicare Other | Attending: Physical Medicine & Rehabilitation | Admitting: Physical Medicine & Rehabilitation

## 2021-12-07 ENCOUNTER — Encounter: Payer: Self-pay | Admitting: Physical Medicine & Rehabilitation

## 2021-12-07 VITALS — BP 110/79 | HR 78 | Temp 98.1°F | Ht 67.0 in | Wt 220.0 lb

## 2021-12-07 DIAGNOSIS — M1611 Unilateral primary osteoarthritis, right hip: Secondary | ICD-10-CM | POA: Diagnosis not present

## 2021-12-07 NOTE — Progress Notes (Signed)
?  PROCEDURE RECORD ?[facility] ? ? ?Name: Shaun [name] ?DOB:[birth year] ?MRN: [id number] ? ?Date:12/07/2021  Physician: Alysia Penna, MD   ? ?Nurse/CMA: Jorja Loa MA ? ?Allergies: No Known Allergies ? ?Consent Signed: Yes.    Is patient diabetic? Yes.    CBG today? 125 ? ?Pregnant: No. LMP: No LMP for male patient. (age [age]) ? ?Anticoagulants: yes (Eliquis) ?Anti-inflammatory: no ?Antibiotics: no ? ?Procedure: RIght hip intra-articular injection   Position: Supine ?Start Time: 1:21 pm  End Time: 1:22 pm  Fluoro Time: 31 ? ?RN/CMA Truman Hayward, CMA Jerline Pain MA    ?Time 1:00 pm 1:27 pm    ?BP 110/79 126/82    ?Pulse 78 89    ?Respirations 16 16    ?O2 Sat 95 98    ?S/S 6 6    ?Pain Level 5/10 2/10    ? ?D/C home with no one, patient A & O X 3, D/C instructions reviewed, and sits independently. ? ? ? ? ? ? ? ?

## 2021-12-07 NOTE — Progress Notes (Signed)
RIght hip intra-articular injection under fluoro guidance ? ?Indication osteoarthritis unresponsive to conservative care including exercise and oral medications ? ?Informed consent was obtained after describing risks and benefits of the procedure, this includes bleeding bruising and infection. The patient elected to proceed and has given written consent ? ?Placed supine on fluoroscopy table.  Betadine prep to groin area.  Imaging to identify the intertrochanteric line at the base of the neck of the femur. ?5 cc of 1% lidocaine were infiltrated into the skin and subcu tissue using 25-gauge 1.5 inch needle.  Then a 22-gauge 5" needle was inserted under fluoroscopic guidance targeting the junction of the  Right  femoral head and femoral neck area.  Bone contact was made.  Isovue 200 times a total of 3 cc were injected needle was adjusted to achieve intra-articular location. ?Then a solution containing 1 cc of 6 mg/cc Celestone and 4 cc of 1% lidocaine were injected.  Patient tolerated procedure well post procedure instructions given  ?

## 2021-12-09 ENCOUNTER — Ambulatory Visit (INDEPENDENT_AMBULATORY_CARE_PROVIDER_SITE_OTHER): Payer: Medicare Other | Admitting: *Deleted

## 2021-12-09 ENCOUNTER — Encounter (HOSPITAL_COMMUNITY): Payer: Self-pay | Admitting: Physical Therapy

## 2021-12-09 ENCOUNTER — Ambulatory Visit (HOSPITAL_COMMUNITY): Payer: Medicare Other | Admitting: Physical Therapy

## 2021-12-09 DIAGNOSIS — R2689 Other abnormalities of gait and mobility: Secondary | ICD-10-CM

## 2021-12-09 DIAGNOSIS — R5381 Other malaise: Secondary | ICD-10-CM | POA: Diagnosis not present

## 2021-12-09 DIAGNOSIS — R29898 Other symptoms and signs involving the musculoskeletal system: Secondary | ICD-10-CM

## 2021-12-09 DIAGNOSIS — M25551 Pain in right hip: Secondary | ICD-10-CM

## 2021-12-09 DIAGNOSIS — J439 Emphysema, unspecified: Secondary | ICD-10-CM

## 2021-12-09 DIAGNOSIS — Z720 Tobacco use: Secondary | ICD-10-CM

## 2021-12-09 DIAGNOSIS — I5032 Chronic diastolic (congestive) heart failure: Secondary | ICD-10-CM

## 2021-12-09 DIAGNOSIS — R270 Ataxia, unspecified: Secondary | ICD-10-CM

## 2021-12-09 DIAGNOSIS — M6281 Muscle weakness (generalized): Secondary | ICD-10-CM | POA: Diagnosis not present

## 2021-12-09 DIAGNOSIS — E1165 Type 2 diabetes mellitus with hyperglycemia: Secondary | ICD-10-CM

## 2021-12-09 DIAGNOSIS — E785 Hyperlipidemia, unspecified: Secondary | ICD-10-CM

## 2021-12-09 DIAGNOSIS — I10 Essential (primary) hypertension: Secondary | ICD-10-CM

## 2021-12-09 DIAGNOSIS — F321 Major depressive disorder, single episode, moderate: Secondary | ICD-10-CM

## 2021-12-09 NOTE — Therapy (Addendum)
?OUTPATIENT PHYSICAL THERAPY TREATMENT NOTE ? ? ?Patient Name: Shaun [name] ?MRN: [id number] ?DOB:1955-12-31, 66 y.o., male ?Today's Date: 12/09/2021 ? ?PCP: Maximiano Coss, NP ?REFERRING PROVIDER: Charlett Blake, MD ? ? PT End of Session - 12/09/21 1050   ? ? Visit Number 9   ? Number of Visits 12   ? Date for PT Re-Evaluation 12/23/21   ? Authorization Type [employer] Medicare   ? Progress Note Due on Visit 10   ? PT Start Time 1050   ? PT Stop Time 1130   ? PT Time Calculation (min) 40 min   ? Activity Tolerance Patient tolerated treatment well   ? Behavior During Therapy [facility] for tasks assessed/performed   ? ?  ?  ? ?  ? ? ? ?Past Medical History:  ?Diagnosis Date  ? Anemia   ? Anxiety   ? Asthma   ? CAD (coronary artery disease)   ? DES to distal circumflex 2016  ? Cataract   ? Colon polyps   ? 30 colon polyps found on first colonoscopy  ? Diastolic heart failure ([place])   ? Diverticulitis   ? DJD (degenerative joint disease)   ? GERD (gastroesophageal reflux disease)   ? History of kidney stones   ? Hyperlipidemia   ? Hypertension   ? Insomnia   ? Obstructive sleep apnea 12/2009  ? 01/26/2010 AHI 83/hr  ? Permanent atrial fibrillation ([place])   ? Onset 2006 paroxysmal then progressive to persistent  ? PUD (peptic ulcer disease)   ? 1980s  ? RLS (restless legs syndrome)   ? Sinusitis   ? Skin cancer   ? Type 2 diabetes mellitus ([place])   ? ?Past Surgical History:  ?Procedure Laterality Date  ? BIOPSY  07/17/2018  ? Procedure: BIOPSY;  Surgeon: Danie Binder, MD;  [facility];  Service: Endoscopy;;  colon  ? BOWEL RESECTION  09/17/2018  ? SMALL BOWEL RESECTION: 71 CM   ? CARDIAC CATHETERIZATION N/A 07/21/2015  ? Procedure: Left Heart Cath and Coronary Angiography;  Surgeon: Peter M Martinique, MD;  Location: [place] [facility];  Service: Cardiovascular;  Laterality: N/A;  ? CARDIAC CATHETERIZATION N/A 07/21/2015  ? Procedure: Coronary Stent Intervention;  Surgeon: Peter M Martinique, MD;  Location: [place]  CV LAB;  Service: Cardiovascular;  Laterality: N/A;  ? CIRCUMCISION N/A 04/05/2019  ? Procedure: CIRCUMCISION ADULT;  Surgeon: Irine Seal, MD;  Location: [facility];  Service: Urology;  Laterality: N/A;  ? COLONOSCOPY N/A 05/19/2014  ? Dr. Barnie Alderman diverticulosis/moderate external hemorrhoids, >20 simple adenomas. Genetic screening negative.   ? COLONOSCOPY WITH PROPOFOL N/A 07/17/2018  ? Dr. Oneida Alar: Diverticulosis, external/internal hemorrhoids, 32 colon polyps removed.  ten tubular adenomas removed with no high-grade dysplasia.  Advised to have surveillance colonoscopy in 3 years.  ? COLONOSCOPY WITH PROPOFOL N/A 06/07/2021  ? Procedure: COLONOSCOPY WITH PROPOFOL;  Surgeon: Eloise Harman, DO;  [facility];  Service: Endoscopy;  Laterality: N/A;  9:30 / ASA 3  (Pt was told that his time will be given at Pre-op)  ? ESOPHAGOGASTRODUODENOSCOPY (EGD) WITH PROPOFOL N/A 07/17/2018  ? Dr. Oneida Alar: Low-grade narrowing Schatzki ring at the GE junction status post dilation.  Gastritis.  Biopsy with mild nonspecific reactive gastropathy.  No H. pylori.  ? GIVENS CAPSULE STUDY N/A 06/24/2019  ? normal  ? HERNIA REPAIR  1986  ? Left inguinal  ? INTRAVASCULAR PRESSURE WIRE/FFR STUDY Left 06/08/2017  ? Procedure: INTRAVASCULAR PRESSURE WIRE/FFR STUDY;  Surgeon: Nelva Bush, MD;  Location: [place] [facility];  Service: Cardiovascular;  Laterality: Left;  LAD and CFX  ? LAPAROTOMY N/A 09/17/2018  ? Procedure: EXPLORATORY LAPAROTOMY;  Surgeon: Virl Cagey, MD;  Location: [facility];  Service: General;  Laterality: N/A;  ? LEFT HEART CATH AND CORONARY ANGIOGRAPHY N/A 06/08/2017  ? Procedure: LEFT HEART CATH AND CORONARY ANGIOGRAPHY;  Surgeon: Nelva Bush, MD;  Location: [place] [facility];  Service: Cardiovascular;  Laterality: N/A;  ? POLYPECTOMY  07/17/2018  ? Procedure: POLYPECTOMY;  Surgeon: Danie Binder, MD;  [facility];  Service: Endoscopy;;  colon  ? POLYPECTOMY  06/07/2021  ? Procedure:  POLYPECTOMY INTESTINAL;  Surgeon: Eloise Harman, DO;  [facility];  Service: Endoscopy;;  ? ROTATOR CUFF REPAIR    ? Right  ? SAVORY DILATION N/A 07/17/2018  ? Procedure: SAVORY DILATION;  Surgeon: Danie Binder, MD;  [facility];  Service: Endoscopy;  Laterality: N/A;  ? ?Patient Active Problem List  ? Diagnosis Date Noted  ? Debility 10/21/2021  ? Acute respiratory failure ([place]) 08/25/2021  ? Sundowning 08/23/2021  ? Dysphagia 08/22/2021  ? Hypernatremia 08/22/2021  ? Obesity (BMI 30-39.9) 08/21/2021  ? Endotracheally intubated   ? Influenza   ? Acute respiratory failure with hypoxemia ([place]) 08/06/2021  ? Ventral hernia 12/31/2020  ? Nausea with vomiting 12/31/2019  ? Dyslipidemia, goal LDL below 70 06/18/2019  ? Hx of adenomatous colonic polyps 10/17/2018  ? Anemia 10/17/2018  ? Phimosis of penis 09/27/2018  ? Primary osteoarthritis of right hip 06/20/2018  ? Esophageal dysphagia 05/21/2018  ? GERD (gastroesophageal reflux disease) 05/21/2018  ? RLS (restless legs syndrome)   ? PUD (peptic ulcer disease)   ? Insomnia   ? Chronic diastolic CHF (congestive heart failure) ([place])   ? Asthma   ? Arteriosclerotic cardiovascular disease (ASCVD)   ? Chronic anticoagulation 01/27/2017  ? Depression 09/24/2015  ? COPD (chronic obstructive pulmonary disease) ([place]) 07/20/2015  ? Accelerating angina ([place]) 07/19/2015  ? Genetic testing 09/09/2014  ? H/O adenomatous polyp of colon 08/11/2014  ? Non-insulin treated type 2 diabetes mellitus ([place]) 02/09/2011  ? CAD S/P percutaneous coronary angioplasty   ? Tobacco abuse   ? Essential hypertension   ? Chronic atrial fibrillation ([place]) 02/23/2010  ? Obstructive sleep apnea 12/27/2009  ? ? ?REFERRING DIAG: M16.11 (ICD-10-CM) - Primary osteoarthritis of right hip R53.81 (ICD-10-CM) - Debility   ? ?THERAPY DIAG:  ?Pain in right hip ? ?Debility ? ?Muscle weakness (generalized) ? ?Other abnormalities of gait and mobility ? ?Other symptoms and signs involving  the musculoskeletal system ? ?PERTINENT HISTORY: Pneumonia 12/22, afib, COPD, CHF, DM, anxiety, depression ? ?PRECAUTIONS: Fall ? ?SUBJECTIVE: Hip has been feeling better since injection. Exercises have been going well.  ? ?PAIN:  ?Are you having pain? Yes: NPRS scale: 2/10 ?Pain location: lateral/posterior  ?Pain description: aching ?Aggravating factors: movement, sleep position ?Relieving factors: recliner ? ? ? ?OBJECTIVE:  ?  ?Below in italics are FROM EVALUATION =   ? PATIENT SURVEYS:  ?FOTO 42% function ?  ?OBSERVATION:  Ambulates without AD, unsteady with wide BOS, antalgic on L  ?  ?POSTURE:  ?Frequently slouched with lateral lean in seated  ?  ?PALPATION: ?TTP R glute med/min and max ?  ?LE ROM: decreased R hip abd and extension > L  ?  ?Active ROM Right ?11/11/2021 Left ?11/11/2021  ?Hip flexion      ?Hip extension      ?  Hip abduction      ?Hip adduction      ?Hip internal rotation      ?Hip external rotation      ?Knee flexion      ?Knee extension      ?Ankle dorsiflexion      ?Ankle plantarflexion      ?Ankle inversion      ?Ankle eversion      ? (Blank rows = not tested) ?  ?LE MMT: extension not tested as patient unable to lay prone ?  ?MMT Right ?11/11/2021 Left ?11/11/2021  ?Hip flexion 4/5 4+/5  ?Hip extension      ?Hip abduction 3/5 3/5  ?Hip adduction      ?Hip internal rotation      ?Hip external rotation      ?Knee flexion 4+/5 5/5  ?Knee extension 4+/5 5/5  ?Ankle dorsiflexion 5/5 5/5  ?Ankle plantarflexion      ?Ankle inversion      ?Ankle eversion      ? (Blank rows = not tested) ?  ?  ?  ?FUNCTIONAL TESTS:  ?5 times sit to stand: 13.15 with heavy bilateral UE support ?2 minute walk test: 345 feet ?Transfers: Bilateral UE use, labored ?  ?GAIT: ?Distance walked: 345 feet ?Assistive device utilized: None ?Level of assistance: Modified independence ?Comments: 2MWT, antalgic, decreased R hip extension, slight trunk flexion for limited r hip extension, antalgic; worsening mechanics with fatigue ?  ?   ?  ?TODAY'S TREATMENT: ?12/09/21 ?Nustep 5 minutes for dynamic warm up and conditioning - Level 4 seat 10  ?Standing hip flexor stretch on step 3 x 20 second holds RLE ?Lateral step up 8 inch step 2x

## 2021-12-10 NOTE — Chronic Care Management (AMB) (Signed)
?Chronic Care Management  ? ? Clinical Social Work Note ? ?12/10/2021 ?Name: Shaun [name] MRN: [id number] DOB: Mar 30, 1956 ? ?Shaun [name] is a 66 y.o. year old male who is a primary care patient of Maximiano Coss, NP. The CCM team was consulted to assist the patient with chronic disease management and/or care coordination needs related to: [employer] and [employer].  ? ?Engaged with patient by telephone for follow up visit in response to provider referral for social work chronic care management and care coordination services.  ? ?Consent to Services:  ?The patient was given information about Chronic Care Management services, agreed to services, and gave verbal consent prior to initiation of services.  Please see initial visit note for detailed documentation.  ? ?Patient agreed to services and consent obtained.  ? ?Assessment: Review of patient past medical history, allergies, medications, and health status, including review of relevant consultants reports was performed today as part of a comprehensive evaluation and provision of chronic care management and care coordination services.    ? ?SDOH (Social Determinants of Health) assessments and interventions performed:   ? ?Advanced Directives Status: Not addressed in this encounter. ? ?CCM Care Plan ? ?No Known Allergies ? ?Outpatient Encounter Medications as of 12/09/2021  ?Medication Sig Note  ? acetaminophen (TYLENOL) 325 MG tablet Take 2 tablets (650 mg total) by mouth every 6 (six) hours as needed for mild pain.   ? albuterol (VENTOLIN HFA) 108 (90 Base) MCG/ACT inhaler INHALE 2 PUFFS INTO THE LUNGS EVERY 6 HOURS AS NEEDED FOR WHEEZING OR SHORTNESS OF BREATH   ? amoxicillin-clavulanate (AUGMENTIN) 875-125 MG tablet Take 1 tablet by mouth 2 (two) times daily.   ? apixaban (ELIQUIS) 5 MG TABS tablet TAKE 1 TABLET (5 MG) BY MOUTH TWICE DAILY   ? atorvastatin (LIPITOR) 80 MG tablet Take 1 tablet (80 mg total) by mouth  daily. TAKE 1 TABLET(80 MG) BY MOUTH EVERY EVENING   ? azelastine (ASTELIN) 0.1 % nasal spray Place 1 spray into both nostrils 2 (two) times daily. Use in each nostril as directed   ? buPROPion (WELLBUTRIN XL) 150 MG 24 hr tablet Take 3 tablets once daily   ? docusate sodium (COLACE) 100 MG capsule Take 100 mg by mouth daily as needed for mild constipation.   ? escitalopram (LEXAPRO) 20 MG tablet TAKE 1 TABLET(20 MG) BY MOUTH DAILY   ? ezetimibe (ZETIA) 10 MG tablet TAKE 1 TABLET BY MOUTH EVERY DAY   ? ferrous sulfate 325 (65 FE) MG tablet Take 325 mg by mouth daily with breakfast.   ? furosemide (LASIX) 40 MG tablet Take 1 tablet (40 mg total) by mouth daily.   ? magnesium gluconate (MAGONATE) 500 MG tablet Take 0.5 tablets (250 mg total) by mouth at bedtime.   ? metFORMIN (GLUCOPHAGE) 500 MG tablet TAKE 1 TABLET (500 MG) BY MOUTH TWICE DAILY WITH A MEAL.   ? Metoprolol Tartrate 75 MG TABS Take 75 mg by mouth 2 (two) times daily.   ? nitroGLYCERIN (NITROSTAT) 0.4 MG SL tablet Place 1 tablet (0.4 mg total) under the tongue every 5 (five) minutes as needed.   ? omeprazole (PRILOSEC) 40 MG capsule Take 1 capsule by mouth once daily 30 minutes before breakfast   ? potassium chloride SA (KLOR-CON M) 20 MEQ tablet Take 1 tablet (20 mEq total) by mouth daily.   ? predniSONE (DELTASONE) 20 MG tablet Take 1 tablet (20 mg total) by mouth daily with breakfast.   ?  QUEtiapine (SEROQUEL) 50 MG tablet Take 1 tablet (50 mg total) by mouth at bedtime.   ? rOPINIRole (REQUIP) 3 MG tablet TAKE 1 TABLET BY MOUTH EVERY NIGHT AT BEDTIME   ? sildenafil (VIAGRA) 100 MG tablet TAKE 1 TABLET BY MOUTH 30 MINUTES BEFORE ACTIVITY   ? sucralfate (CARAFATE) 1 g tablet Take one tablet po BID PRN (Patient taking differently: Take 1 g by mouth 2 (two) times daily as needed (acid reflux).)   ? traMADol (ULTRAM) 50 MG tablet TAKE 1 TABLET BY MOUTH TWICE DAILY AS NEEDED FOR MODERATE PAIN. 12/07/2021: Prescribed by Maximiano Coss, NP  ? ?No  facility-administered encounter medications on file as of 12/09/2021.  ? ? ?Patient Active Problem List  ? Diagnosis Date Noted  ? Debility 10/21/2021  ? Acute respiratory failure ([place]) 08/25/2021  ? Sundowning 08/23/2021  ? Dysphagia 08/22/2021  ? Hypernatremia 08/22/2021  ? Obesity (BMI 30-39.9) 08/21/2021  ? Endotracheally intubated   ? Influenza   ? Acute respiratory failure with hypoxemia ([place]) 08/06/2021  ? Ventral hernia 12/31/2020  ? Nausea with vomiting 12/31/2019  ? Dyslipidemia, goal LDL below 70 06/18/2019  ? Hx of adenomatous colonic polyps 10/17/2018  ? Anemia 10/17/2018  ? Phimosis of penis 09/27/2018  ? Primary osteoarthritis of right hip 06/20/2018  ? Esophageal dysphagia 05/21/2018  ? GERD (gastroesophageal reflux disease) 05/21/2018  ? RLS (restless legs syndrome)   ? PUD (peptic ulcer disease)   ? Insomnia   ? Chronic diastolic CHF (congestive heart failure) ([place])   ? Asthma   ? Arteriosclerotic cardiovascular disease (ASCVD)   ? Chronic anticoagulation 01/27/2017  ? Depression 09/24/2015  ? COPD (chronic obstructive pulmonary disease) ([place]) 07/20/2015  ? Accelerating angina ([place]) 07/19/2015  ? Genetic testing 09/09/2014  ? H/O adenomatous polyp of colon 08/11/2014  ? Non-insulin treated type 2 diabetes mellitus ([place]) 02/09/2011  ? CAD S/P percutaneous coronary angioplasty   ? Tobacco abuse   ? Essential hypertension   ? Chronic atrial fibrillation ([place]) 02/23/2010  ? Obstructive sleep apnea 12/27/2009  ? ? ?Conditions to be addressed/monitored: Depression.  Level of Care Concerns, ADL/IADL Limitations, Mental Health Concerns, Social Isolation, Limited Access to Caregiver, and Lacks Knowledge of [employer]. ? ?Care Plan : LCSW Plan of Care  ?Updates made by Francis Gaines, LCSW since 12/10/2021 12:00 AM  ?  ? ?Problem: Reduce and Manage My Symptoms of Depression.   ?Priority: High  ?  ? ?Goal: Reduce and Manage My Symptoms of Depression.   ?Start Date: 11/04/2021  ?Expected End Date:  02/04/2022  ?This Visit's Progress: On track  ?Recent Progress: On track  ?Priority: High  ?Note:   ?Current Barriers:   ?Acute Mental Health needs related to DM2, CHF, CAD, Atrial Fib, HTN, HLD, COPD, Depression, Obesity, Debility, Sundowning, Tobacco Abuse, and Chronic Anticoagulation, requires Support, Education, Resources, Referrals, Advocacy, and Care Coordination, in order to meet unmet Acute Mental Health needs. ?Clinical Goal(s):  ?Patient will work with LCSW, to reduce and manage symptoms of Depression, until well-controlled.     ?Patient will increase knowledge and/or ability of:  ?      Coping Skills, Healthy Habits, Self-Management Skills, Stress Reduction, Home Safety, and Utilizing [employer] and Resources.   ?Interventions: ?Collaboration with Primary Care Provider, Maximiano Coss regarding development and update of comprehensive plan of care, as evidenced by provider attestation and co-signature. ?Inter-disciplinary care team collaboration (see longitudinal plan of care). ?Clinical Interventions:  ?Mindfulness Meditation Strategies, Relaxation Techniques and Deep Breathing Exercises  reinforced daily. ?Verbalization of Feelings encouraged. ?Emotional Support provided. ?Client-Centered Therapy initiated. ?Cognitive Behavioral Therapy integrated. ?Support Group Participation encouraged. ?Increase Level of Activity/Exercise emphasized.  ?Patient Goals/Self-Care Activities: ?Continue to receive personal counseling with LCSW, on a bi-weekly basis, in an effort to reduce and manage symptoms of Depression, until well-controlled.   ?Continue to incorporate into daily practice - relaxation techniques, deep breathing exercises, and mindfulness meditation strategies. ?Thorough review of EMMI Educational Material on "When You Have Depression and Other Health Problems" and "Getting the Care You Need", to ensure understanding. ?Continue to receive twice weekly outpatient physical therapy with Margie Billet, Physical Therapist at [facility], to improve strengthening, functioning, mobility, safety, balance, and ambulation techniques. ?Contact LCSW directly (# [phone]), if you have questions, need assistan

## 2021-12-10 NOTE — Patient Instructions (Signed)
Visit Information ? ?Thank you for taking time to visit with me today. Please don't hesitate to contact me if I can be of assistance to you before our next scheduled telephone appointment. ? ?Following are the goals we discussed today:  ?Patient Goals/Self-Care Activities: ?Continue to receive personal counseling with LCSW, on a bi-weekly basis, in an effort to reduce and manage symptoms of Depression, until well-controlled.   ?Continue to incorporate into daily practice - relaxation techniques, deep breathing exercises, and mindfulness meditation strategies. ?Thorough review of EMMI Educational Material on "When You Have Depression and Other Health Problems" and "Getting the Care You Need", to ensure understanding. ?Continue to receive twice weekly outpatient physical therapy with Margie Billet, Physical Therapist at [facility], to improve strengthening, functioning, mobility, safety, balance, and ambulation techniques. ?Contact LCSW directly (# [phone]), if you have questions, need assistance, or if additional social work needs are identified between now and our next scheduled telephone outreach call.  ?Follow-Up:  12/23/2021 at 12:45 pm ? ?Please call the care guide team at [phone] if you need to cancel or reschedule your appointment.  ? ?If you are experiencing a Mental Health or [place] or need someone to talk to, please call the Suicide and Crisis Lifeline: 988 ?call the [place] National Suicide Prevention Lifeline: 8073224320 or TTY: 650-083-4569 TTY 614 702 1968) to talk to a trained counselor ?call 1-800-273-TALK (toll free, 24 hour hotline) ?go to [facility] [address], [place] [phone]) ?call the [facility]: [phone] ?call 911  ? ?Patient verbalizes understanding of instructions and care plan provided today and agrees to view in [place]. Active MyChart status confirmed with patient.   ? ?Nat Christen LCSW ?Licensed Clinical Social Worker ?[facility] ?[phone]  ?

## 2021-12-13 ENCOUNTER — Other Ambulatory Visit (HOSPITAL_BASED_OUTPATIENT_CLINIC_OR_DEPARTMENT_OTHER): Payer: Self-pay

## 2021-12-14 ENCOUNTER — Encounter (HOSPITAL_COMMUNITY): Payer: Self-pay | Admitting: Physical Therapy

## 2021-12-14 ENCOUNTER — Ambulatory Visit (HOSPITAL_COMMUNITY): Payer: Medicare Other | Admitting: Physical Therapy

## 2021-12-14 DIAGNOSIS — R2689 Other abnormalities of gait and mobility: Secondary | ICD-10-CM | POA: Diagnosis not present

## 2021-12-14 DIAGNOSIS — R5381 Other malaise: Secondary | ICD-10-CM | POA: Diagnosis not present

## 2021-12-14 DIAGNOSIS — R29898 Other symptoms and signs involving the musculoskeletal system: Secondary | ICD-10-CM

## 2021-12-14 DIAGNOSIS — M6281 Muscle weakness (generalized): Secondary | ICD-10-CM | POA: Diagnosis not present

## 2021-12-14 DIAGNOSIS — M25551 Pain in right hip: Secondary | ICD-10-CM | POA: Diagnosis not present

## 2021-12-14 NOTE — Therapy (Signed)
?OUTPATIENT PHYSICAL THERAPY TREATMENT NOTE ? ? ?Patient Name: Shaun [name] ?MRN: [id number] ?DOB:1955-10-08, 66 y.o., male ?Today's Date: 12/14/2021 ? ?PCP: Maximiano Coss, NP ?REFERRING PROVIDER: Charlett Blake, MD ? ?Progress Note  ? ?Reporting Period 11/11/21 to 12/14/21 ? ? See note below for Objective Data and Assessment of Progress/Goals ? ? ? PT End of Session - 12/14/21 1133   ? ? Visit Number 10   ? Number of Visits 24   ? Date for PT Re-Evaluation 01/25/22   ? Authorization Type [employer] Medicare   ? Progress Note Due on Visit 20   ? PT Start Time 1133   ? PT Stop Time 1213   ? PT Time Calculation (min) 40 min   ? Activity Tolerance Patient tolerated treatment well   ? Behavior During [facility] for tasks assessed/performed   ? ?  ?  ? ?  ? ? ? ?Past Medical History:  ?Diagnosis Date  ? Anemia   ? Anxiety   ? Asthma   ? CAD (coronary artery disease)   ? DES to distal circumflex 2016  ? Cataract   ? Colon polyps   ? 30 colon polyps found on first colonoscopy  ? Diastolic heart failure ([place])   ? Diverticulitis   ? DJD (degenerative joint disease)   ? GERD (gastroesophageal reflux disease)   ? History of kidney stones   ? Hyperlipidemia   ? Hypertension   ? Insomnia   ? Obstructive sleep apnea 12/2009  ? 01/26/2010 AHI 83/hr  ? Permanent atrial fibrillation ([place])   ? Onset 2006 paroxysmal then progressive to persistent  ? PUD (peptic ulcer disease)   ? 1980s  ? RLS (restless legs syndrome)   ? Sinusitis   ? Skin cancer   ? Type 2 diabetes mellitus ([place])   ? ?Past Surgical History:  ?Procedure Laterality Date  ? BIOPSY  07/17/2018  ? Procedure: BIOPSY;  Surgeon: Danie Binder, MD;  [facility];  Service: Endoscopy;;  colon  ? BOWEL RESECTION  09/17/2018  ? SMALL BOWEL RESECTION: 71 CM   ? CARDIAC CATHETERIZATION N/A 07/21/2015  ? Procedure: Left Heart Cath and Coronary Angiography;  Surgeon: Peter M Martinique, MD;  Location: [place] [facility];  Service: Cardiovascular;  Laterality: N/A;  ?  CARDIAC CATHETERIZATION N/A 07/21/2015  ? Procedure: Coronary Stent Intervention;  Surgeon: Peter M Martinique, MD;  Location: [place] [facility];  Service: Cardiovascular;  Laterality: N/A;  ? CIRCUMCISION N/A 04/05/2019  ? Procedure: CIRCUMCISION ADULT;  Surgeon: Irine Seal, MD;  Location: [facility];  Service: Urology;  Laterality: N/A;  ? COLONOSCOPY N/A 05/19/2014  ? Dr. Barnie Alderman diverticulosis/moderate external hemorrhoids, >20 simple adenomas. Genetic screening negative.   ? COLONOSCOPY WITH PROPOFOL N/A 07/17/2018  ? Dr. Oneida Alar: Diverticulosis, external/internal hemorrhoids, 32 colon polyps removed.  ten tubular adenomas removed with no high-grade dysplasia.  Advised to have surveillance colonoscopy in 3 years.  ? COLONOSCOPY WITH PROPOFOL N/A 06/07/2021  ? Procedure: COLONOSCOPY WITH PROPOFOL;  Surgeon: Eloise Harman, DO;  [facility];  Service: Endoscopy;  Laterality: N/A;  9:30 / ASA 3  (Pt was told that his time will be given at Pre-op)  ? ESOPHAGOGASTRODUODENOSCOPY (EGD) WITH PROPOFOL N/A 07/17/2018  ? Dr. Oneida Alar: Low-grade narrowing Schatzki ring at the GE junction status post dilation.  Gastritis.  Biopsy with mild nonspecific reactive gastropathy.  No H. pylori.  ? GIVENS CAPSULE STUDY N/A 06/24/2019  ? normal  ? HERNIA  REPAIR  1986  ? Left inguinal  ? INTRAVASCULAR PRESSURE WIRE/FFR STUDY Left 06/08/2017  ? Procedure: INTRAVASCULAR PRESSURE WIRE/FFR STUDY;  Surgeon: Nelva Bush, MD;  Location: [place] [facility];  Service: Cardiovascular;  Laterality: Left;  LAD and CFX  ? LAPAROTOMY N/A 09/17/2018  ? Procedure: EXPLORATORY LAPAROTOMY;  Surgeon: Virl Cagey, MD;  Location: [facility];  Service: General;  Laterality: N/A;  ? LEFT HEART CATH AND CORONARY ANGIOGRAPHY N/A 06/08/2017  ? Procedure: LEFT HEART CATH AND CORONARY ANGIOGRAPHY;  Surgeon: Nelva Bush, MD;  Location: [place] [facility];  Service: Cardiovascular;  Laterality: N/A;  ? POLYPECTOMY  07/17/2018  ? Procedure:  POLYPECTOMY;  Surgeon: Danie Binder, MD;  [facility];  Service: Endoscopy;;  colon  ? POLYPECTOMY  06/07/2021  ? Procedure: POLYPECTOMY INTESTINAL;  Surgeon: Eloise Harman, DO;  [facility];  Service: Endoscopy;;  ? ROTATOR CUFF REPAIR    ? Right  ? SAVORY DILATION N/A 07/17/2018  ? Procedure: SAVORY DILATION;  Surgeon: Danie Binder, MD;  [facility];  Service: Endoscopy;  Laterality: N/A;  ? ?Patient Active Problem List  ? Diagnosis Date Noted  ? Debility 10/21/2021  ? Acute respiratory failure ([place]) 08/25/2021  ? Sundowning 08/23/2021  ? Dysphagia 08/22/2021  ? Hypernatremia 08/22/2021  ? Obesity (BMI 30-39.9) 08/21/2021  ? Endotracheally intubated   ? Influenza   ? Acute respiratory failure with hypoxemia ([place]) 08/06/2021  ? Ventral hernia 12/31/2020  ? Nausea with vomiting 12/31/2019  ? Dyslipidemia, goal LDL below 70 06/18/2019  ? Hx of adenomatous colonic polyps 10/17/2018  ? Anemia 10/17/2018  ? Phimosis of penis 09/27/2018  ? Primary osteoarthritis of right hip 06/20/2018  ? Esophageal dysphagia 05/21/2018  ? GERD (gastroesophageal reflux disease) 05/21/2018  ? RLS (restless legs syndrome)   ? PUD (peptic ulcer disease)   ? Insomnia   ? Chronic diastolic CHF (congestive heart failure) ([place])   ? Asthma   ? Arteriosclerotic cardiovascular disease (ASCVD)   ? Chronic anticoagulation 01/27/2017  ? Depression 09/24/2015  ? COPD (chronic obstructive pulmonary disease) ([place]) 07/20/2015  ? Accelerating angina ([place]) 07/19/2015  ? Genetic testing 09/09/2014  ? H/O adenomatous polyp of colon 08/11/2014  ? Non-insulin treated type 2 diabetes mellitus ([place]) 02/09/2011  ? CAD S/P percutaneous coronary angioplasty   ? Tobacco abuse   ? Essential hypertension   ? Chronic atrial fibrillation ([place]) 02/23/2010  ? Obstructive sleep apnea 12/27/2009  ? ? ?REFERRING DIAG: M16.11 (ICD-10-CM) - Primary osteoarthritis of right hip R53.81 (ICD-10-CM) - Debility   ? ?THERAPY DIAG:  ?Pain  in right hip ? ?Debility ? ?Muscle weakness (generalized) ? ?Other abnormalities of gait and mobility ? ?Other symptoms and signs involving the musculoskeletal system ? ?PERTINENT HISTORY: Pneumonia 12/22, afib, COPD, CHF, DM, anxiety, depression ? ?PRECAUTIONS: Fall ? ?SUBJECTIVE: [place] yesterday when he was working outside. Sore in R hip and shoulder since fall. HEP is going well. Patient states 60-70% improvement since beginning PT. Has been able to get back to doing a lot more.  ? ?PAIN:  ?Are you having pain? Yes: NPRS scale: 2/10 ?Pain location: lateral/posterior  ?Pain description: aching ?Aggravating factors: movement, sleep position ?Relieving factors: recliner ? ? ? ?OBJECTIVE:  ?  ?Below in italics are FROM EVALUATION =   ? PATIENT SURVEYS:  ?FOTO 42% function; 12/14/21 57% function ?  ?OBSERVATION:  Ambulates without AD, unsteady with wide BOS, antalgic on L  ?  ?POSTURE:  ?  Frequently slouched with lateral lean in seated  ?  ?PALPATION: ?TTP R glute med/min and max ?  ?LE ROM: decreased R hip abd and extension > L  ?  ?Active ROM Right ?11/11/2021 Left ?11/11/2021  ?Hip flexion      ?Hip extension      ?Hip abduction      ?Hip adduction      ?Hip internal rotation      ?Hip external rotation      ?Knee flexion      ?Knee extension      ?Ankle dorsiflexion      ?Ankle plantarflexion      ?Ankle inversion      ?Ankle eversion      ? (Blank rows = not tested) ?  ?LE MMT: extension not tested as patient unable to lay prone ?  ?MMT Right ?11/11/2021 Left ?11/11/2021 Right ?12/14/21 Left  ?12/14/21  ?Hip flexion 4/5 4+/5 5/5 5/5  ?Hip extension        ?Hip abduction 3/5 3/5 3+/5 4-/5  ?Hip adduction        ?Hip internal rotation        ?Hip external rotation        ?Knee flexion 4+/5 5/5 5/5 5/5  ?Knee extension 4+/5 5/5 5/5 5/5  ?Ankle dorsiflexion 5/5 5/5    ?Ankle plantarflexion        ?Ankle inversion        ?Ankle eversion        ? (Blank rows = not tested) ?  ?  ?  ?FUNCTIONAL TESTS:  ?5 times sit to stand:  13.15 with heavy bilateral UE support ?2 minute walk test: 345 feet ?Transfers: Bilateral UE use, labored ? ?12/14/21 ?5 times sit to stand: 13.97 seconds without UE supports ?2 minute walk test: 395 feet ?Transfe

## 2021-12-15 ENCOUNTER — Ambulatory Visit: Payer: Medicare Other

## 2021-12-15 DIAGNOSIS — E1165 Type 2 diabetes mellitus with hyperglycemia: Secondary | ICD-10-CM

## 2021-12-15 DIAGNOSIS — J439 Emphysema, unspecified: Secondary | ICD-10-CM

## 2021-12-15 DIAGNOSIS — I10 Essential (primary) hypertension: Secondary | ICD-10-CM

## 2021-12-15 DIAGNOSIS — E785 Hyperlipidemia, unspecified: Secondary | ICD-10-CM

## 2021-12-15 DIAGNOSIS — I482 Chronic atrial fibrillation, unspecified: Secondary | ICD-10-CM

## 2021-12-15 DIAGNOSIS — I2510 Atherosclerotic heart disease of native coronary artery without angina pectoris: Secondary | ICD-10-CM

## 2021-12-15 DIAGNOSIS — I5032 Chronic diastolic (congestive) heart failure: Secondary | ICD-10-CM

## 2021-12-15 NOTE — Chronic Care Management (AMB) (Signed)
?Chronic Care Management  ? ?CCM RN Visit Note ? ?12/15/2021 ?Name: Shaun [name] MRN: [id number] DOB: 13-Jul-1956 ? ?Subjective: ?Shaun [name] is a 66 y.o. year old male who is a primary care patient of Maximiano Coss, NP. The care management team was consulted for assistance with disease management and care coordination needs.   ? ?Engaged with patient by telephone for follow up visit in response to provider referral for case management and/or care coordination services.  ? ?Consent to Services:  ?The patient was given information about Chronic Care Management services, agreed to services, and gave verbal consent prior to initiation of services.  Please see initial visit note for detailed documentation.  ? ?Patient agreed to services and verbal consent obtained.  ? ?Assessment: Review of patient past medical history, allergies, medications, health status, including review of consultants reports, laboratory and other test data, was performed as part of comprehensive evaluation and provision of chronic care management services.  ? ?SDOH (Social Determinants of Health) assessments and interventions performed:   ? ?CCM Care Plan ? ?No Known Allergies ? ?Outpatient Encounter Medications as of 12/15/2021  ?Medication Sig Note  ? acetaminophen (TYLENOL) 325 MG tablet Take 2 tablets (650 mg total) by mouth every 6 (six) hours as needed for mild pain.   ? albuterol (VENTOLIN HFA) 108 (90 Base) MCG/ACT inhaler INHALE 2 PUFFS INTO THE LUNGS EVERY 6 HOURS AS NEEDED FOR WHEEZING OR SHORTNESS OF BREATH   ? amoxicillin-clavulanate (AUGMENTIN) 875-125 MG tablet Take 1 tablet by mouth 2 (two) times daily.   ? apixaban (ELIQUIS) 5 MG TABS tablet TAKE 1 TABLET (5 MG) BY MOUTH TWICE DAILY   ? atorvastatin (LIPITOR) 80 MG tablet Take 1 tablet (80 mg total) by mouth daily. TAKE 1 TABLET(80 MG) BY MOUTH EVERY EVENING   ? azelastine (ASTELIN) 0.1 % nasal spray Place 1 spray into both nostrils 2 (two) times daily. Use in each  nostril as directed   ? buPROPion (WELLBUTRIN XL) 150 MG 24 hr tablet Take 3 tablets once daily   ? docusate sodium (COLACE) 100 MG capsule Take 100 mg by mouth daily as needed for mild constipation.   ? escitalopram (LEXAPRO) 20 MG tablet TAKE 1 TABLET(20 MG) BY MOUTH DAILY   ? ezetimibe (ZETIA) 10 MG tablet TAKE 1 TABLET BY MOUTH EVERY DAY   ? ferrous sulfate 325 (65 FE) MG tablet Take 325 mg by mouth daily with breakfast.   ? furosemide (LASIX) 40 MG tablet Take 1 tablet (40 mg total) by mouth daily.   ? magnesium gluconate (MAGONATE) 500 MG tablet Take 0.5 tablets (250 mg total) by mouth at bedtime.   ? metFORMIN (GLUCOPHAGE) 500 MG tablet TAKE 1 TABLET (500 MG) BY MOUTH TWICE DAILY WITH A MEAL.   ? Metoprolol Tartrate 75 MG TABS Take 75 mg by mouth 2 (two) times daily.   ? nitroGLYCERIN (NITROSTAT) 0.4 MG SL tablet Place 1 tablet (0.4 mg total) under the tongue every 5 (five) minutes as needed.   ? omeprazole (PRILOSEC) 40 MG capsule Take 1 capsule by mouth once daily 30 minutes before breakfast   ? potassium chloride SA (KLOR-CON M) 20 MEQ tablet Take 1 tablet (20 mEq total) by mouth daily.   ? predniSONE (DELTASONE) 20 MG tablet Take 1 tablet (20 mg total) by mouth daily with breakfast.   ? QUEtiapine (SEROQUEL) 50 MG tablet Take 1 tablet (50 mg total) by mouth at bedtime.   ? rOPINIRole (REQUIP) 3 MG tablet TAKE  1 TABLET BY MOUTH EVERY NIGHT AT BEDTIME   ? sildenafil (VIAGRA) 100 MG tablet TAKE 1 TABLET BY MOUTH 30 MINUTES BEFORE ACTIVITY   ? sucralfate (CARAFATE) 1 g tablet Take one tablet po BID PRN (Patient taking differently: Take 1 g by mouth 2 (two) times daily as needed (acid reflux).)   ? traMADol (ULTRAM) 50 MG tablet TAKE 1 TABLET BY MOUTH TWICE DAILY AS NEEDED FOR MODERATE PAIN. 12/07/2021: Prescribed by Maximiano Coss, NP  ? ?No facility-administered encounter medications on file as of 12/15/2021.  ? ? ?Patient Active Problem List  ? Diagnosis Date Noted  ? Debility 10/21/2021  ? Acute respiratory  failure ([place]) 08/25/2021  ? Sundowning 08/23/2021  ? Dysphagia 08/22/2021  ? Hypernatremia 08/22/2021  ? Obesity (BMI 30-39.9) 08/21/2021  ? Endotracheally intubated   ? Influenza   ? Acute respiratory failure with hypoxemia ([place]) 08/06/2021  ? Ventral hernia 12/31/2020  ? Nausea with vomiting 12/31/2019  ? Dyslipidemia, goal LDL below 70 06/18/2019  ? Hx of adenomatous colonic polyps 10/17/2018  ? Anemia 10/17/2018  ? Phimosis of penis 09/27/2018  ? Primary osteoarthritis of right hip 06/20/2018  ? Esophageal dysphagia 05/21/2018  ? GERD (gastroesophageal reflux disease) 05/21/2018  ? RLS (restless legs syndrome)   ? PUD (peptic ulcer disease)   ? Insomnia   ? Chronic diastolic CHF (congestive heart failure) ([place])   ? Asthma   ? Arteriosclerotic cardiovascular disease (ASCVD)   ? Chronic anticoagulation 01/27/2017  ? Depression 09/24/2015  ? COPD (chronic obstructive pulmonary disease) ([place]) 07/20/2015  ? Accelerating angina ([place]) 07/19/2015  ? Genetic testing 09/09/2014  ? H/O adenomatous polyp of colon 08/11/2014  ? Non-insulin treated type 2 diabetes mellitus ([place]) 02/09/2011  ? CAD S/P percutaneous coronary angioplasty   ? Tobacco abuse   ? Essential hypertension   ? Chronic atrial fibrillation ([place]) 02/23/2010  ? Obstructive sleep apnea 12/27/2009  ? ? ?Conditions to be addressed/monitored:Atrial Fibrillation, CHF, CAD, HTN, HLD, COPD, and DMII ? ?Care Plan : RN Care Manager Plan of Care  ?Updates made by Dimitri Ped, RN since 12/15/2021 12:00 AM  ?  ? ?Problem: Chronic Disease Management and Care Coordination Needs (DM,CHF, CAD, HTN, Atrial Fib,COPD, depression, HLD)   ?Priority: High  ?  ? ?Long-Range Goal: Establish Plan of Care for Chronic Disease Management Needs (DM,CHF, CAD, HTN, Atrial Fib,COPD, depression, HLD)   ?Start Date: 09/22/2021  ?Expected End Date: 09/19/2022  ?Priority: High  ?Note:   ?Current Barriers:  ?Knowledge Deficits related to plan of care for management of Atrial Fibrillation,  CHF, CAD, HTN, HLD, COPD, DMII, and Depression  ?Chronic Disease Management support and education needs related to Atrial Fibrillation, CHF, CAD, HTN, HLD, COPD, DMII, and Depression  ?No Advanced Directives in place ?States that he has been  sleeping better at night now.  States he goes to bed at 1030 PM and gets up around 6 AM.  States he does wake up a few times but goes back to sleep.  States that talking with the LCSW has helped.  States he is now going to PT and he is feeling stronger and doing more.  States he is trying to walk his dog and tries to walk at least 1 hr a day.  States his CBGs are ranging 120-149 at different times of the day Denies any low readings.  States he is weighting daily and his weight is stable around 213.  Denies any chest pains or swelling.  States he gets  winded if he walks too fast and thinks it is from his A Fib not his lungs. States he can do more before he gets winded.  Denies any coughing or choking.  States he is trying to eat healthy and follows a low sodium diet.  States his B/P has ranging 105-120/70-75 with a few lower readings around 96/60.  States he did feel  more tired when his B/P was lower but he may not be drinking enough fluids.  States his rt hip pain is better since he got a shot and going to PT ? ?RNCM Clinical Goal(s):  ?Patient will verbalize understanding of plan for management of Atrial Fibrillation, CHF, CAD, HTN, HLD, COPD, DMII, and Depression as evidenced by voiced adherence to plan of care ?verbalize basic understanding of  Atrial Fibrillation, CHF, CAD, HTN, HLD, COPD, DMII, and Depression disease process and self health management plan as evidenced by voiced understanding and teach back ?take all medications exactly as prescribed and will call provider for medication related questions as evidenced by dispense report and pt verbalization ?attend all scheduled medical appointments: PT 12/16/21,Cardiology 02/25/22, Kathrin Ruddy NP 04/19/22, Dr. Letta Pate  02/08/22, CCM LCSW 12/23/21 as evidenced by medical records ?demonstrate Improved adherence to prescribed treatment plan for Atrial Fibrillation, CHF, CAD, HTN, HLD, COPD, DMII, and Depression as evidenced by

## 2021-12-15 NOTE — Patient Instructions (Signed)
Visit Information ? ?Thank you for taking time to visit with me today. Please don't hesitate to contact me if I can be of assistance to you before our next scheduled telephone appointment. ? ?Following are the goals we discussed today:  ?Take all medications as prescribed ?Attend all scheduled provider appointments ?Perform all self care activities independently  ?Call provider office for new concerns or questions  ?call office if I gain more than 2 pounds in one day or 5 pounds in one week ?keep legs up while sitting ?watch for swelling in feet, ankles and legs every day ?weigh myself daily ?follow rescue plan if symptoms flare-up ?keep appointment with eye doctor ?check blood sugar at prescribed times: once daily and when you have symptoms of low or high blood sugar ?check feet daily for cuts, sores or redness ?take the blood sugar log to all doctor visits ?drink 6 to 8 glasses of water each day ?fill half of plate with vegetables ?manage portion size ?switch to sugar-free drinks ?keep feet up while sitting ?wash and dry feet carefully every day ?eliminate smoking in my home ?identify and remove indoor air pollutants ?limit outdoor activity during cold weather ?eliminate symptom triggers at home ?check pulse (heart) rate once a day ?make a plan to exercise regularly ?make a plan to eat healthy ?check blood pressure daily ?choose a place to take my blood pressure (home, clinic or office, retail store) ?take blood pressure log to all doctor appointments ?take medications for blood pressure exactly as prescribed ?report new symptoms to your doctor ?eat more whole grains, fruits and vegetables, lean meats and healthy fats ?limit salt intake to '2300mg'$ /day ?call for medicine refill 2 or 3 days before it runs out ?take all medications exactly as prescribed ?call doctor with any symptoms you believe are related to your medicine ? ?Our next appointment is by telephone on 02/09/22 at 2:15 PM ? ?Please call the care guide  team at [phone] if you need to cancel or reschedule your appointment.  ? ?If you are experiencing a Mental Health or [place] or need someone to talk to, please call the Suicide and Crisis Lifeline: 988 ?call the [place] National Suicide Prevention Lifeline: (619) 216-8358 or TTY: 501 347 1203 TTY 2178181168) to talk to a trained counselor ?call 1-800-273-TALK (toll free, 24 hour hotline) ?go to [facility] [address], [place] [phone]) ?call 911  ? ?Patient verbalizes understanding of instructions and care plan provided today and agrees to view in [place]. Active MyChart status confirmed with patient.   ? ?Peter Garter RN, BSN,CCM, CDE ?Care Management Coordinator ?[facility] ?[phone]   ?

## 2021-12-16 ENCOUNTER — Encounter (HOSPITAL_COMMUNITY): Payer: Self-pay | Admitting: Physical Therapy

## 2021-12-16 ENCOUNTER — Ambulatory Visit (HOSPITAL_COMMUNITY): Payer: Medicare Other | Admitting: Physical Therapy

## 2021-12-16 DIAGNOSIS — M6281 Muscle weakness (generalized): Secondary | ICD-10-CM

## 2021-12-16 DIAGNOSIS — R29898 Other symptoms and signs involving the musculoskeletal system: Secondary | ICD-10-CM

## 2021-12-16 DIAGNOSIS — M25551 Pain in right hip: Secondary | ICD-10-CM

## 2021-12-16 DIAGNOSIS — R2689 Other abnormalities of gait and mobility: Secondary | ICD-10-CM | POA: Diagnosis not present

## 2021-12-16 DIAGNOSIS — R5381 Other malaise: Secondary | ICD-10-CM

## 2021-12-16 NOTE — Therapy (Signed)
?OUTPATIENT PHYSICAL THERAPY TREATMENT NOTE ? ? ?Patient Name: Shaun [name] ?MRN: [id number] ?DOB:Apr 16, 1956, 66 y.o., male ?Today's Date: 12/16/2021 ? ?PCP: Maximiano Coss, NP ?REFERRING PROVIDER: Charlett Blake, MD ? ? ? ? ? PT End of Session - 12/16/21 1042   ? ? Visit Number 11   ? Number of Visits 24   ? Date for PT Re-Evaluation 01/25/22   ? Authorization Type UHC Medicare   ? Progress Note Due on Visit 20   ? PT Start Time 1045   ? PT Stop Time 1125   ? PT Time Calculation (min) 40 min   ? Activity Tolerance Patient tolerated treatment well   ? Behavior During Therapy [facility] for tasks assessed/performed   ? ?  ?  ? ?  ? ? ? ?Past Medical History:  ?Diagnosis Date  ? Anemia   ? Anxiety   ? Asthma   ? CAD (coronary artery disease)   ? DES to distal circumflex 2016  ? Cataract   ? Colon polyps   ? 30 colon polyps found on first colonoscopy  ? Diastolic heart failure ([place])   ? Diverticulitis   ? DJD (degenerative joint disease)   ? GERD (gastroesophageal reflux disease)   ? History of kidney stones   ? Hyperlipidemia   ? Hypertension   ? Insomnia   ? Obstructive sleep apnea 12/2009  ? 01/26/2010 AHI 83/hr  ? Permanent atrial fibrillation ([place])   ? Onset 2006 paroxysmal then progressive to persistent  ? PUD (peptic ulcer disease)   ? 1980s  ? RLS (restless legs syndrome)   ? Sinusitis   ? Skin cancer   ? Type 2 diabetes mellitus ([place])   ? ?Past Surgical History:  ?Procedure Laterality Date  ? BIOPSY  07/17/2018  ? Procedure: BIOPSY;  Surgeon: Danie Binder, MD;  [facility];  Service: Endoscopy;;  colon  ? BOWEL RESECTION  09/17/2018  ? SMALL BOWEL RESECTION: 71 CM   ? CARDIAC CATHETERIZATION N/A 07/21/2015  ? Procedure: Left Heart Cath and Coronary Angiography;  Surgeon: Peter M Martinique, MD;  Location: [place] CV LAB;  Service: Cardiovascular;  Laterality: N/A;  ? CARDIAC CATHETERIZATION N/A 07/21/2015  ? Procedure: Coronary Stent Intervention;  Surgeon: Peter M Martinique, MD;  Location: [place] CV LAB;  Service: Cardiovascular;  Laterality: N/A;  ? CIRCUMCISION N/A 04/05/2019  ? Procedure: CIRCUMCISION ADULT;  Surgeon: Irine Seal, MD;  Location: [facility];  Service: Urology;  Laterality: N/A;  ? COLONOSCOPY N/A 05/19/2014  ? Dr. Barnie Alderman diverticulosis/moderate external hemorrhoids, >20 simple adenomas. Genetic screening negative.   ? COLONOSCOPY WITH PROPOFOL N/A 07/17/2018  ? Dr. Oneida Alar: Diverticulosis, external/internal hemorrhoids, 32 colon polyps removed.  ten tubular adenomas removed with no high-grade dysplasia.  Advised to have surveillance colonoscopy in 3 years.  ? COLONOSCOPY WITH PROPOFOL N/A 06/07/2021  ? Procedure: COLONOSCOPY WITH PROPOFOL;  Surgeon: Eloise Harman, DO;  [facility];  Service: Endoscopy;  Laterality: N/A;  9:30 / ASA 3  (Pt was told that his time will be given at Pre-op)  ? ESOPHAGOGASTRODUODENOSCOPY (EGD) WITH PROPOFOL N/A 07/17/2018  ? Dr. Oneida Alar: Low-grade narrowing Schatzki ring at the GE junction status post dilation.  Gastritis.  Biopsy with mild nonspecific reactive gastropathy.  No H. pylori.  ? GIVENS CAPSULE STUDY N/A 06/24/2019  ? normal  ? HERNIA REPAIR  1986  ? Left inguinal  ? INTRAVASCULAR PRESSURE WIRE/FFR STUDY Left 06/08/2017  ? Procedure: INTRAVASCULAR PRESSURE  WIRE/FFR STUDY;  Surgeon: Nelva Bush, MD;  Location: [place] [facility] LAB;  Service: Cardiovascular;  Laterality: Left;  LAD and CFX  ? LAPAROTOMY N/A 09/17/2018  ? Procedure: EXPLORATORY LAPAROTOMY;  Surgeon: Virl Cagey, MD;  Location: [facility];  Service: General;  Laterality: N/A;  ? LEFT HEART CATH AND CORONARY ANGIOGRAPHY N/A 06/08/2017  ? Procedure: LEFT HEART CATH AND CORONARY ANGIOGRAPHY;  Surgeon: Nelva Bush, MD;  Location: [place] CV LAB;  Service: Cardiovascular;  Laterality: N/A;  ? POLYPECTOMY  07/17/2018  ? Procedure: POLYPECTOMY;  Surgeon: Danie Binder, MD;  [facility];  Service: Endoscopy;;  colon  ? POLYPECTOMY  06/07/2021  ?  Procedure: POLYPECTOMY INTESTINAL;  Surgeon: Eloise Harman, DO;  [facility];  Service: Endoscopy;;  ? ROTATOR CUFF REPAIR    ? Right  ? SAVORY DILATION N/A 07/17/2018  ? Procedure: SAVORY DILATION;  Surgeon: Danie Binder, MD;  [facility];  Service: Endoscopy;  Laterality: N/A;  ? ?Patient Active Problem List  ? Diagnosis Date Noted  ? Debility 10/21/2021  ? Acute respiratory failure ([place]) 08/25/2021  ? Sundowning 08/23/2021  ? Dysphagia 08/22/2021  ? Hypernatremia 08/22/2021  ? Obesity (BMI 30-39.9) 08/21/2021  ? Endotracheally intubated   ? Influenza   ? Acute respiratory failure with hypoxemia ([place]) 08/06/2021  ? Ventral hernia 12/31/2020  ? Nausea with vomiting 12/31/2019  ? Dyslipidemia, goal LDL below 70 06/18/2019  ? Hx of adenomatous colonic polyps 10/17/2018  ? Anemia 10/17/2018  ? Phimosis of penis 09/27/2018  ? Primary osteoarthritis of right hip 06/20/2018  ? Esophageal dysphagia 05/21/2018  ? GERD (gastroesophageal reflux disease) 05/21/2018  ? RLS (restless legs syndrome)   ? PUD (peptic ulcer disease)   ? Insomnia   ? Chronic diastolic CHF (congestive heart failure) ([place])   ? Asthma   ? Arteriosclerotic cardiovascular disease (ASCVD)   ? Chronic anticoagulation 01/27/2017  ? Depression 09/24/2015  ? COPD (chronic obstructive pulmonary disease) ([place]) 07/20/2015  ? Accelerating angina ([place]) 07/19/2015  ? Genetic testing 09/09/2014  ? H/O adenomatous polyp of colon 08/11/2014  ? Non-insulin treated type 2 diabetes mellitus ([place]) 02/09/2011  ? CAD S/P percutaneous coronary angioplasty   ? Tobacco abuse   ? Essential hypertension   ? Chronic atrial fibrillation ([place]) 02/23/2010  ? Obstructive sleep apnea 12/27/2009  ? ? ?REFERRING DIAG: M16.11 (ICD-10-CM) - Primary osteoarthritis of right hip R53.81 (ICD-10-CM) - Debility   ? ?THERAPY DIAG:  ?Pain in right hip ? ?Debility ? ?Muscle weakness (generalized) ? ?Other abnormalities of gait and mobility ? ?Other symptoms and signs  involving the musculoskeletal system ? ?PERTINENT HISTORY: Pneumonia 12/22, afib, COPD, CHF, DM, anxiety, depression ? ?PRECAUTIONS: Fall ? ?SUBJECTIVE: Patient states doing alright, no falls. Feeling alright.   ? ?PAIN:  ?Are you having pain? Yes: NPRS scale: 2/10 ?Pain location: lateral/posterior  ?Pain description: aching ?Aggravating factors: movement, sleep position ?Relieving factors: recliner ? ? ? ?OBJECTIVE:  ?  ?Below in italics are FROM EVALUATION =   ? PATIENT SURVEYS:  ?FOTO 42% function; 12/14/21 57% function ?  ?OBSERVATION:  Ambulates without AD, unsteady with wide BOS, antalgic on L  ?  ?POSTURE:  ?Frequently slouched with lateral lean in seated  ?  ?PALPATION: ?TTP R glute med/min and max ?  ?LE ROM: decreased R hip abd and extension > L  ?  ?Active ROM Right ?11/11/2021 Left ?11/11/2021  ?Hip flexion      ?Hip extension      ?  Hip abduction      ?Hip adduction      ?Hip internal rotation      ?Hip external rotation      ?Knee flexion      ?Knee extension      ?Ankle dorsiflexion      ?Ankle plantarflexion      ?Ankle inversion      ?Ankle eversion      ? (Blank rows = not tested) ?  ?LE MMT: extension not tested as patient unable to lay prone ?  ?MMT Right ?11/11/2021 Left ?11/11/2021 Right ?12/14/21 Left  ?12/14/21  ?Hip flexion 4/5 4+/5 5/5 5/5  ?Hip extension        ?Hip abduction 3/5 3/5 3+/5 4-/5  ?Hip adduction        ?Hip internal rotation        ?Hip external rotation        ?Knee flexion 4+/5 5/5 5/5 5/5  ?Knee extension 4+/5 5/5 5/5 5/5  ?Ankle dorsiflexion 5/5 5/5    ?Ankle plantarflexion        ?Ankle inversion        ?Ankle eversion        ? (Blank rows = not tested) ?  ?  ?  ?FUNCTIONAL TESTS:  ?5 times sit to stand: 13.15 with heavy bilateral UE support ?2 minute walk test: 345 feet ?Transfers: Bilateral UE use, labored ? ?12/14/21 ?5 times sit to stand: 13.97 seconds without UE supports ?2 minute walk test: 395 feet ?Transfers: without UE support ?  ?GAIT: ?Distance walked: 395  feet ?Assistive device utilized: None ?Level of assistance: Modified independence ?Comments: 2MWT, antalgic, decreased R hip extension, slight trunk flexion for limited r hip extension, antalgic ?  ?  ?  ?TODAY'S T

## 2021-12-20 ENCOUNTER — Other Ambulatory Visit (HOSPITAL_BASED_OUTPATIENT_CLINIC_OR_DEPARTMENT_OTHER): Payer: Self-pay

## 2021-12-20 ENCOUNTER — Encounter: Payer: Self-pay | Admitting: Internal Medicine

## 2021-12-21 ENCOUNTER — Encounter (HOSPITAL_COMMUNITY): Payer: Medicare Other | Admitting: Physical Therapy

## 2021-12-23 ENCOUNTER — Ambulatory Visit: Payer: Medicare Other | Admitting: *Deleted

## 2021-12-23 ENCOUNTER — Encounter (HOSPITAL_COMMUNITY): Payer: Self-pay

## 2021-12-23 ENCOUNTER — Ambulatory Visit (HOSPITAL_COMMUNITY): Payer: Medicare Other

## 2021-12-23 ENCOUNTER — Encounter: Payer: Self-pay | Admitting: *Deleted

## 2021-12-23 DIAGNOSIS — R2689 Other abnormalities of gait and mobility: Secondary | ICD-10-CM

## 2021-12-23 DIAGNOSIS — R5381 Other malaise: Secondary | ICD-10-CM | POA: Diagnosis not present

## 2021-12-23 DIAGNOSIS — R270 Ataxia, unspecified: Secondary | ICD-10-CM

## 2021-12-23 DIAGNOSIS — R29898 Other symptoms and signs involving the musculoskeletal system: Secondary | ICD-10-CM

## 2021-12-23 DIAGNOSIS — F321 Major depressive disorder, single episode, moderate: Secondary | ICD-10-CM

## 2021-12-23 DIAGNOSIS — M6281 Muscle weakness (generalized): Secondary | ICD-10-CM

## 2021-12-23 DIAGNOSIS — I10 Essential (primary) hypertension: Secondary | ICD-10-CM

## 2021-12-23 DIAGNOSIS — J439 Emphysema, unspecified: Secondary | ICD-10-CM

## 2021-12-23 DIAGNOSIS — M25551 Pain in right hip: Secondary | ICD-10-CM

## 2021-12-23 DIAGNOSIS — E785 Hyperlipidemia, unspecified: Secondary | ICD-10-CM

## 2021-12-23 DIAGNOSIS — I5032 Chronic diastolic (congestive) heart failure: Secondary | ICD-10-CM

## 2021-12-23 DIAGNOSIS — I482 Chronic atrial fibrillation, unspecified: Secondary | ICD-10-CM

## 2021-12-23 NOTE — Therapy (Signed)
?OUTPATIENT PHYSICAL THERAPY TREATMENT NOTE ? ? ?Patient Name: Shaun [name] ?MRN: [id number] ?DOB:12/10/1955, 66 y.o., male ?Today's Date: 12/23/2021 ? ?PCP: Maximiano Coss, NP ?REFERRING PROVIDER: Charlett Blake, MD ? ? ? ? ? PT End of Session - 12/23/21 1006   ? ? Visit Number 12   ? Number of Visits 24   ? Date for PT Re-Evaluation 01/25/22   ? Authorization Type UHC Medicare   ? Progress Note Due on Visit 20   ? PT Start Time 1000   ? PT Stop Time 1045   ? PT Time Calculation (min) 45 min   ? Activity Tolerance Patient tolerated treatment well   ? Behavior During Therapy [facility] for tasks assessed/performed   ? ?  ?  ? ?  ? ? ? ? ?Past Medical History:  ?Diagnosis Date  ? Anemia   ? Anxiety   ? Asthma   ? CAD (coronary artery disease)   ? DES to distal circumflex 2016  ? Cataract   ? Colon polyps   ? 30 colon polyps found on first colonoscopy  ? Diastolic heart failure ([place])   ? Diverticulitis   ? DJD (degenerative joint disease)   ? GERD (gastroesophageal reflux disease)   ? History of kidney stones   ? Hyperlipidemia   ? Hypertension   ? Insomnia   ? Obstructive sleep apnea 12/2009  ? 01/26/2010 AHI 83/hr  ? Permanent atrial fibrillation ([place])   ? Onset 2006 paroxysmal then progressive to persistent  ? PUD (peptic ulcer disease)   ? 1980s  ? RLS (restless legs syndrome)   ? Sinusitis   ? Skin cancer   ? Type 2 diabetes mellitus ([place])   ? ?Past Surgical History:  ?Procedure Laterality Date  ? BIOPSY  07/17/2018  ? Procedure: BIOPSY;  Surgeon: Danie Binder, MD;  [facility];  Service: Endoscopy;;  colon  ? BOWEL RESECTION  09/17/2018  ? SMALL BOWEL RESECTION: 71 CM   ? CARDIAC CATHETERIZATION N/A 07/21/2015  ? Procedure: Left Heart Cath and Coronary Angiography;  Surgeon: Peter M Martinique, MD;  Location: [place] [facility] LAB;  Service: Cardiovascular;  Laterality: N/A;  ? CARDIAC CATHETERIZATION N/A 07/21/2015  ? Procedure: Coronary Stent Intervention;  Surgeon: Peter M Martinique, MD;  Location: [place] [facility] LAB;  Service: Cardiovascular;  Laterality: N/A;  ? CIRCUMCISION N/A 04/05/2019  ? Procedure: CIRCUMCISION ADULT;  Surgeon: Irine Seal, MD;  Location: [facility];  Service: Urology;  Laterality: N/A;  ? COLONOSCOPY N/A 05/19/2014  ? Dr. Barnie Alderman diverticulosis/moderate external hemorrhoids, >20 simple adenomas. Genetic screening negative.   ? COLONOSCOPY WITH PROPOFOL N/A 07/17/2018  ? Dr. Oneida Alar: Diverticulosis, external/internal hemorrhoids, 32 colon polyps removed.  ten tubular adenomas removed with no high-grade dysplasia.  Advised to have surveillance colonoscopy in 3 years.  ? COLONOSCOPY WITH PROPOFOL N/A 06/07/2021  ? Procedure: COLONOSCOPY WITH PROPOFOL;  Surgeon: Eloise Harman, DO;  [facility];  Service: Endoscopy;  Laterality: N/A;  9:30 / ASA 3  (Pt was told that his time will be given at Pre-op)  ? ESOPHAGOGASTRODUODENOSCOPY (EGD) WITH PROPOFOL N/A 07/17/2018  ? Dr. Oneida Alar: Low-grade narrowing Schatzki ring at the GE junction status post dilation.  Gastritis.  Biopsy with mild nonspecific reactive gastropathy.  No H. pylori.  ? GIVENS CAPSULE STUDY N/A 06/24/2019  ? normal  ? HERNIA REPAIR  1986  ? Left inguinal  ? INTRAVASCULAR PRESSURE WIRE/FFR STUDY Left 06/08/2017  ? Procedure: INTRAVASCULAR  PRESSURE WIRE/FFR STUDY;  Surgeon: Nelva Bush, MD;  Location: [place] [facility] LAB;  Service: Cardiovascular;  Laterality: Left;  LAD and CFX  ? LAPAROTOMY N/A 09/17/2018  ? Procedure: EXPLORATORY LAPAROTOMY;  Surgeon: Virl Cagey, MD;  [facility];  Service: General;  Laterality: N/A;  ? LEFT HEART CATH AND CORONARY ANGIOGRAPHY N/A 06/08/2017  ? Procedure: LEFT HEART CATH AND CORONARY ANGIOGRAPHY;  Surgeon: Nelva Bush, MD;  Location: [place] [facility];  Service: Cardiovascular;  Laterality: N/A;  ? POLYPECTOMY  07/17/2018  ? Procedure: POLYPECTOMY;  Surgeon: Danie Binder, MD;  [facility];  Service: Endoscopy;;  colon  ? POLYPECTOMY  06/07/2021  ?  Procedure: POLYPECTOMY INTESTINAL;  Surgeon: Eloise Harman, DO;  [facility];  Service: Endoscopy;;  ? ROTATOR CUFF REPAIR    ? Right  ? SAVORY DILATION N/A 07/17/2018  ? Procedure: SAVORY DILATION;  Surgeon: Danie Binder, MD;  [facility];  Service: Endoscopy;  Laterality: N/A;  ? ?Patient Active Problem List  ? Diagnosis Date Noted  ? Debility 10/21/2021  ? Acute respiratory failure ([place]) 08/25/2021  ? Sundowning 08/23/2021  ? Dysphagia 08/22/2021  ? Hypernatremia 08/22/2021  ? Obesity (BMI 30-39.9) 08/21/2021  ? Endotracheally intubated   ? Influenza   ? Acute respiratory failure with hypoxemia ([place]) 08/06/2021  ? Ventral hernia 12/31/2020  ? Nausea with vomiting 12/31/2019  ? Dyslipidemia, goal LDL below 70 06/18/2019  ? Hx of adenomatous colonic polyps 10/17/2018  ? Anemia 10/17/2018  ? Phimosis of penis 09/27/2018  ? Primary osteoarthritis of right hip 06/20/2018  ? Esophageal dysphagia 05/21/2018  ? GERD (gastroesophageal reflux disease) 05/21/2018  ? RLS (restless legs syndrome)   ? PUD (peptic ulcer disease)   ? Insomnia   ? Chronic diastolic CHF (congestive heart failure) ([place])   ? Asthma   ? Arteriosclerotic cardiovascular disease (ASCVD)   ? Chronic anticoagulation 01/27/2017  ? Depression 09/24/2015  ? COPD (chronic obstructive pulmonary disease) ([place]) 07/20/2015  ? Accelerating angina ([place]) 07/19/2015  ? Genetic testing 09/09/2014  ? H/O adenomatous polyp of colon 08/11/2014  ? Non-insulin treated type 2 diabetes mellitus ([place]) 02/09/2011  ? CAD S/P percutaneous coronary angioplasty   ? Tobacco abuse   ? Essential hypertension   ? Chronic atrial fibrillation ([place]) 02/23/2010  ? Obstructive sleep apnea 12/27/2009  ? ? ?REFERRING DIAG: M16.11 (ICD-10-CM) - Primary osteoarthritis of right hip R53.81 (ICD-10-CM) - Debility   ? ?THERAPY DIAG:  ?Pain in right hip ? ?Debility ? ?Muscle weakness (generalized) ? ?Other abnormalities of gait and mobility ? ?Other symptoms and signs  involving the musculoskeletal system ? ?PERTINENT HISTORY: Pneumonia 12/22, afib, COPD, CHF, DM, anxiety, depression ? ?PRECAUTIONS: Fall ? ?SUBJECTIVE: Pt stated hip are sore today, did some yard work yesterday. ? ?PAIN:  ?Are you having pain? Yes: NPRS scale: 2/10 ?Pain location: Rt hip ?Pain description: sore ?Aggravating factors: movement, sleep position ?Relieving factors: recliner ? ? ? ?OBJECTIVE:  ?  ?Below in italics are FROM EVALUATION =   ? PATIENT SURVEYS:  ?FOTO 42% function; 12/14/21 57% function ?  ?OBSERVATION:  Ambulates without AD, unsteady with wide BOS, antalgic on L  ?  ?POSTURE:  ?Frequently slouched with lateral lean in seated  ?  ?PALPATION: ?TTP R glute med/min and max ?  ?LE ROM: decreased R hip abd and extension > L  ?  ?Active ROM Right ?11/11/2021 Left ?11/11/2021  ?Hip flexion      ?  Hip extension      ?Hip abduction      ?Hip adduction      ?Hip internal rotation      ?Hip external rotation      ?Knee flexion      ?Knee extension      ?Ankle dorsiflexion      ?Ankle plantarflexion      ?Ankle inversion      ?Ankle eversion      ? (Blank rows = not tested) ?  ?LE MMT: extension not tested as patient unable to lay prone ?  ?MMT Right ?11/11/2021 Left ?11/11/2021 Right ?12/14/21 Left  ?12/14/21  ?Hip flexion 4/5 4+/5 5/5 5/5  ?Hip extension        ?Hip abduction 3/5 3/5 3+/5 4-/5  ?Hip adduction        ?Hip internal rotation        ?Hip external rotation        ?Knee flexion 4+/5 5/5 5/5 5/5  ?Knee extension 4+/5 5/5 5/5 5/5  ?Ankle dorsiflexion 5/5 5/5    ?Ankle plantarflexion        ?Ankle inversion        ?Ankle eversion        ? (Blank rows = not tested) ?  ?  ?  ?FUNCTIONAL TESTS:  ?5 times sit to stand: 13.15 with heavy bilateral UE support ?2 minute walk test: 345 feet ?Transfers: Bilateral UE use, labored ? ?12/14/21 ?5 times sit to stand: 13.97 seconds without UE supports ?2 minute walk test: 395 feet ?Transfers: without UE support ?  ?GAIT: ?Distance walked: 395 feet ?Assistive device  utilized: None ?Level of assistance: Modified independence ?Comments: 2MWT, antalgic, decreased R hip extension, slight trunk flexion for limited r hip extension, antalgic ?  ?  ?  ?TODAY'S TREATMENT: ?

## 2021-12-23 NOTE — Patient Instructions (Addendum)
Visit Information ? ?Thank you for taking time to visit with me today. Please don't hesitate to contact me if I can be of assistance to you before our next scheduled telephone appointment. ? ?Following are the goals we discussed today:  ?Patient Goals/Self-Care Activities: ?Continue to receive personal counseling with LCSW, on a bi-weekly basis, in an effort to reduce and manage symptoms of Depression, until well-controlled.   ?Continue to incorporate into daily practice - relaxation techniques, deep breathing exercises, and mindfulness meditation strategies. ?Continue to receive twice weekly outpatient physical therapy with Margie Billet, Physical Therapist at [facility], to improve strengthening, functioning, mobility, safety, balance, and ambulation techniques. ?Review list of [place] in [facility], mailed to your home by LCSW on 12/23/2021, in an effort to establish long-term counseling and supportive services. ?Review list of Therapists and Psychiatrists in [place], [place], mailed to your home by LCSW on 12/23/2021, in an effort to establish long-term counseling and supportive services. ?Review "12 Common Symptoms of Depression", mailed to your home by LCSW on 12/23/2021, and be prepared to discuss during our next scheduled telephone outreach call. ?Contact LCSW directly (# [phone]), if you have questions, need assistance, or if additional social work needs are identified in the near future.   ?Follow-Up:  01/06/2022 at 1:30 pm ? ?Please call the care guide team at [phone] if you need to cancel or reschedule your appointment.  ? ?If you are experiencing a Mental Health or [place] or need someone to talk to, please call the Suicide and Crisis Lifeline: 988 ?call the [place] National Suicide Prevention Lifeline: 540 263 2707 or TTY: 909-772-9297 TTY 605-394-8580) to talk to a trained counselor ?call 1-800-273-TALK (toll free, 24 hour hotline) ?go to  [facility] [address] [place] [phone]) ?call the [facility]: [phone] ?call 911  ? ?Patient verbalizes understanding of instructions and care plan provided today and agrees to view in [place]. Active MyChart status confirmed with patient.   ? ?Nat Christen LCSW ?Licensed Clinical Social Worker ?[facility] ?[phone]  ?

## 2021-12-23 NOTE — Chronic Care Management (AMB) (Signed)
?Chronic Care Management  ? ? Clinical Social Work Note ? ?12/23/2021 ?Name: TARUN PATCHELL MRN: [id number] DOB: Aug 11, 1956 ? ?Deborah Dondero Prioleau is a 66 y.o. year old male who is a primary care patient of Maximiano Coss, NP. The CCM team was consulted to assist the patient with chronic disease management and/or care coordination needs related to: Appointment Scheduling Needs, [employer], and Mental Health Counseling and Resources.  ? ?Engaged with patient by telephone for follow up visit in response to provider referral for social work chronic care management and care coordination services.  ? ?Consent to Services:  ?The patient was given information about Chronic Care Management services, agreed to services, and gave verbal consent prior to initiation of services.  Please see initial visit note for detailed documentation.  ? ?Patient agreed to services and consent obtained.  ? ?Assessment: Review of patient past medical history, allergies, medications, and health status, including review of relevant consultants reports was performed today as part of a comprehensive evaluation and provision of chronic care management and care coordination services.    ? ?SDOH (Social Determinants of Health) assessments and interventions performed:   ? ?Advanced Directives Status: Not addressed in this encounter. ? ?CCM Care Plan ? ?No Known Allergies ? ?Outpatient Encounter Medications as of 12/23/2021  ?Medication Sig Note  ? acetaminophen (TYLENOL) 325 MG tablet Take 2 tablets (650 mg total) by mouth every 6 (six) hours as needed for mild pain.   ? albuterol (VENTOLIN HFA) 108 (90 Base) MCG/ACT inhaler INHALE 2 PUFFS INTO THE LUNGS EVERY 6 HOURS AS NEEDED FOR WHEEZING OR SHORTNESS OF BREATH   ? amoxicillin-clavulanate (AUGMENTIN) 875-125 MG tablet Take 1 tablet by mouth 2 (two) times daily.   ? apixaban (ELIQUIS) 5 MG TABS tablet TAKE 1 TABLET (5 MG) BY MOUTH TWICE DAILY   ? atorvastatin (LIPITOR) 80 MG tablet Take 1  tablet (80 mg total) by mouth daily. TAKE 1 TABLET(80 MG) BY MOUTH EVERY EVENING   ? azelastine (ASTELIN) 0.1 % nasal spray Place 1 spray into both nostrils 2 (two) times daily. Use in each nostril as directed   ? buPROPion (WELLBUTRIN XL) 150 MG 24 hr tablet Take 3 tablets once daily   ? docusate sodium (COLACE) 100 MG capsule Take 100 mg by mouth daily as needed for mild constipation.   ? escitalopram (LEXAPRO) 20 MG tablet TAKE 1 TABLET(20 MG) BY MOUTH DAILY   ? ezetimibe (ZETIA) 10 MG tablet TAKE 1 TABLET BY MOUTH EVERY DAY   ? ferrous sulfate 325 (65 FE) MG tablet Take 325 mg by mouth daily with breakfast.   ? furosemide (LASIX) 40 MG tablet Take 1 tablet (40 mg total) by mouth daily.   ? magnesium gluconate (MAGONATE) 500 MG tablet Take 0.5 tablets (250 mg total) by mouth at bedtime.   ? metFORMIN (GLUCOPHAGE) 500 MG tablet TAKE 1 TABLET (500 MG) BY MOUTH TWICE DAILY WITH A MEAL.   ? Metoprolol Tartrate 75 MG TABS Take 75 mg by mouth 2 (two) times daily.   ? nitroGLYCERIN (NITROSTAT) 0.4 MG SL tablet Place 1 tablet (0.4 mg total) under the tongue every 5 (five) minutes as needed.   ? omeprazole (PRILOSEC) 40 MG capsule Take 1 capsule by mouth once daily 30 minutes before breakfast   ? potassium chloride SA (KLOR-CON M) 20 MEQ tablet Take 1 tablet (20 mEq total) by mouth daily.   ? predniSONE (DELTASONE) 20 MG tablet Take 1 tablet (20 mg total) by mouth daily  with breakfast.   ? QUEtiapine (SEROQUEL) 50 MG tablet Take 1 tablet (50 mg total) by mouth at bedtime.   ? rOPINIRole (REQUIP) 3 MG tablet TAKE 1 TABLET BY MOUTH EVERY NIGHT AT BEDTIME   ? sildenafil (VIAGRA) 100 MG tablet TAKE 1 TABLET BY MOUTH 30 MINUTES BEFORE ACTIVITY   ? sucralfate (CARAFATE) 1 g tablet Take one tablet po BID PRN (Patient taking differently: Take 1 g by mouth 2 (two) times daily as needed (acid reflux).)   ? traMADol (ULTRAM) 50 MG tablet TAKE 1 TABLET BY MOUTH TWICE DAILY AS NEEDED FOR MODERATE PAIN. 12/07/2021: Prescribed by Maximiano Coss, NP  ? ?No facility-administered encounter medications on file as of 12/23/2021.  ? ? ?Patient Active Problem List  ? Diagnosis Date Noted  ? Debility 10/21/2021  ? Acute respiratory failure ([place]) 08/25/2021  ? Sundowning 08/23/2021  ? Dysphagia 08/22/2021  ? Hypernatremia 08/22/2021  ? Obesity (BMI 30-39.9) 08/21/2021  ? Endotracheally intubated   ? Influenza   ? Acute respiratory failure with hypoxemia ([place]) 08/06/2021  ? Ventral hernia 12/31/2020  ? Nausea with vomiting 12/31/2019  ? Dyslipidemia, goal LDL below 70 06/18/2019  ? Hx of adenomatous colonic polyps 10/17/2018  ? Anemia 10/17/2018  ? Phimosis of penis 09/27/2018  ? Primary osteoarthritis of right hip 06/20/2018  ? Esophageal dysphagia 05/21/2018  ? GERD (gastroesophageal reflux disease) 05/21/2018  ? RLS (restless legs syndrome)   ? PUD (peptic ulcer disease)   ? Insomnia   ? Chronic diastolic CHF (congestive heart failure) ([place])   ? Asthma   ? Arteriosclerotic cardiovascular disease (ASCVD)   ? Chronic anticoagulation 01/27/2017  ? Depression 09/24/2015  ? COPD (chronic obstructive pulmonary disease) ([place]) 07/20/2015  ? Accelerating angina ([place]) 07/19/2015  ? Genetic testing 09/09/2014  ? H/O adenomatous polyp of colon 08/11/2014  ? Non-insulin treated type 2 diabetes mellitus ([place]) 02/09/2011  ? CAD S/P percutaneous coronary angioplasty   ? Tobacco abuse   ? Essential hypertension   ? Chronic atrial fibrillation ([place]) 02/23/2010  ? Obstructive sleep apnea 12/27/2009  ? ? ?Conditions to be addressed/monitored: Anxiety and Depression. Limited Social Support, Mental Health Concerns, Social Isolation, and Lacks Knowledge of [employer]. ? ?Care Plan : LCSW Plan of Care  ?Updates made by Francis Gaines, LCSW since 12/23/2021 12:00 AM  ?  ? ?Problem: Reduce and Manage My Symptoms of Depression.   ?Priority: High  ?  ? ?Goal: Reduce and Manage My Symptoms of Depression.   ?Start Date: 11/04/2021  ?Expected End Date: 02/04/2022  ?This Visit's  Progress: On track  ?Recent Progress: On track  ?Priority: High  ?Note:   ?Current Barriers:   ?Acute Mental Health needs related to DM2, CHF, CAD, Atrial Fib, HTN, HLD, COPD, Depression, Obesity, Debility, Sundowning, Tobacco Abuse, and Chronic Anticoagulation, requires Support, Education, Resources, Referrals, Advocacy, and Care Coordination, in order to meet unmet Acute Mental Health needs. ?Clinical Goal(s):  ?Patient will work with LCSW, to reduce and manage symptoms of Depression, until well-controlled.     ?Patient will increase knowledge and/or ability of:  ?      Coping Skills, Healthy Habits, Self-Management Skills, Stress Reduction, Home Safety, and Utilizing [employer] and Resources.   ?Interventions: ?Collaboration with Primary Care Provider, Maximiano Coss regarding development and update of comprehensive plan of care, as evidenced by provider attestation and co-signature. ?Inter-disciplinary care team collaboration (see longitudinal plan of care). ?Clinical Interventions:  ?Mindfulness Meditation Strategies, Relaxation Techniques and Deep Breathing Exercises reinforced  daily. ?Verbalization of Feelings encouraged. ?Emotional Support provided. ?Client-Centered Therapy initiated. ?Cognitive Behavioral Therapy integrated. ?Support Group Participation encouraged. ?Increase Level of Activity/Exercise emphasized.  ?Estate agent of [place] in [facility], list of Therapists and Psychiatrists in [place], [place], and "12 Common Symptoms of Depression". ?Patient Goals/Self-Care Activities: ?Continue to receive personal counseling with LCSW, on a bi-weekly basis, in an effort to reduce and manage symptoms of Depression, until well-controlled.   ?Continue to incorporate into daily practice - relaxation techniques, deep breathing exercises, and mindfulness meditation strategies. ?Continue to receive twice weekly outpatient physical therapy with Margie Billet, Physical Therapist at  [facility], to improve strengthening, functioning, mobility, safety, balance, and ambulation techniques. ?Review list of [place] in [facility], mailed to your home by [employer] on

## 2021-12-26 DIAGNOSIS — J439 Emphysema, unspecified: Secondary | ICD-10-CM

## 2021-12-26 DIAGNOSIS — I5032 Chronic diastolic (congestive) heart failure: Secondary | ICD-10-CM

## 2021-12-26 DIAGNOSIS — I10 Essential (primary) hypertension: Secondary | ICD-10-CM | POA: Diagnosis not present

## 2021-12-26 DIAGNOSIS — I2510 Atherosclerotic heart disease of native coronary artery without angina pectoris: Secondary | ICD-10-CM

## 2021-12-26 DIAGNOSIS — E1165 Type 2 diabetes mellitus with hyperglycemia: Secondary | ICD-10-CM

## 2021-12-26 DIAGNOSIS — F321 Major depressive disorder, single episode, moderate: Secondary | ICD-10-CM

## 2021-12-26 DIAGNOSIS — Z9861 Coronary angioplasty status: Secondary | ICD-10-CM

## 2021-12-26 DIAGNOSIS — I482 Chronic atrial fibrillation, unspecified: Secondary | ICD-10-CM | POA: Diagnosis not present

## 2021-12-26 DIAGNOSIS — E785 Hyperlipidemia, unspecified: Secondary | ICD-10-CM

## 2021-12-28 ENCOUNTER — Other Ambulatory Visit (HOSPITAL_BASED_OUTPATIENT_CLINIC_OR_DEPARTMENT_OTHER): Payer: Self-pay

## 2021-12-29 ENCOUNTER — Encounter (HOSPITAL_COMMUNITY): Payer: Self-pay

## 2021-12-29 ENCOUNTER — Ambulatory Visit (HOSPITAL_COMMUNITY): Payer: Medicare Other | Attending: Physical Medicine & Rehabilitation

## 2021-12-29 DIAGNOSIS — M25551 Pain in right hip: Secondary | ICD-10-CM | POA: Diagnosis not present

## 2021-12-29 DIAGNOSIS — R2689 Other abnormalities of gait and mobility: Secondary | ICD-10-CM | POA: Diagnosis not present

## 2021-12-29 DIAGNOSIS — R5381 Other malaise: Secondary | ICD-10-CM | POA: Diagnosis not present

## 2021-12-29 DIAGNOSIS — R29898 Other symptoms and signs involving the musculoskeletal system: Secondary | ICD-10-CM | POA: Diagnosis not present

## 2021-12-29 DIAGNOSIS — M6281 Muscle weakness (generalized): Secondary | ICD-10-CM | POA: Insufficient documentation

## 2021-12-29 NOTE — Therapy (Addendum)
?OUTPATIENT PHYSICAL THERAPY TREATMENT NOTE ? ? ?Patient Name: Shaun [name] ?MRN: [id number] ?DOB:[birth year], 66 y.o., male ?Today's Date: 12/29/2021 ? ?PCP: Maximiano Coss, NP ?REFERRING PROVIDER: Charlett Blake, MD ? ? ?END OF SESSION:  ? PT End of Session - 12/29/21 0915   ? ? Visit Number 13   ? Number of Visits 24   ? Date for PT Re-Evaluation 01/25/22   ? Authorization Type [employer] Medicare   ? Progress Note Due on Visit 20   ? PT Start Time 0830   ? PT Stop Time 0814   ? PT Time Calculation (min) 42 min   ? Activity Tolerance Patient tolerated treatment well   ? Behavior During Therapy [facility] for tasks assessed/performed   ? ?  ?  ? ?  ? ? ? ? ? ? ?Past Medical History:  ?Diagnosis Date  ? Anemia   ? Anxiety   ? Asthma   ? CAD (coronary artery disease)   ? DES to distal circumflex 2016  ? Cataract   ? Colon polyps   ? 30 colon polyps found on first colonoscopy  ? Diastolic heart failure ([place])   ? Diverticulitis   ? DJD (degenerative joint disease)   ? GERD (gastroesophageal reflux disease)   ? History of kidney stones   ? Hyperlipidemia   ? Hypertension   ? Insomnia   ? Obstructive sleep apnea 12/2009  ? 01/26/2010 AHI 83/hr  ? Permanent atrial fibrillation ([place])   ? Onset 2006 paroxysmal then progressive to persistent  ? PUD (peptic ulcer disease)   ? 1980s  ? RLS (restless legs syndrome)   ? Sinusitis   ? Skin cancer   ? Type 2 diabetes mellitus ([place])   ? ?Past Surgical History:  ?Procedure Laterality Date  ? BIOPSY  07/17/2018  ? Procedure: BIOPSY;  Surgeon: Danie Binder, MD;  [facility];  Service: Endoscopy;;  colon  ? BOWEL RESECTION  09/17/2018  ? SMALL BOWEL RESECTION: 71 CM   ? CARDIAC CATHETERIZATION N/A 07/21/2015  ? Procedure: Left Heart Cath and Coronary Angiography;  Surgeon: Peter M Martinique, MD;  Location: [place] [facility];  Service: Cardiovascular;  Laterality: N/A;  ? CARDIAC CATHETERIZATION N/A 07/21/2015  ? Procedure: Coronary Stent Intervention;  Surgeon: Peter M Martinique,  MD;  Location: [place] [facility];  Service: Cardiovascular;  Laterality: N/A;  ? CIRCUMCISION N/A 04/05/2019  ? Procedure: CIRCUMCISION ADULT;  Surgeon: Irine Seal, MD;  Location: [facility];  Service: Urology;  Laterality: N/A;  ? COLONOSCOPY N/A 05/19/2014  ? Dr. Barnie Alderman diverticulosis/moderate external hemorrhoids, >20 simple adenomas. Genetic screening negative.   ? COLONOSCOPY WITH PROPOFOL N/A 07/17/2018  ? Dr. Oneida Alar: Diverticulosis, external/internal hemorrhoids, 32 colon polyps removed.  ten tubular adenomas removed with no high-grade dysplasia.  Advised to have surveillance colonoscopy in 3 years.  ? COLONOSCOPY WITH PROPOFOL N/A 06/07/2021  ? Procedure: COLONOSCOPY WITH PROPOFOL;  Surgeon: Eloise Harman, DO;  [facility];  Service: Endoscopy;  Laterality: N/A;  9:30 / ASA 3  (Pt was told that his time will be given at Pre-op)  ? ESOPHAGOGASTRODUODENOSCOPY (EGD) WITH PROPOFOL N/A 07/17/2018  ? Dr. Oneida Alar: Low-grade narrowing Schatzki ring at the GE junction status post dilation.  Gastritis.  Biopsy with mild nonspecific reactive gastropathy.  No H. pylori.  ? GIVENS CAPSULE STUDY N/A 06/24/2019  ? normal  ? HERNIA REPAIR  1986  ? Left inguinal  ? INTRAVASCULAR PRESSURE WIRE/FFR STUDY Left 06/08/2017  ?  Procedure: INTRAVASCULAR PRESSURE WIRE/FFR STUDY;  Surgeon: Nelva Bush, MD;  Location: [place] [facility];  Service: Cardiovascular;  Laterality: Left;  LAD and CFX  ? LAPAROTOMY N/A 09/17/2018  ? Procedure: EXPLORATORY LAPAROTOMY;  Surgeon: Virl Cagey, MD;  [facility]: [facility];  Service: General;  Laterality: N/A;  ? LEFT HEART CATH AND CORONARY ANGIOGRAPHY N/A 06/08/2017  ? Procedure: LEFT HEART CATH AND CORONARY ANGIOGRAPHY;  Surgeon: Nelva Bush, MD;  Location: [place] [facility];  Service: Cardiovascular;  Laterality: N/A;  ? POLYPECTOMY  07/17/2018  ? Procedure: POLYPECTOMY;  Surgeon: Danie Binder, MD;  [facility];  Service: Endoscopy;;  colon  ?  POLYPECTOMY  06/07/2021  ? Procedure: POLYPECTOMY INTESTINAL;  Surgeon: Eloise Harman, DO;  [facility];  Service: Endoscopy;;  ? ROTATOR CUFF REPAIR    ? Right  ? SAVORY DILATION N/A 07/17/2018  ? Procedure: SAVORY DILATION;  Surgeon: Danie Binder, MD;  [facility];  Service: Endoscopy;  Laterality: N/A;  ? ?Patient Active Problem List  ? Diagnosis Date Noted  ? Debility 10/21/2021  ? Acute respiratory failure ([place]) 08/25/2021  ? Sundowning 08/23/2021  ? Dysphagia 08/22/2021  ? Hypernatremia 08/22/2021  ? Obesity (BMI 30-39.9) 08/21/2021  ? Endotracheally intubated   ? Influenza   ? Acute respiratory failure with hypoxemia ([place]) 08/06/2021  ? Ventral hernia 12/31/2020  ? Nausea with vomiting 12/31/2019  ? Dyslipidemia, goal LDL below 70 06/18/2019  ? Hx of adenomatous colonic polyps 10/17/2018  ? Anemia 10/17/2018  ? Phimosis of penis 09/27/2018  ? Primary osteoarthritis of right hip 06/20/2018  ? Esophageal dysphagia 05/21/2018  ? GERD (gastroesophageal reflux disease) 05/21/2018  ? RLS (restless legs syndrome)   ? PUD (peptic ulcer disease)   ? Insomnia   ? Chronic diastolic CHF (congestive heart failure) ([place])   ? Asthma   ? Arteriosclerotic cardiovascular disease (ASCVD)   ? Chronic anticoagulation 01/27/2017  ? Depression 09/24/2015  ? COPD (chronic obstructive pulmonary disease) ([place]) 07/20/2015  ? Accelerating angina ([place]) 07/19/2015  ? Genetic testing 09/09/2014  ? H/O adenomatous polyp of colon 08/11/2014  ? Non-insulin treated type 2 diabetes mellitus ([place]) 02/09/2011  ? CAD S/P percutaneous coronary angioplasty   ? Tobacco abuse   ? Essential hypertension   ? Chronic atrial fibrillation ([place]) 02/23/2010  ? Obstructive sleep apnea 12/27/2009  ? ? ?REFERRING DIAG: M16.11 (ICD-10-CM) - Primary osteoarthritis of right hip R53.81 (ICD-10-CM) - Debility   ? ?THERAPY DIAG:  ?No diagnosis found. ? ?PERTINENT HISTORY: Pneumonia 12/22, afib, COPD, CHF, DM, anxiety,  depression ? ?PRECAUTIONS: Fall ? ?SUBJECTIVE: Pt stated newest exercise seem to be helpful.  Reports his wife noticed improvements with his gait at a craft market over weekend.  No reports of pain today, is a little sore.   ? ?PAIN:  ?Are you having pain? No ? ?Pain description: sore ?Aggravating factors: movement, sleep position ?Relieving factors: recliner ? ? ? ?OBJECTIVE:  ?  ?Below in italics are FROM EVALUATION =   ? PATIENT SURVEYS:  ?FOTO 42% function; 12/14/21 57% function ?  ?OBSERVATION:  Ambulates without AD, unsteady with wide BOS, antalgic on L  ?  ?POSTURE:  ?Frequently slouched with lateral lean in seated  ?  ?PALPATION: ?TTP R glute med/min and max ?  ?LE ROM: decreased R hip abd and extension > L  ?  ?Active ROM Right ?11/11/2021 Left ?11/11/2021  ?Hip flexion      ?Hip extension      ?  Hip abduction      ?Hip adduction      ?Hip internal rotation      ?Hip external rotation      ?Knee flexion      ?Knee extension      ?Ankle dorsiflexion      ?Ankle plantarflexion      ?Ankle inversion      ?Ankle eversion      ? (Blank rows = not tested) ?  ?LE MMT: extension not tested as patient unable to lay prone ?  ?MMT Right ?11/11/2021 Left ?11/11/2021 Right ?12/14/21 Left  ?12/14/21  ?Hip flexion 4/5 4+/5 5/5 5/5  ?Hip extension        ?Hip abduction 3/5 3/5 3+/5 4-/5  ?Hip adduction        ?Hip internal rotation        ?Hip external rotation        ?Knee flexion 4+/5 5/5 5/5 5/5  ?Knee extension 4+/5 5/5 5/5 5/5  ?Ankle dorsiflexion 5/5 5/5    ?Ankle plantarflexion        ?Ankle inversion        ?Ankle eversion        ? (Blank rows = not tested) ?  ?  ?  ?FUNCTIONAL TESTS:  ?5 times sit to stand: 13.15 with heavy bilateral UE support ?2 minute walk test: 345 feet ?Transfers: Bilateral UE use, labored ? ?12/14/21 ?5 times sit to stand: 13.97 seconds without UE supports ?2 minute walk test: 395 feet ?Transfers: without UE support ?  ?GAIT: ?Distance walked: 395 feet ?Assistive device utilized: None ?Level of  assistance: Modified independence ?Comments: 2MWT, antalgic, decreased R hip extension, slight trunk flexion for limited r hip extension, antalgic ?  ?  ?  ?TODAY'S TREATMENT: ?12/29/21: ?3D hip excursion 15x ?RTB shoulder extension and

## 2021-12-31 ENCOUNTER — Ambulatory Visit (HOSPITAL_COMMUNITY): Payer: Medicare Other

## 2021-12-31 ENCOUNTER — Other Ambulatory Visit (HOSPITAL_BASED_OUTPATIENT_CLINIC_OR_DEPARTMENT_OTHER): Payer: Self-pay

## 2021-12-31 ENCOUNTER — Encounter (HOSPITAL_COMMUNITY): Payer: Self-pay

## 2021-12-31 DIAGNOSIS — R2689 Other abnormalities of gait and mobility: Secondary | ICD-10-CM | POA: Diagnosis not present

## 2021-12-31 DIAGNOSIS — M25551 Pain in right hip: Secondary | ICD-10-CM | POA: Diagnosis not present

## 2021-12-31 DIAGNOSIS — M6281 Muscle weakness (generalized): Secondary | ICD-10-CM | POA: Diagnosis not present

## 2021-12-31 DIAGNOSIS — R5381 Other malaise: Secondary | ICD-10-CM

## 2021-12-31 DIAGNOSIS — R29898 Other symptoms and signs involving the musculoskeletal system: Secondary | ICD-10-CM

## 2021-12-31 NOTE — Therapy (Signed)
?OUTPATIENT PHYSICAL THERAPY TREATMENT NOTE ? ? ?Patient Name: Shaun [name] ?MRN: [id number] ?DOB:June 01, 1956, 66 y.o., male ?Today's Date: 12/31/2021 ? ?PCP: Maximiano Coss, NP ?REFERRING PROVIDER: Charlett Blake, MD ? ? ? PT End of Session - 12/31/21 0912   ? ? Visit Number 14   ? Number of Visits 24   ? Date for PT Re-Evaluation 01/25/22   ? Progress Note Due on Visit 20   ? PT Start Time [phone]   ? PT Stop Time 7989   ? PT Time Calculation (min) 46 min   ? Activity Tolerance Patient tolerated treatment well   ? Behavior During Therapy [facility] for tasks assessed/performed   ? ?  ?  ? ?  ?END OF SESSION:  ? PT End of Session - 12/31/21 0912   ? ? Visit Number 14   ? Number of Visits 24   ? Date for PT Re-Evaluation 01/25/22   ? Progress Note Due on Visit 20   ? PT Start Time [phone]   ? PT Stop Time [id number]   ? PT Time Calculation (min) 46 min   ? Activity Tolerance Patient tolerated treatment well   ? Behavior During Therapy [facility] for tasks assessed/performed   ? ?  ?  ? ?  ? ? ? ? ? ? ? ?Past Medical History:  ?Diagnosis Date  ? Anemia   ? Anxiety   ? Asthma   ? CAD (coronary artery disease)   ? DES to distal circumflex 2016  ? Cataract   ? Colon polyps   ? 30 colon polyps found on first colonoscopy  ? Diastolic heart failure ([place])   ? Diverticulitis   ? DJD (degenerative joint disease)   ? GERD (gastroesophageal reflux disease)   ? History of kidney stones   ? Hyperlipidemia   ? Hypertension   ? Insomnia   ? Obstructive sleep apnea 12/2009  ? 01/26/2010 AHI 83/hr  ? Permanent atrial fibrillation ([place])   ? Onset 2006 paroxysmal then progressive to persistent  ? PUD (peptic ulcer disease)   ? 1980s  ? RLS (restless legs syndrome)   ? Sinusitis   ? Skin cancer   ? Type 2 diabetes mellitus ([place])   ? ?Past Surgical History:  ?Procedure Laterality Date  ? BIOPSY  07/17/2018  ? Procedure: BIOPSY;  Surgeon: Danie Binder, MD;  [facility];  Service: Endoscopy;;  colon  ? BOWEL RESECTION  09/17/2018  ? SMALL  BOWEL RESECTION: 71 CM   ? CARDIAC CATHETERIZATION N/A 07/21/2015  ? Procedure: Left Heart Cath and Coronary Angiography;  Surgeon: Peter M Martinique, MD;  Location: [place] [facility];  Service: Cardiovascular;  Laterality: N/A;  ? CARDIAC CATHETERIZATION N/A 07/21/2015  ? Procedure: Coronary Stent Intervention;  Surgeon: Peter M Martinique, MD;  Location: [place] [facility];  Service: Cardiovascular;  Laterality: N/A;  ? CIRCUMCISION N/A 04/05/2019  ? Procedure: CIRCUMCISION ADULT;  Surgeon: Irine Seal, MD;  [facility]: [facility];  Service: Urology;  Laterality: N/A;  ? COLONOSCOPY N/A 05/19/2014  ? Dr. Barnie Alderman diverticulosis/moderate external hemorrhoids, >20 simple adenomas. Genetic screening negative.   ? COLONOSCOPY WITH PROPOFOL N/A 07/17/2018  ? Dr. Oneida Alar: Diverticulosis, external/internal hemorrhoids, 32 colon polyps removed.  ten tubular adenomas removed with no high-grade dysplasia.  Advised to have surveillance colonoscopy in 3 years.  ? COLONOSCOPY WITH PROPOFOL N/A 06/07/2021  ? Procedure: COLONOSCOPY WITH PROPOFOL;  Surgeon: Eloise Harman, DO;  [facility];  Service:  Endoscopy;  Laterality: N/A;  9:30 / ASA 3  (Pt was told that his time will be given at Pre-op)  ? ESOPHAGOGASTRODUODENOSCOPY (EGD) WITH PROPOFOL N/A 07/17/2018  ? Dr. Oneida Alar: Low-grade narrowing Schatzki ring at the GE junction status post dilation.  Gastritis.  Biopsy with mild nonspecific reactive gastropathy.  No H. pylori.  ? GIVENS CAPSULE STUDY N/A 06/24/2019  ? normal  ? HERNIA REPAIR  1986  ? Left inguinal  ? INTRAVASCULAR PRESSURE WIRE/FFR STUDY Left 06/08/2017  ? Procedure: INTRAVASCULAR PRESSURE WIRE/FFR STUDY;  Surgeon: Nelva Bush, MD;  Location: [place] [facility];  Service: Cardiovascular;  Laterality: Left;  LAD and CFX  ? LAPAROTOMY N/A 09/17/2018  ? Procedure: EXPLORATORY LAPAROTOMY;  Surgeon: Virl Cagey, MD;  [facility];  Service: General;  Laterality: N/A;  ? LEFT HEART CATH AND CORONARY  ANGIOGRAPHY N/A 06/08/2017  ? Procedure: LEFT HEART CATH AND CORONARY ANGIOGRAPHY;  Surgeon: Nelva Bush, MD;  Location: [place] [facility];  Service: Cardiovascular;  Laterality: N/A;  ? POLYPECTOMY  07/17/2018  ? Procedure: POLYPECTOMY;  Surgeon: Danie Binder, MD;  [facility];  Service: Endoscopy;;  colon  ? POLYPECTOMY  06/07/2021  ? Procedure: POLYPECTOMY INTESTINAL;  Surgeon: Eloise Harman, DO;  [facility];  Service: Endoscopy;;  ? ROTATOR CUFF REPAIR    ? Right  ? SAVORY DILATION N/A 07/17/2018  ? Procedure: SAVORY DILATION;  Surgeon: Danie Binder, MD;  [facility];  Service: Endoscopy;  Laterality: N/A;  ? ?Patient Active Problem List  ? Diagnosis Date Noted  ? Debility 10/21/2021  ? Acute respiratory failure ([place]) 08/25/2021  ? Sundowning 08/23/2021  ? Dysphagia 08/22/2021  ? Hypernatremia 08/22/2021  ? Obesity (BMI 30-39.9) 08/21/2021  ? Endotracheally intubated   ? Influenza   ? Acute respiratory failure with hypoxemia ([place]) 08/06/2021  ? Ventral hernia 12/31/2020  ? Nausea with vomiting 12/31/2019  ? Dyslipidemia, goal LDL below 70 06/18/2019  ? Hx of adenomatous colonic polyps 10/17/2018  ? Anemia 10/17/2018  ? Phimosis of penis 09/27/2018  ? Primary osteoarthritis of right hip 06/20/2018  ? Esophageal dysphagia 05/21/2018  ? GERD (gastroesophageal reflux disease) 05/21/2018  ? RLS (restless legs syndrome)   ? PUD (peptic ulcer disease)   ? Insomnia   ? Chronic diastolic CHF (congestive heart failure) ([place])   ? Asthma   ? Arteriosclerotic cardiovascular disease (ASCVD)   ? Chronic anticoagulation 01/27/2017  ? Depression 09/24/2015  ? COPD (chronic obstructive pulmonary disease) ([place]) 07/20/2015  ? Accelerating angina ([place]) 07/19/2015  ? Genetic testing 09/09/2014  ? H/O adenomatous polyp of colon 08/11/2014  ? Non-insulin treated type 2 diabetes mellitus ([place]) 02/09/2011  ? CAD S/P percutaneous coronary angioplasty   ? Tobacco abuse   ? Essential  hypertension   ? Chronic atrial fibrillation ([place]) 02/23/2010  ? Obstructive sleep apnea 12/27/2009  ? ? ?REFERRING DIAG: M16.11 (ICD-10-CM) - Primary osteoarthritis of right hip R53.81 (ICD-10-CM) - Debility   ? ?THERAPY DIAG:  ?Pain in right hip ? ?Debility ? ?Muscle weakness (generalized) ? ?Other abnormalities of gait and mobility ? ?Other symptoms and signs involving the musculoskeletal system ? ?PERTINENT HISTORY: Pneumonia 12/22, afib, COPD, CHF, DM, anxiety, depression ? ?PRECAUTIONS: Fall ? ?SUBJECTIVE: Pt reports he is feeling good with HEPs.  No reports of pain today. ? ?PAIN:  ?Are you having pain? No ? ?Pain description: sore ?Aggravating factors: movement, sleep position ?Relieving factors: recliner ? ? ? ?OBJECTIVE:  ?  ?  Below in italics are FROM EVALUATION =   ? PATIENT SURVEYS:  ?FOTO 42% function; 12/14/21 57% function ?  ?OBSERVATION:  Ambulates without AD, unsteady with wide BOS, antalgic on L  ?  ?POSTURE:  ?Frequently slouched with lateral lean in seated  ?  ?PALPATION: ?TTP R glute med/min and max ?  ?LE ROM: decreased R hip abd and extension > L  ?  ?Active ROM Right ?11/11/2021 Left ?11/11/2021  ?Hip flexion      ?Hip extension      ?Hip abduction      ?Hip adduction      ?Hip internal rotation      ?Hip external rotation      ?Knee flexion      ?Knee extension      ?Ankle dorsiflexion      ?Ankle plantarflexion      ?Ankle inversion      ?Ankle eversion      ? (Blank rows = not tested) ?  ?LE MMT: extension not tested as patient unable to lay prone ?  ?MMT Right ?11/11/2021 Left ?11/11/2021 Right ?12/14/21 Left  ?12/14/21  ?Hip flexion 4/5 4+/5 5/5 5/5  ?Hip extension        ?Hip abduction 3/5 3/5 3+/5 4-/5  ?Hip adduction        ?Hip internal rotation        ?Hip external rotation        ?Knee flexion 4+/5 5/5 5/5 5/5  ?Knee extension 4+/5 5/5 5/5 5/5  ?Ankle dorsiflexion 5/5 5/5    ?Ankle plantarflexion        ?Ankle inversion        ?Ankle eversion        ? (Blank rows = not tested) ?  ?  ?   ?FUNCTIONAL TESTS:  ?5 times sit to stand: 13.15 with heavy bilateral UE support ?2 minute walk test: 345 feet ?Transfers: Bilateral UE use, labored ? ?12/14/21 ?5 times sit to stand: 13.97 seconds without UE support

## 2022-01-03 ENCOUNTER — Other Ambulatory Visit (HOSPITAL_BASED_OUTPATIENT_CLINIC_OR_DEPARTMENT_OTHER): Payer: Self-pay

## 2022-01-04 ENCOUNTER — Encounter (HOSPITAL_COMMUNITY): Payer: Self-pay | Admitting: Physical Therapy

## 2022-01-04 ENCOUNTER — Ambulatory Visit (HOSPITAL_COMMUNITY): Payer: Medicare Other | Admitting: Physical Therapy

## 2022-01-04 DIAGNOSIS — M6281 Muscle weakness (generalized): Secondary | ICD-10-CM | POA: Diagnosis not present

## 2022-01-04 DIAGNOSIS — M25551 Pain in right hip: Secondary | ICD-10-CM | POA: Diagnosis not present

## 2022-01-04 DIAGNOSIS — R5381 Other malaise: Secondary | ICD-10-CM | POA: Diagnosis not present

## 2022-01-04 DIAGNOSIS — R2689 Other abnormalities of gait and mobility: Secondary | ICD-10-CM | POA: Diagnosis not present

## 2022-01-04 DIAGNOSIS — R29898 Other symptoms and signs involving the musculoskeletal system: Secondary | ICD-10-CM | POA: Diagnosis not present

## 2022-01-04 NOTE — Therapy (Signed)
?OUTPATIENT PHYSICAL THERAPY TREATMENT NOTE ? ? ?Patient Name: Shaun [name] ?MRN: [id number] ?DOB:11/19/1955, 66 y.o., male ?Today's Date: 01/04/2022 ? ?PCP: Maximiano Coss, NP ?REFERRING PROVIDER: Charlett Blake, MD ? ? ? PT End of Session - 01/04/22 0745   ? ? Visit Number 15   ? Number of Visits 24   ? Date for PT Re-Evaluation 01/25/22   ? Authorization Type [employer] Medicare   ? Progress Note Due on Visit 20   ? PT Start Time [phone]   ? PT Stop Time 0830   ? PT Time Calculation (min) 43 min   ? Activity Tolerance Patient tolerated treatment well   ? Behavior During Therapy [facility] for tasks assessed/performed   ? ?  ?  ? ?  ?END OF SESSION:  ? PT End of Session - 01/04/22 0745   ? ? Visit Number 15   ? Number of Visits 24   ? Date for PT Re-Evaluation 01/25/22   ? Authorization Type [employer] Medicare   ? Progress Note Due on Visit 20   ? PT Start Time [phone]   ? PT Stop Time 0830   ? PT Time Calculation (min) 43 min   ? Activity Tolerance Patient tolerated treatment well   ? Behavior During Therapy [facility] for tasks assessed/performed   ? ?  ?  ? ?  ? ? ? ? ? ? ? ?Past Medical History:  ?Diagnosis Date  ? Anemia   ? Anxiety   ? Asthma   ? CAD (coronary artery disease)   ? DES to distal circumflex 2016  ? Cataract   ? Colon polyps   ? 30 colon polyps found on first colonoscopy  ? Diastolic heart failure ([place])   ? Diverticulitis   ? DJD (degenerative joint disease)   ? GERD (gastroesophageal reflux disease)   ? History of kidney stones   ? Hyperlipidemia   ? Hypertension   ? Insomnia   ? Obstructive sleep apnea 12/2009  ? 01/26/2010 AHI 83/hr  ? Permanent atrial fibrillation ([place])   ? Onset 2006 paroxysmal then progressive to persistent  ? PUD (peptic ulcer disease)   ? 1980s  ? RLS (restless legs syndrome)   ? Sinusitis   ? Skin cancer   ? Type 2 diabetes mellitus ([place])   ? ?Past Surgical History:  ?Procedure Laterality Date  ? BIOPSY  07/17/2018  ? Procedure: BIOPSY;  Surgeon: Danie Binder, MD;  [facility];   Service: Endoscopy;;  colon  ? BOWEL RESECTION  09/17/2018  ? SMALL BOWEL RESECTION: 71 CM   ? CARDIAC CATHETERIZATION N/A 07/21/2015  ? Procedure: Left Heart Cath and Coronary Angiography;  Surgeon: Peter M Martinique, MD;  Location: [place] [facility];  Service: Cardiovascular;  Laterality: N/A;  ? CARDIAC CATHETERIZATION N/A 07/21/2015  ? Procedure: Coronary Stent Intervention;  Surgeon: Peter M Martinique, MD;  Location: [place] [facility];  Service: Cardiovascular;  Laterality: N/A;  ? CIRCUMCISION N/A 04/05/2019  ? Procedure: CIRCUMCISION ADULT;  Surgeon: Irine Seal, MD;  [facility];  Service: Urology;  Laterality: N/A;  ? COLONOSCOPY N/A 05/19/2014  ? Dr. Barnie Alderman diverticulosis/moderate external hemorrhoids, >20 simple adenomas. Genetic screening negative.   ? COLONOSCOPY WITH PROPOFOL N/A 07/17/2018  ? Dr. Oneida Alar: Diverticulosis, external/internal hemorrhoids, 32 colon polyps removed.  ten tubular adenomas removed with no high-grade dysplasia.  Advised to have surveillance colonoscopy in 3 years.  ? COLONOSCOPY WITH PROPOFOL N/A 06/07/2021  ? Procedure: COLONOSCOPY WITH  PROPOFOL;  Surgeon: Eloise Harman, DO;  [facility];  Service: Endoscopy;  Laterality: N/A;  9:30 / ASA 3  (Pt was told that his time will be given at Pre-op)  ? ESOPHAGOGASTRODUODENOSCOPY (EGD) WITH PROPOFOL N/A 07/17/2018  ? Dr. Oneida Alar: Low-grade narrowing Schatzki ring at the GE junction status post dilation.  Gastritis.  Biopsy with mild nonspecific reactive gastropathy.  No H. pylori.  ? GIVENS CAPSULE STUDY N/A 06/24/2019  ? normal  ? HERNIA REPAIR  1986  ? Left inguinal  ? INTRAVASCULAR PRESSURE WIRE/FFR STUDY Left 06/08/2017  ? Procedure: INTRAVASCULAR PRESSURE WIRE/FFR STUDY;  Surgeon: Nelva Bush, MD;  Location: [place] [facility];  Service: Cardiovascular;  Laterality: Left;  LAD and CFX  ? LAPAROTOMY N/A 09/17/2018  ? Procedure: EXPLORATORY LAPAROTOMY;  Surgeon: Virl Cagey, MD;  [facility];   Service: General;  Laterality: N/A;  ? LEFT HEART CATH AND CORONARY ANGIOGRAPHY N/A 06/08/2017  ? Procedure: LEFT HEART CATH AND CORONARY ANGIOGRAPHY;  Surgeon: Nelva Bush, MD;  Location: [place] [facility];  Service: Cardiovascular;  Laterality: N/A;  ? POLYPECTOMY  07/17/2018  ? Procedure: POLYPECTOMY;  Surgeon: Danie Binder, MD;  [facility];  Service: Endoscopy;;  colon  ? POLYPECTOMY  06/07/2021  ? Procedure: POLYPECTOMY INTESTINAL;  Surgeon: Eloise Harman, DO;  [facility];  Service: Endoscopy;;  ? ROTATOR CUFF REPAIR    ? Right  ? SAVORY DILATION N/A 07/17/2018  ? Procedure: SAVORY DILATION;  Surgeon: Danie Binder, MD;  [facility];  Service: Endoscopy;  Laterality: N/A;  ? ?Patient Active Problem List  ? Diagnosis Date Noted  ? Debility 10/21/2021  ? Acute respiratory failure ([place]) 08/25/2021  ? Sundowning 08/23/2021  ? Dysphagia 08/22/2021  ? Hypernatremia 08/22/2021  ? Obesity (BMI 30-39.9) 08/21/2021  ? Endotracheally intubated   ? Influenza   ? Acute respiratory failure with hypoxemia ([place]) 08/06/2021  ? Ventral hernia 12/31/2020  ? Nausea with vomiting 12/31/2019  ? Dyslipidemia, goal LDL below 70 06/18/2019  ? Hx of adenomatous colonic polyps 10/17/2018  ? Anemia 10/17/2018  ? Phimosis of penis 09/27/2018  ? Primary osteoarthritis of right hip 06/20/2018  ? Esophageal dysphagia 05/21/2018  ? GERD (gastroesophageal reflux disease) 05/21/2018  ? RLS (restless legs syndrome)   ? PUD (peptic ulcer disease)   ? Insomnia   ? Chronic diastolic CHF (congestive heart failure) ([place])   ? Asthma   ? Arteriosclerotic cardiovascular disease (ASCVD)   ? Chronic anticoagulation 01/27/2017  ? Depression 09/24/2015  ? COPD (chronic obstructive pulmonary disease) ([place]) 07/20/2015  ? Accelerating angina ([place]) 07/19/2015  ? Genetic testing 09/09/2014  ? H/O adenomatous polyp of colon 08/11/2014  ? Non-insulin treated type 2 diabetes mellitus ([place]) 02/09/2011  ? CAD S/P  percutaneous coronary angioplasty   ? Tobacco abuse   ? Essential hypertension   ? Chronic atrial fibrillation ([place]) 02/23/2010  ? Obstructive sleep apnea 12/27/2009  ? ? ?REFERRING DIAG: M16.11 (ICD-10-CM) - Primary osteoarthritis of right hip R53.81 (ICD-10-CM) - Debility   ? ?THERAPY DIAG:  ?Pain in right hip ? ?Muscle weakness (generalized) ? ?Debility ? ?Other abnormalities of gait and mobility ? ?Other symptoms and signs involving the musculoskeletal system ? ?PERTINENT HISTORY: Pneumonia 12/22, afib, COPD, CHF, DM, anxiety, depression ? ?PRECAUTIONS: Fall ? ?SUBJECTIVE: Pt reports he is feeling good with HEPs.  No reports of hip pain today. Reports back is a little sore, has been experiencing this  soreness for a long time.  ? ?PAIN:  ?Are you having pain? None in hip, back soreness ? ?Pain description: sore ?Aggravating factors: movement, sleep position ?Relieving factors: recliner ? ? ? ?OBJECTIVE:  ?  ?Below in italics are FROM EVALUATION =   ? PATIENT SURVEYS:  ?FOTO 42% function; 12/14/21 57% function ?  ?OBSERVATION:  Ambulates without AD, unsteady with wide BOS, antalgic on L  ?  ?POSTURE:  ?Frequently slouched with lateral lean in seated  ?  ?PALPATION: ?TTP R glute med/min and max ?  ?LE ROM: decreased R hip abd and extension > L  ?  ?Active ROM Right ?11/11/2021 Left ?11/11/2021  ?Hip flexion      ?Hip extension      ?Hip abduction      ?Hip adduction      ?Hip internal rotation      ?Hip external rotation      ?Knee flexion      ?Knee extension      ?Ankle dorsiflexion      ?Ankle plantarflexion      ?Ankle inversion      ?Ankle eversion      ? (Blank rows = not tested) ?  ?LE MMT: extension not tested as patient unable to lay prone ?  ?MMT Right ?11/11/2021 Left ?11/11/2021 Right ?12/14/21 Left  ?12/14/21  ?Hip flexion 4/5 4+/5 5/5 5/5  ?Hip extension        ?Hip abduction 3/5 3/5 3+/5 4-/5  ?Hip adduction        ?Hip internal rotation        ?Hip external rotation        ?Knee flexion 4+/5 5/5 5/5 5/5   ?Knee extension 4+/5 5/5 5/5 5/5  ?Ankle dorsiflexion 5/5 5/5    ?Ankle plantarflexion        ?Ankle inversion        ?Ankle eversion        ? (Blank rows = not tested) ?  ?  ?  ?FUNCTIONAL TESTS:  ?5 times s

## 2022-01-06 ENCOUNTER — Encounter (HOSPITAL_COMMUNITY): Payer: Self-pay

## 2022-01-06 ENCOUNTER — Telehealth: Payer: Medicare Other | Admitting: *Deleted

## 2022-01-06 ENCOUNTER — Ambulatory Visit (HOSPITAL_COMMUNITY): Payer: Medicare Other

## 2022-01-06 DIAGNOSIS — M25551 Pain in right hip: Secondary | ICD-10-CM | POA: Diagnosis not present

## 2022-01-06 DIAGNOSIS — R2689 Other abnormalities of gait and mobility: Secondary | ICD-10-CM

## 2022-01-06 DIAGNOSIS — R29898 Other symptoms and signs involving the musculoskeletal system: Secondary | ICD-10-CM

## 2022-01-06 DIAGNOSIS — M6281 Muscle weakness (generalized): Secondary | ICD-10-CM

## 2022-01-06 DIAGNOSIS — R5381 Other malaise: Secondary | ICD-10-CM

## 2022-01-06 NOTE — Therapy (Signed)
?OUTPATIENT PHYSICAL THERAPY TREATMENT NOTE ? ? ?Patient Name: Shaun [name] ?MRN: [id number] ?DOB:11-Mar-1956, 66 y.o., male ?Today's Date: 01/06/2022 ? ?PCP: Maximiano Coss, NP ?REFERRING PROVIDER: Charlett Blake, MD ? ? ? PT End of Session - 01/06/22 0915   ? ? Visit Number 16   ? Number of Visits 24   ? Date for PT Re-Evaluation 01/25/22   ? Authorization Type UHC Medicare   ? Progress Note Due on Visit 20   ? PT Start Time 0831   ? PT Stop Time 4270   ? PT Time Calculation (min) 41 min   ? Activity Tolerance Patient tolerated treatment well   ? Behavior During Therapy [facility] for tasks assessed/performed   ? ?  ?  ? ?  ? ?END OF SESSION:  ? PT End of Session - 01/06/22 0915   ? ? Visit Number 16   ? Number of Visits 24   ? Date for PT Re-Evaluation 01/25/22   ? Authorization Type UHC Medicare   ? Progress Note Due on Visit 20   ? PT Start Time 0831   ? PT Stop Time 6237   ? PT Time Calculation (min) 41 min   ? Activity Tolerance Patient tolerated treatment well   ? Behavior During Therapy [facility] for tasks assessed/performed   ? ?  ?  ? ?  ? ? ? ? ? ? ? ? ?Past Medical History:  ?Diagnosis Date  ? Anemia   ? Anxiety   ? Asthma   ? CAD (coronary artery disease)   ? DES to distal circumflex 2016  ? Cataract   ? Colon polyps   ? 30 colon polyps found on first colonoscopy  ? Diastolic heart failure ([place])   ? Diverticulitis   ? DJD (degenerative joint disease)   ? GERD (gastroesophageal reflux disease)   ? History of kidney stones   ? Hyperlipidemia   ? Hypertension   ? Insomnia   ? Obstructive sleep apnea 12/2009  ? 01/26/2010 AHI 83/hr  ? Permanent atrial fibrillation ([place])   ? Onset 2006 paroxysmal then progressive to persistent  ? PUD (peptic ulcer disease)   ? 1980s  ? RLS (restless legs syndrome)   ? Sinusitis   ? Skin cancer   ? Type 2 diabetes mellitus ([place])   ? ?Past Surgical History:  ?Procedure Laterality Date  ? BIOPSY  07/17/2018  ? Procedure: BIOPSY;  Surgeon: Danie Binder, MD;  [facility];  Service: Endoscopy;;  colon  ? BOWEL RESECTION  09/17/2018  ? SMALL BOWEL RESECTION: 71 CM   ? CARDIAC CATHETERIZATION N/A 07/21/2015  ? Procedure: Left Heart Cath and Coronary Angiography;  Surgeon: Peter M Martinique, MD;  Location: [place] CV LAB;  Service: Cardiovascular;  Laterality: N/A;  ? CARDIAC CATHETERIZATION N/A 07/21/2015  ? Procedure: Coronary Stent Intervention;  Surgeon: Peter M Martinique, MD;  Location: [place] CV LAB;  Service: Cardiovascular;  Laterality: N/A;  ? CIRCUMCISION N/A 04/05/2019  ? Procedure: CIRCUMCISION ADULT;  Surgeon: Irine Seal, MD;  Location: [facility] ORS;  Service: Urology;  Laterality: N/A;  ? COLONOSCOPY N/A 05/19/2014  ? Dr. Barnie Alderman diverticulosis/moderate external hemorrhoids, >20 simple adenomas. Genetic screening negative.   ? COLONOSCOPY WITH PROPOFOL N/A 07/17/2018  ? Dr. Oneida Alar: Diverticulosis, external/internal hemorrhoids, 32 colon polyps removed.  ten tubular adenomas removed with no high-grade dysplasia.  Advised to have surveillance colonoscopy in 3 years.  ? COLONOSCOPY WITH PROPOFOL N/A 06/07/2021  ? Procedure:  COLONOSCOPY WITH PROPOFOL;  Surgeon: Eloise Harman, DO;  [facility];  Service: Endoscopy;  Laterality: N/A;  9:30 / ASA 3  (Pt was told that his time will be given at Pre-op)  ? ESOPHAGOGASTRODUODENOSCOPY (EGD) WITH PROPOFOL N/A 07/17/2018  ? Dr. Oneida Alar: Low-grade narrowing Schatzki ring at the GE junction status post dilation.  Gastritis.  Biopsy with mild nonspecific reactive gastropathy.  No H. pylori.  ? GIVENS CAPSULE STUDY N/A 06/24/2019  ? normal  ? HERNIA REPAIR  1986  ? Left inguinal  ? INTRAVASCULAR PRESSURE WIRE/FFR STUDY Left 06/08/2017  ? Procedure: INTRAVASCULAR PRESSURE WIRE/FFR STUDY;  Surgeon: Nelva Bush, MD;  Location: [place] CV LAB;  Service: Cardiovascular;  Laterality: Left;  LAD and CFX  ? LAPAROTOMY N/A 09/17/2018  ? Procedure: EXPLORATORY LAPAROTOMY;  Surgeon: Virl Cagey, MD;  Location: [facility];   Service: General;  Laterality: N/A;  ? LEFT HEART CATH AND CORONARY ANGIOGRAPHY N/A 06/08/2017  ? Procedure: LEFT HEART CATH AND CORONARY ANGIOGRAPHY;  Surgeon: Nelva Bush, MD;  Location: [place] CV LAB;  Service: Cardiovascular;  Laterality: N/A;  ? POLYPECTOMY  07/17/2018  ? Procedure: POLYPECTOMY;  Surgeon: Danie Binder, MD;  [facility];  Service: Endoscopy;;  colon  ? POLYPECTOMY  06/07/2021  ? Procedure: POLYPECTOMY INTESTINAL;  Surgeon: Eloise Harman, DO;  [facility];  Service: Endoscopy;;  ? ROTATOR CUFF REPAIR    ? Right  ? SAVORY DILATION N/A 07/17/2018  ? Procedure: SAVORY DILATION;  Surgeon: Danie Binder, MD;  [facility];  Service: Endoscopy;  Laterality: N/A;  ? ?Patient Active Problem List  ? Diagnosis Date Noted  ? Debility 10/21/2021  ? Acute respiratory failure ([place]) 08/25/2021  ? Sundowning 08/23/2021  ? Dysphagia 08/22/2021  ? Hypernatremia 08/22/2021  ? Obesity (BMI 30-39.9) 08/21/2021  ? Endotracheally intubated   ? Influenza   ? Acute respiratory failure with hypoxemia ([place]) 08/06/2021  ? Ventral hernia 12/31/2020  ? Nausea with vomiting 12/31/2019  ? Dyslipidemia, goal LDL below 70 06/18/2019  ? Hx of adenomatous colonic polyps 10/17/2018  ? Anemia 10/17/2018  ? Phimosis of penis 09/27/2018  ? Primary osteoarthritis of right hip 06/20/2018  ? Esophageal dysphagia 05/21/2018  ? GERD (gastroesophageal reflux disease) 05/21/2018  ? RLS (restless legs syndrome)   ? PUD (peptic ulcer disease)   ? Insomnia   ? Chronic diastolic CHF (congestive heart failure) ([place])   ? Asthma   ? Arteriosclerotic cardiovascular disease (ASCVD)   ? Chronic anticoagulation 01/27/2017  ? Depression 09/24/2015  ? COPD (chronic obstructive pulmonary disease) ([place]) 07/20/2015  ? Accelerating angina ([place]) 07/19/2015  ? Genetic testing 09/09/2014  ? H/O adenomatous polyp of colon 08/11/2014  ? Non-insulin treated type 2 diabetes mellitus ([place]) 02/09/2011  ? CAD S/P  percutaneous coronary angioplasty   ? Tobacco abuse   ? Essential hypertension   ? Chronic atrial fibrillation ([place]) 02/23/2010  ? Obstructive sleep apnea 12/27/2009  ? ? ?REFERRING DIAG: M16.11 (ICD-10-CM) - Primary osteoarthritis of right hip R53.81 (ICD-10-CM) - Debility   ? ?THERAPY DIAG:  ?Pain in right hip ? ?Muscle weakness (generalized) ? ?Debility ? ?Other abnormalities of gait and mobility ? ?Other symptoms and signs involving the musculoskeletal system ? ?PERTINENT HISTORY: Pneumonia 12/22, afib, COPD, CHF, DM, anxiety, depression ? ?PRECAUTIONS: Fall ? ?SUBJECTIVE: Pt stated it's been at least a week since he has had any hip pain.  Continues to have some soreness in back. ? ?  PAIN:  ?Are you having pain? None in hip, back soreness ? ?Pain description: sore ?Aggravating factors: movement, sleep position ?Relieving factors: recliner ? ? ? ?OBJECTIVE:  ?  ?Below in italics are FROM EVALUATION =   ? PATIENT SURVEYS:  ?FOTO 42% function; 12/14/21 57% function ?  ?OBSERVATION:  Ambulates without AD, unsteady with wide BOS, antalgic on L  ?  ?POSTURE:  ?Frequently slouched with lateral lean in seated  ?  ?PALPATION: ?TTP R glute med/min and max ?  ?LE ROM: decreased R hip abd and extension > L  ?  ?Active ROM Right ?11/11/2021 Left ?11/11/2021  ?Hip flexion      ?Hip extension      ?Hip abduction      ?Hip adduction      ?Hip internal rotation      ?Hip external rotation      ?Knee flexion      ?Knee extension      ?Ankle dorsiflexion      ?Ankle plantarflexion      ?Ankle inversion      ?Ankle eversion      ? (Blank rows = not tested) ?  ?LE MMT: extension not tested as patient unable to lay prone ?  ?MMT Right ?11/11/2021 Left ?11/11/2021 Right ?12/14/21 Left  ?12/14/21  ?Hip flexion 4/5 4+/5 5/5 5/5  ?Hip extension        ?Hip abduction 3/5 3/5 3+/5 4-/5  ?Hip adduction        ?Hip internal rotation        ?Hip external rotation        ?Knee flexion 4+/5 5/5 5/5 5/5  ?Knee extension 4+/5 5/5 5/5 5/5  ?Ankle  dorsiflexion 5/5 5/5    ?Ankle plantarflexion        ?Ankle inversion        ?Ankle eversion        ? (Blank rows = not tested) ?  ?  ?  ?FUNCTIONAL TESTS:  ?5 times sit to stand: 13.15 with heavy bilateral UE

## 2022-01-07 ENCOUNTER — Telehealth: Payer: Self-pay | Admitting: *Deleted

## 2022-01-07 NOTE — Telephone Encounter (Signed)
?  Care Management  ? ?Follow Up Note ? ? ?01/07/2022 ? ?Name: Shaun [name] MRN: [id number] DOB: 1956/06/28 ? ?Referred By: Maximiano Coss, NP ? ?Reason for Referral: Chronic Care Management Needs in Patient with DM2, CHF, CAD, Atrial Fib, HTN, HLD, COPD, Depression, Obesity, Debility, Sundowning, Tobacco Abuse, and Chronic Anticoagulation. ? ?An unsuccessful telephone outreach was attempted today. The patient was referred to the case management team for assistance with care management and care coordination. HIPAA compliant messages were left on voicemail, providing contact information, encouraging patient to return LCSW's call at his earliest convenience.  LCSW will make another follow-up telephone outreach call attempt within the next 7-10 business days, if a return call is not received from patient in the meantime. ? ?Follow-Up Plan:  Request placed with Scheduling Care Guide to reschedule patient's follow-up outreach call with LCSW. ? ?Nat Christen LCSW ?Licensed Clinical Social Worker ?[facility] ?[phone]  ?

## 2022-01-10 ENCOUNTER — Ambulatory Visit (HOSPITAL_COMMUNITY): Payer: Medicare Other | Admitting: Physical Therapy

## 2022-01-10 ENCOUNTER — Telehealth (HOSPITAL_COMMUNITY): Payer: Self-pay | Admitting: Physical Therapy

## 2022-01-10 NOTE — Telephone Encounter (Signed)
Patient forgot and will not be here -so sorry I missed this appointment ?

## 2022-01-11 ENCOUNTER — Other Ambulatory Visit: Payer: Self-pay | Admitting: Registered Nurse

## 2022-01-11 ENCOUNTER — Encounter: Payer: Self-pay | Admitting: Registered Nurse

## 2022-01-11 ENCOUNTER — Other Ambulatory Visit (HOSPITAL_BASED_OUTPATIENT_CLINIC_OR_DEPARTMENT_OTHER): Payer: Self-pay

## 2022-01-11 DIAGNOSIS — L299 Pruritus, unspecified: Secondary | ICD-10-CM

## 2022-01-11 MED ORDER — HYDROXYZINE HCL 10 MG PO TABS
10.0000 mg | ORAL_TABLET | Freq: Two times a day (BID) | ORAL | 0 refills | Status: DC | PRN
  Filled 2022-01-11: qty 30, 15d supply, fill #0

## 2022-01-12 ENCOUNTER — Other Ambulatory Visit (HOSPITAL_BASED_OUTPATIENT_CLINIC_OR_DEPARTMENT_OTHER): Payer: Self-pay

## 2022-01-12 ENCOUNTER — Ambulatory Visit (HOSPITAL_COMMUNITY): Payer: Medicare Other | Admitting: Physical Therapy

## 2022-01-12 ENCOUNTER — Encounter (HOSPITAL_COMMUNITY): Payer: Self-pay | Admitting: Physical Therapy

## 2022-01-12 DIAGNOSIS — M6281 Muscle weakness (generalized): Secondary | ICD-10-CM | POA: Diagnosis not present

## 2022-01-12 DIAGNOSIS — R29898 Other symptoms and signs involving the musculoskeletal system: Secondary | ICD-10-CM | POA: Diagnosis not present

## 2022-01-12 DIAGNOSIS — R2689 Other abnormalities of gait and mobility: Secondary | ICD-10-CM

## 2022-01-12 DIAGNOSIS — R5381 Other malaise: Secondary | ICD-10-CM | POA: Diagnosis not present

## 2022-01-12 DIAGNOSIS — M25551 Pain in right hip: Secondary | ICD-10-CM | POA: Diagnosis not present

## 2022-01-12 NOTE — Therapy (Signed)
?OUTPATIENT PHYSICAL THERAPY TREATMENT NOTE ? ? ?Patient Name: Shaun [name] ?MRN: [id number] ?DOB:01/15/1956, 66 y.o., male ?Today's Date: 01/12/2022 ? ?PCP: Maximiano Coss, NP ?REFERRING PROVIDER: Charlett Blake, MD ? ? ? PT End of Session - 01/12/22 0827   ? ? Visit Number 17   ? Number of Visits 24   ? Date for PT Re-Evaluation 01/25/22   ? Authorization Type UHC Medicare   ? Progress Note Due on Visit 20   ? PT Start Time [phone]   ? PT Stop Time 0909   ? PT Time Calculation (min) 41 min   ? Activity Tolerance Patient tolerated treatment well   ? Behavior During Therapy [facility] for tasks assessed/performed   ? ?  ?  ? ?  ? ? ?Past Medical History:  ?Diagnosis Date  ? Anemia   ? Anxiety   ? Asthma   ? CAD (coronary artery disease)   ? DES to distal circumflex 2016  ? Cataract   ? Colon polyps   ? 30 colon polyps found on first colonoscopy  ? Diastolic heart failure ([place])   ? Diverticulitis   ? DJD (degenerative joint disease)   ? GERD (gastroesophageal reflux disease)   ? History of kidney stones   ? Hyperlipidemia   ? Hypertension   ? Insomnia   ? Obstructive sleep apnea 12/2009  ? 01/26/2010 AHI 83/hr  ? Permanent atrial fibrillation ([place])   ? Onset 2006 paroxysmal then progressive to persistent  ? PUD (peptic ulcer disease)   ? 1980s  ? RLS (restless legs syndrome)   ? Sinusitis   ? Skin cancer   ? Type 2 diabetes mellitus ([place])   ? ?Past Surgical History:  ?Procedure Laterality Date  ? BIOPSY  07/17/2018  ? Procedure: BIOPSY;  Surgeon: Danie Binder, MD;  [facility];  Service: Endoscopy;;  colon  ? BOWEL RESECTION  09/17/2018  ? SMALL BOWEL RESECTION: 71 CM   ? CARDIAC CATHETERIZATION N/A 07/21/2015  ? Procedure: Left Heart Cath and Coronary Angiography;  Surgeon: Peter M Martinique, MD;  Location: [place] CV LAB;  Service: Cardiovascular;  Laterality: N/A;  ? CARDIAC CATHETERIZATION N/A 07/21/2015  ? Procedure: Coronary Stent Intervention;  Surgeon: Peter M Martinique, MD;  Location: [place]  CV LAB;  Service: Cardiovascular;  Laterality: N/A;  ? CIRCUMCISION N/A 04/05/2019  ? Procedure: CIRCUMCISION ADULT;  Surgeon: Irine Seal, MD;  Location: [facility] OR[facility];  Service: Urology;  Laterality: N/A;  ? COLONOSCOPY N/A 05/19/2014  ? Dr. Barnie Alderman diverticulosis/moderate external hemorrhoids, >20 simple adenomas. Genetic screening negative.   ? COLONOSCOPY WITH PROPOFOL N/A 07/17/2018  ? Dr. Oneida Alar: Diverticulosis, external/internal hemorrhoids, 32 colon polyps removed.  ten tubular adenomas removed with no high-grade dysplasia.  Advised to have surveillance colonoscopy in 3 years.  ? COLONOSCOPY WITH PROPOFOL N/A 06/07/2021  ? Procedure: COLONOSCOPY WITH PROPOFOL;  Surgeon: Eloise Harman, DO;  [facility];  Service: Endoscopy;  Laterality: N/A;  9:30 / ASA 3  (Pt was told that his time will be given at Pre-op)  ? ESOPHAGOGASTRODUODENOSCOPY (EGD) WITH PROPOFOL N/A 07/17/2018  ? Dr. Oneida Alar: Low-grade narrowing Schatzki ring at the GE junction status post dilation.  Gastritis.  Biopsy with mild nonspecific reactive gastropathy.  No H. pylori.  ? GIVENS CAPSULE STUDY N/A 06/24/2019  ? normal  ? HERNIA REPAIR  1986  ? Left inguinal  ? INTRAVASCULAR PRESSURE WIRE/FFR STUDY Left 06/08/2017  ? Procedure: INTRAVASCULAR PRESSURE WIRE/FFR STUDY;  Surgeon: Nelva Bush, MD;  Location: [place] CV LAB;  Service: Cardiovascular;  Laterality: Left;  LAD and CFX  ? LAPAROTOMY N/A 09/17/2018  ? Procedure: EXPLORATORY LAPAROTOMY;  Surgeon: Virl Cagey, MD;  Location: [facility];  Service: General;  Laterality: N/A;  ? LEFT HEART CATH AND CORONARY ANGIOGRAPHY N/A 06/08/2017  ? Procedure: LEFT HEART CATH AND CORONARY ANGIOGRAPHY;  Surgeon: Nelva Bush, MD;  Location: [place] CV LAB;  Service: Cardiovascular;  Laterality: N/A;  ? POLYPECTOMY  07/17/2018  ? Procedure: POLYPECTOMY;  Surgeon: Danie Binder, MD;  [facility];  Service: Endoscopy;;  colon  ? POLYPECTOMY  06/07/2021  ? Procedure:  POLYPECTOMY INTESTINAL;  Surgeon: Eloise Harman, DO;  [facility];  Service: Endoscopy;;  ? ROTATOR CUFF REPAIR    ? Right  ? SAVORY DILATION N/A 07/17/2018  ? Procedure: SAVORY DILATION;  Surgeon: Danie Binder, MD;  [facility];  Service: Endoscopy;  Laterality: N/A;  ? ?Patient Active Problem List  ? Diagnosis Date Noted  ? Debility 10/21/2021  ? Acute respiratory failure ([place]) 08/25/2021  ? Sundowning 08/23/2021  ? Dysphagia 08/22/2021  ? Hypernatremia 08/22/2021  ? Obesity (BMI 30-39.9) 08/21/2021  ? Endotracheally intubated   ? Influenza   ? Acute respiratory failure with hypoxemia ([place]) 08/06/2021  ? Ventral hernia 12/31/2020  ? Nausea with vomiting 12/31/2019  ? Dyslipidemia, goal LDL below 70 06/18/2019  ? Hx of adenomatous colonic polyps 10/17/2018  ? Anemia 10/17/2018  ? Phimosis of penis 09/27/2018  ? Primary osteoarthritis of right hip 06/20/2018  ? Esophageal dysphagia 05/21/2018  ? GERD (gastroesophageal reflux disease) 05/21/2018  ? RLS (restless legs syndrome)   ? PUD (peptic ulcer disease)   ? Insomnia   ? Chronic diastolic CHF (congestive heart failure) ([place])   ? Asthma   ? Arteriosclerotic cardiovascular disease (ASCVD)   ? Chronic anticoagulation 01/27/2017  ? Depression 09/24/2015  ? COPD (chronic obstructive pulmonary disease) ([place]) 07/20/2015  ? Accelerating angina ([place]) 07/19/2015  ? Genetic testing 09/09/2014  ? H/O adenomatous polyp of colon 08/11/2014  ? Non-insulin treated type 2 diabetes mellitus ([place]) 02/09/2011  ? CAD S/P percutaneous coronary angioplasty   ? Tobacco abuse   ? Essential hypertension   ? Chronic atrial fibrillation ([place]) 02/23/2010  ? Obstructive sleep apnea 12/27/2009  ? ? ?REFERRING DIAG: M16.11 (ICD-10-CM) - Primary osteoarthritis of right hip R53.81 (ICD-10-CM) - Debility   ? ?THERAPY DIAG:  ?Pain in right hip ? ?Muscle weakness (generalized) ? ?Debility ? ?Other abnormalities of gait and mobility ? ?Other symptoms and signs involving  the musculoskeletal system ? ?PERTINENT HISTORY: Pneumonia 12/22, afib, COPD, CHF, DM, anxiety, depression ? ?PRECAUTIONS: Fall ? ?SUBJECTIVE: Overall hip has been improving. Back has been bothering him.  ? ?PAIN:  ?Are you having pain? None in hip, back soreness ? ?Pain description: sore ?Aggravating factors: movement, sleep position ?Relieving factors: recliner ? ? ? ?OBJECTIVE:  ?  ?Below in italics are FROM EVALUATION =   ? PATIENT SURVEYS:  ?FOTO 42% function; 12/14/21 57% function ?  ?OBSERVATION:  Ambulates without AD, unsteady with wide BOS, antalgic on L  ?  ?POSTURE:  ?Frequently slouched with lateral lean in seated  ?  ?PALPATION: ?TTP R glute med/min and max ?  ?LE ROM: decreased R hip abd and extension > L  ?  ?Active ROM Right ?11/11/2021 Left ?11/11/2021  ?Hip flexion      ?Hip extension      ?  Hip abduction      ?Hip adduction      ?Hip internal rotation      ?Hip external rotation      ?Knee flexion      ?Knee extension      ?Ankle dorsiflexion      ?Ankle plantarflexion      ?Ankle inversion      ?Ankle eversion      ? (Blank rows = not tested) ?  ?LE MMT: extension not tested as patient unable to lay prone ?  ?MMT Right ?11/11/2021 Left ?11/11/2021 Right ?12/14/21 Left  ?12/14/21  ?Hip flexion 4/5 4+/5 5/5 5/5  ?Hip extension        ?Hip abduction 3/5 3/5 3+/5 4-/5  ?Hip adduction        ?Hip internal rotation        ?Hip external rotation        ?Knee flexion 4+/5 5/5 5/5 5/5  ?Knee extension 4+/5 5/5 5/5 5/5  ?Ankle dorsiflexion 5/5 5/5    ?Ankle plantarflexion        ?Ankle inversion        ?Ankle eversion        ? (Blank rows = not tested) ?  ?  ?  ?FUNCTIONAL TESTS:  ?5 times sit to stand: 13.15 with heavy bilateral UE support ?2 minute walk test: 345 feet ?Transfers: Bilateral UE use, labored ? ?12/14/21 ?5 times sit to stand: 13.97 seconds without UE supports ?2 minute walk test: 395 feet ?Transfers: without UE support ?  ?GAIT: ?Distance walked: 395 feet ?Assistive device utilized: None ?Level of  assistance: Modified independence ?Comments: 2MWT, antalgic, decreased R hip extension, slight trunk flexion for limited r hip extension, antalgic ?  ?  ?  ?TODAY'S TREATMENT: ?01/12/22 ?Gait trainer

## 2022-01-13 ENCOUNTER — Ambulatory Visit: Payer: Medicare Other | Admitting: Internal Medicine

## 2022-01-13 ENCOUNTER — Encounter: Payer: Self-pay | Admitting: Family Medicine

## 2022-01-13 ENCOUNTER — Ambulatory Visit (INDEPENDENT_AMBULATORY_CARE_PROVIDER_SITE_OTHER): Payer: Medicare Other | Admitting: Family Medicine

## 2022-01-13 ENCOUNTER — Other Ambulatory Visit (HOSPITAL_BASED_OUTPATIENT_CLINIC_OR_DEPARTMENT_OTHER): Payer: Self-pay

## 2022-01-13 VITALS — BP 122/68 | HR 79 | Temp 98.4°F | Resp 18 | Ht 67.0 in | Wt 222.0 lb

## 2022-01-13 DIAGNOSIS — B86 Scabies: Secondary | ICD-10-CM | POA: Diagnosis not present

## 2022-01-13 MED ORDER — TRIAMCINOLONE ACETONIDE 0.1 % EX OINT
1.0000 "application " | TOPICAL_OINTMENT | Freq: Two times a day (BID) | CUTANEOUS | 1 refills | Status: DC
  Filled 2022-01-13: qty 60, 7d supply, fill #0

## 2022-01-13 MED ORDER — PREDNISONE 10 MG PO TABS
ORAL_TABLET | ORAL | 0 refills | Status: DC
  Filled 2022-01-13: qty 18, 9d supply, fill #0

## 2022-01-13 MED ORDER — PERMETHRIN 5 % EX CREA
1.0000 "application " | TOPICAL_CREAM | Freq: Once | CUTANEOUS | 0 refills | Status: AC
  Filled 2022-01-13: qty 60, 2d supply, fill #0

## 2022-01-13 NOTE — Progress Notes (Signed)
   Subjective:    Patient ID: Shaun [name], male    DOB: 10/24/1955, 66 y.o.   MRN: [id number]  HPI Rash- sxs started 1 week ago 'and it's just gotten worse'.  Has not been working in the yard or exposed to anything new that he is aware of.  Pt has a dog- no fleas.  Has not slept anywhere new or different.  Wife is not itching nor are his 2 grandchildren who live in the house.  Itching is worse at night.  No tick bites.  Rash is on arms (where it started), trunk, legs.  Not on palms or soles.  Not on face.   Review of Systems For ROS see HPI     Objective:   Physical Exam Vitals reviewed.  Constitutional:      General: He is not in acute distress.    Appearance: Normal appearance. He is not ill-appearing.     Comments: Very uncomfortable appearing, scratching throughout visit  HENT:     Head: Normocephalic and atraumatic.  Skin:    General: Skin is warm and dry.     Findings: Rash (pt w/ excoriations on arms bilaterally, raised linear vesicles on upper arms and legs, red bumps and vesicles on chest) present.  Neurological:     General: No focal deficit present.     Mental Status: He is alert and oriented to person, place, and time.  Psychiatric:        Mood and Affect: Mood normal.        Behavior: Behavior normal.        Thought Content: Thought content normal.          Assessment & Plan:   Scabies- new.  Pt has widespread and diffuse itching but not on face, palms, soles.  He has excoriations and vesicles consistent w/ scabies.  Use Permethrin cream tonight.  Start topical triamcinolone for itching.  Prednisone taper to help w/ itching.  Pt encouraged to start OTC antihistamine to help w/ itching.  Dx and appropriate tx reviewed w/ pt.  Handout provided.  Pt expressed understanding and is in agreement w/ plan.

## 2022-01-13 NOTE — Patient Instructions (Addendum)
This appears to be scabies- a very itchy mite that burrows under your skin APPLY the Permethrin cream tonight from your jaw down and wear it overnight.  Wash it off in the morning.  Make sure to change your sheets in the morning and use a new towel Wash all clothes that have been worn in the last week This can be transmitted to others, so watch for itchy family members TAKE the Prednisone as directed to help w/ the itching USE the Triamcinolone ointment on the areas that are itching up to twice daily Take Claritin or Zyrtec to help w/ itching Call with any questions or concerns Hang in there!  Scabies, Adult  Scabies is a skin condition that happens when very small insects called mites get under the skin (infestation). This causes a rash and severe itchiness. Scabies is contagious, which means it can spread from person to person. If you get scabies, it is common for others in your household to get scabies too. With proper treatment, symptoms usually go away in 2-4 weeks. Scabies usually does not cause lasting problems. What are the causes? This condition is caused by tiny mites (Sarcoptes scabiei, or human itch mites) that can only be seen with a microscope. The mites get into the top layer of skin and lay eggs. Scabies can spread from person to person through: Close contact with a person who has scabies. Sharing or having contact with infested items, such as towels, bedding, or clothing. What increases the risk? The following factors may make you more likely to develop this condition: Living in a nursing home or other extended care facility. Having sexual contact with a partner who has scabies. Caring for others who are at increased risk for scabies. What are the signs or symptoms? Symptoms of this condition include: Severe itchiness. This is often worse at night. A rash that includes tiny red bumps or blisters. The rash commonly occurs on the hands, wrists, elbows, armpits, chest, waist,  groin, or buttocks. The bumps may form a line (burrow) in some areas. Skin irritation. This can include scaly patches or sores. How is this diagnosed? This condition may be diagnosed based on: A physical exam of the skin. A skin test. Your health care provider may take a sample of your affected skin (skin scraping) and have it examined under a microscope for signs of mites. How is this treated? This condition may be treated with: Medicated cream or lotion that kills the mites. This is spread on the entire body and left on for several hours. Usually, one treatment with medicated cream or lotion is enough to kill all the mites. In severe cases, the treatment may need to be repeated. Medicated cream that relieves itching. Medicines taken by mouth (orally) that: Relieve itching. Reduce the swelling and redness. Kill the mites. This treatment may be done in severe cases. Follow these instructions at home: Medicines Take or apply over-the-counter and prescription medicines only as told by your health care provider. Apply medicated cream or lotion as told by your health care provider. Do not wash off the medicated cream or lotion until the necessary amount of time has passed. Skin care Avoid scratching the affected areas of your skin. Keep your fingernails closely trimmed to reduce injury from scratching. Take cool baths or apply cool washcloths to your skin to help reduce itching. General instructions Clean all items that you had contact with during the 3 days before diagnosis. This includes bedding, clothing, towels, and furniture. Do this  on the same day that you start treatment. Dry-clean items, or use hot water to wash items. Dry items on the hot dry cycle. Place items that cannot be washed into closed, airtight plastic bags for at least 3 days. The mites cannot live for more than 3 days away from human skin. Vacuum furniture and mattresses that you use. Make sure that other people who may  have been infested are examined by a health care provider. These include members of your household and anyone who may have had contact with infested items. Keep all follow-up visits. This is important. Where to find more information Centers for Disease Control and Prevention: http://www.wolf.info/ Contact a health care provider if: You have itching that does not go away after 4 weeks of treatment. You continue to develop new bumps or burrows. You have redness, swelling, or pain in your rash area after treatment. You have fluid, blood, or pus coming from your rash. Summary Scabies is a skin condition that causes a rash and severe itchiness. This condition is caused by tiny mites that get into the top layer of the skin and lay eggs. Scabies can spread from person to person. Follow treatments as recommended by your health care provider. Clean all items that you recently had contact with. This information is not intended to replace advice given to you by your health care provider. Make sure you discuss any questions you have with your health care provider. Document Revised: 12/13/2019 Document Reviewed: 12/13/2019 Elsevier Patient Education  [place].

## 2022-01-17 ENCOUNTER — Other Ambulatory Visit: Payer: Self-pay | Admitting: Registered Nurse

## 2022-01-17 DIAGNOSIS — F5101 Primary insomnia: Secondary | ICD-10-CM

## 2022-01-17 NOTE — Telephone Encounter (Signed)
Patient is requesting a refill of the following medications: Requested Prescriptions   Pending Prescriptions Disp Refills   sildenafil (VIAGRA) 100 MG tablet 8 tablet 0    Sig: TAKE 1 TABLET BY MOUTH 30 MINUTES BEFORE ACTIVITY    Date of patient request: 01/17/2022 Last office visit: 01/13/2022 Date of last refill: 11/25/2021 Last refill amount: 8 tablets  Follow up time period per chart: 02/09/2022

## 2022-01-17 NOTE — Telephone Encounter (Signed)
Patient is requesting a refill of the following medications: Requested Prescriptions   Pending Prescriptions Disp Refills   QUEtiapine (SEROQUEL) 50 MG tablet 90 tablet 0    Sig: Take 1 tablet (50 mg total) by mouth at bedtime.    Date of patient request: 01/17/2022 Last office visit: 01/13/2022 Date of last refill: 10/20/2021 Last refill amount: 90 tablets Follow up time period per chart: 04/19/2022

## 2022-01-18 ENCOUNTER — Ambulatory Visit (HOSPITAL_COMMUNITY): Payer: Medicare Other

## 2022-01-18 ENCOUNTER — Other Ambulatory Visit (HOSPITAL_BASED_OUTPATIENT_CLINIC_OR_DEPARTMENT_OTHER): Payer: Self-pay

## 2022-01-18 DIAGNOSIS — M25551 Pain in right hip: Secondary | ICD-10-CM

## 2022-01-18 DIAGNOSIS — R2689 Other abnormalities of gait and mobility: Secondary | ICD-10-CM

## 2022-01-18 DIAGNOSIS — M6281 Muscle weakness (generalized): Secondary | ICD-10-CM | POA: Diagnosis not present

## 2022-01-18 DIAGNOSIS — R29898 Other symptoms and signs involving the musculoskeletal system: Secondary | ICD-10-CM

## 2022-01-18 DIAGNOSIS — R5381 Other malaise: Secondary | ICD-10-CM

## 2022-01-18 MED ORDER — QUETIAPINE FUMARATE 50 MG PO TABS
50.0000 mg | ORAL_TABLET | Freq: Every day | ORAL | 0 refills | Status: DC
  Filled 2022-01-18: qty 90, 90d supply, fill #0

## 2022-01-18 MED ORDER — SILDENAFIL CITRATE 100 MG PO TABS
ORAL_TABLET | ORAL | 0 refills | Status: DC
  Filled 2022-01-18: qty 8, 60d supply, fill #0

## 2022-01-18 NOTE — Therapy (Signed)
OUTPATIENT PHYSICAL THERAPY TREATMENT NOTE   Patient Name: Shaun [name] MRN: [id number] DOB:May 09, 1956, 66 y.o., male Today's Date: 01/18/2022  PCP: Maximiano Coss, NP REFERRING PROVIDER: Charlett Blake, MD    PT End of Session - 01/18/22 [phone]     Visit Number 18    Number of Visits 24    Date for PT Re-Evaluation 01/25/22    Authorization Type [employer] Medicare    Progress Note Due on Visit 25    PT Start Time 0814    PT Stop Time 0900    PT Time Calculation (min) 46 min    Activity Tolerance Patient tolerated treatment well    Behavior During Therapy [facility] for tasks assessed/performed              Past Medical History:  Diagnosis Date   Anemia    Anxiety    Asthma    CAD (coronary artery disease)    DES to distal circumflex 2016   Cataract    Colon polyps    30 colon polyps found on first colonoscopy   Diastolic heart failure (HCC)    Diverticulitis    DJD (degenerative joint disease)    GERD (gastroesophageal reflux disease)    History of kidney stones    Hyperlipidemia    Hypertension    Insomnia    Obstructive sleep apnea 12/2009   01/26/2010 AHI 83/hr   Permanent atrial fibrillation ([place])    Onset 2006 paroxysmal then progressive to persistent   PUD (peptic ulcer disease)    1980s   RLS (restless legs syndrome)    Sinusitis    Skin cancer    Type 2 diabetes mellitus ([place])    Past Surgical History:  Procedure Laterality Date   BIOPSY  07/17/2018   Procedure: BIOPSY;  Surgeon: Danie Binder, MD;  [facility];  Service: Endoscopy;;  colon   BOWEL RESECTION  09/17/2018   SMALL BOWEL RESECTION: 71 CM    CARDIAC CATHETERIZATION N/A 07/21/2015   Procedure: Left Heart Cath and Coronary Angiography;  Surgeon: Peter M Martinique, MD;  Location: [place] [facility];  Service: Cardiovascular;  Laterality: N/A;   CARDIAC CATHETERIZATION N/A 07/21/2015   Procedure: Coronary Stent Intervention;  Surgeon: Peter M Martinique, MD;  Location: [place] [facility];  Service: Cardiovascular;  Laterality: N/A;   CIRCUMCISION N/A 04/05/2019   Procedure: CIRCUMCISION ADULT;  Surgeon: Irine Seal, MD;  Location: [facility];  Service: Urology;  Laterality: N/A;   COLONOSCOPY N/A 05/19/2014   Dr. Barnie Alderman diverticulosis/moderate external hemorrhoids, >20 simple adenomas. Genetic screening negative.    COLONOSCOPY WITH PROPOFOL N/A 07/17/2018   Dr. Oneida Alar: Diverticulosis, external/internal hemorrhoids, 32 colon polyps removed.  ten tubular adenomas removed with no high-grade dysplasia.  Advised to have surveillance colonoscopy in 3 years.   COLONOSCOPY WITH PROPOFOL N/A 06/07/2021   Procedure: COLONOSCOPY WITH PROPOFOL;  Surgeon: Eloise Harman, DO;  [facility];  Service: Endoscopy;  Laterality: N/A;  9:30 / ASA 3  (Pt was told that his time will be given at Pre-op)   ESOPHAGOGASTRODUODENOSCOPY (EGD) WITH PROPOFOL N/A 07/17/2018   Dr. Oneida Alar: Low-grade narrowing Schatzki ring at the GE junction status post dilation.  Gastritis.  Biopsy with mild nonspecific reactive gastropathy.  No H. pylori.   GIVENS CAPSULE STUDY N/A 06/24/2019   normal   HERNIA REPAIR  1986   Left inguinal   INTRAVASCULAR PRESSURE WIRE/FFR STUDY Left 06/08/2017   Procedure: INTRAVASCULAR PRESSURE WIRE/FFR STUDY;  Surgeon: Nelva Bush, MD;  Location: [place] [facility];  Service: Cardiovascular;  Laterality: Left;  LAD and CFX   LAPAROTOMY N/A 09/17/2018   Procedure: EXPLORATORY LAPAROTOMY;  Surgeon: Virl Cagey, MD;  Location: [facility];  Service: General;  Laterality: N/A;   LEFT HEART CATH AND CORONARY ANGIOGRAPHY N/A 06/08/2017   Procedure: LEFT HEART CATH AND CORONARY ANGIOGRAPHY;  Surgeon: Nelva Bush, MD;  Location: [place] [facility];  Service: Cardiovascular;  Laterality: N/A;   POLYPECTOMY  07/17/2018   Procedure: POLYPECTOMY;  Surgeon: Danie Binder, MD;  [facility];  Service: Endoscopy;;  colon   POLYPECTOMY  06/07/2021    Procedure: POLYPECTOMY INTESTINAL;  Surgeon: Eloise Harman, DO;  [facility];  Service: Endoscopy;;   ROTATOR CUFF REPAIR     Right   SAVORY DILATION N/A 07/17/2018   Procedure: SAVORY DILATION;  Surgeon: Danie Binder, MD;  [facility];  Service: Endoscopy;  Laterality: N/A;   Patient Active Problem List   Diagnosis Date Noted   Debility 10/21/2021   Acute respiratory failure ([place]) 08/25/2021   Sundowning 08/23/2021   Dysphagia 08/22/2021   Hypernatremia 08/22/2021   Obesity (BMI 30-39.9) 08/21/2021   Endotracheally intubated    Influenza    Acute respiratory failure with hypoxemia ([place]) 08/06/2021   Ventral hernia 12/31/2020   Nausea with vomiting 12/31/2019   Dyslipidemia, goal LDL below 70 06/18/2019   Hx of adenomatous colonic polyps 10/17/2018   Anemia 10/17/2018   Phimosis of penis 09/27/2018   Primary osteoarthritis of right hip 06/20/2018   Esophageal dysphagia 05/21/2018   GERD (gastroesophageal reflux disease) 05/21/2018   RLS (restless legs syndrome)    PUD (peptic ulcer disease)    Insomnia    Chronic diastolic CHF (congestive heart failure) ([place])    Asthma    Arteriosclerotic cardiovascular disease (ASCVD)    Chronic anticoagulation 01/27/2017   Depression 09/24/2015   COPD (chronic obstructive pulmonary disease) ([place]) 07/20/2015   Accelerating angina ([place]) 07/19/2015   Genetic testing 09/09/2014   H/O adenomatous polyp of colon 08/11/2014   Non-insulin treated type 2 diabetes mellitus ([place]) 02/09/2011   CAD S/P percutaneous coronary angioplasty    Tobacco abuse    Essential hypertension    Chronic atrial fibrillation ([place]) 02/23/2010   Obstructive sleep apnea 12/27/2009    REFERRING DIAG: M16.11 (ICD-10-CM) - Primary osteoarthritis of right hip R53.81 (ICD-10-CM) - Debility    THERAPY DIAG:  Pain in right hip  Other abnormalities of gait and mobility  Muscle weakness (generalized)  Other symptoms and signs involving  the musculoskeletal system  Debility  PERTINENT HISTORY: Pneumonia 12/22, afib, COPD, CHF, DM, anxiety, depression  PRECAUTIONS: Fall  SUBJECTIVE: patient reports he is much better overall. Did some weed eating yesterday and did fine; not as sore today as he thought he would be from it.  Back still a little sore.  PAIN:  Are you having pain? None in hip, back soreness  Pain description: sore Aggravating factors: movement, sleep position Relieving factors: recliner    OBJECTIVE:    Below in italics are FROM EVALUATION =    PATIENT SURVEYS:  FOTO 42% function; 12/14/21 57% function   OBSERVATION:  Ambulates without AD, unsteady with wide BOS, antalgic on L    POSTURE:  Frequently slouched with lateral lean in seated    PALPATION: TTP R glute med/min and max   LE ROM: decreased R hip abd and extension > L  Active ROM Right 11/11/2021 Left 11/11/2021  Hip flexion      Hip extension      Hip abduction      Hip adduction      Hip internal rotation      Hip external rotation      Knee flexion      Knee extension      Ankle dorsiflexion      Ankle plantarflexion      Ankle inversion      Ankle eversion       (Blank rows = not tested)   LE MMT: extension not tested as patient unable to lay prone   MMT Right 11/11/2021 Left 11/11/2021 Right 12/14/21 Left  12/14/21  Hip flexion 4/5 4+/5 5/5 5/5  Hip extension        Hip abduction 3/5 3/5 3+/5 4-/5  Hip adduction        Hip internal rotation        Hip external rotation        Knee flexion 4+/5 5/5 5/5 5/5  Knee extension 4+/5 5/5 5/5 5/5  Ankle dorsiflexion 5/5 5/5    Ankle plantarflexion        Ankle inversion        Ankle eversion         (Blank rows = not tested)       FUNCTIONAL TESTS:  5 times sit to stand: 13.15 with heavy bilateral UE support 2 minute walk test: 345 feet Transfers: Bilateral UE use, labored  12/14/21 5 times sit to stand: 13.97 seconds without UE supports 2 minute walk test: 395  feet Transfers: without UE support   GAIT: Distance walked: 395 feet Assistive device utilized: None Level of assistance: Modified independence Comments: 2MWT, antalgic, decreased R hip extension, slight trunk flexion for limited r hip extension, antalgic       TODAY'S TREATMENT: 01/18/22 Gait trainer 1.4 MPH step length 58cm 6 minutes for warm up with cueing for step length PRN Hamstring stretch on 12" box 3 x 30"  Quadruped fire hydrants 2x 10 RTB around knees bilateral Quadruped hip extension 2x 10 with RTB around knees bilateral  Bridge with legs on ball 2 x 10 Hip abduction and extension floor slides 2 x 10 each leg Left Standing Lateral Shift Correction at Wall 2 x 10; last set with self overpressure Ball squats 2 x 10 Shoulder ext; same time and alt x 10 each on foam red thera band Scapular retraction on foam red thera band 12" step ups 2 x 10 each no UE assist       01/12/22 Gait trainer 1.4 MPH step length 58cm 6 minutes for warm up with cueing for step length PRN Standing hip openers with b/l UE support x5 b/l 20 second holds Quadruped fire hydrants 2x 10 5 second holds bilateral Quadruped hip extension 2x 10 with 5 second holds bilateral  Farmer carry 10# 3x 10 feet Lateral side glides to L 3x 10  Palof press with feet in tandem 2x 15 bilateral green band Lateral step down 7 inch step 2x 10 bilateral     01/06/22: TM 3% elevation 1.7 mph, lateral no elevation .32mh Farmer's carry 10# 2RT eachUE Proper lifting 10# 10x Resisted walks with dark green thick sports band ant and retro 80' x3 each direction Standing hip openers with b/l UE support x10 b/l  Tandem stance on foam 1x 30", paloff in tandem stance on foam 4x 10  Quadruped: UE/LE 10x  5"  Cat/camel 10x 10"  Fire hydrant 10x  Child's pose 3x 30"  01/04/22 TM 4 way, 2 mins each direction, with b/l or uni UE support lateral at .57mh, ant 2.0 mph, retro at .859m  Stair stretch  with low back rotation 15"  x2 b/l   Quadruped  Cat/camel x20 Fire hydrant x10 b/l   Side steps with green theraband and 10# dumbbell hold at chest 10'x4   Resisted walks with dark green thick sports band ant and retro 80' x3 each direction  Standing hip openers with b/l UE support x10 b/l   All performed at SBA    12/31/21: Quadruped: UE 5x 5"  LE 5x 5" Standing: Sidestep GTB 3RT down long hallway  3D hip excursion 2x 10  Tandem stance on 2 x30" on foam   Paloff in tandem stance 15x 4  Marching with RTB shoulder extension 10x 3"  Vector stance 5x 5"  Lunges 15x Gait trainer treadmill 1.2-->1.7spm x 55m755m  Hamstring stretch 2x 30" on 12in step        PATIENT EDUCATION:  Education details: HEP update Person educated: Patient Education method: Explanation, Demonstration Education comprehension: verbalized understanding and returned demonstration     HOME EXERCISE PROGRAM:   Access Code: [email or web address] - 2 x daily - 7 x weekly - 2 sets - 10 reps Supine Bridge - 2 x daily - 7 x weekly - 2 sets - 10 reps Supine Lower Trunk Rotation - 2 x daily - 7 x weekly - 2 sets - 10 reps Standing Hip Abduction - 2 x daily - 7 x weekly - 2 sets - 10 reps 3/22: tandem stance, 3D hip excursion, heel/toe raises 3/30 Squat, lunge 4/27: 3D hip excursion 12/28/21: RTB theraband shoulder extension and row 5/5: quadruped UE then LE, sidestep GTB 5/9: quadruped cat/camel  01/06/22: Child's pose 3x30" 5/17 - Left Standing Lateral Shift Correction at [place] CLINICAL IMPRESSION: Began session on treadmill gait trainer for improving gait mechanics with cueing for equal step length ; noted improvement with cues to reach forward with his heels. Continues to take decreased step length with LLE due to decreased R stance time secondary to glute weakness. Continued with hip mobility and core/hip strengthening exercises which are tolerated well. Patient works hard in therapy.  Added red thera band to  quadruped exercises to increase intensity.added hip ext and abduction floor slides for core stabilization and hip strenthening.  Patient will continue to benefit from physical therapy in order to improve function and reduce impairment.    OBJECTIVE IMPAIRMENTS Abnormal gait, decreased activity tolerance, decreased balance, decreased endurance, decreased knowledge of use of DME, decreased mobility, difficulty walking, decreased ROM, decreased strength, impaired flexibility, improper body mechanics, and pain.    ACTIVITY LIMITATIONS cleaning, community activity, meal prep, yard work, shopping, and yard work.    PERSONAL FACTORS Fitness, Time since onset of injury/illness/exacerbation, and 3+ comorbidities: Pneumonia 12/22, afib, COPD, CHF, DM, anxiety, depression  are also affecting patient's functional outcome.      REHAB POTENTIAL: Good   CLINICAL DECISION MAKING: Stable/uncomplicated   EVALUATION COMPLEXITY: Low     GOALS: Goals reviewed with patient? Yes   SHORT TERM GOALS: Target date:  12/02/21   Patient will be independent with HEP in order to improve functional outcomes. Baseline:  Goal status: Met   2.  Patient will report at least 25% improvement in symptoms for improved quality of  life. Baseline:  Goal status: Met       LONG TERM GOALS: Target date: 12/23/21   Patient will report at least 75% improvement in symptoms for improved quality of life. Baseline:  Goal status: ONGOING   2.  Patient will be able to complete 5x STS in under 11.4 seconds without UE support in order to reduce the risk of falls. Baseline:  Goal status:ONGOING   3.  Patient will be able to ambulate at least 400 feet in 2MWT in order to demonstrate improved gait speed for community ambulation.  Baseline:  Goal status: ONGOING   4.  Patient will demonstrate grade of 5/5 MMT grade in all tested musculature as evidence of improved strength to assist with stair ambulation and gait.   Baseline:  Goal  status: ONGOING   5.  Patient will report ability to ambulate for  at least 20 minutes with least restrictive assistive device with no greater than 3/10 pain. Baseline:  Goal status: MET         PLAN: PT FREQUENCY: 2x/week   PT DURATION: 6 weeks   PLANNED INTERVENTIONS: Therapeutic exercises, Therapeutic activity, Neuromuscular re-education, Balance training, Gait training, Patient/Family education, Joint manipulation, Joint mobilization, Stair training, Orthotic/Fit training, DME instructions, Aquatic Therapy, Dry Needling, Electrical stimulation, Spinal manipulation, Spinal mobilization, Cryotherapy, Moist heat, Compression bandaging, scar mobilization, Splintting, Taping, Traction, Ultrasound, Ionotophoresis 39m/ml Dexamethasone, and Manual therapy     PLAN FOR NEXT SESSION: Continue glute and  functional strengthening as able, Add more gait, standing and balance training, progress as tolerated.   9:01 AM, 01/18/22 Corby Villasenor Small Shonice Wrisley MPT [place] [facility] [place] #[phone]       .

## 2022-01-20 ENCOUNTER — Encounter (HOSPITAL_COMMUNITY): Payer: Self-pay | Admitting: Physical Therapy

## 2022-01-20 ENCOUNTER — Ambulatory Visit (HOSPITAL_COMMUNITY): Payer: Medicare Other | Admitting: Physical Therapy

## 2022-01-20 DIAGNOSIS — M6281 Muscle weakness (generalized): Secondary | ICD-10-CM | POA: Diagnosis not present

## 2022-01-20 DIAGNOSIS — M25551 Pain in right hip: Secondary | ICD-10-CM | POA: Diagnosis not present

## 2022-01-20 DIAGNOSIS — R5381 Other malaise: Secondary | ICD-10-CM | POA: Diagnosis not present

## 2022-01-20 DIAGNOSIS — R2689 Other abnormalities of gait and mobility: Secondary | ICD-10-CM

## 2022-01-20 DIAGNOSIS — R29898 Other symptoms and signs involving the musculoskeletal system: Secondary | ICD-10-CM

## 2022-01-20 NOTE — Therapy (Signed)
OUTPATIENT PHYSICAL THERAPY TREATMENT NOTE   Patient Name: OVILA LEPAGE MRN: [id number] DOB:[birth year], 66 y.o., male Today's Date: 01/20/2022  PCP: Maximiano Coss, NP REFERRING PROVIDER: Charlett Blake, MD    PT End of Session - 01/20/22 0745     Visit Number 19    Number of Visits 24    Date for PT Re-Evaluation 01/25/22    Authorization Type [employer] Medicare    Progress Note Due on Visit 20    PT Start Time 0745    PT Stop Time 0825    PT Time Calculation (min) 40 min    Activity Tolerance Patient tolerated treatment well    Behavior During Therapy [facility] for tasks assessed/performed              Past Medical History:  Diagnosis Date   Anemia    Anxiety    Asthma    CAD (coronary artery disease)    DES to distal circumflex 2016   Cataract    Colon polyps    30 colon polyps found on first colonoscopy   Diastolic heart failure (HCC)    Diverticulitis    DJD (degenerative joint disease)    GERD (gastroesophageal reflux disease)    History of kidney stones    Hyperlipidemia    Hypertension    Insomnia    Obstructive sleep apnea 12/2009   01/26/2010 AHI 83/hr   Permanent atrial fibrillation ([place])    Onset 2006 paroxysmal then progressive to persistent   PUD (peptic ulcer disease)    1980s   RLS (restless legs syndrome)    Sinusitis    Skin cancer    Type 2 diabetes mellitus ([place])    Past Surgical History:  Procedure Laterality Date   BIOPSY  07/17/2018   Procedure: BIOPSY;  Surgeon: Danie Binder, MD;  [facility];  Service: Endoscopy;;  colon   BOWEL RESECTION  09/17/2018   SMALL BOWEL RESECTION: 71 CM    CARDIAC CATHETERIZATION N/A 07/21/2015   Procedure: Left Heart Cath and Coronary Angiography;  Surgeon: Peter M Martinique, MD;  Location: [place] [facility];  Service: Cardiovascular;  Laterality: N/A;   CARDIAC CATHETERIZATION N/A 07/21/2015   Procedure: Coronary Stent Intervention;  Surgeon: Peter M Martinique, MD;  Location: [place] [facility];  Service: Cardiovascular;  Laterality: N/A;   CIRCUMCISION N/A 04/05/2019   Procedure: CIRCUMCISION ADULT;  Surgeon: Irine Seal, MD;  Location: [facility];  Service: Urology;  Laterality: N/A;   COLONOSCOPY N/A 05/19/2014   Dr. Barnie Alderman diverticulosis/moderate external hemorrhoids, >20 simple adenomas. Genetic screening negative.    COLONOSCOPY WITH PROPOFOL N/A 07/17/2018   Dr. Oneida Alar: Diverticulosis, external/internal hemorrhoids, 32 colon polyps removed.  ten tubular adenomas removed with no high-grade dysplasia.  Advised to have surveillance colonoscopy in 3 years.   COLONOSCOPY WITH PROPOFOL N/A 06/07/2021   Procedure: COLONOSCOPY WITH PROPOFOL;  Surgeon: Eloise Harman, DO;  [facility];  Service: Endoscopy;  Laterality: N/A;  9:30 / ASA 3  (Pt was told that his time will be given at Pre-op)   ESOPHAGOGASTRODUODENOSCOPY (EGD) WITH PROPOFOL N/A 07/17/2018   Dr. Oneida Alar: Low-grade narrowing Schatzki ring at the GE junction status post dilation.  Gastritis.  Biopsy with mild nonspecific reactive gastropathy.  No H. pylori.   GIVENS CAPSULE STUDY N/A 06/24/2019   normal   HERNIA REPAIR  1986   Left inguinal   INTRAVASCULAR PRESSURE WIRE/FFR STUDY Left 06/08/2017   Procedure: INTRAVASCULAR PRESSURE WIRE/FFR STUDY;  Surgeon: Nelva Bush, MD;  Location: [place] [facility];  Service: Cardiovascular;  Laterality: Left;  LAD and CFX   LAPAROTOMY N/A 09/17/2018   Procedure: EXPLORATORY LAPAROTOMY;  Surgeon: Virl Cagey, MD;  Location: [facility];  Service: General;  Laterality: N/A;   LEFT HEART CATH AND CORONARY ANGIOGRAPHY N/A 06/08/2017   Procedure: LEFT HEART CATH AND CORONARY ANGIOGRAPHY;  Surgeon: Nelva Bush, MD;  Location: [place] [facility];  Service: Cardiovascular;  Laterality: N/A;   POLYPECTOMY  07/17/2018   Procedure: POLYPECTOMY;  Surgeon: Danie Binder, MD;  [facility];  Service: Endoscopy;;  colon   POLYPECTOMY  06/07/2021    Procedure: POLYPECTOMY INTESTINAL;  Surgeon: Eloise Harman, DO;  [facility];  Service: Endoscopy;;   ROTATOR CUFF REPAIR     Right   SAVORY DILATION N/A 07/17/2018   Procedure: SAVORY DILATION;  Surgeon: Danie Binder, MD;  [facility];  Service: Endoscopy;  Laterality: N/A;   Patient Active Problem List   Diagnosis Date Noted   Debility 10/21/2021   Acute respiratory failure ([place]) 08/25/2021   Sundowning 08/23/2021   Dysphagia 08/22/2021   Hypernatremia 08/22/2021   Obesity (BMI 30-39.9) 08/21/2021   Endotracheally intubated    Influenza    Acute respiratory failure with hypoxemia ([place]) 08/06/2021   Ventral hernia 12/31/2020   Nausea with vomiting 12/31/2019   Dyslipidemia, goal LDL below 70 06/18/2019   Hx of adenomatous colonic polyps 10/17/2018   Anemia 10/17/2018   Phimosis of penis 09/27/2018   Primary osteoarthritis of right hip 06/20/2018   Esophageal dysphagia 05/21/2018   GERD (gastroesophageal reflux disease) 05/21/2018   RLS (restless legs syndrome)    PUD (peptic ulcer disease)    Insomnia    Chronic diastolic CHF (congestive heart failure) ([place])    Asthma    Arteriosclerotic cardiovascular disease (ASCVD)    Chronic anticoagulation 01/27/2017   Depression 09/24/2015   COPD (chronic obstructive pulmonary disease) ([place]) 07/20/2015   Accelerating angina ([place]) 07/19/2015   Genetic testing 09/09/2014   H/O adenomatous polyp of colon 08/11/2014   Non-insulin treated type 2 diabetes mellitus ([place]) 02/09/2011   CAD S/P percutaneous coronary angioplasty    Tobacco abuse    Essential hypertension    Chronic atrial fibrillation ([place]) 02/23/2010   Obstructive sleep apnea 12/27/2009    REFERRING DIAG: M16.11 (ICD-10-CM) - Primary osteoarthritis of right hip R53.81 (ICD-10-CM) - Debility    THERAPY DIAG:  Pain in right hip  Other abnormalities of gait and mobility  Muscle weakness (generalized)  Other symptoms and signs involving  the musculoskeletal system  Debility  PERTINENT HISTORY: Pneumonia 12/22, afib, COPD, CHF, DM, anxiety, depression  PRECAUTIONS: Fall  SUBJECTIVE: Leg is doing alright, back is feeling better.   PAIN:  Are you having pain? None in hip, back soreness  Pain description: sore Aggravating factors: movement, sleep position Relieving factors: recliner    OBJECTIVE:    Below in italics are FROM EVALUATION =    PATIENT SURVEYS:  FOTO 42% function; 12/14/21 57% function   OBSERVATION:  Ambulates without AD, unsteady with wide BOS, antalgic on L    POSTURE:  Frequently slouched with lateral lean in seated    PALPATION: TTP R glute med/min and max   LE ROM: decreased R hip abd and extension > L    Active ROM Right 11/11/2021 Left 11/11/2021  Hip flexion      Hip extension      Hip  abduction      Hip adduction      Hip internal rotation      Hip external rotation      Knee flexion      Knee extension      Ankle dorsiflexion      Ankle plantarflexion      Ankle inversion      Ankle eversion       (Blank rows = not tested)   LE MMT: extension not tested as patient unable to lay prone   MMT Right 11/11/2021 Left 11/11/2021 Right 12/14/21 Left  12/14/21  Hip flexion 4/5 4+/5 5/5 5/5  Hip extension        Hip abduction 3/5 3/5 3+/5 4-/5  Hip adduction        Hip internal rotation        Hip external rotation        Knee flexion 4+/5 5/5 5/5 5/5  Knee extension 4+/5 5/5 5/5 5/5  Ankle dorsiflexion 5/5 5/5    Ankle plantarflexion        Ankle inversion        Ankle eversion         (Blank rows = not tested)       FUNCTIONAL TESTS:  5 times sit to stand: 13.15 with heavy bilateral UE support 2 minute walk test: 345 feet Transfers: Bilateral UE use, labored  12/14/21 5 times sit to stand: 13.97 seconds without UE supports 2 minute walk test: 395 feet Transfers: without UE support   GAIT: Distance walked: 395 feet Assistive device utilized: None Level of  assistance: Modified independence Comments: 2MWT, antalgic, decreased R hip extension, slight trunk flexion for limited r hip extension, antalgic       TODAY'S TREATMENT: 01/20/22 Gait trainer 1.4 MPH step length 58cm 5 minutes for warm up with cueing for step length PRN Standing hip openers with b/l UE support x5 b/l 20 second holds Left Standing Lateral Shift Correction at Wall 2 x 10; last set with self overpressure TKE 4 plates 2x 10 5 second holds Hip slide outs - 3 way  - 2x 5 bilateral  Shoulder ext; same time and alt 2x15 each on foam red thera band Scapular retraction on foam red thera band 2x15 12" step ups 1 x 10 each no UE assist   01/18/22 Gait trainer 1.4 MPH step length 58cm 6 minutes for warm up with cueing for step length PRN Hamstring stretch on 12" box 3 x 30"  Quadruped fire hydrants 2x 10 RTB around knees bilateral Quadruped hip extension 2x 10 with RTB around knees bilateral  Bridge with legs on ball 2 x 10 Hip abduction and extension floor slides 2 x 10 each leg Left Standing Lateral Shift Correction at Wall 2 x 10; last set with self overpressure Ball squats 2 x 10 Shoulder ext; same time and alt x 10 each on foam red thera band Scapular retraction on foam red thera band 12" step ups 2 x 10 each no UE assist  01/12/22 Gait trainer 1.4 MPH step length 58cm 6 minutes for warm up with cueing for step length PRN Standing hip openers with b/l UE support x5 b/l 20 second holds Quadruped fire hydrants 2x 10 5 second holds bilateral Quadruped hip extension 2x 10 with 5 second holds bilateral  Farmer carry 10# 3x 10 feet Lateral side glides to L 3x 10  Palof press with feet in tandem 2x 15 bilateral green band Lateral step down  7 inch step 2x 10 bilateral     01/06/22: TM 3% elevation 1.7 mph, lateral no elevation .65mh Farmer's carry 10# 2RT eachUE Proper lifting 10# 10x Resisted walks with dark green thick sports band ant and retro 80' x3 each  direction Standing hip openers with b/l UE support x10 b/l  Tandem stance on foam 1x 30", paloff in tandem stance on foam 4x 10  Quadruped: UE/LE 10x 5"  Cat/camel 10x 10"  Fire hydrant 10x  Child's pose 3x 30"          PATIENT EDUCATION:  Education details: HEP update and determining deficits that remain Person educated: Patient Education method: EConsulting civil engineer Demonstration Education comprehension: verbalized understanding and returned demonstration     HOME EXERCISE PROGRAM:   Access Code: [email or web address]  Clamshell - 2 x daily - 7 x weekly - 2 sets - 10 reps Supine Bridge - 2 x daily - 7 x weekly - 2 sets - 10 reps Supine Lower Trunk Rotation - 2 x daily - 7 x weekly - 2 sets - 10 reps Standing Hip Abduction - 2 x daily - 7 x weekly - 2 sets - 10 reps 3/22: tandem stance, 3D hip excursion, heel/toe raises 3/30 Squat, lunge 4/27: 3D hip excursion 12/28/21: RTB theraband shoulder extension and row 5/5: quadruped UE then LE, sidestep GTB 5/9: quadruped cat/camel  01/06/22: Child's pose 3x30" 5/17 - Left Standing Lateral Shift Correction at Wall      ASSESSMENT:   CLINICAL IMPRESSION: Began session on treadmill gait trainer for improving gait mechanics with cueing for equal step length. Patient continues to ambulate with decreased hip extension with RLE held in slight flexed position during gait cycle. Continued with hip mobility and hip/core strengthening which are tolerated we. Discussed and Educated on finding remaining deficits. Will reassess next session. Patient will continue to benefit from physical therapy in order to improve function and reduce impairment.    OBJECTIVE IMPAIRMENTS Abnormal gait, decreased activity tolerance, decreased balance, decreased endurance, decreased knowledge of use of DME, decreased mobility, difficulty walking, decreased ROM, decreased strength, impaired flexibility, improper body mechanics, and pain.    ACTIVITY LIMITATIONS cleaning,  community activity, meal prep, yard work, shopping, and yard work.    PERSONAL FACTORS Fitness, Time since onset of injury/illness/exacerbation, and 3+ comorbidities: Pneumonia 12/22, afib, COPD, CHF, DM, anxiety, depression  are also affecting patient's functional outcome.      REHAB POTENTIAL: Good   CLINICAL DECISION MAKING: Stable/uncomplicated   EVALUATION COMPLEXITY: Low     GOALS: Goals reviewed with patient? Yes   SHORT TERM GOALS: Target date:  12/02/21   Patient will be independent with HEP in order to improve functional outcomes. Baseline:  Goal status: Met   2.  Patient will report at least 25% improvement in symptoms for improved quality of life. Baseline:  Goal status: Met       LONG TERM GOALS: Target date: 12/23/21   Patient will report at least 75% improvement in symptoms for improved quality of life. Baseline:  Goal status: ONGOING   2.  Patient will be able to complete 5x STS in under 11.4 seconds without UE support in order to reduce the risk of falls. Baseline:  Goal status:ONGOING   3.  Patient will be able to ambulate at least 400 feet in 2MWT in order to demonstrate improved gait speed for community ambulation.  Baseline:  Goal status: ONGOING   4.  Patient will demonstrate grade of 5/5 MMT grade in  all tested musculature as evidence of improved strength to assist with stair ambulation and gait.   Baseline:  Goal status: ONGOING   5.  Patient will report ability to ambulate for  at least 20 minutes with least restrictive assistive device with no greater than 3/10 pain. Baseline:  Goal status: MET         PLAN: PT FREQUENCY: 2x/week   PT DURATION: 6 weeks   PLANNED INTERVENTIONS: Therapeutic exercises, Therapeutic activity, Neuromuscular re-education, Balance training, Gait training, Patient/Family education, Joint manipulation, Joint mobilization, Stair training, Orthotic/Fit training, DME instructions, Aquatic Therapy, Dry Needling,  Electrical stimulation, Spinal manipulation, Spinal mobilization, Cryotherapy, Moist heat, Compression bandaging, scar mobilization, Splintting, Taping, Traction, Ultrasound, Ionotophoresis 64m/ml Dexamethasone, and Manual therapy     PLAN FOR NEXT SESSION: Continue glute and  functional strengthening as able, Add more gait, standing and balance training, progress as tolerated. Reassess  7:46 AM, 01/20/22 AMearl LatinPT, DPT Physical [facility]rap[facility]     .

## 2022-01-25 ENCOUNTER — Ambulatory Visit (HOSPITAL_COMMUNITY): Payer: Medicare Other | Attending: Physical Medicine & Rehabilitation | Admitting: Physical Therapy

## 2022-01-25 ENCOUNTER — Encounter (HOSPITAL_COMMUNITY): Payer: Self-pay | Admitting: Physical Therapy

## 2022-01-25 ENCOUNTER — Other Ambulatory Visit (HOSPITAL_BASED_OUTPATIENT_CLINIC_OR_DEPARTMENT_OTHER): Payer: Self-pay

## 2022-01-25 DIAGNOSIS — R29898 Other symptoms and signs involving the musculoskeletal system: Secondary | ICD-10-CM

## 2022-01-25 DIAGNOSIS — M1611 Unilateral primary osteoarthritis, right hip: Secondary | ICD-10-CM | POA: Insufficient documentation

## 2022-01-25 DIAGNOSIS — M25551 Pain in right hip: Secondary | ICD-10-CM

## 2022-01-25 DIAGNOSIS — R5381 Other malaise: Secondary | ICD-10-CM

## 2022-01-25 DIAGNOSIS — M6281 Muscle weakness (generalized): Secondary | ICD-10-CM

## 2022-01-25 DIAGNOSIS — R2689 Other abnormalities of gait and mobility: Secondary | ICD-10-CM

## 2022-01-25 NOTE — Therapy (Signed)
OUTPATIENT PHYSICAL THERAPY TREATMENT NOTE   Patient Name: Shaun [name] MRN: [id number] DOB:Feb 22, 1956, 66 y.o., male Today's Date: 01/25/2022  PCP: Maximiano Coss, NP REFERRING PROVIDER: Charlett Blake, MD  Progress Note   Reporting Period 12/14/21 to 01/25/22   See note below for Objective Data and Assessment of Progress/Goals   PHYSICAL THERAPY DISCHARGE SUMMARY  Visits from Start of Care: 20   Current functional level related to goals / functional outcomes: See below   Remaining deficits: See below   Education / Equipment: See below   Patient agrees to discharge. Patient goals were met. Patient is being discharged due to meeting the stated rehab goals.    PT End of Session - 01/25/22 0739     Visit Number 20    Number of Visits 24    Date for PT Re-Evaluation 01/25/22    Authorization Type [employer] Medicare    Progress Note Due on Visit 22    PT Start Time 0745    PT Stop Time 0813    PT Time Calculation (min) 28 min    Activity Tolerance Patient tolerated treatment well    Behavior During Therapy WFL for tasks assessed/performed              Past Medical History:  Diagnosis Date   Anemia    Anxiety    Asthma    CAD (coronary artery disease)    DES to distal circumflex 2016   Cataract    Colon polyps    30 colon polyps found on first colonoscopy   Diastolic heart failure (HCC)    Diverticulitis    DJD (degenerative joint disease)    GERD (gastroesophageal reflux disease)    History of kidney stones    Hyperlipidemia    Hypertension    Insomnia    Obstructive sleep apnea 12/2009   01/26/2010 AHI 83/hr   Permanent atrial fibrillation ([place])    Onset 2006 paroxysmal then progressive to persistent   PUD (peptic ulcer disease)    1980s   RLS (restless legs syndrome)    Sinusitis    Skin cancer    Type 2 diabetes mellitus ([place])    Past Surgical History:  Procedure Laterality Date   BIOPSY  07/17/2018   Procedure: BIOPSY;  Surgeon:  Danie Binder, MD;  [facility];  Service: Endoscopy;;  colon   BOWEL RESECTION  09/17/2018   SMALL BOWEL RESECTION: 71 CM    CARDIAC CATHETERIZATION N/A 07/21/2015   Procedure: Left Heart Cath and Coronary Angiography;  Surgeon: Peter M Martinique, MD;  Location: [facility];  Service: Cardiovascular;  Laterality: N/A;   CARDIAC CATHETERIZATION N/A 07/21/2015   Procedure: Coronary Stent Intervention;  Surgeon: Peter M Martinique, MD;  Location: [facility];  Service: Cardiovascular;  Laterality: N/A;   CIRCUMCISION N/A 04/05/2019   Procedure: CIRCUMCISION ADULT;  Surgeon: Irine Seal, MD;  Location: [facility];  Service: Urology;  Laterality: N/A;   COLONOSCOPY N/A 05/19/2014   Dr. Barnie Alderman diverticulosis/moderate external hemorrhoids, >20 simple adenomas. Genetic screening negative.    COLONOSCOPY WITH PROPOFOL N/A 07/17/2018   Dr. Oneida Alar: Diverticulosis, external/internal hemorrhoids, 32 colon polyps removed.  ten tubular adenomas removed with no high-grade dysplasia.  Advised to have surveillance colonoscopy in 3 years.   COLONOSCOPY WITH PROPOFOL N/A 06/07/2021   Procedure: COLONOSCOPY WITH PROPOFOL;  Surgeon: Eloise Harman, DO;  [facility];  Service: Endoscopy;  Laterality: N/A;  9:30 / ASA 3  (  Pt was told that his time will be given at Pre-op)   ESOPHAGOGASTRODUODENOSCOPY (EGD) WITH PROPOFOL N/A 07/17/2018   Dr. Oneida Alar: Low-grade narrowing Schatzki ring at the GE junction status post dilation.  Gastritis.  Biopsy with mild nonspecific reactive gastropathy.  No H. pylori.   GIVENS CAPSULE STUDY N/A 06/24/2019   normal   HERNIA REPAIR  1986   Left inguinal   INTRAVASCULAR PRESSURE WIRE/FFR STUDY Left 06/08/2017   Procedure: INTRAVASCULAR PRESSURE WIRE/FFR STUDY;  Surgeon: Nelva Bush, MD;  Location: [facility];  Service: Cardiovascular;  Laterality: Left;  LAD and CFX   LAPAROTOMY N/A 09/17/2018   Procedure: EXPLORATORY LAPAROTOMY;  Surgeon:  Virl Cagey, MD;  Location: [facility];  Service: General;  Laterality: N/A;   LEFT HEART CATH AND CORONARY ANGIOGRAPHY N/A 06/08/2017   Procedure: LEFT HEART CATH AND CORONARY ANGIOGRAPHY;  Surgeon: Nelva Bush, MD;  Location: [facility];  Service: Cardiovascular;  Laterality: N/A;   POLYPECTOMY  07/17/2018   Procedure: POLYPECTOMY;  Surgeon: Danie Binder, MD;  [facility];  Service: Endoscopy;;  colon   POLYPECTOMY  06/07/2021   Procedure: POLYPECTOMY INTESTINAL;  Surgeon: Eloise Harman, DO;  [facility];  Service: Endoscopy;;   ROTATOR CUFF REPAIR     Right   SAVORY DILATION N/A 07/17/2018   Procedure: SAVORY DILATION;  Surgeon: Danie Binder, MD;  [facility];  Service: Endoscopy;  Laterality: N/A;   Patient Active Problem List   Diagnosis Date Noted   Debility 10/21/2021   Acute respiratory failure ([place]) 08/25/2021   Sundowning 08/23/2021   Dysphagia 08/22/2021   Hypernatremia 08/22/2021   Obesity (BMI 30-39.9) 08/21/2021   Endotracheally intubated    Influenza    Acute respiratory failure with hypoxemia ([place]) 08/06/2021   Ventral hernia 12/31/2020   Nausea with vomiting 12/31/2019   Dyslipidemia, goal LDL below 70 06/18/2019   Hx of adenomatous colonic polyps 10/17/2018   Anemia 10/17/2018   Phimosis of penis 09/27/2018   Primary osteoarthritis of right hip 06/20/2018   Esophageal dysphagia 05/21/2018   GERD (gastroesophageal reflux disease) 05/21/2018   RLS (restless legs syndrome)    PUD (peptic ulcer disease)    Insomnia    Chronic diastolic CHF (congestive heart failure) ([place])    Asthma    Arteriosclerotic cardiovascular disease (ASCVD)    Chronic anticoagulation 01/27/2017   Depression 09/24/2015   COPD (chronic obstructive pulmonary disease) ([place]) 07/20/2015   Accelerating angina ([place]) 07/19/2015   Genetic testing 09/09/2014   H/O adenomatous polyp of colon 08/11/2014   Non-insulin treated type 2  diabetes mellitus ([place]) 02/09/2011   CAD S/P percutaneous coronary angioplasty    Tobacco abuse    Essential hypertension    Chronic atrial fibrillation ([place]) 02/23/2010   Obstructive sleep apnea 12/27/2009    REFERRING DIAG: M16.11 (ICD-10-CM) - Primary osteoarthritis of right hip R53.81 (ICD-10-CM) - Debility    THERAPY DIAG:  Pain in right hip  Other abnormalities of gait and mobility  Muscle weakness (generalized)  Other symptoms and signs involving the musculoskeletal system  Debility  PERTINENT HISTORY: Pneumonia 12/22, afib, COPD, CHF, DM, anxiety, depression  PRECAUTIONS: Fall  SUBJECTIVE: Feeling lightheaded today, BP a little low this morning. Drank water but didn't take meds yet since it was so low. Patient states continued limitation with bending over due to hip and back but it is better. About 100% improvement compared to how he was.   PAIN:  Are you having pain? None in hip, back soreness  Pain description: sore Aggravating factors: movement, sleep position Relieving factors: recliner    OBJECTIVE:    Below in italics are FROM EVALUATION =    PATIENT SURVEYS:  FOTO 42% function; 12/14/21 57% function 01/25/22: 67% function   OBSERVATION:  Ambulates without AD, unsteady with wide BOS, antalgic on L    POSTURE:  Frequently slouched with lateral lean in seated    PALPATION: TTP R glute med/min and max   LE ROM: decreased R hip abd and extension > L    Active ROM Right 11/11/2021 Left 11/11/2021  Hip flexion      Hip extension      Hip abduction      Hip adduction      Hip internal rotation      Hip external rotation      Knee flexion      Knee extension      Ankle dorsiflexion      Ankle plantarflexion      Ankle inversion      Ankle eversion       (Blank rows = not tested)   LE MMT: extension not tested as patient unable to lay prone   MMT Right 11/11/2021 Left 11/11/2021 Right 12/14/21 Left  12/14/21 Right 01/25/22 Left 01/25/22  Hip  flexion 4/5 4+/5 5/5 5/5 5/5 5/5  Hip extension          Hip abduction 3/5 3/5 3+/5 4-/5 5/5 5/5  Hip adduction          Hip internal rotation          Hip external rotation          Knee flexion 4+/5 5/5 5/5 5/5 5/5 5/5  Knee extension 4+/5 5/5 5/5 5/5 5/5 5/5  Ankle dorsiflexion 5/5 5/5      Ankle plantarflexion          Ankle inversion          Ankle eversion           (Blank rows = not tested)       FUNCTIONAL TESTS:  5 times sit to stand: 13.15 with heavy bilateral UE support 2 minute walk test: 345 feet Transfers: Bilateral UE use, labored  Reassessment 12/14/21 5 times sit to stand: 13.97 seconds without UE supports 2 minute walk test: 395 feet Transfers: without UE support  Reassessment 01/25/22 5 times sit to stand: 10.41 seconds without UE supports 2 minute walk test: 490 feet Transfers: without UE support   GAIT: 01/25/22 Distance walked: 490 feet Assistive device utilized: None Level of assistance: Modified independence Comments: 2MWT, antalgic, decreased R hip extension, slight trunk flexion for limited r hip, decreased L step length       TODAY'S TREATMENT: 01/25/22 BP 118/81 Gait trainer 1.4 MPH step length 58cm 5 minutes for warm up with cueing for step length PRN Reassessment and discussed POC   01/20/22 Gait trainer 1.4 MPH step length 58cm 5 minutes for warm up with cueing for step length PRN Standing hip openers with b/l UE support x5 b/l 20 second holds Left Standing Lateral Shift Correction at Wall 2 x 10; last set with self overpressure TKE 4 plates 2x 10 5 second holds Hip slide outs - 3 way  - 2x 5 bilateral  Shoulder ext; same time and alt 2x15 each on foam red thera band Scapular retraction on foam red thera band 2x15 12" step ups 1 x  10 each no UE assist   01/18/22 Gait trainer 1.4 MPH step length 58cm 6 minutes for warm up with cueing for step length PRN Hamstring stretch on 12" box 3 x 30"  Quadruped fire hydrants 2x 10 RTB around  knees bilateral Quadruped hip extension 2x 10 with RTB around knees bilateral  Bridge with legs on ball 2 x 10 Hip abduction and extension floor slides 2 x 10 each leg Left Standing Lateral Shift Correction at Wall 2 x 10; last set with self overpressure Ball squats 2 x 10 Shoulder ext; same time and alt x 10 each on foam red thera band Scapular retraction on foam red thera band 12" step ups 2 x 10 each no UE assist       PATIENT EDUCATION:  Education details: HEP update and determining deficits that remain, POC, returning to PT if needed Person educated: Patient Education method: Consulting civil engineer, Demonstration Education comprehension: verbalized understanding and returned demonstration     HOME EXERCISE PROGRAM:  Access Code: [email or web address] Clamshell - 2 x daily - 7 x weekly - 2 sets - 10 reps Supine Bridge - 2 x daily - 7 x weekly - 2 sets - 10 reps Supine Lower Trunk Rotation - 2 x daily - 7 x weekly - 2 sets - 10 reps Standing Hip Abduction - 2 x daily - 7 x weekly - 2 sets - 10 reps 3/22: tandem stance, 3D hip excursion, heel/toe raises 3/30 Squat, lunge 4/27: 3D hip excursion 12/28/21: RTB theraband shoulder extension and row 5/5: quadruped UE then LE, sidestep GTB 5/9: quadruped cat/camel  01/06/22: Child's pose 3x30" 5/17 - Left Standing Lateral Shift Correction at [place]:   CLINICAL IMPRESSION: Patient ambulating with increased antalgic gait and decreased R hip extension today compared to prior sessions. Took BP at 118/81 as patient was having some lightheadedness this morning but patient wishing to participate in session. Began session on treadmill gait trainer for improving gait mechanics with cueing for equal step length. Patient has met all short and long term goals with ability to complete HEP and improvement in symptoms, strength, gait, functional mobility, and activity tolerance. He remains limited by intermittent hip and back symptoms, decreased hip  abduction/extension ROM/strength, and gait mechanics although they have improved greatly since beginning therapy. Patient educated on progress and discussed POC. Patient agreeable to d/c and return to PT if needed.Patient discharged from PT at this time.    OBJECTIVE IMPAIRMENTS Abnormal gait, decreased activity tolerance, decreased balance, decreased endurance, decreased knowledge of use of DME, decreased mobility, difficulty walking, decreased ROM, decreased strength, impaired flexibility, improper body mechanics, and pain.    ACTIVITY LIMITATIONS cleaning, community activity, meal prep, yard work, shopping, and yard work.    PERSONAL FACTORS Fitness, Time since onset of injury/illness/exacerbation, and 3+ comorbidities: Pneumonia 12/22, afib, COPD, CHF, DM, anxiety, depression  are also affecting patient's functional outcome.      REHAB POTENTIAL: Good   CLINICAL DECISION MAKING: Stable/uncomplicated   EVALUATION COMPLEXITY: Low     GOALS: Goals reviewed with patient? Yes   SHORT TERM GOALS: Target date:  12/02/21   Patient will be independent with HEP in order to improve functional outcomes. Baseline:  Goal status: Met   2.  Patient will report at least 25% improvement in symptoms for improved quality of life. Baseline:  Goal status: Met       LONG TERM GOALS: Target date: 12/23/21   Patient will report at least  75% improvement in symptoms for improved quality of life. Baseline:  Goal status: MET   2.  Patient will be able to complete 5x STS in under 11.4 seconds without UE support in order to reduce the risk of falls. Baseline: 5/30: 10.4 seconds without UE support Goal status:MET   3.  Patient will be able to ambulate at least 400 feet in 2MWT in order to demonstrate improved gait speed for community ambulation.  Baseline: 5/30 490 feet Goal status: MET   4.  Patient will demonstrate grade of 5/5 MMT grade in all tested musculature as evidence of improved strength to  assist with stair ambulation and gait.   Baseline: see MMT Goal status: MET   5.  Patient will report ability to ambulate for  at least 20 minutes with least restrictive assistive device with no greater than 3/10 pain. Baseline:  Goal status: MET         PLAN: PT FREQUENCY: n/a   PT DURATION: n/a   PLANNED INTERVENTIONS: Therapeutic exercises, Therapeutic activity, Neuromuscular re-education, Balance training, Gait training, Patient/Family education, Joint manipulation, Joint mobilization, Stair training, Orthotic/Fit training, DME instructions, Aquatic Therapy, Dry Needling, Electrical stimulation, Spinal manipulation, Spinal mobilization, Cryotherapy, Moist heat, Compression bandaging, scar mobilization, Splintting, Taping, Traction, Ultrasound, Ionotophoresis 68m/ml Dexamethasone, and Manual therapy     PLAN FOR NEXT SESSION: n/a  8:20 AM, 01/25/22 AMearl LatinPT, DPT [facility]     .

## 2022-01-26 ENCOUNTER — Telehealth: Payer: Self-pay | Admitting: *Deleted

## 2022-01-26 ENCOUNTER — Ambulatory Visit: Payer: Medicare Other | Admitting: Internal Medicine

## 2022-01-26 ENCOUNTER — Encounter: Payer: Self-pay | Admitting: Internal Medicine

## 2022-01-26 NOTE — Telephone Encounter (Signed)
Patient had appointment today with Dr. Abbey Chatters. He showed up but stated he thinks he has a stomach bug. He proceeded to the bathroom with vomiting and then he stated he couldn't stay for appointment and he left. Can you please reschedule patient? Thanks!

## 2022-01-27 ENCOUNTER — Encounter (HOSPITAL_COMMUNITY): Payer: Medicare Other | Admitting: Physical Therapy

## 2022-01-31 ENCOUNTER — Other Ambulatory Visit (HOSPITAL_BASED_OUTPATIENT_CLINIC_OR_DEPARTMENT_OTHER): Payer: Self-pay

## 2022-01-31 ENCOUNTER — Other Ambulatory Visit: Payer: Self-pay | Admitting: Gastroenterology

## 2022-01-31 MED ORDER — OMEPRAZOLE 40 MG PO CPDR
DELAYED_RELEASE_CAPSULE | ORAL | 3 refills | Status: DC
  Filled 2022-01-31: qty 90, 90d supply, fill #0
  Filled 2022-05-04 – 2022-05-05 (×2): qty 90, 90d supply, fill #1
  Filled 2022-07-28: qty 90, 90d supply, fill #2
  Filled 2022-10-26: qty 90, 90d supply, fill #3

## 2022-02-01 ENCOUNTER — Encounter (HOSPITAL_COMMUNITY): Payer: Medicare Other | Admitting: Physical Therapy

## 2022-02-02 ENCOUNTER — Ambulatory Visit (INDEPENDENT_AMBULATORY_CARE_PROVIDER_SITE_OTHER): Payer: Medicare Other

## 2022-02-02 DIAGNOSIS — Z0000 Encounter for general adult medical examination without abnormal findings: Secondary | ICD-10-CM

## 2022-02-02 NOTE — Progress Notes (Signed)
Subjective:   Shaun [name] is a 66 y.o. male who presents for an Initial Medicare Annual Wellness Visit.   I connected with Shaun [name]  today by telephone and verified that I am speaking with the correct person using two identifiers. Location patient: home Location provider: work Persons participating in the virtual visit: patient, provider.   I discussed the limitations, risks, security and privacy concerns of performing an evaluation and management service by telephone and the availability of in person appointments. I also discussed with the patient that there may be a patient responsible charge related to this service. The patient expressed understanding and verbally consented to this telephonic visit.    Interactive audio and video telecommunications were attempted between this provider and patient, however failed, due to patient having technical difficulties OR patient did not have access to video capability.  We continued and completed visit with audio only.    Review of Systems     Cardiac Risk Factors include: advanced age (>15mn, >>68women);diabetes mellitus;male gender;dyslipidemia     Objective:    Today's Vitals   There is no height or weight on file to calculate BMI.     02/02/2022    1:59 PM 11/11/2021   10:56 AM 11/04/2021    6:33 PM 10/21/2021   11:20 AM 09/22/2021    1:26 PM 08/25/2021    3:04 PM 08/07/2021    9:00 AM  Advanced Directives  Does Patient Have a Medical Advance Directive? Yes No No No No No   Does patient want to make changes to medical advance directive? No - Patient declined        Would patient like information on creating a medical advance directive?  No - Patient declined No - Patient declined  Yes (ED - Information included in AVS) No - Patient declined No - Patient declined    Current Medications (verified) Outpatient Encounter Medications as of 02/02/2022  Medication Sig   acetaminophen (TYLENOL) 325 MG tablet Take 2 tablets (650  mg total) by mouth every 6 (six) hours as needed for mild pain.   albuterol (VENTOLIN HFA) 108 (90 Base) MCG/ACT inhaler INHALE 2 PUFFS INTO THE LUNGS EVERY 6 HOURS AS NEEDED FOR WHEEZING OR SHORTNESS OF BREATH   apixaban (ELIQUIS) 5 MG TABS tablet TAKE 1 TABLET (5 MG) BY MOUTH TWICE DAILY   atorvastatin (LIPITOR) 80 MG tablet Take 1 tablet (80 mg total) by mouth daily. TAKE 1 TABLET(80 MG) BY MOUTH EVERY EVENING   azelastine (ASTELIN) 0.1 % nasal spray Place 1 spray into both nostrils 2 (two) times daily. Use in each nostril as directed   buPROPion (WELLBUTRIN XL) 150 MG 24 hr tablet Take 3 tablets once daily   docusate sodium (COLACE) 100 MG capsule Take 100 mg by mouth daily as needed for mild constipation.   escitalopram (LEXAPRO) 20 MG tablet TAKE 1 TABLET(20 MG) BY MOUTH DAILY   ezetimibe (ZETIA) 10 MG tablet TAKE 1 TABLET BY MOUTH EVERY DAY   ferrous sulfate 325 (65 FE) MG tablet Take 325 mg by mouth daily with breakfast.   furosemide (LASIX) 40 MG tablet Take 1 tablet (40 mg total) by mouth daily.   hydrOXYzine (ATARAX) 10 MG tablet Take 1 tablet (10 mg total) by mouth 2 (two) times daily as needed for itching.   magnesium gluconate (MAGONATE) 500 MG tablet Take 0.5 tablets (250 mg total) by mouth at bedtime.   metFORMIN (GLUCOPHAGE) 500 MG tablet TAKE 1 TABLET (500 MG) BY  MOUTH TWICE DAILY WITH A MEAL.   Metoprolol Tartrate 75 MG TABS Take 75 mg by mouth 2 (two) times daily.   nitroGLYCERIN (NITROSTAT) 0.4 MG SL tablet Place 1 tablet (0.4 mg total) under the tongue every 5 (five) minutes as needed.   omeprazole (PRILOSEC) 40 MG capsule Take 1 capsule by mouth once daily 30 minutes before breakfast   potassium chloride SA (KLOR-CON M) 20 MEQ tablet Take 1 tablet (20 mEq total) by mouth daily.   predniSONE (DELTASONE) 10 MG tablet 3 tabs x3 days and then 2 tabs x3 days and then 1 tab x3 days.  Take w/ food.   QUEtiapine (SEROQUEL) 50 MG tablet Take 1 tablet (50 mg total) by mouth at  bedtime.   rOPINIRole (REQUIP) 3 MG tablet TAKE 1 TABLET BY MOUTH EVERY NIGHT AT BEDTIME   sildenafil (VIAGRA) 100 MG tablet TAKE 1 TABLET BY MOUTH 30 MINUTES BEFORE ACTIVITY   sucralfate (CARAFATE) 1 g tablet Take one tablet po BID PRN (Patient taking differently: Take 1 g by mouth 2 (two) times daily as needed (acid reflux).)   traMADol (ULTRAM) 50 MG tablet TAKE 1 TABLET BY MOUTH TWICE DAILY AS NEEDED FOR MODERATE PAIN.   triamcinolone ointment (KENALOG) 0.1 % Apply 1 application. topically 2 (two) times daily.   amoxicillin-clavulanate (AUGMENTIN) 875-125 MG tablet Take 1 tablet by mouth 2 (two) times daily. (Patient not taking: Reported on 02/02/2022)   No facility-administered encounter medications on file as of 02/02/2022.    Allergies (verified) Patient has no known allergies.   History: Past Medical History:  Diagnosis Date   Anemia    Anxiety    Asthma    CAD (coronary artery disease)    DES to distal circumflex 2016   Cataract    Colon polyps    30 colon polyps found on first colonoscopy   Diastolic heart failure (HCC)    Diverticulitis    DJD (degenerative joint disease)    GERD (gastroesophageal reflux disease)    History of kidney stones    Hyperlipidemia    Hypertension    Insomnia    Obstructive sleep apnea 12/2009   01/26/2010 AHI 83/hr   Permanent atrial fibrillation ([place])    Onset 2006 paroxysmal then progressive to persistent   PUD (peptic ulcer disease)    1980s   RLS (restless legs syndrome)    Sinusitis    Skin cancer    Type 2 diabetes mellitus ([place])    Past Surgical History:  Procedure Laterality Date   BIOPSY  07/17/2018   Procedure: BIOPSY;  Surgeon: Danie Binder, MD;  [facility];  Service: Endoscopy;;  colon   BOWEL RESECTION  09/17/2018   SMALL BOWEL RESECTION: 71 CM    CARDIAC CATHETERIZATION N/A 07/21/2015   Procedure: Left Heart Cath and Coronary Angiography;  Surgeon: Peter M Martinique, MD;  Location: [facility];   Service: Cardiovascular;  Laterality: N/A;   CARDIAC CATHETERIZATION N/A 07/21/2015   Procedure: Coronary Stent Intervention;  Surgeon: Peter M Martinique, MD;  Location: [facility];  Service: Cardiovascular;  Laterality: N/A;   CIRCUMCISION N/A 04/05/2019   Procedure: CIRCUMCISION ADULT;  Surgeon: Irine Seal, MD;  Location: [facility];  Service: Urology;  Laterality: N/A;   COLONOSCOPY N/A 05/19/2014   Dr. Barnie Alderman diverticulosis/moderate external hemorrhoids, >20 simple adenomas. Genetic screening negative.    COLONOSCOPY WITH PROPOFOL N/A 07/17/2018   Dr. Oneida Alar: Diverticulosis, external/internal hemorrhoids, 32 colon polyps removed.  ten tubular  adenomas removed with no high-grade dysplasia.  Advised to have surveillance colonoscopy in 3 years.   COLONOSCOPY WITH PROPOFOL N/A 06/07/2021   Procedure: COLONOSCOPY WITH PROPOFOL;  Surgeon: Eloise Harman, DO;  [facility];  Service: Endoscopy;  Laterality: N/A;  9:30 / ASA 3  (Pt was told that his time will be given at Pre-op)   ESOPHAGOGASTRODUODENOSCOPY (EGD) WITH PROPOFOL N/A 07/17/2018   Dr. Oneida Alar: Low-grade narrowing Schatzki ring at the GE junction status post dilation.  Gastritis.  Biopsy with mild nonspecific reactive gastropathy.  No H. pylori.   GIVENS CAPSULE STUDY N/A 06/24/2019   normal   HERNIA REPAIR  1986   Left inguinal   INTRAVASCULAR PRESSURE WIRE/FFR STUDY Left 06/08/2017   Procedure: INTRAVASCULAR PRESSURE WIRE/FFR STUDY;  Surgeon: Nelva Bush, MD;  Location: [facility];  Service: Cardiovascular;  Laterality: Left;  LAD and CFX   LAPAROTOMY N/A 09/17/2018   Procedure: EXPLORATORY LAPAROTOMY;  Surgeon: Virl Cagey, MD;  Location: [facility];  Service: General;  Laterality: N/A;   LEFT HEART CATH AND CORONARY ANGIOGRAPHY N/A 06/08/2017   Procedure: LEFT HEART CATH AND CORONARY ANGIOGRAPHY;  Surgeon: Nelva Bush, MD;  Location: [facility];  Service: Cardiovascular;  Laterality: N/A;    POLYPECTOMY  07/17/2018   Procedure: POLYPECTOMY;  Surgeon: Danie Binder, MD;  [facility];  Service: Endoscopy;;  colon   POLYPECTOMY  06/07/2021   Procedure: POLYPECTOMY INTESTINAL;  Surgeon: Eloise Harman, DO;  [facility];  Service: Endoscopy;;   ROTATOR CUFF REPAIR     Right   SAVORY DILATION N/A 07/17/2018   Procedure: SAVORY DILATION;  Surgeon: Danie Binder, MD;  [facility];  Service: Endoscopy;  Laterality: N/A;   Family History  Problem Relation Age of Onset   Hypertension Mother    Breast cancer Mother 10       brain/bone    Heart attack Father    Skin cancer Sister 69   Diabetes Brother    Parkinson's disease Brother    Brain cancer Maternal Uncle    Cancer Maternal Uncle        NOS   Breast cancer Cousin        maternal cousin dx <50   Cancer Cousin    Colon cancer Neg Hx    Social History   Socioeconomic History   Marital status: Married    Spouse name: [name] Salva   Number of children: 5   Years of education: 12   Highest education level: 12th grade  Occupational History   Occupation: employed    Fish farm manager: Education officer, [employer]    Comment: full-time  Tobacco Use   Smoking status: Former    Packs/day: 0.50    Years: 39.00    Pack years: 19.50    Types: Cigarettes    Start date: 07/24/1970    Quit date: 08/06/2021    Years since quitting: 0.4    Passive exposure: Past   Smokeless tobacco: Never  Vaping Use   Vaping Use: Never used  Substance and Sexual Activity   Alcohol use: No    Alcohol/week: 0.0 standard drinks   Drug use: No   Sexual activity: Yes    Partners: Female  Other Topics Concern   Not on file  Social History Narrative   Not on file   Social Determinants of Health   Financial Resource Strain: Low Risk    Difficulty of Paying Living Expenses: Not  hard at all  Food Insecurity: No Food Insecurity   Worried About Charity fundraiser in the Last Year: Never true   Ran Out of Food in the  Last Year: Never true  Transportation Needs: No Transportation Needs   Lack of Transportation (Medical): No   Lack of Transportation (Non-Medical): No  Physical Activity: Sufficiently Active   Days of Exercise per Week: 5 days   Minutes of Exercise per Session: 40 min  Stress: No Stress Concern Present   Feeling of Stress : Not at all  Social Connections: Moderately Isolated   Frequency of Communication with Friends and Family: Three times a week   Frequency of Social Gatherings with Friends and Family: Three times a week   Attends Religious Services: Never   Active Member of Clubs or Organizations: No   Attends Music therapist: Never   Marital Status: Married    Tobacco Counseling Counseling given: Not Answered   Clinical Intake:  Pre-visit preparation completed: Yes  Pain : No/denies pain     Nutritional Risks: None Diabetes: Yes CBG done?: No Did pt. bring in CBG monitor from home?: No  How often do you need to have someone help you when you read instructions, pamphlets, or other written materials from your doctor or pharmacy?: 1 - Never What is the last grade level you completed in school?: college  Diabetic?yes Nutrition Risk Assessment:  Has the patient had any N/V/D within the last 2 months?  No  Does the patient have any non-healing wounds?  No  Has the patient had any unintentional weight loss or weight gain?  No   Diabetes:  Is the patient diabetic?  Yes  If diabetic, was a CBG obtained today?  No  Did the patient bring in their glucometer from home?  No  How often do you monitor your CBG's? 3 x day .   Financial Strains and Diabetes Management:  Are you having any financial strains with the device, your supplies or your medication? No .  Does the patient want to be seen by Chronic Care Management for management of their diabetes?  No  Would the patient like to be referred to a Nutritionist or for Diabetic Management?  No   Diabetic  Exams:  Diabetic Eye Exam: Completed 10/2021 Diabetic Foot Exam: Overdue, Pt has been advised about the importance in completing this exam. Pt is scheduled for diabetic foot exam on next office visit .   Interpreter Needed?: No  Information entered by :: L.Raysha Tilmon,LPN   Activities of Daily Living    02/02/2022    2:01 PM 11/04/2021    6:29 PM  In your present state of health, do you have any difficulty performing the following activities:  Hearing? 0 0  Vision? 0 0  Difficulty concentrating or making decisions? 0 1  Comment  Sundowning  Walking or climbing stairs? 0 1  Comment  Unsteady Gait/Balance  Dressing or bathing? 0 0  Doing errands, shopping? 0 0  Preparing Food and eating ? N N  Using the Toilet? N N  In the past six months, have you accidently leaked urine? N N  Do you have problems with loss of bowel control? N N  Managing your Medications? N N  Managing your Finances? N N  Housekeeping or managing your Housekeeping? N N    Patient Care Team: Maximiano Coss, NP as PCP - General (Adult Health Nurse Practitioner) Lelon Perla, MD as PCP - Cardiology (Cardiology) Oneida Alar,  Marga Melnick, MD (Inactive) as Consulting Physician (Gastroenterology) Stanford Breed Denice Bors, MD as Consulting Physician (Cardiology) [facility] (Specialist) Dimitri Ped, RN as Case Manager [place], Maree Erie, LCSW as Social Worker (Licensed Clinical Social Worker)  Indicate any recent [employer] you may have received from other than [facility] providers in the past year (date may be approximate).     Assessment:   This is a routine wellness examination for Joeziah.  Hearing/Vision screen Vision Screening - Comments:: Annual eye exams wear glasses   Dietary issues and exercise activities discussed: Current Exercise Habits: Home exercise routine, Type of exercise: walking, Time (Minutes): 40, Frequency (Times/Week): 5, Weekly Exercise (Minutes/Week): 200, Intensity: Mild, Exercise limited  by: orthopedic condition(s)   Goals Addressed               This Visit's Progress     Reduce and Manage My Symptoms of Depression. (pt-stated)   On track     Timeframe:  Short-Term Goal Priority:  High Start Date:  11/04/2021                   Expected End Date:  02/04/2022              Patient Goals/Self-Care Activities: Continue to receive personal counseling with LCSW, on a bi-weekly basis, in an effort to reduce and manage symptoms of Depression, until well-controlled.   Continue to incorporate into daily practice - relaxation techniques, deep breathing exercises, and mindfulness meditation strategies. Continue to receive twice weekly outpatient physical therapy with Margie Billet, Physical Therapist at [facility], to improve strengthening, functioning, mobility, safety, balance, and ambulation techniques. Review list of [facility], mailed to your home by LCSW on 12/23/2021, in an effort to establish long-term counseling and supportive services. Review list of Therapists and Psychiatrists in [place], [place], mailed to your home by LCSW on 12/23/2021, in an effort to establish long-term counseling and supportive services. Review "12 Common Symptoms of Depression", mailed to your home by LCSW on 12/23/2021, and be prepared to discuss during our next scheduled telephone outreach call. Contact LCSW directly (# [phone]), if you have questions, need assistance, or if additional social work needs are identified in the near future.   Follow-Up:  01/06/2022 at 1:30 pm       Depression Screen    02/02/2022    1:59 PM 02/02/2022    1:57 PM 12/07/2021    1:11 PM 12/07/2021   12:58 PM 11/04/2021    6:23 PM 10/21/2021   11:20 AM 10/20/2021    8:33 AM  PHQ 2/9 Scores  PHQ - 2 Score 0 0 0 '2 2 2 4  '$ PHQ- 9 Score     15  18    Fall Risk    02/02/2022    1:59 PM 01/13/2022    1:27 PM 12/15/2021    2:12 PM 12/07/2021    1:11 PM 12/07/2021   12:58 PM   Fall Risk   Falls in the past year? 0 1 1 0 0  Number falls in past yr: 0 1 0 0   Comment   fell yesterday 12/14/21 working in yard    Injury with Fall? 0 0 1 0   Comment   scraped knee sore hip    Risk for fall due to :  History of fall(s) History of fall(s)    Follow up Falls evaluation completed Falls evaluation completed Education provided;Falls prevention discussed  FALL RISK PREVENTION PERTAINING TO THE HOME:  Any stairs in or around the home? No  If so, are there any without handrails? No  Home free of loose throw rugs in walkways, pet beds, electrical cords, etc? Yes  Adequate lighting in your home to reduce risk of falls? Yes   ASSISTIVE DEVICES UTILIZED TO PREVENT FALLS:  Life alert? No  Use of a cane, walker or w/c? No  Grab bars in the bathroom? Yes  Shower chair or bench in shower? Yes  Elevated toilet seat or a handicapped toilet? Yes    Cognitive Function:  Normal cognitive status assessed by telephone conversation  by this Nurse Health Advisor. No abnormalities found.        Immunizations Immunization History  Administered Date(s) Administered   Fluad Quad(high Dose 65+) 08/08/2021   Influenza Whole 05/14/2010   Influenza,inj,Quad PF,6+ Mos 06/30/2014, 06/18/2015, 05/17/2016, 05/11/2017, 06/21/2018, 07/01/2019, 05/26/2020   Influenza,inj,quad, With Preservative 05/29/2017   Influenza-Unspecified 05/30/2011   PFIZER(Purple Top)SARS-COV-2 Vaccination 11/11/2019, 12/12/2019, 07/30/2020   Pneumococcal Polysaccharide-23 08/29/2010, 08/08/2021   Td 02/24/2014   Zoster Recombinat (Shingrix) 03/30/2018    TDAP status: Up to date  Flu Vaccine status: Up to date  Pneumococcal vaccine status: Up to date  Covid-19 vaccine status: Completed vaccines  Qualifies for Shingles Vaccine? Yes   Zostavax completed Yes   Shingrix Completed?: Yes  Screening Tests Health Maintenance  Topic Date Due   Zoster Vaccines- Shingrix (2 of 2) 05/25/2018   COVID-19  Vaccine (4 - Booster for Pfizer series) 09/24/2020   URINE MICROALBUMIN  12/31/2020   FOOT EXAM  05/26/2021   Hepatitis C Screening  02/10/2022 (Originally 07/24/1974)   INFLUENZA VACCINE  03/29/2022   HEMOGLOBIN A1C  04/19/2022   OPHTHALMOLOGY EXAM  05/28/2022   Pneumonia Vaccine 45+ Years old (2 - PCV) 08/08/2022   TETANUS/TDAP  02/25/2024   COLONOSCOPY (Pts 45-47yr Insurance coverage will need to be confirmed)  06/07/2024   HIV Screening  Completed   HPV VACCINES  Aged Out    Health Maintenance  Health Maintenance Due  Topic Date Due   Zoster Vaccines- Shingrix (2 of 2) 05/25/2018   COVID-19 Vaccine (4 - Booster for Pfizer series) 09/24/2020   URINE MICROALBUMIN  12/31/2020   FOOT EXAM  05/26/2021    Colorectal cancer screening: Type of screening: Colonoscopy. Completed 06/07/2021. Repeat every 3 years  Lung Cancer Screening: (Low Dose CT Chest recommended if Age 66-80years, 30 pack-year currently smoking OR have quit w/in 15years.) does not qualify.   Lung Cancer Screening Referral: n/a  Additional Screening:  Hepatitis C Screening: does not qualify;   Vision Screening: Recommended annual ophthalmology exams for early detection of glaucoma and other disorders of the eye. Is the patient up to date with their annual eye exam?  Yes  Who is the provider or what is the name of the office in which the patient attends annual eye exams? Dr.Scott  If pt is not established with a provider, would they like to be referred to a provider to establish care? No .   Dental Screening: Recommended annual dental exams for proper oral hygiene  Community Resource Referral / Chronic Care Management: CRR required this visit?  No   CCM required this visit?  No      Plan:     I have personally reviewed and noted the following in the patient's chart:   Medical and social history Use of alcohol, tobacco or illicit drugs  Current medications and  supplements including opioid  prescriptions. Patient is not currently taking opioid prescriptions. Functional ability and status Nutritional status Physical activity Advanced directives List of other physicians Hospitalizations, surgeries, and ER visits in previous 12 months Vitals Screenings to include cognitive, depression, and falls Referrals and appointments  In addition, I have reviewed and discussed with patient certain preventive protocols, quality metrics, and best practice recommendations. A written personalized care plan for preventive services as well as general preventive health recommendations were provided to patient.     Randel Pigg, LPN   01/31/8005   Nurse Notes: none

## 2022-02-02 NOTE — Patient Instructions (Signed)
Shaun [name] , Thank you for taking time to come for your Medicare Wellness Visit. I appreciate your ongoing commitment to your health goals. Please review the following plan we discussed and let me know if I can assist you in the future.   Screening recommendations/referrals: Colonoscopy: 06/07/2021 Recommended yearly ophthalmology/optometry visit for glaucoma screening and checkup Recommended yearly dental visit for hygiene and checkup  Vaccinations: Influenza vaccine: completed  Pneumococcal vaccine: completed  Tdap vaccine: 02/24/2014 Shingles vaccine: completed     Advanced directives: none   Conditions/risks identified: none   Next appointment: none  Preventive Care 21 Years and Older, Male Preventive care refers to lifestyle choices and visits with your health care provider that can promote health and wellness. What does preventive care include? A yearly physical exam. This is also called an annual well check. Dental exams once or twice a year. Routine eye exams. Ask your health care provider how often you should have your eyes checked. Personal lifestyle choices, including: Daily care of your teeth and gums. Regular physical activity. Eating a healthy diet. Avoiding tobacco and drug use. Limiting alcohol use. Practicing safe sex. Taking low doses of aspirin every day. Taking vitamin and mineral supplements as recommended by your health care provider. What happens during an annual well check? The services and screenings done by your health care provider during your annual well check will depend on your age, overall health, lifestyle risk factors, and family history of disease. Counseling  Your health care provider may ask you questions about your: Alcohol use. Tobacco use. Drug use. Emotional well-being. Home and relationship well-being. Sexual activity. Eating habits. History of falls. Memory and ability to understand (cognition). Work and work  Statistician. Screening  You may have the following tests or measurements: Height, weight, and BMI. Blood pressure. Lipid and cholesterol levels. These may be checked every 5 years, or more frequently if you are over 81 years old. Skin check. Lung cancer screening. You may have this screening every year starting at age 20 if you have a 30-pack-year history of smoking and currently smoke or have quit within the past 15 years. Fecal occult blood test (FOBT) of the stool. You may have this test every year starting at age 43. Flexible sigmoidoscopy or colonoscopy. You may have a sigmoidoscopy every 5 years or a colonoscopy every 10 years starting at age 21. Prostate cancer screening. Recommendations will vary depending on your family history and other risks. Hepatitis C blood test. Hepatitis B blood test. Sexually transmitted disease (STD) testing. Diabetes screening. This is done by checking your blood sugar (glucose) after you have not eaten for a while (fasting). You may have this done every 1-3 years. Abdominal aortic aneurysm (AAA) screening. You may need this if you are a current or former smoker. Osteoporosis. You may be screened starting at age 38 if you are at high risk. Talk with your health care provider about your test results, treatment options, and if necessary, the need for more tests. Vaccines  Your health care provider may recommend certain vaccines, such as: Influenza vaccine. This is recommended every year. Tetanus, diphtheria, and acellular pertussis (Tdap, Td) vaccine. You may need a Td booster every 10 years. Zoster vaccine. You may need this after age 66. Pneumococcal 13-valent conjugate (PCV13) vaccine. One dose is recommended after age 44. Pneumococcal polysaccharide (PPSV23) vaccine. One dose is recommended after age 35. Talk to your health care provider about which screenings and vaccines you need and how often you need them.  This information is not intended to replace  advice given to you by your health care provider. Make sure you discuss any questions you have with your health care provider. Document Released: 09/11/2015 Document Revised: 05/04/2016 Document Reviewed: 06/16/2015 Elsevier Interactive Patient Education  2017 [employer] Prevention in the Home Falls can cause injuries. They can happen to people of all ages. There are many things you can do to make your home safe and to help prevent falls. What can I do on the outside of my home? Regularly fix the edges of walkways and driveways and fix any cracks. Remove anything that might make you trip as you walk through a door, such as a raised step or threshold. Trim any bushes or trees on the path to your home. Use bright outdoor lighting. Clear any walking paths of anything that might make someone trip, such as rocks or tools. Regularly check to see if handrails are loose or broken. Make sure that both sides of any steps have handrails. Any raised decks and porches should have guardrails on the edges. Have any leaves, snow, or ice cleared regularly. Use sand or salt on walking paths during winter. Clean up any spills in your garage right away. This includes oil or grease spills. What can I do in the bathroom? Use night lights. Install grab bars by the toilet and in the tub and shower. Do not use towel bars as grab bars. Use non-skid mats or decals in the tub or shower. If you need to sit down in the shower, use a plastic, non-slip stool. Keep the floor dry. Clean up any water that spills on the floor as soon as it happens. Remove soap buildup in the tub or shower regularly. Attach bath mats securely with double-sided non-slip rug tape. Do not have throw rugs and other things on the floor that can make you trip. What can I do in the bedroom? Use night lights. Make sure that you have a light by your bed that is easy to reach. Do not use any sheets or blankets that are too big for your bed.  They should not hang down onto the floor. Have a firm chair that has side arms. You can use this for support while you get dressed. Do not have throw rugs and other things on the floor that can make you trip. What can I do in the kitchen? Clean up any spills right away. Avoid walking on wet floors. Keep items that you use a lot in easy-to-reach places. If you need to reach something above you, use a strong step stool that has a grab bar. Keep electrical cords out of the way. Do not use floor polish or wax that makes floors slippery. If you must use wax, use non-skid floor wax. Do not have throw rugs and other things on the floor that can make you trip. What can I do with my stairs? Do not leave any items on the stairs. Make sure that there are handrails on both sides of the stairs and use them. Fix handrails that are broken or loose. Make sure that handrails are as long as the stairways. Check any carpeting to make sure that it is firmly attached to the stairs. Fix any carpet that is loose or worn. Avoid having throw rugs at the top or bottom of the stairs. If you do have throw rugs, attach them to the floor with carpet tape. Make sure that you have a light switch at the  top of the stairs and the bottom of the stairs. If you do not have them, ask someone to add them for you. What else can I do to help prevent falls? Wear shoes that: Do not have high heels. Have rubber bottoms. Are comfortable and fit you well. Are closed at the toe. Do not wear sandals. If you use a stepladder: Make sure that it is fully opened. Do not climb a closed stepladder. Make sure that both sides of the stepladder are locked into place. Ask someone to hold it for you, if possible. Clearly mark and make sure that you can see: Any grab bars or handrails. First and last steps. Where the edge of each step is. Use tools that help you move around (mobility aids) if they are needed. These  include: Canes. Walkers. Scooters. Crutches. Turn on the lights when you go into a dark area. Replace any light bulbs as soon as they burn out. Set up your furniture so you have a clear path. Avoid moving your furniture around. If any of your floors are uneven, fix them. If there are any pets around you, be aware of where they are. Review your medicines with your doctor. Some medicines can make you feel dizzy. This can increase your chance of falling. Ask your doctor what other things that you can do to help prevent falls. This information is not intended to replace advice given to you by your health care provider. Make sure you discuss any questions you have with your health care provider. Document Released: 06/11/2009 Document Revised: 01/21/2016 Document Reviewed: 09/19/2014 Elsevier Interactive Patient Education  2017 [employer].

## 2022-02-03 ENCOUNTER — Encounter (HOSPITAL_COMMUNITY): Payer: Medicare Other | Admitting: Physical Therapy

## 2022-02-03 ENCOUNTER — Telehealth: Payer: Self-pay | Admitting: *Deleted

## 2022-02-03 NOTE — Chronic Care Management (AMB) (Signed)
  Chronic Care Management Note  02/03/2022 Name: Shaun [name] MRN: [id number] DOB: Jul 09, 1956  Shaun [name] is a 66 y.o. year old male who is a primary care patient of Maximiano Coss, NP and is actively engaged with the care management team. I reached out to Leta Jungling by phone today to assist with re-scheduling a follow up visit with the Licensed Clinical Social Worker  Follow up plan: Unsuccessful telephone outreach attempt made. A HIPAA compliant phone message was left for the patient providing contact information and requesting a return call.   Julian Hy, [facility]  Direct Dial: [phone]

## 2022-02-08 ENCOUNTER — Encounter: Payer: Medicare Other | Attending: Physical Medicine & Rehabilitation | Admitting: Physical Medicine & Rehabilitation

## 2022-02-08 ENCOUNTER — Encounter: Payer: Self-pay | Admitting: Physical Medicine & Rehabilitation

## 2022-02-08 VITALS — BP 118/77 | HR 79 | Ht 67.0 in | Wt 224.6 lb

## 2022-02-08 DIAGNOSIS — M1611 Unilateral primary osteoarthritis, right hip: Secondary | ICD-10-CM | POA: Diagnosis not present

## 2022-02-08 DIAGNOSIS — M47816 Spondylosis without myelopathy or radiculopathy, lumbar region: Secondary | ICD-10-CM | POA: Diagnosis not present

## 2022-02-08 NOTE — Progress Notes (Signed)
Subjective:    Patient ID: Shaun [name], male    DOB: 06-Jan-1956, 66 y.o.   MRN: [id number]  HPI  RIght hip pain better after injection plus PT No is noting Low back pain on both sides , maybe worse on RIght.  Pain increased with walking, no pain shooting down the leg.   Patient does not have much pain with bending forward but when he straightens back out it does hurt. He has had no falls or new trauma.  No bowel or bladder dysfunction.  No recent infections. Pain Inventory Average Pain 5 Pain Right Now 5 My pain is aching  In the last 24 hours, has pain interfered with the following? General activity 3 Relation with others 3 Enjoyment of life 3 What TIME of day is your pain at its worst? morning  and evening Sleep (in general) Fair  Pain is worse with: sitting, inactivity, and standing Pain improves with: medication Relief from Meds: 3  Family History  Problem Relation Age of Onset   Hypertension Mother    Breast cancer Mother 60       brain/bone    Heart attack Father    Skin cancer Sister 65   Diabetes Brother    Parkinson's disease Brother    Brain cancer Maternal Uncle    Cancer Maternal Uncle        NOS   Breast cancer Cousin        maternal cousin dx <50   Cancer Cousin    Colon cancer Neg Hx    Social History   Socioeconomic History   Marital status: Married    Spouse name: [name] Rayson   Number of children: 5   Years of education: 12   Highest education level: 12th grade  Occupational History   Occupation: employed    Fish farm manager: Education officer, [employer]    Comment: full-time  Tobacco Use   Smoking status: Former    Packs/day: 0.50    Years: 39.00    Total pack years: 19.50    Types: Cigarettes    Start date: 07/24/1970    Quit date: 08/06/2021    Years since quitting: 0.5    Passive exposure: Past   Smokeless tobacco: Never  Vaping Use   Vaping Use: Never used  Substance and Sexual Activity   Alcohol use: No    Alcohol/week: 0.0 standard drinks  of alcohol   Drug use: No   Sexual activity: Yes    Partners: Female  Other Topics Concern   Not on file  Social History Narrative   Not on file   Social Determinants of Health   Financial Resource Strain: Low Risk  (02/02/2022)   Overall Financial Resource Strain (CARDIA)    Difficulty of Paying Living Expenses: Not hard at all  Food Insecurity: No Food Insecurity (02/02/2022)   Hunger Vital Sign    Worried About Running Out of Food in the Last Year: Never true    [place] in the Last Year: Never true  Transportation Needs: No Transportation Needs (02/02/2022)   PRAPARE - Hydrologist (Medical): No    Lack of Transportation (Non-Medical): No  Physical Activity: Sufficiently Active (02/02/2022)   Exercise Vital Sign    Days of Exercise per Week: 5 days    Minutes of Exercise per Session: 40 min  Recent Concern: Physical Activity - Inactive (11/04/2021)   Exercise Vital Sign    Days of Exercise per Week: 0 days  Minutes of Exercise per Session: 0 min  Stress: No Stress Concern Present (02/02/2022)   [place]    Feeling of Stress : Not at all  Social Connections: Moderately Isolated (02/02/2022)   Social Connection and Isolation Panel [NHANES]    Frequency of Communication with Friends and Family: Three times a week    Frequency of Social Gatherings with Friends and Family: Three times a week    Attends Religious Services: Never    Active Member of Clubs or Organizations: No    Attends Archivist Meetings: Never    Marital Status: Married   Past Surgical History:  Procedure Laterality Date   BIOPSY  07/17/2018   Procedure: BIOPSY;  Surgeon: Danie Binder, MD;  [facility];  Service: Endoscopy;;  colon   BOWEL RESECTION  09/17/2018   SMALL BOWEL RESECTION: 71 CM    CARDIAC CATHETERIZATION N/A 07/21/2015   Procedure: Left Heart Cath and Coronary Angiography;   Surgeon: Peter M Martinique, MD;  Location: [place] [facility];  Service: Cardiovascular;  Laterality: N/A;   CARDIAC CATHETERIZATION N/A 07/21/2015   Procedure: Coronary Stent Intervention;  Surgeon: Peter M Martinique, MD;  Location: [place] [facility];  Service: Cardiovascular;  Laterality: N/A;   CIRCUMCISION N/A 04/05/2019   Procedure: CIRCUMCISION ADULT;  Surgeon: Irine Seal, MD;  Location: [facility];  Service: Urology;  Laterality: N/A;   COLONOSCOPY N/A 05/19/2014   Dr. Barnie Alderman diverticulosis/moderate external hemorrhoids, >20 simple adenomas. Genetic screening negative.    COLONOSCOPY WITH PROPOFOL N/A 07/17/2018   Dr. Oneida Alar: Diverticulosis, external/internal hemorrhoids, 32 colon polyps removed.  ten tubular adenomas removed with no high-grade dysplasia.  Advised to have surveillance colonoscopy in 3 years.   COLONOSCOPY WITH PROPOFOL N/A 06/07/2021   Procedure: COLONOSCOPY WITH PROPOFOL;  Surgeon: Eloise Harman, DO;  [facility];  Service: Endoscopy;  Laterality: N/A;  9:30 / ASA 3  (Pt was told that his time will be given at Pre-op)   ESOPHAGOGASTRODUODENOSCOPY (EGD) WITH PROPOFOL N/A 07/17/2018   Dr. Oneida Alar: Low-grade narrowing Schatzki ring at the GE junction status post dilation.  Gastritis.  Biopsy with mild nonspecific reactive gastropathy.  No H. pylori.   GIVENS CAPSULE STUDY N/A 06/24/2019   normal   HERNIA REPAIR  1986   Left inguinal   INTRAVASCULAR PRESSURE WIRE/FFR STUDY Left 06/08/2017   Procedure: INTRAVASCULAR PRESSURE WIRE/FFR STUDY;  Surgeon: Nelva Bush, MD;  Location: [place] [facility];  Service: Cardiovascular;  Laterality: Left;  LAD and CFX   LAPAROTOMY N/A 09/17/2018   Procedure: EXPLORATORY LAPAROTOMY;  Surgeon: Virl Cagey, MD;  [facility];  Service: General;  Laterality: N/A;   LEFT HEART CATH AND CORONARY ANGIOGRAPHY N/A 06/08/2017   Procedure: LEFT HEART CATH AND CORONARY ANGIOGRAPHY;  Surgeon: Nelva Bush, MD;  Location: [place] [facility];  Service: Cardiovascular;  Laterality: N/A;   POLYPECTOMY  07/17/2018   Procedure: POLYPECTOMY;  Surgeon: Danie Binder, MD;  [facility];  Service: Endoscopy;;  colon   POLYPECTOMY  06/07/2021   Procedure: POLYPECTOMY INTESTINAL;  Surgeon: Eloise Harman, DO;  [facility];  Service: Endoscopy;;   ROTATOR CUFF REPAIR     Right   SAVORY DILATION N/A 07/17/2018   Procedure: SAVORY DILATION;  Surgeon: Danie Binder, MD;  [facility];  Service: Endoscopy;  Laterality: N/A;   Past Surgical History:  Procedure Laterality Date  BIOPSY  07/17/2018   Procedure: BIOPSY;  Surgeon: Danie Binder, MD;  [facility];  Service: Endoscopy;;  colon   BOWEL RESECTION  09/17/2018   SMALL BOWEL RESECTION: 71 CM    CARDIAC CATHETERIZATION N/A 07/21/2015   Procedure: Left Heart Cath and Coronary Angiography;  Surgeon: Peter M Martinique, MD;  Location: [place] [facility];  Service: Cardiovascular;  Laterality: N/A;   CARDIAC CATHETERIZATION N/A 07/21/2015   Procedure: Coronary Stent Intervention;  Surgeon: Peter M Martinique, MD;  Location: [place] [facility];  Service: Cardiovascular;  Laterality: N/A;   CIRCUMCISION N/A 04/05/2019   Procedure: CIRCUMCISION ADULT;  Surgeon: Irine Seal, MD;  [facility];  Service: Urology;  Laterality: N/A;   COLONOSCOPY N/A 05/19/2014   Dr. Barnie Alderman diverticulosis/moderate external hemorrhoids, >20 simple adenomas. Genetic screening negative.    COLONOSCOPY WITH PROPOFOL N/A 07/17/2018   Dr. Oneida Alar: Diverticulosis, external/internal hemorrhoids, 32 colon polyps removed.  ten tubular adenomas removed with no high-grade dysplasia.  Advised to have surveillance colonoscopy in 3 years.   COLONOSCOPY WITH PROPOFOL N/A 06/07/2021   Procedure: COLONOSCOPY WITH PROPOFOL;  Surgeon: Eloise Harman, DO;  [facility];  Service: Endoscopy;  Laterality: N/A;  9:30 / ASA 3  (Pt was told that his time will  be given at Pre-op)   ESOPHAGOGASTRODUODENOSCOPY (EGD) WITH PROPOFOL N/A 07/17/2018   Dr. Oneida Alar: Low-grade narrowing Schatzki ring at the GE junction status post dilation.  Gastritis.  Biopsy with mild nonspecific reactive gastropathy.  No H. pylori.   GIVENS CAPSULE STUDY N/A 06/24/2019   normal   HERNIA REPAIR  1986   Left inguinal   INTRAVASCULAR PRESSURE WIRE/FFR STUDY Left 06/08/2017   Procedure: INTRAVASCULAR PRESSURE WIRE/FFR STUDY;  Surgeon: Nelva Bush, MD;  Location: [place] [facility];  Service: Cardiovascular;  Laterality: Left;  LAD and CFX   LAPAROTOMY N/A 09/17/2018   Procedure: EXPLORATORY LAPAROTOMY;  Surgeon: Virl Cagey, MD;  [facility];  Service: General;  Laterality: N/A;   LEFT HEART CATH AND CORONARY ANGIOGRAPHY N/A 06/08/2017   Procedure: LEFT HEART CATH AND CORONARY ANGIOGRAPHY;  Surgeon: Nelva Bush, MD;  Location: [place] [facility];  Service: Cardiovascular;  Laterality: N/A;   POLYPECTOMY  07/17/2018   Procedure: POLYPECTOMY;  Surgeon: Danie Binder, MD;  [facility];  Service: Endoscopy;;  colon   POLYPECTOMY  06/07/2021   Procedure: POLYPECTOMY INTESTINAL;  Surgeon: Eloise Harman, DO;  [facility];  Service: Endoscopy;;   ROTATOR CUFF REPAIR     Right   SAVORY DILATION N/A 07/17/2018   Procedure: SAVORY DILATION;  Surgeon: Danie Binder, MD;  [facility];  Service: Endoscopy;  Laterality: N/A;   Past Medical History:  Diagnosis Date   Anemia    Anxiety    Asthma    CAD (coronary artery disease)    DES to distal circumflex 2016   Cataract    Colon polyps    30 colon polyps found on first colonoscopy   Diastolic heart failure (HCC)    Diverticulitis    DJD (degenerative joint disease)    GERD (gastroesophageal reflux disease)    History of kidney stones    Hyperlipidemia    Hypertension    Insomnia    Obstructive sleep apnea 12/2009   01/26/2010 AHI 83/hr   Permanent atrial  fibrillation ([place])    Onset 2006 paroxysmal then progressive to persistent   PUD (peptic ulcer disease)  1980s   RLS (restless legs syndrome)    Sinusitis    Skin cancer    Type 2 diabetes mellitus (HCC)    BP 118/77   Pulse 79   Ht '5\' 7"'$  (1.702 m)   Wt 224 lb 9.6 oz (101.9 kg)   SpO2 96%   BMI 35.18 kg/m   Opioid Risk Score:   Fall Risk Score:  `1  Depression screen [facility] 2/9     02/08/2022    9:43 AM 02/02/2022    1:59 PM 02/02/2022    1:57 PM 12/07/2021    1:11 PM 12/07/2021   12:58 PM 11/04/2021    6:23 PM 10/21/2021   11:20 AM  Depression screen PHQ 2/9  Decreased Interest 0 0 0 0 '1 2 2  '$ Down, Depressed, Hopeless 0 0 0 0 1 0 0  PHQ - 2 Score 0 0 0 0 '2 2 2  '$ Altered sleeping      3   Tired, decreased energy      3   Change in appetite      2   Feeling bad or failure about yourself       2   Trouble concentrating      2   Moving slowly or fidgety/restless      1   Suicidal thoughts      0   PHQ-9 Score      15   Difficult doing work/chores      Very difficult      Review of Systems  Constitutional: Negative.   HENT: Negative.    Eyes: Negative.   Respiratory: Negative.    Cardiovascular: Negative.   Gastrointestinal: Negative.   Endocrine: Negative.   Genitourinary: Negative.   Musculoskeletal: Negative.   Skin: Negative.   Allergic/Immunologic: Negative.   Neurological: Negative.   Hematological: Negative.   Psychiatric/Behavioral: Negative.        Objective:   Physical Exam Vitals and nursing note reviewed.  Constitutional:      Appearance: He is normal weight.  HENT:     Head: Normocephalic and atraumatic.  Eyes:     Extraocular Movements: Extraocular movements intact.     Conjunctiva/sclera: Conjunctivae normal.     Pupils: Pupils are equal, round, and reactive to light.  Musculoskeletal:     Comments: Minimal tenderness palpation of the lumbosacral junction.  No pain with lumbar flexion there is pain with lumbar extension with limited range of  motion to 25% of normal Lumbar flexion is approximately 75% of normal Right hip has 0 degrees internal extra rotation 30 degree flexion contracture at the right hip.  No pain with hip range of motion.  Skin:    General: Skin is warm and dry.  Neurological:     General: No focal deficit present.     Mental Status: He is alert and oriented to person, place, and time.     Comments: Motor strength is 5/5 bilateral hip flexor knee extensor ankle dorsiflexor Negative straight leg raising bilaterally Sensation intact to light touch lower extremities.  Psychiatric:        Mood and Affect: Mood normal.        Behavior: Behavior normal.           Assessment & Plan:  1.  Low back pain has become more apparent since the right hip pain has subsided.  Review of CT scan of the abdomen and pelvis from 2020 demonstrates L5-S1 disc degeneration anterolisthesis L5 on S1 and  facet hypertrophy L4-5 L5-S1. The patient has had physical therapy recently without improvement of his back pain He has been on tramadol prescribed by his PCP without much relief I do think he would be a good candidate for lumbar medial branch blocks as a diagnostic procedure to evaluate for potential radiofrequency neurotomy of the L3-L4 medial branches and L5 dorsal ramus. 2.  Right hip OA with contracture symptomatically doing better 2 months post injection under fluoroscopic guidance.  We will monitor for recurrence continue his hip exercise home exercise program.

## 2022-02-08 NOTE — Progress Notes (Signed)
Pre Procedure instructions reviewed for Bil MBB L3-4-5 scheduled for 04/07/22 arrive by 9:45 for 10:00 procedure. No driver needed. Does not require pre med and will not need to hold Eliquis.

## 2022-02-09 ENCOUNTER — Ambulatory Visit (INDEPENDENT_AMBULATORY_CARE_PROVIDER_SITE_OTHER): Payer: Medicare Other

## 2022-02-09 DIAGNOSIS — E1165 Type 2 diabetes mellitus with hyperglycemia: Secondary | ICD-10-CM

## 2022-02-09 DIAGNOSIS — I482 Chronic atrial fibrillation, unspecified: Secondary | ICD-10-CM

## 2022-02-09 DIAGNOSIS — I2510 Atherosclerotic heart disease of native coronary artery without angina pectoris: Secondary | ICD-10-CM

## 2022-02-09 DIAGNOSIS — I5032 Chronic diastolic (congestive) heart failure: Secondary | ICD-10-CM

## 2022-02-09 DIAGNOSIS — I10 Essential (primary) hypertension: Secondary | ICD-10-CM

## 2022-02-09 NOTE — Chronic Care Management (AMB) (Cosign Needed)
Chronic Care Management   CCM RN Visit Note  02/09/2022 Name: Shaun [name] MRN: [id number] DOB: March 23, 1956  Subjective: Shaun [name] is a 66 y.o. year old male who is a primary care patient of Shaun Coss, NP. The care management team was consulted for assistance with disease management and care coordination needs.    Engaged with patient by telephone for follow up visit in response to provider referral for case management and/or care coordination services.   Consent to Services:  The patient was given information about Chronic Care Management services, agreed to services, and gave verbal consent prior to initiation of services.  Please see initial visit note for detailed documentation.   Patient agreed to services and verbal consent obtained.   Assessment: Review of patient past medical history, allergies, medications, health status, including review of consultants reports, laboratory and other test data, was performed as part of comprehensive evaluation and provision of chronic care management services.   SDOH (Social Determinants of Health) assessments and interventions performed:    CCM Care Plan  No Known Allergies  Outpatient Encounter Medications as of 02/09/2022  Medication Sig Note   acetaminophen (TYLENOL) 325 MG tablet Take 2 tablets (650 mg total) by mouth every 6 (six) hours as needed for mild pain.    albuterol (VENTOLIN HFA) 108 (90 Base) MCG/ACT inhaler INHALE 2 PUFFS INTO THE LUNGS EVERY 6 HOURS AS NEEDED FOR WHEEZING OR SHORTNESS OF BREATH    amoxicillin-clavulanate (AUGMENTIN) 875-125 MG tablet Take 1 tablet by mouth 2 (two) times daily.    apixaban (ELIQUIS) 5 MG TABS tablet TAKE 1 TABLET (5 MG) BY MOUTH TWICE DAILY    atorvastatin (LIPITOR) 80 MG tablet Take 1 tablet (80 mg total) by mouth daily. TAKE 1 TABLET(80 MG) BY MOUTH EVERY EVENING    azelastine (ASTELIN) 0.1 % nasal spray Place 1 spray into both nostrils 2 (two) times daily. Use in each  nostril as directed    buPROPion (WELLBUTRIN XL) 150 MG 24 hr tablet Take 3 tablets once daily    docusate sodium (COLACE) 100 MG capsule Take 100 mg by mouth daily as needed for mild constipation.    escitalopram (LEXAPRO) 20 MG tablet TAKE 1 TABLET(20 MG) BY MOUTH DAILY    ezetimibe (ZETIA) 10 MG tablet TAKE 1 TABLET BY MOUTH EVERY DAY    ferrous sulfate 325 (65 FE) MG tablet Take 325 mg by mouth daily with breakfast.    furosemide (LASIX) 40 MG tablet Take 1 tablet (40 mg total) by mouth daily.    hydrOXYzine (ATARAX) 10 MG tablet Take 1 tablet (10 mg total) by mouth 2 (two) times daily as needed for itching.    magnesium gluconate (MAGONATE) 500 MG tablet Take 0.5 tablets (250 mg total) by mouth at bedtime.    metFORMIN (GLUCOPHAGE) 500 MG tablet TAKE 1 TABLET (500 MG) BY MOUTH TWICE DAILY WITH A MEAL.    Metoprolol Tartrate 75 MG TABS Take 75 mg by mouth 2 (two) times daily.    nitroGLYCERIN (NITROSTAT) 0.4 MG SL tablet Place 1 tablet (0.4 mg total) under the tongue every 5 (five) minutes as needed.    omeprazole (PRILOSEC) 40 MG capsule Take 1 capsule by mouth once daily 30 minutes before breakfast    potassium chloride SA (KLOR-CON M) 20 MEQ tablet Take 1 tablet (20 mEq total) by mouth daily.    predniSONE (DELTASONE) 10 MG tablet 3 tabs x3 days and then 2 tabs x3 days and then 1 tab  x3 days.  Take w/ food. (Patient not taking: Reported on 02/08/2022)    QUEtiapine (SEROQUEL) 50 MG tablet Take 1 tablet (50 mg total) by mouth at bedtime.    rOPINIRole (REQUIP) 3 MG tablet TAKE 1 TABLET BY MOUTH EVERY NIGHT AT BEDTIME    sildenafil (VIAGRA) 100 MG tablet TAKE 1 TABLET BY MOUTH 30 MINUTES BEFORE ACTIVITY    sucralfate (CARAFATE) 1 g tablet Take one tablet po BID PRN (Patient taking differently: Take 1 g by mouth 2 (two) times daily as needed (acid reflux).)    traMADol (ULTRAM) 50 MG tablet TAKE 1 TABLET BY MOUTH TWICE DAILY AS NEEDED FOR MODERATE PAIN. 12/07/2021: Prescribed by Shaun Coss,  NP   triamcinolone ointment (KENALOG) 0.1 % Apply 1 application. topically 2 (two) times daily.    No facility-administered encounter medications on file as of 02/09/2022.    Patient Active Problem List   Diagnosis Date Noted   Debility 10/21/2021   Acute respiratory failure ([place]) 08/25/2021   Sundowning 08/23/2021   Dysphagia 08/22/2021   Hypernatremia 08/22/2021   Obesity (BMI 30-39.9) 08/21/2021   Endotracheally intubated    Influenza    Acute respiratory failure with hypoxemia ([place]) 08/06/2021   Ventral hernia 12/31/2020   Nausea with vomiting 12/31/2019   Dyslipidemia, goal LDL below 70 06/18/2019   Hx of adenomatous colonic polyps 10/17/2018   Anemia 10/17/2018   Phimosis of penis 09/27/2018   Primary osteoarthritis of right hip 06/20/2018   Esophageal dysphagia 05/21/2018   GERD (gastroesophageal reflux disease) 05/21/2018   RLS (restless legs syndrome)    PUD (peptic ulcer disease)    Insomnia    Chronic diastolic CHF (congestive heart failure) ([place])    Asthma    Arteriosclerotic cardiovascular disease (ASCVD)    Chronic anticoagulation 01/27/2017   Depression 09/24/2015   COPD (chronic obstructive pulmonary disease) ([place]) 07/20/2015   Accelerating angina ([place]) 07/19/2015   Genetic testing 09/09/2014   H/O adenomatous polyp of colon 08/11/2014   Non-insulin treated type 2 diabetes mellitus ([place]) 02/09/2011   CAD S/P percutaneous coronary angioplasty    Tobacco abuse    Essential hypertension    Chronic atrial fibrillation ([place]) 02/23/2010   Obstructive sleep apnea 12/27/2009    Conditions to be addressed/monitored:Atrial Fibrillation, CHF, CAD, HTN, COPD, and DMII  Care Plan : RN Care Manager Plan of Care  Updates made by Shaun Ped, RN since 02/09/2022 12:00 AM     Problem: Chronic Disease Management and Care Coordination Needs (DM,CHF, CAD, HTN, Atrial Fib,COPD, depression, HLD)   Priority: High     Long-Range Goal: Establish Plan of Care for  Chronic Disease Management Needs (DM,CHF, CAD, HTN, Atrial Fib,COPD, depression, HLD)   Start Date: 09/22/2021  Expected End Date: 09/19/2022  Priority: High  Note:   Current Barriers:  Knowledge Deficits related to plan of care for management of Atrial Fibrillation, CHF, CAD, HTN, HLD, COPD, DMII, and Depression  Chronic Disease Management support and education needs related to Atrial Fibrillation, CHF, CAD, HTN, HLD, COPD, DMII, and Depression  No Advanced Directives in place States that he has good and bad nights for sleeping.   States he completed PT and he is feeling stronger and doing more.  States he is trying to walk his dog and tries to walk at least 1 hr a day.  States his CBGs are ranging 100-149 at different times of the day Denies any low readings.  States he is weighting daily and his weight is stable  was 220 today.   States he has had a few times when he needed to take extra fluid pills for increased weight.  States he might not have been watching how much sodium he was eating.  Denies any chest pains or swelling.  States he gets winded if he walks too fast and thinks it is from his A Fib not his lungs. States he can do more before he gets winded.  Denies any coughing or choking.  States he is trying to eat healthy.  States his B/P has ranging 120-122/68-72.  States his rt hip pain is better since he got a shot and going to PT. States his itching is better now  RNCM Clinical Goal(s):  Patient will verbalize understanding of plan for management of Atrial Fibrillation, CHF, CAD, HTN, HLD, COPD, DMII, and Depression as evidenced by voiced adherence to plan of care verbalize basic understanding of  Atrial Fibrillation, CHF, CAD, HTN, HLD, COPD, DMII, and Depression disease process and self health management plan as evidenced by voiced understanding and teach back take all medications exactly as prescribed and will call provider for medication related questions as evidenced by dispense report  and pt verbalization attend all scheduled medical appointments: GI 02/23/22, Cardiology 02/25/22, Kathrin Ruddy NP 04/19/22, Dr. Letta Pate 04/07/22, Annual Wellness 02/09/23  Visit as evidenced by medical records demonstrate Improved adherence to prescribed treatment plan for Atrial Fibrillation, CHF, CAD, HTN, HLD, COPD, DMII, and Depression as evidenced by readings within limits, voiced adherence to plan of care continue to work with RN Care Manager to address care management and care coordination needs related to  Atrial Fibrillation, CHF, CAD, HTN, HLD, COPD, DMII, and Depression as evidenced by adherence to CM Team Scheduled appointments work with Education officer, museum to address  related to the management of Mental Health Concerns  related to the management of Atrial Fibrillation, CHF, CAD, HTN, HLD, COPD, DMII, and Depression as evidenced by review of EMR and patient or Education officer, museum report through collaboration with Consulting civil engineer, provider, and care team.   Interventions: 1:1 collaboration with primary care provider regarding development and update of comprehensive plan of care as evidenced by provider attestation and co-signature Inter-disciplinary care team collaboration (see longitudinal plan of care) Evaluation of current treatment plan related to  self management and patient's adherence to plan as established by provider   Insomnia/depression  (Status:  Goal on track:  Yes.)  Long Term Goal Evaluation of current treatment plan related to Depression and insomnia , Mental Health Concerns  self-management and patient's adherence to plan as established by provider. Discussed plans with patient for ongoing care management follow up and provided patient with direct contact information for care management team Evaluation of current treatment plan related to Insomnia and patient's adherence to plan as established by provider Provided education to patient re: insomnia and sleep hygeine Social Work referral  for depression and insomnia post hospitalization working with LCSW Reinforced good sleep hygiene habits    AFIB Interventions: (Status:  Goal on track:  Yes.) Long Term Goal   Counseled on increased risk of stroke due to Afib and benefits of anticoagulation for stroke prevention Reviewed importance of adherence to anticoagulant exactly as prescribed Counseled on bleeding risk associated with Eliquis and importance of self-monitoring for signs/symptoms of bleeding Counseled on avoidance of NSAIDs due to increased bleeding risk with anticoagulants Afib action plan reviewed Reinforce fall safety and use of anticoagulant. Reinforced to check pulse regularly and notify cardiology if pulse remains elevated  CAD Interventions: (Status:  Goal on track:  Yes.) Long Term Goal Assessed understanding of CAD diagnosis Medications reviewed including medications utilized in CAD treatment plan Provided education on importance of blood pressure control in management of CAD Provided education on Importance of limiting foods high in cholesterol Reviewed Importance of taking all medications as prescribed Reviewed s/sx of CAD and when to call 911 or provider. Reinforced to continue to remain tobacco free   Heart Failure Interventions:  (Status:  Goal on track:  Yes.) Long Term Goal Basic overview and discussion of pathophysiology of Heart Failure reviewed Provided education on low sodium diet Reviewed Heart Failure Action Plan in depth and provided written copy Discussed importance of daily weight and advised patient to weigh and record daily Reviewed role of diuretics in prevention of fluid overload and management of heart failure; Discussed the importance of keeping all appointments with provider Reviewed importance of following a low sodium diet. Reinforced to pace his activity. Reinforced s/sx of HF and when to call provider  COPD Interventions:  (Status:  Goal on track:  Yes.) Long Term Goal Advised  patient to track and manage COPD triggers Provided instruction about proper use of medications used for management of COPD including inhalers Advised patient to engage in light exercise as tolerated 3-5 days a week to aid in the the management of COPD Provided education about and advised patient to utilize infection prevention strategies to reduce risk of respiratory infection Reinforced to remain tobacco free   Diabetes Interventions:  (Status:  Goal on track:  Yes.) Long Term Goal Assessed patient's understanding of A1c goal: <7% Provided education to patient about basic DM disease process Reviewed medications with patient and discussed importance of medication adherence Discussed plans with patient for ongoing care management follow up and provided patient with direct contact information for care management team Provided patient with written educational materials related to hypo and hyperglycemia and importance of correct treatment Advised patient, providing education and rationale, to check cbg daily and record, calling provider for findings outside established parameters  Reviewed to avoid sweets and concentrated sugars.Reinforced importance of regular exercise to help with blood sugar control Lab Results  Component Value Date   HGBA1C 6.3 10/20/2021   Hyperlipidemia Interventions:  (Status:  Condition stable.  Not addressed this visit.) Long Term Goal Medication review performed; medication list updated in electronic medical record.  Counseled on importance of regular laboratory monitoring as prescribed Reviewed role and benefits of statin for ASCVD risk reduction Reviewed importance of limiting foods high in cholesterol Reviewed exercise goals and target of 150 minutes per week  Hypertension Interventions:  (Status:  Goal on track:  Yes.) Long Term Goal Last practice recorded BP readings:  BP Readings from Last 3 Encounters:  02/08/22 118/77  01/13/22 122/68  12/07/21 110/79   Most recent eGFR/CrCl:  Lab Results  Component Value Date   EGFR 96 06/28/2021    No components found for: CRCL  Evaluation of current treatment plan related to hypertension self management and patient's adherence to plan as established by provider Provided education to patient re: stroke prevention, s/s of heart attack and stroke Reviewed medications with patient and discussed importance of compliance Discussed plans with patient for ongoing care management follow up and provided patient with direct contact information for care management team Advised patient, providing education and rationale, to monitor blood pressure daily and record, calling PCP for findings outside established parameters Provided education on prescribed diet low sodium low CHO heart healthy  Reinforced to drink adequate amounts of fluids to keep his B/P steady.  Reviewed to call his provider if he has frequent low B/P  Patient Goals/Self-Care Activities: Take all medications as prescribed Attend all scheduled provider appointments Perform all self care activities independently  Call provider office for new concerns or questions  call office if I gain more than 2 pounds in one day or 5 pounds in one week keep legs up while sitting watch for swelling in feet, ankles and legs every day weigh myself daily follow rescue plan if symptoms flare-up keep appointment with eye doctor check blood sugar at prescribed times: once daily and when you have symptoms of low or high blood sugar check feet daily for cuts, sores or redness take the blood sugar log to all doctor visits drink 6 to 8 glasses of water each day fill half of plate with vegetables manage portion size switch to sugar-free drinks keep feet up while sitting wash and dry feet carefully every day eliminate smoking in my home identify and remove indoor air pollutants limit outdoor activity during cold weather eliminate symptom triggers at home check pulse  (heart) rate once a day make a plan to exercise regularly make a plan to eat healthy check blood pressure daily choose a place to take my blood pressure (home, clinic or office, retail store) take blood pressure log to all doctor appointments take medications for blood pressure exactly as prescribed report new symptoms to your doctor eat more whole grains, fruits and vegetables, lean meats and healthy fats limit salt intake to 235m/day call for medicine refill 2 or 3 days before it runs out take all medications exactly as prescribed call doctor with any symptoms you believe are related to your medicine  Follow Up Plan:  Telephone follow up appointment with care management team member scheduled for:  03/30/22 The patient has been provided with contact information for the care management team and has been advised to call with any health related questions or concerns.       Plan:Telephone follow up appointment with care management team member scheduled for:  03/30/22 The patient has been provided with contact information for the care management team and has been advised to call with any health related questions or concerns.  MPeter GarterRN, BJackquline Denmark CDE Care Management Coordinator [facility] ([phone]

## 2022-02-09 NOTE — Patient Instructions (Signed)
Visit Information  Thank you for taking time to visit with me today. Please don't hesitate to contact me if I can be of assistance to you before our next scheduled telephone appointment.  Following are the goals we discussed today:  Take all medications as prescribed Attend all scheduled provider appointments Perform all self care activities independently  Call provider office for new concerns or questions  call office if I gain more than 2 pounds in one day or 5 pounds in one week keep legs up while sitting watch for swelling in feet, ankles and legs every day weigh myself daily follow rescue plan if symptoms flare-up keep appointment with eye doctor check blood sugar at prescribed times: once daily and when you have symptoms of low or high blood sugar check feet daily for cuts, sores or redness take the blood sugar log to all doctor visits drink 6 to 8 glasses of water each day fill half of plate with vegetables manage portion size switch to sugar-free drinks keep feet up while sitting wash and dry feet carefully every day eliminate smoking in my home identify and remove indoor air pollutants limit outdoor activity during cold weather eliminate symptom triggers at home check pulse (heart) rate once a day make a plan to exercise regularly make a plan to eat healthy check blood pressure daily choose a place to take my blood pressure (home, clinic or office, retail store) take blood pressure log to all doctor appointments take medications for blood pressure exactly as prescribed report new symptoms to your doctor eat more whole grains, fruits and vegetables, lean meats and healthy fats limit salt intake to '2300mg'$ /day call for medicine refill 2 or 3 days before it runs out take all medications exactly as prescribed call doctor with any symptoms you believe are related to your medicine  Our next appointment is by telephone on 03/30/22 at 2:15 PM  Please call the care guide team  at [phone] if you need to cancel or reschedule your appointment.   If you are experiencing a Mental Health or [place] or need someone to talk to, please call the Suicide and Crisis Lifeline: 988 call the [place] National Suicide Prevention Lifeline: 2042940181 or TTY: (213)544-8347 TTY 234-846-7622) to talk to a trained counselor call 1-800-273-TALK (toll free, 24 hour hotline) call 911   Patient verbalizes understanding of instructions and care plan provided today and agrees to view in [facility]. Active MyChart status and patient understanding of how to access instructions and care plan via MyChart confirmed with patient.     Peter Garter RN, Jackquline Denmark, CDE Care Management Coordinator [facility] [phone]

## 2022-02-10 NOTE — Progress Notes (Signed)
Jo rescheduled for 6/23 at 1045  Thank you   [facility]  Direct Dial: [phone]

## 2022-02-11 ENCOUNTER — Other Ambulatory Visit (HOSPITAL_BASED_OUTPATIENT_CLINIC_OR_DEPARTMENT_OTHER): Payer: Self-pay | Admitting: Cardiology

## 2022-02-11 ENCOUNTER — Other Ambulatory Visit (HOSPITAL_BASED_OUTPATIENT_CLINIC_OR_DEPARTMENT_OTHER): Payer: Self-pay

## 2022-02-11 DIAGNOSIS — I482 Chronic atrial fibrillation, unspecified: Secondary | ICD-10-CM

## 2022-02-14 ENCOUNTER — Other Ambulatory Visit (HOSPITAL_BASED_OUTPATIENT_CLINIC_OR_DEPARTMENT_OTHER): Payer: Self-pay

## 2022-02-14 MED ORDER — APIXABAN 5 MG PO TABS
ORAL_TABLET | ORAL | 2 refills | Status: DC
  Filled 2022-02-14: qty 180, 90d supply, fill #0
  Filled 2022-06-22 – 2022-10-28 (×3): qty 180, 90d supply, fill #1

## 2022-02-14 NOTE — Telephone Encounter (Signed)
Eliquis '5mg'$  refill request received. Patient is 66 years old, weight-101.9kg, Crea-0.89 on 10/20/2021, Diagnosis-Afib, and last seen by Laurann Montana, NP on 09/23/2021. Dose is appropriate based on dosing criteria. Will send in refill to requested pharmacy.

## 2022-02-16 NOTE — Progress Notes (Signed)
HPI: FU permanent atrial fibrillation, coronary artery disease, chronic diastolic congestive heart failure, diabetes mellitus and hypertension. Patient is status post PCI of his circumflex in November 2016. Patient was seen in atrial fibrillation clinic in June 2018 to see if there were options for restoring sinus rhythm. Given long-standing atrial fibrillation and left atrial enlargement it was felt that rate control and anticoagulation indicated. Cardiac catheterization October 2018 showed 30% distal circumflex.  LV function was normal with normal LV filling pressure. Monitor 8/20 showed atrial fibrillation with PVCs or aberrantly conducted beats, rate controlled. Abdominal CT July 2022 showed no aneurysm. Echocardiogram December 2022 showed normal LV function, moderate left ventricular hypertrophy, mild left atrial enlargement.  Since last seen, the patient has dyspnea with more extreme activities but not with routine activities. It is relieved with rest. It is not associated with chest pain. There is no orthopnea, PND or pedal edema. There is no syncope or palpitations. There is no exertional chest pain.   Current Outpatient Medications  Medication Sig Dispense Refill   acetaminophen (TYLENOL) 325 MG tablet Take 2 tablets (650 mg total) by mouth every 6 (six) hours as needed for mild pain.     albuterol (VENTOLIN HFA) 108 (90 Base) MCG/ACT inhaler INHALE 2 PUFFS INTO THE LUNGS EVERY 6 HOURS AS NEEDED FOR WHEEZING OR SHORTNESS OF BREATH 18 g 11   apixaban (ELIQUIS) 5 MG TABS tablet TAKE 1 TABLET (5 MG) BY MOUTH TWICE DAILY 180 tablet 2   atorvastatin (LIPITOR) 80 MG tablet Take 1 tablet (80 mg total) by mouth daily. TAKE 1 TABLET(80 MG) BY MOUTH EVERY EVENING 90 tablet 3   azelastine (ASTELIN) 0.1 % nasal spray Place 1 spray into both nostrils 2 (two) times daily. Use in each nostril as directed 30 mL 12   buPROPion (WELLBUTRIN XL) 150 MG 24 hr tablet Take 3 tablets once daily 270 tablet 3    docusate sodium (COLACE) 100 MG capsule Take 100 mg by mouth daily as needed for mild constipation.     escitalopram (LEXAPRO) 20 MG tablet TAKE 1 TABLET(20 MG) BY MOUTH DAILY 90 tablet 0   ezetimibe (ZETIA) 10 MG tablet TAKE 1 TABLET BY MOUTH EVERY DAY 90 tablet 3   ferrous sulfate 325 (65 FE) MG tablet Take 325 mg by mouth daily with breakfast.     furosemide (LASIX) 40 MG tablet Take 1 tablet (40 mg total) by mouth daily. 90 tablet 3   hydrOXYzine (ATARAX) 10 MG tablet Take 1 tablet (10 mg total) by mouth 2 (two) times daily as needed for itching. 30 tablet 0   magnesium gluconate (MAGONATE) 500 MG tablet Take 0.5 tablets (250 mg total) by mouth at bedtime. 30 tablet 0   metFORMIN (GLUCOPHAGE) 500 MG tablet TAKE 1 TABLET (500 MG) BY MOUTH TWICE DAILY WITH A MEAL. 180 tablet 0   Metoprolol Tartrate 75 MG TABS Take 75 mg by mouth 2 (two) times daily. 60 tablet 0   nitroGLYCERIN (NITROSTAT) 0.4 MG SL tablet Place 1 tablet (0.4 mg total) under the tongue every 5 (five) minutes as needed. 25 tablet 3   omeprazole (PRILOSEC) 40 MG capsule Take 1 capsule by mouth once daily 30 minutes before breakfast 90 capsule 3   potassium chloride SA (KLOR-CON M) 20 MEQ tablet Take 1 tablet (20 mEq total) by mouth daily. 90 tablet 3   QUEtiapine (SEROQUEL) 50 MG tablet Take 1 tablet (50 mg total) by mouth at bedtime. 90 tablet 0  rOPINIRole (REQUIP) 3 MG tablet TAKE 1 TABLET BY MOUTH EVERY NIGHT AT BEDTIME 450 tablet 0   sildenafil (VIAGRA) 100 MG tablet TAKE 1 TABLET BY MOUTH 30 MINUTES BEFORE ACTIVITY 8 tablet 0   traMADol (ULTRAM) 50 MG tablet TAKE 1 TABLET BY MOUTH TWICE DAILY AS NEEDED FOR MODERATE PAIN. 60 tablet 4   triamcinolone ointment (KENALOG) 0.1 % Apply 1 application. topically 2 (two) times daily. 90 g 1   sucralfate (CARAFATE) 1 g tablet Take one tablet po BID PRN (Patient taking differently: Take 1 g by mouth 2 (two) times daily as needed (acid reflux).) 42 tablet 5   No current  facility-administered medications for this visit.     Past Medical History:  Diagnosis Date   Anemia    Anxiety    Asthma    CAD (coronary artery disease)    DES to distal circumflex 2016   Cataract    Colon polyps    30 colon polyps found on first colonoscopy   Diastolic heart failure (HCC)    Diverticulitis    DJD (degenerative joint disease)    GERD (gastroesophageal reflux disease)    History of kidney stones    Hyperlipidemia    Hypertension    Insomnia    Obstructive sleep apnea 12/2009   01/26/2010 AHI 83/hr   Permanent atrial fibrillation ([place])    Onset 2006 paroxysmal then progressive to persistent   PUD (peptic ulcer disease)    1980s   RLS (restless legs syndrome)    Sinusitis    Skin cancer    Type 2 diabetes mellitus ([place])     Past Surgical History:  Procedure Laterality Date   BIOPSY  07/17/2018   Procedure: BIOPSY;  Surgeon: Danie Binder, MD;  [facility];  Service: Endoscopy;;  colon   BOWEL RESECTION  09/17/2018   SMALL BOWEL RESECTION: 71 CM    CARDIAC CATHETERIZATION N/A 07/21/2015   Procedure: Left Heart Cath and Coronary Angiography;  Surgeon: Peter M Martinique, MD;  Location: [place] [facility];  Service: Cardiovascular;  Laterality: N/A;   CARDIAC CATHETERIZATION N/A 07/21/2015   Procedure: Coronary Stent Intervention;  Surgeon: Peter M Martinique, MD;  Location: [place] [facility];  Service: Cardiovascular;  Laterality: N/A;   CIRCUMCISION N/A 04/05/2019   Procedure: CIRCUMCISION ADULT;  Surgeon: Irine Seal, MD;  [facility]: [facility];  Service: Urology;  Laterality: N/A;   COLONOSCOPY N/A 05/19/2014   Dr. Barnie Alderman diverticulosis/moderate external hemorrhoids, >20 simple adenomas. Genetic screening negative.    COLONOSCOPY WITH PROPOFOL N/A 07/17/2018   Dr. Oneida Alar: Diverticulosis, external/internal hemorrhoids, 32 colon polyps removed.  ten tubular adenomas removed with no high-grade dysplasia.  Advised to have surveillance colonoscopy in 3  years.   COLONOSCOPY WITH PROPOFOL N/A 06/07/2021   Procedure: COLONOSCOPY WITH PROPOFOL;  Surgeon: Eloise Harman, DO;  [facility];  Service: Endoscopy;  Laterality: N/A;  9:30 / ASA 3  (Pt was told that his time will be given at Pre-op)   ESOPHAGOGASTRODUODENOSCOPY (EGD) WITH PROPOFOL N/A 07/17/2018   Dr. Oneida Alar: Low-grade narrowing Schatzki ring at the GE junction status post dilation.  Gastritis.  Biopsy with mild nonspecific reactive gastropathy.  No H. pylori.   GIVENS CAPSULE STUDY N/A 06/24/2019   normal   HERNIA REPAIR  1986   Left inguinal   INTRAVASCULAR PRESSURE WIRE/FFR STUDY Left 06/08/2017   Procedure: INTRAVASCULAR PRESSURE WIRE/FFR STUDY;  Surgeon: Nelva Bush, MD;  Location: [facility] [facility];  Service: Cardiovascular;  Laterality: Left;  LAD and CFX   LAPAROTOMY N/A 09/17/2018   Procedure: EXPLORATORY LAPAROTOMY;  Surgeon: Virl Cagey, MD;  [facility];  Service: General;  Laterality: N/A;   LEFT HEART CATH AND CORONARY ANGIOGRAPHY N/A 06/08/2017   Procedure: LEFT HEART CATH AND CORONARY ANGIOGRAPHY;  Surgeon: Nelva Bush, MD;  Location: [facility];  Service: Cardiovascular;  Laterality: N/A;   POLYPECTOMY  07/17/2018   Procedure: POLYPECTOMY;  Surgeon: Danie Binder, MD;  [facility];  Service: Endoscopy;;  colon   POLYPECTOMY  06/07/2021   Procedure: POLYPECTOMY INTESTINAL;  Surgeon: Eloise Harman, DO;  [facility];  Service: Endoscopy;;   ROTATOR CUFF REPAIR     Right   SAVORY DILATION N/A 07/17/2018   Procedure: SAVORY DILATION;  Surgeon: Danie Binder, MD;  [facility];  Service: Endoscopy;  Laterality: N/A;    Social History   Socioeconomic History   Marital status: Married    Spouse name: [name] Yardley   Number of children: 5   Years of education: 12   Highest education level: 12th grade  Occupational History   Occupation: employed    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  Tobacco Use   Smoking status: Former    Packs/day: 0.50    Years: 39.00    Total pack years: 19.50    Types: Cigarettes    Start date: 07/24/1970    Quit date: 08/06/2021    Years since quitting: 0.5    Passive exposure: Past   Smokeless tobacco: Never  Vaping Use   Vaping Use: Never used  Substance and Sexual Activity   Alcohol use: No    Alcohol/week: 0.0 standard drinks of alcohol   Drug use: No   Sexual activity: Yes    Partners: Female  Other Topics Concern   Not on file  Social History Narrative   Not on file   Social Determinants of Health   Financial Resource Strain: Low Risk  (02/02/2022)   Overall Financial Resource Strain (CARDIA)    Difficulty of Paying Living Expenses: Not hard at all  Food Insecurity: No Food Insecurity (02/02/2022)   Hunger Vital Sign    Worried About Running Out of Food in the Last Year: Never true    [place] in the Last Year: Never true  Transportation Needs: No Transportation Needs (02/02/2022)   PRAPARE - Hydrologist (Medical): No    Lack of Transportation (Non-Medical): No  Physical Activity: Sufficiently Active (02/02/2022)   Exercise Vital Sign    Days of Exercise per Week: 5 days    Minutes of Exercise per Session: 40 min  Recent Concern: Physical Activity - Inactive (11/04/2021)   Exercise Vital Sign    Days of Exercise per Week: 0 days    Minutes of Exercise per Session: 0 min  Stress: No Stress Concern Present (02/02/2022)   [place]    Feeling of Stress : Not at all  Social Connections: Moderately Isolated (02/02/2022)   Social Connection and Isolation Panel [NHANES]    Frequency of Communication with Friends and Family: Three times a week    Frequency of Social Gatherings with Friends and Family: Three times a week    Attends Religious Services: Never    Active Member of Clubs or Organizations: No    Attends Theatre manager Meetings:  Never    Marital Status: Married  Human resources officer Violence: Not At Risk (02/02/2022)   Humiliation, Afraid, Rape, and Kick questionnaire    Fear of Current or Ex-Partner: No    Emotionally Abused: No    Physically Abused: No    Sexually Abused: No    Family History  Problem Relation Age of Onset   Hypertension Mother    Breast cancer Mother 8       brain/bone    Heart attack Father    Skin cancer Sister 26   Diabetes Brother    Parkinson's disease Brother    Brain cancer Maternal Uncle    Cancer Maternal Uncle        NOS   Breast cancer Cousin        maternal cousin dx <50   Cancer Cousin    Colon cancer Neg Hx     ROS: no fevers or chills, productive cough, hemoptysis, dysphasia, odynophagia, melena, hematochezia, dysuria, hematuria, rash, seizure activity, orthopnea, PND, pedal edema, claudication. Remaining systems are negative.  Physical Exam: Well-developed well-nourished in no acute distress.  Skin is warm and dry.  HEENT is normal.  Neck is supple.  Chest is clear to auscultation with normal expansion.  Cardiovascular exam is irregular Abdominal exam nontender or distended. No masses palpated. Extremities show no edema. neuro grossly intact  ECG-atrial fibrillation, no ST changes.  Personally reviewed  A/P  1 permanent atrial fibrillation-plan to continue metoprolol for rate control.  Continue apixaban.  2 hypertension-patient's blood pressure is controlled.  Continue present medications.  3 hyperlipidemia-continue lipitor and Zetia.  4 coronary artery disease-no chest pain.  Continue statin.  No aspirin given need for anticoagulation.  5 chronic diastolic congestive heart failure-patient is euvolemic.  We will continue present dose of diuretic.  6 tobacco abuse-patient has discontinued since December.  7 obstructive sleep apnea-CPAP.  Kirk Ruths, MD

## 2022-02-16 NOTE — Chronic Care Management (AMB) (Signed)
Pt scheduled for 0/35/2481 with Licensed Clinical SW

## 2022-02-18 ENCOUNTER — Ambulatory Visit: Payer: Medicare Other | Admitting: *Deleted

## 2022-02-18 DIAGNOSIS — J439 Emphysema, unspecified: Secondary | ICD-10-CM

## 2022-02-18 DIAGNOSIS — I5032 Chronic diastolic (congestive) heart failure: Secondary | ICD-10-CM

## 2022-02-18 DIAGNOSIS — I10 Essential (primary) hypertension: Secondary | ICD-10-CM

## 2022-02-18 DIAGNOSIS — E785 Hyperlipidemia, unspecified: Secondary | ICD-10-CM

## 2022-02-18 DIAGNOSIS — E1165 Type 2 diabetes mellitus with hyperglycemia: Secondary | ICD-10-CM

## 2022-02-21 ENCOUNTER — Other Ambulatory Visit (HOSPITAL_BASED_OUTPATIENT_CLINIC_OR_DEPARTMENT_OTHER): Payer: Self-pay

## 2022-02-23 ENCOUNTER — Ambulatory Visit: Payer: Self-pay

## 2022-02-23 ENCOUNTER — Encounter: Payer: Self-pay | Admitting: Internal Medicine

## 2022-02-23 ENCOUNTER — Ambulatory Visit (INDEPENDENT_AMBULATORY_CARE_PROVIDER_SITE_OTHER): Payer: Medicare Other | Admitting: Internal Medicine

## 2022-02-23 VITALS — BP 118/76 | HR 58 | Temp 97.9°F | Ht 67.0 in | Wt 223.7 lb

## 2022-02-23 DIAGNOSIS — D123 Benign neoplasm of transverse colon: Secondary | ICD-10-CM | POA: Diagnosis not present

## 2022-02-23 DIAGNOSIS — D509 Iron deficiency anemia, unspecified: Secondary | ICD-10-CM

## 2022-02-23 DIAGNOSIS — I482 Chronic atrial fibrillation, unspecified: Secondary | ICD-10-CM

## 2022-02-23 DIAGNOSIS — K219 Gastro-esophageal reflux disease without esophagitis: Secondary | ICD-10-CM

## 2022-02-23 DIAGNOSIS — I5032 Chronic diastolic (congestive) heart failure: Secondary | ICD-10-CM

## 2022-02-23 NOTE — Progress Notes (Signed)
Referring Provider: Maximiano Coss, NP Primary Care Physician:  Maximiano Coss, NP Primary GI:  Dr. Abbey Chatters  Chief Complaint  Patient presents with   Gastroesophageal Reflux    Follow up on GERD. Takes omeprazole '40mg'$  qam and doing well. Takes colace daily and has one stool per day. Takes iron tablet daily. No concerns today.     HPI:   Shaun [name] is a 66 y.o. male who presents to clinic today for follow-up visit.  History of iron deficiency anemia now improved, stable hemoglobin at 14.  Followed by PCP on oral iron.  EGD/TCS 06/2018: low-grade narrowing Schatzki ring which was dilated.  Gastritis.  32 polyps removed from the colon, 10 were tubular adenomas.  Capsule endoscopy to complete GI work-up 2020 was normal.  On chronic Eliquis.  Colonoscopy 06/07/2021 with 4 small tubular adenomas removed.  Recommended recall 5 years.  Today, states he is doing well.  Does have chronic GERD.  Takes omeprazole 40 mg daily.  States his symptoms are well controlled.  No epigastric or chest pain.  No dysphagia/odynophagia.  Past Medical History:  Diagnosis Date   Anemia    Anxiety    Asthma    CAD (coronary artery disease)    DES to distal circumflex 2016   Cataract    Colon polyps    30 colon polyps found on first colonoscopy   Diastolic heart failure (HCC)    Diverticulitis    DJD (degenerative joint disease)    GERD (gastroesophageal reflux disease)    History of kidney stones    Hyperlipidemia    Hypertension    Insomnia    Obstructive sleep apnea 12/2009   01/26/2010 AHI 83/hr   Permanent atrial fibrillation ([place])    Onset 2006 paroxysmal then progressive to persistent   PUD (peptic ulcer disease)    1980s   RLS (restless legs syndrome)    Sinusitis    Skin cancer    Type 2 diabetes mellitus ([place])     Past Surgical History:  Procedure Laterality Date   BIOPSY  07/17/2018   Procedure: BIOPSY;  Surgeon: Danie Binder, MD;  [facility];   Service: Endoscopy;;  colon   BOWEL RESECTION  09/17/2018   SMALL BOWEL RESECTION: 71 CM    CARDIAC CATHETERIZATION N/A 07/21/2015   Procedure: Left Heart Cath and Coronary Angiography;  Surgeon: Peter M Martinique, MD;  Location: [facility] [facility];  Service: Cardiovascular;  Laterality: N/A;   CARDIAC CATHETERIZATION N/A 07/21/2015   Procedure: Coronary Stent Intervention;  Surgeon: Peter M Martinique, MD;  Location: [facility] [facility];  Service: Cardiovascular;  Laterality: N/A;   CIRCUMCISION N/A 04/05/2019   Procedure: CIRCUMCISION ADULT;  Surgeon: Irine Seal, MD;  [facility];  Service: Urology;  Laterality: N/A;   COLONOSCOPY N/A 05/19/2014   Dr. Barnie Alderman diverticulosis/moderate external hemorrhoids, >20 simple adenomas. Genetic screening negative.    COLONOSCOPY WITH PROPOFOL N/A 07/17/2018   Dr. Oneida Alar: Diverticulosis, external/internal hemorrhoids, 32 colon polyps removed.  ten tubular adenomas removed with no high-grade dysplasia.  Advised to have surveillance colonoscopy in 3 years.   COLONOSCOPY WITH PROPOFOL N/A 06/07/2021   Procedure: COLONOSCOPY WITH PROPOFOL;  Surgeon: Eloise Harman, DO;  [facility];  Service: Endoscopy;  Laterality: N/A;  9:30 / ASA 3  (Pt was told that his time will be given at Pre-op)   ESOPHAGOGASTRODUODENOSCOPY (EGD) WITH PROPOFOL N/A 07/17/2018   Dr. Oneida Alar: Low-grade narrowing Schatzki ring at  the GE junction status post dilation.  Gastritis.  Biopsy with mild nonspecific reactive gastropathy.  No H. pylori.   GIVENS CAPSULE STUDY N/A 06/24/2019   normal   HERNIA REPAIR  1986   Left inguinal   INTRAVASCULAR PRESSURE WIRE/FFR STUDY Left 06/08/2017   Procedure: INTRAVASCULAR PRESSURE WIRE/FFR STUDY;  Surgeon: Nelva Bush, MD;  Location: [facility];  Service: Cardiovascular;  Laterality: Left;  LAD and CFX   LAPAROTOMY N/A 09/17/2018   Procedure: EXPLORATORY LAPAROTOMY;  Surgeon: Virl Cagey, MD;  [facility];   Service: General;  Laterality: N/A;   LEFT HEART CATH AND CORONARY ANGIOGRAPHY N/A 06/08/2017   Procedure: LEFT HEART CATH AND CORONARY ANGIOGRAPHY;  Surgeon: Nelva Bush, MD;  Location: [facility];  Service: Cardiovascular;  Laterality: N/A;   POLYPECTOMY  07/17/2018   Procedure: POLYPECTOMY;  Surgeon: Danie Binder, MD;  [facility];  Service: Endoscopy;;  colon   POLYPECTOMY  06/07/2021   Procedure: POLYPECTOMY INTESTINAL;  Surgeon: Eloise Harman, DO;  [facility];  Service: Endoscopy;;   ROTATOR CUFF REPAIR     Right   SAVORY DILATION N/A 07/17/2018   Procedure: SAVORY DILATION;  Surgeon: Danie Binder, MD;  [facility];  Service: Endoscopy;  Laterality: N/A;    Current Outpatient Medications  Medication Sig Dispense Refill   acetaminophen (TYLENOL) 325 MG tablet Take 2 tablets (650 mg total) by mouth every 6 (six) hours as needed for mild pain.     albuterol (VENTOLIN HFA) 108 (90 Base) MCG/ACT inhaler INHALE 2 PUFFS INTO THE LUNGS EVERY 6 HOURS AS NEEDED FOR WHEEZING OR SHORTNESS OF BREATH 18 g 11   apixaban (ELIQUIS) 5 MG TABS tablet TAKE 1 TABLET (5 MG) BY MOUTH TWICE DAILY 180 tablet 2   atorvastatin (LIPITOR) 80 MG tablet Take 1 tablet (80 mg total) by mouth daily. TAKE 1 TABLET(80 MG) BY MOUTH EVERY EVENING 90 tablet 3   azelastine (ASTELIN) 0.1 % nasal spray Place 1 spray into both nostrils 2 (two) times daily. Use in each nostril as directed 30 mL 12   buPROPion (WELLBUTRIN XL) 150 MG 24 hr tablet Take 3 tablets once daily 270 tablet 3   docusate sodium (COLACE) 100 MG capsule Take 100 mg by mouth daily as needed for mild constipation.     escitalopram (LEXAPRO) 20 MG tablet TAKE 1 TABLET(20 MG) BY MOUTH DAILY 90 tablet 0   ezetimibe (ZETIA) 10 MG tablet TAKE 1 TABLET BY MOUTH EVERY DAY 90 tablet 3   ferrous sulfate 325 (65 FE) MG tablet Take 325 mg by mouth daily with breakfast.     furosemide (LASIX) 40 MG tablet Take 1  tablet (40 mg total) by mouth daily. 90 tablet 3   hydrOXYzine (ATARAX) 10 MG tablet Take 1 tablet (10 mg total) by mouth 2 (two) times daily as needed for itching. 30 tablet 0   magnesium gluconate (MAGONATE) 500 MG tablet Take 0.5 tablets (250 mg total) by mouth at bedtime. 30 tablet 0   metFORMIN (GLUCOPHAGE) 500 MG tablet TAKE 1 TABLET (500 MG) BY MOUTH TWICE DAILY WITH A MEAL. 180 tablet 0   Metoprolol Tartrate 75 MG TABS Take 75 mg by mouth 2 (two) times daily. 60 tablet 0   nitroGLYCERIN (NITROSTAT) 0.4 MG SL tablet Place 1 tablet (0.4 mg total) under the tongue every 5 (five) minutes as needed. 25 tablet 3   omeprazole (PRILOSEC) 40 MG capsule Take  1 capsule by mouth once daily 30 minutes before breakfast 90 capsule 3   potassium chloride SA (KLOR-CON M) 20 MEQ tablet Take 1 tablet (20 mEq total) by mouth daily. 90 tablet 3   QUEtiapine (SEROQUEL) 50 MG tablet Take 1 tablet (50 mg total) by mouth at bedtime. 90 tablet 0   rOPINIRole (REQUIP) 3 MG tablet TAKE 1 TABLET BY MOUTH EVERY NIGHT AT BEDTIME 450 tablet 0   sildenafil (VIAGRA) 100 MG tablet TAKE 1 TABLET BY MOUTH 30 MINUTES BEFORE ACTIVITY 8 tablet 0   sucralfate (CARAFATE) 1 g tablet Take one tablet po BID PRN (Patient taking differently: Take 1 g by mouth 2 (two) times daily as needed (acid reflux).) 42 tablet 5   traMADol (ULTRAM) 50 MG tablet TAKE 1 TABLET BY MOUTH TWICE DAILY AS NEEDED FOR MODERATE PAIN. 60 tablet 4   triamcinolone ointment (KENALOG) 0.1 % Apply 1 application. topically 2 (two) times daily. 90 g 1   No current facility-administered medications for this visit.    Allergies as of 02/23/2022   (No Known Allergies)    Family History  Problem Relation Age of Onset   Hypertension Mother    Breast cancer Mother 30       brain/bone    Heart attack Father    Skin cancer Sister 48   Diabetes Brother    Parkinson's disease Brother    Brain cancer Maternal Uncle    Cancer Maternal Uncle        NOS   Breast  cancer Cousin        maternal cousin dx <50   Cancer Cousin    Colon cancer Neg Hx     Social History   Socioeconomic History   Marital status: Married    Spouse name: [name] Donaldson   Number of children: 5   Years of education: 12   Highest education level: 12th grade  Occupational History   Occupation: employed    Fish farm manager: [employer], [employer]    Comment: full-time  Tobacco Use   Smoking status: Former    Packs/day: 0.50    Years: 39.00    Total pack years: 19.50    Types: Cigarettes    Start date: 07/24/1970    Quit date: 08/06/2021    Years since quitting: 0.5    Passive exposure: Past   Smokeless tobacco: Never  Vaping Use   Vaping Use: Never used  Substance and Sexual Activity   Alcohol use: No    Alcohol/week: 0.0 standard drinks of alcohol   Drug use: No   Sexual activity: Yes    Partners: Female  Other Topics Concern   Not on file  Social History Narrative   Not on file   Social Determinants of Health   Financial Resource Strain: Low Risk  (02/02/2022)   Overall Financial Resource Strain (CARDIA)    Difficulty of Paying Living Expenses: Not hard at all  Food Insecurity: No Food Insecurity (02/02/2022)   Hunger Vital Sign    Worried About Running Out of Food in the Last Year: Never true    [place] in the Last Year: Never true  Transportation Needs: No Transportation Needs (02/02/2022)   PRAPARE - Hydrologist (Medical): No    Lack of Transportation (Non-Medical): No  Physical Activity: Sufficiently Active (02/02/2022)   Exercise Vital Sign    Days of Exercise per Week: 5 days    Minutes of Exercise per Session: 40 min  Recent Concern: Physical Activity - Inactive (11/04/2021)   Exercise Vital Sign    Days of Exercise per Week: 0 days    Minutes of Exercise per Session: 0 min  Stress: No Stress Concern Present (02/02/2022)   [place]    Feeling of Stress : Not at  all  Social Connections: Moderately Isolated (02/02/2022)   Social Connection and Isolation Panel [NHANES]    Frequency of Communication with Friends and Family: Three times a week    Frequency of Social Gatherings with Friends and Family: Three times a week    Attends Religious Services: Never    Active Member of Clubs or Organizations: No    Attends Archivist Meetings: Never    Marital Status: Married    Subjective: Review of Systems  Constitutional:  Negative for chills and fever.  HENT:  Negative for congestion and hearing loss.   Eyes:  Negative for blurred vision and double vision.  Respiratory:  Negative for cough and shortness of breath.   Cardiovascular:  Negative for chest pain and palpitations.  Gastrointestinal:  Positive for heartburn. Negative for abdominal pain, blood in stool, constipation, diarrhea, melena and vomiting.  Genitourinary:  Negative for dysuria and urgency.  Musculoskeletal:  Negative for joint pain and myalgias.  Skin:  Negative for itching and rash.  Neurological:  Negative for dizziness and headaches.  Psychiatric/Behavioral:  Negative for depression. The patient is not nervous/anxious.      Objective: BP 118/76 (BP Location: Left Arm, Patient Position: Sitting, Cuff Size: Large)   Pulse (!) 58   Temp 97.9 F (36.6 C) (Oral)   Ht '5\' 7"'$  (1.702 m)   Wt 223 lb 11.2 oz (101.5 kg)   BMI 35.04 kg/m  Physical Exam Constitutional:      Appearance: Normal appearance.  HENT:     Head: Normocephalic and atraumatic.  Eyes:     Extraocular Movements: Extraocular movements intact.     Conjunctiva/sclera: Conjunctivae normal.  Cardiovascular:     Rate and Rhythm: Normal rate and regular rhythm.  Pulmonary:     Effort: Pulmonary effort is normal.     Breath sounds: Normal breath sounds.  Abdominal:     General: Bowel sounds are normal.     Palpations: Abdomen is soft.  Musculoskeletal:        General: Normal range of motion.     Cervical  back: Normal range of motion and neck supple.  Skin:    General: Skin is warm.  Neurological:     General: No focal deficit present.     Mental Status: He is alert and oriented to person, place, and time.  Psychiatric:        Mood and Affect: Mood normal.        Behavior: Behavior normal.      Assessment: *Chronic GERD-well-controlled on omeprazole *Adenomatous colon polyps *Iron deficiency anemia-improved on oral iron  Plan: GERD well-controlled on omeprazole.  We will continue.  Year supply sent to pharmacy this month.  Colonoscopy recall October 2027.  Follow-up in 1 year or sooner if needed  02/23/2022 11:00 AM   Disclaimer: This note was dictated with voice recognition software. Similar sounding words can inadvertently be transcribed and may not be corrected upon review.

## 2022-02-23 NOTE — Patient Instructions (Signed)
I am happy to hear that you are doing well.    Continue on omeprazole for your chronic reflux.  We just sent you in a year supply this month.  You will be due for another colonoscopy October 2027.  Otherwise follow-up in 1 year or sooner if needed.  It was great seeing you again today.  Dr. Abbey Chatters

## 2022-02-23 NOTE — Chronic Care Management (AMB) (Signed)
Chronic Care Management   CCM RN Visit Note  02/23/2022 Name: Shaun [name] MRN: [id number] DOB: 1956-02-22  Subjective: Shaun [name] is a 66 y.o. year old male who is a primary care patient of Shaun Coss, NP. The care management team was consulted for assistance with disease management and care coordination needs.    Engaged with patient by telephone for follow up visit in response to provider referral for case management and/or care coordination services.   Consent to Services:  The patient was given information about Chronic Care Management services, agreed to services, and gave verbal consent prior to initiation of services.  Please see initial visit note for detailed documentation.   Patient agreed to services and verbal consent obtained.   Assessment: Review of patient past medical history, allergies, medications, health status, including review of consultants reports, laboratory and other test data, was performed as part of comprehensive evaluation and provision of chronic care management services.   SDOH (Social Determinants of Health) assessments and interventions performed:    CCM Care Plan  No Known Allergies  Outpatient Encounter Medications as of 02/23/2022  Medication Sig Note   acetaminophen (TYLENOL) 325 MG tablet Take 2 tablets (650 mg total) by mouth every 6 (six) hours as needed for mild pain.    albuterol (VENTOLIN HFA) 108 (90 Base) MCG/ACT inhaler INHALE 2 PUFFS INTO THE LUNGS EVERY 6 HOURS AS NEEDED FOR WHEEZING OR SHORTNESS OF BREATH    apixaban (ELIQUIS) 5 MG TABS tablet TAKE 1 TABLET (5 MG) BY MOUTH TWICE DAILY    atorvastatin (LIPITOR) 80 MG tablet Take 1 tablet (80 mg total) by mouth daily. TAKE 1 TABLET(80 MG) BY MOUTH EVERY EVENING    azelastine (ASTELIN) 0.1 % nasal spray Place 1 spray into both nostrils 2 (two) times daily. Use in each nostril as directed    buPROPion (WELLBUTRIN XL) 150 MG 24 hr tablet Take 3 tablets once daily     docusate sodium (COLACE) 100 MG capsule Take 100 mg by mouth daily as needed for mild constipation.    escitalopram (LEXAPRO) 20 MG tablet TAKE 1 TABLET(20 MG) BY MOUTH DAILY    ezetimibe (ZETIA) 10 MG tablet TAKE 1 TABLET BY MOUTH EVERY DAY    ferrous sulfate 325 (65 FE) MG tablet Take 325 mg by mouth daily with breakfast.    furosemide (LASIX) 40 MG tablet Take 1 tablet (40 mg total) by mouth daily.    hydrOXYzine (ATARAX) 10 MG tablet Take 1 tablet (10 mg total) by mouth 2 (two) times daily as needed for itching.    magnesium gluconate (MAGONATE) 500 MG tablet Take 0.5 tablets (250 mg total) by mouth at bedtime.    metFORMIN (GLUCOPHAGE) 500 MG tablet TAKE 1 TABLET (500 MG) BY MOUTH TWICE DAILY WITH A MEAL.    Metoprolol Tartrate 75 MG TABS Take 75 mg by mouth 2 (two) times daily.    nitroGLYCERIN (NITROSTAT) 0.4 MG SL tablet Place 1 tablet (0.4 mg total) under the tongue every 5 (five) minutes as needed.    omeprazole (PRILOSEC) 40 MG capsule Take 1 capsule by mouth once daily 30 minutes before breakfast    potassium chloride SA (KLOR-CON M) 20 MEQ tablet Take 1 tablet (20 mEq total) by mouth daily.    QUEtiapine (SEROQUEL) 50 MG tablet Take 1 tablet (50 mg total) by mouth at bedtime.    rOPINIRole (REQUIP) 3 MG tablet TAKE 1 TABLET BY MOUTH EVERY NIGHT AT BEDTIME    sildenafil (  VIAGRA) 100 MG tablet TAKE 1 TABLET BY MOUTH 30 MINUTES BEFORE ACTIVITY    sucralfate (CARAFATE) 1 g tablet Take one tablet po BID PRN (Patient taking differently: Take 1 g by mouth 2 (two) times daily as needed (acid reflux).)    traMADol (ULTRAM) 50 MG tablet TAKE 1 TABLET BY MOUTH TWICE DAILY AS NEEDED FOR MODERATE PAIN. 12/07/2021: Prescribed by Shaun Coss, NP   triamcinolone ointment (KENALOG) 0.1 % Apply 1 application. topically 2 (two) times daily.    No facility-administered encounter medications on file as of 02/23/2022.    Patient Active Problem List   Diagnosis Date Noted   Debility 10/21/2021    Acute respiratory failure ([place]) 08/25/2021   Sundowning 08/23/2021   Dysphagia 08/22/2021   Hypernatremia 08/22/2021   Obesity (BMI 30-39.9) 08/21/2021   Endotracheally intubated    Influenza    Acute respiratory failure with hypoxemia ([place]) 08/06/2021   Ventral hernia 12/31/2020   Nausea with vomiting 12/31/2019   Dyslipidemia, goal LDL below 70 06/18/2019   Hx of adenomatous colonic polyps 10/17/2018   Anemia 10/17/2018   Phimosis of penis 09/27/2018   Primary osteoarthritis of right hip 06/20/2018   Esophageal dysphagia 05/21/2018   GERD (gastroesophageal reflux disease) 05/21/2018   RLS (restless legs syndrome)    PUD (peptic ulcer disease)    Insomnia    Chronic diastolic CHF (congestive heart failure) ([place])    Asthma    Arteriosclerotic cardiovascular disease (ASCVD)    Chronic anticoagulation 01/27/2017   Depression 09/24/2015   COPD (chronic obstructive pulmonary disease) ([place]) 07/20/2015   Accelerating angina ([place]) 07/19/2015   Genetic testing 09/09/2014   H/O adenomatous polyp of colon 08/11/2014   Non-insulin treated type 2 diabetes mellitus ([place]) 02/09/2011   CAD S/P percutaneous coronary angioplasty    Tobacco abuse    Essential hypertension    Chronic atrial fibrillation ([place]) 02/23/2010   Obstructive sleep apnea 12/27/2009    Conditions to be addressed/monitored:Atrial Fibrillation, CHF, CAD, and HTN  Care Plan : RN Care Manager Plan of Care  Updates made by Dimitri Ped, RN since 02/23/2022 12:00 AM  Completed 02/23/2022   Problem: Chronic Disease Management and Care Coordination Needs (DM,CHF, CAD, HTN, Atrial Fib,COPD, depression, HLD) Resolved 02/23/2022  Priority: High     Long-Range Goal: Establish Plan of Care for Chronic Disease Management Needs (DM,CHF, CAD, HTN, Atrial Fib,COPD, depression, HLD) Completed 02/23/2022  Start Date: 09/22/2021  Expected End Date: 09/19/2022  Priority: High  Note:   Case closed goals met Current Barriers:   Knowledge Deficits related to plan of care for management of Atrial Fibrillation, CHF, CAD, HTN, HLD, COPD, DMII, and Depression  Chronic Disease Management support and education needs related to Atrial Fibrillation, CHF, CAD, HTN, HLD, COPD, DMII, and Depression  No Advanced Directives in place States that he has good and bad nights for sleeping.   States he completed PT and he is feeling stronger and doing more.  States he is trying to walk his dog and tries to walk at least 1 hr a day.  States his CBGs are ranging 100-149 at different times of the day Denies any low readings.  States he is weighting daily and his weight is stable was 220 today.   States he has had a few times when he needed to take extra fluid pills for increased weight.  States he might not have been watching how much sodium he was eating.  Denies any chest pains or swelling.  States  he gets winded if he walks too fast and thinks it is from his A Fib not his lungs. States he can do more before he gets winded.  Denies any coughing or choking.  States he is trying to eat healthy.  States his B/P has ranging 120-122/68-72.  States his rt hip pain is better since he got a shot and going to PT. States his itching is better now  RNCM Clinical Goal(s):  Patient will verbalize understanding of plan for management of Atrial Fibrillation, CHF, CAD, HTN, HLD, COPD, DMII, and Depression as evidenced by voiced adherence to plan of care verbalize basic understanding of  Atrial Fibrillation, CHF, CAD, HTN, HLD, COPD, DMII, and Depression disease process and self health management plan as evidenced by voiced understanding and teach back take all medications exactly as prescribed and will call provider for medication related questions as evidenced by dispense report and pt verbalization attend all scheduled medical appointments: GI 02/23/22, Cardiology 02/25/22, Kathrin Ruddy NP 04/19/22, Dr. Letta Pate 04/07/22, Annual Wellness 02/09/23  Visit as evidenced  by medical records demonstrate Improved adherence to prescribed treatment plan for Atrial Fibrillation, CHF, CAD, HTN, HLD, COPD, DMII, and Depression as evidenced by readings within limits, voiced adherence to plan of care continue to work with RN Care Manager to address care management and care coordination needs related to  Atrial Fibrillation, CHF, CAD, HTN, HLD, COPD, DMII, and Depression as evidenced by adherence to CM Team Scheduled appointments work with Education officer, museum to address  related to the management of Mental Health Concerns  related to the management of Atrial Fibrillation, CHF, CAD, HTN, HLD, COPD, DMII, and Depression as evidenced by review of EMR and patient or Education officer, museum report through collaboration with Consulting civil engineer, provider, and care team.   Interventions: 1:1 collaboration with primary care provider regarding development and update of comprehensive plan of care as evidenced by provider attestation and co-signature Inter-disciplinary care team collaboration (see longitudinal plan of care) Evaluation of current treatment plan related to  self management and patient's adherence to plan as established by provider   Insomnia/depression  (Status:  Goal Met.)  Long Term Goal Evaluation of current treatment plan related to Depression and insomnia , Mental Health Concerns  self-management and patient's adherence to plan as established by provider. Discussed plans with patient for ongoing care management follow up and provided patient with direct contact information for care management team Evaluation of current treatment plan related to Insomnia and patient's adherence to plan as established by provider Provided education to patient re: insomnia and sleep hygeine Social Work referral for depression and insomnia post hospitalization working with LCSW Reinforced good sleep hygiene habits    AFIB Interventions: (Status:  Goal Met.) Long Term Goal   Counseled on increased risk of  stroke due to Afib and benefits of anticoagulation for stroke prevention Reviewed importance of adherence to anticoagulant exactly as prescribed Counseled on bleeding risk associated with Eliquis and importance of self-monitoring for signs/symptoms of bleeding Counseled on avoidance of NSAIDs due to increased bleeding risk with anticoagulants Afib action plan reviewed Reinforce fall safety and use of anticoagulant. Reinforced to check pulse regularly and notify cardiology if pulse remains elevated    CAD Interventions: (Status:  Goal Met.) Long Term Goal Assessed understanding of CAD diagnosis Medications reviewed including medications utilized in CAD treatment plan Provided education on importance of blood pressure control in management of CAD Provided education on Importance of limiting foods high in cholesterol Reviewed Importance of taking  all medications as prescribed Reviewed s/sx of CAD and when to call 911 or provider. Reinforced to continue to remain tobacco free   Heart Failure Interventions:  (Status:  Goal Met.) Long Term Goal Basic overview and discussion of pathophysiology of Heart Failure reviewed Provided education on low sodium diet Reviewed Heart Failure Action Plan in depth and provided written copy Discussed importance of daily weight and advised patient to weigh and record daily Reviewed role of diuretics in prevention of fluid overload and management of heart failure; Discussed the importance of keeping all appointments with provider Reviewed importance of following a low sodium diet. Reinforced to pace his activity. Reinforced s/sx of HF and when to call provider  COPD Interventions:  (Status:  Goal Met.) Long Term Goal Advised patient to track and manage COPD triggers Provided instruction about proper use of medications used for management of COPD including inhalers Advised patient to engage in light exercise as tolerated 3-5 days a week to aid in the the  management of COPD Provided education about and advised patient to utilize infection prevention strategies to reduce risk of respiratory infection Reinforced to remain tobacco free   Diabetes Interventions:  (Status:  Goal Met.) Long Term Goal Assessed patient's understanding of A1c goal: <7% Provided education to patient about basic DM disease process Reviewed medications with patient and discussed importance of medication adherence Discussed plans with patient for ongoing care management follow up and provided patient with direct contact information for care management team Provided patient with written educational materials related to hypo and hyperglycemia and importance of correct treatment Advised patient, providing education and rationale, to check cbg daily and record, calling provider for findings outside established parameters  Reviewed to avoid sweets and concentrated sugars.Reinforced importance of regular exercise to help with blood sugar control Lab Results  Component Value Date   HGBA1C 6.3 10/20/2021   Hyperlipidemia Interventions:  (Status:  Goal Met.) Long Term Goal Medication review performed; medication list updated in electronic medical record.  Counseled on importance of regular laboratory monitoring as prescribed Reviewed role and benefits of statin for ASCVD risk reduction Reviewed importance of limiting foods high in cholesterol Reviewed exercise goals and target of 150 minutes per week  Hypertension Interventions:  (Status:  Goal Met.) Long Term Goal Last practice recorded BP readings:  BP Readings from Last 3 Encounters:  02/08/22 118/77  01/13/22 122/68  12/07/21 110/79  Most recent eGFR/CrCl:  Lab Results  Component Value Date   EGFR 96 06/28/2021    No components found for: CRCL  Evaluation of current treatment plan related to hypertension self management and patient's adherence to plan as established by provider Provided education to patient re:  stroke prevention, s/s of heart attack and stroke Reviewed medications with patient and discussed importance of compliance Discussed plans with patient for ongoing care management follow up and provided patient with direct contact information for care management team Advised patient, providing education and rationale, to monitor blood pressure daily and record, calling PCP for findings outside established parameters Provided education on prescribed diet low sodium low CHO heart healthy Reinforced to drink adequate amounts of fluids to keep his B/P steady.  Reviewed to call his provider if he has frequent low B/P  Patient Goals/Self-Care Activities: Take all medications as prescribed Attend all scheduled provider appointments Perform all self care activities independently  Call provider office for new concerns or questions  call office if I gain more than 2 pounds in one day or 5 pounds  in one week keep legs up while sitting watch for swelling in feet, ankles and legs every day weigh myself daily follow rescue plan if symptoms flare-up keep appointment with eye doctor check blood sugar at prescribed times: once daily and when you have symptoms of low or high blood sugar check feet daily for cuts, sores or redness take the blood sugar log to all doctor visits drink 6 to 8 glasses of water each day fill half of plate with vegetables manage portion size switch to sugar-free drinks keep feet up while sitting wash and dry feet carefully every day eliminate smoking in my home identify and remove indoor air pollutants limit outdoor activity during cold weather eliminate symptom triggers at home check pulse (heart) rate once a day make a plan to exercise regularly make a plan to eat healthy check blood pressure daily choose a place to take my blood pressure (home, clinic or office, retail store) take blood pressure log to all doctor appointments take medications for blood pressure exactly  as prescribed report new symptoms to your doctor eat more whole grains, fruits and vegetables, lean meats and healthy fats limit salt intake to 2380m/day call for medicine refill 2 or 3 days before it runs out take all medications exactly as prescribed call doctor with any symptoms you believe are related to your medicine  Follow Up Plan:  The patient has been provided with contact information for the care management team and has been advised to call with any health related questions or concerns.  No further follow up required: Case closed goals met       Plan:The patient has been provided with contact information for the care management team and has been advised to call with any health related questions or concerns.  No further follow up required: Case closed goals met MPeter GarterRN, BM[facility] CDE Care Management Coordinator [facility] ([phone]

## 2022-02-23 NOTE — Patient Instructions (Signed)
Visit Information Case closed goals met Thank you for allowing me to share the care management and care coordination services that are available to you as part of your health plan and services through your primary care provider and medical home. Please reach out to me at [phone] if the care management/care coordination team may be of assistance to you in the future.   Peter Garter RN, Jackquline Denmark, CDE Care Management Coordinator [facility] [phone]

## 2022-02-24 ENCOUNTER — Other Ambulatory Visit: Payer: Self-pay | Admitting: Registered Nurse

## 2022-02-24 ENCOUNTER — Other Ambulatory Visit (HOSPITAL_BASED_OUTPATIENT_CLINIC_OR_DEPARTMENT_OTHER): Payer: Self-pay

## 2022-02-24 MED ORDER — SILDENAFIL CITRATE 100 MG PO TABS
ORAL_TABLET | ORAL | 0 refills | Status: DC
  Filled 2022-02-24: qty 8, 8d supply, fill #0

## 2022-02-25 ENCOUNTER — Encounter: Payer: Self-pay | Admitting: Cardiology

## 2022-02-25 ENCOUNTER — Ambulatory Visit: Payer: Medicare Other | Admitting: Cardiology

## 2022-02-25 VITALS — BP 132/78 | HR 79 | Ht 68.0 in | Wt 223.2 lb

## 2022-02-25 DIAGNOSIS — I25118 Atherosclerotic heart disease of native coronary artery with other forms of angina pectoris: Secondary | ICD-10-CM | POA: Diagnosis not present

## 2022-02-25 DIAGNOSIS — E1159 Type 2 diabetes mellitus with other circulatory complications: Secondary | ICD-10-CM | POA: Diagnosis not present

## 2022-02-25 DIAGNOSIS — E782 Mixed hyperlipidemia: Secondary | ICD-10-CM

## 2022-02-25 DIAGNOSIS — Z7984 Long term (current) use of oral hypoglycemic drugs: Secondary | ICD-10-CM | POA: Diagnosis not present

## 2022-02-25 DIAGNOSIS — I110 Hypertensive heart disease with heart failure: Secondary | ICD-10-CM

## 2022-02-25 DIAGNOSIS — I4891 Unspecified atrial fibrillation: Secondary | ICD-10-CM

## 2022-02-25 DIAGNOSIS — J449 Chronic obstructive pulmonary disease, unspecified: Secondary | ICD-10-CM | POA: Diagnosis not present

## 2022-02-25 DIAGNOSIS — I2510 Atherosclerotic heart disease of native coronary artery without angina pectoris: Secondary | ICD-10-CM

## 2022-02-25 DIAGNOSIS — E785 Hyperlipidemia, unspecified: Secondary | ICD-10-CM | POA: Diagnosis not present

## 2022-02-25 DIAGNOSIS — I5032 Chronic diastolic (congestive) heart failure: Secondary | ICD-10-CM

## 2022-02-25 DIAGNOSIS — I4821 Permanent atrial fibrillation: Secondary | ICD-10-CM | POA: Diagnosis not present

## 2022-02-25 DIAGNOSIS — I10 Essential (primary) hypertension: Secondary | ICD-10-CM | POA: Diagnosis not present

## 2022-02-25 DIAGNOSIS — I509 Heart failure, unspecified: Secondary | ICD-10-CM | POA: Diagnosis not present

## 2022-02-25 NOTE — Patient Instructions (Signed)
Medication Instructions:  Continue same medications *If you need a refill on your cardiac medications before your next appointment, please call your pharmacy*   Lab Work: None ordered   Testing/Procedures: None ordered   Follow-Up: At [facility], you and your health needs are our priority.  As part of our continuing mission to provide you with exceptional heart care, we have created designated Provider Care Teams.  These Care Teams include your primary Cardiologist (physician) and Advanced Practice Providers (APPs -  Physician Assistants and Nurse Practitioners) who all work together to provide you with the care you need, when you need it.  We recommend signing up for the patient portal called "MyChart".  Sign up information is provided on this After Visit Summary.  MyChart is used to connect with patients for Virtual Visits (Telemedicine).  Patients are able to view lab/test results, encounter notes, upcoming appointments, etc.  Non-urgent messages can be sent to your provider as well.   To learn more about what you can do with MyChart, go to [email or web address].      Your next appointment:  1 year     The format for your next appointment: Office   Provider:  Dr.Crenshaw    Important Information About Sugar       

## 2022-02-28 ENCOUNTER — Other Ambulatory Visit: Payer: Self-pay | Admitting: Registered Nurse

## 2022-02-28 ENCOUNTER — Other Ambulatory Visit (HOSPITAL_BASED_OUTPATIENT_CLINIC_OR_DEPARTMENT_OTHER): Payer: Self-pay

## 2022-02-28 NOTE — Telephone Encounter (Signed)
Patient is requesting a refill of the following medications: Requested Prescriptions   Pending Prescriptions Disp Refills   traMADol (ULTRAM) 50 MG tablet 60 tablet 4    Sig: TAKE 1 TABLET BY MOUTH TWICE DAILY AS NEEDED FOR MODERATE PAIN.    Date of patient request: 02/28/22 Last office visit: 01/13/22 Date of last refill: 09/30/21 Last refill amount: 60

## 2022-03-01 MED ORDER — TRAMADOL HCL 50 MG PO TABS
ORAL_TABLET | ORAL | 0 refills | Status: DC
  Filled 2022-03-01: qty 60, 30d supply, fill #0

## 2022-03-02 ENCOUNTER — Other Ambulatory Visit (HOSPITAL_BASED_OUTPATIENT_CLINIC_OR_DEPARTMENT_OTHER): Payer: Self-pay

## 2022-03-18 ENCOUNTER — Other Ambulatory Visit (HOSPITAL_BASED_OUTPATIENT_CLINIC_OR_DEPARTMENT_OTHER): Payer: Self-pay

## 2022-03-22 ENCOUNTER — Encounter: Payer: Self-pay | Admitting: Registered Nurse

## 2022-03-22 ENCOUNTER — Other Ambulatory Visit: Payer: Self-pay

## 2022-03-22 ENCOUNTER — Ambulatory Visit (INDEPENDENT_AMBULATORY_CARE_PROVIDER_SITE_OTHER): Payer: Medicare Other | Admitting: Registered Nurse

## 2022-03-22 ENCOUNTER — Other Ambulatory Visit (HOSPITAL_BASED_OUTPATIENT_CLINIC_OR_DEPARTMENT_OTHER): Payer: Self-pay

## 2022-03-22 VITALS — BP 128/64 | HR 85 | Temp 97.9°F | Resp 18 | Ht 68.0 in | Wt 226.4 lb

## 2022-03-22 DIAGNOSIS — M2550 Pain in unspecified joint: Secondary | ICD-10-CM | POA: Diagnosis not present

## 2022-03-22 MED ORDER — HYDROCODONE-ACETAMINOPHEN 5-325 MG PO TABS
1.0000 | ORAL_TABLET | Freq: Two times a day (BID) | ORAL | 0 refills | Status: DC | PRN
  Filled 2022-03-22: qty 60, 30d supply, fill #0

## 2022-03-22 MED ORDER — TIZANIDINE HCL 4 MG PO TABS
4.0000 mg | ORAL_TABLET | Freq: Two times a day (BID) | ORAL | 0 refills | Status: DC | PRN
  Filled 2022-03-22: qty 60, 30d supply, fill #0

## 2022-03-22 NOTE — Patient Instructions (Addendum)
Mr. Klemens -  It has been a pleasure taking part in your care.  I recommend these providers going forward:  Berniece Pap, MD Dimas Chyle, MD Agustina Caroli, MD Myrna Blazer Early, NP Jeralyn Ruths, DNP  I want to increase your pain medication and add a mild muscle relaxer. Please rotate use of these as they can be sedative.   Continue to follow with Dr. Letta Pate. I will communicate with him and see if he thinks we should get you into a spinal specialist.  Remember - stagger ou these medications to get the best coverage for your pain and best avoidance of side effects!  Thank you,  Rich   If you have lab work done today you will be contacted with your lab results within the next 2 weeks.  If you have not heard from [place] then please contact us. The fastest way to get your results is to register for My Chart.   IF you received an x-ray today, you will receive an invoice from [facility]. Please contact [facility] at [phone] with questions or concerns regarding your invoice.   IF you received labwork today, you will receive an invoice from [place]. Please contact [facility] at [phone] with questions or concerns regarding your invoice.   Our billing staff will not be able to assist you with questions regarding bills from these companies.  You will be contacted with the lab results as soon as they are available. The fastest way to get your results is to activate your My Chart account. Instructions are located on the last page of this paperwork. If you have not heard from [place] regarding the results in 2 weeks, please contact this office.

## 2022-03-22 NOTE — Progress Notes (Signed)
Established Patient Office Visit  Subjective:  Patient ID: Shaun [name], male    DOB: 27-Jul-1956  Age: 66 y.o. MRN: [id number]  CC:  Chief Complaint  Patient presents with   Pain    Patient states he is having chronic pain in neck , shoulder, back and knees for years and has now been switched to tramadol and does not seem to be helping.    HPI Shaun [name] presents for pain  Has had chronic neck, shoulder, back, and knee pain for a number of years.  Has been switched to tramadol for pain relief, but unfortunately this is not helping. He has also done quite a bit of PT and rehab both for pain and cardiac rehab. Unfortunately this has been limited in it's effect as well.  Most recently seen by Dr. Alysia Penna on February 08, 2022, when it was noted: "I do think he would be a good candidate for lumbar medial branch blocks as a diagnostic procedure to evaluate for potential radiofrequency neurotomy of the L3-L4 medial branches and L5 dorsal ramus." He will next follow up with Dr. Letta Pate on 04/07/22.  His last refill of tramadol was on 03/02/22 for #60 per pdmp review. No other controlled substances in past 2 years other than course of hydrocodone-acetaminophen on 04/02/20.  Outpatient Medications Prior to Visit  Medication Sig Dispense Refill   acetaminophen (TYLENOL) 325 MG tablet Take 2 tablets (650 mg total) by mouth every 6 (six) hours as needed for mild pain.     albuterol (VENTOLIN HFA) 108 (90 Base) MCG/ACT inhaler INHALE 2 PUFFS INTO THE LUNGS EVERY 6 HOURS AS NEEDED FOR WHEEZING OR SHORTNESS OF BREATH 18 g 11   apixaban (ELIQUIS) 5 MG TABS tablet TAKE 1 TABLET (5 MG) BY MOUTH TWICE DAILY 180 tablet 2   atorvastatin (LIPITOR) 80 MG tablet Take 1 tablet (80 mg total) by mouth daily. TAKE 1 TABLET(80 MG) BY MOUTH EVERY EVENING 90 tablet 3   azelastine (ASTELIN) 0.1 % nasal spray Place 1 spray into both nostrils 2 (two) times daily. Use in each nostril as directed 30 mL 12    buPROPion (WELLBUTRIN XL) 150 MG 24 hr tablet Take 3 tablets once daily 270 tablet 3   docusate sodium (COLACE) 100 MG capsule Take 100 mg by mouth daily as needed for mild constipation.     escitalopram (LEXAPRO) 20 MG tablet TAKE 1 TABLET(20 MG) BY MOUTH DAILY 90 tablet 0   ezetimibe (ZETIA) 10 MG tablet TAKE 1 TABLET BY MOUTH EVERY DAY 90 tablet 3   ferrous sulfate 325 (65 FE) MG tablet Take 325 mg by mouth daily with breakfast.     furosemide (LASIX) 40 MG tablet Take 1 tablet (40 mg total) by mouth daily. 90 tablet 3   hydrOXYzine (ATARAX) 10 MG tablet Take 1 tablet (10 mg total) by mouth 2 (two) times daily as needed for itching. 30 tablet 0   magnesium gluconate (MAGONATE) 500 MG tablet Take 0.5 tablets (250 mg total) by mouth at bedtime. 30 tablet 0   metFORMIN (GLUCOPHAGE) 500 MG tablet TAKE 1 TABLET (500 MG) BY MOUTH TWICE DAILY WITH A MEAL. 180 tablet 0   Metoprolol Tartrate 75 MG TABS Take 75 mg by mouth 2 (two) times daily. 60 tablet 0   nitroGLYCERIN (NITROSTAT) 0.4 MG SL tablet Place 1 tablet (0.4 mg total) under the tongue every 5 (five) minutes as needed. 25 tablet 3   omeprazole (PRILOSEC) 40 MG capsule Take  1 capsule by mouth once daily 30 minutes before breakfast 90 capsule 3   potassium chloride SA (KLOR-CON M) 20 MEQ tablet Take 1 tablet (20 mEq total) by mouth daily. 90 tablet 3   QUEtiapine (SEROQUEL) 50 MG tablet Take 1 tablet (50 mg total) by mouth at bedtime. 90 tablet 0   rOPINIRole (REQUIP) 3 MG tablet TAKE 1 TABLET BY MOUTH EVERY NIGHT AT BEDTIME 450 tablet 0   sildenafil (VIAGRA) 100 MG tablet TAKE 1 TABLET BY MOUTH 30 MINUTES BEFORE ACTIVITY 8 tablet 0   sucralfate (CARAFATE) 1 g tablet Take one tablet po BID PRN (Patient taking differently: Take 1 g by mouth 2 (two) times daily as needed (acid reflux).) 42 tablet 5   traMADol (ULTRAM) 50 MG tablet TAKE 1 TABLET BY MOUTH TWICE DAILY AS NEEDED FOR MODERATE PAIN. 60 tablet 0   triamcinolone ointment (KENALOG) 0.1 %  Apply 1 application. topically 2 (two) times daily. 90 g 1   No facility-administered medications prior to visit.    Review of Systems  Constitutional: Negative.   HENT: Negative.    Eyes: Negative.   Respiratory: Negative.    Cardiovascular: Negative.   Gastrointestinal: Negative.   Genitourinary: Negative.   Musculoskeletal: Negative.   Skin: Negative.   Neurological: Negative.   Psychiatric/Behavioral: Negative.    All other systems reviewed and are negative.     Objective:     BP 128/64   Pulse 85   Temp 97.9 F (36.6 C) (Temporal)   Resp 18   Ht '5\' 8"'$  (1.727 m)   Wt 226 lb 6.4 oz (102.7 kg)   SpO2 97%   BMI 34.42 kg/m   Wt Readings from Last 3 Encounters:  03/22/22 226 lb 6.4 oz (102.7 kg)  02/25/22 223 lb 3.2 oz (101.2 kg)  02/23/22 223 lb 11.2 oz (101.5 kg)   Physical Exam Constitutional:      General: He is not in acute distress.    Appearance: Normal appearance. He is normal weight. He is not ill-appearing, toxic-appearing or diaphoretic.  Cardiovascular:     Rate and Rhythm: Normal rate and regular rhythm.     Heart sounds: Normal heart sounds. No murmur heard.    No friction rub. No gallop.  Pulmonary:     Effort: Pulmonary effort is normal. No respiratory distress.     Breath sounds: Normal breath sounds. No stridor. No wheezing, rhonchi or rales.  Chest:     Chest wall: No tenderness.  Musculoskeletal:        General: No swelling or deformity.     Comments: Limited ROM in R hip, improved from previous. Limited ROM in flexion and abduction of shoulders, rotation of neck.   Neurological:     General: No focal deficit present.     Mental Status: He is alert and oriented to person, place, and time. Mental status is at baseline.  Psychiatric:        Mood and Affect: Mood normal.        Behavior: Behavior normal.        Thought Content: Thought content normal.        Judgment: Judgment normal.     No results found for any visits on  03/22/22.    The ASCVD Risk score (Arnett DK, et al., 2019) failed to calculate for the following reasons:   The valid total cholesterol range is 130 to 320 mg/dL    Assessment & Plan:   Problem List Items Addressed  This Visit   None Visit Diagnoses     Polyarthralgia    -  Primary   Relevant Medications   HYDROcodone-acetaminophen (NORCO/VICODIN) 5-325 MG tablet   tiZANidine (ZANAFLEX) 4 MG tablet       Meds ordered this encounter  Medications   HYDROcodone-acetaminophen (NORCO/VICODIN) 5-325 MG tablet    Sig: Take 1 tablet by mouth 2 (two) times daily as needed for moderate pain.    Dispense:  60 tablet    Refill:  0    Order Specific Question:   Supervising Provider    Answer:   Carlota Raspberry, JEFFREY R [[id number]]   tiZANidine (ZANAFLEX) 4 MG tablet    Sig: Take 1 tablet (4 mg total) by mouth 2 (two) times daily as needed for muscle spasms.    Dispense:  60 tablet    Refill:  0    Order Specific Question:   Supervising Provider    Answer:   Carlota Raspberry, JEFFREY R [[id number]]    Return if symptoms worsen or fail to improve.   PLAN Stop tramadol Start hydrocodone-acetaminophen 5-'325mg'$  po bid prn. Tizanidine '4mg'$  po bid prn Advised on risks, benefits, alternatives. Pt voices understanding.  Close follow up with rehab. He will continue home exercises as tolerated. Return as scheduled for 6 mo follow up. Patient encouraged to call clinic with any questions, comments, or concerns.  Maximiano Coss, NP

## 2022-03-23 ENCOUNTER — Other Ambulatory Visit (HOSPITAL_BASED_OUTPATIENT_CLINIC_OR_DEPARTMENT_OTHER): Payer: Self-pay

## 2022-03-25 ENCOUNTER — Other Ambulatory Visit: Payer: Self-pay | Admitting: Registered Nurse

## 2022-03-25 DIAGNOSIS — E119 Type 2 diabetes mellitus without complications: Secondary | ICD-10-CM

## 2022-03-26 MED ORDER — METOPROLOL TARTRATE 75 MG PO TABS
75.0000 mg | ORAL_TABLET | Freq: Two times a day (BID) | ORAL | 0 refills | Status: DC
  Filled 2022-03-26: qty 60, 30d supply, fill #0

## 2022-03-26 MED ORDER — ROPINIROLE HCL 3 MG PO TABS
ORAL_TABLET | Freq: Every day | ORAL | 0 refills | Status: DC
  Filled 2022-03-26: qty 450, fill #0
  Filled 2022-03-28: qty 90, 90d supply, fill #0

## 2022-03-26 MED ORDER — METFORMIN HCL 500 MG PO TABS
ORAL_TABLET | ORAL | 0 refills | Status: DC
  Filled 2022-03-26: qty 180, 90d supply, fill #0

## 2022-03-26 MED ORDER — SILDENAFIL CITRATE 100 MG PO TABS
ORAL_TABLET | ORAL | 0 refills | Status: DC
  Filled 2022-03-26: qty 8, 8d supply, fill #0

## 2022-03-28 ENCOUNTER — Other Ambulatory Visit (HOSPITAL_BASED_OUTPATIENT_CLINIC_OR_DEPARTMENT_OTHER): Payer: Self-pay

## 2022-03-30 ENCOUNTER — Telehealth: Payer: Medicare Other

## 2022-04-07 ENCOUNTER — Encounter: Payer: Medicare Other | Admitting: Physical Medicine & Rehabilitation

## 2022-04-11 ENCOUNTER — Other Ambulatory Visit: Payer: Self-pay | Admitting: Registered Nurse

## 2022-04-11 ENCOUNTER — Other Ambulatory Visit (HOSPITAL_BASED_OUTPATIENT_CLINIC_OR_DEPARTMENT_OTHER): Payer: Self-pay

## 2022-04-11 DIAGNOSIS — F5101 Primary insomnia: Secondary | ICD-10-CM

## 2022-04-11 MED ORDER — QUETIAPINE FUMARATE 50 MG PO TABS
50.0000 mg | ORAL_TABLET | Freq: Every day | ORAL | 0 refills | Status: DC
  Filled 2022-04-11: qty 90, 90d supply, fill #0

## 2022-04-11 NOTE — Telephone Encounter (Signed)
Patient is requesting a refill of the following medications: Requested Prescriptions   Pending Prescriptions Disp Refills   QUEtiapine (SEROQUEL) 50 MG tablet 90 tablet 0    Sig: Take 1 tablet (50 mg total) by mouth at bedtime.    Date of patient request: 04/11/22 Last office visit: 03/22/22 Date of last refill: 01/18/22 Last refill amount: 90

## 2022-04-19 ENCOUNTER — Encounter: Payer: Self-pay | Admitting: Family Medicine

## 2022-04-19 ENCOUNTER — Other Ambulatory Visit (HOSPITAL_BASED_OUTPATIENT_CLINIC_OR_DEPARTMENT_OTHER): Payer: Self-pay

## 2022-04-19 ENCOUNTER — Ambulatory Visit: Payer: Medicare Other | Admitting: Registered Nurse

## 2022-04-19 ENCOUNTER — Ambulatory Visit (INDEPENDENT_AMBULATORY_CARE_PROVIDER_SITE_OTHER): Payer: Medicare Other | Admitting: Family Medicine

## 2022-04-19 VITALS — BP 122/86 | HR 87 | Temp 97.4°F | Resp 17 | Ht 68.0 in | Wt 227.2 lb

## 2022-04-19 DIAGNOSIS — I10 Essential (primary) hypertension: Secondary | ICD-10-CM | POA: Diagnosis not present

## 2022-04-19 DIAGNOSIS — E785 Hyperlipidemia, unspecified: Secondary | ICD-10-CM

## 2022-04-19 DIAGNOSIS — G4700 Insomnia, unspecified: Secondary | ICD-10-CM

## 2022-04-19 DIAGNOSIS — Z23 Encounter for immunization: Secondary | ICD-10-CM

## 2022-04-19 DIAGNOSIS — G2581 Restless legs syndrome: Secondary | ICD-10-CM

## 2022-04-19 DIAGNOSIS — E119 Type 2 diabetes mellitus without complications: Secondary | ICD-10-CM

## 2022-04-19 DIAGNOSIS — F321 Major depressive disorder, single episode, moderate: Secondary | ICD-10-CM | POA: Insufficient documentation

## 2022-04-19 DIAGNOSIS — R7989 Other specified abnormal findings of blood chemistry: Secondary | ICD-10-CM

## 2022-04-19 LAB — CBC WITH DIFFERENTIAL/PLATELET
Basophils Absolute: 0 10*3/uL (ref 0.0–0.1)
Basophils Relative: 0.7 % (ref 0.0–3.0)
Eosinophils Absolute: 0.1 10*3/uL (ref 0.0–0.7)
Eosinophils Relative: 1.9 % (ref 0.0–5.0)
HCT: 38.9 % — ABNORMAL LOW (ref 39.0–52.0)
Hemoglobin: 12.9 g/dL — ABNORMAL LOW (ref 13.0–17.0)
Lymphocytes Relative: 25.6 % (ref 12.0–46.0)
Lymphs Abs: 1.7 10*3/uL (ref 0.7–4.0)
MCHC: 33.3 g/dL (ref 30.0–36.0)
MCV: 82.9 fl (ref 78.0–100.0)
Monocytes Absolute: 0.4 10*3/uL (ref 0.1–1.0)
Monocytes Relative: 6 % (ref 3.0–12.0)
Neutro Abs: 4.3 10*3/uL (ref 1.4–7.7)
Neutrophils Relative %: 65.8 % (ref 43.0–77.0)
Platelets: 186 10*3/uL (ref 150.0–400.0)
RBC: 4.69 Mil/uL (ref 4.22–5.81)
RDW: 15.2 % (ref 11.5–15.5)
WBC: 6.5 10*3/uL (ref 4.0–10.5)

## 2022-04-19 LAB — MICROALBUMIN / CREATININE URINE RATIO
Creatinine,U: 201 mg/dL
Microalb Creat Ratio: 0.4 mg/g (ref 0.0–30.0)
Microalb, Ur: 0.9 mg/dL (ref 0.0–1.9)

## 2022-04-19 LAB — BASIC METABOLIC PANEL
BUN: 22 mg/dL (ref 6–23)
CO2: 25 mEq/L (ref 19–32)
Calcium: 9.1 mg/dL (ref 8.4–10.5)
Chloride: 104 mEq/L (ref 96–112)
Creatinine, Ser: 1.07 mg/dL (ref 0.40–1.50)
GFR: 72.71 mL/min (ref >60.00)
Glucose, Bld: 123 mg/dL — ABNORMAL HIGH (ref 70–99)
Potassium: 5 mEq/L (ref 3.5–5.1)
Sodium: 139 mEq/L (ref 135–145)

## 2022-04-19 LAB — LDL CHOLESTEROL, DIRECT: Direct LDL: 83 mg/dL

## 2022-04-19 LAB — LIPID PANEL
Cholesterol: 129 mg/dL (ref 0–200)
HDL: 29.9 mg/dL — ABNORMAL LOW (ref >39.00)
NonHDL: 99.29
Total CHOL/HDL Ratio: 4
Triglycerides: 255 mg/dL — ABNORMAL HIGH (ref 0.0–149.0)
VLDL: 51 mg/dL — ABNORMAL HIGH (ref 0.0–40.0)

## 2022-04-19 LAB — HEPATIC FUNCTION PANEL
ALT: 23 U/L (ref 0–53)
AST: 17 U/L (ref 0–37)
Albumin: 4.1 g/dL (ref 3.5–5.2)
Alkaline Phosphatase: 92 U/L (ref 39–117)
Bilirubin, Direct: 0.1 mg/dL (ref 0.0–0.3)
Total Bilirubin: 0.5 mg/dL (ref 0.2–1.2)
Total Protein: 6.6 g/dL (ref 6.0–8.3)

## 2022-04-19 LAB — HEMOGLOBIN A1C: Hgb A1c MFr Bld: 7.5 % — ABNORMAL HIGH (ref 4.6–6.5)

## 2022-04-19 LAB — TSH: TSH: <0.01 u[IU]/mL — ABNORMAL LOW (ref 0.35–5.50)

## 2022-04-19 MED ORDER — ROPINIROLE HCL 3 MG PO TABS
ORAL_TABLET | ORAL | 0 refills | Status: DC
  Filled 2022-04-19: qty 45, fill #0
  Filled 2022-04-27 – 2022-05-22 (×4): qty 45, 30d supply, fill #0

## 2022-04-19 NOTE — Patient Instructions (Signed)
Please establish care w/ your new provider so there isn't a lapse in your care We'll notify you of your lab results and make any changes if needed Continue to work on low carb diet and regular physical activity such as walking Call with any questions or concerns Enjoy the rest of your summer!

## 2022-04-19 NOTE — Progress Notes (Signed)
   Subjective:    Patient ID: Shaun [name], male    DOB: Jan 05, 1956, 66 y.o.   MRN: [id number]  HPI DM- chronic problem, on Metformin '500mg'$  BID.  UTD on eye exam, foot exam done today.  Due for microalbumin.  Denies recent symptomatic lows.  No numbness/tingling of hands/feet.  HTN- chronic problem, on Metoprolol '75mg'$  BID, Lasix '40mg'$  daily w/ good control.  No CP, SOB, HAs, visual changes.    Hyperlipidemia- chronic problem, on Lipitor '80mg'$  daily and Zetia '10mg'$  daily.  No abd pain, N/V.  Insomnia- ongoing issue for pt.  Unclear if this is related to his ongoing depression.  Currently on Wellbutrin '450mg'$  daily, Lexapro '20mg'$  daily, Seroquel '50mg'$  daily, Requip '3mg'$  QHS.  Pt reports mood has been better.   Review of Systems For ROS see HPI     Objective:   Physical Exam Vitals reviewed.  Constitutional:      General: He is not in acute distress.    Appearance: Normal appearance. He is well-developed.  HENT:     Head: Normocephalic and atraumatic.  Eyes:     Extraocular Movements: Extraocular movements intact.     Conjunctiva/sclera: Conjunctivae normal.     Pupils: Pupils are equal, round, and reactive to light.  Neck:     Thyroid: No thyromegaly.  Cardiovascular:     Rate and Rhythm: Normal rate. Rhythm irregular.     Pulses: Normal pulses.     Heart sounds: Normal heart sounds. No murmur heard. Pulmonary:     Effort: Pulmonary effort is normal. No respiratory distress.     Breath sounds: Normal breath sounds.  Abdominal:     General: Bowel sounds are normal. There is no distension.     Palpations: Abdomen is soft.  Musculoskeletal:     Cervical back: Normal range of motion and neck supple.     Right lower leg: No edema.     Left lower leg: No edema.  Lymphadenopathy:     Cervical: No cervical adenopathy.  Skin:    General: Skin is warm and dry.  Neurological:     General: No focal deficit present.     Mental Status: He is alert and oriented to person, place, and  time.     Cranial Nerves: No cranial nerve deficit.  Psychiatric:        Mood and Affect: Mood normal.        Behavior: Behavior normal.           Assessment & Plan:

## 2022-04-20 ENCOUNTER — Other Ambulatory Visit (INDEPENDENT_AMBULATORY_CARE_PROVIDER_SITE_OTHER): Payer: Medicare Other

## 2022-04-20 ENCOUNTER — Other Ambulatory Visit: Payer: Self-pay

## 2022-04-20 ENCOUNTER — Telehealth: Payer: Self-pay

## 2022-04-20 ENCOUNTER — Other Ambulatory Visit (HOSPITAL_BASED_OUTPATIENT_CLINIC_OR_DEPARTMENT_OTHER): Payer: Self-pay

## 2022-04-20 DIAGNOSIS — R7989 Other specified abnormal findings of blood chemistry: Secondary | ICD-10-CM | POA: Diagnosis not present

## 2022-04-20 DIAGNOSIS — E119 Type 2 diabetes mellitus without complications: Secondary | ICD-10-CM

## 2022-04-20 DIAGNOSIS — M2550 Pain in unspecified joint: Secondary | ICD-10-CM

## 2022-04-20 LAB — T4, FREE: Free T4: 1.2 ng/dL (ref 0.60–1.60)

## 2022-04-20 LAB — T3, FREE: T3, Free: 3.7 pg/mL (ref 2.3–4.2)

## 2022-04-20 MED ORDER — HYDROCODONE-ACETAMINOPHEN 5-325 MG PO TABS
1.0000 | ORAL_TABLET | Freq: Two times a day (BID) | ORAL | 0 refills | Status: DC | PRN
  Filled 2022-04-20: qty 60, 30d supply, fill #0

## 2022-04-20 MED ORDER — METOPROLOL TARTRATE 75 MG PO TABS
75.0000 mg | ORAL_TABLET | Freq: Two times a day (BID) | ORAL | 0 refills | Status: DC
  Filled 2022-04-20: qty 20, 10d supply, fill #0
  Filled 2022-04-21: qty 40, 20d supply, fill #0

## 2022-04-20 MED ORDER — METFORMIN HCL 500 MG PO TABS
ORAL_TABLET | ORAL | 0 refills | Status: DC
  Filled 2022-04-20: qty 180, fill #0
  Filled 2022-06-22: qty 180, 90d supply, fill #0

## 2022-04-20 NOTE — Telephone Encounter (Signed)
-----   Message from Midge Minium, MD sent at 04/20/2022  1:56 PM EDT ----- Your free T3 and free T4 are both within normal range.  But given that your TSH level is almost zero, I feel this needs to be evaluated by Endocrinology (dx abnormal TSH).  We will place the referral and someone should call you to schedule

## 2022-04-20 NOTE — Telephone Encounter (Signed)
Referral placed for endo

## 2022-04-20 NOTE — Telephone Encounter (Signed)
Patient is requesting a refill of the following medications: Requested Prescriptions   Pending Prescriptions Disp Refills   HYDROcodone-acetaminophen (NORCO/VICODIN) 5-325 MG tablet 60 tablet 0    Sig: Take 1 tablet by mouth 2 (two) times daily as needed for moderate pain.   Signed Prescriptions Disp Refills   Metoprolol Tartrate 75 MG TABS 60 tablet 0    Sig: Take 75 mg by mouth 2 (two) times daily.    Authorizing Provider: Midge Minium    Ordering User: Roswell Nickel K   metFORMIN (GLUCOPHAGE) 500 MG tablet 180 tablet 0    Sig: TAKE 1 TABLET (500 MG) BY MOUTH TWICE DAILY WITH A MEAL.    Authorizing Provider: Midge Minium    Ordering User: Patrcia Dolly    Date of patient request: 04/20/22 Last office visit: 04/19/22 Date of last refill: 03/22/22 Last refill amount: 60

## 2022-04-20 NOTE — Telephone Encounter (Signed)
Pt called back and informed.

## 2022-04-21 ENCOUNTER — Other Ambulatory Visit (HOSPITAL_BASED_OUTPATIENT_CLINIC_OR_DEPARTMENT_OTHER): Payer: Self-pay

## 2022-04-23 NOTE — Assessment & Plan Note (Signed)
Unclear if this is related to ongoing depression or there is another cause.  Encouraged him to try OTC Melatonin and if that is not effective, OTC Unisom.  Pt expressed understanding and is in agreement w/ plan.

## 2022-04-23 NOTE — Assessment & Plan Note (Signed)
Chronic problem, currently on Metformin '500mg'$  BID.  UTD on eye exam, foot exam done today.  Microalbumin ordered.  Currently asymptomatic.  Check labs.  Adjust meds prn

## 2022-04-23 NOTE — Assessment & Plan Note (Signed)
Chronic problem, on Lipitor '80mg'$  daily and Zetia '10mg'$  daily w/o difficulty.

## 2022-04-23 NOTE — Assessment & Plan Note (Signed)
Ongoing issue for pt.  Reports he will take extra 1/2 tab of Requip in the afternoon for breakthrough sxs.  Prescription updated to reflect change.

## 2022-04-23 NOTE — Assessment & Plan Note (Signed)
Chronic problem.  Currently well controlled on Metoprolol '75mg'$  BID and Lasix '40mg'$  daily.  Asymptomatic.  Check labs due to diuretic use but no anticipated med changes.

## 2022-04-23 NOTE — Assessment & Plan Note (Signed)
Chronic problem.  Unclear if this is the underlying cause of his insomnia.  Currently on Wellbutrin '450mg'$  daily, Lexapro '20mg'$  daily, Seroquel '50mg'$  daily.  States mood has been better but sleep has not.  Encouraged him to try Melatonin and if that doesn't work, OTC Unisom.

## 2022-04-27 ENCOUNTER — Other Ambulatory Visit: Payer: Self-pay | Admitting: Registered Nurse

## 2022-04-28 ENCOUNTER — Other Ambulatory Visit (HOSPITAL_BASED_OUTPATIENT_CLINIC_OR_DEPARTMENT_OTHER): Payer: Self-pay

## 2022-04-28 MED ORDER — SILDENAFIL CITRATE 100 MG PO TABS
ORAL_TABLET | ORAL | 0 refills | Status: DC
  Filled 2022-04-28: qty 8, 8d supply, fill #0

## 2022-05-04 ENCOUNTER — Other Ambulatory Visit: Payer: Self-pay | Admitting: Cardiology

## 2022-05-04 ENCOUNTER — Other Ambulatory Visit: Payer: Self-pay | Admitting: Registered Nurse

## 2022-05-04 DIAGNOSIS — M2550 Pain in unspecified joint: Secondary | ICD-10-CM

## 2022-05-05 ENCOUNTER — Other Ambulatory Visit (HOSPITAL_BASED_OUTPATIENT_CLINIC_OR_DEPARTMENT_OTHER): Payer: Self-pay

## 2022-05-05 ENCOUNTER — Encounter (HOSPITAL_BASED_OUTPATIENT_CLINIC_OR_DEPARTMENT_OTHER): Payer: Self-pay | Admitting: Pharmacist

## 2022-05-05 MED ORDER — POTASSIUM CHLORIDE CRYS ER 20 MEQ PO TBCR
20.0000 meq | EXTENDED_RELEASE_TABLET | Freq: Every day | ORAL | 3 refills | Status: DC
  Filled 2022-05-05: qty 90, 90d supply, fill #0
  Filled 2022-07-28: qty 90, 90d supply, fill #1
  Filled 2022-10-26: qty 90, 90d supply, fill #2
  Filled 2023-01-24: qty 90, 90d supply, fill #3

## 2022-05-20 ENCOUNTER — Other Ambulatory Visit (HOSPITAL_BASED_OUTPATIENT_CLINIC_OR_DEPARTMENT_OTHER): Payer: Self-pay

## 2022-05-20 ENCOUNTER — Other Ambulatory Visit (HOSPITAL_BASED_OUTPATIENT_CLINIC_OR_DEPARTMENT_OTHER): Payer: Self-pay | Admitting: Family

## 2022-05-20 DIAGNOSIS — Z01810 Encounter for preprocedural cardiovascular examination: Secondary | ICD-10-CM

## 2022-05-20 DIAGNOSIS — E785 Hyperlipidemia, unspecified: Secondary | ICD-10-CM

## 2022-05-20 DIAGNOSIS — I5032 Chronic diastolic (congestive) heart failure: Secondary | ICD-10-CM

## 2022-05-20 MED ORDER — FUROSEMIDE 40 MG PO TABS
40.0000 mg | ORAL_TABLET | Freq: Every day | ORAL | 2 refills | Status: DC
  Filled 2022-05-20: qty 90, 90d supply, fill #0
  Filled 2022-08-15: qty 90, 90d supply, fill #1
  Filled 2022-11-12 (×2): qty 90, 90d supply, fill #2

## 2022-05-22 ENCOUNTER — Other Ambulatory Visit: Payer: Self-pay | Admitting: Family Medicine

## 2022-05-22 DIAGNOSIS — M2550 Pain in unspecified joint: Secondary | ICD-10-CM

## 2022-05-23 ENCOUNTER — Other Ambulatory Visit (HOSPITAL_BASED_OUTPATIENT_CLINIC_OR_DEPARTMENT_OTHER): Payer: Self-pay

## 2022-05-23 NOTE — Telephone Encounter (Signed)
Patient is requesting a refill of the following medications: Requested Prescriptions   Pending Prescriptions Disp Refills   HYDROcodone-acetaminophen (NORCO/VICODIN) 5-325 MG tablet 60 tablet 0    Sig: Take 1 tablet by mouth 2 (two) times daily as needed for moderate pain.    Date of patient request: 05/23/22 Last office visit: 04/19/22 Date of last refill: 04/20/22 Last refill amount: 60

## 2022-05-24 ENCOUNTER — Other Ambulatory Visit (HOSPITAL_BASED_OUTPATIENT_CLINIC_OR_DEPARTMENT_OTHER): Payer: Self-pay

## 2022-05-24 MED ORDER — HYDROCODONE-ACETAMINOPHEN 5-325 MG PO TABS
1.0000 | ORAL_TABLET | Freq: Two times a day (BID) | ORAL | 0 refills | Status: DC | PRN
  Filled 2022-05-24: qty 60, 30d supply, fill #0

## 2022-05-26 ENCOUNTER — Other Ambulatory Visit: Payer: Self-pay | Admitting: Family Medicine

## 2022-05-27 ENCOUNTER — Other Ambulatory Visit (HOSPITAL_BASED_OUTPATIENT_CLINIC_OR_DEPARTMENT_OTHER): Payer: Self-pay

## 2022-05-27 MED ORDER — SILDENAFIL CITRATE 100 MG PO TABS
ORAL_TABLET | ORAL | 0 refills | Status: DC
  Filled 2022-05-27: qty 8, 30d supply, fill #0

## 2022-05-31 ENCOUNTER — Encounter (HOSPITAL_BASED_OUTPATIENT_CLINIC_OR_DEPARTMENT_OTHER): Payer: Self-pay | Admitting: Nurse Practitioner

## 2022-05-31 ENCOUNTER — Ambulatory Visit (INDEPENDENT_AMBULATORY_CARE_PROVIDER_SITE_OTHER): Payer: Medicare Other | Admitting: Nurse Practitioner

## 2022-05-31 ENCOUNTER — Other Ambulatory Visit (HOSPITAL_BASED_OUTPATIENT_CLINIC_OR_DEPARTMENT_OTHER): Payer: Self-pay

## 2022-05-31 VITALS — BP 108/76 | HR 82 | Ht 68.0 in | Wt 221.0 lb

## 2022-05-31 DIAGNOSIS — G4700 Insomnia, unspecified: Secondary | ICD-10-CM

## 2022-05-31 DIAGNOSIS — Z23 Encounter for immunization: Secondary | ICD-10-CM

## 2022-05-31 DIAGNOSIS — I482 Chronic atrial fibrillation, unspecified: Secondary | ICD-10-CM

## 2022-05-31 DIAGNOSIS — J439 Emphysema, unspecified: Secondary | ICD-10-CM

## 2022-05-31 DIAGNOSIS — E119 Type 2 diabetes mellitus without complications: Secondary | ICD-10-CM

## 2022-05-31 DIAGNOSIS — E079 Disorder of thyroid, unspecified: Secondary | ICD-10-CM

## 2022-05-31 DIAGNOSIS — I5032 Chronic diastolic (congestive) heart failure: Secondary | ICD-10-CM

## 2022-05-31 DIAGNOSIS — I10 Essential (primary) hypertension: Secondary | ICD-10-CM

## 2022-05-31 DIAGNOSIS — Z0000 Encounter for general adult medical examination without abnormal findings: Secondary | ICD-10-CM

## 2022-05-31 DIAGNOSIS — F321 Major depressive disorder, single episode, moderate: Secondary | ICD-10-CM

## 2022-05-31 MED ORDER — HYDROXYZINE HCL 50 MG PO TABS
50.0000 mg | ORAL_TABLET | Freq: Every evening | ORAL | 3 refills | Status: DC | PRN
  Filled 2022-05-31: qty 10, 5d supply, fill #0
  Filled 2022-05-31: qty 50, 25d supply, fill #0

## 2022-05-31 NOTE — Assessment & Plan Note (Signed)
Chronic.  No alarm symptoms present at this time.  Lungs clear to auscultation.  No changes in plan of care.

## 2022-05-31 NOTE — Progress Notes (Signed)
Shaun Render, DNP, AGNP-c [facility] Medicine [address]  [place], [place] [address] [phone]  New patient visit   Patient: Shaun [name]   DOB: 1956/01/14   66 y.o. Male  MRN: [id number] Visit Date: 05/31/2022  Patient Care Team: Alaisha Eversley, Shaun Pesa, NP as PCP - General (Nurse Practitioner) Lelon Perla, MD as PCP - Cardiology (Cardiology) Danie Binder, MD (Inactive) as Consulting Physician (Gastroenterology) Lelon Perla, MD as Consulting Physician (Cardiology) [facility] (Specialist)  Today's Vitals   05/31/22 1315  BP: 108/76  Pulse: 82  SpO2: 97%  Weight: 221 lb (100.2 kg)  Height: '5\' 8"'$  (1.727 m)   Body mass index is 33.6 kg/m.   Today's healthcare provider: Orma Render, NP   Chief Complaint  Patient presents with   New Patient (Initial Visit)    Patient presents today to establish care. Patient would like flu shot while in the office. Patient concern regarding taking hydrocodone for chronic pain needs to continue that script.    Subjective    Shaun [name] is a 66 y.o. male who presents today as a new patient to establish care.    Patient endorses the following concerns presently: Nighttime awakenings - wakes up multiple times a night - on cpap - resltess legs - trazodone worked well for him in the past then stopped - started seroquel but hasn't seemed to work  DJD- Back pain, hip pain, shoulder pain - Chronic pain medication for control - medication helps to keep it down to a functional level - he would like to continue this medication  History reviewed and reveals the following: Past Medical History:  Diagnosis Date   Anemia    Anxiety    Asthma    CAD (coronary artery disease)    DES to distal circumflex 2016   Cataract    Colon polyps    30 colon polyps found on first colonoscopy   Diastolic heart failure (HCC)    Diverticulitis    DJD (degenerative joint disease)     GERD (gastroesophageal reflux disease)    History of kidney stones    Hyperlipidemia    Hypertension    Insomnia    Obstructive sleep apnea 12/2009   01/26/2010 AHI 83/hr   Permanent atrial fibrillation ([place])    Onset 2006 paroxysmal then progressive to persistent   PUD (peptic ulcer disease)    1980s   RLS (restless legs syndrome)    Sinusitis    Skin cancer    Type 2 diabetes mellitus ([place])    Past Surgical History:  Procedure Laterality Date   BIOPSY  07/17/2018   Procedure: BIOPSY;  Surgeon: Danie Binder, MD;  [facility];  Service: Endoscopy;;  colon   BOWEL RESECTION  09/17/2018   SMALL BOWEL RESECTION: 71 CM    CARDIAC CATHETERIZATION N/A 07/21/2015   Procedure: Left Heart Cath and Coronary Angiography;  Surgeon: Peter M Martinique, MD;  Location: [facility];  Service: Cardiovascular;  Laterality: N/A;   CARDIAC CATHETERIZATION N/A 07/21/2015   Procedure: Coronary Stent Intervention;  Surgeon: Peter M Martinique, MD;  Location: [facility];  Service: Cardiovascular;  Laterality: N/A;   CIRCUMCISION N/A 04/05/2019   Procedure: CIRCUMCISION ADULT;  Surgeon: Irine Seal, MD;  Location: [facility];  Service: Urology;  Laterality: N/A;   COLONOSCOPY N/A 05/19/2014   Dr. Barnie Alderman diverticulosis/moderate external hemorrhoids, >20 simple adenomas. Genetic screening negative.  COLONOSCOPY WITH PROPOFOL N/A 07/17/2018   Dr. Oneida Alar: Diverticulosis, external/internal hemorrhoids, 32 colon polyps removed.  ten tubular adenomas removed with no high-grade dysplasia.  Advised to have surveillance colonoscopy in 3 years.   COLONOSCOPY WITH PROPOFOL N/A 06/07/2021   Procedure: COLONOSCOPY WITH PROPOFOL;  Surgeon: Eloise Harman, DO;  [facility];  Service: Endoscopy;  Laterality: N/A;  9:30 / ASA 3  (Pt was told that his time will be given at Pre-op)   ESOPHAGOGASTRODUODENOSCOPY (EGD) WITH PROPOFOL N/A 07/17/2018   Dr. Oneida Alar: Low-grade narrowing Schatzki ring  at the GE junction status post dilation.  Gastritis.  Biopsy with mild nonspecific reactive gastropathy.  No H. pylori.   GIVENS CAPSULE STUDY N/A 06/24/2019   normal   HERNIA REPAIR  1986   Left inguinal   INTRAVASCULAR PRESSURE WIRE/FFR STUDY Left 06/08/2017   Procedure: INTRAVASCULAR PRESSURE WIRE/FFR STUDY;  Surgeon: Nelva Bush, MD;  Location: [facility];  Service: Cardiovascular;  Laterality: Left;  LAD and CFX   LAPAROTOMY N/A 09/17/2018   Procedure: EXPLORATORY LAPAROTOMY;  Surgeon: Virl Cagey, MD;  Location: [facility];  Service: General;  Laterality: N/A;   LEFT HEART CATH AND CORONARY ANGIOGRAPHY N/A 06/08/2017   Procedure: LEFT HEART CATH AND CORONARY ANGIOGRAPHY;  Surgeon: Nelva Bush, MD;  Location: [facility];  Service: Cardiovascular;  Laterality: N/A;   POLYPECTOMY  07/17/2018   Procedure: POLYPECTOMY;  Surgeon: Danie Binder, MD;  [facility];  Service: Endoscopy;;  colon   POLYPECTOMY  06/07/2021   Procedure: POLYPECTOMY INTESTINAL;  Surgeon: Eloise Harman, DO;  [facility];  Service: Endoscopy;;   ROTATOR CUFF REPAIR     Right   SAVORY DILATION N/A 07/17/2018   Procedure: SAVORY DILATION;  Surgeon: Danie Binder, MD;  [facility];  Service: Endoscopy;  Laterality: N/A;   Family Status  Relation Name Status   Mother  Deceased at age 61   Father Bradly Bienenstock Deceased at age 27   Sister  [place]  Deceased   [place]  Deceased   MGM  Deceased   MGF  Deceased   [place]  Deceased   PGF  Deceased   [place]  Deceased   Neg Hx  (Not Specified)   Family History  Problem Relation Age of Onset   Hypertension Mother    Breast cancer Mother 55       brain/bone    Heart attack Father    Skin cancer Sister 52   Diabetes Brother    Parkinson's disease Brother    Brain cancer Maternal Uncle    Cancer Maternal Uncle        NOS   Breast cancer Cousin         maternal cousin dx <50   Cancer Cousin    Colon cancer Neg Hx    Social History   Socioeconomic History   Marital status: Married    Spouse name: [name] Weems   Number of children: 5   Years of education: 12   Highest education level: 12th grade  Occupational History   Occupation: employed    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  Tobacco Use   Smoking status: Former    Packs/day: 0.50    Years: 39.00    Total pack years: 19.50    Types: Cigarettes    Start date: 07/24/1970  Quit date: 08/06/2021    Years since quitting: 0.8    Passive exposure: Past   Smokeless tobacco: Never  Vaping Use   Vaping Use: Never used  Substance and Sexual Activity   Alcohol use: No    Alcohol/week: 0.0 standard drinks of alcohol   Drug use: No   Sexual activity: Yes    Partners: Female  Other Topics Concern   Not on file  Social History Narrative   Not on file   Social Determinants of Health   Financial Resource Strain: Low Risk  (02/02/2022)   Overall Financial Resource Strain (CARDIA)    Difficulty of Paying Living Expenses: Not hard at all  Food Insecurity: No Food Insecurity (02/02/2022)   Hunger Vital Sign    Worried About Running Out of Food in the Last Year: Never true    Ran Out of Food in the Last Year: Never true  Transportation Needs: No Transportation Needs (02/02/2022)   PRAPARE - Hydrologist (Medical): No    Lack of Transportation (Non-Medical): No  Physical Activity: Sufficiently Active (02/02/2022)   Exercise Vital Sign    Days of Exercise per Week: 5 days    Minutes of Exercise per Session: 40 min  Recent Concern: Physical Activity - Inactive (11/04/2021)   Exercise Vital Sign    Days of Exercise per Week: 0 days    Minutes of Exercise per Session: 0 min  Stress: No Stress Concern Present (02/02/2022)   [place]    Feeling of Stress : Not at all  Social Connections:  Moderately Isolated (02/02/2022)   Social Connection and Isolation Panel [NHANES]    Frequency of Communication with Friends and Family: Three times a week    Frequency of Social Gatherings with Friends and Family: Three times a week    Attends Religious Services: Never    Active Member of Clubs or Organizations: No    Attends Archivist Meetings: Never    Marital Status: Married   Outpatient Medications Prior to Visit  Medication Sig   acetaminophen (TYLENOL) 325 MG tablet Take 2 tablets (650 mg total) by mouth every 6 (six) hours as needed for mild pain.   albuterol (VENTOLIN HFA) 108 (90 Base) MCG/ACT inhaler INHALE 2 PUFFS INTO THE LUNGS EVERY 6 HOURS AS NEEDED FOR WHEEZING OR SHORTNESS OF BREATH   apixaban (ELIQUIS) 5 MG TABS tablet TAKE 1 TABLET (5 MG) BY MOUTH TWICE DAILY   atorvastatin (LIPITOR) 80 MG tablet Take 1 tablet (80 mg total) by mouth daily. TAKE 1 TABLET(80 MG) BY MOUTH EVERY EVENING   azelastine (ASTELIN) 0.1 % nasal spray Place 1 spray into both nostrils 2 (two) times daily. Use in each nostril as directed   buPROPion (WELLBUTRIN XL) 150 MG 24 hr tablet Take 3 tablets once daily   docusate sodium (COLACE) 100 MG capsule Take 100 mg by mouth daily as needed for mild constipation.   escitalopram (LEXAPRO) 20 MG tablet TAKE 1 TABLET(20 MG) BY MOUTH DAILY   ezetimibe (ZETIA) 10 MG tablet TAKE 1 TABLET BY MOUTH EVERY DAY   ferrous sulfate 325 (65 FE) MG tablet Take 325 mg by mouth daily with breakfast.   furosemide (LASIX) 40 MG tablet Take 1 tablet (40 mg total) by mouth daily.   HYDROcodone-acetaminophen (NORCO/VICODIN) 5-325 MG tablet Take 1 tablet by mouth 2 (two) times daily as needed for moderate pain.   magnesium gluconate (MAGONATE) 500  MG tablet Take 0.5 tablets (250 mg total) by mouth at bedtime.   metFORMIN (GLUCOPHAGE) 500 MG tablet TAKE 1 TABLET (500 MG) BY MOUTH TWICE DAILY WITH A MEAL.   Metoprolol Tartrate 75 MG TABS Take 75 mg by mouth 2 (two) times  daily.   nitroGLYCERIN (NITROSTAT) 0.4 MG SL tablet Place 1 tablet (0.4 mg total) under the tongue every 5 (five) minutes as needed.   omeprazole (PRILOSEC) 40 MG capsule Take 1 capsule by mouth once daily 30 minutes before breakfast   potassium chloride SA (KLOR-CON M) 20 MEQ tablet Take 1 tablet (20 mEq total) by mouth daily.   rOPINIRole (REQUIP) 3 MG tablet Take 1 tab nightly for RLS and a 1/2 tab during the day to control breakthrough symptoms   sildenafil (VIAGRA) 100 MG tablet TAKE 1 TABLET BY MOUTH 30 MINUTES BEFORE ACTIVITY   sucralfate (CARAFATE) 1 g tablet Take one tablet po BID PRN (Patient taking differently: Take 1 g by mouth 2 (two) times daily as needed (acid reflux).)   tiZANidine (ZANAFLEX) 4 MG tablet Take 1 tablet (4 mg total) by mouth 2 (two) times daily as needed for muscle spasms.   triamcinolone ointment (KENALOG) 0.1 % Apply 1 application. topically 2 (two) times daily.   [DISCONTINUED] hydrOXYzine (ATARAX) 10 MG tablet Take 1 tablet (10 mg total) by mouth 2 (two) times daily as needed for itching.   [DISCONTINUED] QUEtiapine (SEROQUEL) 50 MG tablet Take 1 tablet (50 mg total) by mouth at bedtime.   No facility-administered medications prior to visit.   No Known Allergies Immunization History  Administered Date(s) Administered   Fluad Quad(high Dose 65+) 08/08/2021   Influenza Whole 05/14/2010   Influenza, Quadrivalent, Recombinant, Inj, Pf 05/31/2022   Influenza,inj,Quad PF,6+ Mos 06/30/2014, 06/18/2015, 05/17/2016, 05/11/2017, 06/21/2018, 07/01/2019, 05/26/2020, 04/19/2022   Influenza,inj,quad, With Preservative 05/29/2017   Influenza-Unspecified 05/30/2011   PFIZER(Purple Top)SARS-COV-2 Vaccination 11/11/2019, 12/12/2019, 07/30/2020   Pneumococcal Polysaccharide-23 08/29/2010, 08/08/2021   Td 02/24/2014   Zoster Recombinat (Shingrix) 03/30/2018    Review of Systems All review of systems negative except what is listed in the HPI   Objective    BP 108/76    Pulse 82   Ht '5\' 8"'$  (1.727 m)   Wt 221 lb (100.2 kg)   SpO2 97%   BMI 33.60 kg/m  Physical Exam Vitals and nursing note reviewed.  Constitutional:      Appearance: Normal appearance.  HENT:     Head: Normocephalic.  Eyes:     Extraocular Movements: Extraocular movements intact.     Conjunctiva/sclera: Conjunctivae normal.     Pupils: Pupils are equal, round, and reactive to light.  Neck:     Vascular: No carotid bruit.  Cardiovascular:     Rate and Rhythm: Normal rate. Rhythm irregular.     Pulses: Normal pulses.     Heart sounds: Normal heart sounds. No murmur heard. Pulmonary:     Effort: Pulmonary effort is normal.     Breath sounds: Normal breath sounds. No wheezing.  Abdominal:     General: Abdomen is flat. Bowel sounds are normal. There is no distension.     Palpations: Abdomen is soft.     Tenderness: There is no abdominal tenderness. There is no guarding.  Musculoskeletal:        General: Tenderness present. Normal range of motion.     Cervical back: Normal range of motion.     Right lower leg: No edema.     Left lower leg:  No edema.  Lymphadenopathy:     Cervical: No cervical adenopathy.  Skin:    General: Skin is warm and dry.     Capillary Refill: Capillary refill takes less than 2 seconds.  Neurological:     General: No focal deficit present.     Mental Status: He is alert and oriented to person, place, and time.     Motor: Weakness present.     Gait: Gait abnormal.  Psychiatric:        Mood and Affect: Mood normal.        Behavior: Behavior normal.        Thought Content: Thought content normal.        Judgment: Judgment normal.     No results found for any visits on 05/31/22.  Assessment & Plan      Problem List Items Addressed This Visit     Chronic atrial fibrillation ([place])    Present on examination today.  Currently on anticoagulation.  No alarm symptoms at this time.  Continue current medication.      Essential hypertension    Chronic.   Well-controlled at this time.  No alarm symptoms present.  Continue current medication regimen and monitoring.  Labs recently done and reviewed today.  Follow-up in 3 months.      Non-insulin treated type 2 diabetes mellitus (HCC)    Chronic.  Currently managed with metformin.  Recent labs reviewed.  A1c slightly elevated.  Encouraged continuation of diet and exercise as well as medication.  Follow-up in 3 months      COPD (chronic obstructive pulmonary disease) (HCC)    Chronic.  No alarm symptoms present at this time.  Lungs clear to auscultation.  No changes in plan of care.      Insomnia    Not well controlled at this time with quetiapine.  Discussion with patient on alternatives.  Would like to avoid over sedating medication due to other medications he is taking.  We will plan to trial hydroxyzine to see if this is helpful.  He will follow-up if he does not notice any relief in the next 2 weeks.      Relevant Medications   hydrOXYzine (ATARAX) 50 MG tablet   Chronic diastolic CHF (congestive heart failure) (HCC)    Chronic.  Currently no symptoms present.  Tolerating diuretic well with no side effects.  Recent labs reviewed.  Continue current medication regimen follow-up in 3 months.      Current moderate episode of major depressive disorder without prior episode (HCC)    Chronic.  Currently well controlled on bupropion and escitalopram.  No alarm symptoms present at this time.  Continue current management.  Follow-up in 3 months      Relevant Medications   hydrOXYzine (ATARAX) 50 MG tablet   Thyroid dysfunction    Recent thyroid labs showed evidence of hyperthyroidism.  We will plan to repeat labs today.  Changes to plan of care based on findings.      Relevant Orders   TSH   T4, free   Other Visit Diagnoses     Encounter for medical examination to establish care    -  Primary   Flu vaccine need       Relevant Orders   Flu vaccine, recombinat, quadrivalent, inj  (Completed)        Return in about 3 months (around 08/31/2022).      Navaya Wiatrek, Shaun Pesa, NP, DNP, AGNP-C Primary Care [facility] Medicine at  [facility]   Health Maintenance Due Health Maintenance Topics with due status: Due Soon     Topic Date Due   Pneumonia Vaccine 5+ Years old 08/08/2022    CPE Due  Labs Due

## 2022-05-31 NOTE — Patient Instructions (Signed)
Thank you for choosing [place] at [facility] for your Primary Care needs. I am excited for the opportunity to partner with you to meet your health care goals. It was a pleasure meeting you today!  Recommendations from today's visit: Stop seroquel and we will start hydroxyzine to see how this helps you with your sleep. If you don't have any luck with this after about 2 weeks, please let me know.  Information on diet, exercise, and health maintenance recommendations are listed below. This is information to help you be sure you are on track for optimal health and monitoring.   Please look over this and let us know if you have any questions or if you have completed any of the health maintenance outside of [place] so that we can be sure your records are up to date.  ___________________________________________________________ About Me: I am an Adult-Geriatric Nurse Practitioner with a background in caring for patients for more than 20 years with a strong intensive care background. I provide primary care and sports medicine services to patients age 67 and older within this office. My education had a strong focus on caring for the older adult population, which I am passionate about. I am also the director of the APP Fellowship with [facility].   My desire is to provide you with the best service through preventive medicine and supportive care. I consider you a part of the medical team and value your input. I work diligently to ensure that you are heard and your needs are met in a safe and effective manner. I want you to feel comfortable with me as your provider and want you to know that your health concerns are important to me.  For your information, our office hours are: Monday, Tuesday, and Thursday 8:00 AM - 5:00 PM Wednesday and Friday 8:00 AM - 12:00 PM.   In my time away from the office I am teaching new APP's within the system and am unavailable, but my partner, Dr.  Burnard Bunting is in the office for emergent needs.   If you have questions or concerns, please call our office at [phone] or send [place] a MyChart message and we will respond as quickly as possible.  ____________________________________________________________ MyChart:  For all urgent or time sensitive needs we ask that you please call the office to avoid delays. Our number is [phone]. MyChart is not constantly monitored and due to the large volume of messages a day, replies may take up to 72 business hours.  MyChart Policy: MyChart allows for you to see your visit notes, after visit summary, provider recommendations, lab and tests results, make an appointment, request refills, and contact your provider or the office for non-urgent questions or concerns. Providers are seeing patients during normal business hours and do not have built in time to review MyChart messages.  We ask that you allow a minimum of 3 business days for responses to [employer]. For this reason, please do not send urgent requests through [place]. Please call the office at [phone]. New and ongoing conditions may require a visit. We have virtual and in person visit available for your convenience.  Complex MyChart concerns may require a visit. Your provider may request you schedule a virtual or in person visit to ensure we are providing the best care possible. MyChart messages sent after 11:00 AM on Friday will not be received by the provider until Monday morning.    Lab and Test Results: You will receive your lab  and test results on MyChart as soon as they are completed and results have been sent by the lab or testing facility. Due to this service, you will receive your results BEFORE your provider.  I review lab and tests results each morning prior to seeing patients. Some results require collaboration with other providers to ensure you are receiving the most appropriate care. For this reason, we ask that you please  allow a minimum of 3-5 business days from the time the ALL results have been received for your provider to receive and review lab and test results and contact you about these.  Most lab and test result comments from the provider will be sent through [place]. Your provider may recommend changes to the plan of care, follow-up visits, repeat testing, ask questions, or request an office visit to discuss these results. You may reply directly to this message or call the office at [phone] to provide information for the provider or set up an appointment. In some instances, you will be called with test results and recommendations. Please let us know if this is preferred and we will make note of this in your chart to provide this for you.    If you have not heard a response to your lab or test results in 5 business days from all results returning to [place], please call the office to let us know. We ask that you please avoid calling prior to this time unless there is an emergent concern. Due to high call volumes, this can delay the resulting process.  After Hours: For all non-emergency after hours needs, please call the office at [phone] and select the option to reach the on-call provider service. On-call services are shared between multiple [place] offices and therefore it will not be possible to speak directly with your provider. On-call providers may provide medical advice and recommendations, but are unable to provide refills for maintenance medications.  For all emergency or urgent medical needs after normal business hours, we recommend that you seek care at the closest Urgent Care or Emergency Department to ensure appropriate treatment in a timely manner.  [facility] [place] has a 24 hour emergency room located on the ground floor for your convenience.   Urgent Concerns During the Business Day Providers are seeing patients from 8AM to [place] with a busy schedule and are most often  not able to respond to non-urgent calls until the end of the day or the next business day. If you should have URGENT concerns during the day, please call and speak to the nurse or schedule a same day appointment so that we can address your concern without delay.   Thank you, again, for choosing me as your health care partner. I appreciate your trust and look forward to learning more about you.   Worthy Keeler, DNP, AGNP-c ___________________________________________________________  Health Maintenance Recommendations Screening Testing Mammogram Every 1 -2 years based on history and risk factors Starting at age 94 Pap Smear Ages 21-39 every 3 years Ages 47-65 every 5 years with HPV testing More frequent testing may be required based on results and history Colon Cancer Screening Every 1-10 years based on test performed, risk factors, and history Starting at age 50 Bone Density Screening Every 2-10 years based on history Starting at age 73 for women Recommendations for men differ based on medication usage, history, and risk factors AAA Screening One time ultrasound Men 48-18 years old who have every smoked Lung Cancer Screening Low Dose Lung CT  every 12 months Age 58-80 years with a 30 pack-year smoking history who still smoke or who have quit within the last 15 years  Screening Labs Routine  Labs: Complete Blood Count (CBC), Complete Metabolic Panel (CMP), Cholesterol (Lipid Panel) Every 6-12 months based on history and medications May be recommended more frequently based on current conditions or previous results Hemoglobin A1c Lab Every 3-12 months based on history and previous results Starting at age 83 or earlier with diagnosis of diabetes, high cholesterol, BMI >26, and/or risk factors Frequent monitoring for patients with diabetes to ensure blood sugar control Thyroid Panel (TSH w/ T3 & T4) Every 6 months based on history, symptoms, and risk factors May be repeated more  often if on medication HIV One time testing for all patients 100 and older May be repeated more frequently for patients with increased risk factors or exposure Hepatitis C One time testing for all patients 37 and older May be repeated more frequently for patients with increased risk factors or exposure Gonorrhea, Chlamydia Every 12 months for all sexually active persons 13-24 years Additional monitoring may be recommended for those who are considered high risk or who have symptoms PSA Men 53-7 years old with risk factors Additional screening may be recommended from age 28-69 based on risk factors, symptoms, and history  Vaccine Recommendations Tetanus Booster All adults every 10 years Flu Vaccine All patients 6 months and older every year COVID Vaccine All patients 12 years and older Initial dosing with booster May recommend additional booster based on age and health history HPV Vaccine 2 doses all patients age 16-26 Dosing may be considered for patients over 26 Shingles Vaccine (Shingrix) 2 doses all adults 64 years and older Pneumonia (Pneumovax 23) All adults 50 years and older May recommend earlier dosing based on health history Pneumonia (Prevnar 42) All adults 64 years and older Dosed 1 year after Pneumovax 23  Additional Screening, Testing, and Vaccinations may be recommended on an individualized basis based on family history, health history, risk factors, and/or exposure.  __________________________________________________________  Diet Recommendations for All Patients  I recommend that all patients maintain a diet low in saturated fats, carbohydrates, and cholesterol. While this can be challenging at first, it is not impossible and small changes can make big differences.  Things to try: Decreasing the amount of soda, sweet tea, and/or juice to one or less per day and replace with water While water is always the first choice, if you do not like water you may  consider adding a water additive without sugar to improve the taste other sugar free drinks Replace potatoes with a brightly colored vegetable at dinner Use healthy oils, such as canola oil or olive oil, instead of butter or hard margarine Limit your bread intake to two pieces or less a day Replace regular pasta with low carb pasta options Bake, broil, or grill foods instead of frying Monitor portion sizes  Eat smaller, more frequent meals throughout the day instead of large meals  An important thing to remember is, if you love foods that are not great for your health, you don't have to give them up completely. Instead, allow these foods to be a reward when you have done well. Allowing yourself to still have special treats every once in a while is a nice way to tell yourself thank you for working hard to keep yourself healthy.   Also remember that every day is a new day. If you have a bad day and "fall off  the wagon", you can still climb right back up and keep moving along on your journey!  We have resources available to help you!  Some websites that may be helpful include: www.http://carter.biz/  Www.VeryWellFit.com _____________________________________________________________  Activity Recommendations for All Patients  I recommend that all adults get at least 20 minutes of moderate physical activity that elevates your heart rate at least 5 days out of the week.  Some examples include: Walking or jogging at a pace that allows you to carry on a conversation Cycling (stationary bike or outdoors) Water aerobics Yoga Weight lifting Dancing If physical limitations prevent you from putting stress on your joints, exercise in a pool or seated in a chair are excellent options.  Do determine your MAXIMUM heart rate for activity: YOUR AGE - 220 = MAX HeartRate   Remember! Do not push yourself too hard.  Start slowly and build up your pace, speed, weight, time in exercise, etc.  Allow your body  to rest between exercise and get good sleep. You will need more water than normal when you are exerting yourself. Do not wait until you are thirsty to drink. Drink with a purpose of getting in at least 8, 8 ounce glasses of water a day plus more depending on how much you exercise and sweat.    If you begin to develop dizziness, chest pain, abdominal pain, jaw pain, shortness of breath, headache, vision changes, lightheadedness, or other concerning symptoms, stop the activity and allow your body to rest. If your symptoms are severe, seek emergency evaluation immediately. If your symptoms are concerning, but not severe, please let us know so that we can recommend further evaluation.

## 2022-05-31 NOTE — Assessment & Plan Note (Signed)
Recent thyroid labs showed evidence of hyperthyroidism.  We will plan to repeat labs today.  Changes to plan of care based on findings.

## 2022-05-31 NOTE — Assessment & Plan Note (Signed)
Present on examination today.  Currently on anticoagulation.  No alarm symptoms at this time.  Continue current medication.

## 2022-05-31 NOTE — Assessment & Plan Note (Signed)
Chronic.  Currently well controlled on bupropion and escitalopram.  No alarm symptoms present at this time.  Continue current management.  Follow-up in 3 months

## 2022-05-31 NOTE — Assessment & Plan Note (Signed)
Chronic.  Currently no symptoms present.  Tolerating diuretic well with no side effects.  Recent labs reviewed.  Continue current medication regimen follow-up in 3 months.

## 2022-05-31 NOTE — Assessment & Plan Note (Signed)
Not well controlled at this time with quetiapine.  Discussion with patient on alternatives.  Would like to avoid over sedating medication due to other medications he is taking.  We will plan to trial hydroxyzine to see if this is helpful.  He will follow-up if he does not notice any relief in the next 2 weeks.

## 2022-05-31 NOTE — Assessment & Plan Note (Signed)
Chronic.  Currently managed with metformin.  Recent labs reviewed.  A1c slightly elevated.  Encouraged continuation of diet and exercise as well as medication.  Follow-up in 3 months

## 2022-05-31 NOTE — Assessment & Plan Note (Signed)
Chronic.  Well-controlled at this time.  No alarm symptoms present.  Continue current medication regimen and monitoring.  Labs recently done and reviewed today.  Follow-up in 3 months.

## 2022-06-01 LAB — TSH: TSH: 0.033 u[IU]/mL — ABNORMAL LOW (ref 0.450–4.500)

## 2022-06-01 LAB — T4, FREE: Free T4: 1.6 ng/dL (ref 0.82–1.77)

## 2022-06-08 ENCOUNTER — Encounter (HOSPITAL_BASED_OUTPATIENT_CLINIC_OR_DEPARTMENT_OTHER): Payer: Self-pay | Admitting: Nurse Practitioner

## 2022-06-08 DIAGNOSIS — G4700 Insomnia, unspecified: Secondary | ICD-10-CM

## 2022-06-08 DIAGNOSIS — I5032 Chronic diastolic (congestive) heart failure: Secondary | ICD-10-CM

## 2022-06-15 ENCOUNTER — Telehealth (HOSPITAL_BASED_OUTPATIENT_CLINIC_OR_DEPARTMENT_OTHER): Payer: Self-pay

## 2022-06-15 NOTE — Telephone Encounter (Signed)
Patient called upset that no one has responded to his mychart message. Please advise if I can help.

## 2022-06-22 ENCOUNTER — Other Ambulatory Visit: Payer: Self-pay | Admitting: Registered Nurse

## 2022-06-22 ENCOUNTER — Other Ambulatory Visit: Payer: Self-pay | Admitting: Lab

## 2022-06-22 ENCOUNTER — Other Ambulatory Visit: Payer: Self-pay | Admitting: Family Medicine

## 2022-06-22 ENCOUNTER — Other Ambulatory Visit: Payer: Self-pay | Admitting: Nurse Practitioner

## 2022-06-22 ENCOUNTER — Other Ambulatory Visit (HOSPITAL_BASED_OUTPATIENT_CLINIC_OR_DEPARTMENT_OTHER): Payer: Self-pay

## 2022-06-22 DIAGNOSIS — M2550 Pain in unspecified joint: Secondary | ICD-10-CM

## 2022-06-22 MED ORDER — METOPROLOL TARTRATE 75 MG PO TABS
75.0000 mg | ORAL_TABLET | Freq: Two times a day (BID) | ORAL | 0 refills | Status: DC
  Filled 2022-06-22: qty 60, 30d supply, fill #0

## 2022-06-22 MED ORDER — TIZANIDINE HCL 4 MG PO TABS
4.0000 mg | ORAL_TABLET | Freq: Two times a day (BID) | ORAL | 0 refills | Status: DC | PRN
  Filled 2022-06-22: qty 60, 30d supply, fill #0

## 2022-06-22 MED ORDER — ROPINIROLE HCL 3 MG PO TABS
ORAL_TABLET | ORAL | 0 refills | Status: DC
  Filled 2022-06-22: qty 45, 30d supply, fill #0

## 2022-06-22 NOTE — Telephone Encounter (Signed)
Patient is requesting a refill of the following medications: Requested Prescriptions   Pending Prescriptions Disp Refills   HYDROcodone-acetaminophen (NORCO/VICODIN) 5-325 MG tablet 60 tablet 0    Sig: Take 1 tablet by mouth 2 (two) times daily as needed for moderate pain.    Date of patient request: 06/22/2025 Last office visit: 04/19/2022 Date of last refill: 05/24/2022 Last refill amount: 60 tab

## 2022-06-23 ENCOUNTER — Other Ambulatory Visit (HOSPITAL_BASED_OUTPATIENT_CLINIC_OR_DEPARTMENT_OTHER): Payer: Self-pay

## 2022-06-23 ENCOUNTER — Other Ambulatory Visit (HOSPITAL_BASED_OUTPATIENT_CLINIC_OR_DEPARTMENT_OTHER): Payer: Self-pay | Admitting: Family

## 2022-06-23 DIAGNOSIS — E785 Hyperlipidemia, unspecified: Secondary | ICD-10-CM

## 2022-06-23 MED ORDER — ATORVASTATIN CALCIUM 80 MG PO TABS
80.0000 mg | ORAL_TABLET | Freq: Every day | ORAL | 2 refills | Status: DC
  Filled 2022-06-23: qty 90, 90d supply, fill #0
  Filled 2022-09-15: qty 90, 90d supply, fill #1
  Filled 2022-12-14: qty 90, 90d supply, fill #2

## 2022-06-23 MED ORDER — HYDROCODONE-ACETAMINOPHEN 5-325 MG PO TABS
1.0000 | ORAL_TABLET | Freq: Two times a day (BID) | ORAL | 0 refills | Status: DC | PRN
  Filled 2022-06-23: qty 60, 30d supply, fill #0

## 2022-06-23 NOTE — Telephone Encounter (Signed)
Rx(s) sent to pharmacy electronically.  

## 2022-06-24 ENCOUNTER — Other Ambulatory Visit (HOSPITAL_BASED_OUTPATIENT_CLINIC_OR_DEPARTMENT_OTHER): Payer: Self-pay

## 2022-06-27 ENCOUNTER — Other Ambulatory Visit (HOSPITAL_BASED_OUTPATIENT_CLINIC_OR_DEPARTMENT_OTHER): Payer: Self-pay

## 2022-06-27 MED ORDER — TRAZODONE HCL 50 MG PO TABS
ORAL_TABLET | ORAL | 3 refills | Status: DC
  Filled 2022-06-27: qty 60, 30d supply, fill #0
  Filled 2022-08-03: qty 60, 30d supply, fill #1
  Filled 2022-09-01: qty 60, 30d supply, fill #2
  Filled 2022-10-04: qty 60, 30d supply, fill #3

## 2022-06-27 NOTE — Addendum Note (Signed)
Addended by: Olivianna Higley, Clarise Cruz E on: 06/27/2022 08:10 AM   Modules accepted: Orders

## 2022-06-28 ENCOUNTER — Other Ambulatory Visit: Payer: Self-pay | Admitting: Family Medicine

## 2022-06-29 ENCOUNTER — Other Ambulatory Visit: Payer: Self-pay | Admitting: Lab

## 2022-06-29 ENCOUNTER — Other Ambulatory Visit (HOSPITAL_BASED_OUTPATIENT_CLINIC_OR_DEPARTMENT_OTHER): Payer: Self-pay

## 2022-06-29 DIAGNOSIS — N471 Phimosis: Secondary | ICD-10-CM

## 2022-06-29 MED ORDER — SILDENAFIL CITRATE 100 MG PO TABS
ORAL_TABLET | ORAL | 0 refills | Status: DC
  Filled 2022-06-29: qty 8, 8d supply, fill #0

## 2022-07-01 ENCOUNTER — Other Ambulatory Visit (HOSPITAL_BASED_OUTPATIENT_CLINIC_OR_DEPARTMENT_OTHER): Payer: Self-pay

## 2022-07-07 ENCOUNTER — Encounter: Payer: Self-pay | Admitting: Internal Medicine

## 2022-07-18 ENCOUNTER — Other Ambulatory Visit: Payer: Self-pay | Admitting: Family Medicine

## 2022-07-18 ENCOUNTER — Other Ambulatory Visit (HOSPITAL_BASED_OUTPATIENT_CLINIC_OR_DEPARTMENT_OTHER): Payer: Self-pay

## 2022-07-18 ENCOUNTER — Other Ambulatory Visit: Payer: Self-pay | Admitting: Nurse Practitioner

## 2022-07-20 ENCOUNTER — Other Ambulatory Visit (HOSPITAL_BASED_OUTPATIENT_CLINIC_OR_DEPARTMENT_OTHER): Payer: Self-pay

## 2022-07-20 MED ORDER — METOPROLOL TARTRATE 75 MG PO TABS
75.0000 mg | ORAL_TABLET | Freq: Two times a day (BID) | ORAL | 0 refills | Status: DC
  Filled 2022-07-20: qty 60, 30d supply, fill #0

## 2022-07-20 MED ORDER — ROPINIROLE HCL 3 MG PO TABS
ORAL_TABLET | ORAL | 0 refills | Status: DC
  Filled 2022-07-20: qty 45, 30d supply, fill #0

## 2022-07-23 ENCOUNTER — Other Ambulatory Visit: Payer: Self-pay | Admitting: Nurse Practitioner

## 2022-07-23 DIAGNOSIS — M2550 Pain in unspecified joint: Secondary | ICD-10-CM

## 2022-07-25 MED ORDER — HYDROCODONE-ACETAMINOPHEN 5-325 MG PO TABS
1.0000 | ORAL_TABLET | Freq: Two times a day (BID) | ORAL | 0 refills | Status: DC | PRN
  Filled 2022-07-25: qty 60, 30d supply, fill #0

## 2022-07-26 ENCOUNTER — Other Ambulatory Visit (HOSPITAL_BASED_OUTPATIENT_CLINIC_OR_DEPARTMENT_OTHER): Payer: Self-pay

## 2022-07-26 MED ORDER — COMIRNATY 30 MCG/0.3ML IM SUSY
PREFILLED_SYRINGE | INTRAMUSCULAR | 0 refills | Status: DC
  Filled 2022-07-26: qty 0.3, 1d supply, fill #0

## 2022-07-28 ENCOUNTER — Other Ambulatory Visit: Payer: Self-pay | Admitting: Cardiology

## 2022-07-28 ENCOUNTER — Other Ambulatory Visit (HOSPITAL_BASED_OUTPATIENT_CLINIC_OR_DEPARTMENT_OTHER): Payer: Self-pay

## 2022-07-28 ENCOUNTER — Other Ambulatory Visit: Payer: Self-pay | Admitting: Family Medicine

## 2022-07-28 DIAGNOSIS — N471 Phimosis: Secondary | ICD-10-CM

## 2022-07-28 DIAGNOSIS — I2510 Atherosclerotic heart disease of native coronary artery without angina pectoris: Secondary | ICD-10-CM

## 2022-07-28 MED ORDER — EZETIMIBE 10 MG PO TABS
10.0000 mg | ORAL_TABLET | Freq: Every day | ORAL | 3 refills | Status: DC
  Filled 2022-07-28: qty 90, 90d supply, fill #0
  Filled 2022-11-08: qty 90, 90d supply, fill #1
  Filled 2023-02-04: qty 90, 90d supply, fill #2
  Filled 2023-06-12: qty 90, 90d supply, fill #3

## 2022-07-29 ENCOUNTER — Ambulatory Visit (INDEPENDENT_AMBULATORY_CARE_PROVIDER_SITE_OTHER): Payer: Medicare Other | Admitting: Nurse Practitioner

## 2022-07-29 ENCOUNTER — Encounter: Payer: Self-pay | Admitting: Nurse Practitioner

## 2022-07-29 ENCOUNTER — Other Ambulatory Visit (HOSPITAL_BASED_OUTPATIENT_CLINIC_OR_DEPARTMENT_OTHER): Payer: Self-pay

## 2022-07-29 ENCOUNTER — Other Ambulatory Visit: Payer: Self-pay | Admitting: Lab

## 2022-07-29 VITALS — BP 130/72 | HR 88 | Temp 98.6°F | Wt 223.2 lb

## 2022-07-29 DIAGNOSIS — K439 Ventral hernia without obstruction or gangrene: Secondary | ICD-10-CM | POA: Diagnosis not present

## 2022-07-29 DIAGNOSIS — I2510 Atherosclerotic heart disease of native coronary artery without angina pectoris: Secondary | ICD-10-CM

## 2022-07-29 DIAGNOSIS — I10 Essential (primary) hypertension: Secondary | ICD-10-CM

## 2022-07-29 DIAGNOSIS — I482 Chronic atrial fibrillation, unspecified: Secondary | ICD-10-CM | POA: Diagnosis not present

## 2022-07-29 DIAGNOSIS — M2550 Pain in unspecified joint: Secondary | ICD-10-CM

## 2022-07-29 DIAGNOSIS — G8929 Other chronic pain: Secondary | ICD-10-CM | POA: Diagnosis not present

## 2022-07-29 DIAGNOSIS — N471 Phimosis: Secondary | ICD-10-CM

## 2022-07-29 DIAGNOSIS — G2581 Restless legs syndrome: Secondary | ICD-10-CM | POA: Diagnosis not present

## 2022-07-29 DIAGNOSIS — E119 Type 2 diabetes mellitus without complications: Secondary | ICD-10-CM

## 2022-07-29 DIAGNOSIS — Z9861 Coronary angioplasty status: Secondary | ICD-10-CM

## 2022-07-29 DIAGNOSIS — F339 Major depressive disorder, recurrent, unspecified: Secondary | ICD-10-CM | POA: Diagnosis not present

## 2022-07-29 MED ORDER — NITROGLYCERIN 0.4 MG SL SUBL
0.4000 mg | SUBLINGUAL_TABLET | SUBLINGUAL | 3 refills | Status: AC | PRN
  Filled 2022-07-29: qty 25, 8d supply, fill #0
  Filled 2023-03-31 – 2023-04-01 (×2): qty 25, 8d supply, fill #1

## 2022-07-29 MED ORDER — HYDROCODONE-ACETAMINOPHEN 5-325 MG PO TABS
2.0000 | ORAL_TABLET | Freq: Two times a day (BID) | ORAL | 0 refills | Status: DC | PRN
  Filled 2022-07-29 – 2022-08-23 (×2): qty 120, 30d supply, fill #0

## 2022-07-29 MED ORDER — TIZANIDINE HCL 4 MG PO TABS
4.0000 mg | ORAL_TABLET | Freq: Two times a day (BID) | ORAL | 0 refills | Status: DC | PRN
  Filled 2022-07-29: qty 60, 30d supply, fill #0

## 2022-07-29 MED ORDER — METFORMIN HCL 500 MG PO TABS
500.0000 mg | ORAL_TABLET | Freq: Two times a day (BID) | ORAL | 3 refills | Status: DC
  Filled 2022-07-29: qty 180, fill #0
  Filled 2022-09-17: qty 180, 90d supply, fill #0
  Filled 2022-12-13: qty 180, 90d supply, fill #1
  Filled 2023-03-31 – 2023-04-01 (×2): qty 180, 90d supply, fill #2
  Filled 2023-07-18: qty 180, 90d supply, fill #3

## 2022-07-29 MED ORDER — OMEPRAZOLE 40 MG PO CPDR: DELAYED_RELEASE_CAPSULE | ORAL | 3 refills | Status: DC

## 2022-07-29 MED ORDER — ROPINIROLE HCL 3 MG PO TABS
ORAL_TABLET | ORAL | 3 refills | Status: DC
  Filled 2022-07-29: qty 45, fill #0
  Filled 2022-08-18: qty 45, 30d supply, fill #0
  Filled 2022-09-17: qty 45, 30d supply, fill #1
  Filled 2022-10-17: qty 45, 30d supply, fill #2
  Filled 2022-11-12 (×2): qty 45, 30d supply, fill #3

## 2022-07-29 MED ORDER — SILDENAFIL CITRATE 100 MG PO TABS
ORAL_TABLET | ORAL | 0 refills | Status: DC
  Filled 2022-07-29: qty 8, 8d supply, fill #0

## 2022-07-29 MED ORDER — BUPROPION HCL ER (XL) 150 MG PO TB24
ORAL_TABLET | ORAL | 3 refills | Status: DC
  Filled 2022-10-26: qty 270, 90d supply, fill #0
  Filled 2023-01-24: qty 270, 90d supply, fill #1
  Filled 2023-04-24: qty 270, 90d supply, fill #2
  Filled 2023-07-22: qty 270, 90d supply, fill #3

## 2022-07-29 MED ORDER — DOCUSATE SODIUM 100 MG PO CAPS
100.0000 mg | ORAL_CAPSULE | Freq: Every day | ORAL | 3 refills | Status: DC | PRN
  Filled 2022-07-29: qty 100, 100d supply, fill #0
  Filled 2022-11-13: qty 100, 100d supply, fill #1
  Filled 2023-02-24: qty 100, 100d supply, fill #2
  Filled 2023-07-18: qty 100, 100d supply, fill #3

## 2022-07-29 MED ORDER — METOPROLOL TARTRATE 75 MG PO TABS
75.0000 mg | ORAL_TABLET | Freq: Two times a day (BID) | ORAL | 3 refills | Status: DC
  Filled 2022-07-29: qty 120, 60d supply, fill #0
  Filled 2022-08-18: qty 60, 30d supply, fill #0
  Filled 2022-09-17: qty 60, 30d supply, fill #1
  Filled 2022-10-17: qty 60, 30d supply, fill #2
  Filled 2022-11-12 (×2): qty 60, 30d supply, fill #3
  Filled 2022-12-12: qty 60, 30d supply, fill #4
  Filled 2023-01-11: qty 60, 30d supply, fill #5
  Filled 2023-02-06: qty 60, 30d supply, fill #6
  Filled 2023-03-07: qty 60, 30d supply, fill #7

## 2022-07-29 NOTE — Patient Instructions (Addendum)
I have sent the referral to general surgery. I will check and make sure the surgeon is good.

## 2022-07-29 NOTE — Progress Notes (Signed)
Noted viagra refill ordered today.  Patient is now under the care of Jacolyn Reedy, NP, listed as her PCP, and establish care visit was October 3.  Further refills will need to be provided by his new PCP.  On chart review it appears he has nitroglycerin ordered as needed.  These cannot be combined. Called pt to clarify meds. He has not taken nitroglycerin in past 5-6 years. Advised of contraindication and to not combine these 2 meds. Understanding expressed.

## 2022-07-29 NOTE — Progress Notes (Signed)
Orma Render, DNP, AGNP-c [place], [place] [address] [phone]  Subjective:   Shaun [name] is a 66 y.o. male presents to day for evaluation of: Hernia Yvone Neu endorses concerns with an increase in size of ventral hernia that has been present for quite some time. He reports that the area has become quite painful and does not retract as well as it once did. He endorses he does not feel that his bowels move as easily as they have in the past, but he is still have bowel movements regularly. He can press the area, but it immediately comes back out. He denies fever, chills, nausea, vomiting, bloody stool, or completely liquid stool.   PMH, Medications, and Allergies reviewed and updated in chart as appropriate.   ROS negative except for what is listed in HPI. Objective:  BP 130/72   Pulse 88   Temp 98.6 F (37 C)   Wt 223 lb 3.2 oz (101.2 kg)   BMI 33.94 kg/m  Physical Exam Vitals and nursing note reviewed.  Constitutional:      Appearance: He is obese.  HENT:     Head: Normocephalic.  Cardiovascular:     Rate and Rhythm: Normal rate and regular rhythm.     Pulses: Normal pulses.     Heart sounds: Normal heart sounds.  Pulmonary:     Effort: Pulmonary effort is normal.     Breath sounds: Normal breath sounds.  Abdominal:     General: There is distension.     Tenderness: There is abdominal tenderness.     Hernia: A hernia is present.     Comments: Large ventral herniation noted to the right of the midline. Movement of intestinal contents palpable. Hernia reducible, but immediately returns to external state. No symptoms of strangulation present.   Musculoskeletal:        General: Tenderness present.     Lumbar back: Spasms, tenderness and bony tenderness present. Decreased range of motion. Positive right straight leg raise test and positive left straight leg raise test.  Skin:    General: Skin is warm and dry.     Capillary  Refill: Capillary refill takes less than 2 seconds.  Neurological:     General: No focal deficit present.     Mental Status: He is alert and oriented to person, place, and time.  Psychiatric:        Mood and Affect: Mood normal.           Assessment & Plan:   Problem List Items Addressed This Visit     Chronic atrial fibrillation (HCC)    Chronic.No alarm sx. Anticoagulated. Labs pending. Continue current regimen.       Relevant Medications   nitroGLYCERIN (NITROSTAT) 0.4 MG SL tablet   Metoprolol Tartrate 75 MG TABS   CAD S/P percutaneous coronary angioplasty    Chronic. No alarm sx at this time.       Relevant Medications   nitroGLYCERIN (NITROSTAT) 0.4 MG SL tablet   Metoprolol Tartrate 75 MG TABS   Essential hypertension    Chronic. BP stable at this time. Goal <140/85. Continue current medication regimen and at home monitoring. No alarm sx present.       Relevant Medications   nitroGLYCERIN (NITROSTAT) 0.4 MG SL tablet   Metoprolol Tartrate 75 MG TABS   Non-insulin treated type 2 diabetes mellitus (HCC)    Chronic. Labs pending. Continue current regimen. Will make changes to plan of care based  on labs.       Relevant Medications   metFORMIN (GLUCOPHAGE) 500 MG tablet   RLS (restless legs syndrome)    Chronic. Refills provided. Labs pending.       Relevant Medications   rOPINIRole (REQUIP) 3 MG tablet   Ventral hernia - Primary    Chronic. Worsening. Concern for increased pain and pressure in abdominal region. No signs of strangulation present, but given the increase in size I do feel that surgical intervention may be necessary. Referral placed for evaluation. Alarm sx discussed with patient that would warrant immediate evaluation.       Relevant Orders   Ambulatory referral to General Surgery   Other Visit Diagnoses     Recurrent major depressive disorder, remission status unspecified ([place])       Relevant Medications   buPROPion (WELLBUTRIN XL) 150 MG  24 hr tablet   Polyarthralgia       Relevant Medications   HYDROcodone-acetaminophen (NORCO/VICODIN) 5-325 MG tablet   tiZANidine (ZANAFLEX) 4 MG tablet   Other chronic pain       Relevant Medications   buPROPion (WELLBUTRIN XL) 150 MG 24 hr tablet   HYDROcodone-acetaminophen (NORCO/VICODIN) 5-325 MG tablet   tiZANidine (ZANAFLEX) 4 MG tablet   Other Relevant Orders   Ambulatory referral to Spine Surgery         Orma Render, DNP, AGNP-c 08/15/2022  7:48 AM    History, Medications, Surgery, SDOH, and Family History reviewed and updated as appropriate.

## 2022-07-30 ENCOUNTER — Other Ambulatory Visit (HOSPITAL_BASED_OUTPATIENT_CLINIC_OR_DEPARTMENT_OTHER): Payer: Self-pay

## 2022-08-03 ENCOUNTER — Other Ambulatory Visit: Payer: Self-pay

## 2022-08-15 NOTE — Assessment & Plan Note (Signed)
>>  ASSESSMENT AND PLAN FOR VENTRAL HERNIA WRITTEN ON 08/15/2022  7:48 AM BY Reet Scharrer E, NP  Chronic. Worsening. Concern for increased pain and pressure in abdominal region. No signs of strangulation present, but given the increase in size I do feel that surgical intervention may be necessary. Referral placed for evaluation. Alarm sx discussed with patient that would warrant immediate evaluation.

## 2022-08-15 NOTE — Assessment & Plan Note (Signed)
Chronic. Worsening. Concern for increased pain and pressure in abdominal region. No signs of strangulation present, but given the increase in size I do feel that surgical intervention may be necessary. Referral placed for evaluation. Alarm sx discussed with patient that would warrant immediate evaluation.

## 2022-08-15 NOTE — Assessment & Plan Note (Signed)
Chronic. No alarm sx at this time.

## 2022-08-15 NOTE — Assessment & Plan Note (Signed)
Chronic. Refills provided. Labs pending.

## 2022-08-15 NOTE — Assessment & Plan Note (Signed)
Chronic. BP stable at this time. Goal <140/85. Continue current medication regimen and at home monitoring. No alarm sx present.

## 2022-08-15 NOTE — Assessment & Plan Note (Signed)
Chronic. Labs pending. Continue current regimen. Will make changes to plan of care based on labs.

## 2022-08-15 NOTE — Assessment & Plan Note (Signed)
Chronic.No alarm sx. Anticoagulated. Labs pending. Continue current regimen.

## 2022-08-18 ENCOUNTER — Other Ambulatory Visit (HOSPITAL_BASED_OUTPATIENT_CLINIC_OR_DEPARTMENT_OTHER): Payer: Self-pay

## 2022-08-23 ENCOUNTER — Telehealth (INDEPENDENT_AMBULATORY_CARE_PROVIDER_SITE_OTHER): Payer: Medicare Other | Admitting: Nurse Practitioner

## 2022-08-23 ENCOUNTER — Other Ambulatory Visit (HOSPITAL_BASED_OUTPATIENT_CLINIC_OR_DEPARTMENT_OTHER): Payer: Self-pay

## 2022-08-23 ENCOUNTER — Encounter: Payer: Self-pay | Admitting: Nurse Practitioner

## 2022-08-23 VITALS — HR 90 | Temp 97.8°F | Ht 68.0 in | Wt 222.0 lb

## 2022-08-23 DIAGNOSIS — J069 Acute upper respiratory infection, unspecified: Secondary | ICD-10-CM | POA: Diagnosis not present

## 2022-08-23 DIAGNOSIS — B9689 Other specified bacterial agents as the cause of diseases classified elsewhere: Secondary | ICD-10-CM

## 2022-08-23 MED ORDER — AZITHROMYCIN 250 MG PO TABS
ORAL_TABLET | ORAL | 0 refills | Status: AC
  Filled 2022-08-23: qty 6, 5d supply, fill #0

## 2022-08-23 MED ORDER — PREDNISONE 20 MG PO TABS
ORAL_TABLET | ORAL | 0 refills | Status: DC
  Filled 2022-08-23: qty 13, 7d supply, fill #0

## 2022-08-23 MED ORDER — PSEUDOEPH-BROMPHEN-DM 30-2-10 MG/5ML PO SYRP
2.5000 mL | ORAL_SOLUTION | Freq: Two times a day (BID) | ORAL | 0 refills | Status: DC | PRN
  Filled 2022-08-23 (×2): qty 120, 24d supply, fill #0

## 2022-08-23 NOTE — Progress Notes (Signed)
Virtual Visit Encounter mychart visit.   I connected with  Shaun [name] on 08/23/22 at 10:45 AM EST by secure video and audio telemedicine application. I verified that I am speaking with the correct person using two identifiers.   I introduced myself as a Designer, jewellery with the practice. The limitations of evaluation and management by telemedicine discussed with the patient and the availability of in person appointments. The patient expressed verbal understanding and consent to proceed.  Participating parties in this visit include: Myself and patient  The patient is: Patient Location: Home I am: Provider Location: Office/Clinic Subjective:    CC and HPI: Shaun [name] is a 66 y.o. year old male presenting for new evaluation and treatment of cough. Patient reports the following: Yvone Neu tells me he started feeling bad Friday with symptoms of cough and subsequent shortness of breath, aching, and generalized feeling of unwell. He does not think his temperature has been up. He has not had a sore throat. His worst symptom is cough. He tells me he is quite short of breath when up and moving around, but feels better when is is resting. He has taken a cough and cold medication for high blood pressure and this has helped minimally.   Past medical history, Surgical history, Family history not pertinant except as noted below, Social history, Allergies, and medications have been entered into the medical record, reviewed, and corrections made.   Review of Systems:  All review of systems negative except what is listed in the HPI  Objective:    Alert and oriented x 4 Cough and congestion present.  Appears unwell.  Speaking in clear sentences with mild shortness of breath. No distress.  Impression and Recommendations:    Problem List Items Addressed This Visit   None   orders and follow up as documented in EMR I discussed the assessment and treatment plan with the patient. The  patient was provided an opportunity to ask questions and all were answered. The patient agreed with the plan and demonstrated an understanding of the instructions.   The patient was advised to call back or seek an in-person evaluation if the symptoms worsen or if the condition fails to improve as anticipated.  Follow-Up: if no improvement in the next few days.   I provided 20 minutes of non-face-to-face interaction with this non face-to-face encounter including intake, same-day documentation, and chart review.   Orma Render, NP , DNP, AGNP-c [place] at [facility] [phone] (fax)

## 2022-08-29 DIAGNOSIS — B9689 Other specified bacterial agents as the cause of diseases classified elsewhere: Secondary | ICD-10-CM | POA: Insufficient documentation

## 2022-08-29 HISTORY — DX: Other specified bacterial agents as the cause of diseases classified elsewhere: B96.89

## 2022-08-29 NOTE — Assessment & Plan Note (Signed)
Upper respiratory symptoms and cough in the setting of underlying COPD and multiple co-morbidities. At this time he is not in distress, however, given his medical conditions I am concerned this could quickly deteriorate. We will begin treatment today with azithromycin for atypical infection in the setting of COPD and a prednisone taper. I will also add cough syrup to help control cough and breathing. Discussed symptoms that would warrant emergency evaluation. He will follow up if he does not have substantial improvement within the next 4-5 days.

## 2022-08-30 ENCOUNTER — Ambulatory Visit: Payer: Self-pay | Admitting: Surgery

## 2022-08-30 DIAGNOSIS — I482 Chronic atrial fibrillation, unspecified: Secondary | ICD-10-CM | POA: Diagnosis not present

## 2022-08-30 DIAGNOSIS — K432 Incisional hernia without obstruction or gangrene: Secondary | ICD-10-CM | POA: Diagnosis not present

## 2022-08-30 NOTE — H&P (Signed)
History of Present Illness: Shaun [name] is a 67 y.o. male who is seen today for follow up of a ventral hernia. He previously saw me in the summer of 2022 and scheduled for elective repair, but prior to surgery was admitted with influenza, volume overload and respiratory failure, and surgery was cancelled. He has continued to have symptoms from his hernia, and reports frequent pain. He says the hernia sometimes becomes very firm. He denies nausea or vomiting. He previously had an abdominal CT last year as part of his workup, which showed a 5cm fascial defect.   He is on Eliquis for a-fib and also has OSA. He has a history of CAD with stent placement in 2016. Last echo in December 2022 showed normal LV function. He says he gets short of breath after walking long distances but denies dyspnea with mild to moderate exertion. He is able to climb a flight of stairs without difficulty. He has quit smoking. He has previously had abdominal surgery for a bowel perforation.     Review of Systems: A complete review of systems was obtained from the patient.  I have reviewed this information and discussed as appropriate with the patient.  See HPI as well for other ROS.       Medical History: Past Medical History Past Medical History: Diagnosis Date  Anxiety    Arrhythmia    COPD (chronic obstructive pulmonary disease) (CMS-HCC)    Diabetes mellitus without complication (CMS-HCC)    Hyperlipidemia    Hypertension    Sleep apnea        There is no problem list on file for this patient.     Past Surgical History History reviewed. No pertinent surgical history.     Allergies No Known Allergies    Current Outpatient Medications on File Prior to Visit Medication Sig Dispense Refill  acetaminophen (TYLENOL) 325 MG tablet Take by mouth      atorvastatin (LIPITOR) 80 MG tablet Take by mouth      docusate (COLACE) 100 MG capsule Take by mouth      ezetimibe (ZETIA) 10 mg tablet Take 1 tablet by  mouth once daily      magnesium gluconate (MAGONATE) 27.5 mg magne- sium (500 mg) tablet Take by mouth      metFORMIN (GLUCOPHAGE) 500 MG tablet TAKE 1 TABLET (500 MG) BY MOUTH TWICE DAILY WITH A MEAL.      omeprazole (PRILOSEC) 40 MG DR capsule Take 1 capsule by mouth every morning before breakfast (0630)      potassium chloride (KLOR-CON) 20 MEQ ER tablet Take 1 tablet by mouth once daily      rOPINIRole (REQUIP) 3 MG tablet Take 1 tab nightly for RLS and a 1/2 tab during the day to control breakthrough symptoms      traZODone (DESYREL) 50 MG tablet TAKE 1 TABLET BY MOUTH EVERY NIGHT AT BEDTIME FOR 7 DAYS THEN TAKE 2 TABLETS BY MOUTH EVERY NIGHT AT BEDTIME      triamcinolone 0.1 % ointment Apply 1 Application  topically 2 (two) times daily       No current facility-administered medications on file prior to visit.     Family History Family History Problem Relation Age of Onset  Breast cancer Mother    High blood pressure (Hypertension) Father    Hyperlipidemia (Elevated cholesterol) Father    Heart valve disease Father    Skin cancer Sister    Diabetes Maternal Aunt  Social History   Tobacco Use Smoking Status Some Days  Types: Cigarettes Smokeless Tobacco Current     Social History Social History    Socioeconomic History  Marital status: Married Tobacco Use  Smoking status: Some Days     Types: Cigarettes  Smokeless tobacco: Current Substance and Sexual Activity  Alcohol use: Never  Drug use: Never      Objective:     Vitals:   08/30/22 0957 BP: 130/70 Pulse: 90 Temp: 36.9 C (98.4 F) SpO2: 97% Weight: 100 kg (220 lb 6.4 oz) Height: 172.7 cm ('5\' 8"'$ )   Body mass index is 33.51 kg/m.   Physical Exam Vitals reviewed.  Constitutional:      General: He is not in acute distress.    Appearance: Normal appearance.  HENT:     Head: Normocephalic and atraumatic.  Cardiovascular:     Rate and Rhythm: Normal rate and regular rhythm.  Pulmonary:      Effort: Pulmonary effort is normal. No respiratory distress.     Breath sounds: Normal breath sounds. No wheezing.  Abdominal:     General: There is no distension.     Palpations: Abdomen is soft.     Tenderness: There is no abdominal tenderness.     Comments: Well-healed midline scar, with incisional hernia at the periumbilical aspect of the incision. Hernia is not reducible but is soft and nontender.  Skin:    General: Skin is warm and dry.     Coloration: Skin is not jaundiced.  Neurological:     General: No focal deficit present.     Mental Status: He is alert and oriented to person, place, and time.              Assessment and Plan:    Diagnoses and all orders for this visit:   Ventral incisional hernia   Chronic a-fib (CMS-HCC)     This is a 67 yo male presenting with a chronically incarcerated ventral incisional hernia, which is symptomatic but no signs of obstruction or strangulation. He has quit smoking. I offered open repair with mesh placement. I reviewed the details of this procedure, including the benefits and risks of infection, bowel injury, and recurrence. He may require adhesiolysis given prior surgery. He will be admitted overnight postoperatively. Will request preoperative risk stratification from his cardiologist, as well as clearance to hold Eliquis for 48 hours prior to surgery. If he is deemed high risk for periop cardiac complications, I would not recommend repair unless he develops obstruction or very severe symptoms. He will be contacted to schedule an elective surgery date once cardiac risk stratification has been obtained.  Michaelle Birks, [facility], Hepatobiliary and Pancreatic Surgery 08/30/22 6:59 PM

## 2022-08-31 ENCOUNTER — Encounter: Payer: Self-pay | Admitting: Internal Medicine

## 2022-08-31 ENCOUNTER — Ambulatory Visit (INDEPENDENT_AMBULATORY_CARE_PROVIDER_SITE_OTHER): Payer: Medicare Other | Admitting: Internal Medicine

## 2022-08-31 VITALS — BP 100/68 | HR 79 | Temp 97.8°F | Ht 68.0 in | Wt 220.7 lb

## 2022-08-31 DIAGNOSIS — K222 Esophageal obstruction: Secondary | ICD-10-CM

## 2022-08-31 DIAGNOSIS — D509 Iron deficiency anemia, unspecified: Secondary | ICD-10-CM

## 2022-08-31 DIAGNOSIS — K219 Gastro-esophageal reflux disease without esophagitis: Secondary | ICD-10-CM

## 2022-08-31 DIAGNOSIS — D123 Benign neoplasm of transverse colon: Secondary | ICD-10-CM

## 2022-08-31 NOTE — Patient Instructions (Signed)
I am happy to hear that you are doing well.  Continue omeprazole daily.  If you need refills then let us know and we will send them in.  Continue to monitor your swallowing.  If you have issues with food getting stuck in your chest then let us know.  We can set you up for upper endoscopy with repeat dilation.  You will be due for colonoscopy 2027.  Otherwise follow-up in 1 year or sooner if needed.  It is always a pleasure seeing you.  Dr. Abbey Chatters

## 2022-08-31 NOTE — Progress Notes (Signed)
Referring Provider: Orma Render, NP Primary Care Physician:  Orma Render, NP Primary GI:  Dr. Abbey Chatters  Chief Complaint  Patient presents with   Follow-up    Patient here today for a follow up. Patient doing well on Omeprazole 40 mg daily. He has occasional issues with reflux,but pretty stable. Patient denies any current gi issues.     HPI:   Shaun [name] is a 67 y.o. male who presents to clinic today for follow-up visit.  History of iron deficiency anemia now improved, stable hemoglobin at 12.9.  Followed by PCP on oral iron.  EGD/TCS 06/2018: low-grade narrowing Schatzki ring which was dilated.  Gastritis.  32 polyps removed from the colon, 10 were tubular adenomas.  Capsule endoscopy to complete GI work-up 2020 was normal.  On chronic Eliquis.  Colonoscopy 06/07/2021 with 4 small tubular adenomas removed.  Recommended recall 5 years.  Today, states he is doing well.  Does have chronic GERD.  Takes omeprazole 40 mg daily.  States his symptoms are well controlled.  No epigastric or chest pain.  No dysphagia/odynophagia.  Past Medical History:  Diagnosis Date   Anemia    Anxiety    Asthma    CAD (coronary artery disease)    DES to distal circumflex 2016   Cataract    Colon polyps    30 colon polyps found on first colonoscopy   Diastolic heart failure (HCC)    Diverticulitis    DJD (degenerative joint disease)    GERD (gastroesophageal reflux disease)    History of kidney stones    Hyperlipidemia    Hypertension    Insomnia    Obstructive sleep apnea 12/2009   01/26/2010 AHI 83/hr   Permanent atrial fibrillation ([place])    Onset 2006 paroxysmal then progressive to persistent   PUD (peptic ulcer disease)    1980s   RLS (restless legs syndrome)    Sinusitis    Skin cancer    Type 2 diabetes mellitus ([place])     Past Surgical History:  Procedure Laterality Date   BIOPSY  07/17/2018   Procedure: BIOPSY;  Surgeon: Danie Binder, MD;  [facility];   Service: Endoscopy;;  colon   BOWEL RESECTION  09/17/2018   SMALL BOWEL RESECTION: 71 CM    CARDIAC CATHETERIZATION N/A 07/21/2015   Procedure: Left Heart Cath and Coronary Angiography;  Surgeon: Peter M Martinique, MD;  Location: [facility];  Service: Cardiovascular;  Laterality: N/A;   CARDIAC CATHETERIZATION N/A 07/21/2015   Procedure: Coronary Stent Intervention;  Surgeon: Peter M Martinique, MD;  Location: [facility];  Service: Cardiovascular;  Laterality: N/A;   CIRCUMCISION N/A 04/05/2019   Procedure: CIRCUMCISION ADULT;  Surgeon: Irine Seal, MD;  Location: [facility];  Service: Urology;  Laterality: N/A;   COLONOSCOPY N/A 05/19/2014   Dr. Barnie Alderman diverticulosis/moderate external hemorrhoids, >20 simple adenomas. Genetic screening negative.    COLONOSCOPY WITH PROPOFOL N/A 07/17/2018   Dr. Oneida Alar: Diverticulosis, external/internal hemorrhoids, 32 colon polyps removed.  ten tubular adenomas removed with no high-grade dysplasia.  Advised to have surveillance colonoscopy in 3 years.   COLONOSCOPY WITH PROPOFOL N/A 06/07/2021   Procedure: COLONOSCOPY WITH PROPOFOL;  Surgeon: Eloise Harman, DO;  [facility];  Service: Endoscopy;  Laterality: N/A;  9:30 / ASA 3  (Pt was told that his time will be given at Pre-op)   ESOPHAGOGASTRODUODENOSCOPY (EGD) WITH PROPOFOL N/A 07/17/2018   Dr. Oneida Alar: Low-grade narrowing  Schatzki ring at the GE junction status post dilation.  Gastritis.  Biopsy with mild nonspecific reactive gastropathy.  No H. pylori.   GIVENS CAPSULE STUDY N/A 06/24/2019   normal   HERNIA REPAIR  1986   Left inguinal   INTRAVASCULAR PRESSURE WIRE/FFR STUDY Left 06/08/2017   Procedure: INTRAVASCULAR PRESSURE WIRE/FFR STUDY;  Surgeon: Nelva Bush, MD;  Location: [facility];  Service: Cardiovascular;  Laterality: Left;  LAD and CFX   LAPAROTOMY N/A 09/17/2018   Procedure: EXPLORATORY LAPAROTOMY;  Surgeon: Virl Cagey, MD;  Location: [facility];   Service: General;  Laterality: N/A;   LEFT HEART CATH AND CORONARY ANGIOGRAPHY N/A 06/08/2017   Procedure: LEFT HEART CATH AND CORONARY ANGIOGRAPHY;  Surgeon: Nelva Bush, MD;  Location: [facility];  Service: Cardiovascular;  Laterality: N/A;   POLYPECTOMY  07/17/2018   Procedure: POLYPECTOMY;  Surgeon: Danie Binder, MD;  [facility];  Service: Endoscopy;;  colon   POLYPECTOMY  06/07/2021   Procedure: POLYPECTOMY INTESTINAL;  Surgeon: Eloise Harman, DO;  [facility];  Service: Endoscopy;;   ROTATOR CUFF REPAIR     Right   SAVORY DILATION N/A 07/17/2018   Procedure: SAVORY DILATION;  Surgeon: Danie Binder, MD;  [facility];  Service: Endoscopy;  Laterality: N/A;    Current Outpatient Medications  Medication Sig Dispense Refill   acetaminophen (TYLENOL) 325 MG tablet Take 2 tablets (650 mg total) by mouth every 6 (six) hours as needed for mild pain.     albuterol (VENTOLIN HFA) 108 (90 Base) MCG/ACT inhaler INHALE 2 PUFFS INTO THE LUNGS EVERY 6 HOURS AS NEEDED FOR WHEEZING OR SHORTNESS OF BREATH 18 g 11   apixaban (ELIQUIS) 5 MG TABS tablet TAKE 1 TABLET (5 MG) BY MOUTH TWICE DAILY 180 tablet 2   atorvastatin (LIPITOR) 80 MG tablet Take 1 tablet (80 mg total) by mouth daily. TAKE 1 TABLET(80 MG) BY MOUTH EVERY EVENING 90 tablet 2   azelastine (ASTELIN) 0.1 % nasal spray Place 1 spray into both nostrils 2 (two) times daily. Use in each nostril as directed 30 mL 12   brompheniramine-pseudoephedrine-DM 30-2-10 MG/5ML syrup Take 2.5 mLs by mouth 2 (two) times daily as needed. 120 mL 0   buPROPion (WELLBUTRIN XL) 150 MG 24 hr tablet Take 3 tablets once daily 270 tablet 3   docusate sodium (COLACE) 100 MG capsule Take 1 capsule (100 mg total) by mouth daily as needed for mild constipation. 100 capsule 3   ezetimibe (ZETIA) 10 MG tablet Take 1 tablet (10 mg total) by mouth daily. 90 tablet 3   ferrous sulfate 325 (65 FE) MG tablet Take 325 mg  by mouth daily with breakfast.     furosemide (LASIX) 40 MG tablet Take 1 tablet (40 mg total) by mouth daily. 90 tablet 2   HYDROcodone-acetaminophen (NORCO/VICODIN) 5-325 MG tablet Take 2 tablets by mouth 2 (two) times daily as needed for moderate pain. 120 tablet 0   magnesium 30 MG tablet Take 30 mg by mouth daily at 6 (six) AM.     metFORMIN (GLUCOPHAGE) 500 MG tablet TAKE 1 TABLET (500 MG) BY MOUTH TWICE DAILY WITH A MEAL. 180 tablet 3   Metoprolol Tartrate 75 MG TABS Take 75 mg by mouth 2 (two) times daily. 120 tablet 3   nitroGLYCERIN (NITROSTAT) 0.4 MG SL tablet Place 1 tablet (0.4 mg total) under the tongue every 5 (five) minutes as needed. 25 tablet 3  omeprazole (PRILOSEC) 40 MG capsule Take 1 capsule by mouth once daily 30 minutes before breakfast 90 capsule 3   potassium chloride SA (KLOR-CON M) 20 MEQ tablet Take 1 tablet (20 mEq total) by mouth daily. 90 tablet 3   rOPINIRole (REQUIP) 3 MG tablet Take 1 tab nightly for RLS and a 1/2 tab during the day to control breakthrough symptoms 45 tablet 3   sildenafil (VIAGRA) 100 MG tablet TAKE 1 TABLET BY MOUTH 30 MINUTES BEFORE ACTIVITY 8 tablet 0   sucralfate (CARAFATE) 1 g tablet Take one tablet po BID PRN (Patient taking differently: Take 1 g by mouth 2 (two) times daily as needed (acid reflux).) 42 tablet 5   tiZANidine (ZANAFLEX) 4 MG tablet Take 1 tablet (4 mg total) by mouth 2 (two) times daily as needed for muscle spasms. 60 tablet 0   traZODone (DESYREL) 50 MG tablet TAKE 1 TABLET BY MOUTH EVERY NIGHT AT BEDTIME FOR 7 DAYS THEN TAKE 2 TABLETS BY MOUTH EVERY NIGHT AT BEDTIME 60 tablet 3   triamcinolone ointment (KENALOG) 0.1 % Apply 1 application. topically 2 (two) times daily. 90 g 1   No current facility-administered medications for this visit.    Allergies as of 08/31/2022   (No Known Allergies)    Family History  Problem Relation Age of Onset   Hypertension Mother    Breast cancer Mother 51       brain/bone    Heart  attack Father    Skin cancer Sister 97   Diabetes Brother    Parkinson's disease Brother    Brain cancer Maternal Uncle    Cancer Maternal Uncle        NOS   Breast cancer Cousin        maternal cousin dx <50   Cancer Cousin    Colon cancer Neg Hx     Social History   Socioeconomic History   Marital status: Married    Spouse name: [name] Rossitto   Number of children: 5   Years of education: 12   Highest education level: 12th grade  Occupational History   Occupation: employed    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  Tobacco Use   Smoking status: Some Days    Packs/day: 0.50    Years: 39.00    Total pack years: 19.50    Types: Cigarettes    Start date: 07/24/1970    Last attempt to quit: 08/06/2021    Years since quitting: 1.0    Passive exposure: Past   Smokeless tobacco: Never  Vaping Use   Vaping Use: Never used  Substance and Sexual Activity   Alcohol use: No    Alcohol/week: 0.0 standard drinks of alcohol   Drug use: No   Sexual activity: Yes    Partners: Female  Other Topics Concern   Not on file  Social History Narrative   Not on file   Social Determinants of Health   Financial Resource Strain: Low Risk  (02/02/2022)   Overall Financial Resource Strain (CARDIA)    Difficulty of Paying Living Expenses: Not hard at all  Food Insecurity: No Food Insecurity (02/02/2022)   Hunger Vital Sign    Worried About Running Out of Food in the Last Year: Never true    Ran Out of Food in the Last Year: Never true  Transportation Needs: No Transportation Needs (02/02/2022)   PRAPARE - Hydrologist (Medical): No    Lack of Transportation (  Non-Medical): No  Physical Activity: Sufficiently Active (02/02/2022)   Exercise Vital Sign    Days of Exercise per Week: 5 days    Minutes of Exercise per Session: 40 min  Recent Concern: Physical Activity - Inactive (11/04/2021)   Exercise Vital Sign    Days of Exercise per Week: 0 days    Minutes of  Exercise per Session: 0 min  Stress: No Stress Concern Present (02/02/2022)   [place]    Feeling of Stress : Not at all  Social Connections: Moderately Isolated (02/02/2022)   Social Connection and Isolation Panel [NHANES]    Frequency of Communication with Friends and Family: Three times a week    Frequency of Social Gatherings with Friends and Family: Three times a week    Attends Religious Services: Never    Active Member of Clubs or Organizations: No    Attends Archivist Meetings: Never    Marital Status: Married    Subjective: Review of Systems  Constitutional:  Negative for chills and fever.  HENT:  Negative for congestion and hearing loss.   Eyes:  Negative for blurred vision and double vision.  Respiratory:  Negative for cough and shortness of breath.   Cardiovascular:  Negative for chest pain and palpitations.  Gastrointestinal:  Positive for heartburn. Negative for abdominal pain, blood in stool, constipation, diarrhea, melena and vomiting.  Genitourinary:  Negative for dysuria and urgency.  Musculoskeletal:  Negative for joint pain and myalgias.  Skin:  Negative for itching and rash.  Neurological:  Negative for dizziness and headaches.  Psychiatric/Behavioral:  Negative for depression. The patient is not nervous/anxious.      Objective: BP 100/68 (BP Location: Left Arm, Patient Position: Sitting, Cuff Size: Large)   Pulse 79   Temp 97.8 F (36.6 C) (Temporal)   Ht '5\' 8"'$  (1.727 m)   Wt 220 lb 11.2 oz (100.1 kg)   BMI 33.56 kg/m  Physical Exam Constitutional:      Appearance: Normal appearance.  HENT:     Head: Normocephalic and atraumatic.  Eyes:     Extraocular Movements: Extraocular movements intact.     Conjunctiva/sclera: Conjunctivae normal.  Cardiovascular:     Rate and Rhythm: Normal rate and regular rhythm.  Pulmonary:     Effort: Pulmonary effort is normal.     Breath  sounds: Normal breath sounds.  Abdominal:     General: Bowel sounds are normal.     Palpations: Abdomen is soft.  Musculoskeletal:        General: Normal range of motion.     Cervical back: Normal range of motion and neck supple.  Skin:    General: Skin is warm.  Neurological:     General: No focal deficit present.     Mental Status: He is alert and oriented to person, place, and time.  Psychiatric:        Mood and Affect: Mood normal.        Behavior: Behavior normal.      Assessment: *Chronic GERD-well-controlled on omeprazole *Dysphagia/Schatzki ring  *Adenomatous colon polyps *Iron deficiency anemia-improved on oral iron  Plan: GERD well-controlled on omeprazole.  We will continue.    Dysphagia mild, intermittent. Counseled if worsens to let us know and we can repeat dilation.  Colonoscopy recall October 2027.  Follow-up in 1 year or sooner if needed  08/31/2022 2:49 PM   Disclaimer: This note was dictated with voice recognition software. Similar sounding  words can inadvertently be transcribed and may not be corrected upon review.

## 2022-09-01 ENCOUNTER — Other Ambulatory Visit: Payer: Self-pay | Admitting: Nurse Practitioner

## 2022-09-01 ENCOUNTER — Other Ambulatory Visit (HOSPITAL_BASED_OUTPATIENT_CLINIC_OR_DEPARTMENT_OTHER): Payer: Self-pay

## 2022-09-01 ENCOUNTER — Other Ambulatory Visit: Payer: Self-pay

## 2022-09-01 ENCOUNTER — Other Ambulatory Visit: Payer: Self-pay | Admitting: Family Medicine

## 2022-09-01 DIAGNOSIS — N471 Phimosis: Secondary | ICD-10-CM

## 2022-09-02 ENCOUNTER — Other Ambulatory Visit (HOSPITAL_BASED_OUTPATIENT_CLINIC_OR_DEPARTMENT_OTHER): Payer: Self-pay

## 2022-09-02 MED ORDER — SILDENAFIL CITRATE 100 MG PO TABS
ORAL_TABLET | ORAL | 0 refills | Status: DC
  Filled 2022-09-02: qty 8, 8d supply, fill #0

## 2022-09-15 ENCOUNTER — Telehealth: Payer: Self-pay | Admitting: *Deleted

## 2022-09-15 NOTE — Telephone Encounter (Signed)
   Pre-operative Risk Assessment    Patient Name: Shaun [name]  DOB: Apr 06, 1956 MRN: [id number]      Request for Surgical Clearance    Procedure:   HERNIA REPAIR  Date of Surgery:  Clearance TBD                                 Surgeon:  Michaelle Birks, MD Surgeon's Group or Practice Name:  [facility] Phone number:  [phone] Fax number:  [phone]   Type of Clearance Requested:   - Pharmacy:  Hold Apixaban (Eliquis) NOT INDICATED HOW LONG   Type of Anesthesia:  General    Additional requests/questions:    Astrid Divine   09/15/2022, 11:04 AM

## 2022-09-16 NOTE — Telephone Encounter (Signed)
Patient with diagnosis of A Fib on Eliquis for anticoagulation.    Procedure: Hernia Repair Date of procedure: TBD   CHA2DS2-VASc Score = 5  This indicates a 7.2% annual risk of stroke. The patient's score is based upon: CHF History: 1 HTN History: 1 Diabetes History: 1 Stroke History: 0 Vascular Disease History: 1 Age Score: 1 Gender Score: 0   CrCl 78 mL/min using adjusted body weight Platelet count 186K   Per office protocol, patient can hold Eliquis for 2 days prior to procedure.    **This guidance is not considered finalized until pre-operative APP has relayed final recommendations.**

## 2022-09-16 NOTE — Telephone Encounter (Signed)
I left a message for the patient to call our office back to schedule a tele visit for pre-op.

## 2022-09-16 NOTE — Telephone Encounter (Signed)
   Name: Shaun [name]  DOB: 13-Jun-1956  MRN: [id number]  Primary Cardiologist: Kirk Ruths, MD   Preoperative team, please contact this patient and set up a phone call appointment for further preoperative risk assessment. Please obtain consent and complete medication review. Thank you for your help.  I confirm that guidance regarding antiplatelet and oral anticoagulation therapy has been completed and, if necessary, noted below.  Pharmacy has addressed anticoagulation request.   Deberah Pelton, NP 09/16/2022, 11:33 AM [name]

## 2022-09-17 ENCOUNTER — Other Ambulatory Visit (HOSPITAL_BASED_OUTPATIENT_CLINIC_OR_DEPARTMENT_OTHER): Payer: Self-pay

## 2022-09-19 ENCOUNTER — Telehealth: Payer: Self-pay | Admitting: *Deleted

## 2022-09-19 ENCOUNTER — Other Ambulatory Visit: Payer: Self-pay

## 2022-09-19 NOTE — Telephone Encounter (Signed)
Pt has been scheduled a tele vsiit, 09/20/22 9:40.  Consent on file / medications reconciled.    Patient Consent for Virtual Visit        Shaun [name] has provided verbal consent on 09/19/2022 for a virtual visit (video or telephone).   CONSENT FOR VIRTUAL VISIT FOR:  Shaun [name]  By participating in this virtual visit I agree to the following:  I hereby voluntarily request, consent and authorize [facility] and its employed or contracted physicians, physician assistants, nurse practitioners or other licensed health care professionals (the Practitioner), to provide me with telemedicine health care services (the "Services") as deemed necessary by the treating Practitioner. I acknowledge and consent to receive the Services by the Practitioner via telemedicine. I understand that the telemedicine visit will involve communicating with the Practitioner through live audiovisual communication technology and the disclosure of certain medical information by electronic transmission. I acknowledge that I have been given the opportunity to request an in-person assessment or other available alternative prior to the telemedicine visit and am voluntarily participating in the telemedicine visit.  I understand that I have the right to withhold or withdraw my consent to the use of telemedicine in the course of my care at any time, without affecting my right to future care or treatment, and that the Practitioner or I may terminate the telemedicine visit at any time. I understand that I have the right to inspect all information obtained and/or recorded in the course of the telemedicine visit and may receive copies of available information for a reasonable fee.  I understand that some of the potential risks of receiving the Services via telemedicine include:  Delay or interruption in medical evaluation due to technological equipment failure or disruption; Information transmitted may not be  sufficient (e.g. poor resolution of images) to allow for appropriate medical decision making by the Practitioner; and/or  In rare instances, security protocols could fail, causing a breach of personal health information.  Furthermore, I acknowledge that it is my responsibility to provide information about my medical history, conditions and care that is complete and accurate to the best of my ability. I acknowledge that Practitioner's advice, recommendations, and/or decision may be based on factors not within their control, such as incomplete or inaccurate data provided by me or distortions of diagnostic images or specimens that may result from electronic transmissions. I understand that the practice of medicine is not an exact science and that Practitioner makes no warranties or guarantees regarding treatment outcomes. I acknowledge that a copy of this consent can be made available to me via my patient portal ([facility]), or I can request a printed copy by calling the office of [facility].    I understand that my insurance will be billed for this visit.   I have read or had this consent read to me. I understand the contents of this consent, which adequately explains the benefits and risks of the Services being provided via telemedicine.  I have been provided ample opportunity to ask questions regarding this consent and the Services and have had my questions answered to my satisfaction. I give my informed consent for the services to be provided through the use of telemedicine in my medical care

## 2022-09-19 NOTE — Telephone Encounter (Signed)
Pt has been scheduled a tele vsiit, 09/20/22 9:40.  Consent on file / medications reconciled.

## 2022-09-20 ENCOUNTER — Ambulatory Visit: Payer: Medicare Other | Attending: Cardiology | Admitting: Nurse Practitioner

## 2022-09-20 DIAGNOSIS — Z01810 Encounter for preprocedural cardiovascular examination: Secondary | ICD-10-CM

## 2022-09-20 NOTE — Progress Notes (Signed)
Virtual Visit via Telephone Note   Because of Shaun [name]'s co-morbid illnesses, he is at least at moderate risk for complications without adequate follow up.  This format is felt to be most appropriate for this patient at this time.  The patient did not have access to video technology/had technical difficulties with video requiring transitioning to audio format only (telephone).  All issues noted in this document were discussed and addressed.  No physical exam could be performed with this format.  Please refer to the patient's chart for his consent to telehealth for [facility].  Evaluation Performed:  Preoperative cardiovascular risk assessment _____________   Date:  09/20/2022   Patient ID:  Shaun [name], DOB Aug 21, 1956, MRN [id number] Patient Location:  Home Provider location:   Office  Primary Care Provider:  Orma Render, NP Primary Cardiologist:  Kirk Ruths, MD  Chief Complaint / Patient Profile   67 y.o. y/o male with a h/o permanent atrial fibrillation, CAD s/p DES-dLCx in 6314, chronic diastolic heart failure, hypertension, hyperlipidemia, and OSA who is pending hernia repair with Dr. Michaelle Birks of [facility] surgery and presents today for telephonic preoperative cardiovascular risk assessment.  History of Present Illness    Shaun [name] is a 67 y.o. male who presents via audio/video conferencing for a telehealth visit today.  Pt was last seen in cardiology clinic on 09/23/2021 by Laurann Montana, NP.  At that time Shaun [name] was doing well.  The patient is now pending procedure as outlined above. Since his last visit, he has done well from a cardiac standpoint.   He denies chest pain, palpitations, dyspnea, pnd, orthopnea, n, v, dizziness, syncope, edema, weight gain, or early satiety. All other systems reviewed and are otherwise negative except as noted above.   Past Medical History    Past Medical History:  Diagnosis Date    Anemia    Anxiety    Asthma    CAD (coronary artery disease)    DES to distal circumflex 2016   Cataract    Colon polyps    30 colon polyps found on first colonoscopy   Diastolic heart failure (HCC)    Diverticulitis    DJD (degenerative joint disease)    GERD (gastroesophageal reflux disease)    History of kidney stones    Hyperlipidemia    Hypertension    Insomnia    Obstructive sleep apnea 12/2009   01/26/2010 AHI 83/hr   Permanent atrial fibrillation ([place])    Onset 2006 paroxysmal then progressive to persistent   PUD (peptic ulcer disease)    1980s   RLS (restless legs syndrome)    Sinusitis    Skin cancer    Type 2 diabetes mellitus ([place])    Past Surgical History:  Procedure Laterality Date   BIOPSY  07/17/2018   Procedure: BIOPSY;  Surgeon: Danie Binder, MD;  [facility];  Service: Endoscopy;;  colon   BOWEL RESECTION  09/17/2018   SMALL BOWEL RESECTION: 71 CM    CARDIAC CATHETERIZATION N/A 07/21/2015   Procedure: Left Heart Cath and Coronary Angiography;  Surgeon: Peter M Martinique, MD;  Location: [facility] CV LAB;  Service: Cardiovascular;  Laterality: N/A;   CARDIAC CATHETERIZATION N/A 07/21/2015   Procedure: Coronary Stent Intervention;  Surgeon: Peter M Martinique, MD;  Location: [facility] LAB;  Service: Cardiovascular;  Laterality: N/A;   CIRCUMCISION N/A 04/05/2019   Procedure: CIRCUMCISION ADULT;  Surgeon: Irine Seal, MD;  Location:  [facility] OR[facility];  Service: Urology;  Laterality: N/A;   COLONOSCOPY N/A 05/19/2014   Dr. Barnie Alderman diverticulosis/moderate external hemorrhoids, >20 simple adenomas. Genetic screening negative.    COLONOSCOPY WITH PROPOFOL N/A 07/17/2018   Dr. Oneida Alar: Diverticulosis, external/internal hemorrhoids, 32 colon polyps removed.  ten tubular adenomas removed with no high-grade dysplasia.  Advised to have surveillance colonoscopy in 3 years.   COLONOSCOPY WITH PROPOFOL N/A 06/07/2021   Procedure: COLONOSCOPY WITH PROPOFOL;   Surgeon: Eloise Harman, DO;  [facility];  Service: Endoscopy;  Laterality: N/A;  9:30 / ASA 3  (Pt was told that his time will be given at Pre-op)   ESOPHAGOGASTRODUODENOSCOPY (EGD) WITH PROPOFOL N/A 07/17/2018   Dr. Oneida Alar: Low-grade narrowing Schatzki ring at the GE junction status post dilation.  Gastritis.  Biopsy with mild nonspecific reactive gastropathy.  No H. pylori.   GIVENS CAPSULE STUDY N/A 06/24/2019   normal   HERNIA REPAIR  1986   Left inguinal   INTRAVASCULAR PRESSURE WIRE/FFR STUDY Left 06/08/2017   Procedure: INTRAVASCULAR PRESSURE WIRE/FFR STUDY;  Surgeon: Nelva Bush, MD;  Location: [facility] CV LAB;  Service: Cardiovascular;  Laterality: Left;  LAD and CFX   LAPAROTOMY N/A 09/17/2018   Procedure: EXPLORATORY LAPAROTOMY;  Surgeon: Virl Cagey, MD;  Location: [facility];  Service: General;  Laterality: N/A;   LEFT HEART CATH AND CORONARY ANGIOGRAPHY N/A 06/08/2017   Procedure: LEFT HEART CATH AND CORONARY ANGIOGRAPHY;  Surgeon: Nelva Bush, MD;  Location: [facility] CV LAB;  Service: Cardiovascular;  Laterality: N/A;   POLYPECTOMY  07/17/2018   Procedure: POLYPECTOMY;  Surgeon: Danie Binder, MD;  [facility];  Service: Endoscopy;;  colon   POLYPECTOMY  06/07/2021   Procedure: POLYPECTOMY INTESTINAL;  Surgeon: Eloise Harman, DO;  [facility];  Service: Endoscopy;;   ROTATOR CUFF REPAIR     Right   SAVORY DILATION N/A 07/17/2018   Procedure: SAVORY DILATION;  Surgeon: Danie Binder, MD;  [facility];  Service: Endoscopy;  Laterality: N/A;    Allergies  No Known Allergies  Home Medications    Prior to Admission medications   Medication Sig Start Date End Date Taking? Authorizing Provider  acetaminophen (TYLENOL) 325 MG tablet Take 2 tablets (650 mg total) by mouth every 6 (six) hours as needed for mild pain. 08/30/21   Love, Ivan Anchors, PA-C  albuterol (VENTOLIN HFA) 108 (90 Base) MCG/ACT inhaler  INHALE 2 PUFFS INTO THE LUNGS EVERY 6 HOURS AS NEEDED FOR WHEEZING OR SHORTNESS OF BREATH 08/01/21   Hawks, Alyse Low A, FNP  apixaban (ELIQUIS) 5 MG TABS tablet TAKE 1 TABLET (5 MG) BY MOUTH TWICE DAILY 02/14/22   Lelon Perla, MD  atorvastatin (LIPITOR) 80 MG tablet Take 1 tablet (80 mg total) by mouth daily. TAKE 1 TABLET(80 MG) BY MOUTH EVERY EVENING 06/23/22   Lelon Perla, MD  azelastine (ASTELIN) 0.1 % nasal spray Place 1 spray into both nostrils 2 (two) times daily. Use in each nostril as directed 10/12/21   Maximiano Coss, NP  buPROPion (WELLBUTRIN XL) 150 MG 24 hr tablet Take 3 tablets once daily 07/29/22   Early, Coralee Pesa, NP  docusate sodium (COLACE) 100 MG capsule Take 1 capsule (100 mg total) by mouth daily as needed for mild constipation. 07/29/22   Orma Render, NP  ezetimibe (ZETIA) 10 MG tablet Take 1 tablet (10 mg total) by mouth daily. 07/28/22 07/28/23  Lelon Perla, MD  ferrous  sulfate 325 (65 FE) MG tablet Take 325 mg by mouth daily with breakfast.    [provider]  furosemide (LASIX) 40 MG tablet Take 1 tablet (40 mg total) by mouth daily. 05/20/22   Loel Dubonnet, NP  HYDROcodone-acetaminophen (NORCO/VICODIN) 5-325 MG tablet Take 2 tablets by mouth 2 (two) times daily as needed for moderate pain. 07/29/22   Orma Render, NP  magnesium 30 MG tablet Take 30 mg by mouth daily at 6 (six) AM.    [provider]  metFORMIN (GLUCOPHAGE) 500 MG tablet Take 1 tablet (500 mg total) by mouth 2 (two) times daily before a meal. 07/29/22   Early, Coralee Pesa, NP  Metoprolol Tartrate 75 MG TABS Take 75 mg by mouth 2 (two) times daily. 07/29/22   Orma Render, NP  nitroGLYCERIN (NITROSTAT) 0.4 MG SL tablet Place 1 tablet (0.4 mg total) under the tongue every 5 (five) minutes as needed. 07/29/22   Orma Render, NP  omeprazole (PRILOSEC) 40 MG capsule Take 1 capsule by mouth once daily 30 minutes before breakfast 01/31/22   Annitta Needs, NP  potassium chloride SA  (KLOR-CON M) 20 MEQ tablet Take 1 tablet (20 mEq total) by mouth daily. 05/05/22   Lelon Perla, MD  rOPINIRole (REQUIP) 3 MG tablet Take 1 tab nightly for RLS and a 1/2 tab during the day to control breakthrough symptoms 07/29/22   Early, Coralee Pesa, NP  sildenafil (VIAGRA) 100 MG tablet TAKE 1 TABLET BY MOUTH 30 MINUTES BEFORE ACTIVITY 09/02/22   Early, Coralee Pesa, NP  sucralfate (CARAFATE) 1 g tablet Take one tablet po BID PRN Patient taking differently: Take 1 g by mouth 2 (two) times daily as needed (acid reflux). 01/08/20   Mikey Kirschner, MD  tiZANidine (ZANAFLEX) 4 MG tablet Take 1 tablet (4 mg total) by mouth 2 (two) times daily as needed for muscle spasms. 07/29/22   Orma Render, NP  traZODone (DESYREL) 50 MG tablet TAKE 1 TABLET BY MOUTH EVERY NIGHT AT BEDTIME FOR 7 DAYS THEN TAKE 2 TABLETS BY MOUTH EVERY NIGHT AT BEDTIME 06/27/22   Early, Coralee Pesa, NP  triamcinolone ointment (KENALOG) 0.1 % Apply 1 application. topically 2 (two) times daily. 01/13/22 01/13/23  Midge Minium, MD    Physical Exam    Vital Signs:  Shaun [name] does not have vital signs available for review today.  Given telephonic nature of communication, physical exam is limited. AAOx3. NAD. Normal affect.  Speech and respirations are unlabored.  Accessory Clinical Findings    None  Assessment & Plan    1.  Preoperative Cardiovascular Risk Assessment:  According to the Revised Cardiac Risk Index (RCRI), his Perioperative Risk of Major Cardiac Event is (%): 6.6. His Functional Capacity in METs is: 5.72 according to the Duke Activity Status Index (DASI). Therefore, based on ACC/AHA guidelines, patient would be at acceptable risk for the planned procedure without further cardiovascular testing.  The patient was advised that if he develops new symptoms prior to surgery to contact our office to arrange for a follow-up visit, and he verbalized understanding.  Per office protocol, patient can hold Eliquis for 2  days prior to procedure.  Please resume Eliquis as soon as possible postprocedure, at the discretion of the surgeon.  A copy of this note will be routed to requesting surgeon.  Time:   Today, I have spent 5  minutes with the patient with telehealth technology discussing medical history, symptoms,  and management plan.     Lenna Sciara, NP  09/20/2022, 10:00 AM

## 2022-10-04 ENCOUNTER — Other Ambulatory Visit: Payer: Self-pay | Admitting: Nurse Practitioner

## 2022-10-04 DIAGNOSIS — M2550 Pain in unspecified joint: Secondary | ICD-10-CM

## 2022-10-04 DIAGNOSIS — N471 Phimosis: Secondary | ICD-10-CM

## 2022-10-05 ENCOUNTER — Other Ambulatory Visit (HOSPITAL_BASED_OUTPATIENT_CLINIC_OR_DEPARTMENT_OTHER): Payer: Self-pay

## 2022-10-05 ENCOUNTER — Other Ambulatory Visit: Payer: Self-pay

## 2022-10-05 MED ORDER — TIZANIDINE HCL 4 MG PO TABS
4.0000 mg | ORAL_TABLET | Freq: Two times a day (BID) | ORAL | 0 refills | Status: DC | PRN
  Filled 2022-10-05: qty 60, 30d supply, fill #0

## 2022-10-05 MED ORDER — SILDENAFIL CITRATE 100 MG PO TABS
ORAL_TABLET | ORAL | 0 refills | Status: DC
  Filled 2022-10-05: qty 8, 8d supply, fill #0

## 2022-10-06 ENCOUNTER — Other Ambulatory Visit (HOSPITAL_BASED_OUTPATIENT_CLINIC_OR_DEPARTMENT_OTHER): Payer: Self-pay

## 2022-10-13 NOTE — Pre-Procedure Instructions (Signed)
Surgical Instructions    Your procedure is scheduled on Wednesday, February 21.  Report to [facility] Main Entrance "A" at 6:30 A.M., then check in with the Admitting office.  Call this number if you have problems the morning of surgery:  [phone]   If you have any questions prior to your surgery date call [phone]: Open Monday-Friday 8am-4pm If you experience any cold or flu symptoms such as cough, fever, chills, shortness of breath, etc. between now and your scheduled surgery, please notify us at the above number     Remember:  Do not eat after midnight the night before your surgery  You may drink clear liquids until 5:30AM the morning of your surgery.   Clear liquids allowed are: Water, Non-Citrus Juices (without pulp), Carbonated Beverages, Clear Tea, Black Coffee ONLY (NO MILK, CREAM OR POWDERED CREAMER of any kind), and Gatorade    Take these medicines the morning of surgery with A SIP OF WATER:  buPROPion (WELLBUTRIN XL)  ezetimibe (ZETIA)  azelastine (ASTELIN) 0.1 % nasal spray  Metoprolol Tartrate  omeprazole (PRILOSEC)   IF NEEDED: acetaminophen (TYLENOL) HYDROcodone-acetaminophen (NORCO/VICODIN)  albuterol (VENTOLIN HFA) 108 (90 Base) MCG/ACT inhaler   Follow your surgeon's instructions on when to stop Eliquis.  If no instructions were given by your surgeon then you will need to call the office to get those instructions.    As of today, STOP taking any Aspirin (unless otherwise instructed by your surgeon) Aleve, Naproxen, Ibuprofen, Motrin, Advil, Goody's, BC's, all herbal medications, fish oil, and all vitamins.             [facility] is not responsible for any belongings or valuables.    Do NOT Smoke (Tobacco/Vaping)  24 hours prior to your procedure  If you use a CPAP at night, you may bring your mask for your overnight stay.   Contacts, glasses, hearing aids, dentures or partials may not be worn into surgery, please bring cases for these belongings    For patients admitted to the hospital, discharge time will be determined by your treatment team.   Patients discharged the day of surgery will not be allowed to drive home, and someone needs to stay with them for 24 hours.   SURGICAL WAITING ROOM VISITATION Patients having surgery or a procedure may have no more than 2 support people in the waiting area - these visitors may rotate.   Children under the age of 74 must have an adult with them who is not the patient. If the patient needs to stay at the hospital during part of their recovery, the visitor guidelines for inpatient rooms apply. Pre-op nurse will coordinate an appropriate time for 1 support person to accompany patient in pre-op.  This support person may not rotate.   Please refer to [email or web address] for the visitor guidelines for Inpatients (after your surgery is over and you are in a regular room).    Special instructions:    Oral Hygiene is also important to reduce your risk of infection.  Remember - BRUSH YOUR TEETH THE MORNING OF SURGERY WITH YOUR REGULAR TOOTHPASTE   [facility]- Preparing For Surgery  Before surgery, you can play an important role. Because skin is not sterile, your skin needs to be as free of germs as possible. You can reduce the number of germs on your skin by washing with CHG (chlorahexidine gluconate) Soap before surgery.  CHG is an antiseptic cleaner which kills germs and bonds with the skin to continue killing germs  even after washing.     Please do not use if you have an allergy to CHG or antibacterial soaps. If your skin becomes reddened/irritated stop using the CHG.  Do not shave (including legs and underarms) for at least 48 hours prior to first CHG shower. It is OK to shave your face.  Please follow these instructions carefully.     Shower the NIGHT BEFORE SURGERY and the MORNING OF SURGERY with CHG Soap.   If you chose to wash your hair,  wash your hair first as usual with your normal shampoo. After you shampoo, rinse your hair and body thoroughly to remove the shampoo.  Then [employer] and genitals (private parts) with your normal soap and rinse thoroughly to remove soap.  After that Use CHG Soap as you would any other liquid soap. You can apply CHG directly to the skin and wash gently with a scrungie or a clean washcloth.   Apply the CHG Soap to your body ONLY FROM THE NECK DOWN.  Do not use on open wounds or open sores. Avoid contact with your eyes, ears, mouth and genitals (private parts). Wash Face and genitals (private parts)  with your normal soap.   Wash thoroughly, paying special attention to the area where your surgery will be performed.  Thoroughly rinse your body with warm water from the neck down.  DO NOT shower/wash with your normal soap after using and rinsing off the CHG Soap.  Pat yourself dry with a CLEAN TOWEL.  Wear CLEAN PAJAMAS to bed the night before surgery  Place CLEAN SHEETS on your bed the night before your surgery  DO NOT SLEEP WITH PETS.   Day of Surgery:  Take a shower with CHG soap. Wear Clean/Comfortable clothing the morning of surgery Do not wear jewelry or makeup. Do not wear lotions, powders, perfumes/cologne or deodorant. Do not shave 48 hours prior to surgery.  Men may shave face and neck. Do not bring valuables to the hospital. Do not wear nail polish, gel polish, artificial nails, or any other type of covering on natural nails (fingers and toes) If you have artificial nails or gel coating that need to be removed by a nail salon, please have this removed prior to surgery. Artificial nails or gel coating may interfere with anesthesia's ability to adequately monitor your vital signs. Remember to brush your teeth WITH YOUR REGULAR TOOTHPASTE.    If you received a COVID test during your pre-op visit, it is requested that you wear a mask when out in public, stay away from anyone that  may not be feeling well, and notify your surgeon if you develop symptoms. If you have been in contact with anyone that has tested positive in the last 10 days, please notify your surgeon.    Please read over the following fact sheets that you were given.

## 2022-10-14 ENCOUNTER — Encounter (HOSPITAL_COMMUNITY): Payer: Self-pay

## 2022-10-14 ENCOUNTER — Encounter (HOSPITAL_COMMUNITY)
Admission: RE | Admit: 2022-10-14 | Discharge: 2022-10-14 | Disposition: A | Discharge status: Home / Self Care | Payer: Medicare Other | Source: Ambulatory Visit | Attending: Surgery | Admitting: Surgery

## 2022-10-14 ENCOUNTER — Other Ambulatory Visit: Payer: Self-pay

## 2022-10-14 VITALS — BP 138/92 | HR 75 | Temp 97.7°F | Resp 19 | Ht 68.0 in | Wt 222.8 lb

## 2022-10-14 DIAGNOSIS — E785 Hyperlipidemia, unspecified: Secondary | ICD-10-CM | POA: Diagnosis not present

## 2022-10-14 DIAGNOSIS — I110 Hypertensive heart disease with heart failure: Secondary | ICD-10-CM | POA: Diagnosis not present

## 2022-10-14 DIAGNOSIS — I5032 Chronic diastolic (congestive) heart failure: Secondary | ICD-10-CM | POA: Insufficient documentation

## 2022-10-14 DIAGNOSIS — G4733 Obstructive sleep apnea (adult) (pediatric): Secondary | ICD-10-CM | POA: Insufficient documentation

## 2022-10-14 DIAGNOSIS — E119 Type 2 diabetes mellitus without complications: Secondary | ICD-10-CM | POA: Insufficient documentation

## 2022-10-14 DIAGNOSIS — I2510 Atherosclerotic heart disease of native coronary artery without angina pectoris: Secondary | ICD-10-CM | POA: Insufficient documentation

## 2022-10-14 DIAGNOSIS — I4821 Permanent atrial fibrillation: Secondary | ICD-10-CM | POA: Insufficient documentation

## 2022-10-14 DIAGNOSIS — Z955 Presence of coronary angioplasty implant and graft: Secondary | ICD-10-CM | POA: Insufficient documentation

## 2022-10-14 DIAGNOSIS — Z01812 Encounter for preprocedural laboratory examination: Secondary | ICD-10-CM | POA: Diagnosis not present

## 2022-10-14 DIAGNOSIS — Z01818 Encounter for other preprocedural examination: Secondary | ICD-10-CM

## 2022-10-14 HISTORY — DX: Dyspnea, unspecified: R06.00

## 2022-10-14 HISTORY — DX: Depression, unspecified: F32.A

## 2022-10-14 HISTORY — DX: Angina pectoris, unspecified: I20.9

## 2022-10-14 HISTORY — DX: Cardiac arrhythmia, unspecified: I49.9

## 2022-10-14 HISTORY — DX: Pneumonia, unspecified organism: J18.9

## 2022-10-14 LAB — CBC
HCT: 40.9 % (ref 39.0–52.0)
Hemoglobin: 13.3 g/dL (ref 13.0–17.0)
MCH: 28.1 pg (ref 26.0–34.0)
MCHC: 32.5 g/dL (ref 30.0–36.0)
MCV: 86.3 fL (ref 80.0–100.0)
Platelets: 179 10*3/uL (ref 150–400)
RBC: 4.74 MIL/uL (ref 4.22–5.81)
RDW: 13.7 % (ref 11.5–15.5)
WBC: 7 10*3/uL (ref 4.0–10.5)
nRBC: 0 % (ref 0.0–0.2)

## 2022-10-14 LAB — BASIC METABOLIC PANEL
Anion gap: 11 (ref 5–15)
BUN: 17 mg/dL (ref 8–23)
CO2: 22 mmol/L (ref 22–32)
Calcium: 8.6 mg/dL — ABNORMAL LOW (ref 8.9–10.3)
Chloride: 105 mmol/L (ref 98–111)
Creatinine, Ser: 0.96 mg/dL (ref 0.61–1.24)
GFR, Estimated: >60 mL/min (ref >60)
Glucose, Bld: 122 mg/dL — ABNORMAL HIGH (ref 70–99)
Potassium: 4.2 mmol/L (ref 3.5–5.1)
Sodium: 138 mmol/L (ref 135–145)

## 2022-10-14 LAB — HEMOGLOBIN A1C
Hgb A1c MFr Bld: 6.4 % — ABNORMAL HIGH (ref 4.8–5.6)
Mean Plasma Glucose: 136.98 mg/dL

## 2022-10-14 LAB — SURGICAL PCR SCREEN
MRSA, PCR: NEGATIVE
Staphylococcus aureus: NEGATIVE

## 2022-10-14 LAB — GLUCOSE, CAPILLARY: Glucose-Capillary: 139 mg/dL — ABNORMAL HIGH (ref 70–99)

## 2022-10-14 NOTE — Pre-Procedure Instructions (Addendum)
Surgical Instructions  Call Center for your medications [phone]  Please call them today, 10/14/2022 when you get home.     Your procedure is scheduled on Wednesday, February 21.  Report to [facility] Main Entrance "A" at 6:30 A.M., then check in with the Admitting office.  Call this number if you have problems the morning of surgery:  [phone]   If you have any questions prior to your surgery date call [phone]: Open Monday-Friday 8am-4pm If you experience any cold or flu symptoms such as cough, fever, chills, shortness of breath, etc. between now and your scheduled surgery, please notify us at the above number     Remember:  Do not eat after midnight the night before your surgery  You may drink clear liquids until 5:30AM the morning of your surgery.   Clear liquids allowed are: Water, Non-Citrus Juices (without pulp), Carbonated Beverages, Clear Tea, Black Coffee ONLY (NO MILK, CREAM OR POWDERED CREAMER of any kind), and Gatorade     Take these medicines the morning of surgery with A SIP OF WATER:  buPROPion (WELLBUTRIN XL)  ezetimibe (ZETIA)  azelastine (ASTELIN) 0.1 % nasal spray  Metoprolol Tartrate  omeprazole (PRILOSEC)   IF NEEDED: acetaminophen (TYLENOL) HYDROcodone-acetaminophen (NORCO/VICODIN)  albuterol (VENTOLIN HFA) 108 (90 Base) MCG/ACT inhaler   Follow your surgeon's instructions on when to stop Eliquis.  If no instructions were given by your surgeon then you will need to call the office to get those instructions.    As of today, STOP taking any Aspirin (unless otherwise instructed by your surgeon) Aleve, Naproxen, Ibuprofen, Motrin, Advil, Goody's, BC's, all herbal medications, fish oil, and all vitamins.   WHAT DO I DO ABOUT MY DIABETES MEDICATION?   Do not take oral diabetes medicines (pills) the morning of surgery. Do NOT take your Metformin the day of surgery   The day of surgery, do not take other diabetes injectables, including Byetta  (exenatide), Bydureon (exenatide ER), Victoza (liraglutide), or Trulicity (dulaglutide).  If your CBG is greater than 220 mg/dL, you may take  of your sliding scale (correction) dose of insulin.   HOW TO MANAGE YOUR DIABETES BEFORE AND AFTER SURGERY  Why is it important to control my blood sugar before and after surgery? Improving blood sugar levels before and after surgery helps healing and can limit problems. A way of improving blood sugar control is eating a healthy diet by:  Eating less sugar and carbohydrates  Increasing activity/exercise  Talking with your doctor about reaching your blood sugar goals High blood sugars (greater than 180 mg/dL) can raise your risk of infections and slow your recovery, so you will need to focus on controlling your diabetes during the weeks before surgery. Make sure that the doctor who takes care of your diabetes knows about your planned surgery including the date and location.  How do I manage my blood sugar before surgery? Check your blood sugar at least 4 times a day, starting 2 days before surgery, to make sure that the level is not too high or low.  Check your blood sugar the morning of your surgery when you wake up and every 2 hours until you get to the Short Stay unit.  If your blood sugar is less than 70 mg/dL, you will need to treat for low blood sugar: Do not take insulin. Treat a low blood sugar (less than 70 mg/dL) with  cup of clear juice (cranberry or apple), 4 glucose tablets, OR glucose gel. Recheck blood sugar in  15 minutes after treatment (to make sure it is greater than 70 mg/dL). If your blood sugar is not greater than 70 mg/dL on recheck, call [phone] for further instructions. Report your blood sugar to the short stay nurse when you get to Short Stay.  If you are admitted to the hospital after surgery: Your blood sugar will be checked by the staff and you will probably be given insulin after surgery (instead of oral diabetes  medicines) to make sure you have good blood sugar levels. The goal for blood sugar control after surgery is 80-180 mg/dL.          [facility] is not responsible for any belongings or valuables.    Do NOT Smoke (Tobacco/Vaping)  24 hours prior to your procedure  If you use a CPAP at night, you may bring your mask for your overnight stay.   Contacts, glasses, hearing aids, dentures or partials may not be worn into surgery, please bring cases for these belongings   For patients admitted to the hospital, discharge time will be determined by your treatment team.   Patients discharged the day of surgery will not be allowed to drive home, and someone needs to stay with them for 24 hours.   SURGICAL WAITING ROOM VISITATION Patients having surgery or a procedure may have no more than 2 support people in the waiting area - these visitors may rotate.   Children under the age of 67 must have an adult with them who is not the patient. If the patient needs to stay at the hospital during part of their recovery, the visitor guidelines for inpatient rooms apply. Pre-op nurse will coordinate an appropriate time for 1 support person to accompany patient in pre-op.  This support person may not rotate.   Please refer to [email or web address] for the visitor guidelines for Inpatients (after your surgery is over and you are in a regular room).    Special instructions:    Oral Hygiene is also important to reduce your risk of infection.  Remember - BRUSH YOUR TEETH THE MORNING OF SURGERY WITH YOUR REGULAR TOOTHPASTE   [facility]- Preparing For Surgery  Before surgery, you can play an important role. Because skin is not sterile, your skin needs to be as free of germs as possible. You can reduce the number of germs on your skin by washing with CHG (chlorahexidine gluconate) Soap before surgery.  CHG is an antiseptic cleaner which kills germs and bonds with the  skin to continue killing germs even after washing.     Please do not use if you have an allergy to CHG or antibacterial soaps. If your skin becomes reddened/irritated stop using the CHG.  Do not shave (including legs and underarms) for at least 48 hours prior to first CHG shower. It is OK to shave your face.  Please follow these instructions carefully.     Shower the NIGHT BEFORE SURGERY and the MORNING OF SURGERY with CHG Soap.   If you chose to wash your hair, wash your hair first as usual with your normal shampoo. After you shampoo, rinse your hair and body thoroughly to remove the shampoo.  Then [employer] and genitals (private parts) with your normal soap and rinse thoroughly to remove soap.  After that Use CHG Soap as you would any other liquid soap. You can apply CHG directly to the skin and wash gently with a scrungie or a clean washcloth.   Apply the CHG Soap to your body ONLY  FROM THE NECK DOWN.  Do not use on open wounds or open sores. Avoid contact with your eyes, ears, mouth and genitals (private parts). Wash Face and genitals (private parts)  with your normal soap.   Wash thoroughly, paying special attention to the area where your surgery will be performed.  Thoroughly rinse your body with warm water from the neck down.  DO NOT shower/wash with your normal soap after using and rinsing off the CHG Soap.  Pat yourself dry with a CLEAN TOWEL.  Wear CLEAN PAJAMAS to bed the night before surgery  Place CLEAN SHEETS on your bed the night before your surgery  DO NOT SLEEP WITH PETS.   Day of Surgery:  Take a shower with CHG soap. Wear Clean/Comfortable clothing the morning of surgery Do not wear jewelry or makeup. Do not wear lotions, powders, perfumes/cologne or deodorant. Do not shave 48 hours prior to surgery.  Men may shave face and neck. Do not bring valuables to the hospital. Do not wear nail polish, gel polish, artificial nails, or any other type of covering on  natural nails (fingers and toes) If you have artificial nails or gel coating that need to be removed by a nail salon, please have this removed prior to surgery. Artificial nails or gel coating may interfere with anesthesia's ability to adequately monitor your vital signs. Remember to brush your teeth WITH YOUR REGULAR TOOTHPASTE.    If you received a COVID test during your pre-op visit, it is requested that you wear a mask when out in public, stay away from anyone that may not be feeling well, and notify your surgeon if you develop symptoms. If you have been in contact with anyone that has tested positive in the last 10 days, please notify your surgeon.    Please read over the following fact sheets that you were given.

## 2022-10-14 NOTE — Progress Notes (Signed)
PCP - Dr. Laretta Bolster Early Cardiologist - Dr. Demaris Callander  PPM/ICD - Denies  Chest x-ray - States last year EKG - 02/25/22 Stress Test - 07/09/2015 ECHO - 08/10/2021 Cardiac Cath - 06/08/2017  Sleep Study - Yes, diagnosed with sleep apnea CPAP - Wears it every night, does not know settings  Type II diabetic Fasting Blood Sugar - 125-135 Checks Blood Sugar __2___ times a day  Last dose of GLP1 agonist-  Denies GLP1 instructions: n/a  Blood Thinner Instructions: Patient on Eliquis, told to stop taking 2 days prior to surgery Aspirin Instructions:Denies  ERAS Protcol - Yes, clear liquids PRE-SURGERY Ensure or G2- No  COVID TEST- n/a   Anesthesia review: Yes, DM, CAD, HTN, hx stents. Patient was cleared by Cardiologist via phone directly to Dr Ayesha Rumpf office.   Patient denies shortness of breath, fever, cough and chest pain at PAT appointment   All instructions explained to the patient, with a verbal understanding of the material. Patient agrees to go over the instructions while at home for a better understanding. Patient also instructed to self quarantine after being tested for COVID-19. The opportunity to ask questions was provided.

## 2022-10-17 NOTE — Progress Notes (Signed)
Anesthesia Chart Review:  Follows with cardiology for history of permanent atrial fibrillation, CAD s/p DES-dLCx in Q000111Q, chronic diastolic heart failure, hypertension, hyperlipidemia, and OSA on CPAP.  Last seen by Diona Browner, NP on 09/20/2022 for preop evaluation.  Per note, "According to the Revised Cardiac Risk Index (RCRI), his Perioperative Risk of Major Cardiac Event is (%): 6.6. His Functional Capacity in METs is: 5.72 according to the Duke Activity Status Index (DASI). Therefore, based on ACC/AHA guidelines, patient would be at acceptable risk for the planned procedure without further cardiovascular testing. The patient was advised that if he develops new symptoms prior to surgery to contact our office to arrange for a follow-up visit, and he verbalized understanding. Per office protocol, patient can hold Eliquis for 2 days prior to procedure.  Please resume Eliquis as soon as possible postprocedure, at the discretion of the surgeon."  Non-insulin-dependent DM2, A1c 6.4 on preop labs.  Labs reviewed, unremarkable.  EKG 02/25/2022: Atrial fibrillation.  Ventricular rate 79.  TTE 08/10/2021:  1. Left ventricular ejection fraction, by estimation, is 60 to 65%. The  left ventricle has normal function. The left ventricle has no regional  wall motion abnormalities. There is moderate concentric left ventricular  hypertrophy. Left ventricular  diastolic parameters are indeterminate.   2. Right ventricular systolic function is normal. The right ventricular  size is normal. Tricuspid regurgitation signal is inadequate for assessing  PA pressure.   3. Left atrial size was mildly dilated.   4. The mitral valve is grossly normal. Trivial mitral valve  regurgitation.   5. The aortic valve is tricuspid. Aortic valve regurgitation is not  visualized.   6. Unable to estimate CVP.   Comparison(s): Prior images reviewed side by side. LVEF stable at 60-65%.   Cath 06/08/2017: Dist Cx lesion, 30  %stenosed.   Conclusions: Mild to moderate non-obstructive coronary artery disease, as detailed below. LAD and LCx disease is not hemodynamically significant by FFR. Patent stent in the distal LCx. Normal left ventricular contraction and filling pressure. Severe pain at the end of diagnostic catheterization without clear etiology. EKG's were unchanged from baseline, and pain resolved after the patient was able to sit up.   Recommendations: Medical management and secondary prevention. Consider evaluation for non-cardiac causes of chest pain.   Wynonia Musty [facility] Short Stay Center/Anesthesiology Phone [phone] 10/17/2022 1:25 PM

## 2022-10-17 NOTE — Anesthesia Preprocedure Evaluation (Signed)
Anesthesia Evaluation  Patient identified by MRN, date of birth, ID band Patient awake    Reviewed: Allergy & Precautions, NPO status , Patient's Chart, lab work & pertinent test results, reviewed documented beta blocker date and time   Airway Mallampati: III  TM Distance: >3 FB Neck ROM: Full    Dental  (+) Poor Dentition, Missing, Dental Advisory Given   Pulmonary shortness of breath and with exertion, asthma , sleep apnea and Continuous Positive Airway Pressure Ventilation , pneumonia, resolved, COPD,  COPD inhaler, Current Smoker and Patient abstained from smoking.   breath sounds clear to auscultation + decreased breath sounds      Cardiovascular hypertension, Pt. on medications and Pt. on home beta blockers + angina with exertion + CAD, + Cardiac Stents and +CHF  Normal cardiovascular exam+ dysrhythmias Atrial Fibrillation  Rhythm:Regular Rate:Normal  EKG 02/25/22 Atrial fibrillation  Echo 08/10/21 1. Left ventricular ejection fraction, by estimation, is 60 to 65%. The  left ventricle has normal function. The left ventricle has no regional  wall motion abnormalities. There is moderate concentric left ventricular  hypertrophy. Left ventricular  diastolic parameters are indeterminate.   2. Right ventricular systolic function is normal. The right ventricular  size is normal. Tricuspid regurgitation signal is inadequate for assessing  PA pressure.   3. Left atrial size was mildly dilated.   4. The mitral valve is grossly normal. Trivial mitral valve  regurgitation.   5. The aortic valve is tricuspid. Aortic valve regurgitation is not  visualized.   6. Unable to estimate CVP.   Cardiac Cath 06/08/2017  Dist Cx lesion, 30 %stenosed.   Conclusions: 1. Mild to moderate non-obstructive coronary artery disease, as detailed below. LAD and LCx disease is not hemodynamically significant by FFR. 2. Patent stent in the distal  LCx. 3. Normal left ventricular contraction and filling pressure. 4. Severe pain at the end of diagnostic catheterization without clear etiology. EKG's were unchanged from baseline, and pain resolved after the patient was able to sit up.   Recommendations: 1. Medical management and secondary prevention. 2. Consider evaluation for non-cardiac causes of chest pain.      Neuro/Psych  PSYCHIATRIC DISORDERS Anxiety Depression    Restless legs syndrome    GI/Hepatic Neg liver ROS, PUD,GERD  Medicated,,  Endo/Other  diabetes, Well Controlled, Type 2, Oral Hypoglycemic Agents  Hyperlipidemia  Renal/GU negative Renal ROS  negative genitourinary   Musculoskeletal  (+) Arthritis , Osteoarthritis,  Incisional hernia   Abdominal  (+) + obese  Peds  Hematology  (+) Blood dyscrasia, anemia Eliquis therapy   Anesthesia Other Findings   Reproductive/Obstetrics ED                             Anesthesia Physical Anesthesia Plan  ASA: 3  Anesthesia Plan: General   Post-op Pain Management: Tylenol PO (pre-op)*, Precedex and Dilaudid IV   Induction: Intravenous  PONV Risk Score and Plan: 3 and Treatment may vary due to age or medical condition and Ondansetron  Airway Management Planned: LMA and Oral ETT  Additional Equipment: None  Intra-op Plan:   Post-operative Plan: Extubation in OR  Informed Consent:   Plan Discussed with:   Anesthesia Plan Comments: (PAT note by Karoline Caldwell, PA-C: Follows with cardiology for history of permanent atrial fibrillation, CADs/pDES-dLCx in Q000111Q, chronic diastolic heart failure, hypertension, hyperlipidemia, and OSAon CPAP.  Last seen by Diona Browner, NP on 09/20/2022 for preop evaluation.  Per  note, "According to the Revised Cardiac Risk Index (RCRI),hisPerioperative Risk of Major Cardiac Event is (%): 6.6.HisFunctional Capacity in METs is: 5.72according to the Duke Activity Status Index (DASI). Therefore, based on  ACC/AHA guidelines, patient would be at acceptable risk for the planned procedure without further cardiovascular testing. The patient was advised that ifhedevelops new symptoms prior to surgery to contact our office to arrange for a follow-up visit, and heverbalized understanding. Per office protocol, patient can holdEliquisfor2days prior to procedure.Please resume Eliquis as soon as possible postprocedure, at the discretion of the surgeon."  Non-insulin-dependent DM2, A1c 6.4 on preop labs.  Labs reviewed, unremarkable.  EKG 02/25/2022: Atrial fibrillation.  Ventricular rate 79.  TTE 08/10/2021: 1. Left ventricular ejection fraction, by estimation, is 60 to 65%. The  left ventricle has normal function. The left ventricle has no regional  wall motion abnormalities. There is moderate concentric left ventricular  hypertrophy. Left ventricular  diastolic parameters are indeterminate.  2. Right ventricular systolic function is normal. The right ventricular  size is normal. Tricuspid regurgitation signal is inadequate for assessing  PA pressure.  3. Left atrial size was mildly dilated.  4. The mitral valve is grossly normal. Trivial mitral valve  regurgitation.  5. The aortic valve is tricuspid. Aortic valve regurgitation is not  visualized.  6. Unable to estimate CVP.   Comparison(s): Prior images reviewed side by side. LVEF stable at 60-65%.   Cath 06/08/2017: ? Dist Cx lesion, 30 %stenosed.  Conclusions: 1. Mild to moderate non-obstructive coronary artery disease, as detailed below. LAD and LCx disease is not hemodynamically significant by FFR. 2. Patent stent in the distal LCx. 3. Normal left ventricular contraction and filling pressure. 4. Severe pain at the end of diagnostic catheterization without clear etiology. EKG's were unchanged from baseline, and pain resolved after the patient was able to sit up.  Recommendations: 1. Medical management and secondary  prevention. 2. Consider evaluation for non-cardiac causes of chest pain.   )        Anesthesia Quick Evaluation

## 2022-10-18 ENCOUNTER — Encounter (HOSPITAL_COMMUNITY): Payer: Self-pay | Admitting: Surgery

## 2022-10-19 ENCOUNTER — Encounter (HOSPITAL_COMMUNITY)
Admission: RE | Disposition: A | Discharge status: Home / Self Care | Payer: Self-pay | Source: Home / Self Care | Attending: Surgery

## 2022-10-19 ENCOUNTER — Ambulatory Visit (HOSPITAL_COMMUNITY)
Admission: RE | Admit: 2022-10-19 | Discharge: 2022-10-20 | Disposition: A | Discharge status: Home / Self Care | Payer: Medicare Other | Attending: Surgery | Admitting: Surgery

## 2022-10-19 ENCOUNTER — Encounter (HOSPITAL_COMMUNITY): Payer: Self-pay | Admitting: Surgery

## 2022-10-19 ENCOUNTER — Other Ambulatory Visit: Payer: Self-pay

## 2022-10-19 ENCOUNTER — Ambulatory Visit (HOSPITAL_BASED_OUTPATIENT_CLINIC_OR_DEPARTMENT_OTHER): Payer: Medicare Other | Admitting: Anesthesiology

## 2022-10-19 ENCOUNTER — Ambulatory Visit (HOSPITAL_COMMUNITY): Payer: Medicare Other | Admitting: Physician Assistant

## 2022-10-19 DIAGNOSIS — F17200 Nicotine dependence, unspecified, uncomplicated: Secondary | ICD-10-CM | POA: Diagnosis not present

## 2022-10-19 DIAGNOSIS — K430 Incisional hernia with obstruction, without gangrene: Secondary | ICD-10-CM | POA: Diagnosis not present

## 2022-10-19 DIAGNOSIS — J45909 Unspecified asthma, uncomplicated: Secondary | ICD-10-CM | POA: Insufficient documentation

## 2022-10-19 DIAGNOSIS — I110 Hypertensive heart disease with heart failure: Secondary | ICD-10-CM | POA: Diagnosis not present

## 2022-10-19 DIAGNOSIS — I509 Heart failure, unspecified: Secondary | ICD-10-CM | POA: Diagnosis not present

## 2022-10-19 DIAGNOSIS — I25119 Atherosclerotic heart disease of native coronary artery with unspecified angina pectoris: Secondary | ICD-10-CM

## 2022-10-19 DIAGNOSIS — Z7984 Long term (current) use of oral hypoglycemic drugs: Secondary | ICD-10-CM | POA: Insufficient documentation

## 2022-10-19 DIAGNOSIS — I2510 Atherosclerotic heart disease of native coronary artery without angina pectoris: Secondary | ICD-10-CM | POA: Diagnosis not present

## 2022-10-19 DIAGNOSIS — K429 Umbilical hernia without obstruction or gangrene: Secondary | ICD-10-CM | POA: Insufficient documentation

## 2022-10-19 DIAGNOSIS — E785 Hyperlipidemia, unspecified: Secondary | ICD-10-CM | POA: Diagnosis not present

## 2022-10-19 DIAGNOSIS — K432 Incisional hernia without obstruction or gangrene: Secondary | ICD-10-CM | POA: Diagnosis present

## 2022-10-19 DIAGNOSIS — I5032 Chronic diastolic (congestive) heart failure: Secondary | ICD-10-CM | POA: Diagnosis not present

## 2022-10-19 DIAGNOSIS — J449 Chronic obstructive pulmonary disease, unspecified: Secondary | ICD-10-CM

## 2022-10-19 DIAGNOSIS — K439 Ventral hernia without obstruction or gangrene: Secondary | ICD-10-CM

## 2022-10-19 DIAGNOSIS — F17210 Nicotine dependence, cigarettes, uncomplicated: Secondary | ICD-10-CM

## 2022-10-19 DIAGNOSIS — E119 Type 2 diabetes mellitus without complications: Secondary | ICD-10-CM

## 2022-10-19 HISTORY — PX: INCISIONAL HERNIA REPAIR: SHX193

## 2022-10-19 HISTORY — PX: INSERTION OF MESH: SHX5868

## 2022-10-19 LAB — GLUCOSE, CAPILLARY
Glucose-Capillary: 157 mg/dL — ABNORMAL HIGH (ref 70–99)
Glucose-Capillary: 172 mg/dL — ABNORMAL HIGH (ref 70–99)
Glucose-Capillary: 187 mg/dL — ABNORMAL HIGH (ref 70–99)
Glucose-Capillary: 263 mg/dL — ABNORMAL HIGH (ref 70–99)
Glucose-Capillary: 231 mg/dL — ABNORMAL HIGH (ref 70–99)

## 2022-10-19 SURGERY — REPAIR, HERNIA, INCISIONAL
Anesthesia: General | Site: Abdomen

## 2022-10-19 MED ORDER — 0.9 % SODIUM CHLORIDE (POUR BTL) OPTIME
TOPICAL | Status: DC | PRN
  Administered 2022-10-19: 1000 mL

## 2022-10-19 MED ORDER — LACTATED RINGERS IV SOLN: INTRAVENOUS | Status: DC

## 2022-10-19 MED ORDER — FENTANYL CITRATE (PF) 250 MCG/5ML IJ SOLN
INTRAMUSCULAR | Status: AC
  Filled 2022-10-19: qty 5

## 2022-10-19 MED ORDER — OXYCODONE HCL 5 MG PO TABS
5.0000 mg | ORAL_TABLET | Freq: Once | ORAL | Status: AC | PRN
  Administered 2022-10-19: 5 mg via ORAL

## 2022-10-19 MED ORDER — POLYETHYLENE GLYCOL 3350 17 G PO PACK: 17.0000 g | PACK | Freq: Every day | ORAL | Status: DC | PRN

## 2022-10-19 MED ORDER — FENTANYL CITRATE (PF) 250 MCG/5ML IJ SOLN
INTRAMUSCULAR | Status: DC | PRN
  Administered 2022-10-19 (×4): 50 ug via INTRAVENOUS

## 2022-10-19 MED ORDER — INSULIN ASPART 100 UNIT/ML IJ SOLN
0.0000 [IU] | Freq: Every day | INTRAMUSCULAR | Status: DC
  Administered 2022-10-19: 2 [IU] via SUBCUTANEOUS

## 2022-10-19 MED ORDER — BUPROPION HCL ER (XL) 150 MG PO TB24
150.0000 mg | ORAL_TABLET | Freq: Every day | ORAL | Status: DC
  Administered 2022-10-20: 150 mg via ORAL
  Filled 2022-10-19: qty 1

## 2022-10-19 MED ORDER — POLYETHYLENE GLYCOL 3350 17 G PO PACK: 17.0000 g | PACK | Freq: Every day | ORAL | 0 refills | Status: DC | PRN

## 2022-10-19 MED ORDER — ONDANSETRON HCL 4 MG/2ML IJ SOLN: 4.0000 mg | Freq: Four times a day (QID) | INTRAMUSCULAR | Status: DC | PRN

## 2022-10-19 MED ORDER — SUCCINYLCHOLINE CHLORIDE 200 MG/10ML IV SOSY
PREFILLED_SYRINGE | INTRAVENOUS | Status: DC | PRN
  Administered 2022-10-19: 100 mg via INTRAVENOUS

## 2022-10-19 MED ORDER — OXYCODONE HCL 5 MG PO TABS
5.0000 mg | ORAL_TABLET | ORAL | Status: DC | PRN
  Administered 2022-10-19: 10 mg via ORAL
  Administered 2022-10-19 – 2022-10-20 (×2): 5 mg via ORAL
  Administered 2022-10-20: 10 mg via ORAL
  Administered 2022-10-20: 5 mg via ORAL
  Filled 2022-10-19 (×2): qty 1
  Filled 2022-10-19 (×2): qty 2
  Filled 2022-10-19: qty 1

## 2022-10-19 MED ORDER — OXYCODONE HCL 5 MG/5ML PO SOLN: 5.0000 mg | Freq: Once | ORAL | Status: AC | PRN

## 2022-10-19 MED ORDER — PROPOFOL 10 MG/ML IV BOLUS
INTRAVENOUS | Status: DC | PRN
  Administered 2022-10-19: 50 mg via INTRAVENOUS
  Administered 2022-10-19: 100 mg via INTRAVENOUS

## 2022-10-19 MED ORDER — SUGAMMADEX SODIUM 200 MG/2ML IV SOLN
INTRAVENOUS | Status: DC | PRN
  Administered 2022-10-19: 200 mg via INTRAVENOUS

## 2022-10-19 MED ORDER — ONDANSETRON 4 MG PO TBDP: 4.0000 mg | ORAL_TABLET | Freq: Four times a day (QID) | ORAL | Status: DC | PRN

## 2022-10-19 MED ORDER — DIPHENHYDRAMINE HCL 50 MG/ML IJ SOLN: 12.5000 mg | Freq: Four times a day (QID) | INTRAMUSCULAR | Status: DC | PRN

## 2022-10-19 MED ORDER — DOCUSATE SODIUM 100 MG PO CAPS
100.0000 mg | ORAL_CAPSULE | Freq: Two times a day (BID) | ORAL | Status: DC
  Administered 2022-10-19 – 2022-10-20 (×2): 100 mg via ORAL
  Filled 2022-10-19 (×2): qty 1

## 2022-10-19 MED ORDER — ROCURONIUM BROMIDE 10 MG/ML (PF) SYRINGE
PREFILLED_SYRINGE | INTRAVENOUS | Status: DC | PRN
  Administered 2022-10-19: 50 mg via INTRAVENOUS

## 2022-10-19 MED ORDER — ACETAMINOPHEN 500 MG PO TABS
1000.0000 mg | ORAL_TABLET | ORAL | Status: AC
  Administered 2022-10-19: 1000 mg via ORAL
  Filled 2022-10-19: qty 2

## 2022-10-19 MED ORDER — METHOCARBAMOL 500 MG PO TABS
500.0000 mg | ORAL_TABLET | Freq: Four times a day (QID) | ORAL | Status: DC
  Administered 2022-10-19 – 2022-10-20 (×5): 500 mg via ORAL
  Filled 2022-10-19 (×5): qty 1

## 2022-10-19 MED ORDER — ALBUTEROL SULFATE (2.5 MG/3ML) 0.083% IN NEBU: 3.0000 mL | INHALATION_SOLUTION | RESPIRATORY_TRACT | Status: DC | PRN

## 2022-10-19 MED ORDER — INSULIN ASPART 100 UNIT/ML IJ SOLN
0.0000 [IU] | Freq: Three times a day (TID) | INTRAMUSCULAR | Status: DC
  Administered 2022-10-19: 8 [IU] via SUBCUTANEOUS
  Administered 2022-10-20 (×2): 3 [IU] via SUBCUTANEOUS

## 2022-10-19 MED ORDER — ENOXAPARIN SODIUM 40 MG/0.4ML IJ SOSY
40.0000 mg | PREFILLED_SYRINGE | Freq: Every day | INTRAMUSCULAR | Status: DC
  Administered 2022-10-20: 40 mg via SUBCUTANEOUS
  Filled 2022-10-19: qty 0.4

## 2022-10-19 MED ORDER — BUPIVACAINE-EPINEPHRINE 0.25% -1:200000 IJ SOLN
INTRAMUSCULAR | Status: DC | PRN
  Administered 2022-10-19: 30 mL

## 2022-10-19 MED ORDER — ATORVASTATIN CALCIUM 80 MG PO TABS
80.0000 mg | ORAL_TABLET | Freq: Every day | ORAL | Status: DC
  Administered 2022-10-19: 80 mg via ORAL
  Filled 2022-10-19: qty 1

## 2022-10-19 MED ORDER — PROPOFOL 10 MG/ML IV BOLUS
INTRAVENOUS | Status: AC
  Filled 2022-10-19: qty 20

## 2022-10-19 MED ORDER — PANTOPRAZOLE SODIUM 40 MG PO TBEC
40.0000 mg | DELAYED_RELEASE_TABLET | Freq: Every day | ORAL | Status: DC
  Administered 2022-10-20: 40 mg via ORAL
  Filled 2022-10-19: qty 1

## 2022-10-19 MED ORDER — METOPROLOL TARTRATE 25 MG PO TABS
75.0000 mg | ORAL_TABLET | Freq: Two times a day (BID) | ORAL | Status: DC
  Administered 2022-10-19 – 2022-10-20 (×2): 75 mg via ORAL
  Filled 2022-10-19 (×2): qty 3

## 2022-10-19 MED ORDER — AMISULPRIDE (ANTIEMETIC) 5 MG/2ML IV SOLN: 10.0000 mg | Freq: Once | INTRAVENOUS | Status: DC | PRN

## 2022-10-19 MED ORDER — HYDROMORPHONE HCL 1 MG/ML IJ SOLN
INTRAMUSCULAR | Status: AC
  Filled 2022-10-19: qty 1

## 2022-10-19 MED ORDER — BUPIVACAINE-EPINEPHRINE (PF) 0.25% -1:200000 IJ SOLN
INTRAMUSCULAR | Status: AC
  Filled 2022-10-19: qty 30

## 2022-10-19 MED ORDER — HYDROMORPHONE HCL 1 MG/ML IJ SOLN
0.2500 mg | INTRAMUSCULAR | Status: DC | PRN
  Administered 2022-10-19 (×4): 0.5 mg via INTRAVENOUS

## 2022-10-19 MED ORDER — ONDANSETRON HCL 4 MG/2ML IJ SOLN
INTRAMUSCULAR | Status: DC | PRN
  Administered 2022-10-19: 4 mg via INTRAVENOUS

## 2022-10-19 MED ORDER — ROPINIROLE HCL 0.5 MG PO TABS
1.0000 mg | ORAL_TABLET | Freq: Every day | ORAL | Status: DC | PRN
  Filled 2022-10-19: qty 2

## 2022-10-19 MED ORDER — OXYCODONE HCL 5 MG PO TABS
ORAL_TABLET | ORAL | Status: AC
  Filled 2022-10-19: qty 1

## 2022-10-19 MED ORDER — MELATONIN 3 MG PO TABS: 3.0000 mg | ORAL_TABLET | Freq: Every evening | ORAL | Status: DC | PRN

## 2022-10-19 MED ORDER — CHLORHEXIDINE GLUCONATE 0.12 % MT SOLN
15.0000 mL | Freq: Once | OROMUCOSAL | Status: AC
  Administered 2022-10-19: 15 mL via OROMUCOSAL
  Filled 2022-10-19: qty 15

## 2022-10-19 MED ORDER — DIPHENHYDRAMINE HCL 12.5 MG/5ML PO ELIX: 12.5000 mg | ORAL_SOLUTION | Freq: Four times a day (QID) | ORAL | Status: DC | PRN

## 2022-10-19 MED ORDER — HYDROMORPHONE HCL 1 MG/ML IJ SOLN
0.5000 mg | INTRAMUSCULAR | Status: DC | PRN
  Administered 2022-10-20 (×2): 0.5 mg via INTRAVENOUS
  Filled 2022-10-19 (×2): qty 0.5

## 2022-10-19 MED ORDER — MIDAZOLAM HCL 2 MG/2ML IJ SOLN
INTRAMUSCULAR | Status: AC
  Filled 2022-10-19: qty 2

## 2022-10-19 MED ORDER — ROPINIROLE HCL 1 MG PO TABS
3.0000 mg | ORAL_TABLET | Freq: Every day | ORAL | Status: DC
  Administered 2022-10-19: 3 mg via ORAL
  Filled 2022-10-19 (×2): qty 3

## 2022-10-19 MED ORDER — MIDAZOLAM HCL 5 MG/5ML IJ SOLN
INTRAMUSCULAR | Status: DC | PRN
  Administered 2022-10-19: 2 mg via INTRAVENOUS

## 2022-10-19 MED ORDER — LIDOCAINE 2% (20 MG/ML) 5 ML SYRINGE
INTRAMUSCULAR | Status: DC | PRN
  Administered 2022-10-19: 60 mg via INTRAVENOUS

## 2022-10-19 MED ORDER — TRAZODONE HCL 50 MG PO TABS: 50.0000 mg | ORAL_TABLET | Freq: Every evening | ORAL | Status: DC | PRN

## 2022-10-19 MED ORDER — ORAL CARE MOUTH RINSE: 15.0000 mL | Freq: Once | OROMUCOSAL | Status: AC

## 2022-10-19 MED ORDER — PHENYLEPHRINE HCL-NACL 20-0.9 MG/250ML-% IV SOLN
INTRAVENOUS | Status: DC | PRN
  Administered 2022-10-19: 25 ug/min via INTRAVENOUS

## 2022-10-19 MED ORDER — ONDANSETRON HCL 4 MG/2ML IJ SOLN: 4.0000 mg | Freq: Once | INTRAMUSCULAR | Status: DC | PRN

## 2022-10-19 MED ORDER — DEXAMETHASONE SODIUM PHOSPHATE 10 MG/ML IJ SOLN
INTRAMUSCULAR | Status: DC | PRN
  Administered 2022-10-19: 4 mg via INTRAVENOUS

## 2022-10-19 MED ORDER — EZETIMIBE 10 MG PO TABS
10.0000 mg | ORAL_TABLET | Freq: Every day | ORAL | Status: DC
  Administered 2022-10-20: 10 mg via ORAL
  Filled 2022-10-19: qty 1

## 2022-10-19 MED ORDER — CEFAZOLIN SODIUM-DEXTROSE 2-4 GM/100ML-% IV SOLN
2.0000 g | INTRAVENOUS | Status: AC
  Administered 2022-10-19: 2 g via INTRAVENOUS
  Filled 2022-10-19: qty 100

## 2022-10-19 MED ORDER — ACETAMINOPHEN 500 MG PO TABS
1000.0000 mg | ORAL_TABLET | Freq: Four times a day (QID) | ORAL | Status: DC
  Administered 2022-10-19 – 2022-10-20 (×5): 1000 mg via ORAL
  Filled 2022-10-19 (×5): qty 2

## 2022-10-19 SURGICAL SUPPLY — 38 items
ADH SKN CLS APL DERMABOND .7 (GAUZE/BANDAGES/DRESSINGS) ×1
APL PRP STRL LF DISP 70% ISPRP (MISCELLANEOUS) ×1
BAG COUNTER SPONGE SURGICOUNT (BAG) ×1 IMPLANT
BAG SPNG CNTER NS LX DISP (BAG) ×1
CANISTER SUCT 3000ML PPV (MISCELLANEOUS) IMPLANT
CHLORAPREP W/TINT 26 (MISCELLANEOUS) ×1 IMPLANT
COVER SURGICAL LIGHT HANDLE (MISCELLANEOUS) ×1 IMPLANT
DERMABOND ADVANCED .7 DNX12 (GAUZE/BANDAGES/DRESSINGS) ×1 IMPLANT
DRAPE LAPAROTOMY 100X72 PEDS (DRAPES) ×1 IMPLANT
DRAPE LAPAROTOMY 100X72X124 (DRAPES) IMPLANT
ELECT REM PT RETURN 9FT ADLT (ELECTROSURGICAL) ×1
ELECTRODE REM PT RTRN 9FT ADLT (ELECTROSURGICAL) ×1 IMPLANT
GLOVE BIO SURGEON STRL SZ7.5 (GLOVE) IMPLANT
GLOVE BIOGEL PI IND STRL 6 (GLOVE) ×1 IMPLANT
GLOVE BIOGEL PI MICRO STRL 5.5 (GLOVE) ×1 IMPLANT
GLOVE INDICATOR 7.0 STRL GRN (GLOVE) IMPLANT
GOWN STRL REUS W/ TWL LRG LVL3 (GOWN DISPOSABLE) ×2 IMPLANT
GOWN STRL REUS W/TWL LRG LVL3 (GOWN DISPOSABLE) ×2
KIT BASIN OR (CUSTOM PROCEDURE TRAY) ×1 IMPLANT
KIT TURNOVER KIT B (KITS) ×1 IMPLANT
MARKER SKIN DUAL TIP RULER LAB (MISCELLANEOUS) IMPLANT
MESH VENTRALIGHT ST 4X6IN (Mesh General) IMPLANT
NDL HYPO 25GX1X1/2 BEV (NEEDLE) ×1 IMPLANT
NEEDLE HYPO 25GX1X1/2 BEV (NEEDLE) ×1 IMPLANT
NS IRRIG 1000ML POUR BTL (IV SOLUTION) ×1 IMPLANT
PACK GENERAL/GYN (CUSTOM PROCEDURE TRAY) ×1 IMPLANT
PAD ARMBOARD 7.5X6 YLW CONV (MISCELLANEOUS) ×1 IMPLANT
PENCIL SMOKE EVACUATOR (MISCELLANEOUS) ×1 IMPLANT
SUT MNCRL AB 4-0 PS2 18 (SUTURE) ×1 IMPLANT
SUT NOVA NAB DX-16 0-1 5-0 T12 (SUTURE) ×1 IMPLANT
SUT NOVA NAB GS-21 0 18 T12 DT (SUTURE) IMPLANT
SUT VIC AB 3-0 SH 27 (SUTURE) ×1
SUT VIC AB 3-0 SH 27X BRD (SUTURE) ×1 IMPLANT
SUT VIC AB 3-0 SH 8-18 (SUTURE) IMPLANT
SYR CONTROL 10ML LL (SYRINGE) ×1 IMPLANT
TOWEL GREEN STERILE (TOWEL DISPOSABLE) ×1 IMPLANT
TRAY FOLEY W/BAG SLVR 16FR (SET/KITS/TRAYS/PACK) ×1
TRAY FOLEY W/BAG SLVR 16FR ST (SET/KITS/TRAYS/PACK) ×1 IMPLANT

## 2022-10-19 NOTE — Anesthesia Procedure Notes (Signed)
Procedure Name: Intubation Date/Time: 10/19/2022 8:42 AM  Performed by: Wilburn Cornelia, CRNAPre-anesthesia Checklist: Patient identified, Emergency Drugs available, Suction available, Patient being monitored and Timeout performed Patient Re-evaluated:Patient Re-evaluated prior to induction Oxygen Delivery Method: Circle system utilized Preoxygenation: Pre-oxygenation with 100% oxygen Induction Type: IV induction Ventilation: Mask ventilation without difficulty Grade View: Grade I Tube type: Oral Tube size: 7.5 mm Airway Equipment and Method: Rigid stylet and Video-laryngoscopy Placement Confirmation: breath sounds checked- equal and bilateral, CO2 detector, positive ETCO2 and ETT inserted through vocal cords under direct vision Secured at: 23 cm Tube secured with: Tape Dental Injury: Teeth and Oropharynx as per pre-operative assessment

## 2022-10-19 NOTE — Progress Notes (Signed)
Pt placed on CPAP to rest. Pt tolerating well with stable vitals

## 2022-10-19 NOTE — Transfer of Care (Signed)
Immediate Anesthesia Transfer of Care Note  Patient: Shaun [name]  Procedure(s) Performed: OPEN INCISIONAL HERNIA REPAIR (Abdomen) INSERTION OF MESH (Abdomen)  Patient Location: PACU  Anesthesia Type:General  Level of Consciousness: awake and alert   Airway & Oxygen Therapy: Patient Spontanous Breathing and Patient connected to nasal cannula oxygen  Post-op Assessment: Report given to RN and Post -op Vital signs reviewed and stable  Post vital signs: Reviewed and stable  Last Vitals:  Vitals Value Taken Time  BP 107/92 10/19/22 1017  Temp 36.3 C 10/19/22 1017  Pulse 81 10/19/22 1019  Resp 17 10/19/22 1019  SpO2 91 % 10/19/22 1019  Vitals shown include unvalidated device data.  Last Pain:  Vitals:   10/19/22 0716  TempSrc:   PainSc: 6       Patients Stated Pain Goal: 0 (99991111 Q000111Q)  Complications: No notable events documented.

## 2022-10-19 NOTE — Op Note (Addendum)
Date: 10/19/22  Patient: Shaun [name] MRN: [id number]  Preoperative Diagnosis: Ventral incisional hernia Postoperative Diagnosis: Same  Procedure: Ventral incisional hernia repair with mesh underlay  Surgeon: Michaelle Birks, MD Assistant: Autumn Messing, MD  EBL: Minimal  Anesthesia: General endotracheal  Specimens: None  Indications: Shaun [name] is a 67 yo male with a symptomatic hernia containing chronically incarcerated by viable small bowel at the site of a previous laparotomy incision. Prior to surgery the defect measured 5cm. After a discussion of the risks and benefits of surgery, he has elected to proceed with repair.  Findings: 4cm periumbilical fascial defect containing small bowel. Repaired with a 6cm x 8cm Ventralight oval mesh underlay.  Procedure details: Informed consent was obtained in the preoperative area prior to the procedure. The patient was brought to the operating room and placed on the table in the supine position. General anesthesia was induced and appropriate lines and drains were placed for intraoperative monitoring. Perioperative antibiotics were administered per SCIP guidelines. The abdomen was prepped and draped in the usual sterile fashion. A pre-procedure timeout was taken verifying patient identity, surgical site and procedure to be performed.  A periumbilical vertical skin incision was made through the previous scar. The subcutaneous tissue was sharply divided and the hernia sac was opened. Within the sac there was a loop of viable small bowel, which was adherent to the sac. The small bowel was separated from the hernia sac by sharply lysing the adhesions. This loop of bowel was also adherent to the anterior abdominal wall, and was taken down carefully using sharp dissection. The entire adherent segment of bowel was then exteriorized and examined, and no injuries were identified. The bowel was completely reduced back into the abdomen. The fascia surrounding  the hernia defect was circumferentially palpated and was clear of any adhesions. The hernia sac was dissected out of the subcutaneous tissue using cautery, then taken off the fascia with cautery and discarded. The fascial defect was measured at 4cm. Subcutaneous flaps were raised circumferentially around the edges of the fascia using cautery. An oval sheet of Ventralight mesh was brought onto the field, and the inferior edge trimmed by about 1cm. The four points of the mesh were secured to the abdominal wall as an intraperitoneal underlay using 0 Novafil transfacial sutures. The edges of the mesh were then secured to the abdominal wall circumferentially with transfacial 0 Novafil sutures. All the sutures were then tied down. The mesh was examined and lay flat against the abdominal wall. The wound was irrigated and hemostasis was achieved in the subcutaneous tissue using cautery. The fascia was closed at midline over the mesh using interrupted 1 Novafil figure-of-eight sutures. The subcutaneous tissue was closed using interrupted 3-0 Vicryl sutures. The deep dermis was closed with interrupted 3-0 Vicryl sutures, and the skin was closed with a running subcuticular 4-0 monocryl suture. Dermabond was applied.  The patient tolerated the procedure well with no apparent complications. All counts were correct x2 at the end of the procedure. The patient was extubated and taken to PACU in stable condition.  Michaelle Birks, MD 10/19/22 10:14 AM

## 2022-10-19 NOTE — Anesthesia Postprocedure Evaluation (Signed)
Anesthesia Post Note  Patient: Shaun [name]  Procedure(s) Performed: OPEN INCISIONAL HERNIA REPAIR (Abdomen) INSERTION OF MESH (Abdomen)     Patient location during evaluation: PACU Anesthesia Type: General Level of consciousness: awake and alert and oriented Pain management: pain level controlled Vital Signs Assessment: post-procedure vital signs reviewed and stable Respiratory status: spontaneous breathing, nonlabored ventilation, respiratory function stable and patient connected to nasal cannula oxygen Cardiovascular status: blood pressure returned to baseline and stable Postop Assessment: no apparent nausea or vomiting Anesthetic complications: no   No notable events documented.  Last Vitals:  Vitals:   10/19/22 1100 10/19/22 1115  BP: 113/76 114/68  Pulse: 84 77  Resp: 10 12  Temp:    SpO2: 94% 96%    Last Pain:  Vitals:   10/19/22 1100  TempSrc:   PainSc: 3                  Gaurav Baldree A.

## 2022-10-19 NOTE — H&P (Signed)
Shaun [name] is an 67 y.o. male.   Chief Complaint: hernia HPI: Shaun [name] is a 67 yo male with an increasingly symptomatic incisional periumbilical hernia containing bowel. He presents today for repair. He has been having more pain. Unknown to me he has started smoking again since his last visit, about 5-6 cigarettes per day.  Past Medical History:  Diagnosis Date   Anemia    Anginal pain ([place])    Anxiety    Asthma    CAD (coronary artery disease)    DES to distal circumflex 2016   Cataract    CHF (congestive heart failure) (HCC)    Colon polyps    30 colon polyps found on first colonoscopy   Depression    Diastolic heart failure (HCC)    Diverticulitis    DJD (degenerative joint disease)    Dyspnea    Dysrhythmia    A-Fib   GERD (gastroesophageal reflux disease)    History of kidney stones    Hyperlipidemia    Hypertension    Insomnia    Obstructive sleep apnea 12/2009   01/26/2010 AHI 83/hr   Permanent atrial fibrillation ([place])    Onset 2006 paroxysmal then progressive to persistent   Pneumonia    PUD (peptic ulcer disease)    1980s   RLS (restless legs syndrome)    Sinusitis    Skin cancer    Type 2 diabetes mellitus ([place])     Past Surgical History:  Procedure Laterality Date   BIOPSY  07/17/2018   Procedure: BIOPSY;  Surgeon: Danie Binder, MD;  [facility];  Service: Endoscopy;;  colon   BOWEL RESECTION  09/17/2018   SMALL BOWEL RESECTION: 71 CM    CARDIAC CATHETERIZATION N/A 07/21/2015   Procedure: Left Heart Cath and Coronary Angiography;  Surgeon: Peter M Martinique, MD;  Location: [place] [facility];  Service: Cardiovascular;  Laterality: N/A;   CARDIAC CATHETERIZATION N/A 07/21/2015   Procedure: Coronary Stent Intervention;  Surgeon: Peter M Martinique, MD;  Location: [place] [facility];  Service: Cardiovascular;  Laterality: N/A;   CIRCUMCISION N/A 04/05/2019   Procedure: CIRCUMCISION ADULT;  Surgeon: Irine Seal, MD;  Location: [facility];   Service: Urology;  Laterality: N/A;   COLONOSCOPY N/A 05/19/2014   Dr. Barnie [name] diverticulosis/moderate external hemorrhoids, >20 simple adenomas. Genetic screening negative.    COLONOSCOPY WITH PROPOFOL N/A 07/17/2018   Dr. Oneida Alar: Diverticulosis, external/internal hemorrhoids, 32 colon polyps removed.  ten tubular adenomas removed with no high-grade dysplasia.  Advised to have surveillance colonoscopy in 3 years.   COLONOSCOPY WITH PROPOFOL N/A 06/07/2021   Procedure: COLONOSCOPY WITH PROPOFOL;  Surgeon: Eloise Harman, DO;  [facility];  Service: Endoscopy;  Laterality: N/A;  9:30 / ASA 3  (Pt was told that his time will be given at Pre-op)   ESOPHAGOGASTRODUODENOSCOPY (EGD) WITH PROPOFOL N/A 07/17/2018   Dr. Oneida Alar: Low-grade narrowing Schatzki ring at the GE junction status post dilation.  Gastritis.  Biopsy with mild nonspecific reactive gastropathy.  No H. pylori.   GIVENS CAPSULE STUDY N/A 06/24/2019   normal   HERNIA REPAIR  1986   Left inguinal   INTRAVASCULAR PRESSURE WIRE/FFR STUDY Left 06/08/2017   Procedure: INTRAVASCULAR PRESSURE WIRE/FFR STUDY;  Surgeon: Nelva Bush, MD;  Location: [place] [facility];  Service: Cardiovascular;  Laterality: Left;  LAD and CFX   LAPAROTOMY N/A 09/17/2018   Procedure: EXPLORATORY LAPAROTOMY;  Surgeon: Virl Cagey, MD;  Location: [facility];  Service: General;  Laterality: N/A;   LEFT HEART CATH AND CORONARY ANGIOGRAPHY N/A 06/08/2017   Procedure: LEFT HEART CATH AND CORONARY ANGIOGRAPHY;  Surgeon: Nelva Bush, MD;  Location: [place] [facility];  Service: Cardiovascular;  Laterality: N/A;   POLYPECTOMY  07/17/2018   Procedure: POLYPECTOMY;  Surgeon: Danie Binder, MD;  [facility];  Service: Endoscopy;;  colon   POLYPECTOMY  06/07/2021   Procedure: POLYPECTOMY INTESTINAL;  Surgeon: Eloise Harman, DO;  [facility];  Service: Endoscopy;;   ROTATOR CUFF REPAIR     Right   SAVORY DILATION N/A  07/17/2018   Procedure: SAVORY DILATION;  Surgeon: Danie Binder, MD;  [facility];  Service: Endoscopy;  Laterality: N/A;    Family History  Problem Relation Age of Onset   Hypertension Mother    Breast cancer Mother 29       brain/bone    Heart attack Father    Skin cancer Sister 64   Diabetes Brother    Parkinson's disease Brother    Brain cancer Maternal Uncle    Cancer Maternal Uncle        NOS   Breast cancer Cousin        maternal cousin dx <50   Cancer Cousin    Colon cancer Neg Hx    Social History:  reports that he has been smoking cigarettes. He started smoking about 52 years ago. He has a 19.50 pack-year smoking history. He has been exposed to tobacco smoke. He has never used smokeless tobacco. He reports that he does not drink alcohol and does not use drugs.  Allergies: No Known Allergies  Medications Prior to Admission  Medication Sig Dispense Refill   albuterol (VENTOLIN HFA) 108 (90 Base) MCG/ACT inhaler INHALE 2 PUFFS INTO THE LUNGS EVERY 6 HOURS AS NEEDED FOR WHEEZING OR SHORTNESS OF BREATH 18 g 11   apixaban (ELIQUIS) 5 MG TABS tablet TAKE 1 TABLET (5 MG) BY MOUTH TWICE DAILY 180 tablet 2   atorvastatin (LIPITOR) 80 MG tablet Take 1 tablet (80 mg total) by mouth daily. TAKE 1 TABLET(80 MG) BY MOUTH EVERY EVENING 90 tablet 2   azelastine (ASTELIN) 0.1 % nasal spray Place 1 spray into both nostrils 2 (two) times daily. Use in each nostril as directed (Patient taking differently: Place 1 spray into both nostrils daily as needed for allergies. Use in each nostril as directed) 30 mL 12   buPROPion (WELLBUTRIN XL) 150 MG 24 hr tablet Take 3 tablets once daily (Patient taking differently: Take 150 mg by mouth 3 (three) times daily.) 270 tablet 3   docusate sodium (COLACE) 100 MG capsule Take 1 capsule (100 mg total) by mouth daily as needed for mild constipation. (Patient taking differently: Take 100 mg by mouth daily.) 100 capsule 3   ezetimibe (ZETIA) 10  MG tablet Take 1 tablet (10 mg total) by mouth daily. 90 tablet 3   ferrous sulfate 325 (65 FE) MG tablet Take 325 mg by mouth daily with breakfast.     furosemide (LASIX) 40 MG tablet Take 1 tablet (40 mg total) by mouth daily. 90 tablet 2   HYDROcodone-acetaminophen (NORCO/VICODIN) 5-325 MG tablet Take 2 tablets by mouth 2 (two) times daily as needed for moderate pain. (Patient taking differently: Take 2 tablets by mouth 2 (two) times daily.) 120 tablet 0   magnesium 30 MG tablet Take 30 mg by mouth in the morning.     metFORMIN (GLUCOPHAGE) 500  MG tablet Take 1 tablet (500 mg total) by mouth 2 (two) times daily before a meal. 180 tablet 3   Metoprolol Tartrate 75 MG TABS Take 75 mg by mouth 2 (two) times daily. 120 tablet 3   omeprazole (PRILOSEC) 40 MG capsule Take 1 capsule by mouth once daily 30 minutes before breakfast 90 capsule 3   potassium chloride SA (KLOR-CON M) 20 MEQ tablet Take 1 tablet (20 mEq total) by mouth daily. 90 tablet 3   rOPINIRole (REQUIP) 3 MG tablet Take 1 tab nightly for RLS and a 1/2 tab during the day to control breakthrough symptoms 45 tablet 3   sucralfate (CARAFATE) 1 g tablet Take one tablet po BID PRN (Patient taking differently: Take 1 g by mouth daily.) 42 tablet 5   tiZANidine (ZANAFLEX) 4 MG tablet Take 1 tablet (4 mg total) by mouth 2 (two) times daily as needed for muscle spasms. (Patient taking differently: Take 4 mg by mouth 2 (two) times daily.) 60 tablet 0   traZODone (DESYREL) 50 MG tablet TAKE 1 TABLET BY MOUTH EVERY NIGHT AT BEDTIME FOR 7 DAYS THEN TAKE 2 TABLETS BY MOUTH EVERY NIGHT AT BEDTIME (Patient taking differently: Take 100 mg by mouth at bedtime.) 60 tablet 3   acetaminophen (TYLENOL) 325 MG tablet Take 2 tablets (650 mg total) by mouth every 6 (six) hours as needed for mild pain.     nitroGLYCERIN (NITROSTAT) 0.4 MG SL tablet Place 1 tablet (0.4 mg total) under the tongue every 5 (five) minutes as needed. 25 tablet 3   sildenafil (VIAGRA) 100  MG tablet TAKE 1 TABLET BY MOUTH 30 MINUTES BEFORE ACTIVITY (Patient taking differently: Take 100 mg by mouth daily as needed for erectile dysfunction.  30 MINUTES BEFORE ACTIVITY) 8 tablet 0   triamcinolone ointment (KENALOG) 0.1 % Apply 1 application. topically 2 (two) times daily. (Patient not taking: Reported on 10/14/2022) 90 g 1    Results for orders placed or performed during the hospital encounter of 10/19/22 (from the past 48 hour(s))  Glucose, capillary     Status: Abnormal   Collection Time: 10/19/22  6:44 AM  Result Value Ref Range   Glucose-Capillary 157 (H) 70 - 99 mg/dL    Comment: Glucose reference range applies only to samples taken after fasting for at least 8 hours.   *Note: Due to a large number of results and/or encounters for the requested time period, some results have not been displayed. A complete set of results can be found in Results Review.   No results found.  Review of Systems  Blood pressure 130/87, pulse 80, temperature 98.5 F (36.9 C), temperature source Oral, resp. rate 18, height 5' 8"$  (1.727 m), weight 100.7 kg, SpO2 95 %. Physical Exam Constitutional:      General: He is not in acute distress.    Appearance: Normal appearance.  Eyes:     General: No scleral icterus.    Conjunctiva/sclera: Conjunctivae normal.  Cardiovascular:     Rate and Rhythm: Normal rate and regular rhythm.  Pulmonary:     Effort: Pulmonary effort is normal. No respiratory distress.  Abdominal:     General: There is no distension.     Palpations: Abdomen is soft.     Tenderness: There is no abdominal tenderness.     Comments: Well-healed midline scar with periumbilical hernia.  Musculoskeletal:        General: Normal range of motion.  Skin:    General: Skin is warm and  dry.  Neurological:     General: No focal deficit present.     Mental Status: He is alert and oriented to person, place, and time.      Assessment/Plan 67 yo male with a symptomatic incisional  hernia containing chronically incarcerated bowel without obstruction. I have reviewed the details of the surgery. I discussed that I typically would not offer elective hernia repair in a patient who is actively smoking, and reviewed the increased risk of mesh infection, hernia recurrence and wound healing complications. I discussed rescheduling his surgery until after he has stopped smoking. He expressed understanding of the risks and given the increasing severity of his symptoms, would like to proceed with surgery. Given the presence of bowel within the hernia and his symptoms, we will proceed with repair but I strongly encouraged him to quit smoking.  Dwan Bolt, MD 10/19/2022, 8:30 AM

## 2022-10-19 NOTE — Discharge Instructions (Addendum)
[facility] DISCHARGE INSTRUCTIONS: HERNIA REPAIR  Activity No heavy lifting greater than 15 pounds for 8 weeks after surgery. Ok to shower in 24 hours after surgery, but do not bathe or submerge incisions underwater. Do not drive while taking narcotic pain medication. You may wear an abdominal binder for comfort when out of bed. Do not drive until your office visit with Dr. Zenia Resides.  Wound Care Your incision is covered with skin glue called Dermabond. This will peel off on its own over time. You may shower and allow warm soapy water to run over your incisions. Gently pat dry. Do not submerge your incision underwater. Monitor your incision for any new redness, tenderness, or drainage.  When to Call us: Fever greater than 100.5 New redness, drainage, or swelling at incision site Severe pain, nausea, or vomiting  Follow-up You have an appointment scheduled with Dr. Zenia Resides on November 08, 2022 at 9:20am. This will be at the [facility] Surgery office at [address], [place] [facility] [place], [place], [place]. Please arrive at least 15 minutes prior to your scheduled appointment time.  For questions or concerns, please call the office at [phone].

## 2022-10-20 ENCOUNTER — Other Ambulatory Visit (HOSPITAL_BASED_OUTPATIENT_CLINIC_OR_DEPARTMENT_OTHER): Payer: Self-pay

## 2022-10-20 ENCOUNTER — Encounter (HOSPITAL_COMMUNITY): Payer: Self-pay | Admitting: Surgery

## 2022-10-20 DIAGNOSIS — E785 Hyperlipidemia, unspecified: Secondary | ICD-10-CM | POA: Diagnosis not present

## 2022-10-20 DIAGNOSIS — I5032 Chronic diastolic (congestive) heart failure: Secondary | ICD-10-CM | POA: Diagnosis not present

## 2022-10-20 DIAGNOSIS — K430 Incisional hernia with obstruction, without gangrene: Secondary | ICD-10-CM | POA: Diagnosis not present

## 2022-10-20 DIAGNOSIS — E119 Type 2 diabetes mellitus without complications: Secondary | ICD-10-CM | POA: Diagnosis not present

## 2022-10-20 DIAGNOSIS — I110 Hypertensive heart disease with heart failure: Secondary | ICD-10-CM | POA: Diagnosis not present

## 2022-10-20 DIAGNOSIS — Z7984 Long term (current) use of oral hypoglycemic drugs: Secondary | ICD-10-CM | POA: Diagnosis not present

## 2022-10-20 DIAGNOSIS — J45909 Unspecified asthma, uncomplicated: Secondary | ICD-10-CM | POA: Diagnosis not present

## 2022-10-20 DIAGNOSIS — K429 Umbilical hernia without obstruction or gangrene: Secondary | ICD-10-CM | POA: Diagnosis not present

## 2022-10-20 DIAGNOSIS — I2510 Atherosclerotic heart disease of native coronary artery without angina pectoris: Secondary | ICD-10-CM | POA: Diagnosis not present

## 2022-10-20 DIAGNOSIS — F17210 Nicotine dependence, cigarettes, uncomplicated: Secondary | ICD-10-CM | POA: Diagnosis not present

## 2022-10-20 LAB — GLUCOSE, CAPILLARY
Glucose-Capillary: 193 mg/dL — ABNORMAL HIGH (ref 70–99)
Glucose-Capillary: 156 mg/dL — ABNORMAL HIGH (ref 70–99)

## 2022-10-20 MED ORDER — OXYCODONE HCL 5 MG PO TABS
5.0000 mg | ORAL_TABLET | ORAL | 0 refills | Status: DC | PRN
  Filled 2022-10-20: qty 20, 4d supply, fill #0

## 2022-10-20 NOTE — Progress Notes (Signed)
   10/20/22 1500  Mobility  Activity Ambulated independently in hallway  Level of Assistance Independent  Assistive Device None  Distance Ambulated (ft) 360 ft  Activity Response Tolerated well  Mobility Referral Yes  $Mobility charge 1 Mobility   Mobility Specialist Progress Note  Pt was in bed and agreeable. Had no c/o pain. Returned EOB w/ all needs met and call bell in reach.  Lucious Groves Mobility Specialist  Please contact via SecureChat or Rehab office at [phone]

## 2022-10-20 NOTE — Progress Notes (Signed)
    1 Day Post-Op  Subjective: No acute issues overnight. Reports significant pain this morning.   Objective: Vital signs in last 24 hours: Temp:  [97.3 F (36.3 C)-98.4 F (36.9 C)] 97.5 F (36.4 C) (02/22 0452) Pulse Rate:  [77-109] 79 (02/22 0452) Resp:  [10-20] 20 (02/21 2309) BP: (107-156)/(68-102) 136/102 (02/22 0452) SpO2:  [91 %-96 %] 94 % (02/22 0452) FiO2 (%):  [21 %] 21 % (02/21 2309)    Intake/Output from previous day: 02/21 0701 - 02/22 0700 In: 1140 [P.O.:240; I.V.:800; IV Piggyback:100] Out: 170 [Urine:150; Blood:20] Intake/Output this shift: No intake/output data recorded.  PE: General: resting comfortably, NAD Neuro: alert and oriented, no focal deficits Resp: normal work of breathing on room air Abdomen: soft, nondistended, incision clean and dry, appropriately tender to palpation Extremities: warm and well-perfused   Lab Results:  No results for input(s): "WBC", "HGB", "HCT", "PLT" in the last 72 hours. BMET No results for input(s): "NA", "K", "CL", "CO2", "GLUCOSE", "BUN", "CREATININE", "CALCIUM" in the last 72 hours. PT/INR No results for input(s): "LABPROT", "INR" in the last 72 hours. CMP     Component Value Date/Time   NA 138 10/14/2022 0929   NA 144 06/28/2021 0845   K 4.2 10/14/2022 0929   CL 105 10/14/2022 0929   CO2 22 10/14/2022 0929   GLUCOSE 122 (H) 10/14/2022 0929   BUN 17 10/14/2022 0929   BUN 15 06/28/2021 0845   CREATININE 0.96 10/14/2022 0929   CREATININE 0.92 12/15/2017 0744   CALCIUM 8.6 (L) 10/14/2022 0929   PROT 6.6 04/19/2022 1054   PROT 6.0 06/28/2021 0845   ALBUMIN 4.1 04/19/2022 1054   ALBUMIN 3.8 06/28/2021 0845   AST 17 04/19/2022 1054   ALT 23 04/19/2022 1054   ALKPHOS 92 04/19/2022 1054   BILITOT 0.5 04/19/2022 1054   BILITOT 0.5 06/28/2021 0845   GFRNONAA >60 10/14/2022 0929   GFRNONAA 89 12/15/2017 0744   GFRAA 98 01/01/2020 0758   GFRAA 104 12/15/2017 0744   Lipase     Component Value Date/Time    LIPASE 17 09/17/2018 1229       Studies/Results: No results found.  Anti-infectives: Anti-infectives (From admission, onward)    Start     Dose/Rate Route Frequency Ordered Stop   10/19/22 0700  ceFAZolin (ANCEF) IVPB 2g/100 mL premix        2 g 200 mL/hr over 30 Minutes Intravenous On call to O.R. 10/19/22 [id number] 10/19/22 0848        Assessment/Plan 67 yo male with ventral incisional hernia, POD1 s/p repair. - Carb control diet - Multimodal pain control - Ambulate - Abdominal binder for comfort - VTE: lovenox, SCDs. Resume Eliquis 48 hours postop. - Anticipate discharge home this afternoon if pain control improved     LOS: 0 days    Michaelle Birks, MD [facility] Surgery General, Hepatobiliary and Pancreatic Surgery 10/20/22 6:50 AM

## 2022-10-20 NOTE — Discharge Summary (Signed)
[facility] Discharge Summary   Patient ID: Shaun [name] MRN: [id number] DOB/AGE: [birth year] 67 y.o.  Admit date: 10/19/2022 Discharge date: 10/20/2022   Discharge Diagnosis Ventral incisional hernia   Consultants None  Imaging: No results found.  Procedures Dr. Zenia Resides (10/19/2022) - Ventral incisional hernia repair with mesh underlay  Hospital Course:  Shaun [name] is a 67 y.o. male with an increasingly symptomatic incisional periumbilical hernia containing bowel. [facility] and underwent procedure listed above.  Tolerated procedure well and was transferred to the floor.  Diet was advanced as tolerated.  On POD1, the patient was voiding well, tolerating diet, ambulating well, pain well controlled, vital signs stable, incisions c/d/i and felt stable for discharge home.  Patient will follow up as below and knows to call with questions or concerns.      Allergies as of 10/20/2022   No Known Allergies      Medication List     STOP taking these medications    HYDROcodone-acetaminophen 5-325 MG tablet Commonly known as: NORCO/VICODIN       TAKE these medications    acetaminophen 325 MG tablet Commonly known as: TYLENOL Take 2 tablets (650 mg total) by mouth every 6 (six) hours as needed for mild pain.   albuterol 108 (90 Base) MCG/ACT inhaler Commonly known as: Ventolin HFA INHALE 2 PUFFS INTO THE LUNGS EVERY 6 HOURS AS NEEDED FOR WHEEZING OR SHORTNESS OF BREATH   atorvastatin 80 MG tablet Commonly known as: LIPITOR Take 1 tablet (80 mg total) by mouth once daily in the evening.  Please follow up in 2-3 months. (Take 1 tablet (80 mg total) by mouth daily. TAKE 1 TABLET(80 MG) BY MOUTH EVERY EVENING)   Azelastine HCl 137 MCG/SPRAY Soln Place 1 spray into both nostrils 2 (two) times daily. Use in each nostril as directed What changed:  when to take this reasons to take this   buPROPion 150 MG 24 hr tablet Commonly known  as: Wellbutrin XL Take 3 tablets by mouth once daily. (Take 3 tablets once daily) What changed:  how much to take how to take this when to take this additional instructions   docusate sodium 100 MG capsule Commonly known as: COLACE Take 1 capsule (100 mg total) by mouth daily as needed for mild constipation. What changed: when to take this   Eliquis 5 MG Tabs tablet Generic drug: apixaban TAKE 1 TABLET (5 MG) BY MOUTH TWICE DAILY   ezetimibe 10 MG tablet Commonly known as: ZETIA Take 1 tablet (10 mg total) by mouth daily.   ferrous sulfate 325 (65 FE) MG tablet Take 325 mg by mouth daily with breakfast.   furosemide 40 MG tablet Commonly known as: LASIX Take 1 tablet (40 mg total) by mouth daily.   magnesium 30 MG tablet Take 30 mg by mouth in the morning.   metFORMIN 500 MG tablet Commonly known as: GLUCOPHAGE Take 1 tablet ('500mg'$ ) by mouth twice daily with a meal. (Take 1 tablet (500 mg total) by mouth 2 (two) times daily before a meal.)   Metoprolol Tartrate 75 MG Tabs Take 1 tablet by mouth twice daily. (Take 75 mg by mouth 2 (two) times daily.)   nitroGLYCERIN 0.4 MG SL tablet Commonly known as: NITROSTAT Place 1 tablet (0.4 mg total) under the tongue every 5 (five) minutes as needed.   omeprazole 40 MG capsule Commonly known as: PRILOSEC Take 1 capsule by mouth once daily 30 minutes before breakfast  oxyCODONE 5 MG immediate release tablet Commonly known as: Oxy IR/ROXICODONE Take 1 tablet (5 mg total) by mouth every 4 (four) hours as needed for severe pain.   polyethylene glycol 17 g packet Commonly known as: MIRALAX / GLYCOLAX Take 17 g by mouth daily as needed for mild constipation.   potassium chloride SA 20 MEQ tablet Commonly known as: KLOR-CON M Take 1 tablet (20 mEq total) by mouth daily.   rOPINIRole 3 MG tablet Commonly known as: REQUIP Take 1 tablet by mouth at night for restless leg syndrome, and 0.5 tablet during the day for  breakthrough symptoms. (Take 1 tab nightly for RLS and a 1/2 tab during the day to control breakthrough symptoms)   sildenafil 100 MG tablet Commonly known as: VIAGRA TAKE 1 TABLET BY MOUTH 30 MINUTES BEFORE ACTIVITY What changed:  how much to take how to take this when to take this reasons to take this additional instructions   sucralfate 1 g tablet Commonly known as: CARAFATE Take one tablet po BID PRN What changed:  how much to take how to take this when to take this additional instructions   tiZANidine 4 MG tablet Commonly known as: Zanaflex Take 1 tablet (4 mg total) by mouth 2 (two) times daily as needed for muscle spasms. What changed: when to take this   traZODone 50 MG tablet Commonly known as: DESYREL TAKE 1 TABLET BY MOUTH EVERY NIGHT AT BEDTIME FOR 7 DAYS THEN TAKE 2 TABLETS BY MOUTH EVERY NIGHT AT BEDTIME What changed:  how much to take how to take this when to take this additional instructions   triamcinolone ointment 0.1 % Commonly known as: KENALOG Apply to the affected areas 2 times daily (Apply 1 application. topically 2 (two) times daily.)          Follow-up Information     Shaun Bolt, MD Follow up.   Specialty: General Surgery Contact information: [address] [place] [address] [phone]                  Signed: Wellington [name], [facility] Surgery 10/20/2022, 3:27 PM Please see Amion for pager number during day hours 7:00am-4:30pm

## 2022-10-26 ENCOUNTER — Other Ambulatory Visit (HOSPITAL_BASED_OUTPATIENT_CLINIC_OR_DEPARTMENT_OTHER): Payer: Self-pay

## 2022-10-27 ENCOUNTER — Other Ambulatory Visit: Payer: Self-pay | Admitting: Nurse Practitioner

## 2022-10-27 ENCOUNTER — Other Ambulatory Visit (HOSPITAL_BASED_OUTPATIENT_CLINIC_OR_DEPARTMENT_OTHER): Payer: Self-pay

## 2022-10-27 DIAGNOSIS — M2550 Pain in unspecified joint: Secondary | ICD-10-CM

## 2022-10-27 NOTE — Telephone Encounter (Signed)
Refill request not sure if is still going to be on this. Last apt 08/20/22.

## 2022-10-28 ENCOUNTER — Other Ambulatory Visit (HOSPITAL_BASED_OUTPATIENT_CLINIC_OR_DEPARTMENT_OTHER): Payer: Self-pay

## 2022-10-28 MED ORDER — HYDROCODONE-ACETAMINOPHEN 5-325 MG PO TABS
2.0000 | ORAL_TABLET | Freq: Two times a day (BID) | ORAL | 0 refills | Status: DC | PRN
  Filled 2022-10-28: qty 1, 1d supply, fill #0
  Filled 2022-10-28: qty 119, 29d supply, fill #0

## 2022-10-29 ENCOUNTER — Encounter (HOSPITAL_COMMUNITY): Payer: Self-pay | Admitting: *Deleted

## 2022-10-29 ENCOUNTER — Other Ambulatory Visit: Payer: Self-pay

## 2022-10-29 ENCOUNTER — Emergency Department (HOSPITAL_COMMUNITY): Payer: Medicare Other

## 2022-10-29 ENCOUNTER — Inpatient Hospital Stay (HOSPITAL_COMMUNITY)
Admission: EM | Admit: 2022-10-29 | Discharge: 2022-10-31 | DRG: 920 | Disposition: A | Discharge status: Home / Self Care | Payer: Medicare Other | Attending: Family Medicine | Admitting: Family Medicine

## 2022-10-29 DIAGNOSIS — E860 Dehydration: Secondary | ICD-10-CM | POA: Diagnosis not present

## 2022-10-29 DIAGNOSIS — Z955 Presence of coronary angioplasty implant and graft: Secondary | ICD-10-CM

## 2022-10-29 DIAGNOSIS — E785 Hyperlipidemia, unspecified: Secondary | ICD-10-CM | POA: Diagnosis present

## 2022-10-29 DIAGNOSIS — E872 Acidosis, unspecified: Secondary | ICD-10-CM | POA: Diagnosis not present

## 2022-10-29 DIAGNOSIS — K5730 Diverticulosis of large intestine without perforation or abscess without bleeding: Secondary | ICD-10-CM | POA: Diagnosis not present

## 2022-10-29 DIAGNOSIS — K91872 Postprocedural seroma of a digestive system organ or structure following a digestive system procedure: Principal | ICD-10-CM | POA: Diagnosis present

## 2022-10-29 DIAGNOSIS — K7689 Other specified diseases of liver: Secondary | ICD-10-CM | POA: Diagnosis not present

## 2022-10-29 DIAGNOSIS — F32A Depression, unspecified: Secondary | ICD-10-CM | POA: Diagnosis present

## 2022-10-29 DIAGNOSIS — Z86010 Personal history of colonic polyps: Secondary | ICD-10-CM | POA: Diagnosis not present

## 2022-10-29 DIAGNOSIS — Z7984 Long term (current) use of oral hypoglycemic drugs: Secondary | ICD-10-CM | POA: Diagnosis not present

## 2022-10-29 DIAGNOSIS — K219 Gastro-esophageal reflux disease without esophagitis: Secondary | ICD-10-CM | POA: Diagnosis present

## 2022-10-29 DIAGNOSIS — Y838 Other surgical procedures as the cause of abnormal reaction of the patient, or of later complication, without mention of misadventure at the time of the procedure: Secondary | ICD-10-CM | POA: Diagnosis present

## 2022-10-29 DIAGNOSIS — I2510 Atherosclerotic heart disease of native coronary artery without angina pectoris: Secondary | ICD-10-CM | POA: Diagnosis not present

## 2022-10-29 DIAGNOSIS — E669 Obesity, unspecified: Secondary | ICD-10-CM | POA: Diagnosis present

## 2022-10-29 DIAGNOSIS — G4733 Obstructive sleep apnea (adult) (pediatric): Secondary | ICD-10-CM | POA: Diagnosis present

## 2022-10-29 DIAGNOSIS — Z87442 Personal history of urinary calculi: Secondary | ICD-10-CM | POA: Diagnosis not present

## 2022-10-29 DIAGNOSIS — Z79899 Other long term (current) drug therapy: Secondary | ICD-10-CM | POA: Diagnosis not present

## 2022-10-29 DIAGNOSIS — Z833 Family history of diabetes mellitus: Secondary | ICD-10-CM

## 2022-10-29 DIAGNOSIS — F17210 Nicotine dependence, cigarettes, uncomplicated: Secondary | ICD-10-CM | POA: Diagnosis present

## 2022-10-29 DIAGNOSIS — Z8711 Personal history of peptic ulcer disease: Secondary | ICD-10-CM

## 2022-10-29 DIAGNOSIS — G2581 Restless legs syndrome: Secondary | ICD-10-CM | POA: Diagnosis present

## 2022-10-29 DIAGNOSIS — Z820 Family history of epilepsy and other diseases of the nervous system: Secondary | ICD-10-CM

## 2022-10-29 DIAGNOSIS — Z7901 Long term (current) use of anticoagulants: Secondary | ICD-10-CM | POA: Diagnosis not present

## 2022-10-29 DIAGNOSIS — I10 Essential (primary) hypertension: Secondary | ICD-10-CM | POA: Diagnosis not present

## 2022-10-29 DIAGNOSIS — I110 Hypertensive heart disease with heart failure: Secondary | ICD-10-CM | POA: Diagnosis present

## 2022-10-29 DIAGNOSIS — Z720 Tobacco use: Secondary | ICD-10-CM | POA: Diagnosis present

## 2022-10-29 DIAGNOSIS — I4821 Permanent atrial fibrillation: Secondary | ICD-10-CM | POA: Diagnosis not present

## 2022-10-29 DIAGNOSIS — F17200 Nicotine dependence, unspecified, uncomplicated: Secondary | ICD-10-CM | POA: Diagnosis not present

## 2022-10-29 DIAGNOSIS — E119 Type 2 diabetes mellitus without complications: Secondary | ICD-10-CM | POA: Diagnosis not present

## 2022-10-29 DIAGNOSIS — J45909 Unspecified asthma, uncomplicated: Secondary | ICD-10-CM | POA: Diagnosis not present

## 2022-10-29 DIAGNOSIS — D72829 Elevated white blood cell count, unspecified: Secondary | ICD-10-CM | POA: Diagnosis present

## 2022-10-29 DIAGNOSIS — G8918 Other acute postprocedural pain: Principal | ICD-10-CM

## 2022-10-29 DIAGNOSIS — Z85828 Personal history of other malignant neoplasm of skin: Secondary | ICD-10-CM

## 2022-10-29 DIAGNOSIS — Z8701 Personal history of pneumonia (recurrent): Secondary | ICD-10-CM

## 2022-10-29 DIAGNOSIS — Z8249 Family history of ischemic heart disease and other diseases of the circulatory system: Secondary | ICD-10-CM

## 2022-10-29 DIAGNOSIS — Z9861 Coronary angioplasty status: Secondary | ICD-10-CM | POA: Diagnosis not present

## 2022-10-29 DIAGNOSIS — K279 Peptic ulcer, site unspecified, unspecified as acute or chronic, without hemorrhage or perforation: Secondary | ICD-10-CM | POA: Diagnosis present

## 2022-10-29 DIAGNOSIS — Z803 Family history of malignant neoplasm of breast: Secondary | ICD-10-CM

## 2022-10-29 DIAGNOSIS — Z6834 Body mass index (BMI) 34.0-34.9, adult: Secondary | ICD-10-CM | POA: Diagnosis not present

## 2022-10-29 DIAGNOSIS — E1122 Type 2 diabetes mellitus with diabetic chronic kidney disease: Secondary | ICD-10-CM | POA: Diagnosis present

## 2022-10-29 DIAGNOSIS — K8020 Calculus of gallbladder without cholecystitis without obstruction: Secondary | ICD-10-CM | POA: Diagnosis not present

## 2022-10-29 DIAGNOSIS — I5032 Chronic diastolic (congestive) heart failure: Secondary | ICD-10-CM | POA: Diagnosis not present

## 2022-10-29 DIAGNOSIS — I482 Chronic atrial fibrillation, unspecified: Secondary | ICD-10-CM | POA: Diagnosis not present

## 2022-10-29 DIAGNOSIS — K56609 Unspecified intestinal obstruction, unspecified as to partial versus complete obstruction: Secondary | ICD-10-CM | POA: Diagnosis not present

## 2022-10-29 DIAGNOSIS — N281 Cyst of kidney, acquired: Secondary | ICD-10-CM | POA: Diagnosis not present

## 2022-10-29 DIAGNOSIS — E1165 Type 2 diabetes mellitus with hyperglycemia: Secondary | ICD-10-CM

## 2022-10-29 DIAGNOSIS — R000 Tachycardia, unspecified: Secondary | ICD-10-CM | POA: Diagnosis not present

## 2022-10-29 DIAGNOSIS — R1084 Generalized abdominal pain: Secondary | ICD-10-CM | POA: Diagnosis not present

## 2022-10-29 DIAGNOSIS — Z808 Family history of malignant neoplasm of other organs or systems: Secondary | ICD-10-CM

## 2022-10-29 LAB — COMPREHENSIVE METABOLIC PANEL
ALT: 35 U/L (ref 0–44)
AST: 31 U/L (ref 15–41)
Albumin: 3.7 g/dL (ref 3.5–5.0)
Alkaline Phosphatase: 83 U/L (ref 38–126)
Anion gap: 11 (ref 5–15)
BUN: 19 mg/dL (ref 8–23)
CO2: 21 mmol/L — ABNORMAL LOW (ref 22–32)
Calcium: 8.6 mg/dL — ABNORMAL LOW (ref 8.9–10.3)
Chloride: 102 mmol/L (ref 98–111)
Creatinine, Ser: 0.9 mg/dL (ref 0.61–1.24)
GFR, Estimated: >60 mL/min (ref >60)
Glucose, Bld: 139 mg/dL — ABNORMAL HIGH (ref 70–99)
Potassium: 4.1 mmol/L (ref 3.5–5.1)
Sodium: 134 mmol/L — ABNORMAL LOW (ref 135–145)
Total Bilirubin: 0.8 mg/dL (ref 0.3–1.2)
Total Protein: 6.7 g/dL (ref 6.5–8.1)

## 2022-10-29 LAB — CBC WITH DIFFERENTIAL/PLATELET
Abs Immature Granulocytes: 0.06 10*3/uL (ref 0.00–0.07)
Basophils Absolute: 0 10*3/uL (ref 0.0–0.1)
Basophils Relative: 0 %
Eosinophils Absolute: 0.1 10*3/uL (ref 0.0–0.5)
Eosinophils Relative: 1 %
HCT: 41.1 % (ref 39.0–52.0)
Hemoglobin: 13.4 g/dL (ref 13.0–17.0)
Immature Granulocytes: 1 %
Lymphocytes Relative: 12 %
Lymphs Abs: 1.5 10*3/uL (ref 0.7–4.0)
MCH: 27.6 pg (ref 26.0–34.0)
MCHC: 32.6 g/dL (ref 30.0–36.0)
MCV: 84.6 fL (ref 80.0–100.0)
Monocytes Absolute: 0.5 10*3/uL (ref 0.1–1.0)
Monocytes Relative: 4 %
Neutro Abs: 10 10*3/uL — ABNORMAL HIGH (ref 1.7–7.7)
Neutrophils Relative %: 82 %
Platelets: 217 10*3/uL (ref 150–400)
RBC: 4.86 MIL/uL (ref 4.22–5.81)
RDW: 13.2 % (ref 11.5–15.5)
WBC: 12.3 10*3/uL — ABNORMAL HIGH (ref 4.0–10.5)
nRBC: 0 % (ref 0.0–0.2)

## 2022-10-29 LAB — LIPASE, BLOOD: Lipase: 23 U/L (ref 11–51)

## 2022-10-29 LAB — LACTIC ACID, PLASMA
Lactic Acid, Venous: 3.7 mmol/L (ref 0.5–1.9)
Lactic Acid, Venous: 3.6 mmol/L (ref 0.5–1.9)

## 2022-10-29 MED ORDER — LACTATED RINGERS IV BOLUS
1000.0000 mL | Freq: Once | INTRAVENOUS | Status: AC
  Administered 2022-10-29: 1000 mL via INTRAVENOUS

## 2022-10-29 MED ORDER — FENTANYL CITRATE PF 50 MCG/ML IJ SOSY
50.0000 ug | PREFILLED_SYRINGE | Freq: Once | INTRAMUSCULAR | Status: AC
  Administered 2022-10-29: 50 ug via INTRAVENOUS
  Filled 2022-10-29: qty 1

## 2022-10-29 MED ORDER — LACTATED RINGERS IV SOLN: INTRAVENOUS | Status: DC

## 2022-10-29 MED ORDER — MORPHINE SULFATE (PF) 2 MG/ML IV SOLN
2.0000 mg | INTRAVENOUS | Status: DC | PRN
  Administered 2022-10-29 – 2022-10-30 (×4): 2 mg via INTRAVENOUS
  Filled 2022-10-29 (×4): qty 1

## 2022-10-29 MED ORDER — HYDROMORPHONE HCL 1 MG/ML IJ SOLN
1.0000 mg | Freq: Once | INTRAMUSCULAR | Status: AC
  Administered 2022-10-29: 1 mg via INTRAVENOUS
  Filled 2022-10-29: qty 1

## 2022-10-29 MED ORDER — ONDANSETRON HCL 4 MG/2ML IJ SOLN
4.0000 mg | Freq: Four times a day (QID) | INTRAMUSCULAR | Status: DC | PRN
  Administered 2022-10-30: 4 mg via INTRAVENOUS
  Filled 2022-10-29: qty 2

## 2022-10-29 MED ORDER — IOHEXOL 300 MG/ML  SOLN
100.0000 mL | Freq: Once | INTRAMUSCULAR | Status: AC | PRN
  Administered 2022-10-29: 100 mL via INTRAVENOUS

## 2022-10-29 MED ORDER — ONDANSETRON HCL 4 MG PO TABS: 4.0000 mg | ORAL_TABLET | Freq: Four times a day (QID) | ORAL | Status: DC | PRN

## 2022-10-29 NOTE — ED Triage Notes (Signed)
Pt with recent hernia repair on 2/21, today began to have emesis. Pain worse today.

## 2022-10-29 NOTE — ED Notes (Signed)
Date and time results received: 10/29/22 2345   Test: LACTIC Critical Value: 3.6  Name of Provider Notified: Zierle-Ghosh, DO

## 2022-10-29 NOTE — ED Provider Notes (Signed)
[facility] Provider Note  CSN: [id number] Arrival date & time: 10/29/22 2005  Chief Complaint(s) Post-op Problem  HPI Shaun [name] is a 67 y.o. male with PMH CAD, CHF, GERD, HTN, HLD, open incisional ventral hernia repair performed on 10/19/2022 who presents emergency department for evaluation of abdominal pain, nausea, vomiting.  Family states that patient had acute onset worsening of lower abdominal pain with emesis.  No bowel movements today.  Patient arrives in extremis with significant abdominal tenderness to palpation.  Denies chest pain, shortness of breath, headache, fever or other systemic symptoms.   Past Medical History Past Medical History:  Diagnosis Date   Anemia    Anginal pain ([place])    Anxiety    Asthma    CAD (coronary artery disease)    DES to distal circumflex 2016   Cataract    CHF (congestive heart failure) (HCC)    Colon polyps    30 colon polyps found on first colonoscopy   Depression    Diastolic heart failure (HCC)    Diverticulitis    DJD (degenerative joint disease)    Dyspnea    Dysrhythmia    A-Fib   GERD (gastroesophageal reflux disease)    History of kidney stones    Hyperlipidemia    Hypertension    Insomnia    Obstructive sleep apnea 12/2009   01/26/2010 AHI 83/hr   Permanent atrial fibrillation ([place])    Onset 2006 paroxysmal then progressive to persistent   Pneumonia    PUD (peptic ulcer disease)    1980s   RLS (restless legs syndrome)    Sinusitis    Skin cancer    Type 2 diabetes mellitus ([place])    Patient Active Problem List   Diagnosis Date Noted   Ventral incisional hernia without obstruction or gangrene 10/19/2022   Bacterial URI 08/29/2022   Thyroid dysfunction 05/31/2022   Current moderate episode of major depressive disorder without prior episode ([place]) 04/19/2022   Debility 10/21/2021   Sundowning 08/23/2021   Dysphagia 08/22/2021   Hypernatremia 08/22/2021   Obesity  (BMI 30-39.9) 08/21/2021   Ventral hernia 12/31/2020   Dyslipidemia, goal LDL below 70 06/18/2019   Hx of adenomatous colonic polyps 10/17/2018   Anemia 10/17/2018   Phimosis of penis 09/27/2018   Primary osteoarthritis of right hip 06/20/2018   GERD (gastroesophageal reflux disease) 05/21/2018   RLS (restless legs syndrome)    PUD (peptic ulcer disease)    Insomnia    Chronic diastolic CHF (congestive heart failure) ([place])    Asthma    Arteriosclerotic cardiovascular disease (ASCVD)    Chronic anticoagulation 01/27/2017   COPD (chronic obstructive pulmonary disease) ([place]) 07/20/2015   Accelerating angina ([place]) 07/19/2015   Genetic testing 09/09/2014   H/O adenomatous polyp of colon 08/11/2014   Non-insulin treated type 2 diabetes mellitus ([place]) 02/09/2011   CAD S/P percutaneous coronary angioplasty    Tobacco abuse    Essential hypertension    Chronic atrial fibrillation ([place]) 02/23/2010   Obstructive sleep apnea 12/27/2009   Home Medication(s) Prior to Admission medications   Medication Sig Start Date End Date Taking? Authorizing Provider  albuterol (VENTOLIN HFA) 108 (90 Base) MCG/ACT inhaler INHALE 2 PUFFS INTO THE LUNGS EVERY 6 HOURS AS NEEDED FOR WHEEZING OR SHORTNESS OF BREATH 08/01/21   Hawks, Alyse Low A, FNP  apixaban (ELIQUIS) 5 MG TABS tablet TAKE 1 TABLET (5 MG) BY MOUTH TWICE DAILY 02/14/22   Lelon Perla, MD  atorvastatin (LIPITOR) 80 MG tablet Take 1 tablet (80 mg total) by mouth daily. TAKE 1 TABLET(80 MG) BY MOUTH EVERY EVENING 06/23/22   Lelon Perla, MD  azelastine (ASTELIN) 0.1 % nasal spray Place 1 spray into both nostrils 2 (two) times daily. Use in each nostril as directed Patient taking differently: Place 1 spray into both nostrils daily as needed for allergies. Use in each nostril as directed 10/12/21   Maximiano Coss, NP  buPROPion (WELLBUTRIN XL) 150 MG 24 hr tablet Take 3 tablets once daily Patient taking differently: Take 150 mg by mouth 3 (three)  times daily. 07/29/22   Orma Render, NP  docusate sodium (COLACE) 100 MG capsule Take 1 capsule (100 mg total) by mouth daily as needed for mild constipation. Patient taking differently: Take 100 mg by mouth daily. 07/29/22   Orma Render, NP  ezetimibe (ZETIA) 10 MG tablet Take 1 tablet (10 mg total) by mouth daily. 07/28/22 07/28/23  Lelon Perla, MD  ferrous sulfate 325 (65 FE) MG tablet Take 325 mg by mouth daily with breakfast.    [provider]  furosemide (LASIX) 40 MG tablet Take 1 tablet (40 mg total) by mouth daily. 05/20/22   Loel Dubonnet, NP  HYDROcodone-acetaminophen (NORCO/VICODIN) 5-325 MG tablet Take 2 tablets by mouth 2 (two) times daily as needed for moderate pain. 10/28/22   Orma Render, NP  magnesium 30 MG tablet Take 30 mg by mouth in the morning.    [provider]  metFORMIN (GLUCOPHAGE) 500 MG tablet Take 1 tablet (500 mg total) by mouth 2 (two) times daily before a meal. 07/29/22   Early, Coralee Pesa, NP  Metoprolol Tartrate 75 MG TABS Take 75 mg by mouth 2 (two) times daily. 07/29/22   Orma Render, NP  nitroGLYCERIN (NITROSTAT) 0.4 MG SL tablet Place 1 tablet (0.4 mg total) under the tongue every 5 (five) minutes as needed. 07/29/22   Orma Render, NP  omeprazole (PRILOSEC) 40 MG capsule Take 1 capsule by mouth once daily 30 minutes before breakfast 01/31/22   Annitta Needs, NP  polyethylene glycol (MIRALAX / GLYCOLAX) 17 g packet Take 17 g by mouth daily as needed for mild constipation. 10/19/22   Dwan Bolt, MD  potassium chloride SA (KLOR-CON M) 20 MEQ tablet Take 1 tablet (20 mEq total) by mouth daily. 05/05/22   Lelon Perla, MD  rOPINIRole (REQUIP) 3 MG tablet Take 1 tab nightly for RLS and a 1/2 tab during the day to control breakthrough symptoms 07/29/22   Early, Coralee Pesa, NP  sildenafil (VIAGRA) 100 MG tablet TAKE 1 TABLET BY MOUTH 30 MINUTES BEFORE ACTIVITY Patient taking differently: Take 100 mg by mouth daily as needed for erectile  dysfunction.  [place] ACTIVITY 10/05/22   Early, Coralee Pesa, NP  sucralfate (CARAFATE) 1 g tablet Take one tablet po BID PRN Patient taking differently: Take 1 g by mouth daily. 01/08/20   Mikey Kirschner, MD  tiZANidine (ZANAFLEX) 4 MG tablet Take 1 tablet (4 mg total) by mouth 2 (two) times daily as needed for muscle spasms. Patient taking differently: Take 4 mg by mouth 2 (two) times daily. 10/05/22   Orma Render, NP  traZODone (DESYREL) 50 MG tablet TAKE 1 TABLET BY MOUTH EVERY NIGHT AT BEDTIME FOR 7 DAYS THEN TAKE 2 TABLETS BY MOUTH EVERY NIGHT AT BEDTIME Patient taking differently: Take 100 mg by mouth at bedtime. 06/27/22   Early,  Coralee Pesa, NP  triamcinolone ointment (KENALOG) 0.1 % Apply 1 application. topically 2 (two) times daily. Patient not taking: Reported on 10/14/2022 01/13/22 01/13/23  Midge Minium, MD                                                                                                                                    Past Surgical History Past Surgical History:  Procedure Laterality Date   BIOPSY  07/17/2018   Procedure: BIOPSY;  Surgeon: Danie Binder, MD;  [facility];  Service: Endoscopy;;  colon   BOWEL RESECTION  09/17/2018   SMALL BOWEL RESECTION: 71 CM    CARDIAC CATHETERIZATION N/A 07/21/2015   Procedure: Left Heart Cath and Coronary Angiography;  Surgeon: Peter M Martinique, MD;  Location: [facility];  Service: Cardiovascular;  Laterality: N/A;   CARDIAC CATHETERIZATION N/A 07/21/2015   Procedure: Coronary Stent Intervention;  Surgeon: Peter M Martinique, MD;  Location: [facility];  Service: Cardiovascular;  Laterality: N/A;   CIRCUMCISION N/A 04/05/2019   Procedure: CIRCUMCISION ADULT;  Surgeon: Irine Seal, MD;  Location: [facility];  Service: Urology;  Laterality: N/A;   COLONOSCOPY N/A 05/19/2014   Dr. Barnie Alderman diverticulosis/moderate external hemorrhoids, >20 simple adenomas. Genetic screening negative.    COLONOSCOPY WITH  PROPOFOL N/A 07/17/2018   Dr. Oneida Alar: Diverticulosis, external/internal hemorrhoids, 32 colon polyps removed.  ten tubular adenomas removed with no high-grade dysplasia.  Advised to have surveillance colonoscopy in 3 years.   COLONOSCOPY WITH PROPOFOL N/A 06/07/2021   Procedure: COLONOSCOPY WITH PROPOFOL;  Surgeon: Eloise Harman, DO;  [facility];  Service: Endoscopy;  Laterality: N/A;  9:30 / ASA 3  (Pt was told that his time will be given at Pre-op)   ESOPHAGOGASTRODUODENOSCOPY (EGD) WITH PROPOFOL N/A 07/17/2018   Dr. Oneida Alar: Low-grade narrowing Schatzki ring at the GE junction status post dilation.  Gastritis.  Biopsy with mild nonspecific reactive gastropathy.  No H. pylori.   GIVENS CAPSULE STUDY N/A 06/24/2019   normal   HERNIA REPAIR  1986   Left inguinal   INCISIONAL HERNIA REPAIR N/A 10/19/2022   Procedure: OPEN INCISIONAL HERNIA REPAIR;  Surgeon: Dwan Bolt, MD;  Location: [facility];  Service: General;  Laterality: N/A;   INSERTION OF MESH N/A 10/19/2022   Procedure: INSERTION OF MESH;  Surgeon: Dwan Bolt, MD;  Location: [facility];  Service: General;  Laterality: N/A;   INTRAVASCULAR PRESSURE WIRE/FFR STUDY Left 06/08/2017   Procedure: INTRAVASCULAR PRESSURE WIRE/FFR STUDY;  Surgeon: Nelva Bush, MD;  Location: [facility];  Service: Cardiovascular;  Laterality: Left;  LAD and CFX   LAPAROTOMY N/A 09/17/2018   Procedure: EXPLORATORY LAPAROTOMY;  Surgeon: Virl Cagey, MD;  Location: [facility];  Service: General;  Laterality: N/A;   LEFT HEART CATH AND CORONARY ANGIOGRAPHY N/A 06/08/2017   Procedure: LEFT HEART CATH AND CORONARY ANGIOGRAPHY;  Surgeon: End,  Harrell Gave, MD;  Location: [facility];  Service: Cardiovascular;  Laterality: N/A;   POLYPECTOMY  07/17/2018   Procedure: POLYPECTOMY;  Surgeon: Danie Binder, MD;  [facility];  Service: Endoscopy;;  colon   POLYPECTOMY  06/07/2021   Procedure: POLYPECTOMY INTESTINAL;  Surgeon:  Eloise Harman, DO;  [facility];  Service: Endoscopy;;   ROTATOR CUFF REPAIR     Right   SAVORY DILATION N/A 07/17/2018   Procedure: SAVORY DILATION;  Surgeon: Danie Binder, MD;  [facility];  Service: Endoscopy;  Laterality: N/A;   Family History Family History  Problem Relation Age of Onset   Hypertension Mother    Breast cancer Mother 35       brain/bone    Heart attack Father    Skin cancer Sister 2   Diabetes Brother    Parkinson's disease Brother    Brain cancer Maternal Uncle    Cancer Maternal Uncle        NOS   Breast cancer Cousin        maternal cousin dx <50   Cancer Cousin    Colon cancer Neg Hx     Social History Social History   Tobacco Use   Smoking status: Some Days    Packs/day: 0.50    Years: 39.00    Total pack years: 19.50    Types: Cigarettes    Start date: 07/24/1970    Last attempt to quit: 08/06/2021    Years since quitting: 1.2    Passive exposure: Past   Smokeless tobacco: Never  Vaping Use   Vaping Use: Never used  Substance Use Topics   Alcohol use: No    Alcohol/week: 0.0 standard drinks of alcohol   Drug use: No   Allergies Patient has no known allergies.  Review of Systems Review of Systems  Gastrointestinal:  Positive for abdominal pain, nausea and vomiting.    Physical Exam Vital Signs  I have reviewed the triage vital signs BP (!) 144/98 (BP Location: Right Arm)   Pulse 88   Temp 97.7 F (36.5 C) (Oral)   SpO2 98%   Physical Exam Constitutional:      General: He is in acute distress.     Appearance: Normal appearance. He is ill-appearing.  HENT:     Head: Normocephalic and atraumatic.     Nose: No congestion or rhinorrhea.  Eyes:     General:        Right eye: No discharge.        Left eye: No discharge.     Extraocular Movements: Extraocular movements intact.     Pupils: Pupils are equal, round, and reactive to light.  Cardiovascular:     Rate and Rhythm: Normal rate and  regular rhythm.     Heart sounds: No murmur heard. Pulmonary:     Effort: No respiratory distress.     Breath sounds: No wheezing or rales.  Abdominal:     General: There is no distension.     Tenderness: There is abdominal tenderness.  Musculoskeletal:        General: Normal range of motion.     Cervical back: Normal range of motion.  Skin:    General: Skin is warm and dry.  Neurological:     General: No focal deficit present.     Mental Status: He is alert.     ED Results and Treatments Labs (all labs ordered are listed, but only  abnormal results are displayed) Labs Reviewed  CBC WITH DIFFERENTIAL/PLATELET  COMPREHENSIVE METABOLIC PANEL  LIPASE, BLOOD  URINALYSIS, ROUTINE W REFLEX MICROSCOPIC  LACTIC ACID, PLASMA  LACTIC ACID, PLASMA  I-STAT CHEM 8, ED                                                                                                                          Radiology No results found.  Pertinent labs & imaging results that were available during my care of the patient were reviewed by me and considered in my medical decision making (see MDM for details).  Medications Ordered in ED Medications  HYDROmorphone (DILAUDID) injection 1 mg (has no administration in time range)  lactated ringers bolus 1,000 mL (has no administration in time range)  fentaNYL (SUBLIMAZE) injection 50 mcg (50 mcg Intravenous Given 10/29/22 2044)                                                                                                                                     Procedures .Critical Care  Performed by: Teressa Lower, MD Authorized by: Teressa Lower, MD   Critical care provider statement:    Critical care time (minutes):  30   Critical care was time spent personally by me on the following activities:  Development of treatment plan with patient or surrogate, discussions with consultants, evaluation of patient's response to treatment, examination of patient, ordering  and review of laboratory studies, ordering and review of radiographic studies, ordering and performing treatments and interventions, pulse oximetry, re-evaluation of patient's condition and review of old charts   (including critical care time)  Medical Decision Making / ED Course   This patient presents to the ED for concern of abdominal pain, vomiting, this involves an extensive number of treatment options, and is a complaint that carries with it a high risk of complications and morbidity.  The differential diagnosis includes SBO, surgical site abscess, hollow viscus perforation, diverticulitis, ischemic bowel  MDM: Patient seen emerged part for evaluation of abdominal pain.  Physical exam reveals a ill-appearing patient with significant tenderness to palpation over the abdomen, writhing in bed.  Initial laboratory evaluation with leukocytosis to [id number] with neutrophilic predominance, CO2 21, lactic acid elevated at 3.7, lipase normal.  CT abdomen pelvis showing gas and fluid collection within the anterior abdominal wall that is likely a postoperative seroma versus abscess.  There is also dilated  proximal and mid bowel small loops that could represent focal ileus or low-grade or early small bowel obstruction.  There is no evidence of erythema or pus coming from the surgical site and have lower suspicion for intra-abdominal abscess but I did speak with the [facility] surgery and on-call Dr. Georgette Dover who agrees that this is likely a postoperative seroma.  Recommending medical admission for bowel rest and fluid resuscitation but to hold off on NG tube unless the patient is persistently vomiting in the ER.  Recommending [facility] to [facility] or [facility] and surgical team will evaluate him at one of the sites depending on where there is a bed available.  Symptoms improved with Dilaudid and fentanyl but pain has not entirely resolved.  Patient then admitted   Additional history obtained: -Additional  history obtained from multiple family members -External records from outside source obtained and reviewed including: Chart review including previous notes, labs, imaging, consultation notes   Lab Tests: -I ordered, reviewed, and interpreted labs.   The pertinent results include:   Labs Reviewed  CBC WITH DIFFERENTIAL/PLATELET  COMPREHENSIVE METABOLIC PANEL  LIPASE, BLOOD  URINALYSIS, ROUTINE W REFLEX MICROSCOPIC  LACTIC ACID, PLASMA  LACTIC ACID, PLASMA  I-STAT CHEM 8, ED        Imaging Studies ordered: I ordered imaging studies including CT abdomen pelvis I independently visualized and interpreted imaging. I agree with the radiologist interpretation   Medicines ordered and prescription drug management: Meds ordered this encounter  Medications   fentaNYL (SUBLIMAZE) injection 50 mcg   HYDROmorphone (DILAUDID) injection 1 mg   lactated ringers bolus 1,000 mL    -I have reviewed the patients home medicines and have made adjustments as needed  Critical interventions IV fluids, surgical consultation, rapid CT scan  Consultations Obtained: I requested consultation with the general surgeon Dr. Georgette Dover,  and discussed lab and imaging findings as well as pertinent plan - they recommend: Bowel rest, medical admission and IV fluids   Cardiac Monitoring: The patient was maintained on a cardiac monitor.  I personally viewed and interpreted the cardiac monitored which showed an underlying rhythm of: Sinus tachycardia, NSR  Social Determinants of Health:  Factors impacting patients care include: none   Reevaluation: After the interventions noted above, I reevaluated the patient and found that they have :improved  Co morbidities that complicate the patient evaluation  Past Medical History:  Diagnosis Date   Anemia    Anginal pain ([place])    Anxiety    Asthma    CAD (coronary artery disease)    DES to distal circumflex 2016   Cataract    CHF (congestive heart failure) ([place])     Colon polyps    30 colon polyps found on first colonoscopy   Depression    Diastolic heart failure ([facility])    Diverticulitis    DJD (degenerative joint disease)    Dyspnea    Dysrhythmia    A-Fib   GERD (gastroesophageal reflux disease)    History of kidney stones    Hyperlipidemia    Hypertension    Insomnia    Obstructive sleep apnea 12/2009   01/26/2010 AHI 83/hr   Permanent atrial fibrillation ([place])    Onset 2006 paroxysmal then progressive to persistent   Pneumonia    PUD (peptic ulcer disease)    1980s   RLS (restless legs syndrome)    Sinusitis    Skin cancer    Type 2 diabetes mellitus ([place])  Dispostion: I considered admission for this patient, and due to developing early small bowel obstruction and persistent abdominal pain, patient require hospital admission     Final Clinical Impression(s) / ED Diagnoses Final diagnoses:  None     '@PCDICTATION'$ @    Teressa Lower, MD 10/29/22 2249

## 2022-10-30 ENCOUNTER — Encounter (HOSPITAL_COMMUNITY): Payer: Self-pay | Admitting: Family Medicine

## 2022-10-30 DIAGNOSIS — I482 Chronic atrial fibrillation, unspecified: Secondary | ICD-10-CM | POA: Diagnosis not present

## 2022-10-30 DIAGNOSIS — K56609 Unspecified intestinal obstruction, unspecified as to partial versus complete obstruction: Secondary | ICD-10-CM | POA: Diagnosis not present

## 2022-10-30 DIAGNOSIS — F17200 Nicotine dependence, unspecified, uncomplicated: Secondary | ICD-10-CM

## 2022-10-30 DIAGNOSIS — E119 Type 2 diabetes mellitus without complications: Secondary | ICD-10-CM

## 2022-10-30 DIAGNOSIS — I2510 Atherosclerotic heart disease of native coronary artery without angina pectoris: Secondary | ICD-10-CM | POA: Diagnosis not present

## 2022-10-30 DIAGNOSIS — I10 Essential (primary) hypertension: Secondary | ICD-10-CM

## 2022-10-30 DIAGNOSIS — K219 Gastro-esophageal reflux disease without esophagitis: Secondary | ICD-10-CM

## 2022-10-30 DIAGNOSIS — Z9861 Coronary angioplasty status: Secondary | ICD-10-CM

## 2022-10-30 DIAGNOSIS — E785 Hyperlipidemia, unspecified: Secondary | ICD-10-CM | POA: Diagnosis not present

## 2022-10-30 DIAGNOSIS — G2581 Restless legs syndrome: Secondary | ICD-10-CM

## 2022-10-30 LAB — CBC WITH DIFFERENTIAL/PLATELET
Abs Immature Granulocytes: 0.05 10*3/uL (ref 0.00–0.07)
Basophils Absolute: 0 10*3/uL (ref 0.0–0.1)
Basophils Relative: 0 %
Eosinophils Absolute: 0.2 10*3/uL (ref 0.0–0.5)
Eosinophils Relative: 2 %
HCT: 37.8 % — ABNORMAL LOW (ref 39.0–52.0)
Hemoglobin: 12.6 g/dL — ABNORMAL LOW (ref 13.0–17.0)
Immature Granulocytes: 1 %
Lymphocytes Relative: 20 %
Lymphs Abs: 2.1 10*3/uL (ref 0.7–4.0)
MCH: 27.8 pg (ref 26.0–34.0)
MCHC: 33.3 g/dL (ref 30.0–36.0)
MCV: 83.4 fL (ref 80.0–100.0)
Monocytes Absolute: 0.6 10*3/uL (ref 0.1–1.0)
Monocytes Relative: 6 %
Neutro Abs: 7.6 10*3/uL (ref 1.7–7.7)
Neutrophils Relative %: 71 %
Platelets: 189 10*3/uL (ref 150–400)
RBC: 4.53 MIL/uL (ref 4.22–5.81)
RDW: 13.2 % (ref 11.5–15.5)
WBC: 10.5 10*3/uL (ref 4.0–10.5)
nRBC: 0 % (ref 0.0–0.2)

## 2022-10-30 LAB — COMPREHENSIVE METABOLIC PANEL
ALT: 29 U/L (ref 0–44)
AST: 22 U/L (ref 15–41)
Albumin: 3.1 g/dL — ABNORMAL LOW (ref 3.5–5.0)
Alkaline Phosphatase: 74 U/L (ref 38–126)
Anion gap: 11 (ref 5–15)
BUN: 15 mg/dL (ref 8–23)
CO2: 25 mmol/L (ref 22–32)
Calcium: 8.6 mg/dL — ABNORMAL LOW (ref 8.9–10.3)
Chloride: 101 mmol/L (ref 98–111)
Creatinine, Ser: 0.86 mg/dL (ref 0.61–1.24)
GFR, Estimated: >60 mL/min (ref >60)
Glucose, Bld: 116 mg/dL — ABNORMAL HIGH (ref 70–99)
Potassium: 4.1 mmol/L (ref 3.5–5.1)
Sodium: 137 mmol/L (ref 135–145)
Total Bilirubin: 0.7 mg/dL (ref 0.3–1.2)
Total Protein: 5.5 g/dL — ABNORMAL LOW (ref 6.5–8.1)

## 2022-10-30 LAB — URINALYSIS, ROUTINE W REFLEX MICROSCOPIC
Bilirubin Urine: NEGATIVE
Glucose, UA: NEGATIVE mg/dL
Hgb urine dipstick: NEGATIVE
Ketones, ur: NEGATIVE mg/dL
Leukocytes,Ua: NEGATIVE
Nitrite: NEGATIVE
Protein, ur: NEGATIVE mg/dL
Specific Gravity, Urine: >1.046 — ABNORMAL HIGH (ref 1.005–1.030)
pH: 5 (ref 5.0–8.0)

## 2022-10-30 LAB — LACTIC ACID, PLASMA
Lactic Acid, Venous: 2.3 mmol/L (ref 0.5–1.9)
Lactic Acid, Venous: 1.8 mmol/L (ref 0.5–1.9)

## 2022-10-30 LAB — HIV ANTIBODY (ROUTINE TESTING W REFLEX): HIV Screen 4th Generation wRfx: NONREACTIVE

## 2022-10-30 LAB — MAGNESIUM: Magnesium: 1.7 mg/dL (ref 1.7–2.4)

## 2022-10-30 MED ORDER — ACETAMINOPHEN 325 MG PO TABS: 650.0000 mg | ORAL_TABLET | Freq: Four times a day (QID) | ORAL | Status: DC | PRN

## 2022-10-30 MED ORDER — ATORVASTATIN CALCIUM 80 MG PO TABS
80.0000 mg | ORAL_TABLET | Freq: Every day | ORAL | Status: DC
  Administered 2022-10-30 – 2022-10-31 (×2): 80 mg via ORAL
  Filled 2022-10-30 (×2): qty 1

## 2022-10-30 MED ORDER — ROPINIROLE HCL 1 MG PO TABS
3.0000 mg | ORAL_TABLET | ORAL | Status: DC
  Administered 2022-10-30: 3 mg via ORAL
  Filled 2022-10-30: qty 3

## 2022-10-30 MED ORDER — NITROGLYCERIN 0.4 MG SL SUBL: 0.4000 mg | SUBLINGUAL_TABLET | SUBLINGUAL | Status: DC | PRN

## 2022-10-30 MED ORDER — BUPROPION HCL ER (XL) 150 MG PO TB24
450.0000 mg | ORAL_TABLET | Freq: Every day | ORAL | Status: DC
  Administered 2022-10-30 – 2022-10-31 (×2): 450 mg via ORAL
  Filled 2022-10-30 (×2): qty 3

## 2022-10-30 MED ORDER — ALBUTEROL SULFATE (2.5 MG/3ML) 0.083% IN NEBU: 2.5000 mg | INHALATION_SOLUTION | Freq: Four times a day (QID) | RESPIRATORY_TRACT | Status: DC | PRN

## 2022-10-30 MED ORDER — MAGNESIUM SULFATE IN D5W 1-5 GM/100ML-% IV SOLN
1.0000 g | Freq: Once | INTRAVENOUS | Status: AC
  Administered 2022-10-30: 1 g via INTRAVENOUS
  Filled 2022-10-30: qty 100

## 2022-10-30 MED ORDER — METOPROLOL TARTRATE 25 MG PO TABS
75.0000 mg | ORAL_TABLET | Freq: Two times a day (BID) | ORAL | Status: DC
  Administered 2022-10-30 – 2022-10-31 (×3): 75 mg via ORAL
  Filled 2022-10-30 (×3): qty 3

## 2022-10-30 MED ORDER — HEPARIN SODIUM (PORCINE) 5000 UNIT/ML IJ SOLN
5000.0000 [IU] | Freq: Three times a day (TID) | INTRAMUSCULAR | Status: DC
  Administered 2022-10-30 – 2022-10-31 (×3): 5000 [IU] via SUBCUTANEOUS
  Filled 2022-10-30 (×3): qty 1

## 2022-10-30 MED ORDER — METOPROLOL TARTRATE 5 MG/5ML IV SOLN
2.5000 mg | Freq: Four times a day (QID) | INTRAVENOUS | Status: DC
  Administered 2022-10-30: 2.5 mg via INTRAVENOUS
  Filled 2022-10-30: qty 5

## 2022-10-30 MED ORDER — AZELASTINE HCL 0.1 % NA SOLN
1.0000 | Freq: Two times a day (BID) | NASAL | Status: DC
  Administered 2022-10-30 – 2022-10-31 (×2): 1 via NASAL
  Filled 2022-10-30: qty 30

## 2022-10-30 MED ORDER — TIZANIDINE HCL 4 MG PO TABS: 4.0000 mg | ORAL_TABLET | Freq: Two times a day (BID) | ORAL | Status: DC | PRN

## 2022-10-30 MED ORDER — PANTOPRAZOLE SODIUM 40 MG IV SOLR
40.0000 mg | Freq: Two times a day (BID) | INTRAVENOUS | Status: DC
  Administered 2022-10-30 – 2022-10-31 (×4): 40 mg via INTRAVENOUS
  Filled 2022-10-30 (×4): qty 10

## 2022-10-30 MED ORDER — TRAZODONE HCL 50 MG PO TABS
100.0000 mg | ORAL_TABLET | Freq: Every day | ORAL | Status: DC
  Administered 2022-10-30: 100 mg via ORAL
  Filled 2022-10-30: qty 2

## 2022-10-30 MED ORDER — ACETAMINOPHEN 650 MG RE SUPP: 650.0000 mg | Freq: Four times a day (QID) | RECTAL | Status: DC | PRN

## 2022-10-30 NOTE — Assessment & Plan Note (Signed)
-   Holding metformin - Glucose well-controlled in the ED with a reading of 139 - If glucose starts to trend upwards consider adding sliding scale coverage - Patient is currently n.p.o.

## 2022-10-30 NOTE — Assessment & Plan Note (Signed)
-   Patient currently is n.p.o. - Resume statin, Zetia, metoprolol when patient is able to take p.o.

## 2022-10-30 NOTE — Assessment & Plan Note (Signed)
>>  ASSESSMENT AND PLAN FOR TOBACCO USE DISORDER WRITTEN ON 10/30/2022 12:14 AM BY ZIERLE-GHOSH, ASIA B, DO  - Patient did not require nicotine patches last hospitalization per his report. - Counseled on importance of cessation - Advised to ask for nicotine patch should give.

## 2022-10-30 NOTE — Assessment & Plan Note (Signed)
-   Resume metoprolol when patient is able to tolerate p.o.

## 2022-10-30 NOTE — Assessment & Plan Note (Signed)
-   Continue with IV PPI

## 2022-10-30 NOTE — Assessment & Plan Note (Signed)
>>  ASSESSMENT AND PLAN FOR GERD (GASTROESOPHAGEAL REFLUX DISEASE) WRITTEN ON 10/30/2022 12:11 AM BY ZIERLE-GHOSH, ASIA B, DO  - Continue with IV PPI

## 2022-10-30 NOTE — Progress Notes (Signed)
Progress Note     Subjective: Patient is a 67 year old male who underwent ventral hernia repair with mesh 10/19/22 by Dr. Zenia Resides. He was discharged home POD1. He presented to the ED yesterday with abdominal pain and nausea/vomiting. He reports to me that he was struggling with some constipation since being home really. He was taking colace and miralax. He was having some bowel function with last BM on 3/2 around 8 AM. No dark or bloody stools. Came in with concern for crampy lower abdominal pain. No fever or chills and patient denies redness over incision or drainage from incision.   Since admission patient has had 2 BMs and is passing more flatus. Nausea is improved this AM and pain is much better as well.   Objective: Vital signs in last 24 hours: Temp:  [97.7 F (36.5 C)-98 F (36.7 C)] 97.9 F (36.6 C) (03/03 0336) Pulse Rate:  [83-93] 89 (03/03 0336) Resp:  [15-19] 19 (03/03 0336) BP: (124-145)/(67-98) 145/82 (03/03 0336) SpO2:  [92 %-100 %] 97 % (03/03 0336) Weight:  [101.7 kg] 101.7 kg (03/03 0104) Last BM Date : 10/30/22  Intake/Output from previous day: 03/02 0701 - 03/03 0700 In: 1920 [P.O.:120; I.V.:800; IV Piggyback:1000] Out: 0  Intake/Output this shift: Total I/O In: -  Out: 1 [Stool:1]  PE: General: pleasant, WD, overweight male who is laying in bed in NAD Heart: regular, rate, and rhythm.   Lungs: CTAB, no wheezes, rhonchi, or rales noted.  Respiratory effort nonlabored Abd: soft, NT, mild distention, +BS, incision is C/D/I with no erythema or drainage MS: all 4 extremities are symmetrical with no cyanosis, clubbing, or edema. Skin: warm and dry with no masses, lesions, or rashes Psych: A&Ox3 with an appropriate affect.    Lab Results:  Recent Labs    10/29/22 2127 10/30/22 0312  WBC 12.3* 10.5  HGB 13.4 12.6*  HCT 41.1 37.8*  PLT 217 189   BMET Recent Labs    10/29/22 2127 10/30/22 0312  NA 134* 137  K 4.1 4.1  CL 102 101  CO2 21* 25   GLUCOSE 139* 116*  BUN 19 15  CREATININE 0.90 0.86  CALCIUM 8.6* 8.6*   PT/INR No results for input(s): "LABPROT", "INR" in the last 72 hours. CMP     Component Value Date/Time   NA 137 10/30/2022 0312   NA 144 06/28/2021 0845   K 4.1 10/30/2022 0312   CL 101 10/30/2022 0312   CO2 25 10/30/2022 0312   GLUCOSE 116 (H) 10/30/2022 0312   BUN 15 10/30/2022 0312   BUN 15 06/28/2021 0845   CREATININE 0.86 10/30/2022 0312   CREATININE 0.92 12/15/2017 0744   CALCIUM 8.6 (L) 10/30/2022 0312   PROT 5.5 (L) 10/30/2022 0312   PROT 6.0 06/28/2021 0845   ALBUMIN 3.1 (L) 10/30/2022 0312   ALBUMIN 3.8 06/28/2021 0845   AST 22 10/30/2022 0312   ALT 29 10/30/2022 0312   ALKPHOS 74 10/30/2022 0312   BILITOT 0.7 10/30/2022 0312   BILITOT 0.5 06/28/2021 0845   GFRNONAA >60 10/30/2022 0312   GFRNONAA 89 12/15/2017 0744   GFRAA 98 01/01/2020 0758   GFRAA 104 12/15/2017 0744   Lipase     Component Value Date/Time   LIPASE 23 10/29/2022 2127       Studies/Results: CT ABDOMEN PELVIS W CONTRAST  Result Date: 10/29/2022 CLINICAL DATA:  Bowel obstruction suspected. Recent hernia repair. Vomiting. Abdominal pain EXAM: CT ABDOMEN AND PELVIS WITH CONTRAST TECHNIQUE: Multidetector CT  imaging of the abdomen and pelvis was performed using the standard protocol following bolus administration of intravenous contrast. RADIATION DOSE REDUCTION: This exam was performed according to the departmental dose-optimization program which includes automated exposure control, adjustment of the mA and/or kV according to patient size and/or use of iterative reconstruction technique. CONTRAST:  165m OMNIPAQUE IOHEXOL 300 MG/ML  SOLN COMPARISON:  03/22/2021 FINDINGS: Lower chest: No acute abnormality. Coronary artery and aortic calcifications. Hepatobiliary: Small layering gallstones within the gallbladder. 5 cm cyst in the left hepatic lobe, stable since prior study. Other scattered smaller cysts. No biliary ductal  dilatation. Pancreas: No focal abnormality or ductal dilatation. Spleen: No focal abnormality.  Normal size. Adrenals/Urinary Tract: Adrenal glands normal. 4.4 cm upper pole left renal cyst. Other smaller scattered cysts in the kidneys bilaterally. These are unchanged. No follow-up imaging recommended. No hydronephrosis. Urinary bladder unremarkable. Stomach/Bowel: Diffuse colonic diverticulosis. No active diverticulitis. Postoperative changes within the mid small bowel unchanged since prior study. There are mildly prominent proximal small bowel loops. Interval reduction of previously seen herniated small bowel loops within the ventral hernia. Vascular/Lymphatic: Aortic atherosclerosis. No evidence of aneurysm or adenopathy. Reproductive: No visible focal abnormality. Other: No free fluid or free air. Musculoskeletal: Interval ventral hernia repair. Fluid collection in the anterior abdominal wall measures 5.7 x 2.5 cm likely postoperative fluid collection. This contains air. This could reflect seroma or abscess. Bilateral inguinal hernias are unchanged. No acute bony abnormality. IMPRESSION: Interval reduction of ventral hernia containing small bowel loops and repair. Gas and fluid collection within the anterior abdominal wall measures 5.7 x 2.5 cm and could reflect postoperative seroma or abscess. Mildly dilated proximal and mid small bowel loops without well-defined transition. Distal small bowel loops are decompressed. This could reflect focal ileus related to manipulation of previously seen herniated small bowel loops, or low-grade early small bowel obstruction. Diffuse colonic diverticulosis. Coronary artery disease, aortic atherosclerosis. Cholelithiasis. Electronically Signed   By: KRolm BaptiseM.D.   On: 10/29/2022 22:20    Anti-infectives: Anti-infectives (From admission, onward)    None        Assessment/Plan S/P ventral hernia repair with mesh 10/19/22 Dr. AZenia ResidesIleus vs PSBO Abdominal wall  fluid collection  - CT yesterday with above findings, abdominal wall collection measures 5.7 x 2.5 cm - would not recommend aspiration currently with resolution in WBC and no overlying cellulitis, will confirm with MD but suspect seroma rather than abscess - suspect ileus rather than SBO - pt now having bowel function, ok to try CLD - mobilize - keep K >4.0 and Mg > 2.0  - bowel regimen - abdominal binder for support  - repeat labs in AM  FEN: CLD, IVF per [facility] VTE: SQH ID: no current abx  LOS: 1 day   I reviewed ED provider notes, hospitalist notes, last 24 h vitals and pain scores, last 48 h intake and output, last 24 h labs and trends, and last 24 h imaging results.     KNorm Parcel PNorthern [facility] Hospital[facility] 10/30/2022, 9:03 AM Please see Amion for pager number during day hours 7:00am-4:30pm

## 2022-10-30 NOTE — Assessment & Plan Note (Signed)
-   Resume Zetia and statin when patient is able to tolerate p.o.

## 2022-10-30 NOTE — Progress Notes (Signed)
Explained to pt reason for transfer, pt transferred to [facility], report handed over to RN, spouse POlin Hauserinformed of the transfer.

## 2022-10-30 NOTE — Assessment & Plan Note (Signed)
-   Resume Requip when patient is able to tolerate p.o.

## 2022-10-30 NOTE — H&P (Signed)
History and Physical    Patient: Shaun [name] [id number] DOB: Nov 26, 1955 DOA: 10/29/2022 DOS: the patient was seen and examined on 10/30/2022 PCP: Orma Render, NP  Patient coming from: Home  Chief Complaint:  Chief Complaint  Patient presents with   Post-op Problem   HPI: Shaun [name] is a 67 y.o. male with medical history significant of chronic atrial fibrillation, coronary artery disease, CHF, depression, GERD, restless leg syndrome, type 2 diabetes mellitus, and will see ED for abdominal pain.  Patient recently had ventral hernia repair with mesh on 2/21.  Reports that today around 2:30 PM he had gradual onset of abdominal pain.  At first it was crampy, and then it was intensely crampy and stabbing.  The pain was constant but it did wax and wane.  Patient had some nausea and vomiting without hematemesis.  He reports that his last meal was the night prior.  He did have a snack around 10 AM on 3/2.  His last bowel movement was at 8 AM on 3/2, the 1 before that was at 2:30 in the morning on 3/2.  Patient denies any hematochezia or melena.  Patient's pain was constant until he was given Dilaudid in the ER.  The only other thing noted to help his pain, but not offer relief was 2 flex at the hips to take some of the pressure off of his abdomen.  Patient feels like his abdomen is distended.  He does have an abdominal binder on.  Patient reports that when his pain was at its most intense he did have some shortness of breath and palpitations with it.  He never felt dizzy.  He did not have chest pain.  Patient denies any rashes or sores on his body.  He has no other complaints at this time.  Patient smokes half a pack per day.  He does not drink alcohol.  He does not use illicit drugs.  He is vaccinated for flu.  Patient is full code. Review of Systems: As mentioned in the history of present illness. All other systems reviewed and are negative. Past Medical History:  Diagnosis Date    Anemia    Anginal pain ([place])    Anxiety    Asthma    CAD (coronary artery disease)    DES to distal circumflex 2016   Cataract    CHF (congestive heart failure) (HCC)    Colon polyps    30 colon polyps found on first colonoscopy   Depression    Diastolic heart failure (HCC)    Diverticulitis    DJD (degenerative joint disease)    Dyspnea    Dysrhythmia    A-Fib   GERD (gastroesophageal reflux disease)    History of kidney stones    Hyperlipidemia    Hypertension    Insomnia    Obstructive sleep apnea 12/2009   01/26/2010 AHI 83/hr   Permanent atrial fibrillation ([place])    Onset 2006 paroxysmal then progressive to persistent   Pneumonia    PUD (peptic ulcer disease)    1980s   RLS (restless legs syndrome)    Sinusitis    Skin cancer    Type 2 diabetes mellitus ([place])    Past Surgical History:  Procedure Laterality Date   BIOPSY  07/17/2018   Procedure: BIOPSY;  Surgeon: Danie Binder, MD;  [facility];  Service: Endoscopy;;  colon   BOWEL RESECTION  09/17/2018   SMALL BOWEL RESECTION: 71 CM  CARDIAC CATHETERIZATION N/A 07/21/2015   Procedure: Left Heart Cath and Coronary Angiography;  Surgeon: Peter M Martinique, MD;  Location: [facility];  Service: Cardiovascular;  Laterality: N/A;   CARDIAC CATHETERIZATION N/A 07/21/2015   Procedure: Coronary Stent Intervention;  Surgeon: Peter M Martinique, MD;  Location: [facility];  Service: Cardiovascular;  Laterality: N/A;   CIRCUMCISION N/A 04/05/2019   Procedure: CIRCUMCISION ADULT;  Surgeon: Irine Seal, MD;  Location: [facility];  Service: Urology;  Laterality: N/A;   COLONOSCOPY N/A 05/19/2014   Dr. Barnie Alderman diverticulosis/moderate external hemorrhoids, >20 simple adenomas. Genetic screening negative.    COLONOSCOPY WITH PROPOFOL N/A 07/17/2018   Dr. Oneida Alar: Diverticulosis, external/internal hemorrhoids, 32 colon polyps removed.  ten tubular adenomas removed with no high-grade dysplasia.  Advised to have  surveillance colonoscopy in 3 years.   COLONOSCOPY WITH PROPOFOL N/A 06/07/2021   Procedure: COLONOSCOPY WITH PROPOFOL;  Surgeon: Eloise Harman, DO;  [facility];  Service: Endoscopy;  Laterality: N/A;  9:30 / ASA 3  (Pt was told that his time will be given at Pre-op)   ESOPHAGOGASTRODUODENOSCOPY (EGD) WITH PROPOFOL N/A 07/17/2018   Dr. Oneida Alar: Low-grade narrowing Schatzki ring at the GE junction status post dilation.  Gastritis.  Biopsy with mild nonspecific reactive gastropathy.  No H. pylori.   GIVENS CAPSULE STUDY N/A 06/24/2019   normal   HERNIA REPAIR  1986   Left inguinal   INCISIONAL HERNIA REPAIR N/A 10/19/2022   Procedure: OPEN INCISIONAL HERNIA REPAIR;  Surgeon: Dwan Bolt, MD;  Location: [facility];  Service: General;  Laterality: N/A;   INSERTION OF MESH N/A 10/19/2022   Procedure: INSERTION OF MESH;  Surgeon: Dwan Bolt, MD;  Location: [facility];  Service: General;  Laterality: N/A;   INTRAVASCULAR PRESSURE WIRE/FFR STUDY Left 06/08/2017   Procedure: INTRAVASCULAR PRESSURE WIRE/FFR STUDY;  Surgeon: Nelva Bush, MD;  Location: [facility];  Service: Cardiovascular;  Laterality: Left;  LAD and CFX   LAPAROTOMY N/A 09/17/2018   Procedure: EXPLORATORY LAPAROTOMY;  Surgeon: Virl Cagey, MD;  Location: [facility];  Service: General;  Laterality: N/A;   LEFT HEART CATH AND CORONARY ANGIOGRAPHY N/A 06/08/2017   Procedure: LEFT HEART CATH AND CORONARY ANGIOGRAPHY;  Surgeon: Nelva Bush, MD;  Location: [facility];  Service: Cardiovascular;  Laterality: N/A;   POLYPECTOMY  07/17/2018   Procedure: POLYPECTOMY;  Surgeon: Danie Binder, MD;  [facility];  Service: Endoscopy;;  colon   POLYPECTOMY  06/07/2021   Procedure: POLYPECTOMY INTESTINAL;  Surgeon: Eloise Harman, DO;  [facility];  Service: Endoscopy;;   ROTATOR CUFF REPAIR     Right   SAVORY DILATION N/A 07/17/2018   Procedure: SAVORY DILATION;  Surgeon: Danie Binder, MD;  [facility];  Service: Endoscopy;  Laterality: N/A;   Social History:  reports that he has been smoking cigarettes. He started smoking about 52 years ago. He has a 19.50 pack-year smoking history. He has been exposed to tobacco smoke. He has never used smokeless tobacco. He reports that he does not drink alcohol and does not use drugs.  No Known Allergies  Family History  Problem Relation Age of Onset   Hypertension Mother    Breast cancer Mother 7       brain/bone    Heart attack Father    Skin cancer Sister 39   Diabetes Brother    Parkinson's disease Brother    Brain  cancer Maternal Uncle    Cancer Maternal Uncle        NOS   Breast cancer Cousin        maternal cousin dx <50   Cancer Cousin    Colon cancer Neg Hx     Prior to Admission medications   Medication Sig Start Date End Date Taking? Authorizing Provider  albuterol (VENTOLIN HFA) 108 (90 Base) MCG/ACT inhaler INHALE 2 PUFFS INTO THE LUNGS EVERY 6 HOURS AS NEEDED FOR WHEEZING OR SHORTNESS OF BREATH 08/01/21   Hawks, Alyse Low A, FNP  apixaban (ELIQUIS) 5 MG TABS tablet TAKE 1 TABLET (5 MG) BY MOUTH TWICE DAILY 02/14/22   Lelon Perla, MD  atorvastatin (LIPITOR) 80 MG tablet Take 1 tablet (80 mg total) by mouth daily. TAKE 1 TABLET(80 MG) BY MOUTH EVERY EVENING 06/23/22   Lelon Perla, MD  azelastine (ASTELIN) 0.1 % nasal spray Place 1 spray into both nostrils 2 (two) times daily. Use in each nostril as directed Patient taking differently: Place 1 spray into both nostrils daily as needed for allergies. Use in each nostril as directed 10/12/21   Maximiano Coss, NP  buPROPion (WELLBUTRIN XL) 150 MG 24 hr tablet Take 3 tablets once daily Patient taking differently: Take 150 mg by mouth 3 (three) times daily. 07/29/22   Orma Render, NP  docusate sodium (COLACE) 100 MG capsule Take 1 capsule (100 mg total) by mouth daily as needed for mild constipation. Patient taking differently: Take 100 mg  by mouth daily. 07/29/22   Orma Render, NP  ezetimibe (ZETIA) 10 MG tablet Take 1 tablet (10 mg total) by mouth daily. 07/28/22 07/28/23  Lelon Perla, MD  ferrous sulfate 325 (65 FE) MG tablet Take 325 mg by mouth daily with breakfast.    [provider]  furosemide (LASIX) 40 MG tablet Take 1 tablet (40 mg total) by mouth daily. 05/20/22   Loel Dubonnet, NP  HYDROcodone-acetaminophen (NORCO/VICODIN) 5-325 MG tablet Take 2 tablets by mouth 2 (two) times daily as needed for moderate pain. 10/28/22   Orma Render, NP  magnesium 30 MG tablet Take 30 mg by mouth in the morning.    [provider]  metFORMIN (GLUCOPHAGE) 500 MG tablet Take 1 tablet (500 mg total) by mouth 2 (two) times daily before a meal. 07/29/22   Early, Coralee Pesa, NP  Metoprolol Tartrate 75 MG TABS Take 75 mg by mouth 2 (two) times daily. 07/29/22   Orma Render, NP  nitroGLYCERIN (NITROSTAT) 0.4 MG SL tablet Place 1 tablet (0.4 mg total) under the tongue every 5 (five) minutes as needed. 07/29/22   Orma Render, NP  omeprazole (PRILOSEC) 40 MG capsule Take 1 capsule by mouth once daily 30 minutes before breakfast 01/31/22   Annitta Needs, NP  polyethylene glycol (MIRALAX / GLYCOLAX) 17 g packet Take 17 g by mouth daily as needed for mild constipation. 10/19/22   Dwan Bolt, MD  potassium chloride SA (KLOR-CON M) 20 MEQ tablet Take 1 tablet (20 mEq total) by mouth daily. 05/05/22   Lelon Perla, MD  rOPINIRole (REQUIP) 3 MG tablet Take 1 tab nightly for RLS and a 1/2 tab during the day to control breakthrough symptoms 07/29/22   Early, Coralee Pesa, NP  sildenafil (VIAGRA) 100 MG tablet TAKE 1 TABLET BY MOUTH 30 MINUTES BEFORE ACTIVITY Patient taking differently: Take 100 mg by mouth daily as needed for erectile dysfunction.  [place]  10/05/22   Orma Render, NP  sucralfate (CARAFATE) 1 g tablet Take one tablet po BID PRN Patient taking differently: Take 1 g by mouth daily. 01/08/20   Mikey Kirschner, MD  tiZANidine (ZANAFLEX) 4 MG tablet Take 1 tablet (4 mg total) by mouth 2 (two) times daily as needed for muscle spasms. Patient taking differently: Take 4 mg by mouth 2 (two) times daily. 10/05/22   Orma Render, NP  traZODone (DESYREL) 50 MG tablet TAKE 1 TABLET BY MOUTH EVERY NIGHT AT BEDTIME FOR 7 DAYS THEN TAKE 2 TABLETS BY MOUTH EVERY NIGHT AT BEDTIME Patient taking differently: Take 100 mg by mouth at bedtime. 06/27/22   Orma Render, NP  triamcinolone ointment (KENALOG) 0.1 % Apply 1 application. topically 2 (two) times daily. Patient not taking: Reported on 10/14/2022 01/13/22 01/13/23  Midge Minium, MD    Physical Exam: Vitals:   10/29/22 2115 10/29/22 2130 10/29/22 2300 10/29/22 2330  BP: 131/76 124/67 139/77 (!) 143/84  Pulse: 93 83 86 91  Resp: '15 16 15 18  '$ Temp:      TempSrc:      SpO2: 93% 92% 94% 94%   1.  General: Patient lying supine in bed, hips and knees flexed to take pressure off of his abdomen   2. Psychiatric: Alert and oriented x 3, mood and behavior normal for situation, pleasant and cooperative with exam   3. Neurologic: Speech and language are normal, face is symmetric, moves all 4 extremities voluntarily, at baseline without acute deficits on limited exam   4. HEENMT:  Head is atraumatic, normocephalic, pupils reactive to light, neck is supple, trachea is midline, mucous membranes are moist   5. Respiratory : Lungs are clear to auscultation bilaterally without wheezing, rhonchi, rales, no cyanosis, no increase in work of breathing or accessory muscle use   6. Cardiovascular : Heart rate normal, rhythm is irregular, no murmurs, rubs or gallops, trace peripheral edema, peripheral pulses palpated   7. Gastrointestinal:  Abdomen is in an abdominal binder, abdomen is soft, diffusely tender to palpation, no masses or organomegaly palpated   8. Skin:  Skin is warm, dry and intact without rashes, acute lesions, or ulcers on limited exam    9.Musculoskeletal:  No acute deformities or trauma, no asymmetry in tone, trace peripheral edema, peripheral pulses palpated, no tenderness to palpation in the extremities  Data Reviewed: In the ED Temp 97.7, heart rate 88, blood pressure 144/98, satting at 98% Leukocytosis is likely secondary to SBO is 12.3 Hemoglobin 13.4, platelets 217 Chemistry is unremarkable Lactic acid 3.7 CT abdomen shows reduction of ventral hernia containing small bowel loops and repair.  Gas and fluid collection at the incision that could be an abscess or seroma.  Developing SBO. Patient was given 1 L bolus of LR LR was continued at 100 mL/h but increased to 125 mL/h at admission due to unchanged lactic acid Patient received Dilaudid and fentanyl in the ED [place] surgery was consulted and recommended admission to either [facility] or [facility] and no NG tube unless patient started throwing up Admission requested for postop SBO  Assessment and Plan: * SBO (small bowel obstruction) ([place]) - Patient recently had surgery on February 21-ventral incisional hernia repair with mesh - Today worsening abdominal pain - CT abdomen shows a gas and fluid collection that is 5.7 x 2.5 at the surgical site that could be an abscess versus seroma - There is also mildly dilated proximal and  mid small bowel loops without a well-defined transition likely to be developing SBO - [place] surgery was consulted and recommends admission to [facility] or [facility], no NG tube unless patient starts vomiting - N.p.o. - Pain control - Order to notify [place] surgery when patient arrives at [facility]  Dyslipidemia, goal LDL below 70 - Resume Zetia and statin when patient is able to tolerate p.o.  GERD (gastroesophageal reflux disease) - Continue with IV PPI  RLS (restless legs syndrome) - Resume Requip when patient is able to tolerate p.o.  Non-insulin treated type 2 diabetes mellitus (HCC) - Holding metformin - Glucose  well-controlled in the ED with a reading of 139 - If glucose starts to trend upwards consider adding sliding scale coverage - Patient is currently n.p.o.  Essential hypertension - Resume metoprolol when patient is able to tolerate p.o.  Tobacco use disorder - Patient did not require nicotine patches last hospitalization per his report. - Counseled on importance of cessation - Advised to ask for nicotine patch should give.  CAD S/P percutaneous coronary angioplasty - Patient currently is n.p.o. - Resume statin, Zetia, metoprolol when patient is able to take p.o.  Chronic atrial fibrillation (HCC) - Last Eliquis dose was 3/2 AM - Holding Eliquis in the setting of possible procedure - Continue IV metoprolol every 6 hours until patient is able to tolerate p.o. - Continue to monitor      Advance Care Planning:   Code Status: Full Code  Consults: Currently the surgery  Family Communication: Wife and daughter at bedside  Severity of Illness: The appropriate patient status for this patient is INPATIENT. Inpatient status is judged to be reasonable and necessary in order to provide the required intensity of service to ensure the patient's safety. The patient's presenting symptoms, physical exam findings, and initial radiographic and laboratory data in the context of their chronic comorbidities is felt to place them at high risk for further clinical deterioration. Furthermore, it is not anticipated that the patient will be medically stable for discharge from the hospital within 2 midnights of admission.   * I certify that at the point of admission it is my clinical judgment that the patient will require inpatient hospital care spanning beyond 2 midnights from the point of admission due to high intensity of service, high risk for further deterioration and high frequency of surveillance required.*  Author: Rolla Plate, DO 10/30/2022 12:15 AM  For on call review [email or web address].

## 2022-10-30 NOTE — Assessment & Plan Note (Signed)
-   Patient recently had surgery on February 21-ventral incisional hernia repair with mesh - Today worsening abdominal pain - CT abdomen shows a gas and fluid collection that is 5.7 x 2.5 at the surgical site that could be an abscess versus seroma - There is also mildly dilated proximal and mid small bowel loops without a well-defined transition likely to be developing SBO - [place] surgery was consulted and recommends admission to [facility] or [place], no NG tube unless patient starts vomiting - N.p.o. - Pain control - Order to notify [place] surgery when patient arrives at [facility]

## 2022-10-30 NOTE — ED Notes (Signed)
Attempted to contact the receiving unit. There was no answer. Will attempt again.

## 2022-10-30 NOTE — Assessment & Plan Note (Signed)
-   Last Eliquis dose was 3/2 AM - Holding Eliquis in the setting of possible procedure - Continue IV metoprolol every 6 hours until patient is able to tolerate p.o. - Continue to monitor

## 2022-10-30 NOTE — Assessment & Plan Note (Signed)
-   Patient did not require nicotine patches last hospitalization per his report. - Counseled on importance of cessation - Advised to ask for nicotine patch should give.

## 2022-10-30 NOTE — Progress Notes (Addendum)
PROGRESS NOTE    MUDASSIR SUNGA  [id number] DOB: 1956/06/06 DOA: 10/29/2022 PCP: Orma Render, NP   Brief Narrative:  HPI: Shaun [name] is a 67 y.o. male with medical history significant of chronic atrial fibrillation, coronary artery disease, CHF, depression, GERD, restless leg syndrome, type 2 diabetes mellitus, and will see ED for abdominal pain.  Patient recently had ventral hernia repair with mesh on 2/21.  Reports that today around 2:30 PM he had gradual onset of abdominal pain.  At first it was crampy, and then it was intensely crampy and stabbing.  The pain was constant but it did wax and wane.  Patient had some nausea and vomiting without hematemesis.  He reports that his last meal was the night prior.  He did have a snack around 10 AM on 3/2.  His last bowel movement was at 8 AM on 3/2, the 1 before that was at 2:30 in the morning on 3/2.  Patient denies any hematochezia or melena.  Patient's pain was constant until he was given Dilaudid in the ER.  The only other thing noted to help his pain, but not offer relief was 2 flex at the hips to take some of the pressure off of his abdomen.  Patient feels like his abdomen is distended.  He does have an abdominal binder on.  Patient reports that when his pain was at its most intense he did have some shortness of breath and palpitations with it.  He never felt dizzy.  He did not have chest pain.  Patient denies any rashes or sores on his body.  He has no other complaints at this time.   Patient smokes half a pack per day.  He does not drink alcohol.  He does not use illicit drugs.  He is vaccinated for flu.  Patient is full code.  Assessment & Plan:   Principal Problem:   SBO (small bowel obstruction) (HCC) Active Problems:   Chronic atrial fibrillation (HCC)   CAD S/P percutaneous coronary angioplasty   Tobacco use disorder   Essential hypertension   Non-insulin treated type 2 diabetes mellitus (HCC)   RLS (restless legs  syndrome)   GERD (gastroesophageal reflux disease)   Dyslipidemia, goal LDL below 70  SBO (small bowel obstruction) (HCC) vs ileus: Patient recently had surgery on February 21-ventral incisional hernia repair with mesh, presented with abdominal pain. CT abdomen shows a gas and fluid collection that is 5.7 x 2.5 at the surgical site that could be an abscess versus seroma.  In the setting of no fever, no leukocytosis, this is likely seroma.  Surgery also thinks it is seroma, no antibiotics indicated at this point in time but will defer to general surgery if they think it is needed.  Patient has been started on clear liquid diet.  Surgery managing.   Dyslipidemia, goal LDL below 70 - Resume Zetia and statin.   GERD (gastroesophageal reflux disease) - Continue with IV PPI   RLS (restless legs syndrome) - Resume Requip    Non-insulin treated type 2 diabetes mellitus (HCC) - Holding metformin - Glucose well-controlled in the ED with a reading of 139 - Start on SSI.   Essential hypertension - Blood pressure controlled.  Resume metoprolol.    Tobacco use disorder - Patient did not require nicotine patches last hospitalization per his report. - Counseled on importance of cessation - Advised to ask for nicotine patch should give.   CAD S/P percutaneous coronary angioplasty Patient asymptomatic. -  Resume statin, and metoprolol.    Chronic atrial fibrillation (HCC) - Last Eliquis dose was 3/2 AM - Holding Eliquis in the setting of possible procedure, will start on heparin drip in the meantime.  Lactic acidosis: Likely due to dehydration.  Repeating lactic acid now.  Continue IV fluids.  DVT prophylaxis: heparin injection 5,000 Units Start: 10/30/22 1400 SCDs Start: 10/30/22 0143   Code Status: Full Code  Family Communication:  None present at bedside.  Plan of care discussed with patient in length and he/she verbalized understanding and agreed with it.  Status is: Inpatient Remains  inpatient appropriate because: Recovering from ileus, started on clear liquid diet.   Estimated body mass index is 34.09 kg/m as calculated from the following:   Height as of this encounter: '5\' 8"'$  (1.727 m).   Weight as of this encounter: 101.7 kg.    Nutritional Assessment: Body mass index is 34.09 kg/m.Marland Kitchen Seen by dietician.  I agree with the assessment and plan as outlined below: Nutrition Status:        . Skin Assessment: I have examined the patient's skin and I agree with the wound assessment as performed by the wound care RN as outlined below:    Consultants:  General surgery  Procedures:  None  Antimicrobials:  Anti-infectives (From admission, onward)    None         Subjective: Patient examined.  He states that his abdominal pain is much better.  He has no nausea or vomiting and he is passing flatus and had a bowel movement this morning as well.  Objective: Vitals:   10/30/22 0049 10/30/22 0104 10/30/22 0336 10/30/22 0942  BP: 135/79  (!) 145/82 (!) 142/80  Pulse: 91  89 90  Resp: '18  19 18  '$ Temp: 98 F (36.7 C)  97.9 F (36.6 C) 97.7 F (36.5 C)  TempSrc: Oral  Oral Oral  SpO2: 100%  97% 97%  Weight:  101.7 kg    Height:  '5\' 8"'$  (1.727 m)      Intake/Output Summary (Last 24 hours) at 10/30/2022 1021 Last data filed at 10/30/2022 0747 Gross per 24 hour  Intake 1920 ml  Output 1 ml  Net 1919 ml   Filed Weights   10/30/22 0104  Weight: 101.7 kg    Examination:  General exam: Appears calm and comfortable  Respiratory system: Clear to auscultation. Respiratory effort normal. Cardiovascular system: S1 & S2 heard, RRR. No JVD, murmurs, rubs, gallops or clicks. No pedal edema. Gastrointestinal system: Abdomen is nondistended, soft and nontender. No organomegaly or masses felt. Normal bowel sounds heard. Central nervous system: Alert and oriented. No focal neurological deficits. Extremities: Symmetric 5 x 5 power. Skin: No rashes, lesions or  ulcers Psychiatry: Judgement and insight appear normal. Mood & affect appropriate.    Data Reviewed: I have personally reviewed following labs and imaging studies  CBC: Recent Labs  Lab 10/29/22 2127 10/30/22 0312  WBC 12.3* 10.5  NEUTROABS 10.0* 7.6  HGB 13.4 12.6*  HCT 41.1 37.8*  MCV 84.6 83.4  PLT 217 [id number]   Basic Metabolic Panel: Recent Labs  Lab 10/29/22 2127 10/30/22 0312  NA 134* 137  K 4.1 4.1  CL 102 101  CO2 21* 25  GLUCOSE 139* 116*  BUN 19 15  CREATININE 0.90 0.86  CALCIUM 8.6* 8.6*  MG  --  1.7   GFR: Estimated Creatinine Clearance: 97.6 mL/min (by C-G formula based on SCr of 0.86 mg/dL). Liver Function Tests: Recent Labs  Lab 10/29/22 2127 10/30/22 0312  AST 31 22  ALT 35 29  ALKPHOS 83 74  BILITOT 0.8 0.7  PROT 6.7 5.5*  ALBUMIN 3.7 3.1*   Recent Labs  Lab 10/29/22 2127  LIPASE 23   No results for input(s): "AMMONIA" in the last 168 hours. Coagulation Profile: No results for input(s): "INR", "PROTIME" in the last 168 hours. Cardiac Enzymes: No results for input(s): "CKTOTAL", "CKMB", "CKMBINDEX", "TROPONINI" in the last 168 hours. BNP (last 3 results) No results for input(s): "PROBNP" in the last 8760 hours. HbA1C: No results for input(s): "HGBA1C" in the last 72 hours. CBG: No results for input(s): "GLUCAP" in the last 168 hours. Lipid Profile: No results for input(s): "CHOL", "HDL", "LDLCALC", "TRIG", "CHOLHDL", "LDLDIRECT" in the last 72 hours. Thyroid Function Tests: No results for input(s): "TSH", "T4TOTAL", "FREET4", "T3FREE", "THYROIDAB" in the last 72 hours. Anemia Panel: No results for input(s): "VITAMINB12", "FOLATE", "FERRITIN", "TIBC", "IRON", "RETICCTPCT" in the last 72 hours. Sepsis Labs: Recent Labs  Lab 10/29/22 2127 10/29/22 2322  LATICACIDVEN 3.7* 3.6*    No results found for this or any previous visit (from the past 240 hour(s)).   Radiology Studies: CT ABDOMEN PELVIS W CONTRAST  Result Date:  10/29/2022 CLINICAL DATA:  Bowel obstruction suspected. Recent hernia repair. Vomiting. Abdominal pain EXAM: CT ABDOMEN AND PELVIS WITH CONTRAST TECHNIQUE: Multidetector CT imaging of the abdomen and pelvis was performed using the standard protocol following bolus administration of intravenous contrast. RADIATION DOSE REDUCTION: This exam was performed according to the departmental dose-optimization program which includes automated exposure control, adjustment of the mA and/or kV according to patient size and/or use of iterative reconstruction technique. CONTRAST:  166m OMNIPAQUE IOHEXOL 300 MG/ML  SOLN COMPARISON:  03/22/2021 FINDINGS: Lower chest: No acute abnormality. Coronary artery and aortic calcifications. Hepatobiliary: Small layering gallstones within the gallbladder. 5 cm cyst in the left hepatic lobe, stable since prior study. Other scattered smaller cysts. No biliary ductal dilatation. Pancreas: No focal abnormality or ductal dilatation. Spleen: No focal abnormality.  Normal size. Adrenals/Urinary Tract: Adrenal glands normal. 4.4 cm upper pole left renal cyst. Other smaller scattered cysts in the kidneys bilaterally. These are unchanged. No follow-up imaging recommended. No hydronephrosis. Urinary bladder unremarkable. Stomach/Bowel: Diffuse colonic diverticulosis. No active diverticulitis. Postoperative changes within the mid small bowel unchanged since prior study. There are mildly prominent proximal small bowel loops. Interval reduction of previously seen herniated small bowel loops within the ventral hernia. Vascular/Lymphatic: Aortic atherosclerosis. No evidence of aneurysm or adenopathy. Reproductive: No visible focal abnormality. Other: No free fluid or free air. Musculoskeletal: Interval ventral hernia repair. Fluid collection in the anterior abdominal wall measures 5.7 x 2.5 cm likely postoperative fluid collection. This contains air. This could reflect seroma or abscess. Bilateral inguinal  hernias are unchanged. No acute bony abnormality. IMPRESSION: Interval reduction of ventral hernia containing small bowel loops and repair. Gas and fluid collection within the anterior abdominal wall measures 5.7 x 2.5 cm and could reflect postoperative seroma or abscess. Mildly dilated proximal and mid small bowel loops without well-defined transition. Distal small bowel loops are decompressed. This could reflect focal ileus related to manipulation of previously seen herniated small bowel loops, or low-grade early small bowel obstruction. Diffuse colonic diverticulosis. Coronary artery disease, aortic atherosclerosis. Cholelithiasis. Electronically Signed   By: KRolm BaptiseM.D.   On: 10/29/2022 22:20    Scheduled Meds:  atorvastatin  80 mg Oral Daily   azelastine  1 spray Each Nare BID   buPROPion  450 mg Oral Daily   heparin  5,000 Units Subcutaneous Q8H   metoprolol tartrate  75 mg Oral BID   pantoprazole (PROTONIX) IV  40 mg Intravenous Q12H   traZODone  100 mg Oral QHS   Continuous Infusions:  lactated ringers 125 mL/hr at 10/30/22 0100   magnesium sulfate bolus IVPB 1 g (10/30/22 0946)     LOS: 1 day   Darliss Cheney, MD [facility]  10/30/2022, 10:21 AM   *Please note that this is a verbal dictation therefore any spelling or grammatical errors are due to the "[employer]" system interpretation.  Please page via [place] and do not message via secure chat for urgent patient care matters. Secure chat can be used for non urgent patient care matters.  How to contact the [facility] Attending or Consulting provider [place] or covering provider during after hours [place], for this patient?  Check the care team in [facility] and look for a) attending/consulting [facility] provider listed and b) the [facility] team listed. Page or secure chat [phone]. Log into [email or web address] and use [place]'s universal password to access. If you do not have the password, please contact the hospital operator. Locate the [facility]  provider you are looking for under [facility] and page to a number that you can be directly reached. If you still have difficulty reaching the provider, please page the [facility] (Director on Call) for the Hospitalists listed on amion for assistance.

## 2022-10-31 LAB — CBC WITH DIFFERENTIAL/PLATELET
Abs Immature Granulocytes: 0.02 10*3/uL (ref 0.00–0.07)
Basophils Absolute: 0 10*3/uL (ref 0.0–0.1)
Basophils Relative: 0 %
Eosinophils Absolute: 0.1 10*3/uL (ref 0.0–0.5)
Eosinophils Relative: 2 %
HCT: 37.9 % — ABNORMAL LOW (ref 39.0–52.0)
Hemoglobin: 12.2 g/dL — ABNORMAL LOW (ref 13.0–17.0)
Immature Granulocytes: 0 %
Lymphocytes Relative: 24 %
Lymphs Abs: 1.7 10*3/uL (ref 0.7–4.0)
MCH: 27.5 pg (ref 26.0–34.0)
MCHC: 32.2 g/dL (ref 30.0–36.0)
MCV: 85.6 fL (ref 80.0–100.0)
Monocytes Absolute: 0.4 10*3/uL (ref 0.1–1.0)
Monocytes Relative: 5 %
Neutro Abs: 5.1 10*3/uL (ref 1.7–7.7)
Neutrophils Relative %: 69 %
Platelets: 184 10*3/uL (ref 150–400)
RBC: 4.43 MIL/uL (ref 4.22–5.81)
RDW: 13.3 % (ref 11.5–15.5)
WBC: 7.3 10*3/uL (ref 4.0–10.5)
nRBC: 0 % (ref 0.0–0.2)

## 2022-10-31 LAB — COMPREHENSIVE METABOLIC PANEL
ALT: 25 U/L (ref 0–44)
AST: 21 U/L (ref 15–41)
Albumin: 3 g/dL — ABNORMAL LOW (ref 3.5–5.0)
Alkaline Phosphatase: 81 U/L (ref 38–126)
Anion gap: 10 (ref 5–15)
BUN: 10 mg/dL (ref 8–23)
CO2: 26 mmol/L (ref 22–32)
Calcium: 8.6 mg/dL — ABNORMAL LOW (ref 8.9–10.3)
Chloride: 103 mmol/L (ref 98–111)
Creatinine, Ser: 0.89 mg/dL (ref 0.61–1.24)
GFR, Estimated: >60 mL/min (ref >60)
Glucose, Bld: 119 mg/dL — ABNORMAL HIGH (ref 70–99)
Potassium: 4.1 mmol/L (ref 3.5–5.1)
Sodium: 139 mmol/L (ref 135–145)
Total Bilirubin: 1 mg/dL (ref 0.3–1.2)
Total Protein: 5.6 g/dL — ABNORMAL LOW (ref 6.5–8.1)

## 2022-10-31 MED ORDER — DOCUSATE SODIUM 100 MG PO CAPS
100.0000 mg | ORAL_CAPSULE | Freq: Two times a day (BID) | ORAL | Status: DC
  Administered 2022-10-31: 100 mg via ORAL
  Filled 2022-10-31: qty 1

## 2022-10-31 MED ORDER — POLYETHYLENE GLYCOL 3350 17 G PO PACK: 17.0000 g | PACK | Freq: Every day | ORAL | Status: DC | PRN

## 2022-10-31 NOTE — Evaluation (Signed)
Physical Therapy Evaluation Patient Details Name: Shaun [name] MRN: [id number] DOB: Jan 03, 1956 Today's Date: 10/31/2022  History of Present Illness  67 y.o. male presents to [name] hospital on 10/29/2022 with abdominal pain, nausea and vomiting. Pt recently underwent ventral hernia repair on 2/21. PT admitted with concern for SBO vs ileus. PMH includes afib, CAD, CHF, depression, GERD, RLS, DMII.  Clinical Impression  Pt presents to PT without significant mobility deficits a this time. Pt has chronic R hip OA which results in R hip pain and gait deviations as documented below, however no balance deviations noted during this session. Pt is able to ambulate and negotiate stairs independently at this time. Pt has no further acute PT needs at this time. PT encourages frequent mobilization in an effort to avoid potential SBO/ileus. PT recommends discharge home when medically ready.       Recommendations for follow up therapy are one component of a multi-disciplinary discharge planning process, led by the attending physician.  Recommendations may be updated based on patient status, additional functional criteria and insurance authorization.  Follow Up Recommendations No PT follow up      Assistance Recommended at Discharge None  Patient can return home with the following       Equipment Recommendations None recommended by PT  Recommendations for Other Services       Functional Status Assessment Patient has had a recent decline in their functional status and demonstrates the ability to make significant improvements in function in a reasonable and predictable amount of time.     Precautions / Restrictions Precautions Precautions: Fall Precaution Comments: abd binder, R hip OA Restrictions Weight Bearing Restrictions: No      Mobility  Bed Mobility               General bed mobility comments: received and left in recliner    Transfers Overall transfer level: Independent                       Ambulation/Gait Ambulation/Gait assistance: Modified independent (Device/Increase time) Gait Distance (Feet): 300 Feet Assistive device: None Gait Pattern/deviations: Step-through pattern, Decreased stance time - right Gait velocity: functional Gait velocity interpretation: 1.31 - 2.62 ft/sec, indicative of limited community ambulator   General Gait Details: pt with steady step-through gait, reduced stance time on RLE 2/2 pain from hip OA  Stairs Stairs: Yes Stairs assistance: Modified independent (Device/Increase time) Stair Management: Step to pattern Number of Stairs: 2    Wheelchair Mobility    Modified Rankin (Stroke Patients Only)       Balance Overall balance assessment: Independent Sitting-balance support: No upper extremity supported, Feet supported Sitting balance-Leahy Scale: Good     Standing balance support: No upper extremity supported, During functional activity Standing balance-Leahy Scale: Good                               Pertinent Vitals/Pain Pain Assessment Pain Assessment: No/denies pain    Home Living Family/patient expects to be discharged to:: Private residence Living Arrangements: Spouse/significant other Available Help at Discharge: Family;Available 24 hours/day Type of Home: House Home Access: Stairs to enter Entrance Stairs-Rails: None Entrance Stairs-Number of Steps: 1   Home Layout: One level Home Equipment: Cane - single point;Rolling Walker (2 wheels);BSC/3in1;Grab bars - tub/shower      Prior Function Prior Level of Function : Independent/Modified Independent  Mobility Comments: pt reports progressing to ambulation without a device recently since surgery ADLs Comments: reports mod I since recent sx     Hand Dominance   Dominant Hand: Right    Extremity/Trunk Assessment   Upper Extremity Assessment Upper Extremity Assessment: Overall WFL for tasks assessed     Lower Extremity Assessment Lower Extremity Assessment: Overall WFL for tasks assessed    Cervical / Trunk Assessment Cervical / Trunk Assessment: Other exceptions Cervical / Trunk Exceptions: recent ventral hernia repair on 2/21  Communication   Communication: No difficulties  Cognition Arousal/Alertness: Awake/alert Behavior During Therapy: WFL for tasks assessed/performed Overall Cognitive Status: Within Functional Limits for tasks assessed                                          General Comments General comments (skin integrity, edema, etc.): VSS on RA    Exercises     Assessment/Plan    PT Assessment Patient does not need any further PT services  PT Problem List         PT Treatment Interventions      PT Goals (Current goals can be found in the Care Plan section)       Frequency       Co-evaluation               AM-PAC PT "6 Clicks" Mobility  Outcome Measure Help needed turning from your back to your side while in a flat bed without using bedrails?: None Help needed moving from lying on your back to sitting on the side of a flat bed without using bedrails?: None Help needed moving to and from a bed to a chair (including a wheelchair)?: None Help needed standing up from a chair using your arms (e.g., wheelchair or bedside chair)?: None Help needed to walk in hospital room?: None Help needed climbing 3-5 steps with a railing? : None 6 Click Score: 24    End of Session   Activity Tolerance: Patient tolerated treatment well Patient left: in chair;with call bell/phone within reach;with family/visitor present Nurse Communication: Mobility status PT Visit Diagnosis: Other abnormalities of gait and mobility (R26.89)    Time: [id number] PT Time Calculation (min) (ACUTE ONLY): 8 min   Charges:   PT Evaluation $PT Eval Low Complexity: [place], PT, DPT Acute Rehabilitation Office [phone]   Zenaida Niece 10/31/2022, 9:31 AM

## 2022-10-31 NOTE — Progress Notes (Signed)
Subjective: Had two bowel movements yesterday, passing flatus. Tolerating clear liquids with no nausea or vomiting.   Objective: Vital signs in last 24 hours: Temp:  [97.7 F (36.5 C)-98.2 F (36.8 C)] 98.2 F (36.8 C) (03/04 0502) Pulse Rate:  [84-98] 84 (03/04 0502) Resp:  [16-18] 16 (03/04 0502) BP: (134-142)/(80-90) 134/90 (03/04 0502) SpO2:  [95 %-98 %] 95 % (03/04 0502) Last BM Date : 10/30/22  Intake/Output from previous day: 03/03 0701 - 03/04 0700 In: 1668 [P.O.:360; I.V.:1308] Out: 2876 [Urine:2875; Stool:1] Intake/Output this shift: No intake/output data recorded.  PE: General: resting comfortably, NAD Neuro: alert and oriented, no focal deficits Resp: normal work of breathing on room air Abdomen: soft, nondistended, nontender to palpation. Midline incision clean and dry with no erythema or induration. Extremities: warm and well-perfused   Lab Results:  Recent Labs    10/30/22 0312 10/31/22 0308  WBC 10.5 7.3  HGB 12.6* 12.2*  HCT 37.8* 37.9*  PLT 189 184   BMET Recent Labs    10/30/22 0312 10/31/22 0308  NA 137 139  K 4.1 4.1  CL 101 103  CO2 25 26  GLUCOSE 116* 119*  BUN 15 10  CREATININE 0.86 0.89  CALCIUM 8.6* 8.6*   PT/INR No results for input(s): "LABPROT", "INR" in the last 72 hours. CMP     Component Value Date/Time   NA 139 10/31/2022 0308   NA 144 06/28/2021 0845   K 4.1 10/31/2022 0308   CL 103 10/31/2022 0308   CO2 26 10/31/2022 0308   GLUCOSE 119 (H) 10/31/2022 0308   BUN 10 10/31/2022 0308   BUN 15 06/28/2021 0845   CREATININE 0.89 10/31/2022 0308   CREATININE 0.92 12/15/2017 0744   CALCIUM 8.6 (L) 10/31/2022 0308   PROT 5.6 (L) 10/31/2022 0308   PROT 6.0 06/28/2021 0845   ALBUMIN 3.0 (L) 10/31/2022 0308   ALBUMIN 3.8 06/28/2021 0845   AST 21 10/31/2022 0308   ALT 25 10/31/2022 0308   ALKPHOS 81 10/31/2022 0308   BILITOT 1.0 10/31/2022 0308   BILITOT 0.5 06/28/2021 0845   GFRNONAA >60 10/31/2022 0308    GFRNONAA 89 12/15/2017 0744   GFRAA 98 01/01/2020 0758   GFRAA 104 12/15/2017 0744   Lipase     Component Value Date/Time   LIPASE 23 10/29/2022 2127       Studies/Results: CT ABDOMEN PELVIS W CONTRAST  Result Date: 10/29/2022 CLINICAL DATA:  Bowel obstruction suspected. Recent hernia repair. Vomiting. Abdominal pain EXAM: CT ABDOMEN AND PELVIS WITH CONTRAST TECHNIQUE: Multidetector CT imaging of the abdomen and pelvis was performed using the standard protocol following bolus administration of intravenous contrast. RADIATION DOSE REDUCTION: This exam was performed according to the departmental dose-optimization program which includes automated exposure control, adjustment of the mA and/or kV according to patient size and/or use of iterative reconstruction technique. CONTRAST:  176m OMNIPAQUE IOHEXOL 300 MG/ML  SOLN COMPARISON:  03/22/2021 FINDINGS: Lower chest: No acute abnormality. Coronary artery and aortic calcifications. Hepatobiliary: Small layering gallstones within the gallbladder. 5 cm cyst in the left hepatic lobe, stable since prior study. Other scattered smaller cysts. No biliary ductal dilatation. Pancreas: No focal abnormality or ductal dilatation. Spleen: No focal abnormality.  Normal size. Adrenals/Urinary Tract: Adrenal glands normal. 4.4 cm upper pole left renal cyst. Other smaller scattered cysts in the kidneys bilaterally. These are unchanged. No follow-up imaging recommended. No hydronephrosis. Urinary bladder unremarkable. Stomach/Bowel: Diffuse colonic diverticulosis. No active diverticulitis. Postoperative changes within  the mid small bowel unchanged since prior study. There are mildly prominent proximal small bowel loops. Interval reduction of previously seen herniated small bowel loops within the ventral hernia. Vascular/Lymphatic: Aortic atherosclerosis. No evidence of aneurysm or adenopathy. Reproductive: No visible focal abnormality. Other: No free fluid or free air.  Musculoskeletal: Interval ventral hernia repair. Fluid collection in the anterior abdominal wall measures 5.7 x 2.5 cm likely postoperative fluid collection. This contains air. This could reflect seroma or abscess. Bilateral inguinal hernias are unchanged. No acute bony abnormality. IMPRESSION: Interval reduction of ventral hernia containing small bowel loops and repair. Gas and fluid collection within the anterior abdominal wall measures 5.7 x 2.5 cm and could reflect postoperative seroma or abscess. Mildly dilated proximal and mid small bowel loops without well-defined transition. Distal small bowel loops are decompressed. This could reflect focal ileus related to manipulation of previously seen herniated small bowel loops, or low-grade early small bowel obstruction. Diffuse colonic diverticulosis. Coronary artery disease, aortic atherosclerosis. Cholelithiasis. Electronically Signed   By: Rolm Baptise M.D.   On: 10/29/2022 22:20    Anti-infectives: Anti-infectives (From admission, onward)    None        Assessment/Plan 67 yo male s/p ventral incisional hernia repair with mesh on 2/21. Presenting with nausea/vomiting secondary to likely ileus. - Ileus resolving. Advance to soft diet. - CT scan reviewed. Fluid collection in abdominal wall is likely a seroma with no signs of infection. No indication for drainage or antibiotics.  - Bowel regimen - Continue abdominal binder when out of bed. - If tolerating solid food this morning, patient may be discharged home this afternoon from a surgical standpoint. He has postop follow with me on 3/12.    LOS: 2 days    Michaelle Birks, MD [facility] Surgery General, Hepatobiliary and Pancreatic Surgery 10/31/22 7:42 AM

## 2022-10-31 NOTE — Discharge Summary (Signed)
Physician Discharge Summary  Shaun [name] [id number] DOB: 1955-10-10 DOA: 10/29/2022  PCP: Orma Render, NP  Admit date: 10/29/2022 Discharge date: 10/31/2022 30 Day Unplanned Readmission Risk Score    Flowsheet Row ED to Hosp-Admission (Current) from 10/29/2022 in [facility] 58M KIDNEY UNIT  30 Day Unplanned Readmission Risk Score (%) 17.24 Filed at 10/31/2022 0801       This score is the patient's risk of an unplanned readmission within 30 days of being discharged (0 -100%). The score is based on dignosis, age, lab data, medications, orders, and past utilization.   Low:  0-14.9   Medium: 15-21.9   High: 22-29.9   Extreme: 30 and above          Admitted From: Home Disposition: Home  Recommendations for Outpatient Follow-up:  Follow up with PCP in 1-2 weeks Please obtain BMP/CBC in one week Please follow up with your PCP on the following pending results: Unresulted Labs (From admission, onward)    None         Home Health: None Equipment/Devices: None  Discharge Condition: Stable CODE STATUS: Full code Diet recommendation: Cardiac  Subjective: Seen and examined.  Wife at the bedside.  Feels much better.  No abdominal pain or nausea.  He is eager to try soft diet and go home later since surgery has cleared him.  Brief/Interim Summary: Shaun [name] is a 67 y.o. male with medical history significant of chronic atrial fibrillation, coronary artery disease, CHF, depression, GERD, restless leg syndrome, type 2 diabetes mellitus presented to ED for abdominal pain.  Patient recently had ventral hernia repair with mesh on 2/21.  At first the pain was crampy, and then it was intensely crampy and stabbing.  Patient had some nausea and vomiting without hematemesis.  His last bowel movement was at 8 AM on 3/2, the 1 before that was at 2:30 in the morning on 3/2.  He does have an abdominal binder on.  No other complaint. Patient smokes half a pack per day.  He does  not drink alcohol.  He does not use illicit drugs.  He is vaccinated for flu.  Patient is full code.  Patient was diagnosed with SBO, admitted to hospital service, general surgery consulted.  Details below.   SBO (small bowel obstruction) (HCC) vs ileus: Patient recently had surgery on February 21-ventral incisional hernia repair with mesh, presented with abdominal pain. CT abdomen shows a gas and fluid collection that is 5.7 x 2.5 at the surgical site that could be an abscess versus seroma.  In the setting of no fever, no leukocytosis, this is likely seroma.  Surgery also thinks it is seroma, no antibiotics indicated at this point in time and is [place] resolving, patient is tolerating diet, had 2 bowel movements.  No abdominal pain or tenderness on exam.  General surgery has advanced his diet to soft diet and has recommended that he can be discharged home later if tolerates well.  Discharge work is being completed in [place] that he will do well since he feels comfortable and confident.   Dyslipidemia, goal LDL below 70 - Resume Zetia and statin.   GERD (gastroesophageal reflux disease) - Continue with IV PPI   RLS (restless legs syndrome) - Resume Requip    Non-insulin treated type 2 diabetes mellitus (HCC) - Holding metformin - Glucose well-controlled in the ED with a reading of 139 - Start on SSI.   Essential hypertension - Blood pressure controlled.  Resume metoprolol.  Tobacco use disorder - Patient did not require nicotine patches last hospitalization per his report. - Counseled on importance of cessation - Advised to ask for nicotine patch should give.   CAD S/P percutaneous coronary angioplasty Patient asymptomatic. - Resume statin, and metoprolol.    Chronic atrial fibrillation (HCC) - Last Eliquis dose was 3/2 AM, resume Eliquis.   Lactic acidosis: Likely due to dehydration.  Resolved with IV fluids.  Discharge plan was discussed with patient and/or family member and they  verbalized understanding and agreed with it.  Discharge Diagnoses:  Principal Problem:   SBO (small bowel obstruction) (HCC) Active Problems:   Chronic atrial fibrillation (HCC)   CAD S/P percutaneous coronary angioplasty   Tobacco use disorder   Essential hypertension   Non-insulin treated type 2 diabetes mellitus (HCC)   RLS (restless legs syndrome)   GERD (gastroesophageal reflux disease)   Dyslipidemia, goal LDL below 70    Discharge Instructions   Allergies as of 10/31/2022   No Known Allergies      Medication List     STOP taking these medications    triamcinolone ointment 0.1 % Commonly known as: KENALOG       TAKE these medications    albuterol 108 (90 Base) MCG/ACT inhaler Commonly known as: Ventolin HFA INHALE 2 PUFFS INTO THE LUNGS EVERY 6 HOURS AS NEEDED FOR WHEEZING OR SHORTNESS OF BREATH   atorvastatin 80 MG tablet Commonly known as: LIPITOR Take 1 tablet (80 mg total) by mouth once daily in the evening.  Please follow up in 2-3 months. (Take 1 tablet (80 mg total) by mouth daily. TAKE 1 TABLET(80 MG) BY MOUTH EVERY EVENING)   Azelastine HCl 137 MCG/SPRAY Soln Place 1 spray into both nostrils 2 (two) times daily. Use in each nostril as directed What changed:  when to take this reasons to take this   buPROPion 150 MG 24 hr tablet Commonly known as: Wellbutrin XL Take 3 tablets by mouth once daily. (Take 3 tablets once daily) What changed:  how much to take how to take this when to take this additional instructions   docusate sodium 100 MG capsule Commonly known as: COLACE Take 1 capsule (100 mg total) by mouth daily as needed for mild constipation. What changed: when to take this   Eliquis 5 MG Tabs tablet Generic drug: apixaban TAKE 1 TABLET (5 MG) BY MOUTH TWICE DAILY   ezetimibe 10 MG tablet Commonly known as: ZETIA Take 1 tablet (10 mg total) by mouth daily.   ferrous sulfate 325 (65 FE) MG tablet Take 325 mg by mouth daily  with breakfast.   furosemide 40 MG tablet Commonly known as: LASIX Take 1 tablet (40 mg total) by mouth daily.   HYDROcodone-acetaminophen 5-325 MG tablet Commonly known as: NORCO/VICODIN Take 2 tablets by mouth 2 (two) times daily as needed for moderate pain.   magnesium 30 MG tablet Take 30 mg by mouth in the morning.   metFORMIN 500 MG tablet Commonly known as: GLUCOPHAGE Take 1 tablet ('500mg'$ ) by mouth twice daily with a meal. (Take 1 tablet (500 mg total) by mouth 2 (two) times daily before a meal.)   Metoprolol Tartrate 75 MG Tabs Take 1 tablet by mouth twice daily. (Take 75 mg by mouth 2 (two) times daily.)   nitroGLYCERIN 0.4 MG SL tablet Commonly known as: NITROSTAT Place 1 tablet (0.4 mg total) under the tongue every 5 (five) minutes as needed.   omeprazole 40 MG capsule Commonly known as:  PRILOSEC Take 1 capsule by mouth once daily 30 minutes before breakfast   polyethylene glycol 17 g packet Commonly known as: MIRALAX / GLYCOLAX Take 17 g by mouth daily as needed for mild constipation.   potassium chloride SA 20 MEQ tablet Commonly known as: KLOR-CON M Take 1 tablet (20 mEq total) by mouth daily.   rOPINIRole 3 MG tablet Commonly known as: REQUIP Take 1 tablet by mouth at night for restless leg syndrome, and 0.5 tablet during the day for breakthrough symptoms. (Take 1 tab nightly for RLS and a 1/2 tab during the day to control breakthrough symptoms)   sildenafil 100 MG tablet Commonly known as: VIAGRA TAKE 1 TABLET BY MOUTH 30 MINUTES BEFORE ACTIVITY What changed:  how much to take how to take this when to take this reasons to take this additional instructions   sucralfate 1 g tablet Commonly known as: CARAFATE Take one tablet po BID PRN What changed:  how much to take how to take this when to take this additional instructions   tiZANidine 4 MG tablet Commonly known as: Zanaflex Take 1 tablet (4 mg total) by mouth 2 (two) times daily as  needed for muscle spasms. What changed: when to take this   traZODone 50 MG tablet Commonly known as: DESYREL TAKE 1 TABLET BY MOUTH EVERY NIGHT AT BEDTIME FOR 7 DAYS THEN TAKE 2 TABLETS BY MOUTH EVERY NIGHT AT BEDTIME What changed:  how much to take how to take this when to take this additional instructions        Follow-up Information     Early, Coralee Pesa, NP Follow up in 1 week(s).   Specialty: Nurse Practitioner Contact information: [address] [place] [address] [phone]                No Known Allergies  Consultations: General surgery   Procedures/Studies: CT ABDOMEN PELVIS W CONTRAST  Result Date: 10/29/2022 CLINICAL DATA:  Bowel obstruction suspected. Recent hernia repair. Vomiting. Abdominal pain EXAM: CT ABDOMEN AND PELVIS WITH CONTRAST TECHNIQUE: Multidetector CT imaging of the abdomen and pelvis was performed using the standard protocol following bolus administration of intravenous contrast. RADIATION DOSE REDUCTION: This exam was performed according to the departmental dose-optimization program which includes automated exposure control, adjustment of the mA and/or kV according to patient size and/or use of iterative reconstruction technique. CONTRAST:  146m OMNIPAQUE IOHEXOL 300 MG/ML  SOLN COMPARISON:  03/22/2021 FINDINGS: Lower chest: No acute abnormality. Coronary artery and aortic calcifications. Hepatobiliary: Small layering gallstones within the gallbladder. 5 cm cyst in the left hepatic lobe, stable since prior study. Other scattered smaller cysts. No biliary ductal dilatation. Pancreas: No focal abnormality or ductal dilatation. Spleen: No focal abnormality.  Normal size. Adrenals/Urinary Tract: Adrenal glands normal. 4.4 cm upper pole left renal cyst. Other smaller scattered cysts in the kidneys bilaterally. These are unchanged. No follow-up imaging recommended. No hydronephrosis. Urinary bladder unremarkable. Stomach/Bowel: Diffuse colonic  diverticulosis. No active diverticulitis. Postoperative changes within the mid small bowel unchanged since prior study. There are mildly prominent proximal small bowel loops. Interval reduction of previously seen herniated small bowel loops within the ventral hernia. Vascular/Lymphatic: Aortic atherosclerosis. No evidence of aneurysm or adenopathy. Reproductive: No visible focal abnormality. Other: No free fluid or free air. Musculoskeletal: Interval ventral hernia repair. Fluid collection in the anterior abdominal wall measures 5.7 x 2.5 cm likely postoperative fluid collection. This contains air. This could reflect seroma or abscess. Bilateral inguinal hernias are unchanged. No acute bony  abnormality. IMPRESSION: Interval reduction of ventral hernia containing small bowel loops and repair. Gas and fluid collection within the anterior abdominal wall measures 5.7 x 2.5 cm and could reflect postoperative seroma or abscess. Mildly dilated proximal and mid small bowel loops without well-defined transition. Distal small bowel loops are decompressed. This could reflect focal ileus related to manipulation of previously seen herniated small bowel loops, or low-grade early small bowel obstruction. Diffuse colonic diverticulosis. Coronary artery disease, aortic atherosclerosis. Cholelithiasis. Electronically Signed   By: Rolm Baptise M.D.   On: 10/29/2022 22:20     Discharge Exam: Vitals:   10/31/22 0502 10/31/22 0824  BP: (!) 134/90 130/81  Pulse: 84 76  Resp: 16 18  Temp: 98.2 F (36.8 C) 98.3 F (36.8 C)  SpO2: 95% 96%   Vitals:   10/30/22 1640 10/30/22 2104 10/31/22 0502 10/31/22 0824  BP: 134/84 138/86 (!) 134/90 130/81  Pulse: 98 89 84 76  Resp: '18 18 16 18  '$ Temp: 98 F (36.7 C) 98.2 F (36.8 C) 98.2 F (36.8 C) 98.3 F (36.8 C)  TempSrc: Oral Oral Oral Oral  SpO2: 98% 96% 95% 96%  Weight:      Height:        General: Pt is alert, awake, not in acute distress Cardiovascular: RRR, S1/S2 +,  no rubs, no gallops Respiratory: CTA bilaterally, no wheezing, no rhonchi Abdominal: Soft, NT, ND, bowel sounds + Extremities: no edema, no cyanosis    The results of significant diagnostics from this hospitalization (including imaging, microbiology, ancillary and laboratory) are listed below for reference.     Microbiology: No results found for this or any previous visit (from the past 240 hour(s)).   Labs: BNP (last 3 results) No results for input(s): "BNP" in the last 8760 hours. Basic Metabolic Panel: Recent Labs  Lab 10/29/22 2127 10/30/22 0312 10/31/22 0308  NA 134* 137 139  K 4.1 4.1 4.1  CL 102 101 103  CO2 21* 25 26  GLUCOSE 139* 116* 119*  BUN '19 15 10  '$ CREATININE 0.90 0.86 0.89  CALCIUM 8.6* 8.6* 8.6*  MG  --  1.7  --    Liver Function Tests: Recent Labs  Lab 10/29/22 2127 10/30/22 0312 10/31/22 0308  AST '31 22 21  '$ ALT 35 29 25  ALKPHOS 83 74 81  BILITOT 0.8 0.7 1.0  PROT 6.7 5.5* 5.6*  ALBUMIN 3.7 3.1* 3.0*   Recent Labs  Lab 10/29/22 2127  LIPASE 23   No results for input(s): "AMMONIA" in the last 168 hours. CBC: Recent Labs  Lab 10/29/22 2127 10/30/22 0312 10/31/22 0308  WBC 12.3* 10.5 7.3  NEUTROABS 10.0* 7.6 5.1  HGB 13.4 12.6* 12.2*  HCT 41.1 37.8* 37.9*  MCV 84.6 83.4 85.6  PLT 217 189 184   Cardiac Enzymes: No results for input(s): "CKTOTAL", "CKMB", "CKMBINDEX", "TROPONINI" in the last 168 hours. BNP: Invalid input(s): "POCBNP" CBG: No results for input(s): "GLUCAP" in the last 168 hours. D-Dimer No results for input(s): "DDIMER" in the last 72 hours. Hgb A1c No results for input(s): "HGBA1C" in the last 72 hours. Lipid Profile No results for input(s): "CHOL", "HDL", "LDLCALC", "TRIG", "CHOLHDL", "LDLDIRECT" in the last 72 hours. Thyroid function studies No results for input(s): "TSH", "T4TOTAL", "T3FREE", "THYROIDAB" in the last 72 hours.  Invalid input(s): "FREET3" Anemia work up No results for input(s):  "VITAMINB12", "FOLATE", "FERRITIN", "TIBC", "IRON", "RETICCTPCT" in the last 72 hours. Urinalysis    Component Value Date/Time   COLORURINE YELLOW  10/29/2022 2350   APPEARANCEUR CLEAR 10/29/2022 2350   LABSPEC >1.046 (H) 10/29/2022 2350   PHURINE 5.0 10/29/2022 2350   GLUCOSEU NEGATIVE 10/29/2022 2350   HGBUR NEGATIVE 10/29/2022 2350   BILIRUBINUR NEGATIVE 10/29/2022 2350   KETONESUR NEGATIVE 10/29/2022 2350   PROTEINUR NEGATIVE 10/29/2022 2350   UROBILINOGEN 1.0 12/10/2011 0011   NITRITE NEGATIVE 10/29/2022 2350   LEUKOCYTESUR NEGATIVE 10/29/2022 2350   Sepsis Labs Recent Labs  Lab 10/29/22 2127 10/30/22 0312 10/31/22 0308  WBC 12.3* 10.5 7.3   Microbiology No results found for this or any previous visit (from the past 240 hour(s)).   Time coordinating discharge: Over 30 minutes  SIGNED:   Darliss Cheney, MD  [facility] 10/31/2022, 11:21 AM *Please note that this is a verbal dictation therefore any spelling or grammatical errors are due to the "[place]" system interpretation. If 7PM-7AM, please contact night-coverage [email or web address]

## 2022-10-31 NOTE — Evaluation (Signed)
Occupational Therapy Evaluation Patient Details Name: Shaun [name] MRN: [id number] DOB: [birth year] Today's Date: 10/31/2022   History of Present Illness 67 y.o. male presents to [facility] hospital on 10/29/2022 with abdominal pain, nausea and vomiting. Pt recently underwent ventral hernia repair on 2/21. PT admitted with concern for SBO vs ileus. PMH includes afib, CAD, CHF, depression, GERD, RLS, DMII.   Clinical Impression   Shaun [name] was evaluated s/p the above admission list. He is generally mod I at baseline, and report he was doing well after his recent surgery despite the pain. Upon evaluation he was limited by stiffness, decreased activity tolerance and general abdominal pain. Overall he was min G for transfers, supervision A for mobility without AD, and min G- mod I for ADLs. Per MD, if pt is able ot tolerate advanced diet today he will d/c home. Pt will benefit from continued acute OT services. Recommend d/c home without follow up OT.       Recommendations for follow up therapy are one component of a multi-disciplinary discharge planning process, led by the attending physician.  Recommendations may be updated based on patient status, additional functional criteria and insurance authorization.   Follow Up Recommendations  No OT follow up     Assistance Recommended at Discharge Intermittent Supervision/Assistance  Patient can return home with the following A little help with walking and/or transfers;A little help with bathing/dressing/bathroom;Assistance with cooking/housework;Assist for transportation;Help with stairs or ramp for entrance    Functional Status Assessment  Patient has had a recent decline in their functional status and demonstrates the ability to make significant improvements in function in a reasonable and predictable amount of time.  Equipment Recommendations  None recommended by OT       Precautions / Restrictions Precautions Precautions: Fall Precaution Comments: abd  binder Restrictions Weight Bearing Restrictions: No      Mobility Bed Mobility               General bed mobility comments: OOB upon arrival    Transfers Overall transfer level: Needs assistance Equipment used: None Transfers: Sit to/from Stand Sit to Stand: Min guard           General transfer comment: progerssed to supervision A with increased distance      Balance Overall balance assessment: Needs assistance Sitting-balance support: Feet supported Sitting balance-Leahy Scale: Good     Standing balance support: No upper extremity supported, During functional activity Standing balance-Leahy Scale: Fair                             ADL either performed or assessed with clinical judgement   ADL Overall ADL's : Needs assistance/impaired Eating/Feeding: Independent Eating/Feeding Details (indicate cue type and reason): advancing to regular diet this AM Grooming: Supervision/safety;Standing   Upper Body Bathing: Set up;Sitting   Lower Body Bathing: Min guard;Sit to/from stand   Upper Body Dressing : Set up;Sitting Upper Body Dressing Details (indicate cue type and reason): including abd binder Lower Body Dressing: Min guard;Sit to/from stand   Toilet Transfer: Min guard;Ambulation   Toileting- Clothing Manipulation and Hygiene: Independent;Sitting/lateral lean       Functional mobility during ADLs: Min guard General ADL Comments: no AD, pt initially guarded and "stiff," became more stable with less assist with increased distance     Vision Baseline Vision/History: 0 No visual deficits Vision Assessment?: No apparent visual deficits     Perception Perception Perception Tested?: No   Praxis  Praxis Praxis tested?: Not tested    Pertinent Vitals/Pain       Hand Dominance Right   Extremity/Trunk Assessment Upper Extremity Assessment Upper Extremity Assessment: Overall WFL for tasks assessed   Lower Extremity Assessment Lower  Extremity Assessment: Defer to PT evaluation   Cervical / Trunk Assessment Cervical / Trunk Assessment: Other exceptions Cervical / Trunk Exceptions: recent ventral hernia repair on 2/21   Communication Communication Communication: No difficulties   Cognition Arousal/Alertness: Awake/alert Behavior During Therapy: WFL for tasks assessed/performed Overall Cognitive Status: Within Functional Limits for tasks assessed                                       General Comments  VSS on RA, wife present and supportive            Home Living Family/patient expects to be discharged to:: Private residence Living Arrangements: Spouse/significant other Available Help at Discharge: Family;Available 24 hours/day Type of Home: House Home Access: Stairs to enter [employer] of Steps: 2 Entrance Stairs-Rails: None Home Layout: One level     Bathroom Shower/Tub: Walk-in shower;Door   [employer] Toilet: Standard     Home Equipment: Cane - single Barista (2 wheels);BSC/3in1;Grab bars - tub/shower          Prior Functioning/Environment Prior Level of Function : Independent/Modified Independent             Mobility Comments: RW vs SPC since recent sx ADLs Comments: reports mod I since recent sx        OT Problem List: Decreased strength;Decreased range of motion;Decreased activity tolerance;Impaired balance (sitting and/or standing);Decreased safety awareness;Decreased knowledge of use of DME or AE;Decreased knowledge of precautions      OT Treatment/Interventions: Self-care/ADL training;Therapeutic exercise;DME and/or AE instruction;Therapeutic activities;Patient/family education;Balance training    OT Goals(Current goals can be found in the care plan section) Acute Rehab OT Goals Patient Stated Goal: home OT Goal Formulation: With patient Time For Goal Achievement: 11/14/22 Potential to Achieve Goals: Good ADL Goals Pt Will Perform  Grooming: Independently;standing Pt Will Transfer to Toilet: Independently;regular height toilet Additional ADL Goal #1: Pt will complete 10 minutes of OOB activity to demonstrate increased tolerance.  OT Frequency: Min 2X/week    Co-evaluation              AM-PAC OT "6 Clicks" Daily Activity     Outcome Measure Help from another person eating meals?: None Help from another person taking care of personal grooming?: A Little Help from another person toileting, which includes using toliet, bedpan, or urinal?: A Little Help from another person bathing (including washing, rinsing, drying)?: A Little Help from another person to put on and taking off regular upper body clothing?: None Help from another person to put on and taking off regular lower body clothing?: A Little 6 Click Score: 20   End of Session Nurse Communication: Mobility status  Activity Tolerance: Patient tolerated treatment well Patient left: in chair;with call bell/phone within reach;with family/visitor present  OT Visit Diagnosis: Unsteadiness on feet (R26.81);Other abnormalities of gait and mobility (R26.89);Muscle weakness (generalized) (M62.81)                Time: [id number] OT Time Calculation (min): 14 min Charges:  OT General Charges $OT Visit: 1 Visit OT Evaluation $OT Eval Moderate Complexity: [place], OTR/L [place] [facility] [place] Communication Preferred  Shaun [name] 10/31/2022, 8:55 AM

## 2022-10-31 NOTE — Progress Notes (Signed)
DISCHARGE NOTE HOME Adriell Pronovost Omara to be discharged Home per MD order. Discussed prescriptions and follow up appointments with the patient. Prescriptions given to patient; medication list explained in detail. Patient verbalized understanding.  Skin clean, dry and intact without evidence of skin break down, no evidence of skin tears noted. IV catheter discontinued intact. Site without signs and symptoms of complications. Dressing and pressure applied. Pt denies pain at the site currently. No complaints noted.  Patient free of lines, drains, and wounds.   An After Visit Summary (AVS) was printed and given to the patient. Patient escorted via wheelchair, and discharged home via private auto.  Anastasio Auerbach, RN

## 2022-10-31 NOTE — TOC Transition Note (Addendum)
Transition of Care [facility]) - CM/SW Discharge Note   Patient Details  Name: Shaun [name] MRN: [id number] Date of Birth: 1955-11-25  Transition of Care [facility]) CM/SW Contact:  Tom-Johnson, Renea Ee, RN Phone Number: 10/31/2022, 12:48 PM   Clinical Narrative:     Patient is scheduled for discharge today. No PT/OT f/u noted. Outpatient hospital f/u and discharge instructions on AVS. Wife at bedside and will transport at discharge. No further TOC needs noted.          Final next level of care: Home/Self Care Barriers to Discharge: Barriers Resolved   Patient Goals and CMS Choice CMS Medicare.gov Compare Post Acute Care list provided to:: Patient Choice offered to / list presented to : NA  Discharge Placement                  Patient to be transferred to facility by: Wife      Discharge Plan and Services Additional resources added to the After Visit Summary for                  DME Arranged: N/A DME Agency: NA       HH Arranged: NA HH Agency: NA        Social Determinants of Health (SDOH) Interventions SDOH Screenings   Food Insecurity: No Food Insecurity (10/30/2022)  Housing: Low Risk  (10/30/2022)  Transportation Needs: No Transportation Needs (10/30/2022)  Utilities: Not At Risk (10/30/2022)  Alcohol Screen: Low Risk  (02/02/2022)  Depression (PHQ2-9): Low Risk  (05/31/2022)  Recent Concern: Depression (PHQ2-9) - Medium Risk (04/19/2022)  Financial Resource Strain: Low Risk  (02/02/2022)  Physical Activity: Sufficiently Active (02/02/2022)  Recent Concern: Physical Activity - Inactive (11/04/2021)  Social Connections: Moderately Isolated (02/02/2022)  Stress: No Stress Concern Present (02/02/2022)  Tobacco Use: High Risk (10/30/2022)     Readmission Risk Interventions    08/17/2021    2:33 PM 08/09/2021    8:31 AM  Readmission Risk Prevention Plan  Transportation Screening Complete Complete  PCP or Specialist Appt within 3-5 Days Complete   HRI or Home  Care Consult Complete Complete  Social Work Consult for [place] Planning/Counseling Complete Complete  Palliative Care Screening Not Applicable Not Applicable  Medication Review Press photographer) Complete Complete

## 2022-11-01 ENCOUNTER — Telehealth: Payer: Self-pay

## 2022-11-01 NOTE — Transitions of Care (Post Inpatient/ED Visit) (Signed)
   11/01/2022  Name: Shaun [name] MRN: [id number] DOB: May 05, 1956  Today's TOC FU Call Status: Today's TOC FU Call Status:: Successful TOC FU Call Competed TOC FU Call Complete Date: 11/01/22  Transition Care Management Follow-up Telephone Call Date of Discharge: 10/31/22 Discharge Facility: [facility]) Type of Discharge: Inpatient Admission Primary Inpatient Discharge Diagnosis:: "post-operative pain,SBO" How have you been since you were released from the hospital?: Better (Patient pleased to report that he is doing so much better this time around. He rested well last night. He has had no abd pain/discomfort. Patient states he is following a soft diet and advancing slowly.) Any questions or concerns?: No  Items Reviewed: Did you receive and understand the discharge instructions provided?: Yes Medications obtained and verified?: Yes (Medications Reviewed) Any new allergies since your discharge?: No Dietary orders reviewed?: Yes Type of Diet Ordered:: heart healthy-soft diet-advance as tolerated Do you have support at home?: Yes People in Home: spouse Name of Support/Comfort Primary Source: [facility] and Equipment/Supplies: [place] Ordered?: NA Any new equipment or medical supplies ordered?: NA  Functional Questionnaire: Do you need assistance with bathing/showering or dressing?: No Do you need assistance with meal preparation?: No Do you need assistance with eating?: No Do you have difficulty maintaining continence: No Do you need assistance with getting out of bed/getting out of a chair/moving?: No Do you have difficulty managing or taking your medications?: No  Folllow up appointments reviewed: PCP Follow-up appointment confirmed?: Yes Date of PCP follow-up appointment?: 11/07/22 Follow-up Provider: [facility] Follow-up appointment confirmed?: NA Do you need transportation to your follow-up appointment?: No Do you  understand care options if your condition(s) worsen?: Yes-patient verbalized understanding  SDOH Interventions Today    Flowsheet Row Most Recent Value  SDOH Interventions   Food Insecurity Interventions Intervention Not Indicated  Transportation Interventions Intervention Not Indicated      TOC Interventions Today    Flowsheet Row Most Recent Value  TOC Interventions   TOC Interventions Discussed/Reviewed TOC Interventions Discussed, Post discharge activity limitations per provider, S/S of infection, Post op wound/incision care      Interventions Today    Flowsheet Row Most Recent Value  Education Interventions   Education Provided Provided Education  Provided Verbal Education On Nutrition, Medication, When to see the doctor  Nutrition Interventions   Nutrition Discussed/Reviewed Nutrition Discussed  [soft diet]  Pharmacy Interventions   Pharmacy Dicussed/Reviewed Pharmacy Topics Discussed  Safety Interventions   Safety Discussed/Reviewed Safety Discussed        Hetty Blend [facility] Care Management Care Management Community Coordinator Direct Phone: [phone] Toll Free: [phone] Fax: [phone]

## 2022-11-02 ENCOUNTER — Ambulatory Visit: Payer: Self-pay

## 2022-11-02 NOTE — Chronic Care Management (AMB) (Signed)
   11/02/2022  Shaun [name] 01-Aug-1956 [id number]   Reason for Encounter: Change in CCM enrollment status   Horris Latino RN Care Manager/Chronic Care Management [phone]

## 2022-11-07 ENCOUNTER — Ambulatory Visit (INDEPENDENT_AMBULATORY_CARE_PROVIDER_SITE_OTHER): Payer: Medicare Other | Admitting: Nurse Practitioner

## 2022-11-07 ENCOUNTER — Other Ambulatory Visit (HOSPITAL_BASED_OUTPATIENT_CLINIC_OR_DEPARTMENT_OTHER): Payer: Self-pay

## 2022-11-07 VITALS — BP 108/70 | HR 68 | Wt 217.4 lb

## 2022-11-07 DIAGNOSIS — E1165 Type 2 diabetes mellitus with hyperglycemia: Secondary | ICD-10-CM | POA: Diagnosis not present

## 2022-11-07 DIAGNOSIS — R251 Tremor, unspecified: Secondary | ICD-10-CM

## 2022-11-07 DIAGNOSIS — I25118 Atherosclerotic heart disease of native coronary artery with other forms of angina pectoris: Secondary | ICD-10-CM

## 2022-11-07 DIAGNOSIS — J439 Emphysema, unspecified: Secondary | ICD-10-CM | POA: Diagnosis not present

## 2022-11-07 DIAGNOSIS — K432 Incisional hernia without obstruction or gangrene: Secondary | ICD-10-CM

## 2022-11-07 DIAGNOSIS — G479 Sleep disorder, unspecified: Secondary | ICD-10-CM

## 2022-11-07 DIAGNOSIS — Z7901 Long term (current) use of anticoagulants: Secondary | ICD-10-CM

## 2022-11-07 DIAGNOSIS — G4709 Other insomnia: Secondary | ICD-10-CM

## 2022-11-07 DIAGNOSIS — E1122 Type 2 diabetes mellitus with diabetic chronic kidney disease: Secondary | ICD-10-CM

## 2022-11-07 DIAGNOSIS — D6859 Other primary thrombophilia: Secondary | ICD-10-CM

## 2022-11-07 DIAGNOSIS — I129 Hypertensive chronic kidney disease with stage 1 through stage 4 chronic kidney disease, or unspecified chronic kidney disease: Secondary | ICD-10-CM

## 2022-11-07 DIAGNOSIS — D500 Iron deficiency anemia secondary to blood loss (chronic): Secondary | ICD-10-CM | POA: Diagnosis not present

## 2022-11-07 DIAGNOSIS — K56609 Unspecified intestinal obstruction, unspecified as to partial versus complete obstruction: Secondary | ICD-10-CM

## 2022-11-07 DIAGNOSIS — I5032 Chronic diastolic (congestive) heart failure: Secondary | ICD-10-CM

## 2022-11-07 DIAGNOSIS — D508 Other iron deficiency anemias: Secondary | ICD-10-CM

## 2022-11-07 DIAGNOSIS — F321 Major depressive disorder, single episode, moderate: Secondary | ICD-10-CM

## 2022-11-07 DIAGNOSIS — I482 Chronic atrial fibrillation, unspecified: Secondary | ICD-10-CM

## 2022-11-07 MED ORDER — HYDROXYZINE HCL 25 MG PO TABS
25.0000 mg | ORAL_TABLET | Freq: Every evening | ORAL | 2 refills | Status: DC | PRN
  Filled 2022-11-07: qty 60, 30d supply, fill #0
  Filled 2022-11-30: qty 60, 30d supply, fill #1
  Filled 2022-12-30: qty 60, 30d supply, fill #2

## 2022-11-07 NOTE — Progress Notes (Unsigned)
Shaun Render, DNP, AGNP-c [place] [address] [place], [place] [address] [phone]  Subjective:   Shaun [name] is a 67 y.o. male presents to day for evaluation of:  Shaun [name] presents today, sharing that he has been feeling better following a challenging period with hernia surgery and subsequent small bowel obstruction requiring rehospitalization. He reports an improvement in his ability to use the bathroom without complications and describes his current discomfort as more akin to soreness and tenderness, rather than sharp pain in the abdomen. He notes a significant improvement in the area affected by the hernia. He confirms that he has not had any fevers.  Additionally, the patient mentions experiencing shortness of breath after returning home from surgery, but on readmission had a weight loss of 13 pounds due to excess fluid loss.   Regarding his medication, the patient is uncertain about any changes made during his recent surgery but verifies that he has resumed taking Eliquis. He estimates a discontinuation of Eliquis for approximately three to four days around the time of the surgery and states that he has not observed any signs of bleeding or blood clots.   The patient also discusses challenges with sleep, indicating that he often wakes up fully alert in the middle of the night and struggles to fall back asleep. Although he mentions sleeping better over the last two nights, he still experiences daytime sleepiness.  On the topic of nutrition, the patient mentions that he has only recently consumed a full meal, having had little appetite prior. He has been trying soups and small snacks.   He also reports concerns with chronic tremors, which he observes to worsen during moments of agitation. He tells me that sometimes while preparing dinner or eating his hands shake so badly, he is unable to hold a utensil. He confirms that he is currently taking Requip to address  this issue.  PMH, Medications, and Allergies reviewed and updated in chart as appropriate.   ROS negative except for what is listed in HPI. Objective:  BP 108/70   Pulse 68   Wt 217 lb 6.4 oz (98.6 kg)   BMI 33.06 kg/m  Physical Exam Vitals and nursing note reviewed.  Constitutional:      Appearance: Normal appearance.  HENT:     Head: Normocephalic.  Eyes:     Extraocular Movements: Extraocular movements intact.     Pupils: Pupils are equal, round, and reactive to light.  Neck:     Vascular: No carotid bruit.  Cardiovascular:     Rate and Rhythm: Normal rate. Rhythm irregular.     Pulses: Normal pulses.     Heart sounds: Normal heart sounds.  Pulmonary:     Effort: Pulmonary effort is normal.     Breath sounds: Normal breath sounds.  Abdominal:     General: Bowel sounds are normal. There is no distension.     Palpations: Abdomen is soft.     Tenderness: There is abdominal tenderness. There is no right CVA tenderness, left CVA tenderness or guarding.     Comments: Abdominal binder in place.   Musculoskeletal:        General: Normal range of motion.     Cervical back: Normal range of motion.     Right lower leg: No edema.     Left lower leg: No edema.  Lymphadenopathy:     Cervical: No cervical adenopathy.  Skin:    General: Skin is warm and dry.     Capillary  Refill: Capillary refill takes less than 2 seconds.  Neurological:     General: No focal deficit present.     Mental Status: He is alert and oriented to person, place, and time.  Psychiatric:        Mood and Affect: Mood normal.        Behavior: Behavior normal.        Thought Content: Thought content normal.        Judgment: Judgment normal.           Assessment & Plan:   Problem List Items Addressed This Visit     Chronic atrial fibrillation (HCC)    Atrial fibrillation present with rate control. He is on chronic anticoagulant and appears stable.  Plan: - continue current regimen - notify if  symptoms occur.       Hypertension associated with chronic kidney disease due to type 2 diabetes mellitus ([place])    Chronic hypertension well controlled at this time. Goal BP <130/80 Plan: - continue current medications - monitor blood pressure at least once a week and report readings consistently greater than 140/85      Type 2 diabetes mellitus with hyperglycemia, without long-term current use of insulin ([place])    Post-surgery, Shaun [name] has experienced a reduced appetite, mainly consuming soups and only recently managed a full meal. His protein levels are slightly low, but electrolytes and kidney function are normal. Blood sugar levels are stable at this time.  Plan: - Encourage nutritional supplementation with Ensure, Glucerna, or Boost to improve protein intake and overall nutrition. - Samples provided today - If unable to improve intake, Shaun [name] has been instructed to follow-up      COPD (chronic obstructive pulmonary disease) ([place])    No current symptoms present. Lung sounds are clear. Given recent surgery and multiple co-morbidities, I strongly recommend deep breathing exercises to help reduce risks of stagnation of mucous and risks of infection.  Plan: - monitor closely for increased mucous production, difficulty breathing, worsening shortness of breath, or new symptoms and report immediately.       Insomnia    Shaun [name] reports difficulty sleeping for the past two and a half weeks, with recent improvement. He wakes up in the middle of the night and cannot return to sleep. This has affected his daytime energy levels, leading to drowsiness. Plan: - Prescribe hydroxyzine to aid with sleep, to be taken alongside Trazodone. If no improvement is seen in a few days, Shaun [name] is advised to report back.      Chronic diastolic CHF (congestive heart failure) (HCC)    Recent acute exacerbation with repeat hospitalization following surgery. He was diuresed and a 13 pound weight loss. No signs of fluid volume  overload are present at this time. Lung sounds are clear with no LE edema present.  Plan: - continue to monitor weight daily and immediately report a 3lb or more weight gain overnight or more than 5lb in one week - continue to follow a low sodium intake. - report any symptoms of worsening edema or shortness of breath immediately.       Coronary artery disease of native artery of native heart with stable angina pectoris (HCC)    Chronic CAD. Concern for strain on the heart with recent exacerbation of HF and surgery. He appears to be doing very well at this time with no angina. Blood pressure is stable.  Plan: - continue current medications - notify of any new chest pain or any symptoms that are  alarming      Anemia    A slight drop in hemoglobin levels was noted post-surgery, which is not uncommon. At this time his coloration is appropriate and there are no alarm symptoms present.  Plan: - Check hemoglobin levels today to ensure they have not decreased further.      Relevant Orders   CBC with Differential/Platelet (Completed)   Iron, TIBC and Ferritin Panel (Completed)   Current moderate episode of major depressive disorder without prior episode [facility])    Shaun [name] is not having any signs of worsening depression at this time. Given the recent surgery and subsequent complications with additional hospitalization, risks of depressive episode are present.  Plan: - monitor closely for any signs of worsening depression and report immediately - continue current medication      Relevant Medications   hydrOXYzine (ATARAX) 25 MG tablet   Ventral incisional hernia without obstruction or gangrene    Shaun [name] is recovering well from hernia surgery, experiencing tenderness but no pain at the surgical site, and has noticed significant improvement post-surgery. No signs of infection or complications have been observed since experiencing the small bowel obstruction. He experienced shortness of breath initially,  attributed to fluid weight gain, but has since lost 13 pounds. No fevers have been reported. Shaun [name] is unsure of any medication changes during his hospital stay but has resumed taking Eliquis post-surgery. He is having normal BM with no concerns.  Plan: - Continue to monitor the surgical site for signs of infection or complications. - Follow up on post-surgical instructions regarding binder use as discussed with the surgeon.       SBO (small bowel obstruction) (HCC)    Recent SBO with hospitalization following surgery for hernia repair. Bowel sounds are active today with soft abdomen. He is having normal bowel movements with no concern present.  Plan: - continue to monitor closely - utilize colace if difficulty or straining to have BM      Hypercoagulable state [facility])    Shaun [name] is chronically anticoagulated for chronic atrial fibrillation. Recently anticoagulants were stopped for surgery, but have since been restarted. He is not having any symptoms of bleeding or blood clots at this time.  Plan: - continue current medication and monitor closely - report any signs of bleeding - report any signs of shortness of breath, chest pain, calf or arm pain/redness/warmth/tenderness/swelling immediately.       Tremor    Shaun [name] experiences tremors that worsen with agitation or during activities like eating, which may be essential tremors. He is currently taking Requip for this condition. At this time he does not feel this is well controlled. Concern is present with interference in ability to eat.  Plan: - Investigate alternative or additional treatments for tremor management. The provider will research and follow up. - Consider repeat evaluation with neurology for further investigation into cause and treatment options.       Other Visit Diagnoses     Sleep difficulties    -  Primary   Relevant Medications   hydrOXYzine (ATARAX) 25 MG tablet   Chronic anticoagulation             Shaun Render, DNP,  AGNP-c 11/09/2022  10:46 PM    History, Medications, Surgery, SDOH, and Family History reviewed and updated as appropriate.

## 2022-11-07 NOTE — Patient Instructions (Addendum)
I have sent in a new medication called Hydroxyzine that you can take at bedtime to see if this helps you stay asleep through the night. Give it a few nights, but if this is not helpful, please let me know.   Let me do some digging and see if I can find anything else that might help with the tremors.

## 2022-11-08 ENCOUNTER — Other Ambulatory Visit: Payer: Self-pay | Admitting: Nurse Practitioner

## 2022-11-08 ENCOUNTER — Other Ambulatory Visit (HOSPITAL_BASED_OUTPATIENT_CLINIC_OR_DEPARTMENT_OTHER): Payer: Self-pay

## 2022-11-08 ENCOUNTER — Other Ambulatory Visit (HOSPITAL_BASED_OUTPATIENT_CLINIC_OR_DEPARTMENT_OTHER): Payer: Self-pay | Admitting: Nurse Practitioner

## 2022-11-08 DIAGNOSIS — N471 Phimosis: Secondary | ICD-10-CM

## 2022-11-08 DIAGNOSIS — G4700 Insomnia, unspecified: Secondary | ICD-10-CM

## 2022-11-08 DIAGNOSIS — K430 Incisional hernia with obstruction, without gangrene: Secondary | ICD-10-CM | POA: Diagnosis not present

## 2022-11-08 DIAGNOSIS — Z9889 Other specified postprocedural states: Secondary | ICD-10-CM | POA: Diagnosis not present

## 2022-11-08 DIAGNOSIS — Z8719 Personal history of other diseases of the digestive system: Secondary | ICD-10-CM | POA: Diagnosis not present

## 2022-11-08 LAB — CBC WITH DIFFERENTIAL/PLATELET
Basophils Absolute: 0.1 10*3/uL (ref 0.0–0.2)
Basos: 1 %
EOS (ABSOLUTE): 0.1 10*3/uL (ref 0.0–0.4)
Eos: 1 %
Hematocrit: 39.4 % (ref 37.5–51.0)
Hemoglobin: 13.3 g/dL (ref 13.0–17.7)
Immature Grans (Abs): 0 10*3/uL (ref 0.0–0.1)
Immature Granulocytes: 0 %
Lymphocytes Absolute: 2 10*3/uL (ref 0.7–3.1)
Lymphs: 22 %
MCH: 28.1 pg (ref 26.6–33.0)
MCHC: 33.8 g/dL (ref 31.5–35.7)
MCV: 83 fL (ref 79–97)
Monocytes Absolute: 0.5 10*3/uL (ref 0.1–0.9)
Monocytes: 6 %
Neutrophils Absolute: 6.4 10*3/uL (ref 1.4–7.0)
Neutrophils: 70 %
Platelets: 227 10*3/uL (ref 150–450)
RBC: 4.73 x10E6/uL (ref 4.14–5.80)
RDW: 13.1 % (ref 11.6–15.4)
WBC: 9 10*3/uL (ref 3.4–10.8)

## 2022-11-08 LAB — IRON,TIBC AND FERRITIN PANEL
Ferritin: 87 ng/mL (ref 30–400)
Iron Saturation: 18 % (ref 15–55)
Iron: 65 ug/dL (ref 38–169)
Total Iron Binding Capacity: 353 ug/dL (ref 250–450)
UIBC: 288 ug/dL (ref 111–343)

## 2022-11-08 MED ORDER — SILDENAFIL CITRATE 100 MG PO TABS
ORAL_TABLET | ORAL | 0 refills | Status: DC
  Filled 2022-11-08: qty 8, 30d supply, fill #0

## 2022-11-08 NOTE — Telephone Encounter (Signed)
refill request last apt 11/07/22.

## 2022-11-09 ENCOUNTER — Other Ambulatory Visit: Payer: Self-pay | Admitting: Nurse Practitioner

## 2022-11-09 ENCOUNTER — Encounter: Payer: Self-pay | Admitting: Nurse Practitioner

## 2022-11-09 ENCOUNTER — Other Ambulatory Visit (HOSPITAL_BASED_OUTPATIENT_CLINIC_OR_DEPARTMENT_OTHER): Payer: Self-pay

## 2022-11-09 DIAGNOSIS — R251 Tremor, unspecified: Secondary | ICD-10-CM | POA: Insufficient documentation

## 2022-11-09 DIAGNOSIS — G4700 Insomnia, unspecified: Secondary | ICD-10-CM

## 2022-11-09 MED ORDER — TRAZODONE HCL 150 MG PO TABS
150.0000 mg | ORAL_TABLET | Freq: Every day | ORAL | 1 refills | Status: DC
  Filled 2022-11-09: qty 90, 90d supply, fill #0
  Filled 2023-02-04: qty 90, 90d supply, fill #1

## 2022-11-09 NOTE — Assessment & Plan Note (Signed)
Chronic hypertension well controlled at this time. Goal BP <130/80 Plan: - continue current medications - monitor blood pressure at least once a week and report readings consistently greater than 140/85

## 2022-11-09 NOTE — Assessment & Plan Note (Signed)
Shaun [name] is recovering well from hernia surgery, experiencing tenderness but no pain at the surgical site, and has noticed significant improvement post-surgery. No signs of infection or complications have been observed since experiencing the small bowel obstruction. He experienced shortness of breath initially, attributed to fluid weight gain, but has since lost 13 pounds. No fevers have been reported. Shaun [name] is unsure of any medication changes during his hospital stay but has resumed taking Eliquis post-surgery. He is having normal BM with no concerns.  Plan: - Continue to monitor the surgical site for signs of infection or complications. - Follow up on post-surgical instructions regarding binder use as discussed with the surgeon.

## 2022-11-09 NOTE — Assessment & Plan Note (Signed)
Shaun [name] is not having any signs of worsening depression at this time. Given the recent surgery and subsequent complications with additional hospitalization, risks of depressive episode are present.  Plan: - monitor closely for any signs of worsening depression and report immediately - continue current medication

## 2022-11-09 NOTE — Assessment & Plan Note (Signed)
Shaun [name] is chronically anticoagulated for chronic atrial fibrillation. Recently anticoagulants were stopped for surgery, but have since been restarted. He is not having any symptoms of bleeding or blood clots at this time.  Plan: - continue current medication and monitor closely - report any signs of bleeding - report any signs of shortness of breath, chest pain, calf or arm pain/redness/warmth/tenderness/swelling immediately.

## 2022-11-09 NOTE — Assessment & Plan Note (Signed)
Shaun [name] experiences tremors that worsen with agitation or during activities like eating, which may be essential tremors. He is currently taking Requip for this condition. At this time he does not feel this is well controlled. Concern is present with interference in ability to eat.  Plan: - Investigate alternative or additional treatments for tremor management. The provider will research and follow up. - Consider repeat evaluation with neurology for further investigation into cause and treatment options.

## 2022-11-09 NOTE — Assessment & Plan Note (Signed)
Recent acute exacerbation with repeat hospitalization following surgery. He was diuresed and a 13 pound weight loss. No signs of fluid volume overload are present at this time. Lung sounds are clear with no LE edema present.  Plan: - continue to monitor weight daily and immediately report a 3lb or more weight gain overnight or more than 5lb in one week - continue to follow a low sodium intake. - report any symptoms of worsening edema or shortness of breath immediately.

## 2022-11-09 NOTE — Assessment & Plan Note (Signed)
No current symptoms present. Lung sounds are clear. Given recent surgery and multiple co-morbidities, I strongly recommend deep breathing exercises to help reduce risks of stagnation of mucous and risks of infection.  Plan: - monitor closely for increased mucous production, difficulty breathing, worsening shortness of breath, or new symptoms and report immediately.

## 2022-11-09 NOTE — Assessment & Plan Note (Signed)
Post-surgery, Shaun [name] has experienced a reduced appetite, mainly consuming soups and only recently managed a full meal. His protein levels are slightly low, but electrolytes and kidney function are normal. Blood sugar levels are stable at this time.  Plan: - Encourage nutritional supplementation with Ensure, Glucerna, or Boost to improve protein intake and overall nutrition. - Samples provided today - If unable to improve intake, Shaun [name] has been instructed to follow-up

## 2022-11-09 NOTE — Assessment & Plan Note (Signed)
Atrial fibrillation present with rate control. He is on chronic anticoagulant and appears stable.  Plan: - continue current regimen - notify if symptoms occur.

## 2022-11-09 NOTE — Assessment & Plan Note (Signed)
Recent SBO with hospitalization following surgery for hernia repair. Bowel sounds are active today with soft abdomen. He is having normal bowel movements with no concern present.  Plan: - continue to monitor closely - utilize colace if difficulty or straining to have BM

## 2022-11-09 NOTE — Assessment & Plan Note (Signed)
Chronic CAD. Concern for strain on the heart with recent exacerbation of HF and surgery. He appears to be doing very well at this time with no angina. Blood pressure is stable.  Plan: - continue current medications - notify of any new chest pain or any symptoms that are alarming

## 2022-11-09 NOTE — Assessment & Plan Note (Signed)
A slight drop in hemoglobin levels was noted post-surgery, which is not uncommon. At this time his coloration is appropriate and there are no alarm symptoms present.  Plan: - Check hemoglobin levels today to ensure they have not decreased further.

## 2022-11-09 NOTE — Assessment & Plan Note (Signed)
Shaun [name] reports difficulty sleeping for the past two and a half weeks, with recent improvement. He wakes up in the middle of the night and cannot return to sleep. This has affected his daytime energy levels, leading to drowsiness. Plan: - Prescribe hydroxyzine to aid with sleep, to be taken alongside Trazodone. If no improvement is seen in a few days, Shaun [name] is advised to report back.

## 2022-11-12 ENCOUNTER — Other Ambulatory Visit (HOSPITAL_BASED_OUTPATIENT_CLINIC_OR_DEPARTMENT_OTHER): Payer: Self-pay

## 2022-11-14 ENCOUNTER — Other Ambulatory Visit (HOSPITAL_BASED_OUTPATIENT_CLINIC_OR_DEPARTMENT_OTHER): Payer: Self-pay

## 2022-12-01 ENCOUNTER — Encounter: Payer: Medicare Other | Attending: Physical Medicine & Rehabilitation | Admitting: Physical Medicine & Rehabilitation

## 2022-12-01 ENCOUNTER — Encounter: Payer: Self-pay | Admitting: Physical Medicine & Rehabilitation

## 2022-12-01 VITALS — BP 127/84 | HR 69 | Ht 68.0 in | Wt 220.0 lb

## 2022-12-01 DIAGNOSIS — M1611 Unilateral primary osteoarthritis, right hip: Secondary | ICD-10-CM | POA: Insufficient documentation

## 2022-12-01 NOTE — Progress Notes (Signed)
Subjective:    Patient ID: Shaun [name], male    DOB: 06/28/1956, 67 y.o.   MRN: [id number]  HPI 67 year old male with prior history of) left hip osteoarthritis.  He has been through physical therapy in 2023 for his hip.  Reduced hip extension was noted.  X-rays last performed 10/29/2022 as part of CT abdomen pelvis.  These demonstrated nearly complete absence of joint space on the right side.    Recent ventral hernia repair, had preoperative cardiology clearance.  The patient had very good results with right hip intra-articular injection performed approximately 1 year ago under fluoroscopic guidance.  He states that the effect wore off around 6 months ago. In addition he has chronic low back pain but it is not as bad as his right hip pain. Pain Inventory Average Pain 4 Pain Right Now 3 My pain is constant and stabbing  In the last 24 hours, has pain interfered with the following? General activity 4 Relation with others 3 Enjoyment of life 5 What TIME of day is your pain at its worst? daytime Sleep (in general) Poor  Pain is worse with: walking, bending, sitting, inactivity, and standing Pain improves with: rest, heat/ice, medication, and injections Relief from Meds: 5  Family History  Problem Relation Age of Onset   Hypertension Mother    Breast cancer Mother 2       brain/bone    Heart attack Father    Skin cancer Sister 67   Diabetes Brother    Parkinson's disease Brother    Brain cancer Maternal Uncle    Cancer Maternal Uncle        NOS   Breast cancer Cousin        maternal cousin dx <50   Cancer Cousin    Colon cancer Neg Hx    Social History   Socioeconomic History   Marital status: Married    Spouse name: [name] Brenn   Number of children: 5   Years of education: 12   Highest education level: 12th grade  Occupational History   Occupation: employed    Fish farm manager: Education officer, [employer]    Comment: full-time  Tobacco Use   Smoking status: Some Days     Packs/day: 0.50    Years: 39.00    Additional pack years: 0.00    Total pack years: 19.50    Types: Cigarettes    Start date: 07/24/1970    Last attempt to quit: 08/06/2021    Years since quitting: 1.3    Passive exposure: Past   Smokeless tobacco: Never  Vaping Use   Vaping Use: Never used  Substance and Sexual Activity   Alcohol use: No    Alcohol/week: 0.0 standard drinks of alcohol   Drug use: No   Sexual activity: Yes    Partners: Female  Other Topics Concern   Not on file  Social History Narrative   Not on file   Social Determinants of Health   Financial Resource Strain: Low Risk  (02/02/2022)   Overall Financial Resource Strain (CARDIA)    Difficulty of Paying Living Expenses: Not hard at all  Food Insecurity: No Food Insecurity (11/01/2022)   Hunger Vital Sign    Worried About Running Out of Food in the Last Year: Never true    Ran Out of Food in the Last Year: Never true  Transportation Needs: No Transportation Needs (11/01/2022)   PRAPARE - Hydrologist (Medical): No    Lack of Transportation (  Non-Medical): No  Physical Activity: Sufficiently Active (02/02/2022)   Exercise Vital Sign    Days of Exercise per Week: 5 days    Minutes of Exercise per Session: 40 min  Recent Concern: Physical Activity - Inactive (11/04/2021)   Exercise Vital Sign    Days of Exercise per Week: 0 days    Minutes of Exercise per Session: 0 min  Stress: No Stress Concern Present (02/02/2022)   [place]    Feeling of Stress : Not at all  Social Connections: Moderately Isolated (02/02/2022)   Social Connection and Isolation Panel [NHANES]    Frequency of Communication with Friends and Family: Three times a week    Frequency of Social Gatherings with Friends and Family: Three times a week    Attends Religious Services: Never    Active Member of Clubs or Organizations: No    Attends Archivist  Meetings: Never    Marital Status: Married   Past Surgical History:  Procedure Laterality Date   BIOPSY  07/17/2018   Procedure: BIOPSY;  Surgeon: Danie Binder, MD;  [facility];  Service: Endoscopy;;  colon   BOWEL RESECTION  09/17/2018   SMALL BOWEL RESECTION: 71 CM    CARDIAC CATHETERIZATION N/A 07/21/2015   Procedure: Left Heart Cath and Coronary Angiography;  Surgeon: Peter M Martinique, MD;  Location: [place] [facility];  Service: Cardiovascular;  Laterality: N/A;   CARDIAC CATHETERIZATION N/A 07/21/2015   Procedure: Coronary Stent Intervention;  Surgeon: Peter M Martinique, MD;  Location: [place] [facility];  Service: Cardiovascular;  Laterality: N/A;   CIRCUMCISION N/A 04/05/2019   Procedure: CIRCUMCISION ADULT;  Surgeon: Irine Seal, MD;  [facility];  Service: Urology;  Laterality: N/A;   COLONOSCOPY N/A 05/19/2014   Dr. Barnie Alderman diverticulosis/moderate external hemorrhoids, >20 simple adenomas. Genetic screening negative.    COLONOSCOPY WITH PROPOFOL N/A 07/17/2018   Dr. Oneida Alar: Diverticulosis, external/internal hemorrhoids, 32 colon polyps removed.  ten tubular adenomas removed with no high-grade dysplasia.  Advised to have surveillance colonoscopy in 3 years.   COLONOSCOPY WITH PROPOFOL N/A 06/07/2021   Procedure: COLONOSCOPY WITH PROPOFOL;  Surgeon: Eloise Harman, DO;  [facility];  Service: Endoscopy;  Laterality: N/A;  9:30 / ASA 3  (Pt was told that his time will be given at Pre-op)   CORONARY PRESSURE/FFR STUDY Left 06/08/2017   Procedure: INTRAVASCULAR PRESSURE WIRE/FFR STUDY;  Surgeon: Nelva Bush, MD;  Location: [place] [facility];  Service: Cardiovascular;  Laterality: Left;  LAD and CFX   ESOPHAGOGASTRODUODENOSCOPY (EGD) WITH PROPOFOL N/A 07/17/2018   Dr. Oneida Alar: Low-grade narrowing Schatzki ring at the GE junction status post dilation.  Gastritis.  Biopsy with mild nonspecific reactive gastropathy.  No H. pylori.   GIVENS CAPSULE STUDY  N/A 06/24/2019   normal   HERNIA REPAIR  1986   Left inguinal   INCISIONAL HERNIA REPAIR N/A 10/19/2022   Procedure: OPEN INCISIONAL HERNIA REPAIR;  Surgeon: Dwan Bolt, MD;  Location: [place];  Service: General;  Laterality: N/A;   INSERTION OF MESH N/A 10/19/2022   Procedure: INSERTION OF MESH;  Surgeon: Dwan Bolt, MD;  Location: [facility];  Service: General;  Laterality: N/A;   LAPAROTOMY N/A 09/17/2018   Procedure: EXPLORATORY LAPAROTOMY;  Surgeon: Virl Cagey, MD;  [facility];  Service: General;  Laterality: N/A;   LEFT HEART CATH AND CORONARY ANGIOGRAPHY N/A 06/08/2017   Procedure: LEFT HEART  CATH AND CORONARY ANGIOGRAPHY;  Surgeon: Nelva Bush, MD;  Location: [place] [facility];  Service: Cardiovascular;  Laterality: N/A;   POLYPECTOMY  07/17/2018   Procedure: POLYPECTOMY;  Surgeon: Danie Binder, MD;  [facility];  Service: Endoscopy;;  colon   POLYPECTOMY  06/07/2021   Procedure: POLYPECTOMY INTESTINAL;  Surgeon: Eloise Harman, DO;  [facility];  Service: Endoscopy;;   ROTATOR CUFF REPAIR     Right   SAVORY DILATION N/A 07/17/2018   Procedure: SAVORY DILATION;  Surgeon: Danie Binder, MD;  [facility];  Service: Endoscopy;  Laterality: N/A;   Past Surgical History:  Procedure Laterality Date   BIOPSY  07/17/2018   Procedure: BIOPSY;  Surgeon: Danie Binder, MD;  [facility];  Service: Endoscopy;;  colon   BOWEL RESECTION  09/17/2018   SMALL BOWEL RESECTION: 71 CM    CARDIAC CATHETERIZATION N/A 07/21/2015   Procedure: Left Heart Cath and Coronary Angiography;  Surgeon: Peter M Martinique, MD;  Location: [place] [facility];  Service: Cardiovascular;  Laterality: N/A;   CARDIAC CATHETERIZATION N/A 07/21/2015   Procedure: Coronary Stent Intervention;  Surgeon: Peter M Martinique, MD;  Location: [place] [facility];  Service: Cardiovascular;  Laterality: N/A;   CIRCUMCISION N/A 04/05/2019   Procedure: CIRCUMCISION  ADULT;  Surgeon: Irine Seal, MD;  [facility];  Service: Urology;  Laterality: N/A;   COLONOSCOPY N/A 05/19/2014   Dr. Barnie Alderman diverticulosis/moderate external hemorrhoids, >20 simple adenomas. Genetic screening negative.    COLONOSCOPY WITH PROPOFOL N/A 07/17/2018   Dr. Oneida Alar: Diverticulosis, external/internal hemorrhoids, 32 colon polyps removed.  ten tubular adenomas removed with no high-grade dysplasia.  Advised to have surveillance colonoscopy in 3 years.   COLONOSCOPY WITH PROPOFOL N/A 06/07/2021   Procedure: COLONOSCOPY WITH PROPOFOL;  Surgeon: Eloise Harman, DO;  [facility];  Service: Endoscopy;  Laterality: N/A;  9:30 / ASA 3  (Pt was told that his time will be given at Pre-op)   CORONARY PRESSURE/FFR STUDY Left 06/08/2017   Procedure: INTRAVASCULAR PRESSURE WIRE/FFR STUDY;  Surgeon: Nelva Bush, MD;  Location: [place] [facility];  Service: Cardiovascular;  Laterality: Left;  LAD and CFX   ESOPHAGOGASTRODUODENOSCOPY (EGD) WITH PROPOFOL N/A 07/17/2018   Dr. Oneida Alar: Low-grade narrowing Schatzki ring at the GE junction status post dilation.  Gastritis.  Biopsy with mild nonspecific reactive gastropathy.  No H. pylori.   GIVENS CAPSULE STUDY N/A 06/24/2019   normal   HERNIA REPAIR  1986   Left inguinal   INCISIONAL HERNIA REPAIR N/A 10/19/2022   Procedure: OPEN INCISIONAL HERNIA REPAIR;  Surgeon: Dwan Bolt, MD;  Location: [place];  Service: General;  Laterality: N/A;   INSERTION OF MESH N/A 10/19/2022   Procedure: INSERTION OF MESH;  Surgeon: Dwan Bolt, MD;  Location: [place];  Service: General;  Laterality: N/A;   LAPAROTOMY N/A 09/17/2018   Procedure: EXPLORATORY LAPAROTOMY;  Surgeon: Virl Cagey, MD;  [facility];  Service: General;  Laterality: N/A;   LEFT HEART CATH AND CORONARY ANGIOGRAPHY N/A 06/08/2017   Procedure: LEFT HEART CATH AND CORONARY ANGIOGRAPHY;  Surgeon: Nelva Bush, MD;  Location: [place] [facility];  Service:  Cardiovascular;  Laterality: N/A;   POLYPECTOMY  07/17/2018   Procedure: POLYPECTOMY;  Surgeon: Danie Binder, MD;  [facility];  Service: Endoscopy;;  colon   POLYPECTOMY  06/07/2021   Procedure: POLYPECTOMY INTESTINAL;  Surgeon: Eloise Harman,  DO;  [facility];  Service: Endoscopy;;   ROTATOR CUFF REPAIR     Right   SAVORY DILATION N/A 07/17/2018   Procedure: SAVORY DILATION;  Surgeon: Danie Binder, MD;  [facility];  Service: Endoscopy;  Laterality: N/A;   Past Medical History:  Diagnosis Date   Anemia    Anginal pain ([place])    Anxiety    Asthma    Bacterial URI 08/29/2022   CAD (coronary artery disease)    DES to distal circumflex 2016   Cataract    CHF (congestive heart failure) (HCC)    Colon polyps    30 colon polyps found on first colonoscopy   Depression    Diastolic heart failure (HCC)    Diverticulitis    DJD (degenerative joint disease)    Dyspnea    Dysrhythmia    A-Fib   Genetic testing 09/09/2014   Negative genetic testing on the ColoNext panel test and the MSH2 inversion testing.  The ColoNext gene panel offered by [facility] and includes sequencing and rearrangement analysis for the following 17 genes: APC, BMPR1A, CDH1, CHEK2, EPCAM, GREM1, MLH1, MSH2, MSH6, MUTYH, PMS2, POLD1, POLE, PTEN, SMAD4, STK11, and TP53.   The report date is 09/08/14.      GERD (gastroesophageal reflux disease)    History of kidney stones    Hyperlipidemia    Hypernatremia 08/22/2021   Hypertension    Insomnia    Obstructive sleep apnea 12/2009   01/26/2010 AHI 83/hr   Permanent atrial fibrillation ([employer])    Onset 2006 paroxysmal then progressive to persistent   Pneumonia    PUD (peptic ulcer disease)    1980s   RLS (restless legs syndrome)    Sinusitis    Skin cancer    Type 2 diabetes mellitus (HCC)    BP 127/84   Pulse 69   Ht 5\' 8"  (1.727 m)   Wt 220 lb (99.8 kg)   SpO2 96%   BMI 33.45 kg/m   Opioid Risk Score:    Fall Risk Score:  `1  Depression screen [facility] 2/9     11/07/2022    9:33 AM 05/31/2022    1:08 PM 04/19/2022   10:17 AM 03/22/2022    8:27 AM 02/08/2022    9:43 AM 02/02/2022    1:59 PM 02/02/2022    1:57 PM  Depression screen PHQ 2/9  Decreased Interest 0 0 0 2 0 0 0  Down, Depressed, Hopeless 0 0 1 2 0 0 0  PHQ - 2 Score 0 0 1 4 0 0 0  Altered sleeping  0 3 1     Tired, decreased energy  0 1 2     Change in appetite  0 2 1     Feeling bad or failure about yourself   0 1 2     Trouble concentrating  0 2 1     Moving slowly or fidgety/restless  0 0 0     Suicidal thoughts  0 0 1     PHQ-9 Score  0 10 12     Difficult doing work/chores   Somewhat difficult Not difficult at all       Review of Systems  Musculoskeletal:        Right hip pain  Neurological:  Positive for weakness.  All other systems reviewed and are negative.     Objective:   Physical Exam  Right hip absent internal and external rotation.  The patient has good hip flexion but lacks hip extension.  In fact he has a right hip flexion contracture of approximately 20 degrees. His gait is forward flexed and antalgic reduced stance phase on the right side. Negative straight leg raising bilaterally Lumbar spine has no tenderness palpation no pain with lumbar range of motion.      Assessment & Plan:   #1.  End-stage osteoarthritis of the right hip we discussed that while the intra-articular injection may be helpful in alleviating pain and inflammation it will not improve his range of motion.  I believe his low back pain is likely due to his gait deviation. We discussed that he will need orthopedic referral for right hip arthroplasty.  He has had recent abdominal surgery and does not wish to undergo surgical evaluation at this time.  We discussed that we can probably wait another year and in the meantime we will need to do intra-articular injections.  Do not think another round of physical therapy needed at this time a  fairly fixed contracture.

## 2022-12-06 ENCOUNTER — Other Ambulatory Visit: Payer: Self-pay | Admitting: Nurse Practitioner

## 2022-12-06 ENCOUNTER — Other Ambulatory Visit (HOSPITAL_BASED_OUTPATIENT_CLINIC_OR_DEPARTMENT_OTHER): Payer: Self-pay

## 2022-12-06 DIAGNOSIS — N471 Phimosis: Secondary | ICD-10-CM

## 2022-12-06 IMAGING — CT CT ABD-PELV W/O CM
1 of 2 series · 13 of 32 positions shown, 18 images · non-contrast
Comparison: 09/17/2018

CLINICAL DATA: Right ventral incisional hernia a few years ago with
worsening pain. History of bowel resection. History of inguinal
hernia repair.

EXAM:
CT ABDOMEN AND PELVIS WITHOUT CONTRAST
TECHNIQUE: Multidetector CT imaging of the abdomen and pelvis was performed
following the standard protocol without IV contrast.

[Series 2: abd/pelvis w/(date) · axial · 0.92mm/px · z∈[-507,-87]mm · 13 of 97 slices shown, 18 images]
[im 7/97  soft-tissue]
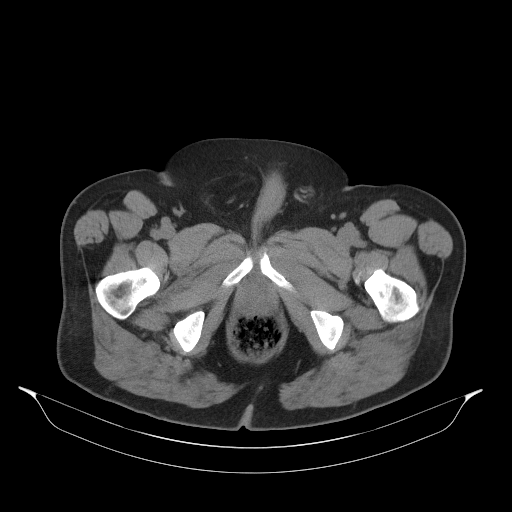
[im 7/97  bone]
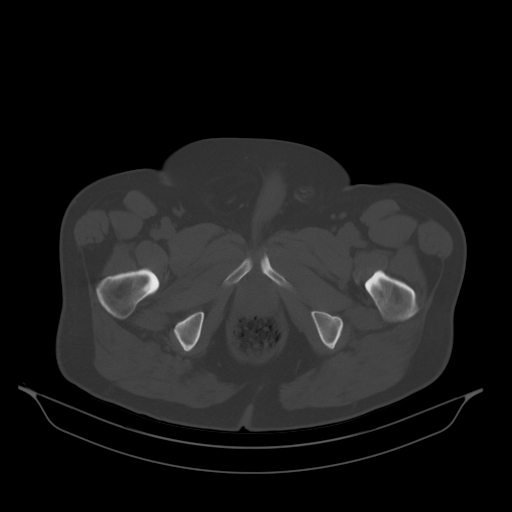
[im 13/97  soft-tissue]
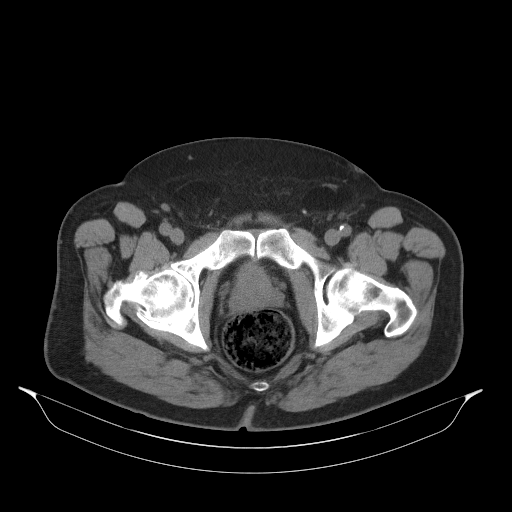
[im 25/97  soft-tissue]
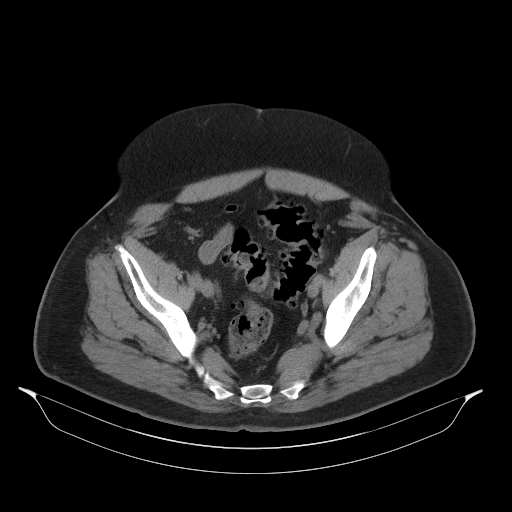
[im 31/97  soft-tissue]
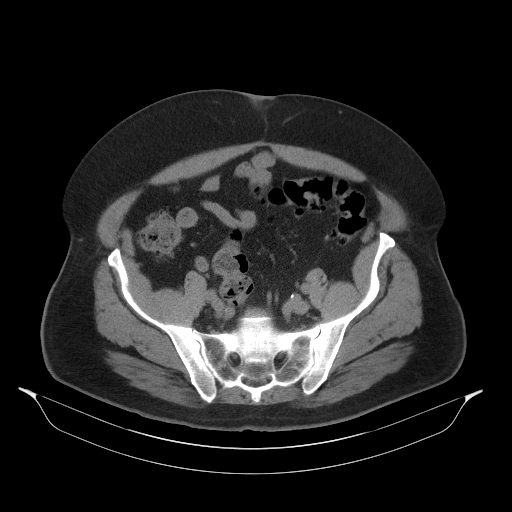
[im 37/97  soft-tissue]
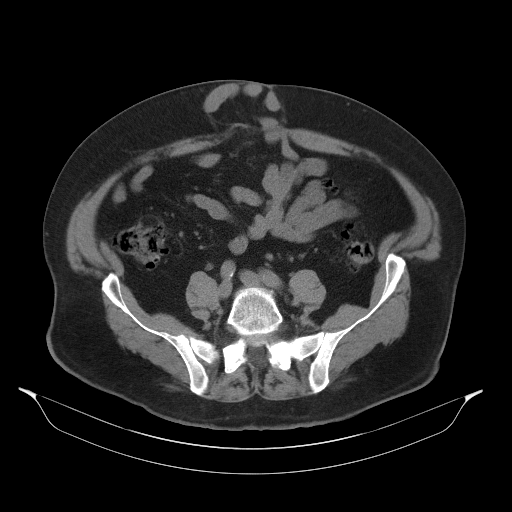
[im 43/97  soft-tissue]
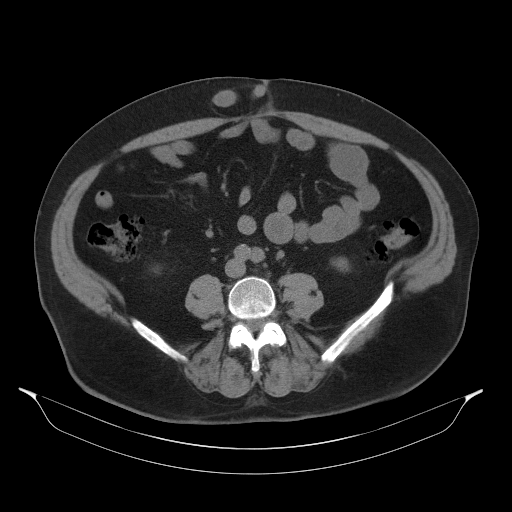
[im 55/97  soft-tissue]
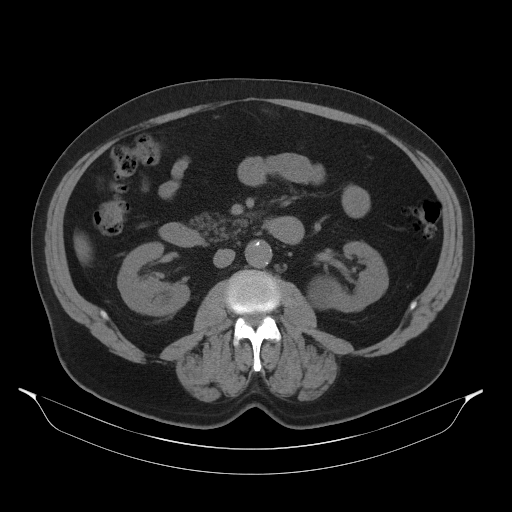
[im 61/97  soft-tissue]
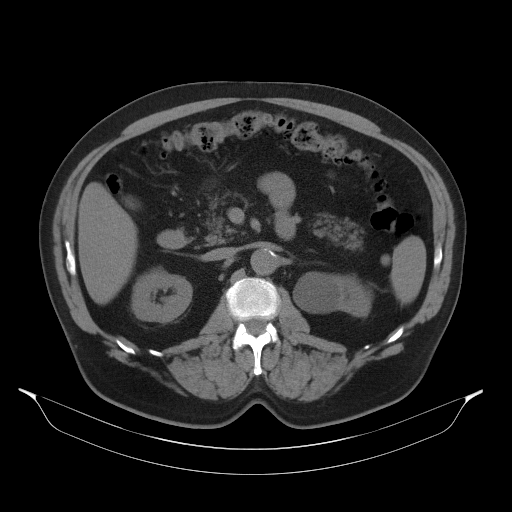
[im 67/97  soft-tissue]
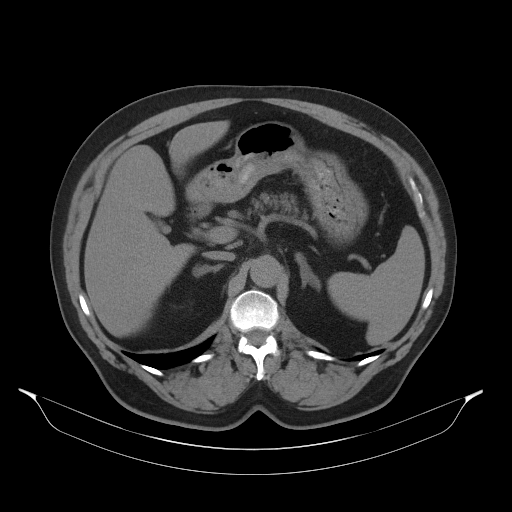
[im 67/97  bone]
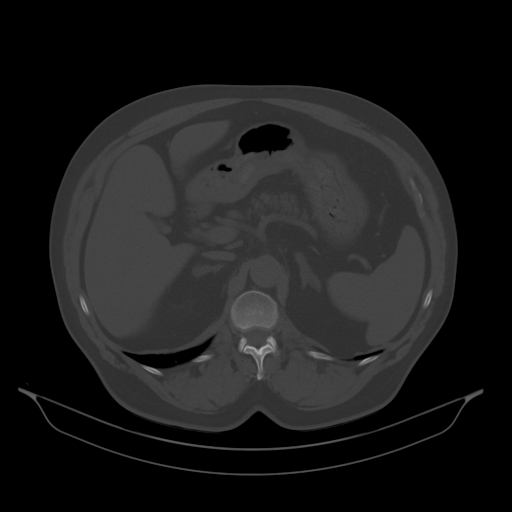
[im 73/97  soft-tissue]
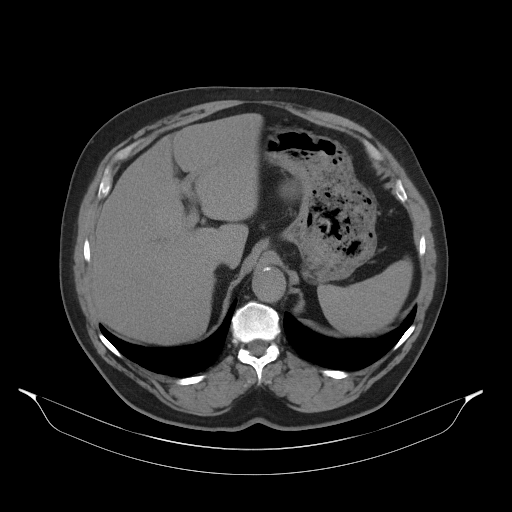
[im 73/97  lung]
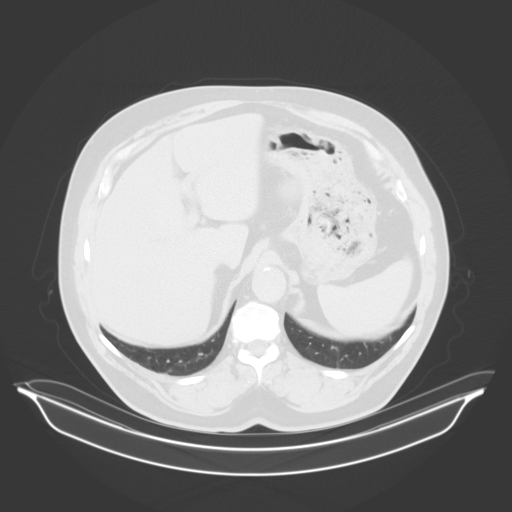
[im 79/97  lung]
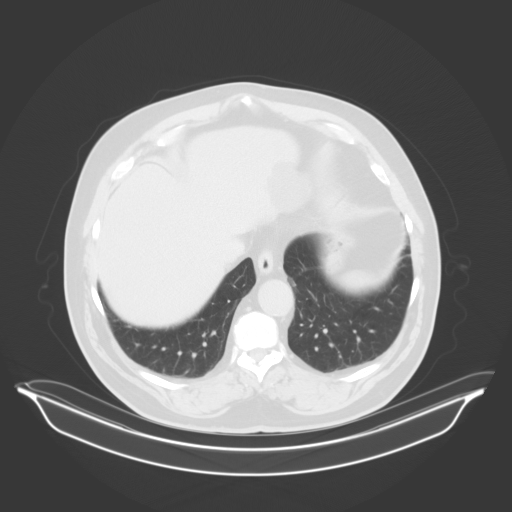
[im 85/97  soft-tissue]
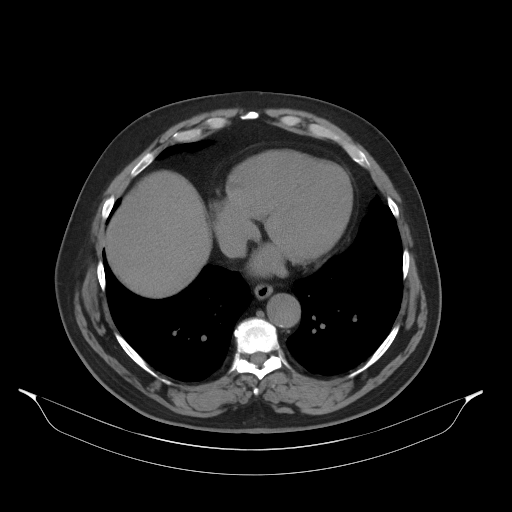
[im 85/97  lung]
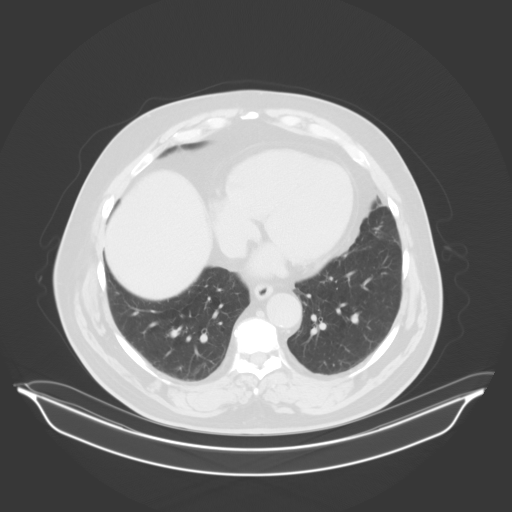
[im 91/97  soft-tissue]
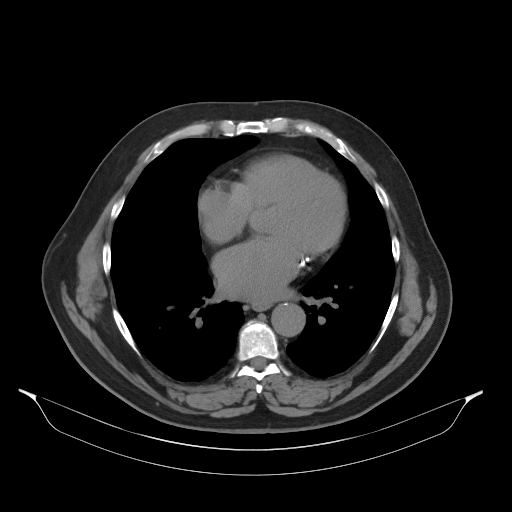
[im 91/97  lung]
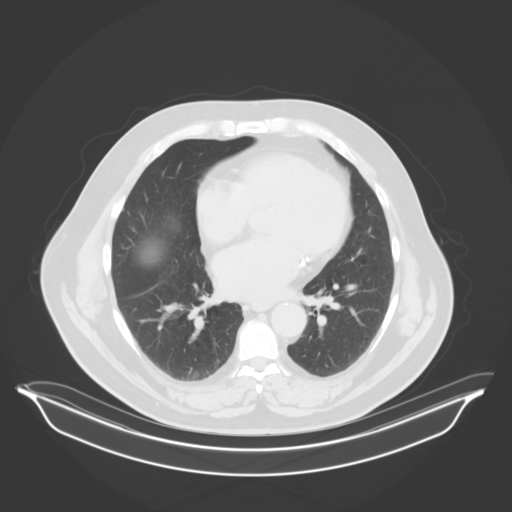

[13 of 32 positions shown; findings below may reference images not displayed]

FINDINGS: Lower chest: Lung bases are clear except for mild scarring. No
pleural effusion.

Hepatobiliary: Small calcified gallstones dependent in the
gallbladder similar to the previous study. 13 mm cyst at the dome of
the liver, smaller than on the previous study. 5.4 cm cyst the
lateral edge of lateral segment of the left lobe, enlarged since the
previous study.

Pancreas: Normal

Spleen: Normal

Adrenals/Urinary Tract: Adrenal glands are normal. Right kidney is
normal. Left renal cysts as seen previously, the largest projecting
medially measuring 4.4 cm in size, slightly enlarged since the prior
study. No obstruction. Parapelvic cyst previously seen has gotten
smaller.

Stomach/Bowel: Stomach appears normal. Previous small bowel
anastomosis without complicating feature. Diverticulosis of the
colon without evidence of diverticulitis. Ventral hernia defect
adjacent to the umbilicus with the defect itself measuring 5 cm. The
hernia contains fat and small intestine but there is no evidence of
incarceration. There are bilateral inguinal hernias containing only
fat, larger on the right than the left.

Vascular/Lymphatic: Aortic atherosclerosis. No aneurysm. IVC is
normal. No retroperitoneal adenopathy.

Reproductive: Normal

Other: No free fluid or air.

Musculoskeletal: Ordinary lower lumbar degenerative changes.
Degenerative arthritis the hips, right worse than left.
IMPRESSION: Periumbilical ventral hernia with the actual defect measuring 5 cm.
The hernia contains small intestine and mesentery. No evidence of
obstruction/incarceration.

Previous small bowel anastomosis without complicating feature by CT.

Bilateral inguinal hernias containing fat, larger on the right than
the left.

Chololithiasis without CT evidence of cholecystitis or obstruction.

Liver and renal cysts as outlined above.

Aortic Atherosclerosis ([id number].A).

Lower lumbar degenerative changes. Osteoarthritis of the hips, right
worse than left.

## 2022-12-06 MED ORDER — SILDENAFIL CITRATE 100 MG PO TABS
100.0000 mg | ORAL_TABLET | ORAL | 1 refills | Status: DC
  Filled 2022-12-06: qty 8, 30d supply, fill #0
  Filled 2023-01-10: qty 8, 30d supply, fill #1

## 2022-12-07 DIAGNOSIS — H40013 Open angle with borderline findings, low risk, bilateral: Secondary | ICD-10-CM | POA: Diagnosis not present

## 2022-12-12 ENCOUNTER — Other Ambulatory Visit (HOSPITAL_BASED_OUTPATIENT_CLINIC_OR_DEPARTMENT_OTHER): Payer: Self-pay

## 2022-12-12 ENCOUNTER — Other Ambulatory Visit: Payer: Self-pay

## 2022-12-12 ENCOUNTER — Other Ambulatory Visit: Payer: Self-pay | Admitting: Nurse Practitioner

## 2022-12-12 DIAGNOSIS — G2581 Restless legs syndrome: Secondary | ICD-10-CM

## 2022-12-12 MED ORDER — ROPINIROLE HCL 3 MG PO TABS
ORAL_TABLET | ORAL | 1 refills | Status: DC
  Filled 2022-12-12: qty 45, 30d supply, fill #0
  Filled 2023-01-11: qty 45, 30d supply, fill #1

## 2022-12-26 ENCOUNTER — Other Ambulatory Visit: Payer: Self-pay | Admitting: Nurse Practitioner

## 2022-12-26 DIAGNOSIS — M2550 Pain in unspecified joint: Secondary | ICD-10-CM

## 2022-12-26 NOTE — Telephone Encounter (Signed)
Refill request last apt 11/07/22 next apt 02/09/23.

## 2022-12-27 ENCOUNTER — Encounter: Payer: Self-pay | Admitting: Physical Medicine & Rehabilitation

## 2022-12-27 ENCOUNTER — Encounter (HOSPITAL_BASED_OUTPATIENT_CLINIC_OR_DEPARTMENT_OTHER): Payer: Medicare Other | Admitting: Physical Medicine & Rehabilitation

## 2022-12-27 VITALS — BP 120/86 | HR 93 | Temp 98.4°F | Ht 68.0 in | Wt 220.0 lb

## 2022-12-27 DIAGNOSIS — M1611 Unilateral primary osteoarthritis, right hip: Secondary | ICD-10-CM

## 2022-12-27 MED ORDER — IOHEXOL 180 MG/ML  SOLN
3.0000 mL | Freq: Once | INTRAMUSCULAR | Status: AC
  Administered 2022-12-27: 3 mL via INTRAVENOUS

## 2022-12-27 MED ORDER — BETAMETHASONE SOD PHOS & ACET 6 (3-3) MG/ML IJ SUSP
12.0000 mg | Freq: Once | INTRAMUSCULAR | Status: AC
  Administered 2022-12-27: 12 mg via INTRAMUSCULAR

## 2022-12-27 MED ORDER — LIDOCAINE HCL 1 % IJ SOLN: 5.0000 mL | Freq: Once | INTRAMUSCULAR | Status: AC

## 2022-12-27 MED ORDER — LIDOCAINE HCL (PF) 1 % IJ SOLN
2.0000 mL | Freq: Once | INTRAMUSCULAR | Status: AC
  Administered 2022-12-27: 2 mL

## 2022-12-27 NOTE — Patient Instructions (Signed)
Hip injection celestone and lidocaine 

## 2022-12-27 NOTE — Progress Notes (Signed)
  PROCEDURE RECORD [facility]   Name: NUEL DEJAYNES DOB:1956-05-22 MRN: [id number]  Date:12/27/2022  Physician: Claudette Laws, MD    Nurse/CMA: Shirline Kendle RMA   Allergies: No Known Allergies  Consent Signed: Yes.    Is patient diabetic? Yes.    CBG today? 137  Pregnant: No. LMP: No LMP for male patient. (age [age])  Anticoagulants: yes (eliquis ) Anti-inflammatory: no Antibiotics: no  Procedure: Right Hip Fluoro  Position: Supine Start Time: 1:54PM  End Time: 01:56  Fluoro Time: 21  RN/CMA [place] RMA  Sonya S    Time 1:25 2:00    BP 120/86 137/84    Pulse 93 89    Respirations 16 16    O2 Sat 94 92    S/S 6 6    Pain Level 3/10 2     D/C home with Wife, patient A & O X 3, D/C instructions reviewed, and sits independently.

## 2022-12-27 NOTE — Progress Notes (Signed)
RIght hip intra-articular injection under fluoro guidance  Indication osteoarthritis unresponsive to conservative care including exercise and oral medications  Informed consent was obtained after describing risks and benefits of the procedure, this includes bleeding bruising and infection. The patient elected to proceed and has given written consent  Placed supine on fluoroscopy table.  Betadine prep to groin area.  Imaging to identify the intertrochanteric line at the base of the neck of the femur. 5 cc of 1% lidocaine were infiltrated into the skin and subcu tissue using 25-gauge 1.5 inch needle.  Then a 22-gauge 5" needle was inserted under fluoroscopic guidance targeting the junction of the  Right  femoral head and femoral neck area.  Bone contact was made.  Isovue 200 times a total of 3 cc were injected needle was adjusted to achieve intra-articular location. Then a solution containing 1 cc of 6 mg/cc Celestone and 4 cc of 1% lidocaine were injected.  Patient tolerated procedure well post procedure instructions given  

## 2022-12-28 ENCOUNTER — Other Ambulatory Visit (HOSPITAL_BASED_OUTPATIENT_CLINIC_OR_DEPARTMENT_OTHER): Payer: Self-pay

## 2022-12-28 DIAGNOSIS — Z8719 Personal history of other diseases of the digestive system: Secondary | ICD-10-CM | POA: Diagnosis not present

## 2022-12-28 DIAGNOSIS — K430 Incisional hernia with obstruction, without gangrene: Secondary | ICD-10-CM | POA: Diagnosis not present

## 2022-12-28 MED ORDER — HYDROCODONE-ACETAMINOPHEN 5-325 MG PO TABS
1.0000 | ORAL_TABLET | Freq: Two times a day (BID) | ORAL | 0 refills | Status: DC | PRN
  Filled 2022-12-28: qty 120, 30d supply, fill #0

## 2022-12-28 MED ORDER — TIZANIDINE HCL 4 MG PO TABS
4.0000 mg | ORAL_TABLET | Freq: Two times a day (BID) | ORAL | 0 refills | Status: DC | PRN
  Filled 2022-12-28: qty 60, 30d supply, fill #0

## 2023-01-10 ENCOUNTER — Other Ambulatory Visit (HOSPITAL_BASED_OUTPATIENT_CLINIC_OR_DEPARTMENT_OTHER): Payer: Self-pay

## 2023-01-12 ENCOUNTER — Other Ambulatory Visit (HOSPITAL_BASED_OUTPATIENT_CLINIC_OR_DEPARTMENT_OTHER): Payer: Self-pay

## 2023-01-12 ENCOUNTER — Other Ambulatory Visit (INDEPENDENT_AMBULATORY_CARE_PROVIDER_SITE_OTHER): Payer: Medicare Other

## 2023-01-12 ENCOUNTER — Telehealth (INDEPENDENT_AMBULATORY_CARE_PROVIDER_SITE_OTHER): Payer: Medicare Other | Admitting: Medical

## 2023-01-12 ENCOUNTER — Encounter: Payer: Self-pay | Admitting: Medical

## 2023-01-12 ENCOUNTER — Ambulatory Visit (HOSPITAL_BASED_OUTPATIENT_CLINIC_OR_DEPARTMENT_OTHER)
Admission: RE | Admit: 2023-01-12 | Discharge: 2023-01-12 | Disposition: A | Discharge status: Home / Self Care | Payer: Medicare Other | Source: Ambulatory Visit | Attending: Medical | Admitting: Medical

## 2023-01-12 VITALS — BP 124/78 | HR 108 | Temp 98.9°F | Resp 16 | Wt 217.0 lb

## 2023-01-12 DIAGNOSIS — E1165 Type 2 diabetes mellitus with hyperglycemia: Secondary | ICD-10-CM

## 2023-01-12 DIAGNOSIS — R059 Cough, unspecified: Secondary | ICD-10-CM | POA: Diagnosis not present

## 2023-01-12 DIAGNOSIS — I5032 Chronic diastolic (congestive) heart failure: Secondary | ICD-10-CM | POA: Diagnosis not present

## 2023-01-12 DIAGNOSIS — R051 Acute cough: Secondary | ICD-10-CM

## 2023-01-12 DIAGNOSIS — Z7984 Long term (current) use of oral hypoglycemic drugs: Secondary | ICD-10-CM

## 2023-01-12 DIAGNOSIS — F17200 Nicotine dependence, unspecified, uncomplicated: Secondary | ICD-10-CM

## 2023-01-12 DIAGNOSIS — J441 Chronic obstructive pulmonary disease with (acute) exacerbation: Secondary | ICD-10-CM | POA: Diagnosis not present

## 2023-01-12 DIAGNOSIS — J988 Other specified respiratory disorders: Secondary | ICD-10-CM | POA: Diagnosis not present

## 2023-01-12 DIAGNOSIS — J45909 Unspecified asthma, uncomplicated: Secondary | ICD-10-CM

## 2023-01-12 DIAGNOSIS — I482 Chronic atrial fibrillation, unspecified: Secondary | ICD-10-CM | POA: Diagnosis not present

## 2023-01-12 DIAGNOSIS — R0602 Shortness of breath: Secondary | ICD-10-CM

## 2023-01-12 LAB — POC COVID19 BINAXNOW: SARS Coronavirus 2 Ag: NEGATIVE

## 2023-01-12 MED ORDER — ALBUTEROL SULFATE HFA 108 (90 BASE) MCG/ACT IN AERS
INHALATION_SPRAY | RESPIRATORY_TRACT | 1 refills | Status: AC
  Filled 2023-01-12: qty 6.7, 25d supply, fill #0

## 2023-01-12 MED ORDER — BREZTRI AEROSPHERE 160-9-4.8 MCG/ACT IN AERO: 2.0000 | INHALATION_SPRAY | Freq: Two times a day (BID) | RESPIRATORY_TRACT | 0 refills | Status: DC

## 2023-01-12 MED ORDER — AMOXICILLIN 875 MG PO TABS
875.0000 mg | ORAL_TABLET | Freq: Two times a day (BID) | ORAL | 0 refills | Status: AC
  Filled 2023-01-12: qty 20, 10d supply, fill #0

## 2023-01-12 NOTE — Patient Instructions (Signed)
Please go to [facility] for your chest xray.   Their hours are 8am - 4:30 pm Monday - Friday.  Take your insurance card with you.  [facility] [phone]  [address] [place], [place] [address] 

## 2023-01-12 NOTE — Progress Notes (Signed)
Subjective:     Patient ID: Shaun [name], male   DOB: 11-20-1955, 67 y.o.   MRN: [id number]  This visit type was conducted due to national recommendations for restrictions regarding the COVID-19 Pandemic (e.g. social distancing) in an effort to limit this patient's exposure and mitigate transmission in our community.  Due to their co-morbid illnesses, this patient is at least at moderate risk for complications without adequate follow up.  This format is felt to be most appropriate for this patient at this time.    Documentation for virtual audio and video telecommunications through [facility] encounter:  The patient was located at home. The provider was located in the office. The patient did consent to this visit and is aware of possible charges through their insurance for this visit.  The other persons participating in this telemedicine service were none. Time spent on call was 20 minutes and in review of previous records 20 minutes total.  This virtual service is not related to other E/M service within previous 7 days.   HPI Chief Complaint  Patient presents with   Acute Visit    Chest congestion, cough with clear to slightly yellow mucous, and sore throat for 2-3 days   Virtual for cough.   He reports cough, chest congestion, some yellow mucous.   Has sore throat.  Worse in the last 24 hours.   Has had low grade fever.  No body aches or chills.  No NVD.   Feels some SOB.  Has sinus pressure.   No ear pain.   Coughing a lot.  Using albuterol 3-4 times daily the last few days.  Has hx/o asthma.  Is a smoker.  No recent covid test.    Last URI last year.  Few years ago was hospitalized, on ventilator for about 11 days.  No other aggravating or relieving factors. No other complaint.   Past Medical History:  Diagnosis Date   Anemia    Anginal pain (HCC)    Anxiety    Asthma    Bacterial URI 08/29/2022   CAD (coronary artery disease)    DES to distal circumflex 2016    Cataract    CHF (congestive heart failure) (HCC)    Colon polyps    30 colon polyps found on first colonoscopy   Depression    Diastolic heart failure (HCC)    Diverticulitis    DJD (degenerative joint disease)    Dyspnea    Dysrhythmia    A-Fib   Genetic testing 09/09/2014   Negative genetic testing on the ColoNext panel test and the MSH2 inversion testing.  The ColoNext gene panel offered by [facility] and includes sequencing and rearrangement analysis for the following 17 genes: APC, BMPR1A, CDH1, CHEK2, EPCAM, GREM1, MLH1, MSH2, MSH6, MUTYH, PMS2, POLD1, POLE, PTEN, SMAD4, STK11, and TP53.   The report date is 09/08/14.      GERD (gastroesophageal reflux disease)    History of kidney stones    Hyperlipidemia    Hypernatremia 08/22/2021   Hypertension    Insomnia    Obstructive sleep apnea 12/2009   01/26/2010 AHI 83/hr   Permanent atrial fibrillation (HCC)    Onset 2006 paroxysmal then progressive to persistent   Pneumonia    PUD (peptic ulcer disease)    1980s   RLS (restless legs syndrome)    Sinusitis    Skin cancer    Type 2 diabetes mellitus (HCC)    Current Outpatient Medications on File Prior to Visit  Medication Sig Dispense Refill   apixaban (ELIQUIS) 5 MG TABS tablet TAKE 1 TABLET (5 MG) BY MOUTH TWICE DAILY 180 tablet 2   atorvastatin (LIPITOR) 80 MG tablet Take 1 tablet (80 mg total) by mouth daily. TAKE 1 TABLET(80 MG) BY MOUTH EVERY EVENING 90 tablet 2   azelastine (ASTELIN) 0.1 % nasal spray Place 1 spray into both nostrils 2 (two) times daily. Use in each nostril as directed (Patient taking differently: Place 1 spray into both nostrils daily as needed for allergies. Use in each nostril as directed) 30 mL 12   buPROPion (WELLBUTRIN XL) 150 MG 24 hr tablet Take 3 tablets once daily (Patient taking differently: Take 150 mg by mouth 3 (three) times daily.) 270 tablet 3   docusate sodium (COLACE) 100 MG capsule Take 1 capsule (100 mg total) by mouth daily as  needed for mild constipation. (Patient taking differently: Take 100 mg by mouth daily.) 100 capsule 3   ezetimibe (ZETIA) 10 MG tablet Take 1 tablet (10 mg total) by mouth daily. 90 tablet 3   ferrous sulfate 325 (65 FE) MG tablet Take 325 mg by mouth daily with breakfast.     furosemide (LASIX) 40 MG tablet Take 1 tablet (40 mg total) by mouth daily. 90 tablet 2   HYDROcodone-acetaminophen (NORCO/VICODIN) 5-325 MG tablet Take 1-2 tablets by mouth 2 (two) times daily as needed for moderate pain or severe pain. 120 tablet 0   hydrOXYzine (ATARAX) 25 MG tablet Take 1 tablet (25 mg total) by mouth at bedtime and may repeat dose one time if needed. For sleep. 60 tablet 2   magnesium 30 MG tablet Take 30 mg by mouth in the morning.     metFORMIN (GLUCOPHAGE) 500 MG tablet Take 1 tablet (500 mg total) by mouth 2 (two) times daily before a meal. 180 tablet 3   Metoprolol Tartrate 75 MG TABS Take 75 mg by mouth 2 (two) times daily. 120 tablet 3   nitroGLYCERIN (NITROSTAT) 0.4 MG SL tablet Place 1 tablet (0.4 mg total) under the tongue every 5 (five) minutes as needed. 25 tablet 3   omeprazole (PRILOSEC) 40 MG capsule Take 1 capsule by mouth once daily 30 minutes before breakfast 90 capsule 3   polyethylene glycol (MIRALAX / GLYCOLAX) 17 g packet Take 17 g by mouth daily as needed for mild constipation. 14 each 0   potassium chloride SA (KLOR-CON M) 20 MEQ tablet Take 1 tablet (20 mEq total) by mouth daily. 90 tablet 3   rOPINIRole (REQUIP) 3 MG tablet Take 1 tab nightly for RLS and a 1/2 tab during the day to control breakthrough symptoms 45 tablet 1   sildenafil (VIAGRA) 100 MG tablet Take 1 tablet (100 mg total) by mouth 30 minutes before activity 8 tablet 1   sucralfate (CARAFATE) 1 g tablet Take one tablet po BID PRN 42 tablet 5   tiZANidine (ZANAFLEX) 4 MG tablet Take 1 tablet (4 mg total) by mouth 2 (two) times daily as needed for muscle spasms. 60 tablet 0   traZODone (DESYREL) 150 MG tablet Take 1  tablet (150 mg total) by mouth at bedtime. 90 tablet 1   Current Facility-Administered Medications on File Prior to Visit  Medication Dose Route Frequency Provider Last Rate Last Admin   lidocaine (XYLOCAINE) 1 % (with pres) injection 5 mL  5 mL Other Once Kirsteins, Victorino Sparrow, MD        Review of Systems As in subjective    Objective:  Physical Exam Due to coronavirus pandemic stay at home measures, patient visit was virtual and they were not examined in person.   BP 124/78   Pulse (!) 108   Temp 98.9 F (37.2 C)   Resp 16   Wt 217 lb (98.4 kg)   SpO2 98%   BMI 32.99 kg/m   Gen: wd, wn, nad No labored breathing but coughing a lot He notes pulse usually higher around 100 on regular basis, and oxygen usually 96% room air.  Not on oxygen.  In person exam later in the morning: General: Somewhat slightly diaphoretic, well-developed well-nourished, no acute distress HEENT unremarkable,  lungs decreased throughout, no rhonchi or rales, but there is some slight wheezes throughout Heart regular rate and rhythm, no murmurs normal S1 and S2 No extremity edema no cyanosis or clubbing  neck supple nontender no lymphadenopathy Mucous membranes relatively moist      Assessment:     Encounter Diagnoses  Name Primary?   Acute cough Yes   Respiratory tract infection    Moderate asthma without complication, unspecified whether persistent    Chronic diastolic CHF (congestive heart failure) (HCC)    Chronic atrial fibrillation (HCC)    Tobacco use disorder    Type 2 diabetes mellitus with hyperglycemia, without long-term current use of insulin (HCC)    SOB (shortness of breath)    COPD exacerbation (HCC)        Plan:     We discussed symptoms and concerns, his underlying significant health issues, history of asthma, current shortness of breath.  I asked him to come to our office for in person eval.  He will head this way so we can do a COVID test and do an in person examination.   We discussed we may need to send him for chest x-ray.  He came into the office setting.  COVID test was negative.  Labs and chest x-ray otherwise as below.  Begin medications below, gave him a Breztri inhaler sample to use for the next week, continue Coricidin over-the-counter and good hydration  Much worse in the next day or 2 , then go to the hospital emergency department.  [place] was seen today for acute visit.  Diagnoses and all orders for this visit:  Acute cough -     CBC with Differential/Platelet -     Basic metabolic panel -     DG Chest 2 View; Future  Respiratory tract infection -     CBC with Differential/Platelet -     Basic metabolic panel -     DG Chest 2 View; Future  Moderate asthma without complication, unspecified whether persistent -     CBC with Differential/Platelet -     Basic metabolic panel -     DG Chest 2 View; Future  Chronic diastolic CHF (congestive heart failure) (HCC)  Chronic atrial fibrillation (HCC)  Tobacco use disorder  Type 2 diabetes mellitus with hyperglycemia, without long-term current use of insulin (HCC)  SOB (shortness of breath)  COPD exacerbation (HCC) -     albuterol (VENTOLIN HFA) 108 (90 Base) MCG/ACT inhaler; INHALE 2 PUFFS INTO THE LUNGS EVERY 6 HOURS AS NEEDED FOR WHEEZING OR SHORTNESS OF BREATH  Other orders -     amoxicillin (AMOXIL) 875 MG tablet; Take 1 tablet (875 mg total) by mouth 2 (two) times daily for 10 days. -     Budeson-Glycopyrrol-Formoterol (BREZTRI AEROSPHERE) 160-9-4.8 MCG/ACT AERO; Inhale 2 puffs into the lungs 2 (two) times daily.  F/u pending labs, cxr

## 2023-01-13 LAB — CBC WITH DIFFERENTIAL/PLATELET
Basophils Absolute: 0 10*3/uL (ref 0.0–0.2)
Basos: 0 %
EOS (ABSOLUTE): 0 10*3/uL (ref 0.0–0.4)
Eos: 1 %
Hematocrit: 37.4 % — ABNORMAL LOW (ref 37.5–51.0)
Hemoglobin: 12.5 g/dL — ABNORMAL LOW (ref 13.0–17.7)
Immature Grans (Abs): 0 10*3/uL (ref 0.0–0.1)
Immature Granulocytes: 0 %
Lymphocytes Absolute: 0.8 10*3/uL (ref 0.7–3.1)
Lymphs: 15 %
MCH: 27.7 pg (ref 26.6–33.0)
MCHC: 33.4 g/dL (ref 31.5–35.7)
MCV: 83 fL (ref 79–97)
Monocytes Absolute: 0.4 10*3/uL (ref 0.1–0.9)
Monocytes: 7 %
Neutrophils Absolute: 4.5 10*3/uL (ref 1.4–7.0)
Neutrophils: 77 %
Platelets: 154 10*3/uL (ref 150–450)
RBC: 4.52 x10E6/uL (ref 4.14–5.80)
RDW: 14.3 % (ref 11.6–15.4)
WBC: 5.8 10*3/uL (ref 3.4–10.8)

## 2023-01-13 LAB — BASIC METABOLIC PANEL
BUN/Creatinine Ratio: 15 (ref 10–24)
BUN: 14 mg/dL (ref 8–27)
CO2: 23 mmol/L (ref 20–29)
Calcium: 8.9 mg/dL (ref 8.6–10.2)
Chloride: 103 mmol/L (ref 96–106)
Creatinine, Ser: 0.92 mg/dL (ref 0.76–1.27)
Glucose: 115 mg/dL — ABNORMAL HIGH (ref 70–99)
Potassium: 4.4 mmol/L (ref 3.5–5.2)
Sodium: 140 mmol/L (ref 134–144)
eGFR: 92 mL/min/{1.73_m2} (ref >59)

## 2023-01-13 NOTE — Progress Notes (Signed)
Your electrolytes are okay, your blood counts are stable.  White counts were not elevated thankfully.  Continue with the recommendations and medication as discussed yesterday

## 2023-01-16 ENCOUNTER — Ambulatory Visit: Payer: Medicare Other | Admitting: Internal Medicine

## 2023-01-16 ENCOUNTER — Other Ambulatory Visit: Payer: Self-pay | Admitting: Internal Medicine

## 2023-01-16 ENCOUNTER — Encounter: Payer: Self-pay | Admitting: Internal Medicine

## 2023-01-16 VITALS — BP 120/80 | HR 110 | Ht 68.0 in | Wt 218.0 lb

## 2023-01-16 DIAGNOSIS — E0590 Thyrotoxicosis, unspecified without thyrotoxic crisis or storm: Secondary | ICD-10-CM | POA: Diagnosis not present

## 2023-01-16 NOTE — Patient Instructions (Signed)
Please have labs done at [facility] :   Address : [address], [address], [place], [place] [address] Phone [phone]  

## 2023-01-16 NOTE — Progress Notes (Unsigned)
Name: Shaun [name]  MRN/ DOB: [id number], 05-20-1956    Age/ Sex: 67 y.o., male    PCP: Jac Canavan, PA-C   Reason for Endocrinology Evaluation: Subclinical hyperthyroidism     Date of Initial Endocrinology Evaluation: 01/16/2023     HPI: Shaun [name] is a 67 y.o. male with a past medical history of atrial fibrillation, CAD, HTN, CHF, dyslipidemia, DM. The patient presented for initial endocrinology clinic visit on 01/16/2023 for consultative assistance with his subclinical hyperthyroidism.   Patient has been noted with low TSH 03/2022 at <0.01 u IU/mL, with repeat TSH of 0.033u IU/mL 05/2022.  Of note the patient has had normal free T4 and T3   Patient follows with cardiology for A-fib, CAD, and CHF   He is not on amiodarone, except for amiodarone infusion 07/2021   He is accompanied by his spouse today  Weight has been fluctuating  Denies local neck swelling  Has chronic palpitations  Denies diarrhea or loose stools  Has chronic tremors for ~ 4/5 years  No Biotin  Has occasional heat intolerance  He has fatigue and anxeity   No Fh of thyroid disease   HISTORY:  Past Medical History:  Past Medical History:  Diagnosis Date   Anemia    Anginal pain (HCC)    Anxiety    Asthma    Bacterial URI 08/29/2022   CAD (coronary artery disease)    DES to distal circumflex 2016   Cataract    CHF (congestive heart failure) (HCC)    Colon polyps    30 colon polyps found on first colonoscopy   Depression    Diastolic heart failure (HCC)    Diverticulitis    DJD (degenerative joint disease)    Dyspnea    Dysrhythmia    A-Fib   Genetic testing 09/09/2014   Negative genetic testing on the ColoNext panel test and the MSH2 inversion testing.  The ColoNext gene panel offered by [facility] and includes sequencing and rearrangement analysis for the following 17 genes: APC, BMPR1A, CDH1, CHEK2, EPCAM, GREM1, MLH1, MSH2, MSH6, MUTYH, PMS2, POLD1, POLE,  PTEN, SMAD4, STK11, and TP53.   The report date is 09/08/14.      GERD (gastroesophageal reflux disease)    History of kidney stones    Hyperlipidemia    Hypernatremia 08/22/2021   Hypertension    Insomnia    Obstructive sleep apnea 12/2009   01/26/2010 AHI 83/hr   Permanent atrial fibrillation (HCC)    Onset 2006 paroxysmal then progressive to persistent   Pneumonia    PUD (peptic ulcer disease)    1980s   RLS (restless legs syndrome)    Sinusitis    Skin cancer    Type 2 diabetes mellitus (HCC)    Past Surgical History:  Past Surgical History:  Procedure Laterality Date   BIOPSY  07/17/2018   Procedure: BIOPSY;  Surgeon: West Bali, MD;  [facility]: [facility];  Service: Endoscopy;;  colon   BOWEL RESECTION  09/17/2018   SMALL BOWEL RESECTION: 71 CM    CARDIAC CATHETERIZATION N/A 07/21/2015   Procedure: Left Heart Cath and Coronary Angiography;  Surgeon: Peter M Swaziland, MD;  Location: [facility] INVASIVE CV LAB;  Service: Cardiovascular;  Laterality: N/A;   CARDIAC CATHETERIZATION N/A 07/21/2015   Procedure: Coronary Stent Intervention;  Surgeon: Peter M Swaziland, MD;  Location: [facility] INVASIVE CV LAB;  Service: Cardiovascular;  Laterality: N/A;   CIRCUMCISION N/A 04/05/2019   Procedure:  CIRCUMCISION ADULT;  Surgeon: Bjorn Pippin, MD;  Location: [facility] ORS;  Service: Urology;  Laterality: N/A;   COLONOSCOPY N/A 05/19/2014   Dr. Cyndi Bender diverticulosis/moderate external hemorrhoids, >20 simple adenomas. Genetic screening negative.    COLONOSCOPY WITH PROPOFOL N/A 07/17/2018   Dr. Darrick Penna: Diverticulosis, external/internal hemorrhoids, 32 colon polyps removed.  ten tubular adenomas removed with no high-grade dysplasia.  Advised to have surveillance colonoscopy in 3 years.   COLONOSCOPY WITH PROPOFOL N/A 06/07/2021   Procedure: COLONOSCOPY WITH PROPOFOL;  Surgeon: Lanelle Bal, DO;  Location: [facility];  Service: Endoscopy;  Laterality: N/A;  9:30 / ASA 3  (Pt was told that his time will  be given at Pre-op)   CORONARY PRESSURE/FFR STUDY Left 06/08/2017   Procedure: INTRAVASCULAR PRESSURE WIRE/FFR STUDY;  Surgeon: Yvonne Kendall, MD;  Location: [facility] INVASIVE CV LAB;  Service: Cardiovascular;  Laterality: Left;  LAD and CFX   ESOPHAGOGASTRODUODENOSCOPY (EGD) WITH PROPOFOL N/A 07/17/2018   Dr. Darrick Penna: Low-grade narrowing Schatzki ring at the GE junction status post dilation.  Gastritis.  Biopsy with mild nonspecific reactive gastropathy.  No H. pylori.   GIVENS CAPSULE STUDY N/A 06/24/2019   normal   HERNIA REPAIR  1986   Left inguinal   INCISIONAL HERNIA REPAIR N/A 10/19/2022   Procedure: OPEN INCISIONAL HERNIA REPAIR;  Surgeon: Fritzi Mandes, MD;  Location[facility];  Service: General;  Laterality: N/A;   INSERTION OF MESH N/A 10/19/2022   Procedure: INSERTION OF MESH;  Surgeon: Fritzi Mandes, MD;  Location: [facility] OR;  Service: General;  Laterality: N/A;   LAPAROTOMY N/A 09/17/2018   Procedure: EXPLORATORY LAPAROTOMY;  Surgeon: Lucretia Roers, MD;  Location: [facility] ORS;  Service: General;  Laterality: N/A;   LEFT HEART CATH AND CORONARY ANGIOGRAPHY N/A 06/08/2017   Procedure: LEFT HEART CATH AND CORONARY ANGIOGRAPHY;  Surgeon: Yvonne Kendall, MD;  Location: [facility] INVASIVE CV LAB;  Service: Cardiovascular;  Laterality: N/A;   POLYPECTOMY  07/17/2018   Procedure: POLYPECTOMY;  Surgeon: West Bali, MD;  [facility];  Service: Endoscopy;;  colon   POLYPECTOMY  06/07/2021   Procedure: POLYPECTOMY INTESTINAL;  Surgeon: Lanelle Bal, DO;  [facility];  Service: Endoscopy;;   ROTATOR CUFF REPAIR     Right   SAVORY DILATION N/A 07/17/2018   Procedure: SAVORY DILATION;  Surgeon: West Bali, MD;  Location: [facility];  Service: Endoscopy;  Laterality: N/A;    Social History:  reports that he has been smoking cigarettes. He started smoking about 52 years ago. He has a 19.50 pack-year smoking history. He has been exposed to tobacco smoke. He has never  used smokeless tobacco. He reports that he does not drink alcohol and does not use drugs. Family History: family history includes Brain cancer in his maternal uncle; Breast cancer in his cousin; Breast cancer (age of onset: 70) in his mother; Cancer in his cousin and maternal uncle; Diabetes in his brother; Heart attack in his father; Hypertension in his mother; Parkinson's disease in his brother; Skin cancer (age of onset: 23) in his sister.   HOME MEDICATIONS: Allergies as of 01/16/2023   No Known Allergies      Medication List        Accurate as of Jan 16, 2023 12:28 PM. If you have any questions, ask your nurse or doctor.          albuterol 108 (90 Base) MCG/ACT inhaler Commonly known as: Ventolin HFA INHALE 2 PUFFS  INTO THE LUNGS EVERY 6 HOURS AS NEEDED FOR WHEEZING OR SHORTNESS OF BREATH   amoxicillin 875 MG tablet Commonly known as: AMOXIL Take 1 tablet (875 mg total) by mouth 2 (two) times daily for 10 days.   atorvastatin 80 MG tablet Commonly known as: LIPITOR Take 1 tablet (80 mg total) by mouth once daily in the evening.  Please follow up in 2-3 months. (Take 1 tablet (80 mg total) by mouth daily. TAKE 1 TABLET(80 MG) BY MOUTH EVERY EVENING)   Azelastine HCl 137 MCG/SPRAY Soln Place 1 spray into both nostrils 2 (two) times daily. Use in each nostril as directed What changed:  when to take this reasons to take this   Breztri Aerosphere 160-9-4.8 MCG/ACT Aero Generic drug: Budeson-Glycopyrrol-Formoterol Inhale 2 puffs into the lungs 2 (two) times daily.   buPROPion 150 MG 24 hr tablet Commonly known as: Wellbutrin XL Take 3 tablets by mouth once daily. (Take 3 tablets once daily) What changed:  how much to take how to take this when to take this additional instructions   docusate sodium 100 MG capsule Commonly known as: COLACE Take 1 capsule (100 mg total) by mouth daily as needed for mild constipation. What changed: when to take this   Eliquis 5 MG  Tabs tablet Generic drug: apixaban TAKE 1 TABLET (5 MG) BY MOUTH TWICE DAILY   ezetimibe 10 MG tablet Commonly known as: ZETIA Take 1 tablet (10 mg total) by mouth daily.   ferrous sulfate 325 (65 FE) MG tablet Take 325 mg by mouth daily with breakfast.   furosemide 40 MG tablet Commonly known as: LASIX Take 1 tablet (40 mg total) by mouth daily.   HYDROcodone-acetaminophen 5-325 MG tablet Commonly known as: NORCO/VICODIN Take 1-2 tablets by mouth 2 (two) times daily as needed for moderate pain or severe pain.   hydrOXYzine 25 MG tablet Commonly known as: ATARAX Take 1 tablet (25 mg total) by mouth at bedtime and may repeat dose one time if needed. For sleep.   magnesium 30 MG tablet Take 30 mg by mouth in the morning.   metFORMIN 500 MG tablet Commonly known as: GLUCOPHAGE Take 1 tablet (500mg ) by mouth twice daily with a meal. (Take 1 tablet (500 mg total) by mouth 2 (two) times daily before a meal.)   Metoprolol Tartrate 75 MG Tabs Take 1 tablet by mouth twice daily. (Take 75 mg by mouth 2 (two) times daily.)   nitroGLYCERIN 0.4 MG SL tablet Commonly known as: NITROSTAT Place 1 tablet (0.4 mg total) under the tongue every 5 (five) minutes as needed.   omeprazole 40 MG capsule Commonly known as: PRILOSEC Take 1 capsule by mouth once daily 30 minutes before breakfast   polyethylene glycol 17 g packet Commonly known as: MIRALAX / GLYCOLAX Take 17 g by mouth daily as needed for mild constipation.   potassium chloride SA 20 MEQ tablet Commonly known as: KLOR-CON M Take 1 tablet (20 mEq total) by mouth daily.   rOPINIRole 3 MG tablet Commonly known as: REQUIP Take 1 tablet by mouth at night for restless leg syndrome, and 0.5 tablet during the day for breakthrough symptoms. (Take 1 tab nightly for RLS and a 1/2 tab during the day to control breakthrough symptoms)   sildenafil 100 MG tablet Commonly known as: VIAGRA Take 1 tablet (100 mg total) by mouth 30 minutes  before activity   sucralfate 1 g tablet Commonly known as: CARAFATE Take one tablet po BID PRN   tiZANidine  4 MG tablet Commonly known as: Zanaflex Take 1 tablet (4 mg total) by mouth 2 (two) times daily as needed for muscle spasms.   traZODone 150 MG tablet Commonly known as: DESYREL Take 1 tablet (150 mg total) by mouth at bedtime.          REVIEW OF SYSTEMS: A comprehensive ROS was conducted with the patient and is negative except as per HPI    OBJECTIVE:  VS: BP 120/80 (BP Location: Left Arm, Patient Position: Sitting, Cuff Size: Large)   Pulse (!) 110   Ht 5\' 8"  (1.727 m)   Wt 218 lb (98.9 kg)   SpO2 98%   BMI 33.15 kg/m    Wt Readings from Last 3 Encounters:  01/16/23 218 lb (98.9 kg)  01/12/23 217 lb (98.4 kg)  12/27/22 220 lb (99.8 kg)     EXAM: General: Pt appears well and is in NAD  Eyes: External eye exam normal without stare, lid lag or exophthalmos.  EOM intact.    Neck: General: Supple without adenopathy. Thyroid: Thyroid size normal.  No goiter or nodules appreciated. No thyroid bruit.  Lungs: Clear with good BS bilat   Heart: Auscultation: RRR.  Abdomen: Soft, nontender  Extremities:  BL LE: No pretibial edema   Mental Status: Judgment, insight: Intact Orientation: Oriented to time, place, and person Mood and affect: No depression, anxiety, or agitation     DATA REVIEWED: ***    Latest Reference Range & Units 01/12/23 12:38  Sodium 134 - 144 mmol/L 140  Potassium 3.5 - 5.2 mmol/L 4.4  Chloride 96 - 106 mmol/L 103  CO2 20 - 29 mmol/L 23  Glucose 70 - 99 mg/dL 161 (H)  BUN 8 - 27 mg/dL 14  Creatinine 0.96 - 0.45 mg/dL 4.09  Calcium 8.6 - 81.1 mg/dL 8.9  BUN/Creatinine Ratio 10 - 24  15  eGFR >59 mL/min/1.73 92    Latest Reference Range & Units 01/12/23 12:38  WBC 3.4 - 10.8 x10E3/uL 5.8  RBC 4.14 - 5.80 x10E6/uL 4.52  Hemoglobin 13.0 - 17.7 g/dL 91.4 (L)  HCT 78.2 - 95.6 % 37.4 (L)  MCV 79 - 97 fL 83  MCH 26.6 - 33.0 pg 27.7   MCHC 31.5 - 35.7 g/dL 21.3  RDW 08.6 - 57.8 % 14.3  Platelets 150 - 450 x10E3/uL 154  Neutrophils Not Estab. % 77  Immature Granulocytes Not Estab. % 0  NEUT# 1.4 - 7.0 x10E3/uL 4.5  Lymphocyte # 0.7 - 3.1 x10E3/uL 0.8  Monocytes Absolute 0.1 - 0.9 x10E3/uL 0.4  Basophils Absolute 0.0 - 0.2 x10E3/uL 0.0  Immature Grans (Abs) 0.0 - 0.1 x10E3/uL 0.0  Lymphs Not Estab. % 15  Monocytes Not Estab. % 7  Basos Not Estab. % 0  Eos Not Estab. % 1  EOS (ABSOLUTE) 0.0 - 0.4 x10E3/uL 0.0  (L): Data is abnormally low   ASSESSMENT/PLAN/RECOMMENDATIONS:   Subclinical Hyperthyroidism:  -Patient with nonspecific symptoms -No local neck symptoms -Discussed differential diagnosis to include subacute thyroiditis, Graves' disease, autonomous thyroid nodule -We discussed the importance of euthyroidism in the setting of cardiac arrhythmia  Medications :  Signed electronically by: Lyndle Herrlich, MD  [facility] Endocrinology  [facility] [address] [place], [place] [address] Phone: [phone] FAX: [phone]   CC: Jac Canavan, PA-C [address] [place] [address] Phone: [phone] Fax: [phone]   Return to Endocrinology clinic as below: Future Appointments  Date Time Provider Department Center  02/09/2023  11:00 AM [facility] ANNUAL WELLNESS VISIT [facility] PEC  02/14/2023 11:15 AM Early, Sung Amabile, NP [facility]  03/22/2023  9:40 AM [name] Madolyn Frieze, MD [facility] None  06/29/2023  9:30 AM Kirsteins, Victorino Sparrow, MD [facility] CPR  07/19/2023  9:30 AM Sebastion Jun, Konrad Dolores, MD [facility] None  09/01/2023 10:00 AM Tiffany Kocher, PA-C [facility] RGA

## 2023-01-17 NOTE — Progress Notes (Signed)
I received the chest x-ray result from [facility].  Fortunately chest x-ray did not show anything worrisome in the chest including pneumonia  How are you doing today compared to your recent visit?

## 2023-01-18 LAB — T3, FREE: T3, Free: 3.9 pg/mL (ref 2.0–4.4)

## 2023-01-18 LAB — TSH: TSH: 0.35 u[IU]/mL — ABNORMAL LOW (ref 0.450–4.500)

## 2023-01-18 LAB — THYROTROPIN RECEPTOR AUTOABS: Thyrotropin Receptor Ab: <1.1 IU/L (ref 0.00–1.75)

## 2023-01-18 LAB — T4, FREE: Free T4: 1.68 ng/dL (ref 0.82–1.77)

## 2023-01-20 ENCOUNTER — Other Ambulatory Visit (HOSPITAL_BASED_OUTPATIENT_CLINIC_OR_DEPARTMENT_OTHER): Payer: Self-pay

## 2023-01-20 ENCOUNTER — Telehealth: Payer: Self-pay | Admitting: Internal Medicine

## 2023-01-20 DIAGNOSIS — E0590 Thyrotoxicosis, unspecified without thyrotoxic crisis or storm: Secondary | ICD-10-CM

## 2023-01-20 MED ORDER — METHIMAZOLE 5 MG PO TABS
5.0000 mg | ORAL_TABLET | ORAL | 1 refills | Status: DC
  Filled 2023-01-20: qty 52, 90d supply, fill #0
  Filled 2023-04-13: qty 52, 90d supply, fill #1

## 2023-01-20 NOTE — Telephone Encounter (Signed)
Spoke to the patient over the phone on 01/20/2023 regarding his low TSH, negative TRAb   I have recommended a small dose of methimazole 5 mg, to be taken 4 days out of the week  Patient will return for repeat labs on Monday 7/29 at 10:30 AM   Patient expressed understanding

## 2023-01-24 ENCOUNTER — Other Ambulatory Visit: Payer: Self-pay

## 2023-01-24 ENCOUNTER — Other Ambulatory Visit: Payer: Self-pay | Admitting: Gastroenterology

## 2023-01-24 ENCOUNTER — Other Ambulatory Visit (HOSPITAL_BASED_OUTPATIENT_CLINIC_OR_DEPARTMENT_OTHER): Payer: Self-pay

## 2023-01-24 MED ORDER — OMEPRAZOLE 40 MG PO CPDR
DELAYED_RELEASE_CAPSULE | ORAL | 3 refills | Status: DC
  Filled 2023-01-24: qty 90, 90d supply, fill #0
  Filled 2023-04-24: qty 90, 90d supply, fill #1
  Filled 2023-07-22: qty 90, 90d supply, fill #2

## 2023-02-04 ENCOUNTER — Other Ambulatory Visit: Payer: Self-pay | Admitting: Nurse Practitioner

## 2023-02-04 DIAGNOSIS — M2550 Pain in unspecified joint: Secondary | ICD-10-CM

## 2023-02-04 DIAGNOSIS — N471 Phimosis: Secondary | ICD-10-CM

## 2023-02-06 ENCOUNTER — Other Ambulatory Visit: Payer: Self-pay | Admitting: Nurse Practitioner

## 2023-02-06 ENCOUNTER — Other Ambulatory Visit (HOSPITAL_BASED_OUTPATIENT_CLINIC_OR_DEPARTMENT_OTHER): Payer: Self-pay

## 2023-02-06 ENCOUNTER — Other Ambulatory Visit: Payer: Self-pay

## 2023-02-06 DIAGNOSIS — G2581 Restless legs syndrome: Secondary | ICD-10-CM

## 2023-02-06 MED ORDER — TIZANIDINE HCL 4 MG PO TABS
4.0000 mg | ORAL_TABLET | Freq: Two times a day (BID) | ORAL | 0 refills | Status: DC | PRN
Start: 2023-02-06 — End: 2023-03-09
  Filled 2023-02-06: qty 60, 30d supply, fill #0

## 2023-02-06 MED ORDER — SILDENAFIL CITRATE 100 MG PO TABS
100.0000 mg | ORAL_TABLET | ORAL | 1 refills | Status: DC
Start: 2023-02-06 — End: 2023-04-05
  Filled 2023-02-06: qty 8, 30d supply, fill #0
  Filled 2023-03-08: qty 8, 30d supply, fill #1

## 2023-02-06 MED ORDER — ROPINIROLE HCL 3 MG PO TABS
ORAL_TABLET | ORAL | 1 refills | Status: DC
Start: 2023-02-06 — End: 2023-04-05
  Filled 2023-02-06: qty 45, 30d supply, fill #0
  Filled 2023-03-07: qty 45, 30d supply, fill #1

## 2023-02-06 NOTE — Telephone Encounter (Signed)
Refill request last apt 11/07/22. 

## 2023-02-09 ENCOUNTER — Encounter: Payer: Medicare Other | Admitting: Nurse Practitioner

## 2023-02-10 ENCOUNTER — Other Ambulatory Visit (HOSPITAL_BASED_OUTPATIENT_CLINIC_OR_DEPARTMENT_OTHER): Payer: Self-pay | Admitting: Family

## 2023-02-10 DIAGNOSIS — E785 Hyperlipidemia, unspecified: Secondary | ICD-10-CM

## 2023-02-10 DIAGNOSIS — Z01810 Encounter for preprocedural cardiovascular examination: Secondary | ICD-10-CM

## 2023-02-10 DIAGNOSIS — I5032 Chronic diastolic (congestive) heart failure: Secondary | ICD-10-CM

## 2023-02-14 ENCOUNTER — Ambulatory Visit (INDEPENDENT_AMBULATORY_CARE_PROVIDER_SITE_OTHER): Payer: Medicare Other | Admitting: Nurse Practitioner

## 2023-02-14 ENCOUNTER — Other Ambulatory Visit (HOSPITAL_BASED_OUTPATIENT_CLINIC_OR_DEPARTMENT_OTHER): Payer: Self-pay

## 2023-02-14 ENCOUNTER — Encounter: Payer: Self-pay | Admitting: Nurse Practitioner

## 2023-02-14 VITALS — BP 130/80 | HR 76 | Ht 68.0 in | Wt 220.6 lb

## 2023-02-14 DIAGNOSIS — I25118 Atherosclerotic heart disease of native coronary artery with other forms of angina pectoris: Secondary | ICD-10-CM | POA: Diagnosis not present

## 2023-02-14 DIAGNOSIS — I129 Hypertensive chronic kidney disease with stage 1 through stage 4 chronic kidney disease, or unspecified chronic kidney disease: Secondary | ICD-10-CM

## 2023-02-14 DIAGNOSIS — I5032 Chronic diastolic (congestive) heart failure: Secondary | ICD-10-CM

## 2023-02-14 DIAGNOSIS — I482 Chronic atrial fibrillation, unspecified: Secondary | ICD-10-CM | POA: Diagnosis not present

## 2023-02-14 DIAGNOSIS — E1122 Type 2 diabetes mellitus with diabetic chronic kidney disease: Secondary | ICD-10-CM | POA: Diagnosis not present

## 2023-02-14 DIAGNOSIS — J439 Emphysema, unspecified: Secondary | ICD-10-CM

## 2023-02-14 DIAGNOSIS — D6859 Other primary thrombophilia: Secondary | ICD-10-CM

## 2023-02-14 DIAGNOSIS — E1165 Type 2 diabetes mellitus with hyperglycemia: Secondary | ICD-10-CM | POA: Diagnosis not present

## 2023-02-14 DIAGNOSIS — F321 Major depressive disorder, single episode, moderate: Secondary | ICD-10-CM

## 2023-02-14 LAB — CBC WITH DIFFERENTIAL/PLATELET
Basophils Absolute: 0 10*3/uL (ref 0.0–0.2)
Basos: 1 %
Immature Granulocytes: 0 %
Lymphocytes Absolute: 1.9 10*3/uL (ref 0.7–3.1)
MCHC: 32.3 g/dL (ref 31.5–35.7)
MCV: 84 fL (ref 79–97)
Monocytes Absolute: 0.5 10*3/uL (ref 0.1–0.9)
RBC: 4.78 x10E6/uL (ref 4.14–5.80)
RDW: 14.3 % (ref 11.6–15.4)

## 2023-02-14 LAB — HEMOGLOBIN A1C

## 2023-02-14 MED ORDER — FUROSEMIDE 40 MG PO TABS
ORAL_TABLET | ORAL | 2 refills | Status: DC
Start: 2023-02-14 — End: 2023-11-13
  Filled 2023-02-14: qty 120, 90d supply, fill #0
  Filled 2023-05-15: qty 120, 90d supply, fill #1
  Filled 2023-08-14: qty 120, 90d supply, fill #2

## 2023-02-14 NOTE — Progress Notes (Signed)
Shaun Clamp, DNP, AGNP-c [facility]  [address] [place], [place] [address] [phone]  ESTABLISHED PATIENT- Chronic Health and/or Follow-Up Visit  Blood pressure 130/80, pulse 76, height 5\' 8"  (1.727 m), weight 220 lb 9.6 oz (100.1 kg), SpO2 97%.    Shaun [name] is a 67 y.o. year old male presenting today for evaluation and management of chronic conditions.   Shaun [name] reports the following:  Sleep Improved sleep.  Blood Sugar Elevated blood sugar levels lately, which he attributes to his own actions.  Bleeding Denies any signs of bleeding.    Mood Mood has improved but is not yet where he desires it to be. Has not tried any other medications besides Wellbutrin for mood management. Unsure of the cause of his mood issues and mentions having to control his temper at times, but he is getting better at recognizing and managing it.  Shortness of Breath Experiences occasional shortness of breath, which resolves after a few minutes of rest. Reports that his feet have swollen a couple of times, which he attributes to his diet, and this is also when he experiences shortness of breath.  Medication Currently on daily Lasix for fluid management.  All ROS negative with exception of what is listed above.   PHYSICAL EXAM Physical Exam Vitals and nursing note reviewed.  Constitutional:      Appearance: He is obese.  HENT:     Head: Normocephalic.  Eyes:     Conjunctiva/sclera: Conjunctivae normal.  Cardiovascular:     Rate and Rhythm: Normal rate. Rhythm irregular.     Pulses: Normal pulses.     Heart sounds: Normal heart sounds.  Pulmonary:     Effort: Pulmonary effort is normal.     Breath sounds: Wheezing present.  Abdominal:     Palpations: There is no mass.  Musculoskeletal:     Right lower leg: No edema.     Left lower leg: No edema.  Lymphadenopathy:     Cervical: No cervical adenopathy.  Skin:    General: Skin is warm and dry.      Capillary Refill: Capillary refill takes less than 2 seconds.  Neurological:     General: No focal deficit present.     Mental Status: He is alert.     Motor: Weakness present.  Psychiatric:        Mood and Affect: Mood normal.     PLAN Problem List Items Addressed This Visit     Chronic atrial fibrillation (HCC)    Atrial fibrillation present with rate control. He is on chronic anticoagulant and appears stable.  Plan: - continue current regimen - notify if symptoms occur.       Relevant Orders   Hemoglobin A1c (Completed)   CBC with Differential/Platelet (Completed)   Iron, TIBC and Ferritin Panel (Completed)   Hypertension associated with chronic kidney disease due to type 2 diabetes mellitus (HCC)    Chronic hypertension well controlled at this time. Goal BP <130/80.  Currently managed with metoprolol. Plan: - continue current medications - monitor blood pressure at least once a week and report readings consistently greater than 140/85      Relevant Orders   Hemoglobin A1c (Completed)   CBC with Differential/Platelet (Completed)   Iron, TIBC and Ferritin Panel (Completed)   Type 2 diabetes mellitus with hyperglycemia, without long-term current use of insulin (HCC) - Primary    Shaun [name] reports blood sugars running high lately. He admits that he has not been doing what he  needs to do to ensure that this stays under better control, but is eager to work on this.  Plan: - Check A1C and adjust diabetes management as needed. - Encourage regular blood sugar monitoring and healthy diet.      Relevant Orders   Hemoglobin A1c (Completed)   CBC with Differential/Platelet (Completed)   Iron, TIBC and Ferritin Panel (Completed)   COPD (chronic obstructive pulmonary disease) (HCC)    Shaun [name] reports occasional shortness of breath, resolving with rest.He denies any new exacerbations or concerns at this time. No cardiac alarm symptoms present at this time.  Plan: - Continue monitoring and  report any worsening or persistent symptoms.      Relevant Orders   Hemoglobin A1c (Completed)   CBC with Differential/Platelet (Completed)   Iron, TIBC and Ferritin Panel (Completed)   Chronic diastolic CHF (congestive heart failure) (HCC)    Shaun [name] reports occasional swelling in feet, correlating with dietary choices. Occasional shortness of breath, however, none evaluated today.  Plan: - Continue daily Lasix as prescribed. - Modify prescription to allow extra 1/2 tablet as needed for increased swelling/shortness of breath. - Encourage low-sodium diet and monitor for worsening edema.      Relevant Orders   Hemoglobin A1c (Completed)   CBC with Differential/Platelet (Completed)   Iron, TIBC and Ferritin Panel (Completed)   Current moderate episode of major depressive disorder without prior episode [facility])    Shaun [name] reports some mood improvement but not at desired level. He acknowledges temper concerns, but recognizes these triggers and is working to help reduce his frustration. He feels that he is doing much better at this.  Plan: - Continue Wellbutrin and monitor for changes in mood or behavior. - Consider discussing additional medication options if no further improvement. - Encourage continued self-awareness and stress management strategies. - Monitor for any worsening or changes in behavior.      Relevant Orders   Hemoglobin A1c (Completed)   CBC with Differential/Platelet (Completed)   Iron, TIBC and Ferritin Panel (Completed)   Coronary artery disease of native artery of native heart with stable angina pectoris (HCC)   Relevant Orders   Hemoglobin A1c (Completed)   CBC with Differential/Platelet (Completed)   Iron, TIBC and Ferritin Panel (Completed)   Hypercoagulable state (HCC)   Relevant Orders   Hemoglobin A1c (Completed)   CBC with Differential/Platelet (Completed)   Iron, TIBC and Ferritin Panel (Completed)    No follow-ups on file.  Time: 34 minutes, >50% spent  counseling, care coordination, chart review, and documentation.   Shaun Clamp, DNP, AGNP-c

## 2023-02-15 ENCOUNTER — Other Ambulatory Visit (HOSPITAL_BASED_OUTPATIENT_CLINIC_OR_DEPARTMENT_OTHER): Payer: Self-pay | Admitting: Cardiology

## 2023-02-15 ENCOUNTER — Other Ambulatory Visit (HOSPITAL_BASED_OUTPATIENT_CLINIC_OR_DEPARTMENT_OTHER): Payer: Self-pay

## 2023-02-15 DIAGNOSIS — I482 Chronic atrial fibrillation, unspecified: Secondary | ICD-10-CM

## 2023-02-15 LAB — IRON,TIBC AND FERRITIN PANEL
Ferritin: 25 ng/mL — ABNORMAL LOW (ref 30–400)
Iron Saturation: 16 % (ref 15–55)
Iron: 62 ug/dL (ref 38–169)
Total Iron Binding Capacity: 396 ug/dL (ref 250–450)
UIBC: 334 ug/dL (ref 111–343)

## 2023-02-15 LAB — CBC WITH DIFFERENTIAL/PLATELET
EOS (ABSOLUTE): 0.1 10*3/uL (ref 0.0–0.4)
Eos: 2 %
Hematocrit: 40 % (ref 37.5–51.0)
Hemoglobin: 12.9 g/dL — ABNORMAL LOW (ref 13.0–17.7)
Immature Grans (Abs): 0 10*3/uL (ref 0.0–0.1)
Lymphs: 30 %
MCH: 27 pg (ref 26.6–33.0)
Monocytes: 7 %
Neutrophils Absolute: 3.7 10*3/uL (ref 1.4–7.0)
Neutrophils: 60 %
Platelets: 180 10*3/uL (ref 150–450)
WBC: 6.2 10*3/uL (ref 3.4–10.8)

## 2023-02-15 LAB — HEMOGLOBIN A1C: Est. average glucose Bld gHb Est-mCnc: 148 mg/dL

## 2023-02-15 MED ORDER — APIXABAN 5 MG PO TABS
5.0000 mg | ORAL_TABLET | Freq: Two times a day (BID) | ORAL | 1 refills | Status: DC
Start: 2023-02-15 — End: 2023-10-20
  Filled 2023-02-23: qty 180, 90d supply, fill #0
  Filled 2023-06-20: qty 60, 30d supply, fill #1
  Filled 2023-08-04: qty 60, 30d supply, fill #2
  Filled 2023-09-25: qty 60, 30d supply, fill #3

## 2023-02-15 NOTE — Telephone Encounter (Signed)
Prescription refill request for Eliquis received. Indication: AF Last office visit: 09/20/22  E Monge NP Scr: 0.92 on 01/12/23  Epic Age: 67 Weight: 98.6kg  Based on above findings Eliquis 5mg  twice daily is the appropriate dose.  Refill approved.

## 2023-02-23 ENCOUNTER — Other Ambulatory Visit (HOSPITAL_BASED_OUTPATIENT_CLINIC_OR_DEPARTMENT_OTHER): Payer: Self-pay

## 2023-02-23 ENCOUNTER — Other Ambulatory Visit: Payer: Self-pay | Admitting: Nurse Practitioner

## 2023-02-23 ENCOUNTER — Other Ambulatory Visit: Payer: Self-pay

## 2023-02-23 DIAGNOSIS — M2550 Pain in unspecified joint: Secondary | ICD-10-CM

## 2023-02-23 NOTE — Telephone Encounter (Signed)
Refill request last apt 02/10/23.

## 2023-02-24 ENCOUNTER — Other Ambulatory Visit (HOSPITAL_BASED_OUTPATIENT_CLINIC_OR_DEPARTMENT_OTHER): Payer: Self-pay

## 2023-02-24 MED ORDER — HYDROCODONE-ACETAMINOPHEN 5-325 MG PO TABS
1.0000 | ORAL_TABLET | Freq: Two times a day (BID) | ORAL | 0 refills | Status: DC | PRN
Start: 2023-02-24 — End: 2023-04-24
  Filled 2023-02-24 – 2023-02-25 (×2): qty 120, 30d supply, fill #0

## 2023-02-25 ENCOUNTER — Other Ambulatory Visit (HOSPITAL_BASED_OUTPATIENT_CLINIC_OR_DEPARTMENT_OTHER): Payer: Self-pay

## 2023-02-28 ENCOUNTER — Other Ambulatory Visit (HOSPITAL_BASED_OUTPATIENT_CLINIC_OR_DEPARTMENT_OTHER): Payer: Self-pay

## 2023-02-28 ENCOUNTER — Other Ambulatory Visit: Payer: Self-pay | Admitting: Nurse Practitioner

## 2023-02-28 DIAGNOSIS — E611 Iron deficiency: Secondary | ICD-10-CM

## 2023-02-28 MED ORDER — FERROUS SULFATE 325 (65 FE) MG PO TABS
325.0000 mg | ORAL_TABLET | ORAL | 1 refills | Status: DC
Start: 2023-03-01 — End: 2023-08-10
  Filled 2023-02-28: qty 30, 70d supply, fill #0
  Filled 2023-05-26: qty 30, 70d supply, fill #1

## 2023-03-08 ENCOUNTER — Other Ambulatory Visit (HOSPITAL_BASED_OUTPATIENT_CLINIC_OR_DEPARTMENT_OTHER): Payer: Self-pay

## 2023-03-09 ENCOUNTER — Other Ambulatory Visit: Payer: Self-pay | Admitting: Family Medicine

## 2023-03-09 DIAGNOSIS — M2550 Pain in unspecified joint: Secondary | ICD-10-CM

## 2023-03-10 ENCOUNTER — Other Ambulatory Visit (HOSPITAL_BASED_OUTPATIENT_CLINIC_OR_DEPARTMENT_OTHER): Payer: Self-pay

## 2023-03-10 MED ORDER — TIZANIDINE HCL 4 MG PO TABS
4.0000 mg | ORAL_TABLET | Freq: Two times a day (BID) | ORAL | 0 refills | Status: DC | PRN
Start: 2023-03-10 — End: 2023-04-10
  Filled 2023-03-10: qty 60, 30d supply, fill #0

## 2023-03-10 NOTE — Telephone Encounter (Signed)
Refill request last apt 02/14/23.

## 2023-03-13 NOTE — Progress Notes (Signed)
HPI: FU permanent atrial fibrillation, coronary artery disease, chronic diastolic congestive heart failure, diabetes mellitus and hypertension. Patient is status post PCI of his circumflex in November 2016. Patient was seen in atrial fibrillation clinic in June 2018 to see if there were options for restoring sinus rhythm. Given long-standing atrial fibrillation and left atrial enlargement it was felt that rate control and anticoagulation indicated. Cardiac catheterization October 2018 showed 30% distal circumflex.  LV function was normal with normal LV filling pressure. Monitor 8/20 showed atrial fibrillation with PVCs or aberrantly conducted beats, rate controlled. Abdominal CT July 2022 showed no aneurysm. Echocardiogram December 2022 showed normal LV function, moderate left ventricular hypertrophy, mild left atrial enlargement.  Since last seen, the patient has dyspnea with more extreme activities but not with routine activities. It is relieved with rest. It is not associated with chest pain. There is no orthopnea, PND or pedal edema. There is no syncope or palpitations. There is no exertional chest pain.   Current Outpatient Medications  Medication Sig Dispense Refill   albuterol (VENTOLIN HFA) 108 (90 Base) MCG/ACT inhaler INHALE 2 PUFFS INTO THE LUNGS EVERY 6 HOURS AS NEEDED FOR WHEEZING OR SHORTNESS OF BREATH 6.7 g 1   apixaban (ELIQUIS) 5 MG TABS tablet Take 1 tablet (5 mg total) by mouth 2 (two) times daily. 180 tablet 1   atorvastatin (LIPITOR) 80 MG tablet Take 1 tablet (80 mg total) by mouth every evening. 90 tablet 2   azelastine (ASTELIN) 0.1 % nasal spray Place 1 spray into both nostrils 2 (two) times daily. Use in each nostril as directed 30 mL 12   buPROPion (WELLBUTRIN XL) 150 MG 24 hr tablet Take 3 tablets once daily (Patient taking differently: Take 150 mg by mouth 3 (three) times daily.) 270 tablet 3   docusate sodium (COLACE) 100 MG capsule Take 1 capsule (100 mg total) by  mouth daily as needed for mild constipation. (Patient taking differently: Take 100 mg by mouth daily.) 100 capsule 3   ezetimibe (ZETIA) 10 MG tablet Take 1 tablet (10 mg total) by mouth daily. 90 tablet 3   ferrous sulfate 325 (65 FE) MG tablet Take 1 tablet (325 mg total) by mouth 3 (three) times a week. 30 tablet 1   furosemide (LASIX) 40 MG tablet Take 1 tablet (40mg ) daily. May take 1/2 tablet (20mg ) additional if shortness of breath or swelling occurs. 120 tablet 2   HYDROcodone-acetaminophen (NORCO/VICODIN) 5-325 MG tablet Take 1-2 tablets by mouth 2 (two) times daily as needed for moderate pain or severe pain. 120 tablet 0   hydrOXYzine (ATARAX) 25 MG tablet Take 1 tablet (25 mg total) by mouth at bedtime and may repeat dose one time if needed. For sleep. 60 tablet 2   metFORMIN (GLUCOPHAGE) 500 MG tablet Take 1 tablet (500 mg total) by mouth 2 (two) times daily before a meal. 180 tablet 3   methimazole (TAPAZOLE) 5 MG tablet Take 1 tablet (5 mg total) by mouth 4 days a week. 52 tablet 1   Metoprolol Tartrate 75 MG TABS Take 75 mg by mouth 2 (two) times daily. 120 tablet 3   nitroGLYCERIN (NITROSTAT) 0.4 MG SL tablet Place 1 tablet (0.4 mg total) under the tongue every 5 (five) minutes as needed. 25 tablet 3   omeprazole (PRILOSEC) 40 MG capsule Take 1 capsule by mouth once daily 30 minutes before breakfast 90 capsule 3   polyethylene glycol (MIRALAX / GLYCOLAX) 17 g packet Take 17 g  by mouth daily as needed for mild constipation. 14 each 0   potassium chloride SA (KLOR-CON M) 20 MEQ tablet Take 1 tablet (20 mEq total) by mouth daily. 90 tablet 3   rOPINIRole (REQUIP) 3 MG tablet Take 1 tablet by mouth nightly for restless leg, and a 1/2 tab during the day to control breakthrough symptoms 45 tablet 1   sildenafil (VIAGRA) 100 MG tablet Take 1 tablet (100 mg total) by mouth 30 minutes before activity 8 tablet 1   sucralfate (CARAFATE) 1 g tablet Take one tablet po BID PRN 42 tablet 5    tiZANidine (ZANAFLEX) 4 MG tablet Take 1 tablet (4 mg total) by mouth 2 (two) times daily as needed for muscle spasms. 60 tablet 0   traZODone (DESYREL) 150 MG tablet Take 1 tablet (150 mg total) by mouth at bedtime. 90 tablet 1   Current Facility-Administered Medications  Medication Dose Route Frequency Provider Last Rate Last Admin   lidocaine (XYLOCAINE) 1 % (with pres) injection 5 mL  5 mL Other Once Kirsteins, Victorino Sparrow, MD         Past Medical History:  Diagnosis Date   Anemia    Anginal pain (HCC)    Anxiety    Asthma    Bacterial URI 08/29/2022   CAD (coronary artery disease)    DES to distal circumflex 2016   Cataract    CHF (congestive heart failure) (HCC)    Colon polyps    30 colon polyps found on first colonoscopy   Depression    Diastolic heart failure (HCC)    Diverticulitis    DJD (degenerative joint disease)    Dyspnea    Dysrhythmia    A-Fib   Genetic testing 09/09/2014   Negative genetic testing on the ColoNext panel test and the MSH2 inversion testing.  The ColoNext gene panel offered by [facility] and includes sequencing and rearrangement analysis for the following 17 genes: APC, BMPR1A, CDH1, CHEK2, EPCAM, GREM1, MLH1, MSH2, MSH6, MUTYH, PMS2, POLD1, POLE, PTEN, SMAD4, STK11, and TP53.   The report date is 09/08/14.      GERD (gastroesophageal reflux disease)    History of kidney stones    Hyperlipidemia    Hypernatremia 08/22/2021   Hypertension    Insomnia    Obstructive sleep apnea 12/2009   01/26/2010 AHI 83/hr   Permanent atrial fibrillation (HCC)    Onset 2006 paroxysmal then progressive to persistent   Pneumonia    PUD (peptic ulcer disease)    1980s   RLS (restless legs syndrome)    Sinusitis    Skin cancer    Type 2 diabetes mellitus (HCC)     Past Surgical History:  Procedure Laterality Date   BIOPSY  07/17/2018   Procedure: BIOPSY;  Surgeon: West Bali, MD;  Location: [facility];  Service: Endoscopy;;  colon   BOWEL  RESECTION  09/17/2018   SMALL BOWEL RESECTION: 71 CM    CARDIAC CATHETERIZATION N/A 07/21/2015   Procedure: Left Heart Cath and Coronary Angiography;  Surgeon: Peter M Swaziland, MD;  Location: [facility] INVASIVE CV LAB;  Service: Cardiovascular;  Laterality: N/A;   CARDIAC CATHETERIZATION N/A 07/21/2015   Procedure: Coronary Stent Intervention;  Surgeon: Peter M Swaziland, MD;  Location: [facility] INVASIVE CV LAB;  Service: Cardiovascular;  Laterality: N/A;   CIRCUMCISION N/A 04/05/2019   Procedure: CIRCUMCISION ADULT;  Surgeon: Bjorn Pippin, MD;  Location: [facility];  Service: Urology;  Laterality: N/A;   COLONOSCOPY N/A 05/19/2014  Dr. Cyndi Bender diverticulosis/moderate external hemorrhoids, >20 simple adenomas. Genetic screening negative.    COLONOSCOPY WITH PROPOFOL N/A 07/17/2018   Dr. Darrick Penna: Diverticulosis, external/internal hemorrhoids, 32 colon polyps removed.  ten tubular adenomas removed with no high-grade dysplasia.  Advised to have surveillance colonoscopy in 3 years.   COLONOSCOPY WITH PROPOFOL N/A 06/07/2021   Procedure: COLONOSCOPY WITH PROPOFOL;  Surgeon: Lanelle Bal, DO;  Location: [facility];  Service: Endoscopy;  Laterality: N/A;  9:30 / ASA 3  (Pt was told that his time will be given at Pre-op)   CORONARY PRESSURE/FFR STUDY Left 06/08/2017   Procedure: INTRAVASCULAR PRESSURE WIRE/FFR STUDY;  Surgeon: Yvonne Kendall, MD;  Location: [facility] INVASIVE CV LAB;  Service: Cardiovascular;  Laterality: Left;  LAD and CFX   ESOPHAGOGASTRODUODENOSCOPY (EGD) WITH PROPOFOL N/A 07/17/2018   Dr. Darrick Penna: Low-grade narrowing Schatzki ring at the GE junction status post dilation.  Gastritis.  Biopsy with mild nonspecific reactive gastropathy.  No H. pylori.   GIVENS CAPSULE STUDY N/A 06/24/2019   normal   HERNIA REPAIR  1986   Left inguinal   INCISIONAL HERNIA REPAIR N/A 10/19/2022   Procedure: OPEN INCISIONAL HERNIA REPAIR;  Surgeon: Fritzi Mandes, MD;  Location: [facility] OR;  Service: General;  Laterality: N/A;    INSERTION OF MESH N/A 10/19/2022   Procedure: INSERTION OF MESH;  Surgeon: Fritzi Mandes, MD;  Location: [facility] OR;  Service: General;  Laterality: N/A;   LAPAROTOMY N/A 09/17/2018   Procedure: EXPLORATORY LAPAROTOMY;  Surgeon: Lucretia Roers, MD;  Location: [facility];  Service: General;  Laterality: N/A;   LEFT HEART CATH AND CORONARY ANGIOGRAPHY N/A 06/08/2017   Procedure: LEFT HEART CATH AND CORONARY ANGIOGRAPHY;  Surgeon: Yvonne Kendall, MD;  Location: [facility] INVASIVE CV LAB;  Service: Cardiovascular;  Laterality: N/A;   POLYPECTOMY  07/17/2018   Procedure: POLYPECTOMY;  Surgeon: West Bali, MD;  [facility];  Service: Endoscopy;;  colon   POLYPECTOMY  06/07/2021   Procedure: POLYPECTOMY INTESTINAL;  Surgeon: Lanelle Bal, DO;  Location: [facility];  Service: Endoscopy;;   ROTATOR CUFF REPAIR     Right   SAVORY DILATION N/A 07/17/2018   Procedure: SAVORY DILATION;  Surgeon: West Bali, MD;  Location: [facility];  Service: Endoscopy;  Laterality: N/A;    Social History   Socioeconomic History   Marital status: Married    Spouse name: [name] Sowder   Number of children: 5   Years of education: 12   Highest education level: 12th grade  Occupational History   Occupation: employed    Associate Professor: Programme researcher, broadcasting/film/video    Comment: full-time  Tobacco Use   Smoking status: Some Days    Current packs/day: 0.00    Average packs/day: 0.5 packs/day for 51.0 years (25.5 ttl pk-yrs)    Types: Cigarettes    Start date: 07/24/1970    Last attempt to quit: 08/06/2021    Years since quitting: 1.6    Passive exposure: Past   Smokeless tobacco: Never  Vaping Use   Vaping status: Never Used  Substance and Sexual Activity   Alcohol use: No    Alcohol/week: 0.0 standard drinks of alcohol   Drug use: No   Sexual activity: Yes    Partners: Female  Other Topics Concern   Not on file  Social History Narrative   Not on file   Social Determinants of Health   Financial  Resource Strain: Medium Risk (02/14/2023)   Overall Financial Resource Strain (CARDIA)  Difficulty of Paying Living Expenses: Somewhat hard  Food Insecurity: Food Insecurity Present (02/14/2023)   Hunger Vital Sign    Worried About Running Out of Food in the Last Year: Sometimes true    Ran Out of Food in the Last Year: Never true  Transportation Needs: No Transportation Needs (02/14/2023)   PRAPARE - Administrator, Civil Service (Medical): No    Lack of Transportation (Non-Medical): No  Physical Activity: Sufficiently Active (02/14/2023)   Exercise Vital Sign    Days of Exercise per Week: 5 days    Minutes of Exercise per Session: 30 min  Stress: Stress Concern Present (02/14/2023)   [employer] of Occupational Health - Occupational Stress Questionnaire    Feeling of Stress : Rather much  Social Connections: Socially Integrated (02/14/2023)   Social Connection and Isolation Panel [NHANES]    Frequency of Communication with Friends and Family: Twice a week    Frequency of Social Gatherings with Friends and Family: Once a week    Attends Religious Services: 1 to 4 times per year    Active Member of [employer] or Organizations: Yes    Attends Banker Meetings: 1 to 4 times per year    Marital Status: Married  Catering manager Violence: Not At Risk (02/14/2023)   Humiliation, Afraid, Rape, and Kick questionnaire    Fear of Current or Ex-Partner: No    Emotionally Abused: No    Physically Abused: No    Sexually Abused: No    Family History  Problem Relation Age of Onset   Hypertension Mother    Breast cancer Mother 19       brain/bone    Heart attack Father    Skin cancer Sister 79   Diabetes Brother    Parkinson's disease Brother    Brain cancer Maternal Uncle    Cancer Maternal Uncle        NOS   Breast cancer Cousin        maternal cousin dx <50   Cancer Cousin    Colon cancer Neg Hx     ROS: no fevers or chills, productive cough, hemoptysis,  dysphasia, odynophagia, melena, hematochezia, dysuria, hematuria, rash, seizure activity, orthopnea, PND, pedal edema, claudication. Remaining systems are negative.  Physical Exam: Well-developed well-nourished in no acute distress.  Skin is warm and dry.  HEENT is normal.  Neck is supple.  Chest is clear to auscultation with normal expansion.  Cardiovascular exam is irregular Abdominal exam nontender or distended. No masses palpated. Extremities show no edema. neuro grossly intact   A/P  1 permanent atrial fibrillation-will continue beta-blocker at present dose for rate control.  Continue apixaban.  2 chronic diastolic congestive heart failure-patient is euvolemic today on examination.  Continue present dose of diuretic.  3 coronary artery disease-patient denies chest pain.  Continue statin.  He is not on aspirin given need for apixaban.  4 hyperlipidemia-continue Lipitor and Zetia. Check lipids; recent LFTs normal.  5 obstructive sleep apnea-continue CPAP.  6 hypertension-patient's blood pressure is elevated; however he follows this at home and it is typically controlled.  Continue present medications and follow.  [name], MD

## 2023-03-14 ENCOUNTER — Other Ambulatory Visit (HOSPITAL_BASED_OUTPATIENT_CLINIC_OR_DEPARTMENT_OTHER): Payer: Self-pay

## 2023-03-14 ENCOUNTER — Other Ambulatory Visit (HOSPITAL_BASED_OUTPATIENT_CLINIC_OR_DEPARTMENT_OTHER): Payer: Self-pay | Admitting: Cardiology

## 2023-03-14 DIAGNOSIS — E785 Hyperlipidemia, unspecified: Secondary | ICD-10-CM

## 2023-03-14 MED ORDER — ATORVASTATIN CALCIUM 80 MG PO TABS
80.0000 mg | ORAL_TABLET | Freq: Every evening | ORAL | 2 refills | Status: DC
Start: 2023-03-14 — End: 2024-03-14
  Filled 2023-03-14: qty 90, 90d supply, fill #0
  Filled 2023-10-06: qty 90, 90d supply, fill #1

## 2023-03-22 ENCOUNTER — Encounter: Payer: Self-pay | Admitting: Cardiology

## 2023-03-22 ENCOUNTER — Ambulatory Visit: Payer: Medicare Other | Attending: Cardiology | Admitting: Cardiology

## 2023-03-22 VITALS — BP 138/98 | HR 77 | Ht 68.0 in | Wt 219.0 lb

## 2023-03-22 DIAGNOSIS — E782 Mixed hyperlipidemia: Secondary | ICD-10-CM

## 2023-03-22 DIAGNOSIS — I25118 Atherosclerotic heart disease of native coronary artery with other forms of angina pectoris: Secondary | ICD-10-CM

## 2023-03-22 DIAGNOSIS — I10 Essential (primary) hypertension: Secondary | ICD-10-CM | POA: Diagnosis not present

## 2023-03-22 DIAGNOSIS — I4821 Permanent atrial fibrillation: Secondary | ICD-10-CM | POA: Diagnosis not present

## 2023-03-22 DIAGNOSIS — I5032 Chronic diastolic (congestive) heart failure: Secondary | ICD-10-CM

## 2023-03-22 NOTE — Patient Instructions (Signed)

## 2023-03-23 NOTE — Assessment & Plan Note (Signed)
Chronic hypertension well controlled at this time. Goal BP <130/80.  Currently managed with metoprolol. Plan: - continue current medications - monitor blood pressure at least once a week and report readings consistently greater than 140/85

## 2023-03-23 NOTE — Assessment & Plan Note (Signed)
Shaun [name] reports blood sugars running high lately. He admits that he has not been doing what he needs to do to ensure that this stays under better control, but is eager to work on this.  Plan: - Check A1C and adjust diabetes management as needed. - Encourage regular blood sugar monitoring and healthy diet.

## 2023-03-23 NOTE — Assessment & Plan Note (Signed)
Atrial fibrillation present with rate control. He is on chronic anticoagulant and appears stable.  Plan: - continue current regimen - notify if symptoms occur.

## 2023-03-23 NOTE — Assessment & Plan Note (Signed)
Shaun [name] reports occasional shortness of breath, resolving with rest.He denies any new exacerbations or concerns at this time. No cardiac alarm symptoms present at this time.  Plan: - Continue monitoring and report any worsening or persistent symptoms.

## 2023-03-23 NOTE — Assessment & Plan Note (Signed)
Shaun [name] reports occasional swelling in feet, correlating with dietary choices. Occasional shortness of breath, however, none evaluated today.  Plan: - Continue daily Lasix as prescribed. - Modify prescription to allow extra 1/2 tablet as needed for increased swelling/shortness of breath. - Encourage low-sodium diet and monitor for worsening edema.

## 2023-03-23 NOTE — Assessment & Plan Note (Signed)
Shaun [name] reports some mood improvement but not at desired level. He acknowledges temper concerns, but recognizes these triggers and is working to help reduce his frustration. He feels that he is doing much better at this.  Plan: - Continue Wellbutrin and monitor for changes in mood or behavior. - Consider discussing additional medication options if no further improvement. - Encourage continued self-awareness and stress management strategies. - Monitor for any worsening or changes in behavior.

## 2023-03-27 ENCOUNTER — Other Ambulatory Visit: Payer: Medicare Other

## 2023-03-31 ENCOUNTER — Other Ambulatory Visit (INDEPENDENT_AMBULATORY_CARE_PROVIDER_SITE_OTHER): Payer: Medicare Other

## 2023-03-31 DIAGNOSIS — E0590 Thyrotoxicosis, unspecified without thyrotoxic crisis or storm: Secondary | ICD-10-CM | POA: Diagnosis not present

## 2023-03-31 LAB — TSH: TSH: 0.84 u[IU]/mL (ref 0.35–5.50)

## 2023-03-31 LAB — T4, FREE: Free T4: 0.93 ng/dL (ref 0.60–1.60)

## 2023-04-01 ENCOUNTER — Other Ambulatory Visit (HOSPITAL_BASED_OUTPATIENT_CLINIC_OR_DEPARTMENT_OTHER): Payer: Self-pay

## 2023-04-05 ENCOUNTER — Other Ambulatory Visit: Payer: Self-pay

## 2023-04-05 ENCOUNTER — Other Ambulatory Visit (HOSPITAL_BASED_OUTPATIENT_CLINIC_OR_DEPARTMENT_OTHER): Payer: Self-pay

## 2023-04-05 ENCOUNTER — Other Ambulatory Visit: Payer: Self-pay | Admitting: Nurse Practitioner

## 2023-04-05 DIAGNOSIS — N471 Phimosis: Secondary | ICD-10-CM

## 2023-04-05 DIAGNOSIS — I10 Essential (primary) hypertension: Secondary | ICD-10-CM

## 2023-04-05 DIAGNOSIS — I482 Chronic atrial fibrillation, unspecified: Secondary | ICD-10-CM

## 2023-04-05 DIAGNOSIS — G2581 Restless legs syndrome: Secondary | ICD-10-CM

## 2023-04-05 MED ORDER — SILDENAFIL CITRATE 100 MG PO TABS
100.0000 mg | ORAL_TABLET | ORAL | 1 refills | Status: DC
Start: 2023-04-05 — End: 2023-06-28
  Filled 2023-04-05: qty 8, 30d supply, fill #0
  Filled 2023-05-05: qty 4, 30d supply, fill #1
  Filled 2023-05-30: qty 4, 30d supply, fill #2

## 2023-04-05 MED ORDER — ROPINIROLE HCL 3 MG PO TABS
ORAL_TABLET | ORAL | 1 refills | Status: DC
Start: 2023-04-05 — End: 2023-06-03
  Filled 2023-04-05: qty 45, 30d supply, fill #0
  Filled 2023-05-04: qty 45, 30d supply, fill #1

## 2023-04-05 MED ORDER — METOPROLOL TARTRATE 75 MG PO TABS
75.0000 mg | ORAL_TABLET | Freq: Two times a day (BID) | ORAL | 3 refills | Status: DC
Start: 2023-04-05 — End: 2023-09-15
  Filled 2023-04-05: qty 120, 60d supply, fill #0
  Filled 2023-06-03 (×2): qty 120, 60d supply, fill #1
  Filled 2023-08-04: qty 120, 60d supply, fill #2

## 2023-04-10 ENCOUNTER — Telehealth (INDEPENDENT_AMBULATORY_CARE_PROVIDER_SITE_OTHER): Payer: Medicare Other | Admitting: Family Medicine

## 2023-04-10 ENCOUNTER — Other Ambulatory Visit: Payer: Self-pay | Admitting: Nurse Practitioner

## 2023-04-10 ENCOUNTER — Encounter: Payer: Self-pay | Admitting: Family Medicine

## 2023-04-10 VITALS — Temp 99.5°F | Wt 218.0 lb

## 2023-04-10 DIAGNOSIS — J439 Emphysema, unspecified: Secondary | ICD-10-CM

## 2023-04-10 DIAGNOSIS — R509 Fever, unspecified: Secondary | ICD-10-CM | POA: Diagnosis not present

## 2023-04-10 DIAGNOSIS — M2550 Pain in unspecified joint: Secondary | ICD-10-CM

## 2023-04-10 DIAGNOSIS — E1165 Type 2 diabetes mellitus with hyperglycemia: Secondary | ICD-10-CM | POA: Diagnosis not present

## 2023-04-10 DIAGNOSIS — R051 Acute cough: Secondary | ICD-10-CM | POA: Diagnosis not present

## 2023-04-10 DIAGNOSIS — R0602 Shortness of breath: Secondary | ICD-10-CM

## 2023-04-10 DIAGNOSIS — Z7984 Long term (current) use of oral hypoglycemic drugs: Secondary | ICD-10-CM

## 2023-04-10 DIAGNOSIS — F17200 Nicotine dependence, unspecified, uncomplicated: Secondary | ICD-10-CM

## 2023-04-10 NOTE — Patient Instructions (Signed)
Drink water--you lose more water from having a fever and coughing.  Get the 12 hour version of Mucinex DM. Take this twice daily. This has guaifenesin (expectorant) and dextromethorphan (cough suppressant). This is safer for you to take than the Coricidin, as you will be getting too much tylenol if you take it regularly. (STOP TAKING THE CORICIDIN). It also has a cough suppressant.  You may use tylenol if needed for fever or pain (but recognize that your hydrocodone also has acetaminophen, and not to take too much, separate from your pain medication by 4 hours). Do not take anti-inflammatories like ibuprofen, due to being on blood thinners. Continue to use the albuterol inhaler, every 4-6 hours, if needed for wheezing or shortness of breath.  Consider sinus rinses (Neti-pot or sinus rinse kit) if you develop any sinus pressure, to help clear sinus and nasal secretions, before they drain into your throat and chest.  Continue to limit the sodium in your diet, and daily weights. Use an extra 1/2 tablet of lasix if needed (along with extra potassium).  PLEASE try and quit smoking.  Repeat your COVID test in 24-36 hours.  If positive, contact the office for Paxlovid. If your COVID test remains negative, but you have persistent fever and discolored mucus, then you may need an antibiotic. You are at risk for bacterial infections due to smoking, diabetes.

## 2023-04-10 NOTE — Telephone Encounter (Signed)
Last apt 02/14/23.

## 2023-04-10 NOTE — Progress Notes (Signed)
Start time: 12:16 End time: 12:44  Virtual Visit via Video Note  I connected with Shaun [name] on 04/10/23 by a video enabled telemedicine application and verified that I am speaking with the correct person using two identifiers.  Location: Patient: home Provider: office   I discussed the limitations of evaluation and management by telemedicine and the availability of in person appointments. The patient expressed understanding and agreed to proceed.  His camera was not working--he and his wife were able to see me, but I could not see him.  History of Present Illness:  Chief Complaint  Patient presents with   Shortness of Breath    SOB x yesterday x used inhaler. Gained 8 lbs since Saturday. Cough x yesterday afternoon x cough syrup x some mucus. Headache x last night x no medication. Tightness in chest last night but not currently.    Yesterday afternoon he started coughing, started with chest congestion and head congestion, low grade fever (99.5). He had shortness of breath yesterday, relieved by albuterol. He took an extra 1/2 tablet of furosemide last night. Breathing is somewhat better today.  Still having some shortnes of breath, improved. Cough is productive of clear, slightly yellow phlegm, since yesterday.  No sick contacts.  He has CHF and checks daily weights. His weight was 218 this morning, was 222 yesterday morning. Usually 212-214.  COVID test was negative this morning.  He has been taking Tylenol, and coricidin (acetaminophen, guaifenesin 200mg )  for HA, chest congestion, body aches and pain. Took 1 dose this morning. Takes vicodin twice daily He used Albuterol inhaler last night, helped.   PMH, PSH, SH reviewed  DM, hyperthyroid, GERD, COPD, CHF, atrial fibrillation Smoker Keeps HOB elevated, uses CPAP. Lab Results  Component Value Date   HGBA1C 6.8 (H) 02/14/2023    Outpatient Encounter Medications as of 04/10/2023  Medication Sig Note    albuterol (VENTOLIN HFA) 108 (90 Base) MCG/ACT inhaler INHALE 2 PUFFS INTO THE LUNGS EVERY 6 HOURS AS NEEDED FOR WHEEZING OR SHORTNESS OF BREATH 02/14/2023: As needed   apixaban (ELIQUIS) 5 MG TABS tablet Take 1 tablet (5 mg total) by mouth 2 (two) times daily.    atorvastatin (LIPITOR) 80 MG tablet Take 1 tablet (80 mg total) by mouth every evening.    azelastine (ASTELIN) 0.1 % nasal spray Place 1 spray into both nostrils 2 (two) times daily. Use in each nostril as directed 04/10/2023: Last dose yesterday afternoon   buPROPion (WELLBUTRIN XL) 150 MG 24 hr tablet Take 3 tablets once daily (Patient taking differently: Take 150 mg by mouth 3 (three) times daily.)    docusate sodium (COLACE) 100 MG capsule Take 1 capsule (100 mg total) by mouth daily as needed for mild constipation. (Patient taking differently: Take 100 mg by mouth daily.)    ezetimibe (ZETIA) 10 MG tablet Take 1 tablet (10 mg total) by mouth daily.    ferrous sulfate 325 (65 FE) MG tablet Take 1 tablet (325 mg total) by mouth 3 (three) times a week.    furosemide (LASIX) 40 MG tablet Take 1 tablet (40mg ) daily. May take 1/2 tablet (20mg ) additional if shortness of breath or swelling occurs. 04/10/2023: Takes 1.5 tablets daily (since June)   HYDROcodone-acetaminophen (NORCO/VICODIN) 5-325 MG tablet Take 1-2 tablets by mouth 2 (two) times daily as needed for moderate pain or severe pain.    hydrOXYzine (ATARAX) 25 MG tablet Take 1 tablet (25 mg total) by mouth at bedtime and may repeat dose one  time if needed. For sleep.    metFORMIN (GLUCOPHAGE) 500 MG tablet Take 1 tablet (500 mg total) by mouth 2 (two) times daily before a meal.    methimazole (TAPAZOLE) 5 MG tablet Take 1 tablet (5 mg total) by mouth 4 days a week.    Metoprolol Tartrate 75 MG TABS Take 75 mg by mouth 2 (two) times daily.    omeprazole (PRILOSEC) 40 MG capsule Take 1 capsule by mouth once daily 30 minutes before breakfast    potassium chloride SA (KLOR-CON M) 20 MEQ  tablet Take 1 tablet (20 mEq total) by mouth daily.    rOPINIRole (REQUIP) 3 MG tablet Take 1 tablet by mouth nightly for restless leg, and a 1/2 tab during the day to control breakthrough symptoms    sildenafil (VIAGRA) 100 MG tablet Take 1 tablet (100 mg total) by mouth 30 minutes before activity    sucralfate (CARAFATE) 1 g tablet Take one tablet po BID PRN    tiZANidine (ZANAFLEX) 4 MG tablet Take 1 tablet (4 mg total) by mouth 2 (two) times daily as needed for muscle spasms.    traZODone (DESYREL) 150 MG tablet Take 1 tablet (150 mg total) by mouth at bedtime. 02/14/2023: Takes each night    nitroGLYCERIN (NITROSTAT) 0.4 MG SL tablet Place 1 tablet (0.4 mg total) under the tongue every 5 (five) minutes as needed. (Patient not taking: Reported on 04/10/2023) 04/10/2023: Last used 4-5 weeks ago -aa   polyethylene glycol (MIRALAX / GLYCOLAX) 17 g packet Take 17 g by mouth daily as needed for mild constipation. (Patient not taking: Reported on 04/10/2023) 04/10/2023: Last dose a few months ago   Facility-Administered Encounter Medications as of 04/10/2023  Medication   lidocaine (XYLOCAINE) 1 % (with pres) injection 5 mL   No Known Allergies  ROS:  low grade fevers, URI symptoms per HPI. Headaches at temples, denies sinus headaches. No n/v/d, no rashes, bleeding. Not aware of any swelling in his feet or hands.   Observations/Objective:  Wt 218 lb (98.9 kg) Comment: pt reported.  BMI 33.15 kg/m  T 99.5 today  Patient is alert and oriented.   Intermittent throat clearing and cough during visit, but speaking comfortably. Exam is limited due to the virtual nature of the visit (and unable to see him)    Assessment and Plan:   Fever, unspecified fever cause - suspect virus,possibly COVID.  Risk for overuse of tylenol reviewed, getting from multiple meds  Acute cough - mucinex DM 12 hour BID, albuterol prn  Pulmonary emphysema, unspecified emphysema type (HCC) - continue albuterol prn.   Encouraged to quit smoking  Type 2 diabetes mellitus with hyperglycemia, without long-term current use of insulin (HCC) - controlled; reviewed increased risks for bacterial infection--smoker, diabetic.  DM is controlled  Tobacco use disorder - encouraged cessation, risks reviewed  SOB (shortness of breath) - multifactorial--poss component of CHF (improved with extra lasix, wt still up), COPD (continue albuterol), and viral vs bacterial. Mucinex DM  Viral URI, possibly COVID.  Test was within 24 hours of symptom onset.   Advised to repeat test in 24-36 hours, and contact us if + Candidate for Paxlovid, advised of need to hold statin while taking. If COVID negative, and if has persistent fever, or purulent phlegm persists/worsens, will need ABX to cover bacterial source. Cont daily weights, with another extra 1/2 dose of lasix if weight is up again tomorrow. Reviewed low sodium diet. Discussed limitations of virtual visit, and the potential need  for in-office f/u if sx persist/worsen.    Drink water--you lose more water from having a fever and coughing.  Get the 12 hour version of Mucinex DM. Take this twice daily. This has guaifenesin (expectorant) and dextromethorphan (cough suppressant). This is safer for you to take than the Coricidin, as you will be getting too much tylenol if you take it regularly. (STOP TAKING THE CORICIDIN). It also has a cough suppressant.  You may use tylenol if needed for fever or pain (but recognize that your hydrocodone also has acetaminophen, and not to take too much, separate from your pain medication by 4 hours). Do not take anti-inflammatories like ibuprofen, due to being on blood thinners. Continue to use the albuterol inhaler, every 4-6 hours, if needed for wheezing or shortness of breath.  Consider sinus rinses (Neti-pot or sinus rinse kit) if you develop any sinus pressure, to help clear sinus and nasal secretions, before they drain into your throat  and chest.  Continue to limit the sodium in your diet, and daily weights. Use an extra 1/2 tablet of lasix if needed (along with extra potassium).  PLEASE try and quit smoking.  Repeat your COVID test in 24-36 hours.  If positive, contact the office for Paxlovid. If your COVID test remains negative, but you have persistent fever and discolored mucus, then you may need an antibiotic. You are at risk for bacterial infections due to smoking, diabetes.   Follow Up Instructions:    I discussed the assessment and treatment plan with the patient. The patient was provided an opportunity to ask questions and all were answered. The patient agreed with the plan and demonstrated an understanding of the instructions.   The patient was advised to call back or seek an in-person evaluation if the symptoms worsen or if the condition fails to improve as anticipated.  I spent 35 minutes dedicated to the care of this patient, including pre-visit review of records, face to face time, post-visit ordering of testing and documentation.    Lavonda Jumbo, MD

## 2023-04-11 ENCOUNTER — Telehealth: Payer: Self-pay | Admitting: Nurse Practitioner

## 2023-04-11 ENCOUNTER — Other Ambulatory Visit (HOSPITAL_BASED_OUTPATIENT_CLINIC_OR_DEPARTMENT_OTHER): Payer: Self-pay

## 2023-04-11 MED ORDER — NIRMATRELVIR/RITONAVIR (PAXLOVID)TABLET
3.0000 | ORAL_TABLET | Freq: Two times a day (BID) | ORAL | 0 refills | Status: AC
Start: 2023-04-11 — End: 2023-04-16
  Filled 2023-04-11: qty 30, 5d supply, fill #0

## 2023-04-11 NOTE — Telephone Encounter (Signed)
Pt had a virtual visit with you yesterday and he says he tested positive for covid and that you would send him in paxlovid. He uses  [facility]

## 2023-04-11 NOTE — Telephone Encounter (Signed)
I have sent in paxlovid for pt as per Dr. Lynelle Doctor notes, if positive he can get rx for paxvloid

## 2023-04-13 ENCOUNTER — Other Ambulatory Visit (HOSPITAL_BASED_OUTPATIENT_CLINIC_OR_DEPARTMENT_OTHER): Payer: Self-pay

## 2023-04-13 MED ORDER — TIZANIDINE HCL 4 MG PO TABS
4.0000 mg | ORAL_TABLET | Freq: Two times a day (BID) | ORAL | 0 refills | Status: DC | PRN
Start: 2023-04-13 — End: 2023-05-17
  Filled 2023-04-13: qty 60, 30d supply, fill #0

## 2023-04-14 ENCOUNTER — Other Ambulatory Visit (HOSPITAL_BASED_OUTPATIENT_CLINIC_OR_DEPARTMENT_OTHER): Payer: Self-pay

## 2023-04-24 ENCOUNTER — Other Ambulatory Visit: Payer: Self-pay | Admitting: Nurse Practitioner

## 2023-04-24 ENCOUNTER — Other Ambulatory Visit (HOSPITAL_BASED_OUTPATIENT_CLINIC_OR_DEPARTMENT_OTHER): Payer: Self-pay

## 2023-04-24 ENCOUNTER — Other Ambulatory Visit: Payer: Self-pay | Admitting: Cardiology

## 2023-04-24 ENCOUNTER — Other Ambulatory Visit: Payer: Self-pay

## 2023-04-24 DIAGNOSIS — M2550 Pain in unspecified joint: Secondary | ICD-10-CM

## 2023-04-24 DIAGNOSIS — G479 Sleep disorder, unspecified: Secondary | ICD-10-CM

## 2023-04-24 MED ORDER — HYDROXYZINE HCL 25 MG PO TABS
25.0000 mg | ORAL_TABLET | Freq: Every evening | ORAL | 2 refills | Status: DC | PRN
  Filled 2023-04-24: qty 60, 30d supply, fill #0
  Filled 2023-05-18: qty 60, 30d supply, fill #1
  Filled 2023-06-17: qty 60, 30d supply, fill #2

## 2023-04-24 MED ORDER — HYDROCODONE-ACETAMINOPHEN 5-325 MG PO TABS
1.0000 | ORAL_TABLET | Freq: Two times a day (BID) | ORAL | 0 refills | Status: DC | PRN
Start: 2023-04-24 — End: 2023-06-21
  Filled 2023-04-24: qty 120, 30d supply, fill #0

## 2023-04-24 NOTE — Telephone Encounter (Signed)
 Last apt 02/14/23.

## 2023-04-26 ENCOUNTER — Other Ambulatory Visit (HOSPITAL_BASED_OUTPATIENT_CLINIC_OR_DEPARTMENT_OTHER): Payer: Self-pay

## 2023-04-26 MED ORDER — POTASSIUM CHLORIDE CRYS ER 20 MEQ PO TBCR
20.0000 meq | EXTENDED_RELEASE_TABLET | Freq: Every day | ORAL | 3 refills | Status: DC
  Filled 2023-04-26: qty 90, 90d supply, fill #0
  Filled 2023-07-18: qty 90, 90d supply, fill #1
  Filled 2023-10-26: qty 90, 90d supply, fill #2
  Filled 2024-01-24: qty 90, 90d supply, fill #3

## 2023-05-01 ENCOUNTER — Other Ambulatory Visit: Payer: Self-pay | Admitting: Nurse Practitioner

## 2023-05-01 DIAGNOSIS — G4700 Insomnia, unspecified: Secondary | ICD-10-CM

## 2023-05-02 MED ORDER — TRAZODONE HCL 150 MG PO TABS
150.0000 mg | ORAL_TABLET | Freq: Every day | ORAL | 1 refills | Status: DC
Start: 2023-05-02 — End: 2023-11-05
  Filled 2023-05-02: qty 90, 90d supply, fill #0
  Filled 2023-08-10: qty 90, 90d supply, fill #1

## 2023-05-03 ENCOUNTER — Other Ambulatory Visit (HOSPITAL_BASED_OUTPATIENT_CLINIC_OR_DEPARTMENT_OTHER): Payer: Self-pay

## 2023-05-05 ENCOUNTER — Other Ambulatory Visit (HOSPITAL_BASED_OUTPATIENT_CLINIC_OR_DEPARTMENT_OTHER): Payer: Self-pay

## 2023-05-06 ENCOUNTER — Other Ambulatory Visit (HOSPITAL_BASED_OUTPATIENT_CLINIC_OR_DEPARTMENT_OTHER): Payer: Self-pay

## 2023-05-09 ENCOUNTER — Ambulatory Visit (INDEPENDENT_AMBULATORY_CARE_PROVIDER_SITE_OTHER): Payer: Medicare Other

## 2023-05-09 DIAGNOSIS — Z0000 Encounter for general adult medical examination without abnormal findings: Secondary | ICD-10-CM | POA: Diagnosis not present

## 2023-05-09 DIAGNOSIS — Z87891 Personal history of nicotine dependence: Secondary | ICD-10-CM | POA: Diagnosis not present

## 2023-05-09 DIAGNOSIS — Z122 Encounter for screening for malignant neoplasm of respiratory organs: Secondary | ICD-10-CM | POA: Diagnosis not present

## 2023-05-09 NOTE — Addendum Note (Signed)
Addended by: Barb Merino on: 05/09/2023 09:55 AM   Modules accepted: Orders

## 2023-05-09 NOTE — Patient Instructions (Signed)
Mr. Gantert , Thank you for taking time to come for your Medicare Wellness Visit. I appreciate your ongoing commitment to your health goals. Please review the following plan we discussed and let me know if I can assist you in the future.   Referrals/Orders/Follow-Ups/Clinician Recommendations: none  This is a list of the screening recommended for you and due dates:  Health Maintenance  Topic Date Due   Screening for Lung Cancer  05/21/2020   Pneumonia Vaccine (2 of 2 - PCV) 08/08/2022   Flu Shot  03/30/2023   Yearly kidney health urinalysis for diabetes  04/20/2023   Complete foot exam   04/20/2023   Eye exam for diabetics  05/30/2023   Hemoglobin A1C  08/16/2023   Yearly kidney function blood test for diabetes  01/12/2024   DTaP/Tdap/Td vaccine (2 - Tdap) 02/25/2024   Medicare Annual Wellness Visit  05/08/2024   Colon Cancer Screening  06/07/2024   HPV Vaccine  Aged Out   COVID-19 Vaccine  Discontinued   Hepatitis C Screening  Discontinued   Zoster (Shingles) Vaccine  Discontinued    Advanced directives: (ACP Link)Information on Advanced Care Planning can be found at [facility] of [place] Advance Health Care Directives Advance Health Care Directives ([email or web address]   Next Medicare Annual Wellness Visit scheduled for next year: Yes  insert Preventive Care attachment Insert FALL PREVENTION attachment if needed

## 2023-05-09 NOTE — Progress Notes (Signed)
Subjective:   Shaun [name] is a 67 y.o. male who presents for Medicare Annual/Subsequent preventive examination.  Visit Complete: Virtual  I connected with  Shaun [name] on 05/09/23 by a audio enabled telemedicine application and verified that I am speaking with the correct person using two identifiers.  Patient Location: Home  Provider Location: Office/Clinic  I discussed the limitations of evaluation and management by telemedicine. The patient expressed understanding and agreed to proceed.  Vital Signs: Unable to obtain new vitals due to this being a telehealth visit.  Review of Systems     Cardiac Risk Factors include: advanced age (>73men, >58 women);diabetes mellitus;dyslipidemia;hypertension;male gender     Objective:    Today's Vitals   05/09/23 0912  PainSc: 7    There is no height or weight on file to calculate BMI.     05/09/2023    9:19 AM 10/30/2022    1:15 AM 10/19/2022    7:22 AM 10/14/2022    9:21 AM 02/02/2022    1:59 PM 11/11/2021   10:56 AM 11/04/2021    6:33 PM  Advanced Directives  Does Patient Have a Medical Advance Directive? No No No No Yes No No  Does patient want to make changes to medical advance directive?     No - Patient declined    Would patient like information on creating a medical advance directive?  No - Patient declined No - Patient declined No - Patient declined  No - Patient declined No - Patient declined    Current Medications (verified) Outpatient Encounter Medications as of 05/09/2023  Medication Sig   albuterol (VENTOLIN HFA) 108 (90 Base) MCG/ACT inhaler INHALE 2 PUFFS INTO THE LUNGS EVERY 6 HOURS AS NEEDED FOR WHEEZING OR SHORTNESS OF BREATH   apixaban (ELIQUIS) 5 MG TABS tablet Take 1 tablet (5 mg total) by mouth 2 (two) times daily.   atorvastatin (LIPITOR) 80 MG tablet Take 1 tablet (80 mg total) by mouth every evening.   azelastine (ASTELIN) 0.1 % nasal spray Place 1 spray into both nostrils 2 (two) times daily. Use  in each nostril as directed   buPROPion (WELLBUTRIN XL) 150 MG 24 hr tablet Take 3 tablets once daily (Patient taking differently: Take 150 mg by mouth 3 (three) times daily.)   docusate sodium (COLACE) 100 MG capsule Take 1 capsule (100 mg total) by mouth daily as needed for mild constipation. (Patient taking differently: Take 100 mg by mouth daily.)   ezetimibe (ZETIA) 10 MG tablet Take 1 tablet (10 mg total) by mouth daily.   ferrous sulfate 325 (65 FE) MG tablet Take 1 tablet (325 mg total) by mouth 3 (three) times a week.   furosemide (LASIX) 40 MG tablet Take 1 tablet (40mg ) daily. May take 1/2 tablet (20mg ) additional if shortness of breath or swelling occurs.   HYDROcodone-acetaminophen (NORCO/VICODIN) 5-325 MG tablet Take 1-2 tablets by mouth 2 (two) times daily as needed for moderate pain or severe pain.   hydrOXYzine (ATARAX) 25 MG tablet Take 1 tablet (25 mg total) by mouth at bedtime and may repeat dose one time if needed. For sleep.   metFORMIN (GLUCOPHAGE) 500 MG tablet Take 1 tablet (500 mg total) by mouth 2 (two) times daily before a meal.   methimazole (TAPAZOLE) 5 MG tablet Take 1 tablet (5 mg total) by mouth 4 days a week.   Metoprolol Tartrate 75 MG TABS Take 75 mg by mouth 2 (two) times daily.   nitroGLYCERIN (NITROSTAT) 0.4 MG  SL tablet Place 1 tablet (0.4 mg total) under the tongue every 5 (five) minutes as needed.   omeprazole (PRILOSEC) 40 MG capsule Take 1 capsule by mouth once daily 30 minutes before breakfast   polyethylene glycol (MIRALAX / GLYCOLAX) 17 g packet Take 17 g by mouth daily as needed for mild constipation.   potassium chloride SA (KLOR-CON M) 20 MEQ tablet Take 1 tablet (20 mEq total) by mouth daily.   rOPINIRole (REQUIP) 3 MG tablet Take 1 tablet by mouth nightly for restless leg, and a 1/2 tab during the day to control breakthrough symptoms   sildenafil (VIAGRA) 100 MG tablet Take 1 tablet (100 mg total) by mouth 30 minutes before activity   sucralfate  (CARAFATE) 1 g tablet Take one tablet po BID PRN   tiZANidine (ZANAFLEX) 4 MG tablet Take 1 tablet (4 mg total) by mouth 2 (two) times daily as needed for muscle spasms.   traZODone (DESYREL) 150 MG tablet Take 1 tablet (150 mg total) by mouth at bedtime.   Facility-Administered Encounter Medications as of 05/09/2023  Medication   lidocaine (XYLOCAINE) 1 % (with pres) injection 5 mL    Allergies (verified) Patient has no known allergies.   History: Past Medical History:  Diagnosis Date   Anemia    Anginal pain (HCC)    Anxiety    Asthma    Bacterial URI 08/29/2022   CAD (coronary artery disease)    DES to distal circumflex 2016   Cataract    CHF (congestive heart failure) (HCC)    Colon polyps    30 colon polyps found on first colonoscopy   Depression    Diastolic heart failure (HCC)    Diverticulitis    DJD (degenerative joint disease)    Dyspnea    Dysrhythmia    A-Fib   Genetic testing 09/09/2014   Negative genetic testing on the ColoNext panel test and the MSH2 inversion testing.  The ColoNext gene panel offered by [facility] and includes sequencing and rearrangement analysis for the following 17 genes: APC, BMPR1A, CDH1, CHEK2, EPCAM, GREM1, MLH1, MSH2, MSH6, MUTYH, PMS2, POLD1, POLE, PTEN, SMAD4, STK11, and TP53.   The report date is 09/08/14.      GERD (gastroesophageal reflux disease)    History of kidney stones    Hyperlipidemia    Hypernatremia 08/22/2021   Hypertension    Insomnia    Obstructive sleep apnea 12/2009   01/26/2010 AHI 83/hr   Permanent atrial fibrillation (HCC)    Onset 2006 paroxysmal then progressive to persistent   Pneumonia    PUD (peptic ulcer disease)    1980s   RLS (restless legs syndrome)    Sinusitis    Skin cancer    Type 2 diabetes mellitus (HCC)    Past Surgical History:  Procedure Laterality Date   BIOPSY  07/17/2018   Procedure: BIOPSY;  Surgeon: West Bali, MD;  [facility];  Service: Endoscopy;;  colon    BOWEL RESECTION  09/17/2018   SMALL BOWEL RESECTION: 71 CM    CARDIAC CATHETERIZATION N/A 07/21/2015   Procedure: Left Heart Cath and Coronary Angiography;  Surgeon: Peter M Swaziland, MD;  Location: [facility] INVASIVE CV LAB;  Service: Cardiovascular;  Laterality: N/A;   CARDIAC CATHETERIZATION N/A 07/21/2015   Procedure: Coronary Stent Intervention;  Surgeon: Peter M Swaziland, MD;  Location: [facility] INVASIVE CV LAB;  Service: Cardiovascular;  Laterality: N/A;   CIRCUMCISION N/A 04/05/2019   Procedure: CIRCUMCISION ADULT;  Surgeon: Annabell Howells,  Jonny Ruiz, MD;  Location: [facility];  Service: Urology;  Laterality: N/A;   COLONOSCOPY N/A 05/19/2014   Dr. Cyndi Bender diverticulosis/moderate external hemorrhoids, >20 simple adenomas. Genetic screening negative.    COLONOSCOPY WITH PROPOFOL N/A 07/17/2018   Dr. Darrick Penna: Diverticulosis, external/internal hemorrhoids, 32 colon polyps removed.  ten tubular adenomas removed with no high-grade dysplasia.  Advised to have surveillance colonoscopy in 3 years.   COLONOSCOPY WITH PROPOFOL N/A 06/07/2021   Procedure: COLONOSCOPY WITH PROPOFOL;  Surgeon: Lanelle Bal, DO;  [facility];  Service: Endoscopy;  Laterality: N/A;  9:30 / ASA 3  (Pt was told that his time will be given at Pre-op)   CORONARY PRESSURE/FFR STUDY Left 06/08/2017   Procedure: INTRAVASCULAR PRESSURE WIRE/FFR STUDY;  Surgeon: Yvonne Kendall, MD;  Location: [facility] INVASIVE CV LAB;  Service: Cardiovascular;  Laterality: Left;  LAD and CFX   ESOPHAGOGASTRODUODENOSCOPY (EGD) WITH PROPOFOL N/A 07/17/2018   Dr. Darrick Penna: Low-grade narrowing Schatzki ring at the GE junction status post dilation.  Gastritis.  Biopsy with mild nonspecific reactive gastropathy.  No H. pylori.   GIVENS CAPSULE STUDY N/A 06/24/2019   normal   HERNIA REPAIR  1986   Left inguinal   INCISIONAL HERNIA REPAIR N/A 10/19/2022   Procedure: OPEN INCISIONAL HERNIA REPAIR;  Surgeon: Fritzi Mandes, MD;  Location: [facility] OR;  Service: General;   Laterality: N/A;   INSERTION OF MESH N/A 10/19/2022   Procedure: INSERTION OF MESH;  Surgeon: Fritzi Mandes, MD;  Location: [facility] OR;  Service: General;  Laterality: N/A;   LAPAROTOMY N/A 09/17/2018   Procedure: EXPLORATORY LAPAROTOMY;  Surgeon: Lucretia Roers, MD;  Location: [facility];  Service: General;  Laterality: N/A;   LEFT HEART CATH AND CORONARY ANGIOGRAPHY N/A 06/08/2017   Procedure: LEFT HEART CATH AND CORONARY ANGIOGRAPHY;  Surgeon: Yvonne Kendall, MD;  Location: [facility] INVASIVE CV LAB;  Service: Cardiovascular;  Laterality: N/A;   POLYPECTOMY  07/17/2018   Procedure: POLYPECTOMY;  Surgeon: West Bali, MD;  [facility];  Service: Endoscopy;;  colon   POLYPECTOMY  06/07/2021   Procedure: POLYPECTOMY INTESTINAL;  Surgeon: Lanelle Bal, DO;  [facility];  Service: Endoscopy;;   ROTATOR CUFF REPAIR     Right   SAVORY DILATION N/A 07/17/2018   Procedure: SAVORY DILATION;  Surgeon: West Bali, MD;  [facility];  Service: Endoscopy;  Laterality: N/A;   Family History  Problem Relation Age of Onset   Hypertension Mother    Breast cancer Mother 80       brain/bone    Heart attack Father    Skin cancer Sister 17   Diabetes Brother    Parkinson's disease Brother    Brain cancer Maternal Uncle    Cancer Maternal Uncle        NOS   Breast cancer Cousin        maternal cousin dx <50   Cancer Cousin    Colon cancer Neg Hx    Social History   Socioeconomic History   Marital status: Married    Spouse name: [name] Willner   Number of children: 5   Years of education: 12   Highest education level: 12th grade  Occupational History   Occupation: employed    Associate Professor: Programme researcher, broadcasting/film/video    Comment: full-time  Tobacco Use   Smoking status: Some Days    Current packs/day: 0.00    Average packs/day: 0.5 packs/day for 51.0 years (25.5 ttl pk-yrs)  Types: Cigarettes    Start date: 07/24/1970    Last attempt to quit: 08/06/2021    Years since  quitting: 1.7    Passive exposure: Past   Smokeless tobacco: Never  Vaping Use   Vaping status: Never Used  Substance and Sexual Activity   Alcohol use: No    Alcohol/week: 0.0 standard drinks of alcohol   Drug use: Yes    Types: Hydrocodone   Sexual activity: Yes    Partners: Female  Other Topics Concern   Not on file  Social History Narrative   Not on file   Social Determinants of Health   Financial Resource Strain: Low Risk  (05/09/2023)   Overall Financial Resource Strain (CARDIA)    Difficulty of Paying Living Expenses: Not hard at all  Recent Concern: Financial Resource Strain - Medium Risk (02/14/2023)   Overall Financial Resource Strain (CARDIA)    Difficulty of Paying Living Expenses: Somewhat hard  Food Insecurity: No Food Insecurity (05/09/2023)   Hunger Vital Sign    Worried About Running Out of Food in the Last Year: Never true    Ran Out of Food in the Last Year: Never true  Recent Concern: Food Insecurity - Food Insecurity Present (02/14/2023)   Hunger Vital Sign    Worried About Running Out of Food in the Last Year: Sometimes true    Ran Out of Food in the Last Year: Never true  Transportation Needs: No Transportation Needs (05/09/2023)   PRAPARE - Administrator, Civil Service (Medical): No    Lack of Transportation (Non-Medical): No  Physical Activity: Sufficiently Active (05/09/2023)   Exercise Vital Sign    Days of Exercise per Week: 4 days    Minutes of Exercise per Session: 40 min  Stress: No Stress Concern Present (05/09/2023)   [employer] of Occupational Health - Occupational Stress Questionnaire    Feeling of Stress : Only a little  Recent Concern: Stress - Stress Concern Present (02/14/2023)   [employer] of Occupational Health - Occupational Stress Questionnaire    Feeling of Stress : Rather much  Social Connections: Moderately Isolated (05/09/2023)   Social Connection and Isolation Panel [NHANES]    Frequency of Communication  with Friends and Family: Three times a week    Frequency of Social Gatherings with Friends and Family: Once a week    Attends Religious Services: Never    Database administrator or Organizations: No    Attends Engineer, structural: Never    Marital Status: Married    Tobacco Counseling Ready to quit: Yes Counseling given: Not Answered   Clinical Intake:  Pre-visit preparation completed: Yes  Pain : 0-10 Pain Score: 7  Pain Type: Chronic pain Pain Location: Back Pain Orientation: Lower Pain Radiating Towards: down to hip Pain Descriptors / Indicators: Aching Pain Onset: More than a month ago Pain Frequency: Constant     Nutritional Risks: None Diabetes: Yes CBG done?: No Did pt. bring in CBG monitor from home?: No  How often do you need to have someone help you when you read instructions, pamphlets, or other written materials from your doctor or pharmacy?: 1 - Never  Interpreter Needed?: No  Information entered by :: NAllen LPN   Activities of Daily Living    05/09/2023    9:13 AM 10/30/2022    1:15 AM  In your present state of health, do you have any difficulty performing the following activities:  Hearing? 0 0  Vision?  0 0  Difficulty concentrating or making decisions? 0 0  Walking or climbing stairs? 1 0  Comment due to hip   Dressing or bathing? 0 0  Doing errands, shopping? 0 0  Preparing Food and eating ? N   Using the Toilet? N   In the past six months, have you accidently leaked urine? N   Do you have problems with loss of bowel control? N   Managing your Medications? N   Managing your Finances? N   Housekeeping or managing your Housekeeping? N     Patient Care Team: Early, Sung Amabile, NP as PCP - General (Nurse Practitioner) Lewayne Bunting, MD as PCP - Cardiology (Cardiology) West Bali, MD (Inactive) as Consulting Physician (Gastroenterology) [name] Madolyn Frieze, MD as Consulting Physician (Cardiology) Ortho, [facility]  (Specialist) [facility], [place], [place]  Indicate any recent Medical Services you may have received from other than [facility] providers in the past year (date may be approximate).     Assessment:   This is a routine wellness examination for Onofre.  Hearing/Vision screen Hearing Screening - Comments:: Denies hearing issues Vision Screening - Comments:: Regular eye exams, [facility]   Goals Addressed             This Visit's Progress    Patient Stated       05/09/2023, wants to lose weight       Depression Screen    05/09/2023    9:21 AM 12/27/2022    1:24 PM 11/07/2022    9:33 AM 05/31/2022    1:08 PM 04/19/2022   10:17 AM 03/22/2022    8:27 AM 02/08/2022    9:43 AM  PHQ 2/9 Scores  PHQ - 2 Score 2 0 0 0 1 4 0  PHQ- 9 Score 6   0 10 12     Fall Risk    05/09/2023    9:20 AM 12/27/2022    1:24 PM 12/01/2022   10:49 AM 11/07/2022    9:33 AM 07/29/2022    3:57 PM  Fall Risk   Falls in the past year? 1 0 1 0 1  Comment lost balance      Number falls in past yr: 1  1 0 1  Injury with Fall? 1  0 0 1  Comment cut head      Risk for fall due to : History of fall(s);Impaired balance/gait;Impaired mobility;Medication side effect   No Fall Risks Other (Comment)  Risk for fall due to: Comment     felt weak after being in the hospital  Follow up Falls prevention discussed;Falls evaluation completed   Falls evaluation completed Falls evaluation completed    MEDICARE RISK AT HOME: Medicare Risk at Home Any stairs in or around the home?: No Home free of loose throw rugs in walkways, pet beds, electrical cords, etc?: Yes Adequate lighting in your home to reduce risk of falls?: Yes Life alert?: No Use of a cane, walker or w/c?: Yes Grab bars in the bathroom?: Yes Shower chair or bench in shower?: No Elevated toilet seat or a handicapped toilet?: No  TIMED UP AND GO:  Was the test performed?  No    Cognitive Function:        05/09/2023    9:23 AM  6CIT Screen   What Year? 0 points  What month? 0 points  What time? 0 points  Count back from 20 0 points  Months in reverse 0 points  Repeat  phrase 2 points  Total Score 2 points    Immunizations Immunization History  Administered Date(s) Administered   COVID-19, mRNA, vaccine(Comirnaty)12 years and older 07/26/2022   Fluad Quad(high Dose 65+) 08/08/2021   Influenza Whole 05/14/2010   Influenza, Quadrivalent, Recombinant, Inj, Pf 05/31/2022   Influenza,inj,Quad PF,6+ Mos 06/30/2014, 06/18/2015, 05/17/2016, 05/11/2017, 06/21/2018, 07/01/2019, 05/26/2020, 04/19/2022   Influenza,inj,quad, With Preservative 05/29/2017   Influenza-Unspecified 05/30/2011   PFIZER(Purple Top)SARS-COV-2 Vaccination 11/11/2019, 12/12/2019, 07/30/2020, 07/26/2022   Pneumococcal Polysaccharide-23 08/29/2010, 08/08/2021   Td 02/24/2014   Zoster Recombinant(Shingrix) 03/30/2018    TDAP status: Up to date  Flu Vaccine status: Due, Education has been provided regarding the importance of this vaccine. Advised may receive this vaccine at local pharmacy or Health Dept. Aware to provide a copy of the vaccination record if obtained from local pharmacy or Health Dept. Verbalized acceptance and understanding.  Pneumococcal vaccine status: Due, Education has been provided regarding the importance of this vaccine. Advised may receive this vaccine at local pharmacy or Health Dept. Aware to provide a copy of the vaccination record if obtained from local pharmacy or Health Dept. Verbalized acceptance and understanding.  Covid-19 vaccine status: Completed vaccines  Qualifies for Shingles Vaccine? Yes   Zostavax completed No   Shingrix Completed?: needs second dose  Screening Tests Health Maintenance  Topic Date Due   Lung Cancer Screening  05/21/2020   Pneumonia Vaccine 59+ Years old (2 of 2 - PCV) 08/08/2022   INFLUENZA VACCINE  03/30/2023   Diabetic kidney evaluation - Urine ACR  04/20/2023   FOOT EXAM  04/20/2023    OPHTHALMOLOGY EXAM  05/30/2023   HEMOGLOBIN A1C  08/16/2023   Diabetic kidney evaluation - eGFR measurement  01/12/2024   DTaP/Tdap/Td (2 - Tdap) 02/25/2024   Medicare Annual Wellness (AWV)  05/08/2024   Colonoscopy  06/07/2024   HPV VACCINES  Aged Out   COVID-19 Vaccine  Discontinued   Hepatitis C Screening  Discontinued   Zoster Vaccines- Shingrix  Discontinued    Health Maintenance  Health Maintenance Due  Topic Date Due   Lung Cancer Screening  05/21/2020   Pneumonia Vaccine 64+ Years old (2 of 2 - PCV) 08/08/2022   INFLUENZA VACCINE  03/30/2023   Diabetic kidney evaluation - Urine ACR  04/20/2023   FOOT EXAM  04/20/2023    Colorectal cancer screening: Type of screening: Colonoscopy. Completed 06/07/2021. Repeat every 3 years  Lung Cancer Screening: (Low Dose CT Chest recommended if Age 41-80 years, 20 pack-year currently smoking OR have quit w/in 15years.) does qualify.   Lung Cancer Screening Referral: referral today  Additional Screening:  Hepatitis C Screening: does not qualify;   Vision Screening: Recommended annual ophthalmology exams for early detection of glaucoma and other disorders of the eye. Is the patient up to date with their annual eye exam?  Yes  Who is the provider or what is the name of the office in which the patient attends annual eye exams? [facility] If pt is not established with a provider, would they like to be referred to a provider to establish care? No .   Dental Screening: Recommended annual dental exams for proper oral hygiene  Diabetic Foot Exam: Diabetic Foot Exam: Overdue, Pt has been advised about the importance in completing this exam. Pt is scheduled for diabetic foot exam on next appointment.  Community Resource Referral / Chronic Care Management: CRR required this visit?  No   CCM required this visit?  No     Plan:  I have personally reviewed and noted the following in the patient's chart:   Medical and  social history Use of alcohol, tobacco or illicit drugs  Current medications and supplements including opioid prescriptions. Patient is not currently taking opioid prescriptions. Functional ability and status Nutritional status Physical activity Advanced directives List of other physicians Hospitalizations, surgeries, and ER visits in previous 12 months Vitals Screenings to include cognitive, depression, and falls Referrals and appointments  In addition, I have reviewed and discussed with patient certain preventive protocols, quality metrics, and best practice recommendations. A written personalized care plan for preventive services as well as general preventive health recommendations were provided to patient.     Barb Merino, LPN   1/61/0960   After Visit Summary: (MyChart) Due to this being a telephonic visit, the after visit summary with patients personalized plan was offered to patient via MyChart   Nurse Notes: none

## 2023-05-15 ENCOUNTER — Encounter: Payer: Self-pay | Admitting: *Deleted

## 2023-05-17 ENCOUNTER — Other Ambulatory Visit: Payer: Self-pay | Admitting: Nurse Practitioner

## 2023-05-17 DIAGNOSIS — M2550 Pain in unspecified joint: Secondary | ICD-10-CM

## 2023-05-17 NOTE — Telephone Encounter (Signed)
Last apt 04/10/23

## 2023-05-18 ENCOUNTER — Other Ambulatory Visit: Payer: Self-pay

## 2023-05-20 ENCOUNTER — Other Ambulatory Visit (HOSPITAL_BASED_OUTPATIENT_CLINIC_OR_DEPARTMENT_OTHER): Payer: Self-pay

## 2023-05-20 MED ORDER — TIZANIDINE HCL 4 MG PO TABS
4.0000 mg | ORAL_TABLET | Freq: Two times a day (BID) | ORAL | 0 refills | Status: DC | PRN
  Filled 2023-05-20: qty 60, 30d supply, fill #0

## 2023-05-26 ENCOUNTER — Other Ambulatory Visit (HOSPITAL_BASED_OUTPATIENT_CLINIC_OR_DEPARTMENT_OTHER): Payer: Self-pay

## 2023-06-02 ENCOUNTER — Other Ambulatory Visit (HOSPITAL_BASED_OUTPATIENT_CLINIC_OR_DEPARTMENT_OTHER): Payer: Self-pay

## 2023-06-02 MED ORDER — COMIRNATY 30 MCG/0.3ML IM SUSY
0.3000 mL | PREFILLED_SYRINGE | Freq: Once | INTRAMUSCULAR | 0 refills | Status: AC
  Filled 2023-06-02: qty 0.3, 1d supply, fill #0

## 2023-06-02 MED ORDER — FLUAD 0.5 ML IM SUSY
0.5000 mL | PREFILLED_SYRINGE | Freq: Once | INTRAMUSCULAR | 0 refills | Status: AC
  Filled 2023-06-02: qty 0.5, 1d supply, fill #0

## 2023-06-03 ENCOUNTER — Other Ambulatory Visit: Payer: Self-pay | Admitting: Nurse Practitioner

## 2023-06-03 ENCOUNTER — Other Ambulatory Visit (HOSPITAL_BASED_OUTPATIENT_CLINIC_OR_DEPARTMENT_OTHER): Payer: Self-pay

## 2023-06-03 DIAGNOSIS — G2581 Restless legs syndrome: Secondary | ICD-10-CM

## 2023-06-05 ENCOUNTER — Other Ambulatory Visit (HOSPITAL_BASED_OUTPATIENT_CLINIC_OR_DEPARTMENT_OTHER): Payer: Self-pay

## 2023-06-05 MED ORDER — ROPINIROLE HCL 3 MG PO TABS
ORAL_TABLET | ORAL | 2 refills | Status: DC
  Filled 2023-06-05: qty 45, 30d supply, fill #0
  Filled 2023-07-05: qty 45, 30d supply, fill #1
  Filled 2023-08-02: qty 45, 30d supply, fill #2

## 2023-06-17 ENCOUNTER — Emergency Department (HOSPITAL_BASED_OUTPATIENT_CLINIC_OR_DEPARTMENT_OTHER): Payer: Medicare Other

## 2023-06-17 ENCOUNTER — Encounter (HOSPITAL_BASED_OUTPATIENT_CLINIC_OR_DEPARTMENT_OTHER): Payer: Self-pay

## 2023-06-17 ENCOUNTER — Emergency Department (HOSPITAL_BASED_OUTPATIENT_CLINIC_OR_DEPARTMENT_OTHER)
Admission: EM | Admit: 2023-06-17 | Discharge: 2023-06-17 | Disposition: A | Discharge status: Home / Self Care | Payer: Medicare Other | Attending: Emergency Medicine | Admitting: Emergency Medicine

## 2023-06-17 ENCOUNTER — Other Ambulatory Visit (HOSPITAL_BASED_OUTPATIENT_CLINIC_OR_DEPARTMENT_OTHER): Payer: Self-pay

## 2023-06-17 DIAGNOSIS — I5030 Unspecified diastolic (congestive) heart failure: Secondary | ICD-10-CM | POA: Insufficient documentation

## 2023-06-17 DIAGNOSIS — Z7901 Long term (current) use of anticoagulants: Secondary | ICD-10-CM | POA: Insufficient documentation

## 2023-06-17 DIAGNOSIS — I110 Hypertensive heart disease with heart failure: Secondary | ICD-10-CM | POA: Diagnosis not present

## 2023-06-17 DIAGNOSIS — Z1152 Encounter for screening for COVID-19: Secondary | ICD-10-CM | POA: Insufficient documentation

## 2023-06-17 DIAGNOSIS — I2510 Atherosclerotic heart disease of native coronary artery without angina pectoris: Secondary | ICD-10-CM | POA: Insufficient documentation

## 2023-06-17 DIAGNOSIS — J45909 Unspecified asthma, uncomplicated: Secondary | ICD-10-CM | POA: Insufficient documentation

## 2023-06-17 DIAGNOSIS — Z85828 Personal history of other malignant neoplasm of skin: Secondary | ICD-10-CM | POA: Insufficient documentation

## 2023-06-17 DIAGNOSIS — J441 Chronic obstructive pulmonary disease with (acute) exacerbation: Secondary | ICD-10-CM | POA: Diagnosis not present

## 2023-06-17 DIAGNOSIS — I4891 Unspecified atrial fibrillation: Secondary | ICD-10-CM | POA: Insufficient documentation

## 2023-06-17 DIAGNOSIS — Z7984 Long term (current) use of oral hypoglycemic drugs: Secondary | ICD-10-CM | POA: Insufficient documentation

## 2023-06-17 DIAGNOSIS — R0602 Shortness of breath: Secondary | ICD-10-CM

## 2023-06-17 DIAGNOSIS — E119 Type 2 diabetes mellitus without complications: Secondary | ICD-10-CM | POA: Diagnosis not present

## 2023-06-17 DIAGNOSIS — R0789 Other chest pain: Secondary | ICD-10-CM

## 2023-06-17 HISTORY — DX: Unspecified atrial fibrillation: I48.91

## 2023-06-17 LAB — BASIC METABOLIC PANEL
Anion gap: 11 (ref 5–15)
BUN: 18 mg/dL (ref 8–23)
CO2: 23 mmol/L (ref 22–32)
Calcium: 9.4 mg/dL (ref 8.9–10.3)
Chloride: 103 mmol/L (ref 98–111)
Creatinine, Ser: 1.11 mg/dL (ref 0.61–1.24)
GFR, Estimated: >60 mL/min (ref >60)
Glucose, Bld: 177 mg/dL — ABNORMAL HIGH (ref 70–99)
Potassium: 4.1 mmol/L (ref 3.5–5.1)
Sodium: 137 mmol/L (ref 135–145)

## 2023-06-17 LAB — CBC
HCT: 42.8 % (ref 39.0–52.0)
Hemoglobin: 14.4 g/dL (ref 13.0–17.0)
MCH: 27.9 pg (ref 26.0–34.0)
MCHC: 33.6 g/dL (ref 30.0–36.0)
MCV: 82.8 fL (ref 80.0–100.0)
Platelets: 163 10*3/uL (ref 150–400)
RBC: 5.17 MIL/uL (ref 4.22–5.81)
RDW: 14.1 % (ref 11.5–15.5)
WBC: 10 10*3/uL (ref 4.0–10.5)
nRBC: 0 % (ref 0.0–0.2)

## 2023-06-17 LAB — RESP PANEL BY RT-PCR (RSV, FLU A&B, COVID)  RVPGX2
Influenza A by PCR: NEGATIVE
Influenza B by PCR: NEGATIVE
Resp Syncytial Virus by PCR: NEGATIVE
SARS Coronavirus 2 by RT PCR: NEGATIVE

## 2023-06-17 LAB — TROPONIN I (HIGH SENSITIVITY)
Troponin I (High Sensitivity): 3 ng/L (ref <18)
Troponin I (High Sensitivity): 3 ng/L (ref <18)

## 2023-06-17 LAB — BRAIN NATRIURETIC PEPTIDE: B Natriuretic Peptide: 62.7 pg/mL (ref 0.0–100.0)

## 2023-06-17 MED ORDER — IPRATROPIUM-ALBUTEROL 0.5-2.5 (3) MG/3ML IN SOLN
RESPIRATORY_TRACT | Status: AC
  Administered 2023-06-17: 3 mL via RESPIRATORY_TRACT
  Filled 2023-06-17: qty 3

## 2023-06-17 MED ORDER — IPRATROPIUM-ALBUTEROL 0.5-2.5 (3) MG/3ML IN SOLN
3.0000 mL | Freq: Once | RESPIRATORY_TRACT | Status: AC
  Administered 2023-06-17: 3 mL via RESPIRATORY_TRACT
  Filled 2023-06-17: qty 3

## 2023-06-17 MED ORDER — PREDNISONE 10 MG PO TABS
40.0000 mg | ORAL_TABLET | Freq: Every day | ORAL | 0 refills | Status: AC
  Filled 2023-06-17: qty 16, 4d supply, fill #0

## 2023-06-17 MED ORDER — AZITHROMYCIN 250 MG PO TABS
250.0000 mg | ORAL_TABLET | Freq: Every day | ORAL | 0 refills | Status: DC
  Filled 2023-06-17: qty 6, 5d supply, fill #0

## 2023-06-17 MED ORDER — ALBUTEROL SULFATE (2.5 MG/3ML) 0.083% IN NEBU
2.5000 mg | INHALATION_SOLUTION | Freq: Four times a day (QID) | RESPIRATORY_TRACT | 12 refills | Status: AC | PRN
  Filled 2023-06-17: qty 75, 7d supply, fill #0

## 2023-06-17 MED ORDER — IPRATROPIUM-ALBUTEROL 0.5-2.5 (3) MG/3ML IN SOLN: 3.0000 mL | Freq: Once | RESPIRATORY_TRACT | Status: AC

## 2023-06-17 MED ORDER — METHYLPREDNISOLONE SODIUM SUCC 125 MG IJ SOLR
125.0000 mg | Freq: Once | INTRAMUSCULAR | Status: AC
  Administered 2023-06-17: 125 mg via INTRAVENOUS
  Filled 2023-06-17: qty 2

## 2023-06-17 NOTE — ED Triage Notes (Signed)
He c/o intermittent left-sided chest pains since yesterday. He states he took sl ntg today with come relief. He tells me that prior to today his last usage of ntg was ~2 weeks ago. His skin is normal, warm and dry and he tells me that he feels like "I can't catch my breath".

## 2023-06-17 NOTE — ED Notes (Signed)
Pt tells me that he has some congestion past 2 days, has an inhaler at home that helped some. He also states a family member has a nebulizer and he had 2 treatments and that did help.

## 2023-06-17 NOTE — ED Provider Notes (Signed)
[place] EMERGENCY DEPARTMENT AT [facility] Provider Note   CSN: [id number] Arrival date & time: 06/17/23  1134     History {Add pertinent medical, surgical, social history, OB history to HPI:1} Chief Complaint  Patient presents with   Chest Pain    Shaun [name] is a 67 y.o. male.  HPI      Trouble breathing in chest, feels like squeezing, 3 days ago congestion, chest and runny nose, today felt the shortness of breath, tightness, chest pain yesterday, today more like pressure, squeezing. DOes feel like the pain he had prior to angioplasty. Has been coughing, at times coughing upnot sure what it looks like, not coughing up blood Feels better after breathing treatments for short period of time No leg swelling No fever Didn't take anything today but has been adherent to eliquis before  Nausea from time to time, no abdominal pain or vomiting Sore throat last night    Past Medical History:  Diagnosis Date   A-fib (HCC)    Anemia    Anginal pain (HCC)    Anxiety    Asthma    Bacterial URI 08/29/2022   CAD (coronary artery disease)    DES to distal circumflex 2016   Cataract    CHF (congestive heart failure) (HCC)    Colon polyps    30 colon polyps found on first colonoscopy   Depression    Diastolic heart failure (HCC)    Diverticulitis    DJD (degenerative joint disease)    Dyspnea    Dysrhythmia    A-Fib   Genetic testing 09/09/2014   Negative genetic testing on the ColoNext panel test and the MSH2 inversion testing.  The ColoNext gene panel offered by [facility] and includes sequencing and rearrangement analysis for the following 17 genes: APC, BMPR1A, CDH1, CHEK2, EPCAM, GREM1, MLH1, MSH2, MSH6, MUTYH, PMS2, POLD1, POLE, PTEN, SMAD4, STK11, and TP53.   The report date is 09/08/14.      GERD (gastroesophageal reflux disease)    History of kidney stones    Hyperlipidemia    Hypernatremia 08/22/2021   Hypertension    Insomnia     Obstructive sleep apnea 12/2009   01/26/2010 AHI 83/hr   Permanent atrial fibrillation (HCC)    Onset 2006 paroxysmal then progressive to persistent   Pneumonia    PUD (peptic ulcer disease)    1980s   RLS (restless legs syndrome)    Sinusitis    Skin cancer    Type 2 diabetes mellitus (HCC)      Home Medications Prior to Admission medications   Medication Sig Start Date End Date Taking? Authorizing Provider  albuterol (VENTOLIN HFA) 108 (90 Base) MCG/ACT inhaler INHALE 2 PUFFS INTO THE LUNGS EVERY 6 HOURS AS NEEDED FOR WHEEZING OR SHORTNESS OF BREATH 01/12/23   Tysinger, Kermit Balo, PA-C  apixaban (ELIQUIS) 5 MG TABS tablet Take 1 tablet (5 mg total) by mouth 2 (two) times daily. 02/15/23   Lewayne Bunting, MD  atorvastatin (LIPITOR) 80 MG tablet Take 1 tablet (80 mg total) by mouth every evening. 03/14/23   Lewayne Bunting, MD  azelastine (ASTELIN) 0.1 % nasal spray Place 1 spray into both nostrils 2 (two) times daily. Use in each nostril as directed 10/12/21   Janeece Agee, NP  buPROPion (WELLBUTRIN XL) 150 MG 24 hr tablet Take 3 tablets once daily Patient taking differently: Take 150 mg by mouth 3 (three) times daily. 07/29/22   [name], NP  docusate sodium (COLACE) 100 MG capsule Take 1 capsule (100 mg total) by mouth daily as needed for mild constipation. Patient taking differently: Take 100 mg by mouth daily. 07/29/22   [name], NP  ezetimibe (ZETIA) 10 MG tablet Take 1 tablet (10 mg total) by mouth daily. 07/28/22 09/14/23  Lewayne Bunting, MD  ferrous sulfate 325 (65 FE) MG tablet Take 1 tablet (325 mg total) by mouth 3 (three) times a week. 03/01/23   [name], NP  furosemide (LASIX) 40 MG tablet Take 1 tablet (40mg ) daily. May take 1/2 tablet (20mg ) additional if shortness of breath or swelling occurs. 02/14/23   [name], NP  HYDROcodone-acetaminophen (NORCO/VICODIN) 5-325 MG tablet Take 1-2 tablets by mouth 2 (two) times daily as needed for moderate pain or  severe pain. 04/24/23   [name], NP  hydrOXYzine (ATARAX) 25 MG tablet Take 1 tablet (25 mg total) by mouth at bedtime and may repeat dose one time if needed. For sleep. 04/24/23   [name], NP  metFORMIN (GLUCOPHAGE) 500 MG tablet Take 1 tablet (500 mg total) by mouth 2 (two) times daily before a meal. 07/29/22   Early, Sung Amabile, NP  methimazole (TAPAZOLE) 5 MG tablet Take 1 tablet (5 mg total) by mouth 4 days a week. 01/20/23   [name], Konrad Dolores, MD  Metoprolol Tartrate 75 MG TABS Take 75 mg by mouth 2 (two) times daily. 04/05/23   [name], NP  nitroGLYCERIN (NITROSTAT) 0.4 MG SL tablet Place 1 tablet (0.4 mg total) under the tongue every 5 (five) minutes as needed. 07/29/22   [name], NP  omeprazole (PRILOSEC) 40 MG capsule Take 1 capsule by mouth once daily 30 minutes before breakfast 01/24/23   Lanelle Bal, DO  polyethylene glycol (MIRALAX / GLYCOLAX) 17 g packet Take 17 g by mouth daily as needed for mild constipation. 10/19/22   Fritzi Mandes, MD  potassium chloride SA (KLOR-CON M) 20 MEQ tablet Take 1 tablet (20 mEq total) by mouth daily. 04/26/23   Lewayne Bunting, MD  rOPINIRole (REQUIP) 3 MG tablet Take 1 tablet by mouth nightly for restless leg, and a 1/2 tab during the day to control breakthrough symptoms 06/05/23   Early, Sung Amabile, NP  sildenafil (VIAGRA) 100 MG tablet Take 1 tablet (100 mg total) by mouth 30 minutes before activity 04/05/23   Early, Sung Amabile, NP  sucralfate (CARAFATE) 1 g tablet Take one tablet po BID PRN 01/08/20   Merlyn Albert, MD  tiZANidine (ZANAFLEX) 4 MG tablet Take 1 tablet (4 mg total) by mouth 2 (two) times daily as needed for muscle spasms. 05/20/23   [name], NP  traZODone (DESYREL) 150 MG tablet Take 1 tablet (150 mg total) by mouth at bedtime. 05/02/23   [name], NP      Allergies    Patient has no known allergies.    Review of Systems   Review of Systems  Physical Exam Updated Vital Signs BP (!) 144/97   Pulse  84   Resp 10   SpO2 97%  Physical Exam  ED Results / Procedures / Treatments   Labs (all labs ordered are listed, but only abnormal results are displayed) Labs Reviewed  BASIC METABOLIC PANEL - Abnormal; Notable for the following components:      Result Value   Glucose, Bld 177 (*)    All other components within normal limits  CBC  BRAIN NATRIURETIC PEPTIDE  TROPONIN I (HIGH SENSITIVITY)  TROPONIN I (HIGH SENSITIVITY)    EKG EKG Interpretation Date/Time:  Saturday June 17 2023 11:42:52 EDT Ventricular Rate:  87 PR Interval:    QRS Duration:  90 QT Interval:  370 QTC Calculation: 445 R Axis:   27  Text Interpretation: Atrial flutter with variable A-V block Abnormal ECG When compared with ECG of 29-Oct-2022 20:27, No significant change since last tracing Confirmed by Alvira Monday ([id number]) on 06/17/2023 12:13:27 PM  Radiology No results found.  Procedures Procedures  {Document cardiac monitor, telemetry assessment procedure when appropriate:1}  Medications Ordered in ED Medications  ipratropium-albuterol (DUONEB) 0.5-2.5 (3) MG/3ML nebulizer solution 3 mL (3 mLs Nebulization Given 06/17/23 1235)    ED Course/ Medical Decision Making/ A&P   {   Click here for ABCD2, HEART and other calculatorsREFRESH Note before signing :1}                              Medical Decision Making Amount and/or Complexity of Data Reviewed Labs: ordered. Radiology: ordered.  Risk Prescription drug management.   ***  {Document critical care time when appropriate:1} {Document review of labs and clinical decision tools ie heart score, Chads2Vasc2 etc:1}  {Document your independent review of radiology images, and any outside records:1} {Document your discussion with family members, caretakers, and with consultants:1} {Document social determinants of health affecting pt's care:1} {Document your decision making why or why not admission, treatments were needed:1} Final Clinical  Impression(s) / ED Diagnoses Final diagnoses:  None    Rx / DC Orders ED Discharge Orders     None

## 2023-06-19 ENCOUNTER — Other Ambulatory Visit: Payer: Self-pay

## 2023-06-20 ENCOUNTER — Other Ambulatory Visit: Payer: Self-pay

## 2023-06-20 ENCOUNTER — Other Ambulatory Visit (HOSPITAL_BASED_OUTPATIENT_CLINIC_OR_DEPARTMENT_OTHER): Payer: Self-pay

## 2023-06-21 ENCOUNTER — Other Ambulatory Visit (HOSPITAL_BASED_OUTPATIENT_CLINIC_OR_DEPARTMENT_OTHER): Payer: Self-pay

## 2023-06-21 ENCOUNTER — Other Ambulatory Visit: Payer: Self-pay | Admitting: Nurse Practitioner

## 2023-06-21 DIAGNOSIS — M2550 Pain in unspecified joint: Secondary | ICD-10-CM

## 2023-06-22 NOTE — Telephone Encounter (Signed)
Last apt 02/14/23.

## 2023-06-23 ENCOUNTER — Other Ambulatory Visit (HOSPITAL_BASED_OUTPATIENT_CLINIC_OR_DEPARTMENT_OTHER): Payer: Self-pay

## 2023-06-23 MED ORDER — HYDROCODONE-ACETAMINOPHEN 5-325 MG PO TABS
1.0000 | ORAL_TABLET | Freq: Two times a day (BID) | ORAL | 0 refills | Status: DC | PRN
  Filled 2023-06-23: qty 120, 30d supply, fill #0

## 2023-06-28 ENCOUNTER — Other Ambulatory Visit (HOSPITAL_BASED_OUTPATIENT_CLINIC_OR_DEPARTMENT_OTHER): Payer: Self-pay

## 2023-06-28 ENCOUNTER — Other Ambulatory Visit: Payer: Self-pay | Admitting: Nurse Practitioner

## 2023-06-28 ENCOUNTER — Other Ambulatory Visit: Payer: Self-pay

## 2023-06-28 DIAGNOSIS — N471 Phimosis: Secondary | ICD-10-CM

## 2023-06-28 DIAGNOSIS — M2550 Pain in unspecified joint: Secondary | ICD-10-CM

## 2023-06-28 MED ORDER — SILDENAFIL CITRATE 100 MG PO TABS
100.0000 mg | ORAL_TABLET | ORAL | 1 refills | Status: DC
  Filled 2023-06-28: qty 8, 30d supply, fill #0
  Filled 2023-07-29: qty 8, 30d supply, fill #1

## 2023-06-28 MED ORDER — TIZANIDINE HCL 4 MG PO TABS
4.0000 mg | ORAL_TABLET | Freq: Two times a day (BID) | ORAL | 0 refills | Status: DC | PRN
  Filled 2023-06-28 (×2): qty 60, 30d supply, fill #0

## 2023-06-28 NOTE — Telephone Encounter (Signed)
Last apt 04/10/23

## 2023-06-29 ENCOUNTER — Encounter: Payer: Medicare Other | Attending: Physical Medicine & Rehabilitation | Admitting: Physical Medicine & Rehabilitation

## 2023-06-29 ENCOUNTER — Encounter: Payer: Self-pay | Admitting: Physical Medicine & Rehabilitation

## 2023-06-29 VITALS — BP 119/74 | HR 75 | Ht 68.0 in | Wt 224.2 lb

## 2023-06-29 DIAGNOSIS — M1611 Unilateral primary osteoarthritis, right hip: Secondary | ICD-10-CM

## 2023-06-29 NOTE — Progress Notes (Signed)
Subjective:    Patient ID: Shaun [name], male    DOB: 03-Dec-1955, 67 y.o.   MRN: [id number]  HPI 67 year old male with A-fib and coronary artery disease who his primary complaint today is recurrent right sided groin and lateral hip pain.  He has a history of severe osteoarthritis of the right hip with limited range of motion.  The patient was wondering whether he should continue taking the injections or have surgery. Discussed pros/cons of injections of Right hip vs Ortho referral for THA.  He does have risk factors for surgery including atrial fibrillation on Eliquis.  We discussed that he may have a stroke if he comes off of this.  We also discussed that he has coronary artery disease.  He would need to see cardiology for preoperative clearance.  RIght hip injection last performed on 4/30 /24 Wore off 6-8 weeks ago.  When his hip injection wears off his back starts bothering him as well.  His primary care physician is prescribing hydrocodone 5 mg 4 times daily. Pain Inventory Average Pain 7 Pain Right Now 6 My pain is sharp and stabbing  In the last 24 hours, has pain interfered with the following? General activity 6 Relation with others 6 Enjoyment of life 6 What TIME of day is your pain at its worst? morning , daytime, evening, and night Sleep (in general) Fair  Pain is worse with: walking, bending, sitting, inactivity, standing, and some activites Pain improves with: rest and medication Relief from Meds: 2  Family History  Problem Relation Age of Onset   Hypertension Mother    Breast cancer Mother 58       brain/bone    Heart attack Father    Skin cancer Sister 7   Diabetes Brother    Parkinson's disease Brother    Brain cancer Maternal Uncle    Cancer Maternal Uncle        NOS   Breast cancer Cousin        maternal cousin dx <50   Cancer Cousin    Colon cancer Neg Hx    Social History   Socioeconomic History   Marital status: Married    Spouse name:  Shaun [name] Son   Number of children: 5   Years of education: 12   Highest education level: 12th grade  Occupational History   Occupation: employed    Associate Professor: Programme researcher, broadcasting/film/video    Comment: full-time  Tobacco Use   Smoking status: Some Days    Current packs/day: 0.00    Average packs/day: 0.5 packs/day for 51.0 years (25.5 ttl pk-yrs)    Types: Cigarettes    Start date: 07/24/1970    Last attempt to quit: 08/06/2021    Years since quitting: 1.8    Passive exposure: Past   Smokeless tobacco: Never  Vaping Use   Vaping status: Never Used  Substance and Sexual Activity   Alcohol use: No    Alcohol/week: 0.0 standard drinks of alcohol   Drug use: Yes    Types: Hydrocodone   Sexual activity: Yes    Partners: Female  Other Topics Concern   Not on file  Social History Narrative   Not on file   Social Determinants of Health   Financial Resource Strain: Low Risk  (05/09/2023)   Overall Financial Resource Strain (CARDIA)    Difficulty of Paying Living Expenses: Not hard at all  Recent Concern: Financial Resource Strain - Medium Risk (02/14/2023)   Overall Financial Resource Strain (CARDIA)  Difficulty of Paying Living Expenses: Somewhat hard  Food Insecurity: No Food Insecurity (05/09/2023)   Hunger Vital Sign    Worried About Running Out of Food in the Last Year: Never true    Ran Out of Food in the Last Year: Never true  Recent Concern: Food Insecurity - Food Insecurity Present (02/14/2023)   Hunger Vital Sign    Worried About Running Out of Food in the Last Year: Sometimes true    Ran Out of Food in the Last Year: Never true  Transportation Needs: No Transportation Needs (05/09/2023)   PRAPARE - Administrator, Civil Service (Medical): No    Lack of Transportation (Non-Medical): No  Physical Activity: Sufficiently Active (05/09/2023)   Exercise Vital Sign    Days of Exercise per Week: 4 days    Minutes of Exercise per Session: 40 min  Stress: No Stress Concern Present  (05/09/2023)   Shaun [name] of Occupational Health - Occupational Stress Questionnaire    Feeling of Stress : Only a little  Recent Concern: Stress - Stress Concern Present (02/14/2023)   Shaun [name] of Occupational Health - Occupational Stress Questionnaire    Feeling of Stress : Rather much  Social Connections: Moderately Isolated (05/09/2023)   Social Connection and Isolation Panel [NHANES]    Frequency of Communication with Friends and Family: Three times a week    Frequency of Social Gatherings with Friends and Family: Once a week    Attends Religious Services: Never    Database administrator or Organizations: No    Attends Banker Meetings: Never    Marital Status: Married   Past Surgical History:  Procedure Laterality Date   BIOPSY  07/17/2018   Procedure: BIOPSY;  Surgeon: West Bali, MD;  Location: [facility];  Service: Endoscopy;;  colon   BOWEL RESECTION  09/17/2018   SMALL BOWEL RESECTION: 71 CM    CARDIAC CATHETERIZATION N/A 07/21/2015   Procedure: Left Heart Cath and Coronary Angiography;  Surgeon: Peter M Swaziland, MD;  Location: [facility] INVASIVE CV LAB;  Service: Cardiovascular;  Laterality: N/A;   CARDIAC CATHETERIZATION N/A 07/21/2015   Procedure: Coronary Stent Intervention;  Surgeon: Peter M Swaziland, MD;  Location: [facility] INVASIVE CV LAB;  Service: Cardiovascular;  Laterality: N/A;   CIRCUMCISION N/A 04/05/2019   Procedure: CIRCUMCISION ADULT;  Surgeon: Bjorn Pippin, MD;  Location: [facility] OR[facility];  Service: Urology;  Laterality: N/A;   COLONOSCOPY N/A 05/19/2014   Dr. Cyndi Bender diverticulosis/moderate external hemorrhoids, >20 simple adenomas. Genetic screening negative.    COLONOSCOPY WITH PROPOFOL N/A 07/17/2018   Dr. Darrick Penna: Diverticulosis, external/internal hemorrhoids, 32 colon polyps removed.  ten tubular adenomas removed with no high-grade dysplasia.  Advised to have surveillance colonoscopy in 3 years.   COLONOSCOPY WITH PROPOFOL N/A 06/07/2021    Procedure: COLONOSCOPY WITH PROPOFOL;  Surgeon: Lanelle Bal, DO;  [facility]: [facility];  Service: Endoscopy;  Laterality: N/A;  9:30 / ASA 3  (Pt was told that his time will be given at Pre-op)   CORONARY PRESSURE/FFR STUDY Left 06/08/2017   Procedure: INTRAVASCULAR PRESSURE WIRE/FFR STUDY;  Surgeon: Yvonne Kendall, MD;  Location: [facility] IN[facility] CV LAB;  Service: Cardiovascular;  Laterality: Left;  LAD and CFX   ESOPHAGOGASTRODUODENOSCOPY (EGD) WITH PROPOFOL N/A 07/17/2018   Dr. Darrick Penna: Low-grade narrowing Schatzki ring at the GE junction status post dilation.  Gastritis.  Biopsy with mild nonspecific reactive gastropathy.  No H. pylori.   GIVENS CAPSULE STUDY N/A 06/24/2019  normal   HERNIA REPAIR  1986   Left inguinal   INCISIONAL HERNIA REPAIR N/A 10/19/2022   Procedure: OPEN INCISIONAL HERNIA REPAIR;  Surgeon: Fritzi Mandes, MD;  Location: [facility] OR;  Service: General;  Laterality: N/A;   INSERTION OF MESH N/A 10/19/2022   Procedure: INSERTION OF MESH;  Surgeon: Fritzi Mandes, MD;  Location: [facility] OR;  Service: General;  Laterality: N/A;   LAPAROTOMY N/A 09/17/2018   Procedure: EXPLORATORY LAPAROTOMY;  Surgeon: Lucretia Roers, MD;  Location: [facility];  Service: General;  Laterality: N/A;   LEFT HEART CATH AND CORONARY ANGIOGRAPHY N/A 06/08/2017   Procedure: LEFT HEART CATH AND CORONARY ANGIOGRAPHY;  Surgeon: Yvonne Kendall, MD;  [facility]: [facility];  Service: Cardiovascular;  Laterality: N/A;   POLYPECTOMY  07/17/2018   Procedure: POLYPECTOMY;  Surgeon: West Bali, MD;  [facility];  Service: Endoscopy;;  colon   POLYPECTOMY  06/07/2021   Procedure: POLYPECTOMY INTESTINAL;  Surgeon: Lanelle Bal, DO;  [facility];  Service: Endoscopy;;   ROTATOR CUFF REPAIR     Right   SAVORY DILATION N/A 07/17/2018   Procedure: SAVORY DILATION;  Surgeon: West Bali, MD;  [facility];  Service: Endoscopy;  Laterality: N/A;   Past  Surgical History:  Procedure Laterality Date   BIOPSY  07/17/2018   Procedure: BIOPSY;  Surgeon: West Bali, MD;  [facility];  Service: Endoscopy;;  colon   BOWEL RESECTION  09/17/2018   SMALL BOWEL RESECTION: 71 CM    CARDIAC CATHETERIZATION N/A 07/21/2015   Procedure: Left Heart Cath and Coronary Angiography;  Surgeon: Peter M Swaziland, MD;  Location: [facility] INVASIVE CV LAB;  Service: Cardiovascular;  Laterality: N/A;   CARDIAC CATHETERIZATION N/A 07/21/2015   Procedure: Coronary Stent Intervention;  Surgeon: Peter M Swaziland, MD;  Location: [facility] INVASIVE CV LAB;  Service: Cardiovascular;  Laterality: N/A;   CIRCUMCISION N/A 04/05/2019   Procedure: CIRCUMCISION ADULT;  Surgeon: Bjorn Pippin, MD;  Location: [facility];  Service: Urology;  Laterality: N/A;   COLONOSCOPY N/A 05/19/2014   Dr. Cyndi Bender diverticulosis/moderate external hemorrhoids, >20 simple adenomas. Genetic screening negative.    COLONOSCOPY WITH PROPOFOL N/A 07/17/2018   Dr. Darrick Penna: Diverticulosis, external/internal hemorrhoids, 32 colon polyps removed.  ten tubular adenomas removed with no high-grade dysplasia.  Advised to have surveillance colonoscopy in 3 years.   COLONOSCOPY WITH PROPOFOL N/A 06/07/2021   Procedure: COLONOSCOPY WITH PROPOFOL;  Surgeon: Lanelle Bal, DO;  [facility];  Service: Endoscopy;  Laterality: N/A;  9:30 / ASA 3  (Pt was told that his time will be given at Pre-op)   CORONARY PRESSURE/FFR STUDY Left 06/08/2017   Procedure: INTRAVASCULAR PRESSURE WIRE/FFR STUDY;  Surgeon: Yvonne Kendall, MD;  [facility];  Service: Cardiovascular;  Laterality: Left;  LAD and CFX   ESOPHAGOGASTRODUODENOSCOPY (EGD) WITH PROPOFOL N/A 07/17/2018   Dr. Darrick Penna: Low-grade narrowing Schatzki ring at the GE junction status post dilation.  Gastritis.  Biopsy with mild nonspecific reactive gastropathy.  No H. pylori.   GIVENS CAPSULE STUDY N/A 06/24/2019   normal   HERNIA REPAIR  1986    Left inguinal   INCISIONAL HERNIA REPAIR N/A 10/19/2022   Procedure: OPEN INCISIONAL HERNIA REPAIR;  Surgeon: Fritzi Mandes, MD;  Location[facility] OR;  Service: General;  Laterality: N/A;   INSERTION OF MESH N/A 10/19/2022   Procedure: INSERTION OF MESH;  Surgeon: Fritzi Mandes, MD;  Location[facility];  Service: General;  Laterality: N/A;   LAPAROTOMY N/A 09/17/2018   Procedure: EXPLORATORY LAPAROTOMY;  Surgeon: Lucretia Roers, MD;  Location: [facility];  Service: General;  Laterality: N/A;   LEFT HEART CATH AND CORONARY ANGIOGRAPHY N/A 06/08/2017   Procedure: LEFT HEART CATH AND CORONARY ANGIOGRAPHY;  Surgeon: Yvonne Kendall, MD;  [facility];  Service: Cardiovascular;  Laterality: N/A;   POLYPECTOMY  07/17/2018   Procedure: POLYPECTOMY;  Surgeon: West Bali, MD;  [facility];  Service: Endoscopy;;  colon   POLYPECTOMY  06/07/2021   Procedure: POLYPECTOMY INTESTINAL;  Surgeon: Lanelle Bal, DO;  [facility];  Service: Endoscopy;;   ROTATOR CUFF REPAIR     Right   SAVORY DILATION N/A 07/17/2018   Procedure: SAVORY DILATION;  Surgeon: West Bali, MD;  [facility];  Service: Endoscopy;  Laterality: N/A;   Past Medical History:  Diagnosis Date   A-fib (HCC)    Anemia    Anginal pain (HCC)    Anxiety    Asthma    Bacterial URI 08/29/2022   CAD (coronary artery disease)    DES to distal circumflex 2016   Cataract    CHF (congestive heart failure) (HCC)    Colon polyps    30 colon polyps found on first colonoscopy   Depression    Diastolic heart failure (HCC)    Diverticulitis    DJD (degenerative joint disease)    Dyspnea    Dysrhythmia    A-Fib   Genetic testing 09/09/2014   Negative genetic testing on the ColoNext panel test and the MSH2 inversion testing.  The ColoNext gene panel offered by [facility] and includes sequencing and rearrangement analysis for the following 17 genes: APC, BMPR1A, CDH1, CHEK2, EPCAM,  GREM1, MLH1, MSH2, MSH6, MUTYH, PMS2, POLD1, POLE, PTEN, SMAD4, STK11, and TP53.   The report date is 09/08/14.      GERD (gastroesophageal reflux disease)    History of kidney stones    Hyperlipidemia    Hypernatremia 08/22/2021   Hypertension    Insomnia    Obstructive sleep apnea 12/2009   01/26/2010 AHI 83/hr   Permanent atrial fibrillation (HCC)    Onset 2006 paroxysmal then progressive to persistent   Pneumonia    PUD (peptic ulcer disease)    1980s   RLS (restless legs syndrome)    Sinusitis    Skin cancer    Type 2 diabetes mellitus (HCC)    BP 119/74   Pulse 75   Ht 5\' 8"  (1.727 m)   Wt 224 lb 3.2 oz (101.7 kg)   SpO2 94%   BMI 34.09 kg/m   Opioid Risk Score:   Fall Risk Score:  `1  Depression screen [facility] 2/9     06/29/2023    9:32 AM 05/09/2023    9:21 AM 12/27/2022    1:24 PM 11/07/2022    9:33 AM 05/31/2022    1:08 PM 04/19/2022   10:17 AM 03/22/2022    8:27 AM  Depression screen PHQ 2/9  Decreased Interest 1 1 0 0 0 0 2  Down, Depressed, Hopeless 1 1 0 0 0 1 2  PHQ - 2 Score 2 2 0 0 0 1 4  Altered sleeping  2   0 3 1  Tired, decreased energy  1   0 1 2  Change in appetite  0   0 2 1  Feeling bad or  failure about yourself   1   0 1 2  Trouble concentrating  0   0 2 1  Moving slowly or fidgety/restless  0   0 0 0  Suicidal thoughts  0   0 0 1  PHQ-9 Score  6   0 10 12  Difficult doing work/chores  Somewhat difficult    Somewhat difficult Not difficult at all     Review of Systems  Constitutional: Negative.   HENT: Negative.    Eyes: Negative.   Respiratory: Negative.    Cardiovascular: Negative.   Gastrointestinal: Negative.   Endocrine: Negative.   Genitourinary: Negative.   Musculoskeletal:  Positive for back pain and gait problem.  Skin: Negative.   Allergic/Immunologic: Negative.   Hematological:  Bruises/bleeds easily.       Eliquis  Psychiatric/Behavioral:  Positive for dysphoric mood.   All other systems reviewed and are negative.       Objective:   Physical Exam General No acute distress Mood and affect are appropriate Extremities without edema Right lower extremity has limited internal/external rotation at the hip.  Patient has pain with range of motion as well external greater than internal.  No knee effusion no pain with knee range of motion.  There is mild tenderness palpation over the greater trochanter of the hip on the right side. Patient ambulates with a straight cane with antalgic gait.       Assessment & Plan:  1.  History of right hip OA with recurrent right hip pain.  We discussed that in order to see whether hip arthroplasty is an option for him he would first need to see his cardiologist.  If okayed for surgery then would need to be referred to Ortho.  We discussed referral to Ortho care as a possibility. In the meantime we will schedule 4-week right hip injection under fluoroscopic guidance. PCP prescribing narcotic analgesics.

## 2023-06-30 ENCOUNTER — Other Ambulatory Visit: Payer: Self-pay

## 2023-06-30 DIAGNOSIS — F17210 Nicotine dependence, cigarettes, uncomplicated: Secondary | ICD-10-CM

## 2023-06-30 DIAGNOSIS — Z87891 Personal history of nicotine dependence: Secondary | ICD-10-CM

## 2023-06-30 DIAGNOSIS — Z122 Encounter for screening for malignant neoplasm of respiratory organs: Secondary | ICD-10-CM

## 2023-07-09 NOTE — Progress Notes (Unsigned)
Office Visit    Patient Name: Shaun [name] Date of Encounter: 07/10/2023  Primary Care Provider:  [name], NP Primary Cardiologist:  [name], MD  Chief Complaint    67 y.o. male with history of permanent atrial fibrillation, OSA, hyperlipidemia, coronary artery disease, chronic diastolic congestive heart failure, COPD, type 2 diabetes, and hypertension.  Past Medical History  Subjective   Past Medical History:  Diagnosis Date   A-fib (HCC)    Anemia    Anginal pain (HCC)    Anxiety    Asthma    Bacterial URI 08/29/2022   CAD (coronary artery disease)    DES to distal circumflex 2016   Cataract    CHF (congestive heart failure) (HCC)    Colon polyps    30 colon polyps found on first colonoscopy   Depression    Diastolic heart failure (HCC)    Diverticulitis    DJD (degenerative joint disease)    Dyspnea    Dysrhythmia    A-Fib   Genetic testing 09/09/2014   Negative genetic testing on the ColoNext panel test and the MSH2 inversion testing.  The ColoNext gene panel offered by [facility] and includes sequencing and rearrangement analysis for the following 17 genes: APC, BMPR1A, CDH1, CHEK2, EPCAM, GREM1, MLH1, MSH2, MSH6, MUTYH, PMS2, POLD1, POLE, PTEN, SMAD4, STK11, and TP53.   The report date is 09/08/14.      GERD (gastroesophageal reflux disease)    History of kidney stones    Hyperlipidemia    Hypernatremia 08/22/2021   Hypertension    Insomnia    Obstructive sleep apnea 12/2009   01/26/2010 AHI 83/hr   Permanent atrial fibrillation (HCC)    Onset 2006 paroxysmal then progressive to persistent   Pneumonia    PUD (peptic ulcer disease)    1980s   RLS (restless legs syndrome)    Sinusitis    Skin cancer    Type 2 diabetes mellitus (HCC)    Past Surgical History:  Procedure Laterality Date   BIOPSY  07/17/2018   Procedure: BIOPSY;  Surgeon: West Bali, MD;  [facility];  Service: Endoscopy;;  colon   BOWEL  RESECTION  09/17/2018   SMALL BOWEL RESECTION: 71 CM    CARDIAC CATHETERIZATION N/A 07/21/2015   Procedure: Left Heart Cath and Coronary Angiography;  Surgeon: Peter M Swaziland, MD;  Location: [facility] INVASIVE CV LAB;  Service: Cardiovascular;  Laterality: N/A;   CARDIAC CATHETERIZATION N/A 07/21/2015   Procedure: Coronary Stent Intervention;  Surgeon: Peter M Swaziland, MD;  Location: [facility] INVASIVE CV LAB;  Service: Cardiovascular;  Laterality: N/A;   CIRCUMCISION N/A 04/05/2019   Procedure: CIRCUMCISION ADULT;  Surgeon: Bjorn Pippin, MD;  Location: [facility] OR[facility];  Service: Urology;  Laterality: N/A;   COLONOSCOPY N/A 05/19/2014   Dr. Cyndi Bender diverticulosis/moderate external hemorrhoids, >20 simple adenomas. Genetic screening negative.    COLONOSCOPY WITH PROPOFOL N/A 07/17/2018   Dr. Darrick Penna: Diverticulosis, external/internal hemorrhoids, 32 colon polyps removed.  ten tubular adenomas removed with no high-grade dysplasia.  Advised to have surveillance colonoscopy in 3 years.   COLONOSCOPY WITH PROPOFOL N/A 06/07/2021   Procedure: COLONOSCOPY WITH PROPOFOL;  Surgeon: Lanelle Bal, DO;  [facility]: [facility];  Service: Endoscopy;  Laterality: N/A;  9:30 / ASA 3  (Pt was told that his time will be given at Pre-op)   CORONARY PRESSURE/FFR STUDY Left 06/08/2017   Procedure: INTRAVASCULAR PRESSURE WIRE/FFR STUDY;  Surgeon: Yvonne Kendall, MD;  Location:  [facility] INVASIVE CV LAB;  Service: Cardiovascular;  Laterality: Left;  LAD and CFX   ESOPHAGOGASTRODUODENOSCOPY (EGD) WITH PROPOFOL N/A 07/17/2018   Dr. Darrick Penna: Low-grade narrowing Schatzki ring at the GE junction status post dilation.  Gastritis.  Biopsy with mild nonspecific reactive gastropathy.  No H. pylori.   GIVENS CAPSULE STUDY N/A 06/24/2019   normal   HERNIA REPAIR  1986   Left inguinal   INCISIONAL HERNIA REPAIR N/A 10/19/2022   Procedure: OPEN INCISIONAL HERNIA REPAIR;  Surgeon: Fritzi Mandes, MD;  Location: [facility] OR;  Service: General;  Laterality: N/A;    INSERTION OF MESH N/A 10/19/2022   Procedure: INSERTION OF MESH;  Surgeon: Fritzi Mandes, MD;  Location: [facility] OR;  Service: General;  Laterality: N/A;   LAPAROTOMY N/A 09/17/2018   Procedure: EXPLORATORY LAPAROTOMY;  Surgeon: Lucretia Roers, MD;  Location: [facility];  Service: General;  Laterality: N/A;   LEFT HEART CATH AND CORONARY ANGIOGRAPHY N/A 06/08/2017   Procedure: LEFT HEART CATH AND CORONARY ANGIOGRAPHY;  Surgeon: Yvonne Kendall, MD;  Location: [facility] INVASIVE CV LAB;  Service: Cardiovascular;  Laterality: N/A;   POLYPECTOMY  07/17/2018   Procedure: POLYPECTOMY;  Surgeon: West Bali, MD;  [facility];  Service: Endoscopy;;  colon   POLYPECTOMY  06/07/2021   Procedure: POLYPECTOMY INTESTINAL;  Surgeon: Lanelle Bal, DO;  [facility];  Service: Endoscopy;;   ROTATOR CUFF REPAIR     Right   SAVORY DILATION N/A 07/17/2018   Procedure: SAVORY DILATION;  Surgeon: West Bali, MD;  [facility]: [facility];  Service: Endoscopy;  Laterality: N/A;    Allergies  No Known Allergies    History of Present Illness      67 y.o. y/o male with history of permanent atrial fibrillation, OSA, hyperlipidemia, coronary artery disease, chronic diastolic congestive heart failure, COPD, type 2 diabetes, and hypertension.  He is s/p PCI to LCx in November 2016.  In June 2018 he was seen by afib clinic and given his longstanding atrial fibrillation and left atrial enlargement it was felt that rate control and anticoagulation would be his goal.  LHC on 05/2017 showed distal Cx lesion as 30% stenosed, patent stent in distal LCx.  Echo 05/2017 with LVEF 60 to 65%, no RWMA, moderate LVH, LV function normal, with atrium severely dilated.  Monitor 04/18/2019 showed atrial fibrillation with PVCs or aberrantly conducted beats, rate controlled.  Abdominal CT July 2022 showed no aneurysm.  Echo 08/17/2021 showed normal LV function, moderate LVH, mild left atrial enlargement.   He  was seen in the ED on 06/17/2023 with complaints of trouble breathing and chest pain described as squeezing/tightness.  He did have URI symptoms.  COPD exacerbation was suspected he received DuoNebs and Solu-Medrol.  He was prescribed prednisone, azithromycin, albuterol. He did note his chest tightness reminded him of the symptoms he had prior to stenting in the past. Troponin 3, 3. Chest x-ray unremarkable, BNP 62.7.   Today patient notes he is doing well overall since last ED visit.  He notes that his URI symptoms have improved.  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  The episode lasted less than 2 minutes and was described as sharp, he did not take a nitroglycerin.  The pain was relieved by rest.  He notes abdominal distention and 10 pound weight gain over the past 6 weeks.  He notes that he does sleep with his head of bed elevated  at night and is compliant with his CPAP but notes worsening SOB while laying flat.  He denies lower extremity edema, fatigue, palpitations, melena, hematuria, hemoptysis, diaphoresis, weakness, presyncope, syncope.   Objective  Home Medications    Current Outpatient Medications  Medication Sig Dispense Refill   albuterol (PROVENTIL) (2.5 MG/3ML) 0.083% nebulizer solution Take 3 mLs (2.5 mg total) by nebulization every 6 (six) hours as needed for wheezing or shortness of breath. 75 mL 12   albuterol (VENTOLIN HFA) 108 (90 Base) MCG/ACT inhaler INHALE 2 PUFFS INTO THE LUNGS EVERY 6 HOURS AS NEEDED FOR WHEEZING OR SHORTNESS OF BREATH 6.7 g 1   apixaban (ELIQUIS) 5 MG TABS tablet Take 1 tablet (5 mg total) by mouth 2 (two) times daily. 180 tablet 1   atorvastatin (LIPITOR) 80 MG tablet Take 1 tablet (80 mg total) by mouth every evening. 90 tablet 2   azelastine (ASTELIN) 0.1 % nasal spray Place 1 spray into both nostrils 2 (two) times daily. Use in each nostril as directed 30 mL 12   buPROPion (WELLBUTRIN XL) 150 MG 24 hr tablet Take 3  tablets once daily (Patient taking differently: Take 150 mg by mouth 3 (three) times daily.) 270 tablet 3   clindamycin (CLEOCIN T) 1 % lotion Apply topically daily.     docusate sodium (COLACE) 100 MG capsule Take 1 capsule (100 mg total) by mouth daily as needed for mild constipation. (Patient taking differently: Take 100 mg by mouth daily.) 100 capsule 3   empagliflozin (JARDIANCE) 10 MG TABS tablet Take 1 tablet (10 mg total) by mouth daily before breakfast. 60 tablet 1   escitalopram (LEXAPRO) 20 MG tablet      ezetimibe (ZETIA) 10 MG tablet Take 1 tablet (10 mg total) by mouth daily. 90 tablet 3   ferrous sulfate 325 (65 FE) MG tablet Take 1 tablet (325 mg total) by mouth 3 (three) times a week. 30 tablet 1   furosemide (LASIX) 40 MG tablet Take 1 tablet (40mg ) daily. May take 1/2 tablet (20mg ) additional if shortness of breath or swelling occurs. 120 tablet 2   HYDROcodone-acetaminophen (NORCO/VICODIN) 5-325 MG tablet Take 1-2 tablets by mouth 2 (two) times daily as needed for moderate pain (pain score 4-6) or severe pain (pain score 7-10). 120 tablet 0   hydrOXYzine (ATARAX) 25 MG tablet Take 1 tablet (25 mg total) by mouth at bedtime and may repeat dose one time if needed. For sleep. 60 tablet 2   metFORMIN (GLUCOPHAGE) 500 MG tablet Take 1 tablet (500 mg total) by mouth 2 (two) times daily before a meal. 180 tablet 3   methimazole (TAPAZOLE) 5 MG tablet Take 1 tablet (5 mg total) by mouth 4 days a week. 52 tablet 1   Metoprolol Tartrate 75 MG TABS Take 75 mg by mouth 2 (two) times daily. 120 tablet 3   nitroGLYCERIN (NITROSTAT) 0.4 MG SL tablet Place 1 tablet (0.4 mg total) under the tongue every 5 (five) minutes as needed. 25 tablet 3   omeprazole (PRILOSEC) 40 MG capsule Take 1 capsule by mouth once daily 30 minutes before breakfast 90 capsule 3   polyethylene glycol (MIRALAX / GLYCOLAX) 17 g packet Take 17 g by mouth daily as needed for mild constipation. 14 each 0   potassium chloride  SA (KLOR-CON M) 20 MEQ tablet Take 1 tablet (20 mEq total) by mouth daily. 90 tablet 3   rOPINIRole (REQUIP) 3 MG tablet Take 1 tablet by mouth nightly for restless leg,  and a 1/2 tab during the day to control breakthrough symptoms 45 tablet 2   sildenafil (VIAGRA) 100 MG tablet Take 1 tablet (100 mg total) by mouth 30 minutes before activity 8 tablet 1   sucralfate (CARAFATE) 1 g tablet Take one tablet po BID PRN 42 tablet 5   tiZANidine (ZANAFLEX) 4 MG tablet Take 1 tablet (4 mg total) by mouth 2 (two) times daily as needed for muscle spasms. 60 tablet 0   traZODone (DESYREL) 150 MG tablet Take 1 tablet (150 mg total) by mouth at bedtime. 90 tablet 1   Current Facility-Administered Medications  Medication Dose Route Frequency Provider Last Rate Last Admin   lidocaine (XYLOCAINE) 1 % (with pres) injection 5 mL  5 mL Other Once Kirsteins, Victorino Sparrow, MD         Physical Exam    VS:  BP 122/76 (BP Location: Left Arm, Patient Position: Sitting, Cuff Size: Large)   Pulse 92   Ht 5\' 8"  (1.727 m)   Wt 224 lb 6.4 oz (101.8 kg)   SpO2 94%   BMI 34.12 kg/m  , BMI Body mass index is 34.12 kg/m.       GEN: Well nourished, well developed, in no acute distress. HEENT: normal. Neck: Supple, no JVD, carotid bruits, or masses. Cardiac: RRR, no murmurs, rubs, or gallops. No clubbing, cyanosis, edema.  Radials 2+/PT 2+ and equal bilaterally.  Respiratory:  Respirations regular and unlabored, clear to auscultation bilaterally. GI: Soft, nontender, distended, BS + x 4. MS: no deformity or atrophy. Skin: warm and dry, no rash. Neuro:  Strength and sensation are intact. Psych: Normal affect.  Accessory Clinical Findings    ECG personally reviewed by me today - Atrial Fibrillation 92bmp - no acute changes.  Lab Results  Component Value Date   WBC 10.0 06/17/2023   HGB 14.4 06/17/2023   HCT 42.8 06/17/2023   MCV 82.8 06/17/2023   PLT 163 06/17/2023   Lab Results  Component Value Date    CREATININE 1.11 06/17/2023   BUN 18 06/17/2023   NA 137 06/17/2023   K 4.1 06/17/2023   CL 103 06/17/2023   CO2 23 06/17/2023   Lab Results  Component Value Date   ALT 25 10/31/2022   AST 21 10/31/2022   ALKPHOS 81 10/31/2022   BILITOT 1.0 10/31/2022   Lab Results  Component Value Date   CHOL 129 04/19/2022   HDL 29.90 (L) 04/19/2022   LDLCALC 92 02/10/2021   LDLDIRECT 83.0 04/19/2022   TRIG 255.0 (H) 04/19/2022   CHOLHDL 4 04/19/2022    Lab Results  Component Value Date   HGBA1C 6.8 (H) 02/14/2023   Lab Results  Component Value Date   TSH 0.84 03/31/2023       Assessment & Plan    1.  Coronary Artery Disease / Chest Pain -S/p DES-dLCx in 2016 -Two episodes of exertional angina within the past month -Plan for PET stress -No ASA due to chronic anticoagulation  2. Permanent Atrial Fibrillation -CHA2DS2-VASc Score = 5 [CHF History: 1, HTN History: 1, Diabetes History: 1, Stroke History: 0, Vascular Disease History: 1, Age Score: 1, Gender Score: 0].  Therefore, the patient's annual risk of stroke is 7.2 % -EKG today rate controlled A-fib -Denies bleeding complications, no falls -Does not meet dose reduction criteria -Continue metoprolol 75 mg 2 times daily, Eliquis 5 mg 2 times daily  3. Chronic Diastolic Congestive HF  -Echo 46/96/2952 LVEF 60 to 65%, no RWMA, mild  LVH, RV SF normal -He notes ongoing SOB, DOE and worsening orthopnea -10 pound unintentional weight gain over the past 6 weeks -Will order echocardiogram to reassess EF -Will add Jardiance 10 mg once daily  4.  Hypertension -BP today 122/76 -Well controlled, continue metoprolol 75 mg twice daily  5. Hyperlipidemia, goal <70 -Direct LDL 04/19/2022 was 83 -Repeat ordered 02/2023, but has not made it back to lab -Printed off lab sheet and he will come fasting for lipid panel tomorrow  6. OSA -Reports continued compliance to CPAP  Dispo: Follow up with Wynema Birch, PA in 4-6 weeks  Informed Consent    Shared Decision Making/Informed Consent The risks [chest pain, shortness of breath, cardiac arrhythmias, dizziness, blood pressure fluctuations, myocardial infarction, stroke/transient ischemic attack, nausea, vomiting, allergic reaction, radiation exposure, metallic taste sensation and life-threatening complications (estimated to be 1 in 10,000)], benefits (risk stratification, diagnosing coronary artery disease, treatment guidance) and alternatives of a cardiac PET stress test were discussed in detail with Mr. Glisan and he agrees to proceed.      Denyce Robert, NP 07/10/2023, 10:49 AM

## 2023-07-10 ENCOUNTER — Encounter: Payer: Self-pay | Admitting: Physician Assistant

## 2023-07-10 ENCOUNTER — Other Ambulatory Visit (HOSPITAL_BASED_OUTPATIENT_CLINIC_OR_DEPARTMENT_OTHER): Payer: Self-pay

## 2023-07-10 ENCOUNTER — Ambulatory Visit: Payer: Medicare Other | Attending: Physician Assistant | Admitting: Emergency Medicine

## 2023-07-10 VITALS — BP 122/76 | HR 92 | Ht 68.0 in | Wt 224.4 lb

## 2023-07-10 DIAGNOSIS — I482 Chronic atrial fibrillation, unspecified: Secondary | ICD-10-CM | POA: Diagnosis not present

## 2023-07-10 DIAGNOSIS — I10 Essential (primary) hypertension: Secondary | ICD-10-CM | POA: Diagnosis not present

## 2023-07-10 DIAGNOSIS — R079 Chest pain, unspecified: Secondary | ICD-10-CM

## 2023-07-10 DIAGNOSIS — I25118 Atherosclerotic heart disease of native coronary artery with other forms of angina pectoris: Secondary | ICD-10-CM | POA: Diagnosis not present

## 2023-07-10 DIAGNOSIS — R0602 Shortness of breath: Secondary | ICD-10-CM

## 2023-07-10 DIAGNOSIS — I5032 Chronic diastolic (congestive) heart failure: Secondary | ICD-10-CM | POA: Diagnosis not present

## 2023-07-10 DIAGNOSIS — E785 Hyperlipidemia, unspecified: Secondary | ICD-10-CM

## 2023-07-10 DIAGNOSIS — G4733 Obstructive sleep apnea (adult) (pediatric): Secondary | ICD-10-CM

## 2023-07-10 MED ORDER — EMPAGLIFLOZIN 10 MG PO TABS
10.0000 mg | ORAL_TABLET | Freq: Every day | ORAL | 1 refills | Status: DC
  Filled 2023-07-10 – 2023-07-12 (×2): qty 60, 60d supply, fill #0
  Filled 2023-09-18 – 2023-09-28 (×2): qty 60, 60d supply, fill #1

## 2023-07-10 NOTE — Patient Instructions (Signed)
Medication Instructions:  START JARDIANCE 10 MG DAILY. *If you need a refill on your cardiac medications before your next appointment, please call your pharmacy*   Lab Work: FASTING LABS - TOMORROW If you have labs (blood work) drawn today and your tests are completely normal, you will receive your results only by: MyChart Message (if you have MyChart) OR A paper copy in the mail If you have any lab test that is abnormal or we need to change your treatment, we will call you to review the results.   Testing/Procedures:[address] Your physician has requested that you have an echocardiogram. Echocardiography is a painless test that uses sound waves to create images of your heart. It provides your doctor with information about the size and shape of your heart and how well your heart's chambers and valves are working. This procedure takes approximately one hour. There are no restrictions for this procedure. Please do NOT wear cologne, perfume, aftershave, or lotions (deodorant is allowed). Please arrive 15 minutes prior to your appointment time.  Please note: We ask at that you not bring children with you during ultrasound (echo/ vascular) testing. Due to room size and safety concerns, children are not allowed in the ultrasound rooms during exams. Our front office staff cannot provide observation of children in our lobby area while testing is being conducted. An adult accompanying a patient to their appointment will only be allowed in the ultrasound room at the discretion of the ultrasound technician under special circumstances. We apologize for any inconvenience.    How to Prepare for Your Cardiac PET/CT Stress Test:  1. Please do not take these medications before your test:   Medications that may interfere with the cardiac pharmacological stress agent (ex. nitrates - including erectile dysfunction medications, isosorbide mononitrate- [please start to hold this medication the day  before the test], tamulosin or beta-blockers) the day of the exam. (Erectile dysfunction medication should be held for at least 72 hrs prior to test) Theophylline containing medications for 12 hours. Dipyridamole 48 hours prior to the test. Your remaining medications may be taken with water.  2. Nothing to eat or drink, except water, 3 hours prior to arrival time.   NO caffeine/decaffeinated products, or chocolate 12 hours prior to arrival.  3. NO perfume, cologne or lotion on chest or abdomen area.          - FEMALES - Please avoid wearing dresses to this appointment.  4. Total time is 1 to 2 hours; you may want to bring reading material for the waiting time.  5. Please report to Radiology at the [facility] Main Entrance 30 minutes early for your test.  [name] [place], [place] [address]  In preparation for your appointment, medication and supplies will be purchased.  Appointment availability is limited, so if you need to cancel or reschedule, please call the Radiology Department at [phone] [facility]) 24 hours in advance to avoid a cancellation fee of $100.00  What to Expect After you Arrive:  Once you arrive and check in for your appointment, you will be taken to a preparation room within the Radiology Department.  A technologist or Nurse will obtain your medical history, verify that you are correctly prepped for the exam, and explain the procedure.  Afterwards,  an IV will be started in your arm and electrodes will be placed on your skin for EKG monitoring during the stress portion of the exam. Then you will be  escorted to the PET/CT scanner.  There, staff will get you positioned on the scanner and obtain a blood pressure and EKG.  During the exam, you will continue to be connected to the EKG and blood pressure machines.  A small, safe amount of a radioactive tracer will be injected in your IV to obtain a series of pictures of your heart along with an injection of  a stress agent.    After your Exam:  It is recommended that you eat a meal and drink a caffeinated beverage to counter act any effects of the stress agent.  Drink plenty of fluids for the remainder of the day and urinate frequently for the first couple of hours after the exam.  Your doctor will inform you of your test results within 7-10 business days.  For more information and frequently asked questions, please visit our website : [email or web address]  For questions about your test or how to prepare for your test, please call: Cardiac Imaging Nurse Navigators Office: [phone]   Follow-Up: At [facility], you and your health needs are our priority.  As part of our continuing mission to provide you with exceptional heart care, we have created designated Provider Care Teams.  These Care Teams include your primary Cardiologist (physician) and Advanced Practice Providers (APPs -  Physician Assistants and Nurse Practitioners) who all work together to provide you with the care you need, when you need it.  We recommend signing up for the patient portal called "MyChart".  Sign up information is provided on this After Visit Summary.  MyChart is used to connect with patients for Virtual Visits (Telemedicine).  Patients are able to view lab/test results, encounter notes, upcoming appointments, etc.  Non-urgent messages can be sent to your provider as well.   To learn more about what you can do with MyChart, go to [email or web address].    Your next appointment:   4 week(s)  Provider:   Azalee Course, PA

## 2023-07-11 ENCOUNTER — Other Ambulatory Visit (HOSPITAL_BASED_OUTPATIENT_CLINIC_OR_DEPARTMENT_OTHER): Payer: Self-pay

## 2023-07-12 ENCOUNTER — Other Ambulatory Visit: Payer: Self-pay | Admitting: Internal Medicine

## 2023-07-12 ENCOUNTER — Other Ambulatory Visit (HOSPITAL_BASED_OUTPATIENT_CLINIC_OR_DEPARTMENT_OTHER): Payer: Self-pay

## 2023-07-12 DIAGNOSIS — E0590 Thyrotoxicosis, unspecified without thyrotoxic crisis or storm: Secondary | ICD-10-CM

## 2023-07-12 LAB — LIPID PANEL
Chol/HDL Ratio: 4.6 {ratio} (ref 0.0–5.0)
Cholesterol, Total: 134 mg/dL (ref 100–199)
HDL: 29 mg/dL — ABNORMAL LOW (ref >39)
LDL Chol Calc (NIH): 70 mg/dL (ref 0–99)
Triglycerides: 207 mg/dL — ABNORMAL HIGH (ref 0–149)
VLDL Cholesterol Cal: 35 mg/dL (ref 5–40)

## 2023-07-12 MED ORDER — METHIMAZOLE 5 MG PO TABS
5.0000 mg | ORAL_TABLET | ORAL | 1 refills | Status: DC
  Filled 2023-07-12: qty 52, 90d supply, fill #0

## 2023-07-18 ENCOUNTER — Ambulatory Visit
Admission: RE | Admit: 2023-07-18 | Discharge: 2023-07-18 | Disposition: A | Discharge status: Home / Self Care | Payer: Medicare Other | Source: Ambulatory Visit | Attending: Acute Care | Admitting: Acute Care

## 2023-07-18 DIAGNOSIS — Z122 Encounter for screening for malignant neoplasm of respiratory organs: Secondary | ICD-10-CM

## 2023-07-18 DIAGNOSIS — F17210 Nicotine dependence, cigarettes, uncomplicated: Secondary | ICD-10-CM

## 2023-07-18 DIAGNOSIS — Z87891 Personal history of nicotine dependence: Secondary | ICD-10-CM

## 2023-07-19 ENCOUNTER — Other Ambulatory Visit (HOSPITAL_BASED_OUTPATIENT_CLINIC_OR_DEPARTMENT_OTHER): Payer: Self-pay

## 2023-07-19 ENCOUNTER — Telehealth: Payer: Medicare Other | Admitting: Internal Medicine

## 2023-07-19 ENCOUNTER — Other Ambulatory Visit: Payer: Self-pay

## 2023-07-19 NOTE — Progress Notes (Unsigned)
Name: Shaun [name]  MRN/ DOB: [id number], 1955-10-06    Age/ Sex: 67 y.o., male    PCP: Early, Sung Amabile, NP   Reason for Endocrinology Evaluation: Subclinical hyperthyroidism     Date of Initial Endocrinology Evaluation: 01/16/2023    HPI: [name] is a 67 y.o. male with a past medical history of atrial fibrillation, CAD, HTN, CHF, dyslipidemia, DM. The patient presented for initial endocrinology clinic visit on 01/16/2023 for consultative assistance with his subclinical hyperthyroidism.   Patient has been noted with low TSH 03/2022 at <0.01 u IU/mL, with repeat TSH of 0.033u IU/mL 05/2022.  Of note the patient has had normal free T4 and T3   Patient follows with cardiology for A-fib, CAD, and CHF   He is not on amiodarone, except for amiodarone infusion 07/2021    No Fh of thyroid disease  TRAb negative  12/2022  Patient was started on methimazole 12/2022 with a TSH of 0.350 u IU/mL, normal free T4 and T3  SUBJECTIVE:    Today (07/19/23):  Shaun [name] is here for follow-up on hyperthyroidism. He is accompanied by his spouse today  Weight has been fluctuating  Denies local neck swelling  Has chronic palpitations  Denies diarrhea or loose stools  Has chronic tremors for No Biotin  Has occasional heat intolerance  He has fatigue and anxeity  Methimazole 5 mg, 4 days a week   HISTORY:  Past Medical History:  Past Medical History:  Diagnosis Date   A-fib (HCC)    Anemia    Anginal pain (HCC)    Anxiety    Asthma    Bacterial URI 08/29/2022   CAD (coronary artery disease)    DES to distal circumflex 2016   Cataract    CHF (congestive heart failure) (HCC)    Colon polyps    30 colon polyps found on first colonoscopy   Depression    Diastolic heart failure (HCC)    Diverticulitis    DJD (degenerative joint disease)    Dyspnea    Dysrhythmia    A-Fib   Genetic testing 09/09/2014   Negative genetic testing on the ColoNext panel  test and the MSH2 inversion testing.  The ColoNext gene panel offered by [facility] and includes sequencing and rearrangement analysis for the following 17 genes: APC, BMPR1A, CDH1, CHEK2, EPCAM, GREM1, MLH1, MSH2, MSH6, MUTYH, PMS2, POLD1, POLE, PTEN, SMAD4, STK11, and TP53.   The report date is 09/08/14.      GERD (gastroesophageal reflux disease)    History of kidney stones    Hyperlipidemia    Hypernatremia 08/22/2021   Hypertension    Insomnia    Obstructive sleep apnea 12/2009   01/26/2010 AHI 83/hr   Permanent atrial fibrillation (HCC)    Onset 2006 paroxysmal then progressive to persistent   Pneumonia    PUD (peptic ulcer disease)    1980s   RLS (restless legs syndrome)    Sinusitis    Skin cancer    Type 2 diabetes mellitus (HCC)    Past Surgical History:  Past Surgical History:  Procedure Laterality Date   BIOPSY  07/17/2018   Procedure: BIOPSY;  Surgeon: West Bali, MD;  [facility];  Service: Endoscopy;;  colon   BOWEL RESECTION  09/17/2018   SMALL BOWEL RESECTION: 71 CM    CARDIAC CATHETERIZATION N/A 07/21/2015   Procedure: Left Heart Cath and Coronary Angiography;  Surgeon: Peter M Swaziland, MD;  [facility];  Service: Cardiovascular;  Laterality: N/A;   CARDIAC CATHETERIZATION N/A 07/21/2015   Procedure: Coronary Stent Intervention;  Surgeon: Peter M Swaziland, MD;  Location: [facility] INVASIVE CV LAB;  Service: Cardiovascular;  Laterality: N/A;   CIRCUMCISION N/A 04/05/2019   Procedure: CIRCUMCISION ADULT;  Surgeon: Bjorn Pippin, MD;  Location: [facility] ORS;  Service: Urology;  Laterality: N/A;   COLONOSCOPY N/A 05/19/2014   Dr. Cyndi Bender diverticulosis/moderate external hemorrhoids, >20 simple adenomas. Genetic screening negative.    COLONOSCOPY WITH PROPOFOL N/A 07/17/2018   Dr. Darrick Penna: Diverticulosis, external/internal hemorrhoids, 32 colon polyps removed.  ten tubular adenomas removed with no high-grade dysplasia.  Advised to have surveillance  colonoscopy in 3 years.   COLONOSCOPY WITH PROPOFOL N/A 06/07/2021   Procedure: COLONOSCOPY WITH PROPOFOL;  Surgeon: Lanelle Bal, DO;  [facility];  Service: Endoscopy;  Laterality: N/A;  9:30 / ASA 3  (Pt was told that his time will be given at Pre-op)   CORONARY PRESSURE/FFR STUDY Left 06/08/2017   Procedure: INTRAVASCULAR PRESSURE WIRE/FFR STUDY;  Surgeon: Yvonne Kendall, MD;  [facility];  Service: Cardiovascular;  Laterality: Left;  LAD and CFX   ESOPHAGOGASTRODUODENOSCOPY (EGD) WITH PROPOFOL N/A 07/17/2018   Dr. Darrick Penna: Low-grade narrowing Schatzki ring at the GE junction status post dilation.  Gastritis.  Biopsy with mild nonspecific reactive gastropathy.  No H. pylori.   GIVENS CAPSULE STUDY N/A 06/24/2019   normal   HERNIA REPAIR  1986   Left inguinal   INCISIONAL HERNIA REPAIR N/A 10/19/2022   Procedure: OPEN INCISIONAL HERNIA REPAIR;  Surgeon: Fritzi Mandes, MD;  Location: [facility] OR;  Service: General;  Laterality: N/A;   INSERTION OF MESH N/A 10/19/2022   Procedure: INSERTION OF MESH;  Surgeon: Fritzi Mandes, MD;  [facility] OR;  Service: General;  Laterality: N/A;   LAPAROTOMY N/A 09/17/2018   Procedure: EXPLORATORY LAPAROTOMY;  Surgeon: Lucretia Roers, MD;  Location: [facility];  Service: General;  Laterality: N/A;   LEFT HEART CATH AND CORONARY ANGIOGRAPHY N/A 06/08/2017   Procedure: LEFT HEART CATH AND CORONARY ANGIOGRAPHY;  Surgeon: Yvonne Kendall, MD;  [facility];  Service: Cardiovascular;  Laterality: N/A;   POLYPECTOMY  07/17/2018   Procedure: POLYPECTOMY;  Surgeon: West Bali, MD;  [facility];  Service: Endoscopy;;  colon   POLYPECTOMY  06/07/2021   Procedure: POLYPECTOMY INTESTINAL;  Surgeon: Lanelle Bal, DO;  [facility];  Service: Endoscopy;;   ROTATOR CUFF REPAIR     Right   SAVORY DILATION N/A 07/17/2018   Procedure: SAVORY DILATION;  Surgeon: West Bali, MD;  [facility];  Service: Endoscopy;  Laterality: N/A;    Social History:  reports that he has been smoking cigarettes. He started smoking about 53 years ago. He has a 25.5 pack-year smoking history. He has been exposed to tobacco smoke. He has never used smokeless tobacco. He reports current drug use. Drug: Hydrocodone. He reports that he does not drink alcohol. Family History: family history includes Brain cancer in his maternal uncle; Breast cancer in his cousin; Breast cancer (age of onset: 69) in his mother; Cancer in his cousin and maternal uncle; Diabetes in his brother; Heart attack in his father; Hypertension in his mother; Parkinson's disease in his brother; Skin cancer (age of onset: 78) in his sister.   HOME MEDICATIONS: Allergies as of 07/19/2023   No Known Allergies  Medication List        Accurate as of July 19, 2023  6:53 AM. If you have any questions, ask your nurse or doctor.          albuterol 108 (90 Base) MCG/ACT inhaler Commonly known as: Ventolin HFA INHALE 2 PUFFS INTO THE LUNGS EVERY 6 HOURS AS NEEDED FOR WHEEZING OR SHORTNESS OF BREATH   albuterol (2.5 MG/3ML) 0.083% nebulizer solution Commonly known as: PROVENTIL Take 3 mLs (2.5 mg total) by nebulization every 6 (six) hours as needed for wheezing or shortness of breath.   atorvastatin 80 MG tablet Commonly known as: LIPITOR Take 1 tablet (80 mg total) by mouth once daily in the evening.  Please follow up in 2-3 months. (Take 1 tablet (80 mg total) by mouth every evening.)   Azelastine HCl 137 MCG/SPRAY Soln Place 1 spray into both nostrils 2 (two) times daily. Use in each nostril as directed   buPROPion 150 MG 24 hr tablet Commonly known as: Wellbutrin XL Take 3 tablets by mouth once daily. (Take 3 tablets once daily) What changed:  how much to take how to take this when to take this additional instructions   clindamycin 1 % lotion Commonly known as: CLEOCIN T Apply topically  daily.   docusate sodium 100 MG capsule Commonly known as: COLACE Take 1 capsule (100 mg total) by mouth daily as needed for mild constipation. What changed: when to take this   Eliquis 5 MG Tabs tablet Generic drug: apixaban Take 1 tablet (5 mg total) by mouth 2 (two) times daily.   empagliflozin 10 MG Tabs tablet Commonly known as: Jardiance Take 1 tablet (10 mg total) by mouth daily before breakfast.   escitalopram 20 MG tablet Commonly known as: LEXAPRO   ezetimibe 10 MG tablet Commonly known as: ZETIA Take 1 tablet (10 mg total) by mouth daily.   FeroSul 325 (65 Fe) MG tablet Generic drug: ferrous sulfate Take 1 tablet (325 mg total) by mouth 3 (three) times a week.   furosemide 40 MG tablet Commonly known as: LASIX Take 1 tablet (40mg ) daily. May take 1/2 tablet (20mg ) additional if shortness of breath or swelling occurs.   HYDROcodone-acetaminophen 5-325 MG tablet Commonly known as: NORCO/VICODIN Take 1-2 tablets by mouth 2 (two) times daily as needed for moderate pain (pain score 4-6) or severe pain (pain score 7-10).   hydrOXYzine 25 MG tablet Commonly known as: ATARAX Take 1 tablet (25 mg total) by mouth at bedtime and may repeat dose one time if needed. For sleep.   metFORMIN 500 MG tablet Commonly known as: GLUCOPHAGE Take 1 tablet (500mg ) by mouth twice daily with a meal. (Take 1 tablet (500 mg total) by mouth 2 (two) times daily before a meal.)   methimazole 5 MG tablet Commonly known as: TAPAZOLE Take 1 tablet (5 mg total) by mouth 4 days a week.   Metoprolol Tartrate 75 MG Tabs Take 1 tablet by mouth twice daily. (Take 75 mg by mouth 2 (two) times daily.)   nitroGLYCERIN 0.4 MG SL tablet Commonly known as: NITROSTAT Place 1 tablet (0.4 mg total) under the tongue every 5 (five) minutes as needed.   omeprazole 40 MG capsule Commonly known as: PRILOSEC Take 1 capsule by mouth once daily 30 minutes before breakfast   polyethylene glycol 17 g  packet Commonly known as: MIRALAX / GLYCOLAX Take 17 g by mouth daily as needed for mild constipation.   potassium chloride SA 20 MEQ tablet Commonly known  asJerene Dilling Take 1 tablet (20 mEq total) by mouth daily.   rOPINIRole 3 MG tablet Commonly known as: REQUIP Take 1 tablet by mouth nightly for restless leg, and a 1/2 tab during the day to control breakthrough symptoms   sildenafil 100 MG tablet Commonly known as: VIAGRA Take 1 tablet (100 mg total) by mouth 30 minutes before activity   sucralfate 1 g tablet Commonly known as: CARAFATE Take one tablet po BID PRN   tiZANidine 4 MG tablet Commonly known as: Zanaflex Take 1 tablet (4 mg total) by mouth 2 (two) times daily as needed for muscle spasms.   traZODone 150 MG tablet Commonly known as: DESYREL Take 1 tablet (150 mg total) by mouth at bedtime.          REVIEW OF SYSTEMS: A comprehensive ROS was conducted with the patient and is negative except as per HPI    OBJECTIVE:  VS: There were no vitals taken for this visit.   Wt Readings from Last 3 Encounters:  07/10/23 224 lb 6.4 oz (101.8 kg)  06/29/23 224 lb 3.2 oz (101.7 kg)  04/10/23 218 lb (98.9 kg)     EXAM: General: Pt appears well and is in NAD  Neck: General: Supple without adenopathy. Thyroid: Thyroid size normal.  No goiter or nodules appreciated.   Lungs: Clear with good BS bilat   Heart: Auscultation: RRR.  Abdomen: Soft, nontender  Extremities:  BL LE: No pretibial edema   Mental Status: Judgment, insight: Intact Orientation: Oriented to time, place, and person Mood and affect: No depression, anxiety, or agitation     DATA REVIEWED:     ASSESSMENT/PLAN/RECOMMENDATIONS:   Subclinical Hyperthyroidism:  -Patient with nonspecific symptoms -No local neck symptoms -Discussed differential diagnosis to include subacute thyroiditis, Graves' disease, autonomous thyroid nodule -We discussed the importance of euthyroidism in the setting  of cardiac arrhythmia -We did not have a phlebotomist for repeat labs today, labs were printed and provided to the patient to have done through [facility] -Will be awaiting these results before making a determination regarding his treatment  Signed electronically by: Lyndle Herrlich, MD  [facility] Endocrinology  [facility] [address] [place] [place], [place] [address] Phone: [phone] FAX: [phone]   CC: [name], NP [address] [place] [address] Phone: [phone] Fax: [phone]   Return to Endocrinology clinic as below: Future Appointments  Date Time Provider Department Center  07/19/2023  9:30 AM Glorious Flicker, Konrad Dolores, MD [facility] None  08/11/2023  9:30 AM Erick Colace, MD [facility] CPR  08/14/2023  2:05 PM [facility] ECHO 5 [id number]  08/17/2023  8:00 AM Azalee Course, PA [facility] None  09/01/2023 10:00 AM Tiffany Kocher, PA-C [facility]  09/05/2023  6:40 AM [facility] PET CT 1 [facility] [place]  05/14/2024  9:15 AM PFM-ANNUAL WELLNESS VISIT P[facility]M [facility]

## 2023-07-25 ENCOUNTER — Other Ambulatory Visit: Payer: Medicare Other

## 2023-07-25 ENCOUNTER — Other Ambulatory Visit (HOSPITAL_BASED_OUTPATIENT_CLINIC_OR_DEPARTMENT_OTHER): Payer: Self-pay

## 2023-08-01 LAB — HM DIABETES EYE EXAM

## 2023-08-04 ENCOUNTER — Other Ambulatory Visit (HOSPITAL_BASED_OUTPATIENT_CLINIC_OR_DEPARTMENT_OTHER): Payer: Self-pay

## 2023-08-10 ENCOUNTER — Other Ambulatory Visit: Payer: Self-pay

## 2023-08-10 ENCOUNTER — Other Ambulatory Visit: Payer: Self-pay | Admitting: Nurse Practitioner

## 2023-08-10 DIAGNOSIS — F17210 Nicotine dependence, cigarettes, uncomplicated: Secondary | ICD-10-CM

## 2023-08-10 DIAGNOSIS — Z87891 Personal history of nicotine dependence: Secondary | ICD-10-CM

## 2023-08-10 DIAGNOSIS — E611 Iron deficiency: Secondary | ICD-10-CM

## 2023-08-10 DIAGNOSIS — M2550 Pain in unspecified joint: Secondary | ICD-10-CM

## 2023-08-10 DIAGNOSIS — Z122 Encounter for screening for malignant neoplasm of respiratory organs: Secondary | ICD-10-CM

## 2023-08-11 ENCOUNTER — Encounter: Payer: Self-pay | Admitting: Physical Medicine & Rehabilitation

## 2023-08-11 ENCOUNTER — Encounter: Payer: Medicare Other | Attending: Physical Medicine & Rehabilitation | Admitting: Physical Medicine & Rehabilitation

## 2023-08-11 ENCOUNTER — Other Ambulatory Visit (HOSPITAL_BASED_OUTPATIENT_CLINIC_OR_DEPARTMENT_OTHER): Payer: Self-pay

## 2023-08-11 VITALS — BP 129/79 | HR 75 | Temp 98.0°F | Ht 68.0 in | Wt 220.0 lb

## 2023-08-11 DIAGNOSIS — M1611 Unilateral primary osteoarthritis, right hip: Secondary | ICD-10-CM | POA: Diagnosis not present

## 2023-08-11 MED ORDER — LIDOCAINE HCL (PF) 1 % IJ SOLN
4.0000 mL | Freq: Once | INTRAMUSCULAR | Status: AC
  Administered 2023-08-11: 4 mL

## 2023-08-11 MED ORDER — BETAMETHASONE SOD PHOS & ACET 6 (3-3) MG/ML IJ SUSP
9.0000 mg | Freq: Once | INTRAMUSCULAR | Status: AC
  Administered 2023-08-11: 9 mg via INTRA_ARTICULAR

## 2023-08-11 MED ORDER — LIDOCAINE HCL 1 % IJ SOLN
10.0000 mL | Freq: Once | INTRAMUSCULAR | Status: AC
Start: 2023-08-11 — End: 2023-08-11
  Administered 2023-08-11: 10 mL

## 2023-08-11 MED ORDER — FERROUS SULFATE 325 (65 FE) MG PO TABS
325.0000 mg | ORAL_TABLET | ORAL | 1 refills | Status: DC
  Filled 2023-08-11: qty 30, 70d supply, fill #0
  Filled 2023-10-24: qty 30, 70d supply, fill #1

## 2023-08-11 MED ORDER — IOHEXOL 180 MG/ML  SOLN
3.0000 mL | Freq: Once | INTRAMUSCULAR | Status: AC
Start: 2023-08-11 — End: 2023-08-11
  Administered 2023-08-11: 3 mL

## 2023-08-11 NOTE — Telephone Encounter (Signed)
Last apt 02/14/23.

## 2023-08-11 NOTE — Progress Notes (Signed)
RIght hip intra-articular injection under fluoro guidance  Indication osteoarthritis unresponsive to conservative care including exercise and oral medications  Informed consent was obtained after describing risks and benefits of the procedure, this includes bleeding bruising and infection. The patient elected to proceed and has given written consent  Placed supine on fluoroscopy table.  Betadine prep to groin area.  Imaging to identify the intertrochanteric line at the base of the neck of the femur. 5 cc of 1% lidocaine were infiltrated into the skin and subcu tissue using 25-gauge 1.5 inch needle.  Then a 22-gauge 5" needle was inserted under fluoroscopic guidance targeting the junction of the  Right  femoral head and femoral neck area.  Bone contact was made.  Isovue 200 times a total of 1.5 cc were injected needle was adjusted to achieve intra-articular location. Then a solution containing 1.5 cc of 6 mg/cc Celestone and 4 cc of 1% lidocaine were injected.  Patient tolerated procedure well post procedure instructions given

## 2023-08-11 NOTE — Patient Instructions (Signed)
Right hip injection for severe osteoarthritis

## 2023-08-11 NOTE — Progress Notes (Signed)
  PROCEDURE RECORD [facility]   Name: TREYOR CAVAZOS DOB:1956-08-04 MRN: [id number]  Date:08/11/2023  Physician: Claudette Laws, MD    Nurse/CMA: Nedra Hai. CMA  Allergies: No Known Allergies  Consent Signed: Yes.    Is patient diabetic? Yes.    CBG today? 134  Pregnant: No. LMP: No LMP for male patient. (age [age])  Anticoagulants: yes (Eliquis) Anti-inflammatory: no Antibiotics: no  Procedure: Right Hip Intra-articular Injection  Position: Supine Start Time: 9:49 am  End Time: 9:53 am  Fluoro Time: 25  RN/CMA Nedra Hai, CMA Xitlalic Maslin, CMA    Time 9:22 am 10:02 am    BP 129/79 131/81    Pulse 75 75    Respirations 16 16    O2 Sat 94 94    S/S 6 6    Pain Level 5/10 1/10     D/C home with wife, patient A & O X 3, D/C instructions reviewed, and sits independently.

## 2023-08-14 ENCOUNTER — Ambulatory Visit (HOSPITAL_COMMUNITY): Payer: Medicare Other | Attending: Emergency Medicine

## 2023-08-14 DIAGNOSIS — R0602 Shortness of breath: Secondary | ICD-10-CM | POA: Diagnosis not present

## 2023-08-14 DIAGNOSIS — I5032 Chronic diastolic (congestive) heart failure: Secondary | ICD-10-CM | POA: Diagnosis present

## 2023-08-14 LAB — ECHOCARDIOGRAM COMPLETE: S' Lateral: 2.8 cm

## 2023-08-15 ENCOUNTER — Other Ambulatory Visit (HOSPITAL_BASED_OUTPATIENT_CLINIC_OR_DEPARTMENT_OTHER): Payer: Self-pay

## 2023-08-15 ENCOUNTER — Other Ambulatory Visit: Payer: Self-pay

## 2023-08-15 MED ORDER — TIZANIDINE HCL 4 MG PO TABS
4.0000 mg | ORAL_TABLET | Freq: Two times a day (BID) | ORAL | 0 refills | Status: DC | PRN
  Filled 2023-08-15: qty 60, 30d supply, fill #0

## 2023-08-17 ENCOUNTER — Ambulatory Visit: Payer: Medicare Other | Admitting: Physician Assistant

## 2023-08-17 ENCOUNTER — Other Ambulatory Visit (HOSPITAL_BASED_OUTPATIENT_CLINIC_OR_DEPARTMENT_OTHER): Payer: Self-pay

## 2023-08-17 MED ORDER — DOXYCYCLINE HYCLATE 100 MG PO CAPS
100.0000 mg | ORAL_CAPSULE | Freq: Two times a day (BID) | ORAL | 0 refills | Status: AC
  Filled 2023-08-17: qty 10, 5d supply, fill #0

## 2023-08-17 MED ORDER — PREDNISONE 20 MG PO TABS
40.0000 mg | ORAL_TABLET | Freq: Every day | ORAL | 0 refills | Status: DC
  Filled 2023-08-17: qty 10, 5d supply, fill #0

## 2023-08-19 NOTE — Progress Notes (Signed)
This encounter was created in error - please disregard.

## 2023-08-20 ENCOUNTER — Other Ambulatory Visit: Payer: Self-pay | Admitting: Nurse Practitioner

## 2023-08-20 DIAGNOSIS — M2550 Pain in unspecified joint: Secondary | ICD-10-CM

## 2023-08-21 ENCOUNTER — Other Ambulatory Visit (HOSPITAL_BASED_OUTPATIENT_CLINIC_OR_DEPARTMENT_OTHER): Payer: Self-pay

## 2023-08-21 MED ORDER — HYDROCODONE-ACETAMINOPHEN 5-325 MG PO TABS
1.0000 | ORAL_TABLET | Freq: Two times a day (BID) | ORAL | 0 refills | Status: DC | PRN
Start: 2023-08-21 — End: 2023-10-20
  Filled 2023-08-21: qty 120, 30d supply, fill #0

## 2023-08-21 NOTE — Telephone Encounter (Signed)
Called patient and he did request this. Not and auto-refill from the pharmacy.

## 2023-08-30 ENCOUNTER — Other Ambulatory Visit: Payer: Self-pay | Admitting: Nurse Practitioner

## 2023-08-30 DIAGNOSIS — G2581 Restless legs syndrome: Secondary | ICD-10-CM

## 2023-08-30 DIAGNOSIS — N471 Phimosis: Secondary | ICD-10-CM

## 2023-08-31 ENCOUNTER — Other Ambulatory Visit (HOSPITAL_BASED_OUTPATIENT_CLINIC_OR_DEPARTMENT_OTHER): Payer: Self-pay

## 2023-08-31 MED ORDER — SILDENAFIL CITRATE 100 MG PO TABS
100.0000 mg | ORAL_TABLET | ORAL | 1 refills | Status: DC
  Filled 2023-08-31: qty 8, 30d supply, fill #0
  Filled 2023-10-06: qty 8, 30d supply, fill #1

## 2023-08-31 MED ORDER — ROPINIROLE HCL 3 MG PO TABS
ORAL_TABLET | ORAL | 1 refills | Status: DC
  Filled 2023-08-31: qty 45, 30d supply, fill #0
  Filled 2023-09-28: qty 45, 30d supply, fill #1

## 2023-09-01 ENCOUNTER — Encounter (HOSPITAL_COMMUNITY): Payer: Self-pay

## 2023-09-01 ENCOUNTER — Ambulatory Visit: Payer: Medicare Other | Admitting: Gastroenterology

## 2023-09-01 NOTE — Progress Notes (Deleted)
 GI Office Note    Referring Provider: Early, Camie BRAVO, NP Primary Care Physician:  Oris Camie BRAVO, NP  Primary Gastroenterologist: Carlin POUR. Cindie, DO   Chief Complaint   No chief complaint on file.   History of Present Illness   Shaun [name] is a 68 y.o. male presenting today for one year follow up. He has h/o IDA, GERD, dysphagia/schatki ring, adenomatous colon polyps.   EGD/TCS 06/2018: low-grade narrowing Schatzki ring which was dilated.  Gastritis.  32 polyps removed from the colon, 10 were tubular adenomas.   Capsule endoscopy to complete GI work-up 2020 was normal.  On chronic Eliquis .   Colonoscopy 06/07/2021 with 4 small tubular adenomas removed.  Recommended recall 5 years.      Medications   Current Outpatient Medications  Medication Sig Dispense Refill   albuterol  (PROVENTIL ) (2.5 MG/3ML) 0.083% nebulizer solution Take 3 mLs (2.5 mg total) by nebulization every 6 (six) hours as needed for wheezing or shortness of breath. 75 mL 12   albuterol  (VENTOLIN  HFA) 108 (90 Base) MCG/ACT inhaler INHALE 2 PUFFS INTO THE LUNGS EVERY 6 HOURS AS NEEDED FOR WHEEZING OR SHORTNESS OF BREATH 6.7 g 1   apixaban  (ELIQUIS ) 5 MG TABS tablet Take 1 tablet (5 mg total) by mouth 2 (two) times daily. 180 tablet 1   atorvastatin  (LIPITOR ) 80 MG tablet Take 1 tablet (80 mg total) by mouth every evening. 90 tablet 2   azelastine  (ASTELIN ) 0.1 % nasal spray Place 1 spray into both nostrils 2 (two) times daily. Use in each nostril as directed 30 mL 12   buPROPion  (WELLBUTRIN  XL) 150 MG 24 hr tablet Take 3 tablets once daily (Patient taking differently: Take 150 mg by mouth 3 (three) times daily.) 270 tablet 3   clindamycin  (CLEOCIN  T) 1 % lotion Apply topically daily.     docusate sodium  (COLACE) 100 MG capsule Take 1 capsule (100 mg total) by mouth daily as needed for mild constipation. (Patient taking differently: Take 100 mg by mouth daily.) 100 capsule 3   empagliflozin   (JARDIANCE ) 10 MG TABS tablet Take 1 tablet (10 mg total) by mouth daily before breakfast. 60 tablet 1   escitalopram  (LEXAPRO ) 20 MG tablet      ezetimibe  (ZETIA ) 10 MG tablet Take 1 tablet (10 mg total) by mouth daily. 90 tablet 3   ferrous sulfate  (FEROSUL) 325 (65 FE) MG tablet Take 1 tablet (325 mg total) by mouth 3 (three) times a week. 30 tablet 1   furosemide  (LASIX ) 40 MG tablet Take 1 tablet (40mg ) daily. May take 1/2 tablet (20mg ) additional if shortness of breath or swelling occurs. 120 tablet 2   HYDROcodone -acetaminophen  (NORCO/VICODIN) 5-325 MG tablet Take 1-2 tablets by mouth 2 (two) times daily as needed for moderate pain (pain score 4-6) or severe pain (pain score 7-10). 120 tablet 0   hydrOXYzine  (ATARAX ) 25 MG tablet Take 1 tablet (25 mg total) by mouth at bedtime and may repeat dose one time if needed. For sleep. 60 tablet 2   metFORMIN  (GLUCOPHAGE ) 500 MG tablet Take 1 tablet (500 mg total) by mouth 2 (two) times daily before a meal. 180 tablet 3   methimazole  (TAPAZOLE ) 5 MG tablet Take 1 tablet (5 mg total) by mouth 4 days a week. 52 tablet 1   Metoprolol  Tartrate 75 MG TABS Take 75 mg by mouth 2 (two) times daily. 120 tablet 3   nitroGLYCERIN  (NITROSTAT ) 0.4 MG SL tablet Place 1 tablet (0.4  mg total) under the tongue every 5 (five) minutes as needed. 25 tablet 3   omeprazole  (PRILOSEC) 40 MG capsule Take 1 capsule by mouth once daily 30 minutes before breakfast 90 capsule 3   polyethylene glycol (MIRALAX  / GLYCOLAX ) 17 g packet Take 17 g by mouth daily as needed for mild constipation. 14 each 0   potassium chloride  SA (KLOR-CON  M) 20 MEQ tablet Take 1 tablet (20 mEq total) by mouth daily. 90 tablet 3   rOPINIRole  (REQUIP ) 3 MG tablet Take 1 tablet by mouth nightly for restless leg, and a 1/2 tab during the day to control breakthrough symptoms 45 tablet 1   sildenafil  (VIAGRA ) 100 MG tablet Take 1 tablet (100 mg total) by mouth 30 minutes before activity 8 tablet 1    sucralfate  (CARAFATE ) 1 g tablet Take one tablet po BID PRN 42 tablet 5   tiZANidine  (ZANAFLEX ) 4 MG tablet Take 1 tablet (4 mg total) by mouth 2 (two) times daily as needed for muscle spasms. 60 tablet 0   traZODone  (DESYREL ) 150 MG tablet Take 1 tablet (150 mg total) by mouth at bedtime. 90 tablet 1   Current Facility-Administered Medications  Medication Dose Route Frequency Provider Last Rate Last Admin   lidocaine  (XYLOCAINE ) 1 % (with pres) injection 5 mL  5 mL Other Once Kirsteins, Prentice BRAVO, MD        Allergies   Allergies as of 09/01/2023   (No Known Allergies)     Past Medical History   Past Medical History:  Diagnosis Date   A-fib (HCC)    Anemia    Anginal pain (HCC)    Anxiety    Asthma    Bacterial URI 08/29/2022   CAD (coronary artery disease)    DES to distal circumflex 2016   Cataract    CHF (congestive heart failure) (HCC)    Colon polyps    30 colon polyps found on first colonoscopy   Depression    Diastolic heart failure (HCC)    Diverticulitis    DJD (degenerative joint disease)    Dyspnea    Dysrhythmia    A-Fib   Genetic testing 09/09/2014   Negative genetic testing on the ColoNext panel test and the MSH2 inversion testing.  The ColoNext gene panel offered by [facility] and includes sequencing and rearrangement analysis for the following 17 genes: APC, BMPR1A, CDH1, CHEK2, EPCAM, GREM1, MLH1, MSH2, MSH6, MUTYH, PMS2, POLD1, POLE, PTEN, SMAD4, STK11, and TP53.   The report date is 09/08/14.      GERD (gastroesophageal reflux disease)    History of kidney stones    Hyperlipidemia    Hypernatremia 08/22/2021   Hypertension    Insomnia    Obstructive sleep apnea 12/2009   01/26/2010 AHI 83/hr   Permanent atrial fibrillation (HCC)    Onset 2006 paroxysmal then progressive to persistent   Pneumonia    PUD (peptic ulcer disease)    1980s   RLS (restless legs syndrome)    Sinusitis    Skin cancer    Type 2 diabetes mellitus [facility])     Past  Surgical History   Past Surgical History:  Procedure Laterality Date   BIOPSY  07/17/2018   Procedure: BIOPSY;  Surgeon: Harvey Margo CROME, MD;  Location: [facility];  Service: Endoscopy;;  colon   BOWEL RESECTION  09/17/2018   SMALL BOWEL RESECTION: 71 CM    CARDIAC CATHETERIZATION N/A 07/21/2015   Procedure: Left Heart Cath and Coronary Angiography;  Surgeon: Peter M Jordan, MD;  Location: [facility] INVASIVE CV LAB;  Service: Cardiovascular;  Laterality: N/A;   CARDIAC CATHETERIZATION N/A 07/21/2015   Procedure: Coronary Stent Intervention;  Surgeon: Peter M Jordan, MD;  Location: [facility] INVASIVE CV LAB;  Service: Cardiovascular;  Laterality: N/A;   CIRCUMCISION N/A 04/05/2019   Procedure: CIRCUMCISION ADULT;  Surgeon: Watt Rush, MD;  Location: [facility] ORS;  Service: Urology;  Laterality: N/A;   COLONOSCOPY N/A 05/19/2014   Dr. Sharla diverticulosis/moderate external hemorrhoids, >20 simple adenomas. Genetic screening negative.    COLONOSCOPY WITH PROPOFOL  N/A 07/17/2018   Dr. harvey: Diverticulosis, external/internal hemorrhoids, 32 colon polyps removed.  ten tubular adenomas removed with no high-grade dysplasia.  Advised to have surveillance colonoscopy in 3 years.   COLONOSCOPY WITH PROPOFOL  N/A 06/07/2021   Procedure: COLONOSCOPY WITH PROPOFOL ;  Surgeon: Cindie Carlin POUR, DO;  Location: [facility];  Service: Endoscopy;  Laterality: N/A;  9:30 / ASA 3  (Pt was told that his time will be given at Pre-op)   CORONARY PRESSURE/FFR STUDY Left 06/08/2017   Procedure: INTRAVASCULAR PRESSURE WIRE/FFR STUDY;  Surgeon: Mady Bruckner, MD;  Location: [facility] INVASIVE CV LAB;  Service: Cardiovascular;  Laterality: Left;  LAD and CFX   ESOPHAGOGASTRODUODENOSCOPY (EGD) WITH PROPOFOL  N/A 07/17/2018   Dr. harvey: Low-grade narrowing Schatzki ring at the GE junction status post dilation.  Gastritis.  Biopsy with mild nonspecific reactive gastropathy.  No H. pylori.   GIVENS CAPSULE STUDY N/A 06/24/2019   normal    HERNIA REPAIR  1986   Left inguinal   INCISIONAL HERNIA REPAIR N/A 10/19/2022   Procedure: OPEN INCISIONAL HERNIA REPAIR;  Surgeon: Dasie Leonor CROME, MD;  Location: [facility] OR;  Service: General;  Laterality: N/A;   INSERTION OF MESH N/A 10/19/2022   Procedure: INSERTION OF MESH;  Surgeon: Dasie Leonor CROME, MD;  Location: [facility] OR;  Service: General;  Laterality: N/A;   LAPAROTOMY N/A 09/17/2018   Procedure: EXPLORATORY LAPAROTOMY;  Surgeon: Kallie Manuelita BROCKS, MD;  Location: [facility] OR[facility];  Service: General;  Laterality: N/A;   LEFT HEART CATH AND CORONARY ANGIOGRAPHY N/A 06/08/2017   Procedure: LEFT HEART CATH AND CORONARY ANGIOGRAPHY;  Surgeon: Mady Bruckner, MD;  Location: [facility] INVASIVE CV LAB;  Service: Cardiovascular;  Laterality: N/A;   POLYPECTOMY  07/17/2018   Procedure: POLYPECTOMY;  Surgeon: Harvey Margo CROME, MD;  [facility];  Service: Endoscopy;;  colon   POLYPECTOMY  06/07/2021   Procedure: POLYPECTOMY INTESTINAL;  Surgeon: Cindie Carlin POUR, DO;  Location[facility];  Service: Endoscopy;;   ROTATOR CUFF REPAIR     Right   SAVORY DILATION N/A 07/17/2018   Procedure: SAVORY DILATION;  Surgeon: Harvey Margo CROME, MD;  Location: [facility];  Service: Endoscopy;  Laterality: N/A;    Past Family History   Family History  Problem Relation Age of Onset   Hypertension Mother    Breast cancer Mother 52       brain/bone    Heart attack Father    Skin cancer Sister 76   Diabetes Brother    Parkinson's disease Brother    Brain cancer Maternal Uncle    Cancer Maternal Uncle        NOS   Breast cancer Cousin        maternal cousin dx <50   Cancer Cousin    Colon cancer Neg Hx     Past Social History   Social History   Socioeconomic History   Marital status: Married  Spouse name: [name] Gause   Number of children: 5   Years of education: 12   Highest education level: 12th grade  Occupational History   Occupation: employed    Associate Professor: PROGRAMME RESEARCHER, BROADCASTING/FILM/VIDEO    Comment:  full-time  Tobacco Use   Smoking status: Every Day    Current packs/day: 0.00    Average packs/day: 0.5 packs/day for 51.0 years (25.5 ttl pk-yrs)    Types: Cigarettes    Start date: 07/24/1970    Last attempt to quit: 08/06/2021    Years since quitting: 2.0    Passive exposure: Past   Smokeless tobacco: Never   Tobacco comments:    07/10/2023 Patient smokes 10-12 cigarettes daily  Vaping Use   Vaping status: Never Used  Substance and Sexual Activity   Alcohol use: No    Alcohol/week: 0.0 standard drinks of alcohol   Drug use: Yes    Types: Hydrocodone    Sexual activity: Yes    Partners: Female  Other Topics Concern   Not on file  Social History Narrative   Not on file   Social Drivers of Health   Financial Resource Strain: Low Risk  (05/09/2023)   Overall Financial Resource Strain (CARDIA)    Difficulty of Paying Living Expenses: Not hard at all  Recent Concern: Financial Resource Strain - Medium Risk (02/14/2023)   Overall Financial Resource Strain (CARDIA)    Difficulty of Paying Living Expenses: Somewhat hard  Food Insecurity: No Food Insecurity (05/09/2023)   Hunger Vital Sign    Worried About Running Out of Food in the Last Year: Never true    Ran Out of Food in the Last Year: Never true  Recent Concern: Food Insecurity - Food Insecurity Present (02/14/2023)   Hunger Vital Sign    Worried About Running Out of Food in the Last Year: Sometimes true    Ran Out of Food in the Last Year: Never true  Transportation Needs: No Transportation Needs (05/09/2023)   PRAPARE - Administrator, Civil Service (Medical): No    Lack of Transportation (Non-Medical): No  Physical Activity: Sufficiently Active (05/09/2023)   Exercise Vital Sign    Days of Exercise per Week: 4 days    Minutes of Exercise per Session: 40 min  Stress: No Stress Concern Present (05/09/2023)   [employer] of Occupational Health - Occupational Stress Questionnaire    Feeling of Stress : Only  a little  Recent Concern: Stress - Stress Concern Present (02/14/2023)   [employer] of Occupational Health - Occupational Stress Questionnaire    Feeling of Stress : Rather much  Social Connections: Moderately Isolated (05/09/2023)   Social Connection and Isolation Panel [NHANES]    Frequency of Communication with Friends and Family: Three times a week    Frequency of Social Gatherings with Friends and Family: Once a week    Attends Religious Services: Never    Database Administrator or Organizations: No    Attends Banker Meetings: Never    Marital Status: Married  Catering Manager Violence: Not At Risk (05/09/2023)   Humiliation, Afraid, Rape, and Kick questionnaire    Fear of Current or Ex-Partner: No    Emotionally Abused: No    Physically Abused: No    Sexually Abused: No    Review of Systems   General: Negative for anorexia, weight loss, fever, chills, fatigue, weakness. ENT: Negative for hoarseness, difficulty swallowing , nasal congestion. CV: Negative for chest pain, angina, palpitations, dyspnea on exertion,  peripheral edema.  Respiratory: Negative for dyspnea at rest, dyspnea on exertion, cough, sputum, wheezing.  GI: See history of present illness. GU:  Negative for dysuria, hematuria, urinary incontinence, urinary frequency, nocturnal urination.  Endo: Negative for unusual weight change.     Physical Exam   There were no vitals taken for this visit.   General: Well-nourished, well-developed in no acute distress.  Eyes: No icterus. Mouth: Oropharyngeal mucosa moist and pink , no lesions erythema or exudate. Lungs: Clear to auscultation bilaterally.  Heart: Regular rate and rhythm, no murmurs rubs or gallops.  Abdomen: Bowel sounds are normal, nontender, nondistended, no hepatosplenomegaly or masses,  no abdominal bruits or hernia , no rebound or guarding.  Rectal: ***  Extremities: No lower extremity edema. No clubbing or deformities. Neuro:  Alert and oriented x 4   Skin: Warm and dry, no jaundice.   Psych: Alert and cooperative, normal mood and affect.  Labs   Lab Results  Component Value Date   NA 137 06/17/2023   CL 103 06/17/2023   K 4.1 06/17/2023   CO2 23 06/17/2023   BUN 18 06/17/2023   CREATININE 1.11 06/17/2023   GFRNONAA >60 06/17/2023   CALCIUM  9.4 06/17/2023   PHOS 3.3 08/14/2021   ALBUMIN  3.0 (L) 10/31/2022   GLUCOSE 177 (H) 06/17/2023   Lab Results  Component Value Date   ALT 25 10/31/2022   AST 21 10/31/2022   ALKPHOS 81 10/31/2022   BILITOT 1.0 10/31/2022   Lab Results  Component Value Date   WBC 10.0 06/17/2023   HGB 14.4 06/17/2023   HCT 42.8 06/17/2023   MCV 82.8 06/17/2023   PLT 163 06/17/2023   Lab Results  Component Value Date   TSH 0.84 03/31/2023    Imaging Studies   ECHOCARDIOGRAM COMPLETE Result Date: 08/14/2023    ECHOCARDIOGRAM REPORT   Patient Name:   Shaun [name] Date of Exam: 08/14/2023 Medical Rec #:  [id number]          Height:       68.0 in Accession #:    [id number]         Weight:       220.0 lb Date of Birth:  Jun 12, 1956         BSA:          2.128 m Patient Age:    67 years           BP:           129/79 mmHg Patient Gender: M                  HR:           92 bpm. Exam Location:  [address] Procedure: 2D Echo, Cardiac Doppler and Color Doppler Indications:    I50.32 Chronic diastolic (congestive) heart failure  History:        Patient has prior history of Echocardiogram examinations, most                 recent 08/10/2021. CAD, COPD, Arrythmias:Atrial Fibrillation,                 Signs/Symptoms:Chest Pain; Risk Factors:Sleep Apnea,                 Hypertension, Diabetes and Dyslipidemia.  Sonographer:    Jon Hacker RCS Referring Phys: LUM CROME FOUNTAIN IMPRESSIONS  1. Left ventricular ejection fraction, by estimation, is 65 to 70%. The left ventricle has normal function. The  left ventricle has no regional wall motion abnormalities. There is moderate left  ventricular hypertrophy. Left ventricular diastolic parameters are indeterminate.  2. Right ventricular systolic function is normal. The right ventricular size is normal. Tricuspid regurgitation signal is inadequate for assessing PA pressure.  3. The mitral valve is normal in structure. Trivial mitral valve regurgitation. No evidence of mitral stenosis.  4. The aortic valve is tricuspid. Aortic valve regurgitation is not visualized. No aortic stenosis is present. FINDINGS  Left Ventricle: Left ventricular ejection fraction, by estimation, is 65 to 70%. The left ventricle has normal function. The left ventricle has no regional wall motion abnormalities. The left ventricular internal cavity size was normal in size. There is  moderate left ventricular hypertrophy. Left ventricular diastolic parameters are indeterminate. Right Ventricle: The right ventricular size is normal. No increase in right ventricular wall thickness. Right ventricular systolic function is normal. Tricuspid regurgitation signal is inadequate for assessing PA pressure. Left Atrium: Left atrial size was normal in size. Right Atrium: Right atrial size was normal in size. Pericardium: There is no evidence of pericardial effusion. Mitral Valve: The mitral valve is normal in structure. Trivial mitral valve regurgitation. No evidence of mitral valve stenosis. Tricuspid Valve: The tricuspid valve is normal in structure. Tricuspid valve regurgitation is trivial. Aortic Valve: The aortic valve is tricuspid. Aortic valve regurgitation is not visualized. No aortic stenosis is present. Pulmonic Valve: The pulmonic valve was not well visualized. Pulmonic valve regurgitation is trivial. Aorta: The aortic root and ascending aorta are structurally normal, with no evidence of dilitation. IAS/Shunts: The interatrial septum was not well visualized.  LEFT VENTRICLE PLAX 2D LVIDd:         4.80 cm LVIDs:         2.80 cm LV PW:         1.40 cm LV IVS:        1.30 cm LVOT  diam:     2.40 cm LV SV:         63 LV SV Index:   30 LVOT Area:     4.52 cm  RIGHT VENTRICLE RV S prime:     16.50 cm/s TAPSE (M-mode): 1.4 cm LEFT ATRIUM             Index        RIGHT ATRIUM           Index LA diam:        5.50 cm 2.58 cm/m   RA Area:     11.30 cm LA Vol (A2C):   76.3 ml 35.85 ml/m  RA Volume:   21.90 ml  10.29 ml/m LA Vol (A4C):   41.1 ml 19.31 ml/m LA Biplane Vol: 60.1 ml 28.24 ml/m  AORTIC VALVE LVOT Vmax:   80.87 cm/s LVOT Vmean:  50.133 cm/s LVOT VTI:    0.139 m  AORTA Ao Root diam: 3.60 cm Ao Asc diam:  3.60 cm  SHUNTS Systemic VTI:  0.14 m Systemic Diam: 2.40 cm Lonni Nanas MD Electronically signed by Lonni Nanas MD Signature Date/Time: 08/14/2023/8:48:45 PM    Final     Assessment       PLAN   ***   Shaun [name], Shaun [name], Shaun [name] [facility]

## 2023-09-05 ENCOUNTER — Encounter (HOSPITAL_COMMUNITY)
Admission: RE | Admit: 2023-09-05 | Discharge: 2023-09-05 | Disposition: A | Discharge status: Home / Self Care | Payer: Medicare Other | Source: Ambulatory Visit | Attending: Emergency Medicine | Admitting: Emergency Medicine

## 2023-09-05 DIAGNOSIS — R079 Chest pain, unspecified: Secondary | ICD-10-CM | POA: Insufficient documentation

## 2023-09-05 LAB — NM PET CT CARDIAC PERFUSION MULTI W/ABSOLUTE BLOODFLOW
LV dias vol: 95 mL (ref 62–150)
MBFR: 1.59
Nuc Rest EF: 47 %
Nuc Stress EF: 51 %
Peak HR: 104 {beats}/min
Rest HR: 94 {beats}/min
Rest MBF: 0.8 ml/g/min
Rest Nuclear Isotope Dose: 26 mCi
Rest perfusion cavity size (mL): 95 mL
ST Depression (mm): 0 mm
Stress MBF: 1.27 ml/g/min
Stress Nuclear Isotope Dose: 26 mCi
Stress perfusion cavity size (mL): 103 mL
TID: 1.05

## 2023-09-05 MED ORDER — RUBIDIUM RB82 GENERATOR (RUBYFILL)
25.9200 | PACK | Freq: Once | INTRAVENOUS | Status: AC
  Administered 2023-09-05: 25.92 via INTRAVENOUS

## 2023-09-05 MED ORDER — RUBIDIUM RB82 GENERATOR (RUBYFILL)
26.0700 | PACK | Freq: Once | INTRAVENOUS | Status: AC
  Administered 2023-09-05: 26.07 via INTRAVENOUS

## 2023-09-05 MED ORDER — CAFFEINE CITRATE BASE COMPONENT 10 MG/ML IV SOLN
INTRAVENOUS | Status: AC
  Filled 2023-09-05: qty 3

## 2023-09-05 MED ORDER — REGADENOSON 0.4 MG/5ML IV SOLN
0.4000 mg | Freq: Once | INTRAVENOUS | Status: AC
  Administered 2023-09-05: 0.4 mg via INTRAVENOUS

## 2023-09-05 MED ORDER — DEXTROSE 5 % IV SOLN
INTRAVENOUS | Status: AC
  Filled 2023-09-05: qty 50

## 2023-09-05 MED ORDER — REGADENOSON 0.4 MG/5ML IV SOLN
INTRAVENOUS | Status: AC
  Filled 2023-09-05: qty 5

## 2023-09-07 ENCOUNTER — Other Ambulatory Visit: Payer: Self-pay | Admitting: Cardiology

## 2023-09-07 ENCOUNTER — Other Ambulatory Visit (HOSPITAL_BASED_OUTPATIENT_CLINIC_OR_DEPARTMENT_OTHER): Payer: Self-pay

## 2023-09-07 DIAGNOSIS — I2510 Atherosclerotic heart disease of native coronary artery without angina pectoris: Secondary | ICD-10-CM

## 2023-09-07 MED ORDER — EZETIMIBE 10 MG PO TABS
10.0000 mg | ORAL_TABLET | Freq: Every day | ORAL | 3 refills | Status: AC
  Filled 2023-10-06: qty 90, 90d supply, fill #0
  Filled 2024-02-01: qty 90, 90d supply, fill #1
  Filled 2024-05-16: qty 90, 90d supply, fill #2
  Filled 2024-09-06: qty 90, 90d supply, fill #3

## 2023-09-07 NOTE — Telephone Encounter (Signed)
 Patient has requested ezetimibe (ZETIA) 10 MG tablet. The order report says 09/14/23 after 365 doses. Prescription expired 07/28/23.

## 2023-09-12 ENCOUNTER — Ambulatory Visit: Payer: Medicare Other | Admitting: Gastroenterology

## 2023-09-12 ENCOUNTER — Other Ambulatory Visit: Payer: Self-pay

## 2023-09-12 ENCOUNTER — Encounter: Payer: Self-pay | Admitting: Gastroenterology

## 2023-09-12 ENCOUNTER — Other Ambulatory Visit (HOSPITAL_BASED_OUTPATIENT_CLINIC_OR_DEPARTMENT_OTHER): Payer: Self-pay

## 2023-09-12 VITALS — BP 126/91 | HR 80 | Temp 98.0°F | Ht 68.0 in | Wt 225.2 lb

## 2023-09-12 DIAGNOSIS — K219 Gastro-esophageal reflux disease without esophagitis: Secondary | ICD-10-CM

## 2023-09-12 DIAGNOSIS — K5900 Constipation, unspecified: Secondary | ICD-10-CM | POA: Insufficient documentation

## 2023-09-12 DIAGNOSIS — R1310 Dysphagia, unspecified: Secondary | ICD-10-CM

## 2023-09-12 DIAGNOSIS — R1319 Other dysphagia: Secondary | ICD-10-CM

## 2023-09-12 MED ORDER — POLYETHYLENE GLYCOL 3350 17 GM/SCOOP PO POWD
ORAL | 11 refills | Status: AC
  Filled 2023-09-12: qty 510, 15d supply, fill #0
  Filled 2023-09-23: qty 714, 42d supply, fill #1
  Filled 2023-10-06 – 2023-10-24 (×2): qty 510, 15d supply, fill #1

## 2023-09-12 MED ORDER — PANTOPRAZOLE SODIUM 40 MG PO TBEC
40.0000 mg | DELAYED_RELEASE_TABLET | Freq: Every day | ORAL | 3 refills | Status: DC
  Filled 2023-09-12: qty 90, 90d supply, fill #0
  Filled 2023-12-07: qty 90, 90d supply, fill #1
  Filled 2024-03-06: qty 90, 90d supply, fill #2
  Filled 2024-06-04: qty 90, 90d supply, fill #3

## 2023-09-12 NOTE — Patient Instructions (Addendum)
 Stop omeprazole . Start pantoprazole  40mg  daily before breakfast.   For breakthrough heartburn you can continue Mylanta or Maalox.  Other options include famotidine 20 mg daily, this can be bought over-the-counter. Monitor your reflux symptoms and swallowing concerns.  If no improvement over the next 6 to 8 weeks, please call and we will plan for either an upper endoscopy versus x-ray of your esophagus. Stop stool softener.  Start MiraLAX  taking 1 capful twice daily until you have a soft bowel movement.  Then continue once daily as needed.  Call if this is not effective. Plan on yearly follow-up visit unless your swallowing and reflux does not improve.

## 2023-09-12 NOTE — Progress Notes (Signed)
 GI Office Note    Referring Provider: Early, Camie BRAVO, NP Primary Care Physician:  Oris Camie BRAVO, NP  Primary Gastroenterologist: Carlin POUR. Cindie, DO   Chief Complaint   Chief Complaint  Patient presents with   Follow-up    History of Present Illness   Shaun [name] is a 68 y.o. male presenting today for yearly follow up. H/o IDA, improved and now followed by PCP. H/o GERD.   EGD/TCS 06/2018: low-grade narrowing Schatzki ring which was dilated.  Gastritis.  32 polyps removed from the colon, 10 were tubular adenomas.   Capsule endoscopy to complete GI work-up 2020 was normal.  On chronic Eliquis .   Colonoscopy 06/07/2021 with 4 small tubular adenomas removed.  Recommended recall 5 years.  Today: Every couple of weeks he has a flare and reflux.  Typically responds to liquid antacids.  On a weekly basis he has noted some issues with solid food dysphagia.  Denies any abdominal pain.  He has had more issues with constipation lately.  Stool softener no longer helping.  He uses MiraLAX  only occasionally.  Denies any melena or rectal bleeding.  Medications   Current Outpatient Medications  Medication Sig Dispense Refill   albuterol  (PROVENTIL ) (2.5 MG/3ML) 0.083% nebulizer solution Take 3 mLs (2.5 mg total) by nebulization every 6 (six) hours as needed for wheezing or shortness of breath. 75 mL 12   albuterol  (VENTOLIN  HFA) 108 (90 Base) MCG/ACT inhaler INHALE 2 PUFFS INTO THE LUNGS EVERY 6 HOURS AS NEEDED FOR WHEEZING OR SHORTNESS OF BREATH 6.7 g 1   apixaban  (ELIQUIS ) 5 MG TABS tablet Take 1 tablet (5 mg total) by mouth 2 (two) times daily. 180 tablet 1   atorvastatin  (LIPITOR ) 80 MG tablet Take 1 tablet (80 mg total) by mouth every evening. 90 tablet 2   azelastine  (ASTELIN ) 0.1 % nasal spray Place 1 spray into both nostrils 2 (two) times daily. Use in each nostril as directed 30 mL 12   buPROPion  (WELLBUTRIN  XL) 150 MG 24 hr tablet Take 3 tablets once daily (Patient taking  differently: Take 150 mg by mouth 3 (three) times daily.) 270 tablet 3   clindamycin  (CLEOCIN  T) 1 % lotion Apply topically daily.     docusate sodium  (COLACE) 100 MG capsule Take 1 capsule (100 mg total) by mouth daily as needed for mild constipation. (Patient taking differently: Take 100 mg by mouth daily.) 100 capsule 3   empagliflozin  (JARDIANCE ) 10 MG TABS tablet Take 1 tablet (10 mg total) by mouth daily before breakfast. 60 tablet 1   escitalopram  (LEXAPRO ) 20 MG tablet      ezetimibe  (ZETIA ) 10 MG tablet Take 1 tablet (10 mg total) by mouth daily. 90 tablet 3   ferrous sulfate  (FEROSUL) 325 (65 FE) MG tablet Take 1 tablet (325 mg total) by mouth 3 (three) times a week. 30 tablet 1   furosemide  (LASIX ) 40 MG tablet Take 1 tablet (40mg ) daily. May take 1/2 tablet (20mg ) additional if shortness of breath or swelling occurs. 120 tablet 2   HYDROcodone -acetaminophen  (NORCO/VICODIN) 5-325 MG tablet Take 1-2 tablets by mouth 2 (two) times daily as needed for moderate pain (pain score 4-6) or severe pain (pain score 7-10). 120 tablet 0   hydrOXYzine  (ATARAX ) 25 MG tablet Take 1 tablet (25 mg total) by mouth at bedtime and may repeat dose one time if needed. For sleep. 60 tablet 2   metFORMIN  (GLUCOPHAGE ) 500 MG tablet Take 1 tablet (500 mg total)  by mouth 2 (two) times daily before a meal. 180 tablet 3   methimazole  (TAPAZOLE ) 5 MG tablet Take 1 tablet (5 mg total) by mouth 4 days a week. 52 tablet 1   Metoprolol  Tartrate 75 MG TABS Take 75 mg by mouth 2 (two) times daily. 120 tablet 3   nitroGLYCERIN  (NITROSTAT ) 0.4 MG SL tablet Place 1 tablet (0.4 mg total) under the tongue every 5 (five) minutes as needed. 25 tablet 3   omeprazole  (PRILOSEC) 40 MG capsule Take 1 capsule by mouth once daily 30 minutes before breakfast 90 capsule 3   polyethylene glycol (MIRALAX  / GLYCOLAX ) 17 g packet Take 17 g by mouth daily as needed for mild constipation. 14 each 0   potassium chloride  SA (KLOR-CON  M) 20 MEQ  tablet Take 1 tablet (20 mEq total) by mouth daily. 90 tablet 3   rOPINIRole  (REQUIP ) 3 MG tablet Take 1 tablet by mouth nightly for restless leg, and a 1/2 tab during the day to control breakthrough symptoms 45 tablet 1   sildenafil  (VIAGRA ) 100 MG tablet Take 1 tablet (100 mg total) by mouth 30 minutes before activity 8 tablet 1   tiZANidine  (ZANAFLEX ) 4 MG tablet Take 1 tablet (4 mg total) by mouth 2 (two) times daily as needed for muscle spasms. 60 tablet 0   traZODone  (DESYREL ) 150 MG tablet Take 1 tablet (150 mg total) by mouth at bedtime. 90 tablet 1   Current Facility-Administered Medications  Medication Dose Route Frequency Provider Last Rate Last Admin   lidocaine  (XYLOCAINE ) 1 % (with pres) injection 5 mL  5 mL Other Once Kirsteins, Prentice BRAVO, MD        Allergies   Allergies as of 09/12/2023   (No Known Allergies)      Review of Systems   General: Negative for anorexia, weight loss, fever, chills, fatigue, weakness. ENT: Negative for hoarseness,  nasal congestion. See hpi CV: Negative for chest pain, angina, palpitations, dyspnea on exertion, peripheral edema.  Respiratory: Negative for dyspnea at rest, dyspnea on exertion, cough, sputum, wheezing.  GI: See history of present illness. GU:  Negative for dysuria, hematuria, urinary incontinence, urinary frequency, nocturnal urination.  Endo: Negative for unusual weight change.     Physical Exam   BP (!) 126/91 (BP Location: Right Arm, Patient Position: Sitting, Cuff Size: Large)   Pulse 80   Temp 98 F (36.7 C) (Oral)   Ht 5' 8 (1.727 m)   Wt 225 lb 3.2 oz (102.2 kg)   SpO2 96%   BMI 34.24 kg/m    General: Well-nourished, well-developed in no acute distress.  Eyes: No icterus. Mouth: Oropharyngeal mucosa moist and pink   Lungs: Clear to auscultation bilaterally.  Heart: Regular rate and rhythm, no murmurs rubs or gallops.  Abdomen: Bowel sounds are normal, nontender, nondistended, no hepatosplenomegaly or masses,   no abdominal bruits or hernia , no rebound or guarding.  Rectal: not performed  Extremities: No lower extremity edema. No clubbing or deformities. Neuro: Alert and oriented x 4   Skin: Warm and dry, no jaundice.   Psych: Alert and cooperative, normal mood and affect.  Labs   Lab Results  Component Value Date   NA 137 06/17/2023   CL 103 06/17/2023   K 4.1 06/17/2023   CO2 23 06/17/2023   BUN 18 06/17/2023   CREATININE 1.11 06/17/2023   GFRNONAA >60 06/17/2023   CALCIUM  9.4 06/17/2023   PHOS 3.3 08/14/2021   ALBUMIN  3.0 (L) 10/31/2022  GLUCOSE 177 (H) 06/17/2023   Lab Results  Component Value Date   ALT 25 10/31/2022   AST 21 10/31/2022   ALKPHOS 81 10/31/2022   BILITOT 1.0 10/31/2022   Lab Results  Component Value Date   WBC 10.0 06/17/2023   HGB 14.4 06/17/2023   HCT 42.8 06/17/2023   MCV 82.8 06/17/2023   PLT 163 06/17/2023   Lab Results  Component Value Date   TSH 0.84 03/31/2023   Lab Results  Component Value Date   IRON  62 02/14/2023   TIBC 396 02/14/2023   FERRITIN 25 (L) 02/14/2023    Imaging Studies   NM PET CT CARDIAC PERFUSION MULTI W/ABSOLUTE BLOODFLOW Result Date: 09/05/2023   Normal perfusion. Normal LVEF response to stress, no TID. MBF is abnormal. Findings either represent microvascular disease, but cannot exclude 3-vessel CAD. Given the patient history of diffuse mild to moderate non-obstructive CAD in 2018, would consider cardiac catheterization for clarification.   LV perfusion is normal. There is no evidence of ischemia. There is no evidence of infarction.   Rest left ventricular function is abnormal. Rest global function is mildly reduced. There were no regional wall motion abnormalities. Rest EF: 47%. Stress left ventricular function is normal. Stress EF: 51%. End diastolic cavity size is normal.   Myocardial blood flow was computed to be 0.83ml/g/min at rest and 1.27ml/g/min at stress. Global myocardial blood flow reserve was 1.59 and was  abnormal.   Coronary calcium  assessment not performed due to prior revascularization.   The study is normal. The study is low risk based on perfusion. CLINICAL DATA:  This over-read does not include interpretation of cardiac or coronary anatomy or pathology. The Cardiac PET CT interpretation by the cardiologist is attached. COMPARISON:  07/18/2023 FINDINGS: Cardiovascular: No significant vascular findings. Cardiomegaly. Three-vessel coronary artery calcifications. No pericardial effusion. Mediastinum/Nodes: No enlarged mediastinal, hilar, or axillary lymph nodes. Thyroid  gland, trachea, and esophagus demonstrate no significant findings. Lungs/Pleura: Emphysema.  No pleural effusion or pneumothorax. Upper Abdomen: No acute abnormality.  Hepatic steatosis. Musculoskeletal: No chest wall abnormality. No acute osseous findings. IMPRESSION: 1. Emphysema. 2. Cardiomegaly and coronary artery disease. 3. Hepatic steatosis. Emphysema (ICD10-J43.9). Electronically Signed   By: Marolyn JONETTA Jaksch M.D.   On: 09/05/2023 10:23  ECHOCARDIOGRAM COMPLETE Result Date: 08/14/2023    ECHOCARDIOGRAM REPORT   Patient Name:   Shaun [name] Date of Exam: 08/14/2023 Medical Rec #:  [id number]          Height:       68.0 in Accession #:    [id number]         Weight:       220.0 lb Date of Birth:  [birth year]         BSA:          2.128 m Patient Age:    63 years           BP:           129/79 mmHg Patient Gender: M                  HR:           92 bpm. Exam Location:  [address] Procedure: 2D Echo, Cardiac Doppler and Color Doppler Indications:    I50.32 Chronic diastolic (congestive) heart failure  History:        Patient has prior history of Echocardiogram examinations, most  recent 08/10/2021. CAD, COPD, Arrythmias:Atrial Fibrillation,                 Signs/Symptoms:Chest Pain; Risk Factors:Sleep Apnea,                 Hypertension, Diabetes and Dyslipidemia.  Sonographer:    Jon Hacker RCS Referring Phys:  LUM [facility] IMPRESSIONS  1. Left ventricular ejection fraction, by estimation, is 65 to 70%. The left ventricle has normal function. The left ventricle has no regional wall motion abnormalities. There is moderate left ventricular hypertrophy. Left ventricular diastolic parameters are indeterminate.  2. Right ventricular systolic function is normal. The right ventricular size is normal. Tricuspid regurgitation signal is inadequate for assessing PA pressure.  3. The mitral valve is normal in structure. Trivial mitral valve regurgitation. No evidence of mitral stenosis.  4. The aortic valve is tricuspid. Aortic valve regurgitation is not visualized. No aortic stenosis is present. FINDINGS  Left Ventricle: Left ventricular ejection fraction, by estimation, is 65 to 70%. The left ventricle has normal function. The left ventricle has no regional wall motion abnormalities. The left ventricular internal cavity size was normal in size. There is  moderate left ventricular hypertrophy. Left ventricular diastolic parameters are indeterminate. Right Ventricle: The right ventricular size is normal. No increase in right ventricular wall thickness. Right ventricular systolic function is normal. Tricuspid regurgitation signal is inadequate for assessing PA pressure. Left Atrium: Left atrial size was normal in size. Right Atrium: Right atrial size was normal in size. Pericardium: There is no evidence of pericardial effusion. Mitral Valve: The mitral valve is normal in structure. Trivial mitral valve regurgitation. No evidence of mitral valve stenosis. Tricuspid Valve: The tricuspid valve is normal in structure. Tricuspid valve regurgitation is trivial. Aortic Valve: The aortic valve is tricuspid. Aortic valve regurgitation is not visualized. No aortic stenosis is present. Pulmonic Valve: The pulmonic valve was not well visualized. Pulmonic valve regurgitation is trivial. Aorta: The aortic root and ascending aorta are  structurally normal, with no evidence of dilitation. IAS/Shunts: The interatrial septum was not well visualized.  LEFT VENTRICLE PLAX 2D LVIDd:         4.80 cm LVIDs:         2.80 cm LV PW:         1.40 cm LV IVS:        1.30 cm LVOT diam:     2.40 cm LV SV:         63 LV SV Index:   30 LVOT Area:     4.52 cm  RIGHT VENTRICLE RV S prime:     16.50 cm/s TAPSE (M-mode): 1.4 cm LEFT ATRIUM             Index        RIGHT ATRIUM           Index LA diam:        5.50 cm 2.58 cm/m   RA Area:     11.30 cm LA Vol (A2C):   76.3 ml 35.85 ml/m  RA Volume:   21.90 ml  10.29 ml/m LA Vol (A4C):   41.1 ml 19.31 ml/m LA Biplane Vol: 60.1 ml 28.24 ml/m  AORTIC VALVE LVOT Vmax:   80.87 cm/s LVOT Vmean:  50.133 cm/s LVOT VTI:    0.139 m  AORTA Ao Root diam: 3.60 cm Ao Asc diam:  3.60 cm  SHUNTS Systemic VTI:  0.14 m Systemic Diam: 2.40 cm Lonni Nanas MD Electronically signed by  Lonni Nanas MD Signature Date/Time: 08/14/2023/8:48:45 PM    Final     Assessment/Plan:   IDA: anemia resolved, followed by PCP  GERD/dysphagia: recent flares, wants to try medication change initially -Stop omeprazole , start pantoprazole  40 mg daily before breakfast -Can use Mylanta or Maalox as before or switch to famotidine 20 mg daily as needed. , Monitor reflux and swallowing symptoms/concerns, if no improvement over the next 6 to 8 weeks, would suggest that he consider EGD or at least barium esophagram.  He will call and give us  update around that time.  Constipation: -Stop stool softener -Start MiraLAX  1 capful twice daily until soft bowel movement, then continue once daily as needed.  Call if not effective.  Otherwise yearly follow-up unless symptoms do not improve.  Sonny RAMAN. Ezzard, MHS, PA-C [facility]

## 2023-09-15 ENCOUNTER — Ambulatory Visit: Payer: Medicare Other | Attending: Physician Assistant | Admitting: Emergency Medicine

## 2023-09-15 ENCOUNTER — Encounter: Payer: Self-pay | Admitting: Physician Assistant

## 2023-09-15 ENCOUNTER — Other Ambulatory Visit (HOSPITAL_BASED_OUTPATIENT_CLINIC_OR_DEPARTMENT_OTHER): Payer: Self-pay

## 2023-09-15 VITALS — BP 122/78 | HR 95 | Ht 68.0 in | Wt 223.2 lb

## 2023-09-15 DIAGNOSIS — R079 Chest pain, unspecified: Secondary | ICD-10-CM

## 2023-09-15 DIAGNOSIS — G4733 Obstructive sleep apnea (adult) (pediatric): Secondary | ICD-10-CM

## 2023-09-15 DIAGNOSIS — R0602 Shortness of breath: Secondary | ICD-10-CM | POA: Diagnosis not present

## 2023-09-15 DIAGNOSIS — E785 Hyperlipidemia, unspecified: Secondary | ICD-10-CM

## 2023-09-15 DIAGNOSIS — Z9861 Coronary angioplasty status: Secondary | ICD-10-CM

## 2023-09-15 DIAGNOSIS — I10 Essential (primary) hypertension: Secondary | ICD-10-CM | POA: Diagnosis not present

## 2023-09-15 DIAGNOSIS — I2510 Atherosclerotic heart disease of native coronary artery without angina pectoris: Secondary | ICD-10-CM

## 2023-09-15 DIAGNOSIS — I4821 Permanent atrial fibrillation: Secondary | ICD-10-CM

## 2023-09-15 DIAGNOSIS — I5032 Chronic diastolic (congestive) heart failure: Secondary | ICD-10-CM

## 2023-09-15 LAB — CBC

## 2023-09-15 MED ORDER — METOPROLOL TARTRATE 100 MG PO TABS
100.0000 mg | ORAL_TABLET | Freq: Two times a day (BID) | ORAL | 3 refills | Status: DC
  Filled 2023-09-15: qty 180, 90d supply, fill #0
  Filled 2023-12-07: qty 180, 90d supply, fill #1
  Filled 2024-03-06: qty 180, 90d supply, fill #2
  Filled 2024-06-04: qty 180, 90d supply, fill #3

## 2023-09-15 NOTE — Progress Notes (Signed)
Cardiology Office Note:    Date:  09/15/2023  ID:  Shaun [name], DOB Apr 14, 1956, MRN [id number] PCP: [name], NP  [facility] Providers Cardiologist:  [name], MD       Patient Profile:     Shaun [name]. Hollan 68 y/o male with history of permanent atrial fibrillation, OSA, hyperlipidemia, coronary artery disease, chronic diastolic congestive heart failure, COPD, type 2 diabetes, and hypertension.  He is s/p PCI to LCx in November 2016.  In June 2018 he was seen by afib clinic and given his longstanding atrial fibrillation and left atrial enlargement it was felt that rate control and anticoagulation would be his goal.  LHC on 05/2017 showed distal Cx lesion as 30% stenosed, patent stent in distal LCx.  Echo 05/2017 with LVEF 60 to 65%, no RWMA, moderate LVH, LV function normal, with atrium severely dilated.  Monitor 04/18/2019 showed atrial fibrillation with PVCs or aberrantly conducted beats, rate controlled.  Abdominal CT July 2022 showed no aneurysm.  Echo 08/17/2021 showed normal LV function, moderate LVH, mild left atrial enlargement.   He was seen in the ED on 06/17/2023 with complaints of trouble breathing and chest pain described as squeezing/tightness.  He did have URI symptoms.  COPD exacerbation was suspected he received DuoNebs and Solu-Medrol.  He was prescribed prednisone, azithromycin, albuterol. He did note his chest tightness reminded him of the symptoms he had prior to stenting in the past. Troponin 3, 3. Chest x-ray unremarkable, BNP 62.7.   He was last seen in clinic on 07/10/2023. 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  He had also noted ongoing SOB, DOE, worsening orthopnea.  Jardiance 10 mg was added to his regimen at the time.  Echocardiogram was ordered and completed on 08/14/2023 showing LVEF 65 to 70%, no RWMA, moderate LVH, RV SF normal, trivial mitral valve regurgitation.  PET stress  was ordered and completed on 07/10/2023 showing normal perfusion, normal LV EF, no evidence of ischemia, no evidence of infarction, abnormal MBF with findings either representing microvascular disease but cannot exclude three-vessel CAD.      History of Present Illness:  Discussed the use of AI scribe software for clinical note transcription with the patient, who gave verbal consent to proceed.  Shaun [name] is a 68 y.o. male who returns for follow-up chest pain, shortness of breath, PET stress results.  Today he presents with persistent chest pain and shortness of breath. Despite starting Jardiance he has had no improvement in his SOB, DOE, orthopnea.  He experiences shortness of breath during short walks such as taking the trash out where he has to stop and rest.  His sleep is frequently interrupted, attributing this to both discomfort and breathlessness.  He has had two episodes of chest pain since his last visit. The first episode, which lasted about two and a half hours, woke him from sleep and was severe enough to require two doses of nitroglycerin. He had radiation of his pain to his neck and the pain was described as a pressure sensation.  The second episode, described as a feeling of tightness, occurred while he was in the kitchen and lasted approximately 45 minutes.  He has been without any palpitations or tachycardia.    Review of Systems  Constitutional: Negative for weight gain and weight loss.  Cardiovascular:  Positive for chest pain, dyspnea on exertion and orthopnea. Negative for claudication, irregular heartbeat, leg swelling,  near-syncope, palpitations, paroxysmal nocturnal dyspnea and syncope.  Respiratory:  Positive for shortness of breath and sleep disturbances due to breathing. Negative for cough and hemoptysis.   Gastrointestinal:  Negative for abdominal pain, hematochezia and melena.  Genitourinary:  Negative for hematuria.  Neurological:  Negative for dizziness and  light-headedness.     See HPI     Home Medications:    Prior to Admission medications   Medication Sig Start Date End Date Taking? Authorizing Provider  albuterol (PROVENTIL) (2.5 MG/3ML) 0.083% nebulizer solution Take 3 mLs (2.5 mg total) by nebulization every 6 (six) hours as needed for wheezing or shortness of breath. 06/17/23   Alvira Monday, MD  albuterol (VENTOLIN HFA) 108 (90 Base) MCG/ACT inhaler INHALE 2 PUFFS INTO THE LUNGS EVERY 6 HOURS AS NEEDED FOR WHEEZING OR SHORTNESS OF BREATH 01/12/23   Tysinger, Kermit Balo, PA-C  apixaban (ELIQUIS) 5 MG TABS tablet Take 1 tablet (5 mg total) by mouth 2 (two) times daily. 02/15/23   Lewayne Bunting, MD  atorvastatin (LIPITOR) 80 MG tablet Take 1 tablet (80 mg total) by mouth every evening. 03/14/23   Lewayne Bunting, MD  azelastine (ASTELIN) 0.1 % nasal spray Place 1 spray into both nostrils 2 (two) times daily. Use in each nostril as directed 10/12/21   Janeece Agee, NP  buPROPion (WELLBUTRIN XL) 150 MG 24 hr tablet Take 3 tablets once daily Patient taking differently: Take 150 mg by mouth 3 (three) times daily. 07/29/22   [name], NP  clindamycin (CLEOCIN T) 1 % lotion Apply topically daily. 05/15/23   [provider]  docusate sodium (COLACE) 100 MG capsule Take 1 capsule (100 mg total) by mouth daily as needed for mild constipation. Patient taking differently: Take 100 mg by mouth daily. 07/29/22   [name], NP  empagliflozin (JARDIANCE) 10 MG TABS tablet Take 1 tablet (10 mg total) by mouth daily before breakfast. 07/10/23   Denyce Robert, NP  escitalopram (LEXAPRO) 20 MG tablet     [provider]  ezetimibe (ZETIA) 10 MG tablet Take 1 tablet (10 mg total) by mouth daily. 09/07/23 09/06/24  Lewayne Bunting, MD  ferrous sulfate (FEROSUL) 325 (65 FE) MG tablet Take 1 tablet (325 mg total) by mouth 3 (three) times a week. 08/11/23   [name], NP  furosemide (LASIX) 40 MG tablet Take 1 tablet (40mg )  daily. May take 1/2 tablet (20mg ) additional if shortness of breath or swelling occurs. 02/14/23   [name], NP  HYDROcodone-acetaminophen (NORCO/VICODIN) 5-325 MG tablet Take 1-2 tablets by mouth 2 (two) times daily as needed for moderate pain (pain score 4-6) or severe pain (pain score 7-10). 08/21/23   [name], NP  hydrOXYzine (ATARAX) 25 MG tablet Take 1 tablet (25 mg total) by mouth at bedtime and may repeat dose one time if needed. For sleep. 04/24/23   [name], NP  metFORMIN (GLUCOPHAGE) 500 MG tablet Take 1 tablet (500 mg total) by mouth 2 (two) times daily before a meal. 07/29/22   Early, Sung Amabile, NP  methimazole (TAPAZOLE) 5 MG tablet Take 1 tablet (5 mg total) by mouth 4 days a week. 07/12/23   [name], Konrad Dolores, MD  Metoprolol Tartrate 75 MG TABS Take 75 mg by mouth 2 (two) times daily. 04/05/23   [name], NP  nitroGLYCERIN (NITROSTAT) 0.4 MG SL tablet Place 1 tablet (0.4 mg total) under the tongue every 5 (five) minutes as needed.  07/29/22   [name], NP  pantoprazole (PROTONIX) 40 MG tablet Take 1 tablet (40 mg total) by mouth daily before breakfast. 09/12/23   Tiffany Kocher, PA-C  polyethylene glycol powder (GLYCOLAX/MIRALAX) 17 GM/SCOOP powder Take one capful (17g) twice daily until soft stool, then continue one capful once daily as needed. 09/12/23   Tiffany Kocher, PA-C  potassium chloride SA (KLOR-CON M) 20 MEQ tablet Take 1 tablet (20 mEq total) by mouth daily. 04/26/23   Lewayne Bunting, MD  rOPINIRole (REQUIP) 3 MG tablet Take 1 tablet by mouth nightly for restless leg, and a 1/2 tab during the day to control breakthrough symptoms 08/31/23   Early, Sung Amabile, NP  sildenafil (VIAGRA) 100 MG tablet Take 1 tablet (100 mg total) by mouth 30 minutes before activity 08/31/23   Early, Sung Amabile, NP  tiZANidine (ZANAFLEX) 4 MG tablet Take 1 tablet (4 mg total) by mouth 2 (two) times daily as needed for muscle spasms. 08/15/23   [name], NP  traZODone (DESYREL)  150 MG tablet Take 1 tablet (150 mg total) by mouth at bedtime. 05/02/23   [name], NP   Studies Reviewed:       PET Stress 09/05/2023   Normal perfusion. Normal LVEF response to stress, no TID. MBF is abnormal. Findings either represent microvascular disease, but cannot exclude 3-vessel CAD. Given the patient history of diffuse mild to moderate non-obstructive CAD in 2018, would consider cardiac catheterization for clarification.   LV perfusion is normal. There is no evidence of ischemia. There is no evidence of infarction.   Rest left ventricular function is abnormal. Rest global function is mildly reduced. There were no regional wall motion abnormalities. Rest EF: 47%. Stress left ventricular function is normal. Stress EF: 51%. End diastolic cavity size is normal.   Myocardial blood flow was computed to be 0.39ml/g/min at rest and 1.22ml/g/min at stress. Global myocardial blood flow reserve was 1.59 and was abnormal.   Coronary calcium assessment not performed due to prior revascularization.   The study is normal. The study is low risk based on perfusion.  Echocardiogram 08/14/2023 1. Left ventricular ejection fraction, by estimation, is 65 to 70%. The  left ventricle has normal function. The left ventricle has no regional  wall motion abnormalities. There is moderate left ventricular hypertrophy.  Left ventricular diastolic  parameters are indeterminate.   2. Right ventricular systolic function is normal. The right ventricular  size is normal. Tricuspid regurgitation signal is inadequate for assessing  PA pressure.   3. The mitral valve is normal in structure. Trivial mitral valve  regurgitation. No evidence of mitral stenosis.   4. The aortic valve is tricuspid. Aortic valve regurgitation is not  visualized. No aortic stenosis is present.   Heart catheterization 06/08/2017 Conclusions: Mild to moderate non-obstructive coronary artery disease, as detailed below. LAD and LCx disease  is not hemodynamically significant by FFR. Patent stent in the distal LCx. Normal left ventricular contraction and filling pressure. Severe pain at the end of diagnostic catheterization without clear etiology. EKG's were unchanged from baseline, and pain resolved after the patient was able to sit up.   Recommendations: Medical management and secondary prevention. Consider evaluation for non-cardiac causes of chest pain.   Risk Assessment/Calculations:    CHA2DS2-VASc Score = 5   This indicates a 7.2% annual risk of stroke. The patient's score is based upon: CHF History: 1 HTN History: 1 Diabetes History: 1 Stroke History: 0 Vascular Disease History:  1 Age Score: 1 Gender Score: 0            Physical Exam:   VS:  BP 122/78   Pulse 95   Ht 5\' 8"  (1.727 m)   Wt 223 lb 3.2 oz (101.2 kg)   SpO2 96%   BMI 33.94 kg/m    Wt Readings from Last 3 Encounters:  09/15/23 223 lb 3.2 oz (101.2 kg)  09/12/23 225 lb 3.2 oz (102.2 kg)  08/11/23 220 lb (99.8 kg)    Constitutional:      Appearance: Normal and healthy appearance. Not in distress.  Neck:     Vascular: JVD normal.  Pulmonary:     Effort: Pulmonary effort is normal.     Breath sounds: Normal breath sounds.  Chest:     Chest wall: Not tender to palpatation.  Cardiovascular:     PMI at left midclavicular line. Normal rate. Irregularly irregular rhythm. Normal S1. Normal S2.      Murmurs: There is no murmur.     No gallop.  No click. No rub.  Pulses:    Intact distal pulses.  Edema:    Peripheral edema absent.  Musculoskeletal: Normal range of motion.     Cervical back: Normal range of motion and neck supple. Skin:    General: Skin is warm and dry.  Neurological:     General: No focal deficit present.     Mental Status: Alert, oriented to person, place, and time and oriented to person, place and time.  Psychiatric:        Mood and Affect: Mood and affect normal.        Behavior: Behavior is cooperative.         Thought Content: Thought content normal.        Assessment and Plan:  Coronary artery disease / Chest pain S/p DES to dLCx in 2018 PET stress 07/10/2023 showed no evidence ischemia, no evidence of infarction, no TID. Did show abnormal MBF with findings possibly representing microvascular disease and cannot exclude three-vessel CAD. Rest EF 47% and Stress EF 51% -Has had 2 episodes of chest pain since last month with 1 episode requiring x2 nitroglycerin as noted above -Plan for R/L Heart Catheterization as PET stress cannot exclude macrovascular disease and with ongoing symptoms of chest pain, SOB, DOE, orthopnea.  (Spoke with Dr. Bufford Buttner who agrees with plan) -Plan to increase metoprolol tartrate to 100 mg twice daily (from 75 mg BID) for antianginal benefits given possibility of microvascular disease. Cannot use Imdur as pt is on Sildenafil.  -Not on ASA due to [facility] -Increase Metoprolol Tartrate to 100 mg BID & Continue Atorvastatin 80 mg daily, Zetia 10 mg day, Nitroglycerin 0.4 mg prn   Permanent atrial fibrillation Rate controlled Afib today with HR 95bpm.  Denies palpitations and fast heart rates. No bleeding concerns -Plan to increase metoprolol tartrate to 100 mg twice daily as noted above -Continue Eliquis 5 mg twice daily.  (Hold 2 days prior to cardiac catheterization)  CHA2DS2-VASc Score = 5 [CHF History: 1, HTN History: 1, Diabetes History: 1, Stroke History: 0, Vascular Disease History: 1, Age Score: 1, Gender Score: 0].  Therefore, the patient's annual risk of stroke is 7.2 %.      Chronic diastolic heart failure Echo 08/14/2023 with LVEF 65 to 70%, no RWMA, moderate LVH, RV SF normal He is euvolemic and well compensated on exam today.  Weight stable with 1 pound weight loss since last visit He does continue  to have SOB, DOE, orthopnea.  Echocardiogram in December was overall unremarkable. -Plan for R/L heart catheterization as noted above -Continue Jardiance 10 mg daily (hold  3 days prior to catheterization) and Lasix 40 mg daily -Increase metoprolol tartrate to 100 mg twice daily  Hypertension BP today under excellent control at 122/78 -Continue current regimen  Hyperlipidemia LDL 70 on 07/11/2023.  Currently at goal -Continue atorvastatin 80 mg daily and Zetia 10 mg daily  OSA Reports continue compliance with CPAP         Informed Consent   Shared Decision Making/Informed Consent The risks [stroke (1 in 1000), death (1 in 1000), kidney failure [usually temporary] (1 in 500), bleeding (1 in 200), allergic reaction [possibly serious] (1 in 200)], benefits (diagnostic support and management of coronary artery disease) and alternatives of a cardiac catheterization were discussed in detail with Mr. Lowrie and he is willing to proceed.     Dispo:  Follow-up in 2-4 weeks  Signed, Denyce Robert, NP

## 2023-09-15 NOTE — H&P (View-Only) (Signed)
Cardiology Office Note:    Date:  09/15/2023  ID:  Shaun [name], DOB Apr 14, 1956, MRN [id number] PCP: Shaun Eth, NP  [facility] Providers Cardiologist:  Shaun Millers, MD       Patient Profile:     Shaun [name] 68 y/o male with history of permanent atrial fibrillation, OSA, hyperlipidemia, coronary artery disease, chronic diastolic congestive heart failure, COPD, type 2 diabetes, and hypertension.  He is s/p PCI to LCx in November 2016.  In June 2018 he was seen by afib clinic and given his longstanding atrial fibrillation and left atrial enlargement it was felt that rate control and anticoagulation would be his goal.  LHC on 05/2017 showed distal Cx lesion as 30% stenosed, patent stent in distal LCx.  Echo 05/2017 with LVEF 60 to 65%, no RWMA, moderate LVH, LV function normal, with atrium severely dilated.  Monitor 04/18/2019 showed atrial fibrillation with PVCs or aberrantly conducted beats, rate controlled.  Abdominal CT July 2022 showed no aneurysm.  Echo 08/17/2021 showed normal LV function, moderate LVH, mild left atrial enlargement.   He was seen in the ED on 06/17/2023 with complaints of trouble breathing and chest pain described as squeezing/tightness.  He did have URI symptoms.  COPD exacerbation was suspected he received DuoNebs and Solu-Medrol.  He was prescribed prednisone, azithromycin, albuterol. He did note his chest tightness reminded him of the symptoms he had prior to stenting in the past. Troponin 3, 3. Chest x-ray unremarkable, BNP 62.7.   He was last seen in clinic on 07/10/2023. 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  He had also noted ongoing SOB, DOE, worsening orthopnea.  Jardiance 10 mg was added to his regimen at the time.  Echocardiogram was ordered and completed on 08/14/2023 showing LVEF 65 to 70%, no RWMA, moderate LVH, RV SF normal, trivial mitral valve regurgitation.  PET stress  was ordered and completed on 07/10/2023 showing normal perfusion, normal LV EF, no evidence of ischemia, no evidence of infarction, abnormal MBF with findings either representing microvascular disease but cannot exclude three-vessel CAD.      History of Present Illness:  Discussed the use of AI scribe software for clinical note transcription with the patient, who gave verbal consent to proceed.  Shaun [name] is a 68 y.o. male who returns for follow-up chest pain, shortness of breath, PET stress results.  Today he presents with persistent chest pain and shortness of breath. Despite starting Jardiance he has had no improvement in his SOB, DOE, orthopnea.  He experiences shortness of breath during short walks such as taking the trash out where he has to stop and rest.  His sleep is frequently interrupted, attributing this to both discomfort and breathlessness.  He has had two episodes of chest pain since his last visit. The first episode, which lasted about two and a half hours, woke him from sleep and was severe enough to require two doses of nitroglycerin. He had radiation of his pain to his neck and the pain was described as a pressure sensation.  The second episode, described as a feeling of tightness, occurred while he was in the kitchen and lasted approximately 45 minutes.  He has been without any palpitations or tachycardia.    Review of Systems  Constitutional: Negative for weight gain and weight loss.  Cardiovascular:  Positive for chest pain, dyspnea on exertion and orthopnea. Negative for claudication, irregular heartbeat, leg swelling,  near-syncope, palpitations, paroxysmal nocturnal dyspnea and syncope.  Respiratory:  Positive for shortness of breath and sleep disturbances due to breathing. Negative for cough and hemoptysis.   Gastrointestinal:  Negative for abdominal pain, hematochezia and melena.  Genitourinary:  Negative for hematuria.  Neurological:  Negative for dizziness and  light-headedness.     See HPI     Home Medications:    Prior to Admission medications   Medication Sig Start Date End Date Taking? Authorizing Provider  albuterol (PROVENTIL) (2.5 MG/3ML) 0.083% nebulizer solution Take 3 mLs (2.5 mg total) by nebulization every 6 (six) hours as needed for wheezing or shortness of breath. 06/17/23   Shaun Monday, MD  albuterol (VENTOLIN HFA) 108 (90 Base) MCG/ACT inhaler INHALE 2 PUFFS INTO THE LUNGS EVERY 6 HOURS AS NEEDED FOR WHEEZING OR SHORTNESS OF BREATH 01/12/23   Tysinger, Kermit Balo, PA-C  apixaban (ELIQUIS) 5 MG TABS tablet Take 1 tablet (5 mg total) by mouth 2 (two) times daily. 02/15/23   Shaun Bunting, MD  atorvastatin (LIPITOR) 80 MG tablet Take 1 tablet (80 mg total) by mouth every evening. 03/14/23   Shaun Bunting, MD  azelastine (ASTELIN) 0.1 % nasal spray Place 1 spray into both nostrils 2 (two) times daily. Use in each nostril as directed 10/12/21   Shaun Agee, NP  buPROPion (WELLBUTRIN XL) 150 MG 24 hr tablet Take 3 tablets once daily Patient taking differently: Take 150 mg by mouth 3 (three) times daily. 07/29/22   Shaun Eth, NP  clindamycin (CLEOCIN T) 1 % lotion Apply topically daily. 05/15/23   [provider]  docusate sodium (COLACE) 100 MG capsule Take 1 capsule (100 mg total) by mouth daily as needed for mild constipation. Patient taking differently: Take 100 mg by mouth daily. 07/29/22   Shaun Eth, NP  empagliflozin (JARDIANCE) 10 MG TABS tablet Take 1 tablet (10 mg total) by mouth daily before breakfast. 07/10/23   Shaun Robert, NP  escitalopram (LEXAPRO) 20 MG tablet     [provider]  ezetimibe (ZETIA) 10 MG tablet Take 1 tablet (10 mg total) by mouth daily. 09/07/23 09/06/24  Shaun Bunting, MD  ferrous sulfate (FEROSUL) 325 (65 FE) MG tablet Take 1 tablet (325 mg total) by mouth 3 (three) times a week. 08/11/23   Shaun Eth, NP  furosemide (LASIX) 40 MG tablet Take 1 tablet (40mg )  daily. May take 1/2 tablet (20mg ) additional if shortness of breath or swelling occurs. 02/14/23   Shaun Eth, NP  HYDROcodone-acetaminophen (NORCO/VICODIN) 5-325 MG tablet Take 1-2 tablets by mouth 2 (two) times daily as needed for moderate pain (pain score 4-6) or severe pain (pain score 7-10). 08/21/23   Shaun Eth, NP  hydrOXYzine (ATARAX) 25 MG tablet Take 1 tablet (25 mg total) by mouth at bedtime and may repeat dose one time if needed. For sleep. 04/24/23   Shaun Eth, NP  metFORMIN (GLUCOPHAGE) 500 MG tablet Take 1 tablet (500 mg total) by mouth 2 (two) times daily before a meal. 07/29/22   Early, Sung Amabile, NP  methimazole (TAPAZOLE) 5 MG tablet Take 1 tablet (5 mg total) by mouth 4 days a week. 07/12/23   [name], Konrad Dolores, MD  Metoprolol Tartrate 75 MG TABS Take 75 mg by mouth 2 (two) times daily. 04/05/23   Shaun Eth, NP  nitroGLYCERIN (NITROSTAT) 0.4 MG SL tablet Place 1 tablet (0.4 mg total) under the tongue every 5 (five) minutes as needed.  07/29/22   Shaun Eth, NP  pantoprazole (PROTONIX) 40 MG tablet Take 1 tablet (40 mg total) by mouth daily before breakfast. 09/12/23   Tiffany Kocher, PA-C  polyethylene glycol powder (GLYCOLAX/MIRALAX) 17 GM/SCOOP powder Take one capful (17g) twice daily until soft stool, then continue one capful once daily as needed. 09/12/23   Tiffany Kocher, PA-C  potassium chloride SA (KLOR-CON M) 20 MEQ tablet Take 1 tablet (20 mEq total) by mouth daily. 04/26/23   Shaun Bunting, MD  rOPINIRole (REQUIP) 3 MG tablet Take 1 tablet by mouth nightly for restless leg, and a 1/2 tab during the day to control breakthrough symptoms 08/31/23   Early, Sung Amabile, NP  sildenafil (VIAGRA) 100 MG tablet Take 1 tablet (100 mg total) by mouth 30 minutes before activity 08/31/23   Early, Sung Amabile, NP  tiZANidine (ZANAFLEX) 4 MG tablet Take 1 tablet (4 mg total) by mouth 2 (two) times daily as needed for muscle spasms. 08/15/23   Shaun Eth, NP  traZODone (DESYREL)  150 MG tablet Take 1 tablet (150 mg total) by mouth at bedtime. 05/02/23   Shaun Eth, NP   Studies Reviewed:       PET Stress 09/05/2023   Normal perfusion. Normal LVEF response to stress, no TID. MBF is abnormal. Findings either represent microvascular disease, but cannot exclude 3-vessel CAD. Given the patient history of diffuse mild to moderate non-obstructive CAD in 2018, would consider cardiac catheterization for clarification.   LV perfusion is normal. There is no evidence of ischemia. There is no evidence of infarction.   Rest left ventricular function is abnormal. Rest global function is mildly reduced. There were no regional wall motion abnormalities. Rest EF: 47%. Stress left ventricular function is normal. Stress EF: 51%. End diastolic cavity size is normal.   Myocardial blood flow was computed to be 0.39ml/g/min at rest and 1.22ml/g/min at stress. Global myocardial blood flow reserve was 1.59 and was abnormal.   Coronary calcium assessment not performed due to prior revascularization.   The study is normal. The study is low risk based on perfusion.  Echocardiogram 08/14/2023 1. Left ventricular ejection fraction, by estimation, is 65 to 70%. The  left ventricle has normal function. The left ventricle has no regional  wall motion abnormalities. There is moderate left ventricular hypertrophy.  Left ventricular diastolic  parameters are indeterminate.   2. Right ventricular systolic function is normal. The right ventricular  size is normal. Tricuspid regurgitation signal is inadequate for assessing  PA pressure.   3. The mitral valve is normal in structure. Trivial mitral valve  regurgitation. No evidence of mitral stenosis.   4. The aortic valve is tricuspid. Aortic valve regurgitation is not  visualized. No aortic stenosis is present.   Heart catheterization 06/08/2017 Conclusions: Mild to moderate non-obstructive coronary artery disease, as detailed below. LAD and LCx disease  is not hemodynamically significant by FFR. Patent stent in the distal LCx. Normal left ventricular contraction and filling pressure. Severe pain at the end of diagnostic catheterization without clear etiology. EKG's were unchanged from baseline, and pain resolved after the patient was able to sit up.   Recommendations: Medical management and secondary prevention. Consider evaluation for non-cardiac causes of chest pain.   Risk Assessment/Calculations:    CHA2DS2-VASc Score = 5   This indicates a 7.2% annual risk of stroke. The patient's score is based upon: CHF History: 1 HTN History: 1 Diabetes History: 1 Stroke History: 0 Vascular Disease History:  1 Age Score: 1 Gender Score: 0            Physical Exam:   VS:  BP 122/78   Pulse 95   Ht 5\' 8"  (1.727 m)   Wt 223 lb 3.2 oz (101.2 kg)   SpO2 96%   BMI 33.94 kg/m    Wt Readings from Last 3 Encounters:  09/15/23 223 lb 3.2 oz (101.2 kg)  09/12/23 225 lb 3.2 oz (102.2 kg)  08/11/23 220 lb (99.8 kg)    Constitutional:      Appearance: Normal and healthy appearance. Not in distress.  Neck:     Vascular: JVD normal.  Pulmonary:     Effort: Pulmonary effort is normal.     Breath sounds: Normal breath sounds.  Chest:     Chest wall: Not tender to palpatation.  Cardiovascular:     PMI at left midclavicular line. Normal rate. Irregularly irregular rhythm. Normal S1. Normal S2.      Murmurs: There is no murmur.     No gallop.  No click. No rub.  Pulses:    Intact distal pulses.  Edema:    Peripheral edema absent.  Musculoskeletal: Normal range of motion.     Cervical back: Normal range of motion and neck supple. Skin:    General: Skin is warm and dry.  Neurological:     General: No focal deficit present.     Mental Status: Alert, oriented to person, place, and time and oriented to person, place and time.  Psychiatric:        Mood and Affect: Mood and affect normal.        Behavior: Behavior is cooperative.         Thought Content: Thought content normal.        Assessment and Plan:  Coronary artery disease / Chest pain S/p DES to dLCx in 2018 PET stress 07/10/2023 showed no evidence ischemia, no evidence of infarction, no TID. Did show abnormal MBF with findings possibly representing microvascular disease and cannot exclude three-vessel CAD. Rest EF 47% and Stress EF 51% -Has had 2 episodes of chest pain since last month with 1 episode requiring x2 nitroglycerin as noted above -Plan for R/L Heart Catheterization as PET stress cannot exclude macrovascular disease and with ongoing symptoms of chest pain, SOB, DOE, orthopnea.  (Spoke with Dr. Bufford Buttner who agrees with plan) -Plan to increase metoprolol tartrate to 100 mg twice daily (from 75 mg BID) for antianginal benefits given possibility of microvascular disease. Cannot use Imdur as pt is on Sildenafil.  -Not on ASA due to [facility] -Increase Metoprolol Tartrate to 100 mg BID & Continue Atorvastatin 80 mg daily, Zetia 10 mg day, Nitroglycerin 0.4 mg prn   Permanent atrial fibrillation Rate controlled Afib today with HR 95bpm.  Denies palpitations and fast heart rates. No bleeding concerns -Plan to increase metoprolol tartrate to 100 mg twice daily as noted above -Continue Eliquis 5 mg twice daily.  (Hold 2 days prior to cardiac catheterization)  CHA2DS2-VASc Score = 5 [CHF History: 1, HTN History: 1, Diabetes History: 1, Stroke History: 0, Vascular Disease History: 1, Age Score: 1, Gender Score: 0].  Therefore, the patient's annual risk of stroke is 7.2 %.      Chronic diastolic heart failure Echo 08/14/2023 with LVEF 65 to 70%, no RWMA, moderate LVH, RV SF normal He is euvolemic and well compensated on exam today.  Weight stable with 1 pound weight loss since last visit He does continue  to have SOB, DOE, orthopnea.  Echocardiogram in December was overall unremarkable. -Plan for R/L heart catheterization as noted above -Continue Jardiance 10 mg daily (hold  3 days prior to catheterization) and Lasix 40 mg daily -Increase metoprolol tartrate to 100 mg twice daily  Hypertension BP today under excellent control at 122/78 -Continue current regimen  Hyperlipidemia LDL 70 on 07/11/2023.  Currently at goal -Continue atorvastatin 80 mg daily and Zetia 10 mg daily  OSA Reports continue compliance with CPAP         Informed Consent   Shared Decision Making/Informed Consent The risks [stroke (1 in 1000), death (1 in 1000), kidney failure [usually temporary] (1 in 500), bleeding (1 in 200), allergic reaction [possibly serious] (1 in 200)], benefits (diagnostic support and management of coronary artery disease) and alternatives of a cardiac catheterization were discussed in detail with Mr. Lowrie and he is willing to proceed.     Dispo:  Follow-up in 2-4 weeks  Signed, Shaun Robert, NP

## 2023-09-15 NOTE — Patient Instructions (Addendum)
Medication Instructions:  INCREASE METOPROLOL TARTRATE TO 100 MG TWICE DAILY.  *If you need a refill on your cardiac medications before your next appointment, please call your pharmacy*   Lab Work: CBC AND BMET TODAY. If you have labs (blood work) drawn today and your tests are completely normal, you will receive your results only by: MyChart Message (if you have MyChart) OR A paper copy in the mail If you have any lab test that is abnormal or we need to change your treatment, we will call you to review the results.   Testing/Procedures: Your physician has requested that you have a cardiac catheterization. Cardiac catheterization is used to diagnose and/or treat various heart conditions. Doctors may recommend this procedure for a number of different reasons. The most common reason is to evaluate chest pain. Chest pain can be a symptom of coronary artery disease (CAD), and cardiac catheterization can show whether plaque is narrowing or blocking your heart's arteries. This procedure is also used to evaluate the valves, as well as measure the blood flow and oxygen levels in different parts of your heart. For further information please visit [email or web address] Please follow instruction sheet, as given.    Follow-Up: At [facility], you and your health needs are our priority.  As part of our continuing mission to provide you with exceptional heart care, we have created designated Provider Care Teams.  These Care Teams include your primary Cardiologist (physician) and Advanced Practice Providers (APPs -  Physician Assistants and Nurse Practitioners) who all work together to provide you with the care you need, when you need it.   Your next appointment:   2-4  week(s) after procedure  Provider:   Azalee Course, PA     Other Instructions       Cardiac/Peripheral Catheterization   You are scheduled for a Cardiac Catheterization on Tuesday, January 21 with Dr. Verne Carrow.  1. Please arrive at the [facility] (Main Entrance A) at [facility]: [address] [place], [place] [address] at 5:30 AM (This time is 2 hour(s) before your procedure to ensure your preparation).   Free valet parking service is available. You will check in at ADMITTING. The support person will be asked to wait in the waiting room.  It is OK to have someone drop you off and come back when you are ready to be discharged.        Special note: Every effort is made to have your procedure done on time. Please understand that emergencies sometimes delay scheduled procedures.  2. Diet: Do not eat solid foods after midnight.  You may have clear liquids until 5 AM the day of the procedure.  3. Labs: You will need to have blood drawn on today 09/15/23  4. Medication instructions in preparation for your procedure:   Contrast Allergy: No  HOLD ELIQUIS FOR 2 DAY PRIOR  HOLD LASIX FOR 1 DAY PRIOR  HOLD JARDIANCE 3 DAYS PRIOR  Do not take Diabetes Med Glucophage (Metformin) on the day of the procedure and HOLD 48 HOURS AFTER THE PROCEDURE.  On the morning of your procedure, take Aspirin 81 mg and any morning medicines NOT listed above.  You may use sips of water.  5. Plan to go home the same day, you will only stay overnight if medically necessary. 6. You MUST have a responsible adult to drive you home. 7. An adult MUST be with you the first 24 hours after you arrive home. 8. Bring a  current list of your medications, and the last time and date medication taken. 9. Bring ID and current insurance cards. 10.Please wear clothes that are easy to get on and off and wear slip-on shoes.  Thank you for allowing [place] to care for you!   -- [facility]

## 2023-09-16 LAB — BASIC METABOLIC PANEL
BUN/Creatinine Ratio: 18 (ref 10–24)
BUN: 17 mg/dL (ref 8–27)
CO2: 19 mmol/L — ABNORMAL LOW (ref 20–29)
Calcium: 9.3 mg/dL (ref 8.6–10.2)
Chloride: 101 mmol/L (ref 96–106)
Creatinine, Ser: 0.95 mg/dL (ref 0.76–1.27)
Glucose: 161 mg/dL — ABNORMAL HIGH (ref 70–99)
Potassium: 4.3 mmol/L (ref 3.5–5.2)
Sodium: 140 mmol/L (ref 134–144)
eGFR: 88 mL/min/{1.73_m2} (ref >59)

## 2023-09-16 LAB — CBC
Hematocrit: 46.7 % (ref 37.5–51.0)
Hemoglobin: 14.8 g/dL (ref 13.0–17.7)
MCH: 27.4 pg (ref 26.6–33.0)
MCHC: 31.7 g/dL (ref 31.5–35.7)
MCV: 86 fL (ref 79–97)
Platelets: 190 10*3/uL (ref 150–450)
RBC: 5.41 x10E6/uL (ref 4.14–5.80)
RDW: 14.1 % (ref 11.6–15.4)
WBC: 7.2 10*3/uL (ref 3.4–10.8)

## 2023-09-18 ENCOUNTER — Other Ambulatory Visit (HOSPITAL_BASED_OUTPATIENT_CLINIC_OR_DEPARTMENT_OTHER): Payer: Self-pay

## 2023-09-18 ENCOUNTER — Other Ambulatory Visit: Payer: Self-pay | Admitting: Nurse Practitioner

## 2023-09-18 ENCOUNTER — Telehealth: Payer: Self-pay | Admitting: *Deleted

## 2023-09-18 DIAGNOSIS — M2550 Pain in unspecified joint: Secondary | ICD-10-CM

## 2023-09-18 MED ORDER — TIZANIDINE HCL 4 MG PO TABS
4.0000 mg | ORAL_TABLET | Freq: Two times a day (BID) | ORAL | 2 refills | Status: DC | PRN
  Filled 2023-09-18: qty 60, 30d supply, fill #0
  Filled 2023-11-10: qty 60, 30d supply, fill #1
  Filled 2024-01-12: qty 60, 30d supply, fill #2

## 2023-09-18 NOTE — Telephone Encounter (Signed)
Reviewed procedure instructions with patient.  

## 2023-09-18 NOTE — Telephone Encounter (Signed)
Last apt 02/14/23.

## 2023-09-18 NOTE — Telephone Encounter (Addendum)
Cardiac Catheterization scheduled at [facility] for: Tuesday September 19, 2023 7:30 AM Arrival time [facility] Main Entrance A at: 5:30 AM  Nothing to eat after midnight prior to procedure, clear liquids until 5 AM day of procedure  Medication instructions: -Hold:  Eliquis-none 09/17/23 until post procedure  Metformin-day of procedure and 48 hours post procedure  Jardiance/Lasix/KCl-AM of procedure  -Other usual morning medications can be taken with sips of water including aspirin 81 mg.  Plan to go home the same day, you will only stay overnight if medically necessary.  You must have responsible adult to drive you home.  Someone must be with you the first 24 hours after you arrive home.  Left message for patient to call back to review procedure instructions.

## 2023-09-19 ENCOUNTER — Encounter (HOSPITAL_COMMUNITY)
Admission: RE | Disposition: A | Discharge status: Home / Self Care | Payer: Self-pay | Source: Home / Self Care | Attending: Cardiovascular Disease

## 2023-09-19 ENCOUNTER — Encounter (HOSPITAL_COMMUNITY): Payer: Self-pay | Admitting: Cardiovascular Disease

## 2023-09-19 ENCOUNTER — Other Ambulatory Visit: Payer: Self-pay

## 2023-09-19 ENCOUNTER — Ambulatory Visit (HOSPITAL_COMMUNITY)
Admission: RE | Admit: 2023-09-19 | Discharge: 2023-09-19 | Disposition: A | Discharge status: Home / Self Care | Payer: Medicare Other | Attending: Cardiovascular Disease | Admitting: Cardiovascular Disease

## 2023-09-19 DIAGNOSIS — E785 Hyperlipidemia, unspecified: Secondary | ICD-10-CM | POA: Diagnosis not present

## 2023-09-19 DIAGNOSIS — I25118 Atherosclerotic heart disease of native coronary artery with other forms of angina pectoris: Secondary | ICD-10-CM | POA: Diagnosis present

## 2023-09-19 DIAGNOSIS — I2584 Coronary atherosclerosis due to calcified coronary lesion: Secondary | ICD-10-CM | POA: Insufficient documentation

## 2023-09-19 DIAGNOSIS — I5032 Chronic diastolic (congestive) heart failure: Secondary | ICD-10-CM | POA: Insufficient documentation

## 2023-09-19 DIAGNOSIS — R0602 Shortness of breath: Secondary | ICD-10-CM

## 2023-09-19 DIAGNOSIS — Z7901 Long term (current) use of anticoagulants: Secondary | ICD-10-CM | POA: Insufficient documentation

## 2023-09-19 DIAGNOSIS — I110 Hypertensive heart disease with heart failure: Secondary | ICD-10-CM | POA: Insufficient documentation

## 2023-09-19 DIAGNOSIS — Z955 Presence of coronary angioplasty implant and graft: Secondary | ICD-10-CM | POA: Diagnosis not present

## 2023-09-19 DIAGNOSIS — Z7984 Long term (current) use of oral hypoglycemic drugs: Secondary | ICD-10-CM | POA: Insufficient documentation

## 2023-09-19 DIAGNOSIS — G4733 Obstructive sleep apnea (adult) (pediatric): Secondary | ICD-10-CM | POA: Insufficient documentation

## 2023-09-19 DIAGNOSIS — Z79899 Other long term (current) drug therapy: Secondary | ICD-10-CM | POA: Insufficient documentation

## 2023-09-19 DIAGNOSIS — I4821 Permanent atrial fibrillation: Secondary | ICD-10-CM | POA: Insufficient documentation

## 2023-09-19 DIAGNOSIS — R079 Chest pain, unspecified: Secondary | ICD-10-CM

## 2023-09-19 HISTORY — PX: RIGHT/LEFT HEART CATH AND CORONARY ANGIOGRAPHY: CATH118266

## 2023-09-19 LAB — POCT I-STAT EG7
Acid-base deficit: 1 mmol/L (ref 0.0–2.0)
Acid-base deficit: 1 mmol/L (ref 0.0–2.0)
Bicarbonate: 25.1 mmol/L (ref 20.0–28.0)
Bicarbonate: 25 mmol/L (ref 20.0–28.0)
Calcium, Ion: 1.14 mmol/L — ABNORMAL LOW (ref 1.15–1.40)
Calcium, Ion: 1.23 mmol/L (ref 1.15–1.40)
HCT: 38 % — ABNORMAL LOW (ref 39.0–52.0)
HCT: 39 % (ref 39.0–52.0)
Hemoglobin: 12.9 g/dL — ABNORMAL LOW (ref 13.0–17.0)
Hemoglobin: 13.3 g/dL (ref 13.0–17.0)
O2 Saturation: 67 %
O2 Saturation: 67 %
Potassium: 4 mmol/L (ref 3.5–5.1)
Potassium: 4.2 mmol/L (ref 3.5–5.1)
Sodium: 141 mmol/L (ref 135–145)
Sodium: 141 mmol/L (ref 135–145)
TCO2: 27 mmol/L (ref 22–32)
TCO2: 26 mmol/L (ref 22–32)
pCO2, Ven: 46.8 mm[Hg] (ref 44–60)
pCO2, Ven: 47.8 mm[Hg] (ref 44–60)
pH, Ven: 7.337 (ref 7.25–7.43)
pH, Ven: 7.327 (ref 7.25–7.43)
pO2, Ven: 37 mm[Hg] (ref 32–45)
pO2, Ven: 38 mm[Hg] (ref 32–45)

## 2023-09-19 LAB — POCT I-STAT 7, (LYTES, BLD GAS, ICA,H+H)
Acid-base deficit: 3 mmol/L — ABNORMAL HIGH (ref 0.0–2.0)
Bicarbonate: 20.7 mmol/L (ref 20.0–28.0)
Calcium, Ion: 1.06 mmol/L — ABNORMAL LOW (ref 1.15–1.40)
HCT: 37 % — ABNORMAL LOW (ref 39.0–52.0)
Hemoglobin: 12.6 g/dL — ABNORMAL LOW (ref 13.0–17.0)
O2 Saturation: 97 %
Potassium: 3.8 mmol/L (ref 3.5–5.1)
Sodium: 142 mmol/L (ref 135–145)
TCO2: 22 mmol/L (ref 22–32)
pCO2 arterial: 33.8 mm[Hg] (ref 32–48)
pH, Arterial: 7.395 (ref 7.35–7.45)
pO2, Arterial: 88 mm[Hg] (ref 83–108)

## 2023-09-19 LAB — GLUCOSE, CAPILLARY
Glucose-Capillary: 182 mg/dL — ABNORMAL HIGH (ref 70–99)
Glucose-Capillary: 161 mg/dL — ABNORMAL HIGH (ref 70–99)

## 2023-09-19 SURGERY — RIGHT/LEFT HEART CATH AND CORONARY ANGIOGRAPHY
Anesthesia: LOCAL

## 2023-09-19 MED ORDER — HEPARIN (PORCINE) IN NACL 1000-0.9 UT/500ML-% IV SOLN
INTRAVENOUS | Status: DC | PRN
  Administered 2023-09-19 (×2): 500 mL

## 2023-09-19 MED ORDER — LIDOCAINE HCL (PF) 1 % IJ SOLN
INTRAMUSCULAR | Status: DC | PRN
  Administered 2023-09-19: 10 mL

## 2023-09-19 MED ORDER — ONDANSETRON HCL 4 MG/2ML IJ SOLN: 4.0000 mg | Freq: Four times a day (QID) | INTRAMUSCULAR | Status: DC | PRN

## 2023-09-19 MED ORDER — SODIUM CHLORIDE 0.9 % IV SOLN: INTRAVENOUS | Status: AC

## 2023-09-19 MED ORDER — HEPARIN SODIUM (PORCINE) 1000 UNIT/ML IJ SOLN
INTRAMUSCULAR | Status: DC | PRN
  Administered 2023-09-19: 5000 [IU] via INTRAVENOUS

## 2023-09-19 MED ORDER — VERAPAMIL HCL 2.5 MG/ML IV SOLN
INTRAVENOUS | Status: AC
Start: 2023-09-19 — End: ?
  Filled 2023-09-19: qty 2

## 2023-09-19 MED ORDER — MIDAZOLAM HCL 2 MG/2ML IJ SOLN
INTRAMUSCULAR | Status: DC | PRN
  Administered 2023-09-19: 2 mg via INTRAVENOUS

## 2023-09-19 MED ORDER — ASPIRIN 81 MG PO CHEW: 81.0000 mg | CHEWABLE_TABLET | ORAL | Status: DC

## 2023-09-19 MED ORDER — HYDRALAZINE HCL 20 MG/ML IJ SOLN: 10.0000 mg | INTRAMUSCULAR | Status: DC | PRN

## 2023-09-19 MED ORDER — HEPARIN SODIUM (PORCINE) 1000 UNIT/ML IJ SOLN
INTRAMUSCULAR | Status: AC
  Filled 2023-09-19: qty 10

## 2023-09-19 MED ORDER — ACETAMINOPHEN 325 MG PO TABS: 650.0000 mg | ORAL_TABLET | ORAL | Status: DC | PRN

## 2023-09-19 MED ORDER — LABETALOL HCL 5 MG/ML IV SOLN: 10.0000 mg | INTRAVENOUS | Status: DC | PRN

## 2023-09-19 MED ORDER — VERAPAMIL HCL 2.5 MG/ML IV SOLN
INTRAVENOUS | Status: DC | PRN
  Administered 2023-09-19: 10 mL via INTRA_ARTERIAL

## 2023-09-19 MED ORDER — LIDOCAINE HCL (PF) 1 % IJ SOLN
INTRAMUSCULAR | Status: AC
Start: 2023-09-19 — End: ?
  Filled 2023-09-19: qty 30

## 2023-09-19 MED ORDER — FENTANYL CITRATE (PF) 100 MCG/2ML IJ SOLN
INTRAMUSCULAR | Status: DC | PRN
  Administered 2023-09-19: 50 ug via INTRAVENOUS

## 2023-09-19 MED ORDER — SODIUM CHLORIDE 0.9 % WEIGHT BASED INFUSION: 3.0000 mL/kg/h | INTRAVENOUS | Status: AC

## 2023-09-19 MED ORDER — SODIUM CHLORIDE 0.9 % WEIGHT BASED INFUSION: 1.0000 mL/kg/h | INTRAVENOUS | Status: DC

## 2023-09-19 MED ORDER — SODIUM CHLORIDE 0.9% FLUSH: 3.0000 mL | Freq: Two times a day (BID) | INTRAVENOUS | Status: DC

## 2023-09-19 MED ORDER — MIDAZOLAM HCL 2 MG/2ML IJ SOLN
INTRAMUSCULAR | Status: AC
  Filled 2023-09-19: qty 2

## 2023-09-19 MED ORDER — SODIUM CHLORIDE 0.9% FLUSH: 3.0000 mL | INTRAVENOUS | Status: DC | PRN

## 2023-09-19 MED ORDER — ASPIRIN 81 MG PO CHEW
81.0000 mg | CHEWABLE_TABLET | ORAL | Status: AC
  Administered 2023-09-19: 81 mg via ORAL
  Filled 2023-09-19: qty 1

## 2023-09-19 MED ORDER — FENTANYL CITRATE (PF) 100 MCG/2ML IJ SOLN
INTRAMUSCULAR | Status: AC
  Filled 2023-09-19: qty 2

## 2023-09-19 MED ORDER — SODIUM CHLORIDE 0.9 % IV SOLN: 250.0000 mL | INTRAVENOUS | Status: DC | PRN

## 2023-09-19 MED ORDER — IOHEXOL 350 MG/ML SOLN
INTRAVENOUS | Status: DC | PRN
  Administered 2023-09-19: 40 mL via INTRA_ARTERIAL

## 2023-09-19 SURGICAL SUPPLY — 11 items
CATH 5FR JL3.5 JR4 ANG PIG MP (CATHETERS) IMPLANT
CATH BALLN WEDGE 5F 110CM (CATHETERS) IMPLANT
DEVICE RAD COMP TR BAND LRG (VASCULAR PRODUCTS) IMPLANT
GLIDESHEATH SLEND SS 6F .021 (SHEATH) IMPLANT
GUIDEWIRE .025 260CM (WIRE) IMPLANT
GUIDEWIRE INQWIRE 1.5J.035X260 (WIRE) IMPLANT
INQWIRE 1.5J .035X260CM (WIRE) ×1
KIT SYRINGE INJ CVI SPIKEX1 (MISCELLANEOUS) IMPLANT
PACK CARDIAC CATHETERIZATION (CUSTOM PROCEDURE TRAY) ×1 IMPLANT
SET ATX-X65L (MISCELLANEOUS) IMPLANT
SHEATH GLIDE SLENDER 4/5FR (SHEATH) IMPLANT

## 2023-09-19 NOTE — Interval H&P Note (Signed)
History and Physical Interval Note:  09/19/2023 8:10 AM  Shaun [name]  has presented today for surgery, with the diagnosis of chest pain - shortness of breath.  The various methods of treatment have been discussed with the patient and family. After consideration of risks, benefits and other options for treatment, the patient has consented to  Procedure(s): RIGHT/LEFT HEART CATH AND CORONARY ANGIOGRAPHY (N/A) as a surgical intervention.  The patient's history has been reviewed, patient examined, no change in status, stable for surgery.  I have reviewed the patient's chart and labs.  Questions were answered to the patient's satisfaction.    Cath Lab Visit (complete for each Cath Lab visit)  Clinical Evaluation Leading to the Procedure:   ACS: No.  Non-ACS:    Anginal Classification: CCS III  Anti-ischemic medical therapy: Minimal Therapy (1 class of medications)  Non-Invasive Test Results: Low-risk stress test findings: cardiac mortality <1%/year  Prior CABG: No previous CABG        Verne Carrow

## 2023-09-19 NOTE — Discharge Instructions (Addendum)
Resume Eliquis tomorrow if no bleeding from right arm cath sites Drink plenty of fluids for 48 hours and keep wrist elevated at heart level for 24 hours  Radial Site Care   This sheet gives you information about how to care for yourself after your procedure. Your health care provider may also give you more specific instructions. If you have problems or questions, contact your health care provider. What can I expect after the procedure? After the procedure, it is common to have: Bruising and tenderness at the catheter insertion area. Follow these instructions at home: Medicines Take over-the-counter and prescription medicines only as told by your health care provider. Insertion site care Follow instructions from your health care provider about how to take care of your insertion site. Make sure you: Wash your hands with soap and water before you change your bandage (dressing). If soap and water are not available, use hand sanitizer. Remove your dressing tomorrow at 12 noon Check your insertion site every day for signs of infection. Check for: Redness, swelling, or pain. Fluid or blood. Pus or a bad smell. Warmth. Do not take baths, swim, or use a hot tub for 7 days You may shower after removal of dressing Remove the dressing allow water run over site Pat the area dry with a clean towel. Do not rub the site. That could cause bleeding. Do not apply powder or lotion to the site. Activity   For 24 hours after the procedure, or as directed by your health care provider: Do not flex the right wrist. Do not push or pull heavy objects with the affected arm. Do not drive yourself home from the hospital or clinic. You may drive 24 hours after the procedure unless your health care provider tells you not to. Do not operate machinery or power tools. Do not lift anything that is heavier than 5 lbs.  For 4 days Ask your health care provider when it is okay to: Return to work or school. Resume  usual physical activities or sports. Resume sexual activity. General instructions If the catheter site starts to bleed, raise your arm and put firm pressure on the site for 15 minutes. If the bleeding does not stop, get help right away. This is a medical emergency. If you went home on the same day as your procedure, a responsible adult should be with you for the first 24 hours after you arrive home. Keep all follow-up visits as told by your health care provider. This is important. Contact a health care provider if: You have a fever. You have redness, swelling, or yellow drainage around your insertion site. Get help right away if: You have unusual pain at the radial site.  The catheter insertion area swells very fast. Raise your arm and put firm pressure on the swelling for 15 minutes. If the swelling does not stop, get help right away. The insertion area is bleeding, and the bleeding does not stop when you hold steady pressure on the area for 15 minutes. Your arm or hand becomes pale, cool, tingly, or numb. These symptoms may represent a serious problem that is an emergency. Do not wait to see if the symptoms will go away. Get medical help right away. Call your local emergency services (911 in the [place] Do not drive yourself to the hospital. Summary After the procedure, it is common to have bruising and tenderness at the site. Follow instructions from your health care provider about how to take care of your radial site wound.  Check the wound every day for signs of infection. Do not lift anything that is heavier than 10 lb (4.5 kg), or the limit that you are told, until your health care provider says that it is safe. This information is not intended to replace advice given to you by your health care provider. Make sure you discuss any questions you have with your health care provider. Document Revised: 09/20/2017 Document Reviewed: 09/20/2017 Elsevier Patient Education  2020 [employer].

## 2023-09-20 ENCOUNTER — Telehealth: Payer: Self-pay

## 2023-09-20 NOTE — Telephone Encounter (Addendum)
Results viewed by patient via MyChart.----- Message from Azalee Course sent at 09/18/2023  1:53 PM EST ----- Ordered by Wyn Forster. Stable renal function and electrolyte. Normal red blood cell count.

## 2023-09-23 ENCOUNTER — Other Ambulatory Visit (HOSPITAL_BASED_OUTPATIENT_CLINIC_OR_DEPARTMENT_OTHER): Payer: Self-pay

## 2023-09-25 ENCOUNTER — Encounter: Payer: Self-pay | Admitting: Internal Medicine

## 2023-09-25 ENCOUNTER — Ambulatory Visit: Payer: Medicare Other | Admitting: Internal Medicine

## 2023-09-25 VITALS — BP 124/80 | HR 94 | Ht 68.0 in | Wt 217.0 lb

## 2023-09-25 DIAGNOSIS — E0590 Thyrotoxicosis, unspecified without thyrotoxic crisis or storm: Secondary | ICD-10-CM

## 2023-09-25 NOTE — Progress Notes (Unsigned)
Name: Shaun [name]  MRN/ DOB: [id number], 11-18-1955    Age/ Sex: 68 y.o., male    PCP: Early, Sung Amabile, NP   Reason for Endocrinology Evaluation: Subclinical hyperthyroidism     Date of Initial Endocrinology Evaluation: 01/16/2023    HPI: Shaun [name] is a 68 y.o. male with a past medical history of atrial fibrillation, CAD, HTN, CHF, dyslipidemia, DM. The patient presented for initial endocrinology clinic visit on 01/16/2023 for consultative assistance with his subclinical hyperthyroidism.   Patient has been noted with low TSH 03/2022 at <0.01 u IU/mL, with repeat TSH of 0.033u IU/mL 05/2022.  Of note the patient has had normal free T4 and T3   Patient follows with cardiology for A-fib, CAD, and CHF   He is not on amiodarone, except for amiodarone infusion 07/2021    No Fh of thyroid disease  TRAb negative  12/2022  Patient was started on methimazole 12/2022 with a TSH of 0.350 u IU/mL, normal free T4 and T3  SUBJECTIVE:    Today (09/25/23):  Shaun [name] is here for follow-up on hyperthyroidism. He is accompanied by his spouse today  Patient continues to follow-up with cardiology for CAD , CHF, and A-fib He is s/p cardiac cath 09/19/2023  Patient has been noted with weight loss Denies local neck swelling  Denies  palpitations  Continues with chronic tremors  Denies diarrhea or loose stools  Continues with fatigue   Methimazole 5 mg, 4 days a week ( Monday, Wednesday , Friday and Saturday )    HISTORY:  Past Medical History:  Past Medical History:  Diagnosis Date   A-fib (HCC)    Anemia    Anginal pain (HCC)    Anxiety    Asthma    Bacterial URI 08/29/2022   CAD (coronary artery disease)    DES to distal circumflex 2016   Cataract    CHF (congestive heart failure) (HCC)    Colon polyps    30 colon polyps found on first colonoscopy   Depression    Diastolic heart failure (HCC)    Diverticulitis    DJD (degenerative joint disease)     Dyspnea    Dysrhythmia    A-Fib   Genetic testing 09/09/2014   Negative genetic testing on the ColoNext panel test and the MSH2 inversion testing.  The ColoNext gene panel offered by [facility] and includes sequencing and rearrangement analysis for the following 17 genes: APC, BMPR1A, CDH1, CHEK2, EPCAM, GREM1, MLH1, MSH2, MSH6, MUTYH, PMS2, POLD1, POLE, PTEN, SMAD4, STK11, and TP53.   The report date is 09/08/14.      GERD (gastroesophageal reflux disease)    History of kidney stones    Hyperlipidemia    Hypernatremia 08/22/2021   Hypertension    Insomnia    Obstructive sleep apnea 12/2009   01/26/2010 AHI 83/hr   Permanent atrial fibrillation (HCC)    Onset 2006 paroxysmal then progressive to persistent   Pneumonia    PUD (peptic ulcer disease)    1980s   RLS (restless legs syndrome)    Sinusitis    Skin cancer    Type 2 diabetes mellitus (HCC)    Past Surgical History:  Past Surgical History:  Procedure Laterality Date   BIOPSY  07/17/2018   Procedure: BIOPSY;  Surgeon: West Bali, MD;  [facility];  Service: Endoscopy;;  colon   BOWEL RESECTION  09/17/2018   SMALL BOWEL RESECTION: 71 CM  CARDIAC CATHETERIZATION N/A 07/21/2015   Procedure: Left Heart Cath and Coronary Angiography;  Surgeon: Peter M Swaziland, MD;  Location: [facility] INVASIVE CV LAB;  Service: Cardiovascular;  Laterality: N/A;   CARDIAC CATHETERIZATION N/A 07/21/2015   Procedure: Coronary Stent Intervention;  Surgeon: Peter M Swaziland, MD;  Location: [facility] INVASIVE CV LAB;  Service: Cardiovascular;  Laterality: N/A;   CIRCUMCISION N/A 04/05/2019   Procedure: CIRCUMCISION ADULT;  Surgeon: Bjorn Pippin, MD;  Location: [facility];  Service: Urology;  Laterality: N/A;   COLONOSCOPY N/A 05/19/2014   Dr. Cyndi Bender diverticulosis/moderate external hemorrhoids, >20 simple adenomas. Genetic screening negative.    COLONOSCOPY WITH PROPOFOL N/A 07/17/2018   Dr. Darrick Penna: Diverticulosis, external/internal hemorrhoids,  32 colon polyps removed.  ten tubular adenomas removed with no high-grade dysplasia.  Advised to have surveillance colonoscopy in 3 years.   COLONOSCOPY WITH PROPOFOL N/A 06/07/2021   Procedure: COLONOSCOPY WITH PROPOFOL;  Surgeon: Lanelle Bal, DO;  [facility]: [facility];  Service: Endoscopy;  Laterality: N/A;  9:30 / ASA 3  (Pt was told that his time will be given at Pre-op)   CORONARY PRESSURE/FFR STUDY Left 06/08/2017   Procedure: INTRAVASCULAR PRESSURE WIRE/FFR STUDY;  Surgeon: Yvonne Kendall, MD;  Location: [facility] INVASIVE CV LAB;  Service: Cardiovascular;  Laterality: Left;  LAD and CFX   ESOPHAGOGASTRODUODENOSCOPY (EGD) WITH PROPOFOL N/A 07/17/2018   Dr. Darrick Penna: Low-grade narrowing Schatzki ring at the GE junction status post dilation.  Gastritis.  Biopsy with mild nonspecific reactive gastropathy.  No H. pylori.   GIVENS CAPSULE STUDY N/A 06/24/2019   normal   HERNIA REPAIR  1986   Left inguinal   INCISIONAL HERNIA REPAIR N/A 10/19/2022   Procedure: OPEN INCISIONAL HERNIA REPAIR;  Surgeon: Fritzi Mandes, MD;  Location: [facility] OR;  Service: General;  Laterality: N/A;   INSERTION OF MESH N/A 10/19/2022   Procedure: INSERTION OF MESH;  Surgeon: Fritzi Mandes, MD;  Location: [facility] OR;  Service: General;  Laterality: N/A;   LAPAROTOMY N/A 09/17/2018   Procedure: EXPLORATORY LAPAROTOMY;  Surgeon: Lucretia Roers, MD;  Location: [facility];  Service: General;  Laterality: N/A;   LEFT HEART CATH AND CORONARY ANGIOGRAPHY N/A 06/08/2017   Procedure: LEFT HEART CATH AND CORONARY ANGIOGRAPHY;  Surgeon: Yvonne Kendall, MD;  Location: [facility] INVASIVE CV LAB;  Service: Cardiovascular;  Laterality: N/A;   POLYPECTOMY  07/17/2018   Procedure: POLYPECTOMY;  Surgeon: West Bali, MD;  [facility];  Service: Endoscopy;;  colon   POLYPECTOMY  06/07/2021   Procedure: POLYPECTOMY INTESTINAL;  Surgeon: Lanelle Bal, DO;  [facility];  Service: Endoscopy;;   RIGHT/LEFT HEART CATH  AND CORONARY ANGIOGRAPHY N/A 09/19/2023   Procedure: RIGHT/LEFT HEART CATH AND CORONARY ANGIOGRAPHY;  Surgeon: Kathleene Hazel, MD;  Location: [facility] INVASIVE CV LAB;  Service: Cardiovascular;  Laterality: N/A;   ROTATOR CUFF REPAIR     Right   SAVORY DILATION N/A 07/17/2018   Procedure: SAVORY DILATION;  Surgeon: West Bali, MD;  Location: [facility];  Service: Endoscopy;  Laterality: N/A;    Social History:  reports that he has been smoking cigarettes. He started smoking about 53 years ago. He has a 25.5 pack-year smoking history. He has been exposed to tobacco smoke. He has never used smokeless tobacco. He reports current drug use. Drug: Hydrocodone. He reports that he does not drink alcohol. Family History: family history includes Brain cancer in his maternal uncle; Breast cancer in his cousin; Breast cancer (age  of onset: 52) in his mother; Cancer in his cousin and maternal uncle; Diabetes in his brother; Heart attack in his father; Hypertension in his mother; Parkinson's disease in his brother; Skin cancer (age of onset: 23) in his sister.   HOME MEDICATIONS: Allergies as of 09/25/2023   No Known Allergies      Medication List        Accurate as of September 25, 2023  8:41 AM. If you have any questions, ask your nurse or doctor.          albuterol 108 (90 Base) MCG/ACT inhaler Commonly known as: Ventolin HFA INHALE 2 PUFFS INTO THE LUNGS EVERY 6 HOURS AS NEEDED FOR WHEEZING OR SHORTNESS OF BREATH   albuterol (2.5 MG/3ML) 0.083% nebulizer solution Commonly known as: PROVENTIL Take 3 mLs (2.5 mg total) by nebulization every 6 (six) hours as needed for wheezing or shortness of breath.   ascorbic acid 500 MG tablet Commonly known as: VITAMIN C Take 500 mg by mouth daily.   atorvastatin 80 MG tablet Commonly known as: LIPITOR Take 1 tablet (80 mg total) by mouth once daily in the evening.  Please follow up in 2-3 months. (Take 1 tablet (80 mg total) by mouth every  evening.)   buPROPion 150 MG 24 hr tablet Commonly known as: Wellbutrin XL Take 3 tablets by mouth once daily. (Take 3 tablets once daily) What changed:  how much to take how to take this when to take this additional instructions   docusate sodium 100 MG capsule Commonly known as: COLACE Take 1 capsule (100 mg total) by mouth daily as needed for mild constipation. What changed: when to take this   Eliquis 5 MG Tabs tablet Generic drug: apixaban Take 1 tablet (5 mg total) by mouth 2 (two) times daily.   escitalopram 20 MG tablet Commonly known as: LEXAPRO Take 20 mg by mouth in the morning.   ezetimibe 10 MG tablet Commonly known as: ZETIA Take 1 tablet (10 mg total) by mouth daily.   FeroSul 325 (65 Fe) MG tablet Generic drug: ferrous sulfate Take 1 tablet (325 mg total) by mouth 3 (three) times a week.   furosemide 40 MG tablet Commonly known as: LASIX Take 1 tablet (40mg ) daily. May take 1/2 tablet (20mg ) additional if shortness of breath or swelling occurs. What changed:  how much to take how to take this when to take this additional instructions   HYDROcodone-acetaminophen 5-325 MG tablet Commonly known as: NORCO/VICODIN Take 1-2 tablets by mouth 2 (two) times daily as needed for moderate pain (pain score 4-6) or severe pain (pain score 7-10). What changed:  how much to take when to take this   hydrOXYzine 25 MG tablet Commonly known as: ATARAX Take 1 tablet (25 mg total) by mouth at bedtime and may repeat dose one time if needed. For sleep.   Jardiance 10 MG Tabs tablet Generic drug: empagliflozin Take 1 tablet (10 mg total) by mouth daily before breakfast.   metFORMIN 500 MG tablet Commonly known as: GLUCOPHAGE Take 1 tablet (500mg ) by mouth twice daily with a meal. (Take 1 tablet (500 mg total) by mouth 2 (two) times daily before a meal.)   methimazole 5 MG tablet Commonly known as: TAPAZOLE Take 1 tablet (5 mg total) by mouth 4 days a week.    metoprolol tartrate 100 MG tablet Commonly known as: LOPRESSOR Take 1 tablet (100 mg total) by mouth 2 (two) times daily.   nitroGLYCERIN 0.4 MG SL tablet  Commonly known as: NITROSTAT Place 1 tablet (0.4 mg total) under the tongue every 5 (five) minutes as needed.   pantoprazole 40 MG tablet Commonly known as: PROTONIX Take 1 tablet (40 mg total) by mouth daily before breakfast.   PEG 3350 17 GM/SCOOP Powd Take one capful (17g) twice daily until soft stool, then continue one capful once daily as needed.   potassium chloride SA 20 MEQ tablet Commonly known as: KLOR-CON M Take 1 tablet (20 mEq total) by mouth daily.   rOPINIRole 3 MG tablet Commonly known as: REQUIP Take 1 tablet by mouth nightly for restless leg, and a 1/2 tab during the day to control breakthrough symptoms   sildenafil 100 MG tablet Commonly known as: VIAGRA Take 1 tablet (100 mg total) by mouth 30 minutes before activity   tiZANidine 4 MG tablet Commonly known as: Zanaflex Take 1 tablet (4 mg total) by mouth 2 (two) times daily as needed for muscle spasms.   traZODone 150 MG tablet Commonly known as: DESYREL Take 1 tablet (150 mg total) by mouth at bedtime.          REVIEW OF SYSTEMS: A comprehensive ROS was conducted with the patient and is negative except as per HPI    OBJECTIVE:  VS: BP 124/80 (BP Location: Left Arm, Patient Position: Sitting, Cuff Size: Normal)   Pulse 94   Ht 5\' 8"  (1.727 m)   Wt 217 lb (98.4 kg)   SpO2 99%   BMI 32.99 kg/m    Wt Readings from Last 3 Encounters:  09/25/23 217 lb (98.4 kg)  09/19/23 223 lb (101.2 kg)  09/15/23 223 lb 3.2 oz (101.2 kg)     EXAM: General: Pt appears well and is in NAD  Neck: General: Supple without adenopathy. Thyroid: Thyroid size normal.  No goiter or nodules appreciated.   Lungs: Clear with good BS bilat   Heart: Auscultation: RRR.  Abdomen: Soft, nontender  Extremities:  BL LE: No pretibial edema   Mental Status: Judgment,  insight: Intact Orientation: Oriented to time, place, and person Mood and affect: No depression, anxiety, or agitation     DATA REVIEWED:    Latest Reference Range & Units 03/31/23 11:32  TSH 0.35 - 5.50 uIU/mL 0.84  T4,Free(Direct) 0.60 - 1.60 ng/dL 1.61  Thyrotropin Receptor Ab 0.00 - 1.75 IU/L <1.10      Latest Reference Range & Units 09/15/23 09:00  WBC 3.4 - 10.8 x10E3/uL 7.2  RBC 4.14 - 5.80 x10E6/uL 5.41  Hemoglobin 13.0 - 17.7 g/dL 09.6  HCT 04.5 - 40.9 % 46.7  MCV 79 - 97 fL 86  MCH 26.6 - 33.0 pg 27.4  MCHC 31.5 - 35.7 g/dL 81.1  RDW 91.4 - 78.2 % 14.1  Platelets 150 - 450 x10E3/uL 190      Latest Reference Range & Units 09/15/23 09:00  Sodium 134 - 144 mmol/L 140  Potassium 3.5 - 5.2 mmol/L 4.3  Chloride 96 - 106 mmol/L 101  CO2 20 - 29 mmol/L 19 (L)  Glucose 70 - 99 mg/dL 956 (H)  BUN 8 - 27 mg/dL 17  Creatinine 2.13 - 0.86 mg/dL 5.78  Calcium 8.6 - 46.9 mg/dL 9.3  BUN/Creatinine Ratio 10 - 24  18  eGFR >59 mL/min/1.73 88     01/16/2023 Thyrotropin Receptor Ab <1.10    Old records , labs and images have been reviewed.     ASSESSMENT/PLAN/RECOMMENDATIONS:   Subclinical Hyperthyroidism:  -Discussed differential diagnosis to include Graves' disease, autonomous thyroid  nodule - No local neck symptoms  -We discussed the importance of euthyroidism in the setting of cardiac arrhythmia   Medication  Methimazole 5 mg, 1 tab Monday, Wednesday , Friday and Saturday   F/U in 6 months   Signed electronically by: Lyndle Herrlich, MD  [facility]  [facility] [address] [place] [place], [place] [address] Phone: [phone] FAX: [phone]   CC: [name], NP [address] [place] [address] Phone: [phone] Fax: [phone]   Return to Endocrinology clinic as below: Future Appointments  Date Time Provider Department Center  09/29/2023 10:05 AM Azalee Course, PA [facility] None   11/07/2023  9:45 AM Kirsteins, Victorino Sparrow, MD [facility] CPR  05/14/2024  9:15 AM [facility]-ANNUAL WELLNESS VISIT [facility]

## 2023-09-26 ENCOUNTER — Other Ambulatory Visit (HOSPITAL_BASED_OUTPATIENT_CLINIC_OR_DEPARTMENT_OTHER): Payer: Self-pay

## 2023-09-26 ENCOUNTER — Encounter: Payer: Self-pay | Admitting: Internal Medicine

## 2023-09-26 LAB — TSH: TSH: 1.35 m[IU]/L (ref 0.40–4.50)

## 2023-09-26 LAB — T4, FREE: Free T4: 1.2 ng/dL (ref 0.8–1.8)

## 2023-09-26 MED ORDER — METHIMAZOLE 5 MG PO TABS
5.0000 mg | ORAL_TABLET | ORAL | 3 refills | Status: DC
  Filled 2023-09-26: qty 52, 90d supply, fill #0
  Filled 2023-11-29: qty 52, 90d supply, fill #1

## 2023-09-27 ENCOUNTER — Other Ambulatory Visit (HOSPITAL_BASED_OUTPATIENT_CLINIC_OR_DEPARTMENT_OTHER): Payer: Self-pay

## 2023-09-28 ENCOUNTER — Other Ambulatory Visit (HOSPITAL_BASED_OUTPATIENT_CLINIC_OR_DEPARTMENT_OTHER): Payer: Self-pay

## 2023-09-29 ENCOUNTER — Encounter: Payer: Self-pay | Admitting: Physician Assistant

## 2023-09-29 ENCOUNTER — Ambulatory Visit: Payer: Medicare Other | Attending: Physician Assistant | Admitting: Physician Assistant

## 2023-09-29 VITALS — BP 112/72 | HR 82 | Ht 68.0 in | Wt 219.4 lb

## 2023-09-29 DIAGNOSIS — I4821 Permanent atrial fibrillation: Secondary | ICD-10-CM | POA: Diagnosis not present

## 2023-09-29 DIAGNOSIS — E785 Hyperlipidemia, unspecified: Secondary | ICD-10-CM | POA: Diagnosis not present

## 2023-09-29 DIAGNOSIS — E119 Type 2 diabetes mellitus without complications: Secondary | ICD-10-CM

## 2023-09-29 DIAGNOSIS — I10 Essential (primary) hypertension: Secondary | ICD-10-CM | POA: Diagnosis not present

## 2023-09-29 DIAGNOSIS — I2510 Atherosclerotic heart disease of native coronary artery without angina pectoris: Secondary | ICD-10-CM | POA: Diagnosis not present

## 2023-09-29 DIAGNOSIS — Z01810 Encounter for preprocedural cardiovascular examination: Secondary | ICD-10-CM

## 2023-09-29 NOTE — Progress Notes (Signed)
Cardiology Office Note:  .   Date:  09/29/2023  ID:  Shaun [name], DOB Dec 28, 1955, MRN [id number] PCP: [name], NP  [facility] Providers Cardiologist:  [name], MD     History of Present Illness: Shaun [name] is a 68 y.o. male with past medical history of permanent atrial fibrillation, CAD, chronic diastolic heart failure, hypertension, hyperlipidemia and DM2.  He underwent PCI of the left circumflex artery in November 2016.  He was seen at the A-fib clinic in June 2018 to see if there were other options to restore sinus rhythm.  Given longstanding atrial fibrillation and left atrial enlargement, rate control therapy was recommended.  Cardiac catheterization in October 2018 showed 30% distal left circumflex lesion.  LV function was normal with normal filling pressure.  Heart monitor in August 2020 showed atrial fibrillation with PVCs and aberrant conducted beats, rate controlled.  Abdominal CT in July 2022 showed no aneurysm.  Echocardiogram in December 2022 showed normal EF, moderate LVH, mild left atrial enlargement.  He was last seen by Dr. [name] in July 2024 at which time he complained of dyspnea with more strenuous activity but not with active routine activity.  He had URI and COPD exacerbation in October 2024.  This was treated with azithromycin, DuoNeb and Solu-Medrol.  He had chest tightness that reminded him of his symptom prior to stenting in the past.  He was seen in the clinic in November and had 1 episode of exertional chest pain while moving lumbars.  Echocardiogram was ordered on 08/14/2023 which showed EF 65 to 70%, no regional wall motion abnormality, moderate LVH, trivial MR.  PET stress test ordered and completed on 07/10/2023 which showed normal perfusion, normal LV EF, no evidence of ischemia or infarction, abnormal MBF with finding either representing microvascular disease but cannot exclude three-vessel CAD.  Based on the result of the  abnormal stress test, patient ultimately underwent left and right heart cath on 09/19/2023 which showed 40% proximal left circumflex lesion, 30% distal left circumflex lesion, 20% proximal LAD lesion, 30% proximal RCA lesion, 30% mid RCA lesion, 40% OM1 lesion, 40% mid LAD lesion, patent mid to distal left circumflex stent.  Overall nonobstructive disease.  Patient presents today for follow-up.  He has been doing well without significant chest comfort or shortness of breath.  He is accompanied by his wife.  I have reviewed the recent cardiac catheterization report with the patient and the compared to the previous 2018 report, there has been no significant change over the past 7 years.  Overall, he has been doing well.  His wife says he may ended up needing hip surgery, he would be low risk patient proceeding with moderate risk surgery if that is the case.  He is at acceptable risk to proceed from the cardiac perspective.  Once he has decided on the date of the surgery, he will need to send [place] a cardiac clearance letter our medical pharmacist can review how many days prior to the surgery he need to hold his Eliquis.  He has no lower extremity edema, orthopnea or PND.  He can follow-up with Dr. [name] in 6 months.  ROS:   He denies chest pain, palpitations, dyspnea, pnd, orthopnea, n, v, dizziness, syncope, edema, weight gain, or early satiety. All other systems reviewed and are otherwise negative except as noted above.    Studies Reviewed: .        Cardiac Studies & Procedures   CARDIAC  CATHETERIZATION  CARDIAC CATHETERIZATION 09/19/2023  Narrative   Prox LAD lesion is 20% stenosed.   Prox Cx lesion is 40% stenosed.   Dist Cx lesion is 30% stenosed.   Prox RCA lesion is 30% stenosed.   Mid RCA lesion is 30% stenosed.   Ost 1st Diag to 1st Diag lesion is 30% stenosed.   Ost RCA to Prox RCA lesion is 30% stenosed.   1st Mrg lesion is 40% stenosed.   Mid LAD lesion is 40% stenosed.    Previously placed Mid Cx to Dist Cx stent of unknown type is  widely patent.  Mild to moderate non-obstructive disease in the mid LAD Mild to moderate non-obstructive disease in the proximal Circumflex and obtuse marginal branch Patent mid Circumflex stent without restenosis Large dominant RCA with mild proximal and mid stenosis. Normal right and left heart pressures  Recommendations: Continue medical management of CAD  Findings Coronary Findings Diagnostic  Dominance: Right  Left Anterior Descending Prox LAD lesion is 20% stenosed. The lesion is segmental. The lesion is calcified. Mid LAD lesion is 40% stenosed.  First Diagonal Branch Ost 1st Diag to 1st Diag lesion is 30% stenosed. The lesion is calcified.  Left Circumflex Vessel is angiographically normal. Prox Cx lesion is 40% stenosed. Pd/Pa 1.0 Previously placed Mid Cx to Dist Cx stent of unknown type is  widely patent. Dist Cx lesion is 30% stenosed.  First Obtuse Marginal Branch Vessel is large in size. 1st Mrg lesion is 40% stenosed.  Right Coronary Artery Ost RCA to Prox RCA lesion is 30% stenosed. Prox RCA lesion is 30% stenosed. Mid RCA lesion is 30% stenosed.  Intervention  No interventions have been documented.   STRESS TESTS  NM PET CT CARDIAC PERFUSION MULTI W/ABSOLUTE BLOODFLOW 09/05/2023  Narrative   Normal perfusion. Normal LVEF response to stress, no TID. MBF is abnormal. Findings either represent microvascular disease, but cannot exclude 3-vessel CAD. Given the patient history of diffuse mild to moderate non-obstructive CAD in 2018, would consider cardiac catheterization for clarification.   LV perfusion is normal. There is no evidence of ischemia. There is no evidence of infarction.   Rest left ventricular function is abnormal. Rest global function is mildly reduced. There were no regional wall motion abnormalities. Rest EF: 47%. Stress left ventricular function is normal. Stress EF: 51%. End  diastolic cavity size is normal.   Myocardial blood flow was computed to be 0.74ml/g/min at rest and 1.65ml/g/min at stress. Global myocardial blood flow reserve was 1.59 and was abnormal.   Coronary calcium assessment not performed due to prior revascularization.   The study is normal. The study is low risk based on perfusion.  CLINICAL DATA:  This over-read does not include interpretation of cardiac or coronary anatomy or pathology. The Cardiac PET CT interpretation by the cardiologist is attached.  COMPARISON:  07/18/2023  FINDINGS: Cardiovascular: No significant vascular findings. Cardiomegaly. Three-vessel coronary artery calcifications. No pericardial effusion.  Mediastinum/Nodes: No enlarged mediastinal, hilar, or axillary lymph nodes. Thyroid gland, trachea, and esophagus demonstrate no significant findings.  Lungs/Pleura: Emphysema.  No pleural effusion or pneumothorax.  Upper Abdomen: No acute abnormality.  Hepatic steatosis.  Musculoskeletal: No chest wall abnormality. No acute osseous findings.  IMPRESSION: 1. Emphysema. 2. Cardiomegaly and coronary artery disease. 3. Hepatic steatosis.  Emphysema (ICD10-J43.9).   Electronically Signed By: Jearld Lesch M.D. On: 09/05/2023 10:23  ECHOCARDIOGRAM  ECHOCARDIOGRAM COMPLETE 08/14/2023  Narrative ECHOCARDIOGRAM REPORT    Patient Name:   Herschell Virani Hams Date  of Exam: 08/14/2023 Medical Rec #:  [id number]          Height:       68.0 in Accession #:    [id number]         Weight:       220.0 lb Date of Birth:  1955-12-13         BSA:          2.128 m Patient Age:    37 years           BP:           129/79 mmHg Patient Gender: M                  HR:           92 bpm. Exam Location:  [address]  Procedure: 2D Echo, Cardiac Doppler and Color Doppler  Indications:    I50.32 Chronic diastolic (congestive) heart failure  History:        Patient has prior history of Echocardiogram examinations, most recent  08/10/2021. CAD, COPD, Arrythmias:Atrial Fibrillation, Signs/Symptoms:Chest Pain; Risk Factors:Sleep Apnea, Hypertension, Diabetes and Dyslipidemia.  Sonographer:    Cathie Beams RCS Referring Phys: Sharlet Salina FOUNTAIN  IMPRESSIONS   1. Left ventricular ejection fraction, by estimation, is 65 to 70%. The left ventricle has normal function. The left ventricle has no regional wall motion abnormalities. There is moderate left ventricular hypertrophy. Left ventricular diastolic parameters are indeterminate. 2. Right ventricular systolic function is normal. The right ventricular size is normal. Tricuspid regurgitation signal is inadequate for assessing PA pressure. 3. The mitral valve is normal in structure. Trivial mitral valve regurgitation. No evidence of mitral stenosis. 4. The aortic valve is tricuspid. Aortic valve regurgitation is not visualized. No aortic stenosis is present.  FINDINGS Left Ventricle: Left ventricular ejection fraction, by estimation, is 65 to 70%. The left ventricle has normal function. The left ventricle has no regional wall motion abnormalities. The left ventricular internal cavity size was normal in size. There is moderate left ventricular hypertrophy. Left ventricular diastolic parameters are indeterminate.  Right Ventricle: The right ventricular size is normal. No increase in right ventricular wall thickness. Right ventricular systolic function is normal. Tricuspid regurgitation signal is inadequate for assessing PA pressure.  Left Atrium: Left atrial size was normal in size.  Right Atrium: Right atrial size was normal in size.  Pericardium: There is no evidence of pericardial effusion.  Mitral Valve: The mitral valve is normal in structure. Trivial mitral valve regurgitation. No evidence of mitral valve stenosis.  Tricuspid Valve: The tricuspid valve is normal in structure. Tricuspid valve regurgitation is trivial.  Aortic Valve: The aortic valve is  tricuspid. Aortic valve regurgitation is not visualized. No aortic stenosis is present.  Pulmonic Valve: The pulmonic valve was not well visualized. Pulmonic valve regurgitation is trivial.  Aorta: The aortic root and ascending aorta are structurally normal, with no evidence of dilitation.  IAS/Shunts: The interatrial septum was not well visualized.   LEFT VENTRICLE PLAX 2D LVIDd:         4.80 cm LVIDs:         2.80 cm LV PW:         1.40 cm LV IVS:        1.30 cm LVOT diam:     2.40 cm LV SV:         63 LV SV Index:   30 LVOT Area:     4.52 cm   RIGHT VENTRICLE  RV S prime:     16.50 cm/s TAPSE (M-mode): 1.4 cm  LEFT ATRIUM             Index        RIGHT ATRIUM           Index LA diam:        5.50 cm 2.58 cm/m   RA Area:     11.30 cm LA Vol (A2C):   76.3 ml 35.85 ml/m  RA Volume:   21.90 ml  10.29 ml/m LA Vol (A4C):   41.1 ml 19.31 ml/m LA Biplane Vol: 60.1 ml 28.24 ml/m AORTIC VALVE LVOT Vmax:   80.87 cm/s LVOT Vmean:  50.133 cm/s LVOT VTI:    0.139 m  AORTA Ao Root diam: 3.60 cm Ao Asc diam:  3.60 cm   SHUNTS Systemic VTI:  0.14 m Systemic Diam: 2.40 cm  Epifanio Lesches MD Electronically signed by Epifanio Lesches MD Signature Date/Time: 08/14/2023/8:48:45 PM    Final   MONITORS  LONG TERM MONITOR (3-14 DAYS) 04/11/2019  Narrative Atrial fibrillation with PVCs or aberrantly conducted beats.  Rate reasonably well controlled. [name]           Risk Assessment/Calculations:    CHA2DS2-VASc Score = 4   This indicates a 4.8% annual risk of stroke. The patient's score is based upon: CHF History: 0 HTN History: 1 Diabetes History: 1 Stroke History: 0 Vascular Disease History: 1 Age Score: 1 Gender Score: 0            Physical Exam:   VS:  BP 112/72 (BP Location: Left Arm, Patient Position: Sitting, Cuff Size: Large)   Pulse 82   Ht 5\' 8"  (1.727 m)   Wt 219 lb 6.4 oz (99.5 kg)   SpO2 94%   BMI 33.36 kg/m    Wt  Readings from Last 3 Encounters:  09/29/23 219 lb 6.4 oz (99.5 kg)  09/25/23 217 lb (98.4 kg)  09/19/23 223 lb (101.2 kg)    GEN: Well nourished, well developed in no acute distress NECK: No JVD; No carotid bruits CARDIAC: Irregularly irregular, no murmurs, rubs, gallops RESPIRATORY:  Clear to auscultation without rales, wheezing or rhonchi  ABDOMEN: Soft, non-tender, non-distended EXTREMITIES:  No edema; No deformity   ASSESSMENT AND PLAN: .    Coronary Artery Disease (CAD) Stable with no significant change in coronary lesions since 2018. No new chest pain or shortness of breath. Recent cardiac catheterization showed non-obstructive disease. -Continue current cardiac medication regimen. -Encourage increased physical activity to improve HDL levels.  Permanent Atrial Fibrillation Stable on Eliquis. -Continue Eliquis. -Plan for clinical pharmacist to review Eliquis dosing prior to potential hip surgery.  Hypertension: Blood pressure well-controlled  Hyperlipidemia LDL at borderline goal, low HDL. -Encourage increased physical activity to improve HDL levels.  DM 2: Managed by primary care provider  Preoperative Clearance for Hip Surgery Low risk patient for moderate risk surgery. -Cleared for surgery from a cardiac perspective. -Once surgery date is decided, surgeon's office to send cardiac clearance letter for review of Eliquis dosing.        Dispo: Follow-up with Dr. [name] in 6 months  Signed, Azalee Course, [place]

## 2023-09-29 NOTE — Patient Instructions (Signed)
 Medication Instructions:  NO CHANGES *If you need a refill on your cardiac medications before your next appointment, please call your pharmacy*   Lab Work: NO LABS If you have labs (blood work) drawn today and your tests are completely normal, you will receive your results only by: MyChart Message (if you have MyChart) OR A paper copy in the mail If you have any lab test that is abnormal or we need to change your treatment, we will call you to review the results.   Testing/Procedures: NO TESTING   Follow-Up: At [facility], you and your health needs are our priority.  As part of our continuing mission to provide you with exceptional heart care, we have created designated Provider Care Teams.  These Care Teams include your primary Cardiologist (physician) and Advanced Practice Providers (APPs -  Physician Assistants and Nurse Practitioners) who all work together to provide you with the care you need, when you need it.  Your next appointment:   6 month(s)  Provider:   [name], MD   Other Instructions

## 2023-10-05 ENCOUNTER — Other Ambulatory Visit (HOSPITAL_BASED_OUTPATIENT_CLINIC_OR_DEPARTMENT_OTHER): Payer: Self-pay

## 2023-10-06 ENCOUNTER — Other Ambulatory Visit: Payer: Self-pay

## 2023-10-06 ENCOUNTER — Other Ambulatory Visit: Payer: Self-pay | Admitting: Nurse Practitioner

## 2023-10-06 ENCOUNTER — Other Ambulatory Visit (HOSPITAL_BASED_OUTPATIENT_CLINIC_OR_DEPARTMENT_OTHER): Payer: Self-pay

## 2023-10-06 DIAGNOSIS — G479 Sleep disorder, unspecified: Secondary | ICD-10-CM

## 2023-10-06 NOTE — Telephone Encounter (Signed)
Last apt 02/14/23.

## 2023-10-07 ENCOUNTER — Other Ambulatory Visit (HOSPITAL_BASED_OUTPATIENT_CLINIC_OR_DEPARTMENT_OTHER): Payer: Self-pay

## 2023-10-09 ENCOUNTER — Other Ambulatory Visit (HOSPITAL_BASED_OUTPATIENT_CLINIC_OR_DEPARTMENT_OTHER): Payer: Self-pay

## 2023-10-09 MED ORDER — HYDROXYZINE HCL 25 MG PO TABS
25.0000 mg | ORAL_TABLET | Freq: Every evening | ORAL | 2 refills | Status: DC | PRN
  Filled 2023-10-09: qty 60, 30d supply, fill #0
  Filled 2023-11-04 (×2): qty 60, 30d supply, fill #1
  Filled 2023-12-04: qty 60, 30d supply, fill #2

## 2023-10-10 ENCOUNTER — Other Ambulatory Visit (HOSPITAL_BASED_OUTPATIENT_CLINIC_OR_DEPARTMENT_OTHER): Payer: Self-pay

## 2023-10-17 ENCOUNTER — Ambulatory Visit: Payer: Medicare Other | Admitting: Internal Medicine

## 2023-10-17 ENCOUNTER — Other Ambulatory Visit (HOSPITAL_BASED_OUTPATIENT_CLINIC_OR_DEPARTMENT_OTHER): Payer: Self-pay

## 2023-10-17 ENCOUNTER — Other Ambulatory Visit: Payer: Self-pay | Admitting: Nurse Practitioner

## 2023-10-17 DIAGNOSIS — E119 Type 2 diabetes mellitus without complications: Secondary | ICD-10-CM

## 2023-10-17 MED ORDER — METFORMIN HCL 500 MG PO TABS
500.0000 mg | ORAL_TABLET | Freq: Two times a day (BID) | ORAL | 0 refills | Status: DC
  Filled 2023-10-24: qty 180, 90d supply, fill #0

## 2023-10-20 ENCOUNTER — Other Ambulatory Visit (HOSPITAL_BASED_OUTPATIENT_CLINIC_OR_DEPARTMENT_OTHER): Payer: Self-pay

## 2023-10-20 ENCOUNTER — Other Ambulatory Visit: Payer: Self-pay | Admitting: Nurse Practitioner

## 2023-10-20 ENCOUNTER — Other Ambulatory Visit (HOSPITAL_BASED_OUTPATIENT_CLINIC_OR_DEPARTMENT_OTHER): Payer: Self-pay | Admitting: Cardiology

## 2023-10-20 DIAGNOSIS — F339 Major depressive disorder, recurrent, unspecified: Secondary | ICD-10-CM

## 2023-10-20 DIAGNOSIS — M2550 Pain in unspecified joint: Secondary | ICD-10-CM

## 2023-10-20 DIAGNOSIS — I482 Chronic atrial fibrillation, unspecified: Secondary | ICD-10-CM

## 2023-10-20 MED ORDER — APIXABAN 5 MG PO TABS
5.0000 mg | ORAL_TABLET | Freq: Two times a day (BID) | ORAL | 1 refills | Status: AC
  Filled 2023-11-27: qty 180, 90d supply, fill #0
  Filled 2023-11-29: qty 60, 30d supply, fill #0
  Filled 2024-02-01: qty 180, 90d supply, fill #0
  Filled 2024-09-06: qty 180, 90d supply, fill #1
  Filled 2024-09-09: qty 60, 30d supply, fill #1

## 2023-10-20 NOTE — Telephone Encounter (Signed)
Last apt 02/14/23.

## 2023-10-20 NOTE — Telephone Encounter (Signed)
 Prescription refill request for Eliquis received. Indication:afib Last office visit:1/25 Scr:0.95  1/25 Age: 68 Weight:99.5  kg  Prescription refilled

## 2023-10-23 ENCOUNTER — Other Ambulatory Visit (HOSPITAL_BASED_OUTPATIENT_CLINIC_OR_DEPARTMENT_OTHER): Payer: Self-pay

## 2023-10-23 MED ORDER — HYDROCODONE-ACETAMINOPHEN 5-325 MG PO TABS
1.0000 | ORAL_TABLET | Freq: Two times a day (BID) | ORAL | 0 refills | Status: DC | PRN
  Filled 2023-10-23: qty 120, 30d supply, fill #0

## 2023-10-23 MED ORDER — BUPROPION HCL ER (XL) 150 MG PO TB24
450.0000 mg | ORAL_TABLET | Freq: Every day | ORAL | 3 refills | Status: AC
  Filled 2023-10-23: qty 270, 90d supply, fill #0

## 2023-10-24 ENCOUNTER — Other Ambulatory Visit: Payer: Self-pay

## 2023-10-24 ENCOUNTER — Other Ambulatory Visit (HOSPITAL_BASED_OUTPATIENT_CLINIC_OR_DEPARTMENT_OTHER): Payer: Self-pay

## 2023-10-27 ENCOUNTER — Other Ambulatory Visit (HOSPITAL_BASED_OUTPATIENT_CLINIC_OR_DEPARTMENT_OTHER): Payer: Self-pay

## 2023-10-27 ENCOUNTER — Other Ambulatory Visit: Payer: Self-pay | Admitting: Nurse Practitioner

## 2023-10-27 DIAGNOSIS — G2581 Restless legs syndrome: Secondary | ICD-10-CM

## 2023-10-27 MED ORDER — ROPINIROLE HCL 3 MG PO TABS
ORAL_TABLET | ORAL | 1 refills | Status: DC
Start: 2023-10-27 — End: 2023-12-23
  Filled 2023-10-27: qty 45, 30d supply, fill #0
  Filled 2023-11-24: qty 45, 30d supply, fill #1

## 2023-11-04 ENCOUNTER — Other Ambulatory Visit (HOSPITAL_BASED_OUTPATIENT_CLINIC_OR_DEPARTMENT_OTHER): Payer: Self-pay

## 2023-11-05 ENCOUNTER — Other Ambulatory Visit: Payer: Self-pay | Admitting: Nurse Practitioner

## 2023-11-05 DIAGNOSIS — N471 Phimosis: Secondary | ICD-10-CM

## 2023-11-05 DIAGNOSIS — G4700 Insomnia, unspecified: Secondary | ICD-10-CM

## 2023-11-06 NOTE — Telephone Encounter (Signed)
 Is this okay to refill?

## 2023-11-07 ENCOUNTER — Other Ambulatory Visit (HOSPITAL_BASED_OUTPATIENT_CLINIC_OR_DEPARTMENT_OTHER): Payer: Self-pay

## 2023-11-07 ENCOUNTER — Encounter: Payer: Medicare Other | Attending: Physical Medicine & Rehabilitation | Admitting: Physical Medicine & Rehabilitation

## 2023-11-07 ENCOUNTER — Encounter: Payer: Self-pay | Admitting: Physical Medicine & Rehabilitation

## 2023-11-07 VITALS — BP 137/81 | HR 87 | Ht 68.0 in | Wt 223.0 lb

## 2023-11-07 DIAGNOSIS — M1611 Unilateral primary osteoarthritis, right hip: Secondary | ICD-10-CM | POA: Diagnosis not present

## 2023-11-07 MED ORDER — SILDENAFIL CITRATE 100 MG PO TABS
100.0000 mg | ORAL_TABLET | ORAL | 1 refills | Status: DC
  Filled 2023-11-07: qty 8, 8d supply, fill #0
  Filled 2023-12-04: qty 8, 8d supply, fill #1

## 2023-11-07 MED ORDER — TRAZODONE HCL 150 MG PO TABS
150.0000 mg | ORAL_TABLET | Freq: Every day | ORAL | 1 refills | Status: DC
  Filled 2023-11-07: qty 90, 90d supply, fill #0
  Filled 2023-11-27 – 2024-02-07 (×2): qty 90, 90d supply, fill #1

## 2023-11-07 NOTE — Progress Notes (Signed)
 Subjective:    Patient ID: Shaun [name], male    DOB: Jan 18, 1956, 68 y.o.   MRN: [id number]  HPI 68 year old male with history of severe right hip osteoarthritis who underwent a right hip intra-articular injection under fluoroscopic guidance on 08/11/2023. The patient has seen cardiology given his atrial fibrillation and coronary artery disease to assess for surgical risk in the event he undergoes right hip replacement surgery. Pain Inventory Average Pain 5 Pain Right Now 7 My pain is constant, sharp, and stabbing  In the last 24 hours, has pain interfered with the following? General activity 6 Relation with others 6 Enjoyment of life 6 What TIME of day is your pain at its worst? morning , daytime, evening, and night Sleep (in general) Fair  Pain is worse with: walking, bending, sitting, standing, and some activites Pain improves with: rest, heat/ice, and medication Relief from Meds: 8  Family History  Problem Relation Age of Onset   Hypertension Mother    Breast cancer Mother 80       brain/bone    Heart attack Father    Skin cancer Sister 30   Diabetes Brother    Parkinson's disease Brother    Brain cancer Maternal Uncle    Cancer Maternal Uncle        NOS   Breast cancer Cousin        maternal cousin dx <50   Cancer Cousin    Colon cancer Neg Hx    Social History   Socioeconomic History   Marital status: Married    Spouse name: [name] Much   Number of children: 5   Years of education: 12   Highest education level: 12th grade  Occupational History   Occupation: employed    Associate Professor: Programme researcher, broadcasting/film/video    Comment: full-time  Tobacco Use   Smoking status: Every Day    Current packs/day: 0.00    Average packs/day: 0.5 packs/day for 51.0 years (25.5 ttl pk-yrs)    Types: Cigarettes    Start date: 07/24/1970    Last attempt to quit: 08/06/2021    Years since quitting: 2.2    Passive exposure: Past   Smokeless tobacco: Never   Tobacco comments:     07/10/2023 Patient smokes 10-12 cigarettes daily  Vaping Use   Vaping status: Never Used  Substance and Sexual Activity   Alcohol use: No    Alcohol/week: 0.0 standard drinks of alcohol   Drug use: Yes    Types: Hydrocodone   Sexual activity: Yes    Partners: Female  Other Topics Concern   Not on file  Social History Narrative   Not on file   Social Drivers of Health   Financial Resource Strain: Low Risk  (05/09/2023)   Overall Financial Resource Strain (CARDIA)    Difficulty of Paying Living Expenses: Not hard at all  Recent Concern: Financial Resource Strain - Medium Risk (02/14/2023)   Overall Financial Resource Strain (CARDIA)    Difficulty of Paying Living Expenses: Somewhat hard  Food Insecurity: No Food Insecurity (05/09/2023)   Hunger Vital Sign    Worried About Running Out of Food in the Last Year: Never true    Ran Out of Food in the Last Year: Never true  Recent Concern: Food Insecurity - Food Insecurity Present (02/14/2023)   Hunger Vital Sign    Worried About Running Out of Food in the Last Year: Sometimes true    Ran Out of Food in the Last Year: Never true  Transportation  Needs: No Transportation Needs (05/09/2023)   PRAPARE - Administrator, Civil Service (Medical): No    Lack of Transportation (Non-Medical): No  Physical Activity: Sufficiently Active (05/09/2023)   Exercise Vital Sign    Days of Exercise per Week: 4 days    Minutes of Exercise per Session: 40 min  Stress: No Stress Concern Present (05/09/2023)   [employer] of Occupational Health - Occupational Stress Questionnaire    Feeling of Stress : Only a little  Recent Concern: Stress - Stress Concern Present (02/14/2023)   [employer] of Occupational Health - Occupational Stress Questionnaire    Feeling of Stress : Rather much  Social Connections: Moderately Isolated (05/09/2023)   Social Connection and Isolation Panel [NHANES]    Frequency of Communication with Friends and Family:  Three times a week    Frequency of Social Gatherings with Friends and Family: Once a week    Attends Religious Services: Never    Database administrator or Organizations: No    Attends Banker Meetings: Never    Marital Status: Married   Past Surgical History:  Procedure Laterality Date   BIOPSY  07/17/2018   Procedure: BIOPSY;  Surgeon: West Bali, MD;  [facility];  Service: Endoscopy;;  colon   BOWEL RESECTION  09/17/2018   SMALL BOWEL RESECTION: 71 CM    CARDIAC CATHETERIZATION N/A 07/21/2015   Procedure: Left Heart Cath and Coronary Angiography;  Surgeon: Peter M Swaziland, MD;  Location: [facility];  Service: Cardiovascular;  Laterality: N/A;   CARDIAC CATHETERIZATION N/A 07/21/2015   Procedure: Coronary Stent Intervention;  Surgeon: Peter M Swaziland, MD;  Location: [facility] INVAS[facility] CV LAB;  Service: Cardiovascular;  Laterality: N/A;   CIRCUMCISION N/A 04/05/2019   Procedure: CIRCUMCISION ADULT;  Surgeon: Bjorn Pippin, MD;  Location: [facility];  Service: Urology;  Laterality: N/A;   COLONOSCOPY N/A 05/19/2014   Dr. Cyndi Bender diverticulosis/moderate external hemorrhoids, >20 simple adenomas. Genetic screening negative.    COLONOSCOPY WITH PROPOFOL N/A 07/17/2018   Dr. Darrick Penna: Diverticulosis, external/internal hemorrhoids, 32 colon polyps removed.  ten tubular adenomas removed with no high-grade dysplasia.  Advised to have surveillance colonoscopy in 3 years.   COLONOSCOPY WITH PROPOFOL N/A 06/07/2021   Procedure: COLONOSCOPY WITH PROPOFOL;  Surgeon: Lanelle Bal, DO;  [facility];  Service: Endoscopy;  Laterality: N/A;  9:30 / ASA 3  (Pt was told that his time will be given at Pre-op)   CORONARY PRESSURE/FFR STUDY Left 06/08/2017   Procedure: INTRAVASCULAR PRESSURE WIRE/FFR STUDY;  Surgeon: Yvonne Kendall, MD;  [facility]: [facility];  Service: Cardiovascular;  Laterality: Left;  LAD and CFX   ESOPHAGOGASTRODUODENOSCOPY (EGD) WITH  PROPOFOL N/A 07/17/2018   Dr. Darrick Penna: Low-grade narrowing Schatzki ring at the GE junction status post dilation.  Gastritis.  Biopsy with mild nonspecific reactive gastropathy.  No H. pylori.   GIVENS CAPSULE STUDY N/A 06/24/2019   normal   HERNIA REPAIR  1986   Left inguinal   INCISIONAL HERNIA REPAIR N/A 10/19/2022   Procedure: OPEN INCISIONAL HERNIA REPAIR;  Surgeon: Fritzi Mandes, MD;  Location: [facility] OR;  Service: General;  Laterality: N/A;   INSERTION OF MESH N/A 10/19/2022   Procedure: INSERTION OF MESH;  Surgeon: Fritzi Mandes, MD;  Location: [facility] OR;  Service: General;  Laterality: N/A;   LAPAROTOMY N/A 09/17/2018   Procedure: EXPLORATORY LAPAROTOMY;  Surgeon: Lucretia Roers, MD;  Location: [facility];  Service:  General;  Laterality: N/A;   LEFT HEART CATH AND CORONARY ANGIOGRAPHY N/A 06/08/2017   Procedure: LEFT HEART CATH AND CORONARY ANGIOGRAPHY;  Surgeon: Yvonne Kendall, MD;  [facility];  Service: Cardiovascular;  Laterality: N/A;   POLYPECTOMY  07/17/2018   Procedure: POLYPECTOMY;  Surgeon: West Bali, MD;  [facility];  Service: Endoscopy;;  colon   POLYPECTOMY  06/07/2021   Procedure: POLYPECTOMY INTESTINAL;  Surgeon: Lanelle Bal, DO;  [facility];  Service: Endoscopy;;   RIGHT/LEFT HEART CATH AND CORONARY ANGIOGRAPHY N/A 09/19/2023   Procedure: RIGHT/LEFT HEART CATH AND CORONARY ANGIOGRAPHY;  Surgeon: Kathleene Hazel, MD;  [facility];  Service: Cardiovascular;  Laterality: N/A;   ROTATOR CUFF REPAIR     Right   SAVORY DILATION N/A 07/17/2018   Procedure: SAVORY DILATION;  Surgeon: West Bali, MD;  [facility];  Service: Endoscopy;  Laterality: N/A;   Past Surgical History:  Procedure Laterality Date   BIOPSY  07/17/2018   Procedure: BIOPSY;  Surgeon: West Bali, MD;  [facility];  Service: Endoscopy;;  colon   BOWEL RESECTION  09/17/2018   SMALL BOWEL RESECTION:  71 CM    CARDIAC CATHETERIZATION N/A 07/21/2015   Procedure: Left Heart Cath and Coronary Angiography;  Surgeon: Peter M Swaziland, MD;  Location: [facility] INVASIVE CV LAB;  Service: Cardiovascular;  Laterality: N/A;   CARDIAC CATHETERIZATION N/A 07/21/2015   Procedure: Coronary Stent Intervention;  Surgeon: Peter M Swaziland, MD;  Location: [facility] INVASIVE CV LAB;  Service: Cardiovascular;  Laterality: N/A;   CIRCUMCISION N/A 04/05/2019   Procedure: CIRCUMCISION ADULT;  Surgeon: Bjorn Pippin, MD;  Location: [facility];  Service: Urology;  Laterality: N/A;   COLONOSCOPY N/A 05/19/2014   Dr. Cyndi Bender diverticulosis/moderate external hemorrhoids, >20 simple adenomas. Genetic screening negative.    COLONOSCOPY WITH PROPOFOL N/A 07/17/2018   Dr. Darrick Penna: Diverticulosis, external/internal hemorrhoids, 32 colon polyps removed.  ten tubular adenomas removed with no high-grade dysplasia.  Advised to have surveillance colonoscopy in 3 years.   COLONOSCOPY WITH PROPOFOL N/A 06/07/2021   Procedure: COLONOSCOPY WITH PROPOFOL;  Surgeon: Lanelle Bal, DO;  [facility];  Service: Endoscopy;  Laterality: N/A;  9:30 / ASA 3  (Pt was told that his time will be given at Pre-op)   CORONARY PRESSURE/FFR STUDY Left 06/08/2017   Procedure: INTRAVASCULAR PRESSURE WIRE/FFR STUDY;  Surgeon: Yvonne Kendall, MD;  [facility];  Service: Cardiovascular;  Laterality: Left;  LAD and CFX   ESOPHAGOGASTRODUODENOSCOPY (EGD) WITH PROPOFOL N/A 07/17/2018   Dr. Darrick Penna: Low-grade narrowing Schatzki ring at the GE junction status post dilation.  Gastritis.  Biopsy with mild nonspecific reactive gastropathy.  No H. pylori.   GIVENS CAPSULE STUDY N/A 06/24/2019   normal   HERNIA REPAIR  1986   Left inguinal   INCISIONAL HERNIA REPAIR N/A 10/19/2022   Procedure: OPEN INCISIONAL HERNIA REPAIR;  Surgeon: Fritzi Mandes, MD;  Location: [facility] OR;  Service: General;  Laterality: N/A;   INSERTION OF MESH N/A 10/19/2022    Procedure: INSERTION OF MESH;  Surgeon: Fritzi Mandes, MD;  [facility];  Service: General;  Laterality: N/A;   LAPAROTOMY N/A 09/17/2018   Procedure: EXPLORATORY LAPAROTOMY;  Surgeon: Lucretia Roers, MD;  Location: [facility];  Service: General;  Laterality: N/A;   LEFT HEART CATH AND CORONARY ANGIOGRAPHY N/A 06/08/2017   Procedure: LEFT HEART CATH AND CORONARY ANGIOGRAPHY;  Surgeon: Yvonne Kendall, MD;  [facility];  Service: Cardiovascular;  Laterality: N/A;   POLYPECTOMY  07/17/2018   Procedure: POLYPECTOMY;  Surgeon: West Bali, MD;  [facility];  Service: Endoscopy;;  colon   POLYPECTOMY  06/07/2021   Procedure: POLYPECTOMY INTESTINAL;  Surgeon: Lanelle Bal, DO;  [facility];  Service: Endoscopy;;   RIGHT/LEFT HEART CATH AND CORONARY ANGIOGRAPHY N/A 09/19/2023   Procedure: RIGHT/LEFT HEART CATH AND CORONARY ANGIOGRAPHY;  Surgeon: Kathleene Hazel, MD;  [facility];  Service: Cardiovascular;  Laterality: N/A;   ROTATOR CUFF REPAIR     Right   SAVORY DILATION N/A 07/17/2018   Procedure: SAVORY DILATION;  Surgeon: West Bali, MD;  [facility];  Service: Endoscopy;  Laterality: N/A;   Past Medical History:  Diagnosis Date   A-fib (HCC)    Anemia    Anginal pain (HCC)    Anxiety    Asthma    Bacterial URI 08/29/2022   CAD (coronary artery disease)    DES to distal circumflex 2016   Cataract    CHF (congestive heart failure) (HCC)    Colon polyps    30 colon polyps found on first colonoscopy   Depression    Diastolic heart failure (HCC)    Diverticulitis    DJD (degenerative joint disease)    Dyspnea    Dysrhythmia    A-Fib   Genetic testing 09/09/2014   Negative genetic testing on the ColoNext panel test and the MSH2 inversion testing.  The ColoNext gene panel offered by [facility] and includes sequencing and rearrangement analysis for the following 17 genes: APC, BMPR1A,  CDH1, CHEK2, EPCAM, GREM1, MLH1, MSH2, MSH6, MUTYH, PMS2, POLD1, POLE, PTEN, SMAD4, STK11, and TP53.   The report date is 09/08/14.      GERD (gastroesophageal reflux disease)    History of kidney stones    Hyperlipidemia    Hypernatremia 08/22/2021   Hypertension    Insomnia    Obstructive sleep apnea 12/2009   01/26/2010 AHI 83/hr   Permanent atrial fibrillation (HCC)    Onset 2006 paroxysmal then progressive to persistent   Pneumonia    PUD (peptic ulcer disease)    1980s   RLS (restless legs syndrome)    Sinusitis    Skin cancer    Type 2 diabetes mellitus (HCC)    BP 137/81   Pulse 87   Ht 5\' 8"  (1.727 m)   Wt 223 lb (101.2 kg)   SpO2 95%   BMI 33.91 kg/m   Opioid Risk Score:   Fall Risk Score:  `1  Depression screen [facility] 2/9     11/07/2023    9:42 AM 06/29/2023    9:32 AM 05/09/2023    9:21 AM 12/27/2022    1:24 PM 11/07/2022    9:33 AM 05/31/2022    1:08 PM 04/19/2022   10:17 AM  Depression screen PHQ 2/9  Decreased Interest 0 1 1 0 0 0 0  Down, Depressed, Hopeless 0 1 1 0 0 0 1  PHQ - 2 Score 0 2 2 0 0 0 1  Altered sleeping   2   0 3  Tired, decreased energy   1   0 1  Change in appetite   0   0 2  Feeling bad or failure about yourself    1   0 1  Trouble concentrating   0   0  2  Moving slowly or fidgety/restless   0   0 0  Suicidal thoughts   0   0 0  PHQ-9 Score   6   0 10  Difficult doing work/chores   Somewhat difficult    Somewhat difficult     Review of Systems  Musculoskeletal:  Positive for arthralgias and gait problem.       Right hip  All other systems reviewed and are negative.      Objective:   Physical Exam General No acute distress Mood and affect appropriate Ambulates without assistive device forward flexed posture antalgic gait favoring the left lower extremity. Right hip has 0 external rotation or internal rotation 20 degree right hip flexion contracture Lower extremity strength 5/5 bilateral hip flexors knee extensors ankle  dorsiflexors       Assessment & Plan:  1.  End-stage osteoarthritis right hip with contracture.  Patient has had declining duration of relief with intra-articular injection of the right hip under fluoroscopic guidance with corticosteroids.  He is ready for surgery.  He has obtained cardiology clearance given his history of atrial fibrillation and coronary artery disease. Will make referral to [facility] Magnus [facility] I will see him back on a as needed basis.  If his surgery will not be until a couple more months we may consider repeat intra-articular hip injection prior to surgery

## 2023-11-08 ENCOUNTER — Other Ambulatory Visit: Payer: Self-pay

## 2023-11-13 ENCOUNTER — Other Ambulatory Visit (HOSPITAL_BASED_OUTPATIENT_CLINIC_OR_DEPARTMENT_OTHER): Payer: Self-pay

## 2023-11-13 ENCOUNTER — Other Ambulatory Visit (HOSPITAL_BASED_OUTPATIENT_CLINIC_OR_DEPARTMENT_OTHER): Payer: Self-pay | Admitting: Nurse Practitioner

## 2023-11-13 DIAGNOSIS — I5032 Chronic diastolic (congestive) heart failure: Secondary | ICD-10-CM

## 2023-11-13 DIAGNOSIS — E785 Hyperlipidemia, unspecified: Secondary | ICD-10-CM

## 2023-11-13 DIAGNOSIS — Z01810 Encounter for preprocedural cardiovascular examination: Secondary | ICD-10-CM

## 2023-11-13 MED ORDER — FUROSEMIDE 40 MG PO TABS
ORAL_TABLET | ORAL | 0 refills | Status: AC
Start: 2023-11-13 — End: ?
  Filled 2023-11-13: qty 120, 90d supply, fill #0

## 2023-11-15 ENCOUNTER — Other Ambulatory Visit (INDEPENDENT_AMBULATORY_CARE_PROVIDER_SITE_OTHER): Payer: Self-pay

## 2023-11-15 ENCOUNTER — Ambulatory Visit: Admitting: Orthopaedic Surgery

## 2023-11-15 ENCOUNTER — Encounter: Payer: Self-pay | Admitting: Orthopaedic Surgery

## 2023-11-15 VITALS — Ht 68.0 in | Wt 218.0 lb

## 2023-11-15 DIAGNOSIS — M1611 Unilateral primary osteoarthritis, right hip: Secondary | ICD-10-CM | POA: Diagnosis not present

## 2023-11-15 DIAGNOSIS — M25551 Pain in right hip: Secondary | ICD-10-CM

## 2023-11-15 NOTE — Progress Notes (Signed)
 The patient is a very pleasant 68 year old gentleman I am seeing for the first time.  He is a patient of Dr. Claudette Laws and has been dealing with low back pain but mainly right hip and groin pain for a while now.  He has had intra-articular steroid injections in that right hip and that helped in the past but more recently the injections are not helping.  This is now affecting his posture.  At this point his right hip pain is daily and it is detrimentally affecting his mobility, his quality of life and his actives daily living.  He is a diabetic but reports good blood glucose control.  He is on Eliquis due to A-fib.  He said he has seen his physicians in they have cleared him for any type of surgical intervention if we think this is necessary for his right hip.  He is actually coming in today to discuss right hip replacement surgery.  I was able to review all of his past medical history and medications and notes within epic.  He will does walk with a significant limp and Trendelenburg gait.  His left hip moves smoothly and fluidly but his right hip has significant limitations with internal and external rotation and severe pain in the groin with rotation.  An AP pelvis and lateral the right hip shows osteophytes around the right femoral head with significant arthritis of the right hip.  There is joint space narrowing as well.  We had a long and thorough discussion about hip replacement surgery.  We would need him to stop his Eliquis 3 full days prior to surgery in order to have spinal anesthesia.  He is a smoker and I have encouraged him to stop smoking.  He will continue to work on good glucose control.  We went over hip replacement surgery in terms of the risk and benefits of surgery and what to expect fact from an intraoperative and postoperative standpoint.  All question concerns were answered and addressed.  Will work on getting him scheduled hopefully in the near future.

## 2023-11-17 ENCOUNTER — Ambulatory Visit: Payer: Self-pay | Admitting: *Deleted

## 2023-11-17 ENCOUNTER — Other Ambulatory Visit (HOSPITAL_BASED_OUTPATIENT_CLINIC_OR_DEPARTMENT_OTHER): Payer: Self-pay

## 2023-11-17 ENCOUNTER — Encounter: Payer: Self-pay | Admitting: Family

## 2023-11-17 ENCOUNTER — Ambulatory Visit: Admitting: Family

## 2023-11-17 VITALS — BP 126/80 | HR 80 | Temp 97.8°F | Ht 68.0 in | Wt 220.0 lb

## 2023-11-17 DIAGNOSIS — J219 Acute bronchiolitis, unspecified: Secondary | ICD-10-CM | POA: Diagnosis not present

## 2023-11-17 DIAGNOSIS — F17200 Nicotine dependence, unspecified, uncomplicated: Secondary | ICD-10-CM

## 2023-11-17 DIAGNOSIS — R051 Acute cough: Secondary | ICD-10-CM | POA: Diagnosis not present

## 2023-11-17 MED ORDER — PREDNISONE 20 MG PO TABS
40.0000 mg | ORAL_TABLET | Freq: Every day | ORAL | 0 refills | Status: DC
  Filled 2023-11-17: qty 10, 5d supply, fill #0

## 2023-11-17 MED ORDER — DOXYCYCLINE HYCLATE 100 MG PO TABS
100.0000 mg | ORAL_TABLET | Freq: Two times a day (BID) | ORAL | 0 refills | Status: DC
  Filled 2023-11-17: qty 20, 10d supply, fill #0

## 2023-11-17 MED ORDER — ALBUTEROL SULFATE (2.5 MG/3ML) 0.083% IN NEBU
2.5000 mg | INHALATION_SOLUTION | Freq: Once | RESPIRATORY_TRACT | Status: AC
  Administered 2023-11-17: 2.5 mg via RESPIRATORY_TRACT

## 2023-11-17 MED ORDER — BENZONATATE 100 MG PO CAPS
200.0000 mg | ORAL_CAPSULE | Freq: Three times a day (TID) | ORAL | 0 refills | Status: DC | PRN
  Filled 2023-11-17: qty 20, 4d supply, fill #0

## 2023-11-17 NOTE — Progress Notes (Signed)
 Acute Office Visit  Subjective:     Patient ID: Shaun [name], male    DOB: [birth year], 68 y.o.   MRN: [id number]  Chief Complaint  Patient presents with  . Cough    Productive cough for 2 days, giving him sore throat. SOB with cough  . Shortness of Breath    Laying down or back having to "catch breath"    HPI Patient is in today with complaints of a productive cough with yellow phlegm x 3 days.  Reports being short of breath and having some mild wheezing.  He is a smoker of about a pack of cigarettes per day.  Has been taking over-the-counter medications without much relief.  Denies any fever or chills.  He is accompanied by his wife today.  Review of Systems  Constitutional:  Negative for chills and fever.  HENT:  Positive for congestion.   Respiratory:  Positive for cough, sputum production and wheezing.   Cardiovascular: Negative.  Negative for chest pain.  Gastrointestinal: Negative.  Negative for nausea and vomiting.  Musculoskeletal: Negative.   Neurological: Negative.   Psychiatric/Behavioral: Negative.    All other systems reviewed and are negative.      Objective:    BP 126/80 (BP Location: Left Arm, Patient Position: Sitting)   Pulse 80   Temp 97.8 F (36.6 C) (Temporal)   Ht 5\' 8"  (1.727 m)   Wt 220 lb (99.8 kg)   SpO2 95%   BMI 33.45 kg/m    Physical Exam Vitals and nursing note reviewed.  Constitutional:      Appearance: He is well-developed. He is obese.  Cardiovascular:     Rate and Rhythm: Normal rate and regular rhythm.  Pulmonary:     Effort: Pulmonary effort is normal.     Breath sounds: Examination of the right-upper field reveals wheezing. Examination of the left-upper field reveals wheezing. Examination of the right-middle field reveals wheezing. Examination of the left-middle field reveals wheezing. Wheezing present. No rhonchi.  Abdominal:     Palpations: Abdomen is soft.  Musculoskeletal:        General: Normal range of motion.   Neurological:     General: No focal deficit present.     Mental Status: He is alert and oriented to person, place, and time.  Psychiatric:        Mood and Affect: Mood normal.        Behavior: Behavior normal.   No results found for any visits on 11/17/23.      Assessment & Plan:   Problem List Items Addressed This Visit     Tobacco use disorder   Other Visit Diagnoses       Acute bronchiolitis due to unspecified organism    -  Primary     Acute cough           Meds ordered this encounter  Medications  . predniSONE (DELTASONE) 20 MG tablet    Sig: Take 2 tablets (40 mg total) by mouth daily with breakfast.    Dispense:  10 tablet    Refill:  0  . doxycycline (VIBRA-TABS) 100 MG tablet    Sig: Take 1 tablet (100 mg total) by mouth 2 (two) times daily.    Dispense:  20 tablet    Refill:  0  . benzonatate (TESSALON PERLES) 100 MG capsule    Sig: Take 2 capsules (200 mg total) by mouth 3 (three) times daily as needed for cough.    Dispense:  20 capsule    Refill:  0   Call the office if symptoms worsen or persist.  Recheck as scheduled and sooner as needed. No follow-ups on file.  Eulis Foster, FNP

## 2023-11-17 NOTE — Telephone Encounter (Signed)
  Chief Complaint: cough, SOB with cough Symptoms: cough, chest congestion, SOB with cough, hx lung disease  Frequency: started Wednesday  Disposition: [] ED /[] Urgent Care (no appt availability in office) / [x] Appointment(In office/virtual)/ []  [facility]/ [] Home Care/ [] Refused Recommended Disposition /[] [facility]/ []  Follow-up with PCP Additional Notes: Patient has been scheduled for appointment- outside office. Patient advised if he gets worse- ED  Copied from CRM [id number]. Topic: Clinical - Red Word Triage >> Nov 17, 2023 10:31 AM Albin Felling L wrote: Red Word that prompted transfer to Nurse Triage: Congestion, cough, shortness of breath Reason for Disposition  [1] MILD difficulty breathing (e.g., minimal/no SOB at rest, SOB with walking, pulse <100) AND [2] NEW-onset or WORSE than normal  Answer Assessment - Initial Assessment Questions 1. RESPIRATORY STATUS: "Describe your breathing?" (e.g., wheezing, shortness of breath, unable to speak, severe coughing)      With cough-has trouble catching breath 2. ONSET: "When did this breathing problem begin?"      Started Wednesday 3. PATTERN "Does the difficult breathing come and go, or has it been constant since it started?"      With cough 4. SEVERITY: "How bad is your breathing?" (e.g., mild, moderate, severe)    - MILD: No SOB at rest, mild SOB with walking, speaks normally in sentences, can lie down, no retractions, pulse < 100.    - MODERATE: SOB at rest, SOB with minimal exertion and prefers to sit, cannot lie down flat, speaks in phrases, mild retractions, audible wheezing, pulse 100-120.    - SEVERE: Very SOB at rest, speaks in single words, struggling to breathe, sitting hunched forward, retractions, pulse > 120      Mild/moderate 5. RECURRENT SYMPTOM: "Have you had difficulty breathing before?" If Yes, ask: "When was the last time?" and "What happened that time?"      Yes- was hospitalized  7. LUNG HISTORY: "Do  you have any history of lung disease?"  (e.g., pulmonary embolus, asthma, emphysema)     COPD,emphysema 8. CAUSE: "What do you think is causing the breathing problem?"      Cough, congestion 9. OTHER SYMPTOMS: "Do you have any other symptoms? (e.g., dizziness, runny nose, cough, chest pain, fever)     Cough, chest congestion 10. O2 SATURATION MONITOR:  "Do you use an oxygen saturation monitor (pulse oximeter) at home?" If Yes, ask: "What is your reading (oxygen level) today?" "What is your usual oxygen saturation reading?" (e.g., 95%) 93%  Protocols used: Breathing Difficulty-A-AH

## 2023-11-27 ENCOUNTER — Other Ambulatory Visit: Payer: Self-pay

## 2023-11-27 ENCOUNTER — Other Ambulatory Visit (HOSPITAL_BASED_OUTPATIENT_CLINIC_OR_DEPARTMENT_OTHER): Payer: Self-pay

## 2023-11-29 ENCOUNTER — Other Ambulatory Visit (HOSPITAL_BASED_OUTPATIENT_CLINIC_OR_DEPARTMENT_OTHER): Payer: Self-pay

## 2023-11-29 ENCOUNTER — Other Ambulatory Visit: Payer: Self-pay | Admitting: Nurse Practitioner

## 2023-11-29 MED ORDER — DOCUSATE SODIUM 100 MG PO CAPS
100.0000 mg | ORAL_CAPSULE | Freq: Every day | ORAL | 3 refills | Status: AC | PRN
  Filled 2023-11-29: qty 100, 100d supply, fill #0
  Filled 2024-05-26: qty 100, 100d supply, fill #1
  Filled 2024-09-30: qty 100, 100d supply, fill #2

## 2023-12-01 ENCOUNTER — Telehealth: Payer: Self-pay

## 2023-12-01 NOTE — Telephone Encounter (Signed)
 I called patient to discuss scheduling surgery.  Left voice mail message for return call.

## 2023-12-04 ENCOUNTER — Other Ambulatory Visit (HOSPITAL_BASED_OUTPATIENT_CLINIC_OR_DEPARTMENT_OTHER): Payer: Self-pay

## 2023-12-04 ENCOUNTER — Telehealth: Payer: Self-pay | Admitting: *Deleted

## 2023-12-04 NOTE — Telephone Encounter (Signed)
   Pre-operative Risk Assessment    Patient Name: Shaun [name]  DOB: 07/08/1956 MRN: [id number]   Date of last office visit: 09/29/23 HAO MENG, PAC Date of next office visit: NONE   Request for Surgical Clearance    Procedure:   RIGHT TOTAL HIP ARTHROPLASTY  Date of Surgery:  Clearance TBD                                Surgeon:  DR. Doneen Poisson Surgeon's Group or Practice Name:  [facility] Phone number:  [phone] Fax number:  [phone] SHERRI   Type of Clearance Requested:   - Medical  - Pharmacy:  Hold Apixaban (Eliquis) x 3 DAYS PRIOR   Type of Anesthesia:  Spinal   Additional requests/questions:    Elpidio Anis   12/04/2023, 6:07 PM

## 2023-12-05 ENCOUNTER — Other Ambulatory Visit (HOSPITAL_BASED_OUTPATIENT_CLINIC_OR_DEPARTMENT_OTHER): Payer: Self-pay

## 2023-12-05 ENCOUNTER — Ambulatory Visit: Admitting: Nurse Practitioner

## 2023-12-05 ENCOUNTER — Encounter: Payer: Self-pay | Admitting: Nurse Practitioner

## 2023-12-05 VITALS — BP 122/74 | HR 104 | Ht 68.5 in | Wt 224.2 lb

## 2023-12-05 DIAGNOSIS — E559 Vitamin D deficiency, unspecified: Secondary | ICD-10-CM | POA: Insufficient documentation

## 2023-12-05 DIAGNOSIS — E669 Obesity, unspecified: Secondary | ICD-10-CM

## 2023-12-05 DIAGNOSIS — E1122 Type 2 diabetes mellitus with diabetic chronic kidney disease: Secondary | ICD-10-CM | POA: Diagnosis not present

## 2023-12-05 DIAGNOSIS — J439 Emphysema, unspecified: Secondary | ICD-10-CM | POA: Diagnosis not present

## 2023-12-05 DIAGNOSIS — I5032 Chronic diastolic (congestive) heart failure: Secondary | ICD-10-CM

## 2023-12-05 DIAGNOSIS — I482 Chronic atrial fibrillation, unspecified: Secondary | ICD-10-CM

## 2023-12-05 DIAGNOSIS — D649 Anemia, unspecified: Secondary | ICD-10-CM

## 2023-12-05 DIAGNOSIS — F321 Major depressive disorder, single episode, moderate: Secondary | ICD-10-CM

## 2023-12-05 DIAGNOSIS — E1165 Type 2 diabetes mellitus with hyperglycemia: Secondary | ICD-10-CM

## 2023-12-05 DIAGNOSIS — Z23 Encounter for immunization: Secondary | ICD-10-CM | POA: Diagnosis not present

## 2023-12-05 DIAGNOSIS — D6859 Other primary thrombophilia: Secondary | ICD-10-CM

## 2023-12-05 DIAGNOSIS — Z0000 Encounter for general adult medical examination without abnormal findings: Secondary | ICD-10-CM | POA: Diagnosis not present

## 2023-12-05 DIAGNOSIS — I25118 Atherosclerotic heart disease of native coronary artery with other forms of angina pectoris: Secondary | ICD-10-CM

## 2023-12-05 DIAGNOSIS — Z720 Tobacco use: Secondary | ICD-10-CM | POA: Insufficient documentation

## 2023-12-05 MED ORDER — NICOTINE 7 MG/24HR TD PT24
MEDICATED_PATCH | TRANSDERMAL | 0 refills | Status: DC
  Filled 2023-12-05: qty 28, fill #0
  Filled 2023-12-06 – 2024-03-14 (×3): qty 28, 28d supply, fill #0

## 2023-12-05 MED ORDER — NICOTINE 21 MG/24HR TD PT24
MEDICATED_PATCH | TRANSDERMAL | 0 refills | Status: DC
  Filled 2023-12-05: qty 14, 14d supply, fill #0

## 2023-12-05 MED ORDER — NICOTINE 14 MG/24HR TD PT24
MEDICATED_PATCH | TRANSDERMAL | 0 refills | Status: DC
  Filled 2023-12-05 – 2023-12-06 (×2): qty 14, 14d supply, fill #0

## 2023-12-05 MED ORDER — DAPAGLIFLOZIN PROPANEDIOL 10 MG PO TABS: 10.0000 mg | ORAL_TABLET | Freq: Every day | ORAL | Status: DC

## 2023-12-05 NOTE — Assessment & Plan Note (Addendum)
 Repeat labs. No alarm symptoms.

## 2023-12-05 NOTE — Assessment & Plan Note (Signed)
 Smokes half to three-quarters of a pack of cigarettes per day and desires to quit. Previous attempts to quit cold [place] were unsuccessful. Concerned about interactions with over-the-counter cessation aids. Nicotine patch therapy is recommended to taper nicotine intake while finding alternative activities to replace smoking habits. - Initiate nicotine patch therapy starting with 21 mg for the first two weeks, then taper to 14 mg for weeks three and four, and 7 mg for weeks five and six. - Provide a step-by-step guide for smoking cessation. - Encourage finding alternative activities to replace smoking habits, such as crossword puzzles. - Discuss the potential use of Chantix if needed

## 2023-12-05 NOTE — Patient Instructions (Addendum)
 What is Essential Tremor? Essential tremor is a condition that causes parts of your body -- usually your hands -- to shake without meaning to.  It's not dangerous, but it can make things like writing, drinking from a cup, or using tools harder.  Key Points: It's the most common kind of tremor.  The shaking usually happens when you're moving (like reaching for something), not when you're resting.  It can affect the hands, head, voice, and sometimes the legs.  It's not the same as Parkinson's disease (that tremor usually happens at rest).  Who gets it? It often runs in families -- you can inherit it.  It can start at any age, but it's more common as people get older.  Is it serious? It's usually not dangerous.  Some people have mild shaking, and others have more noticeable tremors.  There are medications, therapies, and even devices that can help manage it.  Most Common Medications for Essential Tremor: 1. Propranolol (a beta-blocker)  Originally used for blood pressure, but helps calm the tremors.  Often the first choice.  Works best for hand tremors.  May not be good for people with asthma, low heart rate, or low blood pressure.  2. Primidone (an anti-seizure medicine)  Also often used as a first-line option.  Can be helpful when propranolol doesn't work or isn't tolerated.  Side effects can include sleepiness, dizziness, or feeling groggy at first.

## 2023-12-05 NOTE — Progress Notes (Signed)
 BP 122/74   Pulse (!) 104   Ht 5' 8.5" (1.74 m)   Wt 224 lb 3.2 oz (101.7 kg)   BMI 33.59 kg/m    Subjective:    Patient ID: Shaun [name], male    DOB: February 14, 1956, 67 y.o.   MRN: [id number]  HPI: Shaun [name] is a 68 y.o. male presenting on 12/05/2023 for comprehensive medical examination.   History of Present Illness The patient presents for CPE. He has concerns with hand tremors and smoking cessation.  He experiences shaking in his hands, previously evaluated by a neurologist in 2022. An MRI of the brain was performed and did not reveal any concerning findings. The tremors have been identified as essential tremors, but he is unsure what this means. They interfere with daily activities such as eating, writing, and working in his shop. He is currently on metoprolol but is open to switching to propranolol or trying primidone to manage the tremors if this is approved by cardiology.   He is seeking assistance with smoking cessation. He currently smokes between half to three-quarters of a pack of cigarettes per day and has attempted to quit cold [place] in the past. 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 He wants direction in quitting and is considering nicotine patches and other strategies to manage the habit.  He is preparing for hip surgery at the end of the month and is concerned about his blood sugar levels, as his surgeon indicated that surgery would not proceed if his A1c is above 7. He has been off Jardiance due to cost and is exploring alternatives to manage his diabetes. His blood sugar levels have not been optimal, and he is concerned about the impact on his upcoming surgery.  He travels frequently to attend his grandson's baseball games and enjoys working in his shop.  Pertinent items are noted in HPI.   Most Recent Depression Screen:     12/05/2023   10:27 AM 11/07/2023    9:42 AM 06/29/2023    9:32  AM 05/09/2023    9:21 AM 12/27/2022    1:24 PM  Depression screen PHQ 2/9  Decreased Interest 0 0 1 1 0  Down, Depressed, Hopeless 0 0 1 1 0  PHQ - 2 Score 0 0 2 2 0  Altered sleeping    2   Tired, decreased energy    1   Change in appetite    0   Feeling bad or failure about yourself     1   Trouble concentrating    0   Moving slowly or fidgety/restless    0   Suicidal thoughts    0   PHQ-9 Score    6   Difficult doing work/chores    Somewhat difficult    Most Recent Anxiety Screen:     No data to display         Most Recent Falls Screen:    12/05/2023   10:27 AM 11/07/2023    9:42 AM 08/11/2023    9:25 AM 06/29/2023    9:32 AM 05/09/2023    9:20 AM  Fall Risk   Falls in the past year? 0 0 0 1 1  Comment     lost balance  Number falls in past yr: 0   1 1  Comment    last in august   Injury with Fall? 0   0 1  Comment     cut head  Risk for fall due to : No Fall Risks   History of fall(s);Impaired balance/gait History of fall(s);Impaired balance/gait;Impaired mobility;Medication side effect  Follow up Falls evaluation completed    Falls prevention discussed;Falls evaluation completed    Past medical history, surgical history, medications, allergies, family history and social history reviewed with patient today and changes made to appropriate areas of the chart.  Past Medical History:  Past Medical History:  Diagnosis Date   A-fib (HCC)    Anemia    Anginal pain (HCC)    Anxiety    Asthma    Bacterial URI 08/29/2022   CAD (coronary artery disease)    DES to distal circumflex 2016   Cataract    CHF (congestive heart failure) (HCC)    Colon polyps    30 colon polyps found on first colonoscopy   Depression    Diastolic heart failure (HCC)    Diverticulitis    DJD (degenerative joint disease)    Dyspnea    Dysrhythmia    A-Fib   Genetic testing 09/09/2014   Negative genetic testing on the ColoNext panel test and the MSH2 inversion testing.  The ColoNext gene  panel offered by [facility] and includes sequencing and rearrangement analysis for the following 17 genes: APC, BMPR1A, CDH1, CHEK2, EPCAM, GREM1, MLH1, MSH2, MSH6, MUTYH, PMS2, POLD1, POLE, PTEN, SMAD4, STK11, and TP53.   The report date is 09/08/14.      GERD (gastroesophageal reflux disease)    History of kidney stones    Hyperlipidemia    Hypernatremia 08/22/2021   Hypertension    Insomnia    Obstructive sleep apnea 12/2009   01/26/2010 AHI 83/hr   Permanent atrial fibrillation (HCC)    Onset 2006 paroxysmal then progressive to persistent   Pneumonia    PUD (peptic ulcer disease)    1980s   RLS (restless legs syndrome)    Sinusitis    Skin cancer    Type 2 diabetes mellitus (HCC)    Medications:  Current Outpatient Medications on File Prior to Visit  Medication Sig   albuterol (PROVENTIL) (2.5 MG/3ML) 0.083% nebulizer solution Take 3 mLs (2.5 mg total) by nebulization every 6 (six) hours as needed for wheezing or shortness of breath.   albuterol (VENTOLIN HFA) 108 (90 Base) MCG/ACT inhaler INHALE 2 PUFFS INTO THE LUNGS EVERY 6 HOURS AS NEEDED FOR WHEEZING OR SHORTNESS OF BREATH   apixaban (ELIQUIS) 5 MG TABS tablet Take 1 tablet (5 mg total) by mouth 2 (two) times daily.   ascorbic acid (VITAMIN C) 500 MG tablet Take 500 mg by mouth daily.   atorvastatin (LIPITOR) 80 MG tablet Take 1 tablet (80 mg total) by mouth every evening.   buPROPion (WELLBUTRIN XL) 150 MG 24 hr tablet Take 3 tablets (450 mg total) by mouth daily.   docusate sodium (COLACE) 100 MG capsule Take 1 capsule (100 mg total) by mouth daily as needed for mild constipation.   escitalopram (LEXAPRO) 20 MG tablet Take 20 mg by mouth in the morning.   ezetimibe (ZETIA) 10 MG tablet Take 1 tablet (10 mg total) by mouth daily.   ferrous sulfate (FEROSUL) 325 (65 FE) MG tablet Take 1 tablet (325 mg total) by mouth 3 (three) times a week.   furosemide (LASIX) 40 MG tablet Take 1 tablet (40mg ) daily. May take 1/2 tablet  (20mg ) additional if shortness of breath or swelling occurs.   HYDROcodone-acetaminophen (NORCO/VICODIN) 5-325 MG tablet Take  1-2 tablets by mouth 2 (two) times daily as needed for moderate pain (pain score 4-6) or severe pain (pain score 7-10).   hydrOXYzine (ATARAX) 25 MG tablet Take 1 tablet (25 mg total) by mouth at bedtime and may repeat dose one time if needed for sleep.   metFORMIN (GLUCOPHAGE) 500 MG tablet Take 1 tablet (500 mg total) by mouth 2 (two) times daily before a meal.   methimazole (TAPAZOLE) 5 MG tablet Take 1 tablet (5 mg total) by mouth 4 days a week.   metoprolol tartrate (LOPRESSOR) 100 MG tablet Take 1 tablet (100 mg total) by mouth 2 (two) times daily.   nitroGLYCERIN (NITROSTAT) 0.4 MG SL tablet Place 1 tablet (0.4 mg total) under the tongue every 5 (five) minutes as needed.   pantoprazole (PROTONIX) 40 MG tablet Take 1 tablet (40 mg total) by mouth daily before breakfast.   polyethylene glycol powder (GLYCOLAX/MIRALAX) 17 GM/SCOOP powder Take one capful (17g) twice daily until soft stool, then continue one capful once daily as needed.   potassium chloride SA (KLOR-CON M) 20 MEQ tablet Take 1 tablet (20 mEq total) by mouth daily.   rOPINIRole (REQUIP) 3 MG tablet Take 1 tablet by mouth nightly for restless leg, and a 1/2 tab during the day to control breakthrough symptoms   sildenafil (VIAGRA) 100 MG tablet Take 1 tablet (100 mg total) by mouth 30 minutes before activity   tiZANidine (ZANAFLEX) 4 MG tablet Take 1 tablet (4 mg total) by mouth 2 (two) times daily as needed for muscle spasms.   traZODone (DESYREL) 150 MG tablet Take 1 tablet (150 mg total) by mouth at bedtime.   doxycycline (VIBRA-TABS) 100 MG tablet Take 1 tablet (100 mg total) by mouth 2 (two) times daily. (Patient not taking: Reported on 12/05/2023)   Current Facility-Administered Medications on File Prior to Visit  Medication   lidocaine (XYLOCAINE) 1 % (with pres) injection 5 mL   Surgical History:   Past Surgical History:  Procedure Laterality Date   BIOPSY  07/17/2018   Procedure: BIOPSY;  Surgeon: West Bali, MD;  [facility]: [facility];  Service: Endoscopy;;  colon   BOWEL RESECTION  09/17/2018   SMALL BOWEL RESECTION: 71 CM    CARDIAC CATHETERIZATION N/A 07/21/2015   Procedure: Left Heart Cath and Coronary Angiography;  Surgeon: Peter M Swaziland, MD;  Location: [facility] INVASIVE CV LAB;  Service: Cardiovascular;  Laterality: N/A;   CARDIAC CATHETERIZATION N/A 07/21/2015   Procedure: Coronary Stent Intervention;  Surgeon: Peter M Swaziland, MD;  Location: [facility] INVASIVE CV LAB;  Service: Cardiovascular;  Laterality: N/A;   CIRCUMCISION N/A 04/05/2019   Procedure: CIRCUMCISION ADULT;  Surgeon: Bjorn Pippin, MD;  Location: [facility] ORS;  Service: Urology;  Laterality: N/A;   COLONOSCOPY N/A 05/19/2014   Dr. Cyndi Bender diverticulosis/moderate external hemorrhoids, >20 simple adenomas. Genetic screening negative.    COLONOSCOPY WITH PROPOFOL N/A 07/17/2018   Dr. Darrick Penna: Diverticulosis, external/internal hemorrhoids, 32 colon polyps removed.  ten tubular adenomas removed with no high-grade dysplasia.  Advised to have surveillance colonoscopy in 3 years.   COLONOSCOPY WITH PROPOFOL N/A 06/07/2021   Procedure: COLONOSCOPY WITH PROPOFOL;  Surgeon: Lanelle Bal, DO;  [facility]: [facility];  Service: Endoscopy;  Laterality: N/A;  9:30 / ASA 3  (Pt was told that his time will be given at Pre-op)   CORONARY PRESSURE/FFR STUDY Left 06/08/2017   Procedure: INTRAVASCULAR PRESSURE WIRE/FFR STUDY;  Surgeon: Yvonne Kendall, MD;  Location: [facility] INVASIVE CV LAB;  Service:  Cardiovascular;  Laterality: Left;  LAD and CFX   ESOPHAGOGASTRODUODENOSCOPY (EGD) WITH PROPOFOL N/A 07/17/2018   Dr. Darrick Penna: Low-grade narrowing Schatzki ring at the GE junction status post dilation.  Gastritis.  Biopsy with mild nonspecific reactive gastropathy.  No H. pylori.   GIVENS CAPSULE STUDY N/A 06/24/2019   normal   HERNIA REPAIR   1986   Left inguinal   INCISIONAL HERNIA REPAIR N/A 10/19/2022   Procedure: OPEN INCISIONAL HERNIA REPAIR;  Surgeon: Fritzi Mandes, MD;  Location: [facility] OR;  Service: General;  Laterality: N/A;   INSERTION OF MESH N/A 10/19/2022   Procedure: INSERTION OF MESH;  Surgeon: Fritzi Mandes, MD;  Location: [facility] OR;  Service: General;  Laterality: N/A;   LAPAROTOMY N/A 09/17/2018   Procedure: EXPLORATORY LAPAROTOMY;  Surgeon: Lucretia Roers, MD;  Location: [facility];  Service: General;  Laterality: N/A;   LEFT HEART CATH AND CORONARY ANGIOGRAPHY N/A 06/08/2017   Procedure: LEFT HEART CATH AND CORONARY ANGIOGRAPHY;  Surgeon: Yvonne Kendall, MD;  Location: [facility] INVASIVE CV LAB;  Service: Cardiovascular;  Laterality: N/A;   POLYPECTOMY  07/17/2018   Procedure: POLYPECTOMY;  Surgeon: West Bali, MD;  [facility];  Service: Endoscopy;;  colon   POLYPECTOMY  06/07/2021   Procedure: POLYPECTOMY INTESTINAL;  Surgeon: Lanelle Bal, DO;  [facility];  Service: Endoscopy;;   RIGHT/LEFT HEART CATH AND CORONARY ANGIOGRAPHY N/A 09/19/2023   Procedure: RIGHT/LEFT HEART CATH AND CORONARY ANGIOGRAPHY;  Surgeon: Kathleene Hazel, MD;  Location: [facility] INVASIVE CV LAB;  Service: Cardiovascular;  Laterality: N/A;   ROTATOR CUFF REPAIR     Right   SAVORY DILATION N/A 07/17/2018   Procedure: SAVORY DILATION;  Surgeon: West Bali, MD;  [facility]: [facility];  Service: Endoscopy;  Laterality: N/A;   Allergies:  No Known Allergies Social History:  Social History   Socioeconomic History   Marital status: Married    Spouse name: [name] Diguglielmo   Number of children: 5   Years of education: 12   Highest education level: 12th grade  Occupational History   Occupation: employed    Associate Professor: Programme researcher, broadcasting/film/video    Comment: full-time  Tobacco Use   Smoking status: Every Day    Current packs/day: 0.00    Average packs/day: 0.5 packs/day for 51.0 years (25.5 ttl pk-yrs)    Types: Cigarettes     Start date: 07/24/1970    Last attempt to quit: 08/06/2021    Years since quitting: 2.3    Passive exposure: Past   Smokeless tobacco: Never   Tobacco comments:    07/10/2023 Patient smokes 10-12 cigarettes daily  Vaping Use   Vaping status: Never Used  Substance and Sexual Activity   Alcohol use: No    Alcohol/week: 0.0 standard drinks of alcohol   Drug use: Yes    Types: Hydrocodone   Sexual activity: Yes    Partners: Female  Other Topics Concern   Not on file  Social History Narrative   Not on file   Social Drivers of Health   Financial Resource Strain: Low Risk  (12/05/2023)   Overall Financial Resource Strain (CARDIA)    Difficulty of Paying Living Expenses: Not very hard  Food Insecurity: No Food Insecurity (12/05/2023)   Hunger Vital Sign    Worried About Running Out of Food in the Last Year: Never true    Ran Out of Food in the Last Year: Never true  Transportation Needs: No Transportation Needs (12/05/2023)  PRAPARE - Administrator, Civil Service (Medical): No    Lack of Transportation (Non-Medical): No  Physical Activity: Insufficiently Active (12/05/2023)   Exercise Vital Sign    Days of Exercise per Week: 5 days    Minutes of Exercise per Session: 10 min  Stress: Stress Concern Present (12/05/2023)   [employer] of Occupational Health - Occupational Stress Questionnaire    Feeling of Stress : Very much  Social Connections: Moderately Isolated (12/05/2023)   Social Connection and Isolation Panel [NHANES]    Frequency of Communication with Friends and Family: More than three times a week    Frequency of Social Gatherings with Friends and Family: Twice a week    Attends Religious Services: Never    Database administrator or Organizations: No    Attends Banker Meetings: Never    Marital Status: Married  Catering manager Violence: Not At Risk (12/05/2023)   Humiliation, Afraid, Rape, and Kick questionnaire    Fear of Current or  Ex-Partner: No    Emotionally Abused: No    Physically Abused: No    Sexually Abused: No   Social History   Tobacco Use  Smoking Status Every Day   Current packs/day: 0.00   Average packs/day: 0.5 packs/day for 51.0 years (25.5 ttl pk-yrs)   Types: Cigarettes   Start date: 07/24/1970   Last attempt to quit: 08/06/2021   Years since quitting: 2.3   Passive exposure: Past  Smokeless Tobacco Never  Tobacco Comments   07/10/2023 Patient smokes 10-12 cigarettes daily   Social History   Substance and Sexual Activity  Alcohol Use No   Alcohol/week: 0.0 standard drinks of alcohol   Family History:  Family History  Problem Relation Age of Onset   Hypertension Mother    Breast cancer Mother 54       brain/bone    Heart attack Father    Skin cancer Sister 22   Diabetes Brother    Parkinson's disease Brother    Brain cancer Maternal Uncle    Cancer Maternal Uncle        NOS   Breast cancer Cousin        maternal cousin dx <50   Cancer Cousin    Colon cancer Neg Hx        Objective:    BP 122/74   Pulse (!) 104   Ht 5' 8.5" (1.74 m)   Wt 224 lb 3.2 oz (101.7 kg)   BMI 33.59 kg/m   Wt Readings from Last 3 Encounters:  12/05/23 224 lb 3.2 oz (101.7 kg)  11/17/23 220 lb (99.8 kg)  11/15/23 218 lb (98.9 kg)    Physical Exam Vitals and nursing note reviewed.  Constitutional:      General: He is not in acute distress.    Appearance: Normal appearance. He is obese.  HENT:     Head: Normocephalic.     Right Ear: Tympanic membrane normal.     Left Ear: Tympanic membrane normal.     Nose: Nose normal.     Mouth/Throat:     Mouth: Mucous membranes are moist.     Pharynx: Oropharynx is clear.  Eyes:     Conjunctiva/sclera: Conjunctivae normal.  Neck:     Vascular: No carotid bruit.  Cardiovascular:     Rate and Rhythm: Tachycardia present. Rhythm irregular.     Pulses: Normal pulses.     Heart sounds: Normal heart sounds.  Pulmonary:  Effort: Pulmonary effort  is normal.     Breath sounds: Normal breath sounds.  Abdominal:     General: Bowel sounds are normal. There is no distension.     Palpations: Abdomen is soft.     Tenderness: There is no abdominal tenderness. There is no guarding.  Musculoskeletal:     Cervical back: Normal range of motion and neck supple. No tenderness.     Right lower leg: No edema.     Left lower leg: No edema.  Lymphadenopathy:     Cervical: No cervical adenopathy.  Skin:    General: Skin is warm and dry.     Capillary Refill: Capillary refill takes less than 2 seconds.  Neurological:     Mental Status: He is alert and oriented to person, place, and time.  Psychiatric:        Mood and Affect: Mood normal.        Behavior: Behavior normal.      Results for orders placed or performed in visit on 09/25/23  TSH   Collection Time: 09/25/23  9:04 AM  Result Value Ref Range   TSH 1.35 0.40 - 4.50 mIU/L  T4, free   Collection Time: 09/25/23  9:04 AM  Result Value Ref Range   Free T4 1.2 0.8 - 1.8 ng/dL   *Note: Due to a large number of results and/or encounters for the requested time period, some results have not been displayed. A complete set of results can be found in Results Review.      Assessment & Plan:   Problem List Items Addressed This Visit     Obesity (BMI 30-39.9) (Chronic)   Recommend diet and exercise management. Consider Ozempic or Mounjaro to aid in weight management along with diabetes control and heart disease risk reduction.       Relevant Medications   dapagliflozin propanediol (FARXIGA) 10 MG TABS tablet   Other Relevant Orders   CBC with Differential/Platelet   Comprehensive metabolic panel with GFR   Lipid panel   Hemoglobin A1c   Chronic atrial fibrillation (HCC)   Irregular rhythm today noted. He is currently on metoprolol for rate control and eliquis for anticoagulation. He expresses concern with the high cost of eliqius. We discussed the option to evaluate for patient  assistance. Referral sent to pharmacy. Samples of eliquis provided.       Relevant Medications   dapagliflozin propanediol (FARXIGA) 10 MG TABS tablet   Other Relevant Orders   CBC with Differential/Platelet   Iron, TIBC and Ferritin Panel   Hypertension associated with chronic kidney disease due to type 2 diabetes mellitus (HCC)   BP stable today with no alarm symptoms. Labs pending.       Relevant Medications   dapagliflozin propanediol (FARXIGA) 10 MG TABS tablet   Other Relevant Orders   CBC with Differential/Platelet   Comprehensive metabolic panel with GFR   Lipid panel   Hemoglobin A1c   Type 2 diabetes mellitus with hyperglycemia, without long-term current use of insulin (HCC)   A1c goal less than 7. Upcoming surgery may need to be postponed if he is not below that. Has not been taking Jardiance due to cost. Will start on samples of [place] today and work with pharmacy to see if we can get patient assistance for this. Will monitor A1c today. Consider Ozempic or Mounjaro if we can get this as an affordable option.       Relevant Medications   dapagliflozin propanediol (FARXIGA) 10 MG  TABS tablet   Other Relevant Orders   CBC with Differential/Platelet   Comprehensive metabolic panel with GFR   Iron, TIBC and Ferritin Panel   Lipid panel   Hemoglobin A1c   Microalbumin/Creatinine Ratio, Urine   AMB Referral [facility] Care Management   Encounter for annual physical exam - Primary   CPE completed today. Review of HM activities and recommendations discussed and provided on AVS. Anticipatory guidance, diet, and exercise recommendations provided. Medications, allergies, and hx reviewed and updated as necessary. Orders placed as listed below.  Plan: - Labs ordered. Will make changes as necessary based on results.  - I will review these results and send recommendations via MyChart or a telephone call.  - F/U with CPE in 1 year or sooner for acute/chronic health needs as directed.         COPD (chronic obstructive pulmonary disease) (HCC)   Lungs clear today with no alarm symptoms. Continue to monitor.       Relevant Medications   nicotine (NICODERM CQ - DOSED IN MG/24 HOURS) 21 mg/24hr patch   nicotine (NICODERM CQ - DOSED IN MG/24 HOURS) 14 mg/24hr patch   nicotine (NICODERM CQ - DOSED IN MG/24 HR) 7 mg/24hr patch   Other Relevant Orders   CBC with Differential/Platelet   Comprehensive metabolic panel with GFR   Lipid panel   Hemoglobin A1c   Chronic diastolic CHF (congestive heart failure) (HCC)   No alarm symptoms present. Taking medication as prescribed. No LE edema or crackles noted today.       Relevant Medications   dapagliflozin propanediol (FARXIGA) 10 MG TABS tablet   Other Relevant Orders   CBC with Differential/Platelet   Comprehensive metabolic panel with GFR   Iron, TIBC and Ferritin Panel   Lipid panel   Hemoglobin A1c   Coronary artery disease of native artery of native heart with stable angina pectoris (HCC)   Continue with atorvastatin and working on healthy diet options low in fat. No reported angina. Continue to monitor.       Relevant Medications   dapagliflozin propanediol (FARXIGA) 10 MG TABS tablet   Other Relevant Orders   CBC with Differential/Platelet   Comprehensive metabolic panel with GFR   Lipid panel   Hemoglobin A1c   Anemia   Repeat labs. No alarm symptoms.       Relevant Orders   Iron, TIBC and Ferritin Panel   Vitamin B12   Current moderate episode of major depressive disorder without prior episode (HCC)   Mood well controlled at this time on wellbutrin. No alarm symptom s      Hypercoagulable state (HCC)   Currently managed with Eliqius. No signs of bleeding.       Relevant Medications   dapagliflozin propanediol (FARXIGA) 10 MG TABS tablet   Other Relevant Orders   CBC with Differential/Platelet   Comprehensive metabolic panel with GFR   Iron, TIBC and Ferritin Panel   Vitamin D deficiency   Repeat  labs      Relevant Orders   VITAMIN D 25 Hydroxy (Vit-D Deficiency, Fractures)   Smoking trying to quit   Smokes half to three-quarters of a pack of cigarettes per day and desires to quit. Previous attempts to quit cold [place] were unsuccessful. Concerned about interactions with over-the-counter cessation aids. Nicotine patch therapy is recommended to taper nicotine intake while finding alternative activities to replace smoking habits. - Initiate nicotine patch therapy starting with 21 mg for the first two weeks, then taper  to 14 mg for weeks three and four, and 7 mg for weeks five and six. - Provide a step-by-step guide for smoking cessation. - Encourage finding alternative activities to replace smoking habits, such as crossword puzzles. - Discuss the potential use of Chantix if needed      Relevant Medications   nicotine (NICODERM CQ - DOSED IN MG/24 HOURS) 21 mg/24hr patch   nicotine (NICODERM CQ - DOSED IN MG/24 HOURS) 14 mg/24hr patch   nicotine (NICODERM CQ - DOSED IN MG/24 HR) 7 mg/24hr patch   Other Visit Diagnoses       Need for vaccination against Streptococcus pneumoniae       Relevant Orders   Pneumococcal conjugate vaccine 20-valent (Prevnar 20) (Completed)        Follow up plan: NEXT PREVENTATIVE PHYSICAL DUE IN 1 YEAR. No follow-ups on file.  LABORATORY TESTING:  Health maintenance labs ordered today, if applicable.    PATIENT COUNSELING:   For all adult patients, I recommend A well balanced diet low in saturated fats, cholesterol, and moderation in carbohydrates.  This can be as simple as monitoring portion sizes and cutting back on sugary beverages such as soda and juice to start with.    Daily water consumption of at least 64 ounces.  Physical activity at least 180 minutes per week, if just starting out.  This can be as simple as taking the stairs instead of the elevator and walking 2-3 laps around the office  purposefully every day.   STD protection,  partner selection, and regular testing if high risk.  Limited consumption of alcoholic beverages if alcohol is consumed. For men, I recommend no more than 14 alcoholic beverages per week, spread out throughout the week (max 2 per day). Avoid "binge" drinking or consuming large quantities of alcohol in one setting.  Please let me know if you feel you may need help with reduction or quitting alcohol consumption.   Avoidance of nicotine, if used. Please let me know if you feel you may need help with reduction or quitting nicotine use.   Daily mental health attention. This can be in the form of 5 minute daily meditation, prayer, journaling, yoga, reflection, etc.  Purposeful attention to your emotions and mental state can significantly improve your overall wellbeing and Health.  Please know that I am here to help you with all of your health care goals and am happy to work with you to find a solution that works best for you.  The greatest advice I have received with any changes in life are to take it one step at a time, that even means if all you can focus on is the next 60 seconds, then do that and celebrate your victories.  With any changes in life, you will have set backs, and that is OK. The important thing to remember is, if you have a set back, it is not a failure, it is an opportunity to try again!  Health Maintenance Recommendations Screening Testing Mammogram Every 1 -2 years based on history and risk factors Starting at age 54 Pap Smear Ages 21-39 every 3 years Ages 45-65 every 5 years with HPV testing More frequent testing may be required based on results and history Colon Cancer Screening Every 1-10 years based on test performed, risk factors, and history Starting at age 48 Bone Density Screening Every 2-10 years based on history Starting at age 79 for women Recommendations for men differ based on medication usage, history, and risk factors  AAA Screening One time  ultrasound Men 64-30 years old who have every smoked Lung Cancer Screening Low Dose Lung CT every 12 months Age 13-80 years with a 30 pack-year smoking history who still smoke or who have quit within the last 15 years   Screening Labs Routine  Labs: Complete Blood Count (CBC), Complete Metabolic Panel (CMP), Cholesterol (Lipid Panel) Every 6-12 months based on history and medications May be recommended more frequently based on current conditions or previous results Hemoglobin A1c Lab Every 3-12 months based on history and previous results Starting at age 56 or earlier with diagnosis of diabetes, high cholesterol, BMI >26, and/or risk factors Frequent monitoring for patients with diabetes to ensure blood sugar control Thyroid Panel (TSH) Every 6 months based on history, symptoms, and risk factors May be repeated more often if on medication HIV One time testing for all patients 77 and older May be repeated more frequently for patients with increased risk factors or exposure Hepatitis C One time testing for all patients 79 and older May be repeated more frequently for patients with increased risk factors or exposure Gonorrhea, Chlamydia Every 12 months for all sexually active persons 13-24 years Additional monitoring may be recommended for those who are considered high risk or who have symptoms Every 12 months for any woman on birth control, regardless of sexual activity PSA Men 38-38 years old with risk factors Additional screening may be recommended from age 38-69 based on risk factors, symptoms, and history  Vaccine Recommendations Tetanus Booster All adults every 10 years Flu Vaccine All patients 6 months and older every year COVID Vaccine All patients 12 years and older Initial dosing with booster May recommend additional booster based on age and health history HPV Vaccine 2 doses all patients age 43-26 Dosing may be considered for patients over 26 Shingles Vaccine  (Shingrix) 2 doses all adults 55 years and older Pneumonia (Pneumovax 23) All adults 65 years and older May recommend earlier dosing based on health history One year apart from Prevnar 13 Pneumonia (Prevnar 14) All adults 65 years and older Dosed 1 year after Pneumovax 23 Pneumonia (Prevnar 20) One time alternative to the two dosing of 13 and 23 For all adults with initial dose of 23, 20 is recommended 1 year later For all adults with initial dose of 13, 23 is still recommended as second option 1 year later  Additional Screening, Testing, and Vaccinations may be recommended on an individualized basis based on family history, health history, risk factors, and/or exposure.

## 2023-12-05 NOTE — Assessment & Plan Note (Signed)
 Irregular rhythm today noted. He is currently on metoprolol for rate control and eliquis for anticoagulation. He expresses concern with the high cost of eliqius. We discussed the option to evaluate for patient assistance. Referral sent to pharmacy. Samples of eliquis provided.

## 2023-12-05 NOTE — Assessment & Plan Note (Signed)
 Currently managed with Eliqius. No signs of bleeding.

## 2023-12-05 NOTE — Assessment & Plan Note (Signed)
 Mood well controlled at this time on wellbutrin. No alarm symptom s

## 2023-12-05 NOTE — Assessment & Plan Note (Signed)
 Lungs clear today with no alarm symptoms. Continue to monitor.

## 2023-12-05 NOTE — Assessment & Plan Note (Signed)
 No alarm symptoms present. Taking medication as prescribed. No LE edema or crackles noted today.

## 2023-12-05 NOTE — Assessment & Plan Note (Signed)
 Repeat labs:

## 2023-12-05 NOTE — Assessment & Plan Note (Signed)
 CPE completed today. Review of HM activities and recommendations discussed and provided on AVS. Anticipatory guidance, diet, and exercise recommendations provided. Medications, allergies, and hx reviewed and updated as necessary. Orders placed as listed below.  Plan: - Labs ordered. Will make changes as necessary based on results.  - I will review these results and send recommendations via MyChart or a telephone call.  - F/U with CPE in 1 year or sooner for acute/chronic health needs as directed.

## 2023-12-05 NOTE — Assessment & Plan Note (Signed)
 A1c goal less than 7. Upcoming surgery may need to be postponed if he is not below that. Has not been taking Jardiance due to cost. Will start on samples of [place] today and work with pharmacy to see if we can get patient assistance for this. Will monitor A1c today. Consider Ozempic or Mounjaro if we can get this as an affordable option.

## 2023-12-05 NOTE — Assessment & Plan Note (Signed)
 BP stable today with no alarm symptoms. Labs pending.

## 2023-12-05 NOTE — Assessment & Plan Note (Signed)
 Recommend diet and exercise management. Consider Ozempic or Mounjaro to aid in weight management along with diabetes control and heart disease risk reduction.

## 2023-12-05 NOTE — Assessment & Plan Note (Signed)
 Continue with atorvastatin and working on healthy diet options low in fat. No reported angina. Continue to monitor.

## 2023-12-06 ENCOUNTER — Other Ambulatory Visit (HOSPITAL_BASED_OUTPATIENT_CLINIC_OR_DEPARTMENT_OTHER): Payer: Self-pay

## 2023-12-06 ENCOUNTER — Other Ambulatory Visit: Payer: Self-pay

## 2023-12-06 NOTE — Telephone Encounter (Signed)
   Patient Name: Shaun [name]  DOB: [birth year] MRN: [id number]  Primary Cardiologist: [name], MD  Chart reviewed as part of pre-operative protocol coverage. Given past medical history and time since last visit, based on ACC/AHA guidelines, LIVIO LEDWITH is at acceptable risk for the planned procedure without further cardiovascular testing.   Patient cleared for surgery from a cardiac perspective during office visit with Azalee Course, PA on 09/29/2023.  Per office protocol, patient can hold Eliquis for 3 days prior to procedure.   Patient will not need bridging with Lovenox (enoxaparin) around procedure.  I will route this recommendation to the requesting party via Epic fax function and remove from pre-op pool.  Please call with questions.  Denyce Robert, NP 12/06/2023, 9:14 AM

## 2023-12-06 NOTE — Telephone Encounter (Signed)
 Patient with diagnosis of atrial fibrillation on Eliquis for anticoagulation.    Procedure:   RIGHT TOTAL HIP ARTHROPLASTY   Date of Surgery:  Clearance TBD   CHA2DS2-VASc Score = 4   This indicates a 4.8% annual risk of stroke. The patient's score is based upon: CHF History: 0 HTN History: 1 Diabetes History: 1 Stroke History: 0 Vascular Disease History: 1 Age Score: 1 Gender Score: 0    CrCl 107 Platelet count 190  Per office protocol, patient can hold Eliquis for 3 days prior to procedure.   Patient will not need bridging with Lovenox (enoxaparin) around procedure.  **This guidance is not considered finalized until pre-operative APP has relayed final recommendations.**

## 2023-12-07 ENCOUNTER — Other Ambulatory Visit

## 2023-12-07 ENCOUNTER — Other Ambulatory Visit (HOSPITAL_BASED_OUTPATIENT_CLINIC_OR_DEPARTMENT_OTHER): Payer: Self-pay

## 2023-12-07 ENCOUNTER — Telehealth: Payer: Self-pay | Admitting: *Deleted

## 2023-12-07 ENCOUNTER — Other Ambulatory Visit: Payer: Self-pay

## 2023-12-07 LAB — LIPID PANEL

## 2023-12-07 NOTE — Progress Notes (Unsigned)
 Care Guide Pharmacy Note  12/07/2023 Name: Shaun [name] MRN: [id number] DOB: 1956-07-05  Referred By: [name], NP Reason for referral: Complex Care Management (Initial outreach to schedule referral with PharmD )   Shaun [name] is a 68 y.o. year old male who is a primary care patient of Early, Sung Amabile, NP.  Shaun [name] was referred to the pharmacist for assistance related to: DMII  An unsuccessful telephone outreach was attempted today to contact the patient who was referred to the pharmacy team for assistance with medication assistance. Additional attempts will be made to contact the patient.Eliquis and Jardiance (or alternative).   Gwenevere Ghazi  [facility]  [facility], [facility] Guide  Direct Dial: [phone]  Fax [phone]

## 2023-12-08 ENCOUNTER — Other Ambulatory Visit (HOSPITAL_BASED_OUTPATIENT_CLINIC_OR_DEPARTMENT_OTHER): Payer: Self-pay

## 2023-12-08 ENCOUNTER — Other Ambulatory Visit: Payer: Self-pay | Admitting: Nurse Practitioner

## 2023-12-08 ENCOUNTER — Encounter: Payer: Self-pay | Admitting: Nurse Practitioner

## 2023-12-08 DIAGNOSIS — E559 Vitamin D deficiency, unspecified: Secondary | ICD-10-CM

## 2023-12-08 LAB — CBC WITH DIFFERENTIAL/PLATELET
Basophils Absolute: 0 10*3/uL (ref 0.0–0.2)
Basos: 1 %
EOS (ABSOLUTE): 0.1 10*3/uL (ref 0.0–0.4)
Eos: 2 %
Hematocrit: 43.9 % (ref 37.5–51.0)
Hemoglobin: 14.6 g/dL (ref 13.0–17.7)
Immature Grans (Abs): 0 10*3/uL (ref 0.0–0.1)
Immature Granulocytes: 0 %
Lymphocytes Absolute: 1.4 10*3/uL (ref 0.7–3.1)
Lymphs: 25 %
MCH: 28.4 pg (ref 26.6–33.0)
MCHC: 33.3 g/dL (ref 31.5–35.7)
MCV: 85 fL (ref 79–97)
Monocytes Absolute: 0.4 10*3/uL (ref 0.1–0.9)
Monocytes: 6 %
Neutrophils Absolute: 3.7 10*3/uL (ref 1.4–7.0)
Neutrophils: 66 %
Platelets: 154 10*3/uL (ref 150–450)
RBC: 5.14 x10E6/uL (ref 4.14–5.80)
RDW: 13.6 % (ref 11.6–15.4)
WBC: 5.6 10*3/uL (ref 3.4–10.8)

## 2023-12-08 LAB — COMPREHENSIVE METABOLIC PANEL WITH GFR
ALT: 24 IU/L (ref 0–44)
AST: 19 IU/L (ref 0–40)
Albumin: 4.3 g/dL (ref 3.9–4.9)
Alkaline Phosphatase: 85 IU/L (ref 44–121)
BUN/Creatinine Ratio: 15 (ref 10–24)
BUN: 13 mg/dL (ref 8–27)
Bilirubin Total: 0.5 mg/dL (ref 0.0–1.2)
CO2: 18 mmol/L — ABNORMAL LOW (ref 20–29)
Calcium: 9.1 mg/dL (ref 8.6–10.2)
Chloride: 101 mmol/L (ref 96–106)
Creatinine, Ser: 0.85 mg/dL (ref 0.76–1.27)
Globulin, Total: 2 g/dL (ref 1.5–4.5)
Glucose: 170 mg/dL — ABNORMAL HIGH (ref 70–99)
Potassium: 4.6 mmol/L (ref 3.5–5.2)
Sodium: 135 mmol/L (ref 134–144)
Total Protein: 6.3 g/dL (ref 6.0–8.5)
eGFR: 95 mL/min/{1.73_m2} (ref >59)

## 2023-12-08 LAB — IRON,TIBC AND FERRITIN PANEL
Ferritin: 81 ng/mL (ref 30–400)
Iron Saturation: 20 % (ref 15–55)
Iron: 73 ug/dL (ref 38–169)
Total Iron Binding Capacity: 372 ug/dL (ref 250–450)
UIBC: 299 ug/dL (ref 111–343)

## 2023-12-08 LAB — LIPID PANEL
Cholesterol, Total: 131 mg/dL (ref 100–199)
HDL: 30 mg/dL — ABNORMAL LOW (ref >39)
LDL CALC COMMENT:: 4.4 ratio (ref 0.0–5.0)
LDL Chol Calc (NIH): 66 mg/dL (ref 0–99)
Triglycerides: 213 mg/dL — ABNORMAL HIGH (ref 0–149)
VLDL Cholesterol Cal: 35 mg/dL (ref 5–40)

## 2023-12-08 LAB — HEMOGLOBIN A1C
Est. average glucose Bld gHb Est-mCnc: 194 mg/dL
Hgb A1c MFr Bld: 8.4 % — ABNORMAL HIGH (ref 4.8–5.6)

## 2023-12-08 LAB — VITAMIN B12: Vitamin B-12: 541 pg/mL (ref 232–1245)

## 2023-12-08 LAB — MICROALBUMIN / CREATININE URINE RATIO
Creatinine, Urine: 136.1 mg/dL
Microalb/Creat Ratio: 7 mg/g{creat} (ref 0–29)
Microalbumin, Urine: 9.3 ug/mL

## 2023-12-08 LAB — VITAMIN D 25 HYDROXY (VIT D DEFICIENCY, FRACTURES): Vit D, 25-Hydroxy: 28.3 ng/mL — ABNORMAL LOW (ref 30.0–100.0)

## 2023-12-08 MED ORDER — VITAMIN D (ERGOCALCIFEROL) 1.25 MG (50000 UNIT) PO CAPS
50000.0000 [IU] | ORAL_CAPSULE | ORAL | 3 refills | Status: AC
  Filled 2023-12-08: qty 12, 84d supply, fill #0
  Filled 2024-02-24: qty 12, 84d supply, fill #1
  Filled 2024-05-20: qty 12, 84d supply, fill #2
  Filled 2024-08-12: qty 12, 84d supply, fill #3

## 2023-12-08 NOTE — Progress Notes (Signed)
 Care Guide Pharmacy Note  12/08/2023 Name: Shaun [name] MRN: [id number] DOB: Jul 14, 1956  Referred By: [name], NP Reason for referral: Complex Care Management (Initial outreach to schedule referral with PharmD )   Shaun [name] is a 67 y.o. year old male who is a primary care patient of Early, Sung Amabile, NP.  Shaun [name] was referred to the pharmacist for assistance related to: DMII  Successful contact was made with the patient to discuss pharmacy services including being ready for the pharmacist to call at least 5 minutes before the scheduled appointment time and to have medication bottles and any blood pressure readings ready for review. The patient agreed to meet with the pharmacist via telephone visit on (date/time). 4/17 at 3:00 PM  Gwenevere Ghazi  [facility], [facility] Guide  Direct Dial: [phone]  Fax [phone]

## 2023-12-09 ENCOUNTER — Other Ambulatory Visit (HOSPITAL_BASED_OUTPATIENT_CLINIC_OR_DEPARTMENT_OTHER): Payer: Self-pay

## 2023-12-11 ENCOUNTER — Encounter: Payer: Self-pay | Admitting: Nurse Practitioner

## 2023-12-12 NOTE — Patient Instructions (Signed)
 SURGICAL WAITING ROOM VISITATION  Patients having surgery or a procedure may have no more than 2 support people in the waiting area - these visitors may rotate.    Children under the age of 74 must have an adult with them who is not the patient.  Due to an increase in RSV and influenza rates and associated hospitalizations, children ages 43 and under may not visit patients in [facility] hospitals.  Visitors with respiratory illnesses are discouraged from visiting and should remain at home.  If the patient needs to stay at the hospital during part of their recovery, the visitor guidelines for inpatient rooms apply. Pre-op nurse will coordinate an appropriate time for 1 support person to accompany patient in pre-op.  This support person may not rotate.    Please refer to the [facility] website for the visitor guidelines for Inpatients (after your surgery is over and you are in a regular room).       Your procedure is scheduled on:  12/22/23    Report to [facility] Main Entrance    Report to admitting at  0730 AM   Call this number if you have problems the morning of surgery [phone]   Do not eat food :After Midnight.   After Midnight you may have the following liquids until __ 0700____ AM DAY OF SURGERY  Water Non-Citrus Juices (without pulp, NO RED-Apple, White grape, White cranberry) Black Coffee (NO MILK/CREAM OR CREAMERS, sugar ok)  Clear Tea (NO MILK/CREAM OR CREAMERS, sugar ok) regular and decaf                             Plain Jell-O (NO RED)                                           Fruit ices (not with fruit pulp, NO RED)                                     Popsicles (NO RED)                                                               Sports drinks like Gatorade (NO RED)                   The day of surgery:  Drink ONE (1) Pre-Surgery Clear Ensure or G2 at  0700 AM  ( have completed by ) the morning of surgery. Drink in one sitting. Do not sip.  This  drink was given to you during your hospital  pre-op appointment visit. Nothing else to drink after completing the  Pre-Surgery Clear Ensure or G2.          If you have questions, please contact your surgeon's office.      Oral Hygiene is also important to reduce your risk of infection.  Remember - BRUSH YOUR TEETH THE MORNING OF SURGERY WITH YOUR REGULAR TOOTHPASTE  DENTURES WILL BE REMOVED PRIOR TO SURGERY PLEASE DO NOT APPLY "Poly grip" OR ADHESIVES!!!   Do NOT smoke after Midnight   Stop all vitamins and herbal supplements 7 days before surgery.   Take these medicines the morning of surgery with A SIP OF WATER:  nebuliizer if needed, inhalers as usual and bring, wellbutrin, lexApro, metoprolol, prtonix               Farxiga- Hold for 72 hours prior to surgery Last dose on 12/18/23              Metformin- none day of surgery  DO NOT TAKE ANY ORAL DIABETIC MEDICATIONS DAY OF YOUR SURGERY  Bring CPAP mask and tubing day of surgery.                              You may not have any metal on your body including hair pins, jewelry, and body piercing             Do not wear make-up, lotions, powders, perfumes/cologne, or deodorant  Do not wear nail polish including gel and S&S, artificial/acrylic nails, or any other type of covering on natural nails including finger and toenails. If you have artificial nails, gel coating, etc. that needs to be removed by a nail salon please have this removed prior to surgery or surgery may need to be canceled/ delayed if the surgeon/ anesthesia feels like they are unable to be safely monitored.   Do not shave  48 hours prior to surgery.               Men may shave face and neck.   Do not bring valuables to the hospital. [facility] IS NOT             RESPONSIBLE   FOR VALUABLES.   Contacts, glasses, dentures or bridgework may not be worn into surgery.   Bring small overnight bag day of surgery.   DO NOT BRING  YOUR HOME MEDICATIONS TO THE HOSPITAL. PHARMACY WILL DISPENSE MEDICATIONS LISTED ON YOUR MEDICATION LIST TO YOU DURING YOUR ADMISSION IN THE HOSPITAL!    Patients discharged on the day of surgery will not be allowed to drive home.  Someone NEEDS to stay with you for the first 24 hours after anesthesia.   Special Instructions: Bring a copy of your healthcare power of attorney and living will documents the day of surgery if you haven't scanned them before.              Please read over the following fact sheets you were given: IF YOU HAVE QUESTIONS ABOUT YOUR PRE-OP INSTRUCTIONS PLEASE CALL [phone]   If you received a COVID test during your pre-op visit  it is requested that you wear a mask when out in public, stay away from anyone that may not be feeling well and notify your surgeon if you develop symptoms. If you test positive for Covid or have been in contact with anyone that has tested positive in the last 10 days please notify you surgeon.      Pre-operative 5 CHG Bath Instructions   You can play a key role in reducing the risk of infection after surgery. Your skin needs to be as free of germs as possible. You can reduce the number of germs on your skin by washing with CHG (  chlorhexidine gluconate) soap before surgery. CHG is an antiseptic soap that kills germs and continues to kill germs even after washing.   DO NOT use if you have an allergy to chlorhexidine/CHG or antibacterial soaps. If your skin becomes reddened or irritated, stop using the CHG and notify one of our RNs at [phone].   Please shower with the CHG soap starting 4 days before surgery using the following schedule:     Please keep in mind the following:  DO NOT shave, including legs and underarms, starting the day of your first shower.   You may shave your face at any point before/day of surgery.  Place clean sheets on your bed the day you start using CHG soap. Use a clean washcloth (not used since being  washed) for each shower. DO NOT sleep with pets once you start using the CHG.   CHG Shower Instructions:  If you choose to wash your hair and private area, wash first with your normal shampoo/soap.  After you use shampoo/soap, rinse your hair and body thoroughly to remove shampoo/soap residue.  Turn the water OFF and apply about 3 tablespoons (45 ml) of CHG soap to a CLEAN washcloth.  Apply CHG soap ONLY FROM YOUR NECK DOWN TO YOUR TOES (washing for 3-5 minutes)  DO NOT use CHG soap on face, private areas, open wounds, or sores.  Pay special attention to the area where your surgery is being performed.  If you are having back surgery, having someone wash your back for you may be helpful. Wait 2 minutes after CHG soap is applied, then you may rinse off the CHG soap.  Pat dry with a clean towel  Put on clean clothes/pajamas   If you choose to wear lotion, please use ONLY the CHG-compatible lotions on the back of this paper.     Additional instructions for the day of surgery: DO NOT APPLY any lotions, deodorants, cologne, or perfumes.   Put on clean/comfortable clothes.  Brush your teeth.  Ask your nurse before applying any prescription medications to the skin.      CHG Compatible Lotions   Aveeno Moisturizing lotion  Cetaphil Moisturizing Cream  Cetaphil Moisturizing Lotion  Clairol Herbal Essence Moisturizing Lotion, Dry Skin  Clairol Herbal Essence Moisturizing Lotion, Extra Dry Skin  Clairol Herbal Essence Moisturizing Lotion, Normal Skin  Curel Age Defying Therapeutic Moisturizing Lotion with Alpha Hydroxy  Curel Extreme Care Body Lotion  Curel Soothing Hands Moisturizing Hand Lotion  Curel Therapeutic Moisturizing Cream, Fragrance-Free  Curel Therapeutic Moisturizing Lotion, Fragrance-Free  Curel Therapeutic Moisturizing Lotion, Original Formula  Eucerin Daily Replenishing Lotion  Eucerin Dry Skin Therapy Plus Alpha Hydroxy Crme  Eucerin Dry Skin Therapy Plus Alpha  Hydroxy Lotion  Eucerin Original Crme  Eucerin Original Lotion  Eucerin Plus Crme Eucerin Plus Lotion  Eucerin TriLipid Replenishing Lotion  Keri Anti-Bacterial Hand Lotion  Keri Deep Conditioning Original Lotion Dry Skin Formula Softly Scented  Keri Deep Conditioning Original Lotion, Fragrance Free Sensitive Skin Formula  Keri Lotion Fast Absorbing Fragrance Free Sensitive Skin Formula  Keri Lotion Fast Absorbing Softly Scented Dry Skin Formula  Keri Original Lotion  Keri Skin Renewal Lotion Keri Silky Smooth Lotion  Keri Silky Smooth Sensitive Skin Lotion  Nivea Body Creamy Conditioning Oil  Nivea Body Extra Enriched Teacher, adult education Moisturizing Lotion Nivea Crme  Nivea Skin Firming Lotion  NutraDerm 30 Skin Lotion  NutraDerm Skin Lotion  NutraDerm Therapeutic Skin Cream  NutraDerm Therapeutic  Skin Lotion  ProShield Protective Hand Cream  Provon moisturizing lotion

## 2023-12-12 NOTE — Progress Notes (Addendum)
 Anesthesia Review:  PCP: Sherrell Dodrill  Cardiologist : Bubba Carbo Fountain,NP clerance 12/04/23 telephone visit  LOV Hao meng,PA 09/29/23   PPM/ ICD: Device Orders: Rep Notified:  Chest x-ray : 06/17/23- 1 view  CT chest- 08/10/23  EKG : 09/19/23  Echo : 08/14/23  Stress test: 09/05/23  Cardiac Cath :  09/19/23   Activity level: can do a flight of stairs without difficutly  Sleep Study/ CPAP : has cpap  Fasting Blood Sugar :      / Checks Blood Sugar -- times a day:     DM- type2- checks glucose bid  Hgba1c-12/07/23- -8.4- routed to Dr Jannelle Memory on 12/12/23. Routed again on 12/14/23 and redrawn on 12/14/23  Sent message to Cloretta Danes at [facility] office.   Farxiga- has been on hold due to ? Reaction of muscle cramps pt to restart and try again on 12/18/23- last dose on 12/18/23  Metformin- none am of surgery   Blood Thinner/ Instructions /Last Dose: ASA / Instructions/ Last Dose :  Eliquis- last dose on 12/18/23     12/07/23- cbc and cmp done

## 2023-12-14 ENCOUNTER — Encounter (HOSPITAL_COMMUNITY)
Admission: RE | Admit: 2023-12-14 | Discharge: 2023-12-14 | Disposition: A | Discharge status: Home / Self Care | Source: Ambulatory Visit | Attending: Orthopaedic Surgery | Admitting: Orthopaedic Surgery

## 2023-12-14 ENCOUNTER — Other Ambulatory Visit: Payer: Self-pay

## 2023-12-14 ENCOUNTER — Ambulatory Visit

## 2023-12-14 ENCOUNTER — Encounter (HOSPITAL_COMMUNITY): Payer: Self-pay

## 2023-12-14 VITALS — BP 129/91 | HR 97 | Temp 98.1°F | Resp 16 | Ht 68.0 in | Wt 215.0 lb

## 2023-12-14 DIAGNOSIS — Z7901 Long term (current) use of anticoagulants: Secondary | ICD-10-CM | POA: Diagnosis not present

## 2023-12-14 DIAGNOSIS — Z01818 Encounter for other preprocedural examination: Secondary | ICD-10-CM

## 2023-12-14 DIAGNOSIS — E1165 Type 2 diabetes mellitus with hyperglycemia: Secondary | ICD-10-CM

## 2023-12-14 DIAGNOSIS — I4891 Unspecified atrial fibrillation: Secondary | ICD-10-CM | POA: Insufficient documentation

## 2023-12-14 DIAGNOSIS — Z01812 Encounter for preprocedural laboratory examination: Secondary | ICD-10-CM | POA: Diagnosis present

## 2023-12-14 HISTORY — DX: Other complications of anesthesia, initial encounter: T88.59XA

## 2023-12-14 HISTORY — DX: Nausea with vomiting, unspecified: R11.2

## 2023-12-14 HISTORY — DX: Other specified postprocedural states: Z98.890

## 2023-12-14 LAB — SURGICAL PCR SCREEN
MRSA, PCR: NEGATIVE
Staphylococcus aureus: NEGATIVE

## 2023-12-14 LAB — GLUCOSE, CAPILLARY: Glucose-Capillary: 150 mg/dL — ABNORMAL HIGH (ref 70–99)

## 2023-12-15 LAB — HEMOGLOBIN A1C
Hgb A1c MFr Bld: 8.2 % — ABNORMAL HIGH (ref 4.8–5.6)
Mean Plasma Glucose: 189 mg/dL

## 2023-12-15 NOTE — Progress Notes (Signed)
 12/15/2023 Name: Shaun [name] MRN: [id number] DOB: 03-Feb-1956  Chief Complaint  Patient presents with   Diabetes   Medication Management    Shaun [name] is a 68 y.o. year old male who presented for a telephone visit.   They were referred to the pharmacist by their PCP for assistance in managing diabetes and medication access.    Subjective:  Care Team: Primary Care Provider: Annella Kief, NP ; Next Scheduled Visit: 12/24/24  Medication Access/Adherence  Current Pharmacy:  [facility] [address] [place] [address] Phone: [phone] Fax: [phone]   Patient reports affordability concerns with their medications: Yes  - Eliquis , Jardiance  Patient reports access/transportation concerns to their pharmacy: No  Patient reports adherence concerns with their medications:  No     Diabetes:  Current medications: Farxiga  10mg  (holding due to belly cramping), Metformin  500mg  BID Medications tried in the past: Glipizide   Current glucose readings: Not discussed during this visit, focus on getting access to needed medication  Observed patterns:  Patient denies hypoglycemic s/sx including dizziness, shakiness, sweating. Patient denies hyperglycemic symptoms including polyuria, polydipsia, polyphagia, nocturia, neuropathy, blurred vision.  Current medication access support: None   Objective:  Lab Results  Component Value Date   HGBA1C 8.2 (H) 12/14/2023    Lab Results  Component Value Date   CREATININE 0.85 12/07/2023   BUN 13 12/07/2023   NA 135 12/07/2023   K 4.6 12/07/2023   CL 101 12/07/2023   CO2 18 (L) 12/07/2023    Lab Results  Component Value Date   CHOL 131 12/07/2023   HDL 30 (L) 12/07/2023   LDLCALC 66 12/07/2023   LDLDIRECT 83.0 04/19/2022   TRIG 213 (H) 12/07/2023   CHOLHDL 4.4 12/07/2023    Medications Reviewed Today     Reviewed by Carnell Christian, RPH (Pharmacist) on  12/15/23 at 1041  Med List Status: <None>   Medication Order Taking? Sig Documenting Provider Last Dose Status Informant  albuterol  (PROVENTIL ) (2.5 MG/3ML) 0.083% nebulizer solution [id number] No Take 3 mLs (2.5 mg total) by nebulization every 6 (six) hours as needed for wheezing or shortness of breath. Scarlette Currier, MD Taking Active Self           Med Note Timm Foot, BROOKE C   Tue Dec 12, 2023 10:12 AM)    albuterol  (VENTOLIN  HFA) 108 [phone] Base) MCG/ACT inhaler [id number] No INHALE 2 PUFFS INTO THE LUNGS EVERY 6 HOURS AS NEEDED FOR WHEEZING OR SHORTNESS OF BREATH Tysinger, Christiane Cowing, PA-C Taking Active Self           Med Note Timm Foot, BROOKE C   Tue Dec 12, 2023 10:12 AM)    apixaban  (ELIQUIS ) 5 MG TABS tablet [id number] No Take 1 tablet (5 mg total) by mouth 2 (two) times daily. Lenise Quince, MD Taking Active Self  ascorbic acid (VITAMIN C) 500 MG tablet [id number] No Take 500 mg by mouth daily. [provider] Taking Active Self  atorvastatin  (LIPITOR ) 80 MG tablet [id number] No Take 1 tablet (80 mg total) by mouth every evening. Lenise Quince, MD Taking Active Self           Med Note Carylon Claude, Kraig [place] Sep 15, 2023 12:20 PM) Confirmed per patient got 6 months supply in July  buPROPion  (WELLBUTRIN  XL) 150 MG 24 hr tablet [id number] No Take 3 tablets (450 mg total) by mouth daily. Early, Sara E, NP Taking Active Self  dapagliflozin  propanediol (  FARXIGA ) 10 MG TABS tablet [id number]  Take 1 tablet (10 mg total) by mouth daily before breakfast. Early, Adriane Albe, NP  Active Self           Med Note Timm Foot, BROOKE C   Tue Dec 12, 2023 10:18 AM) ON HOLD  docusate sodium  (COLACE) 100 MG capsule [id number] No Take 1 capsule (100 mg total) by mouth daily as needed for mild constipation. Early, Sara E, NP Taking Active Self  doxycycline  (VIBRA -TABS) 100 MG tablet [id number] No Take 1 tablet (100 mg total) by mouth 2 (two) times daily.  Patient not taking: Reported on 12/05/2023   Rondall Codding, FNP Not Taking Active Self  escitalopram  (LEXAPRO ) 20 MG tablet [id number] No Take 20 mg by mouth in the morning. [provider] Taking Active Self  ezetimibe  (ZETIA ) 10 MG tablet [id number] No Take 1 tablet (10 mg total) by mouth daily. Lenise Quince, MD Taking Active Self  ferrous sulfate  (FEROSUL) 325 (65 FE) MG tablet [id number] No Take 1 tablet (325 mg total) by mouth 3 (three) times a week. Annella Kief, NP Taking Active Self  furosemide  (LASIX ) 40 MG tablet [id number] No Take 1 tablet (40mg ) daily. May take 1/2 tablet (20mg ) additional if shortness of breath or swelling occurs. Annella Kief, NP Taking Active Self  HYDROcodone -acetaminophen  (NORCO/VICODIN) 5-325 MG tablet [id number] No Take 1-2 tablets by mouth 2 (two) times daily as needed for moderate pain (pain score 4-6) or severe pain (pain score 7-10).  Patient taking differently: Take 1-2 tablets by mouth in the morning and at bedtime.   Annella Kief, NP Taking Active Self           Med Note Cathy Cobbs, SYBIL W   Tue Nov 07, 2023  9:42 AM) Not [facility] Rx  hydrOXYzine  (ATARAX ) 25 MG tablet [id number] No Take 1 tablet (25 mg total) by mouth at bedtime and may repeat dose one time if needed for sleep. Early, Sara E, NP Taking Active Self  lidocaine  (XYLOCAINE ) 1 % (with pres) injection 5 mL [id number]   Genetta Kenning, MD  Active   metFORMIN  (GLUCOPHAGE ) 500 MG tablet [id number] No Take 1 tablet (500 mg total) by mouth 2 (two) times daily before a meal. Early, Adriane Albe, NP Taking Active Self  metoprolol  tartrate (LOPRESSOR ) 100 MG tablet [id number] No Take 1 tablet (100 mg total) by mouth 2 (two) times daily. Ervin Heath, [place] Taking Active Self  nicotine  (NICODERM CQ  - DOSED IN MG/24 HOURS) 14 mg/24hr patch [id number]  For Weeks 3-4. Place patch onto skin and wear for 24 hours. If you have sleep disturbances, remove at bedtime. Early, Sara E, NP  Active Self           Med Note Timm Foot, Michae Aden   Tue Dec 12, 2023 10:19 AM)  Has not started yet  nicotine  (NICODERM CQ  - DOSED IN MG/24 HOURS) 21 mg/24hr patch [id number]  For Weeks 1-2. Place patch onto skin and wear for 24 hours. If you have sleep disturbances, remove at bedtime. Early, Sara E, NP  Active Self           Med Note Timm Foot, Michae Aden   Tue Dec 12, 2023 10:19 AM) Has not started yet  nicotine  (NICODERM CQ  - DOSED IN MG/24 HR) 7 mg/24hr patch [id number]  For Weeks 5-8. Place patch onto skin and wear for 24 hours. If you have sleep disturbances, remove at bedtime. Early, Sara E, NP  Active Self  Med Note Timm Foot, BROOKE C   Tue Dec 12, 2023 10:19 AM) Has not started yet  nitroGLYCERIN  (NITROSTAT ) 0.4 MG SL tablet [id number] No Place 1 tablet (0.4 mg total) under the tongue every 5 (five) minutes as needed. Early, Sara E, NP Taking Active Self           Med Note Timm Foot, Michae Aden   Tue Dec 12, 2023 10:16 AM)    pantoprazole  (PROTONIX ) 40 MG tablet [id number] No Take 1 tablet (40 mg total) by mouth daily before breakfast. Lanney Pitts, PA-C Taking Active Self  polyethylene glycol powder (GLYCOLAX /MIRALAX ) 17 GM/SCOOP powder [id number] No Take one capful (17g) twice daily until soft stool, then continue one capful once daily as needed.  Patient taking differently: Take 17 g by mouth daily as needed for moderate constipation.   Lanney Pitts, PA-C Taking Active Self  potassium chloride  SA (KLOR-CON  M) 20 MEQ tablet [id number] No Take 1 tablet (20 mEq total) by mouth daily. Lenise Quince, MD Taking Active Self  rOPINIRole  (REQUIP ) 3 MG tablet [id number] No Take 1 tablet by mouth nightly for restless leg, and a 1/2 tab during the day to control breakthrough symptoms Early, Adriane Albe, NP Taking Active Self  sildenafil  (VIAGRA ) 100 MG tablet [id number] No Take 1 tablet (100 mg total) by mouth 30 minutes before activity Early, Adriane Albe, NP Taking Active Self  tiZANidine  (ZANAFLEX ) 4 MG tablet [id number] No Take 1 tablet (4 mg total) by mouth 2 (two) times daily as  needed for muscle spasms. Early, Sara E, NP Taking Active Self  traZODone  (DESYREL ) 150 MG tablet [id number] No Take 1 tablet (150 mg total) by mouth at bedtime. Early, Sara E, NP Taking Active Self  Vitamin D , Ergocalciferol , (DRISDOL ) 1.25 MG (50000 UNIT) CAPS capsule [id number]  Take 1 capsule (50,000 Units total) by mouth every 7 (seven) days. Annella Kief, NP  Active Self              Assessment/Plan:   Diabetes: - Currently uncontrolled - Reviewed long term cardiovascular and renal outcomes of uncontrolled blood sugar - Reviewed goal A1c, goal fasting, and goal 2 hour post prandial glucose - Reviewed dietary modifications including low carb diet -Recommend to resume Jardiance , providing samples  - Recommend to check glucose daily - Meets financial criteria for Jardiance  patient assistance program through [facility]. Application submitted, pending response. -Patient reports eliquis  will be affordable with jardiance  assistance.   Follow Up Plan: 2 weeks  Carnell Christian, PharmD Clinical Pharmacist [phone]

## 2023-12-18 NOTE — Progress Notes (Signed)
Lab results: A1C: 8.2

## 2023-12-20 ENCOUNTER — Other Ambulatory Visit: Payer: Self-pay | Admitting: Nurse Practitioner

## 2023-12-20 DIAGNOSIS — M2550 Pain in unspecified joint: Secondary | ICD-10-CM

## 2023-12-21 ENCOUNTER — Other Ambulatory Visit (HOSPITAL_BASED_OUTPATIENT_CLINIC_OR_DEPARTMENT_OTHER): Payer: Self-pay

## 2023-12-21 MED ORDER — HYDROCODONE-ACETAMINOPHEN 5-325 MG PO TABS
1.0000 | ORAL_TABLET | Freq: Two times a day (BID) | ORAL | 0 refills | Status: DC | PRN
  Filled 2023-12-21: qty 120, 30d supply, fill #0

## 2023-12-22 ENCOUNTER — Encounter (HOSPITAL_COMMUNITY): Admission: RE | Payer: Self-pay | Source: Home / Self Care

## 2023-12-22 ENCOUNTER — Other Ambulatory Visit: Payer: Self-pay

## 2023-12-22 ENCOUNTER — Ambulatory Visit (HOSPITAL_COMMUNITY): Admission: RE | Admit: 2023-12-22 | Source: Home / Self Care | Admitting: Orthopaedic Surgery

## 2023-12-22 DIAGNOSIS — M1611 Unilateral primary osteoarthritis, right hip: Secondary | ICD-10-CM

## 2023-12-22 LAB — TYPE AND SCREEN
ABO/RH(D): B POS
Antibody Screen: NEGATIVE

## 2023-12-22 SURGERY — ARTHROPLASTY, HIP, TOTAL, ANTERIOR APPROACH
Anesthesia: Spinal | Site: Hip | Laterality: Right

## 2023-12-23 ENCOUNTER — Other Ambulatory Visit: Payer: Self-pay | Admitting: Nurse Practitioner

## 2023-12-23 DIAGNOSIS — G2581 Restless legs syndrome: Secondary | ICD-10-CM

## 2023-12-25 ENCOUNTER — Other Ambulatory Visit (HOSPITAL_BASED_OUTPATIENT_CLINIC_OR_DEPARTMENT_OTHER): Payer: Self-pay

## 2023-12-25 MED ORDER — ROPINIROLE HCL 3 MG PO TABS
ORAL_TABLET | ORAL | 1 refills | Status: DC
  Filled 2023-12-25: qty 45, 30d supply, fill #0
  Filled 2024-01-22: qty 45, 30d supply, fill #1

## 2023-12-28 ENCOUNTER — Other Ambulatory Visit

## 2023-12-28 DIAGNOSIS — E1165 Type 2 diabetes mellitus with hyperglycemia: Secondary | ICD-10-CM

## 2023-12-28 DIAGNOSIS — E1122 Type 2 diabetes mellitus with diabetic chronic kidney disease: Secondary | ICD-10-CM

## 2023-12-28 NOTE — Progress Notes (Signed)
 12/28/2023 Name: Shaun [name] MRN: [id number] DOB: 03/09/1956  Chief Complaint  Patient presents with   Medication Management   Diabetes   Hypertension    CHADDRICK FORBES is a 68 y.o. year old male who presented for a telephone visit.   They were referred to the pharmacist by their PCP for assistance in managing diabetes and medication access.    Subjective:  Care Team: Primary Care Provider: Annella Kief, NP ; Next Scheduled Visit: 12/24/24  Medication Access/Adherence  Current Pharmacy:  [facility] [address] [place] [address] Phone: [phone] Fax: [phone]   Patient reports affordability concerns with their medications: No Patient reports access/transportation concerns to their pharmacy: No  Patient reports adherence concerns with their medications:  No     Diabetes:  Current medications: Jardiance  10mg , Metformin  500mg  BID Medications tried in the past: Glipizide   Current glucose readings:  Checks once to twice daily, usually post-prandial in the evening Seeing readings in the 140s since restarting Jardiance  (had been in the 170s)  Observed patterns:  Patient denies hypoglycemic s/sx including dizziness, shakiness, sweating. Patient denies hyperglycemic symptoms including polyuria, polydipsia, polyphagia, nocturia, neuropathy, blurred vision.  Current medication access support: Jardiance  through [employer]  Hypertension:  Current medications: None Medications previously tried: Lisinopril , Metoprolol   Patient has a validated, automated, upper arm home BP cuff Current blood pressure readings readings: 124/90 today  Patient denies hypotensive s/sx including dizziness, lightheadedness.  Patient denies hypertensive symptoms including headache, chest pain, shortness of breath   Objective:  Lab Results  Component Value Date   HGBA1C 8.2 (H) 12/14/2023    Lab Results  Component  Value Date   CREATININE 0.85 12/07/2023   BUN 13 12/07/2023   NA 135 12/07/2023   K 4.6 12/07/2023   CL 101 12/07/2023   CO2 18 (L) 12/07/2023    Lab Results  Component Value Date   CHOL 131 12/07/2023   HDL 30 (L) 12/07/2023   LDLCALC 66 12/07/2023   LDLDIRECT 83.0 04/19/2022   TRIG 213 (H) 12/07/2023   CHOLHDL 4.4 12/07/2023    Medications Reviewed Today     Reviewed by Carnell Christian, RPH (Pharmacist) on 12/28/23 at [phone]  Med List Status: <None>   Medication Order Taking? Sig Documenting Provider Last Dose Status Informant  albuterol  (PROVENTIL ) (2.5 MG/3ML) 0.083% nebulizer solution [id number]  Take 3 mLs (2.5 mg total) by nebulization every 6 (six) hours as needed for wheezing or shortness of breath. Scarlette Currier, MD  Active Self           Med Note Timm Foot, BROOKE C   Tue Dec 12, 2023 10:12 AM)    albuterol  (VENTOLIN  HFA) 108 [phone] Base) MCG/ACT inhaler [id number]  INHALE 2 PUFFS INTO THE LUNGS EVERY 6 HOURS AS NEEDED FOR WHEEZING OR SHORTNESS OF BREATH Tysinger, Christiane Cowing, PA-C  Active Self           Med Note Timm Foot, BROOKE C   Tue Dec 12, 2023 10:12 AM)    apixaban  (ELIQUIS ) 5 MG TABS tablet [id number]  Take 1 tablet (5 mg total) by mouth 2 (two) times daily. Lenise Quince, MD  Active Self  ascorbic acid (VITAMIN C) 500 MG tablet [id number]  Take 500 mg by mouth daily. [provider]  Active Self  atorvastatin  (LIPITOR ) 80 MG tablet [id number]  Take 1 tablet (80 mg total) by mouth every evening. Lenise Quince, MD  Active Self  Med Note Carylon Claude, Kraig [place] Sep 15, 2023 12:20 PM) Confirmed per patient got 6 months supply in July  buPROPion  (WELLBUTRIN  XL) 150 MG 24 hr tablet [id number]  Take 3 tablets (450 mg total) by mouth daily. Early, Sara E, NP  Active Self  docusate sodium  (COLACE) 100 MG capsule [id number]  Take 1 capsule (100 mg total) by mouth daily as needed for mild constipation. Early, Sara E, NP  Active Self  doxycycline  (VIBRA -TABS)  100 MG tablet [id number]  Take 1 tablet (100 mg total) by mouth 2 (two) times daily.  Patient not taking: Reported on 12/05/2023   Webb, Padonda B, FNP  Active Self  empagliflozin  (JARDIANCE ) 10 MG TABS tablet [id number] Yes Take 10 mg by mouth daily. Through [employer] PAP [provider]  Active Self  escitalopram  (LEXAPRO ) 20 MG tablet [id number]  Take 20 mg by mouth in the morning. [provider]  Active Self  ezetimibe  (ZETIA ) 10 MG tablet [id number]  Take 1 tablet (10 mg total) by mouth daily. Lenise Quince, MD  Active Self  ferrous sulfate  (FEROSUL) 325 (65 FE) MG tablet [id number]  Take 1 tablet (325 mg total) by mouth 3 (three) times a week. Early, Sara E, NP  Active Self  furosemide  (LASIX ) 40 MG tablet [id number]  Take 1 tablet (40mg ) daily. May take 1/2 tablet (20mg ) additional if shortness of breath or swelling occurs. Early, Sara E, NP  Active Self  HYDROcodone -acetaminophen  (NORCO/VICODIN) 5-325 MG tablet [id number]  Take 1-2 tablets by mouth 2 (two) times daily as needed for moderate pain (pain score 4-6) or severe pain (pain score 7-10). Early, Sara E, NP  Active   hydrOXYzine  (ATARAX ) 25 MG tablet [id number]  Take 1 tablet (25 mg total) by mouth at bedtime and may repeat dose one time if needed for sleep. Early, Sara E, NP  Active Self  lidocaine  (XYLOCAINE ) 1 % (with pres) injection 5 mL [id number]   Genetta Kenning, MD  Active   metFORMIN  (GLUCOPHAGE ) 500 MG tablet [id number]  Take 1 tablet (500 mg total) by mouth 2 (two) times daily before a meal. Early, Adriane Albe, NP  Active Self  metoprolol  tartrate (LOPRESSOR ) 100 MG tablet [id number]  Take 1 tablet (100 mg total) by mouth 2 (two) times daily. Meng, Hao, [place]  Active Self  nicotine  (NICODERM CQ  - DOSED IN MG/24 HOURS) 14 mg/24hr patch [id number]  For Weeks 3-4. Place patch onto skin and wear for 24 hours. If you have sleep disturbances, remove at bedtime. Early, Sara E, NP  Active Self           Med Note Timm Foot, BROOKE C    Tue Dec 12, 2023 10:19 AM) Has not started yet  nicotine  (NICODERM CQ  - DOSED IN MG/24 HOURS) 21 mg/24hr patch [id number]  For Weeks 1-2. Place patch onto skin and wear for 24 hours. If you have sleep disturbances, remove at bedtime. Early, Sara E, NP  Active Self           Med Note Timm Foot, Michae Aden   Tue Dec 12, 2023 10:19 AM) Has not started yet  nicotine  (NICODERM CQ  - DOSED IN MG/24 HR) 7 mg/24hr patch [id number]  For Weeks 5-8. Place patch onto skin and wear for 24 hours. If you have sleep disturbances, remove at bedtime. Early, Sara E, NP  Active Self           Med Note Timm Foot, Michae Aden   Tue Dec 12, 2023 10:19  AM) Has not started yet  nitroGLYCERIN  (NITROSTAT ) 0.4 MG SL tablet [id number]  Place 1 tablet (0.4 mg total) under the tongue every 5 (five) minutes as needed. Early, Sara E, NP  Active Self           Med Note Timm Foot, BROOKE C   Tue Dec 12, 2023 10:16 AM)    pantoprazole  (PROTONIX ) 40 MG tablet [id number]  Take 1 tablet (40 mg total) by mouth daily before breakfast. Lanney Pitts, PA-C  Active Self  polyethylene glycol powder (GLYCOLAX /MIRALAX ) 17 GM/SCOOP powder [id number]  Take one capful (17g) twice daily until soft stool, then continue one capful once daily as needed.  Patient taking differently: Take 17 g by mouth daily as needed for moderate constipation.   Lanney Pitts, PA-C  Active Self  potassium chloride  SA (KLOR-CON  M) 20 MEQ tablet [id number]  Take 1 tablet (20 mEq total) by mouth daily. Lenise Quince, MD  Active Self  rOPINIRole  (REQUIP ) 3 MG tablet [id number]  Take 1 tablet by mouth nightly for restless leg, and a 1/2 tab during the day to control breakthrough symptoms Early, Sara E, NP  Active   sildenafil  (VIAGRA ) 100 MG tablet [id number]  Take 1 tablet (100 mg total) by mouth 30 minutes before activity Early, Adriane Albe, NP  Active Self  tiZANidine  (ZANAFLEX ) 4 MG tablet [id number]  Take 1 tablet (4 mg total) by mouth 2 (two) times daily as needed for muscle spasms.  Early, Sara E, NP  Active Self  traZODone  (DESYREL ) 150 MG tablet [id number]  Take 1 tablet (150 mg total) by mouth at bedtime. Early, Sara E, NP  Active Self  Vitamin D , Ergocalciferol , (DRISDOL ) 1.25 MG (50000 UNIT) CAPS capsule [id number]  Take 1 capsule (50,000 Units total) by mouth every 7 (seven) days. Annella Kief, NP  Active Self              Assessment/Plan:   Diabetes: - Currently uncontrolled - Reviewed long term cardiovascular and renal outcomes of uncontrolled blood sugar - Reviewed goal A1c, goal fasting, and goal 2 hour post prandial glucose - Reviewed dietary modifications including low carb diet -Notified patient he was approved to receive Jardiance  through [employer] PAP -Recommend to continue current medication therapy - Recommend to check glucose daily  Hypertension: - Currently uncontrolled - Reviewed long term cardiovascular and renal outcomes of uncontrolled blood pressure - Reviewed appropriate blood pressure monitoring technique and reviewed goal blood pressure. Recommended to check home blood pressure and heart rate daily - Recommend to limit sodium/caffeine  -Consider re-addition of lisinopril  if BP remains elevated         Follow Up Plan: 1 month  Carnell Christian, PharmD Clinical Pharmacist [phone]

## 2024-01-04 ENCOUNTER — Encounter: Admitting: Orthopaedic Surgery

## 2024-01-12 ENCOUNTER — Other Ambulatory Visit (HOSPITAL_BASED_OUTPATIENT_CLINIC_OR_DEPARTMENT_OTHER): Payer: Self-pay

## 2024-01-12 ENCOUNTER — Other Ambulatory Visit: Payer: Self-pay

## 2024-01-12 ENCOUNTER — Other Ambulatory Visit: Payer: Self-pay | Admitting: Nurse Practitioner

## 2024-01-12 DIAGNOSIS — N471 Phimosis: Secondary | ICD-10-CM

## 2024-01-12 MED ORDER — SILDENAFIL CITRATE 100 MG PO TABS
100.0000 mg | ORAL_TABLET | ORAL | 0 refills | Status: DC
  Filled 2024-01-12: qty 8, 8d supply, fill #0

## 2024-01-22 ENCOUNTER — Other Ambulatory Visit: Payer: Self-pay | Admitting: Nurse Practitioner

## 2024-01-22 DIAGNOSIS — E119 Type 2 diabetes mellitus without complications: Secondary | ICD-10-CM

## 2024-01-23 ENCOUNTER — Other Ambulatory Visit: Payer: Self-pay

## 2024-01-23 ENCOUNTER — Other Ambulatory Visit (HOSPITAL_BASED_OUTPATIENT_CLINIC_OR_DEPARTMENT_OTHER): Payer: Self-pay

## 2024-01-23 MED ORDER — METFORMIN HCL 500 MG PO TABS
500.0000 mg | ORAL_TABLET | Freq: Two times a day (BID) | ORAL | 1 refills | Status: DC
  Filled 2024-02-01: qty 180, 90d supply, fill #0
  Filled 2024-05-16: qty 180, 90d supply, fill #1

## 2024-01-24 ENCOUNTER — Other Ambulatory Visit (HOSPITAL_BASED_OUTPATIENT_CLINIC_OR_DEPARTMENT_OTHER): Payer: Self-pay

## 2024-01-24 ENCOUNTER — Other Ambulatory Visit: Payer: Self-pay

## 2024-01-31 ENCOUNTER — Telehealth: Payer: Self-pay

## 2024-01-31 ENCOUNTER — Other Ambulatory Visit: Payer: Self-pay

## 2024-01-31 DIAGNOSIS — E1165 Type 2 diabetes mellitus with hyperglycemia: Secondary | ICD-10-CM

## 2024-01-31 NOTE — Telephone Encounter (Signed)
 Last apt. Checked A1C 12/14/23 8.2  Copied from CRM #[id number]. Topic: Clinical - Lab/Test Results >> Jan 31, 2024 11:26 AM Lotus Round B wrote: Reason for CRM: pt would like to see if a order can be put in to get his A1C checked . If possible to give him a call with an appt with the lab if order can be put in

## 2024-02-01 ENCOUNTER — Other Ambulatory Visit: Payer: Self-pay | Admitting: Nurse Practitioner

## 2024-02-01 ENCOUNTER — Other Ambulatory Visit

## 2024-02-01 ENCOUNTER — Other Ambulatory Visit (HOSPITAL_BASED_OUTPATIENT_CLINIC_OR_DEPARTMENT_OTHER): Payer: Self-pay

## 2024-02-01 ENCOUNTER — Other Ambulatory Visit: Payer: Self-pay

## 2024-02-01 DIAGNOSIS — N471 Phimosis: Secondary | ICD-10-CM

## 2024-02-01 DIAGNOSIS — E611 Iron deficiency: Secondary | ICD-10-CM

## 2024-02-01 MED ORDER — SILDENAFIL CITRATE 100 MG PO TABS
100.0000 mg | ORAL_TABLET | ORAL | 6 refills | Status: DC | PRN
  Filled 2024-02-01: qty 8, 8d supply, fill #0
  Filled 2024-03-05: qty 8, 8d supply, fill #1
  Filled 2024-04-11: qty 8, 8d supply, fill #2
  Filled 2024-05-16: qty 8, 8d supply, fill #3
  Filled 2024-06-18: qty 8, 30d supply, fill #4
  Filled 2024-07-21: qty 8, 60d supply, fill #5
  Filled 2024-08-23: qty 8, 60d supply, fill #6

## 2024-02-01 MED ORDER — FERROUS SULFATE 325 (65 FE) MG PO TABS
325.0000 mg | ORAL_TABLET | ORAL | 1 refills | Status: DC
  Filled 2024-02-01: qty 30, 70d supply, fill #0
  Filled 2024-02-16 – 2024-05-16 (×2): qty 30, 70d supply, fill #1

## 2024-02-01 NOTE — Telephone Encounter (Signed)
 Last apt 12/05/23

## 2024-02-02 ENCOUNTER — Encounter: Payer: Self-pay | Admitting: Cardiology

## 2024-02-06 ENCOUNTER — Other Ambulatory Visit

## 2024-02-06 DIAGNOSIS — E1165 Type 2 diabetes mellitus with hyperglycemia: Secondary | ICD-10-CM

## 2024-02-06 NOTE — Progress Notes (Signed)
 02/06/2024 Name: CHILD CAMPOY MRN: [id number] DOB: Dec 08, 1955  Chief Complaint  Patient presents with   Medication Management   Diabetes   Hypertension    NELTON AMSDEN is a 68 y.o. year old male who presented for a telephone visit.   They were referred to the pharmacist by their PCP for assistance in managing diabetes and medication access.    Subjective:  Care Team: Primary Care Provider: Annella Kief, NP ; Next Scheduled Visit: 12/24/24  Medication Access/Adherence  Current Pharmacy:  [facility] [address] [place] [address] Phone: [phone] Fax: [phone]   Patient reports affordability concerns with their medications: No Patient reports access/transportation concerns to their pharmacy: No  Patient reports adherence concerns with their medications:  No     Diabetes:  Current medications: Jardiance  10mg , Metformin  500mg  BID Medications tried in the past: Glipizide   Current glucose readings:  Checks once to twice daily, usually post-prandial in the evening Seeing readings in the 140s since restarting Jardiance , had a 160 the other day but reports highest he has seen  Observed patterns:  Patient denies hypoglycemic s/sx including dizziness, shakiness, sweating. Patient denies hyperglycemic symptoms including polyuria, polydipsia, polyphagia, nocturia, neuropathy, blurred vision.  Current medication access support: Jardiance  through [employer]  Hypertension:  Current medications: None Medications previously tried: Lisinopril , Metoprolol   Patient has a validated, automated, upper arm home BP cuff Current blood pressure readings readings: 124/89 today  Patient denies hypotensive s/sx including dizziness, lightheadedness.  Patient denies hypertensive symptoms including headache, chest pain, shortness of breath  Tobacco Abuse:  Tobacco Use History: Number of cigarettes per day  10-12  Reports he has picked up nicotine  patch prescription but has not started using them yet, reports he plans to start Monday 6/16. Reporting some cold symptoms at this time so wants to wait until then   Objective:  Lab Results  Component Value Date   HGBA1C 8.2 (H) 12/14/2023    Lab Results  Component Value Date   CREATININE 0.85 12/07/2023   BUN 13 12/07/2023   NA 135 12/07/2023   K 4.6 12/07/2023   CL 101 12/07/2023   CO2 18 (L) 12/07/2023    Lab Results  Component Value Date   CHOL 131 12/07/2023   HDL 30 (L) 12/07/2023   LDLCALC 66 12/07/2023   LDLDIRECT 83.0 04/19/2022   TRIG 213 (H) 12/07/2023   CHOLHDL 4.4 12/07/2023    Medications Reviewed Today   Medications were not reviewed in this encounter       Assessment/Plan:   Diabetes: - Currently uncontrolled per last A1C but reporting controlled readings at home - Reviewed long term cardiovascular and renal outcomes of uncontrolled blood sugar - Reviewed goal A1c, goal fasting, and goal 2 hour post prandial glucose - Reviewed dietary modifications including low carb diet -Recommend to continue current medication therapy - Recommend to check glucose daily -Recommend updated A1C around 03/07/24  Hypertension: - Currently controlled - Reviewed long term cardiovascular and renal outcomes of uncontrolled blood pressure - Reviewed appropriate blood pressure monitoring technique and reviewed goal blood pressure. Recommended to check home blood pressure and heart rate daily - Recommend to limit sodium/caffeine  -Consider re-addition of lisinopril  if BP remains elevated    Tobacco Abuse - Currently uncontrolled - Provided motivational interviewing to assess tobacco use and strategies for reduction - Provided information on 1 800 QUIT NOW support program -Patient has patches, plans to start Monday, follow up scheduled for the  following Thursday to go through and establish goals, answer questions -Counseled to  contact office for follow up if his cold worsens or does not improve    Follow Up Plan: 1 month  Carnell Christian, PharmD Clinical Pharmacist [phone]

## 2024-02-07 ENCOUNTER — Ambulatory Visit: Payer: Self-pay

## 2024-02-07 NOTE — Progress Notes (Signed)
 Chief Complaint  Patient presents with   Cough    Cough x 1.5. This Monday symptoms changed-fever started and coughing up yellow mucus along with runny nose. He said he just doesn't feel good.    Patient presents with complaint of cough. Started 1.5 weeks ago as a dry cough, has progressed to productive cough. Phlegm is yellow.  He has had low grade fever of 99-100. Mild SOB with activity, resolves with rest. Responds well to albuterol . No hemoptysis (on blood thinners). He has had a runny nose in the last week.  Nasal drainage is clear. Denies sinus pain. Scratchy throat.   He is taking coricidin, which helps some. He is also taking a coricidin cough medicine (alternates with the other coricidin). He has been using his inhaler 1-2x/day, and nebulizer treatments at night, which is helping.   No sick contacts.   PMH, PSH, SH reviewed  Hypertension, CHF, AFIB, COPD, DM Smoker--plans to put on a patch this coming Monday. He has been smoking less since sick.  Lab Results  Component Value Date   HGBA1C 8.2 (H) 12/14/2023   Outpatient Encounter Medications as of 02/08/2024  Medication Sig Note   albuterol  (PROVENTIL ) (2.5 MG/3ML) 0.083% nebulizer solution Take 3 mLs (2.5 mg total) by nebulization every 6 (six) hours as needed for wheezing or shortness of breath.    albuterol  (VENTOLIN  HFA) 108 (90 Base) MCG/ACT inhaler INHALE 2 PUFFS INTO THE LUNGS EVERY 6 HOURS AS NEEDED FOR WHEEZING OR SHORTNESS OF BREATH    apixaban  (ELIQUIS ) 5 MG TABS tablet Take 1 tablet (5 mg total) by mouth 2 (two) times daily.    ascorbic acid (VITAMIN C) 500 MG tablet Take 500 mg by mouth daily.    atorvastatin  (LIPITOR ) 80 MG tablet Take 1 tablet (80 mg total) by mouth every evening. 09/15/2023: Confirmed per patient got 6 months supply in July   buPROPion  (WELLBUTRIN  XL) 150 MG 24 hr tablet Take 3 tablets (450 mg total) by mouth daily.    Chlorphen-Phenyleph-APAP (CORICIDIN D COLD/FLU/SINUS) 2-5-325 MG TABS  Take 1 tablet by mouth as needed.    docusate sodium  (COLACE) 100 MG capsule Take 1 capsule (100 mg total) by mouth daily as needed for mild constipation.    empagliflozin  (JARDIANCE ) 10 MG TABS tablet Take 10 mg by mouth daily. Through [employer] PAP    escitalopram  (LEXAPRO ) 20 MG tablet Take 20 mg by mouth in the morning.    ezetimibe  (ZETIA ) 10 MG tablet Take 1 tablet (10 mg total) by mouth daily.    ferrous sulfate  (FEROSUL) 325 (65 FE) MG tablet Take 1 tablet (325 mg total) by mouth 3 (three) times a week.    furosemide  (LASIX ) 40 MG tablet Take 1 tablet (40mg ) daily. May take 1/2 tablet (20mg ) additional if shortness of breath or swelling occurs.    HYDROcodone -acetaminophen  (NORCO/VICODIN) 5-325 MG tablet Take 1-2 tablets by mouth 2 (two) times daily as needed for moderate pain (pain score 4-6) or severe pain (pain score 7-10).    hydrOXYzine  (ATARAX ) 25 MG tablet Take 1 tablet (25 mg total) by mouth at bedtime and may repeat dose one time if needed for sleep.    metFORMIN  (GLUCOPHAGE ) 500 MG tablet Take 1 tablet (500 mg total) by mouth 2 (two) times daily before a meal.    metoprolol  tartrate (LOPRESSOR ) 100 MG tablet Take 1 tablet (100 mg total) by mouth 2 (two) times daily.    nitroGLYCERIN  (NITROSTAT ) 0.4 MG SL tablet Place 1 tablet (  0.4 mg total) under the tongue every 5 (five) minutes as needed.    pantoprazole  (PROTONIX ) 40 MG tablet Take 1 tablet (40 mg total) by mouth daily before breakfast.    polyethylene glycol powder (GLYCOLAX /MIRALAX ) 17 GM/SCOOP powder Take one capful (17g) twice daily until soft stool, then continue one capful once daily as needed. (Patient taking differently: Take 17 g by mouth daily as needed for moderate constipation.)    potassium chloride  SA (KLOR-CON  M) 20 MEQ tablet Take 1 tablet (20 mEq total) by mouth daily.    rOPINIRole  (REQUIP ) 3 MG tablet Take 1 tablet by mouth nightly for restless leg, and a 1/2 tab during the day to control breakthrough symptoms     sildenafil  (VIAGRA ) 100 MG tablet Take 1 tablet (100 mg total) by mouth as needed for erectile dysfunction. Do not take nitroglycerine within 24 hours of last dose.    tiZANidine  (ZANAFLEX ) 4 MG tablet Take 1 tablet (4 mg total) by mouth 2 (two) times daily as needed for muscle spasms.    traZODone  (DESYREL ) 150 MG tablet Take 1 tablet (150 mg total) by mouth at bedtime.    Vitamin D , Ergocalciferol , (DRISDOL ) 1.25 MG (50000 UNIT) CAPS capsule Take 1 capsule (50,000 Units total) by mouth every 7 (seven) days.    nicotine  (NICODERM CQ  - DOSED IN MG/24 HOURS) 14 mg/24hr patch For Weeks 3-4. Place patch onto skin and wear for 24 hours. If you have sleep disturbances, remove at bedtime. (Patient not taking: Reported on 02/08/2024) 12/12/2023: Has not started yet   nicotine  (NICODERM CQ  - DOSED IN MG/24 HOURS) 21 mg/24hr patch For Weeks 1-2. Place patch onto skin and wear for 24 hours. If you have sleep disturbances, remove at bedtime. (Patient not taking: Reported on 02/08/2024) 12/12/2023: Has not started yet   nicotine  (NICODERM CQ  - DOSED IN MG/24 HR) 7 mg/24hr patch For Weeks 5-8. Place patch onto skin and wear for 24 hours. If you have sleep disturbances, remove at bedtime. (Patient not taking: Reported on 02/08/2024) 12/12/2023: Has not started yet   [DISCONTINUED] doxycycline  (VIBRA -TABS) 100 MG tablet Take 1 tablet (100 mg total) by mouth 2 (two) times daily. (Patient not taking: Reported on 12/05/2023)    Facility-Administered Encounter Medications as of 02/08/2024  Medication   lidocaine  (XYLOCAINE ) 1 % (with pres) injection 5 mL   No Known Allergies  ROS: LG fever, cough and runny nose. No HA, dizziness (rare).  DOE per HPI. No chest pain. No rash, bleeding/bruises. No n/v/d    PHYSICAL EXAM:  BP 120/70   Pulse 84   Temp 98.4 F (36.9 C) (Tympanic)   Ht 5' 8.5 (1.74 m)   Wt 213 lb 12.8 oz (97 kg)   BMI 32.04 kg/m   Wt Readings from Last 3 Encounters:  02/08/24 213 lb 12.8 oz (97  kg)  12/14/23 215 lb (97.5 kg)  12/05/23 224 lb 3.2 oz (101.7 kg)   O2 at 94%  Pleasant male, with frequent coughing spells during visit (mostly sounds dry). HEENT: conjunctiva and sclera are clear, EOMI. Nasal mucosa is normal, some clear mucus present in nares. OP clear. Sinuses nontender. TM's and EAC's normal. Neck: no lymphadenopathy or mass Heart: regular rate and rhythm Lungs: distant breath sound, no wheezes, rales or ronchi Neuro: alert and oriented.  Cranial nerves grossly intact. Mild tremor noted (when holding out finger for pulse ox) Skin: no visible rashes  COVID-19 negative  ASSESSMENT/PLAN:  COPD exacerbation (HCC) - with purulent sputum. Start  Doxy, risks/SE reviewed. Cont albuterol , responding well, and A1c >8 so will avoid steroids currently - Plan: doxycycline  (VIBRA -TABS) 100 MG tablet  Cough, unspecified type - Plan: POC COVID-19  Runny nose - Plan: POC COVID-19  Type 2 diabetes mellitus with hyperglycemia, without long-term current use of insulin  (HCC) - A1c 8.2 on last check, partly related to steroids.  Will avoid steroids now (contact us  if breathing worsens). cont current meds  Take the doxycycline  for 10 days.  Be sure to use sunscreen when in the sun. Continue to use your albuterol  inhaler and nebulizer as needed  Stop taking both types of Coricidin that you have. Instead, I recommend taking Mucinex  DM 12 hour, and taking it twice daily.  This contains an expectorant to thin out the mucus, and also has a cough suppressant. You may use an antihistamine such as claritin or zyrtec once daily.  This can help dry up the nose, which might be draining down your throat and contributing to cough.   Stay well hydrated.  Contact us /return for re-evaluation if you have persistent or worsening fever, cough, shortness of breath, or new concerns. I'm holding off on prescribing steroids since your lungs sounded pretty clear and you are having a good response to your  albuterol  at home.

## 2024-02-07 NOTE — Telephone Encounter (Signed)
      FYI Only or Action Required?: Action required by provider  Patient was last seen in primary care on 12/05/2023 by Early, Adriane Albe, NP. Called Nurse Triage reporting Cough. Symptoms began a week ago. Interventions attempted: OTC medications: coricidin, nebulizer treatments, cough medicine, shower/humidifier. Symptoms are: gradually worsening.  Triage Disposition: See HCP Within 4 Hours (Or PCP Triage)  Patient/caregiver understands and will follow disposition?: Yes      Summary: cough and congestion   Copied From CRM [id number]. Reason for Triage: The patient would like to please speak with a member of clinical staff when possible about congestion and a cough that they are currently experiencing. The patient shares that they've experienced an elevated temperature as well of roughly 99 degrees.         Reason for Disposition  [1] MILD difficulty breathing (e.g., minimal/no SOB at rest, SOB with walking, pulse <100) AND [2] still present when not coughing  Answer Assessment - Initial Assessment Questions 1. ONSET: When did the cough begin?      1.5 weeks, dry cough, has progressed to productive cough 2. SEVERITY: How bad is the cough today?      Moderate -  severe. Has worsened over the past 24 hours 3. SPUTUM: Describe the color of your sputum (none, dry cough; clear, white, yellow, green)     Yes, yellow 4. HEMOPTYSIS: Are you coughing up any blood? If so ask: How much? (flecks, streaks, tablespoons, etc.)     no 5. DIFFICULTY BREATHING: Are you having difficulty breathing? If Yes, ask: How bad is it? (e.g., mild, moderate, severe)    - MILD: No SOB at rest, mild SOB with walking, speaks normally in sentences, can lie down, no retractions, pulse < 100.    - MODERATE: SOB at rest, SOB with minimal exertion and prefers to sit, cannot lie down flat, speaks in phrases, mild retractions, audible wheezing, pulse 100-120.    - SEVERE: Very SOB at rest, speaks in single words,  struggling to breathe, sitting hunched forward, retractions, pulse > 120      Mild SOB with activity, resolves with rest 6. FEVER: Do you have a fever? If Yes, ask: What is your temperature, how was it measured, and when did it start?     T max 99 today 7. CARDIAC HISTORY: Do you have any history of heart disease? (e.g., heart attack, congestive heart failure)      Hypertension, CHF, AFIB 8. LUNG HISTORY: Do you have any history of lung disease?  (e.g., pulmonary embolus, asthma, emphysema)     none 9. PE RISK FACTORS: Do you have a history of blood clots? (or: recent major surgery, recent prolonged travel, bedridden)     no 10. OTHER SYMPTOMS: Do you have any other symptoms? (e.g., runny nose, wheezing, chest pain)       Runny nose Patient states that he was on a vent for 14 days three years ago for pneumonia and since then, he has struggled with cough and congestion  Patient would prefer for antibiotics and steroid to be called in as opposed to driving anywhere as he feels poorly  Protocols used: Cough - Acute Productive-A-AH

## 2024-02-08 ENCOUNTER — Other Ambulatory Visit (HOSPITAL_BASED_OUTPATIENT_CLINIC_OR_DEPARTMENT_OTHER): Payer: Self-pay

## 2024-02-08 ENCOUNTER — Encounter: Payer: Self-pay | Admitting: Family Medicine

## 2024-02-08 ENCOUNTER — Ambulatory Visit: Admitting: Family Medicine

## 2024-02-08 VITALS — BP 120/70 | HR 84 | Temp 98.4°F | Ht 68.5 in | Wt 213.8 lb

## 2024-02-08 DIAGNOSIS — R059 Cough, unspecified: Secondary | ICD-10-CM

## 2024-02-08 DIAGNOSIS — R0989 Other specified symptoms and signs involving the circulatory and respiratory systems: Secondary | ICD-10-CM | POA: Diagnosis not present

## 2024-02-08 DIAGNOSIS — J441 Chronic obstructive pulmonary disease with (acute) exacerbation: Secondary | ICD-10-CM

## 2024-02-08 DIAGNOSIS — E1165 Type 2 diabetes mellitus with hyperglycemia: Secondary | ICD-10-CM | POA: Diagnosis not present

## 2024-02-08 LAB — POC COVID19 BINAXNOW: SARS Coronavirus 2 Ag: NEGATIVE

## 2024-02-08 MED ORDER — DOXYCYCLINE HYCLATE 100 MG PO TABS
100.0000 mg | ORAL_TABLET | Freq: Two times a day (BID) | ORAL | 0 refills | Status: DC
  Filled 2024-02-08: qty 20, 10d supply, fill #0

## 2024-02-08 NOTE — Patient Instructions (Addendum)
 Take the doxycycline  for 10 days.  Be sure to use sunscreen when in the sun. Continue to use your albuterol  inhaler and nebulizer as needed  Stop taking both types of Coricidin that you have. Instead, I recommend taking Mucinex  DM 12 hour, and taking it twice daily.  This contains an expectorant to thin out the mucus, and also has a cough suppressant. You may use an antihistamine such as claritin or zyrtec once daily.  This can help dry up the nose, which might be draining down your throat and contributing to cough.   Stay well hydrated.  Contact us /return for re-evaluation if you have persistent or worsening fever, cough, shortness of breath, or new concerns. I'm holding off on prescribing steroids since your lungs sounded pretty clear and you are having a good response to your albuterol  at home.  Continue to try and not smoke. You are welcome to start the patches sooner, if needed.

## 2024-02-09 ENCOUNTER — Other Ambulatory Visit (HOSPITAL_BASED_OUTPATIENT_CLINIC_OR_DEPARTMENT_OTHER): Payer: Self-pay

## 2024-02-10 ENCOUNTER — Other Ambulatory Visit (HOSPITAL_BASED_OUTPATIENT_CLINIC_OR_DEPARTMENT_OTHER): Payer: Self-pay | Admitting: Nurse Practitioner

## 2024-02-10 ENCOUNTER — Other Ambulatory Visit (HOSPITAL_BASED_OUTPATIENT_CLINIC_OR_DEPARTMENT_OTHER): Payer: Self-pay

## 2024-02-10 DIAGNOSIS — E785 Hyperlipidemia, unspecified: Secondary | ICD-10-CM

## 2024-02-10 DIAGNOSIS — I5032 Chronic diastolic (congestive) heart failure: Secondary | ICD-10-CM

## 2024-02-10 DIAGNOSIS — Z01810 Encounter for preprocedural cardiovascular examination: Secondary | ICD-10-CM

## 2024-02-12 ENCOUNTER — Other Ambulatory Visit (HOSPITAL_BASED_OUTPATIENT_CLINIC_OR_DEPARTMENT_OTHER): Payer: Self-pay

## 2024-02-12 MED ORDER — FUROSEMIDE 40 MG PO TABS
ORAL_TABLET | ORAL | 1 refills | Status: DC
  Filled 2024-02-12: qty 120, 80d supply, fill #0
  Filled 2024-04-24: qty 120, 80d supply, fill #1

## 2024-02-15 ENCOUNTER — Other Ambulatory Visit

## 2024-02-15 DIAGNOSIS — F17210 Nicotine dependence, cigarettes, uncomplicated: Secondary | ICD-10-CM

## 2024-02-15 NOTE — Progress Notes (Signed)
   02/15/2024 Name: Shaun [name] MRN: [id number] DOB: April 16, 1956  Chief Complaint  Patient presents with   Medication Management   Nicotine  Dependence    Shaun [name] is a 68 y.o. year old male who presented for a telephone visit.   They were referred to the pharmacist by their PCP for assistance in managing diabetes and medication access.    Subjective:  Care Team: Primary Care Provider: Annella Kief, NP ; Next Scheduled Visit: 12/24/24  Medication Access/Adherence  Current Pharmacy:  [facility] [address] [place] [address] Phone: [phone] Fax: [phone]   Patient reports affordability concerns with their medications: No Patient reports access/transportation concerns to their pharmacy: No  Patient reports adherence concerns with their medications:  No       Tobacco Abuse:  Tobacco Use History: Number of cigarettes per day 10-12 prior to recent COPD exacerbation As of this call, last cigarette was Saturday morning  Reports he has picked up nicotine  patch prescription but has not started using them yet, reports he may be interested in trying to quit without using them   Objective:  Lab Results  Component Value Date   HGBA1C 8.2 (H) 12/14/2023    Lab Results  Component Value Date   CREATININE 0.85 12/07/2023   BUN 13 12/07/2023   NA 135 12/07/2023   K 4.6 12/07/2023   CL 101 12/07/2023   CO2 18 (L) 12/07/2023    Lab Results  Component Value Date   CHOL 131 12/07/2023   HDL 30 (L) 12/07/2023   LDLCALC 66 12/07/2023   LDLDIRECT 83.0 04/19/2022   TRIG 213 (H) 12/07/2023   CHOLHDL 4.4 12/07/2023    Medications Reviewed Today   Medications were not reviewed in this encounter       Assessment/Plan:    Tobacco Abuse - Currently uncontrolled - Provided motivational interviewing to assess tobacco use and strategies for reduction - Provided information on 1 800 QUIT NOW  support program -Discussed that if he gets a craving and wants to use a patch, he may apply one or call me sooner than scheduled follow up if he would like a fresh review of how to use     Follow Up Plan: 2 weeks  Carnell Christian, PharmD Clinical Pharmacist [phone]

## 2024-02-16 ENCOUNTER — Other Ambulatory Visit (HOSPITAL_BASED_OUTPATIENT_CLINIC_OR_DEPARTMENT_OTHER): Payer: Self-pay

## 2024-02-19 ENCOUNTER — Other Ambulatory Visit: Payer: Self-pay | Admitting: Nurse Practitioner

## 2024-02-19 DIAGNOSIS — M2550 Pain in unspecified joint: Secondary | ICD-10-CM

## 2024-02-20 ENCOUNTER — Other Ambulatory Visit: Payer: Self-pay

## 2024-02-20 ENCOUNTER — Other Ambulatory Visit (HOSPITAL_BASED_OUTPATIENT_CLINIC_OR_DEPARTMENT_OTHER): Payer: Self-pay

## 2024-02-20 ENCOUNTER — Other Ambulatory Visit: Payer: Self-pay | Admitting: Nurse Practitioner

## 2024-02-20 DIAGNOSIS — G2581 Restless legs syndrome: Secondary | ICD-10-CM

## 2024-02-20 MED ORDER — ROPINIROLE HCL 3 MG PO TABS
ORAL_TABLET | ORAL | 1 refills | Status: DC
  Filled 2024-02-20: qty 45, 30d supply, fill #0
  Filled 2024-03-23: qty 45, 30d supply, fill #1

## 2024-02-20 NOTE — Telephone Encounter (Signed)
 Last apt 12/05/23

## 2024-02-21 ENCOUNTER — Other Ambulatory Visit (HOSPITAL_BASED_OUTPATIENT_CLINIC_OR_DEPARTMENT_OTHER): Payer: Self-pay

## 2024-02-21 MED ORDER — HYDROCODONE-ACETAMINOPHEN 5-325 MG PO TABS
1.0000 | ORAL_TABLET | Freq: Two times a day (BID) | ORAL | 0 refills | Status: DC | PRN
  Filled 2024-02-21: qty 120, 30d supply, fill #0

## 2024-02-29 ENCOUNTER — Other Ambulatory Visit

## 2024-02-29 DIAGNOSIS — F17210 Nicotine dependence, cigarettes, uncomplicated: Secondary | ICD-10-CM

## 2024-02-29 NOTE — Progress Notes (Signed)
 02/29/2024 Name: Shaun [name] MRN: [id number] DOB: 1956/04/11  Chief Complaint  Patient presents with   Medication Management    Smoking Cessation    Shaun [name] is a 68 y.o. year old male who presented for a telephone visit.   They were referred to the pharmacist by their PCP for assistance in managing diabetes and medication access.    Subjective:  Care Team: Primary Care Provider: Oris Camie BRAVO, NP ; Next Scheduled Visit: 12/24/24  Medication Access/Adherence  Current Pharmacy:  [facility] [address] [place] [address] Phone: [phone] Fax: [phone]   Patient reports affordability concerns with their medications: No Patient reports access/transportation concerns to their pharmacy: No  Patient reports adherence concerns with their medications:  No       Tobacco Abuse:  Tobacco Use History: Number of cigarettes per day 10-12 prior to recent COPD exacerbation Currently smoking 5 cigarettes per day Wearing the Nicotine  21mg  patch (started 02/24/24) and reports this is helping with the cravings tremendously  Denies any issues or concerns with using the patch   Objective:  Lab Results  Component Value Date   HGBA1C 8.2 (H) 12/14/2023    Lab Results  Component Value Date   CREATININE 0.85 12/07/2023   BUN 13 12/07/2023   NA 135 12/07/2023   K 4.6 12/07/2023   CL 101 12/07/2023   CO2 18 (L) 12/07/2023    Lab Results  Component Value Date   CHOL 131 12/07/2023   HDL 30 (L) 12/07/2023   LDLCALC 66 12/07/2023   LDLDIRECT 83.0 04/19/2022   TRIG 213 (H) 12/07/2023   CHOLHDL 4.4 12/07/2023    Medications Reviewed Today     Reviewed by Lionell Jon DEL, RPH (Pharmacist) on 02/29/24 at 1440  Med List Status: <None>   Medication Order Taking? Sig Documenting Provider Last Dose Status Informant  albuterol  (PROVENTIL ) (2.5 MG/3ML) 0.083% nebulizer solution [id number]  Take 3 mLs  (2.5 mg total) by nebulization every 6 (six) hours as needed for wheezing or shortness of breath. Dreama Longs, MD  Active Self           Med Note SOILA, BROOKE C   Tue Dec 12, 2023 10:12 AM)    albuterol  (VENTOLIN  HFA) 108 [phone] Base) MCG/ACT inhaler [id number]  INHALE 2 PUFFS INTO THE LUNGS EVERY 6 HOURS AS NEEDED FOR WHEEZING OR SHORTNESS OF BREATH Tysinger, Alm RAMAN, PA-C  Active Self           Med Note SOILA, BROOKE C   Tue Dec 12, 2023 10:12 AM)    apixaban  (ELIQUIS ) 5 MG TABS tablet [id number]  Take 1 tablet (5 mg total) by mouth 2 (two) times daily. Pietro Redell RAMAN, MD  Active Self  ascorbic acid (VITAMIN C) 500 MG tablet [id number]  Take 500 mg by mouth daily. [provider]  Active Self  atorvastatin  (LIPITOR ) 80 MG tablet [id number]  Take 1 tablet (80 mg total) by mouth every evening. Pietro Redell RAMAN, MD  Active Self           Med Note CHRISTIE ALEXANDER   Fri Sep 15, 2023 12:20 PM) Confirmed per patient got 6 months supply in July  buPROPion  (WELLBUTRIN  XL) 150 MG 24 hr tablet [id number]  Take 3 tablets (450 mg total) by mouth daily. Early, Sara E, NP  Active Self  Chlorphen-Phenyleph-APAP (CORICIDIN D COLD/FLU/SINUS) 2-5-325 MG TABS [id number]  Take 1 tablet by mouth as needed. [provider]  Active   docusate sodium  (COLACE) 100 MG capsule [id number]  Take 1 capsule (100 mg total) by mouth daily as needed for mild constipation. Early, Sara E, NP  Active Self  doxycycline  (VIBRA -TABS) 100 MG tablet [id number]  Take 1 tablet (100 mg total) by mouth 2 (two) times daily. Randol Dawes, MD  Active   empagliflozin  (JARDIANCE ) 10 MG TABS tablet [id number]  Take 10 mg by mouth daily. Through [employer] PAP [provider]  Active Self  escitalopram  (LEXAPRO ) 20 MG tablet [id number]  Take 20 mg by mouth in the morning. [provider]  Active Self  ezetimibe  (ZETIA ) 10 MG tablet [id number]  Take 1 tablet (10 mg total) by mouth daily. Pietro Redell RAMAN, MD  Active  Self  ferrous sulfate  (FEROSUL) 325 (65 FE) MG tablet [id number]  Take 1 tablet (325 mg total) by mouth 3 (three) times a week. Early, Sara E, NP  Active   furosemide  (LASIX ) 40 MG tablet [id number]  Take 1 tablet (40mg ) daily. May take 1/2 tablet (20mg ) additional if shortness of breath or swelling occurs. Early, Sara E, NP  Active   HYDROcodone -acetaminophen  (NORCO/VICODIN) 5-325 MG tablet [id number]  Take 1-2 tablets by mouth 2 (two) times daily as needed for moderate pain (pain score 4-6) or severe pain (pain score 7-10). Early, Sara E, NP  Active   hydrOXYzine  (ATARAX ) 25 MG tablet [id number]  Take 1 tablet (25 mg total) by mouth at bedtime and may repeat dose one time if needed for sleep. Early, Sara E, NP  Active Self  lidocaine  (XYLOCAINE ) 1 % (with pres) injection 5 mL [id number]   Carilyn Prentice BRAVO, MD  Active   metFORMIN  (GLUCOPHAGE ) 500 MG tablet [id number]  Take 1 tablet (500 mg total) by mouth 2 (two) times daily before a meal. Early, Camie BRAVO, NP  Active   metoprolol  tartrate (LOPRESSOR ) 100 MG tablet [id number]  Take 1 tablet (100 mg total) by mouth 2 (two) times daily. Meng, Hao, [place]  Active Self  nicotine  (NICODERM CQ  - DOSED IN MG/24 HOURS) 14 mg/24hr patch [id number]  For Weeks 3-4. Place patch onto skin and wear for 24 hours. If you have sleep disturbances, remove at bedtime.  Patient not taking: Reported on 02/08/2024   Oris Camie BRAVO, NP  Active Self           Med Note SOILA, BROOKE C   Tue Dec 12, 2023 10:19 AM) Has not started yet  nicotine  (NICODERM CQ  - DOSED IN MG/24 HOURS) 21 mg/24hr patch [id number] Yes For Weeks 1-2. Place patch onto skin and wear for 24 hours. If you have sleep disturbances, remove at bedtime. Early, Sara E, NP  Active Self           Med Note SOILA, BROOKE C   Tue Dec 12, 2023 10:19 AM) Has not started yet  nicotine  (NICODERM CQ  - DOSED IN MG/24 HR) 7 mg/24hr patch [id number]  For Weeks 5-8. Place patch onto skin and wear for 24 hours. If you have sleep  disturbances, remove at bedtime.  Patient not taking: Reported on 02/08/2024   Oris Camie BRAVO, NP  Active Self           Med Note SOILA, BROOKE C   Tue Dec 12, 2023 10:19 AM) Has not started yet  nitroGLYCERIN  (NITROSTAT ) 0.4 MG SL tablet [id number]  Place 1 tablet (0.4 mg total) under the tongue every 5 (five) minutes as needed. Early, Sara E, NP  Active Self  Med Note SOILA, BROOKE C   Tue Dec 12, 2023 10:16 AM)    pantoprazole  (PROTONIX ) 40 MG tablet [id number]  Take 1 tablet (40 mg total) by mouth daily before breakfast. Ezzard Sonny RAMAN, PA-C  Active Self  polyethylene glycol powder (GLYCOLAX /MIRALAX ) 17 GM/SCOOP powder [id number]  Take one capful (17g) twice daily until soft stool, then continue one capful once daily as needed.  Patient taking differently: Take 17 g by mouth daily as needed for moderate constipation.   Ezzard Sonny RAMAN, PA-C  Active Self  potassium chloride  SA (KLOR-CON  M) 20 MEQ tablet [id number]  Take 1 tablet (20 mEq total) by mouth daily. Pietro Redell RAMAN, MD  Active Self  rOPINIRole  (REQUIP ) 3 MG tablet [id number]  Take 1 tablet (3 mg total) by mouth at bedtime, AND 0.5 tablets (1.5 mg total) during the day for breakthrough symptoms. Early, Sara E, NP  Active   sildenafil  (VIAGRA ) 100 MG tablet [id number]  Take 1 tablet (100 mg total) by mouth as needed for erectile dysfunction. Do not take nitroglycerine within 24 hours of last dose. Early, Sara E, NP  Active   tiZANidine  (ZANAFLEX ) 4 MG tablet [id number]  Take 1 tablet (4 mg total) by mouth 2 (two) times daily as needed for muscle spasms. Early, Sara E, NP  Active Self  traZODone  (DESYREL ) 150 MG tablet [id number]  Take 1 tablet (150 mg total) by mouth at bedtime. Early, Sara E, NP  Active Self  Vitamin D , Ergocalciferol , (DRISDOL ) 1.25 MG (50000 UNIT) CAPS capsule [id number]  Take 1 capsule (50,000 Units total) by mouth every 7 (seven) days. Early, Sara E, NP  Active Self              Assessment/Plan:     Tobacco Abuse - Currently uncontrolled - Provided motivational interviewing to assess tobacco use and strategies for reduction - Provided information on 1 800 QUIT NOW support program, patient is going to reach out and request lozenges via the program to assist with breakthrough cravings whilst wearing the patch -Set goal of being down to 2-3 cigarettes per day in 2 weeks     Follow Up Plan: 2 weeks  Jon VEAR Lindau, PharmD Clinical Pharmacist [phone]

## 2024-03-05 ENCOUNTER — Other Ambulatory Visit: Payer: Self-pay | Admitting: Nurse Practitioner

## 2024-03-05 DIAGNOSIS — M2550 Pain in unspecified joint: Secondary | ICD-10-CM

## 2024-03-06 ENCOUNTER — Other Ambulatory Visit (HOSPITAL_BASED_OUTPATIENT_CLINIC_OR_DEPARTMENT_OTHER): Payer: Self-pay

## 2024-03-06 MED ORDER — TIZANIDINE HCL 4 MG PO TABS
4.0000 mg | ORAL_TABLET | Freq: Two times a day (BID) | ORAL | 2 refills | Status: DC | PRN
  Filled 2024-03-06: qty 60, 30d supply, fill #0

## 2024-03-06 NOTE — Telephone Encounter (Signed)
 Last apt 12/05/23

## 2024-03-13 ENCOUNTER — Telehealth: Payer: Self-pay

## 2024-03-13 NOTE — Telephone Encounter (Signed)
 I called the pt. Back and got him scheduled for a lab visit to recheck his A1C future order was already in the system from [facility].   Copied from CRM [id number]. Topic: Appointments - Appointment Info/Confirmation >> Mar 13, 2024  1:55 PM Marissa P wrote: Patient/patient representative is calling for information regarding an appointment.  Patient called saying he missed April 10th appt still needs the blood test please advise if he can get rescheduled. Callback# [phone]

## 2024-03-14 ENCOUNTER — Other Ambulatory Visit (HOSPITAL_BASED_OUTPATIENT_CLINIC_OR_DEPARTMENT_OTHER): Payer: Self-pay | Admitting: Cardiology

## 2024-03-14 ENCOUNTER — Other Ambulatory Visit

## 2024-03-14 ENCOUNTER — Other Ambulatory Visit (HOSPITAL_BASED_OUTPATIENT_CLINIC_OR_DEPARTMENT_OTHER): Payer: Self-pay

## 2024-03-14 DIAGNOSIS — E1165 Type 2 diabetes mellitus with hyperglycemia: Secondary | ICD-10-CM

## 2024-03-14 DIAGNOSIS — E785 Hyperlipidemia, unspecified: Secondary | ICD-10-CM

## 2024-03-14 DIAGNOSIS — F17210 Nicotine dependence, cigarettes, uncomplicated: Secondary | ICD-10-CM

## 2024-03-14 LAB — HEMOGLOBIN A1C
Est. average glucose Bld gHb Est-mCnc: 169 mg/dL
Hgb A1c MFr Bld: 7.5 % — ABNORMAL HIGH (ref 4.8–5.6)

## 2024-03-14 MED ORDER — ATORVASTATIN CALCIUM 80 MG PO TABS
80.0000 mg | ORAL_TABLET | Freq: Every evening | ORAL | 1 refills | Status: AC
  Filled 2024-04-24: qty 90, 90d supply, fill #0

## 2024-03-14 NOTE — Progress Notes (Signed)
   03/14/2024 Name: Shaun [name] MRN: [id number] DOB: 1956/06/16  Chief Complaint  Patient presents with   Medication Management    Smoking Cessation    Shaun [name] is a 68 y.o. year old male who presented for a telephone visit.   They were referred to the pharmacist by their PCP for assistance in managing diabetes and medication access.    Subjective:  Care Team: Primary Care Provider: Oris Camie BRAVO, NP ; Next Scheduled Visit: 12/24/24  Medication Access/Adherence  Current Pharmacy:  [facility] [address] [place] [address] Phone: [phone] Fax: [phone]   Patient reports affordability concerns with their medications: No Patient reports access/transportation concerns to their pharmacy: No  Patient reports adherence concerns with their medications:  No       Tobacco Abuse:  Tobacco Use History: Number of cigarettes per day 10-12 prior to recent COPD exacerbation Currently smoking 3 cigarettes per day Wearing the Nicotine  14 mg patch and reports this is helping with the cravings tremendously  Denies any issues or concerns with using the patch  Has not pursued use of Nicotine  gum or lozenge for breakthrough cravings yet, has been using sugar free cinnamon candy as a distraction in place of cigarette  Objective:  Lab Results  Component Value Date   HGBA1C 8.2 (H) 12/14/2023    Lab Results  Component Value Date   CREATININE 0.85 12/07/2023   BUN 13 12/07/2023   NA 135 12/07/2023   K 4.6 12/07/2023   CL 101 12/07/2023   CO2 18 (L) 12/07/2023    Lab Results  Component Value Date   CHOL 131 12/07/2023   HDL 30 (L) 12/07/2023   LDLCALC 66 12/07/2023   LDLDIRECT 83.0 04/19/2022   TRIG 213 (H) 12/07/2023   CHOLHDL 4.4 12/07/2023    Medications Reviewed Today   Medications were not reviewed in this encounter       Assessment/Plan:    Tobacco Abuse - Currently  uncontrolled - Provided motivational interviewing to assess tobacco use and strategies for reduction - Provided information on 1 800 QUIT NOW support program, patient is going to reach out and request lozenges via the program to assist with breakthrough cravings whilst wearing the patch -Set quit date of 03/28/24. -Requested refill on nicotine  7mg  from pharmacy on behalf of patient.     Follow Up Plan: 2 weeks  Jon VEAR Lindau, PharmD Clinical Pharmacist [phone]

## 2024-03-20 ENCOUNTER — Ambulatory Visit: Payer: Self-pay | Admitting: Nurse Practitioner

## 2024-03-25 ENCOUNTER — Other Ambulatory Visit (HOSPITAL_BASED_OUTPATIENT_CLINIC_OR_DEPARTMENT_OTHER): Payer: Self-pay

## 2024-03-25 ENCOUNTER — Ambulatory Visit: Payer: Medicare Other | Admitting: Internal Medicine

## 2024-03-25 ENCOUNTER — Encounter: Payer: Self-pay | Admitting: Internal Medicine

## 2024-03-25 VITALS — BP 120/76 | HR 88 | Ht 68.5 in | Wt 212.0 lb

## 2024-03-25 DIAGNOSIS — G250 Essential tremor: Secondary | ICD-10-CM

## 2024-03-25 DIAGNOSIS — E0590 Thyrotoxicosis, unspecified without thyrotoxic crisis or storm: Secondary | ICD-10-CM

## 2024-03-25 LAB — TSH: TSH: 0.48 m[IU]/L (ref 0.40–4.50)

## 2024-03-25 LAB — T4, FREE: Free T4: 1.3 ng/dL (ref 0.8–1.8)

## 2024-03-25 MED ORDER — METHIMAZOLE 5 MG PO TABS
5.0000 mg | ORAL_TABLET | ORAL | 3 refills | Status: AC
  Filled 2024-03-25: qty 52, 90d supply, fill #0
  Filled 2024-04-24 – 2024-06-18 (×2): qty 52, 90d supply, fill #1
  Filled 2024-09-16: qty 52, 90d supply, fill #2

## 2024-03-25 NOTE — Progress Notes (Unsigned)
 Name: Shaun [name]  MRN/ DOB: [id number], [birth year]    Age/ Sex: 68 y.o., male    PCP: Early, Camie BRAVO, NP   Reason for Endocrinology Evaluation: Subclinical hyperthyroidism     Date of Initial Endocrinology Evaluation: 01/16/2023    HPI: Mr. AYRTON MCVAY is a 68 y.o. male with a past medical history of atrial fibrillation, CAD, HTN, CHF, dyslipidemia, DM. The patient presented for initial endocrinology clinic visit on 01/16/2023 for consultative assistance with his subclinical hyperthyroidism.   Patient has been noted with low TSH 03/2022 at <0.01 u IU/mL, with repeat TSH of 0.033u IU/mL 05/2022.  Of note the patient has had normal free T4 and T3   Patient follows with cardiology for A-fib, CAD, and CHF   He is not on amiodarone , except for amiodarone  infusion 07/2021    No Fh of thyroid  disease  TRAb negative  12/2022  Patient was started on methimazole  12/2022 with a TSH of 0.350 u IU/mL, normal free T4 and T3  SUBJECTIVE:    Today (03/25/24):  Vinie JONETTA Hengst is here for follow-up on hyperthyroidism.   Patient continues to follow-up with cardiology for CAD , CHF, and A-fib He is s/p cardiac cath 09/19/2023  He is scheduled for right hip surgery 04/19/2024  Weight continues to trend down, intentional  Denies local neck swelling  Has occasional palpitations  Continues with chronic tremors , these have been bothersome as he likes to work with his hands Denies diarrhea or loose stools but has rare constipation   Methimazole  5 mg, 4 days a week ( Monday, Wednesday , Friday and Saturday )    HISTORY:  Past Medical History:  Past Medical History:  Diagnosis Date   A-fib (HCC)    Anginal pain (HCC)    Anxiety    Bacterial URI 08/29/2022   CAD (coronary artery disease)    DES to distal circumflex 2016   Cataract    CHF (congestive heart failure) (HCC)    Colon polyps    30 colon polyps found on first colonoscopy   Complication of anesthesia     COPD (chronic obstructive pulmonary disease) (HCC)    Depression    Diastolic heart failure (HCC)    Diverticulitis    DJD (degenerative joint disease)    Dyspnea    Dysrhythmia    A-Fib   Genetic testing 09/09/2014   Negative genetic testing on the ColoNext panel test and the MSH2 inversion testing.  The ColoNext gene panel offered by [facility] and includes sequencing and rearrangement analysis for the following 17 genes: APC, BMPR1A, CDH1, CHEK2, EPCAM, GREM1, MLH1, MSH2, MSH6, MUTYH, PMS2, POLD1, POLE, PTEN, SMAD4, STK11, and TP53.   The report date is 09/08/14.      GERD (gastroesophageal reflux disease)    History of kidney stones    Hyperlipidemia    Hypernatremia 08/22/2021   Hypertension    Insomnia    Obstructive sleep apnea 12/2009   01/26/2010 AHI 83/hr   Permanent atrial fibrillation (HCC)    Onset 2006 paroxysmal then progressive to persistent   Pneumonia    PONV (postoperative nausea and vomiting)    PUD (peptic ulcer disease)    1980s   RLS (restless legs syndrome)    Sinusitis    Skin cancer    Type 2 diabetes mellitus [facility])    Past Surgical History:  Past Surgical History:  Procedure Laterality Date   BIOPSY  07/17/2018   Procedure: BIOPSY;  Surgeon: Harvey Margo CROME, MD;  [facility];  Service: Endoscopy;;  colon   BOWEL RESECTION  09/17/2018   SMALL BOWEL RESECTION: 71 CM    CARDIAC CATHETERIZATION N/A 07/21/2015   Procedure: Left Heart Cath and Coronary Angiography;  Surgeon: Peter M Swaziland, MD;  Location: [facility] INVASIVE CV LAB;  Service: Cardiovascular;  Laterality: N/A;   CARDIAC CATHETERIZATION N/A 07/21/2015   Procedure: Coronary Stent Intervention;  Surgeon: Peter M Swaziland, MD;  Location: [facility] INVASIVE CV LAB;  Service: Cardiovascular;  Laterality: N/A;   CIRCUMCISION N/A 04/05/2019   Procedure: CIRCUMCISION ADULT;  Surgeon: Watt Rush, MD;  Location: [facility] ORS;  Service: Urology;  Laterality: N/A;   COLONOSCOPY N/A 05/19/2014   Dr. Sharla  diverticulosis/moderate external hemorrhoids, >20 simple adenomas. Genetic screening negative.    COLONOSCOPY WITH PROPOFOL  N/A 07/17/2018   Dr. harvey: Diverticulosis, external/internal hemorrhoids, 32 colon polyps removed.  ten tubular adenomas removed with no high-grade dysplasia.  Advised to have surveillance colonoscopy in 3 years.   COLONOSCOPY WITH PROPOFOL  N/A 06/07/2021   Procedure: COLONOSCOPY WITH PROPOFOL ;  Surgeon: Cindie Carlin POUR, DO;  Location: [facility];  Service: Endoscopy;  Laterality: N/A;  9:30 / ASA 3  (Pt was told that his time will be given at Pre-op)   CORONARY PRESSURE/FFR STUDY Left 06/08/2017   Procedure: INTRAVASCULAR PRESSURE WIRE/FFR STUDY;  Surgeon: Mady Bruckner, MD;  Location: [facility] INVASIVE CV LAB;  Service: Cardiovascular;  Laterality: Left;  LAD and CFX   ESOPHAGOGASTRODUODENOSCOPY (EGD) WITH PROPOFOL  N/A 07/17/2018   Dr. harvey: Low-grade narrowing Schatzki ring at the GE junction status post dilation.  Gastritis.  Biopsy with mild nonspecific reactive gastropathy.  No H. pylori.   GIVENS CAPSULE STUDY N/A 06/24/2019   normal   HERNIA REPAIR  1986   Left inguinal   INCISIONAL HERNIA REPAIR N/A 10/19/2022   Procedure: OPEN INCISIONAL HERNIA REPAIR;  Surgeon: Dasie Leonor CROME, MD;  Location: [facility] OR;  Service: General;  Laterality: N/A;   INSERTION OF MESH N/A 10/19/2022   Procedure: INSERTION OF MESH;  Surgeon: Dasie Leonor CROME, MD;  Location: [facility] OR;  Service: General;  Laterality: N/A;   LAPAROTOMY N/A 09/17/2018   Procedure: EXPLORATORY LAPAROTOMY;  Surgeon: Kallie Manuelita BROCKS, MD;  Location: [facility];  Service: General;  Laterality: N/A;   LEFT HEART CATH AND CORONARY ANGIOGRAPHY N/A 06/08/2017   Procedure: LEFT HEART CATH AND CORONARY ANGIOGRAPHY;  Surgeon: Mady Bruckner, MD;  Location: [facility] INVASIVE CV LAB;  Service: Cardiovascular;  Laterality: N/A;   POLYPECTOMY  07/17/2018   Procedure: POLYPECTOMY;  Surgeon: Harvey Margo CROME, MD;  [facility];  Service: Endoscopy;;  colon   POLYPECTOMY  06/07/2021   Procedure: POLYPECTOMY INTESTINAL;  Surgeon: Cindie Carlin POUR, DO;  [facility];  Service: Endoscopy;;   RIGHT/LEFT HEART CATH AND CORONARY ANGIOGRAPHY N/A 09/19/2023   Procedure: RIGHT/LEFT HEART CATH AND CORONARY ANGIOGRAPHY;  Surgeon: Verlin Bruckner BIRCH, MD;  Location: [facility] INVASIVE CV LAB;  Service: Cardiovascular;  Laterality: N/A;   ROTATOR CUFF REPAIR     Right   SAVORY DILATION N/A 07/17/2018   Procedure: SAVORY DILATION;  Surgeon: Harvey Margo CROME, MD;  [facility];  Service: Endoscopy;  Laterality: N/A;    Social History:  reports that he has been smoking cigarettes. He has been exposed to tobacco smoke. He has never used smokeless tobacco. He reports that he does not drink alcohol and does not use drugs. Family History: family history  includes Brain cancer in his maternal uncle; Breast cancer in his cousin; Breast cancer (age of onset: 70) in his mother; Cancer in his cousin and maternal uncle; Diabetes in his brother; Heart attack in his father; Hypertension in his mother; Parkinson's disease in his brother; Skin cancer (age of onset: 70) in his sister.   HOME MEDICATIONS: Allergies as of 03/25/2024   No Known Allergies      Medication List        Accurate as of March 25, 2024  9:12 AM. If you have any questions, ask your nurse or doctor.          albuterol  108 (90 Base) MCG/ACT inhaler Commonly known as: Ventolin  HFA INHALE 2 PUFFS INTO THE LUNGS EVERY 6 HOURS AS NEEDED FOR WHEEZING OR SHORTNESS OF BREATH   albuterol  (2.5 MG/3ML) 0.083% nebulizer solution Commonly known as: PROVENTIL  Take 3 mLs (2.5 mg total) by nebulization every 6 (six) hours as needed for wheezing or shortness of breath.   ascorbic acid 500 MG tablet Commonly known as: VITAMIN C Take 500 mg by mouth daily.   atorvastatin  80 MG tablet Commonly known as: LIPITOR  Take 1 tablet (80 mg total) by mouth once  daily in the evening.  Please follow up in 2-3 months. (Take 1 tablet (80 mg total) by mouth every evening.)   buPROPion  150 MG 24 hr tablet Commonly known as: Wellbutrin  XL Take 3 tablets by mouth once daily. (Take 3 tablets (450 mg total) by mouth daily.)   Coricidin D Cold/Flu/Sinus 2-5-325 MG Tabs Generic drug: Chlorphen-Phenyleph-APAP Take 1 tablet by mouth as needed.   docusate sodium  100 MG capsule Commonly known as: COLACE Take 1 capsule (100 mg total) by mouth daily as needed for mild constipation.   doxycycline  100 MG tablet Commonly known as: VIBRA -TABS Take 1 tablet (100 mg total) by mouth 2 (two) times daily.   Eliquis  5 MG Tabs tablet Generic drug: apixaban  Take 1 tablet (5 mg total) by mouth 2 (two) times daily.   escitalopram  20 MG tablet Commonly known as: LEXAPRO  Take 20 mg by mouth in the morning.   ezetimibe  10 MG tablet Commonly known as: ZETIA  Take 1 tablet (10 mg total) by mouth daily.   FeroSul 325 (65 Fe) MG tablet Generic drug: ferrous sulfate  Take 1 tablet (325 mg total) by mouth 3 (three) times a week.   furosemide  40 MG tablet Commonly known as: LASIX  Take 1 tablet (40mg ) daily. May take 1/2 tablet (20mg ) additional if shortness of breath or swelling occurs.   HYDROcodone -acetaminophen  5-325 MG tablet Commonly known as: NORCO/VICODIN Take 1-2 tablets by mouth 2 (two) times daily as needed for moderate pain (pain score 4-6) or severe pain (pain score 7-10).   hydrOXYzine  25 MG tablet Commonly known as: ATARAX  Take 1 tablet (25 mg total) by mouth at bedtime and may repeat dose one time if needed for sleep.   Jardiance  10 MG Tabs tablet Generic drug: empagliflozin  Take 10 mg by mouth daily. Through [employer] PAP   metFORMIN  500 MG tablet Commonly known as: GLUCOPHAGE  Take 1 tablet (500 mg total) by mouth 2 (two) times daily before a meal.   metoprolol  tartrate 100 MG tablet Commonly known as: LOPRESSOR  Take 1 tablet (100 mg total) by  mouth 2 (two) times daily.   nicotine  21 mg/24hr patch Commonly known as: NICODERM CQ  - dosed in mg/24 hours For Weeks 1-2. Place patch onto skin and wear for 24 hours. If you have sleep disturbances,  remove at bedtime.   nicotine  14 mg/24hr patch Commonly known as: NICODERM CQ  - dosed in mg/24 hours For Weeks 3-4. Place patch onto skin and wear for 24 hours. If you have sleep disturbances, remove at bedtime.   nicotine  7 mg/24hr patch Commonly known as: NICODERM CQ  - dosed in mg/24 hr For Weeks 5-8. Place patch onto skin and wear for 24 hours. If you have sleep disturbances, remove at bedtime.   nitroGLYCERIN  0.4 MG SL tablet Commonly known as: NITROSTAT  Place 1 tablet (0.4 mg total) under the tongue every 5 (five) minutes as needed.   pantoprazole  40 MG tablet Commonly known as: PROTONIX  Take 1 tablet (40 mg total) by mouth daily before breakfast.   PEG 3350  17 GM/SCOOP Powd Take one capful (17g) twice daily until soft stool, then continue one capful once daily as needed. What changed:  how much to take how to take this when to take this reasons to take this additional instructions   potassium chloride  SA 20 MEQ tablet Commonly known as: KLOR-CON  M Take 1 tablet (20 mEq total) by mouth daily.   rOPINIRole  3 MG tablet Commonly known as: REQUIP  Take 1 tablet (3 mg total) by mouth at bedtime, AND 0.5 tablets (1.5 mg total) during the day for breakthrough symptoms.   sildenafil  100 MG tablet Commonly known as: VIAGRA  Take 1 tablet (100 mg total) by mouth as needed for erectile dysfunction. Do not take nitroglycerine within 24 hours of last dose.   tiZANidine  4 MG tablet Commonly known as: Zanaflex  Take 1 tablet (4 mg total) by mouth 2 (two) times daily as needed for muscle spasms.   traZODone  150 MG tablet Commonly known as: DESYREL  Take 1 tablet (150 mg total) by mouth at bedtime.   Vitamin D  (Ergocalciferol ) 1.25 MG (50000 UNIT) Caps capsule Commonly known as:  DRISDOL  Take 1 capsule (50,000 Units total) by mouth every 7 (seven) days.          REVIEW OF SYSTEMS: A comprehensive ROS was conducted with the patient and is negative except as per HPI    OBJECTIVE:  VS: BP 120/76 (BP Location: Left Arm, Patient Position: Sitting, Cuff Size: Normal)   Pulse 88   Ht 5' 8.5 (1.74 m)   Wt 212 lb (96.2 kg)   SpO2 97%   BMI 31.77 kg/m    Wt Readings from Last 3 Encounters:  03/25/24 212 lb (96.2 kg)  02/08/24 213 lb 12.8 oz (97 kg)  12/14/23 215 lb (97.5 kg)     EXAM: General: Pt appears well and is in NAD  Neck: General: Supple without adenopathy. Thyroid : Thyroid  size normal.  No goiter or nodules appreciated.   Lungs: Clear with good BS bilat   Heart: Auscultation: RRR.  Abdomen: Soft, nontender  Extremities:  BL LE: No pretibial edema   Mental Status: Judgment, insight: Intact Orientation: Oriented to time, place, and person Mood and affect: No depression, anxiety, or agitation     DATA REVIEWED: *****   Latest Reference Range & Units 03/31/23 11:32  TSH 0.35 - 5.50 uIU/mL 0.84  T4,Free(Direct) 0.60 - 1.60 ng/dL 9.06  Thyrotropin Receptor Ab 0.00 - 1.75 IU/L <1.10      Latest Reference Range & Units 09/15/23 09:00  WBC 3.4 - 10.8 x10E3/uL 7.2  RBC 4.14 - 5.80 x10E6/uL 5.41  Hemoglobin 13.0 - 17.7 g/dL 85.1  HCT 62.4 - 48.9 % 46.7  MCV 79 - 97 fL 86  MCH 26.6 - 33.0 pg 27.4  MCHC 31.5 - 35.7 g/dL 68.2  RDW 88.3 - 84.5 % 14.1  Platelets 150 - 450 x10E3/uL 190      Latest Reference Range & Units 09/15/23 09:00  Sodium 134 - 144 mmol/L 140  Potassium 3.5 - 5.2 mmol/L 4.3  Chloride 96 - 106 mmol/L 101  CO2 20 - 29 mmol/L 19 (L)  Glucose 70 - 99 mg/dL 838 (H)  BUN 8 - 27 mg/dL 17  Creatinine 9.23 - 8.72 mg/dL 9.04  Calcium  8.6 - 10.2 mg/dL 9.3  BUN/Creatinine Ratio 10 - 24  18  eGFR >59 mL/min/1.73 88     01/16/2023 Thyrotropin Receptor Ab <1.10    Old records , labs and images have been reviewed.    ASSESSMENT/PLAN/RECOMMENDATIONS:   Subclinical Hyperthyroidism:  -Discussed differential diagnosis to include Graves' disease, autonomous thyroid  nodule -TRAb negative - No local neck symptoms  -We discussed the importance of euthyroidism in the setting of cardiac arrhythmia -TFTs *  Medication  Methimazole  5 mg, 1 tab Monday, Wednesday , Friday and Saturday  2.  Essential tremors  - These have been bothersome to the patient and interfering with hand work -A referral to neurology has been placed     F/U in 6 months   Signed electronically by: Stefano Redgie Butts, MD  [facility] Endocrinology  [facility] [address] [place] [place], [place] [address] Phone: [phone] FAX: [phone]   CC: Oris Camie BRAVO, NP [address] [place] [address] Phone: [phone] Fax: [phone]   Return to Endocrinology clinic as below: Future Appointments  Date Time Provider Department Center  03/28/2024  9:00 AM PFM-PHARMACIST [facility]  04/12/2024  8:00 AM [facility] PAT 1 [facility] None  05/02/2024  1:15 PM Vernetta Lonni GRADE, MD [facility] None  05/14/2024  9:30 AM PFM-ANNUAL WELLNESS VISIT [facility]  12/24/2024  8:30 AM Early, Camie BRAVO, NP [facility]

## 2024-03-26 ENCOUNTER — Other Ambulatory Visit: Payer: Self-pay | Admitting: Physician Assistant

## 2024-03-26 ENCOUNTER — Ambulatory Visit: Payer: Self-pay | Admitting: Internal Medicine

## 2024-03-26 DIAGNOSIS — Z01818 Encounter for other preprocedural examination: Secondary | ICD-10-CM

## 2024-03-28 ENCOUNTER — Encounter: Payer: Self-pay | Admitting: Neurology

## 2024-03-28 ENCOUNTER — Other Ambulatory Visit (INDEPENDENT_AMBULATORY_CARE_PROVIDER_SITE_OTHER)

## 2024-03-28 DIAGNOSIS — F17210 Nicotine dependence, cigarettes, uncomplicated: Secondary | ICD-10-CM

## 2024-03-28 NOTE — Progress Notes (Signed)
 03/28/2024 Name: Shaun [name] MRN: [id number] DOB: 1955-12-12  Chief Complaint  Patient presents with   Medication Management    Shaun [name] is a 68 y.o. year old male who presented for a telephone visit.   They were referred to the pharmacist by their PCP for assistance in managing diabetes and medication access.    Subjective:  Care Team: Primary Care Provider: Oris Camie BRAVO, NP ; Next Scheduled Visit: 12/24/24  Medication Access/Adherence  Current Pharmacy:  [facility] [address] [place] [address] Phone: [phone] Fax: [phone]   Patient reports affordability concerns with their medications: No Patient reports access/transportation concerns to their pharmacy: No  Patient reports adherence concerns with their medications:  No       Tobacco Abuse:  Tobacco Use History: Number of cigarettes per day 10-12 prior to recent COPD exacerbation Last cigarette was 3 days ago Wearing the Nicotine  7 mg patch and reports this is helping with the cravings tremendously  Denies any issues or concerns with using the patch  Has not pursued use of Nicotine  gum or lozenge for breakthrough cravings yet, has been using sugar free cinnamon candy as a distraction in place of cigarette  Objective:  Lab Results  Component Value Date   HGBA1C 7.5 (H) 03/14/2024    Lab Results  Component Value Date   CREATININE 0.85 12/07/2023   BUN 13 12/07/2023   NA 135 12/07/2023   K 4.6 12/07/2023   CL 101 12/07/2023   CO2 18 (L) 12/07/2023    Lab Results  Component Value Date   CHOL 131 12/07/2023   HDL 30 (L) 12/07/2023   LDLCALC 66 12/07/2023   LDLDIRECT 83.0 04/19/2022   TRIG 213 (H) 12/07/2023   CHOLHDL 4.4 12/07/2023    Medications Reviewed Today     Reviewed by Shaun [name], RPH (Pharmacist) on 03/28/24 at [phone]  Med List Status: <None>   Medication Order Taking? Sig Documenting Provider  Last Dose Status Informant  albuterol  (PROVENTIL ) (2.5 MG/3ML) 0.083% nebulizer solution [id number]  Take 3 mLs (2.5 mg total) by nebulization every 6 (six) hours as needed for wheezing or shortness of breath. Dreama Longs, MD  Active Self           Med Note SOILA, BROOKE C   Tue Dec 12, 2023 10:12 AM)    albuterol  (VENTOLIN  HFA) 108 [phone] Base) MCG/ACT inhaler [id number]  INHALE 2 PUFFS INTO THE LUNGS EVERY 6 HOURS AS NEEDED FOR WHEEZING OR SHORTNESS OF BREATH Tysinger, Alm RAMAN, PA-C  Active Self           Med Note SOILA, BROOKE C   Tue Dec 12, 2023 10:12 AM)    apixaban  (ELIQUIS ) 5 MG TABS tablet [id number] Yes Take 1 tablet (5 mg total) by mouth 2 (two) times daily. Pietro Redell RAMAN, MD  Active Self  ascorbic acid (VITAMIN C) 500 MG tablet [id number]  Take 500 mg by mouth daily. [provider]  Active Self  atorvastatin  (LIPITOR ) 80 MG tablet [id number]  Take 1 tablet (80 mg total) by mouth every evening. Meng, Hao, [place]  Active   buPROPion  (WELLBUTRIN  XL) 150 MG 24 hr tablet [id number] Yes Take 3 tablets (450 mg total) by mouth daily. Early, Sara E, NP  Active Self  Chlorphen-Phenyleph-APAP (CORICIDIN D COLD/FLU/SINUS) 2-5-325 MG TABS [id number]  Take 1 tablet by mouth as needed. [provider]  Active   docusate sodium  (COLACE) 100 MG capsule [id number]  Yes Take 1 capsule (100 mg total) by mouth daily as needed for mild constipation. Early, Sara E, NP  Active Self  doxycycline  (VIBRA -TABS) 100 MG tablet [id number]  Take 1 tablet (100 mg total) by mouth 2 (two) times daily. Randol Dawes, MD  Active   empagliflozin  (JARDIANCE ) 10 MG TABS tablet [id number] Yes Take 10 mg by mouth daily. Through [employer] PAP [provider]  Active Self  escitalopram  (LEXAPRO ) 20 MG tablet [id number] Yes Take 20 mg by mouth in the morning. [provider]  Active Self  ezetimibe  (ZETIA ) 10 MG tablet [id number] Yes Take 1 tablet (10 mg total) by mouth daily. Pietro Redell RAMAN, MD  Active  Self  ferrous sulfate  (FEROSUL) 325 (65 FE) MG tablet [id number] Yes Take 1 tablet (325 mg total) by mouth 3 (three) times a week. Early, Sara E, NP  Active   furosemide  (LASIX ) 40 MG tablet [id number] Yes Take 1 tablet (40mg ) daily. May take 1/2 tablet (20mg ) additional if shortness of breath or swelling occurs. Early, Sara E, NP  Active   HYDROcodone -acetaminophen  (NORCO/VICODIN) 5-325 MG tablet [id number]  Take 1-2 tablets by mouth 2 (two) times daily as needed for moderate pain (pain score 4-6) or severe pain (pain score 7-10). Early, Sara E, NP  Active   hydrOXYzine  (ATARAX ) 25 MG tablet [id number]  Take 1 tablet (25 mg total) by mouth at bedtime and may repeat dose one time if needed for sleep. Early, Sara E, NP  Active Self  lidocaine  (XYLOCAINE ) 1 % (with pres) injection 5 mL [id number]   Carilyn Prentice BRAVO, MD  Active   metFORMIN  (GLUCOPHAGE ) 500 MG tablet [id number] Yes Take 1 tablet (500 mg total) by mouth 2 (two) times daily before a meal. Early, Camie BRAVO, NP  Active   methimazole  (TAPAZOLE ) 5 MG tablet [id number] Yes Take 1 tablet (5 mg total) by mouth as directed. 1 tablet 4 days a week [name], Ibtehal Jaralla, MD  Active   metoprolol  tartrate (LOPRESSOR ) 100 MG tablet [id number] Yes Take 1 tablet (100 mg total) by mouth 2 (two) times daily. Janene Boer, [place]  Active Self   Patient not taking:   Discontinued 03/28/24 0913 (Completed Course)          Med Note SOILA, BROOKE C   Tue Dec 12, 2023 10:19 AM) Has not started yet    Discontinued 03/28/24 0913 (Completed Course)          Med Note SOILA, BROOKE C   Tue Dec 12, 2023 10:19 AM) Has not started yet  nicotine  (NICODERM CQ  - DOSED IN MG/24 HR) 7 mg/24hr patch [id number] Yes For Weeks 5-8. Place patch onto skin and wear for 24 hours. If you have sleep disturbances, remove at bedtime. Early, Sara E, NP  Active Self           Med Note JANEAN, Alexys Gassett H   Thu Mar 28, 2024  9:13 AM)    nitroGLYCERIN  (NITROSTAT ) 0.4 MG SL tablet [id number] Yes  Place 1 tablet (0.4 mg total) under the tongue every 5 (five) minutes as needed. Early, Sara E, NP  Active Self           Med Note SOILA, BROOKE C   Tue Dec 12, 2023 10:16 AM)    pantoprazole  (PROTONIX ) 40 MG tablet [id number] Yes Take 1 tablet (40 mg total) by mouth daily before breakfast. Ezzard Sonny RAMAN, PA-C  Active Self  polyethylene glycol powder (GLYCOLAX /MIRALAX ) 17 GM/SCOOP powder [id number] Yes Take one capful (  17g) twice daily until soft stool, then continue one capful once daily as needed. Ezzard Sonny RAMAN, PA-C  Active Self  potassium chloride  SA (KLOR-CON  M) 20 MEQ tablet [id number] Yes Take 1 tablet (20 mEq total) by mouth daily. Pietro Redell RAMAN, MD  Active Self  rOPINIRole  (REQUIP ) 3 MG tablet [id number] Yes Take 1 tablet (3 mg total) by mouth at bedtime, AND 0.5 tablets (1.5 mg total) during the day for breakthrough symptoms. Early, Sara E, NP  Active   sildenafil  (VIAGRA ) 100 MG tablet [id number] Yes Take 1 tablet (100 mg total) by mouth as needed for erectile dysfunction. Do not take nitroglycerine within 24 hours of last dose. Early, Sara E, NP  Active   tiZANidine  (ZANAFLEX ) 4 MG tablet [id number] Yes Take 1 tablet (4 mg total) by mouth 2 (two) times daily as needed for muscle spasms. Joyce Norleen BROCKS, MD  Active   traZODone  (DESYREL ) 150 MG tablet [id number] Yes Take 1 tablet (150 mg total) by mouth at bedtime. Early, Sara E, NP  Active Self  Vitamin D , Ergocalciferol , (DRISDOL ) 1.25 MG (50000 UNIT) CAPS capsule [id number] Yes Take 1 capsule (50,000 Units total) by mouth every 7 (seven) days. Early, Sara E, NP  Active Self              Assessment/Plan:    Tobacco Abuse - Currently controlled - Provided motivational interviewing to assess tobacco use and strategies for reduction - Provided information on 1 800 QUIT NOW support program, patient is going to reach out and request lozenges via the program to assist with breakthrough cravings whilst wearing the patch -Reach out  sooner than scheduled follow up if you get the strong urge to start smoking agian     Follow Up Plan: 6 weeks  Jon VEAR Lindau, PharmD Clinical Pharmacist [phone]

## 2024-04-04 ENCOUNTER — Other Ambulatory Visit: Payer: Self-pay | Admitting: Nurse Practitioner

## 2024-04-04 DIAGNOSIS — G479 Sleep disorder, unspecified: Secondary | ICD-10-CM

## 2024-04-04 NOTE — Progress Notes (Signed)
 Assessment/Plan:    Tremor -likely mostly longstanding ET.  Does have a rest component now.  This is likely from longstanding essential tremor, but given gait instability and family history of Parkinson's, we decided to proceed with DaTscan.  - Hyperthyroidism may be contributing, but generally numbers look good and well-controlled with methimazole .  - Discussed with patient that his options for oral meds really are quite limited.  He is already on a beta-blocker (metoprolol ).  The any other first-line drug is primidone, and that interacts with the Eliquis , so he cannot be on that.  Topamax is considered first-line medication, but only in high dosages, which are often not tolerated in this age group.  I don't think that gabapentin will likely make a difference with this degree of tremor.  - Discussed surgical options in detail, including focused ultrasound and DBS.  He was shown HIPAA compliant videos of pt with ET who had DBS.  He is not sure if he would like to proceed with that, but he is going to think about that.  He and I talked about the fact that he would need to stop the tobacco prior to any type of DBS surgery.  He is also on Eliquis , which is a complicating factor but not contraindicated.  - Discussed Cala Trio and information given on that.  -decreasing caffeine  may be of value  2.  Tobacco abuse  - Discussed the importance of tobacco cessation.  3.  Mood d/o  - Patient is on Wellbutrin  and will need to come off of that for 8 days prior to DaT scan.  Usually, this can just be held, but given his dose, I am going to ask his prescribing provider how she would like to handle this, as I do not think he probably can stop this cold [place] for 8 days.  4.  Hip pain  - Patient is getting ready to undergo right total hip arthroplasty in 2 weeks, so we will hold off on DaT scan until at least mid to late next month.  Subjective:   Shaun [name] was seen today in the movement disorders  clinic for neurologic consultation at the request of [name], [name].  The consultation is for the evaluation of tremor.  This patient is accompanied in the office by his spouse who supplements the history. Patient has been seen by [facility] neurology, Dr. Buck, for the same back in 2022.  At that point in time, tremor had been going on for about 4 to 5 years.  He was on Abilify  when he saw Dr. Buck.  However, she really did not notice any parkinsonism.  No medication was recommended at that time, in part because of interactions with his other medications.  More recently, the patient saw his endocrinologist.  He sees her for subclinical hyperthyroidism.  He is on methimazole .  Tremor: Yes.     How long has it been going on? 8 years  At rest or with activation?  Activation mostly but now with rest as well.  Both hands shake equally  Fam hx of tremor?  Yes.   Brother has Parkinson's disease  Located where?  Bilateral upper extremity and jaw (he admits he moves the legs but that is b/c of RLS)  Affected by caffeine : unknown (2 cups coffee; 2-3 pepsi per day)  Affected by alcohol:  unknown  Affected by stress:  Yes.    Affected by fatigue:  Yes.    Spills soup if on spoon:  probably would spill  Spills glass of liquid if full:  No. But often with 2 hands  Affects ADL's (tying shoes, brushing teeth, etc):  Yes.   (Shaves with electric many of times and 1 time per month with blade)  Tremor inducing meds:  patient has been on Abilify  in the past but not now  Other Specific Symptoms:  Voice: not changing; no tremor Postural symptoms:  No.  Falls?  No. Bradykinesia symptoms: some shuffling per wife Loss of smell:  No. Loss of taste:  No. Urinary Incontinence:  some trouble when on lasix  Difficulty Swallowing:  occasionally and any consistency of food (he has had esophagus stretched years ago and it helped) Handwriting, micrographia: No. But its illegible due to tremor Depression:   occasionally but mostly anxious Memory changes:  occasional trouble (able to remember own pills; wife has always done finances; he drives without trouble) N/V:  nausea 1-2 times per week Lightheaded:  occasional dizzy  Syncope: no Diplopia:  No. Dyskinesia:  No.  ALLERGIES:  No Known Allergies  CURRENT MEDICATIONS:  Current Outpatient Medications  Medication Instructions   albuterol  (PROVENTIL ) 2.5 mg, Nebulization, Every 6 hours PRN   albuterol  (VENTOLIN  HFA) 108 (90 Base) MCG/ACT inhaler INHALE 2 PUFFS INTO THE LUNGS EVERY 6 HOURS AS NEEDED FOR WHEEZING OR SHORTNESS OF BREATH   ascorbic acid (VITAMIN C) 500 mg, Daily   atorvastatin  (LIPITOR ) 80 mg, Oral, Every evening   buPROPion  (WELLBUTRIN  XL) 450 mg, Oral, Daily   Chlorphen-Phenyleph-APAP (CORICIDIN D COLD/FLU/SINUS) 2-5-325 MG TABS 1 tablet, As needed   docusate sodium  (COLACE) 100 mg, Oral, Daily PRN   doxycycline  (VIBRA -TABS) 100 mg, Oral, 2 times daily   Eliquis  5 mg, Oral, 2 times daily   empagliflozin  (JARDIANCE ) 10 mg, Daily   escitalopram  (LEXAPRO ) 20 mg, Every morning   ezetimibe  (ZETIA ) 10 mg, Oral, Daily   FeroSul 325 mg, Oral, 3 times weekly   furosemide  (LASIX ) 40 MG tablet Take 1 tablet (40mg ) daily. May take 1/2 tablet (20mg ) additional if shortness of breath or swelling occurs.   HYDROcodone -acetaminophen  (NORCO/VICODIN) 5-325 MG tablet 1-2 tablets, Oral, 2 times daily PRN   hydrOXYzine  (ATARAX ) 25 MG tablet Take 1 tablet (25 mg total) by mouth at bedtime and may repeat dose one time if needed for sleep.   metFORMIN  (GLUCOPHAGE ) 500 mg, Oral, 2 times daily before meals   methimazole  (TAPAZOLE ) 5 mg, Oral, As directed, 1 tablet 4 days a week   metoprolol  tartrate (LOPRESSOR ) 100 mg, Oral, 2 times daily   nicotine  (NICODERM CQ  - DOSED IN MG/24 HR) 7 mg/24hr patch For Weeks 5-8. Place patch onto skin and wear for 24 hours. If you have sleep disturbances, remove at bedtime.   nitroGLYCERIN  (NITROSTAT ) 0.4 mg,  Sublingual, Every 5 min PRN   pantoprazole  (PROTONIX ) 40 mg, Oral, Daily before breakfast   polyethylene glycol powder (GLYCOLAX /MIRALAX ) 17 GM/SCOOP powder Take one capful (17g) twice daily until soft stool, then continue one capful once daily as needed.   potassium chloride  SA (KLOR-CON  M) 20 MEQ tablet 20 mEq, Oral, Daily   rOPINIRole  (REQUIP ) 3 MG tablet Take 1 tablet (3 mg total) by mouth at bedtime, AND 0.5 tablets (1.5 mg total) during the day for breakthrough symptoms.   sildenafil  (VIAGRA ) 100 mg, Oral, As needed, Do not take nitroglycerine within 24 hours of last dose.   tiZANidine  (ZANAFLEX ) 4 mg, Oral, 2 times daily PRN   traZODone  (DESYREL ) 150 mg, Oral, Daily at bedtime   Vitamin  D (Ergocalciferol ) (DRISDOL ) 50,000 Units, Oral, Every 7 days    Objective:   PHYSICAL EXAMINATION:    VITALS:   Vitals:   04/08/24 1032  BP: 139/86  Pulse: 97  SpO2: 96%  Weight: 214 lb 6.4 oz (97.3 kg)  Height: 5' 8 (1.727 m)    GEN:  The patient appears stated age and is in NAD. HEENT:  Normocephalic, atraumatic.  The mucous membranes are moist. The superficial temporal arteries are without ropiness or tenderness. CV:  irreg irreg Lungs:  CTAB Neck/HEME:  There are no carotid bruits bilaterally.  Neurological examination:  Orientation: The patient is alert and oriented x3.  Cranial nerves: There is good facial symmetry.  Extraocular muscles are intact. The visual fields are full to confrontational testing. The speech is fluent and clear. Soft palate rises symmetrically and there is no tongue deviation. Hearing is intact to conversational tone. Sensation: Sensation is intact to light touch throughout (facial, trunk, extremities). Vibration is intact at the bilateral big toe. There is no extinction with double simultaneous stimulation.  Motor: Strength is 5/5 in the bilateral upper and lower extremities.   Shoulder shrug is equal and symmetric.  There is no pronator drift. Deep tendon  reflexes: Deep tendon reflexes are 0-1/4 at the bilateral biceps, triceps, brachioradialis, patella and achilles. Plantar responses are downgoing bilaterally.  Movement examination: Tone: There is ?mild increased tone in the RU Abnormal movements: there is jaw tremor.  There is mild LUE rest tremor.  There is mild postural tremor on the R.  There is mod intention tremor R>L.  He has trouble with Archimedes spirals bilaterally.  He has trouble pouring water  from 1 glass to another, but actually was able to spill very little because he was fairly careful.     Coordination:  There is no decremation with RAM's, with any form of RAMS, including alternating supination and pronation of the forearm, hand opening and closing, finger taps, heel taps and toe taps.  Gait and Station: The patient pushes off to arise.  His gait is antalgic.  He does not shuffle.   I have reviewed and interpreted the following labs independently   Chemistry      Component Value Date/Time   NA 135 12/07/2023 0821   K 4.6 12/07/2023 0821   CL 101 12/07/2023 0821   CO2 18 (L) 12/07/2023 0821   BUN 13 12/07/2023 0821   CREATININE 0.85 12/07/2023 0821   CREATININE 0.92 12/15/2017 0744      Component Value Date/Time   CALCIUM  9.1 12/07/2023 0821   ALKPHOS 85 12/07/2023 0821   AST 19 12/07/2023 0821   ALT 24 12/07/2023 0821   BILITOT 0.5 12/07/2023 0821      Lab Results  Component Value Date   TSH 0.48 03/25/2024   Lab Results  Component Value Date   WBC 5.6 12/07/2023   HGB 14.6 12/07/2023   HCT 43.9 12/07/2023   MCV 85 12/07/2023   PLT 154 12/07/2023      Total time spent on today's visit was 70 minutes, including both face-to-face time and nonface-to-face time.  Time included that spent on review of records (prior notes available to me/labs/imaging if pertinent), discussing treatment and goals, answering patient's questions and coordinating care.  Cc:  Early, Sara E, NP

## 2024-04-08 ENCOUNTER — Other Ambulatory Visit (HOSPITAL_BASED_OUTPATIENT_CLINIC_OR_DEPARTMENT_OTHER): Payer: Self-pay

## 2024-04-08 ENCOUNTER — Ambulatory Visit: Admitting: Neurology

## 2024-04-08 ENCOUNTER — Encounter: Payer: Self-pay | Admitting: Neurology

## 2024-04-08 VITALS — BP 139/86 | HR 97 | Ht 68.0 in | Wt 214.4 lb

## 2024-04-08 DIAGNOSIS — F39 Unspecified mood [affective] disorder: Secondary | ICD-10-CM

## 2024-04-08 DIAGNOSIS — R251 Tremor, unspecified: Secondary | ICD-10-CM | POA: Diagnosis not present

## 2024-04-08 DIAGNOSIS — Z720 Tobacco use: Secondary | ICD-10-CM | POA: Diagnosis not present

## 2024-04-08 MED ORDER — HYDROXYZINE HCL 25 MG PO TABS
25.0000 mg | ORAL_TABLET | Freq: Every evening | ORAL | 2 refills | Status: AC | PRN
  Filled 2024-04-08: qty 60, 30d supply, fill #0
  Filled 2024-04-24 – 2024-05-08 (×2): qty 60, 30d supply, fill #1
  Filled 2024-06-07: qty 60, 30d supply, fill #2

## 2024-04-08 NOTE — Patient Instructions (Signed)
 As we discussed, we are going to do a DaT scan.  We discussed that this is not a diagnostic scan, but will just give us  some information on dopamine levels in the brain.  Here is some information which may be helpful to you.  Before the Exam  Please tell the nurse, nuclear imaging technician or nuclear medicine physician if you are pregnant, nursing or have reduced liver function. Please also inform us  if you have an allergy or sensitivity to iodine .  The test may be completed with those who are allergic to iodine , but may require pre-medication with other medications to help avoid reactions. If you need to cancel the examination, please give us  at least 24 hours notice.  Before your scan, stop taking these medicines for the length of time shown: Name of Drug Stop Taking  Amoxapine 4 days before  Benztropine  Cogentin 3 days before  Bupropion  (Aplenzin , Budeprion, Voxra, Wellbutrin , Zyban ) 8 days before  Buspirone 15 hours before  Citalopram 24 hours before  Cocaine 6 hours before  Escitalopram  24 hours before  Methamphetamine 24 hours before  Methylphenidate (Concerta, Metadate, Methylin, Ritalin) 20 hours before  Paroxetine 24 hours before  Selegilene 48 hours before  Sertraline 3 days before    On the Day of the Exam Drink plenty of fluids and go to the bathroom frequently (and for two days after your exam) Wear loose comfortable clothing, since you will need to lie still for a period of time. Please bring a list of all medications that you are taking; name and dosage. We want to make your waiting time as pleasant as possible. Consider bringing your favorite magazine, book or music player to help you pass the time.  You do not need to stay at the imaging facility the entire time, between the initial injection and the scan itself.   Please leave your jewelry and valuables at home.  During the Exam The DaTscan once started takes approximately 30-45 minutes. However, following  injection of the DaT agent approximately 3-6 hours are required before the agent has achieved appropriate concentration in the brain.  We will inject the DaTscan through an intravenous (IV) line into your arm in the AM, usually around 8-9am, and then you will come back usually in the mid afternoon for the scan. Before the exam, you will receive a drug to allow you to protect the thyroid . For the imaging test, you will be asked to lie on a table and an imaging technologist will position your head in a headrest. A strip of tape or a flexible restraint may be placed around your head to help you to not move your head during the scan. A camera will be positioned above you and you must remain very still for about 30 minute while images are taken. The scanner will be very close to your head, but will not touch your head.

## 2024-04-11 NOTE — Progress Notes (Addendum)
 COVID Vaccine Completed: yes  Date of COVID positive in last 90 days:  PCP - Camie Doing, NP Cardiologist - Redell Shallow, MD Neurologist- Asberry Tat, DO LOV 04/08/24 saw for tremor   Cardiac clearance by Lum Louis, NP 12/04/23 in Epic  Chest x-ray - 06/17/23 Epic EKG - 09/19/23 Epic Stress Test - 07/09/15 Epic ECHO - 08/14/23 Epic Cardiac Cath - 09/19/23 Epic Pacemaker/ICD device last checked:n/a Spinal Cord Stimulator: n/a  Bowel Prep - no  Sleep Study - yes CPAP - yes every night  Fasting Blood Sugar - 110-165 Checks Blood Sugar 1 times a day  Last dose of GLP1 agonist-  N/A GLP1 instructions:  Do not take after     Last dose of SGLT-2 inhibitors-  Jardiance , hold 3 days prior to surgery SGLT-2 instructions:  Do not take after  04/15/24   Blood Thinner Instructions:  Eliquis , hold 3 days Aspirin  Instructions: Last Dose: 04/15/24 1930  Activity level: Can go up a flight of stairs and perform activities of daily living without stopping and without symptoms of chest pain or shortness of breath. Uses a cane PRN due to hip pain. Difficulty with stairs dueto hip but he is able to do them  Anesthesia review: a fib, CAD, HTN, COPD, OSA, DM2, anemia   Patient denies shortness of breath, fever, cough and chest pain at PAT appointment  Patient verbalized understanding of instructions that were given to them at the PAT appointment. Patient was also instructed that they will need to review over the PAT instructions again at home before surgery.

## 2024-04-11 NOTE — Patient Instructions (Addendum)
 SURGICAL WAITING ROOM VISITATION  Patients having surgery or a procedure may have no more than 2 support people in the waiting area - these visitors may rotate.    Children under the age of 13 must have an adult with them who is not the patient.  Visitors with respiratory illnesses are discouraged from visiting and should remain at home.  If the patient needs to stay at the hospital during part of their recovery, the visitor guidelines for inpatient rooms apply. Pre-op nurse will coordinate an appropriate time for 1 support person to accompany patient in pre-op.  This support person may not rotate.    Please refer to the [facility] website for the visitor guidelines for Inpatients (after your surgery is over and you are in a regular room).    Your procedure is scheduled on: 04/19/24   Report to [facility] Main Entrance    Report to admitting at 5:15AM   Call this number if you have problems the morning of surgery [phone]   Do not eat food :After Midnight.   After Midnight you may have the following liquids until 4:15 AM DAY OF SURGERY  Water  Non-Citrus Juices (without pulp, NO RED-Apple, White grape, White cranberry) Black Coffee (NO MILK/CREAM OR CREAMERS, sugar ok)  Clear Tea (NO MILK/CREAM OR CREAMERS, sugar ok) regular and decaf                             Plain Jell-O (NO RED)                                           Fruit ices (not with fruit pulp, NO RED)                                     Popsicles (NO RED)                                                               Sports drinks like Gatorade (NO RED)    The day of surgery:  Drink ONE (1) Pre-Surgery G2 at 4:15 AM the morning of surgery. Drink in one sitting. Do not sip.  This drink was given to you during your hospital  pre-op appointment visit. Nothing else to drink after completing the  Pre-Surgery G2.          If you have questions, please contact your surgeon's office.   FOLLOW BOWEL PREP  AND ANY ADDITIONAL PRE OP INSTRUCTIONS YOU RECEIVED FROM YOUR SURGEON'S OFFICE!!!     Oral Hygiene is also important to reduce your risk of infection.                                    Remember - BRUSH YOUR TEETH THE MORNING OF SURGERY WITH YOUR REGULAR TOOTHPASTE  DENTURES WILL BE REMOVED PRIOR TO SURGERY PLEASE DO NOT APPLY Poly grip OR ADHESIVES!!!   Do NOT smoke after Midnight   Stop all vitamins and herbal supplements  7 days before surgery.   Hold Eliquis  3 days. Do not take after 04/15/24.   Take these medicines the morning of surgery with A SIP OF WATER : Inhalers, Bupropion , Lexapro , Zetia , Norco, Methimazole , Metoprolol , Pantoprazole    DO NOT TAKE ANY ORAL DIABETIC MEDICATIONS DAY OF YOUR SURGERY  How to Manage Your Diabetes Before and After Surgery  Why is it important to control my blood sugar before and after surgery? Improving blood sugar levels before and after surgery helps healing and can limit problems. A way of improving blood sugar control is eating a healthy diet by:  Eating less sugar and carbohydrates  Increasing activity/exercise  Talking with your doctor about reaching your blood sugar goals High blood sugars (greater than 180 mg/dL) can raise your risk of infections and slow your recovery, so you will need to focus on controlling your diabetes during the weeks before surgery. Make sure that the doctor who takes care of your diabetes knows about your planned surgery including the date and location.  How do I manage my blood sugar before surgery? Check your blood sugar at least 4 times a day, starting 2 days before surgery, to make sure that the level is not too high or low. Check your blood sugar the morning of your surgery when you wake up and every 2 hours until you get to the Short Stay unit. If your blood sugar is less than 70 mg/dL, you will need to treat for low blood sugar: Do not take insulin . Treat a low blood sugar (less than 70 mg/dL) with  cup  of clear juice (cranberry or apple), 4 glucose tablets, OR glucose gel. Recheck blood sugar in 15 minutes after treatment (to make sure it is greater than 70 mg/dL). If your blood sugar is not greater than 70 mg/dL on recheck, call [phone] for further instructions. Report your blood sugar to the short stay nurse when you get to Short Stay.  If you are admitted to the hospital after surgery: Your blood sugar will be checked by the staff and you will probably be given insulin  after surgery (instead of oral diabetes medicines) to make sure you have good blood sugar levels. The goal for blood sugar control after surgery is 80-180 mg/dL.   WHAT DO I DO ABOUT MY DIABETES MEDICATION?  Do not take oral diabetes medicines (pills) the morning of surgery.  Hold Jardiance  for three days prior to surgery. Do not take  after 04/15/24.  THE DAY BEFORE SURGERY, take Metformin  as prescribed.      THE MORNING OF SURGERY, do not take Metformin .   Reviewed and Endorsed by [facility] Patient Education Committee, August 2015  Bring CPAP mask and tubing day of surgery.                              You may not have any metal on your body including jewelry, and body piercing             Do not wear lotions, powders, cologne, or deodorant              Men may shave face and neck.   Do not bring valuables to the hospital. [facility] IS NOT             RESPONSIBLE   FOR VALUABLES.   Contacts, glasses, dentures or bridgework may not be worn into surgery.   Bring small overnight bag day of surgery.  DO NOT BRING YOUR HOME MEDICATIONS TO THE HOSPITAL. PHARMACY WILL DISPENSE MEDICATIONS LISTED ON YOUR MEDICATION LIST TO YOU DURING YOUR ADMISSION IN THE HOSPITAL!              Please read over the following fact sheets you were given: IF YOU HAVE QUESTIONS ABOUT YOUR PRE-OP INSTRUCTIONS PLEASE CALL [phone].   If you received a COVID test during your pre-op visit  it is requested that you  wear a mask when out in public, stay away from anyone that may not be feeling well and notify your surgeon if you develop symptoms. If you test positive for Covid or have been in contact with anyone that has tested positive in the last 10 days please notify you surgeon.      Pre-operative 5 CHG Bath Instructions   You can play a key role in reducing the risk of infection after surgery. Your skin needs to be as free of germs as possible. You can reduce the number of germs on your skin by washing with CHG (chlorhexidine  gluconate) soap before surgery. CHG is an antiseptic soap that kills germs and continues to kill germs even after washing.   DO NOT use if you have an allergy to chlorhexidine /CHG or antibacterial soaps. If your skin becomes reddened or irritated, stop using the CHG and notify one of our RNs at [phone].   Please shower with the CHG soap starting 4 days before surgery using the following schedule:     Please keep in mind the following:  DO NOT shave, including legs and underarms, starting the day of your first shower.   You may shave your face at any point before/day of surgery.  Place clean sheets on your bed the day you start using CHG soap. Use a clean washcloth (not used since being washed) for each shower. DO NOT sleep with pets once you start using the CHG.   CHG Shower Instructions:  If you choose to wash your hair and private area, wash first with your normal shampoo/soap.  After you use shampoo/soap, rinse your hair and body thoroughly to remove shampoo/soap residue.  Turn the water  OFF and apply about 3 tablespoons (45 ml) of CHG soap to a CLEAN washcloth.  Apply CHG soap ONLY FROM YOUR NECK DOWN TO YOUR TOES (washing for 3-5 minutes)  DO NOT use CHG soap on face, private areas, open wounds, or sores.  Pay special attention to the area where your surgery is being performed.  If you are having back surgery, having someone wash your back for you may be  helpful. Wait 2 minutes after CHG soap is applied, then you may rinse off the CHG soap.  Pat dry with a clean towel  Put on clean clothes/pajamas   If you choose to wear lotion, please use ONLY the CHG-compatible lotions on the back of this paper.     Additional instructions for the day of surgery: DO NOT APPLY any lotions, deodorants, cologne, or perfumes.   Put on clean/comfortable clothes.  Brush your teeth.  Ask your nurse before applying any prescription medications to the skin.      CHG Compatible Lotions   Aveeno Moisturizing lotion  Cetaphil Moisturizing Cream  Cetaphil Moisturizing Lotion  Clairol Herbal Essence Moisturizing Lotion, Dry Skin  Clairol Herbal Essence Moisturizing Lotion, Extra Dry Skin  Clairol Herbal Essence Moisturizing Lotion, Normal Skin  Curel Age Defying Therapeutic Moisturizing Lotion with Alpha Hydroxy  Curel Extreme Care Body Lotion  Curel  Soothing Hands Moisturizing Hand Lotion  Curel Therapeutic Moisturizing Cream, Fragrance-Free  Curel Therapeutic Moisturizing Lotion, Fragrance-Free  Curel Therapeutic Moisturizing Lotion, Original Formula  Eucerin Daily Replenishing Lotion  Eucerin Dry Skin Therapy Plus Alpha Hydroxy Crme  Eucerin Dry Skin Therapy Plus Alpha Hydroxy Lotion  Eucerin Original Crme  Eucerin Original Lotion  Eucerin Plus Crme Eucerin Plus Lotion  Eucerin TriLipid Replenishing Lotion  Keri Anti-Bacterial Hand Lotion  Keri Deep Conditioning Original Lotion Dry Skin Formula Softly Scented  Keri Deep Conditioning Original Lotion, Fragrance Free Sensitive Skin Formula  Keri Lotion Fast Absorbing Fragrance Free Sensitive Skin Formula  Keri Lotion Fast Absorbing Softly Scented Dry Skin Formula  Keri Original Lotion  Keri Skin Renewal Lotion Keri Silky Smooth Lotion  Keri Silky Smooth Sensitive Skin Lotion  Nivea Body Creamy Conditioning Oil  Nivea Body Extra Enriched Lotion  Nivea Body Original Lotion  Nivea Body Sheer  Moisturizing Lotion Nivea Crme  Nivea Skin Firming Lotion  NutraDerm 30 Skin Lotion  NutraDerm Skin Lotion  NutraDerm Therapeutic Skin Cream  NutraDerm Therapeutic Skin Lotion  ProShield Protective Hand Cream  Provon moisturizing lotion   View Pre-Surgery Education Videos:  [email or web address]        Incentive Spirometer  An incentive spirometer is a tool that can help keep your lungs clear and active. This tool measures how well you are filling your lungs with each breath. Taking long deep breaths may help reverse or decrease the chance of developing breathing (pulmonary) problems (especially infection) following: A long period of time when you are unable to move or be active. BEFORE THE PROCEDURE  If the spirometer includes an indicator to show your best effort, your nurse or respiratory therapist will set it to a desired goal. If possible, sit up straight or lean slightly forward. Try not to slouch. Hold the incentive spirometer in an upright position. INSTRUCTIONS FOR USE  Sit on the edge of your bed if possible, or sit up as far as you can in bed or on a chair. Hold the incentive spirometer in an upright position. Breathe out normally. Place the mouthpiece in your mouth and seal your lips tightly around it. Breathe in slowly and as deeply as possible, raising the piston or the ball toward the top of the column. Hold your breath for 3-5 seconds or for as long as possible. Allow the piston or ball to fall to the bottom of the column. Remove the mouthpiece from your mouth and breathe out normally. Rest for a few seconds and repeat Steps 1 through 7 at least 10 times every 1-2 hours when you are awake. Take your time and take a few normal breaths between deep breaths. The spirometer may include an indicator to show your best effort. Use the indicator as a goal to work toward during each repetition. After each set of 10  deep breaths, practice coughing to be sure your lungs are clear. If you have an incision (the cut made at the time of surgery), support your incision when coughing by placing a pillow or rolled up towels firmly against it. Once you are able to get out of bed, walk around indoors and cough well. You may stop using the incentive spirometer when instructed by your caregiver.  RISKS AND COMPLICATIONS Take your time so you do not get dizzy or light-headed. If you are in pain, you may need to take or ask for pain medication before doing incentive spirometry. It is harder to take a deep breath if you  are having pain. AFTER USE Rest and breathe slowly and easily. It can be helpful to keep track of a log of your progress. Your caregiver can provide you with a simple table to help with this. If you are using the spirometer at home, follow these instructions: SEEK MEDICAL CARE IF:  You are having difficultly using the spirometer. You have trouble using the spirometer as often as instructed. Your pain medication is not giving enough relief while using the spirometer. You develop fever of 100.5 F (38.1 C) or higher. SEEK IMMEDIATE MEDICAL CARE IF:  You cough up bloody sputum that had not been present before. You develop fever of 102 F (38.9 C) or greater. You develop worsening pain at or near the incision site. MAKE SURE YOU:  Understand these instructions. Will watch your condition. Will get help right away if you are not doing well or get worse. Document Released: 12/26/2006 Document Revised: 11/07/2011 Document Reviewed: 02/26/2007 Exit[employer] Patient Information 2014 [employer], [place].   ________________________________________________________________________ WHAT IS A BLOOD TRANSFUSION? Blood Transfusion Information  A transfusion is the replacement of blood or some of its parts. Blood is made up of multiple cells which provide different functions. Red blood cells carry oxygen and are used for  blood loss replacement. White blood cells fight against infection. Platelets control bleeding. Plasma helps clot blood. Other blood products are available for specialized needs, such as hemophilia or other clotting disorders. BEFORE THE TRANSFUSION  Who gives blood for transfusions?  Healthy volunteers who are fully evaluated to make sure their blood is safe. This is blood bank blood. Transfusion therapy is the safest it has ever been in the practice of medicine. Before blood is taken from a donor, a complete history is taken to make sure that person has no history of diseases nor engages in risky social behavior (examples are intravenous drug use or sexual activity with multiple partners). The donor's travel history is screened to minimize risk of transmitting infections, such as malaria. The donated blood is tested for signs of infectious diseases, such as HIV and hepatitis. The blood is then tested to be sure it is compatible with you in order to minimize the chance of a transfusion reaction. If you or a relative donates blood, this is often done in anticipation of surgery and is not appropriate for emergency situations. It takes many days to process the donated blood. RISKS AND COMPLICATIONS Although transfusion therapy is very safe and saves many lives, the main dangers of transfusion include:  Getting an infectious disease. Developing a transfusion reaction. This is an allergic reaction to something in the blood you were given. Every precaution is taken to prevent this. The decision to have a blood transfusion has been considered carefully by your caregiver before blood is given. Blood is not given unless the benefits outweigh the risks. AFTER THE TRANSFUSION Right after receiving a blood transfusion, you will usually feel much better and more energetic. This is especially true if your red blood cells have gotten low (anemic). The transfusion raises the level of the red blood cells which carry  oxygen, and this usually causes an energy increase. The nurse administering the transfusion will monitor you carefully for complications. HOME CARE INSTRUCTIONS  No special instructions are needed after a transfusion. You may find your energy is better. Speak with your caregiver about any limitations on activity for underlying diseases you may have. SEEK MEDICAL CARE IF:  Your condition is not improving after your transfusion. You develop redness or  irritation at the intravenous (IV) site. SEEK IMMEDIATE MEDICAL CARE IF:  Any of the following symptoms occur over the next 12 hours: Shaking chills. You have a temperature by mouth above 102 F (38.9 C), not controlled by medicine. Chest, back, or muscle pain. People around you feel you are not acting correctly or are confused. Shortness of breath or difficulty breathing. Dizziness and fainting. You get a rash or develop hives. You have a decrease in urine output. Your urine turns a dark color or changes to pink, red, or brown. Any of the following symptoms occur over the next 10 days: You have a temperature by mouth above 102 F (38.9 C), not controlled by medicine. Shortness of breath. Weakness after normal activity. The white part of the eye turns yellow (jaundice). You have a decrease in the amount of urine or are urinating less often. Your urine turns a dark color or changes to pink, red, or brown. Document Released: 08/12/2000 Document Revised: 11/07/2011 Document Reviewed: 03/31/2008 [facility] Patient Information 2014 [place], [place].  _______________________________________________________________________

## 2024-04-12 ENCOUNTER — Encounter (HOSPITAL_COMMUNITY): Payer: Self-pay

## 2024-04-12 ENCOUNTER — Encounter (HOSPITAL_COMMUNITY)
Admission: RE | Admit: 2024-04-12 | Discharge: 2024-04-12 | Disposition: A | Discharge status: Home / Self Care | Source: Ambulatory Visit | Attending: Orthopaedic Surgery | Admitting: Orthopaedic Surgery

## 2024-04-12 ENCOUNTER — Other Ambulatory Visit: Payer: Self-pay

## 2024-04-12 ENCOUNTER — Other Ambulatory Visit (HOSPITAL_BASED_OUTPATIENT_CLINIC_OR_DEPARTMENT_OTHER): Payer: Self-pay

## 2024-04-12 VITALS — BP 144/90 | HR 88 | Temp 98.2°F | Resp 16 | Ht 68.0 in | Wt 211.0 lb

## 2024-04-12 DIAGNOSIS — G4733 Obstructive sleep apnea (adult) (pediatric): Secondary | ICD-10-CM | POA: Insufficient documentation

## 2024-04-12 DIAGNOSIS — I5032 Chronic diastolic (congestive) heart failure: Secondary | ICD-10-CM | POA: Diagnosis not present

## 2024-04-12 DIAGNOSIS — J449 Chronic obstructive pulmonary disease, unspecified: Secondary | ICD-10-CM | POA: Diagnosis not present

## 2024-04-12 DIAGNOSIS — I110 Hypertensive heart disease with heart failure: Secondary | ICD-10-CM | POA: Insufficient documentation

## 2024-04-12 DIAGNOSIS — E1165 Type 2 diabetes mellitus with hyperglycemia: Secondary | ICD-10-CM

## 2024-04-12 DIAGNOSIS — G250 Essential tremor: Secondary | ICD-10-CM | POA: Insufficient documentation

## 2024-04-12 DIAGNOSIS — I4821 Permanent atrial fibrillation: Secondary | ICD-10-CM | POA: Insufficient documentation

## 2024-04-12 DIAGNOSIS — M1611 Unilateral primary osteoarthritis, right hip: Secondary | ICD-10-CM | POA: Insufficient documentation

## 2024-04-12 DIAGNOSIS — Z7984 Long term (current) use of oral hypoglycemic drugs: Secondary | ICD-10-CM | POA: Diagnosis not present

## 2024-04-12 DIAGNOSIS — I2510 Atherosclerotic heart disease of native coronary artery without angina pectoris: Secondary | ICD-10-CM | POA: Diagnosis not present

## 2024-04-12 DIAGNOSIS — E119 Type 2 diabetes mellitus without complications: Secondary | ICD-10-CM | POA: Diagnosis not present

## 2024-04-12 DIAGNOSIS — Z955 Presence of coronary angioplasty implant and graft: Secondary | ICD-10-CM | POA: Diagnosis not present

## 2024-04-12 DIAGNOSIS — Z01818 Encounter for other preprocedural examination: Secondary | ICD-10-CM

## 2024-04-12 DIAGNOSIS — Z01812 Encounter for preprocedural laboratory examination: Secondary | ICD-10-CM | POA: Insufficient documentation

## 2024-04-12 HISTORY — DX: Tremor, unspecified: R25.1

## 2024-04-12 LAB — BASIC METABOLIC PANEL WITH GFR
Anion gap: 9 (ref 5–15)
BUN: 21 mg/dL (ref 8–23)
CO2: 23 mmol/L (ref 22–32)
Calcium: 9 mg/dL (ref 8.9–10.3)
Chloride: 104 mmol/L (ref 98–111)
Creatinine, Ser: 0.96 mg/dL (ref 0.61–1.24)
GFR, Estimated: >60 mL/min (ref >60)
Glucose, Bld: 163 mg/dL — ABNORMAL HIGH (ref 70–99)
Potassium: 4.6 mmol/L (ref 3.5–5.1)
Sodium: 136 mmol/L (ref 135–145)

## 2024-04-12 LAB — CBC
HCT: 49.1 % (ref 39.0–52.0)
Hemoglobin: 15.8 g/dL (ref 13.0–17.0)
MCH: 27.9 pg (ref 26.0–34.0)
MCHC: 32.2 g/dL (ref 30.0–36.0)
MCV: 86.6 fL (ref 80.0–100.0)
Platelets: 167 K/uL (ref 150–400)
RBC: 5.67 MIL/uL (ref 4.22–5.81)
RDW: 13 % (ref 11.5–15.5)
WBC: 6.3 K/uL (ref 4.0–10.5)
nRBC: 0 % (ref 0.0–0.2)

## 2024-04-12 LAB — SURGICAL PCR SCREEN
MRSA, PCR: NEGATIVE
Staphylococcus aureus: NEGATIVE

## 2024-04-12 LAB — GLUCOSE, CAPILLARY: Glucose-Capillary: 180 mg/dL — ABNORMAL HIGH (ref 70–99)

## 2024-04-15 NOTE — Anesthesia Preprocedure Evaluation (Signed)
 Anesthesia Evaluation  Patient identified by MRN, date of birth, ID band Patient awake    Reviewed: Allergy & Precautions, NPO status , Patient's Chart, lab work & pertinent test results  History of Anesthesia Complications (+) PONV and history of anesthetic complications  Airway Mallampati: IV  TM Distance: <3 FB Neck ROM: Full    Dental  (+) Edentulous Upper, Dental Advisory Given, Missing,    Pulmonary shortness of breath, asthma , sleep apnea and Continuous Positive Airway Pressure Ventilation , COPD, Current Smoker and Patient abstained from smoking.   breath sounds clear to auscultation       Cardiovascular hypertension, Pt. on medications (-) angina + CAD, + Cardiac Stents and +CHF  + dysrhythmias Atrial Fibrillation  Rhythm:Regular  12/24 tte: 1. Left ventricular ejection fraction, by estimation, is 65 to 70%. The  left ventricle has normal function. The left ventricle has no regional  wall motion abnormalities. There is moderate left ventricular hypertrophy.  Left ventricular diastolic  parameters are indeterminate.   2. Right ventricular systolic function is normal. The right ventricular  size is normal. Tricuspid regurgitation signal is inadequate for assessing  PA pressure.   3. The mitral valve is normal in structure. Trivial mitral valve  regurgitation. No evidence of mitral stenosis.   4. The aortic valve is tricuspid. Aortic valve regurgitation is not  visualized. No aortic stenosis is present.    1/25 cath:    Prox LAD lesion is 20% stenosed.   Prox Cx lesion is 40% stenosed.   Dist Cx lesion is 30% stenosed.   Prox RCA lesion is 30% stenosed.   Mid RCA lesion is 30% stenosed.   Ost 1st Diag to 1st Diag lesion is 30% stenosed.   Ost RCA to Prox RCA lesion is 30% stenosed.   1st Mrg lesion is 40% stenosed.   Mid LAD lesion is 40% stenosed.   Previously placed Mid Cx to Dist Cx stent of unknown  type is  widely patent.   Mild to moderate non-obstructive disease in the mid LAD Mild to moderate non-obstructive disease in the proximal Circumflex and obtuse marginal branch Patent mid Circumflex stent without restenosis Large dominant RCA with mild proximal and mid stenosis.  Normal right and left heart pressures   Recommendations: Continue medical management of CAD    Neuro/Psych neg Seizures PSYCHIATRIC DISORDERS Anxiety Depression       GI/Hepatic PUD,GERD  ,,  Endo/Other  diabetes    Renal/GU Lab Results      Component                Value               Date                      NA                       136                 04/12/2024                K                        4.6                 04/12/2024                CO2  23                  04/12/2024                GLUCOSE                  163 (H)             04/12/2024                BUN                      21                  04/12/2024                CREATININE               0.96                04/12/2024                CALCIUM                   9.0                 04/12/2024                GFR                      72.71               04/19/2022                EGFR                     95                  12/07/2023                GFRNONAA                 >60                 04/12/2024                Musculoskeletal  (+) Arthritis ,    Abdominal   Peds  Hematology Lab Results      Component                Value               Date                      WBC                      6.3                 04/12/2024                HGB                      15.8                04/12/2024                HCT                      49.1  04/12/2024                MCV                      86.6                04/12/2024                PLT                      167                 04/12/2024              Anesthesia Other Findings   Reproductive/Obstetrics                               Anesthesia Physical Anesthesia Plan  ASA: 3  Anesthesia Plan: MAC and Spinal   Post-op Pain Management:    Induction: Intravenous  PONV Risk Score and Plan: 1 and Propofol  infusion and Treatment may vary due to age or medical condition  Airway Management Planned: Nasal Cannula, Simple Face Mask and Natural Airway  Additional Equipment: None  Intra-op Plan:   Post-operative Plan: Extubation in OR  Informed Consent: I have reviewed the patients History and Physical, chart, labs and discussed the procedure including the risks, benefits and alternatives for the proposed anesthesia with the patient or authorized representative who has indicated his/her understanding and acceptance.     Dental advisory given  Plan Discussed with: CRNA  Anesthesia Plan Comments: (See PAT note 04/12/2024)         Anesthesia Quick Evaluation

## 2024-04-15 NOTE — Progress Notes (Signed)
 Anesthesia Chart Review   Case: [id number] Date/Time: 04/19/24 0700   Procedure: ARTHROPLASTY, HIP, TOTAL, ANTERIOR APPROACH (Right: Hip) - Needs RNFA   Anesthesia type: Spinal   Diagnosis: Primary osteoarthritis of right hip [M16.11]   Pre-op diagnosis: osteoarthritis right hip   Location: [facility]OM 09 / [facility]   Surgeons: Vernetta Lonni GRADE, MD       DISCUSSION:67 y.o. smoker with h/o PONV, COPD, HTN, OSA, atrial fibrillation, CAD s/p DES to dLCx 2016, chronic diastolic heart failure, non insulin  dependent DM II (A1C 7.5), right hip OA scheduled for above procedure 04/19/24 with Dr. Lonni Vernetta.   Pt follows with neurology for essential tremor. Given gait instability and family h/o Parkinson's DaTscan will be ordered after hip surgery.   Pt last seen by cardiology 09/29/23. Echocardiogram was ordered on 08/14/2023 which showed EF 65 to 70%, no regional wall motion abnormality, moderate LVH, trivial MR. PET stress test ordered and completed on 07/10/2023 which showed normal perfusion, normal LV EF, no evidence of ischemia or infarction, abnormal MBF with finding either representing microvascular disease but cannot exclude three-vessel CAD. Based on the result of the abnormal stress test, patient ultimately underwent left and right heart cath on 09/19/2023 which showed 40% proximal left circumflex lesion, 30% distal left circumflex lesion, 20% proximal LAD lesion, 30% proximal RCA lesion, 30% mid RCA lesion, 40% OM1 lesion, 40% mid LAD lesion, patent mid to distal left circumflex stent. Overall nonobstructive disease. Stable at 09/29/23 visit without sx.   Per cardiology preoperative evaluation 12/06/2023, Chart reviewed as part of pre-operative protocol coverage. Given past medical history and time since last visit, based on ACC/AHA guidelines, Shaun [name] is at acceptable risk for the planned procedure without further cardiovascular testing.    Patient cleared for surgery from a  cardiac perspective during office visit with Hao Meng, PA on 09/29/2023.  Per office protocol, patient can hold Eliquis  for 3 days prior to procedure.   Patient will not need bridging with Lovenox  (enoxaparin ) around procedure.  Pt reports last dose of Eliquis  04/15/2024 PM dose.  VS: BP (!) 144/90   Pulse 88   Temp 36.8 C (Oral)   Resp 16   Ht 5' 8 (1.727 m)   Wt 95.7 kg   SpO2 98%   BMI 32.08 kg/m   PROVIDERS: Early, Camie BRAVO, NP is PCP   Cardiologist - Redell Shallow, MD   Tat, Asberry RAMAN, DO is Neurologist  LABS: Labs reviewed: Acceptable for surgery. (all labs ordered are listed, but only abnormal results are displayed)  Labs Reviewed  BASIC METABOLIC PANEL WITH GFR - Abnormal; Notable for the following components:      Result Value   Glucose, Bld 163 (*)    All other components within normal limits  GLUCOSE, CAPILLARY - Abnormal; Notable for the following components:   Glucose-Capillary 180 (*)    All other components within normal limits  SURGICAL PCR SCREEN  CBC  TYPE AND SCREEN     IMAGES:   EKG:   CV: Cardiac Cath 09/19/23   Prox LAD lesion is 20% stenosed.   Prox Cx lesion is 40% stenosed.   Dist Cx lesion is 30% stenosed.   Prox RCA lesion is 30% stenosed.   Mid RCA lesion is 30% stenosed.   Ost 1st Diag to 1st Diag lesion is 30% stenosed.   Ost RCA to Prox RCA lesion is 30% stenosed.   1st Mrg lesion is 40% stenosed.   Mid LAD  lesion is 40% stenosed.   Previously placed Mid Cx to Dist Cx stent of unknown type is  widely patent.   Mild to moderate non-obstructive disease in the mid LAD Mild to moderate non-obstructive disease in the proximal Circumflex and obtuse marginal branch Patent mid Circumflex stent without restenosis Large dominant RCA with mild proximal and mid stenosis.  Normal right and left heart pressures   Recommendations: Continue medical management of CAD  Echo 08/14/2023 1. Left ventricular ejection fraction, by estimation,  is 65 to 70%. The  left ventricle has normal function. The left ventricle has no regional  wall motion abnormalities. There is moderate left ventricular hypertrophy.  Left ventricular diastolic  parameters are indeterminate.   2. Right ventricular systolic function is normal. The right ventricular  size is normal. Tricuspid regurgitation signal is inadequate for assessing  PA pressure.   3. The mitral valve is normal in structure. Trivial mitral valve  regurgitation. No evidence of mitral stenosis.   4. The aortic valve is tricuspid. Aortic valve regurgitation is not  visualized. No aortic stenosis is present.  Past Medical History:  Diagnosis Date   A-fib (HCC)    Anginal pain (HCC)    Anxiety    Bacterial URI 08/29/2022   CAD (coronary artery disease)    DES to distal circumflex 2016   Cataract    CHF (congestive heart failure) (HCC)    Colon polyps    30 colon polyps found on first colonoscopy   Complication of anesthesia    COPD (chronic obstructive pulmonary disease) (HCC)    Depression    Diastolic heart failure (HCC)    Diverticulitis    DJD (degenerative joint disease)    Dyspnea    Dysrhythmia    A-Fib   Genetic testing 09/09/2014   Negative genetic testing on the ColoNext panel test and the MSH2 inversion testing.  The ColoNext gene panel offered by [facility] and includes sequencing and rearrangement analysis for the following 17 genes: APC, BMPR1A, CDH1, CHEK2, EPCAM, GREM1, MLH1, MSH2, MSH6, MUTYH, PMS2, POLD1, POLE, PTEN, SMAD4, STK11, and TP53.   The report date is 09/08/14.      GERD (gastroesophageal reflux disease)    History of kidney stones    Hyperlipidemia    Hypernatremia 08/22/2021   Hypertension    Insomnia    Obstructive sleep apnea 12/2009   01/26/2010 AHI 83/hr   Permanent atrial fibrillation (HCC)    Onset 2006 paroxysmal then progressive to persistent   Pneumonia    PONV (postoperative nausea and vomiting)    PUD (peptic ulcer disease)     1980s   RLS (restless legs syndrome)    Sinusitis    Skin cancer    Tremor    Type 2 diabetes mellitus (HCC)     Past Surgical History:  Procedure Laterality Date   BIOPSY  07/17/2018   Procedure: BIOPSY;  Surgeon: Harvey Margo CROME, MD;  [facility];  Service: Endoscopy;;  colon   BOWEL RESECTION  09/17/2018   SMALL BOWEL RESECTION: 71 CM    CARDIAC CATHETERIZATION N/A 07/21/2015   Procedure: Left Heart Cath and Coronary Angiography;  Surgeon: Peter M Swaziland, MD;  Location: [facility] INVASIVE CV LAB;  Service: Cardiovascular;  Laterality: N/A;   CARDIAC CATHETERIZATION N/A 07/21/2015   Procedure: Coronary Stent Intervention;  Surgeon: Peter M Swaziland, MD;  Location: [facility] INVASIVE CV LAB;  Service: Cardiovascular;  Laterality: N/A;   CIRCUMCISION N/A 04/05/2019   Procedure: CIRCUMCISION ADULT;  Surgeon: Watt Rush, MD;  Location: [facility];  Service: Urology;  Laterality: N/A;   COLONOSCOPY N/A 05/19/2014   Dr. Sharla diverticulosis/moderate external hemorrhoids, >20 simple adenomas. Genetic screening negative.    COLONOSCOPY WITH PROPOFOL  N/A 07/17/2018   Dr. harvey: Diverticulosis, external/internal hemorrhoids, 32 colon polyps removed.  ten tubular adenomas removed with no high-grade dysplasia.  Advised to have surveillance colonoscopy in 3 years.   COLONOSCOPY WITH PROPOFOL  N/A 06/07/2021   Procedure: COLONOSCOPY WITH PROPOFOL ;  Surgeon: Cindie Carlin POUR, DO;  [facility];  Service: Endoscopy;  Laterality: N/A;  9:30 / ASA 3  (Pt was told that his time will be given at Pre-op)   CORONARY PRESSURE/FFR STUDY Left 06/08/2017   Procedure: INTRAVASCULAR PRESSURE WIRE/FFR STUDY;  Surgeon: Mady Bruckner, MD;  Location: [facility] INVASIVE CV LAB;  Service: Cardiovascular;  Laterality: Left;  LAD and CFX   ESOPHAGOGASTRODUODENOSCOPY (EGD) WITH PROPOFOL  N/A 07/17/2018   Dr. harvey: Low-grade narrowing Schatzki ring at the GE junction status post dilation.  Gastritis.  Biopsy with  mild nonspecific reactive gastropathy.  No H. pylori.   GIVENS CAPSULE STUDY N/A 06/24/2019   normal   HERNIA REPAIR  1986   Left inguinal   INCISIONAL HERNIA REPAIR N/A 10/19/2022   Procedure: OPEN INCISIONAL HERNIA REPAIR;  Surgeon: Dasie Leonor CROME, MD;  Location: [facility] OR;  Service: General;  Laterality: N/A;   INSERTION OF MESH N/A 10/19/2022   Procedure: INSERTION OF MESH;  Surgeon: Dasie Leonor CROME, MD;  Location: [facility] OR;  Service: General;  Laterality: N/A;   LAPAROTOMY N/A 09/17/2018   Procedure: EXPLORATORY LAPAROTOMY;  Surgeon: Kallie Manuelita BROCKS, MD;  Location: [facility];  Service: General;  Laterality: N/A;   LEFT HEART CATH AND CORONARY ANGIOGRAPHY N/A 06/08/2017   Procedure: LEFT HEART CATH AND CORONARY ANGIOGRAPHY;  Surgeon: Mady Bruckner, MD;  Location: [facility]VASIVE CV LAB;  Service: Cardiovascular;  Laterality: N/A;   POLYPECTOMY  07/17/2018   Procedure: POLYPECTOMY;  Surgeon: harvey Margo CROME, MD;  [facility];  Service: Endoscopy;;  colon   POLYPECTOMY  06/07/2021   Procedure: POLYPECTOMY INTESTINAL;  Surgeon: Cindie Carlin POUR, DO;  [facility];  Service: Endoscopy;;   RIGHT/LEFT HEART CATH AND CORONARY ANGIOGRAPHY N/A 09/19/2023   Procedure: RIGHT/LEFT HEART CATH AND CORONARY ANGIOGRAPHY;  Surgeon: Verlin Bruckner BIRCH, MD;  Location: [facility] INVASIVE CV LAB;  Service: Cardiovascular;  Laterality: N/A;   ROTATOR CUFF REPAIR     Right   SAVORY DILATION N/A 07/17/2018   Procedure: SAVORY DILATION;  Surgeon: harvey Margo CROME, MD;  [facility];  Service: Endoscopy;  Laterality: N/A;    MEDICATIONS:  albuterol  (PROVENTIL ) (2.5 MG/3ML) 0.083% nebulizer solution   albuterol  (VENTOLIN  HFA) 108 (90 Base) MCG/ACT inhaler   apixaban  (ELIQUIS ) 5 MG TABS tablet   ascorbic acid (VITAMIN C ) 500 MG tablet   atorvastatin  (LIPITOR ) 80 MG tablet   buPROPion  (WELLBUTRIN  XL) 150 MG 24 hr tablet   Chlorphen-Phenyleph-APAP (CORICIDIN D COLD/FLU/SINUS) 2-5-325 MG  TABS   docusate sodium  (COLACE) 100 MG capsule   empagliflozin  (JARDIANCE ) 10 MG TABS tablet   escitalopram  (LEXAPRO ) 20 MG tablet   ezetimibe  (ZETIA ) 10 MG tablet   ferrous sulfate  (FEROSUL) 325 (65 FE) MG tablet   furosemide  (LASIX ) 40 MG tablet   HYDROcodone -acetaminophen  (NORCO/VICODIN) 5-325 MG tablet   hydrOXYzine  (ATARAX ) 25 MG tablet   metFORMIN  (GLUCOPHAGE ) 500 MG tablet   methimazole  (TAPAZOLE ) 5 MG tablet   metoprolol  tartrate (LOPRESSOR ) 100  MG tablet   nicotine  (NICODERM CQ  - DOSED IN MG/24 HR) 7 mg/24hr patch   nitroGLYCERIN  (NITROSTAT ) 0.4 MG SL tablet   pantoprazole  (PROTONIX ) 40 MG tablet   polyethylene glycol powder (GLYCOLAX /MIRALAX ) 17 GM/SCOOP powder   potassium chloride  SA (KLOR-CON  M) 20 MEQ tablet   rOPINIRole  (REQUIP ) 3 MG tablet   sildenafil  (VIAGRA ) 100 MG tablet   tiZANidine  (ZANAFLEX ) 4 MG tablet   traZODone  (DESYREL ) 150 MG tablet   Vitamin D , Ergocalciferol , (DRISDOL ) 1.25 MG (50000 UNIT) CAPS capsule    lidocaine  (XYLOCAINE ) 1 % (with pres) injection 5 mL     [facility], PA-C [facility] Pre-Surgical Testing [phone]

## 2024-04-18 NOTE — H&P (Signed)
 TOTAL HIP ADMISSION H&P  Patient is admitted for right total hip arthroplasty.  Subjective:  Chief Complaint: right hip pain  HPI: Shaun [name], 68 y.o. male, has a history of pain and functional disability in the right hip(s) due to arthritis and patient has failed non-surgical conservative treatments for greater than 12 weeks to include NSAID's and/or analgesics, corticosteriod injections, flexibility and strengthening excercises, use of assistive devices, weight reduction as appropriate, and activity modification.  Onset of symptoms was gradual starting a few years ago with gradually worsening course since that time.The patient noted no past surgery on the right hip(s).  Patient currently rates pain in the right hip at 10 out of 10 with activity. Patient has night pain, worsening of pain with activity and weight bearing, trendelenberg gait, pain that interfers with activities of daily living, and pain with passive range of motion. Patient has evidence of subchondral sclerosis, periarticular osteophytes, and joint space narrowing by imaging studies. This condition presents safety issues increasing the risk of falls.  There is no current active infection.  Patient Active Problem List   Diagnosis Date Noted   Vitamin D  deficiency 12/05/2023   Smoking trying to quit 12/05/2023   Constipation 09/12/2023   GERD (gastroesophageal reflux disease)    Tremor 11/09/2022   SBO (small bowel obstruction) (HCC) 10/29/2022   Thyroid  dysfunction 05/31/2022   Current moderate episode of major depressive disorder without prior episode (HCC) 04/19/2022   Debility 10/21/2021   Sundowning 08/23/2021   Dysphagia 08/22/2021   Obesity (BMI 30-39.9) 08/21/2021   Ventral incisional hernia without obstruction or gangrene 12/31/2020   Dyslipidemia, goal LDL below 70 06/18/2019   Anemia 10/17/2018   Phimosis of penis 09/27/2018   Unilateral primary osteoarthritis, right hip 06/20/2018   PUD (peptic ulcer  disease) 05/21/2018   RLS (restless legs syndrome)    Insomnia    Chronic diastolic CHF (congestive heart failure) (HCC)    Asthma    Hypercoagulable state (HCC) 01/27/2017   COPD (chronic obstructive pulmonary disease) (HCC) 07/20/2015   Coronary artery disease of native artery of native heart with stable angina pectoris (HCC) 07/19/2015   Encounter for annual physical exam 09/09/2014   Hx of adenomatous colonic polyps 08/11/2014   Type 2 diabetes mellitus with hyperglycemia, without long-term current use of insulin  (HCC) 02/09/2011   CAD S/P percutaneous coronary angioplasty    Hypertension associated with chronic kidney disease due to type 2 diabetes mellitus (HCC)    Chronic atrial fibrillation (HCC) 02/23/2010   Obstructive sleep apnea 12/27/2009   Past Medical History:  Diagnosis Date   A-fib (HCC)    Anginal pain (HCC)    Anxiety    Bacterial URI 08/29/2022   CAD (coronary artery disease)    DES to distal circumflex 2016   Cataract    CHF (congestive heart failure) (HCC)    Colon polyps    30 colon polyps found on first colonoscopy   Complication of anesthesia    COPD (chronic obstructive pulmonary disease) (HCC)    Depression    Diastolic heart failure (HCC)    Diverticulitis    DJD (degenerative joint disease)    Dyspnea    Dysrhythmia    A-Fib   Genetic testing 09/09/2014   Negative genetic testing on the ColoNext panel test and the MSH2 inversion testing.  The ColoNext gene panel offered by [facility] and includes sequencing and rearrangement analysis for the following 17 genes: APC, BMPR1A, CDH1, CHEK2, EPCAM, GREM1, MLH1, MSH2, MSH6,  MUTYH, PMS2, POLD1, POLE, PTEN, SMAD4, STK11, and TP53.   The report date is 09/08/14.      GERD (gastroesophageal reflux disease)    History of kidney stones    Hyperlipidemia    Hypernatremia 08/22/2021   Hypertension    Insomnia    Obstructive sleep apnea 12/2009   01/26/2010 AHI 83/hr   Permanent atrial fibrillation (HCC)     Onset 2006 paroxysmal then progressive to persistent   Pneumonia    PONV (postoperative nausea and vomiting)    PUD (peptic ulcer disease)    1980s   RLS (restless legs syndrome)    Sinusitis    Skin cancer    Tremor    Type 2 diabetes mellitus (HCC)     Past Surgical History:  Procedure Laterality Date   BIOPSY  07/17/2018   Procedure: BIOPSY;  Surgeon: Harvey Margo CROME, MD;  Location: [facility];  Service: Endoscopy;;  colon   BOWEL RESECTION  09/17/2018   SMALL BOWEL RESECTION: 71 CM    CARDIAC CATHETERIZATION N/A 07/21/2015   Procedure: Left Heart Cath and Coronary Angiography;  Surgeon: Peter M Swaziland, MD;  Location: [facility] INVASIVE CV LAB;  Service: Cardiovascular;  Laterality: N/A;   CARDIAC CATHETERIZATION N/A 07/21/2015   Procedure: Coronary Stent Intervention;  Surgeon: Peter M Swaziland, MD;  Location: [facility] INVASIVE CV LAB;  Service: Cardiovascular;  Laterality: N/A;   CIRCUMCISION N/A 04/05/2019   Procedure: CIRCUMCISION ADULT;  Surgeon: Watt Rush, MD;  Location: [facility] OR[facility];  Service: Urology;  Laterality: N/A;   COLONOSCOPY N/A 05/19/2014   Dr. Sharla diverticulosis/moderate external hemorrhoids, >20 simple adenomas. Genetic screening negative.    COLONOSCOPY WITH PROPOFOL  N/A 07/17/2018   Dr. harvey: Diverticulosis, external/internal hemorrhoids, 32 colon polyps removed.  ten tubular adenomas removed with no high-grade dysplasia.  Advised to have surveillance colonoscopy in 3 years.   COLONOSCOPY WITH PROPOFOL  N/A 06/07/2021   Procedure: COLONOSCOPY WITH PROPOFOL ;  Surgeon: Cindie Carlin POUR, DO;  Location: [facility];  Service: Endoscopy;  Laterality: N/A;  9:30 / ASA 3  (Pt was told that his time will be given at Pre-op)   CORONARY PRESSURE/FFR STUDY Left 06/08/2017   Procedure: INTRAVASCULAR PRESSURE WIRE/FFR STUDY;  Surgeon: Mady Bruckner, MD;  Location: [facility] INVASIVE CV LAB;  Service: Cardiovascular;  Laterality: Left;  LAD and CFX   ESOPHAGOGASTRODUODENOSCOPY (EGD)  WITH PROPOFOL  N/A 07/17/2018   Dr. harvey: Low-grade narrowing Schatzki ring at the GE junction status post dilation.  Gastritis.  Biopsy with mild nonspecific reactive gastropathy.  No H. pylori.   GIVENS CAPSULE STUDY N/A 06/24/2019   normal   HERNIA REPAIR  1986   Left inguinal   INCISIONAL HERNIA REPAIR N/A 10/19/2022   Procedure: OPEN INCISIONAL HERNIA REPAIR;  Surgeon: Dasie Leonor CROME, MD;  Location[facility] OR;  Service: General;  Laterality: N/A;   INSERTION OF MESH N/A 10/19/2022   Procedure: INSERTION OF MESH;  Surgeon: Dasie Leonor CROME, MD;  Location: [facility] OR;  Service: General;  Laterality: N/A;   LAPAROTOMY N/A 09/17/2018   Procedure: EXPLORATORY LAPAROTOMY;  Surgeon: Kallie Manuelita BROCKS, MD;  Location: [facility];  Service: General;  Laterality: N/A;   LEFT HEART CATH AND CORONARY ANGIOGRAPHY N/A 06/08/2017   Procedure: LEFT HEART CATH AND CORONARY ANGIOGRAPHY;  Surgeon: Mady Bruckner, MD;  Location: [facility] INVASIVE CV LAB;  Service: Cardiovascular;  Laterality: N/A;   POLYPECTOMY  07/17/2018   Procedure: POLYPECTOMY;  Surgeon: Harvey Margo CROME, MD;  Location: [facility];  Service: Endoscopy;;  colon   POLYPECTOMY  06/07/2021   Procedure: POLYPECTOMY INTESTINAL;  Surgeon: Cindie Carlin POUR, DO;  Location: [facility];  Service: Endoscopy;;   RIGHT/LEFT HEART CATH AND CORONARY ANGIOGRAPHY N/A 09/19/2023   Procedure: RIGHT/LEFT HEART CATH AND CORONARY ANGIOGRAPHY;  Surgeon: Verlin Lonni BIRCH, MD;  Location: [facility] INVASIVE CV LAB;  Service: Cardiovascular;  Laterality: N/A;   ROTATOR CUFF REPAIR     Right   SAVORY DILATION N/A 07/17/2018   Procedure: SAVORY DILATION;  Surgeon: Harvey Margo CROME, MD;  Location: [facility];  Service: Endoscopy;  Laterality: N/A;    Current Facility-Administered Medications  Medication Dose Route Frequency Provider Last Rate Last Admin   lidocaine  (XYLOCAINE ) 1 % (with pres) injection 5 mL  5 mL Other Once Kirsteins, Prentice BRAVO, MD       Current Outpatient  Medications  Medication Sig Dispense Refill Last Dose/Taking   albuterol  (PROVENTIL ) (2.5 MG/3ML) 0.083% nebulizer solution Take 3 mLs (2.5 mg total) by nebulization every 6 (six) hours as needed for wheezing or shortness of breath. 75 mL 12 Taking As Needed   albuterol  (VENTOLIN  HFA) 108 (90 Base) MCG/ACT inhaler INHALE 2 PUFFS INTO THE LUNGS EVERY 6 HOURS AS NEEDED FOR WHEEZING OR SHORTNESS OF BREATH 6.7 g 1 Taking   apixaban  (ELIQUIS ) 5 MG TABS tablet Take 1 tablet (5 mg total) by mouth 2 (two) times daily. 180 tablet 1 Taking   ascorbic acid (VITAMIN C ) 500 MG tablet Take 500 mg by mouth daily.   Taking   atorvastatin  (LIPITOR ) 80 MG tablet Take 1 tablet (80 mg total) by mouth every evening. 90 tablet 1 Taking   buPROPion  (WELLBUTRIN  XL) 150 MG 24 hr tablet Take 3 tablets (450 mg total) by mouth daily. 270 tablet 3 Taking   Chlorphen-Phenyleph-APAP (CORICIDIN D COLD/FLU/SINUS) 2-5-325 MG TABS Take 1 tablet by mouth every 6 (six) hours as needed (Cold/Cough).   Taking As Needed   docusate sodium  (COLACE) 100 MG capsule Take 1 capsule (100 mg total) by mouth daily as needed for mild constipation. 100 capsule 3 Taking As Needed   empagliflozin  (JARDIANCE ) 10 MG TABS tablet Take 10 mg by mouth daily. Through BI Cares PAP   Taking   escitalopram  (LEXAPRO ) 20 MG tablet Take 20 mg by mouth in the morning.   Taking   ezetimibe  (ZETIA ) 10 MG tablet Take 1 tablet (10 mg total) by mouth daily. 90 tablet 3 Taking   ferrous sulfate  (FEROSUL) 325 (65 FE) MG tablet Take 1 tablet (325 mg total) by mouth 3 (three) times a week. 30 tablet 1 Taking   furosemide  (LASIX ) 40 MG tablet Take 1 tablet (40mg ) daily. May take 1/2 tablet (20mg ) additional if shortness of breath or swelling occurs. 120 tablet 1 Taking   HYDROcodone -acetaminophen  (NORCO/VICODIN) 5-325 MG tablet Take 1-2 tablets by mouth 2 (two) times daily as needed for moderate pain (pain score 4-6) or severe pain (pain score 7-10). 120 tablet 0 Taking As  Needed   hydrOXYzine  (ATARAX ) 25 MG tablet Take 1 tablet (25 mg total) by mouth at bedtime and may repeat dose one time if needed for sleep. (Patient taking differently: Take 25-50 mg by mouth at bedtime as needed (Sleep).) 60 tablet 2 Taking Differently   metFORMIN  (GLUCOPHAGE ) 500 MG tablet Take 1 tablet (500 mg total) by mouth 2 (two) times daily before a meal. 180 tablet 1 Taking   methimazole  (TAPAZOLE ) 5 MG tablet Take 1 tablet (5 mg total) by mouth  as directed. 1 tablet 4 days a week 52 tablet 3 Taking   metoprolol  tartrate (LOPRESSOR ) 100 MG tablet Take 1 tablet (100 mg total) by mouth 2 (two) times daily. 180 tablet 3 Taking   nitroGLYCERIN  (NITROSTAT ) 0.4 MG SL tablet Place 1 tablet (0.4 mg total) under the tongue every 5 (five) minutes as needed. 25 tablet 3 Taking As Needed   pantoprazole  (PROTONIX ) 40 MG tablet Take 1 tablet (40 mg total) by mouth daily before breakfast. 90 tablet 3 Taking   polyethylene glycol powder (GLYCOLAX /MIRALAX ) 17 GM/SCOOP powder Take one capful (17g) twice daily until soft stool, then continue one capful once daily as needed. (Patient taking differently: Take 17 g by mouth daily as needed for moderate constipation.) 510 g 11 Taking Differently   potassium chloride  SA (KLOR-CON  M) 20 MEQ tablet Take 1 tablet (20 mEq total) by mouth daily. 90 tablet 3 Taking   rOPINIRole  (REQUIP ) 3 MG tablet Take 1 tablet (3 mg total) by mouth at bedtime, AND 0.5 tablets (1.5 mg total) during the day for breakthrough symptoms. 45 tablet 1 Taking   sildenafil  (VIAGRA ) 100 MG tablet Take 1 tablet (100 mg total) by mouth as needed for erectile dysfunction. Do not take nitroglycerine within 24 hours of last dose. 8 tablet 6 Taking As Needed   tiZANidine  (ZANAFLEX ) 4 MG tablet Take 1 tablet (4 mg total) by mouth 2 (two) times daily as needed for muscle spasms. 60 tablet 2 Taking As Needed   traZODone  (DESYREL ) 150 MG tablet Take 1 tablet (150 mg total) by mouth at bedtime. 90 tablet 1  Taking   Vitamin D , Ergocalciferol , (DRISDOL ) 1.25 MG (50000 UNIT) CAPS capsule Take 1 capsule (50,000 Units total) by mouth every 7 (seven) days. 12 capsule 3 Taking   nicotine  (NICODERM CQ  - DOSED IN MG/24 HR) 7 mg/24hr patch For Weeks 5-8. Place patch onto skin and wear for 24 hours. If you have sleep disturbances, remove at bedtime. (Patient not taking: Reported on 04/12/2024) 28 patch 0 Not Taking   No Known Allergies  Social History   Tobacco Use   Smoking status: Every Day    Current packs/day: 0.50    Types: Cigarettes    Passive exposure: Past   Smokeless tobacco: Never   Tobacco comments:    07/10/2023 Patient smokes 10-12 cigarettes daily  Substance Use Topics   Alcohol use: Not Currently    Family History  Problem Relation Age of Onset   Hypertension Mother    Breast cancer Mother 55       brain/bone    Heart attack Father    Skin cancer Sister 71   Breast cancer Sister 79   Diabetes Brother    Parkinson's disease Brother    Brain cancer Maternal Uncle    Cancer Maternal Uncle        NOS   Breast cancer Cousin        maternal cousin dx <50   Cancer Cousin    Colon cancer Neg Hx      Review of Systems  Objective:  Physical Exam Vitals reviewed.  Constitutional:      Appearance: Normal appearance.  HENT:     Head: Normocephalic and atraumatic.  Eyes:     Extraocular Movements: Extraocular movements intact.     Pupils: Pupils are equal, round, and reactive to light.  Cardiovascular:     Rate and Rhythm: Normal rate. Rhythm irregular.  Pulmonary:     Effort: Pulmonary effort is normal.  Breath sounds: Normal breath sounds.  Abdominal:     Palpations: Abdomen is soft.  Musculoskeletal:     Cervical back: Normal range of motion and neck supple.     Right hip: Tenderness and bony tenderness present. Decreased range of motion. Decreased strength.  Neurological:     Mental Status: He is alert and oriented to person, place, and time.  Psychiatric:         Behavior: Behavior normal.     Vital signs in last 24 hours:    Labs:   Estimated body mass index is 32.08 kg/m as calculated from the following:   Height as of 04/12/24: 5' 8 (1.727 m).   Weight as of 04/12/24: 95.7 kg.   Imaging Review Plain radiographs demonstrate severe degenerative joint disease of the right hip(s). The bone quality appears to be good for age and reported activity level.      Assessment/Plan:  End stage arthritis, right hip(s)  The patient history, physical examination, clinical judgement of the provider and imaging studies are consistent with end stage degenerative joint disease of the right hip(s) and total hip arthroplasty is deemed medically necessary. The treatment options including medical management, injection therapy, arthroscopy and arthroplasty were discussed at length. The risks and benefits of total hip arthroplasty were presented and reviewed. The risks due to aseptic loosening, infection, stiffness, dislocation/subluxation,  thromboembolic complications and other imponderables were discussed.  The patient acknowledged the explanation, agreed to proceed with the plan and consent was signed. Patient is being admitted for inpatient treatment for surgery, pain control, PT, OT, prophylactic antibiotics, VTE prophylaxis, progressive ambulation and ADL's and discharge planning.The patient is planning to be discharged home with home health services

## 2024-04-19 ENCOUNTER — Other Ambulatory Visit: Payer: Self-pay | Admitting: Nurse Practitioner

## 2024-04-19 ENCOUNTER — Other Ambulatory Visit: Payer: Self-pay

## 2024-04-19 ENCOUNTER — Observation Stay (HOSPITAL_COMMUNITY)

## 2024-04-19 ENCOUNTER — Ambulatory Visit (HOSPITAL_COMMUNITY): Admitting: Registered Nurse

## 2024-04-19 ENCOUNTER — Encounter (HOSPITAL_COMMUNITY): Admitting: Medical

## 2024-04-19 ENCOUNTER — Observation Stay (HOSPITAL_COMMUNITY)
Admission: RE | Admit: 2024-04-19 | Discharge: 2024-04-20 | Disposition: A | Discharge status: Home / Self Care | Attending: Orthopaedic Surgery | Admitting: Orthopaedic Surgery

## 2024-04-19 ENCOUNTER — Encounter (HOSPITAL_COMMUNITY)
Admission: RE | Disposition: A | Discharge status: Home / Self Care | Payer: Self-pay | Source: Home / Self Care | Attending: Orthopaedic Surgery

## 2024-04-19 ENCOUNTER — Other Ambulatory Visit (HOSPITAL_BASED_OUTPATIENT_CLINIC_OR_DEPARTMENT_OTHER): Payer: Self-pay

## 2024-04-19 ENCOUNTER — Encounter (HOSPITAL_COMMUNITY): Payer: Self-pay | Admitting: Orthopaedic Surgery

## 2024-04-19 ENCOUNTER — Ambulatory Visit (HOSPITAL_COMMUNITY)

## 2024-04-19 DIAGNOSIS — F17210 Nicotine dependence, cigarettes, uncomplicated: Secondary | ICD-10-CM | POA: Diagnosis not present

## 2024-04-19 DIAGNOSIS — E1165 Type 2 diabetes mellitus with hyperglycemia: Secondary | ICD-10-CM

## 2024-04-19 DIAGNOSIS — N189 Chronic kidney disease, unspecified: Secondary | ICD-10-CM | POA: Insufficient documentation

## 2024-04-19 DIAGNOSIS — Z7901 Long term (current) use of anticoagulants: Secondary | ICD-10-CM | POA: Insufficient documentation

## 2024-04-19 DIAGNOSIS — Z96641 Presence of right artificial hip joint: Secondary | ICD-10-CM | POA: Diagnosis present

## 2024-04-19 DIAGNOSIS — I110 Hypertensive heart disease with heart failure: Secondary | ICD-10-CM

## 2024-04-19 DIAGNOSIS — Z79899 Other long term (current) drug therapy: Secondary | ICD-10-CM | POA: Insufficient documentation

## 2024-04-19 DIAGNOSIS — I130 Hypertensive heart and chronic kidney disease with heart failure and stage 1 through stage 4 chronic kidney disease, or unspecified chronic kidney disease: Secondary | ICD-10-CM | POA: Insufficient documentation

## 2024-04-19 DIAGNOSIS — M1611 Unilateral primary osteoarthritis, right hip: Secondary | ICD-10-CM | POA: Diagnosis not present

## 2024-04-19 DIAGNOSIS — E1122 Type 2 diabetes mellitus with diabetic chronic kidney disease: Secondary | ICD-10-CM | POA: Diagnosis not present

## 2024-04-19 DIAGNOSIS — I2510 Atherosclerotic heart disease of native coronary artery without angina pectoris: Secondary | ICD-10-CM

## 2024-04-19 DIAGNOSIS — Z85820 Personal history of malignant melanoma of skin: Secondary | ICD-10-CM | POA: Diagnosis not present

## 2024-04-19 DIAGNOSIS — I25119 Atherosclerotic heart disease of native coronary artery with unspecified angina pectoris: Secondary | ICD-10-CM | POA: Insufficient documentation

## 2024-04-19 DIAGNOSIS — Z7984 Long term (current) use of oral hypoglycemic drugs: Secondary | ICD-10-CM | POA: Insufficient documentation

## 2024-04-19 DIAGNOSIS — I5032 Chronic diastolic (congestive) heart failure: Secondary | ICD-10-CM

## 2024-04-19 DIAGNOSIS — M25551 Pain in right hip: Secondary | ICD-10-CM | POA: Diagnosis present

## 2024-04-19 DIAGNOSIS — J4489 Other specified chronic obstructive pulmonary disease: Secondary | ICD-10-CM | POA: Diagnosis not present

## 2024-04-19 DIAGNOSIS — G2581 Restless legs syndrome: Secondary | ICD-10-CM

## 2024-04-19 HISTORY — PX: TOTAL HIP ARTHROPLASTY: SHX124

## 2024-04-19 LAB — GLUCOSE, CAPILLARY
Glucose-Capillary: 153 mg/dL — ABNORMAL HIGH (ref 70–99)
Glucose-Capillary: 144 mg/dL — ABNORMAL HIGH (ref 70–99)
Glucose-Capillary: 200 mg/dL — ABNORMAL HIGH (ref 70–99)
Glucose-Capillary: 297 mg/dL — ABNORMAL HIGH (ref 70–99)
Glucose-Capillary: 242 mg/dL — ABNORMAL HIGH (ref 70–99)

## 2024-04-19 LAB — TYPE AND SCREEN
ABO/RH(D): B POS
Antibody Screen: NEGATIVE

## 2024-04-19 SURGERY — ARTHROPLASTY, HIP, TOTAL, ANTERIOR APPROACH
Anesthesia: Monitor Anesthesia Care | Site: Hip | Laterality: Right

## 2024-04-19 MED ORDER — 0.9 % SODIUM CHLORIDE (POUR BTL) OPTIME
TOPICAL | Status: DC | PRN
  Administered 2024-04-19: 1000 mL

## 2024-04-19 MED ORDER — DIPHENHYDRAMINE HCL 12.5 MG/5ML PO ELIX: 12.5000 mg | ORAL_SOLUTION | ORAL | Status: DC | PRN

## 2024-04-19 MED ORDER — ONDANSETRON HCL 4 MG/2ML IJ SOLN
INTRAMUSCULAR | Status: DC | PRN
  Administered 2024-04-19: 4 mg via INTRAVENOUS

## 2024-04-19 MED ORDER — ALUM & MAG HYDROXIDE-SIMETH 200-200-20 MG/5ML PO SUSP: 30.0000 mL | ORAL | Status: DC | PRN

## 2024-04-19 MED ORDER — ORAL CARE MOUTH RINSE: 15.0000 mL | OROMUCOSAL | Status: DC | PRN

## 2024-04-19 MED ORDER — SODIUM CHLORIDE 0.9 % IR SOLN
Status: DC | PRN
  Administered 2024-04-19: 1000 mL

## 2024-04-19 MED ORDER — PHENOL 1.4 % MT LIQD: 1.0000 | OROMUCOSAL | Status: DC | PRN

## 2024-04-19 MED ORDER — EZETIMIBE 10 MG PO TABS
10.0000 mg | ORAL_TABLET | Freq: Every day | ORAL | Status: DC
  Administered 2024-04-19 – 2024-04-20 (×2): 10 mg via ORAL
  Filled 2024-04-19 (×2): qty 1

## 2024-04-19 MED ORDER — TIZANIDINE HCL 4 MG PO TABS
4.0000 mg | ORAL_TABLET | Freq: Four times a day (QID) | ORAL | Status: DC | PRN
  Administered 2024-04-19 – 2024-04-20 (×3): 4 mg via ORAL
  Filled 2024-04-19 (×3): qty 1

## 2024-04-19 MED ORDER — INSULIN ASPART 100 UNIT/ML IJ SOLN
0.0000 [IU] | INTRAMUSCULAR | Status: DC | PRN
  Administered 2024-04-19: 2 [IU] via SUBCUTANEOUS

## 2024-04-19 MED ORDER — VITAMIN D (ERGOCALCIFEROL) 1.25 MG (50000 UNIT) PO CAPS
50000.0000 [IU] | ORAL_CAPSULE | ORAL | Status: DC
  Administered 2024-04-20: 50000 [IU] via ORAL
  Filled 2024-04-19: qty 1

## 2024-04-19 MED ORDER — POVIDONE-IODINE 10 % EX SWAB: 2.0000 | Freq: Once | CUTANEOUS | Status: DC

## 2024-04-19 MED ORDER — ONDANSETRON HCL 4 MG/2ML IJ SOLN
INTRAMUSCULAR | Status: AC
  Filled 2024-04-19: qty 2

## 2024-04-19 MED ORDER — APIXABAN 5 MG PO TABS
5.0000 mg | ORAL_TABLET | Freq: Two times a day (BID) | ORAL | Status: DC
  Administered 2024-04-20: 5 mg via ORAL
  Filled 2024-04-19: qty 1

## 2024-04-19 MED ORDER — PHENYLEPHRINE HCL-NACL 20-0.9 MG/250ML-% IV SOLN
INTRAVENOUS | Status: DC | PRN
  Administered 2024-04-19: 25 ug/min via INTRAVENOUS

## 2024-04-19 MED ORDER — PROPOFOL 500 MG/50ML IV EMUL
INTRAVENOUS | Status: DC | PRN
  Administered 2024-04-19: 40 ug/kg/min via INTRAVENOUS

## 2024-04-19 MED ORDER — ONDANSETRON HCL 4 MG PO TABS: 4.0000 mg | ORAL_TABLET | Freq: Four times a day (QID) | ORAL | Status: DC | PRN

## 2024-04-19 MED ORDER — CEFAZOLIN SODIUM-DEXTROSE 2-4 GM/100ML-% IV SOLN
2.0000 g | Freq: Four times a day (QID) | INTRAVENOUS | Status: AC
  Administered 2024-04-19 (×2): 2 g via INTRAVENOUS
  Filled 2024-04-19 (×2): qty 100

## 2024-04-19 MED ORDER — ESCITALOPRAM OXALATE 20 MG PO TABS
20.0000 mg | ORAL_TABLET | Freq: Every day | ORAL | Status: DC
  Administered 2024-04-19 – 2024-04-20 (×2): 20 mg via ORAL
  Filled 2024-04-19 (×2): qty 1

## 2024-04-19 MED ORDER — PROPOFOL 1000 MG/100ML IV EMUL
INTRAVENOUS | Status: AC
Start: 2024-04-19 — End: 2024-04-19
  Filled 2024-04-19: qty 100

## 2024-04-19 MED ORDER — ROPINIROLE HCL 1 MG PO TABS
1.5000 mg | ORAL_TABLET | Freq: Every day | ORAL | Status: DC
  Administered 2024-04-19 – 2024-04-20 (×2): 1.5 mg via ORAL
  Filled 2024-04-19 (×2): qty 1

## 2024-04-19 MED ORDER — OXYCODONE HCL 5 MG PO TABS
5.0000 mg | ORAL_TABLET | ORAL | Status: DC | PRN
  Administered 2024-04-19 – 2024-04-20 (×3): 5 mg via ORAL
  Filled 2024-04-19 (×2): qty 1
  Filled 2024-04-19: qty 2

## 2024-04-19 MED ORDER — DEXAMETHASONE SODIUM PHOSPHATE 10 MG/ML IJ SOLN
INTRAMUSCULAR | Status: AC
Start: 2024-04-19 — End: 2024-04-19
  Filled 2024-04-19: qty 1

## 2024-04-19 MED ORDER — METOCLOPRAMIDE HCL 5 MG PO TABS: 5.0000 mg | ORAL_TABLET | Freq: Three times a day (TID) | ORAL | Status: DC | PRN

## 2024-04-19 MED ORDER — HYDROMORPHONE HCL 1 MG/ML IJ SOLN: 0.5000 mg | INTRAMUSCULAR | Status: DC | PRN

## 2024-04-19 MED ORDER — METOCLOPRAMIDE HCL 5 MG/ML IJ SOLN: 5.0000 mg | Freq: Three times a day (TID) | INTRAMUSCULAR | Status: DC | PRN

## 2024-04-19 MED ORDER — FENTANYL CITRATE PF 50 MCG/ML IJ SOSY: 25.0000 ug | PREFILLED_SYRINGE | INTRAMUSCULAR | Status: DC | PRN

## 2024-04-19 MED ORDER — ACETAMINOPHEN 10 MG/ML IV SOLN
INTRAVENOUS | Status: AC
  Filled 2024-04-19: qty 100

## 2024-04-19 MED ORDER — FENTANYL CITRATE (PF) 100 MCG/2ML IJ SOLN
INTRAMUSCULAR | Status: DC | PRN
  Administered 2024-04-19: 25 ug via INTRAVENOUS

## 2024-04-19 MED ORDER — FUROSEMIDE 40 MG PO TABS
40.0000 mg | ORAL_TABLET | Freq: Every day | ORAL | Status: DC
  Administered 2024-04-19 – 2024-04-20 (×2): 40 mg via ORAL
  Filled 2024-04-19 (×2): qty 1

## 2024-04-19 MED ORDER — PANTOPRAZOLE SODIUM 40 MG PO TBEC
40.0000 mg | DELAYED_RELEASE_TABLET | Freq: Every day | ORAL | Status: DC
  Administered 2024-04-20: 40 mg via ORAL
  Filled 2024-04-19: qty 1

## 2024-04-19 MED ORDER — SODIUM CHLORIDE 0.9 % IV SOLN: INTRAVENOUS | Status: DC

## 2024-04-19 MED ORDER — ACETAMINOPHEN 10 MG/ML IV SOLN
INTRAVENOUS | Status: DC | PRN
  Administered 2024-04-19: 1000 mg via INTRAVENOUS

## 2024-04-19 MED ORDER — ATORVASTATIN CALCIUM 40 MG PO TABS
80.0000 mg | ORAL_TABLET | Freq: Every evening | ORAL | Status: DC
  Administered 2024-04-19: 80 mg via ORAL
  Filled 2024-04-19: qty 2

## 2024-04-19 MED ORDER — BUPROPION HCL ER (XL) 300 MG PO TB24
450.0000 mg | ORAL_TABLET | Freq: Every day | ORAL | Status: DC
  Administered 2024-04-19 – 2024-04-20 (×2): 450 mg via ORAL
  Filled 2024-04-19 (×2): qty 1

## 2024-04-19 MED ORDER — OXYCODONE HCL 5 MG PO TABS: 5.0000 mg | ORAL_TABLET | Freq: Once | ORAL | Status: DC | PRN

## 2024-04-19 MED ORDER — EMPAGLIFLOZIN 10 MG PO TABS
10.0000 mg | ORAL_TABLET | Freq: Every day | ORAL | Status: DC
  Administered 2024-04-20: 10 mg via ORAL
  Filled 2024-04-19: qty 1

## 2024-04-19 MED ORDER — BUPIVACAINE IN DEXTROSE 0.75-8.25 % IT SOLN
INTRATHECAL | Status: DC | PRN
Start: 2024-04-19 — End: 2024-04-19
  Administered 2024-04-19: 2 mL via INTRATHECAL

## 2024-04-19 MED ORDER — INSULIN ASPART 100 UNIT/ML IJ SOLN
0.0000 [IU] | Freq: Three times a day (TID) | INTRAMUSCULAR | Status: DC
  Administered 2024-04-19: 3 [IU] via SUBCUTANEOUS
  Administered 2024-04-19: 8 [IU] via SUBCUTANEOUS
  Administered 2024-04-20: 3 [IU] via SUBCUTANEOUS

## 2024-04-19 MED ORDER — INSULIN ASPART 100 UNIT/ML IJ SOLN
INTRAMUSCULAR | Status: AC
  Filled 2024-04-19: qty 1

## 2024-04-19 MED ORDER — LACTATED RINGERS IV SOLN: INTRAVENOUS | Status: DC

## 2024-04-19 MED ORDER — PHENYLEPHRINE 80 MCG/ML (10ML) SYRINGE FOR IV PUSH (FOR BLOOD PRESSURE SUPPORT)
PREFILLED_SYRINGE | INTRAVENOUS | Status: AC
  Filled 2024-04-19: qty 10

## 2024-04-19 MED ORDER — MIDAZOLAM HCL 2 MG/2ML IJ SOLN
INTRAMUSCULAR | Status: AC
  Filled 2024-04-19: qty 2

## 2024-04-19 MED ORDER — ONDANSETRON HCL 4 MG/2ML IJ SOLN: 4.0000 mg | Freq: Four times a day (QID) | INTRAMUSCULAR | Status: DC | PRN

## 2024-04-19 MED ORDER — ACETAMINOPHEN 10 MG/ML IV SOLN: 1000.0000 mg | Freq: Once | INTRAVENOUS | Status: DC | PRN

## 2024-04-19 MED ORDER — ALBUTEROL SULFATE (2.5 MG/3ML) 0.083% IN NEBU: 2.5000 mg | INHALATION_SOLUTION | Freq: Four times a day (QID) | RESPIRATORY_TRACT | Status: DC | PRN

## 2024-04-19 MED ORDER — OXYCODONE HCL 5 MG/5ML PO SOLN: 5.0000 mg | Freq: Once | ORAL | Status: DC | PRN

## 2024-04-19 MED ORDER — DOCUSATE SODIUM 100 MG PO CAPS
100.0000 mg | ORAL_CAPSULE | Freq: Two times a day (BID) | ORAL | Status: DC
  Administered 2024-04-19 – 2024-04-20 (×2): 100 mg via ORAL
  Filled 2024-04-19 (×2): qty 1

## 2024-04-19 MED ORDER — PHENYLEPHRINE 80 MCG/ML (10ML) SYRINGE FOR IV PUSH (FOR BLOOD PRESSURE SUPPORT)
PREFILLED_SYRINGE | INTRAVENOUS | Status: DC | PRN
  Administered 2024-04-19: 80 ug via INTRAVENOUS

## 2024-04-19 MED ORDER — CEFAZOLIN SODIUM-DEXTROSE 2-4 GM/100ML-% IV SOLN
2.0000 g | INTRAVENOUS | Status: AC
  Administered 2024-04-19: 2 g via INTRAVENOUS
  Filled 2024-04-19: qty 100

## 2024-04-19 MED ORDER — OXYCODONE HCL 5 MG PO TABS: 10.0000 mg | ORAL_TABLET | ORAL | Status: DC | PRN

## 2024-04-19 MED ORDER — ROPINIROLE HCL 1 MG PO TABS
3.0000 mg | ORAL_TABLET | Freq: Every day | ORAL | Status: DC
  Administered 2024-04-19: 3 mg via ORAL
  Filled 2024-04-19: qty 3

## 2024-04-19 MED ORDER — VITAMIN C 500 MG PO TABS
500.0000 mg | ORAL_TABLET | Freq: Every day | ORAL | Status: DC
  Administered 2024-04-20: 500 mg via ORAL
  Filled 2024-04-19: qty 1

## 2024-04-19 MED ORDER — MENTHOL 3 MG MT LOZG: 1.0000 | LOZENGE | OROMUCOSAL | Status: DC | PRN

## 2024-04-19 MED ORDER — FENTANYL CITRATE (PF) 100 MCG/2ML IJ SOLN
INTRAMUSCULAR | Status: AC
  Filled 2024-04-19: qty 2

## 2024-04-19 MED ORDER — CHLORHEXIDINE GLUCONATE 0.12 % MT SOLN: 15.0000 mL | Freq: Once | OROMUCOSAL | Status: DC

## 2024-04-19 MED ORDER — METFORMIN HCL 500 MG PO TABS
500.0000 mg | ORAL_TABLET | Freq: Two times a day (BID) | ORAL | Status: DC
  Administered 2024-04-19 – 2024-04-20 (×2): 500 mg via ORAL
  Filled 2024-04-19 (×2): qty 1

## 2024-04-19 MED ORDER — DEXAMETHASONE SODIUM PHOSPHATE 10 MG/ML IJ SOLN
INTRAMUSCULAR | Status: DC | PRN
  Administered 2024-04-19: 5 mg via INTRAVENOUS

## 2024-04-19 MED ORDER — ACETAMINOPHEN 325 MG PO TABS
325.0000 mg | ORAL_TABLET | Freq: Four times a day (QID) | ORAL | Status: DC | PRN
  Administered 2024-04-20: 650 mg via ORAL
  Filled 2024-04-19: qty 2

## 2024-04-19 MED ORDER — TRANEXAMIC ACID-NACL 1000-0.7 MG/100ML-% IV SOLN
1000.0000 mg | INTRAVENOUS | Status: AC
  Administered 2024-04-19: 1000 mg via INTRAVENOUS
  Filled 2024-04-19: qty 100

## 2024-04-19 MED ORDER — INSULIN ASPART 100 UNIT/ML IJ SOLN
0.0000 [IU] | Freq: Every day | INTRAMUSCULAR | Status: DC
  Administered 2024-04-19: 2 [IU] via SUBCUTANEOUS

## 2024-04-19 MED ORDER — ORAL CARE MOUTH RINSE: 15.0000 mL | Freq: Once | OROMUCOSAL | Status: DC

## 2024-04-19 MED ORDER — MIDAZOLAM HCL 5 MG/5ML IJ SOLN
INTRAMUSCULAR | Status: DC | PRN
  Administered 2024-04-19: 2 mg via INTRAVENOUS

## 2024-04-19 MED ORDER — POLYETHYLENE GLYCOL 3350 17 G PO PACK: 17.0000 g | PACK | Freq: Every day | ORAL | Status: DC | PRN

## 2024-04-19 MED ORDER — ROPINIROLE HCL 3 MG PO TABS
ORAL_TABLET | ORAL | 1 refills | Status: DC
  Filled 2024-04-24: qty 45, 30d supply, fill #0
  Filled 2024-05-22: qty 45, 30d supply, fill #1

## 2024-04-19 MED ORDER — METOPROLOL TARTRATE 50 MG PO TABS
100.0000 mg | ORAL_TABLET | Freq: Two times a day (BID) | ORAL | Status: DC
  Administered 2024-04-19 – 2024-04-20 (×2): 100 mg via ORAL
  Filled 2024-04-19 (×2): qty 2

## 2024-04-19 MED ORDER — POTASSIUM CHLORIDE CRYS ER 20 MEQ PO TBCR
20.0000 meq | EXTENDED_RELEASE_TABLET | Freq: Every day | ORAL | Status: DC
  Administered 2024-04-20: 20 meq via ORAL
  Filled 2024-04-19: qty 1

## 2024-04-19 MED ORDER — TRAZODONE HCL 50 MG PO TABS
150.0000 mg | ORAL_TABLET | Freq: Every day | ORAL | Status: DC
  Administered 2024-04-19: 150 mg via ORAL
  Filled 2024-04-19: qty 1

## 2024-04-19 MED ORDER — STERILE WATER FOR IRRIGATION IR SOLN
Status: DC | PRN
  Administered 2024-04-19: 2000 mL

## 2024-04-19 SURGICAL SUPPLY — 38 items
BAG COUNTER SPONGE SURGICOUNT (BAG) ×1 IMPLANT
BAG ZIPLOCK 12X15 (MISCELLANEOUS) ×1 IMPLANT
BENZOIN TINCTURE PRP APPL 2/3 (GAUZE/BANDAGES/DRESSINGS) IMPLANT
BLADE SAW SGTL 18X1.27X75 (BLADE) ×1 IMPLANT
COVER PERINEAL POST (MISCELLANEOUS) ×1 IMPLANT
COVER SURGICAL LIGHT HANDLE (MISCELLANEOUS) ×1 IMPLANT
CUP ACETAB W/GRIPTION 54 (Plate) IMPLANT
DRAPE FOOT SWITCH (DRAPES) ×1 IMPLANT
DRAPE STERI IOBAN 125X83 (DRAPES) ×1 IMPLANT
DRAPE U-SHAPE 47X51 STRL (DRAPES) ×2 IMPLANT
DRESSING AQUACEL AG SP 3.5X10 (GAUZE/BANDAGES/DRESSINGS) IMPLANT
DRSG AQUACEL AG ADV 3.5X10 (GAUZE/BANDAGES/DRESSINGS) ×1 IMPLANT
DRSG XEROFORM 1X8 (GAUZE/BANDAGES/DRESSINGS) IMPLANT
DURAPREP 26ML APPLICATOR (WOUND CARE) ×1 IMPLANT
ELECT PENCIL ROCKER SW 15FT (MISCELLANEOUS) ×1 IMPLANT
ELECT REM PT RETURN 15FT ADLT (MISCELLANEOUS) ×1 IMPLANT
FEMORAL STEM 12/14 TPR SZ4 HIP (Orthopedic Implant) IMPLANT
GAUZE XEROFORM 1X8 LF (GAUZE/BANDAGES/DRESSINGS) IMPLANT
GLOVE BIO SURGEON STRL SZ7.5 (GLOVE) ×1 IMPLANT
GLOVE BIOGEL PI IND STRL 8 (GLOVE) ×2 IMPLANT
GLOVE ECLIPSE 8.0 STRL XLNG CF (GLOVE) ×1 IMPLANT
GOWN STRL REUS W/ TWL XL LVL3 (GOWN DISPOSABLE) ×2 IMPLANT
HEAD FEM CER ARTIC -2 36 12/14 (Head) IMPLANT
HOLDER FOLEY CATH W/STRAP (MISCELLANEOUS) ×1 IMPLANT
KIT TURNOVER KIT A (KITS) ×1 IMPLANT
LINER NEUTRAL 54X36MM PLUS 4 (Hips) IMPLANT
PACK ANTERIOR HIP CUSTOM (KITS) ×1 IMPLANT
SET HNDPC FAN SPRY TIP SCT (DISPOSABLE) ×1 IMPLANT
STAPLER SKIN PROX 35W (STAPLE) IMPLANT
STRIP CLOSURE SKIN 1/2X4 (GAUZE/BANDAGES/DRESSINGS) IMPLANT
SUT ETHIBOND NAB CT1 #1 30IN (SUTURE) ×1 IMPLANT
SUT ETHILON 2 0 PS N (SUTURE) IMPLANT
SUT MNCRL AB 4-0 PS2 18 (SUTURE) IMPLANT
SUT VIC AB 0 CT1 36 (SUTURE) ×1 IMPLANT
SUT VIC AB 1 CT1 36 (SUTURE) ×1 IMPLANT
SUT VIC AB 2-0 CT1 TAPERPNT 27 (SUTURE) ×2 IMPLANT
TRAY FOLEY MTR SLVR 16FR STAT (SET/KITS/TRAYS/PACK) ×1 IMPLANT
YANKAUER SUCT BULB TIP NO VENT (SUCTIONS) ×1 IMPLANT

## 2024-04-19 NOTE — Anesthesia Postprocedure Evaluation (Signed)
 Anesthesia Post Note  Patient: Shaun [name]  Procedure(s) Performed: ARTHROPLASTY, HIP, TOTAL, ANTERIOR APPROACH (Right: Hip)     Patient location during evaluation: PACU Anesthesia Type: MAC and Spinal Level of consciousness: awake and alert Pain management: pain level controlled Vital Signs Assessment: post-procedure vital signs reviewed and stable Respiratory status: spontaneous breathing, nonlabored ventilation and respiratory function stable Cardiovascular status: stable and blood pressure returned to baseline Postop Assessment: no apparent nausea or vomiting Anesthetic complications: no   No notable events documented.                Areana Kosanke

## 2024-04-19 NOTE — Op Note (Signed)
 Operative Note  Date of operation: 04/19/2024 Preoperative diagnosis: Right hip primary osteoarthritis Postoperative diagnosis: Same  Procedure: Right direct anterior total hip arthroplasty  Implants: Implant Name Type Inv. Item Serial No. Manufacturer Lot No. LRB No. Used Action  CUP ACETAB W/GRIPTION 54 - [id number] Plate CUP ACETAB W/GRIPTION 54  [employer]PU[employer] [id number] Right 1 Implanted  LINER NEUTRAL 54X36MM PLUS 4 - [id number] Hips LINER NEUTRAL 54X36MM PLUS 4  DEPUY ORTH[employer] [id number] Right 1 Implanted  FEMORAL STEM 12/14 TPR SZ4 HIP - [id number] Orthopedic Implant FEMORAL STEM 12/14 TPR SZ4 HIP  [employer]PU[employer]TH[employer] [id number] Right 1 Implanted  HEAD FEM CER ARTIC -2 36 12/14 - [id number] Head HEAD FEM CER ARTIC -2 36 12/14  [employer]PU[employer]TH[employer] [id number] Right 1 Implanted   Surgeon: Lonni GRADE. Vernetta, MD Assistant: Jon Hurst, RNFA  Anesthesia: Spinal EBL: 100 cc Antibiotics: IV Ancef  Complications: None  Indications: The patient is a 68 year old active gentleman with debilitating arthritis involving his right hip this been well-documented with clinical exam and x-ray findings.  He is at the point to where he does wish to proceed with a total hip arthroplasty given the failure of conservative treatment for a while now.  His right hip pain is daily and it is detrimentally effecting his mobility, his quality of life and his actives a day living to the point he does wish to pursue the hip replacement and we agree with this as well.  We did discuss in length in detail the risks of acute blood loss anemia, neurovascular injury, fracture, infection, DVT, implant failure, leg length differences, dislocation, and wound healing issues.  He understands that our goals are hopefully decreased pain, improved mobility and improve quality of life.  Procedure description: After informed consent was obtained and the appropriate right hip was marked, the patient was brought to the operating  room and set up on the stretcher where spinal anesthesia was obtained.  He was then laid in supine position on stretcher and a Foley catheter was placed.  Traction boots were next placed on both his feet and he was placed supine on the Hana fracture table with a perineal post and placed in both legs and in line skeletal traction vices with no traction applied.  His right operative hip and pelvis were assessed radiographically.  The right hip was prepped and draped with DuraPrep and sterile drapes.  A timeout was called and he was identified as the correct patient and the correct right hip.  An incision was then made just inferior and posterior to the ASIS and carried slightly obliquely down the leg.  Dissection was carried down to the tensor fascia lata muscle and the tensor fascia was then divided longitudinally to proceed with a direct interposed the hip.  Circumflex vessels were identified and cauterized.  The hip capsule identified and opened up in L-type format.  Cobra retractors were then placed around the medial and lateral femoral neck and a femoral neck cut was made with an oscillating saw just proximal to the lesser trochanter and this cut was completed with an osteotome.  A corkscrew guide was placed in the femoral head and the femoral head was removed in its entirety and there was a wide area devoid of cartilage.  A bent Hohmann was then placed over the medial acetabular rim and remanence of the acetabular labrum and other debris removed.  Reaming was then initiated from a size 43 reamer and stepwise increments going up to a size 53 reamer with all reamers  placed under direct visualization and the last reamer also placed under direct fluoroscopy in order to obtain the depth and reaming, the inclination and the anteversion.  The real DePuy sector GRIPTION acetabular component size 54 was then placed without difficulty followed by a 36+4 polythene liner.  Attention was then turned to the femur.  With the  right leg externally rotated to 120 degrees, extended and adducted, a Mueller retractor was placed medially and Hohmann tractor by the greater trochanter.  The lateral joint capsule was released and a box cutting osteotome was used into the femoral canal.  Broaching was then initiated using the Actis broaching system from a size 0 going to a size 4.  Based on his anatomy with a size 4 in place we trialed a standard offset femoral neck and a 36+1.5 trial head ball.  The right leg was brought over and up and with traction and internal rotation reduced in the pelvis and was very tight and we had increased his leg lengths too much but needed more offset.  We dislocated the hip and the trial components.  We decided to go with the real high offset femoral neck stem size 4 and we went with a 36-2 ceramic head ball.  This reduced the pelvis and we are pleased the range of motion as well as leg length and offset and stability assessed radiographically and mechanically.  The soft tissue was then irrigated with normal saline solution.  Remnants of the joint capsule were closed with interrupted #1 Ethibond suture followed by #1 Vicryl to close the tensor fascia.  0 Vicryl was used to close deep tissue and 2-0 Vicryl was used to close subcutaneous tissue.  The skin was closed with staples.  Xeroform and Aquacel dressing were applied.  The patient was taken off the Hana table and taken recovery room.  Jon Hurst, RNFA assisted throughout the case and beginning to end her assistance was crucial and medically necessary for soft tissue management and retraction, helping guide implant placement and a layered closure of the wound.

## 2024-04-19 NOTE — Interval H&P Note (Signed)
 History and Physical Interval Note: The patient understands that he is here today for a right total hip replacement to treat his significant right hip pain and arthritis.  There has been no acute or interval changes in medical status.  The risks and benefits of surgery have been discussed in detail and informed consent has been obtained.  The right operative hip has been marked.  04/19/2024 6:57 AM  Shaun [name]  has presented today for surgery, with the diagnosis of osteoarthritis right hip.  The various methods of treatment have been discussed with the patient and family. After consideration of risks, benefits and other options for treatment, the patient has consented to  Procedure(s) with comments: ARTHROPLASTY, HIP, TOTAL, ANTERIOR APPROACH (Right) - Needs RNFA as a surgical intervention.  The patient's history has been reviewed, patient examined, no change in status, stable for surgery.  I have reviewed the patient's chart and labs.  Questions were answered to the patient's satisfaction.     [name]

## 2024-04-19 NOTE — Anesthesia Procedure Notes (Signed)
 Procedure Name: MAC Date/Time: 04/19/2024 7:12 AM  Performed by: Memory Armida LABOR, CRNAPre-anesthesia Checklist: Patient identified, Emergency Drugs available, Suction available, Patient being monitored and Timeout performed Patient Re-evaluated:Patient Re-evaluated prior to induction Oxygen Delivery Method: Simple face mask Placement Confirmation: CO2 detector Dental Injury: Teeth and Oropharynx as per pre-operative assessment

## 2024-04-19 NOTE — Evaluation (Signed)
 Physical Therapy Evaluation Patient Details Name: Shaun [name] MRN: [id number] DOB: 04/05/1956 Today's Date: 04/19/2024  History of Present Illness  68 yo male presents to therapy s/p R THA, anterior approach on 04/19/2024 due to failure of conservative measures. Pt PMH includes but is not limited to: GERD, tremors, SBO s/p bowel resection, depression, sundowning, hernia s/p repair, HLD, RLS, dCFH, asthma, COPD, CAD, DM II, CAD s/p angioplasty, OSA, A-fib, tobacco abuse and diverticulitis.  Clinical Impression    Shaun [name] is a 68 y.o. male POD 0 s/p R THA, AA. Patient reports mod I with mobility at baseline. Patient is now limited by functional impairments (see PT problem list below) and requires min A for bed mobility and CGA for transfers. Patient was able to ambulate 65 feet with RW and CGA level of assist. Patient will benefit from continued skilled PT interventions to address impairments and progress towards PLOF. Acute PT will follow to progress mobility and stair training in preparation for safe discharge home with family support and [facility] services.       If plan is discharge home, recommend the following: A little help with walking and/or transfers;A little help with bathing/dressing/bathroom;Assistance with cooking/housework;Assist for transportation;Help with stairs or ramp for entrance   Can travel by private vehicle        Equipment Recommendations None recommended by PT  Recommendations for Other Services       Functional Status Assessment Patient has had a recent decline in their functional status and demonstrates the ability to make significant improvements in function in a reasonable and predictable amount of time.     Precautions / Restrictions Precautions Precautions: Fall Restrictions Weight Bearing Restrictions Per Provider Order: No      Mobility  Bed Mobility Overal bed mobility: Needs Assistance Bed Mobility: Supine to Sit     Supine to sit:  Min assist, HOB elevated, Used rails     General bed mobility comments: cues and min A for R LE and trunk able to scoot to EOB CGA    Transfers Overall transfer level: Needs assistance Equipment used: Rolling walker (2 wheels) Transfers: Sit to/from Stand Sit to Stand: Contact guard assist           General transfer comment: min cues pt able to push with L UE to stand at RW    Ambulation/Gait Ambulation/Gait assistance: Contact guard assist Gait Distance (Feet): 65 Feet Assistive device: Rolling walker (2 wheels) Gait Pattern/deviations: Step-to pattern, Decreased stance time - right, Antalgic, Trunk flexed Gait velocity: decreased     General Gait Details: slight trunk flexion with B UE support at RW to offload R LE in stance phase, min cues for safety and turns  Careers information officer     Tilt Bed    Modified Rankin (Stroke Patients Only)       Balance Overall balance assessment: Needs assistance Sitting-balance support: Feet supported Sitting balance-Leahy Scale: Good     Standing balance support: Bilateral upper extremity supported, During functional activity, Reliant on assistive device for balance Standing balance-Leahy Scale: Poor                               Pertinent Vitals/Pain Pain Assessment Pain Assessment: 0-10 Pain Score: 4  Pain Location: R hip and LE Pain Descriptors / Indicators: Aching, Constant, Discomfort, Dull, Operative site guarding, Grimacing Pain Intervention(s): Limited  activity within patient's tolerance, Monitored during session, Premedicated before session, Repositioned, Ice applied    Home Living Family/patient expects to be discharged to:: Private residence Living Arrangements: Spouse/significant other;Other relatives Available Help at Discharge: Family Type of Home: House Home Access: Stairs to enter Entrance Stairs-Rails: None Entrance Stairs-Number of Steps: 1   Home Layout: One  level Home Equipment: Agricultural consultant (2 wheels);Cane - single point;Shower seat (hip kit)      Prior Function Prior Level of Function : Independent/Modified Independent             Mobility Comments: pt reports occational use of SPC due to R hip pain, IND for all ADLs, self care tasks and IADLs       Extremity/Trunk Assessment        Lower Extremity Assessment Lower Extremity Assessment: RLE deficits/detail RLE Deficits / Details: ankle DF/PF 5/5 RLE Sensation: WNL    Cervical / Trunk Assessment Cervical / Trunk Assessment: Normal  Communication   Communication Communication: No apparent difficulties    Cognition Arousal: Alert Behavior During Therapy: WFL for tasks assessed/performed   PT - Cognitive impairments: No apparent impairments                         Following commands: Intact       Cueing       General Comments      Exercises     Assessment/Plan    PT Assessment Patient needs continued PT services  PT Problem List Decreased strength;Decreased range of motion;Decreased activity tolerance;Decreased balance;Decreased mobility;Decreased coordination;Pain       PT Treatment Interventions DME instruction;Gait training;Stair training;Therapeutic activities;Functional mobility training;Therapeutic exercise;Balance training;Neuromuscular re-education;Patient/family education;Modalities    PT Goals (Current goals can be found in the Care Plan section)  Acute Rehab PT Goals Patient Stated Goal: to get back to doing what every I want even climbing a ladder PT Goal Formulation: With patient Time For Goal Achievement: 05/10/24 Potential to Achieve Goals: Good    Frequency 7X/week     Co-evaluation               AM-PAC PT 6 Clicks Mobility  Outcome Measure Help needed turning from your back to your side while in a flat bed without using bedrails?: None Help needed moving from lying on your back to sitting on the side of a flat  bed without using bedrails?: A Little Help needed moving to and from a bed to a chair (including a wheelchair)?: A Little Help needed standing up from a chair using your arms (e.g., wheelchair or bedside chair)?: A Little Help needed to walk in hospital room?: A Little Help needed climbing 3-5 steps with a railing? : A Lot 6 Click Score: 18    End of Session Equipment Utilized During Treatment: Gait belt Activity Tolerance: Patient tolerated treatment well Patient left: in chair;with call bell/phone within reach;with family/visitor present;with chair alarm set Nurse Communication: Mobility status PT Visit Diagnosis: Unsteadiness on feet (R26.81);Other abnormalities of gait and mobility (R26.89);Muscle weakness (generalized) (M62.81);Difficulty in walking, not elsewhere classified (R26.2);Pain Pain - Right/Left: Right Pain - part of body: Hip;Leg    Time: 8291-8266 PT Time Calculation (min) (ACUTE ONLY): 25 min   Charges:   PT Evaluation $PT Eval Low Complexity: 1 Low PT Treatments $Gait Training: 8-22 mins PT General Charges $$ ACUTE PT VISIT: 1 Visit         [place], PT Acute Rehab   [place] VEAR Drone 04/19/2024,  6:47 PM

## 2024-04-19 NOTE — Transfer of Care (Signed)
 Immediate Anesthesia Transfer of Care Note  Patient: Shaun [name]  Procedure(s) Performed: ARTHROPLASTY, HIP, TOTAL, ANTERIOR APPROACH (Right: Hip)  Patient Location: PACU  Anesthesia Type:MAC and Spinal  Level of Consciousness: awake, alert , oriented, and patient cooperative  Airway & Oxygen Therapy: Patient Spontanous Breathing and Patient connected to face mask oxygen  Post-op Assessment: Report given to RN and Post -op Vital signs reviewed and stable  Post vital signs: Reviewed and stable  Last Vitals:  Vitals Value Taken Time  BP    Temp    Pulse    Resp    SpO2      Last Pain:  Vitals:   04/19/24 0555  TempSrc: Oral  PainSc:          Complications: No notable events documented.

## 2024-04-19 NOTE — TOC Transition Note (Signed)
 Transition of Care [facility]) - Discharge Note   Patient Details  Name: Shaun [name] MRN: [id number] Date of Birth: 15-Feb-1956  Transition of [facility]) CM/SW Contact:  NORMAN ASPEN, LCSW Phone Number: 04/19/2024, 1:25 PM   Clinical Narrative:    Met with pt/ spouse who confirm he has needed DME in the home.  HHPT prearranged with [facility] via ortho MD office prior to surgery.  No further TOC needs.   Final next level of care: Home w Home Health Services Barriers to Discharge: No Barriers Identified   Patient Goals and CMS Choice Patient states their goals for this hospitalization and ongoing recovery are:: return home          Discharge Placement                       Discharge Plan and Services Additional resources added to the After Visit Summary for                  DME Arranged: N/A DME Agency: NA       HH Arranged: PT HH Agency: [facility]        Social Drivers of Health (SDOH) Interventions SDOH Screenings   Food Insecurity: No Food Insecurity (04/19/2024)  Housing: Low Risk  (04/19/2024)  Transportation Needs: No Transportation Needs (04/19/2024)  Utilities: Not At Risk (04/19/2024)  Alcohol Screen: Low Risk  (05/09/2023)  Depression (PHQ2-9): Low Risk  (12/05/2023)  Financial Resource Strain: Low Risk  (12/05/2023)  Physical Activity: Insufficiently Active (12/05/2023)  Social Connections: Moderately Isolated (04/19/2024)  Stress: Stress Concern Present (12/05/2023)  Tobacco Use: High Risk (04/19/2024)  Health Literacy: Adequate Health Literacy (05/09/2023)     Readmission Risk Interventions    08/17/2021    2:33 PM 08/09/2021    8:31 AM  Readmission Risk Prevention Plan  Transportation Screening Complete Complete  PCP or Specialist Appt within 3-5 Days Complete   HRI or Home Care Consult Complete Complete  Social Work Consult for Recovery Care Planning/Counseling Complete Complete  Palliative Care Screening Not Applicable Not  Applicable  Medication Review Oceanographer) Complete Complete

## 2024-04-19 NOTE — Anesthesia Procedure Notes (Addendum)
 Spinal  Start time: 04/19/2024 7:15 AM End time: 04/19/2024 7:20 AM Reason for block: surgical anesthesia Staffing Performed: resident/CRNA  Resident/CRNA: Memory Armida LABOR, CRNA Performed by: Memory Armida LABOR, CRNA Authorized by: Leopoldo Bruckner, MD   Preanesthetic Checklist Completed: patient identified, IV checked, site marked, risks and benefits discussed, surgical consent, monitors and equipment checked, pre-op evaluation and timeout performed Spinal Block Patient position: sitting Prep: DuraPrep and site prepped and draped Patient monitoring: heart rate, cardiac monitor, continuous pulse ox and blood pressure Approach: midline Location: L2-3 Injection technique: single-shot Needle Needle type: Pencan  Needle gauge: 24 G Needle length: 10 cm Assessment Sensory level: T6 Events: CSF return Additional Notes Spinal kit expiration date checked and verified. Lot #[id number] Pt placed in sitting position. Monitors on and pt placed on O2 via FM. Sterile prep and drape of back. Local applied to injection site. One attempt by CRNA. + clear free flowing CSF obtained. - heme. Local injected with ease.Pt placed supine after procedure.

## 2024-04-19 NOTE — Progress Notes (Signed)
   04/19/24 2304  BiPAP/CPAP/SIPAP  $ Non-Invasive Home Ventilator  Initial  BiPAP/CPAP/SIPAP Pt Type Adult  BiPAP/CPAP/SIPAP Resmed  Mask Type Full face mask  Dentures removed? Not applicable  Mask Size Large  FiO2 (%) 21 %  Patient Home Machine No  Patient Home Mask Yes  Patient Home Tubing Yes  Auto Titrate Yes  Minimum cmH2O 5 cmH2O  Maximum cmH2O 20 cmH2O  CPAP/SIPAP surface wiped down Yes  Device Plugged into RED Power Outlet Yes

## 2024-04-20 ENCOUNTER — Other Ambulatory Visit: Payer: Self-pay | Admitting: Orthopaedic Surgery

## 2024-04-20 ENCOUNTER — Other Ambulatory Visit (HOSPITAL_BASED_OUTPATIENT_CLINIC_OR_DEPARTMENT_OTHER): Payer: Self-pay

## 2024-04-20 DIAGNOSIS — Z96641 Presence of right artificial hip joint: Secondary | ICD-10-CM

## 2024-04-20 DIAGNOSIS — M1611 Unilateral primary osteoarthritis, right hip: Secondary | ICD-10-CM | POA: Diagnosis not present

## 2024-04-20 LAB — CBC
HCT: 43.1 % (ref 39.0–52.0)
Hemoglobin: 13.7 g/dL (ref 13.0–17.0)
MCH: 27.5 pg (ref 26.0–34.0)
MCHC: 31.8 g/dL (ref 30.0–36.0)
MCV: 86.5 fL (ref 80.0–100.0)
Platelets: 141 K/uL — ABNORMAL LOW (ref 150–400)
RBC: 4.98 MIL/uL (ref 4.22–5.81)
RDW: 13.3 % (ref 11.5–15.5)
WBC: 14.9 K/uL — ABNORMAL HIGH (ref 4.0–10.5)
nRBC: 0 % (ref 0.0–0.2)

## 2024-04-20 LAB — BASIC METABOLIC PANEL WITH GFR
Anion gap: 8 (ref 5–15)
BUN: 21 mg/dL (ref 8–23)
CO2: 23 mmol/L (ref 22–32)
Calcium: 8.2 mg/dL — ABNORMAL LOW (ref 8.9–10.3)
Chloride: 103 mmol/L (ref 98–111)
Creatinine, Ser: 0.86 mg/dL (ref 0.61–1.24)
GFR, Estimated: >60 mL/min (ref >60)
Glucose, Bld: 193 mg/dL — ABNORMAL HIGH (ref 70–99)
Potassium: 4.1 mmol/L (ref 3.5–5.1)
Sodium: 134 mmol/L — ABNORMAL LOW (ref 135–145)

## 2024-04-20 LAB — GLUCOSE, CAPILLARY: Glucose-Capillary: 155 mg/dL — ABNORMAL HIGH (ref 70–99)

## 2024-04-20 MED ORDER — OXYCODONE HCL 5 MG PO TABS
5.0000 mg | ORAL_TABLET | Freq: Four times a day (QID) | ORAL | 0 refills | Status: DC | PRN
  Filled 2024-04-20: qty 30, 4d supply, fill #0

## 2024-04-20 MED ORDER — TIZANIDINE HCL 4 MG PO TABS
4.0000 mg | ORAL_TABLET | Freq: Four times a day (QID) | ORAL | 0 refills | Status: DC | PRN
  Filled 2024-04-20: qty 30, 8d supply, fill #0

## 2024-04-20 NOTE — Discharge Summary (Signed)
 Patient ID: Shaun [name] MRN: [id number] DOB/AGE: 1956/08/21 69 y.o.  Admit date: 04/19/2024 Discharge date: 04/20/2024  Admission Diagnoses:  Principal Problem:   Unilateral primary osteoarthritis, right hip Active Problems:   S/P total right hip arthroplasty   Status post total replacement of right hip   Discharge Diagnoses:  Same  Past Medical History:  Diagnosis Date   A-fib (HCC)    Anginal pain (HCC)    Anxiety    Bacterial URI 08/29/2022   CAD (coronary artery disease)    DES to distal circumflex 2016   Cataract    CHF (congestive heart failure) (HCC)    Colon polyps    30 colon polyps found on first colonoscopy   Complication of anesthesia    COPD (chronic obstructive pulmonary disease) (HCC)    Depression    Diastolic heart failure (HCC)    Diverticulitis    DJD (degenerative joint disease)    Dyspnea    Dysrhythmia    A-Fib   Genetic testing 09/09/2014   Negative genetic testing on the ColoNext panel test and the MSH2 inversion testing.  The ColoNext gene panel offered by [facility] and includes sequencing and rearrangement analysis for the following 17 genes: APC, BMPR1A, CDH1, CHEK2, EPCAM, GREM1, MLH1, MSH2, MSH6, MUTYH, PMS2, POLD1, POLE, PTEN, SMAD4, STK11, and TP53.   The report date is 09/08/14.      GERD (gastroesophageal reflux disease)    History of kidney stones    Hyperlipidemia    Hypernatremia 08/22/2021   Hypertension    Insomnia    Obstructive sleep apnea 12/2009   01/26/2010 AHI 83/hr   Permanent atrial fibrillation (HCC)    Onset 2006 paroxysmal then progressive to persistent   Pneumonia    PONV (postoperative nausea and vomiting)    PUD (peptic ulcer disease)    1980s   RLS (restless legs syndrome)    Sinusitis    Skin cancer    Tremor    Type 2 diabetes mellitus (HCC)     Surgeries: Procedure(s): ARTHROPLASTY, HIP, TOTAL, ANTERIOR APPROACH on 04/19/2024   Consultants:   Discharged Condition: Improved  Hospital  Course: Shaun [name] is an 68 y.o. male who was admitted 04/19/2024 for operative treatment ofUnilateral primary osteoarthritis, right hip. Patient has severe unremitting pain that affects sleep, daily activities, and work/hobbies. After pre-op clearance the patient was taken to the operating room on 04/19/2024 and underwent  Procedure(s): ARTHROPLASTY, HIP, TOTAL, ANTERIOR APPROACH.    Patient was given perioperative antibiotics:  Anti-infectives (From admission, onward)    Start     Dose/Rate Route Frequency Ordered Stop   04/19/24 1330  ceFAZolin  (ANCEF ) IVPB 2g/100 mL premix        2 g 200 mL/hr over 30 Minutes Intravenous Every 6 hours 04/19/24 1030 04/19/24 2150   04/19/24 0600  ceFAZolin  (ANCEF ) IVPB 2g/100 mL premix        2 g 200 mL/hr over 30 Minutes Intravenous On call to O.R. 04/19/24 9470 04/19/24 0751        Patient was given sequential compression devices, early ambulation, and chemoprophylaxis to prevent DVT.  Inpatient Morphine  Milligram Equivalents Per Day 8/22 - 8/23   Values displayed are in units of MME/Day    Order Start / End Date Yesterday Today    oxyCODONE  (Oxy IR/ROXICODONE ) immediate release tablet 5 mg 8/22 - 8/22 0 of Unknown --    oxyCODONE  (ROXICODONE ) 5 MG/5ML solution 5 mg 8/22 - 8/22 0 of Unknown --  Group total: 0 of Unknown     fentaNYL  (SUBLIMAZE ) injection 25-50 mcg 8/22 - 8/22 0 of 45-90 --    fentaNYL  (SUBLIMAZE ) injection 8/22 - 8/22 *7.5 of 7.5 --    Daily Totals  * 7.5 of Unknown (at least 52.5-97.5) --  *One-Step medication  Calculation Errors     Order Type Date Details   oxyCODONE  (Oxy IR/ROXICODONE ) immediate release tablet 5 mg Ordered Dose -- Insufficient frequency information   oxyCODONE  (ROXICODONE ) 5 MG/5ML solution 5 mg Ordered Dose -- Insufficient frequency information            Patient benefited maximally from hospital stay and there were no complications.    Recent vital signs: Patient Vitals for the past 24  hrs:  BP Temp Temp src Pulse Resp SpO2  04/20/24 0855 121/81 98.1 F (36.7 C) -- 81 15 93 %  04/20/24 0622 116/69 98.1 F (36.7 C) Oral 100 -- 97 %  04/20/24 0155 103/66 98.2 F (36.8 C) Axillary 81 -- 91 %  04/19/24 2252 -- -- -- -- 16 --  04/19/24 2200 103/65 98.4 F (36.9 C) Oral 85 -- 92 %  04/19/24 1359 105/86 97.9 F (36.6 C) Oral 81 18 93 %     Recent laboratory studies:  Recent Labs    04/20/24 0333  WBC 14.9*  HGB 13.7  HCT 43.1  PLT 141*  NA 134*  K 4.1  CL 103  CO2 23  BUN 21  CREATININE 0.86  GLUCOSE 193*  CALCIUM  8.2*     Discharge Medications:   Allergies as of 04/20/2024   No Known Allergies      Medication List     TAKE these medications    albuterol  108 (90 Base) MCG/ACT inhaler Commonly known as: Ventolin  HFA INHALE 2 PUFFS INTO THE LUNGS EVERY 6 HOURS AS NEEDED FOR WHEEZING OR SHORTNESS OF BREATH   albuterol  (2.5 MG/3ML) 0.083% nebulizer solution Commonly known as: PROVENTIL  Take 3 mLs (2.5 mg total) by nebulization every 6 (six) hours as needed for wheezing or shortness of breath.   ascorbic acid  500 MG tablet Commonly known as: VITAMIN C  Take 500 mg by mouth daily.   atorvastatin  80 MG tablet Commonly known as: LIPITOR  Take 1 tablet (80 mg total) by mouth once daily in the evening.  Please follow up in 2-3 months. (Take 1 tablet (80 mg total) by mouth every evening.)   buPROPion  150 MG 24 hr tablet Commonly known as: Wellbutrin  XL Take 3 tablets by mouth once daily. (Take 3 tablets (450 mg total) by mouth daily.)   Coricidin D Cold/Flu/Sinus 2-5-325 MG Tabs Generic drug: Chlorphen-Phenyleph-APAP Take 1 tablet by mouth every 6 (six) hours as needed (Cold/Cough).   docusate sodium  100 MG capsule Commonly known as: COLACE Take 1 capsule (100 mg total) by mouth daily as needed for mild constipation.   Eliquis  5 MG Tabs tablet Generic drug: apixaban  Take 1 tablet (5 mg total) by mouth 2 (two) times daily.   escitalopram  20  MG tablet Commonly known as: LEXAPRO  Take 20 mg by mouth in the morning.   ezetimibe  10 MG tablet Commonly known as: ZETIA  Take 1 tablet (10 mg total) by mouth daily.   FeroSul 325 (65 Fe) MG tablet Generic drug: ferrous sulfate  Take 1 tablet (325 mg total) by mouth 3 (three) times a week.   furosemide  40 MG tablet Commonly known as: LASIX  Take 1 tablet (40mg ) daily. May take 1/2 tablet (20mg ) additional if shortness of  breath or swelling occurs.   hydrOXYzine  25 MG tablet Commonly known as: ATARAX  Take 1 tablet (25 mg total) by mouth at bedtime and may repeat dose one time if needed for sleep. What changed:  how much to take when to take this reasons to take this   Jardiance  10 MG Tabs tablet Generic drug: empagliflozin  Take 10 mg by mouth daily. Through [employer] PAP   metFORMIN  500 MG tablet Commonly known as: GLUCOPHAGE  Take 1 tablet (500 mg total) by mouth 2 (two) times daily before a meal.   methimazole  5 MG tablet Commonly known as: TAPAZOLE  Take 1 tablet (5 mg total) by mouth as directed. 1 tablet 4 days a week   metoprolol  tartrate 100 MG tablet Commonly known as: LOPRESSOR  Take 1 tablet (100 mg total) by mouth 2 (two) times daily.   nitroGLYCERIN  0.4 MG SL tablet Commonly known as: NITROSTAT  Place 1 tablet (0.4 mg total) under the tongue every 5 (five) minutes as needed.   oxyCODONE  5 MG immediate release tablet Commonly known as: Oxy IR/ROXICODONE  Take 1-2 tablets (5-10 mg total) by mouth every 6 (six) hours as needed for moderate pain (pain score 4-6) (pain score 4-6).   pantoprazole  40 MG tablet Commonly known as: PROTONIX  Take 1 tablet (40 mg total) by mouth daily before breakfast.   PEG 3350  17 GM/SCOOP Powd Take one capful (17g) twice daily until soft stool, then continue one capful once daily as needed. What changed:  how much to take how to take this when to take this reasons to take this additional instructions   potassium chloride  SA 20  MEQ tablet Commonly known as: KLOR-CON  M Take 1 tablet (20 mEq total) by mouth daily.   rOPINIRole  3 MG tablet Commonly known as: REQUIP  Take 1 tablet (3 mg total) by mouth at bedtime, AND 0.5 tablets (1.5 mg total) during the day for breakthrough symptoms.   sildenafil  100 MG tablet Commonly known as: VIAGRA  Take 1 tablet (100 mg total) by mouth as needed for erectile dysfunction. Do not take nitroglycerine within 24 hours of last dose.   tiZANidine  4 MG tablet Commonly known as: ZANAFLEX  Take 1 tablet (4 mg total) by mouth every 6 (six) hours as needed for muscle spasms.   traZODone  150 MG tablet Commonly known as: DESYREL  Take 1 tablet (150 mg total) by mouth at bedtime.   Vitamin D  (Ergocalciferol ) 1.25 MG (50000 UNIT) Caps capsule Commonly known as: DRISDOL  Take 1 capsule (50,000 Units total) by mouth every 7 (seven) days.               Durable Medical Equipment  (From admission, onward)           Start     Ordered   04/19/24 1031  DME 3 n 1  Once        04/19/24 1030   04/19/24 1031  DME Walker rolling  Once       Question Answer Comment  Walker: With 5 Inch Wheels   Patient needs a walker to treat with the following condition Status post total replacement of right hip      04/19/24 1030            Diagnostic Studies: DG Pelvis Portable Result Date: 04/19/2024 CLINICAL DATA:  Postop. EXAM: PORTABLE PELVIS 1-2 VIEWS COMPARISON:  None Available. FINDINGS: Right hip arthroplasty in expected alignment. No periprosthetic lucency or fracture. Recent postsurgical change includes air and edema in the soft tissues. Overlying skin staples in  place. IMPRESSION: Right hip arthroplasty without immediate postoperative complication. Electronically Signed   By: Andrea Gasman M.D.   On: 04/19/2024 10:06   DG HIP UNILAT WITH PELVIS 1V RIGHT Result Date: 04/19/2024 CLINICAL DATA:  Elective surgery. EXAM: DG HIP (WITH OR WITHOUT PELVIS) 1V RIGHT COMPARISON:  None  Available. FINDINGS: Three fluoroscopic spot views of the pelvis and right hip obtained in the operating room. Images during hip arthroplasty. Fluoroscopy time 15 seconds. Dose 2.8904 mGy. IMPRESSION: Intraoperative fluoroscopy during right hip arthroplasty. Electronically Signed   By: Andrea Gasman M.D.   On: 04/19/2024 10:06   DG C-Arm 1-60 Min-No Report Result Date: 04/19/2024 Fluoroscopy was utilized by the requesting physician.  No radiographic interpretation.    Disposition: Discharge disposition: 01-Home or Self Care          Follow-up Information     [facility] Follow up.   Specialty: Home Health Services Why: to provide home physical therapy visits Contact information: [address] [place] [address] [phone]         Vernetta Lonni GRADE, MD Follow up in 2 week(s).   Specialty: Orthopedic Surgery Contact information: [address] [place]  [place] [address] [phone]                  Signed: Lonni GRADE Vernetta 04/20/2024, 12:03 PM

## 2024-04-20 NOTE — Discharge Instructions (Signed)
 Per [facility] clinic policy, our goal is ensure optimal postoperative pain control with a multimodal pain management strategy. For all [facility] patients, our goal is to wean post-operative narcotic medications by 6 weeks post-operatively. If this is not possible due to utilization of pain medication prior to surgery, your [facility] doctor will support your acute post-operative pain control for the first 6 weeks postoperatively, with a plan to transition you back to your primary pain team following that. Mar[facility]e will work to ensure a Therapist, occupational.  INSTRUCTIONS AFTER JOINT REPLACEMENT   Remove items at home which could result in a fall. This includes throw rugs or furniture in walking pathways ICE to the affected joint every three hours while awake for 30 minutes at a time, for at least the first 3-5 days, and then as needed for pain and swelling.  Continue to use ice for pain and swelling. You may notice swelling that will progress down to the foot and ankle.  This is normal after surgery.  Elevate your leg when you are not up walking on it.   Continue to use the breathing machine you got in the hospital (incentive spirometer) which will help keep your temperature down.  It is common for your temperature to cycle up and down following surgery, especially at night when you are not up moving around and exerting yourself.  The breathing machine keeps your lungs expanded and your temperature down.   DIET:  As you were doing prior to hospitalization, we recommend a well-balanced diet.  DRESSING / WOUND CARE / SHOWERING  Keep the surgical dressing until follow up.  The dressing is water  proof, so you can shower without any extra covering.  IF THE DRESSING FALLS OFF or the wound gets wet inside, change the dressing with sterile gauze.  Please use good hand washing techniques before changing the dressing.  Do not use any lotions or creams on the incision until instructed by your surgeon.     ACTIVITY  Increase activity slowly as tolerated, but follow the weight bearing instructions below.   No driving for 6 weeks or until further direction given by your physician.  You cannot drive while taking narcotics.  No lifting or carrying greater than 10 lbs. until further directed by your surgeon. Avoid periods of inactivity such as sitting longer than an hour when not asleep. This helps prevent blood clots.  You may return to work once you are authorized by your doctor.     WEIGHT BEARING   Weight bearing as tolerated with assist device (walker, cane, etc) as directed, use it as long as suggested by your surgeon or therapist, typically at least 4-6 weeks.   EXERCISES  Results after joint replacement surgery are often greatly improved when you follow the exercise, range of motion and muscle strengthening exercises prescribed by your doctor. Safety measures are also important to protect the joint from further injury. Any time any of these exercises cause you to have increased pain or swelling, decrease what you are doing until you are comfortable again and then slowly increase them. If you have problems or questions, call your caregiver or physical therapist for advice.   Rehabilitation is important following a joint replacement. After just a few days of immobilization, the muscles of the leg can become weakened and shrink (atrophy).  These exercises are designed to build up the tone and strength of the thigh and leg muscles and to improve motion. Often times heat used for twenty to thirty minutes before  working out will loosen up your tissues and help with improving the range of motion but do not use heat for the first two weeks following surgery (sometimes heat can increase post-operative swelling).   These exercises can be done on a training (exercise) mat, on the floor, on a table or on a bed. Use whatever works the best and is most comfortable for you.    Use music or television  while you are exercising so that the exercises are a pleasant break in your day. This will make your life better with the exercises acting as a break in your routine that you can look forward to.   Perform all exercises about fifteen times, three times per day or as directed.  You should exercise both the operative leg and the other leg as well.  Exercises include:   Quad Sets - Tighten up the muscle on the front of the thigh (Quad) and hold for 5-10 seconds.   Straight Leg Raises - With your knee straight (if you were given a brace, keep it on), lift the leg to 60 degrees, hold for 3 seconds, and slowly lower the leg.  Perform this exercise against resistance later as your leg gets stronger.  Leg Slides: Lying on your back, slowly slide your foot toward your buttocks, bending your knee up off the floor (only go as far as is comfortable). Then slowly slide your foot back down until your leg is flat on the floor again.  Angel Wings: Lying on your back spread your legs to the side as far apart as you can without causing discomfort.  Hamstring Strength:  Lying on your back, push your heel against the floor with your leg straight by tightening up the muscles of your buttocks.  Repeat, but this time bend your knee to a comfortable angle, and push your heel against the floor.  You may put a pillow under the heel to make it more comfortable if necessary.   A rehabilitation program following joint replacement surgery can speed recovery and prevent re-injury in the future due to weakened muscles. Contact your doctor or a physical therapist for more information on knee rehabilitation.    CONSTIPATION  Constipation is defined medically as fewer than three stools per week and severe constipation as less than one stool per week.  Even if you have a regular bowel pattern at home, your normal regimen is likely to be disrupted due to multiple reasons following surgery.  Combination of anesthesia, postoperative  narcotics, change in appetite and fluid intake all can affect your bowels.   YOU MUST use at least one of the following options; they are listed in order of increasing strength to get the job done.  They are all available over the counter, and you may need to use some, POSSIBLY even all of these options:    Drink plenty of fluids (prune juice may be helpful) and high fiber foods Colace 100 mg by mouth twice a day  Senokot for constipation as directed and as needed Dulcolax (bisacodyl ), take with full glass of water   Miralax  (polyethylene glycol) once or twice a day as needed.  If you have tried all these things and are unable to have a bowel movement in the first 3-4 days after surgery call either your surgeon or your primary doctor.    If you experience loose stools or diarrhea, hold the medications until you stool forms back up.  If your symptoms do not get better within 1 week  or if they get worse, check with your doctor.  If you experience the worst abdominal pain ever or develop nausea or vomiting, please contact the office immediately for further recommendations for treatment.   ITCHING:  If you experience itching with your medications, try taking only a single pain pill, or even half a pain pill at a time.  You can also use Benadryl  over the counter for itching or also to help with sleep.   TED HOSE STOCKINGS:  Use stockings on both legs until for at least 2 weeks or as directed by physician office. They may be removed at night for sleeping.  MEDICATIONS:  See your medication summary on the "After Visit Summary" that nursing will review with you.  You may have some home medications which will be placed on hold until you complete the course of blood thinner medication.  It is important for you to complete the blood thinner medication as prescribed.  PRECAUTIONS:  If you experience chest pain or shortness of breath - call 911 immediately for transfer to the hospital emergency department.    If you develop a fever greater that 101 F, purulent drainage from wound, increased redness or drainage from wound, foul odor from the wound/dressing, or calf pain - CONTACT YOUR SURGEON.                                                   FOLLOW-UP APPOINTMENTS:  If you do not already have a post-op appointment, please call the office for an appointment to be seen by your surgeon.  Guidelines for how soon to be seen are listed in your "After Visit Summary", but are typically between 1-4 weeks after surgery.  OTHER INSTRUCTIONS:   Knee Replacement:  Do not place pillow under knee, focus on keeping the knee straight while resting. CPM instructions: 0-90 degrees, 2 hours in the morning, 2 hours in the afternoon, and 2 hours in the evening. Place foam block, curve side up under heel at all times except when in CPM or when walking.  DO NOT modify, tear, cut, or change the foam block in any way.  POST-OPERATIVE OPIOID TAPER INSTRUCTIONS: It is important to wean off of your opioid medication as soon as possible. If you do not need pain medication after your surgery it is ok to stop day one. Opioids include: Codeine, Hydrocodone(Norco, Vicodin), Oxycodone (Percocet, oxycontin ) and hydromorphone  amongst others.  Long term and even short term use of opiods can cause: Increased pain response Dependence Constipation Depression Respiratory depression And more.  Withdrawal symptoms can include Flu like symptoms Nausea, vomiting And more Techniques to manage these symptoms Hydrate well Eat regular healthy meals Stay active Use relaxation techniques(deep breathing, meditating, yoga) Do Not substitute Alcohol to help with tapering If you have been on opioids for less than two weeks and do not have pain than it is ok to stop all together.  Plan to wean off of opioids This plan should start within one week post op of your joint replacement. Maintain the same interval or time between taking each dose  and first decrease the dose.  Cut the total daily intake of opioids by one tablet each day Next start to increase the time between doses. The last dose that should be eliminated is the evening dose.   MAKE SURE YOU:  Understand these instructions.  Get help right away if you are not doing well or get worse.    Thank you for letting us  be a part of your medical care team.  It is a privilege we respect greatly.  We hope these instructions will help you stay on track for a fast and full recovery!     Dental Antibiotics:  In most cases prophylactic antibiotics for Dental procdeures after total joint surgery are not necessary.  Exceptions are as follows:  1. History of prior total joint infection  2. Severely immunocompromised (Organ Transplant, cancer chemotherapy, Rheumatoid biologic meds such as Humera)  3. Poorly controlled diabetes (A1C &gt; 8.0, blood glucose over 200)  If you have one of these conditions, contact your surgeon for an antibiotic prescription, prior to your dental procedure.

## 2024-04-20 NOTE — Plan of Care (Signed)
   Problem: Coping: Goal: Level of anxiety will decrease Outcome: Progressing   Problem: Pain Managment: Goal: General experience of comfort will improve and/or be controlled Outcome: Progressing   Problem: Safety: Goal: Ability to remain free from injury will improve Outcome: Progressing

## 2024-04-20 NOTE — Care Management Obs Status (Signed)
 MEDICARE OBSERVATION STATUS NOTIFICATION   Patient Details  Name: RAMEL TOBON MRN: [id number] Date of Birth: 15-Jul-1956   Medicare Observation Status Notification Given:  Yes    Adilee Lemme A Krisalyn Yankowski, LCSW 04/20/2024, 10:02 AM

## 2024-04-20 NOTE — Progress Notes (Signed)
 Subjective: 1 Day Post-Op Procedure(s) (LRB): ARTHROPLASTY, HIP, TOTAL, ANTERIOR APPROACH (Right) Patient reports pain as moderate.    Objective: Vital signs in last 24 hours: Temp:  [97.9 F (36.6 C)-98.4 F (36.9 C)] 98.1 F (36.7 C) (08/23 0855) Pulse Rate:  [81-100] 81 (08/23 0855) Resp:  [15-18] 15 (08/23 0855) BP: (103-121)/(65-86) 121/81 (08/23 0855) SpO2:  [91 %-97 %] 93 % (08/23 0855) FiO2 (%):  [21 %] 21 % (08/22 2304)  Intake/Output from previous day: 08/22 0701 - 08/23 0700 In: 3453 [P.O.:957; I.V.:1996; IV Piggyback:500] Out: 4650 [Urine:4550; Blood:100] Intake/Output this shift: Total I/O In: 240 [P.O.:240] Out: -   Recent Labs    04/20/24 0333  HGB 13.7   Recent Labs    04/20/24 0333  WBC 14.9*  RBC 4.98  HCT 43.1  PLT 141*   Recent Labs    04/20/24 0333  NA 134*  K 4.1  CL 103  CO2 23  BUN 21  CREATININE 0.86  GLUCOSE 193*  CALCIUM  8.2*   No results for input(s): LABPT, INR in the last 72 hours.  Sensation intact distally Intact pulses distally Dorsiflexion/Plantar flexion intact Incision: dressing C/D/I   Assessment/Plan: 1 Day Post-Op Procedure(s) (LRB): ARTHROPLASTY, HIP, TOTAL, ANTERIOR APPROACH (Right) Up with therapy Discharge home with home health      [name] 04/20/2024, 10:46 AM

## 2024-04-20 NOTE — Progress Notes (Signed)
 Physical Therapy Treatment Patient Details Name: Shaun [name] MRN: [id number] DOB: 04-01-1956 Today's Date: 04/20/2024   History of Present Illness 68 yo male presents to therapy s/p R THA, anterior approach on 04/19/2024 due to failure of conservative measures. Pt PMH includes but is not limited to: GERD, tremors, SBO s/p bowel resection, depression, sundowning, hernia s/p repair, HLD, RLS, dCFH, asthma, COPD, CAD, DM II, CAD s/p angioplasty, OSA, A-fib, tobacco abuse and diverticulitis.    PT Comments  Pt agreeable to session, wife present with pt personal walker. Walker heigh adjusted to improve UE efficiency prior to mobility. He is able to amb x187ft at supervision A with RW and completes 1 step per home setup with CGA. HEP reviewed and completed with pt, educated on symptom response to activity and progression as tolerated.     If plan is discharge home, recommend the following: A little help with walking and/or transfers;A little help with bathing/dressing/bathroom;Assistance with cooking/housework;Assist for transportation;Help with stairs or ramp for entrance   Can travel by private vehicle        Equipment Recommendations  None recommended by PT    Recommendations for Other Services       Precautions / Restrictions Precautions Precautions: Fall Restrictions Weight Bearing Restrictions Per Provider Order: Yes RLE Weight Bearing Per Provider Order: Weight bearing as tolerated     Mobility  Bed Mobility               General bed mobility comments: in recliner    Transfers Overall transfer level: Modified independent Equipment used: Rolling walker (2 wheels) Transfers: Sit to/from Stand Sit to Stand: Modified independent (Device/Increase time)                Ambulation/Gait Ambulation/Gait assistance: Supervision Gait Distance (Feet): 130 Feet Assistive device: Rolling walker (2 wheels) Gait Pattern/deviations: Step-to pattern, Decreased stance  time - right, Antalgic, Trunk flexed Gait velocity: decreased     General Gait Details: walker height adjusted to improve UE efficiency, pt able to complete step to pattern and directional changes safely.   Stairs Stairs: Yes Stairs assistance: Contact guard assist Stair Management: No rails Number of Stairs: 1 General stair comments: pt complete 1 step up/down to reflect home setup entry and exit from home, well tolerated stepping into the walker and down into the walker from step with appropriate sequence   Wheelchair Mobility     Tilt Bed    Modified Rankin (Stroke Patients Only)       Balance Overall balance assessment: Needs assistance Sitting-balance support: Feet supported Sitting balance-Leahy Scale: Good     Standing balance support: During functional activity Standing balance-Leahy Scale: Fair                              Hotel manager: No apparent difficulties  Cognition Arousal: Alert Behavior During Therapy: WFL for tasks assessed/performed   PT - Cognitive impairments: No apparent impairments                         Following commands: Intact      Cueing    Exercises Total Joint Exercises Hip ABduction/ADduction: AROM, 5 reps, Right Knee Flexion: AROM, Right, Standing, 10 reps Marching in Standing: AROM, 10 reps, Standing, Right Standing Hip Extension: AROM, 5 reps, Right, Standing    General Comments        Pertinent Vitals/Pain Pain Assessment Pain Assessment:  0-10 Pain Score: 3  Pain Location: R hip and LE Pain Descriptors / Indicators: Aching, Constant, Discomfort, Dull, Operative site guarding Pain Intervention(s): Limited activity within patient's tolerance, Monitored during session, Repositioned    Home Living                          Prior Function            PT Goals (current goals can now be found in the care plan section) Acute Rehab PT Goals Patient Stated  Goal: to get back to doing what every I want even climbing a ladder PT Goal Formulation: With patient Time For Goal Achievement: 05/10/24 Potential to Achieve Goals: Good Progress towards PT goals: Progressing toward goals    Frequency    7X/week      PT Plan      Co-evaluation              AM-PAC PT 6 Clicks Mobility   Outcome Measure  Help needed turning from your back to your side while in a flat bed without using bedrails?: None Help needed moving from lying on your back to sitting on the side of a flat bed without using bedrails?: A Little Help needed moving to and from a bed to a chair (including a wheelchair)?: A Little Help needed standing up from a chair using your arms (e.g., wheelchair or bedside chair)?: None Help needed to walk in hospital room?: A Little Help needed climbing 3-5 steps with a railing? : A Little 6 Click Score: 20    End of Session Equipment Utilized During Treatment: Gait belt Activity Tolerance: Patient tolerated treatment well Patient left: in chair;with call bell/phone within reach;with family/visitor present;with chair alarm set Nurse Communication: Mobility status PT Visit Diagnosis: Unsteadiness on feet (R26.81);Other abnormalities of gait and mobility (R26.89);Muscle weakness (generalized) (M62.81);Difficulty in walking, not elsewhere classified (R26.2);Pain Pain - Right/Left: Right Pain - part of body: Hip;Leg     Time: 8990-8967 PT Time Calculation (min) (ACUTE ONLY): 23 min  Charges:    $Gait Training: 8-22 mins $Therapeutic Exercise: 8-22 mins PT General Charges $$ ACUTE PT VISIT: 1 Visit                     Stann, PT Acute Rehabilitation Services Office: [phone] 04/20/2024    Stann DELENA Ohara 04/20/2024, 10:38 AM

## 2024-04-22 ENCOUNTER — Other Ambulatory Visit: Payer: Self-pay | Admitting: Cardiology

## 2024-04-23 ENCOUNTER — Encounter (HOSPITAL_COMMUNITY): Payer: Self-pay | Admitting: Orthopaedic Surgery

## 2024-04-24 ENCOUNTER — Other Ambulatory Visit: Payer: Self-pay

## 2024-04-24 ENCOUNTER — Other Ambulatory Visit (HOSPITAL_BASED_OUTPATIENT_CLINIC_OR_DEPARTMENT_OTHER): Payer: Self-pay

## 2024-04-24 MED ORDER — POTASSIUM CHLORIDE CRYS ER 20 MEQ PO TBCR
20.0000 meq | EXTENDED_RELEASE_TABLET | Freq: Every day | ORAL | 1 refills | Status: AC
  Filled 2024-04-24: qty 90, 90d supply, fill #0
  Filled 2024-07-20: qty 90, 90d supply, fill #1

## 2024-05-02 ENCOUNTER — Ambulatory Visit (INDEPENDENT_AMBULATORY_CARE_PROVIDER_SITE_OTHER): Admitting: Orthopaedic Surgery

## 2024-05-02 ENCOUNTER — Other Ambulatory Visit (HOSPITAL_BASED_OUTPATIENT_CLINIC_OR_DEPARTMENT_OTHER): Payer: Self-pay

## 2024-05-02 ENCOUNTER — Other Ambulatory Visit: Payer: Self-pay

## 2024-05-02 ENCOUNTER — Encounter: Payer: Self-pay | Admitting: Orthopaedic Surgery

## 2024-05-02 DIAGNOSIS — Z96641 Presence of right artificial hip joint: Secondary | ICD-10-CM

## 2024-05-02 MED ORDER — OXYCODONE HCL 5 MG PO TABS
5.0000 mg | ORAL_TABLET | Freq: Four times a day (QID) | ORAL | 0 refills | Status: DC | PRN
  Filled 2024-05-02: qty 30, 8d supply, fill #0

## 2024-05-02 NOTE — Progress Notes (Signed)
 The patient is a 68 year old gentleman who is here today for his first postoperative visit 2 weeks status post a right total hip arthroplasty through an anterior approach.  He is on Eliquis  is a blood thinning medication that he was on prior to surgery and on chronically secondary to A-fib.  The patient is ambulating with a walker.  He is also on chronic narcotic pain medication and was on hydrocodone  prior to surgery but I do have him on oxycodone .  He is requesting a refill of this 1 time.  On examination his right hip incision looks good.  Staples removed Steri-Strips applied.  He does have foot and ankle swelling but his calf is soft.  Has been wearing TED hose and compliant with this.  He has had his last home therapy visit and it is appropriately requesting some outpatient physical therapy.  I will send in another prescription for oxycodone  for him to use sparingly and then he will transition back to his hydrocodone  that he was on prior to surgery.  We will work on ordering outpatient physical therapy at [facility] with really PT working on his balance and coordination as well as gait training and mobility.  Will then see him back in a month to see how he is doing overall but no x-rays are needed.

## 2024-05-03 ENCOUNTER — Other Ambulatory Visit: Payer: Self-pay | Admitting: Nurse Practitioner

## 2024-05-03 ENCOUNTER — Other Ambulatory Visit (HOSPITAL_BASED_OUTPATIENT_CLINIC_OR_DEPARTMENT_OTHER): Payer: Self-pay

## 2024-05-03 DIAGNOSIS — G4700 Insomnia, unspecified: Secondary | ICD-10-CM

## 2024-05-03 MED ORDER — TRAZODONE HCL 150 MG PO TABS
150.0000 mg | ORAL_TABLET | Freq: Every day | ORAL | 1 refills | Status: AC
  Filled 2024-05-03: qty 90, 90d supply, fill #0
  Filled 2024-08-06: qty 90, 90d supply, fill #1

## 2024-05-09 ENCOUNTER — Other Ambulatory Visit (INDEPENDENT_AMBULATORY_CARE_PROVIDER_SITE_OTHER)

## 2024-05-09 DIAGNOSIS — F17210 Nicotine dependence, cigarettes, uncomplicated: Secondary | ICD-10-CM

## 2024-05-09 DIAGNOSIS — E1165 Type 2 diabetes mellitus with hyperglycemia: Secondary | ICD-10-CM

## 2024-05-09 NOTE — Progress Notes (Cosign Needed Addendum)
 05/09/2024 Name: Shaun [name] MRN: [id number] DOB: 1956/06/18  Chief Complaint  Patient presents with   Medication Management   Diabetes    Shaun [name] is a 68 y.o. year old male who presented for a telephone visit.   They were referred to the pharmacist by their PCP for assistance in managing diabetes and medication access.    Subjective:  Care Team: Primary Care Provider: Oris Camie BRAVO, NP ; Next Scheduled Visit: 12/24/24  Medication Access/Adherence  Current Pharmacy:  [facility] [address] [place] [address] Phone: [phone] Fax: [phone]   Patient reports affordability concerns with their medications: No Patient reports access/transportation concerns to their pharmacy: No  Patient reports adherence concerns with their medications:  No     Diabetes:  Current medications: Jardiance  10mg , Metformin  500mg  BID Medications tried in the past: Glipizide   Current glucose readings:  Checks once to twice daily, usually post-prandial in the evening Seeing readings from 90-145 fasting, normally below 130  Observed patterns:  Patient denies hypoglycemic s/sx including dizziness, shakiness, sweating. Patient denies hyperglycemic symptoms including polyuria, polydipsia, polyphagia, nocturia, neuropathy, blurred vision.  Current medication access support: Jardiance  through [employer]  Tobacco Abuse:  Tobacco Use History: Number of cigarettes per day: None as of 5 weeks today    Objective:  Lab Results  Component Value Date   HGBA1C 7.5 (H) 03/14/2024    Lab Results  Component Value Date   CREATININE 0.86 04/20/2024   BUN 21 04/20/2024   NA 134 (L) 04/20/2024   K 4.1 04/20/2024   CL 103 04/20/2024   CO2 23 04/20/2024    Lab Results  Component Value Date   CHOL 131 12/07/2023   HDL 30 (L) 12/07/2023   LDLCALC 66 12/07/2023   LDLDIRECT 83.0 04/19/2022   TRIG 213 (H) 12/07/2023    CHOLHDL 4.4 12/07/2023    Medications Reviewed Today     Reviewed by Lionell Jon DEL, RPH (Pharmacist) on 05/09/24 at [phone]  Med List Status: <None>   Medication Order Taking? Sig Documenting Provider Last Dose Status Informant  albuterol  (PROVENTIL ) (2.5 MG/3ML) 0.083% nebulizer solution [id number] Yes Take 3 mLs (2.5 mg total) by nebulization every 6 (six) hours as needed for wheezing or shortness of breath. Dreama Longs, MD  Active Self, Pharmacy Records           Med Note SOILA, BROOKE C   Tue Dec 12, 2023 10:12 AM)    albuterol  (VENTOLIN  HFA) 108 [phone] Base) MCG/ACT inhaler [id number] Yes INHALE 2 PUFFS INTO THE LUNGS EVERY 6 HOURS AS NEEDED FOR WHEEZING OR SHORTNESS OF BREATH Tysinger, Alm RAMAN, PA-C  Active Self, Pharmacy Records           Med Note SOILA, BROOKE C   Tue Dec 12, 2023 10:12 AM)    apixaban  (ELIQUIS ) 5 MG TABS tablet [id number] Yes Take 1 tablet (5 mg total) by mouth 2 (two) times daily. Pietro Redell RAMAN, MD  Active Self, Pharmacy Records  ascorbic acid  (VITAMIN C ) 500 MG tablet [id number] Yes Take 500 mg by mouth daily. [provider]  Active Self, Pharmacy Records  atorvastatin  (LIPITOR ) 80 MG tablet [id number] Yes Take 1 tablet (80 mg total) by mouth every evening. Janene Boer, [place]  Active Self, Pharmacy Records  buPROPion  (WELLBUTRIN  XL) 150 MG 24 hr tablet [id number] Yes Take 3 tablets (450 mg total) by mouth daily. Early, Sara E, NP  Active Self, Pharmacy Records  Discontinued 05/09/24 0902 (Completed Course) docusate sodium  (COLACE) 100 MG capsule [id number] Yes Take 1 capsule (100 mg total) by mouth daily as needed for mild constipation. Early, Sara E, NP  Active Self, Pharmacy Records  empagliflozin  (JARDIANCE ) 10 MG TABS tablet [id number] Yes Take 10 mg by mouth daily. Through [employer] [facility] PAP [provider]  Active Self, Pharmacy Records  escitalopram  (LEXAPRO ) 20 MG tablet [id number] Yes Take 20 mg by mouth in the morning. [provider]   Active Self, Pharmacy Records  ezetimibe  (ZETIA ) 10 MG tablet [id number] Yes Take 1 tablet (10 mg total) by mouth daily. Pietro Redell RAMAN, MD  Active Self, Pharmacy Records  ferrous sulfate  [facility]) [phone] FE) MG tablet [id number] Yes Take 1 tablet (325 mg total) by mouth 3 (three) times a week. Early, Sara E, NP  Active Self, Pharmacy Records  furosemide  (LASIX ) 40 MG tablet [id number] Yes Take 1 tablet (40mg ) daily. May take 1/2 tablet (20mg ) additional if shortness of breath or swelling occurs. Early, Sara E, NP  Active Self, Pharmacy Records  hydrOXYzine  (ATARAX ) 25 MG tablet [id number] Yes Take 1 tablet (25 mg total) by mouth at bedtime and may repeat dose one time if needed for sleep. Oris Camie BRAVO, NP  Active Self, Pharmacy Records  lidocaine  (XYLOCAINE ) 1 % (with pres) injection 5 mL [id number]   Carilyn Prentice BRAVO, MD  Active   metFORMIN  (GLUCOPHAGE ) 500 MG tablet [id number] Yes Take 1 tablet (500 mg total) by mouth 2 (two) times daily before a meal. Early, Camie BRAVO, NP  Active Self, Pharmacy Records  methimazole  (TAPAZOLE ) 5 MG tablet [id number] Yes Take 1 tablet (5 mg total) by mouth as directed. 1 tablet 4 days a week  Patient taking differently: Take 5 mg by mouth as directed. 1 tablet once weekly on Wed   [name], Ibtehal Jaralla, MD  Active Self, Pharmacy Records           Med Note JANEAN JON DEL   Thu May 09, 2024  9:03 AM)    metoprolol  tartrate (LOPRESSOR ) 100 MG tablet [id number] Yes Take 1 tablet (100 mg total) by mouth 2 (two) times daily. Janene Boer, [place]  Active Self, Pharmacy Records  nitroGLYCERIN  (NITROSTAT ) 0.4 MG SL tablet [id number] Yes Place 1 tablet (0.4 mg total) under the tongue every 5 (five) minutes as needed. Early, Sara E, NP  Active Self, Pharmacy Records           Med Note SOILA, BROOKE C   Tue Dec 12, 2023 10:16 AM)    oxyCODONE  (OXY IR/ROXICODONE ) 5 MG immediate release tablet [id number] Yes Take 1 tablet (5 mg total) by mouth every 6 (six) hours as needed for  moderate pain (pain score 4-6) (pain score 4-6). Vernetta Lonni GRADE, MD  Active   pantoprazole  (PROTONIX ) 40 MG tablet [id number] Yes Take 1 tablet (40 mg total) by mouth daily before breakfast. Ezzard Sonny RAMAN, PA-C  Active Self, Pharmacy Records  polyethylene glycol powder (GLYCOLAX /MIRALAX ) 17 GM/SCOOP powder [id number] Yes Take one capful (17g) twice daily until soft stool, then continue one capful once daily as needed. Ezzard Sonny RAMAN, PA-C  Active Self, Pharmacy Records  potassium chloride  SA (KLOR-CON  M) 20 MEQ tablet [id number] Yes Take 1 tablet (20 mEq total) by mouth daily. Pietro Redell RAMAN, MD  Active   rOPINIRole  (REQUIP ) 3 MG tablet [id number] Yes Take 1 tablet (3 mg total) by mouth at bedtime, AND 0.5 tablets (1.5 mg total) during the day for breakthrough symptoms. Early,  Sara E, NP  Active   sildenafil  (VIAGRA ) 100 MG tablet [id number] Yes Take 1 tablet (100 mg total) by mouth as needed for erectile dysfunction. Do not take nitroglycerine within 24 hours of last dose. Early, Sara E, NP  Active Self, Pharmacy Records  tiZANidine  (ZANAFLEX ) 4 MG tablet [id number] Yes Take 1 tablet (4 mg total) by mouth every 6 (six) hours as needed for muscle spasms. Vernetta Lonni GRADE, MD  Active   traZODone  (DESYREL ) 150 MG tablet [id number] Yes Take 1 tablet (150 mg total) by mouth at bedtime. Early, Sara E, NP  Active   Vitamin D , Ergocalciferol , (DRISDOL ) 1.25 MG (50000 UNIT) CAPS capsule [id number] Yes Take 1 capsule (50,000 Units total) by mouth every 7 (seven) days. Oris Camie BRAVO, NP  Active Self, Pharmacy Records              Assessment/Plan:   Diabetes: - Currently uncontrolled per last A1C but reporting controlled readings at home - Reviewed long term cardiovascular and renal outcomes of uncontrolled blood sugar - Reviewed goal A1c, goal fasting, and goal 2 hour post prandial glucose - Reviewed dietary modifications including low carb diet -Recommend to continue current medication  therapy. Consider increasing metformin  if A1C still above 7 at next check - Recommend to check glucose daily     Tobacco Abuse - Currently controlled -Congratulated patient on continued success in having stopped smoking, encouraged to reach back out if he begins to feel tempted to restart    Follow Up Plan: 2 months  Jon VEAR Lindau, PharmD Clinical Pharmacist [phone]   Jon VEAR Lindau, PharmD Clinical Pharmacist [phone]

## 2024-05-10 ENCOUNTER — Telehealth: Payer: Self-pay | Admitting: Neurology

## 2024-05-10 ENCOUNTER — Encounter: Payer: Self-pay | Admitting: Nurse Practitioner

## 2024-05-10 ENCOUNTER — Other Ambulatory Visit (HOSPITAL_BASED_OUTPATIENT_CLINIC_OR_DEPARTMENT_OTHER): Payer: Self-pay

## 2024-05-10 ENCOUNTER — Telehealth: Admitting: Nurse Practitioner

## 2024-05-10 VITALS — BP 118/78 | Wt 212.0 lb

## 2024-05-10 DIAGNOSIS — F331 Major depressive disorder, recurrent, moderate: Secondary | ICD-10-CM | POA: Diagnosis not present

## 2024-05-10 DIAGNOSIS — G894 Chronic pain syndrome: Secondary | ICD-10-CM | POA: Diagnosis not present

## 2024-05-10 DIAGNOSIS — M2550 Pain in unspecified joint: Secondary | ICD-10-CM | POA: Diagnosis not present

## 2024-05-10 MED ORDER — TIZANIDINE HCL 4 MG PO TABS
4.0000 mg | ORAL_TABLET | Freq: Two times a day (BID) | ORAL | 0 refills | Status: DC | PRN
  Filled 2024-05-10: qty 30, 15d supply, fill #0

## 2024-05-10 MED ORDER — HYDROCODONE-ACETAMINOPHEN 5-325 MG PO TABS
1.0000 | ORAL_TABLET | Freq: Two times a day (BID) | ORAL | 0 refills | Status: DC | PRN
  Filled 2024-05-10: qty 120, 30d supply, fill #0

## 2024-05-10 NOTE — Patient Instructions (Addendum)
 Wellbutrin  Taper        Option 1- as long as you don't have any symptoms of withdrawal    Sun Mon Tue Wed Thur Fri Sat  Week 1 300mg  450mg  300mg  450mg  300mg  450mg  300mg            Week 2 300mg  300mg  300mg  300mg  300mg  300mg  300mg            Week 3 150mg  300mg  150mg  300mg  150mg  300mg  150mg            Week 4  150mg  150mg  150mg  150mg  150mg  150mg  150mg            Week 5  None 150mg  None 150mg  None 150mg  None           Week 6  STOP                 Option 2- If you start to have symptoms of withdrawl when starting week 2 or at any point, you can switch to this taper schedule starting with whatever week you are on.            [place] [facility] Tue Wed Thur Fri Sat  Week 1 300mg  450mg  300mg  450mg  300mg  450mg  300mg            Week 2 300mg  300mg  450mg  300mg  300mg  450mg  300mg            Week 3 300mg  300mg  300mg  300mg  300mg  300mg  300mg            Week 4 150mg  300mg  150mg  300mg  150mg  300mg  150mg            Week 5 150mg  150mg  300mg  150mg  150mg  300mg  150mg            Week 6 150mg  150mg  150mg  150mg  150mg  150mg  150mg            Week 7 150mg  None 150mg  None 150mg  None 150mg            Week 8 None None 150mg  None None 150mg  None           Week 9 STOP

## 2024-05-10 NOTE — Progress Notes (Signed)
 Virtual Visit Encounter mychart visit.   I connected with  Shaun [name] on 05/10/24 at  3:30 PM EDT by secure video and audio telemedicine application. I verified that I am speaking with the correct person using two identifiers.   I introduced myself as a Publishing rights manager with the practice. The limitations of evaluation and management by telemedicine discussed with the patient and the availability of in person appointments. The patient expressed verbal understanding and consent to proceed.  Participating parties in this visit include: Myself and patient  The patient is: Patient Location: Home I am: Provider Location: Office/Clinic Subjective:    CC and HPI:  Shaun [name] is a 68 year old male who presents for medication management and tapering of Wellbutrin  in preparation for a neurological scan.  He is currently on a high dose of Wellbutrin . He has been on Wellbutrin  for several years, although he cannot recall the exact duration. He has a sufficient supply of the medication to complete the tapering process. He will need to be off of the medication for a total of 8 days prior to the scan.   He is also taking tizanidine  and oxycodone  for pain management following recent hip surgery. He was previously on hydrocodone  for chronic pain management, but this was switched to aid in recovery. Hydrocodone  effectively manages his pain and he is ready to transition back. He requires refills for these medications.  Past medical history, Surgical history, Family history not pertinant except as noted below, Social history, Allergies, and medications have been entered into the medical record, reviewed, and corrections made.   Review of Systems:  All review of systems negative except what is listed in the HPI  Objective:    Alert and oriented x 4 Speaking in clear sentences with no shortness of breath. No distress.  Impression and Recommendations:    Problem List Items Addressed  This Visit     Current moderate episode of major depressive disorder without prior episode (HCC) - Primary   He is on a high dose of Wellbutrin , which requires tapering before neurologic imaging to avoid interference with results. The tapering process is complex and necessitates monitoring for withdrawal symptoms and mood changes. The neurologist advised this tapering is necessary for assessment and diagnosis, therefore I feel the benefits are greater than the risks at this time. We discussed that he should reach out at any point if his depression is severe or he has thoughts of harm. He should also reach out if he has symptoms of withdrawal not controlled with the longer taper.  - Initiate Wellbutrin  taper starting Sunday with alternating doses of 300 mg and 450 mg for the first week. - Adjust tapering schedule based on tolerance, with options to reduce to 300 mg daily or alternate 300 mg and 450 mg every two days in the second week. - Continue tapering to 150 mg and 300 mg alternating in the third week, then 150 mg daily or alternate 150 mg and 300 mg every two days based on symptoms. - In the final week, alternate 150 mg and no dose every other day before stopping. - Monitor for mood changes and withdrawal symptoms such as brain zaps or headaches. - Document tapering plan in after visit summary for patient reference. - Communicate with neurologist regarding tapering progress and completion. - Instruct to reach out if experiencing any unusual symptoms or mood changes during the Wellbutrin  taper.      Chronic pain syndrome   Chronic pain  managed effectively with hydrocodone  without causing drowsiness. Oxycodone  previously for acute post-op period. Will transition back to hydrocodone  for chronic management.        Relevant Medications   tiZANidine  (ZANAFLEX ) 4 MG tablet   HYDROcodone -acetaminophen  (NORCO/VICODIN) 5-325 MG tablet   Other Visit Diagnoses       Polyarthralgia       Relevant  Medications   tiZANidine  (ZANAFLEX ) 4 MG tablet   HYDROcodone -acetaminophen  (NORCO/VICODIN) 5-325 MG tablet       orders and follow up as documented in EMR I discussed the assessment and treatment plan with the patient. The patient was provided an opportunity to ask questions and all were answered. The patient agreed with the plan and demonstrated an understanding of the instructions.   The patient was advised to call back or seek an in-person evaluation if the symptoms worsen or if the condition fails to improve as anticipated.  Follow-Up: after labs and/or imaging, consults, etc. have been completed  I provided 20 minutes of non-face-to-face interaction with this non face-to-face encounter including intake, same-day documentation, and chart review.   Camie CHARLENA Doing, NP , DNP, AGNP-c [facility]

## 2024-05-10 NOTE — Telephone Encounter (Signed)
 Good morning Dr. Evonnie and Mitzie,  How quickly do you need to complete the scan? With this dosage, it may take several weeks to completely taper (I typically try to taper no more than 100mg  a week).   Adam, can you call [place] to see if we can get him set up with a visit (virtual is fine) to go over a titration schedule on the wellbutrin  to prepare for the scan?  SaraBeth

## 2024-05-10 NOTE — Assessment & Plan Note (Signed)
 He is on a high dose of Wellbutrin , which requires tapering before neurologic imaging to avoid interference with results. The tapering process is complex and necessitates monitoring for withdrawal symptoms and mood changes. The neurologist advised this tapering is necessary for assessment and diagnosis, therefore I feel the benefits are greater than the risks at this time. We discussed that he should reach out at any point if his depression is severe or he has thoughts of harm. He should also reach out if he has symptoms of withdrawal not controlled with the longer taper.  - Initiate Wellbutrin  taper starting Sunday with alternating doses of 300 mg and 450 mg for the first week. - Adjust tapering schedule based on tolerance, with options to reduce to 300 mg daily or alternate 300 mg and 450 mg every two days in the second week. - Continue tapering to 150 mg and 300 mg alternating in the third week, then 150 mg daily or alternate 150 mg and 300 mg every two days based on symptoms. - In the final week, alternate 150 mg and no dose every other day before stopping. - Monitor for mood changes and withdrawal symptoms such as brain zaps or headaches. - Document tapering plan in after visit summary for patient reference. - Communicate with neurologist regarding tapering progress and completion. - Instruct to reach out if experiencing any unusual symptoms or mood changes during the Wellbutrin  taper.

## 2024-05-10 NOTE — Assessment & Plan Note (Signed)
 Chronic pain managed effectively with hydrocodone  without causing drowsiness. Oxycodone  previously for acute post-op period. Will transition back to hydrocodone  for chronic management.

## 2024-05-10 NOTE — Telephone Encounter (Signed)
 Chelsea, get [place] for [employer].  He wanted to wait until now because needed to get knee surgery done.  Will send message to NP about his wellbutrin  before the scan because he is on such a big dose.

## 2024-05-11 ENCOUNTER — Other Ambulatory Visit (HOSPITAL_BASED_OUTPATIENT_CLINIC_OR_DEPARTMENT_OTHER): Payer: Self-pay

## 2024-05-13 NOTE — Therapy (Unsigned)
 OUTPATIENT PHYSICAL THERAPY LOWER EXTREMITY EVALUATION   Patient Name: Shaun [name] MRN: [id number] DOB:07/22/1956, 68 y.o., male Today's Date: 05/14/2024  END OF SESSION:  PT End of Session - 05/14/24 1303     Visit Number 1    Number of Visits 8    Date for PT Re-Evaluation 06/11/24    Authorization Type [employer] MCR    PT Start Time 1300    PT Stop Time 1335    PT Time Calculation (min) 35 min    Equipment Utilized During Treatment Gait belt    Activity Tolerance Patient tolerated treatment well    Behavior During Therapy WFL for tasks assessed/performed          Past Medical History:  Diagnosis Date   A-fib (HCC)    Anginal pain (HCC)    Anxiety    Bacterial URI 08/29/2022   CAD (coronary artery disease)    DES to distal circumflex 2016   Cataract    CHF (congestive heart failure) (HCC)    Colon polyps    30 colon polyps found on first colonoscopy   Complication of anesthesia    COPD (chronic obstructive pulmonary disease) (HCC)    Depression    Diastolic heart failure (HCC)    Diverticulitis    DJD (degenerative joint disease)    Dyspnea    Dysrhythmia    A-Fib   Genetic testing 09/09/2014   Negative genetic testing on the ColoNext panel test and the MSH2 inversion testing.  The ColoNext gene panel offered by [facility] and includes sequencing and rearrangement analysis for the following 17 genes: APC, BMPR1A, CDH1, CHEK2, EPCAM, GREM1, MLH1, MSH2, MSH6, MUTYH, PMS2, POLD1, POLE, PTEN, SMAD4, STK11, and TP53.   The report date is 09/08/14.      GERD (gastroesophageal reflux disease)    History of kidney stones    Hyperlipidemia    Hypernatremia 08/22/2021   Hypertension    Insomnia    Obstructive sleep apnea 12/2009   01/26/2010 AHI 83/hr   Permanent atrial fibrillation (HCC)    Onset 2006 paroxysmal then progressive to persistent   Pneumonia    PONV (postoperative nausea and vomiting)    PUD (peptic ulcer disease)    1980s   RLS (restless legs  syndrome)    Sinusitis    Skin cancer    Tremor    Type 2 diabetes mellitus (HCC)    Past Surgical History:  Procedure Laterality Date   BIOPSY  07/17/2018   Procedure: BIOPSY;  Surgeon: Harvey Margo CROME, MD;  Location: [facility];  Service: Endoscopy;;  colon   BOWEL RESECTION  09/17/2018   SMALL BOWEL RESECTION: 71 CM    CARDIAC CATHETERIZATION N/A 07/21/2015   Procedure: Left Heart Cath and Coronary Angiography;  Surgeon: Peter M Swaziland, MD;  Location: [facility] INVASIVE CV LAB;  Service: Cardiovascular;  Laterality: N/A;   CARDIAC CATHETERIZATION N/A 07/21/2015   Procedure: Coronary Stent Intervention;  Surgeon: Peter M Swaziland, MD;  Location: [facility] INVASIVE CV LAB;  Service: Cardiovascular;  Laterality: N/A;   CIRCUMCISION N/A 04/05/2019   Procedure: CIRCUMCISION ADULT;  Surgeon: Watt Rush, MD;  Location: [facility];  Service: Urology;  Laterality: N/A;   COLONOSCOPY N/A 05/19/2014   Dr. Sharla diverticulosis/moderate external hemorrhoids, >20 simple adenomas. Genetic screening negative.    COLONOSCOPY WITH PROPOFOL  N/A 07/17/2018   Dr. harvey: Diverticulosis, external/internal hemorrhoids, 32 colon polyps removed.  ten tubular adenomas removed with no high-grade dysplasia.  Advised to have surveillance  colonoscopy in 3 years.   COLONOSCOPY WITH PROPOFOL  N/A 06/07/2021   Procedure: COLONOSCOPY WITH PROPOFOL ;  Surgeon: Cindie Carlin POUR, DO;  Location: [facility];  Service: Endoscopy;  Laterality: N/A;  9:30 / ASA 3  (Pt was told that his time will be given at Pre-op)   CORONARY PRESSURE/FFR STUDY Left 06/08/2017   Procedure: INTRAVASCULAR PRESSURE WIRE/FFR STUDY;  Surgeon: Mady Bruckner, MD;  Location: [facility] INVASIVE CV LAB;  Service: Cardiovascular;  Laterality: Left;  LAD and CFX   ESOPHAGOGASTRODUODENOSCOPY (EGD) WITH PROPOFOL  N/A 07/17/2018   Dr. harvey: Low-grade narrowing Schatzki ring at the GE junction status post dilation.  Gastritis.  Biopsy with mild nonspecific reactive  gastropathy.  No H. pylori.   GIVENS CAPSULE STUDY N/A 06/24/2019   normal   HERNIA REPAIR  1986   Left inguinal   INCISIONAL HERNIA REPAIR N/A 10/19/2022   Procedure: OPEN INCISIONAL HERNIA REPAIR;  Surgeon: Dasie Leonor CROME, MD;  Location: [name] OR;  Service: General;  Laterality: N/A;   INSERTION OF MESH N/A 10/19/2022   Procedure: INSERTION OF MESH;  Surgeon: Dasie Leonor CROME, MD;  Location: [facility] OR;  Service: General;  Laterality: N/A;   LAPAROTOMY N/A 09/17/2018   Procedure: EXPLORATORY LAPAROTOMY;  Surgeon: Kallie Manuelita BROCKS, MD;  Location: [facility];  Service: General;  Laterality: N/A;   LEFT HEART CATH AND CORONARY ANGIOGRAPHY N/A 06/08/2017   Procedure: LEFT HEART CATH AND CORONARY ANGIOGRAPHY;  Surgeon: Mady Bruckner, MD;  Location: [facility] INVASIVE CV LAB;  Service: Cardiovascular;  Laterality: N/A;   POLYPECTOMY  07/17/2018   Procedure: POLYPECTOMY;  Surgeon: harvey Margo CROME, MD;  [facility];  Service: Endoscopy;;  colon   POLYPECTOMY  06/07/2021   Procedure: POLYPECTOMY INTESTINAL;  Surgeon: Cindie Carlin POUR, DO;  Location: [facility];  Service: Endoscopy;;   RIGHT/LEFT HEART CATH AND CORONARY ANGIOGRAPHY N/A 09/19/2023   Procedure: RIGHT/LEFT HEART CATH AND CORONARY ANGIOGRAPHY;  Surgeon: Verlin Bruckner BIRCH, MD;  Location: [facility] INVASIVE CV LAB;  Service: Cardiovascular;  Laterality: N/A;   ROTATOR CUFF REPAIR     Right   SAVORY DILATION N/A 07/17/2018   Procedure: SAVORY DILATION;  Surgeon: harvey Margo CROME, MD;  Location: [facility];  Service: Endoscopy;  Laterality: N/A;   TOTAL HIP ARTHROPLASTY Right 04/19/2024   Procedure: ARTHROPLASTY, HIP, TOTAL, ANTERIOR APPROACH;  Surgeon: Vernetta Bruckner GRADE, MD;  Location: [facility];  Service: Orthopedics;  Laterality: Right;  Needs RNFA   Patient Active Problem List   Diagnosis Date Noted   Chronic pain syndrome 05/10/2024   S/P total right hip arthroplasty 04/19/2024   Status post total replacement of right hip  04/19/2024   Vitamin D  deficiency 12/05/2023   Smoking trying to quit 12/05/2023   Constipation 09/12/2023   GERD (gastroesophageal reflux disease)    Tremor 11/09/2022   SBO (small bowel obstruction) (HCC) 10/29/2022   Thyroid  dysfunction 05/31/2022   Current moderate episode of major depressive disorder without prior episode (HCC) 04/19/2022   Debility 10/21/2021   Sundowning 08/23/2021   Dysphagia 08/22/2021   Obesity (BMI 30-39.9) 08/21/2021   Ventral incisional hernia without obstruction or gangrene 12/31/2020   Dyslipidemia, goal LDL below 70 06/18/2019   Anemia 10/17/2018   Phimosis of penis 09/27/2018   PUD (peptic ulcer disease) 05/21/2018   RLS (restless legs syndrome)    Insomnia    Chronic diastolic CHF (congestive heart failure) (HCC)    Asthma    Hypercoagulable state (HCC) 01/27/2017   COPD (  chronic obstructive pulmonary disease) (HCC) 07/20/2015   Coronary artery disease of native artery of native heart with stable angina pectoris (HCC) 07/19/2015   Encounter for annual physical exam 09/09/2014   Hx of adenomatous colonic polyps 08/11/2014   Type 2 diabetes mellitus with hyperglycemia, without long-term current use of insulin  (HCC) 02/09/2011   CAD S/P percutaneous coronary angioplasty    Hypertension associated with chronic kidney disease due to type 2 diabetes mellitus (HCC)    Chronic atrial fibrillation (HCC) 02/23/2010   Obstructive sleep apnea 12/27/2009    PCP: Oris Credit NP   REFERRING PROVIDER: Vernetta Bruckner MD   REFERRING DIAG: [phone] (ICD-10-CM) - Status post total replacement of right hip  THERAPY DIAG:  Status post right hip replacement  Rationale for Evaluation and Treatment: Rehabilitation  ONSET DATE: 04/19/24  SUBJECTIVE:   SUBJECTIVE STATEMENT: Patient presents to PT s/p Rt THA which has been improving since the surgery.  He is here to just be sure he is doing everything right and to be sure he is on track.  He reports  very minimal to no pain in the past week.  He reports min difficulty with the following activities: walking, standing, home tasks and work activities.   Operated leg has swelling since the surgery, but he notices an improvement. He uses the walker in the house for the most part, but has tried walking with the cane and even some without any device. He reports he has not been following any hip precautions per se but he is cautious and making good decisions.    PERTINENT HISTORY   A-fib   PAIN:  Are you having pain? Yes: NPRS scale: 1/10-2/10 Pain location: Rt lateral hip and thigh  Pain description: discomfort Aggravating factors: not having much  Relieving factors: resting, takes Oxy   PRECAUTIONS: Anterior hip     RED FLAGS: Would like None   WEIGHT BEARING RESTRICTIONS: No  FALLS:  Has patient fallen in last 6 months? No  LIVING ENVIRONMENT: Lives with: lives with their family Lives in: House/apartment Stairs: Yes: External: 1 steps; on right going up Has following equipment at home: Single point cane and Walker - 2 wheeled  OCCUPATION: not working   PLOF: Independent with basic ADLs, Independent with household mobility with device, Independent with community mobility with device, and Leisure: Likes to work in the yard and around the house, play with his grandkids  PATIENT GOALS: I want to be able to climb a ladder work in the yard, baseball   NEXT MD VISIT: Follow up 2-3 weeks   OBJECTIVE:  Note: Objective measures were completed at Evaluation unless otherwise noted.  DIAGNOSTIC FINDINGS: none  PATIENT SURVEYS:  LEFS  Extreme difficulty/unable (0), Quite a bit of difficulty (1), Moderate difficulty (2), Little difficulty (3), No difficulty (4) Survey date:  05/14/24  Any of your usual work, housework or school activities 2  2. Usual hobbies, recreational or sporting activities 2  3. Getting into/out of the bath 1  4. Walking between rooms 4  5. Putting on  socks/shoes 3  6. Squatting  2  7. Lifting an object, like a bag of groceries from the floor 4  8. Performing light activities around your home 3  9. Performing heavy activities around your home 1  10. Getting into/out of a car 3  11. Walking 2 blocks 3  12. Walking 1 mile 0  13. Going up/down 10 stairs (1 flight) 0  14. Standing for 1 hour 1  15.  sitting for 1 hour 4  16. Running on even ground 0  17. Running on uneven ground 0  18. Making sharp turns while running fast 0  19. Hopping  0  20. Rolling over in bed   Score total:  33/80     COGNITION: Overall cognitive status: Within functional limits for tasks assessed     SENSATION: WFL  EDEMA:  R 52 cm, L 49 cm   POSTURE: Rt pelvis retracted in standing, mild forward head and kyphosis in thoracic spine loss of lumbar lordosis  PALPATION: TTP lateral hip and thigh surrounding the incision  LOWER EXTREMITY ROM:  Passive ROM Right eval Left eval  Hip flexion 100   Hip extension    Hip abduction    Hip adduction    Hip internal rotation 20 30  Hip external rotation 25 45  Knee flexion    Knee extension    Ankle dorsiflexion    Ankle plantarflexion    Ankle inversion    Ankle eversion     (Blank rows = not tested)  LOWER EXTREMITY MMT:  MMT Right eval Left eval  Hip flexion 4 5  Hip extension    Hip abduction    Hip adduction    Hip internal rotation    Hip external rotation    Knee flexion 5 5  Knee extension 5 5  Ankle dorsiflexion    Ankle plantarflexion    Ankle inversion    Ankle eversion     (Blank rows = not tested)  LOWER EXTREMITY SPECIAL TESTS:   NT    FUNCTIONAL TESTS:  5 times sit to stand: 18 2 minute walk test: NT   Step ups, tandem and hip abduction in standing   Heel raises x 10   GAIT: Distance walked: 150 feet with mod I  Assistive device utilized: Single point cane Level of assistance: SBA Comments: mod I with RW                                                                                                                                  TREATMENT DATE:   [facility] Adult PT Treatment:                                                DATE: 05/14/2024 Self Care: Gait, balance, safety, hip precautions    Driving  HEP   PATIENT EDUCATION:  Education details: see above  Person educated: Patient Education method: Explanation, Demonstration, and Verbal cues Education comprehension: verbalized understanding and needs further education  HOME EXERCISE PROGRAM: Pt has previous from [facility]    ASSESSMENT:  CLINICAL IMPRESSION: Patient is a 68 y.o. male  who was seen today for physical therapy evaluation and treatment for Rt THA. He has been cleared to  drive and I believe he is safe to do this.   OBJECTIVE IMPAIRMENTS: decreased coordination, decreased endurance, decreased knowledge of use of DME, decreased mobility, difficulty walking, decreased ROM, decreased strength, increased edema, impaired flexibility, impaired sensation, improper body mechanics, postural dysfunction, obesity, and pain.   ACTIVITY LIMITATIONS: lifting, bending, standing, squatting, sleeping, transfers, and locomotion level  PARTICIPATION LIMITATIONS: driving, community activity, and yard work  PERSONAL FACTORS: 1-2 comorbidities: atrial fib, CHF/COPD are also affecting patient's functional outcome.   REHAB POTENTIAL: Excellent  CLINICAL DECISION MAKING: Stable/uncomplicated  EVALUATION COMPLEXITY: Low   GOALS: Goals reviewed with patient? Yes   SHORT TERM GOAL= LONG TERM GOALS: Target date: 06/11/2024    Patient will be independent with final HEP upon discharge from PT and report consistent benefit following exercise completion.    Baseline:  Goal status: INITIAL  2.  Patient will be able to ambulate with cane in the community over 300 feet safely with no loss of balance, modified independent Baseline:  Goal status: INITIAL  3.  Patient will demonstrate 4+/5 hip  extension and abduction strength in order to maximize stability with gait and stair negotiation Baseline:  Goal status: INITIAL  4.  Patient will be able to perform single-leg balance on operated leg static for 15 sec or more  Baseline:  Goal status: INITIAL  5.  Patient will be able to cross his right leg over the left in order to complete lower body dressing Baseline:  Goal status: INITIAL  6.  Endurance goals to be assessed at first treatment visit Baseline:  Goal status: INITIAL   PLAN:  PT FREQUENCY: 1-2x/week  PT DURATION: 4 weeks  PLANNED INTERVENTIONS: 97164- PT Re-evaluation, 97750- Physical Performance Testing, 97110-Therapeutic exercises, 97530- Therapeutic activity, 97112- Neuromuscular re-education, 97535- Self Care, [id number]- Manual therapy, [phone]- Gait training, Patient/Family education, Balance training, Stair training, DME instructions, Cryotherapy, and Moist heat  PLAN FOR NEXT SESSION: Balance assessment, gait with cane develop and establish home program for lower body mobility and strength   Kadey Mihalic, PT 05/14/2024, 2:06 PM    Date of referral: 05/02/24 Referring provider: Dr Lonni Poli  Referring diagnosis? S03.358 (ICD-10-CM) - Status post total replacement of right hip Treatment diagnosis? (if different than referring diagnosis) NA   What was this (referring dx) caused by? S03.358 (ICD-10-CM) - Status post total replacement of right hip  Nature of Condition: Initial Onset (within last 3 months)   Laterality: Rt  Current Functional Measure Score: LEFS 33/80  Objective measurements identify impairments when they are compared to normal values, the uninvolved extremity, and prior level of function.  [x]  Yes  []  No  Objective assessment of functional ability: Minimal functional limitations   Briefly describe symptoms: Difficulty walking, min endurance deficits, hip pain Rt , LE swelling   How did symptoms start: hip OA lead to THA    Average pain intensity:  Last 24 hours: 1/10  Past week: 1/10-2/10  How often does the pt experience symptoms? Intermittently  How much have the symptoms interfered with usual daily activities? A little bit  How has condition changed since care began at this facility? NA - initial visit  In general, how is the patients overall health? Very Good   BACK PAIN (STarT Back Screening Tool) No

## 2024-05-14 ENCOUNTER — Ambulatory Visit: Admitting: Physical Therapy

## 2024-05-14 ENCOUNTER — Encounter: Payer: Self-pay | Admitting: Physical Therapy

## 2024-05-14 ENCOUNTER — Ambulatory Visit: Payer: Medicare Other

## 2024-05-14 DIAGNOSIS — Z96641 Presence of right artificial hip joint: Secondary | ICD-10-CM

## 2024-05-14 DIAGNOSIS — Z0000 Encounter for general adult medical examination without abnormal findings: Secondary | ICD-10-CM | POA: Diagnosis not present

## 2024-05-14 DIAGNOSIS — R262 Difficulty in walking, not elsewhere classified: Secondary | ICD-10-CM

## 2024-05-14 NOTE — Progress Notes (Signed)
 Subjective:   Shaun [name] is a 68 y.o. who presents for a Medicare Wellness preventive visit.  As a reminder, Annual Wellness Visits don't include a physical exam, and some assessments may be limited, especially if this visit is performed virtually. We may recommend an in-person follow-up visit with your provider if needed.  Visit Complete: Virtual I connected with  Giordan Fordham Darin on 05/14/24 by a video and audio enabled telemedicine application and verified that I am speaking with the correct person using two identifiers.  Patient Location: Home  Provider Location: Office/Clinic  I discussed the limitations of evaluation and management by telemedicine. The patient expressed understanding and agreed to proceed.  Vital Signs: Because this visit was a virtual/telehealth visit, some criteria may be missing or patient reported. Any vitals not documented were not able to be obtained and vitals that have been documented are patient reported.    Persons Participating in Visit: Patient.  AWV Questionnaire: No: Patient Medicare AWV questionnaire was not completed prior to this visit.  Cardiac Risk Factors include: advanced age (>70men, >63 women);diabetes mellitus;dyslipidemia;male gender     Objective:    Today's Vitals   There is no height or weight on file to calculate BMI.     05/14/2024    9:27 AM 04/19/2024    5:32 AM 04/12/2024    8:14 AM 04/08/2024   10:29 AM 12/14/2023    8:09 AM 09/19/2023    6:09 AM 06/17/2023   11:53 AM  Advanced Directives  Does Patient Have a Medical Advance Directive? No No No Yes No No No  Type of Advance Directive  Living will  Living will     Would patient like information on creating a medical advance directive? No - Patient declined No - Patient declined No - Patient declined   No - Patient declined No - Patient declined    Current Medications (verified) Outpatient Encounter Medications as of 05/14/2024  Medication Sig   albuterol   (PROVENTIL ) (2.5 MG/3ML) 0.083% nebulizer solution Take 3 mLs (2.5 mg total) by nebulization every 6 (six) hours as needed for wheezing or shortness of breath.   albuterol  (VENTOLIN  HFA) 108 (90 Base) MCG/ACT inhaler INHALE 2 PUFFS INTO THE LUNGS EVERY 6 HOURS AS NEEDED FOR WHEEZING OR SHORTNESS OF BREATH   apixaban  (ELIQUIS ) 5 MG TABS tablet Take 1 tablet (5 mg total) by mouth 2 (two) times daily.   ascorbic acid  (VITAMIN C ) 500 MG tablet Take 500 mg by mouth daily.   atorvastatin  (LIPITOR ) 80 MG tablet Take 1 tablet (80 mg total) by mouth every evening.   buPROPion  (WELLBUTRIN  XL) 150 MG 24 hr tablet Take 3 tablets (450 mg total) by mouth daily.   docusate sodium  (COLACE) 100 MG capsule Take 1 capsule (100 mg total) by mouth daily as needed for mild constipation.   empagliflozin  (JARDIANCE ) 10 MG TABS tablet Take 10 mg by mouth daily. Through [employer] PAP   escitalopram  (LEXAPRO ) 20 MG tablet Take 20 mg by mouth in the morning.   ezetimibe  (ZETIA ) 10 MG tablet Take 1 tablet (10 mg total) by mouth daily.   ferrous sulfate  (FEROSUL) 325 (65 FE) MG tablet Take 1 tablet (325 mg total) by mouth 3 (three) times a week.   furosemide  (LASIX ) 40 MG tablet Take 1 tablet (40mg ) daily. May take 1/2 tablet (20mg ) additional if shortness of breath or swelling occurs.   HYDROcodone -acetaminophen  (NORCO/VICODIN) 5-325 MG tablet Take 1-2 tablets by mouth every 12 (twelve) hours as  needed for moderate pain (pain score 4-6) or severe pain (pain score 7-10).   hydrOXYzine  (ATARAX ) 25 MG tablet Take 1 tablet (25 mg total) by mouth at bedtime and may repeat dose one time if needed for sleep.   metFORMIN  (GLUCOPHAGE ) 500 MG tablet Take 1 tablet (500 mg total) by mouth 2 (two) times daily before a meal.   methimazole  (TAPAZOLE ) 5 MG tablet Take 1 tablet (5 mg total) by mouth as directed. 1 tablet 4 days a week   metoprolol  tartrate (LOPRESSOR ) 100 MG tablet Take 1 tablet (100 mg total) by mouth 2 (two) times daily.    nitroGLYCERIN  (NITROSTAT ) 0.4 MG SL tablet Place 1 tablet (0.4 mg total) under the tongue every 5 (five) minutes as needed.   pantoprazole  (PROTONIX ) 40 MG tablet Take 1 tablet (40 mg total) by mouth daily before breakfast.   polyethylene glycol powder (GLYCOLAX /MIRALAX ) 17 GM/SCOOP powder Take one capful (17g) twice daily until soft stool, then continue one capful once daily as needed.   potassium chloride  SA (KLOR-CON  M) 20 MEQ tablet Take 1 tablet (20 mEq total) by mouth daily.   rOPINIRole  (REQUIP ) 3 MG tablet Take 1 tablet (3 mg total) by mouth at bedtime, AND 0.5 tablets (1.5 mg total) during the day for breakthrough symptoms.   sildenafil  (VIAGRA ) 100 MG tablet Take 1 tablet (100 mg total) by mouth as needed for erectile dysfunction. Do not take nitroglycerine within 24 hours of last dose.   tiZANidine  (ZANAFLEX ) 4 MG tablet Take 1 tablet (4 mg total) by mouth every 12 (twelve) hours as needed for muscle spasms. Only take if pain is not under a 5 with other medications.   traZODone  (DESYREL ) 150 MG tablet Take 1 tablet (150 mg total) by mouth at bedtime.   Vitamin D , Ergocalciferol , (DRISDOL ) 1.25 MG (50000 UNIT) CAPS capsule Take 1 capsule (50,000 Units total) by mouth every 7 (seven) days.   Facility-Administered Encounter Medications as of 05/14/2024  Medication   lidocaine  (XYLOCAINE ) 1 % (with pres) injection 5 mL    Allergies (verified) Patient has no known allergies.   History: Past Medical History:  Diagnosis Date   A-fib (HCC)    Anginal pain (HCC)    Anxiety    Bacterial URI 08/29/2022   CAD (coronary artery disease)    DES to distal circumflex 2016   Cataract    CHF (congestive heart failure) (HCC)    Colon polyps    30 colon polyps found on first colonoscopy   Complication of anesthesia    COPD (chronic obstructive pulmonary disease) (HCC)    Depression    Diastolic heart failure (HCC)    Diverticulitis    DJD (degenerative joint disease)    Dyspnea     Dysrhythmia    A-Fib   Genetic testing 09/09/2014   Negative genetic testing on the ColoNext panel test and the MSH2 inversion testing.  The ColoNext gene panel offered by [facility] and includes sequencing and rearrangement analysis for the following 17 genes: APC, BMPR1A, CDH1, CHEK2, EPCAM, GREM1, MLH1, MSH2, MSH6, MUTYH, PMS2, POLD1, POLE, PTEN, SMAD4, STK11, and TP53.   The report date is 09/08/14.      GERD (gastroesophageal reflux disease)    History of kidney stones    Hyperlipidemia    Hypernatremia 08/22/2021   Hypertension    Insomnia    Obstructive sleep apnea 12/2009   01/26/2010 AHI 83/hr   Permanent atrial fibrillation (HCC)    Onset 2006 paroxysmal then progressive  to persistent   Pneumonia    PONV (postoperative nausea and vomiting)    PUD (peptic ulcer disease)    1980s   RLS (restless legs syndrome)    Sinusitis    Skin cancer    Tremor    Type 2 diabetes mellitus (HCC)    Past Surgical History:  Procedure Laterality Date   BIOPSY  07/17/2018   Procedure: BIOPSY;  Surgeon: Harvey Margo CROME, MD;  [facility];  Service: Endoscopy;;  colon   BOWEL RESECTION  09/17/2018   SMALL BOWEL RESECTION: 71 CM    CARDIAC CATHETERIZATION N/A 07/21/2015   Procedure: Left Heart Cath and Coronary Angiography;  Surgeon: Peter M Swaziland, MD;  Location: [facility] INVASIVE CV LAB;  Service: Cardiovascular;  Laterality: N/A;   CARDIAC CATHETERIZATION N/A 07/21/2015   Procedure: Coronary Stent Intervention;  Surgeon: Peter M Swaziland, MD;  Location: [facility] INVASIVE CV LAB;  Service: Cardiovascular;  Laterality: N/A;   CIRCUMCISION N/A 04/05/2019   Procedure: CIRCUMCISION ADULT;  Surgeon: Watt Rush, MD;  Location: [facility] ORS;  Service: Urology;  Laterality: N/A;   COLONOSCOPY N/A 05/19/2014   Dr. Sharla diverticulosis/moderate external hemorrhoids, >20 simple adenomas. Genetic screening negative.    COLONOSCOPY WITH PROPOFOL  N/A 07/17/2018   Dr. harvey: Diverticulosis,  external/internal hemorrhoids, 32 colon polyps removed.  ten tubular adenomas removed with no high-grade dysplasia.  Advised to have surveillance colonoscopy in 3 years.   COLONOSCOPY WITH PROPOFOL  N/A 06/07/2021   Procedure: COLONOSCOPY WITH PROPOFOL ;  Surgeon: Cindie Carlin POUR, DO;  [facility];  Service: Endoscopy;  Laterality: N/A;  9:30 / ASA 3  (Pt was told that his time will be given at Pre-op)   CORONARY PRESSURE/FFR STUDY Left 06/08/2017   Procedure: INTRAVASCULAR PRESSURE WIRE/FFR STUDY;  Surgeon: Mady Bruckner, MD;  Location: [facility] INVASIVE CV LAB;  Service: Cardiovascular;  Laterality: Left;  LAD and CFX   ESOPHAGOGASTRODUODENOSCOPY (EGD) WITH PROPOFOL  N/A 07/17/2018   Dr. harvey: Low-grade narrowing Schatzki ring at the GE junction status post dilation.  Gastritis.  Biopsy with mild nonspecific reactive gastropathy.  No H. pylori.   GIVENS CAPSULE STUDY N/A 06/24/2019   normal   HERNIA REPAIR  1986   Left inguinal   INCISIONAL HERNIA REPAIR N/A 10/19/2022   Procedure: OPEN INCISIONAL HERNIA REPAIR;  Surgeon: Dasie Leonor CROME, MD;  Location: [facility] OR;  Service: General;  Laterality: N/A;   INSERTION OF MESH N/A 10/19/2022   Procedure: INSERTION OF MESH;  Surgeon: Dasie Leonor CROME, MD;  Location: [facility] OR;  Service: General;  Laterality: N/A;   LAPAROTOMY N/A 09/17/2018   Procedure: EXPLORATORY LAPAROTOMY;  Surgeon: Kallie Manuelita BROCKS, MD;  Location: [facility];  Service: General;  Laterality: N/A;   LEFT HEART CATH AND CORONARY ANGIOGRAPHY N/A 06/08/2017   Procedure: LEFT HEART CATH AND CORONARY ANGIOGRAPHY;  Surgeon: Mady Bruckner, MD;  Location: [facility] INVASIVE CV LAB;  Service: Cardiovascular;  Laterality: N/A;   POLYPECTOMY  07/17/2018   Procedure: POLYPECTOMY;  Surgeon: Harvey Margo CROME, MD;  [facility];  Service: Endoscopy;;  colon   POLYPECTOMY  06/07/2021   Procedure: POLYPECTOMY INTESTINAL;  Surgeon: Cindie Carlin POUR, DO;  [facility];  Service:  Endoscopy;;   RIGHT/LEFT HEART CATH AND CORONARY ANGIOGRAPHY N/A 09/19/2023   Procedure: RIGHT/LEFT HEART CATH AND CORONARY ANGIOGRAPHY;  Surgeon: Verlin Bruckner BIRCH, MD;  Location: [facility] INVASIVE CV LAB;  Service: Cardiovascular;  Laterality: N/A;   ROTATOR CUFF REPAIR     Right  SAVORY DILATION N/A 07/17/2018   Procedure: SAVORY DILATION;  Surgeon: Harvey Margo CROME, MD;  Location[facility];  Service: Endoscopy;  Laterality: N/A;   TOTAL HIP ARTHROPLASTY Right 04/19/2024   Procedure: ARTHROPLASTY, HIP, TOTAL, ANTERIOR APPROACH;  Surgeon: Vernetta Lonni GRADE, MD;  Location: [facility];  Service: Orthopedics;  Laterality: Right;  Needs RNFA   Family History  Problem Relation Age of Onset   Hypertension Mother    Breast cancer Mother 36       brain/bone    Heart attack Father    Skin cancer Sister 44   Breast cancer Sister 57   Diabetes Brother    Parkinson's disease Brother    Brain cancer Maternal Uncle    Cancer Maternal Uncle        NOS   Breast cancer Cousin        maternal cousin dx <50   Cancer Cousin    Colon cancer Neg Hx    Social History   Socioeconomic History   Marital status: Married    Spouse name: [name] Asare   Number of children: 5   Years of education: 12   Highest education level: 12th grade  Occupational History   Occupation: retired    Associate Professor: Programme researcher, broadcasting/film/video    Comment: full-time logistics  Tobacco Use   Smoking status: Former    Current packs/day: 0.00    Types: Cigarettes    Quit date: 04/02/2024    Years since quitting: 0.1    Passive exposure: Past   Smokeless tobacco: Never  Vaping Use   Vaping status: Never Used  Substance and Sexual Activity   Alcohol use: Not Currently   Drug use: Yes    Types: Hydrocodone    Sexual activity: Yes    Partners: Female  Other Topics Concern   Not on file  Social History Narrative   Not on file   Social Drivers of Health   Financial Resource Strain: Low Risk  (05/14/2024)   Overall Financial  Resource Strain (CARDIA)    Difficulty of Paying Living Expenses: Not hard at all  Food Insecurity: No Food Insecurity (05/14/2024)   Hunger Vital Sign    Worried About Running Out of Food in the Last Year: Never true    Ran Out of Food in the Last Year: Never true  Transportation Needs: No Transportation Needs (05/14/2024)   PRAPARE - Administrator, Civil Service (Medical): No    Lack of Transportation (Non-Medical): No  Physical Activity: Inactive (05/14/2024)   Exercise Vital Sign    Days of Exercise per Week: 0 days    Minutes of Exercise per Session: 0 min  Stress: No Stress Concern Present (05/14/2024)   [employer] of Occupational Health - Occupational Stress Questionnaire    Feeling of Stress: Only a little  Social Connections: Moderately Isolated (05/14/2024)   Social Connection and Isolation Panel    Frequency of Communication with Friends and Family: More than three times a week    Frequency of Social Gatherings with Friends and Family: Twice a week    Attends Religious Services: Never    Database administrator or Organizations: No    Attends Engineer, structural: Never    Marital Status: Married    Tobacco Counseling Counseling given: Not Answered    Clinical Intake:  Pre-visit preparation completed: Yes  Pain : No/denies pain     Nutritional Risks: None Diabetes: Yes CBG done?: No Did pt. bring in CBG  monitor from home?: No  Lab Results  Component Value Date   HGBA1C 7.5 (H) 03/14/2024   HGBA1C 8.2 (H) 12/14/2023   HGBA1C 8.4 (H) 12/07/2023     How often do you need to have someone help you when you read instructions, pamphlets, or other written materials from your doctor or pharmacy?: 1 - Never  Interpreter Needed?: No  Information entered by :: NAllen LPN   Activities of Daily Living     05/14/2024    9:22 AM 04/19/2024   10:38 AM  In your present state of health, do you have any difficulty performing the following  activities:  Hearing? 0   Vision? 0   Difficulty concentrating or making decisions? 0   Walking or climbing stairs? 0   Dressing or bathing? 0   Doing errands, shopping? 0 0  Preparing Food and eating ? N   Using the Toilet? N   In the past six months, have you accidently leaked urine? N   Do you have problems with loss of bowel control? N   Managing your Medications? N   Managing your Finances? N   Housekeeping or managing your Housekeeping? N     Patient Care Team: Early, Camie BRAVO, NP as PCP - General (Nurse Practitioner) Pietro Redell RAMAN, MD as PCP - Cardiology (Cardiology) Harvey Margo CROME, MD (Inactive) as Consulting Physician (Gastroenterology) Pietro Redell RAMAN, MD as Consulting Physician (Cardiology) [facility], [facility] (Specialist) [facility], [place], [place] Quinby, Jon DEL, [facility] (Pharmacist)  I have updated your Care Teams any recent Medical Services you may have received from other providers in the past year.     Assessment:   This is a routine wellness examination for Sherry.  Hearing/Vision screen Hearing Screening - Comments:: Denies hearing issues Vision Screening - Comments:: Regular eye exams. [facility]   Goals Addressed             This Visit's Progress    Patient Stated       05/14/2024, wants to lose weight and build up leg and stamina       Depression Screen     05/14/2024    9:29 AM 12/05/2023   10:27 AM 11/07/2023    9:42 AM 06/29/2023    9:32 AM 05/09/2023    9:21 AM 12/27/2022    1:24 PM 11/07/2022    9:33 AM  PHQ 2/9 Scores  PHQ - 2 Score 0 0 0 2 2 0 0  PHQ- 9 Score 6    6      Fall Risk     05/14/2024    9:28 AM 04/08/2024   10:29 AM 12/05/2023   10:27 AM 11/07/2023    9:42 AM 08/11/2023    9:25 AM  Fall Risk   Falls in the past year? 1 0 0 0 0  Comment tripped      Number falls in past yr: 0 0 0    Injury with Fall? 0 0 0    Risk for fall due to : Medication side effect  No Fall Risks    Follow up Falls prevention  discussed;Falls evaluation completed Falls evaluation completed Falls evaluation completed      MEDICARE RISK AT HOME:  Medicare Risk at Home Any stairs in or around the home?: No If so, are there any without handrails?: No Home free of loose throw rugs in walkways, pet beds, electrical cords, etc?: Yes Adequate lighting in your home to reduce risk of falls?:  Yes Life alert?: No Use of a cane, walker or w/c?: Yes Grab bars in the bathroom?: Yes Shower chair or bench in shower?: Yes Elevated toilet seat or a handicapped toilet?: No  TIMED UP AND GO:  Was the test performed?  No  Cognitive Function: 6CIT completed        05/14/2024    9:30 AM 05/09/2023    9:23 AM  6CIT Screen  What Year? 0 points 0 points  What month? 0 points 0 points  What time? 0 points 0 points  Count back from 20 0 points 0 points  Months in reverse 0 points 0 points  Repeat phrase 0 points 2 points  Total Score 0 points 2 points    Immunizations Immunization History  Administered Date(s) Administered   Fluad  Quad(high Dose 65+) 08/08/2021   Fluad  Trivalent(High Dose 65+) 06/02/2023   Influenza Whole 05/14/2010   Influenza, Quadrivalent, Recombinant, Inj, Pf 05/31/2022   Influenza,inj,Quad PF,6+ Mos 06/30/2014, 06/18/2015, 05/17/2016, 05/11/2017, 06/21/2018, 07/01/2019, 05/26/2020, 04/19/2022   Influenza,inj,quad, With Preservative 05/29/2017   Influenza-Unspecified 05/30/2011   PFIZER(Purple Top)SARS-COV-2 Vaccination 11/11/2019, 12/12/2019, 07/30/2020, 07/26/2022   PNEUMOCOCCAL CONJUGATE-20 12/05/2023   Pfizer(Comirnaty )Fall Seasonal Vaccine 12 years and older 07/26/2022, 06/02/2023   Pneumococcal Polysaccharide-23 08/29/2010, 08/08/2021   Td 02/24/2014   Zoster Recombinant(Shingrix) 03/30/2018    Screening Tests Health Maintenance  Topic Date Due   FOOT EXAM  04/20/2023   DTaP/Tdap/Td (2 - Tdap) 02/25/2024   Colonoscopy  06/07/2024   Influenza Vaccine  06/21/2024 (Originally 03/29/2024)    OPHTHALMOLOGY EXAM  07/31/2024   HEMOGLOBIN A1C  09/14/2024   Diabetic kidney evaluation - Urine ACR  12/06/2024   Diabetic kidney evaluation - eGFR measurement  04/20/2025   Medicare Annual Wellness (AWV)  05/14/2025   Pneumococcal Vaccine: 50+ Years  Completed   HPV VACCINES  Aged Out   Meningococcal B Vaccine  Aged Out   Lung Cancer Screening  Discontinued   COVID-19 Vaccine  Discontinued   Hepatitis C Screening  Discontinued   Zoster Vaccines- Shingrix  Discontinued    Health Maintenance Items Addressed: Diabetic Foot Exam recommended, Due for TDAP vaccine  Additional Screening:  Vision Screening: Recommended annual ophthalmology exams for early detection of glaucoma and other disorders of the eye. Is the patient up to date with their annual eye exam?  Yes  Who is the provider or what is the name of the office in which the patient attends annual eye exams? [facility]  Dental Screening: Recommended annual dental exams for proper oral hygiene  Community Resource Referral / Chronic Care Management: CRR required this visit?  No   CCM required this visit?  No   Plan:    I have personally reviewed and noted the following in the patient's chart:   Medical and social history Use of alcohol, tobacco or illicit drugs  Current medications and supplements including opioid prescriptions. Patient is currently taking opioid prescriptions. Information provided to patient regarding non-opioid alternatives. Patient advised to discuss non-opioid treatment plan with their provider. Functional ability and status Nutritional status Physical activity Advanced directives List of other physicians Hospitalizations, surgeries, and ER visits in previous 12 months Vitals Screenings to include cognitive, depression, and falls Referrals and appointments  In addition, I have reviewed and discussed with patient certain preventive protocols, quality metrics, and best practice  recommendations. A written personalized care plan for preventive services as well as general preventive health recommendations were provided to patient.   Ardella FORBES Dawn, LPN  05/14/2024   After Visit Summary: (MyChart) Due to this being a telephonic visit, the after visit summary with patients personalized plan was offered to patient via MyChart   Notes: Nothing significant to report at this time.

## 2024-05-14 NOTE — Patient Instructions (Signed)
 [name],  Thank you for taking the time for your Medicare Wellness Visit. I appreciate your continued commitment to your health goals. Please review the care plan we discussed, and feel free to reach out if I can assist you further.  Medicare recommends these wellness visits once per year to help you and your care team stay ahead of potential health issues. These visits are designed to focus on prevention, allowing your provider to concentrate on managing your acute and chronic conditions during your regular appointments.  Please note that Annual Wellness Visits do not include a physical exam. Some assessments may be limited, especially if the visit was conducted virtually. If needed, we may recommend a separate in-person follow-up with your provider.  Ongoing Care Seeing your primary care provider every 3 to 6 months helps us  monitor your health and provide consistent, personalized care.   Referrals If a referral was made during today's visit and you haven't received any updates within two weeks, please contact the referred provider directly to check on the status.  Recommended Screenings:  Health Maintenance  Topic Date Due   Complete foot exam   04/20/2023   DTaP/Tdap/Td vaccine (2 - Tdap) 02/25/2024   Colon Cancer Screening  06/07/2024   Flu Shot  06/21/2024*   Eye exam for diabetics  07/31/2024   Hemoglobin A1C  09/14/2024   Yearly kidney health urinalysis for diabetes  12/06/2024   Yearly kidney function blood test for diabetes  04/20/2025   Medicare Annual Wellness Visit  05/14/2025   Pneumococcal Vaccine for age over 85  Completed   HPV Vaccine  Aged Out   Meningitis B Vaccine  Aged Out   Screening for Lung Cancer  Discontinued   COVID-19 Vaccine  Discontinued   Hepatitis C Screening  Discontinued   Zoster (Shingles) Vaccine  Discontinued  *Topic was postponed. The date shown is not the original due date.       05/14/2024    9:27 AM  Advanced Directives  Does  Patient Have a Medical Advance Directive? No  Would patient like information on creating a medical advance directive? No - Patient declined   Advance Care Planning is important because it: Ensures you receive medical care that aligns with your values, goals, and preferences. Provides guidance to your family and loved ones, reducing the emotional burden of decision-making during critical moments.  Vision: Annual vision screenings are recommended for early detection of glaucoma, cataracts, and diabetic retinopathy. These exams can also reveal signs of chronic conditions such as diabetes and high blood pressure.  Dental: Annual dental screenings help detect early signs of oral cancer, gum disease, and other conditions linked to overall health, including heart disease and diabetes.  Please see the attached documents for additional preventive care recommendations.

## 2024-05-16 ENCOUNTER — Other Ambulatory Visit (HOSPITAL_BASED_OUTPATIENT_CLINIC_OR_DEPARTMENT_OTHER): Payer: Self-pay

## 2024-05-20 ENCOUNTER — Encounter: Admitting: Physical Therapy

## 2024-05-22 ENCOUNTER — Encounter: Admitting: Physical Therapy

## 2024-05-25 ENCOUNTER — Other Ambulatory Visit (HOSPITAL_BASED_OUTPATIENT_CLINIC_OR_DEPARTMENT_OTHER): Payer: Self-pay

## 2024-05-27 ENCOUNTER — Other Ambulatory Visit (HOSPITAL_BASED_OUTPATIENT_CLINIC_OR_DEPARTMENT_OTHER): Payer: Self-pay

## 2024-05-30 ENCOUNTER — Ambulatory Visit: Admitting: Orthopaedic Surgery

## 2024-05-30 ENCOUNTER — Encounter: Payer: Self-pay | Admitting: Orthopaedic Surgery

## 2024-05-30 DIAGNOSIS — Z96641 Presence of right artificial hip joint: Secondary | ICD-10-CM

## 2024-05-30 NOTE — Progress Notes (Signed)
 The patient comes in today at 6 weeks status post a right total hip replacement to treat significant right hip pain and arthritis.  He is mobilizing well.  He does ambulate with a cane.  He says his hip does hurt quite a bit when he is stepping up onto a running board of a truck yesterday and the hip buckled a little bit but otherwise he is doing well.  Examination of his right hip shows it is smoothly and fluidly.  There are some scabbing along his incision but overall it looks pretty good.  His leg lengths are equal.  His left hip moves smoothly and fluidly.  From our standpoint the next time we need to see him is not for 6 months unless there are issues.  Will have a standing [facility] pelvis and lateral of his right hip at that visit.  If there are issues before then he knows to let us  know.

## 2024-06-03 ENCOUNTER — Encounter: Admitting: Physical Therapy

## 2024-06-03 ENCOUNTER — Telehealth: Payer: Self-pay | Admitting: Physical Therapy

## 2024-06-03 NOTE — Telephone Encounter (Signed)
 Called patient this afternoon regarding his missed 12:15 physical therapy appointment.  Spoke to the patient and he simply had his days mixed up -he plans to come to his next appointment on Wednesday 10/8.   Delon Norma, PT 06/03/24 12:37 PM Phone: [phone] Fax: [phone]

## 2024-06-05 ENCOUNTER — Ambulatory Visit: Admitting: Physical Therapy

## 2024-06-05 ENCOUNTER — Other Ambulatory Visit (HOSPITAL_BASED_OUTPATIENT_CLINIC_OR_DEPARTMENT_OTHER): Payer: Self-pay

## 2024-06-05 ENCOUNTER — Encounter: Payer: Self-pay | Admitting: Physical Therapy

## 2024-06-05 DIAGNOSIS — R262 Difficulty in walking, not elsewhere classified: Secondary | ICD-10-CM

## 2024-06-05 DIAGNOSIS — Z96641 Presence of right artificial hip joint: Secondary | ICD-10-CM

## 2024-06-05 MED ORDER — FLUZONE HIGH-DOSE 0.5 ML IM SUSY
0.5000 mL | PREFILLED_SYRINGE | Freq: Once | INTRAMUSCULAR | 0 refills | Status: AC
  Filled 2024-06-05: qty 0.5, 1d supply, fill #0

## 2024-06-05 MED ORDER — COMIRNATY 30 MCG/0.3ML IM SUSY
0.3000 mL | PREFILLED_SYRINGE | Freq: Once | INTRAMUSCULAR | 0 refills | Status: AC
  Filled 2024-06-05: qty 0.3, 1d supply, fill #0

## 2024-06-05 NOTE — Therapy (Signed)
 OUTPATIENT PHYSICAL THERAPY LOWER EXTREMITY EVALUATION   Patient Name: Shaun [name] MRN: [id number] DOB:August 12, 1956, 68 y.o., male Today's Date: 06/05/2024  END OF SESSION:  PT End of Session - 06/05/24 1211     Visit Number 2    Number of Visits 8    Date for Recertification  06/11/24    Authorization Type [employer] MCR    PT Start Time 1215    PT Stop Time 1300    PT Time Calculation (min) 45 min          Past Medical History:  Diagnosis Date   A-fib (HCC)    Anginal pain    Anxiety    Bacterial URI 08/29/2022   CAD (coronary artery disease)    DES to distal circumflex 2016   Cataract    CHF (congestive heart failure) (HCC)    Colon polyps    30 colon polyps found on first colonoscopy   Complication of anesthesia    COPD (chronic obstructive pulmonary disease) (HCC)    Depression    Diastolic heart failure (HCC)    Diverticulitis    DJD (degenerative joint disease)    Dyspnea    Dysrhythmia    A-Fib   Genetic testing 09/09/2014   Negative genetic testing on the ColoNext panel test and the MSH2 inversion testing.  The ColoNext gene panel offered by [facility] and includes sequencing and rearrangement analysis for the following 17 genes: APC, BMPR1A, CDH1, CHEK2, EPCAM, GREM1, MLH1, MSH2, MSH6, MUTYH, PMS2, POLD1, POLE, PTEN, SMAD4, STK11, and TP53.   The report date is 09/08/14.      GERD (gastroesophageal reflux disease)    History of kidney stones    Hyperlipidemia    Hypernatremia 08/22/2021   Hypertension    Insomnia    Obstructive sleep apnea 12/2009   01/26/2010 AHI 83/hr   Permanent atrial fibrillation (HCC)    Onset 2006 paroxysmal then progressive to persistent   Pneumonia    PONV (postoperative nausea and vomiting)    PUD (peptic ulcer disease)    1980s   RLS (restless legs syndrome)    Sinusitis    Skin cancer    Tremor    Type 2 diabetes mellitus (HCC)    Past Surgical History:  Procedure Laterality Date   BIOPSY  07/17/2018    Procedure: BIOPSY;  Surgeon: Harvey Margo CROME, MD;  Location: [facility];  Service: Endoscopy;;  colon   BOWEL RESECTION  09/17/2018   SMALL BOWEL RESECTION: 71 CM    CARDIAC CATHETERIZATION N/A 07/21/2015   Procedure: Left Heart Cath and Coronary Angiography;  Surgeon: Peter M Swaziland, MD;  Location: [facility] INVASIVE CV LAB;  Service: Cardiovascular;  Laterality: N/A;   CARDIAC CATHETERIZATION N/A 07/21/2015   Procedure: Coronary Stent Intervention;  Surgeon: Peter M Swaziland, MD;  Location: [facility] INVASIVE CV LAB;  Service: Cardiovascular;  Laterality: N/A;   CIRCUMCISION N/A 04/05/2019   Procedure: CIRCUMCISION ADULT;  Surgeon: Watt Rush, MD;  Location: [facility];  Service: Urology;  Laterality: N/A;   COLONOSCOPY N/A 05/19/2014   Dr. Sharla diverticulosis/moderate external hemorrhoids, >20 simple adenomas. Genetic screening negative.    COLONOSCOPY WITH PROPOFOL  N/A 07/17/2018   Dr. harvey: Diverticulosis, external/internal hemorrhoids, 32 colon polyps removed.  ten tubular adenomas removed with no high-grade dysplasia.  Advised to have surveillance colonoscopy in 3 years.   COLONOSCOPY WITH PROPOFOL  N/A 06/07/2021   Procedure: COLONOSCOPY WITH PROPOFOL ;  Surgeon: Cindie Carlin POUR, DO;  Location: [facility];  Service: Endoscopy;  Laterality: N/A;  9:30 / ASA 3  (Pt was told that his time will be given at Pre-op)   CORONARY PRESSURE/FFR STUDY Left 06/08/2017   Procedure: INTRAVASCULAR PRESSURE WIRE/FFR STUDY;  Surgeon: Mady Bruckner, MD;  Location: [facility] INVASIVE CV LAB;  Service: Cardiovascular;  Laterality: Left;  LAD and CFX   ESOPHAGOGASTRODUODENOSCOPY (EGD) WITH PROPOFOL  N/A 07/17/2018   Dr. harvey: Low-grade narrowing Schatzki ring at the GE junction status post dilation.  Gastritis.  Biopsy with mild nonspecific reactive gastropathy.  No H. pylori.   GIVENS CAPSULE STUDY N/A 06/24/2019   normal   HERNIA REPAIR  1986   Left inguinal   INCISIONAL HERNIA REPAIR N/A 10/19/2022   Procedure:  OPEN INCISIONAL HERNIA REPAIR;  Surgeon: Dasie Leonor CROME, MD;  Location: [facility];  Service: General;  Laterality: N/A;   INSERTION OF MESH N/A 10/19/2022   Procedure: INSERTION OF MESH;  Surgeon: Dasie Leonor CROME, MD;  Location: [facility] OR;  Service: General;  Laterality: N/A;   LAPAROTOMY N/A 09/17/2018   Procedure: EXPLORATORY LAPAROTOMY;  Surgeon: Kallie Manuelita BROCKS, MD;  Location: [facility];  Service: General;  Laterality: N/A;   LEFT HEART CATH AND CORONARY ANGIOGRAPHY N/A 06/08/2017   Procedure: LEFT HEART CATH AND CORONARY ANGIOGRAPHY;  Surgeon: Mady Bruckner, MD;  Location: [facility] INVASIVE CV LAB;  Service: Cardiovascular;  Laterality: N/A;   POLYPECTOMY  07/17/2018   Procedure: POLYPECTOMY;  Surgeon: harvey Margo CROME, MD;  Location: [facility];  Service: Endoscopy;;  colon   POLYPECTOMY  06/07/2021   Procedure: POLYPECTOMY INTESTINAL;  Surgeon: Cindie Carlin POUR, DO;  Location: [facility];  Service: Endoscopy;;   RIGHT/LEFT HEART CATH AND CORONARY ANGIOGRAPHY N/A 09/19/2023   Procedure: RIGHT/LEFT HEART CATH AND CORONARY ANGIOGRAPHY;  Surgeon: Verlin Bruckner BIRCH, MD;  Location: [facility] INVASIVE CV LAB;  Service: Cardiovascular;  Laterality: N/A;   ROTATOR CUFF REPAIR     Right   SAVORY DILATION N/A 07/17/2018   Procedure: SAVORY DILATION;  Surgeon: harvey Margo CROME, MD;  Location: [facility];  Service: Endoscopy;  Laterality: N/A;   TOTAL HIP ARTHROPLASTY Right 04/19/2024   Procedure: ARTHROPLASTY, HIP, TOTAL, ANTERIOR APPROACH;  Surgeon: Vernetta Bruckner GRADE, MD;  Location: [facility];  Service: Orthopedics;  Laterality: Right;  Needs RNFA   Patient Active Problem List   Diagnosis Date Noted   Chronic pain syndrome 05/10/2024   S/P total right hip arthroplasty 04/19/2024   Status post total replacement of right hip 04/19/2024   Vitamin D  deficiency 12/05/2023   Smoking trying to quit 12/05/2023   Constipation 09/12/2023   GERD (gastroesophageal reflux disease)    Tremor 11/09/2022   SBO  (small bowel obstruction) (HCC) 10/29/2022   Thyroid  dysfunction 05/31/2022   Current moderate episode of major depressive disorder without prior episode (HCC) 04/19/2022   Debility 10/21/2021   Sundowning 08/23/2021   Dysphagia 08/22/2021   Obesity (BMI 30-39.9) 08/21/2021   Ventral incisional hernia without obstruction or gangrene 12/31/2020   Dyslipidemia, goal LDL below 70 06/18/2019   Anemia 10/17/2018   Phimosis of penis 09/27/2018   PUD (peptic ulcer disease) 05/21/2018   RLS (restless legs syndrome)    Insomnia    Chronic diastolic CHF (congestive heart failure) (HCC)    Asthma    Hypercoagulable state 01/27/2017   COPD (chronic obstructive pulmonary disease) (HCC) 07/20/2015   Coronary artery disease of native artery of native heart with stable angina pectoris 07/19/2015   Encounter for annual physical exam 09/09/2014  Hx of adenomatous colonic polyps 08/11/2014   Type 2 diabetes mellitus with hyperglycemia, without long-term current use of insulin  (HCC) 02/09/2011   CAD S/P percutaneous coronary angioplasty    Hypertension associated with chronic kidney disease due to type 2 diabetes mellitus (HCC)    Chronic atrial fibrillation (HCC) 02/23/2010   Obstructive sleep apnea 12/27/2009    PCP: Oris Credit NP   REFERRING PROVIDER: Vernetta Bruckner MD   REFERRING DIAG: [phone] (ICD-10-CM) - Status post total replacement of right hip  THERAPY DIAG:  Status post right hip replacement  Difficulty in walking, not elsewhere classified  Rationale for Evaluation and Treatment: Rehabilitation  ONSET DATE: 04/19/24  SUBJECTIVE:   SUBJECTIVE STATEMENT:  I did some weed eating yesterday and he is a little sore but nothing bad.    Patient presents to PT s/p Rt THA which has been improving since the surgery.  He is here to just be sure he is doing everything right and to be sure he is on track.  He reports very minimal to no pain in the past week.  He reports min  difficulty with the following activities: walking, standing, home tasks and work activities.   Operated leg has swelling since the surgery, but he notices an improvement. He uses the walker in the house for the most part, but has tried walking with the cane and even some without any device. He reports he has not been following any hip precautions per se but he is cautious and making good decisions.    PERTINENT HISTORY   A-fib   PAIN:  Are you having pain? Yes: NPRS scale: 1/10-2/10 Pain location: Rt lateral hip and thigh  Pain description: discomfort Aggravating factors: not having much  Relieving factors: resting, takes Oxy   PRECAUTIONS: Anterior hip   RED FLAGS: Would like None   WEIGHT BEARING RESTRICTIONS: No  FALLS:  Has patient fallen in last 6 months? No  LIVING ENVIRONMENT: Lives with: lives with their family Lives in: House/apartment Stairs: Yes: External: 1 steps; on right going up Has following equipment at home: Single point cane and Walker - 2 wheeled  OCCUPATION: not working   PLOF: Independent with basic ADLs, Independent with household mobility with device, Independent with community mobility with device, and Leisure: Likes to work in the yard and around the house, play with his grandkids  PATIENT GOALS: I want to be able to climb a ladder work in the yard, baseball   NEXT MD VISIT: Follow up 2-3 weeks   OBJECTIVE:  Note: Objective measures were completed at Evaluation unless otherwise noted.  DIAGNOSTIC FINDINGS: none  PATIENT SURVEYS:  LEFS  Extreme difficulty/unable (0), Quite a bit of difficulty (1), Moderate difficulty (2), Little difficulty (3), No difficulty (4) Survey date:  05/14/24  Any of your usual work, housework or school activities 2  2. Usual hobbies, recreational or sporting activities 2  3. Getting into/out of the bath 1  4. Walking between rooms 4  5. Putting on socks/shoes 3  6. Squatting  2  7. Lifting an object, like a bag  of groceries from the floor 4  8. Performing light activities around your home 3  9. Performing heavy activities around your home 1  10. Getting into/out of a car 3  11. Walking 2 blocks 3  12. Walking 1 mile 0  13. Going up/down 10 stairs (1 flight) 0  14. Standing for 1 hour 1   15.  sitting for 1 hour 4  16. Running on even ground 0  17. Running on uneven ground 0  18. Making sharp turns while running fast 0  19. Hopping  0  20. Rolling over in bed   Score total:  33/80     COGNITION: Overall cognitive status: Within functional limits for tasks assessed     SENSATION: WFL  EDEMA:  R 52 cm, L 49 cm   POSTURE: Rt pelvis retracted in standing, mild forward head and kyphosis in thoracic spine loss of lumbar lordosis  PALPATION: TTP lateral hip and thigh surrounding the incision  LOWER EXTREMITY ROM:  Passive ROM Right eval Left eval  Hip flexion 100   Hip extension    Hip abduction    Hip adduction    Hip internal rotation 20 30  Hip external rotation 25 45  Knee flexion    Knee extension    Ankle dorsiflexion    Ankle plantarflexion    Ankle inversion    Ankle eversion     (Blank rows = not tested)  LOWER EXTREMITY MMT:  MMT Right eval Left eval  Hip flexion 4 5  Hip extension    Hip abduction    Hip adduction    Hip internal rotation    Hip external rotation    Knee flexion 5 5  Knee extension 5 5  Ankle dorsiflexion    Ankle plantarflexion    Ankle inversion    Ankle eversion     (Blank rows = not tested)  LOWER EXTREMITY SPECIAL TESTS:   NT    FUNCTIONAL TESTS:  5 times sit to stand: 18 2 minute walk test: NT   Step ups, tandem and hip abduction in standing   Heel raises x 10   GAIT: Distance walked: 150 feet with mod I  Assistive device utilized: Single point cane Level of assistance: SBA Comments: mod I with RW                                                                                                                                  TREATMENT DATE:   [facility] Adult PT Treatment:                                                DATE: 06/05/24 Therapeutic Exercise: Sit to stand x 30 sec  Heel raises  Rt hip flexor stretch standing  x 3 x 30  Quad set x 5 to SLR x 10  (Rt quad cramping)  Bridging  Knee to chest /piriformis  Sidelying clam 2 x 10  Step ups  HIp abduction x 15  Wall squats x 10  and sits 15 sec x 3   Therapeutic Activity: BERG BALANCE  Sitting to Standing: Numbers; 0-4: 4  4. Stands without using hands and stabilize independently  3. Stands independently using hands  2. Stands using hands after multiple trials  1. Min A to stand  0. Mod-Max A to stand Standing unsupported: Numbers; 0-4: 4  4. Stands safely for 2 minutes  3. Stands 2 minutes with supervision  2. Stands 30 seconds unsupported  1. Needs several tries to stand unsupported for 30 seconds  0. Unable to stand unsupported for 30 seconds Sitting unsupported: Numbers; 0-4: 4  4. Sits for 2 minutes independently  3. Sits for 2 minutes with supervision  2. Able to sit 30 seconds  1. Able to sit 10 seconds  0. Unable to sit for 10 seconds Standing to Sitting: Numbers; 0-4: 4 4. Sits safely with minimal use of hands 3. Controls descent with hands 2. Uses back of legs against chair to control descent 1. Sits independently, but uncontrolled descent 0. Needs assistance Transfers: Numbers; 0-4: 4  4. Transfers safely with minor use of hands  3. Transfers safely definite use of hands  2. Transfers with verbal cueing/supervision  1. Needs 1 person assist  0. Needs 2 person assist  Standing with eyes closed: Numbers; 0-4: 4  4. Stands safely for 10 seconds  3. Stands 10 seconds with supervision   2. Able to stand for 3 seconds  1. Unable to keep eyes closed for 3 seconds, but is safe  0. Needs assist to keep from falling Standing with feet together: Numbers; 0-4: 4 4. Stands for 1 minute safely 3. Stands for 1 minute with  supervision 2. Unable to hold for 30 seconds  1. Needs help to attain position but can hold for 15 seconds  0. Needs help to attain position and unable to hold for 15 seconds Reaching forward with outstretched arm: Numbers; 0-4: 4  4. Reaches forward 10 inches  3. Reaches forward 5 inches  2. Reaches forward 2 inches  1. Reaches forward with supervision  0. Loses balance/requires assistace Retrieving object from the floor: Numbers; 0-4: 4 4. Able to pick up easily and safely 3. Able to pick up with supervision 2. Unable to pick up, but reaches within 1-2 inches independently 1. Unable to pick up and needs supervision 0. Unable/needs assistance to keep from falling  Turning to look behind: Numbers; 0-4: 4  4. Looks behind from both sides and weight shifts well  3. Looks behind one side only, other side less weight shift  2. Turns sideways only, maintains balance  1. Needs supervision when turning  0. Needs assistance  Turning 360 degrees: Numbers; 0-4: 4  4. Able to turn in </=4 seconds  3. Able to turn on one side in </= 4 seconds   2. Able to turn slowly, but safely  1. Needs supervision or verbal cueing  0. Needs assistance Place alternate foot on stool: Numbers; 0-4: 4 4. Completes 8 steps in 20 seconds 3. Completes 8 steps in >20 seconds 2. 4 steps without assistance/supervision 1. Completes >2 steps with minimal assist 0. Unable, needs assist to keep from falling Standing with one foot in front: Numbers; 0-4: 4  4. Independent tandem for 30 seconds  3. Independent foot ahead for 30 seconds  2. Independent small step for 30 seconds  1. Needs help to step, but can hold for 15 seconds  0. Loses balance while standing/stepping Standing on one foot: Numbers; 0-4: 4 4. Holds >10 seconds 3. Holds 5-10 seconds 2. Holds >/=3 seconds  1. Holds <3 seconds 0. Unable   Total Score: 56/56  Self Care: HEP    [facility] Adult PT Treatment:                                                 DATE: 05/14/2024 Self Care: Gait, balance, safety, hip precautions    Driving  HEP   PATIENT EDUCATION:  Education details: see above  Person educated: Patient Education method: Explanation, Demonstration, and Verbal cues Education comprehension: verbalized understanding and needs further education  HOME EXERCISE PROGRAM: Access Code: [email or web address] URL: [email or web address] Date: 06/05/2024 Prepared by: Delon Norma  Exercises - Sit to Stand Without Arm Support  - 1 x daily - 7 x weekly - 2 sets - 10 reps - Hooklying Single Knee to Chest Stretch  - 1 x daily - 7 x weekly - 1 sets - 5 reps - 30 hold - Supine Piriformis Stretch with Foot on Ground  - 1 x daily - 7 x weekly - 1 sets - 5 reps - 30 hold - Supine Bridge  - 1 x daily - 7 x weekly - 2 sets - 10 reps - 5 hold - Clamshell  - 1 x daily - 7 x weekly - 2 sets - 10 reps - 5 hold - Standing Hip Flexor Stretch  - 1 x daily - 7 x weekly - 1 sets - 5 reps - 30 hold ASSESSMENT:  CLINICAL IMPRESSION: Patient was given an intermediate HEP for Rt hip focusing on hip mobility and glute strength.  He tolerates exercises well other than a resisted sidelying clam.  Pt has not done much of any hip external rotation stretching since the   He uses MHP intermittently at home. Pt scored 56/56 on Berg, will try the DGI next time for further assessment.   Patient is a 68 y.o. male  who was seen today for physical therapy evaluation and treatment for Rt THA. He has been cleared to drive and I believe he is safe to do this.   OBJECTIVE IMPAIRMENTS: decreased coordination, decreased endurance, decreased knowledge of use of DME, decreased mobility, difficulty walking, decreased ROM, decreased strength, increased edema, impaired flexibility, impaired sensation, improper body mechanics, postural dysfunction, obesity, and pain.   ACTIVITY LIMITATIONS: lifting, bending, standing, squatting, sleeping, transfers, and locomotion  level  PARTICIPATION LIMITATIONS: driving, community activity, and yard work  PERSONAL FACTORS: 1-2 comorbidities: atrial fib, CHF/COPD are also affecting patient's functional outcome.   REHAB POTENTIAL: Excellent  CLINICAL DECISION MAKING: Stable/uncomplicated  EVALUATION COMPLEXITY: Low   GOALS: Goals reviewed with patient? Yes   SHORT TERM GOAL= LONG TERM GOALS: Target date: 06/11/2024    Patient will be independent with final HEP upon discharge from PT and report consistent benefit following exercise completion.    Baseline:  Goal status: INITIAL  2.  Patient will be able to ambulate with cane in the community over 300 feet safely with no loss of balance, modified independent Baseline:  Goal status: INITIAL  3.  Patient will demonstrate 4+/5 hip extension and abduction strength in order to maximize stability with gait and stair negotiation Baseline:  Goal status: INITIAL  4.  Patient will be able to perform single-leg balance on operated leg static for 15 sec or more  Baseline:  Goal status: INITIAL  5.  Patient will be able to cross his right leg over the left in order to complete lower body dressing Baseline:  Goal status: INITIAL  6.  Endurance goals to be assessed at first treatment visit Baseline:  Goal status: INITIAL   PLAN:  PT FREQUENCY: 1-2x/week  PT DURATION: 4 weeks  PLANNED INTERVENTIONS: 97164- PT Re-evaluation, 97750- Physical Performance Testing, 97110-Therapeutic exercises, 97530- Therapeutic activity, 97112- Neuromuscular re-education, 97535- Self Care, [id number]- Manual therapy, [phone]- Gait training, Patient/Family education, Balance training, Stair training, DME instructions, Cryotherapy, and Moist heat  PLAN FOR NEXT SESSION: DGI, Balance assessment, gait with cane develop and establish home program for lower body mobility and strength   Karlis Cregg, PT 06/05/2024, 12:12 PM   Delon Norma, PT 06/05/24 12:56 PM Phone: [phone] Fax:  [phone]

## 2024-06-06 ENCOUNTER — Other Ambulatory Visit: Payer: Self-pay | Admitting: Nurse Practitioner

## 2024-06-06 DIAGNOSIS — M2550 Pain in unspecified joint: Secondary | ICD-10-CM

## 2024-06-06 NOTE — Telephone Encounter (Signed)
 Last apt 05/10/24.

## 2024-06-09 NOTE — Therapy (Unsigned)
 OUTPATIENT PHYSICAL THERAPY LOWER EXTREMITY EVALUATION   Patient Name: Shaun [name] MRN: [id number] DOB:04/25/1956, 68 y.o., male Today's Date: 06/10/2024  END OF SESSION:  PT End of Session - 06/10/24 1304     Visit Number 3    Number of Visits 8    Date for Recertification  06/11/24    Authorization Type [employer] MCR    PT Start Time 1300    PT Stop Time 1345    PT Time Calculation (min) 45 min    Equipment Utilized During Treatment Gait belt    Activity Tolerance Patient tolerated treatment well    Behavior During Therapy WFL for tasks assessed/performed           Past Medical History:  Diagnosis Date   A-fib (HCC)    Anginal pain    Anxiety    Bacterial URI 08/29/2022   CAD (coronary artery disease)    DES to distal circumflex 2016   Cataract    CHF (congestive heart failure) (HCC)    Colon polyps    30 colon polyps found on first colonoscopy   Complication of anesthesia    COPD (chronic obstructive pulmonary disease) (HCC)    Depression    Diastolic heart failure (HCC)    Diverticulitis    DJD (degenerative joint disease)    Dyspnea    Dysrhythmia    A-Fib   Genetic testing 09/09/2014   Negative genetic testing on the ColoNext panel test and the MSH2 inversion testing.  The ColoNext gene panel offered by [facility] and includes sequencing and rearrangement analysis for the following 17 genes: APC, BMPR1A, CDH1, CHEK2, EPCAM, GREM1, MLH1, MSH2, MSH6, MUTYH, PMS2, POLD1, POLE, PTEN, SMAD4, STK11, and TP53.   The report date is 09/08/14.      GERD (gastroesophageal reflux disease)    History of kidney stones    Hyperlipidemia    Hypernatremia 08/22/2021   Hypertension    Insomnia    Obstructive sleep apnea 12/2009   01/26/2010 AHI 83/hr   Permanent atrial fibrillation (HCC)    Onset 2006 paroxysmal then progressive to persistent   Pneumonia    PONV (postoperative nausea and vomiting)    PUD (peptic ulcer disease)    1980s   RLS (restless legs  syndrome)    Sinusitis    Skin cancer    Tremor    Type 2 diabetes mellitus (HCC)    Past Surgical History:  Procedure Laterality Date   BIOPSY  07/17/2018   Procedure: BIOPSY;  Surgeon: Harvey Margo CROME, MD;  [facility]: [facility];  Service: Endoscopy;;  colon   BOWEL RESECTION  09/17/2018   SMALL BOWEL RESECTION: 71 CM    CARDIAC CATHETERIZATION N/A 07/21/2015   Procedure: Left Heart Cath and Coronary Angiography;  Surgeon: Peter M Swaziland, MD;  Location: [facility] INVASIVE CV LAB;  Service: Cardiovascular;  Laterality: N/A;   CARDIAC CATHETERIZATION N/A 07/21/2015   Procedure: Coronary Stent Intervention;  Surgeon: Peter M Swaziland, MD;  Location: [facility] INVASIVE CV LAB;  Service: Cardiovascular;  Laterality: N/A;   CIRCUMCISION N/A 04/05/2019   Procedure: CIRCUMCISION ADULT;  Surgeon: Watt Rush, MD;  Location: [facility];  Service: Urology;  Laterality: N/A;   COLONOSCOPY N/A 05/19/2014   Dr. Sharla diverticulosis/moderate external hemorrhoids, >20 simple adenomas. Genetic screening negative.    COLONOSCOPY WITH PROPOFOL  N/A 07/17/2018   Dr. harvey: Diverticulosis, external/internal hemorrhoids, 32 colon polyps removed.  ten tubular adenomas removed with no high-grade dysplasia.  Advised to have surveillance  colonoscopy in 3 years.   COLONOSCOPY WITH PROPOFOL  N/A 06/07/2021   Procedure: COLONOSCOPY WITH PROPOFOL ;  Surgeon: Cindie Carlin POUR, DO;  Location: [facility];  Service: Endoscopy;  Laterality: N/A;  9:30 / ASA 3  (Pt was told that his time will be given at Pre-op)   CORONARY PRESSURE/FFR STUDY Left 06/08/2017   Procedure: INTRAVASCULAR PRESSURE WIRE/FFR STUDY;  Surgeon: Mady Bruckner, MD;  Location: [facility] INVASIVE CV LAB;  Service: Cardiovascular;  Laterality: Left;  LAD and CFX   ESOPHAGOGASTRODUODENOSCOPY (EGD) WITH PROPOFOL  N/A 07/17/2018   Dr. harvey: Low-grade narrowing Schatzki ring at the GE junction status post dilation.  Gastritis.  Biopsy with mild nonspecific reactive  gastropathy.  No H. pylori.   GIVENS CAPSULE STUDY N/A 06/24/2019   normal   HERNIA REPAIR  1986   Left inguinal   INCISIONAL HERNIA REPAIR N/A 10/19/2022   Procedure: OPEN INCISIONAL HERNIA REPAIR;  Surgeon: Dasie Leonor CROME, MD;  Location: [facility] OR;  Service: General;  Laterality: N/A;   INSERTION OF MESH N/A 10/19/2022   Procedure: INSERTION OF MESH;  Surgeon: Dasie Leonor CROME, MD;  Location: [facility] OR;  Service: General;  Laterality: N/A;   LAPAROTOMY N/A 09/17/2018   Procedure: EXPLORATORY LAPAROTOMY;  Surgeon: Kallie Manuelita BROCKS, MD;  Location: [facility];  Service: General;  Laterality: N/A;   LEFT HEART CATH AND CORONARY ANGIOGRAPHY N/A 06/08/2017   Procedure: LEFT HEART CATH AND CORONARY ANGIOGRAPHY;  Surgeon: Mady Bruckner, MD;  Location: [facility] CV LAB;  Service: Cardiovascular;  Laterality: N/A;   POLYPECTOMY  07/17/2018   Procedure: POLYPECTOMY;  Surgeon: harvey Margo CROME, MD;  [facility];  Service: Endoscopy;;  colon   POLYPECTOMY  06/07/2021   Procedure: POLYPECTOMY INTESTINAL;  Surgeon: Cindie Carlin POUR, DO;  [facility];  Service: Endoscopy;;   RIGHT/LEFT HEART CATH AND CORONARY ANGIOGRAPHY N/A 09/19/2023   Procedure: RIGHT/LEFT HEART CATH AND CORONARY ANGIOGRAPHY;  Surgeon: Verlin Bruckner BIRCH, MD;  Location: [facility]VAS[facility] CV LAB;  Service: Cardiovascular;  Laterality: N/A;   ROTATOR CUFF REPAIR     Right   SAVORY DILATION N/A 07/17/2018   Procedure: SAVORY DILATION;  Surgeon: harvey Margo CROME, MD;  Location: [facility];  Service: Endoscopy;  Laterality: N/A;   TOTAL HIP ARTHROPLASTY Right 04/19/2024   Procedure: ARTHROPLASTY, HIP, TOTAL, ANTERIOR APPROACH;  Surgeon: Vernetta Bruckner GRADE, MD;  Location: [facility];  Service: Orthopedics;  Laterality: Right;  Needs RNFA   Patient Active Problem List   Diagnosis Date Noted   Chronic pain syndrome 05/10/2024   S/P total right hip arthroplasty 04/19/2024   Status post total replacement of right hip  04/19/2024   Vitamin D  deficiency 12/05/2023   Smoking trying to quit 12/05/2023   Constipation 09/12/2023   GERD (gastroesophageal reflux disease)    Tremor 11/09/2022   SBO (small bowel obstruction) (HCC) 10/29/2022   Thyroid  dysfunction 05/31/2022   Current moderate episode of major depressive disorder without prior episode (HCC) 04/19/2022   Debility 10/21/2021   Sundowning 08/23/2021   Dysphagia 08/22/2021   Obesity (BMI 30-39.9) 08/21/2021   Ventral incisional hernia without obstruction or gangrene 12/31/2020   Dyslipidemia, goal LDL below 70 06/18/2019   Anemia 10/17/2018   Phimosis of penis 09/27/2018   PUD (peptic ulcer disease) 05/21/2018   RLS (restless legs syndrome)    Insomnia    Chronic diastolic CHF (congestive heart failure) (HCC)    Asthma    Hypercoagulable state 01/27/2017   COPD (chronic  obstructive pulmonary disease) (HCC) 07/20/2015   Coronary artery disease of native artery of native heart with stable angina pectoris 07/19/2015   Encounter for annual physical exam 09/09/2014   Hx of adenomatous colonic polyps 08/11/2014   Type 2 diabetes mellitus with hyperglycemia, without long-term current use of insulin  (HCC) 02/09/2011   CAD S/P percutaneous coronary angioplasty    Hypertension associated with chronic kidney disease due to type 2 diabetes mellitus (HCC)    Chronic atrial fibrillation (HCC) 02/23/2010   Obstructive sleep apnea 12/27/2009    PCP: Oris Credit NP   REFERRING PROVIDER: Vernetta Bruckner MD   REFERRING DIAG: [phone] (ICD-10-CM) - Status post total replacement of right hip  THERAPY DIAG:  Status post right hip replacement  Difficulty in walking, not elsewhere classified  Rationale for Evaluation and Treatment: Rehabilitation  ONSET DATE: 04/19/24  SUBJECTIVE:   SUBJECTIVE STATEMENT:  My knee is killing me.  It think it was that wall squat.     I did some weed eating yesterday and he is a little sore but nothing bad.     Patient presents to PT s/p Rt THA which has been improving since the surgery.  He is here to just be sure he is doing everything right and to be sure he is on track.  He reports very minimal to no pain in the past week.  He reports min difficulty with the following activities: walking, standing, home tasks and work activities.   Operated leg has swelling since the surgery, but he notices an improvement. He uses the walker in the house for the most part, but has tried walking with the cane and even some without any device. He reports he has not been following any hip precautions per se but he is cautious and making good decisions.    PERTINENT HISTORY   A-fib   PAIN:  Are you having pain? Yes: NPRS scale: 4/10, no hip pain at all  Pain location: Rt lateral hip and thigh  Pain description: discomfort Aggravating factors: not having much  Relieving factors: resting, takes Oxy   PRECAUTIONS: Anterior hip   RED FLAGS: Would like None   WEIGHT BEARING RESTRICTIONS: No  FALLS:  Has patient fallen in last 6 months? No  LIVING ENVIRONMENT: Lives with: lives with their family Lives in: House/apartment Stairs: Yes: External: 1 steps; on right going up Has following equipment at home: Single point cane and Walker - 2 wheeled  OCCUPATION: not working   PLOF: Independent with basic ADLs, Independent with household mobility with device, Independent with community mobility with device, and Leisure: Likes to work in the yard and around the house, play with his grandkids  PATIENT GOALS: I want to be able to climb a ladder work in the yard, baseball   NEXT MD VISIT: Follow up 2-3 weeks   OBJECTIVE:  Note: Objective measures were completed at Evaluation unless otherwise noted.  DIAGNOSTIC FINDINGS: none  PATIENT SURVEYS:  LEFS   Score total:  33/80     COGNITION: Overall cognitive status: Within functional limits for tasks assessed     SENSATION: WFL  EDEMA:  R 52 cm, L 49  cm   POSTURE: Rt pelvis retracted in standing, mild forward head and kyphosis in thoracic spine loss of lumbar lordosis  PALPATION: TTP lateral hip and thigh surrounding the incision  LOWER EXTREMITY ROM:  Passive ROM Right eval Left eval  Hip flexion 100   Hip extension    Hip abduction  Hip adduction    Hip internal rotation 20 30  Hip external rotation 25 45  Knee flexion    Knee extension    Ankle dorsiflexion    Ankle plantarflexion    Ankle inversion    Ankle eversion     (Blank rows = not tested)  LOWER EXTREMITY MMT:  MMT Right eval Left eval  Hip flexion 4 5  Hip extension    Hip abduction    Hip adduction    Hip internal rotation    Hip external rotation    Knee flexion 5 5  Knee extension 5 5  Ankle dorsiflexion    Ankle plantarflexion    Ankle inversion    Ankle eversion     (Blank rows = not tested)  LOWER EXTREMITY SPECIAL TESTS:   NT    FUNCTIONAL TESTS:  5 times sit to stand: 18 2 minute walk test: NT   Step ups, tandem and hip abduction in standing   Heel raises x 10   GAIT: Distance walked: 150 feet with mod I  Assistive device utilized: Single point cane Level of assistance: SBA Comments: mod I with RW                                                                                                                                 TREATMENT DATE:   [facility] Adult PT Treatment:                                                DATE: 06/10/24 Therapeutic Exercise: Recumbent bike 5 min L1  Hip abduction, extension standing x 20  Sit to stand x 15 (10 lbs)  Standing core 10 lb carry with march  High march weight at chest, then suitcase x 30 sec  SLR hip flex, abduction Stretch hip flexor  Knee to chest x 3  Banded bridge  x 10 added clam  LTR wide feet  Hamstring stretch 60 sec  PROM Rt hip Rt LE  Sidelying hip abduction and clam x 15  Therapeutic Activity: DGI 24/24  PATIENT EDUCATION:  Education details: see above  Person  educated: Patient Education method: Explanation, Demonstration, and Verbal cues Education comprehension: verbalized understanding and needs further education  HOME EXERCISE PROGRAM: Access Code: [email or web address] URL: [email or web address] Date: 06/05/2024 Prepared by: Delon Norma  Exercises - Sit to Stand Without Arm Support  - 1 x daily - 7 x weekly - 2 sets - 10 reps - Hooklying Single Knee to Chest Stretch  - 1 x daily - 7 x weekly - 1 sets - 5 reps - 30 hold - Supine Piriformis Stretch with Foot on Ground  - 1 x daily - 7 x weekly - 1 sets - 5 reps - 30 hold - Supine Bridge  - 1 x  daily - 7 x weekly - 2 sets - 10 reps - 5 hold - Clamshell  - 1 x daily - 7 x weekly - 2 sets - 10 reps - 5 hold - Standing Hip Flexor Stretch  - 1 x daily - 7 x weekly - 1 sets - 5 reps - 30 hold  ASSESSMENT:  CLINICAL IMPRESSION: Patient scored 24/24 on the DGI.  His knee was aggravated, exercises modified for comfort. He needs cues to full extend hips with bridging.  Patient is lacking hip mobility in both external and internal rotation.  Right knee pain interferes with some standing exercises but only minimally.  Overall patient is progressing well with respect to his hip replacement and will continue to progress with skilled physical therapy.    Patient is a 68 y.o. male  who was seen today for physical therapy evaluation and treatment for Rt THA. He has been cleared to drive and I believe he is safe to do this.   OBJECTIVE IMPAIRMENTS: decreased coordination, decreased endurance, decreased knowledge of use of DME, decreased mobility, difficulty walking, decreased ROM, decreased strength, increased edema, impaired flexibility, impaired sensation, improper body mechanics, postural dysfunction, obesity, and pain.   ACTIVITY LIMITATIONS: lifting, bending, standing, squatting, sleeping, transfers, and locomotion level  PARTICIPATION LIMITATIONS: driving, community activity, and yard  work  PERSONAL FACTORS: 1-2 comorbidities: atrial fib, CHF/COPD are also affecting patient's functional outcome.   REHAB POTENTIAL: Excellent  CLINICAL DECISION MAKING: Stable/uncomplicated  EVALUATION COMPLEXITY: Low   GOALS: Goals reviewed with patient? Yes   SHORT TERM GOAL= LONG TERM GOALS: Target date: 06/11/2024    Patient will be independent with final HEP upon discharge from PT and report consistent benefit following exercise completion.    Baseline:  Goal status: ongoing   2.  Patient will be able to ambulate with cane in the community over 300 feet safely with no loss of balance, modified independent Baseline:  Goal status: ongoing- uses it daily for brief   3.  Patient will demonstrate 4+/5 hip extension and abduction strength in order to maximize stability with gait and stair negotiation Baseline:  Goal status: ongoing   4.  Patient will be able to perform single-leg balance on operated leg static for 15 sec or more  Baseline:  Goal status:ongoing   5.  Patient will be able to cross his right leg over the left in order to complete lower body dressing Baseline:  Goal status: ongoing     PLAN:  PT FREQUENCY: 1-2x/week  PT DURATION: 4 weeks  PLANNED INTERVENTIONS: [id number]- PT Re-evaluation, 97750- Physical Performance Testing, 97110-Therapeutic exercises, 97530- Therapeutic activity, 97112- Neuromuscular re-education, 97535- Self Care, [id number]- Manual therapy, [phone]- Gait training, Patient/Family education, Balance training, Stair training, DME instructions, Cryotherapy, and Moist heat  PLAN FOR NEXT SESSION: progress hip mobility and strength    Zayin Valadez, PT 06/10/2024, 1:06 PM   Delon Norma, PT 06/10/24 1:06 PM Phone: [phone] Fax: [phone]

## 2024-06-10 ENCOUNTER — Ambulatory Visit: Admitting: Physical Therapy

## 2024-06-10 ENCOUNTER — Encounter: Payer: Self-pay | Admitting: Physical Therapy

## 2024-06-10 DIAGNOSIS — R262 Difficulty in walking, not elsewhere classified: Secondary | ICD-10-CM

## 2024-06-10 DIAGNOSIS — Z96641 Presence of right artificial hip joint: Secondary | ICD-10-CM

## 2024-06-12 ENCOUNTER — Encounter: Admitting: Physical Therapy

## 2024-06-14 ENCOUNTER — Other Ambulatory Visit (HOSPITAL_BASED_OUTPATIENT_CLINIC_OR_DEPARTMENT_OTHER): Payer: Self-pay

## 2024-06-14 MED ORDER — BOOSTRIX 5-2.5-18.5 LF-MCG/0.5 IM SUSY
PREFILLED_SYRINGE | INTRAMUSCULAR | 0 refills | Status: DC
  Filled 2024-06-14: qty 0.5, 1d supply, fill #0

## 2024-06-17 ENCOUNTER — Encounter: Admitting: Physical Therapy

## 2024-06-18 ENCOUNTER — Other Ambulatory Visit (HOSPITAL_BASED_OUTPATIENT_CLINIC_OR_DEPARTMENT_OTHER): Payer: Self-pay

## 2024-06-18 ENCOUNTER — Other Ambulatory Visit: Payer: Self-pay | Admitting: Nurse Practitioner

## 2024-06-18 ENCOUNTER — Other Ambulatory Visit: Payer: Self-pay

## 2024-06-18 DIAGNOSIS — G2581 Restless legs syndrome: Secondary | ICD-10-CM

## 2024-06-18 MED ORDER — ROPINIROLE HCL 3 MG PO TABS
ORAL_TABLET | ORAL | 1 refills | Status: DC
  Filled 2024-06-21: qty 45, 30d supply, fill #0
  Filled 2024-07-21: qty 45, 30d supply, fill #1

## 2024-06-19 ENCOUNTER — Other Ambulatory Visit: Payer: Self-pay

## 2024-06-19 ENCOUNTER — Encounter: Admitting: Physical Therapy

## 2024-06-19 ENCOUNTER — Other Ambulatory Visit (HOSPITAL_BASED_OUTPATIENT_CLINIC_OR_DEPARTMENT_OTHER): Payer: Self-pay

## 2024-06-21 ENCOUNTER — Other Ambulatory Visit (HOSPITAL_BASED_OUTPATIENT_CLINIC_OR_DEPARTMENT_OTHER): Payer: Self-pay

## 2024-06-25 ENCOUNTER — Other Ambulatory Visit (HOSPITAL_BASED_OUTPATIENT_CLINIC_OR_DEPARTMENT_OTHER): Payer: Self-pay

## 2024-06-25 ENCOUNTER — Other Ambulatory Visit: Payer: Self-pay | Admitting: Nurse Practitioner

## 2024-06-25 DIAGNOSIS — M2550 Pain in unspecified joint: Secondary | ICD-10-CM

## 2024-06-25 NOTE — Telephone Encounter (Signed)
 Last appt. 05/10/24

## 2024-06-26 ENCOUNTER — Ambulatory Visit: Admitting: Physical Therapy

## 2024-06-26 ENCOUNTER — Other Ambulatory Visit (HOSPITAL_BASED_OUTPATIENT_CLINIC_OR_DEPARTMENT_OTHER): Payer: Self-pay

## 2024-06-26 DIAGNOSIS — Z96641 Presence of right artificial hip joint: Secondary | ICD-10-CM | POA: Diagnosis not present

## 2024-06-26 DIAGNOSIS — R262 Difficulty in walking, not elsewhere classified: Secondary | ICD-10-CM

## 2024-06-26 MED ORDER — TIZANIDINE HCL 4 MG PO TABS
4.0000 mg | ORAL_TABLET | Freq: Two times a day (BID) | ORAL | 0 refills | Status: DC | PRN
Start: 2024-06-26 — End: 2024-07-21
  Filled 2024-06-26: qty 30, 15d supply, fill #0

## 2024-06-26 NOTE — Therapy (Signed)
 OUTPATIENT PHYSICAL THERAPY NOTE RENEWAL and DISCHARGE   Patient Name: Shaun [name] MRN: [id number] DOB:1956-05-20, 68 y.o., male Today's Date: 06/26/2024  END OF SESSION:  PT End of Session - 06/26/24 1105     Visit Number 4    Number of Visits 8    Date for Recertification  06/26/24    Authorization Type UHC MCR    PT Start Time 1103    PT Stop Time 1145    PT Time Calculation (min) 42 min    Activity Tolerance Patient tolerated treatment well            Past Medical History:  Diagnosis Date   A-fib (HCC)    Anginal pain    Anxiety    Bacterial URI 08/29/2022   CAD (coronary artery disease)    DES to distal circumflex 2016   Cataract    CHF (congestive heart failure) (HCC)    Colon polyps    30 colon polyps found on first colonoscopy   Complication of anesthesia    COPD (chronic obstructive pulmonary disease) (HCC)    Depression    Diastolic heart failure (HCC)    Diverticulitis    DJD (degenerative joint disease)    Dyspnea    Dysrhythmia    A-Fib   Genetic testing 09/09/2014   Negative genetic testing on the ColoNext panel test and the MSH2 inversion testing.  The ColoNext gene panel offered by [facility] and includes sequencing and rearrangement analysis for the following 17 genes: APC, BMPR1A, CDH1, CHEK2, EPCAM, GREM1, MLH1, MSH2, MSH6, MUTYH, PMS2, POLD1, POLE, PTEN, SMAD4, STK11, and TP53.   The report date is 09/08/14.      GERD (gastroesophageal reflux disease)    History of kidney stones    Hyperlipidemia    Hypernatremia 08/22/2021   Hypertension    Insomnia    Obstructive sleep apnea 12/2009   01/26/2010 AHI 83/hr   Permanent atrial fibrillation (HCC)    Onset 2006 paroxysmal then progressive to persistent   Pneumonia    PONV (postoperative nausea and vomiting)    PUD (peptic ulcer disease)    1980s   RLS (restless legs syndrome)    Sinusitis    Skin cancer    Tremor    Type 2 diabetes mellitus (HCC)    Past Surgical  History:  Procedure Laterality Date   BIOPSY  07/17/2018   Procedure: BIOPSY;  Surgeon: Harvey Margo CROME, MD;  [facility];  Service: Endoscopy;;  colon   BOWEL RESECTION  09/17/2018   SMALL BOWEL RESECTION: 71 CM    CARDIAC CATHETERIZATION N/A 07/21/2015   Procedure: Left Heart Cath and Coronary Angiography;  Surgeon: Peter M Jordan, MD;  Location: [facility] INVASIVE CV LAB;  Service: Cardiovascular;  Laterality: N/A;   CARDIAC CATHETERIZATION N/A 07/21/2015   Procedure: Coronary Stent Intervention;  Surgeon: Peter M Jordan, MD;  Location: [facility] INVASIVE CV LAB;  Service: Cardiovascular;  Laterality: N/A;   CIRCUMCISION N/A 04/05/2019   Procedure: CIRCUMCISION ADULT;  Surgeon: Watt Rush, MD;  Location: [facility];  Service: Urology;  Laterality: N/A;   COLONOSCOPY N/A 05/19/2014   Dr. Sharla diverticulosis/moderate external hemorrhoids, >20 simple adenomas. Genetic screening negative.    COLONOSCOPY WITH PROPOFOL  N/A 07/17/2018   Dr. harvey: Diverticulosis, external/internal hemorrhoids, 32 colon polyps removed.  ten tubular adenomas removed with no high-grade dysplasia.  Advised to have surveillance colonoscopy in 3 years.   COLONOSCOPY WITH PROPOFOL  N/A 06/07/2021   Procedure: COLONOSCOPY WITH PROPOFOL ;  Surgeon: Cindie Carlin POUR, DO;  [facility]: [facility];  Service: Endoscopy;  Laterality: N/A;  9:30 / ASA 3  (Pt was told that his time will be given at Pre-op)   CORONARY PRESSURE/FFR STUDY Left 06/08/2017   Procedure: INTRAVASCULAR PRESSURE WIRE/FFR STUDY;  Surgeon: Mady Bruckner, MD;  Location: [facility] INVASIVE CV LAB;  Service: Cardiovascular;  Laterality: Left;  LAD and CFX   ESOPHAGOGASTRODUODENOSCOPY (EGD) WITH PROPOFOL  N/A 07/17/2018   Dr. harvey: Low-grade narrowing Schatzki ring at the GE junction status post dilation.  Gastritis.  Biopsy with mild nonspecific reactive gastropathy.  No H. pylori.   GIVENS CAPSULE STUDY N/A 06/24/2019   normal   HERNIA REPAIR  1986   Left  inguinal   INCISIONAL HERNIA REPAIR N/A 10/19/2022   Procedure: OPEN INCISIONAL HERNIA REPAIR;  Surgeon: Dasie Leonor CROME, MD;  Location: [facility] OR;  Service: General;  Laterality: N/A;   INSERTION OF MESH N/A 10/19/2022   Procedure: INSERTION OF MESH;  Surgeon: Dasie Leonor CROME, MD;  Location: [facility] OR;  Service: General;  Laterality: N/A;   LAPAROTOMY N/A 09/17/2018   Procedure: EXPLORATORY LAPAROTOMY;  Surgeon: Kallie Manuelita BROCKS, MD;  Location: [facility];  Service: General;  Laterality: N/A;   LEFT HEART CATH AND CORONARY ANGIOGRAPHY N/A 06/08/2017   Procedure: LEFT HEART CATH AND CORONARY ANGIOGRAPHY;  Surgeon: Mady Bruckner, MD;  Location: [facility] INVASIVE CV LAB;  Service: Cardiovascular;  Laterality: N/A;   POLYPECTOMY  07/17/2018   Procedure: POLYPECTOMY;  Surgeon: Harvey Margo CROME, MD;  [facility];  Service: Endoscopy;;  colon   POLYPECTOMY  06/07/2021   Procedure: POLYPECTOMY INTESTINAL;  Surgeon: Cindie Carlin POUR, DO;  [facility];  Service: Endoscopy;;   RIGHT/LEFT HEART CATH AND CORONARY ANGIOGRAPHY N/A 09/19/2023   Procedure: RIGHT/LEFT HEART CATH AND CORONARY ANGIOGRAPHY;  Surgeon: Verlin Bruckner BIRCH, MD;  Location: [facility] INVASIVE CV LAB;  Service: Cardiovascular;  Laterality: N/A;   ROTATOR CUFF REPAIR     Right   SAVORY DILATION N/A 07/17/2018   Procedure: SAVORY DILATION;  Surgeon: Harvey Margo CROME, MD;  [facility]: [facility];  Service: Endoscopy;  Laterality: N/A;   TOTAL HIP ARTHROPLASTY Right 04/19/2024   Procedure: ARTHROPLASTY, HIP, TOTAL, ANTERIOR APPROACH;  Surgeon: Vernetta Bruckner GRADE, MD;  Location: [facility];  Service: Orthopedics;  Laterality: Right;  Needs RNFA   Patient Active Problem List   Diagnosis Date Noted   Chronic pain syndrome 05/10/2024   S/P total right hip arthroplasty 04/19/2024   Status post total replacement of right hip 04/19/2024   Vitamin D  deficiency 12/05/2023   Smoking trying to quit 12/05/2023   Constipation 09/12/2023    GERD (gastroesophageal reflux disease)    Tremor 11/09/2022   SBO (small bowel obstruction) (HCC) 10/29/2022   Thyroid  dysfunction 05/31/2022   Current moderate episode of major depressive disorder without prior episode (HCC) 04/19/2022   Debility 10/21/2021   Sundowning 08/23/2021   Dysphagia 08/22/2021   Obesity (BMI 30-39.9) 08/21/2021   Ventral incisional hernia without obstruction or gangrene 12/31/2020   Dyslipidemia, goal LDL below 70 06/18/2019   Anemia 10/17/2018   Phimosis of penis 09/27/2018   PUD (peptic ulcer disease) 05/21/2018   RLS (restless legs syndrome)    Insomnia    Chronic diastolic CHF (congestive heart failure) (HCC)    Asthma    Hypercoagulable state 01/27/2017   COPD (chronic obstructive pulmonary disease) (HCC) 07/20/2015   Coronary artery disease of native artery of native heart with stable  angina pectoris 07/19/2015   Encounter for annual physical exam 09/09/2014   Hx of adenomatous colonic polyps 08/11/2014   Type 2 diabetes mellitus with hyperglycemia, without long-term current use of insulin  (HCC) 02/09/2011   CAD S/P percutaneous coronary angioplasty    Hypertension associated with chronic kidney disease due to type 2 diabetes mellitus (HCC)    Chronic atrial fibrillation (HCC) 02/23/2010   Obstructive sleep apnea 12/27/2009    PCP: Oris Credit NP   REFERRING PROVIDER: Vernetta Bruckner MD   REFERRING DIAG: [id number] (ICD-10-CM) - Status post total replacement of right hip  THERAPY DIAG:  Status post right hip replacement  Difficulty in walking, not elsewhere classified  Rationale for Evaluation and Treatment: Rehabilitation  ONSET DATE: 04/19/24  SUBJECTIVE:   SUBJECTIVE STATEMENT:  Patient was sick last week, missed one visit.  His hip is doing well, min pain .  Some knee pain with the weather.  He is done with the walker and keeps the cane for just in case.  I'm so tickled its feeling better.   Patient presents to PT s/p Rt THA  which has been improving since the surgery.  He is here to just be sure he is doing everything right and to be sure he is on track.  He reports very minimal to no pain in the past week.  He reports min difficulty with the following activities: walking, standing, home tasks and work activities.   Operated leg has swelling since the surgery, but he notices an improvement. He uses the walker in the house for the most part, but has tried walking with the cane and even some without any device. He reports he has not been following any hip precautions per se but he is cautious and making good decisions.    PERTINENT HISTORY   A-fib   PAIN:  Are you having pain? Yes: NPRS scale: no hip pain at all  Pain location: Rt lateral hip and thigh  Pain description: discomfort Aggravating factors: not having much  Relieving factors: resting, takes Oxy   PRECAUTIONS: Anterior hip   RED FLAGS: Would like None   WEIGHT BEARING RESTRICTIONS: No  FALLS:  Has patient fallen in last 6 months? No  LIVING ENVIRONMENT: Lives with: lives with their family Lives in: House/apartment Stairs: Yes: External: 1 steps; on right going up Has following equipment at home: Single point cane and Walker - 2 wheeled  OCCUPATION: not working   PLOF: Independent with basic ADLs, Independent with household mobility with device, Independent with community mobility with device, and Leisure: Likes to work in the yard and around the house, play with his grandkids  PATIENT GOALS: I want to be able to climb a ladder work in the yard, baseball   NEXT MD VISIT: Follow up 2-3 weeks   OBJECTIVE:  Note: Objective measures were completed at Evaluation unless otherwise noted.  DIAGNOSTIC FINDINGS: none  PATIENT SURVEYS:    EVAL: LEFS   Score total:  33/80    06/26/24  COGNITION: Overall cognitive status: Within functional limits for tasks assessed     SENSATION: WFL  EDEMA:  R 52 cm, L 49 cm   POSTURE: Rt  pelvis retracted in standing, mild forward head and kyphosis in thoracic spine loss of lumbar lordosis  PALPATION: TTP lateral hip and thigh surrounding the incision  LOWER EXTREMITY ROM:  Passive ROM Right eval Left eval  Hip flexion 100   Hip extension    Hip abduction    Hip  adduction    Hip internal rotation 20 30  Hip external rotation 25 45  Knee flexion    Knee extension    Ankle dorsiflexion    Ankle plantarflexion    Ankle inversion    Ankle eversion     (Blank rows = not tested)  LOWER EXTREMITY MMT:  MMT Right eval Left eval  Hip flexion 4 5  Hip extension    Hip abduction    Hip adduction    Hip internal rotation    Hip external rotation    Knee flexion 5 5  Knee extension 5 5  Ankle dorsiflexion    Ankle plantarflexion    Ankle inversion    Ankle eversion     (Blank rows = not tested)    FUNCTIONAL TESTS:  5 times sit to stand: 18 2 minute walk test: NT   Step ups, tandem and hip abduction in standing   Heel raises x 10   06/26/24:  5 x STS 13.9 sec    GAIT: Distance walked: 150 feet with mod I  Assistive device utilized: Single point cane Level of assistance: SBA Comments: mod I with RW                                                                                                                                 TREATMENT DATE:    [facility] Adult PT Treatment:                                                DATE: 06/26/24 Therapeutic Exercise: Standing hip flexor stretch on step  Step up no UEs  Hip abduction x 15   LTR  Bridge x  10  Hamstring 1 min each LE  Piriformis stretch 30 sec x 3   Therapeutic Activity: Tandem balance 30 sec  SLS max time  Ambulation with various tasks: sidestepping, retrostepping Head turns, nods Sit to stand  Heel raises   [facility] Adult PT Treatment:                                                DATE: 06/10/24 Therapeutic Exercise: Recumbent bike 5 min L1  Hip abduction, extension standing x 20   Sit to stand x 15 (10 lbs)  Standing core 10 lb carry with march  High march weight at chest, then suitcase x 30 sec  SLR hip flex, abduction Stretch hip flexor  Knee to chest x 3  Banded bridge  x 10 added clam  LTR wide feet  Hamstring stretch 60 sec  PROM Rt hip Rt LE  Sidelying hip abduction and clam x 15  Therapeutic Activity: DGI 24/24  PATIENT EDUCATION:  Education details: see above  Person educated: Patient Education method: Explanation, Demonstration, and Verbal cues Education comprehension: verbalized understanding and needs further education  HOME EXERCISE PROGRAM: Access Code: [email or web address] URL: [email or web address] Date: 06/05/2024 Prepared by: Delon Norma  Exercises - Sit to Stand Without Arm Support  - 1 x daily - 7 x weekly - 2 sets - 10 reps - Hooklying Single Knee to Chest Stretch  - 1 x daily - 7 x weekly - 1 sets - 5 reps - 30 hold - Supine Piriformis Stretch with Foot on Ground  - 1 x daily - 7 x weekly - 1 sets - 5 reps - 30 hold - Supine Bridge  - 1 x daily - 7 x weekly - 2 sets - 10 reps - 5 hold - Clamshell  - 1 x daily - 7 x weekly - 2 sets - 10 reps - 5 hold - Standing Hip Flexor Stretch  - 1 x daily - 7 x weekly - 1 sets - 5 reps - 30 hold  ASSESSMENT:  CLINICAL IMPRESSION: Patient has met his goals and is pleased with his progress and current mobility.  He needs only min cues for reducing limp but he does not notice it mch when compared to pre-surgical.    OBJECTIVE IMPAIRMENTS: decreased coordination, decreased endurance, decreased knowledge of use of DME, decreased mobility, difficulty walking, decreased ROM, decreased strength, increased edema, impaired flexibility, impaired sensation, improper body mechanics, postural dysfunction, obesity, and pain.   ACTIVITY LIMITATIONS: lifting, bending, standing, squatting, sleeping, transfers, and locomotion level  PARTICIPATION LIMITATIONS: driving, community activity, and yard  work  PERSONAL FACTORS: 1-2 comorbidities: atrial fib, CHF/COPD are also affecting patient's functional outcome.   REHAB POTENTIAL: Excellent  CLINICAL DECISION MAKING: Stable/uncomplicated  EVALUATION COMPLEXITY: Low   GOALS: Goals reviewed with patient? Yes   SHORT TERM GOAL= LONG TERM GOALS: Target date: 06/11/2024  Patient will be independent with final HEP upon discharge from PT and report consistent benefit following exercise completion.    Baseline:  Goal status: MET   2.  Patient will be able to ambulate with cane in the community over 300 feet safely with no loss of balance, modified independent Baseline:  Goal status: MET   3.  Patient will demonstrate 4+/5 hip extension and abduction strength in order to maximize stability with gait and stair negotiation Baseline:  Goal status: MET   4.  Patient will be able to perform single-leg balance on operated leg static for 15 sec or more  Baseline:  Goal status:MET  5.  Patient will be able to cross his right leg over the left in order to complete lower body dressing Baseline:  Goal status: MET, tightness     PLAN:  PT FREQUENCY: 1-2x/week  PT DURATION: 4 weeks  PLANNED INTERVENTIONS: 97164- PT Re-evaluation, 97750- Physical Performance Testing, 97110-Therapeutic exercises, 97530- Therapeutic activity, 97112- Neuromuscular re-education, 97535- Self Care, 02859- Manual therapy, [phone]- Gait training, Patient/Family education, Balance training, Stair training, DME instructions, Cryotherapy, and Moist heat  PLAN FOR NEXT SESSION: NA, DC    Sharnay Cashion, PT 06/26/2024, 11:05 AM   Delon Norma, PT 06/26/24 11:05 AM Phone: [phone] Fax: [phone]

## 2024-06-26 NOTE — Addendum Note (Signed)
 Addended by: JOHNSTON NEST L on: 06/26/2024 11:54 AM   Modules accepted: Orders

## 2024-06-27 ENCOUNTER — Other Ambulatory Visit (HOSPITAL_BASED_OUTPATIENT_CLINIC_OR_DEPARTMENT_OTHER): Payer: Self-pay

## 2024-07-01 ENCOUNTER — Encounter: Payer: Self-pay | Admitting: Radiology

## 2024-07-09 ENCOUNTER — Other Ambulatory Visit: Payer: Self-pay | Admitting: Nurse Practitioner

## 2024-07-09 DIAGNOSIS — M2550 Pain in unspecified joint: Secondary | ICD-10-CM

## 2024-07-09 DIAGNOSIS — G894 Chronic pain syndrome: Secondary | ICD-10-CM

## 2024-07-09 MED ORDER — HYDROCODONE-ACETAMINOPHEN 5-325 MG PO TABS
1.0000 | ORAL_TABLET | Freq: Two times a day (BID) | ORAL | 0 refills | Status: DC | PRN
  Filled 2024-07-09: qty 120, 30d supply, fill #0

## 2024-07-09 NOTE — Telephone Encounter (Signed)
 Last appt. 05/10/24 last filled 05/10/24

## 2024-07-10 ENCOUNTER — Other Ambulatory Visit (HOSPITAL_BASED_OUTPATIENT_CLINIC_OR_DEPARTMENT_OTHER): Payer: Self-pay

## 2024-07-10 ENCOUNTER — Other Ambulatory Visit: Payer: Self-pay

## 2024-07-11 ENCOUNTER — Telehealth: Payer: Self-pay

## 2024-07-11 ENCOUNTER — Other Ambulatory Visit: Payer: Self-pay

## 2024-07-11 DIAGNOSIS — E1165 Type 2 diabetes mellitus with hyperglycemia: Secondary | ICD-10-CM

## 2024-07-11 NOTE — Progress Notes (Signed)
 07/11/2024 Name: Shaun [name] MRN: [id number] DOB: November 04, 1955  Chief Complaint  Patient presents with   Medication Management   Diabetes   Medication Assistance    Shaun [name] is a 68 y.o. year old male who presented for a telephone visit.   They were referred to the pharmacist by their PCP for assistance in managing diabetes and medication access.    Subjective:  Care Team: Primary Care Provider: Oris Camie BRAVO, NP ; Next Scheduled Visit: 12/24/24  Medication Access/Adherence  Current Pharmacy:  [facility] [address] [place] [address] Phone: [phone] Fax: [phone]   Patient reports affordability concerns with their medications: No Patient reports access/transportation concerns to their pharmacy: No  Patient reports adherence concerns with their medications:  No     Diabetes:  Current medications: Jardiance  10mg , Metformin  500mg  BID Medications tried in the past: Glipizide   Current glucose readings:  Checks once to twice daily, usually post-prandial in the evening Seeing readings from 90-145 fasting, normally below 130  Observed patterns:  Patient denies hypoglycemic s/sx including dizziness, shakiness, sweating. Patient denies hyperglycemic symptoms including polyuria, polydipsia, polyphagia, nocturia, neuropathy, blurred vision.  Current medication access support: Jardiance  through [facility]  Tobacco Abuse:  Tobacco Use History: Number of cigarettes per day: None as of 5 weeks today    Objective:  Lab Results  Component Value Date   HGBA1C 7.5 (H) 03/14/2024    Lab Results  Component Value Date   CREATININE 0.86 04/20/2024   BUN 21 04/20/2024   NA 134 (L) 04/20/2024   K 4.1 04/20/2024   CL 103 04/20/2024   CO2 23 04/20/2024    Lab Results  Component Value Date   CHOL 131 12/07/2023   HDL 30 (L) 12/07/2023   LDLCALC 66 12/07/2023   LDLDIRECT 83.0 04/19/2022    TRIG 213 (H) 12/07/2023   CHOLHDL 4.4 12/07/2023    Medications Reviewed Today     Reviewed by Lionell Jon DEL, RPH (Pharmacist) on 07/11/24 at [phone]  Med List Status: <None>   Medication Order Taking? Sig Documenting Provider Last Dose Status Informant  albuterol  (PROVENTIL ) (2.5 MG/3ML) 0.083% nebulizer solution [id number]  Take 3 mLs (2.5 mg total) by nebulization every 6 (six) hours as needed for wheezing or shortness of breath. Dreama Longs, MD  Active Self, Pharmacy Records           Med Note SOILA, BROOKE C   Tue Dec 12, 2023 10:12 AM)    albuterol  (VENTOLIN  HFA) 108 [id number] Base) MCG/ACT inhaler [id number]  INHALE 2 PUFFS INTO THE LUNGS EVERY 6 HOURS AS NEEDED FOR WHEEZING OR SHORTNESS OF BREATH Tysinger, Alm RAMAN, PA-C  Active Self, Pharmacy Records           Med Note Tamiami, BROOKE C   Tue Dec 12, 2023 10:12 AM)    apixaban  (ELIQUIS ) 5 MG TABS tablet [id number]  Take 1 tablet (5 mg total) by mouth 2 (two) times daily. Pietro Redell RAMAN, MD  Active Self, Pharmacy Records  ascorbic acid  (VITAMIN C ) 500 MG tablet [id number]  Take 500 mg by mouth daily. [provider]  Active Self, Pharmacy Records  atorvastatin  (LIPITOR ) 80 MG tablet [id number]  Take 1 tablet (80 mg total) by mouth every evening. Janene Boer, [place]  Active Self, Pharmacy Records  buPROPion  (WELLBUTRIN  XL) 150 MG 24 hr tablet [id number]  Take 3 tablets (450 mg total) by mouth daily. Early, Sara E, NP  Active Self, Pharmacy  Records  docusate sodium  (COLACE) 100 MG capsule [id number]  Take 1 capsule (100 mg total) by mouth daily as needed for mild constipation. Early, Sara E, NP  Active Self, Pharmacy Records  empagliflozin  (JARDIANCE ) 10 MG TABS tablet [id number]  Take 10 mg by mouth daily. Through [facility] PAP [provider]  Active Self, Pharmacy Records  escitalopram  (LEXAPRO ) 20 MG tablet [id number]  Take 20 mg by mouth in the morning. [provider]  Active Self, Pharmacy Records  ezetimibe  (ZETIA )  10 MG tablet [id number]  Take 1 tablet (10 mg total) by mouth daily. Pietro Redell RAMAN, MD  Active Self, Pharmacy Records  ferrous sulfate  [facility]) 325 (65 FE) MG tablet [id number]  Take 1 tablet (325 mg total) by mouth 3 (three) times a week. Early, Sara E, NP  Active Self, Pharmacy Records  furosemide  (LASIX ) 40 MG tablet [id number]  Take 1 tablet (40mg ) daily. May take 1/2 tablet (20mg ) additional if shortness of breath or swelling occurs. Early, Sara E, NP  Active Self, Pharmacy Records  HYDROcodone -acetaminophen  (NORCO/VICODIN) 5-325 MG tablet [id number]  Take 1-2 tablets by mouth every 12 (twelve) hours as needed for moderate pain (pain score 4-6) or severe pain (pain score 7-10). Early, Sara E, NP  Active   hydrOXYzine  (ATARAX ) 25 MG tablet [id number]  Take 1 tablet (25 mg total) by mouth at bedtime and may repeat dose one time if needed for sleep. Oris Camie BRAVO, NP  Active Self, Pharmacy Records  lidocaine  (XYLOCAINE ) 1 % (with pres) injection 5 mL [id number]   Carilyn Prentice BRAVO, MD  Active   metFORMIN  (GLUCOPHAGE ) 500 MG tablet [id number]  Take 1 tablet (500 mg total) by mouth 2 (two) times daily before a meal. Early, Camie BRAVO, NP  Active Self, Pharmacy Records  methimazole  (TAPAZOLE ) 5 MG tablet [id number]  Take 1 tablet (5 mg total) by mouth as directed. 1 tablet 4 days a week [name], Donell Cardinal, MD  Active Self, Pharmacy Records           Med Note JANEAN, Iren Whipp H   Thu May 09, 2024  9:03 AM)    metoprolol  tartrate (LOPRESSOR ) 100 MG tablet [id number]  Take 1 tablet (100 mg total) by mouth 2 (two) times daily. Janene Boer, [place]  Active Self, Pharmacy Records  nitroGLYCERIN  (NITROSTAT ) 0.4 MG SL tablet [id number]  Place 1 tablet (0.4 mg total) under the tongue every 5 (five) minutes as needed. Early, Sara E, NP  Active Self, Pharmacy Records           Med Note SOILA, [place] C   Tue Dec 12, 2023 10:16 AM)    pantoprazole  (PROTONIX ) 40 MG tablet [id number]  Take 1 tablet (40 mg total) by mouth  daily before breakfast. Ezzard Sonny RAMAN, PA-C  Active Self, Pharmacy Records  polyethylene glycol powder (GLYCOLAX /MIRALAX ) 17 GM/SCOOP powder [id number]  Take one capful (17g) twice daily until soft stool, then continue one capful once daily as needed. Ezzard Sonny RAMAN, PA-C  Active Self, Pharmacy Records  potassium chloride  SA (KLOR-CON  M) 20 MEQ tablet [id number]  Take 1 tablet (20 mEq total) by mouth daily. Pietro Redell RAMAN, MD  Active   rOPINIRole  (REQUIP ) 3 MG tablet [id number]  Take 1 tablet (3 mg total) by mouth at bedtime, AND 0.5 tablets (1.5 mg total) during the day for breakthrough symptoms. Early, Sara E, NP  Active   sildenafil  (VIAGRA ) 100 MG tablet [id number]  Take 1 tablet (100 mg total) by mouth as needed for  erectile dysfunction. Do not take nitroglycerin  within 24 hours of last dose. Early, Sara E, NP  Active Self, Pharmacy Records  Tdap ROCCO) 5-2.5-18.5 LF-MCG/0.5 injection [id number]  Inject into the muscle. Luiz Channel, MD  Active   tiZANidine  (ZANAFLEX ) 4 MG tablet [id number]  Take 1 tablet (4 mg total) by mouth every 12 (twelve) hours as needed for muscle spasms. Only take if pain is not under a 5 with other medications. Early, Sara E, NP  Active   traZODone  (DESYREL ) 150 MG tablet [id number]  Take 1 tablet (150 mg total) by mouth at bedtime. Early, Sara E, NP  Active   Vitamin D , Ergocalciferol , (DRISDOL ) 1.25 MG (50000 UNIT) CAPS capsule [id number]  Take 1 capsule (50,000 Units total) by mouth every 7 (seven) days. Oris Camie BRAVO, NP  Active Self, Pharmacy Records              Assessment/Plan:   Diabetes: - Currently uncontrolled per last A1C but reporting controlled readings at home - Reviewed long term cardiovascular and renal outcomes of uncontrolled blood sugar - Reviewed goal A1c, goal fasting, and goal 2 hour post prandial glucose - Reviewed dietary modifications including low carb diet -Recommend to continue current medication therapy. Consider increasing  metformin  if A1C still above 7 at next check - Recommend to check glucose daily -Coordinated Jardiance  2026 PAP renewal through [facility], paperwork prepared, patient prefers to come by office to sign at his convenience next week     Tobacco Abuse - Currently controlled -Congratulated patient on continued success in having stopped smoking, encouraged to reach back out if he begins to feel tempted to restart    Follow Up Plan: 6 months  Jon VEAR [place], PharmD Clinical Pharmacist [phone]

## 2024-07-11 NOTE — Telephone Encounter (Signed)
 Filled and faxed pt and provider portion to [facility] pt is coming to office to sign Bicares (Jardiance ) pap.

## 2024-07-21 ENCOUNTER — Other Ambulatory Visit: Payer: Self-pay | Admitting: Nurse Practitioner

## 2024-07-21 DIAGNOSIS — M2550 Pain in unspecified joint: Secondary | ICD-10-CM

## 2024-07-22 ENCOUNTER — Other Ambulatory Visit (HOSPITAL_BASED_OUTPATIENT_CLINIC_OR_DEPARTMENT_OTHER): Payer: Self-pay

## 2024-07-22 ENCOUNTER — Other Ambulatory Visit: Payer: Self-pay

## 2024-07-22 ENCOUNTER — Other Ambulatory Visit (HOSPITAL_BASED_OUTPATIENT_CLINIC_OR_DEPARTMENT_OTHER): Payer: Self-pay | Admitting: Nurse Practitioner

## 2024-07-22 ENCOUNTER — Other Ambulatory Visit: Payer: Self-pay | Admitting: Nurse Practitioner

## 2024-07-22 DIAGNOSIS — E611 Iron deficiency: Secondary | ICD-10-CM

## 2024-07-22 DIAGNOSIS — Z01810 Encounter for preprocedural cardiovascular examination: Secondary | ICD-10-CM

## 2024-07-22 DIAGNOSIS — E785 Hyperlipidemia, unspecified: Secondary | ICD-10-CM

## 2024-07-22 DIAGNOSIS — I5032 Chronic diastolic (congestive) heart failure: Secondary | ICD-10-CM

## 2024-07-22 MED ORDER — FERROUS SULFATE 325 (65 FE) MG PO TABS
325.0000 mg | ORAL_TABLET | ORAL | 1 refills | Status: AC
  Filled 2024-07-22: qty 30, 70d supply, fill #0

## 2024-07-22 MED ORDER — FUROSEMIDE 40 MG PO TABS
ORAL_TABLET | ORAL | 1 refills | Status: AC
  Filled 2024-07-22: qty 120, 80d supply, fill #0

## 2024-07-22 MED ORDER — TIZANIDINE HCL 4 MG PO TABS
4.0000 mg | ORAL_TABLET | Freq: Two times a day (BID) | ORAL | 0 refills | Status: DC | PRN
  Filled 2024-07-22: qty 30, 15d supply, fill #0

## 2024-07-22 NOTE — Telephone Encounter (Signed)
 Last appt. 05/10/24

## 2024-07-31 ENCOUNTER — Telehealth: Payer: Self-pay | Admitting: Neurology

## 2024-07-31 LAB — OPHTHALMOLOGY REPORT-SCANNED

## 2024-07-31 NOTE — Progress Notes (Unsigned)
 Assessment/Plan:    Tremor -likely mostly longstanding ET.  Does have a rest component now.  This is likely from longstanding essential tremor, but given gait instability and family history of Parkinson's, we decided to proceed with DaTscan. - Now that he is off of the Wellbutrin , we can get the patient scheduled for DaTscan.  He has actually been off of it for about 6 to 8 weeks, and both he and I were surprised that tremor was fairly significantly better when examined today.             - Hyperthyroidism may be contributing, but generally numbers look good and well-controlled with methimazole .             - Discussed with patient that his options for oral meds really are quite limited.  He is already on a beta-blocker (metoprolol ).  The any other first-line drug is primidone, and that interacts with the Eliquis , so he cannot be on that.  Topamax is considered first-line medication, but only in high dosages, which are often not tolerated in this age group.  I don't think that gabapentin will likely make a difference with this degree of tremor.             - Discussed surgical options in detail, including focused ultrasound and DBS.  He was shown HIPAA compliant videos of pt with ET who had DBS.  He is not sure if he would like to proceed with that, but he is going to think about that.  He and I talked about the fact that he would need to stop the tobacco prior to any type of DBS surgery.  He is also on Eliquis , which is a complicating factor but not contraindicated.             - Discussed Cala Trio and information given on that.             -decreasing caffeine  may be of value   2.  Tobacco abuse             - Discussed the importance of tobacco cessation.   3.  Mood d/o             - Patient weaned Wellbutrin  slowly for DaT scan, but has actually been off it for 6 to 8 weeks.  Mood has been pretty good off of the medication and very rarely he will feel somewhat depressed.  I did tell him he  may be able to get back on the medication at a lower dose, or find a different medication, especially since we think that that may have contributed to his tremor.  He was told to follow-up with his nurse practitioner following the DaTscan so he can get back on the medication.   4.  Hip pain             - Patient is status post right hip arthroplasty and is doing much better  Subjective:   Shaun [name] was seen today in follow up for essential tremor.  My previous records were reviewed prior to todays visit.  I saw the patient in August.  I thought he probably had longstanding essential tremor that now had a rest component because of the degree of essential tremor that he had.  However, I wanted to do a DaTscan.  He was on high dose Wellbutrin  at the time, so it could not be stopped for a week without having consequences.  I ended up reaching  out to his nurse practitioner for coordination of care.  She was helpful and stated that it was going to take some time to get him down off of the medication for the scan.  She saw the patient and told the patient to contact me once he was off of the medication so that we could order the scan.  Unfortunately, I never got a callback from the patient, so the scan was not ordered.  He did get his R hip replaced and he is so happy that ; I cannot believe I waited so long to have that done.   ALLERGIES:  No Known Allergies  CURRENT MEDICATIONS:  Current Meds  Medication Sig   albuterol  (PROVENTIL ) (2.5 MG/3ML) 0.083% nebulizer solution Take 3 mLs (2.5 mg total) by nebulization every 6 (six) hours as needed for wheezing or shortness of breath.   albuterol  (VENTOLIN  HFA) 108 (90 Base) MCG/ACT inhaler INHALE 2 PUFFS INTO THE LUNGS EVERY 6 HOURS AS NEEDED FOR WHEEZING OR SHORTNESS OF BREATH   apixaban  (ELIQUIS ) 5 MG TABS tablet Take 1 tablet (5 mg total) by mouth 2 (two) times daily.   ascorbic acid  (VITAMIN C ) 500 MG tablet Take 500 mg by mouth daily.    atorvastatin  (LIPITOR ) 80 MG tablet Take 1 tablet (80 mg total) by mouth every evening.   buPROPion  (WELLBUTRIN  XL) 150 MG 24 hr tablet Take 3 tablets (450 mg total) by mouth daily.   docusate sodium  (COLACE) 100 MG capsule Take 1 capsule (100 mg total) by mouth daily as needed for mild constipation.   empagliflozin  (JARDIANCE ) 10 MG TABS tablet Take 10 mg by mouth daily. Through [employer] PAP   escitalopram  (LEXAPRO ) 20 MG tablet Take 20 mg by mouth in the morning.   ezetimibe  (ZETIA ) 10 MG tablet Take 1 tablet (10 mg total) by mouth daily.   ferrous sulfate  (FEROSUL) 325 (65 FE) MG tablet Take 1 tablet (325 mg total) by mouth 3 (three) times a week.   furosemide  (LASIX ) 40 MG tablet Take 1 tablet (40 mg total) by mouth daily. May also take additional ONE-HALF tablet (20 mg total) as needed if shortness of breath or swelling occurs.   HYDROcodone -acetaminophen  (NORCO/VICODIN) 5-325 MG tablet Take 1-2 tablets by mouth every 12 (twelve) hours as needed for moderate pain (pain score 4-6) or severe pain (pain score 7-10).   hydrOXYzine  (ATARAX ) 25 MG tablet Take 1 tablet (25 mg total) by mouth at bedtime and may repeat dose one time if needed for sleep.   metFORMIN  (GLUCOPHAGE ) 500 MG tablet Take 1 tablet (500 mg total) by mouth 2 (two) times daily before a meal.   methimazole  (TAPAZOLE ) 5 MG tablet Take 1 tablet (5 mg total) by mouth as directed. 1 tablet 4 days a week   metoprolol  tartrate (LOPRESSOR ) 100 MG tablet Take 1 tablet (100 mg total) by mouth 2 (two) times daily.   nitroGLYCERIN  (NITROSTAT ) 0.4 MG SL tablet Place 1 tablet (0.4 mg total) under the tongue every 5 (five) minutes as needed.   pantoprazole  (PROTONIX ) 40 MG tablet Take 1 tablet (40 mg total) by mouth daily before breakfast.   polyethylene glycol powder (GLYCOLAX /MIRALAX ) 17 GM/SCOOP powder Take one capful (17g) twice daily until soft stool, then continue one capful once daily as needed.   potassium chloride  SA (KLOR-CON  M) 20 MEQ  tablet Take 1 tablet (20 mEq total) by mouth daily.   rOPINIRole  (REQUIP ) 3 MG tablet Take 1 tablet (3 mg total) by mouth at bedtime,  AND 0.5 tablets (1.5 mg total) during the day for breakthrough symptoms.   sildenafil  (VIAGRA ) 100 MG tablet Take 1 tablet (100 mg total) by mouth as needed for erectile dysfunction. Do not take nitroglycerin  within 24 hours of last dose.   Tdap (BOOSTRIX ) 5-2.5-18.5 LF-MCG/0.5 injection Inject into the muscle.   tiZANidine  (ZANAFLEX ) 4 MG tablet Take 1 tablet (4 mg total) by mouth every 12 (twelve) hours as needed for muscle spasms. Only take if pain is not under a 5 with other medications.   traZODone  (DESYREL ) 150 MG tablet Take 1 tablet (150 mg total) by mouth at bedtime.   Vitamin D , Ergocalciferol , (DRISDOL ) 1.25 MG (50000 UNIT) CAPS capsule Take 1 capsule (50,000 Units total) by mouth every 7 (seven) days.   Current Facility-Administered Medications for the 08/02/24 encounter (Office Visit) with Chrissy Ealey, Asberry RAMAN, DO  Medication   lidocaine  (XYLOCAINE ) 1 % (with pres) injection 5 mL      Objective:    PHYSICAL EXAMINATION:    VITALS:   Vitals:   08/02/24 1111  BP: (!) 154/88  Pulse: 97  SpO2: 97%  Weight: 215 lb (97.5 kg)  Height: 5' 8 (1.727 m)    GEN:  The patient appears stated age and is in NAD. HEENT:  Normocephalic, atraumatic.  The mucous membranes are moist. The superficial temporal arteries are without ropiness or tenderness. CV:  RRR Lungs:  CTAB Neck/HEME:  There are no carotid bruits bilaterally.  Neurological examination:  Orientation: The patient is alert and oriented x3. Cranial nerves: There is good facial symmetry. The speech is fluent and clear. Soft palate rises symmetrically and there is no tongue deviation. Hearing is intact to conversational tone. Sensation: Sensation is intact to light touch throughout Motor: Strength is at least antigravity x4.  Movement examination: Tone: There is ?min increased tone in the right  upper extremity Abnormal movements: There is no rest tremor today.  There is mild postural tremor.  There is mild to moderate intention tremor.  He has trouble with Archimedes spirals bilaterally, but they are much better than last visit. Coordination:  There is no decremation with RAM's, with any form of RAMS, including alternating supination and pronation of the forearm, hand opening and closing, finger taps, heel taps and toe taps.  Gait and Station: The patient ambulates well in the hall today post right hip replacement I have reviewed and interpreted the following labs independently   Chemistry      Component Value Date/Time   NA 134 (L) 04/20/2024 0333   NA 135 12/07/2023 0821   K 4.1 04/20/2024 0333   CL 103 04/20/2024 0333   CO2 23 04/20/2024 0333   BUN 21 04/20/2024 0333   BUN 13 12/07/2023 0821   CREATININE 0.86 04/20/2024 0333   CREATININE 0.92 12/15/2017 0744      Component Value Date/Time   CALCIUM  8.2 (L) 04/20/2024 0333   ALKPHOS 85 12/07/2023 0821   AST 19 12/07/2023 0821   ALT 24 12/07/2023 0821   BILITOT 0.5 12/07/2023 0821      Lab Results  Component Value Date   WBC 14.9 (H) 04/20/2024   HGB 13.7 04/20/2024   HCT 43.1 04/20/2024   MCV 86.5 04/20/2024   PLT 141 (L) 04/20/2024   Lab Results  Component Value Date   TSH 0.48 03/25/2024     Chemistry      Component Value Date/Time   NA 134 (L) 04/20/2024 0333   NA 135 12/07/2023 9178  K 4.1 04/20/2024 0333   CL 103 04/20/2024 0333   CO2 23 04/20/2024 0333   BUN 21 04/20/2024 0333   BUN 13 12/07/2023 0821   CREATININE 0.86 04/20/2024 0333   CREATININE 0.92 12/15/2017 0744      Component Value Date/Time   CALCIUM  8.2 (L) 04/20/2024 0333   ALKPHOS 85 12/07/2023 0821   AST 19 12/07/2023 0821   ALT 24 12/07/2023 0821   BILITOT 0.5 12/07/2023 9178       Cc:  Early, Sara E, NP

## 2024-07-31 NOTE — Telephone Encounter (Signed)
 Pt is returning a call, and pt stated he could not understand the message lefted on her voice mail. Please call. Thanks

## 2024-08-02 ENCOUNTER — Ambulatory Visit: Admitting: Neurology

## 2024-08-02 ENCOUNTER — Encounter: Payer: Self-pay | Admitting: Neurology

## 2024-08-02 VITALS — BP 154/88 | HR 97 | Ht 68.0 in | Wt 215.0 lb

## 2024-08-02 DIAGNOSIS — F39 Unspecified mood [affective] disorder: Secondary | ICD-10-CM

## 2024-08-02 DIAGNOSIS — R251 Tremor, unspecified: Secondary | ICD-10-CM

## 2024-08-12 ENCOUNTER — Other Ambulatory Visit: Payer: Self-pay

## 2024-08-12 ENCOUNTER — Other Ambulatory Visit (HOSPITAL_BASED_OUTPATIENT_CLINIC_OR_DEPARTMENT_OTHER): Payer: Self-pay

## 2024-08-12 ENCOUNTER — Other Ambulatory Visit: Payer: Self-pay | Admitting: Nurse Practitioner

## 2024-08-12 DIAGNOSIS — E119 Type 2 diabetes mellitus without complications: Secondary | ICD-10-CM

## 2024-08-12 MED ORDER — METFORMIN HCL 500 MG PO TABS
500.0000 mg | ORAL_TABLET | Freq: Two times a day (BID) | ORAL | 1 refills | Status: AC
  Filled 2024-08-15: qty 180, 90d supply, fill #0

## 2024-08-15 ENCOUNTER — Other Ambulatory Visit (HOSPITAL_BASED_OUTPATIENT_CLINIC_OR_DEPARTMENT_OTHER): Payer: Self-pay

## 2024-08-15 ENCOUNTER — Telehealth: Payer: Self-pay

## 2024-08-15 NOTE — Progress Notes (Signed)
° °  08/15/2024  Patient ID: Shaun [name], male   DOB: 1956/06/25, 68 y.o.   MRN: [id number]  Submitted completed application to [facility] [employer] for [employer]  PAP renewal for 2026, pending company decision.  Shaun [name], PharmD Clinical Pharmacist [phone]

## 2024-08-17 ENCOUNTER — Other Ambulatory Visit: Payer: Self-pay | Admitting: Nurse Practitioner

## 2024-08-17 DIAGNOSIS — G2581 Restless legs syndrome: Secondary | ICD-10-CM

## 2024-08-19 ENCOUNTER — Other Ambulatory Visit (HOSPITAL_BASED_OUTPATIENT_CLINIC_OR_DEPARTMENT_OTHER): Payer: Self-pay

## 2024-08-19 MED ORDER — ROPINIROLE HCL 3 MG PO TABS
ORAL_TABLET | ORAL | 1 refills | Status: AC
  Filled 2024-08-19: qty 45, 30d supply, fill #0
  Filled 2024-09-06 – 2024-09-17 (×2): qty 45, 30d supply, fill #1

## 2024-08-21 ENCOUNTER — Encounter: Payer: Self-pay | Admitting: Internal Medicine

## 2024-08-23 ENCOUNTER — Other Ambulatory Visit (HOSPITAL_BASED_OUTPATIENT_CLINIC_OR_DEPARTMENT_OTHER): Payer: Self-pay

## 2024-08-23 ENCOUNTER — Other Ambulatory Visit (HOSPITAL_COMMUNITY): Payer: Self-pay

## 2024-08-26 ENCOUNTER — Other Ambulatory Visit (HOSPITAL_BASED_OUTPATIENT_CLINIC_OR_DEPARTMENT_OTHER): Payer: Self-pay

## 2024-08-26 ENCOUNTER — Other Ambulatory Visit: Payer: Self-pay | Admitting: Nurse Practitioner

## 2024-08-26 DIAGNOSIS — M2550 Pain in unspecified joint: Secondary | ICD-10-CM

## 2024-08-26 MED ORDER — TIZANIDINE HCL 4 MG PO TABS
4.0000 mg | ORAL_TABLET | Freq: Two times a day (BID) | ORAL | 0 refills | Status: DC | PRN
  Filled 2024-08-26: qty 30, 15d supply, fill #0

## 2024-08-26 NOTE — Telephone Encounter (Signed)
 Received approval letter [employer] ([employer] ) thru 08/28/2025,approval letter index.

## 2024-08-26 NOTE — Telephone Encounter (Signed)
 Last appt. 05/10/24

## 2024-08-27 NOTE — Progress Notes (Deleted)
 "   Assessment/Plan:    Tremor -likely mostly longstanding ET.  Does have a rest component now.  This is likely from longstanding essential tremor, but given gait instability and family history of Parkinson's, we decided to proceed with DaTscan. - Now that he is off of the Wellbutrin , we can get the patient scheduled for DaTscan.  He has actually been off of it for about 6 to 8 weeks, and both he and I were surprised that tremor was fairly significantly better when examined today.             - Hyperthyroidism may be contributing, but generally numbers look good and well-controlled with methimazole .             - Discussed with patient that his options for oral meds really are quite limited.  He is already on a beta-blocker (metoprolol ).  The any other first-line drug is primidone, and that interacts with the Eliquis , so he cannot be on that.  Topamax is considered first-line medication, but only in high dosages, which are often not tolerated in this age group.  I don't think that gabapentin will likely make a difference with this degree of tremor.             - Discussed surgical options in detail, including focused ultrasound and DBS.  He was shown HIPAA compliant videos of pt with ET who had DBS.  He is not sure if he would like to proceed with that, but he is going to think about that.  He and I talked about the fact that he would need to stop the tobacco prior to any type of DBS surgery.  He is also on Eliquis , which is a complicating factor but not contraindicated.             - Discussed Cala Trio and information given on that.             -decreasing caffeine  may be of value   2.  Tobacco abuse             - Discussed the importance of tobacco cessation.   3.  Mood d/o             - Patient weaned Wellbutrin  slowly for DaT scan, but has actually been off it for 6 to 8 weeks.  Mood has been pretty good off of the medication and very rarely he will feel somewhat depressed.  I did tell him he  may be able to get back on the medication at a lower dose, or find a different medication, especially since we think that that may have contributed to his tremor.  He was told to follow-up with his nurse practitioner following the DaTscan so he can get back on the medication.   4.  Hip pain             - Patient is status post right hip arthroplasty and is doing much better  Subjective:   Shaun [name] was seen today in follow up for essential tremor.  My previous records were reviewed prior to todays visit.  I saw the patient in August.  I thought he probably had longstanding essential tremor that now had a rest component because of the degree of essential tremor that he had.  However, I wanted to do a DaTscan.  He was on high dose Wellbutrin  at the time, so it could not be stopped for a week without having consequences.  I ended up  reaching out to his nurse practitioner for coordination of care.  She was helpful and stated that it was going to take some time to get him down off of the medication for the scan.  She saw the patient and told the patient to contact me once he was off of the medication so that we could order the scan.  Unfortunately, I never got a callback from the patient, so the scan was not ordered.  He did get his R hip replaced and he is so happy that ; I cannot believe I waited so long to have that done.   ALLERGIES:  No Known Allergies  CURRENT MEDICATIONS:  No outpatient medications have been marked as taking for the 09/04/24 encounter (Appointment) with Filimon Miranda, Asberry RAMAN, DO.   Current Facility-Administered Medications for the 09/04/24 encounter (Appointment) with Anastasiya Gowin, Asberry RAMAN, DO  Medication   lidocaine  (XYLOCAINE ) 1 % (with pres) injection 5 mL      Objective:    PHYSICAL EXAMINATION:    VITALS:   There were no vitals filed for this visit.   GEN:  The patient appears stated age and is in NAD. HEENT:  Normocephalic, atraumatic.  The mucous membranes are  moist. The superficial temporal arteries are without ropiness or tenderness. CV:  RRR Lungs:  CTAB Neck/HEME:  There are no carotid bruits bilaterally.  Neurological examination:  Orientation: The patient is alert and oriented x3. Cranial nerves: There is good facial symmetry. The speech is fluent and clear. Soft palate rises symmetrically and there is no tongue deviation. Hearing is intact to conversational tone. Sensation: Sensation is intact to light touch throughout Motor: Strength is at least antigravity x4.  Movement examination: Tone: There is ?min increased tone in the right upper extremity Abnormal movements: There is no rest tremor today.  There is mild postural tremor.  There is mild to moderate intention tremor.  He has trouble with Archimedes spirals bilaterally, but they are much better than last visit. Coordination:  There is no decremation with RAM's, with any form of RAMS, including alternating supination and pronation of the forearm, hand opening and closing, finger taps, heel taps and toe taps.  Gait and Station: The patient ambulates well in the hall today post right hip replacement I have reviewed and interpreted the following labs independently   Chemistry      Component Value Date/Time   NA 134 (L) 04/20/2024 0333   NA 135 12/07/2023 0821   K 4.1 04/20/2024 0333   CL 103 04/20/2024 0333   CO2 23 04/20/2024 0333   BUN 21 04/20/2024 0333   BUN 13 12/07/2023 0821   CREATININE 0.86 04/20/2024 0333   CREATININE 0.92 12/15/2017 0744      Component Value Date/Time   CALCIUM  8.2 (L) 04/20/2024 0333   ALKPHOS 85 12/07/2023 0821   AST 19 12/07/2023 0821   ALT 24 12/07/2023 0821   BILITOT 0.5 12/07/2023 0821      Lab Results  Component Value Date   WBC 14.9 (H) 04/20/2024   HGB 13.7 04/20/2024   HCT 43.1 04/20/2024   MCV 86.5 04/20/2024   PLT 141 (L) 04/20/2024   Lab Results  Component Value Date   TSH 0.48 03/25/2024     Chemistry      Component  Value Date/Time   NA 134 (L) 04/20/2024 0333   NA 135 12/07/2023 0821   K 4.1 04/20/2024 0333   CL 103 04/20/2024 0333   CO2 23 04/20/2024 0333  BUN 21 04/20/2024 0333   BUN 13 12/07/2023 0821   CREATININE 0.86 04/20/2024 0333   CREATININE 0.92 12/15/2017 0744      Component Value Date/Time   CALCIUM  8.2 (L) 04/20/2024 0333   ALKPHOS 85 12/07/2023 0821   AST 19 12/07/2023 0821   ALT 24 12/07/2023 0821   BILITOT 0.5 12/07/2023 0821     Total time spent on today's visit was *** minutes, including both face-to-face time and nonface-to-face time.  Time included that spent on review of records (prior notes available to me/labs/imaging if pertinent), discussing treatment and goals, answering patient's questions and coordinating care.   Cc:  Early, Camie BRAVO, NP  "

## 2024-08-28 ENCOUNTER — Observation Stay (HOSPITAL_COMMUNITY)
Admission: EM | Admit: 2024-08-28 | Discharge: 2024-08-29 | Disposition: A | Discharge status: Home / Self Care | Source: Ambulatory Visit | Attending: Family Medicine | Admitting: Family Medicine

## 2024-08-28 ENCOUNTER — Other Ambulatory Visit: Payer: Self-pay | Admitting: Physician Assistant

## 2024-08-28 ENCOUNTER — Other Ambulatory Visit: Payer: Self-pay

## 2024-08-28 ENCOUNTER — Other Ambulatory Visit: Payer: Self-pay | Admitting: Gastroenterology

## 2024-08-28 ENCOUNTER — Emergency Department (HOSPITAL_COMMUNITY)

## 2024-08-28 ENCOUNTER — Encounter (HOSPITAL_COMMUNITY): Payer: Self-pay | Admitting: Emergency Medicine

## 2024-08-28 ENCOUNTER — Ambulatory Visit: Payer: Self-pay

## 2024-08-28 DIAGNOSIS — J449 Chronic obstructive pulmonary disease, unspecified: Secondary | ICD-10-CM | POA: Diagnosis not present

## 2024-08-28 DIAGNOSIS — R0600 Dyspnea, unspecified: Principal | ICD-10-CM | POA: Insufficient documentation

## 2024-08-28 DIAGNOSIS — I4891 Unspecified atrial fibrillation: Secondary | ICD-10-CM | POA: Insufficient documentation

## 2024-08-28 DIAGNOSIS — E0590 Thyrotoxicosis, unspecified without thyrotoxic crisis or storm: Secondary | ICD-10-CM | POA: Diagnosis not present

## 2024-08-28 DIAGNOSIS — Z7901 Long term (current) use of anticoagulants: Secondary | ICD-10-CM | POA: Insufficient documentation

## 2024-08-28 DIAGNOSIS — E785 Hyperlipidemia, unspecified: Secondary | ICD-10-CM | POA: Diagnosis not present

## 2024-08-28 DIAGNOSIS — Z6832 Body mass index (BMI) 32.0-32.9, adult: Secondary | ICD-10-CM | POA: Diagnosis not present

## 2024-08-28 DIAGNOSIS — I2510 Atherosclerotic heart disease of native coronary artery without angina pectoris: Secondary | ICD-10-CM | POA: Insufficient documentation

## 2024-08-28 DIAGNOSIS — F39 Unspecified mood [affective] disorder: Secondary | ICD-10-CM | POA: Insufficient documentation

## 2024-08-28 DIAGNOSIS — I5022 Chronic systolic (congestive) heart failure: Secondary | ICD-10-CM | POA: Diagnosis not present

## 2024-08-28 DIAGNOSIS — Z85828 Personal history of other malignant neoplasm of skin: Secondary | ICD-10-CM | POA: Diagnosis not present

## 2024-08-28 DIAGNOSIS — I110 Hypertensive heart disease with heart failure: Secondary | ICD-10-CM | POA: Insufficient documentation

## 2024-08-28 DIAGNOSIS — G2581 Restless legs syndrome: Secondary | ICD-10-CM | POA: Insufficient documentation

## 2024-08-28 DIAGNOSIS — Z7984 Long term (current) use of oral hypoglycemic drugs: Secondary | ICD-10-CM | POA: Diagnosis not present

## 2024-08-28 DIAGNOSIS — E1165 Type 2 diabetes mellitus with hyperglycemia: Secondary | ICD-10-CM | POA: Insufficient documentation

## 2024-08-28 DIAGNOSIS — E669 Obesity, unspecified: Secondary | ICD-10-CM | POA: Diagnosis not present

## 2024-08-28 DIAGNOSIS — Z79899 Other long term (current) drug therapy: Secondary | ICD-10-CM | POA: Insufficient documentation

## 2024-08-28 DIAGNOSIS — R0602 Shortness of breath: Secondary | ICD-10-CM | POA: Diagnosis present

## 2024-08-28 DIAGNOSIS — I5020 Unspecified systolic (congestive) heart failure: Principal | ICD-10-CM

## 2024-08-28 DIAGNOSIS — Z20828 Contact with and (suspected) exposure to other viral communicable diseases: Secondary | ICD-10-CM | POA: Insufficient documentation

## 2024-08-28 DIAGNOSIS — Z87891 Personal history of nicotine dependence: Secondary | ICD-10-CM | POA: Diagnosis not present

## 2024-08-28 DIAGNOSIS — E119 Type 2 diabetes mellitus without complications: Secondary | ICD-10-CM | POA: Insufficient documentation

## 2024-08-28 LAB — RESP PANEL BY RT-PCR (RSV, FLU A&B, COVID)  RVPGX2
Influenza A by PCR: NEGATIVE
Influenza B by PCR: NEGATIVE
Resp Syncytial Virus by PCR: NEGATIVE
SARS Coronavirus 2 by RT PCR: NEGATIVE

## 2024-08-28 LAB — CBG MONITORING, ED: Glucose-Capillary: 307 mg/dL — ABNORMAL HIGH (ref 70–99)

## 2024-08-28 LAB — PRO BRAIN NATRIURETIC PEPTIDE: Pro Brain Natriuretic Peptide: 378 pg/mL — ABNORMAL HIGH

## 2024-08-28 LAB — BASIC METABOLIC PANEL WITH GFR
Anion gap: 10 (ref 5–15)
BUN: 15 mg/dL (ref 8–23)
CO2: 27 mmol/L (ref 22–32)
Calcium: 8.9 mg/dL (ref 8.9–10.3)
Chloride: 102 mmol/L (ref 98–111)
Creatinine, Ser: 0.81 mg/dL (ref 0.61–1.24)
GFR, Estimated: >60 mL/min
Glucose, Bld: 161 mg/dL — ABNORMAL HIGH (ref 70–99)
Potassium: 4.2 mmol/L (ref 3.5–5.1)
Sodium: 139 mmol/L (ref 135–145)

## 2024-08-28 LAB — CBC
HCT: 42.3 % (ref 39.0–52.0)
Hemoglobin: 14.3 g/dL (ref 13.0–17.0)
MCH: 28 pg (ref 26.0–34.0)
MCHC: 33.8 g/dL (ref 30.0–36.0)
MCV: 82.8 fL (ref 80.0–100.0)
Platelets: 192 K/uL (ref 150–400)
RBC: 5.11 MIL/uL (ref 4.22–5.81)
RDW: 13.8 % (ref 11.5–15.5)
WBC: 9.5 K/uL (ref 4.0–10.5)
nRBC: 0 % (ref 0.0–0.2)

## 2024-08-28 LAB — HEPATIC FUNCTION PANEL
ALT: 29 U/L (ref 0–44)
AST: 26 U/L (ref 15–41)
Albumin: 4.3 g/dL (ref 3.5–5.0)
Alkaline Phosphatase: 79 U/L (ref 38–126)
Bilirubin, Direct: 0.2 mg/dL (ref 0.0–0.2)
Indirect Bilirubin: 0.1 mg/dL — ABNORMAL LOW (ref 0.3–0.9)
Total Bilirubin: 0.2 mg/dL (ref 0.0–1.2)
Total Protein: 6.8 g/dL (ref 6.5–8.1)

## 2024-08-28 MED ORDER — FUROSEMIDE 10 MG/ML IJ SOLN
20.0000 mg | Freq: Two times a day (BID) | INTRAMUSCULAR | Status: AC
  Administered 2024-08-29 (×2): 20 mg via INTRAVENOUS
  Filled 2024-08-28 (×2): qty 2

## 2024-08-28 MED ORDER — ROPINIROLE HCL 1 MG PO TABS
3.0000 mg | ORAL_TABLET | Freq: Every day | ORAL | Status: DC
  Administered 2024-08-29: 3 mg via ORAL
  Filled 2024-08-28: qty 3

## 2024-08-28 MED ORDER — BUPROPION HCL ER (XL) 150 MG PO TB24: 450.0000 mg | ORAL_TABLET | Freq: Every day | ORAL | Status: DC

## 2024-08-28 MED ORDER — ROPINIROLE HCL 1 MG PO TABS
1.5000 mg | ORAL_TABLET | Freq: Every day | ORAL | Status: DC
  Filled 2024-08-28: qty 2

## 2024-08-28 MED ORDER — METOPROLOL TARTRATE 5 MG/5ML IV SOLN
5.0000 mg | Freq: Once | INTRAVENOUS | Status: AC
  Administered 2024-08-28: 5 mg via INTRAVENOUS

## 2024-08-28 MED ORDER — ACETAMINOPHEN 500 MG PO TABS: 500.0000 mg | ORAL_TABLET | Freq: Four times a day (QID) | ORAL | Status: DC | PRN

## 2024-08-28 MED ORDER — MAGNESIUM SULFATE 2 GM/50ML IV SOLN
2.0000 g | Freq: Once | INTRAVENOUS | Status: AC
  Administered 2024-08-28: 2 g via INTRAVENOUS
  Filled 2024-08-28: qty 50

## 2024-08-28 MED ORDER — ROPINIROLE HCL 1 MG PO TABS
1.0000 mg | ORAL_TABLET | Freq: Once | ORAL | Status: AC
  Administered 2024-08-28: 1 mg via ORAL
  Filled 2024-08-28: qty 1

## 2024-08-28 MED ORDER — METOPROLOL TARTRATE 50 MG PO TABS
100.0000 mg | ORAL_TABLET | Freq: Two times a day (BID) | ORAL | Status: DC
  Administered 2024-08-29: 100 mg via ORAL
  Filled 2024-08-28: qty 2

## 2024-08-28 MED ORDER — TRAZODONE HCL 50 MG PO TABS
150.0000 mg | ORAL_TABLET | Freq: Every day | ORAL | Status: DC
  Administered 2024-08-29: 150 mg via ORAL
  Filled 2024-08-28: qty 3

## 2024-08-28 MED ORDER — METOPROLOL TARTRATE 5 MG/5ML IV SOLN
5.0000 mg | Freq: Once | INTRAVENOUS | Status: DC
  Filled 2024-08-28: qty 5

## 2024-08-28 MED ORDER — METOPROLOL TARTRATE 50 MG PO TABS
100.0000 mg | ORAL_TABLET | Freq: Once | ORAL | Status: AC
  Administered 2024-08-28: 100 mg via ORAL
  Filled 2024-08-28: qty 2

## 2024-08-28 MED ORDER — POLYETHYLENE GLYCOL 3350 17 G PO PACK: 17.0000 g | PACK | Freq: Every day | ORAL | Status: DC | PRN

## 2024-08-28 MED ORDER — MELATONIN 5 MG PO TABS: 5.0000 mg | ORAL_TABLET | Freq: Every evening | ORAL | Status: DC | PRN

## 2024-08-28 MED ORDER — INSULIN ASPART 100 UNIT/ML IJ SOLN
0.0000 [IU] | Freq: Three times a day (TID) | INTRAMUSCULAR | Status: DC
  Administered 2024-08-29 (×2): 5 [IU] via SUBCUTANEOUS
  Filled 2024-08-28 (×2): qty 1

## 2024-08-28 MED ORDER — INSULIN ASPART 100 UNIT/ML IJ SOLN
0.0000 [IU] | Freq: Every day | INTRAMUSCULAR | Status: DC
  Administered 2024-08-28: 4 [IU] via SUBCUTANEOUS
  Filled 2024-08-28: qty 1

## 2024-08-28 MED ORDER — EZETIMIBE 10 MG PO TABS
10.0000 mg | ORAL_TABLET | Freq: Every day | ORAL | Status: DC
  Administered 2024-08-29: 10 mg via ORAL
  Filled 2024-08-28: qty 1

## 2024-08-28 MED ORDER — ATORVASTATIN CALCIUM 40 MG PO TABS
80.0000 mg | ORAL_TABLET | Freq: Every evening | ORAL | Status: DC
  Administered 2024-08-28: 80 mg via ORAL
  Filled 2024-08-28: qty 2

## 2024-08-28 MED ORDER — PROCHLORPERAZINE EDISYLATE 10 MG/2ML IJ SOLN: 5.0000 mg | Freq: Four times a day (QID) | INTRAMUSCULAR | Status: DC | PRN

## 2024-08-28 MED ORDER — METHIMAZOLE 5 MG PO TABS
5.0000 mg | ORAL_TABLET | ORAL | Status: DC
  Administered 2024-08-29: 5 mg via ORAL
  Filled 2024-08-28: qty 1

## 2024-08-28 MED ORDER — METHYLPREDNISOLONE SODIUM SUCC 125 MG IJ SOLR
125.0000 mg | Freq: Once | INTRAMUSCULAR | Status: AC
  Administered 2024-08-28: 125 mg via INTRAVENOUS
  Filled 2024-08-28: qty 2

## 2024-08-28 MED ORDER — IPRATROPIUM-ALBUTEROL 0.5-2.5 (3) MG/3ML IN SOLN
3.0000 mL | Freq: Once | RESPIRATORY_TRACT | Status: AC
  Administered 2024-08-28: 3 mL via RESPIRATORY_TRACT
  Filled 2024-08-28: qty 3

## 2024-08-28 MED ORDER — APIXABAN 5 MG PO TABS
5.0000 mg | ORAL_TABLET | Freq: Two times a day (BID) | ORAL | Status: DC
  Administered 2024-08-28 – 2024-08-29 (×2): 5 mg via ORAL
  Filled 2024-08-28 (×2): qty 1

## 2024-08-28 NOTE — H&P (Addendum)
 " History and Physical  Shaun [name] FMW:[id number] DOB: 07-26-1956 DOA: 08/28/2024  Referring physician: Dr. Suzette, EDP  PCP: Early, Camie BRAVO, NP  Outpatient Specialists: Cardiology. Patient coming from: Home.  Chief Complaint: Shortness of breath.  HPI: Shaun [name] is a 68 y.o. male with medical history significant for hypothyroidism, mood disorder, type 2 diabetes, hypertension, hyperlipidemia, chronic HFpEF, COPD, restless leg syndrome, who presents to the ER with complaints of shortness of breath that started this afternoon.  Admits to exposure to influenza A by his wife who was admitted.  In the ER, tachypneic, tachycardic in A-fib with RVR with rates in the 130s.  proBNP mildly elevated 378.  Mild pulmonary edema on chest x-ray.  Influenza A and B, COVID-19, RSV, all negative.  The patient received IV Lopressor  5 mg x 2, p.o. Lopressor  100 mg x 1, IV Solu-Medrol  125 mg x 1, IV magnesium  2 g x 1, DuoNebs x 1 with some improvement.  TRH, hospitalist service, was asked to admit for further management of dyspnea.  ED Course: Temperature 99.  BP 146/97, pulse 104, respiration rate 24, O2 saturation 93% on room air.  Review of Systems: Review of systems as noted in the HPI. All other systems reviewed and are negative.   Past Medical History:  Diagnosis Date   A-fib [facility])    Anginal pain    Anxiety    Bacterial URI 08/29/2022   CAD (coronary artery disease)    DES to distal circumflex 2016   Cataract    CHF (congestive heart failure) (HCC)    Colon polyps    30 colon polyps found on first colonoscopy   Complication of anesthesia    COPD (chronic obstructive pulmonary disease) (HCC)    Depression    Diastolic heart failure (HCC)    Diverticulitis    DJD (degenerative joint disease)    Dyspnea    Dysrhythmia    A-Fib   Genetic testing 09/09/2014   Negative genetic testing on the ColoNext panel test and the MSH2 inversion testing.  The ColoNext gene panel offered  by [facility] and includes sequencing and rearrangement analysis for the following 17 genes: APC, BMPR1A, CDH1, CHEK2, EPCAM, GREM1, MLH1, MSH2, MSH6, MUTYH, PMS2, POLD1, POLE, PTEN, SMAD4, STK11, and TP53.   The report date is 09/08/14.      GERD (gastroesophageal reflux disease)    History of kidney stones    Hyperlipidemia    Hypernatremia 08/22/2021   Hypertension    Insomnia    Obstructive sleep apnea 12/2009   01/26/2010 AHI 83/hr   Permanent atrial fibrillation (HCC)    Onset 2006 paroxysmal then progressive to persistent   Pneumonia    PONV (postoperative nausea and vomiting)    PUD (peptic ulcer disease)    1980s   RLS (restless legs syndrome)    Sinusitis    Skin cancer    Tremor    Type 2 diabetes mellitus (HCC)    Past Surgical History:  Procedure Laterality Date   BIOPSY  07/17/2018   Procedure: BIOPSY;  Surgeon: Harvey Margo CROME, MD;  Location: [facility];  Service: Endoscopy;;  colon   BOWEL RESECTION  09/17/2018   SMALL BOWEL RESECTION: 71 CM    CARDIAC CATHETERIZATION N/A 07/21/2015   Procedure: Left Heart Cath and Coronary Angiography;  Surgeon: Peter M Jordan, MD;  Location: [facility] INVASIVE CV LAB;  Service: Cardiovascular;  Laterality: N/A;   CARDIAC CATHETERIZATION N/A 07/21/2015   Procedure: Coronary Stent  Intervention;  Surgeon: Peter M Jordan, MD;  Location: [facility] INVASIVE CV LAB;  Service: Cardiovascular;  Laterality: N/A;   CIRCUMCISION N/A 04/05/2019   Procedure: CIRCUMCISION ADULT;  Surgeon: Watt Rush, MD;  Location: [facility] ORS;  Service: Urology;  Laterality: N/A;   COLONOSCOPY N/A 05/19/2014   Dr. Sharla diverticulosis/moderate external hemorrhoids, >20 simple adenomas. Genetic screening negative.    COLONOSCOPY WITH PROPOFOL  N/A 07/17/2018   Dr. harvey: Diverticulosis, external/internal hemorrhoids, 32 colon polyps removed.  ten tubular adenomas removed with no high-grade dysplasia.  Advised to have surveillance colonoscopy in 3 years.   COLONOSCOPY  WITH PROPOFOL  N/A 06/07/2021   Procedure: COLONOSCOPY WITH PROPOFOL ;  Surgeon: Cindie Carlin POUR, DO;  Location: [facility];  Service: Endoscopy;  Laterality: N/A;  9:30 / ASA 3  (Pt was told that his time will be given at Pre-op)   CORONARY PRESSURE/FFR STUDY Left 06/08/2017   Procedure: INTRAVASCULAR PRESSURE WIRE/FFR STUDY;  Surgeon: Mady Bruckner, MD;  Location: [facility] INVASIVE CV LAB;  Service: Cardiovascular;  Laterality: Left;  LAD and CFX   ESOPHAGOGASTRODUODENOSCOPY (EGD) WITH PROPOFOL  N/A 07/17/2018   Dr. harvey: Low-grade narrowing Schatzki ring at the GE junction status post dilation.  Gastritis.  Biopsy with mild nonspecific reactive gastropathy.  No H. pylori.   GIVENS CAPSULE STUDY N/A 06/24/2019   normal   HERNIA REPAIR  1986   Left inguinal   INCISIONAL HERNIA REPAIR N/A 10/19/2022   Procedure: OPEN INCISIONAL HERNIA REPAIR;  Surgeon: Dasie Leonor CROME, MD;  Location: [facility] OR;  Service: General;  Laterality: N/A;   INSERTION OF MESH N/A 10/19/2022   Procedure: INSERTION OF MESH;  Surgeon: Dasie Leonor CROME, MD;  Location: [facility] OR;  Service: General;  Laterality: N/A;   LAPAROTOMY N/A 09/17/2018   Procedure: EXPLORATORY LAPAROTOMY;  Surgeon: Kallie Manuelita BROCKS, MD;  Location: [facility] ORS;  Service: General;  Laterality: N/A;   LEFT HEART CATH AND CORONARY ANGIOGRAPHY N/A 06/08/2017   Procedure: LEFT HEART CATH AND CORONARY ANGIOGRAPHY;  Surgeon: Mady Bruckner, MD;  Location: [facility] INVASIVE CV LAB;  Service: Cardiovascular;  Laterality: N/A;   POLYPECTOMY  07/17/2018   Procedure: POLYPECTOMY;  Surgeon: Harvey Margo CROME, MD;  [facility];  Service: Endoscopy;;  colon   POLYPECTOMY  06/07/2021   Procedure: POLYPECTOMY INTESTINAL;  Surgeon: Cindie Carlin POUR, DO;  [facility];  Service: Endoscopy;;   RIGHT/LEFT HEART CATH AND CORONARY ANGIOGRAPHY N/A 09/19/2023   Procedure: RIGHT/LEFT HEART CATH AND CORONARY ANGIOGRAPHY;  Surgeon: Verlin Bruckner BIRCH, MD;  Location: [facility]  INVASIVE CV LAB;  Service: Cardiovascular;  Laterality: N/A;   ROTATOR CUFF REPAIR     Right   SAVORY DILATION N/A 07/17/2018   Procedure: SAVORY DILATION;  Surgeon: Harvey Margo CROME, MD;  Location: [facility];  Service: Endoscopy;  Laterality: N/A;   TOTAL HIP ARTHROPLASTY Right 04/19/2024   Procedure: ARTHROPLASTY, HIP, TOTAL, ANTERIOR APPROACH;  Surgeon: Vernetta Bruckner GRADE, MD;  Location: [facility] ORS;  Service: Orthopedics;  Laterality: Right;  Needs RNFA    Social History:  reports that he quit smoking about 4 months ago. His smoking use included cigarettes. He has been exposed to tobacco smoke. He has never used smokeless tobacco. He reports that he does not currently use alcohol. He reports current drug use. Drug: Hydrocodone .   Allergies[1]  Family History  Problem Relation Age of Onset   Hypertension Mother    Breast cancer Mother 71       brain/bone  Heart attack Father    Skin cancer Sister 74   Breast cancer Sister 86   Diabetes Brother    Parkinson's disease Brother    Brain cancer Maternal Uncle    Cancer Maternal Uncle        NOS   Breast cancer Cousin        maternal cousin dx <50   Cancer Cousin    Colon cancer Neg Hx       Prior to Admission medications  Medication Sig Start Date End Date Taking? Authorizing Provider  albuterol  (PROVENTIL ) (2.5 MG/3ML) 0.083% nebulizer solution Take 3 mLs (2.5 mg total) by nebulization every 6 (six) hours as needed for wheezing or shortness of breath. 06/17/23   Dreama Longs, MD  albuterol  (VENTOLIN  HFA) 108 (90 Base) MCG/ACT inhaler INHALE 2 PUFFS INTO THE LUNGS EVERY 6 HOURS AS NEEDED FOR WHEEZING OR SHORTNESS OF BREATH 01/12/23   Tysinger, Alm RAMAN, PA-C  apixaban  (ELIQUIS ) 5 MG TABS tablet Take 1 tablet (5 mg total) by mouth 2 (two) times daily. 10/20/23   Pietro Redell RAMAN, MD  ascorbic acid  (VITAMIN C ) 500 MG tablet Take 500 mg by mouth daily.    [provider]  atorvastatin  (LIPITOR ) 80 MG tablet Take 1  tablet (80 mg total) by mouth every evening. 03/14/24   Meng, Hao, PA  buPROPion  (WELLBUTRIN  XL) 150 MG 24 hr tablet Take 3 tablets (450 mg total) by mouth daily. 10/23/23   Early, Sara E, NP  docusate sodium  (COLACE) 100 MG capsule Take 1 capsule (100 mg total) by mouth daily as needed for mild constipation. 11/29/23   Early, Sara E, NP  empagliflozin  (JARDIANCE ) 10 MG TABS tablet Take 10 mg by mouth daily. Through [employer] P[facility]    [provider]  escitalopram  (LEXAPRO ) 20 MG tablet Take 20 mg by mouth in the morning.    [provider]  ezetimibe  (ZETIA ) 10 MG tablet Take 1 tablet (10 mg total) by mouth daily. 09/07/23 09/06/24  Pietro Redell RAMAN, MD  ferrous sulfate  (FEROSUL) 325 (65 FE) MG tablet Take 1 tablet (325 mg total) by mouth 3 (three) times a week. 07/22/24   Early, Sara E, NP  furosemide  (LASIX ) 40 MG tablet Take 1 tablet (40 mg total) by mouth daily. May also take additional ONE-HALF tablet (20 mg total) as needed if shortness of breath or swelling occurs. 07/22/24   Early, Sara E, NP  HYDROcodone -acetaminophen  (NORCO/VICODIN) 5-325 MG tablet Take 1-2 tablets by mouth every 12 (twelve) hours as needed for moderate pain (pain score 4-6) or severe pain (pain score 7-10). 07/09/24   Early, Sara E, NP  hydrOXYzine  (ATARAX ) 25 MG tablet Take 1 tablet (25 mg total) by mouth at bedtime and may repeat dose one time if needed for sleep. 04/08/24   Early, Sara E, NP  metFORMIN  (GLUCOPHAGE ) 500 MG tablet Take 1 tablet (500 mg total) by mouth 2 (two) times daily before a meal. 08/12/24   Early, Camie BRAVO, NP  methimazole  (TAPAZOLE ) 5 MG tablet Take 1 tablet (5 mg total) by mouth as directed. 1 tablet 4 days a week 03/25/24   [name], Ibtehal Jaralla, MD  metoprolol  tartrate (LOPRESSOR ) 100 MG tablet Take 1 tablet (100 mg total) by mouth 2 (two) times daily. 09/15/23 09/09/24  Meng, Hao, PA  nitroGLYCERIN  (NITROSTAT ) 0.4 MG SL tablet Place 1 tablet (0.4 mg total) under the tongue every 5 (five)  minutes as needed. 07/29/22   Early, Sara E, NP  pantoprazole  (  PROTONIX ) 40 MG tablet Take 1 tablet (40 mg total) by mouth daily before breakfast. 09/12/23   Ezzard Sonny RAMAN, PA-C  polyethylene glycol powder (GLYCOLAX /MIRALAX ) 17 GM/SCOOP powder Take one capful (17g) twice daily until soft stool, then continue one capful once daily as needed. 09/12/23   Ezzard Sonny RAMAN, PA-C  potassium chloride  SA (KLOR-CON  M) 20 MEQ tablet Take 1 tablet (20 mEq total) by mouth daily. 04/24/24   Pietro Redell RAMAN, MD  rOPINIRole  (REQUIP ) 3 MG tablet Take 1 tablet (3 mg total) by mouth at bedtime, AND 0.5 tablets (1.5 mg total) during the day for breakthrough symptoms. 08/19/24   Early, Sara E, NP  sildenafil  (VIAGRA ) 100 MG tablet Take 1 tablet (100 mg total) by mouth as needed for erectile dysfunction. Do not take nitroglycerin  within 24 hours of last dose. 02/01/24   Early, Sara E, NP  Tdap (BOOSTRIX ) 5-2.5-18.5 LF-MCG/0.5 injection Inject into the muscle. 06/14/24   Luiz Channel, MD  tiZANidine  (ZANAFLEX ) 4 MG tablet Take 1 tablet (4 mg total) by mouth every 12 (twelve) hours as needed for muscle spasms. Only take if pain is not under a 5 with other medications. 08/26/24   Early, Sara E, NP  traZODone  (DESYREL ) 150 MG tablet Take 1 tablet (150 mg total) by mouth at bedtime. 05/03/24   Early, Sara E, NP  Vitamin D , Ergocalciferol , (DRISDOL ) 1.25 MG (50000 UNIT) CAPS capsule Take 1 capsule (50,000 Units total) by mouth every 7 (seven) days. 12/08/23   Oris Camie BRAVO, NP    Physical Exam: BP (!) 148/94   Pulse 90   Temp 99 F (37.2 C) (Oral)   Resp 17   Ht 5' 8 (1.727 m)   Wt 98.4 kg   SpO2 91%   BMI 32.99 kg/m   General: 68 y.o. year-old male well developed well nourished in no acute distress.  Alert and oriented x3. Cardiovascular: Regular rate and rhythm with no rubs or gallops.  No thyromegaly or JVD noted.  No lower extremity edema. 2/4 pulses in all 4 extremities. Respiratory: Clear to auscultation with no  wheezes or rales. Good inspiratory effort. Abdomen: Soft nontender nondistended with normal bowel sounds x4 quadrants. Muskuloskeletal: No cyanosis, clubbing or edema noted bilaterally Neuro: CN II-XII intact, strength, sensation, reflexes Skin: No ulcerative lesions noted or rashes Psychiatry: Judgement and insight appear normal. Mood is appropriate for condition and setting          Labs on Admission:  Basic Metabolic Panel: Recent Labs  Lab 08/28/24 1948  NA 139  K 4.2  CL 102  CO2 27  GLUCOSE 161*  BUN 15  CREATININE 0.81  CALCIUM  8.9   Liver Function Tests: Recent Labs  Lab 08/28/24 1948  AST 26  ALT 29  ALKPHOS 79  BILITOT 0.2  PROT 6.8  ALBUMIN  4.3   No results for input(s): LIPASE, AMYLASE in the last 168 hours. No results for input(s): AMMONIA in the last 168 hours. CBC: Recent Labs  Lab 08/28/24 1948  WBC 9.5  HGB 14.3  HCT 42.3  MCV 82.8  PLT 192   Cardiac Enzymes: No results for input(s): CKTOTAL, CKMB, CKMBINDEX, TROPONINI in the last 168 hours.  BNP (last 3 results) No results for input(s): BNP in the last 8760 hours.  ProBNP (last 3 results) Recent Labs    08/28/24 1948  PROBNP 378.0*    CBG: No results for input(s): GLUCAP in the last 168 hours.  Radiological Exams on Admission: [facility] Chest [facility]  1 View Result Date: 08/28/2024 EXAM: 1 VIEW(S) XRAY OF THE CHEST 08/28/2024 07:43:00 PM COMPARISON: 06/17/2023 CLINICAL HISTORY: SOB (shortness of breath) FINDINGS: LUNGS AND PLEURA: Mild interstitial opacities. No pleural effusion. No pneumothorax. HEART AND MEDIASTINUM: Cardiomegaly. BONES AND SOFT TISSUES: No acute osseous abnormality. IMPRESSION: 1. Mild interstitial opacities, likely mild edema . 2. Cardiomegaly. Electronically signed by: Greig Pique MD 08/28/2024 08:58 PM EST RP Workstation: [id number]    EKG: I independently viewed the EKG done and my findings are as followed: A-fib 103.  QTc  425.  Assessment/Plan Present on Admission:  Dyspnea  Principal Problem:   Dyspnea  Dyspnea likely secondary to mild pulmonary edema versus A-fib with RVR Diuresis as blood pressure allows Rate is currently controlled Resume home rate control agents Resume home Eliquis  for CVA prevention Monitor on telemetry  Permanent atrial fibrillation with RVR Currently rate controlled Resume home rate control agents Resume home Eliquis   Hyperlipidemia Resume home statin and Zetia   Hyperthyroidism Resume home methimazole   Chronic HFpEF Mild proBNP less than 400, mild pulmonary edema on chest x-ray IV Lasix  20 mg twice daily x 2  Obesity BMI 32 Recommend weight loss outpatient with regular physical activity and healthy dieting.  Type 2 diabetes with hyperglycemia Last hemoglobin A1c 7.5 on 03/14/2024. Heart healthy, carb modified diet Insulin  coverage.  Mood disorder Restless leg syndrome Resume home regimen.   Time: 75 minutes.   DVT prophylaxis: On Eliquis  twice daily.  Code Status: Full code.  Family Communication: None at bedside.  Disposition Plan: Admitted to telemetry unit.  Consults called: None.  Admission status: Observation status.   Status is: Observation    Terry LOISE Hurst MD [facility] Pager [phone]  If 7PM-7AM, please contact night-coverage [email or web address] Password [email or web address]  08/28/2024, 10:57 PM      [1] No Known Allergies  "

## 2024-08-28 NOTE — H&P (Incomplete)
 " History and Physical  Shaun [name] FMW:[id number] DOB: Oct 17, 1955 DOA: 08/28/2024  Referring physician: Dr. Suzette, EDP  PCP: Early, Camie BRAVO, NP  Outpatient Specialists: Cardiology. Patient coming from: Home.  Chief Complaint: Shortness of breath.  HPI: Shaun [name] is a 68 y.o. male with medical history significant for hypothyroidism, mood disorder, type 2 diabetes, hypertension, hyperlipidemia, chronic HFpEF, COPD, restless leg syndrome, who presents to the ER with complaints of shortness of breath that started this afternoon.  Admits to exposure to influenza A by his wife who was admitted.  In the ER, tachypneic, tachycardic in A-fib with RVR with rates in the 130s.  proBNP mildly elevated 378.  Mild pulmonary edema on chest x-ray.  Influenza A and B, COVID-19, RSV, all negative.  The patient received IV Lopressor  5 mg x 2, p.o. Lopressor  100 mg x 1, IV Solu-Medrol  125 mg x 1, IV magnesium  2 g x 1, DuoNebs x 1 with some improvement.  TRH, hospitalist service, was asked to admit for further management of dyspnea.  ED Course: Temperature 99.  BP 146/97, pulse 104, respiration rate 24, O2 saturation 93% on room air.  Review of Systems: Review of systems as noted in the HPI. All other systems reviewed and are negative.   Past Medical History:  Diagnosis Date   A-fib [facility])    Anginal pain    Anxiety    Bacterial URI 08/29/2022   CAD (coronary artery disease)    DES to distal circumflex 2016   Cataract    CHF (congestive heart failure) (HCC)    Colon polyps    30 colon polyps found on first colonoscopy   Complication of anesthesia    COPD (chronic obstructive pulmonary disease) (HCC)    Depression    Diastolic heart failure (HCC)    Diverticulitis    DJD (degenerative joint disease)    Dyspnea    Dysrhythmia    A-Fib   Genetic testing 09/09/2014   Negative genetic testing on the ColoNext panel test and the MSH2 inversion testing.  The ColoNext  gene panel offered by [facility] and includes sequencing and rearrangement analysis for the following 17 genes: APC, BMPR1A, CDH1, CHEK2, EPCAM, GREM1, MLH1, MSH2, MSH6, MUTYH, PMS2, POLD1, POLE, PTEN, SMAD4, STK11, and TP53.   The report date is 09/08/14.      GERD (gastroesophageal reflux disease)    History of kidney stones    Hyperlipidemia    Hypernatremia 08/22/2021   Hypertension    Insomnia    Obstructive sleep apnea 12/2009   01/26/2010 AHI 83/hr   Permanent atrial fibrillation (HCC)    Onset 2006 paroxysmal then progressive to persistent   Pneumonia    PONV (postoperative nausea and vomiting)    PUD (peptic ulcer disease)    1980s   RLS (restless legs syndrome)    Sinusitis    Skin cancer    Tremor    Type 2 diabetes mellitus (HCC)    Past Surgical History:  Procedure Laterality Date   BIOPSY  07/17/2018   Procedure: BIOPSY;  Surgeon: Harvey Margo CROME, MD;  Location: [facility];  Service: Endoscopy;;  colon   BOWEL RESECTION  09/17/2018   SMALL BOWEL RESECTION: 71 CM    CARDIAC CATHETERIZATION N/A 07/21/2015   Procedure: Left Heart Cath and Coronary Angiography;  Surgeon: Peter M Jordan, MD;  Location: [facility] INVASIVE CV LAB;  Service: Cardiovascular;  Laterality: N/A;   CARDIAC CATHETERIZATION N/A 07/21/2015   Procedure: Coronary Stent  Intervention;  Surgeon: Peter M Jordan, MD;  Location: [facility] INVASIVE CV LAB;  Service: Cardiovascular;  Laterality: N/A;   CIRCUMCISION N/A 04/05/2019   Procedure: CIRCUMCISION ADULT;  Surgeon: Watt Rush, MD;  Location: [facility] OR[facility];  Service: Urology;  Laterality: N/A;   COLONOSCOPY N/A 05/19/2014   Dr. Sharla diverticulosis/moderate external hemorrhoids, >20 simple adenomas. Genetic screening negative.    COLONOSCOPY WITH PROPOFOL  N/A 07/17/2018   Dr. harvey: Diverticulosis, external/internal hemorrhoids, 32 colon polyps removed.  ten tubular adenomas removed with no high-grade dysplasia.  Advised to have surveillance  colonoscopy in 3 years.   COLONOSCOPY WITH PROPOFOL  N/A 06/07/2021   Procedure: COLONOSCOPY WITH PROPOFOL ;  Surgeon: Cindie Carlin POUR, DO;  Location: [facility];  Service: Endoscopy;  Laterality: N/A;  9:30 / ASA 3  (Pt was told that his time will be given at Pre-op)   CORONARY PRESSURE/FFR STUDY Left 06/08/2017   Procedure: INTRAVASCULAR PRESSURE WIRE/FFR STUDY;  Surgeon: Mady Bruckner, MD;  Location: [facility] INVASIVE CV LAB;  Service: Cardiovascular;  Laterality: Left;  LAD and CFX   ESOPHAGOGASTRODUODENOSCOPY (EGD) WITH PROPOFOL  N/A 07/17/2018   Dr. harvey: Low-grade narrowing Schatzki ring at the GE junction status post dilation.  Gastritis.  Biopsy with mild nonspecific reactive gastropathy.  No H. pylori.   GIVENS CAPSULE STUDY N/A 06/24/2019   normal   HERNIA REPAIR  1986   Left inguinal   INCISIONAL HERNIA REPAIR N/A 10/19/2022   Procedure: OPEN INCISIONAL HERNIA REPAIR;  Surgeon: Dasie Leonor CROME, MD;  Location: [facility] OR;  Service: General;  Laterality: N/A;   INSERTION OF MESH N/A 10/19/2022   Procedure: INSERTION OF MESH;  Surgeon: Dasie Leonor CROME, MD;  Location: [facility] OR;  Service: General;  Laterality: N/A;   LAPAROTOMY N/A 09/17/2018   Procedure: EXPLORATORY LAPAROTOMY;  Surgeon: Kallie Manuelita BROCKS, MD;  Location: [facility];  Service: General;  Laterality: N/A;   LEFT HEART CATH AND CORONARY ANGIOGRAPHY N/A 06/08/2017   Procedure: LEFT HEART CATH AND CORONARY ANGIOGRAPHY;  Surgeon: Mady Bruckner, MD;  Location: [facility] INVASIVE CV LAB;  Service: Cardiovascular;  Laterality: N/A;   POLYPECTOMY  07/17/2018   Procedure: POLYPECTOMY;  Surgeon: Harvey Margo CROME, MD;  Location: [facility];  Service: Endoscopy;;  colon   POLYPECTOMY  06/07/2021   Procedure: POLYPECTOMY INTESTINAL;  Surgeon: Cindie Carlin POUR, DO;  Location: [facility];  Service: Endoscopy;;   RIGHT/LEFT HEART CATH AND CORONARY ANGIOGRAPHY N/A 09/19/2023   Procedure: RIGHT/LEFT HEART CATH AND CORONARY ANGIOGRAPHY;   Surgeon: Verlin Bruckner BIRCH, MD;  Location: [facility] INVASIVE CV LAB;  Service: Cardiovascular;  Laterality: N/A;   ROTATOR CUFF REPAIR     Right   SAVORY DILATION N/A 07/17/2018   Procedure: SAVORY DILATION;  Surgeon: Harvey Margo CROME, MD;  Location: [facility];  Service: Endoscopy;  Laterality: N/A;   TOTAL HIP ARTHROPLASTY Right 04/19/2024   Procedure: ARTHROPLASTY, HIP, TOTAL, ANTERIOR APPROACH;  Surgeon: Vernetta Bruckner GRADE, MD;  Location: [facility];  Service: Orthopedics;  Laterality: Right;  Needs RNFA    Social History:  reports that he quit smoking about 4 months ago. His smoking use included cigarettes. He has been exposed to tobacco smoke. He has never used smokeless tobacco. He reports that he does not currently use alcohol. He reports current drug use. Drug: Hydrocodone .   Allergies[1]  Family History  Problem Relation Age of Onset   Hypertension Mother    Breast cancer Mother 60       brain/bone  Heart attack Father    Skin cancer Sister 39   Breast cancer Sister 45   Diabetes Brother    Parkinson's disease Brother    Brain cancer Maternal Uncle    Cancer Maternal Uncle        NOS   Breast cancer Cousin        maternal cousin dx <50   Cancer Cousin    Colon cancer Neg Hx       Prior to Admission medications  Medication Sig Start Date End Date Taking? Authorizing Provider  albuterol  (PROVENTIL ) (2.5 MG/3ML) 0.083% nebulizer solution Take 3 mLs (2.5 mg total) by nebulization every 6 (six) hours as needed for wheezing or shortness of breath. 06/17/23   Dreama Longs, MD  albuterol  (VENTOLIN  HFA) 108 (90 Base) MCG/ACT inhaler INHALE 2 PUFFS INTO THE LUNGS EVERY 6 HOURS AS NEEDED FOR WHEEZING OR SHORTNESS OF BREATH 01/12/23   Tysinger, Alm RAMAN, PA-C  apixaban  (ELIQUIS ) 5 MG TABS tablet Take 1 tablet (5 mg total) by mouth 2 (two) times daily. 10/20/23   Pietro Redell RAMAN, MD  ascorbic acid  (VITAMIN C ) 500 MG tablet Take 500 mg by mouth daily.     [provider]  atorvastatin  (LIPITOR ) 80 MG tablet Take 1 tablet (80 mg total) by mouth every evening. 03/14/24   Meng, Hao, PA  buPROPion  (WELLBUTRIN  XL) 150 MG 24 hr tablet Take 3 tablets (450 mg total) by mouth daily. 10/23/23   Early, Sara E, NP  docusate sodium  (COLACE) 100 MG capsule Take 1 capsule (100 mg total) by mouth daily as needed for mild constipation. 11/29/23   Early, Sara E, NP  empagliflozin  (JARDIANCE ) 10 MG TABS tablet Take 10 mg by mouth daily. Through [employer] [facility]    [provider]  escitalopram  (LEXAPRO ) 20 MG tablet Take 20 mg by mouth in the morning.    [provider]  ezetimibe  (ZETIA ) 10 MG tablet Take 1 tablet (10 mg total) by mouth daily. 09/07/23 09/06/24  Pietro Redell RAMAN, MD  ferrous sulfate  (FEROSUL) 325 (65 FE) MG tablet Take 1 tablet (325 mg total) by mouth 3 (three) times a week. 07/22/24   Early, Sara E, NP  furosemide  (LASIX ) 40 MG tablet Take 1 tablet (40 mg total) by mouth daily. May also take additional ONE-HALF tablet (20 mg total) as needed if shortness of breath or swelling occurs. 07/22/24   Early, Sara E, NP  HYDROcodone -acetaminophen  (NORCO/VICODIN) 5-325 MG tablet Take 1-2 tablets by mouth every 12 (twelve) hours as needed for moderate pain (pain score 4-6) or severe pain (pain score 7-10). 07/09/24   Early, Sara E, NP  hydrOXYzine  (ATARAX ) 25 MG tablet Take 1 tablet (25 mg total) by mouth at bedtime and may repeat dose one time if needed for sleep. 04/08/24   Early, Sara E, NP  metFORMIN  (GLUCOPHAGE ) 500 MG tablet Take 1 tablet (500 mg total) by mouth 2 (two) times daily before a meal. 08/12/24   Early, Camie BRAVO, NP  methimazole  (TAPAZOLE ) 5 MG tablet Take 1 tablet (5 mg total) by mouth as directed. 1 tablet 4 days a week 03/25/24   [name], Ibtehal Jaralla, MD  metoprolol  tartrate (LOPRESSOR ) 100 MG tablet Take 1 tablet (100 mg total) by mouth 2 (two) times daily. 09/15/23 09/09/24  Meng, Hao, PA  nitroGLYCERIN  (NITROSTAT ) 0.4 MG SL  tablet Place 1 tablet (0.4 mg total) under the tongue every 5 (five) minutes as needed. 07/29/22   Early, Sara E, NP  pantoprazole  (  PROTONIX ) 40 MG tablet Take 1 tablet (40 mg total) by mouth daily before breakfast. 09/12/23   Ezzard Sonny RAMAN, PA-C  polyethylene glycol powder (GLYCOLAX /MIRALAX ) 17 GM/SCOOP powder Take one capful (17g) twice daily until soft stool, then continue one capful once daily as needed. 09/12/23   Ezzard Sonny RAMAN, PA-C  potassium chloride  SA (KLOR-CON  M) 20 MEQ tablet Take 1 tablet (20 mEq total) by mouth daily. 04/24/24   Pietro Redell RAMAN, MD  rOPINIRole  (REQUIP ) 3 MG tablet Take 1 tablet (3 mg total) by mouth at bedtime, AND 0.5 tablets (1.5 mg total) during the day for breakthrough symptoms. 08/19/24   Early, Sara E, NP  sildenafil  (VIAGRA ) 100 MG tablet Take 1 tablet (100 mg total) by mouth as needed for erectile dysfunction. Do not take nitroglycerin  within 24 hours of last dose. 02/01/24   Early, Sara E, NP  Tdap (BOOSTRIX ) 5-2.5-18.5 LF-MCG/0.5 injection Inject into the muscle. 06/14/24   Luiz Channel, MD  tiZANidine  (ZANAFLEX ) 4 MG tablet Take 1 tablet (4 mg total) by mouth every 12 (twelve) hours as needed for muscle spasms. Only take if pain is not under a 5 with other medications. 08/26/24   Early, Sara E, NP  traZODone  (DESYREL ) 150 MG tablet Take 1 tablet (150 mg total) by mouth at bedtime. 05/03/24   Early, Sara E, NP  Vitamin D , Ergocalciferol , (DRISDOL ) 1.25 MG (50000 UNIT) CAPS capsule Take 1 capsule (50,000 Units total) by mouth every 7 (seven) days. 12/08/23   Oris Camie BRAVO, NP    Physical Exam: BP (!) 148/94   Pulse 90   Temp 99 F (37.2 C) (Oral)   Resp 17   Ht 5' 8 (1.727 m)   Wt 98.4 kg   SpO2 91%   BMI 32.99 kg/m   General: 68 y.o. year-old male well developed well nourished in no acute distress.  Alert and oriented x3. Cardiovascular: Regular rate and rhythm with no rubs or gallops.  No thyromegaly or JVD noted.  No lower extremity edema. 2/4  pulses in all 4 extremities. Respiratory: Clear to auscultation with no wheezes or rales. Good inspiratory effort. Abdomen: Soft nontender nondistended with normal bowel sounds x4 quadrants. Muskuloskeletal: No cyanosis, clubbing or edema noted bilaterally Neuro: CN II-XII intact, strength, sensation, reflexes Skin: No ulcerative lesions noted or rashes Psychiatry: Judgement and insight appear normal. Mood is appropriate for condition and setting          Labs on Admission:  Basic Metabolic Panel: Recent Labs  Lab 08/28/24 1948  NA 139  K 4.2  CL 102  CO2 27  GLUCOSE 161*  BUN 15  CREATININE 0.81  CALCIUM  8.9   Liver Function Tests: Recent Labs  Lab 08/28/24 1948  AST 26  ALT 29  ALKPHOS 79  BILITOT 0.2  PROT 6.8  ALBUMIN  4.3   No results for input(s): LIPASE, AMYLASE in the last 168 hours. No results for input(s): AMMONIA in the last 168 hours. CBC: Recent Labs  Lab 08/28/24 1948  WBC 9.5  HGB 14.3  HCT 42.3  MCV 82.8  PLT 192   Cardiac Enzymes: No results for input(s): CKTOTAL, CKMB, CKMBINDEX, TROPONINI in the last 168 hours.  BNP (last 3 results) No results for input(s): BNP in the last 8760 hours.  ProBNP (last 3 results) Recent Labs    08/28/24 1948  PROBNP 378.0*    CBG: No results for input(s): GLUCAP in the last 168 hours.  Radiological Exams on Admission: [facility] Chest [facility]  1 View Result Date: 08/28/2024 EXAM: 1 VIEW(S) XRAY OF THE CHEST 08/28/2024 07:43:00 PM COMPARISON: 06/17/2023 CLINICAL HISTORY: SOB (shortness of breath) FINDINGS: LUNGS AND PLEURA: Mild interstitial opacities. No pleural effusion. No pneumothorax. HEART AND MEDIASTINUM: Cardiomegaly. BONES AND SOFT TISSUES: No acute osseous abnormality. IMPRESSION: 1. Mild interstitial opacities, likely mild edema . 2. Cardiomegaly. Electronically signed by: Greig Pique MD 08/28/2024 08:58 PM EST RP Workstation: [id number]    EKG: I independently viewed the EKG done and  my findings are as followed: A-fib 103.  QTc 425.  Assessment/Plan Present on Admission:  Dyspnea  Principal Problem:   Dyspnea  Dyspnea likely secondary to mild pulmonary edema versus A-fib with RVR Diuresis as blood pressure allows Rate is currently controlled Resume home rate control agents Resume home Eliquis  for CVA prevention Monitor on telemetry  Permanent atrial fibrillation with RVR Currently rate controlled Resume home rate control agents Resume home Eliquis   Presumed influenza A viral infection Direct exposure with his wife Wife admitted to the hospital for influenza A viral infection. High risk for complication in the setting of HFpEF, COPD, type 2 diabetes, obesity.  Hyperlipidemia Resume home statin and Zetia   Hyperthyroidism Resume home methimazole   Chronic HFpEF Mild proBNP less than 400, mild pulmonary edema on chest x-ray IV Lasix  20 mg twice daily x 2  Obesity BMI 32 Recommend weight loss outpatient with regular physical activity and healthy dieting.  Type 2 diabetes with hyperglycemia Last hemoglobin A1c 7.5 on 03/14/2024. Heart healthy, carb modified diet Insulin  coverage.  Mood disorder Restless leg syndrome Resume home regimen.   Time: 75 minutes.   DVT prophylaxis: On Eliquis  twice daily.  Code Status: Full code.  Family Communication: None at bedside.  Disposition Plan: Admitted to telemetry unit.  Consults called: None.  Admission status: Observation status.   Status is: Observation    Terry LOISE Hurst MD [facility] Pager [phone]  If 7PM-7AM, please contact night-coverage [email or web address] Password [email or web address]  08/28/2024, 10:57 PM        [1] No Known Allergies "

## 2024-08-28 NOTE — ED Triage Notes (Signed)
 Pt to the ED with cough and shortness of breath that began at 1400 today.  The pt brought his wife in this morning and she was admitted flu positive.

## 2024-08-28 NOTE — ED Provider Notes (Signed)
 " [place] EMERGENCY DEPARTMENT AT [facility] Provider Note   CSN: [id number] Arrival date & time: 08/28/24  1916     Patient presents with: Shortness of Breath   Shaun [name] is a 68 y.o. male.  {Add pertinent medical, surgical, social history, OB history to YEP:67052} Patient has a history of coronary artery disease and atrial fibs heart failure.  Patient complains of shortness of breath.   Shortness of Breath      Prior to Admission medications  Medication Sig Start Date End Date Taking? Authorizing Provider  albuterol  (PROVENTIL ) (2.5 MG/3ML) 0.083% nebulizer solution Take 3 mLs (2.5 mg total) by nebulization every 6 (six) hours as needed for wheezing or shortness of breath. 06/17/23   Dreama Longs, MD  albuterol  (VENTOLIN  HFA) 108 (90 Base) MCG/ACT inhaler INHALE 2 PUFFS INTO THE LUNGS EVERY 6 HOURS AS NEEDED FOR WHEEZING OR SHORTNESS OF BREATH 01/12/23   Tysinger, Alm RAMAN, PA-C  apixaban  (ELIQUIS ) 5 MG TABS tablet Take 1 tablet (5 mg total) by mouth 2 (two) times daily. 10/20/23   Pietro Redell RAMAN, MD  ascorbic acid  (VITAMIN C ) 500 MG tablet Take 500 mg by mouth daily.    [provider]  atorvastatin  (LIPITOR ) 80 MG tablet Take 1 tablet (80 mg total) by mouth every evening. 03/14/24   Meng, Hao, PA  buPROPion  (WELLBUTRIN  XL) 150 MG 24 hr tablet Take 3 tablets (450 mg total) by mouth daily. 10/23/23   Early, Sara E, NP  docusate sodium  (COLACE) 100 MG capsule Take 1 capsule (100 mg total) by mouth daily as needed for mild constipation. 11/29/23   Early, Sara E, NP  empagliflozin  (JARDIANCE ) 10 MG TABS tablet Take 10 mg by mouth daily. Through [facility] P[facility]    [provider]  escitalopram  (LEXAPRO ) 20 MG tablet Take 20 mg by mouth in the morning.    [provider]  ezetimibe  (ZETIA ) 10 MG tablet Take 1 tablet (10 mg total) by mouth daily. 09/07/23 09/06/24  Pietro Redell RAMAN, MD  ferrous sulfate  (FEROSUL) 325 (65 FE) MG tablet Take 1 tablet  (325 mg total) by mouth 3 (three) times a week. 07/22/24   Early, Sara E, NP  furosemide  (LASIX ) 40 MG tablet Take 1 tablet (40 mg total) by mouth daily. May also take additional ONE-HALF tablet (20 mg total) as needed if shortness of breath or swelling occurs. 07/22/24   Early, Sara E, NP  HYDROcodone -acetaminophen  (NORCO/VICODIN) 5-325 MG tablet Take 1-2 tablets by mouth every 12 (twelve) hours as needed for moderate pain (pain score 4-6) or severe pain (pain score 7-10). 07/09/24   Early, Sara E, NP  hydrOXYzine  (ATARAX ) 25 MG tablet Take 1 tablet (25 mg total) by mouth at bedtime and may repeat dose one time if needed for sleep. 04/08/24   Early, Sara E, NP  metFORMIN  (GLUCOPHAGE ) 500 MG tablet Take 1 tablet (500 mg total) by mouth 2 (two) times daily before a meal. 08/12/24   Early, Camie BRAVO, NP  methimazole  (TAPAZOLE ) 5 MG tablet Take 1 tablet (5 mg total) by mouth as directed. 1 tablet 4 days a week 03/25/24   [name], Ibtehal Jaralla, MD  metoprolol  tartrate (LOPRESSOR ) 100 MG tablet Take 1 tablet (100 mg total) by mouth 2 (two) times daily. 09/15/23 09/09/24  Meng, Hao, PA  nitroGLYCERIN  (NITROSTAT ) 0.4 MG SL tablet Place 1 tablet (0.4 mg total) under the tongue every 5 (five) minutes as needed. 07/29/22   Early, Sara E, NP  pantoprazole  (PROTONIX ) 40 MG tablet Take 1 tablet (40 mg total) by mouth daily before breakfast. 09/12/23   Ezzard Sonny RAMAN, PA-C  polyethylene glycol powder (GLYCOLAX /MIRALAX ) 17 GM/SCOOP powder Take one capful (17g) twice daily until soft stool, then continue one capful once daily as needed. 09/12/23   Ezzard Sonny RAMAN, PA-C  potassium chloride  SA (KLOR-CON  M) 20 MEQ tablet Take 1 tablet (20 mEq total) by mouth daily. 04/24/24   Pietro Redell RAMAN, MD  rOPINIRole  (REQUIP ) 3 MG tablet Take 1 tablet (3 mg total) by mouth at bedtime, AND 0.5 tablets (1.5 mg total) during the day for breakthrough symptoms. 08/19/24   Early, Sara E, NP  sildenafil  (VIAGRA ) 100 MG tablet Take 1 tablet  (100 mg total) by mouth as needed for erectile dysfunction. Do not take nitroglycerin  within 24 hours of last dose. 02/01/24   Early, Sara E, NP  Tdap (BOOSTRIX ) 5-2.5-18.5 LF-MCG/0.5 injection Inject into the muscle. 06/14/24   Luiz Channel, MD  tiZANidine  (ZANAFLEX ) 4 MG tablet Take 1 tablet (4 mg total) by mouth every 12 (twelve) hours as needed for muscle spasms. Only take if pain is not under a 5 with other medications. 08/26/24   Early, Sara E, NP  traZODone  (DESYREL ) 150 MG tablet Take 1 tablet (150 mg total) by mouth at bedtime. 05/03/24   Early, Sara E, NP  Vitamin D , Ergocalciferol , (DRISDOL ) 1.25 MG (50000 UNIT) CAPS capsule Take 1 capsule (50,000 Units total) by mouth every 7 (seven) days. 12/08/23   Early, Sara E, NP    Allergies: Patient has no known allergies.    Review of Systems  Respiratory:  Positive for shortness of breath.     Updated Vital Signs BP (!) 148/94   Pulse 90   Temp 99 F (37.2 C) (Oral)   Resp 17   Ht 5' 8 (1.727 m)   Wt 98.4 kg   SpO2 91%   BMI 32.99 kg/m   Physical Exam  (all labs ordered are listed, but only abnormal results are displayed) Labs Reviewed  BASIC METABOLIC PANEL WITH GFR - Abnormal; Notable for the following components:      Result Value   Glucose, Bld 161 (*)    All other components within normal limits  HEPATIC FUNCTION PANEL - Abnormal; Notable for the following components:   Indirect Bilirubin 0.1 (*)    All other components within normal limits  PRO BRAIN NATRIURETIC PEPTIDE - Abnormal; Notable for the following components:   Pro Brain Natriuretic Peptide 378.0 (*)    All other components within normal limits  RESP PANEL BY RT-PCR (RSV, FLU A&B, COVID)  RVPGX2  CBC    EKG: None  Radiology: DG Chest Port 1 View Result Date: 08/28/2024 EXAM: 1 VIEW(S) XRAY OF THE CHEST 08/28/2024 07:43:00 PM COMPARISON: 06/17/2023 CLINICAL HISTORY: SOB (shortness of breath) FINDINGS: LUNGS AND PLEURA: Mild interstitial opacities. No  pleural effusion. No pneumothorax. HEART AND MEDIASTINUM: Cardiomegaly. BONES AND SOFT TISSUES: No acute osseous abnormality. IMPRESSION: 1. Mild interstitial opacities, likely mild edema . 2. Cardiomegaly. Electronically signed by: Greig Pique MD 08/28/2024 08:58 PM EST RP Workstation: [id number]    {Document cardiac monitor, telemetry assessment procedure when appropriate:32947} Procedures   Medications Ordered in the ED  metoprolol  tartrate (LOPRESSOR ) injection 5 mg (0 mg Intravenous Hold 08/28/24 2100)  metoprolol  tartrate (LOPRESSOR ) tablet 100 mg (has no administration in time range)  magnesium  sulfate IVPB 2 g 50 mL (0 g Intravenous Stopped 08/28/24 2132)  methylPREDNISolone  sodium succinate (SOLU-MEDROL )  125 mg/2 mL injection 125 mg (125 mg Intravenous Given 08/28/24 2002)  ipratropium-albuterol  (DUONEB) 0.5-2.5 (3) MG/3ML nebulizer solution 3 mL (3 mLs Nebulization Given 08/28/24 2006)  metoprolol  tartrate (LOPRESSOR ) injection 5 mg (5 mg Intravenous Given 08/28/24 2236)  rOPINIRole  (REQUIP ) tablet 1 mg (1 mg Oral Given 08/28/24 2251)     CRITICAL CARE Performed by: Fairy Sermon Total critical care time: 45 minutes Critical care time was exclusive of separately billable procedures and treating other patients. Critical care was necessary to treat or prevent imminent or life-threatening deterioration. Critical care was time spent personally by me on the following activities: development of treatment plan with patient and/or surrogate as well as nursing, discussions with consultants, evaluation of patient's response to treatment, examination of patient, obtaining history from patient or surrogate, ordering and performing treatments and interventions, ordering and review of laboratory studies, ordering and review of radiographic studies, pulse oximetry and re-evaluation of patient's condition.  {Click here for ABCD2, HEART and other calculators REFRESH Note before signing:1}                               Medical Decision Making Amount and/or Complexity of Data Reviewed Labs: ordered.  Risk Prescription drug management. Decision regarding hospitalization.   Patient with rapid atrial fibs and congestive heart failure.  He will be admitted to medicine  {Document critical care time when appropriate  Document review of labs and clinical decision tools ie [id number], etc  Document your independent review of radiology images and any outside records  Document your discussion with family members, caretakers and with consultants  Document social determinants of health affecting pt's care  Document your decision making why or why not admission, treatments were needed:32947:::1}   Final diagnoses:  Systolic congestive heart failure, unspecified HF chronicity (HCC)  Atrial fibrillation with [facility])    ED Discharge Orders     None        "

## 2024-08-28 NOTE — Telephone Encounter (Signed)
 FYI Only or Action Required?: FYI only for provider: UC advised.  Patient was last seen in primary care on 05/10/2024 by Shaun [name], Shaun BRAVO, NP.  Called Nurse Triage reporting Cough.  Symptoms began today.  Interventions attempted: Nothing.  Symptoms are: stable.  Triage Disposition: Home Care  Patient/caregiver understands and will follow disposition?: Yes Reason for Disposition  Cough  Answer Assessment - Initial Assessment Questions Patient's daughter Shaun [name] calling in on behalf of patient today, patient's wife is admitted with Flu B. She feels like tomorrow is going to be worse and wants to get it taken care of before his symptoms worsen. Advised Shaun [name] or being seen at hospital as offices are closed.  1. ONSET: When did the cough begin?      This morning  2. SPUTUM: Describe the color of your sputum (e.g., none, dry cough; clear, white, yellow, green)     Dry cough  3. HEMOPTYSIS: Are you coughing up any blood? If Yes, ask: How much? (e.g., flecks, streaks, tablespoons, etc.)     Denies  4. DIFFICULTY BREATHING: Are you having difficulty breathing? If Yes, ask: How bad is it? (e.g., mild, moderate, severe)      Denies  5. FEVER: Do you have a fever? If Yes, ask: What is your temperature, how was it measured, and when did it start?     Unsure  6. CARDIAC HISTORY: Do you have any history of heart disease? (e.g., heart attack, congestive heart failure)      Afib, hypertension  7. LUNG HISTORY: Do you have any history of lung disease?  (e.g., pulmonary embolus, asthma, emphysema)     Asthma, COPD  8. PE RISK FACTORS: Do you have a history of blood clots? (or: recent major surgery, recent prolonged travel, bedridden)     Denies  9. OTHER SYMPTOMS: Do you have any other symptoms? (e.g., runny nose, wheezing, chest pain)       Fatigue  Protocols used: Cough - Acute Non-Productive-A-AH  Copied from CRM #[id number]. Topic: Clinical - Red Word Triage >> Aug 28, 2024  4:13 PM Shaun [name] Shaun [name] wrote: Red Word that prompted transfer to Nurse Triage: Patient's daughter, Shaun [name], stated patient is not feeling well. His wife has the flu  Cough, body aches

## 2024-08-29 ENCOUNTER — Encounter (HOSPITAL_COMMUNITY): Payer: Self-pay | Admitting: Internal Medicine

## 2024-08-29 ENCOUNTER — Other Ambulatory Visit (HOSPITAL_BASED_OUTPATIENT_CLINIC_OR_DEPARTMENT_OTHER): Payer: Self-pay

## 2024-08-29 DIAGNOSIS — R0600 Dyspnea, unspecified: Secondary | ICD-10-CM | POA: Diagnosis not present

## 2024-08-29 LAB — GLUCOSE, CAPILLARY
Glucose-Capillary: 269 mg/dL — ABNORMAL HIGH (ref 70–99)
Glucose-Capillary: 266 mg/dL — ABNORMAL HIGH (ref 70–99)
Glucose-Capillary: 132 mg/dL — ABNORMAL HIGH (ref 70–99)

## 2024-08-29 LAB — CBC
HCT: 41.3 % (ref 39.0–52.0)
Hemoglobin: 14 g/dL (ref 13.0–17.0)
MCH: 28 pg (ref 26.0–34.0)
MCHC: 33.9 g/dL (ref 30.0–36.0)
MCV: 82.6 fL (ref 80.0–100.0)
Platelets: 166 K/uL (ref 150–400)
RBC: 5 MIL/uL (ref 4.22–5.81)
RDW: 13.8 % (ref 11.5–15.5)
WBC: 8.3 K/uL (ref 4.0–10.5)
nRBC: 0 % (ref 0.0–0.2)

## 2024-08-29 LAB — BASIC METABOLIC PANEL WITH GFR
Anion gap: 11 (ref 5–15)
BUN: 17 mg/dL (ref 8–23)
CO2: 25 mmol/L (ref 22–32)
Calcium: 8.8 mg/dL — ABNORMAL LOW (ref 8.9–10.3)
Chloride: 101 mmol/L (ref 98–111)
Creatinine, Ser: 0.84 mg/dL (ref 0.61–1.24)
GFR, Estimated: >60 mL/min
Glucose, Bld: 296 mg/dL — ABNORMAL HIGH (ref 70–99)
Potassium: 4.3 mmol/L (ref 3.5–5.1)
Sodium: 137 mmol/L (ref 135–145)

## 2024-08-29 LAB — MAGNESIUM: Magnesium: 2.4 mg/dL (ref 1.7–2.4)

## 2024-08-29 LAB — PHOSPHORUS: Phosphorus: 2.9 mg/dL (ref 2.5–4.6)

## 2024-08-29 LAB — HIV ANTIBODY (ROUTINE TESTING W REFLEX): HIV Screen 4th Generation wRfx: NONREACTIVE

## 2024-08-29 MED ORDER — OSELTAMIVIR PHOSPHATE 75 MG PO CAPS
75.0000 mg | ORAL_CAPSULE | Freq: Two times a day (BID) | ORAL | Status: DC
  Administered 2024-08-29 (×2): 75 mg via ORAL
  Filled 2024-08-29 (×2): qty 1

## 2024-08-29 MED ORDER — ROPINIROLE HCL 1 MG PO TABS: 1.5000 mg | ORAL_TABLET | Freq: Every day | ORAL | Status: DC

## 2024-08-29 MED ORDER — ROPINIROLE HCL 1 MG PO TABS: 3.0000 mg | ORAL_TABLET | Freq: Every day | ORAL | Status: DC

## 2024-08-29 MED ORDER — OSELTAMIVIR PHOSPHATE 75 MG PO CAPS
75.0000 mg | ORAL_CAPSULE | Freq: Two times a day (BID) | ORAL | 0 refills | Status: AC
  Filled 2024-08-29: qty 7, 4d supply, fill #0

## 2024-08-29 MED ORDER — PANTOPRAZOLE SODIUM 40 MG PO TBEC
40.0000 mg | DELAYED_RELEASE_TABLET | Freq: Every day | ORAL | 3 refills | Status: AC
  Filled 2024-08-29: qty 90, 90d supply, fill #0

## 2024-08-29 MED ORDER — OSELTAMIVIR PHOSPHATE 75 MG PO CAPS
75.0000 mg | ORAL_CAPSULE | Freq: Two times a day (BID) | ORAL | Status: DC
  Administered 2024-08-29: 75 mg via ORAL
  Filled 2024-08-29: qty 1

## 2024-08-29 NOTE — Plan of Care (Signed)
   Problem: Education: Goal: Ability to describe self-care measures that may prevent or decrease complications (Diabetes Survival Skills Education) will improve Outcome: Progressing Goal: Individualized Educational Video(s) Outcome: Progressing   Problem: Fluid Volume: Goal: Ability to maintain a balanced intake and output will improve Outcome: Progressing

## 2024-08-29 NOTE — Discharge Summary (Signed)
 " Physician Discharge Summary   Patient: Shaun [name] MRN: [id number] DOB: 09-16-1955  Admit date:     08/28/2024  Discharge date: 08/29/2024  Discharge Physician: Bernardino KATHEE Come   PCP: Oris Camie BRAVO, NP   Recommendations at discharge:    Discharge Diagnoses: Principal Problem:   Dyspnea  Hospital Course: HPI: Shaun [name] is a 69 y.o. male with medical history significant for hypothyroidism, mood disorder, type 2 diabetes, hypertension, hyperlipidemia, chronic HFpEF, COPD, restless leg syndrome, who presents to the ER with complaints of shortness of breath that started this afternoon.  Admits to exposure to influenza A by his wife who was admitted.   In the ER, tachypneic, tachycardic in A-fib with RVR with rates in the 130s.  proBNP mildly elevated 378.  Mild pulmonary edema on chest x-ray.  Influenza A and B, COVID-19, RSV, all negative.   The patient received IV Lopressor  5 mg x 2, p.o. Lopressor  100 mg x 1, IV Solu-Medrol  125 mg x 1, IV magnesium  2 g x 1, DuoNebs x 1 with some improvement.  TRH, hospitalist service, was asked to admit for further management of dyspnea.   ED Course: Temperature 99.  BP 146/97, pulse 104, respiration rate 24, O2 saturation 93% on room air.  Hospital Course: Pt admitted he had not been taking any medications including metoprolol  for several days prior to presentation as he was too busy taking care of his wife. When home medications were restarted and with the tamiflu  initiated, his clinical status improved significantly. He was also given IV lasix  but will be able to continue his usual oral lasix  after discharge, appearing roughly euvolemic on 08/29/2024 prior to discharge.   Assessment and Plan: Dyspnea likely secondary to mild pulmonary edema versus A-fib with RVR: Likely related to patient not taking beta blocker or diuretic for several days PTA. Diuresed well with IV lasix , 98.4kg > 97.3kg, exam suggests euvolemia, I/O incomplete. Rate  controlled since restarting metoprolol .  - Continue home medications (stressed adherence) including metoprolol , eliquis , lasix .      Presumed influenza A viral infection: Given his symptoms, strongly suspect he has early influenza (false negative PCR at admission) given very close contact to spouse with it and local outbreak. Note he required mechanical ventilation in the past when he got the flu. Therefore, we are treating with tamiflu  75mg  BID x5 days. Droplet precautions.     Hyperlipidemia Resume home statin and Zetia    Hyperthyroidism Resume home methimazole    Chronic HFpEF Mild proBNP less than 400, mild pulmonary edema on chest x-ray IV Lasix  20 mg twice daily x 2 while admitted, continue home Tx after discharge.   Obesity BMI 32 Recommend weight loss outpatient with regular physical activity and healthy dieting.   Type 2 diabetes with hyperglycemia Last hemoglobin A1c 7.5 on 03/14/2024. - Continue home Tx   Mood disorder Restless leg syndrome Resume home regimen.  Consultants: None Procedures performed: None  Disposition: Home Diet recommendation:  Cardiac and Carb modified diet DISCHARGE MEDICATION: Allergies as of 08/29/2024   No Known Allergies      Medication List     STOP taking these medications    Boostrix  5-2.5-18.5 LF-MCG/0.5 injection Generic drug: Tdap       TAKE these medications    rOPINIRole  3 MG tablet Commonly known as: REQUIP  Take 1 tablet (3 mg total) by mouth at bedtime, AND 0.5 tablets (1.5 mg total) during the day for breakthrough symptoms. The timing of this medication is very  important.   albuterol  108 (90 Base) MCG/ACT inhaler Commonly known as: Ventolin  HFA INHALE 2 PUFFS INTO THE LUNGS EVERY 6 HOURS AS NEEDED FOR WHEEZING OR SHORTNESS OF BREATH   albuterol  (2.5 MG/3ML) 0.083% nebulizer solution Commonly known as: PROVENTIL  Take 3 mLs (2.5 mg total) by nebulization every 6 (six) hours as needed for wheezing or shortness of  breath.   ascorbic acid  500 MG tablet Commonly known as: VITAMIN C  Take 500 mg by mouth daily.   atorvastatin  80 MG tablet Commonly known as: LIPITOR  Take 1 tablet (80 mg total) by mouth once daily in the evening.  Please follow up in 2-3 months. (Take 1 tablet (80 mg total) by mouth every evening.)   buPROPion  150 MG 24 hr tablet Commonly known as: Wellbutrin  XL Take 3 tablets by mouth once daily. (Take 3 tablets (450 mg total) by mouth daily.)   docusate sodium  100 MG capsule Commonly known as: COLACE Take 1 capsule (100 mg total) by mouth daily as needed for mild constipation.   Eliquis  5 MG Tabs tablet Generic drug: apixaban  Take 1 tablet (5 mg total) by mouth 2 (two) times daily.   escitalopram  20 MG tablet Commonly known as: LEXAPRO  Take 20 mg by mouth in the morning.   ezetimibe  10 MG tablet Commonly known as: ZETIA  Take 1 tablet (10 mg total) by mouth daily.   FeroSul 325 (65 Fe) MG tablet Generic drug: ferrous sulfate  Take 1 tablet (325 mg total) by mouth 3 (three) times a week.   furosemide  40 MG tablet Commonly known as: LASIX  Take 1 tablet (40 mg total) by mouth daily. May also take additional ONE-HALF tablet (20 mg total) as needed if shortness of breath or swelling occurs.   HYDROcodone -acetaminophen  5-325 MG tablet Commonly known as: NORCO/VICODIN Take 1-2 tablets by mouth every 12 (twelve) hours as needed for moderate pain (pain score 4-6) or severe pain (pain score 7-10).   hydrOXYzine  25 MG tablet Commonly known as: ATARAX  Take 1 tablet (25 mg total) by mouth at bedtime and may repeat dose one time if needed for sleep.   Jardiance  10 MG Tabs tablet Generic drug: empagliflozin  Take 10 mg by mouth daily. Through [employer] PAP   metFORMIN  500 MG tablet Commonly known as: GLUCOPHAGE  Take 1 tablet (500 mg total) by mouth 2 (two) times daily before a meal.   methimazole  5 MG tablet Commonly known as: TAPAZOLE  Take 1 tablet (5 mg total) by mouth as  directed. 1 tablet 4 days a week   metoprolol  tartrate 100 MG tablet Commonly known as: LOPRESSOR  Take 1 tablet (100 mg total) by mouth 2 (two) times daily.   nitroGLYCERIN  0.4 MG SL tablet Commonly known as: NITROSTAT  Place 1 tablet (0.4 mg total) under the tongue every 5 (five) minutes as needed.   oseltamivir  75 MG capsule Commonly known as: TAMIFLU  Take 1 capsule (75 mg total) by mouth 2 (two) times daily. Start taking on: August 30, 2024   pantoprazole  40 MG tablet Commonly known as: PROTONIX  Take 1 tablet (40 mg total) by mouth daily before breakfast.   PEG 3350  17 GM/SCOOP Powd Take one capful (17g) twice daily until soft stool, then continue one capful once daily as needed.   potassium chloride  SA 20 MEQ tablet Commonly known as: KLOR-CON  M Take 1 tablet (20 mEq total) by mouth daily.   sildenafil  100 MG tablet Commonly known as: VIAGRA  Take 1 tablet (100 mg total) by mouth as needed for erectile dysfunction. Do  not take nitroglycerin  within 24 hours of last dose.   tiZANidine  4 MG tablet Commonly known as: ZANAFLEX  Take 1 tablet (4 mg total) by mouth every 12 (twelve) hours as needed for muscle spasms. Only take if pain is not under a 5 with other medications.   traZODone  150 MG tablet Commonly known as: DESYREL  Take 1 tablet (150 mg total) by mouth at bedtime.   Vitamin D  (Ergocalciferol ) 1.25 MG (50000 UNIT) Caps capsule Commonly known as: DRISDOL  Take 1 capsule (50,000 Units total) by mouth every 7 (seven) days.        Follow-up Information     Early, Camie BRAVO, NP Follow up.   Specialty: Nurse Practitioner Contact information: [address] [place] [address] [phone]         Pietro Redell RAMAN, MD Follow up.   Specialty: Cardiology               Discharge Exam: Filed Weights   08/28/24 1922 08/29/24 0435  Weight: 98.4 kg 97.3 kg  BP 126/72 (BP Location: Left Arm)   Pulse 76   Temp 98.3 F (36.8 C) (Oral)   Resp 18    Ht 5' 8 (1.727 m)   Wt 97.3 kg   SpO2 95%   BMI 32.60 kg/m   Irreg, rate in 70's, no MRG, trace LE edema Clear, nonlabored WDWN  Condition at discharge: stable  The results of significant diagnostics from this hospitalization (including imaging, microbiology, ancillary and laboratory) are listed below for reference.   Imaging Studies: DG Chest Port 1 View Result Date: 08/28/2024 EXAM: 1 VIEW(S) XRAY OF THE CHEST 08/28/2024 07:43:00 PM COMPARISON: 06/17/2023 CLINICAL HISTORY: SOB (shortness of breath) FINDINGS: LUNGS AND PLEURA: Mild interstitial opacities. No pleural effusion. No pneumothorax. HEART AND MEDIASTINUM: Cardiomegaly. BONES AND SOFT TISSUES: No acute osseous abnormality. IMPRESSION: 1. Mild interstitial opacities, likely mild edema . 2. Cardiomegaly. Electronically signed by: Greig Pique MD 08/28/2024 08:58 PM EST RP Workstation: [id number]    Microbiology: Results for orders placed or performed during the hospital encounter of 08/28/24  Resp panel by RT-PCR (RSV, Flu A&B, Covid) Anterior Nasal Swab     Status: None   Collection Time: 08/28/24  7:48 PM   Specimen: Anterior Nasal Swab  Result Value Ref Range Status   SARS Coronavirus 2 by RT PCR NEGATIVE NEGATIVE Final    Comment: (NOTE) SARS-CoV-2 target nucleic acids are NOT DETECTED.  The SARS-CoV-2 RNA is generally detectable in upper respiratory specimens during the acute phase of infection. The lowest concentration of SARS-CoV-2 viral copies this assay can detect is 138 copies/mL. A negative result does not preclude SARS-Cov-2 infection and should not be used as the sole basis for treatment or other patient management decisions. A negative result may occur with  improper specimen collection/handling, submission of specimen other than nasopharyngeal swab, presence of viral mutation(s) within the areas targeted by this assay, and inadequate number of viral copies(<138 copies/mL). A negative result must be  combined with clinical observations, patient history, and epidemiological information. The expected result is Negative.  Fact Sheet for Patients:  bloggercourse.com  Fact Sheet for Healthcare Providers:  seriousbroker.it  This test is no t yet approved or cleared by the [place]  FDA and  has been authorized for detection and/or diagnosis of SARS-CoV-2 by FDA under an Emergency Use Authorization (EUA). This EUA will remain  in effect (meaning this test can be used) for the duration of the COVID-19 declaration under Section 564(b)(1) of the Act,  21 U.S.C.section 360bbb-3(b)(1), unless the authorization is terminated  or revoked sooner.       Influenza A by PCR NEGATIVE NEGATIVE Final   Influenza B by PCR NEGATIVE NEGATIVE Final    Comment: (NOTE) The Xpert Xpress SARS-CoV-2/FLU/RSV plus assay is intended as an aid in the diagnosis of influenza from Nasopharyngeal swab specimens and should not be used as a sole basis for treatment. Nasal washings and aspirates are unacceptable for Xpert Xpress SARS-CoV-2/FLU/RSV testing.  Fact Sheet for Patients: bloggercourse.com  Fact Sheet for Healthcare Providers: seriousbroker.it  This test is not yet approved or cleared by the [place]  FDA and has been authorized for detection and/or diagnosis of SARS-CoV-2 by FDA under an Emergency Use Authorization (EUA). This EUA will remain in effect (meaning this test can be used) for the duration of the COVID-19 declaration under Section 564(b)(1) of the Act, 21 U.S.C. section 360bbb-3(b)(1), unless the authorization is terminated or revoked.     Resp Syncytial Virus by PCR NEGATIVE NEGATIVE Final    Comment: (NOTE) Fact Sheet for Patients: bloggercourse.com  Fact Sheet for Healthcare Providers: seriousbroker.it  This test is not yet  approved or cleared by the [place]  FDA and has been authorized for detection and/or diagnosis of SARS-CoV-2 by FDA under an Emergency Use Authorization (EUA). This EUA will remain in effect (meaning this test can be used) for the duration of the COVID-19 declaration under Section 564(b)(1) of the Act, 21 U.S.C. section 360bbb-3(b)(1), unless the authorization is terminated or revoked.  Performed at [facility], [address], [place], [place] [address]    *Note: Due to a large number of results and/or encounters for the requested time period, some results have not been displayed. A complete set of results can be found in Results Review.    Labs: CBC: Recent Labs  Lab 08/28/24 1948 08/29/24 0541  WBC 9.5 8.3  HGB 14.3 14.0  HCT 42.3 41.3  MCV 82.8 82.6  PLT 192 166   Basic Metabolic Panel: Recent Labs  Lab 08/28/24 1948 08/29/24 0541  NA 139 137  K 4.2 4.3  CL 102 101  CO2 27 25  GLUCOSE 161* 296*  BUN 15 17  CREATININE 0.81 0.84  CALCIUM  8.9 8.8*  MG  --  2.4  PHOS  --  2.9   Liver Function Tests: Recent Labs  Lab 08/28/24 1948  AST 26  ALT 29  ALKPHOS 79  BILITOT 0.2  PROT 6.8  ALBUMIN  4.3   CBG: Recent Labs  Lab 08/28/24 2321 08/29/24 0728 08/29/24 1128 08/29/24 1626  GLUCAP 307* 269* 266* 132*    Discharge time spent: greater than 30 minutes.  Signed: Bernardino KATHEE Come, MD [facility] 08/29/2024 "

## 2024-08-29 NOTE — Progress Notes (Signed)
 Patient admitted with dyspnea, diuresing, from home, medical work up continues.     08/29/24 1249  TOC Brief Assessment  Insurance and Status Reviewed  Patient has primary care physician Yes  Home environment has been reviewed Home with spouse  Prior level of function: independent  Prior/Current Home Services No current home services  Social Drivers of Health Review SDOH reviewed no interventions necessary  Readmission risk has been reviewed Yes  Transition of care needs no transition of care needs at this time   Inpatient Care Manager (ICM) has reviewed patient and no ICM needs have been identified at this time. We will continue to monitor patient advancement through interdisciplinary progression rounds. If new patient transition needs arise, please place a ICM consult.

## 2024-08-29 NOTE — Progress Notes (Signed)
 Patient has discharge orders, discharge teaching given and no further questions at this time.

## 2024-08-29 NOTE — Progress Notes (Signed)
" °   08/29/24 0107  BiPAP/CPAP/SIPAP  $ Non-Invasive Home Ventilator  Initial  $ Face Mask Medium Yes  BiPAP/CPAP/SIPAP Pt Type Adult  BiPAP/CPAP/SIPAP DREAMSTATIOND  Mask Type Full face mask  Dentures removed? Not applicable  Mask Size Medium  FiO2 (%) 21 %  Patient Home Machine No  Patient Home Mask No  Patient Home Tubing No  Auto Titrate Yes  Minimum cmH2O 5 cmH2O  Maximum cmH2O 20 cmH2O  Device Plugged into RED Power Outlet (S)   (no red outlet available on this patient unit)   Patient placed on CPAP for the night. "

## 2024-08-30 ENCOUNTER — Other Ambulatory Visit: Payer: Self-pay

## 2024-08-30 ENCOUNTER — Other Ambulatory Visit (HOSPITAL_BASED_OUTPATIENT_CLINIC_OR_DEPARTMENT_OTHER): Payer: Self-pay

## 2024-08-30 DIAGNOSIS — R251 Tremor, unspecified: Secondary | ICD-10-CM

## 2024-08-30 MED ORDER — METOPROLOL TARTRATE 100 MG PO TABS
100.0000 mg | ORAL_TABLET | Freq: Two times a day (BID) | ORAL | 0 refills | Status: DC
  Filled 2024-08-30: qty 60, 30d supply, fill #0

## 2024-09-04 ENCOUNTER — Ambulatory Visit: Admitting: Neurology

## 2024-09-05 ENCOUNTER — Telehealth: Payer: Self-pay

## 2024-09-05 NOTE — Progress Notes (Signed)
" ° °  09/05/2024  Patient ID: Shaun [name], male   DOB: 18-Mar-1956, 69 y.o.   MRN: [id number]  Contacted [employer] to follow up on patient's Jardiance  renewal application. Patient has been APPROVED through 08/28/25.  Jon VEAR Lindau, PharmD Clinical Pharmacist [phone]  "

## 2024-09-06 ENCOUNTER — Other Ambulatory Visit (HOSPITAL_BASED_OUTPATIENT_CLINIC_OR_DEPARTMENT_OTHER): Payer: Self-pay

## 2024-09-06 ENCOUNTER — Other Ambulatory Visit: Payer: Self-pay

## 2024-09-06 ENCOUNTER — Other Ambulatory Visit: Payer: Self-pay | Admitting: Nurse Practitioner

## 2024-09-06 DIAGNOSIS — M2550 Pain in unspecified joint: Secondary | ICD-10-CM

## 2024-09-06 DIAGNOSIS — G894 Chronic pain syndrome: Secondary | ICD-10-CM

## 2024-09-06 MED ORDER — HYDROCODONE-ACETAMINOPHEN 5-325 MG PO TABS
1.0000 | ORAL_TABLET | Freq: Two times a day (BID) | ORAL | 0 refills | Status: AC | PRN
  Filled 2024-09-06: qty 120, 30d supply, fill #0

## 2024-09-06 NOTE — Telephone Encounter (Signed)
 Last appt. 05/10/24

## 2024-09-09 ENCOUNTER — Other Ambulatory Visit (HOSPITAL_BASED_OUTPATIENT_CLINIC_OR_DEPARTMENT_OTHER): Payer: Self-pay

## 2024-09-17 ENCOUNTER — Encounter (HOSPITAL_COMMUNITY): Payer: Self-pay

## 2024-09-17 ENCOUNTER — Encounter (HOSPITAL_COMMUNITY)
Admission: RE | Admit: 2024-09-17 | Discharge: 2024-09-17 | Disposition: A | Discharge status: Home / Self Care | Source: Ambulatory Visit | Attending: Neurology | Admitting: Neurology

## 2024-09-17 ENCOUNTER — Ambulatory Visit: Payer: Self-pay | Admitting: Neurology

## 2024-09-17 DIAGNOSIS — R251 Tremor, unspecified: Secondary | ICD-10-CM | POA: Insufficient documentation

## 2024-09-17 MED ORDER — IOFLUPANE I 123 185 MBQ/2.5ML IV SOLN
5.0000 | Freq: Once | INTRAVENOUS | Status: AC
  Administered 2024-09-17: 4.4 via INTRAVENOUS

## 2024-09-23 ENCOUNTER — Telehealth: Payer: Self-pay | Admitting: Neurology

## 2024-09-25 ENCOUNTER — Other Ambulatory Visit: Payer: Self-pay

## 2024-09-25 ENCOUNTER — Other Ambulatory Visit: Payer: Self-pay | Admitting: Nurse Practitioner

## 2024-09-25 ENCOUNTER — Other Ambulatory Visit (HOSPITAL_BASED_OUTPATIENT_CLINIC_OR_DEPARTMENT_OTHER): Payer: Self-pay

## 2024-09-25 ENCOUNTER — Ambulatory Visit: Admitting: Internal Medicine

## 2024-09-25 DIAGNOSIS — N471 Phimosis: Secondary | ICD-10-CM

## 2024-09-25 DIAGNOSIS — M2550 Pain in unspecified joint: Secondary | ICD-10-CM

## 2024-09-25 MED ORDER — SILDENAFIL CITRATE 100 MG PO TABS
100.0000 mg | ORAL_TABLET | ORAL | 6 refills | Status: AC | PRN
  Filled 2024-09-25: qty 8, 60d supply, fill #0

## 2024-09-25 MED ORDER — TIZANIDINE HCL 4 MG PO TABS
4.0000 mg | ORAL_TABLET | Freq: Two times a day (BID) | ORAL | 0 refills | Status: AC | PRN
  Filled 2024-09-25: qty 30, 15d supply, fill #0

## 2024-09-25 NOTE — Progress Notes (Unsigned)
 "   Name: Shaun [name]  MRN/ DOB: [id number], 07-21-1956    Age/ Sex: 69 y.o., male    PCP: Early, Camie BRAVO, NP   Reason for Endocrinology Evaluation: Subclinical hyperthyroidism     Date of Initial Endocrinology Evaluation: 01/16/2023    HPI: [name] is a 69 y.o. male with a past medical history of atrial fibrillation, CAD, HTN, CHF, dyslipidemia, DM. The patient presented for initial endocrinology clinic visit on 01/16/2023 for consultative assistance with his subclinical hyperthyroidism.   Patient has been noted with low TSH 03/2022 at <0.01 u IU/mL, with repeat TSH of 0.033u IU/mL 05/2022.  Of note the patient has had normal free T4 and T3   Patient follows with cardiology for A-fib, CAD, and CHF   He is not on amiodarone , except for amiodarone  infusion 07/2021    No Fh of thyroid  disease  TRAb negative  12/2022  Patient was started on methimazole  12/2022 with a TSH of 0.350 u IU/mL, normal free T4 and T3  SUBJECTIVE:    Today (09/25/24):  Shaun [name] is here for follow-up on hyperthyroidism.   Since his last visit here he underwent right hip arthroplasty in August, 2025  Patient continues to follow-up with cardiology for CAD , CHF, and A-fib, the patient was hospitalized in December, 2025 for influenza complicated by mild pulmonary edema He was evaluated by neurology for essential tremors  Weight continues to trend down, intentional  Denies local neck swelling  Has occasional palpitations  Continues with chronic tremors , these have been bothersome as he likes to work with his hands Denies diarrhea or loose stools but has rare constipation   Methimazole  5 mg, 4 days a week ( Monday, Wednesday , Friday and Saturday )    HISTORY:  Past Medical History:  Past Medical History:  Diagnosis Date   A-fib (HCC)    Anginal pain    Anxiety    Bacterial URI 08/29/2022   CAD (coronary artery disease)    DES to distal circumflex 2016   Cataract     CHF (congestive heart failure) (HCC)    Colon polyps    30 colon polyps found on first colonoscopy   Complication of anesthesia    COPD (chronic obstructive pulmonary disease) (HCC)    Depression    Diastolic heart failure (HCC)    Diverticulitis    DJD (degenerative joint disease)    Dyspnea    Dysrhythmia    A-Fib   Genetic testing 09/09/2014   Negative genetic testing on the ColoNext panel test and the MSH2 inversion testing.  The ColoNext gene panel offered by [facility] and includes sequencing and rearrangement analysis for the following 17 genes: APC, BMPR1A, CDH1, CHEK2, EPCAM, GREM1, MLH1, MSH2, MSH6, MUTYH, PMS2, POLD1, POLE, PTEN, SMAD4, STK11, and TP53.   The report date is 09/08/14.      GERD (gastroesophageal reflux disease)    History of kidney stones    Hyperlipidemia    Hypernatremia 08/22/2021   Hypertension    Insomnia    Obstructive sleep apnea 12/2009   01/26/2010 AHI 83/hr   Permanent atrial fibrillation (HCC)    Onset 2006 paroxysmal then progressive to persistent   Pneumonia    PONV (postoperative nausea and vomiting)    PUD (peptic ulcer disease)    1980s   RLS (restless legs syndrome)    Sinusitis    Skin cancer    Tremor    Type 2 diabetes mellitus (  [facility])    Past Surgical History:  Past Surgical History:  Procedure Laterality Date   BIOPSY  07/17/2018   Procedure: BIOPSY;  Surgeon: Harvey Margo CROME, MD;  [facility];  Service: Endoscopy;;  colon   BOWEL RESECTION  09/17/2018   SMALL BOWEL RESECTION: 71 CM    CARDIAC CATHETERIZATION N/A 07/21/2015   Procedure: Left Heart Cath and Coronary Angiography;  Surgeon: Peter M Jordan, MD;  Location: [facility] INVASIVE CV LAB;  Service: Cardiovascular;  Laterality: N/A;   CARDIAC CATHETERIZATION N/A 07/21/2015   Procedure: Coronary Stent Intervention;  Surgeon: Peter M Jordan, MD;  Location: [facility] INVASIVE CV LAB;  Service: Cardiovascular;  Laterality: N/A;   CIRCUMCISION N/A 04/05/2019   Procedure:  CIRCUMCISION ADULT;  Surgeon: Watt Rush, MD;  Location: [facility] ORS;  Service: Urology;  Laterality: N/A;   COLONOSCOPY N/A 05/19/2014   Dr. Sharla diverticulosis/moderate external hemorrhoids, >20 simple adenomas. Genetic screening negative.    COLONOSCOPY WITH PROPOFOL  N/A 07/17/2018   Dr. harvey: Diverticulosis, external/internal hemorrhoids, 32 colon polyps removed.  ten tubular adenomas removed with no high-grade dysplasia.  Advised to have surveillance colonoscopy in 3 years.   COLONOSCOPY WITH PROPOFOL  N/A 06/07/2021   Procedure: COLONOSCOPY WITH PROPOFOL ;  Surgeon: Cindie Carlin POUR, DO;  Location: [facility];  Service: Endoscopy;  Laterality: N/A;  9:30 / ASA 3  (Pt was told that his time will be given at Pre-op)   CORONARY PRESSURE/FFR STUDY Left 06/08/2017   Procedure: INTRAVASCULAR PRESSURE WIRE/FFR STUDY;  Surgeon: Mady Bruckner, MD;  Location: [facility] INVASIVE CV LAB;  Service: Cardiovascular;  Laterality: Left;  LAD and CFX   ESOPHAGOGASTRODUODENOSCOPY (EGD) WITH PROPOFOL  N/A 07/17/2018   Dr. harvey: Low-grade narrowing Schatzki ring at the GE junction status post dilation.  Gastritis.  Biopsy with mild nonspecific reactive gastropathy.  No H. pylori.   GIVENS CAPSULE STUDY N/A 06/24/2019   normal   HERNIA REPAIR  1986   Left inguinal   INCISIONAL HERNIA REPAIR N/A 10/19/2022   Procedure: OPEN INCISIONAL HERNIA REPAIR;  Surgeon: Dasie Leonor CROME, MD;  Location: [facility] OR;  Service: General;  Laterality: N/A;   INSERTION OF MESH N/A 10/19/2022   Procedure: INSERTION OF MESH;  Surgeon: Dasie Leonor CROME, MD;  Location: [facility] OR;  Service: General;  Laterality: N/A;   LAPAROTOMY N/A 09/17/2018   Procedure: EXPLORATORY LAPAROTOMY;  Surgeon: Kallie Manuelita BROCKS, MD;  Location: [facility];  Service: General;  Laterality: N/A;   LEFT HEART CATH AND CORONARY ANGIOGRAPHY N/A 06/08/2017   Procedure: LEFT HEART CATH AND CORONARY ANGIOGRAPHY;  Surgeon: Mady Bruckner, MD;  Location: [facility] INVASIVE CV LAB;   Service: Cardiovascular;  Laterality: N/A;   POLYPECTOMY  07/17/2018   Procedure: POLYPECTOMY;  Surgeon: Harvey Margo CROME, MD;  [facility];  Service: Endoscopy;;  colon   POLYPECTOMY  06/07/2021   Procedure: POLYPECTOMY INTESTINAL;  Surgeon: Cindie Carlin POUR, DO;  [facility];  Service: Endoscopy;;   RIGHT/LEFT HEART CATH AND CORONARY ANGIOGRAPHY N/A 09/19/2023   Procedure: RIGHT/LEFT HEART CATH AND CORONARY ANGIOGRAPHY;  Surgeon: Verlin Bruckner BIRCH, MD;  Location: [facility] INVASIVE CV LAB;  Service: Cardiovascular;  Laterality: N/A;   ROTATOR CUFF REPAIR     Right   SAVORY DILATION N/A 07/17/2018   Procedure: SAVORY DILATION;  Surgeon: Harvey Margo CROME, MD;  [facility];  Service: Endoscopy;  Laterality: N/A;   TOTAL HIP ARTHROPLASTY Right 04/19/2024   Procedure: ARTHROPLASTY, HIP, TOTAL, ANTERIOR APPROACH;  Surgeon: Vernetta,  Lonni GRADE, MD;  Location: [facility] ORS;  Service: Orthopedics;  Laterality: Right;  Needs RNFA    Social History:  reports that he quit smoking about 5 months ago. His smoking use included cigarettes. He has been exposed to tobacco smoke. He has never used smokeless tobacco. He reports that he does not currently use alcohol. He reports current drug use. Drug: Hydrocodone . Family History: family history includes Brain cancer in his maternal uncle; Breast cancer in his cousin; Breast cancer (age of onset: 31) in his sister; Breast cancer (age of onset: 72) in his mother; Cancer in his cousin and maternal uncle; Diabetes in his brother; Heart attack in his father; Hypertension in his mother; Parkinson's disease in his brother; Skin cancer (age of onset: 70) in his sister.   HOME MEDICATIONS: Allergies as of 09/25/2024   No Known Allergies      Medication List        Accurate as of September 25, 2024  7:05 AM. If you have any questions, ask your nurse or doctor.          rOPINIRole  3 MG tablet Commonly known as: REQUIP  Take 1 tablet  (3 mg total) by mouth at bedtime, AND 0.5 tablets (1.5 mg total) during the day for breakthrough symptoms. The timing of this medication is very important.   albuterol  108 (90 Base) MCG/ACT inhaler Commonly known as: Ventolin  HFA INHALE 2 PUFFS INTO THE LUNGS EVERY 6 HOURS AS NEEDED FOR WHEEZING OR SHORTNESS OF BREATH   albuterol  (2.5 MG/3ML) 0.083% nebulizer solution Commonly known as: PROVENTIL  Take 3 mLs (2.5 mg total) by nebulization every 6 (six) hours as needed for wheezing or shortness of breath.   ascorbic acid  500 MG tablet Commonly known as: VITAMIN C  Take 500 mg by mouth daily.   atorvastatin  80 MG tablet Commonly known as: LIPITOR  Take 1 tablet (80 mg total) by mouth once daily in the evening.  Please follow up in 2-3 months. (Take 1 tablet (80 mg total) by mouth every evening.)   buPROPion  150 MG 24 hr tablet Commonly known as: Wellbutrin  XL Take 3 tablets by mouth once daily. (Take 3 tablets (450 mg total) by mouth daily.)   docusate sodium  100 MG capsule Commonly known as: COLACE Take 1 capsule (100 mg total) by mouth daily as needed for mild constipation.   Eliquis  5 MG Tabs tablet Generic drug: apixaban  Take 1 tablet (5 mg total) by mouth 2 (two) times daily.   escitalopram  20 MG tablet Commonly known as: LEXAPRO  Take 20 mg by mouth in the morning.   ezetimibe  10 MG tablet Commonly known as: ZETIA  Take 1 tablet (10 mg total) by mouth daily.   FeroSul 325 (65 Fe) MG tablet Generic drug: ferrous sulfate  Take 1 tablet (325 mg total) by mouth 3 (three) times a week.   furosemide  40 MG tablet Commonly known as: LASIX  Take 1 tablet (40 mg total) by mouth daily. May also take additional ONE-HALF tablet (20 mg total) as needed if shortness of breath or swelling occurs.   HYDROcodone -acetaminophen  5-325 MG tablet Commonly known as: NORCO/VICODIN Take 1-2 tablets by mouth every 12 (twelve) hours as needed for moderate pain (pain score 4-6) or severe pain  (pain score 7-10).   hydrOXYzine  25 MG tablet Commonly known as: ATARAX  Take 1 tablet (25 mg total) by mouth at bedtime and may repeat dose one time if needed for sleep.   Jardiance  10 MG Tabs tablet Generic drug: empagliflozin  Take 10 mg by  mouth daily. Through [employer]   metFORMIN  500 MG tablet Commonly known as: GLUCOPHAGE  Take 1 tablet (500 mg total) by mouth 2 (two) times daily before a meal.   methimazole  5 MG tablet Commonly known as: TAPAZOLE  Take 1 tablet (5 mg total) by mouth as directed. 1 tablet 4 days a week   metoprolol  tartrate 100 MG tablet Commonly known as: LOPRESSOR  Take 1 tablet (100 mg total) by mouth 2 (two) times daily.   nitroGLYCERIN  0.4 MG SL tablet Commonly known as: NITROSTAT  Place 1 tablet (0.4 mg total) under the tongue every 5 (five) minutes as needed.   oseltamivir  75 MG capsule Commonly known as: TAMIFLU  Take 1 capsule (75 mg total) by mouth 2 (two) times daily.   pantoprazole  40 MG tablet Commonly known as: PROTONIX  Take 1 tablet (40 mg total) by mouth daily before breakfast.   PEG 3350  17 GM/SCOOP Powd Take one capful (17g) twice daily until soft stool, then continue one capful once daily as needed.   potassium chloride  SA 20 MEQ tablet Commonly known as: KLOR-CON  M Take 1 tablet (20 mEq total) by mouth daily.   sildenafil  100 MG tablet Commonly known as: VIAGRA  Take 1 tablet (100 mg total) by mouth as needed for erectile dysfunction. Do not take nitroglycerin  within 24 hours of last dose.   tiZANidine  4 MG tablet Commonly known as: ZANAFLEX  Take 1 tablet (4 mg total) by mouth every 12 (twelve) hours as needed for muscle spasms. Only take if pain is not under a 5 with other medications.   traZODone  150 MG tablet Commonly known as: DESYREL  Take 1 tablet (150 mg total) by mouth at bedtime.   Vitamin D  (Ergocalciferol ) 1.25 MG (50000 UNIT) Caps capsule Commonly known as: DRISDOL  Take 1 capsule (50,000 Units total) by mouth  every 7 (seven) days.          REVIEW OF SYSTEMS: A comprehensive ROS was conducted with the patient and is negative except as per HPI    OBJECTIVE:  VS: There were no vitals taken for this visit.   Wt Readings from Last 3 Encounters:  08/29/24 214 lb 6.4 oz (97.3 kg)  08/02/24 215 lb (97.5 kg)  05/10/24 212 lb (96.2 kg)     EXAM: General: Pt appears well and is in NAD  Neck: General: Supple without adenopathy. Thyroid : Thyroid  size normal.  No goiter or nodules appreciated.   Lungs: Clear with good BS bilat   Heart: Auscultation: RRR.  Abdomen: Soft, nontender  Extremities:  BL LE: No pretibial edema   Mental Status: Judgment, insight: Intact Orientation: Oriented to time, place, and person Mood and affect: No depression, anxiety, or agitation     DATA REVIEWED:    Latest Reference Range & Units 03/25/24 09:33  TSH 0.40 - 4.50 mIU/L 0.48  T4,Free(Direct) 0.8 - 1.8 ng/dL 1.3      Latest Reference Range & Units 03/31/23 11:32  Thyrotropin Receptor Ab 0.00 - 1.75 IU/L <1.10      Latest Reference Range & Units 08/29/24 05:41  Sodium 135 - 145 mmol/L 137  Potassium 3.5 - 5.1 mmol/L 4.3  Chloride 98 - 111 mmol/L 101  CO2 22 - 32 mmol/L 25  Glucose 70 - 99 mg/dL 703 (H)  BUN 8 - 23 mg/dL 17  Creatinine 9.38 - 8.75 mg/dL 9.15  Calcium  8.9 - 10.3 mg/dL 8.8 (L)  Anion gap 5 - 15  11  Phosphorus 2.5 - 4.6 mg/dL 2.9  Magnesium  1.7 -  2.4 mg/dL 2.4  GFR, Estimated >39 mL/min >60    01/16/2023 Thyrotropin Receptor Ab <1.10    Old records , labs and images have been reviewed.   ASSESSMENT/PLAN/RECOMMENDATIONS:   Subclinical Hyperthyroidism:  -Discussed differential diagnosis to include Graves' disease, autonomous thyroid  nodule -TRAb negative - No local neck symptoms  -We discussed the importance of euthyroidism in the setting of cardiac arrhythmia -TFTs remain within normal range, no change  Medication  Methimazole  5 mg, 1 tab Monday, Wednesday ,  Friday and Saturday     F/U in 6 months   Signed electronically by: Stefano Redgie Butts, MD  [facility]  [facility] [address] [place] [place], [place] [address] Phone: [phone] FAX: [phone]   CC: Oris Camie BRAVO, NP [address] [place] [address] Phone: [phone] Fax: [phone]   Return to Endocrinology clinic as below: Future Appointments  Date Time Provider Department Center  09/25/2024  8:50 AM Finbar Nippert, Donell Redgie, MD [facility] None  09/30/2024  2:30 PM Tat, Asberry RAMAN, DO [facility] None  10/09/2024  8:00 AM Ezzard Sonny RAMAN, PA-C [facility]  12/02/2024  8:30 AM Vernetta Lonni GRADE, MD [facility] None  12/24/2024  8:30 AM Early, Camie BRAVO, NP [facility] [id number]  01/09/2025  9:00 AM PFM-PHARMACIST [facility] [address]  05/20/2025  9:30 AM PFM-ANNUAL WELLNESS VISIT [facility] [address]         "

## 2024-09-25 NOTE — Telephone Encounter (Signed)
 Last appt 05/13/24

## 2024-09-26 ENCOUNTER — Other Ambulatory Visit (HOSPITAL_BASED_OUTPATIENT_CLINIC_OR_DEPARTMENT_OTHER): Payer: Self-pay

## 2024-09-27 ENCOUNTER — Other Ambulatory Visit: Payer: Self-pay | Admitting: Cardiology

## 2024-09-27 ENCOUNTER — Other Ambulatory Visit (HOSPITAL_BASED_OUTPATIENT_CLINIC_OR_DEPARTMENT_OTHER): Payer: Self-pay

## 2024-09-27 MED ORDER — METOPROLOL TARTRATE 100 MG PO TABS
100.0000 mg | ORAL_TABLET | Freq: Two times a day (BID) | ORAL | 0 refills | Status: AC
  Filled 2024-09-27: qty 60, 30d supply, fill #0

## 2024-09-27 NOTE — Progress Notes (Unsigned)
 "  Virtual Visit Via Video       Consent was obtained for video visit:  Yes.   Answered questions that patient had about telehealth interaction:  Yes.   I discussed the limitations, risks, security and privacy concerns of performing an evaluation and management service by telemedicine. I also discussed with the patient that there may be a patient responsible charge related to this service. The patient expressed understanding and agreed to proceed.  Pt location: Home Physician Location: office Name of referring provider:  Early, Camie BRAVO, NP I connected with Shaun [name] at patients initiation/request on 09/30/2024 at 10:15 AM EST by video enabled telemedicine application and verified that I am speaking with the correct person using two identifiers. Pt MRN:  [id number] Pt DOB:  08/24/1956 Video Participants:  Shaun [name];    Assessment/Plan:    Tremor - He does have a wrist component, but this is really from longstanding essential tremor             - Discussed with patient that his options for oral meds really are quite limited.  He is already on a beta-blocker (metoprolol ).  The any other first-line drug is primidone, and that interacts with the Eliquis , so he cannot be on that.  Topamax is considered first-line medication, but only in high dosages, which are often not tolerated in this age group.  I don't think that gabapentin will likely make a difference with this degree of tremor.             - Discussed surgical options in detail, including focused ultrasound and DBS.  He is not interested in DBS but may be in focused ultrasound.  I will send him some information in the mail, he will discuss with his wife and will let me know if he wants referral for consultation             - Discussed Danley Trio and information given on that last visit but doubt will be very impactful.             -decreasing caffeine  may be of value   2.  Tobacco abuse             - Discussed the  importance of tobacco cessation, especially if surgical interventions are considered.  Discussed in detail today and last visit.     Subjective:   Shaun [name] was seen today in follow up tremor.  I thought that this was likely longstanding essential tremor, but he did have a rest component and gait instability and family history of Parkinson's, so we did do a DaTscan .  This was normal.  Separately, patient was in the hospital December 31 for shortness of breath.  He was found to be tachycardic in A-fib with rapid ventricular response with rates in the 130s.  He had not taken his metoprolol  for several days because he had been taking care of his wife who had been sick with the flu.  It was presumed that he also had the flu and patient was treated as such.  He reports he is slowly getting better but not back to normal yet.  ALLERGIES:  No Known Allergies  CURRENT MEDICATIONS:  No outpatient medications have been marked as taking for the 09/30/24 encounter (Appointment) with Chivas Notz, Asberry RAMAN, DO.   Current Facility-Administered Medications for the 09/30/24 encounter (Appointment) with Kathyrn Warmuth, Asberry RAMAN, DO  Medication   lidocaine  (XYLOCAINE ) 1 % (with pres) injection 5 mL  Objective:    PHYSICAL EXAMINATION:    VITALS:   There were no vitals filed for this visit.   GEN:  The patient appears stated age and is in NAD. HEENT:  Normocephalic, atraumatic.    Neurological examination:  Orientation: The patient is alert and oriented x3. Cranial nerves: There is good facial symmetry. The speech is fluent and clear.    I have reviewed and interpreted the following labs independently   Chemistry      Component Value Date/Time   NA 137 08/29/2024 0541   NA 135 12/07/2023 0821   K 4.3 08/29/2024 0541   CL 101 08/29/2024 0541   CO2 25 08/29/2024 0541   BUN 17 08/29/2024 0541   BUN 13 12/07/2023 0821   CREATININE 0.84 08/29/2024 0541   CREATININE 0.92 12/15/2017 0744       Component Value Date/Time   CALCIUM  8.8 (L) 08/29/2024 0541   ALKPHOS 79 08/28/2024 1948   AST 26 08/28/2024 1948   ALT 29 08/28/2024 1948   BILITOT 0.2 08/28/2024 1948   BILITOT 0.5 12/07/2023 0821      Lab Results  Component Value Date   WBC 8.3 08/29/2024   HGB 14.0 08/29/2024   HCT 41.3 08/29/2024   MCV 82.6 08/29/2024   PLT 166 08/29/2024   Lab Results  Component Value Date   TSH 0.48 03/25/2024     Chemistry      Component Value Date/Time   NA 137 08/29/2024 0541   NA 135 12/07/2023 0821   K 4.3 08/29/2024 0541   CL 101 08/29/2024 0541   CO2 25 08/29/2024 0541   BUN 17 08/29/2024 0541   BUN 13 12/07/2023 0821   CREATININE 0.84 08/29/2024 0541   CREATININE 0.92 12/15/2017 0744      Component Value Date/Time   CALCIUM  8.8 (L) 08/29/2024 0541   ALKPHOS 79 08/28/2024 1948   AST 26 08/28/2024 1948   ALT 29 08/28/2024 1948   BILITOT 0.2 08/28/2024 1948   BILITOT 0.5 12/07/2023 0821     Follow up Instructions      -I discussed the assessment and treatment plan with the patient. The patient was provided an opportunity to ask questions and all were answered. The patient agreed with the plan and demonstrated an understanding of the instructions.   The patient was advised to call back or seek an in-person evaluation if the symptoms worsen or if the condition fails to improve as anticipated.      Asberry Schneider, DO  Cc:  Early, Camie BRAVO, NP  "

## 2024-09-30 ENCOUNTER — Telehealth: Admitting: Neurology

## 2024-09-30 DIAGNOSIS — G250 Essential tremor: Secondary | ICD-10-CM

## 2024-09-30 DIAGNOSIS — Z720 Tobacco use: Secondary | ICD-10-CM | POA: Diagnosis not present

## 2024-09-30 DIAGNOSIS — R251 Tremor, unspecified: Secondary | ICD-10-CM | POA: Diagnosis not present

## 2024-09-30 NOTE — Patient Instructions (Signed)
 We discussed today the options of deep brain stimulation and focused ultrasound.  You had some interest in focused ultrasound.  I will send you some information in the mail.  If interested we will send you a referral to Dr. Leellen at [facility].  Let us  know if you want us  to do that.  It was good to see you today.  Shaun Schneider, DO Director of Movement Disorders [employer]

## 2024-10-01 ENCOUNTER — Other Ambulatory Visit (HOSPITAL_BASED_OUTPATIENT_CLINIC_OR_DEPARTMENT_OTHER): Payer: Self-pay

## 2024-10-02 ENCOUNTER — Other Ambulatory Visit: Payer: Self-pay | Admitting: Acute Care

## 2024-10-02 DIAGNOSIS — Z87891 Personal history of nicotine dependence: Secondary | ICD-10-CM

## 2024-10-02 DIAGNOSIS — Z122 Encounter for screening for malignant neoplasm of respiratory organs: Secondary | ICD-10-CM

## 2024-10-02 DIAGNOSIS — F17210 Nicotine dependence, cigarettes, uncomplicated: Secondary | ICD-10-CM

## 2024-10-07 ENCOUNTER — Other Ambulatory Visit

## 2024-10-09 ENCOUNTER — Ambulatory Visit: Admitting: Gastroenterology

## 2024-11-26 ENCOUNTER — Ambulatory Visit: Admitting: Internal Medicine

## 2024-12-02 ENCOUNTER — Ambulatory Visit: Admitting: Orthopaedic Surgery

## 2024-12-10 ENCOUNTER — Ambulatory Visit: Admitting: Internal Medicine

## 2024-12-24 ENCOUNTER — Ambulatory Visit: Payer: Self-pay | Admitting: Nurse Practitioner

## 2025-01-09 ENCOUNTER — Other Ambulatory Visit

## 2025-05-20 ENCOUNTER — Ambulatory Visit: Payer: Self-pay
